# Patient Record
Sex: Male | Born: 1970 | Race: Black or African American | Hispanic: No | Marital: Single | Smoking: Never smoker
Health system: Southern US, Community
[De-identification: ages and names within clinical notes are randomized; demographics above are authoritative.]

## PROBLEM LIST (undated history)

## (undated) DIAGNOSIS — E785 Hyperlipidemia, unspecified: Secondary | ICD-10-CM

## (undated) DIAGNOSIS — G709 Myoneural disorder, unspecified: Secondary | ICD-10-CM

## (undated) DIAGNOSIS — E039 Hypothyroidism, unspecified: Secondary | ICD-10-CM

## (undated) DIAGNOSIS — E559 Vitamin D deficiency, unspecified: Secondary | ICD-10-CM

## (undated) DIAGNOSIS — E11359 Type 2 diabetes mellitus with proliferative diabetic retinopathy without macular edema: Secondary | ICD-10-CM

## (undated) DIAGNOSIS — E109 Type 1 diabetes mellitus without complications: Secondary | ICD-10-CM

## (undated) DIAGNOSIS — E291 Testicular hypofunction: Secondary | ICD-10-CM

## (undated) DIAGNOSIS — I10 Essential (primary) hypertension: Secondary | ICD-10-CM

## (undated) DIAGNOSIS — G35 Multiple sclerosis: Secondary | ICD-10-CM

## (undated) HISTORY — DX: Type 1 diabetes mellitus without complications: E10.9

## (undated) HISTORY — DX: Hypothyroidism, unspecified: E03.9

## (undated) HISTORY — DX: Hyperlipidemia, unspecified: E78.5

## (undated) HISTORY — DX: Multiple sclerosis: G35

## (undated) HISTORY — DX: Myoneural disorder, unspecified: G70.9

## (undated) HISTORY — DX: Essential (primary) hypertension: I10

## (undated) HISTORY — PX: FRACTURE SURGERY: SHX138

## (undated) HISTORY — DX: Type 2 diabetes mellitus with proliferative diabetic retinopathy without macular edema: E11.359

## (undated) HISTORY — PX: EYE SURGERY: SHX253

## (undated) HISTORY — PX: RETINAL LASER PROCEDURE: SHX2339

---

## 1999-06-18 ENCOUNTER — Emergency Department (HOSPITAL_COMMUNITY): Admission: EM | Admit: 1999-06-18 | Discharge: 1999-06-18 | Payer: Self-pay | Admitting: Emergency Medicine

## 2001-01-24 ENCOUNTER — Inpatient Hospital Stay (HOSPITAL_COMMUNITY): Admission: EM | Admit: 2001-01-24 | Discharge: 2001-01-25 | Payer: Self-pay | Admitting: Emergency Medicine

## 2004-04-14 ENCOUNTER — Ambulatory Visit: Payer: Self-pay | Admitting: Family Medicine

## 2004-04-18 ENCOUNTER — Encounter: Admission: RE | Admit: 2004-04-18 | Discharge: 2004-04-18 | Payer: Self-pay | Admitting: Family Medicine

## 2004-05-05 ENCOUNTER — Ambulatory Visit (HOSPITAL_COMMUNITY): Admission: RE | Admit: 2004-05-05 | Discharge: 2004-05-05 | Payer: Self-pay | Admitting: Neurology

## 2004-09-15 ENCOUNTER — Ambulatory Visit: Payer: Self-pay | Admitting: Internal Medicine

## 2005-07-20 ENCOUNTER — Emergency Department (HOSPITAL_COMMUNITY): Admission: EM | Admit: 2005-07-20 | Discharge: 2005-07-20 | Payer: Self-pay | Admitting: Emergency Medicine

## 2005-08-17 ENCOUNTER — Ambulatory Visit: Payer: Self-pay | Admitting: Endocrinology

## 2005-09-03 ENCOUNTER — Ambulatory Visit: Payer: Self-pay | Admitting: Endocrinology

## 2005-09-12 ENCOUNTER — Ambulatory Visit: Payer: Self-pay | Admitting: Endocrinology

## 2005-09-14 ENCOUNTER — Ambulatory Visit: Payer: Self-pay | Admitting: Endocrinology

## 2005-12-28 ENCOUNTER — Ambulatory Visit: Payer: Self-pay | Admitting: Endocrinology

## 2006-01-29 ENCOUNTER — Ambulatory Visit: Payer: Self-pay | Admitting: Endocrinology

## 2006-01-31 ENCOUNTER — Ambulatory Visit: Payer: Self-pay | Admitting: Internal Medicine

## 2006-05-24 ENCOUNTER — Ambulatory Visit: Payer: Self-pay | Admitting: Endocrinology

## 2006-05-24 LAB — CONVERTED CEMR LAB
ALT: 83 units/L — ABNORMAL HIGH (ref 0–40)
AST: 63 units/L — ABNORMAL HIGH (ref 0–37)
Albumin: 4.1 g/dL (ref 3.5–5.2)
Alkaline Phosphatase: 92 units/L (ref 39–117)
BUN: 18 mg/dL (ref 6–23)
Bacteria, U Microscopic: NEGATIVE /hpf
Basophils Absolute: 0.1 10*3/uL (ref 0.0–0.1)
Basophils Relative: 0.7 % (ref 0.0–1.0)
Bilirubin Urine: NEGATIVE
CO2: 29 meq/L (ref 19–32)
Calcium: 9.7 mg/dL (ref 8.4–10.5)
Chloride: 101 meq/L (ref 96–112)
Chol/HDL Ratio, serum: 2.6
Cholesterol: 137 mg/dL (ref 0–200)
Creatinine, Ser: 1.3 mg/dL (ref 0.4–1.5)
Creatinine,U: 119.9 mg/dL
Crystals: NEGATIVE
Eosinophil percent: 9.1 % — ABNORMAL HIGH (ref 0.0–5.0)
Epithelial cells, urine: NEGATIVE /lpf
GFR calc non Af Amer: 68 mL/min
Glomerular Filtration Rate, Af Am: 82 mL/min/{1.73_m2}
Glucose, Bld: 147 mg/dL — ABNORMAL HIGH (ref 70–99)
HCT: 42.3 % (ref 39.0–52.0)
HDL: 52.2 mg/dL (ref >39.0)
Hemoglobin, Urine: NEGATIVE
Hemoglobin: 14.5 g/dL (ref 13.0–17.0)
Hgb A1c MFr Bld: 10.2 % — ABNORMAL HIGH (ref 4.6–6.0)
Ketones, ur: NEGATIVE mg/dL
LDL Cholesterol: 74 mg/dL (ref 0–99)
Leukocytes, UA: NEGATIVE
Lymphocytes Relative: 18.4 % (ref 12.0–46.0)
MCHC: 34.4 g/dL (ref 30.0–36.0)
MCV: 86.1 fL (ref 78.0–100.0)
Microalb Creat Ratio: 190.2 mg/g — ABNORMAL HIGH (ref 0.0–30.0)
Microalb, Ur: 22.8 mg/dL — ABNORMAL HIGH (ref 0.0–1.9)
Monocytes Absolute: 0.7 10*3/uL (ref 0.2–0.7)
Monocytes Relative: 8.6 % (ref 3.0–11.0)
Mucus, UA: NEGATIVE
Neutro Abs: 5.2 10*3/uL (ref 1.4–7.7)
Neutrophils Relative %: 63.2 % (ref 43.0–77.0)
Nitrite: NEGATIVE
Platelets: 232 10*3/uL (ref 150–400)
Potassium: 4.8 meq/L (ref 3.5–5.1)
RBC / HPF: NONE SEEN
RBC: 4.91 M/uL (ref 4.22–5.81)
RDW: 12.3 % (ref 11.5–14.6)
Sodium: 138 meq/L (ref 135–145)
Specific Gravity, Urine: 1.015 (ref 1.000–1.03)
TSH: 11.21 microintl units/mL — ABNORMAL HIGH (ref 0.35–5.50)
Total Bilirubin: 0.7 mg/dL (ref 0.3–1.2)
Total Protein, Urine: 30 mg/dL — AB
Total Protein: 7.2 g/dL (ref 6.0–8.3)
Triglyceride fasting, serum: 55 mg/dL (ref 0–149)
Urine Glucose: NEGATIVE mg/dL
Urobilinogen, UA: 0.2 (ref 0.0–1.0)
VLDL: 11 mg/dL (ref 0–40)
WBC: 8.3 10*3/uL (ref 4.5–10.5)
pH: 6 (ref 5.0–8.0)

## 2006-05-30 ENCOUNTER — Ambulatory Visit: Payer: Self-pay | Admitting: Endocrinology

## 2006-07-31 ENCOUNTER — Ambulatory Visit: Payer: Self-pay | Admitting: Endocrinology

## 2006-07-31 LAB — CONVERTED CEMR LAB
HCV Ab: NEGATIVE
Hepatitis B Surface Ag: NEGATIVE
TSH: 9.69 microintl units/mL — ABNORMAL HIGH (ref 0.35–5.50)

## 2006-08-02 ENCOUNTER — Ambulatory Visit: Payer: Self-pay | Admitting: Endocrinology

## 2006-11-16 ENCOUNTER — Emergency Department (HOSPITAL_COMMUNITY): Admission: EM | Admit: 2006-11-16 | Discharge: 2006-11-16 | Payer: Self-pay | Admitting: Emergency Medicine

## 2006-11-20 ENCOUNTER — Encounter: Admission: RE | Admit: 2006-11-20 | Discharge: 2006-11-20 | Payer: Self-pay | Admitting: Urology

## 2006-12-19 ENCOUNTER — Ambulatory Visit: Payer: Self-pay | Admitting: Endocrinology

## 2006-12-19 LAB — CONVERTED CEMR LAB: Hgb A1c MFr Bld: 11.2 % — ABNORMAL HIGH (ref 4.6–6.0)

## 2006-12-25 ENCOUNTER — Encounter: Payer: Self-pay | Admitting: Endocrinology

## 2006-12-25 DIAGNOSIS — I10 Essential (primary) hypertension: Secondary | ICD-10-CM | POA: Insufficient documentation

## 2007-01-13 ENCOUNTER — Ambulatory Visit (HOSPITAL_COMMUNITY): Admission: RE | Admit: 2007-01-13 | Discharge: 2007-01-13 | Payer: Self-pay | Admitting: Ophthalmology

## 2007-06-15 ENCOUNTER — Emergency Department (HOSPITAL_COMMUNITY): Admission: EM | Admit: 2007-06-15 | Discharge: 2007-06-16 | Payer: Self-pay | Admitting: Emergency Medicine

## 2007-06-16 ENCOUNTER — Encounter: Payer: Self-pay | Admitting: Endocrinology

## 2007-08-29 ENCOUNTER — Ambulatory Visit: Payer: Self-pay | Admitting: Endocrinology

## 2007-08-29 DIAGNOSIS — E11359 Type 2 diabetes mellitus with proliferative diabetic retinopathy without macular edema: Secondary | ICD-10-CM | POA: Insufficient documentation

## 2007-08-29 DIAGNOSIS — G35 Multiple sclerosis: Secondary | ICD-10-CM | POA: Insufficient documentation

## 2007-08-29 DIAGNOSIS — E113599 Type 2 diabetes mellitus with proliferative diabetic retinopathy without macular edema, unspecified eye: Secondary | ICD-10-CM | POA: Insufficient documentation

## 2007-08-29 LAB — CONVERTED CEMR LAB
Cholesterol, target level: 200 mg/dL
HDL goal, serum: 40 mg/dL
LDL Goal: 100 mg/dL

## 2007-11-11 ENCOUNTER — Telehealth: Payer: Self-pay | Admitting: Endocrinology

## 2007-11-12 ENCOUNTER — Ambulatory Visit: Payer: Self-pay | Admitting: Endocrinology

## 2007-11-12 DIAGNOSIS — E7800 Pure hypercholesterolemia, unspecified: Secondary | ICD-10-CM | POA: Insufficient documentation

## 2007-11-12 DIAGNOSIS — E039 Hypothyroidism, unspecified: Secondary | ICD-10-CM | POA: Insufficient documentation

## 2007-11-12 LAB — CONVERTED CEMR LAB
ALT: 25 units/L (ref 0–53)
AST: 30 units/L (ref 0–37)
Albumin: 4 g/dL (ref 3.5–5.2)
Alkaline Phosphatase: 75 units/L (ref 39–117)
BUN: 18 mg/dL (ref 6–23)
Bilirubin, Direct: 0.2 mg/dL (ref 0.0–0.3)
CO2: 29 meq/L (ref 19–32)
Calcium: 9.4 mg/dL (ref 8.4–10.5)
Chloride: 102 meq/L (ref 96–112)
Cholesterol: 143 mg/dL (ref 0–200)
Creatinine, Ser: 1.1 mg/dL (ref 0.4–1.5)
GFR calc Af Amer: 98 mL/min
GFR calc non Af Amer: 81 mL/min
Glucose, Bld: 281 mg/dL — ABNORMAL HIGH (ref 70–99)
HDL: 43.6 mg/dL (ref >39.0)
Hgb A1c MFr Bld: 11.5 % — ABNORMAL HIGH (ref 4.6–6.0)
LDL Cholesterol: 90 mg/dL (ref 0–99)
Potassium: 4.4 meq/L (ref 3.5–5.1)
Sodium: 138 meq/L (ref 135–145)
TSH: 3.88 microintl units/mL (ref 0.35–5.50)
Total Bilirubin: 0.9 mg/dL (ref 0.3–1.2)
Total CHOL/HDL Ratio: 3.3
Total Protein: 7.2 g/dL (ref 6.0–8.3)
Triglycerides: 48 mg/dL (ref 0–149)
VLDL: 10 mg/dL (ref 0–40)

## 2007-12-03 ENCOUNTER — Ambulatory Visit: Payer: Self-pay | Admitting: Endocrinology

## 2007-12-03 DIAGNOSIS — E1065 Type 1 diabetes mellitus with hyperglycemia: Secondary | ICD-10-CM | POA: Insufficient documentation

## 2007-12-03 DIAGNOSIS — IMO0002 Reserved for concepts with insufficient information to code with codable children: Secondary | ICD-10-CM | POA: Insufficient documentation

## 2008-03-26 ENCOUNTER — Encounter: Payer: Self-pay | Admitting: Endocrinology

## 2008-04-07 ENCOUNTER — Ambulatory Visit: Payer: Self-pay | Admitting: Endocrinology

## 2008-04-07 LAB — CONVERTED CEMR LAB: Hgb A1c MFr Bld: 11.2 % — ABNORMAL HIGH (ref 4.6–6.0)

## 2008-05-06 ENCOUNTER — Encounter: Payer: Self-pay | Admitting: Endocrinology

## 2008-05-07 ENCOUNTER — Telehealth: Payer: Self-pay | Admitting: Endocrinology

## 2008-08-10 ENCOUNTER — Encounter (HOSPITAL_COMMUNITY): Admission: RE | Admit: 2008-08-10 | Discharge: 2008-11-08 | Payer: Self-pay | Admitting: Urology

## 2008-08-16 ENCOUNTER — Ambulatory Visit: Payer: Self-pay | Admitting: Endocrinology

## 2008-08-17 ENCOUNTER — Telehealth (INDEPENDENT_AMBULATORY_CARE_PROVIDER_SITE_OTHER): Payer: Self-pay | Admitting: *Deleted

## 2008-08-18 ENCOUNTER — Encounter: Payer: Self-pay | Admitting: Endocrinology

## 2008-11-30 ENCOUNTER — Telehealth (INDEPENDENT_AMBULATORY_CARE_PROVIDER_SITE_OTHER): Payer: Self-pay | Admitting: *Deleted

## 2008-12-27 ENCOUNTER — Ambulatory Visit: Payer: Self-pay | Admitting: Endocrinology

## 2008-12-27 DIAGNOSIS — M25519 Pain in unspecified shoulder: Secondary | ICD-10-CM | POA: Insufficient documentation

## 2008-12-29 ENCOUNTER — Telehealth: Payer: Self-pay | Admitting: Endocrinology

## 2009-01-07 ENCOUNTER — Telehealth: Payer: Self-pay | Admitting: Endocrinology

## 2009-01-25 ENCOUNTER — Telehealth: Payer: Self-pay | Admitting: Endocrinology

## 2009-02-21 ENCOUNTER — Encounter: Payer: Self-pay | Admitting: Endocrinology

## 2009-03-03 ENCOUNTER — Encounter: Payer: Self-pay | Admitting: Endocrinology

## 2009-07-01 ENCOUNTER — Telehealth: Payer: Self-pay | Admitting: Endocrinology

## 2009-08-22 ENCOUNTER — Ambulatory Visit: Payer: Self-pay | Admitting: Endocrinology

## 2009-08-22 LAB — CONVERTED CEMR LAB: Hgb A1c MFr Bld: 11.3 % — ABNORMAL HIGH (ref 4.6–6.5)

## 2009-09-09 ENCOUNTER — Ambulatory Visit: Payer: Self-pay | Admitting: Endocrinology

## 2009-10-07 ENCOUNTER — Ambulatory Visit: Payer: Self-pay | Admitting: Endocrinology

## 2009-10-07 DIAGNOSIS — R21 Rash and other nonspecific skin eruption: Secondary | ICD-10-CM | POA: Insufficient documentation

## 2009-10-18 ENCOUNTER — Encounter: Admission: RE | Admit: 2009-10-18 | Discharge: 2009-10-18 | Payer: Self-pay | Admitting: Endocrinology

## 2009-10-25 ENCOUNTER — Encounter: Payer: Self-pay | Admitting: Endocrinology

## 2009-10-25 ENCOUNTER — Emergency Department (HOSPITAL_COMMUNITY): Admission: EM | Admit: 2009-10-25 | Discharge: 2009-10-25 | Payer: Self-pay | Admitting: Emergency Medicine

## 2009-11-22 ENCOUNTER — Encounter: Payer: Self-pay | Admitting: Endocrinology

## 2009-12-14 ENCOUNTER — Encounter: Payer: Self-pay | Admitting: Endocrinology

## 2010-06-25 LAB — CONVERTED CEMR LAB
ALT: 31 units/L (ref 0–53)
AST: 32 units/L (ref 0–37)
Albumin: 4.3 g/dL (ref 3.5–5.2)
Alkaline Phosphatase: 74 units/L (ref 39–117)
BUN: 19 mg/dL (ref 6–23)
Bilirubin Urine: NEGATIVE
Bilirubin, Direct: 0.2 mg/dL (ref 0.0–0.3)
CO2: 33 meq/L — ABNORMAL HIGH (ref 19–32)
Calcium: 9.5 mg/dL (ref 8.4–10.5)
Chloride: 104 meq/L (ref 96–112)
Cholesterol: 132 mg/dL (ref 0–200)
Creatinine, Ser: 1.2 mg/dL (ref 0.4–1.5)
Creatinine,U: 166.4 mg/dL
GFR calc non Af Amer: 87.91 mL/min (ref >60)
Glucose, Bld: 192 mg/dL — ABNORMAL HIGH (ref 70–99)
HDL: 43.6 mg/dL (ref >39.00)
Hemoglobin, Urine: NEGATIVE
Hgb A1c MFr Bld: 10.3 % — ABNORMAL HIGH (ref 4.6–6.5)
Ketones, ur: NEGATIVE mg/dL
LDL Cholesterol: 82 mg/dL (ref 0–99)
Leukocytes, UA: NEGATIVE
Microalb Creat Ratio: 31.3 mg/g — ABNORMAL HIGH (ref 0.0–30.0)
Microalb, Ur: 5.2 mg/dL — ABNORMAL HIGH (ref 0.0–1.9)
Nitrite: NEGATIVE
Potassium: 4.5 meq/L (ref 3.5–5.1)
Sodium: 142 meq/L (ref 135–145)
Specific Gravity, Urine: 1.02 (ref 1.000–1.030)
TSH: 4.29 microintl units/mL (ref 0.35–5.50)
Total Bilirubin: 0.7 mg/dL (ref 0.3–1.2)
Total CHOL/HDL Ratio: 3
Total Protein, Urine: NEGATIVE mg/dL
Total Protein: 7.3 g/dL (ref 6.0–8.3)
Triglycerides: 33 mg/dL (ref 0.0–149.0)
Urine Glucose: >=1000 mg/dL
Urobilinogen, UA: 1 (ref 0.0–1.0)
VLDL: 6.6 mg/dL (ref 0.0–40.0)
pH: 6.5 (ref 5.0–8.0)

## 2010-06-29 NOTE — Assessment & Plan Note (Signed)
Summary: FU ON INSULIN /NWS   Vital Signs:  Patient profile:   40 year old male Height:      71 inches (180.34 cm) Weight:      193.25 pounds (87.84 kg) BMI:     27.05 O2 Sat:      98 % on Room air Temp:     98.9 degrees F (37.17 degrees C) oral Pulse rate:   82 / minute BP sitting:   110 / 72  (left arm) Cuff size:   large  Vitals Entered By: [name] RMA (Oct 07, 2009 8:01 AM)  O2 Flow:  Room air CC: Follow-up visit on insulin/ CF Is Patient Diabetic? Yes   CC:  Follow-up visit on insulin/ CF.  History of Present Illness: pt states he feels well in general.  he brings a record of his cbg's which i have reviewed today.  he has mild hypoglycemia approx 2/week--it happens when he misses a meal, or with activity in his yard.  pt states he feels well in general, except for fatigue. nodule on the left arm persists.  Current Medications (verified): 1)  Pravachol 40 Mg  Tabs (Pravastatin Sodium) .... 2 Qd 2)  Synthroid 50 Mcg  Tabs (Levothyroxine Sodium) .... Qd 3)  Zestoretic 20-12.5 Mg  Tabs (Lisinopril-Hydrochlorothiazide) .... Qd 4)  One Touch Glucose Test Strips, and Lancets .... Qid 5)  Lidex 0.05 % Crea (Fluocinonide) .... Three Times A Day As Needed Rash 6)  Prestige Blood Glucose Monitor  Misc (Blood Glucose Monitoring Suppl) .... As Dir 7)  Prestige Test  Strp (Glucose Blood) .... Two Times A Day, and Lancets 250.01 8)  Bd Insulin Syringe Ultrafine 31g X 5/16" 0.3 Ml Misc (Insulin Syringe-Needle U-100) .Marland Kitchen.. 1 Syringe Three Times A Day 9)  Prodigy No Coding Blood Gluc  Strp (Glucose Blood) .... Use As Directed 10)  Levemir 100 Unit/ml Soln (Insulin Detemir) .... 55 Units Each Am  Allergies (verified): No Known Drug Allergies  Past History:  Past Medical History: Last updated: 12/03/2007 DM Nepharopathy ABNL CXR HEPATOTOXICITY, DRUG-INDUCED, RISK OF (ICD-V58.69) HYPERCHOLESTEROLEMIA (ICD-272.0) HYPOTHYROIDISM (ICD-244.9) PROLIFERATIVE DIABETIC RETINOPATHY  (ICD-362.02) MULTIPLE SCLEROSIS (ICD-340) HYPERTENSION (ICD-401.9) DIABETES MELLITUS, TYPE I (ICD-250.01)  Review of Systems  The patient denies syncope.    Physical Exam  General:  normal appearance.   Psych:  Alert and cooperative; normal mood and affect; normal attention span and concentration.     Impression & Recommendations:  Problem # 1:  DIABETES MELLITUS, TYPE I, UNCONTROLLED, W/RENAL COMPS (ICD-250.43) he needs a variable dosage to meet his changing needs, especially due to exercise.  Problem # 2:  RASH AND OTHER NONSPECIFIC SKIN ERUPTION (ICD-782.1)  Medications Added to Medication List This Visit: 1)  Levemir 100 Unit/ml Soln (Insulin detemir) .... 60 units each am  Other Orders: Est. Patient Level III (16109) Dermatology Referral (Derma)  Patient Instructions: 1)  change levemir to 60 units each am.  however, take only 50 units if you are going to be active that day. 2)  Please schedule a follow-up appointment in 1 month. 3)  refer derm

## 2010-06-29 NOTE — Letter (Signed)
[facility]   Imported By: Lester [place] 11/02/2009 07:58:48  _____________________________________________________________________  External Attachment:    Type:   Image     Comment:   External Document

## 2010-06-29 NOTE — Letter (Signed)
Summary: [facility]  [facility]   Imported By: Lester [place] 12/01/2009 09:55:05  _____________________________________________________________________  External Attachment:    Type:   Image     Comment:   External Document

## 2010-06-29 NOTE — Assessment & Plan Note (Signed)
Summary: FU / EAR INFECTION? /NWS   Vital Signs:  Patient profile:   40 year old male Height:      71 inches (180.34 cm) Weight:      203.50 pounds (92.50 kg) O2 Sat:      98 % on Room air Temp:     98.0 degrees F (36.67 degrees C) oral Pulse rate:   77 / minute BP sitting:   124 / 60  (left arm) Cuff size:   regular  Vitals Entered ByMarland Kitchen Orlan Leavens (August 22, 2009 3:13 PM)Sherese Christopher SMA  O2 Flow:  Room air CC: Follow up/possible ear infection (R)//[place]   CC:  Follow up/possible ear infection (R)//[place].  History of Present Illness: pt states moderate pain at the right ear x 2 weeks.  no associated nasal congestion.   no cbg record, but states cbg's are low approx 3/month.   it happens during the day, after a small meal.  it is highest in am.  Current Medications (verified): 1)  Pravachol 40 Mg  Tabs (Pravastatin Sodium) .... 2 Qd 2)  Synthroid 50 Mcg  Tabs (Levothyroxine Sodium) .... Qd 3)  Zestoretic 20-12.5 Mg  Tabs (Lisinopril-Hydrochlorothiazide) .... Qd 4)  One Touch Glucose Test Strips, and Lancets .... Qid 5)  Lidex 0.05 % Crea (Fluocinonide) .... Three Times A Day As Needed Rash 6)  Humalog Mix 75/25 75-25 % Susp (Insulin Lispro Prot & Lispro) .... 45 Units Qam,   10 Units Qpm 7)  Prestige Blood Glucose Monitor  Misc (Blood Glucose Monitoring Suppl) .... As Dir 8)  Prestige Test  Strp (Glucose Blood) .... Two Times A Day, and Lancets 250.01 9)  Bd Insulin Syringe Ultrafine 31g X 5/16" 0.3 Ml Misc (Insulin Syringe-Needle U-100) .Marland Kitchen.. 1 Syringe Three Times A Day  Allergies (verified): No Known Drug Allergies  Past History:  Past Medical History: Last updated: 12/03/2007 DM Nepharopathy ABNL CXR HEPATOTOXICITY, DRUG-INDUCED, RISK OF (ICD-V58.69) HYPERCHOLESTEROLEMIA (ICD-272.0) HYPOTHYROIDISM (ICD-244.9) PROLIFERATIVE DIABETIC RETINOPATHY (ICD-362.02) MULTIPLE SCLEROSIS (ICD-340) HYPERTENSION (ICD-401.9) DIABETES MELLITUS, TYPE I (ICD-250.01)  Review of  Systems  The patient denies dyspnea on exertion.         denies sore throat.  Physical Exam  General:  normal appearance.   Head:  head: no deformity eyes: no periorbital swelling, no proptosis external nose and ears are normal mouth: no lesion seen Ears:  both tm's are mildly red Additional Exam:  Hemoglobin A1C       [H]  11.3 %    Impression & Recommendations:  Problem # 1:  DIABETES MELLITUS, TYPE I, UNCONTROLLED, W/RENAL COMPS (ICD-250.43) poor control.  he may do better with a simpler regimen  Problem # 2:  uri new  Medications Added to Medication List This Visit: 1)  Humalog Mix 75/25 75-25 % Susp (Insulin lispro prot & lispro) .... 40 units qam,   20 units qpm 2)  Azithromycin 500 Mg Tabs (Azithromycin) .Marland Kitchen.. 1 once daily  Other Orders: Diabetic Clinic Referral (Diabetic) TLB-A1C / Hgb A1C (Glycohemoglobin) (83036-A1C) Est. Patient Level IV (16109)  Patient Instructions: 1)  azithromycin 500 mg once daily 2)  change humalog 75/25 to 40 units am and 20 units before supper. 3)  check your blood sugar 2 times a day.  vary the time of day when you check, between before the 3 meals, and at bedtime.  also check if you have symptoms of your blood sugar being too high or too low.  please keep a record of the readings and  bring it to your next appointment here.  please call us sooner if you are having low blood sugar episodes. 4)  tests are being ordered for you today.  a few days after the test(s), please call [phone] to hear your test results.  5)  Please schedule a follow-up appointment in 3 months. 6)  (update: i left message on phone-tree:  i offered change to qam levemir.  ret few weeks with cbg record, if you decide to change insulin or not). 7)  refer dietician Prescriptions: AZITHROMYCIN 500 MG TABS (AZITHROMYCIN) 1 once daily  #6 x 0   Entered and Authorized by:   Minus Breeding MD   Signed by:   Minus Breeding MD on 08/22/2009   Method used:   Electronically to         [facility] [phone]* (retail)       [address]       [place], [place]  [address]       Ph: [phone]       Fax: [phone]   RxID:   [id number]
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
## 2010-06-29 NOTE — Letter (Signed)
Summary: [facility]  [facility]   Imported By: Lester  12/28/2009 08:41:23  _____________________________________________________________________  External Attachment:    Type:   Image     Comment:   External Document

## 2010-06-29 NOTE — Progress Notes (Signed)
  Phone Note Refill Request Message from:  Fax from Pharmacy on July 01, 2009 1:16 PM  Refills Requested: Medication #1:  PRAVACHOL 40 MG  TABS 2 qd   Dosage confirmed as above?Dosage Confirmed Initial call taken by: [name] CMA,  July 01, 2009 1:16 PM    Prescriptions: PRAVACHOL 40 MG  TABS (PRAVASTATIN SODIUM) 2 qd  #90 Each x 0   Entered by:   [name] CMA   Authorized by:   Minus Breeding MD   Signed by:   [name] CMA on 07/01/2009   Method used:   Electronically to        [facility] [phone]* (retail)       [address]       [place], [place]  [address]       Ph: [phone]       Fax: [phone]   RxID:   [id number]

## 2010-06-29 NOTE — Assessment & Plan Note (Signed)
Summary: FOLLOW UP-LB  #   Vital Signs:  Patient profile:   40 year old male Height:      71 inches (180.34 cm) Weight:      200.25 pounds (91.02 kg) O2 Sat:      98 % on Room air Temp:     96.9 degrees F (36.06 degrees C) oral Pulse rate:   86 / minute BP sitting:   118 / 82  (left arm) Cuff size:   regular  Vitals Entered By: [name] RMA (September 09, 2009 8:01 AM)  O2 Flow:  Room air CC: Follow-up visit/ pt states he is done with the Azithromycin/ CF Is Patient Diabetic? Yes   CC:  Follow-up visit/ pt states he is done with the Azithromycin/ CF.  History of Present Illness: pt states he feels well in general.  he brings a record of his cbg's which i have reviewed today.  it is extremely variable, with no trend throughout the day.    Current Medications (verified): 1)  Pravachol 40 Mg  Tabs (Pravastatin Sodium) .... 2 Qd 2)  Synthroid 50 Mcg  Tabs (Levothyroxine Sodium) .... Qd 3)  Zestoretic 20-12.5 Mg  Tabs (Lisinopril-Hydrochlorothiazide) .... Qd 4)  One Touch Glucose Test Strips, and Lancets .... Qid 5)  Lidex 0.05 % Crea (Fluocinonide) .... Three Times A Day As Needed Rash 6)  Humalog Mix 75/25 75-25 % Susp (Insulin Lispro Prot & Lispro) .... 40 Units Qam,   20 Units Qpm 7)  Prestige Blood Glucose Monitor  Misc (Blood Glucose Monitoring Suppl) .... As Dir 8)  Prestige Test  Strp (Glucose Blood) .... Two Times A Day, and Lancets 250.01 9)  Bd Insulin Syringe Ultrafine 31g X 5/16" 0.3 Ml Misc (Insulin Syringe-Needle U-100) .Marland Kitchen.. 1 Syringe Three Times A Day 10)  Azithromycin 500 Mg Tabs (Azithromycin) .Marland Kitchen.. 1 Once Daily 11)  Prodigy No Coding Blood Gluc  Strp (Glucose Blood) .... Use As Directed  Allergies (verified): No Known Drug Allergies  Past History:  Past Medical History: Last updated: 12/03/2007 DM Nepharopathy ABNL CXR HEPATOTOXICITY, DRUG-INDUCED, RISK OF (ICD-V58.69) HYPERCHOLESTEROLEMIA (ICD-272.0) HYPOTHYROIDISM (ICD-244.9) PROLIFERATIVE DIABETIC  RETINOPATHY (ICD-362.02) MULTIPLE SCLEROSIS (ICD-340) HYPERTENSION (ICD-401.9) DIABETES MELLITUS, TYPE I (ICD-250.01)  Social History: Reviewed history from 08/29/2007 and no changes required. single  unemployed was declared disabled 2011  Review of Systems  The patient denies syncope.    Physical Exam  General:  normal appearance.   Psych:  Alert and cooperative; normal mood and affect; normal attention span and concentration.     Impression & Recommendations:  Problem # 1:  DIABETES MELLITUS, TYPE I, UNCONTROLLED, W/RENAL COMPS (ICD-250.43) he needs a simpler regimen  Medications Added to Medication List This Visit: 1)  Levemir 100 Unit/ml Soln (Insulin detemir) .... 55 units each am  Other Orders: Est. Patient Level III (78295)  Patient Instructions: 1)  change current insulin to levemir 55 units each am. 2)  check your blood sugar 2 times a day.  vary the time of day when you check, between before the 3 meals, and at bedtime.  also check if you have symptoms of your blood sugar being too high or too low.  please keep a record of the readings and bring it to your next appointment here.  please call us sooner if you are having low blood sugar episodes. 3)  Please schedule a follow-up appointment in 3 weeks. Prescriptions: LEVEMIR 100 UNIT/ML SOLN (INSULIN DETEMIR) 55 units each am  #2 vials x 11  Entered and Authorized by:   Minus Breeding MD   Signed by:   Minus Breeding MD on 09/09/2009   Method used:   Electronically to        [facility] [phone]* (retail)       [address]       [place], [place]  [address]       Ph: [phone]       Fax: [phone]   RxID:   [id number]

## 2010-06-29 NOTE — Letter (Signed)
Summary: [facility]  [facility]   Imported By: Lester [place] 09/01/2009 10:58:34  _____________________________________________________________________  External Attachment:    Type:   Image     Comment:   External Document

## 2010-08-26 ENCOUNTER — Other Ambulatory Visit: Payer: Self-pay | Admitting: Endocrinology

## 2010-09-18 ENCOUNTER — Inpatient Hospital Stay (INDEPENDENT_AMBULATORY_CARE_PROVIDER_SITE_OTHER)
Admission: RE | Admit: 2010-09-18 | Discharge: 2010-09-18 | Disposition: A | Discharge status: Home / Self Care | Payer: Medicare Other | Source: Ambulatory Visit | Attending: Family Medicine | Admitting: Family Medicine

## 2010-09-18 ENCOUNTER — Ambulatory Visit (INDEPENDENT_AMBULATORY_CARE_PROVIDER_SITE_OTHER): Payer: Medicare Other

## 2010-09-18 DIAGNOSIS — S20229A Contusion of unspecified back wall of thorax, initial encounter: Secondary | ICD-10-CM

## 2010-09-21 ENCOUNTER — Inpatient Hospital Stay (INDEPENDENT_AMBULATORY_CARE_PROVIDER_SITE_OTHER)
Admission: RE | Admit: 2010-09-21 | Discharge: 2010-09-21 | Disposition: A | Discharge status: Home / Self Care | Payer: Medicare Other | Source: Ambulatory Visit | Attending: Emergency Medicine | Admitting: Emergency Medicine

## 2010-09-21 DIAGNOSIS — S298XXA Other specified injuries of thorax, initial encounter: Secondary | ICD-10-CM

## 2010-10-10 NOTE — Op Note (Signed)
Johnathan Ross, Johnathan Ross              ACCOUNT NO.:  [id number]   MEDICAL RECORD NO.:  [id number]          PATIENT TYPE:  AMB   LOCATION:  SDS                          FACILITY:  [facility]   PHYSICIAN:  Alford Highland. Rankin, M.D.   DATE OF BIRTH:  June 24, 1970   DATE OF PROCEDURE:  01/13/2007  DATE OF DISCHARGE:                               OPERATIVE REPORT   PREOPERATIVE DIAGNOSIS:  1. Retinal detachment, left eye.  2. Rhegmatogenous detachment, left eye.  3. Epiretinal membrane, left eye.  4. Progressive proliferative diabetic retinopathy, left eye.   POSTOPERATIVE DIAGNOSIS:  1. Retinal detachment, left eye.  2. Rhegmatogenous detachment, left eye.  3. Epiretinal membrane, left eye.  4. Progressive proliferative diabetic retinopathy, left eye.   PROCEDURE PERFORMED:  1. Posterior vitrectomy with membrane peel --  epiretinal membrane --      25-gauge, left eye.  2. Endolaser and pan photocoagulation, left eye.  3. Injection of vitreous substitute to repair retinal detachment --      SF6 20%.   SURGEON:  Alford Highland. Rankin, M.D.   ANESTHESIA:  General endotracheal anesthesia.   INDICATIONS FOR PROCEDURE:  The patient is a 40 year old man with  profound vision loss on the basis of progressive proliferative diabetic  retinopathy.  The patient understands he has progressive loss of vision,  tractional detachment combined with rhegmatogenous detachment nasally  with threatening of complete vision loss in this left eye.  He  understands the risks of anesthesia, including the rare occurrence of  death, loss of the eye, including, but not limited to, hemorrhage,  infection, scarring, need for further surgery, no change in vision, loss  of vision, progressive disease despite intervention.  Appropriate signed  consent was obtained.   DESCRIPTION OF PROCEDURE:  The patient was taken to the operating room.  In the operating room appropriate monitors followed by mild sedation.  At this point general  endotracheal anesthesia was instituted without  difficulty.  The  left periocular region was sterilely prepped and  draped in the usual ophthalmic fashion.  A lid speculum was applied.  A  25-gauge trocar was placed in the inferotemporal quadrant.  Superior  trocars were applied.  The core vitrectomy was then begun.  A modified  en bloc technique was then used to remove the hyaloid off the posterior  pole.  The infraretinal membrane was identified on the optic nerve and  nasally.  This was engaged with forceps and this was removed.  The  traction continued, however, into the superonasal quadrant of this left  eye along the 10 o'clock meridian.  Significant epiretinal forces had  pulled the retina into a combined rhegmatogenous detachment.  This  dissection was carried out into the vitreous base.  Excellent  mobilization of the retina was obtained.  Fluid-fluid exchange was  carried out to remove thick subretinal fluid with a hole inferonasal to  the optic nerve.  This was found with the dissection.   At this time fluid-air exchange was completed.  The retina flattened  nicely.  Endolaser photocoagulation was placed 360 degrees and  reaccumulated.  The subretinal  and preretinal fluid was removed on three  occasions.  Excellent retinal reattachment was obtained.  It must be  noted that prior to the fluid-air exchange, that scleral depression had  been used to trim the vitreous base inferiorly and along the edges of  the detachment.   At this time fluid-air exchange was completed.  An air-SF6 20% exchange  was completed.  The superior trocars were removed.  The infusion was  removed.  Subconjunctival Decadron was applied.  Subconjunctival  Decadron was applied inferotemporally.  A skull patch and Fox shield  were applied.  Intraocular pressures were tested and found to be  adequate.  At this time the patient was awakened from anesthesia and  taken to the recovery room in good and stable  condition.      Alford Highland Rankin, M.D.  Electronically Signed     GAR/MEDQ  D:  01/13/2007  T:  01/13/2007  Job:  [id number]

## 2010-10-13 NOTE — Discharge Summary (Signed)
[place]  Patient:    Johnathan Ross, Johnathan Ross Visit Number: [id number] MRN: [id number]          Service Type: MED Location: [id number] Attending Physician:  Rosanne Sack Dictated by:   Anastasio Auerbach, M.D. Admit Date:  01/24/2001 Discharge Date: 01/25/2001   CC:         Kern Reap, M.D., [facility]   Discharge Summary  DATE OF BIRTH:  03/10/1971  DISCHARGE DIAGNOSES: 1. Diabetic ketoacidosis.    a. Secondary to insufficiency (patient ran out).    b. Documented type 1 diabetic. 2. Dehydration, resolved. 3. Acute renal failure secondary to above.    a. Admission BUN 19, creatinine 1.6.    b. Discharge BUN 14, creatinine 1.0. 4. Mildly elevated liver function tests, resolved.    a. Secondary to acute acidosis. 5. Leukocytosis with left shift.    a. No evidence of infection.    b. Secondary to stress of #1.  ALLERGIES:  No known drug allergies.  DISCHARGE MEDICATIONS: 1. Insulin Lantus 44 units in the evening. 2. Insulin Humalog Regular 5 units with breakfast and 10 units with supper.  CONDITION UPON DISCHARGE:  Stable.  Afebrile, blood pressure 133/65, heart rate 74, respiratory rate 18.  DISPOSITION:  Home with family.  ACTIVITY:  Recommend activity as tolerated.  DIET:  An 1800 calorie diabetic diet.  SPECIAL INSTRUCTIONS:  The patient was told to check his sugar twice a day and keep a record for Dr. Barbee Shropshire.  FOLLOWUP:  The patient will be following up with a new primary care physician, Dr. Kern Reap, on Wednesday, September 4 at 2 p.m.  Dr. Barbee Shropshire will review his sugars and establish the patient as a new patient.  HOSPITAL COURSE: #1 - DIABETIC KETOACIDOSIS:  The patient is a 40 year old type 1 diabetic who ran out of insulin 2-1/2 days prior to presenting with nausea and vomiting. He was noted to be acidotic with a bicarb of 14.  He was treated aggressively in emergency room with IV  fluids and was placed on a glucomander.  Within 24 hours, his acidosis had resolved.  We have asked the case managers to get him some insulin to take home with him and have arranged reliable primary care followup.  There was no evidence of infection precipitating this and again, he admits to running out of insulin.  #2 - DEHYDRATION:  The patient actually presented with a creatinine of 2.1. He had been vomiting and nauseated.  The dehydration led to this acute prerenal insufficiency which resolved with IV fluids.  #3 - ABNORMAL LIVER FUNCTION TESTS:  On admission, SGOT was 62, SGPT was 54 and alkaline phosphatase was 127 with a total bilirubin of 2.2.  The following day after a correction of acidosis and IV hydration, all were within normal limits except for an albumin low at 2.8.  DISCHARGE LABORATORY DATA:  Sodium 140, potassium 3.3 (I am going to give him 40 mEq orally prior to discharge).  Chloride 115, bicarb 22, glucose 120, BUN 14, creatinine 1.0, bilirubin 0.8, alkaline phosphatase 83, SGOT 34, SGPT 35, albumin 2.8.  Hemoglobin 13.7, WBC 12,100 (18,500 on admission), platelet count 190. Dictated by:   Anastasio Auerbach, M.D. Attending Physician:  Rosanne Sack DD:  01/25/01 TD:  01/25/01 Job: [id number]

## 2010-10-13 NOTE — H&P (Signed)
[facility]  Patient:    Johnathan Ross, Johnathan Ross Visit Number: [id number] MRN: [id number]          Service Type: Attending:  Rosanne Sack, M.D. Dictated by:   Rosanne Sack, M.D. Adm. Date:  01/24/01   CC:         Kern Reap, M.D. in [facility]   History and Physical  DATE OF BIRTH:  05/04/1971  PROBLEM LIST: 1. Diabetic ketoacidosis secondary to insulin insufficiency and questionable    infectious process. 2. Acute renal failure, probable prerenal azotemia. 3. Dehydration. 4. Abnormal liver function tests, leukocytosis with left shift secondary to    problem #1 versus other etiologies.  CHIEF COMPLAINT:  Nausea, vomiting.  HISTORY OF PRESENT ILLNESS:  Johnathan Ross is a very pleasant 40 year old African-American man who presents with complaints of nausea and vomiting since early this morning.  The patient is a type 1 diabetic who ran out of insulin about two-and-a-half days ago.  About a day ago or so, he had polyuria, polydipsia, nocturia.  The patient was able to feel that his sugar was high. The patient denies fever, chills, cough, malaise, arthralgias, myalgias, diarrhea.  He did not have any abdominal symptom or complaint prior to the onset of nausea and vomiting early this morning.  He did not have any initial foods.  No orthopnea.  No focal weakness.  No chest pain.  No lower extremity swelling.  No hemoptysis, hematemesis, melena, tarry stools, bright red blood per rectum.  No urinary symptoms other than the ones described previously.  ALLERGIES:  None.  PAST MEDICAL HISTORY:  As problem list.  MEDICATIONS: 1. Lantus 44 units subcu q.p.m. 2. Humalog regular insulin 5 units subcu q.a.m., 10 units subcu q.p.m.  SOCIAL HISTORY:  The patient is married and has a two-year-old son.  He does not smoke.  He does not drink alcohol.  He does not use any illegal drugs.  He works for a company in the  [employer] traffic [employer].  FAMILY MEDICAL HISTORY:  The patients sister has type 1 diabetes.  No hypertension, strokes, early coronary artery disease in the family.  The patients grandmother died of pancreatic cancer.  REVIEW OF SYSTEMS:  As HPI.  PHYSICAL EXAMINATION:  VITAL SIGNS:  Temperature 97.4, blood pressure 146/69, heart rate 112, respirations 14, oxygen saturation 100% on room air.  HEENT:  Normocephalic, nontraumatic.  Nonicteric sclerae.  Conjunctivae within the normal limits.  PERRLA, EOMI.  Funduscopic exam negative for papilledema or hemorrhages.  TMs within the normal limits.  Very dry mucous membranes. Oropharynx clear.  NECK:  Supple.  No JVD, no bruits, no adenopathy, no thyromegaly.  LUNGS:  Clear to auscultation bilaterally without crackles, wheezes.  Good air movement bilaterally.  CARDIAC:  Regular, slightly tachycardic.  No S3, no S4, no rubs.  No murmurs.  ABDOMEN:  Flat, nontender, nondistended.  Bowel sounds were present.  No hepatosplenomegaly.  No rebound, no guarding, no masses, no bruits.  GENITOURINARY:  Within the normal limits.  RECTAL:  Empty vault, normal sphincter tone.  EXTREMITIES:  No edema, clubbing, or cyanosis.  Pulses 2+ bilaterally.  NEUROLOGIC:  Alert and oriented x 3.  Strength 5/5 in all extremities.  DTRs 3/5 in all extremities.  Cranial nerves 2-12 intact.  Sensoria intact. Plantar reflexes downgoing bilaterally.  LABORATORY DATA:  Sodium 133, potassium 5.2, chloride 92, CO2 14, BUN 23, creatinine 2.1, glucose 700, total protein 7.3, albumin 4.3, SGOT 62, SGPT  54, alkaline phosphatase 127, total bilirubin 2.2.  Small serum acetone. Hemoglobin 16.0, MCV 83, wbcs 18.5, absolute neutrophil count 16.5, platelets 265.  UA specific gravity 1.029, glucose greater than 1000, ketones greater than 80, negative protein.  ASSESSMENT AND PLAN: 1. Diabetic ketoacidosis - As described in the HPI section, the patient  ran    out of insulin about two-and-a-half days ago.  Twenty-four hours of not    using insulin, the patient started developing clear signs of    hyperglycemia.  At this point, there is no evidence of any infectious    process that could explain this presentation of diabetic ketoacidosis.    Despite the abnormal white cell count and LFTs, which typically are    found in just DKA without abdominal infectious process, I feel that    insulin insufficiency is the cause for this DKA presentation.  Will    admit the patient to a regular bed.  Will start the Glucomander to treat    Johnathan Ross with insulin drip and IV fluids.  Will be repeating a BMET    this afternoon and will recheck again the LFTs and the wbcs tomorrow    morning.  We had a long conversation about the importance of avoiding    situations like the one two-and-a-half days ago.  The patient understood    the importance of getting in touch with a physician or pharmacist if he    were to be about to run out of his normal insulin. 2. Acute renal failure - The patient does not have any previous history of    renal problems.  I suspect prerenal azotemia due to the degree of    dehydration.  Will provide with supportive care and IV fluids and    recheck the renal function in the morning. 3. Dehydration - The patient describes lightheadedness standing up.  The    physical exam is consistent with moderate to severe degree of    dehydration.  Will start IV fluids as described previously.  The fluid    balance, the daily weights, and orthostatics will be monitored. 4. Abdominal liver function tests, leukocytosis with left shift - The    abdominal exam is very benign.  As described in problem #1, I do not    suspect at this point any intra-abdominal infectious process.  This lab    abnormality can be seen just on simple DKAs due to insulin    insufficiency.  Will start feeding the patient and observe clinically.    If the patient were to  develop fever, abdominal pain, or recurrent     nausea or vomiting, further workup will be required with a lipase and an    ultrasound of the abdomen.  There is no need for empiric antibiotic    therapy at this point. Dictated by:   Rosanne Sack, M.D. Attending:  Rosanne Sack, M.D. DD:  01/24/01 TD:  01/24/01 Job: [id number]

## 2011-03-09 LAB — CBC
HCT: 40
Hemoglobin: 13.5
MCHC: 33.7
MCV: 86.1
Platelets: 259
RBC: 4.64
RDW: 14.5 — ABNORMAL HIGH
WBC: 6

## 2011-03-09 LAB — BASIC METABOLIC PANEL
BUN: 14
CO2: 25
Calcium: 9.5
Chloride: 101
Creatinine, Ser: 1.16
GFR calc Af Amer: >60
GFR calc non Af Amer: >60
Glucose, Bld: 211 — ABNORMAL HIGH
Potassium: 4.1
Sodium: 138

## 2011-03-14 LAB — I-STAT 8, (EC8 V) (CONVERTED LAB)
Acid-Base Excess: 1
BUN: 21
Bicarbonate: 26.8 — ABNORMAL HIGH
Chloride: 99
Glucose, Bld: 266 — ABNORMAL HIGH
HCT: 49
Hemoglobin: 16.7
Operator id: 196461
Potassium: 3.8
Sodium: 133 — ABNORMAL LOW
TCO2: 28
pCO2, Ven: 47.6
pH, Ven: 7.359 — ABNORMAL HIGH

## 2011-03-14 LAB — POCT I-STAT CREATININE
Creatinine, Ser: 1.3
Operator id: 196461

## 2011-03-16 ENCOUNTER — Other Ambulatory Visit: Payer: Self-pay | Admitting: Endocrinology

## 2011-05-17 ENCOUNTER — Other Ambulatory Visit: Payer: Self-pay | Admitting: Endocrinology

## 2011-05-25 ENCOUNTER — Other Ambulatory Visit: Payer: Self-pay | Admitting: Endocrinology

## 2011-06-12 DIAGNOSIS — E7800 Pure hypercholesterolemia, unspecified: Secondary | ICD-10-CM | POA: Diagnosis not present

## 2011-06-12 DIAGNOSIS — E039 Hypothyroidism, unspecified: Secondary | ICD-10-CM | POA: Diagnosis not present

## 2011-06-12 DIAGNOSIS — H353190 Nonexudative age-related macular degeneration, unspecified eye, stage unspecified: Secondary | ICD-10-CM | POA: Diagnosis not present

## 2011-06-12 DIAGNOSIS — E1065 Type 1 diabetes mellitus with hyperglycemia: Secondary | ICD-10-CM | POA: Diagnosis not present

## 2011-06-12 DIAGNOSIS — E11359 Type 2 diabetes mellitus with proliferative diabetic retinopathy without macular edema: Secondary | ICD-10-CM | POA: Diagnosis not present

## 2011-06-12 DIAGNOSIS — IMO0002 Reserved for concepts with insufficient information to code with codable children: Secondary | ICD-10-CM | POA: Diagnosis not present

## 2011-06-25 DIAGNOSIS — E039 Hypothyroidism, unspecified: Secondary | ICD-10-CM | POA: Diagnosis not present

## 2011-06-25 DIAGNOSIS — E7800 Pure hypercholesterolemia, unspecified: Secondary | ICD-10-CM | POA: Diagnosis not present

## 2011-06-25 DIAGNOSIS — I10 Essential (primary) hypertension: Secondary | ICD-10-CM | POA: Diagnosis not present

## 2011-06-25 DIAGNOSIS — IMO0002 Reserved for concepts with insufficient information to code with codable children: Secondary | ICD-10-CM | POA: Diagnosis not present

## 2011-06-25 DIAGNOSIS — M6281 Muscle weakness (generalized): Secondary | ICD-10-CM | POA: Diagnosis not present

## 2011-06-25 DIAGNOSIS — Z23 Encounter for immunization: Secondary | ICD-10-CM | POA: Diagnosis not present

## 2011-06-25 DIAGNOSIS — E1065 Type 1 diabetes mellitus with hyperglycemia: Secondary | ICD-10-CM | POA: Diagnosis not present

## 2011-07-23 DIAGNOSIS — R5381 Other malaise: Secondary | ICD-10-CM | POA: Diagnosis not present

## 2011-07-23 DIAGNOSIS — G35 Multiple sclerosis: Secondary | ICD-10-CM | POA: Diagnosis not present

## 2011-07-23 DIAGNOSIS — E559 Vitamin D deficiency, unspecified: Secondary | ICD-10-CM | POA: Diagnosis not present

## 2011-07-23 DIAGNOSIS — G253 Myoclonus: Secondary | ICD-10-CM | POA: Diagnosis not present

## 2011-07-23 DIAGNOSIS — R5383 Other fatigue: Secondary | ICD-10-CM | POA: Diagnosis not present

## 2011-07-23 DIAGNOSIS — R42 Dizziness and giddiness: Secondary | ICD-10-CM | POA: Diagnosis not present

## 2011-07-31 DIAGNOSIS — G35 Multiple sclerosis: Secondary | ICD-10-CM | POA: Diagnosis not present

## 2011-08-03 DIAGNOSIS — R269 Unspecified abnormalities of gait and mobility: Secondary | ICD-10-CM | POA: Diagnosis not present

## 2011-08-16 DIAGNOSIS — R269 Unspecified abnormalities of gait and mobility: Secondary | ICD-10-CM | POA: Diagnosis not present

## 2011-08-16 DIAGNOSIS — M6281 Muscle weakness (generalized): Secondary | ICD-10-CM | POA: Diagnosis not present

## 2011-08-16 DIAGNOSIS — IMO0002 Reserved for concepts with insufficient information to code with codable children: Secondary | ICD-10-CM | POA: Diagnosis not present

## 2011-08-16 DIAGNOSIS — E1065 Type 1 diabetes mellitus with hyperglycemia: Secondary | ICD-10-CM | POA: Diagnosis not present

## 2011-08-16 DIAGNOSIS — E7800 Pure hypercholesterolemia, unspecified: Secondary | ICD-10-CM | POA: Diagnosis not present

## 2011-08-21 DIAGNOSIS — M6281 Muscle weakness (generalized): Secondary | ICD-10-CM | POA: Diagnosis not present

## 2011-08-21 DIAGNOSIS — I10 Essential (primary) hypertension: Secondary | ICD-10-CM | POA: Diagnosis not present

## 2011-08-21 DIAGNOSIS — IMO0002 Reserved for concepts with insufficient information to code with codable children: Secondary | ICD-10-CM | POA: Diagnosis not present

## 2011-08-21 DIAGNOSIS — E1065 Type 1 diabetes mellitus with hyperglycemia: Secondary | ICD-10-CM | POA: Diagnosis not present

## 2011-08-21 DIAGNOSIS — E7800 Pure hypercholesterolemia, unspecified: Secondary | ICD-10-CM | POA: Diagnosis not present

## 2011-09-03 DIAGNOSIS — R269 Unspecified abnormalities of gait and mobility: Secondary | ICD-10-CM | POA: Diagnosis not present

## 2011-09-05 DIAGNOSIS — E1065 Type 1 diabetes mellitus with hyperglycemia: Secondary | ICD-10-CM | POA: Diagnosis not present

## 2011-09-05 DIAGNOSIS — R5383 Other fatigue: Secondary | ICD-10-CM | POA: Diagnosis not present

## 2011-09-05 DIAGNOSIS — G35 Multiple sclerosis: Secondary | ICD-10-CM | POA: Diagnosis not present

## 2011-09-05 DIAGNOSIS — IMO0002 Reserved for concepts with insufficient information to code with codable children: Secondary | ICD-10-CM | POA: Diagnosis not present

## 2011-09-05 DIAGNOSIS — R7989 Other specified abnormal findings of blood chemistry: Secondary | ICD-10-CM | POA: Diagnosis not present

## 2011-09-05 DIAGNOSIS — R5381 Other malaise: Secondary | ICD-10-CM | POA: Diagnosis not present

## 2011-09-26 DIAGNOSIS — R7989 Other specified abnormal findings of blood chemistry: Secondary | ICD-10-CM | POA: Diagnosis not present

## 2011-10-30 DIAGNOSIS — E1065 Type 1 diabetes mellitus with hyperglycemia: Secondary | ICD-10-CM | POA: Diagnosis not present

## 2011-10-30 DIAGNOSIS — IMO0002 Reserved for concepts with insufficient information to code with codable children: Secondary | ICD-10-CM | POA: Diagnosis not present

## 2011-11-01 DIAGNOSIS — IMO0002 Reserved for concepts with insufficient information to code with codable children: Secondary | ICD-10-CM | POA: Diagnosis not present

## 2011-11-01 DIAGNOSIS — E7800 Pure hypercholesterolemia, unspecified: Secondary | ICD-10-CM | POA: Diagnosis not present

## 2011-11-01 DIAGNOSIS — I10 Essential (primary) hypertension: Secondary | ICD-10-CM | POA: Diagnosis not present

## 2011-11-01 DIAGNOSIS — E1065 Type 1 diabetes mellitus with hyperglycemia: Secondary | ICD-10-CM | POA: Diagnosis not present

## 2011-12-25 DIAGNOSIS — H18839 Recurrent erosion of cornea, unspecified eye: Secondary | ICD-10-CM | POA: Diagnosis not present

## 2011-12-25 DIAGNOSIS — E11311 Type 2 diabetes mellitus with unspecified diabetic retinopathy with macular edema: Secondary | ICD-10-CM | POA: Diagnosis not present

## 2011-12-25 DIAGNOSIS — E1139 Type 2 diabetes mellitus with other diabetic ophthalmic complication: Secondary | ICD-10-CM | POA: Diagnosis not present

## 2011-12-25 DIAGNOSIS — E11359 Type 2 diabetes mellitus with proliferative diabetic retinopathy without macular edema: Secondary | ICD-10-CM | POA: Diagnosis not present

## 2012-01-02 DIAGNOSIS — E1065 Type 1 diabetes mellitus with hyperglycemia: Secondary | ICD-10-CM | POA: Diagnosis not present

## 2012-01-02 DIAGNOSIS — IMO0002 Reserved for concepts with insufficient information to code with codable children: Secondary | ICD-10-CM | POA: Diagnosis not present

## 2012-01-04 DIAGNOSIS — E1065 Type 1 diabetes mellitus with hyperglycemia: Secondary | ICD-10-CM | POA: Diagnosis not present

## 2012-01-04 DIAGNOSIS — IMO0002 Reserved for concepts with insufficient information to code with codable children: Secondary | ICD-10-CM | POA: Diagnosis not present

## 2012-01-04 DIAGNOSIS — I10 Essential (primary) hypertension: Secondary | ICD-10-CM | POA: Diagnosis not present

## 2012-01-04 DIAGNOSIS — R7989 Other specified abnormal findings of blood chemistry: Secondary | ICD-10-CM | POA: Diagnosis not present

## 2012-01-04 DIAGNOSIS — E7800 Pure hypercholesterolemia, unspecified: Secondary | ICD-10-CM | POA: Diagnosis not present

## 2012-01-08 DIAGNOSIS — E11359 Type 2 diabetes mellitus with proliferative diabetic retinopathy without macular edema: Secondary | ICD-10-CM | POA: Diagnosis not present

## 2012-01-08 DIAGNOSIS — E11311 Type 2 diabetes mellitus with unspecified diabetic retinopathy with macular edema: Secondary | ICD-10-CM | POA: Diagnosis not present

## 2012-01-21 DIAGNOSIS — R7989 Other specified abnormal findings of blood chemistry: Secondary | ICD-10-CM | POA: Diagnosis not present

## 2012-01-21 DIAGNOSIS — G35 Multiple sclerosis: Secondary | ICD-10-CM | POA: Diagnosis not present

## 2012-01-23 DIAGNOSIS — E11359 Type 2 diabetes mellitus with proliferative diabetic retinopathy without macular edema: Secondary | ICD-10-CM | POA: Diagnosis not present

## 2012-01-23 DIAGNOSIS — H35359 Cystoid macular degeneration, unspecified eye: Secondary | ICD-10-CM | POA: Diagnosis not present

## 2012-01-23 DIAGNOSIS — E1139 Type 2 diabetes mellitus with other diabetic ophthalmic complication: Secondary | ICD-10-CM | POA: Diagnosis not present

## 2012-03-12 DIAGNOSIS — IMO0002 Reserved for concepts with insufficient information to code with codable children: Secondary | ICD-10-CM | POA: Diagnosis not present

## 2012-03-12 DIAGNOSIS — E039 Hypothyroidism, unspecified: Secondary | ICD-10-CM | POA: Diagnosis not present

## 2012-03-12 DIAGNOSIS — E7800 Pure hypercholesterolemia, unspecified: Secondary | ICD-10-CM | POA: Diagnosis not present

## 2012-03-12 DIAGNOSIS — E1065 Type 1 diabetes mellitus with hyperglycemia: Secondary | ICD-10-CM | POA: Diagnosis not present

## 2012-03-14 DIAGNOSIS — E039 Hypothyroidism, unspecified: Secondary | ICD-10-CM | POA: Diagnosis not present

## 2012-03-14 DIAGNOSIS — E7800 Pure hypercholesterolemia, unspecified: Secondary | ICD-10-CM | POA: Diagnosis not present

## 2012-03-14 DIAGNOSIS — I10 Essential (primary) hypertension: Secondary | ICD-10-CM | POA: Diagnosis not present

## 2012-03-14 DIAGNOSIS — IMO0002 Reserved for concepts with insufficient information to code with codable children: Secondary | ICD-10-CM | POA: Diagnosis not present

## 2012-03-14 DIAGNOSIS — Z23 Encounter for immunization: Secondary | ICD-10-CM | POA: Diagnosis not present

## 2012-03-14 DIAGNOSIS — E1065 Type 1 diabetes mellitus with hyperglycemia: Secondary | ICD-10-CM | POA: Diagnosis not present

## 2012-05-12 DIAGNOSIS — IMO0002 Reserved for concepts with insufficient information to code with codable children: Secondary | ICD-10-CM | POA: Diagnosis not present

## 2012-05-12 DIAGNOSIS — E1065 Type 1 diabetes mellitus with hyperglycemia: Secondary | ICD-10-CM | POA: Diagnosis not present

## 2012-05-13 DIAGNOSIS — IMO0002 Reserved for concepts with insufficient information to code with codable children: Secondary | ICD-10-CM | POA: Diagnosis not present

## 2012-05-13 DIAGNOSIS — E039 Hypothyroidism, unspecified: Secondary | ICD-10-CM | POA: Diagnosis not present

## 2012-05-13 DIAGNOSIS — E1065 Type 1 diabetes mellitus with hyperglycemia: Secondary | ICD-10-CM | POA: Diagnosis not present

## 2012-05-13 DIAGNOSIS — E7800 Pure hypercholesterolemia, unspecified: Secondary | ICD-10-CM | POA: Diagnosis not present

## 2012-05-29 DIAGNOSIS — E11359 Type 2 diabetes mellitus with proliferative diabetic retinopathy without macular edema: Secondary | ICD-10-CM | POA: Diagnosis not present

## 2012-05-29 DIAGNOSIS — H35359 Cystoid macular degeneration, unspecified eye: Secondary | ICD-10-CM | POA: Diagnosis not present

## 2012-05-29 DIAGNOSIS — E1139 Type 2 diabetes mellitus with other diabetic ophthalmic complication: Secondary | ICD-10-CM | POA: Diagnosis not present

## 2012-06-10 DIAGNOSIS — IMO0002 Reserved for concepts with insufficient information to code with codable children: Secondary | ICD-10-CM | POA: Diagnosis not present

## 2012-06-10 DIAGNOSIS — E1065 Type 1 diabetes mellitus with hyperglycemia: Secondary | ICD-10-CM | POA: Diagnosis not present

## 2012-06-11 ENCOUNTER — Encounter (INDEPENDENT_AMBULATORY_CARE_PROVIDER_SITE_OTHER): Payer: Medicare Other | Admitting: Ophthalmology

## 2012-06-11 DIAGNOSIS — H35039 Hypertensive retinopathy, unspecified eye: Secondary | ICD-10-CM

## 2012-06-11 DIAGNOSIS — E11311 Type 2 diabetes mellitus with unspecified diabetic retinopathy with macular edema: Secondary | ICD-10-CM | POA: Diagnosis not present

## 2012-06-11 DIAGNOSIS — I10 Essential (primary) hypertension: Secondary | ICD-10-CM

## 2012-06-11 DIAGNOSIS — E1165 Type 2 diabetes mellitus with hyperglycemia: Secondary | ICD-10-CM

## 2012-06-11 DIAGNOSIS — H33019 Retinal detachment with single break, unspecified eye: Secondary | ICD-10-CM | POA: Diagnosis not present

## 2012-06-11 DIAGNOSIS — E11359 Type 2 diabetes mellitus with proliferative diabetic retinopathy without macular edema: Secondary | ICD-10-CM

## 2012-06-11 DIAGNOSIS — H43819 Vitreous degeneration, unspecified eye: Secondary | ICD-10-CM

## 2012-06-11 DIAGNOSIS — E1139 Type 2 diabetes mellitus with other diabetic ophthalmic complication: Secondary | ICD-10-CM

## 2012-06-13 DIAGNOSIS — E1039 Type 1 diabetes mellitus with other diabetic ophthalmic complication: Secondary | ICD-10-CM | POA: Diagnosis not present

## 2012-06-13 DIAGNOSIS — E11359 Type 2 diabetes mellitus with proliferative diabetic retinopathy without macular edema: Secondary | ICD-10-CM | POA: Diagnosis not present

## 2012-06-13 DIAGNOSIS — H35359 Cystoid macular degeneration, unspecified eye: Secondary | ICD-10-CM | POA: Diagnosis not present

## 2012-07-07 DIAGNOSIS — R05 Cough: Secondary | ICD-10-CM | POA: Diagnosis not present

## 2012-07-07 DIAGNOSIS — E7800 Pure hypercholesterolemia, unspecified: Secondary | ICD-10-CM | POA: Diagnosis not present

## 2012-07-07 DIAGNOSIS — I10 Essential (primary) hypertension: Secondary | ICD-10-CM | POA: Diagnosis not present

## 2012-07-07 DIAGNOSIS — R059 Cough, unspecified: Secondary | ICD-10-CM | POA: Diagnosis not present

## 2012-07-07 DIAGNOSIS — G35 Multiple sclerosis: Secondary | ICD-10-CM | POA: Diagnosis not present

## 2012-07-17 DIAGNOSIS — L280 Lichen simplex chronicus: Secondary | ICD-10-CM | POA: Diagnosis not present

## 2012-07-17 DIAGNOSIS — D485 Neoplasm of uncertain behavior of skin: Secondary | ICD-10-CM | POA: Diagnosis not present

## 2012-07-17 DIAGNOSIS — L259 Unspecified contact dermatitis, unspecified cause: Secondary | ICD-10-CM | POA: Diagnosis not present

## 2012-07-17 DIAGNOSIS — L988 Other specified disorders of the skin and subcutaneous tissue: Secondary | ICD-10-CM | POA: Diagnosis not present

## 2012-07-25 DIAGNOSIS — G35 Multiple sclerosis: Secondary | ICD-10-CM | POA: Diagnosis not present

## 2012-07-25 DIAGNOSIS — R5381 Other malaise: Secondary | ICD-10-CM | POA: Diagnosis not present

## 2012-07-28 DIAGNOSIS — R059 Cough, unspecified: Secondary | ICD-10-CM | POA: Diagnosis not present

## 2012-07-28 DIAGNOSIS — R05 Cough: Secondary | ICD-10-CM | POA: Diagnosis not present

## 2012-07-29 DIAGNOSIS — D2360 Other benign neoplasm of skin of unspecified upper limb, including shoulder: Secondary | ICD-10-CM | POA: Diagnosis not present

## 2012-07-29 DIAGNOSIS — L821 Other seborrheic keratosis: Secondary | ICD-10-CM | POA: Diagnosis not present

## 2012-07-29 DIAGNOSIS — D485 Neoplasm of uncertain behavior of skin: Secondary | ICD-10-CM | POA: Diagnosis not present

## 2012-08-18 DIAGNOSIS — E7800 Pure hypercholesterolemia, unspecified: Secondary | ICD-10-CM | POA: Diagnosis not present

## 2012-08-18 DIAGNOSIS — IMO0002 Reserved for concepts with insufficient information to code with codable children: Secondary | ICD-10-CM | POA: Diagnosis not present

## 2012-08-18 DIAGNOSIS — E039 Hypothyroidism, unspecified: Secondary | ICD-10-CM | POA: Diagnosis not present

## 2012-08-18 DIAGNOSIS — E1065 Type 1 diabetes mellitus with hyperglycemia: Secondary | ICD-10-CM | POA: Diagnosis not present

## 2012-08-20 DIAGNOSIS — IMO0002 Reserved for concepts with insufficient information to code with codable children: Secondary | ICD-10-CM | POA: Diagnosis not present

## 2012-08-20 DIAGNOSIS — E039 Hypothyroidism, unspecified: Secondary | ICD-10-CM | POA: Diagnosis not present

## 2012-08-20 DIAGNOSIS — E1065 Type 1 diabetes mellitus with hyperglycemia: Secondary | ICD-10-CM | POA: Diagnosis not present

## 2012-08-20 DIAGNOSIS — E7800 Pure hypercholesterolemia, unspecified: Secondary | ICD-10-CM | POA: Diagnosis not present

## 2012-09-25 DIAGNOSIS — E11359 Type 2 diabetes mellitus with proliferative diabetic retinopathy without macular edema: Secondary | ICD-10-CM | POA: Diagnosis not present

## 2012-09-25 DIAGNOSIS — H2510 Age-related nuclear cataract, unspecified eye: Secondary | ICD-10-CM | POA: Diagnosis not present

## 2012-09-25 DIAGNOSIS — H35359 Cystoid macular degeneration, unspecified eye: Secondary | ICD-10-CM | POA: Diagnosis not present

## 2012-09-25 DIAGNOSIS — E1039 Type 1 diabetes mellitus with other diabetic ophthalmic complication: Secondary | ICD-10-CM | POA: Diagnosis not present

## 2012-11-20 DIAGNOSIS — E7800 Pure hypercholesterolemia, unspecified: Secondary | ICD-10-CM | POA: Diagnosis not present

## 2012-11-20 DIAGNOSIS — IMO0002 Reserved for concepts with insufficient information to code with codable children: Secondary | ICD-10-CM | POA: Diagnosis not present

## 2012-11-20 DIAGNOSIS — E1065 Type 1 diabetes mellitus with hyperglycemia: Secondary | ICD-10-CM | POA: Diagnosis not present

## 2012-11-27 ENCOUNTER — Ambulatory Visit: Payer: Medicare Other | Admitting: Endocrinology

## 2012-12-02 ENCOUNTER — Ambulatory Visit (INDEPENDENT_AMBULATORY_CARE_PROVIDER_SITE_OTHER): Payer: Medicare Other | Admitting: Endocrinology

## 2012-12-02 ENCOUNTER — Encounter: Payer: Self-pay | Admitting: Endocrinology

## 2012-12-02 VITALS — BP 124/80 | HR 84 | Temp 98.3°F | Ht 71.0 in | Wt 210.0 lb

## 2012-12-02 DIAGNOSIS — E119 Type 2 diabetes mellitus without complications: Secondary | ICD-10-CM

## 2012-12-02 DIAGNOSIS — E7800 Pure hypercholesterolemia, unspecified: Secondary | ICD-10-CM

## 2012-12-02 DIAGNOSIS — E039 Hypothyroidism, unspecified: Secondary | ICD-10-CM

## 2012-12-02 MED ORDER — PRAVASTATIN SODIUM 20 MG PO TABS
20.0000 mg | ORAL_TABLET | Freq: Every day | ORAL | Status: DC
Start: 2012-12-02 — End: 2013-04-06

## 2012-12-02 MED ORDER — PRAVASTATIN SODIUM 20 MG PO TABS: 20.0000 mg | ORAL_TABLET | Freq: Every day | ORAL | Status: DC

## 2012-12-02 NOTE — Patient Instructions (Signed)
Call if am sugar stays >150, also if after lunch sugar goes up > 50 mg  Start Pravastatin 20mg  daily

## 2012-12-02 NOTE — Progress Notes (Signed)
Subjective:     Patient ID: Johnathan Ross, male   DOB: 1971-04-30, 42 y.o.   MRN: [id number]  HPI  Diagnosis: Type 1 diabetes mellitus, date of diagnosis: 1995.   Insulin Pump followup:   CURRENT brand:  Medtronic  HISTORY:   An insulin pump has been in use Since 03/13/11.   The pump settings are basal rate: 1.4 until 4 a.m; 4 AM = 1.3. 6 a.m.= 1.45. 11 a.m. = 0.9. 2 p.m. = 1.2. 4 p.m. = 1.9 and 6 p.m. = 1.6; carbohydrate to insulin ratio: 1:12, at 11 a.m. = 1: 9, after 6 p.m. = 1:7, hyperglycemia correction factor: 1:18,and after 10 p.m. 1:30, target 120, active insulin time 4 hours.    His insulin pump basal rates were changed on his last visit in 3/14  The blood sugar control with the pump by records reviewed show that his blood sugars are still  variable overnight and during the day but better in the evenings now.  HYPERGLYCEMIA: This appears to be more consistent in the afternoons and occasionally in the mornings. However he has not checked readings at lunchtime usually since he is not usually at home. Has less hyperglycemia overnight compared to the last time with increasing the midnight basal rate. Highest blood sugar at home is 341 at about 6 PM  His HYPOGLYCEMIA appears to be  Minimal recently but cranial readings in the 70s and once more 60 at about 11 AM  after correcting a high sugar  Most recent hemoglobin A1c is 7.6 in 6/14. Glucose on the lab because 218  Compared to the last visit the diabetes is  a little worse when A1c was 7.2%  EXERCISE: No program activity, he is generally moving around during the day   MICROALBUMIN has been tested, and the result is normal .     Medication List       This list is accurate as of: 12/02/12  8:22 AM.  Always use your most recent med list.               GILENYA 0.5 MG Caps  Generic drug:  Fingolimod HCl  Take 0.5 mg by mouth daily.     HUMALOG 100 UNIT/ML injection  Generic drug:  insulin lispro     pravastatin 40 MG  tablet  Commonly known as:  PRAVACHOL  TAKE TWO TABLETS BY MOUTH EVERY DAY     SYNTHROID 75 MCG tablet  Generic drug:  levothyroxine        Allergies: Allergies not on file  No past medical history on file.  No past surgical history on file.  No family history on file.  Social History:  reports that he has never smoked. He has never used smokeless tobacco. His alcohol and drug histories are not on file.    Review of Systems    HYPERCHOLESTEROLEMIA: He has been on and off pravastatin and not clear if this has been causing any abnormalities of his liver functions. Currently his LDL is 125 in 6/14 with stopping his pravastatin on the last visit. However his ALT is still slightly high at 60  HYPERTENSION: Not present, has not been on any treatment  NEUROPATHY: Present and had no recent symptoms  He has MULTIPLE sclerosis on treatment   Objective:   Physical Exam  BP 124/80  Pulse 84  Temp(Src) 98.3 F (36.8 C)  Ht 5\' 11"  (1.803 m)  Wt 210 lb (95.255 kg)  BMI 29.3 kg/m2  SpO2  99%     Assessment:      DIABETES control: Sugars are persistently high in the afternoon and this may be partly from not checking his blood sugar before bolusing for lunch. Also morning sugars are rather variable although recently better. Overall his control is reasonably good with A1c 7.6 but control is limited by excessive fluctuation of blood sugars overnight and in the mornings. He thinks that some of the higher readings are related to not inserting his infusion set correctly;  has been doing this properly in the last week . and he thinks his blood sugars might be improving. Counseling today on glucose monitoring and sugar targets, importance of checking pre-meal blood sugars especially at lunch, how to adjust basal rates, blood sugar targets after meals and need for adjusting carbohydrate ratio if blood sugars are higher especially after lunch. He is still are comfortable changing his settings on  his own and this was done for him today   HYPERCHOLESTEROLEMIA: Will start him on 20 mg Pravachol as his liver function abnormality does not appear to be related to taking this in the past  Counseling time over 50% of today's 25 minute visit    HYPOTHYROIDISM: To have TSH checked on the next visit  Plan:     Basal rate at 12 noon will be 1.1 and from 2 PM-4 PM will be 1.3     Consider changing carbohydrate ratio at lunch to 1:8 if blood sugars are high after lunch consistently

## 2012-12-04 ENCOUNTER — Telehealth: Payer: Self-pay | Admitting: Endocrinology

## 2012-12-04 NOTE — Telephone Encounter (Signed)
He was on lisinopril HCTZ 20/12.5 mg and can refill this. Also can increase the daily insulin to 35 units which will give him 2 vials

## 2012-12-04 NOTE — Telephone Encounter (Signed)
Johnathan Ross called about his bp meds, he said he's been out of the lisinopril and yesterday his bp was 148/93, and 133/89, he also says he's running out of insulin before it's time to refill, and needs it increased to 30 units with pump, please advise.

## 2012-12-05 ENCOUNTER — Other Ambulatory Visit: Payer: Self-pay | Admitting: *Deleted

## 2012-12-05 MED ORDER — INSULIN LISPRO 100 UNIT/ML ~~LOC~~ SOLN: 50.0000 [IU] | Freq: Every day | SUBCUTANEOUS | Status: DC

## 2012-12-05 MED ORDER — INSULIN LISPRO 100 UNIT/ML ~~LOC~~ SOLN: 35.0000 [IU] | Freq: Every day | SUBCUTANEOUS | Status: DC

## 2012-12-05 MED ORDER — LISINOPRIL-HYDROCHLOROTHIAZIDE 20-12.5 MG PO TABS: 1.0000 | ORAL_TABLET | Freq: Every day | ORAL | Status: DC

## 2013-01-09 ENCOUNTER — Other Ambulatory Visit: Payer: Self-pay | Admitting: *Deleted

## 2013-01-09 MED ORDER — INSULIN LISPRO 100 UNIT/ML ~~LOC~~ SOLN: 50.0000 [IU] | Freq: Every day | SUBCUTANEOUS | Status: DC

## 2013-01-19 ENCOUNTER — Encounter: Payer: Self-pay | Admitting: Endocrinology

## 2013-01-27 DIAGNOSIS — Z5181 Encounter for therapeutic drug level monitoring: Secondary | ICD-10-CM | POA: Diagnosis not present

## 2013-01-27 DIAGNOSIS — Z87898 Personal history of other specified conditions: Secondary | ICD-10-CM | POA: Diagnosis not present

## 2013-01-27 DIAGNOSIS — G35 Multiple sclerosis: Secondary | ICD-10-CM | POA: Diagnosis not present

## 2013-01-27 DIAGNOSIS — R5381 Other malaise: Secondary | ICD-10-CM | POA: Diagnosis not present

## 2013-01-27 DIAGNOSIS — Z79899 Other long term (current) drug therapy: Secondary | ICD-10-CM | POA: Diagnosis not present

## 2013-01-29 ENCOUNTER — Telehealth: Payer: Self-pay | Admitting: Endocrinology

## 2013-01-29 MED ORDER — GLUCOSE BLOOD VI STRP: ORAL_STRIP | Status: DC

## 2013-01-29 NOTE — Telephone Encounter (Signed)
rx sent

## 2013-01-30 ENCOUNTER — Telehealth: Payer: Self-pay | Admitting: Endocrinology

## 2013-01-30 NOTE — Telephone Encounter (Signed)
rx re-faxed with dx code 

## 2013-01-30 NOTE — Telephone Encounter (Signed)
Pt has called multiple times, says his RX is still not correct. It is still missing DX code. Please call patient! / Roanna Raider

## 2013-03-03 ENCOUNTER — Other Ambulatory Visit (INDEPENDENT_AMBULATORY_CARE_PROVIDER_SITE_OTHER): Payer: Medicare Other

## 2013-03-03 DIAGNOSIS — E039 Hypothyroidism, unspecified: Secondary | ICD-10-CM | POA: Diagnosis not present

## 2013-03-03 DIAGNOSIS — E119 Type 2 diabetes mellitus without complications: Secondary | ICD-10-CM

## 2013-03-03 DIAGNOSIS — E7800 Pure hypercholesterolemia, unspecified: Secondary | ICD-10-CM | POA: Diagnosis not present

## 2013-03-03 LAB — COMPREHENSIVE METABOLIC PANEL
ALT: 54 U/L — ABNORMAL HIGH (ref 0–53)
AST: 38 U/L — ABNORMAL HIGH (ref 0–37)
Albumin: 4 g/dL (ref 3.5–5.2)
Alkaline Phosphatase: 120 U/L — ABNORMAL HIGH (ref 39–117)
BUN: 16 mg/dL (ref 6–23)
CO2: 30 mEq/L (ref 19–32)
Calcium: 9.3 mg/dL (ref 8.4–10.5)
Chloride: 104 mEq/L (ref 96–112)
Creatinine, Ser: 1.1 mg/dL (ref 0.4–1.5)
GFR: 91.23 mL/min (ref >60.00)
Glucose, Bld: 175 mg/dL — ABNORMAL HIGH (ref 70–99)
Potassium: 4.7 mEq/L (ref 3.5–5.1)
Sodium: 138 mEq/L (ref 135–145)
Total Bilirubin: 0.6 mg/dL (ref 0.3–1.2)
Total Protein: 6.8 g/dL (ref 6.0–8.3)

## 2013-03-03 LAB — LIPID PANEL
Cholesterol: 145 mg/dL (ref 0–200)
HDL: 51.9 mg/dL (ref >39.00)
LDL Cholesterol: 81 mg/dL (ref 0–99)
Total CHOL/HDL Ratio: 3
Triglycerides: 59 mg/dL (ref 0.0–149.0)
VLDL: 11.8 mg/dL (ref 0.0–40.0)

## 2013-03-03 LAB — MICROALBUMIN / CREATININE URINE RATIO
Creatinine,U: 96.4 mg/dL
Microalb Creat Ratio: 2.2 mg/g (ref 0.0–30.0)
Microalb, Ur: 2.1 mg/dL — ABNORMAL HIGH (ref 0.0–1.9)

## 2013-03-03 LAB — TSH: TSH: 1.28 u[IU]/mL (ref 0.35–5.50)

## 2013-03-03 LAB — HEMOGLOBIN A1C: Hgb A1c MFr Bld: 7.8 % — ABNORMAL HIGH (ref 4.6–6.5)

## 2013-03-04 ENCOUNTER — Other Ambulatory Visit: Payer: Medicare Other

## 2013-03-06 ENCOUNTER — Ambulatory Visit (INDEPENDENT_AMBULATORY_CARE_PROVIDER_SITE_OTHER): Payer: Medicare Other | Admitting: Endocrinology

## 2013-03-06 ENCOUNTER — Encounter: Payer: Self-pay | Admitting: Endocrinology

## 2013-03-06 VITALS — BP 112/64 | HR 77 | Temp 98.5°F | Resp 12 | Ht 73.0 in | Wt 212.4 lb

## 2013-03-06 DIAGNOSIS — E7800 Pure hypercholesterolemia, unspecified: Secondary | ICD-10-CM | POA: Diagnosis not present

## 2013-03-06 DIAGNOSIS — R7989 Other specified abnormal findings of blood chemistry: Secondary | ICD-10-CM | POA: Insufficient documentation

## 2013-03-06 DIAGNOSIS — R945 Abnormal results of liver function studies: Secondary | ICD-10-CM | POA: Insufficient documentation

## 2013-03-06 DIAGNOSIS — IMO0002 Reserved for concepts with insufficient information to code with codable children: Secondary | ICD-10-CM

## 2013-03-06 DIAGNOSIS — E1065 Type 1 diabetes mellitus with hyperglycemia: Secondary | ICD-10-CM | POA: Diagnosis not present

## 2013-03-06 DIAGNOSIS — E039 Hypothyroidism, unspecified: Secondary | ICD-10-CM | POA: Diagnosis not present

## 2013-03-06 NOTE — Patient Instructions (Addendum)
Do not restart Pravastatin or Lisinopril HCT  Change basal rates  at midnight = 1.5, 6 AM = 1.55 and 6 PM = 1.5  Extra bolus of 2-6 units for hi fat meals

## 2013-03-06 NOTE — Progress Notes (Signed)
Subjective:     Patient ID: Johnathan Ross, male   DOB: Sep 17, 1970, 42 y.o.   MRN: [id number]  HPI  Diagnosis: Type 1 diabetes mellitus, date of diagnosis: 1995.   Insulin Pump followup:   CURRENT brand:  Medtronic  HISTORY: An insulin pump has been in use Since 03/13/11.   PUMP settings are basal rate: 1.4 until 4 a.m; 4 AM = 1.3. 6 a.m.= 1.45. 11 a.m. = 0.9. 2 p.m. = 1.3. 4 p.m. = 1.9 and 6 p.m. = 1.6; carbohydrate to insulin ratio: 1:12, at 11 a.m. = 1: 9, after 6 p.m. = 1:7, hyperglycemia correction factor: 1:18,and after 10 p.m. 1:30, target 120, active insulin time 4 hours.  Bolus data: Has 6.3 boluses per day, 70% with correction and no overriding Changing infusion set every 3-4 days GLUCOSE data: Average 168 with standard deviation 72 for the last 2 weeks Fasting glucose average 177 with range 98-228. Lunchtime average 142 with recent range 140-194 and suppertime average 167, recent range 51-280. Around midnight-1 AM 154-302   HYPERGLYCEMIA:  This appears to be more consistent  in the mornings. Also may have relative high readings around 4-5 PM  Blood sugar patterns are inconsistent the rest of the time but usually has high readings right after midnight   His HYPOGLYCEMIA appears to be  occurring between 6-8 PM, occasionally after a bolus  Most recent hemoglobin A1c is 7.6 in 6/14. Glucose on the lab 218  Lab Results  Component Value Date   HGBA1C 7.8* 03/03/2013   HGBA1C 11.3* 08/22/2009   HGBA1C 10.3* 12/27/2008   Lab Results  Component Value Date   MICROALBUR 2.1* 03/03/2013   LDLCALC 81 03/03/2013   CREATININE 1.1 03/03/2013    EXERCISE: No program activity, he is generally moving around during the day   MICROALBUMIN has been tested, and the result is normal .     Medication List       This list is accurate as of: 03/06/13  8:33 AM.  Always use your most recent med list.               AMPYRA 10 MG Tb12  Generic drug:  dalfampridine  10 mg.     GILENYA  0.5 MG Caps  Generic drug:  Fingolimod HCl  Take 0.5 mg by mouth daily.     glucose blood test strip  Commonly known as:  BAYER CONTOUR NEXT TEST  Use as instructed to check blood sugars 8 times per day     insulin lispro 100 UNIT/ML injection  Commonly known as:  HUMALOG  Inject 50 Units into the skin daily.     lisinopril-hydrochlorothiazide 20-12.5 MG per tablet  Commonly known as:  ZESTORETIC  Take 1 tablet by mouth daily.     pravastatin 20 MG tablet  Commonly known as:  PRAVACHOL  Take 1 tablet (20 mg total) by mouth daily.     SYNTHROID 75 MCG tablet  Generic drug:  levothyroxine     triamcinolone cream 0.1 %  Commonly known as:  KENALOG        Allergies: No Known Allergies  No past medical history on file.  No past surgical history on file.  No family history on file.  Social History:  reports that he has never smoked. He has never used smokeless tobacco. His alcohol and drug histories are not on file.    Review of Systems    HYPERCHOLESTEROLEMIA: He has been on and off pravastatin and  not clear if this has been causing any abnormalities of his liver functions. Currently his LDL is 125 in 6/14 with stopping his pravastatin on the last visit. However his ALT is still slightly high at 60  HYPERTENSION: Not present, has not been on any regular treatment, recent home reading 124/80, has not taken any Zestoretic for about a week now  NEUROPATHY: Present and had no recent symptoms  He has MULTIPLE sclerosis on treatment   Objective:   Physical Exam  BP 112/64  Pulse 77  Temp(Src) 98.5 F (36.9 C)  Resp 12  Ht 6\' 1"  (1.854 m)  Wt 212 lb 6.4 oz (96.344 kg)  BMI 28.03 kg/m2  SpO2 98% Repeat BP 130/72    Assessment:      DIABETES control: His blood sugar control is still quite inconsistent and overall high. Recently has tendency to high readings on waking up with fasting readings averaging about 177 although has had one episode of low sugar about 2  weeks ago at 4 AM Other patterns: Periodically high readings after lunch including a reading of 381 with eating fast food, this is from not extra bolus for higher fat meals and also under bolusing at lunchtime for fear of afternoon hypoglycemia Tendency to low blood sugars around 6-7 PM although occasionally these may occur after a bolus; this may be partly related to be more active Variable readings after supper, may be related to not estimating carbohydrates and fats accurately at supper Relatively lower readings at bedtime but high readings after midnight; some of this may be related to a nighttime snack for fear of hypoglycemia  He also appears to be eating very frequently on some days with daily carbohydrate intake averaging 280 g   HYPERCHOLESTEROLEMIA: Will hold off on his pravastatin treatment while getting GI evaluation for persistently abnormal liver functions without any medication use Counseling time over 50% of today's 25 minute visit    HYPOTHYROIDISM: TSH normal, will continue same dose  Abnormal liver function tests: These are persistent. Etiology of this is unclear as he is not on any specific medications and would recommend GI consultation  ? Hypertension: He is not taking his antihypertensive medication and blood pressure appears fairly good. Will continue to watch without medications  Plan:     Basal rate at 12 midnight will be 1.5, increased and from 6 PM-midnight will be 1.5, reduced and increased at 6 AM to 1.55   better management of mealtime coverage with extra insulin for higher fat meals and appropriate bolusing when not very active. Avoiding excessive snacks

## 2013-03-11 ENCOUNTER — Other Ambulatory Visit: Payer: Self-pay | Admitting: *Deleted

## 2013-03-11 ENCOUNTER — Telehealth: Payer: Self-pay | Admitting: Endocrinology

## 2013-03-11 ENCOUNTER — Encounter: Payer: Self-pay | Admitting: Gastroenterology

## 2013-03-11 MED ORDER — GLUCOSE BLOOD VI STRP: ORAL_STRIP | Status: DC

## 2013-03-12 ENCOUNTER — Other Ambulatory Visit: Payer: Self-pay | Admitting: *Deleted

## 2013-03-12 MED ORDER — INSULIN LISPRO 100 UNIT/ML ~~LOC~~ SOLN: SUBCUTANEOUS | Status: DC

## 2013-03-13 ENCOUNTER — Telehealth: Payer: Self-pay | Admitting: *Deleted

## 2013-03-13 NOTE — Telephone Encounter (Signed)
Noted, pt is aware 

## 2013-03-13 NOTE — Telephone Encounter (Signed)
Liver enzyme tests are high, could be from several causes

## 2013-03-13 NOTE — Telephone Encounter (Signed)
Pt says you are referring him to a specialist about his liver because the test came back abnormal, he wants to know what about the test is abnormal?

## 2013-03-19 ENCOUNTER — Other Ambulatory Visit: Payer: Self-pay | Admitting: *Deleted

## 2013-03-19 ENCOUNTER — Telehealth: Payer: Self-pay | Admitting: Endocrinology

## 2013-03-19 MED ORDER — GLUCOSE BLOOD VI STRP: ORAL_STRIP | Status: DC

## 2013-03-19 MED ORDER — INSULIN LISPRO 100 UNIT/ML ~~LOC~~ SOLN: SUBCUTANEOUS | Status: DC

## 2013-03-19 NOTE — Telephone Encounter (Signed)
rx sent

## 2013-04-02 DIAGNOSIS — H35359 Cystoid macular degeneration, unspecified eye: Secondary | ICD-10-CM | POA: Diagnosis not present

## 2013-04-02 DIAGNOSIS — E1039 Type 1 diabetes mellitus with other diabetic ophthalmic complication: Secondary | ICD-10-CM | POA: Diagnosis not present

## 2013-04-02 DIAGNOSIS — E11359 Type 2 diabetes mellitus with proliferative diabetic retinopathy without macular edema: Secondary | ICD-10-CM | POA: Diagnosis not present

## 2013-04-02 DIAGNOSIS — H35379 Puckering of macula, unspecified eye: Secondary | ICD-10-CM | POA: Diagnosis not present

## 2013-04-06 ENCOUNTER — Other Ambulatory Visit (INDEPENDENT_AMBULATORY_CARE_PROVIDER_SITE_OTHER): Payer: Medicare Other

## 2013-04-06 ENCOUNTER — Ambulatory Visit (INDEPENDENT_AMBULATORY_CARE_PROVIDER_SITE_OTHER): Payer: Medicare Other | Admitting: Gastroenterology

## 2013-04-06 ENCOUNTER — Encounter: Payer: Self-pay | Admitting: Gastroenterology

## 2013-04-06 VITALS — BP 120/62 | HR 74 | Ht 73.0 in | Wt 215.0 lb

## 2013-04-06 DIAGNOSIS — R7989 Other specified abnormal findings of blood chemistry: Secondary | ICD-10-CM | POA: Diagnosis not present

## 2013-04-06 DIAGNOSIS — R7401 Elevation of levels of liver transaminase levels: Secondary | ICD-10-CM

## 2013-04-06 DIAGNOSIS — R7402 Elevation of levels of lactic acid dehydrogenase (LDH): Secondary | ICD-10-CM

## 2013-04-06 DIAGNOSIS — R945 Abnormal results of liver function studies: Secondary | ICD-10-CM

## 2013-04-06 LAB — IBC PANEL
Iron: 119 ug/dL (ref 42–165)
Saturation Ratios: 34.5 % (ref 20.0–50.0)
Transferrin: 246.2 mg/dL (ref 212.0–360.0)

## 2013-04-06 LAB — FERRITIN: Ferritin: 84.7 ng/mL (ref 22.0–322.0)

## 2013-04-06 NOTE — Patient Instructions (Signed)
Your physician has requested that you go to the basement for lab work before leaving today.  You have been scheduled for an abdominal ultrasound at [facility] (1st floor of hospital) on 04/08/13 at 8:00am. Please arrive 15 minutes prior to your appointment for registration. Make certain not to have anything to eat or drink 6 hours prior to your appointment. Should you need to reschedule your appointment, please contact radiology at [phone]. This test typically takes about 30 minutes to perform.  Thank you for choosing me and  Gastroenterology.  Venita Lick. Pleas Koch., MD., Clementeen Graham

## 2013-04-06 NOTE — Progress Notes (Signed)
    History of Present Illness: This is a 42 year old male accompanied by his wife. He was recently found to have mildly elevated transaminases, AST=38 and ALT=54. Several previous LFTs were normal. In reviewing his chart he has mildly elevated transaminases in late 2007. Is the surface antigen and hepatitis C antibody were -2008. He has long-standing diabetes mellitus. He was previously treated with Pravachol but this was recently discontinued due to elevated transaminases. He has no history of liver disease, jaundice, hepatitis. Denies weight loss, abdominal pain, constipation, diarrhea, change in stool caliber, melena, hematochezia, nausea, vomiting, dysphagia, reflux symptoms, chest pain.  Review of Systems: Pertinent positive and negative review of systems were noted in the above HPI section. All other review of systems were otherwise negative.  Current Medications, Allergies, Past Medical History, Past Surgical History, Family History and Social History were reviewed in [employer] record.  Physical Exam: General: Well developed , well nourished, no acute distress Head: Normocephalic and atraumatic Eyes:  sclerae anicteric, EOMI Ears: Normal auditory acuity Mouth: No deformity or lesions Neck: Supple, no masses or thyromegaly Lungs: Clear throughout to auscultation Heart: Regular rate and rhythm; no murmurs, rubs or bruits Abdomen: Soft, non tender and non distended. No masses, hepatosplenomegaly or hernias noted. Normal Bowel sounds Musculoskeletal: Symmetrical with no gross deformities  Skin: No lesions on visible extremities Pulses:  Normal pulses noted Extremities: No clubbing, cyanosis, edema or deformities noted Neurological: Alert oriented x 4, grossly nonfocal Cervical Nodes:  No significant cervical adenopathy Inguinal Nodes: No significant inguinal adenopathy Psychological:  Alert and cooperative. Normal mood and affect  Assessment and  Recommendations:  1. Elevated transaminases. Rule out Pravachol side effect, rule out fatty infiltration of the liver. Obtain all standard hepatic evaluation serologies. Schedule abdominal ultrasound. Return office visit 4-6 weeks. Will recheck LFTs at that time to trend after discontinuing Pravachol.

## 2013-04-07 LAB — HEPATITIS B SURFACE ANTIGEN: Hepatitis B Surface Ag: NEGATIVE

## 2013-04-07 LAB — ANA: Anti Nuclear Antibody(ANA): NEGATIVE

## 2013-04-07 LAB — HEPATITIS B SURFACE ANTIBODY,QUALITATIVE: Hep B S Ab: NEGATIVE

## 2013-04-07 LAB — ANGIOTENSIN CONVERTING ENZYME: Angiotensin-Converting Enzyme: 35 U/L (ref 8–52)

## 2013-04-07 LAB — MITOCHONDRIAL ANTIBODIES: Mitochondrial M2 Ab, IgG: 0.44 (ref <0.91)

## 2013-04-08 ENCOUNTER — Ambulatory Visit (HOSPITAL_COMMUNITY)
Admission: RE | Admit: 2013-04-08 | Discharge: 2013-04-08 | Disposition: A | Discharge status: Home / Self Care | Payer: Medicare Other | Source: Ambulatory Visit | Attending: Gastroenterology | Admitting: Gastroenterology

## 2013-04-08 DIAGNOSIS — R748 Abnormal levels of other serum enzymes: Secondary | ICD-10-CM | POA: Diagnosis not present

## 2013-04-08 DIAGNOSIS — R7402 Elevation of levels of lactic acid dehydrogenase (LDH): Secondary | ICD-10-CM

## 2013-04-08 DIAGNOSIS — R945 Abnormal results of liver function studies: Secondary | ICD-10-CM

## 2013-04-08 DIAGNOSIS — R7989 Other specified abnormal findings of blood chemistry: Secondary | ICD-10-CM | POA: Diagnosis not present

## 2013-04-08 DIAGNOSIS — R7401 Elevation of levels of liver transaminase levels: Secondary | ICD-10-CM

## 2013-04-08 LAB — CERULOPLASMIN: Ceruloplasmin: 36 mg/dL (ref 20–60)

## 2013-04-08 LAB — ALPHA-1-ANTITRYPSIN: A-1 Antitrypsin, Ser: 150 mg/dL (ref 90–200)

## 2013-04-08 LAB — ANTI-SMOOTH MUSCLE ANTIBODY, IGG: Smooth Muscle Ab: 14 U (ref <20)

## 2013-05-28 HISTORY — PX: ORIF ANKLE FRACTURE: SUR919

## 2013-06-12 ENCOUNTER — Ambulatory Visit: Payer: Medicare Other | Admitting: Endocrinology

## 2013-06-12 ENCOUNTER — Other Ambulatory Visit (INDEPENDENT_AMBULATORY_CARE_PROVIDER_SITE_OTHER): Payer: Medicare Other

## 2013-06-12 DIAGNOSIS — E1065 Type 1 diabetes mellitus with hyperglycemia: Secondary | ICD-10-CM

## 2013-06-12 DIAGNOSIS — IMO0002 Reserved for concepts with insufficient information to code with codable children: Secondary | ICD-10-CM | POA: Diagnosis not present

## 2013-06-12 DIAGNOSIS — E039 Hypothyroidism, unspecified: Secondary | ICD-10-CM

## 2013-06-12 DIAGNOSIS — Z0289 Encounter for other administrative examinations: Secondary | ICD-10-CM

## 2013-06-12 LAB — COMPREHENSIVE METABOLIC PANEL
ALT: 50 U/L (ref 0–53)
AST: 40 U/L — ABNORMAL HIGH (ref 0–37)
Albumin: 4.1 g/dL (ref 3.5–5.2)
Alkaline Phosphatase: 117 U/L (ref 39–117)
BUN: 15 mg/dL (ref 6–23)
CO2: 26 mEq/L (ref 19–32)
Calcium: 9.3 mg/dL (ref 8.4–10.5)
Chloride: 104 mEq/L (ref 96–112)
Creatinine, Ser: 1.2 mg/dL (ref 0.4–1.5)
GFR: 88.42 mL/min (ref >60.00)
Glucose, Bld: 164 mg/dL — ABNORMAL HIGH (ref 70–99)
Potassium: 4.4 mEq/L (ref 3.5–5.1)
Sodium: 137 mEq/L (ref 135–145)
Total Bilirubin: 0.7 mg/dL (ref 0.3–1.2)
Total Protein: 7 g/dL (ref 6.0–8.3)

## 2013-06-12 LAB — LIPID PANEL
Cholesterol: 196 mg/dL (ref 0–200)
HDL: 55.5 mg/dL (ref >39.00)
LDL Cholesterol: 127 mg/dL — ABNORMAL HIGH (ref 0–99)
Total CHOL/HDL Ratio: 4
Triglycerides: 69 mg/dL (ref 0.0–149.0)
VLDL: 13.8 mg/dL (ref 0.0–40.0)

## 2013-06-12 LAB — TSH: TSH: 0.96 u[IU]/mL (ref 0.35–5.50)

## 2013-06-12 LAB — MICROALBUMIN / CREATININE URINE RATIO
Creatinine,U: 129.1 mg/dL
Microalb Creat Ratio: 2.2 mg/g (ref 0.0–30.0)
Microalb, Ur: 2.9 mg/dL — ABNORMAL HIGH (ref 0.0–1.9)

## 2013-06-12 LAB — HEMOGLOBIN A1C: Hgb A1c MFr Bld: 7.7 % — ABNORMAL HIGH (ref 4.6–6.5)

## 2013-06-22 ENCOUNTER — Ambulatory Visit (INDEPENDENT_AMBULATORY_CARE_PROVIDER_SITE_OTHER): Payer: Medicare Other | Admitting: Endocrinology

## 2013-06-22 ENCOUNTER — Encounter: Payer: Self-pay | Admitting: Endocrinology

## 2013-06-22 ENCOUNTER — Other Ambulatory Visit: Payer: Medicare Other | Admitting: *Deleted

## 2013-06-22 VITALS — BP 126/82 | HR 81 | Temp 98.3°F | Resp 14 | Ht 73.0 in | Wt 218.8 lb

## 2013-06-22 DIAGNOSIS — Z23 Encounter for immunization: Secondary | ICD-10-CM

## 2013-06-22 DIAGNOSIS — IMO0002 Reserved for concepts with insufficient information to code with codable children: Secondary | ICD-10-CM | POA: Diagnosis not present

## 2013-06-22 DIAGNOSIS — E1065 Type 1 diabetes mellitus with hyperglycemia: Secondary | ICD-10-CM

## 2013-06-22 DIAGNOSIS — I10 Essential (primary) hypertension: Secondary | ICD-10-CM

## 2013-06-22 DIAGNOSIS — E7800 Pure hypercholesterolemia, unspecified: Secondary | ICD-10-CM | POA: Diagnosis not present

## 2013-06-22 MED ORDER — PRAVASTATIN SODIUM 20 MG PO TABS: 20.0000 mg | ORAL_TABLET | Freq: Every day | ORAL | Status: DC

## 2013-06-22 NOTE — Progress Notes (Signed)
Subjective:     Patient ID: Johnathan Ross, male   DOB: [birth year], 43 y.o.   MRN: [id number]  HPI  Diagnosis: Type 1 diabetes mellitus, date of diagnosis: 1995.   Insulin Pump followup:   CURRENT brand:  Medtronic  HISTORY: An insulin pump has been in use Since 03/13/11.   PUMP settings are basal rate: 1.5 until 4 a.m; 4 AM = 1.3. 6 a.m.= 1.55. 11 a.m. = 0.9. 2 p.m. = 1.3. 4 p.m. = 1.9 and 6 p.m. = 1.5 Carbohydrate to insulin ratio: 1:12, at 11 a.m. = 1: 9, after 6 p.m. = 1:7, hyperglycemia correction factor: 1:18,and after 10 p.m. 1:30, target 120, active insulin time 4 hours.  Bolus data: Has 6.6 boluses per day, 62 % with correction and no overriding Changing infusion set every 3-4 days GLUCOSE data:   PREMEAL Breakfast Lunch Dinner Bedtime Overall  Glucose range:  89-292     111-353    Mean/median:  160   125   165    169  +/-70    POST-MEAL PC Breakfast PC Lunch PC Dinner  Glucose range:  132-174    116-297   Mean/median:   165   176    Average 168 with standard deviation 72 for the last 2 weeks Fasting glucose average 177 with range 98-228. Lunchtime average 142 with recent range 140-194 and suppertime average 167, recent range 51-280. Around midnight-1 AM 154-302   HYPERGLYCEMIA:  Does not have any inconsistent pattern of hypoglycemia although overall has high readings around 5-6 PM He is generally more active in the afternoon and may have some relatively low readings mid afternoon when he will eat a snack Also blood sugars after supper will tend  to be higher if he has high fat meals; does not appear to be using  dual wave bolus but also does not get low sugars with the regular boluses usually Fasting blood sugars are better than the last time and only occasionally high when he has a high fat meal the night before Overall  blood sugar will be significantly high at all different times, with sporadic readings over 250   His HYPOGLYCEMIA appears to be  occurring  occasionally around 7 PM but only once after an evening mealtime bolus Treatment of hypoglycemia: He is usually eating a half oatmeal cookie and will occasionally have rebound but this  EXERCISE: No program activity, he is generally moving around during the day    Lab Results  Component Value Date   HGBA1C 7.7* 06/12/2013   HGBA1C 7.8* 03/03/2013   HGBA1C 11.3* 08/22/2009   Lab Results  Component Value Date   MICROALBUR 2.9* 06/12/2013   LDLCALC 127* 06/12/2013   CREATININE 1.2 06/12/2013        Medication List       This list is accurate as of: 06/22/13  9:09 AM.  Always use your most recent med list.               AMPYRA 10 MG Tb12  Generic drug:  dalfampridine  10 mg.     glucose blood test strip  Commonly known as:  BAYER CONTOUR NEXT TEST  Use as instructed to check blood sugars 8 times per day dx code 250.03     insulin lispro 100 UNIT/ML injection  Commonly known as:  HUMALOG  Inject 80 units with pump per day     SYNTHROID 75 MCG tablet  Generic drug:  levothyroxine  Allergies: No Known Allergies  Past Medical History  Diagnosis Date  . Hyperlipidemia   . Hypertension   . Hypothyroidism   . 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 Multiple sclerosis   . Type 1 diabetes mellitus     No past surgical history on file.  Family History  Problem Relation Age of Onset  . Diabetes Sister   . Colon cancer Maternal Grandmother   . Colon polyps Maternal Uncle     Social History:  reports that he has never smoked. He has never used smokeless tobacco. He reports that he does not drink alcohol or use illicit drugs.    Review of Systems    HYPERCHOLESTEROLEMIA: He has been on and off pravastatin and not clear if this has been causing any abnormalities of his liver functions. Currently his LDL is high with stopping his pravastatin on the last visit. However his ALT is better and apparently does not have fatty liver on ultrasound. Not clear if  an explanation found by gastroenterologist for high liver enzymes  HYPERTENSION:  has not been on any regular treatment and blood pressure appears to be good without any ACE inhibitor now, also no history of microalbuminuria  NEUROPATHY: Present but has no recent symptoms  He has MULTIPLE sclerosis, on treatment and regular followup    Objective:   Physical Exam  BP 126/82  Pulse 81  Temp(Src) 98.3 F (36.8 C)  Resp 14  Ht 6\' 1"  (1.854 m)  Wt 218 lb 12.8 oz (99.247 kg)  BMI 28.87 kg/m2  SpO2 97%       Assessment:      DIABETES control: His blood sugar control is still quite inconsistent and overall somewhat high but A1c over 7% consistently See history of present illness for analysis of his current blood sugar patterns  Recommendations made today:  Use extra insulin and dual wave bolus for high fat meals that he has periodically in the evening  Avoid food for treating low blood sugars and use simple carbohydrates, about 15 g  Look into continuous glucose monitoring to improved blood sugar control, he will call [employer]. Also given brochure on DexCom  Call if blood sugars are consistently high at any given time  May try using temporary basal if very active to avoid low blood sugars  Will consider changing the sensitivity if blood sugars appear to be consistently high when doing correction boluses   HYPERCHOLESTEROLEMIA: Will again try 20 mg pravastatin since he has had a negative GI evaluation for persistently abnormal liver functions; these are appearing better now    HYPOTHYROIDISM: TSH normal, will continue same dose  Counseling time over 50% of today's 25 minute visit   Plan:     As above, no change in pump settings  [place]:  No visits with results within 1 Week(s) from this visit. Latest known visit with results is:  Appointment on 06/12/2013  Component Date Value Range Status  . TSH 06/12/2013 0.96  0.35 - 5.50 uIU/mL Final  . Microalb,  Ur 06/12/2013 2.9* 0.0 - 1.9 mg/dL Final  . Creatinine,U 06/12/2013 129.1   Final  . Microalb Creat Ratio 06/12/2013 2.2  0.0 - 30.0 mg/g Final  . Cholesterol 06/12/2013 196  0 - 200 mg/dL Final   ATP III Classification       Desirable:  < 200 mg/dL               Borderline High:  200 - 239 mg/dL  High:  > = 240 mg/dL  . Triglycerides 06/12/2013 69.0  0.0 - 149.0 mg/dL Final   Normal:  <150 mg/dLBorderline High:  150 - 199 mg/dL  . HDL 06/12/2013 55.50  >39.00 mg/dL Final  . VLDL 06/12/2013 13.8  0.0 - 40.0 mg/dL Final  . LDL Cholesterol 06/12/2013 127* 0 - 99 mg/dL Final  . Total CHOL/HDL Ratio 06/12/2013 4   Final                  Men          Women1/2 Average Risk     3.4          3.3Average Risk          5.0          4.42X Average Risk          9.6          7.13X Average Risk          15.0          11.0                      . Sodium 06/12/2013 137  135 - 145 mEq/L Final  . Potassium 06/12/2013 4.4  3.5 - 5.1 mEq/L Final  . Chloride 06/12/2013 104  96 - 112 mEq/L Final  . CO2 06/12/2013 26  19 - 32 mEq/L Final  . Glucose, Bld 06/12/2013 164* 70 - 99 mg/dL Final  . BUN 06/12/2013 15  6 - 23 mg/dL Final  . Creatinine, Ser 06/12/2013 1.2  0.4 - 1.5 mg/dL Final  . Total Bilirubin 06/12/2013 0.7  0.3 - 1.2 mg/dL Final  . Alkaline Phosphatase 06/12/2013 117  39 - 117 U/L Final  . AST 06/12/2013 40* 0 - 37 U/L Final  . ALT 06/12/2013 50  0 - 53 U/L Final  . Total Protein 06/12/2013 7.0  6.0 - 8.3 g/dL Final  . Albumin 06/12/2013 4.1  3.5 - 5.2 g/dL Final  . Calcium 06/12/2013 9.3  8.4 - 10.5 mg/dL Final  . GFR 06/12/2013 88.42  >60.00 mL/min Final  . Hemoglobin A1C 06/12/2013 7.7* 4.6 - 6.5 % Final   Glycemic Control Guidelines for People with Diabetes:Non Diabetic:  <6%Goal of Therapy: <7%Additional Action Suggested:  >8%

## 2013-06-22 NOTE — Patient Instructions (Signed)
Extra bolus for high fats  Restart Pravastatin 20mg 

## 2013-07-22 ENCOUNTER — Other Ambulatory Visit: Payer: Self-pay | Admitting: *Deleted

## 2013-07-22 MED ORDER — LEVOTHYROXINE SODIUM 75 MCG PO TABS: ORAL_TABLET | ORAL | Status: DC

## 2013-07-27 DIAGNOSIS — G35 Multiple sclerosis: Secondary | ICD-10-CM | POA: Diagnosis not present

## 2013-07-27 DIAGNOSIS — R5383 Other fatigue: Secondary | ICD-10-CM | POA: Diagnosis not present

## 2013-07-27 DIAGNOSIS — E1065 Type 1 diabetes mellitus with hyperglycemia: Secondary | ICD-10-CM | POA: Diagnosis not present

## 2013-07-27 DIAGNOSIS — R29898 Other symptoms and signs involving the musculoskeletal system: Secondary | ICD-10-CM | POA: Diagnosis not present

## 2013-07-27 DIAGNOSIS — Z5181 Encounter for therapeutic drug level monitoring: Secondary | ICD-10-CM | POA: Diagnosis not present

## 2013-07-27 DIAGNOSIS — R030 Elevated blood-pressure reading, without diagnosis of hypertension: Secondary | ICD-10-CM | POA: Diagnosis not present

## 2013-07-27 DIAGNOSIS — IMO0002 Reserved for concepts with insufficient information to code with codable children: Secondary | ICD-10-CM | POA: Diagnosis not present

## 2013-07-27 DIAGNOSIS — R269 Unspecified abnormalities of gait and mobility: Secondary | ICD-10-CM | POA: Diagnosis not present

## 2013-07-27 DIAGNOSIS — R5381 Other malaise: Secondary | ICD-10-CM | POA: Diagnosis not present

## 2013-07-27 DIAGNOSIS — Z79899 Other long term (current) drug therapy: Secondary | ICD-10-CM | POA: Diagnosis not present

## 2013-07-28 ENCOUNTER — Other Ambulatory Visit: Payer: Self-pay | Admitting: Urology

## 2013-07-28 DIAGNOSIS — G35 Multiple sclerosis: Secondary | ICD-10-CM

## 2013-08-04 ENCOUNTER — Other Ambulatory Visit: Payer: Medicare Other

## 2013-08-05 ENCOUNTER — Ambulatory Visit
Admission: RE | Admit: 2013-08-05 | Discharge: 2013-08-05 | Disposition: A | Discharge status: Home / Self Care | Payer: Medicare Other | Source: Ambulatory Visit | Attending: Urology | Admitting: Urology

## 2013-08-05 DIAGNOSIS — G35 Multiple sclerosis: Secondary | ICD-10-CM

## 2013-08-05 MED ORDER — GADOBENATE DIMEGLUMINE 529 MG/ML IV SOLN
20.0000 mL | Freq: Once | INTRAVENOUS | Status: AC | PRN
  Administered 2013-08-05: 20 mL via INTRAVENOUS

## 2013-08-20 ENCOUNTER — Other Ambulatory Visit: Payer: Self-pay | Admitting: *Deleted

## 2013-08-20 MED ORDER — LEVOTHYROXINE SODIUM 75 MCG PO TABS: ORAL_TABLET | ORAL | Status: DC

## 2013-08-26 ENCOUNTER — Other Ambulatory Visit (INDEPENDENT_AMBULATORY_CARE_PROVIDER_SITE_OTHER): Payer: Medicare Other

## 2013-08-26 ENCOUNTER — Ambulatory Visit (INDEPENDENT_AMBULATORY_CARE_PROVIDER_SITE_OTHER): Payer: Medicare Other | Admitting: Endocrinology

## 2013-08-26 ENCOUNTER — Telehealth: Payer: Self-pay | Admitting: Endocrinology

## 2013-08-26 ENCOUNTER — Encounter: Payer: Self-pay | Admitting: Endocrinology

## 2013-08-26 VITALS — BP 154/80 | HR 82 | Temp 98.1°F | Resp 14 | Ht 73.0 in | Wt 217.6 lb

## 2013-08-26 DIAGNOSIS — IMO0002 Reserved for concepts with insufficient information to code with codable children: Secondary | ICD-10-CM

## 2013-08-26 DIAGNOSIS — E1065 Type 1 diabetes mellitus with hyperglycemia: Secondary | ICD-10-CM

## 2013-08-26 DIAGNOSIS — E039 Hypothyroidism, unspecified: Secondary | ICD-10-CM

## 2013-08-26 DIAGNOSIS — R748 Abnormal levels of other serum enzymes: Secondary | ICD-10-CM | POA: Diagnosis not present

## 2013-08-26 DIAGNOSIS — E7800 Pure hypercholesterolemia, unspecified: Secondary | ICD-10-CM | POA: Diagnosis not present

## 2013-08-26 LAB — LIPID PANEL
Cholesterol: 192 mg/dL (ref 0–200)
HDL: 51.8 mg/dL (ref >39.00)
LDL Cholesterol: 124 mg/dL — ABNORMAL HIGH (ref 0–99)
Total CHOL/HDL Ratio: 4
Triglycerides: 82 mg/dL (ref 0.0–149.0)
VLDL: 16.4 mg/dL (ref 0.0–40.0)

## 2013-08-26 LAB — COMPREHENSIVE METABOLIC PANEL
ALT: 68 U/L — ABNORMAL HIGH (ref 0–53)
AST: 46 U/L — ABNORMAL HIGH (ref 0–37)
Albumin: 4.1 g/dL (ref 3.5–5.2)
Alkaline Phosphatase: 122 U/L — ABNORMAL HIGH (ref 39–117)
BUN: 20 mg/dL (ref 6–23)
CO2: 27 mEq/L (ref 19–32)
Calcium: 9.1 mg/dL (ref 8.4–10.5)
Chloride: 101 mEq/L (ref 96–112)
Creatinine, Ser: 1.3 mg/dL (ref 0.4–1.5)
GFR: 81.84 mL/min (ref >60.00)
Glucose, Bld: 216 mg/dL — ABNORMAL HIGH (ref 70–99)
Potassium: 4.3 mEq/L (ref 3.5–5.1)
Sodium: 135 mEq/L (ref 135–145)
Total Bilirubin: 0.5 mg/dL (ref 0.3–1.2)
Total Protein: 6.9 g/dL (ref 6.0–8.3)

## 2013-08-26 LAB — HEMOGLOBIN A1C: Hgb A1c MFr Bld: 7.5 % — ABNORMAL HIGH (ref 4.6–6.5)

## 2013-08-26 NOTE — Telephone Encounter (Signed)
Johnathan Ross healthcare # [id number] needs proof that he has been seen within the past 30 days. Office does not open until 830.

## 2013-08-26 NOTE — Telephone Encounter (Signed)
He needs an appt 

## 2013-08-26 NOTE — Patient Instructions (Signed)
   Use extra 2-4 units  insulin and dual wave bolus for high fat meals   Avoid food for treating low blood sugars and use simple carbohydrates, about 15 g  Look into continuous glucose monitoring to improved blood sugar control, he will call [employer].   Call if blood sugars are consistently high at any given time  May try using temporary basal of -50-75% if very active to avoid low blood sugars  More sugars at supper  Basal at 2 pm =1.2 Carb ratio at lunch 1: 11

## 2013-08-26 NOTE — Progress Notes (Signed)
Subjective:     Patient ID: Johnathan Ross, male   DOB: 1970-11-02, 43 y.o.   MRN: [id number]  Diabetes   Diagnosis: Type 1 diabetes mellitus, date of diagnosis: 1995.   Insulin Pump followup:   CURRENT brand:  Medtronic  HISTORY: An insulin pump has been in use Since 03/13/11.   PUMP settings are basal rate: 1.5 until 4 a.m; 4 AM = 1.3. 6 a.m.= 1.55. 11 a.m. = 0.9. 12 noon = 1.1, 2 p.m. = 1.3. 4 p.m. = 1.9 and 6 p.m. = 1.5 Carbohydrate to insulin ratio: 1:12, at 11 a.m. = 1: 9, after 6 p.m. = 1:7, hyperglycemia correction factor: 1:18,and after 10 p.m. 1:30, target 120, active insulin time 4 hours.  Bolus data: Has 5.6 boluses per day, 49 % with correction and no overriding Changing infusion set every 4 days  GLUCOSE data:   PREMEAL Breakfast Lunch Dinner Bedtime Overall  Glucose range:  87-208   53-246   89-256   64-320    Mean/median:  181    150   151+/-75   POST-MEAL PC Breakfast PC Lunch PC Dinner  Glucose range:   58-146    Mean/median:    133     HYPERGLYCEMIA:  Does not have any inconsistent pattern of high blood sugars although overall they are higher in the morning Also has some high readings around 1 PM and 8-9 PM    His HYPOGLYCEMIA appears to be  occurring periodically between 2-5 PM and can be within a couple of hours after his lunch bolus Treatment of hypoglycemia: He is usually eating a half oatmeal cookie   EXERCISE: He is doing some yardwork, will be doing more during summer   Lab Results  Component Value Date   HGBA1C 7.7* 06/12/2013   HGBA1C 7.8* 03/03/2013   HGBA1C 11.3* 08/22/2009   Lab Results  Component Value Date   MICROALBUR 2.9* 06/12/2013   LDLCALC 127* 06/12/2013   CREATININE 1.2 06/12/2013        Medication List       This list is accurate as of: 08/26/13  1:24 PM.  Always use your most recent med list.               AMPYRA 10 MG Tb12  Generic drug:  dalfampridine  10 mg.     glucose blood test strip  Commonly known as:   BAYER CONTOUR NEXT TEST  Use as instructed to check blood sugars 8 times per day dx code 250.03     insulin lispro 100 UNIT/ML injection  Commonly known as:  HUMALOG  Inject 80 units with pump per day     levothyroxine 75 MCG tablet  Commonly known as:  SYNTHROID  Take one tablet every morning on an empty stomach     pravastatin 20 MG tablet  Commonly known as:  PRAVACHOL  Take 1 tablet (20 mg total) by mouth daily.        Allergies: No Known Allergies  Past Medical History  Diagnosis Date  . Hyperlipidemia   . Hypertension   . Hypothyroidism   . 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 Multiple sclerosis   . Type 1 diabetes mellitus     No past surgical history on file.  Family History  Problem Relation Age of Onset  . Diabetes Sister   . Colon cancer Maternal Grandmother   . Colon polyps Maternal Uncle     Social History:  reports that he has never smoked.  He has never used smokeless tobacco. He reports that he does not drink alcohol or use illicit drugs.    Review of Systems    HYPERCHOLESTEROLEMIA: He has been on and off pravastatin and not clear if this has been causing any abnormalities of his liver functions. Currently his LDL is high with stopping his pravastatin on the last visit. However his ALT is better and apparently does not have fatty liver on ultrasound. Not clear if an explanation found by gastroenterologist for high liver enzymes  HYPERTENSION:  has not been on any regular treatment and blood pressure appears to be good without any ACE inhibitor now, also no history of microalbuminuria  NEUROPATHY: Present but has no recent symptoms  He has MULTIPLE sclerosis, on treatment and regular followup    Objective:   Physical Exam  BP 154/80  Pulse 82  Temp(Src) 98.1 F (36.7 C)  Resp 14  Ht 6\' 1"  (1.854 m)  Wt 217 lb 9.6 oz (98.703 kg)  BMI 28.72 kg/m2  SpO2 97%       Assessment:      DIABETES control: His blood sugar control is  still somewhat better than before with average reading is 151 Still has variability with standard deviation 75 Recently has higher readings on waking up Also his low blood sugars appear to be after his lunch and with activity in the afternoon See history of present illness for analysis of his current blood sugar patterns   HYPERCHOLESTEROLEMIA: Currently untreated. Levels to be checked to today  Will again try 20 mg pravastatin since he has had a negative GI evaluation for persistently abnormal liver functions    HYPOTHYROIDISM: TSH normal, will continue same dose  Increased blood pressure: Has not had this consistently and will continue to monitor.   Plan:      Recommendations made today for diabetes:  Increase basal rate at 6 AM to 1.65  Reduce basal rate at 2 PM to 1.2  Reduce carbohydrate coverage at lunch to 1:11  Use extra insulin and dual wave bolus for high fat meals including at lunch on weekends  Avoid food for treating low blood sugars and use simple carbohydrates, about 15 g  Look into continuous glucose monitoring to improved blood sugar control, he will call [employer].  Call if blood sugars are consistently high or low at any given time  May try using temporary basal if very active to avoid low blood sugars   Counseling time over 50% of today's 25 minute visit  [place]:  No visits with results within 1 Week(s) from this visit. Latest known visit with results is:  Appointment on 06/12/2013  Component Date Value Ref Range Status  . TSH 06/12/2013 0.96  0.35 - 5.50 uIU/mL Final  . Microalb, Ur 06/12/2013 2.9* 0.0 - 1.9 mg/dL Final  . Creatinine,U 06/12/2013 129.1   Final  . Microalb Creat Ratio 06/12/2013 2.2  0.0 - 30.0 mg/g Final  . Cholesterol 06/12/2013 196  0 - 200 mg/dL Final   ATP III Classification       Desirable:  < 200 mg/dL               Borderline High:  200 - 239 mg/dL          High:  > = 240 mg/dL  . Triglycerides 06/12/2013 69.0   0.0 - 149.0 mg/dL Final   Normal:  <150 mg/dLBorderline High:  150 - 199 mg/dL  . HDL 06/12/2013 55.50  >39.00  mg/dL Final  . VLDL 06/12/2013 13.8  0.0 - 40.0 mg/dL Final  . LDL Cholesterol 06/12/2013 127* 0 - 99 mg/dL Final  . Total CHOL/HDL Ratio 06/12/2013 4   Final                  Men          Women1/2 Average Risk     3.4          3.3Average Risk          5.0          4.42X Average Risk          9.6          7.13X Average Risk          15.0          11.0                      . Sodium 06/12/2013 137  135 - 145 mEq/L Final  . Potassium 06/12/2013 4.4  3.5 - 5.1 mEq/L Final  . Chloride 06/12/2013 104  96 - 112 mEq/L Final  . CO2 06/12/2013 26  19 - 32 mEq/L Final  . Glucose, Bld 06/12/2013 164* 70 - 99 mg/dL Final  . BUN 06/12/2013 15  6 - 23 mg/dL Final  . Creatinine, Ser 06/12/2013 1.2  0.4 - 1.5 mg/dL Final  . Total Bilirubin 06/12/2013 0.7  0.3 - 1.2 mg/dL Final  . Alkaline Phosphatase 06/12/2013 117  39 - 117 U/L Final  . AST 06/12/2013 40* 0 - 37 U/L Final  . ALT 06/12/2013 50  0 - 53 U/L Final  . Total Protein 06/12/2013 7.0  6.0 - 8.3 g/dL Final  . Albumin 06/12/2013 4.1  3.5 - 5.2 g/dL Final  . Calcium 06/12/2013 9.3  8.4 - 10.5 mg/dL Final  . GFR 06/12/2013 88.42  >60.00 mL/min Final  . Hemoglobin A1C 06/12/2013 7.7* 4.6 - 6.5 % Final   Glycemic Control Guidelines for People with Diabetes:Non Diabetic:  <6%Goal of Therapy: <7%Additional Action Suggested:  >8%

## 2013-08-27 ENCOUNTER — Other Ambulatory Visit: Payer: Self-pay | Admitting: *Deleted

## 2013-08-27 MED ORDER — PRAVASTATIN SODIUM 20 MG PO TABS: 20.0000 mg | ORAL_TABLET | Freq: Every day | ORAL | Status: DC

## 2013-08-27 NOTE — Progress Notes (Signed)
Quick Note:  Please let patient know that A1c slightly better at 7.5. Is he taking pravastatin: Cholesterol still high. Liver tests slightly high again ______

## 2013-10-08 DIAGNOSIS — E1039 Type 1 diabetes mellitus with other diabetic ophthalmic complication: Secondary | ICD-10-CM | POA: Diagnosis not present

## 2013-10-08 DIAGNOSIS — E11359 Type 2 diabetes mellitus with proliferative diabetic retinopathy without macular edema: Secondary | ICD-10-CM | POA: Diagnosis not present

## 2013-10-08 DIAGNOSIS — G6181 Chronic inflammatory demyelinating polyneuritis: Secondary | ICD-10-CM | POA: Diagnosis not present

## 2013-10-26 DIAGNOSIS — F330 Major depressive disorder, recurrent, mild: Secondary | ICD-10-CM | POA: Diagnosis not present

## 2013-12-15 DIAGNOSIS — S82839A Other fracture of upper and lower end of unspecified fibula, initial encounter for closed fracture: Secondary | ICD-10-CM | POA: Diagnosis not present

## 2013-12-15 DIAGNOSIS — S82843A Displaced bimalleolar fracture of unspecified lower leg, initial encounter for closed fracture: Secondary | ICD-10-CM | POA: Diagnosis not present

## 2013-12-15 DIAGNOSIS — M25579 Pain in unspecified ankle and joints of unspecified foot: Secondary | ICD-10-CM | POA: Diagnosis not present

## 2013-12-17 DIAGNOSIS — S93429A Sprain of deltoid ligament of unspecified ankle, initial encounter: Secondary | ICD-10-CM | POA: Diagnosis not present

## 2013-12-17 DIAGNOSIS — Y929 Unspecified place or not applicable: Secondary | ICD-10-CM | POA: Diagnosis not present

## 2013-12-17 DIAGNOSIS — Y999 Unspecified external cause status: Secondary | ICD-10-CM | POA: Diagnosis not present

## 2013-12-17 DIAGNOSIS — S82843A Displaced bimalleolar fracture of unspecified lower leg, initial encounter for closed fracture: Secondary | ICD-10-CM | POA: Diagnosis not present

## 2013-12-17 DIAGNOSIS — X58XXXA Exposure to other specified factors, initial encounter: Secondary | ICD-10-CM | POA: Diagnosis not present

## 2013-12-17 DIAGNOSIS — Y939 Activity, unspecified: Secondary | ICD-10-CM | POA: Diagnosis not present

## 2013-12-17 DIAGNOSIS — S8263XA Displaced fracture of lateral malleolus of unspecified fibula, initial encounter for closed fracture: Secondary | ICD-10-CM | POA: Diagnosis not present

## 2013-12-20 ENCOUNTER — Other Ambulatory Visit: Payer: Self-pay | Admitting: Endocrinology

## 2013-12-29 ENCOUNTER — Other Ambulatory Visit: Payer: Self-pay | Admitting: *Deleted

## 2013-12-29 MED ORDER — GLUCOSE BLOOD VI STRP: ORAL_STRIP | Status: DC

## 2014-01-07 ENCOUNTER — Other Ambulatory Visit: Payer: Medicare Other

## 2014-01-07 DIAGNOSIS — E1065 Type 1 diabetes mellitus with hyperglycemia: Secondary | ICD-10-CM

## 2014-01-07 DIAGNOSIS — IMO0002 Reserved for concepts with insufficient information to code with codable children: Secondary | ICD-10-CM | POA: Diagnosis not present

## 2014-01-07 DIAGNOSIS — E7800 Pure hypercholesterolemia, unspecified: Secondary | ICD-10-CM

## 2014-01-07 LAB — COMPREHENSIVE METABOLIC PANEL
ALT: 65 U/L — ABNORMAL HIGH (ref 0–53)
AST: 37 U/L (ref 0–37)
Albumin: 4.1 g/dL (ref 3.5–5.2)
Alkaline Phosphatase: 198 U/L — ABNORMAL HIGH (ref 39–117)
BUN: 19 mg/dL (ref 6–23)
CO2: 26 mEq/L (ref 19–32)
Calcium: 9.5 mg/dL (ref 8.4–10.5)
Chloride: 104 mEq/L (ref 96–112)
Creatinine, Ser: 1.3 mg/dL (ref 0.4–1.5)
GFR: 81.69 mL/min (ref >60.00)
Glucose, Bld: 152 mg/dL — ABNORMAL HIGH (ref 70–99)
Potassium: 4 mEq/L (ref 3.5–5.1)
Sodium: 138 mEq/L (ref 135–145)
Total Bilirubin: 0.4 mg/dL (ref 0.2–1.2)
Total Protein: 7.4 g/dL (ref 6.0–8.3)

## 2014-01-07 LAB — LIPID PANEL
Cholesterol: 185 mg/dL (ref 0–200)
HDL: 49.9 mg/dL (ref >39.00)
LDL Cholesterol: 122 mg/dL — ABNORMAL HIGH (ref 0–99)
NonHDL: 135.1
Total CHOL/HDL Ratio: 4
Triglycerides: 66 mg/dL (ref 0.0–149.0)
VLDL: 13.2 mg/dL (ref 0.0–40.0)

## 2014-01-07 LAB — MICROALBUMIN / CREATININE URINE RATIO
Creatinine,U: 187 mg/dL
Microalb Creat Ratio: 2.7 mg/g (ref 0.0–30.0)
Microalb, Ur: 5.1 mg/dL — ABNORMAL HIGH (ref 0.0–1.9)

## 2014-01-07 LAB — HEMOGLOBIN A1C: Hgb A1c MFr Bld: 7.7 % — ABNORMAL HIGH (ref 4.6–6.5)

## 2014-01-12 ENCOUNTER — Encounter: Payer: Self-pay | Admitting: Endocrinology

## 2014-01-12 ENCOUNTER — Ambulatory Visit (INDEPENDENT_AMBULATORY_CARE_PROVIDER_SITE_OTHER): Payer: Medicare Other | Admitting: Endocrinology

## 2014-01-12 VITALS — BP 112/76 | HR 86 | Temp 97.7°F | Resp 16 | Ht 73.0 in | Wt 212.8 lb

## 2014-01-12 DIAGNOSIS — E1065 Type 1 diabetes mellitus with hyperglycemia: Secondary | ICD-10-CM | POA: Diagnosis not present

## 2014-01-12 DIAGNOSIS — E7800 Pure hypercholesterolemia, unspecified: Secondary | ICD-10-CM

## 2014-01-12 DIAGNOSIS — R748 Abnormal levels of other serum enzymes: Secondary | ICD-10-CM

## 2014-01-12 DIAGNOSIS — E039 Hypothyroidism, unspecified: Secondary | ICD-10-CM

## 2014-01-12 DIAGNOSIS — IMO0002 Reserved for concepts with insufficient information to code with codable children: Secondary | ICD-10-CM | POA: Diagnosis not present

## 2014-01-12 DIAGNOSIS — I10 Essential (primary) hypertension: Secondary | ICD-10-CM

## 2014-01-12 NOTE — Progress Notes (Signed)
Subjective:     Patient ID: Johnathan Ross, male   DOB: [birth year], 43 y.o.   MRN: [id number]  Diabetes   Diagnosis: Type 1 diabetes mellitus, date of diagnosis: 1995.   Insulin Pump followup:   CURRENT brand:  Medtronic  HISTORY: An insulin pump has been in use Since 03/13/11.   PUMP settings are basal rate: 1.5 until 4 a.m; 4 AM = 1.3. 6 a.m.= 1.55. 11 a.m. = 0.9. 12 noon = 1.1, 2 p.m. = 1.2. 4 p.m. = 1.9 and 6 p.m. = 1.5  Carbohydrate to insulin ratio: 1:12, at 11 a.m. = 1: 11, after 6 p.m. = 1:7, hyperglycemia correction factor: 1:18,and after 10 p.m. 1:30, target 120, active insulin time 4 hours.  Bolus data: Has 6.6 boluses per day, 76 % with correction and no overriding Changing infusion set every 3-4 days  GLUCOSE data:   PREMEAL Breakfast Lunch Dinner Bedtime Overall  Glucose range:  91-272   117-289   78-281   80-238    Mean/median:  175     146   175+/-165    POST-MEAL PC Breakfast PC Lunch PC Dinner  Glucose range:    49-207   Mean/median:   244   143     HYPERGLYCEMIA:  Blood sugars are consistently high midday and afternoon Has also some high readings in the mornings and late at night but not in the morning recently Does not have any inconsistent pattern especially in the morning   His HYPOGLYCEMIA appears to be  occurring periodically between 7-11 PM and can be within a couple of hours after his dinner bolus Treatment of hypoglycemia: He is usually eating a half oatmeal cookie   EXERCISE: None for the last 3 weeks after his ankle fracture   Lab Results  Component Value Date   HGBA1C 7.7* 01/07/2014   HGBA1C 7.5* 08/26/2013   HGBA1C 7.7* 06/12/2013   Lab Results  Component Value Date   MICROALBUR 5.1* 01/07/2014   LDLCALC 122* 01/07/2014   CREATININE 1.3 01/07/2014        Medication List       This list is accurate as of: 01/12/14 10:50 AM.  Always use your most recent med list.               AMPYRA 10 MG Tb12  Generic drug:  dalfampridine  10  mg.     glucose blood test strip  Commonly known as:  BAYER CONTOUR NEXT TEST  Use as instructed to check blood sugars 8 times per day dx code 250.03     insulin lispro 100 UNIT/ML injection  Commonly known as:  HUMALOG  Inject 80 units with pump per day     levothyroxine 75 MCG tablet  Commonly known as:  SYNTHROID  Take one tablet every morning on an empty stomach     pravastatin 20 MG tablet  Commonly known as:  PRAVACHOL  TAKE 1 TABLET (20 MG TOTAL) BY MOUTH DAILY.        Allergies: No Known Allergies  Past Medical History  Diagnosis Date  . Hyperlipidemia   . Hypertension   . Hypothyroidism   . 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 Multiple sclerosis   . Type 1 diabetes mellitus     No past surgical history on file.  Family History  Problem Relation Age of Onset  . Diabetes Sister   . Colon cancer Maternal Grandmother   . Colon polyps Maternal Uncle  Social History:  reports that he has never smoked. He has never used smokeless tobacco. He reports that he does not drink alcohol or use illicit drugs.    Review of Systems    HYPERCHOLESTEROLEMIA: He has been on and off pravastatin and not clear if this has been causing any abnormalities of his liver functions.  He was not on pravastatin and liver functions were better on the last visit but they are relatively higher now, mostly alkaline phosphatase which may be from recent ankle fracture. Taking 20 g pravastatin  Lab Results  Component Value Date   CHOL 185 01/07/2014   HDL 49.90 01/07/2014   LDLCALC 122* 01/07/2014   TRIG 66.0 01/07/2014   CHOLHDL 4 01/07/2014    HYPERTENSION:  has not been on any recent treatment and blood pressure appears to be good without any ACE inhibitor now, also no history of microalbuminuria  NEUROPATHY: Present but has no recent symptoms  He has MULTIPLE sclerosis, on treatment and regular followup    Objective:   Physical Exam  BP 149/90  Pulse 86   Temp(Src) 97.7 F (36.5 C)  Resp 16  Ht 6\' 1"  (1.854 m)  Wt 212 lb 12.8 oz (96.525 kg)  BMI 28.08 kg/m2  SpO2 99%       Assessment:      DIABETES control: His blood sugar control is erratic and overall higher than the last time recently Most of this is related to his inactivity in the last 3 weeks after his ankle fracture See history of present illness for analysis of his current blood sugar patterns  He appears to be needing more insulin midday and afternoon because of his relative inactivity  Not clear why his morning sugars are fluctuating significantly    Also at the same time he is tending to have low readings late in the evening including after his evening boluses  He tends to have persistently high readings despite doing correction boluses during the daytime  Mealtime coverage appears to be relatively good after breakfast and lunch   HYPERCHOLESTEROLEMIA:  Mildly increased LDL  Will Continue 20 mg pravastatin since he has had a negative GI evaluation for persistently abnormal liver functions and continue to monitor liver functions, currently only mild increase in ALT     HYPOTHYROIDISM: TSH to be checked again on his next visit   Plan:      Increase basal rate at 11 AM to 1.0 MI at 12 noon to 1.25 and 2 PM up to 1.3 Reduce basal rate at 6 PM to 1.4 Carbohydrate coverage at lunch to be 1:9 and the same at suppertime   Will consider changing his correction factor on his next visit if he is still not sensitive to boluses during the day  Extended boluses for higher fat meals Change back to original basal rates when starting to be more active   Counseling time over 50% of today's 25 minute visit  [place]:  Appointment on 01/07/2014  Component Date Value Ref Range Status  . Microalb, Ur 01/07/2014 5.1* 0.0 - 1.9 mg/dL Final  . Creatinine,U 01/07/2014 187.0   Final  . Microalb Creat Ratio 01/07/2014 2.7  0.0 - 30.0 mg/g Final  . Hemoglobin A1C 01/07/2014  7.7* 4.6 - 6.5 % Final   Glycemic Control Guidelines for People with Diabetes:Non Diabetic:  <6%Goal of Therapy: <7%Additional Action Suggested:  >8%   . Sodium 01/07/2014 138  135 - 145 mEq/L Final  . Potassium 01/07/2014 4.0  3.5 - 5.1 mEq/L Final  . Chloride 01/07/2014 104  96 - 112 mEq/L Final  . CO2 01/07/2014 26  19 - 32 mEq/L Final  . Glucose, Bld 01/07/2014 152* 70 - 99 mg/dL Final  . BUN 01/07/2014 19  6 - 23 mg/dL Final  . Creatinine, Ser 01/07/2014 1.3  0.4 - 1.5 mg/dL Final  . Total Bilirubin 01/07/2014 0.4  0.2 - 1.2 mg/dL Final  . Alkaline Phosphatase 01/07/2014 198* 39 - 117 U/L Final  . AST 01/07/2014 37  0 - 37 U/L Final  . ALT 01/07/2014 65* 0 - 53 U/L Final  . Total Protein 01/07/2014 7.4  6.0 - 8.3 g/dL Final  . Albumin 01/07/2014 4.1  3.5 - 5.2 g/dL Final  . Calcium 01/07/2014 9.5  8.4 - 10.5 mg/dL Final  . GFR 01/07/2014 81.69  >60.00 mL/min Final  . Cholesterol 01/07/2014 185  0 - 200 mg/dL Final   ATP III Classification       Desirable:  < 200 mg/dL               Borderline High:  200 - 239 mg/dL          High:  > = 240 mg/dL  . Triglycerides 01/07/2014 66.0  0.0 - 149.0 mg/dL Final   Normal:  <150 mg/dLBorderline High:  150 - 199 mg/dL  . HDL 01/07/2014 49.90  >39.00 mg/dL Final  . VLDL 01/07/2014 13.2  0.0 - 40.0 mg/dL Final  . LDL Cholesterol 01/07/2014 122* 0 - 99 mg/dL Final  . Total CHOL/HDL Ratio 01/07/2014 4   Final                  Men          Women1/2 Average Risk     3.4          3.3Average Risk          5.0          4.42X Average Risk          9.6          7.13X Average Risk          15.0          11.0                      . NonHDL 01/07/2014 135.10   Final   NOTE:  Non-HDL goal should be 30 mg/dL higher than patient's LDL goal (i.e. LDL goal of < 70 mg/dL, would have non-HDL goal of < 100 mg/dL)

## 2014-02-03 DIAGNOSIS — S93429A Sprain of deltoid ligament of unspecified ankle, initial encounter: Secondary | ICD-10-CM | POA: Diagnosis not present

## 2014-02-11 DIAGNOSIS — E1069 Type 1 diabetes mellitus with other specified complication: Secondary | ICD-10-CM | POA: Diagnosis not present

## 2014-02-11 DIAGNOSIS — R269 Unspecified abnormalities of gait and mobility: Secondary | ICD-10-CM | POA: Diagnosis not present

## 2014-02-11 DIAGNOSIS — R5381 Other malaise: Secondary | ICD-10-CM | POA: Diagnosis not present

## 2014-02-11 DIAGNOSIS — Z5181 Encounter for therapeutic drug level monitoring: Secondary | ICD-10-CM | POA: Diagnosis not present

## 2014-02-11 DIAGNOSIS — M6281 Muscle weakness (generalized): Secondary | ICD-10-CM | POA: Diagnosis not present

## 2014-02-11 DIAGNOSIS — G35 Multiple sclerosis: Secondary | ICD-10-CM | POA: Diagnosis not present

## 2014-02-11 DIAGNOSIS — E559 Vitamin D deficiency, unspecified: Secondary | ICD-10-CM | POA: Diagnosis not present

## 2014-02-11 DIAGNOSIS — R5383 Other fatigue: Secondary | ICD-10-CM | POA: Diagnosis not present

## 2014-02-11 DIAGNOSIS — D729 Disorder of white blood cells, unspecified: Secondary | ICD-10-CM | POA: Diagnosis not present

## 2014-02-11 DIAGNOSIS — R030 Elevated blood-pressure reading, without diagnosis of hypertension: Secondary | ICD-10-CM | POA: Diagnosis not present

## 2014-02-11 DIAGNOSIS — Z79899 Other long term (current) drug therapy: Secondary | ICD-10-CM | POA: Diagnosis not present

## 2014-02-19 DIAGNOSIS — S93429A Sprain of deltoid ligament of unspecified ankle, initial encounter: Secondary | ICD-10-CM | POA: Diagnosis not present

## 2014-02-19 DIAGNOSIS — R262 Difficulty in walking, not elsewhere classified: Secondary | ICD-10-CM | POA: Diagnosis not present

## 2014-02-19 DIAGNOSIS — M25579 Pain in unspecified ankle and joints of unspecified foot: Secondary | ICD-10-CM | POA: Diagnosis not present

## 2014-02-19 DIAGNOSIS — S82209A Unspecified fracture of shaft of unspecified tibia, initial encounter for closed fracture: Secondary | ICD-10-CM | POA: Diagnosis not present

## 2014-02-19 DIAGNOSIS — S82409A Unspecified fracture of shaft of unspecified fibula, initial encounter for closed fracture: Secondary | ICD-10-CM | POA: Diagnosis not present

## 2014-03-22 DIAGNOSIS — D729 Disorder of white blood cells, unspecified: Secondary | ICD-10-CM | POA: Diagnosis not present

## 2014-03-24 ENCOUNTER — Other Ambulatory Visit: Payer: Self-pay | Admitting: *Deleted

## 2014-03-24 MED ORDER — INSULIN LISPRO 100 UNIT/ML ~~LOC~~ SOLN: SUBCUTANEOUS | Status: DC

## 2014-03-31 DIAGNOSIS — S82842D Displaced bimalleolar fracture of left lower leg, subsequent encounter for closed fracture with routine healing: Secondary | ICD-10-CM | POA: Diagnosis not present

## 2014-04-09 ENCOUNTER — Other Ambulatory Visit (INDEPENDENT_AMBULATORY_CARE_PROVIDER_SITE_OTHER): Payer: Medicare Other

## 2014-04-09 DIAGNOSIS — E1065 Type 1 diabetes mellitus with hyperglycemia: Secondary | ICD-10-CM | POA: Diagnosis not present

## 2014-04-09 DIAGNOSIS — E039 Hypothyroidism, unspecified: Secondary | ICD-10-CM

## 2014-04-09 DIAGNOSIS — E7800 Pure hypercholesterolemia, unspecified: Secondary | ICD-10-CM

## 2014-04-09 DIAGNOSIS — IMO0002 Reserved for concepts with insufficient information to code with codable children: Secondary | ICD-10-CM

## 2014-04-09 LAB — COMPREHENSIVE METABOLIC PANEL
ALT: 39 U/L (ref 0–53)
AST: 30 U/L (ref 0–37)
Albumin: 3.5 g/dL (ref 3.5–5.2)
Alkaline Phosphatase: 103 U/L (ref 39–117)
BUN: 18 mg/dL (ref 6–23)
CO2: 23 mEq/L (ref 19–32)
Calcium: 9 mg/dL (ref 8.4–10.5)
Chloride: 103 mEq/L (ref 96–112)
Creatinine, Ser: 1.2 mg/dL (ref 0.4–1.5)
GFR: 89.83 mL/min (ref >60.00)
Glucose, Bld: 194 mg/dL — ABNORMAL HIGH (ref 70–99)
Potassium: 3.9 mEq/L (ref 3.5–5.1)
Sodium: 136 mEq/L (ref 135–145)
Total Bilirubin: 0.5 mg/dL (ref 0.2–1.2)
Total Protein: 7 g/dL (ref 6.0–8.3)

## 2014-04-09 LAB — MICROALBUMIN / CREATININE URINE RATIO
Creatinine,U: 158.3 mg/dL
Microalb Creat Ratio: 2.8 mg/g (ref 0.0–30.0)
Microalb, Ur: 4.5 mg/dL — ABNORMAL HIGH (ref 0.0–1.9)

## 2014-04-09 LAB — TSH: TSH: 1.74 u[IU]/mL (ref 0.35–4.50)

## 2014-04-09 LAB — HEMOGLOBIN A1C: Hgb A1c MFr Bld: 7.9 % — ABNORMAL HIGH (ref 4.6–6.5)

## 2014-04-09 LAB — LDL CHOLESTEROL, DIRECT: Direct LDL: 139.7 mg/dL

## 2014-04-14 ENCOUNTER — Ambulatory Visit (INDEPENDENT_AMBULATORY_CARE_PROVIDER_SITE_OTHER): Payer: Medicare Other | Admitting: Endocrinology

## 2014-04-14 ENCOUNTER — Encounter: Payer: Self-pay | Admitting: Endocrinology

## 2014-04-14 VITALS — BP 132/88 | HR 84 | Temp 98.2°F | Resp 14 | Ht 73.0 in | Wt 218.8 lb

## 2014-04-14 DIAGNOSIS — E063 Autoimmune thyroiditis: Secondary | ICD-10-CM

## 2014-04-14 DIAGNOSIS — E1065 Type 1 diabetes mellitus with hyperglycemia: Secondary | ICD-10-CM | POA: Diagnosis not present

## 2014-04-14 DIAGNOSIS — Z23 Encounter for immunization: Secondary | ICD-10-CM

## 2014-04-14 DIAGNOSIS — E7800 Pure hypercholesterolemia, unspecified: Secondary | ICD-10-CM

## 2014-04-14 DIAGNOSIS — E038 Other specified hypothyroidism: Secondary | ICD-10-CM

## 2014-04-14 DIAGNOSIS — I10 Essential (primary) hypertension: Secondary | ICD-10-CM

## 2014-04-14 DIAGNOSIS — IMO0002 Reserved for concepts with insufficient information to code with codable children: Secondary | ICD-10-CM

## 2014-04-14 MED ORDER — SIMVASTATIN 20 MG PO TABS
20.0000 mg | ORAL_TABLET | Freq: Every day | ORAL | Status: DC
Start: 2014-04-14 — End: 2014-12-19

## 2014-04-14 MED ORDER — LISINOPRIL 10 MG PO TABS: 10.0000 mg | ORAL_TABLET | Freq: Every day | ORAL | Status: DC

## 2014-04-14 NOTE — Progress Notes (Signed)
Subjective:     Patient ID: Johnathan Ross, male   DOB: 10-Jan-1971, 43 y.o.   MRN: [id number]  Diabetes   Diagnosis: Type 1 diabetes mellitus, date of diagnosis: 1995.   Insulin Pump followup:   CURRENT brand:  Medtronic  HISTORY: An insulin pump has been in use Since 03/13/11.   PUMP settings are basal rate:  1.5 until 4 a.m; 4 AM = 1.3. 6 a.m.= 1.55. 11 a.m. = 0.9. 12 noon = 1.1, 2 p.m. = 1.2. 4 p.m. = 1.9 and 6 p.m. = 1.5  Carbohydrate to insulin ratio: 1:12, at 11 a.m. = 1: 11, after 6 p.m. = 1:7, hyperglycemia correction factor: 1:18,and after 10 p.m. 1:30, target 120, active insulin time 4 hours.  Bolus data: Has 6.3 boluses per day, 71 % with correction and no overriding Changing infusion set every 3-4 days  He did not increase his basal rates as directed on his last visit  GLUCOSE data:   PREMEAL Breakfast Lunch Dinner Bedtime Overall  Glucose range: 89-274 72-297 87-263 58-361   Mean/median: 170   194 183+/-77    HYPERGLYCEMIA:  Blood sugars are variably high throughout the day but more consistently high around 5-6 PM They are variably high at bedtime and usually somewhat high overnight Although on an average blood sugars are better in the morning they are mostly above 140 Other patterns:  Blood sugars are generally fairly good between about 12 noon-2 PM  Has also some high readings in the mornings and late at night but not in the morning recently  Does not have any inconsistent pattern especially in the Late evening  May tend to have some overcorrection of his high readings at times  Occasionally gets significant rebound after having hypoglycemia   His HYPOGLYCEMIA appears to be infrequent and has a couple of episodes related to correction dose in the afternoon or after her evening meal Treatment of hypoglycemia: He is usually eating a half oatmeal cookie   EXERCISE: generally active during the afternoon  Wt Readings from Last 3 Encounters:  04/14/14 218  lb 12.8 oz (99.247 kg)  01/12/14 212 lb 12.8 oz (96.525 kg)  08/26/13 217 lb 9.6 oz (98.703 kg)     Lab Results  Component Value Date   HGBA1C 7.9* 04/09/2014   HGBA1C 7.7* 01/07/2014   HGBA1C 7.5* 08/26/2013   Lab Results  Component Value Date   MICROALBUR 4.5* 04/09/2014   LDLCALC 122* 01/07/2014   CREATININE 1.2 04/09/2014        Medication List       This list is accurate as of: 04/14/14  8:36 AM.  Always use your most recent med list.               AMPYRA 10 MG Tb12  Generic drug:  dalfampridine  10 mg.     glucose blood test strip  Commonly known as:  BAYER CONTOUR NEXT TEST  Use as instructed to check blood sugars 8 times per day dx code 250.03     insulin lispro 100 UNIT/ML injection  Commonly known as:  HUMALOG  Inject 80 units with pump per day     levothyroxine 75 MCG tablet  Commonly known as:  SYNTHROID  Take one tablet every morning on an empty stomach     pravastatin 20 MG tablet  Commonly known as:  PRAVACHOL  TAKE 1 TABLET (20 MG TOTAL) BY MOUTH DAILY.        Allergies: No Known Allergies  Past Medical History  Diagnosis Date  . Hyperlipidemia   . Hypertension   . Hypothyroidism   . 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 Multiple sclerosis   . Type 1 diabetes mellitus     No past surgical history on file.  Family History  Problem Relation Age of Onset  . Diabetes Sister   . Colon cancer Maternal Grandmother   . Colon polyps Maternal Uncle     Social History:  reports that he has never smoked. He has never used smokeless tobacco. He reports that he does not drink alcohol or use illicit drugs.    Review of Systems    HYPERCHOLESTEROLEMIA: He has been on and off pravastatin and not clear if this has been causing any abnormalities of his liver functions.   Off pravastatin and liver functions are normal  Lab Results  Component Value Date   CHOL 185 01/07/2014   HDL 49.90 01/07/2014   LDLCALC 122* 01/07/2014    LDLDIRECT 139.7 04/09/2014   TRIG 66.0 01/07/2014   CHOLHDL 4 01/07/2014    HYPERTENSION:  has not been on any recent treatment and blood pressure appears to be higher now  without any ACE inhibitor now, also no history of microalbuminuria  NEUROPATHY: Present but has no recent symptoms  He has MULTIPLE sclerosis, on treatment and regular followup  HYPOTHYROIDISM: Adequately controlled with 75 g Without needing any changes  Lab Results  Component Value Date   TSH 1.74 04/09/2014   TSH 0.96 06/12/2013   TSH 1.28 03/03/2013       Objective:   Physical Exam  BP 132/88 mmHg  Pulse 84  Temp(Src) 98.2 F (36.8 C)  Resp 14  Ht 6\' 1"  (1.854 m)  Wt 218 lb 12.8 oz (99.247 kg)  BMI 28.87 kg/m2  SpO2 99%       Assessment:      DIABETES control: His blood sugar control is again difficult and has variable blood sugar patterns throughout the day A1c is slightly higher than before See history of present illness for analysis of his current blood sugar patterns and problems  He has mostly higher readings around supper time  Also less he takes a correction bolus during the night he will have high readings fasting  May tend to have some overcorrection of his high readings at times  Occasionally gets significant rebound after having hypoglycemia  Not using extra insulin or dual wave boluses for high fat meals and he doesn't appear to understand how to do this    HYPERCHOLESTEROLEMIA:   Needs to be on a statin drug as LDL is 139 and will try him on simvastatin as he may have had increased liver functions with pravastatin  HYPERTENSION: His blood pressure is now consistently high and will start him on lisinopril again    HYPOTHYROIDISM: TSH normal   Plan:      Increase basal rate at 4 AM to 1.4; at 4 PM to 2.0 and at 6 PM to 1.6 Correction factor 1: 25 Extended boluses for about 2 hours for higher fat meals and adjust based on type of meal and blood sugar  patterns  Start lisinopril as above Trial of simvastatin instead of pravastatin  Counseling time over 50% of today's 25 minute visit  [place]:  Appointment on 04/09/2014  Component Date Value Ref Range Status  . Hgb A1c MFr Bld 04/09/2014 7.9* 4.6 - 6.5 % Final   Glycemic Control Guidelines for People with Diabetes:Non Diabetic:  <  6%Goal of Therapy: <7%Additional Action Suggested:  >8%   . Sodium 04/09/2014 136  135 - 145 mEq/L Final  . Potassium 04/09/2014 3.9  3.5 - 5.1 mEq/L Final  . Chloride 04/09/2014 103  96 - 112 mEq/L Final  . CO2 04/09/2014 23  19 - 32 mEq/L Final  . Glucose, Bld 04/09/2014 194* 70 - 99 mg/dL Final  . BUN 04/09/2014 18  6 - 23 mg/dL Final  . Creatinine, Ser 04/09/2014 1.2  0.4 - 1.5 mg/dL Final  . Total Bilirubin 04/09/2014 0.5  0.2 - 1.2 mg/dL Final  . Alkaline Phosphatase 04/09/2014 103  39 - 117 U/L Final  . AST 04/09/2014 30  0 - 37 U/L Final  . ALT 04/09/2014 39  0 - 53 U/L Final  . Total Protein 04/09/2014 7.0  6.0 - 8.3 g/dL Final  . Albumin 04/09/2014 3.5  3.5 - 5.2 g/dL Final  . Calcium 04/09/2014 9.0  8.4 - 10.5 mg/dL Final  . GFR 04/09/2014 89.83  >60.00 mL/min Final  . Direct LDL 04/09/2014 139.7   Final   Optimal:  <100 mg/dLNear or Above Optimal:  100-129 mg/dLBorderline High:  130-159 mg/dLHigh:  160-189 mg/dLVery High:  >190 mg/dL  . TSH 04/09/2014 1.74  0.35 - 4.50 uIU/mL Final  . Microalb, Ur 04/09/2014 4.5* 0.0 - 1.9 mg/dL Final  . Creatinine,U 04/09/2014 158.3   Final  . Microalb Creat Ratio 04/09/2014 2.8  0.0 - 30.0 mg/g Final

## 2014-04-14 NOTE — Patient Instructions (Signed)
Extended boluses with entering extra 15-30 Carbs for higher fat meals  Restart Lisinopril, start Simvastatin

## 2014-04-14 NOTE — Addendum Note (Signed)
Addended by: Elayne Snare on: 04/14/2014 12:55 PM   Modules accepted: Orders, Medications

## 2014-04-21 DIAGNOSIS — M6281 Muscle weakness (generalized): Secondary | ICD-10-CM | POA: Diagnosis not present

## 2014-04-21 DIAGNOSIS — Z5181 Encounter for therapeutic drug level monitoring: Secondary | ICD-10-CM | POA: Diagnosis not present

## 2014-04-21 DIAGNOSIS — Z111 Encounter for screening for respiratory tuberculosis: Secondary | ICD-10-CM | POA: Diagnosis not present

## 2014-04-21 DIAGNOSIS — R5383 Other fatigue: Secondary | ICD-10-CM | POA: Diagnosis not present

## 2014-04-21 DIAGNOSIS — G35 Multiple sclerosis: Secondary | ICD-10-CM | POA: Diagnosis not present

## 2014-04-21 DIAGNOSIS — R269 Unspecified abnormalities of gait and mobility: Secondary | ICD-10-CM | POA: Diagnosis not present

## 2014-04-21 DIAGNOSIS — D729 Disorder of white blood cells, unspecified: Secondary | ICD-10-CM | POA: Diagnosis not present

## 2014-04-21 DIAGNOSIS — Z79899 Other long term (current) drug therapy: Secondary | ICD-10-CM | POA: Diagnosis not present

## 2014-04-27 DIAGNOSIS — E11359 Type 2 diabetes mellitus with proliferative diabetic retinopathy without macular edema: Secondary | ICD-10-CM | POA: Diagnosis not present

## 2014-04-27 DIAGNOSIS — E1039 Type 1 diabetes mellitus with other diabetic ophthalmic complication: Secondary | ICD-10-CM | POA: Diagnosis not present

## 2014-05-17 ENCOUNTER — Other Ambulatory Visit: Payer: Self-pay | Admitting: *Deleted

## 2014-05-17 MED ORDER — GLUCOSE BLOOD VI STRP: ORAL_STRIP | Status: DC

## 2014-05-21 ENCOUNTER — Other Ambulatory Visit: Payer: Self-pay | Admitting: Endocrinology

## 2014-05-28 ENCOUNTER — Other Ambulatory Visit: Payer: Self-pay | Admitting: Endocrinology

## 2014-06-09 ENCOUNTER — Other Ambulatory Visit: Payer: Self-pay | Admitting: Urology

## 2014-06-09 DIAGNOSIS — G35 Multiple sclerosis: Secondary | ICD-10-CM

## 2014-06-14 ENCOUNTER — Telehealth: Payer: Self-pay | Admitting: Endocrinology

## 2014-06-14 DIAGNOSIS — G35 Multiple sclerosis: Secondary | ICD-10-CM | POA: Diagnosis not present

## 2014-06-14 NOTE — Telephone Encounter (Signed)
Please see below and advise.

## 2014-06-14 NOTE — Telephone Encounter (Signed)
He is on unknown intravenous steroid for the next 5 days, glucose over 300 now, advised patient to use 200% basal rate while on steroids and double his boluses until blood sugars are controlled

## 2014-06-14 NOTE — Telephone Encounter (Signed)
Pt was put on steriods for the next 5 days. Please advise pt on if there are changes he needs to do for his insulin pump

## 2014-06-15 DIAGNOSIS — G35 Multiple sclerosis: Secondary | ICD-10-CM | POA: Diagnosis not present

## 2014-06-16 DIAGNOSIS — G35 Multiple sclerosis: Secondary | ICD-10-CM | POA: Diagnosis not present

## 2014-06-16 DIAGNOSIS — D729 Disorder of white blood cells, unspecified: Secondary | ICD-10-CM | POA: Diagnosis not present

## 2014-06-17 DIAGNOSIS — G35 Multiple sclerosis: Secondary | ICD-10-CM | POA: Diagnosis not present

## 2014-06-18 DIAGNOSIS — G35 Multiple sclerosis: Secondary | ICD-10-CM | POA: Diagnosis not present

## 2014-06-21 ENCOUNTER — Other Ambulatory Visit: Payer: Medicare Other

## 2014-06-28 ENCOUNTER — Other Ambulatory Visit: Payer: Medicare Other

## 2014-07-12 ENCOUNTER — Other Ambulatory Visit: Payer: Medicare Other

## 2014-07-13 NOTE — Telephone Encounter (Signed)
error 

## 2014-07-14 ENCOUNTER — Ambulatory Visit
Admission: RE | Admit: 2014-07-14 | Discharge: 2014-07-14 | Disposition: A | Discharge status: Home / Self Care | Payer: Medicare Other | Source: Ambulatory Visit | Attending: Urology | Admitting: Urology

## 2014-07-14 DIAGNOSIS — G35 Multiple sclerosis: Secondary | ICD-10-CM

## 2014-07-14 DIAGNOSIS — M47812 Spondylosis without myelopathy or radiculopathy, cervical region: Secondary | ICD-10-CM | POA: Diagnosis not present

## 2014-07-14 MED ORDER — GADOBENATE DIMEGLUMINE 529 MG/ML IV SOLN
20.0000 mL | Freq: Once | INTRAVENOUS | Status: AC | PRN
  Administered 2014-07-14: 20 mL via INTRAVENOUS

## 2014-07-15 ENCOUNTER — Ambulatory Visit: Payer: Medicare Other | Admitting: Endocrinology

## 2014-07-22 DIAGNOSIS — D729 Disorder of white blood cells, unspecified: Secondary | ICD-10-CM | POA: Diagnosis not present

## 2014-07-22 DIAGNOSIS — G35 Multiple sclerosis: Secondary | ICD-10-CM | POA: Diagnosis not present

## 2014-07-22 DIAGNOSIS — E559 Vitamin D deficiency, unspecified: Secondary | ICD-10-CM | POA: Diagnosis not present

## 2014-07-22 DIAGNOSIS — Z79899 Other long term (current) drug therapy: Secondary | ICD-10-CM | POA: Diagnosis not present

## 2014-07-22 DIAGNOSIS — Z5181 Encounter for therapeutic drug level monitoring: Secondary | ICD-10-CM | POA: Diagnosis not present

## 2014-07-22 DIAGNOSIS — R7989 Other specified abnormal findings of blood chemistry: Secondary | ICD-10-CM | POA: Diagnosis not present

## 2014-07-22 DIAGNOSIS — R269 Unspecified abnormalities of gait and mobility: Secondary | ICD-10-CM | POA: Diagnosis not present

## 2014-07-22 DIAGNOSIS — R5383 Other fatigue: Secondary | ICD-10-CM | POA: Diagnosis not present

## 2014-07-22 DIAGNOSIS — M6281 Muscle weakness (generalized): Secondary | ICD-10-CM | POA: Diagnosis not present

## 2014-07-26 ENCOUNTER — Other Ambulatory Visit (INDEPENDENT_AMBULATORY_CARE_PROVIDER_SITE_OTHER): Payer: Medicare Other

## 2014-07-26 DIAGNOSIS — E7800 Pure hypercholesterolemia, unspecified: Secondary | ICD-10-CM

## 2014-07-26 DIAGNOSIS — IMO0002 Reserved for concepts with insufficient information to code with codable children: Secondary | ICD-10-CM

## 2014-07-26 DIAGNOSIS — E1065 Type 1 diabetes mellitus with hyperglycemia: Secondary | ICD-10-CM | POA: Diagnosis not present

## 2014-07-26 LAB — COMPREHENSIVE METABOLIC PANEL
ALT: 25 U/L (ref 0–53)
AST: 27 U/L (ref 0–37)
Albumin: 4.2 g/dL (ref 3.5–5.2)
Alkaline Phosphatase: 87 U/L (ref 39–117)
BUN: 15 mg/dL (ref 6–23)
CO2: 30 mEq/L (ref 19–32)
Calcium: 9.5 mg/dL (ref 8.4–10.5)
Chloride: 103 mEq/L (ref 96–112)
Creatinine, Ser: 1.18 mg/dL (ref 0.40–1.50)
GFR: 87.07 mL/min (ref >60.00)
Glucose, Bld: 157 mg/dL — ABNORMAL HIGH (ref 70–99)
Potassium: 4.1 mEq/L (ref 3.5–5.1)
Sodium: 137 mEq/L (ref 135–145)
Total Bilirubin: 0.6 mg/dL (ref 0.2–1.2)
Total Protein: 6.9 g/dL (ref 6.0–8.3)

## 2014-07-26 LAB — LIPID PANEL
Cholesterol: 177 mg/dL (ref 0–200)
HDL: 42.5 mg/dL (ref >39.00)
LDL Cholesterol: 125 mg/dL — ABNORMAL HIGH (ref 0–99)
NonHDL: 134.5
Total CHOL/HDL Ratio: 4
Triglycerides: 46 mg/dL (ref 0.0–149.0)
VLDL: 9.2 mg/dL (ref 0.0–40.0)

## 2014-07-26 LAB — HEMOGLOBIN A1C: Hgb A1c MFr Bld: 8.1 % — ABNORMAL HIGH (ref 4.6–6.5)

## 2014-07-29 ENCOUNTER — Ambulatory Visit (INDEPENDENT_AMBULATORY_CARE_PROVIDER_SITE_OTHER): Payer: Medicare Other | Admitting: Endocrinology

## 2014-07-29 ENCOUNTER — Encounter: Payer: Self-pay | Admitting: Endocrinology

## 2014-07-29 VITALS — BP 143/72 | HR 86 | Temp 97.9°F | Resp 14 | Ht 73.0 in | Wt 216.2 lb

## 2014-07-29 DIAGNOSIS — IMO0002 Reserved for concepts with insufficient information to code with codable children: Secondary | ICD-10-CM

## 2014-07-29 DIAGNOSIS — E038 Other specified hypothyroidism: Secondary | ICD-10-CM

## 2014-07-29 DIAGNOSIS — I10 Essential (primary) hypertension: Secondary | ICD-10-CM | POA: Diagnosis not present

## 2014-07-29 DIAGNOSIS — E1065 Type 1 diabetes mellitus with hyperglycemia: Secondary | ICD-10-CM

## 2014-07-29 DIAGNOSIS — E7800 Pure hypercholesterolemia, unspecified: Secondary | ICD-10-CM

## 2014-07-29 DIAGNOSIS — E063 Autoimmune thyroiditis: Secondary | ICD-10-CM

## 2014-07-29 DIAGNOSIS — N319 Neuromuscular dysfunction of bladder, unspecified: Secondary | ICD-10-CM

## 2014-07-29 NOTE — Progress Notes (Signed)
Subjective:     Patient ID: Johnathan Ross, male   DOB: 04/15/1971, 44 y.o.   MRN: [id number]  Diabetes   Diagnosis: Type 1 diabetes mellitus, date of diagnosis: 1995.   Insulin Pump followup:   CURRENT brand:  Medtronic [id number]  HISTORY: An insulin pump has been in use Since 03/13/11.   PUMP settings are basal rate:  1.5 until 4 a.m; 4 AM = 1.4. 6 a.m.= 1.55. 11 a.m. = 0.9. 12 noon = 1.1, 2 p.m. = 1.2. 4 p.m. = 1 2.0 and 6 p.m. = 1.6  Carbohydrate to insulin ratio: 1:12, at 11 a.m. = 1: 11, after 6 p.m. = 1:7, hyperglycemia correction factor: 1:18,and after 10 p.m. 1:30, target 120, active insulin time 4 hours.  Bolus data: Has 6.3 boluses per day, 71 % with correction and no overriding Changing infusion set every 3-4 days  Recent history: He did  increase his basal rates as directed on his last visit with her basal rates early morning and late afternoon and evening However his A1c is slightly higher at 8.1%  GLUCOSE data:   PRE-MEAL Breakfast Lunch Dinner Bedtime Overall  Glucose range:  67-275   84-231   128-244   45-304    Mean/median:  169     173   176+/-68      HYPERGLYCEMIA:   Blood sugars are variably high throughout the day but more consistently high around 4-6 PM and frequently at 10-11 AM and 12-1 PM with the overall highest average about 245 in the afternoon.  Blood sugars were high fasting until last weekend and they are better now  Does have a few good readings in the early afternoon and sporadically better in the late evening also but his average blood sugar is usually at least 150-160 most of the time HYPOGLYCEMIA appears to be infrequent and has a couple of low sugars last month either after  evening boluses and also once had low sugars in the 60s during the night Treatment of hypoglycemia: He is usually eating a half oatmeal cookie   Other problems identified:  Does not use dual wave bolus for eating higher fat meals like pizza including last night  when his blood sugar went up to 357  May not always get enough boluses to cover his meals at suppertime but overall his postprandial readings after supper are quite variable  Blood sugars probably tend to get higher after breakfast and sometimes after lunch with current carbohydrate ratio 1: 12 in the morning and 1:11 at lunch  EXERCISE: generally active during the afternoon, no programmed activity  Wt Readings from Last 3 Encounters:  07/29/14 216 lb 3.2 oz (98.068 kg)  04/14/14 218 lb 12.8 oz (99.247 kg)  01/12/14 212 lb 12.8 oz (96.525 kg)     Lab Results  Component Value Date   HGBA1C 8.1* 07/26/2014   HGBA1C 7.9* 04/09/2014   HGBA1C 7.7* 01/07/2014   Lab Results  Component Value Date   MICROALBUR 4.5* 04/09/2014   LDLCALC 125* 07/26/2014   CREATININE 1.18 07/26/2014        Medication List       This list is accurate as of: 07/29/14  3:20 PM.  Always use your most recent med list.               AMPYRA 10 MG Tb12  Generic drug:  dalfampridine  10 mg.     AUBAGIO 14 MG Tabs  Generic drug:  Teriflunomide  glucose blood test strip  Commonly known as:  BAYER CONTOUR NEXT TEST  Use as instructed to check blood sugars 8 times per day dx code E10.65     HUMALOG 100 UNIT/ML injection  Generic drug:  insulin lispro  INJECT 80 UNITS WITH PUMP PER DAY     levothyroxine 75 MCG tablet  Commonly known as:  SYNTHROID  Take one tablet every morning on an empty stomach     lisinopril 10 MG tablet  Commonly known as:  PRINIVIL,ZESTRIL  Take 1 tablet (10 mg total) by mouth daily.     simvastatin 20 MG tablet  Commonly known as:  ZOCOR  Take 1 tablet (20 mg total) by mouth at bedtime.        Allergies: No Known Allergies  Past Medical History  Diagnosis Date  . Hyperlipidemia   . Hypertension   . Hypothyroidism   . 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 Multiple sclerosis   . Type 1 diabetes mellitus     No past surgical history on  file.  Family History  Problem Relation Age of Onset  . Diabetes Sister   . Colon cancer Maternal Grandmother   . Colon polyps Maternal Uncle     Social History:  reports that he has never smoked. He has never used smokeless tobacco. He reports that he does not drink alcohol or use illicit drugs.    Review of Systems  He is asking about frequency and urgency of urination during the daytime but only has urination once at night.  This has been going on for about a month    HYPERCHOLESTEROLEMIA: He has been on and off pravastatin and not clear if this has been causing any abnormalities of his liver functions.   His lipids are not well-controlled and he is not sure if he is taking his simvastatin now   Lab Results  Component Value Date   CHOL 177 07/26/2014   HDL 42.50 07/26/2014   LDLCALC 125* 07/26/2014   LDLDIRECT 139.7 04/09/2014   TRIG 46.0 07/26/2014   CHOLHDL 4 07/26/2014    HYPERTENSION:  has  been restarted on lisinopril although blood pressure is still high normal today  NEUROPATHY: Present but has no symptoms except some tingling in his upper legs  He has MULTIPLE sclerosis, on treatment and regular followup, will be getting a new regimen of treatment because of worsening MRI picture  HYPOTHYROIDISM: Adequately controlled with 75 g   Lab Results  Component Value Date   TSH 1.74 04/09/2014   TSH 0.96 06/12/2013   TSH 1.28 03/03/2013       Objective:   Physical Exam  BP 143/72 mmHg  Pulse 86  Temp(Src) 97.9 F (36.6 C)  Resp 14  Ht 6\' 1"  (1.854 m)  Wt 216 lb 3.2 oz (98.068 kg)  BMI 28.53 kg/m2  SpO2 99%       Assessment:      DIABETES control: His blood sugar control is again difficult and has variable blood sugar patterns throughout the day A1c is slightly higher than before at 8.1% See history of present illness for analysis of his current blood sugar patterns and problems  He has mostly higher readings in the late afternoon but  sporadically has high readings at all times  Probably has high post any readings after breakfast and lunch from current data.  Blood sugars after evening meal are variable because of inconsistent coverage of higher fat meals which he is not able to estimate  the bolus for accurately and also not using all wave bolus for foods like pizza  Currently not getting hypoglycemic even though he is moderately active   HYPERCHOLESTEROLEMIA:   He is not sure if he is taking the simvastatin that was prescribed and he will call back tomorrow to confirm His LDL is still relatively high at 125, previously 139  HYPERTENSION: His blood pressure is now slightly better with starting lisinopril    HYPOTHYROIDISM: TSH normal previously and will need follow-up on the next visit   Plan:      Increase basal rate at 2 PM to 1.35 for now May need to increase evening basal rates also if consistently high Carbohydrate coverage 1:10 for breakfast and lunch Cover higher fat meals with extended bolus and extra insulin May reduce basal rate with using temporary basal setting if very active Call if getting excessive hypoglycemia  Will confirm what he is taking for his blood pressure and cholesterol and adjust medications as needed  Bladder dysfunction: He will see a urologist and also check with his neurologist about whether symptoms are related to MS.  UA showed him that he probably does not have a prostate enlargement at his age  Follow-up in 3 months  Counseling time over 50% of today's 25 minute visit  [place]:  Lab on 07/26/2014  Component Date Value Ref Range Status  . Hgb A1c MFr Bld 07/26/2014 8.1* 4.6 - 6.5 % Final   Glycemic Control Guidelines for People with Diabetes:Non Diabetic:  <6%Goal of Therapy: <7%Additional Action Suggested:  >8%   . Sodium 07/26/2014 137  135 - 145 mEq/L Final  . Potassium 07/26/2014 4.1  3.5 - 5.1 mEq/L Final  . Chloride 07/26/2014 103  96 - 112 mEq/L Final   . CO2 07/26/2014 30  19 - 32 mEq/L Final  . Glucose, Bld 07/26/2014 157* 70 - 99 mg/dL Final  . BUN 07/26/2014 15  6 - 23 mg/dL Final  . Creatinine, Ser 07/26/2014 1.18  0.40 - 1.50 mg/dL Final  . Total Bilirubin 07/26/2014 0.6  0.2 - 1.2 mg/dL Final  . Alkaline Phosphatase 07/26/2014 87  39 - 117 U/L Final  . AST 07/26/2014 27  0 - 37 U/L Final  . ALT 07/26/2014 25  0 - 53 U/L Final  . Total Protein 07/26/2014 6.9  6.0 - 8.3 g/dL Final  . Albumin 07/26/2014 4.2  3.5 - 5.2 g/dL Final  . Calcium 07/26/2014 9.5  8.4 - 10.5 mg/dL Final  . GFR 07/26/2014 87.07  >60.00 mL/min Final  . Cholesterol 07/26/2014 177  0 - 200 mg/dL Final   ATP III Classification       Desirable:  < 200 mg/dL               Borderline High:  200 - 239 mg/dL          High:  > = 240 mg/dL  . Triglycerides 07/26/2014 46.0  0.0 - 149.0 mg/dL Final   Normal:  <150 mg/dLBorderline High:  150 - 199 mg/dL  . HDL 07/26/2014 42.50  >39.00 mg/dL Final  . VLDL 07/26/2014 9.2  0.0 - 40.0 mg/dL Final  . LDL Cholesterol 07/26/2014 125* 0 - 99 mg/dL Final  . Total CHOL/HDL Ratio 07/26/2014 4   Final                  Men          Women1/2 Average Risk     3.4  3.3Average Risk          5.0          4.42X Average Risk          9.6          7.13X Average Risk          15.0          11.0                      . NonHDL 07/26/2014 134.50   Final   NOTE:  Non-HDL goal should be 30 mg/dL higher than patient's LDL goal (i.e. LDL goal of < 70 mg/dL, would have non-HDL goal of < 100 mg/dL)

## 2014-07-29 NOTE — Patient Instructions (Signed)
Call med list

## 2014-08-03 ENCOUNTER — Other Ambulatory Visit: Payer: Self-pay | Admitting: *Deleted

## 2014-08-03 MED ORDER — LEVOTHYROXINE SODIUM 75 MCG PO TABS: ORAL_TABLET | ORAL | Status: DC

## 2014-08-17 DIAGNOSIS — D225 Melanocytic nevi of trunk: Secondary | ICD-10-CM | POA: Diagnosis not present

## 2014-08-24 DIAGNOSIS — D72810 Lymphocytopenia: Secondary | ICD-10-CM | POA: Diagnosis not present

## 2014-08-24 DIAGNOSIS — G35 Multiple sclerosis: Secondary | ICD-10-CM | POA: Diagnosis not present

## 2014-08-24 DIAGNOSIS — D729 Disorder of white blood cells, unspecified: Secondary | ICD-10-CM | POA: Diagnosis not present

## 2014-08-24 DIAGNOSIS — Z111 Encounter for screening for respiratory tuberculosis: Secondary | ICD-10-CM | POA: Diagnosis not present

## 2014-08-24 DIAGNOSIS — R5383 Other fatigue: Secondary | ICD-10-CM | POA: Diagnosis not present

## 2014-08-30 DIAGNOSIS — G35 Multiple sclerosis: Secondary | ICD-10-CM | POA: Diagnosis not present

## 2014-08-31 DIAGNOSIS — G35 Multiple sclerosis: Secondary | ICD-10-CM | POA: Diagnosis not present

## 2014-09-01 DIAGNOSIS — G35 Multiple sclerosis: Secondary | ICD-10-CM | POA: Diagnosis not present

## 2014-09-02 DIAGNOSIS — G35 Multiple sclerosis: Secondary | ICD-10-CM | POA: Diagnosis not present

## 2014-09-03 DIAGNOSIS — G35 Multiple sclerosis: Secondary | ICD-10-CM | POA: Diagnosis not present

## 2014-09-07 ENCOUNTER — Telehealth: Payer: Self-pay | Admitting: Endocrinology

## 2014-09-07 ENCOUNTER — Other Ambulatory Visit: Payer: Self-pay

## 2014-09-07 MED ORDER — INSULIN ASPART 100 UNIT/ML ~~LOC~~ SOLN: 80.0000 [IU] | Freq: Once | SUBCUTANEOUS | Status: DC

## 2014-09-07 NOTE — Telephone Encounter (Signed)
Change to NovoLog, same dose

## 2014-09-07 NOTE — Telephone Encounter (Signed)
erx for Novolog done.

## 2014-09-07 NOTE — Telephone Encounter (Signed)
Patient stated that his insurance will not cover the Humalog, but will cover the Novalog, could Dr Dwyane Dee send a prescription for it to [facility]. Please advise

## 2014-10-20 DIAGNOSIS — R350 Frequency of micturition: Secondary | ICD-10-CM | POA: Diagnosis not present

## 2014-10-20 DIAGNOSIS — R351 Nocturia: Secondary | ICD-10-CM | POA: Diagnosis not present

## 2014-10-20 DIAGNOSIS — N319 Neuromuscular dysfunction of bladder, unspecified: Secondary | ICD-10-CM | POA: Diagnosis not present

## 2014-10-27 ENCOUNTER — Other Ambulatory Visit (INDEPENDENT_AMBULATORY_CARE_PROVIDER_SITE_OTHER): Payer: Medicare Other

## 2014-10-27 DIAGNOSIS — E7800 Pure hypercholesterolemia, unspecified: Secondary | ICD-10-CM

## 2014-10-27 DIAGNOSIS — E038 Other specified hypothyroidism: Secondary | ICD-10-CM

## 2014-10-27 DIAGNOSIS — E063 Autoimmune thyroiditis: Secondary | ICD-10-CM

## 2014-10-27 DIAGNOSIS — E1065 Type 1 diabetes mellitus with hyperglycemia: Secondary | ICD-10-CM | POA: Diagnosis not present

## 2014-10-27 DIAGNOSIS — IMO0002 Reserved for concepts with insufficient information to code with codable children: Secondary | ICD-10-CM

## 2014-10-27 LAB — LIPID PANEL
Cholesterol: 146 mg/dL (ref 0–200)
HDL: 47.5 mg/dL (ref >39.00)
LDL Cholesterol: 89 mg/dL (ref 0–99)
NonHDL: 98.5
Total CHOL/HDL Ratio: 3
Triglycerides: 47 mg/dL (ref 0.0–149.0)
VLDL: 9.4 mg/dL (ref 0.0–40.0)

## 2014-10-27 LAB — URINALYSIS, ROUTINE W REFLEX MICROSCOPIC
Bilirubin Urine: NEGATIVE
Hgb urine dipstick: NEGATIVE
Ketones, ur: NEGATIVE
Leukocytes, UA: NEGATIVE
Nitrite: NEGATIVE
RBC / HPF: NONE SEEN (ref 0–?)
Specific Gravity, Urine: 1.01 (ref 1.000–1.030)
Total Protein, Urine: NEGATIVE
Urine Glucose: 250 — AB
Urobilinogen, UA: 0.2 (ref 0.0–1.0)
WBC, UA: NONE SEEN (ref 0–?)
pH: 6 (ref 5.0–8.0)

## 2014-10-27 LAB — MICROALBUMIN / CREATININE URINE RATIO
Creatinine,U: 81.8 mg/dL
Microalb Creat Ratio: 2 mg/g (ref 0.0–30.0)
Microalb, Ur: 1.6 mg/dL (ref 0.0–1.9)

## 2014-10-27 LAB — COMPREHENSIVE METABOLIC PANEL
ALT: 28 U/L (ref 0–53)
AST: 27 U/L (ref 0–37)
Albumin: 4.3 g/dL (ref 3.5–5.2)
Alkaline Phosphatase: 88 U/L (ref 39–117)
BUN: 18 mg/dL (ref 6–23)
CO2: 27 mEq/L (ref 19–32)
Calcium: 9.5 mg/dL (ref 8.4–10.5)
Chloride: 100 mEq/L (ref 96–112)
Creatinine, Ser: 1.2 mg/dL (ref 0.40–1.50)
GFR: 85.3 mL/min (ref >60.00)
Glucose, Bld: 208 mg/dL — ABNORMAL HIGH (ref 70–99)
Potassium: 4.1 mEq/L (ref 3.5–5.1)
Sodium: 134 mEq/L — ABNORMAL LOW (ref 135–145)
Total Bilirubin: 0.4 mg/dL (ref 0.2–1.2)
Total Protein: 7.2 g/dL (ref 6.0–8.3)

## 2014-10-27 LAB — T4, FREE: Free T4: 0.79 ng/dL (ref 0.60–1.60)

## 2014-10-27 LAB — TSH: TSH: 3.37 u[IU]/mL (ref 0.35–4.50)

## 2014-10-27 LAB — HEMOGLOBIN A1C: Hgb A1c MFr Bld: 7.1 % — ABNORMAL HIGH (ref 4.6–6.5)

## 2014-10-28 DIAGNOSIS — E10359 Type 1 diabetes mellitus with proliferative diabetic retinopathy without macular edema: Secondary | ICD-10-CM | POA: Diagnosis not present

## 2014-10-28 DIAGNOSIS — H35373 Puckering of macula, bilateral: Secondary | ICD-10-CM | POA: Diagnosis not present

## 2014-10-28 LAB — HM DIABETES EYE EXAM

## 2014-11-01 ENCOUNTER — Ambulatory Visit (INDEPENDENT_AMBULATORY_CARE_PROVIDER_SITE_OTHER): Payer: Medicare Other | Admitting: Endocrinology

## 2014-11-01 ENCOUNTER — Encounter: Payer: Self-pay | Admitting: Endocrinology

## 2014-11-01 VITALS — BP 138/82 | HR 87 | Temp 97.7°F | Resp 16 | Ht 73.0 in | Wt 209.2 lb

## 2014-11-01 DIAGNOSIS — I10 Essential (primary) hypertension: Secondary | ICD-10-CM

## 2014-11-01 DIAGNOSIS — E038 Other specified hypothyroidism: Secondary | ICD-10-CM | POA: Diagnosis not present

## 2014-11-01 DIAGNOSIS — E1065 Type 1 diabetes mellitus with hyperglycemia: Secondary | ICD-10-CM | POA: Diagnosis not present

## 2014-11-01 DIAGNOSIS — E7800 Pure hypercholesterolemia, unspecified: Secondary | ICD-10-CM

## 2014-11-01 DIAGNOSIS — E063 Autoimmune thyroiditis: Secondary | ICD-10-CM

## 2014-11-01 DIAGNOSIS — IMO0002 Reserved for concepts with insufficient information to code with codable children: Secondary | ICD-10-CM

## 2014-11-01 NOTE — Progress Notes (Signed)
Subjective:     Patient ID: Johnathan Ross, male   DOB: 1970/10/18, 44 y.o.   MRN: [id number]  Diabetes   Diagnosis: Type 1 diabetes mellitus, date of diagnosis: 1995.   Insulin Pump followup:   CURRENT brand:  Medtronic 723 REVEL  HISTORY: An insulin pump has been in use Since 03/13/11.   PUMP settings are basal rate:  1.5 until 4 a.m; 4 AM = 1.4. 6 a.m.= 1.55. 11 a.m. = 0.9. 12 noon = 1.1, 2 p.m. = 1.2. 4 p.m. = 1 2.0 and 6 p.m. = 1.6  Carbohydrate to insulin ratio: 1:12, at 11 a.m. = 1: 11, after 6 p.m. = 1:7, hyperglycemia correction factor: 1:18,and after 10 p.m. 1:30, target 120, active insulin time 4 hours.  Bolus data: Has 6.3 boluses per day, 71 % with correction and no overriding Changing infusion set every 3-4 days  Recent history: He had a higher basal rate set up for the afternoon at 2 PM on the last visit because of overall high readings at that time and he has reprogrammed his pump accordingly  GLUCOSE data from pump download:   Checking blood sugars about 6 times a day  PRE-MEAL Fasting Lunch Dinner Bedtime Overall  Glucose range: 59-318  159-292   131-287   77-200    Mean/median:  154    180   165  176   POST-MEAL PC Breakfast PC Lunch PC Dinner  Glucose range:   131-327    Mean/median:    186     HYPERGLYCEMIA:   Blood sugars are recently mostly high in the afternoons starting from about 2 PM and mostly high until about 7 PM and averaging nearly 200 or more  He has variable FASTING blood sugars with occasional high readings recently  Mealtime coverage: This is hard to assess at breakfast and lunch since he is often not checking postprandial readings but may be getting higher readings after lunch  His blood sugars are usually high before supper  He has some high readings late at night including one overnight after eating fried food.  He still does not understand the concept of doing extra insulin and extending the bolus for high fat  meals   HYPOGLYCEMIA appears to be infrequent and was tending to have lower readings until last week including some hypoglycemia.  Only once had hypoglycemia at about 2 AM  Treatment of hypoglycemia: He is usually eating a half oatmeal cookie or drinking some juice  EXERCISE: generally active during the afternoon, no programmed activity although probably less so recently  Wt Readings from Last 3 Encounters:  11/01/14 209 lb 3.2 oz (94.892 kg)  07/29/14 216 lb 3.2 oz (98.068 kg)  04/14/14 218 lb 12.8 oz (99.247 kg)     Lab Results  Component Value Date   HGBA1C 7.1* 10/27/2014   HGBA1C 8.1* 07/26/2014   HGBA1C 7.9* 04/09/2014   Lab Results  Component Value Date   MICROALBUR 1.6 10/27/2014   LDLCALC 89 10/27/2014   CREATININE 1.20 10/27/2014        Medication List       This list is accurate as of: 11/01/14  9:11 AM.  Always use your most recent med list.               acyclovir 400 MG tablet  Commonly known as:  ZOVIRAX     AMPYRA 10 MG Tb12  Generic drug:  dalfampridine  10 mg.     AUBAGIO 14 MG  Tabs  Generic drug:  Teriflunomide     glucose blood test strip  Commonly known as:  BAYER CONTOUR NEXT TEST  Use as instructed to check blood sugars 8 times per day dx code E10.65     insulin aspart 100 UNIT/ML injection  Commonly known as:  novoLOG  Inject 80 Units into the skin once.     levothyroxine 75 MCG tablet  Commonly known as:  SYNTHROID  Take one tablet every morning on an empty stomach     lisinopril 10 MG tablet  Commonly known as:  PRINIVIL,ZESTRIL  Take 1 tablet (10 mg total) by mouth daily.     simvastatin 20 MG tablet  Commonly known as:  ZOCOR  Take 1 tablet (20 mg total) by mouth at bedtime.        Allergies: No Known Allergies  Past Medical History  Diagnosis Date  . Hyperlipidemia   . Hypertension   . Hypothyroidism   . 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 Multiple sclerosis   . Type 1 diabetes mellitus     No  past surgical history on file.  Family History  Problem Relation Age of Onset  . Diabetes Sister   . Colon cancer Maternal Grandmother   . Colon polyps Maternal Uncle     Social History:  reports that he has never smoked. He has never used smokeless tobacco. He reports that he does not drink alcohol or use illicit drugs.    Review of Systems  He had recent eye exam and is apparently stable for his retinopathy     HYPERCHOLESTEROLEMIA: He has been on and off pravastatin and now is taking simvastatin with good control Liver functions are normal   Lab Results  Component Value Date   CHOL 146 10/27/2014   HDL 47.50 10/27/2014   LDLCALC 89 10/27/2014   LDLDIRECT 139.7 04/09/2014   TRIG 47.0 10/27/2014   CHOLHDL 3 10/27/2014    HYPERTENSION:  has  been on lisinopril   NEUROPATHY: Present but has no symptoms except some tingling in his upper legs  He has MULTIPLE sclerosis, on a new treatment and regular followup, still has some weakness in his legs  HYPOTHYROIDISM: Adequately controlled with 75 g  Lab Results  Component Value Date   TSH 3.37 10/27/2014   TSH 1.74 04/09/2014   TSH 0.96 06/12/2013   FREET4 0.79 10/27/2014   He has lost weight and he is not clear why.     Objective:   Physical Exam  BP 138/82 mmHg  Pulse 87  Temp(Src) 97.7 F (36.5 C)  Resp 16  Ht 6\' 1"  (1.854 m)  Wt 209 lb 3.2 oz (94.892 kg)  BMI 27.61 kg/m2  SpO2 98%       Assessment:      DIABETES control: His blood sugar control is overall better with A1c significantly improved at 7.1 However more recently his control has been again difficult and has generally higher readings See history of present illness for analysis of his current blood sugar patterns and problems  He has mostly higher readings in the late afternoon between about 2 PM-7 PM  Probably has high readings after high fat meals and is not using dual wave bolus.  As discussed above he is not understanding the concept or  implementing it  Has occasional hypoglycemia waking up   HYPERCHOLESTEROLEMIA:   His LDL is well controlled with simvastatin and will continue this  HYPERTENSION: His blood pressure is controlled with lisinopril  HYPOTHYROIDISM: TSH normal  and will need follow-up in about 6 months again   Plan:      Increase basal rate at 12 noon to 1.2, 2 PM to 1.45 and at 4 PM to 2.1 Carbohydrate coverage 1:8 for lunch and no change for other meals Uses extended boluses were higher fat meals with extra insulin for high fat content as discussed today Discussed day-to-day management of insulin pump, bolusing, adjusting basal rate for activity and more postprandial monitoring Call if getting excessive hypoglycemia are having persistently high readings in the afternoons  Follow-up in 3 months  Counseling time on subjects discussed above is over 50% of today's 25 minute visit   Jamin Humphries

## 2014-11-22 DIAGNOSIS — Z5181 Encounter for therapeutic drug level monitoring: Secondary | ICD-10-CM | POA: Diagnosis not present

## 2014-11-22 DIAGNOSIS — G35 Multiple sclerosis: Secondary | ICD-10-CM | POA: Diagnosis not present

## 2014-11-22 DIAGNOSIS — M6281 Muscle weakness (generalized): Secondary | ICD-10-CM | POA: Diagnosis not present

## 2014-11-22 DIAGNOSIS — Z79899 Other long term (current) drug therapy: Secondary | ICD-10-CM | POA: Diagnosis not present

## 2014-11-22 DIAGNOSIS — R5383 Other fatigue: Secondary | ICD-10-CM | POA: Diagnosis not present

## 2014-11-22 DIAGNOSIS — R269 Unspecified abnormalities of gait and mobility: Secondary | ICD-10-CM | POA: Diagnosis not present

## 2014-12-19 ENCOUNTER — Other Ambulatory Visit: Payer: Self-pay | Admitting: Endocrinology

## 2015-01-21 ENCOUNTER — Other Ambulatory Visit: Payer: Self-pay | Admitting: Endocrinology

## 2015-01-27 ENCOUNTER — Other Ambulatory Visit (INDEPENDENT_AMBULATORY_CARE_PROVIDER_SITE_OTHER): Payer: Medicare Other

## 2015-01-27 DIAGNOSIS — IMO0002 Reserved for concepts with insufficient information to code with codable children: Secondary | ICD-10-CM

## 2015-01-27 DIAGNOSIS — E1065 Type 1 diabetes mellitus with hyperglycemia: Secondary | ICD-10-CM

## 2015-01-27 DIAGNOSIS — E038 Other specified hypothyroidism: Secondary | ICD-10-CM

## 2015-01-27 DIAGNOSIS — E063 Autoimmune thyroiditis: Secondary | ICD-10-CM

## 2015-01-27 LAB — TSH: TSH: 2.1 u[IU]/mL (ref 0.35–4.50)

## 2015-01-27 LAB — COMPREHENSIVE METABOLIC PANEL
ALT: 28 U/L (ref 0–53)
AST: 28 U/L (ref 0–37)
Albumin: 4.4 g/dL (ref 3.5–5.2)
Alkaline Phosphatase: 99 U/L (ref 39–117)
BUN: 11 mg/dL (ref 6–23)
CO2: 29 mEq/L (ref 19–32)
Calcium: 9.5 mg/dL (ref 8.4–10.5)
Chloride: 103 mEq/L (ref 96–112)
Creatinine, Ser: 1.15 mg/dL (ref 0.40–1.50)
GFR: 89.48 mL/min (ref >60.00)
Glucose, Bld: 166 mg/dL — ABNORMAL HIGH (ref 70–99)
Potassium: 4.3 mEq/L (ref 3.5–5.1)
Sodium: 139 mEq/L (ref 135–145)
Total Bilirubin: 0.4 mg/dL (ref 0.2–1.2)
Total Protein: 7.2 g/dL (ref 6.0–8.3)

## 2015-01-27 LAB — HEMOGLOBIN A1C: Hgb A1c MFr Bld: 7.5 % — ABNORMAL HIGH (ref 4.6–6.5)

## 2015-02-01 ENCOUNTER — Ambulatory Visit (INDEPENDENT_AMBULATORY_CARE_PROVIDER_SITE_OTHER): Payer: Medicare Other | Admitting: Endocrinology

## 2015-02-01 ENCOUNTER — Encounter: Payer: Self-pay | Admitting: Endocrinology

## 2015-02-01 VITALS — BP 128/84 | HR 88 | Temp 99.0°F | Resp 16 | Ht 73.0 in | Wt 208.6 lb

## 2015-02-01 DIAGNOSIS — E038 Other specified hypothyroidism: Secondary | ICD-10-CM

## 2015-02-01 DIAGNOSIS — E1065 Type 1 diabetes mellitus with hyperglycemia: Secondary | ICD-10-CM | POA: Diagnosis not present

## 2015-02-01 DIAGNOSIS — IMO0002 Reserved for concepts with insufficient information to code with codable children: Secondary | ICD-10-CM

## 2015-02-01 DIAGNOSIS — I10 Essential (primary) hypertension: Secondary | ICD-10-CM

## 2015-02-01 DIAGNOSIS — E063 Autoimmune thyroiditis: Secondary | ICD-10-CM

## 2015-02-01 DIAGNOSIS — Z23 Encounter for immunization: Secondary | ICD-10-CM

## 2015-02-01 NOTE — Progress Notes (Signed)
Subjective:     Patient ID: Johnathan Ross, male   DOB: Jun 30, 1970, 44 y.o.   MRN: [id number]  Diabetes   Diagnosis: Type 1 diabetes mellitus, date of diagnosis: 1995.   Insulin Pump followup:   CURRENT brand:  Medtronic 723 REVEL  HISTORY: An insulin pump has been in use since 03/13/11.   PUMP settings are basal rate:  1.5 until 4 a.m; 4 AM = 1.35. 6 a.m.= 1.55. 11 a.m. = 0.9. 12 noon = 1.2, 2 p.m. = 1. 45. 4 p.m. =  2.1 and 6 p.m. = 1.6  Carbohydrate to insulin ratio: 1: 10, at 11 a.m. = 1: 10, after 6 p.m. = 1:7, hyperglycemia correction factor: 1: 25,and after 10 p.m. 1:30, target 120, active insulin time 4 hours.  Changing infusion set every 4 days  Recent history: He had a better A1c of 7.1% previously and not clear why it is slightly higher at 7.5 Overall blood sugars seem to show the similar pattern on each visit He was shown how to do a dual wave bolus on the last visit and appears to have done this once with good results  GLUCOSE data from pump download:   Mean values apply above for all meters except median for One Touch  PRE-MEAL Fasting Lunch Dinner Bedtime Overall  Glucose range:  58-252   95-227   90-267   45-230    Mean/median:  135   139   162   143   140     HYPERGLYCEMIA:   Blood sugars are recently mostly high in the late afternoons starting from about 4 PM and mostly high until about 7 PM and averaging nearly 200 or more; does not clearly have any higher readings after his lunch bolus; these boluses at variable times  He sporadically has high readings late at night and also but not consistently after his evening meal  HYPOGLYCEMIA appears to be infrequent and has occurred mostly between 9 AM-12 noon, sometimes even before his breakfast Has only one low reading at bedtime He thinks he can detect hypoglycemia symptoms fairly regularly Treatment of hypoglycemia: He is usually eating a half oatmeal cookie or drinking some juice  Other blood sugar  patterns:  He has somewhat variable FASTING blood sugars but when an average has fairly good readings  No overnight hypoglycemia  Eyes lowest blood sugars are late morning and these occur fairly consistently with or without boluses  He may tend to have low normal readings at bedtime at times with occasional high readings recently  Mealtime coverage: This is not easily assessed because of variable bolus times and not enough postprandial readings but does not clearly have high readings after lunch or dinner consistently.  Blood sugars may be relatively lower after breakfast  Does not think he has tendency to hypoglycemia when he is more active in the early afternoon hard to assess at breakfast and lunch since he  Bolus data: Has 4.8 boluses per day, 55 % with correction and 75% with food  EXERCISE: generally active during the afternoon, no programmed activity   Wt Readings from Last 3 Encounters:  02/01/15 208 lb 9.6 oz (94.62 kg)  11/01/14 209 lb 3.2 oz (94.892 kg)  07/29/14 216 lb 3.2 oz (98.068 kg)     Lab Results  Component Value Date   HGBA1C 7.5* 01/27/2015   HGBA1C 7.1* 10/27/2014   HGBA1C 8.1* 07/26/2014   Lab Results  Component Value Date   MICROALBUR 1.6 10/27/2014  LDLCALC 89 10/27/2014   CREATININE 1.15 01/27/2015        Medication List       This list is accurate as of: 02/01/15  8:52 PM.  Always use your most recent med list.               acyclovir 400 MG tablet  Commonly known as:  ZOVIRAX     AMPYRA 10 MG Tb12  Generic drug:  dalfampridine  10 mg.     AUBAGIO 14 MG Tabs  Generic drug:  Teriflunomide     glucose blood test strip  Commonly known as:  BAYER CONTOUR NEXT TEST  Use as instructed to check blood sugars 8 times per day dx code E10.65     insulin aspart 100 UNIT/ML injection  Commonly known as:  novoLOG  Inject 80 Units into the skin once.     levothyroxine 75 MCG tablet  Commonly known as:  SYNTHROID  Take one tablet every  morning on an empty stomach     lisinopril 10 MG tablet  Commonly known as:  PRINIVIL,ZESTRIL  TAKE 1 TABLET BY MOUTH DAILY     simvastatin 20 MG tablet  Commonly known as:  ZOCOR  TAKE 1 TABLET (20 MG TOTAL) BY MOUTH AT BEDTIME.        Allergies: No Known Allergies  Past Medical History  Diagnosis Date  . Hyperlipidemia   . Hypertension   . Hypothyroidism   . 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 Multiple sclerosis   . Type 1 diabetes mellitus     No past surgical history on file.  Family History  Problem Relation Age of Onset  . Diabetes Sister   . Colon cancer Maternal Grandmother   . Colon polyps Maternal Uncle     Social History:  reports that he has never smoked. He has never used smokeless tobacco. He reports that he does not drink alcohol or use illicit drugs.    Review of Systems  He had his annual eye exam and is apparently stable for his retinopathy     HYPERCHOLESTEROLEMIA: He has been on and off pravastatin and more recently has been taking simvastatin with good control Liver functions are normal consistently   Lab Results  Component Value Date   CHOL 146 10/27/2014   HDL 47.50 10/27/2014   LDLCALC 89 10/27/2014   LDLDIRECT 139.7 04/09/2014   TRIG 47.0 10/27/2014   CHOLHDL 3 10/27/2014    HYPERTENSION:  has  been on lisinopril with fairly good control   NEUROPATHY: Present but has no symptoms recently  He has MULTIPLE sclerosis, clinically has been doing well with his current management  HYPOTHYROIDISM: Adequately controlled with 75 g  Lab Results  Component Value Date   TSH 2.10 01/27/2015   TSH 3.37 10/27/2014   TSH 1.74 04/09/2014   FREET4 0.79 10/27/2014        Objective:   Physical Exam  BP 128/84 mmHg  Pulse 88  Temp(Src) 99 F (37.2 C)  Resp 16  Ht 6\' 1"  (1.854 m)  Wt 208 lb 9.6 oz (94.62 kg)  BMI 27.53 kg/m2  SpO2 97%       Assessment:      DIABETES control: His blood sugar control is overall  fairly good with some fluctuation A1c is slightly higher at 7.5 but still relatively good for his type of diabetes and difficulties with day-to-day management See history of present illness for analysis of his current blood sugar patterns and  problems  He has mostly higher readings in the late afternoon between about 4PM-7 PM; this is not related to any high fat meals usually  Has only sporadic high readings after evening meal; he has tried a dual wave bolus with good results for high fat meal and discussed that he needs to possibly add more insulin when he has significant amount of fat in the diet; he will have to note how he response to various foods  Blood sugars are relatively low midmorning and late at night and will adjust his basal rates accordingly  Still compliant with monitoring blood sugars are currently   HYPERCHOLESTEROLEMIA:  His LDL is well controlled with simvastatin with no alteration in liver functions now  HYPERTENSION: His blood pressure is controlled with lisinopril  HYPOTHYROIDISM: TSH is stable and he will continue the same dose   Plan:      Increase basal rate at 4 PM to 2.25 until 7 PM Reduce basal rate between 9 AM-11 AM down to 0.9 More postprandial readings Call or higher fat meals as above No change in other medications  Follow-up in 3 months  Counseling time on subjects discussed above is over 50% of today's 25 minute visit  Influenza vaccine given  [facility]

## 2015-03-07 DIAGNOSIS — R5383 Other fatigue: Secondary | ICD-10-CM | POA: Diagnosis not present

## 2015-03-07 DIAGNOSIS — R269 Unspecified abnormalities of gait and mobility: Secondary | ICD-10-CM | POA: Diagnosis not present

## 2015-03-07 DIAGNOSIS — Z5181 Encounter for therapeutic drug level monitoring: Secondary | ICD-10-CM | POA: Diagnosis not present

## 2015-03-07 DIAGNOSIS — M6281 Muscle weakness (generalized): Secondary | ICD-10-CM | POA: Diagnosis not present

## 2015-03-07 DIAGNOSIS — Z79899 Other long term (current) drug therapy: Secondary | ICD-10-CM | POA: Diagnosis not present

## 2015-03-07 DIAGNOSIS — G35 Multiple sclerosis: Secondary | ICD-10-CM | POA: Diagnosis not present

## 2015-03-11 ENCOUNTER — Other Ambulatory Visit: Payer: Self-pay | Admitting: Endocrinology

## 2015-03-14 DIAGNOSIS — G35 Multiple sclerosis: Secondary | ICD-10-CM | POA: Diagnosis not present

## 2015-03-14 DIAGNOSIS — S61319A Laceration without foreign body of unspecified finger with damage to nail, initial encounter: Secondary | ICD-10-CM | POA: Diagnosis not present

## 2015-03-16 ENCOUNTER — Other Ambulatory Visit: Payer: Self-pay | Admitting: Endocrinology

## 2015-03-16 ENCOUNTER — Telehealth: Payer: Self-pay | Admitting: Endocrinology

## 2015-03-16 NOTE — Telephone Encounter (Signed)
rx was sent

## 2015-03-16 NOTE — Telephone Encounter (Signed)
Pt needs contour test strips rx called into [facility] on [address]

## 2015-05-03 ENCOUNTER — Other Ambulatory Visit (INDEPENDENT_AMBULATORY_CARE_PROVIDER_SITE_OTHER): Payer: Medicare Other

## 2015-05-03 DIAGNOSIS — E1065 Type 1 diabetes mellitus with hyperglycemia: Secondary | ICD-10-CM

## 2015-05-03 DIAGNOSIS — IMO0002 Reserved for concepts with insufficient information to code with codable children: Secondary | ICD-10-CM

## 2015-05-03 LAB — COMPREHENSIVE METABOLIC PANEL
ALT: 22 U/L (ref 0–53)
AST: 22 U/L (ref 0–37)
Albumin: 4.2 g/dL (ref 3.5–5.2)
Alkaline Phosphatase: 89 U/L (ref 39–117)
BUN: 18 mg/dL (ref 6–23)
CO2: 30 mEq/L (ref 19–32)
Calcium: 9.5 mg/dL (ref 8.4–10.5)
Chloride: 102 mEq/L (ref 96–112)
Creatinine, Ser: 1.21 mg/dL (ref 0.40–1.50)
GFR: 84.28 mL/min (ref >60.00)
Glucose, Bld: 180 mg/dL — ABNORMAL HIGH (ref 70–99)
Potassium: 4.6 mEq/L (ref 3.5–5.1)
Sodium: 138 mEq/L (ref 135–145)
Total Bilirubin: 0.7 mg/dL (ref 0.2–1.2)
Total Protein: 7 g/dL (ref 6.0–8.3)

## 2015-05-03 LAB — HEMOGLOBIN A1C: Hgb A1c MFr Bld: 7.5 % — ABNORMAL HIGH (ref 4.6–6.5)

## 2015-05-05 ENCOUNTER — Other Ambulatory Visit: Payer: Self-pay | Admitting: Endocrinology

## 2015-05-06 ENCOUNTER — Ambulatory Visit: Payer: Medicare Other | Admitting: Endocrinology

## 2015-05-09 ENCOUNTER — Ambulatory Visit (INDEPENDENT_AMBULATORY_CARE_PROVIDER_SITE_OTHER): Payer: Medicare Other | Admitting: Endocrinology

## 2015-05-09 ENCOUNTER — Encounter: Payer: Self-pay | Admitting: Endocrinology

## 2015-05-09 VITALS — BP 142/78 | HR 91 | Temp 98.3°F | Resp 14 | Ht 73.0 in | Wt 212.8 lb

## 2015-05-09 DIAGNOSIS — I10 Essential (primary) hypertension: Secondary | ICD-10-CM | POA: Diagnosis not present

## 2015-05-09 DIAGNOSIS — E038 Other specified hypothyroidism: Secondary | ICD-10-CM

## 2015-05-09 DIAGNOSIS — E1065 Type 1 diabetes mellitus with hyperglycemia: Secondary | ICD-10-CM | POA: Diagnosis not present

## 2015-05-09 DIAGNOSIS — E063 Autoimmune thyroiditis: Secondary | ICD-10-CM

## 2015-05-09 NOTE — Progress Notes (Signed)
Subjective:     Patient ID: Johnathan Ross, male   DOB: [birth year], 44 y.o.   MRN: [id number]  Diabetes   Diagnosis: Type 1 diabetes mellitus, date of diagnosis: 1995.   Insulin Pump followup:   CURRENT brand:  Medtronic 723 REVEL  HISTORY: An insulin pump has been in use since 03/13/11.   PUMP settings are basal rate:  1.5 until 4 a.m; 4 AM = 1.35. 6 a.m.= 1.55. 11 a.m. = 0.9. 12 noon = 1.2, 2 p.m. = 1. 45. 4 p.m. =  2.1 and 6 p.m. = 1.6  Carbohydrate to insulin ratio: 1: 10, at 11 a.m. = 1: 10, after 6 p.m. = 1:7, hyperglycemia correction factor: 1: 25,and after 10 p.m. 1:30, target 120, active insulin time 4 hours.  Changing infusion set every 4 days  Recent history: On his last visit he was supposed to increase his basal rate at 4 PM but he forgot to do this Also did not reduce his mid morning basal rate as directed He had a better A1c of 7.1% previously and now it is relatively higher at 7.5 again  GLUCOSE data from pump download, checking 4.4 times a day recently:   Mean values apply above for all meters except median for One Touch  PRE-MEAL Fasting Lunch Dinner Bedtime Overall  Glucose range:  60-265   93-215   89-360   68-305    Mean/median:  137   150   204   179   179+/-84     Current blood sugar patterns, management and problems identified:  He has variable FASTING blood sugars and relatively higher when he checks them earlier in the morning  Blood sugars tend to be low normal midmorning and before lunch at times  His blood sugars are fairly consistently higher in the late afternoon and early evening except a couple of occasions  Usually has blood sugars are improving by bedtime with his current suppertime boluses but not consistently normal around midnight when he checks them.  Overnight blood sugars are quite variable with occasional readings in the 60s, not clear why he had lower readings overnight on some days  He is not very active recently and probably  needing a little more insulin  He does occasionally have higher fat meals and will take extra 2 units with the three-hour bolus but he still will have readings over 200 in the first couple of hours, no readings available recently but yesterday when he had pizza he forgot to bolus and glucose was 400+  HYPOGLYCEMIA appears to be infrequent and has occurred mostly between 9 AM-12 noon, sometimes even before his breakfast Has only one low reading at bedtime He thinks he can detect hypoglycemia symptoms fairly regularly Treatment of hypoglycemia: He is usually eating a half oatmeal cookie or drinking some juice  Bolus data: Has 5.4 boluses per day, 55 % with correction and 68 % with food  EXERCISE: no programmed activity   Wt Readings from Last 3 Encounters:  05/09/15 212 lb 12.8 oz (96.525 kg)  02/01/15 208 lb 9.6 oz (94.62 kg)  11/01/14 209 lb 3.2 oz (94.892 kg)     Lab Results  Component Value Date   HGBA1C 7.5* 05/03/2015   HGBA1C 7.5* 01/27/2015   HGBA1C 7.1* 10/27/2014   Lab Results  Component Value Date   MICROALBUR 1.6 10/27/2014   LDLCALC 89 10/27/2014   CREATININE 1.21 05/03/2015        Medication List  This list is accurate as of: 05/09/15 12:52 PM.  Always use your most recent med list.               acyclovir 400 MG tablet  Commonly known as:  ZOVIRAX     AMPYRA 10 MG Tb12  Generic drug:  dalfampridine  10 mg.     AUBAGIO 14 MG Tabs  Generic drug:  Teriflunomide     BAYER CONTOUR NEXT TEST test strip  Generic drug:  glucose blood  USE AS INSTRUCTED TO CHECK BLOOD SUGARS 8 TIMES PER DAY DX CODE 250.03     insulin aspart 100 UNIT/ML injection  Commonly known as:  novoLOG  Inject 80 Units into the skin once.     levothyroxine 75 MCG tablet  Commonly known as:  SYNTHROID, LEVOTHROID  TAKE ONE TABLET EVERY MORNING ON AN EMPTY STOMACH     lisinopril 10 MG tablet  Commonly known as:  PRINIVIL,ZESTRIL  TAKE 1 TABLET BY MOUTH DAILY      simvastatin 20 MG tablet  Commonly known as:  ZOCOR  TAKE 1 TABLET (20 MG TOTAL) BY MOUTH AT BEDTIME.        Allergies: No Known Allergies  Past Medical History  Diagnosis Date  . Hyperlipidemia   . Hypertension   . Hypothyroidism   . 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 Multiple sclerosis ([place])   . Type 1 diabetes mellitus ([facility])     No past surgical history on file.  Family History  Problem Relation Age of Onset  . Diabetes Sister   . Colon cancer Maternal Grandmother   . Colon polyps Maternal Uncle     Social History:  reports that he has never smoked. He has never used smokeless tobacco. He reports that he does not drink alcohol or use illicit drugs.    Review of Systems  He had his annual eye exam and is apparently stable for his retinopathy     HYPERCHOLESTEROLEMIA: He has been on and off pravastatin and more recently has been taking simvastatin 20 mg with good control Liver functions are normal now   Lab Results  Component Value Date   CHOL 146 10/27/2014   HDL 47.50 10/27/2014   LDLCALC 89 10/27/2014   LDLDIRECT 139.7 04/09/2014   TRIG 47.0 10/27/2014   CHOLHDL 3 10/27/2014   Lab Results  Component Value Date   ALT 22 05/03/2015    HYPERTENSION:  has  been on lisinopril with fairly good control, blood pressure high normal today   NEUROPATHY: Present but has no symptoms   He has MULTIPLE sclerosis, clinically has been stable with still continues to have some weakness  HYPOTHYROIDISM: Adequately controlled with 75 g  Lab Results  Component Value Date   TSH 2.10 01/27/2015   TSH 3.37 10/27/2014   TSH 1.74 04/09/2014   FREET4 0.79 10/27/2014        Objective:   Physical Exam  BP 146/84 mmHg  Pulse 91  Temp(Src) 98.3 F (36.8 C)  Resp 14  Ht 6\' 1"  (1.854 m)  Wt 212 lb 12.8 oz (96.525 kg)  BMI 28.08 kg/m2  SpO2 99%       Assessment:      DIABETES control: His blood sugar control is overall fairly good with some  fluctuation, A1c 7.5  See history of present illness for analysis of his current blood sugar patterns and problems  He is subjectively doing fairly well with diabetes management and usually compliant with instructions for  monitoring, bolusing and adjusting boluses for higher fat meals However he does have continued fluctuation in his blood sugars at all times that are somewhat unpredictable   HYPERGLYCEMIA occurs mostly in the late afternoons and he did not increase his basal rate as directed last time Blood sugars are relatively low midmorning, not always related to a bolus in the morning Again discussed coverage of higher fat meals and he is usually not getting enough insulin to cover these in addition to his carbohydrate coverage Also discussed day-to-day management with insulin pump   HYPERTENSION: His blood pressure is controlled with lisinopril, will continue to monitor his blood pressure is high normal today   HYPOTHYROIDISM: TSH is Do to be checked on the next visit    Plan:      Increase basal rate at 3 PM to 2.25 Reduce basal rate at 8 AM to 1.40 until 11 AM Call if having been consistently high or low readings Cover high fat meals as discussed above  Follow-up in 3 months  Counseling time on subjects discussed above is over 50% of today's 25 minute visit    Mettie Roylance

## 2015-05-09 NOTE — Patient Instructions (Signed)
11 am basal 1.4 and 3 pm 2.25

## 2015-05-16 ENCOUNTER — Other Ambulatory Visit: Payer: Self-pay | Admitting: *Deleted

## 2015-05-16 MED ORDER — SIMVASTATIN 20 MG PO TABS: ORAL_TABLET | ORAL | Status: DC

## 2015-05-16 MED ORDER — LISINOPRIL 10 MG PO TABS: 10.0000 mg | ORAL_TABLET | Freq: Every day | ORAL | Status: DC

## 2015-06-20 DIAGNOSIS — G35 Multiple sclerosis: Secondary | ICD-10-CM | POA: Diagnosis not present

## 2015-06-20 DIAGNOSIS — M792 Neuralgia and neuritis, unspecified: Secondary | ICD-10-CM | POA: Diagnosis not present

## 2015-06-22 ENCOUNTER — Other Ambulatory Visit: Payer: Self-pay | Admitting: Urology

## 2015-06-22 DIAGNOSIS — G35 Multiple sclerosis: Secondary | ICD-10-CM

## 2015-07-01 ENCOUNTER — Ambulatory Visit
Admission: RE | Admit: 2015-07-01 | Discharge: 2015-07-01 | Disposition: A | Discharge status: Home / Self Care | Payer: Medicare Other | Source: Ambulatory Visit | Attending: Urology | Admitting: Urology

## 2015-07-01 DIAGNOSIS — M50322 Other cervical disc degeneration at C5-C6 level: Secondary | ICD-10-CM | POA: Diagnosis not present

## 2015-07-01 DIAGNOSIS — G35 Multiple sclerosis: Secondary | ICD-10-CM

## 2015-07-01 DIAGNOSIS — M50323 Other cervical disc degeneration at C6-C7 level: Secondary | ICD-10-CM | POA: Diagnosis not present

## 2015-07-01 MED ORDER — GADOBENATE DIMEGLUMINE 529 MG/ML IV SOLN
20.0000 mL | Freq: Once | INTRAVENOUS | Status: AC | PRN
  Administered 2015-07-01: 20 mL via INTRAVENOUS

## 2015-07-23 ENCOUNTER — Other Ambulatory Visit: Payer: Self-pay | Admitting: Endocrinology

## 2015-08-03 ENCOUNTER — Other Ambulatory Visit (INDEPENDENT_AMBULATORY_CARE_PROVIDER_SITE_OTHER): Payer: Medicare Other

## 2015-08-03 DIAGNOSIS — E038 Other specified hypothyroidism: Secondary | ICD-10-CM

## 2015-08-03 DIAGNOSIS — E1065 Type 1 diabetes mellitus with hyperglycemia: Secondary | ICD-10-CM

## 2015-08-03 DIAGNOSIS — E063 Autoimmune thyroiditis: Secondary | ICD-10-CM

## 2015-08-03 LAB — COMPREHENSIVE METABOLIC PANEL
ALT: 27 U/L (ref 0–53)
AST: 20 U/L (ref 0–37)
Albumin: 4.3 g/dL (ref 3.5–5.2)
Alkaline Phosphatase: 97 U/L (ref 39–117)
BUN: 19 mg/dL (ref 6–23)
CO2: 28 mEq/L (ref 19–32)
Calcium: 9.7 mg/dL (ref 8.4–10.5)
Chloride: 100 mEq/L (ref 96–112)
Creatinine, Ser: 1.3 mg/dL (ref 0.40–1.50)
GFR: 77.49 mL/min (ref >60.00)
Glucose, Bld: 171 mg/dL — ABNORMAL HIGH (ref 70–99)
Potassium: 4.4 mEq/L (ref 3.5–5.1)
Sodium: 136 mEq/L (ref 135–145)
Total Bilirubin: 0.7 mg/dL (ref 0.2–1.2)
Total Protein: 6.9 g/dL (ref 6.0–8.3)

## 2015-08-03 LAB — LIPID PANEL
Cholesterol: 157 mg/dL (ref 0–200)
HDL: 45.7 mg/dL (ref >39.00)
LDL Cholesterol: 97 mg/dL (ref 0–99)
NonHDL: 111.13
Total CHOL/HDL Ratio: 3
Triglycerides: 73 mg/dL (ref 0.0–149.0)
VLDL: 14.6 mg/dL (ref 0.0–40.0)

## 2015-08-03 LAB — TSH: TSH: 3.28 u[IU]/mL (ref 0.35–4.50)

## 2015-08-03 LAB — T4, FREE: Free T4: 0.9 ng/dL (ref 0.60–1.60)

## 2015-08-03 LAB — HEMOGLOBIN A1C: Hgb A1c MFr Bld: 8 % — ABNORMAL HIGH (ref 4.6–6.5)

## 2015-08-08 ENCOUNTER — Ambulatory Visit (INDEPENDENT_AMBULATORY_CARE_PROVIDER_SITE_OTHER): Payer: Medicare Other | Admitting: Endocrinology

## 2015-08-08 ENCOUNTER — Encounter: Payer: Self-pay | Admitting: Endocrinology

## 2015-08-08 VITALS — BP 132/82 | HR 94 | Temp 98.2°F | Resp 14 | Ht 73.0 in | Wt 216.0 lb

## 2015-08-08 DIAGNOSIS — E038 Other specified hypothyroidism: Secondary | ICD-10-CM | POA: Diagnosis not present

## 2015-08-08 DIAGNOSIS — E063 Autoimmune thyroiditis: Secondary | ICD-10-CM

## 2015-08-08 DIAGNOSIS — E1065 Type 1 diabetes mellitus with hyperglycemia: Secondary | ICD-10-CM

## 2015-08-08 DIAGNOSIS — I10 Essential (primary) hypertension: Secondary | ICD-10-CM | POA: Diagnosis not present

## 2015-08-08 NOTE — Progress Notes (Signed)
Subjective:     Patient ID: Johnathan Ross, male   DOB: 1970/08/26, 45 y.o.   MRN: [id number]  Diabetes   Diagnosis: Type 1 diabetes mellitus, date of diagnosis: 1995.   Insulin Pump followup:   CURRENT brand:  Medtronic 723 REVEL  HISTORY: An insulin pump has been in use since 03/13/11.   PUMP settings are basal rate:  1.5 until 4 a.m; 4 AM = 1.35. 6 a.m.= 1.55. 9 AM = 1.4,11 a.m. = 0.9. 12 noon = 1.2, 2 p.m. = 1. 45. 3 PM = 2.25 and 6 p.m. = 1.6  Carbohydrate to insulin ratio: 1: 10, at 11 a.m. = 1: 10, after 6 p.m. = 1:7, hyperglycemia correction factor: 1: 25,and after 10 p.m. 1:30, target 120, active insulin time 4 hours.  Changing infusion set every pre-4 days  Recent history: On his last visit hehad a higher basal rate done at 3 PM and reduce basal rate at 9 AM  His blood sugars are getting more difficult to control with A1c getting progressively higher  GLUCOSE data from pump download, checking 6.4 times a day recently:   Mean values apply above for all meters except median for One Touch  PRE-MEAL Fasting Lunch Dinner Bedtime Overall  Glucose range: 83-300+ 69-341 69-321 120-267   Mean/median: 166 181 167 172 178+/-76    Current blood sugar patterns, management and problems identified:  He has variable FASTING blood sugars but they are frequently increase especially when he was on steroids  He says that he got intravenous steroids for his multiple sclerosis couple of weeks ago and blood sugars stayed high even with his increasing basal rate 200%  Again showing no consistent blood sugar patterns throughout the day  Blood sugars are variable overnight including a couple of low sugars  His blood sugars may be relatively higher around lunchtime and late evening  Blood sugars tend to come down around suppertime  He has had HYPOGLYCEMIA sporadically in the late evening and a couple of times over night.  Twice his low sugars in the evenings were related to mealtime  bolus  Mealtime boluses: He is usually fairly consistent with this but his highest blood sugars were on Saturday when he went off his diet on his birthday.  He will go on to using an extended bolus for higher fat meals usually good results  He says that his blood sugars do not come down as expected with correction boluses  Recently not as active  He thinks he can detect hypoglycemia symptoms fairly regularly Treatment of hypoglycemia: He is usually eating a half oatmeal cookie or drinking some juice   EXERCISE: no programmed activity   Wt Readings from Last 3 Encounters:  08/08/15 216 lb (97.977 kg)  05/09/15 212 lb 12.8 oz (96.525 kg)  02/01/15 208 lb 9.6 oz (94.62 kg)     Lab Results  Component Value Date   HGBA1C 8.0* 08/03/2015   HGBA1C 7.5* 05/03/2015   HGBA1C 7.5* 01/27/2015   Lab Results  Component Value Date   MICROALBUR 1.6 10/27/2014   [place] 97 08/03/2015   CREATININE 1.30 08/03/2015        Medication List       This list is accurate as of: 08/08/15 12:57 PM.  Always use your most recent med list.               acyclovir 400 MG tablet  Commonly known as:  ZOVIRAX     AMPYRA 10 MG Tb12  Generic drug:  dalfampridine  10 mg.     AUBAGIO 14 MG Tabs  Generic drug:  Teriflunomide  Reported on 08/08/2015     BAYER CONTOUR NEXT TEST test strip  Generic drug:  glucose blood  USE AS INSTRUCTED TO CHECK BLOOD SUGARS 8 TIMES PER DAY DX CODE 250.03     levothyroxine 75 MCG tablet  Commonly known as:  SYNTHROID, LEVOTHROID  TAKE ONE TABLET EVERY MORNING ON AN EMPTY STOMACH     lisinopril 10 MG tablet  Commonly known as:  PRINIVIL,ZESTRIL  Take 1 tablet (10 mg total) by mouth daily.     NOVOLOG 100 UNIT/ML injection  Generic drug:  insulin aspart  INJECT 80 UNITS INTO THE SKIN ONCE.     simvastatin 20 MG tablet  Commonly known as:  ZOCOR  TAKE 1 TABLET (20 MG TOTAL) BY MOUTH AT BEDTIME.        Allergies: No Known Allergies  Past Medical  History  Diagnosis Date  . Hyperlipidemia   . Hypertension   . Hypothyroidism   . 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 Multiple sclerosis ([place])   . Type 1 diabetes mellitus (HCC)     No past surgical history on file.  Family History  Problem Relation Age of Onset  . Diabetes Sister   . Colon cancer Maternal Grandmother   . Colon polyps Maternal Uncle     Social History:  reports that he has never smoked. He has never used smokeless tobacco. He reports that he does not drink alcohol or use illicit drugs.    Review of Systems      HYPERCHOLESTEROLEMIA: He has been on and off pravastatin and more recently has been taking simvastatin 20 mg with good control Liver functions are normal    Lab Results  Component Value Date   CHOL 157 08/03/2015   HDL 45.70 08/03/2015   LDLCALC 97 08/03/2015   LDLDIRECT 139.7 04/09/2014   TRIG 73.0 08/03/2015   CHOLHDL 3 08/03/2015   Lab Results  Component Value Date   ALT 27 08/03/2015    HYPERTENSION:  has  been on lisinopril with fairly good control, blood pressurebetter today  He has MULTIPLE sclerosis, has had very addition to his treatment recently and not clear if he is benefiting  HYPOTHYROIDISM: Adequately controlled with 75 g  Lab Results  Component Value Date   TSH 3.28 08/03/2015   TSH 2.10 01/27/2015   TSH 3.37 10/27/2014   FREET4 0.90 08/03/2015   FREET4 0.79 10/27/2014        Objective:   Physical Exam  BP 132/82 mmHg  Pulse 94  Temp(Src) 98.2 F (36.8 C)  Resp 14  Ht 6\' 1"  (1.854 m)  Wt 216 lb (97.977 kg)  BMI 28.50 kg/m2  SpO2 98%       Assessment:      DIABETES type I on insulin pump:  See history of present illness for analysis of his current blood sugar patterns and problems  His A1c is higher and has generally more difficulty controlling his sugars partly related to getting steroids pulses for his multiple sclerosis However has significant variability in his blood sugars at all  times Occasionally will have unexpected hypoglycemia also including a couple of episodes at 1 or 2 AM Difficult to get a consistent pattern to adjust his insulin as he has different levels of hyperglycemia at different times and not consistently Also may be getting more carbohydrates and diet when he is getting steroids,  continues to gain weight gradually  HYPERTENSION: His blood pressure is controlled with lisinopril  HYPOTHYROIDISM: TSH is normal   Plan:      Increase basal rate at 6 PM to 1.8 Increasing 6 AM basal rate 1.7 Correction factor 1: 20 Follow-up in 2 months Most likely if he is able to get the 670 model of the Medtronic pump he will benefit significantly and he will look into this.  Discussed the benefits of this  Counseling time on subjects discussed above is over 50% of today's 25 minute visit    Deshea Pooley

## 2015-08-11 DIAGNOSIS — E103553 Type 1 diabetes mellitus with stable proliferative diabetic retinopathy, bilateral: Secondary | ICD-10-CM | POA: Diagnosis not present

## 2015-08-11 DIAGNOSIS — H18832 Recurrent erosion of cornea, left eye: Secondary | ICD-10-CM | POA: Diagnosis not present

## 2015-08-11 DIAGNOSIS — H35373 Puckering of macula, bilateral: Secondary | ICD-10-CM | POA: Diagnosis not present

## 2015-08-11 DIAGNOSIS — H31009 Unspecified chorioretinal scars, unspecified eye: Secondary | ICD-10-CM | POA: Diagnosis not present

## 2015-08-16 DIAGNOSIS — L812 Freckles: Secondary | ICD-10-CM | POA: Diagnosis not present

## 2015-08-16 DIAGNOSIS — L821 Other seborrheic keratosis: Secondary | ICD-10-CM | POA: Diagnosis not present

## 2015-08-16 DIAGNOSIS — D225 Melanocytic nevi of trunk: Secondary | ICD-10-CM | POA: Diagnosis not present

## 2015-09-05 DIAGNOSIS — G35 Multiple sclerosis: Secondary | ICD-10-CM | POA: Diagnosis not present

## 2015-09-06 DIAGNOSIS — G35 Multiple sclerosis: Secondary | ICD-10-CM | POA: Diagnosis not present

## 2015-09-06 DIAGNOSIS — G5611 Other lesions of median nerve, right upper limb: Secondary | ICD-10-CM | POA: Diagnosis not present

## 2015-09-07 DIAGNOSIS — G35 Multiple sclerosis: Secondary | ICD-10-CM | POA: Diagnosis not present

## 2015-10-10 ENCOUNTER — Ambulatory Visit: Payer: Medicare Other | Admitting: Endocrinology

## 2015-10-10 ENCOUNTER — Other Ambulatory Visit: Payer: Self-pay | Admitting: *Deleted

## 2015-10-10 ENCOUNTER — Other Ambulatory Visit (INDEPENDENT_AMBULATORY_CARE_PROVIDER_SITE_OTHER): Payer: Medicare Other

## 2015-10-10 DIAGNOSIS — E1065 Type 1 diabetes mellitus with hyperglycemia: Secondary | ICD-10-CM

## 2015-10-10 DIAGNOSIS — E108 Type 1 diabetes mellitus with unspecified complications: Secondary | ICD-10-CM

## 2015-10-10 DIAGNOSIS — E039 Hypothyroidism, unspecified: Secondary | ICD-10-CM | POA: Diagnosis not present

## 2015-10-10 DIAGNOSIS — IMO0002 Reserved for concepts with insufficient information to code with codable children: Secondary | ICD-10-CM

## 2015-10-10 LAB — COMPREHENSIVE METABOLIC PANEL
ALT: 35 U/L (ref 0–53)
AST: 27 U/L (ref 0–37)
Albumin: 4.4 g/dL (ref 3.5–5.2)
Alkaline Phosphatase: 92 U/L (ref 39–117)
BUN: 17 mg/dL (ref 6–23)
CO2: 30 mEq/L (ref 19–32)
Calcium: 9.6 mg/dL (ref 8.4–10.5)
Chloride: 102 mEq/L (ref 96–112)
Creatinine, Ser: 1.15 mg/dL (ref 0.40–1.50)
GFR: 89.19 mL/min (ref >60.00)
Glucose, Bld: 139 mg/dL — ABNORMAL HIGH (ref 70–99)
Potassium: 4.4 mEq/L (ref 3.5–5.1)
Sodium: 138 mEq/L (ref 135–145)
Total Bilirubin: 0.5 mg/dL (ref 0.2–1.2)
Total Protein: 7.1 g/dL (ref 6.0–8.3)

## 2015-10-10 LAB — LIPID PANEL
Cholesterol: 169 mg/dL (ref 0–200)
HDL: 48.6 mg/dL (ref >39.00)
LDL Cholesterol: 111 mg/dL — ABNORMAL HIGH (ref 0–99)
NonHDL: 120.74
Total CHOL/HDL Ratio: 3
Triglycerides: 49 mg/dL (ref 0.0–149.0)
VLDL: 9.8 mg/dL (ref 0.0–40.0)

## 2015-10-10 LAB — TSH: TSH: 5 u[IU]/mL — ABNORMAL HIGH (ref 0.35–4.50)

## 2015-10-12 ENCOUNTER — Other Ambulatory Visit: Payer: Self-pay | Admitting: *Deleted

## 2015-10-12 MED ORDER — GLUCOSE BLOOD VI STRP: ORAL_STRIP | Status: DC

## 2015-10-14 ENCOUNTER — Ambulatory Visit (INDEPENDENT_AMBULATORY_CARE_PROVIDER_SITE_OTHER): Payer: Medicare Other | Admitting: Endocrinology

## 2015-10-14 ENCOUNTER — Encounter: Payer: Self-pay | Admitting: Endocrinology

## 2015-10-14 VITALS — BP 136/84 | HR 98 | Ht 73.0 in | Wt 217.0 lb

## 2015-10-14 DIAGNOSIS — I10 Essential (primary) hypertension: Secondary | ICD-10-CM | POA: Diagnosis not present

## 2015-10-14 DIAGNOSIS — E038 Other specified hypothyroidism: Secondary | ICD-10-CM

## 2015-10-14 DIAGNOSIS — E7800 Pure hypercholesterolemia, unspecified: Secondary | ICD-10-CM | POA: Diagnosis not present

## 2015-10-14 DIAGNOSIS — E1065 Type 1 diabetes mellitus with hyperglycemia: Secondary | ICD-10-CM

## 2015-10-14 DIAGNOSIS — E063 Autoimmune thyroiditis: Secondary | ICD-10-CM

## 2015-10-14 MED ORDER — LEVOTHYROXINE SODIUM 88 MCG PO TABS: 88.0000 ug | ORAL_TABLET | Freq: Every day | ORAL | Status: DC

## 2015-10-14 NOTE — Patient Instructions (Signed)
11 am 1.0; noon 1.2  Grilled food

## 2015-10-14 NOTE — Progress Notes (Signed)
Subjective:              Patient ID: MY SIDEL, male   DOB: February 07, 1971, 45 y.o.   MRN: [id number]  Diabetes   Diagnosis: Type 1 diabetes mellitus, date of diagnosis: 1995.   Insulin Pump followup:   CURRENT brand:  Medtronic 723 REVEL  HISTORY: An insulin pump has been in use since 03/13/11.   PUMP settings are basal rate:   1.5 until 4 a.m; 4 AM = 1.35. 6 a.m.= 1.7. 9 AM = 1.4,11 a.m. = 0.9. 12 noon = 1.2, 2 p.m. = 1. 45. 3 PM = 2.25 and 6 p.m. = 1.8  Carbohydrate to insulin ratio: 1: 10, at 11 a.m. = 1: 10, after 6 p.m. = 1:7, hyperglycemia correction factor: 1: 25,and after 10 p.m. 1:30, target 120, active insulin time 4 hours.  Changing infusion set every 3-4 days  Recent history: On his last visit he was told to increase his basal rate at 6 AM and 6 PM He was also having more trouble with control because of getting steroids for his MS Has not had a recent  A1c, previously 8.0  GLUCOSE data from pump download, checking 3.3 times a day recently:   Mean values apply above for all meters except median for One Touch  PRE-MEAL Fasting Lunch Afternoon  Bedtime Overall  Glucose range: 76-217  131-280  60-357  50-331    Mean/median: 119     177 +/- 85     Current blood sugar patterns, management and problems identified:  He has Much better fasting blood sugars compared to last visit with only occasional increase  Overnight blood sugars are not being checked except once had a glucose of 68.  He will usually have symptoms if he has low sugars at night  Blood sugars are usually going up midday and afternoon although he is checking these infrequently  His postprandial readings at night tend to be periodically high especially when he is eating out at fast food restaurants and not getting low fat choices  Not very active these days, may go out and do some gardening in the afternoon  Overall has checked blood sugars less frequently than before  Also his mealtimes having  variable and he is occasionally skipping meals especially breakfast  He thinks he can detect hypoglycemia symptoms fairly regularly Treatment of hypoglycemia: He is usually eating a half oatmeal cookie or drinking some juice  EXERCISE: no programmed activity   Wt Readings from Last 3 Encounters:  10/14/15 217 lb (98.431 kg)  08/08/15 216 lb (97.977 kg)  05/09/15 212 lb 12.8 oz (96.525 kg)     Lab Results  Component Value Date   HGBA1C 8.0* 08/03/2015   HGBA1C 7.5* 05/03/2015   HGBA1C 7.5* 01/27/2015   Lab Results  Component Value Date   MICROALBUR 1.6 10/27/2014   LDLCALC 111* 10/10/2015   CREATININE 1.15 10/10/2015        Medication List       This list is accurate as of: 10/14/15  9:19 AM.  Always use your most recent med list.               acyclovir 400 MG tablet  Commonly known as:  ZOVIRAX     AMPYRA 10 MG Tb12  Generic drug:  dalfampridine  10 mg.     AUBAGIO 14 MG Tabs  Generic drug:  Teriflunomide  Reported on 10/14/2015     glucose blood test strip  Commonly  known as:  BAYER CONTOUR NEXT TEST  USE AS INSTRUCTED TO CHECK BLOOD SUGARS 8 TIMES PER DAY DX CODE E10.65     levothyroxine 88 MCG tablet  Commonly known as:  SYNTHROID, LEVOTHROID  Take 1 tablet (88 mcg total) by mouth daily.     lisinopril 10 MG tablet  Commonly known as:  PRINIVIL,ZESTRIL  Take 1 tablet (10 mg total) by mouth daily.     NOVOLOG 100 UNIT/ML injection  Generic drug:  insulin aspart  INJECT 80 UNITS INTO THE SKIN ONCE.     simvastatin 20 MG tablet  Commonly known as:  ZOCOR  TAKE 1 TABLET (20 MG TOTAL) BY MOUTH AT BEDTIME.        Allergies: No Known Allergies  Past Medical History  Diagnosis Date  . Hyperlipidemia   . Hypertension   . Hypothyroidism   . 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 Multiple sclerosis ([place])   . Type 1 diabetes mellitus (HCC)     No past surgical history on file.  Family History  Problem Relation Age of Onset  .  Diabetes Sister   . Colon cancer Maternal Grandmother   . Colon polyps Maternal Uncle     Social History:  reports that he has never smoked. He has never used smokeless tobacco. He reports that he does not drink alcohol or use illicit drugs.    Review of Systems      HYPERCHOLESTEROLEMIA: He has been on and off pravastatin and more recently has been taking simvastatin 20 mg  Liver functions are normal  He has been needing more fast food and cholesterol has increased, previous LDL 97   Lab Results  Component Value Date   CHOL 169 10/10/2015   HDL 48.60 10/10/2015   LDLCALC 111* 10/10/2015   LDLDIRECT 139.7 04/09/2014   TRIG 49.0 10/10/2015   CHOLHDL 3 10/10/2015   Lab Results  Component Value Date   ALT 35 10/10/2015    HYPERTENSION:  has  been on lisinopril with fairly good control previously, blood pressure appears higher today  He has MULTIPLE sclerosis, has had Various treatments  HYPOTHYROIDISM: Usually controlled with 75 g TSH now relatively higher at 5.0.  He does feel sluggish but has other issues medically also  Lab Results  Component Value Date   TSH 5.00* 10/10/2015   TSH 3.28 08/03/2015   TSH 2.10 01/27/2015   FREET4 0.90 08/03/2015   FREET4 0.79 10/27/2014        Objective:   Physical Exam  BP 136/84 mmHg  Pulse 98  Ht 6\' 1"  (1.854 m)  Wt 217 lb (98.431 kg)  BMI 28.64 kg/m2  SpO2 98%       Assessment:      DIABETES type I on insulin pump:  See history of present illness for analysis of his current blood sugar patterns and problems  His blood sugars are somewhat better compared to his last visit when he was having high readings from steroids However now is getting more high readings after meals because of inconsistent diet especially with eating out at fast food restaurants in the evening Fasting readings are fairly consistent and near normal Only minimal hypoglycemia occasionally in the evenings with increasing his basal rate on the  last visit Most of his high readings appear to be midday and afternoon but not checking as much No hypoglycemia related to correcting high readings, sensitivity was changed on the last visit  HYPERTENSION: His blood pressure is not controlled with  lisinopril 10 mg but his higher readings may be related to getting more sodium with eating out at fast food restaurants regularly  HYPOTHYROIDISM: TSH is high and will need adjustment of his dose, currently on 75 g  Hyperlipidemia: LDL has gone somewhat higher but most likely from diet   Plan:      Increase basal rate from 11 AM to 1.0 and 12 noon up to 1.3 Encouraged him to start walking regularly More frequent glucose monitoring including after meals Consultation with dietitian for meal planning Advised to reduce high-fat foods at fast food restaurants  Recheck lipids and blood pressure on the next visit, no changes made in medication  Synthroid 88 g daily  Follow-up in 2 months    Counseling time on subjects discussed above is over 50% of today's 25 minute visit    Orson Rho

## 2015-10-19 DIAGNOSIS — Z5181 Encounter for therapeutic drug level monitoring: Secondary | ICD-10-CM | POA: Diagnosis not present

## 2015-10-19 DIAGNOSIS — Z79899 Other long term (current) drug therapy: Secondary | ICD-10-CM | POA: Diagnosis not present

## 2015-10-19 DIAGNOSIS — G35 Multiple sclerosis: Secondary | ICD-10-CM | POA: Diagnosis not present

## 2015-10-19 DIAGNOSIS — G5611 Other lesions of median nerve, right upper limb: Secondary | ICD-10-CM | POA: Diagnosis not present

## 2015-11-21 DIAGNOSIS — R262 Difficulty in walking, not elsewhere classified: Secondary | ICD-10-CM | POA: Diagnosis not present

## 2015-11-21 DIAGNOSIS — M6281 Muscle weakness (generalized): Secondary | ICD-10-CM | POA: Diagnosis not present

## 2015-11-21 DIAGNOSIS — R293 Abnormal posture: Secondary | ICD-10-CM | POA: Diagnosis not present

## 2015-11-21 DIAGNOSIS — R2681 Unsteadiness on feet: Secondary | ICD-10-CM | POA: Diagnosis not present

## 2015-11-23 DIAGNOSIS — R293 Abnormal posture: Secondary | ICD-10-CM | POA: Diagnosis not present

## 2015-11-23 DIAGNOSIS — R2681 Unsteadiness on feet: Secondary | ICD-10-CM | POA: Diagnosis not present

## 2015-11-23 DIAGNOSIS — M6281 Muscle weakness (generalized): Secondary | ICD-10-CM | POA: Diagnosis not present

## 2015-11-23 DIAGNOSIS — R262 Difficulty in walking, not elsewhere classified: Secondary | ICD-10-CM | POA: Diagnosis not present

## 2015-12-09 ENCOUNTER — Other Ambulatory Visit: Payer: Medicare Other

## 2015-12-14 ENCOUNTER — Ambulatory Visit: Payer: Medicare Other | Admitting: Endocrinology

## 2015-12-16 ENCOUNTER — Other Ambulatory Visit: Payer: Self-pay

## 2015-12-16 ENCOUNTER — Telehealth: Payer: Self-pay | Admitting: Endocrinology

## 2015-12-16 MED ORDER — INSULIN ASPART 100 UNIT/ML ~~LOC~~ SOLN: SUBCUTANEOUS | Status: DC

## 2015-12-16 NOTE — Telephone Encounter (Signed)
Pt needs the insulin to [facility] on [address]

## 2015-12-16 NOTE — Telephone Encounter (Signed)
Rx for novolog submitted to the pharmacy.

## 2015-12-16 NOTE — Telephone Encounter (Signed)
Faxed rx for novolog sent into patient pharmacy.

## 2015-12-26 ENCOUNTER — Other Ambulatory Visit (INDEPENDENT_AMBULATORY_CARE_PROVIDER_SITE_OTHER): Payer: Medicare Other

## 2015-12-26 ENCOUNTER — Other Ambulatory Visit: Payer: Self-pay | Admitting: Endocrinology

## 2015-12-26 DIAGNOSIS — E1065 Type 1 diabetes mellitus with hyperglycemia: Secondary | ICD-10-CM

## 2015-12-26 DIAGNOSIS — E038 Other specified hypothyroidism: Secondary | ICD-10-CM | POA: Diagnosis not present

## 2015-12-26 DIAGNOSIS — E063 Autoimmune thyroiditis: Secondary | ICD-10-CM

## 2015-12-26 LAB — COMPREHENSIVE METABOLIC PANEL
ALT: 28 U/L (ref 0–53)
AST: 23 U/L (ref 0–37)
Albumin: 4.4 g/dL (ref 3.5–5.2)
Alkaline Phosphatase: 95 U/L (ref 39–117)
BUN: 15 mg/dL (ref 6–23)
CO2: 28 mEq/L (ref 19–32)
Calcium: 9.6 mg/dL (ref 8.4–10.5)
Chloride: 103 mEq/L (ref 96–112)
Creatinine, Ser: 1.25 mg/dL (ref 0.40–1.50)
GFR: 80.92 mL/min (ref >60.00)
Glucose, Bld: 170 mg/dL — ABNORMAL HIGH (ref 70–99)
Potassium: 4.2 mEq/L (ref 3.5–5.1)
Sodium: 139 mEq/L (ref 135–145)
Total Bilirubin: 0.4 mg/dL (ref 0.2–1.2)
Total Protein: 7 g/dL (ref 6.0–8.3)

## 2015-12-26 LAB — LIPID PANEL
Cholesterol: 124 mg/dL (ref 0–200)
HDL: 41.2 mg/dL (ref >39.00)
LDL Cholesterol: 72 mg/dL (ref 0–99)
NonHDL: 82.93
Total CHOL/HDL Ratio: 3
Triglycerides: 54 mg/dL (ref 0.0–149.0)
VLDL: 10.8 mg/dL (ref 0.0–40.0)

## 2015-12-26 LAB — HEMOGLOBIN A1C: Hgb A1c MFr Bld: 7.8 % — ABNORMAL HIGH (ref 4.6–6.5)

## 2015-12-26 LAB — TSH: TSH: 1.88 u[IU]/mL (ref 0.35–4.50)

## 2015-12-29 ENCOUNTER — Ambulatory Visit (INDEPENDENT_AMBULATORY_CARE_PROVIDER_SITE_OTHER): Payer: Medicare Other | Admitting: Endocrinology

## 2015-12-29 ENCOUNTER — Encounter: Payer: Self-pay | Admitting: Endocrinology

## 2015-12-29 VITALS — BP 132/82 | HR 92 | Ht 73.0 in | Wt 216.0 lb

## 2015-12-29 DIAGNOSIS — E1065 Type 1 diabetes mellitus with hyperglycemia: Secondary | ICD-10-CM | POA: Diagnosis not present

## 2015-12-29 NOTE — Progress Notes (Signed)
Subjective:              Patient ID: Johnathan Ross, male   DOB: Nov 10, 1970, 45 y.o.   MRN: [id number]  Diabetes    Diagnosis: Type 1 diabetes mellitus, date of diagnosis: 1995.   Insulin Pump followup:   CURRENT brand:  Medtronic 723 REVEL  HISTORY: An insulin pump has been in use since 03/13/11.   PUMP settings are basal rate:   1.5 until 4 a.m; 4 AM = 1.35. 6 a.m.= 1.7. 9 AM = 1.4,11 a.m. = 0.9. 12 noon = 1.2, 2 p.m. = 1. 45. 3 PM = 2.25 and 6 p.m. = 1.8  Carbohydrate to insulin ratio: 1: 10, at 11 a.m. = 1: 10, after 6 p.m. = 1:7, hyperglycemia correction factor: 1: 20,and after 10 p.m. 1:30, target 120, active insulin time 4 hours.  Changing infusion set every 3-4 days  Recent history:  Still has a relatively high A1c of 7.8, previously 8.0  GLUCOSE data from pump download, checking 3.3 times a day recently:   Mean values apply above for all meters except median for One Touch  PRE-MEAL Fasting Lunch Dinner Bedtime Overall  Glucose range: 108-227  94-209  84-324  88-329    Mean/median: 182   227  191  183    Current blood sugar patterns, management and problems identified:  He has Somewhat high fasting blood sugars at times although inconsistent and more when he checks blood sugars around 6 AM  Did have one low sugar overnight  Blood sugars are mostly high related night but depending on blood sugars before supper  Again his highest blood sugars are overall around suppertime but is not checking consistently  Hypoglycemia has been rare with only one low blood sugar after breakfast and once overnight  He is not checking his blood sugars at mealtimes consistently when he is bolusing and only randomly at various times  He thinks he can detect hypoglycemia symptoms fairly regularly Treatment of hypoglycemia: He is usually eating a half oatmeal cookie or drinking some juice  EXERCISE: no programmed activity, somewhat more active in the afternoon   Wt Readings from  Last 3 Encounters:  12/29/15 216 lb (98 kg)  10/14/15 217 lb (98.4 kg)  08/08/15 216 lb (98 kg)     Lab Results  Component Value Date   HGBA1C 7.8 (H) 12/26/2015   HGBA1C 8.0 (H) 08/03/2015   HGBA1C 7.5 (H) 05/03/2015   Lab Results  Component Value Date   MICROALBUR 1.6 10/27/2014   LDLCALC 72 12/26/2015   CREATININE 1.25 12/26/2015        Medication List       Accurate as of 12/29/15  1:03 PM. Always use your most recent med list.          acyclovir 400 MG tablet Commonly known as:  ZOVIRAX   AMPYRA 10 MG Tb12 Generic drug:  dalfampridine 10 mg.   AUBAGIO 14 MG Tabs Generic drug:  Teriflunomide Reported on 10/14/2015   glucose blood test strip Commonly known as:  BAYER CONTOUR NEXT TEST USE AS INSTRUCTED TO CHECK BLOOD SUGARS 8 TIMES PER DAY DX CODE E10.65   insulin aspart 100 UNIT/ML injection Commonly known as:  NOVOLOG INJECT 80 UNITS INTO THE SKIN ONCE.   levothyroxine 88 MCG tablet Commonly known as:  SYNTHROID, LEVOTHROID Take 1 tablet (88 mcg total) by mouth daily.   lisinopril 10 MG tablet Commonly known as:  PRINIVIL,ZESTRIL Take 1 tablet (10 mg  total) by mouth daily.   simvastatin 20 MG tablet Commonly known as:  ZOCOR TAKE 1 TABLET BY MOUTH AT BEDTIME       Allergies: No Known Allergies  Past Medical History:  Diagnosis Date  . Hyperlipidemia   . Hypertension   . Hypothyroidism   . Multiple sclerosis ([place])   . 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 Type 1 diabetes mellitus (HCC)     No past surgical history on file.  Family History  Problem Relation Age of Onset  . Diabetes Sister   . Colon cancer Maternal Grandmother   . Colon polyps Maternal Uncle     Social History:  reports that he has never smoked. He has never used smokeless tobacco. He reports that he does not drink alcohol or use drugs.    Review of Systems      HYPERCHOLESTEROLEMIA: He has been on and off pravastatin and more recently has been  taking simvastatin 20 mg  Liver functions are normal  LDL much better, was advised to cut back on fast food previously  Lab Results  Component Value Date   CHOL 124 12/26/2015   HDL 41.20 12/26/2015   LDLCALC 72 12/26/2015   LDLDIRECT 139.7 04/09/2014   TRIG 54.0 12/26/2015   CHOLHDL 3 12/26/2015   Lab Results  Component Value Date   ALT 28 12/26/2015    HYPERTENSION:  has  been on lisinopril with fairly good control  He has MULTIPLE sclerosis, has had Various treatments  HYPOTHYROIDISM: Previously controlled with 75 g TSH now Better with increasing dose to 88 g Has some fatigue at times   Lab Results  Component Value Date   TSH 1.88 12/26/2015   TSH 5.00 (H) 10/10/2015   TSH 3.28 08/03/2015   FREET4 0.90 08/03/2015   FREET4 0.79 10/27/2014        Objective:   Physical Exam  BP 132/82   Pulse 92   Ht 6\' 1"  (1.854 m)   Wt 216 lb (98 kg)   SpO2 98%   BMI 28.50 kg/m        Assessment:      DIABETES type I on insulin pump:  See history of present illness for analysis of his current blood sugar patterns and problems  His blood sugars are somewhat Variable but no consistent pattern seen in and also not checking blood sugars enough Some of his high readings are probably because of not taking enough insulin for correction with meals as he is bolusing at mealtimes frequently without checking blood sugar Although fasting readings are trending to be higher they are not consistent and also has had a low sugar overnight Consistent with rising blood sugars after meals  HYPERTENSION: His blood pressure controlled now  HYPOTHYROIDISM: TSH is better with 88 g  Hyperlipidemia: LDL has gone back down with better diet   Plan:      Since he is not able to get CGM covered by insurance will get his Freestyle libre recording done for 2 weeks and assess details of his day-to-day patterns especially around mealtimes He will also discuss day-to-day management with  nurse educator after this is done No change in settings as yet More consistent glucose monitoring and always check blood sugars when planning a meal time bolus   There are no Patient Instructions on file for this visit.  Counseling time on subjects discussed above is over 50% of today's 25 minute visit    Chrisoula Zegarra

## 2016-01-04 ENCOUNTER — Encounter: Payer: Medicare Other | Admitting: Nutrition

## 2016-01-10 ENCOUNTER — Encounter: Payer: Medicare Other | Admitting: Nutrition

## 2016-01-12 DIAGNOSIS — Z5181 Encounter for therapeutic drug level monitoring: Secondary | ICD-10-CM | POA: Diagnosis not present

## 2016-01-12 DIAGNOSIS — Z79899 Other long term (current) drug therapy: Secondary | ICD-10-CM | POA: Diagnosis not present

## 2016-01-12 DIAGNOSIS — G35 Multiple sclerosis: Secondary | ICD-10-CM | POA: Diagnosis not present

## 2016-01-12 DIAGNOSIS — M62838 Other muscle spasm: Secondary | ICD-10-CM | POA: Diagnosis not present

## 2016-01-16 ENCOUNTER — Other Ambulatory Visit: Payer: Self-pay | Admitting: Endocrinology

## 2016-01-17 ENCOUNTER — Encounter: Payer: Medicare Other | Admitting: Nutrition

## 2016-01-24 ENCOUNTER — Encounter: Payer: Medicare Other | Admitting: Nutrition

## 2016-02-01 ENCOUNTER — Ambulatory Visit: Payer: Medicare Other | Admitting: Endocrinology

## 2016-02-01 ENCOUNTER — Encounter: Payer: Medicare Other | Attending: Endocrinology | Admitting: Nutrition

## 2016-02-01 NOTE — Assessment & Plan Note (Signed)
A Libre sensor was put on his upper left arm at 9AM.  He was instructed on how to fill out the diet/insulin history form.  He was instructed to not take tylenol, or large doses of Vitamin C.  He agreed not do take either of these.  He will return on the 15 for removal.   He was told that if it falls off, to return it to the office in a plastic bag.  He agreed to do this.

## 2016-02-01 NOTE — Patient Instructions (Signed)
Keep a record of what you are eating and your insulin doses for meal times If the device falls off, notify the office and return it to the office.

## 2016-02-07 ENCOUNTER — Ambulatory Visit: Payer: Medicare Other | Admitting: Endocrinology

## 2016-02-11 ENCOUNTER — Other Ambulatory Visit: Payer: Self-pay | Admitting: Endocrinology

## 2016-02-13 ENCOUNTER — Encounter: Payer: Medicare Other | Admitting: Nutrition

## 2016-02-14 NOTE — Patient Instructions (Signed)
Email me the diet log when you get home.

## 2016-02-14 NOTE — Assessment & Plan Note (Signed)
Sensor was removed from upper left arm.  No redness or swelling noted.  Sensor and pump were downloaded .  He forgot his log, and said he would Email it to me when he got home.

## 2016-02-15 ENCOUNTER — Ambulatory Visit (INDEPENDENT_AMBULATORY_CARE_PROVIDER_SITE_OTHER): Payer: Medicare Other | Admitting: Endocrinology

## 2016-02-15 ENCOUNTER — Encounter: Payer: Self-pay | Admitting: Endocrinology

## 2016-02-15 ENCOUNTER — Ambulatory Visit: Payer: Medicare Other | Admitting: Endocrinology

## 2016-02-15 VITALS — BP 130/76 | HR 93 | Temp 98.0°F | Resp 16 | Ht 73.0 in | Wt 214.6 lb

## 2016-02-15 DIAGNOSIS — E1065 Type 1 diabetes mellitus with hyperglycemia: Secondary | ICD-10-CM

## 2016-02-15 DIAGNOSIS — Z23 Encounter for immunization: Secondary | ICD-10-CM

## 2016-02-15 NOTE — Patient Instructions (Signed)
Calorie king app  Add 30-50% more insulin + extend for hi fat

## 2016-02-15 NOTE — Progress Notes (Signed)
Subjective:              Patient ID: Johnathan Ross, male   DOB: 1970-09-18, 45 y.o.   MRN: [id number]  Diabetes    Diagnosis: Type 1 diabetes mellitus, date of diagnosis: 1995.   Insulin Pump followup:   CURRENT brand:  Medtronic 723 REVEL  HISTORY: An insulin pump has been in use since 03/13/11.   PUMP settings are basal rate:   1.5 until 4 a.m; 4 AM = 1.35. 6 a.m.= 1.7. 9 AM = 1.4,11 a.m. = 0.9. 12 noon = 1.2, 2 p.m. = 1. 45. 3 PM = 2.25 and 6 p.m. = 1.8  Carbohydrate to insulin ratio: 1: 10, at 11 a.m. = 1: 10, after 6 p.m. = 1:7, hyperglycemia correction factor: 1: 20,and after 10 p.m. 1:30, target 120, active insulin time 4 hours.  Changing infusion set every 3-4 days  Recent history:  Most recently has a relatively high A1c of 7.8, previously 8.0  Current blood sugar patterns, management and problems identified:  He has periods of hyperglycemia daily as discussed in the CGM interpretation  These are somewhat variable times but appear to be occurring mostly during the day and appear to be postprandial; more frequently occurring in the afternoons and occasionally after evening meal  He did not bring his food diary and insulin doses with his CGM initially but subsequently Review of his food intake indicates consistently high fat meals on a daily basis  He adds only one unit of insulin for higher fat meals and although he thinks he is extending his boluses his pump download the not indicated any extended boluses blood  Generally has fairly good fasting readings with some variability  Overall the highest blood sugars are between 2-6 PM but not consistent  Only rarely eating C has issues with infusion set problems  He did have excellent blood sugars on 9/12 throughout the day on his fingersticks  Hypoglycemia has been minimal and mostly around midday, only rarely after a bolus  He says he is somewhat active during the day doing wood work but no aerobic  activity   GLUCOSE data Analysis  CONTINUOUS GLUCOSE MONITORING RECORD INTERPRETATION    Dates of Recording: 9/6 through 02/13/16  Sensor  summary:  Average blood glucose for the period of recording is 179 Blood sugars are within target range between 16 up to 47% of the time.  Blood sugars are in the low range below 80 anywhere from 0-15% of the time       Glycemic patterns:  Hyperglycemic episodes are occurring daily but at variable times Most frequent hyperglycemic episodes are occurring midday and afternoon but occasionally after about 6 PM and only twice during the night Hypoglycemic episodes occurred transiently and only with minimal decrease in blood sugar at variable times, mostly earlier late morning and early afternoon     Overnight periods:  Her sugars are generally well controlled through the night in the target range but for high significantly on 2 of the nights, at least once preceded by high sugar the night before.  Blood sugars between midnight and 8 AM are averaging between about 144-165     Preprandial periods:  Blood sugars averaging around 160 between 8-10 AM, 199 midday and around 220 around 6 PM with most variability at suppertime and some in the morning also     Postprandial periods:  Not able to assess since he did not bring his food diary but does tend to  have high sugars a few times after evening meal and also at times in the afternoon after 12 noon.  Blood sugars spike after breakfast occurred only once on 9/10     Hypoglycemia: This was minimal and occurring usually in the morning hours or once transiently before 6 AM Hypoglycemic episodes occurred transiently and only with minimal decrease in blood sugar at variable times, mostly earlier late morning and early afternoon     Mean values apply above for all meters except median for One Touch  PRE-MEAL Fasting Lunch Dinner Bedtime Overall  Glucose range: 78-364       Mean/median:  191  150  209  175+/-86      He thinks he can detect hypoglycemia symptoms fairly regularly Treatment of hypoglycemia: He is usually eating a half oatmeal cookie or drinking some juice  EXERCISE: no programmed activity, somewhat more active in the afternoon   Wt Readings from Last 3 Encounters:  02/15/16 214 lb 9.6 oz (97.3 kg)  12/29/15 216 lb (98 kg)  10/14/15 217 lb (98.4 kg)     Lab Results  Component Value Date   HGBA1C 7.8 (H) 12/26/2015   HGBA1C 8.0 (H) 08/03/2015   HGBA1C 7.5 (H) 05/03/2015   Lab Results  Component Value Date   MICROALBUR 1.6 10/27/2014   LDLCALC 72 12/26/2015   CREATININE 1.25 12/26/2015        Medication List       Accurate as of 02/15/16  8:58 PM. Always use your most recent med list.          acyclovir 400 MG tablet Commonly known as:  ZOVIRAX   AMPYRA 10 MG Tb12 Generic drug:  dalfampridine 10 mg.   AUBAGIO 14 MG Tabs Generic drug:  Teriflunomide Reported on 10/14/2015   glucose blood test strip Commonly known as:  BAYER CONTOUR NEXT TEST USE AS INSTRUCTED TO CHECK BLOOD SUGARS 8 TIMES PER DAY DX CODE E10.65   insulin aspart 100 UNIT/ML injection Commonly known as:  NOVOLOG INJECT 80 UNITS INTO THE SKIN ONCE.   levothyroxine 88 MCG tablet Commonly known as:  SYNTHROID, LEVOTHROID TAKE 1 TABLET BY MOUTH DAILY   lisinopril 10 MG tablet Commonly known as:  PRINIVIL,ZESTRIL TAKE 1 TABLET BY MOUTH EVERY DAY   simvastatin 20 MG tablet Commonly known as:  ZOCOR TAKE 1 TABLET BY MOUTH AT BEDTIME       Allergies: No Known Allergies  Past Medical History:  Diagnosis Date  . Hyperlipidemia   . Hypertension   . Hypothyroidism   . Multiple sclerosis ([place])   . 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 Type 1 diabetes mellitus (HCC)     No past surgical history on file.  Family History  Problem Relation Age of Onset  . Diabetes Sister   . Colon cancer Maternal Grandmother   . Colon polyps Maternal Uncle     Social History:  reports  that he has never smoked. He has never used smokeless tobacco. He reports that he does not drink alcohol or use drugs.    Review of Systems      HYPERCHOLESTEROLEMIA: He has been on and off pravastatin and more recently has been taking simvastatin 20 mg  Liver functions are normal  LDL much better, was advised to cut back on fast food   Lab Results  Component Value Date   CHOL 124 12/26/2015   HDL 41.20 12/26/2015   LDLCALC 72 12/26/2015   LDLDIRECT 139.7 04/09/2014   TRIG 54.0  12/26/2015   CHOLHDL 3 12/26/2015   Lab Results  Component Value Date   ALT 28 12/26/2015    HYPERTENSION:  has  been on lisinopril with fairly good control  He has MULTIPLE sclerosis, has had Various treatments  Has less neurological symptoms, asking about increased sensitivity of his feet  HYPOTHYROIDISM: Currently on 88 g with the last TSH normal    Lab Results  Component Value Date   TSH 1.88 12/26/2015   TSH 5.00 (H) 10/10/2015   TSH 3.28 08/03/2015   FREET4 0.90 08/03/2015   FREET4 0.79 10/27/2014        Objective:   Physical Exam  BP 130/76   Pulse 93   Temp 98 F (36.7 C)   Resp 16   Ht 6\' 1"  (1.854 m)   Wt 214 lb 9.6 oz (97.3 kg)   SpO2 97%   BMI 28.31 kg/m     Diabetic Foot Exam - Simple   Simple Foot Form Diabetic Foot exam was performed with the following findings:  Yes 02/15/2016  2:51 PM  Visual Inspection No deformities, no ulcerations, no other skin breakdown bilaterally:  Yes Sensation Testing Intact to touch and monofilament testing bilaterally:  Yes Pulse Check Posterior Tibialis and Dorsalis pulse intact bilaterally:  Yes Comments        Assessment:      DIABETES type I on insulin pump:  See history of present illness for analysis of his current blood sugar patterns and problems  His blood sugars are Not well controlled with analysis of his continued glucose monitoring this month revealing significant periods of hyperglycemia on most days daily  postprandially although difficult to correlate his data with his meal intake since His not bringing the home diary with him today However he definitely has excessively high fat meals on a regular basis and not adjusting for this Nocturnal blood sugars are usually fairly good with only occasional low normal readings high readings occasionally may be related from high-fat intake the night before  HYPERTENSION: His blood pressure controlled      Plan:      Discussed how to cover high-fat meals with additional 30-50% increased insulin on top of the usual carbohydrate coverage He will extend her boluses from 1-4 hours based on fat intake He will use a calorie [employer] application on his phone to help him understand nutritional values and bolus accordingly He will also sit down with the nurse educator to review his day-to-day management based on his home diary Ideally he should cut back on his fat intake to help his control No change in settings at this time May consider increasing carbohydrate coverage at lunchtime, however his glucose is not consistently high after lunch   Patient Instructions  Calorie king app  Add 30-50% more insulin + extend for hi fat     Counseling time on subjects discussed above is over 50% of today's 25 minute visit    Jashanti Clinkscale

## 2016-03-23 ENCOUNTER — Telehealth: Payer: Self-pay | Admitting: Endocrinology

## 2016-03-23 NOTE — Telephone Encounter (Signed)
Patient state that there has been some confusions about his test strips on how many times he test a day, and it delaying him receiving his pump. Please advise

## 2016-03-26 ENCOUNTER — Telehealth: Payer: Self-pay

## 2016-03-26 NOTE — Telephone Encounter (Signed)
Pt is calling back again, he said he has called today and Friday without a response yet from the nurse.  Please call back as soon as possible.

## 2016-03-26 NOTE — Telephone Encounter (Signed)
Called patient, advised him to call medtronic and get them to fax [place] the information again so we can fix how many times a day he is testing, as he is testing 8 times a day, and not 3. Will get Dr.Kumar to sign paper when they are faxed over.

## 2016-03-27 ENCOUNTER — Telehealth: Payer: Self-pay

## 2016-03-27 ENCOUNTER — Other Ambulatory Visit (INDEPENDENT_AMBULATORY_CARE_PROVIDER_SITE_OTHER): Payer: Medicare Other

## 2016-03-27 DIAGNOSIS — E1065 Type 1 diabetes mellitus with hyperglycemia: Secondary | ICD-10-CM

## 2016-03-27 LAB — COMPREHENSIVE METABOLIC PANEL
ALT: 22 U/L (ref 0–53)
AST: 21 U/L (ref 0–37)
Albumin: 4.5 g/dL (ref 3.5–5.2)
Alkaline Phosphatase: 102 U/L (ref 39–117)
BUN: 13 mg/dL (ref 6–23)
CO2: 30 mEq/L (ref 19–32)
Calcium: 9.7 mg/dL (ref 8.4–10.5)
Chloride: 101 mEq/L (ref 96–112)
Creatinine, Ser: 1.18 mg/dL (ref 0.40–1.50)
GFR: 86.39 mL/min (ref >60.00)
Glucose, Bld: 139 mg/dL — ABNORMAL HIGH (ref 70–99)
Potassium: 3.9 mEq/L (ref 3.5–5.1)
Sodium: 139 mEq/L (ref 135–145)
Total Bilirubin: 0.5 mg/dL (ref 0.2–1.2)
Total Protein: 7.1 g/dL (ref 6.0–8.3)

## 2016-03-27 LAB — HEMOGLOBIN A1C: Hgb A1c MFr Bld: 7.6 % — ABNORMAL HIGH (ref 4.6–6.5)

## 2016-03-27 NOTE — Telephone Encounter (Signed)
Called and notified patient he needed his pump or meter information so we can download it and see how many times a day he is testing for his medtronic information, patient has an appointment tomorrow, and we will download then and put it with the paperwork.

## 2016-03-28 ENCOUNTER — Ambulatory Visit (INDEPENDENT_AMBULATORY_CARE_PROVIDER_SITE_OTHER): Payer: Medicare Other | Admitting: Endocrinology

## 2016-03-28 ENCOUNTER — Encounter: Payer: Self-pay | Admitting: Endocrinology

## 2016-03-28 VITALS — BP 122/64 | HR 86 | Temp 98.2°F | Resp 16 | Wt 219.2 lb

## 2016-03-28 DIAGNOSIS — E1065 Type 1 diabetes mellitus with hyperglycemia: Secondary | ICD-10-CM | POA: Diagnosis not present

## 2016-03-28 DIAGNOSIS — I10 Essential (primary) hypertension: Secondary | ICD-10-CM

## 2016-03-28 NOTE — Patient Instructions (Signed)
3 pm basal 2.15

## 2016-03-28 NOTE — Progress Notes (Signed)
Subjective:              Patient ID: Johnathan Ross, male   DOB: [birth year], 45 y.o.   MRN: [id number]  Diabetes    Diagnosis: Type 1 diabetes mellitus, date of diagnosis: 1995.   Insulin Pump followup:   CURRENT brand:  Medtronic 723 REVEL  HISTORY: An insulin pump has been in use since 03/13/11.   PUMP settings are basal rate:   1.5 until 4 a.m; 4 AM = 1.35. 6 a.m.= 1.7. 9 AM = 1.4,11 a.m. = 0.9. 12 noon = 1.2, 2 p.m. = 1. 45. 3 PM = 2.25 and 6 p.m. = 1.8  Carbohydrate to insulin ratio: 1: 10, at 11 a.m. = 1: 10, after 6 p.m. = 1:7, hyperglycemia correction factor: 1: 20,and after 10 p.m. 1:30, target 120, active insulin time 4 hours.  Changing infusion set every 3-4 days  Recent history:  Most recently has a relatively Better A1c of 7.6, usually higher  Current blood sugar patterns, management and problems identified:  He has had continued variability in his blood sugars at all times  FASTING blood sugars were relatively low 10 days ago but the last few days they've been near normal  Also has sporadic instances of low blood sugars either overnight or occasionally around 5-6 PM  Has tendency to rebound with low blood sugars  Also postprandial blood sugars are somewhat variable; on an average after supper blood sugars are relatively flat  May have occasional compliance issue with bolusing before meals  Only rarely appears to be having a very high sugar based on infusion set problem  He does have a little better outcome with trying to use better resources for carbohydrate counting and using some extended boluses  He says he is somewhat active during the day doing wood work but no aerobic activity  He thinks he can detect hypoglycemia symptoms fairly regularly Treatment of hypoglycemia: He is usually drinking juice for this  GLUCOSE monitoring results: Recently checking 6.1 times a day  Mean values apply above for all meters except median for One Touch  PRE-MEAL  Fasting Lunch Dinner Bedtime Overall  Glucose range: 57-293   52-265  63-312    Mean/median: 143  127  181  189  171+/-84     EXERCISE: no programmed activity, somewhat more active in the afternoon   Wt Readings from Last 3 Encounters:  03/28/16 219 lb 3.2 oz (99.4 kg)  02/15/16 214 lb 9.6 oz (97.3 kg)  12/29/15 216 lb (98 kg)     Lab Results  Component Value Date   HGBA1C 7.6 (H) 03/27/2016   HGBA1C 7.8 (H) 12/26/2015   HGBA1C 8.0 (H) 08/03/2015   Lab Results  Component Value Date   MICROALBUR 1.6 10/27/2014   LDLCALC 72 12/26/2015   CREATININE 1.18 03/27/2016        Medication List       Accurate as of 03/28/16 10:05 AM. Always use your most recent med list.          acyclovir 400 MG tablet Commonly known as:  ZOVIRAX   AMPYRA 10 MG Tb12 Generic drug:  dalfampridine 10 mg.   AUBAGIO 14 MG Tabs Generic drug:  Teriflunomide Reported on 10/14/2015   glucose blood test strip Commonly known as:  BAYER CONTOUR NEXT TEST USE AS INSTRUCTED TO CHECK BLOOD SUGARS 8 TIMES PER DAY DX CODE E10.65   insulin aspart 100 UNIT/ML injection Commonly known as:  NOVOLOG INJECT 80 UNITS  INTO THE SKIN ONCE.   levothyroxine 88 MCG tablet Commonly known as:  SYNTHROID, LEVOTHROID TAKE 1 TABLET BY MOUTH DAILY   lisinopril 10 MG tablet Commonly known as:  PRINIVIL,ZESTRIL TAKE 1 TABLET BY MOUTH EVERY DAY   simvastatin 20 MG tablet Commonly known as:  ZOCOR TAKE 1 TABLET BY MOUTH AT BEDTIME       Allergies: No Known Allergies  Past Medical History:  Diagnosis Date  . Hyperlipidemia   . Hypertension   . Hypothyroidism   . Multiple sclerosis ([place])   . 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 Type 1 diabetes mellitus (HCC)     No past surgical history on file.  Family History  Problem Relation Age of Onset  . Diabetes Sister   . Colon cancer Maternal Grandmother   . Colon polyps Maternal Uncle     Social History:  reports that he has never smoked.  He has never used smokeless tobacco. He reports that he does not drink alcohol or use drugs.    Review of Systems      HYPERCHOLESTEROLEMIA: He has been on and off pravastatin and more recently has been taking simvastatin 20 mg  Liver functions are normal consistently  LDL much better in July   Lab Results  Component Value Date   CHOL 124 12/26/2015   HDL 41.20 12/26/2015   LDLCALC 72 12/26/2015   LDLDIRECT 139.7 04/09/2014   TRIG 54.0 12/26/2015   CHOLHDL 3 12/26/2015   Lab Results  Component Value Date   ALT 22 03/27/2016    HYPERTENSION:  has  been on lisinopril with  good control  He has MULTIPLE sclerosis, has had Regular follow-up, still having residual symptoms  HYPOTHYROIDISM: Currently on 88 g with the last TSH normal    Lab Results  Component Value Date   TSH 1.88 12/26/2015   TSH 5.00 (H) 10/10/2015   TSH 3.28 08/03/2015   FREET4 0.90 08/03/2015   FREET4 0.79 10/27/2014        Objective:   Physical Exam  BP 122/64 (BP Location: Right Arm, Patient Position: Sitting, Cuff Size: Large)   Pulse 86   Temp 98.2 F (36.8 C) (Oral)   Resp 16   Wt 219 lb 3.2 oz (99.4 kg)   SpO2 97%   BMI 28.92 kg/m       Assessment:      DIABETES type I on insulin pump:  See history of present illness for analysis of his current blood sugar patterns and problems  His blood sugars are not showing any consistent pattern and showing variability with standard deviation 84 Overall blood sugars are still high with average 171 This is with his checking blood sugars at least 6 times a day on an average  However A1c is slightly better at 7.6 Current problems include variability of blood sugars at all times with no consistent pattern Also has sporadic instances of low blood sugars either overnight or occasionally around 5-6 PM Has tendency to rebound with low blood sugars Also postprandial blood sugars are somewhat variable May have occasional compliance issue with  bolusing before meals He does have a little better outcome with trying to use better resources for carbohydrate counting and using some extended boluses  HYPERTENSION: His blood pressure is well controlled      Plan:      Discussed how to Avoid problems discussed above with better management He would try not to override the pump excessively Continue checking blood sugars consistently and  try to get the new 630 insulin pump Carry glucose tablets when he is not at home for treating low blood sugars Call if having more frequent low sugars overnight Avoid stacking boluses Reduce BASAL rate at 3 PM down to 2.15 Sensitivity 1:30 overnight instead of 1:20 Check urine microalbumin, he was unable to void today   Patient Instructions  3 pm basal 2.15   Counseling time on subjects discussed above is over 50% of today's 25 minute visit    Kalai Baca

## 2016-04-03 ENCOUNTER — Telehealth: Payer: Self-pay | Admitting: Endocrinology

## 2016-04-03 NOTE — Telephone Encounter (Signed)
Patient said he needs the paperwork for the pump Dr. Dwyane Dee prescribed for him sent to [facility], but they said they have not received the paperwork. He said this has been going on for three weeks now, and he needs it taken care of.

## 2016-04-03 NOTE — Telephone Encounter (Signed)
Pt requests a call back from Dr. Ronnie Derby nurse as soon as possible. [phone]

## 2016-04-04 NOTE — Telephone Encounter (Signed)
LVM for Pt informing him we have faxed his forms twice this morning with confirmations both times.  Advised him to give [place] a call if he has any questions or concerns.

## 2016-04-11 ENCOUNTER — Other Ambulatory Visit: Payer: Self-pay | Admitting: Endocrinology

## 2016-04-23 DIAGNOSIS — Z79899 Other long term (current) drug therapy: Secondary | ICD-10-CM | POA: Diagnosis not present

## 2016-04-23 DIAGNOSIS — R5383 Other fatigue: Secondary | ICD-10-CM | POA: Diagnosis not present

## 2016-04-23 DIAGNOSIS — M62838 Other muscle spasm: Secondary | ICD-10-CM | POA: Diagnosis not present

## 2016-04-23 DIAGNOSIS — G35 Multiple sclerosis: Secondary | ICD-10-CM | POA: Diagnosis not present

## 2016-05-11 ENCOUNTER — Telehealth: Payer: Self-pay | Admitting: Endocrinology

## 2016-05-11 ENCOUNTER — Other Ambulatory Visit: Payer: Self-pay

## 2016-05-11 MED ORDER — LEVOTHYROXINE SODIUM 88 MCG PO TABS: 88.0000 ug | ORAL_TABLET | Freq: Every day | ORAL | 3 refills | Status: DC

## 2016-05-11 NOTE — Telephone Encounter (Signed)
Please tell patient that his thyroid level indicates excessive thyroid medication currently.  Needs to change his levothyroxine to a new dose of 125 g, half tablet daily. Will need to have labs done in about a month with TSH and free T4

## 2016-05-11 NOTE — Telephone Encounter (Signed)
Pt is stating that Dr. Doyle Askew is sending over labs for [place] to review. Please see if this was received and then have them reviewed and call pt if any changes need to be made.

## 2016-05-11 NOTE — Telephone Encounter (Signed)
Refill of levothyroxine.send to  [id number] - [place], [place] [id number] (Phone) [phone] (Fax)

## 2016-05-11 NOTE — Telephone Encounter (Signed)
Ordered 05/11/16

## 2016-05-30 ENCOUNTER — Telehealth: Payer: Self-pay | Admitting: Endocrinology

## 2016-06-01 MED ORDER — GLUCOSE BLOOD VI STRP: ORAL_STRIP | 3 refills | Status: DC

## 2016-06-01 NOTE — Telephone Encounter (Signed)
Refill submitted. Refill was submitted in error the first time.

## 2016-06-01 NOTE — Telephone Encounter (Signed)
Patient state only got 200 strips, he is used to getting 280, so his pharmacy is sending another request for 80 more strips.  Please advise  on why it was changed.  [id number] - [place], [place] - [place] [id number] (Phone) [phone] (Fax)

## 2016-06-04 DIAGNOSIS — H3562 Retinal hemorrhage, left eye: Secondary | ICD-10-CM | POA: Diagnosis not present

## 2016-06-04 DIAGNOSIS — H35373 Puckering of macula, bilateral: Secondary | ICD-10-CM | POA: Diagnosis not present

## 2016-06-04 DIAGNOSIS — H35351 Cystoid macular degeneration, right eye: Secondary | ICD-10-CM | POA: Diagnosis not present

## 2016-06-04 DIAGNOSIS — E103553 Type 1 diabetes mellitus with stable proliferative diabetic retinopathy, bilateral: Secondary | ICD-10-CM | POA: Diagnosis not present

## 2016-06-07 LAB — HM DIABETES EYE EXAM

## 2016-06-17 ENCOUNTER — Encounter: Payer: Self-pay | Admitting: Endocrinology

## 2016-06-18 ENCOUNTER — Telehealth: Payer: Self-pay | Admitting: Endocrinology

## 2016-06-18 NOTE — Telephone Encounter (Signed)
Pt called in and said that he has not heard anything back about the lab results that were faxed over to [place]. Please advise.

## 2016-06-18 NOTE — Telephone Encounter (Signed)
Have not received any lab work for this patient

## 2016-06-19 ENCOUNTER — Telehealth: Payer: Self-pay | Admitting: Endocrinology

## 2016-06-19 NOTE — Telephone Encounter (Signed)
Needs to change to full tab of 88ug for now

## 2016-06-19 NOTE — Telephone Encounter (Signed)
Labs received and put on Dr. Ronnie Derby desk

## 2016-06-19 NOTE — Telephone Encounter (Signed)
Lab report from 06/04/16 shows TSH 46. He is supposed to be on half of 125 g levothyroxine but chart indicates he is on 88 g, need to confirm what he is taking.   If he is on 88 g will need to have him take the full tablet of 125 g and see me in one month with labs including TSH and free T4

## 2016-06-20 NOTE — Telephone Encounter (Signed)
Gave the patient instructions and he stated an understanding

## 2016-06-25 ENCOUNTER — Other Ambulatory Visit: Payer: Medicare Other

## 2016-06-28 ENCOUNTER — Ambulatory Visit: Payer: Medicare Other | Admitting: Endocrinology

## 2016-07-06 ENCOUNTER — Other Ambulatory Visit (INDEPENDENT_AMBULATORY_CARE_PROVIDER_SITE_OTHER): Payer: Medicare Other

## 2016-07-06 DIAGNOSIS — E1065 Type 1 diabetes mellitus with hyperglycemia: Secondary | ICD-10-CM

## 2016-07-06 LAB — COMPREHENSIVE METABOLIC PANEL
ALT: 34 U/L (ref 0–53)
AST: 33 U/L (ref 0–37)
Albumin: 4.6 g/dL (ref 3.5–5.2)
Alkaline Phosphatase: 97 U/L (ref 39–117)
BUN: 15 mg/dL (ref 6–23)
CO2: 32 mEq/L (ref 19–32)
Calcium: 9.6 mg/dL (ref 8.4–10.5)
Chloride: 101 mEq/L (ref 96–112)
Creatinine, Ser: 1.3 mg/dL (ref 0.40–1.50)
GFR: 77.15 mL/min (ref >60.00)
Glucose, Bld: 200 mg/dL — ABNORMAL HIGH (ref 70–99)
Potassium: 4.5 mEq/L (ref 3.5–5.1)
Sodium: 137 mEq/L (ref 135–145)
Total Bilirubin: 0.4 mg/dL (ref 0.2–1.2)
Total Protein: 7.3 g/dL (ref 6.0–8.3)

## 2016-07-06 LAB — TSH: TSH: 38.18 u[IU]/mL — ABNORMAL HIGH (ref 0.35–4.50)

## 2016-07-06 LAB — HEMOGLOBIN A1C: Hgb A1c MFr Bld: 8 % — ABNORMAL HIGH (ref 4.6–6.5)

## 2016-07-11 ENCOUNTER — Encounter: Payer: Self-pay | Admitting: Endocrinology

## 2016-07-11 ENCOUNTER — Other Ambulatory Visit: Payer: Self-pay

## 2016-07-11 ENCOUNTER — Ambulatory Visit (INDEPENDENT_AMBULATORY_CARE_PROVIDER_SITE_OTHER): Payer: Medicare Other | Admitting: Endocrinology

## 2016-07-11 VITALS — BP 128/80 | HR 84 | Ht 73.0 in | Wt 223.0 lb

## 2016-07-11 DIAGNOSIS — I10 Essential (primary) hypertension: Secondary | ICD-10-CM | POA: Diagnosis not present

## 2016-07-11 DIAGNOSIS — E1065 Type 1 diabetes mellitus with hyperglycemia: Secondary | ICD-10-CM

## 2016-07-11 DIAGNOSIS — E063 Autoimmune thyroiditis: Secondary | ICD-10-CM

## 2016-07-11 DIAGNOSIS — E039 Hypothyroidism, unspecified: Secondary | ICD-10-CM

## 2016-07-11 DIAGNOSIS — E038 Other specified hypothyroidism: Secondary | ICD-10-CM

## 2016-07-11 MED ORDER — LEVOTHYROXINE SODIUM 125 MCG PO TABS: 125.0000 ug | ORAL_TABLET | Freq: Every day | ORAL | 3 refills | Status: DC

## 2016-07-11 NOTE — Progress Notes (Signed)
Subjective:              Patient ID: Johnathan Ross, male   DOB: [birth year], 46 y.o.   MRN: [id number]  Diabetes    Diagnosis: Type 1 diabetes mellitus, date of diagnosis: 1995.   Insulin Pump followup:   CURRENT brand:  Medtronic 723 REVEL  HISTORY: An insulin pump has been in use since 03/13/11.   PUMP settings are basal rate:   1.5 until 4 a.m; 4 AM = 1.35. 6 a.m.= 1.7. 9 AM = 1.4,11 a.m. = 0.9. 12 noon = 1.2, 2 p.m. = 1. 45. 3 PM = 2.15 and 6 p.m. = 1.8  Carbohydrate to insulin ratio: 1: 10, at 11 a.m. = 1: 10, after 6 p.m. = 1:7, hyperglycemia correction factor: 1: 20,and after 10 p.m. 1:30, target 120, active insulin time 4 hours.  Changing infusion set every 3-4 days  Recent history:  His A1c has gone up to 8%, previously 7.6  Current blood sugar patterns, management and problems identified:  FASTING blood sugars have been overall fairly good although has some variability marked 3 readings over 200 the morning  He also has had low blood sugar once during the night but otherwise nighttime blood sugars are variable and he checks them  HIGHEST blood sugars appear to be in the early afternoon and frequently over 200; some of these are before and some of them are after lunch  POSTPRANDIAL readings are not consistently high with only a few high readings after supper  Postprandial readings are averaging 138 after supper 6 readings analyzed, did have a reading of 69 once  Has not had any hypoglycemia during the day with his activity being somewhat limited  More recently has not been overriding his pump as much as before as instructed  He is checking his sugar 4 times a day on an average  He thinks he can detect hypoglycemia symptoms fairly regularly Treatment of hypoglycemia: He is usually drinking juice for this  GLUCOSE monitoring results:   AVERAGE overall 169+/-76 Fasting average 151 Lunchtime average 167, suppertime average 182 and bedtime 167   EXERCISE:  no programmed activity, somewhat more active in the afternoon   Wt Readings from Last 3 Encounters:  07/11/16 223 lb (101.2 kg)  03/28/16 219 lb 3.2 oz (99.4 kg)  02/15/16 214 lb 9.6 oz (97.3 kg)     Lab Results  Component Value Date   HGBA1C 8.0 (H) 07/06/2016   HGBA1C 7.6 (H) 03/27/2016   HGBA1C 7.8 (H) 12/26/2015   Lab Results  Component Value Date   MICROALBUR 1.6 10/27/2014   LDLCALC 72 12/26/2015   CREATININE 1.30 07/06/2016      Allergies as of 07/11/2016   No Known Allergies     Medication List       Accurate as of 07/11/16 10:13 AM. Always use your most recent med list.          acyclovir 400 MG tablet Commonly known as:  ZOVIRAX   AMPYRA 10 MG Tb12 Generic drug:  dalfampridine 10 mg.   AUBAGIO 14 MG Tabs Generic drug:  Teriflunomide Reported on 10/14/2015   glucose blood test strip Commonly known as:  BAYER CONTOUR NEXT TEST USE AS INSTRUCTED TO CHECK BLOOD SUGARS 8 TIMES PER DAY DX CODE E10.65   insulin aspart 100 UNIT/ML injection Commonly known as:  NOVOLOG INJECT 80 UNITS INTO THE SKIN ONCE.   levothyroxine 125 MCG tablet Commonly known as:  SYNTHROID Take 1 tablet (  125 mcg total) by mouth daily before breakfast.   lisinopril 10 MG tablet Commonly known as:  PRINIVIL,ZESTRIL TAKE 1 TABLET BY MOUTH EVERY DAY   simvastatin 20 MG tablet Commonly known as:  ZOCOR TAKE 1 TABLET BY MOUTH AT BEDTIME       Allergies: No Known Allergies  Past Medical History:  Diagnosis Date  . Hyperlipidemia   . Hypertension   . Hypothyroidism   . Multiple sclerosis ([place])   . 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 Type 1 diabetes mellitus (HCC)     No past surgical history on file.  Family History  Problem Relation Age of Onset  . Diabetes Sister   . Colon cancer Maternal Grandmother   . Colon polyps Maternal Uncle     Social History:  reports that he has never smoked. He has never used smokeless tobacco. He reports that he does not  drink alcohol or use drugs.    Review of Systems      HYPERCHOLESTEROLEMIA: He has been on and off pravastatin and more recently has been taking simvastatin 20 mg  Liver functions are normal consistently  LDL much better in July 2017   Lab Results  Component Value Date   CHOL 124 12/26/2015   HDL 41.20 12/26/2015   LDLCALC 72 12/26/2015   LDLDIRECT 139.7 04/09/2014   TRIG 54.0 12/26/2015   CHOLHDL 3 12/26/2015   Lab Results  Component Value Date   ALT 34 07/06/2016    HYPERTENSION:  has  been on lisinopril with  good control  He has MULTIPLE sclerosis, Has been monitored frequently   HYPOTHYROIDISM: Currently on 88 g  He has been on usually stable dose of levothyroxine However over the last couple of months his TSH level has been significantly abnormal as tested by his neurologist He does not think he had difficulty with his thyroid functions with the same treatment last year  He thinks he is feeling fairly well without unusual fatigue even though his TSH was 53 last month He was feeling more tired when TSH previously was in the hyperthyroid range and his dose was reduced to half a tablet   Lab Results  Component Value Date   TSH 38.18 (H) 07/06/2016   TSH 1.88 12/26/2015   TSH 5.00 (H) 10/10/2015   FREET4 0.90 08/03/2015   FREET4 0.79 10/27/2014        Objective:   Physical Exam  BP 128/80   Pulse 84   Ht 6\' 1"  (1.854 m)   Wt 223 lb (101.2 kg)   SpO2 99%   BMI 29.42 kg/m       No enlargement or tenderness of the thyroid Biceps reflexes are difficult to elicit No tremor Skin not unusually cold, no dryness No peripheral edema   Assessment:      DIABETES type I on insulin pump:  See history of present illness for analysis of his current blood sugar patterns and problems  His blood sugars are overall about the same as the last 10 but his A1c is higher at 8% May be getting less hyperglycemia recently He is still checking blood sugars 4 times  a day but does not always have a consistent pattern as before  HIGHEST blood sugars are in the early afternoon Lowest blood sugars are around 9-10 PM, usually with correction for high readings previously at suppertime Fasting readings are reasonably well controlled also He has been previously reminded to try and accurately enter carbohydrates for his meals and  snacks  HYPOTHYROIDISM: Appears to have had an episode of silent thyroiditis with transient hyperthyroidism and now significant hypothyroidism TSH is still high at 15 although he is asymptomatic He and his family had several questions about the thyroid and this was discussed  HYPERTENSION: His blood pressure is well controlled      Plan:       Increase basal rate at 12 noon up to 1.4 Carbohydrate coverage 1:8 at lunchtime Discussed in detail the use of the freestyle Libre sensor and he will try to get this from his pump supply company  Increase levothyroxine up to 125 g TSH is significantly high Follow-up in 6 weeks   There are no Patient Instructions on file for this visit.  Counseling time on subjects discussed above is over 50% of today's 25 minute visit    Yousaf Sainato

## 2016-07-12 ENCOUNTER — Telehealth: Payer: Self-pay

## 2016-07-12 NOTE — Telephone Encounter (Signed)
Received CMN form and gave it to Dr. Dwyane Dee to fill out his portion

## 2016-07-18 NOTE — Telephone Encounter (Signed)
Have we received the paperwork for the patient's libre testing 4 times a day from [place]

## 2016-07-19 ENCOUNTER — Telehealth: Payer: Self-pay | Admitting: Endocrinology

## 2016-07-19 NOTE — Telephone Encounter (Signed)
Called the pt letting him know the information from [facility] has been sent and confirmed receipt back to them via fax

## 2016-07-26 NOTE — Telephone Encounter (Signed)
We can only write prescription for more days documented on his blood sugar download.   He was supposed to get the freestyle [place] sensor from [place] which will eliminate fingersticks.  The form was sent for the freestyle already

## 2016-07-26 NOTE — Telephone Encounter (Signed)
Pt is letting us know that we have to start writing the test strip rx for him to test 10 times a day in order for him to get the correct amount of test strips to cover him for the full month  Please let us know if this is ok so the pt will start to get 300 test strips per month  This is due to the new regulations of the pt's insurance and the pharmacy

## 2016-07-27 NOTE — Telephone Encounter (Signed)
The [facility] paperwork has been sent in

## 2016-07-27 NOTE — Telephone Encounter (Signed)
LM for pt to return call regarding note below

## 2016-08-14 ENCOUNTER — Telehealth: Payer: Self-pay | Admitting: Endocrinology

## 2016-08-14 ENCOUNTER — Other Ambulatory Visit: Payer: Self-pay | Admitting: Endocrinology

## 2016-08-14 DIAGNOSIS — E1065 Type 1 diabetes mellitus with hyperglycemia: Secondary | ICD-10-CM

## 2016-08-14 NOTE — Telephone Encounter (Signed)
TSH from 08/01/16 is 6.5, nearly normal Please let him know that since this is only 3 weeks after his dosage change will not increase the dose as yet

## 2016-08-16 NOTE — Telephone Encounter (Signed)
Patient stating you already went up on  his levothyroxine and Dr Doyle Askew in [place] does his blood work and faxed them to [place] so he does not understand why he has to come back and do blood work again and see you again next week if you already have the results- please advise

## 2016-08-16 NOTE — Telephone Encounter (Signed)
Pt is asking whether he is supposed to still be seen next week. I let the pt know that the lab results were from 3/7 so they are not recent and that Dr. Dwyane Dee wanted a later reading but pt is struggling to understand, can you please help me explain it to him?

## 2016-08-16 NOTE — Telephone Encounter (Signed)
He can wait another 3 weeks for lab work and follow-up, also needs to be seen for his diabetes when he comes back

## 2016-08-20 ENCOUNTER — Other Ambulatory Visit: Payer: Medicare Other

## 2016-08-20 DIAGNOSIS — M62838 Other muscle spasm: Secondary | ICD-10-CM | POA: Diagnosis not present

## 2016-08-20 DIAGNOSIS — Z79899 Other long term (current) drug therapy: Secondary | ICD-10-CM | POA: Diagnosis not present

## 2016-08-20 DIAGNOSIS — R5383 Other fatigue: Secondary | ICD-10-CM | POA: Diagnosis not present

## 2016-08-20 DIAGNOSIS — Z5181 Encounter for therapeutic drug level monitoring: Secondary | ICD-10-CM | POA: Diagnosis not present

## 2016-08-20 DIAGNOSIS — G35 Multiple sclerosis: Secondary | ICD-10-CM | POA: Diagnosis not present

## 2016-08-21 ENCOUNTER — Other Ambulatory Visit: Payer: Medicare Other

## 2016-08-22 ENCOUNTER — Ambulatory Visit: Payer: Medicare Other | Admitting: Endocrinology

## 2016-08-23 ENCOUNTER — Ambulatory Visit: Payer: Medicare Other | Admitting: Endocrinology

## 2016-08-25 ENCOUNTER — Other Ambulatory Visit: Payer: Self-pay | Admitting: Endocrinology

## 2016-08-27 NOTE — Telephone Encounter (Signed)
LM for pt to return call to see if this has already been discussed.

## 2016-08-27 NOTE — Telephone Encounter (Signed)
This was discussed with the pt last week and the appts have already been moved.

## 2016-08-27 NOTE — Telephone Encounter (Signed)
Patient needs to be called for this

## 2016-08-30 ENCOUNTER — Telehealth: Payer: Self-pay | Admitting: Endocrinology

## 2016-08-30 NOTE — Telephone Encounter (Signed)
The prescription on his medication list is for 280 strips with refills, please discuss with him what the problem is

## 2016-08-30 NOTE — Telephone Encounter (Signed)
Patient need a refill of  glucose blood (BAYER CONTOUR NEXT TEST) test strip 280 each   He need a prescription written for 280 strips to last him for the month.  He keep coming up 80 strips short and not having enough to last him.  He stated that he has been going through this for about three months, and ready to find another doctor.  So Dr Dwyane Dee I am sending this to you to take care of because this patient is having so many problems and he need your help.  Thank you Dr Dwyane Dee

## 2016-08-31 ENCOUNTER — Other Ambulatory Visit: Payer: Self-pay

## 2016-08-31 MED ORDER — GLUCOSE BLOOD VI STRP: ORAL_STRIP | 3 refills | Status: DC

## 2016-08-31 NOTE — Telephone Encounter (Signed)
Changed the prescription to 300 strips and spoke with the pharmacy- also left a vm explaining this for the patient

## 2016-09-07 ENCOUNTER — Other Ambulatory Visit (INDEPENDENT_AMBULATORY_CARE_PROVIDER_SITE_OTHER): Payer: Medicare Other

## 2016-09-07 DIAGNOSIS — E1065 Type 1 diabetes mellitus with hyperglycemia: Secondary | ICD-10-CM | POA: Diagnosis not present

## 2016-09-07 LAB — COMPREHENSIVE METABOLIC PANEL
ALT: 27 U/L (ref 0–53)
AST: 25 U/L (ref 0–37)
Albumin: 4.4 g/dL (ref 3.5–5.2)
Alkaline Phosphatase: 90 U/L (ref 39–117)
BUN: 15 mg/dL (ref 6–23)
CO2: 29 mEq/L (ref 19–32)
Calcium: 9.5 mg/dL (ref 8.4–10.5)
Chloride: 103 mEq/L (ref 96–112)
Creatinine, Ser: 1.26 mg/dL (ref 0.40–1.50)
GFR: 79.92 mL/min (ref >60.00)
Glucose, Bld: 130 mg/dL — ABNORMAL HIGH (ref 70–99)
Potassium: 4.2 mEq/L (ref 3.5–5.1)
Sodium: 138 mEq/L (ref 135–145)
Total Bilirubin: 0.5 mg/dL (ref 0.2–1.2)
Total Protein: 7.1 g/dL (ref 6.0–8.3)

## 2016-09-07 LAB — LIPID PANEL
Cholesterol: 116 mg/dL (ref 0–200)
HDL: 38.7 mg/dL — ABNORMAL LOW (ref >39.00)
LDL Cholesterol: 69 mg/dL (ref 0–99)
NonHDL: 77.46
Total CHOL/HDL Ratio: 3
Triglycerides: 42 mg/dL (ref 0.0–149.0)
VLDL: 8.4 mg/dL (ref 0.0–40.0)

## 2016-09-11 ENCOUNTER — Ambulatory Visit (INDEPENDENT_AMBULATORY_CARE_PROVIDER_SITE_OTHER): Payer: Medicare Other | Admitting: Endocrinology

## 2016-09-11 ENCOUNTER — Encounter: Payer: Self-pay | Admitting: Endocrinology

## 2016-09-11 VITALS — BP 138/80 | HR 89 | Ht 73.0 in | Wt 219.0 lb

## 2016-09-11 DIAGNOSIS — E7800 Pure hypercholesterolemia, unspecified: Secondary | ICD-10-CM

## 2016-09-11 DIAGNOSIS — E063 Autoimmune thyroiditis: Secondary | ICD-10-CM

## 2016-09-11 DIAGNOSIS — I10 Essential (primary) hypertension: Secondary | ICD-10-CM

## 2016-09-11 DIAGNOSIS — E1065 Type 1 diabetes mellitus with hyperglycemia: Secondary | ICD-10-CM

## 2016-09-11 NOTE — Patient Instructions (Addendum)
Add extra tablet of thyroid pill every Wednesday  Reduce basal rate at 4 AM down to 1.25 and at 6 AM down to 1.50, also 9 AM basal rate is 1.35 2 pm basal is 1.55

## 2016-09-11 NOTE — Progress Notes (Signed)
Subjective:              Patient ID: Johnathan Ross, male   DOB: 23-May-1971, 46 y.o.   MRN: [id number]  Diabetes    Diagnosis: Type 1 diabetes mellitus, date of diagnosis: 1995.   Insulin Pump followup:   CURRENT brand:  Medtronic 723 REVEL  HISTORY: An insulin pump has been in use since 03/13/11.   PUMP settings are basal rate:   1.5 until 4 a.m; 4 AM = 1.35. 6 a.m.= 1.7. 9 AM = 1.4,11 a.m. = 0.9. 12 noon = 1.2, 2 p.m. = 1. 45. 3 PM = 2.15 and 6 p.m. = 1.8  Carbohydrate to insulin ratio: 1: 10, at 11 a.m. = 1: 10, after 6 p.m. = 1:7, hyperglycemia correction factor: 1: 20,and after 10 p.m. 1:30, target 120, active insulin time 4 hours.  Changing infusion set every 3-4 days  Recent history:  His A1c has gone up to 8%In February, previously 7.6  For the last month or so he has been using the freestyle [place] sensor, he finds this relatively accurate and is using this more often than his fingersticks for entering his blood sugars in the pump except when his blood sugars are in the low range  Current blood sugar patterns Evaluated from both pump download and continues glucose sensor download, management and problems identified:  AVERAGE blood sugar from his CGMS 142 with 50% reading about target, 13% below and within target 37%  He does have significant variability with standard deviation 63% of the time   FASTING blood sugars on waking up are generally low normal or low with OVERNIGHT drop in blood sugar occurring between midnight and about 8 AM  His average blood sugar around 8 AM is about 100  Other patterns during the day show significant increase in blood sugar between 9-10 AM rising more gradually late morning and showing a plateau between about 12 noon-6 PM with average blood sugar between 160-180  Blood sugars in the evenings are relatively stable with some tendency to higher readings later at night overall  POSTPRANDIAL readings are difficult to assess because of his  variable meal times  However he will sometimes have a rising blood sugar after lunch with blood sugars around 200, occasionally this may be a rebound from low normal sugar late morning  HYPOGLYCEMIA is occurring usually early morning between 2 AM-9 AM especially around 6-7 AM  Only occasionally has blood sugar being low around 10 PM-11 PM  He has not changed his activity level  He is mostly bolusing for his evening meals with daily bolus about 41% of his insulin requirement and 86% of the boluses are with correction  He thinks he can detect hypoglycemia symptoms fairly regularly Treatment of hypoglycemia: He is usually drinking juice for this  GLUCOSE monitoring results:   Mean values apply above for all meters except median for One Touch  PRE-MEAL Fasting Lunch Dinner Bedtime Overall  Glucose range:  48-1 71       Mean/median: 91 177  160  143  142    POST-MEAL PC Breakfast PC Lunch PC Dinner  Glucose range:     Mean/median:        AVERAGE overall 169+/-76 Fasting average 151 Lunchtime average 167, suppertime average 182 and bedtime 167   EXERCISE: no programmed activity, somewhat more active in the afternoon   Wt Readings from Last 3 Encounters:  09/11/16 219 lb (99.3 kg)  07/11/16 223 lb (101.2 kg)  03/28/16 219 lb 3.2 oz (99.4 kg)     Lab Results  Component Value Date   HGBA1C 8.0 (H) 07/06/2016   HGBA1C 7.6 (H) 03/27/2016   HGBA1C 7.8 (H) 12/26/2015   Lab Results  Component Value Date   MICROALBUR 1.6 10/27/2014   LDLCALC 69 09/07/2016   CREATININE 1.26 09/07/2016      Allergies as of 09/11/2016   No Known Allergies     Medication List       Accurate as of 09/11/16 12:53 PM. Always use your most recent med list.          acyclovir 400 MG tablet Commonly known as:  ZOVIRAX   AMPYRA 10 MG Tb12 Generic drug:  dalfampridine 10 mg.   AUBAGIO 14 MG Tabs Generic drug:  Teriflunomide Reported on 10/14/2015   glucose blood test strip Commonly  known as:  BAYER CONTOUR NEXT TEST USE AS INSTRUCTED TO CHECK BLOOD SUGARS 8 TIMES PER DAY DX CODE E10.65   insulin aspart 100 UNIT/ML injection Commonly known as:  NOVOLOG INJECT 80 UNITS INTO THE SKIN ONCE.   levothyroxine 125 MCG tablet Commonly known as:  SYNTHROID Take 1 tablet (125 mcg total) by mouth daily before breakfast.   lisinopril 10 MG tablet Commonly known as:  PRINIVIL,ZESTRIL TAKE 1 TABLET BY MOUTH EVERY DAY   simvastatin 20 MG tablet Commonly known as:  ZOCOR TAKE 1 TABLET BY MOUTH AT BEDTIME       Allergies: No Known Allergies  Past Medical History:  Diagnosis Date  . Hyperlipidemia   . Hypertension   . Hypothyroidism   . Multiple sclerosis ([place])   . 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 Type 1 diabetes mellitus (HCC)     No past surgical history on file.  Family History  Problem Relation Age of Onset  . Diabetes Sister   . Colon cancer Maternal Grandmother   . Colon polyps Maternal Uncle     Social History:  reports that he has never smoked. He has never used smokeless tobacco. He reports that he does not drink alcohol or use drugs.    Review of Systems      HYPERCHOLESTEROLEMIA: He has been on and off pravastatin and more recently has been taking simvastatin 20 mg  Liver functions are normal consistently  LDL much better in July 2017   Lab Results  Component Value Date   CHOL 116 09/07/2016   HDL 38.70 (L) 09/07/2016   LDLCALC 69 09/07/2016   LDLDIRECT 139.7 04/09/2014   TRIG 42.0 09/07/2016   CHOLHDL 3 09/07/2016   Lab Results  Component Value Date   ALT 27 09/07/2016    HYPERTENSION:  has  been on lisinopril with  good control  He has MULTIPLE sclerosis, Has been monitored frequently   HYPOTHYROIDISM: Currently on 125 g  He has been on usually stable dose of levothyroxine However he appeared to have gone through a period of silent thyroiditis He was feeling more tired when TSH previously was in the  hyperthyroid range but subsequently with even TSH is high as 38 he was asymptomatic  and his dose was reduced to half a tablet   Lab Results  Component Value Date   TSH 38.18 (H) 07/06/2016   TSH 1.88 12/26/2015   TSH 5.00 (H) 10/10/2015   FREET4 0.90 08/03/2015   FREET4 0.79 10/27/2014        Objective:   Physical Exam  BP 138/80   Pulse 89   Ht 6'  1" (1.854 m)   Wt 219 lb (99.3 kg)   BMI 28.89 kg/m          Assessment:      DIABETES type I on insulin pump:  See history of present illness for analysis of his current blood sugar patterns and problems  His blood sugars are Surprisingly much lower overnight which is unusual for him, has had readings as low as 48 Blood sugars maybe higher mid-day and afternoon possibly part of a rebound phenomenon but overall  blood sugars are better than the last time He is somewhat benefiting from the continuous glucose monitoring since he is able to keep up with his blood sugars both high and low more often now line HIGHEST blood sugars are in the early afternoon Difficult to assess postprandial readings as his meals are erratic and currently does not appear to have usually higher readings after his evening meal and only low blood sugar once after evening bolus   HYPOTHYROIDISM:  Although he had a possible episode of silent thyroiditis with transient hyperthyroidism and with subsequent more severe hypothyroidism he still tends to have a high and variable TSH TSH is still high at about 12 although he is asymptomatic as before  HYPERTENSION: His blood pressure is well controlled      Plan:      He will need to reduce his basal rates starting from 4 AM as follows:  Reduce basal rate at 4 AM down to 1.25 and at 6 AM down to 1.50, also 9 AM basal rate is 1.35  Increase 2 pm basal up to 1.55  Advised him to call if he is having continued problems with hypoglycemia Encouraged him to check his blood sugar periodically with a  fingerstick to correlate with a FreeStyle Also taking boluses to correct high readings as needed  Increase levothyroxine up to 8 tablets of 125 g TSH weekly  Follow-up in 6 weeks   Patient Instructions  Add extra tablet of thyroid pill every Wednesday  Reduce basal rate at 4 AM down to 1.25 and at 6 AM down to 1.50, also 9 AM basal rate is 1.35 2 pm basal is 1.55   Counseling time on subjects discussed above is over 50% of today's 25 minute visit    Sybilla Malhotra

## 2016-09-13 ENCOUNTER — Other Ambulatory Visit: Payer: Self-pay | Admitting: Endocrinology

## 2016-09-20 NOTE — Telephone Encounter (Signed)
Patient has not told me about any ENT problems

## 2016-09-20 NOTE — Telephone Encounter (Signed)
ENT referral is needed as discussed please

## 2016-09-20 NOTE — Telephone Encounter (Signed)
LM for pt to returncall

## 2016-09-24 ENCOUNTER — Other Ambulatory Visit: Payer: Self-pay | Admitting: Endocrinology

## 2016-09-24 DIAGNOSIS — K146 Glossodynia: Secondary | ICD-10-CM

## 2016-09-24 MED ORDER — MAGIC MOUTHWASH: ORAL | 0 refills | Status: DC

## 2016-09-24 NOTE — Telephone Encounter (Signed)
Patient stated that the pharmacy haven't receive prescription for the magic mouthwash SOLN. Please advise

## 2016-09-24 NOTE — Telephone Encounter (Signed)
I contacted Johnathan Ross and she stated the pharmacy needed to know the specific content of the magic mouth wash. She stated the mouth was does not come standard and MD would need to specify what should be mix with the mouth wash. She stated the two most common were liquid benadryl and hydrocortisone and nystatin. Please advise, Thanks!

## 2016-09-24 NOTE — Telephone Encounter (Signed)
Pharmacy needs to know the specific amount of each to combine. There is not a standard dosage.

## 2016-09-24 NOTE — Telephone Encounter (Signed)
They can put the hydrocortisone and nystatin in a standard formulation

## 2016-09-24 NOTE — Telephone Encounter (Signed)
Pt is also having issues with the test strips again he needs 300 and his pharmacy is only giving him 200 contrary to the rx asking for a 300 quantity, Lattie Haw did speak with [facility] a few weeks ago to straighten this out but apparently the pharmacy still has issues

## 2016-09-24 NOTE — Telephone Encounter (Signed)
Will send ENT referral but in the meantime he can try Magic mouthwash 200 ML, 1 tablespoon 3 times a day to swish and discard

## 2016-09-24 NOTE — Telephone Encounter (Signed)
Please call the pt with this information as soon as possible, mouthwash has been called in. ENT referral no longer needed but now has made a Oral surgery referral  Dr. Dwyane Dee,  Why is the referral an oral surgery referral now? Pt will ask.

## 2016-09-24 NOTE — Telephone Encounter (Signed)
Rx resubmitted

## 2016-09-24 NOTE — Telephone Encounter (Signed)
PT states that he showed MD his tongue, there were spots on it and it was implied that ENT referral was needed

## 2016-09-24 NOTE — Telephone Encounter (Signed)
See message and please advise, Thanks!  

## 2016-09-24 NOTE — Telephone Encounter (Signed)
I contacted the patient and advised of message. He voiced understanding and had no further questions. Magic Mouth wash submitted to [facility] [address]

## 2016-09-24 NOTE — Telephone Encounter (Signed)
[facility] from [facility] called need clarlification on patient medication magic mouth wash. [phone]

## 2016-09-24 NOTE — Telephone Encounter (Signed)
Oral surgeons will deal better with issues with the tongue

## 2016-09-25 NOTE — Telephone Encounter (Signed)
Patient notified Dr. Dwyane Dee has given the order needed for the magic mouth wash. The solution will consistent of 60 mg of hydrocortisone and 240 ml of nystatin. I also advised the patient I had contacted the pharmacy and gave the verbal order to [facility] to dispense 300 test strips. She voiced understanding and stated she would get the test strips ready for the patient to pick up.

## 2016-09-25 NOTE — Telephone Encounter (Signed)
How will we be proceeding with the magic mouth wash?

## 2016-10-05 ENCOUNTER — Other Ambulatory Visit: Payer: Self-pay | Admitting: Endocrinology

## 2016-10-29 ENCOUNTER — Ambulatory Visit (INDEPENDENT_AMBULATORY_CARE_PROVIDER_SITE_OTHER): Payer: Medicare Other

## 2016-10-29 ENCOUNTER — Ambulatory Visit (HOSPITAL_COMMUNITY)
Admission: EM | Admit: 2016-10-29 | Discharge: 2016-10-29 | Disposition: A | Discharge status: Nursing Home (Includes ICF) | Payer: Medicare Other | Attending: Emergency Medicine | Admitting: Emergency Medicine

## 2016-10-29 ENCOUNTER — Inpatient Hospital Stay (HOSPITAL_COMMUNITY)
Admission: EM | Admit: 2016-10-29 | Discharge: 2016-11-01 | DRG: 493 | Disposition: A | Discharge status: Home / Self Care | Payer: Medicare Other | Attending: Orthopedic Surgery | Admitting: Orthopedic Surgery

## 2016-10-29 ENCOUNTER — Encounter (HOSPITAL_COMMUNITY): Payer: Self-pay | Admitting: Emergency Medicine

## 2016-10-29 ENCOUNTER — Encounter (HOSPITAL_COMMUNITY): Payer: Self-pay

## 2016-10-29 DIAGNOSIS — E039 Hypothyroidism, unspecified: Secondary | ICD-10-CM | POA: Diagnosis present

## 2016-10-29 DIAGNOSIS — E103599 Type 1 diabetes mellitus with proliferative diabetic retinopathy without macular edema, unspecified eye: Secondary | ICD-10-CM | POA: Diagnosis present

## 2016-10-29 DIAGNOSIS — S82839A Other fracture of upper and lower end of unspecified fibula, initial encounter for closed fracture: Secondary | ICD-10-CM

## 2016-10-29 DIAGNOSIS — E8889 Other specified metabolic disorders: Secondary | ICD-10-CM | POA: Diagnosis present

## 2016-10-29 DIAGNOSIS — W19XXXA Unspecified fall, initial encounter: Secondary | ICD-10-CM

## 2016-10-29 DIAGNOSIS — G35 Multiple sclerosis: Secondary | ICD-10-CM | POA: Diagnosis present

## 2016-10-29 DIAGNOSIS — Z9641 Presence of insulin pump (external) (internal): Secondary | ICD-10-CM

## 2016-10-29 DIAGNOSIS — S82871A Displaced pilon fracture of right tibia, initial encounter for closed fracture: Secondary | ICD-10-CM | POA: Diagnosis present

## 2016-10-29 DIAGNOSIS — E109 Type 1 diabetes mellitus without complications: Secondary | ICD-10-CM | POA: Diagnosis present

## 2016-10-29 DIAGNOSIS — S82401A Unspecified fracture of shaft of right fibula, initial encounter for closed fracture: Secondary | ICD-10-CM

## 2016-10-29 DIAGNOSIS — Y92008 Other place in unspecified non-institutional (private) residence as the place of occurrence of the external cause: Secondary | ICD-10-CM

## 2016-10-29 DIAGNOSIS — S82831A Other fracture of upper and lower end of right fibula, initial encounter for closed fracture: Secondary | ICD-10-CM | POA: Diagnosis not present

## 2016-10-29 DIAGNOSIS — T148XXA Other injury of unspecified body region, initial encounter: Secondary | ICD-10-CM

## 2016-10-29 DIAGNOSIS — S82201A Unspecified fracture of shaft of right tibia, initial encounter for closed fracture: Secondary | ICD-10-CM | POA: Diagnosis present

## 2016-10-29 DIAGNOSIS — S82843A Displaced bimalleolar fracture of unspecified lower leg, initial encounter for closed fracture: Secondary | ICD-10-CM

## 2016-10-29 DIAGNOSIS — E1042 Type 1 diabetes mellitus with diabetic polyneuropathy: Secondary | ICD-10-CM | POA: Diagnosis present

## 2016-10-29 DIAGNOSIS — E108 Type 1 diabetes mellitus with unspecified complications: Secondary | ICD-10-CM

## 2016-10-29 DIAGNOSIS — Z794 Long term (current) use of insulin: Secondary | ICD-10-CM

## 2016-10-29 DIAGNOSIS — S82209A Unspecified fracture of shaft of unspecified tibia, initial encounter for closed fracture: Secondary | ICD-10-CM | POA: Diagnosis present

## 2016-10-29 DIAGNOSIS — D62 Acute posthemorrhagic anemia: Secondary | ICD-10-CM | POA: Diagnosis not present

## 2016-10-29 DIAGNOSIS — I10 Essential (primary) hypertension: Secondary | ICD-10-CM | POA: Diagnosis not present

## 2016-10-29 DIAGNOSIS — E559 Vitamin D deficiency, unspecified: Secondary | ICD-10-CM

## 2016-10-29 DIAGNOSIS — E230 Hypopituitarism: Secondary | ICD-10-CM | POA: Diagnosis present

## 2016-10-29 DIAGNOSIS — S82832A Other fracture of upper and lower end of left fibula, initial encounter for closed fracture: Secondary | ICD-10-CM | POA: Diagnosis not present

## 2016-10-29 DIAGNOSIS — Z79899 Other long term (current) drug therapy: Secondary | ICD-10-CM

## 2016-10-29 DIAGNOSIS — S82434A Nondisplaced oblique fracture of shaft of right fibula, initial encounter for closed fracture: Secondary | ICD-10-CM | POA: Diagnosis not present

## 2016-10-29 DIAGNOSIS — W010XXA Fall on same level from slipping, tripping and stumbling without subsequent striking against object, initial encounter: Secondary | ICD-10-CM | POA: Diagnosis present

## 2016-10-29 DIAGNOSIS — E785 Hyperlipidemia, unspecified: Secondary | ICD-10-CM | POA: Diagnosis present

## 2016-10-29 DIAGNOSIS — S82241A Displaced spiral fracture of shaft of right tibia, initial encounter for closed fracture: Secondary | ICD-10-CM

## 2016-10-29 DIAGNOSIS — E291 Testicular hypofunction: Secondary | ICD-10-CM | POA: Diagnosis present

## 2016-10-29 DIAGNOSIS — S8251XA Displaced fracture of medial malleolus of right tibia, initial encounter for closed fracture: Secondary | ICD-10-CM | POA: Diagnosis not present

## 2016-10-29 DIAGNOSIS — Z419 Encounter for procedure for purposes other than remedying health state, unspecified: Secondary | ICD-10-CM

## 2016-10-29 HISTORY — DX: Testicular hypofunction: E29.1

## 2016-10-29 HISTORY — DX: Vitamin D deficiency, unspecified: E55.9

## 2016-10-29 LAB — BASIC METABOLIC PANEL
Anion gap: 10 (ref 5–15)
BUN: 13 mg/dL (ref 6–20)
CO2: 24 mmol/L (ref 22–32)
Calcium: 9.5 mg/dL (ref 8.9–10.3)
Chloride: 101 mmol/L (ref 101–111)
Creatinine, Ser: 1.28 mg/dL — ABNORMAL HIGH (ref 0.61–1.24)
GFR calc Af Amer: >60 mL/min (ref >60)
GFR calc non Af Amer: >60 mL/min (ref >60)
Glucose, Bld: 214 mg/dL — ABNORMAL HIGH (ref 65–99)
Potassium: 4.8 mmol/L (ref 3.5–5.1)
Sodium: 135 mmol/L (ref 135–145)

## 2016-10-29 LAB — CBC
HCT: 43.4 % (ref 39.0–52.0)
Hemoglobin: 14.6 g/dL (ref 13.0–17.0)
MCH: 29.3 pg (ref 26.0–34.0)
MCHC: 33.6 g/dL (ref 30.0–36.0)
MCV: 87 fL (ref 78.0–100.0)
Platelets: 274 10*3/uL (ref 150–400)
RBC: 4.99 MIL/uL (ref 4.22–5.81)
RDW: 13.3 % (ref 11.5–15.5)
WBC: 15 10*3/uL — ABNORMAL HIGH (ref 4.0–10.5)

## 2016-10-29 LAB — TYPE AND SCREEN
ABO/RH(D): A POS
Antibody Screen: NEGATIVE

## 2016-10-29 LAB — CBG MONITORING, ED: Glucose-Capillary: 184 mg/dL — ABNORMAL HIGH (ref 65–99)

## 2016-10-29 LAB — ABO/RH: ABO/RH(D): A POS

## 2016-10-29 LAB — GLUCOSE, CAPILLARY: Glucose-Capillary: 194 mg/dL — ABNORMAL HIGH (ref 65–99)

## 2016-10-29 MED ORDER — FENTANYL CITRATE (PF) 100 MCG/2ML IJ SOLN
50.0000 ug | INTRAMUSCULAR | Status: AC | PRN
  Administered 2016-10-29 – 2016-10-30 (×3): 50 ug via INTRAVENOUS
  Filled 2016-10-29 (×3): qty 2

## 2016-10-29 NOTE — ED Notes (Signed)
Report attempted 

## 2016-10-29 NOTE — H&P (Addendum)
ORTHOPAEDIC H&P  REQUESTING PHYSICIAN: Rod Can, MD  PCP:  Elayne Snare, MD  Chief Complaint: Right tibial plafond fracture  HPI: Johnathan Ross is a 46 y.o. male who complains of slipping and falling on his gravel driveway about 4 PM this afternoon. He ate just prior to the injury. He is a type I diabetic with an insulin pump. He noted immediate right ankle pain and inability to weight-bear. He was transported to [facility] urgent care, where x-rays revealed a right tibial plafond fracture. Orthopedic consultation was obtained, and I recommended transfer to the emergency department for evaluation and admission. He denies other injuries.  Past Medical History:  Diagnosis Date  . Hyperlipidemia   . Hypertension   . Hypothyroidism   . Multiple sclerosis ([place])   . 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 Type 1 diabetes mellitus ([place])    History reviewed. No pertinent surgical history. Social History   Social History  . Marital status: Single    Spouse name: N/A  . Number of children: N/A  . Years of education: N/A   Social History Main Topics  . Smoking status: Never Smoker  . Smokeless tobacco: Never Used  . Alcohol use No  . Drug use: No  . Sexual activity: Not Asked   Other Topics Concern  . None   Social History Narrative   No caffeine    Family History  Problem Relation Age of Onset  . Diabetes Sister   . Colon cancer Maternal Grandmother   . Colon polyps Maternal Uncle    No Known Allergies Prior to Admission medications   Medication Sig Start Date End Date Taking? Authorizing Provider  acyclovir (ZOVIRAX) 400 MG tablet  10/31/14   [provider]  AMPYRA 10 MG TB12 10 mg. 01/29/13   [provider]  AUBAGIO 14 MG TABS Reported on 10/14/2015 07/02/14   [provider]  glucose blood (BAYER CONTOUR NEXT TEST) test strip USE AS INSTRUCTED TO CHECK BLOOD SUGARS 8 TIMES PER DAY DX CODE E10.65 08/31/16   Elayne Snare, MD    levothyroxine (SYNTHROID) 125 MCG tablet Take 1 tablet (125 mcg total) by mouth daily before breakfast. 07/11/16   Elayne Snare, MD  lisinopril (PRINIVIL,ZESTRIL) 10 MG tablet TAKE 1 TABLET BY MOUTH EVERY DAY 08/27/16   Elayne Snare, MD  magic mouthwash SOLN 1 tablespoon 3 times a day to swish and discard. 09/24/16   Elayne Snare, MD  NOVOLOG 100 UNIT/ML injection INJECT 80 UNITS INTO THE SKIN ONCE. 10/08/16   Elayne Snare, MD  simvastatin (ZOCOR) 20 MG tablet TAKE 1 TABLET BY MOUTH AT BEDTIME 09/13/16   Elayne Snare, MD   Dg Tibia/fibula Right  Result Date: 10/29/2016 CLINICAL DATA:  At post fall. EXAM: RIGHT TIBIA AND FIBULA - 2 VIEW COMPARISON:  None. FINDINGS: Spiral fracture the distal tibial metaphysis extending to the medial apophysis and the articular surface. 4 mm of lateral displacement. Oblique fracture of the distal fibular metaphysis with 6 mm of posterior displacement. Nondisplaced oblique fracture of the proximal fibular metadiaphysis. No lytic or sclerotic osseous lesion. Soft tissue swelling over the medial and lateral malleolus. IMPRESSION: 1. Spiral fracture the distal tibial metaphysis extending to the medial apophysis and the articular surface. 4 mm of lateral displacement. 2. Oblique fracture of the distal fibular metaphysis with 6 mm of posterior displacement. 3. Nondisplaced oblique fracture of the proximal fibular metadiaphysis. Electronically Signed   By: Kathreen Devoid   On: 10/29/2016 20:09  Dg Ankle Complete Right  Result Date: 10/29/2016 CLINICAL DATA:  Pain after right ankle twisting injury. EXAM: RIGHT ANKLE - COMPLETE 3+ VIEW COMPARISON:  None. FINDINGS: Acute, closed intra-articular fractures of the distal tibial diaphysis and metaphysis extending into the ankle mortise just medial to the medial malleolus is noted. There is slight lateral displacement of the distal fracture fragment approximately 5 mm. Acute coronal oblique fracture of the distal fibular diaphysis is also noted with  lateral and slight dorsal displacement of the distal fracture fragment also extending into the ankle joint. No widening of the ankle mortise. The subtalar and midfoot articulations are maintained. Vascular calcifications are seen about the ankle. Soft tissue swelling is noted about the malleoli. Small ankle joint effusion. IMPRESSION: Acute, closed intra-articular fractures of the distal tibia and fibula extending into the ankle joint without mortise widening. Slight lateral and dorsal displacement of the distal fibular fracture fragment is noted. Slight lateral displacement of the distal tibial fracture fragment up to 5 mm is also seen. Electronically Signed   By: Ashley Royalty M.D.   On: 10/29/2016 19:02    Positive ROS: All other systems have been reviewed and were otherwise negative with the exception of those mentioned in the HPI and as above.  Physical Exam: General: Alert, no acute distress Cardiovascular: No pedal edema Respiratory: No cyanosis, no use of accessory musculature GI: No organomegaly, abdomen is soft and non-tender Skin: No lesions in the area of chief complaint Neurologic: Sensation intact distally Psychiatric: Patient is competent for consent with normal mood and affect Lymphatic: No axillary or cervical lymphadenopathy  MUSCULOSKELETAL:   BUE/LLE: No evidence of trauma. Full painless range of motion. Neurovascularly intact.  Examination of the right lower extremity reveals that he has a posterior splint with a U. I loosened the splint in order to evaluate his skin. No skin wounds or lesions. He has minimal swelling at this point. He does have positive skin wrinkling present. He reports intact sensation to light touch in the superficial peroneal, deep peroneal, posterior tibial distributions. He has 2+ pedal pulses. He is able to wiggle his toes.  Assessment: Right tibial plafond fracture. No evidence of impending compartment syndrome.  Plan: I discussed the findings  with the patient. He has a complicated intra-articular right distal tibia fracture. He will require admission to the hospital overnight for neurovascular checks. We will obtain a CT scan right ankle for preoperative planning. Nonweightbearing right lower extremity. Continue splint right lower extremity. Elevate toes above nose on blankets and pillows. Nothing by mouth after midnight. Hold chemical DVT prophylaxis. Plan will be to go to the operating room tomorrow with Dr. Marcelino Scot for external fixator placement versus definitive open reduction internal fixation.    Melonee Gerstel, Horald Pollen, MD Cell [phone]    10/29/2016 9:28 PM

## 2016-10-29 NOTE — ED Triage Notes (Signed)
PER CARELINK:  Pt sent from Urgent Care due to right tib/fib fracture secondary to falling on rocks today. Hx of type 1 DM and has insulin pump. BP-149/83, HR-93, 100% RA, 18RR.

## 2016-10-29 NOTE — ED Triage Notes (Signed)
The patient presented to the [facility] with a complaint of right ankle pain and swelling due to stepping wrong earlier today.

## 2016-10-29 NOTE — ED Notes (Signed)
Family at bedside. 

## 2016-10-29 NOTE — Progress Notes (Signed)
Received patient from ED accompanied by family members. Patient AOx4, VS stable, O2Sat at 100% on RA, pain at 3/10 with splint on RLE and has its own insulin pump.  Paged on-call [facility] MD/PA for the patient's admission, CBG check order and surgical PCR protoccol. [facility] PA responded and verbally agreed of the orders.

## 2016-10-29 NOTE — ED Provider Notes (Signed)
HPI  SUBJECTIVE:  Johnathan Ross is a 46 y.o. male who presents with mild right ankle pain after slipping and falling on some rocks. States that he inverted his ankle, rolling and outward. He was unable to bear weight immediately after. He denies feeling a pop. He reports mild dull, lateral pain. He reports initial numbness, which has since resolved. No tingling, weakness, swelling, bruising. This occurred 1 hour prior to arrival. There are no alleviating factors. He has not tried anything for this. Symptoms are worse with weightbearing. He has a past medical history of diabetes, MS, hypothyroidism, history of right ankle sprain. He has a history of left ankle fracture. PMD: Elayne Snare, MD  Past Medical History:  Diagnosis Date  . Hyperlipidemia   . Hypertension   . Hypothyroidism   . Multiple sclerosis ([place])   . 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 Type 1 diabetes mellitus ([place])     History reviewed. No pertinent surgical history.  Family History  Problem Relation Age of Onset  . Diabetes Sister   . Colon cancer Maternal Grandmother   . Colon polyps Maternal Uncle     Social History  Substance Use Topics  . Smoking status: Never Smoker  . Smokeless tobacco: Never Used  . Alcohol use No    No current facility-administered medications for this encounter.   Current Outpatient Prescriptions:  .  acyclovir (ZOVIRAX) 400 MG tablet, , Disp: , Rfl:  .  AMPYRA 10 MG TB12, 10 mg., Disp: , Rfl:  .  AUBAGIO 14 MG TABS, Reported on 10/14/2015, Disp: , Rfl:  .  glucose blood (BAYER CONTOUR NEXT TEST) test strip, USE AS INSTRUCTED TO CHECK BLOOD SUGARS 8 TIMES PER DAY DX CODE E10.65, Disp: 300 each, Rfl: 3 .  levothyroxine (SYNTHROID) 125 MCG tablet, Take 1 tablet (125 mcg total) by mouth daily before breakfast., Disp: 30 tablet, Rfl: 3 .  lisinopril (PRINIVIL,ZESTRIL) 10 MG tablet, TAKE 1 TABLET BY MOUTH EVERY DAY, Disp: 90 tablet, Rfl: 0 .  magic mouthwash SOLN, 1 tablespoon  3 times a day to swish and discard., Disp: 10 mL, Rfl: 0 .  NOVOLOG 100 UNIT/ML injection, INJECT 80 UNITS INTO THE SKIN ONCE., Disp: 60 mL, Rfl: 1 .  simvastatin (ZOCOR) 20 MG tablet, TAKE 1 TABLET BY MOUTH AT BEDTIME, Disp: 90 tablet, Rfl: 1  No Known Allergies   ROS  As noted in HPI.   Physical Exam  BP (!) 165/81 (BP Location: Left Arm)   Pulse 100   Temp 99 F (37.2 C) (Oral)   Resp 18   SpO2 100%   Constitutional: Well developed, well nourished, no acute distress Eyes:  EOMI, conjunctiva normal bilaterally HENT: Normocephalic, atraumatic,mucus membranes moist Respiratory: Normal inspiratory effort Cardiovascular: Normal rate GI: nondistended skin: No rash, skin intact Musculoskeletal: R Ankle Proximal fibula NT, Distal fibula tender, Medial malleolus  tender,  Deltoid ligament medially NT,  Lateral ligaments NT , ATFL laterally NT , posterior tablofibular ligament laterally NT, Achilles NT, calcaneus NT,  Proximal 5th metatarsal NT, Midfoot NT, distal NVI with baseline sensation / motor to foot with CR<2 seconds. Pain with inversion, eversion, No pain with dorsiflexion/plantar flexion. No bruising. Pt not able to bear weight in dept.  Neurologic: Alert & oriented x 3, no focal neuro deficits Psychiatric: Speech and behavior appropriate   ED Course   Medications - No data to display  Orders Placed This Encounter  Procedures  . DG Ankle Complete Right    Standing  Status:   Standing    Number of Occurrences:   1    Order Specific Question:   Reason for Exam (SYMPTOM  OR DIAGNOSIS REQUIRED)    Answer:   pain and swelling after stepping wrong today.  . DG Tibia/Fibula Right    Standing Status:   Standing    Number of Occurrences:   1    Order Specific Question:   Reason for Exam (SYMPTOM  OR DIAGNOSIS REQUIRED)    Answer:   r/o proximal fx  . Apply other splint    Standing Status:   Standing    Number of Occurrences:   1    Order Specific Question:   Laterality     Answer:   Right    Order Specific Question:   Splint type    Answer:   stirrup and posterior splint    No results found for this or any previous visit (from the past 24 hour(s)). Dg Tibia/fibula Right  Result Date: 10/29/2016 CLINICAL DATA:  At post fall. EXAM: RIGHT TIBIA AND FIBULA - 2 VIEW COMPARISON:  None. FINDINGS: Spiral fracture the distal tibial metaphysis extending to the medial apophysis and the articular surface. 4 mm of lateral displacement. Oblique fracture of the distal fibular metaphysis with 6 mm of posterior displacement. Nondisplaced oblique fracture of the proximal fibular metadiaphysis. No lytic or sclerotic osseous lesion. Soft tissue swelling over the medial and lateral malleolus. IMPRESSION: 1. Spiral fracture the distal tibial metaphysis extending to the medial apophysis and the articular surface. 4 mm of lateral displacement. 2. Oblique fracture of the distal fibular metaphysis with 6 mm of posterior displacement. 3. Nondisplaced oblique fracture of the proximal fibular metadiaphysis. Electronically Signed   By: Kathreen Devoid   On: 10/29/2016 20:09   Dg Ankle Complete Right  Result Date: 10/29/2016 CLINICAL DATA:  Pain after right ankle twisting injury. EXAM: RIGHT ANKLE - COMPLETE 3+ VIEW COMPARISON:  None. FINDINGS: Acute, closed intra-articular fractures of the distal tibial diaphysis and metaphysis extending into the ankle mortise just medial to the medial malleolus is noted. There is slight lateral displacement of the distal fracture fragment approximately 5 mm. Acute coronal oblique fracture of the distal fibular diaphysis is also noted with lateral and slight dorsal displacement of the distal fracture fragment also extending into the ankle joint. No widening of the ankle mortise. The subtalar and midfoot articulations are maintained. Vascular calcifications are seen about the ankle. Soft tissue swelling is noted about the malleoli. Small ankle joint effusion. IMPRESSION:  Acute, closed intra-articular fractures of the distal tibia and fibula extending into the ankle joint without mortise widening. Slight lateral and dorsal displacement of the distal fibular fracture fragment is noted. Slight lateral displacement of the distal tibial fracture fragment up to 5 mm is also seen. Electronically Signed   By: Megen Madewell Royalty M.D.   On: 10/29/2016 19:02    ED Clinical Impression  Closed bimalleolar fracture of ankle with proximal fibular fracture   ED Assessment/Plan  Reviewed imaging independently. Closed intra-articular fractures of the distal tibia and fibula extending into the ankle joint without mortise widening. Slight lateral and dorsal displacement of the distal fibular fracture fragments and slight lateral displacement of the distal tibial fracture fragment up to 5 mm. See radiology report for details.  Patient also has a nondisplaced proximal fibular fracture.  36- paged Dr. Delfino Lovett [facility] ortho on call [phone]- d/w ortho- Pt to go to the ED. placing in a stirrup and posterior splints  Pt to remain NPO until seen by Dr. Delfino Lovett .  Patient unable to bear weight at all on unaffected ankle and is completely immobile- We'll send via EMS. Instructed patient to be nothing by mouth until evaluated by orthopedics.  Discussed imaging, MDM, plan and followup with patient and family. Patient agrees with plan.   No orders of the defined types were placed in this encounter.   *This clinic note was created using Dragon dictation software. Therefore, there may be occasional mistakes despite careful proofreading.  ?   Melynda Ripple, MD 10/29/16 2019

## 2016-10-29 NOTE — ED Notes (Signed)
Report   Phoned  To  Charge  Nurse    Nurse  Shanon Brow

## 2016-10-29 NOTE — ED Provider Notes (Signed)
I saw and evaluated the patient, reviewed the resident's note and I agree with the findings and plan.  Pertinent History: has had a fall when on gravel - lost balance - had acute ankle injury with pain and swelling, non ambulatory [facility] to [facility]< they [facility]o - tx to ED  Pertinent Exam findings: swollen tender ankle, xray shows frx.  Goo pulses and sensation  D/w Dr. Rex Kras - will admit  I was personally present and directly supervised the following procedures:  None  I personally interpreted the EKG as well as the resident and agree with the interpretation on the resident's chart.  Final diagnoses:  Closed displaced spiral fracture of shaft of right tibia, initial encounter       Noemi Chapel, MD 10/30/16 1007

## 2016-10-29 NOTE — Progress Notes (Signed)
Orthopedic Tech Progress Note Patient Details:  Johnathan Ross 11-06-1970 [id number]  Ortho Devices Type of Ortho Device: Ace wrap, Stirrup splint, Post (short leg) splint Ortho Device/Splint Location: RLE Ortho Device/Splint Interventions: Ordered, Application   [name] 10/29/2016, 7:57 PM

## 2016-10-29 NOTE — Discharge Instructions (Signed)
Nothing to eat or drink until evaluated by Dr. Delfino Lovett.

## 2016-10-30 ENCOUNTER — Encounter (HOSPITAL_COMMUNITY): Payer: Self-pay | Admitting: Orthopedic Surgery

## 2016-10-30 ENCOUNTER — Observation Stay (HOSPITAL_COMMUNITY): Payer: Medicare Other | Admitting: Anesthesiology

## 2016-10-30 ENCOUNTER — Observation Stay (HOSPITAL_COMMUNITY): Payer: Medicare Other

## 2016-10-30 ENCOUNTER — Encounter (HOSPITAL_COMMUNITY)
Admission: EM | Disposition: A | Discharge status: Home / Self Care | Payer: Self-pay | Source: Home / Self Care | Attending: Orthopedic Surgery

## 2016-10-30 DIAGNOSIS — S82201A Unspecified fracture of shaft of right tibia, initial encounter for closed fracture: Secondary | ICD-10-CM | POA: Diagnosis not present

## 2016-10-30 DIAGNOSIS — E109 Type 1 diabetes mellitus without complications: Secondary | ICD-10-CM | POA: Diagnosis not present

## 2016-10-30 DIAGNOSIS — E8889 Other specified metabolic disorders: Secondary | ICD-10-CM | POA: Diagnosis not present

## 2016-10-30 DIAGNOSIS — S82231A Displaced oblique fracture of shaft of right tibia, initial encounter for closed fracture: Secondary | ICD-10-CM | POA: Diagnosis not present

## 2016-10-30 DIAGNOSIS — Y92008 Other place in unspecified non-institutional (private) residence as the place of occurrence of the external cause: Secondary | ICD-10-CM | POA: Diagnosis not present

## 2016-10-30 DIAGNOSIS — S82401A Unspecified fracture of shaft of right fibula, initial encounter for closed fracture: Secondary | ICD-10-CM | POA: Diagnosis not present

## 2016-10-30 DIAGNOSIS — G35 Multiple sclerosis: Secondary | ICD-10-CM | POA: Diagnosis not present

## 2016-10-30 DIAGNOSIS — E103599 Type 1 diabetes mellitus with proliferative diabetic retinopathy without macular edema, unspecified eye: Secondary | ICD-10-CM | POA: Diagnosis not present

## 2016-10-30 DIAGNOSIS — S82241A Displaced spiral fracture of shaft of right tibia, initial encounter for closed fracture: Secondary | ICD-10-CM | POA: Diagnosis not present

## 2016-10-30 DIAGNOSIS — Z79899 Other long term (current) drug therapy: Secondary | ICD-10-CM | POA: Diagnosis not present

## 2016-10-30 DIAGNOSIS — D62 Acute posthemorrhagic anemia: Secondary | ICD-10-CM | POA: Diagnosis not present

## 2016-10-30 DIAGNOSIS — S8251XA Displaced fracture of medial malleolus of right tibia, initial encounter for closed fracture: Secondary | ICD-10-CM | POA: Diagnosis not present

## 2016-10-30 DIAGNOSIS — S82899A Other fracture of unspecified lower leg, initial encounter for closed fracture: Secondary | ICD-10-CM | POA: Diagnosis not present

## 2016-10-30 DIAGNOSIS — S82871A Displaced pilon fracture of right tibia, initial encounter for closed fracture: Secondary | ICD-10-CM | POA: Diagnosis not present

## 2016-10-30 DIAGNOSIS — I10 Essential (primary) hypertension: Secondary | ICD-10-CM | POA: Diagnosis not present

## 2016-10-30 DIAGNOSIS — Z794 Long term (current) use of insulin: Secondary | ICD-10-CM | POA: Diagnosis not present

## 2016-10-30 DIAGNOSIS — S82831A Other fracture of upper and lower end of right fibula, initial encounter for closed fracture: Secondary | ICD-10-CM | POA: Diagnosis not present

## 2016-10-30 DIAGNOSIS — W010XXA Fall on same level from slipping, tripping and stumbling without subsequent striking against object, initial encounter: Secondary | ICD-10-CM | POA: Diagnosis present

## 2016-10-30 DIAGNOSIS — E785 Hyperlipidemia, unspecified: Secondary | ICD-10-CM | POA: Diagnosis not present

## 2016-10-30 HISTORY — PX: OPEN REDUCTION INTERNAL FIXATION (ORIF) TIBIA/FIBULA FRACTURE: SHX5992

## 2016-10-30 HISTORY — PX: ORIF TIBIA FRACTURE: SHX5416

## 2016-10-30 LAB — GLUCOSE, CAPILLARY
Glucose-Capillary: 123 mg/dL — ABNORMAL HIGH (ref 65–99)
Glucose-Capillary: 150 mg/dL — ABNORMAL HIGH (ref 65–99)
Glucose-Capillary: 166 mg/dL — ABNORMAL HIGH (ref 65–99)
Glucose-Capillary: 180 mg/dL — ABNORMAL HIGH (ref 65–99)
Glucose-Capillary: 185 mg/dL — ABNORMAL HIGH (ref 65–99)
Glucose-Capillary: 213 mg/dL — ABNORMAL HIGH (ref 65–99)
Glucose-Capillary: 215 mg/dL — ABNORMAL HIGH (ref 65–99)
Glucose-Capillary: 239 mg/dL — ABNORMAL HIGH (ref 65–99)
Glucose-Capillary: 239 mg/dL — ABNORMAL HIGH (ref 65–99)
Glucose-Capillary: 249 mg/dL — ABNORMAL HIGH (ref 65–99)
Glucose-Capillary: 268 mg/dL — ABNORMAL HIGH (ref 65–99)
Glucose-Capillary: 306 mg/dL — ABNORMAL HIGH (ref 65–99)

## 2016-10-30 LAB — CBC
HCT: 41.4 % (ref 39.0–52.0)
Hemoglobin: 14.4 g/dL (ref 13.0–17.0)
MCH: 30.2 pg (ref 26.0–34.0)
MCHC: 34.8 g/dL (ref 30.0–36.0)
MCV: 86.8 fL (ref 78.0–100.0)
Platelets: 250 10*3/uL (ref 150–400)
RBC: 4.77 MIL/uL (ref 4.22–5.81)
RDW: 13.3 % (ref 11.5–15.5)
WBC: 12.9 10*3/uL — ABNORMAL HIGH (ref 4.0–10.5)

## 2016-10-30 LAB — SURGICAL PCR SCREEN
MRSA, PCR: NEGATIVE
Staphylococcus aureus: NEGATIVE

## 2016-10-30 LAB — CREATININE, SERUM
Creatinine, Ser: 1.3 mg/dL — ABNORMAL HIGH (ref 0.61–1.24)
GFR calc Af Amer: >60 mL/min (ref >60)
GFR calc non Af Amer: >60 mL/min (ref >60)

## 2016-10-30 SURGERY — OPEN REDUCTION INTERNAL FIXATION (ORIF) TIBIA FRACTURE
Anesthesia: General | Laterality: Right

## 2016-10-30 MED ORDER — LEVOTHYROXINE SODIUM 100 MCG PO TABS
125.0000 ug | ORAL_TABLET | Freq: Every day | ORAL | Status: DC
  Administered 2016-10-30 – 2016-11-01 (×3): 125 ug via ORAL
  Filled 2016-10-30 (×3): qty 1

## 2016-10-30 MED ORDER — ONDANSETRON HCL 4 MG PO TABS: 4.0000 mg | ORAL_TABLET | Freq: Four times a day (QID) | ORAL | Status: DC | PRN

## 2016-10-30 MED ORDER — FENTANYL CITRATE (PF) 250 MCG/5ML IJ SOLN
INTRAMUSCULAR | Status: AC
  Filled 2016-10-30: qty 5

## 2016-10-30 MED ORDER — ONDANSETRON HCL 4 MG/2ML IJ SOLN
INTRAMUSCULAR | Status: AC
  Filled 2016-10-30: qty 2

## 2016-10-30 MED ORDER — SODIUM CHLORIDE 0.9 % IV SOLN
INTRAVENOUS | Status: DC
  Administered 2016-10-30: 4.9 [IU]/h via INTRAVENOUS
  Filled 2016-10-30: qty 1

## 2016-10-30 MED ORDER — CEFAZOLIN SODIUM-DEXTROSE 1-4 GM/50ML-% IV SOLN
1.0000 g | Freq: Four times a day (QID) | INTRAVENOUS | Status: AC
  Administered 2016-10-30 – 2016-10-31 (×3): 1 g via INTRAVENOUS
  Filled 2016-10-30 (×3): qty 50

## 2016-10-30 MED ORDER — INSULIN PUMP
Freq: Three times a day (TID) | SUBCUTANEOUS | Status: DC
  Administered 2016-10-30 – 2016-10-31 (×6): via SUBCUTANEOUS
  Administered 2016-11-01: 4 via SUBCUTANEOUS
  Administered 2016-11-01: 0.6 via SUBCUTANEOUS
  Filled 2016-10-30: qty 1

## 2016-10-30 MED ORDER — ACETAMINOPHEN 325 MG PO TABS
650.0000 mg | ORAL_TABLET | Freq: Four times a day (QID) | ORAL | Status: DC | PRN
  Administered 2016-11-01: 650 mg via ORAL
  Filled 2016-10-30: qty 2

## 2016-10-30 MED ORDER — PROPOFOL 10 MG/ML IV BOLUS
INTRAVENOUS | Status: AC
  Filled 2016-10-30: qty 20

## 2016-10-30 MED ORDER — LACTATED RINGERS IV SOLN
INTRAVENOUS | Status: DC
  Administered 2016-10-30 (×2): via INTRAVENOUS

## 2016-10-30 MED ORDER — DOCUSATE SODIUM 100 MG PO CAPS
100.0000 mg | ORAL_CAPSULE | Freq: Two times a day (BID) | ORAL | Status: DC
  Administered 2016-10-30 – 2016-11-01 (×4): 100 mg via ORAL
  Filled 2016-10-30 (×4): qty 1

## 2016-10-30 MED ORDER — SUGAMMADEX SODIUM 200 MG/2ML IV SOLN
INTRAVENOUS | Status: AC
  Filled 2016-10-30: qty 2

## 2016-10-30 MED ORDER — POTASSIUM CHLORIDE IN NACL 20-0.9 MEQ/L-% IV SOLN
INTRAVENOUS | Status: DC
  Administered 2016-10-30 – 2016-10-31 (×3): via INTRAVENOUS
  Filled 2016-10-30 (×3): qty 1000

## 2016-10-30 MED ORDER — PROMETHAZINE HCL 25 MG/ML IJ SOLN: 6.2500 mg | INTRAMUSCULAR | Status: DC | PRN

## 2016-10-30 MED ORDER — TRAZODONE HCL 50 MG PO TABS: 50.0000 mg | ORAL_TABLET | Freq: Every day | ORAL | Status: DC

## 2016-10-30 MED ORDER — PHENYLEPHRINE 40 MCG/ML (10ML) SYRINGE FOR IV PUSH (FOR BLOOD PRESSURE SUPPORT)
PREFILLED_SYRINGE | INTRAVENOUS | Status: DC | PRN
  Administered 2016-10-30: 80 ug via INTRAVENOUS

## 2016-10-30 MED ORDER — ENOXAPARIN SODIUM 40 MG/0.4ML ~~LOC~~ SOLN
40.0000 mg | SUBCUTANEOUS | Status: DC
  Administered 2016-10-31 – 2016-11-01 (×2): 40 mg via SUBCUTANEOUS
  Filled 2016-10-30 (×2): qty 0.4

## 2016-10-30 MED ORDER — SODIUM CHLORIDE 0.9 % IV SOLN
INTRAVENOUS | Status: AC
  Filled 2016-10-30: qty 1

## 2016-10-30 MED ORDER — DIPHENHYDRAMINE HCL 12.5 MG/5ML PO ELIX: 12.5000 mg | ORAL_SOLUTION | ORAL | Status: DC | PRN

## 2016-10-30 MED ORDER — LIDOCAINE 2% (20 MG/ML) 5 ML SYRINGE
INTRAMUSCULAR | Status: DC | PRN
  Administered 2016-10-30: 30 mg via INTRAVENOUS

## 2016-10-30 MED ORDER — SIMVASTATIN 20 MG PO TABS
20.0000 mg | ORAL_TABLET | Freq: Every day | ORAL | Status: DC
  Administered 2016-10-30 – 2016-10-31 (×2): 20 mg via ORAL
  Filled 2016-10-30 (×2): qty 1

## 2016-10-30 MED ORDER — METOCLOPRAMIDE HCL 5 MG PO TABS: 5.0000 mg | ORAL_TABLET | Freq: Three times a day (TID) | ORAL | Status: DC | PRN

## 2016-10-30 MED ORDER — DALFAMPRIDINE ER 10 MG PO TB12: 10.0000 mg | ORAL_TABLET | Freq: Two times a day (BID) | ORAL | Status: DC

## 2016-10-30 MED ORDER — MIDAZOLAM HCL 2 MG/2ML IJ SOLN: 0.5000 mg | Freq: Once | INTRAMUSCULAR | Status: DC | PRN

## 2016-10-30 MED ORDER — ONDANSETRON HCL 4 MG/2ML IJ SOLN: 4.0000 mg | Freq: Four times a day (QID) | INTRAMUSCULAR | Status: DC | PRN

## 2016-10-30 MED ORDER — CEFAZOLIN SODIUM-DEXTROSE 2-4 GM/100ML-% IV SOLN
2.0000 g | Freq: Once | INTRAVENOUS | Status: AC
  Administered 2016-10-30: 2 g via INTRAVENOUS

## 2016-10-30 MED ORDER — SODIUM CHLORIDE 0.9 % IV SOLN: INTRAVENOUS | Status: DC

## 2016-10-30 MED ORDER — MIDAZOLAM HCL 2 MG/2ML IJ SOLN
2.0000 mg | Freq: Once | INTRAMUSCULAR | Status: DC
  Filled 2016-10-30: qty 2

## 2016-10-30 MED ORDER — ACETAMINOPHEN 650 MG RE SUPP: 650.0000 mg | Freq: Four times a day (QID) | RECTAL | Status: DC | PRN

## 2016-10-30 MED ORDER — HYDROMORPHONE HCL 1 MG/ML IJ SOLN
0.5000 mg | INTRAMUSCULAR | Status: DC | PRN
  Administered 2016-10-30: 0.5 mg via INTRAVENOUS
  Administered 2016-11-01: 1 mg via INTRAVENOUS
  Filled 2016-10-30 (×2): qty 1

## 2016-10-30 MED ORDER — METHOCARBAMOL 500 MG PO TABS
1000.0000 mg | ORAL_TABLET | Freq: Four times a day (QID) | ORAL | Status: DC
  Administered 2016-10-30 – 2016-11-01 (×7): 1000 mg via ORAL
  Filled 2016-10-30 (×8): qty 2

## 2016-10-30 MED ORDER — DEXTROSE 50 % IV SOLN: 25.0000 mL | INTRAVENOUS | Status: DC | PRN

## 2016-10-30 MED ORDER — HYDROMORPHONE HCL 1 MG/ML IJ SOLN: 0.2500 mg | INTRAMUSCULAR | Status: DC | PRN

## 2016-10-30 MED ORDER — MEPERIDINE HCL 25 MG/ML IJ SOLN: 6.2500 mg | INTRAMUSCULAR | Status: DC | PRN

## 2016-10-30 MED ORDER — MIDAZOLAM HCL 2 MG/2ML IJ SOLN
INTRAMUSCULAR | Status: AC
  Filled 2016-10-30: qty 2

## 2016-10-30 MED ORDER — OXYCODONE HCL 5 MG PO TABS
5.0000 mg | ORAL_TABLET | ORAL | Status: DC | PRN
  Administered 2016-10-30: 10 mg via ORAL
  Administered 2016-10-30 – 2016-11-01 (×7): 15 mg via ORAL
  Administered 2016-11-01: 10 mg via ORAL
  Administered 2016-11-01: 15 mg via ORAL
  Filled 2016-10-30 (×2): qty 2
  Filled 2016-10-30 (×8): qty 3

## 2016-10-30 MED ORDER — CEFAZOLIN SODIUM-DEXTROSE 2-4 GM/100ML-% IV SOLN
INTRAVENOUS | Status: AC
  Filled 2016-10-30: qty 100

## 2016-10-30 MED ORDER — METHOCARBAMOL 1000 MG/10ML IJ SOLN
500.0000 mg | Freq: Four times a day (QID) | INTRAVENOUS | Status: DC
  Administered 2016-10-31: 500 mg via INTRAVENOUS
  Filled 2016-10-30 (×11): qty 5

## 2016-10-30 MED ORDER — DIPHENHYDRAMINE HCL 50 MG/ML IJ SOLN
INTRAMUSCULAR | Status: DC | PRN
  Administered 2016-10-30: 12.5 mg via INTRAVENOUS

## 2016-10-30 MED ORDER — ROCURONIUM BROMIDE 10 MG/ML (PF) SYRINGE
PREFILLED_SYRINGE | INTRAVENOUS | Status: DC | PRN
  Administered 2016-10-30 (×2): 50 mg via INTRAVENOUS

## 2016-10-30 MED ORDER — PROPOFOL 10 MG/ML IV BOLUS
INTRAVENOUS | Status: DC | PRN
  Administered 2016-10-30: 200 mg via INTRAVENOUS

## 2016-10-30 MED ORDER — FENTANYL CITRATE (PF) 100 MCG/2ML IJ SOLN
100.0000 ug | Freq: Once | INTRAMUSCULAR | Status: DC
Start: 2016-10-30 — End: 2016-10-30
  Filled 2016-10-30: qty 2

## 2016-10-30 MED ORDER — DIPHENHYDRAMINE HCL 50 MG/ML IJ SOLN
INTRAMUSCULAR | Status: AC
  Filled 2016-10-30: qty 1

## 2016-10-30 MED ORDER — SUGAMMADEX SODIUM 200 MG/2ML IV SOLN
INTRAVENOUS | Status: DC | PRN
  Administered 2016-10-30: 400 mg via INTRAVENOUS

## 2016-10-30 MED ORDER — DEXTROSE-NACL 5-0.45 % IV SOLN
INTRAVENOUS | Status: DC
  Administered 2016-10-30: 14:00:00 via INTRAVENOUS

## 2016-10-30 MED ORDER — BISACODYL 10 MG RE SUPP: 10.0000 mg | Freq: Every day | RECTAL | Status: DC | PRN

## 2016-10-30 MED ORDER — PHENYLEPHRINE 40 MCG/ML (10ML) SYRINGE FOR IV PUSH (FOR BLOOD PRESSURE SUPPORT)
PREFILLED_SYRINGE | INTRAVENOUS | Status: AC
  Filled 2016-10-30: qty 10

## 2016-10-30 MED ORDER — INSULIN PUMP
SUBCUTANEOUS | Status: DC
  Filled 2016-10-30: qty 1

## 2016-10-30 MED ORDER — METHOCARBAMOL 500 MG PO TABS
500.0000 mg | ORAL_TABLET | Freq: Four times a day (QID) | ORAL | Status: DC | PRN
  Administered 2016-10-30: 500 mg via ORAL
  Filled 2016-10-30 (×2): qty 1

## 2016-10-30 MED ORDER — MIDAZOLAM HCL 2 MG/2ML IJ SOLN
INTRAMUSCULAR | Status: DC | PRN
  Administered 2016-10-30: 2 mg via INTRAVENOUS

## 2016-10-30 MED ORDER — 0.9 % SODIUM CHLORIDE (POUR BTL) OPTIME
TOPICAL | Status: DC | PRN
  Administered 2016-10-30: 1000 mL

## 2016-10-30 MED ORDER — POLYETHYLENE GLYCOL 3350 17 G PO PACK
17.0000 g | PACK | Freq: Every day | ORAL | Status: DC
  Administered 2016-10-31 – 2016-11-01 (×2): 17 g via ORAL
  Filled 2016-10-30 (×2): qty 1

## 2016-10-30 MED ORDER — METOCLOPRAMIDE HCL 5 MG/ML IJ SOLN: 5.0000 mg | Freq: Three times a day (TID) | INTRAMUSCULAR | Status: DC | PRN

## 2016-10-30 MED ORDER — ONDANSETRON HCL 4 MG/2ML IJ SOLN
INTRAMUSCULAR | Status: DC | PRN
  Administered 2016-10-30 (×2): 4 mg via INTRAVENOUS

## 2016-10-30 MED ORDER — INSULIN REGULAR BOLUS VIA INFUSION
0.0000 [IU] | Freq: Three times a day (TID) | INTRAVENOUS | Status: DC
  Filled 2016-10-30: qty 10

## 2016-10-30 MED ORDER — FENTANYL CITRATE (PF) 250 MCG/5ML IJ SOLN
INTRAMUSCULAR | Status: DC | PRN
  Administered 2016-10-30: 50 ug via INTRAVENOUS
  Administered 2016-10-30: 250 ug via INTRAVENOUS

## 2016-10-30 MED ORDER — LIDOCAINE 2% (20 MG/ML) 5 ML SYRINGE
INTRAMUSCULAR | Status: AC
  Filled 2016-10-30: qty 5

## 2016-10-30 SURGICAL SUPPLY — 96 items
BANDAGE ACE 4X5 VEL STRL LF (GAUZE/BANDAGES/DRESSINGS) ×2 IMPLANT
BANDAGE ACE 6X5 VEL STRL LF (GAUZE/BANDAGES/DRESSINGS) ×2 IMPLANT
BANDAGE ELASTIC 4 VELCRO ST LF (GAUZE/BANDAGES/DRESSINGS) ×1 IMPLANT
BANDAGE ELASTIC 6 VELCRO ST LF (GAUZE/BANDAGES/DRESSINGS) ×2 IMPLANT
BANDAGE ESMARK 6X9 LF (GAUZE/BANDAGES/DRESSINGS) ×1 IMPLANT
BIT DRILL 2.5X2.75 QC CALB (BIT) ×1 IMPLANT
BIT DRILL 3.5X5.5 QC CALB (BIT) ×1 IMPLANT
BIT DRILL CALIBRATED 2.7 (BIT) ×2 IMPLANT
BLADE CLIPPER SURG (BLADE) IMPLANT
BNDG CMPR 9X6 STRL LF SNTH (GAUZE/BANDAGES/DRESSINGS)
BNDG COHESIVE 4X5 TAN STRL (GAUZE/BANDAGES/DRESSINGS) IMPLANT
BNDG COHESIVE 6X5 TAN STRL LF (GAUZE/BANDAGES/DRESSINGS) IMPLANT
BNDG ESMARK 6X9 LF (GAUZE/BANDAGES/DRESSINGS)
BNDG GAUZE ELAST 4 BULKY (GAUZE/BANDAGES/DRESSINGS) ×4 IMPLANT
BRUSH SCRUB SURG 4.25 DISP (MISCELLANEOUS) ×4 IMPLANT
COVER MAYO STAND STRL (DRAPES) ×2 IMPLANT
COVER SURGICAL LIGHT HANDLE (MISCELLANEOUS) ×3 IMPLANT
CUFF TOURNIQUET SINGLE 18IN (TOURNIQUET CUFF) IMPLANT
DRAPE C-ARM 42X72 X-RAY (DRAPES) ×2 IMPLANT
DRAPE C-ARMOR (DRAPES) ×2 IMPLANT
DRAPE HALF SHEET 40X57 (DRAPES) ×4 IMPLANT
DRAPE INCISE IOBAN 66X45 STRL (DRAPES) ×2 IMPLANT
DRAPE U-SHAPE 47X51 STRL (DRAPES) ×2 IMPLANT
DRSG ADAPTIC 3X8 NADH LF (GAUZE/BANDAGES/DRESSINGS) ×2 IMPLANT
DRSG MEPITEL 4X7.2 (GAUZE/BANDAGES/DRESSINGS) IMPLANT
DRSG PAD ABDOMINAL 8X10 ST (GAUZE/BANDAGES/DRESSINGS) ×6 IMPLANT
ELECT REM PT RETURN 9FT ADLT (ELECTROSURGICAL) ×2
ELECTRODE REM PT RTRN 9FT ADLT (ELECTROSURGICAL) ×1 IMPLANT
EVACUATOR 1/8 PVC DRAIN (DRAIN) IMPLANT
GAUZE SPONGE 4X4 12PLY STRL (GAUZE/BANDAGES/DRESSINGS) ×2 IMPLANT
GAUZE SPONGE 4X4 16PLY XRAY LF (GAUZE/BANDAGES/DRESSINGS) ×2 IMPLANT
GLOVE BIO SURGEON STRL SZ7.5 (GLOVE) ×3 IMPLANT
GLOVE BIO SURGEON STRL SZ8 (GLOVE) ×2 IMPLANT
GLOVE BIOGEL PI IND STRL 7.5 (GLOVE) ×1 IMPLANT
GLOVE BIOGEL PI IND STRL 8 (GLOVE) ×1 IMPLANT
GLOVE BIOGEL PI INDICATOR 7.5 (GLOVE) ×1
GLOVE BIOGEL PI INDICATOR 8 (GLOVE) ×1
GLOVE PROGUARD SZ 7 1/2 (GLOVE) ×2 IMPLANT
GOWN STRL REUS W/ TWL LRG LVL3 (GOWN DISPOSABLE) ×2 IMPLANT
GOWN STRL REUS W/ TWL XL LVL3 (GOWN DISPOSABLE) ×1 IMPLANT
GOWN STRL REUS W/TWL LRG LVL3 (GOWN DISPOSABLE) ×4
GOWN STRL REUS W/TWL XL LVL3 (GOWN DISPOSABLE) ×2
KIT BASIN OR (CUSTOM PROCEDURE TRAY) ×2 IMPLANT
KIT ROOM TURNOVER OR (KITS) ×2 IMPLANT
MANIFOLD NEPTUNE II (INSTRUMENTS) ×2 IMPLANT
NEEDLE 22X1 1/2 (OR ONLY) (NEEDLE) IMPLANT
NS IRRIG 1000ML POUR BTL (IV SOLUTION) ×2 IMPLANT
PACK ORTHO EXTREMITY (CUSTOM PROCEDURE TRAY) ×2 IMPLANT
PAD ARMBOARD 7.5X6 YLW CONV (MISCELLANEOUS) ×4 IMPLANT
PAD CAST 4YDX4 CTTN HI CHSV (CAST SUPPLIES) ×1 IMPLANT
PADDING CAST COTTON 4X4 STRL (CAST SUPPLIES) ×2
PADDING CAST COTTON 6X4 STRL (CAST SUPPLIES) ×2 IMPLANT
PLATE 6H RT DIST ANTLAT TIB (Plate) ×2 IMPLANT
PLATE ANTLAT CNTR NAR 114X6 (Plate) IMPLANT
PLATE LOCK 7H 92 BILAT FIB (Plate) ×2 IMPLANT
SCREW CORT 3.5X26 (Screw) ×2 IMPLANT
SCREW CORT 3.5X40 (Screw) ×1 IMPLANT
SCREW CORT FT 32X3.5XNONLOCK (Screw) IMPLANT
SCREW CORT T15 26X3.5XST LCK (Screw) ×1 IMPLANT
SCREW CORTICAL 3.5MM  30MM (Screw) ×1 IMPLANT
SCREW CORTICAL 3.5MM  32MM (Screw) ×2 IMPLANT
SCREW CORTICAL 3.5MM 30MM (Screw) IMPLANT
SCREW CORTICAL 3.5MM 32MM (Screw) ×2 IMPLANT
SCREW CORTICAL 3.5MM 50MM (Screw) ×1 IMPLANT
SCREW LOCK CORT STAR 3.5X20 (Screw) ×2 IMPLANT
SCREW LOCK CORT STAR 3.5X22 (Screw) ×1 IMPLANT
SCREW LOCK CORT STAR 3.5X28 (Screw) ×2 IMPLANT
SCREW LOCK CORT STAR 3.5X42 (Screw) ×4 IMPLANT
SCREW LOW PROFILE 18MMX3.5MM (Screw) ×2 IMPLANT
SCREW LOW PROFILE 22MMX3.5MM (Screw) ×2 IMPLANT
SCREW LOW PROFILE 3.5X48MM (Screw) ×1 IMPLANT
SCREW NON LOCKING LP 3.5 14MM (Screw) ×1 IMPLANT
SCREW NON LOCKING LP 3.5 16MM (Screw) ×1 IMPLANT
SPLINT PLASTER CAST XFAST 5X30 (CAST SUPPLIES) ×1 IMPLANT
SPLINT PLASTER XFAST SET 5X30 (CAST SUPPLIES) ×1
SPONGE LAP 18X18 X RAY DECT (DISPOSABLE) ×2 IMPLANT
STAPLER VISISTAT 35W (STAPLE) ×2 IMPLANT
STOCKINETTE IMPERVIOUS LG (DRAPES) IMPLANT
STRIP CLOSURE SKIN 1/2X4 (GAUZE/BANDAGES/DRESSINGS) IMPLANT
SUCTION FRAZIER HANDLE 10FR (MISCELLANEOUS) ×1
SUCTION TUBE FRAZIER 10FR DISP (MISCELLANEOUS) ×1 IMPLANT
SUT ETHILON 3 0 PS 1 (SUTURE) ×3 IMPLANT
SUT VIC AB 0 CT1 27 (SUTURE)
SUT VIC AB 0 CT1 27XBRD ANBCTR (SUTURE) IMPLANT
SUT VIC AB 1 CT1 27 (SUTURE)
SUT VIC AB 1 CT1 27XBRD ANBCTR (SUTURE) IMPLANT
SUT VIC AB 2-0 CT1 27 (SUTURE) ×4
SUT VIC AB 2-0 CT1 TAPERPNT 27 (SUTURE) ×2 IMPLANT
TOWEL OR 17X24 6PK STRL BLUE (TOWEL DISPOSABLE) ×4 IMPLANT
TOWEL OR 17X26 10 PK STRL BLUE (TOWEL DISPOSABLE) ×3 IMPLANT
TRAY FOLEY W/METER SILVER 16FR (SET/KITS/TRAYS/PACK) IMPLANT
TUBE CONNECTING 12X1/4 (SUCTIONS) ×2 IMPLANT
UNDERPAD 30X30 (UNDERPADS AND DIAPERS) ×2 IMPLANT
WATER STERILE IRR 1000ML POUR (IV SOLUTION) ×4 IMPLANT
WIRE K 1.6MM 144256 (MISCELLANEOUS) ×3 IMPLANT
YANKAUER SUCT BULB TIP NO VENT (SUCTIONS) ×2 IMPLANT

## 2016-10-30 NOTE — Progress Notes (Signed)
Gertie Fey called this RN and verbally order Robaxin 500 mg tab Q6H PRN for muscle spasm.  Administered Robaxin to patient per order. Will monitor.

## 2016-10-30 NOTE — ED Provider Notes (Signed)
[place] DEPT Provider Note   CSN: [id number] Arrival date & time: 10/29/16  2054     History   Chief Complaint Chief Complaint  Patient presents with  . Leg Pain    HPI Johnathan Ross is a 46 y.o. male.  The history is provided by the patient and medical records. No language interpreter was used.  Leg Pain   This is a new problem. The current episode started 3 to 5 hours ago. The problem occurs constantly. The problem has not changed since onset.The pain is present in the right ankle. The quality of the pain is described as aching. The pain is severe. Associated symptoms include limited range of motion. The symptoms are aggravated by standing.    Past Medical History:  Diagnosis Date  . Hyperlipidemia   . Hypertension   . Hypothyroidism   . Multiple sclerosis ([place])   . 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 Type 1 diabetes mellitus [facility])     Patient Active Problem List   Diagnosis Date Noted  . Closed fracture of right fibula and tibia 10/29/2016  . Closed tibia fracture 10/29/2016  . Closed fracture of right tibial plafond with fibula involvement 10/29/2016  . Abnormal liver function tests 03/06/2013  . RASH AND OTHER NONSPECIFIC SKIN ERUPTION 10/07/2009  . SHOULDER PAIN, LEFT 12/27/2008  . Type I (juvenile type) diabetes mellitus without mention of complication, uncontrolled 12/03/2007  . HYPOTHYROIDISM 11/12/2007  . HYPERCHOLESTEROLEMIA 11/12/2007  . MULTIPLE SCLEROSIS 08/29/2007  . PROLIFERATIVE DIABETIC RETINOPATHY 08/29/2007  . HYPERTENSION 12/25/2006    History reviewed. No pertinent surgical history.     Home Medications    Prior to Admission medications   Medication Sig Start Date End Date Taking? Authorizing Provider  dalfampridine (AMPYRA) 10 MG TB12 Take 10 mg by mouth See admin instructions. Take one tablet (10 mg) by mouth every morning and mid afternoon   Yes [provider]  Insulin Human (INSULIN PUMP) SOLN Inject  into the skin continuous. Novolog   Yes [provider]  levothyroxine (SYNTHROID) 125 MCG tablet Take 1 tablet (125 mcg total) by mouth daily before breakfast. Patient taking differently: Take 125 mcg by mouth at bedtime.  07/11/16  Yes Elayne Snare, MD  lisinopril (PRINIVIL,ZESTRIL) 10 MG tablet TAKE 1 TABLET BY MOUTH EVERY DAY Patient taking differently: TAKE 1 TABLET BY MOUTH DAILY AT BEDTIME 08/27/16  Yes Elayne Snare, MD  simvastatin (ZOCOR) 20 MG tablet TAKE 1 TABLET BY MOUTH AT BEDTIME Patient taking differently: TAKE 1 TABLET (20 MG) BY MOUTH AT BEDTIME 09/13/16  Yes Elayne Snare, MD  glucose blood (BAYER CONTOUR NEXT TEST) test strip USE AS INSTRUCTED TO CHECK BLOOD SUGARS 8 TIMES PER DAY DX CODE E10.65 08/31/16   Elayne Snare, MD  magic mouthwash SOLN 1 tablespoon 3 times a day to swish and discard. Patient not taking: Reported on 10/29/2016 09/24/16   Elayne Snare, MD  NOVOLOG 100 UNIT/ML injection INJECT 80 UNITS INTO THE SKIN ONCE. Patient not taking: Reported on 10/29/2016 10/08/16   Elayne Snare, MD    Family History Family History  Problem Relation Age of Onset  . Diabetes Sister   . Colon cancer Maternal Grandmother   . Colon polyps Maternal Uncle     Social History Social History  Substance Use Topics  . Smoking status: Never Smoker  . Smokeless tobacco: Never Used  . Alcohol use No     Allergies   Patient has no known allergies.   Review of Systems  Review of Systems  Constitutional: Negative for chills and fever.  HENT: Negative for ear pain and sore throat.   Eyes: Negative for pain and visual disturbance.  Respiratory: Negative for cough and shortness of breath.   Cardiovascular: Negative for chest pain and palpitations.  Gastrointestinal: Negative for abdominal pain and vomiting.  Genitourinary: Negative for dysuria and hematuria.  Musculoskeletal: Negative for arthralgias and back pain.       Positive for R lower leg and ankle pain  Skin: Negative for  color change and rash.  Neurological: Negative for seizures and syncope.  All other systems reviewed and are negative.    Physical Exam Updated Vital Signs BP (!) 143/79 (BP Location: Left Arm)   Pulse 94   Temp 99.2 F (37.3 C) (Oral)   Resp 17   Ht 6\' 1"  (1.854 m)   Wt 97.4 kg (214 lb 11.2 oz)   SpO2 100%   BMI 28.33 kg/m   Physical Exam  Constitutional: He appears well-developed and well-nourished.  HENT:  Head: Normocephalic and atraumatic.  Mouth/Throat: Oropharynx is clear and moist.  Eyes: Conjunctivae are normal.  Neck: Neck supple.  Cardiovascular: Normal rate and regular rhythm.   No murmur heard. Pulmonary/Chest: Effort normal and breath sounds normal. No respiratory distress.  Abdominal: Soft. He exhibits no distension. There is no tenderness. There is no guarding.  Musculoskeletal: He exhibits edema, tenderness and deformity.  RLL and ankle in splint. 2+ DP pulse and intact sensation  Neurological: He is alert. No cranial nerve deficit. Coordination normal.  Moves all extremities with intact sensation  Skin: Skin is warm and dry.  Nursing note and vitals reviewed.    ED Treatments / Results  Labs (all labs ordered are listed, but only abnormal results are displayed) Labs Reviewed  CBC - Abnormal; Notable for the following:       Result Value   WBC 15.0 (*)    All other components within normal limits  BASIC METABOLIC PANEL - Abnormal; Notable for the following:    Glucose, Bld 214 (*)    Creatinine, Ser 1.28 (*)    All other components within normal limits  GLUCOSE, CAPILLARY - Abnormal; Notable for the following:    Glucose-Capillary 194 (*)    All other components within normal limits  GLUCOSE, CAPILLARY - Abnormal; Notable for the following:    Glucose-Capillary 213 (*)    All other components within normal limits  CBG MONITORING, ED - Abnormal; Notable for the following:    Glucose-Capillary 184 (*)    All other components within normal  limits  SURGICAL PCR SCREEN  TYPE AND SCREEN  ABO/RH    EKG  EKG Interpretation None       Radiology Dg Tibia/fibula Right  Result Date: 10/29/2016 CLINICAL DATA:  At post fall. EXAM: RIGHT TIBIA AND FIBULA - 2 VIEW COMPARISON:  None. FINDINGS: Spiral fracture the distal tibial metaphysis extending to the medial apophysis and the articular surface. 4 mm of lateral displacement. Oblique fracture of the distal fibular metaphysis with 6 mm of posterior displacement. Nondisplaced oblique fracture of the proximal fibular metadiaphysis. No lytic or sclerotic osseous lesion. Soft tissue swelling over the medial and lateral malleolus. IMPRESSION: 1. Spiral fracture the distal tibial metaphysis extending to the medial apophysis and the articular surface. 4 mm of lateral displacement. 2. Oblique fracture of the distal fibular metaphysis with 6 mm of posterior displacement. 3. Nondisplaced oblique fracture of the proximal fibular metadiaphysis. Electronically Signed   By:  Kathreen Devoid   On: 10/29/2016 20:09   Dg Ankle Complete Right  Result Date: 10/29/2016 CLINICAL DATA:  Pain after right ankle twisting injury. EXAM: RIGHT ANKLE - COMPLETE 3+ VIEW COMPARISON:  None. FINDINGS: Acute, closed intra-articular fractures of the distal tibial diaphysis and metaphysis extending into the ankle mortise just medial to the medial malleolus is noted. There is slight lateral displacement of the distal fracture fragment approximately 5 mm. Acute coronal oblique fracture of the distal fibular diaphysis is also noted with lateral and slight dorsal displacement of the distal fracture fragment also extending into the ankle joint. No widening of the ankle mortise. The subtalar and midfoot articulations are maintained. Vascular calcifications are seen about the ankle. Soft tissue swelling is noted about the malleoli. Small ankle joint effusion. IMPRESSION: Acute, closed intra-articular fractures of the distal tibia and fibula  extending into the ankle joint without mortise widening. Slight lateral and dorsal displacement of the distal fibular fracture fragment is noted. Slight lateral displacement of the distal tibial fracture fragment up to 5 mm is also seen. Electronically Signed   By: Ashley Royalty M.D.   On: 10/29/2016 19:02   Ct Ankle Right Wo Contrast  Result Date: 10/30/2016 CLINICAL DATA:  Closed fracture of the right tibial plafond with fibular involvement. EXAM: CT OF THE RIGHT ANKLE WITHOUT CONTRAST TECHNIQUE: Multidetector CT imaging of the right ankle was performed according to the standard protocol. Multiplanar CT image reconstructions were also generated. COMPARISON:  10/29/2016 radiographs of the right ankle FINDINGS: Bones/Joint/Cartilage 1. Acute closed comminuted fracture of the distal right tibial diaphysis, predominantly coronal oblique in orientation with more anterior sagittal oblique fracture involving the anteromedial corner of the tibial metaphysis and epiphysis, series 8, image 26. The tibial fracture enters the ankle joint sagittally just medial to the medial malleolus, series 3, image 55. There is 4 mm of lateral displacement of the main tibial distal fracture fragment and 3 mm of anterior displacement of the anteromedial tibial fracture fragment. 2. Acute closed oblique fracture of the distal fibular metaphysis with slight valgus angulation of the distal fracture fragment. Fracture extends into the ankle joint. The ankle and subtalar joints are maintained as are the midfoot articulations. No significant widening of the ankle mortise. A small bone island is noted of the navicular. Ligaments Suboptimally assessed by CT. Muscles and Tendons No intramuscular hemorrhage. No muscle atrophy. The mediolateral tendons crossing the ankle joint do not appear entrapped. The extensor tendons are unremarkable the outlined by edema. Soft tissues Diffuse circumferential soft tissue edema secondary to fracture. No hematoma.  IMPRESSION: 1. Acute closed comminuted fracture of the distal right tibial diaphysis, predominantly coronal oblique in orientation with more anterior sagittal oblique fracture involving the anteromedial corner of the tibial metaphysis and epiphysis, series 8, image 26. The tibial fracture enters the ankle joint sagittally just medial to the medial malleolus, series 3, image 55. There is 4 mm of lateral displacement of the main tibial distal fracture fragment and 3 mm of anterior displacement of the anteromedial tibial fracture fragment. 2. Acute closed oblique fracture of the distal fibular metaphysis with slight valgus angulation of the distal fracture fragment. Fracture extends into the ankle joint. 3. No entrapment of the tendons crossing the ankle joint. 4. No significant widening of the ankle mortise. 5. Circumferential soft tissue edema and swelling about the ankle and visualized distal leg. Electronically Signed   By: Ashley Royalty M.D.   On: 10/30/2016 00:45    Procedures Procedures (  including critical care time)  Medications Ordered in ED Medications  fentaNYL (SUBLIMAZE) injection 50 mcg (50 mcg Intravenous Given 10/30/16 0028)  methocarbamol (ROBAXIN) tablet 500 mg (500 mg Oral Given 10/30/16 0127)     Initial Impression / Assessment and Plan / ED Course  I have reviewed the triage vital signs and the nursing notes.  Pertinent labs & imaging results that were available during my care of the patient were reviewed by me and considered in my medical decision making (see chart for details).     60 yoM h/o T1DM, HTN, and HLD who presents as a tx from Urgent Care for closed R distal tib/fib fx and proximal fib nondisplaced fracture. Was walking on some gravel with loose rocks. Both feet slipped and pt felt R ankle crumple underneath him. He presented to Urgent care where XRs showed closed distal R tib/fib fx. Pt placed in stirrup splint and transferred for further management. Pt denies hitting  head or LOC. No other extremity pain.  AF, VSS. RLL in splint. Ace wrap removed and R DP pulse palpated. Intact distal sensation and motor function.  Ortho consulted and plan to admit pt. Pt stable at time of transfer.  Final Clinical Impressions(s) / ED Diagnoses   Final diagnoses:  Closed displaced spiral fracture of shaft of right tibia, initial encounter    New Prescriptions Current Discharge Medication List       Payton Emerald, MD 10/30/16 0405    Noemi Chapel, MD 10/30/16 1007

## 2016-10-30 NOTE — Progress Notes (Signed)
Patient requesting medication for muscle spasm and to sleep.  Called [facility] office for the secretary to page on-call [place]. Awaiting response.  Gave patient earlier PRN Fentanyl 50 mcg per order.

## 2016-10-30 NOTE — Transfer of Care (Signed)
Immediate Anesthesia Transfer of Care Note  Patient: Johnathan Ross  Procedure(s) Performed: Procedure(s): OPEN REDUCTION INTERNAL FIXATION (ORIF) TIBIA FIBULA FRACTURE (Right)  Patient Location: PACU  Anesthesia Type:General  Level of Consciousness: awake, alert  and oriented  Airway & Oxygen Therapy: Patient Spontanous Breathing and Patient connected to nasal cannula oxygen  Post-op Assessment: Report given to RN and Post -op Vital signs reviewed and stable  Post vital signs: Reviewed and stable  Last Vitals:  Vitals:   10/29/16 2328 10/30/16 0422  BP: (!) 143/79 (!) 115/54  Pulse: 94 93  Resp: 17 17  Temp: 37.3 C 37.3 C    Last Pain:  Vitals:   10/30/16 0459  TempSrc:   PainSc: 2          Complications: No apparent anesthesia complications

## 2016-10-30 NOTE — Progress Notes (Signed)
Patient administered 11.7 units of Novolog insulin from his insulin pump based on CBG=194.

## 2016-10-30 NOTE — Op Note (Signed)
Johnathan, Ross NO.:  [id number]  MEDICAL RECORD NO.:  [id number]  LOCATION:                                 FACILITY:  PHYSICIAN:  Astrid Divine. Marcelino Scot, M.D.      DATE OF BIRTH:  DATE OF PROCEDURE:  10/30/2016 DATE OF DISCHARGE:                              OPERATIVE REPORT   PREOPERATIVE DIAGNOSIS:  Right pilon fracture, tibia and fibula.  POSTOPERATIVE DIAGNOSIS:  Right pilon fracture, tibia and fibula.  PROCEDURE: 1. ORIF of right pilon fracture, tibia and fibula. 2. Stress evaluation of the syndesmosis under fluoro.  SURGEON:  Astrid Divine. Marcelino Scot, M.D.  ASSISTANT:  Ainsley Spinner, PA-C.  ANESTHESIA:  General.  COMPLICATIONS:  None.  TOURNIQUET:  None.  I/O:  1500 mL crystalloid.  ESTIMATED BLOOD LOSS:  50 mL.  DISPOSITION:  To PACU.  CONDITION:  Stable.  BRIEF SUMMARY AND INDICATION FOR PROCEDURE:  Johnathan Ross is a 46 year old male diabetic who sustained a fall in his driveway resulting in a displaced and shortened pilon fracture of the right tibia that involved both the tibia and the fibula.  The patient was initially seen and evaluated by Dr. Rod Can, who recognized the complexity of the injury and asserted this was outside his scope of practice and will be best managed by fellowship trained orthopedic traumatologist. Consequently, we were consulted to evaluate the patient and to handle whatever treatment was appropriate.  I did discuss with the patient and his parents, the risks and benefits of surgical repair including the potential for infection, which is elevated in his situation because of the underlying diabetes as well as delayed union, hardware failure, and need for further surgery among others.  We specifically discussed the syndesmosis and how there was a portion of the bone anteriorly that appeared to still be intact and within the groove, and the posterior portion appeared to be subluxed and/or torn and that separate  fixation would be required if stress evaluation revealed instability.  We also discussed possibility of staged treatment with external fixation, but we are hopeful because of the low velocity involved and low energy that he would be a candidate for early surgical repair.  DVT, PE, heart attack, stroke, infection, nerve and vessel injury, and others were discussed with the patient, and he did wish to proceed.  SUMMARY OF PROCEDURE:  The patient was taken to the operating room after administration of preoperative antibiotics.  General anesthesia was induced.  The right lower extremity was cleaned with chlorhexidine soap and scrub and then followed by Betadine scrub and paint.  I began with the fibula where a posterior incision was made, the dissection carried carefully down to the fibula.  The periosteum was left intact, but the fracture site was cleaned with curette and lavaged.  A Lobster claw in addition to traction and derotation by my assistant was used to restore length while the Lobster claw directly on the fracture site resulted in reduction.  This was checked on fluoro and then, a posterolateral buttress plate applied from the Biomet set, securing proximal fixation first and then distal fixation using combination of both locked and standard.  I did compress this  and then, once compressed, placed a lag screw for additional compression directly across the fracture site which appeared to interdigitate the fragment nicely.  Final images showed appropriate reduction, hardware placement on AP, mortise, and lateral views and Ainsley Spinner, PAC, did assist throughout.  We then performed a wound closure and re-evaluated the anterior soft tissues.  Because we still had wrinkling in the anticipated area of incision, we felt that it was most prudent to proceed with direct fixation using a limited dissection.  A midline incision was made giving [place] a 7-cm skin bridge and keeping the vessel with  this bridge laterally.  Dissection was carried carefully down where the anterior compartment was released, and the anterior tib swept laterally.  The fracture was identified and cleaned with curette and lavaged.  A reduction maneuver with the Lobster claw was then performed of the lateral aspect of the plafond.  This was compressed and an anterior-to-posterior lag screw placed to secure it. The clamp was then removed and attention turned to the anteromedial fragment here.  The sharp tenaculum clamp was used again achieving compression, this was pinned provisionally and then, the Biomet plate used to secure fixation with standard conical screws and a lag technique anterior to posterior on the medial side, followed by additional standard conical screw anterior to posterior on the lateral side of the plafond or articular segment.  Once this was done, we then tightened the screw in the sliding hole achieving compression across this area.  All fragments interdigitated and appeared to be under compression, lock screws were then placed in the 2 central screws distally.  I then placed additional standard fixation in the proximal fragment with conical screws and then 1 lock screw because the fracture involved both medial and lateral aspects distally.  C-arm was brought in.  My assistant, Ainsley Spinner, PAC, retracted the nerve and vessel to protect it throughout and also helped to place provisional fixation and produced compression while it was tightening definitive fixation.  Once the C-arm was in place, AP, lateral, and mortise views were obtained, and then, a stress view was performed in which we did not identify any lateral subluxation of the talus, syndesmotic widening, or increase in the medial clear space.  The wounds were irrigated thoroughly and closed in standard layered fashion using 2-0 Vicryl and 3-0 nylon.  Sterile gently compressive dressing was applied and then, a posterior and  stirrup splint.  The patient was awakened from anesthesia and transported to PACU in stable condition.  Again, Ainsley Spinner, PAC, did assist me throughout.  PROGNOSIS:  Mr. Caples is at increased risk for infection, nonunion, delayed union, and soft tissue complications because of his diabetes. We are hopeful that he can return to what he describes as excellent overall management with sugars consistently below 180.  He will be nonweightbearing for the next 6-8 weeks with protected graduated weightbearing thereafter and will be on formal pharmacologic DVT prophylaxis.     Astrid Divine. Marcelino Scot, M.D.   ______________________________ Astrid Divine. Marcelino Scot, M.D.    MHH/MEDQ  D:  10/30/2016  T:  10/30/2016  Job:  [id number]

## 2016-10-30 NOTE — Anesthesia Procedure Notes (Signed)
Procedure Name: Intubation Date/Time: 10/30/2016 9:06 AM Performed by: Mervyn Gay Pre-anesthesia Checklist: Patient identified, Patient being monitored, Timeout performed, Emergency Drugs available and Suction available Patient Re-evaluated:Patient Re-evaluated prior to inductionOxygen Delivery Method: Circle System Utilized Preoxygenation: Pre-oxygenation with 100% oxygen Intubation Type: IV induction Ventilation: Mask ventilation without difficulty Laryngoscope Size: Miller and 3 Grade View: Grade I Tube type: Oral Tube size: 8.0 mm Number of attempts: 1 Airway Equipment and Method: Stylet Placement Confirmation: ETT inserted through vocal cords under direct vision,  positive ETCO2 and breath sounds checked- equal and bilateral Secured at: 21 cm Tube secured with: Tape Dental Injury: Teeth and Oropharynx as per pre-operative assessment

## 2016-10-30 NOTE — Progress Notes (Signed)
Call received from [place]. Patient is now back from surgery and currently on insulin drip.  He may resume insulin pump once alert and oriented.  Will see pt. To evaluate ability to manage insulin pump.  RN states he is currently very sleepy.   Thanks, Adah Perl, RN, BC-ADM Inpatient Diabetes Coordinator Pager [phone] (8a-5p)

## 2016-10-30 NOTE — Progress Notes (Signed)
Assessed patient for ability to manage insulin pump.  He is currently sitting up and eating.  Orders received for patient to restart insulin pump from PA and d/c IV insulin one hour after restart of insulin pump.  Orders received for IV fluids as well.  Patient states he is not due to change site until insulin is gone out of insulin pump.  He currently has 67 units of insulin left in insulin pump cartridge.  Instructed patient to have insulin pump supplies at bedside.   Insulin pump settings per previous note- patient see's Dr. Dwyane Dee.  Will follow.    Thanks, Adah Perl, RN, BC-ADM Inpatient Diabetes Coordinator Pager [phone] (8a-5p)

## 2016-10-30 NOTE — Anesthesia Postprocedure Evaluation (Signed)
Anesthesia Post Note  Patient: ARKIN IMRAN  Procedure(s) Performed: Procedure(s) (LRB): OPEN REDUCTION INTERNAL FIXATION (ORIF) TIBIA FIBULA FRACTURE (Right)     Patient location during evaluation: PACU Anesthesia Type: General Level of consciousness: sedated, patient cooperative and oriented Pain management: pain level controlled Vital Signs Assessment: post-procedure vital signs reviewed and stable Respiratory status: spontaneous breathing, nonlabored ventilation, respiratory function stable and patient connected to nasal cannula oxygen Cardiovascular status: blood pressure returned to baseline and stable Postop Assessment: no signs of nausea or vomiting Anesthetic complications: no    Last Vitals:  Vitals:   10/30/16 1300 10/30/16 1335  BP: (!) 158/86 (!) 146/82  Pulse: 84 89  Resp: 14 14  Temp: 37 C 36.8 C    Last Pain:  Vitals:   10/30/16 1335  TempSrc: Oral  PainSc:                  Jonay Hitchcock,E. Charlet Harr

## 2016-10-30 NOTE — Anesthesia Preprocedure Evaluation (Signed)
Anesthesia Evaluation  Patient identified by MRN, date of birth, ID band Patient awake    Reviewed: Allergy & Precautions, NPO status , Patient's Chart, lab work & pertinent test results  History of Anesthesia Complications Negative for: history of anesthetic complications  Airway Mallampati: II  TM Distance: >3 FB Neck ROM: Full    Dental  (+) Dental Advisory Given   Pulmonary neg pulmonary ROS,    breath sounds clear to auscultation       Cardiovascular hypertension, Pt. on medications (-) angina Rhythm:Regular Rate:Normal     Neuro/Psych negative neurological ROS     GI/Hepatic negative GI ROS, Neg liver ROS,   Endo/Other  diabetes (glu 249), Insulin DependentHypothyroidism   Renal/GU Renal InsufficiencyRenal disease (creat 1.28)     Musculoskeletal   Abdominal   Peds  Hematology negative hematology ROS (+)   Anesthesia Other Findings   Reproductive/Obstetrics                             Anesthesia Physical Anesthesia Plan  ASA: III  Anesthesia Plan: General   Post-op Pain Management:    Induction: Intravenous  PONV Risk Score and Plan: 3 and Ondansetron, Propofol, Midazolam, Treatment may vary due to age and Diphenhydramine  Airway Management Planned: Oral ETT  Additional Equipment:   Intra-op Plan:   Post-operative Plan: Extubation in OR  Informed Consent: I have reviewed the patients History and Physical, chart, labs and discussed the procedure including the risks, benefits and alternatives for the proposed anesthesia with the patient or authorized representative who has indicated his/her understanding and acceptance.   Dental advisory given  Plan Discussed with: CRNA and Surgeon  Anesthesia Plan Comments: (Plan routine monitors, GETA)        Anesthesia Quick Evaluation

## 2016-10-30 NOTE — Progress Notes (Signed)
Received pt from PACU, sleepy. Right short leg splint with ace wrap dry and intact. Toes warm, able to move it slightly. Elevated to pillows. Pt is on IV Insulin infusion upon arrival to the unit, CBGs monitored every hour. 1600 Insulin IV infusion d/c'd 1 hour after the pt started his own insulin pump.

## 2016-10-30 NOTE — Brief Op Note (Signed)
10/29/2016 - 10/30/2016  11:52 AM  PATIENT:  Johnathan Ross  46 y.o. male  PRE-OPERATIVE DIAGNOSIS:  RIGHT PILON FRACTURE, TIBIA AND FIBULA  POST-OPERATIVE DIAGNOSIS:  RIGHT PILON FRACTURE, TIBIA AND FIBULA  PROCEDURE:  Procedure(s): 1. OPEN REDUCTION INTERNAL FIXATION (ORIF) RIGHT PILON FRACTURE, TIBIA AND FIBULA (Right) 2. STRESS FLOUROSCOPY OF RIGHT ANKLE SYNDESMOSIS  SURGEON:  Surgeon(s) and Role:    Altamese Paisano Park, MD - Primary  PHYSICIAN ASSISTANT: Ainsley Spinner, PA-C  ANESTHESIA:   general  EBL:  Total I/O In: 1500 [I.V.:1500] Out: 50 [Blood:50]  BLOOD ADMINISTERED:none  DRAINS: none   LOCAL MEDICATIONS USED:  NONE  SPECIMEN:  No Specimen  DISPOSITION OF SPECIMEN:  N/A  COUNTS:  YES  TOURNIQUET:  * No tourniquets in log *  DICTATION: .Other Dictation: Dictation Number [id number]  PLAN OF CARE: Admit to inpatient   PATIENT DISPOSITION:  PACU - hemodynamically stable.   Delay start of Pharmacological VTE agent (>24hrs) due to surgical blood loss or risk of bleeding: no

## 2016-10-30 NOTE — Consult Note (Signed)
Orthopaedic Trauma Service (OTS) Consult   Reason for Consult: Complex right distal tibia and fibular fractures Referring Physician:  [name], MD (ortho)   HPI: Johnathan Ross is an 46 y.o. black male history of insulin dependent diabetes (insulin pump) and MS who sustained a ground-level fall yesterday evening. Patient was out in his driveway which is made of gravel, he was already tired and sustained a fall. Patient had immediate onset of pain in his right ankle and inability to bear weight. He was brought to the [facility] for evaluation. He was found to have a complex right distal tibia and fibula fracture. Orthopedics on call was consult. Patient was then transferred to [place] for admission and evaluation. Patient was seen and evaluated by Dr. Lyla Ross in the emergency department. He was admitted orthopedics for pain control and for evaluation by the orthopedic trauma service. Due to the complexity of injury Dr. Lyla Ross asserted that this injury was at a scope of his practice and it was necessary for orthopedic trauma fellowship trained surgeon to manage. Patient was made npo after midnight for surgery on 10/30/2016 for possible external fixation versus ORIF of his right distal tibia and fibula.  Patient seen and evaluated by the orthopedic trauma service in the OR holding area he is quite comfortable. His family is at bedside. Denies any additional acute injuries. Denies any new numbness or tingling in his right ankle or foot. He did break his left ankle about 3 years ago which was fixed operatively by Dr. [name]. He states that he recently finished another treatment for his MS. He states that he does not use any assistive devices for ambulation however family feels that he should at least use a cane. He does lose his balance quite often. Patient denies any chest pain or shortness of breath no nausea or vomiting no abdominal pain no headaches or lightheadedness.  Patient did not strike his head or lose consciousness prior during or after his fall.  Patient was placed into a short posterior splint were transfer from urgent care to [facility] ED   Imaging of his right ankle and knee demonstrates a largely extra-articular right distal tibia fracture with medial malleolus component. There is also impaction and comminution present. There is a oblique fracture of the right distal fibula along with an oblique fracture of the proximal fibula   Patient notes that his blood sugars are routinely in the 150s  Past Medical History:  Diagnosis Date  . Hyperlipidemia   . Hypertension   . Hypothyroidism   . Multiple sclerosis ([place])   . 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 Type 1 diabetes mellitus ([name])     Past Surgical History:  Procedure Laterality Date  . ORIF ANKLE FRACTURE Left 2015    Family History  Problem Relation Age of Onset  . Diabetes Sister   . Colon cancer Maternal Grandmother   . Colon polyps Maternal Uncle     Social History:  reports that he has never smoked. He has never used smokeless tobacco. He reports that he does not drink alcohol or use drugs.   Patient disability due to his MS  Allergies: No Known Allergies  Medications:  I have reviewed the patient's current medications. Prior to Admission:  Prescriptions Prior to Admission  Medication Sig Dispense Refill Last Dose  . dalfampridine (AMPYRA) 10 MG TB12 Take 10 mg by mouth See admin instructions. Take one tablet (10 mg) by mouth every morning and mid  afternoon   10/29/2016 at 1400  . Insulin Human (INSULIN PUMP) SOLN Inject into the skin continuous. Novolog   10/29/2016 at Unknown time  . levothyroxine (SYNTHROID) 125 MCG tablet Take 1 tablet (125 mcg total) by mouth daily before breakfast. (Patient taking differently: Take 125 mcg by mouth at bedtime. ) 30 tablet 3 10/28/2016 at pm  . lisinopril (PRINIVIL,ZESTRIL) 10 MG tablet TAKE 1 TABLET BY MOUTH EVERY DAY (Patient  taking differently: TAKE 1 TABLET BY MOUTH DAILY AT BEDTIME) 90 tablet 0 10/28/2016 at pm  . simvastatin (ZOCOR) 20 MG tablet TAKE 1 TABLET BY MOUTH AT BEDTIME (Patient taking differently: TAKE 1 TABLET (20 MG) BY MOUTH AT BEDTIME) 90 tablet 1 10/28/2016 at pm  . glucose blood (BAYER CONTOUR NEXT TEST) test strip USE AS INSTRUCTED TO CHECK BLOOD SUGARS 8 TIMES PER DAY DX CODE E10.65 300 each 3 Taking  . magic mouthwash SOLN 1 tablespoon 3 times a day to swish and discard. (Patient not taking: Reported on 10/29/2016) 10 mL 0 Not Taking  . NOVOLOG 100 UNIT/ML injection INJECT 80 UNITS INTO THE SKIN ONCE. (Patient not taking: Reported on 10/29/2016) 60 mL 1 Not Taking at Unknown time    Results for orders placed or performed during the hospital encounter of 10/29/16 (from the past 48 hour(s))  CBG monitoring, ED     Status: Abnormal   Collection Time: 10/29/16  9:03 PM  Result Value Ref Range   Glucose-Capillary 184 (H) 65 - 99 mg/dL  Type and screen     Status: None   Collection Time: 10/29/16  9:20 PM  Result Value Ref Range   ABO/RH(D) A POS    Antibody Screen NEG    Sample Expiration 11/01/2016   CBC     Status: Abnormal   Collection Time: 10/29/16  9:40 PM  Result Value Ref Range   WBC 15.0 (H) 4.0 - 10.5 K/uL   RBC 4.99 4.22 - 5.81 MIL/uL   Hemoglobin 14.6 13.0 - 17.0 g/dL   HCT 43.4 39.0 - 52.0 %   MCV 87.0 78.0 - 100.0 fL   MCH 29.3 26.0 - 34.0 pg   MCHC 33.6 30.0 - 36.0 g/dL   RDW 13.3 11.5 - 15.5 %   Platelets 274 150 - 400 K/uL  Basic metabolic panel     Status: Abnormal   Collection Time: 10/29/16  9:40 PM  Result Value Ref Range   Sodium 135 135 - 145 mmol/L   Potassium 4.8 3.5 - 5.1 mmol/L   Chloride 101 101 - 111 mmol/L   CO2 24 22 - 32 mmol/L   Glucose, Bld 214 (H) 65 - 99 mg/dL   BUN 13 6 - 20 mg/dL   Creatinine, Ser 1.28 (H) 0.61 - 1.24 mg/dL   Calcium 9.5 8.9 - 10.3 mg/dL   GFR calc non Af Amer >60 >60 mL/min   GFR calc Af Amer >60 >60 mL/min    Comment: (NOTE) The  eGFR has been calculated using the CKD EPI equation. This calculation has not been validated in all clinical situations. eGFR's persistently <60 mL/min signify possible Chronic Kidney Disease.    Anion gap 10 5 - 15  ABO/Rh     Status: None   Collection Time: 10/29/16  9:40 PM  Result Value Ref Range   ABO/RH(D) A POS   Glucose, capillary     Status: Abnormal   Collection Time: 10/29/16 11:21 PM  Result Value Ref Range   Glucose-Capillary 194 (H) 65 -  99 mg/dL  Surgical pcr screen     Status: None   Collection Time: 10/29/16 11:58 PM  Result Value Ref Range   MRSA, PCR NEGATIVE NEGATIVE   Staphylococcus aureus NEGATIVE NEGATIVE    Comment:        The Xpert SA Assay (FDA approved for NASAL specimens in patients over 6 years of age), is one component of a comprehensive surveillance program.  Test performance has been validated by [facility] for patients greater than or equal to 57 year old. It is not intended to diagnose infection nor to guide or monitor treatment.   Glucose, capillary     Status: Abnormal   Collection Time: 10/30/16 12:40 AM  Result Value Ref Range   Glucose-Capillary 213 (H) 65 - 99 mg/dL  Glucose, capillary     Status: Abnormal   Collection Time: 10/30/16  4:21 AM  Result Value Ref Range   Glucose-Capillary 166 (H) 65 - 99 mg/dL  Glucose, capillary     Status: Abnormal   Collection Time: 10/30/16  7:33 AM  Result Value Ref Range   Glucose-Capillary 215 (H) 65 - 99 mg/dL   Comment 1 Notify RN   Glucose, capillary     Status: Abnormal   Collection Time: 10/30/16  8:22 AM  Result Value Ref Range   Glucose-Capillary 249 (H) 65 - 99 mg/dL    Dg Tibia/fibula Right  Result Date: 10/29/2016 CLINICAL DATA:  At post fall. EXAM: RIGHT TIBIA AND FIBULA - 2 VIEW COMPARISON:  None. FINDINGS: Spiral fracture the distal tibial metaphysis extending to the medial apophysis and the articular surface. 4 mm of lateral displacement. Oblique fracture of the distal  fibular metaphysis with 6 mm of posterior displacement. Nondisplaced oblique fracture of the proximal fibular metadiaphysis. No lytic or sclerotic osseous lesion. Soft tissue swelling over the medial and lateral malleolus. IMPRESSION: 1. Spiral fracture the distal tibial metaphysis extending to the medial apophysis and the articular surface. 4 mm of lateral displacement. 2. Oblique fracture of the distal fibular metaphysis with 6 mm of posterior displacement. 3. Nondisplaced oblique fracture of the proximal fibular metadiaphysis. Electronically Signed   By: Kathreen Devoid   On: 10/29/2016 20:09   Dg Ankle Complete Right  Result Date: 10/29/2016 CLINICAL DATA:  Pain after right ankle twisting injury. EXAM: RIGHT ANKLE - COMPLETE 3+ VIEW COMPARISON:  None. FINDINGS: Acute, closed intra-articular fractures of the distal tibial diaphysis and metaphysis extending into the ankle mortise just medial to the medial malleolus is noted. There is slight lateral displacement of the distal fracture fragment approximately 5 mm. Acute coronal oblique fracture of the distal fibular diaphysis is also noted with lateral and slight dorsal displacement of the distal fracture fragment also extending into the ankle joint. No widening of the ankle mortise. The subtalar and midfoot articulations are maintained. Vascular calcifications are seen about the ankle. Soft tissue swelling is noted about the malleoli. Small ankle joint effusion. IMPRESSION: Acute, closed intra-articular fractures of the distal tibia and fibula extending into the ankle joint without mortise widening. Slight lateral and dorsal displacement of the distal fibular fracture fragment is noted. Slight lateral displacement of the distal tibial fracture fragment up to 5 mm is also seen. Electronically Signed   By: Ashley Royalty M.D.   On: 10/29/2016 19:02   Ct Ankle Right Wo Contrast  Result Date: 10/30/2016 CLINICAL DATA:  Closed fracture of the right tibial plafond with  fibular involvement. EXAM: CT OF THE RIGHT ANKLE WITHOUT CONTRAST  TECHNIQUE: Multidetector CT imaging of the right ankle was performed according to the standard protocol. Multiplanar CT image reconstructions were also generated. COMPARISON:  10/29/2016 radiographs of the right ankle FINDINGS: Bones/Joint/Cartilage 1. Acute closed comminuted fracture of the distal right tibial diaphysis, predominantly coronal oblique in orientation with more anterior sagittal oblique fracture involving the anteromedial corner of the tibial metaphysis and epiphysis, series 8, image 26. The tibial fracture enters the ankle joint sagittally just medial to the medial malleolus, series 3, image 55. There is 4 mm of lateral displacement of the main tibial distal fracture fragment and 3 mm of anterior displacement of the anteromedial tibial fracture fragment. 2. Acute closed oblique fracture of the distal fibular metaphysis with slight valgus angulation of the distal fracture fragment. Fracture extends into the ankle joint. The ankle and subtalar joints are maintained as are the midfoot articulations. No significant widening of the ankle mortise. A small bone island is noted of the navicular. Ligaments Suboptimally assessed by CT. Muscles and Tendons No intramuscular hemorrhage. No muscle atrophy. The mediolateral tendons crossing the ankle joint do not appear entrapped. The extensor tendons are unremarkable the outlined by edema. Soft tissues Diffuse circumferential soft tissue edema secondary to fracture. No hematoma. IMPRESSION: 1. Acute closed comminuted fracture of the distal right tibial diaphysis, predominantly coronal oblique in orientation with more anterior sagittal oblique fracture involving the anteromedial corner of the tibial metaphysis and epiphysis, series 8, image 26. The tibial fracture enters the ankle joint sagittally just medial to the medial malleolus, series 3, image 55. There is 4 mm of lateral displacement of the  main tibial distal fracture fragment and 3 mm of anterior displacement of the anteromedial tibial fracture fragment. 2. Acute closed oblique fracture of the distal fibular metaphysis with slight valgus angulation of the distal fracture fragment. Fracture extends into the ankle joint. 3. No entrapment of the tendons crossing the ankle joint. 4. No significant widening of the ankle mortise. 5. Circumferential soft tissue edema and swelling about the ankle and visualized distal leg. Electronically Signed   By: Ashley Royalty M.D.   On: 10/30/2016 00:45    Review of Systems  Constitutional: Negative for chills and fever.  Respiratory: Negative for shortness of breath and wheezing.   Cardiovascular: Negative for chest pain and palpitations.  Gastrointestinal: Negative for abdominal pain, nausea and vomiting.  Neurological: Negative for tingling and sensory change.   Blood pressure (!) 115/54, pulse 93, temperature 99.1 F (37.3 C), temperature source Oral, resp. rate 17, height '6\' 1"'  (1.854 m), weight 97.4 kg (214 lb 11.2 oz), SpO2 99 %. Physical Exam  Constitutional: He is oriented to person, place, and time. He appears well-developed and well-nourished. He is sleeping and cooperative. No distress.  Eyes: EOM are normal. Pupils are equal, round, and reactive to light.  Cardiovascular: Normal rate, regular rhythm, S1 normal and S2 normal.   Pulmonary/Chest: No accessory muscle usage. No respiratory distress.  Clear fields bilaterally  Abdominal:  Protuberant + Bowel sounds  Musculoskeletal:  Right lower extremity Inspection:    Short-leg posterior splint to the right lower extremity    Ace wrap has been removed. The fiberglass slabs are held in place with Kerlix     Knee and hip are unremarkable Bony eval:     Did not remove the splint completely, tenderness along distal tibia and distal fibula     Minimal tenderness along the proximal fibula     No tenderness over his proximal tibia, distal  femur or patella  Soft tissue:    Marked swelling to the right ankle     Did not remove splint completely but skin with minimal wrinkling. Did not appreciate any open wounds were fracture blisters from the soft tissue that was evaluated.     No knee effusion noted     Thigh and hip are unremarkable  ROM:     Not assessed due to fracture  Sensation:    DPN, SPN, TN sensory functions grossly intact  Motor:    EHL, FHL, lesser toe motor functions grossly intact  Vascular:    Extremity is warm     + DP pulse     Compartments are soft     No pain out of proportion with passive stretching of his lower leg compartments   Left lower extremity  No traumatic wounds, ecchymosis, or rash  Nontender  No effusions             Hip is unremarkable and without acute findings. No pain with axial loading or logrolling  Knee stable to varus/ valgus and anterior/posterior stress             Ankle is stable and evaluated and found. Healed surgical noted  Sens DPN, SPN, TN intact  Motor EHL, ext, flex, evers 5/5  DP 2+, PT 2+, No significant edema       Neurological: He is alert and oriented to person, place, and time.  Psychiatric: He has a normal mood and affect. His speech is normal.  Nursing note and vitals reviewed.    Assessment/Plan:  46 year old male insulin-dependent diabetes, MS, HTN, s/p ground level fall with comminuted right distal tibia and fibula fracture  - Right tibial plafond fracture, primarily extra-articular, R medial malleolus fracture, R distal fibula fracture and R proximal fibula fracture, possible syndesmotic disruption   The patient will need surgical intervention to address his fracture.  We will fully evaluate the extent of his soft tissue swelling in the OR. We may need to proceed with external fixation first followed by delayed ORIF. Can also consider definitive treatment with the external fixator as well.   The syndesmosis also needs to be evaluated   We are  concerned with the increased risk for infection and nonunion given his insulin-dependent diabetes. We did have extensive discussion about the importance of keeping his blood sugars below 200. This will optimize his ability to heal and decrease the chances of infection.   He will be nonweightbearing for 8 weeks after definitive surgery.   PT and OT evaluations postoperatively. Patient is walker station. Sounds like he needs to strongly consider using a cane once he is fully recovered   - Pain management:  Adjust accordingly postoperatively  - ABL anemia/Hemodynamics  Stable  - Medical issues   Insulin-dependent diabetes   Resume insulin pump postoperatively   We will check A1c postoperatively   Diabetes coordinator eval   - DVT/PE prophylaxis:  Lovenox postop  - ID:   Perioperative Ancef  - Metabolic Bone Disease:  Due to his insulin and diabetes we will check extensive labs to evaluate her bone health. Suspect that he has very poor bone quality due to his insulin deficiency/lack of endogenous insulin  - Activity:  Nonweightbearing right leg  - FEN/GI prophylaxis/Foley/Lines:  Currently npo   Resume diet postoperatively  IV fluids postop  - Dispo:  OR this morning to address his right distal tibia and fibula fractures   Jari Pigg, PA-C [facility] [phone] (P) 10/30/2016, 8:47 AM

## 2016-10-30 NOTE — Progress Notes (Addendum)
Inpatient Diabetes Program Recommendations  AACE/ADA: New Consensus Statement on Inpatient Glycemic Control (2015)  Target Ranges:  Prepandial:   less than 140 mg/dL      Peak postprandial:   less than 180 mg/dL (1-2 hours)      Critically ill patients:  140 - 180 mg/dL   Lab Results  Component Value Date   GLUCAP 249 (H) 10/30/2016   HGBA1C 8.0 (H) 07/06/2016    Review of Glycemic Control:  Results for CAMDAN, BURDI (MRN [id number]) as of 10/30/2016 09:20  Ref. Range 10/30/2016 04:21 10/30/2016 07:33 10/30/2016 08:22  Glucose-Capillary Latest Ref Range: 65 - 99 mg/dL 166 (H) 215 (H) 249 (H)    Diabetes history: Type 1 diabetes Outpatient Diabetes medications: Insulin pump-see's Dr. Dwyane Dee- per last note from Dr. Dwyane Dee insulin pump settings are: PUMP settings are basal rate per visit on 09/11/16-  1.5 until 4 a.m; 4 AM = 1.25. 6 a.m.= 1.5. 9 AM = 1.35,11 a.m. = 0.9. 12 noon = 1.2, 2 p.m. = 1.55. 3 PM = 2.15 and 6 p.m. = 1.8  Carbohydrate to insulin ratio: 1: 10, at 11 a.m. = 1: 10, after 6 p.m. = 1:7, hyperglycemia correction factor: 1: 20,and after 10 p.m. 1:30, target 120, active insulin time 4 hours.   Inpatient Diabetes Program Recommendations:    Received call from pharmacist regarding patient and insulin pump.  Note that patient is currently in surgery however unclear whether insulin pump is off or on.  Pharmacist to call back to let CRNA know that if insulin pump is off, patient will need insulin drip per Glucostabilizer.  Called to discuss with RN on 6N and she states that patient took off insulin pump at 6:45 am.  Note that patient now has orders for IV insulin.  Will follow and assist with transition back to insulin pump when appropriate.   Thanks,  Adah Perl, RN, BC-ADM Inpatient Diabetes Coordinator Pager [phone] (8a-5p)

## 2016-10-31 ENCOUNTER — Encounter (HOSPITAL_COMMUNITY): Payer: Self-pay | Admitting: Orthopedic Surgery

## 2016-10-31 LAB — COMPREHENSIVE METABOLIC PANEL
ALT: 20 U/L (ref 17–63)
AST: 27 U/L (ref 15–41)
Albumin: 3.8 g/dL (ref 3.5–5.0)
Alkaline Phosphatase: 83 U/L (ref 38–126)
Anion gap: 10 (ref 5–15)
BUN: 11 mg/dL (ref 6–20)
CO2: 25 mmol/L (ref 22–32)
Calcium: 8.6 mg/dL — ABNORMAL LOW (ref 8.9–10.3)
Chloride: 96 mmol/L — ABNORMAL LOW (ref 101–111)
Creatinine, Ser: 1.37 mg/dL — ABNORMAL HIGH (ref 0.61–1.24)
GFR calc Af Amer: >60 mL/min (ref >60)
GFR calc non Af Amer: >60 mL/min (ref >60)
Glucose, Bld: 250 mg/dL — ABNORMAL HIGH (ref 65–99)
Potassium: 4.5 mmol/L (ref 3.5–5.1)
Sodium: 131 mmol/L — ABNORMAL LOW (ref 135–145)
Total Bilirubin: 0.7 mg/dL (ref 0.3–1.2)
Total Protein: 6.3 g/dL — ABNORMAL LOW (ref 6.5–8.1)

## 2016-10-31 LAB — CBC
HCT: 38.4 % — ABNORMAL LOW (ref 39.0–52.0)
Hemoglobin: 12.9 g/dL — ABNORMAL LOW (ref 13.0–17.0)
MCH: 29.5 pg (ref 26.0–34.0)
MCHC: 33.6 g/dL (ref 30.0–36.0)
MCV: 87.7 fL (ref 78.0–100.0)
Platelets: 234 10*3/uL (ref 150–400)
RBC: 4.38 MIL/uL (ref 4.22–5.81)
RDW: 13.1 % (ref 11.5–15.5)
WBC: 13.8 10*3/uL — ABNORMAL HIGH (ref 4.0–10.5)

## 2016-10-31 LAB — OSMOLALITY: Osmolality: 294 mOsm/kg (ref 275–295)

## 2016-10-31 LAB — GLUCOSE, CAPILLARY
Glucose-Capillary: 225 mg/dL — ABNORMAL HIGH (ref 65–99)
Glucose-Capillary: 331 mg/dL — ABNORMAL HIGH (ref 65–99)
Glucose-Capillary: 228 mg/dL — ABNORMAL HIGH (ref 65–99)
Glucose-Capillary: 276 mg/dL — ABNORMAL HIGH (ref 65–99)
Glucose-Capillary: 260 mg/dL — ABNORMAL HIGH (ref 65–99)
Glucose-Capillary: 166 mg/dL — ABNORMAL HIGH (ref 65–99)

## 2016-10-31 LAB — TSH: TSH: 3.653 u[IU]/mL (ref 0.350–4.500)

## 2016-10-31 LAB — PREALBUMIN: Prealbumin: 15.9 mg/dL — ABNORMAL LOW (ref 18–38)

## 2016-10-31 LAB — PHOSPHORUS: Phosphorus: 3.6 mg/dL (ref 2.5–4.6)

## 2016-10-31 LAB — MAGNESIUM: Magnesium: 1.6 mg/dL — ABNORMAL LOW (ref 1.7–2.4)

## 2016-10-31 NOTE — Progress Notes (Signed)
Orthopedic Trauma Service Progress Note    Subjective:  Doing well this am  Pain tolerable  No increase in pain from pre-op  cbg's are high CBG (last 3)   Recent Labs  10/30/16 2156 10/31/16 0307 10/31/16 0749  GLUCAP 239* 331* 228*    Pt has made adjustments to insulin pump DM coordinator following as well   Review of Systems  Constitutional: Negative for chills and fever.  Respiratory: Negative for shortness of breath.   Cardiovascular: Negative for chest pain and palpitations.  Gastrointestinal: Negative for abdominal pain, nausea and vomiting.    Objective:   VITALS:   Vitals:   10/30/16 1335 10/30/16 2130 10/31/16 0142 10/31/16 0532  BP: (!) 146/82 (!) 148/82 131/70 (!) 152/66  Pulse: 89 88 97 92  Resp: 14 17 17 18   Temp: 98.2 F (36.8 C) 98.4 F (36.9 C) 98.6 F (37 C) 98.5 F (36.9 C)  TempSrc: Oral Oral    SpO2: 100% 100% 97% 95%  Weight:      Height:        Intake/Output      06/05 0701 - 06/06 0700 06/06 0701 - 06/07 0700   P.O. 600    I.V. (mL/kg) 2623.4 (26.9)    IV Piggyback 150    Total Intake(mL/kg) 3373.4 (34.6)    Urine (mL/kg/hr) 1825 (0.8)    Blood 75 (0)    Total Output 1900     Net +1473.4          Urine Occurrence 1 x      LABS  Results for orders placed or performed during the hospital encounter of 10/29/16 (from the past 24 hour(s))  Glucose, capillary     Status: Abnormal   Collection Time: 10/30/16  9:38 AM  Result Value Ref Range   Glucose-Capillary 306 (H) 65 - 99 mg/dL  Glucose, capillary     Status: Abnormal   Collection Time: 10/30/16 10:39 AM  Result Value Ref Range   Glucose-Capillary 239 (H) 65 - 99 mg/dL   Comment 1 Glucose Stabilizer    Comment 2 Document in Chart   Glucose, capillary     Status: Abnormal   Collection Time: 10/30/16 12:25 PM  Result Value Ref Range   Glucose-Capillary 180 (H) 65 - 99 mg/dL   Comment 1 Document in Chart   Glucose, capillary     Status:  Abnormal   Collection Time: 10/30/16  1:55 PM  Result Value Ref Range   Glucose-Capillary 150 (H) 65 - 99 mg/dL   Comment 1 Notify RN   CBC     Status: Abnormal   Collection Time: 10/30/16  2:07 PM  Result Value Ref Range   WBC 12.9 (H) 4.0 - 10.5 K/uL   RBC 4.77 4.22 - 5.81 MIL/uL   Hemoglobin 14.4 13.0 - 17.0 g/dL   HCT 41.4 39.0 - 52.0 %   MCV 86.8 78.0 - 100.0 fL   MCH 30.2 26.0 - 34.0 pg   MCHC 34.8 30.0 - 36.0 g/dL   RDW 13.3 11.5 - 15.5 %   Platelets 250 150 - 400 K/uL  Creatinine, serum     Status: Abnormal   Collection Time: 10/30/16  2:07 PM  Result Value Ref Range   Creatinine, Ser 1.30 (H) 0.61 - 1.24 mg/dL   GFR calc non Af Amer >60 >60 mL/min   GFR calc Af Amer >60 >60 mL/min  Glucose, capillary     Status: Abnormal   Collection Time: 10/30/16  3:07 PM  Result Value Ref Range   Glucose-Capillary 123 (H) 65 - 99 mg/dL  Glucose, capillary     Status: Abnormal   Collection Time: 10/30/16  4:05 PM  Result Value Ref Range   Glucose-Capillary 185 (H) 65 - 99 mg/dL   Comment 1 Notify RN   Glucose, capillary     Status: Abnormal   Collection Time: 10/30/16  5:36 PM  Result Value Ref Range   Glucose-Capillary 268 (H) 65 - 99 mg/dL   Comment 1 Notify RN   Glucose, capillary     Status: Abnormal   Collection Time: 10/30/16  9:56 PM  Result Value Ref Range   Glucose-Capillary 239 (H) 65 - 99 mg/dL   Comment 1 Notify RN    Comment 2 Document in Chart   Glucose, capillary     Status: Abnormal   Collection Time: 10/31/16  3:07 AM  Result Value Ref Range   Glucose-Capillary 331 (H) 65 - 99 mg/dL   Comment 1 Notify RN    Comment 2 Document in Chart   TSH     Status: None   Collection Time: 10/31/16  4:15 AM  Result Value Ref Range   TSH 3.653 0.350 - 4.500 uIU/mL  Prealbumin     Status: Abnormal   Collection Time: 10/31/16  4:15 AM  Result Value Ref Range   Prealbumin 15.9 (L) 18 - 38 mg/dL  Magnesium     Status: Abnormal   Collection Time: 10/31/16  4:15 AM   Result Value Ref Range   Magnesium 1.6 (L) 1.7 - 2.4 mg/dL  Phosphorus     Status: None   Collection Time: 10/31/16  4:15 AM  Result Value Ref Range   Phosphorus 3.6 2.5 - 4.6 mg/dL  Comprehensive metabolic panel     Status: Abnormal   Collection Time: 10/31/16  4:15 AM  Result Value Ref Range   Sodium 131 (L) 135 - 145 mmol/L   Potassium 4.5 3.5 - 5.1 mmol/L   Chloride 96 (L) 101 - 111 mmol/L   CO2 25 22 - 32 mmol/L   Glucose, Bld 250 (H) 65 - 99 mg/dL   BUN 11 6 - 20 mg/dL   Creatinine, Ser 1.37 (H) 0.61 - 1.24 mg/dL   Calcium 8.6 (L) 8.9 - 10.3 mg/dL   Total Protein 6.3 (L) 6.5 - 8.1 g/dL   Albumin 3.8 3.5 - 5.0 g/dL   AST 27 15 - 41 U/L   ALT 20 17 - 63 U/L   Alkaline Phosphatase 83 38 - 126 U/L   Total Bilirubin 0.7 0.3 - 1.2 mg/dL   GFR calc non Af Amer >60 >60 mL/min   GFR calc Af Amer >60 >60 mL/min   Anion gap 10 5 - 15  CBC     Status: Abnormal   Collection Time: 10/31/16  4:15 AM  Result Value Ref Range   WBC 13.8 (H) 4.0 - 10.5 K/uL   RBC 4.38 4.22 - 5.81 MIL/uL   Hemoglobin 12.9 (L) 13.0 - 17.0 g/dL   HCT 38.4 (L) 39.0 - 52.0 %   MCV 87.7 78.0 - 100.0 fL   MCH 29.5 26.0 - 34.0 pg   MCHC 33.6 30.0 - 36.0 g/dL   RDW 13.1 11.5 - 15.5 %   Platelets 234 150 - 400 K/uL  Glucose, capillary     Status: Abnormal   Collection Time: 10/31/16  7:49 AM  Result Value Ref Range   Glucose-Capillary 228 (H) 65 -  99 mg/dL   Comment 1 Document in Chart      PHYSICAL EXAM:   Gen: sitting up in bed, NAD, appears well Lungs: clear B  Cardiac:  RRR, s1 and s2 Abd: + BS Ext:       Right Lower Extremity   Dressing and splint c/d/i   Ext warm  + DP pulse  EHL, FHL, lesser toe motor grossly intact  DPN, SPN, TN sensation intact   Swelling stable   No pain with passive stretch of toes   Assessment/Plan: 1 Day Post-Op   Principal Problem:   Closed fracture of right tibial plafond with fibula involvement Active Problems:   Closed tibia  fracture   Anti-infectives    Start     Dose/Rate Route Frequency Ordered Stop   10/30/16 1500  ceFAZolin (ANCEF) IVPB 1 g/50 mL premix     1 g 100 mL/hr over 30 Minutes Intravenous Every 6 hours 10/30/16 1350 10/31/16 0355   10/30/16 0830  ceFAZolin (ANCEF) IVPB 2g/100 mL premix     2 g 200 mL/hr over 30 Minutes Intravenous  Once 10/30/16 0824 10/30/16 0937   10/30/16 0827  ceFAZolin (ANCEF) 2-4 GM/100ML-% IVPB    Comments:  Ernesta Amble   : cabinet override      10/30/16 0827 10/30/16 0907    .  POD/HD#: 81   46 year old male insulin-dependent diabetes, MS, HTN, s/p ground level fall with comminuted right distal tibia and fibula fracture   - Right tibial plafond fracture, primarily extra-articular, R medial malleolus fracture, R distal fibula fracture and R proximal fibula fracture, stable syndesmosis s/p ORIF     NWB R LEx x 8 weeks  Splint x 2 weeks then begin ankle ROM  Unrestricted Knee ROM   PT/OT please teach HEP for R LEx  Ice and elevate  Up with assistance   Will try to obtain knee scooter for home      - Pain management:             continue with current regimen   - ABL anemia/Hemodynamics             Stable   - Medical issues              Insulin-dependent diabetes                         insulin pump   Pt making necessary adjustments   - DVT/PE prophylaxis:             Lovenox x 21 days   - ID:              Perioperative Ancef   - Metabolic Bone Disease:             labs pending    - Activity:             Nonweightbearing right leg   - FEN/GI prophylaxis/Foley/Lines:             CHO mod diet  Continue IVF   Mild hyponatremia   Check serum osmolality    - Dispo:             PT/OT   Hopeful for dc home tomorrow if sugar control is better (<200)    Jari Pigg, PA-C [facility] [phone] (P) [phone] (O) 10/31/2016, 9:07 AM

## 2016-10-31 NOTE — Care Management Note (Signed)
Case Management Note  Patient Details  Name: Johnathan Ross MRN: [id number] Date of Birth: 1971/02/12  Subjective/Objective:                    Action/Plan:  Confirmed face sheet information with patient.   Patient has had knee scooter before and is aware he needs prescription and family can go to [facility] store [place] . Patient has had scooter before and family aware [place] location.   Provided list of home health agencies . Patient will discuss with his daughter tonight and make decision on which agency tomorrow.   Await PT eval for any further DME needs.  Expected Discharge Date:                  Expected Discharge Plan:  [place]  In-House Referral:     Discharge planning Services  CM Consult  Post Acute Care Choice:  Durable Medical Equipment, Home Health Choice offered to:  Patient, Spouse  DME Arranged:    DME Agency:     HH Arranged:  PT HH Agency:     Status of Service:  In process, will continue to follow  If discussed at Long Length of Stay Meetings, dates discussed:    Additional Comments:  Marilu Favre, RN 10/31/2016, 9:48 AM

## 2016-10-31 NOTE — Progress Notes (Signed)
Inpatient Diabetes Program Recommendations  AACE/ADA: New Consensus Statement on Inpatient Glycemic Control (2015)  Target Ranges:  Prepandial:   less than 140 mg/dL      Peak postprandial:   less than 180 mg/dL (1-2 hours)      Critically ill patients:  140 - 180 mg/dL   Lab Results  Component Value Date   GLUCAP 331 (H) 10/31/2016   HGBA1C 8.0 (H) 07/06/2016    Review of Glycemic ControlResults for DATRELL, DUNTON (MRN [id number]) as of 10/31/2016 06:38  Ref. Range 10/30/2016 17:36 10/30/2016 21:56 10/31/2016 03:07  Glucose-Capillary Latest Ref Range: 65 - 99 mg/dL 268 (H) 239 (H) 331 (H)   See previous note regarding pump settings.  Note that blood sugars increased overnight.  Patient may need to change out insulin pump site/insulin in pump to ensure that insulin pump working properly.  Will assess 8 am CBG and discuss with RN.    Thanks, Adah Perl, RN, BC-ADM Inpatient Diabetes Coordinator Pager [phone] (8a-5p)

## 2016-10-31 NOTE — Evaluation (Signed)
Occupational Therapy Evaluation Patient Details Name: Johnathan Ross MRN: [id number] DOB: 1970/09/17 Today's Date: 10/31/2016    History of Present Illness Pt is a 46 y.o. male s/p ORIF R pilon fracture, tibia and fibula. PMHx: HTN, Multiple sclerosis, Proliferative diabetic retinopathy, DM 1.    Clinical Impression   Pt reports he was independent with ADL PTA. Currently pt overall min guard assist for functional mobility and mod-max assist for LB ADL. Educated pt on RLE NWB; pt able to maintain NWB throughout all activity. Pt planning to d/c home with supervision from family. Pt would benefit from continued skilled OT to address established goals.    Follow Up Recommendations  No OT follow up;Supervision/Assistance - 24 hour (initially)    Equipment Recommendations  3 in 1 bedside commode    Recommendations for Other Services PT consult     Precautions / Restrictions Precautions Precautions: Fall Restrictions Weight Bearing Restrictions: Yes RLE Weight Bearing: Non weight bearing      Mobility Bed Mobility Overal bed mobility: Needs Assistance Bed Mobility: Supine to Sit     Supine to sit: Min assist     General bed mobility comments: Min assist for RLE to EOB, pt able to raise trunk to sitting and scoot hips out to EOB  Transfers Overall transfer level: Needs assistance Equipment used: Rolling walker (2 wheeled) Transfers: Sit to/from Stand Sit to Stand: Min guard         General transfer comment: Min guard for safety, cues for hand placement and technique with RW. Pt able to maintain NWB throughout    Balance Overall balance assessment: Needs assistance Sitting-balance support: Feet supported;No upper extremity supported Sitting balance-Leahy Scale: Good     Standing balance support: Bilateral upper extremity supported Standing balance-Leahy Scale: Poor Standing balance comment: RW for support                           ADL either performed  or assessed with clinical judgement   ADL Overall ADL's : Needs assistance/impaired Eating/Feeding: Set up;Sitting   Grooming: Set up;Supervision/safety;Sitting   Upper Body Bathing: Set up;Supervision/ safety;Sitting   Lower Body Bathing: Moderate assistance;Sit to/from stand   Upper Body Dressing : Set up;Supervision/safety;Sitting   Lower Body Dressing: Maximal assistance;Sit to/from stand Lower Body Dressing Details (indicate cue type and reason): to don sock to L foot Toilet Transfer: Min guard;Ambulation;RW Toilet Transfer Details (indicate cue type and reason): Simulated by sit to stand from EOB with functional mobility in room         Functional mobility during ADLs: Min guard;Rolling walker General ADL Comments: Pt able to maintain NWB throughout functional mobility.      Vision         Perception     Praxis      Pertinent Vitals/Pain Pain Assessment: 0-10 Pain Score: 5  Pain Location: RLE Pain Descriptors / Indicators: Aching;Sore Pain Intervention(s): Monitored during session;Repositioned     Hand Dominance     Extremity/Trunk Assessment Upper Extremity Assessment Upper Extremity Assessment: Overall WFL for tasks assessed   Lower Extremity Assessment Lower Extremity Assessment: Defer to PT evaluation   Cervical / Trunk Assessment Cervical / Trunk Assessment: Normal   Communication Communication Communication: No difficulties   Cognition Arousal/Alertness: Awake/alert Behavior During Therapy: WFL for tasks assessed/performed Overall Cognitive Status: Within Functional Limits for tasks assessed  General Comments       Exercises     Shoulder Instructions      Home Living Family/patient expects to be discharged to:: Private residence Living Arrangements: Spouse/significant other Available Help at Discharge: Family Type of Home: House       Home Layout: One level     Bathroom  Shower/Tub: Tub/shower unit;Curtain   Biochemist, clinical: Standard     Home Equipment: None          Prior Functioning/Environment Level of Independence: Independent                 OT Problem List: Impaired balance (sitting and/or standing);Decreased knowledge of use of DME or AE;Decreased knowledge of precautions;Pain      OT Treatment/Interventions: Self-care/ADL training;DME and/or AE instruction;Energy conservation;Therapeutic activities;Balance training;Patient/family education    OT Goals(Current goals can be found in the care plan section) Acute Rehab OT Goals Patient Stated Goal: be as independent as possible OT Goal Formulation: With patient Time For Goal Achievement: 11/14/16 Potential to Achieve Goals: Good ADL Goals Pt Will Perform Lower Body Bathing: with supervision;sit to/from stand (with or without AE) Pt Will Perform Lower Body Dressing: with supervision;sit to/from stand (with or without AE) Pt Will Transfer to Toilet: with supervision;ambulating;bedside commode (over toilet) Pt Will Perform Toileting - Clothing Manipulation and hygiene: with supervision;sit to/from stand Pt Will Perform Tub/Shower Transfer: Tub transfer;with supervision;ambulating;3 in 1;rolling walker  OT Frequency: Min 2X/week   Barriers to D/C:            Co-evaluation              AM-PAC PT "6 Clicks" Daily Activity     Outcome Measure Help from another person eating meals?: None Help from another person taking care of personal grooming?: A Little Help from another person toileting, which includes using toliet, bedpan, or urinal?: A Little Help from another person bathing (including washing, rinsing, drying)?: A Lot Help from another person to put on and taking off regular upper body clothing?: A Little Help from another person to put on and taking off regular lower body clothing?: A Lot 6 Click Score: 17   End of Session Equipment Utilized During Treatment: Rolling  walker Nurse Communication: Mobility status;Weight bearing status  Activity Tolerance: Patient tolerated treatment well Patient left: in chair;with call bell/phone within reach;with family/visitor present  OT Visit Diagnosis: Other abnormalities of gait and mobility (R26.89);Pain;History of falling (Z91.81) Pain - Right/Left: Right Pain - part of body: Leg                Time: 0272-5366 OT Time Calculation (min): 15 min Charges:  OT General Charges $OT Visit: 1 Procedure OT Evaluation $OT Eval Moderate Complexity: 1 Procedure G-Codes:     Keonta Alsip A. Ulice Brilliant, M.S., OTR/L Pager: [place] 10/31/2016, 10:46 AM

## 2016-10-31 NOTE — Evaluation (Signed)
Physical Therapy Evaluation Patient Details Name: Johnathan Ross MRN: [id number] DOB: 1970/11/06 Today's Date: 10/31/2016   History of Present Illness  Pt is a 46 y.o. male s/p ORIF R pilon fracture, tibia and fibula. PMHx: HTN, Multiple sclerosis, Proliferative diabetic retinopathy, DM 1.   Clinical Impression  Pt admitted with above. Pt quickly fatigued, could be due to lethargy from medication or h/o MS. Pt functioning at min guard. Will need to complete stair negotiation prior to d/c home. Acute PT to con't to follow.    Follow Up Recommendations No PT follow up;Supervision - Intermittent    Equipment Recommendations  Rolling walker with 5" wheels    Recommendations for Other Services       Precautions / Restrictions Precautions Precautions: Fall Restrictions Weight Bearing Restrictions: Yes RLE Weight Bearing: Non weight bearing      Mobility  Bed Mobility Overal bed mobility: Needs Assistance Bed Mobility: Supine to Sit     Supine to sit: Min assist     General bed mobility comments: pt up in chair upon PT arrival  Transfers Overall transfer level: Needs assistance Equipment used: Rolling walker (2 wheeled) Transfers: Sit to/from Stand Sit to Stand: Min guard         General transfer comment: Min guard for safety, cues for hand placement and technique with RW. Pt able to maintain NWB throughout  Ambulation/Gait Ambulation/Gait assistance: Min guard Ambulation Distance (Feet): 20 Feet Assistive device: Rolling walker (2 wheeled) Gait Pattern/deviations: Step-to pattern Gait velocity: slow Gait velocity interpretation: Below normal speed for age/gender General Gait Details: pt with quick onset of fatigue, but able to maintain R LE NWB and no episodes of LOB  Stairs            Wheelchair Mobility    Modified Rankin (Stroke Patients Only)       Balance Overall balance assessment: Needs assistance Sitting-balance support: Feet supported;No  upper extremity supported Sitting balance-Leahy Scale: Good     Standing balance support: Bilateral upper extremity supported Standing balance-Leahy Scale: Poor Standing balance comment: RW for support                             Pertinent Vitals/Pain Pain Assessment: 0-10 Pain Score: 4  Pain Location: RLE Pain Descriptors / Indicators: Aching;Sore Pain Intervention(s): Monitored during session    Home Living Family/patient expects to be discharged to:: Private residence Living Arrangements: Spouse/significant other Available Help at Discharge: Family Type of Home: House Home Access: Stairs to enter Entrance Stairs-Rails: Can reach both Entrance Stairs-Number of Steps: 4 Home Layout: One level Home Equipment: Crutches      Prior Function Level of Independence: Independent               Hand Dominance        Extremity/Trunk Assessment   Upper Extremity Assessment Upper Extremity Assessment: Overall WFL for tasks assessed    Lower Extremity Assessment Lower Extremity Assessment: RLE deficits/detail RLE Deficits / Details: able to initiate quad set, unable to complete LAQ, can wiggle toes    Cervical / Trunk Assessment Cervical / Trunk Assessment: Normal  Communication   Communication: No difficulties  Cognition Arousal/Alertness: Awake/alert Behavior During Therapy: Flat affect Overall Cognitive Status: Within Functional Limits for tasks assessed  General Comments      Exercises General Exercises - Lower Extremity Quad Sets: AROM;Right;10 reps;Supine Gluteal Sets: AROM;Both;10 reps;Seated   Assessment/Plan    PT Assessment Patient needs continued PT services  PT Problem List Decreased strength;Decreased activity tolerance;Decreased balance;Decreased mobility       PT Treatment Interventions DME instruction;Gait training;Stair training;Functional mobility training;Therapeutic  activities;Therapeutic exercise;Balance training;Neuromuscular re-education    PT Goals (Current goals can be found in the Care Plan section)  Acute Rehab PT Goals Patient Stated Goal: be as independent as possible PT Goal Formulation: With patient Time For Goal Achievement: 11/07/16 Potential to Achieve Goals: Good    Frequency Min 4X/week   Barriers to discharge        Co-evaluation               AM-PAC PT "6 Clicks" Daily Activity  Outcome Measure Difficulty turning over in bed (including adjusting bedclothes, sheets and blankets)?: None Difficulty moving from lying on back to sitting on the side of the bed? : None Difficulty sitting down on and standing up from a chair with arms (e.g., wheelchair, bedside commode, etc,.)?: A Little Help needed moving to and from a bed to chair (including a wheelchair)?: A Little Help needed walking in hospital room?: A Little Help needed climbing 3-5 steps with a railing? : A Little 6 Click Score: 20    End of Session Equipment Utilized During Treatment: Gait belt Activity Tolerance: Patient limited by fatigue Patient left: in chair;with call bell/phone within reach;with family/visitor present Nurse Communication: Mobility status PT Visit Diagnosis: Unsteadiness on feet (R26.81);Difficulty in walking, not elsewhere classified (R26.2)    Time: 1201-1220 PT Time Calculation (min) (ACUTE ONLY): 19 min   Charges:   PT Evaluation $PT Eval Moderate Complexity: 1 Procedure     PT G CodesKittie Plater, PT, DPT Pager #: [phone] Office #: [phone]   [place] 10/31/2016, 12:33 PM

## 2016-10-31 NOTE — Progress Notes (Signed)
Visited patient to discuss rising blood sugars with insulin pump.  He has already changed site this morning.  He also states that he has increased his basal rates by 75%.  He states that his endocrinologist has told him to do this in the past when blood sugars increased.  Patient has Freestyle Libre continuous glucose monitor in place as well.  Instructed him to monitor for signs and symptoms of low blood sugars closely due to increase in basal rates.  He verbalized understanding.  Will follow.  Thanks, Adah Perl, RN, BC-ADM Inpatient Diabetes Coordinator Pager [phone] (8a-5p)

## 2016-11-01 ENCOUNTER — Encounter (HOSPITAL_COMMUNITY): Payer: Self-pay | Admitting: Orthopedic Surgery

## 2016-11-01 DIAGNOSIS — E039 Hypothyroidism, unspecified: Secondary | ICD-10-CM | POA: Diagnosis present

## 2016-11-01 DIAGNOSIS — E785 Hyperlipidemia, unspecified: Secondary | ICD-10-CM | POA: Diagnosis present

## 2016-11-01 DIAGNOSIS — E109 Type 1 diabetes mellitus without complications: Secondary | ICD-10-CM | POA: Diagnosis present

## 2016-11-01 DIAGNOSIS — E291 Testicular hypofunction: Secondary | ICD-10-CM | POA: Diagnosis present

## 2016-11-01 DIAGNOSIS — E1042 Type 1 diabetes mellitus with diabetic polyneuropathy: Secondary | ICD-10-CM | POA: Diagnosis present

## 2016-11-01 DIAGNOSIS — I10 Essential (primary) hypertension: Secondary | ICD-10-CM | POA: Diagnosis present

## 2016-11-01 HISTORY — DX: Testicular hypofunction: E29.1

## 2016-11-01 LAB — CBC
HCT: 34.6 % — ABNORMAL LOW (ref 39.0–52.0)
Hemoglobin: 11.5 g/dL — ABNORMAL LOW (ref 13.0–17.0)
MCH: 28.8 pg (ref 26.0–34.0)
MCHC: 33.2 g/dL (ref 30.0–36.0)
MCV: 86.5 fL (ref 78.0–100.0)
Platelets: 220 10*3/uL (ref 150–400)
RBC: 4 MIL/uL — ABNORMAL LOW (ref 4.22–5.81)
RDW: 12.9 % (ref 11.5–15.5)
WBC: 12.2 10*3/uL — ABNORMAL HIGH (ref 4.0–10.5)

## 2016-11-01 LAB — PTH, INTACT AND CALCIUM
Calcium, Total (PTH): 8.7 mg/dL (ref 8.7–10.2)
PTH: 44 pg/mL (ref 15–65)

## 2016-11-01 LAB — TESTOSTERONE, FREE: Testosterone, Free: 1.8 pg/mL — ABNORMAL LOW (ref 6.8–21.5)

## 2016-11-01 LAB — GLUCOSE, CAPILLARY
Glucose-Capillary: 89 mg/dL (ref 65–99)
Glucose-Capillary: 136 mg/dL — ABNORMAL HIGH (ref 65–99)
Glucose-Capillary: 233 mg/dL — ABNORMAL HIGH (ref 65–99)

## 2016-11-01 LAB — VITAMIN D 25 HYDROXY (VIT D DEFICIENCY, FRACTURES): Vit D, 25-Hydroxy: 28.8 ng/mL — ABNORMAL LOW (ref 30.0–100.0)

## 2016-11-01 LAB — SEX HORMONE BINDING GLOBULIN: Sex Hormone Binding: 19.3 nmol/L (ref 16.5–55.9)

## 2016-11-01 LAB — BASIC METABOLIC PANEL
Anion gap: 10 (ref 5–15)
BUN: 11 mg/dL (ref 6–20)
CO2: 24 mmol/L (ref 22–32)
Calcium: 8.5 mg/dL — ABNORMAL LOW (ref 8.9–10.3)
Chloride: 99 mmol/L — ABNORMAL LOW (ref 101–111)
Creatinine, Ser: 1.12 mg/dL (ref 0.61–1.24)
GFR calc Af Amer: >60 mL/min (ref >60)
GFR calc non Af Amer: >60 mL/min (ref >60)
Glucose, Bld: 104 mg/dL — ABNORMAL HIGH (ref 65–99)
Potassium: 4 mmol/L (ref 3.5–5.1)
Sodium: 133 mmol/L — ABNORMAL LOW (ref 135–145)

## 2016-11-01 LAB — TESTOSTERONE: Testosterone: 46 ng/dL — ABNORMAL LOW (ref 264–916)

## 2016-11-01 LAB — CALCITRIOL (1,25 DI-OH VIT D): Vit D, 1,25-Dihydroxy: 58.9 pg/mL (ref 19.9–79.3)

## 2016-11-01 MED ORDER — ENOXAPARIN (LOVENOX) PATIENT EDUCATION KIT
PACK | Freq: Once | Status: AC
  Administered 2016-11-01: 12:00:00
  Filled 2016-11-01: qty 1

## 2016-11-01 MED ORDER — ENOXAPARIN (LOVENOX) PATIENT EDUCATION KIT: 1.0000 | PACK | Freq: Once | 0 refills | Status: AC

## 2016-11-01 MED ORDER — DOCUSATE SODIUM 100 MG PO CAPS: 100.0000 mg | ORAL_CAPSULE | Freq: Two times a day (BID) | ORAL | 0 refills | Status: DC

## 2016-11-01 MED ORDER — METHOCARBAMOL 500 MG PO TABS: 500.0000 mg | ORAL_TABLET | Freq: Four times a day (QID) | ORAL | 0 refills | Status: DC | PRN

## 2016-11-01 MED ORDER — ACETAMINOPHEN 500 MG PO TABS: 1000.0000 mg | ORAL_TABLET | Freq: Four times a day (QID) | ORAL | 0 refills | Status: DC

## 2016-11-01 MED ORDER — OXYCODONE HCL 5 MG PO TABS: 5.0000 mg | ORAL_TABLET | Freq: Four times a day (QID) | ORAL | 0 refills | Status: DC | PRN

## 2016-11-01 MED ORDER — ENOXAPARIN SODIUM 40 MG/0.4ML ~~LOC~~ SOLN: 40.0000 mg | SUBCUTANEOUS | 0 refills | Status: DC

## 2016-11-01 NOTE — Discharge Instructions (Signed)
[facility] Discharge Instructions   General Discharge Instructions  WEIGHT BEARING STATUS: Nonweightbearing right leg  RANGE OF MOTION/ACTIVITY: No ankle range of motion at this time. Remain in splint until follow-up visit. Okay to move toes and knee as tolerated  Home exercise program as instructed by therapy in the hospital  Wound Care: Do not remove splint. Keep splint clean and dry.  PAIN MEDICATION USE AND EXPECTATIONS  You have likely been given narcotic medications to help control your pain.  After a traumatic event that results in an fracture (broken bone) with or without surgery, it is ok to use narcotic pain medications to help control one's pain.  We understand that everyone responds to pain differently and each individual patient will be evaluated on a regular basis for the continued need for narcotic medications. Ideally, narcotic medication use should last no more than 6-8 weeks (coinciding with fracture healing).   As a patient it is your responsibility as well to monitor narcotic medication use and report the amount and frequency you use these medications when you come to your office visit.   We would also advise that if you are using narcotic medications, you should take a dose prior to therapy to maximize you participation.  IF YOU ARE ON NARCOTIC MEDICATIONS IT IS NOT PERMISSIBLE TO OPERATE A MOTOR VEHICLE (MOTORCYCLE/CAR/TRUCK/MOPED) OR HEAVY MACHINERY DO NOT MIX NARCOTICS WITH OTHER CNS (CENTRAL NERVOUS SYSTEM) DEPRESSANTS SUCH AS ALCOHOL  Diet: as you were eating previously.  Can use over the counter stool softeners and bowel preparations, such as Miralax, to help with bowel movements.  Narcotics can be constipating.  Be sure to drink plenty of fluids    STOP SMOKING OR USING NICOTINE PRODUCTS!!!!  As discussed nicotine severely impairs your body's ability to heal surgical and traumatic wounds but also impairs bone healing.  Wounds and bone heal by  forming microscopic blood vessels (angiogenesis) and nicotine is a vasoconstrictor (essentially, shrinks blood vessels).  Therefore, if vasoconstriction occurs to these microscopic blood vessels they essentially disappear and are unable to deliver necessary nutrients to the healing tissue.  This is one modifiable factor that you can do to dramatically increase your chances of healing your injury.    (This means no smoking, no nicotine gum, patches, etc)  DO NOT USE NONSTEROIDAL ANTI-INFLAMMATORY DRUGS (NSAID'S)  Using products such as Advil (ibuprofen), Aleve (naproxen), Motrin (ibuprofen) for additional pain control during fracture healing can delay and/or prevent the healing response.  If you would like to take over the counter (OTC) medication, Tylenol (acetaminophen) is ok.  However, some narcotic medications that are given for pain control contain acetaminophen as well. Therefore, you should not exceed more than 4000 mg of tylenol in a day if you do not have liver disease.  Also note that there are may OTC medicines, such as cold medicines and allergy medicines that my contain tylenol as well.  If you have any questions about medications and/or interactions please ask your doctor/PA or your pharmacist.      ICE AND ELEVATE INJURED/OPERATIVE EXTREMITY  Using ice and elevating the injured extremity above your heart can help with swelling and pain control.  Icing in a pulsatile fashion, such as 20 minutes on and 20 minutes off, can be followed.    Do not place ice directly on skin. Make sure there is a barrier between to skin and the ice pack.    Using frozen items such as frozen peas works well as the conform  nicely to the are that needs to be iced.  USE AN ACE WRAP OR TED HOSE FOR SWELLING CONTROL  In addition to icing and elevation, Ace wraps or TED hose are used to help limit and resolve swelling.  It is recommended to use Ace wraps or TED hose until you are informed to stop.    When using Ace  Wraps start the wrapping distally (farthest away from the body) and wrap proximally (closer to the body)   Example: If you had surgery on your leg or thing and you do not have a splint on, start the ace wrap at the toes and work your way up to the thigh        If you had surgery on your upper extremity and do not have a splint on, start the ace wrap at your fingers and work your way up to the upper arm  IF YOU ARE IN A SPLINT OR CAST DO NOT [place]   If your splint gets wet for any reason please contact the office immediately. You may shower in your splint or cast as long as you keep it dry.  This can be done by wrapping in a cast cover or garbage back (or similar)  Do Not stick any thing down your splint or cast such as pencils, money, or hangers to try and scratch yourself with.  If you feel itchy take benadryl as prescribed on the bottle for itching  IF YOU ARE IN A CAM BOOT (BLACK BOOT)  You may remove boot periodically. Perform daily dressing changes as noted below.  Wash the liner of the boot regularly and wear a sock when wearing the boot. It is recommended that you sleep in the boot until told otherwise  CALL THE OFFICE WITH ANY QUESTIONS OR CONCERNS: [phone]

## 2016-11-01 NOTE — Care Management (Signed)
    Durable Medical Equipment        Start     Ordered   11/01/16 1102  For home use only DME standard manual wheelchair with seat cushion  Once    Comments:  Patient suffers from right tibia fracture and multiple sclerosis which impairs their ability to perform daily activities like bathing, dressing, grooming and toileting in the home.  A cane, crutch or walker will not resolve  issue with performing activities of daily living. A wheelchair will allow patient to safely perform daily activities. Patient can safely propel the wheelchair in the home or has a caregiver who can provide assistance.  Accessories: elevating leg rests (ELRs), wheel locks, extensions and anti-tippers.   11/01/16 1102   11/01/16 1102  For home use only DME Walker rolling  Once    Question:  Patient needs a walker to treat with the following condition  Answer:  Tibia fracture   11/01/16 1102   11/01/16 1102  For home use only DME Tub bench  Once     11/01/16 1102   10/31/16 0954  For home use only DME Other see comment  Once    Comments:  knee scooter   10/31/16 0953

## 2016-11-01 NOTE — Care Management (Cosign Needed)
    Durable Medical Equipment        Start     Ordered   11/01/16 1102  For home use only DME standard manual wheelchair with seat cushion  Once    Comments:  Patient suffers from right tibia fracture and multiple sclerosis which impairs their ability to perform daily activities like bathing, dressing, grooming and toileting in the home.  A cane, crutch or walker will not resolve  issue with performing activities of daily living. A wheelchair will allow patient to safely perform daily activities. Patient can safely propel the wheelchair in the home or has a caregiver who can provide assistance.  Accessories: elevating leg rests (ELRs), wheel locks, extensions and anti-tippers.   11/01/16 1102   11/01/16 1102  For home use only DME Walker rolling  Once    Question:  Patient needs a walker to treat with the following condition  Answer:  Tibia fracture   11/01/16 1102   11/01/16 1102  For home use only DME Tub bench  Once     11/01/16 1102   10/31/16 0954  For home use only DME Other see comment  Once    Comments:  knee scooter   10/31/16 0953

## 2016-11-01 NOTE — Progress Notes (Signed)
Occupational Therapy Treatment Patient Details Name: Johnathan Ross MRN: [id number] DOB: 04/28/1971 Today's Date: 11/01/2016    History of present illness Pt is a 46 y.o. male s/p ORIF R pilon fracture, tibia and fibula. PMHx: HTN, Multiple sclerosis, Proliferative diabetic retinopathy, DM 1.    OT comments  Pt making good progress toward OT goals. Able to perform transfers and functional mobility with supervision while maintaining RLE NWB throughout. Educated pt on use of 3 in 1 over toilet and in tub as a seat; also educated on tub transfer technique while maintaining RLE NWB. Discussed wrapping RLE in plastic for bathing to prevent moisture. D/c plan remains appropriate. Will continue to follow acutely.   Follow Up Recommendations  No OT follow up;Supervision - Intermittent    Equipment Recommendations  3 in 1 bedside commode    Recommendations for Other Services      Precautions / Restrictions Precautions Precautions: Fall Restrictions Weight Bearing Restrictions: Yes RLE Weight Bearing: Non weight bearing       Mobility Bed Mobility               General bed mobility comments: Pt sitting EOB upon arrival  Transfers Overall transfer level: Needs assistance Equipment used: Rolling walker (2 wheeled) Transfers: Sit to/from Stand Sit to Stand: Supervision         General transfer comment: Supervision for safety, no physical assist required. Good hand placement and technique    Balance Overall balance assessment: Needs assistance Sitting-balance support: Feet supported;No upper extremity supported Sitting balance-Leahy Scale: Good     Standing balance support: Bilateral upper extremity supported Standing balance-Leahy Scale: Poor                             ADL either performed or assessed with clinical judgement   ADL Overall ADL's : Needs assistance/impaired               Lower Body Bathing Details (indicate cue type and reason):  Educated on wrapping RLE in plastic and tapping off at top to prevent moisture during bathing Upper Body Dressing : Set up;Sitting     Lower Body Dressing Details (indicate cue type and reason): pt able to adjust R sock sitting EOB Toilet Transfer: Supervision/safety;Ambulation;RW Toilet Transfer Details (indicate cue type and reason): Educated on use of 3 in 1 over toilet       Tub/Shower Transfer Details (indicate cue type and reason): Educated on use of 3 in 1 in tub as a seat and technique for tub transfer while maintaining NWB on RLE; pt verbalized understanding Functional mobility during ADLs: Supervision/safety;Rolling walker       Vision       Perception     Praxis      Cognition Arousal/Alertness: Awake/alert Behavior During Therapy: WFL for tasks assessed/performed Overall Cognitive Status: Within Functional Limits for tasks assessed                                          Exercises     Shoulder Instructions       General Comments      Pertinent Vitals/ Pain       Pain Assessment: Faces Faces Pain Scale: Hurts little more Pain Location: RLE Pain Descriptors / Indicators: Sore Pain Intervention(s): Monitored during session  Home Living  Prior Functioning/Environment              Frequency  Min 2X/week        Progress Toward Goals  OT Goals(current goals can now be found in the care plan section)  Progress towards OT goals: Progressing toward goals  Acute Rehab OT Goals Patient Stated Goal: be as independent as possible OT Goal Formulation: With patient  Plan Discharge plan remains appropriate    Co-evaluation                 AM-PAC PT "6 Clicks" Daily Activity     Outcome Measure   Help from another person eating meals?: None Help from another person taking care of personal grooming?: None Help from another person toileting, which includes using  toliet, bedpan, or urinal?: A Little Help from another person bathing (including washing, rinsing, drying)?: A Lot Help from another person to put on and taking off regular upper body clothing?: A Little Help from another person to put on and taking off regular lower body clothing?: A Lot 6 Click Score: 18    End of Session Equipment Utilized During Treatment: Rolling walker  OT Visit Diagnosis: Other abnormalities of gait and mobility (R26.89);Pain;History of falling (Z91.81) Pain - Right/Left: Right Pain - part of body: Leg   Activity Tolerance Patient tolerated treatment well   Patient Left with call bell/phone within reach;Other (comment) (sitting EOB)   Nurse Communication          Time: 8366-2947 OT Time Calculation (min): 10 min  Charges: OT General Charges $OT Visit: 1 Procedure OT Treatments $Self Care/Home Management : 8-22 mins  Temperance Kelemen A. Ulice Brilliant, M.S., OTR/L Pager: [place] 11/01/2016, 11:16 AM

## 2016-11-01 NOTE — Discharge Summary (Signed)
Orthopaedic Trauma Service (OTS)  Patient ID: Johnathan Ross MRN: [id number] DOB/AGE: 1970/06/03 46 y.o.  Admit date: 10/29/2016 Discharge date: 11/01/2016   Admission Diagnoses: Closed fracture R tibial plafond  R fibula fracture  Type 1 DM Hypothyroidism HTN Hyperlipidemia   Discharge Diagnoses:  Principal Problem:   Closed fracture of right tibial plafond with fibula involvement Active Problems:   Closed tibia fracture   Type 1 diabetes mellitus (HCC)   Hypothyroidism   Hypertension   Hyperlipidemia   Hypogonadism in male   Vitamin D insufficiency   Procedures Performed:  10/30/2016- Dr. Marcelino Scot  1. ORIF of right pilon fracture, tibia and fibula. 2. Stress evaluation of the syndesmosis under fluoro   Discharged Condition: good  Hospital Course:   Johnathan Ross is an 46 y.o. black male history of insulin dependent diabetes (insulin pump) and MS who sustained a ground-level fall on 10/29/2016. Patient was out in his driveway which is made of gravel, he was already tired and sustained a fall. Patient had immediate onset of pain in his right ankle and inability to bear weight. He was brought to the [facility] for evaluation. He was found to have a complex right distal tibia and fibula fracture. Orthopedics on call was consult. Patient was then transferred to [facility] for admission and evaluation. Patient was seen and evaluated by Dr. [name] in the emergency department. He was admitted orthopedics for pain control and for evaluation by the orthopedic trauma service. Due to the complexity of injury Dr. [name] asserted that this injury was at a scope of his practice and it was necessary for orthopedic trauma fellowship trained surgeon to manage. Pt was taken to the OR on 10/30/2016 for the procedure noted above. Pt tolerated the procedure well. After surgery he was transferred back to the Ortho floor for observation, pain control and therapy. Pt was  started on insulin drip post op until he was more alert to use his insulin pump. Later in the evening on POD 0 pt was alert enough to resume his insulin pump. Pt was followed by the DM coordinator as well.  Pt did not have any periop issues. He worked with PT and OT after surgery and progressed well. He was covered with ancef for surgical prophylaxis. Pt was also covered with Lovenox for DVT and PE prophylaxis. On POD 2 pt was deemed stable to discharge home  Pt found to be vitamin d insufficient along with hypogonadism. Started on vitamin d, will need follow up with endocrine as outpatient   Consults: DM coordinator  Significant Diagnostic Studies: labs:  Results for Johnathan, Ross (MRN [id number]) as of 11/29/2016 09:04  Ref. Range 10/31/2016 04:15  Vit D, 1,25-Dihydroxy Latest Ref Range: 19.9 - 79.3 pg/mL 58.9  Vitamin D, 25-Hydroxy Latest Ref Range: 30.0 - 100.0 ng/mL 28.8 (L)   Results for Johnathan, Ross (MRN [id number]) as of 11/29/2016 09:04  Ref. Range 11/03/2016 07:00  COMPREHENSIVE METABOLIC PANEL Unknown Rpt (A)  Sodium Latest Ref Range: 135 - 145 mmol/L 133 (L)  Potassium Latest Ref Range: 3.5 - 5.1 mmol/L 4.0  Chloride Latest Ref Range: 101 - 111 mmol/L 97 (L)  CO2 Latest Ref Range: 22 - 32 mmol/L 24  Glucose Latest Ref Range: 65 - 99 mg/dL 209 (H)  BUN Latest Ref Range: 6 - 20 mg/dL 15  Creatinine Latest Ref Range: 0.61 - 1.24 mg/dL 1.35 (H)  Calcium Latest Ref Range: 8.9 - 10.3 mg/dL 9.1  Anion  gap Latest Ref Range: 5 - 15  12  Alkaline Phosphatase Latest Ref Range: 38 - 126 U/L 85  Albumin Latest Ref Range: 3.5 - 5.0 g/dL 3.6  Lipase Latest Ref Range: 11 - 51 U/L 14  AST Latest Ref Range: 15 - 41 U/L 39  ALT Latest Ref Range: 17 - 63 U/L 25  Total Protein Latest Ref Range: 6.5 - 8.1 g/dL 6.7  Total Bilirubin Latest Ref Range: 0.3 - 1.2 mg/dL 0.8  GFR, Est African American Latest Ref Range: >60 mL/min >60  GFR, Est Non African American Latest Ref Range: >60 mL/min >60  WBC  Latest Ref Range: 4.0 - 10.5 K/uL 14.4 (H)  RBC Latest Ref Range: 4.22 - 5.81 MIL/uL 4.26  Hemoglobin Latest Ref Range: 13.0 - 17.0 g/dL 12.2 (L)  HCT Latest Ref Range: 39.0 - 52.0 % 37.0 (L)  MCV Latest Ref Range: 78.0 - 100.0 fL 86.9  MCH Latest Ref Range: 26.0 - 34.0 pg 28.6  MCHC Latest Ref Range: 30.0 - 36.0 g/dL 33.0  RDW Latest Ref Range: 11.5 - 15.5 % 13.2  Platelets Latest Ref Range: 150 - 400 K/uL 329  Neutrophils Latest Units: % 84  Lymphocytes Latest Units: % 9  Monocytes Relative Latest Units: % 5  Eosinophil Latest Units: % 2  Basophil Latest Units: % 0  NEUT# Latest Ref Range: 1.7 - 7.7 K/uL 12.1 (H)  Lymphocyte # Latest Ref Range: 0.7 - 4.0 K/uL 1.3  Monocyte # Latest Ref Range: 0.1 - 1.0 K/uL 0.7  Eosinophils Absolute Latest Ref Range: 0.0 - 0.7 K/uL 0.3  Basophils Absolute Latest Ref Range: 0.0 - 0.1 K/uL 0.0  Smear Review Unknown MORPHOLOGY UNREMA...  Results for Johnathan, Ross (MRN [id number]) as of 11/29/2016 09:04  Ref. Range 10/31/2016 04:15  PTH, Intact Latest Ref Range: 15 - 65 pg/mL 44  PTH Interp Unknown Comment  TSH Latest Ref Range: 0.350 - 4.500 uIU/mL 3.653   Results for Johnathan, Ross (MRN [id number]) as of 11/29/2016 09:04  Ref. Range 10/31/2016 04:15  Testosterone Latest Ref Range: 264 - 916 ng/dL 46 (L)    Treatments: IV hydration, antibiotics: Ancef, analgesia: Dilaudid and oxycodone, anticoagulation: LMW heparin, therapies: PT, OT and RN and surgery: as above   Discharge Exam:   Orthopedic Trauma Service Progress Note      Subjective:   Doing very well this am No new issues Sugar control much improved Pt changed site of insulin pump yesterday    Ready to go home today  Voiding w/o difficulty + flatus      Review of Systems  Constitutional: Negative for chills and fever.  Respiratory: Negative for shortness of breath.   Cardiovascular: Negative for chest pain.  Gastrointestinal: Negative for abdominal pain, nausea and vomiting.       Objective:    VITALS:         Vitals:    10/31/16 1036 10/31/16 1400 10/31/16 2152 11/01/16 0535  BP: 139/83 131/73 132/73 (!) 143/93  Pulse: 92 94 (!) 107 97  Resp: '20 18 18 18  ' Temp: 98.6 F (37 C) 98.8 F (37.1 C) 98.3 F (36.8 C) 98.3 F (36.8 C)  TempSrc: Oral Oral Oral Oral  SpO2: 100% 98% 99% 96%  Weight:          Height:              Intake/Output      06/06 0701 - 06/07 0700 06/07 0701 - 06/08 0700  P.O. 960 360   I.V. (mL/kg) 649 (6.7)    IV Piggyback     Total Intake(mL/kg) 1609 (16.5) 360 (3.7)   Urine (mL/kg/hr) 1575 (0.7) 200 (0.6)   Blood     Total Output 1575 200   Net +34 +160        Urine Occurrence 2 x       LABS   Lab Results Last 24 Hours       Results for orders placed or performed during the hospital encounter of 10/29/16 (from the past 24 hour(s))  Osmolality     Status: None    Collection Time: 10/31/16 11:06 AM  Result Value Ref Range    Osmolality 294 275 - 295 mOsm/kg  Glucose, capillary     Status: Abnormal    Collection Time: 10/31/16 12:21 PM  Result Value Ref Range    Glucose-Capillary 276 (H) 65 - 99 mg/dL    Comment 1 Document in Chart    Glucose, capillary     Status: Abnormal    Collection Time: 10/31/16  4:50 PM  Result Value Ref Range    Glucose-Capillary 260 (H) 65 - 99 mg/dL  Glucose, capillary     Status: Abnormal    Collection Time: 10/31/16  9:49 PM  Result Value Ref Range    Glucose-Capillary 166 (H) 65 - 99 mg/dL  Glucose, capillary     Status: None    Collection Time: 11/01/16  3:04 AM  Result Value Ref Range    Glucose-Capillary 89 65 - 99 mg/dL  CBC     Status: Abnormal    Collection Time: 11/01/16  4:01 AM  Result Value Ref Range    WBC 12.2 (H) 4.0 - 10.5 K/uL    RBC 4.00 (L) 4.22 - 5.81 MIL/uL    Hemoglobin 11.5 (L) 13.0 - 17.0 g/dL    HCT 34.6 (L) 39.0 - 52.0 %    MCV 86.5 78.0 - 100.0 fL    MCH 28.8 26.0 - 34.0 pg    MCHC 33.2 30.0 - 36.0 g/dL    RDW 12.9 11.5 - 15.5 %    Platelets 220  150 - 400 K/uL  Basic metabolic panel     Status: Abnormal    Collection Time: 11/01/16  4:01 AM  Result Value Ref Range    Sodium 133 (L) 135 - 145 mmol/L    Potassium 4.0 3.5 - 5.1 mmol/L    Chloride 99 (L) 101 - 111 mmol/L    CO2 24 22 - 32 mmol/L    Glucose, Bld 104 (H) 65 - 99 mg/dL    BUN 11 6 - 20 mg/dL    Creatinine, Ser 1.12 0.61 - 1.24 mg/dL    Calcium 8.5 (L) 8.9 - 10.3 mg/dL    GFR calc non Af Amer >60 >60 mL/min    GFR calc Af Amer >60 >60 mL/min    Anion gap 10 5 - 15  Glucose, capillary     Status: Abnormal    Collection Time: 11/01/16  7:39 AM  Result Value Ref Range    Glucose-Capillary 136 (H) 65 - 99 mg/dL    Comment 1 Notify RN        CBG (last 3)   Recent Labs (last 2 labs)     Recent Labs   10/31/16 2149 11/01/16 0304 11/01/16 0739  GLUCAP 166* 89 136*        Results for MOISHE, SCHELLENBERG (MRN [id number]) as of 11/01/2016 10:22  Ref. Range 10/31/2016 04:15  Vitamin D, 25-Hydroxy Latest Ref Range: 30.0 - 100.0 ng/mL 28.8 (L)    Results for SPIROS, GREENFELD (MRN [id number]) as of 11/01/2016 10:22   Ref. Range 10/31/2016 04:15  Sex Horm Binding Glob, Serum Latest Ref Range: 16.5 - 55.9 nmol/L 19.3  Testosterone Latest Ref Range: 264 - 916 ng/dL 46 (L)      Results for EVON, LOPEZPEREZ (MRN [id number]) as of 11/01/2016 10:22   Ref. Range 10/31/2016 04:15  TSH Latest Ref Range: 0.350 - 4.500 uIU/mL 3.653    Results for JACCOB, CZAPLICKI (MRN [id number]) as of 11/01/2016 10:22   Ref. Range 10/31/2016 04:15  Phosphorus Latest Ref Range: 2.5 - 4.6 mg/dL 3.6  Magnesium Latest Ref Range: 1.7 - 2.4 mg/dL 1.6 (L)  Alkaline Phosphatase Latest Ref Range: 38 - 126 U/L 83  Albumin Latest Ref Range: 3.5 - 5.0 g/dL 3.8  AST Latest Ref Range: 15 - 41 U/L 27  ALT Latest Ref Range: 17 - 63 U/L 20  Total Protein Latest Ref Range: 6.5 - 8.1 g/dL 6.3 (L)  Total Bilirubin Latest Ref Range: 0.3 - 1.2 mg/dL 0.7  PREALBUMIN Latest Ref Range: 18 - 38 mg/dL 15.9 (L)    PHYSICAL  EXAM:      Gen: awake and alert, NAD, sitting on EOB Lungs: unlabored Cardiac: RRR Ext:       Right Lower Extremity              Dressing and splint c/d/i                      Ext warm             + DP pulse             EHL, FHL, lesser toe motor grossly intact             DPN, SPN, TN sensation intact                       Swelling stable              No pain with passive stretch of toes      Assessment/Plan: 2 Days Post-Op    Principal Problem:   Closed fracture of right tibial plafond with fibula involvement Active Problems:   Closed tibia fracture               Anti-infectives     Start     Dose/Rate Route Frequency Ordered Stop    10/30/16 1500   ceFAZolin (ANCEF) IVPB 1 g/50 mL premix     1 g 100 mL/hr over 30 Minutes Intravenous Every 6 hours 10/30/16 1350 10/31/16 0355    10/30/16 0830   ceFAZolin (ANCEF) IVPB 2g/100 mL premix     2 g 200 mL/hr over 30 Minutes Intravenous  Once 10/30/16 0824 10/30/16 0937    10/30/16 0827   ceFAZolin (ANCEF) 2-4 GM/100ML-% IVPB    Comments:  Ernesta Amble   : cabinet override         10/30/16 0827 10/30/16 0907     .   POD/HD#: 70   46 year old male insulin-dependent diabetes, MS, HTN, s/p ground level fall with comminuted right distal tibia and fibula fracture   - Right tibial plafond fracture, primarily extra-articular, R medial malleolus fracture, R distal fibula fracture and R proximal fibula fracture, stable syndesmosis s/p ORIF  NWB R LEx x 8 weeks             Splint x 2 weeks then begin ankle ROM             Unrestricted Knee ROM              PT/OT please teach HEP for R LEx             Ice and elevate             Up with assistance              Will try to obtain knee scooter for home              Will order CAM boot that pt will need to bring to first follow up appointment      - Pain management:             tylenol 108m po q6h             Oxycodone 5-15 mg po q6h prn severe pain               Robaxin 500 mg 1-2 po q6h prn spasms   - ABL anemia/Hemodynamics             Stable   - Medical issues              Insulin-dependent diabetes                         insulin pump                                     Sugar control much improved   - DVT/PE prophylaxis:             Lovenox x 21 days   - ID:              Perioperative Ancef completed    - Metabolic Bone Disease:             vitamin d insufficiency              Hypogonadism                Patient with vitamin D insufficiency as well as hypogonadism setting of insulin-dependent diabetes mellitus. Will initiate vitamin D therapy with 5000 IUs of vitamin D3 daily. Hopefully this will make some improvement in his testosterone however likely needs further endocrinologic workup to evaluate his testosterone deficiency. Still awaiting remaining testosterone panel labs, including free testosterone. I would recommend repeating his testosterone panel in 4-6 weeks. If testosterone remains depressed will likely then require additional labs including LH and FSH To identify primary or secondary.             Would absolutely recommend bone density scan as an outpatient             May also need to strongly consider pharmacologic agent for bone health. In this particular setting think I would favor Forteo over the other classes of medications due to its true anabolic effects               Patient already sees Dr. KBobbie Stackendocrinology. Will copy him on this note as well as a discharge summary.   - Activity:  Nonweightbearing right leg   - FEN/GI prophylaxis/Foley/Lines:             CHO mod diet             sodium improved    - Dispo:             Dc home today              Follow up with ortho in 2 weeks      Disposition: 01-Home or Self Care  Discharge Instructions    Call MD / Call 911    Complete by:  As directed    If you experience chest pain or shortness of breath, CALL 911 and be transported to the  hospital emergency room.  If you develope a fever above 101 F, pus (white drainage) or increased drainage or redness at the wound, or calf pain, call your surgeon's office.   Constipation Prevention    Complete by:  As directed    Drink plenty of fluids.  Prune juice may be helpful.  You may use a stool softener, such as Colace (over the counter) 100 mg twice a day.  Use MiraLax (over the counter) for constipation as needed.   Diet Carb Modified    Complete by:  As directed    Discharge instructions    Complete by:  As directed    Orthopaedic Trauma Service Discharge Instructions   General Discharge Instructions  WEIGHT BEARING STATUS: Nonweightbearing right leg  RANGE OF MOTION/ACTIVITY: No ankle range of motion at this time. Remain in splint until follow-up visit. Okay to move toes and knee as tolerated  Home exercise program as instructed by therapy in the hospital  Wound Care: Do not remove splint. Keep splint clean and dry.  PAIN MEDICATION USE AND EXPECTATIONS  You have likely been given narcotic medications to help control your pain.  After a traumatic event that results in an fracture (broken bone) with or without surgery, it is ok to use narcotic pain medications to help control one's pain.  We understand that everyone responds to pain differently and each individual patient will be evaluated on a regular basis for the continued need for narcotic medications. Ideally, narcotic medication use should last no more than 6-8 weeks (coinciding with fracture healing).   As a patient it is your responsibility as well to monitor narcotic medication use and report the amount and frequency you use these medications when you come to your office visit.   We would also advise that if you are using narcotic medications, you should take a dose prior to therapy to maximize you participation.  IF YOU ARE ON NARCOTIC MEDICATIONS IT IS NOT PERMISSIBLE TO OPERATE A MOTOR VEHICLE  (MOTORCYCLE/CAR/TRUCK/MOPED) OR HEAVY MACHINERY DO NOT MIX NARCOTICS WITH OTHER CNS (CENTRAL NERVOUS SYSTEM) DEPRESSANTS SUCH AS ALCOHOL  Diet: as you were eating previously.  Can use over the counter stool softeners and bowel preparations, such as Miralax, to help with bowel movements.  Narcotics can be constipating.  Be sure to drink plenty of fluids    STOP SMOKING OR USING NICOTINE PRODUCTS!!!!  As discussed nicotine severely impairs your body's ability to heal surgical and traumatic wounds but also impairs bone healing.  Wounds and bone heal by forming microscopic blood vessels (angiogenesis) and nicotine is a vasoconstrictor (essentially, shrinks blood vessels).  Therefore, if vasoconstriction occurs to these microscopic blood vessels they essentially disappear and are unable to deliver necessary nutrients to the healing tissue.  This is one modifiable factor that you can do to dramatically increase your chances of healing your injury.    (This means no smoking, no nicotine gum, patches, etc)  DO NOT USE NONSTEROIDAL ANTI-INFLAMMATORY DRUGS (NSAID'S)  Using products such as Advil (ibuprofen), Aleve (naproxen), Motrin (ibuprofen) for additional pain control during fracture healing can delay and/or prevent the healing response.  If you would like to take over the counter (OTC) medication, Tylenol (acetaminophen) is ok.  However, some narcotic medications that are given for pain control contain acetaminophen as well. Therefore, you should not exceed more than 4000 mg of tylenol in a day if you do not have liver disease.  Also note that there are may OTC medicines, such as cold medicines and allergy medicines that my contain tylenol as well.  If you have any questions about medications and/or interactions please ask your doctor/PA or your pharmacist.      ICE AND ELEVATE INJURED/OPERATIVE EXTREMITY  Using ice and elevating the injured extremity above your heart can help with swelling and pain  control.  Icing in a pulsatile fashion, such as 20 minutes on and 20 minutes off, can be followed.    Do not place ice directly on skin. Make sure there is a barrier between to skin and the ice pack.    Using frozen items such as frozen peas works well as the conform nicely to the are that needs to be iced.  USE AN ACE WRAP OR TED HOSE FOR SWELLING CONTROL  In addition to icing and elevation, Ace wraps or TED hose are used to help limit and resolve swelling.  It is recommended to use Ace wraps or TED hose until you are informed to stop.    When using Ace Wraps start the wrapping distally (farthest away from the body) and wrap proximally (closer to the body)   Example: If you had surgery on your leg or thing and you do not have a splint on, start the ace wrap at the toes and work your way up to the thigh        If you had surgery on your upper extremity and do not have a splint on, start the ace wrap at your fingers and work your way up to the upper arm  IF YOU ARE IN A SPLINT OR CAST DO NOT [place]   If your splint gets wet for any reason please contact the office immediately. You may shower in your splint or cast as long as you keep it dry.  This can be done by wrapping in a cast cover or garbage back (or similar)  Do Not stick any thing down your splint or cast such as pencils, money, or hangers to try and scratch yourself with.  If you feel itchy take benadryl as prescribed on the bottle for itching  IF YOU ARE IN A CAM BOOT (BLACK BOOT)  You may remove boot periodically. Perform daily dressing changes as noted below.  Wash the liner of the boot regularly and wear a sock when wearing the boot. It is recommended that you sleep in the boot until told otherwise  CALL THE OFFICE WITH ANY QUESTIONS OR CONCERNS: [phone]   Driving restrictions    Complete by:  As directed    No driving   Increase activity slowly as tolerated    Complete by:  As directed    Non weight bearing     Complete by:  As directed  Laterality:  right   Extremity:  Lower     Allergies as of 11/01/2016   No Known Allergies     Medication List    STOP taking these medications   magic mouthwash Soln   NOVOLOG 100 UNIT/ML injection Generic drug:  insulin aspart     TAKE these medications   acetaminophen 500 MG tablet Commonly known as:  TYLENOL Take 2 tablets (1,000 mg total) by mouth every 6 (six) hours.   AMPYRA 10 MG Tb12 Generic drug:  dalfampridine Take 10 mg by mouth 2 (two) times daily. Take one tablet (10 mg) by mouth every morning and mid afternoon   docusate sodium 100 MG capsule Commonly known as:  COLACE Take 1 capsule (100 mg total) by mouth 2 (two) times daily.   enoxaparin 40 MG/0.4ML injection Commonly known as:  LOVENOX Inject 0.4 mLs (40 mg total) into the skin daily.   insulin pump Soln Inject into the skin continuous. Novolog   levothyroxine 125 MCG tablet Commonly known as:  SYNTHROID Take 1 tablet (125 mcg total) by mouth daily before breakfast. What changed:  when to take this   methocarbamol 500 MG tablet Commonly known as:  ROBAXIN Take 1-2 tablets (500-1,000 mg total) by mouth every 6 (six) hours as needed for muscle spasms.   oxyCODONE 5 MG immediate release tablet Commonly known as:  Oxy IR/ROXICODONE Take 1-3 tablets (5-15 mg total) by mouth every 6 (six) hours as needed for moderate pain, severe pain or breakthrough pain (5 mg for mild pain, 10 mg for moderate pain, 15 mg for severe pain).   simvastatin 20 MG tablet Commonly known as:  ZOCOR TAKE 1 TABLET BY MOUTH AT BEDTIME What changed:  See the new instructions.     ASK your doctor about these medications   enoxaparin Kit Commonly known as:  LOVENOX 1 kit by Does not apply route once. Ask about: Should I take this medication?      Follow-up Information    Altamese Sutter, MD. Schedule an appointment as soon as possible for a visit in 10 day(s).   Specialty:  Orthopedic  Surgery Contact information: [place] [address] [phone]        Elayne Snare, MD. Schedule an appointment as soon as possible for a visit in 3 week(s).   Specialty:  Endocrinology Contact information: [address] [place] [address] [phone]           Discharge Instructions and Plan:   46 year old male insulin-dependent diabetes, MS, HTN, s/p ground level fall with comminuted right distal tibia and fibula fracture   - Right tibial plafond fracture, primarily extra-articular, R medial malleolus fracture, R distal fibula fracture and R proximal fibula fracture, stable syndesmosis s/p ORIF               NWB R LEx x 8 weeks             Splint x 2 weeks then begin ankle ROM             Unrestricted Knee ROM              Ice and elevate             Up with assistance               knee scooter for home              Will order CAM boot  that pt will need to bring to first follow up appointment      - Pain management:             tylenol 1067m po q6h             Oxycodone 5-15 mg po q6h prn severe pain              Robaxin 500 mg 1-2 po q6h prn spasms   - ABL anemia/Hemodynamics             Stable   - Medical issues              Insulin-dependent diabetes                         insulin pump                                     Sugar control much improved   - DVT/PE prophylaxis:             Lovenox x 21 days   - ID:              Perioperative Ancef completed    - Metabolic Bone Disease:             vitamin d insufficiency              Hypogonadism                Patient with vitamin D insufficiency as well as hypogonadism in setting of insulin-dependent diabetes mellitus. Will initiate vitamin D therapy with 5000 IUs of vitamin D3 daily. Hopefully this will make some improvement in his testosterone however likely needs further endocrinologic workup to evaluate his testosterone deficiency. Still awaiting  remaining testosterone panel labs, including free testosterone. I would recommend repeating his testosterone panel in 4-6 weeks. If testosterone remains depressed will likely then require additional labs including LH and FSH To identify primary or secondary.             Would absolutely recommend bone density scan as an outpatient             May also need to strongly consider pharmacologic agent for bone health. In this particular setting think I would favor Forteo over the other classes of medications due to its true anabolic effects               Patient already sees Dr. KBobbie Stackendocrinology. Will copy him on this note as well as a discharge summary.   - Activity:             Nonweightbearing right leg   - FEN/GI prophylaxis/Foley/Lines:             CHO mod diet             sodium improved    - Dispo:             Dc home today              Follow up with ortho in 2 weeks   Signed:  KJari Pigg PA-C [facility] [phone](P) 11/29/2016, 9:06 AM

## 2016-11-01 NOTE — Care Management Note (Signed)
Case Management Note  Patient Details  Name: Johnathan Ross MRN: [id number] Date of Birth: [birth year]  Subjective/Objective:                    Action/Plan:  Patient has prescription for knee scooter and address for [facility] . His parents will pick up knee scooter.  He plans to discharge to sisters and also has daughter who will help.  Rolling walker and wheelchair and tub bench ordered through [facility] and will be delivered to patient's hospital room before discharge today.   PT did not recommend HHPT. Discussed with Ainsley Spinner PA . Per [name] no need for home health PT.  Patient aware and voiced understanding to all of above. Expected Discharge Date:  11/01/16               Expected Discharge Plan:  Home/Self Care  In-House Referral:     Discharge planning Services  CM Consult  Post Acute Care Choice:  Durable Medical Equipment Choice offered to:  Patient, Sibling  DME Arranged:  Walker rolling, Tub bench, Wheelchair manual DME Agency:  [place]:    [place]:     Status of Service:  Completed, signed off  If discussed at [employer] of Stay Meetings, dates discussed:    Additional Comments:  Marilu Favre, RN 11/01/2016, 11:32 AM

## 2016-11-01 NOTE — Progress Notes (Signed)
Orthopedic Tech Progress Note Patient Details:  Johnathan Ross 03-Dec-1970 [id number]  Ortho Devices Type of Ortho Device: CAM walker   [name] 11/01/2016, 11:27 AM

## 2016-11-01 NOTE — Progress Notes (Signed)
Orthopedic Trauma Service Progress Note    Subjective:  Doing very well this am No new issues Sugar control much improved Pt changed site of insulin pump yesterday   Ready to go home today  Voiding w/o difficulty + flatus    Review of Systems  Constitutional: Negative for chills and fever.  Respiratory: Negative for shortness of breath.   Cardiovascular: Negative for chest pain.  Gastrointestinal: Negative for abdominal pain, nausea and vomiting.    Objective:   VITALS:   Vitals:   10/31/16 1036 10/31/16 1400 10/31/16 2152 11/01/16 0535  BP: 139/83 131/73 132/73 (!) 143/93  Pulse: 92 94 (!) 107 97  Resp: 20 18 18 18   Temp: 98.6 F (37 C) 98.8 F (37.1 C) 98.3 F (36.8 C) 98.3 F (36.8 C)  TempSrc: Oral Oral Oral Oral  SpO2: 100% 98% 99% 96%  Weight:      Height:        Intake/Output      06/06 0701 - 06/07 0700 06/07 0701 - 06/08 0700   P.O. 960 360   I.V. (mL/kg) 649 (6.7)    IV Piggyback     Total Intake(mL/kg) 1609 (16.5) 360 (3.7)   Urine (mL/kg/hr) 1575 (0.7) 200 (0.6)   Blood     Total Output 1575 200   Net +34 +160        Urine Occurrence 2 x      LABS  Results for orders placed or performed during the hospital encounter of 10/29/16 (from the past 24 hour(s))  Osmolality     Status: None   Collection Time: 10/31/16 11:06 AM  Result Value Ref Range   Osmolality 294 275 - 295 mOsm/kg  Glucose, capillary     Status: Abnormal   Collection Time: 10/31/16 12:21 PM  Result Value Ref Range   Glucose-Capillary 276 (H) 65 - 99 mg/dL   Comment 1 Document in Chart   Glucose, capillary     Status: Abnormal   Collection Time: 10/31/16  4:50 PM  Result Value Ref Range   Glucose-Capillary 260 (H) 65 - 99 mg/dL  Glucose, capillary     Status: Abnormal   Collection Time: 10/31/16  9:49 PM  Result Value Ref Range   Glucose-Capillary 166 (H) 65 - 99 mg/dL  Glucose, capillary     Status: None   Collection Time: 11/01/16  3:04 AM   Result Value Ref Range   Glucose-Capillary 89 65 - 99 mg/dL  CBC     Status: Abnormal   Collection Time: 11/01/16  4:01 AM  Result Value Ref Range   WBC 12.2 (H) 4.0 - 10.5 K/uL   RBC 4.00 (L) 4.22 - 5.81 MIL/uL   Hemoglobin 11.5 (L) 13.0 - 17.0 g/dL   HCT 34.6 (L) 39.0 - 52.0 %   MCV 86.5 78.0 - 100.0 fL   MCH 28.8 26.0 - 34.0 pg   MCHC 33.2 30.0 - 36.0 g/dL   RDW 12.9 11.5 - 15.5 %   Platelets 220 150 - 400 K/uL  Basic metabolic panel     Status: Abnormal   Collection Time: 11/01/16  4:01 AM  Result Value Ref Range   Sodium 133 (L) 135 - 145 mmol/L   Potassium 4.0 3.5 - 5.1 mmol/L   Chloride 99 (L) 101 - 111 mmol/L   CO2 24 22 - 32 mmol/L   Glucose, Bld 104 (H) 65 - 99 mg/dL   BUN 11 6 - 20 mg/dL   Creatinine, Ser 1.12  0.61 - 1.24 mg/dL   Calcium 8.5 (L) 8.9 - 10.3 mg/dL   GFR calc non Af Amer >60 >60 mL/min   GFR calc Af Amer >60 >60 mL/min   Anion gap 10 5 - 15  Glucose, capillary     Status: Abnormal   Collection Time: 11/01/16  7:39 AM  Result Value Ref Range   Glucose-Capillary 136 (H) 65 - 99 mg/dL   Comment 1 Notify RN    CBG (last 3)   Recent Labs  10/31/16 2149 11/01/16 0304 11/01/16 0739  GLUCAP 166* 89 136*    Results for DUILIO, HERITAGE (MRN [id number]) as of 11/01/2016 10:22  Ref. Range 10/31/2016 04:15  Vitamin D, 25-Hydroxy Latest Ref Range: 30.0 - 100.0 ng/mL 28.8 (L)   Results for LENN, VOLKER (MRN [id number]) as of 11/01/2016 10:22  Ref. Range 10/31/2016 04:15  Sex Horm Binding Glob, Serum Latest Ref Range: 16.5 - 55.9 nmol/L 19.3  Testosterone Latest Ref Range: 264 - 916 ng/dL 46 (L)    Results for ZORION, NIMS (MRN [id number]) as of 11/01/2016 10:22  Ref. Range 10/31/2016 04:15  TSH Latest Ref Range: 0.350 - 4.500 uIU/mL 3.653   Results for OFFIE, WAIDE (MRN [id number]) as of 11/01/2016 10:22  Ref. Range 10/31/2016 04:15  Phosphorus Latest Ref Range: 2.5 - 4.6 mg/dL 3.6  Magnesium Latest Ref Range: 1.7 - 2.4 mg/dL 1.6 (L)  Alkaline  Phosphatase Latest Ref Range: 38 - 126 U/L 83  Albumin Latest Ref Range: 3.5 - 5.0 g/dL 3.8  AST Latest Ref Range: 15 - 41 U/L 27  ALT Latest Ref Range: 17 - 63 U/L 20  Total Protein Latest Ref Range: 6.5 - 8.1 g/dL 6.3 (L)  Total Bilirubin Latest Ref Range: 0.3 - 1.2 mg/dL 0.7  PREALBUMIN Latest Ref Range: 18 - 38 mg/dL 15.9 (L)   PHYSICAL EXAM:    Gen: awake and alert, NAD, sitting on EOB Lungs: unlabored Cardiac: RRR Ext:       Right Lower Extremity   Dressing and splint c/d/i                      Ext warm             + DP pulse             EHL, FHL, lesser toe motor grossly intact             DPN, SPN, TN sensation intact                       Swelling stable              No pain with passive stretch of toes    Assessment/Plan: 2 Days Post-Op   Principal Problem:   Closed fracture of right tibial plafond with fibula involvement Active Problems:   Closed tibia fracture   Anti-infectives    Start     Dose/Rate Route Frequency Ordered Stop   10/30/16 1500  ceFAZolin (ANCEF) IVPB 1 g/50 mL premix     1 g 100 mL/hr over 30 Minutes Intravenous Every 6 hours 10/30/16 1350 10/31/16 0355   10/30/16 0830  ceFAZolin (ANCEF) IVPB 2g/100 mL premix     2 g 200 mL/hr over 30 Minutes Intravenous  Once 10/30/16 0824 10/30/16 0937   10/30/16 0827  ceFAZolin (ANCEF) 2-4 GM/100ML-% IVPB    Comments:  Ernesta Amble   : cabinet override  10/30/16 0827 10/30/16 1610    .  POD/HD#: 58  46 year old male insulin-dependent diabetes, MS, HTN, s/p ground level fall with comminuted right distal tibia and fibula fracture   - Right tibial plafond fracture, primarily extra-articular, R medial malleolus fracture, R distal fibula fracture and R proximal fibula fracture, stable syndesmosis s/p ORIF                                      NWB R LEx x 8 weeks             Splint x 2 weeks then begin ankle ROM             Unrestricted Knee ROM              PT/OT please teach HEP for R LEx              Ice and elevate             Up with assistance              Will try to obtain knee scooter for home   Will order CAM boot that pt will need to bring to first follow up appointment      - Pain management:             tylenol 1000mg  po q6h  Oxycodone 5-15 mg po q6h prn severe pain   Robaxin 500 mg 1-2 po q6h prn spasms   - ABL anemia/Hemodynamics             Stable   - Medical issues              Insulin-dependent diabetes                         insulin pump                          Sugar control much improved   - DVT/PE prophylaxis:             Lovenox x 21 days   - ID:              Perioperative Ancef completed    - Metabolic Bone Disease:             vitamin d insufficiency   Hypogonadism     Patient with vitamin D insufficiency as well as hypogonadism setting of insulin-dependent diabetes mellitus. Will initiate vitamin D therapy with 5000 IUs of vitamin D3 daily. Hopefully this will make some improvement in his testosterone however likely needs further endocrinologic workup to evaluate his testosterone deficiency. Still awaiting remaining testosterone panel labs, including free testosterone. I would recommend repeating his testosterone panel in 4-6 weeks. If testosterone remains depressed will likely then require additional labs including LH and FSH To identify primary or secondary.  Would absolutely recommend bone density scan as an outpatient  May also need to strongly consider pharmacologic agent for bone health. In this particular setting think I would favor Forteo over the other classes of medications due to its true anabolic effects   Patient already sees Dr. Bobbie Stack endocrinology. Will copy him on this note as well as a discharge summary.  - Activity:             Nonweightbearing right leg   - FEN/GI prophylaxis/Foley/Lines:  CHO mod diet             sodium improved    - Dispo:  Dc home today   Follow up with ortho in 2 weeks        Jari Pigg, PA-C [facility] [phone] (O) 11/01/2016, 10:15 AM

## 2016-11-01 NOTE — Progress Notes (Signed)
PT Cancellation Note  Patient Details Name: Johnathan Ross MRN: [id number] DOB: Sep 02, 1970   Cancelled Treatment:    Reason Eval/Treat Not Completed: Patient declined, no reason specified; patient reports plans to ascend stairs on his knees and does not want to practice with PT.  States already up in room earlier with OT.  Educated on stairs forwards with crutch and rail and provided handout.  Pt continued to refuse to practice. Noted plans for d/c and states not planning any follow up therapy.    Reginia Naas 11/01/2016, 12:20 PM  Magda Kiel, [place] 11/01/2016

## 2016-11-03 ENCOUNTER — Encounter (HOSPITAL_COMMUNITY): Payer: Self-pay | Admitting: Emergency Medicine

## 2016-11-03 ENCOUNTER — Emergency Department (HOSPITAL_COMMUNITY): Payer: Medicare Other

## 2016-11-03 ENCOUNTER — Emergency Department (HOSPITAL_COMMUNITY)
Admission: EM | Admit: 2016-11-03 | Discharge: 2016-11-03 | Disposition: A | Discharge status: Home / Self Care | Payer: Medicare Other | Attending: Emergency Medicine | Admitting: Emergency Medicine

## 2016-11-03 DIAGNOSIS — Z79899 Other long term (current) drug therapy: Secondary | ICD-10-CM | POA: Insufficient documentation

## 2016-11-03 DIAGNOSIS — R140 Abdominal distension (gaseous): Secondary | ICD-10-CM | POA: Diagnosis not present

## 2016-11-03 DIAGNOSIS — E109 Type 1 diabetes mellitus without complications: Secondary | ICD-10-CM | POA: Insufficient documentation

## 2016-11-03 DIAGNOSIS — E785 Hyperlipidemia, unspecified: Secondary | ICD-10-CM | POA: Insufficient documentation

## 2016-11-03 DIAGNOSIS — E039 Hypothyroidism, unspecified: Secondary | ICD-10-CM | POA: Insufficient documentation

## 2016-11-03 DIAGNOSIS — I10 Essential (primary) hypertension: Secondary | ICD-10-CM | POA: Insufficient documentation

## 2016-11-03 DIAGNOSIS — Z794 Long term (current) use of insulin: Secondary | ICD-10-CM | POA: Insufficient documentation

## 2016-11-03 DIAGNOSIS — R1084 Generalized abdominal pain: Secondary | ICD-10-CM | POA: Diagnosis not present

## 2016-11-03 DIAGNOSIS — K5900 Constipation, unspecified: Secondary | ICD-10-CM | POA: Diagnosis not present

## 2016-11-03 DIAGNOSIS — E7800 Pure hypercholesterolemia, unspecified: Secondary | ICD-10-CM | POA: Diagnosis not present

## 2016-11-03 LAB — CBC WITH DIFFERENTIAL/PLATELET
Basophils Absolute: 0 10*3/uL (ref 0.0–0.1)
Basophils Relative: 0 %
Eosinophils Absolute: 0.3 10*3/uL (ref 0.0–0.7)
Eosinophils Relative: 2 %
HCT: 37 % — ABNORMAL LOW (ref 39.0–52.0)
Hemoglobin: 12.2 g/dL — ABNORMAL LOW (ref 13.0–17.0)
Lymphocytes Relative: 9 %
Lymphs Abs: 1.3 10*3/uL (ref 0.7–4.0)
MCH: 28.6 pg (ref 26.0–34.0)
MCHC: 33 g/dL (ref 30.0–36.0)
MCV: 86.9 fL (ref 78.0–100.0)
Monocytes Absolute: 0.7 10*3/uL (ref 0.1–1.0)
Monocytes Relative: 5 %
Neutro Abs: 12.1 10*3/uL — ABNORMAL HIGH (ref 1.7–7.7)
Neutrophils Relative %: 84 %
Platelets: 329 10*3/uL (ref 150–400)
RBC: 4.26 MIL/uL (ref 4.22–5.81)
RDW: 13.2 % (ref 11.5–15.5)
WBC: 14.4 10*3/uL — ABNORMAL HIGH (ref 4.0–10.5)

## 2016-11-03 LAB — COMPREHENSIVE METABOLIC PANEL
ALT: 25 U/L (ref 17–63)
AST: 39 U/L (ref 15–41)
Albumin: 3.6 g/dL (ref 3.5–5.0)
Alkaline Phosphatase: 85 U/L (ref 38–126)
Anion gap: 12 (ref 5–15)
BUN: 15 mg/dL (ref 6–20)
CO2: 24 mmol/L (ref 22–32)
Calcium: 9.1 mg/dL (ref 8.9–10.3)
Chloride: 97 mmol/L — ABNORMAL LOW (ref 101–111)
Creatinine, Ser: 1.35 mg/dL — ABNORMAL HIGH (ref 0.61–1.24)
GFR calc Af Amer: >60 mL/min (ref >60)
GFR calc non Af Amer: >60 mL/min (ref >60)
Glucose, Bld: 209 mg/dL — ABNORMAL HIGH (ref 65–99)
Potassium: 4 mmol/L (ref 3.5–5.1)
Sodium: 133 mmol/L — ABNORMAL LOW (ref 135–145)
Total Bilirubin: 0.8 mg/dL (ref 0.3–1.2)
Total Protein: 6.7 g/dL (ref 6.5–8.1)

## 2016-11-03 LAB — URINALYSIS, ROUTINE W REFLEX MICROSCOPIC
Bacteria, UA: NONE SEEN
Bilirubin Urine: NEGATIVE
Glucose, UA: >=500 mg/dL — AB
Ketones, ur: 5 mg/dL — AB
Leukocytes, UA: NEGATIVE
Nitrite: NEGATIVE
Protein, ur: NEGATIVE mg/dL
Specific Gravity, Urine: 1.009 (ref 1.005–1.030)
Squamous Epithelial / LPF: NONE SEEN
pH: 5 (ref 5.0–8.0)

## 2016-11-03 LAB — LIPASE, BLOOD: Lipase: 14 U/L (ref 11–51)

## 2016-11-03 MED ORDER — SODIUM CHLORIDE 0.9 % IV SOLN: INTRAVENOUS | Status: DC

## 2016-11-03 MED ORDER — LORAZEPAM 2 MG/ML IJ SOLN
1.0000 mg | Freq: Once | INTRAMUSCULAR | Status: AC
  Administered 2016-11-03: 1 mg via INTRAVENOUS
  Filled 2016-11-03: qty 1

## 2016-11-03 MED ORDER — MILK AND MOLASSES ENEMA
1.0000 | Freq: Once | RECTAL | Status: AC
  Administered 2016-11-03: 250 mL via RECTAL
  Filled 2016-11-03: qty 250

## 2016-11-03 NOTE — ED Triage Notes (Signed)
Patient reports worsening lower abdominal pain and low back pain onset this week , denies nausea or emesis , no fever or chills  pt. added constipation unrelieved by stool softener ( last BM 5 days ago) , recent right ankle surgery 10/30/2016.

## 2016-11-03 NOTE — ED Notes (Signed)
Family at bedside. girlfriend

## 2016-11-03 NOTE — ED Notes (Signed)
After MD disimpacted pt, this nurse infused enema.  Pt now sitting on bedside commode.

## 2016-11-03 NOTE — ED Provider Notes (Signed)
[facility] DEPT Provider Note   CSN: [id number] Arrival date & time: 11/03/16  5809     History   Chief Complaint Chief Complaint  Patient presents with  . Abdominal Pain  . Constipation    HPI Johnathan Ross is a 46 y.o. male.  46 year old presents with constipation times several days. Denies any fever or vomiting. Recent ankle surgery and has been using opiates. States he feels like his abdominal distention and can't fully empty his bladder. He denies Flank pain. Has not been passing flatus. Has not using medications to relieve her symptoms. Otherwise worse.      Past Medical History:  Diagnosis Date  . Hyperlipidemia   . Hypertension   . Hypogonadism in male 11/01/2016  . Hypothyroidism   . Multiple sclerosis ([place])   . 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 Type 1 diabetes mellitus ([place]) dx'd 1994    Patient Active Problem List   Diagnosis Date Noted  . Hypogonadism in male 11/01/2016  . Type 1 diabetes mellitus ([place])   . Hypothyroidism   . Hypertension   . Hyperlipidemia   . Closed fracture of right fibula and tibia 10/29/2016  . Closed tibia fracture 10/29/2016  . Closed fracture of right tibial plafond with fibula involvement 10/29/2016  . Abnormal liver function tests 03/06/2013  . RASH AND OTHER NONSPECIFIC SKIN ERUPTION 10/07/2009  . SHOULDER PAIN, LEFT 12/27/2008  . Type I (juvenile type) diabetes mellitus without mention of complication, uncontrolled 12/03/2007  . HYPOTHYROIDISM 11/12/2007  . HYPERCHOLESTEROLEMIA 11/12/2007  . MULTIPLE SCLEROSIS 08/29/2007  . PROLIFERATIVE DIABETIC RETINOPATHY 08/29/2007  . HYPERTENSION 12/25/2006    Past Surgical History:  Procedure Laterality Date  . EYE SURGERY Bilateral    "laser for diabetic retinopathy"  . FRACTURE SURGERY    . OPEN REDUCTION INTERNAL FIXATION (ORIF) TIBIA/FIBULA FRACTURE Right 10/30/2016  . ORIF ANKLE FRACTURE Left 2015  . ORIF TIBIA FRACTURE Right 10/30/2016   Procedure:  OPEN REDUCTION INTERNAL FIXATION (ORIF) TIBIA FIBULA FRACTURE;  Surgeon: Altamese Tripp, MD;  Location: [facility];  Service: Orthopedics;  Laterality: Right;  . RETINAL LASER PROCEDURE Bilateral    "for diabetic retinopathy"       Home Medications    Prior to Admission medications   Medication Sig Start Date End Date Taking? Authorizing Provider  acetaminophen (TYLENOL) 500 MG tablet Take 2 tablets (1,000 mg total) by mouth every 6 (six) hours. 11/01/16   Ainsley Spinner, PA-C  dalfampridine (AMPYRA) 10 MG TB12 Take 10 mg by mouth See admin instructions. Take one tablet (10 mg) by mouth every morning and mid afternoon    [provider]  docusate sodium (COLACE) 100 MG capsule Take 1 capsule (100 mg total) by mouth 2 (two) times daily. 11/01/16   Ainsley Spinner, PA-C  enoxaparin (LOVENOX) 40 MG/0.4ML injection Inject 0.4 mLs (40 mg total) into the skin daily. 11/02/16   Ainsley Spinner, PA-C  glucose blood (BAYER CONTOUR NEXT TEST) test strip USE AS INSTRUCTED TO CHECK BLOOD SUGARS 8 TIMES PER DAY DX CODE E10.65 08/31/16   Elayne Snare, MD  Insulin Human (INSULIN PUMP) SOLN Inject into the skin continuous. Novolog    [provider]  levothyroxine (SYNTHROID) 125 MCG tablet Take 1 tablet (125 mcg total) by mouth daily before breakfast. Patient taking differently: Take 125 mcg by mouth at bedtime.  07/11/16   Elayne Snare, MD  lisinopril (PRINIVIL,ZESTRIL) 10 MG tablet TAKE 1 TABLET BY MOUTH EVERY DAY Patient taking differently: TAKE 1 TABLET BY MOUTH  DAILY AT BEDTIME 08/27/16   Elayne Snare, MD  methocarbamol (ROBAXIN) 500 MG tablet Take 1-2 tablets (500-1,000 mg total) by mouth every 6 (six) hours as needed for muscle spasms. 11/01/16   Ainsley Spinner, PA-C  oxyCODONE (OXY IR/ROXICODONE) 5 MG immediate release tablet Take 1-3 tablets (5-15 mg total) by mouth every 6 (six) hours as needed for moderate pain, severe pain or breakthrough pain (5 mg for mild pain, 10 mg for moderate pain, 15 mg for severe pain).  11/01/16   Ainsley Spinner, PA-C  simvastatin (ZOCOR) 20 MG tablet TAKE 1 TABLET BY MOUTH AT BEDTIME Patient taking differently: TAKE 1 TABLET (20 MG) BY MOUTH AT BEDTIME 09/13/16   Elayne Snare, MD    Family History Family History  Problem Relation Age of Onset  . Diabetes Sister   . Colon cancer Maternal Grandmother   . Colon polyps Maternal Uncle     Social History Social History  Substance Use Topics  . Smoking status: Never Smoker  . Smokeless tobacco: Never Used  . Alcohol use No     Allergies   Patient has no known allergies.   Review of Systems Review of Systems  All other systems reviewed and are negative.    Physical Exam Updated Vital Signs BP (!) 162/98   Pulse (!) 110   Temp 98.9 F (37.2 C) (Oral)   Resp 18   Ht 1.854 m (6\' 1" )   Wt 97.5 kg (215 lb)   SpO2 98%   BMI 28.37 kg/m   Physical Exam  Constitutional: He is oriented to person, place, and time. He appears well-developed and well-nourished.  Non-toxic appearance. No distress.  HENT:  Head: Normocephalic and atraumatic.  Eyes: Conjunctivae, EOM and lids are normal. Pupils are equal, round, and reactive to light.  Neck: Normal range of motion. Neck supple. No tracheal deviation present. No thyroid mass present.  Cardiovascular: Regular rhythm and normal heart sounds.  Tachycardia present.  Exam reveals no gallop.   No murmur heard. Pulmonary/Chest: Effort normal and breath sounds normal. No stridor. No respiratory distress. He has no decreased breath sounds. He has no wheezes. He has no rhonchi. He has no rales.  Abdominal: Soft. Normal appearance and bowel sounds are normal. He exhibits distension. There is generalized tenderness. There is no rebound and no CVA tenderness.    Musculoskeletal: Normal range of motion. He exhibits no edema or tenderness.  Neurological: He is alert and oriented to person, place, and time. He has normal strength. No cranial nerve deficit or sensory deficit. GCS eye  subscore is 4. GCS verbal subscore is 5. GCS motor subscore is 6.  Skin: Skin is warm and dry. No abrasion and no rash noted.  Psychiatric: He has a normal mood and affect. His speech is normal and behavior is normal.  Nursing note and vitals reviewed.    ED Treatments / Results  Labs (all labs ordered are listed, but only abnormal results are displayed) Labs Reviewed  CBC WITH DIFFERENTIAL/PLATELET  COMPREHENSIVE METABOLIC PANEL  LIPASE, BLOOD  URINALYSIS, ROUTINE W REFLEX MICROSCOPIC    EKG  EKG Interpretation None       Radiology No results found.  Procedures Procedures (including critical care time)  Medications Ordered in ED Medications  0.9 %  sodium chloride infusion (not administered)     Initial Impression / Assessment and Plan / ED Course  I have reviewed the triage vital signs and the nursing notes.  Pertinent labs & imaging results that  were available during my care of the patient were reviewed by me and considered in my medical decision making (see chart for details).     Patient given enema 2 here with good result. Long discussion with family about return precautions.  Final Clinical Impressions(s) / ED Diagnoses   Final diagnoses:  None    New Prescriptions New Prescriptions   No medications on file     Lacretia Leigh, MD 11/03/16 1551

## 2016-11-03 NOTE — Discharge Instructions (Signed)
Purchase magnesium citrate after local drugstore. Use an enema if you note increased abdominal pressure. Return here at once if he developed fever, vomiting, severe pain

## 2016-11-04 LAB — TESTOSTERONE, % FREE: Testosterone-% Free: 2.2 % — ABNORMAL HIGH (ref 0.2–0.7)

## 2016-11-14 DIAGNOSIS — S82871D Displaced pilon fracture of right tibia, subsequent encounter for closed fracture with routine healing: Secondary | ICD-10-CM | POA: Diagnosis not present

## 2016-11-24 ENCOUNTER — Other Ambulatory Visit: Payer: Self-pay | Admitting: Endocrinology

## 2016-11-29 ENCOUNTER — Encounter (HOSPITAL_COMMUNITY): Payer: Self-pay | Admitting: Orthopedic Surgery

## 2016-11-29 ENCOUNTER — Other Ambulatory Visit: Payer: Self-pay | Admitting: Endocrinology

## 2016-11-29 DIAGNOSIS — E559 Vitamin D deficiency, unspecified: Secondary | ICD-10-CM

## 2016-11-29 HISTORY — DX: Vitamin D deficiency, unspecified: E55.9

## 2016-12-05 DIAGNOSIS — S82871D Displaced pilon fracture of right tibia, subsequent encounter for closed fracture with routine healing: Secondary | ICD-10-CM | POA: Diagnosis not present

## 2016-12-06 ENCOUNTER — Other Ambulatory Visit (INDEPENDENT_AMBULATORY_CARE_PROVIDER_SITE_OTHER): Payer: Medicare Other

## 2016-12-06 DIAGNOSIS — E063 Autoimmune thyroiditis: Secondary | ICD-10-CM

## 2016-12-06 DIAGNOSIS — E1065 Type 1 diabetes mellitus with hyperglycemia: Secondary | ICD-10-CM

## 2016-12-06 LAB — BASIC METABOLIC PANEL
BUN: 13 mg/dL (ref 6–23)
CO2: 29 mEq/L (ref 19–32)
Calcium: 9.6 mg/dL (ref 8.4–10.5)
Chloride: 104 mEq/L (ref 96–112)
Creatinine, Ser: 1.21 mg/dL (ref 0.40–1.50)
GFR: 83.65 mL/min (ref >60.00)
Glucose, Bld: 120 mg/dL — ABNORMAL HIGH (ref 70–99)
Potassium: 4.5 mEq/L (ref 3.5–5.1)
Sodium: 139 mEq/L (ref 135–145)

## 2016-12-06 LAB — HEMOGLOBIN A1C: Hgb A1c MFr Bld: 7.6 % — ABNORMAL HIGH (ref 4.6–6.5)

## 2016-12-06 LAB — TSH: TSH: 9.07 u[IU]/mL — ABNORMAL HIGH (ref 0.35–4.50)

## 2016-12-11 ENCOUNTER — Encounter: Payer: Self-pay | Admitting: Endocrinology

## 2016-12-11 ENCOUNTER — Ambulatory Visit: Payer: Medicare Other | Admitting: Endocrinology

## 2016-12-11 ENCOUNTER — Ambulatory Visit (INDEPENDENT_AMBULATORY_CARE_PROVIDER_SITE_OTHER): Payer: Medicare Other | Admitting: Endocrinology

## 2016-12-11 VITALS — BP 112/66 | HR 91 | Ht 73.0 in | Wt 210.4 lb

## 2016-12-11 DIAGNOSIS — E1065 Type 1 diabetes mellitus with hyperglycemia: Secondary | ICD-10-CM | POA: Diagnosis not present

## 2016-12-11 DIAGNOSIS — E7800 Pure hypercholesterolemia, unspecified: Secondary | ICD-10-CM | POA: Diagnosis not present

## 2016-12-11 DIAGNOSIS — I10 Essential (primary) hypertension: Secondary | ICD-10-CM

## 2016-12-11 DIAGNOSIS — E063 Autoimmune thyroiditis: Secondary | ICD-10-CM | POA: Diagnosis not present

## 2016-12-11 NOTE — Patient Instructions (Signed)
Extra thyroid pills 2x per week

## 2016-12-11 NOTE — Progress Notes (Signed)
Subjective:              Patient ID: Johnathan Ross, male   DOB: Oct 03, 1970, 46 y.o.   MRN: [id number]  Diabetes    Diagnosis: Type 1 diabetes mellitus, date of diagnosis: 1995.   Insulin Pump followup:   CURRENT brand:  Medtronic  630 G   HISTORY: An insulin pump has been in use since 03/13/11.   PUMP settings are basal rate:   1.5 until 4 a.m; 4 AM = 1.35. 6 a.m.= 1.7. 9 AM = 1.4,11 a.m. = 0.9. 12 noon = 1.2, 2 p.m. = 1. 45. 3 PM = 2.15 and 6 p.m. = 1.8   Carbohydrate to insulin ratio: 1: 10, at 11 a.m. = 1: 10, after 6 p.m. = 1:7, hyperglycemia correction factor: 1: 30,and after 10 p.m. 1:30, target 120, active insulin time 4 hours.  Changing infusion set every 3-4 days  Recent history:  His A1c has gone up to 8% In February, Now back to 7.6   Current blood sugar patterns Evaluated from both pump download and continues glucose sensor download, management and problems identified:   Blood sugar analysis from CGM is listed below with AVERAGE blood sugar 148 compared to 142 previously  He has more blood sugars within the range of 70-180 now  He says that after his fracture about 5 weeks ago his blood sugars were much higher and he started using temporary basal rate of 200% on his own and subsequently cut it down to 150 %  Has not used this in the last 2 weeks  However BOLUSES have been inconsistent and he did not understand the need to continue to boluses even with basal rate changes  FASTING blood sugars recently have been low normal or slightly low and blood sugars appear to be getting downwards around 6 AM until about 10 AM  HIGHEST blood sugars are usually between 12 PM-2 PM with the shop rising blood sugar after 10 AM  Some of his high readings in the mornings are related to either not bolusing at breakfast or eating high glycemic foods like cereal and grits without any protein now  Post prandial readings after his lunch and dinner meals are variable depending on  whether he is bolusing are not  However generally appears to have somewhat higher readings later at night and appears to be needing boluses to correct his high readings after 10 PM frequently   Although his carbohydrates are not being entered consistently he is trying to eat smaller portions and healthier meals   Has an occasional day where he has no mealtime bolus recorded  He thinks he can detect hypoglycemia symptoms fairly regularly Treatment of hypoglycemia: He is usually drinking juice for this  CGM results:  Mean values apply above for all meters except median for One Touch  PRE-MEAL Fasting Lunch Dinner Bedtime Overall  Glucose range:       Mean/median: 98  193  140  171  148    OVERNIGHT: Average readings are decreasing from 156 down to 131, 122 and 110 prior to waking up  Blood sugars are in target range 62% of the time, high 29% of the time and below 70 about 9% of the time with standard deviation 63 Fluctuation in blood sugars is most prominent late in the evenings after 10 PM and the least around 4 AM   FASTING readings from fingesticks before first bolus In the morning: range 63-327   EXERCISE: none recently  Wt Readings from Last 3 Encounters:  12/11/16 210 lb 6.4 oz (95.4 kg)  11/03/16 215 lb (97.5 kg)  10/29/16 214 lb 11.2 oz (97.4 kg)     Lab Results  Component Value Date   HGBA1C 7.6 (H) 12/06/2016   HGBA1C 8.0 (H) 07/06/2016   HGBA1C 7.6 (H) 03/27/2016   Lab Results  Component Value Date   MICROALBUR 1.6 10/27/2014   LDLCALC 69 09/07/2016   CREATININE 1.21 12/06/2016     OTHER active problems discussed today: See review of systems   Allergies as of 12/11/2016   No Known Allergies     Medication List       Accurate as of 12/11/16  4:04 PM. Always use your most recent med list.          acetaminophen 500 MG tablet Commonly known as:  TYLENOL Take 2 tablets (1,000 mg total) by mouth every 6 (six) hours.   AMPYRA 10 MG Tb12 Generic  drug:  dalfampridine Take 10 mg by mouth 2 (two) times daily. Take one tablet (10 mg) by mouth every morning and mid afternoon   insulin pump Soln Inject into the skin continuous. Novolog   levothyroxine 125 MCG tablet Commonly known as:  SYNTHROID, LEVOTHROID TAKE 1 TABLET EVERY DAY BEFORE BREAKFAST   lisinopril 10 MG tablet Commonly known as:  PRINIVIL,ZESTRIL TAKE 1 TABLET BY MOUTH EVERY DAY   NOVOLOG 100 UNIT/ML injection Generic drug:  insulin aspart Inject 80 Units into the skin as directed. Use until blood sugar is back regulated   simvastatin 20 MG tablet Commonly known as:  ZOCOR TAKE 1 TABLET BY MOUTH AT BEDTIME       Allergies: No Known Allergies  Past Medical History:  Diagnosis Date  . Hyperlipidemia   . Hypertension   . Hypogonadism in male 11/01/2016  . Hypothyroidism   . Multiple sclerosis ([place])   . 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 Type 1 diabetes mellitus ([place]) dx'd 1994  . Vitamin D insufficiency 11/29/2016    Past Surgical History:  Procedure Laterality Date  . EYE SURGERY Bilateral    "laser for diabetic retinopathy"  . FRACTURE SURGERY    . OPEN REDUCTION INTERNAL FIXATION (ORIF) TIBIA/FIBULA FRACTURE Right 10/30/2016  . ORIF ANKLE FRACTURE Left 2015  . ORIF TIBIA FRACTURE Right 10/30/2016   Procedure: OPEN REDUCTION INTERNAL FIXATION (ORIF) TIBIA FIBULA FRACTURE;  Surgeon: Altamese Creston, MD;  Location: [facility];  Service: Orthopedics;  Laterality: Right;  . RETINAL LASER PROCEDURE Bilateral    "for diabetic retinopathy"    Family History  Problem Relation Age of Onset  . Diabetes Sister   . Colon cancer Maternal Grandmother   . Colon polyps Maternal Uncle     Social History:  reports that he has never smoked. He has never used smokeless tobacco. He reports that he does not drink alcohol or use drugs.    Review of Systems      HYPERCHOLESTEROLEMIA: He has been taking simvastatin 20 mg  Liver functions are normal  consistently With this   Lab Results  Component Value Date   CHOL 116 09/07/2016   HDL 38.70 (L) 09/07/2016   LDLCALC 69 09/07/2016   LDLDIRECT 139.7 04/09/2014   TRIG 42.0 09/07/2016   CHOLHDL 3 09/07/2016   Lab Results  Component Value Date   ALT 25 11/03/2016    HYPERTENSION:  has  been on lisinopril with  good control  He has MULTIPLE sclerosis, Followed in [facility] by neurologist  HYPOTHYROIDISM: Currently on 125 g, 8 tablets weekly  His thyroid requirement has changed periodically and has been Needing generally higher doses this year  Even with adding another tablet once a week he is still having a high TSH although not having any symptoms of unusual fatigue    Lab Results  Component Value Date   TSH 9.07 (H) 12/06/2016   TSH 3.653 10/31/2016   TSH 38.18 (H) 07/06/2016   FREET4 0.90 08/03/2015   FREET4 0.79 10/27/2014        Objective:   Physical Exam  BP 112/66   Pulse 91   Ht 6\' 1"  (1.854 m)   Wt 210 lb 6.4 oz (95.4 kg)   SpO2 98%   BMI 27.76 kg/m          Assessment:      DIABETES type I on insulin pump:  See history of present illness for analysis of his current blood sugar patterns and problems  He has had difficulty getting consistent control over the last couple of months because of his ankle fracture and higher readings after the surgery On his own he had tried to use a temporary basal to improve his blood sugar control but information from about a month ago is not available today He is however not aware of needing to take boluses consistently even with changing basal rates He is still not bolusing consistently and especially in the morning has marked hyperglycemia postprandially with unbalanced meals HIGHEST blood sugars are in the prelunch.  Starting to go up at about 10 AM Blood sugars are variable after evening meal but has a tendency to relatively high readings at bedtime and these taking extra boluses May have HYPOGLYCEMIA  early morning also, relatively mild  Discussed day-to-day management with insulin pump with adjustment of boluses and basal rates and better compliance with carbohydrate intake, mealtime boluses and balanced meals with working especially in the morning   HYPOTHYROIDISM:  His thyroid levels have been markedly variable, not clear if this is due to some medications he has used for MS More recently has needed progressively higher doses TSH is still about 9 with the equivalent of about 143 g levothyroxine daily Subjectively doing well  HYPERTENSION: His blood pressure is well controlled    LIPIDS: Well controlled with simvastatin without any abnormalities of liver function   Plan:      He will need to reduce his basal rate at 6 AM down to 1.55 to prevent early morning hypoglycemia He will INCREASE basal rate at 10 AM for 2 hours until 12 noon Increase basal rate at 10 PM up to 1.9  He needs to start bolusing for every meal regardless of basal rate or previous blood sugar He needs to start adding protein to breakfast and not just eat cereal or grits He will call if he is having higher postprandial readings in the morning and change carbohydrate ratio  Continue freestyle Libre sensor Encouraged him to continue eating healthy meals and avoid low fat meals  Increase levothyroxine up to 9 tablets of 125 g TSH weekly and consider changing prescription on the next visit  Follow-up in 6 weeks for reassessment   Patient Instructions  Extra thyroid pills 2x per week   Counseling time on subjects discussed in assessment and plan sections is over 50% of today's 25 minute visit    Zahir Eisenhour

## 2016-12-24 ENCOUNTER — Ambulatory Visit: Payer: Medicare Other | Attending: Orthopedic Surgery | Admitting: Physical Therapy

## 2016-12-24 ENCOUNTER — Encounter: Payer: Self-pay | Admitting: Physical Therapy

## 2016-12-24 DIAGNOSIS — R262 Difficulty in walking, not elsewhere classified: Secondary | ICD-10-CM | POA: Insufficient documentation

## 2016-12-24 DIAGNOSIS — M25571 Pain in right ankle and joints of right foot: Secondary | ICD-10-CM

## 2016-12-24 DIAGNOSIS — R600 Localized edema: Secondary | ICD-10-CM

## 2016-12-24 DIAGNOSIS — M25671 Stiffness of right ankle, not elsewhere classified: Secondary | ICD-10-CM | POA: Insufficient documentation

## 2016-12-24 NOTE — Therapy (Signed)
[place], [place], [address] Phone: [phone]   Fax:  [phone]  Physical Therapy Evaluation  Patient Details  Name: Johnathan Ross MRN: [id number] Date of Birth: 1971-05-21 Referring Provider: Altamese [place], MD  Encounter Date: 12/24/2016      PT End of Session - 12/24/16 1244    Visit Number 1   Number of Visits 16   Date for PT Re-Evaluation 02/24/17   Authorization Type Kx modifier at visit 15, G-codes every 10th visit   PT Start Time 0930   PT Stop Time 1020   PT Time Calculation (min) 50 min   Activity Tolerance Patient tolerated treatment well   Behavior During Therapy [facility] for tasks assessed/performed      Past Medical History:  Diagnosis Date  . Hyperlipidemia   . Hypertension   . Hypogonadism in male 11/01/2016  . Hypothyroidism   . Multiple sclerosis ([place])   . 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 Type 1 diabetes mellitus ([place]) dx'd 1994  . Vitamin D insufficiency 11/29/2016    Past Surgical History:  Procedure Laterality Date  . EYE SURGERY Bilateral    "laser for diabetic retinopathy"  . FRACTURE SURGERY    . OPEN REDUCTION INTERNAL FIXATION (ORIF) TIBIA/FIBULA FRACTURE Right 10/30/2016  . ORIF ANKLE FRACTURE Left 2015  . ORIF TIBIA FRACTURE Right 10/30/2016   Procedure: OPEN REDUCTION INTERNAL FIXATION (ORIF) TIBIA FIBULA FRACTURE;  Surgeon: Altamese [place], MD;  Location: [facility];  Service: Orthopedics;  Laterality: Right;  . RETINAL LASER PROCEDURE Bilateral    "for diabetic retinopathy"    There were no vitals filed for this visit.       Subjective Assessment - 12/24/16 0935    Subjective Pt arriving to therpay today with family following s/p left ankle fx on 10/29/16. Pt reporting no pain at rest. Pt arriving to therapy in a wheel chair and CAM walker on the left. Pt also with MS. Pt reports having a walker at home for short distance amb.    Pertinent History MS  dx about 6 years ago   How long can you sit comfortably? unlimited   How long can you stand comfortably? unlimited   How long can you walk comfortably? not able   Diagnostic tests X-ray   Patient Stated Goals Walk without pain and without device   Currently in Pain? No/denies            [facility] PT Assessment - 12/24/16 0001      Assessment   Medical Diagnosis R tibia/fibula fx, s/p ORIF   Referring Provider Altamese [place], MD   Onset Date/Surgical Date 10/30/16   Hand Dominance Right   Prior Therapy yes, for past LE injury     Precautions   Precautions Other (comment)   Precaution Comments WBAT in CAM walker until released by MD   Required Braces or Orthoses Other Brace/Splint   Other Brace/Splint CAM walker     Restrictions   Weight Bearing Restrictions No   Other Position/Activity Restrictions CAM walker with amb     Balance Screen   Has the patient fallen in the past 6 months Yes   How many times? 2   Has the patient had a decrease in activity level because of a fear of falling?  Yes   Is the patient reluctant to leave their home because of a fear of falling?  No     Home Ecologist residence  Living Arrangements Non-relatives/Friends   Available Help at Discharge Family   Type of [place] to enter   Entrance Stairs-Number of Steps 5   Entrance Stairs-Rails Can reach both   [place] One level   [place] - 2 wheels     Prior Function   Level of [place] Independent  father reports h/o LOB    Vocation On disability   Leisure be with family     Cognition   Overall Cognitive Status Within Functional Limits for tasks assessed     Observation/Other Assessments   Focus on Therapeutic Outcomes (FOTO)  52% limitation     Observation/Other Assessments-Edema    Edema --  swelling noted in R ankle     Posture/Postural Control   Posture/Postural Control Postural limitations   Postural  Limitations Weight shift left   Posture Comments Pt with mild knee hyperextension during stance and standing on "y" ligaments for added support     ROM / Strength   AROM / PROM / Strength AROM;PROM;Strength     AROM   Overall AROM  Deficits   AROM Assessment Site Ankle   Right/Left Ankle Right   Right Ankle Dorsiflexion 0   Right Ankle Plantar Flexion 6   Right Ankle Inversion 5   Right Ankle Eversion 4     PROM   Overall PROM  Deficits   PROM Assessment Site Ankle   Right/Left Ankle Right   Right Ankle Dorsiflexion 5   Right Ankle Plantar Flexion 12   Right Ankle Inversion 10   Right Ankle Eversion 8     Strength   Overall Strength Deficits   Strength Assessment Site Hip;Ankle   Right/Left Hip Right;Left   Right Hip Flexion 3+/5   Right Hip ABduction 3+/5   Right Hip ADduction 3/5   Left Hip Flexion 3+/5   Left Hip ABduction 3+/5   Left Hip ADduction 3+/5   Right/Left Ankle Right   Right Ankle Dorsiflexion 2/5   Right Ankle Plantar Flexion 2/5   Right Ankle Inversion 2/5   Right Ankle Eversion 2/5     Transfers   Transfers Sit to Stand   Sit to Stand 4: Min guard     Ambulation/Gait   Ambulation/Gait Yes   Ambulation/Gait Assistance 4: Min guard   Ambulation Distance (Feet) 3 Feet   Assistive device None   Gait Pattern Step-to pattern   Ambulation Surface Level;Indoor   Gait Comments Pt has a RW at home, but didn't bring to session today            Objective measurements completed on examination: See above findings.          [place] Adult PT Treatment/Exercise - 12/24/16 0001      Exercises   Exercises Ankle     Ankle Exercises: Stretches   Gastroc Stretch 2 reps;30 seconds     Ankle Exercises: Seated   ABC's 1 rep;Limitations   ABC's Limitations limited ROM   Towel Crunch 2 reps;Limitations   Towel Crunch Weights (lbs) decreased toe flexion    Towel Inversion/Eversion 2 reps;Limitations                PT Education - 12/24/16  0939    Education provided Yes          PT Short Term Goals - 12/24/16 1251      PT SHORT TERM GOAL #1   Title pt will be independent in his initial  HEP   Time 3   Period Weeks   Status New   Target Date 01/14/17     PT SHORT TERM GOAL #2   Title pt will be able to amb with a RW 250 feet with step through gait pattern safely.    Time 3   Period Weeks   Status New   Target Date 01/14/17           PT Long Term Goals - 01-04-17 1253      PT LONG TERM GOAL #1   Title Pt will be able to amb > 500 feet with no assistive device on level surfaces with step through gait pattern.    Time 8   Period Weeks   Status New   Target Date 02/18/17     PT LONG TERM GOAL #2   Title Pt will improve his R dorsiflexion and plantar flexion to >/= 10 degrees   Baseline 4 degrees passively on 01/04/17   Time 8   Period Weeks   Status New   Target Date 02/18/17     PT LONG TERM GOAL #3   Title Pt will improve his bilateral hip strength to >/= 4+/5 in order to improve gait and functional mobility.    Time 8   Period Weeks   Status New   Target Date 02/18/17     PT LONG TERM GOAL #4   Title Pt will be able to peform sit to stand with no assistive device.    Time 8   Period Weeks   Status New   Target Date 02/18/17                Plan - 2017-01-04 1245    Clinical Impression Statement Patient arriving to therapy dx with MS and s/p ORIF of R ankle   History and Personal Factors relevant to plan of care: MS, DM   Clinical Presentation Stable   Clinical Decision Making Moderate   Rehab Potential Good   Clinical Impairments Affecting Rehab Potential MS    PT Frequency 2x / week   PT Duration 8 weeks   PT Treatment/Interventions ADLs/Self Care Home Management;Cryotherapy;Vasopneumatic Device;Taping;Gait training;Stair training;Functional mobility training;Therapeutic activities;Therapeutic exercise;Balance training;Neuromuscular re-education;Passive range of motion;Manual  techniques;Patient/family education;Electrical Stimulation   PT Next Visit Plan ROM/strengthening R ankle, recumbant bike, ankle isometrics and bilateral hip strengthening, hamstring stretching   PT Home Exercise Plan ABC's, Inversion/Eversion, Towel scrunches, hip flexion seated, calf stretches seated   Recommended Other Services Further assess balance    Consulted and Agree with Plan of Care Patient      Patient will benefit from skilled therapeutic intervention in order to improve the following deficits and impairments:  Abnormal gait, Pain, Decreased range of motion, Decreased balance, Decreased mobility, Difficulty walking, Decreased activity tolerance, Decreased strength  Visit Diagnosis: Acute right ankle pain  Stiffness of right ankle, not elsewhere classified  Difficulty in walking, not elsewhere classified  Localized edema      G-Codes - 04-Jan-2017 1304    Functional Assessment Tool Used (Outpatient Only) FOTO, clinical assessment   Functional Limitation Mobility: Walking and moving around   Mobility: Walking and Moving Around Current Status [phone]) At least 40 percent but less than 60 percent impaired, limited or restricted   Mobility: Walking and Moving Around Goal Status [phone]) At least 20 percent but less than 40 percent impaired, limited or restricted       Problem List Patient Active Problem List   Diagnosis Date Noted  .  Vitamin D insufficiency 11/29/2016  . Hypogonadism in male 11/01/2016  . Type 1 diabetes mellitus ([place])   . Hypothyroidism   . Hypertension   . Hyperlipidemia   . Closed fracture of right fibula and tibia 10/29/2016  . Closed tibia fracture 10/29/2016  . Closed fracture of right tibial plafond with fibula involvement 10/29/2016  . Abnormal liver function tests 03/06/2013  . RASH AND OTHER NONSPECIFIC SKIN ERUPTION 10/07/2009  . SHOULDER PAIN, LEFT 12/27/2008  . Type I (juvenile type) diabetes mellitus without mention of complication,  uncontrolled 12/03/2007  . HYPOTHYROIDISM 11/12/2007  . HYPERCHOLESTEROLEMIA 11/12/2007  . MULTIPLE SCLEROSIS 08/29/2007  . PROLIFERATIVE DIABETIC RETINOPATHY 08/29/2007  . HYPERTENSION 12/25/2006    Oretha Caprice , MPT 12/24/2016, 1:27 PM  [facility] [address] [place], [place], [address] Phone: [phone]   Fax:  [phone]  Name: JAREK LONGTON MRN: [id number] Date of Birth: [birth year]

## 2016-12-24 NOTE — Patient Instructions (Signed)
  Straighten your leg, keeping knee straight  Pull top of foot toward you Hold 30 seconds Repeat 5 times Twice a day    Write your ABC's twice a day    Towel scrunches:  Perform for 2-3 minutes Perform 2 times a day    You can perform as windshield wipers on a towel  Perform 2-3 minutes Twice a day    Seated marching: Lift your leg, hold up 3 seconds Alternate legs 40 times (20 on each leg) Twice a day

## 2017-01-02 DIAGNOSIS — S82871D Displaced pilon fracture of right tibia, subsequent encounter for closed fracture with routine healing: Secondary | ICD-10-CM | POA: Diagnosis not present

## 2017-01-03 ENCOUNTER — Encounter: Payer: Medicare Other | Admitting: Physical Therapy

## 2017-01-08 ENCOUNTER — Ambulatory Visit: Payer: Medicare Other | Attending: Orthopedic Surgery | Admitting: Physical Therapy

## 2017-01-08 DIAGNOSIS — M25571 Pain in right ankle and joints of right foot: Secondary | ICD-10-CM | POA: Insufficient documentation

## 2017-01-08 DIAGNOSIS — R262 Difficulty in walking, not elsewhere classified: Secondary | ICD-10-CM | POA: Insufficient documentation

## 2017-01-08 DIAGNOSIS — R600 Localized edema: Secondary | ICD-10-CM | POA: Diagnosis not present

## 2017-01-08 DIAGNOSIS — M25671 Stiffness of right ankle, not elsewhere classified: Secondary | ICD-10-CM | POA: Diagnosis not present

## 2017-01-08 NOTE — Therapy (Signed)
[facility] [place], [place], [address] Phone: [phone]   Fax:  [phone]  Physical Therapy Treatment  Patient Details  Name: Johnathan Ross MRN: [id number] Date of Birth: Jul 14, 1970 Referring Provider: Altamese [place], MD  Encounter Date: 01/08/2017      PT End of Session - 01/08/17 1141    Visit Number 2   Number of Visits 16   Date for PT Re-Evaluation 02/24/17   PT Start Time 6283   PT Stop Time 1132   PT Time Calculation (min) 47 min   Activity Tolerance Patient tolerated treatment well   Behavior During Therapy [facility] for tasks assessed/performed      Past Medical History:  Diagnosis Date  . Hyperlipidemia   . Hypertension   . Hypogonadism in male 11/01/2016  . Hypothyroidism   . Multiple sclerosis ([place])   . 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 Type 1 diabetes mellitus ([place]) dx'd 1994  . Vitamin D insufficiency 11/29/2016    Past Surgical History:  Procedure Laterality Date  . EYE SURGERY Bilateral    "laser for diabetic retinopathy"  . FRACTURE SURGERY    . OPEN REDUCTION INTERNAL FIXATION (ORIF) TIBIA/FIBULA FRACTURE Right 10/30/2016  . ORIF ANKLE FRACTURE Left 2015  . ORIF TIBIA FRACTURE Right 10/30/2016   Procedure: OPEN REDUCTION INTERNAL FIXATION (ORIF) TIBIA FIBULA FRACTURE;  Surgeon: Altamese Stonewall, MD;  Location: [facility];  Service: Orthopedics;  Laterality: Right;  . RETINAL LASER PROCEDURE Bilateral    "for diabetic retinopathy"    There were no vitals filed for this visit.      Subjective Assessment - 01/08/17 1043    Subjective No pain.  Saw MD.  He said he could wean from boot.  he went all day yesterday without boot,  Swelling is an issue show hardly fits.  He is doing the exercises, the alphabet give   Currently in Pain? No/denies                         [facility] Adult PT Treatment/Exercise - 01/08/17 0001      Knee/Hip Exercises: Supine   Bridges 10  reps   Straight Leg Raises 10 reps   Straight Leg Raises Limitations qued for quad set,  Patient reported being exhausted.    Other Supine Knee/Hip Exercises clam with red band 10 x issued for HEP.     Knee/Hip Exercises: Sidelying   Clams attempted, too hard     Manual Therapy   Manual Therapy --  educated on RICE,  And need to pace activity, avoid fatigue   Manual therapy comments retrorgade to assist edems, it did not help.  joint mobilization with movement into DF 10 X     Ankle Exercises: Seated   Heel Raises 10 reps  to stretch toes,  toes stiff   Heel Slides 10 reps  stiff, does not remain in contact with floor.       Ankle Exercises: Stretches   Plantar Fascia Stretch --  pro stretc     Ankle Exercises: Supine   Isometrics HEP   Other Supine Ankle Exercises 4 way isometrics 10 X 5 seconds.    Noted decreased edema in lower leg with this                PT Education - 01/08/17 1141    Education provided Yes   Education Details HEP and how to manage swelling,  activity with MS  precautions.   Person(s) Educated Patient  Family member, male   Methods Explanation;Demonstration   Comprehension Verbalized understanding          PT Short Term Goals - 12/24/16 1251      PT SHORT TERM GOAL #1   Title pt will be independent in his initial HEP   Time 3   Period Weeks   Status New   Target Date 01/14/17     PT SHORT TERM GOAL #2   Title pt will be able to amb with a RW 250 feet with step through gait pattern safely.    Time 3   Period Weeks   Status New   Target Date 01/14/17           PT Long Term Goals - 12/24/16 1253      PT LONG TERM GOAL #1   Title Pt will be able to amb > 500 feet with no assistive device on level surfaces with step through gait pattern.    Time 8   Period Weeks   Status New   Target Date 02/18/17     PT LONG TERM GOAL #2   Title Pt will improve his R dorsiflexion and plantar flexion to >/= 10 degrees   Baseline 4  degrees passively on 12/24/16   Time 8   Period Weeks   Status New   Target Date 02/18/17     PT LONG TERM GOAL #3   Title Pt will improve his bilateral hip strength to >/= 4+/5 in order to improve gait and functional mobility.    Time 8   Period Weeks   Status New   Target Date 02/18/17     PT LONG TERM GOAL #4   Title Pt will be able to peform sit to stand with no assistive device.    Time 8   Period Weeks   Status New   Target Date 02/18/17               Plan - 01/08/17 1142    Clinical Impression Statement progress toward HEP.  Patient now can wean from boot.  He will wear shoes to next visit,  No pain at end of session, however he was exhausted.   PT Next Visit Plan ROM/strengthening R ankle, recumbant bike, reviwe  ankle isometrics and bilateral hip strengthening, hamstring stretching  use low reps for quicker recovery time.  avoid excessive fatigue.    PT Home Exercise Plan ABC's, Inversion/Eversion, Towel scrunches, hip flexion seated, calf stretches seated,  isometrics   Consulted and Agree with Plan of Care Patient;Family member/caregiver      Patient will benefit from skilled therapeutic intervention in order to improve the following deficits and impairments:     Visit Diagnosis: Acute right ankle pain  Stiffness of right ankle, not elsewhere classified  Difficulty in walking, not elsewhere classified  Localized edema     Problem List Patient Active Problem List   Diagnosis Date Noted  . Vitamin D insufficiency 11/29/2016  . Hypogonadism in male 11/01/2016  . Type 1 diabetes mellitus ([place])   . Hypothyroidism   . Hypertension   . Hyperlipidemia   . Closed fracture of right fibula and tibia 10/29/2016  . Closed tibia fracture 10/29/2016  . Closed fracture of right tibial plafond with fibula involvement 10/29/2016  . Abnormal liver function tests 03/06/2013  . RASH AND OTHER NONSPECIFIC SKIN ERUPTION 10/07/2009  . SHOULDER PAIN, LEFT 12/27/2008   . Type I (juvenile type)  diabetes mellitus without mention of complication, uncontrolled 12/03/2007  . HYPOTHYROIDISM 11/12/2007  . HYPERCHOLESTEROLEMIA 11/12/2007  . MULTIPLE SCLEROSIS 08/29/2007  . PROLIFERATIVE DIABETIC RETINOPATHY 08/29/2007  . HYPERTENSION 12/25/2006    Hally Colella PTA 01/08/2017, 11:47 AM  [facility] [address] [place], [place], [address] Phone: [phone]   Fax:  [phone]  Name: DEZMOND DOWNIE MRN: [id number] Date of Birth: [birth year]

## 2017-01-08 NOTE — Patient Instructions (Signed)
Ankle isometrics issued from exercise drawer 10 X 5 seconds daily All issued

## 2017-01-10 ENCOUNTER — Ambulatory Visit: Payer: Medicare Other | Admitting: Physical Therapy

## 2017-01-10 ENCOUNTER — Encounter: Payer: Self-pay | Admitting: Physical Therapy

## 2017-01-10 DIAGNOSIS — R262 Difficulty in walking, not elsewhere classified: Secondary | ICD-10-CM

## 2017-01-10 DIAGNOSIS — M25571 Pain in right ankle and joints of right foot: Secondary | ICD-10-CM | POA: Diagnosis not present

## 2017-01-10 DIAGNOSIS — M25671 Stiffness of right ankle, not elsewhere classified: Secondary | ICD-10-CM

## 2017-01-10 DIAGNOSIS — R600 Localized edema: Secondary | ICD-10-CM

## 2017-01-10 NOTE — Therapy (Signed)
[facility] [place], [place], [address] Phone: [phone]   Fax:  [phone]  Physical Therapy Treatment  Patient Details  Name: Johnathan Ross MRN: [id number] Date of Birth: 02-19-1971 Referring Provider: Altamese Dotsero, MD  Encounter Date: 01/10/2017      PT End of Session - 01/10/17 1107    Visit Number 3   Number of Visits 16   Date for PT Re-Evaluation 02/24/17   Authorization Type Kx modifier at visit 15, G-codes every 10th visit   PT Start Time 1108  pt arrived late   PT Stop Time 1150   PT Time Calculation (min) 42 min   Activity Tolerance Patient tolerated treatment well   Behavior During Therapy [facility] for tasks assessed/performed      Past Medical History:  Diagnosis Date  . Hyperlipidemia   . Hypertension   . Hypogonadism in male 11/01/2016  . Hypothyroidism   . Multiple sclerosis ([place])   . 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 Type 1 diabetes mellitus ([place]) dx'd 1994  . Vitamin D insufficiency 11/29/2016    Past Surgical History:  Procedure Laterality Date  . EYE SURGERY Bilateral    "laser for diabetic retinopathy"  . FRACTURE SURGERY    . OPEN REDUCTION INTERNAL FIXATION (ORIF) TIBIA/FIBULA FRACTURE Right 10/30/2016  . ORIF ANKLE FRACTURE Left 2015  . ORIF TIBIA FRACTURE Right 10/30/2016   Procedure: OPEN REDUCTION INTERNAL FIXATION (ORIF) TIBIA FIBULA FRACTURE;  Surgeon: Altamese Spencer, MD;  Location: [facility];  Service: Orthopedics;  Laterality: Right;  . RETINAL LASER PROCEDURE Bilateral    "for diabetic retinopathy"    There were no vitals filed for this visit.      Subjective Assessment - 01/10/17 1108    Subjective Feeling a little weak today. No pain today, feels like something is moving- pointing to medial, distal aspect of ankle.                          [facility] Adult PT Treatment/Exercise - 01/10/17 0001      Manual Therapy   Manual therapy comments  edema mobilization, PROM ankle, toe stretching     Ankle Exercises: Supine   Other Supine Ankle Exercises leg elevated: toe curls, ankle pumps, inversion/eversion     Ankle Exercises: Seated   [place] Pickup 1 cup   Toe Raise Other (comment)  alt heel/toe raises   Other Seated Ankle Exercises toe yoga, towel scrunches   Other Seated Ankle Exercises rocker board PF/DF, pron/supination                PT Education - 01/10/17 1216    Education provided Yes   Education Details exercise form/rationale, HEP, healing and getting better with improving strength, edema & compression wear   Person(s) Educated Patient   Methods Explanation;Demonstration;Tactile cues;Verbal cues;Handout   Comprehension Verbalized understanding;Returned demonstration;Verbal cues required;Tactile cues required;Need further instruction          PT Short Term Goals - 12/24/16 1251      PT SHORT TERM GOAL #1   Title pt will be independent in his initial HEP   Time 3   Period Weeks   Status New   Target Date 01/14/17     PT SHORT TERM GOAL #2   Title pt will be able to amb with a RW 250 feet with step through gait pattern safely.    Time 3   Period Weeks   Status  New   Target Date 01/14/17           PT Long Term Goals - 12/24/16 1253      PT LONG TERM GOAL #1   Title Pt will be able to amb > 500 feet with no assistive device on level surfaces with step through gait pattern.    Time 8   Period Weeks   Status New   Target Date 02/18/17     PT LONG TERM GOAL #2   Title Pt will improve his R dorsiflexion and plantar flexion to >/= 10 degrees   Baseline 4 degrees passively on 12/24/16   Time 8   Period Weeks   Status New   Target Date 02/18/17     PT LONG TERM GOAL #3   Title Pt will improve his bilateral hip strength to >/= 4+/5 in order to improve gait and functional mobility.    Time 8   Period Weeks   Status New   Target Date 02/18/17     PT LONG TERM GOAL #4   Title Pt will be  able to peform sit to stand with no assistive device.    Time 8   Period Weeks   Status New   Target Date 02/18/17               Plan - 01/10/17 1212    Clinical Impression Statement Pt denied pain with exercises today, demo good control of movement. Limited activation of foot musculature to move toes. Increased walker height for improved posture. Discussed wearing a compression sock/stocking to manage edema.    PT Treatment/Interventions ADLs/Self Care Home Management;Cryotherapy;Vasopneumatic Device;Taping;Gait training;Stair training;Functional mobility training;Therapeutic activities;Therapeutic exercise;Balance training;Neuromuscular re-education;Passive range of motion;Manual techniques;Patient/family education;Electrical Stimulation   PT Next Visit Plan ROM/strengthening R ankle, recumbant bike, reviwe  ankle isometrics and bilateral hip strengthening, hamstring stretching  use low reps for quicker recovery time.  avoid excessive fatigue.    PT Home Exercise Plan ABC's, Inversion/Eversion, Towel scrunches, hip flexion seated, calf stretches seated,  isometrics; toe yoga, heel toe raises   Consulted and Agree with Plan of Care Patient;Family member/caregiver      Patient will benefit from skilled therapeutic intervention in order to improve the following deficits and impairments:  Abnormal gait, Pain, Decreased range of motion, Decreased balance, Decreased mobility, Difficulty walking, Decreased activity tolerance, Decreased strength  Visit Diagnosis: Acute right ankle pain  Stiffness of right ankle, not elsewhere classified  Difficulty in walking, not elsewhere classified  Localized edema     Problem List Patient Active Problem List   Diagnosis Date Noted  . Vitamin D insufficiency 11/29/2016  . Hypogonadism in male 11/01/2016  . Type 1 diabetes mellitus ([place])   . Hypothyroidism   . Hypertension   . Hyperlipidemia   . Closed fracture of right fibula and tibia  10/29/2016  . Closed tibia fracture 10/29/2016  . Closed fracture of right tibial plafond with fibula involvement 10/29/2016  . Abnormal liver function tests 03/06/2013  . RASH AND OTHER NONSPECIFIC SKIN ERUPTION 10/07/2009  . SHOULDER PAIN, LEFT 12/27/2008  . Type I (juvenile type) diabetes mellitus without mention of complication, uncontrolled 12/03/2007  . HYPOTHYROIDISM 11/12/2007  . HYPERCHOLESTEROLEMIA 11/12/2007  . MULTIPLE SCLEROSIS 08/29/2007  . PROLIFERATIVE DIABETIC RETINOPATHY 08/29/2007  . HYPERTENSION 12/25/2006    Pacen Watford C. Huber Mathers PT, DPT 01/10/17 12:17 PM   [facility] [address] [place], [place], [address] Phone: [phone]   Fax:  [phone]  Name: JIMMYLEE RATTERREE MRN: [id number] Date of Birth: 1970/07/01

## 2017-01-15 ENCOUNTER — Encounter: Payer: Self-pay | Admitting: Physical Therapy

## 2017-01-15 ENCOUNTER — Ambulatory Visit: Payer: Medicare Other | Admitting: Physical Therapy

## 2017-01-15 DIAGNOSIS — M25571 Pain in right ankle and joints of right foot: Secondary | ICD-10-CM

## 2017-01-15 DIAGNOSIS — R262 Difficulty in walking, not elsewhere classified: Secondary | ICD-10-CM | POA: Diagnosis not present

## 2017-01-15 DIAGNOSIS — R600 Localized edema: Secondary | ICD-10-CM

## 2017-01-15 DIAGNOSIS — M25671 Stiffness of right ankle, not elsewhere classified: Secondary | ICD-10-CM

## 2017-01-15 NOTE — Therapy (Signed)
[facility] [place], [place], [address] Phone: [phone]   Fax:  [phone]  Physical Therapy Treatment  Patient Details  Name: Johnathan Ross MRN: [id number] Date of Birth: 25-Apr-1971 Referring Provider: Altamese [place], MD  Encounter Date: 01/15/2017      PT End of Session - 01/15/17 1327    Visit Number 4   Number of Visits 16   Date for PT Re-Evaluation 02/24/17   PT Start Time 1103   PT Stop Time 1145   PT Time Calculation (min) 42 min   Activity Tolerance Patient tolerated treatment well   Behavior During Therapy [facility] for tasks assessed/performed      Past Medical History:  Diagnosis Date  . Hyperlipidemia   . Hypertension   . Hypogonadism in male 11/01/2016  . Hypothyroidism   . Multiple sclerosis ([place])   . 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 Type 1 diabetes mellitus ([place]) dx'd 1994  . Vitamin D insufficiency 11/29/2016    Past Surgical History:  Procedure Laterality Date  . EYE SURGERY Bilateral    "laser for diabetic retinopathy"  . FRACTURE SURGERY    . OPEN REDUCTION INTERNAL FIXATION (ORIF) TIBIA/FIBULA FRACTURE Right 10/30/2016  . ORIF ANKLE FRACTURE Left 2015  . ORIF TIBIA FRACTURE Right 10/30/2016   Procedure: OPEN REDUCTION INTERNAL FIXATION (ORIF) TIBIA FIBULA FRACTURE;  Surgeon: Altamese Creston, MD;  Location: [facility];  Service: Orthopedics;  Laterality: Right;  . RETINAL LASER PROCEDURE Bilateral    "for diabetic retinopathy"    There were no vitals filed for this visit.      Subjective Assessment - 01/15/17 1118    Subjective Feels weak from the MS,  Wakes up that way.  He is doing the exercises.  the ABC are the hardest.    Patient is accompained by: Family member   Currently in Pain? No/denies   Multiple Pain Sites No                         [facility] Adult PT Treatment/Exercise - 01/15/17 0001      Knee/Hip Exercises: Supine   Bridges 10 reps     Knee/Hip Exercises: Sidelying   Clams 10 X  smaller motions     Manual Therapy   Manual therapy comments mid foot  and rear foot soft tissue,  gentle mobilization     Ankle Exercises: Supine   Isometrics 4 way 10 x each,     Other Supine Ankle Exercises supination/pronation 5 X 2 sets trying to get increased heel mobility  ABC first name.  min assist holding leg to avoid compensatio     Ankle Exercises: Stretches   Other Stretch on step holding mid foot for mobilization with movement 1 X      Ankle Exercises: Standing   Other Standing Ankle Exercises facing wall.  wall slides weight shifting 5 X each cues for aviding locking knee into extension.      Ankle Exercises: Seated   Other Seated Ankle Exercises hamstring curl 5 x 2 sets yellow band                  PT Short Term Goals - 12/24/16 1251      PT SHORT TERM GOAL #1   Title pt will be independent in his initial HEP   Time 3   Period Weeks   Status New   Target Date 01/14/17     PT SHORT  TERM GOAL #2   Title pt will be able to amb with a RW 250 feet with step through gait pattern safely.    Time 3   Period Weeks   Status New   Target Date 01/14/17           PT Long Term Goals - 12/24/16 1253      PT LONG TERM GOAL #1   Title Pt will be able to amb > 500 feet with no assistive device on level surfaces with step through gait pattern.    Time 8   Period Weeks   Status New   Target Date 02/18/17     PT LONG TERM GOAL #2   Title Pt will improve his R dorsiflexion and plantar flexion to >/= 10 degrees   Baseline 4 degrees passively on 12/24/16   Time 8   Period Weeks   Status New   Target Date 02/18/17     PT LONG TERM GOAL #3   Title Pt will improve his bilateral hip strength to >/= 4+/5 in order to improve gait and functional mobility.    Time 8   Period Weeks   Status New   Target Date 02/18/17     PT LONG TERM GOAL #4   Title Pt will be able to peform sit to stand with no assistive device.     Time 8   Period Weeks   Status New   Target Date 02/18/17               Plan - 01/15/17 1327    Clinical Impression Statement Patient fatigues quickly today.  He needed encouragement for frequent rests.  He has weaned from hid boot,  DF increasing to 5+ degrees PROM.  AROM continues to be limited   PT Treatment/Interventions ADLs/Self Care Home Management;Cryotherapy;Vasopneumatic Device;Taping;Gait training;Stair training;Functional mobility training;Therapeutic activities;Therapeutic exercise;Balance training;Neuromuscular re-education;Passive range of motion;Manual techniques;Patient/family education;Electrical Stimulation   PT Next Visit Plan ROM/strengthening R ankle, recumbant bike, and bilateral hip strengthening, hamstring stretching  use low reps for quicker recovery time.  avoid excessive fatigue.    PT Home Exercise Plan ABC's, Inversion/Eversion, Towel scrunches, hip flexion seated, calf stretches seated,  isometrics; toe yoga, heel toe raises   Consulted and Agree with Plan of Care Patient      Patient will benefit from skilled therapeutic intervention in order to improve the following deficits and impairments:  Abnormal gait, Pain, Decreased range of motion, Decreased balance, Decreased mobility, Difficulty walking, Decreased activity tolerance, Decreased strength  Visit Diagnosis: Acute right ankle pain  Stiffness of right ankle, not elsewhere classified  Difficulty in walking, not elsewhere classified  Localized edema     Problem List Patient Active Problem List   Diagnosis Date Noted  . Vitamin D insufficiency 11/29/2016  . Hypogonadism in male 11/01/2016  . Type 1 diabetes mellitus ([place])   . Hypothyroidism   . Hypertension   . Hyperlipidemia   . Closed fracture of right fibula and tibia 10/29/2016  . Closed tibia fracture 10/29/2016  . Closed fracture of right tibial plafond with fibula involvement 10/29/2016  . Abnormal liver function tests  03/06/2013  . RASH AND OTHER NONSPECIFIC SKIN ERUPTION 10/07/2009  . SHOULDER PAIN, LEFT 12/27/2008  . Type I (juvenile type) diabetes mellitus without mention of complication, uncontrolled 12/03/2007  . HYPOTHYROIDISM 11/12/2007  . HYPERCHOLESTEROLEMIA 11/12/2007  . MULTIPLE SCLEROSIS 08/29/2007  . PROLIFERATIVE DIABETIC RETINOPATHY 08/29/2007  . HYPERTENSION 12/25/2006    Myrla Malanowski PTA 01/15/2017, 1:30  PM  [facility] [place], [place], [address] Phone: [phone]   Fax:  [phone]  Name: TENOCH MCCLURE MRN: [id number] Date of Birth: 04/26/1971

## 2017-01-17 ENCOUNTER — Encounter: Payer: Self-pay | Admitting: Physical Therapy

## 2017-01-17 ENCOUNTER — Ambulatory Visit: Payer: Medicare Other | Admitting: Physical Therapy

## 2017-01-17 DIAGNOSIS — M25671 Stiffness of right ankle, not elsewhere classified: Secondary | ICD-10-CM | POA: Diagnosis not present

## 2017-01-17 DIAGNOSIS — M25571 Pain in right ankle and joints of right foot: Secondary | ICD-10-CM

## 2017-01-17 DIAGNOSIS — R600 Localized edema: Secondary | ICD-10-CM

## 2017-01-17 DIAGNOSIS — R262 Difficulty in walking, not elsewhere classified: Secondary | ICD-10-CM | POA: Diagnosis not present

## 2017-01-17 NOTE — Therapy (Signed)
[place], [place], [address] Phone: [phone]   Fax:  [phone]  Physical Therapy Treatment  Patient Details  Name: Johnathan Ross MRN: [id number] Date of Birth: 22-Jan-1971 Referring Provider: Altamese [place], MD  Encounter Date: 01/17/2017      PT End of Session - 01/17/17 1059    Visit Number 5   Number of Visits 16   Date for PT Re-Evaluation 02/24/17   Authorization Type Kx modifier at visit 15, G-codes every 10th visit   PT Start Time 1059   PT Stop Time 1149   PT Time Calculation (min) 50 min   Activity Tolerance Patient tolerated treatment well   Behavior During Therapy [facility] for tasks assessed/performed      Past Medical History:  Diagnosis Date  . Hyperlipidemia   . Hypertension   . Hypogonadism in male 11/01/2016  . Hypothyroidism   . Multiple sclerosis ([place])   . 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 Type 1 diabetes mellitus ([place]) dx'd 1994  . Vitamin D insufficiency 11/29/2016    Past Surgical History:  Procedure Laterality Date  . EYE SURGERY Bilateral    "laser for diabetic retinopathy"  . FRACTURE SURGERY    . OPEN REDUCTION INTERNAL FIXATION (ORIF) TIBIA/FIBULA FRACTURE Right 10/30/2016  . ORIF ANKLE FRACTURE Left 2015  . ORIF TIBIA FRACTURE Right 10/30/2016   Procedure: OPEN REDUCTION INTERNAL FIXATION (ORIF) TIBIA FIBULA FRACTURE;  Surgeon: Altamese [place], MD;  Location: [place];  Service: Orthopedics;  Laterality: Right;  . RETINAL LASER PROCEDURE Bilateral    "for diabetic retinopathy"    There were no vitals filed for this visit.      Subjective Assessment - 01/17/17 1059    Subjective Feels tired today but not because of ankle. HAs been doing exercises.    Currently in Pain? No/denies                         [facility] Adult PT Treatment/Exercise - 01/17/17 0001      Modalities   Modalities Vasopneumatic     Vasopneumatic   Number Minutes  Vasopneumatic  15 minutes   Vasopnuematic Location  Ankle  R   Vasopneumatic Pressure Low   Vasopneumatic Temperature  40     Manual Therapy   Manual therapy comments edema mobilization, scar mobilization     Ankle Exercises: Aerobic   Stationary Bike stepper 3 min L3     Ankle Exercises: Supine   T-Band 4-way  AROM & red tband   Other Supine Ankle Exercises ABCs     Ankle Exercises: Standing   BAPS Level 1     Ankle Exercises: Seated   Other Seated Ankle Exercises heel/toe raises                PT Education - 01/17/17 1139    Education Details exercise form/rationale, HEP, scar mobilization, importance of elevation   Person(s) Educated Patient   Methods Explanation;Demonstration;Tactile cues;Verbal cues   Comprehension Verbalized understanding;Returned demonstration;Verbal cues required;Tactile cues required;Need further instruction          PT Short Term Goals - 12/24/16 1251      PT SHORT TERM GOAL #1   Title pt will be independent in his initial HEP   Time 3   Period Weeks   Status New   Target Date 01/14/17     PT SHORT TERM GOAL #2   Title pt will be able to  amb with a RW 250 feet with step through gait pattern safely.    Time 3   Period Weeks   Status New   Target Date 01/14/17           PT Long Term Goals - 12/24/16 1253      PT LONG TERM GOAL #1   Title Pt will be able to amb > 500 feet with no assistive device on level surfaces with step through gait pattern.    Time 8   Period Weeks   Status New   Target Date 02/18/17     PT LONG TERM GOAL #2   Title Pt will improve his R dorsiflexion and plantar flexion to >/= 10 degrees   Baseline 4 degrees passively on 12/24/16   Time 8   Period Weeks   Status New   Target Date 02/18/17     PT LONG TERM GOAL #3   Title Pt will improve his bilateral hip strength to >/= 4+/5 in order to improve gait and functional mobility.    Time 8   Period Weeks   Status New   Target Date 02/18/17      PT LONG TERM GOAL #4   Title Pt will be able to peform sit to stand with no assistive device.    Time 8   Period Weeks   Status New   Target Date 02/18/17               Plan - 01/17/17 1135    Clinical Impression Statement Notable improvement in PROM but difficulty with active. Feels like the swelling is getting in the way. Feels he can make edema go down with exercises and elevation. Regular rest breaks and vaso applied post session for edema control.    PT Treatment/Interventions ADLs/Self Care Home Management;Cryotherapy;Vasopneumatic Device;Taping;Gait training;Stair training;Functional mobility training;Therapeutic activities;Therapeutic exercise;Balance training;Neuromuscular re-education;Passive range of motion;Manual techniques;Patient/family education;Electrical Stimulation   PT Next Visit Plan ROM/strengthening R ankle, nu step, and bilateral hip strengthening, hamstring stretching  use low reps for quicker recovery time.  avoid excessive fatigue.    PT Home Exercise Plan ABC's, Inversion/Eversion, Towel scrunches, hip flexion seated, calf stretches seated,  isometrics; toe yoga, heel toe raises   Consulted and Agree with Plan of Care Patient      Patient will benefit from skilled therapeutic intervention in order to improve the following deficits and impairments:  Abnormal gait, Pain, Decreased range of motion, Decreased balance, Decreased mobility, Difficulty walking, Decreased activity tolerance, Decreased strength  Visit Diagnosis: Acute right ankle pain  Stiffness of right ankle, not elsewhere classified  Difficulty in walking, not elsewhere classified  Localized edema     Problem List Patient Active Problem List   Diagnosis Date Noted  . Vitamin D insufficiency 11/29/2016  . Hypogonadism in male 11/01/2016  . Type 1 diabetes mellitus ([place])   . Hypothyroidism   . Hypertension   . Hyperlipidemia   . Closed fracture of right fibula and tibia 10/29/2016   . Closed tibia fracture 10/29/2016  . Closed fracture of right tibial plafond with fibula involvement 10/29/2016  . Abnormal liver function tests 03/06/2013  . RASH AND OTHER NONSPECIFIC SKIN ERUPTION 10/07/2009  . SHOULDER PAIN, LEFT 12/27/2008  . Type I (juvenile type) diabetes mellitus without mention of complication, uncontrolled 12/03/2007  . HYPOTHYROIDISM 11/12/2007  . HYPERCHOLESTEROLEMIA 11/12/2007  . MULTIPLE SCLEROSIS 08/29/2007  . PROLIFERATIVE DIABETIC RETINOPATHY 08/29/2007  . HYPERTENSION 12/25/2006   Alan Riles C. Jahkari Maclin PT, DPT 01/17/17 11:40  AM   [facility] [address] [place], [place], [address] Phone: [phone]   Fax:  [phone]  Name: Johnathan Ross MRN: [id number] Date of Birth: [birth year]

## 2017-01-22 ENCOUNTER — Ambulatory Visit: Payer: Medicare Other | Admitting: Physical Therapy

## 2017-01-22 ENCOUNTER — Encounter: Payer: Self-pay | Admitting: Physical Therapy

## 2017-01-22 DIAGNOSIS — R262 Difficulty in walking, not elsewhere classified: Secondary | ICD-10-CM

## 2017-01-22 DIAGNOSIS — M25671 Stiffness of right ankle, not elsewhere classified: Secondary | ICD-10-CM

## 2017-01-22 DIAGNOSIS — M25571 Pain in right ankle and joints of right foot: Secondary | ICD-10-CM

## 2017-01-22 DIAGNOSIS — R600 Localized edema: Secondary | ICD-10-CM

## 2017-01-22 NOTE — Therapy (Signed)
[place], [place], [address] Phone: [phone]   Fax:  [phone]  Physical Therapy Treatment  Patient Details  Name: Johnathan Ross MRN: [id number] Date of Birth: [birth year] Referring Provider: Altamese [place], MD  Encounter Date: 01/22/2017      PT End of Session - 01/22/17 1310    Visit Number 6   Number of Visits 16   Date for PT Re-Evaluation 02/24/17   PT Start Time 1102   PT Stop Time 1147   PT Time Calculation (min) 45 min   Activity Tolerance Patient tolerated treatment well   Behavior During Therapy [facility] for tasks assessed/performed      Past Medical History:  Diagnosis Date  . Hyperlipidemia   . Hypertension   . Hypogonadism in male 11/01/2016  . Hypothyroidism   . Multiple sclerosis ([place])   . 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 Type 1 diabetes mellitus ([place]) dx'd 1994  . Vitamin D insufficiency 11/29/2016    Past Surgical History:  Procedure Laterality Date  . EYE SURGERY Bilateral    "laser for diabetic retinopathy"  . FRACTURE SURGERY    . OPEN REDUCTION INTERNAL FIXATION (ORIF) TIBIA/FIBULA FRACTURE Right 10/30/2016  . ORIF ANKLE FRACTURE Left 2015  . ORIF TIBIA FRACTURE Right 10/30/2016   Procedure: OPEN REDUCTION INTERNAL FIXATION (ORIF) TIBIA FIBULA FRACTURE;  Surgeon: Altamese [place], MD;  Location: [place];  Service: Orthopedics;  Laterality: Right;  . RETINAL LASER PROCEDURE Bilateral    "for diabetic retinopathy"    There were no vitals filed for this visit.      Subjective Assessment - 01/22/17 1303    Subjective No changes.  How much longer do I have to come here?  I have one more scheduled.   Currently in Pain? No/denies                         [facility] Adult PT Treatment/Exercise - 01/22/17 0001      Knee/Hip Exercises: Standing   Walking with Sports Cord resisted walking,  education family member,  10 Feet X 4   Other Standing Knee  Exercises Glut med strength standing,  cued 10 x each     Knee/Hip Exercises: Supine   Bridges 10 reps   Other Supine Knee/Hip Exercises clam 10 x red band     Knee/Hip Exercises: Sidelying   Clams 10 reps, small movements     Manual Therapy   Manual therapy comments scar tissue mobilization,  gentle stretching     Ankle Exercises: Standing   Heel Raises 10 reps   Toe Raise --  unable     Ankle Exercises: Seated   Toe Raise 10 reps     Ankle Exercises: Supine   T-Band 4-way  AROM & red tband  discussed to not over strength calf,  use of AFO for safety     Ankle Exercises: Stretches   Other Stretch pro stretch 3 minutes                PT Education - 01/22/17 1310    Education provided Yes   Education Details exercise rationale in general   Person(s) Educated Patient;Other (comment)   Methods Explanation   Comprehension Verbalized understanding          PT Short Term Goals - 01/22/17 1315      PT SHORT TERM GOAL #1   Title pt will be independent in his initial HEP  Baseline independent 01/22/2017   Time 3   Period Weeks   Status Achieved     PT SHORT TERM GOAL #2   Title pt will be able to amb with a RW 250 feet with step through gait pattern safely.    Baseline toe catches with gait in clinic,  walker keeps him safe   Time 3   Period Weeks   Status On-going           PT Long Term Goals - 12/24/16 1253      PT LONG TERM GOAL #1   Title Pt will be able to amb > 500 feet with no assistive device on level surfaces with step through gait pattern.    Time 8   Period Weeks   Status New   Target Date 02/18/17     PT LONG TERM GOAL #2   Title Pt will improve his R dorsiflexion and plantar flexion to >/= 10 degrees   Baseline 4 degrees passively on 12/24/16   Time 8   Period Weeks   Status New   Target Date 02/18/17     PT LONG TERM GOAL #3   Title Pt will improve his bilateral hip strength to >/= 4+/5 in order to improve gait and functional  mobility.    Time 8   Period Weeks   Status New   Target Date 02/18/17     PT LONG TERM GOAL #4   Title Pt will be able to peform sit to stand with no assistive device.    Time 8   Period Weeks   Status New   Target Date 02/18/17               Plan - 01/22/17 1311    Clinical Impression Statement Patient has an AFO and has felt increased safety with fatigue.  He was able to walk into a party without walker last week.  He wanter to build up calf today. Patient informed about  Muscle imbalance and the importance of continued work anteriorly and with flexibility. Patient declined the need for Vaso today.  he needed to rest frequently.  STG#1 met.   PT Treatment/Interventions ADLs/Self Care Home Management;Cryotherapy;Vasopneumatic Device;Taping;Gait training;Stair training;Functional mobility training;Therapeutic activities;Therapeutic exercise;Balance training;Neuromuscular re-education;Passive range of motion;Manual techniques;Patient/family education;Electrical Stimulation   PT Next Visit Plan One more visit scheduled.  Patient to bring his AFO. He wants to be D/C to HEP.  ROM/strengthening R ankle, nu step, and bilateral hip strengthening, hamstring stretching  use low reps for quicker recovery time.  avoid excessive fatigue.    PT Home Exercise Plan ABC's, Inversion/Eversion, Towel scrunches, hip flexion seated, calf stretches seated,  isometrics; toe yoga, heel toe raises      Patient will benefit from skilled therapeutic intervention in order to improve the following deficits and impairments:     Visit Diagnosis: Stiffness of right ankle, not elsewhere classified  Acute right ankle pain  Localized edema  Difficulty in walking, not elsewhere classified     Problem List Patient Active Problem List   Diagnosis Date Noted  . Vitamin D insufficiency 11/29/2016  . Hypogonadism in male 11/01/2016  . Type 1 diabetes mellitus ([place])   . Hypothyroidism   . Hypertension    . Hyperlipidemia   . Closed fracture of right fibula and tibia 10/29/2016  . Closed tibia fracture 10/29/2016  . Closed fracture of right tibial plafond with fibula involvement 10/29/2016  . Abnormal liver function tests 03/06/2013  . RASH AND  OTHER NONSPECIFIC SKIN ERUPTION 10/07/2009  . SHOULDER PAIN, LEFT 12/27/2008  . Type I (juvenile type) diabetes mellitus without mention of complication, uncontrolled 12/03/2007  . HYPOTHYROIDISM 11/12/2007  . HYPERCHOLESTEROLEMIA 11/12/2007  . MULTIPLE SCLEROSIS 08/29/2007  . PROLIFERATIVE DIABETIC RETINOPATHY 08/29/2007  . HYPERTENSION 12/25/2006    HARRIS,KAREN PTA 01/22/2017, 1:19 PM  [facility] [address] [place], [place], [address] Phone: [phone]   Fax:  [phone]  Name: Johnathan Ross MRN: [id number] Date of Birth: 07-09-1970

## 2017-01-24 ENCOUNTER — Encounter: Payer: Self-pay | Admitting: Physical Therapy

## 2017-01-24 ENCOUNTER — Ambulatory Visit: Payer: Medicare Other | Admitting: Physical Therapy

## 2017-01-24 DIAGNOSIS — R600 Localized edema: Secondary | ICD-10-CM

## 2017-01-24 DIAGNOSIS — M25571 Pain in right ankle and joints of right foot: Secondary | ICD-10-CM | POA: Diagnosis not present

## 2017-01-24 DIAGNOSIS — R262 Difficulty in walking, not elsewhere classified: Secondary | ICD-10-CM | POA: Diagnosis not present

## 2017-01-24 DIAGNOSIS — M25671 Stiffness of right ankle, not elsewhere classified: Secondary | ICD-10-CM

## 2017-01-24 NOTE — Therapy (Signed)
[place], [place], [address] Phone: [phone]   Fax:  [phone]  Physical Therapy Treatment  Patient Details  Name: Johnathan Ross MRN: [id number] Date of Birth: 1970-10-27 Referring Provider: Altamese [place], MD  Encounter Date: 01/24/2017      PT End of Session - 01/24/17 1100    Visit Number 7   Number of Visits 16   Date for PT Re-Evaluation 02/24/17   Authorization Type Kx modifier at visit 15, G-codes every 10th visit   PT Start Time 1100   PT Stop Time 1145   PT Time Calculation (min) 45 min   Activity Tolerance Patient tolerated treatment well   Behavior During Therapy [facility] for tasks assessed/performed      Past Medical History:  Diagnosis Date  . Hyperlipidemia   . Hypertension   . Hypogonadism in male 11/01/2016  . Hypothyroidism   . Multiple sclerosis ([place])   . 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 Type 1 diabetes mellitus ([place]) dx'd 1994  . Vitamin D insufficiency 11/29/2016    Past Surgical History:  Procedure Laterality Date  . EYE SURGERY Bilateral    "laser for diabetic retinopathy"  . FRACTURE SURGERY    . OPEN REDUCTION INTERNAL FIXATION (ORIF) TIBIA/FIBULA FRACTURE Right 10/30/2016  . ORIF ANKLE FRACTURE Left 2015  . ORIF TIBIA FRACTURE Right 10/30/2016   Procedure: OPEN REDUCTION INTERNAL FIXATION (ORIF) TIBIA FIBULA FRACTURE;  Surgeon: Altamese [place], MD;  Location: [place];  Service: Orthopedics;  Laterality: Right;  . RETINAL LASER PROCEDURE Bilateral    "for diabetic retinopathy"    There were no vitals filed for this visit.      Subjective Assessment - 01/24/17 1100    Subjective Reports ankle is feeling fine. Does not use walker outside of here but takes breaks in long distances (2 stops in about 50 yards).    Patient Stated Goals Walk without pain and without device   Currently in Pain? No/denies            [facility] PT Assessment - 01/24/17 0001       Observation/Other Assessments   Focus on Therapeutic Outcomes (FOTO)  45% limitation                     [facility] Adult PT Treatment/Exercise - 01/24/17 0001      Exercises   Exercises Knee/Hip     Knee/Hip Exercises: Aerobic   Nustep 5 min L3     Knee/Hip Exercises: Standing   Heel Raises Limitations hands on hips   Gait Training upright posture, heel strike & controlled knee ext   Other Standing Knee Exercises mini squats to control knee ext   Other Standing Knee Exercises glut sets     Knee/Hip Exercises: Sidelying   Hip ABduction 15 reps     Knee/Hip Exercises: Prone   Hamstring Curl 15 reps   Hip Extension 15 reps   Hip Extension Limitations with iso HS curl     Ankle Exercises: Seated   BAPS Level 3     Ankle Exercises: Standing   Other Standing Ankle Exercises weight shift- lat, A/P, circular                PT Education - 01/24/17 1201    Education provided Yes   Education Details monitored rest breaks & importance of regular breaks, postural control, gait pattern, transfer to neuro   Person(s) Educated Patient;Other (comment)   Methods Explanation;Demonstration;Tactile  cues;Verbal cues   Comprehension Verbalized understanding;Returned demonstration;Verbal cues required;Tactile cues required;Need further instruction          PT Short Term Goals - 01/22/17 1315      PT SHORT TERM GOAL #1   Title pt will be independent in his initial HEP   Baseline independent 01/22/2017   Time 3   Period Weeks   Status Achieved     PT SHORT TERM GOAL #2   Title pt will be able to amb with a RW 250 feet with step through gait pattern safely.    Baseline toe catches with gait in clinic,  walker keeps him safe   Time 3   Period Weeks   Status On-going           PT Long Term Goals - 12/24/16 1253      PT LONG TERM GOAL #1   Title Pt will be able to amb > 500 feet with no assistive device on level surfaces with step through gait pattern.     Time 8   Period Weeks   Status New   Target Date 02/18/17     PT LONG TERM GOAL #2   Title Pt will improve his R dorsiflexion and plantar flexion to >/= 10 degrees   Baseline 4 degrees passively on 12/24/16   Time 8   Period Weeks   Status New   Target Date 02/18/17     PT LONG TERM GOAL #3   Title Pt will improve his bilateral hip strength to >/= 4+/5 in order to improve gait and functional mobility.    Time 8   Period Weeks   Status New   Target Date 02/18/17     PT LONG TERM GOAL #4   Title Pt will be able to peform sit to stand with no assistive device.    Time 8   Period Weeks   Status New   Target Date 02/18/17               Plan - 02/18/17 1155    Clinical Impression Statement Pt did well with exercises today with monitored rest breaks. Poor balance requiring SBA with some assistance to recover LOB. Forward flexed posture  resulting in poor hamstring & glut activation and quick "snap back" of R knee. Pt feels more limited by fatigue of MS rather than strength/ROM of ankle. Pt is interested in transferring care to neurological PT for the duration of his POC. Encouraged pt to contact us with any further questions.    PT Treatment/Interventions ADLs/Self Care Home Management;Cryotherapy;Vasopneumatic Device;Taping;Gait training;Stair training;Functional mobility training;Therapeutic activities;Therapeutic exercise;Balance training;Neuromuscular re-education;Passive range of motion;Manual techniques;Patient/family education;Electrical Stimulation   PT Next Visit Plan transfer to neuro   PT Home Exercise Plan ABC's, Inversion/Eversion, Towel scrunches, hip flexion seated, calf stretches seated,  isometrics; toe yoga, heel toe raises; glut squeeze in standing, control knee ext in stance phase;    Consulted and Agree with Plan of Care Patient      Patient will benefit from skilled therapeutic intervention in order to improve the following deficits and impairments:   Abnormal gait, Pain, Decreased range of motion, Decreased balance, Decreased mobility, Difficulty walking, Decreased activity tolerance, Decreased strength  Visit Diagnosis: Stiffness of right ankle, not elsewhere classified  Acute right ankle pain  Localized edema  Difficulty in walking, not elsewhere classified       G-Codes - 02/18/2017 1159    Functional Assessment Tool Used (Outpatient Only) FOTO 45% limitation, clinical  assessment   Functional Limitation Mobility: Walking and moving around   Mobility: Walking and Moving Around Current Status [id number]) At least 40 percent but less than 60 percent impaired, limited or restricted   Mobility: Walking and Moving Around Goal Status [phone]) At least 20 percent but less than 40 percent impaired, limited or restricted      Problem List Patient Active Problem List   Diagnosis Date Noted  . Vitamin D insufficiency 11/29/2016  . Hypogonadism in male 11/01/2016  . Type 1 diabetes mellitus ([place])   . Hypothyroidism   . Hypertension   . Hyperlipidemia   . Closed fracture of right fibula and tibia 10/29/2016  . Closed tibia fracture 10/29/2016  . Closed fracture of right tibial plafond with fibula involvement 10/29/2016  . Abnormal liver function tests 03/06/2013  . RASH AND OTHER NONSPECIFIC SKIN ERUPTION 10/07/2009  . SHOULDER PAIN, LEFT 12/27/2008  . Type I (juvenile type) diabetes mellitus without mention of complication, uncontrolled 12/03/2007  . HYPOTHYROIDISM 11/12/2007  . HYPERCHOLESTEROLEMIA 11/12/2007  . MULTIPLE SCLEROSIS 08/29/2007  . PROLIFERATIVE DIABETIC RETINOPATHY 08/29/2007  . HYPERTENSION 12/25/2006    Jaionna Weisse C. Montray Kliebert PT, DPT 01/24/17 12:03 PM   [facility] [address] [place], [place], [address] Phone: [phone]   Fax:  [phone]  Name: Johnathan Ross MRN: [id number] Date of Birth: 21-Feb-1971

## 2017-01-30 ENCOUNTER — Encounter: Payer: Self-pay | Admitting: Physical Therapy

## 2017-01-30 ENCOUNTER — Ambulatory Visit: Payer: Medicare Other | Attending: Orthopedic Surgery | Admitting: Physical Therapy

## 2017-01-30 DIAGNOSIS — R2681 Unsteadiness on feet: Secondary | ICD-10-CM | POA: Insufficient documentation

## 2017-01-30 DIAGNOSIS — M6281 Muscle weakness (generalized): Secondary | ICD-10-CM | POA: Diagnosis not present

## 2017-01-30 DIAGNOSIS — M25671 Stiffness of right ankle, not elsewhere classified: Secondary | ICD-10-CM | POA: Diagnosis not present

## 2017-01-30 DIAGNOSIS — R262 Difficulty in walking, not elsewhere classified: Secondary | ICD-10-CM | POA: Diagnosis not present

## 2017-01-30 DIAGNOSIS — M25571 Pain in right ankle and joints of right foot: Secondary | ICD-10-CM

## 2017-01-30 DIAGNOSIS — R600 Localized edema: Secondary | ICD-10-CM | POA: Insufficient documentation

## 2017-01-30 NOTE — Patient Instructions (Signed)
SINGLE LIMB STANCE    Stance:STAND FACING A COUNTER-HOLD COUNTER WITH BOTH HANDS single leg on floor. Raise leg. Hold __10_ seconds. Repeat with other leg. _3_ reps per set, __2_ sets per day.  Tandem Stance    STAND FACING A COUNTER, HOLD TO COUNTER WITH BOTH HANDS.  Right foot in front of left, heel touching toe both feet "straight ahead". Balance in this position _12__ seconds. Do with left foot in front of right.  REPEAT 3 TIMES ON EACH SIDE.    Balance: Eyes Closed - Bilateral (Varied Surfaces)   Feet Together - Eyes Closed      With eyes closed and feet together, KEEP HEAD STILL-HOLD FOR 10-12 SECONDS.  HOLD CHAIR IN FRONT OF YOU FOR BALANCE Repeat 3 times per session. Do 2 sessions per day.

## 2017-01-30 NOTE — Therapy (Signed)
[facility] [address] [place], [place], [address] Phone: [phone]   Fax:  [phone]  Physical Therapy Treatment  Patient Details  Name: Johnathan Ross MRN: [id number] Date of Birth: 12/07/1970 Referring Provider: Altamese Luna, MD  Encounter Date: 01/30/2017      PT End of Session - 01/30/17 2120    Visit Number 8   Number of Visits 16   Date for PT Re-Evaluation 02/24/17   Authorization Type Kx modifier at visit 15, G-codes every 10th visit   PT Start Time 0936   PT Stop Time 1026   PT Time Calculation (min) 50 min   Activity Tolerance Patient tolerated treatment well   Behavior During Therapy [facility] for tasks assessed/performed      Past Medical History:  Diagnosis Date  . Hyperlipidemia   . Hypertension   . Hypogonadism in male 11/01/2016  . Hypothyroidism   . Multiple sclerosis ([place])   . 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 Type 1 diabetes mellitus ([place]) dx'd 1994  . Vitamin D insufficiency 11/29/2016    Past Surgical History:  Procedure Laterality Date  . EYE SURGERY Bilateral    "laser for diabetic retinopathy"  . FRACTURE SURGERY    . OPEN REDUCTION INTERNAL FIXATION (ORIF) TIBIA/FIBULA FRACTURE Right 10/30/2016  . ORIF ANKLE FRACTURE Left 2015  . ORIF TIBIA FRACTURE Right 10/30/2016   Procedure: OPEN REDUCTION INTERNAL FIXATION (ORIF) TIBIA FIBULA FRACTURE;  Surgeon: Altamese Newcastle, MD;  Location: [facility];  Service: Orthopedics;  Laterality: Right;  . RETINAL LASER PROCEDURE Bilateral    "for diabetic retinopathy"    There were no vitals filed for this visit.      Subjective Assessment - 01/30/17 0940    Subjective Pt presents to Neuro Outpatient PT to continue episode of care with more focus on treatment of MS symptoms.  Pt would like to focus on strengthening and balance.  Pt reports MS is well controlled with medication, has not had a flair recently.  Last flair was 2 years ago.      Patient is accompained by: Family member   Pertinent History MS dx about 6 years ago   How long can you sit comfortably? unlimited   How long can you stand comfortably? unlimited   How long can you walk comfortably? household distances due to LE fatigue/weakness   Diagnostic tests X-ray   Patient Stated Goals Walk without AD   Currently in Pain? No/denies            [facility] PT Assessment - 01/30/17 0945      Sensation   Light Touch Appears Intact     Strength   Overall Strength Deficits   Strength Assessment Site Hip;Ankle;Knee   Right/Left Hip Right;Left   Right Hip Flexion 3+/5   Left Hip Flexion 4-/5   Right/Left Knee Right;Left   Right Knee Flexion 3+/5   Right Knee Extension 4-/5   Left Knee Flexion 4-/5   Left Knee Extension 4/5   Right/Left Ankle Right;Left   Right Ankle Dorsiflexion 3-/5   Left Ankle Dorsiflexion 3+/5     Standardized Balance Assessment   Standardized Balance Assessment Five Times Sit to Stand;Berg Balance Test   Five times sit to stand comments  17.1, with use of UE from arm chair     Berg Balance Test   Sit to Stand Able to stand  independently using hands   Standing Unsupported Able to stand 2 minutes with supervision  Sitting with Back Unsupported but Feet Supported on Floor or Stool Able to sit safely and securely 2 minutes   Stand to Sit Controls descent by using hands   Transfers Able to transfer safely, definite need of hands   Standing Unsupported with Eyes Closed Able to stand 10 seconds with supervision   Standing Ubsupported with Feet Together Able to place feet together independently and stand for 1 minute with supervision   From Standing, Reach Forward with Outstretched Arm Can reach forward >12 cm safely (5")   From Standing Position, Pick up Object from [place] to pick up shoe, needs supervision   From Standing Position, Turn to Look Behind Over each Shoulder Looks behind one side only/other side shows less weight shift    Turn 360 Degrees Needs assistance while turning   Standing Unsupported, Alternately Place Feet on Step/Stool Able to complete >2 steps/needs minimal assist   Standing Unsupported, One Foot in Front Able to take small step independently and hold 30 seconds   Standing on One Leg Tries to lift leg/unable to hold 3 seconds but remains standing independently   Total Score 35   Berg comment: 35/56      Patient demonstrates increased fall risk as noted by score of  35/56 on Berg Balance Scale.  (<36= high risk for falls, close to 100%; 37-45 significant >80%; 46-51 moderate >50%; 52-55 lower >25%)                         PT Education - 01/30/17 2119    Education provided Yes   Education Details clinical findings and initiated balance HEP   Person(s) Educated Patient;Other (comment)   Methods Explanation;Demonstration;Handout   Comprehension Verbalized understanding;Returned demonstration          PT Short Term Goals - 01/22/17 1315      PT SHORT TERM GOAL #1   Title pt will be independent in his initial HEP   Baseline independent 01/22/2017   Time 3   Period Weeks   Status Achieved     PT SHORT TERM GOAL #2   Title pt will be able to amb with a RW 250 feet with step through gait pattern safely.    Baseline toe catches with gait in clinic,  walker keeps him safe   Time 3   Period Weeks   Status On-going           PT Long Term Goals - 01/30/17 2132      PT LONG TERM GOAL #1   Title Pt will be able to amb > 500 feet with no assistive device and supervision on level surfaces with step through gait pattern.    Time 8   Period Weeks   Status Revised   Target Date 02/18/17     PT LONG TERM GOAL #2   Title Pt will improve his R dorsiflexion and plantar flexion to >/= 10 degrees   Baseline 4 degrees passively on 12/24/16   Time 8   Period Weeks   Status Revised   Target Date 02/18/17     PT LONG TERM GOAL #3   Title Pt will improve his bilateral hip  strength to >/= 4+/5 in order to improve gait and functional mobility.    Time 8   Period Weeks   Status Revised   Target Date 02/18/17     PT LONG TERM GOAL #4   Title Pt will be able to peform sit  to stand with no assistive device and will perform five times sit to stand in <15 seconds to indicate improved LE strength and balance during changes in position   Baseline 17.1 seconds with use of UE   Time 8   Period Weeks   Status Revised   Target Date 02/18/17     PT LONG TERM GOAL #5   Title Pt will decrease falls risk as indicated by increase in BERG score to > or = 40/56   Baseline 35/56   Time 8   Period Weeks   Status Revised   Target Date 02/18/17     Additional Long Term Goals   Additional Long Term Goals Yes     PT LONG TERM GOAL #6   Title Pt will demonstrate ability to perform standing balance and strengthening HEP safely with supervision   Time 8   Period Weeks   Status New   Target Date 02/18/17     PT LONG TERM GOAL #7   Title gait velocity goal               Plan - 2017-02-27 09-21-2119    Clinical Impression Statement Pt presents to neuro outpatient PT for continuation of rehabilitation after ankle fracture from fall.  Pt wishes to address impairments related to MS including impaired balance, endurance and impaired strength.  Pt continues to present with increased edema, decreased strength in RLE, impaired balance, gait and activity tolerance and is at increased risk for falls as indicated by BERG balance assessment.  Pt provided with standing balance and strengthening exercises to add to HEP and discussed importance of performing HEP with someone present to supervise and importance of continuing ankle HEP provided to pt by previous PT and importance of ankle ROM and strength for balance and balance reactions.  Pt's goal is to return to community ambulation level without use of AD.  Will further assess gait and gait velocity at next session; pt noted to have  increased genu recurvatum on RLE.  Goals updated; will continue with POC.   Rehab Potential Good   Clinical Impairments Affecting Rehab Potential MS    PT Frequency 2x / week   PT Duration 8 weeks   PT Treatment/Interventions ADLs/Self Care Home Management;Cryotherapy;Vasopneumatic Device;Taping;Gait training;Stair training;Functional mobility training;Therapeutic activities;Therapeutic exercise;Balance training;Neuromuscular re-education;Passive range of motion;Manual techniques;Patient/family education;Electrical Stimulation   PT Next Visit Plan assess gait velocity/gait, stairs; review and add to balance HEP; standing balance/strengthening decreasing UE dependence.  Endurance training-monitor HR and fatigue level   PT Home Exercise Plan Continue with ankle HEP with addition of balance exercises   Consulted and Agree with Plan of Care Patient      Patient will benefit from skilled therapeutic intervention in order to improve the following deficits and impairments:  Abnormal gait, Pain, Decreased range of motion, Decreased balance, Decreased mobility, Difficulty walking, Decreased activity tolerance, Decreased strength  Visit Diagnosis: Difficulty in walking, not elsewhere classified  Localized edema  Acute right ankle pain  Stiffness of right ankle, not elsewhere classified       G-Codes - 02/27/17 2134-09-21    Functional Assessment Tool Used (Outpatient Only) BERG 35/56, five times sit to stand    Functional Limitation Mobility: Walking and moving around   Mobility: Walking and Moving Around Current Status [phone]) At least 20 percent but less than 40 percent impaired, limited or restricted   Mobility: Walking and Moving Around Goal Status (O9629) At least 20 percent but less than 40 percent impaired,  limited or restricted      Problem List Patient Active Problem List   Diagnosis Date Noted  . Vitamin D insufficiency 11/29/2016  . Hypogonadism in male 11/01/2016  . Type 1 diabetes  mellitus ([place])   . Hypothyroidism   . Hypertension   . Hyperlipidemia   . Closed fracture of right fibula and tibia 10/29/2016  . Closed tibia fracture 10/29/2016  . Closed fracture of right tibial plafond with fibula involvement 10/29/2016  . Abnormal liver function tests 03/06/2013  . RASH AND OTHER NONSPECIFIC SKIN ERUPTION 10/07/2009  . SHOULDER PAIN, LEFT 12/27/2008  . Type I (juvenile type) diabetes mellitus without mention of complication, uncontrolled 12/03/2007  . HYPOTHYROIDISM 11/12/2007  . HYPERCHOLESTEROLEMIA 11/12/2007  . MULTIPLE SCLEROSIS 08/29/2007  . PROLIFERATIVE DIABETIC RETINOPATHY 08/29/2007  . HYPERTENSION 12/25/2006    Raylene Everts, PT, DPT 01/30/17    9:38 PM    [facility] [address] [place], [place], [address] Phone: [phone]   Fax:  [phone]  Name: Johnathan Ross MRN: [id number] Date of Birth: 07/19/1970

## 2017-02-04 DIAGNOSIS — G35 Multiple sclerosis: Secondary | ICD-10-CM | POA: Diagnosis not present

## 2017-02-04 DIAGNOSIS — R5383 Other fatigue: Secondary | ICD-10-CM | POA: Diagnosis not present

## 2017-02-04 DIAGNOSIS — Z5181 Encounter for therapeutic drug level monitoring: Secondary | ICD-10-CM | POA: Diagnosis not present

## 2017-02-04 DIAGNOSIS — Z79899 Other long term (current) drug therapy: Secondary | ICD-10-CM | POA: Diagnosis not present

## 2017-02-06 ENCOUNTER — Encounter: Payer: Self-pay | Admitting: Physical Therapy

## 2017-02-06 ENCOUNTER — Ambulatory Visit: Payer: Medicare Other | Admitting: Physical Therapy

## 2017-02-06 VITALS — HR 86

## 2017-02-06 DIAGNOSIS — M25671 Stiffness of right ankle, not elsewhere classified: Secondary | ICD-10-CM | POA: Diagnosis not present

## 2017-02-06 DIAGNOSIS — R262 Difficulty in walking, not elsewhere classified: Secondary | ICD-10-CM | POA: Diagnosis not present

## 2017-02-06 DIAGNOSIS — M25571 Pain in right ankle and joints of right foot: Secondary | ICD-10-CM | POA: Diagnosis not present

## 2017-02-06 DIAGNOSIS — R2681 Unsteadiness on feet: Secondary | ICD-10-CM | POA: Diagnosis not present

## 2017-02-06 DIAGNOSIS — M6281 Muscle weakness (generalized): Secondary | ICD-10-CM | POA: Diagnosis not present

## 2017-02-06 DIAGNOSIS — R600 Localized edema: Secondary | ICD-10-CM | POA: Diagnosis not present

## 2017-02-06 NOTE — Patient Instructions (Signed)
SINGLE LIMB STANCE    Stance:STAND FACING A COUNTER-HOLD COUNTER WITH BOTH HANDS single leg on floor. Raise leg. Hold __10_ seconds. Repeat with other leg. _3_ reps per set, __2_ sets per day.  Tandem Stance    STAND FACING A COUNTER, HOLD TO COUNTER WITH BOTH HANDS.  Right foot in front of left, heel touching toe both feet "straight ahead". Balance in this position _12__ seconds. Do with left foot in front of right.  REPEAT 3 TIMES ON EACH SIDE.    Balance: Eyes Closed - Bilateral (Varied Surfaces)   Feet Together - Eyes Closed      With eyes closed and feet together, KEEP HEAD STILL-HOLD FOR 10-12 SECONDS.  HOLD CHAIR IN FRONT OF YOU FOR BALANCE Repeat 3 times per session. Do 2 sessions per day.

## 2017-02-06 NOTE — Therapy (Signed)
[facility] [address] [place], [place], [address] Phone: [phone]   Fax:  [phone]  Physical Therapy Treatment  Patient Details  Name: Johnathan Ross MRN: [id number] Date of Birth: 13-Apr-1971 Referring Provider: Altamese Rosston, MD  Encounter Date: 02/06/2017      PT End of Session - 02/06/17 1102    Visit Number 9   Number of Visits 16   Date for PT Re-Evaluation 02/24/17   Authorization Type Kx modifier at visit 15, G-codes every 10th visit   PT Start Time 0930   PT Stop Time 1015   PT Time Calculation (min) 45 min   Activity Tolerance Patient tolerated treatment well   Behavior During Therapy [facility] for tasks assessed/performed      Past Medical History:  Diagnosis Date  . Hyperlipidemia   . Hypertension   . Hypogonadism in male 11/01/2016  . Hypothyroidism   . Multiple sclerosis ([place])   . 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 Type 1 diabetes mellitus ([place]) dx'd 1994  . Vitamin D insufficiency 11/29/2016    Past Surgical History:  Procedure Laterality Date  . EYE SURGERY Bilateral    "laser for diabetic retinopathy"  . FRACTURE SURGERY    . OPEN REDUCTION INTERNAL FIXATION (ORIF) TIBIA/FIBULA FRACTURE Right 10/30/2016  . ORIF ANKLE FRACTURE Left 2015  . ORIF TIBIA FRACTURE Right 10/30/2016   Procedure: OPEN REDUCTION INTERNAL FIXATION (ORIF) TIBIA FIBULA FRACTURE;  Surgeon: Altamese [place], MD;  Location: [facility];  Service: Orthopedics;  Laterality: Right;  . RETINAL LASER PROCEDURE Bilateral    "for diabetic retinopathy"    Vitals:   02/06/17 1100 02/06/17 1101  Pulse: 88 86  SpO2: 99% 99%        Subjective Assessment - 02/06/17 1101    Subjective Pt has a right AFO to help with drop foot but only wore it occasionally because he reports little benefit with aiding foot drop when right LE became tired.   Patient is accompained by: Family member   Pertinent History MS dx about 6 years  ago   How long can you sit comfortably? unlimited   How long can you stand comfortably? unlimited   How long can you walk comfortably? household distances due to LE fatigue/weakness   Diagnostic tests X-ray   Patient Stated Goals Walk without AD   Currently in Pain? No/denies                         [facility] Adult PT Treatment/Exercise - 02/06/17 0001      Ambulation/Gait   Ambulation/Gait Yes   Ambulation/Gait Assistance 4: Min guard   Ambulation Distance (Feet) 40 Feet  x4   Assistive device Rolling walker;Straight cane  Pt required min A and more standing rest breaks to maintain balance when ambulating with SPC.   Gait Pattern Step-to pattern;Decreased step length - right;Decreased step length - left;Decreased stride length;Decreased hip/knee flexion - right;Decreased dorsiflexion - right;Right genu recurvatum   Ambulation Surface Level;Indoor   Gait velocity RW =1.96 ft/sec; SPC= 0.38ft/sec   Stairs Yes   Stairs Assistance 5: Supervision   Stairs Assistance Details (indicate cue type and reason) reciprocal up and step to pattern descending.   Pre-Gait Activities worked on endurance (46min) and balance and right LE control with knee  in stance and right foot in swing, and balance with decreasing UE support 2- 0 UE (intermittently)  Balance Exercises - 02/06/17 1220      Balance Exercises: Standing   Tandem Stance Eyes open;Upper extremity support 2;4 reps;30 secs   SLS Eyes open;Solid surface;Upper extremity support 2;2 reps;10 secs           PT Education - 02/06/17 1221    Education provided Yes   Education Details Verbally discussed current HEP and how balance exercises carries over into gait training.   Person(s) Educated Patient;Other (comment)  friend   Methods Explanation   Comprehension Verbalized understanding          PT Short Term Goals - 01/22/17 1315      PT SHORT TERM GOAL #1   Title pt  will be independent in his initial HEP   Baseline independent 01/22/2017   Time 3   Period Weeks   Status Achieved     PT SHORT TERM GOAL #2   Title pt will be able to amb with a RW 250 feet with step through gait pattern safely.    Baseline toe catches with gait in clinic,  walker keeps him safe   Time 3   Period Weeks   Status On-going           PT Long Term Goals - 01/30/17 2132      PT LONG TERM GOAL #1   Title Pt will be able to amb > 500 feet with no assistive device and supervision on level surfaces with step through gait pattern.    Time 8   Period Weeks   Status Revised   Target Date 02/18/17     PT LONG TERM GOAL #2   Title Pt will improve his R dorsiflexion and plantar flexion to >/= 10 degrees   Baseline 4 degrees passively on 12/24/16   Time 8   Period Weeks   Status Revised   Target Date 02/18/17     PT LONG TERM GOAL #3   Title Pt will improve his bilateral hip strength to >/= 4+/5 in order to improve gait and functional mobility.    Time 8   Period Weeks   Status Revised   Target Date 02/18/17     PT LONG TERM GOAL #4   Title Pt will be able to peform sit to stand with no assistive device and will perform five times sit to stand in <15 seconds to indicate improved LE strength and balance during changes in position   Baseline 17.1 seconds with use of UE   Time 8   Period Weeks   Status Revised   Target Date 02/18/17     PT LONG TERM GOAL #5   Title Pt will decrease falls risk as indicated by increase in BERG score to > or = 40/56   Baseline 35/56   Time 8   Period Weeks   Status Revised   Target Date 02/18/17     Additional Long Term Goals   Additional Long Term Goals Yes     PT LONG TERM GOAL #6   Title Pt will demonstrate ability to perform standing balance and strengthening HEP safely with supervision   Time 8   Period Weeks   Status New   Target Date 02/18/17     PT LONG TERM GOAL #7   Title gait velocity goal                Plan - 02/06/17 1222    Clinical Impression Statement Pt demonstrates understanding and follow through with current HEP (  including balance exercises).  Pt performed gait velocity with RW (1.29ft/sec) and SPC (0.36ft/sec) and required minA when ambulating with SPC and intermittent min guard when ambulating with RW when right foot would drag. Pt is awares on R genu recurvatum and during gait and balance exercises worked to gain greater right knee control but had a harder time with control as right LE fatigued.                                               Rehab Potential Good   Clinical Impairments Affecting Rehab Potential MS    PT Frequency 2x / week   PT Duration 8 weeks   PT Treatment/Interventions ADLs/Self Care Home Management;Cryotherapy;Vasopneumatic Device;Taping;Gait training;Stair training;Functional mobility training;Therapeutic activities;Therapeutic exercise;Balance training;Neuromuscular re-education;Passive range of motion;Manual techniques;Patient/family education;Electrical Stimulation   PT Next Visit Plan ; review and add to balance HEP; standing balance/strengthening decreasing UE dependence.  Endurance training-monitor HR and fatigue level   PT Home Exercise Plan Continue with ankle HEP with addition of balance exercises   Consulted and Agree with Plan of Care Patient      Patient will benefit from skilled therapeutic intervention in order to improve the following deficits and impairments:  Abnormal gait, Pain, Decreased range of motion, Decreased balance, Decreased mobility, Difficulty walking, Decreased activity tolerance, Decreased strength  Visit Diagnosis: Difficulty in walking, not elsewhere classified  Stiffness of right ankle, not elsewhere classified     Problem List Patient Active Problem List   Diagnosis Date Noted  . Vitamin D insufficiency 11/29/2016  . Hypogonadism in male 11/01/2016  . Type 1 diabetes mellitus ([place])   . Hypothyroidism   .  Hypertension   . Hyperlipidemia   . Closed fracture of right fibula and tibia 10/29/2016  . Closed tibia fracture 10/29/2016  . Closed fracture of right tibial plafond with fibula involvement 10/29/2016  . Abnormal liver function tests 03/06/2013  . RASH AND OTHER NONSPECIFIC SKIN ERUPTION 10/07/2009  . SHOULDER PAIN, LEFT 12/27/2008  . Type I (juvenile type) diabetes mellitus without mention of complication, uncontrolled 12/03/2007  . HYPOTHYROIDISM 11/12/2007  . HYPERCHOLESTEROLEMIA 11/12/2007  . MULTIPLE SCLEROSIS 08/29/2007  . PROLIFERATIVE DIABETIC RETINOPATHY 08/29/2007  . HYPERTENSION 12/25/2006   Bjorn Loser, PTA  02/06/17, 12:31 PM [facility] [address] [place], [place], [address] Phone: [phone]   Fax:  [phone]  Name: Johnathan Ross MRN: [id number] Date of Birth: 10-03-1970

## 2017-02-07 ENCOUNTER — Ambulatory Visit: Payer: Medicare Other | Admitting: Physical Therapy

## 2017-02-07 ENCOUNTER — Encounter: Payer: Self-pay | Admitting: Physical Therapy

## 2017-02-07 DIAGNOSIS — R262 Difficulty in walking, not elsewhere classified: Secondary | ICD-10-CM | POA: Diagnosis not present

## 2017-02-07 DIAGNOSIS — M25571 Pain in right ankle and joints of right foot: Secondary | ICD-10-CM | POA: Diagnosis not present

## 2017-02-07 DIAGNOSIS — R600 Localized edema: Secondary | ICD-10-CM | POA: Diagnosis not present

## 2017-02-07 DIAGNOSIS — M6281 Muscle weakness (generalized): Secondary | ICD-10-CM | POA: Diagnosis not present

## 2017-02-07 DIAGNOSIS — R2681 Unsteadiness on feet: Secondary | ICD-10-CM | POA: Diagnosis not present

## 2017-02-07 DIAGNOSIS — M25671 Stiffness of right ankle, not elsewhere classified: Secondary | ICD-10-CM | POA: Diagnosis not present

## 2017-02-07 NOTE — Patient Instructions (Addendum)
   Using the red band pull you right foot toward you as far as you can. Push against the band and then SLOWLY allow your foot to come back up to start position. Perform 10 reps. 1-2 times a day.

## 2017-02-08 ENCOUNTER — Other Ambulatory Visit: Payer: Medicare Other

## 2017-02-09 NOTE — Therapy (Signed)
[facility] [address] [place], [place], [address] Phone: [phone]   Fax:  [phone]  Physical Therapy Treatment  Patient Details  Name: Johnathan Ross MRN: [id number] Date of Birth: June 03, 1970 Referring Provider: Altamese [place]esburg, MD  Encounter Date: 02/07/2017   02/07/17 0851  PT Visits / Re-Eval  Visit Number 10 (G-code due visit number 18)  Number of Visits 16  Date for PT Re-Evaluation 02/24/17  Authorization  Authorization Type Kx modifier at visit 15, G-codes every 10th visit  PT Time Calculation  PT Start Time 0849  PT Stop Time 0930  PT Time Calculation (min) 41 min  PT - End of Session  Activity Tolerance Patient tolerated treatment well  Behavior During Therapy [facility] for tasks assessed/performed     Past Medical History:  Diagnosis Date  . Hyperlipidemia   . Hypertension   . Hypogonadism in male 11/01/2016  . Hypothyroidism   . Multiple sclerosis ([place])   . 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 Type 1 diabetes mellitus ([place]) dx'd 1994  . Vitamin D insufficiency 11/29/2016    Past Surgical History:  Procedure Laterality Date  . EYE SURGERY Bilateral    "laser for diabetic retinopathy"  . FRACTURE SURGERY    . OPEN REDUCTION INTERNAL FIXATION (ORIF) TIBIA/FIBULA FRACTURE Right 10/30/2016  . ORIF ANKLE FRACTURE Left 2015  . ORIF TIBIA FRACTURE Right 10/30/2016   Procedure: OPEN REDUCTION INTERNAL FIXATION (ORIF) TIBIA FIBULA FRACTURE;  Surgeon: Altamese Calabash, MD;  Location: [facility];  Service: Orthopedics;  Laterality: Right;  . RETINAL LASER PROCEDURE Bilateral    "for diabetic retinopathy"    There were no vitals filed for this visit.     02/07/17 0850  Symptoms/Limitations  Subjective No new complaints. No falls or pain to report.   Patient is accompained by: Family member  Pertinent History MS dx about 6 years ago  How long can you sit comfortably? unlimited  How long can you  walk comfortably? household distances due to LE fatigue/weakness  Diagnostic tests X-ray  Patient Stated Goals Walk without AD  Pain Assessment  Currently in Pain? No/denies      02/07/17 0856  Knee/Hip Exercises: Standing  Heel Raises Limitations UE support on counter top for balance.  Other Standing Knee Exercises sit<>stands without UE support x 10 reps with cues on full upright posture and slow, controlled descent with sitting down.   Heel Raises Both;1 set;10 reps;5 seconds;Limitations  Knee/Hip Exercises: Sidelying  Clams 10 reps with cues for slow, controlled movements  Knee/Hip Exercises: Prone  Hamstring Curl 1 set;10 reps;Limitations  Hip Extension AROM;Strengthening;Right;1 set;10 reps;Limitations  Hip Extension Limitations glut kick backs with knee flexion.  Hamstring Curl Limitations concurrent with glut kick backs     02/07/17 0920  Balance Exercises: Standing  Standing Eyes Opened Wide (BOA);Head turns;Foam/compliant surface;Other reps (comment);Limitations  Standing Eyes Closed Wide (BOA);Head turns;Foam/compliant surface;Other reps (comment);30 secs;Limitations  Balance Exercises: Standing  Standing Eyes Closed Limitations on airex in corner with chair in front for safety with hip width stance (wide BOS): EC no head movements, progressing to EO head movements left<>right and up<>down, progressing to EC head movements left<>right and up<>down. min guard to min assist for balance.        02/07/17 0928  PT Education  Education provided Yes  Education Details Added ankle strengthening exercise today  Person(s) Educated Patient;Parent(s) (dad)  Methods Explanation;Demonstration;Verbal cues;Handout  Comprehension Verbalized understanding;Returned demonstration;Need further instruction  PT Short Term Goals - 01/22/17 1315      PT SHORT TERM GOAL #1   Title pt will be independent in his initial HEP   Baseline independent 01/22/2017   Time 3   Period  Weeks   Status Achieved     PT SHORT TERM GOAL #2   Title pt will be able to amb with a RW 250 feet with step through gait pattern safely.    Baseline toe catches with gait in clinic,  walker keeps him safe   Time 3   Period Weeks   Status On-going           PT Long Term Goals - 01/30/17 2132      PT LONG TERM GOAL #1   Title Pt will be able to amb > 500 feet with no assistive device and supervision on level surfaces with step through gait pattern.    Time 8   Period Weeks   Status Revised   Target Date 02/18/17     PT LONG TERM GOAL #2   Title Pt will improve his R dorsiflexion and plantar flexion to >/= 10 degrees   Baseline 4 degrees passively on 12/24/16   Time 8   Period Weeks   Status Revised   Target Date 02/18/17     PT LONG TERM GOAL #3   Title Pt will improve his bilateral hip strength to >/= 4+/5 in order to improve gait and functional mobility.    Time 8   Period Weeks   Status Revised   Target Date 02/18/17     PT LONG TERM GOAL #4   Title Pt will be able to peform sit to stand with no assistive device and will perform five times sit to stand in <15 seconds to indicate improved LE strength and balance during changes in position   Baseline 17.1 seconds with use of UE   Time 8   Period Weeks   Status Revised   Target Date 02/18/17     PT LONG TERM GOAL #5   Title Pt will decrease falls risk as indicated by increase in BERG score to > or = 40/56   Baseline 35/56   Time 8   Period Weeks   Status Revised   Target Date 02/18/17     Additional Long Term Goals   Additional Long Term Goals Yes     PT LONG TERM GOAL #6   Title Pt will demonstrate ability to perform standing balance and strengthening HEP safely with supervision   Time 8   Period Weeks   Status New   Target Date 02/18/17     PT LONG TERM GOAL #7   Title gait velocity goal        02/07/17 0851  Plan  Clinical Impression Statement Today's skilled session continued to  focus on LE  strengthening and balance without any issues reported. Pt did require a few seated rest breaks between exercices/activities due to fatigue. Pt is progressing toward goals and should benefit from continued PT to progress toward unmet goals.   Pt will benefit from skilled therapeutic intervention in order to improve on the following deficits Abnormal gait;Pain;Decreased range of motion;Decreased balance;Decreased mobility;Difficulty walking;Decreased activity tolerance;Decreased strength  Rehab Potential Good  Clinical Impairments Affecting Rehab Potential MS   PT Frequency 2x / week  PT Duration 8 weeks  PT Treatment/Interventions ADLs/Self Care Home Management;Cryotherapy;Vasopneumatic Device;Taping;Gait training;Stair training;Functional mobility training;Therapeutic activities;Therapeutic exercise;Balance training;Neuromuscular re-education;Passive range of motion;Manual techniques;Patient/family education;Electrical  Stimulation  PT Next Visit Plan standing balance/strengthening decreasing UE dependence.  Endurance training-monitor HR and fatigue level  PT Home Exercise Plan Continue with ankle HEP with addition of balance exercises  Consulted and Agree with Plan of Care Patient          Patient will benefit from skilled therapeutic intervention in order to improve the following deficits and impairments:  Abnormal gait, Pain, Decreased range of motion, Decreased balance, Decreased mobility, Difficulty walking, Decreased activity tolerance, Decreased strength  Visit Diagnosis: Difficulty in walking, not elsewhere classified     Problem List Patient Active Problem List   Diagnosis Date Noted  . Vitamin D insufficiency 11/29/2016  . Hypogonadism in male 11/01/2016  . Type 1 diabetes mellitus ([place])   . Hypothyroidism   . Hypertension   . Hyperlipidemia   . Closed fracture of right fibula and tibia 10/29/2016  . Closed tibia fracture 10/29/2016  . Closed fracture of right tibial  plafond with fibula involvement 10/29/2016  . Abnormal liver function tests 03/06/2013  . RASH AND OTHER NONSPECIFIC SKIN ERUPTION 10/07/2009  . SHOULDER PAIN, LEFT 12/27/2008  . Type I (juvenile type) diabetes mellitus without mention of complication, uncontrolled 12/03/2007  . HYPOTHYROIDISM 11/12/2007  . HYPERCHOLESTEROLEMIA 11/12/2007  . MULTIPLE SCLEROSIS 08/29/2007  . PROLIFERATIVE DIABETIC RETINOPATHY 08/29/2007  . HYPERTENSION 12/25/2006    Willow Ora, PTA, [facility] [address], [place], [place] [address] [phone] 02/09/17, 12:42 PM   Name: Johnathan Ross MRN: [id number] Date of Birth: 1970-08-11

## 2017-02-11 ENCOUNTER — Ambulatory Visit: Payer: Medicare Other | Admitting: Endocrinology

## 2017-02-12 ENCOUNTER — Telehealth: Payer: Self-pay | Admitting: Endocrinology

## 2017-02-12 NOTE — Telephone Encounter (Signed)
Made in error

## 2017-02-13 ENCOUNTER — Encounter: Payer: Self-pay | Admitting: Physical Therapy

## 2017-02-13 ENCOUNTER — Ambulatory Visit: Payer: Medicare Other | Admitting: Physical Therapy

## 2017-02-13 DIAGNOSIS — R262 Difficulty in walking, not elsewhere classified: Secondary | ICD-10-CM | POA: Diagnosis not present

## 2017-02-13 DIAGNOSIS — M25571 Pain in right ankle and joints of right foot: Secondary | ICD-10-CM | POA: Diagnosis not present

## 2017-02-13 DIAGNOSIS — R2681 Unsteadiness on feet: Secondary | ICD-10-CM | POA: Diagnosis not present

## 2017-02-13 DIAGNOSIS — M6281 Muscle weakness (generalized): Secondary | ICD-10-CM | POA: Diagnosis not present

## 2017-02-13 DIAGNOSIS — M25671 Stiffness of right ankle, not elsewhere classified: Secondary | ICD-10-CM | POA: Diagnosis not present

## 2017-02-13 DIAGNOSIS — R600 Localized edema: Secondary | ICD-10-CM | POA: Diagnosis not present

## 2017-02-13 NOTE — Therapy (Signed)
[facility] [address] [place], [place], [address] Phone: [phone]   Fax:  [phone]  Physical Therapy Treatment  Patient Details  Name: Johnathan Ross MRN: [id number] Date of Birth: August 19, 1970 Referring Provider: Altamese Lumpkin, MD  Encounter Date: 02/13/2017      PT End of Session - 02/13/17 1130    Visit Number 11  G-code due visit number 18   Number of Visits 16   Date for PT Re-Evaluation 02/24/17   Authorization Type Kx modifier at visit 15, G-codes every 10th visit   PT Start Time 0933   PT Stop Time 1016   PT Time Calculation (min) 43 min   Activity Tolerance Patient tolerated treatment well   Behavior During Therapy [facility] for tasks assessed/performed      Past Medical History:  Diagnosis Date  . Hyperlipidemia   . Hypertension   . Hypogonadism in male 11/01/2016  . Hypothyroidism   . Multiple sclerosis ([place])   . 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 Type 1 diabetes mellitus ([place]) dx'd 1994  . Vitamin D insufficiency 11/29/2016    Past Surgical History:  Procedure Laterality Date  . EYE SURGERY Bilateral    "laser for diabetic retinopathy"  . FRACTURE SURGERY    . OPEN REDUCTION INTERNAL FIXATION (ORIF) TIBIA/FIBULA FRACTURE Right 10/30/2016  . ORIF ANKLE FRACTURE Left 2015  . ORIF TIBIA FRACTURE Right 10/30/2016   Procedure: OPEN REDUCTION INTERNAL FIXATION (ORIF) TIBIA FIBULA FRACTURE;  Surgeon: Altamese Harrah, MD;  Location: [facility];  Service: Orthopedics;  Laterality: Right;  . RETINAL LASER PROCEDURE Bilateral    "for diabetic retinopathy"    There were no vitals filed for this visit.      Subjective Assessment - 02/13/17 0935    Subjective Nothing new to report.   Patient is accompained by: Family member   Pertinent History MS dx about 6 years ago   How long can you sit comfortably? unlimited   How long can you walk comfortably? household distances due to LE  fatigue/weakness   Diagnostic tests X-ray   Patient Stated Goals Walk without AD   Currently in Pain? No/denies                         [facility] Adult PT Treatment/Exercise - 02/13/17 0001      Ambulation/Gait   Ambulation/Gait Yes   Ambulation/Gait Assistance 4: Min assist   Ambulation/Gait Assistance Details Working on balance and sequencing with cane; ambulated around perimeter of elevated mat, with intermittent UE support from mat.   Ambulation Distance (Feet) 70 Feet   Assistive device Straight cane   Gait Pattern Step-to pattern;Decreased step length - right;Decreased step length - left;Decreased stride length;Decreased hip/knee flexion - right;Decreased dorsiflexion - right;Right genu recurvatum   Ambulation Surface Level;Indoor             Balance Exercises - 02/13/17 0940      Balance Exercises: Standing   Retro Gait 5 reps  with SPC, step to pattern, cues to lead off with right then left leg   Sidestepping Upper extremity support;5 reps  attempting decreased UE support, cues for posture   Turning Right;Left  with cane   Other Standing Exercises Tall Kneel- Head turns, lateral weight shifting,  mini squats 10x3 (last set using weighted ball yellow),  transitions- quadruped to side sit x 10; Quadruped- alt. LE extension to hip/knee flexion (with stomach cruntch) 5x2  PT Education - 02/13/17 1129    Education provided Yes   Education Details On how working in tall kneel and quadruped work multiple muscle groups   Person(s) Educated Patient   Methods Explanation   Comprehension Verbalized understanding          PT Short Term Goals - 01/22/17 1315      PT SHORT TERM GOAL #1   Title pt will be independent in his initial HEP   Baseline independent 01/22/2017   Time 3   Period Weeks   Status Achieved     PT SHORT TERM GOAL #2   Title pt will be able to amb with a RW 250 feet with step through gait pattern  safely.    Baseline toe catches with gait in clinic,  walker keeps him safe   Time 3   Period Weeks   Status On-going           PT Long Term Goals - 01/30/17 2132      PT LONG TERM GOAL #1   Title Pt will be able to amb > 500 feet with no assistive device and supervision on level surfaces with step through gait pattern.    Time 8   Period Weeks   Status Revised   Target Date 02/18/17     PT LONG TERM GOAL #2   Title Pt will improve his R dorsiflexion and plantar flexion to >/= 10 degrees   Baseline 4 degrees passively on 12/24/16   Time 8   Period Weeks   Status Revised   Target Date 02/18/17     PT LONG TERM GOAL #3   Title Pt will improve his bilateral hip strength to >/= 4+/5 in order to improve gait and functional mobility.    Time 8   Period Weeks   Status Revised   Target Date 02/18/17     PT LONG TERM GOAL #4   Title Pt will be able to peform sit to stand with no assistive device and will perform five times sit to stand in <15 seconds to indicate improved LE strength and balance during changes in position   Baseline 17.1 seconds with use of UE   Time 8   Period Weeks   Status Revised   Target Date 02/18/17     PT LONG TERM GOAL #5   Title Pt will decrease falls risk as indicated by increase in BERG score to > or = 40/56   Baseline 35/56   Time 8   Period Weeks   Status Revised   Target Date 02/18/17     Additional Long Term Goals   Additional Long Term Goals Yes     PT LONG TERM GOAL #6   Title Pt will demonstrate ability to perform standing balance and strengthening HEP safely with supervision   Time 8   Period Weeks   Status New   Target Date 02/18/17     PT LONG TERM GOAL #7   Title gait velocity goal               Plan - 02/13/17 1131    Clinical Impression Statement Worked in core, balance, and hip strengthening in tall kneel and quadruped positions. Pt was challenged but did not report over fatigue, had intermittent rest breaks.   Pt required intermittent UE support for balance in tall kneel position.  Practised gait training with SPC; pt required intermittent min A and 2nd had support for balance with cues for  sequencing.                                                                            Rehab Potential Good   Clinical Impairments Affecting Rehab Potential MS    PT Frequency 2x / week   PT Duration 8 weeks   PT Treatment/Interventions ADLs/Self Care Home Management;Cryotherapy;Vasopneumatic Device;Taping;Gait training;Stair training;Functional mobility training;Therapeutic activities;Therapeutic exercise;Balance training;Neuromuscular re-education;Passive range of motion;Manual techniques;Patient/family education;Electrical Stimulation   PT Next Visit Plan Tall kneel, 1/2 kneel and quadruped positions for strength and balance.standing balance/strengthening decreasing UE dependence.  Endurance training-monitor HR and fatigue level   PT Home Exercise Plan Continue with ankle HEP with addition of balance exercises   Consulted and Agree with Plan of Care Patient      Patient will benefit from skilled therapeutic intervention in order to improve the following deficits and impairments:  Abnormal gait, Pain, Decreased range of motion, Decreased balance, Decreased mobility, Difficulty walking, Decreased activity tolerance, Decreased strength  Visit Diagnosis: Difficulty in walking, not elsewhere classified     Problem List Patient Active Problem List   Diagnosis Date Noted  . Vitamin D insufficiency 11/29/2016  . Hypogonadism in male 11/01/2016  . Type 1 diabetes mellitus ([place])   . Hypothyroidism   . Hypertension   . Hyperlipidemia   . Closed fracture of right fibula and tibia 10/29/2016  . Closed tibia fracture 10/29/2016  . Closed fracture of right tibial plafond with fibula involvement 10/29/2016  . Abnormal liver function tests 03/06/2013  . RASH AND OTHER NONSPECIFIC SKIN ERUPTION 10/07/2009  . SHOULDER  PAIN, LEFT 12/27/2008  . Type I (juvenile type) diabetes mellitus without mention of complication, uncontrolled 12/03/2007  . HYPOTHYROIDISM 11/12/2007  . HYPERCHOLESTEROLEMIA 11/12/2007  . MULTIPLE SCLEROSIS 08/29/2007  . PROLIFERATIVE DIABETIC RETINOPATHY 08/29/2007  . HYPERTENSION 12/25/2006    Bjorn Loser, PTA  02/13/17, 11:38 AM [facility] [address] [place], [place], [address] Phone: [phone]   Fax:  [phone]  Name: Johnathan Ross MRN: [id number] Date of Birth: [birth year]

## 2017-02-15 ENCOUNTER — Ambulatory Visit: Payer: Medicare Other | Admitting: Physical Therapy

## 2017-02-15 ENCOUNTER — Encounter: Payer: Self-pay | Admitting: Physical Therapy

## 2017-02-15 DIAGNOSIS — M25671 Stiffness of right ankle, not elsewhere classified: Secondary | ICD-10-CM

## 2017-02-15 DIAGNOSIS — R262 Difficulty in walking, not elsewhere classified: Secondary | ICD-10-CM

## 2017-02-15 DIAGNOSIS — M25571 Pain in right ankle and joints of right foot: Secondary | ICD-10-CM | POA: Diagnosis not present

## 2017-02-15 DIAGNOSIS — R600 Localized edema: Secondary | ICD-10-CM | POA: Diagnosis not present

## 2017-02-15 DIAGNOSIS — M6281 Muscle weakness (generalized): Secondary | ICD-10-CM | POA: Diagnosis not present

## 2017-02-15 DIAGNOSIS — R2681 Unsteadiness on feet: Secondary | ICD-10-CM | POA: Diagnosis not present

## 2017-02-16 NOTE — Therapy (Signed)
[facility] [address] [place], [place], [address] Phone: [phone]   Fax:  [phone]  Physical Therapy Treatment  Patient Details  Name: Johnathan Ross MRN: [id number] Date of Birth: [birth year] Referring Provider: Altamese , MD  Encounter Date: 02/15/2017   02/15/17 0935  PT Visits / Re-Eval  Visit Number 12 (G-code due visit number 18)  Number of Visits 16  Date for PT Re-Evaluation 02/24/17  Authorization  Authorization Type Kx modifier at visit 15, G-codes every 10th visit  PT Time Calculation  PT Start Time 0932  PT Stop Time 1015  PT Time Calculation (min) 43 min  PT - End of Session  Activity Tolerance Patient tolerated treatment well  Behavior During Therapy [facility] for tasks assessed/performed     Past Medical History:  Diagnosis Date  . Hyperlipidemia   . Hypertension   . Hypogonadism in male 11/01/2016  . Hypothyroidism   . Multiple sclerosis ([place])   . 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 Type 1 diabetes mellitus ([place]) dx'd 1994  . Vitamin D insufficiency 11/29/2016    Past Surgical History:  Procedure Laterality Date  . EYE SURGERY Bilateral    "laser for diabetic retinopathy"  . FRACTURE SURGERY    . OPEN REDUCTION INTERNAL FIXATION (ORIF) TIBIA/FIBULA FRACTURE Right 10/30/2016  . ORIF ANKLE FRACTURE Left 2015  . ORIF TIBIA FRACTURE Right 10/30/2016   Procedure: OPEN REDUCTION INTERNAL FIXATION (ORIF) TIBIA FIBULA FRACTURE;  Surgeon: Altamese , MD;  Location: [facility];  Service: Orthopedics;  Laterality: Right;  . RETINAL LASER PROCEDURE Bilateral    "for diabetic retinopathy"    There were no vitals filed for this visit.     02/15/17 0935  Symptoms/Limitations  Subjective No new complaints. No falls or pain to report.  Patient is accompained by: Family member  Pertinent History MS dx about 6 years ago  How long can you sit comfortably? unlimited  How long can you  stand comfortably? unlimited  How long can you walk comfortably? household distances due to LE fatigue/weakness  Diagnostic tests X-ray  Patient Stated Goals Walk without AD  Pain Assessment  Currently in Pain? No/denies      02/15/17 0936  Transfers  Transfers Sit to Stand;Stand to Sit  Sit to Stand 4: Min assist;With upper extremity assist;From bed;From chair/3-in-1  Stand to Sit 4: Min guard;With upper extremity assist;To bed;To chair/3-in-1  Ambulation/Gait  Ambulation/Gait Yes  Ambulation/Gait Assistance 4: Min assist  Ambulation/Gait Assistance Details pt used RW to enter/exit gym with supervision; with cane pt was notibly fatigued from ex's needed increased assist from mat to tabel (min/mod assist) then UE touch to table for lap around table, used RW to get from table to mat table due to incr in right knee genu recurvatum.   Ambulation Distance (Feet) 100 Feet (x2 with RW; 40 x1 cane)  Assistive device Straight cane;Rolling walker  Gait Pattern Step-to pattern;Decreased step length - right;Decreased step length - left;Decreased stride length;Decreased hip/knee flexion - right;Decreased dorsiflexion - right;Right genu recurvatum  Ambulation Surface Level;Indoor  Neuro Re-ed   Neuro Re-ed Details  on red mat on floor next to blue mat: tall kneeling: mini squats 2 sets of 10 reps with cues for equal LE weight bearing/midline position, then side stepping across length of mat x 2 laps each way, then fwd/bwd walking x 2 laps each way across length of mat. min guard to min assist for balance with occasional touch to blue mat for  balance; half kneeling: more assist for balance needed with left foot forward (mod assist) vs with right foot forward (min guard assit). worked on posture and good form, progressing to alternating UE raises x 10 each side. UE support needed when left foot was forward. quadruped: alternating leg outs for full hip extension x 5 each side, then alternating contralateral  UE/leg raises x 5 each side with min assist for balance and stability at pelvis.            PT Short Term Goals - 01/22/17 1315      PT SHORT TERM GOAL #1   Title pt will be independent in his initial HEP   Baseline independent 01/22/2017   Time 3   Period Weeks   Status Achieved     PT SHORT TERM GOAL #2   Title pt will be able to amb with a RW 250 feet with step through gait pattern safely.    Baseline toe catches with gait in clinic,  walker keeps him safe   Time 3   Period Weeks   Status On-going           PT Long Term Goals - 01/30/17 2132      PT LONG TERM GOAL #1   Title Pt will be able to amb > 500 feet with no assistive device and supervision on level surfaces with step through gait pattern.    Time 8   Period Weeks   Status Revised   Target Date 02/18/17     PT LONG TERM GOAL #2   Title Pt will improve his R dorsiflexion and plantar flexion to >/= 10 degrees   Baseline 4 degrees passively on 12/24/16   Time 8   Period Weeks   Status Revised   Target Date 02/18/17     PT LONG TERM GOAL #3   Title Pt will improve his bilateral hip strength to >/= 4+/5 in order to improve gait and functional mobility.    Time 8   Period Weeks   Status Revised   Target Date 02/18/17     PT LONG TERM GOAL #4   Title Pt will be able to peform sit to stand with no assistive device and will perform five times sit to stand in <15 seconds to indicate improved LE strength and balance during changes in position   Baseline 17.1 seconds with use of UE   Time 8   Period Weeks   Status Revised   Target Date 02/18/17     PT LONG TERM GOAL #5   Title Pt will decrease falls risk as indicated by increase in BERG score to > or = 40/56   Baseline 35/56   Time 8   Period Weeks   Status Revised   Target Date 02/18/17     Additional Long Term Goals   Additional Long Term Goals Yes     PT LONG TERM GOAL #6   Title Pt will demonstrate ability to perform standing balance and  strengthening HEP safely with supervision   Time 8   Period Weeks   Status New   Target Date 02/18/17     PT LONG TERM GOAL #7   Title gait velocity goal        02/15/17 0936  Plan  Clinical Impression Statement Today's skilled session focused on core/LE strengthening initially, then gait with straight cane. Pt very fatigued when working on gait, needing increased assistance for balance with significant genu recurvatum noted. Pt  does report having a brace for "foot drop". Asked pt to bring brace to next visit to see if it can help with foot drop and knee control. Pt is progressing toward goals and should benefit from continued PT to progress toward unmet goals.   Pt will benefit from skilled therapeutic intervention in order to improve on the following deficits Abnormal gait;Pain;Decreased range of motion;Decreased balance;Decreased mobility;Difficulty walking;Decreased activity tolerance;Decreased strength  Rehab Potential Good  Clinical Impairments Affecting Rehab Potential MS   PT Frequency 2x / week  PT Duration 8 weeks  PT Treatment/Interventions ADLs/Self Care Home Management;Cryotherapy;Vasopneumatic Device;Taping;Gait training;Stair training;Functional mobility training;Therapeutic activities;Therapeutic exercise;Balance training;Neuromuscular re-education;Passive range of motion;Manual techniques;Patient/family education;Electrical Stimulation  PT Next Visit Plan assess gait with pt's AFO; continue to work on LE strengthening and balance; Endurance training- monitor HR and fatigue level  PT Home Exercise Plan Continue with ankle HEP with addition of balance exercises  Consulted and Agree with Plan of Care Patient          Patient will benefit from skilled therapeutic intervention in order to improve the following deficits and impairments:  Abnormal gait, Pain, Decreased range of motion, Decreased balance, Decreased mobility, Difficulty walking, Decreased activity tolerance,  Decreased strength  Visit Diagnosis: Difficulty in walking, not elsewhere classified  Stiffness of right ankle, not elsewhere classified     Problem List Patient Active Problem List   Diagnosis Date Noted  . Vitamin D insufficiency 11/29/2016  . Hypogonadism in male 11/01/2016  . Type 1 diabetes mellitus ([place])   . Hypothyroidism   . Hypertension   . Hyperlipidemia   . Closed fracture of right fibula and tibia 10/29/2016  . Closed tibia fracture 10/29/2016  . Closed fracture of right tibial plafond with fibula involvement 10/29/2016  . Abnormal liver function tests 03/06/2013  . RASH AND OTHER NONSPECIFIC SKIN ERUPTION 10/07/2009  . SHOULDER PAIN, LEFT 12/27/2008  . Type I (juvenile type) diabetes mellitus without mention of complication, uncontrolled 12/03/2007  . HYPOTHYROIDISM 11/12/2007  . HYPERCHOLESTEROLEMIA 11/12/2007  . MULTIPLE SCLEROSIS 08/29/2007  . PROLIFERATIVE DIABETIC RETINOPATHY 08/29/2007  . HYPERTENSION 12/25/2006    Willow Ora, PTA, [facility] [address] [place], [place] [address] [phone] 02/16/17, 10:04 PM   Name: Johnathan Ross MRN: [id number] Date of Birth: 23-Feb-1971

## 2017-02-20 ENCOUNTER — Ambulatory Visit: Payer: Medicare Other | Admitting: Physical Therapy

## 2017-02-20 ENCOUNTER — Encounter: Payer: Self-pay | Admitting: Physical Therapy

## 2017-02-20 DIAGNOSIS — M25571 Pain in right ankle and joints of right foot: Secondary | ICD-10-CM | POA: Diagnosis not present

## 2017-02-20 DIAGNOSIS — R600 Localized edema: Secondary | ICD-10-CM | POA: Diagnosis not present

## 2017-02-20 DIAGNOSIS — M6281 Muscle weakness (generalized): Secondary | ICD-10-CM | POA: Diagnosis not present

## 2017-02-20 DIAGNOSIS — R262 Difficulty in walking, not elsewhere classified: Secondary | ICD-10-CM

## 2017-02-20 DIAGNOSIS — M25671 Stiffness of right ankle, not elsewhere classified: Secondary | ICD-10-CM | POA: Diagnosis not present

## 2017-02-20 DIAGNOSIS — R2681 Unsteadiness on feet: Secondary | ICD-10-CM | POA: Diagnosis not present

## 2017-02-21 NOTE — Therapy (Signed)
[facility] [address] [place], [place], [address] Phone: [phone]   Fax:  [phone]  Physical Therapy Treatment  Patient Details  Name: Johnathan Ross MRN: [id number] Date of Birth: 11-29-1970 Referring Provider: Altamese Deming, MD  Encounter Date: 02/20/2017      PT End of Session - 02/20/17 0852    Visit Number 13  G-code due visit number 18   Number of Visits 16   Date for PT Re-Evaluation 02/24/17   Authorization Type Kx modifier at visit 15, G-codes every 10th visit   PT Start Time 0850   PT Stop Time 0930   PT Time Calculation (min) 40 min   Equipment Utilized During Treatment Gait belt   Activity Tolerance Patient tolerated treatment well   Behavior During Therapy [facility] for tasks assessed/performed      Past Medical History:  Diagnosis Date  . Hyperlipidemia   . Hypertension   . Hypogonadism in male 11/01/2016  . Hypothyroidism   . Multiple sclerosis ([place])   . 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 Type 1 diabetes mellitus ([place]) dx'd 1994  . Vitamin D insufficiency 11/29/2016    Past Surgical History:  Procedure Laterality Date  . EYE SURGERY Bilateral    "laser for diabetic retinopathy"  . FRACTURE SURGERY    . OPEN REDUCTION INTERNAL FIXATION (ORIF) TIBIA/FIBULA FRACTURE Right 10/30/2016  . ORIF ANKLE FRACTURE Left 2015  . ORIF TIBIA FRACTURE Right 10/30/2016   Procedure: OPEN REDUCTION INTERNAL FIXATION (ORIF) TIBIA FIBULA FRACTURE;  Surgeon: Altamese [place], MD;  Location: [facility];  Service: Orthopedics;  Laterality: Right;  . RETINAL LASER PROCEDURE Bilateral    "for diabetic retinopathy"    There were no vitals filed for this visit.      Subjective Assessment - 02/20/17 0851    Subjective No new complaints. No falls or pain to report.   Patient is accompained by: Family member  dad   How long can you sit comfortably? unlimited   How long can you stand comfortably? unlimited    How long can you walk comfortably? household distances due to LE fatigue/weakness   Diagnostic tests X-ray   Patient Stated Goals Walk without AD   Currently in Pain? No/denies              [facility] Adult PT Treatment/Exercise - 02/20/17 0853      Transfers   Transfers Sit to Stand;Stand to Sit   Sit to Stand 4: Min assist;With upper extremity assist;From bed;From chair/3-in-1   Stand to Sit 4: Min guard;With upper extremity assist;To bed;To chair/3-in-1     Ambulation/Gait   Ambulation/Gait Yes   Ambulation/Gait Assistance 4: Min assist;4: Min guard   Ambulation/Gait Assistance Details gait with cane with min to mod assist needed and light UE support on table when not using HHA.; remainder of gait was with RW while trying braces on right LE to assist with foot drop/knee controll. Pt wore his brace in which does go up back of leg to just above his ankle for assist with foot drop only (off shelf brace he self purchased). Tried the posterior walk on [place] with control on foot drop noted and decreased recurvatum noted. Pt unsure and will need more training/use in sessions, especially as pt is fatigued.    Ambulation Distance (Feet) 110 Feet  x2 RW, around table +66ft x2 cane   Assistive device Straight cane;Rolling walker   Gait Pattern Step-to pattern;Decreased step length - right;Decreased step length -  left;Decreased stride length;Decreased hip/knee flexion - right;Decreased dorsiflexion - right;Right genu recurvatum   Ambulation Surface Level;Indoor     Knee/Hip Exercises: Supine   Bridges AROM;Strengthening;Left;10 reps;Limitations;2 sets   Bridges Limitations cues on hold time and assist needed for knee position with exercise as well.    Straight Leg Raises AROM;Strengthening;Both;1 set;10 reps;Limitations;AAROM   Straight Leg Raises Limitations increased assistance needed as reps progressed due to fatigue   Other Supine Knee/Hip Exercises in hooklying position with green band  around knees: clamshells with 5 sec holds x 10 reps with cures on form/technique; then hip falloutsx 10 reps each side with cues on slow, controlled movements. assist needed for knee stability while other leg moved.,              PT Short Term Goals - 01/22/17 1315      PT SHORT TERM GOAL #1   Title pt will be independent in his initial HEP   Baseline independent 01/22/2017   Time 3   Period Weeks   Status Achieved     PT SHORT TERM GOAL #2   Title pt will be able to amb with a RW 250 feet with step through gait pattern safely.    Baseline toe catches with gait in clinic,  walker keeps him safe   Time 3   Period Weeks   Status On-going           PT Long Term Goals - 01/30/17 2132      PT LONG TERM GOAL #1   Title Pt will be able to amb > 500 feet with no assistive device and supervision on level surfaces with step through gait pattern.    Time 8   Period Weeks   Status Revised   Target Date 02/18/17     PT LONG TERM GOAL #2   Title Pt will improve his R dorsiflexion and plantar flexion to >/= 10 degrees   Baseline 4 degrees passively on 12/24/16   Time 8   Period Weeks   Status Revised   Target Date 02/18/17     PT LONG TERM GOAL #3   Title Pt will improve his bilateral hip strength to >/= 4+/5 in order to improve gait and functional mobility.    Time 8   Period Weeks   Status Revised   Target Date 02/18/17     PT LONG TERM GOAL #4   Title Pt will be able to peform sit to stand with no assistive device and will perform five times sit to stand in <15 seconds to indicate improved LE strength and balance during changes in position   Baseline 17.1 seconds with use of UE   Time 8   Period Weeks   Status Revised   Target Date 02/18/17     PT LONG TERM GOAL #5   Title Pt will decrease falls risk as indicated by increase in BERG score to > or = 40/56   Baseline 35/56   Time 8   Period Weeks   Status Revised   Target Date 02/18/17     Additional Long Term  Goals   Additional Long Term Goals Yes     PT LONG TERM GOAL #6   Title Pt will demonstrate ability to perform standing balance and strengthening HEP safely with supervision   Time 8   Period Weeks   Status New   Target Date 02/18/17     PT LONG TERM GOAL #7   Title gait velocity  goal               Plan - 02/20/17 2952    Clinical Impression Statement Today's skilled session addressed gait and use of braces for right foot drop/knee control. Pt is unsure if any helped or not and will need further training/assessment of braces to find the best one. Remainder of session continued to address LE/core strengthening while being mindful that pt was already fatigued on arrival and most likey had an execerrbation the past 2 days after mowing the grass on riding mower Friday. (Pt reporting he could not move the right leg for 2 days and almost cancelled today during session today). Pt is making progress and should benefit from continued PT to progress toward unmet goals.                  Rehab Potential Good   Clinical Impairments Affecting Rehab Potential MS    PT Frequency 2x / week   PT Duration 8 weeks   PT Treatment/Interventions ADLs/Self Care Home Management;Cryotherapy;Vasopneumatic Device;Taping;Gait training;Stair training;Functional mobility training;Therapeutic activities;Therapeutic exercise;Balance training;Neuromuscular re-education;Passive range of motion;Manual techniques;Patient/family education;Electrical Stimulation   PT Next Visit Plan assess LTGs as plan of care ends on 02/24/17   PT Home Exercise Plan Continue with ankle HEP with addition of balance exercises   Consulted and Agree with Plan of Care Patient      Patient will benefit from skilled therapeutic intervention in order to improve the following deficits and impairments:  Abnormal gait, Pain, Decreased range of motion, Decreased balance, Decreased mobility, Difficulty walking, Decreased activity tolerance,  Decreased strength  Visit Diagnosis: Difficulty in walking, not elsewhere classified     Problem List Patient Active Problem List   Diagnosis Date Noted  . Vitamin D insufficiency 11/29/2016  . Hypogonadism in male 11/01/2016  . Type 1 diabetes mellitus ([place])   . Hypothyroidism   . Hypertension   . Hyperlipidemia   . Closed fracture of right fibula and tibia 10/29/2016  . Closed tibia fracture 10/29/2016  . Closed fracture of right tibial plafond with fibula involvement 10/29/2016  . Abnormal liver function tests 03/06/2013  . RASH AND OTHER NONSPECIFIC SKIN ERUPTION 10/07/2009  . SHOULDER PAIN, LEFT 12/27/2008  . Type I (juvenile type) diabetes mellitus without mention of complication, uncontrolled 12/03/2007  . HYPOTHYROIDISM 11/12/2007  . HYPERCHOLESTEROLEMIA 11/12/2007  . MULTIPLE SCLEROSIS 08/29/2007  . PROLIFERATIVE DIABETIC RETINOPATHY 08/29/2007  . HYPERTENSION 12/25/2006    Willow Ora, PTA, [facility] [address], [place], [place] [address] [phone] 02/21/17, 2:14 PM   Name: Johnathan Ross MRN: [id number] Date of Birth: [birth year]

## 2017-02-22 ENCOUNTER — Ambulatory Visit: Payer: Medicare Other | Admitting: Physical Therapy

## 2017-02-22 ENCOUNTER — Encounter: Payer: Self-pay | Admitting: Physical Therapy

## 2017-02-22 DIAGNOSIS — R2681 Unsteadiness on feet: Secondary | ICD-10-CM | POA: Diagnosis not present

## 2017-02-22 DIAGNOSIS — R600 Localized edema: Secondary | ICD-10-CM | POA: Diagnosis not present

## 2017-02-22 DIAGNOSIS — M25671 Stiffness of right ankle, not elsewhere classified: Secondary | ICD-10-CM

## 2017-02-22 DIAGNOSIS — R262 Difficulty in walking, not elsewhere classified: Secondary | ICD-10-CM | POA: Diagnosis not present

## 2017-02-22 DIAGNOSIS — M6281 Muscle weakness (generalized): Secondary | ICD-10-CM | POA: Diagnosis not present

## 2017-02-22 DIAGNOSIS — M25571 Pain in right ankle and joints of right foot: Secondary | ICD-10-CM | POA: Diagnosis not present

## 2017-02-22 NOTE — Addendum Note (Signed)
Addended by: Raylene Everts F on: 02/22/2017 11:20 AM   Modules accepted: Orders

## 2017-02-22 NOTE — Therapy (Signed)
[facility] [address] [place], [place], [address] Phone: [phone]   Fax:  [phone]  Physical Therapy Treatment  Patient Details  Name: Johnathan Ross MRN: [id number] Date of Birth: 1970-11-16 Referring Provider: Altamese [place], MD  Encounter Date: 02/22/2017      PT End of Session - 02/22/17 0851    Visit Number 14  G-code due visit number 18   Number of Visits 16   Date for PT Re-Evaluation 02/24/17   Authorization Type Kx modifier at visit 15, G-codes every 10th visit   PT Start Time 0850   PT Stop Time 0930   PT Time Calculation (min) 40 min   Equipment Utilized During Treatment Gait belt   Activity Tolerance Patient tolerated treatment well   Behavior During Therapy [facility] for tasks assessed/performed      Past Medical History:  Diagnosis Date  . Hyperlipidemia   . Hypertension   . Hypogonadism in male 11/01/2016  . Hypothyroidism   . Multiple sclerosis ([place])   . 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 Type 1 diabetes mellitus ([place]) dx'd 1994  . Vitamin D insufficiency 11/29/2016    Past Surgical History:  Procedure Laterality Date  . EYE SURGERY Bilateral    "laser for diabetic retinopathy"  . FRACTURE SURGERY    . OPEN REDUCTION INTERNAL FIXATION (ORIF) TIBIA/FIBULA FRACTURE Right 10/30/2016  . ORIF ANKLE FRACTURE Left 2015  . ORIF TIBIA FRACTURE Right 10/30/2016   Procedure: OPEN REDUCTION INTERNAL FIXATION (ORIF) TIBIA FIBULA FRACTURE;  Surgeon: Altamese [place], MD;  Location: [facility];  Service: Orthopedics;  Laterality: Right;  . RETINAL LASER PROCEDURE Bilateral    "for diabetic retinopathy"    There were no vitals filed for this visit.      Subjective Assessment - 02/22/17 0851    Subjective No new complaints. No falls or pain to report.   Patient is accompained by: Family member  dad   Pertinent History MS dx about 6 years ago   How long can you sit comfortably? unlimited   How long can you stand comfortably? unlimited   How long can you walk comfortably? household distances due to LE fatigue/weakness   Diagnostic tests X-ray   Patient Stated Goals Walk without AD   Currently in Pain? No/denies            [facility] PT Assessment - 02/22/17 0854      AROM   AROM Assessment Site Ankle   Right/Left Ankle Right   Right Ankle Dorsiflexion -10   Right Ankle Plantar Flexion 26   Right Ankle Inversion 15   Right Ankle Eversion 10     Transfers   Transfers Sit to Stand;Stand to Sit   Sit to Stand 4: Min guard;With upper extremity assist;From chair/3-in-1;From bed   Stand to Sit 4: Min guard;With upper extremity assist;To bed;To chair/3-in-1     Ambulation/Gait   Ambulation/Gait Yes   Ambulation/Gait Assistance 4: Min guard;5: Supervision   Ambulation Distance (Feet) 250 Feet   Assistive device Rolling walker   Gait Pattern Step-through pattern;Decreased stride length;Decreased step length - right;Decreased step length - left;Decreased hip/knee flexion - right;Decreased hip/knee flexion - left;Poor foot clearance - right   Ambulation Surface Level;Indoor     Standardized Balance Assessment   Standardized Balance Assessment Five Times Sit to Stand;Berg Balance Test   Five times sit to stand comments  18.16 with UE use on arm rests from standard height chair     Merrilee Jansky  Balance Test   Sit to Stand Able to stand without using hands and stabilize independently   Standing Unsupported Able to stand safely 2 minutes   Sitting with Back Unsupported but Feet Supported on Floor or Stool Able to sit safely and securely 2 minutes   Stand to Sit Controls descent by using hands   Transfers Able to transfer safely, definite need of hands   Standing Unsupported with Eyes Closed Able to stand 10 seconds with supervision   Standing Ubsupported with Feet Together Able to place feet together independently and stand for 1 minute with supervision   From Standing, Reach Forward  with Outstretched Arm Can reach forward >12 cm safely (5")   From Standing Position, Pick up Object from [place] to pick up shoe, needs supervision   From Standing Position, Turn to Look Behind Over each Shoulder Looks behind one side only/other side shows less weight shift   Turn 360 Degrees Needs close supervision or verbal cueing   Standing Unsupported, Alternately Place Feet on Step/Stool Able to complete >2 steps/needs minimal assist   Standing Unsupported, One Foot in Front Able to take small step independently and hold 30 seconds   Standing on One Leg Tries to lift leg/unable to hold 3 seconds but remains standing independently   Total Score 38           [facility] Adult PT Treatment/Exercise - 02/22/17 0854      Ambulation/Gait   Gait velocity 25.94 sec's= 1.26 ft/sec with 2 standing rest breaks using RW              PT Short Term Goals - 02/22/17 1025      PT SHORT TERM GOAL #1   Title pt will be independent in his initial HEP   Baseline independent 01/22/2017   Status Achieved     PT SHORT TERM GOAL #2   Title pt will be able to amb with a RW 250 feet with step through gait pattern safely.    Baseline 02/22/17: met today   Status Achieved           PT Long Term Goals - 02/22/17 0853      PT LONG TERM GOAL #1   Title Pt will be able to amb > 500 feet with no assistive device and supervision on level surfaces with step through gait pattern.    Baseline 02/22/17: pt continues to need RW for stability, have trialed straight cane with min to mod assist needed   Time --   Period --   Status Not Met     PT LONG TERM GOAL #2   Title Pt will improve his R dorsiflexion and plantar flexion to >/= 10 degrees   Baseline 02/22/17: met for PF, not for DF   Time --   Period --   Status Partially Met     PT LONG TERM GOAL #3   Title Pt will improve his bilateral hip strength to >/= 4+/5 in order to improve gait and functional mobility.    Baseline 02/22/17: not formally  tested today, however functionally with gait appers to be 3-/5   Time --   Period --   Status Not Met     PT LONG TERM GOAL #4   Title Pt will be able to peform sit to stand with no assistive device and will perform five times sit to stand in <15 seconds to indicate improved LE strength and balance during changes in position  Baseline 02/22/17: 18.16 with UE use, slower than initial time of 17.1 sec's   Time --   Period --   Status Not Met     PT LONG TERM GOAL #5   Title Pt will decrease falls risk as indicated by increase in BERG score to > or = 40/56   Baseline 02/22/17: 38/56, improved just not to goal   Time --   Period --   Status Partially Met     PT LONG TERM GOAL #6   Title Pt will demonstrate ability to perform standing balance and strengthening HEP safely with supervision   Baseline 02/22/17: cues/assist needed with current HEP    Time --   Period --   Status On-going     PT LONG TERM GOAL #7   Title gait velocity goal   Baseline 02/22/17: 1.26 ft/sec with RW with 2 standing rest breaks for ~5-6 sec's each   Status On-going            Plan - 02/22/17 9093    Clinical Impression Statement Today's skilled session addressed progress toward LTGs (and remaining STG that was never addressed) with plan to renew. Pt met the remaining STG, however demo'd limited progress toward LTGs. Primary PT to renew.    Rehab Potential Good   Clinical Impairments Affecting Rehab Potential MS    PT Frequency 2x / week   PT Duration 8 weeks   PT Treatment/Interventions ADLs/Self Care Home Management;Cryotherapy;Vasopneumatic Device;Taping;Gait training;Stair training;Functional mobility training;Therapeutic activities;Therapeutic exercise;Balance training;Neuromuscular re-education;Passive range of motion;Manual techniques;Patient/family education;Electrical Stimulation   PT Next Visit Plan continue to address gait, ? bracing for DF/knee control, LE strenghening- monitor HR and fatigue  levels   PT Home Exercise Plan Continue with ankle HEP with addition of balance exercises   Consulted and Agree with Plan of Care Patient      Patient will benefit from skilled therapeutic intervention in order to improve the following deficits and impairments:  Abnormal gait, Pain, Decreased range of motion, Decreased balance, Decreased mobility, Difficulty walking, Decreased activity tolerance, Decreased strength  Visit Diagnosis: Difficulty in walking, not elsewhere classified  Stiffness of right ankle, not elsewhere classified     Problem List Patient Active Problem List   Diagnosis Date Noted  . Vitamin D insufficiency 11/29/2016  . Hypogonadism in male 11/01/2016  . Type 1 diabetes mellitus ([place])   . Hypothyroidism   . Hypertension   . Hyperlipidemia   . Closed fracture of right fibula and tibia 10/29/2016  . Closed tibia fracture 10/29/2016  . Closed fracture of right tibial plafond with fibula involvement 10/29/2016  . Abnormal liver function tests 03/06/2013  . RASH AND OTHER NONSPECIFIC SKIN ERUPTION 10/07/2009  . SHOULDER PAIN, LEFT 12/27/2008  . Type I (juvenile type) diabetes mellitus without mention of complication, uncontrolled 12/03/2007  . HYPOTHYROIDISM 11/12/2007  . HYPERCHOLESTEROLEMIA 11/12/2007  . MULTIPLE SCLEROSIS 08/29/2007  . PROLIFERATIVE DIABETIC RETINOPATHY 08/29/2007  . HYPERTENSION 12/25/2006    Willow Ora, PTA, [facility] [address] [place], [place] [address] [phone] 02/22/17, 10:36 AM   Name: Johnathan Ross MRN: [id number] Date of Birth: [birth year]

## 2017-02-25 ENCOUNTER — Encounter: Payer: Self-pay | Admitting: Physical Therapy

## 2017-02-25 ENCOUNTER — Other Ambulatory Visit: Payer: Self-pay | Admitting: Endocrinology

## 2017-02-25 ENCOUNTER — Ambulatory Visit: Payer: Medicare Other | Attending: Orthopedic Surgery | Admitting: Physical Therapy

## 2017-02-25 DIAGNOSIS — M25671 Stiffness of right ankle, not elsewhere classified: Secondary | ICD-10-CM | POA: Insufficient documentation

## 2017-02-25 DIAGNOSIS — R2681 Unsteadiness on feet: Secondary | ICD-10-CM | POA: Diagnosis not present

## 2017-02-25 DIAGNOSIS — M6281 Muscle weakness (generalized): Secondary | ICD-10-CM | POA: Diagnosis not present

## 2017-02-25 DIAGNOSIS — R262 Difficulty in walking, not elsewhere classified: Secondary | ICD-10-CM | POA: Diagnosis not present

## 2017-02-25 NOTE — Patient Instructions (Signed)
HIP / KNEE: Flexion, Heel Slides - Supine    With red theraband around ankles; Slide right heel up toward buttocks, keeping leg in straight line.  Lower and repeat with left leg.  _10__ reps per set, _2__ sets per day, __5_ days per week  Bracing With Bridging (Hook-Lying)    Push through both heels and lift bottom.  Hold 2-3 seconds. Repeat _10__ times. Do _2__ times a day.   Bracing With March in Bridging (Hook-Lying)    Press through both heels. Lift bottom and hold, then march in place with each leg and then lower hips back down. March _10__ times. Do _2__ times a day.  (Home) Knee: Flexion / Hamstring Curl - Prone    Lie with strap around both ankles. Pull foot toward buttocks with right leg, lower and then with left leg. Repeat __10__ times per set. Do _2__ sets per day.

## 2017-02-25 NOTE — Therapy (Signed)
[employer] [address] [place], [place], [address] Phone: [phone]   Fax:  [phone]  Physical Therapy Treatment  Patient Details  Name: Johnathan Ross MRN: [id number] Date of Birth: [birth year] Referring Provider: Altamese [place], MD  Encounter Date: 02/25/2017      PT End of Session - 02/25/17 1132    Visit Number 15  G-code due visit number 18   Number of Visits 30  per recertification   Date for PT Re-Evaluation 04/23/17   Authorization Type Kx modifier at visit 15, G-codes and PN every 10th visit   PT Start Time 0925   PT Stop Time 1015   PT Time Calculation (min) 50 min   Activity Tolerance Patient tolerated treatment well   Behavior During Therapy [facility] for tasks assessed/performed      Past Medical History:  Diagnosis Date  . Hyperlipidemia   . Hypertension   . Hypogonadism in male 11/01/2016  . Hypothyroidism   . Multiple sclerosis ([place])   . 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 Type 1 diabetes mellitus ([place]) dx'd 1994  . Vitamin D insufficiency 11/29/2016    Past Surgical History:  Procedure Laterality Date  . EYE SURGERY Bilateral    "laser for diabetic retinopathy"  . FRACTURE SURGERY    . OPEN REDUCTION INTERNAL FIXATION (ORIF) TIBIA/FIBULA FRACTURE Right 10/30/2016  . ORIF ANKLE FRACTURE Left 2015  . ORIF TIBIA FRACTURE Right 10/30/2016   Procedure: OPEN REDUCTION INTERNAL FIXATION (ORIF) TIBIA FIBULA FRACTURE;  Surgeon: Altamese [place], MD;  Location: [facility];  Service: Orthopedics;  Laterality: Right;  . RETINAL LASER PROCEDURE Bilateral    "for diabetic retinopathy"    There were no vitals filed for this visit.      Subjective Assessment - 02/25/17 0928    Subjective No issues over the weekend, no falls per pt report.  Pt noted to be dragging his R foot more today, required multiple standing rest breaks.  Pt denies pain but reports increased fatigue.   Patient is accompained  by: Family member   Pertinent History MS dx about 6 years ago   How long can you sit comfortably? unlimited   How long can you stand comfortably? unlimited   How long can you walk comfortably? household distances due to LE fatigue/weakness   Diagnostic tests X-ray   Patient Stated Goals Walk without AD   Currently in Pain? No/denies                         [facility] Adult PT Treatment/Exercise - 02/25/17 0930      Ambulation/Gait   Ambulation/Gait Yes   Ambulation/Gait Assistance 4: Min guard   Ambulation/Gait Assistance Details with RW and [place] posterior walkon trial AFO.  Pt able to initiate 3-4 steps with RLE with sufficient knee and hip flexion to clear R foot but by 5th step pt demonstrates R foot drag and requires standing rest break   Ambulation Distance (Feet) 115 Feet   Assistive device Rolling walker   Gait Pattern Step-through pattern;Decreased stride length;Decreased step length - right;Decreased step length - left;Decreased hip/knee flexion - right;Decreased hip/knee flexion - left;Poor foot clearance - right   Ambulation Surface Level;Indoor     Knee/Hip Exercises: Supine   Heel Slides Strengthening;Right;Left;1 set;10 reps   Heel Slides Limitations alternating with red theraband for resistance   Bridges AROM;Strengthening;Both;1 set;10 reps   Single Leg Bridge Strengthening;Right;Left;1 set;10 reps  alternating marches with bridge  Knee/Hip Exercises: Prone   Hamstring Curl 1 set;10 reps   Hamstring Curl Limitations Reciprocal R and L curl against resistance of red theraband; AAROM for RLE             Balance Exercises - 02/25/17 1015      Balance Exercises: Standing   SLS Eyes open;Upper extremity support 1;Upper extremity support 2;2 reps;10 secs  standing on rocker board ant/post   Rockerboard Anterior/posterior;EO;10 reps;UE support           PT Education - 02/25/17 1132    Education provided Yes   Education Details hamstring  strengthening for HEP   Person(s) Educated Patient;Parent(s)   Methods Explanation;Handout   Comprehension Verbalized understanding;Returned demonstration          PT Short Term Goals - 02/22/17 1104      PT SHORT TERM GOAL #1   Title Pt will perform standing HEP with supervision of family   Time 4   Period Weeks   Status Revised   Target Date 03/24/17     PT SHORT TERM GOAL #2   Title Pt will ambulate 250' on level surfaces with cane with supervision   Time 4   Period Weeks   Status Revised   Target Date 03/24/17     PT SHORT TERM GOAL #3   Title Pt will improve R ankle DF ROM to neutral (0) and increase PF by 10 deg   Baseline -10 DF, 26 PF   Time 4   Period Weeks   Status New   Target Date 03/24/17     PT SHORT TERM GOAL #4   Title Pt will demonstrate improved LE strength as indicated by decrease in five time sit to stand < or = 15 seconds   Baseline 18.16 with UE support on arm chair   Time 4   Period Weeks   Status New   Target Date 03/24/17     PT SHORT TERM GOAL #5   Title Pt will improve standing balance as indicated by increase in BERG balance score to > or = 42/56   Baseline 38/56   Time 4   Period Weeks   Status New   Target Date 03/24/17     Additional Short Term Goals   Additional Short Term Goals Yes     PT SHORT TERM GOAL #6   Title Pt will improve safety with gait as indicated by increase in gait velocity to >1.5 ft/sec with LRAD   Baseline 1.26 ft/sec with RW   Time 4   Period Weeks   Status New   Target Date 03/24/17           PT Long Term Goals - 02/22/17 1109      PT LONG TERM GOAL #1   Title Pt will ambulate 300' on a variety of level indoor and outdoor paved surfaces with LRAD and supervision   Baseline 02/22/17: pt continues to need RW for stability, have trialed straight cane with min to mod assist needed   Time 8   Period Weeks   Status Revised   Target Date 04/23/17     PT LONG TERM GOAL #2   Title Pt will improve R  ankle ROM to 5-10 deg ankle DF and >40 deg PF   Baseline reassess at 4 weeks   Time 8   Period Weeks   Status Revised   Target Date 04/23/17     PT LONG TERM GOAL #3   Title  Pt will improve LE strength as indicated by improvement in five time sit to stand to < or = 12 seconds   Time 8   Period Weeks   Status Revised   Target Date 04/23/17     PT LONG TERM GOAL #5   Title Pt will decrease falls risk as indicated by increase in BERG score to > or = 48/56   Time 8   Period Weeks   Status Revised   Target Date 04/23/17     PT LONG TERM GOAL #6   Title Pt will demonstrate ability to perform standing balance and strengthening HEP safely MOD I   Time 8   Period Weeks   Status Revised   Target Date 04/23/17     PT LONG TERM GOAL #7   Title Pt will improve gait velocity to > or = 1.8 ft/sec to indicate decreased risk for falls during gait in community   Baseline 02/22/17: 1.26 ft/sec with RW with 2 standing rest breaks for ~5-6 sec's each   Time 8   Period Weeks   Status Revised   Target Date 04/23/17               Plan - 02/25/17 1132    Clinical Impression Statement Treatment session today focused on continued gait and orthotic assessment with [place] PLS on level surfaces.  Pt continues to present with limited endurance for full step length and foot clearance on R due to hip flexor and hamstring weakness.  Rest of session focused on isolated hamstring and hip flexor activation and strengthening exercises for HEP.  Also performed ankle and hip balance strategy training on rockerboard and single limb balance training on rockerboard.  Pt tolerated well with improved R ankle ROM at end of session but LE fatigue.   Rehab Potential Good   Clinical Impairments Affecting Rehab Potential MS    PT Frequency 2x / week   PT Duration 8 weeks   PT Treatment/Interventions ADLs/Self Care Home Management;Cryotherapy;Vasopneumatic Device;Taping;Gait training;Stair training;Functional  mobility training;Therapeutic activities;Therapeutic exercise;Balance training;Neuromuscular re-education;Passive range of motion;Manual techniques;Patient/family education;Electrical Stimulation;DME Instruction;Orthotic Fit/Training;Energy conservation   PT Next Visit Plan continue to address gait, ? bracing for DF/knee control, LE strenghening especially hamstring and hip flexor, stairs, rocker board for ankle ROM and strength- monitor HR and fatigue levels   PT Home Exercise Plan Continue with ankle HEP with addition of balance exercises   Consulted and Agree with Plan of Care Patient      Patient will benefit from skilled therapeutic intervention in order to improve the following deficits and impairments:  Abnormal gait, Pain, Decreased range of motion, Decreased balance, Decreased mobility, Difficulty walking, Decreased activity tolerance, Decreased strength  Visit Diagnosis: Difficulty in walking, not elsewhere classified  Stiffness of right ankle, not elsewhere classified  Unsteadiness on feet  Muscle weakness (generalized)     Problem List Patient Active Problem List   Diagnosis Date Noted  . Vitamin D insufficiency 11/29/2016  . Hypogonadism in male 11/01/2016  . Type 1 diabetes mellitus ([place])   . Hypothyroidism   . Hypertension   . Hyperlipidemia   . Closed fracture of right fibula and tibia 10/29/2016  . Closed tibia fracture 10/29/2016  . Closed fracture of right tibial plafond with fibula involvement 10/29/2016  . Abnormal liver function tests 03/06/2013  . RASH AND OTHER NONSPECIFIC SKIN ERUPTION 10/07/2009  . SHOULDER PAIN, LEFT 12/27/2008  . Type I (juvenile type) diabetes mellitus without mention of complication, uncontrolled 12/03/2007  .  HYPOTHYROIDISM 11/12/2007  . HYPERCHOLESTEROLEMIA 11/12/2007  . MULTIPLE SCLEROSIS 08/29/2007  . PROLIFERATIVE DIABETIC RETINOPATHY 08/29/2007  . HYPERTENSION 12/25/2006    Raylene Everts, PT, DPT 02/25/17    11:37  AM    [facility] [address] [place], [place], [address] Phone: [phone]   Fax:  [phone]  Name: MEARL OLVER MRN: [id number] Date of Birth: 06-Jul-1970

## 2017-02-27 ENCOUNTER — Encounter: Payer: Self-pay | Admitting: Physical Therapy

## 2017-02-27 ENCOUNTER — Ambulatory Visit: Payer: Medicare Other | Admitting: Physical Therapy

## 2017-02-27 DIAGNOSIS — R262 Difficulty in walking, not elsewhere classified: Secondary | ICD-10-CM | POA: Diagnosis not present

## 2017-02-27 DIAGNOSIS — R2681 Unsteadiness on feet: Secondary | ICD-10-CM | POA: Diagnosis not present

## 2017-02-27 DIAGNOSIS — M6281 Muscle weakness (generalized): Secondary | ICD-10-CM | POA: Diagnosis not present

## 2017-02-27 DIAGNOSIS — M25671 Stiffness of right ankle, not elsewhere classified: Secondary | ICD-10-CM

## 2017-02-27 NOTE — Therapy (Signed)
[facility] [address] [place], [place], [address] Phone: [phone]   Fax:  [phone]  Physical Therapy Treatment  Patient Details  Name: Johnathan Ross MRN: [id number] Date of Birth: [birth year] Referring Provider: Altamese [place], MD  Encounter Date: 02/27/2017      PT End of Session - 02/27/17 0936    Visit Number 16  G-code due visit number 18   Number of Visits 30  per recertification   Date for PT Re-Evaluation 04/23/17   Authorization Type Kx modifier at visit 15, G-codes and PN every 10th visit   PT Start Time 0933   PT Stop Time 1015   PT Time Calculation (min) 42 min   Activity Tolerance Patient tolerated treatment well   Behavior During Therapy [facility] for tasks assessed/performed      Past Medical History:  Diagnosis Date  . Hyperlipidemia   . Hypertension   . Hypogonadism in male 11/01/2016  . Hypothyroidism   . Multiple sclerosis ([place])   . 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 Type 1 diabetes mellitus ([place]) dx'd 1994  . Vitamin D insufficiency 11/29/2016    Past Surgical History:  Procedure Laterality Date  . EYE SURGERY Bilateral    "laser for diabetic retinopathy"  . FRACTURE SURGERY    . OPEN REDUCTION INTERNAL FIXATION (ORIF) TIBIA/FIBULA FRACTURE Right 10/30/2016  . ORIF ANKLE FRACTURE Left 2015  . ORIF TIBIA FRACTURE Right 10/30/2016   Procedure: OPEN REDUCTION INTERNAL FIXATION (ORIF) TIBIA FIBULA FRACTURE;  Surgeon: Altamese [place], MD;  Location: [facility];  Service: Orthopedics;  Laterality: Right;  . RETINAL LASER PROCEDURE Bilateral    "for diabetic retinopathy"    There were no vitals filed for this visit.      Subjective Assessment - 02/27/17 0935    Subjective No new complaints. Reports the exercises from last session were easier at home than they were here. No falls or pain to report.    Patient is accompained by: Family member  mom   Pertinent History MS dx about 6  years ago   How long can you sit comfortably? unlimited   How long can you stand comfortably? unlimited   How long can you walk comfortably? household distances due to LE fatigue/weakness   Diagnostic tests X-ray   Patient Stated Goals Walk without AD   Currently in Pain? No/denies             [facility] Adult PT Treatment/Exercise - 02/27/17 3329      Neuro Re-ed    Neuro Re-ed Details  on small balance board in ant/post direction with no UE support: rocking board with emphasis on tall posutre with EO; holding board steady: EC no head movements for 30 sec's x 2 reps. min guard to min assist for balance.       Knee/Hip Exercises: Seated   Stool Scoot - Round Trips seated on square stool: with one foot planted on floor, other foot extended out propped on PTA. with planted foot had pt pull himself fwd for knee flexion, then push himself back for knee extension x 10 reps each side. pt had one hone on mat table and other on side/edge of stool to assist with balance/stability.      Knee/Hip Exercises: Supine   Bridges AROM;Strengthening;Both;10 reps;Limitations   Bridges Limitations legs over red pball and arms across chest, 5 sec holds each rep   Straight Leg Raises AROM;Strengthening;Both;1 set;10 reps;Limitations   Straight Leg Raises Limitations with feet  on red pball: bridge up, then alteranting leg raises off ball, lowering down from bridge, repeated x 10 reps with assist needed to stabilize ball., arms at sides.     Knee/Hip Exercises: Prone   Hamstring Curl 1 set;10 reps;Limitations   Hamstring Curl Limitations with red band resistance, cues for slow, controlled movements   Hip Extension AROM;Strengthening;Both;1 set;10 reps;Limitations   Hip Extension Limitations glut kick backs with knee flexion 3 sec holds each with assist to stabilize foot while bent    Straight Leg Raises AROM;Strengthening;Both;1 set;10 reps;Limitations   Straight Leg Raises Limitations assist to stabilize  pelvis with 3 sec holds each rep,               PT Short Term Goals - 02/22/17 1104      PT SHORT TERM GOAL #1   Title Pt will perform standing HEP with supervision of family   Time 4   Period Weeks   Status Revised   Target Date 03/24/17     PT SHORT TERM GOAL #2   Title Pt will ambulate 250' on level surfaces with cane with supervision   Time 4   Period Weeks   Status Revised   Target Date 03/24/17     PT SHORT TERM GOAL #3   Title Pt will improve R ankle DF ROM to neutral (0) and increase PF by 10 deg   Baseline -10 DF, 26 PF   Time 4   Period Weeks   Status New   Target Date 03/24/17     PT SHORT TERM GOAL #4   Title Pt will demonstrate improved LE strength as indicated by decrease in five time sit to stand < or = 15 seconds   Baseline 18.16 with UE support on arm chair   Time 4   Period Weeks   Status New   Target Date 03/24/17     PT SHORT TERM GOAL #5   Title Pt will improve standing balance as indicated by increase in BERG balance score to > or = 42/56   Baseline 38/56   Time 4   Period Weeks   Status New   Target Date 03/24/17     Additional Short Term Goals   Additional Short Term Goals Yes     PT SHORT TERM GOAL #6   Title Pt will improve safety with gait as indicated by increase in gait velocity to >1.5 ft/sec with LRAD   Baseline 1.26 ft/sec with RW   Time 4   Period Weeks   Status New   Target Date 03/24/17           PT Long Term Goals - 02/22/17 1109      PT LONG TERM GOAL #1   Title Pt will ambulate 300' on a variety of level indoor and outdoor paved surfaces with LRAD and supervision   Baseline 02/22/17: pt continues to need RW for stability, have trialed straight cane with min to mod assist needed   Time 8   Period Weeks   Status Revised   Target Date 04/23/17     PT LONG TERM GOAL #2   Title Pt will improve R ankle ROM to 5-10 deg ankle DF and >40 deg PF   Baseline reassess at 4 weeks   Time 8   Period Weeks   Status  Revised   Target Date 04/23/17     PT LONG TERM GOAL #3   Title Pt will improve LE strength as indicated  by improvement in five time sit to stand to < or = 12 seconds   Time 8   Period Weeks   Status Revised   Target Date 04/23/17     PT LONG TERM GOAL #5   Title Pt will decrease falls risk as indicated by increase in BERG score to > or = 48/56   Time 8   Period Weeks   Status Revised   Target Date 04/23/17     PT LONG TERM GOAL #6   Title Pt will demonstrate ability to perform standing balance and strengthening HEP safely MOD I   Time 8   Period Weeks   Status Revised   Target Date 04/23/17     PT LONG TERM GOAL #7   Title Pt will improve gait velocity to > or = 1.8 ft/sec to indicate decreased risk for falls during gait in community   Baseline 02/22/17: 1.26 ft/sec with RW with 2 standing rest breaks for ~5-6 sec's each   Time 8   Period Weeks   Status Revised   Target Date 04/23/17           Plan - 02/27/17 5027    Clinical Impression Statement Today's skilled session focused on LE/core strengthening and some standing balance. Pt is progressing toward goals and should benefit from continued PT to progress toward unmet goals   Rehab Potential Good   Clinical Impairments Affecting Rehab Potential MS    PT Frequency 2x / week   PT Duration 8 weeks   PT Treatment/Interventions ADLs/Self Care Home Management;Cryotherapy;Vasopneumatic Device;Taping;Gait training;Stair training;Functional mobility training;Therapeutic activities;Therapeutic exercise;Balance training;Neuromuscular re-education;Passive range of motion;Manual techniques;Patient/family education;Electrical Stimulation;DME Instruction;Orthotic Fit/Training;Energy conservation   PT Next Visit Plan continue to address gait, ? bracing for DF/knee control, LE strenghening especially hamstring and hip flexor, stairs, rocker board for ankle ROM and strength- monitor HR and fatigue levels   PT Home Exercise Plan Continue  with ankle HEP with addition of balance exercises   Consulted and Agree with Plan of Care Patient      Patient will benefit from skilled therapeutic intervention in order to improve the following deficits and impairments:  Abnormal gait, Pain, Decreased range of motion, Decreased balance, Decreased mobility, Difficulty walking, Decreased activity tolerance, Decreased strength  Visit Diagnosis: Difficulty in walking, not elsewhere classified  Stiffness of right ankle, not elsewhere classified  Muscle weakness (generalized)     Problem List Patient Active Problem List   Diagnosis Date Noted  . Vitamin D insufficiency 11/29/2016  . Hypogonadism in male 11/01/2016  . Type 1 diabetes mellitus ([place])   . Hypothyroidism   . Hypertension   . Hyperlipidemia   . Closed fracture of right fibula and tibia 10/29/2016  . Closed tibia fracture 10/29/2016  . Closed fracture of right tibial plafond with fibula involvement 10/29/2016  . Abnormal liver function tests 03/06/2013  . RASH AND OTHER NONSPECIFIC SKIN ERUPTION 10/07/2009  . SHOULDER PAIN, LEFT 12/27/2008  . Type I (juvenile type) diabetes mellitus without mention of complication, uncontrolled 12/03/2007  . HYPOTHYROIDISM 11/12/2007  . HYPERCHOLESTEROLEMIA 11/12/2007  . MULTIPLE SCLEROSIS 08/29/2007  . PROLIFERATIVE DIABETIC RETINOPATHY 08/29/2007  . HYPERTENSION 12/25/2006    Willow Ora, PTA, [facility] [address] [place], [place] [address] [phone] 02/27/17, 9:21 PM   Name: Johnathan Ross MRN: [id number] Date of Birth: Jun 29, 1970

## 2017-03-03 ENCOUNTER — Other Ambulatory Visit: Payer: Self-pay | Admitting: Endocrinology

## 2017-03-04 ENCOUNTER — Telehealth: Payer: Self-pay | Admitting: Endocrinology

## 2017-03-04 ENCOUNTER — Other Ambulatory Visit: Payer: Self-pay

## 2017-03-04 MED ORDER — INSULIN ASPART 100 UNIT/ML ~~LOC~~ SOLN: SUBCUTANEOUS | 2 refills | Status: DC

## 2017-03-04 NOTE — Telephone Encounter (Signed)
He is using this with his [employer] pump

## 2017-03-04 NOTE — Telephone Encounter (Signed)
I have sent in this refill for patient.

## 2017-03-04 NOTE — Telephone Encounter (Signed)
Please advise if this is the meter he is using? Please advise if okay to fill.

## 2017-03-05 ENCOUNTER — Encounter: Payer: Self-pay | Admitting: Physical Therapy

## 2017-03-05 ENCOUNTER — Telehealth: Payer: Self-pay | Admitting: Physical Therapy

## 2017-03-05 ENCOUNTER — Ambulatory Visit: Payer: Medicare Other | Admitting: Physical Therapy

## 2017-03-05 DIAGNOSIS — M6281 Muscle weakness (generalized): Secondary | ICD-10-CM

## 2017-03-05 DIAGNOSIS — R262 Difficulty in walking, not elsewhere classified: Secondary | ICD-10-CM

## 2017-03-05 DIAGNOSIS — R2681 Unsteadiness on feet: Secondary | ICD-10-CM

## 2017-03-05 DIAGNOSIS — M25671 Stiffness of right ankle, not elsewhere classified: Secondary | ICD-10-CM

## 2017-03-05 NOTE — Therapy (Signed)
[facility] [address] [place], [place], [address] Phone: [phone]   Fax:  [phone]  Physical Therapy Treatment  Patient Details  Name: Johnathan Ross MRN: [id number] Date of Birth: [birth year] Referring Provider: Altamese [place], MD  Encounter Date: 03/05/2017      PT End of Session - 03/05/17 0852    Visit Number 17  G-code due visit number 18   Number of Visits 30  per recertification   Date for PT Re-Evaluation 04/23/17   Authorization Type Kx modifier at visit 15, G-codes and PN every 10th visit   PT Start Time 0848   PT Stop Time 0928   PT Time Calculation (min) 40 min   Activity Tolerance Patient tolerated treatment well   Behavior During Therapy [facility] for tasks assessed/performed      Past Medical History:  Diagnosis Date  . Hyperlipidemia   . Hypertension   . Hypogonadism in male 11/01/2016  . Hypothyroidism   . Multiple sclerosis ([place])   . 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 Type 1 diabetes mellitus ([place]) dx'd 1994  . Vitamin D insufficiency 11/29/2016    Past Surgical History:  Procedure Laterality Date  . EYE SURGERY Bilateral    "laser for diabetic retinopathy"  . FRACTURE SURGERY    . OPEN REDUCTION INTERNAL FIXATION (ORIF) TIBIA/FIBULA FRACTURE Right 10/30/2016  . ORIF ANKLE FRACTURE Left 2015  . ORIF TIBIA FRACTURE Right 10/30/2016   Procedure: OPEN REDUCTION INTERNAL FIXATION (ORIF) TIBIA FIBULA FRACTURE;  Surgeon: Altamese [place], MD;  Location: [facility];  Service: Orthopedics;  Laterality: Right;  . RETINAL LASER PROCEDURE Bilateral    "for diabetic retinopathy"    There were no vitals filed for this visit.      Subjective Assessment - 03/05/17 0852    Subjective No new complaints. No falls to report.    Patient is accompained by: Family member  mom   Pertinent History MS dx about 6 years ago   How long can you sit comfortably? unlimited   How long can you stand  comfortably? unlimited   How long can you walk comfortably? household distances due to LE fatigue/weakness   Diagnostic tests X-ray   Patient Stated Goals Walk without AD   Currently in Pain? No/denies            [facility] Adult PT Treatment/Exercise - 03/05/17 0853      Ambulation/Gait   Ambulation/Gait Yes   Ambulation/Gait Assistance 5: Supervision   Ambulation/Gait Assistance Details 4 short standing rest breaks needed during this distance due to pt reported fatigue. right foot drag/catching at times with pt stopping to "regroup himselft" before starting again.    Ambulation Distance (Feet) 110 Feet   Assistive device Rolling walker   Gait Pattern Step-through pattern;Decreased stride length;Decreased step length - right;Decreased step length - left;Decreased hip/knee flexion - right;Decreased hip/knee flexion - left;Poor foot clearance - right   Ambulation Surface Level;Indoor     Neuro Re-ed    Neuro Re-ed Details  tall kneeling on mat: mini squats for balance and strengthening with posture sway/fatigue needing assist for form and balance; tall kneeling: alternating UE raises x 10 reps each side with cues on form and posture during ex's; tall kneeling: upper trunk rotation left<>right with reaching behind him x 10 each way with cues on posture/ex form. assist needed due to postural sway/fatigue with ex's.      Knee/Hip Exercises: Stretches   Active Hamstring Stretch Both;2 reps;30 seconds;Limitations  Active Hamstring Stretch Limitations using belt with assist to maintain knee extension, cues on form/technique   Other Knee/Hip Stretches single knee to chest, Bilateral x2, 30 sec hold 10 second rest between each     Knee/Hip Exercises: Seated   Long Arc Quad AROM;Strengthening;Both;1 set;10 reps;Limitations;Weights   Long Arc Quad Weight 2 lbs.   Long [employer] Limitations 3-4 sec holds each rep with cues on maintaining tall posture and for ex form/technique     Knee/Hip  Exercises: Supine   Bridges AROM;Strengthening;Both;1 set;10 reps;Limitations   Bridges Limitations Ball squeeze 5 sec holds with arms at sides, cues on hold times, cues on hip extension with each rep.      Knee/Hip Exercises: Prone   Hamstring Curl 1 set;10 reps   Hamstring Curl Limitations Both LE's with red band resistance, RLE 3 with resistance remainder without with assist for form/technique (due to fatigue)    Hip Extension AROM;Strengthening;Both;10 reps;Limitations   Hip Extension Limitations glute kick back with knee flexion, assistance to stabilize foot   Straight Leg Raises --   Straight Leg Raises Limitations --               PT Short Term Goals - 02/22/17 1104      PT SHORT TERM GOAL #1   Title Pt will perform standing HEP with supervision of family   Time 4   Period Weeks   Status Revised   Target Date 03/24/17     PT SHORT TERM GOAL #2   Title Pt will ambulate 250' on level surfaces with cane with supervision   Time 4   Period Weeks   Status Revised   Target Date 03/24/17     PT SHORT TERM GOAL #3   Title Pt will improve R ankle DF ROM to neutral (0) and increase PF by 10 deg   Baseline -10 DF, 26 PF   Time 4   Period Weeks   Status New   Target Date 03/24/17     PT SHORT TERM GOAL #4   Title Pt will demonstrate improved LE strength as indicated by decrease in five time sit to stand < or = 15 seconds   Baseline 18.16 with UE support on arm chair   Time 4   Period Weeks   Status New   Target Date 03/24/17     PT SHORT TERM GOAL #5   Title Pt will improve standing balance as indicated by increase in BERG balance score to > or = 42/56   Baseline 38/56   Time 4   Period Weeks   Status New   Target Date 03/24/17     Additional Short Term Goals   Additional Short Term Goals Yes     PT SHORT TERM GOAL #6   Title Pt will improve safety with gait as indicated by increase in gait velocity to >1.5 ft/sec with LRAD   Baseline 1.26 ft/sec with RW    Time 4   Period Weeks   Status New   Target Date 03/24/17           PT Long Term Goals - 02/22/17 1109      PT LONG TERM GOAL #1   Title Pt will ambulate 300' on a variety of level indoor and outdoor paved surfaces with LRAD and supervision   Baseline 02/22/17: pt continues to need RW for stability, have trialed straight cane with min to mod assist needed   Time 8   Period Weeks  Status Revised   Target Date 04/23/17     PT LONG TERM GOAL #2   Title Pt will improve R ankle ROM to 5-10 deg ankle DF and >40 deg PF   Baseline reassess at 4 weeks   Time 8   Period Weeks   Status Revised   Target Date 04/23/17     PT LONG TERM GOAL #3   Title Pt will improve LE strength as indicated by improvement in five time sit to stand to < or = 12 seconds   Time 8   Period Weeks   Status Revised   Target Date 04/23/17     PT LONG TERM GOAL #5   Title Pt will decrease falls risk as indicated by increase in BERG score to > or = 48/56   Time 8   Period Weeks   Status Revised   Target Date 04/23/17     PT LONG TERM GOAL #6   Title Pt will demonstrate ability to perform standing balance and strengthening HEP safely MOD I   Time 8   Period Weeks   Status Revised   Target Date 04/23/17     PT LONG TERM GOAL #7   Title Pt will improve gait velocity to > or = 1.8 ft/sec to indicate decreased risk for falls during gait in community   Baseline 02/22/17: 1.26 ft/sec with RW with 2 standing rest breaks for ~5-6 sec's each   Time 8   Period Weeks   Status Revised   Target Date 04/23/17            Plan - 03/05/17 6962    Clinical Impression Statement Today's skilled session focused on gait some, mostly on LE/core strengthening with rest breaks taken due to fatigue. Pt is progressing toward goals and should benefit from continued PT to progress toward unmet goals.    Rehab Potential Good   Clinical Impairments Affecting Rehab Potential MS    PT Frequency 2x / week   PT Duration 8  weeks   PT Treatment/Interventions ADLs/Self Care Home Management;Cryotherapy;Vasopneumatic Device;Taping;Gait training;Stair training;Functional mobility training;Therapeutic activities;Therapeutic exercise;Balance training;Neuromuscular re-education;Passive range of motion;Manual techniques;Patient/family education;Electrical Stimulation;DME Instruction;Orthotic Fit/Training;Energy conservation   PT Next Visit Plan G-code due next visit; continue to address gait, ? bracing for DF/knee control, LE strenghening especially hamstring and hip flexor, stairs, rocker board for ankle ROM and strength- monitor HR and fatigue levelconts   PT Home Exercise Plan Continue with ankle HEP with addition of balance exercises   Consulted and Agree with Plan of Care Patient      Patient will benefit from skilled therapeutic intervention in order to improve the following deficits and impairments:  Abnormal gait, Pain, Decreased range of motion, Decreased balance, Decreased mobility, Difficulty walking, Decreased activity tolerance, Decreased strength  Visit Diagnosis: Muscle weakness (generalized)  Unsteadiness on feet  Difficulty in walking, not elsewhere classified  Stiffness of right ankle, not elsewhere classified     Problem List Patient Active Problem List   Diagnosis Date Noted  . Vitamin D insufficiency 11/29/2016  . Hypogonadism in male 11/01/2016  . Type 1 diabetes mellitus ([place])   . Hypothyroidism   . Hypertension   . Hyperlipidemia   . Closed fracture of right fibula and tibia 10/29/2016  . Closed tibia fracture 10/29/2016  . Closed fracture of right tibial plafond with fibula involvement 10/29/2016  . Abnormal liver function tests 03/06/2013  . RASH AND OTHER NONSPECIFIC SKIN ERUPTION 10/07/2009  . SHOULDER PAIN, LEFT 12/27/2008  .  Type I (juvenile type) diabetes mellitus without mention of complication, uncontrolled 12/03/2007  . HYPOTHYROIDISM 11/12/2007  . HYPERCHOLESTEROLEMIA  11/12/2007  . MULTIPLE SCLEROSIS 08/29/2007  . PROLIFERATIVE DIABETIC RETINOPATHY 08/29/2007  . HYPERTENSION 12/25/2006    Willow Ora, PTA, [facility] [address] [place], [place] [address] [phone] 03/05/17, 8:42 PM   Name: BUCKLEY BRADLY MRN: [id number] Date of Birth: [birth year]

## 2017-03-05 NOTE — Telephone Encounter (Signed)
Entered in error  Raylene Everts, PT, DPT 03/05/17    9:06 AM

## 2017-03-06 ENCOUNTER — Telehealth: Payer: Self-pay | Admitting: Physical Therapy

## 2017-03-06 ENCOUNTER — Other Ambulatory Visit: Payer: Self-pay | Admitting: Endocrinology

## 2017-03-06 NOTE — Telephone Encounter (Signed)
Patient's sister continues to be concerned about patient's cognition and emotional status.  Therapy has also noted impairments with cognition and flat affect.  Patient's sister would like pt to have a  consult to neuropsychiatry.  PT has contacted Neurologist about concerns and has requested referral to Ilean Skill, PsyD with [facility] [place], [place] [address]  Main: [phone] Fax: [phone]    Dr. Bjorn Loser requested that referring information be sent to him via inbasket to allow him to route the referral to the appropriate provider.  Unable to send InBasket message to Dr. Bjorn Loser (due to differing health care systems) but will call back to his office tomorrow to provide information.  Raylene Everts, PT, DPT 03/06/17    9:14 PM

## 2017-03-07 ENCOUNTER — Ambulatory Visit: Payer: Medicare Other | Admitting: Physical Therapy

## 2017-03-11 ENCOUNTER — Telehealth: Payer: Self-pay | Admitting: Endocrinology

## 2017-03-11 ENCOUNTER — Ambulatory Visit: Payer: Medicare Other | Admitting: Physical Therapy

## 2017-03-11 ENCOUNTER — Other Ambulatory Visit: Payer: Self-pay

## 2017-03-11 ENCOUNTER — Encounter: Payer: Self-pay | Admitting: Physical Therapy

## 2017-03-11 DIAGNOSIS — M6281 Muscle weakness (generalized): Secondary | ICD-10-CM | POA: Diagnosis not present

## 2017-03-11 DIAGNOSIS — M25671 Stiffness of right ankle, not elsewhere classified: Secondary | ICD-10-CM

## 2017-03-11 DIAGNOSIS — R2681 Unsteadiness on feet: Secondary | ICD-10-CM | POA: Diagnosis not present

## 2017-03-11 DIAGNOSIS — R262 Difficulty in walking, not elsewhere classified: Secondary | ICD-10-CM

## 2017-03-11 MED ORDER — LEVOTHYROXINE SODIUM 150 MCG PO TABS: 150.0000 ug | ORAL_TABLET | Freq: Every day | ORAL | 1 refills | Status: DC

## 2017-03-11 NOTE — Patient Instructions (Addendum)
Functional Quadriceps: Sit to Stand    Sit on edge of chair, feet flat on floor with walker or counter in front of you. Using one hand or no hands on arm rests - Stand upright, extending knees fully. Repeat __10-12__ times per set. Do __2__ sets per session. Do _1-2___ sessions per day.   SINGLE LIMB STANCE    Stance:STAND FACING A COUNTER-HOLD COUNTER WITH BOTH HANDS single leg on floor. Raise leg. Hold __10_ seconds. Repeat with other leg. _3_ reps per set, __2_ sets per day.  Tandem Stance    STAND FACING A COUNTER, HOLD TO COUNTER WITH BOTH HANDS.  Right foot in front of left, heel touching toe both feet "straight ahead". Balance in this position _12__ seconds while performing 10 reps each head nods up/down, head turns side to side. Repeat head turns with left foot in front of right.  .    Feet Together - Eyes Closed      With eyes closed and feet together, KEEP HEAD STILL-HOLD FOR 10-12 SECONDS.  HOLD CHAIR IN FRONT OF YOU FOR BALANCE Continue to hold the chair: perform 10 repetitions of head nods up/down, head turns left and right. Repeat 1-2 times per session. Do 2 sessions per day.

## 2017-03-11 NOTE — Telephone Encounter (Signed)
Please send 150 g levothyroxine with extra half tablet weekly

## 2017-03-11 NOTE — Telephone Encounter (Signed)
Please advise if there is a dosage change to patients medication. Note says consider changing prescription? Please advise.

## 2017-03-11 NOTE — Telephone Encounter (Signed)
Called patient and let him know that I have sent this prescription over for him and went over the new dosage instructions.

## 2017-03-11 NOTE — Telephone Encounter (Signed)
Pt is asking about the thyorid med, he has ran out so the new increased dosing needs to be called in [facility] to [facility] [place]

## 2017-03-11 NOTE — Therapy (Signed)
[facility] [address] [place], [place], [address] Phone: [phone]   Fax:  [phone]  Physical Therapy Treatment  Patient Details  Name: Johnathan Ross MRN: [id number] Date of Birth: Sep 10, 1970 Referring Provider: Altamese [place]om[place], MD  Encounter Date: 03/11/2017      PT End of Session - 03/11/17 0935    Visit Number 18  G-code due visit number 28   Number of Visits 30  per recertification   Date for PT Re-Evaluation 04/23/17   Authorization Type Kx modifier at visit 15, G-codes and PN every 10th visit   PT Start Time 0850   PT Stop Time 0933   PT Time Calculation (min) 43 min   Activity Tolerance Patient tolerated treatment well   Behavior During Therapy [facility] for tasks assessed/performed      Past Medical History:  Diagnosis Date  . Hyperlipidemia   . Hypertension   . Hypogonadism in male 11/01/2016  . Hypothyroidism   . Multiple sclerosis ([place])   . 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 Type 1 diabetes mellitus ([place]) dx'd 1994  . Vitamin D insufficiency 11/29/2016    Past Surgical History:  Procedure Laterality Date  . EYE SURGERY Bilateral    "laser for diabetic retinopathy"  . FRACTURE SURGERY    . OPEN REDUCTION INTERNAL FIXATION (ORIF) TIBIA/FIBULA FRACTURE Right 10/30/2016  . ORIF ANKLE FRACTURE Left 2015  . ORIF TIBIA FRACTURE Right 10/30/2016   Procedure: OPEN REDUCTION INTERNAL FIXATION (ORIF) TIBIA FIBULA FRACTURE;  Surgeon: Altamese [place], MD;  Location: [facility];  Service: Orthopedics;  Laterality: Right;  . RETINAL LASER PROCEDURE Bilateral    "for diabetic retinopathy"    There were no vitals filed for this visit.      Subjective Assessment - 03/11/17 0853    Subjective Power is still out at home.  No falls or pain to report.   Patient is accompained by: Family member  mom   Pertinent History MS dx about 6 years ago   How long can you sit comfortably? unlimited   How long  can you stand comfortably? unlimited   How long can you walk comfortably? household distances due to LE fatigue/weakness   Diagnostic tests X-ray   Patient Stated Goals Walk without AD   Currently in Pain? No/denies            [facility] PT Assessment - 03/11/17 0853      Standardized Balance Assessment   Standardized Balance Assessment Five Times Sit to Stand;Berg Balance Test   Five times sit to stand comments  13.22 seconds with UE      Berg Balance Test   Sit to Stand Able to stand  independently using hands   Standing Unsupported Able to stand safely 2 minutes   Sitting with Back Unsupported but Feet Supported on Floor or Stool Able to sit safely and securely 2 minutes   Stand to Sit Controls descent by using hands   Transfers Able to transfer safely, definite need of hands   Standing Unsupported with Eyes Closed Able to stand 10 seconds with supervision   Standing Ubsupported with Feet Together Able to place feet together independently but unable to hold for 30 seconds   From Standing, Reach Forward with Outstretched Arm Can reach confidently >25 cm (10")   From Standing Position, Pick up Object from Floor Able to pick up shoe, needs supervision   From Standing Position, Turn to Look Behind Over each Shoulder Looks behind one  side only/other side shows less weight shift   Turn 360 Degrees Needs close supervision or verbal cueing   Standing Unsupported, Alternately Place Feet on Step/Stool Able to complete >2 steps/needs minimal assist   Standing Unsupported, One Foot in Front Able to take small step independently and hold 30 seconds   Standing on One Leg Tries to lift leg/unable to hold 3 seconds but remains standing independently   Total Score 37   Berg comment: 37/56 significant risk for falls       Functional Quadriceps: Sit to Stand    Sit on edge of chair, feet flat on floor with walker or counter in front of you. Using one hand or no hands on arm rests - Stand upright,  extending knees fully. Repeat __10-12__ times per set. Do __2__ sets per session. Do _1-2___ sessions per day.   SINGLE LIMB STANCE    Stance:STAND FACING A COUNTER-HOLD COUNTER WITH BOTH HANDS single leg on floor. Raise leg. Hold __10_ seconds. Repeat with other leg. _3_ reps per set, __2_ sets per day.  Tandem Stance    STAND FACING A COUNTER, HOLD TO COUNTER WITH BOTH HANDS.  Right foot in front of left, heel touching toe both feet "straight ahead". Balance in this position _12__ seconds while performing 10 reps each head nods up/down, head turns side to side. Repeat head turns with left foot in front of right.  .    Feet Together - Eyes Closed      With eyes closed and feet together, KEEP HEAD STILL-HOLD FOR 10-12 SECONDS.  HOLD CHAIR IN FRONT OF YOU FOR BALANCE Continue to hold the chair: perform 10 repetitions of head nods up/down, head turns left and right. Repeat 1-2 times per session. Do 2 sessions per day.                       PT Education - 03/11/17 0935    Education provided Yes   Education Details progress towards goals; progressed standing balance HEP   Person(s) Educated Patient;Parent(s)   Methods Explanation;Demonstration;Handout   Comprehension Verbalized understanding;Returned demonstration          PT Short Term Goals - 02/22/17 1104      PT SHORT TERM GOAL #1   Title Pt will perform standing HEP with supervision of family   Time 4   Period Weeks   Status Revised   Target Date 03/24/17     PT SHORT TERM GOAL #2   Title Pt will ambulate 250' on level surfaces with cane with supervision   Time 4   Period Weeks   Status Revised   Target Date 03/24/17     PT SHORT TERM GOAL #3   Title Pt will improve R ankle DF ROM to neutral (0) and increase PF by 10 deg   Baseline -10 DF, 26 PF   Time 4   Period Weeks   Status New   Target Date 03/24/17     PT SHORT TERM GOAL #4   Title Pt will demonstrate improved LE strength  as indicated by decrease in five time sit to stand < or = 15 seconds   Baseline 18.16 with UE support on arm chair   Time 4   Period Weeks   Status New   Target Date 03/24/17     PT SHORT TERM GOAL #5   Title Pt will improve standing balance as indicated by increase in BERG balance score to > or =  42/56   Baseline 38/56   Time 4   Period Weeks   Status New   Target Date 03/24/17     Additional Short Term Goals   Additional Short Term Goals Yes     PT SHORT TERM GOAL #6   Title Pt will improve safety with gait as indicated by increase in gait velocity to >1.5 ft/sec with LRAD   Baseline 1.26 ft/sec with RW   Time 4   Period Weeks   Status New   Target Date 03/24/17           PT Long Term Goals - 02/22/17 1109      PT LONG TERM GOAL #1   Title Pt will ambulate 300' on a variety of level indoor and outdoor paved surfaces with LRAD and supervision   Baseline 02/22/17: pt continues to need RW for stability, have trialed straight cane with min to mod assist needed   Time 8   Period Weeks   Status Revised   Target Date 04/23/17     PT LONG TERM GOAL #2   Title Pt will improve R ankle ROM to 5-10 deg ankle DF and >40 deg PF   Baseline reassess at 4 weeks   Time 8   Period Weeks   Status Revised   Target Date 04/23/17     PT LONG TERM GOAL #3   Title Pt will improve LE strength as indicated by improvement in five time sit to stand to < or = 12 seconds   Time 8   Period Weeks   Status Revised   Target Date 04/23/17     PT LONG TERM GOAL #5   Title Pt will decrease falls risk as indicated by increase in BERG score to > or = 48/56   Time 8   Period Weeks   Status Revised   Target Date 04/23/17     PT LONG TERM GOAL #6   Title Pt will demonstrate ability to perform standing balance and strengthening HEP safely MOD I   Time 8   Period Weeks   Status Revised   Target Date 04/23/17     PT LONG TERM GOAL #7   Title Pt will improve gait velocity to > or = 1.8  ft/sec to indicate decreased risk for falls during gait in community   Baseline 02/22/17: 1.26 ft/sec with RW with 2 standing rest breaks for ~5-6 sec's each   Time 8   Period Weeks   Status Revised   Target Date 04/23/17               Plan - 03/11/17 0936    Clinical Impression Statement Treatment session today focused on reassessment of progress with LE strength and balance for 10th visit PN/G code with reassessment of BERG and five times sit to stand.  Pt does demonstrate improved proximal hip strength as indicated by decrease in five time sit to stand by 5 seconds but standing balance remained the same and pt continues to demonstrate significant falls risk. Pt continues to report significant fatigue with prolonged standing/ambulation and delayed initiation when attempting to stand from couch after prolonged sitting.  Rest of session focused on progression of standing balance exercises; handout provided.  Will continue to address to progress towards LTG.   Rehab Potential Good   Clinical Impairments Affecting Rehab Potential MS    PT Frequency 2x / week   PT Duration 8 weeks   PT Treatment/Interventions ADLs/Self Care Home Management;Cryotherapy;Vasopneumatic  Device;Taping;Gait training;Stair training;Functional mobility training;Therapeutic activities;Therapeutic exercise;Balance training;Neuromuscular re-education;Passive range of motion;Manual techniques;Patient/family education;Electrical Stimulation;DME Instruction;Orthotic Fit/Training;Energy conservation   PT Next Visit Plan continue to address gait, need to begin to determine and get order for most appropriate AFO for DF/knee control, LE strengthening especially hamstring and hip flexor, stairs, rocker board for ankle ROM and strength- monitor HR and fatigue level   PT Home Exercise Plan Continue with ankle HEP with addition of balance exercises   Consulted and Agree with Plan of Care Patient      Patient will benefit from  skilled therapeutic intervention in order to improve the following deficits and impairments:  Abnormal gait, Pain, Decreased range of motion, Decreased balance, Decreased mobility, Difficulty walking, Decreased activity tolerance, Decreased strength  Visit Diagnosis: Muscle weakness (generalized)  Unsteadiness on feet  Difficulty in walking, not elsewhere classified  Stiffness of right ankle, not elsewhere classified       G-Codes - Apr 09, 2017 0934    Functional Assessment Tool Used (Outpatient Only) BERG 37/56, five times sit to stand 13.22 seconds   Functional Limitation Mobility: Walking and moving around   Mobility: Walking and Moving Around Current Status [phone]) At least 20 percent but less than 40 percent impaired, limited or restricted   Mobility: Walking and Moving Around Goal Status [phone]) At least 20 percent but less than 40 percent impaired, limited or restricted      Problem List Patient Active Problem List   Diagnosis Date Noted  . Vitamin D insufficiency 11/29/2016  . Hypogonadism in male 11/01/2016  . Type 1 diabetes mellitus ([place])   . Hypothyroidism   . Hypertension   . Hyperlipidemia   . Closed fracture of right fibula and tibia 10/29/2016  . Closed tibia fracture 10/29/2016  . Closed fracture of right tibial plafond with fibula involvement 10/29/2016  . Abnormal liver function tests 03/06/2013  . RASH AND OTHER NONSPECIFIC SKIN ERUPTION 10/07/2009  . SHOULDER PAIN, LEFT 12/27/2008  . Type I (juvenile type) diabetes mellitus without mention of complication, uncontrolled 12/03/2007  . HYPOTHYROIDISM 11/12/2007  . HYPERCHOLESTEROLEMIA 11/12/2007  . MULTIPLE SCLEROSIS 08/29/2007  . PROLIFERATIVE DIABETIC RETINOPATHY 08/29/2007  . HYPERTENSION 12/25/2006   Physical Therapy Progress Note  Dates of Reporting Period: 01/30/17 to 04-09-2017  Objective Reports of Subjective Statement: See impression statement above; pt is making slow but steady progress towards  goals.  Objective Measurements: BERG, five times sit to stand  Goal Update: See STG and LTG above  Plan: Continue POC  Reason Skilled Services are Required: To continue to address LE weakness, impaired endurance/activity tolerance, impaired balance, gait and decreased ROM to decrease falls risk and maximize functional mobility independence.  Raylene Everts, PT, DPT 2017/04/09    9:48 AM    [facility] [address] [place], [place], [address] Phone: [phone]   Fax:  [phone]  Name: LAVON BOTHWELL MRN: [id number] Date of Birth: 1971/03/11

## 2017-03-13 ENCOUNTER — Encounter: Payer: Self-pay | Admitting: Physical Therapy

## 2017-03-13 ENCOUNTER — Other Ambulatory Visit: Payer: Self-pay

## 2017-03-13 ENCOUNTER — Ambulatory Visit: Payer: Medicare Other | Admitting: Physical Therapy

## 2017-03-13 DIAGNOSIS — R2681 Unsteadiness on feet: Secondary | ICD-10-CM

## 2017-03-13 DIAGNOSIS — R262 Difficulty in walking, not elsewhere classified: Secondary | ICD-10-CM

## 2017-03-13 DIAGNOSIS — M6281 Muscle weakness (generalized): Secondary | ICD-10-CM

## 2017-03-13 DIAGNOSIS — M25671 Stiffness of right ankle, not elsewhere classified: Secondary | ICD-10-CM | POA: Diagnosis not present

## 2017-03-13 MED ORDER — INSULIN ASPART 100 UNIT/ML ~~LOC~~ SOLN: SUBCUTANEOUS | 2 refills | Status: DC

## 2017-03-14 DIAGNOSIS — E103553 Type 1 diabetes mellitus with stable proliferative diabetic retinopathy, bilateral: Secondary | ICD-10-CM | POA: Diagnosis not present

## 2017-03-14 DIAGNOSIS — H35373 Puckering of macula, bilateral: Secondary | ICD-10-CM | POA: Diagnosis not present

## 2017-03-14 DIAGNOSIS — H18832 Recurrent erosion of cornea, left eye: Secondary | ICD-10-CM | POA: Diagnosis not present

## 2017-03-14 DIAGNOSIS — H31009 Unspecified chorioretinal scars, unspecified eye: Secondary | ICD-10-CM | POA: Diagnosis not present

## 2017-03-14 NOTE — Therapy (Signed)
[facility] [address] [place], [place], [address] Phone: [phone]   Fax:  [phone]  Physical Therapy Treatment  Patient Details  Name: Johnathan Ross MRN: [id number] Date of Birth: 05-10-1971 Referring Provider: Altamese [place], MD  Encounter Date: 03/13/2017      PT End of Session - 03/13/17 0854    Visit Number 19  G-code due visit number 28   Number of Visits 30  per recertification   Date for PT Re-Evaluation 04/23/17   Authorization Type Kx modifier at visit 15, G-codes and PN every 10th visit   PT Start Time 0849   PT Stop Time 0930   PT Time Calculation (min) 41 min   Equipment Utilized During Treatment Gait belt   Activity Tolerance Patient tolerated treatment well;Patient limited by fatigue   Behavior During Therapy [facility] for tasks assessed/performed      Past Medical History:  Diagnosis Date  . Hyperlipidemia   . Hypertension   . Hypogonadism in male 11/01/2016  . Hypothyroidism   . Multiple sclerosis ([place])   . 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 Type 1 diabetes mellitus ([place]) dx'd 1994  . Vitamin D insufficiency 11/29/2016    Past Surgical History:  Procedure Laterality Date  . EYE SURGERY Bilateral    "laser for diabetic retinopathy"  . FRACTURE SURGERY    . OPEN REDUCTION INTERNAL FIXATION (ORIF) TIBIA/FIBULA FRACTURE Right 10/30/2016  . ORIF ANKLE FRACTURE Left 2015  . ORIF TIBIA FRACTURE Right 10/30/2016   Procedure: OPEN REDUCTION INTERNAL FIXATION (ORIF) TIBIA FIBULA FRACTURE;  Surgeon: Altamese [place], MD;  Location: [facility];  Service: Orthopedics;  Laterality: Right;  . RETINAL LASER PROCEDURE Bilateral    "for diabetic retinopathy"    There were no vitals filed for this visit.      Subjective Assessment - 03/13/17 0854    Subjective No new complaints. No falls or pain to report.   Patient is accompained by: Family member   Pertinent History MS dx about 6 years ago    How long can you sit comfortably? unlimited   How long can you stand comfortably? unlimited   How long can you walk comfortably? household distances due to LE fatigue/weakness   Diagnostic tests X-ray   Patient Stated Goals Walk without AD   Currently in Pain? No/denies              [facility] Adult PT Treatment/Exercise - 03/13/17 0902      Neuro Re-ed    Neuro Re-ed Details  performed both ways on balance board: EO rocking board, progressing to holding board steady with EC, progressing to EO head movements while keeping board steady.      Exercises   Other Exercises  reviewed all standing balance exercises issued at previous session with min cues on ex form/technique     Knee/Hip Exercises: Standing   Heel Raises Both;1 set;10 reps;5 seconds;Limitations   Heel Raises Limitations UE support on charir back for balance.   Hip Abduction AROM;Stengthening;Both;2 sets;5 reps;Knee straight;Limitations   Abduction Limitations UE support on chair back, alternating legs with cues on posture and ex form.      Knee/Hip Exercises: Seated   Stool Scoot - Round Trips seated in office chair: forward pulls for hamstring strengthening x 115 feet with short rest breaks taken throughout. pt used hands on knees to assist with trunk stabilityl             PT Short Term Goals -  02/22/17 1104      PT SHORT TERM GOAL #1   Title Pt will perform standing HEP with supervision of family   Time 4   Period Weeks   Status Revised   Target Date 03/24/17     PT SHORT TERM GOAL #2   Title Pt will ambulate 250' on level surfaces with cane with supervision   Time 4   Period Weeks   Status Revised   Target Date 03/24/17     PT SHORT TERM GOAL #3   Title Pt will improve R ankle DF ROM to neutral (0) and increase PF by 10 deg   Baseline -10 DF, 26 PF   Time 4   Period Weeks   Status New   Target Date 03/24/17     PT SHORT TERM GOAL #4   Title Pt will demonstrate improved LE strength as  indicated by decrease in five time sit to stand < or = 15 seconds   Baseline 18.16 with UE support on arm chair   Time 4   Period Weeks   Status New   Target Date 03/24/17     PT SHORT TERM GOAL #5   Title Pt will improve standing balance as indicated by increase in BERG balance score to > or = 42/56   Baseline 38/56   Time 4   Period Weeks   Status New   Target Date 03/24/17     Additional Short Term Goals   Additional Short Term Goals Yes     PT SHORT TERM GOAL #6   Title Pt will improve safety with gait as indicated by increase in gait velocity to >1.5 ft/sec with LRAD   Baseline 1.26 ft/sec with RW   Time 4   Period Weeks   Status New   Target Date 03/24/17           PT Long Term Goals - 02/22/17 1109      PT LONG TERM GOAL #1   Title Pt will ambulate 300' on a variety of level indoor and outdoor paved surfaces with LRAD and supervision   Baseline 02/22/17: pt continues to need RW for stability, have trialed straight cane with min to mod assist needed   Time 8   Period Weeks   Status Revised   Target Date 04/23/17     PT LONG TERM GOAL #2   Title Pt will improve R ankle ROM to 5-10 deg ankle DF and >40 deg PF   Baseline reassess at 4 weeks   Time 8   Period Weeks   Status Revised   Target Date 04/23/17     PT LONG TERM GOAL #3   Title Pt will improve LE strength as indicated by improvement in five time sit to stand to < or = 12 seconds   Time 8   Period Weeks   Status Revised   Target Date 04/23/17     PT LONG TERM GOAL #5   Title Pt will decrease falls risk as indicated by increase in BERG score to > or = 48/56   Time 8   Period Weeks   Status Revised   Target Date 04/23/17     PT LONG TERM GOAL #6   Title Pt will demonstrate ability to perform standing balance and strengthening HEP safely MOD I   Time 8   Period Weeks   Status Revised   Target Date 04/23/17     PT LONG TERM GOAL #7  Title Pt will improve gait velocity to > or = 1.8 ft/sec  to indicate decreased risk for falls during gait in community   Baseline 02/22/17: 1.26 ft/sec with RW with 2 standing rest breaks for ~5-6 sec's each   Time 8   Period Weeks   Status Revised   Target Date 04/23/17            Plan - 03/13/17 0855    Clinical Impression Statement Today's skilled session continued to focus on LE strengthening and static balance. Pt continues to fatigue with activity needing rest breaks. Pt is making slow progress toward goals and should benefit from continued PT to progress toward unmet goals.    Rehab Potential Good   Clinical Impairments Affecting Rehab Potential MS    PT Frequency 2x / week   PT Duration 8 weeks   PT Treatment/Interventions ADLs/Self Care Home Management;Cryotherapy;Vasopneumatic Device;Taping;Gait training;Stair training;Functional mobility training;Therapeutic activities;Therapeutic exercise;Balance training;Neuromuscular re-education;Passive range of motion;Manual techniques;Patient/family education;Electrical Stimulation;DME Instruction;Orthotic Fit/Training;Energy conservation   PT Next Visit Plan continue to address gait, need to begin to determine and get order for most appropriate AFO for DF/knee control, LE strengthening especially hamstring and hip flexor, stairs, rocker board for ankle ROM and strength- monitor HR and fatigue level   PT Home Exercise Plan Continue with ankle HEP with addition of balance exercises   Consulted and Agree with Plan of Care Patient      Patient will benefit from skilled therapeutic intervention in order to improve the following deficits and impairments:  Abnormal gait, Pain, Decreased range of motion, Decreased balance, Decreased mobility, Difficulty walking, Decreased activity tolerance, Decreased strength  Visit Diagnosis: Muscle weakness (generalized)  Unsteadiness on feet  Difficulty in walking, not elsewhere classified     Problem List Patient Active Problem List   Diagnosis Date  Noted  . Vitamin D insufficiency 11/29/2016  . Hypogonadism in male 11/01/2016  . Type 1 diabetes mellitus ([place])   . Hypothyroidism   . Hypertension   . Hyperlipidemia   . Closed fracture of right fibula and tibia 10/29/2016  . Closed tibia fracture 10/29/2016  . Closed fracture of right tibial plafond with fibula involvement 10/29/2016  . Abnormal liver function tests 03/06/2013  . RASH AND OTHER NONSPECIFIC SKIN ERUPTION 10/07/2009  . SHOULDER PAIN, LEFT 12/27/2008  . Type I (juvenile type) diabetes mellitus without mention of complication, uncontrolled 12/03/2007  . HYPOTHYROIDISM 11/12/2007  . HYPERCHOLESTEROLEMIA 11/12/2007  . MULTIPLE SCLEROSIS 08/29/2007  . PROLIFERATIVE DIABETIC RETINOPATHY 08/29/2007  . HYPERTENSION 12/25/2006   Willow Ora, PTA, [facility] [address] [place], [place] [address] [phone] 03/14/17, 1:05 PM   Name: Johnathan Ross MRN: [id number] Date of Birth: [birth year]

## 2017-03-18 ENCOUNTER — Ambulatory Visit: Payer: Medicare Other | Admitting: Physical Therapy

## 2017-03-18 ENCOUNTER — Other Ambulatory Visit (INDEPENDENT_AMBULATORY_CARE_PROVIDER_SITE_OTHER): Payer: Medicare Other

## 2017-03-18 ENCOUNTER — Encounter: Payer: Self-pay | Admitting: Physical Therapy

## 2017-03-18 DIAGNOSIS — M25671 Stiffness of right ankle, not elsewhere classified: Secondary | ICD-10-CM | POA: Diagnosis not present

## 2017-03-18 DIAGNOSIS — R262 Difficulty in walking, not elsewhere classified: Secondary | ICD-10-CM

## 2017-03-18 DIAGNOSIS — R2681 Unsteadiness on feet: Secondary | ICD-10-CM | POA: Diagnosis not present

## 2017-03-18 DIAGNOSIS — E063 Autoimmune thyroiditis: Secondary | ICD-10-CM | POA: Diagnosis not present

## 2017-03-18 DIAGNOSIS — E1065 Type 1 diabetes mellitus with hyperglycemia: Secondary | ICD-10-CM | POA: Diagnosis not present

## 2017-03-18 DIAGNOSIS — M6281 Muscle weakness (generalized): Secondary | ICD-10-CM

## 2017-03-18 LAB — BASIC METABOLIC PANEL
BUN: 13 mg/dL (ref 6–23)
CO2: 28 mEq/L (ref 19–32)
Calcium: 9.5 mg/dL (ref 8.4–10.5)
Chloride: 104 mEq/L (ref 96–112)
Creatinine, Ser: 1.09 mg/dL (ref 0.40–1.50)
GFR: 94.25 mL/min (ref >60.00)
Glucose, Bld: 143 mg/dL — ABNORMAL HIGH (ref 70–99)
Potassium: 4.4 mEq/L (ref 3.5–5.1)
Sodium: 139 mEq/L (ref 135–145)

## 2017-03-18 LAB — T4, FREE: Free T4: 1.16 ng/dL (ref 0.60–1.60)

## 2017-03-18 LAB — TSH: TSH: 0.78 u[IU]/mL (ref 0.35–4.50)

## 2017-03-19 LAB — FRUCTOSAMINE: Fructosamine: 331 umol/L — ABNORMAL HIGH (ref 0–285)

## 2017-03-19 NOTE — Therapy (Signed)
[facility] [address] [place], [place], [address] Phone: [phone]   Fax:  [phone]  Physical Therapy Treatment  Patient Details  Name: Johnathan Ross MRN: [id number] Date of Birth: 1971/04/23 Referring Provider: Altamese [place], MD  Encounter Date: 03/18/2017      PT End of Session - 03/18/17 2025    Visit Number 20  G-code due visit number 28   Number of Visits 30  per recertification   Date for PT Re-Evaluation 04/23/17   Authorization Type Kx modifier at visit 15, G-codes and PN every 10th visit   PT Start Time 0849   PT Stop Time 0934   PT Time Calculation (min) 45 min   Activity Tolerance Patient tolerated treatment well;Patient limited by fatigue   Behavior During Therapy [facility] for tasks assessed/performed      Past Medical History:  Diagnosis Date  . Hyperlipidemia   . Hypertension   . Hypogonadism in male 11/01/2016  . Hypothyroidism   . Multiple sclerosis ([place])   . 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 Type 1 diabetes mellitus ([place]) dx'd 1994  . Vitamin D insufficiency 11/29/2016    Past Surgical History:  Procedure Laterality Date  . EYE SURGERY Bilateral    "laser for diabetic retinopathy"  . FRACTURE SURGERY    . OPEN REDUCTION INTERNAL FIXATION (ORIF) TIBIA/FIBULA FRACTURE Right 10/30/2016  . ORIF ANKLE FRACTURE Left 2015  . ORIF TIBIA FRACTURE Right 10/30/2016   Procedure: OPEN REDUCTION INTERNAL FIXATION (ORIF) TIBIA FIBULA FRACTURE;  Surgeon: Altamese [place], MD;  Location: [facility];  Service: Orthopedics;  Laterality: Right;  . RETINAL LASER PROCEDURE Bilateral    "for diabetic retinopathy"    There were no vitals filed for this visit.      Subjective Assessment - 03/18/17 0852    Subjective No falls or issues to report.  Exercises are going well.  Does not notice that cold or hot weather affect his strength or energy.   Patient is accompained by: Family member    Pertinent History MS dx about 6 years ago   How long can you sit comfortably? unlimited   How long can you stand comfortably? unlimited   How long can you walk comfortably? household distances due to LE fatigue/weakness   Diagnostic tests X-ray   Patient Stated Goals Walk without AD   Currently in Pain? No/denies                         [facility] Adult PT Treatment/Exercise - 03/18/17 0919      Knee/Hip Exercises: Supine   Bridges Strengthening;Both;10 reps  bridge with alternating marching   Other Supine Knee/Hip Exercises Supine with bilat feet on ball, performed 10 reps each side: bilat straight leg bridges, bridge with hamstring curl 5 reps at a time, single leg stabilization on ball with contral lateral LE hip ABD to touch mat, single leg stabilization during alternating LE hip flexion (marches)             Balance Exercises - 03/18/17 0923      OTAGO PROGRAM   Heel Walking Support  unable to fully DF; increased genu recurvatum   Toe Walk Support  one UE support on counter, min A           PT Education - 03/18/17 2024    Education provided Yes   Education Details continued discussion regarding AFO   Person(s) Educated Patient;Parent(s)   Methods Explanation  Comprehension Verbalized understanding          PT Short Term Goals - 02/22/17 1104      PT SHORT TERM GOAL #1   Title Pt will perform standing HEP with supervision of family   Time 4   Period Weeks   Status Revised   Target Date 03/24/17     PT SHORT TERM GOAL #2   Title Pt will ambulate 250' on level surfaces with cane with supervision   Time 4   Period Weeks   Status Revised   Target Date 03/24/17     PT SHORT TERM GOAL #3   Title Pt will improve R ankle DF ROM to neutral (0) and increase PF by 10 deg   Baseline -10 DF, 26 PF   Time 4   Period Weeks   Status New   Target Date 03/24/17     PT SHORT TERM GOAL #4   Title Pt will demonstrate improved LE strength as  indicated by decrease in five time sit to stand < or = 15 seconds   Baseline 18.16 with UE support on arm chair   Time 4   Period Weeks   Status New   Target Date 03/24/17     PT SHORT TERM GOAL #5   Title Pt will improve standing balance as indicated by increase in BERG balance score to > or = 42/56   Baseline 38/56   Time 4   Period Weeks   Status New   Target Date 03/24/17     Additional Short Term Goals   Additional Short Term Goals Yes     PT SHORT TERM GOAL #6   Title Pt will improve safety with gait as indicated by increase in gait velocity to >1.5 ft/sec with LRAD   Baseline 1.26 ft/sec with RW   Time 4   Period Weeks   Status New   Target Date 03/24/17           PT Long Term Goals - 02/22/17 1109      PT LONG TERM GOAL #1   Title Pt will ambulate 300' on a variety of level indoor and outdoor paved surfaces with LRAD and supervision   Baseline 02/22/17: pt continues to need RW for stability, have trialed straight cane with min to mod assist needed   Time 8   Period Weeks   Status Revised   Target Date 04/23/17     PT LONG TERM GOAL #2   Title Pt will improve R ankle ROM to 5-10 deg ankle DF and >40 deg PF   Baseline reassess at 4 weeks   Time 8   Period Weeks   Status Revised   Target Date 04/23/17     PT LONG TERM GOAL #3   Title Pt will improve LE strength as indicated by improvement in five time sit to stand to < or = 12 seconds   Time 8   Period Weeks   Status Revised   Target Date 04/23/17     PT LONG TERM GOAL #5   Title Pt will decrease falls risk as indicated by increase in BERG score to > or = 48/56   Time 8   Period Weeks   Status Revised   Target Date 04/23/17     PT LONG TERM GOAL #6   Title Pt will demonstrate ability to perform standing balance and strengthening HEP safely MOD I   Time 8   Period Weeks  Status Revised   Target Date 04/23/17     PT LONG TERM GOAL #7   Title Pt will improve gait velocity to > or = 1.8 ft/sec  to indicate decreased risk for falls during gait in community   Baseline 02/22/17: 1.26 ft/sec with RW with 2 standing rest breaks for ~5-6 sec's each   Time 8   Period Weeks   Status Revised   Target Date 04/23/17               Plan - 03/19/17 8250    Clinical Impression Statement Continued discussion with pt regarding AFO recommendations.  Pt currently has AFO that provides adequate DF assist and is lightweight but due to LE weakness (hip and knee) that limits his ability to adequately lift his leg during swing he continues to drag his foot on the floor.  He does not feel that a new AFO would improve his foot clearance.  Also, to achieve greater ankle stability/support pt would have to have a more restrictive, heavier AFO which would further his limit ability to clear his foot during gait.  Pt would like to continue to address LE weakness before making a decision regarding obtaining a new AFO vs. training with current AFO.  Continued to focus on core, hamstring, hip flexor and ankle strengthening in supine and standing with multiple rest breaks due to fatigue.  Pt unable to perform heel walking.  Will continue to address and will continue to assess progress and need for AFO at next session.   Rehab Potential Good   Clinical Impairments Affecting Rehab Potential MS    PT Frequency 2x / week   PT Duration 8 weeks   PT Treatment/Interventions ADLs/Self Care Home Management;Cryotherapy;Vasopneumatic Device;Taping;Gait training;Stair training;Functional mobility training;Therapeutic activities;Therapeutic exercise;Balance training;Neuromuscular re-education;Passive range of motion;Manual techniques;Patient/family education;Electrical Stimulation;DME Instruction;Orthotic Fit/Training;Energy conservation   PT Next Visit Plan CHECK STG and make decision about keeping current AFO or new?  continue to address gait decreasing UE support, LE strengthening especially hamstring and hip flexor, stairs,  rocker board for ankle ROM and strength- monitor HR and fatigue level   PT Home Exercise Plan Continue with ankle HEP with addition of balance exercises   Consulted and Agree with Plan of Care Patient      Patient will benefit from skilled therapeutic intervention in order to improve the following deficits and impairments:  Abnormal gait, Pain, Decreased range of motion, Decreased balance, Decreased mobility, Difficulty walking, Decreased activity tolerance, Decreased strength  Visit Diagnosis: Muscle weakness (generalized)  Unsteadiness on feet  Difficulty in walking, not elsewhere classified  Stiffness of right ankle, not elsewhere classified     Problem List Patient Active Problem List   Diagnosis Date Noted  . Vitamin D insufficiency 11/29/2016  . Hypogonadism in male 11/01/2016  . Type 1 diabetes mellitus ([place])   . Hypothyroidism   . Hypertension   . Hyperlipidemia   . Closed fracture of right fibula and tibia 10/29/2016  . Closed tibia fracture 10/29/2016  . Closed fracture of right tibial plafond with fibula involvement 10/29/2016  . Abnormal liver function tests 03/06/2013  . RASH AND OTHER NONSPECIFIC SKIN ERUPTION 10/07/2009  . SHOULDER PAIN, LEFT 12/27/2008  . Type I (juvenile type) diabetes mellitus without mention of complication, uncontrolled 12/03/2007  . HYPOTHYROIDISM 11/12/2007  . HYPERCHOLESTEROLEMIA 11/12/2007  . MULTIPLE SCLEROSIS 08/29/2007  . PROLIFERATIVE DIABETIC RETINOPATHY 08/29/2007  . HYPERTENSION 12/25/2006    [name], PT, DPT 03/19/17    7:59 AM  [facility] [address] [place], [place], [address] Phone: [phone]   Fax:  [phone]  Name: KRITHIK MAPEL MRN: [id number] Date of Birth: 1971-01-03

## 2017-03-20 ENCOUNTER — Encounter: Payer: Self-pay | Admitting: Physical Therapy

## 2017-03-20 ENCOUNTER — Encounter: Payer: Self-pay | Admitting: Endocrinology

## 2017-03-20 ENCOUNTER — Ambulatory Visit: Payer: Medicare Other | Admitting: Physical Therapy

## 2017-03-20 DIAGNOSIS — R262 Difficulty in walking, not elsewhere classified: Secondary | ICD-10-CM

## 2017-03-20 DIAGNOSIS — M6281 Muscle weakness (generalized): Secondary | ICD-10-CM

## 2017-03-20 DIAGNOSIS — R2681 Unsteadiness on feet: Secondary | ICD-10-CM

## 2017-03-20 DIAGNOSIS — M25671 Stiffness of right ankle, not elsewhere classified: Secondary | ICD-10-CM

## 2017-03-20 NOTE — Progress Notes (Signed)
Subjective:              Patient ID: Johnathan Ross, male   DOB: 01-15-1971, 46 y.o.   MRN: [id number]  Diabetes    Diagnosis: Type 1 diabetes mellitus, date of diagnosis: 1995.   Insulin Pump followup:   CURRENT brand:  Medtronic  630 G   HISTORY: An insulin pump has been in use since 03/13/11.   PUMP settings are basal rate:   1.5 until 4 a.m; 4 AM = 1.35. 6 a.m.= 1.7. 9 AM = 1.4,11 a.m. = 0.9. 12 noon = 1.2, 2 p.m. = 1. 45. 3 PM = 2.15 and 6 p.m. = 1.8   Carbohydrate to insulin ratio: 1: 10, at 11 a.m. = 1: 10, after 6 p.m. = 1:7, hyperglycemia correction factor: 1: 30,and after 10 p.m. 1:30, target 120, active insulin time 4 hours.  Changing infusion set every 3-4 days  Recent history:  His A1c has Improved to 7.4, previously had gone up to 8% In February  Current blood sugar patterns Evaluated from  pump download, management and problems identified:   Blood sugar analysis from CGM is not done because he did not bring his meter today  He is currently checking his blood sugars on an average 4.6 times a day  He says that his sensor does not last for the 10 days and only 7-9 days but he has not checked with the company about this problem  AVERAGE blood sugar at home with fingersticks is 191; previously on his sensor his glucose was averaging 148  Appears that he has mostly a relatively high fat or high carbohydrate diet causing most of his POSTPRANDIAL hyperglycemia  Highest blood sugars are midday and afternoon from about 1 PM until 6 PM  His diet and mealtimes are quite variable  Occasionally may not do BOLUSES right at meal time including evening  Currently not active at all because of not recovering from his ankle fracture yet  FASTING blood sugars recently have been variable with a couple of good readings but otherwise high  HYPOGLYCEMIA has been infrequent, once had a 43 reading overnight because of possibly a bolus 3 hours prior to that  Also has not got  enough readings after his evening meal to assess his postprandial control  He thinks he can detect hypoglycemia symptoms fairly regularly Treatment of hypoglycemia: He is usually drinking juice for this  CGM results:  Mean values apply above for all meters except median for One Touch  PRE-MEAL Fasting Lunch Dinner Bedtime Overall  Glucose range: 93-330    1 49-313  76-276     Mean/median: 176     191+/-75    POST-MEAL PC Breakfast PC Lunch PC Dinner  Glucose range:    83-314   Mean/median:        EXERCISE: none recently  Wt Readings from Last 3 Encounters:  03/21/17 211 lb 9.6 oz (96 kg)  12/11/16 210 lb 6.4 oz (95.4 kg)  11/03/16 215 lb (97.5 kg)     Lab Results  Component Value Date   HGBA1C 7.4 03/21/2017   HGBA1C 7.6 (H) 12/06/2016   HGBA1C 8.0 (H) 07/06/2016   Lab Results  Component Value Date   MICROALBUR 1.6 10/27/2014   LDLCALC 69 09/07/2016   CREATININE 1.09 03/18/2017     OTHER active problems discussed today: See review of systems   Allergies as of 03/21/2017   No Known Allergies     Medication List  Accurate as of 03/21/17  8:19 PM. Always use your most recent med list.          acetaminophen 500 MG tablet Commonly known as:  TYLENOL Take 2 tablets (1,000 mg total) by mouth every 6 (six) hours.   AMPYRA 10 MG Tb12 Generic drug:  dalfampridine Take 10 mg by mouth 2 (two) times daily. Take one tablet (10 mg) by mouth every morning and mid afternoon   CONTOUR NEXT TEST test strip Generic drug:  glucose blood USE AS INSTRUCTED TO CHECK BLOOD SUGARS 8 TIMES PER DAY DX CODE E10.65   insulin pump Soln Inject into the skin continuous. Novolog   levothyroxine 150 MCG tablet Commonly known as:  SYNTHROID, LEVOTHROID Take 1 tablet (150 mcg total) by mouth daily. Also take extra half tablet weekly.   lisinopril 10 MG tablet Commonly known as:  PRINIVIL,ZESTRIL TAKE 1 TABLET BY MOUTH EVERY DAY   NOVOLOG 100 UNIT/ML injection Generic  drug:  insulin aspart Inject 80 Units into the skin as directed. Use until blood sugar is back regulated   insulin aspart 100 UNIT/ML injection Commonly known as:  NOVOLOG Use up to 80 units in insulin pump daily-Dx code E10.8   simvastatin 20 MG tablet Commonly known as:  ZOCOR TAKE 1 TABLET BY MOUTH AT BEDTIME       Allergies: No Known Allergies  Past Medical History:  Diagnosis Date  . Hyperlipidemia   . Hypertension   . Hypogonadism in male 11/01/2016  . Hypothyroidism   . Multiple sclerosis ([place])   . 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 Type 1 diabetes mellitus ([place]) dx'd 1994  . Vitamin D insufficiency 11/29/2016    Past Surgical History:  Procedure Laterality Date  . EYE SURGERY Bilateral    "laser for diabetic retinopathy"  . FRACTURE SURGERY    . OPEN REDUCTION INTERNAL FIXATION (ORIF) TIBIA/FIBULA FRACTURE Right 10/30/2016  . ORIF ANKLE FRACTURE Left 2015  . ORIF TIBIA FRACTURE Right 10/30/2016   Procedure: OPEN REDUCTION INTERNAL FIXATION (ORIF) TIBIA FIBULA FRACTURE;  Surgeon: Altamese [place], MD;  Location: [facility];  Service: Orthopedics;  Laterality: Right;  . RETINAL LASER PROCEDURE Bilateral    "for diabetic retinopathy"    Family History  Problem Relation Age of Onset  . Diabetes Sister   . Colon cancer Maternal Grandmother   . Colon polyps Maternal Uncle     Social History:  reports that he has never smoked. He has never used smokeless tobacco. He reports that he does not drink alcohol or use drugs.    Review of Systems      HYPERCHOLESTEROLEMIA: He has been taking simvastatin 20 mg  Liver functions are normal consistently With this   Lab Results  Component Value Date   CHOL 116 09/07/2016   HDL 38.70 (L) 09/07/2016   LDLCALC 69 09/07/2016   LDLDIRECT 139.7 04/09/2014   TRIG 42.0 09/07/2016   CHOLHDL 3 09/07/2016   Lab Results  Component Value Date   ALT 25 11/03/2016    HYPERTENSION:  has  been on lisinopril with  good  control  He has MULTIPLE sclerosis, Followed in [place] by neurologist  HYPOTHYROIDISM: Currently on 150 g, 8 tablets weekly  His thyroid requirement has changed periodically and has been Needing generally higher doses this year  Even with adding another tablet once a week he is still having a high TSH although not having any symptoms of unusual fatigue   Lab Results  Component Value Date  TSH 0.78 03/18/2017   TSH 9.07 (H) 12/06/2016   TSH 3.653 10/31/2016   FREET4 1.16 03/18/2017   FREET4 0.90 08/03/2015   FREET4 0.79 10/27/2014        Objective:   Physical Exam  BP 134/80   Pulse 83   Ht 6\' 1"  (1.854 m)   Wt 211 lb 9.6 oz (96 kg)   SpO2 98%   BMI 27.92 kg/m          Assessment:      DIABETES type I on insulin pump:  See history of present illness for analysis of his current blood sugar patterns and problems  He has had difficulty getting consistent control over the last couple of months because of his ankle fracture and higher readings after the surgery On his own he had tried to use a temporary basal to improve his blood sugar control but information from about a month ago is not available today He is however not aware of needing to take boluses consistently even with changing basal rates He is still not bolusing consistently and especially in the morning has marked hyperglycemia postprandially with unbalanced meals HIGHEST blood sugars are in the prelunch.  Starting to go up at about 10 AM Blood sugars are variable after evening meal but has a tendency to relatively high readings at bedtime and these taking extra boluses May have HYPOGLYCEMIA early morning also, relatively mild  Discussed day-to-day management with insulin pump with adjustment of boluses and basal rates and better compliance with carbohydrate intake, mealtime boluses and balanced meals with working especially in the morning   HYPOTHYROIDISM:  His thyroid levels have been markedly  variable again, not clear if this is due to  medications he has used for MS His dosage appears to have plateaued now  More recently has had low normal TSH although from his neurologist TSH early this month was low Subjectively doing well without symptoms of hyperthyroidism  HYPERTENSION: His blood pressure is well controlled      Plan:      Continue freestyle Libre sensor But he will need to check with the company about technical issues He will also try to check into the DexCom sensor as an alternative, Discussed how this works and given him information and contact numbers  Encouraged him to start eating healthy meals and avoid low fat meals such as biscuits and fried food Also needs to avoid regular soft drinks or juices  He will need to at least have his continuous glucose monitoring recording the for a week before his next visit for reassessment of his blood sugar patterns and insulin regimen Consider follow-up with diabetes educator or dietitian CARBOHYDRATE coverage 1:7 all day  Modifications for basal rate given to patient: Basal rate at 11 AM = 1.7 and from 12 noon until 3 PM = 1.6   THYROID: He will need to reduce his dosage down by half a tablet weekly since TSH is low normal  Follow-up in 6 weeks for reassessment  Influenza vaccine given   Patient Instructions  Take extra 1/2 pill once weekly   Counseling time on subjects discussed in assessment and plan sections is over 50% of today's 25 minute visit    Faaris Arizpe

## 2017-03-20 NOTE — Patient Instructions (Signed)
Calf Stretch    With towel around forefoot, keep knee straight and pull back on towel until a stretch is felt in the calf. Hold __60__ seconds. Pull to the outside (to stretch the inside) hold 60 seconds.  Pull to the inside (to stretch the outside) hold 60 seconds.  Repeat __3__ times. Do __2__ sessions per day.  FLEXION: Supine (Active)    Lie on back with right toes pointed downward and red band over the top of your foot. Bend ankle up, bringing toes toward head. Move straight up and down 12 times.  Move in a V shape 12 times. Complete __3_ sets of _12__ repetitions. Perform __2_ sessions per day.  Copyright  VHI. All rights reserved.    Copyright  VHI. All rights reserved.

## 2017-03-20 NOTE — Therapy (Signed)
[facility] [address] [place], [place], [address] Phone: [phone]   Fax:  [phone]  Physical Therapy Treatment  Patient Details  Name: Johnathan Ross MRN: [id number] Date of Birth: 01-06-1971 Referring Provider: Altamese [place], MD  Encounter Date: 03/20/2017      PT End of Session - 03/20/17 1440    Visit Number 21  G-code due visit number 28   Number of Visits 30  per recertification   Date for PT Re-Evaluation 04/23/17   Authorization Type Kx modifier at visit 15, G-codes and PN every 10th visit   PT Start Time 0840   PT Stop Time 0930   PT Time Calculation (min) 50 min   Activity Tolerance Patient tolerated treatment well   Behavior During Therapy [facility] for tasks assessed/performed      Past Medical History:  Diagnosis Date  . Hyperlipidemia   . Hypertension   . Hypogonadism in male 11/01/2016  . Hypothyroidism   . Multiple sclerosis ([place])   . 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 Type 1 diabetes mellitus ([place]) dx'd 1994  . Vitamin D insufficiency 11/29/2016    Past Surgical History:  Procedure Laterality Date  . EYE SURGERY Bilateral    "laser for diabetic retinopathy"  . FRACTURE SURGERY    . OPEN REDUCTION INTERNAL FIXATION (ORIF) TIBIA/FIBULA FRACTURE Right 10/30/2016  . ORIF ANKLE FRACTURE Left 2015  . ORIF TIBIA FRACTURE Right 10/30/2016   Procedure: OPEN REDUCTION INTERNAL FIXATION (ORIF) TIBIA FIBULA FRACTURE;  Surgeon: Altamese [place], MD;  Location: [facility];  Service: Orthopedics;  Laterality: Right;  . RETINAL LASER PROCEDURE Bilateral    "for diabetic retinopathy"    There were no vitals filed for this visit.      Subjective Assessment - 03/20/17 0845    Subjective Feeling very groggy this morning; slept fine but feels like he hasn't woken up yet.  No soreness after exercises last session   Patient is accompained by: Family member   Pertinent History MS dx about 6 years  ago   How long can you sit comfortably? unlimited   How long can you stand comfortably? unlimited   How long can you walk comfortably? household distances due to LE fatigue/weakness   Diagnostic tests X-ray   Patient Stated Goals Walk without AD   Currently in Pain? No/denies            [facility] PT Assessment - 03/20/17 0846      PROM   Right Ankle Dorsiflexion 98   Right Ankle Plantar Flexion 30   Right Ankle Inversion 18   Right Ankle Eversion 10     Strength   Right Hip Flexion 4-/5   Right Knee Flexion 4-/5   Right Knee Extension 4/5   Right Ankle Dorsiflexion 3-/5   Right Ankle Plantar Flexion 2+/5     Ambulation/Gait   Ambulation/Gait Yes   Assistive device Straight cane     Standardized Balance Assessment   Standardized Balance Assessment Berg Balance Test;Five Times Sit to Stand;10 meter walk test   Five times sit to stand comments  11.84 seconds with UE support from mat   10 Meter Walk 24.31 seconds or 1.34 ft/sec  one standing rest break     [employer]   Sit to Stand Able to stand without using hands and stabilize independently   Standing Unsupported Able to stand safely 2 minutes   Sitting with Back Unsupported but Feet Supported on Floor or Stool  Able to sit safely and securely 2 minutes   Stand to Sit Sits safely with minimal use of hands   Transfers Able to transfer safely, definite need of hands   Standing Unsupported with Eyes Closed Able to stand 10 seconds with supervision   Standing Ubsupported with Feet Together Able to place feet together independently and stand for 1 minute with supervision   From Standing, Reach Forward with Outstretched Arm Can reach confidently >25 cm (10")   From Standing Position, Pick up Object from [place] to pick up shoe, needs supervision   From Standing Position, Turn to Look Behind Over each Shoulder Looks behind from both sides and weight shifts well   Turn 360 Degrees Needs assistance while turning   Standing  Unsupported, Alternately Place Feet on Step/Stool Able to complete >2 steps/needs minimal assist   Standing Unsupported, One Foot in Front Able to take small step independently and hold 30 seconds   Standing on One Leg Able to lift leg independently and hold 5-10 seconds   Total Score 42   Berg comment: 42/56 significant risk for falls              Calf Stretch    With towel around forefoot, keep knee straight and pull back on towel until a stretch is felt in the calf. Hold __60__ seconds. Pull to the outside (to stretch the inside) hold 60 seconds.  Pull to the inside (to stretch the outside) hold 60 seconds.  Repeat __3__ times. Do __2__ sessions per day.  FLEXION: Supine (Active)    Lie on back with right toes pointed downward and red band over the top of your foot. Bend ankle up, bringing toes toward head. Move straight up and down 12 times.  Move in a V shape 12 times. Complete __3_ sets of _12__ repetitions. Perform __2_ sessions per day.                 PT Education - 03/20/17 1439    Education provided Yes   Education Details progress and goal achievement, stretches/strengthening exercises for HEP   Person(s) Educated Patient;Parent(s)   Methods Explanation;Demonstration;Handout   Comprehension Verbalized understanding;Returned demonstration          PT Short Term Goals - 03/20/17 1440      PT SHORT TERM GOAL #1   Title Pt will perform standing HEP with supervision of family   Time 4   Period Weeks   Status Achieved     PT SHORT TERM GOAL #2   Title Pt will ambulate 250' on level surfaces with cane with supervision   Time 4   Period Weeks   Status On-going     PT SHORT TERM GOAL #3   Title Pt will improve R ankle DF ROM to neutral (0) and increase PF by 10 deg   Baseline 98 deg DF, 30 deg PF   Time 4   Period Weeks   Status Achieved     PT SHORT TERM GOAL #4   Title Pt will demonstrate improved LE strength as indicated by decrease in  five time sit to stand < or = 15 seconds   Baseline 11.84 seconds   Time 4   Period Weeks   Status Achieved     PT SHORT TERM GOAL #5   Title Pt will improve standing balance as indicated by increase in BERG balance score to > or = 42/56   Baseline 42/56   Time 4  Period Weeks   Status Achieved     PT SHORT TERM GOAL #6   Title Pt will improve safety with gait as indicated by increase in gait velocity to >1.5 ft/sec with LRAD   Baseline 1.34 ft/sec; improved but not to goal   Time 4   Period Weeks   Status Partially Met           PT Long Term Goals - 03/20/17 1446      PT LONG TERM GOAL #1   Title Pt will ambulate 300' on a variety of level indoor and outdoor paved surfaces with LRAD and supervision   Baseline 02/22/17: pt continues to need RW for stability, have trialed straight cane with min to mod assist needed   Time 8   Period Weeks   Status Revised   Target Date 04/23/17     PT LONG TERM GOAL #2   Title Pt will improve R ankle ROM to >98 deg ankle DF and >40 deg PF   Baseline reassess at 4 weeks   Time 8   Period Weeks   Status Revised     PT LONG TERM GOAL #3   Title Pt will improve LE strength as indicated by improvement in five time sit to stand to < or = 10 seconds   Time 8   Period Weeks   Status Revised     PT LONG TERM GOAL #5   Title Pt will decrease falls risk as indicated by increase in BERG score to > or = 48/56   Time 8   Period Weeks   Status Revised     PT LONG TERM GOAL #6   Title Pt will demonstrate ability to perform standing balance and strengthening HEP safely MOD I   Time 8   Period Weeks   Status Revised     PT LONG TERM GOAL #7   Title Pt will improve gait velocity to > or = 1.8 ft/sec to indicate decreased risk for falls during gait in community   Baseline 02/22/17: 1.26 ft/sec with RW with 2 standing rest breaks for ~5-6 sec's each   Time 8   Period Weeks   Status Revised               Plan - 03/20/17 1442     Clinical Impression Statement Treatment session focused on assessment of progress towards STG; pt has met 4/6 STG, partially met 1 goal and one goal not assessed today due to time.  Pt is showing improvements in RLE strength, ROM, functional strength, balance and gait velocity with RW.  Provided pt with additional ankle stretching and strengthening exercises.  Will assess gait with cane at next session.   Rehab Potential Good   Clinical Impairments Affecting Rehab Potential MS    PT Frequency 2x / week   PT Duration 8 weeks   PT Treatment/Interventions ADLs/Self Care Home Management;Cryotherapy;Vasopneumatic Device;Taping;Gait training;Stair training;Functional mobility training;Therapeutic activities;Therapeutic exercise;Balance training;Neuromuscular re-education;Passive range of motion;Manual techniques;Patient/family education;Electrical Stimulation;DME Instruction;Orthotic Fit/Training;Energy conservation   PT Next Visit Plan Finish checking STG: gait with cane; make decision about keeping current AFO or new?  continue to address gait decreasing UE support, LE strengthening especially hamstring and hip flexor, stairs, rocker board for ankle ROM and strength- monitor HR and fatigue level   PT Home Exercise Plan Continue with ankle HEP with addition of balance exercises   Consulted and Agree with Plan of Care Patient;Family member/caregiver   Family Member Consulted mom  Patient will benefit from skilled therapeutic intervention in order to improve the following deficits and impairments:  Abnormal gait, Pain, Decreased range of motion, Decreased balance, Decreased mobility, Difficulty walking, Decreased activity tolerance, Decreased strength  Visit Diagnosis: Muscle weakness (generalized)  Unsteadiness on feet  Difficulty in walking, not elsewhere classified  Stiffness of right ankle, not elsewhere classified     Problem List Patient Active Problem List   Diagnosis Date Noted  .  Vitamin D insufficiency 11/29/2016  . Hypogonadism in male 11/01/2016  . Type 1 diabetes mellitus ([place])   . Hypothyroidism   . Hypertension   . Hyperlipidemia   . Closed fracture of right fibula and tibia 10/29/2016  . Closed tibia fracture 10/29/2016  . Closed fracture of right tibial plafond with fibula involvement 10/29/2016  . Abnormal liver function tests 03/06/2013  . RASH AND OTHER NONSPECIFIC SKIN ERUPTION 10/07/2009  . SHOULDER PAIN, LEFT 12/27/2008  . Type I (juvenile type) diabetes mellitus without mention of complication, uncontrolled 12/03/2007  . HYPOTHYROIDISM 11/12/2007  . HYPERCHOLESTEROLEMIA 11/12/2007  . MULTIPLE SCLEROSIS 08/29/2007  . PROLIFERATIVE DIABETIC RETINOPATHY 08/29/2007  . HYPERTENSION 12/25/2006    [name], PT, DPT 03/20/17    2:47 PM    [facility] [address] [place], [place], [address] Phone: [phone]   Fax:  [phone]  Name: ARDITH LEWMAN MRN: [id number] Date of Birth: 1971/03/12

## 2017-03-21 ENCOUNTER — Encounter: Payer: Self-pay | Admitting: Endocrinology

## 2017-03-21 ENCOUNTER — Ambulatory Visit (INDEPENDENT_AMBULATORY_CARE_PROVIDER_SITE_OTHER): Payer: Medicare Other | Admitting: Endocrinology

## 2017-03-21 VITALS — BP 134/80 | HR 83 | Ht 73.0 in | Wt 211.6 lb

## 2017-03-21 DIAGNOSIS — E063 Autoimmune thyroiditis: Secondary | ICD-10-CM | POA: Diagnosis not present

## 2017-03-21 DIAGNOSIS — Z23 Encounter for immunization: Secondary | ICD-10-CM | POA: Diagnosis not present

## 2017-03-21 DIAGNOSIS — I10 Essential (primary) hypertension: Secondary | ICD-10-CM

## 2017-03-21 DIAGNOSIS — E1065 Type 1 diabetes mellitus with hyperglycemia: Secondary | ICD-10-CM | POA: Diagnosis not present

## 2017-03-21 LAB — POCT GLYCOSYLATED HEMOGLOBIN (HGB A1C): Hemoglobin A1C: 7.4

## 2017-03-21 NOTE — Patient Instructions (Signed)
Take extra 1/2 pill once weekly

## 2017-03-25 ENCOUNTER — Ambulatory Visit: Payer: Medicare Other | Admitting: Physical Therapy

## 2017-03-25 DIAGNOSIS — R262 Difficulty in walking, not elsewhere classified: Secondary | ICD-10-CM

## 2017-03-25 DIAGNOSIS — M25671 Stiffness of right ankle, not elsewhere classified: Secondary | ICD-10-CM

## 2017-03-25 DIAGNOSIS — R2681 Unsteadiness on feet: Secondary | ICD-10-CM | POA: Diagnosis not present

## 2017-03-25 DIAGNOSIS — M6281 Muscle weakness (generalized): Secondary | ICD-10-CM

## 2017-03-25 NOTE — Therapy (Signed)
[facility] [address] [place], [place], [address] Phone: [phone]   Fax:  [phone]  Physical Therapy Treatment  Patient Details  Name: Johnathan Ross MRN: [id number] Date of Birth: 1971-05-15 Referring Provider: Altamese [place], MD  Encounter Date: 03/25/2017      PT End of Session - 03/25/17 1302    Visit Number 22  G-code due visit number 28   Number of Visits 30  per recertification   Date for PT Re-Evaluation 04/23/17   Authorization Type Kx modifier at visit 15, G-codes and PN every 10th visit   PT Start Time 0845   PT Stop Time 0930   PT Time Calculation (min) 45 min   Activity Tolerance Patient tolerated treatment well   Behavior During Therapy [facility] for tasks assessed/performed      Past Medical History:  Diagnosis Date  . Hyperlipidemia   . Hypertension   . Hypogonadism in male 11/01/2016  . Hypothyroidism   . Multiple sclerosis ([place])   . 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 Type 1 diabetes mellitus ([place]) dx'd 1994  . Vitamin D insufficiency 11/29/2016    Past Surgical History:  Procedure Laterality Date  . EYE SURGERY Bilateral    "laser for diabetic retinopathy"  . FRACTURE SURGERY    . OPEN REDUCTION INTERNAL FIXATION (ORIF) TIBIA/FIBULA FRACTURE Right 10/30/2016  . ORIF ANKLE FRACTURE Left 2015  . ORIF TIBIA FRACTURE Right 10/30/2016   Procedure: OPEN REDUCTION INTERNAL FIXATION (ORIF) TIBIA FIBULA FRACTURE;  Surgeon: Altamese [place], MD;  Location: [facility];  Service: Orthopedics;  Laterality: Right;  . RETINAL LASER PROCEDURE Bilateral    "for diabetic retinopathy"    There were no vitals filed for this visit.      Subjective Assessment - 03/25/17 0850    Subjective No pain or issues this weekend; exercises going well.  Feels like the leg is dragging more even when wearing AFO.  Is open to starting the Ampyra again now that he is more mobile.   Patient is accompained by:  Family member   Pertinent History MS dx about 6 years ago   How long can you sit comfortably? unlimited   How long can you stand comfortably? unlimited   How long can you walk comfortably? household distances due to LE fatigue/weakness   Diagnostic tests X-ray   Patient Stated Goals Walk without AD                         [facility] Adult PT Treatment/Exercise - 03/25/17 1252      Ambulation/Gait   Ambulation/Gait Yes   Ambulation/Gait Assistance 4: Min assist;3: Mod assist   Ambulation/Gait Assistance Details performed gait training with one UE support beginning in // bars forwards and backwards x 3 reps progressing to use of cane in LUE in bars forwards and backwards x 3 reps with therapist providing manual facilitation at trunk for increased stability.  Also performed cane over ground with mod A for balance, trunk/postural control and verbal cues for correct placement of cane and sequencing.  During final walk pt reporting increased fatigue and increased LOB and R genu recurvatum noted   Ambulation Distance (Feet) 50 Feet   Assistive device Straight cane;Parallel bars   Gait Pattern Step-through pattern;Decreased stride length;Decreased step length - right;Decreased step length - left;Decreased hip/knee flexion - right;Decreased hip/knee flexion - left;Poor foot clearance - right;Decreased stance time - right;Decreased dorsiflexion - right;Decreased weight shift to right;Right  genu recurvatum   Ambulation Surface Level;Indoor     Self-Care   Self-Care Other Self-Care Comments   Other Self-Care Comments  assessed current AFO; has block to prevent foot drop but does not have spring action for DF assist; foot plate only comes to mid foot and brace does not come up calf or tibia to provide knee support/control.  Discussed limitations of current brace with pt and what PT would recommend in a new AFO; will continue to address gait and LE strength and then determine if new AFO is  needed.     Neuro Re-ed    Neuro Re-ed Details  performed gait forwards and backwards in // bars with LUE support on bar and RUE performing reaching up and out to R for increased trunk elongation, excursion and weight shift over RLE during stance     Knee/Hip Exercises: Standing   Hip Abduction Stengthening;Right;2 sets;10 reps;Knee straight   Abduction Limitations closed chain R ABD strengthening with R foot on box and L foot tapping down to floor through hip drops/lifts progressing to stepping L foot forwards and back while RLE performed closed chain ABD                PT Education - 03/25/17 1302    Education provided Yes   Education Details reviewed AFO; gait with cane   Person(s) Educated Patient;Parent(s)   Methods Explanation   Comprehension Verbalized understanding          PT Short Term Goals - 03/25/17 1306      PT SHORT TERM GOAL #1   Title Pt will perform standing HEP with supervision of family   Time 4   Period Weeks   Status Achieved     PT SHORT TERM GOAL #2   Title Pt will ambulate 250' on level surfaces with cane with supervision   Baseline 50' with cane and mod A   Time 4   Period Weeks   Status Not Met     PT SHORT TERM GOAL #3   Title Pt will improve R ankle DF ROM to neutral (0) and increase PF by 10 deg   Baseline 98 deg DF, 30 deg PF   Time 4   Period Weeks   Status Achieved     PT SHORT TERM GOAL #4   Title Pt will demonstrate improved LE strength as indicated by decrease in five time sit to stand < or = 15 seconds   Baseline 11.84 seconds   Time 4   Period Weeks   Status Achieved     PT SHORT TERM GOAL #5   Title Pt will improve standing balance as indicated by increase in BERG balance score to > or = 42/56   Baseline 42/56   Time 4   Period Weeks   Status Achieved     PT SHORT TERM GOAL #6   Title Pt will improve safety with gait as indicated by increase in gait velocity to >1.5 ft/sec with LRAD   Baseline 1.34 ft/sec;  improved but not to goal   Time 4   Period Weeks   Status Partially Met           PT Long Term Goals - 03/20/17 1446      PT LONG TERM GOAL #1   Title Pt will ambulate 300' on a variety of level indoor and outdoor paved surfaces with LRAD and supervision   Baseline 02/22/17: pt continues to need RW for stability, have trialed   straight cane with min to mod assist needed   Time 8   Period Weeks   Status Revised   Target Date 04/23/17     PT LONG TERM GOAL #2   Title Pt will improve R ankle ROM to >98 deg ankle DF and >40 deg PF   Baseline reassess at 4 weeks   Time 8   Period Weeks   Status Revised     PT LONG TERM GOAL #3   Title Pt will improve LE strength as indicated by improvement in five time sit to stand to < or = 10 seconds   Time 8   Period Weeks   Status Revised     PT LONG TERM GOAL #5   Title Pt will decrease falls risk as indicated by increase in BERG score to > or = 48/56   Time 8   Period Weeks   Status Revised     PT LONG TERM GOAL #6   Title Pt will demonstrate ability to perform standing balance and strengthening HEP safely MOD I   Time 8   Period Weeks   Status Revised     PT LONG TERM GOAL #7   Title Pt will improve gait velocity to > or = 1.8 ft/sec to indicate decreased risk for falls during gait in community   Baseline 02/22/17: 1.26 ft/sec with RW with 2 standing rest breaks for ~5-6 sec's each   Time 8   Period Weeks   Status Revised               Plan - 03/25/17 1303    Clinical Impression Statement Treatment session today continued discussion regarding limitations of current AFO and what PT would recommend if pt were to obtain a new AFO.  Also continued to focus on progression of gait training to cane with NMR/strengthening of R proximal hip and trunk activation to improve balance/stability and decrease use of neck/shoulders for stability.  Will continue to progress as pt tolerates.     Rehab Potential Good   Clinical Impairments  Affecting Rehab Potential MS    PT Frequency 2x / week   PT Duration 8 weeks   PT Treatment/Interventions ADLs/Self Care Home Management;Cryotherapy;Vasopneumatic Device;Taping;Gait training;Stair training;Functional mobility training;Therapeutic activities;Therapeutic exercise;Balance training;Neuromuscular re-education;Passive range of motion;Manual techniques;Patient/family education;Electrical Stimulation;DME Instruction;Orthotic Fit/Training;Energy conservation   PT Next Visit Plan WILL HOLD ON OBTAINING NEW AFO FOR NOW; WILL CONTINUE TO STRENGTH RLE AND DECIDE IN A COUPLE OF WEEKS.  continue to address gait decreasing UE support, use of cane but focus on R hip closed chain hip ABD and trunk control training, LE strengthening especially hamstring and hip flexor, stairs, rocker board for ankle ROM and strength- monitor HR and fatigue level   PT Home Exercise Plan Continue with ankle HEP with addition of balance exercises   Consulted and Agree with Plan of Care Patient;Family member/caregiver   Family Member Consulted mom      Patient will benefit from skilled therapeutic intervention in order to improve the following deficits and impairments:  Abnormal gait, Pain, Decreased range of motion, Decreased balance, Decreased mobility, Difficulty walking, Decreased activity tolerance, Decreased strength  Visit Diagnosis: Muscle weakness (generalized)  Unsteadiness on feet  Difficulty in walking, not elsewhere classified  Stiffness of right ankle, not elsewhere classified     Problem List Patient Active Problem List   Diagnosis Date Noted  . Vitamin D insufficiency 11/29/2016  . Hypogonadism in male 11/01/2016  . Type 1 diabetes mellitus ([place])   .  Hypothyroidism   . Hypertension   . Hyperlipidemia   . Closed fracture of right fibula and tibia 10/29/2016  . Closed tibia fracture 10/29/2016  . Closed fracture of right tibial plafond with fibula involvement 10/29/2016  . Abnormal liver  function tests 03/06/2013  . RASH AND OTHER NONSPECIFIC SKIN ERUPTION 10/07/2009  . SHOULDER PAIN, LEFT 12/27/2008  . Type I (juvenile type) diabetes mellitus without mention of complication, uncontrolled 12/03/2007  . HYPOTHYROIDISM 11/12/2007  . HYPERCHOLESTEROLEMIA 11/12/2007  . MULTIPLE SCLEROSIS 08/29/2007  . PROLIFERATIVE DIABETIC RETINOPATHY 08/29/2007  . HYPERTENSION 12/25/2006    [name], PT, DPT 03/25/17    1:08 PM    [facility] [address] [place], [place], [address] Phone: [phone]   Fax:  [phone]  Name: SAIR FAULCON MRN: [id number] Date of Birth: March 21, 1971

## 2017-03-27 ENCOUNTER — Ambulatory Visit: Payer: Medicare Other | Admitting: Physical Therapy

## 2017-03-27 ENCOUNTER — Encounter: Payer: Self-pay | Admitting: Physical Therapy

## 2017-03-27 DIAGNOSIS — M6281 Muscle weakness (generalized): Secondary | ICD-10-CM | POA: Diagnosis not present

## 2017-03-27 DIAGNOSIS — R262 Difficulty in walking, not elsewhere classified: Secondary | ICD-10-CM | POA: Diagnosis not present

## 2017-03-27 DIAGNOSIS — M25671 Stiffness of right ankle, not elsewhere classified: Secondary | ICD-10-CM | POA: Diagnosis not present

## 2017-03-27 DIAGNOSIS — R2681 Unsteadiness on feet: Secondary | ICD-10-CM

## 2017-03-27 NOTE — Therapy (Signed)
[facility] [address] [place], [place], [address] Phone: [phone]   Fax:  [phone]  Physical Therapy Treatment  Patient Details  Name: Johnathan Ross MRN: [id number] Date of Birth: [birth year] Referring Provider: Altamese [place], MD  Encounter Date: 03/27/2017      PT End of Session - 03/27/17 2037    Visit Number 23  G-code due visit number 28   Number of Visits 30  per recertification   Date for PT Re-Evaluation 04/23/17   Authorization Type Kx modifier at visit 15, G-codes and PN every 10th visit   PT Start Time 0848   PT Stop Time 0930   PT Time Calculation (min) 42 min   Activity Tolerance Patient tolerated treatment well   Behavior During Therapy [facility] for tasks assessed/performed      Past Medical History:  Diagnosis Date  . Hyperlipidemia   . Hypertension   . Hypogonadism in male 11/01/2016  . Hypothyroidism   . Multiple sclerosis ([place])   . 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 Type 1 diabetes mellitus ([facility]) dx'd 1994  . Vitamin D insufficiency 11/29/2016    Past Surgical History:  Procedure Laterality Date  . EYE SURGERY Bilateral    "laser for diabetic retinopathy"  . FRACTURE SURGERY    . OPEN REDUCTION INTERNAL FIXATION (ORIF) TIBIA/FIBULA FRACTURE Right 10/30/2016  . ORIF ANKLE FRACTURE Left 2015  . ORIF TIBIA FRACTURE Right 10/30/2016   Procedure: OPEN REDUCTION INTERNAL FIXATION (ORIF) TIBIA FIBULA FRACTURE;  Surgeon: Altamese [place], MD;  Location: [facility];  Service: Orthopedics;  Laterality: Right;  . RETINAL LASER PROCEDURE Bilateral    "for diabetic retinopathy"    There were no vitals filed for this visit.      Subjective Assessment - 03/27/17 0852    Subjective No soreness or pain after Monday's session; ambulating a little faster today.  Less foot drag as noted by pt today.   Patient is accompained by: Family member   Pertinent History MS dx about 6 years ago   How  long can you sit comfortably? unlimited   How long can you stand comfortably? unlimited   How long can you walk comfortably? household distances due to LE fatigue/weakness   Diagnostic tests X-ray   Patient Stated Goals Walk without AD                         [facility] Adult PT Treatment/Exercise - 03/27/17 0859      Ambulation/Gait   Ambulation/Gait Yes   Ambulation/Gait Assistance 4: Min assist;3: Mod assist   Ambulation/Gait Assistance Details L lateral weight shift through trunk and pelvis to allow for full RLE hip and knee flexion for full advancement and clearance   Ambulation Distance (Feet) 115 Feet   Assistive device Straight cane   Gait Pattern Step-through pattern;Decreased stride length;Decreased step length - right;Decreased step length - left;Decreased hip/knee flexion - right;Decreased hip/knee flexion - left;Poor foot clearance - right;Decreased stance time - right;Decreased dorsiflexion - right;Decreased weight shift to right;Right genu recurvatum   Ambulation Surface Level;Indoor     Neuro Re-ed    Neuro Re-ed Details  NMR seated on physioball focus on trunk and postural control with dissociation of pelvic rotation and tilts anterior/posterior/lateral with trunk elongation and shortening decreasing shoulder elevation and trunk lateral flexion with mod A     Manual Therapy   Manual Therapy Soft tissue mobilization   Soft tissue mobilization seated reclined on  therapy ball stretching anterior chest mm while performing trigger point release of upper trap mm             Balance Exercises - 03/27/17 0931      Balance Exercises: Standing   SLS with Vectors Solid surface;Upper extremity assist 2;5 reps  3 sets; RLE standing on 2" block and mirror for feedback           PT Education - 03/27/17 2037    Education provided Yes   Education Details gait with cane   Person(s) Educated Patient   Methods Verbal cues;Tactile cues   Comprehension Need  further instruction          PT Short Term Goals - 03/25/17 1306      PT SHORT TERM GOAL #1   Title Pt will perform standing HEP with supervision of family   Time 4   Period Weeks   Status Achieved     PT SHORT TERM GOAL #2   Title Pt will ambulate 250' on level surfaces with cane with supervision   Baseline 50' with cane and mod A   Time 4   Period Weeks   Status Not Met     PT SHORT TERM GOAL #3   Title Pt will improve R ankle DF ROM to neutral (0) and increase PF by 10 deg   Baseline 98 deg DF, 30 deg PF   Time 4   Period Weeks   Status Achieved     PT SHORT TERM GOAL #4   Title Pt will demonstrate improved LE strength as indicated by decrease in five time sit to stand < or = 15 seconds   Baseline 11.84 seconds   Time 4   Period Weeks   Status Achieved     PT SHORT TERM GOAL #5   Title Pt will improve standing balance as indicated by increase in BERG balance score to > or = 42/56   Baseline 42/56   Time 4   Period Weeks   Status Achieved     PT SHORT TERM GOAL #6   Title Pt will improve safety with gait as indicated by increase in gait velocity to >1.5 ft/sec with LRAD   Baseline 1.34 ft/sec; improved but not to goal   Time 4   Period Weeks   Status Partially Met           PT Long Term Goals - 03/20/17 1446      PT LONG TERM GOAL #1   Title Pt will ambulate 300' on a variety of level indoor and outdoor paved surfaces with LRAD and supervision   Baseline 02/22/17: pt continues to need RW for stability, have trialed straight cane with min to mod assist needed   Time 8   Period Weeks   Status Revised   Target Date 04/23/17     PT LONG TERM GOAL #2   Title Pt will improve R ankle ROM to >98 deg ankle DF and >40 deg PF   Baseline reassess at 4 weeks   Time 8   Period Weeks   Status Revised     PT LONG TERM GOAL #3   Title Pt will improve LE strength as indicated by improvement in five time sit to stand to < or = 10 seconds   Time 8   Period Weeks    Status Revised     PT LONG TERM GOAL #5   Title Pt will decrease falls risk as indicated by  increase in BERG score to > or = 48/56   Time 8   Period Weeks   Status Revised     PT LONG TERM GOAL #6   Title Pt will demonstrate ability to perform standing balance and strengthening HEP safely MOD I   Time 8   Period Weeks   Status Revised     PT LONG TERM GOAL #7   Title Pt will improve gait velocity to > or = 1.8 ft/sec to indicate decreased risk for falls during gait in community   Baseline 02/22/17: 1.26 ft/sec with RW with 2 standing rest breaks for ~5-6 sec's each   Time 8   Period Weeks   Status Revised               Plan - 03/27/17 2038    Clinical Impression Statement Continued to focus on gait with cane with pt progressing to >100' with min A overall and 3-4 standing rest breaks with improved trunk control and foot clearance.  Performed trunk and postural control training seated on physioball followed by upper thoracic and anterior chest stretches and soft tissue work on upper trapezius mm due to overuse and increased tension.  Continued to focus on proximal hip stability with single limb stance with vectors.  Pt tolerated well today with minimal fatigue.  Will continue to progress as pt is able to tolerate.   Rehab Potential Good   Clinical Impairments Affecting Rehab Potential MS    PT Frequency 2x / week   PT Duration 8 weeks   PT Treatment/Interventions ADLs/Self Care Home Management;Cryotherapy;Vasopneumatic Device;Taping;Gait training;Stair training;Functional mobility training;Therapeutic activities;Therapeutic exercise;Balance training;Neuromuscular re-education;Passive range of motion;Manual techniques;Patient/family education;Electrical Stimulation;DME Instruction;Orthotic Fit/Training;Energy conservation   PT Next Visit Plan WILL HOLD ON OBTAINING NEW AFO FOR NOW; WILL CONTINUE TO STRENGTH RLE AND DECIDE IN A COUPLE OF WEEKS.  continue to address gait decreasing  UE support, use of cane but focus on R hip closed chain hip ABD and trunk control training, LE strengthening especially hamstring and hip flexor, stairs, single limb stance and proximal hip stability, trunk/postural control, rocker board for ankle ROM and strength- monitor HR and fatigue level   PT Home Exercise Plan Continue with ankle HEP with addition of balance exercises   Consulted and Agree with Plan of Care Patient;Family member/caregiver   Family Member Consulted mom      Patient will benefit from skilled therapeutic intervention in order to improve the following deficits and impairments:  Abnormal gait, Pain, Decreased range of motion, Decreased balance, Decreased mobility, Difficulty walking, Decreased activity tolerance, Decreased strength  Visit Diagnosis: Muscle weakness (generalized)  Unsteadiness on feet  Difficulty in walking, not elsewhere classified  Stiffness of right ankle, not elsewhere classified     Problem List Patient Active Problem List   Diagnosis Date Noted  . Vitamin D insufficiency 11/29/2016  . Hypogonadism in male 11/01/2016  . Type 1 diabetes mellitus ([place])   . Hypothyroidism   . Hypertension   . Hyperlipidemia   . Closed fracture of right fibula and tibia 10/29/2016  . Closed tibia fracture 10/29/2016  . Closed fracture of right tibial plafond with fibula involvement 10/29/2016  . Abnormal liver function tests 03/06/2013  . RASH AND OTHER NONSPECIFIC SKIN ERUPTION 10/07/2009  . SHOULDER PAIN, LEFT 12/27/2008  . Type I (juvenile type) diabetes mellitus without mention of complication, uncontrolled 12/03/2007  . HYPOTHYROIDISM 11/12/2007  . HYPERCHOLESTEROLEMIA 11/12/2007  . MULTIPLE SCLEROSIS 08/29/2007  . PROLIFERATIVE DIABETIC RETINOPATHY 08/29/2007  .  HYPERTENSION 12/25/2006    [name], PT, DPT 03/27/17    8:47 PM    [facility] [address] [place], [place],  [address] Phone: [phone]   Fax:  [phone]  Name: Johnathan Ross MRN: [id number] Date of Birth: [birth year]

## 2017-04-01 ENCOUNTER — Ambulatory Visit: Payer: Medicare Other | Attending: Orthopedic Surgery | Admitting: Physical Therapy

## 2017-04-01 ENCOUNTER — Encounter: Payer: Self-pay | Admitting: Physical Therapy

## 2017-04-01 DIAGNOSIS — M25571 Pain in right ankle and joints of right foot: Secondary | ICD-10-CM | POA: Diagnosis not present

## 2017-04-01 DIAGNOSIS — M25671 Stiffness of right ankle, not elsewhere classified: Secondary | ICD-10-CM

## 2017-04-01 DIAGNOSIS — M6281 Muscle weakness (generalized): Secondary | ICD-10-CM | POA: Diagnosis not present

## 2017-04-01 DIAGNOSIS — R262 Difficulty in walking, not elsewhere classified: Secondary | ICD-10-CM | POA: Diagnosis not present

## 2017-04-01 DIAGNOSIS — R2681 Unsteadiness on feet: Secondary | ICD-10-CM | POA: Insufficient documentation

## 2017-04-01 NOTE — Therapy (Signed)
[facility] [address] [place], [place], [address] Phone: [phone]   Fax:  [phone]  Physical Therapy Treatment  Patient Details  Name: Johnathan Ross MRN: [id number] Date of Birth: Jul 14, 1970 Referring Provider: Altamese [place], MD   Encounter Date: 04/01/2017  PT End of Session - 04/01/17 0939    Visit Number  24 G-code due visit number 28   G-code due visit number 28   Number of Visits  30 per recertification   per recertification   Date for PT Re-Evaluation  04/23/17    Authorization Type  Kx modifier at visit 15, G-codes and PN every 10th visit    PT Start Time  0933    PT Stop Time  1016    PT Time Calculation (min)  43 min    Equipment Utilized During Treatment  Gait belt    Activity Tolerance  Patient tolerated treatment well    Behavior During Therapy  [facility] for tasks assessed/performed       Past Medical History:  Diagnosis Date  . Hyperlipidemia   . Hypertension   . Hypogonadism in male 11/01/2016  . Hypothyroidism   . Multiple sclerosis ([place])   . 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 Type 1 diabetes mellitus ([place]) dx'd 1994  . Vitamin D insufficiency 11/29/2016    Past Surgical History:  Procedure Laterality Date  . EYE SURGERY Bilateral    "laser for diabetic retinopathy"  . FRACTURE SURGERY    . OPEN REDUCTION INTERNAL FIXATION (ORIF) TIBIA/FIBULA FRACTURE Right 10/30/2016  . ORIF ANKLE FRACTURE Left 2015  . RETINAL LASER PROCEDURE Bilateral    "for diabetic retinopathy"    There were no vitals filed for this visit.  Subjective Assessment - 04/01/17 0938    Subjective  No new complaints, no falls or pain to report.     Patient is accompained by:  Family member    Pertinent History  MS dx about 6 years ago    How long can you sit comfortably?  unlimited    How long can you stand comfortably?  unlimited    How long can you walk comfortably?  household distances due to LE  fatigue/weakness    Diagnostic tests  X-ray    Patient Stated Goals  Walk without AD    Currently in Pain?  No/denies        [facility] Adult PT Treatment/Exercise - 04/01/17 0001      Neuro Re-ed    Neuro Re-ed Details   Pec stretch with pt seated on mat/ therapist behind to stabilize shoulders and provide stretch. Pt instructed to demonstrate proper posture/pull shoulder back/depress shoulders while therapist pulls posterior at shoulders, x5 reps 30 sec holds.         Upper Trap stretch with pt seated on mat, pt instructed to grab mat with one UE/then laterally lean head and trunk in other direction x5 reps 30 sec holds. Pt required cues on taking deep breaths to relax during stretch and release tension.       Knee/Hip Exercises: Standing   Hip Abduction  AROM;Stengthening;Both;10 reps;Knee straight;Limitations    Abduction Limitations  Pt standing at counter with BUE support, pt required cues for posture, to avoid lateral lean in assisting with exercise and to refrain from holding his breath.      Hip Extension  AROM;Stengthening;Both;10 reps;Knee straight;Limitations    Extension Limitations  Pt standing at counter with BUE support, cues required for posture, to avoid forward  lean and to clear toes off the floor with each rep.     SLS  Pt standing at counter with BUE support, cues to breathe and being LE higher off ground.           Balance Exercises - 04/01/17 1028      Balance Exercises: Standing   Marching Limitations  Pt seated on blue physioball with mat behind, pt slowly marching each LE in an alt. pattern, pt able to clear foot off of floor but presented with a lateral lean with each rep, pt completed x5 reps each LE. cues for posture and to decrease lean.       Balance Exercises: Seated   Other Seated Exercises  Pt seated on blue physioball performing pelvic ant/post/lateral pelvic tilits, pt performed this task well with only cue for posture required.     Other Seated  Exercises Comments  Pt seated on blue physioball with mat behind, performing alt UE raises x10 reps, pt performed this task well with minimal sway, Cues for posture and breathing properly.       Balance Exercises: Standing   Standing Eyes Opened Limitations  Pt standing on airex in corner with chair in front for UE support as needed. Pt performing head movements with EO, head turns/head nods/diagonals both ways. Pt min to mod assist with this task, Pt presented with posterior and left lateral lean during head movements with some loss of balance. Pt showed most difficulty with up to left diagonals.           PT Short Term Goals - 04/01/17 0939      PT SHORT TERM GOAL #1   Title  Pt will perform standing HEP with supervision of family    Time  4    Period  Weeks    Status  Achieved      PT SHORT TERM GOAL #2   Title  Pt will ambulate 250' on level surfaces with cane with supervision    Baseline  50' with cane and mod A    Time  4    Period  Weeks    Status  Not Met      PT SHORT TERM GOAL #3   Title  Pt will improve R ankle DF ROM to neutral (0) and increase PF by 10 deg    Baseline  98 deg DF, 30 deg PF    Time  4    Period  Weeks    Status  Achieved      PT SHORT TERM GOAL #4   Title  Pt will demonstrate improved LE strength as indicated by decrease in five time sit to stand < or = 15 seconds    Baseline  11.84 seconds    Time  4    Period  Weeks    Status  Achieved      PT SHORT TERM GOAL #5   Title  Pt will improve standing balance as indicated by increase in BERG balance score to > or = 42/56    Baseline  42/56    Time  4    Period  Weeks    Status  Achieved      PT SHORT TERM GOAL #6   Title  Pt will improve safety with gait as indicated by increase in gait velocity to >1.5 ft/sec with LRAD    Baseline  1.34 ft/sec; improved but not to goal    Time  4    Period  Weeks    Status  Partially Met        PT Long Term Goals - 04/01/17 0939      PT LONG TERM  GOAL #1   Title  Pt will ambulate 300' on a variety of level indoor and outdoor paved surfaces with LRAD and supervision    Baseline  02/22/17: pt continues to need RW for stability, have trialed straight cane with min to mod assist needed    Time  8    Period  Weeks    Status  Revised      PT LONG TERM GOAL #2   Title  Pt will improve R ankle ROM to >98 deg ankle DF and >40 deg PF    Baseline  reassess at 4 weeks    Time  8    Period  Weeks    Status  Revised      PT LONG TERM GOAL #3   Title  Pt will improve LE strength as indicated by improvement in five time sit to stand to < or = 10 seconds    Time  8    Period  Weeks    Status  Revised      PT LONG TERM GOAL #5   Title  Pt will decrease falls risk as indicated by increase in BERG score to > or = 48/56    Time  8    Period  Weeks    Status  Revised      PT LONG TERM GOAL #6   Title  Pt will demonstrate ability to perform standing balance and strengthening HEP safely MOD I    Time  8    Period  Weeks    Status  Revised      PT LONG TERM GOAL #7   Title  Pt will improve gait velocity to > or = 1.8 ft/sec to indicate decreased risk for falls during gait in community    Baseline  02/22/17: 1.26 ft/sec with RW with 2 standing rest breaks for ~5-6 sec's each    Time  8    Period  Weeks    Status  Revised            Plan - 04/01/17 1053    Clinical Impression Statement  Pt tolerated treatment well with no limitation to pain but some limitation due to fatigue. Pt arrived walking with cane today and was used through out treatment. Todays session focused on LE strengthening, balance, core strengthening, and stretching of chest/neck area. Pt will should benefit from continued PT sessions.     Rehab Potential  Good    Clinical Impairments Affecting Rehab Potential  MS     PT Frequency  2x / week    PT Duration  8 weeks    PT Treatment/Interventions  ADLs/Self Care Home Management;Cryotherapy;Vasopneumatic Device;Taping;Gait  training;Stair training;Functional mobility training;Therapeutic activities;Therapeutic exercise;Balance training;Neuromuscular re-education;Passive range of motion;Manual techniques;Patient/family education;Electrical Stimulation;DME Instruction;Orthotic Fit/Training;Energy conservation    PT Next Visit Plan  WILL HOLD ON OBTAINING NEW AFO FOR NOW; WILL CONTINUE TO STRENGTH RLE AND DECIDE IN A COUPLE OF WEEKS.  continue to address gait decreasing UE support, use of cane but focus on R hip closed chain hip ABD and trunk control training, LE strengthening especially hamstring and hip flexor, stairs, single limb stance and proximal hip stability, trunk/postural control, rocker board for ankle ROM and strength- monitor HR and fatigue level    PT Home Exercise Plan  Continue with LE  strengthening and balance exercises    Consulted and Agree with Plan of Care  Patient;Family member/caregiver    Family Member Consulted  mom       Patient will benefit from skilled therapeutic intervention in order to improve the following deficits and impairments:  Abnormal gait, Pain, Decreased range of motion, Decreased balance, Decreased mobility, Difficulty walking, Decreased activity tolerance, Decreased strength  Visit Diagnosis: Difficulty in walking, not elsewhere classified  Stiffness of right ankle, not elsewhere classified  Unsteadiness on feet  Muscle weakness (generalized)     Problem List Patient Active Problem List   Diagnosis Date Noted  . Vitamin D insufficiency 11/29/2016  . Hypogonadism in male 11/01/2016  . Type 1 diabetes mellitus ([place])   . Hypothyroidism   . Hypertension   . Hyperlipidemia   . Closed fracture of right fibula and tibia 10/29/2016  . Closed tibia fracture 10/29/2016  . Closed fracture of right tibial plafond with fibula involvement 10/29/2016  . Abnormal liver function tests 03/06/2013  . RASH AND OTHER NONSPECIFIC SKIN ERUPTION 10/07/2009  . SHOULDER PAIN, LEFT  12/27/2008  . Type I (juvenile type) diabetes mellitus without mention of complication, uncontrolled 12/03/2007  . HYPOTHYROIDISM 11/12/2007  . HYPERCHOLESTEROLEMIA 11/12/2007  . MULTIPLE SCLEROSIS 08/29/2007  . PROLIFERATIVE DIABETIC RETINOPATHY 08/29/2007  . HYPERTENSION 12/25/2006   Johnathan Ross, SPTA  Quavis Klutz 04/01/2017, 10:57 AM  [facility] [address] [place], [place], [address] Phone: [phone]   Fax:  [phone]  Name: KEDAR SEDANO MRN: [id number] Date of Birth: 1970-10-07

## 2017-04-04 ENCOUNTER — Encounter: Payer: Self-pay | Admitting: Physical Therapy

## 2017-04-04 ENCOUNTER — Ambulatory Visit: Payer: Medicare Other | Admitting: Physical Therapy

## 2017-04-04 DIAGNOSIS — R2681 Unsteadiness on feet: Secondary | ICD-10-CM | POA: Diagnosis not present

## 2017-04-04 DIAGNOSIS — M6281 Muscle weakness (generalized): Secondary | ICD-10-CM | POA: Diagnosis not present

## 2017-04-04 DIAGNOSIS — M25571 Pain in right ankle and joints of right foot: Secondary | ICD-10-CM | POA: Diagnosis not present

## 2017-04-04 DIAGNOSIS — M25671 Stiffness of right ankle, not elsewhere classified: Secondary | ICD-10-CM | POA: Diagnosis not present

## 2017-04-04 DIAGNOSIS — R262 Difficulty in walking, not elsewhere classified: Secondary | ICD-10-CM | POA: Diagnosis not present

## 2017-04-04 NOTE — Therapy (Signed)
[facility] [address] [place], [place], [address] Phone: [phone]   Fax:  [phone]  Physical Therapy Treatment  Patient Details  Name: Johnathan Ross MRN: [id number] Date of Birth: 12-03-1970 Referring Provider: Altamese Elgin, MD   Encounter Date: 04/04/2017  PT End of Session - 04/04/17 0849    Visit Number  23 G-code due visit number 28   G-code due visit number 28   Number of Visits  30 per recertification   per recertification   Date for PT Re-Evaluation  04/23/17    Authorization Type  Kx modifier at visit 15, G-codes and PN every 10th visit    PT Start Time  0845    PT Stop Time  0930    PT Time Calculation (min)  45 min    Equipment Utilized During Treatment  Gait belt    Activity Tolerance  Patient tolerated treatment well    Behavior During Therapy  [facility] for tasks assessed/performed       Past Medical History:  Diagnosis Date  . Hyperlipidemia   . Hypertension   . Hypogonadism in male 11/01/2016  . Hypothyroidism   . Multiple sclerosis ([place])   . 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 Type 1 diabetes mellitus ([place]) dx'd 1994  . Vitamin D insufficiency 11/29/2016    Past Surgical History:  Procedure Laterality Date  . EYE SURGERY Bilateral    "laser for diabetic retinopathy"  . FRACTURE SURGERY    . OPEN REDUCTION INTERNAL FIXATION (ORIF) TIBIA/FIBULA FRACTURE Right 10/30/2016  . ORIF ANKLE FRACTURE Left 2015  . RETINAL LASER PROCEDURE Bilateral    "for diabetic retinopathy"    There were no vitals filed for this visit.  Subjective Assessment - 04/04/17 0848    Subjective  No new complaints, no falls or pain to report. Pt reports having some fatigue today and ariived using the RW.     Patient is accompained by:  Family member    Pertinent History  MS dx about 6 years ago    How long can you sit comfortably?  unlimited    How long can you stand comfortably?  unlimited    How  long can you walk comfortably?  household distances due to LE fatigue/weakness    Diagnostic tests  X-ray    Patient Stated Goals  Walk without AD    Currently in Pain?  No/denies       [facility] Adult PT Treatment/Exercise - 04/04/17 0001      Ambulation/Gait   Ambulation/Gait  Yes    Ambulation/Gait Assistance  4: Min guard;4: Min assist    Ambulation/Gait Assistance Details  Pt min gaurd/min assist with gait, cues for heel strike/toe off to avoid toe catching on the RLE. Pt presesnted with poor foot clearance after LE strengthening.     Ambulation Distance (Feet)  172 Feet 115 seated rest break, 28 seated rest break,  28   115 seated rest break, 28 seated rest break,  28   Assistive device  Rolling walker    Gait Pattern  Step-through pattern;Poor foot clearance - right;Decreased dorsiflexion - right;Decreased hip/knee flexion - right;Decreased hip/knee flexion - left    Ambulation Surface  Level;Indoor    Stairs  Yes    Stairs Assistance  4: Min guard;4: Min assist    Stairs Assistance Details (indicate cue type and reason)  cues to being knee higher to clear feet with ascending/desending    Stair Management Technique  Two  rails;Alternating pattern;Forwards    Number of Stairs  4 x2 reps   x2 reps   Height of Stairs  6      Knee/Hip Exercises: Standing   Hip Abduction  AROM;Stengthening;Both;10 reps    Abduction Limitations  Standing with BUE support, cues to avoid lateral lean and breathe.    Other Standing Knee Exercises  Pt at paralell bars performing mini squats with no UE support required, min assist. Pt performed x5 reps/2 sets with a standing rest breaking between sets.     Other Standing Knee Exercises  Pt performed 5x STS from mat with no UE support and tolerated this well.       Knee/Hip Exercises: Seated   Long Arc Quad  AROM;Strengthening;Both;10 reps;2 sets;Weights;Limitations    Long Arc Quad Weight  3 lbs.    Long Arc Quad Limitations  Pt seated on edge of mat,  required rest break between reps. Cues to avoid compensation with upper body, pt instructed to place hands in lap.     Marching  AROM;Strengthening;Both;2 sets;10 reps;Weights;Limitations    Marching Limitations  Pt seated on edge of mat, therapist behind to stabilize shoulders/upper body to avoid compenstation. cues to breathe. Pt required a rest break between sets.     Marching Weights  3 lbs.      Knee/Hip Exercises: Supine   Straight Leg Raises  AAROM;Both;5 reps;2 sets;Limitations    Straight Leg Raises Limitations  Pt lying on mat, AAROM with LLE, pt was only able to lift LE's about 6" from the mat. Pt required cues to breathe and required a rest break between sets.     Other Supine Knee/Hip Exercises  Pt on mat performing hamstring curls with red physioball under feet. Pt had some trouble stabilizing his feet on the ball and demonstrated some fatigue with this task. Pt required cues to breathe and for a slow/controlled descent.  Pt performed 10 reps/ 2 sets with a rest break between each set.        PT Short Term Goals - 04/04/17 0849      PT SHORT TERM GOAL #1   Title  Pt will perform standing HEP with supervision of family    Time  4    Period  Weeks    Status  Achieved      PT SHORT TERM GOAL #2   Title  Pt will ambulate 250' on level surfaces with cane with supervision    Baseline  50' with cane and mod A    Time  4    Period  Weeks    Status  Not Met      PT SHORT TERM GOAL #3   Title  Pt will improve R ankle DF ROM to neutral (0) and increase PF by 10 deg    Baseline  98 deg DF, 30 deg PF    Time  4    Period  Weeks    Status  Achieved      PT SHORT TERM GOAL #4   Title  Pt will demonstrate improved LE strength as indicated by decrease in five time sit to stand < or = 15 seconds    Baseline  11.84 seconds    Time  4    Period  Weeks    Status  Achieved      PT SHORT TERM GOAL #5   Title  Pt will improve standing balance as indicated by increase in BERG balance  score to > or =  42/56    Baseline  42/56    Time  4    Period  Weeks    Status  Achieved      PT SHORT TERM GOAL #6   Title  Pt will improve safety with gait as indicated by increase in gait velocity to >1.5 ft/sec with LRAD    Baseline  1.34 ft/sec; improved but not to goal    Time  4    Period  Weeks    Status  Partially Met        PT Long Term Goals - 04/04/17 0849      PT LONG TERM GOAL #1   Title  Pt will ambulate 300' on a variety of level indoor and outdoor paved surfaces with LRAD and supervision    Baseline  02/22/17: pt continues to need RW for stability, have trialed straight cane with min to mod assist needed    Time  8    Period  Weeks    Status  Revised      PT LONG TERM GOAL #2   Title  Pt will improve R ankle ROM to >98 deg ankle DF and >40 deg PF    Baseline  reassess at 4 weeks    Time  8    Period  Weeks    Status  Revised      PT LONG TERM GOAL #3   Title  Pt will improve LE strength as indicated by improvement in five time sit to stand to < or = 10 seconds    Time  8    Period  Weeks    Status  Revised      PT LONG TERM GOAL #5   Title  Pt will decrease falls risk as indicated by increase in BERG score to > or = 48/56    Time  8    Period  Weeks    Status  Revised      PT LONG TERM GOAL #6   Title  Pt will demonstrate ability to perform standing balance and strengthening HEP safely MOD I    Time  8    Period  Weeks    Status  Revised      PT LONG TERM GOAL #7   Title  Pt will improve gait velocity to > or = 1.8 ft/sec to indicate decreased risk for falls during gait in community    Baseline  02/22/17: 1.26 ft/sec with RW with 2 standing rest breaks for ~5-6 sec's each    Time  8    Period  Weeks    Status  Revised            Plan - 04/04/17 1000    Clinical Impression Statement  Pt tolerated treatment well with no limitations to pain but some limitation to fatigue requiring moderate seated/standing rest breaks during activity. Todays  session focused on LE/core strengthening and gait training. Pt should benefit from continued PT sessions.     Rehab Potential  Good    Clinical Impairments Affecting Rehab Potential  MS     PT Frequency  2x / week    PT Duration  8 weeks    PT Treatment/Interventions  ADLs/Self Care Home Management;Cryotherapy;Vasopneumatic Device;Taping;Gait training;Stair training;Functional mobility training;Therapeutic activities;Therapeutic exercise;Balance training;Neuromuscular re-education;Passive range of motion;Manual techniques;Patient/family education;Electrical Stimulation;DME Instruction;Orthotic Fit/Training;Energy conservation    PT Next Visit Plan  WILL HOLD ON OBTAINING NEW AFO FOR NOW; WILL CONTINUE TO STRENGTH RLE AND DECIDE IN A  COUPLE OF WEEKS.  continue to address gait decreasing UE support, use of cane but focus on R hip closed chain hip ABD and trunk control training, LE strengthening especially hamstring and hip flexor, single limb stance and proximal hip stability, trunk/postural control, rocker board for ankle ROM and strength- monitor HR and fatigue level    PT Home Exercise Plan  Continue with LE strengthening and balance exercises    Consulted and Agree with Plan of Care  Patient;Family member/caregiver    Family Member Consulted  mom       Patient will benefit from skilled therapeutic intervention in order to improve the following deficits and impairments:  Abnormal gait, Pain, Decreased range of motion, Decreased balance, Decreased mobility, Difficulty walking, Decreased activity tolerance, Decreased strength  Visit Diagnosis: Difficulty in walking, not elsewhere classified  Stiffness of right ankle, not elsewhere classified  Unsteadiness on feet  Muscle weakness (generalized)     Problem List Patient Active Problem List   Diagnosis Date Noted  . Vitamin D insufficiency 11/29/2016  . Hypogonadism in male 11/01/2016  . Type 1 diabetes mellitus ([place])   . Hypothyroidism    . Hypertension   . Hyperlipidemia   . Closed fracture of right fibula and tibia 10/29/2016  . Closed tibia fracture 10/29/2016  . Closed fracture of right tibial plafond with fibula involvement 10/29/2016  . Abnormal liver function tests 03/06/2013  . RASH AND OTHER NONSPECIFIC SKIN ERUPTION 10/07/2009  . SHOULDER PAIN, LEFT 12/27/2008  . Type I (juvenile type) diabetes mellitus without mention of complication, uncontrolled 12/03/2007  . HYPOTHYROIDISM 11/12/2007  . HYPERCHOLESTEROLEMIA 11/12/2007  . MULTIPLE SCLEROSIS 08/29/2007  . PROLIFERATIVE DIABETIC RETINOPATHY 08/29/2007  . HYPERTENSION 12/25/2006   Doil Kamara, SPTA  Lydell Moga 04/04/2017, 10:04 AM  [facility] [address] [place], [place], [address] Phone: [phone]   Fax:  [phone]  Name: Johnathan Ross MRN: [id number] Date of Birth: 1970-12-09

## 2017-04-10 ENCOUNTER — Ambulatory Visit: Payer: Medicare Other

## 2017-04-10 DIAGNOSIS — R2681 Unsteadiness on feet: Secondary | ICD-10-CM | POA: Diagnosis not present

## 2017-04-10 DIAGNOSIS — M25571 Pain in right ankle and joints of right foot: Secondary | ICD-10-CM | POA: Diagnosis not present

## 2017-04-10 DIAGNOSIS — M25671 Stiffness of right ankle, not elsewhere classified: Secondary | ICD-10-CM | POA: Diagnosis not present

## 2017-04-10 DIAGNOSIS — M6281 Muscle weakness (generalized): Secondary | ICD-10-CM | POA: Diagnosis not present

## 2017-04-10 DIAGNOSIS — R262 Difficulty in walking, not elsewhere classified: Secondary | ICD-10-CM

## 2017-04-10 NOTE — Therapy (Signed)
[facility] [address] [place], [place], [address] Phone: [phone]   Fax:  [phone]  Physical Therapy Treatment  Patient Details  Name: Johnathan Ross MRN: [id number] Date of Birth: 10/23/1970 Referring Provider: Altamese [place], MD   Encounter Date: 04/10/2017  PT End of Session - 04/10/17 1011    Visit Number  24    Number of Visits  30    Date for PT Re-Evaluation  04/23/17    Authorization Type  Kx modifier at visit 15, G-codes and PN every 10th visit    PT Start Time  0921    PT Stop Time  1005    PT Time Calculation (min)  44 min    Equipment Utilized During Treatment  -- min A-S prn    Activity Tolerance  Patient tolerated treatment well    Behavior During Therapy  [facility] for tasks assessed/performed       Past Medical History:  Diagnosis Date  . Hyperlipidemia   . Hypertension   . Hypogonadism in male 11/01/2016  . Hypothyroidism   . Multiple sclerosis ([place])   . 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 Type 1 diabetes mellitus ([place]) dx'd 1994  . Vitamin D insufficiency 11/29/2016    Past Surgical History:  Procedure Laterality Date  . EYE SURGERY Bilateral    "laser for diabetic retinopathy"  . FRACTURE SURGERY    . OPEN REDUCTION INTERNAL FIXATION (ORIF) TIBIA/FIBULA FRACTURE Right 10/30/2016  . ORIF ANKLE FRACTURE Left 2015  . RETINAL LASER PROCEDURE Bilateral    "for diabetic retinopathy"    There were no vitals filed for this visit.  Subjective Assessment - 04/10/17 0925    Subjective  Pt denied changes or falls since last visit.     Patient is accompained by:  Family member mom-Kathy    Patient Stated Goals  Walk without AD    Currently in Pain?  No/denies                      [facility] Adult PT Treatment/Exercise - 04/10/17 1007      Ambulation/Gait   Ambulation/Gait  Yes    Ambulation/Gait Assistance  4: Min guard;4: Min assist    Ambulation/Gait Assistance  Details  Pt amb. back to gym with tripod cane with min guard to min A (frequent standing rest breaks 2/2 fatigue and weakness/impaired balance). Pt then amb. with RW with S to ensure safety. Cues to decr. shoulder shrug, improve R toe clearance and B heel strike. AFO NOT donned during session.    Ambulation Distance (Feet)  75 Feet 10' with cane, 50', 75'x2 with RW    Assistive device  Rolling walker;Other (Comment) tripod cane    Gait Pattern  Step-through pattern;Poor foot clearance - right;Decreased dorsiflexion - right;Decreased hip/knee flexion - right;Decreased hip/knee flexion - left    Ambulation Surface  Level;Indoor      High Level Balance   High Level Balance Activities  Side stepping;Braiding;Backward walking;Marching forwards;Marching backwards    High Level Balance Comments  Pt performed in //bars with min guard to S for safety: 4-6 laps/activity with cues and demo for safety. Pt required standing and seated rest breaks 2/2 fatigue.           Balance Exercises - 04/10/17 1009      Balance Exercises: Standing   Other Standing Exercises  Performed in // bars with 1-2 UE support: pt performed toe/heel taps with BLEs to dots in  ant/lat/post directions to improve placement and SLS. Cues and demo for technique. Pt also performed mini lunges with green band around knees with focus on decr. B genu recurvatum 4x5 reps/LE.         PT Education - 04/10/17 1011    Education provided  Yes    Education Details  PT discussed the importance of taking breaks to reduce fatigue and improve safety. PT reiterated the importance of using RW vs. cane for safety and to reduce gait deviations.    Person(s) Educated  Patient    Methods  Explanation    Comprehension  Verbalized understanding       PT Short Term Goals - 04/04/17 0849      PT SHORT TERM GOAL #1   Title  Pt will perform standing HEP with supervision of family    Time  4    Period  Weeks    Status  Achieved      PT SHORT TERM  GOAL #2   Title  Pt will ambulate 250' on level surfaces with cane with supervision    Baseline  50' with cane and mod A    Time  4    Period  Weeks    Status  Not Met      PT SHORT TERM GOAL #3   Title  Pt will improve R ankle DF ROM to neutral (0) and increase PF by 10 deg    Baseline  98 deg DF, 30 deg PF    Time  4    Period  Weeks    Status  Achieved      PT SHORT TERM GOAL #4   Title  Pt will demonstrate improved LE strength as indicated by decrease in five time sit to stand < or = 15 seconds    Baseline  11.84 seconds    Time  4    Period  Weeks    Status  Achieved      PT SHORT TERM GOAL #5   Title  Pt will improve standing balance as indicated by increase in BERG balance score to > or = 42/56    Baseline  42/56    Time  4    Period  Weeks    Status  Achieved      PT SHORT TERM GOAL #6   Title  Pt will improve safety with gait as indicated by increase in gait velocity to >1.5 ft/sec with LRAD    Baseline  1.34 ft/sec; improved but not to goal    Time  4    Period  Weeks    Status  Partially Met        PT Long Term Goals - 04/04/17 0849      PT LONG TERM GOAL #1   Title  Pt will ambulate 300' on a variety of level indoor and outdoor paved surfaces with LRAD and supervision    Baseline  02/22/17: pt continues to need RW for stability, have trialed straight cane with min to mod assist needed    Time  8    Period  Weeks    Status  Revised      PT LONG TERM GOAL #2   Title  Pt will improve R ankle ROM to >98 deg ankle DF and >40 deg PF    Baseline  reassess at 4 weeks    Time  8    Period  Weeks    Status  Revised  PT LONG TERM GOAL #3   Title  Pt will improve LE strength as indicated by improvement in five time sit to stand to < or = 10 seconds    Time  8    Period  Weeks    Status  Revised      PT LONG TERM GOAL #5   Title  Pt will decrease falls risk as indicated by increase in BERG score to > or = 48/56    Time  8    Period  Weeks    Status   Revised      PT LONG TERM GOAL #6   Title  Pt will demonstrate ability to perform standing balance and strengthening HEP safely MOD I    Time  8    Period  Weeks    Status  Revised      PT LONG TERM GOAL #7   Title  Pt will improve gait velocity to > or = 1.8 ft/sec to indicate decreased risk for falls during gait in community    Baseline  02/22/17: 1.26 ft/sec with RW with 2 standing rest breaks for ~5-6 sec's each    Time  8    Period  Weeks    Status  Revised            Plan - 04/10/17 1012    Clinical Impression Statement  Pt demonstrated progress, as he was able to perform high level balance activities with less assist and improved weight shifting. Pt's B genu recurvatum improved with cues and NMR training. Pt continues to require frequent rest breaks 2/2 fatigue. Continue with POC.     Rehab Potential  Good    Clinical Impairments Affecting Rehab Potential  MS     PT Frequency  2x / week    PT Duration  8 weeks    PT Treatment/Interventions  ADLs/Self Care Home Management;Cryotherapy;Vasopneumatic Device;Taping;Gait training;Stair training;Functional mobility training;Therapeutic activities;Therapeutic exercise;Balance training;Neuromuscular re-education;Passive range of motion;Manual techniques;Patient/family education;Electrical Stimulation;DME Instruction;Orthotic Fit/Training;Energy conservation    PT Next Visit Plan  WILL HOLD ON OBTAINING NEW AFO FOR NOW; WILL CONTINUE TO STRENGTH RLE AND DECIDE IN A COUPLE OF WEEKS.  continue to address gait decreasing UE support, use of cane but focus on R hip closed chain hip ABD and trunk control training, LE strengthening especially hamstring and hip flexor, single limb stance and proximal hip stability, trunk/postural control, rocker board for ankle ROM and strength- monitor HR and fatigue level    PT Home Exercise Plan  Continue with LE strengthening and balance exercises    Consulted and Agree with Plan of Care  Patient;Family  member/caregiver    Family Member Consulted  mom       Patient will benefit from skilled therapeutic intervention in order to improve the following deficits and impairments:  Abnormal gait, Pain, Decreased range of motion, Decreased balance, Decreased mobility, Difficulty walking, Decreased activity tolerance, Decreased strength  Visit Diagnosis: Unsteadiness on feet  Muscle weakness (generalized)  Difficulty in walking, not elsewhere classified  Stiffness of right ankle, not elsewhere classified     Problem List Patient Active Problem List   Diagnosis Date Noted  . Vitamin D insufficiency 11/29/2016  . Hypogonadism in male 11/01/2016  . Type 1 diabetes mellitus ([place])   . Hypothyroidism   . Hypertension   . Hyperlipidemia   . Closed fracture of right fibula and tibia 10/29/2016  . Closed tibia fracture 10/29/2016  . Closed fracture of right tibial plafond with fibula  involvement 10/29/2016  . Abnormal liver function tests 03/06/2013  . RASH AND OTHER NONSPECIFIC SKIN ERUPTION 10/07/2009  . SHOULDER PAIN, LEFT 12/27/2008  . Type I (juvenile type) diabetes mellitus without mention of complication, uncontrolled 12/03/2007  . HYPOTHYROIDISM 11/12/2007  . HYPERCHOLESTEROLEMIA 11/12/2007  . MULTIPLE SCLEROSIS 08/29/2007  . PROLIFERATIVE DIABETIC RETINOPATHY 08/29/2007  . HYPERTENSION 12/25/2006    Cydni Reddoch L 04/10/2017, 10:13 AM  [facility] [address] [place], [place], [address] Phone: [phone]   Fax:  [phone]  Name: Johnathan Ross MRN: [id number] Date of Birth: 1971/05/21  Geoffry Paradise, PT,DPT 04/10/17 10:13 AM Phone: [phone] Fax: [phone]

## 2017-04-12 ENCOUNTER — Ambulatory Visit: Payer: Medicare Other

## 2017-04-12 DIAGNOSIS — M6281 Muscle weakness (generalized): Secondary | ICD-10-CM

## 2017-04-12 DIAGNOSIS — M25671 Stiffness of right ankle, not elsewhere classified: Secondary | ICD-10-CM | POA: Diagnosis not present

## 2017-04-12 DIAGNOSIS — M25571 Pain in right ankle and joints of right foot: Secondary | ICD-10-CM | POA: Diagnosis not present

## 2017-04-12 DIAGNOSIS — R2681 Unsteadiness on feet: Secondary | ICD-10-CM

## 2017-04-12 DIAGNOSIS — R262 Difficulty in walking, not elsewhere classified: Secondary | ICD-10-CM | POA: Diagnosis not present

## 2017-04-12 NOTE — Therapy (Signed)
[facility] [address] [place], [place], [address] Phone: [phone]   Fax:  [phone]  Physical Therapy Treatment  Patient Details  Name: Johnathan Ross MRN: [id number] Date of Birth: Feb 27, 1971 Referring Provider: Altamese DeLand, MD   Encounter Date: 04/12/2017  PT End of Session - 04/12/17 0930    Visit Number  25    Number of Visits  30    Date for PT Re-Evaluation  04/23/17    Authorization Type  Kx modifier at visit 15, G-codes and PN every 10th visit    PT Start Time  0852    PT Stop Time  0931    PT Time Calculation (min)  39 min    Equipment Utilized During Treatment  -- min A to S prn    Behavior During Therapy  [facility] for tasks assessed/performed       Past Medical History:  Diagnosis Date  . Hyperlipidemia   . Hypertension   . Hypogonadism in male 11/01/2016  . Hypothyroidism   . Multiple sclerosis ([place])   . 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 Type 1 diabetes mellitus ([place]) dx'd 1994  . Vitamin D insufficiency 11/29/2016    Past Surgical History:  Procedure Laterality Date  . EYE SURGERY Bilateral    "laser for diabetic retinopathy"  . FRACTURE SURGERY    . OPEN REDUCTION INTERNAL FIXATION (ORIF) TIBIA/FIBULA FRACTURE Right 10/30/2016  . ORIF ANKLE FRACTURE Left 2015  . ORIF TIBIA FRACTURE Right 10/30/2016   Procedure: OPEN REDUCTION INTERNAL FIXATION (ORIF) TIBIA FIBULA FRACTURE;  Surgeon: Altamese Hopkinton, MD;  Location: [facility];  Service: Orthopedics;  Laterality: Right;  . RETINAL LASER PROCEDURE Bilateral    "for diabetic retinopathy"    There were no vitals filed for this visit.  Subjective Assessment - 04/12/17 0856    Subjective  Pt     Patient is accompained by:  Family member dad    Pertinent History  MS dx about 6 years ago    Patient Stated Goals  Walk without AD    Currently in Pain?  No/denies                      [facility] Adult PT Treatment/Exercise -  04/12/17 0857      Ambulation/Gait   Ambulation/Gait  Yes    Ambulation/Gait Assistance  4: Min guard    Ambulation/Gait Assistance Details  Pt amb. with RW, and required seated and standing rest breaks 2/2 fatigue. Cues to improve upright posture and heel strike, and foot placement.     Ambulation Distance (Feet)  75 Feet x2, 230'    Assistive device  Rolling walker    Gait Pattern  Step-through pattern;Poor foot clearance - right;Decreased dorsiflexion - right;Decreased hip/knee flexion - right;Decreased hip/knee flexion - left    Ambulation Surface  Level;Indoor      Exercises   Other Exercises   -- knee/ankle      Knee/Hip Exercises: Aerobic   Other Aerobic  SciFit, level 5, with 4 extremities for strengthening and endurance. . 5 minutes. RPM>40      Knee/Hip Exercises: Standing   Other Standing Knee Exercises  Pt performed wall squats 3x5 reps with min A during one squat with LOB. Rest breaks 2/2 fatigue.      Knee/Hip Exercises: Seated   Other Seated Knee/Hip Exercises  Seated heel/toe raises during seated break x10 reps to improve strength with B ankles.  Balance Exercises - 04/12/17 0914      Balance Exercises: Standing   Standing Eyes Opened  Wide (BOA);Solid surface;5 reps;2 reps;30 secs in corner with S for safety.        PT Education - 04/12/17 0929    Education provided  Yes    Education Details  PT reiterated the importance of taking rest breaks.     Person(s) Educated  Patient    Methods  Explanation    Comprehension  Verbalized understanding       PT Short Term Goals - 04/04/17 0849      PT SHORT TERM GOAL #1   Title  Pt will perform standing HEP with supervision of family    Time  4    Period  Weeks    Status  Achieved      PT SHORT TERM GOAL #2   Title  Pt will ambulate 250' on level surfaces with cane with supervision    Baseline  50' with cane and mod A    Time  4    Period  Weeks    Status  Not Met      PT SHORT TERM GOAL #3    Title  Pt will improve R ankle DF ROM to neutral (0) and increase PF by 10 deg    Baseline  98 deg DF, 30 deg PF    Time  4    Period  Weeks    Status  Achieved      PT SHORT TERM GOAL #4   Title  Pt will demonstrate improved LE strength as indicated by decrease in five time sit to stand < or = 15 seconds    Baseline  11.84 seconds    Time  4    Period  Weeks    Status  Achieved      PT SHORT TERM GOAL #5   Title  Pt will improve standing balance as indicated by increase in BERG balance score to > or = 42/56    Baseline  42/56    Time  4    Period  Weeks    Status  Achieved      PT SHORT TERM GOAL #6   Title  Pt will improve safety with gait as indicated by increase in gait velocity to >1.5 ft/sec with LRAD    Baseline  1.34 ft/sec; improved but not to goal    Time  4    Period  Weeks    Status  Partially Met        PT Long Term Goals - 04/04/17 0849      PT LONG TERM GOAL #1   Title  Pt will ambulate 300' on a variety of level indoor and outdoor paved surfaces with LRAD and supervision    Baseline  02/22/17: pt continues to need RW for stability, have trialed straight cane with min to mod assist needed    Time  8    Period  Weeks    Status  Revised      PT LONG TERM GOAL #2   Title  Pt will improve R ankle ROM to >98 deg ankle DF and >40 deg PF    Baseline  reassess at 4 weeks    Time  8    Period  Weeks    Status  Revised      PT LONG TERM GOAL #3   Title  Pt will improve LE strength as indicated by improvement in  five time sit to stand to < or = 10 seconds    Time  8    Period  Weeks    Status  Revised      PT LONG TERM GOAL #5   Title  Pt will decrease falls risk as indicated by increase in BERG score to > or = 48/56    Time  8    Period  Weeks    Status  Revised      PT LONG TERM GOAL #6   Title  Pt will demonstrate ability to perform standing balance and strengthening HEP safely MOD I    Time  8    Period  Weeks    Status  Revised      PT LONG TERM  GOAL #7   Title  Pt will improve gait velocity to > or = 1.8 ft/sec to indicate decreased risk for falls during gait in community    Baseline  02/22/17: 1.26 ft/sec with RW with 2 standing rest breaks for ~5-6 sec's each    Time  8    Period  Weeks    Status  Revised            Plan - 04/12/17 0930    Clinical Impression Statement  Pt continues to require seated and standing rest breaks 2/2 fatigue. Pt demonstrated improvement in gait deviations and exercise technique after taking rest breaks. Pt's standing balance HEP is still challenging so PT encouraged pt to maintain exercises as prescribed. Continue with POC towards goals.     Rehab Potential  Good    Clinical Impairments Affecting Rehab Potential  MS     PT Frequency  2x / week    PT Duration  8 weeks    PT Treatment/Interventions  ADLs/Self Care Home Management;Cryotherapy;Vasopneumatic Device;Taping;Gait training;Stair training;Functional mobility training;Therapeutic activities;Therapeutic exercise;Balance training;Neuromuscular re-education;Passive range of motion;Manual techniques;Patient/family education;Electrical Stimulation;DME Instruction;Orthotic Fit/Training;Energy conservation    PT Next Visit Plan  WILL HOLD ON OBTAINING NEW AFO FOR NOW; WILL CONTINUE TO STRENGTH RLE AND DECIDE IN A COUPLE OF WEEKS.  continue to address gait decreasing UE support, use of cane but focus on R hip closed chain hip ABD and trunk control training, LE strengthening especially hamstring and hip flexor, single limb stance and proximal hip stability, trunk/postural control, rocker board for ankle ROM and strength- monitor HR and fatigue level    PT Home Exercise Plan  Continue with LE strengthening and balance exercises    Consulted and Agree with Plan of Care  Patient;Family member/caregiver    Family Member Consulted  mom       Patient will benefit from skilled therapeutic intervention in order to improve the following deficits and  impairments:  Abnormal gait, Pain, Decreased range of motion, Decreased balance, Decreased mobility, Difficulty walking, Decreased activity tolerance, Decreased strength  Visit Diagnosis: Muscle weakness (generalized)  Unsteadiness on feet     Problem List Patient Active Problem List   Diagnosis Date Noted  . Vitamin D insufficiency 11/29/2016  . Hypogonadism in male 11/01/2016  . Type 1 diabetes mellitus ([place])   . Hypothyroidism   . Hypertension   . Hyperlipidemia   . Closed fracture of right fibula and tibia 10/29/2016  . Closed tibia fracture 10/29/2016  . Closed fracture of right tibial plafond with fibula involvement 10/29/2016  . Abnormal liver function tests 03/06/2013  . RASH AND OTHER NONSPECIFIC SKIN ERUPTION 10/07/2009  . SHOULDER PAIN, LEFT 12/27/2008  . Type I (juvenile type) diabetes  mellitus without mention of complication, uncontrolled 12/03/2007  . HYPOTHYROIDISM 11/12/2007  . HYPERCHOLESTEROLEMIA 11/12/2007  . MULTIPLE SCLEROSIS 08/29/2007  . PROLIFERATIVE DIABETIC RETINOPATHY 08/29/2007  . HYPERTENSION 12/25/2006    Kydan Shanholtzer L 04/12/2017, 9:34 AM  [facility] [address] [place], [place], [address] Phone: [phone]   Fax:  [phone]  Name: BRANNEN KOPPEN MRN: [id number] Date of Birth: 1970/06/07  Geoffry Paradise, PT,DPT 04/12/17 9:35 AM Phone: [phone] Fax: [phone]

## 2017-04-22 ENCOUNTER — Encounter: Payer: Self-pay | Admitting: Physical Therapy

## 2017-04-22 ENCOUNTER — Ambulatory Visit: Payer: Medicare Other | Admitting: Physical Therapy

## 2017-04-22 DIAGNOSIS — M25671 Stiffness of right ankle, not elsewhere classified: Secondary | ICD-10-CM | POA: Diagnosis not present

## 2017-04-22 DIAGNOSIS — M25571 Pain in right ankle and joints of right foot: Secondary | ICD-10-CM | POA: Diagnosis not present

## 2017-04-22 DIAGNOSIS — R2681 Unsteadiness on feet: Secondary | ICD-10-CM

## 2017-04-22 DIAGNOSIS — R262 Difficulty in walking, not elsewhere classified: Secondary | ICD-10-CM | POA: Diagnosis not present

## 2017-04-22 DIAGNOSIS — M6281 Muscle weakness (generalized): Secondary | ICD-10-CM

## 2017-04-22 NOTE — Therapy (Signed)
[facility] [address] [place], [place], [address] Phone: [phone]   Fax:  [phone]  Physical Therapy Treatment  Patient Details  Name: Johnathan Ross MRN: [id number] Date of Birth: 10/24/1970 Referring Provider: Altamese Donalds, MD   Encounter Date: 04/22/2017  PT End of Session - 04/22/17 0936    Visit Number  26    Number of Visits  30    Date for PT Re-Evaluation  04/23/17    Authorization Type  Kx modifier at visit 15, G-codes and PN every 10th visit    PT Start Time  0933    PT Stop Time  1015    PT Time Calculation (min)  42 min    Equipment Utilized During Treatment  -- min A to S prn    Behavior During Therapy  [facility] for tasks assessed/performed       Past Medical History:  Diagnosis Date  . Hyperlipidemia   . Hypertension   . Hypogonadism in male 11/01/2016  . Hypothyroidism   . Multiple sclerosis ([place])   . 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 Type 1 diabetes mellitus ([place]) dx'd 1994  . Vitamin D insufficiency 11/29/2016    Past Surgical History:  Procedure Laterality Date  . EYE SURGERY Bilateral    "laser for diabetic retinopathy"  . FRACTURE SURGERY    . OPEN REDUCTION INTERNAL FIXATION (ORIF) TIBIA/FIBULA FRACTURE Right 10/30/2016  . ORIF ANKLE FRACTURE Left 2015  . ORIF TIBIA FRACTURE Right 10/30/2016   Procedure: OPEN REDUCTION INTERNAL FIXATION (ORIF) TIBIA FIBULA FRACTURE;  Surgeon: Altamese Argenta, MD;  Location: [facility];  Service: Orthopedics;  Laterality: Right;  . RETINAL LASER PROCEDURE Bilateral    "for diabetic retinopathy"    There were no vitals filed for this visit.  Subjective Assessment - 04/22/17 0935    Subjective  No new complaints, no falls or pain to report    Patient is accompained by:  Family member    Pertinent History  MS dx about 6 years ago    How long can you sit comfortably?  unlimited    How long can you stand comfortably?  unlimited    How long  can you walk comfortably?  household distances due to LE fatigue/weakness    Diagnostic tests  X-ray    Patient Stated Goals  Walk without AD    Currently in Pain?  No/denies         [facility] PT Assessment - 04/22/17 0937      Transfers   Five time sit to stand comments   5X STS= 12.88 sec from armed chair with no UE support required.       Ambulation/Gait   Ambulation/Gait  --    Gait velocity  10.63 secs= 3.08 ft/sec      Berg Balance Test   Sit to Stand  Able to stand without using hands and stabilize independently    Standing Unsupported  Able to stand 2 minutes with supervision    Sitting with Back Unsupported but Feet Supported on Floor or Stool  Able to sit safely and securely 2 minutes    Stand to Sit  Sits safely with minimal use of hands    Transfers  Able to transfer safely, minor use of hands    Standing Unsupported with Eyes Closed  Able to stand 10 seconds with supervision    Standing Ubsupported with Feet Together  Able to place feet together independently and stand for 1 minute  with supervision    From Standing, Reach Forward with Outstretched Arm  Can reach confidently >25 cm (10")    From Standing Position, Pick up Object from [place] to pick up shoe safely and easily    From Standing Position, Turn to Look Behind Over each Shoulder  Looks behind from both sides and weight shifts well    Turn 360 Degrees  Needs close supervision or verbal cueing    Standing Unsupported, Alternately Place Feet on Step/Stool  Needs assistance to keep from falling or unable to try    Standing Unsupported, One Foot in [place] to take small step independently and hold 30 seconds    Standing on One Leg  Able to lift leg independently and hold equal to or more than 3 seconds    Total Score  42    Berg comment:  42/56 Significant fall risk        PT Short Term Goals - 04/04/17 0849      PT SHORT TERM GOAL #1   Title  Pt will perform standing HEP with supervision of family     Time  4    Period  Weeks    Status  Achieved      PT SHORT TERM GOAL #2   Title  Pt will ambulate 250' on level surfaces with cane with supervision    Baseline  50' with cane and mod A    Time  4    Period  Weeks    Status  Not Met      PT SHORT TERM GOAL #3   Title  Pt will improve R ankle DF ROM to neutral (0) and increase PF by 10 deg    Baseline  98 deg DF, 30 deg PF    Time  4    Period  Weeks    Status  Achieved      PT SHORT TERM GOAL #4   Title  Pt will demonstrate improved LE strength as indicated by decrease in five time sit to stand < or = 15 seconds    Baseline  11.84 seconds    Time  4    Period  Weeks    Status  Achieved      PT SHORT TERM GOAL #5   Title  Pt will improve standing balance as indicated by increase in BERG balance score to > or = 42/56    Baseline  42/56    Time  4    Period  Weeks    Status  Achieved      PT SHORT TERM GOAL #6   Title  Pt will improve safety with gait as indicated by increase in gait velocity to >1.5 ft/sec with LRAD    Baseline  1.34 ft/sec; improved but not to goal    Time  4    Period  Weeks    Status  Partially Met        PT Long Term Goals - 04/22/17 0956      PT LONG TERM GOAL #1   Title  Pt will ambulate 300' on a variety of level indoor and outdoor paved surfaces with LRAD and supervision    Baseline  02/22/17: pt continues to need RW for stability, have trialed straight cane with min to mod assist needed    Time  8    Period  Weeks    Status  On-going    Target Date  04/23/17  PT LONG TERM GOAL #2   Title  Pt will improve R ankle ROM to >98 deg ankle DF and >40 deg PF    Baseline  reassess at 4 weeks    Time  8    Period  Weeks    Status  On-going    Target Date  04/23/17      PT LONG TERM GOAL #3   Title  Pt will improve LE strength as indicated by improvement in five time sit to stand to < or = 10 seconds    Baseline  04/22/17: 12.88 sec    Time  8    Period  Weeks    Status  Not Met     Target Date  04/23/17      PT LONG TERM GOAL #5   Title  Pt will decrease falls risk as indicated by increase in BERG score to > or = 48/56    Baseline  04/22/17: 42/56    Time  8    Period  Weeks    Status  Not Met    Target Date  04/23/17      PT LONG TERM GOAL #6   Title  Pt will demonstrate ability to perform standing balance and strengthening HEP safely MOD I    Time  8    Period  Weeks    Status  On-going    Target Date  04/23/17      PT LONG TERM GOAL #7   Title  Pt will improve gait velocity to > or = 1.8 ft/sec to indicate decreased risk for falls during gait in community    Baseline  04/22/17: 3.08 ft/sec with RW    Time  8    Period  Weeks    Status  Achieved    Target Date  04/23/17            Plan - 04/22/17 1523    Clinical Impression Statement  Pt tolerated treatment well with no limitations due to pain or fatigue. Todays session focused on long term goals. Pt walked into session with no standing rest break required which is an improvement. Pt would continue to improve from continued PT sessions.     Rehab Potential  Good    Clinical Impairments Affecting Rehab Potential  MS     PT Frequency  2x / week    PT Duration  8 weeks    PT Treatment/Interventions  ADLs/Self Care Home Management;Cryotherapy;Vasopneumatic Device;Taping;Gait training;Stair training;Functional mobility training;Therapeutic activities;Therapeutic exercise;Balance training;Neuromuscular re-education;Passive range of motion;Manual techniques;Patient/family education;Electrical Stimulation;DME Instruction;Orthotic Fit/Training;Energy conservation    PT Next Visit Plan  WILL HOLD ON OBTAINING NEW AFO FOR NOW; WILL CONTINUE TO STRENGTH RLE AND DECIDE IN A COUPLE OF WEEKS.  continue to address gait decreasing UE support, use of cane but focus on R hip closed chain hip ABD and trunk control training, LE strengthening especially hamstring and hip flexor, single limb stance and proximal hip stability,  trunk/postural control, rocker board for ankle ROM and strength- monitor HR and fatigue level    PT Home Exercise Plan  Complete remaining LTG's. Continue with LE strengthening and balance exercises.     Consulted and Agree with Plan of Care  Patient;Family member/caregiver    Family Member Consulted  mom       Patient will benefit from skilled therapeutic intervention in order to improve the following deficits and impairments:  Abnormal gait, Pain, Decreased range of motion, Decreased balance, Decreased mobility, Difficulty walking,  Decreased activity tolerance, Decreased strength  Visit Diagnosis: Muscle weakness (generalized)  Unsteadiness on feet     Problem List Patient Active Problem List   Diagnosis Date Noted  . Vitamin D insufficiency 11/29/2016  . Hypogonadism in male 11/01/2016  . Type 1 diabetes mellitus ([place])   . Hypothyroidism   . Hypertension   . Hyperlipidemia   . Closed fracture of right fibula and tibia 10/29/2016  . Closed tibia fracture 10/29/2016  . Closed fracture of right tibial plafond with fibula involvement 10/29/2016  . Abnormal liver function tests 03/06/2013  . RASH AND OTHER NONSPECIFIC SKIN ERUPTION 10/07/2009  . SHOULDER PAIN, LEFT 12/27/2008  . Type I (juvenile type) diabetes mellitus without mention of complication, uncontrolled 12/03/2007  . HYPOTHYROIDISM 11/12/2007  . HYPERCHOLESTEROLEMIA 11/12/2007  . MULTIPLE SCLEROSIS 08/29/2007  . PROLIFERATIVE DIABETIC RETINOPATHY 08/29/2007  . HYPERTENSION 12/25/2006   Jammal Sarr, SPTA  [place] Record 04/22/2017, 3:26 PM  [facility] [address] [place], [place], [address] Phone: [phone]   Fax:  [phone]  Name: DONAT HUMBLE MRN: [id number] Date of Birth: 02-28-1971

## 2017-04-25 ENCOUNTER — Ambulatory Visit: Payer: Medicare Other | Admitting: Physical Therapy

## 2017-04-25 DIAGNOSIS — M25571 Pain in right ankle and joints of right foot: Secondary | ICD-10-CM

## 2017-04-25 DIAGNOSIS — R262 Difficulty in walking, not elsewhere classified: Secondary | ICD-10-CM

## 2017-04-25 DIAGNOSIS — M6281 Muscle weakness (generalized): Secondary | ICD-10-CM

## 2017-04-25 DIAGNOSIS — R2681 Unsteadiness on feet: Secondary | ICD-10-CM | POA: Diagnosis not present

## 2017-04-25 DIAGNOSIS — M25671 Stiffness of right ankle, not elsewhere classified: Secondary | ICD-10-CM

## 2017-04-25 NOTE — Therapy (Addendum)
[facility] [address] [place], [place], [address] Phone: [phone]   Fax:  [phone]  Physical Therapy Treatment  Patient Details  Name: Johnathan Ross MRN: [id number] Date of Birth: February 09, 1971 Referring Provider: Altamese [place], MD   Encounter Date: 04/25/2017  PT End of Session - 04/25/17 0932    Visit Number  27    Number of Visits  30    Date for PT Re-Evaluation  04/23/17    Authorization Type  Kx modifier at visit 15, G-codes and PN every 10th visit    PT Start Time  0845    PT Stop Time  0931    PT Time Calculation (min)  46 min    Equipment Utilized During Treatment  Gait belt    Activity Tolerance  Patient tolerated treatment well    Behavior During Therapy  [facility] for tasks assessed/performed       Past Medical History:  Diagnosis Date  . Hyperlipidemia   . Hypertension   . Hypogonadism in male 11/01/2016  . Hypothyroidism   . Multiple sclerosis ([place])   . 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 Type 1 diabetes mellitus ([place]) dx'd 1994  . Vitamin D insufficiency 11/29/2016    Past Surgical History:  Procedure Laterality Date  . EYE SURGERY Bilateral    "laser for diabetic retinopathy"  . FRACTURE SURGERY    . OPEN REDUCTION INTERNAL FIXATION (ORIF) TIBIA/FIBULA FRACTURE Right 10/30/2016  . ORIF ANKLE FRACTURE Left 2015  . ORIF TIBIA FRACTURE Right 10/30/2016   Procedure: OPEN REDUCTION INTERNAL FIXATION (ORIF) TIBIA FIBULA FRACTURE;  Surgeon: Altamese McKenzie, MD;  Location: [facility];  Service: Orthopedics;  Laterality: Right;  . RETINAL LASER PROCEDURE Bilateral    "for diabetic retinopathy"    There were no vitals filed for this visit.  Subjective Assessment - 04/25/17 0849    Subjective  Pt states his legs are tired, but as a person he does not feel tired. Pt took several standing rest breaks when walking back for treatment today.    Patient is accompained by:  Family member    Pertinent History  MS dx about 6 years ago    How long can you sit comfortably?  unlimited    How long can you stand comfortably?  unlimited    How long can you walk comfortably?  household distances due to LE fatigue/weakness    Diagnostic tests  X-ray    Patient Stated Goals  Walk without AD    Currently in Pain?  No/denies       [facility] Adult PT Treatment/Exercise - 04/25/17 0001      Ambulation/Gait   Ambulation/Gait  Yes    Ambulation/Gait Assistance  4: Min assist    Ambulation/Gait Assistance Details  Pt ambulated with cane with tripod tip, and required several standing rest breaks due to fatigue. VC's for proper sequencing and to decrease step length.     Ambulation Distance (Feet)  245 Feet    Assistive device  Straight cane Cane with tripod tip    Gait Pattern  Step-through pattern;Poor foot clearance - right;Decreased dorsiflexion - right;Decreased hip/knee flexion - right;Decreased hip/knee flexion - left    Ambulation Surface  Level;Indoor      Posture/Postural Control   Postural Limitations  Weight shift left    Posture Comments  Pt with mild knee hyperextension during stance and standing on "y" ligaments for added support      Neuro Re-ed  Neuro Re-ed Details   Corner balance activities on level surface, feet together, EO, with directional head turns right/left, up/down. VC's for equal LE weight bearing/midline position, VC's for increased cervical flexion during head movement exercise. Min guard to min assist for balance correction and safety.     Pt performed SLS with 10-20 sec hold, x3 each leg. Pt had difficulty elevating RLE while balancing on LLE.    Pt performed toe taps at counter onto 3" bottom shelf with UE counter support, alternating x10 each foot. Min assist provided for safety.   Performed to improve weight shifting and balance.      Exercises   Exercises  Knee/Hip      Knee/Hip Exercises: Supine   Bridges  Strengthening;Both;10 reps    Bridges Limitations   With physioball x10, 5-sec hold at top        PT Short Term Goals - 04/25/17 1157      PT SHORT TERM GOAL #1   Title  Pt will perform standing HEP with supervision of family    Baseline  independent 01/22/2017    Time  4    Period  Weeks    Status  Achieved      PT SHORT TERM GOAL #2   Title  Pt will ambulate 250' on level surfaces with cane with supervision    Baseline  Ambulated 230' on level surface with cane with tripod tip with Min guard to Min Assist, 6-8 standing rest breaks    Period  Weeks    Status  On-going      PT SHORT TERM GOAL #3   Title  Pt will improve R ankle DF ROM to neutral (0) and increase PF by 10 deg    Baseline  11/29: 11 deg DF, 50 deg PF    Time  4    Period  Weeks    Status  Achieved      PT SHORT TERM GOAL #4   Title  Pt will demonstrate improved LE strength as indicated by decrease in five time sit to stand < or = 15 seconds    Baseline  11.84 seconds    Time  4    Period  Weeks    Status  Achieved      PT SHORT TERM GOAL #5   Title  Pt will improve standing balance as indicated by increase in BERG balance score to > or = 42/56    Baseline  42/56    Time  4    Period  Weeks    Status  Achieved      PT SHORT TERM GOAL #6   Title  Pt will improve safety with gait as indicated by increase in gait velocity to >1.5 ft/sec with LRAD    Baseline  1.34 ft/sec; improved but not to goal    Time  4    Period  Weeks    Status  Partially Met        PT Long Term Goals - 04/25/17 1210      PT LONG TERM GOAL #1   Title  Pt will ambulate 300' on a variety of level indoor and outdoor paved surfaces with LRAD and supervision    Baseline  11/29: Straight cane with tripod tip for 230' with Min Assist needed, is walking short distances (~100-200 feet) with RW with supervision, taking rest breaks as needed (standing rest breaks)    Time  --    Period  --  Status  Partially Met      PT LONG TERM GOAL #2   Title  Pt will improve R ankle ROM to >98  deg ankle DF and >40 deg PF    Baseline  11/29: R ankle DF 11 deg, PF 40 deg    Time  --    Period  --    Status  Partially Met      PT LONG TERM GOAL #3   Title  Pt will improve LE strength as indicated by improvement in five time sit to stand to < or = 10 seconds    Baseline  04/22/17: 12.88 sec    Time  8    Period  --    Status  Not Met      PT LONG TERM GOAL #4   Title  Pt will be able to peform sit to stand with no assistive device and will perform five times sit to stand in <15 seconds to indicate improved LE strength and balance during changes in position    Baseline  02/22/17: 18.16 with UE use, slower than initial time of 17.1 sec's, howver no balance loss noted    Time  --    Period  --    Status  Not Met      PT LONG TERM GOAL #5   Title  Pt will decrease falls risk as indicated by increase in BERG score to > or = 48/56    Baseline  04/22/17: 42/56    Time  8    Period  --    Status  Not Met      PT LONG TERM GOAL #6   Title  Pt will demonstrate ability to perform standing balance and strengthening HEP safely MOD I    Baseline  04/25/17: continues to need cues/assist to decrease compensatory movements    Time  --    Period  --    Status  On-going      PT LONG TERM GOAL #7   Title  Pt will improve gait velocity to > or = 1.8 ft/sec to indicate decreased risk for falls during gait in community    Baseline  04/22/17: 3.08 ft/sec with RW    Time  --    Period  --    Status  Achieved           Plan - 04/28/17 1500    Clinical Impression Statement  Pt tolerated treatment well and is very determined to improve upon his limitations. Session focused on gait training with cane, and weight shift and balance activities. Pt states he never looks down when ambulating, and he appears to lose his balance when he does look down. Pt would benefit from further PT sessions to improve his safety and functional independence, and continue to progress toward goals. Pt has met 2/7  LTG and continues to demonstrate improvements in RLE ROM, strength, endurance, dynamic balance and gait. Due to improvements pt is making steady progress towards goals of ambulating with SPC and return to MOD I baseline functional level. Pt will benefit from ongoing PT services to continue to address remaining impairments in LE ROM, strength, balance, endurance and gait to maximize functional mobility independence and decrease falls risk.    Rehab Potential  Good    Clinical Impairments Affecting Rehab Potential  MS     PT Frequency  2x / week    PT Duration  8 weeks    PT Treatment/Interventions  ADLs/Self  Care Home Management;Cryotherapy;Vasopneumatic Device;Taping;Gait training;Stair training;Functional mobility training;Therapeutic activities;Therapeutic exercise;Balance training;Neuromuscular re-education;Passive range of motion;Manual techniques;Patient/family education;Electrical Stimulation;DME Instruction;Orthotic Fit/Training;Energy conservation    PT Next Visit Plan  FOLLOW UP ON DISCUSSION ABOUT AFO; SET UP FOR BIONESS TRAINING.  ASSESS GAIT VELOCITY WITH CANE.  Gait training with cane. Focus on R hip closed chain hip ABD and trunk control training, LE strengthening especially hamstring and hip flexor, single limb stance and proximal hip stability, trunk/postural control, rocker board for ankle ROM and strength- monitor HR and fatigue level    Consulted and Agree with Plan of Care  Patient    Family Member Consulted  mom      Addendum to [place] entered by [name], PT, DPT 04/28/17    3:00 PM  For plan of care recertification.   Patient will benefit from skilled therapeutic intervention in order to improve the following deficits and impairments:  Abnormal gait, Pain, Decreased range of motion, Decreased balance, Decreased mobility, Difficulty walking, Decreased activity tolerance, Decreased strength  Visit Diagnosis: Muscle weakness (generalized)  Unsteadiness on feet  Difficulty  in walking, not elsewhere classified  Stiffness of right ankle, not elsewhere classified  Acute right ankle pain    Problem List Patient Active Problem List   Diagnosis Date Noted  . Vitamin D insufficiency 11/29/2016  . Hypogonadism in male 11/01/2016  . Type 1 diabetes mellitus ([place])   . Hypothyroidism   . Hypertension   . Hyperlipidemia   . Closed fracture of right fibula and tibia 10/29/2016  . Closed tibia fracture 10/29/2016  . Closed fracture of right tibial plafond with fibula involvement 10/29/2016  . Abnormal liver function tests 03/06/2013  . RASH AND OTHER NONSPECIFIC SKIN ERUPTION 10/07/2009  . SHOULDER PAIN, LEFT 12/27/2008  . Type I (juvenile type) diabetes mellitus without mention of complication, uncontrolled 12/03/2007  . HYPOTHYROIDISM 11/12/2007  . HYPERCHOLESTEROLEMIA 11/12/2007  . MULTIPLE SCLEROSIS 08/29/2007  . PROLIFERATIVE DIABETIC RETINOPATHY 08/29/2007  . HYPERTENSION 12/25/2006    Andria Meuse, SPTA 04/25/2017, 12:15 PM  [facility] [address] [place], [place], [address] Phone: [phone]   Fax:  [phone]  Name: LISSANDRO DILORENZO MRN: [id number] Date of Birth: 10-30-1970

## 2017-04-28 NOTE — Addendum Note (Signed)
Addended by: Misty Stanley F on: 04/28/2017 03:11 PM   Modules accepted: Orders

## 2017-05-01 ENCOUNTER — Ambulatory Visit: Payer: Medicare Other | Attending: Orthopedic Surgery | Admitting: Physical Therapy

## 2017-05-01 ENCOUNTER — Encounter: Payer: Self-pay | Admitting: Physical Therapy

## 2017-05-01 DIAGNOSIS — M25671 Stiffness of right ankle, not elsewhere classified: Secondary | ICD-10-CM | POA: Insufficient documentation

## 2017-05-01 DIAGNOSIS — R2681 Unsteadiness on feet: Secondary | ICD-10-CM | POA: Diagnosis not present

## 2017-05-01 DIAGNOSIS — M6281 Muscle weakness (generalized): Secondary | ICD-10-CM

## 2017-05-01 DIAGNOSIS — M25571 Pain in right ankle and joints of right foot: Secondary | ICD-10-CM | POA: Diagnosis not present

## 2017-05-01 DIAGNOSIS — R262 Difficulty in walking, not elsewhere classified: Secondary | ICD-10-CM | POA: Diagnosis not present

## 2017-05-02 NOTE — Therapy (Addendum)
[facility] [address] [place], [place], [address] Phone: [phone]   Fax:  [phone]  Physical Therapy Treatment  Patient Details  Name: Johnathan Ross Date of Birth: 25-Apr-1971 Referring Provider: Altamese [place], MD   Encounter Date: 05/01/2017  PT End of Session - 05/01/17 0936    Visit Number  28    Number of Visits  43 per recertification    Date for PT Re-Evaluation  93/81/82 per recertification    Authorization Type  Medicare: Kx modifier at visit 15, G-codes and PN every 10th visit    PT Start Time  0933    PT Stop Time  1015    PT Time Calculation (min)  42 min    Equipment Utilized During Treatment  Gait belt    Activity Tolerance  Patient tolerated treatment well    Behavior During Therapy  [facility] for tasks assessed/performed       Past Medical History:  Diagnosis Date  . Hyperlipidemia   . Hypertension   . Hypogonadism in male 11/01/2016  . Hypothyroidism   . Multiple sclerosis ([place])   . 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 Type 1 diabetes mellitus ([place]) dx'd 1994  . Vitamin D insufficiency 11/29/2016    Past Surgical History:  Procedure Laterality Date  . EYE SURGERY Bilateral    "laser for diabetic retinopathy"  . FRACTURE SURGERY    . OPEN REDUCTION INTERNAL FIXATION (ORIF) TIBIA/FIBULA FRACTURE Right 10/30/2016  . ORIF ANKLE FRACTURE Left 2015  . ORIF TIBIA FRACTURE Right 10/30/2016   Procedure: OPEN REDUCTION INTERNAL FIXATION (ORIF) TIBIA FIBULA FRACTURE;  Surgeon: Altamese [place], MD;  Location: [facility];  Service: Orthopedics;  Laterality: Right;  . RETINAL LASER PROCEDURE Bilateral    "for diabetic retinopathy"    There were no vitals filed for this visit.  Subjective Assessment - 05/01/17 0936    Subjective  No new complaints, no falls or pain to report. Pt arrived with RW vs. cane due to some fatigue this AM.     Patient is accompained by:  Family member     Pertinent History  MS dx about 6 years ago    How long can you sit comfortably?  unlimited    How long can you stand comfortably?  unlimited    How long can you walk comfortably?  household distances due to LE fatigue/weakness    Diagnostic tests  X-ray    Patient Stated Goals  Walk without AD    Currently in Pain?  No/denies       [facility] Adult PT Treatment/Exercise - 05/01/17 0937      Ambulation/Gait   Ambulation/Gait  Yes    Ambulation/Gait Assistance  5: Supervision;4: Min guard;4: Min assist    Ambulation/Gait Assistance Details  Pt ambulating with Bioness on RLE over anterior tib for neuro re-ed and DF assist. Pt dem'd  increased DF, however foot clearance became poor after fatigue    Ambulation Distance (Feet)  345 Feet 230, seated rest break, 115    Assistive device  Rolling walker    Gait Pattern  Step-through pattern;Decreased hip/knee flexion - right;Wide base of support    Ambulation Surface  Level;Indoor      Knee/Hip Exercises: Standing   Heel Raises  Both;1 set;10 reps    Heel Raises Limitations  Standing at paralell bars for BUE support, cues for posture/ proper form.    Hip Abduction  AROM;Stengthening;Both;1 set;10 reps    Abduction  Limitations  Standing at paralell bars with BUE support, tactile/verbal cues to avoid lateral trunk lean.     Hip Extension  AROM;Stengthening;Both;1 set;10 reps    Extension Limitations  Standing at paralell bars with BUE support, cues for posture, pt demonstrated fatigue with this task and required standing rest break in middle of exercise.     Other Standing Knee Exercises  Pt standing at paralell bars performing mini squats with intermittent UE support.     Other Standing Knee Exercises  Standing at paralell bars for single UE support performing standing marches x10 reps. Pt had difficulty raising RLE halfway through with noted fatigue, pt required standing rest break inbetween task, knee/hip flexion increased after rest break.        Acupuncturist Location  bioness to left ant tib for neuro red-ed and DF assistance concurrent with Armed forces training and education officer  ant tib neuro re-ed, strengthening and DF assist with swing phase of gait    Electrical Stimulation Parameters  all setting saved to Bioness tablet 1: intensity 26 mA, medial intensity 42 mA, symmetric waveform, pulse rate 40Hz , phase duration 300, interphase period 50, steering electrode, max stim time 4 sec's, ramp up 0.1 sec's, ramp down 0.3 sec, stim on with swing/off with stance at 20%                             Electrical Stimulation Goals  Strength;Wound        PT Short Term Goals - 04/28/17 1433      PT SHORT TERM GOAL #1   Title  Pt will participate in further assessment for appropriate AFO (no AFO, current off the shelf AFO, or new AFO?) to decrease risk for falls.    Baseline  has low off the shelf AFO that only prevents PF during swing phase; currently not the most appropriate    Status  New    Target Date  05/25/17      PT SHORT TERM GOAL #2   Title  Pt will ambulate 250' on level surfaces with cane with min A    Baseline  Ambulated 230' on level surface with cane with tripod tip with Min guard to Min Assist, 6-8 standing rest breaks    Status  Revised    Target Date  05/25/17      PT SHORT TERM GOAL #3   Title  Pt will improve R ankle DF ROM to >12 deg and increase PF >40 deg    Baseline  11/29: R ankle DF 11 deg, PF 40 deg     Status  Revised    Target Date  05/25/17      PT SHORT TERM GOAL #4   Title  Pt will demonstrate improved LE strength as indicated by decrease in five time sit to stand < or = 12 seconds with one UE support    Baseline  12.88 seconds    Status  Revised    Target Date  05/25/17      PT SHORT TERM GOAL #5   Title  Pt will improve standing balance as indicated by increase in BERG balance score to > or = 45/56    Baseline  42/56    Status  Revised    Target Date  05/25/17       PT SHORT TERM GOAL #6   Title  Pt will improve safety with gait  as indicated by increase in gait velocity to 1.72ft/sec with cane    Baseline  3.08 ft/sec with RW; TBA with cane    Status  Revised    Target Date  05/25/17        PT Long Term Goals - 04/25/17 1210      PT LONG TERM GOAL #1   Title  Pt will ambulate 300' on a variety of level indoor and outdoor paved surfaces and will negotiate 5 stairs with one rail, with cane/most appropriate AFO MOD I    Baseline  11/29: Straight cane with tripod tip for 230' with Min Assist needed, is walking short distances (~100-200 feet) with RW with supervision, taking rest breaks as needed (standing rest breaks)    Time  --    Period  --    Status  Revised    Target Date  06/24/17      PT LONG TERM GOAL #2   Title  --    Baseline  --    Time  --    Period  --    Status  --      PT LONG TERM GOAL #3   Title  Pt will improve LE strength as indicated by improvement in five time sit to stand to < or = 10 seconds    Baseline  04/22/17: 12.88 sec    Time  --    Period  --    Status  Revised    Target Date  06/24/17      PT LONG TERM GOAL #4   Title  --    Baseline  --    Time  --    Period  --    Status  --      PT LONG TERM GOAL #5   Title  Pt will decrease falls risk as indicated by increase in BERG score to > or = 48/56    Baseline  04/22/17: 42/56    Time  --    Period  --    Status  Revised    Target Date  06/24/17      PT LONG TERM GOAL #6   Title  Pt will demonstrate ability to perform standing balance and strengthening HEP safely MOD I    Baseline  04/25/17: continues to need cues/assist to decrease compensatory movements    Time  --    Period  --    Status  Revised    Target Date  06/24/17      PT LONG TERM GOAL #7   Title  Pt will improve gait velocity to > or = 2.6 ft/sec with cane and appropriate AFO     Baseline  04/22/17: 3.08 ft/sec with RW, cane TBD    Time  --    Period  --    Status  Revised     Target Date  06/24/17            Plan - 05/02/17 1206    Clinical Impression Statement  Pt tolerated treatment well with no limitations due to pain but some due to fatigue. Todays session focused on gait training with Bioness which increased pt's DF upon heel strike, and therapeutic exercise to increase LE strength. Pt would continue to benefit from skilled PT to progress towards goals.     Rehab Potential  Good    Clinical Impairments Affecting Rehab Potential  MS     PT Frequency  2x / week    PT  Duration  8 weeks    PT Treatment/Interventions  ADLs/Self Care Home Management;Cryotherapy;Vasopneumatic Device;Taping;Gait training;Stair training;Functional mobility training;Therapeutic activities;Therapeutic exercise;Balance training;Neuromuscular re-education;Passive range of motion;Manual techniques;Patient/family education;Electrical Stimulation;DME Instruction;Orthotic Fit/Training;Energy conservation    PT Next Visit Plan  FOLLOW UP ON DISCUSSION ABOUT AFO; Continue with [facility].  ASSESS GAIT VELOCITY WITH CANE.  Gait training with cane. Focus on R hip closed chain hip ABD and trunk control training, LE strengthening especially hamstring and hip flexor, single limb stance and proximal hip stability, trunk/postural control, rocker board for ankle ROM and strength- monitor HR and fatigue level    Consulted and Agree with Plan of Care  Patient    Family Member Consulted  mom      G-Codes - 2017/06/01 0809    Functional Assessment Tool Used (Outpatient Only)  BERG: 42/56, Five time sit to stand: 12.88 seconds, gait velocity with RW: 3.08 ft/sec    Functional Limitation  Mobility: Walking and moving around    Mobility: Walking and Moving Around Current Status [phone])  At least 20 percent but less than 40 percent impaired, limited or restricted    Mobility: Walking and Moving Around Goal Status [phone])  At least 20 percent but less than 40 percent impaired, limited or restricted       Physical Therapy Progress Note  Dates of Reporting Period: 03/11/2017 to 05/01/2017 (G code and PN entered late on Jun 01, 2017)  Objective Reports of Subjective Statement: See impression statement above  Objective Measurements: Five time sit to stand, BERG, gait velocity  Goal Update: See new STG and LTG set for new 8 week certification  Plan: Continue with PT updated POC x 8 more weeks.  Reason Skilled Services are Required: To continue to address impairments in LE ROM, weakness, impaired timing/sequencing of mm activation, impaired balance, gait and to decrease falls risk and maximize functional mobility independence.  G code and PN entered by PT: [name], PT, DPT 2017-06-01    8:15 AM    Patient will benefit from skilled therapeutic intervention in order to improve the following deficits and impairments:  Abnormal gait, Pain, Decreased range of motion, Decreased balance, Decreased mobility, Difficulty walking, Decreased activity tolerance, Decreased strength, Decreased endurance, Impaired flexibility  Visit Diagnosis: Muscle weakness (generalized)  Unsteadiness on feet  Difficulty in walking, not elsewhere classified  Stiffness of right ankle, not elsewhere classified  Acute right ankle pain     Problem List Patient Active Problem List   Diagnosis Date Noted  . Vitamin D insufficiency 11/29/2016  . Hypogonadism in male 11/01/2016  . Type 1 diabetes mellitus ([place])   . Hypothyroidism   . Hypertension   . Hyperlipidemia   . Closed fracture of right fibula and tibia 10/29/2016  . Closed tibia fracture 10/29/2016  . Closed fracture of right tibial plafond with fibula involvement 10/29/2016  . Abnormal liver function tests 03/06/2013  . RASH AND OTHER NONSPECIFIC SKIN ERUPTION 10/07/2009  . SHOULDER PAIN, LEFT 12/27/2008  . Type I (juvenile type) diabetes mellitus without mention of complication, uncontrolled 12/03/2007  . HYPOTHYROIDISM 11/12/2007  .  HYPERCHOLESTEROLEMIA 11/12/2007  . MULTIPLE SCLEROSIS 08/29/2007  . PROLIFERATIVE DIABETIC RETINOPATHY 08/29/2007  . HYPERTENSION 12/25/2006   Johnathan Ross, SPTA   Johnathan Ross 05/02/2017, 12:08 PM  [facility] [address] [place], [place], [address] Phone: [phone]   Fax:  [phone]  Name: Johnathan Ross MRN: [id number] Date of Birth: 05-14-1971  This note has been reviewed and edited  by supervising CI.  Added in details on Bioness.  Johnathan Ross, PTA, [facility] [address], [place], [place] [address] [phone] 05/02/17, 1:50 PM   Attestation signed by Drema Balzarine, PTA at 05/02/2017 1:50 PM  This entire session was performed under direct supervision and direction of a licensed therapist/therapist assistant . I have personally read, edited and approve of the note as written.  Johnathan Ross, PTA, [facility] [address], [place], [place] [address] [phone] 05/02/17, 1:50 PM

## 2017-05-03 ENCOUNTER — Ambulatory Visit: Payer: Medicare Other | Admitting: Physical Therapy

## 2017-05-03 DIAGNOSIS — M6281 Muscle weakness (generalized): Secondary | ICD-10-CM | POA: Diagnosis not present

## 2017-05-03 DIAGNOSIS — M25571 Pain in right ankle and joints of right foot: Secondary | ICD-10-CM | POA: Diagnosis not present

## 2017-05-03 DIAGNOSIS — R2681 Unsteadiness on feet: Secondary | ICD-10-CM | POA: Diagnosis not present

## 2017-05-03 DIAGNOSIS — R262 Difficulty in walking, not elsewhere classified: Secondary | ICD-10-CM | POA: Diagnosis not present

## 2017-05-03 DIAGNOSIS — M25671 Stiffness of right ankle, not elsewhere classified: Secondary | ICD-10-CM | POA: Diagnosis not present

## 2017-05-03 NOTE — Therapy (Signed)
[facility] [address] [place], [place], [address] Phone: [phone]   Fax:  [phone]  Physical Therapy Treatment  Patient Details  Name: Johnathan Ross MRN: [id number] Date of Birth: [birth year] Referring Provider: Altamese Houghton, MD   Encounter Date: 05/03/2017  Johnathan Ross End of Session - 05/03/17 1148    Visit Number  29    Number of Visits  43 per recertification    Date for Johnathan Ross Re-Evaluation  19/41/74 per recertification    Authorization Type  Medicare: Kx modifier at visit 15, G-codes and PN every 10th visit    Johnathan Ross Start Time  0849    Johnathan Ross Stop Time  0934    Johnathan Ross Time Calculation (min)  45 min    Activity Tolerance  Patient tolerated treatment well    Behavior During Therapy  [facility] for tasks assessed/performed       Past Medical History:  Diagnosis Date  . Hyperlipidemia   . Hypertension   . Hypogonadism in male 11/01/2016  . Hypothyroidism   . Multiple sclerosis ([place])   . 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 Type 1 diabetes mellitus ([place]) dx'd 1994  . Vitamin D insufficiency 11/29/2016    Past Surgical History:  Procedure Laterality Date  . EYE SURGERY Bilateral    "laser for diabetic retinopathy"  . FRACTURE SURGERY    . OPEN REDUCTION INTERNAL FIXATION (ORIF) TIBIA/FIBULA FRACTURE Right 10/30/2016  . ORIF ANKLE FRACTURE Left 2015  . ORIF TIBIA FRACTURE Right 10/30/2016   Procedure: OPEN REDUCTION INTERNAL FIXATION (ORIF) TIBIA FIBULA FRACTURE;  Surgeon: Altamese , MD;  Location: [facility];  Service: Orthopedics;  Laterality: Right;  . RETINAL LASER PROCEDURE Bilateral    "for diabetic retinopathy"    There were no vitals filed for this visit.  Subjective Assessment - 05/03/17 1138    Subjective  No new issues to report, no pain.  Has restarted Ampyra but does not take it consistently.  Feeling fatigued this morning.    Patient is accompained by:  Family member    Pertinent History  MS dx about 6  years ago    How long can you sit comfortably?  unlimited    How long can you stand comfortably?  unlimited    How long can you walk comfortably?  household distances due to LE fatigue/weakness    Diagnostic tests  X-ray    Patient Stated Goals  Walk without AD    Currently in Pain?  No/denies                      [facility] Adult Johnathan Ross Treatment/Exercise - 05/03/17 1140      Ambulation/Gait   Ambulation/Gait  Yes    Ambulation/Gait Assistance  5: Supervision    Ambulation/Gait Assistance Details  See pattern below describing gait as Johnathan Ross ambulated to treatment area with RW; continued gait training and NMR with Bioness with improvement in distance Johnathan Ross is able to ambulate prior to requiring rest break, increased gait speed and carryover of improved gait pattern once BIONESS turned off and removed from RLE.    Ambulation Distance (Feet)  300 Feet    Assistive device  Rolling walker    Gait Pattern  Step-through pattern;Decreased hip/knee flexion - right;Decreased dorsiflexion - right;Right genu recurvatum;Poor foot clearance - right    Ambulation Surface  Level;Indoor      Neuro Re-ed    Neuro Re-ed Details   NMR with Bioness during gait with immediate  improvement in foot clearance during swing phase, closed chain anterior tibial translation over R foot during stance, improved terminal hip extension during terminal stance, and improved knee control with decreased genu recurvatum during mid stance.  When Bioness removed Johnathan Ross demonstrated brief carry over of gait sequence when ambulating with RW back to waiting area.  With further distance Johnathan Ross did experience LE fatigue with return to R foot toe drag at initial swing and required more frequent standing rest breaks.      Acupuncturist Location  functional estim to R anterior tibialis and R hamstring mm groups    Electrical Stimulation Action  R ankle DF closed and open chain and knee flexion during swing phase,  stance phase knee control    Electrical Stimulation Parameters  see bioness control unit    Electrical Stimulation Goals  Neuromuscular facilitation;Other (comment) functional muscular endurance             Johnathan Ross Education - 05/03/17 1139    Education provided  Yes    Education Details  purpose and use of Bioness    Person(s) Educated  Patient    Methods  Explanation    Comprehension  Verbalized understanding       Johnathan Ross Short Term Goals - 04/28/17 1433      Johnathan Ross SHORT TERM GOAL #1   Title  Johnathan Ross will participate in further assessment for appropriate AFO (no AFO, current off the shelf AFO, or new AFO?) to decrease risk for falls.    Baseline  has low off the shelf AFO that only prevents PF during swing phase; currently not the most appropriate    Status  New    Target Date  05/25/17      Johnathan Ross SHORT TERM GOAL #2   Title  Johnathan Ross will ambulate 250' on level surfaces with cane with min A    Baseline  Ambulated 230' on level surface with cane with tripod tip with Min guard to Min Assist, 6-8 standing rest breaks    Status  Revised    Target Date  05/25/17      Johnathan Ross SHORT TERM GOAL #3   Title  Johnathan Ross will improve R ankle DF ROM to >12 deg and increase PF >40 deg    Baseline  11/29: R ankle DF 11 deg, PF 40 deg     Status  Revised    Target Date  05/25/17      Johnathan Ross SHORT TERM GOAL #4   Title  Johnathan Ross will demonstrate improved LE strength as indicated by decrease in five time sit to stand < or = 12 seconds with one UE support    Baseline  12.88 seconds    Status  Revised    Target Date  05/25/17      Johnathan Ross SHORT TERM GOAL #5   Title  Johnathan Ross will improve standing balance as indicated by increase in BERG balance score to > or = 45/56    Baseline  42/56    Status  Revised    Target Date  05/25/17      Johnathan Ross SHORT TERM GOAL #6   Title  Johnathan Ross will improve safety with gait as indicated by increase in gait velocity to 1.12ft/sec with cane    Baseline  3.08 ft/sec with RW; TBA with cane    Status  Revised    Target  Date  05/25/17        Johnathan Ross Long Term Goals - 04/25/17 1210  Johnathan Ross LONG TERM GOAL #1   Title  Johnathan Ross will ambulate 300' on a variety of level indoor and outdoor paved surfaces and will negotiate 5 stairs with one rail, with cane/most appropriate AFO MOD I    Baseline  11/29: Straight cane with tripod tip for 230' with Min Assist needed, is walking short distances (~100-200 feet) with RW with supervision, taking rest breaks as needed (standing rest breaks)    Time  --    Period  --    Status  Revised    Target Date  06/24/17      Johnathan Ross LONG TERM GOAL #2   Title  --    Baseline  --    Time  --    Period  --    Status  --      Johnathan Ross LONG TERM GOAL #3   Title  Johnathan Ross will improve LE strength as indicated by improvement in five time sit to stand to < or = 10 seconds    Baseline  04/22/17: 12.88 sec    Time  --    Period  --    Status  Revised    Target Date  06/24/17      Johnathan Ross LONG TERM GOAL #4   Title  --    Baseline  --    Time  --    Period  --    Status  --      Johnathan Ross LONG TERM GOAL #5   Title  Johnathan Ross will decrease falls risk as indicated by increase in BERG score to > or = 48/56    Baseline  04/22/17: 42/56    Time  --    Period  --    Status  Revised    Target Date  06/24/17      Johnathan Ross LONG TERM GOAL #6   Title  Johnathan Ross will demonstrate ability to perform standing balance and strengthening HEP safely MOD I    Baseline  04/25/17: continues to need cues/assist to decrease compensatory movements    Time  --    Period  --    Status  Revised    Target Date  06/24/17      Johnathan Ross LONG TERM GOAL #7   Title  Johnathan Ross will improve gait velocity to > or = 2.6 ft/sec with cane and appropriate AFO     Baseline  04/22/17: 3.08 ft/sec with RW, cane TBD    Time  --    Period  --    Status  Revised    Target Date  06/24/17            Plan - 05/03/17 1148    Clinical Impression Statement  Continued NMR and gait training with use of BIONESS with addition of thigh unit to R hamstring mm group with immediate  improvement in gait sequence and with brief carryover when Bioness removed.  Johnathan Ross continues to present with decreased muscular endurance and requires frequent standing rest breaks.  Will continue to progress towards LTG.    Rehab Potential  Good    Clinical Impairments Affecting Rehab Potential  MS     Johnathan Ross Frequency  2x / week    Johnathan Ross Duration  8 weeks    Johnathan Ross Treatment/Interventions  ADLs/Self Care Home Management;Cryotherapy;Vasopneumatic Device;Taping;Gait training;Stair training;Functional mobility training;Therapeutic activities;Therapeutic exercise;Balance training;Neuromuscular re-education;Passive range of motion;Manual techniques;Patient/family education;Electrical Stimulation;DME Instruction;Orthotic Fit/Training;Energy conservation    Johnathan Ross Next Visit Plan  10TH VISIT G CODE AND PN; BIONESS TRAINING: MEASURE GAIT SPEED  WITHOUT BIONESS, WITH BIONESS WITH RW THEN WITH CANE.  TRAINING MODE FOR ANKLE DF AND HAMSTRING STRENGTHENING.  FOLLOW UP ON DISCUSSION ABOUT AFO; Focus on R hip closed chain hip ABD and trunk control training, LE strengthening especially hamstring and hip flexor, single limb stance and proximal hip stability, trunk/postural control, rocker board for ankle ROM and strength- monitor HR and fatigue level    Consulted and Agree with Plan of Care  Patient    Family Member Consulted  mom       Patient will benefit from skilled therapeutic intervention in order to improve the following deficits and impairments:  Abnormal gait, Pain, Decreased range of motion, Decreased balance, Decreased mobility, Difficulty walking, Decreased activity tolerance, Decreased strength, Decreased endurance, Impaired flexibility  Visit Diagnosis: Muscle weakness (generalized)  Unsteadiness on feet  Difficulty in walking, not elsewhere classified     Problem List Patient Active Problem List   Diagnosis Date Noted  . Vitamin D insufficiency 11/29/2016  . Hypogonadism in male 11/01/2016  . Type 1  diabetes mellitus ([place])   . Hypothyroidism   . Hypertension   . Hyperlipidemia   . Closed fracture of right fibula and tibia 10/29/2016  . Closed tibia fracture 10/29/2016  . Closed fracture of right tibial plafond with fibula involvement 10/29/2016  . Abnormal liver function tests 03/06/2013  . RASH AND OTHER NONSPECIFIC SKIN ERUPTION 10/07/2009  . SHOULDER PAIN, LEFT 12/27/2008  . Type I (juvenile type) diabetes mellitus without mention of complication, uncontrolled 12/03/2007  . HYPOTHYROIDISM 11/12/2007  . HYPERCHOLESTEROLEMIA 11/12/2007  . MULTIPLE SCLEROSIS 08/29/2007  . PROLIFERATIVE DIABETIC RETINOPATHY 08/29/2007  . HYPERTENSION 12/25/2006    Johnathan Ross, Johnathan Ross, Johnathan Ross 05/03/17    1:01 PM    [facility] [address] [place], [place], [address] Phone: [phone]   Fax:  [phone]  Name: IZAAH WESTMAN MRN: [id number] Date of Birth: 08-16-1970

## 2017-05-06 ENCOUNTER — Ambulatory Visit: Payer: Medicare Other | Admitting: Physical Therapy

## 2017-05-09 ENCOUNTER — Encounter: Payer: Self-pay | Admitting: Physical Therapy

## 2017-05-09 ENCOUNTER — Ambulatory Visit: Payer: Medicare Other | Admitting: Physical Therapy

## 2017-05-09 DIAGNOSIS — R2681 Unsteadiness on feet: Secondary | ICD-10-CM | POA: Diagnosis not present

## 2017-05-09 DIAGNOSIS — M6281 Muscle weakness (generalized): Secondary | ICD-10-CM

## 2017-05-09 DIAGNOSIS — R262 Difficulty in walking, not elsewhere classified: Secondary | ICD-10-CM | POA: Diagnosis not present

## 2017-05-09 DIAGNOSIS — M25571 Pain in right ankle and joints of right foot: Secondary | ICD-10-CM | POA: Diagnosis not present

## 2017-05-09 DIAGNOSIS — M25671 Stiffness of right ankle, not elsewhere classified: Secondary | ICD-10-CM | POA: Diagnosis not present

## 2017-05-09 NOTE — Therapy (Signed)
[facility] [address] [place], [place], [address] Phone: [phone]   Fax:  [phone]  Physical Therapy Treatment  Patient Details  Name: Johnathan Ross MRN: [id number] Date of Birth: [birth year] Referring Provider: Altamese [place], MD   Encounter Date: 05/09/2017  PT End of Session - 05/09/17 1049    Visit Number  30 next G code due on visit 38 -keep note!    Number of Visits  43 per recertification    Date for PT Re-Evaluation  51/88/41 per recertification    Authorization Type  Medicare: Kx modifier at visit 15, G-codes and PN every 10th visit (visit 66)    PT Start Time  0845    PT Stop Time  0933    PT Time Calculation (min)  48 min    Equipment Utilized During Treatment  Gait belt    Activity Tolerance  Patient tolerated treatment well    Behavior During Therapy  WFL for tasks assessed/performed       Past Medical History:  Diagnosis Date  . Hyperlipidemia   . Hypertension   . Hypogonadism in male 11/01/2016  . Hypothyroidism   . Multiple sclerosis ([place])   . 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 Type 1 diabetes mellitus ([place]) dx'd 1994  . Vitamin D insufficiency 11/29/2016    Past Surgical History:  Procedure Laterality Date  . EYE SURGERY Bilateral    "laser for diabetic retinopathy"  . FRACTURE SURGERY    . OPEN REDUCTION INTERNAL FIXATION (ORIF) TIBIA/FIBULA FRACTURE Right 10/30/2016  . ORIF ANKLE FRACTURE Left 2015  . ORIF TIBIA FRACTURE Right 10/30/2016   Procedure: OPEN REDUCTION INTERNAL FIXATION (ORIF) TIBIA FIBULA FRACTURE;  Surgeon: Altamese [place], MD;  Location: [facility];  Service: Orthopedics;  Laterality: Right;  . RETINAL LASER PROCEDURE Bilateral    "for diabetic retinopathy"    There were no vitals filed for this visit.  Subjective Assessment - 05/09/17 0844    Subjective  No issues to report, did not have any issues with the weather.  Less fatigue today.    Patient is  accompained by:  Family member    Pertinent History  MS dx about 6 years ago    How long can you sit comfortably?  unlimited    How long can you stand comfortably?  unlimited    How long can you walk comfortably?  household distances due to LE fatigue/weakness    Diagnostic tests  X-ray    Patient Stated Goals  Walk without AD    Currently in Pain?  No/denies         [facility] PT Assessment - 05/09/17 0920      Ambulation/Gait   Ambulation/Gait  Yes    Ambulation/Gait Assistance  5: Supervision;3: Mod assist supervision with RW, mod A with cane    Ambulation/Gait Assistance Details  Performed gait velocity and safety training without use of BIONESS and then with BIONESS first with RW and then with cane.  Pt demonstrating increased hip ER and decreased hamstring control of genu recurvatum in stance with Bioness today compared to last week and decreased advancement of tibia over foot during stance.  Pt fatigued quicker today.    Ambulation Distance (Feet)  115 Feet    Assistive device  Rolling walker;Straight cane    Gait Pattern  Step-through pattern;Decreased hip/knee flexion - right;Decreased dorsiflexion - right;Right genu recurvatum;Poor foot clearance - right    Ambulation Surface  Level;Indoor    Gait velocity  17.59 seconds or 1.86 ft/sec without Bioness but with RW; 16 seconds or 2.05 ft/sec with Bioness with RW, 59 seconds or .55 ft/sec with Bioness and cane with mod A of therapist                  [facility] Adult PT Treatment/Exercise - 05/09/17 0920      Neuro Re-ed    Neuro Re-ed Details   NMR with BIONESS in training mode focusing on blocked practice of mm timing, sequencing during gait sequence: pt placed R foot on half foam roller to facilitate ant/post weight shift with ankle DF/PF and hamstring activation for stance knee control and terminal hip extension while advancing LLE forwards over roll and then back.  Also performed bilat ankle PF, DF on half foam roll  coordinating with on/off time of BIONESS training mode, tactile cues for increased knee control and weight shifting anterior/posterior      Electrical Stimulation   Electrical Stimulation Location  functional estim to R anterior tibialis and R hamstring mm groups    Electrical Stimulation Action  R ankle DF closed chain, R hamstring knee flexion and hip extension open chain and closed chain during gait and NMR standing activities    Electrical Stimulation Parameters  see bioness control unit    Electrical Stimulation Goals  Neuromuscular facilitation;Other (comment) gait             PT Education - 05/09/17 1049    Education provided  Yes    Education Details  plan for scheduling additional visits    Person(s) Educated  Patient;Parent(s)    Methods  Explanation    Comprehension  Verbalized understanding       PT Short Term Goals - 04/28/17 1433      PT SHORT TERM GOAL #1   Title  Pt will participate in further assessment for appropriate AFO (no AFO, current off the shelf AFO, or new AFO?) to decrease risk for falls.    Baseline  has low off the shelf AFO that only prevents PF during swing phase; currently not the most appropriate    Status  New    Target Date  05/25/17      PT SHORT TERM GOAL #2   Title  Pt will ambulate 250' on level surfaces with cane with min A    Baseline  Ambulated 230' on level surface with cane with tripod tip with Min guard to Min Assist, 6-8 standing rest breaks    Status  Revised    Target Date  05/25/17      PT SHORT TERM GOAL #3   Title  Pt will improve R ankle DF ROM to >12 deg and increase PF >40 deg    Baseline  11/29: R ankle DF 11 deg, PF 40 deg     Status  Revised    Target Date  05/25/17      PT SHORT TERM GOAL #4   Title  Pt will demonstrate improved LE strength as indicated by decrease in five time sit to stand < or = 12 seconds with one UE support    Baseline  12.88 seconds    Status  Revised    Target Date  05/25/17      PT  SHORT TERM GOAL #5   Title  Pt will improve standing balance as indicated by increase in BERG balance score to > or = 45/56    Baseline  42/56    Status  Revised  Target Date  05/25/17      PT SHORT TERM GOAL #6   Title  Pt will improve safety with gait as indicated by increase in gait velocity to 1.6ft/sec with cane    Baseline  3.08 ft/sec with RW; TBA with cane    Status  Revised    Target Date  05/25/17        PT Long Term Goals - 04/25/17 1210      PT LONG TERM GOAL #1   Title  Pt will ambulate 300' on a variety of level indoor and outdoor paved surfaces and will negotiate 5 stairs with one rail, with cane/most appropriate AFO MOD I    Baseline  11/29: Straight cane with tripod tip for 230' with Min Assist needed, is walking short distances (~100-200 feet) with RW with supervision, taking rest breaks as needed (standing rest breaks)    Time  --    Period  --    Status  Revised    Target Date  06/24/17      PT LONG TERM GOAL #2   Title  --    Baseline  --    Time  --    Period  --    Status  --      PT LONG TERM GOAL #3   Title  Pt will improve LE strength as indicated by improvement in five time sit to stand to < or = 10 seconds    Baseline  04/22/17: 12.88 sec    Time  --    Period  --    Status  Revised    Target Date  06/24/17      PT LONG TERM GOAL #4   Title  --    Baseline  --    Time  --    Period  --    Status  --      PT LONG TERM GOAL #5   Title  Pt will decrease falls risk as indicated by increase in BERG score to > or = 48/56    Baseline  04/22/17: 42/56    Time  --    Period  --    Status  Revised    Target Date  06/24/17      PT LONG TERM GOAL #6   Title  Pt will demonstrate ability to perform standing balance and strengthening HEP safely MOD I    Baseline  04/25/17: continues to need cues/assist to decrease compensatory movements    Time  --    Period  --    Status  Revised    Target Date  06/24/17      PT LONG TERM GOAL #7   Title   Pt will improve gait velocity to > or = 2.6 ft/sec with cane and appropriate AFO     Baseline  04/22/17: 3.08 ft/sec with RW, cane TBD    Time  --    Period  --    Status  Revised    Target Date  06/24/17            Plan - 05/09/17 1050    Clinical Impression Statement  Continued NMR and gait training with use of BIONESS with assessment of safety/gait velocity without and then with Bioness with RW and then with cane.  Pt demonstrates the greatest safety and decreased falls risk with RW and use of Bioness.  Pt continues to demonstrate slower gait velocity and more impaired gait sequence with cane, even  with Bioness and continues to be unsafe for use of cane without therapist assistance.  Will continue to address and will continue to progress towards LTG.    Rehab Potential  Good    Clinical Impairments Affecting Rehab Potential  MS     PT Frequency  2x / week    PT Duration  8 weeks    PT Treatment/Interventions  ADLs/Self Care Home Management;Cryotherapy;Vasopneumatic Device;Taping;Gait training;Stair training;Functional mobility training;Therapeutic activities;Therapeutic exercise;Balance training;Neuromuscular re-education;Passive range of motion;Manual techniques;Patient/family education;Electrical Stimulation;DME Instruction;Orthotic Fit/Training;Energy conservation    PT Next Visit Plan  Bioness gait training with RW and with cane; TRAINING MODE FOR ANKLE DF AND HAMSTRING STRENGTHENING.  FOLLOW UP ON DISCUSSION ABOUT AFO; Focus on R hip closed chain hip ABD and trunk control training, LE strengthening especially hamstring and hip flexor, single limb stance and proximal hip stability, trunk/postural control, rocker board for ankle ROM and strength- monitor HR and fatigue level    Consulted and Agree with Plan of Care  Patient    Family Member Consulted  mom       Patient will benefit from skilled therapeutic intervention in order to improve the following deficits and impairments:   Abnormal gait, Pain, Decreased range of motion, Decreased balance, Decreased mobility, Difficulty walking, Decreased activity tolerance, Decreased strength, Decreased endurance, Impaired flexibility  Visit Diagnosis: Muscle weakness (generalized)  Unsteadiness on feet  Difficulty in walking, not elsewhere classified   G-Codes - 05-31-2017 0809    Functional Assessment Tool Used (Outpatient Only)  BERG: 42/56, Five time sit to stand: 12.88 seconds, gait velocity with RW: 3.08 ft/sec    Functional Limitation  Mobility: Walking and moving around    Mobility: Walking and Moving Around Current Status [phone])  At least 20 percent but less than 40 percent impaired, limited or restricted    Mobility: Walking and Moving Around Goal Status [phone])  At least 20 percent but less than 40 percent impaired, limited or restricted       Problem List Patient Active Problem List   Diagnosis Date Noted  . Vitamin D insufficiency 11/29/2016  . Hypogonadism in male 11/01/2016  . Type 1 diabetes mellitus ([place])   . Hypothyroidism   . Hypertension   . Hyperlipidemia   . Closed fracture of right fibula and tibia 10/29/2016  . Closed tibia fracture 10/29/2016  . Closed fracture of right tibial plafond with fibula involvement 10/29/2016  . Abnormal liver function tests 03/06/2013  . RASH AND OTHER NONSPECIFIC SKIN ERUPTION 10/07/2009  . SHOULDER PAIN, LEFT 12/27/2008  . Type I (juvenile type) diabetes mellitus without mention of complication, uncontrolled 12/03/2007  . HYPOTHYROIDISM 11/12/2007  . HYPERCHOLESTEROLEMIA 11/12/2007  . MULTIPLE SCLEROSIS 08/29/2007  . PROLIFERATIVE DIABETIC RETINOPATHY 08/29/2007  . HYPERTENSION 12/25/2006    [name], PT, DPT May 31, 2017    10:55 AM    [facility] [address] [place], [place], [address] Phone: [phone]   Fax:  [phone]  Name: Johnathan Ross MRN: [id number] Date of Birth:  [birth year]

## 2017-05-13 ENCOUNTER — Encounter: Payer: Self-pay | Admitting: Physical Therapy

## 2017-05-13 ENCOUNTER — Ambulatory Visit: Payer: Medicare Other | Admitting: Physical Therapy

## 2017-05-13 DIAGNOSIS — M25671 Stiffness of right ankle, not elsewhere classified: Secondary | ICD-10-CM | POA: Diagnosis not present

## 2017-05-13 DIAGNOSIS — R2681 Unsteadiness on feet: Secondary | ICD-10-CM | POA: Diagnosis not present

## 2017-05-13 DIAGNOSIS — R262 Difficulty in walking, not elsewhere classified: Secondary | ICD-10-CM

## 2017-05-13 DIAGNOSIS — M6281 Muscle weakness (generalized): Secondary | ICD-10-CM

## 2017-05-13 DIAGNOSIS — M25571 Pain in right ankle and joints of right foot: Secondary | ICD-10-CM | POA: Diagnosis not present

## 2017-05-13 NOTE — Therapy (Signed)
[facility] [address] [place], [place], [address] Phone: [phone]   Fax:  [phone]  Physical Therapy Treatment  Patient Details  Name: Johnathan Ross MRN: [id number] Date of Birth: 1970/10/31 Referring Provider: Altamese Wayzata, MD   Encounter Date: 05/13/2017  PT End of Session - 05/13/17 1727    Visit Number  31 next G code due visit 38    Number of Visits  43    Date for PT Re-Evaluation  06/24/17    Authorization Type  Medicare: Kx modifier at visit 15, G-codes and PN every 10th visit (visit 9)    PT Start Time  0846    PT Stop Time  0932    PT Time Calculation (min)  46 min       Past Medical History:  Diagnosis Date  . Hyperlipidemia   . Hypertension   . Hypogonadism in male 11/01/2016  . Hypothyroidism   . Multiple sclerosis ([place])   . 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 Type 1 diabetes mellitus ([place]) dx'd 1994  . Vitamin D insufficiency 11/29/2016    Past Surgical History:  Procedure Laterality Date  . EYE SURGERY Bilateral    "laser for diabetic retinopathy"  . FRACTURE SURGERY    . OPEN REDUCTION INTERNAL FIXATION (ORIF) TIBIA/FIBULA FRACTURE Right 10/30/2016  . ORIF ANKLE FRACTURE Left 2015  . ORIF TIBIA FRACTURE Right 10/30/2016   Procedure: OPEN REDUCTION INTERNAL FIXATION (ORIF) TIBIA FIBULA FRACTURE;  Surgeon: Altamese Yale, MD;  Location: [facility];  Service: Orthopedics;  Laterality: Right;  . RETINAL LASER PROCEDURE Bilateral    "for diabetic retinopathy"    There were no vitals filed for this visit.  Subjective Assessment - 05/13/17 1458    Subjective  Pt asks if we are doing the Bioness today - states "I wore my shorts"    Patient is accompained by:  Family member    Pertinent History  MS dx about 6 years ago    Patient Stated Goals  Walk without AD    Currently in Pain?  No/denies                      [facility] Adult PT Treatment/Exercise - 05/13/17 0856       Ambulation/Gait   Ambulation/Gait  Yes    Ambulation/Gait Assistance  5: Supervision    Ambulation/Gait Assistance Details  cues to increase Rt knee flexion to reduce hyperextension    Ambulation Distance (Feet)  100 Feet    Assistive device  Rolling walker    Gait Pattern  Step-through pattern;Decreased hip/knee flexion - right;Decreased dorsiflexion - right;Right genu recurvatum;Poor foot clearance - right    Ambulation Surface  Level;Indoor      Knee/Hip Exercises: Aerobic   Nustep  Level 4 x 5" with UE's and LE's      Knee/Hip Exercises: Supine   Straight Leg Raises  AROM;Right;1 set;10 reps    Other Supine Knee/Hip Exercises  Rt hip extension control exercise off side of mat - 3# weight used ; 10 reps       Knee/Hip Exercises: Sidelying   Hip ABduction  AROM;Right;1 set;10 reps      Knee/Hip Exercises: Prone   Hamstring Curl  1 set;10 reps RLE - cues for eccentric control; 2# weight used    Other Prone Exercises  Rt hip extension with knee flexed at 90 degrees      Ankle Exercises: Standing   Heel Raises  10  reps    Other Standing Ankle Exercises  RLE unilateral heel raise 2 sets 10 reps      Neuro Re-ed:  Tall kneeling - squats x 10 reps; Rt 1/2 kneeling for trunk stabilization; pt performed alternate UE flexion 5 reps each; head turns  Horizontally 5 reps with CGA (green physioball in front for UE support prn)   Quadruped position - lifting opposite UE/LE x 3 reps each with 3-4 sec hold:  Pt performed Rt knee flexion/extension with Rt hip extended in this position - with min To mod assist  Pt performed forward step ups onto 6" step with RLE x 10 reps Lateral step ups onto 6" step with RLE x 10 reps with UE support  Slow tap ups with LLE x 10 reps with mod tactile cues for Rt knee control - to reduce hyperextension in stance;         PT Short Term Goals - 04/28/17 1433      PT SHORT TERM GOAL #1   Title  Pt will participate in further assessment for  appropriate AFO (no AFO, current off the shelf AFO, or new AFO?) to decrease risk for falls.    Baseline  has low off the shelf AFO that only prevents PF during swing phase; currently not the most appropriate    Status  New    Target Date  05/25/17      PT SHORT TERM GOAL #2   Title  Pt will ambulate 250' on level surfaces with cane with min A    Baseline  Ambulated 230' on level surface with cane with tripod tip with Min guard to Min Assist, 6-8 standing rest breaks    Status  Revised    Target Date  05/25/17      PT SHORT TERM GOAL #3   Title  Pt will improve R ankle DF ROM to >12 deg and increase PF >40 deg    Baseline  11/29: R ankle DF 11 deg, PF 40 deg     Status  Revised    Target Date  05/25/17      PT SHORT TERM GOAL #4   Title  Pt will demonstrate improved LE strength as indicated by decrease in five time sit to stand < or = 12 seconds with one UE support    Baseline  12.88 seconds    Status  Revised    Target Date  05/25/17      PT SHORT TERM GOAL #5   Title  Pt will improve standing balance as indicated by increase in BERG balance score to > or = 45/56    Baseline  42/56    Status  Revised    Target Date  05/25/17      PT SHORT TERM GOAL #6   Title  Pt will improve safety with gait as indicated by increase in gait velocity to 1.52ft/sec with cane    Baseline  3.08 ft/sec with RW; TBA with cane    Status  Revised    Target Date  05/25/17        PT Long Term Goals - 04/25/17 1210      PT LONG TERM GOAL #1   Title  Pt will ambulate 300' on a variety of level indoor and outdoor paved surfaces and will negotiate 5 stairs with one rail, with cane/most appropriate AFO MOD I    Baseline  11/29: Straight cane with tripod tip for 230' with Min Assist needed, is walking  short distances (~100-200 feet) with RW with supervision, taking rest breaks as needed (standing rest breaks)    Time  --    Period  --    Status  Revised    Target Date  06/24/17      PT LONG TERM GOAL  #2   Title  --    Baseline  --    Time  --    Period  --    Status  --      PT LONG TERM GOAL #3   Title  Pt will improve LE strength as indicated by improvement in five time sit to stand to < or = 10 seconds    Baseline  04/22/17: 12.88 sec    Time  --    Period  --    Status  Revised    Target Date  06/24/17      PT LONG TERM GOAL #4   Title  --    Baseline  --    Time  --    Period  --    Status  --      PT LONG TERM GOAL #5   Title  Pt will decrease falls risk as indicated by increase in BERG score to > or = 48/56    Baseline  04/22/17: 42/56    Time  --    Period  --    Status  Revised    Target Date  06/24/17      PT LONG TERM GOAL #6   Title  Pt will demonstrate ability to perform standing balance and strengthening HEP safely MOD I    Baseline  04/25/17: continues to need cues/assist to decrease compensatory movements    Time  --    Period  --    Status  Revised    Target Date  06/24/17      PT LONG TERM GOAL #7   Title  Pt will improve gait velocity to > or = 2.6 ft/sec with cane and appropriate AFO     Baseline  04/22/17: 3.08 ft/sec with RW, cane TBD    Time  --    Period  --    Status  Revised    Target Date  06/24/17            Plan - 05/13/17 1729    Clinical Impression Statement  Pt has Rt knee hyperextension but is able to control somewhat with focus and concentration on gait pattern to keep Rt knee slightly flexed in stance. Pt fatigues quickly with exercises due to decreased muscle endurance - needs frequent short rest periods.     Rehab Potential  Good    Clinical Impairments Affecting Rehab Potential  MS     PT Frequency  2x / week    PT Duration  8 weeks    PT Treatment/Interventions  ADLs/Self Care Home Management;Cryotherapy;Vasopneumatic Device;Taping;Gait training;Stair training;Functional mobility training;Therapeutic activities;Therapeutic exercise;Balance training;Neuromuscular re-education;Passive range of motion;Manual  techniques;Patient/family education;Electrical Stimulation;DME Instruction;Orthotic Fit/Training;Energy conservation    PT Next Visit Plan  Bioness gait training with RW and with cane; TRAINING MODE FOR ANKLE DF AND HAMSTRING STRENGTHENING.  FOLLOW UP ON DISCUSSION ABOUT AFO; Focus on R hip closed chain hip ABD and trunk control training, LE strengthening especially hamstring and hip flexor, single limb stance and proximal hip stability, trunk/postural control, rocker board for ankle ROM and strength- monitor HR and fatigue level    Consulted and Agree with Plan of Care  Patient  Family Member Consulted  mom       Patient will benefit from skilled therapeutic intervention in order to improve the following deficits and impairments:  Abnormal gait, Pain, Decreased range of motion, Decreased balance, Decreased mobility, Difficulty walking, Decreased activity tolerance, Decreased strength, Decreased endurance, Impaired flexibility  Visit Diagnosis: Muscle weakness (generalized)  Difficulty in walking, not elsewhere classified     Problem List Patient Active Problem List   Diagnosis Date Noted  . Vitamin D insufficiency 11/29/2016  . Hypogonadism in male 11/01/2016  . Type 1 diabetes mellitus ([place])   . Hypothyroidism   . Hypertension   . Hyperlipidemia   . Closed fracture of right fibula and tibia 10/29/2016  . Closed tibia fracture 10/29/2016  . Closed fracture of right tibial plafond with fibula involvement 10/29/2016  . Abnormal liver function tests 03/06/2013  . RASH AND OTHER NONSPECIFIC SKIN ERUPTION 10/07/2009  . SHOULDER PAIN, LEFT 12/27/2008  . Type I (juvenile type) diabetes mellitus without mention of complication, uncontrolled 12/03/2007  . HYPOTHYROIDISM 11/12/2007  . HYPERCHOLESTEROLEMIA 11/12/2007  . MULTIPLE SCLEROSIS 08/29/2007  . PROLIFERATIVE DIABETIC RETINOPATHY 08/29/2007  . HYPERTENSION 12/25/2006    Alda Lea, PT 05/13/2017, 5:35 PM  [facility] [address] [place], [place], [address] Phone: [phone]   Fax:  [phone]  Name: Johnathan Ross MRN: [id number] Date of Birth: 04-Jun-1970

## 2017-05-16 ENCOUNTER — Telehealth: Payer: Self-pay | Admitting: Endocrinology

## 2017-05-16 ENCOUNTER — Encounter: Payer: Self-pay | Admitting: Physical Therapy

## 2017-05-16 ENCOUNTER — Ambulatory Visit: Payer: Medicare Other | Admitting: Physical Therapy

## 2017-05-16 DIAGNOSIS — R262 Difficulty in walking, not elsewhere classified: Secondary | ICD-10-CM

## 2017-05-16 DIAGNOSIS — R2681 Unsteadiness on feet: Secondary | ICD-10-CM

## 2017-05-16 DIAGNOSIS — M25671 Stiffness of right ankle, not elsewhere classified: Secondary | ICD-10-CM

## 2017-05-16 DIAGNOSIS — M6281 Muscle weakness (generalized): Secondary | ICD-10-CM | POA: Diagnosis not present

## 2017-05-16 DIAGNOSIS — M25571 Pain in right ankle and joints of right foot: Secondary | ICD-10-CM | POA: Diagnosis not present

## 2017-05-16 NOTE — Telephone Encounter (Signed)
error 

## 2017-05-16 NOTE — Therapy (Signed)
[facility] [address] [place], [place], [address] Phone: [phone]   Fax:  [phone]  Physical Therapy Treatment  Patient Details  Name: Johnathan Ross MRN: [id number] Date of Birth: 03/03/1971 Referring Provider: Altamese Collins, MD   Encounter Date: 05/16/2017  PT End of Session - 05/16/17 2001    Visit Number  32 next G code due visit 38    Number of Visits  43    Date for PT Re-Evaluation  06/24/17    Authorization Type  Medicare: Kx modifier at visit 15, G-codes and PN every 10th visit (visit 79)    PT Start Time  0847    PT Stop Time  0931    PT Time Calculation (min)  44 min    Activity Tolerance  Patient tolerated treatment well    Behavior During Therapy  [facility] for tasks assessed/performed       Past Medical History:  Diagnosis Date  . Hyperlipidemia   . Hypertension   . Hypogonadism in male 11/01/2016  . Hypothyroidism   . Multiple sclerosis ([place])   . 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 Type 1 diabetes mellitus ([place]) dx'd 1994  . Vitamin D insufficiency 11/29/2016    Past Surgical History:  Procedure Laterality Date  . EYE SURGERY Bilateral    "laser for diabetic retinopathy"  . FRACTURE SURGERY    . OPEN REDUCTION INTERNAL FIXATION (ORIF) TIBIA/FIBULA FRACTURE Right 10/30/2016  . ORIF ANKLE FRACTURE Left 2015  . ORIF TIBIA FRACTURE Right 10/30/2016   Procedure: OPEN REDUCTION INTERNAL FIXATION (ORIF) TIBIA FIBULA FRACTURE;  Surgeon: Altamese Bennington, MD;  Location: [facility];  Service: Orthopedics;  Laterality: Right;  . RETINAL LASER PROCEDURE Bilateral    "for diabetic retinopathy"    There were no vitals filed for this visit.  Subjective Assessment - 05/16/17 1950    Subjective  Wore shorts again for Bioness.  Feels like both legs are "popping back" now.      Patient is accompained by:  Family member    Pertinent History  MS dx about 6 years ago    Patient Stated Goals  Walk  without AD    Currently in Pain?  No/denies                      [facility] Adult PT Treatment/Exercise - 05/16/17 1951      Ambulation/Gait   Ambulation/Gait  Yes    Ambulation/Gait Assistance  5: Supervision    Ambulation/Gait Assistance Details  with use of BIONESS in // bars but with focus on ambulating forwards and backwards without UE support with BIONESS in gait mode facilitating open chain ankle DF, hamstring flexion during swing phase, close chain HS and ankle DF activation in stance for increased knee control, LE extension and tibial translation over BOS.    Ambulation Distance (Feet)  40 Feet    Assistive device  Parallel bars intermittent UE support; BIONESS    Gait Pattern  Step-through pattern;Decreased stride length;Decreased weight shift to right    Ambulation Surface  Level;Indoor    Stairs  Yes    Stairs Assistance  5: Supervision    Stairs Assistance Details (indicate cue type and reason)  with BIONESS on RLE in training mode for increased knee and ankle activation and stabilization during stair negotiation    Stair Management Technique  Two rails;Alternating pattern;Forwards;Backwards    Number of Stairs  12    Height of Stairs  6  Pre-Gait Activities  Use of BIONESS and performing blocked practice timed step ups and step downs with mm contraction 8 seconds on, 10 seconds off with focus on forward weight shift, full LE clearance to next step and knee control in stance. Performed stairs backwards for increased hamstring activation training      Modalities   Modalities  Electrical Stimulation      Electrical Stimulation   Electrical Stimulation Location  functional estim to R anterior tibialis and R hamstring mm groups    Electrical Stimulation Action  open and closed chain knee flexion, hip extension and ankle DF during various tasks    Electrical Stimulation Parameters  see Bioness tablet 1; hamstring thigh cuff and lower cuff with steering electrodes     Electrical Stimulation Goals  Strength;Neuromuscular facilitation      Ankle Exercises: Standing   Rocker Board  2 minutes;Other (comment) in // bars; BIONESS for ankle DF and HS stimulation          Balance Exercises - 05/16/17 2000      Balance Exercises: Standing   SLS  Eyes open;Upper extremity support 2 on rockerboard with BIONESS for RLE activation          PT Short Term Goals - 04/28/17 1433      PT SHORT TERM GOAL #1   Title  Pt will participate in further assessment for appropriate AFO (no AFO, current off the shelf AFO, or new AFO?) to decrease risk for falls.    Baseline  has low off the shelf AFO that only prevents PF during swing phase; currently not the most appropriate    Status  New    Target Date  05/25/17      PT SHORT TERM GOAL #2   Title  Pt will ambulate 250' on level surfaces with cane with min A    Baseline  Ambulated 230' on level surface with cane with tripod tip with Min guard to Min Assist, 6-8 standing rest breaks    Status  Revised    Target Date  05/25/17      PT SHORT TERM GOAL #3   Title  Pt will improve R ankle DF ROM to >12 deg and increase PF >40 deg    Baseline  11/29: R ankle DF 11 deg, PF 40 deg     Status  Revised    Target Date  05/25/17      PT SHORT TERM GOAL #4   Title  Pt will demonstrate improved LE strength as indicated by decrease in five time sit to stand < or = 12 seconds with one UE support    Baseline  12.88 seconds    Status  Revised    Target Date  05/25/17      PT SHORT TERM GOAL #5   Title  Pt will improve standing balance as indicated by increase in BERG balance score to > or = 45/56    Baseline  42/56    Status  Revised    Target Date  05/25/17      PT SHORT TERM GOAL #6   Title  Pt will improve safety with gait as indicated by increase in gait velocity to 1.26ft/sec with cane    Baseline  3.08 ft/sec with RW; TBA with cane    Status  Revised    Target Date  05/25/17        PT Long Term Goals -  04/25/17 1210      PT LONG  TERM GOAL #1   Title  Pt will ambulate 300' on a variety of level indoor and outdoor paved surfaces and will negotiate 5 stairs with one rail, with cane/most appropriate AFO MOD I    Baseline  11/29: Straight cane with tripod tip for 230' with Min Assist needed, is walking short distances (~100-200 feet) with RW with supervision, taking rest breaks as needed (standing rest breaks)    Time  --    Period  --    Status  Revised    Target Date  06/24/17      PT LONG TERM GOAL #2   Title  --    Baseline  --    Time  --    Period  --    Status  --      PT LONG TERM GOAL #3   Title  Pt will improve LE strength as indicated by improvement in five time sit to stand to < or = 10 seconds    Baseline  04/22/17: 12.88 sec    Time  --    Period  --    Status  Revised    Target Date  06/24/17      PT LONG TERM GOAL #4   Title  --    Baseline  --    Time  --    Period  --    Status  --      PT LONG TERM GOAL #5   Title  Pt will decrease falls risk as indicated by increase in BERG score to > or = 48/56    Baseline  04/22/17: 42/56    Time  --    Period  --    Status  Revised    Target Date  06/24/17      PT LONG TERM GOAL #6   Title  Pt will demonstrate ability to perform standing balance and strengthening HEP safely MOD I    Baseline  04/25/17: continues to need cues/assist to decrease compensatory movements    Time  --    Period  --    Status  Revised    Target Date  06/24/17      PT LONG TERM GOAL #7   Title  Pt will improve gait velocity to > or = 2.6 ft/sec with cane and appropriate AFO     Baseline  04/22/17: 3.08 ft/sec with RW, cane TBD    Time  --    Period  --    Status  Revised    Target Date  06/24/17            Plan - 05/16/17 2001    Clinical Impression Statement  Continued NMR with BIONESS on RLE during gait, balance and stair negotiation training.  Pt demonstrating improved stability and foot clearance during stair negotiation  and improved balance during gait with decreased UE support with use of Bioness.  Will continue to progress towards LTG.    Rehab Potential  Good    Clinical Impairments Affecting Rehab Potential  MS     PT Frequency  2x / week    PT Duration  8 weeks    PT Treatment/Interventions  ADLs/Self Care Home Management;Cryotherapy;Vasopneumatic Device;Taping;Gait training;Stair training;Functional mobility training;Therapeutic activities;Therapeutic exercise;Balance training;Neuromuscular re-education;Passive range of motion;Manual techniques;Patient/family education;Electrical Stimulation;DME Instruction;Orthotic Fit/Training;Energy conservation    PT Next Visit Plan  STG due by 12/29.  Bioness training with RW, cane or in // bars - no AD; TRAINING MODE FOR ANKLE DF AND  HAMSTRING STRENGTHENING.  FOLLOW UP ON DISCUSSION ABOUT AFO; Focus on R hip closed chain hip ABD and trunk control training, LE strengthening especially hamstring and hip flexor, single limb stance and proximal hip stability, trunk/postural control, rocker board for ankle ROM and strength- monitor HR and fatigue level    Consulted and Agree with Plan of Care  Patient    Family Member Consulted  mom       Patient will benefit from skilled therapeutic intervention in order to improve the following deficits and impairments:  Abnormal gait, Pain, Decreased range of motion, Decreased balance, Decreased mobility, Difficulty walking, Decreased activity tolerance, Decreased strength, Decreased endurance, Impaired flexibility  Visit Diagnosis: Muscle weakness (generalized)  Unsteadiness on feet  Difficulty in walking, not elsewhere classified  Stiffness of right ankle, not elsewhere classified     Problem List Patient Active Problem List   Diagnosis Date Noted  . Vitamin D insufficiency 11/29/2016  . Hypogonadism in male 11/01/2016  . Type 1 diabetes mellitus ([place])   . Hypothyroidism   . Hypertension   . Hyperlipidemia   . Closed  fracture of right fibula and tibia 10/29/2016  . Closed tibia fracture 10/29/2016  . Closed fracture of right tibial plafond with fibula involvement 10/29/2016  . Abnormal liver function tests 03/06/2013  . RASH AND OTHER NONSPECIFIC SKIN ERUPTION 10/07/2009  . SHOULDER PAIN, LEFT 12/27/2008  . Type I (juvenile type) diabetes mellitus without mention of complication, uncontrolled 12/03/2007  . HYPOTHYROIDISM 11/12/2007  . HYPERCHOLESTEROLEMIA 11/12/2007  . MULTIPLE SCLEROSIS 08/29/2007  . PROLIFERATIVE DIABETIC RETINOPATHY 08/29/2007  . HYPERTENSION 12/25/2006    [name], PT, DPT 05/16/17    8:07 PM    [facility] [address] [place], [place], [address] Phone: [phone]   Fax:  [phone]  Name: PATTY LOPEZGARCIA MRN: [id number] Date of Birth: Oct 24, 1970

## 2017-05-23 ENCOUNTER — Encounter: Payer: Self-pay | Admitting: Physical Therapy

## 2017-05-23 ENCOUNTER — Ambulatory Visit: Payer: Medicare Other | Admitting: Physical Therapy

## 2017-05-23 DIAGNOSIS — M25671 Stiffness of right ankle, not elsewhere classified: Secondary | ICD-10-CM | POA: Diagnosis not present

## 2017-05-23 DIAGNOSIS — M25571 Pain in right ankle and joints of right foot: Secondary | ICD-10-CM | POA: Diagnosis not present

## 2017-05-23 DIAGNOSIS — R2681 Unsteadiness on feet: Secondary | ICD-10-CM

## 2017-05-23 DIAGNOSIS — M6281 Muscle weakness (generalized): Secondary | ICD-10-CM | POA: Diagnosis not present

## 2017-05-23 DIAGNOSIS — R262 Difficulty in walking, not elsewhere classified: Secondary | ICD-10-CM | POA: Diagnosis not present

## 2017-05-23 NOTE — Therapy (Signed)
[facility] [address] [place], [place], [address] Phone: [phone]   Fax:  [phone]  Physical Therapy Treatment  Patient Details  Name: Johnathan Ross MRN: [id number] Date of Birth: 02/05/1971 Referring Provider: Michael Handy, MD   Encounter Date: 05/23/2017  PT End of Session - 05/23/17 0937    Visit Number  33 next G code due visit 38    Number of Visits  43    Date for PT Re-Evaluation  06/24/17    Authorization Type  Medicare: Kx modifier at visit 15, G-codes and PN every 10th visit (visit 38)    PT Start Time  0933    PT Stop Time  1016    PT Time Calculation (min)  43 min    Equipment Utilized During Treatment  Gait belt    Activity Tolerance  Patient tolerated treatment well;Patient limited by fatigue    Behavior During Therapy  WFL for tasks assessed/performed       Past Medical History:  Diagnosis Date  . Hyperlipidemia   . Hypertension   . Hypogonadism in male 11/01/2016  . Hypothyroidism   . Multiple sclerosis (HCC)   . 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 Type 1 diabetes mellitus (HCC) dx'd 1994  . Vitamin D insufficiency 11/29/2016    Past Surgical History:  Procedure Laterality Date  . EYE SURGERY Bilateral    "laser for diabetic retinopathy"  . FRACTURE SURGERY    . OPEN REDUCTION INTERNAL FIXATION (ORIF) TIBIA/FIBULA FRACTURE Right 10/30/2016  . ORIF ANKLE FRACTURE Left 2015  . ORIF TIBIA FRACTURE Right 10/30/2016   Procedure: OPEN REDUCTION INTERNAL FIXATION (ORIF) TIBIA FIBULA FRACTURE;  Surgeon: Handy, Michael, MD;  Location: [facility];  Service: Orthopedics;  Laterality: Right;  . RETINAL LASER PROCEDURE Bilateral    "for diabetic retinopathy"    There were no vitals filed for this visit.  Subjective Assessment - 05/23/17 0937    Subjective  No new complaints.  No falls or pain to report.     Patient is accompained by:  Family member    Pertinent History  MS dx about  6 years ago    How long can you sit comfortably?  unlimited    How long can you stand comfortably?  unlimited    How long can you walk comfortably?  household distances due to LE fatigue/weakness    Diagnostic tests  X-ray    Patient Stated Goals  Walk without AD    Currently in Pain?  No/denies         [facility] PT Assessment - 05/23/17 0942      ROM / Strength   AROM / PROM / Strength  AROM;PROM      AROM   AROM Assessment Site  Ankle    Right/Left Ankle  Right    Right Ankle Dorsiflexion  -6    Right Ankle Plantar Flexion  38      PROM   PROM Assessment Site  Ankle    Right/Left Ankle  Right    Right Ankle Dorsiflexion  15    Right Ankle Plantar Flexion  55      Standardized Balance Assessment   Standardized Balance Assessment  Berg Balance Test;Five Times Sit to Stand;10 meter walk test    Five times sit to stand comments   10.60 seconds with UE support using standard height chair    10 Meter Walk  13.91 sec's= 2.36 ft/sec with RW, no rest breaks needed   this test      Berg Balance Test   Sit to Stand  Able to stand without using hands and stabilize independently    Standing Unsupported  Able to stand safely 2 minutes    Sitting with Back Unsupported but Feet Supported on Floor or Stool  Able to sit safely and securely 2 minutes    Stand to Sit  Sits safely with minimal use of hands    Transfers  Able to transfer safely, minor use of hands    Standing Unsupported with Eyes Closed  Able to stand 10 seconds with supervision    Standing Ubsupported with Feet Together  Needs help to attain position but able to stand for 30 seconds with feet together    From Standing, Reach Forward with Outstretched Arm  Can reach confidently >25 cm (10")    From Standing Position, Pick up Object from Floor  Able to pick up shoe safely and easily    From Standing Position, Turn to Look Behind Over each Shoulder  Looks behind one side only/other side shows less weight shift right > left    Turn  360 Degrees  Able to turn 360 degrees safely but slowly    Standing Unsupported, Alternately Place Feet on Step/Stool  Able to complete 4 steps without aid or supervision    Standing Unsupported, One Foot in Front  Able to plae foot ahead of the other independently and hold 30 seconds    Standing on One Leg  Able to lift leg independently and hold equal to or more than 3 seconds    Total Score  44    Berg comment:  44/56         PT Short Term Goals - 05/23/17 0939      PT SHORT TERM GOAL #1   Title  Pt will participate in further assessment for appropriate AFO (no AFO, current off the shelf AFO, or new AFO?) to decrease risk for falls.    Baseline  05/23/17: pt still is not fully commited to an AFO. Have been trialing Bioness in past few sessions.    Status  Partially Met      PT SHORT TERM GOAL #2   Title  Pt will ambulate 250' on level surfaces with cane with min A    Baseline  Ambulated 230' on level surface with cane with tripod tip with Min guard to Min Assist, 6-8 standing rest breaks    Status  On-going      PT SHORT TERM GOAL #3   Title  Pt will improve R ankle DF ROM to >12 deg and increase PF >40 deg    Baseline  05/23/17: right ankle AROM -6 DF, 38 PF, PROM 15 DF,  55 PF.  met goals with PROM only    Status  Achieved      PT SHORT TERM GOAL #4   Title  Pt will demonstrate improved LE strength as indicated by decrease in five time sit to stand < or = 12 seconds with one UE support    Baseline  05/23/17: 10.60 sec's with UE support from standard height chair    Status  Achieved      PT SHORT TERM GOAL #5   Title  Pt will improve standing balance as indicated by increase in BERG balance score to > or = 45/56    Baseline  05/23/17: 44/56 scored today, improved from 42/56 just not to goal    Status  Partially  Met      PT SHORT TERM GOAL #6   Title  Pt will improve safety with gait as indicated by increase in gait velocity to 1.8ft/sec with cane    Baseline  05/23/17:  2.36 ft/sec with RW (was 3.08 ft/sec). not tested with cane to date.    Status  On-going        PT Long Term Goals - 04/25/17 1210      PT LONG TERM GOAL #1   Title  Pt will ambulate 300' on a variety of level indoor and outdoor paved surfaces and will negotiate 5 stairs with one rail, with cane/most appropriate AFO MOD I    Baseline  11/29: Straight cane with tripod tip for 230' with Min Assist needed, is walking short distances (~100-200 feet) with RW with supervision, taking rest breaks as needed (standing rest breaks)    Time  --    Period  --    Status  Revised    Target Date  06/24/17      PT LONG TERM GOAL #2   Title  --    Baseline  --    Time  --    Period  --    Status  --      PT LONG TERM GOAL #3   Title  Pt will improve LE strength as indicated by improvement in five time sit to stand to < or = 10 seconds    Baseline  04/22/17: 12.88 sec    Time  --    Period  --    Status  Revised    Target Date  06/24/17      PT LONG TERM GOAL #4   Title  --    Baseline  --    Time  --    Period  --    Status  --      PT LONG TERM GOAL #5   Title  Pt will decrease falls risk as indicated by increase in BERG score to > or = 48/56    Baseline  04/22/17: 42/56    Time  --    Period  --    Status  Revised    Target Date  06/24/17      PT LONG TERM GOAL #6   Title  Pt will demonstrate ability to perform standing balance and strengthening HEP safely MOD I    Baseline  04/25/17: continues to need cues/assist to decrease compensatory movements    Time  --    Period  --    Status  Revised    Target Date  06/24/17      PT LONG TERM GOAL #7   Title  Pt will improve gait velocity to > or = 2.6 ft/sec with cane and appropriate AFO     Baseline  04/22/17: 3.08 ft/sec with RW, cane TBD    Time  --    Period  --    Status  Revised    Target Date  06/24/17         Plan - 05/23/17 0937    Clinical Impression Statement  Today's skilled session focused on progress toward  STGs with pt meeting his 5 time sit to stand and right ankle ROM goal's. He has improved his Berg Balance test score, just not to goal. Will check remaining goals at next session: gait speed with cane and gait distance with gait.     Rehab Potential  Good    Clinical   Impairments Affecting Rehab Potential  MS     PT Frequency  2x / week    PT Duration  8 weeks    PT Treatment/Interventions  ADLs/Self Care Home Management;Cryotherapy;Vasopneumatic Device;Taping;Gait training;Stair training;Functional mobility training;Therapeutic activities;Therapeutic exercise;Balance training;Neuromuscular re-education;Passive range of motion;Manual techniques;Patient/family education;Electrical Stimulation;DME Instruction;Orthotic Fit/Training;Energy conservation    PT Next Visit Plan  check remaining STGs; Bioness training with RW, cane or in // bars - no AD; TRAINING MODE FOR ANKLE DF AND HAMSTRING STRENGTHENING.  FOLLOW UP ON DISCUSSION ABOUT AFO; Focus on R hip closed chain hip ABD and trunk control training, LE strengthening especially hamstring and hip flexor, single limb stance and proximal hip stability, trunk/postural control, rocker board for ankle ROM and strength- monitor HR and fatigue level    PT Home Exercise Plan  Pt has program at home for strengthening and balance    Consulted and Agree with Plan of Care  Patient    Family Member Consulted  mom       Patient will benefit from skilled therapeutic intervention in order to improve the following deficits and impairments:  Abnormal gait, Pain, Decreased range of motion, Decreased balance, Decreased mobility, Difficulty walking, Decreased activity tolerance, Decreased strength, Decreased endurance, Impaired flexibility  Visit Diagnosis: Muscle weakness (generalized)  Unsteadiness on feet  Difficulty in walking, not elsewhere classified     Problem List Patient Active Problem List   Diagnosis Date Noted  . Vitamin D insufficiency 11/29/2016   . Hypogonadism in male 11/01/2016  . Type 1 diabetes mellitus (HCC)   . Hypothyroidism   . Hypertension   . Hyperlipidemia   . Closed fracture of right fibula and tibia 10/29/2016  . Closed tibia fracture 10/29/2016  . Closed fracture of right tibial plafond with fibula involvement 10/29/2016  . Abnormal liver function tests 03/06/2013  . RASH AND OTHER NONSPECIFIC SKIN ERUPTION 10/07/2009  . SHOULDER PAIN, LEFT 12/27/2008  . Type I (juvenile type) diabetes mellitus without mention of complication, uncontrolled 12/03/2007  . HYPOTHYROIDISM 11/12/2007  . HYPERCHOLESTEROLEMIA 11/12/2007  . MULTIPLE SCLEROSIS 08/29/2007  . PROLIFERATIVE DIABETIC RETINOPATHY 08/29/2007  . HYPERTENSION 12/25/2006    Kathy Bury, PTA, CLT [facility] [address], [address] [place], [place] [address] [phone] 05/23/17, 8:10 PM  Name: Johnathan Ross MRN: [id number] Date of Birth: 12/01/1970   

## 2017-05-24 ENCOUNTER — Encounter: Payer: Medicare Other | Admitting: Psychology

## 2017-05-24 ENCOUNTER — Other Ambulatory Visit (INDEPENDENT_AMBULATORY_CARE_PROVIDER_SITE_OTHER): Payer: Medicare Other

## 2017-05-24 DIAGNOSIS — E1065 Type 1 diabetes mellitus with hyperglycemia: Secondary | ICD-10-CM | POA: Diagnosis not present

## 2017-05-24 LAB — COMPREHENSIVE METABOLIC PANEL
ALT: 22 U/L (ref 0–53)
AST: 23 U/L (ref 0–37)
Albumin: 4.4 g/dL (ref 3.5–5.2)
Alkaline Phosphatase: 109 U/L (ref 39–117)
BUN: 18 mg/dL (ref 6–23)
CO2: 31 mEq/L (ref 19–32)
Calcium: 9.2 mg/dL (ref 8.4–10.5)
Chloride: 100 mEq/L (ref 96–112)
Creatinine, Ser: 1.24 mg/dL (ref 0.40–1.50)
GFR: 81.15 mL/min (ref >60.00)
Glucose, Bld: 238 mg/dL — ABNORMAL HIGH (ref 70–99)
Potassium: 4.7 mEq/L (ref 3.5–5.1)
Sodium: 137 mEq/L (ref 135–145)
Total Bilirubin: 0.5 mg/dL (ref 0.2–1.2)
Total Protein: 7.3 g/dL (ref 6.0–8.3)

## 2017-05-24 LAB — T4, FREE: Free T4: 0.88 ng/dL (ref 0.60–1.60)

## 2017-05-24 LAB — TSH: TSH: 5.56 u[IU]/mL — ABNORMAL HIGH (ref 0.35–4.50)

## 2017-05-25 LAB — FRUCTOSAMINE: Fructosamine: 333 umol/L — ABNORMAL HIGH (ref 0–285)

## 2017-05-27 ENCOUNTER — Encounter: Payer: Self-pay | Admitting: Endocrinology

## 2017-05-27 ENCOUNTER — Ambulatory Visit (INDEPENDENT_AMBULATORY_CARE_PROVIDER_SITE_OTHER): Payer: Medicare Other | Admitting: Endocrinology

## 2017-05-27 ENCOUNTER — Telehealth: Payer: Self-pay | Admitting: Endocrinology

## 2017-05-27 VITALS — BP 132/76 | HR 85 | Ht 73.0 in | Wt 211.2 lb

## 2017-05-27 DIAGNOSIS — E7800 Pure hypercholesterolemia, unspecified: Secondary | ICD-10-CM

## 2017-05-27 DIAGNOSIS — E063 Autoimmune thyroiditis: Secondary | ICD-10-CM | POA: Diagnosis not present

## 2017-05-27 DIAGNOSIS — E038 Other specified hypothyroidism: Secondary | ICD-10-CM | POA: Diagnosis not present

## 2017-05-27 DIAGNOSIS — E1065 Type 1 diabetes mellitus with hyperglycemia: Secondary | ICD-10-CM | POA: Diagnosis not present

## 2017-05-27 MED ORDER — LEVOTHYROXINE SODIUM 175 MCG PO TABS: 175.0000 ug | ORAL_TABLET | Freq: Every day | ORAL | 3 refills | Status: DC

## 2017-05-27 NOTE — Telephone Encounter (Signed)
Can you send in new prescription for the patient? Thank you!

## 2017-05-27 NOTE — Telephone Encounter (Signed)
New Synthroid 175 g prescription was sent.  Does he confirm that he has been taking 150 g strength recently?

## 2017-05-27 NOTE — Telephone Encounter (Signed)
Pt called about a new script that was suppose to be sent to pharmacy this morning. Pharmacy still hasnt seen script   [id number] - , [place] [address]

## 2017-05-27 NOTE — Progress Notes (Signed)
Subjective:              Patient ID: Johnathan Ross, male   DOB: Sep 19, 1970, 46 y.o.   MRN: [id number]  Diabetes    Diagnosis: Type 1 diabetes mellitus, date of diagnosis: 1995.   Insulin Pump followup:   CURRENT brand:  Medtronic  630 G   HISTORY: An insulin pump has been in use since 03/13/11.   PUMP settings are basal rate:   Basal rate at 11 AM = 1.7 and from 12 noon until 3 PM = 1.6    1.5 until 4 a.m; 4 AM = 1.35. 6 a.m.= 1.7. 9 AM = 1.4,11 a.m. = 0.9. 12 noon = 1.2, 2 p.m. = 1. 45. 3 PM = 2.15 and 6 p.m. = 1.8   Carbohydrate to insulin ratio: 1: 10, at 11 a.m. = 1: 10, after 6 p.m. = 1:7, hyperglycemia correction factor: 1: 30,and after 10 p.m. 1:30, target 120, active insulin time 4 hours.  Changing infusion set every 3-4 days  Recent history:  His A1c was last 7.4 Fructosamine is 333, previously 331  Current blood sugar patterns Evaluated from freestyle Libre and pump download, management and problems identified:   Blood sugar analysis from CGM is available only for about 10 days recently  He is also checking his blood sugars on an average up to 4 times a day  AVERAGE blood sugar at home with the sensor is 158 but standard deviation is 72  Blood sugars are within target 63% of the time, high 30% of the time and LOW 7% of the time   HIGHEST blood sugars are in the early afternoon starting at about noon and usually not coming down until late evening  However has marked variability in his blood sugars midday and afternoon; this is despite increasing his basal rate midday and afternoon as on the last visit  Again he is periodically having high fat and high carbohydrate meals without any extra insulin to cover these meals; he does not think extending to bolus for 3 hours makes a difference in his control  Highest blood sugars are midday and afternoon from about 1 PM until 6 PM  His diet and mealtimes are inconsistent and may not eat breakfast on most  days  He thinks he is bolusing BEFORE his meal consistently  Currently not active at all because of weakness, getting physical therapy  FASTING blood sugars recently have been variable with a couple of good readings but otherwise high  HYPOGLYCEMIA has been not documented much but he thinks his blood sugars get low during the night and for the last few days has use a temporary basal of 70% 46 hours starting at 11 PM  With this his blood sugars still then to be relatively low around midnight and the last 3 or 4 days fasting readings have been near normal  He thinks he can detect hypoglycemia symptoms fairly regularly Treatment of hypoglycemia: He is usually drinking juice for this  CGM results:  Mean values apply above for all meters except median for One Touch  PRE-MEAL Fasting Lunch Dinner Bedtime Overall  Glucose range:       Mean/median: 139  142   101  158+/-72    POST-MEAL PC Breakfast PC Lunch PC Dinner  Glucose range:       Mean/median:  214 167    Wt Readings from Last 3 Encounters:  05/27/17 211 lb 3.2 oz (95.8 kg)  03/21/17 211 lb 9.6  oz (96 kg)  12/11/16 210 lb 6.4 oz (95.4 kg)     Lab Results  Component Value Date   HGBA1C 7.4 03/21/2017   HGBA1C 7.6 (H) 12/06/2016   HGBA1C 8.0 (H) 07/06/2016   Lab Results  Component Value Date   MICROALBUR 1.6 10/27/2014   LDLCALC 69 09/07/2016   CREATININE 1.24 05/24/2017     OTHER active problems discussed today: See review of systems    Allergies as of 05/27/2017   No Known Allergies     Medication List        Accurate as of 05/27/17  9:04 AM. Always use your most recent med list.          acetaminophen 500 MG tablet Commonly known as:  TYLENOL Take 2 tablets (1,000 mg total) by mouth every 6 (six) hours.   AMPYRA 10 MG Tb12 Generic drug:  dalfampridine Take 10 mg by mouth 2 (two) times daily. Take one tablet (10 mg) by mouth every morning and mid afternoon   CONTOUR NEXT TEST test  strip Generic drug:  glucose blood USE AS INSTRUCTED TO CHECK BLOOD SUGARS 8 TIMES PER DAY DX CODE E10.65   insulin aspart 100 UNIT/ML injection Commonly known as:  NOVOLOG Use up to 80 units in insulin pump daily-Dx code E10.8   insulin pump Soln Inject into the skin continuous. Novolog   levothyroxine 150 MCG tablet Commonly known as:  SYNTHROID, LEVOTHROID Take 1 tablet (150 mcg total) by mouth daily. Also take extra half tablet weekly.   lisinopril 10 MG tablet Commonly known as:  PRINIVIL,ZESTRIL TAKE 1 TABLET BY MOUTH EVERY DAY   simvastatin 20 MG tablet Commonly known as:  ZOCOR TAKE 1 TABLET BY MOUTH AT BEDTIME       Allergies: No Known Allergies  Past Medical History:  Diagnosis Date  . Hyperlipidemia   . Hypertension   . Hypogonadism in male 11/01/2016  . Hypothyroidism   . Multiple sclerosis ([place])   . 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 Type 1 diabetes mellitus ([facility]) dx'd 1994  . Vitamin D insufficiency 11/29/2016    Past Surgical History:  Procedure Laterality Date  . EYE SURGERY Bilateral    "laser for diabetic retinopathy"  . FRACTURE SURGERY    . OPEN REDUCTION INTERNAL FIXATION (ORIF) TIBIA/FIBULA FRACTURE Right 10/30/2016  . ORIF ANKLE FRACTURE Left 2015  . ORIF TIBIA FRACTURE Right 10/30/2016   Procedure: OPEN REDUCTION INTERNAL FIXATION (ORIF) TIBIA FIBULA FRACTURE;  Surgeon: Altamese Carbon, MD;  Location: [facility];  Service: Orthopedics;  Laterality: Right;  . RETINAL LASER PROCEDURE Bilateral    "for diabetic retinopathy"    Family History  Problem Relation Age of Onset  . Diabetes Sister   . Colon cancer Maternal Grandmother   . Colon polyps Maternal Uncle     Social History:  reports that  has never smoked. he has never used smokeless tobacco. He reports that he does not drink alcohol or use drugs.    Review of Systems      HYPERCHOLESTEROLEMIA: He has been taking simvastatin 20 mg  Liver functions are normal consistently  now LDL is below 70  Lab Results  Component Value Date   CHOL 116 09/07/2016   HDL 38.70 (L) 09/07/2016   LDLCALC 69 09/07/2016   LDLDIRECT 139.7 04/09/2014   TRIG 42.0 09/07/2016   CHOLHDL 3 09/07/2016   Lab Results  Component Value Date   ALT 22 05/24/2017    HYPERTENSION:  has  been  on lisinopril with recently good control  He has MULTIPLE sclerosis, Followed in [place] by neurologist  HYPOTHYROIDISM: Currently on 150 g, 8 tablets weekly  His thyroid requirement has changed periodically and has been taking higher doses in 2018 Since his TSH was low normal in October he was told to take only 7-1/2 tablets a week but is still taking 8 tablets a week and he also thinks he is getting 125 g prescription even though this expired last month    Lab Results  Component Value Date   TSH 5.56 (H) 05/24/2017   TSH 0.78 03/18/2017   TSH 9.07 (H) 12/06/2016   FREET4 0.88 05/24/2017   FREET4 1.16 03/18/2017   FREET4 0.90 08/03/2015        Objective:   Physical Exam  BP 132/76   Pulse 85   Ht 6\' 1"  (1.854 m)   Wt 211 lb 3.2 oz (95.8 kg)   SpO2 98%   BMI 27.86 kg/m          Assessment:      DIABETES type I on insulin pump:  See history of present illness for detailed discussion of current diabetes management, blood sugar patterns and problems identified  He has had difficulty getting consistent control  Fructosamine is 331, high  HIGHEST blood sugars are in the mid day times own with blood sugars usually going up significantly after 12 noon  This is partly related to inadequate coverage of higher fat meals but also probably requires increased basal rate in the early afternoon  Blood sugars are variable after evening meal but better than before and only occasionally low normal May have HYPOGLYCEMIA early morning and late night also, better with his on his own taking a temporary basal overnight for 6 hours  Discussed day-to-day management with insulin pump  with adjustment of boluses and basal rates and better compliance with carbohydrate intake, mealtime boluses and balanced meals with working especially in the morning   HYPOTHYROIDISM:  His thyroid levels have been variable  TSH is now trending higher, he is on an average of about 171 g daily of levothyroxine He thinks he is compliant with his medication  HYPERTENSION: His blood pressure is well controlled      Plan:       INCREASE basal rate as follows: 12 noon = 1.8  REDUCE basal rate at 10 PM down to 1.6, midnight = 1.1 and 4 AM = 1.2  Carbohydrate coverage 1:10 overnight, 8 AM = 8.0, 11 AM = 7.0 and 6 PM = 8.0  He may need to take extra insulin for higher fat meals by adding 20-30 g extra for such meals along with extending for 2 hours  He needs to change the date on his pump which is one day behind  Consistent diet without drinks with sugar  Call if blood sugars not consistently controlled    THYROID: He will need to switch to the new dose of 175 g daily which is a little more than what he is getting now  Follow-up in 2 months for reassessment    There are no Patient Instructions on file for this visit.  Counseling time on subjects discussed in assessment and plan sections is over 50% of today's 25 minute visit    Elayne Snare

## 2017-05-27 NOTE — Addendum Note (Signed)
Addended by: Elayne Snare on: 05/27/2017 12:07 PM   Modules accepted: Orders

## 2017-05-29 ENCOUNTER — Ambulatory Visit: Payer: Medicare Other | Attending: Orthopedic Surgery | Admitting: Physical Therapy

## 2017-05-29 ENCOUNTER — Encounter: Payer: Self-pay | Admitting: Physical Therapy

## 2017-05-29 DIAGNOSIS — R2681 Unsteadiness on feet: Secondary | ICD-10-CM

## 2017-05-29 DIAGNOSIS — M25671 Stiffness of right ankle, not elsewhere classified: Secondary | ICD-10-CM | POA: Diagnosis not present

## 2017-05-29 DIAGNOSIS — R262 Difficulty in walking, not elsewhere classified: Secondary | ICD-10-CM

## 2017-05-29 DIAGNOSIS — M6281 Muscle weakness (generalized): Secondary | ICD-10-CM | POA: Diagnosis not present

## 2017-05-29 NOTE — Telephone Encounter (Signed)
Called patient and left him a vm requesting he call back to confirm he was taking the 150 mcg of levothyroxine prior to last visit

## 2017-05-29 NOTE — Therapy (Signed)
[facility] [address] [place], [place], [address] Phone: [phone]   Fax:  [phone]  Physical Therapy Treatment  Patient Details  Name: Johnathan Ross MRN: [id number] Date of Birth: 12-14-1970 Referring Provider: Altamese [place], MD   Encounter Date: 05/29/2017  PT End of Session - 05/29/17 1706    Visit Number  34 next G code due visit 38    Number of Visits  43    Date for PT Re-Evaluation  06/24/17    Authorization Type  Medicare: Kx modifier     PT Start Time  0930    PT Stop Time  1016    PT Time Calculation (min)  46 min    Activity Tolerance  Patient tolerated treatment well;Patient limited by fatigue    Behavior During Therapy  [facility] for tasks assessed/performed       Past Medical History:  Diagnosis Date  . Hyperlipidemia   . Hypertension   . Hypogonadism in male 11/01/2016  . Hypothyroidism   . Multiple sclerosis ([place])   . 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 Type 1 diabetes mellitus ([place]) dx'd 1994  . Vitamin D insufficiency 11/29/2016    Past Surgical History:  Procedure Laterality Date  . EYE SURGERY Bilateral    "laser for diabetic retinopathy"  . FRACTURE SURGERY    . OPEN REDUCTION INTERNAL FIXATION (ORIF) TIBIA/FIBULA FRACTURE Right 10/30/2016  . ORIF ANKLE FRACTURE Left 2015  . ORIF TIBIA FRACTURE Right 10/30/2016   Procedure: OPEN REDUCTION INTERNAL FIXATION (ORIF) TIBIA FIBULA FRACTURE;  Surgeon: Altamese Sheldon, MD;  Location: [place];  Service: Orthopedics;  Laterality: Right;  . RETINAL LASER PROCEDURE Bilateral    "for diabetic retinopathy"    There were no vitals filed for this visit.  Subjective Assessment - 05/29/17 0938    Subjective  No issues to report; reports he has noticed his L knee popping back but no worse.  Would like to start with cane today but doesn't feel it will be any better.    Patient is accompained by:  Family member    Pertinent History  MS dx  about 6 years ago    How long can you sit comfortably?  unlimited    How long can you stand comfortably?  unlimited    How long can you walk comfortably?  household distances due to LE fatigue/weakness    Diagnostic tests  X-ray    Patient Stated Goals  Walk without AD                      [facility] Adult PT Treatment/Exercise - 05/29/17 1700      Ambulation/Gait   Ambulation/Gait  Yes    Ambulation/Gait Assistance  4: Min assist    Ambulation/Gait Assistance Details  with cane with quad tip with >10 standing rest breaks every 6-8 feet due to fatigue, R genu recurvatum and toe drag causing forward LOB.  Initial 3-4 steps pt demonstrates good use of hip flexion, knee flexion and foot clearance.  Performed again with use of shoe cover to simulate leather toe cap to decrease friction and allow toe to slide through; performed gait velocity assessment with cane and shoe cover: see below.  Had f/u discussion with pt about use of more appropriate AFO - pt states he would be agreeable to using an AFO if it would help.  Trialed use of posterior leaf spring AFO for increased DF assistance and knee control; performed gait  x 115' with RW + 115' with cane and AFO with improved foot clearance, step length and decreased foot drag and anterior LOB    Ambulation Distance (Feet)  230 Feet    Assistive device  Straight cane    Gait Pattern  Step-through pattern;Step-to pattern;Decreased step length - right;Decreased stride length;Decreased hip/knee flexion - right;Decreased dorsiflexion - right;Right genu recurvatum;Poor foot clearance - right    Ambulation Surface  Level;Indoor    Gait velocity  35 seconds with cane or .50f/sec; with blue shoe cover to simulate leather toe cap              PT Education - 05/29/17 1706    Education provided  Yes    Education Details  gait with cane and AFO    Person(s) Educated  Patient;Parent(s)    Methods  Explanation    Comprehension  Need further  instruction       PT Short Term Goals - 05/29/17 1708      PT SHORT TERM GOAL #1   Title  Pt will participate in further assessment for appropriate AFO (no AFO, current off the shelf AFO, or new AFO?) to decrease risk for falls.    Baseline  05/23/17: pt still is not fully commited to an AFO. Have been trialing Bioness in past few sessions. Continued to trial and discuss on 05/29/17    Status  Partially Met      PT SHORT TERM GOAL #2   Title  Pt will ambulate 250' on level surfaces with cane with min A    Baseline  Ambulated 230' + 115' on level surface with cane with tripod tip with min-mod A    Status  Partially Met      PT SHORT TERM GOAL #3   Title  Pt will improve R ankle DF ROM to >12 deg and increase PF >40 deg    Baseline  05/23/17: right ankle AROM -6 DF, 38 PF, PROM 15 DF,  55 PF.  met goals with PROM only    Status  Achieved      PT SHORT TERM GOAL #4   Title  Pt will demonstrate improved LE strength as indicated by decrease in five time sit to stand < or = 12 seconds with one UE support    Baseline  05/23/17: 10.60 sec's with UE support from standard height chair    Status  Achieved      PT SHORT TERM GOAL #5   Title  Pt will improve standing balance as indicated by increase in BERG balance score to > or = 45/56    Baseline  05/23/17: 44/56 scored today, improved from 42/56 just not to goal    Status  Partially Met      PT SHORT TERM GOAL #6   Title  Pt will improve safety with gait as indicated by increase in gait velocity to 1.881fsec with cane    Baseline  05/23/17: 2.36 ft/sec with RW; 0.93 ft/sec with cane    Status  Partially Met        PT Long Term Goals - 05/29/17 1714      PT LONG TERM GOAL #1   Title  Pt will ambulate 250' on indoor surfaces and will negotiate 5 stairs with one rail, with cane/most appropriate AFO with supervision    Baseline  11/29: Straight cane with tripod tip for 230' with Min Assist needed, is walking short distances (~100-200 feet)  with RW with supervision, taking  rest breaks as needed (standing rest breaks)    Status  Revised    Target Date  06/24/17      PT LONG TERM GOAL #3   Title  Pt will improve LE strength as indicated by improvement in five time sit to stand to < or = 10 seconds    Baseline  10.6 seconds at 4 weeks    Status  Achieved      PT LONG TERM GOAL #5   Title  Pt will decrease falls risk as indicated by increase in BERG score to > or = 46/56    Baseline  44/56 at 4 weeks    Status  Revised    Target Date  06/24/17      PT LONG TERM GOAL #6   Title  Pt will demonstrate ability to perform standing balance and strengthening HEP safely MOD I    Baseline  04/25/17: continues to need cues/assist to decrease compensatory movements    Status  Revised    Target Date  06/24/17      PT LONG TERM GOAL #7   Title  Pt will improve gait velocity to > or = 2.6 ft/sec with RW and >1.0 ft/sec with cane and appropriate AFO     Baseline  2.36 with RW at 4 weeks; 0.21f/sec with cane    Status  Revised    Target Date  06/24/17            Plan - 05/29/17 1711    Clinical Impression Statement  Completed assessment of STG with continued gait training with cane while trialing shoe cover to simulate a leather toe cap and trial of posterior leaf spring AFO for DF and increased knee control.  Pt demonstrated safer and more efficient gait mechanics and was able to tolerate greater walking distances today but pt's gait speed with cane indicates pt is still at high falls risk with cane and pt experienced 3-4 anterior LOB while ambulating with cane requiring therapist mod A to prevent fall forwards.  Will continue to assess and practice with AFO and use of BIONESS for strengthening and motor planning.    Rehab Potential  Good    Clinical Impairments Affecting Rehab Potential  MS     PT Frequency  2x / week    PT Duration  8 weeks    PT Treatment/Interventions  ADLs/Self Care Home Management;Cryotherapy;Vasopneumatic  Device;Taping;Gait training;Stair training;Functional mobility training;Therapeutic activities;Therapeutic exercise;Balance training;Neuromuscular re-education;Passive range of motion;Manual techniques;Patient/family education;Electrical Stimulation;DME Instruction;Orthotic Fit/Training;Energy conservation    PT Next Visit Plan  gait with R Posterior leaf spring AFO/shoe cover; Bioness training with RW, cane or in // bars - no AD; TRAINING MODE FOR ANKLE DF AND HAMSTRING STRENGTHENING. Focus on R hip closed chain hip ABD and trunk control training, LE strengthening especially hamstring and hip flexor, single limb stance and proximal hip stability, trunk/postural control, rocker board for ankle ROM and strength- monitor HR and fatigue level    PT Home Exercise Plan  Pt has program at home for strengthening and balance    Consulted and Agree with Plan of Care  Patient    Family Member Consulted  mom       Patient will benefit from skilled therapeutic intervention in order to improve the following deficits and impairments:  Abnormal gait, Pain, Decreased range of motion, Decreased balance, Decreased mobility, Difficulty walking, Decreased activity tolerance, Decreased strength, Decreased endurance, Impaired flexibility  Visit Diagnosis: Muscle weakness (generalized)  Unsteadiness on feet  Difficulty in walking, not elsewhere classified     Problem List Patient Active Problem List   Diagnosis Date Noted  . Vitamin D insufficiency 11/29/2016  . Hypogonadism in male 11/01/2016  . Type 1 diabetes mellitus ([place])   . Hypothyroidism   . Hypertension   . Hyperlipidemia   . Closed fracture of right fibula and tibia 10/29/2016  . Closed tibia fracture 10/29/2016  . Closed fracture of right tibial plafond with fibula involvement 10/29/2016  . Abnormal liver function tests 03/06/2013  . RASH AND OTHER NONSPECIFIC SKIN ERUPTION 10/07/2009  . SHOULDER PAIN, LEFT 12/27/2008  . Type I (juvenile type)  diabetes mellitus without mention of complication, uncontrolled 12/03/2007  . HYPOTHYROIDISM 11/12/2007  . HYPERCHOLESTEROLEMIA 11/12/2007  . MULTIPLE SCLEROSIS 08/29/2007  . PROLIFERATIVE DIABETIC RETINOPATHY 08/29/2007  . HYPERTENSION 12/25/2006    [name], PT, DPT 05/29/17    5:20 PM    [facility] [address] [place], [place], [address] Phone: [phone]   Fax:  [phone]  Name: TREMAR WICKENS MRN: [id number] Date of Birth: 15-May-1971

## 2017-05-30 NOTE — Telephone Encounter (Signed)
I contacted the patient and he did confirm he had been taking 150 mcg of the levothyroxine before his last office visit. Patient advised the rx has been changed to 175 mcg. Patient voiced understanding and had no further questions at this time.

## 2017-05-30 NOTE — Telephone Encounter (Signed)
I contacted the patient and advised to call back and clarify if he was on the 150 mcg prior to his last office visit.

## 2017-05-31 ENCOUNTER — Ambulatory Visit: Payer: Medicare Other | Admitting: Physical Therapy

## 2017-05-31 ENCOUNTER — Encounter: Payer: Self-pay | Admitting: Physical Therapy

## 2017-05-31 DIAGNOSIS — M6281 Muscle weakness (generalized): Secondary | ICD-10-CM | POA: Diagnosis not present

## 2017-05-31 DIAGNOSIS — R2681 Unsteadiness on feet: Secondary | ICD-10-CM | POA: Diagnosis not present

## 2017-05-31 DIAGNOSIS — R262 Difficulty in walking, not elsewhere classified: Secondary | ICD-10-CM

## 2017-05-31 DIAGNOSIS — M25671 Stiffness of right ankle, not elsewhere classified: Secondary | ICD-10-CM | POA: Diagnosis not present

## 2017-05-31 NOTE — Therapy (Signed)
[facility] [address] [place], [place], [address] Phone: [phone]   Fax:  [phone]  Physical Therapy Treatment  Patient Details  Name: Johnathan Ross MRN: [id number] Date of Birth: 05-03-1971 Referring Provider: Altamese [place], MD   Encounter Date: 05/31/2017  PT End of Session - 05/31/17 1110    Visit Number  35    Number of Visits  43    Date for PT Re-Evaluation  06/24/17    Authorization Type  Medicare    PT Start Time  1104    PT Stop Time  1145    PT Time Calculation (min)  41 min    Equipment Utilized During Treatment  Gait belt    Activity Tolerance  Patient tolerated treatment well;Patient limited by fatigue    Behavior During Therapy  [facility] for tasks assessed/performed       Past Medical History:  Diagnosis Date  . Hyperlipidemia   . Hypertension   . Hypogonadism in male 11/01/2016  . Hypothyroidism   . Multiple sclerosis ([place])   . 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 Type 1 diabetes mellitus ([place]) dx'd 1994  . Vitamin D insufficiency 11/29/2016    Past Surgical History:  Procedure Laterality Date  . EYE SURGERY Bilateral    "laser for diabetic retinopathy"  . FRACTURE SURGERY    . OPEN REDUCTION INTERNAL FIXATION (ORIF) TIBIA/FIBULA FRACTURE Right 10/30/2016  . ORIF ANKLE FRACTURE Left 2015  . ORIF TIBIA FRACTURE Right 10/30/2016   Procedure: OPEN REDUCTION INTERNAL FIXATION (ORIF) TIBIA FIBULA FRACTURE;  Surgeon: Altamese [place], MD;  Location: [place];  Service: Orthopedics;  Laterality: Right;  . RETINAL LASER PROCEDURE Bilateral    "for diabetic retinopathy"    There were no vitals filed for this visit.  Subjective Assessment - 05/31/17 1109    Subjective  No new complaints. Tired from walking into clinic 2x today and driving himself both times. (arrived this am at the wrong time and then came back for correct time). No falls.     Pertinent History  MS dx about 6 years ago    How long can you sit comfortably?  unlimited    How long can you stand comfortably?  unlimited    How long can you walk comfortably?  household distances due to LE fatigue/weakness    Diagnostic tests  X-ray    Patient Stated Goals  Walk without AD    Currently in Pain?  No/denies            [facility] Adult PT Treatment/Exercise - 05/31/17 1111      Transfers   Transfers  Sit to Stand;Stand to Sit    Sit to Stand  5: Supervision;With upper extremity assist    Stand to Sit  5: Supervision;With upper extremity assist      Ambulation/Gait   Ambulation/Gait  Yes    Ambulation/Gait Assistance  4: Min guard    Ambulation/Gait Assistance Details  use of posterior [place] brace and simulated toe cap on right LE. 5-6 short standing rest breaks due to fatigue. Noted left knee with genu recurvatum as well which pt reported started a few weeks ago. Right knee controlled with AFO with improved foot clearance with AFO/she cover combined.     Ambulation Distance (Feet)  230 Feet    Assistive device  Rolling walker    Gait Pattern  Step-through pattern;Decreased stride length;Scissoring;Decreased trunk rotation;Narrow base of support;Poor foot clearance - right;Left genu recurvatum  Ambulation Surface  Level;Indoor      Neuro Re-ed    Neuro Re-ed Details   NMR for core/postural/hip strengthening/stability: tall kneeling with red pball- mini squats with emphasis on equal LE weight bearing and return to full, upright posture x 10 reps, alt UE raises x 10 reps with emphasis on maintaining hip extension/tall posture, then upper trunk rotation left<>right with reaching behind him x 10 each side with emphasis on tall posture. min guard assist with cues for abd/glut activation for stability.                                       Knee/Hip Exercises: Supine   Bridges  Strengthening;Both;10 reps    Bridges Limitations  bil feet on mat with 5 sec holds at top, followed by single leg bridge with one leg propped  on stool/other foot on mat x 10 each side.     Single Leg Bridge  Strengthening;Right;Left;1 set;10 reps      Knee/Hip Exercises: Prone   Hamstring Curl  1 set;10 reps;Limitations    Hamstring Curl Limitations  red band resistance with rest breaks needed, mostly with right LE>Left LE           PT Short Term Goals - 05/29/17 1708      PT SHORT TERM GOAL #1   Title  Pt will participate in further assessment for appropriate AFO (no AFO, current off the shelf AFO, or new AFO?) to decrease risk for falls.    Baseline  05/23/17: pt still is not fully commited to an AFO. Have been trialing Bioness in past few sessions. Continued to trial and discuss on 05/29/17    Status  Partially Met      PT SHORT TERM GOAL #2   Title  Pt will ambulate 250' on level surfaces with cane with min A    Baseline  Ambulated 230' + 115' on level surface with cane with tripod tip with min-mod A    Status  Partially Met      PT SHORT TERM GOAL #3   Title  Pt will improve R ankle DF ROM to >12 deg and increase PF >40 deg    Baseline  05/23/17: right ankle AROM -6 DF, 38 PF, PROM 15 DF,  55 PF.  met goals with PROM only    Status  Achieved      PT SHORT TERM GOAL #4   Title  Pt will demonstrate improved LE strength as indicated by decrease in five time sit to stand < or = 12 seconds with one UE support    Baseline  05/23/17: 10.60 sec's with UE support from standard height chair    Status  Achieved      PT SHORT TERM GOAL #5   Title  Pt will improve standing balance as indicated by increase in BERG balance score to > or = 45/56    Baseline  05/23/17: 44/56 scored today, improved from 42/56 just not to goal    Status  Partially Met      PT SHORT TERM GOAL #6   Title  Pt will improve safety with gait as indicated by increase in gait velocity to 1.6f/sec with cane    Baseline  05/23/17: 2.36 ft/sec with RW; 0.93 ft/sec with cane    Status  Partially Met        PT Long Term Goals - 05/29/17  [place] #1   Title  Pt will ambulate 250' on indoor surfaces and will negotiate 5 stairs with one rail, with cane/most appropriate AFO with supervision    Baseline  11/29: Straight cane with tripod tip for 230' with Min Assist needed, is walking short distances (~100-200 feet) with RW with supervision, taking rest breaks as needed (standing rest breaks)    Status  Revised    Target Date  06/24/17      PT LONG TERM GOAL #3   Title  Pt will improve LE strength as indicated by improvement in five time sit to stand to < or = 10 seconds    Baseline  10.6 seconds at 4 weeks    Status  Achieved      PT LONG TERM GOAL #5   Title  Pt will decrease falls risk as indicated by increase in BERG score to > or = 46/56    Baseline  44/56 at 4 weeks    Status  Revised    Target Date  06/24/17      PT LONG TERM GOAL #6   Title  Pt will demonstrate ability to perform standing balance and strengthening HEP safely MOD I    Baseline  04/25/17: continues to need cues/assist to decrease compensatory movements    Status  Revised    Target Date  06/24/17      PT LONG TERM GOAL #7   Title  Pt will improve gait velocity to > or = 2.6 ft/sec with RW and >1.0 ft/sec with cane and appropriate AFO     Baseline  2.36 with RW at 4 weeks; 0.50f/sec with cane    Status  Revised    Target Date  06/24/17            Plan - 05/31/17 1110    Clinical Impression Statement  Today's skilled session continued to address gait with RW/posterior AFO/simulated toe cap with improved LE advancement noted. Also noted that the left knee demo's genu recurvatum in stance now as well. ? use of brace on left side as well.                                          Rehab Potential  Good    Clinical Impairments Affecting Rehab Potential  MS     PT Frequency  2x / week    PT Duration  8 weeks    PT Treatment/Interventions  ADLs/Self Care Home Management;Cryotherapy;Vasopneumatic Device;Taping;Gait training;Stair  training;Functional mobility training;Therapeutic activities;Therapeutic exercise;Balance training;Neuromuscular re-education;Passive range of motion;Manual techniques;Patient/family education;Electrical Stimulation;DME Instruction;Orthotic Fit/Training;Energy conservation    PT Next Visit Plan  gait with R Posterior leaf spring AFO/shoe cover; Bioness training with RW, cane or in // bars - no AD; TRAINING MODE FOR ANKLE DF AND HAMSTRING STRENGTHENING. Focus on R hip closed chain hip ABD and trunk control training, LE strengthening especially hamstring and hip flexor, single limb stance and proximal hip stability, trunk/postural control, rocker board for ankle ROM and strength- monitor HR and fatigue level    PT Home Exercise Plan  Pt has program at home for strengthening and balance    Consulted and Agree with Plan of Care  Patient    Family Member Consulted  mom       Patient will benefit from skilled therapeutic intervention in order to  improve the following deficits and impairments:  Abnormal gait, Pain, Decreased range of motion, Decreased balance, Decreased mobility, Difficulty walking, Decreased activity tolerance, Decreased strength, Decreased endurance, Impaired flexibility  Visit Diagnosis: Muscle weakness (generalized)  Unsteadiness on feet  Difficulty in walking, not elsewhere classified     Problem List Patient Active Problem List   Diagnosis Date Noted  . Vitamin D insufficiency 11/29/2016  . Hypogonadism in male 11/01/2016  . Type 1 diabetes mellitus ([place])   . Hypothyroidism   . Hypertension   . Hyperlipidemia   . Closed fracture of right fibula and tibia 10/29/2016  . Closed tibia fracture 10/29/2016  . Closed fracture of right tibial plafond with fibula involvement 10/29/2016  . Abnormal liver function tests 03/06/2013  . RASH AND OTHER NONSPECIFIC SKIN ERUPTION 10/07/2009  . SHOULDER PAIN, LEFT 12/27/2008  . Type I (juvenile type) diabetes mellitus without mention  of complication, uncontrolled 12/03/2007  . HYPOTHYROIDISM 11/12/2007  . HYPERCHOLESTEROLEMIA 11/12/2007  . MULTIPLE SCLEROSIS 08/29/2007  . PROLIFERATIVE DIABETIC RETINOPATHY 08/29/2007  . HYPERTENSION 12/25/2006    Willow Ora, PTA, [facility] [address] [place], [place] [address] [phone] 05/31/17, 4:08 PM   Name: REVIS WHALIN MRN: [id number] Date of Birth: 1971/02/04

## 2017-06-03 ENCOUNTER — Ambulatory Visit: Payer: Medicare Other | Admitting: Physical Therapy

## 2017-06-03 ENCOUNTER — Encounter: Payer: Self-pay | Admitting: Physical Therapy

## 2017-06-03 DIAGNOSIS — R2681 Unsteadiness on feet: Secondary | ICD-10-CM | POA: Diagnosis not present

## 2017-06-03 DIAGNOSIS — M6281 Muscle weakness (generalized): Secondary | ICD-10-CM

## 2017-06-03 DIAGNOSIS — M25671 Stiffness of right ankle, not elsewhere classified: Secondary | ICD-10-CM | POA: Diagnosis not present

## 2017-06-03 DIAGNOSIS — R262 Difficulty in walking, not elsewhere classified: Secondary | ICD-10-CM

## 2017-06-03 NOTE — Therapy (Signed)
[facility] [address] [place], [place], [address] Phone: [phone]   Fax:  [phone]  Physical Therapy Treatment  Patient Details  Name: Johnathan Ross MRN: [id number] Date of Birth: [birth year] Referring Provider: Altamese [place], MD   Encounter Date: 06/03/2017  PT End of Session - 06/03/17 0851    Visit Number  36    Number of Visits  43    Date for PT Re-Evaluation  06/24/17    Authorization Type  Medicare    PT Start Time  0849    PT Stop Time  0928    PT Time Calculation (min)  39 min    Equipment Utilized During Treatment  Gait belt    Activity Tolerance  Patient tolerated treatment well;Patient limited by fatigue    Behavior During Therapy  [facility] for tasks assessed/performed       Past Medical History:  Diagnosis Date  . Hyperlipidemia   . Hypertension   . Hypogonadism in male 11/01/2016  . Hypothyroidism   . Multiple sclerosis ([place])   . 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 Type 1 diabetes mellitus ([place]) dx'd 1994  . Vitamin D insufficiency 11/29/2016    Past Surgical History:  Procedure Laterality Date  . EYE SURGERY Bilateral    "laser for diabetic retinopathy"  . FRACTURE SURGERY    . OPEN REDUCTION INTERNAL FIXATION (ORIF) TIBIA/FIBULA FRACTURE Right 10/30/2016  . ORIF ANKLE FRACTURE Left 2015  . ORIF TIBIA FRACTURE Right 10/30/2016   Procedure: OPEN REDUCTION INTERNAL FIXATION (ORIF) TIBIA FIBULA FRACTURE;  Surgeon: Altamese [place], MD;  Location: [facility];  Service: Orthopedics;  Laterality: Right;  . RETINAL LASER PROCEDURE Bilateral    "for diabetic retinopathy"    There were no vitals filed for this visit.  Subjective Assessment - 06/03/17 0851    Subjective  No new complaints. No falls or pain to report.    Patient is accompained by:  Family member    Pertinent History  MS dx about 6 years ago    How long can you sit comfortably?  unlimited    How long can you stand  comfortably?  unlimited    How long can you walk comfortably?  household distances due to LE fatigue/weakness    Diagnostic tests  X-ray    Patient Stated Goals  Walk without AD    Currently in Pain?  No/denies           [facility] Adult PT Treatment/Exercise - 06/03/17 6962      Ambulation/Gait   Ambulation/Gait  Yes    Ambulation/Gait Assistance  4: Min guard    Ambulation/Gait Assistance Details  use of Bioness with gait for improved right DF and decreased right genu recurvatum. improvement in both until pt becomes fatigued. 2 episodes of foot catching with gait and genu recurvatum notable for last 10-20 feet of both gait trials.     Ambulation Distance (Feet)  230 Feet x1, 115 x1, plus around gym    Assistive device  Rolling walker    Gait Pattern  Step-through pattern;Decreased stride length;Scissoring;Decreased trunk rotation;Narrow base of support;Poor foot clearance - right;Left genu recurvatum    Ambulation Surface  Level;Indoor      Neuro Re-ed    Neuro Re-ed Details   use of Bioness for right DF/hamstring neuro re-ed concurrent with the following: with right foot on bottom step, lifting left foot up into air for sustained holds during on time of 8 sec's, resting for off  time of 6 sec's for 15 reps. manual assist to ensure knee stability during on times. at wall: wall slide down during on times for 5 sec holds, up and resting for off time of 10 sec's. min assist at times to return to standing with box underneath pt for safety (in case he needed to sit and unable to slide back up). 10 reps with assistance for knee stability on both sides.         Acupuncturist Location  functional estim to R anterior tibialis and R hamstring mm groups    Electrical Stimulation Action  for ant tibialis neuro re-ed/strengthening/DF assist with gait, to hamstrings for neuro re-ed/strengthening/improved knee control in stance    Electrical Stimulation Parameters  steering  electrodes for lower cuff, thigh cuff. see Bioness tablet 1 for saved adjusted parameters.    Electrical Stimulation Goals  Strength;Neuromuscular facilitation          PT Short Term Goals - 05/29/17 1708      PT SHORT TERM GOAL #1   Title  Pt will participate in further assessment for appropriate AFO (no AFO, current off the shelf AFO, or new AFO?) to decrease risk for falls.    Baseline  05/23/17: pt still is not fully commited to an AFO. Have been trialing Bioness in past few sessions. Continued to trial and discuss on 05/29/17    Status  Partially Met      PT SHORT TERM GOAL #2   Title  Pt will ambulate 250' on level surfaces with cane with min A    Baseline  Ambulated 230' + 115' on level surface with cane with tripod tip with min-mod A    Status  Partially Met      PT SHORT TERM GOAL #3   Title  Pt will improve R ankle DF ROM to >12 deg and increase PF >40 deg    Baseline  05/23/17: right ankle AROM -6 DF, 38 PF, PROM 15 DF,  55 PF.  met goals with PROM only    Status  Achieved      PT SHORT TERM GOAL #4   Title  Pt will demonstrate improved LE strength as indicated by decrease in five time sit to stand < or = 12 seconds with one UE support    Baseline  05/23/17: 10.60 sec's with UE support from standard height chair    Status  Achieved      PT SHORT TERM GOAL #5   Title  Pt will improve standing balance as indicated by increase in BERG balance score to > or = 45/56    Baseline  05/23/17: 44/56 scored today, improved from 42/56 just not to goal    Status  Partially Met      PT SHORT TERM GOAL #6   Title  Pt will improve safety with gait as indicated by increase in gait velocity to 1.7f/sec with cane    Baseline  05/23/17: 2.36 ft/sec with RW; 0.93 ft/sec with cane    Status  Partially Met        PT Long Term Goals - 05/29/17 1714      PT LONG TERM GOAL #1   Title  Pt will ambulate 250' on indoor surfaces and will negotiate 5 stairs with one rail, with cane/most  appropriate AFO with supervision    Baseline  11/29: Straight cane with tripod tip for 230' with Min Assist needed, is walking short distances (~100-200 feet)  with RW with supervision, taking rest breaks as needed (standing rest breaks)    Status  Revised    Target Date  06/24/17      PT LONG TERM GOAL #3   Title  Pt will improve LE strength as indicated by improvement in five time sit to stand to < or = 10 seconds    Baseline  10.6 seconds at 4 weeks    Status  Achieved      PT LONG TERM GOAL #5   Title  Pt will decrease falls risk as indicated by increase in BERG score to > or = 46/56    Baseline  44/56 at 4 weeks    Status  Revised    Target Date  06/24/17      PT LONG TERM GOAL #6   Title  Pt will demonstrate ability to perform standing balance and strengthening HEP safely MOD I    Baseline  04/25/17: continues to need cues/assist to decrease compensatory movements    Status  Revised    Target Date  06/24/17      PT LONG TERM GOAL #7   Title  Pt will improve gait velocity to > or = 2.6 ft/sec with RW and >1.0 ft/sec with cane and appropriate AFO     Baseline  2.36 with RW at 4 weeks; 0.79f/sec with cane    Status  Revised    Target Date  06/24/17            Plan - 06/03/17 0852    Clinical Impression Statement  Today's skilled session continued the use of Bioness for correction of gait deviations, strengthening and neuro re-ed with no issues reported. Pt should benefit from continued PT to progress toward unmet goals.     Rehab Potential  Good    Clinical Impairments Affecting Rehab Potential  MS     PT Frequency  2x / week    PT Duration  8 weeks    PT Treatment/Interventions  ADLs/Self Care Home Management;Cryotherapy;Vasopneumatic Device;Taping;Gait training;Stair training;Functional mobility training;Therapeutic activities;Therapeutic exercise;Balance training;Neuromuscular re-education;Passive range of motion;Manual techniques;Patient/family education;Electrical  Stimulation;DME Instruction;Orthotic Fit/Training;Energy conservation    PT Next Visit Plan  gait with R Posterior leaf spring AFO/shoe cover; Bioness training with RW, cane or in // bars - no AD; TRAINING MODE FOR ANKLE DF AND HAMSTRING STRENGTHENING. Focus on R hip closed chain hip ABD and trunk control training, LE strengthening especially hamstring and hip flexor, single limb stance and proximal hip stability, trunk/postural control, rocker board for ankle ROM and strength- monitor HR and fatigue level    PT Home Exercise Plan  Pt has program at home for strengthening and balance    Consulted and Agree with Plan of Care  Patient    Family Member Consulted  mom       Patient will benefit from skilled therapeutic intervention in order to improve the following deficits and impairments:  Abnormal gait, Pain, Decreased range of motion, Decreased balance, Decreased mobility, Difficulty walking, Decreased activity tolerance, Decreased strength, Decreased endurance, Impaired flexibility  Visit Diagnosis: Muscle weakness (generalized)  Difficulty in walking, not elsewhere classified     Problem List Patient Active Problem List   Diagnosis Date Noted  . Vitamin D insufficiency 11/29/2016  . Hypogonadism in male 11/01/2016  . Type 1 diabetes mellitus ([place]   . Hypothyroidism   . Hypertension   . Hyperlipidemia   . Closed fracture of right fibula and tibia 10/29/2016  . Closed tibia fracture 10/29/2016  .  Closed fracture of right tibial plafond with fibula involvement 10/29/2016  . Abnormal liver function tests 03/06/2013  . RASH AND OTHER NONSPECIFIC SKIN ERUPTION 10/07/2009  . SHOULDER PAIN, LEFT 12/27/2008  . Type I (juvenile type) diabetes mellitus without mention of complication, uncontrolled 12/03/2007  . HYPOTHYROIDISM 11/12/2007  . HYPERCHOLESTEROLEMIA 11/12/2007  . MULTIPLE SCLEROSIS 08/29/2007  . PROLIFERATIVE DIABETIC RETINOPATHY 08/29/2007  . HYPERTENSION 12/25/2006     Willow Ora, PTA, [facility] [address] [place], [place] [address] [phone] 06/03/17, 10:43 AM   Name: Johnathan Ross MRN: [id number] Date of Birth: [birth year]

## 2017-06-06 ENCOUNTER — Encounter: Payer: Self-pay | Admitting: Physical Therapy

## 2017-06-06 ENCOUNTER — Ambulatory Visit: Payer: Medicare Other | Admitting: Physical Therapy

## 2017-06-06 DIAGNOSIS — M6281 Muscle weakness (generalized): Secondary | ICD-10-CM | POA: Diagnosis not present

## 2017-06-06 DIAGNOSIS — R262 Difficulty in walking, not elsewhere classified: Secondary | ICD-10-CM | POA: Diagnosis not present

## 2017-06-06 DIAGNOSIS — R2681 Unsteadiness on feet: Secondary | ICD-10-CM

## 2017-06-06 DIAGNOSIS — M25671 Stiffness of right ankle, not elsewhere classified: Secondary | ICD-10-CM | POA: Diagnosis not present

## 2017-06-06 NOTE — Therapy (Signed)
[facility] [address] [place], [place], [address] Phone: [phone]   Fax:  [phone]  Physical Therapy Treatment  Patient Details  Name: Johnathan Ross MRN: [id number] Date of Birth: [birth year] Referring Provider: Altamese [place], MD   Encounter Date: 06/06/2017  PT End of Session - 06/06/17 1307    Visit Number  37    Number of Visits  43    Date for PT Re-Evaluation  06/24/17    Authorization Type  Medicare    PT Start Time  0848    PT Stop Time  0935    PT Time Calculation (min)  47 min    Activity Tolerance  Patient tolerated treatment well    Behavior During Therapy  [facility] for tasks assessed/performed       Past Medical History:  Diagnosis Date  . Hyperlipidemia   . Hypertension   . Hypogonadism in male 11/01/2016  . Hypothyroidism   . Multiple sclerosis ([place])   . 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 Type 1 diabetes mellitus ([place]) dx'd 1994  . Vitamin D insufficiency 11/29/2016    Past Surgical History:  Procedure Laterality Date  . EYE SURGERY Bilateral    "laser for diabetic retinopathy"  . FRACTURE SURGERY    . OPEN REDUCTION INTERNAL FIXATION (ORIF) TIBIA/FIBULA FRACTURE Right 10/30/2016  . ORIF ANKLE FRACTURE Left 2015  . ORIF TIBIA FRACTURE Right 10/30/2016   Procedure: OPEN REDUCTION INTERNAL FIXATION (ORIF) TIBIA FIBULA FRACTURE;  Surgeon: Altamese [place], MD;  Location: [place] [facility] [place];  Service: Orthopedics;  Laterality: Right;  . RETINAL LASER PROCEDURE Bilateral    "for diabetic retinopathy"    There were no vitals filed for this visit.  Subjective Assessment - 06/06/17 1229    Subjective  No issues to report; no falls.  Surprised how fatigued he becomes even when ambulating slowly.    Patient is accompained by:  Family member    Pertinent History  MS dx about 6 years ago    How long can you sit comfortably?  unlimited    How long can you stand comfortably?  unlimited    How long  can you walk comfortably?  household distances due to LE fatigue/weakness    Diagnostic tests  X-ray    Patient Stated Goals  Walk without AD    Currently in Pain?  No/denies                      [facility] Adult PT Treatment/Exercise - 06/06/17 1230      Ambulation/Gait   Ambulation/Gait  Yes    Ambulation/Gait Assistance  4: Min guard;5: Supervision    Ambulation/Gait Assistance Details  with use of Bioness on RLE upper and lower cuffs and use of trial posterior leaf spring - ankle DF contraction turned down in gait mode - for increased DF assist and foot clearance for more efficient gait; pt cued to decrease RLE step length to improve heel strike, knee control and closed chain DF to advance COG forwards; manual facilitation for anterior rotation of pelvis at terminal stance. Decreased standing rest breaks noted, only one episode of foot drag, decreased genu recurvatum with shorter step length    Ambulation Distance (Feet)  345 Feet    Assistive device  Rolling walker    Ambulation Surface  Level;Indoor    Gait Comments  Performed retro gait x 25' x 2 with RW and use of BIONESS with pt demonstrating improved knee flexion, hip  extension and foot clearance during retro gait      Neuro Re-ed    Neuro Re-ed Details   Bioness functional e-stim in training mode for R ankle DF and R hamstring mm activation during tall kneeling and alternating LE advancement in half kneeling and maintaining balance without UE support to facilitate increased postural control and proximal stability with decreased UE support with min A for balance.  Pt experiences lateral and posterior LOB and increased trunk instability without UE support.      Modalities   Modalities  Teacher, English as a foreign language Location  functional estim to R anterior tibialis and R hamstring mm groups    Electrical Stimulation Action  R ankle DF, R knee flexion and hip extension     Electrical Stimulation Parameters  See saved parameters in Tablet one; adjusted settings for steering electrodes to decrease lateral activation/eversion    Electrical Stimulation Goals  Strength;Neuromuscular facilitation             PT Education - 06/06/17 1306    Education provided  Yes    Education Details  continued training of gait with AFO + Bioness    Person(s) Educated  Patient    Methods  Explanation    Comprehension  Verbalized understanding       PT Short Term Goals - 05/29/17 1708      PT SHORT TERM GOAL #1   Title  Pt will participate in further assessment for appropriate AFO (no AFO, current off the shelf AFO, or new AFO?) to decrease risk for falls.    Baseline  05/23/17: pt still is not fully commited to an AFO. Have been trialing Bioness in past few sessions. Continued to trial and discuss on 05/29/17    Status  Partially Met      PT SHORT TERM GOAL #2   Title  Pt will ambulate 250' on level surfaces with cane with min A    Baseline  Ambulated 230' + 115' on level surface with cane with tripod tip with min-mod A    Status  Partially Met      PT SHORT TERM GOAL #3   Title  Pt will improve R ankle DF ROM to >12 deg and increase PF >40 deg    Baseline  05/23/17: right ankle AROM -6 DF, 38 PF, PROM 15 DF,  55 PF.  met goals with PROM only    Status  Achieved      PT SHORT TERM GOAL #4   Title  Pt will demonstrate improved LE strength as indicated by decrease in five time sit to stand < or = 12 seconds with one UE support    Baseline  05/23/17: 10.60 sec's with UE support from standard height chair    Status  Achieved      PT SHORT TERM GOAL #5   Title  Pt will improve standing balance as indicated by increase in BERG balance score to > or = 45/56    Baseline  05/23/17: 44/56 scored today, improved from 42/56 just not to goal    Status  Partially Met      PT SHORT TERM GOAL #6   Title  Pt will improve safety with gait as indicated by increase in gait velocity  to 1.31f/sec with cane    Baseline  05/23/17: 2.36 ft/sec with RW; 0.93 ft/sec with cane    Status  Partially Met  PT Long Term Goals - 05/29/17 1714      PT LONG TERM GOAL #1   Title  Pt will ambulate 250' on indoor surfaces and will negotiate 5 stairs with one rail, with cane/most appropriate AFO with supervision    Baseline  11/29: Straight cane with tripod tip for 230' with Min Assist needed, is walking short distances (~100-200 feet) with RW with supervision, taking rest breaks as needed (standing rest breaks)    Status  Revised    Target Date  06/24/17      PT LONG TERM GOAL #3   Title  Pt will improve LE strength as indicated by improvement in five time sit to stand to < or = 10 seconds    Baseline  10.6 seconds at 4 weeks    Status  Achieved      PT LONG TERM GOAL #5   Title  Pt will decrease falls risk as indicated by increase in BERG score to > or = 46/56    Baseline  44/56 at 4 weeks    Status  Revised    Target Date  06/24/17      PT LONG TERM GOAL #6   Title  Pt will demonstrate ability to perform standing balance and strengthening HEP safely MOD I    Baseline  04/25/17: continues to need cues/assist to decrease compensatory movements    Status  Revised    Target Date  06/24/17      PT LONG TERM GOAL #7   Title  Pt will improve gait velocity to > or = 2.6 ft/sec with RW and >1.0 ft/sec with cane and appropriate AFO     Baseline  2.36 with RW at 4 weeks; 0.93f/sec with cane    Status  Revised    Target Date  06/24/17            Plan - 06/06/17 1700    Clinical Impression Statement  Assessed effectiveness of using functional e-stim/Bioness combined with trial posterior leaf spring AFO for increased ankle stability, DF assist and foot clearance as pt fatigues.  Pt demonstrated significantly improved gait efficiency and endurance with RW with decreased standing rest breaks, decreased episodes of R foot drag, and decreased R genu recurvatum.  Continued  postural control and proximal hip stability training in tall and half kneeling; pt continues to demonstrate poor trunk control and balance when UE support removed.  Will continue to address and progress towards goals.      Rehab Potential  Good    Clinical Impairments Affecting Rehab Potential  MS     PT Frequency  2x / week    PT Duration  8 weeks    PT Treatment/Interventions  ADLs/Self Care Home Management;Cryotherapy;Vasopneumatic Device;Taping;Gait training;Stair training;Functional mobility training;Therapeutic activities;Therapeutic exercise;Balance training;Neuromuscular re-education;Passive range of motion;Manual techniques;Patient/family education;Electrical Stimulation;DME Instruction;Orthotic Fit/Training;Energy conservation    PT Next Visit Plan  gait with R Posterior leaf spring AFO AND bioness training with RW, cane or in // bars; tall and half kneeling with BIONESS decreasing UE support for increased postural control; TRAINING MODE FOR ANKLE DF AND HAMSTRING STRENGTHENING. Focus on R hip closed chain hip ABD and trunk control training, LE strengthening especially hamstring and hip flexor, single limb stance and proximal hip stability, trunk/postural control, rocker board for ankle ROM and strength- monitor HR and fatigue level    PT Home Exercise Plan  Pt has program at home for strengthening and balance    Consulted and Agree with Plan of  Care  Patient    Family Member Consulted  mom       Patient will benefit from skilled therapeutic intervention in order to improve the following deficits and impairments:  Abnormal gait, Pain, Decreased range of motion, Decreased balance, Decreased mobility, Difficulty walking, Decreased activity tolerance, Decreased strength, Decreased endurance, Impaired flexibility  Visit Diagnosis: Muscle weakness (generalized)  Difficulty in walking, not elsewhere classified  Unsteadiness on feet     Problem List Patient Active Problem List    Diagnosis Date Noted  . Vitamin D insufficiency 11/29/2016  . Hypogonadism in male 11/01/2016  . Type 1 diabetes mellitus ([place])   . Hypothyroidism   . Hypertension   . Hyperlipidemia   . Closed fracture of right fibula and tibia 10/29/2016  . Closed tibia fracture 10/29/2016  . Closed fracture of right tibial plafond with fibula involvement 10/29/2016  . Abnormal liver function tests 03/06/2013  . RASH AND OTHER NONSPECIFIC SKIN ERUPTION 10/07/2009  . SHOULDER PAIN, LEFT 12/27/2008  . Type I (juvenile type) diabetes mellitus without mention of complication, uncontrolled 12/03/2007  . HYPOTHYROIDISM 11/12/2007  . HYPERCHOLESTEROLEMIA 11/12/2007  . MULTIPLE SCLEROSIS 08/29/2007  . PROLIFERATIVE DIABETIC RETINOPATHY 08/29/2007  . HYPERTENSION 12/25/2006    [name], PT, DPT 06/06/17    5:07 PM    [facility] [address] [place], [place], [address] Phone: [phone]   Fax:  [phone]  Name: Johnathan Ross MRN: [id number] Date of Birth: [birth year]

## 2017-06-10 ENCOUNTER — Encounter: Payer: Self-pay | Admitting: Physical Therapy

## 2017-06-10 ENCOUNTER — Ambulatory Visit: Payer: Medicare Other | Admitting: Physical Therapy

## 2017-06-10 DIAGNOSIS — M6281 Muscle weakness (generalized): Secondary | ICD-10-CM | POA: Diagnosis not present

## 2017-06-10 DIAGNOSIS — M25671 Stiffness of right ankle, not elsewhere classified: Secondary | ICD-10-CM | POA: Diagnosis not present

## 2017-06-10 DIAGNOSIS — R262 Difficulty in walking, not elsewhere classified: Secondary | ICD-10-CM | POA: Diagnosis not present

## 2017-06-10 DIAGNOSIS — R2681 Unsteadiness on feet: Secondary | ICD-10-CM | POA: Diagnosis not present

## 2017-06-10 NOTE — Therapy (Signed)
[facility] [address] [place], [place], [address] Phone: [phone]   Fax:  [phone]  Physical Therapy Treatment  Patient Details  Name: Johnathan Ross MRN: [id number] Date of Birth: 09/06/1970 Referring Provider: Altamese Bayard, MD   Encounter Date: 06/10/2017  PT End of Session - 06/10/17 0851    Visit Number  38    Number of Visits  43    Date for PT Re-Evaluation  06/24/17    Authorization Type  Medicare    PT Start Time  0850    PT Stop Time  0930    PT Time Calculation (min)  40 min    Equipment Utilized During Treatment  Gait belt    Activity Tolerance  Patient tolerated treatment well    Behavior During Therapy  [facility] for tasks assessed/performed       Past Medical History:  Diagnosis Date  . Hyperlipidemia   . Hypertension   . Hypogonadism in male 11/01/2016  . Hypothyroidism   . Multiple sclerosis ([place])   . 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 Type 1 diabetes mellitus ([place]) dx'd 1994  . Vitamin D insufficiency 11/29/2016    Past Surgical History:  Procedure Laterality Date  . EYE SURGERY Bilateral    "laser for diabetic retinopathy"  . FRACTURE SURGERY    . OPEN REDUCTION INTERNAL FIXATION (ORIF) TIBIA/FIBULA FRACTURE Right 10/30/2016  . ORIF ANKLE FRACTURE Left 2015  . ORIF TIBIA FRACTURE Right 10/30/2016   Procedure: OPEN REDUCTION INTERNAL FIXATION (ORIF) TIBIA FIBULA FRACTURE;  Surgeon: Altamese Le Flore, MD;  Location: [facility];  Service: Orthopedics;  Laterality: Right;  . RETINAL LASER PROCEDURE Bilateral    "for diabetic retinopathy"    There were no vitals filed for this visit.  Subjective Assessment - 06/10/17 0850    Subjective  No new complaints. No pain or falls to report.    Patient is accompained by:  Family member    Pertinent History  MS dx about 6 years ago    How long can you sit comfortably?  unlimited    How long can you stand comfortably?  unlimited    How long  can you walk comfortably?  household distances due to LE fatigue/weakness    Diagnostic tests  X-ray    Patient Stated Goals  Walk without AD    Currently in Pain?  No/denies          [facility] Adult PT Treatment/Exercise - 06/10/17 0852      Transfers   Transfers  Sit to Stand;Stand to Sit    Sit to Stand  5: Supervision;With upper extremity assist    Stand to Sit  5: Supervision;With upper extremity assist      Ambulation/Gait   Ambulation/Gait  Yes    Ambulation/Gait Assistance  4: Min guard;5: Supervision    Ambulation/Gait Assistance Details  use of Bioness concurrent with right posterior leaf AFO for improved foot clearance and knee flexion with swing phase/stance control. occasional toe scuffing noted with gait, especially toward end of each gait trial with fatigue setting in. Pt needed one standing rest break with each gait trial as well.                Ambulation Distance (Feet)  230 Feet    Assistive device  Rolling walker    Gait Pattern  Step-through pattern;Decreased stride length;Scissoring;Decreased trunk rotation;Narrow base of support;Poor foot clearance - right;Left genu recurvatum    Ambulation Surface  Level;Indoor  Neuro Re-ed    Neuro Re-ed Details   Bioness used concurrently with the following activities for strengthening/neuro re-ed: tall kneeling with mini squats with on times, rest in tall kneeling on off times x 15 reps with UE support on red ball. then tall kneeling to half kneel duirng on times, rest with off times in tall kneel/tall kneel with sitting on feet. 5 res each side      Electrical Stimulation   Electrical Stimulation Location  functional estim to R anterior tibialis and R hamstring mm groups    Electrical Stimulation Action  for improved right ankle DF, improved right knee flexion/hip extension and improved knee control with stance on right    Electrical Stimulation Parameters  refer to tablet 1 for saved/adjusted parameters; steering electrodes     Electrical Stimulation Goals  Strength;Neuromuscular facilitation           PT Short Term Goals - 05/29/17 1708      PT SHORT TERM GOAL #1   Title  Pt will participate in further assessment for appropriate AFO (no AFO, current off the shelf AFO, or new AFO?) to decrease risk for falls.    Baseline  05/23/17: pt still is not fully commited to an AFO. Have been trialing Bioness in past few sessions. Continued to trial and discuss on 05/29/17    Status  Partially Met      PT SHORT TERM GOAL #2   Title  Pt will ambulate 250' on level surfaces with cane with min A    Baseline  Ambulated 230' + 115' on level surface with cane with tripod tip with min-mod A    Status  Partially Met      PT SHORT TERM GOAL #3   Title  Pt will improve R ankle DF ROM to >12 deg and increase PF >40 deg    Baseline  05/23/17: right ankle AROM -6 DF, 38 PF, PROM 15 DF,  55 PF.  met goals with PROM only    Status  Achieved      PT SHORT TERM GOAL #4   Title  Pt will demonstrate improved LE strength as indicated by decrease in five time sit to stand < or = 12 seconds with one UE support    Baseline  05/23/17: 10.60 sec's with UE support from standard height chair    Status  Achieved      PT SHORT TERM GOAL #5   Title  Pt will improve standing balance as indicated by increase in BERG balance score to > or = 45/56    Baseline  05/23/17: 44/56 scored today, improved from 42/56 just not to goal    Status  Partially Met      PT SHORT TERM GOAL #6   Title  Pt will improve safety with gait as indicated by increase in gait velocity to 1.76f/sec with cane    Baseline  05/23/17: 2.36 ft/sec with RW; 0.93 ft/sec with cane    Status  Partially Met        PT Long Term Goals - 05/29/17 1714      PT LONG TERM GOAL #1   Title  Pt will ambulate 250' on indoor surfaces and will negotiate 5 stairs with one rail, with cane/most appropriate AFO with supervision    Baseline  11/29: Straight cane with tripod tip for 230'  with Min Assist needed, is walking short distances (~100-200 feet) with RW with supervision, taking rest breaks as needed (standing rest  breaks)    Status  Revised    Target Date  06/24/17      PT LONG TERM GOAL #3   Title  Pt will improve LE strength as indicated by improvement in five time sit to stand to < or = 10 seconds    Baseline  10.6 seconds at 4 weeks    Status  Achieved      PT LONG TERM GOAL #5   Title  Pt will decrease falls risk as indicated by increase in BERG score to > or = 46/56    Baseline  44/56 at 4 weeks    Status  Revised    Target Date  06/24/17      PT LONG TERM GOAL #6   Title  Pt will demonstrate ability to perform standing balance and strengthening HEP safely MOD I    Baseline  04/25/17: continues to need cues/assist to decrease compensatory movements    Status  Revised    Target Date  06/24/17      PT LONG TERM GOAL #7   Title  Pt will improve gait velocity to > or = 2.6 ft/sec with RW and >1.0 ft/sec with cane and appropriate AFO     Baseline  2.36 with RW at 4 weeks; 0.51f/sec with cane    Status  Revised    Target Date  06/24/17           Plan - 06/10/17 0851    Clinical Impression Statement  Today's skilled session continued use of Bioness for muscle re-ed/strengthening with gait and other activities. Pt is making slow, steady progress toward goals. Pt should benefit from continued PT to progress toward unmet goals.     Rehab Potential  Good    Clinical Impairments Affecting Rehab Potential  MS     PT Frequency  2x / week    PT Duration  8 weeks    PT Treatment/Interventions  ADLs/Self Care Home Management;Cryotherapy;Vasopneumatic Device;Taping;Gait training;Stair training;Functional mobility training;Therapeutic activities;Therapeutic exercise;Balance training;Neuromuscular re-education;Passive range of motion;Manual techniques;Patient/family education;Electrical Stimulation;DME Instruction;Orthotic Fit/Training;Energy conservation    PT  Next Visit Plan  gait with R Posterior leaf spring AFO AND bioness training with RW, cane or in // bars; tall and half kneeling with BIONESS decreasing UE support for increased postural control; TRAINING MODE FOR ANKLE DF AND HAMSTRING STRENGTHENING. Focus on R hip closed chain hip ABD and trunk control training, LE strengthening especially hamstring and hip flexor, single limb stance and proximal hip stability, trunk/postural control, rocker board for ankle ROM and strength- monitor HR and fatigue level    PT Home Exercise Plan  Pt has program at home for strengthening and balance    Consulted and Agree with Plan of Care  Patient    Family Member Consulted  mom       Patient will benefit from skilled therapeutic intervention in order to improve the following deficits and impairments:  Abnormal gait, Pain, Decreased range of motion, Decreased balance, Decreased mobility, Difficulty walking, Decreased activity tolerance, Decreased strength, Decreased endurance, Impaired flexibility  Visit Diagnosis: Muscle weakness (generalized)  Difficulty in walking, not elsewhere classified  Unsteadiness on feet     Problem List Patient Active Problem List   Diagnosis Date Noted  . Vitamin D insufficiency 11/29/2016  . Hypogonadism in male 11/01/2016  . [place] 1 diabetes mellitus ([place]   . Hypothyroidism   . Hypertension   . Hyperlipidemia   . Closed fracture of right fibula and tibia 10/29/2016  .  Closed tibia fracture 10/29/2016  . Closed fracture of right tibial plafond with fibula involvement 10/29/2016  . Abnormal liver function tests 03/06/2013  . RASH AND OTHER NONSPECIFIC SKIN ERUPTION 10/07/2009  . SHOULDER PAIN, LEFT 12/27/2008  . Type I (juvenile type) diabetes mellitus without mention of complication, uncontrolled 12/03/2007  . HYPOTHYROIDISM 11/12/2007  . HYPERCHOLESTEROLEMIA 11/12/2007  . MULTIPLE SCLEROSIS 08/29/2007  . PROLIFERATIVE DIABETIC RETINOPATHY 08/29/2007  .  HYPERTENSION 12/25/2006    Willow Ora, PTA, [facility] [address], [place], [place] [address] [phone] 06/10/17, 12:10 PM   Name: TAMER BAUGHMAN MRN: [id number] Date of Birth: Jan 28, 1971

## 2017-06-13 ENCOUNTER — Ambulatory Visit: Payer: Medicare Other | Admitting: Physical Therapy

## 2017-06-13 ENCOUNTER — Encounter: Payer: Self-pay | Admitting: Physical Therapy

## 2017-06-13 DIAGNOSIS — M6281 Muscle weakness (generalized): Secondary | ICD-10-CM | POA: Diagnosis not present

## 2017-06-13 DIAGNOSIS — M25671 Stiffness of right ankle, not elsewhere classified: Secondary | ICD-10-CM | POA: Diagnosis not present

## 2017-06-13 DIAGNOSIS — R2681 Unsteadiness on feet: Secondary | ICD-10-CM

## 2017-06-13 DIAGNOSIS — R262 Difficulty in walking, not elsewhere classified: Secondary | ICD-10-CM

## 2017-06-13 NOTE — Therapy (Signed)
[facility] [address] [place], [place], [address] Phone: [phone]   Fax:  [phone]  Physical Therapy Treatment  Patient Details  Name: Johnathan Ross MRN: [id number] Date of Birth: 28-Jun-1970 Referring Provider: Altamese Amesti, MD   Encounter Date: 06/13/2017  PT End of Session - 06/13/17 0948    Visit Number  39    Number of Visits  43    Date for PT Re-Evaluation  06/24/17    Authorization Type  Medicare    PT Start Time  0846    PT Stop Time  0930    PT Time Calculation (min)  44 min    Activity Tolerance  Patient tolerated treatment well    Behavior During Therapy  [facility] for tasks assessed/performed       Past Medical History:  Diagnosis Date  . Hyperlipidemia   . Hypertension   . Hypogonadism in male 11/01/2016  . Hypothyroidism   . Multiple sclerosis ([place])   . 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 Type 1 diabetes mellitus ([place]) dx'd 1994  . Vitamin D insufficiency 11/29/2016    Past Surgical History:  Procedure Laterality Date  . EYE SURGERY Bilateral    "laser for diabetic retinopathy"  . FRACTURE SURGERY    . OPEN REDUCTION INTERNAL FIXATION (ORIF) TIBIA/FIBULA FRACTURE Right 10/30/2016  . ORIF ANKLE FRACTURE Left 2015  . ORIF TIBIA FRACTURE Right 10/30/2016   Procedure: OPEN REDUCTION INTERNAL FIXATION (ORIF) TIBIA FIBULA FRACTURE;  Surgeon: Altamese Adair, MD;  Location: [facility];  Service: Orthopedics;  Laterality: Right;  . RETINAL LASER PROCEDURE Bilateral    "for diabetic retinopathy"    There were no vitals filed for this visit.  Subjective Assessment - 06/13/17 0849    Subjective  No issues to report, no pain or soreness after last visit.  Was able to ambulate a lap and half around gym without stopping to rest.    Patient is accompained by:  Family member    Pertinent History  MS dx about 6 years ago    How long can you sit comfortably?  unlimited    How long can you stand  comfortably?  unlimited    How long can you walk comfortably?  household distances due to LE fatigue/weakness    Diagnostic tests  X-ray    Patient Stated Goals  Walk without AD    Currently in Pain?  No/denies                      [facility] Adult PT Treatment/Exercise - 06/13/17 0937      Ambulation/Gait   Ambulation/Gait  Yes    Ambulation/Gait Assistance  4: Min assist    Ambulation/Gait Assistance Details  Gait with LUE support only first in // bars x 6 laps transitioning to gait overground with SPC (quad tip) with significant improvement in endurance, R foot clearance and knee stability - no episodes of R foot catching but noted to have increased L knee genu recurvatum during gait.  Use of Bioness on RLE for R ankle DF and hamstring flexion    Ambulation Distance (Feet)  230 Feet    Assistive device  Straight cane    Gait Pattern  Step-through pattern;Decreased arm swing - left;Left genu recurvatum;Lateral hip instability    Ambulation Surface  Level;Indoor      Neuro Re-ed    Neuro Re-ed Details   Use of Bioness in training mode for NMR to R ankle  DF and hamstring activation during tall kneeling squats in midline x 10, lateral tall kneeling squats to L and R x 10 total, maintaining tall kneeling and trunk/postural control during 10 reps bilat UE flexion holding 5lb medicine ball x 10 reps, medicine ball twists to L and R (down by hips and back up to middle) x 10 total; transitioned to R and L half kneeling maintaining without UE support x 10 seconds x 2 reps each side with min-mod A for balance; pt with 2-3 LOB during half kneeling       Modalities   Modalities  Electrical Stimulation      Electrical Stimulation   Electrical Stimulation Location  functional estim to R anterior tibialis and R hamstring mm groups    Electrical Stimulation Action  R ankle DF and knee flexion, hip extension in closed and open chain    Electrical Stimulation Parameters  Settings saved in Tablet  1, steering lower electrode    Electrical Stimulation Goals  Strength;Neuromuscular facilitation             PT Education - 06/13/17 [phone]    Education provided  Yes    Education Details  gait training with Bioness, AFO and cane    Person(s) Educated  Patient;Parent(s)    Methods  Explanation;Demonstration    Comprehension  Need further instruction       PT Short Term Goals - 05/29/17 1708      PT SHORT TERM GOAL #1   Title  Pt will participate in further assessment for appropriate AFO (no AFO, current off the shelf AFO, or new AFO?) to decrease risk for falls.    Baseline  05/23/17: pt still is not fully commited to an AFO. Have been trialing Bioness in past few sessions. Continued to trial and discuss on 05/29/17    Status  Partially Met      PT SHORT TERM GOAL #2   Title  Pt will ambulate 250' on level surfaces with cane with min A    Baseline  Ambulated 230' + 115' on level surface with cane with tripod tip with min-mod A    Status  Partially Met      PT SHORT TERM GOAL #3   Title  Pt will improve R ankle DF ROM to >12 deg and increase PF >40 deg    Baseline  05/23/17: right ankle AROM -6 DF, 38 PF, PROM 15 DF,  55 PF.  met goals with PROM only    Status  Achieved      PT SHORT TERM GOAL #4   Title  Pt will demonstrate improved LE strength as indicated by decrease in five time sit to stand < or = 12 seconds with one UE support    Baseline  05/23/17: 10.60 sec's with UE support from standard height chair    Status  Achieved      PT SHORT TERM GOAL #5   Title  Pt will improve standing balance as indicated by increase in BERG balance score to > or = 45/56    Baseline  05/23/17: 44/56 scored today, improved from 42/56 just not to goal    Status  Partially Met      PT SHORT TERM GOAL #6   Title  Pt will improve safety with gait as indicated by increase in gait velocity to 1.62f/sec with cane    Baseline  05/23/17: 2.36 ft/sec with RW; 0.93 ft/sec with cane    Status   Partially Met  PT Long Term Goals - 05/29/17 1714      PT LONG TERM GOAL #1   Title  Pt will ambulate 250' on indoor surfaces and will negotiate 5 stairs with one rail, with cane/most appropriate AFO with supervision    Baseline  11/29: Straight cane with tripod tip for 230' with Min Assist needed, is walking short distances (~100-200 feet) with RW with supervision, taking rest breaks as needed (standing rest breaks)    Status  Revised    Target Date  06/24/17      PT LONG TERM GOAL #3   Title  Pt will improve LE strength as indicated by improvement in five time sit to stand to < or = 10 seconds    Baseline  10.6 seconds at 4 weeks    Status  Achieved      PT LONG TERM GOAL #5   Title  Pt will decrease falls risk as indicated by increase in BERG score to > or = 46/56    Baseline  44/56 at 4 weeks    Status  Revised    Target Date  06/24/17      PT LONG TERM GOAL #6   Title  Pt will demonstrate ability to perform standing balance and strengthening HEP safely MOD I    Baseline  04/25/17: continues to need cues/assist to decrease compensatory movements    Status  Revised    Target Date  06/24/17      PT LONG TERM GOAL #7   Title  Pt will improve gait velocity to > or = 2.6 ft/sec with RW and >1.0 ft/sec with cane and appropriate AFO     Baseline  2.36 with RW at 4 weeks; 0.44f/sec with cane    Status  Revised    Target Date  06/24/17            Plan - 06/13/17 0948    Clinical Impression Statement  Continued use of Bioness on RLE for NMR in more challenging positions and with dynamic UE movement/strengthening for increased proximal stability during distal UE and LE movement.  Continued gait assesment and training with one UE support with use of AFO and BIONESS with significant improvement in endurance, efficiency of gait sequence and trunk control.  Due to LLE genu recurvatum may begin use of Bioness on LLE for strengthening.    Rehab Potential  Good    Clinical  Impairments Affecting Rehab Potential  MS     PT Frequency  2x / week    PT Duration  8 weeks    PT Treatment/Interventions  ADLs/Self Care Home Management;Cryotherapy;Vasopneumatic Device;Taping;Gait training;Stair training;Functional mobility training;Therapeutic activities;Therapeutic exercise;Balance training;Neuromuscular re-education;Passive range of motion;Manual techniques;Patient/family education;Electrical Stimulation;DME Instruction;Orthotic Fit/Training;Energy conservation    PT Next Visit Plan  LTG and recertification due by 1/28; assess LLE - major weakness in DF or hamstring causing recurvatum?  May need to add thigh cuff to L side for strengthening.  Continue trunk control training in tall and half kneeling, physioball, quadruped.  Gait training with cane, bioness and AFO    PT Home Exercise Plan  Pt has program at home for strengthening and balance    Consulted and Agree with Plan of Care  Patient    Family Member Consulted  mom       Patient will benefit from skilled therapeutic intervention in order to improve the following deficits and impairments:  Abnormal gait, Pain, Decreased range of motion, Decreased balance, Decreased mobility, Difficulty walking, Decreased activity  tolerance, Decreased strength, Decreased endurance, Impaired flexibility  Visit Diagnosis: Muscle weakness (generalized)  Difficulty in walking, not elsewhere classified  Unsteadiness on feet     Problem List Patient Active Problem List   Diagnosis Date Noted  . Vitamin D insufficiency 11/29/2016  . Hypogonadism in male 11/01/2016  . Type 1 diabetes mellitus ([place])   . Hypothyroidism   . Hypertension   . Hyperlipidemia   . Closed fracture of right fibula and tibia 10/29/2016  . Closed tibia fracture 10/29/2016  . Closed fracture of right tibial plafond with fibula involvement 10/29/2016  . Abnormal liver function tests 03/06/2013  . RASH AND OTHER NONSPECIFIC SKIN ERUPTION 10/07/2009  .  SHOULDER PAIN, LEFT 12/27/2008  . Type I (juvenile type) diabetes mellitus without mention of complication, uncontrolled 12/03/2007  . HYPOTHYROIDISM 11/12/2007  . HYPERCHOLESTEROLEMIA 11/12/2007  . MULTIPLE SCLEROSIS 08/29/2007  . PROLIFERATIVE DIABETIC RETINOPATHY 08/29/2007  . HYPERTENSION 12/25/2006    [name], PT, DPT 06/13/17    9:55 AM    [facility] [address] [place], [place], [address] Phone: [phone]   Fax:  [phone]  Name: OLLY SHINER MRN: [id number] Date of Birth: 11/02/1970

## 2017-06-17 ENCOUNTER — Ambulatory Visit: Payer: Medicare Other | Admitting: Rehabilitative and Restorative Service Providers"

## 2017-06-17 ENCOUNTER — Encounter: Payer: Self-pay | Admitting: Rehabilitative and Restorative Service Providers"

## 2017-06-17 DIAGNOSIS — R262 Difficulty in walking, not elsewhere classified: Secondary | ICD-10-CM

## 2017-06-17 DIAGNOSIS — M25671 Stiffness of right ankle, not elsewhere classified: Secondary | ICD-10-CM | POA: Diagnosis not present

## 2017-06-17 DIAGNOSIS — R2681 Unsteadiness on feet: Secondary | ICD-10-CM | POA: Diagnosis not present

## 2017-06-17 DIAGNOSIS — M6281 Muscle weakness (generalized): Secondary | ICD-10-CM

## 2017-06-17 NOTE — Therapy (Signed)
[facility] [address] [place], [place], [address] Phone: [phone]   Fax:  [phone]  Physical Therapy Treatment  Patient Details  Name: Johnathan Ross MRN: [id number] Date of Birth: 11/16/1970 Referring Provider: Altamese Franklin, MD   Encounter Date: 06/17/2017  PT End of Session - 06/17/17 1731    Visit Number  40    Number of Visits  43    Date for PT Re-Evaluation  06/24/17    Authorization Type  Medicare    PT Start Time  0848    PT Stop Time  0933    PT Time Calculation (min)  45 min    Equipment Utilized During Treatment  Gait belt    Activity Tolerance  Patient tolerated treatment well    Behavior During Therapy  [facility] for tasks assessed/performed       Past Medical History:  Diagnosis Date  . Hyperlipidemia   . Hypertension   . Hypogonadism in male 11/01/2016  . Hypothyroidism   . Multiple sclerosis ([place])   . 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 Type 1 diabetes mellitus ([place]) dx'd 1994  . Vitamin D insufficiency 11/29/2016    Past Surgical History:  Procedure Laterality Date  . EYE SURGERY Bilateral    "laser for diabetic retinopathy"  . FRACTURE SURGERY    . OPEN REDUCTION INTERNAL FIXATION (ORIF) TIBIA/FIBULA FRACTURE Right 10/30/2016  . ORIF ANKLE FRACTURE Left 2015  . ORIF TIBIA FRACTURE Right 10/30/2016   Procedure: OPEN REDUCTION INTERNAL FIXATION (ORIF) TIBIA FIBULA FRACTURE;  Surgeon: Altamese [place], MD;  Location: [facility];  Service: Orthopedics;  Laterality: Right;  . RETINAL LASER PROCEDURE Bilateral    "for diabetic retinopathy"    There were no vitals filed for this visit.  Subjective Assessment - 06/17/17 0850    Subjective  The patient notes exercises are going okay, he rested 1/2 way back to gym due to fatigue.  The patient notes that nothing seems to help-- he doesn't use AFO because he doesn't see the benefit.  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  He also reports variability day to day.    Patient is accompained by:  Family member    Pertinent History  MS dx about 6 years ago    Patient Stated Goals  Walk without AD;  On 06/17/17 patient states "for me, coming off of that (RW) is how to measure improvement".     Currently in Pain?  No/denies         [facility] PT Assessment - 06/17/17 0905      Ambulation/Gait   Ambulation/Gait Assistance Details  Supervision with RW, CGA to min A in parallel bars without UE support ambulating forward/backwards 10 ft at a time x 6 reps.  "Squat walking" with RW x 20 ft for co-contraction of knee musculature in closed chain activities.   RW x 115 feet with 1 standing rest break when fatigued.    Ambulation Distance (Feet)  115 Feet 100 x 2, + above    Assistive device  Rolling walker none in parallel bars    Ambulation Surface  Level;Indoor      Berg Balance Test   Sit to Stand  Able to stand without using hands and stabilize independently    Standing Unsupported  Able to stand safely 2 minutes    Sitting with Back Unsupported but Feet Supported on Floor or Stool  Able to sit safely and securely  2 minutes    Stand to Sit  Sits safely with minimal use of hands    Transfers  Able to transfer safely, minor use of hands    Standing Unsupported with Eyes Closed  Able to stand 10 seconds safely    Standing Ubsupported with Feet Together  Needs help to attain position but able to stand for 30 seconds with feet together    From Standing, Reach Forward with Outstretched Arm  Can reach confidently >25 cm (10")    From Standing Position, Pick up Object from Floor  Able to pick up shoe safely and easily    From Standing Position, Turn to Look Behind Over each Shoulder  Turn sideways only but maintains balance    Turn 360 Degrees  Able to turn 360 degrees safely but slowly    Standing Unsupported, Alternately Place Feet on Step/Stool  Able to complete 4 steps without aid or supervision    Standing  Unsupported, One Foot in Front  Able to plae foot ahead of the other independently and hold 30 seconds    Standing on One Leg  Able to lift leg independently and hold equal to or more than 3 seconds    Total Score  44    Berg comment:  44/56                  [facility] Adult PT Treatment/Exercise - 06/17/17 0905      Ambulation/Gait   Ambulation/Gait  Yes    Ambulation/Gait Assistance  5: Supervision;4: Min assist    Gait Comments  Backwards walking emphasizing knee flexion with R LE to initiate posterior stepping.      Neuro Re-ed    Neuro Re-ed Details   Quadriped with alternating LE hip extension x 5 reps, hip extension + knee flexion x 3 reps before fatiguing on the right and 5 reps on the left.  Tall kneeling with core tactile cues working on scapular depression and postural lengthening.  Rocker board emphasizing anterior/posterior sway and balance recovery with reactive balance with min A.  Stepping anteriorly off rocker board and return to upright.               PT Short Term Goals - 05/29/17 1708      PT SHORT TERM GOAL #1   Title  Pt will participate in further assessment for appropriate AFO (no AFO, current off the shelf AFO, or new AFO?) to decrease risk for falls.    Baseline  05/23/17: pt still is not fully commited to an AFO. Have been trialing Bioness in past few sessions. Continued to trial and discuss on 05/29/17    Status  Partially Met      PT SHORT TERM GOAL #2   Title  Pt will ambulate 250' on level surfaces with cane with min A    Baseline  Ambulated 230' + 115' on level surface with cane with tripod tip with min-mod A    Status  Partially Met      PT SHORT TERM GOAL #3   Title  Pt will improve R ankle DF ROM to >12 deg and increase PF >40 deg    Baseline  05/23/17: right ankle AROM -6 DF, 38 PF, PROM 15 DF,  55 PF.  met goals with PROM only    Status  Achieved      PT SHORT TERM GOAL #4   Title  Pt will demonstrate improved LE strength as  indicated by decrease in  five time sit to stand < or = 12 seconds with one UE support    Baseline  05/23/17: 10.60 sec's with UE support from standard height chair    Status  Achieved      PT SHORT TERM GOAL #5   Title  Pt will improve standing balance as indicated by increase in BERG balance score to > or = 45/56    Baseline  05/23/17: 44/56 scored today, improved from 42/56 just not to goal    Status  Partially Met      PT SHORT TERM GOAL #6   Title  Pt will improve safety with gait as indicated by increase in gait velocity to 1.67f/sec with cane    Baseline  05/23/17: 2.36 ft/sec with RW; 0.93 ft/sec with cane    Status  Partially Met        PT Long Term Goals - 06/17/17 0851      PT LONG TERM GOAL #1   Title  Pt will ambulate 250' on indoor surfaces and will negotiate 5 stairs with one rail, with cane/most appropriate AFO with supervision    Baseline  11/29: Straight cane with tripod tip for 230' with Min Assist needed, is walking short distances (~100-200 feet) with RW with supervision, taking rest breaks as needed (standing rest breaks)    Status  Revised    Target Date  06/24/17      PT LONG TERM GOAL #3   Title  Pt will improve LE strength as indicated by improvement in five time sit to stand to < or = 10 seconds    Baseline  10.6 seconds at 4 weeks    Status  Achieved      PT LONG TERM GOAL #5   Title  Pt will decrease falls risk as indicated by increase in BERG score to > or = 46/56    Baseline  06/17/2017:  44/56     Status  Revised      PT LONG TERM GOAL #6   Title  Pt will demonstrate ability to perform standing balance and strengthening HEP safely MOD I    Baseline  04/25/17: continues to need cues/assist to decrease compensatory movements    Status  Revised      PT LONG TERM GOAL #7   Title  Pt will improve gait velocity to > or = 2.6 ft/sec with RW and >1.0 ft/sec with cane and appropriate AFO     Baseline  2.36 with RW at 4 weeks; 0.932fsec with cane     Status  Revised            Plan - 06/17/17 1742    Clinical Impression Statement  The patient reports today that he is not using his AFO at home and doesn't note any improvements with mobility.  He states that bioness, AFO, exercises are not helping.  He reports a recent decline in function over the past month, specifically related to knee stability with gait.  He is able to control recurvatum with focused effort, however during periods of fatigue, he reverts to knee recurvatum.  PT to continue checking LTGs.    PT Treatment/Interventions  ADLs/Self Care Home Management;Cryotherapy;Vasopneumatic Device;Taping;Gait training;Stair training;Functional mobility training;Therapeutic activities;Therapeutic exercise;Balance training;Neuromuscular re-education;Passive range of motion;Manual techniques;Patient/family education;Electrical Stimulation;DME Instruction;Orthotic Fit/Training;Energy conservation    PT Next Visit Plan  LTG and recertification due by 1/28; assess LLE - major weakness in DF or hamstring causing recurvatum?  May need to add thigh cuff to L  side for strengthening.  Continue trunk control training in tall and half kneeling, physioball, quadruped.  Gait training with cane, bioness and AFO    Consulted and Agree with Plan of Care  Patient    Family Member Consulted  mom       Patient will benefit from skilled therapeutic intervention in order to improve the following deficits and impairments:  Abnormal gait, Pain, Decreased range of motion, Decreased balance, Decreased mobility, Difficulty walking, Decreased activity tolerance, Decreased strength, Decreased endurance, Impaired flexibility  Visit Diagnosis: Muscle weakness (generalized)  Difficulty in walking, not elsewhere classified  Unsteadiness on feet     Problem List Patient Active Problem List   Diagnosis Date Noted  . Vitamin D insufficiency 11/29/2016  . Hypogonadism in male 11/01/2016  . Type 1 diabetes mellitus  ([place])   . Hypothyroidism   . Hypertension   . Hyperlipidemia   . Closed fracture of right fibula and tibia 10/29/2016  . Closed tibia fracture 10/29/2016  . Closed fracture of right tibial plafond with fibula involvement 10/29/2016  . Abnormal liver function tests 03/06/2013  . RASH AND OTHER NONSPECIFIC SKIN ERUPTION 10/07/2009  . SHOULDER PAIN, LEFT 12/27/2008  . Type I (juvenile type) diabetes mellitus without mention of complication, uncontrolled 12/03/2007  . HYPOTHYROIDISM 11/12/2007  . HYPERCHOLESTEROLEMIA 11/12/2007  . MULTIPLE SCLEROSIS 08/29/2007  . PROLIFERATIVE DIABETIC RETINOPATHY 08/29/2007  . HYPERTENSION 12/25/2006    Idalis Hoelting, PT 06/17/2017, 5:51 PM  [facility] [address] [place], [place], [address] Phone: [phone]   Fax:  [phone]  Name: KIMANI HOVIS MRN: [id number] Date of Birth: 12-Aug-1970

## 2017-06-20 ENCOUNTER — Encounter: Payer: Self-pay | Admitting: Physical Therapy

## 2017-06-20 ENCOUNTER — Ambulatory Visit: Payer: Medicare Other | Admitting: Physical Therapy

## 2017-06-20 DIAGNOSIS — M6281 Muscle weakness (generalized): Secondary | ICD-10-CM | POA: Diagnosis not present

## 2017-06-20 DIAGNOSIS — R262 Difficulty in walking, not elsewhere classified: Secondary | ICD-10-CM | POA: Diagnosis not present

## 2017-06-20 DIAGNOSIS — R2681 Unsteadiness on feet: Secondary | ICD-10-CM | POA: Diagnosis not present

## 2017-06-20 DIAGNOSIS — M25671 Stiffness of right ankle, not elsewhere classified: Secondary | ICD-10-CM | POA: Diagnosis not present

## 2017-06-20 NOTE — Therapy (Signed)
[facility] [address] [place], [place], [address] Phone: [phone]   Fax:  [phone]  Physical Therapy Treatment  Patient Details  Name: Johnathan Ross MRN: [id number] Date of Birth: Oct 25, 1970 Referring Provider: Altamese [place], MD   Encounter Date: 06/20/2017  PT End of Session - 06/20/17 1303    Visit Number  41    Number of Visits  43    Date for PT Re-Evaluation  06/24/17    Authorization Type  Medicare    PT Start Time  0847    PT Stop Time  0937    PT Time Calculation (min)  50 min    Activity Tolerance  Patient tolerated treatment well    Behavior During Therapy  [facility] for tasks assessed/performed       Past Medical History:  Diagnosis Date  . Hyperlipidemia   . Hypertension   . Hypogonadism in male 11/01/2016  . Hypothyroidism   . Multiple sclerosis ([place])   . 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 Type 1 diabetes mellitus ([place]) dx'd 1994  . Vitamin D insufficiency 11/29/2016    Past Surgical History:  Procedure Laterality Date  . EYE SURGERY Bilateral    "laser for diabetic retinopathy"  . FRACTURE SURGERY    . OPEN REDUCTION INTERNAL FIXATION (ORIF) TIBIA/FIBULA FRACTURE Right 10/30/2016  . ORIF ANKLE FRACTURE Left 2015  . ORIF TIBIA FRACTURE Right 10/30/2016   Procedure: OPEN REDUCTION INTERNAL FIXATION (ORIF) TIBIA FIBULA FRACTURE;  Surgeon: Altamese Cameron, MD;  Location: [facility];  Service: Orthopedics;  Laterality: Right;  . RETINAL LASER PROCEDURE Bilateral    "for diabetic retinopathy"    There were no vitals filed for this visit.  Subjective Assessment - 06/20/17 0852    Subjective  Last session, "went okay".  No issues to report.  Initiated discussion regarding continuation of therapy and use of Bioness, medication, contacting MD regarding LLE weakness.    Patient is accompained by:  Family member    Pertinent History  MS dx about 6 years ago    Patient Stated Goals  Walk  without AD;  On 06/17/17 patient states "for me, coming off of that (RW) is how to measure improvement".     Currently in Pain?  No/denies         [facility] PT Assessment - 06/20/17 0938      Assessment   Medical Diagnosis  R tibia/fibula fx, s/p ORIF    Referring Provider  Altamese [place], MD    Onset Date/Surgical Date  10/30/16    Hand Dominance  Right      Precautions   Precautions  Fall    Other Brace/Splint  AFO R foot      Prior Function   Level of Independence  Independent      Observation/Other Assessments   Focus on Therapeutic Outcomes (FOTO)   45% limitation      Strength   Right Knee Flexion  4/5    Left Knee Flexion  4-/5    Right Ankle Dorsiflexion  4/5    Left Ankle Dorsiflexion  4-/5      Ambulation/Gait   Ambulation/Gait  Yes    Ambulation/Gait Assistance  5: Supervision;4: Min assist    Ambulation/Gait Assistance Details  supervision with RW, min A with cane without use of AFO with one episode of R toe catching on floor requiring assistance to regain balance    Ambulation Distance (Feet)  120 Feet    Assistive device  Rolling walker;Straight cane    Gait Pattern  Step-through pattern;Decreased dorsiflexion - right;Right genu recurvatum;Left genu recurvatum;Poor foot clearance - right    Ambulation Surface  Level;Indoor      Standardized Balance Assessment   10 Meter Walk  11.6 sec with RW or 2.8 ft/sec and 14.3 sec with cane or 2.29 ft/sec      Berg Balance Test   Sit to Stand  Able to stand without using hands and stabilize independently    Standing Unsupported  Able to stand safely 2 minutes    Sitting with Back Unsupported but Feet Supported on Floor or Stool  Able to sit safely and securely 2 minutes    Stand to Sit  Sits safely with minimal use of hands    Transfers  Able to transfer safely, minor use of hands    Standing Unsupported with Eyes Closed  Able to stand 10 seconds safely    Standing Ubsupported with Feet Together  Able to place feet  together independently but unable to hold for 30 seconds    From Standing, Reach Forward with Outstretched Arm  Can reach confidently >25 cm (10")    From Standing Position, Pick up Object from Floor  Able to pick up shoe safely and easily    From Standing Position, Turn to Look Behind Over each Shoulder  Looks behind one side only/other side shows less weight shift    Turn 360 Degrees  Able to turn 360 degrees safely but slowly    Standing Unsupported, Alternately Place Feet on Step/Stool  Able to complete 4 steps without aid or supervision    Standing Unsupported, One Foot in Front  Able to plae foot ahead of the other independently and hold 30 seconds    Standing on One Leg  Able to lift leg independently and hold equal to or more than 3 seconds    Total Score  46    Berg comment:  40/56                  [facility] Adult PT Treatment/Exercise - 06/20/17 0904      Self-Care   Self-Care  Other Self-Care Comments    Other Self-Care Comments   long discussion with pt and mother regarding patient's goals for therapy and alignment with therapy goals, evidence of improvement and progress in the clinic and at home, continued use of Bioness, functional endurance during exercises/energy conservation and gradual increase of activity level to minimize over-exertion, pt's current use of Ampyra, and patient's perception of LLE weakness and need to discuss with neurologist.  Pt states he would like to continue with therapy and states he does notice an improvement in his gait after leaving therapy but it doesn't seem to last into the next day.  He does notice the L knee popping back more but doesn't feel it is getting weaker or that he is in a MS flair ("I know what that feels like"), he also does not feel the need to contact MD because "he told me to only come back if I am having multiple days of significant, new weakness".  He states they do imaging intermittently to monitor for new lesions.  Will  continue to monitor.             PT Education - 06/20/17 1249    Education provided  Yes    Education Details  see self care; discussed progress, goals and focus of treatment    Person(s) Educated  Patient;Parent(s)  Methods  Explanation    Comprehension  Verbalized understanding       PT Short Term Goals - 05/29/17 1708      PT SHORT TERM GOAL #1   Title  Pt will participate in further assessment for appropriate AFO (no AFO, current off the shelf AFO, or new AFO?) to decrease risk for falls.    Baseline  05/23/17: pt still is not fully commited to an AFO. Have been trialing Bioness in past few sessions. Continued to trial and discuss on 05/29/17    Status  Partially Met      PT SHORT TERM GOAL #2   Title  Pt will ambulate 250' on level surfaces with cane with min A    Baseline  Ambulated 230' + 115' on level surface with cane with tripod tip with min-mod A    Status  Partially Met      PT SHORT TERM GOAL #3   Title  Pt will improve R ankle DF ROM to >12 deg and increase PF >40 deg    Baseline  05/23/17: right ankle AROM -6 DF, 38 PF, PROM 15 DF,  55 PF.  met goals with PROM only    Status  Achieved      PT SHORT TERM GOAL #4   Title  Pt will demonstrate improved LE strength as indicated by decrease in five time sit to stand < or = 12 seconds with one UE support    Baseline  05/23/17: 10.60 sec's with UE support from standard height chair    Status  Achieved      PT SHORT TERM GOAL #5   Title  Pt will improve standing balance as indicated by increase in BERG balance score to > or = 45/56    Baseline  05/23/17: 44/56 scored today, improved from 42/56 just not to goal    Status  Partially Met      PT SHORT TERM GOAL #6   Title  Pt will improve safety with gait as indicated by increase in gait velocity to 1.31f/sec with cane    Baseline  05/23/17: 2.36 ft/sec with RW; 0.93 ft/sec with cane    Status  Partially Met        PT Long Term Goals - 06/20/17 1304      PT  LONG TERM GOAL #1   Title  Pt will ambulate 250' on indoor surfaces and will negotiate 5 stairs with one rail, with cane/most appropriate AFO with supervision    Baseline  --    Status  On-going      PT LONG TERM GOAL #3   Title  Pt will improve LE strength as indicated by improvement in five time sit to stand to < or = 10 seconds    Baseline  10.6 seconds at 4 weeks    Status  Achieved      PT LONG TERM GOAL #5   Title  Pt will decrease falls risk as indicated by increase in BERG score to > or = 46/56    Baseline  46/56    Status  Achieved      PT LONG TERM GOAL #6   Title  Pt will demonstrate ability to perform standing balance and strengthening HEP safely MOD I    Baseline  MOD I with UE support    Status  Achieved      PT LONG TERM GOAL #7   Title  Pt will improve gait velocity to > or =  2.6 ft/sec with RW and >1.0 ft/sec with cane and appropriate AFO     Baseline  2.8 ft/sec with RW, 2.29 ft/sec with cane without AFO    Status  Achieved            Plan - 06/20/17 2132    Clinical Impression Statement  Treatment session today focused on intiating re-assessment of progress towards LTG and discussion with pt and mother regarding patient vs. therapist's perception of progress and goals, weakness in LLE, treatment progression, use of BIONESS and medication compliance.  Overall, pt feels he does not see much carryover at home once he leaves therapy but does feel it is beneficial and is helping and he would like to continue with therapy and use of Bioness.  Will continue to discuss use of AFO for more efficient and safe gait.  Pt is taking medication prescribed by neurologist but isn't sure if he sees much improvement with it.  Pt did not feel the weakness in his LLE was getting worse or warranted a phone call or early visit to neurologist.  Will continue to monitor.  [place]pone re-assessment of LE strength, both R and LLE demonstrated improvement in hamstring and ankle DF strength with  MMT.  Pt has also demonstrated improvements in standing balance and gait speed with RW and cane.  Will complete assessment of LTG #1 on Monday and plan to recertify for further visits.    PT Treatment/Interventions  ADLs/Self Care Home Management;Cryotherapy;Vasopneumatic Device;Taping;Gait training;Stair training;Functional mobility training;Therapeutic activities;Therapeutic exercise;Balance training;Neuromuscular re-education;Passive range of motion;Manual techniques;Patient/family education;Electrical Stimulation;DME Instruction;Orthotic Fit/Training;Energy conservation    PT Next Visit Plan  Please finish checking LTG #1 and route note to me for recertification; needs to schedule more visits; 2x/week x 8 more with AP/KB.  Review and revise current HEP - add/progress standing exercises decreasing UE support.  Continue balance training on rocker board; trunk control training in tall and half kneeling, gait with cane and AFO; Bioness?    Consulted and Agree with Plan of Care  Patient    Family Member Consulted  mom       Patient will benefit from skilled therapeutic intervention in order to improve the following deficits and impairments:  Abnormal gait, Pain, Decreased range of motion, Decreased balance, Decreased mobility, Difficulty walking, Decreased activity tolerance, Decreased strength, Decreased endurance, Impaired flexibility  Visit Diagnosis: Muscle weakness (generalized)  Difficulty in walking, not elsewhere classified  Unsteadiness on feet     Problem List Patient Active Problem List   Diagnosis Date Noted  . Vitamin D insufficiency 11/29/2016  . Hypogonadism in male 11/01/2016  . Type 1 diabetes mellitus ([place])   . Hypothyroidism   . Hypertension   . Hyperlipidemia   . Closed fracture of right fibula and tibia 10/29/2016  . Closed tibia fracture 10/29/2016  . Closed fracture of right tibial plafond with fibula involvement 10/29/2016  . Abnormal liver function tests  03/06/2013  . RASH AND OTHER NONSPECIFIC SKIN ERUPTION 10/07/2009  . SHOULDER PAIN, LEFT 12/27/2008  . Type I (juvenile type) diabetes mellitus without mention of complication, uncontrolled 12/03/2007  . HYPOTHYROIDISM 11/12/2007  . HYPERCHOLESTEROLEMIA 11/12/2007  . MULTIPLE SCLEROSIS 08/29/2007  . PROLIFERATIVE DIABETIC RETINOPATHY 08/29/2007  . HYPERTENSION 12/25/2006    [name], PT, DPT 06/20/17    9:41 PM    [facility] [address] [place], [place], [address] Phone: [phone]   Fax:  [phone]  Name: Johnathan Ross MRN: [id number] Date of  Birth: 04/08/1971

## 2017-06-24 ENCOUNTER — Ambulatory Visit: Payer: Medicare Other | Admitting: Physical Therapy

## 2017-06-24 ENCOUNTER — Encounter: Payer: Self-pay | Admitting: Physical Therapy

## 2017-06-24 DIAGNOSIS — R2681 Unsteadiness on feet: Secondary | ICD-10-CM

## 2017-06-24 DIAGNOSIS — M25671 Stiffness of right ankle, not elsewhere classified: Secondary | ICD-10-CM

## 2017-06-24 DIAGNOSIS — M6281 Muscle weakness (generalized): Secondary | ICD-10-CM

## 2017-06-24 DIAGNOSIS — R262 Difficulty in walking, not elsewhere classified: Secondary | ICD-10-CM | POA: Diagnosis not present

## 2017-06-24 NOTE — Therapy (Signed)
[facility] [address] [place], [place], [address] Phone: [phone]   Fax:  [phone]  Physical Therapy Treatment  Patient Details  Name: Johnathan Ross MRN: [id number] Date of Birth: 1970-05-30 Referring Provider: Altamese Williams, MD   Encounter Date: 06/24/2017  PT End of Session - 06/24/17 1440    Visit Number  42    Number of Visits  43    Date for PT Re-Evaluation  06/24/17    Authorization Type  Medicare    PT Start Time  1345    PT Stop Time  1434    PT Time Calculation (min)  49 min    Activity Tolerance  Patient tolerated treatment well    Behavior During Therapy  [facility] for tasks assessed/performed       Past Medical History:  Diagnosis Date  . Hyperlipidemia   . Hypertension   . Hypogonadism in male 11/01/2016  . Hypothyroidism   . Multiple sclerosis ([place])   . 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 Type 1 diabetes mellitus ([place]) dx'd 1994  . Vitamin D insufficiency 11/29/2016    Past Surgical History:  Procedure Laterality Date  . EYE SURGERY Bilateral    "laser for diabetic retinopathy"  . FRACTURE SURGERY    . OPEN REDUCTION INTERNAL FIXATION (ORIF) TIBIA/FIBULA FRACTURE Right 10/30/2016  . ORIF ANKLE FRACTURE Left 2015  . ORIF TIBIA FRACTURE Right 10/30/2016   Procedure: OPEN REDUCTION INTERNAL FIXATION (ORIF) TIBIA FIBULA FRACTURE;  Surgeon: Altamese [place], MD;  Location: [facility];  Service: Orthopedics;  Laterality: Right;  . RETINAL LASER PROCEDURE Bilateral    "for diabetic retinopathy"    There were no vitals filed for this visit.  Subjective Assessment - 06/24/17 1348    Subjective  No issues to report, a little more fatigued today walking in with RW.  Asked pt about appointment with neuropsych - did not keep appointment in December but is planning on rescheduling.      Patient is accompained by:  Family member    Pertinent History  MS dx about 6 years ago    Patient Stated  Goals  Walk without AD;  On 06/17/17 patient states "for me, coming off of that (RW) is how to measure improvement".     Currently in Pain?  No/denies         [facility] PT Assessment - 06/24/17 1349      Ambulation/Gait   Ambulation/Gait  Yes    Ambulation/Gait Assistance  4: Min assist    Ambulation/Gait Assistance Details  with cane with quad tip and PLS without heel wedge with slight toe catching but no significant LOB that required therapist assistance to recover; did demonstrate increased R recurvatum that was slightly improved with heel wedge placed under AFO.    Ambulation Distance (Feet)  250 Feet    Assistive device  Straight cane    Gait Pattern  Step-through pattern;Decreased dorsiflexion - right;Right genu recurvatum;Left genu recurvatum;Poor foot clearance - right    Ambulation Surface  Level;Indoor    Stairs  Yes    Stairs Assistance  5: Supervision    Stairs Assistance Details (indicate cue type and reason)  reciprocal ascending and descending    Stair Management Technique  One rail Right;Alternating pattern;Forwards    Number of Stairs  8    Height of Stairs  6                  [facility] Adult PT Treatment/Exercise -  06/24/17 1349      Knee/Hip Exercises: Standing   Other Standing Knee Exercises  Walking forwards and retro while maintaining squat position without UE support but in // bars x 2 trips for increased activation of quads and hamstrings, weight shifting, balance and postural control          Balance Exercises - 06/24/17 1438      Balance Exercises: Standing   Rockerboard  Anterior/posterior;Lateral;EO;EC;Other time (comment);Intermittent UE support with intermittent perturbations to board ant/post/lat        PT Education - 06/24/17 1439    Education provided  Yes    Education Details  AFO, progress and plan for recertification    Person(s) Educated  Patient    Methods  Explanation    Comprehension  Verbalized understanding       PT Short  Term Goals - 05/29/17 1708      PT SHORT TERM GOAL #1   Title  Pt will participate in further assessment for appropriate AFO (no AFO, current off the shelf AFO, or new AFO?) to decrease risk for falls.    Baseline  05/23/17: pt still is not fully commited to an AFO. Have been trialing Bioness in past few sessions. Continued to trial and discuss on 05/29/17    Status  Partially Met      PT SHORT TERM GOAL #2   Title  Pt will ambulate 250' on level surfaces with cane with min A    Baseline  Ambulated 230' + 115' on level surface with cane with tripod tip with min-mod A    Status  Partially Met      PT SHORT TERM GOAL #3   Title  Pt will improve R ankle DF ROM to >12 deg and increase PF >40 deg    Baseline  05/23/17: right ankle AROM -6 DF, 38 PF, PROM 15 DF,  55 PF.  met goals with PROM only    Status  Achieved      PT SHORT TERM GOAL #4   Title  Pt will demonstrate improved LE strength as indicated by decrease in five time sit to stand < or = 12 seconds with one UE support    Baseline  05/23/17: 10.60 sec's with UE support from standard height chair    Status  Achieved      PT SHORT TERM GOAL #5   Title  Pt will improve standing balance as indicated by increase in BERG balance score to > or = 45/56    Baseline  05/23/17: 44/56 scored today, improved from 42/56 just not to goal    Status  Partially Met      PT SHORT TERM GOAL #6   Title  Pt will improve safety with gait as indicated by increase in gait velocity to 1.75f/sec with cane    Baseline  05/23/17: 2.36 ft/sec with RW; 0.93 ft/sec with cane    Status  Partially Met        PT Long Term Goals - 06/24/17 1440      PT LONG TERM GOAL #1   Title  Pt will ambulate 250' on indoor surfaces and will negotiate 5 stairs with one rail, with cane/most appropriate AFO with supervision    Baseline  supervision for stairs with one rail alternating sequence; min A for gait 250' with cane and AFO    Status  Partially Met      PT LONG TERM  GOAL #3   Title  Pt will  improve LE strength as indicated by improvement in five time sit to stand to < or = 10 seconds    Baseline  10.6 seconds at 4 weeks    Status  Achieved      PT LONG TERM GOAL #5   Title  Pt will decrease falls risk as indicated by increase in BERG score to > or = 46/56    Baseline  46/56    Status  Achieved      PT LONG TERM GOAL #6   Title  Pt will demonstrate ability to perform standing balance and strengthening HEP safely MOD I    Baseline  MOD I with UE support    Status  Achieved      PT LONG TERM GOAL #7   Title  Pt will improve gait velocity to > or = 2.6 ft/sec with RW and >1.0 ft/sec with cane and appropriate AFO     Baseline  2.8 ft/sec with RW, 2.29 ft/sec with cane without AFO    Status  Achieved            Plan - 06/24/17 1442    Clinical Impression Statement  Pt has made good progress and has met 4/5 LTG, partially meeting goal #1.  Pt is able to ambulate full indoor distance with cane with rest breaks but with min A for balance and safety due to intermittent foot drag and LOB.  Pt is demonstrating improvements in LE strength, postural control and balance, functional endurance and gait.  Pt continues to demonstrate R foot drag and genu recurvatum despite gains in mm strength and will likely benefit from new AFO.  Continued standing balance, balance reaction and LE strengthening training on unstable surface with decreased UE support.  Pt would benefit from ongoing skilled PT services to address impairments to maximize functional mobility impairments and improve safety, decrease falls risk with mobility.    PT Treatment/Interventions  ADLs/Self Care Home Management;Cryotherapy;Vasopneumatic Device;Taping;Gait training;Stair training;Functional mobility training;Therapeutic activities;Therapeutic exercise;Balance training;Neuromuscular re-education;Passive range of motion;Manual techniques;Patient/family education;Electrical Stimulation;DME  Instruction;Orthotic Fit/Training;Energy conservation    PT Next Visit Plan  Review and revise current HEP - add/progress standing exercises decreasing UE support.  Continue balance training on rocker board; squat walking, wall squats, trunk control training in tall and half kneeling, gait with cane and AFO; Bioness?    Consulted and Agree with Plan of Care  Patient    Family Member Consulted  mom       Patient will benefit from skilled therapeutic intervention in order to improve the following deficits and impairments:  Abnormal gait, Pain, Decreased range of motion, Decreased balance, Decreased mobility, Difficulty walking, Decreased activity tolerance, Decreased strength, Decreased endurance, Impaired flexibility  Visit Diagnosis: Muscle weakness (generalized)  Difficulty in walking, not elsewhere classified  Unsteadiness on feet  Stiffness of right ankle, not elsewhere classified     Problem List Patient Active Problem List   Diagnosis Date Noted  . Vitamin D insufficiency 11/29/2016  . Hypogonadism in male 11/01/2016  . Type 1 diabetes mellitus ([place])   . Hypothyroidism   . Hypertension   . Hyperlipidemia   . Closed fracture of right fibula and tibia 10/29/2016  . Closed tibia fracture 10/29/2016  . Closed fracture of right tibial plafond with fibula involvement 10/29/2016  . Abnormal liver function tests 03/06/2013  . RASH AND OTHER NONSPECIFIC SKIN ERUPTION 10/07/2009  . SHOULDER PAIN, LEFT 12/27/2008  . Type I (juvenile type) diabetes mellitus without mention of complication, uncontrolled 12/03/2007  .  HYPOTHYROIDISM 11/12/2007  . HYPERCHOLESTEROLEMIA 11/12/2007  . MULTIPLE SCLEROSIS 08/29/2007  . PROLIFERATIVE DIABETIC RETINOPATHY 08/29/2007  . HYPERTENSION 12/25/2006    [name], PT, DPT 06/24/17    2:47 PM    [facility] [address] [place], [place], [address] Phone: [phone]   Fax:   [phone]  Name: Johnathan Ross MRN: [id number] Date of Birth: Dec 14, 1970

## 2017-06-26 ENCOUNTER — Telehealth: Payer: Self-pay | Admitting: Endocrinology

## 2017-06-26 NOTE — Telephone Encounter (Signed)
levothyroxine (SYNTHROID) 175 MCG tablet  Pt states he has been on 150 MCG and when he went in to pick up it was moved to the 175. Pt would like to know why the dosage has changed because he does not remember discussing with the dr a dosage change  Please advise

## 2017-06-27 ENCOUNTER — Ambulatory Visit: Payer: Medicare Other | Admitting: Physical Therapy

## 2017-06-27 ENCOUNTER — Encounter: Payer: Self-pay | Admitting: Physical Therapy

## 2017-06-27 DIAGNOSIS — R2681 Unsteadiness on feet: Secondary | ICD-10-CM | POA: Diagnosis not present

## 2017-06-27 DIAGNOSIS — M6281 Muscle weakness (generalized): Secondary | ICD-10-CM

## 2017-06-27 DIAGNOSIS — M25671 Stiffness of right ankle, not elsewhere classified: Secondary | ICD-10-CM | POA: Diagnosis not present

## 2017-06-27 DIAGNOSIS — R262 Difficulty in walking, not elsewhere classified: Secondary | ICD-10-CM

## 2017-06-28 NOTE — Therapy (Signed)
[facility] [address] [place], [place], [address] Phone: [phone]   Fax:  [phone]  Physical Therapy Treatment  Patient Details  Name: Johnathan Ross MRN: [id number] Date of Birth: [birth year] Referring Provider: Altamese [place], MD   Encounter Date: 06/27/2017  PT End of Session - 06/27/17 1329    Visit Number  43    Number of Visits  85 per recertification    Date for PT Re-Evaluation  13/24/40 per recertification    Authorization Type  Medicare    PT Start Time  1027    PT Stop Time  1400    PT Time Calculation (min)  42 min    Equipment Utilized During Treatment  Gait belt    Activity Tolerance  Patient tolerated treatment well;Patient limited by fatigue    Behavior During Therapy  [facility] for tasks assessed/performed       Past Medical History:  Diagnosis Date  . Hyperlipidemia   . Hypertension   . Hypogonadism in male 11/01/2016  . Hypothyroidism   . Multiple sclerosis ([place])   . 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 Type 1 diabetes mellitus ([place]) dx'd 1994  . Vitamin D insufficiency 11/29/2016    Past Surgical History:  Procedure Laterality Date  . EYE SURGERY Bilateral    "laser for diabetic retinopathy"  . FRACTURE SURGERY    . OPEN REDUCTION INTERNAL FIXATION (ORIF) TIBIA/FIBULA FRACTURE Right 10/30/2016  . ORIF ANKLE FRACTURE Left 2015  . ORIF TIBIA FRACTURE Right 10/30/2016   Procedure: OPEN REDUCTION INTERNAL FIXATION (ORIF) TIBIA FIBULA FRACTURE;  Surgeon: Altamese [place], MD;  Location: [facility];  Service: Orthopedics;  Laterality: Right;  . RETINAL LASER PROCEDURE Bilateral    "for diabetic retinopathy"    There were no vitals filed for this visit.  Subjective Assessment - 06/27/17 1317    Subjective  No new complaints. No falls or pain to report.    Pertinent History  MS dx about 6 years ago    How long can you sit comfortably?  unlimited    How long can you stand comfortably?   unlimited    How long can you walk comfortably?  household distances due to LE fatigue/weakness    Diagnostic tests  X-ray    Patient Stated Goals  Walk without AD;  On 06/17/17 patient states "for me, coming off of that (RW) is how to measure improvement".     Currently in Pain?  No/denies          06/27/17 1335  Transfers  Transfers Sit to Stand;Stand to Sit  Sit to Stand 5: Supervision;With upper extremity assist  Stand to Sit 5: Supervision;With upper extremity assist  Ambulation/Gait  Ambulation/Gait Yes  Ambulation/Gait Assistance 4: Min assist;3: Mod assist  Ambulation/Gait Assistance Details 1st lap with cane with Bioness only to lower/upper limb. significant loss of balance forward needing mod assist for balance recovery due to right toe scuffing toward end. used RW for remainder of gait to allow more focus on improved gait fluency with decr deviations so not to fatigue pt. Tried a lap with just an AFO with genu recurvatum noted. Tried one lap with both Bioness and AFO as well with improvment in foot clearacne  Ambulation Distance (Feet) 115 Feet (x multiple laps)  Assistive device Straight cane;Rolling walker  Gait Pattern Step-through pattern;Decreased dorsiflexion - right;Right genu recurvatum;Left genu recurvatum;Poor foot clearance - right  Ambulation Surface Level;Indoor       PT Long Term  Goals - 06/24/17 2114      PT LONG TERM GOAL #1   Title  Will be independent with HEP with standing HEP    Time  8    Period  Weeks    Status  New    Target Date  08/23/17      PT LONG TERM GOAL #2   Title  Pt will ambulate x 250' outside on pavement and up/down curb with cane, AFO and min A    Time  8    Period  Weeks    Status  New    Target Date  08/23/17      PT LONG TERM GOAL #5   Title  Pt will decrease falls risk as indicated by increase in BERG score to > or = 50/56    Baseline  46/56    Time  8    Period  Weeks    Status  Revised    Target Date  08/23/17       PT LONG TERM GOAL #7   Title  Pt will improve gait velocity to > or = 2.8 ft/sec with cane and appropriate AFO     Time  8    Period  Weeks    Status  Revised    Target Date  08/23/17            Plan - 06/27/17 1508    Clinical Impression Statement  Today's skilled session focused on use of Bioness with gait for improved DF and decreased knee recurvatum. Compared the use of Bioness along, concurrent with an AFO and AFO alone. Bioness rep here as well and provided imput into setting for more efficient use of Bioneass. Pt did demo improved gait and LE control until fatigued. Pt is progressing toward goals and should benefit from continued PT to progress toward unmet goals.     Rehab Potential  Good    Clinical Impairments Affecting Rehab Potential  MS     PT Frequency  2x / week    PT Duration  8 weeks    PT Treatment/Interventions  ADLs/Self Care Home Management;Cryotherapy;Vasopneumatic Device;Taping;Gait training;Stair training;Functional mobility training;Therapeutic activities;Therapeutic exercise;Balance training;Neuromuscular re-education;Passive range of motion;Manual techniques;Patient/family education;Electrical Stimulation;DME Instruction;Orthotic Fit/Training;Energy conservation    PT Next Visit Plan  Review and revise current HEP - add/progress standing exercises decreasing UE support.  Continue balance training on rocker board; squat walking, wall squats, trunk control training in tall and half kneeling, gait with cane and AFO; Bioness?    Consulted and Agree with Plan of Care  Patient       Patient will benefit from skilled therapeutic intervention in order to improve the following deficits and impairments:  Abnormal gait, Pain, Decreased range of motion, Decreased balance, Decreased mobility, Difficulty walking, Decreased activity tolerance, Decreased strength, Decreased endurance, Impaired flexibility, Impaired sensation  Visit Diagnosis: Muscle weakness  (generalized)  Difficulty in walking, not elsewhere classified  Unsteadiness on feet     Problem List Patient Active Problem List   Diagnosis Date Noted  . Vitamin D insufficiency 11/29/2016  . Hypogonadism in male 11/01/2016  . Type 1 diabetes mellitus ([place])   . Hypothyroidism   . Hypertension   . Hyperlipidemia   . Closed fracture of right fibula and tibia 10/29/2016  . Closed tibia fracture 10/29/2016  . Closed fracture of right tibial plafond with fibula involvement 10/29/2016  . Abnormal liver function tests 03/06/2013  . RASH AND OTHER NONSPECIFIC SKIN ERUPTION 10/07/2009  . SHOULDER  PAIN, LEFT 12/27/2008  . Type I (juvenile type) diabetes mellitus without mention of complication, uncontrolled 12/03/2007  . HYPOTHYROIDISM 11/12/2007  . HYPERCHOLESTEROLEMIA 11/12/2007  . MULTIPLE SCLEROSIS 08/29/2007  . PROLIFERATIVE DIABETIC RETINOPATHY 08/29/2007  . HYPERTENSION 12/25/2006    Willow Ora, PTA, [facility] [address], [place], [place] [address] [phone] 06/28/17, 3:23 PM   Name: AMAR SIPPEL MRN: [id number] Date of Birth: November 29, 1970

## 2017-07-01 ENCOUNTER — Encounter: Payer: Self-pay | Admitting: Physical Therapy

## 2017-07-01 ENCOUNTER — Ambulatory Visit: Payer: Medicare Other | Attending: Orthopedic Surgery | Admitting: Physical Therapy

## 2017-07-01 DIAGNOSIS — R262 Difficulty in walking, not elsewhere classified: Secondary | ICD-10-CM | POA: Diagnosis not present

## 2017-07-01 DIAGNOSIS — M6281 Muscle weakness (generalized): Secondary | ICD-10-CM | POA: Diagnosis not present

## 2017-07-01 DIAGNOSIS — R2681 Unsteadiness on feet: Secondary | ICD-10-CM | POA: Diagnosis not present

## 2017-07-01 DIAGNOSIS — M25671 Stiffness of right ankle, not elsewhere classified: Secondary | ICD-10-CM | POA: Insufficient documentation

## 2017-07-01 NOTE — Telephone Encounter (Signed)
Called patient and left a voice message to let him know that I saw a message from Verlin Grills that she discussed the dosage change with him but he can call me back to let me know why the dosage change.

## 2017-07-01 NOTE — Therapy (Signed)
[facility] [address] [place], [place], [address] Phone: [phone]   Fax:  [phone]  Physical Therapy Treatment  Patient Details  Name: Johnathan Ross MRN: [id number] Date of Birth: Aug 03, 1970 Referring Provider: Altamese [place], MD   Encounter Date: 07/01/2017  PT End of Session - 07/01/17 0852    Visit Number  44    Number of Visits  68 per recertification    Date for PT Re-Evaluation  27/51/70 per recertification    Authorization Type  Medicare    PT Start Time  0850    PT Stop Time  0930    PT Time Calculation (min)  40 min    Equipment Utilized During Treatment  Gait belt    Activity Tolerance  Patient tolerated treatment well;Patient limited by fatigue    Behavior During Therapy  [facility] for tasks assessed/performed       Past Medical History:  Diagnosis Date  . Hyperlipidemia   . Hypertension   . Hypogonadism in male 11/01/2016  . Hypothyroidism   . Multiple sclerosis ([place])   . 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 Type 1 diabetes mellitus ([place]) dx'd 1994  . Vitamin D insufficiency 11/29/2016    Past Surgical History:  Procedure Laterality Date  . EYE SURGERY Bilateral    "laser for diabetic retinopathy"  . FRACTURE SURGERY    . OPEN REDUCTION INTERNAL FIXATION (ORIF) TIBIA/FIBULA FRACTURE Right 10/30/2016  . ORIF ANKLE FRACTURE Left 2015  . ORIF TIBIA FRACTURE Right 10/30/2016   Procedure: OPEN REDUCTION INTERNAL FIXATION (ORIF) TIBIA FIBULA FRACTURE;  Surgeon: Altamese [place], MD;  Location: [facility];  Service: Orthopedics;  Laterality: Right;  . RETINAL LASER PROCEDURE Bilateral    "for diabetic retinopathy"    There were no vitals filed for this visit.  Subjective Assessment - 07/01/17 0851    Subjective  No new complaints. No falls or pain to report.    Patient is accompained by:  Family member mom in lobby    Pertinent History  MS dx about 6 years ago    How long can you sit  comfortably?  unlimited    How long can you stand comfortably?  unlimited    How long can you walk comfortably?  household distances due to LE fatigue/weakness    Diagnostic tests  X-ray    Patient Stated Goals  Walk without AD;  On 06/17/17 patient states "for me, coming off of that (RW) is how to measure improvement".     Currently in Pain?  No/denies          [facility] Adult PT Treatment/Exercise - 07/01/17 0853      Ambulation/Gait   Ambulation/Gait  Yes    Ambulation/Gait Assistance  4: Min guard;5: Supervision    Ambulation/Gait Assistance Details  cues for increased hip/knee flexion with swing phase and for incr DF for heel strke with both LE's. continued to use Bioness to right LE for NMR and improved gait mechanics.     Ambulation Distance (Feet)  50 Feet x1, plus around gym    Assistive device  Rolling walker    Gait Pattern  Step-through pattern;Decreased dorsiflexion - right;Right genu recurvatum;Left genu recurvatum;Poor foot clearance - right    Ambulation Surface  Level;Indoor      Acupuncturist Location  functional estim to R anterior tibialis and R hamstring mm groups    Electrical Stimulation Action  to ant tib and HS on right LE  for mm strengthening, improved DF, decreased genu recurvatum in stance.     Electrical Stimulation Parameters  refer to tablet 1 for saved settings; steering electrodes    Electrical Stimulation Goals  Strength;Neuromuscular facilitation          Balance Exercises - 07/01/17 0926      Balance Exercises: Standing   SLS with Vectors  Solid surface;Upper extremity assist 1;5 reps;Limitations    Rockerboard  Anterior/posterior;Lateral;Head turns;EO;EC;30 seconds;10 reps    Balance Beam  standing across blue foam beam: alternating fwd stepping to floor/back onto beam, then alternating bwd stepping to floor/back onto beam. min assist with light UE support on bars for balance. cues on posture, weight shifting and  incr step length to clear beam surface.       Balance Exercises: Standing   SLS with Vectors Limitations  2 foam bubbles on floor: alternating toe taps x 5 reps each LE with min to mod single HHA for balance. cues on posture, weight shifting and stance position to assist with balance.     Rebounder Limitations  performed  both ways on balance  board with no UE support/intermittent touch to bars for balance: EO rocking board with emphasis on tall posture; holding board steady- EC no head movements, progressing to EC head movements left<>right, up<>down. min to mod assist for balance with cues on posture and weight shifitng to assist with balance.           PT Short Term Goals - 06/24/17 2107      PT SHORT TERM GOAL #1   Title  Pt wil participate in and receive appropriate AFO for home/community ambulation    Time  4    Period  Weeks    Status  Revised    Target Date  07/24/17      PT SHORT TERM GOAL #2   Title  Pt will ambulate 230' on level surfaces with cane and appropriate AFO with supervision    Time  4    Period  Weeks    Status  Revised    Target Date  07/24/17      PT SHORT TERM GOAL #5   Title  Pt will improve standing balance as indicated by increase in BERG balance score to > or = 50/56    Baseline  46/56    Time  4    Period  Weeks    Status  Revised    Target Date  07/24/17      PT SHORT TERM GOAL #6   Title  Pt will improve safety with gait as indicated by increase in gait velocity to >/= 2.68ft/sec with cane and AFO    Baseline  2.43ft/sec with cane    Time  4    Period  Weeks    Status  Revised    Target Date  07/24/17        PT Long Term Goals - 06/24/17 2114      PT LONG TERM GOAL #1   Title  Will be independent with HEP with standing HEP    Time  8    Period  Weeks    Status  New    Target Date  08/23/17      PT LONG TERM GOAL #2   Title  Pt will ambulate x 250' outside on pavement and up/down curb with cane, AFO and min A    Time  8    Period   Weeks    Status  New    Target Date  08/23/17      PT LONG TERM GOAL #5   Title  Pt will decrease falls risk as indicated by increase in BERG score to > or = 50/56    Baseline  46/56    Time  8    Period  Weeks    Status  Revised    Target Date  08/23/17      PT LONG TERM GOAL #7   Title  Pt will improve gait velocity to > or = 2.8 ft/sec with cane and appropriate AFO     Time  8    Period  Weeks    Status  Revised    Target Date  08/23/17            Plan - 07/01/17 5400    Clinical Impression Statement  Today's skilled session continued to use Bioness concurrent with gait and balance activities for mm strengthening/NMR. No issues reported in session. Pt is making steady progress and should benefit from continued PT to progress toward unmet goals.     Rehab Potential  Good    Clinical Impairments Affecting Rehab Potential  MS     PT Frequency  2x / week    PT Duration  8 weeks    PT Treatment/Interventions  ADLs/Self Care Home Management;Cryotherapy;Vasopneumatic Device;Taping;Gait training;Stair training;Functional mobility training;Therapeutic activities;Therapeutic exercise;Balance training;Neuromuscular re-education;Passive range of motion;Manual techniques;Patient/family education;Electrical Stimulation;DME Instruction;Orthotic Fit/Training;Energy conservation    PT Next Visit Plan  Review and revise current HEP - add/progress standing exercises decreasing UE support.  Continue balance training on rocker board; squat walking, wall squats, trunk control training in tall and half kneeling, gait with cane and AFO; Bioness?    Consulted and Agree with Plan of Care  Patient       Patient will benefit from skilled therapeutic intervention in order to improve the following deficits and impairments:  Abnormal gait, Pain, Decreased range of motion, Decreased balance, Decreased mobility, Difficulty walking, Decreased activity tolerance, Decreased strength, Decreased endurance,  Impaired flexibility, Impaired sensation  Visit Diagnosis: Muscle weakness (generalized)  Difficulty in walking, not elsewhere classified  Unsteadiness on feet     Problem List Patient Active Problem List   Diagnosis Date Noted  . Vitamin D insufficiency 11/29/2016  . Hypogonadism in male 11/01/2016  . Type 1 diabetes mellitus ([place])   . Hypothyroidism   . Hypertension   . Hyperlipidemia   . Closed fracture of right fibula and tibia 10/29/2016  . Closed tibia fracture 10/29/2016  . Closed fracture of right tibial plafond with fibula involvement 10/29/2016  . Abnormal liver function tests 03/06/2013  . RASH AND OTHER NONSPECIFIC SKIN ERUPTION 10/07/2009  . SHOULDER PAIN, LEFT 12/27/2008  . Type I (juvenile type) diabetes mellitus without mention of complication, uncontrolled 12/03/2007  . HYPOTHYROIDISM 11/12/2007  . HYPERCHOLESTEROLEMIA 11/12/2007  . MULTIPLE SCLEROSIS 08/29/2007  . PROLIFERATIVE DIABETIC RETINOPATHY 08/29/2007  . HYPERTENSION 12/25/2006    Willow Ora, PTA, [facility] [address], [place], [place] [address] [phone] 07/01/17, 9:57 AM   Name: Johnathan Ross MRN: [id number] Date of Birth: 01-May-1971

## 2017-07-03 ENCOUNTER — Encounter: Payer: Self-pay | Admitting: Physical Therapy

## 2017-07-03 ENCOUNTER — Ambulatory Visit: Payer: Medicare Other | Admitting: Physical Therapy

## 2017-07-03 DIAGNOSIS — R2681 Unsteadiness on feet: Secondary | ICD-10-CM

## 2017-07-03 DIAGNOSIS — R262 Difficulty in walking, not elsewhere classified: Secondary | ICD-10-CM | POA: Diagnosis not present

## 2017-07-03 DIAGNOSIS — M25671 Stiffness of right ankle, not elsewhere classified: Secondary | ICD-10-CM

## 2017-07-03 DIAGNOSIS — M6281 Muscle weakness (generalized): Secondary | ICD-10-CM

## 2017-07-03 NOTE — Therapy (Signed)
[facility] [address] [place], [place], [address] Phone: [phone]   Fax:  [phone]  Physical Therapy Treatment  Patient Details  Name: Johnathan Ross MRN: [id number] Date of Birth: 03-08-1971 Referring Provider: Altamese [place], MD   Encounter Date: 07/03/2017  PT End of Session - 07/03/17 1129    Visit Number  45    Number of Visits  61 per recertification    Date for PT Re-Evaluation  08/67/61 per recertification    Authorization Type  Medicare    PT Start Time  0848    PT Stop Time  0932    PT Time Calculation (min)  44 min    Activity Tolerance  Patient tolerated treatment well;Patient limited by fatigue    Behavior During Therapy  [facility] for tasks assessed/performed       Past Medical History:  Diagnosis Date  . Hyperlipidemia   . Hypertension   . Hypogonadism in male 11/01/2016  . Hypothyroidism   . Multiple sclerosis ([place])   . 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 Type 1 diabetes mellitus ([place]) dx'd 1994  . Vitamin D insufficiency 11/29/2016    Past Surgical History:  Procedure Laterality Date  . EYE SURGERY Bilateral    "laser for diabetic retinopathy"  . FRACTURE SURGERY    . OPEN REDUCTION INTERNAL FIXATION (ORIF) TIBIA/FIBULA FRACTURE Right 10/30/2016  . ORIF ANKLE FRACTURE Left 2015  . ORIF TIBIA FRACTURE Right 10/30/2016   Procedure: OPEN REDUCTION INTERNAL FIXATION (ORIF) TIBIA FIBULA FRACTURE;  Surgeon: Altamese [place], MD;  Location: [facility];  Service: Orthopedics;  Laterality: Right;  . RETINAL LASER PROCEDURE Bilateral    "for diabetic retinopathy"    There were no vitals filed for this visit.  Subjective Assessment - 07/03/17 0853    Subjective  Pt stating, "it's one of those days."  More fatigued today, spent yesterday outside working on a pressure washer, was sitting but was very focused on the task.    Patient is accompained by:  Family member mom in lobby    Pertinent  History  MS dx about 6 years ago    How long can you sit comfortably?  unlimited    How long can you stand comfortably?  unlimited    How long can you walk comfortably?  household distances due to LE fatigue/weakness    Diagnostic tests  X-ray    Patient Stated Goals  Walk without AD;  On 06/17/17 patient states "for me, coming off of that (RW) is how to measure improvement".     Currently in Pain?  No/denies       Reviewed all exercises below with and without resistance of red theraband:   ANKLE: Plantarflexion, Bilateral - Standing    Stand with upright posture. Raise heels up as high as possible. _12-15__ reps per set, _2__ sets per day.  Try 3-4 on one leg  FLEXION: Supine (Active)   Sitting back in a chair with right toes pointed downward and red band over the top of your feet. Bend ankle up, bringing toes toward head. Move straight up and down 12 times.  Move in a V shape 12 times. Complete __1_ sets of _12__ repetitions each foot. Perform __2_ sessions per day.  Hamstring Curl: Standing (Single Leg)    In shoulder width stance, anchor tubing under foot. Loop around other ankle with twist. Bend same knee, foot toward buttock. Repeat _5 times per leg with band on, 5 with band off.  Squat: Double Leg (Supported)    With feet shoulder width apart, hold support. Squat and step out the right all way down your counter top, repeat to the left - keeping legs in the squat.  Perform 2 trips without the band around your legs, 2 trips with the band.     SINGLE LIMB STANCE    Stance:STAND FACING A COUNTER-HOLD COUNTER WITH BOTH HANDS single leg on floor. Raise leg. Hold __10_ seconds. Repeat with other leg. _3_ reps per set, __2_ sets per day.  Tandem Walking    Holding counter top Walk with each foot directly in front of other, heel of one foot touching toes of other foot with each step. Both feet straight ahead. 2 trips.   Wall Squat    Feet shoulder width  apart, _12___ inches in front of wall, lean against wall. Heels on floor, knees parallel, bend hips and knees slightly. Hold _6-8___ seconds. Stand back up.  Can also have band around your hip, as you lower, press legs out into the band.  Repeat _4-5___ times. Do __1__ sessions per day.                     PT Education - 07/03/17 1128    Education provided  Yes    Education Details  updated HEP for more standing exercises with and without theraband    Person(s) Educated  Patient    Methods  Explanation;Handout;Demonstration    Comprehension  Verbalized understanding;Returned demonstration       PT Short Term Goals - 06/24/17 2107      PT SHORT TERM GOAL #1   Title  Pt wil participate in and receive appropriate AFO for home/community ambulation    Time  4    Period  Weeks    Status  Revised    Target Date  07/24/17      PT SHORT TERM GOAL #2   Title  Pt will ambulate 230' on level surfaces with cane and appropriate AFO with supervision    Time  4    Period  Weeks    Status  Revised    Target Date  07/24/17      PT SHORT TERM GOAL #5   Title  Pt will improve standing balance as indicated by increase in BERG balance score to > or = 50/56    Baseline  46/56    Time  4    Period  Weeks    Status  Revised    Target Date  07/24/17      PT SHORT TERM GOAL #6   Title  Pt will improve safety with gait as indicated by increase in gait velocity to >/= 2.67ft/sec with cane and AFO    Baseline  2.63ft/sec with cane    Time  4    Period  Weeks    Status  Revised    Target Date  07/24/17        PT Long Term Goals - 06/24/17 2114      PT LONG TERM GOAL #1   Title  Will be independent with HEP with standing HEP    Time  8    Period  Weeks    Status  New    Target Date  08/23/17      PT LONG TERM GOAL #2   Title  Pt will ambulate x 250' outside on pavement and up/down curb with cane, AFO and min A    Time  8    Period  Weeks    Status  New    Target Date   08/23/17      PT LONG TERM GOAL #5   Title  Pt will decrease falls risk as indicated by increase in BERG score to > or = 50/56    Baseline  46/56    Time  8    Period  Weeks    Status  Revised    Target Date  08/23/17      PT LONG TERM GOAL #7   Title  Pt will improve gait velocity to > or = 2.8 ft/sec with cane and appropriate AFO     Time  8    Period  Weeks    Status  Revised    Target Date  08/23/17            Plan - 07/03/17 1129    Clinical Impression Statement  Pt more fatigued today but willing to participate; focus of today's session on review of HEP, updating and progressing exercises to include more standing/WB, balance and resisted LE exercises to facilitate increased core activation and stability.  Pt tolerated well but required intermittent standing or sitting rest breaks.  Still unable to activate full ankle DF in standing and had to continue resisted DF in sitting.  Pt advised to break exercises in half, perform half in morning, half in evening.  Will continue to progress towards LTG.      Rehab Potential  Good    Clinical Impairments Affecting Rehab Potential  MS     PT Frequency  2x / week    PT Duration  8 weeks    PT Treatment/Interventions  ADLs/Self Care Home Management;Cryotherapy;Vasopneumatic Device;Taping;Gait training;Stair training;Functional mobility training;Therapeutic activities;Therapeutic exercise;Balance training;Neuromuscular re-education;Passive range of motion;Manual techniques;Patient/family education;Electrical Stimulation;DME Instruction;Orthotic Fit/Training;Energy conservation    PT Next Visit Plan  Tall kneeling therapy ball roll outs in various directions.  Continue balance training on rocker board; squat walking, wall squats, trunk control training in tall and half kneeling, gait with cane and AFO; Bioness?    Consulted and Agree with Plan of Care  Patient       Patient will benefit from skilled therapeutic intervention in order to  improve the following deficits and impairments:  Abnormal gait, Pain, Decreased range of motion, Decreased balance, Decreased mobility, Difficulty walking, Decreased activity tolerance, Decreased strength, Decreased endurance, Impaired flexibility, Impaired sensation  Visit Diagnosis: Muscle weakness (generalized)  Difficulty in walking, not elsewhere classified  Unsteadiness on feet  Stiffness of right ankle, not elsewhere classified     Problem List Patient Active Problem List   Diagnosis Date Noted  . Vitamin D insufficiency 11/29/2016  . Hypogonadism in male 11/01/2016  . Type 1 diabetes mellitus ([place])   . Hypothyroidism   . Hypertension   . Hyperlipidemia   . Closed fracture of right fibula and tibia 10/29/2016  . Closed tibia fracture 10/29/2016  . Closed fracture of right tibial plafond with fibula involvement 10/29/2016  . Abnormal liver function tests 03/06/2013  . RASH AND OTHER NONSPECIFIC SKIN ERUPTION 10/07/2009  . SHOULDER PAIN, LEFT 12/27/2008  . Type I (juvenile type) diabetes mellitus without mention of complication, uncontrolled 12/03/2007  . HYPOTHYROIDISM 11/12/2007  . HYPERCHOLESTEROLEMIA 11/12/2007  . MULTIPLE SCLEROSIS 08/29/2007  . PROLIFERATIVE DIABETIC RETINOPATHY 08/29/2007  . HYPERTENSION 12/25/2006    [name], PT, DPT 07/03/17    11:34 AM    [facility] [address]  [place], [place], [address] Phone: [phone]   Fax:  [phone]  Name: KEIDRICK MURTY MRN: [id number] Date of Birth: January 03, 1971

## 2017-07-03 NOTE — Patient Instructions (Signed)
Calf Stretch    With towel around forefoot, keep knee straight and pull back on towel until a stretch is felt in the calf. Hold __60__ seconds. Pull to the outside (to stretch the inside) hold 60 seconds.  Pull to the inside (to stretch the outside) hold 60 seconds.  Repeat __3__ times. Do __2__ sessions per day.   ANKLE: Plantarflexion, Bilateral - Standing    Stand with upright posture. Raise heels up as high as possible. _12-15__ reps per set, _2__ sets per day.  Try 3-4 on one leg  FLEXION: Supine (Active)   Sitting back in a chair with right toes pointed downward and red band over the top of your feet. Bend ankle up, bringing toes toward head. Move straight up and down 12 times.  Move in a V shape 12 times. Complete __1_ sets of _12__ repetitions each foot. Perform __2_ sessions per day.  Hamstring Curl: Standing (Single Leg)    In shoulder width stance, anchor tubing under foot. Loop around other ankle with twist. Bend same knee, foot toward buttock. Repeat _5 times per leg with band on, 5 with band off.    Squat: Double Leg (Supported)    With feet shoulder width apart, hold support. Squat and step out the right all way down your counter top, repeat to the left - keeping legs in the squat.  Perform 2 trips without the band around your legs, 2 trips with the band.     SINGLE LIMB STANCE    Stance:STAND FACING A COUNTER-HOLD COUNTER WITH BOTH HANDS single leg on floor. Raise leg. Hold __10_ seconds. Repeat with other leg. _3_ reps per set, __2_ sets per day.  Tandem Walking    Holding counter top Walk with each foot directly in front of other, heel of one foot touching toes of other foot with each step. Both feet straight ahead. 2 trips.   Wall Squat    Feet shoulder width apart, _12___ inches in front of wall, lean against wall. Heels on floor, knees parallel, bend hips and knees slightly. Hold _6-8___ seconds. Stand back up.  Can also have band around  your hip, as you lower, press legs out into the band.  Repeat _4-5___ times. Do __1__ sessions per day.   Bracing With Bridging (Hook-Lying)    Push through both heels and lift bottom.  Hold 2-3 seconds. Repeat _10__ times. Do _2__ times a day.   Bracing With March in Bridging (Hook-Lying)    Press through both heels. Lift bottom and hold, then march in place with each leg and then lower hips back down. March _10__ times. Do _2__ times a day.

## 2017-07-09 ENCOUNTER — Ambulatory Visit: Payer: Medicare Other | Admitting: Physical Therapy

## 2017-07-09 ENCOUNTER — Encounter: Payer: Self-pay | Admitting: Physical Therapy

## 2017-07-09 DIAGNOSIS — M6281 Muscle weakness (generalized): Secondary | ICD-10-CM | POA: Diagnosis not present

## 2017-07-09 DIAGNOSIS — R262 Difficulty in walking, not elsewhere classified: Secondary | ICD-10-CM

## 2017-07-09 DIAGNOSIS — R2681 Unsteadiness on feet: Secondary | ICD-10-CM

## 2017-07-09 DIAGNOSIS — M25671 Stiffness of right ankle, not elsewhere classified: Secondary | ICD-10-CM | POA: Diagnosis not present

## 2017-07-09 NOTE — Therapy (Signed)
[facility] [address] [place], [place], [address] Phone: [phone]   Fax:  [phone]  Physical Therapy Treatment  Patient Details  Name: Johnathan Ross MRN: [id number] Date of Birth: Jun 20, 1970 Referring Provider: Altamese Loma, MD   Encounter Date: 07/09/2017  PT End of Session - 07/09/17 0850    Visit Number  66    Number of Visits  52 per recertification    Date for PT Re-Evaluation  99/83/38 per recertification    Authorization Type  Medicare    PT Start Time  2505    PT Stop Time  0927    PT Time Calculation (min)  40 min    Activity Tolerance  Patient tolerated treatment well;Patient limited by fatigue    Behavior During Therapy  [facility] for tasks assessed/performed       Past Medical History:  Diagnosis Date  . Hyperlipidemia   . Hypertension   . Hypogonadism in male 11/01/2016  . Hypothyroidism   . Multiple sclerosis ([place])   . 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 Type 1 diabetes mellitus ([place]) dx'd 1994  . Vitamin D insufficiency 11/29/2016    Past Surgical History:  Procedure Laterality Date  . EYE SURGERY Bilateral    "laser for diabetic retinopathy"  . FRACTURE SURGERY    . OPEN REDUCTION INTERNAL FIXATION (ORIF) TIBIA/FIBULA FRACTURE Right 10/30/2016  . ORIF ANKLE FRACTURE Left 2015  . ORIF TIBIA FRACTURE Right 10/30/2016   Procedure: OPEN REDUCTION INTERNAL FIXATION (ORIF) TIBIA FIBULA FRACTURE;  Surgeon: Altamese [place]mwood, MD;  Location: [facility];  Service: Orthopedics;  Laterality: Right;  . RETINAL LASER PROCEDURE Bilateral    "for diabetic retinopathy"    There were no vitals filed for this visit.  Subjective Assessment - 07/09/17 0850    Subjective  No new complaints. States updated HEP is going well. No falls or pain to report.     Patient is accompained by:  Family member mom    Pertinent History  MS dx about 6 years ago    How long can you sit comfortably?  unlimited    How  long can you stand comfortably?  unlimited    How long can you walk comfortably?  household distances due to LE fatigue/weakness    Diagnostic tests  X-ray    Patient Stated Goals  Walk without AD;  On 06/17/17 patient states "for me, coming off of that (RW) is how to measure improvement".     Currently in Pain?  No/denies         [facility] Adult PT Treatment/Exercise - 07/09/17 3976      Neuro Re-ed    Neuro Re-ed Details   tall kneeling with red pball: rolling out fwd, right lateral, left lateral x 8 reps each one with min guard assist/cues on form and technique; in tall kneeling with no ball: green theraband pulls x 3 ways for 10 reps each; half kneeling alternating UE raises x 8-10 reps each with each foot forward, min to mod assist to maintain balance/position with cues/facitlitation provided.           Balance Exercises - 07/09/17 0907      Balance Exercises: Standing   SLS with Vectors  Solid surface;Limitations    Rockerboard  Anterior/posterior;Lateral;Head turns;EO;EC;30 seconds;10 reps      Balance Exercises: Standing   SLS with Vectors Limitations  3 foam bubbles in a semi-circle pattern: toe taps across x  reps each LE with min to  mod assist for balance    Rebounder Limitations  performed  both ways on balance  board with no UE support/intermittent touch to bars for balance: EO rocking board with emphasis on tall posture; holding board steady-EO alternating UE raises, progressing to bil UE raises; EC no head movements, progressing to EC head movements left<>right, up<>down. min to mod assist for balance with cues on posture and weight shifitng to assist with balance.           PT Short Term Goals - 06/24/17 2107      PT SHORT TERM GOAL #1   Title  Pt wil participate in and receive appropriate AFO for home/community ambulation    Time  4    Period  Weeks    Status  Revised    Target Date  07/24/17      PT SHORT TERM GOAL #2   Title  Pt will ambulate 230' on level  surfaces with cane and appropriate AFO with supervision    Time  4    Period  Weeks    Status  Revised    Target Date  07/24/17      PT SHORT TERM GOAL #5   Title  Pt will improve standing balance as indicated by increase in BERG balance score to > or = 50/56    Baseline  46/56    Time  4    Period  Weeks    Status  Revised    Target Date  07/24/17      PT SHORT TERM GOAL #6   Title  Pt will improve safety with gait as indicated by increase in gait velocity to >/= 2.25ft/sec with cane and AFO    Baseline  2.78ft/sec with cane    Time  4    Period  Weeks    Status  Revised    Target Date  07/24/17        PT Long Term Goals - 06/24/17 2114      PT LONG TERM GOAL #1   Title  Will be independent with HEP with standing HEP    Time  8    Period  Weeks    Status  New    Target Date  08/23/17      PT LONG TERM GOAL #2   Title  Pt will ambulate x 250' outside on pavement and up/down curb with cane, AFO and min A    Time  8    Period  Weeks    Status  New    Target Date  08/23/17      PT LONG TERM GOAL #5   Title  Pt will decrease falls risk as indicated by increase in BERG score to > or = 50/56    Baseline  46/56    Time  8    Period  Weeks    Status  Revised    Target Date  08/23/17      PT LONG TERM GOAL #7   Title  Pt will improve gait velocity to > or = 2.8 ft/sec with cane and appropriate AFO     Time  8    Period  Weeks    Status  Revised    Target Date  08/23/17            Plan - 07/09/17 0850    Clinical Impression Statement  Today's skilled session continued to focus on core/LE strengthening and balance reactions. Pt reported no more fatigue than  usual after session. Pt is progressing toward goals and should benefit from continued PT to progress toward unmet goals.     Rehab Potential  Good    Clinical Impairments Affecting Rehab Potential  MS     PT Frequency  2x / week    PT Duration  8 weeks    PT Treatment/Interventions  ADLs/Self Care Home  Management;Cryotherapy;Vasopneumatic Device;Taping;Gait training;Stair training;Functional mobility training;Therapeutic activities;Therapeutic exercise;Balance training;Neuromuscular re-education;Passive range of motion;Manual techniques;Patient/family education;Electrical Stimulation;DME Instruction;Orthotic Fit/Training;Energy conservation    PT Next Visit Plan  continue with tall kneeling therapy ball roll outs in various directions, other tasks for core strengthening; Continue balance training on rocker board; squat walking, wall squats, trunk control training in tall and half kneeling, gait with cane and AFO; Bioness?    Consulted and Agree with Plan of Care  Patient       Patient will benefit from skilled therapeutic intervention in order to improve the following deficits and impairments:  Abnormal gait, Pain, Decreased range of motion, Decreased balance, Decreased mobility, Difficulty walking, Decreased activity tolerance, Decreased strength, Decreased endurance, Impaired flexibility, Impaired sensation  Visit Diagnosis: Muscle weakness (generalized)  Difficulty in walking, not elsewhere classified  Unsteadiness on feet     Problem List Patient Active Problem List   Diagnosis Date Noted  . Vitamin D insufficiency 11/29/2016  . Hypogonadism in male 11/01/2016  . Type 1 diabetes mellitus ([place])   . Hypothyroidism   . Hypertension   . Hyperlipidemia   . Closed fracture of right fibula and tibia 10/29/2016  . Closed tibia fracture 10/29/2016  . Closed fracture of right tibial plafond with fibula involvement 10/29/2016  . Abnormal liver function tests 03/06/2013  . RASH AND OTHER NONSPECIFIC SKIN ERUPTION 10/07/2009  . SHOULDER PAIN, LEFT 12/27/2008  . Type I (juvenile type) diabetes mellitus without mention of complication, uncontrolled 12/03/2007  . HYPOTHYROIDISM 11/12/2007  . HYPERCHOLESTEROLEMIA 11/12/2007  . MULTIPLE SCLEROSIS 08/29/2007  . PROLIFERATIVE DIABETIC  RETINOPATHY 08/29/2007  . HYPERTENSION 12/25/2006    Willow Ora, PTA, [facility] [address] [place], [place] [address] [phone] 07/09/17, 9:31 AM   Name: Johnathan Ross MRN: [id number] Date of Birth: 1970-07-17

## 2017-07-11 ENCOUNTER — Encounter: Payer: Self-pay | Admitting: Physical Therapy

## 2017-07-11 ENCOUNTER — Ambulatory Visit: Payer: Medicare Other | Admitting: Physical Therapy

## 2017-07-11 DIAGNOSIS — M6281 Muscle weakness (generalized): Secondary | ICD-10-CM | POA: Diagnosis not present

## 2017-07-11 DIAGNOSIS — R262 Difficulty in walking, not elsewhere classified: Secondary | ICD-10-CM | POA: Diagnosis not present

## 2017-07-11 DIAGNOSIS — R2681 Unsteadiness on feet: Secondary | ICD-10-CM | POA: Diagnosis not present

## 2017-07-11 DIAGNOSIS — M25671 Stiffness of right ankle, not elsewhere classified: Secondary | ICD-10-CM | POA: Diagnosis not present

## 2017-07-11 NOTE — Therapy (Signed)
[facility] [address] [place], [place], [address] Phone: [phone]   Fax:  [phone]  Physical Therapy Treatment  Patient Details  Name: Johnathan Ross MRN: [id number] Date of Birth: 24-Oct-1970 Referring Provider: Altamese [place], MD   Encounter Date: 07/11/2017  PT End of Session - 07/11/17 0926    Visit Number  67    Number of Visits  4 per recertification    Date for PT Re-Evaluation  33/29/51 per recertification    Authorization Type  Medicare    PT Start Time  0845    PT Stop Time  0923    PT Time Calculation (min)  38 min    Activity Tolerance  Patient tolerated treatment well;Patient limited by fatigue    Behavior During Therapy  [facility] for tasks assessed/performed       Past Medical History:  Diagnosis Date  . Hyperlipidemia   . Hypertension   . Hypogonadism in male 11/01/2016  . Hypothyroidism   . Multiple sclerosis ([place])   . 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 Type 1 diabetes mellitus ([place]) dx'd 1994  . Vitamin D insufficiency 11/29/2016    Past Surgical History:  Procedure Laterality Date  . EYE SURGERY Bilateral    "laser for diabetic retinopathy"  . FRACTURE SURGERY    . OPEN REDUCTION INTERNAL FIXATION (ORIF) TIBIA/FIBULA FRACTURE Right 10/30/2016  . ORIF ANKLE FRACTURE Left 2015  . ORIF TIBIA FRACTURE Right 10/30/2016   Procedure: OPEN REDUCTION INTERNAL FIXATION (ORIF) TIBIA FIBULA FRACTURE;  Surgeon: Altamese [place], MD;  Location: [facility];  Service: Orthopedics;  Laterality: Right;  . RETINAL LASER PROCEDURE Bilateral    "for diabetic retinopathy"    There were no vitals filed for this visit.  Subjective Assessment - 07/11/17 0847    Subjective  nothing new to report.    Patient is accompained by:  Family member mom    Pertinent History  MS dx about 6 years ago    How long can you sit comfortably?  unlimited    How long can you stand comfortably?  unlimited    How long can  you walk comfortably?  household distances due to LE fatigue/weakness    Diagnostic tests  X-ray    Patient Stated Goals  Walk without AD;  On 06/17/17 patient states "for me, coming off of that (RW) is how to measure improvement".                       [facility] Adult PT Treatment/Exercise - 07/11/17 0001      Ambulation/Gait   Ambulation/Gait  Yes    Ambulation/Gait Assistance  3: Mod assist    Ambulation/Gait Assistance Details  Gait mid session: Pts LEs were fatigued. Loss of balance x1 requiring mod A; multiple stops for controlled movement    Ambulation Distance (Feet)  115 Feet    Assistive device  Straight cane tri tip    Gait Pattern  Step-through pattern;Decreased dorsiflexion - right;Right genu recurvatum;Left genu recurvatum;Poor foot clearance - right    Ambulation Surface  Level;Indoor    Stairs  Yes    Stairs Assistance  5: Supervision    Stairs Assistance Details (indicate cue type and reason)  cues for technique with cane and alternating pattern    Stair Management Technique  One rail Right;Alternating pattern;With cane      Exercises   Other Exercises   lumbar      Lumbar Exercises: Quadruped  Straight Leg Raise  10 reps;3 seconds    Other Quadruped Lumbar Exercises  Tall kneel: transitioning from quadruped to tall knee holding weighted  ball and performing chest press when at tall kneel                              Other Quadruped Lumbar Exercises  Tall kneel: rolling physioball out and back in.      Knee/Hip Exercises: Standing   Wall Squat  2 sets;10 reps;3 seconds With therapy ball. Pt had trouble coming back up if squat was to deep. Needed A +2 x1 due to squat being to deep.     Knee/Hip Exercises: Seated   Sit to Sand  2 sets;10 reps;without UE support               PT Short Term Goals - 06/24/17 2107      PT SHORT TERM GOAL #1   Title  Pt wil participate in and receive appropriate AFO for home/community ambulation    Time  4     Period  Weeks    Status  Revised    Target Date  07/24/17      PT SHORT TERM GOAL #2   Title  Pt will ambulate 230' on level surfaces with cane and appropriate AFO with supervision    Time  4    Period  Weeks    Status  Revised    Target Date  07/24/17      PT SHORT TERM GOAL #5   Title  Pt will improve standing balance as indicated by increase in BERG balance score to > or = 50/56    Baseline  46/56    Time  4    Period  Weeks    Status  Revised    Target Date  07/24/17      PT SHORT TERM GOAL #6   Title  Pt will improve safety with gait as indicated by increase in gait velocity to >/= 2.45ft/sec with cane and AFO    Baseline  2.20ft/sec with cane    Time  4    Period  Weeks    Status  Revised    Target Date  07/24/17        PT Long Term Goals - 06/24/17 2114      PT LONG TERM GOAL #1   Title  Will be independent with HEP with standing HEP    Time  8    Period  Weeks    Status  New    Target Date  08/23/17      PT LONG TERM GOAL #2   Title  Pt will ambulate x 250' outside on pavement and up/down curb with cane, AFO and min A    Time  8    Period  Weeks    Status  New    Target Date  08/23/17      PT LONG TERM GOAL #5   Title  Pt will decrease falls risk as indicated by increase in BERG score to > or = 50/56    Baseline  46/56    Time  8    Period  Weeks    Status  Revised    Target Date  08/23/17      PT LONG TERM GOAL #7   Title  Pt will improve gait velocity to > or = 2.8 ft/sec with cane and appropriate AFO  Time  8    Period  Weeks    Status  Revised    Target Date  08/23/17            Plan - 07/11/17 1232    Clinical Impression Statement  Pt able to perform stair negotiation at supervision level with alternating pattern, 1 rail and SPC.  Pt required ModA to maintain balance during gait with SCP and stopped multiple times to gain balance.  Performed core and LE stengthening/stability acitvites in quadruped and tall kneel; pt was able to  transitions between each position with minimal UE support holding weighted ball at supervision level; pt correcting imbalances.                                                               Rehab Potential  Good    Clinical Impairments Affecting Rehab Potential  MS     PT Frequency  2x / week    PT Duration  8 weeks    PT Treatment/Interventions  ADLs/Self Care Home Management;Cryotherapy;Vasopneumatic Device;Taping;Gait training;Stair training;Functional mobility training;Therapeutic activities;Therapeutic exercise;Balance training;Neuromuscular re-education;Passive range of motion;Manual techniques;Patient/family education;Electrical Stimulation;DME Instruction;Orthotic Fit/Training;Energy conservation    PT Next Visit Plan  continue with tall kneeling therapy ball roll outs in various directions, other tasks for core strengthening; Continue balance training on rocker board; squat walking, wall squats, trunk control training in tall and half kneeling, gait with cane and AFO; Bioness?    Consulted and Agree with Plan of Care  Patient       Patient will benefit from skilled therapeutic intervention in order to improve the following deficits and impairments:  Abnormal gait, Pain, Decreased range of motion, Decreased balance, Decreased mobility, Difficulty walking, Decreased activity tolerance, Decreased strength, Decreased endurance, Impaired flexibility, Impaired sensation  Visit Diagnosis: Difficulty in walking, not elsewhere classified  Unsteadiness on feet  Muscle weakness (generalized)     Problem List Patient Active Problem List   Diagnosis Date Noted  . Vitamin D insufficiency 11/29/2016  . Hypogonadism in male 11/01/2016  . Type 1 diabetes mellitus ([place])   . Hypothyroidism   . Hypertension   . Hyperlipidemia   . Closed fracture of right fibula and tibia 10/29/2016  . Closed tibia fracture 10/29/2016  . Closed fracture of right tibial plafond with fibula involvement 10/29/2016   . Abnormal liver function tests 03/06/2013  . RASH AND OTHER NONSPECIFIC SKIN ERUPTION 10/07/2009  . SHOULDER PAIN, LEFT 12/27/2008  . Type I (juvenile type) diabetes mellitus without mention of complication, uncontrolled 12/03/2007  . HYPOTHYROIDISM 11/12/2007  . HYPERCHOLESTEROLEMIA 11/12/2007  . MULTIPLE SCLEROSIS 08/29/2007  . PROLIFERATIVE DIABETIC RETINOPATHY 08/29/2007  . HYPERTENSION 12/25/2006    Bjorn Loser, PTA  07/11/17, 12:39 PM [facility] [address] [place], [place], [address] Phone: [phone]   Fax:  [phone]  Name: Johnathan Ross MRN: [id number] Date of Birth: [birth year]

## 2017-07-15 ENCOUNTER — Encounter: Payer: Self-pay | Admitting: Physical Therapy

## 2017-07-15 ENCOUNTER — Ambulatory Visit: Payer: Medicare Other | Admitting: Physical Therapy

## 2017-07-15 DIAGNOSIS — M25671 Stiffness of right ankle, not elsewhere classified: Secondary | ICD-10-CM | POA: Diagnosis not present

## 2017-07-15 DIAGNOSIS — M6281 Muscle weakness (generalized): Secondary | ICD-10-CM | POA: Diagnosis not present

## 2017-07-15 DIAGNOSIS — R2681 Unsteadiness on feet: Secondary | ICD-10-CM | POA: Diagnosis not present

## 2017-07-15 DIAGNOSIS — R262 Difficulty in walking, not elsewhere classified: Secondary | ICD-10-CM

## 2017-07-15 NOTE — Therapy (Signed)
[facility] [address] [place], [place], [address] Phone: [phone]   Fax:  [phone]  Physical Therapy Treatment  Patient Details  Name: Johnathan Ross MRN: [id number] Date of Birth: [birth year] Referring Provider: Altamese San Patricio, MD   Encounter Date: 07/15/2017  PT End of Session - 07/15/17 0850    Visit Number  48    Number of Visits  51 per recertification    Date for PT Re-Evaluation  26/94/85 per recertification    Authorization Type  Medicare    PT Start Time  4627    PT Stop Time  0925    PT Time Calculation (min)  38 min    Activity Tolerance  Patient tolerated treatment well;Patient limited by fatigue    Behavior During Therapy  [facility] for tasks assessed/performed       Past Medical History:  Diagnosis Date  . Hyperlipidemia   . Hypertension   . Hypogonadism in male 11/01/2016  . Hypothyroidism   . Multiple sclerosis ([place])   . 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 Type 1 diabetes mellitus ([place]) dx'd 1994  . Vitamin D insufficiency 11/29/2016    Past Surgical History:  Procedure Laterality Date  . EYE SURGERY Bilateral    "laser for diabetic retinopathy"  . FRACTURE SURGERY    . OPEN REDUCTION INTERNAL FIXATION (ORIF) TIBIA/FIBULA FRACTURE Right 10/30/2016  . ORIF ANKLE FRACTURE Left 2015  . ORIF TIBIA FRACTURE Right 10/30/2016   Procedure: OPEN REDUCTION INTERNAL FIXATION (ORIF) TIBIA FIBULA FRACTURE;  Surgeon: Altamese Penasco, MD;  Location: [facility];  Service: Orthopedics;  Laterality: Right;  . RETINAL LASER PROCEDURE Bilateral    "for diabetic retinopathy"    There were no vitals filed for this visit.  Subjective Assessment - 07/15/17 0849    Subjective  No new complaints. No falls or pain to report.     Patient is accompained by:  Family member    Pertinent History  MS dx about 6 years ago    How long can you sit comfortably?  unlimited    How long can you stand comfortably?   unlimited    How long can you walk comfortably?  household distances due to LE fatigue/weakness    Diagnostic tests  X-ray    Patient Stated Goals  Walk without AD;  On 06/17/17 patient states "for me, coming off of that (RW) is how to measure improvement".     Currently in Pain?  No/denies          [facility] Adult PT Treatment/Exercise - 07/15/17 0851      Transfers   Transfers  Sit to Stand;Stand to Sit    Sit to Stand  5: Supervision;With upper extremity assist    Stand to Sit  5: Supervision;With upper extremity assist      Ambulation/Gait   Ambulation/Gait  Yes    Ambulation/Gait Assistance  4: Min assist;3: Mod assist    Ambulation/Gait Assistance Details  worked on gait with cane at start of session today with moslty min guard/min assist for balance. one episode of anterior balance loss needing mod assist to correct due to right toe scuffing. cues needed on posture, step length and cane placement.     Ambulation Distance (Feet)  115 Feet    Assistive device  Straight cane with rubber quad tip    Gait Pattern  Step-through pattern;Decreased dorsiflexion - right;Right genu recurvatum;Left genu recurvatum;Poor foot clearance - right    Ambulation Surface  Level;Indoor  Stairs  Yes    Stairs Assistance  5: Supervision    Stairs Assistance Details (indicate cue type and reason)  cues for weight shifting and sequencing with cane,     Stair Management Technique  One rail Right;Alternating pattern;With cane    Number of Stairs  4 x2 reps    Height of Stairs  6      Neuro Re-ed    Neuro Re-ed Details   for strengthening/balance/coordination: in tall kneeling- red pball roll outs fwd x10, then alternating lateral rolls x 10 reps each way; with ball support lateral walking, then fwd/bwd walking with emphasis on staying in tall kneeling with upright posture; tall kneeling with green theraband 3 way pulls x 10 reps each with cues for posture; quadruped- altenating leg outs x 10 reps, then  with red ball support to abdomen contralateral UE/leg raises 2 sets of 5 reps; working on half kneeling with improvement in form when provided the use of chair for UE support, cues/facilitation to pelvis/trunk for stability.                  PT Short Term Goals - 06/24/17 2107      PT SHORT TERM GOAL #1   Title  Pt wil participate in and receive appropriate AFO for home/community ambulation    Time  4    Period  Weeks    Status  Revised    Target Date  07/24/17      PT SHORT TERM GOAL #2   Title  Pt will ambulate 230' on level surfaces with cane and appropriate AFO with supervision    Time  4    Period  Weeks    Status  Revised    Target Date  07/24/17      PT SHORT TERM GOAL #5   Title  Pt will improve standing balance as indicated by increase in BERG balance score to > or = 50/56    Baseline  46/56    Time  4    Period  Weeks    Status  Revised    Target Date  07/24/17      PT SHORT TERM GOAL #6   Title  Pt will improve safety with gait as indicated by increase in gait velocity to >/= 2.22ft/sec with cane and AFO    Baseline  2.21ft/sec with cane    Time  4    Period  Weeks    Status  Revised    Target Date  07/24/17        PT Long Term Goals - 06/24/17 2114      PT LONG TERM GOAL #1   Title  Will be independent with HEP with standing HEP    Time  8    Period  Weeks    Status  New    Target Date  08/23/17      PT LONG TERM GOAL #2   Title  Pt will ambulate x 250' outside on pavement and up/down curb with cane, AFO and min A    Time  8    Period  Weeks    Status  New    Target Date  08/23/17      PT LONG TERM GOAL #5   Title  Pt will decrease falls risk as indicated by increase in BERG score to > or = 50/56    Baseline  46/56    Time  8    Period  Weeks  Status  Revised    Target Date  08/23/17      PT LONG TERM GOAL #7   Title  Pt will improve gait velocity to > or = 2.8 ft/sec with cane and appropriate AFO     Time  8    Period  Weeks    Status   Revised    Target Date  08/23/17            Plan - 07/15/17 0850    Clinical Impression Statement  Today's skilled session continued to focus on gait with cane and mm strengthening/NMR with only fatigue reported. Pt is progressing toward goals and should benefit from continued PT to progress toward unmet goals .    Rehab Potential  Good    Clinical Impairments Affecting Rehab Potential  MS     PT Frequency  2x / week    PT Duration  8 weeks    PT Treatment/Interventions  ADLs/Self Care Home Management;Cryotherapy;Vasopneumatic Device;Taping;Gait training;Stair training;Functional mobility training;Therapeutic activities;Therapeutic exercise;Balance training;Neuromuscular re-education;Passive range of motion;Manual techniques;Patient/family education;Electrical Stimulation;DME Instruction;Orthotic Fit/Training;Energy conservation    PT Next Visit Plan  continue with tall kneeling therapy ball roll outs in various directions, other tasks for core strengthening; Continue balance training on rocker board; squat walking, wall squats, trunk control training in tall and half kneeling, gait with cane and AFO; Bioness?    Consulted and Agree with Plan of Care  Patient       Patient will benefit from skilled therapeutic intervention in order to improve the following deficits and impairments:  Abnormal gait, Pain, Decreased range of motion, Decreased balance, Decreased mobility, Difficulty walking, Decreased activity tolerance, Decreased strength, Decreased endurance, Impaired flexibility, Impaired sensation  Visit Diagnosis: Difficulty in walking, not elsewhere classified  Unsteadiness on feet  Muscle weakness (generalized)     Problem List Patient Active Problem List   Diagnosis Date Noted  . Vitamin D insufficiency 11/29/2016  . Hypogonadism in male 11/01/2016  . Type 1 diabetes mellitus ([place])   . Hypothyroidism   . Hypertension   . Hyperlipidemia   . Closed fracture of right  fibula and tibia 10/29/2016  . Closed tibia fracture 10/29/2016  . Closed fracture of right tibial plafond with fibula involvement 10/29/2016  . Abnormal liver function tests 03/06/2013  . RASH AND OTHER NONSPECIFIC SKIN ERUPTION 10/07/2009  . SHOULDER PAIN, LEFT 12/27/2008  . Type I (juvenile type) diabetes mellitus without mention of complication, uncontrolled 12/03/2007  . HYPOTHYROIDISM 11/12/2007  . HYPERCHOLESTEROLEMIA 11/12/2007  . MULTIPLE SCLEROSIS 08/29/2007  . PROLIFERATIVE DIABETIC RETINOPATHY 08/29/2007  . HYPERTENSION 12/25/2006    Willow Ora, PTA, [facility] [address], [place], [place] [address] [phone] 07/15/17, 7:44 PM   Name: Johnathan Ross MRN: [id number] Date of Birth: 03-26-1971

## 2017-07-17 ENCOUNTER — Encounter: Payer: Self-pay | Admitting: Physical Therapy

## 2017-07-17 ENCOUNTER — Ambulatory Visit: Payer: Medicare Other | Admitting: Physical Therapy

## 2017-07-17 DIAGNOSIS — M6281 Muscle weakness (generalized): Secondary | ICD-10-CM

## 2017-07-17 DIAGNOSIS — R2681 Unsteadiness on feet: Secondary | ICD-10-CM

## 2017-07-17 DIAGNOSIS — R262 Difficulty in walking, not elsewhere classified: Secondary | ICD-10-CM | POA: Diagnosis not present

## 2017-07-17 DIAGNOSIS — M25671 Stiffness of right ankle, not elsewhere classified: Secondary | ICD-10-CM | POA: Diagnosis not present

## 2017-07-17 NOTE — Therapy (Signed)
[facility] [address] [place], [place], [address] Phone: [phone]   Fax:  [phone]  Physical Therapy Treatment  Patient Details  Name: Johnathan Ross MRN: [id number] Date of Birth: [birth year] Referring Provider: Altamese Morenci, MD   Encounter Date: 07/17/2017  PT End of Session - 07/17/17 2054    Visit Number  44    Number of Visits  3 per recertification    Date for PT Re-Evaluation  26/83/41 per recertification    Authorization Type  Medicare    PT Start Time  9622    PT Stop Time  0937    PT Time Calculation (min)  42 min    Equipment Utilized During Treatment  Gait belt    Activity Tolerance  Patient tolerated treatment well;Patient limited by fatigue    Behavior During Therapy  [facility] for tasks assessed/performed       Past Medical History:  Diagnosis Date  . Hyperlipidemia   . Hypertension   . Hypogonadism in male 11/01/2016  . Hypothyroidism   . Multiple sclerosis ([place])   . 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 Type 1 diabetes mellitus ([place]) dx'd 1994  . Vitamin D insufficiency 11/29/2016    Past Surgical History:  Procedure Laterality Date  . EYE SURGERY Bilateral    "laser for diabetic retinopathy"  . FRACTURE SURGERY    . OPEN REDUCTION INTERNAL FIXATION (ORIF) TIBIA/FIBULA FRACTURE Right 10/30/2016  . ORIF ANKLE FRACTURE Left 2015  . ORIF TIBIA FRACTURE Right 10/30/2016   Procedure: OPEN REDUCTION INTERNAL FIXATION (ORIF) TIBIA FIBULA FRACTURE;  Surgeon: Altamese Prescott, MD;  Location: [facility];  Service: Orthopedics;  Laterality: Right;  . RETINAL LASER PROCEDURE Bilateral    "for diabetic retinopathy"    There were no vitals filed for this visit.  Subjective Assessment - 07/17/17 0903    Subjective  No new complaints. No falls or pain to report.     Patient is accompained by:  Family member    Pertinent History  MS dx about 6 years ago    How long can you sit comfortably?   unlimited    How long can you stand comfortably?  unlimited    How long can you walk comfortably?  household distances due to LE fatigue/weakness    Diagnostic tests  X-ray    Patient Stated Goals  Walk without AD;  On 06/17/17 patient states "for me, coming off of that (RW) is how to measure improvement".     Currently in Pain?  No/denies           [facility] Adult PT Treatment/Exercise - 07/17/17 0904      Transfers   Transfers  Sit to Stand;Stand to Sit    Sit to Stand  5: Supervision;With upper extremity assist    Stand to Sit  5: Supervision;With upper extremity assist      Ambulation/Gait   Ambulation/Gait  Yes    Ambulation/Gait Assistance  4: Min assist    Ambulation/Gait Assistance Details  3 standing rest breaks taken with gait due to fatigue. no significant balance loss today with lap around track. pt did have an anterior loss of balance while walking from stairs to parallel bars needing increased assist, min<>mod, to correct balance.     Ambulation Distance (Feet)  115 Feet    Assistive device  Straight cane    Stairs  Yes    Stairs Assistance  5: Supervision    Stairs Assistance Details (indicate cue  type and reason)  cues on sequencing, hip/knee flexion to assist with clearing stair edges    Stair Management Technique  One rail Right;Alternating pattern;With cane    Number of Stairs  4 x2    Height of Stairs  6      High Level Balance   High Level Balance Activities  Side stepping;Backward walking;Tandem walking;Marching forwards    High Level Balance Comments  in parallel bars with little to no UE support, min guard to min assist for 3-4 laps each with cues on posture, wider base of support and ex form.       Neuro Re-ed    Neuro Re-ed Details   for strengthening/coordination: on red mat on floor in tall kneeling- mini squats x 10 reps with simultaneous OH press with 3# ball; lateral stepping left<>right along length of mat x 3 laps each way, then fwd/bwd stepping along  length of mat for 3 laps each way. min guard to min assist with occasional touch to mat table for balance, cues on posture and weight shifting to assist with balance; in tall kneeling with 3# ball- upper trunk rotation left<>right x 10 reps each way with min guard assist and cues on posture.       Exercises   Other Exercises   right foot resisted eversion and inversion with red theraband x 7-8 reps each with cues on correct form/technique. added to HEP to address ankle instability in stance/weight bearing.           Balance Exercises - 07/17/17 2051      Balance Exercises: Standing   Standing Eyes Closed  Wide (BOA);Foam/compliant surface;Limitations;20 secs      Balance Exercises: Standing   Standing Eyes Closed Limitations  on dense blue foam in open position (not doubled): feet hip width apart for EC no head movements x 3 reps. min guard to min assist for balance with assistance needed for right ankle stability due to eversion/outward rolling/lateral instability         PT Education - 07/17/17 2058    Education provided  Yes    Education Details  added red band resisted right ankle eversion/inversion to HEP    Person(s) Educated  Patient    Methods  Explanation;Demonstration;Verbal cues    Comprehension  Verbalized understanding;Returned demonstration;Verbal cues required       PT Short Term Goals - 06/24/17 2107      PT SHORT TERM GOAL #1   Title  Pt wil participate in and receive appropriate AFO for home/community ambulation    Time  4    Period  Weeks    Status  Revised    Target Date  07/24/17      PT SHORT TERM GOAL #2   Title  Pt will ambulate 230' on level surfaces with cane and appropriate AFO with supervision    Time  4    Period  Weeks    Status  Revised    Target Date  07/24/17      PT SHORT TERM GOAL #5   Title  Pt will improve standing balance as indicated by increase in BERG balance score to > or = 50/56    Baseline  46/56    Time  4    Period  Weeks     Status  Revised    Target Date  07/24/17      PT SHORT TERM GOAL #6   Title  Pt will improve safety with gait as indicated  by increase in gait velocity to >/= 2.80ft/sec with cane and AFO    Baseline  2.40ft/sec with cane    Time  4    Period  Weeks    Status  Revised    Target Date  07/24/17        PT Long Term Goals - 06/24/17 2114      PT LONG TERM GOAL #1   Title  Will be independent with HEP with standing HEP    Time  8    Period  Weeks    Status  New    Target Date  08/23/17      PT LONG TERM GOAL #2   Title  Pt will ambulate x 250' outside on pavement and up/down curb with cane, AFO and min A    Time  8    Period  Weeks    Status  New    Target Date  08/23/17      PT LONG TERM GOAL #5   Title  Pt will decrease falls risk as indicated by increase in BERG score to > or = 50/56    Baseline  46/56    Time  8    Period  Weeks    Status  Revised    Target Date  08/23/17      PT LONG TERM GOAL #7   Title  Pt will improve gait velocity to > or = 2.8 ft/sec with cane and appropriate AFO     Time  8    Period  Weeks    Status  Revised    Target Date  08/23/17            Plan - 07/17/17 2055    Clinical Impression Statement  Today's skilled session returned to use of brace on right LE with gait/stairs/balance activities with minor improvement noted in foot clearance/knee control. Pt is agreeable to an orthotic consult to address potential brace need. Remainder of session continued to address LE/core strengthening/NMR without any issues reported. Also added theraband resistance eversion and inversion to HEP today to continue to address ankle weakness. Did not issues printed instruction as pt stated he would remember. Pt is progressing and should benefit from continued PT to progress toward unmet goals.     Rehab Potential  Good    Clinical Impairments Affecting Rehab Potential  MS     PT Frequency  2x / week    PT Duration  8 weeks    PT Treatment/Interventions   ADLs/Self Care Home Management;Cryotherapy;Vasopneumatic Device;Taping;Gait training;Stair training;Functional mobility training;Therapeutic activities;Therapeutic exercise;Balance training;Neuromuscular re-education;Passive range of motion;Manual techniques;Patient/family education;Electrical Stimulation;DME Instruction;Orthotic Fit/Training;Energy conservation    PT Next Visit Plan  continue with tall kneeling therapy ball roll outs in various directions, other tasks for core strengthening; Continue balance training on rocker board; squat walking, wall squats, trunk control training in tall and half kneeling, gait with cane and AFO; Bioness?    Consulted and Agree with Plan of Care  Patient       Patient will benefit from skilled therapeutic intervention in order to improve the following deficits and impairments:  Abnormal gait, Pain, Decreased range of motion, Decreased balance, Decreased mobility, Difficulty walking, Decreased activity tolerance, Decreased strength, Decreased endurance, Impaired flexibility, Impaired sensation  Visit Diagnosis: Difficulty in walking, not elsewhere classified  Unsteadiness on feet  Muscle weakness (generalized)     Problem List Patient Active Problem List   Diagnosis Date Noted  . Vitamin D insufficiency 11/29/2016  .  Hypogonadism in male 11/01/2016  . Type 1 diabetes mellitus ([place])   . Hypothyroidism   . Hypertension   . Hyperlipidemia   . Closed fracture of right fibula and tibia 10/29/2016  . Closed tibia fracture 10/29/2016  . Closed fracture of right tibial plafond with fibula involvement 10/29/2016  . Abnormal liver function tests 03/06/2013  . RASH AND OTHER NONSPECIFIC SKIN ERUPTION 10/07/2009  . SHOULDER PAIN, LEFT 12/27/2008  . Type I (juvenile type) diabetes mellitus without mention of complication, uncontrolled 12/03/2007  . HYPOTHYROIDISM 11/12/2007  . HYPERCHOLESTEROLEMIA 11/12/2007  . MULTIPLE SCLEROSIS 08/29/2007  .  PROLIFERATIVE DIABETIC RETINOPATHY 08/29/2007  . HYPERTENSION 12/25/2006    Willow Ora, PTA, [facility] [address] [place], [place] [address] [phone] 07/17/17, 9:00 PM   Name: Johnathan Ross MRN: [id number] Date of Birth: July 15, 1970

## 2017-07-22 ENCOUNTER — Encounter: Payer: Self-pay | Admitting: Physical Therapy

## 2017-07-22 ENCOUNTER — Ambulatory Visit: Payer: Medicare Other | Admitting: Physical Therapy

## 2017-07-22 DIAGNOSIS — R262 Difficulty in walking, not elsewhere classified: Secondary | ICD-10-CM

## 2017-07-22 DIAGNOSIS — R2681 Unsteadiness on feet: Secondary | ICD-10-CM

## 2017-07-22 DIAGNOSIS — M25671 Stiffness of right ankle, not elsewhere classified: Secondary | ICD-10-CM | POA: Diagnosis not present

## 2017-07-22 DIAGNOSIS — M6281 Muscle weakness (generalized): Secondary | ICD-10-CM | POA: Diagnosis not present

## 2017-07-22 NOTE — Therapy (Signed)
[facility] [address] [place], [place], [address] Phone: [phone]   Fax:  [phone]  Physical Therapy Treatment  Patient Details  Name: Johnathan Ross MRN: [id number] Date of Birth: 11-Jun-1970 Referring Provider: Altamese [place], MD   Encounter Date: 07/22/2017  PT End of Session - 07/22/17 1100    Visit Number  50    Number of Visits  33    Date for PT Re-Evaluation  08/23/17    Authorization Type  Medicare    PT Start Time  0930    PT Stop Time  1017    PT Time Calculation (min)  47 min    Equipment Utilized During Treatment  Gait belt    Activity Tolerance  Patient tolerated treatment well;Patient limited by fatigue    Behavior During Therapy  [facility] for tasks assessed/performed       Past Medical History:  Diagnosis Date  . Hyperlipidemia   . Hypertension   . Hypogonadism in male 11/01/2016  . Hypothyroidism   . Multiple sclerosis ([place])   . 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 Type 1 diabetes mellitus ([place]) dx'd 1994  . Vitamin D insufficiency 11/29/2016    Past Surgical History:  Procedure Laterality Date  . EYE SURGERY Bilateral    "laser for diabetic retinopathy"  . FRACTURE SURGERY    . OPEN REDUCTION INTERNAL FIXATION (ORIF) TIBIA/FIBULA FRACTURE Right 10/30/2016  . ORIF ANKLE FRACTURE Left 2015  . ORIF TIBIA FRACTURE Right 10/30/2016   Procedure: OPEN REDUCTION INTERNAL FIXATION (ORIF) TIBIA FIBULA FRACTURE;  Surgeon: Altamese [place], MD;  Location: [facility];  Service: Orthopedics;  Laterality: Right;  . RETINAL LASER PROCEDURE Bilateral    "for diabetic retinopathy"    There were no vitals filed for this visit.  Subjective Assessment - 07/22/17 1027    Subjective  Pt reports no pain, no fall, and no changes in medications.     Pertinent History  MS dx about 6 years ago    How long can you sit comfortably?  unlimited    How long can you stand comfortably?  unlimited    How long can  you walk comfortably?  household distances due to LE fatigue/weakness    Diagnostic tests  X-ray    Patient Stated Goals  Walk without AD;  On 06/17/17 patient states "for me, coming off of that (RW) is how to measure improvement".     Currently in Pain?  No/denies    Multiple Pain Sites  No           [facility] Adult PT Treatment/Exercise - 07/22/17 1028      Transfers   Transfers  Sit to Stand;Stand to Sit    Sit to Stand  5: Supervision;With upper extremity assist    Stand to Sit  5: Supervision;With upper extremity assist      Ambulation/Gait   Ambulation/Gait  Yes    Ambulation/Gait Assistance  4: Min assist    Ambulation/Gait Assistance Details  3 standing rest breaks due to fatigue.     Ambulation Distance (Feet)  75 Feet 75 feet x 2 entering and exiting PT clinic.    Assistive device  Rolling walker    Gait Pattern  Step-through pattern;Decreased dorsiflexion - right;Right genu recurvatum;Left genu recurvatum;Poor foot clearance - right    Ambulation Surface  Level;Indoor      Neuro Re-ed    Neuro Re-ed Details   for strengthening/coordination: on red mat on floor in half kneeling:  single UE support on with contralateral overhead flexion x 10 reps B, diagonals B directions. ; tall kneeling- mini squats x 10 reps with OH press with 3# ball; lateral stepping left to right along length of mat x 3 laps each way, then fwd/bwd stepping along length of mat for 3 laps each way. min guard to min assist with occasional touch to mat table for balance, cues on posture and weight shifting to assist with balance; in tall kneeling with 3# ball- upper trunk rotation left to right x 10 reps each way with min guard assist and cues on posture. Rolling ball (red physioball) in quadruped on red mat in directions: forward, diagonal left and right, with verbal cues and SPTA supervision assistance.        Lumbar Exercises: Seated   Other Seated Lumbar Exercises  Seated on green dynadisc using #3 weighted  ball: Chops x 10 reps B, Overhead flexion x 10 reps, chest press x 10 reps, trunk rotation x 10 reps. Added seated hip abd squeeze on small foam roll and core transverse abdominal squeeze with small perturbations be SPTA 30 sec x 2 reps. Followed by Nyoka Cowden tband rows/ scap retration, bilateral horizontal abduction pull, diagonals, and bicep curls x 10 reps each.       Other Seated Lumbar Exercises  Pt required demonstration, verbal cues for each exercise. Supervision assistance provided for each.       Lumbar Exercises: Quadruped   Opposite Arm/Leg Raise  Right arm/Left leg;Left arm/Right leg;5 reps;Other (comment) With red physioball support on stomach; quad LE hip abd x10B    Other Quadruped Lumbar Exercises  --    Other Quadruped Lumbar Exercises  --         PT Short Term Goals - 06/24/17 2107      PT SHORT TERM GOAL #1   Title  Pt wil participate in and receive appropriate AFO for home/community ambulation    Time  4    Period  Weeks    Status  Revised    Target Date  07/24/17      PT SHORT TERM GOAL #2   Title  Pt will ambulate 230' on level surfaces with cane and appropriate AFO with supervision    Time  4    Period  Weeks    Status  Revised    Target Date  07/24/17      PT SHORT TERM GOAL #5   Title  Pt will improve standing balance as indicated by increase in BERG balance score to > or = 50/56    Baseline  46/56    Time  4    Period  Weeks    Status  Revised    Target Date  07/24/17      PT SHORT TERM GOAL #6   Title  Pt will improve safety with gait as indicated by increase in gait velocity to >/= 2.56ft/sec with cane and AFO    Baseline  2.75ft/sec with cane    Time  4    Period  Weeks    Status  Revised    Target Date  07/24/17        PT Long Term Goals - 06/24/17 2114      PT LONG TERM GOAL #1   Title  Will be independent with HEP with standing HEP    Time  8    Period  Weeks    Status  New    Target Date  08/23/17  PT LONG TERM GOAL #2   Title   Pt will ambulate x 250' outside on pavement and up/down curb with cane, AFO and min A    Time  8    Period  Weeks    Status  New    Target Date  08/23/17      PT LONG TERM GOAL #5   Title  Pt will decrease falls risk as indicated by increase in BERG score to > or = 50/56    Baseline  46/56    Time  8    Period  Weeks    Status  Revised    Target Date  08/23/17      PT LONG TERM GOAL #7   Title  Pt will improve gait velocity to > or = 2.8 ft/sec with cane and appropriate AFO     Time  8    Period  Weeks    Status  Revised    Target Date  08/23/17        Plan - 07/23/17 9323    Clinical Impression Statement  Today's skilled session focused on improving functional transitional movements, UE and LE strengthening, and endurance. Pt required demonstration and verbal cues for core engagement and upright posture during exercises. Pt required multiple brief rest breaks throughtout session. During tall kneeling exercise with trunk rotation with head turn, pt exhibited lateral swaying and minor loss of balance during exercises, which was corrected with cue for core and glute activation. Pt in making progression towards HEP compliance goals. Pt will benefit from skilled PT working towards standing exercise goals, continued gait training, and stair training.     Rehab Potential  Good    Clinical Impairments Affecting Rehab Potential  MS     PT Frequency  2x / week    PT Duration  8 weeks    PT Treatment/Interventions  ADLs/Self Care Home Management;Cryotherapy;Vasopneumatic Device;Taping;Gait training;Stair training;Functional mobility training;Therapeutic activities;Therapeutic exercise;Balance training;Neuromuscular re-education;Passive range of motion;Manual techniques;Patient/family education;Electrical Stimulation;DME Instruction;Orthotic Fit/Training;Energy conservation    PT Next Visit Plan  Continue with half and tall kneeling exercises on physio/therapy ball roll outs in various directions  and tasks for core strengthening; Continue balance training on rocker board; squat walking, wall squats, trunk control training in tall and half kneeling, gait with cane and AFO; Bioness?; Gait training.     PT Home Exercise Plan  Pt has program at home for strengthening and balance    Consulted and Agree with Plan of Care  Patient             Patient will benefit from skilled therapeutic intervention in order to improve the following deficits and impairments:  Abnormal gait, Pain, Decreased range of motion, Decreased balance, Decreased mobility, Difficulty walking, Decreased activity tolerance, Decreased strength, Decreased endurance, Impaired flexibility, Impaired sensation  Visit Diagnosis: Difficulty in walking, not elsewhere classified  Unsteadiness on feet  Muscle weakness (generalized)     Problem List Patient Active Problem List   Diagnosis Date Noted  . Vitamin D insufficiency 11/29/2016  . Hypogonadism in male 11/01/2016  . Type 1 diabetes mellitus ([place])   . Hypothyroidism   . Hypertension   . Hyperlipidemia   . Closed fracture of right fibula and tibia 10/29/2016  . Closed tibia fracture 10/29/2016  . Closed fracture of right tibial plafond with fibula involvement 10/29/2016  . Abnormal liver function tests 03/06/2013  . RASH AND OTHER NONSPECIFIC SKIN ERUPTION 10/07/2009  . SHOULDER PAIN, LEFT 12/27/2008  . Type  I (juvenile type) diabetes mellitus without mention of complication, uncontrolled 12/03/2007  . HYPOTHYROIDISM 11/12/2007  . HYPERCHOLESTEROLEMIA 11/12/2007  . MULTIPLE SCLEROSIS 08/29/2007  . PROLIFERATIVE DIABETIC RETINOPATHY 08/29/2007  . HYPERTENSION 12/25/2006    Carlena Sax 07/23/2017, 12:57 PM  [facility] [address] [place], [place], [address] Phone: [phone]   Fax:  [phone]  Name: Johnathan Ross MRN: [id number] Date of Birth: 1970/10/11

## 2017-07-23 ENCOUNTER — Other Ambulatory Visit (INDEPENDENT_AMBULATORY_CARE_PROVIDER_SITE_OTHER): Payer: Medicare Other

## 2017-07-23 DIAGNOSIS — E038 Other specified hypothyroidism: Secondary | ICD-10-CM | POA: Diagnosis not present

## 2017-07-23 DIAGNOSIS — E063 Autoimmune thyroiditis: Secondary | ICD-10-CM

## 2017-07-23 DIAGNOSIS — E1065 Type 1 diabetes mellitus with hyperglycemia: Secondary | ICD-10-CM

## 2017-07-23 DIAGNOSIS — E7800 Pure hypercholesterolemia, unspecified: Secondary | ICD-10-CM | POA: Diagnosis not present

## 2017-07-23 LAB — TSH: TSH: 1.5 u[IU]/mL (ref 0.35–4.50)

## 2017-07-23 LAB — HEMOGLOBIN A1C: Hgb A1c MFr Bld: 8 % — ABNORMAL HIGH (ref 4.6–6.5)

## 2017-07-23 LAB — LIPID PANEL
Cholesterol: 117 mg/dL (ref 0–200)
HDL: 37.7 mg/dL — ABNORMAL LOW (ref >39.00)
LDL Cholesterol: 70 mg/dL (ref 0–99)
NonHDL: 78.92
Total CHOL/HDL Ratio: 3
Triglycerides: 44 mg/dL (ref 0.0–149.0)
VLDL: 8.8 mg/dL (ref 0.0–40.0)

## 2017-07-23 LAB — GLUCOSE, RANDOM: Glucose, Bld: 186 mg/dL — ABNORMAL HIGH (ref 70–99)

## 2017-07-23 LAB — T4, FREE: Free T4: 1.28 ng/dL (ref 0.60–1.60)

## 2017-07-24 ENCOUNTER — Ambulatory Visit: Payer: Medicare Other | Admitting: Physical Therapy

## 2017-07-24 ENCOUNTER — Encounter: Payer: Self-pay | Admitting: Physical Therapy

## 2017-07-24 DIAGNOSIS — R262 Difficulty in walking, not elsewhere classified: Secondary | ICD-10-CM | POA: Diagnosis not present

## 2017-07-24 DIAGNOSIS — R2681 Unsteadiness on feet: Secondary | ICD-10-CM

## 2017-07-24 DIAGNOSIS — M25671 Stiffness of right ankle, not elsewhere classified: Secondary | ICD-10-CM | POA: Diagnosis not present

## 2017-07-24 DIAGNOSIS — M6281 Muscle weakness (generalized): Secondary | ICD-10-CM | POA: Diagnosis not present

## 2017-07-24 NOTE — Therapy (Signed)
[facility] [address] [place], [place], [address] Phone: [phone]   Fax:  [phone]  Physical Therapy Treatment  Patient Details  Name: Johnathan Ross MRN: [id number] Date of Birth: 1970-11-24 Referring Provider: Altamese Carey, MD   Encounter Date: 07/24/2017   07/24/17 0950  PT Visits / Re-Eval  Visit Number 23  Number of Visits 53  Date for PT Re-Evaluation 08/23/17  Authorization  Authorization Type Medicare  PT Time Calculation  PT Start Time 0845  PT Stop Time 0927  PT Time Calculation (min) 42 min  PT - End of Session  Equipment Utilized During Treatment Gait belt  Activity Tolerance Patient tolerated treatment well;Patient limited by fatigue  Behavior During Therapy [facility] for tasks assessed/performed     Past Medical History:  Diagnosis Date  . Hyperlipidemia   . Hypertension   . Hypogonadism in male 11/01/2016  . Hypothyroidism   . Multiple sclerosis ([place])   . 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 Type 1 diabetes mellitus ([place]) dx'd 1994  . Vitamin D insufficiency 11/29/2016    Past Surgical History:  Procedure Laterality Date  . EYE SURGERY Bilateral    "laser for diabetic retinopathy"  . FRACTURE SURGERY    . OPEN REDUCTION INTERNAL FIXATION (ORIF) TIBIA/FIBULA FRACTURE Right 10/30/2016  . ORIF ANKLE FRACTURE Left 2015  . ORIF TIBIA FRACTURE Right 10/30/2016   Procedure: OPEN REDUCTION INTERNAL FIXATION (ORIF) TIBIA FIBULA FRACTURE;  Surgeon: Altamese Bowman, MD;  Location: [facility];  Service: Orthopedics;  Laterality: Right;  . RETINAL LASER PROCEDURE Bilateral    "for diabetic retinopathy"    There were no vitals filed for this visit.  Subjective Assessment - 07/25/17 1748    Subjective  Pt reports no pain, no fall, and no changes in medications today.     Patient is accompained by:  Family member    Pertinent History  MS dx about 6 years ago    How long can you sit  comfortably?  unlimited    How long can you stand comfortably?  unlimited    How long can you walk comfortably?  household distances due to LE fatigue/weakness    Diagnostic tests  X-ray    Patient Stated Goals  Walk without AD;  On 06/17/17 patient states "for me, coming off of that (RW) is how to measure improvement".     Currently in Pain?  No/denies    Multiple Pain Sites  No        [facility] PT Assessment - 07/25/17 0001      High Level Balance   High Level Balance Activities  Side stepping;Backward walking;Tandem walking;Marching forwards Standing on Airex pad, HT, up/down, EO and EC.      Berg Balance Test   Sit to Stand  Able to stand without using hands and stabilize independently    Standing Unsupported  Able to stand safely 2 minutes    Sitting with Back Unsupported but Feet Supported on Floor or Stool  Able to sit safely and securely 2 minutes    Stand to Sit  Sits safely with minimal use of hands    Transfers  Able to transfer safely, minor use of hands    Standing Unsupported with Eyes Closed  Able to stand 10 seconds with supervision    Standing Ubsupported with Feet Together  Able to place feet together independently and stand for 1 minute with supervision    From Standing, Reach Forward with Outstretched Arm  Can reach confidently >25 cm (10") Can reach confidently >25 ;10 cm"    From Standing Position, Pick up Object from [place] to pick up shoe safely and easily    From Standing Position, Turn to Look Behind Over each Shoulder  Looks behind from both sides and weight shifts well    Turn 360 Degrees  Able to turn 360 degrees safely but slowly    Standing Unsupported, Alternately Place Feet on Step/Stool  Able to complete >2 steps/needs minimal assist    Standing Unsupported, One Foot in Front  Able to plae foot ahead of the other independently and hold 30 seconds able to place foot in front of the other    Standing on One Leg  Unable to try or needs assist to prevent fall     Total Score  44    Berg comment:  44/56      Balance Exercises - 07/25/17 1752      Balance Exercises: Standing   Standing Eyes Opened  Narrow base of support (BOS);Wide (BOA);Head turns;Foam/compliant surface;30 secs    Standing Eyes Closed  Wide (BOA);Foam/compliant surface;Limitations;Head turns;Narrow base of support (BOS);30 secs    Tandem Stance  Eyes open;3 reps;30 secs;Limitations    Standing, One Foot on a Step  Eyes open;Other reps (comment);Limitations;6 inch      Balance Exercises: Standing   Standing Eyes Closed Limitations  on compliant surface: wide base of support progressing to narrow base of support EC no head movements, to EO had movements, to EC head movements. min guard to min assist for balance with occasional UE touch to RW for balance.                                         Tandem Stance Limitations  on floor with UE support on chair back- progressing to letting go and holding balance. min guard to min assist needed for balance.    Standing, One Foot on a Step Limitations  on foot forward on step/other foot on floor; working on balance with tall posture, progressing to alternating UE raises, followed by upper trunk rotation left<>right with reaching behind him. min guard assist to min assist for balance with cues on posture and ex form/technique.                      PT Short Term Goals - 07/25/17 2310      PT SHORT TERM GOAL #1   Title  Pt wil participate in and receive appropriate AFO for home/community ambulation    Baseline  07/24/17: pt agreeable to AFO, waiting on consult    Status  Partially Met      PT SHORT TERM GOAL #2   Title  Pt will ambulate 230' on level surfaces with cane and appropriate AFO with supervision    Baseline  07/14/17: working with cane, not to this distance. awaiting an AFO consult as well    Status  Not Met      PT SHORT TERM GOAL #5   Title  Pt will improve standing balance as indicated by increase in BERG balance score to > or  = 50/56    Baseline  07/24/17: 35/56 scored today, decreased from last assessment    Status  Not Met 44/56      PT SHORT TERM GOAL #6   Title  Pt will  improve safety with gait as indicated by increase in gait velocity to >/= 2.32f/sec with cane and AFO    Baseline  07/24/17: not checked, to be assessed next visit    Time  4    Period  Weeks    Status  On-going Pending AFO          PT Long Term Goals - 06/24/17 2114      PT LONG TERM GOAL #1   Title  Will be independent with HEP with standing HEP    Time  8    Period  Weeks    Status  New    Target Date  08/23/17      PT LONG TERM GOAL #2   Title  Pt will ambulate x 250' outside on pavement and up/down curb with cane, AFO and min A    Time  8    Period  Weeks    Status  New    Target Date  08/23/17      PT LONG TERM GOAL #5   Title  Pt will decrease falls risk as indicated by increase in BERG score to > or = 50/56    Baseline  46/56    Time  8    Period  Weeks    Status  Revised    Target Date  08/23/17      PT LONG TERM GOAL #7   Title  Pt will improve gait velocity to > or = 2.8 ft/sec with cane and appropriate AFO     Time  8    Period  Weeks    Status  Revised    Target Date  08/23/17         Plan - 07/25/17 1743    Clinical Impression Statement  Today's skilled session focused on progress toward STGs with none met to date. Pt's Berg Balance Test score decreased today vs previous assessment. All gait goals not met due to still waiting on an AFO consult and pt continues to fatigue at ~115 feet with cane. Will continued to progress pt toward LTGs.     History and Personal Factors relevant to plan of care:  MS, DM    Clinical Presentation  Stable    Clinical Decision Making  Moderate    Rehab Potential  Good    Clinical Impairments Affecting Rehab Potential  MS     PT Frequency  2x / week    PT Duration  8 weeks    PT Next Visit Plan  Consider retesting low scoring components of the  BERG due to MS variability  per day. Also, plan to check short term goals addressing gait distance and gait velocity. Check up on AFO set up status. Also, consider using Bioness again due to pt request and interest in attempting to try it again. Continue with half and tall kneeling exercises on physio/therapy ball roll outs in various directions and tasks for core strengthening;      PT Home Exercise Plan  Pt has program at home for strengthening and balance    Consulted and Agree with Plan of Care  Patient           Patient will benefit from skilled therapeutic intervention in order to improve the following deficits and impairments:  Abnormal gait, Pain, Decreased range of motion, Decreased balance, Decreased mobility, Difficulty walking, Decreased activity tolerance, Decreased strength, Decreased endurance, Impaired flexibility, Impaired sensation  Visit Diagnosis: Difficulty in walking, not elsewhere classified  Unsteadiness  on feet  Muscle weakness (generalized)     Problem List Patient Active Problem List   Diagnosis Date Noted  . Vitamin D insufficiency 11/29/2016  . Hypogonadism in male 11/01/2016  . Type 1 diabetes mellitus ([place])   . Hypothyroidism   . Hypertension   . Hyperlipidemia   . Closed fracture of right fibula and tibia 10/29/2016  . Closed tibia fracture 10/29/2016  . Closed fracture of right tibial plafond with fibula involvement 10/29/2016  . Abnormal liver function tests 03/06/2013  . RASH AND OTHER NONSPECIFIC SKIN ERUPTION 10/07/2009  . SHOULDER PAIN, LEFT 12/27/2008  . Type I (juvenile type) diabetes mellitus without mention of complication, uncontrolled 12/03/2007  . HYPOTHYROIDISM 11/12/2007  . HYPERCHOLESTEROLEMIA 11/12/2007  . MULTIPLE SCLEROSIS 08/29/2007  . PROLIFERATIVE DIABETIC RETINOPATHY 08/29/2007  . HYPERTENSION 12/25/2006    Carlena Sax 07/25/2017, 5:52 PM  [facility] [address] [place],  [place], [address] Phone: [phone]   Fax:  [phone]  Name: Johnathan Ross MRN: [id number] Date of Birth: 31-Dec-1970  This note has been reviewed and edited by supervising CI.  Willow Ora, PTA, [facility] [address] [place], [place] [address] [phone] 07/25/17, 11:40 PM

## 2017-07-25 ENCOUNTER — Encounter: Payer: Self-pay | Admitting: Endocrinology

## 2017-07-25 NOTE — Progress Notes (Signed)
Subjective:              Patient ID: Johnathan Ross, male   DOB: March 20, 1971, 47 y.o.   MRN: [id number]  Diabetes    Diagnosis: Type 1 diabetes mellitus, date of diagnosis: 1995.   Insulin Pump followup:   CURRENT brand:  Medtronic  630 G   HISTORY: An insulin pump has been in use since 03/13/11.   PUMP settings are basal rate:   Basal rate at 11 AM = 1.7 and from 12 noon until 3 PM = 1.6   1.5 until 4 a.m; 4 AM = 1.35. 6 a.m.= 1.7. 9 AM = 1.4,11 a.m. = 0.9. 12 noon = 1.2, 2 p.m. = 1. 45. 3 PM = 2.15 and 6 p.m. = 1.8   Carbohydrate to insulin ratio: 1: 10, at 11 a.m. = 1: 10, after 6 p.m. = 1:7, hyperglycemia correction factor: 1: 30,and after 10 p.m. 1:30, target 120, active insulin time 4 hours.  Changing infusion set every 3-4 days  Recent history:  His A1c was last 7.4 and is now 8%   Current blood sugar patterns Evaluated from freestyle Libre and pump download, management and problems identified:   He has had less hypoglycemia on this visit compared to his last CGM recordings  However not clear why his A1c is high as his average blood sugars recently are about the same  HIGHEST blood sugars are now at around bedtime, not clear if this is partly related to inadequate bolusing at suppertime  However his basal rate was reduced on the last visit after 10 PM  With increasing his basal rate in the afternoon his blood sugars are not as high before suppertime  He is having only 29% in BOLUS insulin with most of his insulin being delivered by basal  He thinks he is sometimes not eating much carbohydrate or skipping boluses and that is why he has only infrequent boluses showing on his pump download  Occasionally will have relatively high fat fast food meals and appears that he may have low normal sugars right after bolus and rising subsequently without a extended bolus; evening hypoglycemia on this day was related to repeating the same meal and is probably overlapping  active insulin  HYPOGLYCEMIA has been infrequent with a few low sugars overnight or right before suppertime  Nighttime hypoglycemia: The last one likely to be from insulin stacking with high bolus doses  With this his blood sugars still then to be relatively low around midnight and the last 3 or 4 days fasting readings have been near normal  He thinks he can detect hypoglycemia symptoms fairly easily Treatment of hypoglycemia: He is usually drinking juice for this  CGM results:  CGM use %  86  Average and SD  161+/-54  Time in range  60  % Time Above 180  37  % Time above 250   % Time Below target  3    Mean values apply above for all meters except median for One Touch  PRE-MEAL Fasting Lunch Dinner Bedtime Overall  Glucose range:       Mean/median:  142  162  144  193 161    Wt Readings from Last 3 Encounters:  07/26/17 207 lb (93.9 kg)  05/27/17 211 lb 3.2 oz (95.8 kg)  03/21/17 211 lb 9.6 oz (96 kg)     Lab Results  Component Value Date   HGBA1C 8.0 (H) 07/23/2017   HGBA1C 7.4 03/21/2017  HGBA1C 7.6 (H) 12/06/2016   Lab Results  Component Value Date   MICROALBUR 1.6 10/27/2014   LDLCALC 70 07/23/2017   CREATININE 1.24 05/24/2017     OTHER active problems discussed today: See review of systems    Allergies as of 07/26/2017   No Known Allergies     Medication List        Accurate as of 07/26/17 11:59 PM. Always use your most recent med list.          acetaminophen 500 MG tablet Commonly known as:  TYLENOL Take 2 tablets (1,000 mg total) by mouth every 6 (six) hours.   AMPYRA 10 MG Tb12 Generic drug:  dalfampridine Take 10 mg by mouth 2 (two) times daily. Take one tablet (10 mg) by mouth every morning and mid afternoon   CONTOUR NEXT TEST test strip Generic drug:  glucose blood USE AS INSTRUCTED TO CHECK BLOOD SUGARS 8 TIMES PER DAY DX CODE E10.65   insulin aspart 100 UNIT/ML injection Commonly known as:  NOVOLOG Use up to 80 units in  insulin pump daily-Dx code E10.8   insulin pump Soln Inject into the skin continuous. Novolog   levothyroxine 175 MCG tablet Commonly known as:  SYNTHROID Take 1 tablet (175 mcg total) by mouth daily before breakfast.   lisinopril 10 MG tablet Commonly known as:  PRINIVIL,ZESTRIL TAKE 1 TABLET BY MOUTH EVERY DAY   simvastatin 20 MG tablet Commonly known as:  ZOCOR TAKE 1 TABLET BY MOUTH AT BEDTIME       Allergies: No Known Allergies  Past Medical History:  Diagnosis Date  . Hyperlipidemia   . Hypertension   . Hypogonadism in male 11/01/2016  . Hypothyroidism   . Multiple sclerosis ([place])   . 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 Type 1 diabetes mellitus ([place]) dx'd 1994  . Vitamin D insufficiency 11/29/2016    Past Surgical History:  Procedure Laterality Date  . EYE SURGERY Bilateral    "laser for diabetic retinopathy"  . FRACTURE SURGERY    . OPEN REDUCTION INTERNAL FIXATION (ORIF) TIBIA/FIBULA FRACTURE Right 10/30/2016  . ORIF ANKLE FRACTURE Left 2015  . ORIF TIBIA FRACTURE Right 10/30/2016   Procedure: OPEN REDUCTION INTERNAL FIXATION (ORIF) TIBIA FIBULA FRACTURE;  Surgeon: Altamese [place], MD;  Location: [facility];  Service: Orthopedics;  Laterality: Right;  . RETINAL LASER PROCEDURE Bilateral    "for diabetic retinopathy"    Family History  Problem Relation Age of Onset  . Diabetes Sister   . Colon cancer Maternal Grandmother   . Colon polyps Maternal Uncle     Social History:  reports that  has never smoked. he has never used smokeless tobacco. He reports that he does not drink alcohol or use drugs.    Review of Systems      HYPERCHOLESTEROLEMIA: He has been taking simvastatin 20 mg   LDL is below 70   Also no change in ALT  Lab Results  Component Value Date   CHOL 117 07/23/2017   HDL 37.70 (L) 07/23/2017   LDLCALC 70 07/23/2017   LDLDIRECT 139.7 04/09/2014   TRIG 44.0 07/23/2017   CHOLHDL 3 07/23/2017   Lab Results  Component Value Date    ALT 22 05/24/2017    HYPERTENSION:  has  been on lisinopril with good control  of blood pressure   He has MULTIPLE sclerosis, Followed in [place] by neurologist  HYPOTHYROIDISM: This is long-standing  His thyroid requirement has changed periodically and has been taking higher doses  in 2018  More recently he does has been somewhat stable and is now taking a prescription for 175 mcg daily Does not feel unusually fatigued when TSH is mildly increased   Lab Results  Component Value Date   TSH 1.50 07/23/2017   TSH 5.56 (H) 05/24/2017   TSH 0.78 03/18/2017   FREET4 1.28 07/23/2017   FREET4 0.88 05/24/2017   FREET4 1.16 03/18/2017        Objective:   Physical Exam  BP 134/76   Pulse 98   Ht 6\' 1"  (1.854 m)   Wt 207 lb (93.9 kg)   SpO2 96%   BMI 27.31 kg/m          Assessment:      DIABETES type I on insulin pump:  See history of present illness for detailed discussion of current diabetes management, blood sugar patterns and problems identified  He has a higher A1c of 8%  Despite using continuous glucose monitoring his control level has been inconsistent Day-to-day management in detail was discussed including addressing various types of meals, bolusing consistently, site rotation Also discussed use of extended boluses and avoiding insulin stacking  HIGHEST blood sugars are late in the evening now and some may be related to inadequate suppertime boluses also Compared to last time hypoglycemia has been infrequent   HYPOTHYROIDISM:  His thyroid levels have been variable  With minimal changes his TSH levels are now more stable and the last one normal Also not symptomatic with mild hypothyroidism  HYPERTENSION: His blood pressure is well controlled      Plan:      Basal rate changes and other instructions:  Please try to enter the blood sugar consistently in the pump at every time when you are having a meal especially breakfast and supper  Use  extended/dual boluses for higher fat meals and may do a correction if blood sugar continues to be high.  BASAL rate changes: Midnight = 1.2 4 a.m. = 1.1 At 7:30 AM until 12 noon = 1.6 8 PM = 1.8  Need to bolus consistently for every meal and snack that has carbohydrate and also if blood sugars are rising on the sensor    THYROID: He will continue 175 g daily which is keeping his TSH normal   Counseling time on subjects discussed in assessment and plan sections is over 50% of today's 25 minute visit    Patient Instructions  Please try to enter the blood sugar consistently in the pump at every time when you are having a meal especially breakfast and supper Use extended/dual boluses for higher fat meals and may do a correction if blood sugar continues to be high.  BASAL rate changes: Midnight = 1.2 4 a.m. = 1.1 At 7:30 AM until 12 noon = 1.6 8 PM = 1.8        Elayne Snare

## 2017-07-26 ENCOUNTER — Ambulatory Visit (INDEPENDENT_AMBULATORY_CARE_PROVIDER_SITE_OTHER): Payer: Medicare Other | Admitting: Endocrinology

## 2017-07-26 ENCOUNTER — Encounter: Payer: Self-pay | Admitting: Endocrinology

## 2017-07-26 VITALS — BP 134/76 | HR 98 | Ht 73.0 in | Wt 207.0 lb

## 2017-07-26 DIAGNOSIS — E1065 Type 1 diabetes mellitus with hyperglycemia: Secondary | ICD-10-CM | POA: Diagnosis not present

## 2017-07-26 DIAGNOSIS — E039 Hypothyroidism, unspecified: Secondary | ICD-10-CM

## 2017-07-26 DIAGNOSIS — E7800 Pure hypercholesterolemia, unspecified: Secondary | ICD-10-CM | POA: Diagnosis not present

## 2017-07-26 NOTE — Patient Instructions (Signed)
Please try to enter the blood sugar consistently in the pump at every time when you are having a meal especially breakfast and supper Use extended/dual boluses for higher fat meals and may do a correction if blood sugar continues to be high.  BASAL rate changes: Midnight = 1.2 4 a.m. = 1.1 At 7:30 AM until 12 noon = 1.6 8 PM = 1.8

## 2017-07-30 ENCOUNTER — Telehealth: Payer: Self-pay | Admitting: Physical Therapy

## 2017-07-30 NOTE — Telephone Encounter (Signed)
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  He will benefit from use of bilateral AFO's in order to improve safety with functional mobility due to right ankle/LE weakness from surgery and MS. His left foot catches at times as well and may benefit from an AFO as well.   If you agree, please submit request in EPIC under referral for DME (list right and left AFO in comments) or fax to [facility] at [phone].   Thank you,  Willow Ora, PTA, [facility] [place] [address] [place], [place] [address] [phone] 07/30/17, 6:28 [place] [address] [place],   [address] Phone:  [phone] Fax:  [phone]

## 2017-07-31 ENCOUNTER — Encounter: Payer: Self-pay | Admitting: Physical Therapy

## 2017-07-31 ENCOUNTER — Ambulatory Visit: Payer: Medicare Other | Attending: Orthopedic Surgery | Admitting: Physical Therapy

## 2017-07-31 DIAGNOSIS — R2681 Unsteadiness on feet: Secondary | ICD-10-CM | POA: Diagnosis not present

## 2017-07-31 DIAGNOSIS — M6281 Muscle weakness (generalized): Secondary | ICD-10-CM | POA: Diagnosis not present

## 2017-07-31 DIAGNOSIS — M21371 Foot drop, right foot: Secondary | ICD-10-CM | POA: Diagnosis not present

## 2017-07-31 DIAGNOSIS — M21372 Foot drop, left foot: Secondary | ICD-10-CM | POA: Diagnosis not present

## 2017-07-31 DIAGNOSIS — R262 Difficulty in walking, not elsewhere classified: Secondary | ICD-10-CM | POA: Diagnosis not present

## 2017-07-31 DIAGNOSIS — M25671 Stiffness of right ankle, not elsewhere classified: Secondary | ICD-10-CM | POA: Insufficient documentation

## 2017-07-31 NOTE — Therapy (Signed)
[facility] [address] [place], [place], [address] Phone: [phone]   Fax:  [phone]  Physical Therapy Treatment  Patient Details  Name: Johnathan Ross MRN: [id number] Date of Birth: [birth year] Referring Provider: Altamese [place], MD   Encounter Date: 07/31/2017  PT End of Session - 07/31/17 1130    Visit Number  52    Number of Visits  7    Date for PT Re-Evaluation  08/23/17    Authorization Type  Medicare    PT Start Time  0845    PT Stop Time  0930    PT Time Calculation (min)  45 min    Equipment Utilized During Treatment  Gait belt    Activity Tolerance  Patient tolerated treatment well;Patient limited by fatigue    Behavior During Therapy  [facility] for tasks assessed/performed       Past Medical History:  Diagnosis Date  . Hyperlipidemia   . Hypertension   . Hypogonadism in male 11/01/2016  . Hypothyroidism   . Multiple sclerosis ([place])   . 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 Type 1 diabetes mellitus ([place]) dx'd 1994  . Vitamin D insufficiency 11/29/2016    Past Surgical History:  Procedure Laterality Date  . EYE SURGERY Bilateral    "laser for diabetic retinopathy"  . FRACTURE SURGERY    . OPEN REDUCTION INTERNAL FIXATION (ORIF) TIBIA/FIBULA FRACTURE Right 10/30/2016  . ORIF ANKLE FRACTURE Left 2015  . ORIF TIBIA FRACTURE Right 10/30/2016   Procedure: OPEN REDUCTION INTERNAL FIXATION (ORIF) TIBIA FIBULA FRACTURE;  Surgeon: Altamese [place], MD;  Location: [facility];  Service: Orthopedics;  Laterality: Right;  . RETINAL LASER PROCEDURE Bilateral    "for diabetic retinopathy"    There were no vitals filed for this visit.  Subjective Assessment - 07/31/17 0850    Subjective  Pt reports no pain, no fall, and no changes in medications today. Pt stated. I'm just feel like I don't have as much energy today; I feel both of my leg muscles shaking a bit more (pointing to quadriceps)."      Patient is  accompained by:  Family member    Pertinent History  MS dx about 6 years ago    How long can you sit comfortably?  unlimited    How long can you stand comfortably?  unlimited    How long can you walk comfortably?  household distances due to LE fatigue/weakness    Diagnostic tests  X-ray    Patient Stated Goals  Walk without AD;  On 06/17/17 patient states "for me, coming off of that (RW) is how to measure improvement".     Currently in Pain?  No/denies    Multiple Pain Sites  No           [facility] Adult PT Treatment/Exercise - 07/31/17 1109      Transfers   Transfers  Sit to Stand;Stand to Sit    Sit to Stand  5: Supervision;With upper extremity assist    Stand to Sit  5: Supervision;With upper extremity assist      Ambulation/Gait   Ambulation/Gait  Yes    Ambulation/Gait Assistance  4: Min assist    Ambulation/Gait Assistance Details  Pt required multiple brief standing breaks, used RW during all ambulation around clinic.     Ambulation Distance (Feet)  75 Feet plus 25 feet, and 75 feet exiting clinic.     Assistive device  Rolling walker    Gait Pattern  Step-through pattern;Decreased dorsiflexion - right;Right genu recurvatum;Left genu recurvatum;Poor foot clearance - right    Ambulation Surface  Level;Indoor      Neuro Re-ed    Neuro Re-ed Details   To address strengthening/coordination: on red mat on floor and intermediate UE support on PT mat for balance support: In half kneeling: single UE support on with contralateral overhead flexion x 15 reps B. ; Tall kneeling- mini squats x 15 reps with OH press with 3# ball; lateral stepping left to right along length of mat x 3 laps each way, then fwd/bwd stepping along length of mat for 3 laps each way. min guard to min assist with occasional touch to mat table for balance, cues on posture and weight shifting to assist with balance; Added in tall kneeling with 3# ball- forward reach with small ball roll on PT mat with contralateral UE on  mat, moving #3 ball fwd/back, left/right, clockwise/CCW x 10 reps each for added scapular strengthening with balance/core strengthening component (to simulate functional task at home of washing his vehicle). Followed by, upper trunk rotation left to right x 10 reps each way with min guard assist and cues on posture. Rolling ball (red physioball) in quadruped on red mat in directions: forward, diagonal left and right, with verbal cues and SPTA supervision assistance.            Balance Exercises - 07/31/17 1123      Balance Exercises: Standing   Standing Eyes Closed  Wide (BOA);Foam/compliant surface;Limitations;Head turns;Narrow base of support (BOS);30 secs;2 reps Standing on airex pad    Rockerboard  Anterior/posterior;Lateral;EO;EC;30 seconds;Intermittent UE support;Other (comment) 30 sec of each x 3 reps.       Balance Exercises: Standing   Rebounder Limitations  Performed both ways on balance board with no UE support/intermittent touch to bars for balance: EO and EC rocking board A/P and laterally left and right with emphasis on tall posture; min to mod assist for balance with cues on posture and weight shifitng to assist with balance. Pt required demo and verbal cues for tasks, foot placement, and weight shifting while standing on rockerboard.            PT Short Term Goals - 07/25/17 2310      PT SHORT TERM GOAL #1   Title  Pt wil participate in and receive appropriate AFO for home/community ambulation    Baseline  07/24/17: pt agreeable to AFO, waiting on consult    Status  Partially Met      PT SHORT TERM GOAL #2   Title  Pt will ambulate 230' on level surfaces with cane and appropriate AFO with supervision    Baseline  07/14/17: working with cane, not to this distance. awaiting an AFO consult as well    Status  Not Met      PT SHORT TERM GOAL #5   Title  Pt will improve standing balance as indicated by increase in BERG balance score to > or = 50/56    Baseline  07/24/17:  35/56 scored today, decreased from last assessment    Status  Not Met 44/56      PT SHORT TERM GOAL #6   Title  Pt will improve safety with gait as indicated by increase in gait velocity to >/= 2.18f/sec with cane and AFO    Baseline  07/24/17: not checked, to be assessed next visit    Time  4    Period  Weeks    Status  On-going Pending AFO        PT Long Term Goals - 06/24/17 2114      PT LONG TERM GOAL #1   Title  Will be independent with HEP with standing HEP    Time  8    Period  Weeks    Status  New    Target Date  08/23/17      PT LONG TERM GOAL #2   Title  Pt will ambulate x 250' outside on pavement and up/down curb with cane, AFO and min A    Time  8    Period  Weeks    Status  New    Target Date  08/23/17      PT LONG TERM GOAL #5   Title  Pt will decrease falls risk as indicated by increase in BERG score to > or = 50/56    Baseline  46/56    Time  8    Period  Weeks    Status  Revised    Target Date  08/23/17      PT LONG TERM GOAL #7   Title  Pt will improve gait velocity to > or = 2.8 ft/sec with cane and appropriate AFO     Time  8    Period  Weeks    Status  Revised    Target Date  08/23/17          Plan - 07/31/17 1131    Clinical Impression Statement  Today's skilled session focused on improving pt's balance on uncompliant surfaces, upper and lower body strengthening, and coordination. Pt required multiple short brief standing breaks throughout session due to increased fatigue today. However, pt very willing and gave full effort to participate in all activities presented to him. Pt would benefit from skilled PT interventions towards unmet LTG goals.       History and Personal Factors relevant to plan of care:  MS, DM    Clinical Presentation  Stable    Clinical Decision Making  Moderate    Rehab Potential  Good    Clinical Impairments Affecting Rehab Potential  MS     PT Frequency  2x / week    PT Duration  8 weeks    PT  Treatment/Interventions  ADLs/Self Care Home Management;Cryotherapy;Vasopneumatic Device;Taping;Gait training;Stair training;Functional mobility training;Therapeutic activities;Therapeutic exercise;Balance training;Neuromuscular re-education;Passive range of motion;Manual techniques;Patient/family education;Electrical Stimulation;DME Instruction;Orthotic Fit/Training;Energy conservation    PT Next Visit Plan  Check up on AFO set up status. Continue with half and tall kneeling exercises on physio/therapy ball roll outs in various directions and tasks for core strengthening; pt enjoyed new exercise with added scapular stability component.      PT Home Exercise Plan  Pt has program at home for strengthening and balance    Consulted and Agree with Plan of Care  Patient    Family Member Consulted  mom       Patient will benefit from skilled therapeutic intervention in order to improve the following deficits and impairments:  Abnormal gait, Pain, Decreased range of motion, Decreased balance, Decreased mobility, Difficulty walking, Decreased activity tolerance, Decreased strength, Decreased endurance, Impaired flexibility, Impaired sensation  Visit Diagnosis: Difficulty in walking, not elsewhere classified  Unsteadiness on feet  Muscle weakness (generalized)  Stiffness of right ankle, not elsewhere classified     Problem List Patient Active Problem List   Diagnosis Date Noted  . Vitamin D insufficiency 11/29/2016  . Hypogonadism in male 11/01/2016  . Type 1  diabetes mellitus ([place])   . Hypothyroidism   . Hypertension   . Hyperlipidemia   . Closed fracture of right fibula and tibia 10/29/2016  . Closed tibia fracture 10/29/2016  . Closed fracture of right tibial plafond with fibula involvement 10/29/2016  . Abnormal liver function tests 03/06/2013  . RASH AND OTHER NONSPECIFIC SKIN ERUPTION 10/07/2009  . SHOULDER PAIN, LEFT 12/27/2008  . Type I (juvenile type) diabetes mellitus without  mention of complication, uncontrolled 12/03/2007  . HYPOTHYROIDISM 11/12/2007  . HYPERCHOLESTEROLEMIA 11/12/2007  . MULTIPLE SCLEROSIS 08/29/2007  . PROLIFERATIVE DIABETIC RETINOPATHY 08/29/2007  . HYPERTENSION 12/25/2006    Johnathan Ross 07/31/2017, 11:40 AM  [facility] [address] [place], [place], [address] Phone: [phone]   Fax:  [phone]  Name: Johnathan Ross MRN: [id number] Date of Birth: Apr 02, 1971

## 2017-08-02 ENCOUNTER — Encounter: Payer: Self-pay | Admitting: Physical Therapy

## 2017-08-02 ENCOUNTER — Ambulatory Visit: Payer: Medicare Other | Admitting: Physical Therapy

## 2017-08-02 DIAGNOSIS — R262 Difficulty in walking, not elsewhere classified: Secondary | ICD-10-CM | POA: Diagnosis not present

## 2017-08-02 DIAGNOSIS — M21371 Foot drop, right foot: Secondary | ICD-10-CM | POA: Diagnosis not present

## 2017-08-02 DIAGNOSIS — M6281 Muscle weakness (generalized): Secondary | ICD-10-CM

## 2017-08-02 DIAGNOSIS — M21372 Foot drop, left foot: Secondary | ICD-10-CM | POA: Diagnosis not present

## 2017-08-02 DIAGNOSIS — M25671 Stiffness of right ankle, not elsewhere classified: Secondary | ICD-10-CM | POA: Diagnosis not present

## 2017-08-02 DIAGNOSIS — R2681 Unsteadiness on feet: Secondary | ICD-10-CM | POA: Diagnosis not present

## 2017-08-02 NOTE — Therapy (Signed)
[facility] [address] [place], [place], [address] Phone: [phone]   Fax:  [phone]  Physical Therapy Treatment  Patient Details  Name: Johnathan Ross MRN: [id number] Date of Birth: [birth year] Referring Provider: Altamese Jayuya, MD   Encounter Date: 08/02/2017  PT End of Session - 08/02/17 1416    Visit Number  38    Number of Visits  11    Date for PT Re-Evaluation  08/23/17    Authorization Type  Medicare    PT Start Time  4431 late start due to PT's prior pt became ill during session    PT Stop Time  0931    PT Time Calculation (min)  36 min    Activity Tolerance  Patient tolerated treatment well    Behavior During Therapy  [facility] for tasks assessed/performed not agitated, but frustrated       Past Medical History:  Diagnosis Date  . Hyperlipidemia   . Hypertension   . Hypogonadism in male 11/01/2016  . Hypothyroidism   . Multiple sclerosis ([place])   . 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 Type 1 diabetes mellitus ([place]) dx'd 1994  . Vitamin D insufficiency 11/29/2016    Past Surgical History:  Procedure Laterality Date  . EYE SURGERY Bilateral    "laser for diabetic retinopathy"  . FRACTURE SURGERY    . OPEN REDUCTION INTERNAL FIXATION (ORIF) TIBIA/FIBULA FRACTURE Right 10/30/2016  . ORIF ANKLE FRACTURE Left 2015  . ORIF TIBIA FRACTURE Right 10/30/2016   Procedure: OPEN REDUCTION INTERNAL FIXATION (ORIF) TIBIA FIBULA FRACTURE;  Surgeon: Altamese Hickory, MD;  Location: [facility];  Service: Orthopedics;  Laterality: Right;  . RETINAL LASER PROCEDURE Bilateral    "for diabetic retinopathy"    There were no vitals filed for this visit.  Subjective Assessment - 08/02/17 1423    Subjective  Pt reports no pain, no fall, and no changes in medications today. Questioning why he is no longer using the electrical stimulation (Bioness) and why we are considering an AFO (clearly part of his plan from last 3  visits and attempts to contact his MD for an order are in his chart).     Patient is accompained by:  Family member    Pertinent History  MS dx about 6 years ago    How long can you sit comfortably?  unlimited    How long can you stand comfortably?  unlimited    How long can you walk comfortably?  household distances due to LE fatigue/weakness    Diagnostic tests  X-ray    Patient Stated Goals  Walk without AD;  On 06/17/17 patient states "for me, coming off of that (RW) is how to measure improvement".     Currently in Pain?  No/denies        Treatment-  Gait training with and without Rt AFO (carbon-fiber walk-on): 100 ft, 30 ft, 30 ft, 30 ft Patient's genu recurvatum so forceful it overcomes support of AFO and is ineffective for this purpose. It did assist his rt foot clearance, however pt did scuff/drag his foot x 2 with AFO. Educated pt on controlling genu recurvatum with shorter step length and focus on controlling it. Pt did well with ambulating along counter with this technique, however as he resumed using RW he returned to prior pattern with bil genu recurvatum                        PT  Education - 08/02/17 1425    Education provided  Yes    Education Details  Process for obtaining an AFO and why PTA contacted MD for an order; reasons for using AFO vs Bioness; need to work on not hyperextending bil knees in standing and walking for joint protection    Person(s) Educated  Patient    Methods  Explanation;Demonstration    Comprehension  Verbalized understanding;Need further instruction       PT Short Term Goals - 07/25/17 2310      PT SHORT TERM GOAL #1   Title  Pt wil participate in and receive appropriate AFO for home/community ambulation    Baseline  07/24/17: pt agreeable to AFO, waiting on consult    Status  Partially Met      PT SHORT TERM GOAL #2   Title  Pt will ambulate 230' on level surfaces with cane and appropriate AFO with supervision     Baseline  07/14/17: working with cane, not to this distance. awaiting an AFO consult as well    Status  Not Met      PT SHORT TERM GOAL #5   Title  Pt will improve standing balance as indicated by increase in BERG balance score to > or = 50/56    Baseline  07/24/17: 35/56 scored today, decreased from last assessment    Status  Not Met 44/56      PT SHORT TERM GOAL #6   Title  Pt will improve safety with gait as indicated by increase in gait velocity to >/= 2.53f/sec with cane and AFO    Baseline  07/24/17: not checked, to be assessed next visit    Time  4    Period  Weeks    Status  On-going Pending AFO        PT Long Term Goals - 06/24/17 2114      PT LONG TERM GOAL #1   Title  Will be independent with HEP with standing HEP    Time  8    Period  Weeks    Status  New    Target Date  08/23/17      PT LONG TERM GOAL #2   Title  Pt will ambulate x 250' outside on pavement and up/down curb with cane, AFO and min A    Time  8    Period  Weeks    Status  New    Target Date  08/23/17      PT LONG TERM GOAL #5   Title  Pt will decrease falls risk as indicated by increase in BERG score to > or = 50/56    Baseline  46/56    Time  8    Period  Weeks    Status  Revised    Target Date  08/23/17      PT LONG TERM GOAL #7   Title  Pt will improve gait velocity to > or = 2.8 ft/sec with cane and appropriate AFO     Time  8    Period  Weeks    Status  Revised    Target Date  08/23/17            Plan - 08/02/17 1427    Clinical Impression Statement  Session began late due to the patient prior to his appointment became ill and required incr time/assist. Patient seemed unsettled that he was with a different therapist today and explained KJuliann Pulse PTA was out sick. Patient also  became frustrated/upset when discussed trying AFO for RLE and had many questions re: why the electrical stimulation was stopped. He was understanding that I was new to his case today and did not have access to  his usual PTA to discuss why that decision was made. Session focused on educating pt on pro's/con's of AFO vs Bioness (including insurance/financial concerns). Unclear if patient has cognitive/memory deficits related to the plan for AFO vs Bioness or if he was taking to the "opportunity" to get another PT's opinion/explanation. Educated pt on joint protection of bil knees and exercising/standing/walking with "soft knees" with pt able to comply throughout session with min cues. Will follow-up with his primary team re: his questions/concerns prior to his next visit.     Rehab Potential  Good    Clinical Impairments Affecting Rehab Potential  MS     PT Frequency  2x / week    PT Duration  8 weeks    PT Treatment/Interventions  ADLs/Self Care Home Management;Cryotherapy;Vasopneumatic Device;Taping;Gait training;Stair training;Functional mobility training;Therapeutic activities;Therapeutic exercise;Balance training;Neuromuscular re-education;Passive range of motion;Manual techniques;Patient/family education;Electrical Stimulation;DME Instruction;Orthotic Fit/Training;Energy conservation    PT Next Visit Plan  Answer pt's questions re: plan for AFO and no longer using Bioness (he wants to use Bioness to continue to "work his muscles" and work on timing/sequencing of muscles. ?? could benefit from Bioness on left hamstrings in addition to right side??) Check up on AFO set up status. Continue with half and tall kneeling exercises on physio/therapy ball roll outs in various directions and tasks for core strengthening; pt enjoyed new exercise with added scapular stability component.      PT Home Exercise Plan  Pt has program at home for strengthening and balance    Consulted and Agree with Plan of Care  Patient    Family Member Consulted  --       Patient will benefit from skilled therapeutic intervention in order to improve the following deficits and impairments:  Abnormal gait, Pain, Decreased range of motion,  Decreased balance, Decreased mobility, Difficulty walking, Decreased activity tolerance, Decreased strength, Decreased endurance, Impaired flexibility, Impaired sensation  Visit Diagnosis: Difficulty in walking, not elsewhere classified  Muscle weakness (generalized)     Problem List Patient Active Problem List   Diagnosis Date Noted  . Vitamin D insufficiency 11/29/2016  . Hypogonadism in male 11/01/2016  . Type 1 diabetes mellitus ([place])   . Hypothyroidism   . Hypertension   . Hyperlipidemia   . Closed fracture of right fibula and tibia 10/29/2016  . Closed tibia fracture 10/29/2016  . Closed fracture of right tibial plafond with fibula involvement 10/29/2016  . Abnormal liver function tests 03/06/2013  . RASH AND OTHER NONSPECIFIC SKIN ERUPTION 10/07/2009  . SHOULDER PAIN, LEFT 12/27/2008  . Type I (juvenile type) diabetes mellitus without mention of complication, uncontrolled 12/03/2007  . HYPOTHYROIDISM 11/12/2007  . HYPERCHOLESTEROLEMIA 11/12/2007  . MULTIPLE SCLEROSIS 08/29/2007  . PROLIFERATIVE DIABETIC RETINOPATHY 08/29/2007  . HYPERTENSION 12/25/2006    Rexanne Mano , PT 08/02/2017, 2:40 PM  [facility] [address] [place], [place], [address] Phone: [phone]   Fax:  [phone]  Name: Johnathan Ross MRN: [id number] Date of Birth: Feb 18, 1971

## 2017-08-05 ENCOUNTER — Ambulatory Visit: Payer: Medicare Other | Admitting: Physical Therapy

## 2017-08-05 DIAGNOSIS — F40240 Claustrophobia: Secondary | ICD-10-CM | POA: Diagnosis not present

## 2017-08-05 DIAGNOSIS — G35 Multiple sclerosis: Secondary | ICD-10-CM | POA: Diagnosis not present

## 2017-08-05 DIAGNOSIS — Z5181 Encounter for therapeutic drug level monitoring: Secondary | ICD-10-CM | POA: Diagnosis not present

## 2017-08-05 DIAGNOSIS — M62838 Other muscle spasm: Secondary | ICD-10-CM | POA: Diagnosis not present

## 2017-08-05 DIAGNOSIS — Z79899 Other long term (current) drug therapy: Secondary | ICD-10-CM | POA: Diagnosis not present

## 2017-08-05 DIAGNOSIS — R5383 Other fatigue: Secondary | ICD-10-CM | POA: Diagnosis not present

## 2017-08-06 ENCOUNTER — Telehealth: Payer: Self-pay | Admitting: Endocrinology

## 2017-08-06 ENCOUNTER — Other Ambulatory Visit: Payer: Self-pay | Admitting: Urology

## 2017-08-06 DIAGNOSIS — G35 Multiple sclerosis: Secondary | ICD-10-CM

## 2017-08-06 NOTE — Telephone Encounter (Signed)
Patient is calling on the status of prescription for his libre sensor. Patient ask for a call, please call him

## 2017-08-07 ENCOUNTER — Ambulatory Visit: Payer: Medicare Other | Admitting: Physical Therapy

## 2017-08-07 ENCOUNTER — Other Ambulatory Visit: Payer: Self-pay

## 2017-08-07 ENCOUNTER — Encounter: Payer: Self-pay | Admitting: Physical Therapy

## 2017-08-07 DIAGNOSIS — M25671 Stiffness of right ankle, not elsewhere classified: Secondary | ICD-10-CM | POA: Diagnosis not present

## 2017-08-07 DIAGNOSIS — M6281 Muscle weakness (generalized): Secondary | ICD-10-CM

## 2017-08-07 DIAGNOSIS — R262 Difficulty in walking, not elsewhere classified: Secondary | ICD-10-CM

## 2017-08-07 DIAGNOSIS — R2681 Unsteadiness on feet: Secondary | ICD-10-CM

## 2017-08-07 DIAGNOSIS — M21372 Foot drop, left foot: Secondary | ICD-10-CM | POA: Diagnosis not present

## 2017-08-07 DIAGNOSIS — M21371 Foot drop, right foot: Secondary | ICD-10-CM | POA: Diagnosis not present

## 2017-08-07 NOTE — Therapy (Signed)
[facility] [address] [place], [place], [address] Phone: [phone]   Fax:  [phone]  Physical Therapy Treatment  Patient Details  Name: Johnathan Ross MRN: [id number] Date of Birth: 05-Jan-1971 Referring Provider: Altamese [place], MD   Encounter Date: 08/07/2017  PT End of Session - 08/07/17 1140    Visit Number  69    Number of Visits  69    Date for PT Re-Evaluation  08/23/17    Authorization Type  Medicare    PT Start Time  0847    PT Stop Time  0935    PT Time Calculation (min)  48 min    Activity Tolerance  Patient tolerated treatment well    Behavior During Therapy  [facility] for tasks assessed/performed not agitated, but frustrated       Past Medical History:  Diagnosis Date  . Hyperlipidemia   . Hypertension   . Hypogonadism in male 11/01/2016  . Hypothyroidism   . Multiple sclerosis ([place])   . 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 Type 1 diabetes mellitus ([place]) dx'd 1994  . Vitamin D insufficiency 11/29/2016    Past Surgical History:  Procedure Laterality Date  . EYE SURGERY Bilateral    "laser for diabetic retinopathy"  . FRACTURE SURGERY    . OPEN REDUCTION INTERNAL FIXATION (ORIF) TIBIA/FIBULA FRACTURE Right 10/30/2016  . ORIF ANKLE FRACTURE Left 2015  . ORIF TIBIA FRACTURE Right 10/30/2016   Procedure: OPEN REDUCTION INTERNAL FIXATION (ORIF) TIBIA FIBULA FRACTURE;  Surgeon: Altamese Foster, MD;  Location: [facility];  Service: Orthopedics;  Laterality: Right;  . RETINAL LASER PROCEDURE Bilateral    "for diabetic retinopathy"    There were no vitals filed for this visit.  Subjective Assessment - 08/07/17 0859    Subjective  Discussed recent MD visits with endocrinology and neurology.  Neurology feels pt would benefit from continued use of Bioness.  Discussed thyroid function, DM and upcoming MRI to assess for new lesions.  No issues or falls to report.      Patient is accompained by:  Family  member    Pertinent History  MS dx about 6 years ago    How long can you sit comfortably?  unlimited    How long can you stand comfortably?  unlimited    How long can you walk comfortably?  household distances due to LE fatigue/weakness    Diagnostic tests  X-ray    Patient Stated Goals  Walk without AD;  On 06/17/17 patient states "for me, coming off of that (RW) is how to measure improvement".     Currently in Pain?  No/denies          [facility] Adult PT Treatment/Exercise - 08/07/17 1124      Ambulation/Gait   Ambulation/Gait  Yes    Ambulation/Gait Assistance  4: Min guard    Ambulation/Gait Assistance Details  rest breaks as hamstring mm fatigued due to continue R foot drag; verbal cues for decreased step length/stride length and bringing pelvis forwards over BOS to minimize genu recurvatum during RLE stance phase    Ambulation Distance (Feet)  345 Feet    Assistive device  Rolling walker    Gait Pattern  Step-through pattern;Decreased hip/knee flexion - right;Poor foot clearance - right    Ambulation Surface  Level;Indoor      Self-Care   Self-Care  Other Self-Care Comments    Other Self-Care Comments   Continued discussion regarding benefit and use of BIONESS in  therapy and discussed pt willingness to utilize at home if approved by insurance, rationale for AFO assessment and discussed patient's most recent fall.  Pt states he was cleaning his room and while walking out of room he had clothes in one hand, trash in the other and LOB falling, hitting ribs on arm rest of couch.  Pt also states that he takes his trash out of the house and walks to end of driveway to trash can and then returns without AD.  Discussed safety issues, falls risk and risk for another fracture.  Made the following recommendations: use of reusable bag to carry items in the house (on shoulder or RW) to have hands free.  Also recommended keeping trash can by porch to deposit trash into and then walking it down on trash  day and using AD to return to house.  Pt agreeable to AFO assessment, return to use of BIONESS and possible use of Bioness at home.  Also discussed other health conditions - DM, thyroid - and how those may be affecting nerve function, fatigue and rate of recovery.  Pt verbalized understanding.       Modalities   Modalities  Teacher, English as a foreign language Location  bilat anterior tibialis    Electrical Stimulation Action  closed and open chain ankle DF    Electrical Stimulation Parameters  Please see parameters saved in Tablet 1; bilateral steering electrodes    Electrical Stimulation Goals  Strength;Neuromuscular facilitation             PT Education - 08/07/17 1140    Education provided  Yes    Education Details  See self care    Person(s) Educated  Patient    Methods  Explanation    Comprehension  Verbalized understanding       PT Short Term Goals - 07/25/17 2310      PT SHORT TERM GOAL #1   Title  Pt wil participate in and receive appropriate AFO for home/community ambulation    Baseline  07/24/17: pt agreeable to AFO, waiting on consult    Status  Partially Met      PT SHORT TERM GOAL #2   Title  Pt will ambulate 230' on level surfaces with cane and appropriate AFO with supervision    Baseline  07/14/17: working with cane, not to this distance. awaiting an AFO consult as well    Status  Not Met      PT SHORT TERM GOAL #5   Title  Pt will improve standing balance as indicated by increase in BERG balance score to > or = 50/56    Baseline  07/24/17: 35/56 scored today, decreased from last assessment    Status  Not Met 44/56      PT SHORT TERM GOAL #6   Title  Pt will improve safety with gait as indicated by increase in gait velocity to >/= 2.51f/sec with cane and AFO    Baseline  07/24/17: not checked, to be assessed next visit    Time  4    Period  Weeks    Status  On-going Pending AFO        PT Long Term Goals -  06/24/17 2114      PT LONG TERM GOAL #1   Title  Will be independent with HEP with standing HEP    Time  8    Period  Weeks    Status  New  Target Date  08/23/17      PT LONG TERM GOAL #2   Title  Pt will ambulate x 250' outside on pavement and up/down curb with cane, AFO and min A    Time  8    Period  Weeks    Status  New    Target Date  08/23/17      PT LONG TERM GOAL #5   Title  Pt will decrease falls risk as indicated by increase in BERG score to > or = 50/56    Baseline  46/56    Time  8    Period  Weeks    Status  Revised    Target Date  08/23/17      PT LONG TERM GOAL #7   Title  Pt will improve gait velocity to > or = 2.8 ft/sec with cane and appropriate AFO     Time  8    Period  Weeks    Status  Revised    Target Date  08/23/17            Plan - 08/07/17 1141    Clinical Impression Statement  Initiated session with discussion regarding questions from last session about use of BIONESS and rationale for AFO assessment.  See self care for full details but following discussion pt requested to continue use of BIONESS training and was agreeable to AFO assessment.  Discussed recent falls and safety recommendations.  Re-administered BIONESS on bilat lower leg and performed gait assessment with bilat ankle DF assistance.  Continues to present with hamstring fatigue and foot drag during prolonged gait.  Discussed use of Bioness at home; pt agreeable; will initiate paperwork for home unit.  Will continue to progress towards LTG.    Rehab Potential  Good    Clinical Impairments Affecting Rehab Potential  MS     PT Frequency  2x / week    PT Duration  8 weeks    PT Treatment/Interventions  ADLs/Self Care Home Management;Cryotherapy;Vasopneumatic Device;Taping;Gait training;Stair training;Functional mobility training;Therapeutic activities;Therapeutic exercise;Balance training;Neuromuscular re-education;Passive range of motion;Manual techniques;Patient/family  education;Electrical Stimulation;DME Instruction;Orthotic Fit/Training;Energy conservation    PT Next Visit Plan  Have pt sign Bioness paperwork!  has order for AFO come in?  return to use of Bioness bilat lower cuff, R hamstring.  Continue hamstring strengthening, ankle strengthening, SLS, rockerboard, stepping strategy, balance with decreased UE support.  Treadmill with Bioness.      PT Home Exercise Plan  --    Recommended Other Services  AFO assessment; Bioness for home    Consulted and Agree with Plan of Care  Patient       Patient will benefit from skilled therapeutic intervention in order to improve the following deficits and impairments:  Abnormal gait, Pain, Decreased range of motion, Decreased balance, Decreased mobility, Difficulty walking, Decreased activity tolerance, Decreased strength, Decreased endurance, Impaired flexibility, Impaired sensation  Visit Diagnosis: Difficulty in walking, not elsewhere classified  Muscle weakness (generalized)  Unsteadiness on feet     Problem List Patient Active Problem List   Diagnosis Date Noted  . Vitamin D insufficiency 11/29/2016  . Hypogonadism in male 11/01/2016  . Type 1 diabetes mellitus ([place])   . Hypothyroidism   . Hypertension   . Hyperlipidemia   . Closed fracture of right fibula and tibia 10/29/2016  . Closed tibia fracture 10/29/2016  . Closed fracture of right tibial plafond with fibula involvement 10/29/2016  . Abnormal liver function tests 03/06/2013  . RASH AND OTHER  NONSPECIFIC SKIN ERUPTION 10/07/2009  . SHOULDER PAIN, LEFT 12/27/2008  . Type I (juvenile type) diabetes mellitus without mention of complication, uncontrolled 12/03/2007  . HYPOTHYROIDISM 11/12/2007  . HYPERCHOLESTEROLEMIA 11/12/2007  . MULTIPLE SCLEROSIS 08/29/2007  . PROLIFERATIVE DIABETIC RETINOPATHY 08/29/2007  . HYPERTENSION 12/25/2006    [name], PT, DPT 08/07/17    12:59 PM    [facility] [address] [place], [place], [address] Phone: [phone]   Fax:  [phone]  Name: KOFI MURRELL MRN: [id number] Date of Birth: 1971/02/23

## 2017-08-07 NOTE — Telephone Encounter (Signed)
I do not see anywhere in your last office note that he is to be starting this- will this be the 14 day Libre and should I order this please advise

## 2017-08-07 NOTE — Telephone Encounter (Signed)
Left mess for patient to call back to advise of below.

## 2017-08-07 NOTE — Telephone Encounter (Signed)
He has to ask his DME supplier to send this on a fax to [place]

## 2017-08-08 ENCOUNTER — Other Ambulatory Visit: Payer: Self-pay | Admitting: Orthopedic Surgery

## 2017-08-08 ENCOUNTER — Telehealth: Payer: Self-pay | Admitting: Physical Therapy

## 2017-08-08 DIAGNOSIS — M21372 Foot drop, left foot: Principal | ICD-10-CM

## 2017-08-08 DIAGNOSIS — M21371 Foot drop, right foot: Secondary | ICD-10-CM

## 2017-08-08 NOTE — Telephone Encounter (Signed)
Hello [name], I just wanted to follow up on the request sent by [name], PTA regarding a DME order for bilateral AFO due to ongoing foot drop and ankle instability.  I have copied the original message below.  If you agree with this request, please enter an order into Epic for R and L AFO.    Thank you and please let me know if you have any questions.  [name], PT, DPT 08/08/17    11:11 AM   Original message: Johnathan Ross is being treated by physical therapy for gait and balance issues post right ankle surgery.  He will benefit from use of bilateral AFO's in order to improve safety with functional mobility due to right ankle/LE weakness from surgery and MS. His left foot catches at times as well and may benefit from an AFO as well.   If you agree, please submit request in EPIC under referral for DME (list right and left AFO in comments) or fax to [facility] at [phone].

## 2017-08-12 ENCOUNTER — Encounter: Payer: Self-pay | Admitting: Physical Therapy

## 2017-08-12 ENCOUNTER — Ambulatory Visit: Payer: Medicare Other | Admitting: Physical Therapy

## 2017-08-12 DIAGNOSIS — M6281 Muscle weakness (generalized): Secondary | ICD-10-CM | POA: Diagnosis not present

## 2017-08-12 DIAGNOSIS — M25671 Stiffness of right ankle, not elsewhere classified: Secondary | ICD-10-CM | POA: Diagnosis not present

## 2017-08-12 DIAGNOSIS — M21372 Foot drop, left foot: Secondary | ICD-10-CM | POA: Diagnosis not present

## 2017-08-12 DIAGNOSIS — R2681 Unsteadiness on feet: Secondary | ICD-10-CM

## 2017-08-12 DIAGNOSIS — M21371 Foot drop, right foot: Secondary | ICD-10-CM | POA: Diagnosis not present

## 2017-08-12 DIAGNOSIS — R262 Difficulty in walking, not elsewhere classified: Secondary | ICD-10-CM | POA: Diagnosis not present

## 2017-08-12 NOTE — Therapy (Signed)
[facility] [address] [place], [place], [address] Phone: [phone]   Fax:  [phone]  Physical Therapy Treatment  Patient Details  Name: Johnathan Ross MRN: [id number] Date of Birth: August 04, 1970 Referring Provider: Altamese Scissors, MD   Encounter Date: 08/12/2017  PT End of Session - 08/12/17 1332    Visit Number  55    Number of Visits  64    Date for PT Re-Evaluation  08/23/17    Authorization Type  Medicare    PT Start Time  0846    PT Stop Time  0936    PT Time Calculation (min)  50 min    Equipment Utilized During Treatment  Other (comment) Bioness    Activity Tolerance  Patient tolerated treatment well    Behavior During Therapy  [facility] for tasks assessed/performed not agitated, but frustrated       Past Medical History:  Diagnosis Date  . Hyperlipidemia   . Hypertension   . Hypogonadism in male 11/01/2016  . Hypothyroidism   . Multiple sclerosis ([place])   . 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 Type 1 diabetes mellitus ([place]) dx'd 1994  . Vitamin D insufficiency 11/29/2016    Past Surgical History:  Procedure Laterality Date  . EYE SURGERY Bilateral    "laser for diabetic retinopathy"  . FRACTURE SURGERY    . OPEN REDUCTION INTERNAL FIXATION (ORIF) TIBIA/FIBULA FRACTURE Right 10/30/2016  . ORIF ANKLE FRACTURE Left 2015  . ORIF TIBIA FRACTURE Right 10/30/2016   Procedure: OPEN REDUCTION INTERNAL FIXATION (ORIF) TIBIA FIBULA FRACTURE;  Surgeon: Altamese [place], MD;  Location: [facility];  Service: Orthopedics;  Laterality: Right;  . RETINAL LASER PROCEDURE Bilateral    "for diabetic retinopathy"    There were no vitals filed for this visit.  Subjective Assessment - 08/12/17 1328    Subjective  Increased LE fatigue; required increased rest breaks when ambulating back to treatment gym.  No issues to report.    Patient is accompained by:  Family member    Pertinent History  MS dx about 6 years ago     How long can you sit comfortably?  unlimited    How long can you stand comfortably?  unlimited    How long can you walk comfortably?  household distances due to LE fatigue/weakness    Diagnostic tests  X-ray    Patient Stated Goals  Walk without AD;  On 06/17/17 patient states "for me, coming off of that (RW) is how to measure improvement".     Currently in Pain?  No/denies                      [facility] Adult PT Treatment/Exercise - 08/12/17 1328      Self-Care   Self-Care  Other Self-Care Comments    Other Self-Care Comments   discussed orthotics consult next Monday and reviewed paperwork for home Bioness unit.        Knee/Hip Exercises: Aerobic   Tread Mill  0.6 x 7 minutes total but with standing rest break x 3 due to LE fatigue and increased foot drag on treadmill while wearing Bioness bilaterally and therapist providing facilitation to decrease genu recurvatum and increase knee flexion during swing phase for increased step length, foot clearance and heel strike      Modalities   Modalities  Electrical Stimulation      Electrical Stimulation   Electrical Stimulation Location  bilat anterior tibialis and R hamstring  Electrical Stimulation Action  closed and open chain ankle DF, knee flexion and hip extension    Electrical Stimulation Parameters  See parameters saved in Tablet 1; steering electrodes for bilat lower cuff    Electrical Stimulation Goals  Strength;Neuromuscular facilitation             PT Education - 08/12/17 1332    Education provided  Yes    Education Details  orthotics consult and Bioness paperwork    Person(s) Educated  Patient;Parent(s)    Methods  Explanation    Comprehension  Verbalized understanding       PT Short Term Goals - 07/25/17 2310      PT SHORT TERM GOAL #1   Title  Pt wil participate in and receive appropriate AFO for home/community ambulation    Baseline  07/24/17: pt agreeable to AFO, waiting on consult    Status   Partially Met      PT SHORT TERM GOAL #2   Title  Pt will ambulate 230' on level surfaces with cane and appropriate AFO with supervision    Baseline  07/14/17: working with cane, not to this distance. awaiting an AFO consult as well    Status  Not Met      PT SHORT TERM GOAL #5   Title  Pt will improve standing balance as indicated by increase in BERG balance score to > or = 50/56    Baseline  07/24/17: 35/56 scored today, decreased from last assessment    Status  Not Met 44/56      PT SHORT TERM GOAL #6   Title  Pt will improve safety with gait as indicated by increase in gait velocity to >/= 2.11f/sec with cane and AFO    Baseline  07/24/17: not checked, to be assessed next visit    Time  4    Period  Weeks    Status  On-going Pending AFO        PT Long Term Goals - 06/24/17 2114      PT LONG TERM GOAL #1   Title  Will be independent with HEP with standing HEP    Time  8    Period  Weeks    Status  New    Target Date  08/23/17      PT LONG TERM GOAL #2   Title  Pt will ambulate x 250' outside on pavement and up/down curb with cane, AFO and min A    Time  8    Period  Weeks    Status  New    Target Date  08/23/17      PT LONG TERM GOAL #5   Title  Pt will decrease falls risk as indicated by increase in BERG score to > or = 50/56    Baseline  46/56    Time  8    Period  Weeks    Status  Revised    Target Date  08/23/17      PT LONG TERM GOAL #7   Title  Pt will improve gait velocity to > or = 2.8 ft/sec with cane and appropriate AFO     Time  8    Period  Weeks    Status  Revised    Target Date  08/23/17            Plan - 08/12/17 1333    Clinical Impression Statement  Reviewed orthotics consult appointment and reinforced rationale for consult with pt and  mother; also reviewed Bioness paperwork and obtained pt consent for release of information to Bioness - also discussed process of submitting paperwork and awaiting review by Bioness and insurance for  approval.  Continued Bioness training with lower cuff on bilat LE and hamstring thigh cuff on RLE during treadmill gait training.  Pt tolerated well but continues to require manual facilitation to decrease bilat genu recurvatum and multiple rest breaks due to fatigue.  Will continue to progress towards LTG.    Rehab Potential  Good    Clinical Impairments Affecting Rehab Potential  MS     PT Frequency  2x / week    PT Duration  8 weeks    PT Treatment/Interventions  ADLs/Self Care Home Management;Cryotherapy;Vasopneumatic Device;Taping;Gait training;Stair training;Functional mobility training;Therapeutic activities;Therapeutic exercise;Balance training;Neuromuscular re-education;Passive range of motion;Manual techniques;Patient/family education;Electrical Stimulation;DME Instruction;Orthotic Fit/Training;Energy conservation    PT Next Visit Plan   has order for AFO come in?  return to use of Bioness bilat lower cuff, R hamstring.  Continue hamstring strengthening, ankle strengthening, SLS, rockerboard, stepping strategy, balance with decreased UE support.  Treadmill with Bioness.      Recommended Other Services  AFO consult is Monday 3/25 at 845    Consulted and Agree with Plan of Care  Patient       Patient will benefit from skilled therapeutic intervention in order to improve the following deficits and impairments:  Abnormal gait, Pain, Decreased range of motion, Decreased balance, Decreased mobility, Difficulty walking, Decreased activity tolerance, Decreased strength, Decreased endurance, Impaired flexibility, Impaired sensation  Visit Diagnosis: Difficulty in walking, not elsewhere classified  Muscle weakness (generalized)  Unsteadiness on feet     Problem List Patient Active Problem List   Diagnosis Date Noted  . Vitamin D insufficiency 11/29/2016  . Hypogonadism in male 11/01/2016  . Type 1 diabetes mellitus ([place])   . Hypothyroidism   . Hypertension   . Hyperlipidemia   .  Closed fracture of right fibula and tibia 10/29/2016  . Closed tibia fracture 10/29/2016  . Closed fracture of right tibial plafond with fibula involvement 10/29/2016  . Abnormal liver function tests 03/06/2013  . RASH AND OTHER NONSPECIFIC SKIN ERUPTION 10/07/2009  . SHOULDER PAIN, LEFT 12/27/2008  . Type I (juvenile type) diabetes mellitus without mention of complication, uncontrolled 12/03/2007  . HYPOTHYROIDISM 11/12/2007  . HYPERCHOLESTEROLEMIA 11/12/2007  . MULTIPLE SCLEROSIS 08/29/2007  . PROLIFERATIVE DIABETIC RETINOPATHY 08/29/2007  . HYPERTENSION 12/25/2006   [name], PT, DPT 08/12/17    1:40 PM    [facility] [address] [place], [place], [address] Phone: [phone]   Fax:  [phone]  Name: Johnathan Ross MRN: [id number] Date of Birth: 1970-11-01

## 2017-08-14 ENCOUNTER — Encounter: Payer: Self-pay | Admitting: Physical Therapy

## 2017-08-14 ENCOUNTER — Ambulatory Visit: Payer: Medicare Other | Admitting: Physical Therapy

## 2017-08-14 DIAGNOSIS — M6281 Muscle weakness (generalized): Secondary | ICD-10-CM | POA: Diagnosis not present

## 2017-08-14 DIAGNOSIS — R2681 Unsteadiness on feet: Secondary | ICD-10-CM

## 2017-08-14 DIAGNOSIS — R262 Difficulty in walking, not elsewhere classified: Secondary | ICD-10-CM | POA: Diagnosis not present

## 2017-08-14 DIAGNOSIS — M21371 Foot drop, right foot: Secondary | ICD-10-CM | POA: Diagnosis not present

## 2017-08-14 DIAGNOSIS — M25671 Stiffness of right ankle, not elsewhere classified: Secondary | ICD-10-CM | POA: Diagnosis not present

## 2017-08-14 DIAGNOSIS — M21372 Foot drop, left foot: Secondary | ICD-10-CM | POA: Diagnosis not present

## 2017-08-14 NOTE — Therapy (Signed)
[facility] [address] [place], [place], [address] Phone: [phone]   Fax:  [phone]  Physical Therapy Treatment  Patient Details  Name: Johnathan Ross MRN: [id number] Date of Birth: 1971-02-17 Referring Provider: Altamese Sasser, MD   Encounter Date: 08/14/2017  PT End of Session - 08/14/17 1135    Visit Number  30    Number of Visits  39    Date for PT Re-Evaluation  08/23/17    Authorization Type  Medicare    PT Start Time  0840    PT Stop Time  0930    PT Time Calculation (min)  50 min    Equipment Utilized During Treatment  Other (comment);Gait belt    Activity Tolerance  Patient tolerated treatment well    Behavior During Therapy  [facility] for tasks assessed/performed       Past Medical History:  Diagnosis Date  . Hyperlipidemia   . Hypertension   . Hypogonadism in male 11/01/2016  . Hypothyroidism   . Multiple sclerosis ([place])   . 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 Type 1 diabetes mellitus ([place]) dx'd 1994  . Vitamin D insufficiency 11/29/2016    Past Surgical History:  Procedure Laterality Date  . EYE SURGERY Bilateral    "laser for diabetic retinopathy"  . FRACTURE SURGERY    . OPEN REDUCTION INTERNAL FIXATION (ORIF) TIBIA/FIBULA FRACTURE Right 10/30/2016  . ORIF ANKLE FRACTURE Left 2015  . ORIF TIBIA FRACTURE Right 10/30/2016   Procedure: OPEN REDUCTION INTERNAL FIXATION (ORIF) TIBIA FIBULA FRACTURE;  Surgeon: Altamese [place], MD;  Location: [facility];  Service: Orthopedics;  Laterality: Right;  . RETINAL LASER PROCEDURE Bilateral    "for diabetic retinopathy"    There were no vitals filed for this visit.  Subjective Assessment - 08/14/17 0904    Subjective  Pt reports no pain, no medication changes, and no falls. Pt states "I enjoyed using BIoness last time, I think it helps."      Patient is accompained by:  Family member    Pertinent History  MS dx about 6 years ago    How long can  you sit comfortably?  unlimited    How long can you stand comfortably?  unlimited    How long can you walk comfortably?  household distances due to LE fatigue/weakness    Diagnostic tests  X-ray    Patient Stated Goals  Walk without AD;  On 06/17/17 patient states "for me, coming off of that (RW) is how to measure improvement".     Currently in Pain?  No/denies    Multiple Pain Sites  No        [facility] Adult PT Treatment/Exercise - 08/14/17 1111      Transfers   Transfers  Sit to Stand;Stand to Sit    Sit to Stand  5: Supervision;With upper extremity assist    Stand to Sit  5: Supervision;With upper extremity assist    Comments  x 5 reps during sitting rest breaks.       Ambulation/Gait   Ambulation/Gait  Yes    Ambulation/Gait Assistance  4: Min guard    Ambulation/Gait Assistance Details  Used Bioness during gait ambulation with RW. Pt required x 5 standing rest breaks during 345 feet gait distance due to bilateral LE fatigue. Pt also required cues to attempt to avoid the right foot drag by performing additional hip flexion during swing phase; additonal cues for wider BOS due to narrowed gait especially during  turns.     Ambulation Distance (Feet)  350 Feet plus 70 feet x 2 during clinic.     Assistive device  Rolling walker    Gait Pattern  Step-through pattern;Decreased hip/knee flexion - right;Poor foot clearance - right    Ambulation Surface  Level;Indoor    Stairs  Yes    Stairs Assistance  5: Supervision;6: Modified independent (Device/Increase time)    Stairs Assistance Details (indicate cue type and reason)  Pt reciprocal pattern ascending x 6 reps, descending x 3 reps, x 3 reps step to pattern due to increased fatigue. Bioness was on during task.     Stair Management Technique  Two rails;Forwards;Step to pattern;Alternating pattern    Number of Stairs  4    Height of Stairs  6    Door Management  --      High Level Balance   High Level Balance Activities  Side  stepping;Backward walking;Marching forwards    High Level Balance Comments  Performed task in parallel bars with BUE support for upright posture and balance. High marching forwards, side stepping x 5 laps each. Provided verbal, demo, and additional weight shifting anteriorly due to tendency to extend posteriorly during marches.       Modalities   Modalities  Teacher, English as a foreign language Location  bilat anterior tibialis and R hamstring    Electrical Stimulation Action  anterior tib neuro re-ed, strengthening and DF assist with swing phase of gait    Electrical Stimulation Parameters  All settings saved to Bioness tablet 1; steering electrodes with bil lower cuffs    Electrical Stimulation Goals  Strength;Neuromuscular facilitation             PT Short Term Goals - 07/25/17 2310      PT SHORT TERM GOAL #1   Title  Pt wil participate in and receive appropriate AFO for home/community ambulation    Baseline  07/24/17: pt agreeable to AFO, waiting on consult    Status  Partially Met      PT SHORT TERM GOAL #2   Title  Pt will ambulate 230' on level surfaces with cane and appropriate AFO with supervision    Baseline  07/14/17: working with cane, not to this distance. awaiting an AFO consult as well    Status  Not Met      PT SHORT TERM GOAL #5   Title  Pt will improve standing balance as indicated by increase in BERG balance score to > or = 50/56    Baseline  07/24/17: 35/56 scored today, decreased from last assessment    Status  Not Met 44/56      PT SHORT TERM GOAL #6   Title  Pt will improve safety with gait as indicated by increase in gait velocity to >/= 2.69f/sec with cane and AFO    Baseline  07/24/17: not checked, to be assessed next visit    Time  4    Period  Weeks    Status  On-going Pending AFO        PT Long Term Goals - 06/24/17 2114      PT LONG TERM GOAL #1   Title  Will be independent with HEP with standing HEP     Time  8    Period  Weeks    Status  New    Target Date  08/23/17      PT LONG TERM GOAL #2  Title  Pt will ambulate x 250' outside on pavement and up/down curb with cane, AFO and min A    Time  8    Period  Weeks    Status  New    Target Date  08/23/17      PT LONG TERM GOAL #5   Title  Pt will decrease falls risk as indicated by increase in BERG score to > or = 50/56    Baseline  46/56    Time  8    Period  Weeks    Status  Revised    Target Date  08/23/17      PT LONG TERM GOAL #7   Title  Pt will improve gait velocity to > or = 2.8 ft/sec with cane and appropriate AFO     Time  8    Period  Weeks    Status  Revised    Target Date  08/23/17        Plan - 08/14/17 1137    Clinical Impression Statement  Today's session focused on continued use of Bioness to perform increased gait distance with use of RW, perform stair training, and dynamic walking for bilateral lower extremity strengthening. Pt tolerated increased activities well but did require multiple standing rest breaks due to increased fatigue. When fatigue was a factor, gait quality was decreased by pt exhibiting right foot drag, bilateral genu recurvatum, and narrowed BOS. Pt is making slow progress towards goals and would benefit towards continued Pt interventions towards unmet PT goals.     Rehab Potential  Good    Clinical Impairments Affecting Rehab Potential  MS     PT Frequency  2x / week    PT Duration  8 weeks    PT Treatment/Interventions  ADLs/Self Care Home Management;Cryotherapy;Vasopneumatic Device;Taping;Gait training;Stair training;Functional mobility training;Therapeutic activities;Therapeutic exercise;Balance training;Neuromuscular re-education;Passive range of motion;Manual techniques;Patient/family education;Electrical Stimulation;DME Instruction;Orthotic Fit/Training;Energy conservation    PT Next Visit Plan   has order for AFO come in?  Continue with  use of Bioness bilat lower cuff, R hamstring.   Continue hamstring strengthening, ankle strengthening, SLS, rockerboard, stepping strategy, balance with decreased UE support.  Treadmill with Bioness.      PT Home Exercise Plan  Pt has program at home for strengthening and balance    Recommended Other Services  AFo consult is Monday 3/25 at 20    Consulted and Agree with Plan of Care  Patient    Family Member Consulted  mom       Patient will benefit from skilled therapeutic intervention in order to improve the following deficits and impairments:  Abnormal gait, Pain, Decreased range of motion, Decreased balance, Decreased mobility, Difficulty walking, Decreased activity tolerance, Decreased strength, Decreased endurance, Impaired flexibility, Impaired sensation  Visit Diagnosis: Difficulty in walking, not elsewhere classified  Muscle weakness (generalized)  Unsteadiness on feet  Stiffness of right ankle, not elsewhere classified     Problem List Patient Active Problem List   Diagnosis Date Noted  . Vitamin D insufficiency 11/29/2016  . Hypogonadism in male 11/01/2016  . Type 1 diabetes mellitus ([place])   . Hypothyroidism   . Hypertension   . Hyperlipidemia   . Closed fracture of right fibula and tibia 10/29/2016  . Closed tibia fracture 10/29/2016  . Closed fracture of right tibial plafond with fibula involvement 10/29/2016  . Abnormal liver function tests 03/06/2013  . RASH AND OTHER NONSPECIFIC SKIN ERUPTION 10/07/2009  . SHOULDER PAIN, LEFT 12/27/2008  . Type I (  juvenile type) diabetes mellitus without mention of complication, uncontrolled 12/03/2007  . HYPOTHYROIDISM 11/12/2007  . HYPERCHOLESTEROLEMIA 11/12/2007  . MULTIPLE SCLEROSIS 08/29/2007  . PROLIFERATIVE DIABETIC RETINOPATHY 08/29/2007  . HYPERTENSION 12/25/2006    Carlena Sax, SPTA 08/14/2017, 11:45 AM  [facility] [address] [place], [place], [address] Phone: [phone]   Fax:   [phone]  Name: AASHISH HAMM MRN: [id number] Date of Birth: [birth year]  This note has been reviewed and edited by supervising CI.  Willow Ora, PTA, [facility] [address] [place],  [address] [phone] 08/14/17, 2:39 PM

## 2017-08-19 ENCOUNTER — Ambulatory Visit: Payer: Medicare Other | Admitting: Physical Therapy

## 2017-08-19 ENCOUNTER — Encounter: Payer: Self-pay | Admitting: Physical Therapy

## 2017-08-19 DIAGNOSIS — R262 Difficulty in walking, not elsewhere classified: Secondary | ICD-10-CM | POA: Diagnosis not present

## 2017-08-19 DIAGNOSIS — M6281 Muscle weakness (generalized): Secondary | ICD-10-CM | POA: Diagnosis not present

## 2017-08-19 DIAGNOSIS — R2681 Unsteadiness on feet: Secondary | ICD-10-CM

## 2017-08-19 DIAGNOSIS — M21371 Foot drop, right foot: Secondary | ICD-10-CM | POA: Diagnosis not present

## 2017-08-19 DIAGNOSIS — M25671 Stiffness of right ankle, not elsewhere classified: Secondary | ICD-10-CM | POA: Diagnosis not present

## 2017-08-19 DIAGNOSIS — M21372 Foot drop, left foot: Secondary | ICD-10-CM | POA: Diagnosis not present

## 2017-08-19 NOTE — Therapy (Signed)
[facility] [address] [place], [place], [address] Phone: [phone]   Fax:  [phone]  Physical Therapy Treatment  Patient Details  Name: Johnathan Ross MRN: [id number] Date of Birth: [birth year] Referring Provider: Altamese Edison, MD   Encounter Date: 08/19/2017  PT End of Session - 08/19/17 1047    Visit Number  62    Number of Visits  62    Date for PT Re-Evaluation  08/23/17    Authorization Type  Medicare    PT Start Time  0847    PT Stop Time  0934    PT Time Calculation (min)  47 min    Equipment Utilized During Treatment  Gait belt    Activity Tolerance  Patient tolerated treatment well    Behavior During Therapy  [facility] for tasks assessed/performed       Past Medical History:  Diagnosis Date  . Hyperlipidemia   . Hypertension   . Hypogonadism in male 11/01/2016  . Hypothyroidism   . Multiple sclerosis ([place])   . 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 Type 1 diabetes mellitus ([employer]) dx'd 1994  . Vitamin D insufficiency 11/29/2016    Past Surgical History:  Procedure Laterality Date  . EYE SURGERY Bilateral    "laser for diabetic retinopathy"  . FRACTURE SURGERY    . OPEN REDUCTION INTERNAL FIXATION (ORIF) TIBIA/FIBULA FRACTURE Right 10/30/2016  . ORIF ANKLE FRACTURE Left 2015  . ORIF TIBIA FRACTURE Right 10/30/2016   Procedure: OPEN REDUCTION INTERNAL FIXATION (ORIF) TIBIA FIBULA FRACTURE;  Surgeon: Altamese [place], MD;  Location: [facility];  Service: Orthopedics;  Laterality: Right;  . RETINAL LASER PROCEDURE Bilateral    "for diabetic retinopathy"    There were no vitals filed for this visit.  Subjective Assessment - 08/19/17 0848    Subjective  No issues to report; did not need a rest break when ambulating back to treatment area today.  Received a call from [facility] to verify information.    Patient is accompained by:  Family member    Pertinent History  MS dx about 6 years ago    How long  can you sit comfortably?  unlimited    How long can you stand comfortably?  unlimited    How long can you walk comfortably?  household distances due to LE fatigue/weakness    Diagnostic tests  X-ray    Patient Stated Goals  Walk without AD;  On 06/17/17 patient states "for me, coming off of that (RW) is how to measure improvement".     Currently in Pain?  No/denies         [facility] PT Assessment - 08/19/17 0849      Standardized Balance Assessment   Standardized Balance Assessment  Berg Balance Test;10 meter walk test      Berg Balance Test   Sit to Stand  Able to stand without using hands and stabilize independently    Standing Unsupported  Able to stand safely 2 minutes    Sitting with Back Unsupported but Feet Supported on Floor or Stool  Able to sit safely and securely 2 minutes    Stand to Sit  Sits safely with minimal use of hands    Transfers  Able to transfer safely, minor use of hands    Standing Unsupported with Eyes Closed  Able to stand 10 seconds with supervision    Standing Ubsupported with Feet Together  Able to place feet together independently and stand for 1 minute  with supervision    From Standing, Reach Forward with Outstretched Arm  Can reach confidently >25 cm (10")    From Standing Position, Pick up Object from [place] to pick up shoe safely and easily    From Standing Position, Turn to Look Behind Over each Shoulder  Needs supervision when turning    Turn 360 Degrees  Needs close supervision or verbal cueing    Standing Unsupported, Alternately Place Feet on Step/Stool  Able to complete 4 steps without aid or supervision    Standing Unsupported, One Foot in [place] to plae foot ahead of the other independently and hold 30 seconds    Standing on One Leg  Able to lift leg independently and hold 5-10 seconds    Total Score  44    Berg comment:  44/56 same as at 4 weeks     Patient demonstrates increased fall risk as noted by score of  44/56 on Berg Balance  Scale.  (<36= high risk for falls, close to 100%; 37-45 significant >80%; 46-51 moderate >50%; 52-55 lower >25%)        No data recorded       [facility] Adult PT Treatment/Exercise - 08/19/17 0849      Ambulation/Gait   Ambulation/Gait  Yes    Ambulation/Gait Assistance  4: Min assist    Ambulation/Gait Assistance Details  for orthotic assessment: first lap with RW and no AFO; second lap with cane and no AFO to allow orthotist to observe RLE weakness and mechanics during gait including RLE foot drop, toe drag and genu recurvatum limiting efficiency and safety with gait.      Ambulation Distance (Feet)  350 Feet    Assistive device  Rolling walker;Straight cane    Gait Comments  Discussed limitations of current R brace and need for stiffer Carbon AFO that comes up higher on lower leg to assist with control of genu recurvatum (pt over powers [place] PLS and reaction brace) and to improve foot clearance and safety/efficiency of gait.  Also discussed possibility of being able to progress off of RW to cane with use of AFO due to improved stability and safety (pt is self conscious about using an AFO in public) and also discussed the progressive nature of MS and need for bracing if weakness progresses.             PT Education - 08/19/17 1046    Education provided  Yes    Education Details  rationale for AFO recommended, will need to f/u at orthotist office to trial AFO, BERG outcome    Person(s) Educated  Patient;Parent(s)    Methods  Explanation    Comprehension  Verbalized understanding       PT Short Term Goals - 07/25/17 2310      PT SHORT TERM GOAL #1   Title  Pt wil participate in and receive appropriate AFO for home/community ambulation    Baseline  07/24/17: pt agreeable to AFO, waiting on consult    Status  Partially Met      PT SHORT TERM GOAL #2   Title  Pt will ambulate 230' on level surfaces with cane and appropriate AFO with supervision    Baseline  07/14/17:  working with cane, not to this distance. awaiting an AFO consult as well    Status  Not Met      PT SHORT TERM GOAL #5   Title  Pt will improve standing balance as indicated by  increase in BERG balance score to > or = 50/56    Baseline  07/24/17: 35/56 scored today, decreased from last assessment    Status  Not Met 44/56      PT SHORT TERM GOAL #6   Title  Pt will improve safety with gait as indicated by increase in gait velocity to >/= 2.36f/sec with cane and AFO    Baseline  07/24/17: not checked, to be assessed next visit    Time  4    Period  Weeks    Status  On-going Pending AFO        PT Long Term Goals - 08/19/17 1052      PT LONG TERM GOAL #1   Title  Will be independent with HEP with standing HEP    Time  8    Period  Weeks    Status  On-going    Target Date  08/23/17      PT LONG TERM GOAL #2   Title  Pt will ambulate x 250' outside on pavement and up/down curb with cane, AFO and min A    Time  8    Period  Weeks    Status  On-going    Target Date  08/23/17      PT LONG TERM GOAL #5   Title  Pt will decrease falls risk as indicated by increase in BERG score to > or = 50/56    Baseline  44/56    Time  8    Period  Weeks    Status  Not Met      PT LONG TERM GOAL #7   Title  Pt will improve gait velocity to > or = 2.8 ft/sec with cane and appropriate AFO     Time  8    Period  Weeks    Status  On-going    Target Date  08/23/17            Plan - 08/19/17 1048    Clinical Impression Statement  Pt participated in re-assessment of standing balance with BERG balance assessment (re-assessment of LTG for recertification at next visit) with balance score remaining the same as 4 week: 44/56 with improvement in SLS and taps to step but decreased stability with turning and pivoting today.  Pt also participated in orthotics assessment with orthotist who is recommending SpryStep carbon AFO due to increased stiffness of AFO to control recurvatum and provide DF assist  for increased foot clearance and safety.  Pt would like to trial AFO before making his decision; [facility]  Discussed adding visits to continue to work on safety of gait with AFO and to continue to progress towards patient's goal of ambulating without the RW.  Pt agreeable.    Rehab Potential  Good    Clinical Impairments Affecting Rehab Potential  MS     PT Frequency  2x / week    PT Duration  8 weeks    PT Treatment/Interventions  ADLs/Self Care Home Management;Cryotherapy;Vasopneumatic Device;Taping;Gait training;Stair training;Functional mobility training;Therapeutic activities;Therapeutic exercise;Balance training;Neuromuscular re-education;Passive range of motion;Manual techniques;Patient/family education;Electrical Stimulation;DME Instruction;Orthotic Fit/Training;Energy conservation    PT Next Visit Plan  finish checking LTG; recert to work with new AFO/Bioness.   has order for AFO come in?  return to use of Bioness bilat lower cuff, R hamstring.  Continue hamstring strengthening, ankle strengthening, SLS, rockerboard, stepping strategy, balance with decreased UE support.  Treadmill with Bioness.  PT Home Exercise Plan  Pt has program at home for strengthening and balance    Consulted and Agree with Plan of Care  Patient    Family Member Consulted  mom       Patient will benefit from skilled therapeutic intervention in order to improve the following deficits and impairments:  Abnormal gait, Pain, Decreased range of motion, Decreased balance, Decreased mobility, Difficulty walking, Decreased activity tolerance, Decreased strength, Decreased endurance, Impaired flexibility, Impaired sensation  Visit Diagnosis: Difficulty in walking, not elsewhere classified  Muscle weakness (generalized)  Unsteadiness on feet     Problem List Patient Active Problem List   Diagnosis Date Noted  . Vitamin D insufficiency 11/29/2016  . Hypogonadism in  male 11/01/2016  . Type 1 diabetes mellitus ([place])   . Hypothyroidism   . Hypertension   . Hyperlipidemia   . Closed fracture of right fibula and tibia 10/29/2016  . Closed tibia fracture 10/29/2016  . Closed fracture of right tibial plafond with fibula involvement 10/29/2016  . Abnormal liver function tests 03/06/2013  . RASH AND OTHER NONSPECIFIC SKIN ERUPTION 10/07/2009  . SHOULDER PAIN, LEFT 12/27/2008  . Type I (juvenile type) diabetes mellitus without mention of complication, uncontrolled 12/03/2007  . HYPOTHYROIDISM 11/12/2007  . HYPERCHOLESTEROLEMIA 11/12/2007  . MULTIPLE SCLEROSIS 08/29/2007  . PROLIFERATIVE DIABETIC RETINOPATHY 08/29/2007  . HYPERTENSION 12/25/2006    Johnathan Ross, PT, DPT 08/19/17    10:54 AM    [facility] [address] [place], [place], [address] Phone: [phone]   Fax:  [phone]  Name: Johnathan Ross MRN: [id number] Date of Birth: Sep 29, 1970

## 2017-08-20 ENCOUNTER — Ambulatory Visit
Admission: RE | Admit: 2017-08-20 | Discharge: 2017-08-20 | Disposition: A | Discharge status: Home / Self Care | Payer: Medicare Other | Source: Ambulatory Visit | Attending: Urology | Admitting: Urology

## 2017-08-20 DIAGNOSIS — G35 Multiple sclerosis: Secondary | ICD-10-CM

## 2017-08-20 MED ORDER — GADOBENATE DIMEGLUMINE 529 MG/ML IV SOLN
20.0000 mL | Freq: Once | INTRAVENOUS | Status: AC | PRN
  Administered 2017-08-20: 20 mL via INTRAVENOUS

## 2017-08-21 ENCOUNTER — Ambulatory Visit: Payer: Medicare Other | Admitting: Physical Therapy

## 2017-08-21 ENCOUNTER — Encounter: Payer: Self-pay | Admitting: Physical Therapy

## 2017-08-21 DIAGNOSIS — M21371 Foot drop, right foot: Secondary | ICD-10-CM | POA: Diagnosis not present

## 2017-08-21 DIAGNOSIS — M21372 Foot drop, left foot: Secondary | ICD-10-CM | POA: Diagnosis not present

## 2017-08-21 DIAGNOSIS — R2681 Unsteadiness on feet: Secondary | ICD-10-CM | POA: Diagnosis not present

## 2017-08-21 DIAGNOSIS — M6281 Muscle weakness (generalized): Secondary | ICD-10-CM | POA: Diagnosis not present

## 2017-08-21 DIAGNOSIS — M25671 Stiffness of right ankle, not elsewhere classified: Secondary | ICD-10-CM | POA: Diagnosis not present

## 2017-08-21 DIAGNOSIS — R262 Difficulty in walking, not elsewhere classified: Secondary | ICD-10-CM | POA: Diagnosis not present

## 2017-08-21 NOTE — Therapy (Signed)
[facility] [address] [place], [place], [address] Phone: [phone]   Fax:  [phone]  Physical Therapy Treatment  Patient Details  Name: Johnathan Ross MRN: [id number] Date of Birth: 03-29-1971 Referring Provider: Altamese Benjamin, MD   Encounter Date: 08/21/2017  PT End of Session - 08/21/17 1314    Visit Number  6    Number of Visits  67    Date for PT Re-Evaluation  08/23/17    Authorization Type  Medicare    PT Start Time  0850    PT Stop Time  0932    PT Time Calculation (min)  42 min    Equipment Utilized During Treatment  Gait belt    Activity Tolerance  Patient tolerated treatment well    Behavior During Therapy  [facility] for tasks assessed/performed       Past Medical History:  Diagnosis Date  . Hyperlipidemia   . Hypertension   . Hypogonadism in male 11/01/2016  . Hypothyroidism   . Multiple sclerosis ([place])   . 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 Type 1 diabetes mellitus ([place]) dx'd 1994  . Vitamin D insufficiency 11/29/2016    Past Surgical History:  Procedure Laterality Date  . EYE SURGERY Bilateral    "laser for diabetic retinopathy"  . FRACTURE SURGERY    . OPEN REDUCTION INTERNAL FIXATION (ORIF) TIBIA/FIBULA FRACTURE Right 10/30/2016  . ORIF ANKLE FRACTURE Left 2015  . ORIF TIBIA FRACTURE Right 10/30/2016   Procedure: OPEN REDUCTION INTERNAL FIXATION (ORIF) TIBIA FIBULA FRACTURE;  Surgeon: Altamese [place], MD;  Location: [facility];  Service: Orthopedics;  Laterality: Right;  . RETINAL LASER PROCEDURE Bilateral    "for diabetic retinopathy"    There were no vitals filed for this visit.  Subjective Assessment - 08/21/17 0853    Subjective  No questions about orthosis or recommendations; [facility] has not called yet to schedule an appointment with pt.  Has a brand new RW - yesterday at MRI the MRI magnet pulled his RW from his hands causing him to fall and crushing his RW in the MRI.  No  injury from the fall except a scrape on his RUE.  His new RW has posterior rubber tips and it catches on the floor more.      Patient is accompained by:  Family member    Pertinent History  MS dx about 6 years ago    How long can you sit comfortably?  unlimited    How long can you stand comfortably?  unlimited    How long can you walk comfortably?  household distances due to LE fatigue/weakness    Diagnostic tests  X-ray    Patient Stated Goals  Walk without AD;  On 06/17/17 patient states "for me, coming off of that (RW) is how to measure improvement".     Currently in Pain?  No/denies         [facility] PT Assessment - 08/21/17 0858      Assessment   Medical Diagnosis  R tibia/fibula fx, s/p ORIF    Referring Provider  Altamese [place], MD    Onset Date/Surgical Date  10/30/16    Hand Dominance  Right    Prior Therapy  yes, for past LE injury      Precautions   Precautions  Fall      Prior Function   Level of Independence  Independent      Observation/Other Assessments   Focus on Therapeutic Outcomes (FOTO)  Deferred-not indicated for MS diagnoses      Ambulation/Gait   Ambulation/Gait  Yes    Ambulation/Gait Assistance  3: Mod assist    Ambulation/Gait Assistance Details  with cane with quad tip and ace wrap for DF due to pt not having new AFO to date; performed gait assessment outside over uneven pavement (downhill and uphill)    Ambulation Distance (Feet)  50 Feet    Assistive device  Straight cane    Gait Pattern  Step-to pattern;Decreased step length - right;Decreased step length - left;Decreased stride length;Decreased dorsiflexion - right;Right genu recurvatum;Poor foot clearance - right required assistance for trunk stability    Ambulation Surface  Unlevel;Outdoor;Paved      Standardized Balance Assessment   Standardized Balance Assessment  10 meter walk test    10 Meter Walk  15.10 seconds with RW/no AFO or 2.17 ft/sec            No data  recorded       [facility] Adult PT Treatment/Exercise - 08/21/17 0858      Self-Care   Self-Care  Other Self-Care Comments    Other Self-Care Comments   Provided pt with tennis balls to posterior RW legs to decrease friction and prevent catching/falling; recommended pt place skiis on new RW.      Knee/Hip Exercises: Standing   Other Standing Knee Exercises  Lateral stepping to L and R in squat position beginning with bilat UE > one UE > no UE but with min/mod A for trunk control x 6 laps             PT Education - 08/21/17 1313    Education provided  Yes    Education Details  Progress towards goals; focus of continued therapy.  Reason for tennis balls or skiis on RW    Person(s) Educated  Patient;Parent(s)    Methods  Explanation    Comprehension  Verbalized understanding       PT Short Term Goals - 07/25/17 2310      PT SHORT TERM GOAL #1   Title  Pt wil participate in and receive appropriate AFO for home/community ambulation    Baseline  07/24/17: pt agreeable to AFO, waiting on consult    Status  Partially Met      PT SHORT TERM GOAL #2   Title  Pt will ambulate 230' on level surfaces with cane and appropriate AFO with supervision    Baseline  07/14/17: working with cane, not to this distance. awaiting an AFO consult as well    Status  Not Met      PT SHORT TERM GOAL #5   Title  Pt will improve standing balance as indicated by increase in BERG balance score to > or = 50/56    Baseline  07/24/17: 35/56 scored today, decreased from last assessment    Status  Not Met 44/56      PT SHORT TERM GOAL #6   Title  Pt will improve safety with gait as indicated by increase in gait velocity to >/= 2.43f/sec with cane and AFO    Baseline  07/24/17: not checked, to be assessed next visit    Time  4    Period  Weeks    Status  On-going Pending AFO        PT Long Term Goals - 08/21/17 1411      PT LONG TERM GOAL #1   Title  Will be independent with HEP with standing HEP  Time  8    Period  Weeks    Status  On-going      PT LONG TERM GOAL #2   Title  Pt will ambulate x 250' outside on pavement and up/down curb with cane, AFO and min A    Baseline  50' with simulated AFO, cane and mod A    Time  8    Period  Weeks    Status  Not Met      PT LONG TERM GOAL #5   Title  Pt will decrease falls risk as indicated by increase in BERG score to > or = 50/56    Baseline  44/56    Time  8    Period  Weeks    Status  Not Met      PT LONG TERM GOAL #7   Title  Pt will improve gait velocity to > or = 2.8 ft/sec with cane and appropriate AFO     Baseline  2.17 ft/sec with RW, no AFO    Time  8    Period  Weeks    Status  Not Met            Plan - 08/21/17 1411    Clinical Impression Statement  Completed re-assessment of LTG; pt did not meet any LTG partially due to pt has not received AFO for home/community ambulation yet which continues to affect pt's ability to ambulate safely due to R foot drag, decreased gait velocity and decreased efficiency of gait.  Pt has participated in AFO assessment and is waiting for orthotist to contact him for appointment at [facility] to trial carbon AFO.  Paperwork has also been submitted for home Bioness unit for ongoing NMR/strengthening.  Initiated gait outside with cane but continues to be unsafe due to imbalance and foot drag.  Pt will benefit from ongoing skilled PT services to continue to address impairments in LE strength, functional endurance, balance and gait with use of Bioness and training with AFO to maximize functional mobility independence and decrease falls risk.    Rehab Potential  Good    Clinical Impairments Affecting Rehab Potential  MS     PT Frequency  2x / week    PT Duration  8 weeks    PT Treatment/Interventions  ADLs/Self Care Home Management;Cryotherapy;Vasopneumatic Device;Taping;Gait training;Stair training;Functional mobility training;Therapeutic activities;Therapeutic exercise;Balance  training;Neuromuscular re-education;Passive range of motion;Manual techniques;Patient/family education;Electrical Stimulation;DME Instruction;Orthotic Fit/Training;Energy conservation    PT Next Visit Plan  Review HEP with Bioness; has [facility] contacted him for appointment? return to use of Bioness bilat lower cuff, R hamstring.  Balance training on slanted surfaces and gravel; Continue hamstring strengthening, ankle strengthening, SLS, rockerboard, stepping strategy, balance with decreased UE support.  Treadmill with Bioness.      PT Home Exercise Plan  Pt has program at home for strengthening and balance    Consulted and Agree with Plan of Care  Patient    Family Member Consulted  mom       Patient will benefit from skilled therapeutic intervention in order to improve the following deficits and impairments:  Abnormal gait, Pain, Decreased range of motion, Decreased balance, Decreased mobility, Difficulty walking, Decreased activity tolerance, Decreased strength, Decreased endurance, Impaired flexibility, Impaired sensation  Visit Diagnosis: Difficulty in walking, not elsewhere classified  Muscle weakness (generalized)  Unsteadiness on feet  Foot drop, left  Foot drop, right     Problem List Patient Active Problem List   Diagnosis Date Noted  . Vitamin  D insufficiency 11/29/2016  . Hypogonadism in male 11/01/2016  . Type 1 diabetes mellitus ([place])   . Hypothyroidism   . Hypertension   . Hyperlipidemia   . Closed fracture of right fibula and tibia 10/29/2016  . Closed tibia fracture 10/29/2016  . Closed fracture of right tibial plafond with fibula involvement 10/29/2016  . Abnormal liver function tests 03/06/2013  . RASH AND OTHER NONSPECIFIC SKIN ERUPTION 10/07/2009  . SHOULDER PAIN, LEFT 12/27/2008  . Type I (juvenile type) diabetes mellitus without mention of complication, uncontrolled 12/03/2007  . HYPOTHYROIDISM 11/12/2007  . HYPERCHOLESTEROLEMIA 11/12/2007  . MULTIPLE  SCLEROSIS 08/29/2007  . PROLIFERATIVE DIABETIC RETINOPATHY 08/29/2007  . HYPERTENSION 12/25/2006    [name], PT, DPT 08/21/17    2:21 PM     [facility] [address] [place], [place], [address] Phone: [phone]   Fax:  [phone]  Name: Johnathan Ross MRN: [id number] Date of Birth: [birth year]

## 2017-08-26 ENCOUNTER — Ambulatory Visit: Payer: Medicare Other | Attending: Orthopedic Surgery | Admitting: Physical Therapy

## 2017-08-26 ENCOUNTER — Encounter: Payer: Self-pay | Admitting: Physical Therapy

## 2017-08-26 DIAGNOSIS — R2681 Unsteadiness on feet: Secondary | ICD-10-CM | POA: Insufficient documentation

## 2017-08-26 DIAGNOSIS — M21372 Foot drop, left foot: Secondary | ICD-10-CM | POA: Diagnosis not present

## 2017-08-26 DIAGNOSIS — R262 Difficulty in walking, not elsewhere classified: Secondary | ICD-10-CM | POA: Diagnosis not present

## 2017-08-26 DIAGNOSIS — M21371 Foot drop, right foot: Secondary | ICD-10-CM | POA: Diagnosis not present

## 2017-08-26 DIAGNOSIS — M6281 Muscle weakness (generalized): Secondary | ICD-10-CM | POA: Insufficient documentation

## 2017-08-26 NOTE — Therapy (Signed)
[facility] [address] [place], [place], [address] Phone: [phone]   Fax:  [phone]  Physical Therapy Treatment  Patient Details  Name: Johnathan Ross MRN: [id number] Date of Birth: 06/03/1970 Referring Provider: Michael Handy, MD   Encounter Date: 08/26/2017  PT End of Session - 08/26/17 2046    Visit Number  59    Number of Visits  74    Date for PT Re-Evaluation  10/20/17    Authorization Type  Medicare    PT Start Time  0848    PT Stop Time  0934    PT Time Calculation (min)  46 min    Activity Tolerance  Patient tolerated treatment well    Behavior During Therapy  WFL for tasks assessed/performed       Past Medical History:  Diagnosis Date  . Hyperlipidemia   . Hypertension   . Hypogonadism in male 11/01/2016  . Hypothyroidism   . Multiple sclerosis (HCC)   . 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 Type 1 diabetes mellitus (HCC) dx'd 1994  . Vitamin D insufficiency 11/29/2016    Past Surgical History:  Procedure Laterality Date  . EYE SURGERY Bilateral    "laser for diabetic retinopathy"  . FRACTURE SURGERY    . OPEN REDUCTION INTERNAL FIXATION (ORIF) TIBIA/FIBULA FRACTURE Right 10/30/2016  . ORIF ANKLE FRACTURE Left 2015  . ORIF TIBIA FRACTURE Right 10/30/2016   Procedure: OPEN REDUCTION INTERNAL FIXATION (ORIF) TIBIA FIBULA FRACTURE;  Surgeon: Handy, Michael, MD;  Location: [facility] OR;  Service: Orthopedics;  Laterality: Right;  . RETINAL LASER PROCEDURE Bilateral    "for diabetic retinopathy"    There were no vitals filed for this visit.  Subjective Assessment - 08/26/17 0850    Subjective  Got a call from [facility], needs to call back to make an appointment.  Got results back from MRI - everything stable from 2 years ago.    Patient is accompained by:  Family member    Pertinent History  MS dx about 6 years ago    How long can you sit comfortably?  unlimited    How long can you stand  comfortably?  unlimited    How long can you walk comfortably?  household distances due to LE fatigue/weakness    Diagnostic tests  X-ray    Patient Stated Goals  Walk without AD;  On 06/17/17 patient states "for me, coming off of that (RW) is how to measure improvement".     Currently in Pain?  No/denies                       [facility] Adult PT Treatment/Exercise - 08/26/17 0851      Knee/Hip Exercises: Standing   Heel Raises  Both;10 reps;5 seconds    Heel Raises Limitations  with Bioness on training mode 10 seconds on when performing bilat toe raises and then single leg toe raises, when Bioness off pt performed heel raises    SLS  with bilat UE support on counter top, alternating SLS with Bioness in training mode x 10 seconds.  Therapist providing cues to maintain upright trunk and level hips.  Due to R glute med weakness pt demonstrates L hip drop in R SLS      Knee/Hip Exercises: Supine   Bridges  Strengthening;Both;1 set;10 reps    Bridges Limitations  with Bioness in training mode and toes lifted for DF activation.  Cues to lift R hip level   with L hip    Single Leg Bridge  Strengthening;Right;Left;1 set;10 reps Bioness in training mode; bridge with alternating march      Modalities   Modalities  Electrical engineer Stimulation Location  bilat anterior tibialis and R hamstring    Electrical Stimulation Action  open and closed chain: bilat ankle DF, R knee flexion and hip extension    Electrical Stimulation Parameters  See Tablet 1, steering electrodes    Electrical Stimulation Goals  Strength;Neuromuscular facilitation             PT Education - 08/26/17 2041    Education provided  Yes    Education Details  HEP with Bioness    Person(s) Educated  Patient    Methods  Explanation    Comprehension  Verbalized understanding       PT Short Term Goals - 08/21/17 1422      PT SHORT TERM GOAL #1   Title  Pt wil  participate in AFO trial with [facility] and initiate training with PT for improved safety during home and community ambulation    Time  4    Period  Weeks    Status  Revised    Target Date  09/20/17      PT SHORT TERM GOAL #2   Title  Pt will ambulate 230' on indoor, level surfaces with cane and appropriate AFO with min A    Time  4    Period  Weeks    Status  Not Met    Target Date  09/20/17      PT SHORT TERM GOAL #5   Title  Pt will improve standing balance as indicated by increase in BERG balance score to > or = 50/56    Baseline  44/56    Time  4    Period  Weeks    Status  Revised 44/56    Target Date  09/20/17      PT SHORT TERM GOAL #6   Title  Pt will improve safety with gait as indicated by increase in gait velocity to >/= 2.59f/sec with LRAD and AFO    Baseline  2.17 - no AFO, RW    Time  4    Period  Weeks    Status  Revised Pending AFO    Target Date  09/20/17        PT Long Term Goals - 08/21/17 1425      PT LONG TERM GOAL #1   Title  Will be independent with HEP with standing HEP and Bioness HEP if approved    Time  8    Period  Weeks    Status  Revised    Target Date  10/20/17      PT LONG TERM GOAL #2   Title  Pt will ambulate x 115' indoors with LRAD and AFO with supervision; ambulate 150' outside on pavement and negotiate 4 stairs with one rail, cane, AFO and min A    Baseline  50' with simulated AFO, cane and mod A    Time  8    Period  Weeks    Status  Revised    Target Date  10/20/17      PT LONG TERM GOAL #3   Title  Pt will demonstrate independence with Bioness home unit (donning/doffing, wear schedule, skin care, care of components)    Baseline  Awaiting approval    Time  8    Period  Weeks    Status  New    Target Date  10/20/17      PT LONG TERM GOAL #5   Title  Pt will decrease falls risk as indicated by increase in BERG score to > or = 52/56    Baseline  44/56    Time  8    Period  Weeks    Status  Revised    Target Date   10/20/17      PT LONG TERM GOAL #7   Title  Pt will improve gait velocity to > or = 2.8 ft/sec with LRAD and appropriate AFO     Baseline  2.17 ft/sec with RW, no AFO    Time  8    Period  Weeks    Status  Revised    Target Date  10/20/17            Plan - 08/26/17 2049    Clinical Impression Statement  Treatment session returned to focus on NMR and strengthening of LE and postural control training with use of Bioness in training mode.  Pt continues to demonstrate difficulty initiating ankle DF in WB positions and maintaining trunk control and proximal hip/core stability in single limb stance.  Will continue to review HEP with Bioness and will continue to progress towards LTG.      Rehab Potential  Good    Clinical Impairments Affecting Rehab Potential  MS     PT Frequency  2x / week    PT Duration  8 weeks    PT Treatment/Interventions  ADLs/Self Care Home Management;Cryotherapy;Vasopneumatic Device;Taping;Gait training;Stair training;Functional mobility training;Therapeutic activities;Therapeutic exercise;Balance training;Neuromuscular re-education;Passive range of motion;Manual techniques;Patient/family education;Electrical Stimulation;DME Instruction;Orthotic Fit/Training;Energy conservation    PT Next Visit Plan  continue to Review HEP with Bioness; does he have appointment with [facility]? return to use of Bioness bilat lower cuff, R hamstring.  Balance training on slanted surfaces and gravel; Continue hamstring strengthening, ankle strengthening, SLS, rockerboard, stepping strategy, balance with decreased UE support.  Treadmill with Bioness.      PT Home Exercise Plan  Pt has program at home for strengthening and balance    Consulted and Agree with Plan of Care  Patient    Family Member Consulted  mom       Patient will benefit from skilled therapeutic intervention in order to improve the following deficits and impairments:  Abnormal gait, Pain, Decreased range of motion, Decreased  balance, Decreased mobility, Difficulty walking, Decreased activity tolerance, Decreased strength, Decreased endurance, Impaired flexibility, Impaired sensation  Visit Diagnosis: Difficulty in walking, not elsewhere classified  Muscle weakness (generalized)  Unsteadiness on feet  Foot drop, left  Foot drop, right     Problem List Patient Active Problem List   Diagnosis Date Noted  . Vitamin D insufficiency 11/29/2016  . Hypogonadism in male 11/01/2016  . Type 1 diabetes mellitus (HCC)   . Hypothyroidism   . Hypertension   . Hyperlipidemia   . Closed fracture of right fibula and tibia 10/29/2016  . Closed tibia fracture 10/29/2016  . Closed fracture of right tibial plafond with fibula involvement 10/29/2016  . Abnormal liver function tests 03/06/2013  . RASH AND OTHER NONSPECIFIC SKIN ERUPTION 10/07/2009  . SHOULDER PAIN, LEFT 12/27/2008  . Type I (juvenile type) diabetes mellitus without mention of complication, uncontrolled 12/03/2007  . HYPOTHYROIDISM 11/12/2007  . HYPERCHOLESTEROLEMIA 11/12/2007  . MULTIPLE SCLEROSIS 08/29/2007  . PROLIFERATIVE DIABETIC RETINOPATHY 08/29/2007  . HYPERTENSION 12/25/2006      Audra F Potter, PT, DPT 08/26/17    9:09 PM    [facility] [address] [place], [place], [address] Phone: [phone]   Fax:  [phone]  Name: Johnathan Ross MRN: [id number] Date of Birth: 05/07/1971   

## 2017-08-26 NOTE — Telephone Encounter (Signed)
LVM with advice from note below and requested a call back if he has any questions

## 2017-08-28 ENCOUNTER — Encounter: Payer: Self-pay | Admitting: Physical Therapy

## 2017-08-28 ENCOUNTER — Ambulatory Visit: Payer: Medicare Other | Admitting: Physical Therapy

## 2017-08-28 DIAGNOSIS — R262 Difficulty in walking, not elsewhere classified: Secondary | ICD-10-CM | POA: Diagnosis not present

## 2017-08-28 DIAGNOSIS — M21372 Foot drop, left foot: Secondary | ICD-10-CM

## 2017-08-28 DIAGNOSIS — R2681 Unsteadiness on feet: Secondary | ICD-10-CM | POA: Diagnosis not present

## 2017-08-28 DIAGNOSIS — M6281 Muscle weakness (generalized): Secondary | ICD-10-CM

## 2017-08-28 DIAGNOSIS — M21371 Foot drop, right foot: Secondary | ICD-10-CM

## 2017-08-28 NOTE — Therapy (Signed)
[facility] [address] [place], [place], [address] Phone: [phone]   Fax:  [phone]  Physical Therapy Treatment  Patient Details  Name: Johnathan Ross MRN: [id number] Date of Birth: July 30, 1970 Referring Provider: Altamese [place], MD   Encounter Date: 08/28/2017  PT End of Session - 08/28/17 1105    Visit Number  60    Number of Visits  59    Date for PT Re-Evaluation  10/20/17    Authorization Type  Medicare    PT Start Time  0853    PT Stop Time  0935    PT Time Calculation (min)  42 min    Activity Tolerance  Patient tolerated treatment well    Behavior During Therapy  [facility] for tasks assessed/performed       Past Medical History:  Diagnosis Date  . Hyperlipidemia   . Hypertension   . Hypogonadism in male 11/01/2016  . Hypothyroidism   . Multiple sclerosis ([place])   . 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 Type 1 diabetes mellitus ([place]) dx'd 1994  . Vitamin D insufficiency 11/29/2016    Past Surgical History:  Procedure Laterality Date  . EYE SURGERY Bilateral    "laser for diabetic retinopathy"  . FRACTURE SURGERY    . OPEN REDUCTION INTERNAL FIXATION (ORIF) TIBIA/FIBULA FRACTURE Right 10/30/2016  . ORIF ANKLE FRACTURE Left 2015  . ORIF TIBIA FRACTURE Right 10/30/2016   Procedure: OPEN REDUCTION INTERNAL FIXATION (ORIF) TIBIA FIBULA FRACTURE;  Surgeon: Altamese [place], MD;  Location: [facility];  Service: Orthopedics;  Laterality: Right;  . RETINAL LASER PROCEDURE Bilateral    "for diabetic retinopathy"    There were no vitals filed for this visit.  Subjective Assessment - 08/28/17 0855    Subjective  Scheduled appointment with [facility] for next Thursday.    Patient is accompained by:  Family member    Pertinent History  MS dx about 6 years ago    How long can you sit comfortably?  unlimited    How long can you stand comfortably?  unlimited    How long can you walk comfortably?  household  distances due to LE fatigue/weakness    Diagnostic tests  X-ray    Patient Stated Goals  Walk without AD;  On 06/17/17 patient states "for me, coming off of that (RW) is how to measure improvement".     Currently in Pain?  No/denies                       [facility] Adult PT Treatment/Exercise - 08/28/17 1059      Knee/Hip Exercises: Standing   Knee Flexion  Strengthening;Right;Left;10 reps Bioness in training mode, alternating    Functional Squat  4 sets;Other (comment) bioness in training, squat with side stepping L and R    Wall Squat  5 reps;10 seconds Bioness in training mode with min A    Other Standing Knee Exercises  Tandem gait x 6 reps and tandem stance x 4 reps, R then L foot forwards with Bioness in training mode      Modalities   Modalities  Electrical Stimulation      Electrical Stimulation   Electrical Stimulation Location  bilat anterior tibialis and R hamstring    Electrical Stimulation Action  open and closed chain: bilat ankle DF, R knee flexion and hip extension    Electrical Stimulation Parameters  See Tablet 1, steering electrodes    Electrical Stimulation Goals  Strength;Neuromuscular  facilitation             PT Education - 08/28/17 1105    Education provided  Yes    Education Details  HEP with [employer]) Educated  Patient    Methods  Explanation;Demonstration    Comprehension  Verbalized understanding;Returned demonstration       PT Short Term Goals - 08/21/17 1422      PT SHORT TERM GOAL #1   Title  Pt wil participate in AFO trial with [facility] and initiate training with PT for improved safety during home and community ambulation    Time  4    Period  Weeks    Status  Revised    Target Date  09/20/17      PT SHORT TERM GOAL #2   Title  Pt will ambulate 230' on indoor, level surfaces with cane and appropriate AFO with min A    Time  4    Period  Weeks    Status  Not Met    Target Date  09/20/17      PT SHORT TERM GOAL #5    Title  Pt will improve standing balance as indicated by increase in BERG balance score to > or = 50/56    Baseline  44/56    Time  4    Period  Weeks    Status  Revised 44/56    Target Date  09/20/17      PT SHORT TERM GOAL #6   Title  Pt will improve safety with gait as indicated by increase in gait velocity to >/= 2.61f/sec with LRAD and AFO    Baseline  2.17 - no AFO, RW    Time  4    Period  Weeks    Status  Revised Pending AFO    Target Date  09/20/17        PT Long Term Goals - 08/21/17 1425      PT LONG TERM GOAL #1   Title  Will be independent with HEP with standing HEP and Bioness HEP if approved    Time  8    Period  Weeks    Status  Revised    Target Date  10/20/17      PT LONG TERM GOAL #2   Title  Pt will ambulate x 115' indoors with LRAD and AFO with supervision; ambulate 150' outside on pavement and negotiate 4 stairs with one rail, cane, AFO and min A    Baseline  50' with simulated AFO, cane and mod A    Time  8    Period  Weeks    Status  Revised    Target Date  10/20/17      PT LONG TERM GOAL #3   Title  Pt will demonstrate independence with Bioness home unit (donning/doffing, wear schedule, skin care, care of components)    Baseline  Awaiting approval    Time  8    Period  Weeks    Status  New    Target Date  10/20/17      PT LONG TERM GOAL #5   Title  Pt will decrease falls risk as indicated by increase in BERG score to > or = 52/56    Baseline  44/56    Time  8    Period  Weeks    Status  Revised    Target Date  10/20/17      PT LONG TERM GOAL #  7   Title  Pt will improve gait velocity to > or = 2.8 ft/sec with LRAD and appropriate AFO     Baseline  2.17 ft/sec with RW, no AFO    Time  8    Period  Weeks    Status  Revised    Target Date  10/20/17            Plan - 08/28/17 1106    Clinical Impression Statement  Continued to review pt HEP with use of Bioness in training mode with continued focus on proximal hip strengthening  and postural control/core strengthening.  Pt tolerated well and able to stand for full session but did demonstrate fatigue with hamstring curls and wall squats.  Will continue to address and make plan for visits once pt has appointment with orthotist next week.      Rehab Potential  Good    Clinical Impairments Affecting Rehab Potential  MS     PT Frequency  2x / week    PT Duration  8 weeks    PT Treatment/Interventions  ADLs/Self Care Home Management;Cryotherapy;Vasopneumatic Device;Taping;Gait training;Stair training;Functional mobility training;Therapeutic activities;Therapeutic exercise;Balance training;Neuromuscular re-education;Passive range of motion;Manual techniques;Patient/family education;Electrical Stimulation;DME Instruction;Orthotic Fit/Training;Energy conservation    PT Next Visit Plan  Appointment with [facility] is Thursday!  Stepping strategy training.  Continue proximal hip/core strengthening with Bioness - tandem stand, side step with squat, tall kneeling and half kneel, closed chain hip ABD (tap downs laterally).  Gait with Bioness.      Consulted and Agree with Plan of Care  Patient    Family Member Consulted  mom       Patient will benefit from skilled therapeutic intervention in order to improve the following deficits and impairments:  Abnormal gait, Pain, Decreased range of motion, Decreased balance, Decreased mobility, Difficulty walking, Decreased activity tolerance, Decreased strength, Decreased endurance, Impaired flexibility, Impaired sensation  Visit Diagnosis: Difficulty in walking, not elsewhere classified  Muscle weakness (generalized)  Unsteadiness on feet  Foot drop, left  Foot drop, right     Problem List Patient Active Problem List   Diagnosis Date Noted  . Vitamin D insufficiency 11/29/2016  . Hypogonadism in male 11/01/2016  . Type 1 diabetes mellitus ([place])   . Hypothyroidism   . Hypertension   . Hyperlipidemia   . Closed fracture of right  fibula and tibia 10/29/2016  . Closed tibia fracture 10/29/2016  . Closed fracture of right tibial plafond with fibula involvement 10/29/2016  . Abnormal liver function tests 03/06/2013  . RASH AND OTHER NONSPECIFIC SKIN ERUPTION 10/07/2009  . SHOULDER PAIN, LEFT 12/27/2008  . Type I (juvenile type) diabetes mellitus without mention of complication, uncontrolled 12/03/2007  . HYPOTHYROIDISM 11/12/2007  . HYPERCHOLESTEROLEMIA 11/12/2007  . MULTIPLE SCLEROSIS 08/29/2007  . PROLIFERATIVE DIABETIC RETINOPATHY 08/29/2007  . HYPERTENSION 12/25/2006    [name], PT, DPT 08/28/17    11:11 AM    [facility] [address] [place], [place], [address] Phone: [phone]   Fax:  [phone]  Name: Johnathan Ross MRN: [id number] Date of Birth: 1971/03/02

## 2017-08-31 ENCOUNTER — Other Ambulatory Visit: Payer: Self-pay | Admitting: Endocrinology

## 2017-09-02 ENCOUNTER — Encounter: Payer: Self-pay | Admitting: Physical Therapy

## 2017-09-02 ENCOUNTER — Ambulatory Visit: Payer: Medicare Other | Admitting: Physical Therapy

## 2017-09-02 DIAGNOSIS — R262 Difficulty in walking, not elsewhere classified: Secondary | ICD-10-CM | POA: Diagnosis not present

## 2017-09-02 DIAGNOSIS — M21372 Foot drop, left foot: Secondary | ICD-10-CM | POA: Diagnosis not present

## 2017-09-02 DIAGNOSIS — M21371 Foot drop, right foot: Secondary | ICD-10-CM | POA: Diagnosis not present

## 2017-09-02 DIAGNOSIS — M6281 Muscle weakness (generalized): Secondary | ICD-10-CM | POA: Diagnosis not present

## 2017-09-02 DIAGNOSIS — R2681 Unsteadiness on feet: Secondary | ICD-10-CM

## 2017-09-02 NOTE — Therapy (Signed)
[facility] [address] [place], [place], [address] Phone: [phone]   Fax:  [phone]  Physical Therapy Treatment  Patient Details  Name: Johnathan Ross MRN: [id number] Date of Birth: 05/27/1971 Referring Provider: Altamese [place], MD   Encounter Date: 09/02/2017  PT End of Session - 09/02/17 0849    Visit Number  53    Number of Visits  72    Date for PT Re-Evaluation  10/20/17    Authorization Type  Medicare    PT Start Time  0846    PT Stop Time  0928    PT Time Calculation (min)  42 min    Equipment Utilized During Treatment  Gait belt    Activity Tolerance  Patient tolerated treatment well    Behavior During Therapy  [facility] for tasks assessed/performed       Past Medical History:  Diagnosis Date  . Hyperlipidemia   . Hypertension   . Hypogonadism in male 11/01/2016  . Hypothyroidism   . Multiple sclerosis ([place])   . 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 Type 1 diabetes mellitus ([place]) dx'd 1994  . Vitamin D insufficiency 11/29/2016    Past Surgical History:  Procedure Laterality Date  . EYE SURGERY Bilateral    "laser for diabetic retinopathy"  . FRACTURE SURGERY    . OPEN REDUCTION INTERNAL FIXATION (ORIF) TIBIA/FIBULA FRACTURE Right 10/30/2016  . ORIF ANKLE FRACTURE Left 2015  . ORIF TIBIA FRACTURE Right 10/30/2016   Procedure: OPEN REDUCTION INTERNAL FIXATION (ORIF) TIBIA FIBULA FRACTURE;  Surgeon: Altamese [place], MD;  Location: [facility];  Service: Orthopedics;  Laterality: Right;  . RETINAL LASER PROCEDURE Bilateral    "for diabetic retinopathy"    There were no vitals filed for this visit.  Subjective Assessment - 09/02/17 0848    Subjective  No new complaints. No falls or pain to report.     Patient is accompained by:  Family member    Pertinent History  MS dx about 6 years ago    How long can you sit comfortably?  unlimited    How long can you stand comfortably?  unlimited    How long  can you walk comfortably?  household distances due to LE fatigue/weakness    Diagnostic tests  X-ray    Patient Stated Goals  Walk without AD;  On 06/17/17 patient states "for me, coming off of that (RW) is how to measure improvement".     Currently in Pain?  No/denies              [facility] Adult PT Treatment/Exercise - 09/02/17 0850      Ambulation/Gait   Ambulation/Gait  Yes    Ambulation/Gait Assistance  5: Supervision;4: Min guard    Ambulation Distance (Feet)  250 Feet    Assistive device  Rolling walker Bioness    Gait Pattern  Step-through pattern;Poor foot clearance - left;Poor foot clearance - right    Ambulation Surface  Level;Indoor      Neuro Re-ed    Neuro Re-ed Details   for strengthening/balance/muscle re-education: side stepping left<>right in squat position x 3 laps each way, tandem gait fwd x 3 laps, both with UE support on counter top, min guard to min assist for balance, cues on posture, ex form; at bottom of steps: steps up with holding contralateral foot in air for on time of 10 sec's, resting during off time of 8 sec's. 2 sets of 5 reps each leg with bil UE  support. cues for "soft knee" with left step up to decrease genu recurvatum. Bioness assisted with control on right LE.                  Acupuncturist Location  bilat anterior tibialis and R hamstring    Electrical Stimulation Action  for bil LE strengthening/muscle re-eduction, improved gait mechanics.     Electrical Stimulation Parameters  refer to tablet 1 for adjusted parameters    Electrical Stimulation Goals  Strength;Neuromuscular facilitation               PT Short Term Goals - 08/21/17 1422      PT SHORT TERM GOAL #1   Title  Pt wil participate in AFO trial with [facility] and initiate training with PT for improved safety during home and community ambulation    Time  4    Period  Weeks    Status  Revised    Target Date  09/20/17      PT SHORT TERM GOAL #2    Title  Pt will ambulate 230' on indoor, level surfaces with cane and appropriate AFO with min A    Time  4    Period  Weeks    Status  Not Met    Target Date  09/20/17      PT SHORT TERM GOAL #5   Title  Pt will improve standing balance as indicated by increase in BERG balance score to > or = 50/56    Baseline  44/56    Time  4    Period  Weeks    Status  Revised 44/56    Target Date  09/20/17      PT SHORT TERM GOAL #6   Title  Pt will improve safety with gait as indicated by increase in gait velocity to >/= 2.69f/sec with LRAD and AFO    Baseline  2.17 - no AFO, RW    Time  4    Period  Weeks    Status  Revised Pending AFO    Target Date  09/20/17        PT Long Term Goals - 08/21/17 1425      PT LONG TERM GOAL #1   Title  Will be independent with HEP with standing HEP and Bioness HEP if approved    Time  8    Period  Weeks    Status  Revised    Target Date  10/20/17      PT LONG TERM GOAL #2   Title  Pt will ambulate x 115' indoors with LRAD and AFO with supervision; ambulate 150' outside on pavement and negotiate 4 stairs with one rail, cane, AFO and min A    Baseline  50' with simulated AFO, cane and mod A    Time  8    Period  Weeks    Status  Revised    Target Date  10/20/17      PT LONG TERM GOAL #3   Title  Pt will demonstrate independence with Bioness home unit (donning/doffing, wear schedule, skin care, care of components)    Baseline  Awaiting approval    Time  8    Period  Weeks    Status  New    Target Date  10/20/17      PT LONG TERM GOAL #5   Title  Pt will decrease falls risk as indicated by increase in BERG score to >  or = 52/56    Baseline  44/56    Time  8    Period  Weeks    Status  Revised    Target Date  10/20/17      PT LONG TERM GOAL #7   Title  Pt will improve gait velocity to > or = 2.8 ft/sec with LRAD and appropriate AFO     Baseline  2.17 ft/sec with RW, no AFO    Time  8    Period  Weeks    Status  Revised    Target  Date  10/20/17            Plan - 09/02/17 0849    Clinical Impression Statement  Today's skilled session continued to use Bioness concurrently with gait, strengthening and balance activities without any issues noted. Pt is making slow, steady progress toward goals and should benefit from continued PT to progress toward unmet goals.     Rehab Potential  Good    Clinical Impairments Affecting Rehab Potential  MS     PT Frequency  2x / week    PT Duration  8 weeks    PT Treatment/Interventions  ADLs/Self Care Home Management;Cryotherapy;Vasopneumatic Device;Taping;Gait training;Stair training;Functional mobility training;Therapeutic activities;Therapeutic exercise;Balance training;Neuromuscular re-education;Passive range of motion;Manual techniques;Patient/family education;Electrical Stimulation;DME Instruction;Orthotic Fit/Training;Energy conservation    PT Next Visit Plan  Appointment with [facility] is Thursday!  Stepping strategy training.  Continue proximal hip/core strengthening with Bioness - tandem stand, side step with squat, tall kneeling and half kneel, closed chain hip ABD (tap downs laterally).  Gait with Bioness.      Consulted and Agree with Plan of Care  Patient    Family Member Consulted  mom       Patient will benefit from skilled therapeutic intervention in order to improve the following deficits and impairments:  Abnormal gait, Pain, Decreased range of motion, Decreased balance, Decreased mobility, Difficulty walking, Decreased activity tolerance, Decreased strength, Decreased endurance, Impaired flexibility, Impaired sensation  Visit Diagnosis: Difficulty in walking, not elsewhere classified  Muscle weakness (generalized)  Unsteadiness on feet  Foot drop, left  Foot drop, right     Problem List Patient Active Problem List   Diagnosis Date Noted  . Vitamin D insufficiency 11/29/2016  . Hypogonadism in male 11/01/2016  . Type 1 diabetes mellitus ([place])   .  Hypothyroidism   . Hypertension   . Hyperlipidemia   . Closed fracture of right fibula and tibia 10/29/2016  . Closed tibia fracture 10/29/2016  . Closed fracture of right tibial plafond with fibula involvement 10/29/2016  . Abnormal liver function tests 03/06/2013  . RASH AND OTHER NONSPECIFIC SKIN ERUPTION 10/07/2009  . SHOULDER PAIN, LEFT 12/27/2008  . Type I (juvenile type) diabetes mellitus without mention of complication, uncontrolled 12/03/2007  . HYPOTHYROIDISM 11/12/2007  . HYPERCHOLESTEROLEMIA 11/12/2007  . MULTIPLE SCLEROSIS 08/29/2007  . PROLIFERATIVE DIABETIC RETINOPATHY 08/29/2007  . HYPERTENSION 12/25/2006    Willow Ora, PTA, [facility] [address], [place], [place] [address] [phone] 09/02/17, 9:30 AM   Name: Johnathan Ross MRN: [id number] Date of Birth: 09/01/1970

## 2017-09-05 ENCOUNTER — Other Ambulatory Visit: Payer: Self-pay

## 2017-09-05 MED ORDER — GLUCOSE BLOOD VI STRP: ORAL_STRIP | 3 refills | Status: DC

## 2017-09-06 ENCOUNTER — Ambulatory Visit: Payer: Medicare Other | Admitting: Physical Therapy

## 2017-09-06 ENCOUNTER — Encounter: Payer: Self-pay | Admitting: Physical Therapy

## 2017-09-06 DIAGNOSIS — M21372 Foot drop, left foot: Secondary | ICD-10-CM

## 2017-09-06 DIAGNOSIS — M6281 Muscle weakness (generalized): Secondary | ICD-10-CM | POA: Diagnosis not present

## 2017-09-06 DIAGNOSIS — M21371 Foot drop, right foot: Secondary | ICD-10-CM | POA: Diagnosis not present

## 2017-09-06 DIAGNOSIS — R262 Difficulty in walking, not elsewhere classified: Secondary | ICD-10-CM | POA: Diagnosis not present

## 2017-09-06 DIAGNOSIS — R2681 Unsteadiness on feet: Secondary | ICD-10-CM

## 2017-09-06 NOTE — Therapy (Signed)
[facility] [address] [place], [place], [address] Phone: [phone]   Fax:  [phone]  Physical Therapy Treatment  Patient Details  Name: Johnathan Ross MRN: [id number] Date of Birth: November 20, 1970 Referring Provider: Altamese [place], MD   Encounter Date: 09/06/2017  PT End of Session - 09/06/17 0851    Visit Number  4    Number of Visits  13    Date for PT Re-Evaluation  10/20/17    Authorization Type  Medicare    PT Start Time  0848    PT Stop Time  0930    PT Time Calculation (min)  42 min    Equipment Utilized During Treatment  Gait belt    Activity Tolerance  Patient tolerated treatment well    Behavior During Therapy  [facility] for tasks assessed/performed       Past Medical History:  Diagnosis Date  . Hyperlipidemia   . Hypertension   . Hypogonadism in male 11/01/2016  . Hypothyroidism   . Multiple sclerosis ([place])   . 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 Type 1 diabetes mellitus ([place]) dx'd 1994  . Vitamin D insufficiency 11/29/2016    Past Surgical History:  Procedure Laterality Date  . EYE SURGERY Bilateral    "laser for diabetic retinopathy"  . FRACTURE SURGERY    . OPEN REDUCTION INTERNAL FIXATION (ORIF) TIBIA/FIBULA FRACTURE Right 10/30/2016  . ORIF ANKLE FRACTURE Left 2015  . ORIF TIBIA FRACTURE Right 10/30/2016   Procedure: OPEN REDUCTION INTERNAL FIXATION (ORIF) TIBIA FIBULA FRACTURE;  Surgeon: Altamese [place], MD;  Location: [facility];  Service: Orthopedics;  Laterality: Right;  . RETINAL LASER PROCEDURE Bilateral    "for diabetic retinopathy"    There were no vitals filed for this visit.  Subjective Assessment - 09/06/17 0851    Subjective  No new complaints. No falls or pain to report.     Patient is accompained by:  Family member    Pertinent History  MS dx about 6 years ago    How long can you sit comfortably?  unlimited    How long can you stand comfortably?  unlimited    How  long can you walk comfortably?  household distances due to LE fatigue/weakness    Diagnostic tests  X-ray    Patient Stated Goals  Walk without AD;  On 06/17/17 patient states "for me, coming off of that (RW) is how to measure improvement".     Currently in Pain?  No/denies           [facility] Adult PT Treatment/Exercise - 09/06/17 0852      Transfers   Sit to Stand  5: Supervision;With upper extremity assist    Stand to Sit  5: Supervision;With upper extremity assist      Ambulation/Gait   Ambulation/Gait  Yes    Ambulation/Gait Assistance  5: Supervision    Ambulation/Gait Assistance Details  1st lap with RW with supervision, no rest breaks needed. 2cd lap with straight cane with min guard to min assist, 1 standing rest break taken.     Ambulation Distance (Feet)  115 Feet x 2, plus around gym with cane    Assistive device  Rolling walker;Straight cane;Other (Comment) cane with rubber quad tip; Bioness    Gait Pattern  Step-through pattern;Poor foot clearance - left;Poor foot clearance - right    Ambulation Surface  Level;Indoor      High Level Balance   High Level Balance Comments  in parallel  bars with light UE support: in squat position-side stepping left<>right, then bwd/fwd walking x 3 laps each with min guard assist and cues on posture/form; tandem gait bwd/fwd x 3 laps each way with cues on technique and posture.       Acupuncturist Location  bilat anterior tibialis and R hamstring    Electrical Stimulation Action  for improved gait mechanics, increased muscle activation, muscle strengthening                   Electrical Stimulation Parameters  refer to tablet 1 for adjusted parameters; steering electrodes both sides    Electrical Stimulation Goals  Strength;Neuromuscular facilitation               PT Short Term Goals - 08/21/17 1422      PT SHORT TERM GOAL #1   Title  Pt wil participate in AFO trial with [facility] and initiate  training with PT for improved safety during home and community ambulation    Time  4    Period  Weeks    Status  Revised    Target Date  09/20/17      PT SHORT TERM GOAL #2   Title  Pt will ambulate 230' on indoor, level surfaces with cane and appropriate AFO with min A    Time  4    Period  Weeks    Status  Not Met    Target Date  09/20/17      PT SHORT TERM GOAL #5   Title  Pt will improve standing balance as indicated by increase in BERG balance score to > or = 50/56    Baseline  44/56    Time  4    Period  Weeks    Status  Revised 44/56    Target Date  09/20/17      PT SHORT TERM GOAL #6   Title  Pt will improve safety with gait as indicated by increase in gait velocity to >/= 2.57f/sec with LRAD and AFO    Baseline  2.17 - no AFO, RW    Time  4    Period  Weeks    Status  Revised Pending AFO    Target Date  09/20/17        PT Long Term Goals - 08/21/17 1425      PT LONG TERM GOAL #1   Title  Will be independent with HEP with standing HEP and Bioness HEP if approved    Time  8    Period  Weeks    Status  Revised    Target Date  10/20/17      PT LONG TERM GOAL #2   Title  Pt will ambulate x 115' indoors with LRAD and AFO with supervision; ambulate 150' outside on pavement and negotiate 4 stairs with one rail, cane, AFO and min A    Baseline  50' with simulated AFO, cane and mod A    Time  8    Period  Weeks    Status  Revised    Target Date  10/20/17      PT LONG TERM GOAL #3   Title  Pt will demonstrate independence with Bioness home unit (donning/doffing, wear schedule, skin care, care of components)    Baseline  Awaiting approval    Time  8    Period  Weeks    Status  New    Target Date  10/20/17  PT LONG TERM GOAL #5   Title  Pt will decrease falls risk as indicated by increase in BERG score to > or = 52/56    Baseline  44/56    Time  8    Period  Weeks    Status  Revised    Target Date  10/20/17      PT LONG TERM GOAL #7   Title  Pt will  improve gait velocity to > or = 2.8 ft/sec with LRAD and appropriate AFO     Baseline  2.17 ft/sec with RW, no AFO    Time  8    Period  Weeks    Status  Revised    Target Date  10/20/17            Plan - 09/06/17 3007    Clinical Impression Statement  Today's skilled session continued with use of Bioness with gait and balance activities without any issues reported. Pt needed less overall assist with gait using straight cane with rubber quad tip today vs with previous sessions. Pt stated several times he feel's it his anxiety and "what's in my head" that keeps holding him back. Inquired if he has set an appt with the neurophy (Dr. Sima Matas is whom he has been referred to) to talk and work through these feelings/issues. Pt stated he did not see how talking would help him. Informed pt that Dr. Sima Matas would help hip learn coping strategies in addition to talking. Pt agreed to think about it. Pt should benefit from continued PT to progress toward unmet goals.                              Rehab Potential  Good    Clinical Impairments Affecting Rehab Potential  MS     PT Frequency  2x / week    PT Duration  8 weeks    PT Treatment/Interventions  ADLs/Self Care Home Management;Cryotherapy;Vasopneumatic Device;Taping;Gait training;Stair training;Functional mobility training;Therapeutic activities;Therapeutic exercise;Balance training;Neuromuscular re-education;Passive range of motion;Manual techniques;Patient/family education;Electrical Stimulation;DME Instruction;Orthotic Fit/Training;Energy conservation    PT Next Visit Plan  Appointment with [facility] is Thursday!  Stepping strategy training.  Continue proximal hip/core strengthening with Bioness - tandem stand, side step with squat, tall kneeling and half kneel, closed chain hip ABD (tap downs laterally).  Gait with Bioness.      Recommended Other Services  now has appt at [facility] on 4/46/19: had to reschedule due to they could not locate his MD  script. He called Dr. Carlean Jews office an had it sent over to [facility].     Consulted and Agree with Plan of Care  Patient    Family Member Consulted  mom       Patient will benefit from skilled therapeutic intervention in order to improve the following deficits and impairments:  Abnormal gait, Pain, Decreased range of motion, Decreased balance, Decreased mobility, Difficulty walking, Decreased activity tolerance, Decreased strength, Decreased endurance, Impaired flexibility, Impaired sensation  Visit Diagnosis: Difficulty in walking, not elsewhere classified  Muscle weakness (generalized)  Unsteadiness on feet  Foot drop, left  Foot drop, right     Problem List Patient Active Problem List   Diagnosis Date Noted  . Vitamin D insufficiency 11/29/2016  . Hypogonadism in male 11/01/2016  . Type 1 diabetes mellitus ([place])   . Hypothyroidism   . Hypertension   . Hyperlipidemia   . Closed fracture of right fibula and tibia 10/29/2016  .  Closed tibia fracture 10/29/2016  . Closed fracture of right tibial plafond with fibula involvement 10/29/2016  . Abnormal liver function tests 03/06/2013  . RASH AND OTHER NONSPECIFIC SKIN ERUPTION 10/07/2009  . SHOULDER PAIN, LEFT 12/27/2008  . Type I (juvenile type) diabetes mellitus without mention of complication, uncontrolled 12/03/2007  . HYPOTHYROIDISM 11/12/2007  . HYPERCHOLESTEROLEMIA 11/12/2007  . MULTIPLE SCLEROSIS 08/29/2007  . PROLIFERATIVE DIABETIC RETINOPATHY 08/29/2007  . HYPERTENSION 12/25/2006    Willow Ora, PTA, [facility] [address] [place], [place] [address] [phone] 09/06/17, 1:59 PM   Name: Johnathan Ross MRN: [id number] Date of Birth: 1971-01-01

## 2017-09-09 ENCOUNTER — Encounter: Payer: Self-pay | Admitting: Physical Therapy

## 2017-09-09 ENCOUNTER — Ambulatory Visit: Payer: Medicare Other | Admitting: Physical Therapy

## 2017-09-09 DIAGNOSIS — M21371 Foot drop, right foot: Secondary | ICD-10-CM

## 2017-09-09 DIAGNOSIS — M21372 Foot drop, left foot: Secondary | ICD-10-CM | POA: Diagnosis not present

## 2017-09-09 DIAGNOSIS — M6281 Muscle weakness (generalized): Secondary | ICD-10-CM | POA: Diagnosis not present

## 2017-09-09 DIAGNOSIS — R262 Difficulty in walking, not elsewhere classified: Secondary | ICD-10-CM | POA: Diagnosis not present

## 2017-09-09 DIAGNOSIS — R2681 Unsteadiness on feet: Secondary | ICD-10-CM

## 2017-09-09 NOTE — Therapy (Signed)
[facility] [address] [place], [place], [address] Phone: [phone]   Fax:  [phone]  Physical Therapy Treatment  Patient Details  Name: Johnathan Ross MRN: [id number] Date of Birth: 04-24-1971 Referring Provider: Altamese Darlington, MD   Encounter Date: 09/09/2017  PT End of Session - 09/09/17 0849    Visit Number  37    Number of Visits  48    Date for PT Re-Evaluation  10/20/17    Authorization Type  Medicare    PT Start Time  0847    PT Stop Time  0927    PT Time Calculation (min)  40 min    Equipment Utilized During Treatment  Gait belt    Activity Tolerance  Patient tolerated treatment well    Behavior During Therapy  [facility] for tasks assessed/performed       Past Medical History:  Diagnosis Date  . Hyperlipidemia   . Hypertension   . Hypogonadism in male 11/01/2016  . Hypothyroidism   . Multiple sclerosis ([place])   . 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 Type 1 diabetes mellitus ([place]) dx'd 1994  . Vitamin D insufficiency 11/29/2016    Past Surgical History:  Procedure Laterality Date  . EYE SURGERY Bilateral    "laser for diabetic retinopathy"  . FRACTURE SURGERY    . OPEN REDUCTION INTERNAL FIXATION (ORIF) TIBIA/FIBULA FRACTURE Right 10/30/2016  . ORIF ANKLE FRACTURE Left 2015  . ORIF TIBIA FRACTURE Right 10/30/2016   Procedure: OPEN REDUCTION INTERNAL FIXATION (ORIF) TIBIA FIBULA FRACTURE;  Surgeon: Altamese [place], MD;  Location: [facility];  Service: Orthopedics;  Laterality: Right;  . RETINAL LASER PROCEDURE Bilateral    "for diabetic retinopathy"    There were no vitals filed for this visit.  Subjective Assessment - 09/09/17 0849    Subjective  No new complaints. No falls or pain to report.     Patient is accompained by:  Family member    Pertinent History  MS dx about 6 years ago    How long can you sit comfortably?  unlimited    How long can you stand comfortably?  unlimited    How  long can you walk comfortably?  household distances due to LE fatigue/weakness    Diagnostic tests  X-ray    Patient Stated Goals  Walk without AD;  On 06/17/17 patient states "for me, coming off of that (RW) is how to measure improvement".     Currently in Pain?  No/denies           [facility] Adult PT Treatment/Exercise - 09/09/17 0907      High Level Balance   High Level Balance Comments  in parallel bars with light UE support: in squat position- side stepping, fwd/bwd walking, tandem gait fwd/bwd. 3 laps each with standing rest breaks as needed due to fatigue. min guard assist for balance.       Neuro Re-ed    Neuro Re-ed Details   for strengthening/balance/muscle re-education: on red mat: in half kneeling- UE raises with 2# ball x 10 reps with each foot forward, min to mod assist for balance with occasional touch to mat table, incr assist with left foot foward; tall kneeling- mini squats with concurrent 3# weight ball OH lift x 10 reps with cues on posture/form, side stepping left<>right while holding 3# ball to chest and chest level x 3 laps each way, then fwd/bwd stepping while holding 3# weight ball at chest level to chest for 3  laps each way, 3 way pulls with green thera band x10 reps each way with emphasis on staying tall/slow band movemetns. min guard assist for all tall kneeling activities with rest breaks taken as needed and occasional touch to mat for balance; in quadruped- alternating leg outs x 10 reps each side, then alternating glut kick backs x 5 reps each side, decreasedlift noted as reps progressed due to fatigue, rest breaks taken at this time with improved form noted on return to ex.           PT Short Term Goals - 08/21/17 1422      PT SHORT TERM GOAL #1   Title  Pt wil participate in AFO trial with [employer] and initiate training with PT for improved safety during home and community ambulation    Time  4    Period  Weeks    Status  Revised    Target Date  09/20/17       PT SHORT TERM GOAL #2   Title  Pt will ambulate 230' on indoor, level surfaces with cane and appropriate AFO with min A    Time  4    Period  Weeks    Status  Not Met    Target Date  09/20/17      PT SHORT TERM GOAL #5   Title  Pt will improve standing balance as indicated by increase in BERG balance score to > or = 50/56    Baseline  44/56    Time  4    Period  Weeks    Status  Revised 44/56    Target Date  09/20/17      PT SHORT TERM GOAL #6   Title  Pt will improve safety with gait as indicated by increase in gait velocity to >/= 2.38f/sec with LRAD and AFO    Baseline  2.17 - no AFO, RW    Time  4    Period  Weeks    Status  Revised Pending AFO    Target Date  09/20/17        PT Long Term Goals - 08/21/17 1425      PT LONG TERM GOAL #1   Title  Will be independent with HEP with standing HEP and Bioness HEP if approved    Time  8    Period  Weeks    Status  Revised    Target Date  10/20/17      PT LONG TERM GOAL #2   Title  Pt will ambulate x 115' indoors with LRAD and AFO with supervision; ambulate 150' outside on pavement and negotiate 4 stairs with one rail, cane, AFO and min A    Baseline  50' with simulated AFO, cane and mod A    Time  8    Period  Weeks    Status  Revised    Target Date  10/20/17      PT LONG TERM GOAL #3   Title  Pt will demonstrate independence with Bioness home unit (donning/doffing, wear schedule, skin care, care of components)    Baseline  Awaiting approval    Time  8    Period  Weeks    Status  New    Target Date  10/20/17      PT LONG TERM GOAL #5   Title  Pt will decrease falls risk as indicated by increase in BERG score to > or = 52/56    Baseline  44/56  Time  8    Period  Weeks    Status  Revised    Target Date  10/20/17      PT LONG TERM GOAL #7   Title  Pt will improve gait velocity to > or = 2.8 ft/sec with LRAD and appropriate AFO     Baseline  2.17 ft/sec with RW, no AFO    Time  8    Period  Weeks    Status   Revised    Target Date  10/20/17         Plan - 09/09/17 0849    Clinical Impression Statement  Today's skilled session focused on strengthening and balance without Bioness as the units where not available at this time. Pt fatigued with activity, however would want to "push through", needed cues to rest with fatigue. Pt is progressing toward goals and should benefit from continued PT to progress toward unmet goals.                                        Rehab Potential  Good    Clinical Impairments Affecting Rehab Potential  MS     PT Frequency  2x / week    PT Duration  8 weeks    PT Treatment/Interventions  ADLs/Self Care Home Management;Cryotherapy;Vasopneumatic Device;Taping;Gait training;Stair training;Functional mobility training;Therapeutic activities;Therapeutic exercise;Balance training;Neuromuscular re-education;Passive range of motion;Manual techniques;Patient/family education;Electrical Stimulation;DME Instruction;Orthotic Fit/Training;Energy conservation    PT Next Visit Plan  Stepping strategy training.  Continue proximal hip/core strengthening with Bioness - tandem stand, side step with squat, tall kneeling and half kneel, closed chain hip ABD (tap downs laterally).  Gait with Bioness.      Consulted and Agree with Plan of Care  Patient    Family Member Consulted  mom       Patient will benefit from skilled therapeutic intervention in order to improve the following deficits and impairments:  Abnormal gait, Pain, Decreased range of motion, Decreased balance, Decreased mobility, Difficulty walking, Decreased activity tolerance, Decreased strength, Decreased endurance, Impaired flexibility, Impaired sensation  Visit Diagnosis: Difficulty in walking, not elsewhere classified  Muscle weakness (generalized)  Unsteadiness on feet  Foot drop, right     Problem List Patient Active Problem List   Diagnosis Date Noted  . Vitamin D insufficiency 11/29/2016  . Hypogonadism in  male 11/01/2016  . Type 1 diabetes mellitus ([place])   . Hypothyroidism   . Hypertension   . Hyperlipidemia   . Closed fracture of right fibula and tibia 10/29/2016  . Closed tibia fracture 10/29/2016  . Closed fracture of right tibial plafond with fibula involvement 10/29/2016  . Abnormal liver function tests 03/06/2013  . RASH AND OTHER NONSPECIFIC SKIN ERUPTION 10/07/2009  . SHOULDER PAIN, LEFT 12/27/2008  . Type I (juvenile type) diabetes mellitus without mention of complication, uncontrolled 12/03/2007  . HYPOTHYROIDISM 11/12/2007  . HYPERCHOLESTEROLEMIA 11/12/2007  . MULTIPLE SCLEROSIS 08/29/2007  . PROLIFERATIVE DIABETIC RETINOPATHY 08/29/2007  . HYPERTENSION 12/25/2006    Willow Ora, PTA, [facility] [address] [place], [place] [address] [phone] 09/09/17, 10:52 AM   Name: Johnathan Ross MRN: [id number] Date of Birth: 10-31-1970

## 2017-09-11 ENCOUNTER — Ambulatory Visit: Payer: Medicare Other | Admitting: Physical Therapy

## 2017-09-11 ENCOUNTER — Encounter: Payer: Self-pay | Admitting: Physical Therapy

## 2017-09-11 DIAGNOSIS — M6281 Muscle weakness (generalized): Secondary | ICD-10-CM | POA: Diagnosis not present

## 2017-09-11 DIAGNOSIS — M21371 Foot drop, right foot: Secondary | ICD-10-CM | POA: Diagnosis not present

## 2017-09-11 DIAGNOSIS — R2681 Unsteadiness on feet: Secondary | ICD-10-CM | POA: Diagnosis not present

## 2017-09-11 DIAGNOSIS — R262 Difficulty in walking, not elsewhere classified: Secondary | ICD-10-CM

## 2017-09-11 DIAGNOSIS — M21372 Foot drop, left foot: Secondary | ICD-10-CM

## 2017-09-11 NOTE — Therapy (Signed)
[facility] [address] [place], [place], [address] Phone: [phone]   Fax:  [phone]  Physical Therapy Treatment  Patient Details  Name: Johnathan Ross MRN: [id number] Date of Birth: 10-17-1970 Referring Provider: Altamese [place], MD   Encounter Date: 09/11/2017  PT End of Session - 09/11/17 0850    Visit Number  64    Number of Visits  82    Date for PT Re-Evaluation  10/20/17    Authorization Type  Medicare    PT Start Time  0849    PT Stop Time  0930    PT Time Calculation (min)  41 min    Equipment Utilized During Treatment  Gait belt    Activity Tolerance  Patient tolerated treatment well    Behavior During Therapy  [facility] for tasks assessed/performed       Past Medical History:  Diagnosis Date  . Hyperlipidemia   . Hypertension   . Hypogonadism in male 11/01/2016  . Hypothyroidism   . Multiple sclerosis ([place])   . 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 Type 1 diabetes mellitus ([place]) dx'd 1994  . Vitamin D insufficiency 11/29/2016    Past Surgical History:  Procedure Laterality Date  . EYE SURGERY Bilateral    "laser for diabetic retinopathy"  . FRACTURE SURGERY    . OPEN REDUCTION INTERNAL FIXATION (ORIF) TIBIA/FIBULA FRACTURE Right 10/30/2016  . ORIF ANKLE FRACTURE Left 2015  . ORIF TIBIA FRACTURE Right 10/30/2016   Procedure: OPEN REDUCTION INTERNAL FIXATION (ORIF) TIBIA FIBULA FRACTURE;  Surgeon: Altamese McCord, MD;  Location: [facility];  Service: Orthopedics;  Laterality: Right;  . RETINAL LASER PROCEDURE Bilateral    "for diabetic retinopathy"    There were no vitals filed for this visit.  Subjective Assessment - 09/11/17 0850    Subjective  No new complaints. No falls or pain to report.  Saw [facility] Gerald Stabs) yesterday, braces will take about 2 weeks and Gerald Stabs will drop them off here for his 09/26/17 appt.     Patient is accompained by:  Family member    Pertinent History  MS dx about 6  years ago    How long can you sit comfortably?  unlimited    How long can you stand comfortably?  unlimited    How long can you walk comfortably?  household distances due to LE fatigue/weakness    Diagnostic tests  X-ray    Patient Stated Goals  Walk without AD;  On 06/17/17 patient states "for me, coming off of that (RW) is how to measure improvement".     Currently in Pain?  No/denies           [facility] Adult PT Treatment/Exercise - 09/11/17 0851      Transfers   Transfers  Sit to Stand;Stand to Sit    Sit to Stand  5: Supervision;With upper extremity assist    Stand to Sit  5: Supervision;With upper extremity assist      Ambulation/Gait   Ambulation/Gait  Yes    Ambulation/Gait Assistance  4: Min guard;4: Min assist    Ambulation/Gait Assistance Details  3-4 short duration rest breaks needed with gait with cane;    Ambulation Distance (Feet)  115 Feet    Assistive device  Straight cane;Other (Comment) with rubber quad tip; Bioness    Gait Pattern  Step-through pattern;Poor foot clearance - left;Poor foot clearance - right    Ambulation Surface  Level;Indoor      High Level Balance  High Level Balance Activities  Side stepping;Backward walking;Tandem walking;Other (comment)    High Level Balance Comments  at counter for single UE support: side stepping in squat position, fwd/bwd walking in squat position, tandem gait fwd/bwd with "soft knees". min guard assist for 3-4 laps each one with cues on ex form and posture.       Neuro Re-ed    Neuro Re-ed Details   for strengthening/balance/muscle re-education: at bottom of steps- step ups (one foot on bottom step, lifting other foot up). 10 sec holds, rest for 8 sec's x 10 reps each leg with light (fingertip) support on rails. at mat table: squats for 5 sec holds, rest for 8 sec's x 10 reps. no UE support with occasional touch to RW for balance correction as needed.        Acupuncturist Location  bilat  anterior tibialis and R hamstring    Electrical Stimulation Action  for improved bil DF assist with gait, increased knee flexion with swing phase and increased knee control in stance    Electrical Stimulation Parameters  refer to table 1 for adjusted parameters, steering eletrodes bil lower cuffs                      Electrical Stimulation Goals  Strength;Neuromuscular facilitation          PT Short Term Goals - 08/21/17 1422      PT SHORT TERM GOAL #1   Title  Pt wil participate in AFO trial with [employer] and initiate training with PT for improved safety during home and community ambulation    Time  4    Period  Weeks    Status  Revised    Target Date  09/20/17      PT SHORT TERM GOAL #2   Title  Pt will ambulate 230' on indoor, level surfaces with cane and appropriate AFO with min A    Time  4    Period  Weeks    Status  Not Met    Target Date  09/20/17      PT SHORT TERM GOAL #5   Title  Pt will improve standing balance as indicated by increase in BERG balance score to > or = 50/56    Baseline  44/56    Time  4    Period  Weeks    Status  Revised 44/56    Target Date  09/20/17      PT SHORT TERM GOAL #6   Title  Pt will improve safety with gait as indicated by increase in gait velocity to >/= 2.49f/sec with LRAD and AFO    Baseline  2.17 - no AFO, RW    Time  4    Period  Weeks    Status  Revised Pending AFO    Target Date  09/20/17        PT Long Term Goals - 08/21/17 1425      PT LONG TERM GOAL #1   Title  Will be independent with HEP with standing HEP and Bioness HEP if approved    Time  8    Period  Weeks    Status  Revised    Target Date  10/20/17      PT LONG TERM GOAL #2   Title  Pt will ambulate x 115' indoors with LRAD and AFO with supervision; ambulate 150' outside on pavement and negotiate 4 stairs with one rail,  cane, AFO and min A    Baseline  50' with simulated AFO, cane and mod A    Time  8    Period  Weeks    Status  Revised    Target Date   10/20/17      PT LONG TERM GOAL #3   Title  Pt will demonstrate independence with Bioness home unit (donning/doffing, wear schedule, skin care, care of components)    Baseline  Awaiting approval    Time  8    Period  Weeks    Status  New    Target Date  10/20/17      PT LONG TERM GOAL #5   Title  Pt will decrease falls risk as indicated by increase in BERG score to > or = 52/56    Baseline  44/56    Time  8    Period  Weeks    Status  Revised    Target Date  10/20/17      PT LONG TERM GOAL #7   Title  Pt will improve gait velocity to > or = 2.8 ft/sec with LRAD and appropriate AFO     Baseline  2.17 ft/sec with RW, no AFO    Time  8    Period  Weeks    Status  Revised    Target Date  10/20/17            Plan - 09/11/17 0851    Clinical Impression Statement  Today's skilled session returned to use of Bioness concurrently with gait, strengthening and balance. Pt with improved balance and activity tolerance today, did not fatigue as quickly with fewer rest breaks needed. Pt is progressing toward goals and should benefit from continued PT to progress toward unmet goals.     Rehab Potential  Good    Clinical Impairments Affecting Rehab Potential  MS     PT Frequency  2x / week    PT Duration  8 weeks    PT Treatment/Interventions  ADLs/Self Care Home Management;Cryotherapy;Vasopneumatic Device;Taping;Gait training;Stair training;Functional mobility training;Therapeutic activities;Therapeutic exercise;Balance training;Neuromuscular re-education;Passive range of motion;Manual techniques;Patient/family education;Electrical Stimulation;DME Instruction;Orthotic Fit/Training;Energy conservation    PT Next Visit Plan  Stepping strategy training.  Continue proximal hip/core strengthening with Bioness - tandem stand, side step with squat, tall kneeling and half kneel, closed chain hip ABD (tap downs laterally).  Gait with Bioness.      Consulted and Agree with Plan of Care  Patient     Family Member Consulted  mom       Patient will benefit from skilled therapeutic intervention in order to improve the following deficits and impairments:  Abnormal gait, Pain, Decreased range of motion, Decreased balance, Decreased mobility, Difficulty walking, Decreased activity tolerance, Decreased strength, Decreased endurance, Impaired flexibility, Impaired sensation  Visit Diagnosis: Difficulty in walking, not elsewhere classified  Muscle weakness (generalized)  Unsteadiness on feet  Foot drop, right  Foot drop, left     Problem List Patient Active Problem List   Diagnosis Date Noted  . Vitamin D insufficiency 11/29/2016  . Hypogonadism in male 11/01/2016  . Type 1 diabetes mellitus ([place])   . Hypothyroidism   . Hypertension   . Hyperlipidemia   . Closed fracture of right fibula and tibia 10/29/2016  . Closed tibia fracture 10/29/2016  . Closed fracture of right tibial plafond with fibula involvement 10/29/2016  . Abnormal liver function tests 03/06/2013  . RASH AND OTHER NONSPECIFIC SKIN ERUPTION 10/07/2009  . SHOULDER PAIN,  LEFT 12/27/2008  . Type I (juvenile type) diabetes mellitus without mention of complication, uncontrolled 12/03/2007  . HYPOTHYROIDISM 11/12/2007  . HYPERCHOLESTEROLEMIA 11/12/2007  . MULTIPLE SCLEROSIS 08/29/2007  . PROLIFERATIVE DIABETIC RETINOPATHY 08/29/2007  . HYPERTENSION 12/25/2006    Willow Ora, PTA, [facility] [address] [place], [place] [address] [phone] 09/11/17, 11:29 AM   Name: Johnathan Ross MRN: [id number] Date of Birth: [birth year]

## 2017-09-12 ENCOUNTER — Telehealth: Payer: Self-pay | Admitting: *Deleted

## 2017-09-12 ENCOUNTER — Other Ambulatory Visit: Payer: Self-pay | Admitting: Endocrinology

## 2017-09-12 MED ORDER — GLUCOSE BLOOD VI STRP: ORAL_STRIP | 3 refills | Status: DC

## 2017-09-12 NOTE — Telephone Encounter (Signed)
I spoke to pt and advised him of below. He wants Contour next test strips sent to [facility] on [address] and [address] New Rx sent. See meds.

## 2017-09-12 NOTE — Telephone Encounter (Signed)
This is a [facility] problem and he needs to go elsewhere for his prescriptions are trying to get them from his pump supplier

## 2017-09-12 NOTE — Telephone Encounter (Signed)
[facility] from [facility] stating Medicare will only cover Contour next test strips testing up to 3 x/day.  Is patient required to check CBGs 8 x/day?

## 2017-09-13 ENCOUNTER — Other Ambulatory Visit: Payer: Self-pay | Admitting: Endocrinology

## 2017-09-16 ENCOUNTER — Other Ambulatory Visit: Payer: Self-pay

## 2017-09-16 MED ORDER — GLUCOSE BLOOD VI STRP: ORAL_STRIP | 3 refills | Status: DC

## 2017-09-16 NOTE — Telephone Encounter (Signed)
Pt called and stated that the pharmacy never received his prescription. Pt was then informed that RX was sent to [facility] on [address]. Pt then stated that he did not go that pharmacy. He went to the nearest 24 hour pharmacy and tried to pick it up. Pt began questioning why staff couldn't just send the script to the nearest 24 hours pharmacy. Pt was then notified that we cannot do that because we do not know what is closest to him, therefore, he must specify the pharmacy to send the medication. Pt then began voicing c/o situations like this happening in the past and stating that it is going to continue to happen. Pt then stated that he would like to script sent to the [facility] on [address]. I read the address to the patient and he verbally confirmed the address to send the RX. Once the Rx was sent, I verbally read the script to pt and then re-read the pharmacy and address, and then read the confirmation receipt to pt. Pt verbalized understanding.

## 2017-09-17 ENCOUNTER — Ambulatory Visit: Payer: Medicare Other | Admitting: Physical Therapy

## 2017-09-17 ENCOUNTER — Other Ambulatory Visit: Payer: Self-pay | Admitting: Endocrinology

## 2017-09-17 ENCOUNTER — Encounter: Payer: Self-pay | Admitting: Physical Therapy

## 2017-09-17 DIAGNOSIS — R262 Difficulty in walking, not elsewhere classified: Secondary | ICD-10-CM

## 2017-09-17 DIAGNOSIS — M21371 Foot drop, right foot: Secondary | ICD-10-CM

## 2017-09-17 DIAGNOSIS — R2681 Unsteadiness on feet: Secondary | ICD-10-CM

## 2017-09-17 DIAGNOSIS — M21372 Foot drop, left foot: Secondary | ICD-10-CM | POA: Diagnosis not present

## 2017-09-17 DIAGNOSIS — M6281 Muscle weakness (generalized): Secondary | ICD-10-CM | POA: Diagnosis not present

## 2017-09-17 NOTE — Therapy (Signed)
[facility] [address] [place], [place], [address] Phone: [phone]   Fax:  [phone]  Physical Therapy Treatment  Patient Details  Name: Johnathan Ross MRN: [id number] Date of Birth: 02-11-1971 Referring Provider: Altamese [place], MD   Encounter Date: 09/17/2017  PT End of Session - 09/17/17 1300    Visit Number  65    Number of Visits  51    Date for PT Re-Evaluation  10/20/17    Authorization Type  Medicare    PT Start Time  0850    PT Stop Time  0935    PT Time Calculation (min)  45 min    Activity Tolerance  Patient tolerated treatment well    Behavior During Therapy  [facility] for tasks assessed/performed       Past Medical History:  Diagnosis Date  . Hyperlipidemia   . Hypertension   . Hypogonadism in male 11/01/2016  . Hypothyroidism   . Multiple sclerosis ([place])   . 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 Type 1 diabetes mellitus ([place]) dx'd 1994  . Vitamin D insufficiency 11/29/2016    Past Surgical History:  Procedure Laterality Date  . EYE SURGERY Bilateral    "laser for diabetic retinopathy"  . FRACTURE SURGERY    . OPEN REDUCTION INTERNAL FIXATION (ORIF) TIBIA/FIBULA FRACTURE Right 10/30/2016  . ORIF ANKLE FRACTURE Left 2015  . ORIF TIBIA FRACTURE Right 10/30/2016   Procedure: OPEN REDUCTION INTERNAL FIXATION (ORIF) TIBIA FIBULA FRACTURE;  Surgeon: Altamese [place], MD;  Location: [facility];  Service: Orthopedics;  Laterality: Right;  . RETINAL LASER PROCEDURE Bilateral    "for diabetic retinopathy"    There were no vitals filed for this visit.  Subjective Assessment - 09/17/17 0854    Subjective  MS society still working on funding for AFO and Bioness.  L glute was a little sore after last session but was happy with ambulation with cane.    Patient is accompained by:  Family member    Pertinent History  MS dx about 6 years ago    How long can you sit comfortably?  unlimited    How long can  you stand comfortably?  unlimited    How long can you walk comfortably?  household distances due to LE fatigue/weakness    Diagnostic tests  X-ray    Patient Stated Goals  Walk without AD;  On 06/17/17 patient states "for me, coming off of that (RW) is how to measure improvement".     Currently in Pain?  No/denies                       [facility] Adult PT Treatment/Exercise - 09/17/17 1137      Ambulation/Gait   Ambulation/Gait  Yes    Ambulation/Gait Assistance  4: Min assist    Ambulation/Gait Assistance Details  Bioness in gait mode with settings adjusted for hamstring activation and ankle DF activation to facilitate increased hip extension and prolonged activation of closed chain tibial DF.  Pt able to ambulate with only 2-3 standing rest breaks as LE fatigued; 2-3 episodes of R toe catching and forward LOB requiring mod A to recover    Ambulation Distance (Feet)  115 Feet x 2    Assistive device  Straight cane    Gait Pattern  Step-through pattern;Right genu recurvatum;Poor foot clearance - right;Lateral hip instability    Ambulation Surface  Level;Indoor      Knee/Hip Exercises: Standing   Hip  Abduction  Stengthening;Right;Left;4 sets;10 reps    Abduction Limitations  closed chain hip dips on 2" step, mirror for visual feedback and therapist facilitation at pelvis, Bioness in training mode for activation of ankle DF and hamstring for stabilization and prevent COG from moving posterior to BOS      Modalities   Modalities  Electrical Stimulation      Electrical Stimulation   Electrical Stimulation Location  bilat anterior tibialis and R hamstring    Electrical Stimulation Action  open and closed chain: bilat ankle DF, knee flexion, hip extension    Electrical Stimulation Parameters  adjustments and settings saved Tablet 1; steering electrodes    Electrical Stimulation Goals  Strength;Neuromuscular facilitation          Balance Exercises - 09/17/17 1138      Balance  Exercises: Standing   Stepping Strategy  Anterior;UE support;10 reps LUE support, stepping with R/LLE        PT Education - 09/17/17 1258    Education provided  Yes    Education Details  plan to wait and assess STG once he has AFO    Person(s) Educated  Patient    Methods  Explanation    Comprehension  Verbalized understanding       PT Short Term Goals - 09/17/17 1304      PT SHORT TERM GOAL #1   Title  Pt wil participate in AFO trial with [employer] and initiate training with PT for improved safety during home and community ambulation    Time  4    Period  Weeks    Status  Revised    Target Date  09/26/17      PT SHORT TERM GOAL #2   Title  Pt will ambulate 230' on indoor, level surfaces with cane and appropriate AFO with min A    Time  4    Period  Weeks    Status  Revised    Target Date  09/26/17      PT SHORT TERM GOAL #5   Title  Pt will improve standing balance as indicated by increase in BERG balance score to > or = 50/56    Baseline  44/56    Time  4    Period  Weeks    Status  Revised 44/56    Target Date  09/26/17      PT SHORT TERM GOAL #6   Title  Pt will improve safety with gait as indicated by increase in gait velocity to >/= 2.94ft/sec with LRAD and AFO    Baseline  2.17 - no AFO, RW    Time  4    Period  Weeks    Status  Revised Pending AFO    Target Date  09/26/17        PT Long Term Goals - 08/21/17 1425      PT LONG TERM GOAL #1   Title  Will be independent with HEP with standing HEP and Bioness HEP if approved    Time  8    Period  Weeks    Status  Revised    Target Date  10/20/17      PT LONG TERM GOAL #2   Title  Pt will ambulate x 115' indoors with LRAD and AFO with supervision; ambulate 150' outside on pavement and negotiate 4 stairs with one rail, cane, AFO and min A    Baseline  50' with simulated AFO, cane and mod A  Time  8    Period  Weeks    Status  Revised    Target Date  10/20/17      PT LONG TERM GOAL #3   Title  Pt  will demonstrate independence with Bioness home unit (donning/doffing, wear schedule, skin care, care of components)    Baseline  Awaiting approval    Time  8    Period  Weeks    Status  New    Target Date  10/20/17      PT LONG TERM GOAL #5   Title  Pt will decrease falls risk as indicated by increase in BERG score to > or = 52/56    Baseline  44/56    Time  8    Period  Weeks    Status  Revised    Target Date  10/20/17      PT LONG TERM GOAL #7   Title  Pt will improve gait velocity to > or = 2.8 ft/sec with LRAD and appropriate AFO     Baseline  2.17 ft/sec with RW, no AFO    Time  8    Period  Weeks    Status  Revised    Target Date  10/20/17            Plan - 09/17/17 1300    Clinical Impression Statement  Plan is still for pt to receive AFO on 5/2; will hold on checking STG until pt receives AFO.  Continued use of Bioness functional electrical stimulation for gait training with cane on level surfaces, proximal hip and closed chain hip ABD training and during balance reaction step strategy training.  Pt tolerated well and demonstrated improved functional/muscular endurance with Bioness today with decreased R foot drag and decreased assistance from therapist.  Continues to demonstrate significant weakness and fatigue in proximal hip muscles for stability.  Will continue to address and progress towards LTG.    Rehab Potential  Good    Clinical Impairments Affecting Rehab Potential  MS     PT Frequency  2x / week    PT Duration  8 weeks    PT Treatment/Interventions  ADLs/Self Care Home Management;Cryotherapy;Vasopneumatic Device;Taping;Gait training;Stair training;Functional mobility training;Therapeutic activities;Therapeutic exercise;Balance training;Neuromuscular re-education;Passive range of motion;Manual techniques;Patient/family education;Electrical Stimulation;DME Instruction;Orthotic Fit/Training;Energy conservation    PT Next Visit Plan  Don't assess STG until he  receives AFO on 5/2; Continue Stepping strategy training.  Continue proximal hip/core strengthening with Bioness - tandem stand, side step with squat, tall kneeling and half kneel, closed chain hip ABD (tap downs laterally).  Gait with Bioness.      Consulted and Agree with Plan of Care  Patient    Family Member Consulted  mom       Patient will benefit from skilled therapeutic intervention in order to improve the following deficits and impairments:  Abnormal gait, Pain, Decreased range of motion, Decreased balance, Decreased mobility, Difficulty walking, Decreased activity tolerance, Decreased strength, Decreased endurance, Impaired flexibility, Impaired sensation  Visit Diagnosis: Difficulty in walking, not elsewhere classified  Muscle weakness (generalized)  Unsteadiness on feet  Foot drop, right  Foot drop, left     Problem List Patient Active Problem List   Diagnosis Date Noted  . Vitamin D insufficiency 11/29/2016  . Hypogonadism in male 11/01/2016  . Type 1 diabetes mellitus ([place])   . Hypothyroidism   . Hypertension   . Hyperlipidemia   . Closed fracture of right fibula and tibia 10/29/2016  . Closed  tibia fracture 10/29/2016  . Closed fracture of right tibial plafond with fibula involvement 10/29/2016  . Abnormal liver function tests 03/06/2013  . RASH AND OTHER NONSPECIFIC SKIN ERUPTION 10/07/2009  . SHOULDER PAIN, LEFT 12/27/2008  . Type I (juvenile type) diabetes mellitus without mention of complication, uncontrolled 12/03/2007  . HYPOTHYROIDISM 11/12/2007  . HYPERCHOLESTEROLEMIA 11/12/2007  . MULTIPLE SCLEROSIS 08/29/2007  . PROLIFERATIVE DIABETIC RETINOPATHY 08/29/2007  . HYPERTENSION 12/25/2006    [name], PT, DPT 09/17/17    1:07 PM    [facility] [address] [place], [place], [address] Phone: [phone]   Fax:  [phone]  Name: Johnathan Ross MRN: [id number] Date of  Birth: March 25, 1971

## 2017-09-18 ENCOUNTER — Telehealth: Payer: Self-pay | Admitting: Endocrinology

## 2017-09-18 NOTE — Telephone Encounter (Signed)
Pt called and voicemail left to have pt call back.

## 2017-09-18 NOTE — Telephone Encounter (Signed)
Pharmacy was called and pharmacist stated that RX was received on 09/16/2017. Diabetic education informed me that pt must call [facility] at [phone] and inform them he is trying to get his test strips and the pharmacy states he needs a CMN.

## 2017-09-18 NOTE — Telephone Encounter (Signed)
Patient stated the pharmacy have not received test strips for the glucose blood (CONTOUR NEXT TEST) test strip [[id number]]    Pharmacy:  [facility] [place], [place] #:  [id number]

## 2017-09-20 ENCOUNTER — Encounter: Payer: Self-pay | Admitting: Physical Therapy

## 2017-09-20 ENCOUNTER — Ambulatory Visit: Payer: Medicare Other | Admitting: Physical Therapy

## 2017-09-20 DIAGNOSIS — R262 Difficulty in walking, not elsewhere classified: Secondary | ICD-10-CM

## 2017-09-20 DIAGNOSIS — R2681 Unsteadiness on feet: Secondary | ICD-10-CM | POA: Diagnosis not present

## 2017-09-20 DIAGNOSIS — M21371 Foot drop, right foot: Secondary | ICD-10-CM

## 2017-09-20 DIAGNOSIS — M6281 Muscle weakness (generalized): Secondary | ICD-10-CM | POA: Diagnosis not present

## 2017-09-20 DIAGNOSIS — M21372 Foot drop, left foot: Secondary | ICD-10-CM | POA: Diagnosis not present

## 2017-09-20 NOTE — Telephone Encounter (Signed)
Pt called back because he received a missed call from this office. Pt stated that he did not received the voicemail left, so information was given to him again. Pt verbalized understanding of this information and stated that he would call them. Patient was encouraged to call back with and comments, questions, or concerns.

## 2017-09-20 NOTE — Telephone Encounter (Signed)
Pt called and detailed voice message left for patient and instructed him that he must call [facility] [place] and get assistance in getting prior approval so insurance will pay for his testing strips. Pt was informed that he can reach the [place] PA line at [phone]. He was also encouraged to call this office back with any comment, questions, or concerns.

## 2017-09-21 NOTE — Therapy (Signed)
[facility] [address] [place], [place], [address] Phone: [phone]   Fax:  [phone]  Physical Therapy Treatment  Patient Details  Name: Johnathan Ross MRN: [id number] Date of Birth: 08/29/1970 Referring Provider: Altamese [place], MD   Encounter Date: 09/20/2017  PT End of Session - 09/21/17 1524    Visit Number  68    Number of Visits  10    Date for PT Re-Evaluation  10/20/17    Authorization Type  Medicare    PT Start Time  0848    PT Stop Time  0932    PT Time Calculation (min)  44 min    Equipment Utilized During Treatment  Other (comment) Bioness    Activity Tolerance  Patient tolerated treatment well    Behavior During Therapy  [facility] for tasks assessed/performed       Past Medical History:  Diagnosis Date  . Hyperlipidemia   . Hypertension   . Hypogonadism in male 11/01/2016  . Hypothyroidism   . Multiple sclerosis ([place])   . 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 Type 1 diabetes mellitus ([place]) dx'd 1994  . Vitamin D insufficiency 11/29/2016    Past Surgical History:  Procedure Laterality Date  . EYE SURGERY Bilateral    "laser for diabetic retinopathy"  . FRACTURE SURGERY    . OPEN REDUCTION INTERNAL FIXATION (ORIF) TIBIA/FIBULA FRACTURE Right 10/30/2016  . ORIF ANKLE FRACTURE Left 2015  . ORIF TIBIA FRACTURE Right 10/30/2016   Procedure: OPEN REDUCTION INTERNAL FIXATION (ORIF) TIBIA FIBULA FRACTURE;  Surgeon: Altamese [place], MD;  Location: [facility];  Service: Orthopedics;  Laterality: Right;  . RETINAL LASER PROCEDURE Bilateral    "for diabetic retinopathy"    There were no vitals filed for this visit.  Subjective Assessment - 09/20/17 0850    Subjective  Nothing new to report; doing well, no pain or falls to report.    Patient is accompained by:  Family member    Pertinent History  MS dx about 6 years ago    How long can you sit comfortably?  unlimited    How long can you stand  comfortably?  unlimited    How long can you walk comfortably?  household distances due to LE fatigue/weakness    Diagnostic tests  X-ray    Patient Stated Goals  Walk without AD;  On 06/17/17 patient states "for me, coming off of that (RW) is how to measure improvement".     Currently in Pain?  No/denies                       [facility] Adult PT Treatment/Exercise - 09/21/17 1454      Ambulation/Gait   Ambulation/Gait  Yes    Ambulation/Gait Assistance  1: +2 Total assist    Ambulation/Gait Assistance Details  Gait with cane and Bioness + use of sports cord around patient's pelvis with therapist applying perturbations to patient's balance (anterior to patient) to continue to facilitate balance reactions and stepping strategy; second person for safety; pt required intermittent standing rest breaks due to LE fatigue causing increased RLE genu recurvatum.  With RLE in recurvatum, pt unable to advance RLE during stepping strategy    Ambulation Distance (Feet)  115 Feet    Assistive device  Straight cane    Ambulation Surface  Level;Indoor      Modalities   Modalities  Teacher, English as a foreign language  Location  bilat anterior tibialis and R hamstring    Electrical Stimulation Action  open and closed chain: bilat ankle DF, knee flexion, hip extension    Electrical Stimulation Parameters  adjustments and settings saved Tablet 1; steering electrodes    Electrical Stimulation Goals  Strength;Neuromuscular facilitation          Balance Exercises - 09/21/17 1521      Balance Exercises: Standing   Stepping Strategy  Anterior;Posterior;Foam/compliant surface;10 reps    Other Standing Exercises  Performed stepping strategy training in // bars on solid surface initially with pt leaning into therapist but pt noted to be utilizing increased trunk flexion and unable to advance LE far enough forward to correct balance.  Transitioned to using a  sports cord around pt pelvis and applying perturbations with sports cord with Bioness applied in training mode.  Transitioned to pt standing across low balance beam and performing anterior/posterior stepping strategy.  Pt's natural tendency is to step with RLE and stabilize on L; cued pt to also step forward with LLE to improve stability/activation on RLE.          PT Education - 09/21/17 1524    Education provided  Yes    Education Details  stepping strategy    Person(s) Educated  Patient    Methods  Explanation    Comprehension  Verbalized understanding       PT Short Term Goals - 09/17/17 1304      PT SHORT TERM GOAL #1   Title  Pt wil participate in AFO trial with [employer] and initiate training with PT for improved safety during home and community ambulation    Time  4    Period  Weeks    Status  Revised    Target Date  09/26/17      PT SHORT TERM GOAL #2   Title  Pt will ambulate 230' on indoor, level surfaces with cane and appropriate AFO with min A    Time  4    Period  Weeks    Status  Revised    Target Date  09/26/17      PT SHORT TERM GOAL #5   Title  Pt will improve standing balance as indicated by increase in BERG balance score to > or = 50/56    Baseline  44/56    Time  4    Period  Weeks    Status  Revised 44/56    Target Date  09/26/17      PT SHORT TERM GOAL #6   Title  Pt will improve safety with gait as indicated by increase in gait velocity to >/= 2.83ft/sec with LRAD and AFO    Baseline  2.17 - no AFO, RW    Time  4    Period  Weeks    Status  Revised Pending AFO    Target Date  09/26/17        PT Long Term Goals - 08/21/17 1425      PT LONG TERM GOAL #1   Title  Will be independent with HEP with standing HEP and Bioness HEP if approved    Time  8    Period  Weeks    Status  Revised    Target Date  10/20/17      PT LONG TERM GOAL #2   Title  Pt will ambulate x 115' indoors with LRAD and AFO with supervision; ambulate 150' outside on pavement  and negotiate 4 stairs  with one rail, cane, AFO and min A    Baseline  50' with simulated AFO, cane and mod A    Time  8    Period  Weeks    Status  Revised    Target Date  10/20/17      PT LONG TERM GOAL #3   Title  Pt will demonstrate independence with Bioness home unit (donning/doffing, wear schedule, skin care, care of components)    Baseline  Awaiting approval    Time  8    Period  Weeks    Status  New    Target Date  10/20/17      PT LONG TERM GOAL #5   Title  Pt will decrease falls risk as indicated by increase in BERG score to > or = 52/56    Baseline  44/56    Time  8    Period  Weeks    Status  Revised    Target Date  10/20/17      PT LONG TERM GOAL #7   Title  Pt will improve gait velocity to > or = 2.8 ft/sec with LRAD and appropriate AFO     Baseline  2.17 ft/sec with RW, no AFO    Time  8    Period  Weeks    Status  Revised    Target Date  10/20/17            Plan - 09/21/17 1525    Clinical Impression Statement  Continued balance reaction and stepping strategy training on solid surface and then standing across balance beam for increased challenge; transitioned to training and utilizing stepping strategy for balance during gait with cane.  Pt tolerated well demonstrating improved timing and use of stepping strategy.  Will continue to address and progress.      Rehab Potential  Good    Clinical Impairments Affecting Rehab Potential  MS     PT Frequency  2x / week    PT Duration  8 weeks    PT Treatment/Interventions  ADLs/Self Care Home Management;Cryotherapy;Vasopneumatic Device;Taping;Gait training;Stair training;Functional mobility training;Therapeutic activities;Therapeutic exercise;Balance training;Neuromuscular re-education;Passive range of motion;Manual techniques;Patient/family education;Electrical Stimulation;DME Instruction;Orthotic Fit/Training;Energy conservation    PT Next Visit Plan  Assess STG when he receives AFO on 5/2; Continue Stepping  strategy training using sports cord for perturbations-vary surfaces; during gait if you have +2.  Continue proximal hip/core strengthening with Bioness - tandem stand, side step with squat, tall kneeling and half kneel, closed chain hip ABD (tap downs laterally).  Gait with Bioness.      Consulted and Agree with Plan of Care  Patient    Family Member Consulted  mom       Patient will benefit from skilled therapeutic intervention in order to improve the following deficits and impairments:  Abnormal gait, Pain, Decreased range of motion, Decreased balance, Decreased mobility, Difficulty walking, Decreased activity tolerance, Decreased strength, Decreased endurance, Impaired flexibility, Impaired sensation  Visit Diagnosis: Difficulty in walking, not elsewhere classified  Muscle weakness (generalized)  Unsteadiness on feet  Foot drop, right  Foot drop, left     Problem List Patient Active Problem List   Diagnosis Date Noted  . Vitamin D insufficiency 11/29/2016  . Hypogonadism in male 11/01/2016  . Type 1 diabetes mellitus ([place])   . Hypothyroidism   . Hypertension   . Hyperlipidemia   . Closed fracture of right fibula and tibia 10/29/2016  . Closed tibia fracture 10/29/2016  . Closed fracture of  right tibial plafond with fibula involvement 10/29/2016  . Abnormal liver function tests 03/06/2013  . RASH AND OTHER NONSPECIFIC SKIN ERUPTION 10/07/2009  . SHOULDER PAIN, LEFT 12/27/2008  . Type I (juvenile type) diabetes mellitus without mention of complication, uncontrolled 12/03/2007  . HYPOTHYROIDISM 11/12/2007  . HYPERCHOLESTEROLEMIA 11/12/2007  . MULTIPLE SCLEROSIS 08/29/2007  . PROLIFERATIVE DIABETIC RETINOPATHY 08/29/2007  . HYPERTENSION 12/25/2006    [name], PT, DPT 09/21/17    3:29 PM    [facility] [address] [place], [place], [address] Phone: [phone]   Fax:  [phone]  Name: Johnathan Ross MRN: [id number] Date of Birth: [birth year]

## 2017-09-23 ENCOUNTER — Ambulatory Visit: Payer: Medicare Other | Admitting: Physical Therapy

## 2017-09-23 ENCOUNTER — Encounter: Payer: Self-pay | Admitting: Physical Therapy

## 2017-09-23 DIAGNOSIS — M21371 Foot drop, right foot: Secondary | ICD-10-CM

## 2017-09-23 DIAGNOSIS — M21372 Foot drop, left foot: Secondary | ICD-10-CM

## 2017-09-23 DIAGNOSIS — M6281 Muscle weakness (generalized): Secondary | ICD-10-CM

## 2017-09-23 DIAGNOSIS — R262 Difficulty in walking, not elsewhere classified: Secondary | ICD-10-CM

## 2017-09-23 DIAGNOSIS — R2681 Unsteadiness on feet: Secondary | ICD-10-CM | POA: Diagnosis not present

## 2017-09-23 NOTE — Therapy (Signed)
[facility] [address] [place], [place], [address] Phone: [phone]   Fax:  [phone]  Physical Therapy Treatment  Patient Details  Name: Johnathan Ross MRN: [id number] Date of Birth: 04/08/1971 Referring Provider: Altamese [place], MD   Encounter Date: 09/23/2017  PT End of Session - 09/23/17 0850    Visit Number  50    Number of Visits  65    Date for PT Re-Evaluation  10/20/17    Authorization Type  Medicare    PT Start Time  0845    PT Stop Time  0927    PT Time Calculation (min)  42 min    Equipment Utilized During Treatment  Other (comment);Gait belt Bioness    Activity Tolerance  Patient tolerated treatment well    Behavior During Therapy  [facility] for tasks assessed/performed       Past Medical History:  Diagnosis Date  . Hyperlipidemia   . Hypertension   . Hypogonadism in male 11/01/2016  . Hypothyroidism   . Multiple sclerosis ([place])   . 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 Type 1 diabetes mellitus ([place]) dx'd 1994  . Vitamin D insufficiency 11/29/2016    Past Surgical History:  Procedure Laterality Date  . EYE SURGERY Bilateral    "laser for diabetic retinopathy"  . FRACTURE SURGERY    . OPEN REDUCTION INTERNAL FIXATION (ORIF) TIBIA/FIBULA FRACTURE Right 10/30/2016  . ORIF ANKLE FRACTURE Left 2015  . ORIF TIBIA FRACTURE Right 10/30/2016   Procedure: OPEN REDUCTION INTERNAL FIXATION (ORIF) TIBIA FIBULA FRACTURE;  Surgeon: Altamese [place], MD;  Location: [facility];  Service: Orthopedics;  Laterality: Right;  . RETINAL LASER PROCEDURE Bilateral    "for diabetic retinopathy"    There were no vitals filed for this visit.  Subjective Assessment - 09/23/17 0847    Subjective  No new complaints. No falls or pain to report.     Patient is accompained by:  Family member    Pertinent History  MS dx about 6 years ago    How long can you sit comfortably?  unlimited    How long can you stand  comfortably?  unlimited    How long can you walk comfortably?  household distances due to LE fatigue/weakness    Patient Stated Goals  Walk without AD;  On 06/17/17 patient states "for me, coming off of that (RW) is how to measure improvement".     Currently in Pain?  No/denies         [facility] Adult PT Treatment/Exercise - 09/23/17 0853      Transfers   Transfers  Sit to Stand;Stand to Sit    Sit to Stand  5: Supervision;With upper extremity assist    Stand to Sit  5: Supervision;With upper extremity assist      Ambulation/Gait   Ambulation/Gait  Yes    Ambulation/Gait Assistance  4: Min guard    Ambulation/Gait Assistance Details  cues on posture, step length and cane placement.    Ambulation Distance (Feet)  50 Feet x2    Assistive device  Straight cane;Other (Comment) Bioness    Gait Pattern  Step-through pattern;Right genu recurvatum;Poor foot clearance - right;Lateral hip instability    Ambulation Surface  Level;Indoor      Neuro Re-ed    Neuro Re-ed Details   for strengthening/balance reactions/muscle re-ed: in parallel bars standing across red beam: alternating fwd heel taps, then alternating bwd toe taps with little to no UE support, cues on weight  shifting and posture needed. static standing with feet hip width apart- PTA provided gentle, sustained  pertubations in varied directions for core/LE strengthening and to work on stepping strategy. UE support on bars to catch balance at times along with up to min assist for balance; in staggered (modified tandem): sustained pertubations in varied directions with each foot forward, touch to bars for balance with min assist needed at times.; dynamic balance: side stepping in squat position x 3 laps each way, fwd/bwd walking in squat position x 3 laps each way. no UE support with up to min assist for balance and occasional touch to bars for balance; forward reciprocal stepping over 4 bolsters of varied heights: 4 laps fwd with intermittent touch  to bars for balance, min guard assist.       Electrical Stimulation   Electrical Stimulation Location  bilat anterior tibialis and R hamstring    Electrical Stimulation Action  for improved bil DF, improved stance control of knee on right, knee flexion assistance with swing phase    Electrical Stimulation Parameters  refer to tablet 1 for adjusted parameters; steering electrodes    Electrical Stimulation Goals  Strength;Neuromuscular facilitation               PT Short Term Goals - 09/17/17 1304      PT SHORT TERM GOAL #1   Title  Pt wil participate in AFO trial with [employer] and initiate training with PT for improved safety during home and community ambulation    Time  4    Period  Weeks    Status  Revised    Target Date  09/26/17      PT SHORT TERM GOAL #2   Title  Pt will ambulate 230' on indoor, level surfaces with cane and appropriate AFO with min A    Time  4    Period  Weeks    Status  Revised    Target Date  09/26/17      PT SHORT TERM GOAL #5   Title  Pt will improve standing balance as indicated by increase in BERG balance score to > or = 50/56    Baseline  44/56    Time  4    Period  Weeks    Status  Revised 44/56    Target Date  09/26/17      PT SHORT TERM GOAL #6   Title  Pt will improve safety with gait as indicated by increase in gait velocity to >/= 2.39ft/sec with LRAD and AFO    Baseline  2.17 - no AFO, RW    Time  4    Period  Weeks    Status  Revised Pending AFO    Target Date  09/26/17        PT Long Term Goals - 08/21/17 1425      PT LONG TERM GOAL #1   Title  Will be independent with HEP with standing HEP and Bioness HEP if approved    Time  8    Period  Weeks    Status  Revised    Target Date  10/20/17      PT LONG TERM GOAL #2   Title  Pt will ambulate x 115' indoors with LRAD and AFO with supervision; ambulate 150' outside on pavement and negotiate 4 stairs with one rail, cane, AFO and min A    Baseline  50' with simulated AFO,  cane and mod A    Time  8    Period  Weeks    Status  Revised    Target Date  10/20/17      PT LONG TERM GOAL #3   Title  Pt will demonstrate independence with Bioness home unit (donning/doffing, wear schedule, skin care, care of components)    Baseline  Awaiting approval    Time  8    Period  Weeks    Status  New    Target Date  10/20/17      PT LONG TERM GOAL #5   Title  Pt will decrease falls risk as indicated by increase in BERG score to > or = 52/56    Baseline  44/56    Time  8    Period  Weeks    Status  Revised    Target Date  10/20/17      PT LONG TERM GOAL #7   Title  Pt will improve gait velocity to > or = 2.8 ft/sec with LRAD and appropriate AFO     Baseline  2.17 ft/sec with RW, no AFO    Time  8    Period  Weeks    Status  Revised    Target Date  10/20/17            Plan - 09/23/17 0850    Clinical Impression Statement  Today's skilled session continued with use of Bioness with gait and balance activities without any issues. Pt demonstrated increased activity tolerance with fewer rest breaks needed today and overall less assistance needed for balance. Pt is progressing toward goals and should benefit from continued PT to progress toward unmet goals.                Rehab Potential  Good    Clinical Impairments Affecting Rehab Potential  MS     PT Frequency  2x / week    PT Duration  8 weeks    PT Treatment/Interventions  ADLs/Self Care Home Management;Cryotherapy;Vasopneumatic Device;Taping;Gait training;Stair training;Functional mobility training;Therapeutic activities;Therapeutic exercise;Balance training;Neuromuscular re-education;Passive range of motion;Manual techniques;Patient/family education;Electrical Stimulation;DME Instruction;Orthotic Fit/Training;Energy conservation    PT Next Visit Plan  Assess STG when he receives AFO on 5/2; Continue Stepping strategy training using sports cord for perturbations-vary surfaces; during gait if you have +2.   Continue proximal hip/core strengthening with Bioness - tandem stand, side step with squat, tall kneeling and half kneel, closed chain hip ABD (tap downs laterally).  Gait with Bioness.      Consulted and Agree with Plan of Care  Patient    Family Member Consulted  mom       Patient will benefit from skilled therapeutic intervention in order to improve the following deficits and impairments:  Abnormal gait, Pain, Decreased range of motion, Decreased balance, Decreased mobility, Difficulty walking, Decreased activity tolerance, Decreased strength, Decreased endurance, Impaired flexibility, Impaired sensation  Visit Diagnosis: Difficulty in walking, not elsewhere classified  Muscle weakness (generalized)  Unsteadiness on feet  Foot drop, right  Foot drop, left     Problem List Patient Active Problem List   Diagnosis Date Noted  . Vitamin D insufficiency 11/29/2016  . Hypogonadism in male 11/01/2016  . Type 1 diabetes mellitus ([place])   . Hypothyroidism   . Hypertension   . Hyperlipidemia   . Closed fracture of right fibula and tibia 10/29/2016  . Closed tibia fracture 10/29/2016  . Closed fracture of right tibial plafond with fibula involvement 10/29/2016  . Abnormal liver function tests 03/06/2013  . RASH  AND OTHER NONSPECIFIC SKIN ERUPTION 10/07/2009  . SHOULDER PAIN, LEFT 12/27/2008  . Type I (juvenile type) diabetes mellitus without mention of complication, uncontrolled 12/03/2007  . HYPOTHYROIDISM 11/12/2007  . HYPERCHOLESTEROLEMIA 11/12/2007  . MULTIPLE SCLEROSIS 08/29/2007  . PROLIFERATIVE DIABETIC RETINOPATHY 08/29/2007  . HYPERTENSION 12/25/2006    Willow Ora, PTA, [facility] [address] [place], [place] [address] [phone] 09/23/17, 5:05 PM   Name: Johnathan Ross MRN: [id number] Date of Birth: October 31, 1970

## 2017-09-26 ENCOUNTER — Ambulatory Visit: Payer: Medicare Other | Attending: Orthopedic Surgery | Admitting: Physical Therapy

## 2017-09-26 DIAGNOSIS — M21372 Foot drop, left foot: Secondary | ICD-10-CM | POA: Diagnosis not present

## 2017-09-26 DIAGNOSIS — R262 Difficulty in walking, not elsewhere classified: Secondary | ICD-10-CM | POA: Diagnosis not present

## 2017-09-26 DIAGNOSIS — R2681 Unsteadiness on feet: Secondary | ICD-10-CM | POA: Diagnosis not present

## 2017-09-26 DIAGNOSIS — M21371 Foot drop, right foot: Secondary | ICD-10-CM | POA: Diagnosis not present

## 2017-09-26 DIAGNOSIS — M6281 Muscle weakness (generalized): Secondary | ICD-10-CM | POA: Diagnosis not present

## 2017-09-26 NOTE — Therapy (Signed)
[facility] [address] [place], [place], [address] Phone: [phone]   Fax:  [phone]  Physical Therapy Treatment  Patient Details  Name: Johnathan Ross MRN: [id number] Date of Birth: 01/21/1971 Referring Provider: Altamese [place], MD   Encounter Date: 09/26/2017  PT End of Session - 09/26/17 1524    Visit Number  47    Number of Visits  52    Date for PT Re-Evaluation  10/20/17    Authorization Type  Medicare    PT Start Time  0850    PT Stop Time  0935    PT Time Calculation (min)  45 min    Equipment Utilized During Treatment  Other (comment) Bioness    Activity Tolerance  Patient tolerated treatment well    Behavior During Therapy  [facility] for tasks assessed/performed       Past Medical History:  Diagnosis Date  . Hyperlipidemia   . Hypertension   . Hypogonadism in male 11/01/2016  . Hypothyroidism   . Multiple sclerosis ([place])   . 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 Type 1 diabetes mellitus ([place]) dx'd 1994  . Vitamin D insufficiency 11/29/2016    Past Surgical History:  Procedure Laterality Date  . EYE SURGERY Bilateral    "laser for diabetic retinopathy"  . FRACTURE SURGERY    . OPEN REDUCTION INTERNAL FIXATION (ORIF) TIBIA/FIBULA FRACTURE Right 10/30/2016  . ORIF ANKLE FRACTURE Left 2015  . ORIF TIBIA FRACTURE Right 10/30/2016   Procedure: OPEN REDUCTION INTERNAL FIXATION (ORIF) TIBIA FIBULA FRACTURE;  Surgeon: Altamese [place], MD;  Location: [facility];  Service: Orthopedics;  Laterality: Right;  . RETINAL LASER PROCEDURE Bilateral    "for diabetic retinopathy"    There were no vitals filed for this visit.  Subjective Assessment - 09/26/17 0902    Subjective  Walking with cane today; had to reschedule AFO fitting for Monday during therapy.    Patient is accompained by:  Family member    Pertinent History  MS dx about 6 years ago    How long can you sit comfortably?  unlimited    How long  can you stand comfortably?  unlimited    How long can you walk comfortably?  household distances due to LE fatigue/weakness    Patient Stated Goals  Walk without AD;  On 06/17/17 patient states "for me, coming off of that (RW) is how to measure improvement".     Currently in Pain?  No/denies         [facility] PT Assessment - 09/26/17 0903      Standardized Balance Assessment   Standardized Balance Assessment  Berg Balance Test      Berg Balance Test   Sit to Stand  Able to stand without using hands and stabilize independently    Standing Unsupported  Able to stand safely 2 minutes    Sitting with Back Unsupported but Feet Supported on Floor or Stool  Able to sit safely and securely 2 minutes    Stand to Sit  Sits safely with minimal use of hands    Transfers  Able to transfer safely, minor use of hands    Standing Unsupported with Eyes Closed  Able to stand 10 seconds with supervision    Standing Ubsupported with Feet Together  Able to place feet together independently and stand for 1 minute with supervision    From Standing, Reach Forward with Outstretched Arm  Can reach confidently >25 cm (10")  From Standing Position, Pick up Object from [place] to pick up shoe safely and easily    From Standing Position, Turn to Look Behind Over each Shoulder  Looks behind one side only/other side shows less weight shift    Turn 360 Degrees  Able to turn 360 degrees safely but slowly    Standing Unsupported, Alternately Place Feet on Step/Stool  Able to complete 4 steps without aid or supervision    Standing Unsupported, One Foot in Front  Able to plae foot ahead of the other independently and hold 30 seconds    Standing on One Leg  Able to lift leg independently and hold 5-10 seconds    Total Score  47    Berg comment:  47/56       Patient demonstrates increased fall risk as noted by score of  47/56 on Berg Balance Scale.  (<36= high risk for falls, close to 100%; 37-45 significant >80%; 46-51  moderate >50%; 52-55 lower >25%)    [facility] Adult PT Treatment/Exercise - 09/26/17 1610      Ambulation/Gait   Ambulation/Gait  Yes    Ambulation/Gait Assistance  4: Min assist    Ambulation/Gait Assistance Details  pt arrived with cane; performed gait to/from treatment area with therapist providing min A for balance in more crowded environment and as pt fatigued and experienced R foot drag and LOB    Ambulation Distance (Feet)  75 Feet    Assistive device  Straight cane    Gait Pattern  Step-through pattern;Right genu recurvatum;Poor foot clearance - right;Lateral hip instability    Ambulation Surface  Level;Indoor      Knee/Hip Exercises: Standing   SLS  with Bioness in training mode x 10-15 seconds with pt alternating placing one foot on step and then releasing UE support on chair and foot support on block x 10 seconds at a time to train single limb stance, min-mod A to maintain trunk control       Modalities   Modalities  Electrical Stimulation      Electrical Stimulation   Electrical Stimulation Location  bilat anterior tibialis and R hamstring    Electrical Stimulation Action  open and closed chain bilat ankle DF, RLE hamstring knee flexion and hip extension    Electrical Stimulation Parameters  adjusted settings saved in tablet 1; steering electrode    Electrical Stimulation Goals  Strength;Neuromuscular facilitation             PT Education - 09/26/17 1524    Education provided  Yes    Education Details  progress with balance, plan for Monday    Person(s) Educated  Patient    Methods  Explanation    Comprehension  Verbalized understanding       PT Short Term Goals - 09/26/17 9604      PT SHORT TERM GOAL #1   Title  Pt wil participate in AFO trial with [employer] and initiate training with PT for improved safety during home and community ambulation    Time  4    Period  Weeks    Status  Achieved      PT SHORT TERM GOAL #2   Title  Pt will ambulate 230' on indoor,  level surfaces with cane and appropriate AFO with min A    Time  4    Period  Weeks    Status  Revised      PT SHORT TERM GOAL #5   Title  Pt will  improve standing balance as indicated by increase in BERG balance score to > or = 50/56    Baseline  44/56 > 47/56    Time  4    Period  Weeks    Status  Partially Met 44/56      PT SHORT TERM GOAL #6   Title  Pt will improve safety with gait as indicated by increase in gait velocity to >/= 2.79f/sec with LRAD and AFO    Baseline  2.17 - no AFO, RW    Time  4    Period  Weeks    Status  Revised Pending AFO        PT Long Term Goals - 09/26/17 1531      PT LONG TERM GOAL #1   Title  Will be independent with HEP with standing HEP and Bioness HEP if approved    Time  8    Period  Weeks    Status  Revised    Target Date  10/20/17      PT LONG TERM GOAL #2   Title  Pt will ambulate x 115' indoors with LRAD and AFO with supervision; ambulate 150' outside on pavement and negotiate 4 stairs with one rail, cane, AFO and min A    Baseline  50' with simulated AFO, cane and mod A    Time  8    Period  Weeks    Status  Revised    Target Date  10/20/17      PT LONG TERM GOAL #3   Title  Pt will demonstrate independence with Bioness home unit (donning/doffing, wear schedule, skin care, care of components)    Baseline  Awaiting approval    Time  8    Period  Weeks    Status  New    Target Date  10/20/17      PT LONG TERM GOAL #5   Title  Pt will decrease falls risk as indicated by increase in BERG score to > or = 50/56    Baseline  47/56    Time  8    Period  Weeks    Status  Revised    Target Date  10/20/17      PT LONG TERM GOAL #7   Title  Pt will improve gait velocity to > or = 2.8 ft/sec with LRAD and appropriate AFO     Baseline  2.17 ft/sec with RW, no AFO    Time  8    Period  Weeks    Status  Revised    Target Date  10/20/17            Plan - 09/26/17 1533    Clinical Impression Statement  Unable to obtain  AFO for today's session; only able to assess BERG STG today without AFO but did utilize Bioness for increased ankle DF and hamstring activation during testing.  Pt's score increased to 47/56; pt falls risk decreased to moderate.  Pt continues to have difficulty maintaining trunk control and weight shift in single limb stance; continued to focus on single limb stance training and gait with cane.  Pt to receive AFO on Monday; will complete STG assessment on Monday with AFO.    Rehab Potential  Good    Clinical Impairments Affecting Rehab Potential  MS     PT Frequency  2x / week    PT Duration  8 weeks    PT Treatment/Interventions  ADLs/Self Care Home Management;Cryotherapy;Vasopneumatic Device;Taping;Gait training;Stair  training;Functional mobility training;Therapeutic activities;Therapeutic exercise;Balance training;Neuromuscular re-education;Passive range of motion;Manual techniques;Patient/family education;Electrical Stimulation;DME Instruction;Orthotic Fit/Training;Energy conservation    PT Next Visit Plan  Assess STG when he receives AFO on 5/6; Continue Stepping strategy training using sports cord for perturbations-vary surfaces; during gait if you have +2.  Continue proximal hip/core strengthening with Bioness - tandem stand, side step with squat, tall kneeling and half kneel, closed chain hip ABD (tap downs laterally).  Gait with Bioness.      Consulted and Agree with Plan of Care  Patient    Family Member Consulted  mom       Patient will benefit from skilled therapeutic intervention in order to improve the following deficits and impairments:  Abnormal gait, Pain, Decreased range of motion, Decreased balance, Decreased mobility, Difficulty walking, Decreased activity tolerance, Decreased strength, Decreased endurance, Impaired flexibility, Impaired sensation  Visit Diagnosis: Difficulty in walking, not elsewhere classified  Muscle weakness (generalized)  Unsteadiness on feet  Foot drop,  right  Foot drop, left     Problem List Patient Active Problem List   Diagnosis Date Noted  . Vitamin D insufficiency 11/29/2016  . Hypogonadism in male 11/01/2016  . Type 1 diabetes mellitus ([place])   . Hypothyroidism   . Hypertension   . Hyperlipidemia   . Closed fracture of right fibula and tibia 10/29/2016  . Closed tibia fracture 10/29/2016  . Closed fracture of right tibial plafond with fibula involvement 10/29/2016  . Abnormal liver function tests 03/06/2013  . RASH AND OTHER NONSPECIFIC SKIN ERUPTION 10/07/2009  . SHOULDER PAIN, LEFT 12/27/2008  . Type I (juvenile type) diabetes mellitus without mention of complication, uncontrolled 12/03/2007  . HYPOTHYROIDISM 11/12/2007  . HYPERCHOLESTEROLEMIA 11/12/2007  . MULTIPLE SCLEROSIS 08/29/2007  . PROLIFERATIVE DIABETIC RETINOPATHY 08/29/2007  . HYPERTENSION 12/25/2006    [name], PT, DPT 09/26/17    3:50 PM    [facility] [address] [place], [place], [address] Phone: [phone]   Fax:  [phone]  Name: ZAKEE DEERMAN MRN: [id number] Date of Birth: 1971-02-27

## 2017-09-30 ENCOUNTER — Ambulatory Visit: Payer: Medicare Other | Admitting: Physical Therapy

## 2017-09-30 ENCOUNTER — Encounter: Payer: Self-pay | Admitting: Physical Therapy

## 2017-09-30 DIAGNOSIS — M6281 Muscle weakness (generalized): Secondary | ICD-10-CM | POA: Diagnosis not present

## 2017-09-30 DIAGNOSIS — M21372 Foot drop, left foot: Secondary | ICD-10-CM

## 2017-09-30 DIAGNOSIS — M21371 Foot drop, right foot: Secondary | ICD-10-CM | POA: Diagnosis not present

## 2017-09-30 DIAGNOSIS — R2681 Unsteadiness on feet: Secondary | ICD-10-CM

## 2017-09-30 DIAGNOSIS — R262 Difficulty in walking, not elsewhere classified: Secondary | ICD-10-CM | POA: Diagnosis not present

## 2017-09-30 NOTE — Therapy (Signed)
[facility] [address] [place], [place], [address] Phone: [phone]   Fax:  [phone]  Physical Therapy Treatment  Patient Details  Name: Johnathan Ross MRN: [id number] Date of Birth: Jun 04, 1970 Referring Provider: Altamese [place], MD   Encounter Date: 09/30/2017  PT End of Session - 09/30/17 1213    Visit Number  41    Number of Visits  59    Date for PT Re-Evaluation  10/20/17    Authorization Type  Medicare    PT Start Time  0850    PT Stop Time  0936    PT Time Calculation (min)  46 min    Equipment Utilized During Treatment  Other (comment) Bioness    Activity Tolerance  Patient tolerated treatment well    Behavior During Therapy  [facility] for tasks assessed/performed       Past Medical History:  Diagnosis Date  . Hyperlipidemia   . Hypertension   . Hypogonadism in male 11/01/2016  . Hypothyroidism   . Multiple sclerosis ([place])   . 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 Type 1 diabetes mellitus ([place]) dx'd 1994  . Vitamin D insufficiency 11/29/2016    Past Surgical History:  Procedure Laterality Date  . EYE SURGERY Bilateral    "laser for diabetic retinopathy"  . FRACTURE SURGERY    . OPEN REDUCTION INTERNAL FIXATION (ORIF) TIBIA/FIBULA FRACTURE Right 10/30/2016  . ORIF ANKLE FRACTURE Left 2015  . ORIF TIBIA FRACTURE Right 10/30/2016   Procedure: OPEN REDUCTION INTERNAL FIXATION (ORIF) TIBIA FIBULA FRACTURE;  Surgeon: Altamese [place], MD;  Location: [facility];  Service: Orthopedics;  Laterality: Right;  . RETINAL LASER PROCEDURE Bilateral    "for diabetic retinopathy"    There were no vitals filed for this visit.  Subjective Assessment - 09/30/17 0854    Subjective  Pt ambulating with the cane again today; girlfriend present with pt.  Girlfriend reports that they went out to eat this weekend and pt fell.  Feels like he tried to do too much that day.  Pt reports doing a lot of housework that day but "I  still shouldn't have fell."    Patient is accompained by:  Family member    Pertinent History  MS dx about 6 years ago    How long can you sit comfortably?  unlimited    How long can you stand comfortably?  unlimited    How long can you walk comfortably?  household distances due to LE fatigue/weakness    Patient Stated Goals  Walk without AD;  On 06/17/17 patient states "for me, coming off of that (RW) is how to measure improvement".                        [facility] Adult PT Treatment/Exercise - 09/30/17 1203      Ambulation/Gait   Ambulation/Gait  Yes    Ambulation/Gait Assistance  4: Min assist    Ambulation/Gait Assistance Details  x 115' x 3 reps with use of Bioness in gait mode initially with one cane but pt demonstrated increased fatigue today, increased recurvatum RLE, increased LOB and increased trunk lean.  Switched to use of bilat canes to continue to facilitate reciprocal arm swing but with downward pressure through cane on R for increased trunk control and balance; pt did not require standing rest breaks with use of bilat canes and did not experience LOB.  Also provided verbal cues for slightly shorter step length RLE  to allow more forward weight shift over RLE and facilitation of weight shift forwards and to the R in R stance    Ambulation Distance (Feet)  345 Feet    Assistive device  Straight cane    Gait Pattern  Step-through pattern;Decreased dorsiflexion - right;Decreased weight shift to right;Right genu recurvatum;Lateral hip instability;Poor foot clearance - right    Ambulation Surface  Level;Indoor    Ramp  4: Min assist    Ramp Details (indicate cue type and reason)  with canes and with Bioness on in gait mode and then off performing x 5 reps up/down ramp with focus on increased hamstring and DF activation to control weight shift and minimize R genu recurvatum      Therapeutic Activites    Therapeutic Activities  Other Therapeutic Activities    Other  Therapeutic Activities  Pt asking questions about adjustment of Bioness parameters with home unit. Discussed slow progression of wear schedule and adjustment of parameters with home control unit based on day to day changes in LE edema, fatigue, etc.  Also discussed adjustment of parameters as natural motor control and activation improved.  Also discussed safety with gait in the community as pt transitions to cane as primary AD; for longer distances PT is recommending bilat UE support (RW, second cane or family member) to prevent fall with injury.  Pt verbalized understanding but is afraid that use of RW or external support will set him back.  Discussed how another fracture would set him back even farther.  Girlfriend attempted to reiterate importance of support and falls prevention.      Modalities   Modalities  Teacher, English as a foreign language Location  bilat anterior tibialis and R hamstring    Electrical Stimulation Action  closed chain and open chain bilat ankle DF, RLE knee flexion and hip extension    Electrical Stimulation Parameters  see saved parameters in Tablet 1, bilat steering electrodes    Electrical Stimulation Goals  Strength;Neuromuscular facilitation             PT Education - 09/30/17 1212    Education provided  Yes    Education Details  see TA; will contact [employer] to verify again appointment for AFO fitting    Person(s) Educated  Patient    Methods  Explanation    Comprehension  Verbalized understanding       PT Short Term Goals - 09/26/17 8850      PT SHORT TERM GOAL #1   Title  Pt wil participate in AFO trial with [facility] and initiate training with PT for improved safety during home and community ambulation    Time  4    Period  Weeks    Status  Achieved      PT SHORT TERM GOAL #2   Title  Pt will ambulate 230' on indoor, level surfaces with cane and appropriate AFO with min A    Time  4    Period  Weeks    Status   Revised      PT SHORT TERM GOAL #5   Title  Pt will improve standing balance as indicated by increase in BERG balance score to > or = 50/56    Baseline  44/56 > 47/56    Time  4    Period  Weeks    Status  Partially Met 44/56      PT SHORT TERM GOAL #6   Title  Pt will improve safety with gait as indicated by increase in gait velocity to >/= 2.33f/sec with LRAD and AFO    Baseline  2.17 - no AFO, RW    Time  4    Period  Weeks    Status  Revised Pending AFO        PT Long Term Goals - 09/26/17 1531      PT LONG TERM GOAL #1   Title  Will be independent with HEP with standing HEP and Bioness HEP if approved    Time  8    Period  Weeks    Status  Revised    Target Date  10/20/17      PT LONG TERM GOAL #2   Title  Pt will ambulate x 115' indoors with LRAD and AFO with supervision; ambulate 150' outside on pavement and negotiate 4 stairs with one rail, cane, AFO and min A    Baseline  50' with simulated AFO, cane and mod A    Time  8    Period  Weeks    Status  Revised    Target Date  10/20/17      PT LONG TERM GOAL #3   Title  Pt will demonstrate independence with Bioness home unit (donning/doffing, wear schedule, skin care, care of components)    Baseline  Awaiting approval    Time  8    Period  Weeks    Status  New    Target Date  10/20/17      PT LONG TERM GOAL #5   Title  Pt will decrease falls risk as indicated by increase in BERG score to > or = 50/56    Baseline  47/56    Time  8    Period  Weeks    Status  Revised    Target Date  10/20/17      PT LONG TERM GOAL #7   Title  Pt will improve gait velocity to > or = 2.8 ft/sec with LRAD and appropriate AFO     Baseline  2.17 ft/sec with RW, no AFO    Time  8    Period  Weeks    Status  Revised    Target Date  10/20/17            Plan - 09/30/17 1214    Clinical Impression Statement  Orthotist did not arrive during today's session with AFO for fitting so unable to initiate training and assess STG;  continued gait training with Bioness on level and then inclined surfaces.  Pt more fatigued today but also reports very active/busy weekend with one fall while out in the community. Continued to discuss importance of safe transition to cane in the community and recommendations to prevent future falls and injury.  Will verify appointment for fitting and will continue to progress towards goals.    Rehab Potential  Good    Clinical Impairments Affecting Rehab Potential  MS     PT Frequency  2x / week    PT Duration  8 weeks    PT Treatment/Interventions  ADLs/Self Care Home Management;Cryotherapy;Vasopneumatic Device;Taping;Gait training;Stair training;Functional mobility training;Therapeutic activities;Therapeutic exercise;Balance training;Neuromuscular re-education;Passive range of motion;Manual techniques;Patient/family education;Electrical Stimulation;DME Instruction;Orthotic Fit/Training;Energy conservation    PT Next Visit Plan  Assess STG when he receives AFO, Continue Stepping strategy training using sports cord for perturbations-vary surfaces; during gait if you have +2.  Continue proximal hip/core strengthening with Bioness - tandem stand, side step with squat,  tall kneeling and half kneel, closed chain hip ABD (tap downs laterally).  Gait with Bioness.      Consulted and Agree with Plan of Care  Patient;Other (Comment)    Family Member Consulted  girlfriend       Patient will benefit from skilled therapeutic intervention in order to improve the following deficits and impairments:  Abnormal gait, Pain, Decreased range of motion, Decreased balance, Decreased mobility, Difficulty walking, Decreased activity tolerance, Decreased strength, Decreased endurance, Impaired flexibility, Impaired sensation  Visit Diagnosis: Difficulty in walking, not elsewhere classified  Muscle weakness (generalized)  Unsteadiness on feet  Foot drop, right  Foot drop, left     Problem List Patient Active  Problem List   Diagnosis Date Noted  . Vitamin D insufficiency 11/29/2016  . Hypogonadism in male 11/01/2016  . Type 1 diabetes mellitus ([place])   . Hypothyroidism   . Hypertension   . Hyperlipidemia   . Closed fracture of right fibula and tibia 10/29/2016  . Closed tibia fracture 10/29/2016  . Closed fracture of right tibial plafond with fibula involvement 10/29/2016  . Abnormal liver function tests 03/06/2013  . RASH AND OTHER NONSPECIFIC SKIN ERUPTION 10/07/2009  . SHOULDER PAIN, LEFT 12/27/2008  . Type I (juvenile type) diabetes mellitus without mention of complication, uncontrolled 12/03/2007  . HYPOTHYROIDISM 11/12/2007  . HYPERCHOLESTEROLEMIA 11/12/2007  . MULTIPLE SCLEROSIS 08/29/2007  . PROLIFERATIVE DIABETIC RETINOPATHY 08/29/2007  . HYPERTENSION 12/25/2006    [name], PT, DPT 09/30/17    12:18 PM    [facility] [address] [place], [place], [address] Phone: [phone]   Fax:  [phone]  Name: LORENZO ARSCOTT MRN: [id number] Date of Birth: [birth year]

## 2017-10-03 ENCOUNTER — Encounter: Payer: Self-pay | Admitting: Physical Therapy

## 2017-10-03 ENCOUNTER — Ambulatory Visit: Payer: Medicare Other | Admitting: Physical Therapy

## 2017-10-03 DIAGNOSIS — M21372 Foot drop, left foot: Secondary | ICD-10-CM | POA: Diagnosis not present

## 2017-10-03 DIAGNOSIS — R2681 Unsteadiness on feet: Secondary | ICD-10-CM | POA: Diagnosis not present

## 2017-10-03 DIAGNOSIS — R262 Difficulty in walking, not elsewhere classified: Secondary | ICD-10-CM | POA: Diagnosis not present

## 2017-10-03 DIAGNOSIS — M21371 Foot drop, right foot: Secondary | ICD-10-CM

## 2017-10-03 DIAGNOSIS — M6281 Muscle weakness (generalized): Secondary | ICD-10-CM

## 2017-10-03 NOTE — Therapy (Signed)
[facility] [address] [place], [place], [address] Phone: [phone]   Fax:  [phone]  Physical Therapy Treatment  Patient Details  Name: Johnathan Ross MRN: [id number] Date of Birth: 05/16/1971 Referring Provider: Altamese [place], MD   Encounter Date: 10/03/2017  PT End of Session - 10/03/17 1440    Visit Number  74    Number of Visits  22    Date for PT Re-Evaluation  10/20/17    Authorization Type  Medicare    PT Start Time  0851    PT Stop Time  0931    PT Time Calculation (min)  40 min    Activity Tolerance  Patient tolerated treatment well    Behavior During Therapy  [facility] for tasks assessed/performed       Past Medical History:  Diagnosis Date  . Hyperlipidemia   . Hypertension   . Hypogonadism in male 11/01/2016  . Hypothyroidism   . Multiple sclerosis ([place])   . 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 Type 1 diabetes mellitus ([place]) dx'd 1994  . Vitamin D insufficiency 11/29/2016    Past Surgical History:  Procedure Laterality Date  . EYE SURGERY Bilateral    "laser for diabetic retinopathy"  . FRACTURE SURGERY    . OPEN REDUCTION INTERNAL FIXATION (ORIF) TIBIA/FIBULA FRACTURE Right 10/30/2016  . ORIF ANKLE FRACTURE Left 2015  . ORIF TIBIA FRACTURE Right 10/30/2016   Procedure: OPEN REDUCTION INTERNAL FIXATION (ORIF) TIBIA FIBULA FRACTURE;  Surgeon: Altamese [place], MD;  Location: [facility];  Service: Orthopedics;  Laterality: Right;  . RETINAL LASER PROCEDURE Bilateral    "for diabetic retinopathy"    There were no vitals filed for this visit.  Subjective Assessment - 10/03/17 0854    Subjective  "I didn't walk this way coming in from the car".  [facility] called to confirm AFO fitting today.  Mother with pt today.    Patient is accompained by:  Family member    Pertinent History  MS dx about 6 years ago    How long can you sit comfortably?  unlimited    How long can you stand comfortably?   unlimited    How long can you walk comfortably?  household distances due to LE fatigue/weakness    Patient Stated Goals  Walk without AD;  On 06/17/17 patient states "for me, coming off of that (RW) is how to measure improvement".     Currently in Pain?  No/denies         [facility] PT Assessment - 10/03/17 0906      Berg Balance Test   Sit to Stand  Able to stand without using hands and stabilize independently    Standing Unsupported  Able to stand safely 2 minutes    Sitting with Back Unsupported but Feet Supported on Floor or Stool  Able to sit safely and securely 2 minutes    Stand to Sit  Sits safely with minimal use of hands    Transfers  Able to transfer safely, minor use of hands    Standing Unsupported with Eyes Closed  Able to stand 10 seconds with supervision    Standing Ubsupported with Feet Together  Able to place feet together independently and stand for 1 minute with supervision    Standing Unsupported, One Foot in Front  Able to plae foot ahead of the other independently and hold 30 seconds  [facility] Adult PT Treatment/Exercise - 10/03/17 0906      Ambulation/Gait   Ambulation/Gait  Yes    Ambulation/Gait Assistance  4: Min assist    Ambulation/Gait Assistance Details  fit with new R AFO; performed gait around gym x 2 reps with therapist providing verbal, tactile and visual cues for increased LLE ABD, to shorten RLE step length, and to bring trunk more upright.  Also demonstrated how to keep cane closer to body to prevent leaning over L side    Ambulation Distance (Feet)  230 Feet    Assistive device  Straight cane    Gait Pattern  Step-through pattern;Decreased hip/knee flexion - right;Decreased weight shift to right;Lateral trunk lean to left;Narrow base of support;Poor foot clearance - right    Ambulation Surface  Level;Indoor      Therapeutic Activites    Therapeutic Activities  ADL's    ADL's  donning new AFO, skin check on bottom of foot  each night due to removal of shoe insole             PT Education - 10/03/17 1439    Education provided  Yes    Education Details  new AFO, donning, skin checks     Person(s) Educated  Patient    Methods  Explanation;Demonstration    Comprehension  Verbalized understanding;Returned demonstration       PT Short Term Goals - 10/03/17 1442      PT SHORT TERM GOAL #1   Title  Pt wil participate in AFO trial with [facility] and initiate training with PT for improved safety during home and community ambulation    Time  4    Period  Weeks    Status  Achieved      PT SHORT TERM GOAL #2   Title  Pt will ambulate 230' on indoor, level surfaces with cane and appropriate AFO with min A    Time  4    Period  Weeks    Status  Achieved      PT SHORT TERM GOAL #5   Title  Pt will improve standing balance as indicated by increase in BERG balance score to > or = 50/56    Baseline  44/56 > 47/56    Time  4    Period  Weeks    Status  Partially Met 44/56      PT SHORT TERM GOAL #6   Title  Pt will improve safety with gait as indicated by increase in gait velocity to >/= 2.41f/sec with LRAD and AFO    Baseline  2.17 - no AFO, RW    Time  4    Period  Weeks    Status  On-going Pending AFO        PT Long Term Goals - 09/26/17 1531      PT LONG TERM GOAL #1   Title  Will be independent with HEP with standing HEP and Bioness HEP if approved    Time  8    Period  Weeks    Status  Revised    Target Date  10/20/17      PT LONG TERM GOAL #2   Title  Pt will ambulate x 115' indoors with LRAD and AFO with supervision; ambulate 150' outside on pavement and negotiate 4 stairs with one rail, cane, AFO and min A    Baseline  50' with simulated AFO, cane and mod A    Time  8    Period  Weeks    Status  Revised    Target Date  10/20/17      PT LONG TERM GOAL #3   Title  Pt will demonstrate independence with Bioness home unit (donning/doffing, wear schedule, skin care, care of components)     Baseline  Awaiting approval    Time  8    Period  Weeks    Status  New    Target Date  10/20/17      PT LONG TERM GOAL #5   Title  Pt will decrease falls risk as indicated by increase in BERG score to > or = 50/56    Baseline  47/56    Time  8    Period  Weeks    Status  Revised    Target Date  10/20/17      PT LONG TERM GOAL #7   Title  Pt will improve gait velocity to > or = 2.8 ft/sec with LRAD and appropriate AFO     Baseline  2.17 ft/sec with RW, no AFO    Time  8    Period  Weeks    Status  Revised    Target Date  10/20/17            Plan - 10/03/17 1441    Clinical Impression Statement  Pt participated in fitting, gait training and education with new R AFO.  Due to pt feeling as if RLE was longer than L, removed insole from R shoe but educated on skin checks due to lack of barrier between foot and AFO plate.  As pt fatigued he continued to demonstrate R toe drag; continued to discuss addition of leather toe cap but pt declines for now.  Attempted various BERG tasks at end of session with AFO but pt demonstrated significant fatigue; will re-assess with AFO next session.  Pt will benefit from continued gait training with use of Bioness and new AFO with cane to improve safety and decrease falls risk.    Rehab Potential  Good    Clinical Impairments Affecting Rehab Potential  MS     PT Frequency  2x / week    PT Duration  8 weeks    PT Treatment/Interventions  ADLs/Self Care Home Management;Cryotherapy;Vasopneumatic Device;Taping;Gait training;Stair training;Functional mobility training;Therapeutic activities;Therapeutic exercise;Balance training;Neuromuscular re-education;Passive range of motion;Manual techniques;Patient/family education;Electrical Stimulation;DME Instruction;Orthotic Fit/Training;Energy conservation    PT Next Visit Plan  finish STG; Continue Stepping strategy training using sports cord for perturbations-vary surfaces; during gait if you have +2.  Continue  proximal hip/core strengthening with Bioness - tandem stand, side step with squat, tall kneeling and half kneel, closed chain hip ABD (tap downs laterally).  Gait with Bioness.      Consulted and Agree with Plan of Care  Patient;Family member/caregiver    Family Member Consulted  mother       Patient will benefit from skilled therapeutic intervention in order to improve the following deficits and impairments:  Abnormal gait, Pain, Decreased range of motion, Decreased balance, Decreased mobility, Difficulty walking, Decreased activity tolerance, Decreased strength, Decreased endurance, Impaired flexibility, Impaired sensation  Visit Diagnosis: Difficulty in walking, not elsewhere classified  Muscle weakness (generalized)  Unsteadiness on feet  Foot drop, right  Foot drop, left     Problem List Patient Active Problem List   Diagnosis Date Noted  . Vitamin D insufficiency 11/29/2016  . Hypogonadism in male 11/01/2016  . Type 1 diabetes mellitus ([place])   . Hypothyroidism   . Hypertension   .  Hyperlipidemia   . Closed fracture of right fibula and tibia 10/29/2016  . Closed tibia fracture 10/29/2016  . Closed fracture of right tibial plafond with fibula involvement 10/29/2016  . Abnormal liver function tests 03/06/2013  . RASH AND OTHER NONSPECIFIC SKIN ERUPTION 10/07/2009  . SHOULDER PAIN, LEFT 12/27/2008  . Type I (juvenile type) diabetes mellitus without mention of complication, uncontrolled 12/03/2007  . HYPOTHYROIDISM 11/12/2007  . HYPERCHOLESTEROLEMIA 11/12/2007  . MULTIPLE SCLEROSIS 08/29/2007  . PROLIFERATIVE DIABETIC RETINOPATHY 08/29/2007  . HYPERTENSION 12/25/2006    Johnathan Ross, PT, DPT 10/03/17    2:48 PM    [facility] [address] [place], [place], [address] Phone: [phone]   Fax:  [phone]  Name: Johnathan Ross MRN: [id number] Date of Birth: 31-Mar-1971

## 2017-10-07 ENCOUNTER — Ambulatory Visit: Payer: Medicare Other | Admitting: Physical Therapy

## 2017-10-07 DIAGNOSIS — R2681 Unsteadiness on feet: Secondary | ICD-10-CM

## 2017-10-07 DIAGNOSIS — M21371 Foot drop, right foot: Secondary | ICD-10-CM | POA: Diagnosis not present

## 2017-10-07 DIAGNOSIS — R262 Difficulty in walking, not elsewhere classified: Secondary | ICD-10-CM | POA: Diagnosis not present

## 2017-10-07 DIAGNOSIS — M6281 Muscle weakness (generalized): Secondary | ICD-10-CM

## 2017-10-07 DIAGNOSIS — M21372 Foot drop, left foot: Secondary | ICD-10-CM | POA: Diagnosis not present

## 2017-10-07 NOTE — Therapy (Signed)
[facility] [address] [place], [place], [address] Phone: [phone]   Fax:  [phone]  Physical Therapy Treatment  Patient Details  Name: Johnathan Ross MRN: [id number] Date of Birth: [birth year] Referring Provider: Altamese Hallwood, MD   Encounter Date: 10/07/2017  PT End of Session - 10/07/17 1350    Visit Number  71    Number of Visits  67    Date for PT Re-Evaluation  10/20/17    Authorization Type  Medicare    PT Start Time  0845    PT Stop Time  0933    PT Time Calculation (min)  48 min    Activity Tolerance  Patient tolerated treatment well    Behavior During Therapy  [facility] for tasks assessed/performed       Past Medical History:  Diagnosis Date  . Hyperlipidemia   . Hypertension   . Hypogonadism in male 11/01/2016  . Hypothyroidism   . Multiple sclerosis ([place])   . 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 Type 1 diabetes mellitus ([place]) dx'd 1994  . Vitamin D insufficiency 11/29/2016    Past Surgical History:  Procedure Laterality Date  . EYE SURGERY Bilateral    "laser for diabetic retinopathy"  . FRACTURE SURGERY    . OPEN REDUCTION INTERNAL FIXATION (ORIF) TIBIA/FIBULA FRACTURE Right 10/30/2016  . ORIF ANKLE FRACTURE Left 2015  . ORIF TIBIA FRACTURE Right 10/30/2016   Procedure: OPEN REDUCTION INTERNAL FIXATION (ORIF) TIBIA FIBULA FRACTURE;  Surgeon: Altamese [place], MD;  Location: [facility];  Service: Orthopedics;  Laterality: Right;  . RETINAL LASER PROCEDURE Bilateral    "for diabetic retinopathy"    There were no vitals filed for this visit.  Subjective Assessment - 10/07/17 0856    Subjective  Brought the walker today because of the rain, didn't want to slip and fall.  Has noticed a squeaking where the brace rubs in the shoe.    Patient is accompained by:  Family member    Pertinent History  MS dx about 6 years ago    How long can you sit comfortably?  unlimited    How long can you stand  comfortably?  unlimited    How long can you walk comfortably?  household distances due to LE fatigue/weakness    Patient Stated Goals  Walk without AD;  On 06/17/17 patient states "for me, coming off of that (RW) is how to measure improvement".     Currently in Pain?  No/denies         [facility] PT Assessment - 10/07/17 0846      Standardized Balance Assessment   Standardized Balance Assessment  Berg Balance Test;10 meter walk test    10 Meter Walk  12.97 seconds with cane and AFO or 2.54 ft/sec      Berg Balance Test   Sit to Stand  Able to stand without using hands and stabilize independently    Standing Unsupported  Able to stand safely 2 minutes    Sitting with Back Unsupported but Feet Supported on Floor or Stool  Able to sit safely and securely 2 minutes    Stand to Sit  Sits safely with minimal use of hands    Transfers  Able to transfer safely, minor use of hands    Standing Unsupported with Eyes Closed  Able to stand 10 seconds safely    Standing Ubsupported with Feet Together  Able to place feet together independently and stand 1 minute safely  From Standing, Reach Forward with Outstretched Arm  Can reach confidently >25 cm (10")    From Standing Position, Pick up Object from Vitoria Conyer to pick up shoe safely and easily    From Standing Position, Turn to Look Behind Over each Shoulder  Looks behind one side only/other side shows less weight shift    Turn 360 Degrees  Needs close supervision or verbal cueing    Standing Unsupported, Alternately Place Feet on Step/Stool  Able to complete 4 steps without aid or supervision    Standing Unsupported, One Foot in Front  Able to plae foot ahead of the other independently and hold 30 seconds    Standing on One Leg  Able to lift leg independently and hold equal to or more than 3 seconds    Total Score  47    Berg comment:  47/56                   [facility] Adult PT Treatment/Exercise - 10/07/17 0909      Lumbar Exercises:  Quadruped   Straight Leg Raise  10 reps LE extension with hamstring curl    Other Quadruped Lumbar Exercises  10 reps Fire hydrants (hip ER)       Knee/Hip Exercises: Sidelying   Hip ABduction  Strengthening    Other Sidelying Knee/Hip Exercises  side planks on elbows and knees flexed, 10 reps each side: hip drops, 2 sets 10 second hold, 2 sets hold with upper UE ER/retraction reaching up and back.  Transitioned to quadruped and transitioning from quadruped > modified side plank on straight arm and top leg extended x 5 reps.             PT Education - 10/07/17 1349    Education provided  Yes    Education Details  progress towards STG with AFO, continued core strengthening and proximal hip strengthening    Person(s) Educated  Patient    Methods  Explanation    Comprehension  Verbalized understanding       PT Short Term Goals - 10/07/17 1354      PT SHORT TERM GOAL #1   Title  Pt wil participate in AFO trial with [employer] and initiate training with PT for improved safety during home and community ambulation    Time  4    Period  Weeks    Status  Achieved      PT SHORT TERM GOAL #2   Title  Pt will ambulate 230' on indoor, level surfaces with cane and appropriate AFO with min A    Time  4    Period  Weeks    Status  Achieved      PT SHORT TERM GOAL #5   Title  Pt will improve standing balance as indicated by increase in BERG balance score to > or = 50/56    Baseline  44/56 > 47/56    Time  4    Period  Weeks    Status  Partially Met      PT SHORT TERM GOAL #6   Title  Pt will improve safety with gait as indicated by increase in gait velocity to >/= 2.63f/sec with LRAD and AFO    Baseline  2.54 ft/sec with AFO and cane    Time  4    Period  Weeks    Status  Achieved        PT Long Term Goals - 09/26/17 1531  PT LONG TERM GOAL #1   Title  Will be independent with HEP with standing HEP and Bioness HEP if approved    Time  8    Period  Weeks    Status  Revised     Target Date  10/20/17      PT LONG TERM GOAL #2   Title  Pt will ambulate x 115' indoors with LRAD and AFO with supervision; ambulate 150' outside on pavement and negotiate 4 stairs with one rail, cane, AFO and min A    Baseline  50' with simulated AFO, cane and mod A    Time  8    Period  Weeks    Status  Revised    Target Date  10/20/17      PT LONG TERM GOAL #3   Title  Pt will demonstrate independence with Bioness home unit (donning/doffing, wear schedule, skin care, care of components)    Baseline  Awaiting approval    Time  8    Period  Weeks    Status  New    Target Date  10/20/17      PT LONG TERM GOAL #5   Title  Pt will decrease falls risk as indicated by increase in BERG score to > or = 50/56    Baseline  47/56    Time  8    Period  Weeks    Status  Revised    Target Date  10/20/17      PT LONG TERM GOAL #7   Title  Pt will improve gait velocity to > or = 2.8 ft/sec with LRAD and appropriate AFO     Baseline  2.17 ft/sec with RW, no AFO    Time  8    Period  Weeks    Status  Revised    Target Date  10/20/17            Plan - 10/07/17 1350    Clinical Impression Statement  Finalized STG assessment with pt wearing new AFO.  Pt does demonstrate improved gait velocity with cane and AFO with decreased episodes of foot drag and genu recurvatum and demonstrates improved static balance during BERG balance test.  Continues to demonstrate impaired ability to maintain trunk and proximal hip control during single limb stance activities.  Engaged pt in trunk and lateral hip strengthening in sidelying and quadruped; pt tolerated well and will benefit from ongoing strengthening, gait and balance training to continue to make progress towards LTG.    Rehab Potential  Good    Clinical Impairments Affecting Rehab Potential  MS     PT Frequency  2x / week    PT Duration  8 weeks    PT Treatment/Interventions  ADLs/Self Care Home Management;Cryotherapy;Vasopneumatic  Device;Taping;Gait training;Stair training;Functional mobility training;Therapeutic activities;Therapeutic exercise;Balance training;Neuromuscular re-education;Passive range of motion;Manual techniques;Patient/family education;Electrical Stimulation;DME Instruction;Orthotic Fit/Training;Energy conservation    PT Next Visit Plan  Side planks, quadruped core/lateral hip strengthening.  gait with new AFO.  Continue Stepping strategy training using sports cord for perturbations-vary surfaces; during gait if you have +2.  Continue proximal hip/core strengthening with Bioness - tandem stand, side step with squat, tall kneeling and half kneel, closed chain hip ABD (tap downs laterally).  Gait with Bioness.      Consulted and Agree with Plan of Care  Patient;Family member/caregiver    Family Member Consulted  mother       Patient will benefit from skilled therapeutic intervention in order to improve  the following deficits and impairments:  Abnormal gait, Pain, Decreased range of motion, Decreased balance, Decreased mobility, Difficulty walking, Decreased activity tolerance, Decreased strength, Decreased endurance, Impaired flexibility, Impaired sensation  Visit Diagnosis: Difficulty in walking, not elsewhere classified  Muscle weakness (generalized)  Unsteadiness on feet  Foot drop, right  Foot drop, left     Problem List Patient Active Problem List   Diagnosis Date Noted  . Vitamin D insufficiency 11/29/2016  . Hypogonadism in male 11/01/2016  . Type 1 diabetes mellitus ([place])   . Hypothyroidism   . Hypertension   . Hyperlipidemia   . Closed fracture of right fibula and tibia 10/29/2016  . Closed tibia fracture 10/29/2016  . Closed fracture of right tibial plafond with fibula involvement 10/29/2016  . Abnormal liver function tests 03/06/2013  . RASH AND OTHER NONSPECIFIC SKIN ERUPTION 10/07/2009  . SHOULDER PAIN, LEFT 12/27/2008  . Type I (juvenile type) diabetes mellitus without  mention of complication, uncontrolled 12/03/2007  . HYPOTHYROIDISM 11/12/2007  . HYPERCHOLESTEROLEMIA 11/12/2007  . MULTIPLE SCLEROSIS 08/29/2007  . PROLIFERATIVE DIABETIC RETINOPATHY 08/29/2007  . HYPERTENSION 12/25/2006    [name], PT, DPT 10/07/17    1:56 PM    [facility] [address] [place], [place], [address] Phone: [phone]   Fax:  [phone]  Name: ZAKARI BATHE MRN: [id number] Date of Birth: [birth year]

## 2017-10-10 ENCOUNTER — Encounter: Payer: Self-pay | Admitting: Physical Therapy

## 2017-10-10 ENCOUNTER — Ambulatory Visit: Payer: Medicare Other | Admitting: Physical Therapy

## 2017-10-10 DIAGNOSIS — R2681 Unsteadiness on feet: Secondary | ICD-10-CM

## 2017-10-10 DIAGNOSIS — R262 Difficulty in walking, not elsewhere classified: Secondary | ICD-10-CM

## 2017-10-10 DIAGNOSIS — M21371 Foot drop, right foot: Secondary | ICD-10-CM

## 2017-10-10 DIAGNOSIS — M21372 Foot drop, left foot: Secondary | ICD-10-CM | POA: Diagnosis not present

## 2017-10-10 DIAGNOSIS — M6281 Muscle weakness (generalized): Secondary | ICD-10-CM | POA: Diagnosis not present

## 2017-10-10 NOTE — Therapy (Signed)
[facility] [address] [place], [place], [address] Phone: [phone]   Fax:  [phone]  Physical Therapy Treatment  Patient Details  Name: Johnathan Ross MRN: [id number] Date of Birth: 22-Sep-1970 Referring Provider: Altamese [place], MD   Encounter Date: 10/10/2017  PT End of Session - 10/10/17 1438    Visit Number  72    Number of Visits  27    Date for PT Re-Evaluation  10/20/17    Authorization Type  Medicare    PT Start Time  0850    PT Stop Time  0935    PT Time Calculation (min)  45 min    Equipment Utilized During Treatment  Other (comment) bioness, AFO    Activity Tolerance  Patient tolerated treatment well    Behavior During Therapy  [facility] for tasks assessed/performed       Past Medical History:  Diagnosis Date  . Hyperlipidemia   . Hypertension   . Hypogonadism in male 11/01/2016  . Hypothyroidism   . Multiple sclerosis ([place])   . 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 Type 1 diabetes mellitus ([place]) dx'd 1994  . Vitamin D insufficiency 11/29/2016    Past Surgical History:  Procedure Laterality Date  . EYE SURGERY Bilateral    "laser for diabetic retinopathy"  . FRACTURE SURGERY    . OPEN REDUCTION INTERNAL FIXATION (ORIF) TIBIA/FIBULA FRACTURE Right 10/30/2016  . ORIF ANKLE FRACTURE Left 2015  . ORIF TIBIA FRACTURE Right 10/30/2016   Procedure: OPEN REDUCTION INTERNAL FIXATION (ORIF) TIBIA FIBULA FRACTURE;  Surgeon: Altamese [place], MD;  Location: [facility];  Service: Orthopedics;  Laterality: Right;  . RETINAL LASER PROCEDURE Bilateral    "for diabetic retinopathy"    There were no vitals filed for this visit.  Subjective Assessment - 10/10/17 0856    Subjective  Back to walking with the cane today but having a rough day with mobility; noticed knees popping back a lot yesterday.  Felt more sleepy this morning; CBG was within normal limits.    Patient is accompained by:  Family member    Pertinent History  MS dx about 6 years ago    How long can you sit comfortably?  unlimited    How long can you stand comfortably?  unlimited    How long can you walk comfortably?  household distances due to LE fatigue/weakness    Patient Stated Goals  Walk without AD;  On 06/17/17 patient states "for me, coming off of that (RW) is how to measure improvement".     Currently in Pain?  No/denies                       [facility] Adult PT Treatment/Exercise - 10/10/17 1431      Ambulation/Gait   Ambulation/Gait  Yes    Ambulation/Gait Assistance  4: Min assist    Ambulation/Gait Assistance Details  with Bioness in gait mode focusing on keeping cane close to BOS to allow for more upright trunk posture (pt tends to laterally flex to L and lean forwards), increased core activation, full step length LLE, increased stance time/stability RLE and shorter R step length to allow COG to progress over RLE.      Ambulation Distance (Feet)  200 Feet    Assistive device  Straight cane    Ambulation Surface  Level;Indoor      Modalities   Modalities  Holiday representative  Electrical Stimulation Location  R anterior tib and hamstring    Electrical Stimulation Action  open and closed chain ankle DF, hip extension, knee flexion    Electrical Stimulation Parameters  see settings in Tablet 1    Electrical Stimulation Goals  Strength;Neuromuscular facilitation          Balance Exercises - 10/10/17 1434      Balance Exercises: Standing   Step Over Hurdles / Cones  Use of Bioness in gait mode, floor ladder on floor during forwards and R/L lateral stepping x 4 reps each focusing on keeping LLE outside ladder, RLE inside ladder.  Use of rounded ladder to facilitate increased RLE clearance, increased weight shift to R and increased step length LLE.  Min-mod A required        PT Education - 10/10/17 1437    Education provided  Yes    Education Details  gait with AFO  and cane    Person(s) Educated  Patient    Methods  Explanation;Demonstration    Comprehension  Need further instruction       PT Short Term Goals - 10/07/17 1354      PT SHORT TERM GOAL #1   Title  Pt wil participate in AFO trial with [employer] and initiate training with PT for improved safety during home and community ambulation    Time  4    Period  Weeks    Status  Achieved      PT SHORT TERM GOAL #2   Title  Pt will ambulate 230' on indoor, level surfaces with cane and appropriate AFO with min A    Time  4    Period  Weeks    Status  Achieved      PT SHORT TERM GOAL #5   Title  Pt will improve standing balance as indicated by increase in BERG balance score to > or = 50/56    Baseline  44/56 > 47/56    Time  4    Period  Weeks    Status  Partially Met      PT SHORT TERM GOAL #6   Title  Pt will improve safety with gait as indicated by increase in gait velocity to >/= 2.17f/sec with LRAD and AFO    Baseline  2.54 ft/sec with AFO and cane    Time  4    Period  Weeks    Status  Achieved        PT Long Term Goals - 09/26/17 1531      PT LONG TERM GOAL #1   Title  Will be independent with HEP with standing HEP and Bioness HEP if approved    Time  8    Period  Weeks    Status  Revised    Target Date  10/20/17      PT LONG TERM GOAL #2   Title  Pt will ambulate x 115' indoors with LRAD and AFO with supervision; ambulate 150' outside on pavement and negotiate 4 stairs with one rail, cane, AFO and min A    Baseline  50' with simulated AFO, cane and mod A    Time  8    Period  Weeks    Status  Revised    Target Date  10/20/17      PT LONG TERM GOAL #3   Title  Pt will demonstrate independence with Bioness home unit (donning/doffing, wear schedule, skin care, care of components)    Baseline  Awaiting approval    Time  8    Period  Weeks    Status  New    Target Date  10/20/17      PT LONG TERM GOAL #5   Title  Pt will decrease falls risk as indicated by increase  in BERG score to > or = 50/56    Baseline  47/56    Time  8    Period  Weeks    Status  Revised    Target Date  10/20/17      PT LONG TERM GOAL #7   Title  Pt will improve gait velocity to > or = 2.8 ft/sec with LRAD and appropriate AFO     Baseline  2.17 ft/sec with RW, no AFO    Time  8    Period  Weeks    Status  Revised    Target Date  10/20/17            Plan - 10/10/17 1439    Clinical Impression Statement  Continued gait training with both BIONESS and AFO focusing on more efficient gait sequence with use of floor ladder for visual feedback of step placement, step length and weight shifting.  Attempted to carryover more efficient sequence to gait overground.  Due to increased fatigue today pt required increased rest breaks and had two episodes of LOB with mod-max A to recover.  Will continue to address to progress towards LTG.    Rehab Potential  Good    Clinical Impairments Affecting Rehab Potential  MS     PT Frequency  2x / week    PT Duration  8 weeks    PT Treatment/Interventions  ADLs/Self Care Home Management;Cryotherapy;Vasopneumatic Device;Taping;Gait training;Stair training;Functional mobility training;Therapeutic activities;Therapeutic exercise;Balance training;Neuromuscular re-education;Passive range of motion;Manual techniques;Patient/family education;Electrical Stimulation;DME Instruction;Orthotic Fit/Training;Energy conservation    PT Next Visit Plan  Side planks, quadruped core/lateral hip strengthening.  gait with new AFO, floor ladder. Continue Stepping strategy training using sports cord for perturbations-vary surfaces; during gait if you have +2.  Continue proximal hip/core strengthening with Bioness - tandem stand, side step with squat, tall kneeling and half kneel, closed chain hip ABD (tap downs laterally).  Gait with Bioness.      Consulted and Agree with Plan of Care  Patient;Family member/caregiver    Family Member Consulted  mother       Patient  will benefit from skilled therapeutic intervention in order to improve the following deficits and impairments:  Abnormal gait, Pain, Decreased range of motion, Decreased balance, Decreased mobility, Difficulty walking, Decreased activity tolerance, Decreased strength, Decreased endurance, Impaired flexibility, Impaired sensation  Visit Diagnosis: Difficulty in walking, not elsewhere classified  Muscle weakness (generalized)  Unsteadiness on feet  Foot drop, right  Foot drop, left     Problem List Patient Active Problem List   Diagnosis Date Noted  . Vitamin D insufficiency 11/29/2016  . Hypogonadism in male 11/01/2016  . Type 1 diabetes mellitus ([place])   . Hypothyroidism   . Hypertension   . Hyperlipidemia   . Closed fracture of right fibula and tibia 10/29/2016  . Closed tibia fracture 10/29/2016  . Closed fracture of right tibial plafond with fibula involvement 10/29/2016  . Abnormal liver function tests 03/06/2013  . RASH AND OTHER NONSPECIFIC SKIN ERUPTION 10/07/2009  . SHOULDER PAIN, LEFT 12/27/2008  . Type I (juvenile type) diabetes mellitus without mention of complication, uncontrolled 12/03/2007  . HYPOTHYROIDISM 11/12/2007  . HYPERCHOLESTEROLEMIA 11/12/2007  . MULTIPLE SCLEROSIS 08/29/2007  .  PROLIFERATIVE DIABETIC RETINOPATHY 08/29/2007  . HYPERTENSION 12/25/2006    [name], PT, DPT 10/10/17    2:43 PM    [facility] [address] [place], [place], [address] Phone: [phone]   Fax:  [phone]  Name: Johnathan Ross MRN: [id number] Date of Birth: [birth year]

## 2017-10-14 ENCOUNTER — Ambulatory Visit: Payer: Medicare Other | Admitting: Physical Therapy

## 2017-10-14 ENCOUNTER — Encounter: Payer: Self-pay | Admitting: Physical Therapy

## 2017-10-14 DIAGNOSIS — M21372 Foot drop, left foot: Secondary | ICD-10-CM | POA: Diagnosis not present

## 2017-10-14 DIAGNOSIS — R262 Difficulty in walking, not elsewhere classified: Secondary | ICD-10-CM

## 2017-10-14 DIAGNOSIS — R2681 Unsteadiness on feet: Secondary | ICD-10-CM | POA: Diagnosis not present

## 2017-10-14 DIAGNOSIS — M21371 Foot drop, right foot: Secondary | ICD-10-CM | POA: Diagnosis not present

## 2017-10-14 DIAGNOSIS — M6281 Muscle weakness (generalized): Secondary | ICD-10-CM

## 2017-10-14 NOTE — Therapy (Signed)
[facility] [address] [place], [place], [address] Phone: [phone]   Fax:  [phone]  Physical Therapy Treatment  Patient Details  Name: Johnathan Ross MRN: [id number] Date of Birth: 07-Jul-1970 Referring Provider: Altamese [place], MD   Encounter Date: 10/14/2017  PT End of Session - 10/14/17 1435    Visit Number  47    Number of Visits  30    Date for PT Re-Evaluation  10/20/17    Authorization Type  Medicare    PT Start Time  0852    PT Stop Time  0935    PT Time Calculation (min)  43 min    Equipment Utilized During Treatment  Other (comment) bioness, AFO    Activity Tolerance  Patient tolerated treatment well    Behavior During Therapy  [facility] for tasks assessed/performed       Past Medical History:  Diagnosis Date  . Hyperlipidemia   . Hypertension   . Hypogonadism in male 11/01/2016  . Hypothyroidism   . Multiple sclerosis ([place])   . 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 Type 1 diabetes mellitus ([place]) dx'd 1994  . Vitamin D insufficiency 11/29/2016    Past Surgical History:  Procedure Laterality Date  . EYE SURGERY Bilateral    "laser for diabetic retinopathy"  . FRACTURE SURGERY    . OPEN REDUCTION INTERNAL FIXATION (ORIF) TIBIA/FIBULA FRACTURE Right 10/30/2016  . ORIF ANKLE FRACTURE Left 2015  . ORIF TIBIA FRACTURE Right 10/30/2016   Procedure: OPEN REDUCTION INTERNAL FIXATION (ORIF) TIBIA FIBULA FRACTURE;  Surgeon: Altamese Roseto, MD;  Location: [facility];  Service: Orthopedics;  Laterality: Right;  . RETINAL LASER PROCEDURE Bilateral    "for diabetic retinopathy"    There were no vitals filed for this visit.  Subjective Assessment - 10/14/17 0855    Subjective  Did not go outside much this weekend; has not noticed that the heat affects weakness or fatigue.  Doing well with AFO and cane.    Patient is accompained by:  Family member    Pertinent History  MS dx about 6 years ago    How long  can you sit comfortably?  unlimited    How long can you stand comfortably?  unlimited    How long can you walk comfortably?  household distances due to LE fatigue/weakness    Patient Stated Goals  Walk without AD;  On 06/17/17 patient states "for me, coming off of that (RW) is how to measure improvement".     Currently in Pain?  No/denies                       [facility] Adult PT Treatment/Exercise - 10/14/17 1430      Ambulation/Gait   Ambulation/Gait  Yes    Ambulation/Gait Assistance  5: Supervision    Ambulation/Gait Assistance Details  with Bioness, AFO and straight cane over level surfaces with pt demonstrating improved positioning with cane and sequencing with RLE step length, foot clearance and knee control    Ambulation Distance (Feet)  115 Feet    Assistive device  Straight cane    Ambulation Surface  Level;Indoor    Pre-Gait Activities  Performed forwards, retro and R/L lateral gait in // bars for increased UE support and use of floor ladders as visual cue for increased RLE hip/knee flexion for increased foot clearance on R, increased weight shift and stance time on R and increased step length LLE.  Retro gait for  increased hamstring activation.  Also performed 10 reps single leg forwards and backwards advancement in ladder squares with therapist providing facilitation at pelvis and trunk for increased stability improving activation and control distally in LE.    Gait Comments  Performed gait and pre-gait in // bars with use of Bioness in gait mode      Knee/Hip Exercises: Standing   Hip Abduction  Stengthening;Right;Left;3 sets;5 reps    Abduction Limitations  with bilat UE support on // bars pushing foot against BOSU (sliding BOSU across floor) for resistance      Modalities   Modalities  Electrical Stimulation      Electrical Stimulation   Electrical Stimulation Location  R anterior tib and hamstring    Electrical Stimulation Action  open and closed chain ankle DF,  hip extension, knee flexion    Electrical Stimulation Parameters  see settings in Tablet 1, steering electrodes    Electrical Stimulation Goals  Strength;Neuromuscular facilitation               PT Short Term Goals - 10/07/17 1354      PT SHORT TERM GOAL #1   Title  Pt wil participate in AFO trial with [employer] and initiate training with PT for improved safety during home and community ambulation    Time  4    Period  Weeks    Status  Achieved      PT SHORT TERM GOAL #2   Title  Pt will ambulate 230' on indoor, level surfaces with cane and appropriate AFO with min A    Time  4    Period  Weeks    Status  Achieved      PT SHORT TERM GOAL #5   Title  Pt will improve standing balance as indicated by increase in BERG balance score to > or = 50/56    Baseline  44/56 > 47/56    Time  4    Period  Weeks    Status  Partially Met      PT SHORT TERM GOAL #6   Title  Pt will improve safety with gait as indicated by increase in gait velocity to >/= 2.2f/sec with LRAD and AFO    Baseline  2.54 ft/sec with AFO and cane    Time  4    Period  Weeks    Status  Achieved        PT Long Term Goals - 09/26/17 1531      PT LONG TERM GOAL #1   Title  Will be independent with HEP with standing HEP and Bioness HEP if approved    Time  8    Period  Weeks    Status  Revised    Target Date  10/20/17      PT LONG TERM GOAL #2   Title  Pt will ambulate x 115' indoors with LRAD and AFO with supervision; ambulate 150' outside on pavement and negotiate 4 stairs with one rail, cane, AFO and min A    Baseline  50' with simulated AFO, cane and mod A    Time  8    Period  Weeks    Status  Revised    Target Date  10/20/17      PT LONG TERM GOAL #3   Title  Pt will demonstrate independence with Bioness home unit (donning/doffing, wear schedule, skin care, care of components)    Baseline  Awaiting approval    Time  8  Period  Weeks    Status  New    Target Date  10/20/17      PT LONG  TERM GOAL #5   Title  Pt will decrease falls risk as indicated by increase in BERG score to > or = 50/56    Baseline  47/56    Time  8    Period  Weeks    Status  Revised    Target Date  10/20/17      PT LONG TERM GOAL #7   Title  Pt will improve gait velocity to > or = 2.8 ft/sec with LRAD and appropriate AFO     Baseline  2.17 ft/sec with RW, no AFO    Time  8    Period  Weeks    Status  Revised    Target Date  10/20/17            Plan - 10/14/17 1435    Clinical Impression Statement  Continued Bioness training and use of floor ladder for gait sequencing, SLS training and proximal hip strengthening.  Pt is making good progress with AFO, Bioness and cane and demonstrates improved safety, efficiency and sequencing with indoor gait today.  Pt is making steady progress towards LTG.    Rehab Potential  Good    Clinical Impairments Affecting Rehab Potential  MS     PT Frequency  2x / week    PT Duration  8 weeks    PT Treatment/Interventions  ADLs/Self Care Home Management;Cryotherapy;Vasopneumatic Device;Taping;Gait training;Stair training;Functional mobility training;Therapeutic activities;Therapeutic exercise;Balance training;Neuromuscular re-education;Passive range of motion;Manual techniques;Patient/family education;Electrical Stimulation;DME Instruction;Orthotic Fit/Training;Energy conservation    PT Next Visit Plan  Schedule more visits, 4 more weeks!  CHECK LTG on 5/28 - send to me for recertification.  ABD pushing BOSU.  Side planks, quadruped core/lateral hip strengthening.  gait with new AFO, floor ladder. Continue Stepping strategy training using sports cord for perturbations-vary surfaces; during gait if you have +2.  Continue proximal hip/core strengthening with Bioness - tandem stand, side step with squat, tall kneeling and half kneel, closed chain hip ABD (tap downs laterally).  Gait with Bioness.      Consulted and Agree with Plan of Care  Patient;Family member/caregiver     Family Member Consulted  mother       Patient will benefit from skilled therapeutic intervention in order to improve the following deficits and impairments:  Abnormal gait, Pain, Decreased range of motion, Decreased balance, Decreased mobility, Difficulty walking, Decreased activity tolerance, Decreased strength, Decreased endurance, Impaired flexibility, Impaired sensation  Visit Diagnosis: Difficulty in walking, not elsewhere classified  Muscle weakness (generalized)  Unsteadiness on feet  Foot drop, right  Foot drop, left     Problem List Patient Active Problem List   Diagnosis Date Noted  . Vitamin D insufficiency 11/29/2016  . Hypogonadism in male 11/01/2016  . Type 1 diabetes mellitus ([place])   . Hypothyroidism   . Hypertension   . Hyperlipidemia   . Closed fracture of right fibula and tibia 10/29/2016  . Closed tibia fracture 10/29/2016  . Closed fracture of right tibial plafond with fibula involvement 10/29/2016  . Abnormal liver function tests 03/06/2013  . RASH AND OTHER NONSPECIFIC SKIN ERUPTION 10/07/2009  . SHOULDER PAIN, LEFT 12/27/2008  . Type I (juvenile type) diabetes mellitus without mention of complication, uncontrolled 12/03/2007  . HYPOTHYROIDISM 11/12/2007  . HYPERCHOLESTEROLEMIA 11/12/2007  . MULTIPLE SCLEROSIS 08/29/2007  . PROLIFERATIVE DIABETIC RETINOPATHY 08/29/2007  . HYPERTENSION 12/25/2006  [name], PT, DPT 10/14/17    2:42 PM     [facility] [address] [place], [place], [address] Phone: [phone]   Fax:  [phone]  Name: Johnathan Ross MRN: [id number] Date of Birth: [birth year]

## 2017-10-17 ENCOUNTER — Ambulatory Visit: Payer: Medicare Other | Admitting: Physical Therapy

## 2017-10-17 ENCOUNTER — Encounter: Payer: Self-pay | Admitting: Physical Therapy

## 2017-10-17 DIAGNOSIS — R2681 Unsteadiness on feet: Secondary | ICD-10-CM | POA: Diagnosis not present

## 2017-10-17 DIAGNOSIS — M6281 Muscle weakness (generalized): Secondary | ICD-10-CM | POA: Diagnosis not present

## 2017-10-17 DIAGNOSIS — R262 Difficulty in walking, not elsewhere classified: Secondary | ICD-10-CM | POA: Diagnosis not present

## 2017-10-17 DIAGNOSIS — M21372 Foot drop, left foot: Secondary | ICD-10-CM | POA: Diagnosis not present

## 2017-10-17 DIAGNOSIS — M21371 Foot drop, right foot: Secondary | ICD-10-CM

## 2017-10-17 NOTE — Therapy (Signed)
[facility] [address] [place], [place], [address] Phone: [phone]   Fax:  [phone]  Physical Therapy Treatment  Patient Details  Name: Johnathan Ross MRN: [id number] Date of Birth: 07-06-1970 Referring Provider: Altamese [place], MD   Encounter Date: 10/17/2017  PT End of Session - 10/17/17 1231    Visit Number  59    Number of Visits  85    Date for PT Re-Evaluation  10/20/17    Authorization Type  Medicare    PT Start Time  0848    PT Stop Time  0935    PT Time Calculation (min)  47 min    Activity Tolerance  Patient tolerated treatment well    Behavior During Therapy  [facility] for tasks assessed/performed       Past Medical History:  Diagnosis Date  . Hyperlipidemia   . Hypertension   . Hypogonadism in male 11/01/2016  . Hypothyroidism   . Multiple sclerosis ([place])   . 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 Type 1 diabetes mellitus ([place]) dx'd 1994  . Vitamin D insufficiency 11/29/2016    Past Surgical History:  Procedure Laterality Date  . EYE SURGERY Bilateral    "laser for diabetic retinopathy"  . FRACTURE SURGERY    . OPEN REDUCTION INTERNAL FIXATION (ORIF) TIBIA/FIBULA FRACTURE Right 10/30/2016  . ORIF ANKLE FRACTURE Left 2015  . ORIF TIBIA FRACTURE Right 10/30/2016   Procedure: OPEN REDUCTION INTERNAL FIXATION (ORIF) TIBIA FIBULA FRACTURE;  Surgeon: Altamese [place], MD;  Location: [facility];  Service: Orthopedics;  Laterality: Right;  . RETINAL LASER PROCEDURE Bilateral    "for diabetic retinopathy"    There were no vitals filed for this visit.  Subjective Assessment - 10/17/17 0851    Subjective  Still doing well with cane and AFO, took insole out to reduce squeaking in shoe.  Son with pt today.    Patient is accompained by:  Family member    Pertinent History  MS dx about 6 years ago    How long can you sit comfortably?  unlimited    How long can you stand comfortably?  unlimited    How  long can you walk comfortably?  household distances due to LE fatigue/weakness    Patient Stated Goals  Walk without AD;  On 06/17/17 patient states "for me, coming off of that (RW) is how to measure improvement".          [facility] PT Assessment - 10/17/17 0901      Assessment   Medical Diagnosis  R tibia/fibula fx, s/p ORIF    Referring Provider  Altamese [place], MD    Onset Date/Surgical Date  10/30/16    Hand Dominance  Right    Prior Therapy  yes, for past LE injury      Precautions   Precautions  Fall      Prior Function   Level of Independence  Independent      Observation/Other Assessments   Focus on Therapeutic Outcomes (FOTO)   Deferred-not indicated for MS diagnoses      Ambulation/Gait   Ambulation/Gait  Yes    Ambulation/Gait Assistance  5: Supervision    Ambulation/Gait Assistance Details  improved placement of cane and decreased R genu recurvatum and toe drag noted.  Continues to require intermittent standing rest breaks due to fatigue and to prevent LOB    Ambulation Distance (Feet)  115 Feet    Assistive device  Straight cane    Gait  Pattern  Step-through pattern;Narrow base of support;Lateral trunk lean to left    Ambulation Surface  Level;Indoor    Stairs  Yes    Stairs Assistance  6: Modified independent (Device/Increase time)    Stairs Assistance Details (indicate cue type and reason)  reciprocal pattern to ascend and descend with one rail and AFO    Stair Management Technique  One rail Right;Alternating pattern;Forwards    Number of Stairs  8    Height of Stairs  6      Standardized Balance Assessment   10 Meter Walk  13.22 or 2.48 ft/sec with cane and AFO      Berg Balance Test   Sit to Stand  Able to stand without using hands and stabilize independently    Standing Unsupported  Able to stand safely 2 minutes    Sitting with Back Unsupported but Feet Supported on Floor or Stool  Able to sit safely and securely 2 minutes    Stand to Sit  Sits safely with  minimal use of hands    Transfers  Able to transfer safely, minor use of hands    Standing Unsupported with Eyes Closed  Able to stand 10 seconds safely    Standing Ubsupported with Feet Together  Able to place feet together independently and stand 1 minute safely    From Standing, Reach Forward with Outstretched Arm  Can reach confidently >25 cm (10")    From Standing Position, Pick up Object from Floor  Able to pick up shoe safely and easily    From Standing Position, Turn to Look Behind Over each Shoulder  Looks behind one side only/other side shows less weight shift    Turn 360 Degrees  Able to turn 360 degrees safely but slowly    Standing Unsupported, Alternately Place Feet on Step/Stool  Able to complete 4 steps without aid or supervision    Standing Unsupported, One Foot in Front  Able to plae foot ahead of the other independently and hold 30 seconds    Standing on One Leg  Able to lift leg independently and hold 5-10 seconds    Total Score  49    Berg comment:  49/56         [facility] Adult PT Treatment/Exercise - 10/17/17 0901      Knee/Hip Exercises: Seated   Sit to Sand  5 reps;with UE support single leg from tall stool      For sit > stand: therapist provided tactile cues to maintain RLE position in ABD and for full anterior weight shift and knee control in stance on RLE.  Discussed safe ways to perform at home.       PT Education - 10/17/17 1230    Education provided  Yes    Education Details  plan for therapy visits; single leg sit > stand from tall bar stool    Person(s) Educated  Patient    Methods  Explanation;Demonstration    Comprehension  Need further instruction       PT Short Term Goals - 10/07/17 1354      PT SHORT TERM GOAL #1   Title  Pt wil participate in AFO trial with [employer] and initiate training with PT for improved safety during home and community ambulation    Time  4    Period  Weeks    Status  Achieved      PT SHORT TERM GOAL #2   Title   Pt will ambulate 230' on  indoor, level surfaces with cane and appropriate AFO with min A    Time  4    Period  Weeks    Status  Achieved      PT SHORT TERM GOAL #5   Title  Pt will improve standing balance as indicated by increase in BERG balance score to > or = 50/56    Baseline  44/56 > 47/56    Time  4    Period  Weeks    Status  Partially Met      PT SHORT TERM GOAL #6   Title  Pt will improve safety with gait as indicated by increase in gait velocity to >/= 2.28f/sec with LRAD and AFO    Baseline  2.54 ft/sec with AFO and cane    Time  4    Period  Weeks    Status  Achieved        PT Long Term Goals - 10/17/17 1234      PT LONG TERM GOAL #1   Title  Will be independent with HEP with standing HEP and Bioness HEP if approved    Time  8    Period  Weeks    Status  On-going      PT LONG TERM GOAL #2   Title  Pt will ambulate x 115' indoors with LRAD and AFO with supervision; ambulate 150' outside on pavement and negotiate 4 stairs with one rail, cane, AFO and min A    Baseline  115' with cane and AFO supervision-min A; 4 stairs, one rail with AFO and supervision.  Outside not assessed today    Time  8    Period  Weeks    Status  Partially Met      PT LONG TERM GOAL #3   Title  Pt will demonstrate independence with Bioness home unit (donning/doffing, wear schedule, skin care, care of components)    Baseline  Awaiting approval    Time  8    Period  Weeks    Status  On-going      PT LONG TERM GOAL #5   Title  Pt will decrease falls risk as indicated by increase in BERG score to > or = 50/56    Baseline  49/56, progressing    Time  8    Period  Weeks    Status  Partially Met      PT LONG TERM GOAL #7   Title  Pt will improve gait velocity to > or = 2.8 ft/sec with LRAD and appropriate AFO     Baseline  2.48 ft/sec with cane and AFO, progressing    Time  8    Period  Weeks    Status  Partially Met            Plan - 10/17/17 1232    Clinical Impression  Statement  Performed assessment of LTG with use of AFO and cane but not Bioness.  Pt continues to make slow but steady progress and has partially met gait and balance goals, Bioness goals are ongoing due to [place].  Pt continues have significant difficulty maintaining balance during single limb stance with decreased UE support due to continued ankle, hip and core weakness.  PT would benefit from continued skilled pt services to continue address impairments, reduce falls risk and maximize functional mobility independence and perform Bioness home unit training.    Rehab Potential  Good    Clinical Impairments Affecting Rehab Potential  MS     PT Frequency  2x / week    PT Duration  4 weeks    PT Treatment/Interventions  ADLs/Self Care Home Management;Cryotherapy;Vasopneumatic Device;Taping;Gait training;Stair training;Functional mobility training;Therapeutic activities;Therapeutic exercise;Balance training;Neuromuscular re-education;Passive range of motion;Manual techniques;Patient/family education;Electrical Stimulation;DME Instruction;Orthotic Fit/Training;Energy conservation    PT Next Visit Plan  Sit <> stand single leg from elevated seat/stool/mat using BIoness on R hamstring. ABD pushing BOSU.  Side planks, quadruped core/lateral hip strengthening.  gait with new AFO, floor ladder. Continue Stepping strategy training using sports cord for perturbations-vary surfaces; during gait if you have +2.  Continue proximal hip/core strengthening with Bioness - tandem stand, side step with squat, tall kneeling and half kneel, closed chain hip ABD (tap downs laterally).  Gait with Bioness.      Consulted and Agree with Plan of Care  Patient;Family member/caregiver    Family Member Consulted  mother       Patient will benefit from skilled therapeutic intervention in order to improve the following deficits and impairments:  Abnormal gait, Pain,  Decreased range of motion, Decreased balance, Decreased mobility, Difficulty walking, Decreased activity tolerance, Decreased strength, Decreased endurance, Impaired flexibility, Impaired sensation  Visit Diagnosis: Difficulty in walking, not elsewhere classified  Muscle weakness (generalized)  Unsteadiness on feet  Foot drop, right  Foot drop, left     Problem List Patient Active Problem List   Diagnosis Date Noted  . Vitamin D insufficiency 11/29/2016  . Hypogonadism in male 11/01/2016  . Type 1 diabetes mellitus ([place])   . Hypothyroidism   . Hypertension   . Hyperlipidemia   . Closed fracture of right fibula and tibia 10/29/2016  . Closed tibia fracture 10/29/2016  . Closed fracture of right tibial plafond with fibula involvement 10/29/2016  . Abnormal liver function tests 03/06/2013  . RASH AND OTHER NONSPECIFIC SKIN ERUPTION 10/07/2009  . SHOULDER PAIN, LEFT 12/27/2008  . Type I (juvenile type) diabetes mellitus without mention of complication, uncontrolled 12/03/2007  . HYPOTHYROIDISM 11/12/2007  . HYPERCHOLESTEROLEMIA 11/12/2007  . MULTIPLE SCLEROSIS 08/29/2007  . PROLIFERATIVE DIABETIC RETINOPATHY 08/29/2007  . HYPERTENSION 12/25/2006    [name], PT, DPT 10/17/17    3:01 PM    [facility] [address] [place], [place], [address] Phone: [phone]   Fax:  [phone]  Name: YASSEEN SALLS MRN: [id number] Date of Birth: 06/26/1970

## 2017-10-22 ENCOUNTER — Ambulatory Visit: Payer: Medicare Other | Admitting: Physical Therapy

## 2017-10-23 ENCOUNTER — Other Ambulatory Visit (INDEPENDENT_AMBULATORY_CARE_PROVIDER_SITE_OTHER): Payer: Medicare Other

## 2017-10-23 DIAGNOSIS — E039 Hypothyroidism, unspecified: Secondary | ICD-10-CM

## 2017-10-23 DIAGNOSIS — E1065 Type 1 diabetes mellitus with hyperglycemia: Secondary | ICD-10-CM

## 2017-10-23 LAB — COMPREHENSIVE METABOLIC PANEL
ALT: 15 U/L (ref 0–53)
AST: 17 U/L (ref 0–37)
Albumin: 4.3 g/dL (ref 3.5–5.2)
Alkaline Phosphatase: 92 U/L (ref 39–117)
BUN: 17 mg/dL (ref 6–23)
CO2: 28 mEq/L (ref 19–32)
Calcium: 9.3 mg/dL (ref 8.4–10.5)
Chloride: 100 mEq/L (ref 96–112)
Creatinine, Ser: 1.13 mg/dL (ref 0.40–1.50)
GFR: 90.16 mL/min (ref >60.00)
Glucose, Bld: 211 mg/dL — ABNORMAL HIGH (ref 70–99)
Potassium: 4.1 mEq/L (ref 3.5–5.1)
Sodium: 135 mEq/L (ref 135–145)
Total Bilirubin: 0.5 mg/dL (ref 0.2–1.2)
Total Protein: 7.3 g/dL (ref 6.0–8.3)

## 2017-10-23 LAB — TSH: TSH: 0.96 u[IU]/mL (ref 0.35–4.50)

## 2017-10-23 LAB — T4, FREE: Free T4: 1.41 ng/dL (ref 0.60–1.60)

## 2017-10-23 LAB — HEMOGLOBIN A1C: Hgb A1c MFr Bld: 8.4 % — ABNORMAL HIGH (ref 4.6–6.5)

## 2017-10-24 ENCOUNTER — Encounter: Payer: Self-pay | Admitting: Physical Therapy

## 2017-10-24 ENCOUNTER — Ambulatory Visit: Payer: Medicare Other | Admitting: Physical Therapy

## 2017-10-24 DIAGNOSIS — M21371 Foot drop, right foot: Secondary | ICD-10-CM | POA: Diagnosis not present

## 2017-10-24 DIAGNOSIS — R2681 Unsteadiness on feet: Secondary | ICD-10-CM

## 2017-10-24 DIAGNOSIS — M21372 Foot drop, left foot: Secondary | ICD-10-CM

## 2017-10-24 DIAGNOSIS — M6281 Muscle weakness (generalized): Secondary | ICD-10-CM | POA: Diagnosis not present

## 2017-10-24 DIAGNOSIS — R262 Difficulty in walking, not elsewhere classified: Secondary | ICD-10-CM | POA: Diagnosis not present

## 2017-10-24 NOTE — Patient Instructions (Addendum)
Access Code: [email or web address]  URL: [email or web address]  Date: 10/25/2017  Prepared by: Misty Stanley   Exercises  Quadruped Hip Extension Kicks - 10 reps - 1 sets - 1x daily - 5x weekly  Quadruped Fire Hydrant - 10 reps - 1 sets - 1x daily - 5x weekly  Bird Dog - 10 reps - 1 sets - 1x daily - 5x weekly  Side Plank on Knees - 6 reps - 1 sets - 1x daily - 5x weekly  Tall Kneeling Chop - 10 reps - 1 sets - 1x daily - 5x weekly  Half-Kneeling to Standing - 3 reps - 1 sets - 10 hold - 1x daily - 5x weekly

## 2017-10-25 ENCOUNTER — Other Ambulatory Visit: Payer: Medicare Other

## 2017-10-25 NOTE — Therapy (Signed)
[facility] [address] [place], [place], [address] Phone: [phone]   Fax:  [phone]  Physical Therapy Treatment  Patient Details  Name: Johnathan Ross MRN: [id number] Date of Birth: September 06, 1970 Referring Provider: Altamese [place], MD   Encounter Date: 10/24/2017  PT End of Session - 10/24/17 1101    Visit Number  59    Number of Visits  36    Date for PT Re-Evaluation  12/19/17    Authorization Type  Medicare    PT Start Time  0849    PT Stop Time  0936    PT Time Calculation (min)  47 min    Activity Tolerance  Patient tolerated treatment well    Behavior During Therapy  Flat affect       Past Medical History:  Diagnosis Date  . Hyperlipidemia   . Hypertension   . Hypogonadism in male 11/01/2016  . Hypothyroidism   . Multiple sclerosis ([place])   . 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 Type 1 diabetes mellitus ([place]) dx'd 1994  . Vitamin D insufficiency 11/29/2016    Past Surgical History:  Procedure Laterality Date  . EYE SURGERY Bilateral    "laser for diabetic retinopathy"  . FRACTURE SURGERY    . OPEN REDUCTION INTERNAL FIXATION (ORIF) TIBIA/FIBULA FRACTURE Right 10/30/2016  . ORIF ANKLE FRACTURE Left 2015  . ORIF TIBIA FRACTURE Right 10/30/2016   Procedure: OPEN REDUCTION INTERNAL FIXATION (ORIF) TIBIA FIBULA FRACTURE;  Surgeon: Altamese Belfast, MD;  Location: [facility];  Service: Orthopedics;  Laterality: Right;  . RETINAL LASER PROCEDURE Bilateral    "for diabetic retinopathy"    There were no vitals filed for this visit.  Subjective Assessment - 10/24/17 0902    Subjective  Forgot to put the AFO on today; took multiple breaks walking back to treatment area.  Discussed with pt and mother phone call with pt's sister and their questions about the plan of care.    Patient is accompained by:  Family member    Pertinent History  MS dx about 6 years ago    How long can you sit comfortably?   unlimited    How long can you stand comfortably?  unlimited    How long can you walk comfortably?  household distances due to LE fatigue/weakness    Patient Stated Goals  Walk without AD;  On 06/17/17 patient states "for me, coming off of that (RW) is how to measure improvement".     Currently in Pain?  No/denies                       [facility] Adult PT Treatment/Exercise - 10/24/17 1043      Ambulation/Gait   Ambulation/Gait  Yes    Ambulation/Gait Assistance  4: Min assist    Ambulation/Gait Assistance Details  pt ambulating without AFO today, noted to have increased genu recurvatum and required increased standing rest breaks when ambulating from waiting area <> treatment area    Ambulation Distance (Feet)  75 Feet    Assistive device  Straight cane    Gait Pattern  Step-through pattern;Narrow base of support;Decreased hip/knee flexion - right;Decreased dorsiflexion - right;Right genu recurvatum    Ambulation Surface  Level;Indoor      Therapeutic Activites    Therapeutic Activities  Other Therapeutic Activities    Other Therapeutic Activities  had a long conversation with pt and mother to assess understanding of conversation from previous session.  Pt  stating he understood and agreed with plan, mother states pt had communicated very minimal details to her about the conversation but after speaking with patient's sister (PT had contacted and discussed plan with sister on previous day) she had a better understanding of the conversation and plan.  Reinforced wiht pt and mother the progress he has made and that due to MS being a chronic diagnosis and needing therapy across the life span, it is necessary to take therapeutic breaks to allow patient to address and work on exercises in home or community environment and then return to therapy as new impairments or functional limitations arise.  Pt and mother agreeable to plan but pt does report being upset with his sister for calling;  discussed with pt his preferences for therapist communication with family.  Pt states he is "fine" with therapy speaking with family but therapy will contact pt to obtain permission prior to discussing details with family outside of the therapy treatment.  Discussed other options for community wellness; pt does not wish to go to a gym but may be willing to use the treadmill at his mother's house.        Performed the following exercises on mat on the floor to initiate hip and core strengthening HEP:  Access Code: [email or web address]  URL: [email or web address]  Date: 10/25/2017  Prepared by: Misty Stanley   Exercises  Quadruped Hip Extension Kicks - 10 reps - 1 sets - 1x daily - 5x weekly  Quadruped Fire Hydrant - 10 reps - 1 sets - 1x daily - 5x weekly  Bird Dog - 10 reps - 1 sets - 1x daily - 5x weekly  Side Plank on Knees - 6 reps - 1 sets - 1x daily - 5x weekly  Tall Kneeling Chop - 10 reps - 1 sets - 1x daily - 5x weekly  Half-Kneeling to Standing - 3 reps - 1 sets - 10 hold - 1x daily - 5x weekly        PT Education - 10/25/17 1100    Education provided  Yes    Education Details  see TA; core and hip strengthening HEP     Person(s) Educated  Patient;Parent(s)    Methods  Explanation;Demonstration    Comprehension  Need further instruction       PT Short Term Goals - 10/17/17 1502      PT SHORT TERM GOAL #1   Title  = LTG (cert written for 60 days but anticipate D/C at 4 weeks)        PT Long Term Goals - 10/17/17 1503      PT LONG TERM GOAL #1   Title  Will be independent with HEP with standing HEP and Bioness HEP if approved    Time  4    Period  Weeks    Status  Revised    Target Date  11/19/17      PT LONG TERM GOAL #2   Title  Pt will ambulate 150' outside on pavement and negotiate curb with supervision with AFO and cane    Baseline  115' with cane and AFO supervision-min A; 4 stairs, one rail with AFO and supervision.  Outside not assessed today     Time  4    Period  Weeks    Status  Revised    Target Date  11/19/17      PT LONG TERM GOAL #3   Title  Pt will demonstrate independence with Bioness home unit (donning/doffing, wear  schedule, skin care, care of components)    Baseline  Awaiting approval    Time  4    Period  Weeks    Status  Revised    Target Date  11/19/17      PT LONG TERM GOAL #5   Title  Pt will decrease falls risk as indicated by increase in BERG score to > or = 50/56    Baseline  49/56, progressing    Time  4    Period  Weeks    Status  Revised    Target Date  11/19/17      PT LONG TERM GOAL #7   Title  Pt will improve gait velocity to > or = 2.8 ft/sec with LRAD and appropriate AFO     Baseline  2.48 ft/sec with cane and AFO, progressing    Time  4    Period  Weeks    Status  Revised    Target Date  11/19/17            Plan - 10/24/17 1101    Clinical Impression Statement  Treatment session focused on ongoing discussion with pt and mother regarding plan for therapy and rationale for taking intermittent breaks from therapy.  Pt and mother verbalized understanding.  Initiated higher level core and proximal hip strengthening for final HEP; pt demonstrates improved stability in quadruped and tall/half kneeling.  Will continue to add to HEP next session.    Rehab Potential  Good    Clinical Impairments Affecting Rehab Potential  MS     PT Frequency  2x / week    PT Duration  4 weeks    PT Treatment/Interventions  ADLs/Self Care Home Management;Cryotherapy;Vasopneumatic Device;Taping;Gait training;Stair training;Functional mobility training;Therapeutic activities;Therapeutic exercise;Balance training;Neuromuscular re-education;Passive range of motion;Manual techniques;Patient/family education;Electrical Stimulation;DME Instruction;Orthotic Fit/Training;Energy conservation    PT Next Visit Plan  Keep adding to [place]; Sit <> stand single leg from elevated seat/stool/mat using BIoness on R hamstring.  ABD pushing BOSU.  Side planks, quadruped core/lateral hip strengthening.  gait with new AFO, floor ladder. Continue Stepping strategy training using sports cord for perturbations-vary surfaces; during gait if you have +2.  Continue proximal hip/core strengthening with Bioness - tandem stand, side step with squat, tall kneeling and half kneel, closed chain hip ABD (tap downs laterally).  Gait with Bioness.      Consulted and Agree with Plan of Care  Patient;Family member/caregiver    Family Member Consulted  mother       Patient will benefit from skilled therapeutic intervention in order to improve the following deficits and impairments:  Abnormal gait, Pain, Decreased range of motion, Decreased balance, Decreased mobility, Difficulty walking, Decreased activity tolerance, Decreased strength, Decreased endurance, Impaired flexibility, Impaired sensation  Visit Diagnosis: Difficulty in walking, not elsewhere classified  Muscle weakness (generalized)  Unsteadiness on feet  Foot drop, right  Foot drop, left     Problem List Patient Active Problem List   Diagnosis Date Noted  . Vitamin D insufficiency 11/29/2016  . Hypogonadism in male 11/01/2016  . Type 1 diabetes mellitus ([place])   . Hypothyroidism   . Hypertension   . Hyperlipidemia   . Closed fracture of right fibula and tibia 10/29/2016  . Closed tibia fracture 10/29/2016  . Closed fracture of right tibial plafond with fibula involvement 10/29/2016  . Abnormal liver function tests 03/06/2013  . RASH AND OTHER NONSPECIFIC SKIN ERUPTION 10/07/2009  . SHOULDER PAIN, LEFT 12/27/2008  . Type I (juvenile type)  diabetes mellitus without mention of complication, uncontrolled 12/03/2007  . HYPOTHYROIDISM 11/12/2007  . HYPERCHOLESTEROLEMIA 11/12/2007  . MULTIPLE SCLEROSIS 08/29/2007  . PROLIFERATIVE DIABETIC RETINOPATHY 08/29/2007  . HYPERTENSION 12/25/2006    [name], PT, DPT 10/25/17    2:37 PM    [facility] [address] [place], [place], [address] Phone: [phone]   Fax:  [phone]  Name: Johnathan Ross MRN: [id number] Date of Birth: 04-04-1971

## 2017-10-27 NOTE — Progress Notes (Signed)
Subjective:              Patient ID: Johnathan Ross, male   DOB: [birth year], 47 y.o.   MRN: [id number]  Diabetes    Diagnosis: Type 1 diabetes mellitus, date of diagnosis: 1995.   Insulin Pump followup:   CURRENT brand:  Medtronic  630 G   HISTORY: An insulin pump has been in use since 03/13/11.   PUMP settings are basal rate:   Basal rate at 11 AM = 1.7 and from 12 noon until 3 PM = 1.6   1.5 until 4 a.m; 4 AM = 1.35. 6 a.m.= 1.7. 9 AM = 1.4,11 a.m. = 0.9. 12 noon = 1.2, 2 p.m. = 1. 45. 3 PM = 2.15 and 6 p.m. = 1.8   Carbohydrate to insulin ratio: 1: 10, at 11 a.m. = 1: 10, after 6 p.m. = 1:7, hyperglycemia correction factor: 1: 30,and after 10 p.m. 1:30, target 120, active insulin time 4 hours.  Changing infusion set every 3-4 days  Recent history:  His A1c need to go up and is now 8.4%   Current blood sugar patterns Evaluated from freestyle Libre and pump download, management and problems identified:   He does not know why his A1c is higher  He has not used freestyle libre or over 2 weeks as he did not have his supplies and did not bring his previous reader  He had not been checking his blood sugars consistently  Although he has mostly high readings between 12 noon and about 8 PM he does not want to change his basal rate as he thinks his monitoring is inadequate  Also it may be that he is not bolusing consistently at certain meals even though he says sometimes he will only eat crackers and not bolus for this  He is only slightly active with working with blood work but not doing anything strenuous  Occasionally having problems with his infusion set also with blood sugars going up over 400 periodically He has not had any hypoglycemia documented recently with low sugar about 72 Treatment of hypoglycemia: He is usually drinking juice for low sugars and usually can detect symptoms of low sugars  Blood sugars from pump download:  PRE-MEAL Fasting  midday Dinner  Bedtime Overall  Glucose range:  90-282  172-259  77-399  123-400+   Mean/median:      224+/-100   Previous readings:  PRE-MEAL Fasting Lunch Dinner Bedtime Overall  Glucose range:       Mean/median:  142  162  144  193 161    Wt Readings from Last 3 Encounters:  10/28/17 213 lb (96.6 kg)  07/26/17 207 lb (93.9 kg)  05/27/17 211 lb 3.2 oz (95.8 kg)     Lab Results  Component Value Date   HGBA1C 8.4 (H) 10/23/2017   HGBA1C 8.0 (H) 07/23/2017   HGBA1C 7.4 03/21/2017   Lab Results  Component Value Date   MICROALBUR 1.6 10/27/2014   LDLCALC 70 07/23/2017   CREATININE 1.13 10/23/2017     OTHER active problems discussed today: See review of systems    Allergies as of 10/28/2017   No Known Allergies     Medication List        Accurate as of 10/28/17  8:22 AM. Always use your most recent med list.          AMPYRA 10 MG Tb12 Generic drug:  dalfampridine Take 10 mg by mouth 2 (two) times daily. Take one  tablet (10 mg) by mouth every morning and mid afternoon   glucose blood test strip Commonly known as:  CONTOUR NEXT TEST USE AS INSTRUCTED TO CHECK BLOOD SUGARS 8 TIMES PER DAY DX CODE E10.65   insulin aspart 100 UNIT/ML injection Commonly known as:  NOVOLOG USE UP TO 80 UNITS IN INSULIN PUMP DAILY-DX CODE E10.8   insulin pump Soln Inject into the skin continuous. Novolog   levothyroxine 175 MCG tablet Commonly known as:  SYNTHROID, LEVOTHROID TAKE 1 TABLET (175 MCG TOTAL) BY MOUTH DAILY BEFORE BREAKFAST.   lisinopril 10 MG tablet Commonly known as:  PRINIVIL,ZESTRIL TAKE 1 TABLET BY MOUTH EVERY DAY   simvastatin 20 MG tablet Commonly known as:  ZOCOR TAKE 1 TABLET BY MOUTH AT BEDTIME       Allergies: No Known Allergies  Past Medical History:  Diagnosis Date  . Hyperlipidemia   . Hypertension   . Hypogonadism in male 11/01/2016  . Hypothyroidism   . Multiple sclerosis ([facility])   . 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 Type 1 diabetes  mellitus ([place]) dx'd 1994  . Vitamin D insufficiency 11/29/2016    Past Surgical History:  Procedure Laterality Date  . EYE SURGERY Bilateral    "laser for diabetic retinopathy"  . FRACTURE SURGERY    . OPEN REDUCTION INTERNAL FIXATION (ORIF) TIBIA/FIBULA FRACTURE Right 10/30/2016  . ORIF ANKLE FRACTURE Left 2015  . ORIF TIBIA FRACTURE Right 10/30/2016   Procedure: OPEN REDUCTION INTERNAL FIXATION (ORIF) TIBIA FIBULA FRACTURE;  Surgeon: Altamese Blair, MD;  Location: [facility];  Service: Orthopedics;  Laterality: Right;  . RETINAL LASER PROCEDURE Bilateral    "for diabetic retinopathy"    Family History  Problem Relation Age of Onset  . Diabetes Sister   . Colon cancer Maternal Grandmother   . Colon polyps Maternal Uncle     Social History:  reports that he has never smoked. He has never used smokeless tobacco. He reports that he does not drink alcohol or use drugs.    Review of Systems      HYPERCHOLESTEROLEMIA: He has been taking simvastatin 20 mg   LDL is below 70   Also no change in ALT  Lab Results  Component Value Date   CHOL 117 07/23/2017   HDL 37.70 (L) 07/23/2017   LDLCALC 70 07/23/2017   LDLDIRECT 139.7 04/09/2014   TRIG 44.0 07/23/2017   CHOLHDL 3 07/23/2017   Lab Results  Component Value Date   ALT 15 10/23/2017    HYPERTENSION:  has  been on lisinopril with good control  of blood pressure   BP Readings from Last 3 Encounters:  10/28/17 132/72  07/26/17 134/76  05/27/17 132/76     He has MULTIPLE sclerosis, Followed in [place] by neurologist  HYPOTHYROIDISM: This is long-standing  His thyroid requirement has changed periodically and has been taking higher doses in 2018  More recently he does has been somewhat stable and is now taking a prescription for 175 mcg daily Does not feel unusually fatigued when TSH is mildly increased   Lab Results  Component Value Date   TSH 0.96 10/23/2017   TSH 1.50 07/23/2017   TSH 5.56 (H) 05/24/2017    FREET4 1.41 10/23/2017   FREET4 1.28 07/23/2017   FREET4 0.88 05/24/2017        Objective:   Physical Exam  BP 132/72 (BP Location: Left Arm, Patient Position: Sitting, Cuff Size: Large)   Pulse 91   Ht 6\' 1"  (1.854 m)  Wt 213 lb (96.6 kg)   SpO2 99%   BMI 28.10 kg/m          Assessment:      DIABETES type I on insulin pump:  See history of present illness for detailed discussion of current diabetes management, blood sugar patterns and problems identified  He has a higher A1c of 8.4%  He has overall higher blood sugars seen on his last 2 weeks download with average over 200 Factors causing high blood sugars including possibly missed boluses, not checking blood sugars at the time of bolus to do a correction, occasional infusion site issues, possibly having high fat meals without inadequate coverage and not covering for snacks Although he likely has high readings fairly consistently in the midday and afternoon he refuses to consider increasing his basal rate as he thinks he has not checked enough blood sugars and wants to wait till his freestyle libre sensor is active   HYPOTHYROIDISM:  His thyroid levels have been recently back to normal consistently with 175 mcg    HYPERTENSION: His blood pressure is consistently controlled      Plan:      He will keep a record of what he is eating for 3 different days with all meals and snacks and blood sugar before and after eating He will take this to the nurse educator to review her day-to-day management Also needs to discuss pump issues including infusion set malfunctions, site rotation with not to educator He needs to enter his blood sugar in the pump every time he is eating Bolus for all meals and snacks Do correction boluses when blood sugars are going up Will consider basal rate changes if he has more consistent high blood sugar patterns on the next visit next month  He will need to bring his urine specimen for  microalbumin which is overdue  THYROID: He will continue 175 g daily   Lipid panel to be checked on the next visit  Counseling time on subjects discussed in assessment and plan sections is over 50% of today's 25 minute visit    There are no Patient Instructions on file for this visit.     Elayne Snare

## 2017-10-28 ENCOUNTER — Encounter: Payer: Self-pay | Admitting: Endocrinology

## 2017-10-28 ENCOUNTER — Ambulatory Visit (INDEPENDENT_AMBULATORY_CARE_PROVIDER_SITE_OTHER): Payer: Medicare Other | Admitting: Endocrinology

## 2017-10-28 VITALS — BP 132/72 | HR 91 | Ht 73.0 in | Wt 213.0 lb

## 2017-10-28 DIAGNOSIS — I10 Essential (primary) hypertension: Secondary | ICD-10-CM | POA: Diagnosis not present

## 2017-10-28 DIAGNOSIS — E1065 Type 1 diabetes mellitus with hyperglycemia: Secondary | ICD-10-CM | POA: Diagnosis not present

## 2017-10-28 DIAGNOSIS — E7800 Pure hypercholesterolemia, unspecified: Secondary | ICD-10-CM

## 2017-10-28 DIAGNOSIS — E063 Autoimmune thyroiditis: Secondary | ICD-10-CM | POA: Diagnosis not present

## 2017-11-08 ENCOUNTER — Encounter: Payer: Self-pay | Admitting: Physical Therapy

## 2017-11-08 ENCOUNTER — Ambulatory Visit: Payer: Medicare Other | Attending: Orthopedic Surgery | Admitting: Physical Therapy

## 2017-11-08 DIAGNOSIS — R262 Difficulty in walking, not elsewhere classified: Secondary | ICD-10-CM

## 2017-11-08 DIAGNOSIS — M21371 Foot drop, right foot: Secondary | ICD-10-CM | POA: Diagnosis not present

## 2017-11-08 DIAGNOSIS — M6281 Muscle weakness (generalized): Secondary | ICD-10-CM | POA: Diagnosis not present

## 2017-11-08 DIAGNOSIS — R2681 Unsteadiness on feet: Secondary | ICD-10-CM | POA: Diagnosis not present

## 2017-11-08 DIAGNOSIS — M21372 Foot drop, left foot: Secondary | ICD-10-CM | POA: Diagnosis not present

## 2017-11-08 NOTE — Therapy (Signed)
[facility] [address] [place], [place], [address] Phone: [phone]   Fax:  [phone]  Physical Therapy Treatment  Patient Details  Name: Johnathan Ross MRN: [id number] Date of Birth: 06-01-1970 Referring Provider: Altamese Paris, MD   Encounter Date: 11/08/2017  PT End of Session - 11/08/17 1019    Visit Number  62    Number of Visits  53    Date for PT Re-Evaluation  12/19/17    Authorization Type  Medicare    PT Start Time  0850    PT Stop Time  0948    PT Time Calculation (min)  58 min    Equipment Utilized During Treatment  Other (comment) Bioness    Activity Tolerance  Patient tolerated treatment well    Behavior During Therapy  [facility] for tasks assessed/performed       Past Medical History:  Diagnosis Date  . Hyperlipidemia   . Hypertension   . Hypogonadism in male 11/01/2016  . Hypothyroidism   . Multiple sclerosis ([place])   . 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 Type 1 diabetes mellitus ([place]) dx'd 1994  . Vitamin D insufficiency 11/29/2016    Past Surgical History:  Procedure Laterality Date  . EYE SURGERY Bilateral    "laser for diabetic retinopathy"  . FRACTURE SURGERY    . OPEN REDUCTION INTERNAL FIXATION (ORIF) TIBIA/FIBULA FRACTURE Right 10/30/2016  . ORIF ANKLE FRACTURE Left 2015  . ORIF TIBIA FRACTURE Right 10/30/2016   Procedure: OPEN REDUCTION INTERNAL FIXATION (ORIF) TIBIA FIBULA FRACTURE;  Surgeon: Altamese Armstrong, MD;  Location: [facility];  Service: Orthopedics;  Laterality: Right;  . RETINAL LASER PROCEDURE Bilateral    "for diabetic retinopathy"    There were no vitals filed for this visit.  Subjective Assessment - 11/08/17 0856    Subjective  Pt returns with AFO; has been working on a rocking chair for his dad.  Was in his workshop all day yesterday working on it.    Patient is accompained by:  Family member    Pertinent History  MS dx about 6 years ago    How long can you  sit comfortably?  unlimited    How long can you stand comfortably?  unlimited    How long can you walk comfortably?  household distances due to LE fatigue/weakness    Patient Stated Goals  Walk without AD;  On 06/17/17 patient states "for me, coming off of that (RW) is how to measure improvement".     Currently in Pain?  No/denies                       [facility] Adult PT Treatment/Exercise - 11/08/17 1002      Ambulation/Gait   Ambulation/Gait  Yes    Ambulation/Gait Assistance  5: Supervision    Ambulation/Gait Assistance Details  gait training with Bioness in gait mode with AFO and cane over indoor surfaces; pt taking breaks intermittently when LE fatigued; only one episode of R foot drag with pt able to self correct.  Pt continues to demonstrate L lateral lean on cane during gait but pt is demonstrating increased awareness of postural control and trunk position during gait    Ambulation Distance (Feet)  115 Feet    Assistive device  Straight cane    Gait Pattern  Step-through pattern;Poor foot clearance - right;Decreased weight shift to right;Lateral trunk lean to left    Ambulation Surface  Level;Indoor  Therapeutic Activites    Therapeutic Activities  Other Therapeutic Activities    Other Therapeutic Activities  Conversation regarding purpose of core activation, timing of core activation for proximal stability during LE movement; discussed insurance threshold and purpose of taking therapeutic breaks in order to maximize therapy benefits and visits; addition of 4 more visits through end of certification period.      Knee/Hip Exercises: Standing   Forward Step Up  Right;5 sets;Hand Hold: 0;Step Height: 2" Bioness training mode, RLE stance LE    Step Down  Right;5 sets;Hand Hold: 0;Step Height: 2" Bioness training mode, RLE stance LE    Functional Squat  5 sets;Other (comment) 2 reps RLE single leg squat for eccentric control, Bioness      Knee/Hip Exercises: Seated   Sit  to Sand  5 reps;3 sets;with UE support;without UE support R/LLE with Bioness training mode, single leg      Modalities   Modalities  Electrical Stimulation      Electrical Stimulation   Electrical Stimulation Location  bilat anterior tib and R hamstring    Electrical Stimulation Action  open and closed chain ankle DF, hip extension, knee flexion    Electrical Stimulation Parameters  see settings in Tablet 1, steering electrodes    Electrical Stimulation Goals  Strength;Neuromuscular facilitation             PT Education - 11/08/17 1019    Education provided  Yes    Education Details  see TA    Person(s) Educated  Patient    Methods  Explanation    Comprehension  Verbalized understanding       PT Short Term Goals - 10/17/17 1502      PT SHORT TERM GOAL #1   Title  = LTG (cert written for 60 days but anticipate D/C at 4 weeks)        PT Long Term Goals - 10/17/17 1503      PT LONG TERM GOAL #1   Title  Will be independent with HEP with standing HEP and Bioness HEP if approved    Time  4    Period  Weeks    Status  Revised    Target Date  11/19/17      PT LONG TERM GOAL #2   Title  Pt will ambulate 150' outside on pavement and negotiate curb with supervision with AFO and cane    Baseline  115' with cane and AFO supervision-min A; 4 stairs, one rail with AFO and supervision.  Outside not assessed today    Time  4    Period  Weeks    Status  Revised    Target Date  11/19/17      PT LONG TERM GOAL #3   Title  Pt will demonstrate independence with Bioness home unit (donning/doffing, wear schedule, skin care, care of components)    Baseline  Awaiting approval    Time  4    Period  Weeks    Status  Revised    Target Date  11/19/17      PT LONG TERM GOAL #5   Title  Pt will decrease falls risk as indicated by increase in BERG score to > or = 50/56    Baseline  49/56, progressing    Time  4    Period  Weeks    Status  Revised    Target Date  11/19/17      PT  LONG TERM GOAL #7  Title  Pt will improve gait velocity to > or = 2.8 ft/sec with LRAD and appropriate AFO     Baseline  2.48 ft/sec with cane and AFO, progressing    Time  4    Period  Weeks    Status  Revised    Target Date  11/19/17            Plan - 11/08/17 1020    Clinical Impression Statement  Treatment session returned to use of Bioness in gait mode and training mode during gait with cane, during single leg sit to stand and single leg squat, and single leg step overs with decreased UE support to continue to focus on weight shifting to R initiating at trunk, trunk and postural control for proximal stability and RLE eccentric control.  Pt continues to require tactile cues for initiation of weight shift to R from trunk instead of head.  Will continue to address to progress towards LTG.    Rehab Potential  Good    Clinical Impairments Affecting Rehab Potential  MS     PT Frequency  2x / week    PT Duration  4 weeks    PT Treatment/Interventions  ADLs/Self Care Home Management;Cryotherapy;Vasopneumatic Device;Taping;Gait training;Stair training;Functional mobility training;Therapeutic activities;Therapeutic exercise;Balance training;Neuromuscular re-education;Passive range of motion;Manual techniques;Patient/family education;Electrical Stimulation;DME Instruction;Orthotic Fit/Training;Energy conservation    PT Next Visit Plan  Core training on rockerboard weight shift to R at ribs; Keep adding to MedBridge HEP; Sit <> stand single leg from elevated seat/stool/mat using BIoness on R hamstring. ABD pushing BOSU.  Side planks, quadruped core/lateral hip strengthening.  gait with new AFO, floor ladder. Continue Stepping strategy training using sports cord for perturbations-vary surfaces; during gait if you have +2.  Continue proximal hip/core strengthening with Bioness - tandem stand, side step with squat, tall kneeling and half kneel, closed chain hip ABD (tap downs laterally).  Gait with  Bioness.      Consulted and Agree with Plan of Care  Patient    Family Member Consulted  --       Patient will benefit from skilled therapeutic intervention in order to improve the following deficits and impairments:  Abnormal gait, Pain, Decreased range of motion, Decreased balance, Decreased mobility, Difficulty walking, Decreased activity tolerance, Decreased strength, Decreased endurance, Impaired flexibility, Impaired sensation  Visit Diagnosis: Difficulty in walking, not elsewhere classified  Muscle weakness (generalized)  Unsteadiness on feet  Foot drop, right  Foot drop, left     Problem List Patient Active Problem List   Diagnosis Date Noted  . Vitamin D insufficiency 11/29/2016  . Hypogonadism in male 11/01/2016  . Type 1 diabetes mellitus ([place])   . Hypothyroidism   . Hypertension   . Hyperlipidemia   . Closed fracture of right fibula and tibia 10/29/2016  . Closed tibia fracture 10/29/2016  . Closed fracture of right tibial plafond with fibula involvement 10/29/2016  . Abnormal liver function tests 03/06/2013  . RASH AND OTHER NONSPECIFIC SKIN ERUPTION 10/07/2009  . SHOULDER PAIN, LEFT 12/27/2008  . Type I (juvenile type) diabetes mellitus without mention of complication, uncontrolled 12/03/2007  . HYPOTHYROIDISM 11/12/2007  . HYPERCHOLESTEROLEMIA 11/12/2007  . MULTIPLE SCLEROSIS 08/29/2007  . PROLIFERATIVE DIABETIC RETINOPATHY 08/29/2007  . HYPERTENSION 12/25/2006    Johnathan Ross, PT, DPT 11/08/17    10:35 AM    [facility] [address] [place], [place], [address] Phone: [phone]   Fax:  [phone]  Name: Johnathan Ross MRN:  [id number] Date of Birth: 11-15-1970

## 2017-11-11 ENCOUNTER — Ambulatory Visit: Payer: Medicare Other | Admitting: Physical Therapy

## 2017-11-11 ENCOUNTER — Encounter: Payer: Self-pay | Admitting: Physical Therapy

## 2017-11-11 DIAGNOSIS — M21372 Foot drop, left foot: Secondary | ICD-10-CM

## 2017-11-11 DIAGNOSIS — R2681 Unsteadiness on feet: Secondary | ICD-10-CM

## 2017-11-11 DIAGNOSIS — M6281 Muscle weakness (generalized): Secondary | ICD-10-CM

## 2017-11-11 DIAGNOSIS — M21371 Foot drop, right foot: Secondary | ICD-10-CM | POA: Diagnosis not present

## 2017-11-11 DIAGNOSIS — R262 Difficulty in walking, not elsewhere classified: Secondary | ICD-10-CM | POA: Diagnosis not present

## 2017-11-11 NOTE — Therapy (Signed)
[facility] [address] [place], [place], [address] Phone: [phone]   Fax:  [phone]  Physical Therapy Treatment  Patient Details  Name: Johnathan Ross MRN: [id number] Date of Birth: [birth year] Referring Provider: Altamese [place], MD   Encounter Date: 11/11/2017  PT End of Session - 11/11/17 0934    Visit Number  13    Number of Visits  28    Date for PT Re-Evaluation  12/19/17    Authorization Type  Medicare    PT Start Time  0931    PT Stop Time  1015    PT Time Calculation (min)  44 min    Equipment Utilized During Treatment  Other (comment);Gait belt AFO    Activity Tolerance  Patient tolerated treatment well    Behavior During Therapy  [facility] for tasks assessed/performed       Past Medical History:  Diagnosis Date  . Hyperlipidemia   . Hypertension   . Hypogonadism in male 11/01/2016  . Hypothyroidism   . Multiple sclerosis ([place])   . 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 Type 1 diabetes mellitus ([place]) dx'd 1994  . Vitamin D insufficiency 11/29/2016    Past Surgical History:  Procedure Laterality Date  . EYE SURGERY Bilateral    "laser for diabetic retinopathy"  . FRACTURE SURGERY    . OPEN REDUCTION INTERNAL FIXATION (ORIF) TIBIA/FIBULA FRACTURE Right 10/30/2016  . ORIF ANKLE FRACTURE Left 2015  . ORIF TIBIA FRACTURE Right 10/30/2016   Procedure: OPEN REDUCTION INTERNAL FIXATION (ORIF) TIBIA FIBULA FRACTURE;  Surgeon: Altamese [place], MD;  Location: [facility];  Service: Orthopedics;  Laterality: Right;  . RETINAL LASER PROCEDURE Bilateral    "for diabetic retinopathy"    There were no vitals filed for this visit.  Subjective Assessment - 11/11/17 0933    Subjective  No new complaitns. No falls or pain to report.     Patient is accompained by:  Family member    Pertinent History  MS dx about 6 years ago    How long can you sit comfortably?  unlimited    How long can you stand comfortably?   unlimited    How long can you walk comfortably?  household distances due to LE fatigue/weakness    Patient Stated Goals  Walk without AD;  On 06/17/17 patient states "for me, coming off of that (RW) is how to measure improvement".     Currently in Pain?  No/denies          [facility] Adult PT Treatment/Exercise - 11/11/17 0937      Transfers   Transfers  Sit to Stand;Stand to Sit;Floor to Transfer    Sit to Stand  5: Supervision;With upper extremity assist    Stand to Sit  5: Supervision;With upper extremity assist      Ambulation/Gait   Ambulation/Gait  Yes    Ambulation/Gait Assistance  5: Supervision;4: Min guard    Ambulation/Gait Assistance Details  with cane/right AFO with cues on posture, weight shifting. 2 episodes of right toe scuffing with all gait with pt self correcting with min guard assist for balance.                                Ambulation Distance (Feet)  100 Feet x2, 50 x2    Assistive device  Straight cane    Gait Pattern  Step-through pattern;Poor foot clearance - right;Decreased weight shift to  right;Lateral trunk lean to left    Ambulation Surface  Level;Indoor      Neuro Re-ed    Neuro Re-ed Details   for strengthening/muscle re-ed: on red mat on floor next to blue mat- in tall kneeling: mini squats with OH press with 3# ball x 10 reps with emphasis on return to tall kneeling. in tall kneeling with green band- rows for 5 sec holds x 10 reps, shoulder extension for 5 sec x 10 reps. cues on posture and ex form/technique; in quadruped: alternating combo contralateral UE/LE raises x 10 each side with cues/assist for stability and for ex form. alternating 'fire hydrants" x 10 reps each side with cues/assist for core stabilization and ex form/technique.                 Balance Exercises - 11/11/17 2204      Balance Exercises: Standing   Rockerboard  Anterior/posterior;Lateral;Head turns;EO;EC;30 seconds;10 reps      Balance Exercises: Standing   Rebounder  Limitations  performed both ways on balance board: rocking board with EO  with emphasis on tall posture and weight shifting for balance; holding board steady- EC no head movements, progressing to EC head movements left<>right, up<>down x 10 reps each with min guard to min assist for balance.                                      PT Short Term Goals - 10/17/17 1502      PT SHORT TERM GOAL #1   Title  = LTG (cert written for 60 days but anticipate D/C at 4 weeks)        PT Long Term Goals - 10/17/17 1503      PT LONG TERM GOAL #1   Title  Will be independent with HEP with standing HEP and Bioness HEP if approved    Time  4    Period  Weeks    Status  Revised    Target Date  11/19/17      PT LONG TERM GOAL #2   Title  Pt will ambulate 150' outside on pavement and negotiate curb with supervision with AFO and cane    Baseline  115' with cane and AFO supervision-min A; 4 stairs, one rail with AFO and supervision.  Outside not assessed today    Time  4    Period  Weeks    Status  Revised    Target Date  11/19/17      PT LONG TERM GOAL #3   Title  Pt will demonstrate independence with Bioness home unit (donning/doffing, wear schedule, skin care, care of components)    Baseline  Awaiting approval    Time  4    Period  Weeks    Status  Revised    Target Date  11/19/17      PT LONG TERM GOAL #5   Title  Pt will decrease falls risk as indicated by increase in BERG score to > or = 50/56    Baseline  49/56, progressing    Time  4    Period  Weeks    Status  Revised    Target Date  11/19/17      PT LONG TERM GOAL #7   Title  Pt will improve gait velocity to > or = 2.8 ft/sec with LRAD and appropriate AFO     Baseline  2.48  ft/sec with cane and AFO, progressing    Time  4    Period  Weeks    Status  Revised    Target Date  11/19/17            Plan - 11/11/17 0934    Clinical Impression Statement  Today's skilled session focused on core/postural strengthening and balance  reactions. Utilized pt's AFO and cane with gait in session with 2 episodes of toe scuffing with pt self correcting. Pt is progressing toward goals and should benefit from continued PT to progress toward unmet goals.     Rehab Potential  Good    Clinical Impairments Affecting Rehab Potential  MS     PT Frequency  2x / week    PT Duration  4 weeks    PT Treatment/Interventions  ADLs/Self Care Home Management;Cryotherapy;Vasopneumatic Device;Taping;Gait training;Stair training;Functional mobility training;Therapeutic activities;Therapeutic exercise;Balance training;Neuromuscular re-education;Passive range of motion;Manual techniques;Patient/family education;Electrical Stimulation;DME Instruction;Orthotic Fit/Training;Energy conservation    PT Next Visit Plan  Core training on rockerboard weight shift to R at ribs; Keep adding to MedBridge HEP; Sit <> stand single leg from elevated seat/stool/mat using BIoness on R hamstring. ABD pushing BOSU.  Side planks, quadruped core/lateral hip strengthening.  gait with new AFO, floor ladder. Continue Stepping strategy training using sports cord for perturbations-vary surfaces; during gait if you have +2.  Continue proximal hip/core strengthening with Bioness - tandem stand, side step with squat, tall kneeling and half kneel, closed chain hip ABD (tap downs laterally).  Gait with Bioness.      Consulted and Agree with Plan of Care  Patient       Patient will benefit from skilled therapeutic intervention in order to improve the following deficits and impairments:  Abnormal gait, Pain, Decreased range of motion, Decreased balance, Decreased mobility, Difficulty walking, Decreased activity tolerance, Decreased strength, Decreased endurance, Impaired flexibility, Impaired sensation  Visit Diagnosis: Muscle weakness (generalized)  Unsteadiness on feet  Difficulty in walking, not elsewhere classified  Foot drop, right  Foot drop, left     Problem List Patient  Active Problem List   Diagnosis Date Noted  . Vitamin D insufficiency 11/29/2016  . Hypogonadism in male 11/01/2016  . Type 1 diabetes mellitus ([place])   . Hypothyroidism   . Hypertension   . Hyperlipidemia   . Closed fracture of right fibula and tibia 10/29/2016  . Closed tibia fracture 10/29/2016  . Closed fracture of right tibial plafond with fibula involvement 10/29/2016  . Abnormal liver function tests 03/06/2013  . RASH AND OTHER NONSPECIFIC SKIN ERUPTION 10/07/2009  . SHOULDER PAIN, LEFT 12/27/2008  . Type I (juvenile type) diabetes mellitus without mention of complication, uncontrolled 12/03/2007  . HYPOTHYROIDISM 11/12/2007  . HYPERCHOLESTEROLEMIA 11/12/2007  . MULTIPLE SCLEROSIS 08/29/2007  . PROLIFERATIVE DIABETIC RETINOPATHY 08/29/2007  . HYPERTENSION 12/25/2006    Willow Ora, PTA, [facility] [address] [place], [place] [address] [phone] 11/11/17, 10:11 PM   Name: Johnathan Ross MRN: [id number] Date of Birth: 1971/05/23

## 2017-11-12 ENCOUNTER — Encounter: Payer: Medicare Other | Admitting: Nutrition

## 2017-11-13 ENCOUNTER — Ambulatory Visit: Payer: Medicare Other | Admitting: Physical Therapy

## 2017-11-13 ENCOUNTER — Encounter: Payer: Self-pay | Admitting: Physical Therapy

## 2017-11-13 DIAGNOSIS — R2681 Unsteadiness on feet: Secondary | ICD-10-CM

## 2017-11-13 DIAGNOSIS — M21372 Foot drop, left foot: Secondary | ICD-10-CM | POA: Diagnosis not present

## 2017-11-13 DIAGNOSIS — M6281 Muscle weakness (generalized): Secondary | ICD-10-CM

## 2017-11-13 DIAGNOSIS — M21371 Foot drop, right foot: Secondary | ICD-10-CM | POA: Diagnosis not present

## 2017-11-13 DIAGNOSIS — R262 Difficulty in walking, not elsewhere classified: Secondary | ICD-10-CM

## 2017-11-13 NOTE — Therapy (Signed)
[facility] [address] [place], [place], [address] Phone: [phone]   Fax:  [phone]  Physical Therapy Treatment  Patient Details  Name: Johnathan Ross MRN: [id number] Date of Birth: 06/18/1970 Referring Provider: Altamese [place], MD   Encounter Date: 11/13/2017  PT End of Session - 11/13/17 2159    Visit Number  92    Number of Visits  82    Date for PT Re-Evaluation  12/19/17    Authorization Type  Medicare - send every 10th visit note to referring physician for PN    PT Start Time  0853    PT Stop Time  0935    PT Time Calculation (min)  42 min    Equipment Utilized During Treatment  Other (comment) AFO    Activity Tolerance  Patient tolerated treatment well    Behavior During Therapy  [facility] for tasks assessed/performed       Past Medical History:  Diagnosis Date  . Hyperlipidemia   . Hypertension   . Hypogonadism in male 11/01/2016  . Hypothyroidism   . Multiple sclerosis ([place])   . 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 Type 1 diabetes mellitus ([place]) dx'd 1994  . Vitamin D insufficiency 11/29/2016    Past Surgical History:  Procedure Laterality Date  . EYE SURGERY Bilateral    "laser for diabetic retinopathy"  . FRACTURE SURGERY    . OPEN REDUCTION INTERNAL FIXATION (ORIF) TIBIA/FIBULA FRACTURE Right 10/30/2016  . ORIF ANKLE FRACTURE Left 2015  . ORIF TIBIA FRACTURE Right 10/30/2016   Procedure: OPEN REDUCTION INTERNAL FIXATION (ORIF) TIBIA FIBULA FRACTURE;  Surgeon: Altamese [place], MD;  Location: [facility];  Service: Orthopedics;  Laterality: Right;  . RETINAL LASER PROCEDURE Bilateral    "for diabetic retinopathy"    There were no vitals filed for this visit.  Subjective Assessment - 11/13/17 0905    Subjective  No new issues to report; discussed current status with [employer].  Patient asking about Neuropsych.    Patient is accompained by:  Family member    Pertinent History  MS dx about 6  years ago    How long can you sit comfortably?  unlimited    How long can you stand comfortably?  unlimited    How long can you walk comfortably?  household distances due to LE fatigue/weakness    Patient Stated Goals  Walk without AD;  On 06/17/17 patient states "for me, coming off of that (RW) is how to measure improvement".     Currently in Pain?  No/denies                       [facility] Adult PT Treatment/Exercise - 11/13/17 2149      Therapeutic Activites    Therapeutic Activities  Other Therapeutic Activities    Other Therapeutic Activities  Continued to discuss process of obtaining home [employer] unit.  Educated pt on process, as reported by MS navigator and Psychologist, forensic, and the need for the patient to apply for other forms of financial assistance (other agencies, financial hardship application with [employer], etc).  Pt encouraged to follow up with [employer] about these options and application process.  Pt agreeable.  Also spent time discussing expertise and role of neuropsych in assessing and addressing cognitive and emotional/coping impairments due to neurological injury.  Pt is hesitant to discuss emotional coping issues because he feels it will not help.  Reassured pt that his emotions were a normal process  of dealing with a chronic neurological disease and that it may be helpful to process with a mental health professional that has clinical expertise in [place].  Pt will consider rescheduling neuropsych appointment      Neuro Re-ed    Neuro Re-ed Details   Performed postural control and weight shift training first seated on large rockerboard performing lateral weight shifting and reaching out of BOS initiating weight shift at trunk/pelvis.  Transitioned to performing same weight shift in standing on small rockerboard and then incorporating weight shift with SLS with bilat > unilateral UE support.  Demonstrated how pt could practice safely at home on solid surface.              PT Education - 11/13/17 2159    Education provided  Yes    Education Details  see TA, weight shifting and SLS at home    Person(s) Educated  Patient    Methods  Explanation    Comprehension  Verbalized understanding       PT Short Term Goals - 10/17/17 1502      PT SHORT TERM GOAL #1   Title  = LTG (cert written for 60 days but anticipate D/C at 4 weeks)        PT Long Term Goals - 10/17/17 1503      PT LONG TERM GOAL #1   Title  Will be independent with HEP with standing HEP and [employer] HEP if approved    Time  4    Period  Weeks    Status  Revised    Target Date  11/19/17      PT LONG TERM GOAL #2   Title  Pt will ambulate 150' outside on pavement and negotiate curb with supervision with AFO and cane    Baseline  115' with cane and AFO supervision-min A; 4 stairs, one rail with AFO and supervision.  Outside not assessed today    Time  4    Period  Weeks    Status  Revised    Target Date  11/19/17      PT LONG TERM GOAL #3   Title  Pt will demonstrate independence with [employer] home unit (donning/doffing, wear schedule, skin care, care of components)    Baseline  Awaiting approval    Time  4    Period  Weeks    Status  Revised    Target Date  11/19/17      PT LONG TERM GOAL #5   Title  Pt will decrease falls risk as indicated by increase in BERG score to > or = 50/56    Baseline  49/56, progressing    Time  4    Period  Weeks    Status  Revised    Target Date  11/19/17      PT LONG TERM GOAL #7   Title  Pt will improve gait velocity to > or = 2.8 ft/sec with LRAD and appropriate AFO     Baseline  2.48 ft/sec with cane and AFO, progressing    Time  4    Period  Weeks    Status  Revised    Target Date  11/19/17            Plan - 11/13/17 2200    Clinical Impression Statement  Continued to provide pt education regarding process of obtaining [employer] home unit and role of neuropsych; therapy continues to recommend pt seek treatment with  neuropsychologist.  Continued core,  trunk control and weight shift training on unstable surface and then on solid surface incorporating single limb stance.  Continues to require bilat UE support for sustained SLS.  Will continue to address and progress towards LTG.    Rehab Potential  Good    Clinical Impairments Affecting Rehab Potential  MS     PT Frequency  2x / week    PT Duration  4 weeks    PT Treatment/Interventions  ADLs/Self Care Home Management;Cryotherapy;Vasopneumatic Device;Taping;Gait training;Stair training;Functional mobility training;Therapeutic activities;Therapeutic exercise;Balance training;Neuromuscular re-education;Passive range of motion;Manual techniques;Patient/family education;Electrical Stimulation;DME Instruction;Orthotic Fit/Training;Energy conservation    PT Next Visit Plan  Pamphlet with other agencies? Did he contact [place] at [employer]?  Start checking LTG to determine main focus over next 4 weeks; still awaiting decision about [employer] home unit.  Core training on rockerboard weight shift to R at ribs + SLS; Keep adding to MedBridge HEP; Sit <> stand single leg from elevated seat/stool/mat using BIoness on R hamstring. ABD pushing BOSU.  Side planks, quadruped core/lateral hip strengthening.  gait with new AFO, floor ladder. Continue Stepping strategy training using sports cord for perturbations-vary surfaces; during gait if you have +2.  Continue proximal hip/core strengthening with [employer]ion[employer] - tandem stand, side step with squat, tall kneeling and half kneel, closed chain hip ABD (tap downs laterally).  Gait with Bion[employer]; treadmill    Consulted and Agree with Plan of Care  Patient       Patient will benefit from skilled therapeutic intervention in order to improve the following deficits and impairments:  Abnormal gait, Pain, Decreased range of motion, Decreased balance, Decreased mobility, Difficulty walking, Decreased activity tolerance, Decreased strength, Decreased  endurance, Impaired flexibility, Impaired sensation  Visit Diagnosis: Muscle weakness (generalized)  Unsteadiness on feet  Difficulty in walking, not elsewhere classified  Foot drop, right  Foot drop, left     Problem List Patient Active Problem List   Diagnosis Date Noted  . Vitamin D insufficiency 11/29/2016  . Hypogonadism in male 11/01/2016  . Type 1 diabetes mellitus ([place])   . Hypothyroidism   . Hypertension   . Hyperlipidemia   . Closed fracture of right fibula and tibia 10/29/2016  . Closed tibia fracture 10/29/2016  . Closed fracture of right tibial plafond with fibula involvement 10/29/2016  . Abnormal liver function tests 03/06/2013  . RASH AND OTHER NONSPECIFIC SKIN ERUPTION 10/07/2009  . SHOULDER PAIN, LEFT 12/27/2008  . Type I (juvenile type) diabetes mellitus without mention of complication, uncontrolled 12/03/2007  . HYPOTHYROIDISM 11/12/2007  . HYPERCHOLESTEROLEMIA 11/12/2007  . MULTIPLE SCLEROSIS 08/29/2007  . PROLIFERATIVE DIABETIC RETINOPATHY 08/29/2007  . HYPERTENSION 12/25/2006    [name], PT, DPT 11/13/17    10:07 PM    [facility] [address] [place], [place], [address] Phone: [phone]   Fax:  [phone]  Name: Johnathan Ross MRN: [id number] Date of Birth: 1970-07-04

## 2017-11-18 ENCOUNTER — Ambulatory Visit: Payer: Medicare Other | Admitting: Physical Therapy

## 2017-11-18 ENCOUNTER — Encounter: Payer: Self-pay | Admitting: Physical Therapy

## 2017-11-18 DIAGNOSIS — M21371 Foot drop, right foot: Secondary | ICD-10-CM

## 2017-11-18 DIAGNOSIS — M21372 Foot drop, left foot: Secondary | ICD-10-CM

## 2017-11-18 DIAGNOSIS — M6281 Muscle weakness (generalized): Secondary | ICD-10-CM | POA: Diagnosis not present

## 2017-11-18 DIAGNOSIS — R262 Difficulty in walking, not elsewhere classified: Secondary | ICD-10-CM

## 2017-11-18 DIAGNOSIS — R2681 Unsteadiness on feet: Secondary | ICD-10-CM

## 2017-11-18 NOTE — Therapy (Signed)
[facility] [address] [place], [place], [address] Phone: [phone]   Fax:  [phone]  Physical Therapy Treatment  Patient Details  Name: Johnathan Ross MRN: [id number] Date of Birth: Jun 16, 1970 Referring Provider: Altamese [place], MD   Encounter Date: 11/18/2017  PT End of Session - 11/18/17 1012    Visit Number  64    Number of Visits  82    Date for PT Re-Evaluation  12/19/17    Authorization Type  Medicare - send every 10th visit note to referring physician for PN    PT Start Time  0849    PT Stop Time  0936    PT Time Calculation (min)  47 min    Equipment Utilized During Treatment  Other (comment) AFO, [employer]    Activity Tolerance  Patient tolerated treatment well    Behavior During Therapy  [facility] for tasks assessed/performed       Past Medical History:  Diagnosis Date  . Hyperlipidemia   . Hypertension   . Hypogonadism in male 11/01/2016  . Hypothyroidism   . Multiple sclerosis ([place])   . 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 Type 1 diabetes mellitus ([place]) dx'd 1994  . Vitamin D insufficiency 11/29/2016    Past Surgical History:  Procedure Laterality Date  . EYE SURGERY Bilateral    "laser for diabetic retinopathy"  . FRACTURE SURGERY    . OPEN REDUCTION INTERNAL FIXATION (ORIF) TIBIA/FIBULA FRACTURE Right 10/30/2016  . ORIF ANKLE FRACTURE Left 2015  . ORIF TIBIA FRACTURE Right 10/30/2016   Procedure: OPEN REDUCTION INTERNAL FIXATION (ORIF) TIBIA FIBULA FRACTURE;  Surgeon: Altamese [place], MD;  Location: [facility];  Service: Orthopedics;  Laterality: Right;  . RETINAL LASER PROCEDURE Bilateral    "for diabetic retinopathy"    There were no vitals filed for this visit.  Subjective Assessment - 11/18/17 1003    Subjective  Has been practicing single leg stand and weight shifting at counter top at home.  Contacted [employer] and left a message but has not heard back.    Patient is accompained by:   Family member    Pertinent History  MS dx about 6 years ago    How long can you sit comfortably?  unlimited    How long can you stand comfortably?  unlimited    How long can you walk comfortably?  household distances due to LE fatigue/weakness    Patient Stated Goals  Walk without AD;  On 06/17/17 patient states "for me, coming off of that (RW) is how to measure improvement".     Currently in Pain?  No/denies                       [facility] Adult PT Treatment/Exercise - 11/18/17 1004      Ambulation/Gait   Ambulation/Gait  Yes    Ambulation/Gait Assistance  4: Min guard    Ambulation/Gait Assistance Details  gait training outside with use of BIONESS in gait mode but with R lower cuff turned off due to impaired timing with AFO causing pt to catch his foot and experience 3 significant LOB.  Gait outside focused on ambulation over uneven paved surfaces (sidewalk and black top pavement), up/down inclined sidewalk, curb and gait with narrow BOS when ambulating between two cars or car and curb.  Also performed 2 laps ambulation across gravel to simulate patient's drive way.     Ambulation Distance (Feet)  500 Feet  Assistive device  Straight cane    Gait Pattern  Step-through pattern;Poor foot clearance - right    Ambulation Surface  Unlevel;Outdoor;Paved;Gravel    Curb  4: Min assist    Curb Details (indicate cue type and reason)  with cane and Bioness in gait mode; no cues for sequencing.  Min A only for safety      Neuro Re-ed    Neuro Re-ed Details   Performed postural control and weight shift training in standing on rockerboard to L and R stopping in middle (one UE support on counter, one reaching up and out of BOS touching top cabinet door) and then transitioning to continuous weight shift from L<>R without stopping in midline and with both hands reaching up to cabinet.  Performed weight shift with SLS with bilat UE support 2x each LE.        Modalities   Modalities  Transport planner Location  bilat anterior tib and R hamstring    Electrical Stimulation Action  open and closed chain ankle DF, hip extension, knee flexion    Electrical Stimulation Parameters  New settings saved in Tablet 2; steering electrodes bilaterally.  Keep AFO on RLE, intensity turned down    Electrical Stimulation Goals  Strength;Neuromuscular facilitation             PT Education - 11/18/17 1012    Education provided  Yes    Education Details  gait outisde    Person(s) Educated  Patient    Methods  Explanation    Comprehension  Verbalized understanding       PT Short Term Goals - 10/17/17 1502      PT SHORT TERM GOAL #1   Title  = LTG (cert written for 60 days but anticipate D/C at 4 weeks)        PT Long Term Goals - 11/19/17 1019      PT LONG TERM GOAL #1   Title  Will be independent with HEP with standing HEP and [employer] HEP if approved    Time  4    Period  Weeks    Status  On-going    Target Date  11/19/17      PT LONG TERM GOAL #2   Title  Pt will ambulate 150' outside on pavement and negotiate curb with supervision with AFO and cane    Baseline  performed with min A    Time  4    Period  Weeks    Status  Partially Met      PT LONG TERM GOAL #3   Title  Pt will demonstrate independence with [employer] home unit (donning/doffing, wear schedule, skin care, care of components)    Baseline  Awaiting approval    Time  4    Period  Weeks    Status  On-going    Target Date  11/19/17      PT LONG TERM GOAL #5   Title  Pt will decrease falls risk as indicated by increase in BERG score to > or = 50/56    Baseline  49/56, progressing    Time  4    Period  Weeks    Status  On-going    Target Date  11/19/17      PT LONG TERM GOAL #7   Title  Pt will improve gait velocity to > or = 2.8 ft/sec with LRAD and appropriate AFO  Baseline  2.48 ft/sec with cane and AFO, progressing    Time  4    Period   Weeks    Status  On-going    Target Date  11/19/17            Plan - 11/19/17 1016    Clinical Impression Statement  Initiated assessment of progress towards LTG to determine if pt will require final 4 weeks of certification period.  Performed gait training and assessment outside over paved surfaces with cane and use of AFO and Bioness with pt demonstrating improved endurance and safety and requiring decreased assistance from therapist to maintain balance and foot clearance even with curbs and narrow gait between cars; min A required today - improved but not to goal of supervision level.  Continued to focus on weight shifting and SLS stability but returned to unstable surface today.  Will continue to assess remaining goals and update POC; anticipate pt will benefit from 4 more weeks to reach LTG and continue to pursue home [employer] unit.      Rehab Potential  Good    Clinical Impairments Affecting Rehab Potential  MS     PT Frequency  2x / week    PT Duration  4 weeks    PT Treatment/Interventions  ADLs/Self Care Home Management;Cryotherapy;Vasopneumatic Device;Taping;Gait training;Stair training;Functional mobility training;Therapeutic activities;Therapeutic exercise;Balance training;Neuromuscular re-education;Passive range of motion;Manual techniques;Patient/family education;Electrical Stimulation;DME Instruction;Orthotic Fit/Training;Energy conservation    PT Next Visit Plan  Remind him to bring in pamphlet with other agencies for [employer] financial assistance.  FINISH CHECKING LTG (BERG and Gait velocity) TO determine main focus over next 4 weeks- SEND TO ME FOR 10TH VISIT PN AND TO UPDATE VISIT #; still awaiting decision about [employer] home unit.  Core training on rockerboard weight shift to R at ribs + SLS; Keep adding to MedBridge HEP; Sit <> stand single leg from elevated seat/stool/mat using BIoness on R hamstring. ABD pushing BOSU.  Side planks, quadruped core/lateral hip strengthening.   gait with new AFO, floor ladder. Continue Stepping strategy training using sports cord for perturbations-vary surfaces; during gait if you have +2.  Continue proximal hip/core strengthening with Bioness - tandem stand, side step with squat, tall kneeling and half kneel, closed chain hip ABD (tap downs laterally).  Gait with Bioness; treadmill    Consulted and Agree with Plan of Care  Patient       Patient will benefit from skilled therapeutic intervention in order to improve the following deficits and impairments:  Abnormal gait, Pain, Decreased range of motion, Decreased balance, Decreased mobility, Difficulty walking, Decreased activity tolerance, Decreased strength, Decreased endurance, Impaired flexibility, Impaired sensation  Visit Diagnosis: Muscle weakness (generalized)  Unsteadiness on feet  Difficulty in walking, not elsewhere classified  Foot drop, right  Foot drop, left     Problem List Patient Active Problem List   Diagnosis Date Noted  . Vitamin D insufficiency 11/29/2016  . Hypogonadism in male 11/01/2016  . Type 1 diabetes mellitus ([place])   . Hypothyroidism   . Hypertension   . Hyperlipidemia   . Closed fracture of right fibula and tibia 10/29/2016  . Closed tibia fracture 10/29/2016  . Closed fracture of right tibial plafond with fibula involvement 10/29/2016  . Abnormal liver function tests 03/06/2013  . RASH AND OTHER NONSPECIFIC SKIN ERUPTION 10/07/2009  . SHOULDER PAIN, LEFT 12/27/2008  . Type I (juvenile type) diabetes mellitus without mention of complication, uncontrolled 12/03/2007  . HYPOTHYROIDISM 11/12/2007  . HYPERCHOLESTEROLEMIA 11/12/2007  . MULTIPLE  SCLEROSIS 08/29/2007  . PROLIFERATIVE DIABETIC RETINOPATHY 08/29/2007  . HYPERTENSION 12/25/2006    [name], PT, DPT 11/19/17    10:25 AM    [facility] [address] [place], [place], [address] Phone: [phone]   Fax:   [phone]  Name: Johnathan Ross MRN: [id number] Date of Birth: 1970/09/20

## 2017-11-21 ENCOUNTER — Encounter: Payer: Self-pay | Admitting: Physical Therapy

## 2017-11-21 ENCOUNTER — Ambulatory Visit: Payer: Medicare Other | Admitting: Physical Therapy

## 2017-11-21 DIAGNOSIS — M21372 Foot drop, left foot: Secondary | ICD-10-CM | POA: Diagnosis not present

## 2017-11-21 DIAGNOSIS — M6281 Muscle weakness (generalized): Secondary | ICD-10-CM

## 2017-11-21 DIAGNOSIS — M21371 Foot drop, right foot: Secondary | ICD-10-CM | POA: Diagnosis not present

## 2017-11-21 DIAGNOSIS — R262 Difficulty in walking, not elsewhere classified: Secondary | ICD-10-CM | POA: Diagnosis not present

## 2017-11-21 DIAGNOSIS — R2681 Unsteadiness on feet: Secondary | ICD-10-CM | POA: Diagnosis not present

## 2017-11-21 NOTE — Therapy (Signed)
[facility] [address] [place], [place], [address] Phone: [phone]   Fax:  [phone]  Physical Therapy Treatment and 10th visit PN  Patient Details  Name: Johnathan Ross MRN: [id number] Date of Birth: [birth year] Referring Provider: Altamese [place], MD   Encounter Date: 11/21/2017   Progress Note Reporting Period 10/03/2017 to 11/21/2017  See note below for Objective Data and Assessment of Progress/Goals.     PT End of Session - 11/21/17 1700    Visit Number  80    Number of Visits  88 # updated; pt will continue 4 more weeks    Date for PT Re-Evaluation  12/19/17    Authorization Type  Medicare - send every 10th visit note to referring physician for PN    PT Start Time  0846    PT Stop Time  0932    PT Time Calculation (min)  46 min    Equipment Utilized During Treatment  Other (comment) AFO, Bioness    Activity Tolerance  Patient tolerated treatment well    Behavior During Therapy  [facility] for tasks assessed/performed       Past Medical History:  Diagnosis Date  . Hyperlipidemia   . Hypertension   . Hypogonadism in male 11/01/2016  . Hypothyroidism   . Multiple sclerosis ([place])   . 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 Type 1 diabetes mellitus ([place]) dx'd 1994  . Vitamin D insufficiency 11/29/2016    Past Surgical History:  Procedure Laterality Date  . EYE SURGERY Bilateral    "laser for diabetic retinopathy"  . FRACTURE SURGERY    . OPEN REDUCTION INTERNAL FIXATION (ORIF) TIBIA/FIBULA FRACTURE Right 10/30/2016  . ORIF ANKLE FRACTURE Left 2015  . ORIF TIBIA FRACTURE Right 10/30/2016   Procedure: OPEN REDUCTION INTERNAL FIXATION (ORIF) TIBIA FIBULA FRACTURE;  Surgeon: Altamese , MD;  Location: [facility];  Service: Orthopedics;  Laterality: Right;  . RETINAL LASER PROCEDURE Bilateral    "for diabetic retinopathy"    There were no vitals filed for this visit.  Subjective Assessment - 11/21/17  0852    Subjective  Did not bring in pamphlet this week; has not heard from [employer] after leaving a message.  No issues to report.    Patient is accompained by:  Family member    Pertinent History  MS dx about 6 years ago    How long can you sit comfortably?  unlimited    How long can you stand comfortably?  unlimited    How long can you walk comfortably?  household distances due to LE fatigue/weakness    Patient Stated Goals  Walk without AD;  On 06/17/17 patient states "for me, coming off of that (RW) is how to measure improvement".     Currently in Pain?  No/denies         [facility] PT Assessment - 11/21/17 0853      Standardized Balance Assessment   10 Meter Walk  22 seconds or 1.49 ft/sec with cane and AFO; one episode of catching foot and one standing rest break.  Pt ambulates slower in more crowded environment  (Pended)                    [facility] Adult PT Treatment/Exercise - 11/21/17 1657      Modalities   Modalities  Electrical Stimulation      Electrical Stimulation   Electrical Stimulation Location  L anterior tib, R hamstring.  R lower cuff donned but  no stim provided    Electrical Stimulation Action  open and closed chain ankle DF, hip extension, knee flexion    Electrical Stimulation Parameters  Saved in tablet 1    Electrical Stimulation Goals  Strength;Neuromuscular facilitation          Balance Exercises - 11/21/17 0923      Balance Exercises: Standing   Tandem Stance  Eyes open;2 reps;30 secs    Turning  Right;Left;5 reps;3 reps with Bioness and cane, 90, 180 deg          PT Short Term Goals - 10/17/17 1502      PT SHORT TERM GOAL #1   Title  = LTG (cert written for 60 days but anticipate D/C at 4 weeks)        PT Long Term Goals - 11/21/17 1705      PT LONG TERM GOAL #1   Title  Will be independent with HEP with standing HEP and [employer] HEP if approved    Baseline  performing current HEP, still awaiting [employer] decision    Time  4     Period  Weeks    Status  On-going    Target Date  12/19/17      PT LONG TERM GOAL #2   Title  Pt will ambulate 150' outside on pavement and negotiate curb with supervision with AFO and cane    Baseline  performed with min A    Time  4    Period  Weeks    Status  On-going    Target Date  12/19/17      PT LONG TERM GOAL #3   Title  Pt will demonstrate independence with [employer] home unit (donning/doffing, wear schedule, skin care, care of components)    Baseline  Awaiting approval    Time  4    Period  Weeks    Status  On-going    Target Date  12/19/17      PT LONG TERM GOAL #5   Title  Pt will decrease falls risk as indicated by increase in BERG score to > or = 50/56    Baseline  49/56, progressing    Time  4    Period  Weeks    Status  On-going    Target Date  12/19/17      PT LONG TERM GOAL #7   Title  Pt will improve gait velocity to > or = 2.8 ft/sec with LRAD and appropriate AFO     Baseline  2.48 ft/sec with cane and AFO, progressing    Time  4    Period  Weeks    Status  On-going    Target Date  12/19/17            Plan - 11/21/17 1701    Clinical Impression Statement  Completed assessment of gait velocity with SPC and AFO with pt demonstrating a decrease in gait velocity; pt reports that in crowded environments he is slower and more conscious of his gait which can cause him to catch his foot or require more rest breaks.  Instead of re-assessing BERG, incorporated BERG activities into balance training and NMR with Bioness.  Performed turns and partial tandem stance in the corner with Bioness in training mode focusing on ankle and proximal hip strengthening for balance and stability.  Pt is not at goal level and is still awaiting decision regarding B[employer] once he begins the process of applying for financial assistance.  Will continue  to address over next 4 weeks to progress towards LTG.    Rehab Potential  Good    Clinical Impairments Affecting Rehab Potential  MS      PT Frequency  2x / week    PT Duration  4 weeks    PT Treatment/Interventions  ADLs/Self Care Home Management;Cryotherapy;Vasopneumatic Device;Taping;Gait training;Stair training;Functional mobility training;Therapeutic activities;Therapeutic exercise;Balance training;Neuromuscular re-education;Passive range of motion;Manual techniques;Patient/family education;Electrical Stimulation;DME Instruction;Orthotic Fit/Training;Energy conservation    PT Next Visit Plan  Remind him to bring in pamphlet with other agencies for [employer] financial assistance. still awaiting decision about [employer] home unit.  Balance in corner: feet together, tandem, turns.  Core training on rockerboard weight shift to R at ribs + SLS; Keep adding to MedBridge HEP; Sit <> stand single leg from elevated seat/stool/mat using BIoness on R hamstring. ABD pushing BOSU.  Side planks, quadruped core/lateral hip strengthening.  gait with new AFO, floor ladder. Continue Stepping strategy training using sports cord for perturbations-vary surfaces; during gait if you have +2.  Continue proximal hip/core strengthening with Bioness - tandem stand, side step with squat, tall kneeling and half kneel, closed chain hip ABD (tap downs laterally).  Gait with Bioness; treadmill    Consulted and Agree with Plan of Care  Patient       Patient will benefit from skilled therapeutic intervention in order to improve the following deficits and impairments:  Abnormal gait, Pain, Decreased range of motion, Decreased balance, Decreased mobility, Difficulty walking, Decreased activity tolerance, Decreased strength, Decreased endurance, Impaired flexibility, Impaired sensation  Visit Diagnosis: Muscle weakness (generalized)  Unsteadiness on feet  Difficulty in walking, not elsewhere classified  Foot drop, right  Foot drop, left     Problem List Patient Active Problem List   Diagnosis Date Noted  . Vitamin D insufficiency 11/29/2016  . Hypogonadism  in male 11/01/2016  . Type 1 diabetes mellitus ([place])   . Hypothyroidism   . Hypertension   . Hyperlipidemia   . Closed fracture of right fibula and tibia 10/29/2016  . Closed tibia fracture 10/29/2016  . Closed fracture of right tibial plafond with fibula involvement 10/29/2016  . Abnormal liver function tests 03/06/2013  . RASH AND OTHER NONSPECIFIC SKIN ERUPTION 10/07/2009  . SHOULDER PAIN, LEFT 12/27/2008  . Type I (juvenile type) diabetes mellitus without mention of complication, uncontrolled 12/03/2007  . HYPOTHYROIDISM 11/12/2007  . HYPERCHOLESTEROLEMIA 11/12/2007  . MULTIPLE SCLEROSIS 08/29/2007  . PROLIFERATIVE DIABETIC RETINOPATHY 08/29/2007  . HYPERTENSION 12/25/2006   [name], PT, DPT 11/21/17    5:10 PM    [facility] [address] [place], [place], [address] Phone: [phone]   Fax:  [phone]  Name: Johnathan Ross MRN: [id number] Date of Birth: [birth year]

## 2017-11-26 ENCOUNTER — Ambulatory Visit: Payer: Medicare Other | Admitting: Physical Therapy

## 2017-11-29 ENCOUNTER — Encounter: Payer: Self-pay | Admitting: Rehabilitation

## 2017-11-29 ENCOUNTER — Ambulatory Visit: Payer: Medicare Other | Attending: Orthopedic Surgery | Admitting: Rehabilitation

## 2017-11-29 DIAGNOSIS — M6281 Muscle weakness (generalized): Secondary | ICD-10-CM | POA: Diagnosis not present

## 2017-11-29 DIAGNOSIS — R2681 Unsteadiness on feet: Secondary | ICD-10-CM | POA: Insufficient documentation

## 2017-11-29 DIAGNOSIS — M21372 Foot drop, left foot: Secondary | ICD-10-CM | POA: Insufficient documentation

## 2017-11-29 DIAGNOSIS — R262 Difficulty in walking, not elsewhere classified: Secondary | ICD-10-CM | POA: Diagnosis not present

## 2017-11-29 DIAGNOSIS — M21371 Foot drop, right foot: Secondary | ICD-10-CM | POA: Insufficient documentation

## 2017-11-29 NOTE — Patient Instructions (Signed)
Quad Set    Slowly tighten muscles on thigh of straight leg while counting out loud to __5__. Repeat with other leg.  Do in a force that is not painful.  Repeat __10__ times. Do _1-2___ sessions per day.  http://gt2.exer.us/361   Copyright  VHI. All rights reserved.   Straight Leg Raise    Tighten stomach and slowly raise locked right leg __2-3__ inches from floor. Repeat _10___ times per set. Do __1__ sets per session. Do _1-2___ sessions per day.  http://orth.exer.us/1102   Copyright  VHI. All rights reserved.   Heel Slides    Squeeze pelvic floor and hold. Slide left heel along bed towards bottom. Hold for _2-3__ seconds. Slide back to flat knee position. Repeat _10__ times. Do _1-2__ times a day.     Copyright  VHI. All rights reserved.   Long [employer]    Straighten operated leg (only in pain free range) and try to hold it __2-3__ seconds. No weight on your ankle.   Repeat _10___ times. Do __1-2__ sessions a day.  http://gt2.exer.us/310   Copyright  VHI. All rights reserved.

## 2017-11-29 NOTE — Therapy (Signed)
[facility] [address] [place], [place], [address] Phone: [phone]   Fax:  [phone]  Physical Therapy Treatment  Patient Details  Name: Johnathan Ross MRN: [id number] Date of Birth: [birth year] Referring Provider: Altamese [place], MD   Encounter Date: 11/29/2017  PT End of Session - 11/29/17 1032    Visit Number  76    Number of Visits  88 # updated; pt will continue 4 more weeks    Date for PT Re-Evaluation  12/19/17    Authorization Type  Medicare - send every 10th visit note to referring physician for PN    PT Start Time  0846    PT Stop Time  0932    PT Time Calculation (min)  46 min    Equipment Utilized During Treatment  Other (comment) AFO, Bioness    Activity Tolerance  Patient tolerated treatment well    Behavior During Therapy  [facility] for tasks assessed/performed       Past Medical History:  Diagnosis Date  . Hyperlipidemia   . Hypertension   . Hypogonadism in male 11/01/2016  . Hypothyroidism   . Multiple sclerosis ([place])   . 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 Type 1 diabetes mellitus ([place]) dx'd 1994  . Vitamin D insufficiency 11/29/2016    Past Surgical History:  Procedure Laterality Date  . EYE SURGERY Bilateral    "laser for diabetic retinopathy"  . FRACTURE SURGERY    . OPEN REDUCTION INTERNAL FIXATION (ORIF) TIBIA/FIBULA FRACTURE Right 10/30/2016  . ORIF ANKLE FRACTURE Left 2015  . ORIF TIBIA FRACTURE Right 10/30/2016   Procedure: OPEN REDUCTION INTERNAL FIXATION (ORIF) TIBIA FIBULA FRACTURE;  Surgeon: Altamese [place], MD;  Location: [facility];  Service: Orthopedics;  Laterality: Right;  . RETINAL LASER PROCEDURE Bilateral    "for diabetic retinopathy"    There were no vitals filed for this visit.  Subjective Assessment - 11/29/17 0850    Subjective  Was on lawn mower far from house, it ran out of gas and he did not have cane with him, therefore crawled all the way to house.  Had  significant abrasion on R knee and pain.      Pertinent History  MS dx about 6 years ago    How long can you sit comfortably?  unlimited    How long can you stand comfortably?  unlimited    How long can you walk comfortably?  household distances due to LE fatigue/weakness    Diagnostic tests  X-ray    Patient Stated Goals  Walk without AD;  On 06/17/17 patient states "for me, coming off of that (RW) is how to measure improvement".     Currently in Pain?  Yes    Pain Score  1     Pain Location  Knee    Pain Orientation  Right    Pain Descriptors / Indicators  Aching    Pain Type  Acute pain    Pain Onset  1 to 4 weeks ago    Pain Frequency  Intermittent    Aggravating Factors   walking    Pain Relieving Factors  neosporin on cut, nothing else, not walking                       [facility] Adult PT Treatment/Exercise - 11/29/17 0850      Ambulation/Gait   Ambulation/Gait  Yes    Ambulation/Gait Assistance  4: Min assist;3: Mod assist  Ambulation/Gait Assistance Details  Pt ambulatory into clinic with tripod cane, however due to increased pain with loading RLE and possible increase in spasticity (from pain) pt very unsteady and needing several standing breaks to adjust balance.  Cues for cane placement and improved R LE cleraance.  Note that pt only able to ambulate up to 10-20' at most before needing to stop or needing min to mod A to prevent LOB from R toe catching or very wide unsteady gait pattern.  Note mild improvement following ice and gentle therex, however still requires min A at all times.  PT recommend he use RW at least for now and the weekend to prevent fall.  Pt hesitant in willingness to do so not wanting to "go back to walker."  However highly fear he will fall without RW at this time.  Pt verbalized understanding and mother also notified of PTs recommendations.     Ambulation Distance (Feet)  115 Feet 65' into and out of clinic    Assistive device  -- tripod cane     Gait Pattern  Step-through pattern;Poor foot clearance - right;Lateral hip instability;Wide base of support    Ambulation Surface  Level;Indoor      Self-Care   Self-Care  Heat/Ice Application    Heat/Ice Application  Provided education on use of ice massage for R patellar tendon region and was able to demonstrate during session x 5 mins.  Recommend ice (either ice massage-x 5 mins as mentioned or ice pack -x 20 mins) over the weekend to decrease inflammation.        Therapeutic Activites    Therapeutic Activities  Other Therapeutic Activities    Other Therapeutic Activities  Assessed source of knee pain during session with noted pin point tenderness to palpation along R patellar tendon and also with medial to lateral patellar glide.  Grind test negative, Varus/valgus test negative, patellar apprehension test negative, stutter test negative for medial plica irritation.  Pt seems to have patellar tendonitis however pt did not report pain with resisted knee extension).  See above for recommendations on ice.  See below for gentle therex performed.       Exercises   Exercises  Knee/Hip      Lumbar Exercises: Seated   Long Arc Quad on Chair  Strengthening;Right;1 set;10 reps within pain free range      Lumbar Exercises: Supine   Clam  10 reps    Heel Slides  10 reps with shoe removed for decreased friction    Straight Leg Raise  10 reps within pain free range (2-3" lift)      Knee/Hip Exercises: Supine   Quad Sets  Strengthening;Right;10 reps pain free contraction             PT Education - 11/29/17 0852    Education provided  Yes    Education Details  see self care and gait section    Person(s) Educated  Patient;Parent(s)    Methods  Explanation    Comprehension  Verbalized understanding       PT Short Term Goals - 10/17/17 1502      PT SHORT TERM GOAL #1   Title  = LTG (cert written for 60 days but anticipate D/C at 4 weeks)        PT Long Term Goals - 11/21/17 1705       PT LONG TERM GOAL #1   Title  Will be independent with HEP with standing HEP and [employer] HEP if approved  Baseline  performing current HEP, still awaiting [employer] decision    Time  4    Period  Weeks    Status  On-going    Target Date  12/19/17      PT LONG TERM GOAL #2   Title  Pt will ambulate 150' outside on pavement and negotiate curb with supervision with AFO and cane    Baseline  performed with min A    Time  4    Period  Weeks    Status  On-going    Target Date  12/19/17      PT LONG TERM GOAL #3   Title  Pt will demonstrate independence with [employer] home unit (donning/doffing, wear schedule, skin care, care of components)    Baseline  Awaiting approval    Time  4    Period  Weeks    Status  On-going    Target Date  12/19/17      PT LONG TERM GOAL #5   Title  Pt will decrease falls risk as indicated by increase in BERG score to > or = 50/56    Baseline  49/56, progressing    Time  4    Period  Weeks    Status  On-going    Target Date  12/19/17      PT LONG TERM GOAL #7   Title  Pt will improve gait velocity to > or = 2.8 ft/sec with LRAD and appropriate AFO     Baseline  2.48 ft/sec with cane and AFO, progressing    Time  4    Period  Weeks    Status  On-going    Target Date  12/19/17            Plan - 11/29/17 1033    Clinical Impression Statement  Skilled session focused on assessment of R knee pain.  Note that pt did not have a fall to knee however crawled from lawn mower approx 100' to house.  Pt seems to have a patellar tendonitis with tenderness to palpation right over inferior aspect of patella and pain during transitional movements (sit<>stand).  Recommended ice massage or ice pack and gentle therex over the weekend.  Will re-assess at next visit to determine if healing.  Also recommended use of RW over weekend to prevent fall.      Rehab Potential  Good    Clinical Impairments Affecting Rehab Potential  MS     PT Frequency  2x / week    PT  Duration  4 weeks    PT Treatment/Interventions  ADLs/Self Care Home Management;Cryotherapy;Vasopneumatic Device;Taping;Gait training;Stair training;Functional mobility training;Therapeutic activities;Therapeutic exercise;Balance training;Neuromuscular re-education;Passive range of motion;Manual techniques;Patient/family education;Electrical Stimulation;DME Instruction;Orthotic Fit/Training;Energy conservation    PT Next Visit Plan  Check knee again-should we send him to MD if still hurting? Remind him to bring in pamphlet with other agencies for [employer] financial assistance. still awaiting decision about [employer] home unit.  Balance in corner: feet together, tandem, turns.  Core training on rockerboard weight shift to R at ribs + SLS; Keep adding to MedBridge HEP; Sit <> stand single leg from elevated seat/stool/mat using BIoness on R hamstring. ABD pushing BOSU.  Side planks, quadruped core/lateral hip strengthening.  gait with new AFO, floor ladder. Continue Stepping strategy training using sports cord for perturbations-vary surfaces; during gait if you have +2.  Continue proximal hip/core strengthening with Bioness - tandem stand, side step with squat, tall kneeling and half kneel, closed chain hip ABD (  tap downs laterally).  Gait with Bioness; treadmill    Consulted and Agree with Plan of Care  Patient       Patient will benefit from skilled therapeutic intervention in order to improve the following deficits and impairments:  Abnormal gait, Pain, Decreased range of motion, Decreased balance, Decreased mobility, Difficulty walking, Decreased activity tolerance, Decreased strength, Decreased endurance, Impaired flexibility, Impaired sensation  Visit Diagnosis: Muscle weakness (generalized)  Unsteadiness on feet  Difficulty in walking, not elsewhere classified     Problem List Patient Active Problem List   Diagnosis Date Noted  . Vitamin D insufficiency 11/29/2016  . Hypogonadism in male  11/01/2016  . Type 1 diabetes mellitus ([place])   . Hypothyroidism   . Hypertension   . Hyperlipidemia   . Closed fracture of right fibula and tibia 10/29/2016  . Closed tibia fracture 10/29/2016  . Closed fracture of right tibial plafond with fibula involvement 10/29/2016  . Abnormal liver function tests 03/06/2013  . RASH AND OTHER NONSPECIFIC SKIN ERUPTION 10/07/2009  . SHOULDER PAIN, LEFT 12/27/2008  . Type I (juvenile type) diabetes mellitus without mention of complication, uncontrolled 12/03/2007  . HYPOTHYROIDISM 11/12/2007  . HYPERCHOLESTEROLEMIA 11/12/2007  . MULTIPLE SCLEROSIS 08/29/2007  . PROLIFERATIVE DIABETIC RETINOPATHY 08/29/2007  . HYPERTENSION 12/25/2006    Cameron Sprang, PT, MPT [facility] [address] [place], [place], [address] Phone: [phone]   Fax:  [phone] 11/29/17, 10:37 AM  Name: Johnathan Ross MRN: [id number] Date of Birth: 09-Apr-1971

## 2017-12-02 ENCOUNTER — Encounter (HOSPITAL_COMMUNITY): Payer: Self-pay | Admitting: Emergency Medicine

## 2017-12-02 ENCOUNTER — Ambulatory Visit (INDEPENDENT_AMBULATORY_CARE_PROVIDER_SITE_OTHER): Payer: Medicare Other

## 2017-12-02 ENCOUNTER — Ambulatory Visit (HOSPITAL_COMMUNITY)
Admission: EM | Admit: 2017-12-02 | Discharge: 2017-12-02 | Disposition: A | Discharge status: Home / Self Care | Payer: Medicare Other | Attending: Family Medicine | Admitting: Family Medicine

## 2017-12-02 DIAGNOSIS — M25561 Pain in right knee: Secondary | ICD-10-CM | POA: Diagnosis not present

## 2017-12-02 MED ORDER — DICLOFENAC SODIUM 75 MG PO TBEC: 75.0000 mg | DELAYED_RELEASE_TABLET | Freq: Two times a day (BID) | ORAL | 0 refills | Status: DC

## 2017-12-02 NOTE — ED Triage Notes (Signed)
Pt here with right knee pain after injuring a week ago

## 2017-12-02 NOTE — ED Provider Notes (Signed)
Johnathan Ross   [id number] 12/02/17 Arrival Time: 2423  ASSESSMENT & PLAN:  1. Acute pain of right knee     Imaging: Dg Knee Complete 4 Views Right  Result Date: 12/02/2017 CLINICAL DATA:  Right knee pain since a twisting injury 2 weeks ago. EXAM: RIGHT KNEE - COMPLETE 4+ VIEW COMPARISON:  Radiographs dated 10/29/2016 FINDINGS: There is no fracture or dislocation. Small marginal osteophytes in the medial compartment. No joint effusion. Old healed fracture of the proximal right fibula. IMPRESSION: No acute abnormality. Minimal degenerative changes in the medial compartment. Electronically Signed   By: Lorriane Shire M.D.   On: 12/02/2017 11:00   No new fractures seen.  Meds ordered this encounter  Medications  . diclofenac (VOLTAREN) 75 MG EC tablet    Sig: Take 1 tablet (75 mg total) by mouth 2 (two) times daily.    Dispense:  14 tablet    Refill:  0   Return to see PCP or orthopaedist if not improving over the next week. WBAT.  Reviewed expectations re: course of current medical issues. Questions answered. Outlined signs and symptoms indicating need for more acute intervention. Patient verbalized understanding. After Visit Summary given.  SUBJECTIVE: History from: patient. Currently in physical therapy for R ankle injury.  Johnathan Ross is a 47 y.o. male who reports fairly persistent mild to moderate pain of his right anterior knee that is stable; described as aching without radiation. Onset: gradual, over the past two weeks. Injury/trama: reports having to crawl over rocks on his knees when his lawnmower ran out of gas; did not have his cane with him (uses secondary to R anklefibula fractures; did not want to bear weight on ankle. Relieved by: rest. Worsened by: certain movements and positions Associated symptoms: none reported. Extremity sensation changes or weakness: none. Self treatment: has not tried OTCs for relief of pain.   ROS: As per  HPI.   OBJECTIVE:  Vitals:   12/02/17 1011  BP: 136/75  Pulse: 72  Resp: 16  Temp: 98.1 F (36.7 C)  TempSrc: Oral  SpO2: 100%    General appearance: alert; no distress Extremities: warm and well perfused; symmetrical with no gross deformities; localized tenderness over his right anterior knee where healed superficial wounds were with no swelling and no bruising; ROM: normal; describes anterior discomfort/ache when knee is fully flexed; no signs of infection CV: normal extremity capillary refill Skin: warm and dry Neurologic: normal gait; normal symmetric reflexes in all extremities; normal sensation in all extremities Psychological: alert and cooperative; normal mood and affect  No Known Allergies  Past Medical History:  Diagnosis Date  . Hyperlipidemia   . Hypertension   . Hypogonadism in male 11/01/2016  . Hypothyroidism   . Multiple sclerosis ([place])   . 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 Type 1 diabetes mellitus ([place]) dx'd 1994  . Vitamin D insufficiency 11/29/2016   Social History   Socioeconomic History  . Marital status: Single    Spouse name: Not on file  . Number of children: Not on file  . Years of education: Not on file  . Highest education level: Not on file  Occupational History  . Not on file  Social Needs  . Financial resource strain: Not on file  . Food insecurity:    Worry: Not on file    Inability: Not on file  . Transportation needs:    Medical: Not on file    Non-medical: Not on file  Tobacco Use  .  Smoking status: Never Smoker  . Smokeless tobacco: Never Used  Substance and Sexual Activity  . Alcohol use: No  . Drug use: No  . Sexual activity: Not on file  Lifestyle  . Physical activity:    Days per week: Not on file    Minutes per session: Not on file  . Stress: Not on file  Relationships  . Social connections:    Talks on phone: Not on file    Gets together: Not on file    Attends religious service: Not on file     Active member of club or organization: Not on file    Attends meetings of clubs or organizations: Not on file    Relationship status: Not on file  . Intimate partner violence:    Fear of current or ex partner: Not on file    Emotionally abused: Not on file    Physically abused: Not on file    Forced sexual activity: Not on file  Other Topics Concern  . Not on file  Social History Narrative   No caffeine    Family History  Problem Relation Age of Onset  . Diabetes Sister   . Colon cancer Maternal Grandmother   . Colon polyps Maternal Uncle    Past Surgical History:  Procedure Laterality Date  . EYE SURGERY Bilateral    "laser for diabetic retinopathy"  . FRACTURE SURGERY    . OPEN REDUCTION INTERNAL FIXATION (ORIF) TIBIA/FIBULA FRACTURE Right 10/30/2016  . ORIF ANKLE FRACTURE Left 2015  . ORIF TIBIA FRACTURE Right 10/30/2016   Procedure: OPEN REDUCTION INTERNAL FIXATION (ORIF) TIBIA FIBULA FRACTURE;  Surgeon: Altamese [place], MD;  Location: [facility];  Service: Orthopedics;  Laterality: Right;  . RETINAL LASER PROCEDURE Bilateral    "for diabetic retinopathy"      Vanessa Kick, MD 12/02/17 [phone]

## 2017-12-03 ENCOUNTER — Ambulatory Visit: Payer: Medicare Other | Admitting: Physical Therapy

## 2017-12-03 ENCOUNTER — Encounter: Payer: Self-pay | Admitting: Physical Therapy

## 2017-12-03 DIAGNOSIS — R2681 Unsteadiness on feet: Secondary | ICD-10-CM | POA: Diagnosis not present

## 2017-12-03 DIAGNOSIS — R262 Difficulty in walking, not elsewhere classified: Secondary | ICD-10-CM | POA: Diagnosis not present

## 2017-12-03 DIAGNOSIS — M6281 Muscle weakness (generalized): Secondary | ICD-10-CM | POA: Diagnosis not present

## 2017-12-03 DIAGNOSIS — M21371 Foot drop, right foot: Secondary | ICD-10-CM | POA: Diagnosis not present

## 2017-12-03 DIAGNOSIS — M21372 Foot drop, left foot: Secondary | ICD-10-CM | POA: Diagnosis not present

## 2017-12-03 NOTE — Therapy (Signed)
[facility] [address] [place], [place], [address] Phone: [phone]   Fax:  [phone]  Physical Therapy Treatment  Patient Details  Name: Johnathan Ross MRN: [id number] Date of Birth: 1970/10/13 Referring Provider: Altamese [place], MD   Encounter Date: 12/03/2017  PT End of Session - 12/03/17 0937    Visit Number  4    Number of Visits  88 # updated; pt will continue 4 more weeks    Date for PT Re-Evaluation  12/19/17    Authorization Type  Medicare - send every 10th visit note to referring physician for PN    PT Start Time  0850 pt arrived late    PT Stop Time  0930    PT Time Calculation (min)  40 min    Equipment Utilized During Treatment  Gait belt    Activity Tolerance  Patient tolerated treatment well    Behavior During Therapy  [facility] for tasks assessed/performed       Past Medical History:  Diagnosis Date  . Hyperlipidemia   . Hypertension   . Hypogonadism in male 11/01/2016  . Hypothyroidism   . Multiple sclerosis ([place])   . 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 Type 1 diabetes mellitus ([place]) dx'd 1994  . Vitamin D insufficiency 11/29/2016    Past Surgical History:  Procedure Laterality Date  . EYE SURGERY Bilateral    "laser for diabetic retinopathy"  . FRACTURE SURGERY    . OPEN REDUCTION INTERNAL FIXATION (ORIF) TIBIA/FIBULA FRACTURE Right 10/30/2016  . ORIF ANKLE FRACTURE Left 2015  . ORIF TIBIA FRACTURE Right 10/30/2016   Procedure: OPEN REDUCTION INTERNAL FIXATION (ORIF) TIBIA FIBULA FRACTURE;  Surgeon: Altamese [place], MD;  Location: [facility];  Service: Orthopedics;  Laterality: Right;  . RETINAL LASER PROCEDURE Bilateral    "for diabetic retinopathy"    There were no vitals filed for this visit.  Subjective Assessment - 12/03/17 0853    Subjective  pt reports no anterior knee pain today. He did go to ED 12/02/17 because of R knee pain not subsideing over weekend. He did report he  used an ice pack 2 times this weekend said it helped some.    Pertinent History  MS dx about 6 years ago    How long can you sit comfortably?  unlimited    How long can you stand comfortably?  unlimited    How long can you walk comfortably?  household distances due to LE fatigue/weakness    Diagnostic tests  X-ray    Patient Stated Goals  Walk without AD;  On 06/17/17 patient states "for me, coming off of that (RW) is how to measure improvement".     Pain Score  0-No pain    Pain Onset  1 to 4 weeks ago                       [facility] Adult PT Treatment/Exercise - 12/03/17 0001      Ambulation/Gait   Ambulation/Gait  Yes    Ambulation/Gait Assistance  4: Min assist;3: Mod assist    Ambulation/Gait Assistance Details  Pt ambulated with tripod cane into therapy gym. He was unsteady on feet, stopping every few feet for standing rest break. Needing min-mod assist to prevent LOB due to improper cane placement and wide bos.     Ambulation Distance (Feet)  85 Feet into and out of clinic    Assistive device  -- tripod cane  Gait Pattern  Step-through pattern;Poor foot clearance - right;Lateral hip instability;Wide base of support    Ambulation Surface  Indoor;Level      Neuro Re-ed    Neuro Re-ed Details   on airex pad In corner with chair for safety. feet shoulder width apart EC, progressing to head turns/nods x 30 secs each. Pt needing min assist and  intermittent UE support to maintain balance with head turns.      Knee/Hip Exercises: Supine   Quad Sets  AROM;Strengthening;Both;10 reps    Heel Slides  Both;Strengthening;AROM;10 reps;2 sets    Heel Slides Limitations  vc required to avoid hip flexion    Bridges  AROM;Strengthening;Both;10 reps;2 sets vc's to contract glutes and core during ex    Straight Leg Raises  AROM;Strengthening;1 set;10 reps    Straight Leg Raises Limitations  vc to keep leg straigth      Knee/Hip Exercises: Sidelying   Hip ABduction   AROM;AAROM;Strengthening;Both;10 reps;Limitations needed min assit to bring LEs up against gravity.    Clams  10 reps with cues for slow, controlled movements               PT Short Term Goals - 10/17/17 1502      PT SHORT TERM GOAL #1   Title  = LTG (cert written for 60 days but anticipate D/C at 4 weeks)        PT Long Term Goals - 11/21/17 1705      PT LONG TERM GOAL #1   Title  Will be independent with HEP with standing HEP and [employer] HEP if approved    Baseline  performing current HEP, still awaiting [employer] decision    Time  4    Period  Weeks    Status  On-going    Target Date  12/19/17      PT LONG TERM GOAL #2   Title  Pt will ambulate 150' outside on pavement and negotiate curb with supervision with AFO and cane    Baseline  performed with min A    Time  4    Period  Weeks    Status  On-going    Target Date  12/19/17      PT LONG TERM GOAL #3   Title  Pt will demonstrate independence with [employer] home unit (donning/doffing, wear schedule, skin care, care of components)    Baseline  Awaiting approval    Time  4    Period  Weeks    Status  On-going    Target Date  12/19/17      PT LONG TERM GOAL #5   Title  Pt will decrease falls risk as indicated by increase in BERG score to > or = 50/56    Baseline  49/56, progressing    Time  4    Period  Weeks    Status  On-going    Target Date  12/19/17      PT LONG TERM GOAL #7   Title  Pt will improve gait velocity to > or = 2.8 ft/sec with LRAD and appropriate AFO     Baseline  2.48 ft/sec with cane and AFO, progressing    Time  4    Period  Weeks    Status  On-going    Target Date  12/19/17            Plan - 12/03/17 6283    Clinical Impression Statement  Today's skilled session focused on gentle Le strengthening  and balance to not exacerbate R knee pain. Pt is still unsteady with gt with cane and needs frequent rest breaks. SPTA adjusted / corrected AFO in pt's shoe.  Pt would benefit from  contiuned Pt in order to progress toward unmet goals.    Rehab Potential  Good    Clinical Impairments Affecting Rehab Potential  MS     PT Frequency  2x / week    PT Duration  4 weeks    PT Treatment/Interventions  ADLs/Self Care Home Management;Cryotherapy;Vasopneumatic Device;Taping;Gait training;Stair training;Functional mobility training;Therapeutic activities;Therapeutic exercise;Balance training;Neuromuscular re-education;Passive range of motion;Manual techniques;Patient/family education;Electrical Stimulation;DME Instruction;Orthotic Fit/Training;Energy conservation    PT Next Visit Plan  gentle strengthening if knee pain persists.   Remind him to bring in pamphlet with other agencies for [employer] financial assistance. still awaiting decision about [employer] home unit.  Balance in corner: feet together, tandem, turns.  Core training on rockerboard weight shift to R at ribs + SLS; Keep adding to MedBridge HEP; Sit <> stand single leg from elevated seat/stool/mat using BIoness on R hamstring. ABD pushing BOSU.  Side planks, quadruped core/lateral hip strengthening.  gait with new AFO, floor ladder. Continue Stepping strategy training using sports cord for perturbations-vary surfaces; during gait if you have +2.  Continue proximal hip/core strengthening with Bioness - tandem stand, side step with squat, tall kneeling and half kneel, closed chain hip ABD (tap downs laterally).  Gait with Bioness; treadmill    Consulted and Agree with Plan of Care  Patient       Patient will benefit from skilled therapeutic intervention in order to improve the following deficits and impairments:  Abnormal gait, Pain, Decreased range of motion, Decreased balance, Decreased mobility, Difficulty walking, Decreased activity tolerance, Decreased strength, Decreased endurance, Impaired flexibility, Impaired sensation  Visit Diagnosis: Muscle weakness (generalized)  Unsteadiness on feet  Difficulty in walking, not  elsewhere classified     Problem List Patient Active Problem List   Diagnosis Date Noted  . Vitamin D insufficiency 11/29/2016  . Hypogonadism in male 11/01/2016  . Type 1 diabetes mellitus ([place])   . Hypothyroidism   . Hypertension   . Hyperlipidemia   . Closed fracture of right fibula and tibia 10/29/2016  . Closed tibia fracture 10/29/2016  . Closed fracture of right tibial plafond with fibula involvement 10/29/2016  . Abnormal liver function tests 03/06/2013  . RASH AND OTHER NONSPECIFIC SKIN ERUPTION 10/07/2009  . SHOULDER PAIN, LEFT 12/27/2008  . Type I (juvenile type) diabetes mellitus without mention of complication, uncontrolled 12/03/2007  . HYPOTHYROIDISM 11/12/2007  . HYPERCHOLESTEROLEMIA 11/12/2007  . MULTIPLE SCLEROSIS 08/29/2007  . PROLIFERATIVE DIABETIC RETINOPATHY 08/29/2007  . HYPERTENSION 12/25/2006   Halina Andreas, [place] 12/03/2017, 10:10 AM  [facility] [address] [place], [place], [address] Phone: [phone]   Fax:  [phone]  Name: Johnathan Ross MRN: [id number] Date of Birth: [birth year]

## 2017-12-05 ENCOUNTER — Encounter: Payer: Self-pay | Admitting: Physical Therapy

## 2017-12-05 ENCOUNTER — Ambulatory Visit: Payer: Medicare Other | Admitting: Physical Therapy

## 2017-12-05 DIAGNOSIS — M21371 Foot drop, right foot: Secondary | ICD-10-CM

## 2017-12-05 DIAGNOSIS — R2681 Unsteadiness on feet: Secondary | ICD-10-CM

## 2017-12-05 DIAGNOSIS — M21372 Foot drop, left foot: Secondary | ICD-10-CM | POA: Diagnosis not present

## 2017-12-05 DIAGNOSIS — R262 Difficulty in walking, not elsewhere classified: Secondary | ICD-10-CM

## 2017-12-05 DIAGNOSIS — M6281 Muscle weakness (generalized): Secondary | ICD-10-CM | POA: Diagnosis not present

## 2017-12-05 NOTE — Therapy (Signed)
[facility] [address] [place], [place], [address] Phone: [phone]   Fax:  [phone]  Physical Therapy Treatment  Patient Details  Name: Johnathan Ross MRN: [id number] Date of Birth: 09-03-1970 Referring Provider: Altamese [place], MD   Encounter Date: 12/05/2017  PT End of Session - 12/05/17 1248    Visit Number  18    Number of Visits  88 # updated; pt will continue 4 more weeks    Date for PT Re-Evaluation  12/19/17    Authorization Type  Medicare - send every 10th visit note to referring physician for PN    PT Start Time  0848    PT Stop Time  0935    PT Time Calculation (min)  47 min    Activity Tolerance  Patient tolerated treatment well    Behavior During Therapy  [facility] for tasks assessed/performed       Past Medical History:  Diagnosis Date  . Hyperlipidemia   . Hypertension   . Hypogonadism in male 11/01/2016  . Hypothyroidism   . Multiple sclerosis ([place])   . 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 Type 1 diabetes mellitus ([place]) dx'd 1994  . Vitamin D insufficiency 11/29/2016    Past Surgical History:  Procedure Laterality Date  . EYE SURGERY Bilateral    "laser for diabetic retinopathy"  . FRACTURE SURGERY    . OPEN REDUCTION INTERNAL FIXATION (ORIF) TIBIA/FIBULA FRACTURE Right 10/30/2016  . ORIF ANKLE FRACTURE Left 2015  . ORIF TIBIA FRACTURE Right 10/30/2016   Procedure: OPEN REDUCTION INTERNAL FIXATION (ORIF) TIBIA FIBULA FRACTURE;  Surgeon: Altamese [place], MD;  Location: [facility];  Service: Orthopedics;  Laterality: Right;  . RETINAL LASER PROCEDURE Bilateral    "for diabetic retinopathy"    There were no vitals filed for this visit.  Subjective Assessment - 12/05/17 0854    Subjective  No pain today; gait improved today.  Still has some swelling in R knee and having a hard time sleeping on his stomach due to edema and discomfort.  Has avoided being on the knee during exercises.     Pertinent History  MS dx about 6 years ago    How long can you sit comfortably?  unlimited    How long can you stand comfortably?  unlimited    How long can you walk comfortably?  household distances due to LE fatigue/weakness    Diagnostic tests  X-ray    Patient Stated Goals  Walk without AD;  On 06/17/17 patient states "for me, coming off of that (RW) is how to measure improvement".     Currently in Pain?  No/denies    Pain Onset  1 to 4 weeks ago                       [facility] Adult PT Treatment/Exercise - 12/05/17 0908      Lumbar Exercises: Supine   Bridge with Diona Foley Squeeze  Non-compliant;10 reps squeeze yoga block    Bridge with clamshell  Non-compliant;10 reps against resistance of blue theraband    Bridge with March  Non-compliant;10 reps against resistance of blue theraband      Lumbar Exercises: Sidelying   Clam  Right;Left;10 reps regular clam and reverse clam for IR          Balance Exercises - 12/05/17 0909      Balance Exercises: Standing   Standing Eyes Opened  Wide (BOA);Head turns;Solid surface;Other reps (comment) 10 reps  PNF patterns L and R with 2kg ball    Tandem Stance  Eyes open;Other reps (comment) 10 reps PNF diagonals with 2kg ball, each side    Wall Bumps  Hip    Wall Bumps-Hips  Anterior/posterior;Eyes opened;10 reps one foot on balance disc for partial SLS    Turning  Right;Both;5 reps no UE support        PT Education - 12/05/17 1250    Education provided  Yes    Education Details  ice knee at night prior to going to sleep to improve comfort when sleeping prone    Person(s) Educated  Patient    Methods  Explanation    Comprehension  Verbalized understanding       PT Short Term Goals - 10/17/17 1502      PT SHORT TERM GOAL #1   Title  = LTG (cert written for 60 days but anticipate D/C at 4 weeks)        PT Long Term Goals - 11/21/17 1705      PT LONG TERM GOAL #1   Title  Will be independent with HEP with standing HEP  and [employer] HEP if approved    Baseline  performing current HEP, still awaiting [employer] decision    Time  4    Period  Weeks    Status  On-going    Target Date  12/19/17      PT LONG TERM GOAL #2   Title  Pt will ambulate 150' outside on pavement and negotiate curb with supervision with AFO and cane    Baseline  performed with min A    Time  4    Period  Weeks    Status  On-going    Target Date  12/19/17      PT LONG TERM GOAL #3   Title  Pt will demonstrate independence with Bioness home unit (donning/doffing, wear schedule, skin care, care of components)    Baseline  Awaiting approval    Time  4    Period  Weeks    Status  On-going    Target Date  12/19/17      PT LONG TERM GOAL #5   Title  Pt will decrease falls risk as indicated by increase in BERG score to > or = 50/56    Baseline  49/56, progressing    Time  4    Period  Weeks    Status  On-going    Target Date  12/19/17      PT LONG TERM GOAL #7   Title  Pt will improve gait velocity to > or = 2.8 ft/sec with LRAD and appropriate AFO     Baseline  2.48 ft/sec with cane and AFO, progressing    Time  4    Period  Weeks    Status  On-going    Target Date  12/19/17            Plan - 12/05/17 1249    Clinical Impression Statement  Due to ongoing discomfort in R knee, continued to focus on proximal hip and core strengthening in supine and in standing with increased focus on rotational stability.  Progressed turning to L and R in standing to without UE support with min A for weight shifting and stability.  Added hip wall bumps with partial SLS on compliant surface to continue to focus on balance reactions posterior > anterior.  Pt demonstrated improved gait today and was able to ambulate partial  distance at supervision.  Pt did experience 3 LOB requiring external assistance to prevent fall.  Will continue to address in order to return to functional level prior to knee injury and to continue to progress towards LTG.     Rehab Potential  Good    Clinical Impairments Affecting Rehab Potential  MS     PT Frequency  2x / week    PT Duration  4 weeks    PT Treatment/Interventions  ADLs/Self Care Home Management;Cryotherapy;Vasopneumatic Device;Taping;Gait training;Stair training;Functional mobility training;Therapeutic activities;Therapeutic exercise;Balance training;Neuromuscular re-education;Passive range of motion;Manual techniques;Patient/family education;Electrical Stimulation;DME Instruction;Orthotic Fit/Training;Energy conservation    PT Next Visit Plan  Remind him to bring in pamphlet with other agencies for [employer] financial assistance. still awaiting decision about [employer] home unit.  Balance in corner: feet together, tandem, turns.  Core training on rockerboard weight shift to R at ribs + SLS; Keep adding to MedBridge HEP; Sit <> stand single leg from elevated seat/stool/mat using BIoness on R hamstring. ABD pushing BOSU.  Side planks, quadruped core/lateral hip strengthening.  gait with new AFO, floor ladder. Continue Stepping strategy training using sports cord for perturbations-vary surfaces; during gait if you have +2.  Continue proximal hip/core strengthening with Bioness - tandem stand, side step with squat, tall kneeling and half kneel, closed chain hip ABD (tap downs laterally).  Gait with Bioness; treadmill    Consulted and Agree with Plan of Care  Patient       Patient will benefit from skilled therapeutic intervention in order to improve the following deficits and impairments:  Abnormal gait, Pain, Decreased range of motion, Decreased balance, Decreased mobility, Difficulty walking, Decreased activity tolerance, Decreased strength, Decreased endurance, Impaired flexibility, Impaired sensation  Visit Diagnosis: Muscle weakness (generalized)  Unsteadiness on feet  Difficulty in walking, not elsewhere classified  Foot drop, right  Foot drop, left     Problem List Patient Active Problem List    Diagnosis Date Noted  . Vitamin D insufficiency 11/29/2016  . Hypogonadism in male 11/01/2016  . Type 1 diabetes mellitus ([place])   . Hypothyroidism   . Hypertension   . Hyperlipidemia   . Closed fracture of right fibula and tibia 10/29/2016  . Closed tibia fracture 10/29/2016  . Closed fracture of right tibial plafond with fibula involvement 10/29/2016  . Abnormal liver function tests 03/06/2013  . RASH AND OTHER NONSPECIFIC SKIN ERUPTION 10/07/2009  . SHOULDER PAIN, LEFT 12/27/2008  . Type I (juvenile type) diabetes mellitus without mention of complication, uncontrolled 12/03/2007  . HYPOTHYROIDISM 11/12/2007  . HYPERCHOLESTEROLEMIA 11/12/2007  . MULTIPLE SCLEROSIS 08/29/2007  . PROLIFERATIVE DIABETIC RETINOPATHY 08/29/2007  . HYPERTENSION 12/25/2006   [name], PT, DPT 12/05/17    12:57 PM    [facility] [address] [place], [place], [address] Phone: [phone]   Fax:  [phone]  Name: Johnathan Ross MRN: [id number] Date of Birth: November 13, 1970

## 2017-12-09 ENCOUNTER — Ambulatory Visit: Payer: Medicare Other | Admitting: Physical Therapy

## 2017-12-09 ENCOUNTER — Encounter: Payer: Self-pay | Admitting: Physical Therapy

## 2017-12-09 DIAGNOSIS — M6281 Muscle weakness (generalized): Secondary | ICD-10-CM | POA: Diagnosis not present

## 2017-12-09 DIAGNOSIS — R2681 Unsteadiness on feet: Secondary | ICD-10-CM | POA: Diagnosis not present

## 2017-12-09 DIAGNOSIS — R262 Difficulty in walking, not elsewhere classified: Secondary | ICD-10-CM

## 2017-12-09 DIAGNOSIS — M21371 Foot drop, right foot: Secondary | ICD-10-CM | POA: Diagnosis not present

## 2017-12-09 DIAGNOSIS — M21372 Foot drop, left foot: Secondary | ICD-10-CM

## 2017-12-09 NOTE — Therapy (Signed)
[facility] [address] [place], [place], [address] Phone: [phone]   Fax:  [phone]  Physical Therapy Treatment  Patient Details  Name: Johnathan Ross MRN: [id number] Date of Birth: [birth year] Referring Provider: Altamese [place], MD   Encounter Date: 12/09/2017  PT End of Session - 12/09/17 0949    Visit Number  68    Number of Visits  88 # updated; pt will continue 4 more weeks    Date for PT Re-Evaluation  12/19/17    Authorization Type  Medicare - send every 10th visit note to referring physician for PN    PT Start Time  0846    PT Stop Time  0930    PT Time Calculation (min)  44 min    Equipment Utilized During Treatment  Gait belt    Activity Tolerance  Patient tolerated treatment well    Behavior During Therapy  [facility] for tasks assessed/performed       Past Medical History:  Diagnosis Date  . Hyperlipidemia   . Hypertension   . Hypogonadism in male 11/01/2016  . Hypothyroidism   . Multiple sclerosis ([place])   . 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 Type 1 diabetes mellitus ([place]) dx'd 1994  . Vitamin D insufficiency 11/29/2016    Past Surgical History:  Procedure Laterality Date  . EYE SURGERY Bilateral    "laser for diabetic retinopathy"  . FRACTURE SURGERY    . OPEN REDUCTION INTERNAL FIXATION (ORIF) TIBIA/FIBULA FRACTURE Right 10/30/2016  . ORIF ANKLE FRACTURE Left 2015  . ORIF TIBIA FRACTURE Right 10/30/2016   Procedure: OPEN REDUCTION INTERNAL FIXATION (ORIF) TIBIA FIBULA FRACTURE;  Surgeon: Altamese [place], MD;  Location: [facility];  Service: Orthopedics;  Laterality: Right;  . RETINAL LASER PROCEDURE Bilateral    "for diabetic retinopathy"    There were no vitals filed for this visit.  Subjective Assessment - 12/09/17 0837    Subjective  No pain or falls to report. Still avoiding getting on knee due to tenderness.     Pertinent History  MS dx about 6 years ago    How long can you sit  comfortably?  unlimited    How long can you stand comfortably?  unlimited    How long can you walk comfortably?  household distances due to LE fatigue/weakness    Diagnostic tests  X-ray    Patient Stated Goals  Walk without AD;  On 06/17/17 patient states "for me, coming off of that (RW) is how to measure improvement".     Currently in Pain?  No/denies    Pain Onset  1 to 4 weeks ago          [facility] Adult PT Treatment/Exercise - 12/09/17 6812      Neuro Re-ed    Neuro Re-ed Details   in corner with chair for safety  level surface feet together, EC progressing to semi tandem then to tandem stance with EC. Pt requiring min assist to maintain/recover balance with tandem and occasional UE support . 30 secs x 3 sets each. Pt intsructed on Rocker board to work on balance and ankle strategy , ant<>post and side<>side EO progresing to EC. Min assist and intermittent UE support, LOBx1 with side<>side EC.       Lumbar Exercises: Supine   Bridge with Ball Squeeze  10 reps;3 seconds;Non-compliant squeezing yoga block    Bridge with clamshell  Non-compliant;10 reps blue tband, vc on correct technique      Lumbar  Exercises: Sidelying   Clam  10 reps;Both;Limitations needing min assist to lift le      Knee/Hip Exercises: Standing   Lateral Step Up  1 set;10 reps;Both;Step Height: 4" required standing rest breaks between reps    Other Standing Knee Exercises  side stepping at counter top for safety x 3 sets, pt needing min guard for balance and intermittent UE support.Pt stopping at times to rest during ex due to muscle fatigue.               PT Short Term Goals - 10/17/17 1502      PT SHORT TERM GOAL #1   Title  = LTG (cert written for 60 days but anticipate D/C at 4 weeks)        PT Long Term Goals - 11/21/17 1705      PT LONG TERM GOAL #1   Title  Will be independent with HEP with standing HEP and [employer] HEP if approved    Baseline  performing current HEP, still awaiting [employer]  decision    Time  4    Period  Weeks    Status  On-going    Target Date  12/19/17      PT LONG TERM GOAL #2   Title  Pt will ambulate 150' outside on pavement and negotiate curb with supervision with AFO and cane    Baseline  performed with min A    Time  4    Period  Weeks    Status  On-going    Target Date  12/19/17      PT LONG TERM GOAL #3   Title  Pt will demonstrate independence with [employer] home unit (donning/doffing, wear schedule, skin care, care of components)    Baseline  Awaiting approval    Time  4    Period  Weeks    Status  On-going    Target Date  12/19/17      PT LONG TERM GOAL #5   Title  Pt will decrease falls risk as indicated by increase in BERG score to > or = 50/56    Baseline  49/56, progressing    Time  4    Period  Weeks    Status  On-going    Target Date  12/19/17      PT LONG TERM GOAL #7   Title  Pt will improve gait velocity to > or = 2.8 ft/sec with LRAD and appropriate AFO     Baseline  2.48 ft/sec with cane and AFO, progressing    Time  4    Period  Weeks    Status  On-going    Target Date  12/19/17            Plan - 12/09/17 0950    Clinical Impression Statement  Today's skilled session focused on lateral and proximal hip strengthening and balance with no exercise on knees due to tenderness. Pt did experience LOB X 1 requiring external assistance to prevent fall. Pt would benefit from contiuned PT in order to progress toward goals and improve functional independence .    Rehab Potential  Good    Clinical Impairments Affecting Rehab Potential  MS     PT Frequency  2x / week    PT Duration  4 weeks    PT Treatment/Interventions  ADLs/Self Care Home Management;Cryotherapy;Vasopneumatic Device;Taping;Gait training;Stair training;Functional mobility training;Therapeutic activities;Therapeutic exercise;Balance training;Neuromuscular re-education;Passive range of motion;Manual techniques;Patient/family education;Electrical Stimulation;DME  Instruction;Orthotic Fit/Training;Energy conservation  PT Next Visit Plan  Remind him to bring in pamphlet with other agencies for [employer] financial assistance. still awaiting decision about [employer] home unit.  Balance in corner: tandem, turns.  Core training on rockerboard weight shift to R at ribs + SLS; Keep adding to [employer] HEP; Sit <> stand single leg from elevated seat/stool/mat using [employer] on R hamstring. ABD pushing BOSU.  Side planks, quadruped core/lateral hip strengthening.  gait with new AFO, floor ladder. Continue Stepping strategy training using sports cord for perturbations-vary surfaces; during gait if you have +2.  Continue proximal hip/core strengthening with Bion[employer] - tandem stand, side step with squat, tall kneeling and half kneel, closed chain hip ABD (tap downs laterally).  Gait with Bioness; treadmill    PT Home Exercise Plan  Pt has program at home for strengthening and balance    Consulted and Agree with Plan of Care  Patient       Patient will benefit from skilled therapeutic intervention in order to improve the following deficits and impairments:  Abnormal gait, Pain, Decreased range of motion, Decreased balance, Decreased mobility, Difficulty walking, Decreased activity tolerance, Decreased strength, Decreased endurance, Impaired flexibility, Impaired sensation  Visit Diagnosis: Muscle weakness (generalized)  Unsteadiness on feet  Difficulty in walking, not elsewhere classified  Foot drop, right  Foot drop, left     Problem List Patient Active Problem List   Diagnosis Date Noted  . Vitamin D insufficiency 11/29/2016  . Hypogonadism in male 11/01/2016  . Type 1 diabetes mellitus ([place])   . Hypothyroidism   . Hypertension   . Hyperlipidemia   . Closed fracture of right fibula and tibia 10/29/2016  . Closed tibia fracture 10/29/2016  . Closed fracture of right tibial plafond with fibula involvement 10/29/2016  . Abnormal liver function tests  03/06/2013  . RASH AND OTHER NONSPECIFIC SKIN ERUPTION 10/07/2009  . SHOULDER PAIN, LEFT 12/27/2008  . Type I (juvenile type) diabetes mellitus without mention of complication, uncontrolled 12/03/2007  . HYPOTHYROIDISM 11/12/2007  . HYPERCHOLESTEROLEMIA 11/12/2007  . MULTIPLE SCLEROSIS 08/29/2007  . PROLIFERATIVE DIABETIC RETINOPATHY 08/29/2007  . HYPERTENSION 12/25/2006   Halina Andreas, [place] 12/09/2017, 9:57 AM  [facility] [address] [place], [place], [address] Phone: [phone]   Fax:  [phone]  Name: Johnathan Ross MRN: [id number] Date of Birth: 1970/07/26

## 2017-12-10 DIAGNOSIS — H2513 Age-related nuclear cataract, bilateral: Secondary | ICD-10-CM | POA: Diagnosis not present

## 2017-12-10 DIAGNOSIS — H35351 Cystoid macular degeneration, right eye: Secondary | ICD-10-CM | POA: Diagnosis not present

## 2017-12-10 DIAGNOSIS — E103553 Type 1 diabetes mellitus with stable proliferative diabetic retinopathy, bilateral: Secondary | ICD-10-CM | POA: Diagnosis not present

## 2017-12-10 DIAGNOSIS — H3562 Retinal hemorrhage, left eye: Secondary | ICD-10-CM | POA: Diagnosis not present

## 2017-12-13 ENCOUNTER — Ambulatory Visit: Payer: Medicare Other | Admitting: Physical Therapy

## 2017-12-13 ENCOUNTER — Encounter: Payer: Self-pay | Admitting: Physical Therapy

## 2017-12-13 DIAGNOSIS — M21372 Foot drop, left foot: Secondary | ICD-10-CM | POA: Diagnosis not present

## 2017-12-13 DIAGNOSIS — R262 Difficulty in walking, not elsewhere classified: Secondary | ICD-10-CM | POA: Diagnosis not present

## 2017-12-13 DIAGNOSIS — M6281 Muscle weakness (generalized): Secondary | ICD-10-CM

## 2017-12-13 DIAGNOSIS — R2681 Unsteadiness on feet: Secondary | ICD-10-CM

## 2017-12-13 DIAGNOSIS — M21371 Foot drop, right foot: Secondary | ICD-10-CM

## 2017-12-13 NOTE — Therapy (Signed)
[facility] [address] [place], [place], [address] Phone: [phone]   Fax:  [phone]  Physical Therapy Treatment  Patient Details  Name: Johnathan Ross MRN: [id number] Date of Birth: 17-Apr-1971 Referring Provider: Altamese [place], MD   Encounter Date: 12/13/2017  PT End of Session - 12/13/17 1013    Visit Number  25    Number of Visits  88 # updated; pt will continue 4 more weeks    Date for PT Re-Evaluation  12/19/17    Authorization Type  Medicare - send every 10th visit note to referring physician for PN    PT Start Time  0848    PT Stop Time  0933    PT Time Calculation (min)  45 min    Equipment Utilized During Treatment  Gait belt    Activity Tolerance  Patient tolerated treatment well    Behavior During Therapy  [facility] for tasks assessed/performed       Past Medical History:  Diagnosis Date  . Hyperlipidemia   . Hypertension   . Hypogonadism in male 11/01/2016  . Hypothyroidism   . Multiple sclerosis ([place])   . 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 Type 1 diabetes mellitus ([place]) dx'd 1994  . Vitamin D insufficiency 11/29/2016    Past Surgical History:  Procedure Laterality Date  . EYE SURGERY Bilateral    "laser for diabetic retinopathy"  . FRACTURE SURGERY    . OPEN REDUCTION INTERNAL FIXATION (ORIF) TIBIA/FIBULA FRACTURE Right 10/30/2016  . ORIF ANKLE FRACTURE Left 2015  . ORIF TIBIA FRACTURE Right 10/30/2016   Procedure: OPEN REDUCTION INTERNAL FIXATION (ORIF) TIBIA FIBULA FRACTURE;  Surgeon: Altamese [place], MD;  Location: [facility];  Service: Orthopedics;  Laterality: Right;  . RETINAL LASER PROCEDURE Bilateral    "for diabetic retinopathy"    There were no vitals filed for this visit.  Subjective Assessment - 12/13/17 0853    Subjective  Came into therapy with RW today with c/o tiredness in his legs. No pain or falls to report. Knee is still tender. Reminded to bring [employer] paper work  next visit (alternate methods for funding). Reports he has been doing HEP at home    Pertinent History  MS dx about 6 years ago    How long can you sit comfortably?  unlimited    How long can you stand comfortably?  unlimited    How long can you walk comfortably?  household distances due to LE fatigue/weakness    Diagnostic tests  X-ray    Patient Stated Goals  Walk without AD;  On 06/17/17 patient states "for me, coming off of that (RW) is how to measure improvement".     Currently in Pain?  No/denies    Pain Onset  1 to 4 weeks ago            [facility] Adult PT Treatment/Exercise - 12/13/17 1021      Neuro Re-ed    Neuro Re-ed Details   in corner with chair in front for safety on level surface tandem stance EO then EC x 30 secs x 2 sets each LE. requiring min assit and intermittent UE support to maintain balance and prevent LOB.  Feet together with EO the EC with head turns/nods and reaches x 10 reps x 2 sets each Needing min guard and occasional UE support to prevent LOB.       Knee/Hip Exercises: Standing   Lateral Step Up  1 set;10 reps;Both;Step Height: 4";Limitations  Lateral Step Up Limitations  needing standing rest breaks between reps      Knee/Hip Exercises: Supine   Bridges with Ball Squeeze  AROM;Both;10 reps;1 set    Bridges with Clamshell  AROM;Both;1 set;10 reps bule tband; verbal cues for proper technique               PT Short Term Goals - 10/17/17 1502      PT SHORT TERM GOAL #1   Title  = LTG (cert written for 60 days but anticipate D/C at 4 weeks)        PT Long Term Goals - 11/21/17 1705      PT LONG TERM GOAL #1   Title  Will be independent with HEP with standing HEP and [employer] HEP if approved    Baseline  performing current HEP, still awaiting [employer] decision    Time  4    Period  Weeks    Status  On-going    Target Date  12/19/17      PT LONG TERM GOAL #2   Title  Pt will ambulate 150' outside on pavement and negotiate curb with  supervision with AFO and cane    Baseline  performed with min A    Time  4    Period  Weeks    Status  On-going    Target Date  12/19/17      PT LONG TERM GOAL #3   Title  Pt will demonstrate independence with [employer] home unit (donning/doffing, wear schedule, skin care, care of components)    Baseline  Awaiting approval    Time  4    Period  Weeks    Status  On-going    Target Date  12/19/17      PT LONG TERM GOAL #5   Title  Pt will decrease falls risk as indicated by increase in BERG score to > or = 50/56    Baseline  49/56, progressing    Time  4    Period  Weeks    Status  On-going    Target Date  12/19/17      PT LONG TERM GOAL #7   Title  Pt will improve gait velocity to > or = 2.8 ft/sec with LRAD and appropriate AFO     Baseline  2.48 ft/sec with cane and AFO, progressing    Time  4    Period  Weeks    Status  On-going    Target Date  12/19/17         Plan - 12/13/17 1014    Clinical Impression Statement  Patient was using RW instead of cane today due to feeling tired. Today's sessoin focused on lateral hip strength and balance, including rest breaks in between exercises. While trying to sit in chair pt experienced LOB X1 requiring exernal assist to prevent fall. Pt would benefit from continued PT to improve functional independence and meet goals.    Rehab Potential  Good    Clinical Impairments Affecting Rehab Potential  MS     PT Frequency  2x / week    PT Duration  4 weeks    PT Treatment/Interventions  ADLs/Self Care Home Management;Cryotherapy;Vasopneumatic Device;Taping;Gait training;Stair training;Functional mobility training;Therapeutic activities;Therapeutic exercise;Balance training;Neuromuscular re-education;Passive range of motion;Manual techniques;Patient/family education;Electrical Stimulation;DME Instruction;Orthotic Fit/Training;Energy conservation    PT Next Visit Plan  begin to check LTGs as plan ends next week     PT Home Exercise Plan  Pt has  program at home  for strengthening and balance    Consulted and Agree with Plan of Care  Patient          Patient will benefit from skilled therapeutic intervention in order to improve the following deficits and impairments:  Abnormal gait, Pain, Decreased range of motion, Decreased balance, Decreased mobility, Difficulty walking, Decreased activity tolerance, Decreased strength, Decreased endurance, Impaired flexibility, Impaired sensation  Visit Diagnosis: Muscle weakness (generalized)  Unsteadiness on feet  Difficulty in walking, not elsewhere classified  Foot drop, right  Foot drop, left     Problem List Patient Active Problem List   Diagnosis Date Noted  . Vitamin D insufficiency 11/29/2016  . Hypogonadism in male 11/01/2016  . Type 1 diabetes mellitus ([place])   . Hypothyroidism   . Hypertension   . Hyperlipidemia   . Closed fracture of right fibula and tibia 10/29/2016  . Closed tibia fracture 10/29/2016  . Closed fracture of right tibial plafond with fibula involvement 10/29/2016  . Abnormal liver function tests 03/06/2013  . RASH AND OTHER NONSPECIFIC SKIN ERUPTION 10/07/2009  . SHOULDER PAIN, LEFT 12/27/2008  . Type I (juvenile type) diabetes mellitus without mention of complication, uncontrolled 12/03/2007  . HYPOTHYROIDISM 11/12/2007  . HYPERCHOLESTEROLEMIA 11/12/2007  . MULTIPLE SCLEROSIS 08/29/2007  . PROLIFERATIVE DIABETIC RETINOPATHY 08/29/2007  . HYPERTENSION 12/25/2006   Halina Andreas, [place] 12/13/2017, 10:29 AM  [facility] [address] [place], [place], [address] Phone: [phone]   Fax:  [phone]  Name: Johnathan Ross MRN: [id number] Date of Birth: May 23, 1971

## 2017-12-16 ENCOUNTER — Encounter: Payer: Self-pay | Admitting: Physical Therapy

## 2017-12-16 ENCOUNTER — Other Ambulatory Visit: Payer: Self-pay | Admitting: Endocrinology

## 2017-12-16 ENCOUNTER — Ambulatory Visit: Payer: Medicare Other | Admitting: Physical Therapy

## 2017-12-16 DIAGNOSIS — R2681 Unsteadiness on feet: Secondary | ICD-10-CM

## 2017-12-16 DIAGNOSIS — R262 Difficulty in walking, not elsewhere classified: Secondary | ICD-10-CM | POA: Diagnosis not present

## 2017-12-16 DIAGNOSIS — M21372 Foot drop, left foot: Secondary | ICD-10-CM

## 2017-12-16 DIAGNOSIS — M21371 Foot drop, right foot: Secondary | ICD-10-CM

## 2017-12-16 DIAGNOSIS — M6281 Muscle weakness (generalized): Secondary | ICD-10-CM

## 2017-12-17 NOTE — Therapy (Signed)
[facility] [address] [place], [place], [address] Phone: [phone]   Fax:  [phone]  Physical Therapy Treatment  Patient Details  Name: Johnathan Ross MRN: [id number] Date of Birth: 01/25/1971 Referring Provider: Altamese [place], MD   Encounter Date: 12/16/2017  PT End of Session - 12/17/17 1140    Visit Number  50    Number of Visits  88 # updated; pt will continue 4 more weeks    Date for PT Re-Evaluation  12/19/17    Authorization Type  Medicare - send every 10th visit note to referring physician for PN    PT Start Time  0851    PT Stop Time  0940    PT Time Calculation (min)  49 min    Activity Tolerance  Patient limited by fatigue    Behavior During Therapy  [facility] for tasks assessed/performed       Past Medical History:  Diagnosis Date  . Hyperlipidemia   . Hypertension   . Hypogonadism in male 11/01/2016  . Hypothyroidism   . Multiple sclerosis ([place])   . 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 Type 1 diabetes mellitus ([place]) dx'd 1994  . Vitamin D insufficiency 11/29/2016    Past Surgical History:  Procedure Laterality Date  . EYE SURGERY Bilateral    "laser for diabetic retinopathy"  . FRACTURE SURGERY    . OPEN REDUCTION INTERNAL FIXATION (ORIF) TIBIA/FIBULA FRACTURE Right 10/30/2016  . ORIF ANKLE FRACTURE Left 2015  . ORIF TIBIA FRACTURE Right 10/30/2016   Procedure: OPEN REDUCTION INTERNAL FIXATION (ORIF) TIBIA FIBULA FRACTURE;  Surgeon: Altamese [place], MD;  Location: [facility];  Service: Orthopedics;  Laterality: Right;  . RETINAL LASER PROCEDURE Bilateral    "for diabetic retinopathy"    There were no vitals filed for this visit.  Subjective Assessment - 12/16/17 0903    Subjective  Still having some swelling in R knee cap; no pain unless he is on his knees to mop up something or performing exercises.  Did not bring in paperwork for [employer] funding; not sure if he still wants it.  Asking  about timeline for coming back to therapy.    Pertinent History  MS dx about 6 years ago    How long can you sit comfortably?  unlimited    How long can you stand comfortably?  unlimited    How long can you walk comfortably?  household distances due to LE fatigue/weakness    Diagnostic tests  X-ray    Patient Stated Goals  Walk without AD;  On 06/17/17 patient states "for me, coming off of that (RW) is how to measure improvement".     Currently in Pain?  No/denies    Pain Onset  1 to 4 weeks ago         [facility] PT Assessment - 12/16/17 0913      Assessment   Medical Diagnosis  R tibia/fibula fx, s/p ORIF      Standardized Balance Assessment   Standardized Balance Assessment  Berg Balance Test;10 meter walk test    10 Meter Walk  36.87 seconds or .88 ft/sec      Berg Balance Test   Sit to Stand  Able to stand  independently using hands    Standing Unsupported  Able to stand safely 2 minutes    Sitting with Back Unsupported but Feet Supported on Floor or Stool  Able to sit safely and securely 2 minutes    Stand to  Sit  Controls descent by using hands    Transfers  Able to transfer safely, definite need of hands    Standing Unsupported with Eyes Closed  Able to stand 10 seconds safely    Standing Ubsupported with Feet Together  Able to place feet together independently and stand for 1 minute with supervision    From Standing, Reach Forward with Outstretched Arm  Can reach confidently >25 cm (10")    From Standing Position, Pick up Object from [place] to pick up shoe safely and easily    From Standing Position, Turn to Look Behind Over each Shoulder  Looks behind one side only/other side shows less weight shift    Turn 360 Degrees  Needs assistance while turning    Standing Unsupported, Alternately Place Feet on Step/Stool  Able to complete >2 steps/needs minimal assist    Standing Unsupported, One Foot in Front  Needs help to step but can hold 15 seconds    Standing on One Leg  Able  to lift leg independently and hold 5-10 seconds    Total Score  40    Berg comment:  40/56       Patient demonstrates increased fall risk as noted by score of 40/56 on Berg Balance Scale.  (<36= high risk for falls, close to 100%; 37-45 significant >80%; 46-51 moderate >50%; 52-55 lower >25%)    [facility] Adult PT Treatment/Exercise - 12/16/17 1135      Therapeutic Activites    Therapeutic Activities  Other Therapeutic Activities    Other Therapeutic Activities  Discussed symptoms of pre-patellar bursitis and management of swelling (avoid pressure on patella, ice, NSAIDs if approved by MD).  Discussed decision to pursue or not pursue [employer] home unit.  Pt states he may contact other agencies for funding but isn't sure if he wants it anymore.  Discussed again the process of finding extra funding and benefit of daily use with home unit.  Pt will continue to consider.  Also discussed time line for return to therapy, how to maintain gains made in therapy (wellness program, HEP, staying active within a safe environment) and indications for returning to therapy (decline in function, falls, exacerbation of MS symptoms or pt has improved and would like to progress off of cane or progress in another area).               PT Education - 12/17/17 1139    Education provided  Yes    Education Details  See TA    Person(s) Educated  Patient    Methods  Explanation    Comprehension  Verbalized understanding       PT Short Term Goals - 10/17/17 1502      PT SHORT TERM GOAL #1   Title  = LTG (cert written for 60 days but anticipate D/C at 4 weeks)        PT Long Term Goals - 12/16/17 0912      PT LONG TERM GOAL #1   Title  Will be independent with HEP with standing HEP and [employer] HEP if approved    Baseline  performing current HEP, still awaiting [employer] decision    Time  4    Period  Weeks    Status  On-going      PT LONG TERM GOAL #2   Title  Pt will ambulate 150' outside on pavement  and negotiate curb with supervision with AFO and cane    Baseline  performed with  min A    Time  4    Period  Weeks    Status  On-going      PT LONG TERM GOAL #3   Title  Pt will demonstrate independence with [employer] home unit (donning/doffing, wear schedule, skin care, care of components)    Baseline  Patient not pursuing at this time    Time  4    Period  Weeks    Status  Deferred      PT LONG TERM GOAL #5   Title  Pt will decrease falls risk as indicated by increase in BERG score to > or = 50/56    Baseline  40/56 decrease after knee injury    Time  4    Period  Weeks    Status  Not Met      PT LONG TERM GOAL #7   Title  Pt will improve gait velocity to > or = 2.8 ft/sec with LRAD and appropriate AFO     Baseline  .88 ft/sec with cane and AFO - decline after knee injury; advised to utilize RW when LE weak or fatigued    Time  4    Period  Weeks    Status  Not Met            Plan - 12/17/17 1140    Clinical Impression Statement  Treatment session today focused on continued education regarding knee injury, home [employer] unit, maintenance of gains made in therapy and indications for return to therapy.  Initiated assessment of progress towards LTG with re-assessment of balance and gait velocity.  Since injury to R knee pt has experienced a slight decline in function and mobility as indicated by decrease in gait velocity and BERG score.  Anticipate pt will still be ready for D/C after next visit - therapy has discussed with pt ways to manage knee symptoms, safety with gait/AD and exercises to continue to perform to return to baseline before injury.  Pt also understands that next visit will be final visit and is agreeable.  Will assess final goals and D/C next session.    Rehab Potential  Good    Clinical Impairments Affecting Rehab Potential  MS     PT Frequency  2x / week    PT Duration  4 weeks    PT Treatment/Interventions  ADLs/Self Care Home  Management;Cryotherapy;Vasopneumatic Device;Taping;Gait training;Stair training;Functional mobility training;Therapeutic activities;Therapeutic exercise;Balance training;Neuromuscular re-education;Passive range of motion;Manual techniques;Patient/family education;Electrical Stimulation;DME Instruction;Orthotic Fit/Training;Energy conservation    PT Next Visit Plan  check final LTG: finalize HEP and gait outside with cane; D/C    PT Home Exercise Plan  Pt has program at home for strengthening and balance    Consulted and Agree with Plan of Care  Patient       Patient will benefit from skilled therapeutic intervention in order to improve the following deficits and impairments:  Abnormal gait, Pain, Decreased range of motion, Decreased balance, Decreased mobility, Difficulty walking, Decreased activity tolerance, Decreased strength, Decreased endurance, Impaired flexibility, Impaired sensation  Visit Diagnosis: Muscle weakness (generalized)  Unsteadiness on feet  Difficulty in walking, not elsewhere classified  Foot drop, right  Foot drop, left     Problem List Patient Active Problem List   Diagnosis Date Noted  . Vitamin D insufficiency 11/29/2016  . Hypogonadism in male 11/01/2016  . Type 1 diabetes mellitus ([place])   . Hypothyroidism   . Hypertension   . Hyperlipidemia   . Closed fracture of right fibula  and tibia 10/29/2016  . Closed tibia fracture 10/29/2016  . Closed fracture of right tibial plafond with fibula involvement 10/29/2016  . Abnormal liver function tests 03/06/2013  . RASH AND OTHER NONSPECIFIC SKIN ERUPTION 10/07/2009  . SHOULDER PAIN, LEFT 12/27/2008  . Type I (juvenile type) diabetes mellitus without mention of complication, uncontrolled 12/03/2007  . HYPOTHYROIDISM 11/12/2007  . HYPERCHOLESTEROLEMIA 11/12/2007  . MULTIPLE SCLEROSIS 08/29/2007  . PROLIFERATIVE DIABETIC RETINOPATHY 08/29/2007  . HYPERTENSION 12/25/2006   Johnathan Ross, PT, DPT 12/17/17     11:52 AM    [facility] [address] [place], [place], [address] Phone: [phone]   Fax:  [phone]  Name: Johnathan Ross MRN: [id number] Date of Birth: 03-Mar-1971

## 2017-12-20 ENCOUNTER — Encounter: Payer: Self-pay | Admitting: Physical Therapy

## 2017-12-20 ENCOUNTER — Ambulatory Visit: Payer: Medicare Other | Admitting: Physical Therapy

## 2017-12-20 DIAGNOSIS — M6281 Muscle weakness (generalized): Secondary | ICD-10-CM

## 2017-12-20 DIAGNOSIS — R2681 Unsteadiness on feet: Secondary | ICD-10-CM | POA: Diagnosis not present

## 2017-12-20 DIAGNOSIS — M21371 Foot drop, right foot: Secondary | ICD-10-CM

## 2017-12-20 DIAGNOSIS — M21372 Foot drop, left foot: Secondary | ICD-10-CM

## 2017-12-20 DIAGNOSIS — R262 Difficulty in walking, not elsewhere classified: Secondary | ICD-10-CM

## 2017-12-20 NOTE — Patient Instructions (Signed)
Access Code: [email or web address]  URL: [email or web address]  Date: 12/20/2017  Prepared by: Misty Stanley   Exercises  Quadruped Hip Extension Kicks - 10 reps - 1 sets - 1x daily - 5x weekly  Quadruped Fire Hydrant - 10 reps - 1 sets - 1x daily - 5x weekly  Bird Dog - 10 reps - 1 sets - 1x daily - 5x weekly  Side Plank on Knees - 6 reps - 1 sets - 1x daily - 5x weekly  Tall Kneeling Chop - 10 reps - 1 sets - 1x daily - 5x weekly  Half-Kneeling to Standing - 3 reps - 1 sets - 10 hold - 1x daily - 5x weekly  Standing Alternating Knee Flexion - 10 reps - 2 sets - 1x daily - 5x weekly  Single Leg Stance with Support - 4 sets - 10 seconds hold - 1x daily - 5x weekly  Wall Quarter Squat - 3 reps - 10 second hold - 1x daily - 5x weekly  Single Leg Squat with Counter Support - 5 reps - 2 sets - 1x daily - 5x weekly  Tandem Stance in Corner - 2 reps - 10 seconds hold - 1x daily - 5x weekly  Standing Quarter Turn with Counter Support - 3 sets - 1x daily - 5x weekly  Marching Bridge - 5 reps - 2 sets - 1x daily - 5x weekly

## 2017-12-20 NOTE — Addendum Note (Signed)
Addended by: Misty Stanley F on: 12/20/2017 10:33 AM   Modules accepted: Orders

## 2017-12-20 NOTE — Therapy (Addendum)
[facility] [address] [place], [place], [address] Phone: [phone]   Fax:  [phone]  Physical Therapy Treatment and D/C Summary  Patient Details  Name: Johnathan Ross MRN: [id number] Date of Birth: Jun 26, 1970 Referring Provider: Altamese [place], MD   Encounter Date: 12/20/2017  PT End of Session - 12/20/17 1011    Visit Number  61    Number of Visits  88 # updated; pt will continue 4 more weeks    Date for PT Re-Evaluation  12/19/17    Authorization Type  Medicare - send every 10th visit note to referring physician for PN    PT Start Time  0845    PT Stop Time  0935    PT Time Calculation (min)  50 min    Activity Tolerance  Patient tolerated treatment well    Behavior During Therapy  [facility] for tasks assessed/performed       Past Medical History:  Diagnosis Date  . Hyperlipidemia   . Hypertension   . Hypogonadism in male 11/01/2016  . Hypothyroidism   . Multiple sclerosis ([place])   . 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 Type 1 diabetes mellitus ([place]) dx'd 1994  . Vitamin D insufficiency 11/29/2016    Past Surgical History:  Procedure Laterality Date  . EYE SURGERY Bilateral    "laser for diabetic retinopathy"  . FRACTURE SURGERY    . OPEN REDUCTION INTERNAL FIXATION (ORIF) TIBIA/FIBULA FRACTURE Right 10/30/2016  . ORIF ANKLE FRACTURE Left 2015  . ORIF TIBIA FRACTURE Right 10/30/2016   Procedure: OPEN REDUCTION INTERNAL FIXATION (ORIF) TIBIA FIBULA FRACTURE;  Surgeon: Altamese , MD;  Location: [facility];  Service: Orthopedics;  Laterality: Right;  . RETINAL LASER PROCEDURE Bilateral    "for diabetic retinopathy"    There were no vitals filed for this visit.  Subjective Assessment - 12/20/17 0852    Subjective  Still having swelling in knee but no pain.  Brought RW in today.    Pertinent History  MS dx about 6 years ago    How long can you sit comfortably?  unlimited    How long can you stand  comfortably?  unlimited    How long can you walk comfortably?  household distances due to LE fatigue/weakness    Diagnostic tests  X-ray    Patient Stated Goals  Walk without AD;  On 06/17/17 patient states "for me, coming off of that (RW) is how to measure improvement".     Currently in Pain?  No/denies    Pain Onset  1 to 4 weeks ago                       [facility] Adult PT Treatment/Exercise - 12/20/17 0853      Ambulation/Gait   Ambulation/Gait  Yes    Ambulation/Gait Assistance  5: Supervision    Ambulation/Gait Assistance Details  outside x 200' over uneven pavement; verbal cues to attend to cracks in sidewalk and inclines; intermittent standing rest breaks    Ambulation Distance (Feet)  200 Feet    Assistive device  Rolling walker    Ambulation Surface  Unlevel;Outdoor;Paved    Curb  5: Supervision    Curb Details (indicate cue type and reason)  with RW       Access Code: [email or web address]  URL: [email or web address]  Date: 12/20/2017  Prepared by: Misty Stanley   Exercises  Quadruped Hip Extension Kicks - 10 reps - 1 sets - 1x  daily - 5x weekly  Quadruped Fire Hydrant - 10 reps - 1 sets - 1x daily - 5x weekly  Bird Dog - 10 reps - 1 sets - 1x daily - 5x weekly  Side Plank on Knees - 6 reps - 1 sets - 1x daily - 5x weekly  Tall Kneeling Chop - 10 reps - 1 sets - 1x daily - 5x weekly  Half-Kneeling to Standing - 3 reps - 1 sets - 10 hold - 1x daily - 5x weekly  Standing Alternating Knee Flexion - 10 reps - 2 sets - 1x daily - 5x weekly  Single Leg Stance with Support - 4 sets - 10 seconds hold - 1x daily - 5x weekly  Wall Quarter Squat - 3 reps - 10 second hold - 1x daily - 5x weekly  Single Leg Squat with Counter Support - 5 reps - 2 sets - 1x daily - 5x weekly  Tandem Stance in Corner - 2 reps - 10 seconds hold - 1x daily - 5x weekly  Standing Quarter Turn with Counter Support - 3 sets - 1x daily - 5x weekly  Marching Bridge - 5 reps - 2 sets - 1x daily -  5x weekly        PT Education - 12/20/17 1011    Education provided  Yes    Education Details  final HEP    Person(s) Educated  Patient    Methods  Explanation;Demonstration;Handout    Comprehension  Verbalized understanding;Returned demonstration       PT Short Term Goals - 10/17/17 1502      PT SHORT TERM GOAL #1   Title  = LTG (cert written for 60 days but anticipate D/C at 4 weeks)        PT Long Term Goals - 12/20/17 1012      PT LONG TERM GOAL #1   Title  Will be independent with HEP with standing HEP and [employer] HEP if approved    Baseline  performing current HEP, still awaiting [employer] decision    Time  4    Period  Weeks    Status  Achieved      PT LONG TERM GOAL #2   Title  Pt will ambulate 150' outside on pavement and negotiate curb with supervision with AFO and cane    Baseline  supervision with RW and AFO    Time  4    Period  Weeks    Status  Partially Met      PT LONG TERM GOAL #3   Title  Pt will demonstrate independence with [employer] home unit (donning/doffing, wear schedule, skin care, care of components)    Baseline  Patient not pursuing at this time    Time  4    Period  Weeks    Status  Deferred      PT LONG TERM GOAL #5   Title  Pt will decrease falls risk as indicated by increase in BERG score to > or = 50/56    Baseline  40/56 decrease after knee injury    Time  4    Period  Weeks    Status  Not Met      PT LONG TERM GOAL #7   Title  Pt will improve gait velocity to > or = 2.8 ft/sec with LRAD and appropriate AFO     Baseline  .88 ft/sec with cane and AFO - decline after knee injury; advised to utilize RW when LE weak  or fatigued    Time  4    Period  Weeks    Status  Not Met            Plan - 12/20/17 1013    Clinical Impression Statement  Completed assessment of LTG today with gait outside and finalizing HEP.  Pt met 1/5 LTG and was on target to meet the rest of his LTG but due to recent fall and injury to knee pt had  slight decline in gait velocity with cane and balance as indicated by BERG; pt continues to ambulate safely with RW with supervision-Mod I and it has been recommended that pt continue to use RW outside the home, on uneven surfaces and when LE are fatigued.  Pt also recommended to only perform supine and standing HEP for now until edema in R knee has resolved to avoid continued pressure on pre patellar bursa.  Pt has been given final HEP to continue to address LE and core weakness and balance and anticipate as knee heals pt will return to previous level with cane.  Pt is agreeable to D/C today.      Rehab Potential  Good    Clinical Impairments Affecting Rehab Potential  MS     PT Frequency  2x / week    PT Duration  4 weeks    PT Treatment/Interventions  ADLs/Self Care Home Management;Cryotherapy;Vasopneumatic Device;Taping;Gait training;Stair training;Functional mobility training;Therapeutic activities;Therapeutic exercise;Balance training;Neuromuscular re-education;Passive range of motion;Manual techniques;Patient/family education;Electrical Stimulation;DME Instruction;Orthotic Fit/Training;Energy conservation    PT Next Visit Plan  D/C    PT Home Exercise Plan  --    Consulted and Agree with Plan of Care  Patient       Patient will benefit from skilled therapeutic intervention in order to improve the following deficits and impairments:  Abnormal gait, Pain, Decreased range of motion, Decreased balance, Decreased mobility, Difficulty walking, Decreased activity tolerance, Decreased strength, Decreased endurance, Impaired flexibility, Impaired sensation  Visit Diagnosis: Muscle weakness (generalized)  Unsteadiness on feet  Difficulty in walking, not elsewhere classified  Foot drop, right  Foot drop, left     Problem List Patient Active Problem List   Diagnosis Date Noted  . Vitamin D insufficiency 11/29/2016  . Hypogonadism in male 11/01/2016  . Type 1 diabetes mellitus ([place])   .  Hypothyroidism   . Hypertension   . Hyperlipidemia   . Closed fracture of right fibula and tibia 10/29/2016  . Closed tibia fracture 10/29/2016  . Closed fracture of right tibial plafond with fibula involvement 10/29/2016  . Abnormal liver function tests 03/06/2013  . RASH AND OTHER NONSPECIFIC SKIN ERUPTION 10/07/2009  . SHOULDER PAIN, LEFT 12/27/2008  . Type I (juvenile type) diabetes mellitus without mention of complication, uncontrolled 12/03/2007  . HYPOTHYROIDISM 11/12/2007  . HYPERCHOLESTEROLEMIA 11/12/2007  . MULTIPLE SCLEROSIS 08/29/2007  . PROLIFERATIVE DIABETIC RETINOPATHY 08/29/2007  . HYPERTENSION 12/25/2006   .PHYSICAL THERAPY DISCHARGE SUMMARY  Visits from Start of Care: 12  Current functional level related to goals / functional outcomes: See impression statement and LTG above   Remaining deficits: Impaired strength, balance, gait, falls risk   Education / Equipment: HEP, AFO  Plan: Patient agrees to discharge.  Patient goals were partially met. Patient is being discharged due to being pleased with the current functional level.  ?????     [name], PT, DPT 12/20/17    10:29 AM    [facility] [address] [place], [place], [address] Phone:  [phone]   Fax:  [phone]  Name: Johnathan Ross MRN: [id number] Date of Birth: May 04, 1971

## 2017-12-22 NOTE — Progress Notes (Signed)
This encounter was created in error - please disregard.

## 2017-12-23 ENCOUNTER — Encounter: Payer: Medicare Other | Admitting: Endocrinology

## 2017-12-23 ENCOUNTER — Encounter: Payer: Self-pay | Admitting: Endocrinology

## 2017-12-23 ENCOUNTER — Other Ambulatory Visit (INDEPENDENT_AMBULATORY_CARE_PROVIDER_SITE_OTHER): Payer: Medicare Other

## 2017-12-23 VITALS — BP 142/78 | HR 97 | Ht 73.0 in | Wt 209.6 lb

## 2017-12-23 DIAGNOSIS — E063 Autoimmune thyroiditis: Secondary | ICD-10-CM

## 2017-12-23 DIAGNOSIS — E1065 Type 1 diabetes mellitus with hyperglycemia: Secondary | ICD-10-CM

## 2017-12-23 LAB — COMPREHENSIVE METABOLIC PANEL
ALT: 17 U/L (ref 0–53)
AST: 18 U/L (ref 0–37)
Albumin: 4.3 g/dL (ref 3.5–5.2)
Alkaline Phosphatase: 97 U/L (ref 39–117)
BUN: 16 mg/dL (ref 6–23)
CO2: 26 mEq/L (ref 19–32)
Calcium: 9.8 mg/dL (ref 8.4–10.5)
Chloride: 101 mEq/L (ref 96–112)
Creatinine, Ser: 1.23 mg/dL (ref 0.40–1.50)
GFR: 81.7 mL/min (ref >60.00)
Glucose, Bld: 166 mg/dL — ABNORMAL HIGH (ref 70–99)
Potassium: 4.1 mEq/L (ref 3.5–5.1)
Sodium: 139 mEq/L (ref 135–145)
Total Bilirubin: 0.6 mg/dL (ref 0.2–1.2)
Total Protein: 7.2 g/dL (ref 6.0–8.3)

## 2017-12-23 LAB — MICROALBUMIN / CREATININE URINE RATIO
Creatinine,U: 182.8 mg/dL
Microalb Creat Ratio: 0.6 mg/g (ref 0.0–30.0)
Microalb, Ur: 1.1 mg/dL (ref 0.0–1.9)

## 2017-12-23 LAB — TSH: TSH: 1.12 u[IU]/mL (ref 0.35–4.50)

## 2017-12-23 LAB — T4, FREE: Free T4: 1.22 ng/dL (ref 0.60–1.60)

## 2017-12-24 LAB — FRUCTOSAMINE: Fructosamine: 357 umol/L — ABNORMAL HIGH (ref 0–285)

## 2017-12-25 ENCOUNTER — Encounter: Payer: Self-pay | Admitting: Endocrinology

## 2017-12-25 ENCOUNTER — Ambulatory Visit (INDEPENDENT_AMBULATORY_CARE_PROVIDER_SITE_OTHER): Payer: Medicare Other | Admitting: Endocrinology

## 2017-12-25 VITALS — BP 140/80 | HR 97 | Ht 73.0 in | Wt 209.6 lb

## 2017-12-25 DIAGNOSIS — E063 Autoimmune thyroiditis: Secondary | ICD-10-CM | POA: Diagnosis not present

## 2017-12-25 DIAGNOSIS — E1065 Type 1 diabetes mellitus with hyperglycemia: Secondary | ICD-10-CM | POA: Diagnosis not present

## 2017-12-25 NOTE — Progress Notes (Signed)
Subjective:              Patient ID: Johnathan Ross, male   DOB: 1970-06-05, 47 y.o.   MRN: [id number]  Diabetes    Diagnosis: Type 1 diabetes mellitus, date of diagnosis: 1995.   Insulin Pump followup:   CURRENT brand:  Medtronic  630 G   HISTORY: An insulin pump has been in use since 03/13/11.   PUMP settings are basal rate:   Basal rate at 11 AM = 1.7 and from 12 noon until 3 PM = 1.6   1.5 until 4 a.m; 4 AM = 1.35. 6 a.m.= 1.7. 9 AM = 1.4,11 a.m. = 0.9. 12 noon = 1.2, 2 p.m. = 1. 45. 3 PM = 2.15 and 6 p.m. = 1.8   Carbohydrate to insulin ratio: 1: 10, at 11 a.m. = 1: 10, after 6 p.m. = 1:7, hyperglycemia correction factor: 1: 30,and after 10 p.m. 1:30, target 120, active insulin time 4 hours.  Changing infusion set every 3-4 days  Recent history:  His A1c need to go up and is recently 8.4% Fructosamine is also high at 357   Current blood sugar patterns Evaluated from freestyle Libre and pump download, management and problems identified:   HYPERGLYCEMIA has been occurring variably at all different times as follows:  Will have occasional significantly high readings overnight probably from high sugars at night before  He will frequently have high blood sugars after his evening meal although occasionally may be high before he is eating also  Sometimes has high blood sugars after lunch and this may be related to sometimes eating fast food or other high carbohydrate meals without adequate coverage  Rarely high sugars may be related to not bolusing  Also has HYPERGLYCEMIA frequently before lunch even though he is having breakfast relatively early in the morning and not high fat  HYPOGLYCEMIA has been minimal although he thinks he is symptomatic when his blood sugars are in the 70 range  FASTING blood sugars have been usually fairly good on an average but low normal the last 3 days with minimal overnight hypoglycemia  He has used his FreeStyle libre consistently  now  Again has marked variability in his blood sugars with recent daily CGM average ranging from 134 up to 226  boluses: He is not always entering his carbohydrates or blood sugars at the time of meals and frequently giving empirical bolus amounts  Occasionally having problems with his infusion set also with blood sugars going up over 400 but this is infrequent recently  Treatment of hypoglycemia: He is usually drinking juice for low sugars and usually can detect symptoms of low sugars  CGM use % of time  90  Average and SD  183+/-69  Time in range  44     %  % Time Above 180  52  % Time above 250  15  % Time Below target  for   Mean values apply above for all meters except median for One Touch  PRE-MEAL Fasting Lunch Dinner Bedtime Overall  Glucose range:       Mean/median:  143  172  183   183   POST-MEAL PC Breakfast PC Lunch PC Dinner  Glucose range:     Mean/median:  171  207  221      Wt Readings from Last 3 Encounters:  12/25/17 209 lb 9.6 oz (95.1 kg)  12/23/17 209 lb 9.6 oz (95.1 kg)  10/28/17 213 lb (96.6 kg)  Lab Results  Component Value Date   HGBA1C 8.4 (H) 10/23/2017   HGBA1C 8.0 (H) 07/23/2017   HGBA1C 7.4 03/21/2017   Lab Results  Component Value Date   MICROALBUR 1.1 12/23/2017   LDLCALC 70 07/23/2017   CREATININE 1.23 12/23/2017     OTHER active problems discussed today: See review of systems    Allergies as of 12/25/2017   No Known Allergies     Medication List        Accurate as of 12/25/17 10:35 AM. Always use your most recent med list.          AMPYRA 10 MG Tb12 Generic drug:  dalfampridine Take 10 mg by mouth 2 (two) times daily. Take one tablet (10 mg) by mouth every morning and mid afternoon   diclofenac 75 MG EC tablet Commonly known as:  VOLTAREN Take 1 tablet (75 mg total) by mouth 2 (two) times daily.   glucose blood test strip Commonly known as:  CONTOUR NEXT TEST USE AS INSTRUCTED TO CHECK BLOOD SUGARS 8  TIMES PER DAY DX CODE E10.65   insulin aspart 100 UNIT/ML injection Commonly known as:  NOVOLOG USE UP TO 80 UNITS IN INSULIN PUMP DAILY-DX CODE E10.8   insulin pump Soln Inject into the skin continuous. Novolog   levothyroxine 175 MCG tablet Commonly known as:  SYNTHROID, LEVOTHROID TAKE 1 TABLET (175 MCG TOTAL) BY MOUTH DAILY BEFORE BREAKFAST.   lisinopril 10 MG tablet Commonly known as:  PRINIVIL,ZESTRIL TAKE 1 TABLET BY MOUTH EVERY DAY   simvastatin 20 MG tablet Commonly known as:  ZOCOR TAKE 1 TABLET BY MOUTH AT BEDTIME       Allergies: No Known Allergies  Past Medical History:  Diagnosis Date  . Hyperlipidemia   . Hypertension   . Hypogonadism in male 11/01/2016  . Hypothyroidism   . Multiple sclerosis ([place])   . 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 Type 1 diabetes mellitus ([place]) dx'd 1994  . Vitamin D insufficiency 11/29/2016    Past Surgical History:  Procedure Laterality Date  . EYE SURGERY Bilateral    "laser for diabetic retinopathy"  . FRACTURE SURGERY    . OPEN REDUCTION INTERNAL FIXATION (ORIF) TIBIA/FIBULA FRACTURE Right 10/30/2016  . ORIF ANKLE FRACTURE Left 2015  . ORIF TIBIA FRACTURE Right 10/30/2016   Procedure: OPEN REDUCTION INTERNAL FIXATION (ORIF) TIBIA FIBULA FRACTURE;  Surgeon: Altamese [place], MD;  Location: [facility];  Service: Orthopedics;  Laterality: Right;  . RETINAL LASER PROCEDURE Bilateral    "for diabetic retinopathy"    Family History  Problem Relation Age of Onset  . Diabetes Sister   . Colon cancer Maternal Grandmother   . Colon polyps Maternal Uncle     Social History:  reports that he has never smoked. He has never used smokeless tobacco. He reports that he does not drink alcohol or use drugs.    Review of Systems      HYPERCHOLESTEROLEMIA: He has been taking simvastatin 20 mg   LDL is below 70   Also no increase in ALT  Lab Results  Component Value Date   CHOL 117 07/23/2017   HDL 37.70 (L) 07/23/2017    LDLCALC 70 07/23/2017   LDLDIRECT 139.7 04/09/2014   TRIG 44.0 07/23/2017   CHOLHDL 3 07/23/2017   Lab Results  Component Value Date   ALT 17 12/23/2017    HYPERTENSION:  has  been on lisinopril with good control  of blood pressure   BP Readings from Last 3  Encounters:  12/25/17 140/80  12/23/17 (!) 142/78  12/02/17 136/75     He has MULTIPLE sclerosis, Followed in [place] by neurologist  HYPOTHYROIDISM: This is long-standing  His thyroid requirement has changed periodically and has been taking higher doses in 2018  More recently he does has been somewhat stable and is now taking a prescription for 175 mcg daily Does not feel unusually fatigued when TSH is mildly increased   Lab Results  Component Value Date   TSH 1.12 12/23/2017   TSH 0.96 10/23/2017   TSH 1.50 07/23/2017   FREET4 1.22 12/23/2017   FREET4 1.41 10/23/2017   FREET4 1.28 07/23/2017        Objective:   Physical Exam  BP 140/80 (BP Location: Left Arm, Patient Position: Sitting, Cuff Size: Normal)   Pulse 97   Ht 6\' 1"  (1.854 m)   Wt 209 lb 9.6 oz (95.1 kg)   SpO2 99%   BMI 27.65 kg/m          Assessment:      DIABETES type I on insulin pump:  See history of present illness for detailed discussion of current diabetes management, blood sugar patterns and problems identified  He has a higher A1c of 8.4% recently and also high fructosamine  As discussed above he has periods of hyperglycemia and his highest blood sugars are averaging over 200 in the afternoon and after evening meal Also may be getting relatively high before lunch and periodically overnight He can also do better with entering his carbohydrates and blood sugars at the time of boluses Discussed day-to-day management of his pump and boluses  Also he is sometimes eating relatively higher fat meals and blood sugar may be relatively low right after eating with taking somewhat a high amount of coverage   HYPOTHYROIDISM:    His thyroid levels have been consistently back to normal with 175 mcg   HYPERTENSION: His blood pressure is again fairly well controlled      Plan:      He will keep a record of what he is eating for 3 different days with all meals and snacks and blood sugar before and after eating He will need to review this with the nurse educator as discussed  Discussed needing to make sure he enters his carbohydrates every time he eats He needs to also have protein with breakfast daily If he is having a relatively higher fat meal he can use a dual bolus with 50/50 spread over 2 hours CARBOHYDRATE coverage at dinnertime will be 1: 7 Basal rate to be reduced at 4 AM down to 1.15 and at 6 AM down to 1.50 Basal rate increased at 11 AM up to 1.8 and 6 PM at 1.9  Urine microalbumin negative  THYROID: He will continue 175 g daily   LIPIDS: Well controlled as of 2/19  Counseling time on subjects discussed in assessment and plan sections is over 50% of today's 25 minute visit    There are no Patient Instructions on file for this visit.     Elayne Snare

## 2017-12-31 ENCOUNTER — Encounter: Payer: Medicare Other | Admitting: Nutrition

## 2018-02-03 DIAGNOSIS — Z5181 Encounter for therapeutic drug level monitoring: Secondary | ICD-10-CM | POA: Diagnosis not present

## 2018-02-03 DIAGNOSIS — Z79899 Other long term (current) drug therapy: Secondary | ICD-10-CM | POA: Diagnosis not present

## 2018-02-03 DIAGNOSIS — R5383 Other fatigue: Secondary | ICD-10-CM | POA: Diagnosis not present

## 2018-02-03 DIAGNOSIS — G35 Multiple sclerosis: Secondary | ICD-10-CM | POA: Diagnosis not present

## 2018-02-26 ENCOUNTER — Other Ambulatory Visit: Payer: Self-pay | Admitting: Endocrinology

## 2018-02-28 ENCOUNTER — Telehealth: Payer: Self-pay | Admitting: Endocrinology

## 2018-02-28 NOTE — Telephone Encounter (Signed)
Jinny Blossom called in to get the patient's last progress notes faxed over to [phone].

## 2018-02-28 NOTE — Telephone Encounter (Signed)
Faxed last OV to [place] at [facility], [phone], fax confirmed at 02/28/18 11:54am

## 2018-03-12 ENCOUNTER — Other Ambulatory Visit: Payer: Self-pay | Admitting: Endocrinology

## 2018-03-28 ENCOUNTER — Other Ambulatory Visit: Payer: Self-pay

## 2018-03-28 ENCOUNTER — Encounter: Payer: Self-pay | Admitting: Physical Therapy

## 2018-03-28 ENCOUNTER — Ambulatory Visit: Payer: Medicare Other | Attending: Urology | Admitting: Physical Therapy

## 2018-03-28 DIAGNOSIS — M21371 Foot drop, right foot: Secondary | ICD-10-CM | POA: Diagnosis not present

## 2018-03-28 DIAGNOSIS — R2681 Unsteadiness on feet: Secondary | ICD-10-CM | POA: Diagnosis not present

## 2018-03-28 DIAGNOSIS — M6281 Muscle weakness (generalized): Secondary | ICD-10-CM

## 2018-03-28 DIAGNOSIS — R262 Difficulty in walking, not elsewhere classified: Secondary | ICD-10-CM

## 2018-03-28 NOTE — Therapy (Signed)
[facility] [address] [place], [place], [address] Phone: [phone]   Fax:  [phone]  Physical Therapy Evaluation  Patient Details  Name: Johnathan Ross MRN: [id number] Date of Birth: [birth year] Referring Provider (PT): Dr. Hyman Bower   Encounter Date: 03/28/2018  PT End of Session - 03/28/18 1259    Visit Number  1    Number of Visits  17    Date for PT Re-Evaluation  05/27/18    Authorization Type  Medicare Part A and B- * Requires 10th visit PN    PT Start Time  0845    PT Stop Time  0930    PT Time Calculation (min)  45 min    Equipment Utilized During Treatment  Gait belt    Activity Tolerance  Patient tolerated treatment well    Behavior During Therapy  [facility] for tasks assessed/performed;Flat affect       Past Medical History:  Diagnosis Date  . Hyperlipidemia   . Hypertension   . Hypogonadism in male 11/01/2016  . Hypothyroidism   . Multiple sclerosis ([place])   . 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 Type 1 diabetes mellitus ([place]) dx'd 1994  . Vitamin D insufficiency 11/29/2016    Past Surgical History:  Procedure Laterality Date  . EYE SURGERY Bilateral    "laser for diabetic retinopathy"  . FRACTURE SURGERY    . OPEN REDUCTION INTERNAL FIXATION (ORIF) TIBIA/FIBULA FRACTURE Right 10/30/2016  . ORIF ANKLE FRACTURE Left 2015  . ORIF TIBIA FRACTURE Right 10/30/2016   Procedure: OPEN REDUCTION INTERNAL FIXATION (ORIF) TIBIA FIBULA FRACTURE;  Surgeon: Altamese [place], MD;  Location: [facility];  Service: Orthopedics;  Laterality: Right;  . RETINAL LASER PROCEDURE Bilateral    "for diabetic retinopathy"    There were no vitals filed for this visit.   Subjective Assessment - 03/28/18 0853    Subjective  Reports having issues with his balance. Reports his goal continues to be able to walk without the cane. Reports he furniture walks at home without the cane but primarily uses the cane in the  community with occassional use of his RW when visiting restaurants. Reports endurance continues to be an issue requiring him to take multiple standing rest breaks during ambulation. Reports he takes Ampyra 2x per day with no issues.    Patient is accompained by:  Family member    Pertinent History  MS (dx 6 years prior), Type 1 DM, HLD, HTN, Hypogonadism in male, Hypothyroidism, Proliferative Diabetic Retinopathy, Vitamin D Insufficiency     Limitations  Lifting;Standing;Walking    How long can you sit comfortably?  unlimited    How long can you walk comfortably?  takes frequent standing rest breaks during gait     Patient Stated Goals  Walk without an AD and improve his balance to increase safety with functional mobility     Currently in Pain?  No/denies         [facility] PT Assessment - 03/28/18 0858      Assessment   Medical Diagnosis  MS    Referring Provider (PT)  Dr. Hyman Bower    Onset Date/Surgical Date  02/27/18    Hand Dominance  Right    Next MD Visit  Follow up on 08/04/2018 per chart    Prior Therapy  Physical therapy for prior experience with ankle fracture and to improve functional mobility and balance with MS dx      Precautions   Precautions  Fall  Restrictions   Weight Bearing Restrictions  No      Balance Screen   Has the patient fallen in the past 6 months  No    Has the patient had a decrease in activity level because of a fear of falling?   No    Is the patient reluctant to leave their home because of a fear of falling?   No      Home Film/video editor residence    Living Arrangements  Other (Comment)   girlfriend and son   Available Help at Discharge  Family    Type of [place] to enter    Entrance Stairs-Number of Steps  6    Entrance Stairs-Rails  Can reach both    [place]  One level    [place] - 2 wheels;[place] - single point      Prior Function   Level of Independence   Independent    Vocation  On disability    Leisure  Still does everything but it takes longer       Cognition   Overall Cognitive Status  Within Functional Limits for tasks assessed      Sensation   Light Touch  Appears Intact      Coordination   Gross Motor Movements are Fluid and Coordinated  No    Heel Shin Test  R=L WNL      ROM / Strength   AROM / PROM / Strength  AROM;Strength      AROM   Overall AROM   Within functional limits for tasks performed   R DF limited 2/2 to R AFO for foot drop     Strength   Overall Strength  Deficits    Overall Strength Comments  Grossly assessed in sitting via MMT to be 4/5 bilaterally with deficits noted to R hip flexion -4/5       Transfers   Transfers  Sit to Stand;Stand to Sit;Floor to Transfer    Sit to Stand  5: Supervision;Without upper extremity assist    Five time sit to stand comments   Pt performs 5x STS from standard height chair with no UE assist in 17.28 seconds indicative of high fall risk and LE power deficits    Stand to Sit  5: Supervision;Without upper extremity assist      Ambulation/Gait   Ambulation/Gait  Yes    Ambulation/Gait Assistance  5: Supervision    Ambulation/Gait Assistance Details  Pt performs 40 feet on level surface with his Hurrycane to assess gait speed    Ambulation Distance (Feet)  40 Feet    Assistive device  Other (Comment)   Hurrycane   Gait Pattern  Step-through pattern;Lateral hip instability;Wide base of support;Decreased arm swing - right;Decreased weight shift to right    Ambulation Surface  Level;Indoor    Gait velocity  2.34 ft/sec using his cane indicative of limited community ambulator    Stairs  Yes    Stairs Assistance  5: Supervision    Stairs Assistance Details (indicate cue type and reason)  Pt performs initial trial using B HR's and reciprocal stepping progressing to 4 steps with single HR and reciprocal stepping technique    Stair Management Technique  Two rails;Alternating  pattern;One rail Right    Number of Stairs  4   x2 trials   Height of Stairs  6      Standardized  Balance Assessment   Standardized Balance Assessment  Berg Balance Test;10 meter walk test    10 Meter Walk  2.34 ft/sec using his cane indicative of limited community ambulator       Berg Balance Test   Sit to Stand  Able to stand without using hands and stabilize independently    Standing Unsupported  Able to stand 2 minutes with supervision    Sitting with Back Unsupported but Feet Supported on Floor or Stool  Able to sit safely and securely 2 minutes    Stand to Sit  Sits safely with minimal use of hands    Transfers  Able to transfer safely, definite need of hands    Standing Unsupported with Eyes Closed  Able to stand 10 seconds with supervision    Standing Ubsupported with Feet Together  Able to place feet together independently and stand for 1 minute with supervision    From Standing, Reach Forward with Outstretched Arm  Can reach confidently >25 cm (10")    From Standing Position, Pick up Object from Floor  Able to pick up shoe, needs supervision    From Standing Position, Turn to Look Behind Over each Shoulder  Looks behind one side only/other side shows less weight shift    Turn 360 Degrees  Needs close supervision or verbal cueing    Standing Unsupported, Alternately Place Feet on Step/Stool  Able to complete >2 steps/needs minimal assist    Standing Unsupported, One Foot in [employer] balance while stepping or standing    Standing on One Leg  Able to lift leg independently and hold equal to or more than 3 seconds    Total Score  38    Berg comment:  38/56 indicating significant fall risk                 Objective measurements completed on examination: See above findings.              PT Education - 03/28/18 1258    Education provided  Yes    Education Details  Therapist provided education regarding clinical findings of inital evaluation, benefits of  aquatic therapy and POC moving forward with therapy.    Person(s) Educated  Patient;Parent(s)   mother   Methods  Explanation    Comprehension  Verbalized understanding       PT Short Term Goals - 03/28/18 1300      PT SHORT TERM GOAL #1   Title  Pt will participate in establishment of initial HEP to improve balance and safety with functional mobility    Time  4    Period  Weeks    Status  New    Target Date  04/27/18      PT SHORT TERM GOAL #2   Title  Pt will improve Berg score to >/=42/56 indicating reduction in fall risk potential and improvement with balance     Baseline  11/1: 38/56 indicative of significant fall risk potential    Time  4    Period  Weeks    Status  New    Target Date  04/27/18      PT SHORT TERM GOAL #3   Title  Pt will improve gait speed to >/=2.64 ft/sec with his cane and R AFO demonstrating improvement in functional mobility    Baseline  11/1; 2.34 ft/sec using his cane indicative of limited community ambulator     Time  4    Period  Weeks  Status  New    Target Date  04/27/18      PT SHORT TERM GOAL #4   Title  Pt will demonstrate improved LE strength as indicated by decrease in five time sit to stand < or = 13 seconds with no UE assist from a standard height chair    Baseline  11/1: 17.28 seconds from standard height chair usingno UE assist indicative of high fall risk and LE power deficits      Time  4    Period  Weeks    Status  New    Target Date  04/27/18      PT SHORT TERM GOAL #5   Title  Pt will ambulate 230 feet indoors with his cane and R AFO on level surfaces and therapist providing supervision indicating improvement in functional mobility     Baseline  11/1: Pt reports having to take multiple standing rest breaks during ambulation trials 2/2 to poor endurance    Time  4    Period  Weeks    Status  New    Target Date  04/27/18        PT Long Term Goals - 03/28/18 1310      PT LONG TERM GOAL #1   Title  Pt will be  independent with his HEP to improve balance and functional mobility     Time  8    Period  Weeks    Status  New    Target Date  05/27/18      PT LONG TERM GOAL #2   Title  Pt will ambulate 300' outside on pavement and negotiate curb with supervision with AFO and cane to improve safety with community distances    Time  8    Period  Weeks    Status  New    Target Date  05/27/18      PT LONG TERM GOAL #3   Title  Pt will improve Berg score to >/= 46/56 indicating reduction in fall risk potential     Time  8    Period  Weeks    Status  New    Target Date  05/27/18      PT LONG TERM GOAL #4   Title  Pt will improve gait speed with his cane and R AFO to >/= 3.0 ft/sec indicative of improvement in functional mobility     Time  8    Period  Weeks    Status  New    Target Date  05/27/18             Plan - 03/28/18 1316    Clinical Impression Statement  Pt is a 47 year old male referred to Neuro OPPT for evaluation of functional mobility with MS dx. Pt's PMH is significant for the following: MS, Type 1 DM, HLD, HTN, Hypogonadism in male, Hypothyroidism, Proliferative Diabetic Retinopathy, and Vitamin D Insufficiency. The following deficits were noted during pt's exam: Patient demonstrates 4/5 strength to bilateral LE's when grossly assessed in sitting via MMT with exception to R hip flexor being 4-/.5 Pt's Berg Score of 38/56 places him in the category of significant fall risk potential. His 5x STS time of 17.28 seconds is indicative of high fall risk potential with LE power deficits. Pt's gait speed of 2.34 ft/sec with his cane and R AFO is indicative of limited community ambulator. Pt would benefit from skilled PT to address these impairments and functional limitations to maximize functional mobility independence  and reduce falls risk.     History and Personal Factors relevant to plan of care:   MS, Type 1 DM, HLD, HTN, Hypogonadism in male, Hypothyroidism, Proliferative Diabetic  Retinopathy, Vitamin D Insufficiency. Pt reports having to take frequent standing rest breaks during community ambulation 2/2 to quick onset of fatigue. Pt reports he is able to perform all hobbies and ADL's it just takes increased time for task performance.     Clinical Presentation  Evolving    Clinical Presentation due to:   MS, Type 1 DM, HLD, HTN, Hypogonadism in male, Hypothyroidism, Proliferative Diabetic Retinopathy, Vitamin D Insufficiency. Pt reports having to take frequent standing rest breaks during community ambulation 2/2 to quick onset of fatigue. Pt reports he is able to perform all hobbies and ADL's it just takes increased time for task performance    Clinical Decision Making  Moderate    Rehab Potential  Good    Clinical Impairments Affecting Rehab Potential  Progressive nature of MS dx    PT Frequency  2x / week    PT Duration  8 weeks    PT Treatment/Interventions  ADLs/Self Care Home Management;Gait training;Stair training;Functional mobility training;Therapeutic activities;Therapeutic exercise;Balance training;Neuromuscular re-education;Passive range of motion;Manual techniques;Patient/family education;Electrical Stimulation;DME Instruction;Energy conservation    PT Next Visit Plan  balance, core strengthening, proximal hip strengthening and stability, initiate HEP, follow up regarding patient's interest in aquatic    PT Home Exercise Plan  tbd    Consulted and Agree with Plan of Care  Patient;Family member/caregiver    Family Member Consulted  mother       Patient will benefit from skilled therapeutic intervention in order to improve the following deficits and impairments:  Abnormal gait, Decreased range of motion, Decreased balance, Decreased mobility, Difficulty walking, Decreased activity tolerance, Decreased strength, Decreased endurance, Impaired flexibility  Visit Diagnosis: Muscle weakness (generalized)  Unsteadiness on feet  Difficulty in walking, not elsewhere  classified     Problem List Patient Active Problem List   Diagnosis Date Noted  . Vitamin D insufficiency 11/29/2016  . Hypogonadism in male 11/01/2016  . Type 1 diabetes mellitus ([place])   . Hypothyroidism   . Hypertension   . Hyperlipidemia   . Closed fracture of right fibula and tibia 10/29/2016  . Closed tibia fracture 10/29/2016  . Closed fracture of right tibial plafond with fibula involvement 10/29/2016  . Abnormal liver function tests 03/06/2013  . RASH AND OTHER NONSPECIFIC SKIN ERUPTION 10/07/2009  . SHOULDER PAIN, LEFT 12/27/2008  . Type I (juvenile type) diabetes mellitus without mention of complication, uncontrolled 12/03/2007  . HYPOTHYROIDISM 11/12/2007  . HYPERCHOLESTEROLEMIA 11/12/2007  . MULTIPLE SCLEROSIS 08/29/2007  . PROLIFERATIVE DIABETIC RETINOPATHY 08/29/2007  . HYPERTENSION 12/25/2006    Floreen Comber, SPT 03/28/2018, 1:24 PM  [facility] [address] [place], [place], [address] Phone: [phone]   Fax:  [phone]  Name: Johnathan Ross MRN: [id number] Date of Birth: 1971/03/02

## 2018-03-31 ENCOUNTER — Encounter: Payer: Self-pay | Admitting: Physical Therapy

## 2018-03-31 ENCOUNTER — Other Ambulatory Visit (INDEPENDENT_AMBULATORY_CARE_PROVIDER_SITE_OTHER): Payer: Medicare Other

## 2018-03-31 ENCOUNTER — Ambulatory Visit: Payer: Medicare Other | Admitting: Physical Therapy

## 2018-03-31 DIAGNOSIS — R262 Difficulty in walking, not elsewhere classified: Secondary | ICD-10-CM | POA: Diagnosis not present

## 2018-03-31 DIAGNOSIS — E1065 Type 1 diabetes mellitus with hyperglycemia: Secondary | ICD-10-CM | POA: Diagnosis not present

## 2018-03-31 DIAGNOSIS — M6281 Muscle weakness (generalized): Secondary | ICD-10-CM | POA: Diagnosis not present

## 2018-03-31 DIAGNOSIS — R2681 Unsteadiness on feet: Secondary | ICD-10-CM

## 2018-03-31 DIAGNOSIS — E063 Autoimmune thyroiditis: Secondary | ICD-10-CM | POA: Diagnosis not present

## 2018-03-31 DIAGNOSIS — M21371 Foot drop, right foot: Secondary | ICD-10-CM | POA: Diagnosis not present

## 2018-03-31 LAB — LIPID PANEL
Cholesterol: 124 mg/dL (ref 0–200)
HDL: 40.7 mg/dL (ref >39.00)
LDL Cholesterol: 74 mg/dL (ref 0–99)
NonHDL: 82.89
Total CHOL/HDL Ratio: 3
Triglycerides: 44 mg/dL (ref 0.0–149.0)
VLDL: 8.8 mg/dL (ref 0.0–40.0)

## 2018-03-31 LAB — COMPREHENSIVE METABOLIC PANEL
ALT: 20 U/L (ref 0–53)
AST: 20 U/L (ref 0–37)
Albumin: 4.5 g/dL (ref 3.5–5.2)
Alkaline Phosphatase: 103 U/L (ref 39–117)
BUN: 13 mg/dL (ref 6–23)
CO2: 30 mEq/L (ref 19–32)
Calcium: 9.6 mg/dL (ref 8.4–10.5)
Chloride: 103 mEq/L (ref 96–112)
Creatinine, Ser: 1.16 mg/dL (ref 0.40–1.50)
GFR: 87.31 mL/min (ref >60.00)
Glucose, Bld: 196 mg/dL — ABNORMAL HIGH (ref 70–99)
Potassium: 4.2 mEq/L (ref 3.5–5.1)
Sodium: 139 mEq/L (ref 135–145)
Total Bilirubin: 0.6 mg/dL (ref 0.2–1.2)
Total Protein: 7.1 g/dL (ref 6.0–8.3)

## 2018-03-31 LAB — T4, FREE: Free T4: 1.36 ng/dL (ref 0.60–1.60)

## 2018-03-31 LAB — HEMOGLOBIN A1C: Hgb A1c MFr Bld: 8.1 % — ABNORMAL HIGH (ref 4.6–6.5)

## 2018-03-31 LAB — TSH: TSH: 0.03 u[IU]/mL — ABNORMAL LOW (ref 0.35–4.50)

## 2018-03-31 NOTE — Therapy (Signed)
[facility] [address] [place], [place], [address] Phone: [phone]   Fax:  [phone]  Physical Therapy Treatment  Patient Details  Name: Johnathan Ross MRN: [id number] Date of Birth: [birth year] Referring Provider (PT): Dr. Hyman Bower   Encounter Date: 03/31/2018  PT End of Session - 03/31/18 1310    Visit Number  2    Number of Visits  17    Date for PT Re-Evaluation  05/27/18    Authorization Type  Medicare Part A and B- * Requires 10th visit PN    PT Start Time  0847    PT Stop Time  0936    PT Time Calculation (min)  49 min    Equipment Utilized During Treatment  Gait belt    Activity Tolerance  Patient tolerated treatment well    Behavior During Therapy  [facility] for tasks assessed/performed;Flat affect       Past Medical History:  Diagnosis Date  . Hyperlipidemia   . Hypertension   . Hypogonadism in male 11/01/2016  . Hypothyroidism   . Multiple sclerosis ([place])   . 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 Type 1 diabetes mellitus ([place]) dx'd 1994  . Vitamin D insufficiency 11/29/2016    Past Surgical History:  Procedure Laterality Date  . EYE SURGERY Bilateral    "laser for diabetic retinopathy"  . FRACTURE SURGERY    . OPEN REDUCTION INTERNAL FIXATION (ORIF) TIBIA/FIBULA FRACTURE Right 10/30/2016  . ORIF ANKLE FRACTURE Left 2015  . ORIF TIBIA FRACTURE Right 10/30/2016   Procedure: OPEN REDUCTION INTERNAL FIXATION (ORIF) TIBIA FIBULA FRACTURE;  Surgeon: Altamese Remerton, MD;  Location: [facility];  Service: Orthopedics;  Laterality: Right;  . RETINAL LASER PROCEDURE Bilateral    "for diabetic retinopathy"    There were no vitals filed for this visit.  Subjective Assessment - 03/31/18 0851    Subjective  Pt reports working on exercises daily.    Patient is accompained by:  Family member    Pertinent History  MS (dx 6 years prior), Type 1 DM, HLD, HTN, Hypogonadism in male, Hypothyroidism,  Proliferative Diabetic Retinopathy, Vitamin D Insufficiency     Limitations  Lifting;Standing;Walking    How long can you sit comfortably?  unlimited    How long can you walk comfortably?  takes frequent standing rest breaks during gait     Patient Stated Goals  Walk without an AD and improve his balance to increase safety with functional mobility     Currently in Pain?  No/denies                       [facility] Adult PT Treatment/Exercise - 03/31/18 0001      Ambulation/Gait   Ambulation/Gait  Yes    Ambulation/Gait Assistance  5: Supervision;4: Min guard   intermittent light HHA   Ambulation/Gait Assistance Details  light HHA giving pt the option of more support for balance so that pt could attempt to not stop as frequently due to fatigue of R ankle and it dragging after a few steps.                                                   Ambulation Distance (Feet)  115 Feet   x2   Assistive device  Other (Comment)   Hurrycane and  intermittent light UE support.   Gait Pattern  Step-through pattern;Lateral hip instability;Wide base of support;Decreased arm swing - right;Decreased weight shift to right    Ambulation Surface  Level;Indoor    Pre-Gait Activities  practised walking in the parallel bars attempting to decreased UE support                                     Balance Exercises - 03/31/18 0854      Balance Exercises: Standing   Standing Eyes Opened  Wide (BOA);Head turns;Other reps (comment);Foam/compliant surface   + weight shifts lateral and A/P+ upper trunk rotations with bilateral UE diagonal movement holding red weighted ball.   Stepping Strategy  Anterior   with weight shifts forward, progressiing with decreased UE support   Sidestepping  Upper extremity support;5 reps    Marching Limitations  Attempting to decrease UE support        PT Education - 03/31/18 1309    Education provided  Yes    Education Details  Encouraged pt to use RW as a tool that will  allow him to walk for execise working on strengthening R ankle and proper gait mechanics.    Person(s) Educated  Patient    Methods  Explanation    Comprehension  Verbalized understanding       PT Short Term Goals - 03/28/18 1300      PT SHORT TERM GOAL #1   Title  Pt will participate in establishment of initial HEP to improve balance and safety with functional mobility    Time  4    Period  Weeks    Status  New    Target Date  04/27/18      PT SHORT TERM GOAL #2   Title  Pt will improve Berg score to >/=42/56 indicating reduction in fall risk potential and improvement with balance     Baseline  11/1: 38/56 indicative of significant fall risk potential    Time  4    Period  Weeks    Status  New    Target Date  04/27/18      PT SHORT TERM GOAL #3   Title  Pt will improve gait speed to >/=2.64 ft/sec with his cane and R AFO demonstrating improvement in functional mobility    Baseline  11/1; 2.34 ft/sec using his cane indicative of limited community ambulator     Time  4    Period  Weeks    Status  New    Target Date  04/27/18      PT SHORT TERM GOAL #4   Title  Pt will demonstrate improved LE strength as indicated by decrease in five time sit to stand < or = 13 seconds with no UE assist from a standard height chair    Baseline  11/1: 17.28 seconds from standard height chair usingno UE assist indicative of high fall risk and LE power deficits      Time  4    Period  Weeks    Status  New    Target Date  04/27/18      PT SHORT TERM GOAL #5   Title  Pt will ambulate 230 feet indoors with his cane and R AFO on level surfaces and therapist providing supervision indicating improvement in functional mobility     Baseline  11/1: Pt reports having to take multiple standing rest breaks during ambulation  trials 2/2 to poor endurance    Time  4    Period  Weeks    Status  New    Target Date  04/27/18        PT Long Term Goals - 03/28/18 1310      PT LONG TERM GOAL #1   Title   Pt will be independent with his HEP to improve balance and functional mobility     Time  8    Period  Weeks    Status  New    Target Date  05/27/18      PT LONG TERM GOAL #2   Title  Pt will ambulate 300' outside on pavement and negotiate curb with supervision with AFO and cane to improve safety with community distances    Time  8    Period  Weeks    Status  New    Target Date  05/27/18      PT LONG TERM GOAL #3   Title  Pt will improve Berg score to >/= 46/56 indicating reduction in fall risk potential     Time  8    Period  Weeks    Status  New    Target Date  05/27/18      PT LONG TERM GOAL #4   Title  Pt will improve gait speed with his cane and R AFO to >/= 3.0 ft/sec indicative of improvement in functional mobility     Time  8    Period  Weeks    Status  New    Target Date  05/27/18            Plan - 03/31/18 1311    Clinical Impression Statement  Skilled session focused on standing balance and core strengthening on compliant surface, dynamic standing and stepping attempting decreased UE support in parallel bars, and continued gait training instructing on mechanics with SPC.  Pt requires intermittent UE support for balance training and continues to demonstrated rapid fatigue in R ankle during gait; pt having to stop every couple of feet to rest R foot to avoid foot drag.                                                                                    Rehab Potential  Good    Clinical Impairments Affecting Rehab Potential  Progressive nature of MS dx    PT Frequency  2x / week    PT Duration  8 weeks    PT Treatment/Interventions  ADLs/Self Care Home Management;Gait training;Stair training;Functional mobility training;Therapeutic activities;Therapeutic exercise;Balance training;Neuromuscular re-education;Passive range of motion;Manual techniques;Patient/family education;Electrical Stimulation;DME Instruction;Energy conservation    PT Next Visit Plan  Possibly trial  more supportive AFO?? balance, core strengthening, proximal hip strengthening and stability, initiate HEP, follow up regarding patient's interest in aquatic    PT Home Exercise Plan  tbd    Consulted and Agree with Plan of Care  Patient;Family member/caregiver    Family Member Consulted  mother       Patient will benefit from skilled therapeutic intervention in order to improve the following deficits and impairments:  Abnormal gait, Decreased range of motion, Decreased balance,  Decreased mobility, Difficulty walking, Decreased activity tolerance, Decreased strength, Decreased endurance, Impaired flexibility  Visit Diagnosis: Muscle weakness (generalized)  Unsteadiness on feet  Difficulty in walking, not elsewhere classified     Problem List Patient Active Problem List   Diagnosis Date Noted  . Vitamin D insufficiency 11/29/2016  . Hypogonadism in male 11/01/2016  . Type 1 diabetes mellitus ([place])   . Hypothyroidism   . Hypertension   . Hyperlipidemia   . Closed fracture of right fibula and tibia 10/29/2016  . Closed tibia fracture 10/29/2016  . Closed fracture of right tibial plafond with fibula involvement 10/29/2016  . Abnormal liver function tests 03/06/2013  . RASH AND OTHER NONSPECIFIC SKIN ERUPTION 10/07/2009  . SHOULDER PAIN, LEFT 12/27/2008  . Type I (juvenile type) diabetes mellitus without mention of complication, uncontrolled 12/03/2007  . HYPOTHYROIDISM 11/12/2007  . HYPERCHOLESTEROLEMIA 11/12/2007  . MULTIPLE SCLEROSIS 08/29/2007  . PROLIFERATIVE DIABETIC RETINOPATHY 08/29/2007  . HYPERTENSION 12/25/2006    Bjorn Loser, PTA  03/31/18, 1:20 PM [facility] [address] [place], [place], [address] Phone: [phone]   Fax:  [phone]  Name: HATTIE AGUINALDO MRN: [id number] Date of Birth: 22-May-1971

## 2018-04-02 ENCOUNTER — Ambulatory Visit (INDEPENDENT_AMBULATORY_CARE_PROVIDER_SITE_OTHER): Payer: Medicare Other | Admitting: Endocrinology

## 2018-04-02 ENCOUNTER — Encounter: Payer: Self-pay | Admitting: Endocrinology

## 2018-04-02 VITALS — BP 128/82 | HR 108 | Ht 73.0 in | Wt 207.2 lb

## 2018-04-02 DIAGNOSIS — I10 Essential (primary) hypertension: Secondary | ICD-10-CM

## 2018-04-02 DIAGNOSIS — E063 Autoimmune thyroiditis: Secondary | ICD-10-CM | POA: Diagnosis not present

## 2018-04-02 DIAGNOSIS — E1065 Type 1 diabetes mellitus with hyperglycemia: Secondary | ICD-10-CM

## 2018-04-02 DIAGNOSIS — E7800 Pure hypercholesterolemia, unspecified: Secondary | ICD-10-CM

## 2018-04-02 DIAGNOSIS — Z23 Encounter for immunization: Secondary | ICD-10-CM

## 2018-04-02 NOTE — Patient Instructions (Signed)
Thyroid 6 days per week

## 2018-04-02 NOTE — Progress Notes (Signed)
Subjective:              Patient ID: Johnathan Ross, male   DOB: 1970/11/10, 47 y.o.   MRN: [id number]  Diabetes    Diagnosis: Type 1 diabetes mellitus, date of diagnosis: 1995.   Insulin Pump followup:   CURRENT brand:  Medtronic  630 G   HISTORY: An insulin pump has been in use since 03/13/11.   PUMP settings are basal rate:   midnight = 1.1, 4 AM = 1.15. 6 a.m.= 1.5. 9 AM = 1.4,11 a.m. = 1.8., 2 p.m. = 2.15.  6 p.m. = 1.9, 10 PM = 1.6  Carbohydrate to insulin ratio: 1: 8, at 11 a.m. = 1: 7, after 6 p.m. = 1:7, hyperglycemia correction factor: 1: 30,and after 10 p.m. 1:30, target 120, active insulin time 4 hours.   Recent history:  His A1c has improved slightly at 8.1, previously 8.4  Current blood sugar patterns Evaluated from freestyle Libre and pump download, management and problems identified:   CONTINUOUS GLUCOSE MONITORING RECORD INTERPRETATION for the last 2 weeks recording   Results statistics:   CGM use % of time  76  Average and SD  166+/-61  Time in range       55 %  % Time Above 180  41  % Time above 250  7   % Time Below target 4    Glycemic patterns summary: He has significant variability at all times but more so late in the evenings and the early morning hours Has tendency to low normal or low sugars periodically overnight and significantly high readings late morning  Hyperglycemic episodes these are occurring mostly in the morning hours with or without a meal and also periodically in the afternoon or evening again with or without a meal  Hypoglycemic episodes occurred sporadically overnight with the lowest blood sugar of 49 at 3 AM; another episode occurred at about 8 AM but otherwise only low normal readings at times has had one episode of low blood sugar following correction for a high reading in the early evening  Overnight periods: Blood sugars are variably high late at night at bedtime averaging about 180 but consistently getting lower until  about 4 AM and then overall rising slowly with only one documented hypoglycemia  Preprandial periods: Blood sugars are generally near normal at breakfast time, variably high at lunchtime and variably high at suppertime which is about 7-8 PM  Postprandial periods: After breakfast: Blood sugars may or may not be going up as his morning boluses have been inconsistent.  He says that he is getting 1 to 15 g of carbohydrate at breakfast and will not bolus if blood sugar is normal.  However for pancakes yesterday and with the carbohydrate intake of 134 g entered his blood sugar was still higher later in the morning After lunch: Blood sugars are only minimally higher, usually not rising significantly, however not always entering carbohydrates at lunchtime  After dinner: Blood sugars are variable and may be occasionally related to missed boluses and some high readings related to higher fat intake without doing a extended bolus   Treatment of hypoglycemia: He is usually drinking juice for low sugars and usually can detect symptoms of low sugars  Treatment of hyperglycemia: He usually is not overshooting with correction of high readings but also at times blood sugar may be persistently high At bedtime he will only take a smaller and calculated dose of bolus for fear of overnight hypoglycemia  PRE-MEAL Fasting Lunch Dinner Bedtime Overall  Glucose range:       Mean/median:  151  195  154   162   POST-MEAL PC Breakfast PC Lunch PC Dinner  Glucose range:     Mean/median:  176  187  189      Wt Readings from Last 3 Encounters:  04/02/18 207 lb 3.2 oz (94 kg)  12/25/17 209 lb 9.6 oz (95.1 kg)  12/23/17 209 lb 9.6 oz (95.1 kg)     Lab Results  Component Value Date   HGBA1C 8.1 (H) 03/31/2018   HGBA1C 8.4 (H) 10/23/2017   HGBA1C 8.0 (H) 07/23/2017   Lab Results  Component Value Date   MICROALBUR 1.1 12/23/2017   LDLCALC 74 03/31/2018   CREATININE 1.16 03/31/2018     OTHER active  problems discussed today: See review of systems    Allergies as of 04/02/2018   No Known Allergies     Medication List        Accurate as of 04/02/18 10:35 AM. Always use your most recent med list.          AMPYRA 10 MG Tb12 Generic drug:  dalfampridine Take 10 mg by mouth 2 (two) times daily. Take one tablet (10 mg) by mouth every morning and mid afternoon   diclofenac 75 MG EC tablet Commonly known as:  VOLTAREN Take 1 tablet (75 mg total) by mouth 2 (two) times daily.   glucose blood test strip USE AS INSTRUCTED TO CHECK BLOOD SUGARS 8 TIMES PER DAY DX CODE E10.65   insulin aspart 100 UNIT/ML injection Commonly known as:  novoLOG USE UP TO 80 UNITS IN INSULIN PUMP DAILY-DX CODE E10.8   insulin pump Soln Inject into the skin continuous. Novolog   levothyroxine 175 MCG tablet Commonly known as:  SYNTHROID, LEVOTHROID TAKE 1 TABLET (175 MCG TOTAL) BY MOUTH DAILY BEFORE BREAKFAST.   lisinopril 10 MG tablet Commonly known as:  PRINIVIL,ZESTRIL TAKE 1 TABLET BY MOUTH EVERY DAY   simvastatin 20 MG tablet Commonly known as:  ZOCOR TAKE 1 TABLET BY MOUTH AT BEDTIME       Allergies: No Known Allergies  Past Medical History:  Diagnosis Date  . Hyperlipidemia   . Hypertension   . Hypogonadism in male 11/01/2016  . Hypothyroidism   . Multiple sclerosis ([place])   . 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 Type 1 diabetes mellitus ([place]) dx'd 1994  . Vitamin D insufficiency 11/29/2016    Past Surgical History:  Procedure Laterality Date  . EYE SURGERY Bilateral    "laser for diabetic retinopathy"  . FRACTURE SURGERY    . OPEN REDUCTION INTERNAL FIXATION (ORIF) TIBIA/FIBULA FRACTURE Right 10/30/2016  . ORIF ANKLE FRACTURE Left 2015  . ORIF TIBIA FRACTURE Right 10/30/2016   Procedure: OPEN REDUCTION INTERNAL FIXATION (ORIF) TIBIA FIBULA FRACTURE;  Surgeon: Altamese [place], MD;  Location: [facility];  Service: Orthopedics;  Laterality: Right;  . RETINAL LASER PROCEDURE  Bilateral    "for diabetic retinopathy"    Family History  Problem Relation Age of Onset  . Diabetes Sister   . Colon cancer Maternal Grandmother   . Colon polyps Maternal Uncle     Social History:  reports that he has never smoked. He has never used smokeless tobacco. He reports that he does not drink alcohol or use drugs.    Review of Systems      HYPERCHOLESTEROLEMIA: He has been taking simvastatin 20 mg consistently now  LDL is below 70  Has no increase in ALT  Lab Results  Component Value Date   CHOL 124 03/31/2018   HDL 40.70 03/31/2018   LDLCALC 74 03/31/2018   LDLDIRECT 139.7 04/09/2014   TRIG 44.0 03/31/2018   CHOLHDL 3 03/31/2018   Lab Results  Component Value Date   ALT 20 03/31/2018    HYPERTENSION:  has  been on lisinopril with good control  of blood pressure Asking about pulsation in his ear sometimes   BP Readings from Last 3 Encounters:  04/02/18 128/82  12/25/17 140/80  12/23/17 (!) 142/78     He has MULTIPLE sclerosis, is followed in [place] by neurologist and not on treatment  HYPOTHYROIDISM: This is long-standing  His thyroid requirement has changed periodically and has been taking higher doses in 2018  Although TSH was previously stable it is now only 0.03, now taking levothyroxine prescription for 175 mcg daily Does not feel unusually shaky or having palpitations No recent weight change   Lab Results  Component Value Date   TSH 0.03 (L) 03/31/2018   TSH 1.12 12/23/2017   TSH 0.96 10/23/2017   FREET4 1.36 03/31/2018   FREET4 1.22 12/23/2017   FREET4 1.41 10/23/2017        Objective:   Physical Exam  BP 128/82   Pulse (!) 108   Ht 6\' 1"  (1.854 m)   Wt 207 lb 3.2 oz (94 kg)   SpO2 98%   BMI 27.34 kg/m        Repeat blood pressure 128/82  Assessment:      DIABETES type I on insulin pump:  See history of present illness for detailed discussion of current diabetes management, blood sugar patterns and  problems identified  He has persistently difficult to control diabetes with A1c 8.1 although slightly better  Blood sugars are labile and inconsistent with variability throughout the day  He appears to be needing lower basal rates overnight and higher basal rates in the morning hours and possibly late at night Also his bolusing for meals is not consistently appropriate as discussed above With his trying to suspend his pump occasionally at night for fear of hypoglycemia also blood sugars may be variable Lowest blood sugar was 44 at 3 AM and he is significantly concerned about low sugars overnight Most likely he is not bolusing enough in the evening and keeping his bedtime blood sugar relatively higher  HYPOTHYROIDISM:  His thyroid levels have been consistently normal but now TSH is suppressed and needs to go down to 150 mcg from 175  Lipids: Controlled  HYPERTENSION: His blood pressure is fairly well controlled      Plan:      Discussed day-to-day management and need to modify his diet, boluses and need for carbohydrate intake entry consistently with all meals To bolus appropriately for all meals as dictated by the pump bolus wizard Basal rate changes will be as follows 2 AM = 0.95, 4 AM = 1.05, 6 AM = 1.4 and 7 AM = 1.65 9 AM = 1.55 and 10 PM = 1.7 Sensitivity 1: 10:40 PM He will enter his carbohydrates for every meal and snack including breakfast To call if having hypoglycemia again Not to suspend pump at night  THYROID: He will take his 175 g 6 days a week until finished and then go to 150  LIPIDS: Well controlled  Counseling time on subjects discussed in assessment and plan sections is over 50% of today's 25 minute visit  Influenza vaccine given Follow-up in  3 months   Patient Instructions  Thyroid 6 days per week      Elayne Snare

## 2018-04-04 ENCOUNTER — Encounter: Payer: Self-pay | Admitting: Physical Therapy

## 2018-04-04 ENCOUNTER — Ambulatory Visit: Payer: Medicare Other | Admitting: Physical Therapy

## 2018-04-04 DIAGNOSIS — M6281 Muscle weakness (generalized): Secondary | ICD-10-CM

## 2018-04-04 DIAGNOSIS — M21371 Foot drop, right foot: Secondary | ICD-10-CM | POA: Diagnosis not present

## 2018-04-04 DIAGNOSIS — R2681 Unsteadiness on feet: Secondary | ICD-10-CM

## 2018-04-04 DIAGNOSIS — R262 Difficulty in walking, not elsewhere classified: Secondary | ICD-10-CM | POA: Diagnosis not present

## 2018-04-04 NOTE — Patient Instructions (Signed)
Access Code: [email or web address]  URL: [email or web address]  Date: 04/04/2018  Prepared by: Willow Ora   Exercises  Quadruped Hip Extension Kicks - 10 reps - 1 sets - 1x daily - 5x weekly  Quadruped Fire Hydrant - 10 reps - 1 sets - 1x daily - 5x weekly  Bird Dog - 10 reps - 1 sets - 1x daily - 5x weekly  Side Plank on Knees - 6 reps - 1 sets - 1x daily - 5x weekly  Tall Kneeling Chop - 10 reps - 1 sets - 1x daily - 5x weekly  Half-Kneeling to Standing - 3 reps - 1 sets - 10 hold - 1x daily - 5x weekly  Standing Alternating Knee Flexion - 10 reps - 2 sets - 1x daily - 5x weekly  Single Leg Stance with Support - 4 sets - 10 seconds hold - 1x daily - 5x weekly  Wall Quarter Squat - 3 reps - 10 second hold - 1x daily - 5x weekly  Single Leg Squat with Counter Support - 5 reps - 2 sets - 1x daily - 5x weekly  Standing Quarter Turn with Counter Support - 3 sets - 1x daily - 5x weekly  Marching Bridge - 5 reps - 2 sets - 1x daily - 5x weekly

## 2018-04-04 NOTE — Therapy (Signed)
[facility] [address] [place], [place], [address] Phone: [phone]   Fax:  [phone]  Physical Therapy Treatment  Patient Details  Name: Johnathan Ross MRN: [id number] Date of Birth: [birth year] Referring Provider (PT): Dr. Hyman Bower   Encounter Date: 04/04/2018  PT End of Session - 04/04/18 0852    Visit Number  3    Number of Visits  17    Date for PT Re-Evaluation  05/27/18    Authorization Type  Medicare Part A and B- * Requires 10th visit PN    PT Start Time  0849    PT Stop Time  0930    PT Time Calculation (min)  41 min    Equipment Utilized During Treatment  Gait belt    Activity Tolerance  Patient tolerated treatment well    Behavior During Therapy  [facility] for tasks assessed/performed;Flat affect       Past Medical History:  Diagnosis Date  . Hyperlipidemia   . Hypertension   . Hypogonadism in male 11/01/2016  . Hypothyroidism   . Multiple sclerosis ([place])   . 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 Type 1 diabetes mellitus ([place]) dx'd 1994  . Vitamin D insufficiency 11/29/2016    Past Surgical History:  Procedure Laterality Date  . EYE SURGERY Bilateral    "laser for diabetic retinopathy"  . FRACTURE SURGERY    . OPEN REDUCTION INTERNAL FIXATION (ORIF) TIBIA/FIBULA FRACTURE Right 10/30/2016  . ORIF ANKLE FRACTURE Left 2015  . ORIF TIBIA FRACTURE Right 10/30/2016   Procedure: OPEN REDUCTION INTERNAL FIXATION (ORIF) TIBIA FIBULA FRACTURE;  Surgeon: Altamese Amherst, MD;  Location: [facility];  Service: Orthopedics;  Laterality: Right;  . RETINAL LASER PROCEDURE Bilateral    "for diabetic retinopathy"    There were no vitals filed for this visit.  Subjective Assessment - 04/04/18 0851    Subjective  Still doing HEP from previous episode of care. Reports main issue in lifting right LE even with brace due to fatigue setting in.     Patient is accompained by:  Family member    Pertinent History   MS (dx 6 years prior), Type 1 DM, HLD, HTN, Hypogonadism in male, Hypothyroidism, Proliferative Diabetic Retinopathy, Vitamin D Insufficiency     Limitations  Lifting;Standing;Walking    How long can you sit comfortably?  unlimited    How long can you stand comfortably?  unlimited    How long can you walk comfortably?  takes frequent standing rest breaks during gait     Patient Stated Goals  Walk without an AD and improve his balance to increase safety with functional mobility     Currently in Pain?  No/denies    Pain Score  0-No pain          [facility] Adult PT Treatment/Exercise - 04/04/18 5956      Ambulation/Gait   Ambulation/Gait  Yes    Ambulation/Gait Assistance  5: Supervision    Ambulation/Gait Assistance Details  pt needed to stop 2 times for rest/recovery with each gait rep due to right LE fatigue    Ambulation Distance (Feet)  80 Feet   x2   Assistive device  Rolling walker   right AFO   Gait Pattern  Step-through pattern;Lateral hip instability;Wide base of support;Decreased arm swing - right;Decreased weight shift to right    Ambulation Surface  Level;Indoor      Neuro Re-ed    Neuro Re-ed Details   for balance/NMR: on airex  with light to no UE support- feet apart for alternating marching, then mini squats x 10 reps each, min guard to min assist for balance.       Exercises   Other Exercises   reviwed and revised HEP. refer to pt instructions for full details           PT Short Term Goals - 03/28/18 1300      PT SHORT TERM GOAL #1   Title  Pt will participate in establishment of initial HEP to improve balance and safety with functional mobility    Time  4    Period  Weeks    Status  New    Target Date  04/27/18      PT SHORT TERM GOAL #2   Title  Pt will improve Berg score to >/=42/56 indicating reduction in fall risk potential and improvement with balance     Baseline  11/1: 38/56 indicative of significant fall risk potential    Time  4    Period  Weeks     Status  New    Target Date  04/27/18      PT SHORT TERM GOAL #3   Title  Pt will improve gait speed to >/=2.64 ft/sec with his cane and R AFO demonstrating improvement in functional mobility    Baseline  11/1; 2.34 ft/sec using his cane indicative of limited community ambulator     Time  4    Period  Weeks    Status  New    Target Date  04/27/18      PT SHORT TERM GOAL #4   Title  Pt will demonstrate improved LE strength as indicated by decrease in five time sit to stand < or = 13 seconds with no UE assist from a standard height chair    Baseline  11/1: 17.28 seconds from standard height chair usingno UE assist indicative of high fall risk and LE power deficits      Time  4    Period  Weeks    Status  New    Target Date  04/27/18      PT SHORT TERM GOAL #5   Title  Pt will ambulate 230 feet indoors with his cane and R AFO on level surfaces and therapist providing supervision indicating improvement in functional mobility     Baseline  11/1: Pt reports having to take multiple standing rest breaks during ambulation trials 2/2 to poor endurance    Time  4    Period  Weeks    Status  New    Target Date  04/27/18        PT Long Term Goals - 03/28/18 1310      PT LONG TERM GOAL #1   Title  Pt will be independent with his HEP to improve balance and functional mobility     Time  8    Period  Weeks    Status  New    Target Date  05/27/18      PT LONG TERM GOAL #2   Title  Pt will ambulate 300' outside on pavement and negotiate curb with supervision with AFO and cane to improve safety with community distances    Time  8    Period  Weeks    Status  New    Target Date  05/27/18      PT LONG TERM GOAL #3   Title  Pt will improve Berg score to >/= 46/56 indicating reduction  in fall risk potential     Time  8    Period  Weeks    Status  New    Target Date  05/27/18      PT LONG TERM GOAL #4   Title  Pt will improve gait speed with his cane and R AFO to >/= 3.0 ft/sec  indicative of improvement in functional mobility     Time  8    Period  Weeks    Status  New    Target Date  05/27/18            Plan - 04/04/18 4944    Clinical Impression Statement  Today's skilled session focused on review and updating as indicated of pt's HEP from previous episode of care. Remainder of session continued to address gait and balance reactions. The pt is progressing toward goals and should benefit from continued PT to progress toward unmet goals.                          Rehab Potential  Good    Clinical Impairments Affecting Rehab Potential  Progressive nature of MS dx    PT Frequency  2x / week    PT Duration  8 weeks    PT Treatment/Interventions  ADLs/Self Care Home Management;Gait training;Stair training;Functional mobility training;Therapeutic activities;Therapeutic exercise;Balance training;Neuromuscular re-education;Passive range of motion;Manual techniques;Patient/family education;Electrical Stimulation;DME Instruction;Energy conservation    PT Next Visit Plan  Possibly trial more supportive AFO?? balance, core strengthening, proximal hip strengthening and stability    PT Home Exercise Plan  Access Code: [email or web address]     Consulted and Agree with Plan of Care  Patient;Family member/caregiver    Family Member Consulted  mother       Patient will benefit from skilled therapeutic intervention in order to improve the following deficits and impairments:  Abnormal gait, Decreased range of motion, Decreased balance, Decreased mobility, Difficulty walking, Decreased activity tolerance, Decreased strength, Decreased endurance, Impaired flexibility  Visit Diagnosis: Muscle weakness (generalized)  Unsteadiness on feet  Difficulty in walking, not elsewhere classified  Foot drop, right     Problem List Patient Active Problem List   Diagnosis Date Noted  . Vitamin D insufficiency 11/29/2016  . Hypogonadism in male 11/01/2016  . Type 1 diabetes mellitus ([place])   .  Hypothyroidism   . Hypertension   . Hyperlipidemia   . Closed fracture of right fibula and tibia 10/29/2016  . Closed tibia fracture 10/29/2016  . Closed fracture of right tibial plafond with fibula involvement 10/29/2016  . Abnormal liver function tests 03/06/2013  . RASH AND OTHER NONSPECIFIC SKIN ERUPTION 10/07/2009  . SHOULDER PAIN, LEFT 12/27/2008  . Type I (juvenile type) diabetes mellitus without mention of complication, uncontrolled 12/03/2007  . HYPOTHYROIDISM 11/12/2007  . HYPERCHOLESTEROLEMIA 11/12/2007  . MULTIPLE SCLEROSIS 08/29/2007  . PROLIFERATIVE DIABETIC RETINOPATHY 08/29/2007  . HYPERTENSION 12/25/2006    Willow Ora, PTA, [facility] [address] [place], [place] [address] [phone] 04/04/18, 2:16 PM   Name: BARLOW HARRISON MRN: [id number] Date of Birth: [birth year]

## 2018-04-07 ENCOUNTER — Encounter: Payer: Self-pay | Admitting: Physical Therapy

## 2018-04-07 ENCOUNTER — Ambulatory Visit: Payer: Medicare Other | Admitting: Physical Therapy

## 2018-04-07 DIAGNOSIS — R2681 Unsteadiness on feet: Secondary | ICD-10-CM | POA: Diagnosis not present

## 2018-04-07 DIAGNOSIS — M6281 Muscle weakness (generalized): Secondary | ICD-10-CM | POA: Diagnosis not present

## 2018-04-07 DIAGNOSIS — M21371 Foot drop, right foot: Secondary | ICD-10-CM

## 2018-04-07 DIAGNOSIS — R262 Difficulty in walking, not elsewhere classified: Secondary | ICD-10-CM

## 2018-04-07 NOTE — Therapy (Signed)
[facility] [address] [place], [place], [address] Phone: [phone]   Fax:  [phone]  Physical Therapy Treatment  Patient Details  Name: Johnathan Ross MRN: [id number] Date of Birth: August 08, 1970 Referring Provider (PT): Dr. Hyman Bower   Encounter Date: 04/07/2018  PT End of Session - 04/07/18 1040    Visit Number  4    Number of Visits  17    Date for PT Re-Evaluation  05/27/18    Authorization Type  Medicare Part A and B- * Requires 10th visit PN    PT Start Time  0848    PT Stop Time  0932    PT Time Calculation (min)  44 min    Activity Tolerance  Patient tolerated treatment well    Behavior During Therapy  [facility] for tasks assessed/performed       Past Medical History:  Diagnosis Date  . Hyperlipidemia   . Hypertension   . Hypogonadism in male 11/01/2016  . Hypothyroidism   . Multiple sclerosis ([place])   . 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 Type 1 diabetes mellitus ([place]) dx'd 1994  . Vitamin D insufficiency 11/29/2016    Past Surgical History:  Procedure Laterality Date  . EYE SURGERY Bilateral    "laser for diabetic retinopathy"  . FRACTURE SURGERY    . OPEN REDUCTION INTERNAL FIXATION (ORIF) TIBIA/FIBULA FRACTURE Right 10/30/2016  . ORIF ANKLE FRACTURE Left 2015  . ORIF TIBIA FRACTURE Right 10/30/2016   Procedure: OPEN REDUCTION INTERNAL FIXATION (ORIF) TIBIA FIBULA FRACTURE;  Surgeon: Altamese [place], MD;  Location: [facility];  Service: Orthopedics;  Laterality: Right;  . RETINAL LASER PROCEDURE Bilateral    "for diabetic retinopathy"    There were no vitals filed for this visit.  Subjective Assessment - 04/07/18 0851    Subjective  RLE is weaker this morning; using RW.  R toe drag increased this morning.  No issues over the weekend.      Patient is accompained by:  Family member    Pertinent History  MS (dx 6 years prior), Type 1 DM, HLD, HTN, Hypogonadism in male, Hypothyroidism,  Proliferative Diabetic Retinopathy, Vitamin D Insufficiency     Limitations  Lifting;Standing;Walking    How long can you sit comfortably?  unlimited    How long can you stand comfortably?  unlimited    How long can you walk comfortably?  takes frequent standing rest breaks during gait     Patient Stated Goals  Walk without an AD and improve his balance to increase safety with functional mobility     Currently in Pain?  No/denies                       [facility] Adult PT Treatment/Exercise - 04/07/18 0944      Therapeutic Activites    Therapeutic Activities  Other Therapeutic Activities    ADL's  Continued to discuss the specifics of aquatic therapy.  Discussed set up of GAC, entry/exit, what to expect in a session, benefits of aquatic.  Pt to continue to consider.      Neuro Re-ed    Neuro Re-ed Details   Seated on physioball focus on trunk and pelvis dissociation and core activation during anterior/posterior pelvic tilts, lateral pelvic excursion to L and R for weight shifting of COG over BOS without use of UE.  Transitioned to maintaining upright trunk and COG over pelvis while performing alternating LE marching - required mod A to  maintain trunk control and balance.  Pt reporting upper shoulder "burning" due to increased tension.  Transitioned to maintaining balance and postural control while performing resisted dynamic UE movements with green theraband (UE extension with shoulder depression) bilat UE and unilateral UE but pt continued to demonstrate increased utilization of upper trap for stabilization.  Removed use of resistance band and performed resistance-free UE movements into bilat UE flexion <> horizontal ABD/ADD with cues to maintain head/neck in neutral and shoulders/scapula depression x 5 reps.  Added in trunk rotation with shoulder flexion to 90 to L and R x 5 reps with cues to maintain trunk and shoulders in flexion over pelvis.        Exercises   Exercises  Shoulder       Shoulder Exercises: Stretch   Other Shoulder Stretches  Seated pectoralis and upper trapezius stretch with therapist facilitating shoulder depression and retraction while pt performed neck flexion and side flexion x 30 seconds each side             PT Education - 04/07/18 1014    Education provided  Yes    Education Details  continued to discuss benefits of aquatic.  Decreasing use of upper trap for stability.    Person(s) Educated  Patient;Parent(s)    Methods  Explanation    Comprehension  Verbalized understanding       PT Short Term Goals - 03/28/18 1300      PT SHORT TERM GOAL #1   Title  Pt will participate in establishment of initial HEP to improve balance and safety with functional mobility    Time  4    Period  Weeks    Status  New    Target Date  04/27/18      PT SHORT TERM GOAL #2   Title  Pt will improve Berg score to >/=42/56 indicating reduction in fall risk potential and improvement with balance     Baseline  11/1: 38/56 indicative of significant fall risk potential    Time  4    Period  Weeks    Status  New    Target Date  04/27/18      PT SHORT TERM GOAL #3   Title  Pt will improve gait speed to >/=2.64 ft/sec with his cane and R AFO demonstrating improvement in functional mobility    Baseline  11/1; 2.34 ft/sec using his cane indicative of limited community ambulator     Time  4    Period  Weeks    Status  New    Target Date  04/27/18      PT SHORT TERM GOAL #4   Title  Pt will demonstrate improved LE strength as indicated by decrease in five time sit to stand < or = 13 seconds with no UE assist from a standard height chair    Baseline  11/1: 17.28 seconds from standard height chair usingno UE assist indicative of high fall risk and LE power deficits      Time  4    Period  Weeks    Status  New    Target Date  04/27/18      PT SHORT TERM GOAL #5   Title  Pt will ambulate 230 feet indoors with his cane and R AFO on level surfaces and  therapist providing supervision indicating improvement in functional mobility     Baseline  11/1: Pt reports having to take multiple standing rest breaks during ambulation trials 2/2 to poor  endurance    Time  4    Period  Weeks    Status  New    Target Date  04/27/18        PT Long Term Goals - 03/28/18 1310      PT LONG TERM GOAL #1   Title  Pt will be independent with his HEP to improve balance and functional mobility     Time  8    Period  Weeks    Status  New    Target Date  05/27/18      PT LONG TERM GOAL #2   Title  Pt will ambulate 300' outside on pavement and negotiate curb with supervision with AFO and cane to improve safety with community distances    Time  8    Period  Weeks    Status  New    Target Date  05/27/18      PT LONG TERM GOAL #3   Title  Pt will improve Berg score to >/= 46/56 indicating reduction in fall risk potential     Time  8    Period  Weeks    Status  New    Target Date  05/27/18      PT LONG TERM GOAL #4   Title  Pt will improve gait speed with his cane and R AFO to >/= 3.0 ft/sec indicative of improvement in functional mobility     Time  8    Period  Weeks    Status  New    Target Date  05/27/18            Plan - 04/07/18 1043    Clinical Impression Statement  Treatment session focused on core activation and proximal stability during UE and LE movement while decreasing dependence on upper trapezius and neck for stability.  Pt to continue to consider aquatic.  Will continue to address in order to progress towards LTG.    Rehab Potential  Good    Clinical Impairments Affecting Rehab Potential  Progressive nature of MS dx    PT Frequency  2x / week    PT Duration  8 weeks    PT Treatment/Interventions  ADLs/Self Care Home Management;Gait training;Stair training;Functional mobility training;Therapeutic activities;Therapeutic exercise;Balance training;Neuromuscular re-education;Passive range of motion;Manual techniques;Patient/family  education;Electrical Stimulation;DME Instruction;Energy conservation    PT Next Visit Plan  Talk to suzanne about a Tuesday pm for aquatic; postural control during UE and LE movement decreasing use of upper trap; balance, core strengthening, proximal hip strengthening and stability    PT Home Exercise Plan  Access Code: [email or web address]     Consulted and Agree with Plan of Care  Patient;Family member/caregiver    Family Member Consulted  mother       Patient will benefit from skilled therapeutic intervention in order to improve the following deficits and impairments:  Abnormal gait, Decreased range of motion, Decreased balance, Decreased mobility, Difficulty walking, Decreased activity tolerance, Decreased strength, Decreased endurance, Impaired flexibility  Visit Diagnosis: Muscle weakness (generalized)  Unsteadiness on feet  Difficulty in walking, not elsewhere classified  Foot drop, right     Problem List Patient Active Problem List   Diagnosis Date Noted  . Vitamin D insufficiency 11/29/2016  . Hypogonadism in male 11/01/2016  . Type 1 diabetes mellitus ([place])   . Hypothyroidism   . Hypertension   . Hyperlipidemia   . Closed fracture of right fibula and tibia 10/29/2016  . Closed tibia fracture 10/29/2016  . Closed fracture  of right tibial plafond with fibula involvement 10/29/2016  . Abnormal liver function tests 03/06/2013  . RASH AND OTHER NONSPECIFIC SKIN ERUPTION 10/07/2009  . SHOULDER PAIN, LEFT 12/27/2008  . Type I (juvenile type) diabetes mellitus without mention of complication, uncontrolled 12/03/2007  . HYPOTHYROIDISM 11/12/2007  . HYPERCHOLESTEROLEMIA 11/12/2007  . MULTIPLE SCLEROSIS 08/29/2007  . PROLIFERATIVE DIABETIC RETINOPATHY 08/29/2007  . HYPERTENSION 12/25/2006    [name], PT, DPT 04/07/18    10:51 AM    [facility] [address] [place], [place], [address] Phone: [phone]   Fax:   [phone]  Name: Johnathan Ross MRN: [id number] Date of Birth: Dec 12, 1970

## 2018-04-08 DIAGNOSIS — H18832 Recurrent erosion of cornea, left eye: Secondary | ICD-10-CM | POA: Diagnosis not present

## 2018-04-08 DIAGNOSIS — H1859 Other hereditary corneal dystrophies: Secondary | ICD-10-CM | POA: Diagnosis not present

## 2018-04-11 ENCOUNTER — Encounter: Payer: Self-pay | Admitting: Physical Therapy

## 2018-04-11 ENCOUNTER — Ambulatory Visit: Payer: Medicare Other | Admitting: Physical Therapy

## 2018-04-11 DIAGNOSIS — M21371 Foot drop, right foot: Secondary | ICD-10-CM

## 2018-04-11 DIAGNOSIS — R2681 Unsteadiness on feet: Secondary | ICD-10-CM

## 2018-04-11 DIAGNOSIS — M6281 Muscle weakness (generalized): Secondary | ICD-10-CM

## 2018-04-11 DIAGNOSIS — R262 Difficulty in walking, not elsewhere classified: Secondary | ICD-10-CM

## 2018-04-11 NOTE — Therapy (Signed)
[facility] [address] [place], [place], [address] Phone: [phone]   Fax:  [phone]  Physical Therapy Treatment  Patient Details  Name: Johnathan Ross MRN: [id number] Date of Birth: 05/07/1971 Referring Provider (PT): Dr. Hyman Bower   Encounter Date: 04/11/2018  PT End of Session - 04/11/18 1801    Visit Number  5    Number of Visits  17    Date for PT Re-Evaluation  05/27/18    Authorization Type  Medicare Part A and B- * Requires 10th visit PN    PT Start Time  0848    PT Stop Time  0935    PT Time Calculation (min)  47 min    Activity Tolerance  Patient tolerated treatment well    Behavior During Therapy  [facility] for tasks assessed/performed       Past Medical History:  Diagnosis Date  . Hyperlipidemia   . Hypertension   . Hypogonadism in male 11/01/2016  . Hypothyroidism   . Multiple sclerosis ([place])   . 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 Type 1 diabetes mellitus ([place]) dx'd 1994  . Vitamin D insufficiency 11/29/2016    Past Surgical History:  Procedure Laterality Date  . EYE SURGERY Bilateral    "laser for diabetic retinopathy"  . FRACTURE SURGERY    . OPEN REDUCTION INTERNAL FIXATION (ORIF) TIBIA/FIBULA FRACTURE Right 10/30/2016  . ORIF ANKLE FRACTURE Left 2015  . ORIF TIBIA FRACTURE Right 10/30/2016   Procedure: OPEN REDUCTION INTERNAL FIXATION (ORIF) TIBIA FIBULA FRACTURE;  Surgeon: Altamese Noxubee, MD;  Location: [facility];  Service: Orthopedics;  Laterality: Right;  . RETINAL LASER PROCEDURE Bilateral    "for diabetic retinopathy"    There were no vitals filed for this visit.  Subjective Assessment - 04/11/18 0851    Subjective  Continues to have R foot drag this morning.  Went outside and blew leaves the other day and the foot didn't drag.  Has been working on exercises at home.  Shoulders and neck sore today.      Patient is accompained by:  Family member    Pertinent History  MS  (dx 6 years prior), Type 1 DM, HLD, HTN, Hypogonadism in male, Hypothyroidism, Proliferative Diabetic Retinopathy, Vitamin D Insufficiency     Limitations  Lifting;Standing;Walking    How long can you sit comfortably?  unlimited    How long can you stand comfortably?  unlimited    How long can you walk comfortably?  takes frequent standing rest breaks during gait     Patient Stated Goals  Walk without an AD and improve his balance to increase safety with functional mobility     Currently in Pain?  No/denies                       [facility] Adult PT Treatment/Exercise - 04/11/18 1749      Therapeutic Activites    Therapeutic Activities  Other Therapeutic Activities    Other Therapeutic Activities  Pt continues to be discouraged about lack of progress and continued need for therapy.  Continued to educate pt on effects of MS and demyelination, affect on nerve conduction, fatigue, muscle strength and coordination of activation.  Discussed role of therapy and compensation vs.adaptation and goal of greater function and improved safety but also discussed the limitations of therapy to change progression of disease.  Also discussed role of energy conservation.        Lumbar Exercises: Seated  Other Seated Lumbar Exercises  Seated on BOSU ball; focused on isolated pelvic tilts activating from core instead of use of shoulder elevation.  Pt unable to isolate even with tactile cues.  Transitioned to having pt performing UE extension and shoulder depression (pulling down and back with blue theraband and then stabilizing hands down into mat); with UE stabilized into extension and scapular depression, pt able to isolate trunk and pelvic tilts.  Added in 10 reps alternating marching, alternating LAQ and alternating knee to chest for core strengthening      Lumbar Exercises: Supine   Bridge with March  Non-compliant;10 reps   with bilat UE flexion holding blue theraband tension     Knee/Hip  Exercises: Supine   Bridges  Strengthening;Both;1 set;10 reps    Bridges Limitations  holding 5lb weight in each hand performing bilat UE flexion during bridge with cues to maintain core activation.  Also performed bridge with marching while holding blue theraband in horizontal ABD during full flexion.  Cues to decrease cervical extension             PT Education - 04/11/18 1800    Education provided  Yes    Education Details  see TA; how to upgrade bridges for further core activation; difference between isolated mm strengthening and coordination of activation for ambulation + balance    Person(s) Educated  Patient;Parent(s)    Methods  Explanation;Demonstration    Comprehension  Verbalized understanding;Returned demonstration       PT Short Term Goals - 03/28/18 1300      PT SHORT TERM GOAL #1   Title  Pt will participate in establishment of initial HEP to improve balance and safety with functional mobility    Time  4    Period  Weeks    Status  New    Target Date  04/27/18      PT SHORT TERM GOAL #2   Title  Pt will improve Berg score to >/=42/56 indicating reduction in fall risk potential and improvement with balance     Baseline  11/1: 38/56 indicative of significant fall risk potential    Time  4    Period  Weeks    Status  New    Target Date  04/27/18      PT SHORT TERM GOAL #3   Title  Pt will improve gait speed to >/=2.64 ft/sec with his cane and R AFO demonstrating improvement in functional mobility    Baseline  11/1; 2.34 ft/sec using his cane indicative of limited community ambulator     Time  4    Period  Weeks    Status  New    Target Date  04/27/18      PT SHORT TERM GOAL #4   Title  Pt will demonstrate improved LE strength as indicated by decrease in five time sit to stand < or = 13 seconds with no UE assist from a standard height chair    Baseline  11/1: 17.28 seconds from standard height chair usingno UE assist indicative of high fall risk and LE power  deficits      Time  4    Period  Weeks    Status  New    Target Date  04/27/18      PT SHORT TERM GOAL #5   Title  Pt will ambulate 230 feet indoors with his cane and R AFO on level surfaces and therapist providing supervision indicating improvement in functional mobility  Baseline  11/1: Pt reports having to take multiple standing rest breaks during ambulation trials 2/2 to poor endurance    Time  4    Period  Weeks    Status  New    Target Date  04/27/18        PT Long Term Goals - 03/28/18 1310      PT LONG TERM GOAL #1   Title  Pt will be independent with his HEP to improve balance and functional mobility     Time  8    Period  Weeks    Status  New    Target Date  05/27/18      PT LONG TERM GOAL #2   Title  Pt will ambulate 300' outside on pavement and negotiate curb with supervision with AFO and cane to improve safety with community distances    Time  8    Period  Weeks    Status  New    Target Date  05/27/18      PT LONG TERM GOAL #3   Title  Pt will improve Berg score to >/= 46/56 indicating reduction in fall risk potential     Time  8    Period  Weeks    Status  New    Target Date  05/27/18      PT LONG TERM GOAL #4   Title  Pt will improve gait speed with his cane and R AFO to >/= 3.0 ft/sec indicative of improvement in functional mobility     Time  8    Period  Weeks    Status  New    Target Date  05/27/18            Plan - 04/11/18 1802    Clinical Impression Statement  Continued education with pt regarding impact of MS, typical presentation and role of therapy.  Provided pt with options for increasing difficulty of bridges and bridge with march by incorporating dynamic UE movements decreasing use of UE for stability.  Continued core strengthening on Bosu on mat for more stable base and decreased use of shoulder elevation for stability.  Will continue to address in order to progress towards LTG.    Rehab Potential  Good    Clinical Impairments  Affecting Rehab Potential  Progressive nature of MS dx    PT Frequency  2x / week    PT Duration  8 weeks    PT Treatment/Interventions  ADLs/Self Care Home Management;Gait training;Stair training;Functional mobility training;Therapeutic activities;Therapeutic exercise;Balance training;Neuromuscular re-education;Passive range of motion;Manual techniques;Patient/family education;Electrical Stimulation;DME Instruction;Energy conservation    PT Next Visit Plan  Talk to suzanne about a Tuesday pm for aquatic; Core exercises on BOSU on ground.  postural control during UE and LE movement decreasing use of upper trap; balance, proximal hip strengthening and stability    PT Home Exercise Plan  Access Code: [email or web address]     Consulted and Agree with Plan of Care  Patient;Family member/caregiver    Family Member Consulted  mother       Patient will benefit from skilled therapeutic intervention in order to improve the following deficits and impairments:  Abnormal gait, Decreased range of motion, Decreased balance, Decreased mobility, Difficulty walking, Decreased activity tolerance, Decreased strength, Decreased endurance, Impaired flexibility  Visit Diagnosis: Muscle weakness (generalized)  Unsteadiness on feet  Difficulty in walking, not elsewhere classified  Foot drop, right     Problem List Patient Active Problem List   Diagnosis Date Noted  .  Vitamin D insufficiency 11/29/2016  . Hypogonadism in male 11/01/2016  . Type 1 diabetes mellitus ([place])   . Hypothyroidism   . Hypertension   . Hyperlipidemia   . Closed fracture of right fibula and tibia 10/29/2016  . Closed tibia fracture 10/29/2016  . Closed fracture of right tibial plafond with fibula involvement 10/29/2016  . Abnormal liver function tests 03/06/2013  . RASH AND OTHER NONSPECIFIC SKIN ERUPTION 10/07/2009  . SHOULDER PAIN, LEFT 12/27/2008  . Type I (juvenile type) diabetes mellitus without mention of complication, uncontrolled  12/03/2007  . HYPOTHYROIDISM 11/12/2007  . HYPERCHOLESTEROLEMIA 11/12/2007  . MULTIPLE SCLEROSIS 08/29/2007  . PROLIFERATIVE DIABETIC RETINOPATHY 08/29/2007  . HYPERTENSION 12/25/2006    [name], PT, DPT 04/11/18    6:07 PM    [facility] [address] [place], [place], [address] Phone: [phone]   Fax:  [phone]  Name: Johnathan Ross MRN: [id number] Date of Birth: 07/12/1970

## 2018-04-14 ENCOUNTER — Encounter: Payer: Self-pay | Admitting: Physical Therapy

## 2018-04-14 ENCOUNTER — Ambulatory Visit: Payer: Medicare Other | Admitting: Physical Therapy

## 2018-04-14 DIAGNOSIS — R2681 Unsteadiness on feet: Secondary | ICD-10-CM

## 2018-04-14 DIAGNOSIS — M21371 Foot drop, right foot: Secondary | ICD-10-CM | POA: Diagnosis not present

## 2018-04-14 DIAGNOSIS — R262 Difficulty in walking, not elsewhere classified: Secondary | ICD-10-CM | POA: Diagnosis not present

## 2018-04-14 DIAGNOSIS — M6281 Muscle weakness (generalized): Secondary | ICD-10-CM

## 2018-04-14 NOTE — Therapy (Signed)
[facility] [address] [place], [place], [address] Phone: [phone]   Fax:  [phone]  Physical Therapy Treatment  Patient Details  Name: Johnathan Ross MRN: [id number] Date of Birth: Sep 19, 1970 Referring Provider (PT): Dr. Hyman Bower   Encounter Date: 04/14/2018  PT End of Session - 04/14/18 0958    Visit Number  6    Number of Visits  17    Date for PT Re-Evaluation  05/27/18    Authorization Type  Medicare Part A and B- * Requires 10th visit PN    PT Start Time  0847    PT Stop Time  0930    PT Time Calculation (min)  43 min    Equipment Utilized During Treatment  Gait belt    Activity Tolerance  Patient tolerated treatment well    Behavior During Therapy  [facility] for tasks assessed/performed       Past Medical History:  Diagnosis Date  . Hyperlipidemia   . Hypertension   . Hypogonadism in male 11/01/2016  . Hypothyroidism   . Multiple sclerosis ([place])   . 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 Type 1 diabetes mellitus ([place]) dx'd 1994  . Vitamin D insufficiency 11/29/2016    Past Surgical History:  Procedure Laterality Date  . EYE SURGERY Bilateral    "laser for diabetic retinopathy"  . FRACTURE SURGERY    . OPEN REDUCTION INTERNAL FIXATION (ORIF) TIBIA/FIBULA FRACTURE Right 10/30/2016  . ORIF ANKLE FRACTURE Left 2015  . ORIF TIBIA FRACTURE Right 10/30/2016   Procedure: OPEN REDUCTION INTERNAL FIXATION (ORIF) TIBIA FIBULA FRACTURE;  Surgeon: Altamese Shiloh, MD;  Location: [facility];  Service: Orthopedics;  Laterality: Right;  . RETINAL LASER PROCEDURE Bilateral    "for diabetic retinopathy"    There were no vitals filed for this visit.  Subjective Assessment - 04/14/18 0850    Subjective  No new complaints, no falls to report.     Patient is accompained by:  Family member    Pertinent History  MS (dx 6 years prior), Type 1 DM, HLD, HTN, Hypogonadism in male, Hypothyroidism, Proliferative  Diabetic Retinopathy, Vitamin D Insufficiency     Limitations  Lifting;Standing;Walking    How long can you sit comfortably?  unlimited    How long can you stand comfortably?  unlimited    How long can you walk comfortably?  takes frequent standing rest breaks during gait     Patient Stated Goals  Walk without an AD and improve his balance to increase safety with functional mobility     Currently in Pain?  No/denies            [facility] Adult PT Treatment/Exercise - 04/14/18 0933      High Level Balance   High Level Balance Activities  Side stepping;Marching forwards;Backward walking    High Level Balance Comments  In parallel bars: foward and backward walking, and side stepping on blue mat with 1 hand support. Min A to maintain balance and vebal cues to increase step length and RLE clearance. Shouler rows and extension while standing on airex pad with supervision and verbal cues for proper exercise technique and posture.       Exercises   Exercises  Shoulder;Lumbar;Knee/Hip    Other Exercises   Resisted back extensions with green Tband. 3 way hip in standing with green Tband around ankles. x10 reps each leg. Cues for posture and to avoid lateral lean with abduction and. Prone back extensions with hands on  head; prone alternating opposite leg and arm lifts with 3 sec hold; prone cobra push ups.       Lumbar Exercises: Seated   Other Seated Lumbar Exercises  Seated on physioball: alternating marches with UE stabilized on mat to assist. Verbal and tactile cues for upright posture. Horizonal abduction with green Tband to promote scapular retraction and upright posture.           PT Short Term Goals - 03/28/18 1300      PT SHORT TERM GOAL #1   Title  Pt will participate in establishment of initial HEP to improve balance and safety with functional mobility    Time  4    Period  Weeks    Status  New    Target Date  04/27/18      PT SHORT TERM GOAL #2   Title  Pt will improve Berg  score to >/=42/56 indicating reduction in fall risk potential and improvement with balance     Baseline  11/1: 38/56 indicative of significant fall risk potential    Time  4    Period  Weeks    Status  New    Target Date  04/27/18      PT SHORT TERM GOAL #3   Title  Pt will improve gait speed to >/=2.64 ft/sec with his cane and R AFO demonstrating improvement in functional mobility    Baseline  11/1; 2.34 ft/sec using his cane indicative of limited community ambulator     Time  4    Period  Weeks    Status  New    Target Date  04/27/18      PT SHORT TERM GOAL #4   Title  Pt will demonstrate improved LE strength as indicated by decrease in five time sit to stand < or = 13 seconds with no UE assist from a standard height chair    Baseline  11/1: 17.28 seconds from standard height chair usingno UE assist indicative of high fall risk and LE power deficits      Time  4    Period  Weeks    Status  New    Target Date  04/27/18      PT SHORT TERM GOAL #5   Title  Pt will ambulate 230 feet indoors with his cane and R AFO on level surfaces and therapist providing supervision indicating improvement in functional mobility     Baseline  11/1: Pt reports having to take multiple standing rest breaks during ambulation trials 2/2 to poor endurance    Time  4    Period  Weeks    Status  New    Target Date  04/27/18        PT Long Term Goals - 03/28/18 1310      PT LONG TERM GOAL #1   Title  Pt will be independent with his HEP to improve balance and functional mobility     Time  8    Period  Weeks    Status  New    Target Date  05/27/18      PT LONG TERM GOAL #2   Title  Pt will ambulate 300' outside on pavement and negotiate curb with supervision with AFO and cane to improve safety with community distances    Time  8    Period  Weeks    Status  New    Target Date  05/27/18      PT LONG TERM GOAL #3  Title  Pt will improve Berg score to >/= 46/56 indicating reduction in fall risk  potential     Time  8    Period  Weeks    Status  New    Target Date  05/27/18      PT LONG TERM GOAL #4   Title  Pt will improve gait speed with his cane and R AFO to >/= 3.0 ft/sec indicative of improvement in functional mobility     Time  8    Period  Weeks    Status  New    Target Date  05/27/18            Plan - 04/14/18 0959    Clinical Impression Statement  Today's session focued on core stabilization on physioball, lumbar strengthening emphasizing extension, LE strnegthening, and dynamic balance on compliant surfaces. Pt was able to tolerate added strengthening exercises without complication however pt fatgues easily and has most difficulty with hip strengthening. Pt would benefit from further PT in order to continue progressing toward established goals.      Rehab Potential  Good    Clinical Impairments Affecting Rehab Potential  Progressive nature of MS dx    PT Frequency  2x / week    PT Duration  8 weeks    PT Treatment/Interventions  ADLs/Self Care Home Management;Gait training;Stair training;Functional mobility training;Therapeutic activities;Therapeutic exercise;Balance training;Neuromuscular re-education;Passive range of motion;Manual techniques;Patient/family education;Electrical Stimulation;DME Instruction;Energy conservation    PT Next Visit Plan  Talk to suzanne about a Tuesday pm for aquatic- [name] did on 11/18[place] to follow up with Letta Moynahan on Tues 11/19; Core exercises on BOSU on ground.  postural control during UE and LE movement decreasing use of upper trap; balance, proximal hip strengthening and stability    PT Home Exercise Plan  Access Code: [email or web address]     Consulted and Agree with Plan of Care  Patient;Family member/caregiver    Family Member Consulted  mother       Patient will benefit from skilled therapeutic intervention in order to improve the following deficits and impairments:  Abnormal gait, Decreased range of motion, Decreased balance, Decreased  mobility, Difficulty walking, Decreased activity tolerance, Decreased strength, Decreased endurance, Impaired flexibility  Visit Diagnosis: Muscle weakness (generalized)  Unsteadiness on feet  Difficulty in walking, not elsewhere classified  Foot drop, right     Problem List Patient Active Problem List   Diagnosis Date Noted  . Vitamin D insufficiency 11/29/2016  . Hypogonadism in male 11/01/2016  . Type 1 diabetes mellitus ([place])   . Hypothyroidism   . Hypertension   . Hyperlipidemia   . Closed fracture of right fibula and tibia 10/29/2016  . Closed tibia fracture 10/29/2016  . Closed fracture of right tibial plafond with fibula involvement 10/29/2016  . Abnormal liver function tests 03/06/2013  . RASH AND OTHER NONSPECIFIC SKIN ERUPTION 10/07/2009  . SHOULDER PAIN, LEFT 12/27/2008  . Type I (juvenile type) diabetes mellitus without mention of complication, uncontrolled 12/03/2007  . HYPOTHYROIDISM 11/12/2007  . HYPERCHOLESTEROLEMIA 11/12/2007  . MULTIPLE SCLEROSIS 08/29/2007  . PROLIFERATIVE DIABETIC RETINOPATHY 08/29/2007  . HYPERTENSION 12/25/2006    Cecile Sheerer, SPTA 04/14/2018, 12:32 PM  [facility] [address] [place], [place], [address] Phone: [phone]   Fax:  [phone]  Name: Johnathan Ross MRN: [id number] Date of Birth: 05/02/1971

## 2018-04-16 ENCOUNTER — Ambulatory Visit: Payer: Medicare Other | Admitting: Physical Therapy

## 2018-04-16 ENCOUNTER — Encounter: Payer: Self-pay | Admitting: Physical Therapy

## 2018-04-16 DIAGNOSIS — R262 Difficulty in walking, not elsewhere classified: Secondary | ICD-10-CM | POA: Diagnosis not present

## 2018-04-16 DIAGNOSIS — M21371 Foot drop, right foot: Secondary | ICD-10-CM | POA: Diagnosis not present

## 2018-04-16 DIAGNOSIS — R2681 Unsteadiness on feet: Secondary | ICD-10-CM

## 2018-04-16 DIAGNOSIS — M6281 Muscle weakness (generalized): Secondary | ICD-10-CM

## 2018-04-16 NOTE — Therapy (Signed)
[facility] [address] [place], [place], [address] Phone: [phone]   Fax:  [phone]  Physical Therapy Treatment  Patient Details  Name: Johnathan Ross MRN: [id number] Date of Birth: [birth year] Referring Provider (PT): Dr. Hyman Bower   Encounter Date: 04/16/2018  PT End of Session - 04/16/18 1249    Visit Number  7    Number of Visits  17    Date for PT Re-Evaluation  05/27/18    Authorization Type  Medicare Part A and B- * Requires 10th visit PN    PT Start Time  0846    PT Stop Time  0930    PT Time Calculation (min)  44 min    Equipment Utilized During Treatment  Gait belt    Activity Tolerance  Patient tolerated treatment well    Behavior During Therapy  [facility] for tasks assessed/performed       Past Medical History:  Diagnosis Date  . Hyperlipidemia   . Hypertension   . Hypogonadism in male 11/01/2016  . Hypothyroidism   . Multiple sclerosis ([place])   . 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 Type 1 diabetes mellitus ([place]) dx'd 1994  . Vitamin D insufficiency 11/29/2016    Past Surgical History:  Procedure Laterality Date  . EYE SURGERY Bilateral    "laser for diabetic retinopathy"  . FRACTURE SURGERY    . OPEN REDUCTION INTERNAL FIXATION (ORIF) TIBIA/FIBULA FRACTURE Right 10/30/2016  . ORIF ANKLE FRACTURE Left 2015  . ORIF TIBIA FRACTURE Right 10/30/2016   Procedure: OPEN REDUCTION INTERNAL FIXATION (ORIF) TIBIA FIBULA FRACTURE;  Surgeon: Altamese Portola, MD;  Location: [facility];  Service: Orthopedics;  Laterality: Right;  . RETINAL LASER PROCEDURE Bilateral    "for diabetic retinopathy"    There were no vitals filed for this visit.  Subjective Assessment - 04/16/18 0852    Subjective  No new complaints, no falls to report. Still doing exercises at home and are going well.     Patient is accompained by:  Family member    Pertinent History  MS (dx 6 years prior), Type 1 DM, HLD, HTN,  Hypogonadism in male, Hypothyroidism, Proliferative Diabetic Retinopathy, Vitamin D Insufficiency     Limitations  Lifting;Standing;Walking    How long can you sit comfortably?  unlimited    How long can you stand comfortably?  unlimited    How long can you walk comfortably?  takes frequent standing rest breaks during gait     Patient Stated Goals  Walk without an AD and improve his balance to increase safety with functional mobility     Currently in Pain?  No/denies            [facility] Adult PT Treatment/Exercise - 04/16/18 0856      Exercises   Other Exercises   supine on mat: double leg to chest with several rest breaks in between reps; straight leg raise x 10 reps each leg. Verbal cues to engage core with posterior pelvic tilt.       Lumbar Exercises: Seated   Other Seated Lumbar Exercises  Seated on Bosu ball with platform under feet for support. SPTA performs rhythmic stabilization with 5 sec hold and perturbations with pt deomstrating good core control. Progressed to airex under feet performing trunk rotation with BUE extended and rows with red Tband and verbal and tactile cues to increase trunk rotation and decrease shoulder movement.  Lumbar Exercises: Quadruped   Other Quadruped Lumbar Exercises  Bird Dog with opposite elbow to chest. Pt required min guard to maintain balance in quadraped postion. Had increased difficulty with crunch.                PT Short Term Goals - 03/28/18 1300      PT SHORT TERM GOAL #1   Title  Pt will participate in establishment of initial HEP to improve balance and safety with functional mobility    Time  4    Period  Weeks    Status  New    Target Date  04/27/18      PT SHORT TERM GOAL #2   Title  Pt will improve Berg score to >/=42/56 indicating reduction in fall risk potential and improvement with balance     Baseline  11/1: 38/56 indicative of significant fall risk potential    Time  4    Period  Weeks    Status   New    Target Date  04/27/18      PT SHORT TERM GOAL #3   Title  Pt will improve gait speed to >/=2.64 ft/sec with his cane and R AFO demonstrating improvement in functional mobility    Baseline  11/1; 2.34 ft/sec using his cane indicative of limited community ambulator     Time  4    Period  Weeks    Status  New    Target Date  04/27/18      PT SHORT TERM GOAL #4   Title  Pt will demonstrate improved LE strength as indicated by decrease in five time sit to stand < or = 13 seconds with no UE assist from a standard height chair    Baseline  11/1: 17.28 seconds from standard height chair usingno UE assist indicative of high fall risk and LE power deficits      Time  4    Period  Weeks    Status  New    Target Date  04/27/18      PT SHORT TERM GOAL #5   Title  Pt will ambulate 230 feet indoors with his cane and R AFO on level surfaces and therapist providing supervision indicating improvement in functional mobility     Baseline  11/1: Pt reports having to take multiple standing rest breaks during ambulation trials 2/2 to poor endurance    Time  4    Period  Weeks    Status  New    Target Date  04/27/18        PT Long Term Goals - 03/28/18 1310      PT LONG TERM GOAL #1   Title  Pt will be independent with his HEP to improve balance and functional mobility     Time  8    Period  Weeks    Status  New    Target Date  05/27/18      PT LONG TERM GOAL #2   Title  Pt will ambulate 300' outside on pavement and negotiate curb with supervision with AFO and cane to improve safety with community distances    Time  8    Period  Weeks    Status  New    Target Date  05/27/18      PT LONG TERM GOAL #3   Title  Pt will improve Berg score to >/= 46/56 indicating reduction in fall risk potential     Time  8  Period  Weeks    Status  New    Target Date  05/27/18      PT LONG TERM GOAL #4   Title  Pt will improve gait speed with his cane and R AFO to >/= 3.0 ft/sec indicative of  improvement in functional mobility     Time  8    Period  Weeks    Status  New    Target Date  05/27/18            Plan - 04/16/18 1252    Clinical Impression Statement  Today's session focused on core stabilizaion in sitting and LE strengthening/balance. Pt continues to present with foward shoulders and slumped posture requiring max cues to maintain during exercises. Pt was able to tolerate core stabilization exercises better on bosu ball rather than physioball with no UE support needed. Pt feels discouraged about his inablity to do the activities he used to be able to do. Pt would benefit from further PT in order to progress toward established goals.     Rehab Potential  Good    Clinical Impairments Affecting Rehab Potential  Progressive nature of MS dx    PT Frequency  2x / week    PT Duration  8 weeks    PT Treatment/Interventions  ADLs/Self Care Home Management;Gait training;Stair training;Functional mobility training;Therapeutic activities;Therapeutic exercise;Balance training;Neuromuscular re-education;Passive range of motion;Manual techniques;Patient/family education;Electrical Stimulation;DME Instruction;Energy conservation    PT Next Visit Plan  Talk to suzanne about a Tuesday pm for aquatic; Core exercises on BOSU on ground.  postural control during UE and LE movement decreasing use of upper trap; balance, proximal hip strengthening and stability    PT Home Exercise Plan  Access Code: [email or web address]     Consulted and Agree with Plan of Care  Patient;Family member/caregiver    Family Member Consulted  mother       Patient will benefit from skilled therapeutic intervention in order to improve the following deficits and impairments:  Abnormal gait, Decreased range of motion, Decreased balance, Decreased mobility, Difficulty walking, Decreased activity tolerance, Decreased strength, Decreased endurance, Impaired flexibility  Visit Diagnosis: Muscle weakness (generalized)  Difficulty  in walking, not elsewhere classified  Unsteadiness on feet     Problem List Patient Active Problem List   Diagnosis Date Noted  . Vitamin D insufficiency 11/29/2016  . Hypogonadism in male 11/01/2016  . Type 1 diabetes mellitus ([place])   . Hypothyroidism   . Hypertension   . Hyperlipidemia   . Closed fracture of right fibula and tibia 10/29/2016  . Closed tibia fracture 10/29/2016  . Closed fracture of right tibial plafond with fibula involvement 10/29/2016  . Abnormal liver function tests 03/06/2013  . RASH AND OTHER NONSPECIFIC SKIN ERUPTION 10/07/2009  . SHOULDER PAIN, LEFT 12/27/2008  . Type I (juvenile type) diabetes mellitus without mention of complication, uncontrolled 12/03/2007  . HYPOTHYROIDISM 11/12/2007  . HYPERCHOLESTEROLEMIA 11/12/2007  . MULTIPLE SCLEROSIS 08/29/2007  . PROLIFERATIVE DIABETIC RETINOPATHY 08/29/2007  . HYPERTENSION 12/25/2006    Cecile Sheerer, SPTA 04/16/2018, 1:06 PM  [facility] [address] [place], [place], [address] Phone: [phone]   Fax:  [phone]  Name: Johnathan Ross MRN: [id number] Date of Birth: [birth year]

## 2018-04-20 ENCOUNTER — Other Ambulatory Visit: Payer: Self-pay | Admitting: Endocrinology

## 2018-04-21 ENCOUNTER — Other Ambulatory Visit: Payer: Self-pay

## 2018-04-21 ENCOUNTER — Telehealth: Payer: Self-pay | Admitting: Endocrinology

## 2018-04-21 ENCOUNTER — Encounter: Payer: Self-pay | Admitting: Physical Therapy

## 2018-04-21 ENCOUNTER — Ambulatory Visit: Payer: Medicare Other | Admitting: Physical Therapy

## 2018-04-21 DIAGNOSIS — M21371 Foot drop, right foot: Secondary | ICD-10-CM

## 2018-04-21 DIAGNOSIS — R2681 Unsteadiness on feet: Secondary | ICD-10-CM

## 2018-04-21 DIAGNOSIS — M6281 Muscle weakness (generalized): Secondary | ICD-10-CM

## 2018-04-21 DIAGNOSIS — R262 Difficulty in walking, not elsewhere classified: Secondary | ICD-10-CM

## 2018-04-21 MED ORDER — GLUCOSE BLOOD VI STRP: ORAL_STRIP | 3 refills | Status: DC

## 2018-04-21 MED ORDER — INSULIN ASPART 100 UNIT/ML ~~LOC~~ SOLN: SUBCUTANEOUS | 2 refills | Status: DC

## 2018-04-21 NOTE — Therapy (Signed)
[facility] [address] [place], [place], [address] Phone: [phone]   Fax:  [phone]  Physical Therapy Treatment  Patient Details  Name: Johnathan Ross MRN: [id number] Date of Birth: 04-12-1971 Referring Provider (PT): Dr. Hyman Bower   Encounter Date: 04/21/2018  PT End of Session - 04/21/18 1611    Visit Number  8    Number of Visits  17    Date for PT Re-Evaluation  05/27/18    Authorization Type  Medicare Part A and B- * Requires 10th visit PN    PT Start Time  1403    PT Stop Time  1453    PT Time Calculation (min)  50 min    Activity Tolerance  Patient tolerated treatment well    Behavior During Therapy  [facility] for tasks assessed/performed       Past Medical History:  Diagnosis Date  . Hyperlipidemia   . Hypertension   . Hypogonadism in male 11/01/2016  . Hypothyroidism   . Multiple sclerosis ([place])   . 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 Type 1 diabetes mellitus ([place]) dx'd 1994  . Vitamin D insufficiency 11/29/2016    Past Surgical History:  Procedure Laterality Date  . EYE SURGERY Bilateral    "laser for diabetic retinopathy"  . FRACTURE SURGERY    . OPEN REDUCTION INTERNAL FIXATION (ORIF) TIBIA/FIBULA FRACTURE Right 10/30/2016  . ORIF ANKLE FRACTURE Left 2015  . ORIF TIBIA FRACTURE Right 10/30/2016   Procedure: OPEN REDUCTION INTERNAL FIXATION (ORIF) TIBIA FIBULA FRACTURE;  Surgeon: Altamese Hercules, MD;  Location: [facility];  Service: Orthopedics;  Laterality: Right;  . RETINAL LASER PROCEDURE Bilateral    "for diabetic retinopathy"    There were no vitals filed for this visit.  Subjective Assessment - 04/21/18 1408    Subjective  No issues to report.  Was a little sore after last session but not lasting.      Patient is accompained by:  Family member    Pertinent History  MS (dx 6 years prior), Type 1 DM, HLD, HTN, Hypogonadism in male, Hypothyroidism, Proliferative Diabetic  Retinopathy, Vitamin D Insufficiency     Limitations  Lifting;Standing;Walking    How long can you sit comfortably?  unlimited    How long can you stand comfortably?  unlimited    How long can you walk comfortably?  takes frequent standing rest breaks during gait     Patient Stated Goals  Walk without an AD and improve his balance to increase safety with functional mobility     Currently in Pain?  No/denies                       [facility] Adult PT Treatment/Exercise - 04/21/18 1408      Neuro Re-ed    Neuro Re-ed Details   On floor seated on Bosu ball with knees in flexion, feet on floor performing pelvid tilts and holding core activation while releasing UE from one leg at a time and then bilat UE release with min-mod A to prevent falling backwards.  Performed controlled roll down > up using UE as little as possible.  With UE in extension behind pt cued pt to perform shoulder depression with alternating hip flexion, hip/knee extension > flexion and knee fall outs x 10 reps each with max tactile cues to sequence.   Followng long sitting transitioned to tall kneeling and performed lateral stepping in tall kneeling with focus on lateral weight  shift through pelvis and ribs (instead of leaning with head/shoulders) in order to [facility] and advance contralateral LE - min-mod A required for balance and postural control.  Performed along red mat x 2 reps      Lumbar Exercises: Seated   Other Seated Lumbar Exercises  Seated in long sitting (to simulate sitting with back against a wall) focused on core activation and pelvic tilts but pt demonstrates increased proximal hamstring shortening - pt to continue with stretching and practice against wall at home      Knee/Hip Exercises: Stretches   Active Hamstring Stretch  Both;3 reps;30 seconds    Active Hamstring Stretch Limitations  seated in long sitting on floor with belt while also focusing on active assist to perform anterior pelvic tilt and  more upright trunk             PT Education - 04/21/18 1610    Education provided  Yes    Education Details  hamstring stretches and core exercise seated on floor    Person(s) Educated  Patient    Methods  Explanation;Demonstration    Comprehension  Returned demonstration;Verbalized understanding       PT Short Term Goals - 03/28/18 1300      PT SHORT TERM GOAL #1   Title  Pt will participate in establishment of initial HEP to improve balance and safety with functional mobility    Time  4    Period  Weeks    Status  New    Target Date  04/27/18      PT SHORT TERM GOAL #2   Title  Pt will improve Berg score to >/=42/56 indicating reduction in fall risk potential and improvement with balance     Baseline  11/1: 38/56 indicative of significant fall risk potential    Time  4    Period  Weeks    Status  New    Target Date  04/27/18      PT SHORT TERM GOAL #3   Title  Pt will improve gait speed to >/=2.64 ft/sec with his cane and R AFO demonstrating improvement in functional mobility    Baseline  11/1; 2.34 ft/sec using his cane indicative of limited community ambulator     Time  4    Period  Weeks    Status  New    Target Date  04/27/18      PT SHORT TERM GOAL #4   Title  Pt will demonstrate improved LE strength as indicated by decrease in five time sit to stand < or = 13 seconds with no UE assist from a standard height chair    Baseline  11/1: 17.28 seconds from standard height chair usingno UE assist indicative of high fall risk and LE power deficits      Time  4    Period  Weeks    Status  New    Target Date  04/27/18      PT SHORT TERM GOAL #5   Title  Pt will ambulate 230 feet indoors with his cane and R AFO on level surfaces and therapist providing supervision indicating improvement in functional mobility     Baseline  11/1: Pt reports having to take multiple standing rest breaks during ambulation trials 2/2 to poor endurance    Time  4    Period  Weeks     Status  New    Target Date  04/27/18        PT Long  Term Goals - 03/28/18 1310      PT LONG TERM GOAL #1   Title  Pt will be independent with his HEP to improve balance and functional mobility     Time  8    Period  Weeks    Status  New    Target Date  05/27/18      PT LONG TERM GOAL #2   Title  Pt will ambulate 300' outside on pavement and negotiate curb with supervision with AFO and cane to improve safety with community distances    Time  8    Period  Weeks    Status  New    Target Date  05/27/18      PT LONG TERM GOAL #3   Title  Pt will improve Berg score to >/= 46/56 indicating reduction in fall risk potential     Time  8    Period  Weeks    Status  New    Target Date  05/27/18      PT LONG TERM GOAL #4   Title  Pt will improve gait speed with his cane and R AFO to >/= 3.0 ft/sec indicative of improvement in functional mobility     Time  8    Period  Weeks    Status  New    Target Date  05/27/18            Plan - 04/21/18 1611    Clinical Impression Statement  Continued to focus on postural control, proximal stability and core activation during seated exercises on the floor and seated on Bosu.  Pt demonstrated significant difficulty performing anterior pelvic tilts and maintaining core activation due to weakness and hamstring tightness.  Provided pt with hamstring stretch and active assist for anterior pelvic tilt.  Continues to demonstrate impaired postural control during tall kneeling - will continue to address in order to progress towards LTG.    Rehab Potential  Good    Clinical Impairments Affecting Rehab Potential  Progressive nature of MS dx    PT Frequency  2x / week    PT Duration  8 weeks    PT Treatment/Interventions  ADLs/Self Care Home Management;Gait training;Stair training;Functional mobility training;Therapeutic activities;Therapeutic exercise;Balance training;Neuromuscular re-education;Passive range of motion;Manual techniques;Patient/family  education;Electrical Stimulation;DME Instruction;Energy conservation    PT Next Visit Plan  Check STG!!  Talk to suzanne about a Tuesday pm for aquatic; postural control during UE and LE movement decreasing use of upper trap; balance, proximal hip strengthening and stability - tall kneeling weight shifting, side stepping, forwards/backwards advancement    PT Home Exercise Plan  Access Code: [email or web address]     Consulted and Agree with Plan of Care  Patient;Family member/caregiver    Family Member Consulted  mother       Patient will benefit from skilled therapeutic intervention in order to improve the following deficits and impairments:  Abnormal gait, Decreased range of motion, Decreased balance, Decreased mobility, Difficulty walking, Decreased activity tolerance, Decreased strength, Decreased endurance, Impaired flexibility  Visit Diagnosis: Difficulty in walking, not elsewhere classified  Muscle weakness (generalized)  Unsteadiness on feet  Foot drop, right     Problem List Patient Active Problem List   Diagnosis Date Noted  . Vitamin D insufficiency 11/29/2016  . Hypogonadism in male 11/01/2016  . Type 1 diabetes mellitus ([place])   . Hypothyroidism   . Hypertension   . Hyperlipidemia   . Closed fracture of right fibula and tibia 10/29/2016  . Closed  tibia fracture 10/29/2016  . Closed fracture of right tibial plafond with fibula involvement 10/29/2016  . Abnormal liver function tests 03/06/2013  . RASH AND OTHER NONSPECIFIC SKIN ERUPTION 10/07/2009  . SHOULDER PAIN, LEFT 12/27/2008  . Type I (juvenile type) diabetes mellitus without mention of complication, uncontrolled 12/03/2007  . HYPOTHYROIDISM 11/12/2007  . HYPERCHOLESTEROLEMIA 11/12/2007  . MULTIPLE SCLEROSIS 08/29/2007  . PROLIFERATIVE DIABETIC RETINOPATHY 08/29/2007  . HYPERTENSION 12/25/2006    [name], PT, DPT 04/21/18    4:17 PM    [facility] [address] [place], [place], [address] Phone: [phone]   Fax:  [phone]  Name: LARELL BANEY MRN: [id number] Date of Birth: June 23, 1970

## 2018-04-21 NOTE — Telephone Encounter (Signed)
Done

## 2018-04-21 NOTE — Telephone Encounter (Signed)
Patient is needing refills on Novolog. Patient stated he spoke with someone and they where sending to the [facility]. They have not received it. Please Advise, thanks

## 2018-04-21 NOTE — Progress Notes (Signed)
error 

## 2018-04-27 ENCOUNTER — Other Ambulatory Visit: Payer: Self-pay | Admitting: Endocrinology

## 2018-04-28 ENCOUNTER — Ambulatory Visit: Payer: Medicare Other | Attending: Urology | Admitting: Physical Therapy

## 2018-04-28 ENCOUNTER — Encounter: Payer: Self-pay | Admitting: Physical Therapy

## 2018-04-28 DIAGNOSIS — R2681 Unsteadiness on feet: Secondary | ICD-10-CM

## 2018-04-28 DIAGNOSIS — M6281 Muscle weakness (generalized): Secondary | ICD-10-CM | POA: Diagnosis not present

## 2018-04-28 DIAGNOSIS — M21371 Foot drop, right foot: Secondary | ICD-10-CM | POA: Insufficient documentation

## 2018-04-28 DIAGNOSIS — R262 Difficulty in walking, not elsewhere classified: Secondary | ICD-10-CM | POA: Diagnosis not present

## 2018-04-28 NOTE — Therapy (Signed)
[facility] [address] [place], [place], [address] Phone: [phone]   Fax:  [phone]  Physical Therapy Treatment  Patient Details  Name: Johnathan Ross MRN: [id number] Date of Birth: 06/18/1970 Referring Provider (PT): Dr. Hyman Bower   Encounter Date: 04/28/2018  PT End of Session - 04/28/18 1118    Visit Number  9    Number of Visits  17    Date for PT Re-Evaluation  05/27/18    Authorization Type  Medicare Part A and B- * Requires 10th visit PN    PT Start Time  0845    PT Stop Time  0930    PT Time Calculation (min)  45 min    Equipment Utilized During Treatment  Gait belt    Activity Tolerance  Patient tolerated treatment well;Patient limited by fatigue    Behavior During Therapy  [facility] for tasks assessed/performed       Past Medical History:  Diagnosis Date  . Hyperlipidemia   . Hypertension   . Hypogonadism in male 11/01/2016  . Hypothyroidism   . Multiple sclerosis ([place])   . 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 Type 1 diabetes mellitus ([place]) dx'd 1994  . Vitamin D insufficiency 11/29/2016    Past Surgical History:  Procedure Laterality Date  . EYE SURGERY Bilateral    "laser for diabetic retinopathy"  . FRACTURE SURGERY    . OPEN REDUCTION INTERNAL FIXATION (ORIF) TIBIA/FIBULA FRACTURE Right 10/30/2016  . ORIF ANKLE FRACTURE Left 2015  . ORIF TIBIA FRACTURE Right 10/30/2016   Procedure: OPEN REDUCTION INTERNAL FIXATION (ORIF) TIBIA FIBULA FRACTURE;  Surgeon: Altamese [place], MD;  Location: [facility];  Service: Orthopedics;  Laterality: Right;  . RETINAL LASER PROCEDURE Bilateral    "for diabetic retinopathy"    There were no vitals filed for this visit.  Subjective Assessment - 04/28/18 0850    Subjective  Reports feeling a little stiffness in legs today and some soreness in shoulders. States some days are better than others.    Patient is accompained by:  Family member    Pertinent  History  MS (dx 6 years prior), Type 1 DM, HLD, HTN, Hypogonadism in male, Hypothyroidism, Proliferative Diabetic Retinopathy, Vitamin D Insufficiency     Limitations  Lifting;Standing;Walking    How long can you sit comfortably?  unlimited    How long can you stand comfortably?  unlimited    How long can you walk comfortably?  takes frequent standing rest breaks during gait     Patient Stated Goals  Walk without an AD and improve his balance to increase safety with functional mobility     Currently in Pain?  No/denies         [facility] PT Assessment - 04/28/18 0903      Berg Balance Test   Sit to Stand  Able to stand  independently using hands    Standing Unsupported  Able to stand safely 2 minutes    Sitting with Back Unsupported but Feet Supported on Floor or Stool  Able to sit safely and securely 2 minutes    Stand to Sit  Controls descent by using hands    Transfers  Able to transfer safely, definite need of hands    Standing Unsupported with Eyes Closed  Able to stand 10 seconds with supervision    Standing Ubsupported with Feet Together  Able to place feet together independently and stand for 1 minute with supervision    From Standing,  Reach Forward with Outstretched Arm  Can reach confidently >25 cm (10")    From Standing Position, Pick up Object from Floor  Able to pick up shoe, needs supervision    From Standing Position, Turn to Look Behind Over each Shoulder  Looks behind from both sides and weight shifts well    Turn 360 Degrees  Needs assistance while turning    Standing Unsupported, Alternately Place Feet on Step/Stool  Needs assistance to keep from falling or unable to try    Standing Unsupported, One Foot in Front  Able to plae foot ahead of the other independently and hold 30 seconds    Standing on One Leg  Tries to lift leg/unable to hold 3 seconds but remains standing independently    Total Score  38        [facility] Adult PT Treatment/Exercise - 04/28/18 1126       Transfers   Five time sit to stand comments   13.47 sec from standard height chair with UE assit.          PT Short Term Goals - 04/28/18 0852      PT SHORT TERM GOAL #1   Title  Pt will participate in establishment of initial HEP to improve balance and safety with functional mobility    Baseline  04/28/18: met today     Time  --    Period  --    Status  Achieved      PT SHORT TERM GOAL #2   Title  Pt will improve Berg score to >/=42/56 indicating reduction in fall risk potential and improvement with balance     Baseline  04/28/18: 38/56    Time  --    Period  --    Status  Not Met      PT SHORT TERM GOAL #3   Title  Pt will improve gait speed to >/=2.64 ft/sec with his cane and R AFO demonstrating improvement in functional mobility    Baseline  11/1; 2.34 ft/sec using his cane indicative of limited community ambulator     Time  4    Period  Weeks    Status  On-going      PT SHORT TERM GOAL #4   Title  Pt will demonstrate improved LE strength as indicated by decrease in five time sit to stand < or = 13 seconds with no UE assist from a standard height chair.     Baseline  04/28/18: 13.47 from standard height chair using UE assist.     Time  --    Period  --    Status  Not Met      PT SHORT TERM GOAL #5   Title  Pt will ambulate 230 feet indoors with his cane and R AFO on level surfaces and therapist providing supervision indicating improvement in functional mobility     Baseline  11/1: Pt reports having to take multiple standing rest breaks during ambulation trials 2/2 to poor endurance    Time  4    Period  Weeks    Status  On-going             PT Long Term Goals - 03/28/18 1310      PT LONG TERM GOAL #1   Title  Pt will be independent with his HEP to improve balance and functional mobility     Time  8    Period  Weeks    Status  New  Target Date  05/27/18      PT LONG TERM GOAL #2   Title  Pt will ambulate 300' outside on pavement and negotiate curb with  supervision with AFO and cane to improve safety with community distances    Time  8    Period  Weeks    Status  New    Target Date  05/27/18      PT LONG TERM GOAL #3   Title  Pt will improve Berg score to >/= 46/56 indicating reduction in fall risk potential     Time  8    Period  Weeks    Status  New    Target Date  05/27/18      PT LONG TERM GOAL #4   Title  Pt will improve gait speed with his cane and R AFO to >/= 3.0 ft/sec indicative of improvement in functional mobility     Time  8    Period  Weeks    Status  New    Target Date  05/27/18            Plan - 04/28/18 1121    Clinical Impression Statement  Today's treatment focused on assessing STGs with pt meeting 1/3 checked today for HEP and not meeting 2/3 checked today for Berg balance and 5STS, however time and score improved. Pt complained of tightness and heaviness felt in B LE today and required multiple seated rest breaks during treatment. Pt would benefit from futher PT to continue progressing toward established goals.    Rehab Potential  Good    Clinical Impairments Affecting Rehab Potential  Progressive nature of MS dx    PT Frequency  2x / week    PT Duration  8 weeks    PT Treatment/Interventions  ADLs/Self Care Home Management;Gait training;Stair training;Functional mobility training;Therapeutic activities;Therapeutic exercise;Balance training;Neuromuscular re-education;Passive range of motion;Manual techniques;Patient/family education;Electrical Stimulation;DME Instruction;Energy conservation    PT Next Visit Plan  Check remaining STG!!  Talk to suzanne about a Tuesday pm for aquatic; postural control during UE and LE movement decreasing use of upper trap; balance, proximal hip strengthening and stability - tall kneeling weight shifting, side stepping, forwards/backwards advancement    PT Home Exercise Plan  Access Code: [email or web address]     Consulted and Agree with Plan of Care  Patient;Family member/caregiver     Family Member Consulted  mother       Patient will benefit from skilled therapeutic intervention in order to improve the following deficits and impairments:  Abnormal gait, Decreased range of motion, Decreased balance, Decreased mobility, Difficulty walking, Decreased activity tolerance, Decreased strength, Decreased endurance, Impaired flexibility  Visit Diagnosis: Difficulty in walking, not elsewhere classified  Muscle weakness (generalized)  Unsteadiness on feet  Foot drop, right     Problem List Patient Active Problem List   Diagnosis Date Noted  . Vitamin D insufficiency 11/29/2016  . Hypogonadism in male 11/01/2016  . Type 1 diabetes mellitus ([place])   . Hypothyroidism   . Hypertension   . Hyperlipidemia   . Closed fracture of right fibula and tibia 10/29/2016  . Closed tibia fracture 10/29/2016  . Closed fracture of right tibial plafond with fibula involvement 10/29/2016  . Abnormal liver function tests 03/06/2013  . RASH AND OTHER NONSPECIFIC SKIN ERUPTION 10/07/2009  . SHOULDER PAIN, LEFT 12/27/2008  . Type I (juvenile type) diabetes mellitus without mention of complication, uncontrolled 12/03/2007  . HYPOTHYROIDISM 11/12/2007  . HYPERCHOLESTEROLEMIA 11/12/2007  . MULTIPLE SCLEROSIS 08/29/2007  .  PROLIFERATIVE DIABETIC RETINOPATHY 08/29/2007  . HYPERTENSION 12/25/2006    Cecile Sheerer, SPTA 04/28/2018, 3:36 PM  [facility] [address] [place], [place], [address] Phone: [phone]   Fax:  [phone]  Name: Johnathan Ross MRN: [id number] Date of Birth: 1971-04-17

## 2018-04-29 DIAGNOSIS — H25013 Cortical age-related cataract, bilateral: Secondary | ICD-10-CM | POA: Diagnosis not present

## 2018-04-29 DIAGNOSIS — H35373 Puckering of macula, bilateral: Secondary | ICD-10-CM | POA: Diagnosis not present

## 2018-04-29 DIAGNOSIS — H2513 Age-related nuclear cataract, bilateral: Secondary | ICD-10-CM | POA: Diagnosis not present

## 2018-04-29 DIAGNOSIS — E109 Type 1 diabetes mellitus without complications: Secondary | ICD-10-CM | POA: Diagnosis not present

## 2018-04-30 ENCOUNTER — Ambulatory Visit: Payer: Medicare Other | Admitting: Physical Therapy

## 2018-04-30 ENCOUNTER — Encounter: Payer: Self-pay | Admitting: Physical Therapy

## 2018-04-30 DIAGNOSIS — M21371 Foot drop, right foot: Secondary | ICD-10-CM | POA: Diagnosis not present

## 2018-04-30 DIAGNOSIS — R262 Difficulty in walking, not elsewhere classified: Secondary | ICD-10-CM | POA: Diagnosis not present

## 2018-04-30 DIAGNOSIS — R2681 Unsteadiness on feet: Secondary | ICD-10-CM

## 2018-04-30 DIAGNOSIS — M6281 Muscle weakness (generalized): Secondary | ICD-10-CM

## 2018-04-30 NOTE — Therapy (Signed)
[facility] [address] [place], [place], [address] Phone: [phone]   Fax:  [phone]  Physical Therapy Treatment and 10th visit PN  Patient Details  Name: Johnathan Ross MRN: [id number] Date of Birth: 11-09-1970 Referring Provider (PT): Dr. Hyman Bower   Encounter Date: 04/30/2018   10th Visit Physical Therapy Progress Note  Dates of Reporting Period: 03/28/2018 to 04/30/2018   PT End of Session - 04/30/18 0853    Visit Number  10    Number of Visits  17    Date for PT Re-Evaluation  05/27/18    Authorization Type  Medicare Part A and B- * Requires 10th visit PN    PT Start Time  0849    PT Stop Time  0930    PT Time Calculation (min)  41 min    Equipment Utilized During Treatment  --    Activity Tolerance  Patient tolerated treatment well    Behavior During Therapy  [facility] for tasks assessed/performed       Past Medical History:  Diagnosis Date  . Hyperlipidemia   . Hypertension   . Hypogonadism in male 11/01/2016  . Hypothyroidism   . Multiple sclerosis ([place])   . 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 Type 1 diabetes mellitus ([place]) dx'd 1994  . Vitamin D insufficiency 11/29/2016    Past Surgical History:  Procedure Laterality Date  . EYE SURGERY Bilateral    "laser for diabetic retinopathy"  . FRACTURE SURGERY    . OPEN REDUCTION INTERNAL FIXATION (ORIF) TIBIA/FIBULA FRACTURE Right 10/30/2016  . ORIF ANKLE FRACTURE Left 2015  . ORIF TIBIA FRACTURE Right 10/30/2016   Procedure: OPEN REDUCTION INTERNAL FIXATION (ORIF) TIBIA FIBULA FRACTURE;  Surgeon: Altamese Paton, MD;  Location: [facility];  Service: Orthopedics;  Laterality: Right;  . RETINAL LASER PROCEDURE Bilateral    "for diabetic retinopathy"    There were no vitals filed for this visit.  Subjective Assessment - 04/30/18 0851    Subjective  Shoulders still stiff this morning.  No other issues to report.    Patient is accompained by:   Family member    Pertinent History  MS (dx 6 years prior), Type 1 DM, HLD, HTN, Hypogonadism in male, Hypothyroidism, Proliferative Diabetic Retinopathy, Vitamin D Insufficiency     Limitations  Lifting;Standing;Walking    How long can you sit comfortably?  unlimited    How long can you stand comfortably?  unlimited    How long can you walk comfortably?  takes frequent standing rest breaks during gait     Patient Stated Goals  Walk without an AD and improve his balance to increase safety with functional mobility     Currently in Pain?  No/denies         [facility] PT Assessment - 04/30/18 0853      Ambulation/Gait   Ambulation/Gait  Yes    Ambulation/Gait Assistance  5: Supervision    Ambulation Distance (Feet)  230 Feet    Assistive device  Straight cane    Gait Pattern  Step-through pattern;Decreased stride length    Ambulation Surface  Level;Indoor      Standardized Balance Assessment   10 Meter Walk  26.43 with cane with one standing rest break or 1.24 ft/sec                    [facility] Adult PT Treatment/Exercise - 04/30/18 0853      Ambulation/Gait   Ambulation/Gait Assistance Details  2-3 standing rest breaks but demonstrated more upright posture, decreased foot drag/catch today and improved hamstring activation for knee flexion during swing phase      Neuro Re-ed    Neuro Re-ed Details   On floor in tall kneeling performed lateral weight shifting and lateral stepping with intermittent UE support to focus on weight shifting during LE advancement.  Added in tall kneeling squats in midline against resistance of blue theraband x 10 reps + 10 reps each side squat to R and L against diagonal pull of theraband without use of UE             PT Education - 04/30/18 1056    Education provided  Yes    Education Details  progress towards goals    Person(s) Educated  Patient    Methods  Explanation    Comprehension  Verbalized understanding       PT Short Term Goals  - 04/30/18 1100      PT SHORT TERM GOAL #1   Title  Pt will participate in establishment of initial HEP to improve balance and safety with functional mobility    Baseline  04/28/18: met today     Status  Achieved      PT SHORT TERM GOAL #2   Title  Pt will improve Berg score to >/=42/56 indicating reduction in fall risk potential and improvement with balance     Baseline  04/28/18: 38/56    Status  Not Met      PT SHORT TERM GOAL #3   Title  Pt will improve gait speed to >/=2.64 ft/sec with his cane and R AFO demonstrating improvement in functional mobility    Baseline  decreased to 1.24 ft/sec    Time  4    Period  Weeks    Status  Not Met      PT SHORT TERM GOAL #4   Title  Pt will demonstrate improved LE strength as indicated by decrease in five time sit to stand < or = 13 seconds with no UE assist from a standard height chair.     Baseline  04/28/18: 13.47 from standard height chair using UE assist.     Status  Partially Met      PT SHORT TERM GOAL #5   Title  Pt will ambulate 230 feet indoors with his cane and R AFO on level surfaces and therapist providing supervision indicating improvement in functional mobility     Baseline  230' with cane with standing rest breaks with supervision    Time  4    Period  Weeks    Status  Achieved        PT Long Term Goals - 04/30/18 1110      PT LONG TERM GOAL #1   Title  Pt will be independent with his HEP to improve balance and functional mobility     Time  8    Period  Weeks    Status  New    Target Date  05/27/18      PT LONG TERM GOAL #2   Title  Pt will ambulate 250' outside on pavement and negotiate curb with supervision with AFO and cane to improve safety with community distances    Time  8    Period  Weeks    Status  Revised    Target Date  05/27/18      PT LONG TERM GOAL #3   Title  Pt will improve Merrilee Jansky  score to >/= 42/56 indicating reduction in fall risk potential     Time  8    Period  Weeks    Status  Revised     Target Date  05/27/18      PT LONG TERM GOAL #4   Title  Pt will improve gait speed with his cane and R AFO to >/= 2.6 ft/sec indicative of improvement in functional mobility     Time  8    Period  Weeks    Status  Revised    Target Date  05/27/18            Plan - 04/30/18 7829    Clinical Impression Statement  Completed assessment of STG today with gait velocity and gait/endurance with cane.  Pt is making steady progress and has met 2 STG, partially meeting 1 goal and not meeting BERG or gait velocity goal- pt experienced decrease in gait velocity with cane as compared with evaluation and BERG score remained the same.  Pt did demonstrate improved postural control, balance, gait sequencing and endurance today with cane.  Continued to focus on core strength and proximal stability in tall kneeling adding resistance today to facilitate increased mm activation.  Pt reported feeling less use of shoulder and neck mm today during exercises.  Will continue to progress towards LTG.    Rehab Potential  Good    Clinical Impairments Affecting Rehab Potential  Progressive nature of MS dx    PT Frequency  2x / week    PT Duration  8 weeks    PT Treatment/Interventions  ADLs/Self Care Home Management;Gait training;Stair training;Functional mobility training;Therapeutic activities;Therapeutic exercise;Balance training;Neuromuscular re-education;Passive range of motion;Manual techniques;Patient/family education;Electrical Stimulation;DME Instruction;Energy conservation    PT Next Visit Plan  Tall kneeling against resistance of theraband, weight shifting with stepping ant/post/lat in tall kneeling.  Talk to suzanne about a Tuesday pm for aquatic; postural control during UE and LE movement decreasing use of upper trap; balance, proximal hip strengthening and stability - tall kneeling weight shifting, side stepping, forwards/backwards advancement    PT Home Exercise Plan  Access Code: [email or web address]     Consulted  and Agree with Plan of Care  Patient;Family member/caregiver    Family Member Consulted  mother       Patient will benefit from skilled therapeutic intervention in order to improve the following deficits and impairments:  Abnormal gait, Decreased range of motion, Decreased balance, Decreased mobility, Difficulty walking, Decreased activity tolerance, Decreased strength, Decreased endurance, Impaired flexibility  Visit Diagnosis: Difficulty in walking, not elsewhere classified  Muscle weakness (generalized)  Unsteadiness on feet  Foot drop, right     Problem List Patient Active Problem List   Diagnosis Date Noted  . Vitamin D insufficiency 11/29/2016  . Hypogonadism in male 11/01/2016  . Type 1 diabetes mellitus ([place])   . Hypothyroidism   . Hypertension   . Hyperlipidemia   . Closed fracture of right fibula and tibia 10/29/2016  . Closed tibia fracture 10/29/2016  . Closed fracture of right tibial plafond with fibula involvement 10/29/2016  . Abnormal liver function tests 03/06/2013  . RASH AND OTHER NONSPECIFIC SKIN ERUPTION 10/07/2009  . SHOULDER PAIN, LEFT 12/27/2008  . Type I (juvenile type) diabetes mellitus without mention of complication, uncontrolled 12/03/2007  . HYPOTHYROIDISM 11/12/2007  . HYPERCHOLESTEROLEMIA 11/12/2007  . MULTIPLE SCLEROSIS 08/29/2007  . PROLIFERATIVE DIABETIC RETINOPATHY 08/29/2007  . HYPERTENSION 12/25/2006    [name], PT, DPT 04/30/18    11:11 AM  [facility] [address] [place], [place], [address] Phone: [phone]   Fax:  [phone]  Name: KRITHIK MAPEL MRN: [id number] Date of Birth: 1971-01-03

## 2018-05-05 ENCOUNTER — Encounter: Payer: Self-pay | Admitting: Physical Therapy

## 2018-05-05 ENCOUNTER — Ambulatory Visit: Payer: Medicare Other | Admitting: Physical Therapy

## 2018-05-05 DIAGNOSIS — R262 Difficulty in walking, not elsewhere classified: Secondary | ICD-10-CM

## 2018-05-05 DIAGNOSIS — R2681 Unsteadiness on feet: Secondary | ICD-10-CM

## 2018-05-05 DIAGNOSIS — M6281 Muscle weakness (generalized): Secondary | ICD-10-CM

## 2018-05-05 DIAGNOSIS — M21371 Foot drop, right foot: Secondary | ICD-10-CM | POA: Diagnosis not present

## 2018-05-05 NOTE — Therapy (Signed)
[facility] [address] [place], [place], [address] Phone: [phone]   Fax:  [phone]  Physical Therapy Treatment  Patient Details  Name: Johnathan Ross MRN: [id number] Date of Birth: [birth year] Referring Provider (PT): Dr. Hyman Bower   Encounter Date: 05/05/2018  PT End of Session - 05/05/18 1334    Visit Number  11    Number of Visits  17    Date for PT Re-Evaluation  05/27/18    Authorization Type  Medicare Part A and B- * Requires 10th visit PN    PT Start Time  0847    PT Stop Time  0932    PT Time Calculation (min)  45 min    Activity Tolerance  Patient tolerated treatment well    Behavior During Therapy  [facility] for tasks assessed/performed       Past Medical History:  Diagnosis Date  . Hyperlipidemia   . Hypertension   . Hypogonadism in male 11/01/2016  . Hypothyroidism   . Multiple sclerosis ([place])   . 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 Type 1 diabetes mellitus ([place]) dx'd 1994  . Vitamin D insufficiency 11/29/2016    Past Surgical History:  Procedure Laterality Date  . EYE SURGERY Bilateral    "laser for diabetic retinopathy"  . FRACTURE SURGERY    . OPEN REDUCTION INTERNAL FIXATION (ORIF) TIBIA/FIBULA FRACTURE Right 10/30/2016  . ORIF ANKLE FRACTURE Left 2015  . ORIF TIBIA FRACTURE Right 10/30/2016   Procedure: OPEN REDUCTION INTERNAL FIXATION (ORIF) TIBIA FIBULA FRACTURE;  Surgeon: Altamese Kittrell, MD;  Location: [facility];  Service: Orthopedics;  Laterality: Right;  . RETINAL LASER PROCEDURE Bilateral    "for diabetic retinopathy"    There were no vitals filed for this visit.  Subjective Assessment - 05/05/18 0854    Subjective  Had a good weekend; worked outside some.  Is willing to start aquatic in January.  Ambulating this morning with LE adduction/scissoring.    Patient is accompained by:  Family member    Pertinent History  MS (dx 6 years prior), Type 1 DM, HLD, HTN,  Hypogonadism in male, Hypothyroidism, Proliferative Diabetic Retinopathy, Vitamin D Insufficiency     Limitations  Lifting;Standing;Walking    How long can you sit comfortably?  unlimited    How long can you stand comfortably?  unlimited    How long can you walk comfortably?  takes frequent standing rest breaks during gait     Patient Stated Goals  Walk without an AD and improve his balance to increase safety with functional mobility     Currently in Pain?  No/denies                       [facility] Adult PT Treatment/Exercise - 05/05/18 1321      Neuro Re-ed    Neuro Re-ed Details   Continued NMR, core and weight shift training in tall kneeling on red mat on floor.  Performed 10 reps each blue theraband resisted tall kneeling squats in midline and then with R/L bias without UE support.  On final repetition focused on maintaining COG and weight over one LE while advancing contralateral LE forwards and back.  Performed reciprocal LE advancement forwards and back without UE support to continue to work on weight shifting and hamstring strengthening.      Exercises   Exercises  Neck      Neck Exercises: Stretches   Upper Trapezius Stretch  Right;Left;2 reps;30 seconds  PT Education - 05/05/18 1334    Education provided  Yes    Education Details  adding aquatic therapy; upper trap stretches; may consider TDN and kinesiotaping    Person(s) Educated  Patient    Methods  Explanation    Comprehension  Verbalized understanding       PT Short Term Goals - 04/30/18 1100      PT SHORT TERM GOAL #1   Title  Pt will participate in establishment of initial HEP to improve balance and safety with functional mobility    Baseline  04/28/18: met today     Status  Achieved      PT SHORT TERM GOAL #2   Title  Pt will improve Berg score to >/=42/56 indicating reduction in fall risk potential and improvement with balance     Baseline  04/28/18: 38/56    Status  Not Met       PT SHORT TERM GOAL #3   Title  Pt will improve gait speed to >/=2.64 ft/sec with his cane and R AFO demonstrating improvement in functional mobility    Baseline  decreased to 1.24 ft/sec    Time  4    Period  Weeks    Status  Not Met      PT SHORT TERM GOAL #4   Title  Pt will demonstrate improved LE strength as indicated by decrease in five time sit to stand < or = 13 seconds with no UE assist from a standard height chair.     Baseline  04/28/18: 13.47 from standard height chair using UE assist.     Status  Partially Met      PT SHORT TERM GOAL #5   Title  Pt will ambulate 230 feet indoors with his cane and R AFO on level surfaces and therapist providing supervision indicating improvement in functional mobility     Baseline  230' with cane with standing rest breaks with supervision    Time  4    Period  Weeks    Status  Achieved        PT Long Term Goals - 04/30/18 1110      PT LONG TERM GOAL #1   Title  Pt will be independent with his HEP to improve balance and functional mobility     Time  8    Period  Weeks    Status  New    Target Date  05/27/18      PT LONG TERM GOAL #2   Title  Pt will ambulate 250' outside on pavement and negotiate curb with supervision with AFO and cane to improve safety with community distances    Time  8    Period  Weeks    Status  Revised    Target Date  05/27/18      PT LONG TERM GOAL #3   Title  Pt will improve Berg score to >/= 42/56 indicating reduction in fall risk potential     Time  8    Period  Weeks    Status  Revised    Target Date  05/27/18      PT LONG TERM GOAL #4   Title  Pt will improve gait speed with his cane and R AFO to >/= 2.6 ft/sec indicative of improvement in functional mobility     Time  8    Period  Weeks    Status  Revised    Target Date  05/27/18  Plan - 05/05/18 1335    Clinical Impression Statement  Continued NMR, weight shift and core training in tall kneeling against resistance of blue  theraband and discussed alternative ways to manage UE tightness and promote motor control re-education (kinesiotape and TDN).  Pt tolerated well and return demonstrated upper trap stretches.  Pt is also agreeable to participate in aquatic therapy in January.  Will continue to progress towards LTG.    Rehab Potential  Good    Clinical Impairments Affecting Rehab Potential  Progressive nature of MS dx    PT Frequency  2x / week    PT Duration  8 weeks    PT Treatment/Interventions  ADLs/Self Care Home Management;Gait training;Stair training;Functional mobility training;Therapeutic activities;Therapeutic exercise;Balance training;Neuromuscular re-education;Passive range of motion;Manual techniques;Patient/family education;Electrical Stimulation;DME Instruction;Energy conservation    PT Next Visit Plan  Kinesiotape for shoulders.  Need to add taping, aquatic and TDN to POC - check visits.  Tall kneeling against resistance of theraband, weight shifting with stepping ant/post/lat in tall kneeling. postural control during UE and LE movement decreasing use of upper trap; balance, proximal hip strengthening and stability - tall kneeling weight shifting, side stepping, forwards/backwards advancement    PT Home Exercise Plan  Access Code: [email or web address]     Consulted and Agree with Plan of Care  Patient;Family member/caregiver    Family Member Consulted  mother       Patient will benefit from skilled therapeutic intervention in order to improve the following deficits and impairments:  Abnormal gait, Decreased range of motion, Decreased balance, Decreased mobility, Difficulty walking, Decreased activity tolerance, Decreased strength, Decreased endurance, Impaired flexibility  Visit Diagnosis: Difficulty in walking, not elsewhere classified  Muscle weakness (generalized)  Unsteadiness on feet  Foot drop, right     Problem List Patient Active Problem List   Diagnosis Date Noted  . Vitamin D insufficiency  11/29/2016  . Hypogonadism in male 11/01/2016  . Type 1 diabetes mellitus ([place])   . Hypothyroidism   . Hypertension   . Hyperlipidemia   . Closed fracture of right fibula and tibia 10/29/2016  . Closed tibia fracture 10/29/2016  . Closed fracture of right tibial plafond with fibula involvement 10/29/2016  . Abnormal liver function tests 03/06/2013  . RASH AND OTHER NONSPECIFIC SKIN ERUPTION 10/07/2009  . SHOULDER PAIN, LEFT 12/27/2008  . Type I (juvenile type) diabetes mellitus without mention of complication, uncontrolled 12/03/2007  . HYPOTHYROIDISM 11/12/2007  . HYPERCHOLESTEROLEMIA 11/12/2007  . MULTIPLE SCLEROSIS 08/29/2007  . PROLIFERATIVE DIABETIC RETINOPATHY 08/29/2007  . HYPERTENSION 12/25/2006    [name], PT, DPT 05/05/18    1:42 PM    [facility] [address] [place], [place], [address] Phone: [phone]   Fax:  [phone]  Name: Johnathan Ross MRN: [id number] Date of Birth: Jan 27, 1971

## 2018-05-07 ENCOUNTER — Encounter: Payer: Self-pay | Admitting: Physical Therapy

## 2018-05-07 ENCOUNTER — Ambulatory Visit: Payer: Medicare Other | Admitting: Physical Therapy

## 2018-05-07 DIAGNOSIS — R262 Difficulty in walking, not elsewhere classified: Secondary | ICD-10-CM

## 2018-05-07 DIAGNOSIS — M21371 Foot drop, right foot: Secondary | ICD-10-CM

## 2018-05-07 DIAGNOSIS — M6281 Muscle weakness (generalized): Secondary | ICD-10-CM | POA: Diagnosis not present

## 2018-05-07 DIAGNOSIS — R2681 Unsteadiness on feet: Secondary | ICD-10-CM

## 2018-05-07 NOTE — Therapy (Signed)
[facility] [address] [place], [place], [address] Phone: [phone]   Fax:  [phone]  Physical Therapy Treatment  Patient Details  Name: Johnathan Ross MRN: [id number] Date of Birth: 11-14-1970 Referring Provider (PT): Dr. Hyman Bower   Encounter Date: 05/07/2018  PT End of Session - 05/07/18 1709    Visit Number  12    Number of Visits  17    Date for PT Re-Evaluation  05/27/18    Authorization Type  Medicare Part A and B- * Requires 10th visit PN    PT Start Time  0853    PT Stop Time  0934    PT Time Calculation (min)  41 min    Activity Tolerance  Patient tolerated treatment well    Behavior During Therapy  [facility] for tasks assessed/performed       Past Medical History:  Diagnosis Date  . Hyperlipidemia   . Hypertension   . Hypogonadism in male 11/01/2016  . Hypothyroidism   . Multiple sclerosis ([place])   . 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 Type 1 diabetes mellitus ([place]) dx'd 1994  . Vitamin D insufficiency 11/29/2016    Past Surgical History:  Procedure Laterality Date  . EYE SURGERY Bilateral    "laser for diabetic retinopathy"  . FRACTURE SURGERY    . OPEN REDUCTION INTERNAL FIXATION (ORIF) TIBIA/FIBULA FRACTURE Right 10/30/2016  . ORIF ANKLE FRACTURE Left 2015  . ORIF TIBIA FRACTURE Right 10/30/2016   Procedure: OPEN REDUCTION INTERNAL FIXATION (ORIF) TIBIA FIBULA FRACTURE;  Surgeon: Altamese [place], MD;  Location: [facility];  Service: Orthopedics;  Laterality: Right;  . RETINAL LASER PROCEDURE Bilateral    "for diabetic retinopathy"    There were no vitals filed for this visit.  Subjective Assessment - 05/07/18 0858    Subjective  Shoulders not hurting this morning, using cane today.  Discussed doing aquatic therapy without insulin pump and recommendations to avoid hypoglycemia.    Patient is accompained by:  Family member    Pertinent History  MS (dx 6 years prior), Type 1 DM, HLD,  HTN, Hypogonadism in male, Hypothyroidism, Proliferative Diabetic Retinopathy, Vitamin D Insufficiency     Limitations  Lifting;Standing;Walking    How long can you sit comfortably?  unlimited    How long can you stand comfortably?  unlimited    How long can you walk comfortably?  takes frequent standing rest breaks during gait     Patient Stated Goals  Walk without an AD and improve his balance to increase safety with functional mobility     Currently in Pain?  No/denies                       [facility] Adult PT Treatment/Exercise - 05/07/18 1659      Ambulation/Gait   Ambulation/Gait  Yes    Ambulation/Gait Assistance  5: Supervision    Ambulation/Gait Assistance Details  decreased foot drag today but continued to have slight scissoring of RLE    Ambulation Distance (Feet)  100 Feet    Assistive device  Straight cane    Gait Pattern  Scissoring    Ambulation Surface  Level;Indoor      Neuro Re-ed    Neuro Re-ed Details   NMR in tall kneeling on mat in staggered stance with R/L LE forwards performing resisted diagonal squats to continue to facilitate weight shifting and hip extension.  Transitioned to tall kneeling with UE on ball in front  of pt on mat.  Performed therapy ball roll outs and back hinging at knees (keeping hips in alignment) for increased core and hamstring activation; transition back to staggered tall kneeling and peformed diagonal ball roll outs to again facilitate anterior/lateral weight shifting with increased hamstring activation.  Combined diagonal roll outs and weight shifting with alternating LE advancement in tall kneeling with UE only on ball x 3 sets.        Exercises   Exercises  Knee/Hip      Knee/Hip Exercises: Stretches   Hip Flexor Stretch  Right;Left;2 reps;30 seconds    Hip Flexor Stretch Limitations  in prone with knee in flexion             PT Education - 05/07/18 1709    Education provided  Yes    Education Details  if pt removes  insulin pump for aquatics advised pt to eat a balanced meal before coming and to bring a snack    Person(s) Educated  Patient    Methods  Explanation    Comprehension  Verbalized understanding       PT Short Term Goals - 04/30/18 1100      PT SHORT TERM GOAL #1   Title  Pt will participate in establishment of initial HEP to improve balance and safety with functional mobility    Baseline  04/28/18: met today     Status  Achieved      PT SHORT TERM GOAL #2   Title  Pt will improve Berg score to >/=42/56 indicating reduction in fall risk potential and improvement with balance     Baseline  04/28/18: 38/56    Status  Not Met      PT SHORT TERM GOAL #3   Title  Pt will improve gait speed to >/=2.64 ft/sec with his cane and R AFO demonstrating improvement in functional mobility    Baseline  decreased to 1.24 ft/sec    Time  4    Period  Weeks    Status  Not Met      PT SHORT TERM GOAL #4   Title  Pt will demonstrate improved LE strength as indicated by decrease in five time sit to stand < or = 13 seconds with no UE assist from a standard height chair.     Baseline  04/28/18: 13.47 from standard height chair using UE assist.     Status  Partially Met      PT SHORT TERM GOAL #5   Title  Pt will ambulate 230 feet indoors with his cane and R AFO on level surfaces and therapist providing supervision indicating improvement in functional mobility     Baseline  230' with cane with standing rest breaks with supervision    Time  4    Period  Weeks    Status  Achieved        PT Long Term Goals - 04/30/18 1110      PT LONG TERM GOAL #1   Title  Pt will be independent with his HEP to improve balance and functional mobility     Time  8    Period  Weeks    Status  New    Target Date  05/27/18      PT LONG TERM GOAL #2   Title  Pt will ambulate 250' outside on pavement and negotiate curb with supervision with AFO and cane to improve safety with community distances    Time  8  Period   Weeks    Status  Revised    Target Date  05/27/18      PT LONG TERM GOAL #3   Title  Pt will improve Berg score to >/= 42/56 indicating reduction in fall risk potential     Time  8    Period  Weeks    Status  Revised    Target Date  05/27/18      PT LONG TERM GOAL #4   Title  Pt will improve gait speed with his cane and R AFO to >/= 2.6 ft/sec indicative of improvement in functional mobility     Time  8    Period  Weeks    Status  Revised    Target Date  05/27/18            Plan - 05/07/18 1710    Clinical Impression Statement  Continued NMR, weight shift, core and hamstring training in tall kneeling against resistance of blue theraband with increased focus on anterior-lateral weight shifting. Pt noted decreased tension in upper traps today therefore, kinesiotape not required.  Will continue to progress towards LTG.    Rehab Potential  Good    Clinical Impairments Affecting Rehab Potential  Progressive nature of MS dx    PT Frequency  2x / week    PT Duration  8 weeks    PT Treatment/Interventions  ADLs/Self Care Home Management;Gait training;Stair training;Functional mobility training;Therapeutic activities;Therapeutic exercise;Balance training;Neuromuscular re-education;Passive range of motion;Manual techniques;Patient/family education;Electrical Stimulation;DME Instruction;Energy conservation    PT Next Visit Plan  Tall kneeling against resistance of theraband, weight shifting with stepping ant/post/lat in tall kneeling. Half kneeling.  postural control during UE and LE movement decreasing use of upper trap    PT Home Exercise Plan  Access Code: [email or web address]     Consulted and Agree with Plan of Care  Patient;Family member/caregiver    Family Member Consulted  mother       Patient will benefit from skilled therapeutic intervention in order to improve the following deficits and impairments:  Abnormal gait, Decreased range of motion, Decreased balance, Decreased mobility,  Difficulty walking, Decreased activity tolerance, Decreased strength, Decreased endurance, Impaired flexibility  Visit Diagnosis: Difficulty in walking, not elsewhere classified  Muscle weakness (generalized)  Unsteadiness on feet  Foot drop, right     Problem List Patient Active Problem List   Diagnosis Date Noted  . Vitamin D insufficiency 11/29/2016  . Hypogonadism in male 11/01/2016  . Type 1 diabetes mellitus ([place])   . Hypothyroidism   . Hypertension   . Hyperlipidemia   . Closed fracture of right fibula and tibia 10/29/2016  . Closed tibia fracture 10/29/2016  . Closed fracture of right tibial plafond with fibula involvement 10/29/2016  . Abnormal liver function tests 03/06/2013  . RASH AND OTHER NONSPECIFIC SKIN ERUPTION 10/07/2009  . SHOULDER PAIN, LEFT 12/27/2008  . Type I (juvenile type) diabetes mellitus without mention of complication, uncontrolled 12/03/2007  . HYPOTHYROIDISM 11/12/2007  . HYPERCHOLESTEROLEMIA 11/12/2007  . MULTIPLE SCLEROSIS 08/29/2007  . PROLIFERATIVE DIABETIC RETINOPATHY 08/29/2007  . HYPERTENSION 12/25/2006    [name], PT, DPT 05/07/18    5:18 PM    [facility] [address] [place], [place], [address] Phone: [phone]   Fax:  [phone]  Name: QUANTEZ SCHNYDER MRN: [id number] Date of Birth: 04/24/1971

## 2018-05-12 ENCOUNTER — Encounter: Payer: Self-pay | Admitting: Physical Therapy

## 2018-05-12 ENCOUNTER — Ambulatory Visit: Payer: Medicare Other | Admitting: Physical Therapy

## 2018-05-12 DIAGNOSIS — M6281 Muscle weakness (generalized): Secondary | ICD-10-CM

## 2018-05-12 DIAGNOSIS — M21371 Foot drop, right foot: Secondary | ICD-10-CM | POA: Diagnosis not present

## 2018-05-12 DIAGNOSIS — R262 Difficulty in walking, not elsewhere classified: Secondary | ICD-10-CM | POA: Diagnosis not present

## 2018-05-12 DIAGNOSIS — R2681 Unsteadiness on feet: Secondary | ICD-10-CM | POA: Diagnosis not present

## 2018-05-13 NOTE — Therapy (Signed)
[facility] [address] [place], [place], [address] Phone: [phone]   Fax:  [phone]  Physical Therapy Treatment  Patient Details  Name: Johnathan Ross MRN: [id number] Date of Birth: [birth year] Referring Provider (PT): Dr. Hyman Bower   Encounter Date: 05/12/2018  PT End of Session - 05/13/18 1227    Visit Number  13    Number of Visits  17    Date for PT Re-Evaluation  05/27/18    Authorization Type  Medicare Part A and B- * Requires 10th visit PN    PT Start Time  0847    PT Stop Time  0930    PT Time Calculation (min)  43 min    Activity Tolerance  Patient tolerated treatment well    Behavior During Therapy  [facility] for tasks assessed/performed       Past Medical History:  Diagnosis Date  . Hyperlipidemia   . Hypertension   . Hypogonadism in male 11/01/2016  . Hypothyroidism   . Multiple sclerosis ([place])   . 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 Type 1 diabetes mellitus ([place]) dx'd 1994  . Vitamin D insufficiency 11/29/2016    Past Surgical History:  Procedure Laterality Date  . EYE SURGERY Bilateral    "laser for diabetic retinopathy"  . FRACTURE SURGERY    . OPEN REDUCTION INTERNAL FIXATION (ORIF) TIBIA/FIBULA FRACTURE Right 10/30/2016  . ORIF ANKLE FRACTURE Left 2015  . ORIF TIBIA FRACTURE Right 10/30/2016   Procedure: OPEN REDUCTION INTERNAL FIXATION (ORIF) TIBIA FIBULA FRACTURE;  Surgeon: Altamese Berwick, MD;  Location: [facility];  Service: Orthopedics;  Laterality: Right;  . RETINAL LASER PROCEDURE Bilateral    "for diabetic retinopathy"    There were no vitals filed for this visit.  Subjective Assessment - 05/12/18 0850    Subjective  Doing well; no issues over the weekend. Still walking with cane today,    Patient is accompained by:  Family member    Pertinent History  MS (dx 6 years prior), Type 1 DM, HLD, HTN, Hypogonadism in male, Hypothyroidism, Proliferative Diabetic Retinopathy,  Vitamin D Insufficiency     Limitations  Lifting;Standing;Walking    How long can you sit comfortably?  unlimited    How long can you stand comfortably?  unlimited    How long can you walk comfortably?  takes frequent standing rest breaks during gait     Patient Stated Goals  Walk without an AD and improve his balance to increase safety with functional mobility     Currently in Pain?  No/denies                       [facility] Adult PT Treatment/Exercise - 05/12/18 0903      Ambulation/Gait   Ambulation/Gait  Yes    Ambulation/Gait Assistance  5: Supervision    Ambulation/Gait Assistance Details  with cane; increased supervision-min guard required after NMR due to LE fatigue and one episode of catching R foot on floor    Ambulation Distance (Feet)  115 Feet    Assistive device  Straight cane    Gait Pattern  Poor foot clearance - right    Ambulation Surface  Level;Indoor    Curb  4: Min assist    Curb Details (indicate cue type and reason)  with cane; min A for safey only.  No evidence of imbalance or catching R foot.      Neuro Re-ed    Neuro Re-ed Details  NMR in tall kneeling focusing on weight shift anterior and lateral to transition to half kneeling on each side x 8 reps with one UE support on mat.  On final repetition had pt hold 1/2 kneeling x 10 seconds without UE support for increased core and hamstring activation.  Transitioned to standing in // bars staggered stance performing 10 rep diagonal weight sifts against resistance of blue theraband.  Transitioned to weight shift with contralateral LE forward tap to 2" step x 10 reps each side.  Final exercise performed resisted forwards and retro gait in bars against resistance of blue theraband for increased hip extension activation and core activation for increased control during gait.                 PT Short Term Goals - 04/30/18 1100      PT SHORT TERM GOAL #1   Title  Pt will participate in establishment of  initial HEP to improve balance and safety with functional mobility    Baseline  04/28/18: met today     Status  Achieved      PT SHORT TERM GOAL #2   Title  Pt will improve Berg score to >/=42/56 indicating reduction in fall risk potential and improvement with balance     Baseline  04/28/18: 38/56    Status  Not Met      PT SHORT TERM GOAL #3   Title  Pt will improve gait speed to >/=2.64 ft/sec with his cane and R AFO demonstrating improvement in functional mobility    Baseline  decreased to 1.24 ft/sec    Time  4    Period  Weeks    Status  Not Met      PT SHORT TERM GOAL #4   Title  Pt will demonstrate improved LE strength as indicated by decrease in five time sit to stand < or = 13 seconds with no UE assist from a standard height chair.     Baseline  04/28/18: 13.47 from standard height chair using UE assist.     Status  Partially Met      PT SHORT TERM GOAL #5   Title  Pt will ambulate 230 feet indoors with his cane and R AFO on level surfaces and therapist providing supervision indicating improvement in functional mobility     Baseline  230' with cane with standing rest breaks with supervision    Time  4    Period  Weeks    Status  Achieved        PT Long Term Goals - 04/30/18 1110      PT LONG TERM GOAL #1   Title  Pt will be independent with his HEP to improve balance and functional mobility     Time  8    Period  Weeks    Status  New    Target Date  05/27/18      PT LONG TERM GOAL #2   Title  Pt will ambulate 250' outside on pavement and negotiate curb with supervision with AFO and cane to improve safety with community distances    Time  8    Period  Weeks    Status  Revised    Target Date  05/27/18      PT LONG TERM GOAL #3   Title  Pt will improve Berg score to >/= 42/56 indicating reduction in fall risk potential     Time  8    Period  Weeks  Status  Revised    Target Date  05/27/18      PT LONG TERM GOAL #4   Title  Pt will improve gait speed with  his cane and R AFO to >/= 2.6 ft/sec indicative of improvement in functional mobility     Time  8    Period  Weeks    Status  Revised    Target Date  05/27/18            Plan - 05/13/18 1228    Clinical Impression Statement  Continued NMR and proximal stability training in tall kneeling progressing task to standing and ambulation.  Pt tolerated very well with significant improvements in balance and postural control.  Pt also reporting increased use of cane.  Reviewed curb negotiation with cane with pt performing without safety concerns.  Will continue to progress towards LTG.    Rehab Potential  Good    Clinical Impairments Affecting Rehab Potential  Progressive nature of MS dx    PT Frequency  2x / week    PT Duration  8 weeks    PT Treatment/Interventions  ADLs/Self Care Home Management;Gait training;Stair training;Functional mobility training;Therapeutic activities;Therapeutic exercise;Balance training;Neuromuscular re-education;Passive range of motion;Manual techniques;Patient/family education;Electrical Stimulation;DME Instruction;Energy conservation    PT Next Visit Plan  Tall kneeling against resistance of theraband, weight shifting with stepping ant/post/lat in tall kneeling. Half kneeling/standing/gait against resistance to increase postural control during UE and LE movement.  Hamstring activation.      PT Home Exercise Plan  Access Code: [email or web address]     Consulted and Agree with Plan of Care  Patient;Family member/caregiver    Family Member Consulted  mother       Patient will benefit from skilled therapeutic intervention in order to improve the following deficits and impairments:  Abnormal gait, Decreased range of motion, Decreased balance, Decreased mobility, Difficulty walking, Decreased activity tolerance, Decreased strength, Decreased endurance, Impaired flexibility  Visit Diagnosis: Difficulty in walking, not elsewhere classified  Muscle weakness  (generalized)  Unsteadiness on feet  Foot drop, right     Problem List Patient Active Problem List   Diagnosis Date Noted  . Vitamin D insufficiency 11/29/2016  . Hypogonadism in male 11/01/2016  . Type 1 diabetes mellitus ([place])   . Hypothyroidism   . Hypertension   . Hyperlipidemia   . Closed fracture of right fibula and tibia 10/29/2016  . Closed tibia fracture 10/29/2016  . Closed fracture of right tibial plafond with fibula involvement 10/29/2016  . Abnormal liver function tests 03/06/2013  . RASH AND OTHER NONSPECIFIC SKIN ERUPTION 10/07/2009  . SHOULDER PAIN, LEFT 12/27/2008  . Type I (juvenile type) diabetes mellitus without mention of complication, uncontrolled 12/03/2007  . HYPOTHYROIDISM 11/12/2007  . HYPERCHOLESTEROLEMIA 11/12/2007  . MULTIPLE SCLEROSIS 08/29/2007  . PROLIFERATIVE DIABETIC RETINOPATHY 08/29/2007  . HYPERTENSION 12/25/2006   [name], PT, DPT 05/13/18    12:35 PM    [facility] [address] [place], [place], [address] Phone: [phone]   Fax:  [phone]  Name: Johnathan Ross MRN: [id number] Date of Birth: 12-Jan-1971

## 2018-05-14 ENCOUNTER — Ambulatory Visit: Payer: Medicare Other | Admitting: Physical Therapy

## 2018-05-14 ENCOUNTER — Encounter: Payer: Self-pay | Admitting: Physical Therapy

## 2018-05-14 DIAGNOSIS — R262 Difficulty in walking, not elsewhere classified: Secondary | ICD-10-CM | POA: Diagnosis not present

## 2018-05-14 DIAGNOSIS — M6281 Muscle weakness (generalized): Secondary | ICD-10-CM

## 2018-05-14 DIAGNOSIS — M21371 Foot drop, right foot: Secondary | ICD-10-CM

## 2018-05-14 DIAGNOSIS — R2681 Unsteadiness on feet: Secondary | ICD-10-CM

## 2018-05-14 NOTE — Therapy (Signed)
[facility] [address] [place], [place], [address] Phone: [phone]   Fax:  [phone]  Physical Therapy Treatment  Patient Details  Name: Johnathan Ross MRN: [id number] Date of Birth: 09/28/1970 Referring Provider (PT): Dr. Hyman Bower   Encounter Date: 05/14/2018  PT End of Session - 05/14/18 1333    Visit Number  14    Number of Visits  17    Date for PT Re-Evaluation  05/27/18    Authorization Type  Medicare Part A and B- * Requires 10th visit PN    PT Start Time  0843    PT Stop Time  0931    PT Time Calculation (min)  48 min    Activity Tolerance  Patient tolerated treatment well    Behavior During Therapy  [facility] for tasks assessed/performed       Past Medical History:  Diagnosis Date  . Hyperlipidemia   . Hypertension   . Hypogonadism in male 11/01/2016  . Hypothyroidism   . Multiple sclerosis ([place])   . 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 Type 1 diabetes mellitus ([place]) dx'd 1994  . Vitamin D insufficiency 11/29/2016    Past Surgical History:  Procedure Laterality Date  . EYE SURGERY Bilateral    "laser for diabetic retinopathy"  . FRACTURE SURGERY    . OPEN REDUCTION INTERNAL FIXATION (ORIF) TIBIA/FIBULA FRACTURE Right 10/30/2016  . ORIF ANKLE FRACTURE Left 2015  . ORIF TIBIA FRACTURE Right 10/30/2016   Procedure: OPEN REDUCTION INTERNAL FIXATION (ORIF) TIBIA FIBULA FRACTURE;  Surgeon: Altamese Summer Shade, MD;  Location: [facility];  Service: Orthopedics;  Laterality: Right;  . RETINAL LASER PROCEDURE Bilateral    "for diabetic retinopathy"    There were no vitals filed for this visit.  Subjective Assessment - 05/14/18 0847    Subjective  Feeling sore in his hips today.  Slower moving today, foot dragging more.    Patient is accompained by:  Family member    Pertinent History  MS (dx 6 years prior), Type 1 DM, HLD, HTN, Hypogonadism in male, Hypothyroidism, Proliferative Diabetic Retinopathy,  Vitamin D Insufficiency     Limitations  Lifting;Standing;Walking    How long can you sit comfortably?  unlimited    How long can you stand comfortably?  unlimited    How long can you walk comfortably?  takes frequent standing rest breaks during gait     Patient Stated Goals  Walk without an AD and improve his balance to increase safety with functional mobility     Currently in Pain?  No/denies                       [facility] Adult PT Treatment/Exercise - 05/14/18 0905      Ambulation/Gait   Ambulation/Gait  Yes    Ambulation/Gait Assistance  4: Min guard    Ambulation/Gait Assistance Details  decreased speed today, increased R foot drag and increased rest breaks today    Ambulation Distance (Feet)  50 Feet    Assistive device  Straight cane    Gait Pattern  Poor foot clearance - right    Ambulation Surface  Level;Indoor      Knee/Hip Exercises: Standing   Lateral Step Up  Right;Left;2 sets;5 reps;Hand Hold: 2;Step Height: 4"    Forward Step Up  Right;Left;2 sets;5 reps;Hand Hold: 2;Step Height: 4"    Forward Step Up Limitations  against resistance of blue theraband pulling pt backwards    Other  Standing Knee Exercises  Tall kneeling for hamstring training with 10 reps each: forward ball roll outs, forward ball roll out with single UE lift (alternating), diagonal roll outs with weight shifting to single leg             PT Education - 05/14/18 1333    Education provided  Yes    Education Details  added more visits    Person(s) Educated  Patient    Methods  Explanation    Comprehension  Verbalized understanding       PT Short Term Goals - 04/30/18 1100      PT SHORT TERM GOAL #1   Title  Pt will participate in establishment of initial HEP to improve balance and safety with functional mobility    Baseline  04/28/18: met today     Status  Achieved      PT SHORT TERM GOAL #2   Title  Pt will improve Berg score to >/=42/56 indicating reduction in fall risk  potential and improvement with balance     Baseline  04/28/18: 38/56    Status  Not Met      PT SHORT TERM GOAL #3   Title  Pt will improve gait speed to >/=2.64 ft/sec with his cane and R AFO demonstrating improvement in functional mobility    Baseline  decreased to 1.24 ft/sec    Time  4    Period  Weeks    Status  Not Met      PT SHORT TERM GOAL #4   Title  Pt will demonstrate improved LE strength as indicated by decrease in five time sit to stand < or = 13 seconds with no UE assist from a standard height chair.     Baseline  04/28/18: 13.47 from standard height chair using UE assist.     Status  Partially Met      PT SHORT TERM GOAL #5   Title  Pt will ambulate 230 feet indoors with his cane and R AFO on level surfaces and therapist providing supervision indicating improvement in functional mobility     Baseline  230' with cane with standing rest breaks with supervision    Time  4    Period  Weeks    Status  Achieved        PT Long Term Goals - 04/30/18 1110      PT LONG TERM GOAL #1   Title  Pt will be independent with his HEP to improve balance and functional mobility     Time  8    Period  Weeks    Status  New    Target Date  05/27/18      PT LONG TERM GOAL #2   Title  Pt will ambulate 250' outside on pavement and negotiate curb with supervision with AFO and cane to improve safety with community distances    Time  8    Period  Weeks    Status  Revised    Target Date  05/27/18      PT LONG TERM GOAL #3   Title  Pt will improve Berg score to >/= 42/56 indicating reduction in fall risk potential     Time  8    Period  Weeks    Status  Revised    Target Date  05/27/18      PT LONG TERM GOAL #4   Title  Pt will improve gait speed with his cane and R AFO to >/=  2.6 ft/sec indicative of improvement in functional mobility     Time  8    Period  Weeks    Status  Revised    Target Date  05/27/18            Plan - 05/14/18 1336    Clinical Impression  Statement  Continued hamstring strengthening and weight shift training in tall kneeling with therapy ball roll outs with pt demonstrating improved core activation.  Continued resisted weight shifting and stepping with forwards and lateral step ups on 4" step.  Pt fatigued quickly but was able to complete 10 reps before requiring rest breaks.  Added more visits and plan to recertify pt after next week to include aquatic therapy.    Rehab Potential  Good    Clinical Impairments Affecting Rehab Potential  Progressive nature of MS dx    PT Frequency  2x / week    PT Duration  8 weeks    PT Treatment/Interventions  ADLs/Self Care Home Management;Gait training;Stair training;Functional mobility training;Therapeutic activities;Therapeutic exercise;Balance training;Neuromuscular re-education;Passive range of motion;Manual techniques;Patient/family education;Electrical Stimulation;DME Instruction;Energy conservation    PT Next Visit Plan  Tall kneeling against resistance of theraband, weight shifting with stepping ant/post/lat in tall kneeling. Half kneeling/standing/gait/step ups against resistance to increase postural control during UE and LE movement.  Hamstring activation.      PT Home Exercise Plan  Access Code: [email or web address]     Consulted and Agree with Plan of Care  Patient;Family member/caregiver    Family Member Consulted  mother       Patient will benefit from skilled therapeutic intervention in order to improve the following deficits and impairments:  Abnormal gait, Decreased range of motion, Decreased balance, Decreased mobility, Difficulty walking, Decreased activity tolerance, Decreased strength, Decreased endurance, Impaired flexibility  Visit Diagnosis: Difficulty in walking, not elsewhere classified  Muscle weakness (generalized)  Unsteadiness on feet  Foot drop, right     Problem List Patient Active Problem List   Diagnosis Date Noted  . Vitamin D insufficiency 11/29/2016  .  Hypogonadism in male 11/01/2016  . Type 1 diabetes mellitus ([place])   . Hypothyroidism   . Hypertension   . Hyperlipidemia   . Closed fracture of right fibula and tibia 10/29/2016  . Closed tibia fracture 10/29/2016  . Closed fracture of right tibial plafond with fibula involvement 10/29/2016  . Abnormal liver function tests 03/06/2013  . RASH AND OTHER NONSPECIFIC SKIN ERUPTION 10/07/2009  . SHOULDER PAIN, LEFT 12/27/2008  . Type I (juvenile type) diabetes mellitus without mention of complication, uncontrolled 12/03/2007  . HYPOTHYROIDISM 11/12/2007  . HYPERCHOLESTEROLEMIA 11/12/2007  . MULTIPLE SCLEROSIS 08/29/2007  . PROLIFERATIVE DIABETIC RETINOPATHY 08/29/2007  . HYPERTENSION 12/25/2006    [name], PT, DPT 05/14/18    1:43 PM    [facility] [address] [place], [place], [address] Phone: [phone]   Fax:  [phone]  Name: Johnathan Ross MRN: [id number] Date of Birth: March 12, 1971

## 2018-05-17 ENCOUNTER — Telehealth: Payer: Self-pay | Admitting: Physical Therapy

## 2018-05-17 NOTE — Telephone Encounter (Signed)
Hello Dr. Dwyane Dee, Ric has been working with physical therapy and we are interested in adding aquatic therapy to his plan of care.  Do you have any concerns with him participating in aquatic therapy with an insulin pump?  Would he be safe to remove it for aquatic for >45 minutes?  Also, is there anything we need to cover with tegaderm before he submerges?  Thank you for your guidance, [name], PT, DPT 05/17/18    7:05 PM

## 2018-05-18 NOTE — Telephone Encounter (Signed)
What is the intensity of exercise he will do?  He can disconnect his pump but will need to check his blood sugar before going to the food and address it accordingly.  His glucose sensor usually waterproof and should not require any cover

## 2018-05-19 ENCOUNTER — Ambulatory Visit: Payer: Medicare Other | Admitting: Physical Therapy

## 2018-05-19 ENCOUNTER — Encounter: Payer: Self-pay | Admitting: Physical Therapy

## 2018-05-19 DIAGNOSIS — M21371 Foot drop, right foot: Secondary | ICD-10-CM

## 2018-05-19 DIAGNOSIS — R262 Difficulty in walking, not elsewhere classified: Secondary | ICD-10-CM

## 2018-05-19 DIAGNOSIS — M6281 Muscle weakness (generalized): Secondary | ICD-10-CM | POA: Diagnosis not present

## 2018-05-19 DIAGNOSIS — R2681 Unsteadiness on feet: Secondary | ICD-10-CM | POA: Diagnosis not present

## 2018-05-19 NOTE — Therapy (Signed)
[facility] [address] [place], [place], [address] Phone: [phone]   Fax:  [phone]  Physical Therapy Treatment  Patient Details  Name: Johnathan Ross MRN: [id number] Date of Birth: October 29, 1970 Referring Provider (PT): Dr. Hyman Bower   Encounter Date: 05/19/2018  PT End of Session - 05/19/18 0940    Visit Number  15    Number of Visits  17    Date for PT Re-Evaluation  05/27/18    Authorization Type  Medicare Part A and B- * Requires 10th visit PN    PT Start Time  0847    PT Stop Time  0932    PT Time Calculation (min)  45 min    Activity Tolerance  Patient tolerated treatment well    Behavior During Therapy  [facility] for tasks assessed/performed       Past Medical History:  Diagnosis Date  . Hyperlipidemia   . Hypertension   . Hypogonadism in male 11/01/2016  . Hypothyroidism   . Multiple sclerosis ([place])   . 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 Type 1 diabetes mellitus ([place]) dx'd 1994  . Vitamin D insufficiency 11/29/2016    Past Surgical History:  Procedure Laterality Date  . EYE SURGERY Bilateral    "laser for diabetic retinopathy"  . FRACTURE SURGERY    . OPEN REDUCTION INTERNAL FIXATION (ORIF) TIBIA/FIBULA FRACTURE Right 10/30/2016  . ORIF ANKLE FRACTURE Left 2015  . ORIF TIBIA FRACTURE Right 10/30/2016   Procedure: OPEN REDUCTION INTERNAL FIXATION (ORIF) TIBIA FIBULA FRACTURE;  Surgeon: Altamese Judith Basin, MD;  Location: [facility];  Service: Orthopedics;  Laterality: Right;  . RETINAL LASER PROCEDURE Bilateral    "for diabetic retinopathy"    There were no vitals filed for this visit.  Subjective Assessment - 05/19/18 0852    Subjective  Doing well this morning; LE moving better.  Discussed Endocrinologist recommendations.    Patient is accompained by:  Family member    Pertinent History  MS (dx 6 years prior), Type 1 DM, HLD, HTN, Hypogonadism in male, Hypothyroidism, Proliferative Diabetic  Retinopathy, Vitamin D Insufficiency     Limitations  Lifting;Standing;Walking    How long can you sit comfortably?  unlimited    How long can you stand comfortably?  unlimited    How long can you walk comfortably?  takes frequent standing rest breaks during gait     Patient Stated Goals  Walk without an AD and improve his balance to increase safety with functional mobility     Currently in Pain?  No/denies                       [facility] Adult PT Treatment/Exercise - 05/19/18 0916      Ambulation/Gait   Ambulation/Gait  Yes    Ambulation/Gait Assistance  5: Supervision    Ambulation/Gait Assistance Details  // bars resisted forwards and backwards walking x 4 reps against resistance of blue theraband focusing on forward weight shift and increased hamstring activation    Assistive device  Parallel bars      Therapeutic Activites    Therapeutic Activities  Other Therapeutic Activities    Other Therapeutic Activities  reviewed endocrinologist's recommendations for aquatic therapy regarding insulin pump and monitoring CBG before and afterwards.  Pt verbalized understanding.  Discussed aquatic visits schedule and reviewed sheet, "what to expect" with pt and mother.      Knee/Hip Exercises: Standing   Lateral Step Up  Right;Left;2 sets;10  reps;Hand Hold: 2;Step Height: 4"    Lateral Step Up Limitations  in // bars against resistance of blue theraband pulling opposite direction    Forward Step Up  Right;Left;10 reps;Hand Hold: 2;Step Height: 4";1 set    Forward Step Up Limitations  in // bars against resistance of blue theraband pulling opposite direction          Balance Exercises - 05/19/18 0938      Balance Exercises: Standing   Tandem Stance  Eyes open;Intermittent upper extremity support;1 rep;10 secs   in // bars against resistance of blue theraband pulling post       PT Education - 05/19/18 0940    Education provided  Yes    Education Details  See TA     Person(s) Educated  Patient;Parent(s)    Methods  Explanation    Comprehension  Verbalized understanding       PT Short Term Goals - 04/30/18 1100      PT SHORT TERM GOAL #1   Title  Pt will participate in establishment of initial HEP to improve balance and safety with functional mobility    Baseline  04/28/18: met today     Status  Achieved      PT SHORT TERM GOAL #2   Title  Pt will improve Berg score to >/=42/56 indicating reduction in fall risk potential and improvement with balance     Baseline  04/28/18: 38/56    Status  Not Met      PT SHORT TERM GOAL #3   Title  Pt will improve gait speed to >/=2.64 ft/sec with his cane and R AFO demonstrating improvement in functional mobility    Baseline  decreased to 1.24 ft/sec    Time  4    Period  Weeks    Status  Not Met      PT SHORT TERM GOAL #4   Title  Pt will demonstrate improved LE strength as indicated by decrease in five time sit to stand < or = 13 seconds with no UE assist from a standard height chair.     Baseline  04/28/18: 13.47 from standard height chair using UE assist.     Status  Partially Met      PT SHORT TERM GOAL #5   Title  Pt will ambulate 230 feet indoors with his cane and R AFO on level surfaces and therapist providing supervision indicating improvement in functional mobility     Baseline  230' with cane with standing rest breaks with supervision    Time  4    Period  Weeks    Status  Achieved        PT Long Term Goals - 04/30/18 1110      PT LONG TERM GOAL #1   Title  Pt will be independent with his HEP to improve balance and functional mobility     Time  8    Period  Weeks    Status  New    Target Date  05/27/18      PT LONG TERM GOAL #2   Title  Pt will ambulate 250' outside on pavement and negotiate curb with supervision with AFO and cane to improve safety with community distances    Time  8    Period  Weeks    Status  Revised    Target Date  05/27/18      PT LONG TERM GOAL #3   Title   Pt will improve Merrilee Jansky  score to >/= 42/56 indicating reduction in fall risk potential     Time  8    Period  Weeks    Status  Revised    Target Date  05/27/18      PT LONG TERM GOAL #4   Title  Pt will improve gait speed with his cane and R AFO to >/= 2.6 ft/sec indicative of improvement in functional mobility     Time  8    Period  Weeks    Status  Revised    Target Date  05/27/18            Plan - 05/19/18 0941    Clinical Impression Statement  Reviewed aquatic schedule for the new year and recommendations to prevent hypoglycemia with aquatic therapy since pt will not be wearing insulin pump.  Returned to forwards and lateral resisted step ups today but in // bars where pt had more appropriate height UE support; added in SLS at top of lateral step ups.  Also continued to perform resisted walking forwards, retro and static staggered stance with decreased UE support to focus on increased activation and use of pelvis and LE for weight shift and stability in stance.  Pt tolerated well.  Will continue to progress towards LTG.    Rehab Potential  Good    Clinical Impairments Affecting Rehab Potential  Progressive nature of MS dx    PT Frequency  2x / week    PT Duration  8 weeks    PT Treatment/Interventions  ADLs/Self Care Home Management;Gait training;Stair training;Functional mobility training;Therapeutic activities;Therapeutic exercise;Balance training;Neuromuscular re-education;Passive range of motion;Manual techniques;Patient/family education;Electrical Stimulation;DME Instruction;Energy conservation    PT Next Visit Plan  BEGIN TO CHECK LTG AND REVIEW HEP.  SEND TO ME FOR RECERT AFTER 62/56 VISIT. I SEE HIM ON 1/3.  Tall kneeling against resistance of theraband, weight shifting with stepping ant/post/lat in tall kneeling. Half kneeling/standing/gait/step ups against resistance to increase postural control during UE and LE movement.  Hamstring activation.      PT Home Exercise Plan   Access Code: [email or web address]     Consulted and Agree with Plan of Care  Patient;Family member/caregiver    Family Member Consulted  mother       Patient will benefit from skilled therapeutic intervention in order to improve the following deficits and impairments:  Abnormal gait, Decreased range of motion, Decreased balance, Decreased mobility, Difficulty walking, Decreased activity tolerance, Decreased strength, Decreased endurance, Impaired flexibility  Visit Diagnosis: Difficulty in walking, not elsewhere classified  Muscle weakness (generalized)  Unsteadiness on feet  Foot drop, right     Problem List Patient Active Problem List   Diagnosis Date Noted  . Vitamin D insufficiency 11/29/2016  . Hypogonadism in male 11/01/2016  . Type 1 diabetes mellitus ([place])   . Hypothyroidism   . Hypertension   . Hyperlipidemia   . Closed fracture of right fibula and tibia 10/29/2016  . Closed tibia fracture 10/29/2016  . Closed fracture of right tibial plafond with fibula involvement 10/29/2016  . Abnormal liver function tests 03/06/2013  . RASH AND OTHER NONSPECIFIC SKIN ERUPTION 10/07/2009  . SHOULDER PAIN, LEFT 12/27/2008  . Type I (juvenile type) diabetes mellitus without mention of complication, uncontrolled 12/03/2007  . HYPOTHYROIDISM 11/12/2007  . HYPERCHOLESTEROLEMIA 11/12/2007  . MULTIPLE SCLEROSIS 08/29/2007  . PROLIFERATIVE DIABETIC RETINOPATHY 08/29/2007  . HYPERTENSION 12/25/2006    [name], PT, DPT 05/19/18    9:46 AM    [facility]  [place], [place], [address] Phone: [phone]   Fax:  [phone]  Name: EDON HOADLEY MRN: [id number] Date of Birth: [birth year]

## 2018-05-23 ENCOUNTER — Encounter: Payer: Self-pay | Admitting: Physical Therapy

## 2018-05-23 ENCOUNTER — Ambulatory Visit: Payer: Medicare Other | Admitting: Physical Therapy

## 2018-05-23 DIAGNOSIS — M21371 Foot drop, right foot: Secondary | ICD-10-CM | POA: Diagnosis not present

## 2018-05-23 DIAGNOSIS — R262 Difficulty in walking, not elsewhere classified: Secondary | ICD-10-CM | POA: Diagnosis not present

## 2018-05-23 DIAGNOSIS — M6281 Muscle weakness (generalized): Secondary | ICD-10-CM

## 2018-05-23 DIAGNOSIS — R2681 Unsteadiness on feet: Secondary | ICD-10-CM

## 2018-05-23 NOTE — Therapy (Signed)
[facility] [address] [place], [place], [address] Phone: [phone]   Fax:  [phone]  Physical Therapy Treatment  Patient Details  Name: Johnathan Ross MRN: [id number] Date of Birth: 10/25/1970 Referring Provider (PT): Dr. Hyman Bower   Encounter Date: 05/23/2018  PT End of Session - 05/23/18 0853    Visit Number  16    Number of Visits  17    Date for PT Re-Evaluation  05/27/18    Authorization Type  Medicare Part A and B- * Requires 10th visit PN    PT Start Time  0848    PT Stop Time  0927    PT Time Calculation (min)  39 min    Equipment Utilized During Treatment  Gait belt    Activity Tolerance  Patient tolerated treatment well    Behavior During Therapy  [facility] for tasks assessed/performed       Past Medical History:  Diagnosis Date  . Hyperlipidemia   . Hypertension   . Hypogonadism in male 11/01/2016  . Hypothyroidism   . Multiple sclerosis ([place])   . 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 Type 1 diabetes mellitus ([place]) dx'd 1994  . Vitamin D insufficiency 11/29/2016    Past Surgical History:  Procedure Laterality Date  . EYE SURGERY Bilateral    "laser for diabetic retinopathy"  . FRACTURE SURGERY    . OPEN REDUCTION INTERNAL FIXATION (ORIF) TIBIA/FIBULA FRACTURE Right 10/30/2016  . ORIF ANKLE FRACTURE Left 2015  . ORIF TIBIA FRACTURE Right 10/30/2016   Procedure: OPEN REDUCTION INTERNAL FIXATION (ORIF) TIBIA FIBULA FRACTURE;  Surgeon: Altamese Levering, MD;  Location: [facility];  Service: Orthopedics;  Laterality: Right;  . RETINAL LASER PROCEDURE Bilateral    "for diabetic retinopathy"    There were no vitals filed for this visit.  Subjective Assessment - 05/23/18 0852    Subjective  No new complaints. No falls or pain to report.     Patient is accompained by:  Family member    Pertinent History  MS (dx 6 years prior), Type 1 DM, HLD, HTN, Hypogonadism in male, Hypothyroidism,  Proliferative Diabetic Retinopathy, Vitamin D Insufficiency     Limitations  Lifting;Standing;Walking    How long can you sit comfortably?  unlimited    How long can you stand comfortably?  unlimited    How long can you walk comfortably?  takes frequent standing rest breaks during gait     Currently in Pain?  No/denies    Pain Score  0-No pain         [facility] PT Assessment - 05/23/18 0854      Standardized Balance Assessment   Standardized Balance Assessment  Berg Balance Test;10 meter walk test    10 Meter Walk  13.0 sec's= 2.52 ft/sec with cane/right AFO, min guard assist for balance      Berg Balance Test   Sit to Stand  Able to stand without using hands and stabilize independently    Standing Unsupported  Able to stand safely 2 minutes    Sitting with Back Unsupported but Feet Supported on Floor or Stool  Able to sit safely and securely 2 minutes    Stand to Sit  Sits safely with minimal use of hands    Transfers  Able to transfer safely, minor use of hands    Standing Unsupported with Eyes Closed  Able to stand 10 seconds with supervision    Standing Ubsupported with Feet Together  Able to  place feet together independently and stand for 1 minute with supervision    From Standing, Reach Forward with Outstretched Arm  Can reach confidently >25 cm (10")   >10 inches   From Standing Position, Pick up Object from [place] to pick up shoe safely and easily    From Standing Position, Turn to Look Behind Over each Shoulder  Looks behind from both sides and weight shifts well    Turn 360 Degrees  Needs close supervision or verbal cueing    Standing Unsupported, Alternately Place Feet on Step/Stool  Able to complete >2 steps/needs minimal assist    Standing Unsupported, One Foot in [place] to plae foot ahead of the other independently and hold 30 seconds    Standing on One Leg  Able to lift leg independently and hold equal to or more than 3 seconds    Total Score  45    Berg  comment:  45/56= significant risk for falls           [facility] Adult PT Treatment/Exercise - 05/23/18 0921      Transfers   Transfers  Sit to Stand;Stand to Sit    Sit to Stand  5: Supervision;Without upper extremity assist    Stand to Sit  5: Supervision;Without upper extremity assist      Ambulation/Gait   Ambulation/Gait  Yes    Ambulation/Gait Assistance  4: Min guard;4: Min assist;5: Supervision    Ambulation/Gait Assistance Details  pt varied in assist needed with gait with one forward loss of balance on outdoor paved surfaces needing min assist to regain., otherwise supervision to min guard assist.     Ambulation Distance (Feet)  285 Feet   x1, plus in/out/around gym with activity   Assistive device  Straight cane    Gait Pattern  Step-through pattern;Poor foot clearance - right    Ambulation Surface  Level;Unlevel;Indoor;Outdoor;Paved    Curb  4: Min assist    Curb Details (indicate cue type and reason)  with cane/brace on outdoor curb. min guard to descend, min assist to ascend.            PT Short Term Goals - 04/30/18 1100      PT SHORT TERM GOAL #1   Title  Pt will participate in establishment of initial HEP to improve balance and safety with functional mobility    Baseline  04/28/18: met today     Status  Achieved      PT SHORT TERM GOAL #2   Title  Pt will improve Berg score to >/=42/56 indicating reduction in fall risk potential and improvement with balance     Baseline  04/28/18: 38/56    Status  Not Met      PT SHORT TERM GOAL #3   Title  Pt will improve gait speed to >/=2.64 ft/sec with his cane and R AFO demonstrating improvement in functional mobility    Baseline  decreased to 1.24 ft/sec    Time  4    Period  Weeks    Status  Not Met      PT SHORT TERM GOAL #4   Title  Pt will demonstrate improved LE strength as indicated by decrease in five time sit to stand < or = 13 seconds with no UE assist from a standard height chair.     Baseline  04/28/18:  13.47 from standard height chair using UE assist.     Status  Partially Met  PT SHORT TERM GOAL #5   Title  Pt will ambulate 230 feet indoors with his cane and R AFO on level surfaces and therapist providing supervision indicating improvement in functional mobility     Baseline  230' with cane with standing rest breaks with supervision    Time  4    Period  Weeks    Status  Achieved        PT Long Term Goals - 05/23/18 2119      PT LONG TERM GOAL #1   Title  Pt will be independent with his HEP to improve balance and functional mobility     Baseline  05/23/18: met today with updated HEP    Status  Achieved      PT LONG TERM GOAL #2   Title  Pt will ambulate 250' outside on pavement and negotiate curb with supervision with AFO and cane to improve safety with community distances    Baseline  12/27/9: met the distance with up to min assist for one loss of balance, otherwise supervision to min guard assist    Time  --    Period  --    Status  Partially Met      PT LONG TERM GOAL #3   Title  Pt will improve Berg score to >/= 42/56 indicating reduction in fall risk potential     Baseline  05/23/18: 45/56 scored today    Time  --    Period  --    Status  Achieved      PT LONG TERM GOAL #4   Title  Pt will improve gait speed with his cane and R AFO to >/= 2.6 ft/sec indicative of improvement in functional mobility     Baseline  05/23/18: 2.52 ft/sec with cane/right AFO. improved just not to goal.     Time  --    Period  --    Status  Partially Met            Plan - 05/23/18 0853    Clinical Impression Statement  Today's skilled session focused on progress toward LTGs with Berg Balance Test goal and HEP goal met. Remaining two goals where partially met. Primary PT plans to recert. Will continue to address strengthening and balance at next session.     Rehab Potential  Good    Clinical Impairments Affecting Rehab Potential  Progressive nature of MS dx    PT Frequency  2x  / week    PT Duration  8 weeks    PT Treatment/Interventions  ADLs/Self Care Home Management;Gait training;Stair training;Functional mobility training;Therapeutic activities;Therapeutic exercise;Balance training;Neuromuscular re-education;Passive range of motion;Manual techniques;Patient/family education;Electrical Stimulation;DME Instruction;Energy conservation    PT Next Visit Plan  Tall kneeling against resistance of theraband, weight shifting with stepping ant/post/lat in tall kneeling. Half kneeling/standing/gait/step ups against resistance to increase postural control during UE and LE movement.  Hamstring activation.      PT Home Exercise Plan  Access Code: [email or web address]     Consulted and Agree with Plan of Care  Patient;Family member/caregiver    Family Member Consulted  mother       Patient will benefit from skilled therapeutic intervention in order to improve the following deficits and impairments:  Abnormal gait, Decreased range of motion, Decreased balance, Decreased mobility, Difficulty walking, Decreased activity tolerance, Decreased strength, Decreased endurance, Impaired flexibility  Visit Diagnosis: Difficulty in walking, not elsewhere classified  Muscle weakness (generalized)  Unsteadiness on feet     Problem List  Patient Active Problem List   Diagnosis Date Noted  . Vitamin D insufficiency 11/29/2016  . Hypogonadism in male 11/01/2016  . Type 1 diabetes mellitus ([place])   . Hypothyroidism   . Hypertension   . Hyperlipidemia   . Closed fracture of right fibula and tibia 10/29/2016  . Closed tibia fracture 10/29/2016  . Closed fracture of right tibial plafond with fibula involvement 10/29/2016  . Abnormal liver function tests 03/06/2013  . RASH AND OTHER NONSPECIFIC SKIN ERUPTION 10/07/2009  . SHOULDER PAIN, LEFT 12/27/2008  . Type I (juvenile type) diabetes mellitus without mention of complication, uncontrolled 12/03/2007  . HYPOTHYROIDISM 11/12/2007  .  HYPERCHOLESTEROLEMIA 11/12/2007  . MULTIPLE SCLEROSIS 08/29/2007  . PROLIFERATIVE DIABETIC RETINOPATHY 08/29/2007  . HYPERTENSION 12/25/2006    Willow Ora, PTA, [facility] [address], [place], [place] [address] [phone] 05/23/18, 2:36 PM   Name: Johnathan Ross MRN: [id number] Date of Birth: [birth year]

## 2018-05-26 ENCOUNTER — Ambulatory Visit: Payer: Medicare Other | Admitting: Physical Therapy

## 2018-05-26 ENCOUNTER — Encounter: Payer: Self-pay | Admitting: Physical Therapy

## 2018-05-26 DIAGNOSIS — R2681 Unsteadiness on feet: Secondary | ICD-10-CM | POA: Diagnosis not present

## 2018-05-26 DIAGNOSIS — M6281 Muscle weakness (generalized): Secondary | ICD-10-CM

## 2018-05-26 DIAGNOSIS — M21371 Foot drop, right foot: Secondary | ICD-10-CM | POA: Diagnosis not present

## 2018-05-26 DIAGNOSIS — R262 Difficulty in walking, not elsewhere classified: Secondary | ICD-10-CM | POA: Diagnosis not present

## 2018-05-26 NOTE — Therapy (Signed)
[facility] [address] [place], [place], [address] Phone: [phone]   Fax:  [phone]  Physical Therapy Treatment  Patient Details  Name: Johnathan Ross MRN: [id number] Date of Birth: [birth year] Referring Provider (PT): Dr. Hyman Bower   Encounter Date: 05/26/2018  PT End of Session - 05/26/18 0854    Visit Number  17    Number of Visits  17    Date for PT Re-Evaluation  05/27/18    Authorization Type  Medicare Part A and B- * Requires 10th visit PN    PT Start Time  0850    PT Stop Time  0930    PT Time Calculation (min)  40 min    Equipment Utilized During Treatment  Gait belt    Activity Tolerance  Patient tolerated treatment well    Behavior During Therapy  [facility] for tasks assessed/performed       Past Medical History:  Diagnosis Date  . Hyperlipidemia   . Hypertension   . Hypogonadism in male 11/01/2016  . Hypothyroidism   . Multiple sclerosis ([place])   . 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 Type 1 diabetes mellitus ([place]) dx'd 1994  . Vitamin D insufficiency 11/29/2016    Past Surgical History:  Procedure Laterality Date  . EYE SURGERY Bilateral    "laser for diabetic retinopathy"  . FRACTURE SURGERY    . OPEN REDUCTION INTERNAL FIXATION (ORIF) TIBIA/FIBULA FRACTURE Right 10/30/2016  . ORIF ANKLE FRACTURE Left 2015  . ORIF TIBIA FRACTURE Right 10/30/2016   Procedure: OPEN REDUCTION INTERNAL FIXATION (ORIF) TIBIA FIBULA FRACTURE;  Surgeon: Altamese [place], MD;  Location: [facility];  Service: Orthopedics;  Laterality: Right;  . RETINAL LASER PROCEDURE Bilateral    "for diabetic retinopathy"    There were no vitals filed for this visit.  Subjective Assessment - 05/26/18 0853    Subjective  No new complaints. No falls or pain to report.     Patient is accompained by:  Family member   mom in lobby   Pertinent History  MS (dx 6 years prior), Type 1 DM, HLD, HTN, Hypogonadism in male,  Hypothyroidism, Proliferative Diabetic Retinopathy, Vitamin D Insufficiency     Limitations  Lifting;Standing;Walking    How long can you sit comfortably?  unlimited    How long can you stand comfortably?  unlimited    How long can you walk comfortably?  takes frequent standing rest breaks during gait     Diagnostic tests  X-ray    Patient Stated Goals  Walk without an AD and improve his balance to increase safety with functional mobility     Currently in Pain?  No/denies    Pain Score  0-No pain           [facility] Adult PT Treatment/Exercise - 05/26/18 4158      Neuro Re-ed    Neuro Re-ed Details   for strengthening/NMR: tall kneeling on red mat on floor next to mat table- with blue band resistance at pelvis for mini squats x 10 reps, then fwd/bwd walking with resistance x4 laps, then lateral stepping left<>right with resistance for 4 laps. cues on posture, weight shifitng and occasional touch to mat table for balance; in parallel bars- resisted walking fwd/bwd/laterally with blue band for 4 laps each, light touch to bars for balance; with blue band resistance at hips- fwd step ups, then lateral step ups with 8 inch step, 10 reps each bil LE's, UE suport for balance on  bars; seated with feet across red beam- sit<>stands x 5 reps with min assist/intermittent UE assist, cues for full upright standing and for slow, controlled descent with sitting down.                PT Short Term Goals - 04/30/18 1100      PT SHORT TERM GOAL #1   Title  Pt will participate in establishment of initial HEP to improve balance and safety with functional mobility    Baseline  04/28/18: met today     Status  Achieved      PT SHORT TERM GOAL #2   Title  Pt will improve Berg score to >/=42/56 indicating reduction in fall risk potential and improvement with balance     Baseline  04/28/18: 38/56    Status  Not Met      PT SHORT TERM GOAL #3   Title  Pt will improve gait speed to >/=2.64 ft/sec with his  cane and R AFO demonstrating improvement in functional mobility    Baseline  decreased to 1.24 ft/sec    Time  4    Period  Weeks    Status  Not Met      PT SHORT TERM GOAL #4   Title  Pt will demonstrate improved LE strength as indicated by decrease in five time sit to stand < or = 13 seconds with no UE assist from a standard height chair.     Baseline  04/28/18: 13.47 from standard height chair using UE assist.     Status  Partially Met      PT SHORT TERM GOAL #5   Title  Pt will ambulate 230 feet indoors with his cane and R AFO on level surfaces and therapist providing supervision indicating improvement in functional mobility     Baseline  230' with cane with standing rest breaks with supervision    Time  4    Period  Weeks    Status  Achieved        PT Long Term Goals - 05/23/18 2703      PT LONG TERM GOAL #1   Title  Pt will be independent with his HEP to improve balance and functional mobility     Baseline  05/23/18: met today with updated HEP    Status  Achieved      PT LONG TERM GOAL #2   Title  Pt will ambulate 250' outside on pavement and negotiate curb with supervision with AFO and cane to improve safety with community distances    Baseline  12/27/9: met the distance with up to min assist for one loss of balance, otherwise supervision to min guard assist    Time  --    Period  --    Status  Partially Met      PT LONG TERM GOAL #3   Title  Pt will improve Berg score to >/= 42/56 indicating reduction in fall risk potential     Baseline  05/23/18: 45/56 scored today    Time  --    Period  --    Status  Achieved      PT LONG TERM GOAL #4   Title  Pt will improve gait speed with his cane and R AFO to >/= 2.6 ft/sec indicative of improvement in functional mobility     Baseline  05/23/18: 2.52 ft/sec with cane/right AFO. improved just not to goal.     Time  --    Period  --  Status  Partially Met            Plan - 05/26/18 0854    Clinical Impression  Statement  Today's skilled session continued to focus on core/LE strengthening without any issues reported or noted in session. The pt is progressing toward goals and should benefit from continued PT to progress toward goals of cert (to be done by primary PT).     Rehab Potential  Good    Clinical Impairments Affecting Rehab Potential  Progressive nature of MS dx    PT Frequency  2x / week    PT Duration  8 weeks    PT Treatment/Interventions  ADLs/Self Care Home Management;Gait training;Stair training;Functional mobility training;Therapeutic activities;Therapeutic exercise;Balance training;Neuromuscular re-education;Passive range of motion;Manual techniques;Patient/family education;Electrical Stimulation;DME Instruction;Energy conservation    PT Next Visit Plan  Tall kneeling against resistance of theraband, weight shifting with stepping ant/post/lat in tall kneeling. Half kneeling/standing/gait/step ups against resistance to increase postural control during UE and LE movement.  Hamstring activation.      PT Home Exercise Plan  Access Code: [email or web address]     Consulted and Agree with Plan of Care  Patient;Family member/caregiver    Family Member Consulted  mother       Patient will benefit from skilled therapeutic intervention in order to improve the following deficits and impairments:  Abnormal gait, Decreased range of motion, Decreased balance, Decreased mobility, Difficulty walking, Decreased activity tolerance, Decreased strength, Decreased endurance, Impaired flexibility  Visit Diagnosis: Difficulty in walking, not elsewhere classified  Muscle weakness (generalized)  Unsteadiness on feet     Problem List Patient Active Problem List   Diagnosis Date Noted  . Vitamin D insufficiency 11/29/2016  . Hypogonadism in male 11/01/2016  . Type 1 diabetes mellitus ([place])   . Hypothyroidism   . Hypertension   . Hyperlipidemia   . Closed fracture of right fibula and tibia 10/29/2016  . Closed  tibia fracture 10/29/2016  . Closed fracture of right tibial plafond with fibula involvement 10/29/2016  . Abnormal liver function tests 03/06/2013  . RASH AND OTHER NONSPECIFIC SKIN ERUPTION 10/07/2009  . SHOULDER PAIN, LEFT 12/27/2008  . Type I (juvenile type) diabetes mellitus without mention of complication, uncontrolled 12/03/2007  . HYPOTHYROIDISM 11/12/2007  . HYPERCHOLESTEROLEMIA 11/12/2007  . MULTIPLE SCLEROSIS 08/29/2007  . PROLIFERATIVE DIABETIC RETINOPATHY 08/29/2007  . HYPERTENSION 12/25/2006    Willow Ora, PTA, [facility] [address], [place], [place] [address] [phone] 05/26/18, 11:31 AM   Name: Johnathan Ross MRN: [id number] Date of Birth: [birth year]

## 2018-05-28 NOTE — Addendum Note (Signed)
Addended by: Misty Stanley F on: 05/28/2018 11:17 AM   Modules accepted: Orders

## 2018-05-30 ENCOUNTER — Ambulatory Visit: Payer: Medicare Other | Attending: Urology | Admitting: Physical Therapy

## 2018-05-30 ENCOUNTER — Encounter: Payer: Self-pay | Admitting: Physical Therapy

## 2018-05-30 DIAGNOSIS — R262 Difficulty in walking, not elsewhere classified: Secondary | ICD-10-CM

## 2018-05-30 DIAGNOSIS — R2681 Unsteadiness on feet: Secondary | ICD-10-CM

## 2018-05-30 DIAGNOSIS — M21371 Foot drop, right foot: Secondary | ICD-10-CM

## 2018-05-30 DIAGNOSIS — M6281 Muscle weakness (generalized): Secondary | ICD-10-CM

## 2018-05-30 NOTE — Therapy (Signed)
[facility] [address] [place], [place], [address] Phone: [phone]   Fax:  [phone]  Physical Therapy Treatment  Patient Details  Name: Johnathan Ross MRN: [id number] Date of Birth: August 09, 1970 Referring Provider (PT): Dr. Hyman Bower   Encounter Date: 05/30/2018  PT End of Session - 05/30/18 1523    Visit Number  18    Number of Visits  33    Date for PT Re-Evaluation  07/27/18    Authorization Type  Medicare Part A and B- * Requires 10th visit PN    PT Start Time  0845    PT Stop Time  0934    PT Time Calculation (min)  49 min    Activity Tolerance  Patient tolerated treatment well    Behavior During Therapy  [facility] for tasks assessed/performed       Past Medical History:  Diagnosis Date  . Hyperlipidemia   . Hypertension   . Hypogonadism in male 11/01/2016  . Hypothyroidism   . Multiple sclerosis ([place])   . 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 Type 1 diabetes mellitus ([place]) dx'd 1994  . Vitamin D insufficiency 11/29/2016    Past Surgical History:  Procedure Laterality Date  . EYE SURGERY Bilateral    "laser for diabetic retinopathy"  . FRACTURE SURGERY    . OPEN REDUCTION INTERNAL FIXATION (ORIF) TIBIA/FIBULA FRACTURE Right 10/30/2016  . ORIF ANKLE FRACTURE Left 2015  . ORIF TIBIA FRACTURE Right 10/30/2016   Procedure: OPEN REDUCTION INTERNAL FIXATION (ORIF) TIBIA FIBULA FRACTURE;  Surgeon: Altamese [place], MD;  Location: [facility];  Service: Orthopedics;  Laterality: Right;  . RETINAL LASER PROCEDURE Bilateral    "for diabetic retinopathy"    There were no vitals filed for this visit.  Subjective Assessment - 05/30/18 0855    Subjective  Starts aquatic on Monday, no questions.  Checking goals went well.    Patient is accompained by:  Family member   mom in lobby   Pertinent History  MS (dx 6 years prior), Type 1 DM, HLD, HTN, Hypogonadism in male, Hypothyroidism, Proliferative Diabetic  Retinopathy, Vitamin D Insufficiency     Limitations  Lifting;Standing;Walking    How long can you sit comfortably?  unlimited    How long can you stand comfortably?  unlimited    How long can you walk comfortably?  takes frequent standing rest breaks during gait     Diagnostic tests  X-ray    Patient Stated Goals  Walk without an AD and improve his balance to increase safety with functional mobility     Currently in Pain?  No/denies                       [facility] Adult PT Treatment/Exercise - 05/30/18 5284      Neuro Re-ed    Neuro Re-ed Details   NMR for core and hamstring strengthening and postural control in tall kneeling on red mat performed 2 sets x 12 reps latissimus/tricep UE extensions maintaining tall kneeling, rows maintaining tall kneeling and then combined with tall kneeling squats.  Also performed 10 reps each side resisted trunk rotation in tall kneeling against resistance of blue theraband.      Knee/Hip Exercises: Aerobic   Tread Mill  0.6 mph x 3 minutes forwards with therapist assisting with RLE clearance, advancement and full step length.  Pt had one episode of inability to step forwards and feet went off the back of treadmill but  pt able to maintain hand hold and did not fall.  Returned to treadmill uninjured and continued with gait backwards at 0.3 mph x 3 minutes with therapist providing facilitation at pelvis for weight shift and stance time.               PT Short Term Goals - 05/28/18 1102      PT SHORT TERM GOAL #1   Title  Pt will demonstrate independence with ongoing HEP    Time  4    Period  Weeks    Status  Revised    Target Date  06/27/18      PT SHORT TERM GOAL #2   Title  Pt will improve Berg score to >/=48/56 indicating reduction in fall risk potential and improvement with balance     Baseline  45/56    Time  4    Period  Weeks    Status  Revised    Target Date  06/27/18      PT SHORT TERM GOAL #3   Title  Pt will improve  gait speed to >/=2.64 ft/sec with his cane and R AFO demonstrating improvement in functional mobility    Baseline  2.5 ft/sec    Time  4    Period  Weeks    Status  Revised    Target Date  06/27/18      PT SHORT TERM GOAL #4   Title  Pt will ambulate x 250' outside over paved surfaces, up/down curb with cane/AFO and supervision    Time  4    Period  Weeks    Status  New    Target Date  06/27/18        PT Long Term Goals - 05/28/18 1113      PT LONG TERM GOAL #1   Title  Pt will be independent with final HEP (land and aquatic if pt has pool to utilize in) to improve balance and functional mobility     Time  8    Period  Weeks    Status  Revised    Target Date  07/27/18      PT LONG TERM GOAL #2   Title  Pt will ambulate 300' outside on pavement and negotiate curb with supervision with AFO and cane to improve safety with community distances    Time  8    Period  Weeks    Status  Revised    Target Date  07/27/18      PT LONG TERM GOAL #3   Title  Pt will improve Berg score to >/= 50/56 indicating reduction in fall risk potential     Time  8    Period  Weeks    Status  Revised    Target Date  07/27/18      PT LONG TERM GOAL #4   Title  Pt will improve gait speed with his cane and R AFO to >/= 2.8 ft/sec indicative of improvement in functional mobility     Time  8    Period  Weeks    Status  Revised    Target Date  07/27/18            Plan - 05/30/18 1524    Clinical Impression Statement  Continued to focus on core and proximal hip activation in tall kneeling incorporating bilat UE strengthening into extension, scapular retraction and depression.  Pt required increased rest breaks due to increased use of hamstring and core muscles  due to dynamic bilat UE movements.  Also incorporated trunk rotation into core strengthening.  Transitioned to treadmill training with pt experiencing one episode of LE catching on belt and coming off the back of the treadmill.  PT able to  stop treadmill before pt had a fall.  Returned to treadmill with pt performing backwards walking with therapist providing manual facilitation at pelvis with improved tolerance and performance on treadmill.  Pt to begin aquatic therapy on Monday and will continue with land therapy 1x/week.  Will continue to address impairments to progress towards LTG and decrease risk for falls.    Rehab Potential  Good    Clinical Impairments Affecting Rehab Potential  Progressive nature of MS dx    PT Frequency  2x / week    PT Duration  8 weeks    PT Treatment/Interventions  ADLs/Self Care Home Management;Gait training;Stair training;Functional mobility training;Therapeutic activities;Therapeutic exercise;Balance training;Neuromuscular re-education;Passive range of motion;Manual techniques;Patient/family education;Electrical Stimulation;DME Instruction;Energy conservation    PT Next Visit Plan  Aquatic therapy!!  Tall and half kneeling with resisted UE strengthening into extension, scapular retraction and depression, trunk rotation.  Tall kneeling ball roll outs for closed chain hamstring training.  Treadmill with therapist facilitating at trunk - go slow!    PT Home Exercise Plan  Access Code: [email or web address]     Consulted and Agree with Plan of Care  Patient       Patient will benefit from skilled therapeutic intervention in order to improve the following deficits and impairments:  Abnormal gait, Decreased range of motion, Decreased balance, Decreased mobility, Difficulty walking, Decreased activity tolerance, Decreased strength, Decreased endurance, Impaired flexibility  Visit Diagnosis: Difficulty in walking, not elsewhere classified  Muscle weakness (generalized)  Unsteadiness on feet  Foot drop, right     Problem List Patient Active Problem List   Diagnosis Date Noted  . Vitamin D insufficiency 11/29/2016  . Hypogonadism in male 11/01/2016  . Type 1 diabetes mellitus ([place])   . Hypothyroidism   .  Hypertension   . Hyperlipidemia   . Closed fracture of right fibula and tibia 10/29/2016  . Closed tibia fracture 10/29/2016  . Closed fracture of right tibial plafond with fibula involvement 10/29/2016  . Abnormal liver function tests 03/06/2013  . RASH AND OTHER NONSPECIFIC SKIN ERUPTION 10/07/2009  . SHOULDER PAIN, LEFT 12/27/2008  . Type I (juvenile type) diabetes mellitus without mention of complication, uncontrolled 12/03/2007  . HYPOTHYROIDISM 11/12/2007  . HYPERCHOLESTEROLEMIA 11/12/2007  . MULTIPLE SCLEROSIS 08/29/2007  . PROLIFERATIVE DIABETIC RETINOPATHY 08/29/2007  . HYPERTENSION 12/25/2006    [name], PT, DPT 05/30/18    3:31 PM    [facility] [address] [place], [place], [address] Phone: [phone]   Fax:  [phone]  Name: LAEL PILCH MRN: [id number] Date of Birth: 18-Mar-1971

## 2018-06-02 ENCOUNTER — Ambulatory Visit: Payer: Medicare Other | Admitting: Physical Therapy

## 2018-06-02 ENCOUNTER — Encounter: Payer: Self-pay | Admitting: Physical Therapy

## 2018-06-02 DIAGNOSIS — R2681 Unsteadiness on feet: Secondary | ICD-10-CM | POA: Diagnosis not present

## 2018-06-02 DIAGNOSIS — R262 Difficulty in walking, not elsewhere classified: Secondary | ICD-10-CM | POA: Diagnosis not present

## 2018-06-02 DIAGNOSIS — M6281 Muscle weakness (generalized): Secondary | ICD-10-CM | POA: Diagnosis not present

## 2018-06-02 DIAGNOSIS — M21371 Foot drop, right foot: Secondary | ICD-10-CM | POA: Diagnosis not present

## 2018-06-02 NOTE — Therapy (Signed)
[facility] [address] [place], [place], [address] Phone: [phone]   Fax:  [phone]  Physical Therapy Treatment  Patient Details  Name: Johnathan Ross MRN: [id number] Date of Birth: 17-Mar-1971 Referring Provider (PT): Dr. Hyman Bower   Encounter Date: 06/02/2018  PT End of Session - 06/02/18 1934    Visit Number  19    Number of Visits  33    Date for PT Re-Evaluation  07/27/18    Authorization Type  Medicare Part A and B- * Requires 10th visit PN    PT Start Time  1415    PT Stop Time  1500    PT Time Calculation (min)  45 min    Equipment Utilized During Treatment  Other (comment)   flotation belt used intermittently during session   Activity Tolerance  Patient tolerated treatment well    Behavior During Therapy  [facility] for tasks assessed/performed       Past Medical History:  Diagnosis Date  . Hyperlipidemia   . Hypertension   . Hypogonadism in male 11/01/2016  . Hypothyroidism   . Multiple sclerosis ([place])   . 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 Type 1 diabetes mellitus ([place]) dx'd 1994  . Vitamin D insufficiency 11/29/2016    Past Surgical History:  Procedure Laterality Date  . EYE SURGERY Bilateral    "laser for diabetic retinopathy"  . FRACTURE SURGERY    . OPEN REDUCTION INTERNAL FIXATION (ORIF) TIBIA/FIBULA FRACTURE Right 10/30/2016  . ORIF ANKLE FRACTURE Left 2015  . ORIF TIBIA FRACTURE Right 10/30/2016   Procedure: OPEN REDUCTION INTERNAL FIXATION (ORIF) TIBIA FIBULA FRACTURE;  Surgeon: Altamese [place], MD;  Location: [facility];  Service: Orthopedics;  Laterality: Right;  . RETINAL LASER PROCEDURE Bilateral    "for diabetic retinopathy"    There were no vitals filed for this visit.  Subjective Assessment - 06/02/18 1932    Subjective  Pt presents for aquatic therapy at [facility]; pt using RW for assistance with ambulation    Patient is accompained by:  Family member   mother   Pertinent  History  MS (dx 6 years prior), Type 1 DM, HLD, HTN, Hypogonadism in male, Hypothyroidism, Proliferative Diabetic Retinopathy, Vitamin D Insufficiency     Patient Stated Goals  Walk without an AD and improve his balance to increase safety with functional mobility     Currently in Pain?  No/denies       Aquatic therapy:  Pool temp 87.2 degrees  Patient seen for aquatic therapy today.  Treatment took place in water 3.5--4 feet deep depending upon activity.  Pt entered and exited  the pool via step negotiation with use of bil. Hand rails with CGA with descension and SBA with ascension.  Pt performed bil. Hip flexion, extension, and abduction in standing with UE support on pool edge - 10 reps each direction each leg Marching in place with UE support 10 reps each LE:  Attempted to perform marching without UE support but pt had LOB with RLE SLS When lifting LLE. Pt performed Rt knee flexion with mod assist to achieve full ROM - 10 reps for strengthening  Squats x 10 reps   Pt gait trained across length of pool (approx. 100') with use of Aqua jogger flotation belt placed in front of patient to facilitate upright posture and with pt holding  Onto noodle for support due to decreased balance with buoyancy and current of water  Pt performed sidestepping approx. 50' x  2 reps across pool with use of noodle for UE support   Ai Chi postures  - 3 postures performed to facilitate improved balance with weight shifting and turning and core stabilization  Pt performed runner's stretch for RLE for hamstring and heel cord stretching - 30 sec hold x 1 rep  Exercises performed in supine position (supported by PT with use of noodle under pt's arms) - hip abduction x 10 reps  Bicycling LE's 10 reps each leg  Closed chain hip and knee extension by pt placing feet on pool wall - 10 reps   Pt requires buoyancy for support with balance and viscosity for resistance for strengthening exercises                          PT Short Term Goals - 05/28/18 1102      PT SHORT TERM GOAL #1   Title  Pt will demonstrate independence with ongoing HEP    Time  4    Period  Weeks    Status  Revised    Target Date  06/27/18      PT SHORT TERM GOAL #2   Title  Pt will improve Berg score to >/=48/56 indicating reduction in fall risk potential and improvement with balance     Baseline  45/56    Time  4    Period  Weeks    Status  Revised    Target Date  06/27/18      PT SHORT TERM GOAL #3   Title  Pt will improve gait speed to >/=2.64 ft/sec with his cane and R AFO demonstrating improvement in functional mobility    Baseline  2.5 ft/sec    Time  4    Period  Weeks    Status  Revised    Target Date  06/27/18      PT SHORT TERM GOAL #4   Title  Pt will ambulate x 250' outside over paved surfaces, up/down curb with cane/AFO and supervision    Time  4    Period  Weeks    Status  New    Target Date  06/27/18        PT Long Term Goals - 05/28/18 1113      PT LONG TERM GOAL #1   Title  Pt will be independent with final HEP (land and aquatic if pt has pool to utilize in) to improve balance and functional mobility     Time  8    Period  Weeks    Status  Revised    Target Date  07/27/18      PT LONG TERM GOAL #2   Title  Pt will ambulate 300' outside on pavement and negotiate curb with supervision with AFO and cane to improve safety with community distances    Time  8    Period  Weeks    Status  Revised    Target Date  07/27/18      PT LONG TERM GOAL #3   Title  Pt will improve Berg score to >/= 50/56 indicating reduction in fall risk potential     Time  8    Period  Weeks    Status  Revised    Target Date  07/27/18      PT LONG TERM GOAL #4   Title  Pt will improve gait speed with his cane and R AFO to >/= 2.8 ft/sec indicative of improvement in  functional mobility     Time  8    Period  Weeks    Status  Revised    Target Date  07/27/18               Patient will benefit from skilled therapeutic intervention in order to improve the following deficits and impairments:     Visit Diagnosis: Muscle weakness (generalized)  Unsteadiness on feet  Difficulty in walking, not elsewhere classified     Problem List Patient Active Problem List   Diagnosis Date Noted  . Vitamin D insufficiency 11/29/2016  . Hypogonadism in male 11/01/2016  . Type 1 diabetes mellitus ([place])   . Hypothyroidism   . Hypertension   . Hyperlipidemia   . Closed fracture of right fibula and tibia 10/29/2016  . Closed tibia fracture 10/29/2016  . Closed fracture of right tibial plafond with fibula involvement 10/29/2016  . Abnormal liver function tests 03/06/2013  . RASH AND OTHER NONSPECIFIC SKIN ERUPTION 10/07/2009  . SHOULDER PAIN, LEFT 12/27/2008  . Type I (juvenile type) diabetes mellitus without mention of complication, uncontrolled 12/03/2007  . HYPOTHYROIDISM 11/12/2007  . HYPERCHOLESTEROLEMIA 11/12/2007  . MULTIPLE SCLEROSIS 08/29/2007  . PROLIFERATIVE DIABETIC RETINOPATHY 08/29/2007  . HYPERTENSION 12/25/2006    Alda Lea, PT 06/02/2018, 7:49 PM  [facility] [address] [place], [place], [address] Phone: [phone]   Fax:  [phone]  Name: Johnathan Ross MRN: [id number] Date of Birth: 13-Sep-1970

## 2018-06-04 ENCOUNTER — Ambulatory Visit: Payer: Medicare Other | Admitting: Physical Therapy

## 2018-06-04 ENCOUNTER — Encounter: Payer: Self-pay | Admitting: Physical Therapy

## 2018-06-04 DIAGNOSIS — R2681 Unsteadiness on feet: Secondary | ICD-10-CM | POA: Diagnosis not present

## 2018-06-04 DIAGNOSIS — M21371 Foot drop, right foot: Secondary | ICD-10-CM | POA: Diagnosis not present

## 2018-06-04 DIAGNOSIS — M6281 Muscle weakness (generalized): Secondary | ICD-10-CM | POA: Diagnosis not present

## 2018-06-04 DIAGNOSIS — R262 Difficulty in walking, not elsewhere classified: Secondary | ICD-10-CM | POA: Diagnosis not present

## 2018-06-04 NOTE — Therapy (Addendum)
[facility] [address] [place], [place], [address] Phone: [phone]   Fax:  [phone]  Physical Therapy Treatment and 10th visit PN  Patient Details  Name: Johnathan Ross MRN: [id number] Date of Birth: [birth year] Referring Provider (PT): Dr. Hyman Bower   Encounter Date: 06/04/2018  PT End of Session - 06/04/18 0856    Visit Number  20    Number of Visits  33    Date for PT Re-Evaluation  07/27/18    Authorization Type  Medicare Part A and B- * Requires 10th visit PN    PT Start Time  0848    PT Stop Time  0930    PT Time Calculation (min)  42 min    Equipment Utilized During Treatment  Gait belt    Activity Tolerance  Patient tolerated treatment well    Behavior During Therapy  [facility] for tasks assessed/performed       Past Medical History:  Diagnosis Date  . Hyperlipidemia   . Hypertension   . Hypogonadism in male 11/01/2016  . Hypothyroidism   . Multiple sclerosis ([place])   . 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 Type 1 diabetes mellitus ([place]) dx'd 1994  . Vitamin D insufficiency 11/29/2016    Past Surgical History:  Procedure Laterality Date  . EYE SURGERY Bilateral    "laser for diabetic retinopathy"  . FRACTURE SURGERY    . OPEN REDUCTION INTERNAL FIXATION (ORIF) TIBIA/FIBULA FRACTURE Right 10/30/2016  . ORIF ANKLE FRACTURE Left 2015  . ORIF TIBIA FRACTURE Right 10/30/2016   Procedure: OPEN REDUCTION INTERNAL FIXATION (ORIF) TIBIA FIBULA FRACTURE;  Surgeon: Altamese Brooklet, MD;  Location: [facility];  Service: Orthopedics;  Laterality: Right;  . RETINAL LASER PROCEDURE Bilateral    "for diabetic retinopathy"    There were no vitals filed for this visit.  Subjective Assessment - 06/04/18 0854    Subjective  Reports the pool was good, did not tire him out, blood sugar tolerated it. Walked into gym with cane today with 5 standing breaks and 2 small balance losses with min gaurd to min assist to  recover.     Patient is accompained by:  Family member   mother   Pertinent History  MS (dx 6 years prior), Type 1 DM, HLD, HTN, Hypogonadism in male, Hypothyroidism, Proliferative Diabetic Retinopathy, Vitamin D Insufficiency     Limitations  Lifting;Standing;Walking    How long can you sit comfortably?  unlimited    How long can you stand comfortably?  unlimited    How long can you walk comfortably?  takes frequent standing rest breaks during gait     Diagnostic tests  X-ray    Patient Stated Goals  Walk without an AD and improve his balance to increase safety with functional mobility     Currently in Pain?  No/denies    Pain Score  0-No pain          06/04/18 0858  Ambulation/Gait  Ambulation/Gait Yes  Ambulation/Gait Assistance 4: Min guard;4: Min assist  Ambulation/Gait Assistance Details several standing rest breaks needed with both entering and exiting the gym. loss of balance x 1-2 episodes due to toe catch, over weight shifitng needing min assist to regain.   Ambulation Distance (Feet) 80 Feet (x2)  Assistive device Straight cane  Gait Pattern Step-through pattern;Poor foot clearance - right  Ambulation Surface Level;Indoor  Neuro Re-ed   Neuro Re-ed Details  for strengthening/balance: in half kneeling with 4# weighted balll:  UE lifts x 10 reps each foot forward with min to mod assist for balance/stability, cues on weight shifting/positioning for balance; in tall kneeling with blue theraband resistand: mini squats x 20 reps with emphasis on full return to upright posture, no UE support with occasional touch to mat for balance; modified plank on forearms: lifting hips/knees up off mat for 5 sec holds for 5 reps with assist for ankle stability; in quadruped with green band around knees: fire hydranct for 15 reps each side with assist for trunk/core stability; seated at edge of mat table with green band around knees.: sit<>stands with emphasis on keeping band pulled tight for 5 reps  with UE assist, min guard assist for safety.          PT Short Term Goals - 05/28/18 1102      PT SHORT TERM GOAL #1   Title  Pt will demonstrate independence with ongoing HEP    Time  4    Period  Weeks    Status  Revised    Target Date  06/27/18      PT SHORT TERM GOAL #2   Title  Pt will improve Berg score to >/=48/56 indicating reduction in fall risk potential and improvement with balance     Baseline  45/56    Time  4    Period  Weeks    Status  Revised    Target Date  06/27/18      PT SHORT TERM GOAL #3   Title  Pt will improve gait speed to >/=2.64 ft/sec with his cane and R AFO demonstrating improvement in functional mobility    Baseline  2.5 ft/sec    Time  4    Period  Weeks    Status  Revised    Target Date  06/27/18      PT SHORT TERM GOAL #4   Title  Pt will ambulate x 250' outside over paved surfaces, up/down curb with cane/AFO and supervision    Time  4    Period  Weeks    Status  New    Target Date  06/27/18        PT Long Term Goals - 05/28/18 1113      PT LONG TERM GOAL #1   Title  Pt will be independent with final HEP (land and aquatic if pt has pool to utilize in) to improve balance and functional mobility     Time  8    Period  Weeks    Status  Revised    Target Date  07/27/18      PT LONG TERM GOAL #2   Title  Pt will ambulate 300' outside on pavement and negotiate curb with supervision with AFO and cane to improve safety with community distances    Time  8    Period  Weeks    Status  Revised    Target Date  07/27/18      PT LONG TERM GOAL #3   Title  Pt will improve Berg score to >/= 50/56 indicating reduction in fall risk potential     Time  8    Period  Weeks    Status  Revised    Target Date  07/27/18      PT LONG TERM GOAL #4   Title  Pt will improve gait speed with his cane and R AFO to >/= 2.8 ft/sec indicative of improvement in functional mobility     Time  8  Period  Weeks    Status  Revised    Target Date   07/27/18            Plan - 06/04/18 0857    Clinical Impression Statement  Today's skilled session continued to address strengthening with fatigue noted at end of session. Rest breaks taken throughout session did help this. The pt is progressing toward goals and should benefit from continued PT to progress toward unmet goals.     Rehab Potential  Good    Clinical Impairments Affecting Rehab Potential  Progressive nature of MS dx    PT Frequency  2x / week    PT Duration  8 weeks    PT Treatment/Interventions  ADLs/Self Care Home Management;Gait training;Stair training;Functional mobility training;Therapeutic activities;Therapeutic exercise;Balance training;Neuromuscular re-education;Passive range of motion;Manual techniques;Patient/family education;Electrical Stimulation;DME Instruction;Energy conservation    PT Next Visit Plan  aquatic therapy 1x a week; Tall and half kneeling with resisted UE strengthening into extension, scapular retraction and depression, trunk rotation.  Tall kneeling ball roll outs for closed chain hamstring training.  Treadmill with therapist facilitating at trunk - go slow!    PT Home Exercise Plan  Access Code: [email or web address]     Consulted and Agree with Plan of Care  Patient    Family Member Consulted  mother       Patient will benefit from skilled therapeutic intervention in order to improve the following deficits and impairments:  Abnormal gait, Decreased range of motion, Decreased balance, Decreased mobility, Difficulty walking, Decreased activity tolerance, Decreased strength, Decreased endurance, Impaired flexibility  Visit Diagnosis: Muscle weakness (generalized)  Unsteadiness on feet     Problem List Patient Active Problem List   Diagnosis Date Noted  . Vitamin D insufficiency 11/29/2016  . Hypogonadism in male 11/01/2016  . Type 1 diabetes mellitus ([place])   . Hypothyroidism   . Hypertension   . Hyperlipidemia   . Closed fracture of right fibula  and tibia 10/29/2016  . Closed tibia fracture 10/29/2016  . Closed fracture of right tibial plafond with fibula involvement 10/29/2016  . Abnormal liver function tests 03/06/2013  . RASH AND OTHER NONSPECIFIC SKIN ERUPTION 10/07/2009  . SHOULDER PAIN, LEFT 12/27/2008  . Type I (juvenile type) diabetes mellitus without mention of complication, uncontrolled 12/03/2007  . HYPOTHYROIDISM 11/12/2007  . HYPERCHOLESTEROLEMIA 11/12/2007  . MULTIPLE SCLEROSIS 08/29/2007  . PROLIFERATIVE DIABETIC RETINOPATHY 08/29/2007  . HYPERTENSION 12/25/2006    Willow Ora, PTA, [facility] [address] [place], [place] [address] [phone] 06/05/18, 12:13 PM    10th Visit Physical Therapy Progress Note  Dates of Reporting Period: 04/30/18 to 06/04/2018  Objective Reports: See above  Objective Measurements: See above  Goal Update: See new STG and LTG  Plan: Continue POC  Reason Skilled Services are Required: to continue to address impairments, decrease falls risk and maximize functional mobility independence.  [name], PT, DPT 06/11/18    2:02 PM     Name: Johnathan Ross MRN: [id number] Date of Birth: Jun 29, 1970

## 2018-06-09 ENCOUNTER — Encounter: Payer: Self-pay | Admitting: Physical Therapy

## 2018-06-09 ENCOUNTER — Ambulatory Visit: Payer: Medicare Other | Admitting: Physical Therapy

## 2018-06-09 DIAGNOSIS — M6281 Muscle weakness (generalized): Secondary | ICD-10-CM

## 2018-06-09 DIAGNOSIS — R2681 Unsteadiness on feet: Secondary | ICD-10-CM

## 2018-06-09 DIAGNOSIS — R262 Difficulty in walking, not elsewhere classified: Secondary | ICD-10-CM | POA: Diagnosis not present

## 2018-06-09 DIAGNOSIS — M21371 Foot drop, right foot: Secondary | ICD-10-CM | POA: Diagnosis not present

## 2018-06-09 NOTE — Therapy (Signed)
[facility] [address] [place], [place], [address] Phone: [phone]   Fax:  [phone]  Physical Therapy Treatment  Patient Details  Name: Johnathan Ross MRN: [id number] Date of Birth: [birth year] Referring Provider (PT): Dr. Hyman Bower   Encounter Date: 06/09/2018  PT End of Session - 06/09/18 2147    Visit Number  21    Number of Visits  33    Date for PT Re-Evaluation  07/27/18    Authorization Type  Medicare Part A and B- * Requires 10th visit PN    PT Start Time  1550    PT Stop Time  1635    PT Time Calculation (min)  45 min    Equipment Utilized During Treatment  --   water noodle used for support   Activity Tolerance  Patient tolerated treatment well    Behavior During Therapy  [facility] for tasks assessed/performed       Past Medical History:  Diagnosis Date  . Hyperlipidemia   . Hypertension   . Hypogonadism in male 11/01/2016  . Hypothyroidism   . Multiple sclerosis ([place])   . 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 Type 1 diabetes mellitus ([place]) dx'd 1994  . Vitamin D insufficiency 11/29/2016    Past Surgical History:  Procedure Laterality Date  . EYE SURGERY Bilateral    "laser for diabetic retinopathy"  . FRACTURE SURGERY    . OPEN REDUCTION INTERNAL FIXATION (ORIF) TIBIA/FIBULA FRACTURE Right 10/30/2016  . ORIF ANKLE FRACTURE Left 2015  . ORIF TIBIA FRACTURE Right 10/30/2016   Procedure: OPEN REDUCTION INTERNAL FIXATION (ORIF) TIBIA FIBULA FRACTURE;  Surgeon: Altamese , MD;  Location: [facility];  Service: Orthopedics;  Laterality: Right;  . RETINAL LASER PROCEDURE Bilateral    "for diabetic retinopathy"    There were no vitals filed for this visit.  Subjective Assessment - 06/09/18 2144    Subjective  Pt presents for aquatic therapy at [facility] - using RW to amb. from [place] to pool steps for entry into pool    Patient is accompained by:  --   parents   Pertinent History  MS (dx  6 years prior), Type 1 DM, HLD, HTN, Hypogonadism in male, Hypothyroidism, Proliferative Diabetic Retinopathy, Vitamin D Insufficiency     Patient Stated Goals  Walk without an AD and improve his balance to increase safety with functional mobility     Currently in Pain?  No/denies           Aquatic therapy:  Pool temp 87.2 degrees  Patient seen for aquatic therapy today.  Treatment took place in water 3.5-4 feet deep depending upon activity.  Pt entered and exited  the pool via step negotiation with use of bil. Hand rails with CGA with descension and min assist with ascension due to fatigue.    Performed runner's stretch and heel cord stretch - 30 sec hold each stretch each LE Pt performed bil. Hip flexion, extension, and abduction in standing with UE support on pool edge - 10 reps each direction each leg with ankle cuff buoyant weight for increased buoyancy/ resistance   With eccentric contraction Marching in place with UE support 10 reps each LE:  Attempted to perform marching without UE support but pt had LOB and needed to stabilize against pool wall intermittently   Pt performed Rt knee flexion with mod assist to achieve full ROM - 10 reps for strengthening  Squats x 10 reps; heel raises x 10 reps  Pt gait trained approx. 50' x 2 reps with SBA - use of noodle for UE support for balance assist Pt performed sidestepping approx. 50' x 4  reps across pool with use of noodle for assist with balance Marching approx. 50' x 2 reps across pool with use of noodle for assist with balance, providing UE support  Ai Chi postures  - 2 postures performed to facilitate improved balance with weight shifting and turning and core stabilization   Pt requires buoyancy for support with balance and viscosity for resistance for strengthening exercises                                           PT Short Term Goals - 05/28/18 1102      PT SHORT TERM GOAL #1    Title  Pt will demonstrate independence with ongoing HEP    Time  4    Period  Weeks    Status  Revised    Target Date  06/27/18      PT SHORT TERM GOAL #2   Title  Pt will improve Berg score to >/=48/56 indicating reduction in fall risk potential and improvement with balance     Baseline  45/56    Time  4    Period  Weeks    Status  Revised    Target Date  06/27/18      PT SHORT TERM GOAL #3   Title  Pt will improve gait speed to >/=2.64 ft/sec with his cane and R AFO demonstrating improvement in functional mobility    Baseline  2.5 ft/sec    Time  4    Period  Weeks    Status  Revised    Target Date  06/27/18      PT SHORT TERM GOAL #4   Title  Pt will ambulate x 250' outside over paved surfaces, up/down curb with cane/AFO and supervision    Time  4    Period  Weeks    Status  New    Target Date  06/27/18        PT Long Term Goals - 05/28/18 1113      PT LONG TERM GOAL #1   Title  Pt will be independent with final HEP (land and aquatic if pt has pool to utilize in) to improve balance and functional mobility     Time  8    Period  Weeks    Status  Revised    Target Date  07/27/18      PT LONG TERM GOAL #2   Title  Pt will ambulate 300' outside on pavement and negotiate curb with supervision with AFO and cane to improve safety with community distances    Time  8    Period  Weeks    Status  Revised    Target Date  07/27/18      PT LONG TERM GOAL #3   Title  Pt will improve Berg score to >/= 50/56 indicating reduction in fall risk potential     Time  8    Period  Weeks    Status  Revised    Target Date  07/27/18      PT LONG TERM GOAL #4   Title  Pt will improve gait speed with his cane and R AFO to >/= 2.8 ft/sec indicative of improvement in functional mobility  Time  8    Period  Weeks    Status  Revised    Target Date  07/27/18            Plan - 06/09/18 2149    Clinical Impression Statement  Pt demonstrates decreased trunk control and  decreased SLS as evidenced by LOB with increased water current resulting in perturbations with static standing and with exercises, i.e. marching, requiring SLS.  Increased PROM was noted with hip flexion with use of ankle cuff flotation weight after stretching with contract relax.  Increased range with active knee flexion noted on RLE today compared to last week's performance.      Rehab Potential  Good    Clinical Impairments Affecting Rehab Potential  Progressive nature of MS dx    PT Frequency  2x / week    PT Duration  8 weeks    PT Treatment/Interventions  ADLs/Self Care Home Management;Gait training;Stair training;Functional mobility training;Therapeutic activities;Therapeutic exercise;Balance training;Neuromuscular re-education;Passive range of motion;Manual techniques;Patient/family education;Electrical Stimulation;DME Instruction;Energy conservation    PT Next Visit Plan  aquatic therapy 1x a week; Tall and half kneeling with resisted UE strengthening into extension, scapular retraction and depression, trunk rotation.  Tall kneeling ball roll outs for closed chain hamstring training.  Treadmill with therapist facilitating at trunk - go slow!    Consulted and Agree with Plan of Care  Patient       Patient will benefit from skilled therapeutic intervention in order to improve the following deficits and impairments:  Abnormal gait, Decreased range of motion, Decreased balance, Decreased mobility, Difficulty walking, Decreased activity tolerance, Decreased strength, Decreased endurance, Impaired flexibility  Visit Diagnosis: Muscle weakness (generalized)  Unsteadiness on feet  Difficulty in walking, not elsewhere classified     Problem List Patient Active Problem List   Diagnosis Date Noted  . Vitamin D insufficiency 11/29/2016  . Hypogonadism in male 11/01/2016  . Type 1 diabetes mellitus ([place])   . Hypothyroidism   . Hypertension   . Hyperlipidemia   . Closed fracture of right  fibula and tibia 10/29/2016  . Closed tibia fracture 10/29/2016  . Closed fracture of right tibial plafond with fibula involvement 10/29/2016  . Abnormal liver function tests 03/06/2013  . RASH AND OTHER NONSPECIFIC SKIN ERUPTION 10/07/2009  . SHOULDER PAIN, LEFT 12/27/2008  . Type I (juvenile type) diabetes mellitus without mention of complication, uncontrolled 12/03/2007  . HYPOTHYROIDISM 11/12/2007  . HYPERCHOLESTEROLEMIA 11/12/2007  . MULTIPLE SCLEROSIS 08/29/2007  . PROLIFERATIVE DIABETIC RETINOPATHY 08/29/2007  . HYPERTENSION 12/25/2006    Alda Lea, PT 06/09/2018, 9:56 PM  [facility] [address] [place], [place], [address] Phone: [phone]   Fax:  [phone]  Name: Johnathan Ross MRN: [id number] Date of Birth: [birth year]

## 2018-06-10 ENCOUNTER — Encounter: Payer: Self-pay | Admitting: Endocrinology

## 2018-06-11 ENCOUNTER — Ambulatory Visit: Payer: Medicare Other | Admitting: Physical Therapy

## 2018-06-11 ENCOUNTER — Encounter: Payer: Self-pay | Admitting: Physical Therapy

## 2018-06-11 DIAGNOSIS — M21371 Foot drop, right foot: Secondary | ICD-10-CM

## 2018-06-11 DIAGNOSIS — M6281 Muscle weakness (generalized): Secondary | ICD-10-CM

## 2018-06-11 DIAGNOSIS — R2681 Unsteadiness on feet: Secondary | ICD-10-CM | POA: Diagnosis not present

## 2018-06-11 DIAGNOSIS — R262 Difficulty in walking, not elsewhere classified: Secondary | ICD-10-CM | POA: Diagnosis not present

## 2018-06-11 NOTE — Therapy (Signed)
[facility] [address] [place], [place], [address] Phone: [phone]   Fax:  [phone]  Physical Therapy Treatment  Patient Details  Name: Johnathan Ross MRN: [id number] Date of Birth: 31-Jan-1971 Referring Provider (PT): Dr. Hyman Bower   Encounter Date: 06/11/2018  PT End of Session - 06/11/18 1358    Visit Number  22    Number of Visits  33    Date for PT Re-Evaluation  07/27/18    Authorization Type  Medicare Part A and B- * Requires 10th visit PN    PT Start Time  0847    PT Stop Time  0932    PT Time Calculation (min)  45 min    Equipment Utilized During Treatment  --   water noodle used for support   Activity Tolerance  Patient tolerated treatment well    Behavior During Therapy  [facility] for tasks assessed/performed       Past Medical History:  Diagnosis Date  . Hyperlipidemia   . Hypertension   . Hypogonadism in male 11/01/2016  . Hypothyroidism   . Multiple sclerosis ([place])   . 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 Type 1 diabetes mellitus ([place]) dx'd 1994  . Vitamin D insufficiency 11/29/2016    Past Surgical History:  Procedure Laterality Date  . EYE SURGERY Bilateral    "laser for diabetic retinopathy"  . FRACTURE SURGERY    . OPEN REDUCTION INTERNAL FIXATION (ORIF) TIBIA/FIBULA FRACTURE Right 10/30/2016  . ORIF ANKLE FRACTURE Left 2015  . ORIF TIBIA FRACTURE Right 10/30/2016   Procedure: OPEN REDUCTION INTERNAL FIXATION (ORIF) TIBIA FIBULA FRACTURE;  Surgeon: Altamese Rose Hill, MD;  Location: [facility];  Service: Orthopedics;  Laterality: Right;  . RETINAL LASER PROCEDURE Bilateral    "for diabetic retinopathy"    There were no vitals filed for this visit.  Subjective Assessment - 06/11/18 0856    Subjective  Pt going to aquatic on this coming Monday instead of land therapy.  Pt asking about use of Baclofen to decrease tone.      Patient is accompained by:  --   parents   Pertinent  History  MS (dx 6 years prior), Type 1 DM, HLD, HTN, Hypogonadism in male, Hypothyroidism, Proliferative Diabetic Retinopathy, Vitamin D Insufficiency     Patient Stated Goals  Walk without an AD and improve his balance to increase safety with functional mobility     Currently in Pain?  No/denies                       [facility] Adult PT Treatment/Exercise - 06/11/18 0903      Knee/Hip Exercises: Aerobic   Tread Mill  0.5 mph x 3 minutes forwards with therapist assisting at pelvis for full weight shifting and increased stance time each LE. Performed gait backwards at 0.3 mph x 3 minutes with therapist providing facilitation at pelvis for weight shift and stance time.          Balance Exercises - 06/11/18 0926      Balance Exercises: Standing   Rockerboard  Anterior/posterior;EO;Other reps (comment);UE support   15 reps, 2 sets, feet staggered, diagonal weight shifts   Sit to Stand Time  2 sets x 5 reps sit > stand from mat with blue theraband around knees cuing pt to ABD into band with supervision > min A as he fatigued.    Other Standing Exercises  staggered feet on rockerboard first without resistance and then  against resistance of blue theraband at pelvis        PT Education - 06/11/18 1358    Education provided  Yes    Education Details  will send message to physician to discuss re-prescribing Baclofen for tone management, change to aquatic on Monday    Person(s) Educated  Patient;Parent(s)    Methods  Explanation    Comprehension  Verbalized understanding       PT Short Term Goals - 05/28/18 1102      PT SHORT TERM GOAL #1   Title  Pt will demonstrate independence with ongoing HEP    Time  4    Period  Weeks    Status  Revised    Target Date  06/27/18      PT SHORT TERM GOAL #2   Title  Pt will improve Berg score to >/=48/56 indicating reduction in fall risk potential and improvement with balance     Baseline  45/56    Time  4    Period  Weeks     Status  Revised    Target Date  06/27/18      PT SHORT TERM GOAL #3   Title  Pt will improve gait speed to >/=2.64 ft/sec with his cane and R AFO demonstrating improvement in functional mobility    Baseline  2.5 ft/sec    Time  4    Period  Weeks    Status  Revised    Target Date  06/27/18      PT SHORT TERM GOAL #4   Title  Pt will ambulate x 250' outside over paved surfaces, up/down curb with cane/AFO and supervision    Time  4    Period  Weeks    Status  New    Target Date  06/27/18        PT Long Term Goals - 05/28/18 1113      PT LONG TERM GOAL #1   Title  Pt will be independent with final HEP (land and aquatic if pt has pool to utilize in) to improve balance and functional mobility     Time  8    Period  Weeks    Status  Revised    Target Date  07/27/18      PT LONG TERM GOAL #2   Title  Pt will ambulate 300' outside on pavement and negotiate curb with supervision with AFO and cane to improve safety with community distances    Time  8    Period  Weeks    Status  Revised    Target Date  07/27/18      PT LONG TERM GOAL #3   Title  Pt will improve Berg score to >/= 50/56 indicating reduction in fall risk potential     Time  8    Period  Weeks    Status  Revised    Target Date  07/27/18      PT LONG TERM GOAL #4   Title  Pt will improve gait speed with his cane and R AFO to >/= 2.8 ft/sec indicative of improvement in functional mobility     Time  8    Period  Weeks    Status  Revised    Target Date  07/27/18            Plan - 06/11/18 1405    Clinical Impression Statement  Discussed with pt his questions about purpose and use of Baclofen; pt has prescription  at home but is hesitant to take it; pt recalls neurologist saying the Baclofen would interact with his Ampyra.  Therapist will contact neurolgist to clarify and make further recommendations about use.   Continued treadmill gait training forwards and backwards at slowed speeds with therapist providing  increased tactile cues at pelvis for full lateral weight shifting and increased stance time.  Pt demonstrated decreased reliance on UE and increased activation of hamstring on RLE.  Continued weight shift training on rockerboard and proximal hip strengthening with sit > stand.  Pt demonstrated fatigue with sit > stand exercise but overall tolerated well with no LOB on treadmill today.    Rehab Potential  Good    Clinical Impairments Affecting Rehab Potential  Progressive nature of MS dx    PT Frequency  2x / week    PT Duration  8 weeks    PT Treatment/Interventions  ADLs/Self Care Home Management;Gait training;Stair training;Functional mobility training;Therapeutic activities;Therapeutic exercise;Balance training;Neuromuscular re-education;Passive range of motion;Manual techniques;Patient/family education;Electrical Stimulation;DME Instruction;Energy conservation    PT Next Visit Plan  aquatic therapy 1x a week; Tall and half kneeling with resisted UE strengthening into extension, scapular retraction and depression, trunk rotation.  Tall kneeling ball roll outs for closed chain hamstring training.  Treadmill with therapist facilitating at trunk - go slow!    Consulted and Agree with Plan of Care  Patient       Patient will benefit from skilled therapeutic intervention in order to improve the following deficits and impairments:  Abnormal gait, Decreased range of motion, Decreased balance, Decreased mobility, Difficulty walking, Decreased activity tolerance, Decreased strength, Decreased endurance, Impaired flexibility  Visit Diagnosis: Muscle weakness (generalized)  Unsteadiness on feet  Difficulty in walking, not elsewhere classified  Foot drop, right     Problem List Patient Active Problem List   Diagnosis Date Noted  . Vitamin D insufficiency 11/29/2016  . Hypogonadism in male 11/01/2016  . Type 1 diabetes mellitus ([place])   . Hypothyroidism   . Hypertension   . Hyperlipidemia   .  Closed fracture of right fibula and tibia 10/29/2016  . Closed tibia fracture 10/29/2016  . Closed fracture of right tibial plafond with fibula involvement 10/29/2016  . Abnormal liver function tests 03/06/2013  . RASH AND OTHER NONSPECIFIC SKIN ERUPTION 10/07/2009  . SHOULDER PAIN, LEFT 12/27/2008  . Type I (juvenile type) diabetes mellitus without mention of complication, uncontrolled 12/03/2007  . HYPOTHYROIDISM 11/12/2007  . HYPERCHOLESTEROLEMIA 11/12/2007  . MULTIPLE SCLEROSIS 08/29/2007  . PROLIFERATIVE DIABETIC RETINOPATHY 08/29/2007  . HYPERTENSION 12/25/2006    [name], PT, DPT 06/11/18    2:46 PM    [facility] [address] [place], [place], [address] Phone: [phone]   Fax:  [phone]  Name: KEIONDRE COLEE MRN: [id number] Date of Birth: 09-26-1970

## 2018-06-12 ENCOUNTER — Other Ambulatory Visit: Payer: Self-pay | Admitting: Endocrinology

## 2018-06-16 ENCOUNTER — Ambulatory Visit: Payer: Medicare Other | Admitting: Physical Therapy

## 2018-06-16 DIAGNOSIS — R262 Difficulty in walking, not elsewhere classified: Secondary | ICD-10-CM

## 2018-06-16 DIAGNOSIS — R2681 Unsteadiness on feet: Secondary | ICD-10-CM | POA: Diagnosis not present

## 2018-06-16 DIAGNOSIS — M6281 Muscle weakness (generalized): Secondary | ICD-10-CM | POA: Diagnosis not present

## 2018-06-16 DIAGNOSIS — M21371 Foot drop, right foot: Secondary | ICD-10-CM | POA: Diagnosis not present

## 2018-06-16 NOTE — Therapy (Signed)
[facility] [address] [place], [place], [address] Phone: [phone]   Fax:  [phone]  Physical Therapy Treatment  Patient Details  Name: Johnathan Ross MRN: [id number] Date of Birth: Mar 21, 1971 Referring Provider (PT): Dr. Hyman Bower   Encounter Date: 06/16/2018  PT End of Session - 06/16/18 1932    Visit Number  23    Number of Visits  33    Date for PT Re-Evaluation  07/27/18    Authorization Type  Medicare Part A and B- * Requires 10th visit PN    PT Start Time  1330    PT Stop Time  1435   2:15 pt cancelled so session was extended    PT Time Calculation (min)  65 min    Equipment Utilized During Treatment  Other (comment)   aqua jogger used for flotation/support for balance   Activity Tolerance  Patient tolerated treatment well    Behavior During Therapy  [facility] for tasks assessed/performed       Past Medical History:  Diagnosis Date  . Hyperlipidemia   . Hypertension   . Hypogonadism in male 11/01/2016  . Hypothyroidism   . Multiple sclerosis ([place])   . 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 Type 1 diabetes mellitus ([place]) dx'd 1994  . Vitamin D insufficiency 11/29/2016    Past Surgical History:  Procedure Laterality Date  . EYE SURGERY Bilateral    "laser for diabetic retinopathy"  . FRACTURE SURGERY    . OPEN REDUCTION INTERNAL FIXATION (ORIF) TIBIA/FIBULA FRACTURE Right 10/30/2016  . ORIF ANKLE FRACTURE Left 2015  . ORIF TIBIA FRACTURE Right 10/30/2016   Procedure: OPEN REDUCTION INTERNAL FIXATION (ORIF) TIBIA FIBULA FRACTURE;  Surgeon: Altamese [place], MD;  Location: [facility];  Service: Orthopedics;  Laterality: Right;  . RETINAL LASER PROCEDURE Bilateral    "for diabetic retinopathy"    There were no vitals filed for this visit.  Subjective Assessment - 06/16/18 1929    Subjective  Pt presents for aquatic therapy at [facility] - states he is tired today - says some days are like this - some  days he can walk from lobby to clinic gym without rest breaks and other days (like today) he is very tired and must stop frequently to rest due to fatigue     Patient is accompained by:  Family member   parents   Pertinent History  MS (dx 6 years prior), Type 1 DM, HLD, HTN, Hypogonadism in male, Hypothyroidism, Proliferative Diabetic Retinopathy, Vitamin D Insufficiency     How long can you walk comfortably?  takes frequent standing rest breaks during gait     Patient Stated Goals  Walk without an AD and improve his balance to increase safety with functional mobility     Currently in Pain?  No/denies      Aquatic therapy - pool temp 87.2 degrees  Patient seen for aquatic therapy today.  Treatment took place in water 3.5-4 feet deep depending upon activity.  Pt entered and exited  the pool via step negotiation with use of bil. Hand rails with SBA with descension and CGA with ascension due to fatigue.    Performed runner's stretch and heel cord stretch - 30 sec hold each stretch each LE Pt performed bil. Hip flexion, extension, and abduction in standing with UE support on pool edge - 10 reps each direction each leg with ankle cuff buoyant weight for increased buoyancy/ resistance   With eccentric contraction; hip flexion/extension with  flexed knee with use of buoyant cuff weight on each leg 10 reps each Marching in place with UE support 10 reps each LE:   Pt performed Rt knee flexion with - 10 reps for strengthening - use of buoyant cuff weight used on each leg Squats x 10 reps; heel raises x 10 reps    Pt performed ambulation in pool -forwards, backwards and sideways x 2 reps each direction with SBA to CGA  - with use of noodle for UE support for balance assist Pt performed sidestepping approx. 35' x 2  reps across pool with use of noodle for assist with balance  Ai Chi postures  - 3 postures performed to facilitate trunk stabilization and improved balance with weight shifting and turning; pt  had moderate LOB with weight shifting/pivot turn posture and needed intermittent use of pool wall for stabilization  Pt performed hip strengthening exercises in supine - noodle placed under arms and aquajogger used for support for flotation - hip abdct./adduction 20 reps; knees to chest bil. LE's together - pt able to perform 3 reps prior to fatigue  Closed chain hip and knee flexion/extension - feet on pool wall - pt was assisted into flexed position by PT - pt pushed into extension by pushing off of pool wall - 10 reps, with rest period needed after rep #6 due to fatigue  Pt requires buoyancy for support with balance and viscosity for resistance for strengthening exercises; ballistic exercises performed in the pool are unable to be attempted on land due to fall risk on land as the buoyancy of the water is required for support with these activities                                                               PT Short Term Goals - 05/28/18 1102      PT SHORT TERM GOAL #1   Title  Pt will demonstrate independence with ongoing HEP    Time  4    Period  Weeks    Status  Revised    Target Date  06/27/18      PT SHORT TERM GOAL #2   Title  Pt will improve Berg score to >/=48/56 indicating reduction in fall risk potential and improvement with balance     Baseline  45/56    Time  4    Period  Weeks    Status  Revised    Target Date  06/27/18      PT SHORT TERM GOAL #3   Title  Pt will improve gait speed to >/=2.64 ft/sec with his cane and R AFO demonstrating improvement in functional mobility    Baseline  2.5 ft/sec    Time  4    Period  Weeks    Status  Revised    Target Date  06/27/18      PT SHORT TERM GOAL #4   Title  Pt will ambulate x 250' outside over paved surfaces, up/down curb with cane/AFO and supervision    Time  4    Period  Weeks    Status  New    Target Date  06/27/18        PT Long Term Goals - 05/28/18  1113      PT LONG  TERM GOAL #1   Title  Pt will be independent with final HEP (land and aquatic if pt has pool to utilize in) to improve balance and functional mobility     Time  8    Period  Weeks    Status  Revised    Target Date  07/27/18      PT LONG TERM GOAL #2   Title  Pt will ambulate 300' outside on pavement and negotiate curb with supervision with AFO and cane to improve safety with community distances    Time  8    Period  Weeks    Status  Revised    Target Date  07/27/18      PT LONG TERM GOAL #3   Title  Pt will improve Berg score to >/= 50/56 indicating reduction in fall risk potential     Time  8    Period  Weeks    Status  Revised    Target Date  07/27/18      PT LONG TERM GOAL #4   Title  Pt will improve gait speed with his cane and R AFO to >/= 2.8 ft/sec indicative of improvement in functional mobility     Time  8    Period  Weeks    Status  Revised    Target Date  07/27/18            Plan - 06/16/18 1935    Clinical Impression Statement  Aquatic therapy session focused on RLE strengthening and gait training in the water; ballistic activities (1/2 jumping jacks with UE support on bar bell with mod assist for stabilization) attempted for high level balance and quick reciprocal movements; pt had much difficulty achieving large movement with each foot clearing pool floor, but improved with clearance and size of movement with practice and repetition.  Session was extended due to cancellation following pt's treatment session - pt required frequent short rest breaks throughout entire session but tolerated well with moderate c/o fatigue after exercising for 65" minutes in water.                                                                                                                            Rehab Potential  Good    Clinical Impairments Affecting Rehab Potential  Progressive nature of MS dx    PT Frequency  2x / week    PT Duration  8 weeks    PT  Treatment/Interventions  ADLs/Self Care Home Management;Gait training;Stair training;Functional mobility training;Therapeutic activities;Therapeutic exercise;Balance training;Neuromuscular re-education;Passive range of motion;Manual techniques;Patient/family education;Electrical Stimulation;DME Instruction;Energy conservation    PT Next Visit Plan  aquatic therapy 1x a week; Tall and half kneeling with resisted UE strengthening into extension, scapular retraction and depression, trunk rotation.  Tall kneeling ball roll outs for closed chain hamstring training.  Treadmill with therapist facilitating at trunk - go slow!    PT Home Exercise Plan  Access Code: [email or web address]  Consulted and Agree with Plan of Care  Patient       Patient will benefit from skilled therapeutic intervention in order to improve the following deficits and impairments:  Abnormal gait, Decreased range of motion, Decreased balance, Decreased mobility, Difficulty walking, Decreased activity tolerance, Decreased strength, Decreased endurance, Impaired flexibility  Visit Diagnosis: Muscle weakness (generalized)  Unsteadiness on feet  Difficulty in walking, not elsewhere classified     Problem List Patient Active Problem List   Diagnosis Date Noted  . Vitamin D insufficiency 11/29/2016  . Hypogonadism in male 11/01/2016  . Type 1 diabetes mellitus ([place])   . Hypothyroidism   . Hypertension   . Hyperlipidemia   . Closed fracture of right fibula and tibia 10/29/2016  . Closed tibia fracture 10/29/2016  . Closed fracture of right tibial plafond with fibula involvement 10/29/2016  . Abnormal liver function tests 03/06/2013  . RASH AND OTHER NONSPECIFIC SKIN ERUPTION 10/07/2009  . SHOULDER PAIN, LEFT 12/27/2008  . Type I (juvenile type) diabetes mellitus without mention of complication, uncontrolled 12/03/2007  . HYPOTHYROIDISM 11/12/2007  . HYPERCHOLESTEROLEMIA 11/12/2007  . MULTIPLE SCLEROSIS 08/29/2007  . PROLIFERATIVE  DIABETIC RETINOPATHY 08/29/2007  . HYPERTENSION 12/25/2006    Alda Lea, PT 06/16/2018, 7:45 PM  [facility] [address] [place], [place], [address] Phone: [phone]   Fax:  [phone]  Name: Johnathan Ross MRN: [id number] Date of Birth: December 02, 1970

## 2018-06-18 ENCOUNTER — Encounter: Payer: Self-pay | Admitting: Physical Therapy

## 2018-06-18 ENCOUNTER — Ambulatory Visit: Payer: Medicare Other | Admitting: Physical Therapy

## 2018-06-18 DIAGNOSIS — R2681 Unsteadiness on feet: Secondary | ICD-10-CM | POA: Diagnosis not present

## 2018-06-18 DIAGNOSIS — M6281 Muscle weakness (generalized): Secondary | ICD-10-CM | POA: Diagnosis not present

## 2018-06-18 DIAGNOSIS — M21371 Foot drop, right foot: Secondary | ICD-10-CM

## 2018-06-18 DIAGNOSIS — R262 Difficulty in walking, not elsewhere classified: Secondary | ICD-10-CM | POA: Diagnosis not present

## 2018-06-18 NOTE — Therapy (Signed)
[facility] [address] [place], [place], [address] Phone: [phone]   Fax:  [phone]  Physical Therapy Treatment  Patient Details  Name: Johnathan Ross MRN: [id number] Date of Birth: May 09, 1971 Referring Provider (PT): Dr. Hyman Bower   Encounter Date: 06/18/2018  PT End of Session - 06/18/18 2152    Visit Number  24    Number of Visits  33    Date for PT Re-Evaluation  07/27/18    Authorization Type  Medicare Part A and B- * Requires 10th visit PN    PT Start Time  0848    PT Stop Time  0932    PT Time Calculation (min)  44 min    Activity Tolerance  Patient tolerated treatment well    Behavior During Therapy  [facility] for tasks assessed/performed       Past Medical History:  Diagnosis Date  . Hyperlipidemia   . Hypertension   . Hypogonadism in male 11/01/2016  . Hypothyroidism   . Multiple sclerosis ([place])   . 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 Type 1 diabetes mellitus ([place]) dx'd 1994  . Vitamin D insufficiency 11/29/2016    Past Surgical History:  Procedure Laterality Date  . EYE SURGERY Bilateral    "laser for diabetic retinopathy"  . FRACTURE SURGERY    . OPEN REDUCTION INTERNAL FIXATION (ORIF) TIBIA/FIBULA FRACTURE Right 10/30/2016  . ORIF ANKLE FRACTURE Left 2015  . ORIF TIBIA FRACTURE Right 10/30/2016   Procedure: OPEN REDUCTION INTERNAL FIXATION (ORIF) TIBIA FIBULA FRACTURE;  Surgeon: Altamese Tangent, MD;  Location: [place];  Service: Orthopedics;  Laterality: Right;  . RETINAL LASER PROCEDURE Bilateral    "for diabetic retinopathy"    There were no vitals filed for this visit.  Subjective Assessment - 06/18/18 0857    Subjective  Aquatic going well; was more fatigued and a little more fatigued today.  TSH is very high - waiting to hear from MD about how to treat.    Patient is accompained by:  Family member   parents   Pertinent History  MS (dx 6 years prior), Type 1 DM, HLD, HTN,  Hypogonadism in male, Hypothyroidism, Proliferative Diabetic Retinopathy, Vitamin D Insufficiency     How long can you walk comfortably?  takes frequent standing rest breaks during gait     Patient Stated Goals  Walk without an AD and improve his balance to increase safety with functional mobility     Currently in Pain?  No/denies                       [facility] Adult PT Treatment/Exercise - 06/18/18 0858      Therapeutic Activites    Therapeutic Activities  Other Therapeutic Activities    Other Therapeutic Activities  Discussed plan for visits in February and discussed effects of hypothyroidism on fatigue level and endurance during daily activities and therapy.      Neuro Re-ed    Neuro Re-ed Details   NMR in tall kneeling on mat performing 10-12 reps blue theraband resisted single and then bilat UE extensions, PNF D2 UE flexion<>extension, horizontal trunk rotations to L and R.  PT provided verbal cues for increased hamstring activation.  Pt performed first two exercises easily with only short rest break between exercises.  During horizontal rotations pt required increased rest breaks.      Knee/Hip Exercises: Aerobic   Tread Mill  0.5 mph x 4 minutes forwards with therapist  assisting at pelvis for full weight shifting and increased stance time each LE. Performed gait backwards at 0.3 mph x 4 minutes with therapist providing facilitation at pelvis for weight shift and stance time.  Pt demonstrated decreased knee flexion and hamstring activation today during backwards walking.             PT Education - 06/18/18 2151    Education provided  Yes    Education Details  aquatic visits for February; effects of hypothyroidism    Person(s) Educated  Patient;Parent(s)    Methods  Explanation    Comprehension  Verbalized understanding       PT Short Term Goals - 05/28/18 1102      PT SHORT TERM GOAL #1   Title  Pt will demonstrate independence with ongoing HEP    Time  4     Period  Weeks    Status  Revised    Target Date  06/27/18      PT SHORT TERM GOAL #2   Title  Pt will improve Berg score to >/=48/56 indicating reduction in fall risk potential and improvement with balance     Baseline  45/56    Time  4    Period  Weeks    Status  Revised    Target Date  06/27/18      PT SHORT TERM GOAL #3   Title  Pt will improve gait speed to >/=2.64 ft/sec with his cane and R AFO demonstrating improvement in functional mobility    Baseline  2.5 ft/sec    Time  4    Period  Weeks    Status  Revised    Target Date  06/27/18      PT SHORT TERM GOAL #4   Title  Pt will ambulate x 250' outside over paved surfaces, up/down curb with cane/AFO and supervision    Time  4    Period  Weeks    Status  New    Target Date  06/27/18        PT Long Term Goals - 05/28/18 1113      PT LONG TERM GOAL #1   Title  Pt will be independent with final HEP (land and aquatic if pt has pool to utilize in) to improve balance and functional mobility     Time  8    Period  Weeks    Status  Revised    Target Date  07/27/18      PT LONG TERM GOAL #2   Title  Pt will ambulate 300' outside on pavement and negotiate curb with supervision with AFO and cane to improve safety with community distances    Time  8    Period  Weeks    Status  Revised    Target Date  07/27/18      PT LONG TERM GOAL #3   Title  Pt will improve Berg score to >/= 50/56 indicating reduction in fall risk potential     Time  8    Period  Weeks    Status  Revised    Target Date  07/27/18      PT LONG TERM GOAL #4   Title  Pt will improve gait speed with his cane and R AFO to >/= 2.8 ft/sec indicative of improvement in functional mobility     Time  8    Period  Weeks    Status  Revised    Target Date  07/27/18  Plan - 06/18/18 2153    Clinical Impression Statement  Attempted to increase time/endurance training on treadmill at same speed with pt tolerating greater time during forward  treadmill movement but requiring increased assistance to perform backwards walking on treadmill safely due to LE fatigue and increased R foot drag and decreased extension.  Continued proximal stabililty and core training in tall kneeling with theraband resisted UE movements in various planes; pt requires decreased hands on assistance to maintain postural control and decreased rest breaks when performing.  Will continue to progress towards LTG.    Rehab Potential  Good    Clinical Impairments Affecting Rehab Potential  Progressive nature of MS dx    PT Frequency  2x / week    PT Duration  8 weeks    PT Treatment/Interventions  ADLs/Self Care Home Management;Gait training;Stair training;Functional mobility training;Therapeutic activities;Therapeutic exercise;Balance training;Neuromuscular re-education;Passive range of motion;Manual techniques;Patient/family education;Electrical Stimulation;DME Instruction;Energy conservation;Aquatic Therapy;Taping    PT Next Visit Plan  CHECK STG on WED.  aquatic therapy 1x a week; Tall and half kneeling with resisted UE strengthening into extension, scapular retraction and depression, trunk rotation.  Tall kneeling ball roll outs for closed chain hamstring training.  Treadmill with therapist facilitating at trunk - go slow!    PT Home Exercise Plan  Access Code: [email or web address]     Consulted and Agree with Plan of Care  Patient       Patient will benefit from skilled therapeutic intervention in order to improve the following deficits and impairments:  Abnormal gait, Decreased range of motion, Decreased balance, Decreased mobility, Difficulty walking, Decreased activity tolerance, Decreased strength, Decreased endurance, Impaired flexibility  Visit Diagnosis: Muscle weakness (generalized)  Unsteadiness on feet  Difficulty in walking, not elsewhere classified  Foot drop, right     Problem List Patient Active Problem List   Diagnosis Date Noted  . Vitamin D  insufficiency 11/29/2016  . Hypogonadism in male 11/01/2016  . Type 1 diabetes mellitus ([place])   . Hypothyroidism   . Hypertension   . Hyperlipidemia   . Closed fracture of right fibula and tibia 10/29/2016  . Closed tibia fracture 10/29/2016  . Closed fracture of right tibial plafond with fibula involvement 10/29/2016  . Abnormal liver function tests 03/06/2013  . RASH AND OTHER NONSPECIFIC SKIN ERUPTION 10/07/2009  . SHOULDER PAIN, LEFT 12/27/2008  . Type I (juvenile type) diabetes mellitus without mention of complication, uncontrolled 12/03/2007  . HYPOTHYROIDISM 11/12/2007  . HYPERCHOLESTEROLEMIA 11/12/2007  . MULTIPLE SCLEROSIS 08/29/2007  . PROLIFERATIVE DIABETIC RETINOPATHY 08/29/2007  . HYPERTENSION 12/25/2006    [name], PT, DPT 06/18/18    10:05 PM    [facility] [address] [place], [place], [address] Phone: [phone]   Fax:  [phone]  Name: Johnathan Ross MRN: [id number] Date of Birth: [birth year]

## 2018-06-23 ENCOUNTER — Ambulatory Visit: Payer: Medicare Other | Admitting: Physical Therapy

## 2018-06-23 DIAGNOSIS — M6281 Muscle weakness (generalized): Secondary | ICD-10-CM | POA: Diagnosis not present

## 2018-06-23 DIAGNOSIS — R262 Difficulty in walking, not elsewhere classified: Secondary | ICD-10-CM

## 2018-06-23 DIAGNOSIS — M21371 Foot drop, right foot: Secondary | ICD-10-CM | POA: Diagnosis not present

## 2018-06-23 DIAGNOSIS — R2681 Unsteadiness on feet: Secondary | ICD-10-CM | POA: Diagnosis not present

## 2018-06-24 NOTE — Therapy (Signed)
[facility] [address] [place], [place], [address] Phone: [phone]   Fax:  [phone]  Physical Therapy Treatment  Patient Details  Name: Johnathan Ross MRN: [id number] Date of Birth: 1971/04/29 Referring Provider (PT): Dr. Hyman Bower   Encounter Date: 06/23/2018  PT End of Session - 06/24/18 1939    Visit Number  25    Number of Visits  33    Date for PT Re-Evaluation  07/27/18    Authorization Type  Medicare Part A and B- * Requires 10th visit PN    PT Start Time  1500    PT Stop Time  1545    PT Time Calculation (min)  45 min    Equipment Utilized During Treatment  Other (comment)   aqua jogger used for flotation & support   Activity Tolerance  Patient tolerated treatment well    Behavior During Therapy  [facility] for tasks assessed/performed       Past Medical History:  Diagnosis Date  . Hyperlipidemia   . Hypertension   . Hypogonadism in male 11/01/2016  . Hypothyroidism   . Multiple sclerosis ([place])   . 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 Type 1 diabetes mellitus ([place]) dx'd 1994  . Vitamin D insufficiency 11/29/2016    Past Surgical History:  Procedure Laterality Date  . EYE SURGERY Bilateral    "laser for diabetic retinopathy"  . FRACTURE SURGERY    . OPEN REDUCTION INTERNAL FIXATION (ORIF) TIBIA/FIBULA FRACTURE Right 10/30/2016  . ORIF ANKLE FRACTURE Left 2015  . ORIF TIBIA FRACTURE Right 10/30/2016   Procedure: OPEN REDUCTION INTERNAL FIXATION (ORIF) TIBIA FIBULA FRACTURE;  Surgeon: Altamese La Grande, MD;  Location: [facility];  Service: Orthopedics;  Laterality: Right;  . RETINAL LASER PROCEDURE Bilateral    "for diabetic retinopathy"    There were no vitals filed for this visit.  Subjective Assessment - 06/24/18 1934    Subjective  Pt presents for aquatic therapy at [facility]; pt states he is currently taking Baclofen once a day but states he is supposed to be taking it 4x/day; states he is  considering taking it 4x/day as ordered    Patient is accompained by:  Family member    Pertinent History  MS (dx 6 years prior), Type 1 DM, HLD, HTN, Hypogonadism in male, Hypothyroidism, Proliferative Diabetic Retinopathy, Vitamin D Insufficiency     Patient Stated Goals  Walk without an AD and improve his balance to increase safety with functional mobility     Currently in Pain?  No/denies            Aquatic therapy:  Pool temp 87.4 degrees  Patient seen for aquatic therapy today.  Treatment took place in water 3.5-4 feet deep depending upon activity.  Pt entered and exited  the pool via step negotiation with use of bil. Hand rails with CGA   Performed runner's stretch and heel cord stretch - 30 sec hold each stretch each LE Pt performed bil. Hip flexion, extension, and abduction in standing with UE support on pool edge - 10 reps each direction each leg with ankle cuff buoyant weight for increased buoyancy/ resistance:  Hip flexion/extension with knee flexed at 90 degrees with ankle cuff buoyant weight Marching in place with UE support 10 reps each LE:   Pt performed Rt knee flexion  - 10 reps for strengthening  Squats x 10 reps; heel raises x 10 reps    Pt gait trained approx. 50' x 4 reps  -  use of noodle for UE support for balance assist - PT providing mod assist for stabilization of noodle Pt performed sidestepping approx. 50' x 4  reps across pool with use of noodle for assist with balance Marching approx. 50' forward and approx. 25' backward across pool with use of noodle for assist with balance, providing UE support  Pt assisted into supine position - Aquajogger used for assist with flotation and noodle placed under arms for flotation/support; pt performed hip abduction/adduction 10 reps 2 sets; bicycling legs 10 reps each with rest breaks prn  Ai Chi postures  - 2 postures performed to facilitate improved balance with weight shifting and turning and core stabilization; pt  had LOB with the posture requiring pivot turn with trunk rotation   Pt requires buoyancy for support with balance and viscosity for resistance for strengthening exercises  Rt knee flexion performed in standing - bouyancy assisted - with use of ankle cuff bouyant weight on lower leg                                         PT Short Term Goals - 05/28/18 1102      PT SHORT TERM GOAL #1   Title  Pt will demonstrate independence with ongoing HEP    Time  4    Period  Weeks    Status  Revised    Target Date  06/27/18      PT SHORT TERM GOAL #2   Title  Pt will improve Berg score to >/=48/56 indicating reduction in fall risk potential and improvement with balance     Baseline  45/56    Time  4    Period  Weeks    Status  Revised    Target Date  06/27/18      PT SHORT TERM GOAL #3   Title  Pt will improve gait speed to >/=2.64 ft/sec with his cane and R AFO demonstrating improvement in functional mobility    Baseline  2.5 ft/sec    Time  4    Period  Weeks    Status  Revised    Target Date  06/27/18      PT SHORT TERM GOAL #4   Title  Pt will ambulate x 250' outside over paved surfaces, up/down curb with cane/AFO and supervision    Time  4    Period  Weeks    Status  New    Target Date  06/27/18        PT Long Term Goals - 05/28/18 1113      PT LONG TERM GOAL #1   Title  Pt will be independent with final HEP (land and aquatic if pt has pool to utilize in) to improve balance and functional mobility     Time  8    Period  Weeks    Status  Revised    Target Date  07/27/18      PT LONG TERM GOAL #2   Title  Pt will ambulate 300' outside on pavement and negotiate curb with supervision with AFO and cane to improve safety with community distances    Time  8    Period  Weeks    Status  Revised    Target Date  07/27/18      PT LONG TERM GOAL #3   Title  Pt will improve Berg score to >/=  50/56 indicating reduction in fall risk potential      Time  8    Period  Weeks    Status  Revised    Target Date  07/27/18      PT LONG TERM GOAL #4   Title  Pt will improve gait speed with his cane and R AFO to >/= 2.8 ft/sec indicative of improvement in functional mobility     Time  8    Period  Weeks    Status  Revised    Target Date  07/27/18            Plan - 06/24/18 1940    Clinical Impression Statement  Aquatic therapy session focused on RLE strengthening, balance, core stabilization and gait training with use of buoyancy of water for support and use of viscosity of water for resistance.  Pt demonstrated improved balance and core stabilization by ability to stand unuspported in water and perform Ai Chi postures which did not incorporate trunk rotation/pivot turning.  Pt continues to exhibit decreased muscle endurance of Rt hamstrings with pt able to actively flex Rt knee to at least 90 degrees, then muscle fatigues resulting in minimal active Rt knee flexion until rest period for recovery.  Pt progressing well with aquatic exercises.                                              Rehab Potential  Good    Clinical Impairments Affecting Rehab Potential  Progressive nature of MS dx    PT Frequency  2x / week    PT Duration  8 weeks    PT Treatment/Interventions  ADLs/Self Care Home Management;Gait training;Stair training;Functional mobility training;Therapeutic activities;Therapeutic exercise;Balance training;Neuromuscular re-education;Passive range of motion;Manual techniques;Patient/family education;Electrical Stimulation;DME Instruction;Energy conservation;Aquatic Therapy;Taping    PT Next Visit Plan  CHECK STG on WED.  aquatic therapy 1x a week; Tall and half kneeling with resisted UE strengthening into extension, scapular retraction and depression, trunk rotation.  Tall kneeling ball roll outs for closed chain hamstring training.  Treadmill with therapist facilitating at trunk - go slow!    PT Home Exercise Plan  Access Code:  [email or web address]     Consulted and Agree with Plan of Care  Patient       Patient will benefit from skilled therapeutic intervention in order to improve the following deficits and impairments:  Abnormal gait, Decreased range of motion, Decreased balance, Decreased mobility, Difficulty walking, Decreased activity tolerance, Decreased strength, Decreased endurance, Impaired flexibility  Visit Diagnosis: Muscle weakness (generalized)  Unsteadiness on feet  Difficulty in walking, not elsewhere classified     Problem List Patient Active Problem List   Diagnosis Date Noted  . Vitamin D insufficiency 11/29/2016  . Hypogonadism in male 11/01/2016  . Type 1 diabetes mellitus ([place])   . Hypothyroidism   . Hypertension   . Hyperlipidemia   . Closed fracture of right fibula and tibia 10/29/2016  . Closed tibia fracture 10/29/2016  . Closed fracture of right tibial plafond with fibula involvement 10/29/2016  . Abnormal liver function tests 03/06/2013  . RASH AND OTHER NONSPECIFIC SKIN ERUPTION 10/07/2009  . SHOULDER PAIN, LEFT 12/27/2008  . Type I (juvenile type) diabetes mellitus without mention of complication, uncontrolled 12/03/2007  . HYPOTHYROIDISM 11/12/2007  . HYPERCHOLESTEROLEMIA 11/12/2007  . MULTIPLE SCLEROSIS 08/29/2007  . PROLIFERATIVE DIABETIC RETINOPATHY 08/29/2007  . HYPERTENSION  12/25/2006    Alda Lea, PT 06/24/2018, 7:53 PM  [facility] [address] [place], [place], [address] Phone: [phone]   Fax:  [phone]  Name: Johnathan Ross MRN: [id number] Date of Birth: 1970-07-24

## 2018-06-25 ENCOUNTER — Ambulatory Visit: Payer: Medicare Other | Admitting: Physical Therapy

## 2018-06-25 ENCOUNTER — Encounter: Payer: Self-pay | Admitting: Physical Therapy

## 2018-06-25 ENCOUNTER — Telehealth: Payer: Self-pay

## 2018-06-25 DIAGNOSIS — M6281 Muscle weakness (generalized): Secondary | ICD-10-CM

## 2018-06-25 DIAGNOSIS — R2681 Unsteadiness on feet: Secondary | ICD-10-CM | POA: Diagnosis not present

## 2018-06-25 DIAGNOSIS — M21371 Foot drop, right foot: Secondary | ICD-10-CM

## 2018-06-25 DIAGNOSIS — R262 Difficulty in walking, not elsewhere classified: Secondary | ICD-10-CM

## 2018-06-25 NOTE — Telephone Encounter (Signed)
Patient called back and stated that he was taking Levothyroxine 131mcg 1 tablet by mouth daily, except for Thursday.  Pt stated that during his last visit, Dr. Dwyane Dee instructed him to not take the Medication one day per week.

## 2018-06-25 NOTE — Telephone Encounter (Signed)
Would like him to come for his labs this week instead of as scheduled to reassess thyroid

## 2018-06-25 NOTE — Telephone Encounter (Signed)
Called pt to verify Levothyroxine dose per Dr. Ronnie Derby request. Pt did not answer and voicemail was left for patient to return our call as soon as possible.

## 2018-06-25 NOTE — Telephone Encounter (Signed)
lft message instructing pt to call and schedule lab appt for this week

## 2018-06-26 NOTE — Therapy (Signed)
[facility] [address] [place], [place], [address] Phone: [phone]   Fax:  [phone]  Physical Therapy Treatment  Patient Details  Name: Johnathan Ross MRN: [id number] Date of Birth: 1970/11/26 Referring Provider (PT): Dr. Hyman Bower   Encounter Date: 06/25/2018  PT End of Session - 06/25/18 0858    Visit Number  26    Number of Visits  33    Date for PT Re-Evaluation  07/27/18    Authorization Type  Medicare Part A and B- * Requires 10th visit PN    PT Start Time  0845    PT Stop Time  0929    PT Time Calculation (min)  44 min    Equipment Utilized During Treatment  --    Activity Tolerance  Patient tolerated treatment well    Behavior During Therapy  [facility] for tasks assessed/performed       Past Medical History:  Diagnosis Date  . Hyperlipidemia   . Hypertension   . Hypogonadism in male 11/01/2016  . Hypothyroidism   . Multiple sclerosis ([place])   . 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 Type 1 diabetes mellitus ([place]) dx'd 1994  . Vitamin D insufficiency 11/29/2016    Past Surgical History:  Procedure Laterality Date  . EYE SURGERY Bilateral    "laser for diabetic retinopathy"  . FRACTURE SURGERY    . OPEN REDUCTION INTERNAL FIXATION (ORIF) TIBIA/FIBULA FRACTURE Right 10/30/2016  . ORIF ANKLE FRACTURE Left 2015  . ORIF TIBIA FRACTURE Right 10/30/2016   Procedure: OPEN REDUCTION INTERNAL FIXATION (ORIF) TIBIA FIBULA FRACTURE;  Surgeon: Altamese Arroyo Seco, MD;  Location: [facility];  Service: Orthopedics;  Laterality: Right;  . RETINAL LASER PROCEDURE Bilateral    "for diabetic retinopathy"    There were no vitals filed for this visit.  Subjective Assessment - 06/25/18 0857    Subjective  Pt reports he is seeing improvements in aquatic therapy.  Frustrated with how difficult it has been to communicate with physician about thyroid.    Patient is accompained by:  Family member    Pertinent History  MS  (dx 6 years prior), Type 1 DM, HLD, HTN, Hypogonadism in male, Hypothyroidism, Proliferative Diabetic Retinopathy, Vitamin D Insufficiency     Patient Stated Goals  Walk without an AD and improve his balance to increase safety with functional mobility     Currently in Pain?  No/denies         [facility] PT Assessment - 06/25/18 0858      Standardized Balance Assessment   Standardized Balance Assessment  Berg Balance Test;10 meter walk test    10 Meter Walk  15 seconds or 2.18 ft/sec with cane and AFO, no rest breaks, no R foot drag.  Min guard      [employer]   Sit to Stand  Able to stand without using hands and stabilize independently    Standing Unsupported  Able to stand safely 2 minutes    Sitting with Back Unsupported but Feet Supported on Floor or Stool  Able to sit safely and securely 2 minutes    Stand to Sit  Sits safely with minimal use of hands    Transfers  Able to transfer safely, minor use of hands    Standing Unsupported with Eyes Closed  Able to stand 10 seconds with supervision    Standing Ubsupported with Feet Together  Able to place feet together independently and stand for 1 minute with supervision  From Standing, Reach Forward with Outstretched Arm  Can reach confidently >25 cm (10")    From Standing Position, Pick up Object from [place] to pick up shoe safely and easily    From Standing Position, Turn to Look Behind Over each Shoulder  Looks behind from both sides and weight shifts well    Turn 360 Degrees  Able to turn 360 degrees safely but slowly    Standing Unsupported, Alternately Place Feet on Step/Stool  Able to complete >2 steps/needs minimal assist    Standing Unsupported, One Foot in Front  Able to plae foot ahead of the other independently and hold 30 seconds    Standing on One Leg  Able to lift leg independently and hold equal to or more than 3 seconds    Total Score  46    Berg comment:  46/56                           PT  Education - 06/26/18 0814    Education provided  Yes    Education Details  progress towards STG, plan for next visit, how Baclofen may affect spasticity/tone and stability    Person(s) Educated  Patient;Parent(s)    Methods  Explanation    Comprehension  Verbalized understanding       PT Short Term Goals - 06/25/18 5537      PT SHORT TERM GOAL #1   Title  Pt will demonstrate independence with ongoing HEP    Time  4    Period  Weeks    Status  Achieved      PT SHORT TERM GOAL #2   Title  Pt will improve Berg score to >/=48/56 indicating reduction in fall risk potential and improvement with balance     Baseline  45/56 > 46/56    Time  4    Period  Weeks    Status  Partially Met      PT SHORT TERM GOAL #3   Title  Pt will improve gait speed to >/=2.64 ft/sec with his cane and R AFO demonstrating improvement in functional mobility    Baseline  2.5 ft/sec > 2.18 ft/sec with cane/AFO - no baclofen today    Time  4    Period  Weeks    Status  On-going      PT SHORT TERM GOAL #4   Title  Pt will ambulate x 250' outside over paved surfaces, up/down curb with cane/AFO and supervision    Time  4    Period  Weeks    Status  On-going        PT Long Term Goals - 05/28/18 1113      PT LONG TERM GOAL #1   Title  Pt will be independent with final HEP (land and aquatic if pt has pool to utilize in) to improve balance and functional mobility     Time  8    Period  Weeks    Status  Revised    Target Date  07/27/18      PT LONG TERM GOAL #2   Title  Pt will ambulate 300' outside on pavement and negotiate curb with supervision with AFO and cane to improve safety with community distances    Time  8    Period  Weeks    Status  Revised    Target Date  07/27/18      PT LONG TERM GOAL #  3   Title  Pt will improve Berg score to >/= 50/56 indicating reduction in fall risk potential     Time  8    Period  Weeks    Status  Revised    Target Date  07/27/18      PT LONG TERM GOAL #4    Title  Pt will improve gait speed with his cane and R AFO to >/= 2.8 ft/sec indicative of improvement in functional mobility     Time  8    Period  Weeks    Status  Revised    Target Date  07/27/18            Plan - 06/25/18 0930    Clinical Impression Statement  Treatment session focused on assessment of progress towards STG and education regarding how Baclofen may affect spasticity/tone and stability.  Pt is making steady progress towards goals and demonstrates slight improvement in overall standing balance but did not demonstrate improvement in gait velocity - pt was able to complete 10 meter walk test without assistance and without a rest break.  Pt continues to demonstrate impaired trunk/postural control and difficulty with pivoting and functional activities that require single limb stance.  Pt requested to retest on a day when he has taken Baclofen to assess effect on stability and performance.  Will continue to assess gait velocity and gait outside over paved surfaces next session.    Rehab Potential  Good    Clinical Impairments Affecting Rehab Potential  Progressive nature of MS dx    PT Frequency  2x / week    PT Duration  8 weeks    PT Treatment/Interventions  ADLs/Self Care Home Management;Gait training;Stair training;Functional mobility training;Therapeutic activities;Therapeutic exercise;Balance training;Neuromuscular re-education;Passive range of motion;Manual techniques;Patient/family education;Electrical Stimulation;DME Instruction;Energy conservation;Aquatic Therapy;Taping    PT Next Visit Plan  aquatic therapy 1x a week; Wed-if he took Baclofen, recheck gait velocity/gait outside x 250'/curb.   Tall and half kneeling with resisted UE strengthening into extension, scapular retraction and depression, trunk rotation.  Tall kneeling ball roll outs for closed chain hamstring training.  Treadmill with therapist facilitating at trunk - go slow!    PT Home Exercise Plan  Access Code:  [email or web address]     Consulted and Agree with Plan of Care  Patient;Family member/caregiver    Family Member Consulted  mother       Patient will benefit from skilled therapeutic intervention in order to improve the following deficits and impairments:  Abnormal gait, Decreased range of motion, Decreased balance, Decreased mobility, Difficulty walking, Decreased activity tolerance, Decreased strength, Decreased endurance, Impaired flexibility  Visit Diagnosis: Muscle weakness (generalized)  Unsteadiness on feet  Difficulty in walking, not elsewhere classified  Foot drop, right     Problem List Patient Active Problem List   Diagnosis Date Noted  . Vitamin D insufficiency 11/29/2016  . Hypogonadism in male 11/01/2016  . Type 1 diabetes mellitus ([place])   . Hypothyroidism   . Hypertension   . Hyperlipidemia   . Closed fracture of right fibula and tibia 10/29/2016  . Closed tibia fracture 10/29/2016  . Closed fracture of right tibial plafond with fibula involvement 10/29/2016  . Abnormal liver function tests 03/06/2013  . RASH AND OTHER NONSPECIFIC SKIN ERUPTION 10/07/2009  . SHOULDER PAIN, LEFT 12/27/2008  . Type I (juvenile type) diabetes mellitus without mention of complication, uncontrolled 12/03/2007  . HYPOTHYROIDISM 11/12/2007  . HYPERCHOLESTEROLEMIA 11/12/2007  . MULTIPLE SCLEROSIS 08/29/2007  . PROLIFERATIVE DIABETIC RETINOPATHY 08/29/2007  .  HYPERTENSION 12/25/2006    [name], PT, DPT 06/26/18    8:21 AM    [facility] [address] [place], [place], [address] Phone: [phone]   Fax:  [phone]  Name: Johnathan Ross MRN: [id number] Date of Birth: 08-27-1970

## 2018-06-30 ENCOUNTER — Other Ambulatory Visit (INDEPENDENT_AMBULATORY_CARE_PROVIDER_SITE_OTHER): Payer: Medicare Other

## 2018-06-30 ENCOUNTER — Ambulatory Visit: Payer: Medicare Other | Attending: Urology | Admitting: Physical Therapy

## 2018-06-30 DIAGNOSIS — R2681 Unsteadiness on feet: Secondary | ICD-10-CM | POA: Insufficient documentation

## 2018-06-30 DIAGNOSIS — E1065 Type 1 diabetes mellitus with hyperglycemia: Secondary | ICD-10-CM | POA: Diagnosis not present

## 2018-06-30 DIAGNOSIS — M21371 Foot drop, right foot: Secondary | ICD-10-CM | POA: Insufficient documentation

## 2018-06-30 DIAGNOSIS — M6281 Muscle weakness (generalized): Secondary | ICD-10-CM | POA: Diagnosis not present

## 2018-06-30 DIAGNOSIS — E063 Autoimmune thyroiditis: Secondary | ICD-10-CM | POA: Diagnosis not present

## 2018-06-30 DIAGNOSIS — R262 Difficulty in walking, not elsewhere classified: Secondary | ICD-10-CM | POA: Diagnosis not present

## 2018-06-30 LAB — T4, FREE: Free T4: 0.6 ng/dL (ref 0.60–1.60)

## 2018-06-30 LAB — HEMOGLOBIN A1C: Hgb A1c MFr Bld: 8.4 % — ABNORMAL HIGH (ref 4.6–6.5)

## 2018-06-30 LAB — BASIC METABOLIC PANEL WITH GFR
BUN: 12 mg/dL (ref 6–23)
CO2: 30 meq/L (ref 19–32)
Calcium: 9.6 mg/dL (ref 8.4–10.5)
Chloride: 101 meq/L (ref 96–112)
Creatinine, Ser: 1.15 mg/dL (ref 0.40–1.50)
GFR: 82.88 mL/min
Glucose, Bld: 174 mg/dL — ABNORMAL HIGH (ref 70–99)
Potassium: 4.3 meq/L (ref 3.5–5.1)
Sodium: 137 meq/L (ref 135–145)

## 2018-06-30 LAB — TSH: TSH: 31.67 u[IU]/mL — ABNORMAL HIGH (ref 0.35–4.50)

## 2018-07-01 NOTE — Therapy (Signed)
[facility] [address] [place], [place], [address] Phone: [phone]   Fax:  [phone]  Physical Therapy Treatment  Patient Details  Name: Johnathan Ross MRN: [id number] Date of Birth: [birth year] Referring Provider (PT): Dr. Hyman Bower   Encounter Date: 06/30/2018  PT End of Session - 07/01/18 1110    Visit Number  27    Number of Visits  33    Date for PT Re-Evaluation  07/27/18    Authorization Type  Medicare Part A and B- * Requires 10th visit PN    PT Start Time  1415    PT Stop Time  1500    PT Time Calculation (min)  45 min    Equipment Utilized During Treatment  Other (comment)   aquajogger used for flotation for part of exercises   Activity Tolerance  Patient tolerated treatment well    Behavior During Therapy  [facility] for tasks assessed/performed       Past Medical History:  Diagnosis Date  . Hyperlipidemia   . Hypertension   . Hypogonadism in male 11/01/2016  . Hypothyroidism   . Multiple sclerosis ([place])   . 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 Type 1 diabetes mellitus ([place]) dx'd 1994  . Vitamin D insufficiency 11/29/2016    Past Surgical History:  Procedure Laterality Date  . EYE SURGERY Bilateral    "laser for diabetic retinopathy"  . FRACTURE SURGERY    . OPEN REDUCTION INTERNAL FIXATION (ORIF) TIBIA/FIBULA FRACTURE Right 10/30/2016  . ORIF ANKLE FRACTURE Left 2015  . ORIF TIBIA FRACTURE Right 10/30/2016   Procedure: OPEN REDUCTION INTERNAL FIXATION (ORIF) TIBIA FIBULA FRACTURE;  Surgeon: Altamese Monowi, MD;  Location: [facility];  Service: Orthopedics;  Laterality: Right;  . RETINAL LASER PROCEDURE Bilateral    "for diabetic retinopathy"    There were no vitals filed for this visit.  Subjective Assessment - 07/01/18 1104    Subjective  Pt presents to aquatic therapy accompanied by his mother; pt states he has not started taking the Baclofen 4 times/day yet - says he is still taking it  once a day    Patient is accompained by:  Family member    Pertinent History  MS (dx 6 years prior), Type 1 DM, HLD, HTN, Hypogonadism in male, Hypothyroidism, Proliferative Diabetic Retinopathy, Vitamin D Insufficiency     Patient Stated Goals  Walk without an AD and improve his balance to increase safety with functional mobility     Currently in Pain?  No/denies        Aquatic therapy - pool temp 87.4 degrees  Patient seen for aquatic therapy today.  Treatment took place in water 3.5-4 feet deep depending upon activity.  Pt entered and exited  the pool via step negotiation with use of bil. Hand rails with SBA with descension and CGA with ascension due to fatigue.    Performed runner's stretch and heel cord stretch - 30 sec hold each stretch each LE at side of pool  Pt performed bil. Hip flexion, extension, and abduction in standing with UE support on pool edge - 10 reps each direction each leg with ankle cuff buoyant weight for increased buoyancy/ resistance   With eccentric contraction; hip flexion/extension with flexed knee with use of buoyant cuff weight on each leg 10 reps each with UE support on side of pool Marching in place with UE support 10 reps each LE:   Pt performed Rt knee flexion with - 10 reps for  strengthening (knee blocked against pool wall to prevent hip flexion substitution)  - use of buoyant cuff weight used on each leg; pt performed Rt and Lt knee extension with use of ankle cuff weight for increased resistance with knee flexion for hamstring strengthening (pt's back against pool wall) Squats x 10 reps; heel raises x 10 reps    Pt performed ambulation in pool approx. 39'  -forwards, backwards and sideways x 2 reps each direction with SBA to CGA  - with use of noodle for UE support for balance assist with PT stabilizing noodle Pt required standing rest breaks with backwards ambulation in pool - approx. 3 rest breaks needed during 72' total distance of backward  ambulation  Ai Chi postures  -1 posture performed to facilitate trunk stabilization - pt able to maintain balance while performing this movement Pt performed hip strengthening exercises in supine - noodle placed under arms  - hip abdct./adduction 20 reps; knees to chest bil. LE's together - pt able to perform approx. 5 reps prior to fatigue  Closed chain hip and knee flexion/extension - feet on pool wall - pt was assisted into flexed position by PT - pt pushed into extension by pushing off of pool wall - 10 reps  Pt requires buoyancy for support with balance and viscosity for resistance for strengthening exercises; ballistic exercises performed in the pool are unable to be attempted on land due to fall risk on land as the buoyancy of the water is required for support with these activities                                               PT Short Term Goals - 06/25/18 0922      PT SHORT TERM GOAL #1   Title  Pt will demonstrate independence with ongoing HEP    Time  4    Period  Weeks    Status  Achieved      PT SHORT TERM GOAL #2   Title  Pt will improve Berg score to >/=48/56 indicating reduction in fall risk potential and improvement with balance     Baseline  45/56 > 46/56    Time  4    Period  Weeks    Status  Partially Met      PT SHORT TERM GOAL #3   Title  Pt will improve gait speed to >/=2.64 ft/sec with his cane and R AFO demonstrating improvement in functional mobility    Baseline  2.5 ft/sec > 2.18 ft/sec with cane/AFO - no baclofen today    Time  4    Period  Weeks    Status  On-going      PT SHORT TERM GOAL #4   Title  Pt will ambulate x 250' outside over paved surfaces, up/down curb with cane/AFO and supervision    Time  4    Period  Weeks    Status  On-going        PT Long Term Goals - 05/28/18 1113      PT LONG TERM GOAL #1   Title  Pt will be independent with final HEP (land and aquatic if pt has pool to utilize in)  to improve balance and functional mobility     Time  8    Period  Weeks    Status  Revised  Target Date  07/27/18      PT LONG TERM GOAL #2   Title  Pt will ambulate 300' outside on pavement and negotiate curb with supervision with AFO and cane to improve safety with community distances    Time  8    Period  Weeks    Status  Revised    Target Date  07/27/18      PT LONG TERM GOAL #3   Title  Pt will improve Berg score to >/= 50/56 indicating reduction in fall risk potential     Time  8    Period  Weeks    Status  Revised    Target Date  07/27/18      PT LONG TERM GOAL #4   Title  Pt will improve gait speed with his cane and R AFO to >/= 2.8 ft/sec indicative of improvement in functional mobility     Time  8    Period  Weeks    Status  Revised    Target Date  07/27/18            Plan - 07/01/18 1113    Clinical Impression Statement  Aquatic therapy session focused on RLE strengthening, balance and gait training in the water with use of buoyancy for support and use of viscosity with the ankle cuffs for strengthening.  Pt appeared to have increased Rt knee flexion with strengthening exercises and also demonstrated increased muscle endurance of Rt hamstrings as pt able to perform more reps of active knee flexion than he has been able to do in previous aquatic therapy sessions.  Pt also noted to be able to maintain balance with Ai Chi postures with only mild instability noted.                                                                                                                            Rehab Potential  Good    Clinical Impairments Affecting Rehab Potential  Progressive nature of MS dx    PT Frequency  2x / week    PT Duration  8 weeks    PT Treatment/Interventions  ADLs/Self Care Home Management;Gait training;Stair training;Functional mobility training;Therapeutic activities;Therapeutic exercise;Balance training;Neuromuscular re-education;Passive range of  motion;Manual techniques;Patient/family education;Electrical Stimulation;DME Instruction;Energy conservation;Aquatic Therapy;Taping    PT Next Visit Plan  aquatic therapy 1x a week; Wed-if he took Baclofen, recheck gait velocity/gait outside x 250'/curb.   Tall and half kneeling with resisted UE strengthening into extension, scapular retraction and depression, trunk rotation.  Tall kneeling ball roll outs for closed chain hamstring training.  Treadmill with therapist facilitating at trunk - go slow!    PT Home Exercise Plan  Access Code: [email or web address]     Consulted and Agree with Plan of Care  Patient       Patient will benefit from skilled therapeutic intervention in order to improve the following deficits and impairments:  Abnormal gait, Decreased range of motion, Decreased balance, Decreased mobility,  Difficulty walking, Decreased activity tolerance, Decreased strength, Decreased endurance, Impaired flexibility  Visit Diagnosis: Muscle weakness (generalized)  Unsteadiness on feet  Difficulty in walking, not elsewhere classified     Problem List Patient Active Problem List   Diagnosis Date Noted  . Vitamin D insufficiency 11/29/2016  . Hypogonadism in male 11/01/2016  . Type 1 diabetes mellitus ([place])   . Hypothyroidism   . Hypertension   . Hyperlipidemia   . Closed fracture of right fibula and tibia 10/29/2016  . Closed tibia fracture 10/29/2016  . Closed fracture of right tibial plafond with fibula involvement 10/29/2016  . Abnormal liver function tests 03/06/2013  . RASH AND OTHER NONSPECIFIC SKIN ERUPTION 10/07/2009  . SHOULDER PAIN, LEFT 12/27/2008  . Type I (juvenile type) diabetes mellitus without mention of complication, uncontrolled 12/03/2007  . HYPOTHYROIDISM 11/12/2007  . HYPERCHOLESTEROLEMIA 11/12/2007  . MULTIPLE SCLEROSIS 08/29/2007  . PROLIFERATIVE DIABETIC RETINOPATHY 08/29/2007  . HYPERTENSION 12/25/2006    Alda Lea, PT 07/01/2018, [facility] [address] [place], [place], [address] Phone: [phone]   Fax:  [phone]  Name: CORLEY MAFFEO MRN: [id number] Date of Birth: March 23, 1971

## 2018-07-02 ENCOUNTER — Ambulatory Visit: Payer: Medicare Other | Admitting: Physical Therapy

## 2018-07-02 ENCOUNTER — Telehealth: Payer: Self-pay | Admitting: Endocrinology

## 2018-07-02 ENCOUNTER — Encounter: Payer: Self-pay | Admitting: Physical Therapy

## 2018-07-02 ENCOUNTER — Other Ambulatory Visit: Payer: Self-pay

## 2018-07-02 DIAGNOSIS — M21371 Foot drop, right foot: Secondary | ICD-10-CM

## 2018-07-02 DIAGNOSIS — R2681 Unsteadiness on feet: Secondary | ICD-10-CM

## 2018-07-02 DIAGNOSIS — M6281 Muscle weakness (generalized): Secondary | ICD-10-CM

## 2018-07-02 DIAGNOSIS — R262 Difficulty in walking, not elsewhere classified: Secondary | ICD-10-CM

## 2018-07-02 MED ORDER — LEVOTHYROXINE SODIUM 112 MCG PO TABS: 112.0000 ug | ORAL_TABLET | Freq: Every day | ORAL | 3 refills | Status: DC

## 2018-07-02 NOTE — Therapy (Signed)
[facility] [address] [place], [place], [address] Phone: [phone]   Fax:  [phone]  Physical Therapy Treatment  Patient Details  Name: Johnathan Ross MRN: [id number] Date of Birth: Mar 17, 1971 Referring Provider (PT): Dr. Hyman Bower   Encounter Date: 07/02/2018  PT End of Session - 07/02/18 1611    Visit Number  28    Number of Visits  33    Date for PT Re-Evaluation  07/27/18    Authorization Type  Medicare Part A and B- * Requires 10th visit PN    PT Start Time  0850    PT Stop Time  0934    PT Time Calculation (min)  44 min    Activity Tolerance  Patient tolerated treatment well    Behavior During Therapy  [facility] for tasks assessed/performed       Past Medical History:  Diagnosis Date  . Hyperlipidemia   . Hypertension   . Hypogonadism in male 11/01/2016  . Hypothyroidism   . Multiple sclerosis ([place])   . 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 Type 1 diabetes mellitus ([place]) dx'd 1994  . Vitamin D insufficiency 11/29/2016    Past Surgical History:  Procedure Laterality Date  . EYE SURGERY Bilateral    "laser for diabetic retinopathy"  . FRACTURE SURGERY    . OPEN REDUCTION INTERNAL FIXATION (ORIF) TIBIA/FIBULA FRACTURE Right 10/30/2016  . ORIF ANKLE FRACTURE Left 2015  . ORIF TIBIA FRACTURE Right 10/30/2016   Procedure: OPEN REDUCTION INTERNAL FIXATION (ORIF) TIBIA FIBULA FRACTURE;  Surgeon: Altamese Olanta, MD;  Location: [place];  Service: Orthopedics;  Laterality: Right;  . RETINAL LASER PROCEDURE Bilateral    "for diabetic retinopathy"    There were no vitals filed for this visit.  Subjective Assessment - 07/02/18 0856    Subjective  Took Baclofen this morning.  Feeling a little more sleepy and needing more rest breaks to walk in to treatment gym.    Patient is accompained by:  Family member    Pertinent History  MS (dx 6 years prior), Type 1 DM, HLD, HTN, Hypogonadism in male,  Hypothyroidism, Proliferative Diabetic Retinopathy, Vitamin D Insufficiency     Patient Stated Goals  Walk without an AD and improve his balance to increase safety with functional mobility     Currently in Pain?  No/denies         [facility] PT Assessment - 07/02/18 0901      Ambulation/Gait   Ambulation/Gait  Yes    Ambulation/Gait Assistance  5: Supervision    Ambulation/Gait Assistance Details  outside over pavement with cane with pt demonstrating improved knee flexion and foot clearance but requiring significantly more standing rest breaks    Ambulation Distance (Feet)  300 Feet    Assistive device  Straight cane    Ambulation Surface  Unlevel;Outdoor;Paved      Standardized Balance Assessment   Standardized Balance Assessment  10 meter walk test    10 Meter Walk  1:38 with AFO and cane; pt took Baclofen this morning                   [facility] Adult PT Treatment/Exercise - 07/02/18 0901      Knee/Hip Exercises: Aerobic   Tread Mill  0.3 mph x 5 minutes backwards with therapist providing facilitation at pelvis for lateral weight shifting and ABD of RLE; pt demonstrate improved knee flexion and hip extension when stepping backwards today  PT Education - 07/02/18 1611    Education provided  Yes    Education Details  continued education regarding MS and varying types of MS, Baclofen vs. Botox for spasticity    Person(s) Educated  Patient;Parent(s)    Methods  Explanation    Comprehension  Verbalized understanding       PT Short Term Goals - 06/25/18 5945      PT SHORT TERM GOAL #1   Title  Pt will demonstrate independence with ongoing HEP    Time  4    Period  Weeks    Status  Achieved      PT SHORT TERM GOAL #2   Title  Pt will improve Berg score to >/=48/56 indicating reduction in fall risk potential and improvement with balance     Baseline  45/56 > 46/56    Time  4    Period  Weeks    Status  Partially Met      PT SHORT TERM GOAL #3    Title  Pt will improve gait speed to >/=2.64 ft/sec with his cane and R AFO demonstrating improvement in functional mobility    Baseline  2.5 ft/sec > 2.18 ft/sec with cane/AFO - no baclofen today    Time  4    Period  Weeks    Status  On-going      PT SHORT TERM GOAL #4   Title  Pt will ambulate x 250' outside over paved surfaces, up/down curb with cane/AFO and supervision    Time  4    Period  Weeks    Status  On-going        PT Long Term Goals - 05/28/18 1113      PT LONG TERM GOAL #1   Title  Pt will be independent with final HEP (land and aquatic if pt has pool to utilize in) to improve balance and functional mobility     Time  8    Period  Weeks    Status  Revised    Target Date  07/27/18      PT LONG TERM GOAL #2   Title  Pt will ambulate 300' outside on pavement and negotiate curb with supervision with AFO and cane to improve safety with community distances    Time  8    Period  Weeks    Status  Revised    Target Date  07/27/18      PT LONG TERM GOAL #3   Title  Pt will improve Berg score to >/= 50/56 indicating reduction in fall risk potential     Time  8    Period  Weeks    Status  Revised    Target Date  07/27/18      PT LONG TERM GOAL #4   Title  Pt will improve gait speed with his cane and R AFO to >/= 2.8 ft/sec indicative of improvement in functional mobility     Time  8    Period  Weeks    Status  Revised    Target Date  07/27/18            Plan - 07/02/18 1613    Clinical Impression Statement  Performed reassessment of gait velocity and gait outside over paved surfaces with cane after taking Baclofen to see if gait/balance was affected.  Pt noted to have improved LE movement and ROM but pt required significantly more frequent and longer rest breaks.  Continued backwards gait on  treadmill to continue to focus on hamstring activation and training; pt able to tolerate 5 minutes today but continues to require assistance for active hip ABD of RLE.   Will continue to address and progress towards LTG.    Rehab Potential  Good    Clinical Impairments Affecting Rehab Potential  Progressive nature of MS dx    PT Frequency  2x / week    PT Duration  8 weeks    PT Treatment/Interventions  ADLs/Self Care Home Management;Gait training;Stair training;Functional mobility training;Therapeutic activities;Therapeutic exercise;Balance training;Neuromuscular re-education;Passive range of motion;Manual techniques;Patient/family education;Electrical Stimulation;DME Instruction;Energy conservation;Aquatic Therapy;Taping    PT Next Visit Plan  aquatic therapy 1x a week; Treadmill forwards and backwards at slow speeds, increase time to 6 minutes - focus on ABD.  Tall and half kneeling with resisted UE strengthening into extension, scapular retraction and depression, trunk rotation.  Tall kneeling ball roll outs for closed chain hamstring training.    PT Home Exercise Plan  Access Code: [email or web address]     Consulted and Agree with Plan of Care  Patient;Family member/caregiver    Family Member Consulted  mother       Patient will benefit from skilled therapeutic intervention in order to improve the following deficits and impairments:  Abnormal gait, Decreased range of motion, Decreased balance, Decreased mobility, Difficulty walking, Decreased activity tolerance, Decreased strength, Decreased endurance, Impaired flexibility  Visit Diagnosis: Muscle weakness (generalized)  Unsteadiness on feet  Difficulty in walking, not elsewhere classified  Foot drop, right     Problem List Patient Active Problem List   Diagnosis Date Noted  . Vitamin D insufficiency 11/29/2016  . Hypogonadism in male 11/01/2016  . Type 1 diabetes mellitus ([place])   . Hypothyroidism   . Hypertension   . Hyperlipidemia   . Closed fracture of right fibula and tibia 10/29/2016  . Closed tibia fracture 10/29/2016  . Closed fracture of right tibial plafond with fibula involvement 10/29/2016   . Abnormal liver function tests 03/06/2013  . RASH AND OTHER NONSPECIFIC SKIN ERUPTION 10/07/2009  . SHOULDER PAIN, LEFT 12/27/2008  . Type I (juvenile type) diabetes mellitus without mention of complication, uncontrolled 12/03/2007  . HYPOTHYROIDISM 11/12/2007  . HYPERCHOLESTEROLEMIA 11/12/2007  . MULTIPLE SCLEROSIS 08/29/2007  . PROLIFERATIVE DIABETIC RETINOPATHY 08/29/2007  . HYPERTENSION 12/25/2006    [name], PT, DPT 07/02/18    4:31 PM    [facility] [address] [place], [place], [address] Phone: [phone]   Fax:  [phone]  Name: Johnathan Ross MRN: [id number] Date of Birth: 06-03-1970

## 2018-07-02 NOTE — Telephone Encounter (Signed)
Patient returning Noah's call. Patient's ph# [phone]

## 2018-07-02 NOTE — Telephone Encounter (Signed)
See lab result note.

## 2018-07-07 ENCOUNTER — Ambulatory Visit (HOSPITAL_COMMUNITY)
Admission: EM | Admit: 2018-07-07 | Discharge: 2018-07-07 | Disposition: A | Discharge status: Home / Self Care | Payer: Medicare Other

## 2018-07-07 ENCOUNTER — Encounter: Payer: Self-pay | Admitting: Endocrinology

## 2018-07-07 ENCOUNTER — Encounter (HOSPITAL_COMMUNITY): Payer: Self-pay | Admitting: Emergency Medicine

## 2018-07-07 ENCOUNTER — Ambulatory Visit (INDEPENDENT_AMBULATORY_CARE_PROVIDER_SITE_OTHER): Payer: Medicare Other | Admitting: Endocrinology

## 2018-07-07 ENCOUNTER — Ambulatory Visit: Payer: Medicare Other | Admitting: Physical Therapy

## 2018-07-07 VITALS — BP 132/80 | HR 75 | Ht 73.0 in | Wt 210.4 lb

## 2018-07-07 DIAGNOSIS — E063 Autoimmune thyroiditis: Secondary | ICD-10-CM | POA: Diagnosis not present

## 2018-07-07 DIAGNOSIS — E1065 Type 1 diabetes mellitus with hyperglycemia: Secondary | ICD-10-CM | POA: Diagnosis not present

## 2018-07-07 DIAGNOSIS — H6123 Impacted cerumen, bilateral: Secondary | ICD-10-CM

## 2018-07-07 NOTE — ED Triage Notes (Signed)
Pt c/o L ear muffled since Wednesday, denies pain.

## 2018-07-07 NOTE — ED Provider Notes (Signed)
MRN: [id number] DOB: 1971-05-04  Subjective:   Johnathan Ross is a 48 y.o. male presenting for left-sided decreased hearing.  Patient reports a history of having trouble with earwax, tries to keep his hair trimmed in his ears.  No current facility-administered medications for this encounter.   Current Outpatient Medications:  .  dalfampridine (AMPYRA) 10 MG TB12, Take 10 mg by mouth 2 (two) times daily. Take one tablet (10 mg) by mouth every morning and mid afternoon , Disp: , Rfl:  .  diclofenac (VOLTAREN) 75 MG EC tablet, Take 1 tablet (75 mg total) by mouth 2 (two) times daily., Disp: 14 tablet, Rfl: 0 .  glucose blood (CONTOUR NEXT TEST) test strip, USE AS INSTRUCTED TO CHECK BLOOD SUGARS 8 TIMES PER DAY DX CODE E10.65, Disp: 300 each, Rfl: 3 .  insulin aspart (NOVOLOG) 100 UNIT/ML injection, USE UP TO 80 UNITS IN INSULIN PUMP DAILY-DX CODE E10.8, Disp: 60 mL, Rfl: 2 .  Insulin Human (INSULIN PUMP) SOLN, Inject into the skin continuous. Novolog, Disp: , Rfl:  .  levothyroxine (SYNTHROID, LEVOTHROID) 112 MCG tablet, Take 1 tablet (112 mcg total) by mouth daily before breakfast. TAKE 2 TABLETS BY MOUTH ONCE DAILY IN THE MORNING ON AN EMPTY STOMACH., Disp: 60 tablet, Rfl: 3 .  lisinopril (PRINIVIL,ZESTRIL) 10 MG tablet, TAKE 1 TABLET BY MOUTH EVERY DAY, Disp: 90 tablet, Rfl: 0 .  simvastatin (ZOCOR) 20 MG tablet, TAKE 1 TABLET BY MOUTH AT BEDTIME (Patient not taking: Reported on 07/07/2018), Disp: 90 tablet, Rfl: 1   No Known Allergies  Past Medical History:  Diagnosis Date  . Hyperlipidemia   . Hypertension   . Hypogonadism in male 11/01/2016  . Hypothyroidism   . Multiple sclerosis ([place])   . 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 Type 1 diabetes mellitus ([place]) dx'd 1994  . Vitamin D insufficiency 11/29/2016     Past Surgical History:  Procedure Laterality Date  . EYE SURGERY Bilateral    "laser for diabetic retinopathy"  . FRACTURE SURGERY    . OPEN REDUCTION INTERNAL  FIXATION (ORIF) TIBIA/FIBULA FRACTURE Right 10/30/2016  . ORIF ANKLE FRACTURE Left 2015  . ORIF TIBIA FRACTURE Right 10/30/2016   Procedure: OPEN REDUCTION INTERNAL FIXATION (ORIF) TIBIA FIBULA FRACTURE;  Surgeon: Altamese Vanleer, MD;  Location: [facility];  Service: Orthopedics;  Laterality: Right;  . RETINAL LASER PROCEDURE Bilateral    "for diabetic retinopathy"    ROS Fever, sinus pain, throat pain, cough, ear drainage, tinnitus, ear pain.  Objective:   Vitals: BP (!) 143/71   Pulse 79   Temp 97.8 F (36.6 C)   Resp 16   SpO2 100%   Physical Exam Constitutional:      Appearance: Normal appearance. He is well-developed and normal weight.  HENT:     Head: Normocephalic and atraumatic.     Right Ear: External ear normal. There is impacted cerumen.     Left Ear: External ear normal. There is impacted cerumen.     Nose: Nose normal.     Mouth/Throat:     Pharynx: Oropharynx is clear.  Eyes:     Extraocular Movements: Extraocular movements intact.     Pupils: Pupils are equal, round, and reactive to light.  Cardiovascular:     Rate and Rhythm: Normal rate.  Pulmonary:     Effort: Pulmonary effort is normal.  Neurological:     Mental Status: He is alert and oriented to person, place, and time.  Psychiatric:  Mood and Affect: Mood normal.        Behavior: Behavior normal.    Assessment and Plan :   Bilateral impacted cerumen  Ear lavage performed today.  Counseled on management of cerumen impaction.   Jaynee Eagles, PA-C 07/07/18 1153

## 2018-07-07 NOTE — Progress Notes (Signed)
Subjective:              Patient ID: Johnathan Ross, male   DOB: [birth year], 48 y.o.   MRN: [id number]  Diabetes    Diagnosis: Type 1 diabetes mellitus, date of diagnosis:  1995.   Insulin Pump followup:   CURRENT brand:  Medtronic  630 G   HISTORY: An insulin pump has been in use since 03/13/11.   PUMP settings are basal rate:   midnight = 1.1, 4 AM = 1.05.  4 AM = 1.15, 6 a.m.= 1.4, 7 AM = 1.65,. 9 AM = 1.7,, 10 PM = 1.7  Carbohydrate to insulin ratio: 1: 8, at 11 a.m. = 1: 7, after 6 p.m. = 1:7, hyperglycemia correction factor: 1: 30,and after 10 p.m. 1:30, target 120, active insulin time 4 hours.   Recent history:  His A1c has gone back up to 8.4  Current blood sugar patterns Evaluated from freestyle Libre and pump download, management and problems identified:  CGM use % of time  91  2-week average/SD  196+/-40  Time in range  42      %, was 55  % Time Above 180  34  % Time above 250  22  % Time Below 70 2     PRE-MEAL Fasting Lunch Dinner Bedtime Overall  Glucose range:       Averages:  167  185  208  237  196   POST-MEAL PC Breakfast PC Lunch PC Dinner  Glucose range:     Averages:  180  232  218   Current management, blood sugar patterns and problems identified  Blood sugars are overall much higher and less frequently in the target range compared to his last visit   Overnight periods: Blood sugars are mostly high although variable late at night and coming down to a variable extent through the night Overall blood sugars averaging about 170-180 during the night HYPOGLYCEMIA documented only 2 times during sleep hours after midnight  He is insisting that he is tending to get low blood sugars during the night and will try to raise his blood sugars in the evenings before bedtime to prevent this.  However his blood sugars are decreasing significantly only rarely during the night even when they are not high at bedtime  HYPOGLYCEMIA is only mild and documented  recently only after about 8-9 AM transiently  He will sometimes suspend and not use his pump on night for fear of hypoglycemia  He is not bolusing for breakfast since he thinks this is only 15 g of carbohydrate but blood sugars are usually higher midday  Also blood sugars are variably high after lunch and dinner  HIGHEST blood sugars are usually after dinner when he takes inadequate boluses or forgets to bolus  BASAL rates have been changed on his own and he is getting less basal rate during the day compared to the last time especially in the afternoon  Also he will tend to overtreat hypoglycemia and have REBOUND   Wt Readings from Last 3 Encounters:  07/07/18 210 lb 6.4 oz (95.4 kg)  04/02/18 207 lb 3.2 oz (94 kg)  12/25/17 209 lb 9.6 oz (95.1 kg)     Lab Results  Component Value Date   HGBA1C 8.4 (H) 06/30/2018   HGBA1C 8.1 (H) 03/31/2018   HGBA1C 8.4 (H) 10/23/2017   Lab Results  Component Value Date   MICROALBUR 1.1 12/23/2017   LDLCALC 74 03/31/2018   CREATININE 1.15 06/30/2018  OTHER active problems discussed today: See review of systems    Allergies as of 07/07/2018   No Known Allergies     Medication List       Accurate as of July 07, 2018  9:04 AM. Always use your most recent med list.        AMPYRA 10 MG Tb12 Generic drug:  dalfampridine Take 10 mg by mouth 2 (two) times daily. Take one tablet (10 mg) by mouth every morning and mid afternoon   diclofenac 75 MG EC tablet Commonly known as:  VOLTAREN Take 1 tablet (75 mg total) by mouth 2 (two) times daily.   glucose blood test strip Commonly known as:  CONTOUR NEXT TEST USE AS INSTRUCTED TO CHECK BLOOD SUGARS 8 TIMES PER DAY DX CODE E10.65   insulin aspart 100 UNIT/ML injection Commonly known as:  NOVOLOG USE UP TO 80 UNITS IN INSULIN PUMP DAILY-DX CODE E10.8   insulin pump Soln Inject into the skin continuous. Novolog   levothyroxine 112 MCG tablet Commonly known as:  SYNTHROID,  LEVOTHROID Take 1 tablet (112 mcg total) by mouth daily before breakfast. TAKE 2 TABLETS BY MOUTH ONCE DAILY IN THE MORNING ON AN EMPTY STOMACH.   lisinopril 10 MG tablet Commonly known as:  PRINIVIL,ZESTRIL TAKE 1 TABLET BY MOUTH EVERY DAY   simvastatin 20 MG tablet Commonly known as:  ZOCOR TAKE 1 TABLET BY MOUTH AT BEDTIME       Allergies: No Known Allergies  Past Medical History:  Diagnosis Date  . Hyperlipidemia   . Hypertension   . Hypogonadism in male 11/01/2016  . Hypothyroidism   . Multiple sclerosis ([place])   . 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 Type 1 diabetes mellitus ([place]) dx'd 1994  . Vitamin D insufficiency 11/29/2016    Past Surgical History:  Procedure Laterality Date  . EYE SURGERY Bilateral    "laser for diabetic retinopathy"  . FRACTURE SURGERY    . OPEN REDUCTION INTERNAL FIXATION (ORIF) TIBIA/FIBULA FRACTURE Right 10/30/2016  . ORIF ANKLE FRACTURE Left 2015  . ORIF TIBIA FRACTURE Right 10/30/2016   Procedure: OPEN REDUCTION INTERNAL FIXATION (ORIF) TIBIA FIBULA FRACTURE;  Surgeon: Altamese [place]uffs, MD;  Location: [facility];  Service: Orthopedics;  Laterality: Right;  . RETINAL LASER PROCEDURE Bilateral    "for diabetic retinopathy"    Family History  Problem Relation Age of Onset  . Diabetes Sister   . Colon cancer Maternal Grandmother   . Colon polyps Maternal Uncle     Social History:  reports that he has never smoked. He has never used smokeless tobacco. He reports that he does not drink alcohol or use drugs.    Review of Systems   He is asking about his left ear feeling clogged    HYPERCHOLESTEROLEMIA: He has been taking simvastatin 20 mg consistently   LDL is below 100 consistently  Has no increase in ALT  Lab Results  Component Value Date   CHOL 124 03/31/2018   HDL 40.70 03/31/2018   LDLCALC 74 03/31/2018   LDLDIRECT 139.7 04/09/2014   TRIG 44.0 03/31/2018   CHOLHDL 3 03/31/2018   Lab Results  Component Value Date    ALT 20 03/31/2018    HYPERTENSION:  has  been on lisinopril with good control  of blood pressure    BP Readings from Last 3 Encounters:  07/07/18 132/80  04/02/18 128/82  12/25/17 140/80    He has MULTIPLE sclerosis, is followed in [place] by neurologist and still not  on treatment  HYPOTHYROIDISM: This is long-standing  His thyroid requirement has changed periodically and has been taking higher doses in 2018  His TSH has been extremely variable and not clear why it fluctuates between low and very high readings, outside labs indicate his TSH was about 80 in January Also he thinks he feels more sleepy during the day recently  Weight is stable He is not aware that he has to take his levothyroxine before breakfast and only started doing this with his new prescription of 224 mcg daily that was started after his labs earlier this month, previously taking 175    Lab Results  Component Value Date   TSH 31.67 (H) 06/30/2018   TSH 0.03 (L) 03/31/2018   TSH 1.12 12/23/2017   FREET4 0.60 06/30/2018   FREET4 1.36 03/31/2018   FREET4 1.22 12/23/2017        Objective:   Physical Exam  BP 132/80 (BP Location: Left Arm, Patient Position: Sitting, Cuff Size: Normal)   Pulse 75   Ht 6\' 1"  (1.854 m)   Wt 210 lb 6.4 oz (95.4 kg)   SpO2 98%   BMI 27.76 kg/m        Wax present on the left ear   Assessment:      DIABETES type I on insulin pump:  See history of present illness for detailed discussion of current diabetes management, blood sugar patterns and problems identified  He has persistently difficult to control diabetes with A1c 8.4 However recent average blood sugar is much higher than usual  He is afraid of hypoglycemia and not covering his meals especially in the evenings are doing adequate correction doses His hypoglycemia has been inconsistent and only occasionally during the night or midmorning and rarely low normal before dinnertime  HYPOTHYROIDISM:  His  thyroid levels have been quite variable recently and may be partly related to his taking his levothyroxine with his evening meal previously He is now taking prescription before breakfast and the dose was increased with high TSH levels  Lipids: Controlled  HYPERTENSION: His blood pressure is fairly well controlled      Plan:      Explained to him that recently his insulin requirement appears to be more especially in the evenings He also needs to bolus consistently before each meal including at breakfast which he is not doing Again reminded him not to suspend his pump for prolonged periods of time  To reassure him we will reduce his basal rates at midnight and morning hours as follows:  Midnight = 1.0, 4 AM = 1.05, 6 AM = 1.3 and 7 AM = 1.5, 10 AM = 1.7 until midnight Sensitivity 1: 35 between 7 AM and 8 PM otherwise 1: 45  Will investigate whether he can be approved for the Medtronic sensor that is compatible with his pump to prevent hypoglycemia with a suspend feature  THYROID: He will have his thyroid levels checked again in 6 weeks  Hypertension: Mild and well controlled  Recommend that he establish with a PCP for general medical care He prefers to see urgent care center for his earwax  Counseling time on subjects discussed in assessment and plan sections is over 50% of today's 25 minute visit    There are no Patient Instructions on file for this visit.     Elayne Snare  Note: This office note was prepared with Estate agent. Any transcriptional errors that result from this process are unintentional.

## 2018-07-09 ENCOUNTER — Encounter: Payer: Self-pay | Admitting: Physical Therapy

## 2018-07-09 ENCOUNTER — Ambulatory Visit: Payer: Medicare Other | Admitting: Physical Therapy

## 2018-07-09 DIAGNOSIS — R2681 Unsteadiness on feet: Secondary | ICD-10-CM | POA: Diagnosis not present

## 2018-07-09 DIAGNOSIS — R262 Difficulty in walking, not elsewhere classified: Secondary | ICD-10-CM

## 2018-07-09 DIAGNOSIS — M6281 Muscle weakness (generalized): Secondary | ICD-10-CM

## 2018-07-09 DIAGNOSIS — M21371 Foot drop, right foot: Secondary | ICD-10-CM | POA: Diagnosis not present

## 2018-07-09 NOTE — Therapy (Signed)
[facility] [address] [place], [place], [address] Phone: [phone]   Fax:  [phone]  Physical Therapy Treatment  Patient Details  Name: Johnathan Ross MRN: [id number] Date of Birth: 09-21-1970 Referring Provider (PT): Dr. Hyman Bower   Encounter Date: 07/09/2018  PT End of Session - 07/09/18 1346    Visit Number  29    Number of Visits  33    Date for PT Re-Evaluation  07/27/18    Authorization Type  Medicare Part A and B- * Requires 10th visit PN    PT Start Time  0845    PT Stop Time  0934    PT Time Calculation (min)  49 min    Activity Tolerance  Patient limited by fatigue    Behavior During Therapy  [facility] for tasks assessed/performed       Past Medical History:  Diagnosis Date  . Hyperlipidemia   . Hypertension   . Hypogonadism in male 11/01/2016  . Hypothyroidism   . Multiple sclerosis ([place])   . 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 Type 1 diabetes mellitus ([place]) dx'd 1994  . Vitamin D insufficiency 11/29/2016    Past Surgical History:  Procedure Laterality Date  . EYE SURGERY Bilateral    "laser for diabetic retinopathy"  . FRACTURE SURGERY    . OPEN REDUCTION INTERNAL FIXATION (ORIF) TIBIA/FIBULA FRACTURE Right 10/30/2016  . ORIF ANKLE FRACTURE Left 2015  . ORIF TIBIA FRACTURE Right 10/30/2016   Procedure: OPEN REDUCTION INTERNAL FIXATION (ORIF) TIBIA FIBULA FRACTURE;  Surgeon: Altamese Frankfort, MD;  Location: [facility];  Service: Orthopedics;  Laterality: Right;  . RETINAL LASER PROCEDURE Bilateral    "for diabetic retinopathy"    There were no vitals filed for this visit.  Subjective Assessment - 07/09/18 0851    Subjective  Did not get to attend aquatic on Monday due to urgent care appointment.  Had appointment with endocrinologist who changed and increased thyroid medicine.    Patient is accompained by:  Family member    Pertinent History  MS (dx 6 years prior), Type 1 DM, HLD, HTN,  Hypogonadism in male, Hypothyroidism, Proliferative Diabetic Retinopathy, Vitamin D Insufficiency     Patient Stated Goals  Walk without an AD and improve his balance to increase safety with functional mobility     Currently in Pain?  No/denies                       [facility] Adult PT Treatment/Exercise - 07/09/18 0927      Transfers   Transfers  Floor to Transfer    Floor to Transfer  6: Modified independent (Device/Increase time)      Ambulation/Gait   Ambulation/Gait  Yes    Ambulation/Gait Assistance  5: Supervision    Ambulation/Gait Assistance Details  slowed gait today, increased rest breaks required and two episodes of catching foot on floor    Ambulation Distance (Feet)  115 Feet    Assistive device  Straight cane    Ambulation Surface  Level;Indoor      Knee/Hip Exercises: Standing   Knee Flexion  Strengthening;Both;2 sets;10 reps    Knee Flexion Limitations  in tall kneeling, ball roll outs with min A.  Progressed to R and L half kneeling with UE support on ball and mat with pt utilizing hamstring to shift forwards to front LE and pushing through front LE to move back to midline.  Required cues to maintain upright trunk  and to activate glutes for closed chain ABD and stability.  Required mod A from therapist in half kneeling for stability and support    Hip Abduction  Stengthening;Right;Left;3 sets;10 reps;Knee straight    Abduction Limitations  performed in tall kneeling with one LE off side of mat; performed closed chain hip drops on each side, 3 sets x 3-4 reps before fatigue and then contralateral LE open chain hip ABD keeping weight shifted to stance LE.    Hip Extension  Stengthening;Right;Left;2 sets;10 reps    Extension Limitations  in tall kneeling with one leg hanging off side of mat performing alternating between hip flexion with knee bent <> hip extension with knee straight to mimic gait sequence; min A and frequent rest breaks due to LE fatigue              PT Education - 07/09/18 1346    Education provided  Yes    Education Details  plan for final sessions    Person(s) Educated  Patient;Parent(s)    Methods  Explanation    Comprehension  Verbalized understanding       PT Short Term Goals - 07/09/18 1347      PT SHORT TERM GOAL #1   Title  Pt will demonstrate independence with ongoing HEP    Time  4    Period  Weeks    Status  Achieved      PT SHORT TERM GOAL #2   Title  Pt will improve Berg score to >/=48/56 indicating reduction in fall risk potential and improvement with balance     Baseline  45/56 > 46/56    Time  4    Period  Weeks    Status  Partially Met      PT SHORT TERM GOAL #3   Title  Pt will improve gait speed to >/=2.64 ft/sec with his cane and R AFO demonstrating improvement in functional mobility    Baseline  2.5 ft/sec > 2.18 ft/sec with cane/AFO - no baclofen today    Time  4    Period  Weeks    Status  Not Met      PT SHORT TERM GOAL #4   Title  Pt will ambulate x 250' outside over paved surfaces, up/down curb with cane/AFO and supervision    Baseline  did not require assistance but did require multiple rest breaks    Time  4    Period  Weeks    Status  Achieved        PT Long Term Goals - 05/28/18 1113      PT LONG TERM GOAL #1   Title  Pt will be independent with final HEP (land and aquatic if pt has pool to utilize in) to improve balance and functional mobility     Time  8    Period  Weeks    Status  Revised    Target Date  07/27/18      PT LONG TERM GOAL #2   Title  Pt will ambulate 300' outside on pavement and negotiate curb with supervision with AFO and cane to improve safety with community distances    Time  8    Period  Weeks    Status  Revised    Target Date  07/27/18      PT LONG TERM GOAL #3   Title  Pt will improve Berg score to >/= 50/56 indicating reduction in fall risk potential     Time  8    Period  Weeks    Status  Revised    Target Date  07/27/18       PT LONG TERM GOAL #4   Title  Pt will improve gait speed with his cane and R AFO to >/= 2.8 ft/sec indicative of improvement in functional mobility     Time  8    Period  Weeks    Status  Revised    Target Date  07/27/18            Plan - 07/09/18 1346    Clinical Impression Statement  Continued to utilize tall kneeling, half kneeling and physioball for NMR and strengthening of core and proximal hip strengthening with greatest focus on glute med and hamstring strengthening.  Pt required frequent rest breaks but did not feel increased resistance in muscles (took baclofen today).  Pt had greatest difficulty maintaining core activation and balance in half kneeling even with UE support.  Will continue to address and progress towards LTG.    Rehab Potential  Good    Clinical Impairments Affecting Rehab Potential  Progressive nature of MS dx    PT Frequency  2x / week    PT Duration  8 weeks    PT Treatment/Interventions  ADLs/Self Care Home Management;Gait training;Stair training;Functional mobility training;Therapeutic activities;Therapeutic exercise;Balance training;Neuromuscular re-education;Passive range of motion;Manual techniques;Patient/family education;Electrical Stimulation;DME Instruction;Energy conservation;Aquatic Therapy;Taping    PT Next Visit Plan  10th visit PN!!   aquatic therapy 1x a week; Treadmill forwards and backwards at slow speeds, increase time to 6 minutes - focus on ABD.  Tall and half kneeling with resisted UE strengthening into extension, scapular retraction and depression, trunk rotation.  Tall kneeling ball roll outs for closed chain hamstring training.    PT Home Exercise Plan  Access Code: [email or web address]     Recommended Other Services  Plan to take a therapeutic break after 2/26    Consulted and Agree with Plan of Care  Patient;Family member/caregiver    Family Member Consulted  mother       Patient will benefit from skilled therapeutic intervention in order to  improve the following deficits and impairments:  Abnormal gait, Decreased range of motion, Decreased balance, Decreased mobility, Difficulty walking, Decreased activity tolerance, Decreased strength, Decreased endurance, Impaired flexibility  Visit Diagnosis: Muscle weakness (generalized)  Difficulty in walking, not elsewhere classified  Unsteadiness on feet  Foot drop, right     Problem List Patient Active Problem List   Diagnosis Date Noted  . Vitamin D insufficiency 11/29/2016  . Hypogonadism in male 11/01/2016  . Type 1 diabetes mellitus ([place])   . Hypothyroidism   . Hypertension   . Hyperlipidemia   . Closed fracture of right fibula and tibia 10/29/2016  . Closed tibia fracture 10/29/2016  . Closed fracture of right tibial plafond with fibula involvement 10/29/2016  . Abnormal liver function tests 03/06/2013  . RASH AND OTHER NONSPECIFIC SKIN ERUPTION 10/07/2009  . SHOULDER PAIN, LEFT 12/27/2008  . Type I (juvenile type) diabetes mellitus without mention of complication, uncontrolled 12/03/2007  . HYPOTHYROIDISM 11/12/2007  . HYPERCHOLESTEROLEMIA 11/12/2007  . MULTIPLE SCLEROSIS 08/29/2007  . PROLIFERATIVE DIABETIC RETINOPATHY 08/29/2007  . HYPERTENSION 12/25/2006    [name], PT, DPT 07/09/18    2:10 PM    [facility] [address] [place], [place], [address] Phone: [phone]   Fax:  [phone]  Name: Johnathan Ross MRN: [id number] Date of Birth: 1970-11-27

## 2018-07-14 ENCOUNTER — Ambulatory Visit: Payer: Medicare Other | Admitting: Physical Therapy

## 2018-07-14 DIAGNOSIS — R262 Difficulty in walking, not elsewhere classified: Secondary | ICD-10-CM

## 2018-07-14 DIAGNOSIS — M21371 Foot drop, right foot: Secondary | ICD-10-CM | POA: Diagnosis not present

## 2018-07-14 DIAGNOSIS — R2681 Unsteadiness on feet: Secondary | ICD-10-CM

## 2018-07-14 DIAGNOSIS — M6281 Muscle weakness (generalized): Secondary | ICD-10-CM

## 2018-07-14 NOTE — Therapy (Signed)
[facility] [address] [place], [place], [address] Phone: [phone]   Fax:  [phone]  Physical Therapy Treatment & 10th Visit Progress Note  Patient Details  Name: Johnathan Ross MRN: [id number] Date of Birth: Aug 13, 1970 Referring Provider (PT): Dr. Hyman Bower   Encounter Date: 07/14/2018  PT End of Session - 07/14/18 1820    Visit Number  30    Number of Visits  33    Date for PT Re-Evaluation  07/27/18    Authorization Type  Medicare Part A and B- * Requires 10th visit PN    PT Start Time  1400   session started 15" early due to cancellation prior to his appt.   PT Stop Time  1500    PT Time Calculation (min)  60 min    Equipment Utilized During Treatment  Other (comment)   flotation belt used for some of the exercises   Activity Tolerance  Patient tolerated treatment well    Behavior During Therapy  WFL for tasks assessed/performed       Past Medical History:  Diagnosis Date  . Hyperlipidemia   . Hypertension   . Hypogonadism in male 11/01/2016  . Hypothyroidism   . Multiple sclerosis ([place])   . 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 Type 1 diabetes mellitus ([place]) dx'd 1994  . Vitamin D insufficiency 11/29/2016    Past Surgical History:  Procedure Laterality Date  . EYE SURGERY Bilateral    "laser for diabetic retinopathy"  . FRACTURE SURGERY    . OPEN REDUCTION INTERNAL FIXATION (ORIF) TIBIA/FIBULA FRACTURE Right 10/30/2016  . ORIF ANKLE FRACTURE Left 2015  . ORIF TIBIA FRACTURE Right 10/30/2016   Procedure: OPEN REDUCTION INTERNAL FIXATION (ORIF) TIBIA FIBULA FRACTURE;  Surgeon: Altamese Glen White, MD;  Location: [facility];  Service: Orthopedics;  Laterality: Right;  . RETINAL LASER PROCEDURE Bilateral    "for diabetic retinopathy"    There were no vitals filed for this visit.  Subjective Assessment - 07/14/18 1818    Subjective  Pt presents for aquatic therapy at [facility];  states that he can't  tell that this therapy is really making a big difference in his status - states until he can walk without the cane or walker he will not be satisfied     Patient is accompained by:  Family member    Pertinent History  MS (dx 6 years prior), Type 1 DM, HLD, HTN, Hypogonadism in male, Hypothyroidism, Proliferative Diabetic Retinopathy, Vitamin D Insufficiency     Patient Stated Goals  Walk without an AD and improve his balance to increase safety with functional mobility     Currently in Pain?  No/denies         Aquatic therapy - pool temp 87.0 degrees  Patient seen for aquatic therapy today.  Treatment took place in water 3.5-4 feet deep depending upon activity.  Pt entered and exited  the pool via step negotiation with use of bil. Hand rails with SBA with descension & ascension.    Performed runner's stretch and heel cord stretch - 30 sec hold each stretch each LE at side of pool  Pt performed bil. Hip flexion, extension, and abduction in standing with UE support on pool edge - 10 reps each direction each leg with ankle cuff buoyant weight for increased buoyancy/ resistance   With eccentric contraction; hip flexion/extension with flexed knee with use of buoyant cuff weight on each leg 10 reps each with UE support on side  of pool Marching in place with UE support 10 reps each LE:   Pt performed Rt knee flexion with - 10 reps for strengthening (knee blocked against pool wall to prevent hip flexion substitution)  - use of buoyant cuff weight used on each leg Squats x 10 reps;   Pt performed ambulation in pool approx. 81'  -forwards, backwards and sideways x 2 reps each direction with SBA to CGA  - with use of noodle for UE support for balance assist with PT stabilizing noodle   Ai Chi postures  -1 posture performed to facilitate trunk stabilization and 1 posture to facilitate turning to Rt and Lt sides with pivot turn to facilitate trunk rotation and balance with turning Pt performed hip  strengthening exercises in supine - noodle placed under arms  - hip abdct./adduction 10 reps; knees to chest bil. LE's together - pt able to perform approx. 5 reps prior to fatigue  Closed chain hip and knee flexion/extension - feet on pool wall - pt was assisted into flexed position by PT - pt pushed into extension by pushing off of pool wall - 5 reps  Pt requires buoyancy for support with balance and viscosity for resistance for strengthening exercises                                 PT Short Term Goals - 07/09/18 1347      PT SHORT TERM GOAL #1   Title  Pt will demonstrate independence with ongoing HEP    Time  4    Period  Weeks    Status  Achieved      PT SHORT TERM GOAL #2   Title  Pt will improve Berg score to >/=48/56 indicating reduction in fall risk potential and improvement with balance     Baseline  45/56 > 46/56    Time  4    Period  Weeks    Status  Partially Met      PT SHORT TERM GOAL #3   Title  Pt will improve gait speed to >/=2.64 ft/sec with his cane and R AFO demonstrating improvement in functional mobility    Baseline  2.5 ft/sec > 2.18 ft/sec with cane/AFO - no baclofen today    Time  4    Period  Weeks    Status  Not Met      PT SHORT TERM GOAL #4   Title  Pt will ambulate x 250' outside over paved surfaces, up/down curb with cane/AFO and supervision    Baseline  did not require assistance but did require multiple rest breaks    Time  4    Period  Weeks    Status  Achieved        PT Long Term Goals - 05/28/18 1113      PT LONG TERM GOAL #1   Title  Pt will be independent with final HEP (land and aquatic if pt has pool to utilize in) to improve balance and functional mobility     Time  8    Period  Weeks    Status  Revised    Target Date  07/27/18      PT LONG TERM GOAL #2   Title  Pt will ambulate 300' outside on pavement and negotiate curb with supervision with AFO and cane to improve safety with community  distances    Time  8  Period  Weeks    Status  Revised    Target Date  07/27/18      PT LONG TERM GOAL #3   Title  Pt will improve Berg score to >/= 50/56 indicating reduction in fall risk potential     Time  8    Period  Weeks    Status  Revised    Target Date  07/27/18      PT LONG TERM GOAL #4   Title  Pt will improve gait speed with his cane and R AFO to >/= 2.8 ft/sec indicative of improvement in functional mobility     Time  8    Period  Weeks    Status  Revised    Target Date  07/27/18            Plan - 07/14/18 1823    Clinical Impression Statement  This progress note covers dates 06-09-18 - 07-14-18.  Pt continues to participate in aquatic therapy with focus on RLE strengthening, tone reduction, balance and gait training.  Pt continues to need assistance with balance due to decreased core stabilization and decr. turnk control.  Pt had mild LOB with Ai Chi postures and has difficulty with active right knee flexion due to hamstring weakness and increased extensor tone.  Pt continues to amb. with RW on land and uses pool noodle for UE support in water.      Rehab Potential  Good    Clinical Impairments Affecting Rehab Potential  Progressive nature of MS dx    PT Frequency  2x / week    PT Duration  8 weeks    PT Next Visit Plan  aquatic therapy 1x a week; Treadmill forwards and backwards at slow speeds, increase time to 6 minutes - focus on ABD.  Tall and half kneeling with resisted UE strengthening into extension, scapular retraction and depression, trunk rotation.  Tall kneeling ball roll outs for closed chain hamstring training.    PT Home Exercise Plan  Access Code: [email or web address]     Consulted and Agree with Plan of Care  Patient;Family member/caregiver    Family Member Consulted  mother       Patient will benefit from skilled therapeutic intervention in order to improve the following deficits and impairments:  Abnormal gait, Decreased range of motion, Decreased balance,  Decreased mobility, Difficulty walking, Decreased activity tolerance, Decreased strength, Decreased endurance, Impaired flexibility  Visit Diagnosis: Difficulty in walking, not elsewhere classified  Muscle weakness (generalized)  Unsteadiness on feet     Problem List Patient Active Problem List   Diagnosis Date Noted  . Vitamin D insufficiency 11/29/2016  . Hypogonadism in male 11/01/2016  . Type 1 diabetes mellitus ([place])   . Hypothyroidism   . Hypertension   . Hyperlipidemia   . Closed fracture of right fibula and tibia 10/29/2016  . Closed tibia fracture 10/29/2016  . Closed fracture of right tibial plafond with fibula involvement 10/29/2016  . Abnormal liver function tests 03/06/2013  . RASH AND OTHER NONSPECIFIC SKIN ERUPTION 10/07/2009  . SHOULDER PAIN, LEFT 12/27/2008  . Type I (juvenile type) diabetes mellitus without mention of complication, uncontrolled 12/03/2007  . HYPOTHYROIDISM 11/12/2007  . HYPERCHOLESTEROLEMIA 11/12/2007  . MULTIPLE SCLEROSIS 08/29/2007  . PROLIFERATIVE DIABETIC RETINOPATHY 08/29/2007  . HYPERTENSION 12/25/2006    Alda Lea, PT 07/14/2018, 6:31 PM  [facility] [address] [place], [place], [address] Phone: [phone]   Fax:  [phone]  Name:  Johnathan Ross MRN: [id number] Date of Birth: [birth year]

## 2018-07-16 ENCOUNTER — Ambulatory Visit: Payer: Medicare Other | Admitting: Physical Therapy

## 2018-07-16 ENCOUNTER — Encounter: Payer: Self-pay | Admitting: Physical Therapy

## 2018-07-16 DIAGNOSIS — M6281 Muscle weakness (generalized): Secondary | ICD-10-CM

## 2018-07-16 DIAGNOSIS — R2681 Unsteadiness on feet: Secondary | ICD-10-CM

## 2018-07-16 DIAGNOSIS — R262 Difficulty in walking, not elsewhere classified: Secondary | ICD-10-CM | POA: Diagnosis not present

## 2018-07-16 DIAGNOSIS — M21371 Foot drop, right foot: Secondary | ICD-10-CM

## 2018-07-16 NOTE — Patient Instructions (Addendum)
5 Reps without ankle weight 5 Reps with 5lb ankle weight    Access Code: [email or web address]  URL: [email or web address]  Date: 07/16/2018  Prepared by: Misty Stanley   Exercises  Quadruped Hip Extension Kicks - 5 reps - 2 sets - 1x daily - 5x weekly  Quadruped Fire Hydrant - 5 reps - 2 sets - 1x daily - 5x weekly  Bird Dog - 5 reps - 2 sets - 1x daily - 5x weekly  Side Plank on Knees - 5 reps - 2 sets - 1x daily - 5x weekly  Tall Kneeling Chop - 10 reps - 2 sets - 1x daily - 5x weekly  Standing Alternating Knee Flexion - 10 reps - 2 sets - 1x daily - 5x weekly  Single Leg Stance with Support - 4 sets - 10 seconds hold - 1x daily - 5x weekly  Standing Quarter Turn with Counter Support - 3 sets - 1x daily - 5x weekly  Marching Bridge - 5 reps - 2 sets - 1x daily - 5x weekly

## 2018-07-16 NOTE — Therapy (Signed)
[facility] [address] [place], [place], [address] Phone: [phone]   Fax:  [phone]  Physical Therapy Treatment  Patient Details  Name: Johnathan Ross MRN: [id number] Date of Birth: [birth year] Referring Provider (PT): Dr. Hyman Bower   Encounter Date: 07/16/2018  PT End of Session - 07/16/18 1333    Visit Number  31    Number of Visits  33    Date for PT Re-Evaluation  07/27/18    Authorization Type  Medicare Part A and B- * Requires 10th visit PN    PT Start Time  0845    PT Stop Time  0932    PT Time Calculation (min)  47 min    Equipment Utilized During Treatment  Other (comment)   flotation belt used for some of the exercises   Activity Tolerance  Patient tolerated treatment well    Behavior During Therapy  [facility] for tasks assessed/performed       Past Medical History:  Diagnosis Date  . Hyperlipidemia   . Hypertension   . Hypogonadism in male 11/01/2016  . Hypothyroidism   . Multiple sclerosis ([place])   . 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 Type 1 diabetes mellitus ([place]) dx'd 1994  . Vitamin D insufficiency 11/29/2016    Past Surgical History:  Procedure Laterality Date  . EYE SURGERY Bilateral    "laser for diabetic retinopathy"  . FRACTURE SURGERY    . OPEN REDUCTION INTERNAL FIXATION (ORIF) TIBIA/FIBULA FRACTURE Right 10/30/2016  . ORIF ANKLE FRACTURE Left 2015  . ORIF TIBIA FRACTURE Right 10/30/2016   Procedure: OPEN REDUCTION INTERNAL FIXATION (ORIF) TIBIA FIBULA FRACTURE;  Surgeon: Altamese [place], MD;  Location: [facility];  Service: Orthopedics;  Laterality: Right;  . RETINAL LASER PROCEDURE Bilateral    "for diabetic retinopathy"    There were no vitals filed for this visit.  Subjective Assessment - 07/16/18 0851    Subjective  Still having a hard time with hamstring activities in aquatic.    Patient is accompained by:  Family member    Pertinent History  MS (dx 6 years prior),  Type 1 DM, HLD, HTN, Hypogonadism in male, Hypothyroidism, Proliferative Diabetic Retinopathy, Vitamin D Insufficiency     Patient Stated Goals  Walk without an AD and improve his balance to increase safety with functional mobility     Currently in Pain?  No/denies        Performed the following exercises for HEP.  Updated instructions to include: 5 Reps without ankle weight 5 Reps with 5lb ankle weight    Access Code: [email or web address]  URL: [email or web address]  Date: 07/16/2018  Prepared by: Misty Stanley   Exercises  Quadruped Hip Extension Kicks - 5 reps - 2 sets - 1x daily - 5x weekly  Quadruped Fire Hydrant - 5 reps - 2 sets - 1x daily - 5x weekly  Bird Dog - 5 reps - 2 sets - 1x daily - 5x weekly  Side Plank on Knees - 5 reps - 2 sets - 1x daily - 5x weekly  Tall Kneeling Chop - 10 reps - 2 sets - 1x daily - 5x weekly  Standing Alternating Knee Flexion - 10 reps - 2 sets - 1x daily - 5x weekly   Marching Bridge - 5 reps - 2 sets - 1x daily - 5x weekly        PT Education - 07/16/18 1329    Education provided  Yes    Education Details  finalized HEP     Person(s) Educated  Patient       PT Short Term Goals - 07/09/18 1347      PT SHORT TERM GOAL #1   Title  Pt will demonstrate independence with ongoing HEP    Time  4    Period  Weeks    Status  Achieved      PT SHORT TERM GOAL #2   Title  Pt will improve Berg score to >/=48/56 indicating reduction in fall risk potential and improvement with balance     Baseline  45/56 > 46/56    Time  4    Period  Weeks    Status  Partially Met      PT SHORT TERM GOAL #3   Title  Pt will improve gait speed to >/=2.64 ft/sec with his cane and R AFO demonstrating improvement in functional mobility    Baseline  2.5 ft/sec > 2.18 ft/sec with cane/AFO - no baclofen today    Time  4    Period  Weeks    Status  Not Met      PT SHORT TERM GOAL #4   Title  Pt will ambulate x 250' outside over paved surfaces, up/down  curb with cane/AFO and supervision    Baseline  did not require assistance but did require multiple rest breaks    Time  4    Period  Weeks    Status  Achieved        PT Long Term Goals - 05/28/18 1113      PT LONG TERM GOAL #1   Title  Pt will be independent with final HEP (land and aquatic if pt has pool to utilize in) to improve balance and functional mobility     Time  8    Period  Weeks    Status  Revised    Target Date  07/27/18      PT LONG TERM GOAL #2   Title  Pt will ambulate 300' outside on pavement and negotiate curb with supervision with AFO and cane to improve safety with community distances    Time  8    Period  Weeks    Status  Revised    Target Date  07/27/18      PT LONG TERM GOAL #3   Title  Pt will improve Berg score to >/= 50/56 indicating reduction in fall risk potential     Time  8    Period  Weeks    Status  Revised    Target Date  07/27/18      PT LONG TERM GOAL #4   Title  Pt will improve gait speed with his cane and R AFO to >/= 2.8 ft/sec indicative of improvement in functional mobility     Time  8    Period  Weeks    Status  Revised    Target Date  07/27/18            Plan - 07/16/18 1334    Clinical Impression Statement  Treatment session focused on review and progression of HEP with addition of 5lb ankle weight for quadruped, supine and standing exercises.  Pt tolerated well and demonstrated overall improved endurance.  Plan to complete aquatic therapy and final land visit next week.    Rehab Potential  Good    Clinical Impairments Affecting Rehab Potential  Progressive nature of MS dx    PT Frequency  2x / week  PT Duration  8 weeks    PT Next Visit Plan  One more aquatic visit - hamstring!  Finish HEP, Check LTG and D/C    PT Home Exercise Plan  Access Code: [email or web address]     Consulted and Agree with Plan of Care  Patient;Family member/caregiver    Family Member Consulted  mother       Patient will benefit from skilled  therapeutic intervention in order to improve the following deficits and impairments:  Abnormal gait, Decreased range of motion, Decreased balance, Decreased mobility, Difficulty walking, Decreased activity tolerance, Decreased strength, Decreased endurance, Impaired flexibility  Visit Diagnosis: Difficulty in walking, not elsewhere classified  Muscle weakness (generalized)  Unsteadiness on feet  Foot drop, right     Problem List Patient Active Problem List   Diagnosis Date Noted  . Vitamin D insufficiency 11/29/2016  . Hypogonadism in male 11/01/2016  . Type 1 diabetes mellitus ([place])   . Hypothyroidism   . Hypertension   . Hyperlipidemia   . Closed fracture of right fibula and tibia 10/29/2016  . Closed tibia fracture 10/29/2016  . Closed fracture of right tibial plafond with fibula involvement 10/29/2016  . Abnormal liver function tests 03/06/2013  . RASH AND OTHER NONSPECIFIC SKIN ERUPTION 10/07/2009  . SHOULDER PAIN, LEFT 12/27/2008  . Type I (juvenile type) diabetes mellitus without mention of complication, uncontrolled 12/03/2007  . HYPOTHYROIDISM 11/12/2007  . HYPERCHOLESTEROLEMIA 11/12/2007  . MULTIPLE SCLEROSIS 08/29/2007  . PROLIFERATIVE DIABETIC RETINOPATHY 08/29/2007  . HYPERTENSION 12/25/2006   [name], PT, DPT 07/16/18    1:37 PM    [facility] [address] [place], [place], [address] Phone: [phone]   Fax:  [phone]  Name: Johnathan Ross MRN: [id number] Date of Birth: [birth year]

## 2018-07-18 ENCOUNTER — Other Ambulatory Visit: Payer: Self-pay | Admitting: Endocrinology

## 2018-07-21 ENCOUNTER — Ambulatory Visit: Payer: Medicare Other | Admitting: Physical Therapy

## 2018-07-21 DIAGNOSIS — R262 Difficulty in walking, not elsewhere classified: Secondary | ICD-10-CM | POA: Diagnosis not present

## 2018-07-21 DIAGNOSIS — R2681 Unsteadiness on feet: Secondary | ICD-10-CM | POA: Diagnosis not present

## 2018-07-21 DIAGNOSIS — M21371 Foot drop, right foot: Secondary | ICD-10-CM | POA: Diagnosis not present

## 2018-07-21 DIAGNOSIS — M6281 Muscle weakness (generalized): Secondary | ICD-10-CM | POA: Diagnosis not present

## 2018-07-22 NOTE — Therapy (Signed)
[facility] [address] [place], [place], [address] Phone: [phone]   Fax:  [phone]  Physical Therapy Treatment  Patient Details  Name: Johnathan Ross MRN: [id number] Date of Birth: May 13, 1971 Referring Provider (PT): Dr. Hyman Bower   Encounter Date: 07/21/2018  PT End of Session - 07/22/18 1930    Visit Number  32    Number of Visits  33    Date for PT Re-Evaluation  07/27/18    Authorization Type  Medicare Part A and B- * Requires 10th visit PN    PT Start Time  8850   pt arrived early for aquatic PT session   PT Stop Time  1500    PT Time Calculation (min)  65 min    Equipment Utilized During Treatment  Gait belt;Other (comment)   flotation belt and noodle used   Activity Tolerance  Patient tolerated treatment well    Behavior During Therapy  WFL for tasks assessed/performed       Past Medical History:  Diagnosis Date  . Hyperlipidemia   . Hypertension   . Hypogonadism in male 11/01/2016  . Hypothyroidism   . Multiple sclerosis ([place])   . 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 Type 1 diabetes mellitus ([place]) dx'd 1994  . Vitamin D insufficiency 11/29/2016    Past Surgical History:  Procedure Laterality Date  . EYE SURGERY Bilateral    "laser for diabetic retinopathy"  . FRACTURE SURGERY    . OPEN REDUCTION INTERNAL FIXATION (ORIF) TIBIA/FIBULA FRACTURE Right 10/30/2016  . ORIF ANKLE FRACTURE Left 2015  . ORIF TIBIA FRACTURE Right 10/30/2016   Procedure: OPEN REDUCTION INTERNAL FIXATION (ORIF) TIBIA FIBULA FRACTURE;  Surgeon: Altamese Lostant, MD;  Location: [facility];  Service: Orthopedics;  Laterality: Right;  . RETINAL LASER PROCEDURE Bilateral    "for diabetic retinopathy"    There were no vitals filed for this visit.  Subjective Assessment - 07/22/18 1927    Subjective  Pt states he does better on some days than on others; took Baclofen this morning; last scheduled aquaticn PT session -  pt states he may [facility] or [facility] the [facility]    Patient is accompained by:  Family member    Patient Stated Goals  Walk without an AD and improve his balance to increase safety with functional mobility     Currently in Pain?  No/denies           Aquatic therapy - pool temp 87.0 degrees  Patient seen for aquatic therapy today.  Treatment took place in water 3.5-4 feet deep depending upon activity.  Pt entered and exited  the pool via step negotiation with use of bil. Hand rails with SBA.    Performed runner's stretch and heel cord stretch - 30 sec hold each stretch each LE at side of pool  Pt performed bil. Hip flexion, extension, and abduction in standing with UE support on pool edge - 10 reps each direction each leg with ankle cuff buoyant weight for increased buoyancy/ resistance   With eccentric contraction; hip flexion/extension with flexed knee with use of buoyant cuff weight on each leg 10 reps each with UE support on side of pool Marching in place with UE support 10 reps each LE:   Pt performed Rt knee flexion with - 10 reps for strengthening (knee blocked against pool wall to prevent hip flexion substitution)  - use of buoyant cuff weight used on each leg; pt performed  Rt and Lt knee extension with use of ankle cuff weight for increased resistance with knee flexion for hamstring strengthening (pt's back against pool wall) Squats x 10 reps; heel raises x 10 reps:  RLE unilateral squats x 10 reps   Pt performed ambulation in pool approx. 44'  -forwards, and sideways x 2 reps each direction with SBA to CGA  - with use of noodle for UE support for balance assist with PT stabilizing noodle  Ai Chi postures  -1 posture performed to facilitate trunk stabilization and 1 posture to facilitate turning to Rt and Lt sides with pivot turn to facilitate trunk rotation and balance with turning Pt performed hip strengthening exercises in supine - noodle placed under arms  - hip  abdct./adduction 10 reps; knees to chest bil. LE's together - pt able to perform approx. 5 reps prior to fatigue  Heel raises x 10 reps bil. LE's  Closed chain hip and knee flexion/extension - feet on pool wall - pt was assisted into flexed position by PT - pt pushed into extension by pushing off of pool wall - 10 reps  Pt requires buoyancy for support with balance and viscosity for resistance for strengthening exercises; ballistic exercises performed in the pool are unable to be attempted on land due to fall risk on land as the buoyancy of the water is required for support with these activities                        PT Short Term Goals - 07/09/18 1347      PT SHORT TERM GOAL #1   Title  Pt will demonstrate independence with ongoing HEP    Time  4    Period  Weeks    Status  Achieved      PT SHORT TERM GOAL #2   Title  Pt will improve Berg score to >/=48/56 indicating reduction in fall risk potential and improvement with balance     Baseline  45/56 > 46/56    Time  4    Period  Weeks    Status  Partially Met      PT SHORT TERM GOAL #3   Title  Pt will improve gait speed to >/=2.64 ft/sec with his cane and R AFO demonstrating improvement in functional mobility    Baseline  2.5 ft/sec > 2.18 ft/sec with cane/AFO - no baclofen today    Time  4    Period  Weeks    Status  Not Met      PT SHORT TERM GOAL #4   Title  Pt will ambulate x 250' outside over paved surfaces, up/down curb with cane/AFO and supervision    Baseline  did not require assistance but did require multiple rest breaks    Time  4    Period  Weeks    Status  Achieved        PT Long Term Goals - 05/28/18 1113      PT LONG TERM GOAL #1   Title  Pt will be independent with final HEP (land and aquatic if pt has pool to utilize in) to improve balance and functional mobility     Time  8    Period  Weeks    Status  Revised    Target Date  07/27/18      PT LONG TERM GOAL #2   Title  Pt will  ambulate 300' outside on pavement and negotiate curb with  supervision with AFO and cane to improve safety with community distances    Time  8    Period  Weeks    Status  Revised    Target Date  07/27/18      PT LONG TERM GOAL #3   Title  Pt will improve Berg score to >/= 50/56 indicating reduction in fall risk potential     Time  8    Period  Weeks    Status  Revised    Target Date  07/27/18      PT LONG TERM GOAL #4   Title  Pt will improve gait speed with his cane and R AFO to >/= 2.8 ft/sec indicative of improvement in functional mobility     Time  8    Period  Weeks    Status  Revised    Target Date  07/27/18            Plan - 07/22/18 1932    Clinical Impression Statement  Aquatic therapy session focused on LE strengthening, balance & gait training with buoyancy of water for support, and core stabilization exercises.  Pt able to perform strengthening exercises with use of ankle cuff flotation weights; pt demonstrates decreased hamstring muscle endurance as he is able to perform approx. 6-7 reps of knee flexion prior to fatigue.  Pt tolerated aquatic exercises well and demonstrated improved core stabilization with Ai Chi exercises.      Rehab Potential  Good    Clinical Impairments Affecting Rehab Potential  Progressive nature of MS dx    PT Frequency  2x / week    PT Duration  8 weeks    PT Treatment/Interventions  ADLs/Self Care Home Management;Gait training;Stair training;Functional mobility training;Therapeutic activities;Therapeutic exercise;Balance training;Neuromuscular re-education;Passive range of motion;Manual techniques;Patient/family education;Electrical Stimulation;DME Instruction;Energy conservation;Aquatic Therapy;Taping    PT Next Visit Plan  Finish HEP, Check LTG and D/C    PT Home Exercise Plan  Access Code: [email or web address]     Consulted and Agree with Plan of Care  Patient;Family member/caregiver    Family Member Consulted  mother       Patient will benefit  from skilled therapeutic intervention in order to improve the following deficits and impairments:  Abnormal gait, Decreased range of motion, Decreased balance, Decreased mobility, Difficulty walking, Decreased activity tolerance, Decreased strength, Decreased endurance, Impaired flexibility  Visit Diagnosis: Unsteadiness on feet  Muscle weakness (generalized)  Difficulty in walking, not elsewhere classified     Problem List Patient Active Problem List   Diagnosis Date Noted  . Vitamin D insufficiency 11/29/2016  . Hypogonadism in male 11/01/2016  . Type 1 diabetes mellitus ([place])   . Hypothyroidism   . Hypertension   . Hyperlipidemia   . Closed fracture of right fibula and tibia 10/29/2016  . Closed tibia fracture 10/29/2016  . Closed fracture of right tibial plafond with fibula involvement 10/29/2016  . Abnormal liver function tests 03/06/2013  . RASH AND OTHER NONSPECIFIC SKIN ERUPTION 10/07/2009  . SHOULDER PAIN, LEFT 12/27/2008  . Type I (juvenile type) diabetes mellitus without mention of complication, uncontrolled 12/03/2007  . HYPOTHYROIDISM 11/12/2007  . HYPERCHOLESTEROLEMIA 11/12/2007  . MULTIPLE SCLEROSIS 08/29/2007  . PROLIFERATIVE DIABETIC RETINOPATHY 08/29/2007  . HYPERTENSION 12/25/2006    Alda Lea, PT 07/22/2018, 7:38 PM  [facility] [address] [place], [place], [address] Phone: [phone]   Fax:  [phone]  Name: DEE MADAY MRN: [id number] Date of Birth: 11/03/1970

## 2018-07-23 ENCOUNTER — Encounter: Payer: Self-pay | Admitting: Physical Therapy

## 2018-07-23 ENCOUNTER — Ambulatory Visit: Payer: Medicare Other | Admitting: Physical Therapy

## 2018-07-23 DIAGNOSIS — M6281 Muscle weakness (generalized): Secondary | ICD-10-CM

## 2018-07-23 DIAGNOSIS — M21371 Foot drop, right foot: Secondary | ICD-10-CM | POA: Diagnosis not present

## 2018-07-23 DIAGNOSIS — R262 Difficulty in walking, not elsewhere classified: Secondary | ICD-10-CM | POA: Diagnosis not present

## 2018-07-23 DIAGNOSIS — R2681 Unsteadiness on feet: Secondary | ICD-10-CM | POA: Diagnosis not present

## 2018-07-23 NOTE — Patient Instructions (Addendum)
5 Reps without ankle weight 5 Reps with 5lb ankle weight   Access Code: [email or web address]  URL: [email or web address]  Date: 07/23/2018  Prepared by: Misty Stanley   Exercises  Quadruped Hip Extension Kicks - 5 reps - 2 sets - 1x daily - 5x weekly  Quadruped Fire Hydrant - 5 reps - 2 sets - 1x daily - 5x weekly  Bird Dog - 5 reps - 2 sets - 1x daily - 5x weekly  Side Plank on Knees - 5 reps - 2 sets - 1x daily - 5x weekly  Tall Kneeling Chop - 10 reps - 2 sets - 1x daily - 5x weekly  Standing Alternating Knee Flexion - 10 reps - 2 sets - 1x daily - 5x weekly  Marching Bridge - 5 reps - 2 sets - 1x daily - 5x weekly  Standing Quarter Turn with Counter Support - 3 sets - 1x daily - 5x weekly  Standing Single Leg Stance with Counter Support - 4 sets - 30 second hold - 2x daily - 5x weekly   Aquatic:  Access Code: [email or web address]  URL: [email or web address]  Date: 07/23/2018  Prepared by: Misty Stanley   Exercises  Forward Walking - 10 reps - 3 sets - 1x daily - 7x weekly  Backward Walking - 10 reps - 3 sets - 1x daily - 7x weekly  Side Stepping - 10 reps - 3 sets - 1x daily - 7x weekly  Forward March - 10 reps - 3 sets - 1x daily - 7x weekly  Squat - 10 reps - 3 sets - 1x daily - 7x weekly  Standing Hip Abduction - 10 reps - 3 sets - 1x daily - 7x weekly  Standing Hip Flexion Extension - 10 reps - 3 sets - 1x daily - 7x weekly  Standing Hip Flexion Extension - 10 reps - 3 sets - 1x daily - 7x weekly  Backward March - 10 reps - 3 sets - 1x daily - 7x weekly  Standing Knee Flexion - 10 reps - 3 sets - 1x daily - 7x weekly  Bilateral Shoulder Horizontal Abduction Adduction AROM - 10 reps - 3 sets - 1x daily - 7x weekly  Trunk Elongation Walking Backwards - 10 reps - 3 sets - 1x daily - 7x weekly

## 2018-07-23 NOTE — Therapy (Signed)
[facility] [address] [place], [place], [address] Phone: [phone]   Fax:  [phone]  Physical Therapy Treatment and D/C Summary  Patient Details  Name: Johnathan Ross MRN: [id number] Date of Birth: 02/27/1971 Referring Provider (PT): Dr. Hyman Bower   Encounter Date: 07/23/2018  PT End of Session - 07/23/18 1348    Visit Number  33    Number of Visits  33    Date for PT Re-Evaluation  07/27/18    Authorization Type  Medicare Part A and B- * Requires 10th visit PN    PT Start Time  0840    PT Stop Time  0930    PT Time Calculation (min)  50 min    Activity Tolerance  Patient tolerated treatment well    Behavior During Therapy  [facility] for tasks assessed/performed       Past Medical History:  Diagnosis Date  . Hyperlipidemia   . Hypertension   . Hypogonadism in male 11/01/2016  . Hypothyroidism   . Multiple sclerosis ([place])   . 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 Type 1 diabetes mellitus ([place]) dx'd 1994  . Vitamin D insufficiency 11/29/2016    Past Surgical History:  Procedure Laterality Date  . EYE SURGERY Bilateral    "laser for diabetic retinopathy"  . FRACTURE SURGERY    . OPEN REDUCTION INTERNAL FIXATION (ORIF) TIBIA/FIBULA FRACTURE Right 10/30/2016  . ORIF ANKLE FRACTURE Left 2015  . ORIF TIBIA FRACTURE Right 10/30/2016   Procedure: OPEN REDUCTION INTERNAL FIXATION (ORIF) TIBIA FIBULA FRACTURE;  Surgeon: Altamese [place], MD;  Location: [place];  Service: Orthopedics;  Laterality: Right;  . RETINAL LASER PROCEDURE Bilateral    "for diabetic retinopathy"    There were no vitals filed for this visit.  Subjective Assessment - 07/23/18 0849    Subjective  Feeling sleepy this morning, attributes it to the hypothyroidism and possibly the Baclofen.  Did very well at pool, was able to do most of the exercises without holding the wall.  Is considering going to the [facility] regularly.    Patient is  accompained by:  Family member    Pertinent History  MS (dx 6 years prior), Type 1 DM, HLD, HTN, Hypogonadism in male, Hypothyroidism, Proliferative Diabetic Retinopathy, Vitamin D Insufficiency     Limitations  Lifting;Standing;Walking    How long can you sit comfortably?  unlimited    How long can you stand comfortably?  unlimited    How long can you walk comfortably?  takes frequent standing rest breaks during gait     Patient Stated Goals  Walk without an AD and improve his balance to increase safety with functional mobility     Currently in Pain?  No/denies         [facility] PT Assessment - 07/23/18 0853      Assessment   Medical Diagnosis  MS and falls    Referring Provider (PT)  Dr. Hyman Bower      Standardized Balance Assessment   Standardized Balance Assessment  10 meter walk test;Berg Balance Test    10 Meter Walk  33 seconds with AFO, cane - took Baclofen.  0.99 ft/sec with 2 standing rest breaks      Berg Balance Test   Sit to Stand  Able to stand without using hands and stabilize independently    Standing Unsupported  Able to stand safely 2 minutes    Sitting with Back Unsupported but Feet Supported on Floor or  Stool  Able to sit safely and securely 2 minutes    Stand to Sit  Sits safely with minimal use of hands    Transfers  Able to transfer safely, minor use of hands    Standing Unsupported with Eyes Closed  Able to stand 10 seconds safely    Standing Ubsupported with Feet Together  Able to place feet together independently and stand for 1 minute with supervision    From Standing, Reach Forward with Outstretched Arm  Can reach confidently >25 cm (10")    From Standing Position, Pick up Object from [place] to pick up shoe safely and easily    From Standing Position, Turn to Look Behind Over each Shoulder  Looks behind from both sides and weight shifts well    Turn 360 Degrees  Needs close supervision or verbal cueing    Standing Unsupported, Alternately Place Feet on  Step/Stool  Able to complete >2 steps/needs minimal assist    Standing Unsupported, One Foot in Front  Able to take small step independently and hold 30 seconds    Standing on One Leg  Tries to lift leg/unable to hold 3 seconds but remains standing independently    Total Score  44    Berg comment:  44/56         Reviewed the following exercises and revised for HEP: Standing Quarter Turn with Counter Support - 3 sets - 1x daily - 5x weekly  Standing Single Leg Stance with Counter Support - 4 sets - 30 second hold - 2x daily - 5x weekly                   PT Education - 07/23/18 1348    Education provided  Yes    Education Details  final land and aquatic HEP, D/C today    Person(s) Educated  Patient;Parent(s)    Methods  Explanation;Demonstration;Handout    Comprehension  Verbalized understanding;Returned demonstration       PT Short Term Goals - 07/09/18 1347      PT SHORT TERM GOAL #1   Title  Pt will demonstrate independence with ongoing HEP    Time  4    Period  Weeks    Status  Achieved      PT SHORT TERM GOAL #2   Title  Pt will improve Berg score to >/=48/56 indicating reduction in fall risk potential and improvement with balance     Baseline  45/56 > 46/56    Time  4    Period  Weeks    Status  Partially Met      PT SHORT TERM GOAL #3   Title  Pt will improve gait speed to >/=2.64 ft/sec with his cane and R AFO demonstrating improvement in functional mobility    Baseline  2.5 ft/sec > 2.18 ft/sec with cane/AFO - no baclofen today    Time  4    Period  Weeks    Status  Not Met      PT SHORT TERM GOAL #4   Title  Pt will ambulate x 250' outside over paved surfaces, up/down curb with cane/AFO and supervision    Baseline  did not require assistance but did require multiple rest breaks    Time  4    Period  Weeks    Status  Achieved        PT Long Term Goals - 07/23/18 5465      PT LONG TERM GOAL #  1   Title  Pt will be independent with final  HEP (land and aquatic if pt has pool to utilize in) to improve balance and functional mobility     Time  8    Period  Weeks    Status  Achieved      PT LONG TERM GOAL #2   Title  Pt will ambulate 300' outside on pavement and negotiate curb with supervision with AFO and cane to improve safety with community distances    Time  8    Period  Weeks    Status  Achieved      PT LONG TERM GOAL #3   Title  Pt will improve Berg score to >/= 50/56 indicating reduction in fall risk potential     Baseline  44/56    Time  8    Period  Weeks    Status  Not Met      PT LONG TERM GOAL #4   Title  Pt will improve gait speed with his cane and R AFO to >/= 2.8 ft/sec indicative of improvement in functional mobility     Time  8    Period  Weeks    Status  Not Met            Plan - 07/23/18 1349    Clinical Impression Statement  Treatment session focused on assessment of LTG and finalizing HEP to prepare for D/C.  Pt has made steady progress and has met 2/4 LTG.  Pt demonstrates improved balance and gait sequence with cane, improved core activation and strength when performing land and aquatic exercises and improved muscular endurance.  Pt's outcome measures continued to fluctuate based on patient's energy level each day which was also affected by pt starting Baclofen for hypertonicity.  Pt also dealing with significant hypothyroidism which may have been contributing to fatigue and varying performance levels.  Pt to D/C at this time with updated land and aquatic HEP.  Pt agreeable to D/C.    Rehab Potential  Good    Clinical Impairments Affecting Rehab Potential  Progressive nature of MS dx    PT Frequency  2x / week    PT Duration  8 weeks    PT Treatment/Interventions  ADLs/Self Care Home Management;Gait training;Stair training;Functional mobility training;Therapeutic activities;Therapeutic exercise;Balance training;Neuromuscular re-education;Passive range of motion;Manual techniques;Patient/family  education;Electrical Stimulation;DME Instruction;Energy conservation;Aquatic Therapy;Taping    PT Home Exercise Plan  Access Code: [email or web address]; Aquatic code: Luke and Agree with Plan of Care  Patient;Family member/caregiver    Family Member Consulted  mother       Patient will benefit from skilled therapeutic intervention in order to improve the following deficits and impairments:  Abnormal gait, Decreased range of motion, Decreased balance, Decreased mobility, Difficulty walking, Decreased activity tolerance, Decreased strength, Decreased endurance, Impaired flexibility  Visit Diagnosis: Unsteadiness on feet  Muscle weakness (generalized)  Difficulty in walking, not elsewhere classified  Foot drop, right     Problem List Patient Active Problem List   Diagnosis Date Noted  . Vitamin D insufficiency 11/29/2016  . Hypogonadism in male 11/01/2016  . Type 1 diabetes mellitus ([place])   . Hypothyroidism   . Hypertension   . Hyperlipidemia   . Closed fracture of right fibula and tibia 10/29/2016  . Closed tibia fracture 10/29/2016  . Closed fracture of right tibial plafond with fibula involvement 10/29/2016  . Abnormal liver function tests 03/06/2013  . RASH AND OTHER NONSPECIFIC SKIN ERUPTION 10/07/2009  .  SHOULDER PAIN, LEFT 12/27/2008  . Type I (juvenile type) diabetes mellitus without mention of complication, uncontrolled 12/03/2007  . HYPOTHYROIDISM 11/12/2007  . HYPERCHOLESTEROLEMIA 11/12/2007  . MULTIPLE SCLEROSIS 08/29/2007  . PROLIFERATIVE DIABETIC RETINOPATHY 08/29/2007  . HYPERTENSION 12/25/2006    PHYSICAL THERAPY DISCHARGE SUMMARY  Visits from Start of Care: 33  Current functional level related to goals / functional outcomes: See impression statement and LTG achievement above   Remaining deficits: Impaired strength, impaired endurance, impaired balance and gait   Education / Equipment: HEP for land and aquatic  Plan: Patient agrees to  discharge.  Patient goals were partially met. Patient is being discharged due to being pleased with the current functional level.  ?????     [name], PT, DPT 07/23/18    1:56 PM    [facility] [address] [place], [place], [address] Phone: [phone]   Fax:  [phone]  Name: Johnathan Ross MRN: [id number] Date of Birth: 1971/02/03

## 2018-08-12 ENCOUNTER — Telehealth: Payer: Self-pay | Admitting: Endocrinology

## 2018-08-12 NOTE — Telephone Encounter (Signed)
Called and left voicemail for pt requesting a callback. 

## 2018-08-12 NOTE — Telephone Encounter (Signed)
Thyroid level came back high from neurologist.  He can continue the 112 mcg prescription but on any 2 days of the week take only 1 pill instead of 2 together

## 2018-08-12 NOTE — Telephone Encounter (Signed)
Pt called back and was given MD message. Pt verbalized understanding. 

## 2018-08-20 ENCOUNTER — Other Ambulatory Visit: Payer: Medicare Other

## 2018-08-26 ENCOUNTER — Other Ambulatory Visit: Payer: Self-pay

## 2018-08-27 ENCOUNTER — Encounter: Payer: Self-pay | Admitting: Endocrinology

## 2018-08-27 ENCOUNTER — Ambulatory Visit (INDEPENDENT_AMBULATORY_CARE_PROVIDER_SITE_OTHER): Payer: Medicare Other | Admitting: Endocrinology

## 2018-08-27 VITALS — BP 128/70 | HR 74 | Ht 73.0 in | Wt 205.4 lb

## 2018-08-27 DIAGNOSIS — E1065 Type 1 diabetes mellitus with hyperglycemia: Secondary | ICD-10-CM | POA: Diagnosis not present

## 2018-08-27 DIAGNOSIS — E063 Autoimmune thyroiditis: Secondary | ICD-10-CM

## 2018-08-27 DIAGNOSIS — I10 Essential (primary) hypertension: Secondary | ICD-10-CM | POA: Diagnosis not present

## 2018-08-27 LAB — GLUCOSE, RANDOM: Glucose, Bld: 213 mg/dL — ABNORMAL HIGH (ref 70–99)

## 2018-08-27 LAB — TSH: TSH: 0.04 u[IU]/mL — ABNORMAL LOW (ref 0.35–4.50)

## 2018-08-27 LAB — T4, FREE: Free T4: 0.95 ng/dL (ref 0.60–1.60)

## 2018-08-27 NOTE — Progress Notes (Addendum)
Subjective:              Patient ID: Johnathan Ross, male   DOB: 1970/06/26, 48 y.o.   MRN: [id number]  Diabetes    Diagnosis: Type 1 diabetes mellitus, date of diagnosis:  1995.   Insulin Pump followup:   CURRENT brand:  Medtronic  630 G   HISTORY: An insulin pump has been in use since 03/13/11.   PUMP settings are basal rate:   midnight = 1.1, 4 AM = 1.05.  4 AM = 1.15, 6 a.m.= 1.4, 7 AM = 1.65,. 9 AM = 1.7,, 10 PM = 1.7  Carbohydrate to insulin ratio: 1: 8, at 11 a.m. = 1: 7, after 6 p.m. = 1:7, hyperglycemia correction factor: 1: 30,and after 10 p.m. 1:30, target 120, active insulin time 4 hours.   Recent history:  His A1c has gone back up to 8.4 in February  Current blood sugar patterns Evaluated from freestyle [place] and pump download, management and problems identified:  Current management, blood sugar patterns and problems identified      He says he keeps knocking off his freestyle libre sensor and has only minimal amount of information available for review  Current impression of his blood sugar patterns his diet he has variable readings at night although generally not high along with some postprandial hyperglycemia at various times which is difficult to know whether it is consistent or not  Overnight periods: He says he may sometimes feel the blood sugar to be relatively low and last night it was somewhat low around 3:30 AM given without the late night bolus  HYPERGLYCEMIA appears to be mostly documented in the early afternoons and evenings, probably more so after evening meal  He says he has not been bolusing in the morning unless blood sugar is high since he has only 6 g of carbohydrate in his breakfast  Again he is afraid of bolusing when his blood sugars are below 100 although this is not happening very much  May not always enter his blood sugar at the time of his mealtime bolus  Currently known very active and weight is down slightly  He has been  recommended not to suspend his pump frequently and he is trying to do better with this  FASTING blood sugars are generally fairly good with recent range of 83-184 and average 139  Average blood sugar at dinnertime is 172   Wt Readings from Last 3 Encounters:  08/27/18 205 lb 6.4 oz (93.2 kg)  07/07/18 210 lb 6.4 oz (95.4 kg)  04/02/18 207 lb 3.2 oz (94 kg)     Lab Results  Component Value Date   HGBA1C 8.4 (H) 06/30/2018   HGBA1C 8.1 (H) 03/31/2018   HGBA1C 8.4 (H) 10/23/2017   Lab Results  Component Value Date   MICROALBUR 1.1 12/23/2017   [place] 74 03/31/2018   CREATININE 1.15 06/30/2018     OTHER active problems discussed today: See review of systems    Allergies as of 08/27/2018   No Known Allergies     Medication List       Accurate as of August 27, 2018 11:46 AM. Always use your most recent med list.        Ampyra 10 MG Tb12 Generic drug:  dalfampridine Take 10 mg by mouth 2 (two) times daily. Take one tablet (10 mg) by mouth every morning and mid afternoon   diclofenac 75 MG EC tablet Commonly known as:  VOLTAREN Take 1 tablet (  75 mg total) by mouth 2 (two) times daily.   glucose blood test strip Commonly known as:  Contour Next Test USE AS INSTRUCTED TO CHECK BLOOD SUGARS 8 TIMES PER DAY DX CODE E10.65   insulin aspart 100 UNIT/ML injection Commonly known as:  NovoLOG USE UP TO 80 UNITS IN INSULIN PUMP DAILY-DX CODE E10.8   insulin pump Soln Inject into the skin continuous. Novolog   levothyroxine 112 MCG tablet Commonly known as:  SYNTHROID, LEVOTHROID Take 1 tablet (112 mcg total) by mouth daily before breakfast. TAKE 2 TABLETS BY MOUTH ONCE DAILY IN THE MORNING ON AN EMPTY STOMACH.   lisinopril 10 MG tablet Commonly known as:  PRINIVIL,ZESTRIL TAKE 1 TABLET BY MOUTH EVERY DAY   simvastatin 20 MG tablet Commonly known as:  ZOCOR TAKE 1 TABLET BY MOUTH AT BEDTIME       Allergies: No Known Allergies  Past Medical History:  Diagnosis  Date  . Hyperlipidemia   . Hypertension   . Hypogonadism in male 11/01/2016  . Hypothyroidism   . Multiple sclerosis ([place])   . 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 Type 1 diabetes mellitus ([place]) dx'd 1994  . Vitamin D insufficiency 11/29/2016    Past Surgical History:  Procedure Laterality Date  . EYE SURGERY Bilateral    "laser for diabetic retinopathy"  . FRACTURE SURGERY    . OPEN REDUCTION INTERNAL FIXATION (ORIF) TIBIA/FIBULA FRACTURE Right 10/30/2016  . ORIF ANKLE FRACTURE Left 2015  . ORIF TIBIA FRACTURE Right 10/30/2016   Procedure: OPEN REDUCTION INTERNAL FIXATION (ORIF) TIBIA FIBULA FRACTURE;  Surgeon: Altamese [place], MD;  Location: [facility];  Service: Orthopedics;  Laterality: Right;  . RETINAL LASER PROCEDURE Bilateral    "for diabetic retinopathy"    Family History  Problem Relation Age of Onset  . Diabetes Sister   . Colon cancer Maternal Grandmother   . Colon polyps Maternal Uncle     Social History:  reports that he has never smoked. He has never used smokeless tobacco. He reports that he does not drink alcohol or use drugs.    Review of Systems   He is asking about his left ear feeling clogged    HYPERCHOLESTEROLEMIA: He has been taking simvastatin 20 mg consistently   LDL is below 100 consistently  Has no increase in ALT  Lab Results  Component Value Date   CHOL 124 03/31/2018   HDL 40.70 03/31/2018   LDLCALC 74 03/31/2018   LDLDIRECT 139.7 04/09/2014   TRIG 44.0 03/31/2018   CHOLHDL 3 03/31/2018   Lab Results  Component Value Date   ALT 20 03/31/2018    HYPERTENSION:  has  been on lisinopril with good control  of blood pressure    BP Readings from Last 3 Encounters:  08/27/18 128/70  07/07/18 (!) 143/71  07/07/18 132/80    He has MULTIPLE sclerosis, is followed in [place] by neurologist and still not on treatment  HYPOTHYROIDISM: This is long-standing  His thyroid requirement has changed periodically and has been taking  higher doses in 2018  His TSH has been extremely variable and not clear why it fluctuates between low and very high readings, outside labs indicate his TSH was low in early March from neurologist office  Weight is stable He is not feeling as sleepy recently with his increased levothyroxine dose in February Currently taking 224 mcg, 5 days a week and 112 mcg the other 2 days for the last 2 weeks or so   Lab Results  Component Value Date   TSH 31.67 (H) 06/30/2018   TSH 0.03 (L) 03/31/2018   TSH 1.12 12/23/2017   FREET4 0.60 06/30/2018   FREET4 1.36 03/31/2018   FREET4 1.22 12/23/2017        Objective:   Physical Exam  BP 128/70 (BP Location: Left Arm, Patient Position: Sitting, Cuff Size: Normal)   Pulse 74   Ht 6\' 1"  (1.854 m)   Wt 205 lb 6.4 oz (93.2 kg)   SpO2 97%   BMI 27.10 kg/m            Assessment:      DIABETES type I on insulin pump:  See history of present illness for detailed discussion of current diabetes management, blood sugar patterns and problems identified  He has persistently difficult to control diabetes with A1c 8.4 last Since he has not used his freestyle sensor very much not clear when his blood sugar readings are doing consistently However hypoglycemia has been minimal and only occasionally may have a tendency to lower readings overnight However blood sugars tend to be higher in the early afternoon and evenings Most likely is not bolusing consistently for all meals and snacks especially if the blood sugars are near normal   HYPOTHYROIDISM:  His thyroid levels have been quite variable and not clear why he has excessive fluctuations even with his compliance being fairly good  Lipids: Needs follow-up periodically  HYPERTENSION: His blood pressure is well controlled      Plan:      He needs to use the Dexcom sensor when he finishes using the freestyle libre for better performance, alarms for low and high sugars and more continuous  monitoring with accuracy He will check with his supplier about this In the meantime recommend that he BOLUS before eating consistently with every meal and carbohydrate intake even if blood sugars are below 100 If blood sugars are on the low side he can bolus right after eating but also cut back up to 50% Increase basal rate from 6 PM up to midnight and use 1.85 units Overnight basal rate will be reduced down to 0.95 between midnight-6 AM Sensitivity 1: 35 during the day and blood sugar target 130  THYROID: He will have his thyroid levels checked again today I asked him to let the neurologist know not to check his thyroid and this can be followed here more consistently in coordination with dosage changes  Hypertension: Mild and well controlled  Recommend that he establish with a PCP for general medical care   Counseling time on subjects discussed in assessment and plan sections is over 50% of today's 25 minute visit    Patient Instructions  Freestyle accessories armband  Take bolus even when blood sugars low normal to bolus at least 50%      Elayne Snare  Note: This office note was prepared with Estate agent. Any transcriptional errors that result from this process are unintentional.  Addendum: TSH still low, change prescription to 175 mcg daily

## 2018-08-27 NOTE — Patient Instructions (Signed)
Freestyle accessories armband  Take bolus even when blood sugars low normal to bolus at least 50%

## 2018-08-28 LAB — FRUCTOSAMINE: Fructosamine: 355 umol/L — ABNORMAL HIGH (ref 0–285)

## 2018-08-29 ENCOUNTER — Other Ambulatory Visit: Payer: Self-pay

## 2018-08-29 ENCOUNTER — Telehealth: Payer: Self-pay | Admitting: Endocrinology

## 2018-08-29 MED ORDER — LEVOTHYROXINE SODIUM 175 MCG PO TABS: ORAL_TABLET | ORAL | 3 refills | Status: DC

## 2018-08-29 NOTE — Telephone Encounter (Signed)
Called pt and gave him MD message that was sent through [facility].

## 2018-08-29 NOTE — Telephone Encounter (Signed)
Patient is returning Noah's call.

## 2018-09-04 ENCOUNTER — Other Ambulatory Visit: Payer: Self-pay | Admitting: Endocrinology

## 2018-09-15 ENCOUNTER — Telehealth: Payer: Self-pay | Admitting: Endocrinology

## 2018-09-15 NOTE — Telephone Encounter (Signed)
[facility] has called in regards to a document they have faxed over, refaxing to correct fax # [phone]

## 2018-09-19 NOTE — Telephone Encounter (Signed)
Pt was called and informed that we did fax some paperwork to [facility] on 09/08/2018 and if any additional paperwork has been received since then, it will be with the Dr and should be completed by Monday.

## 2018-09-19 NOTE — Telephone Encounter (Signed)
Patient is calling back about the information that should be sent to [employer].  He would like an update on the status

## 2018-09-22 NOTE — Telephone Encounter (Signed)
[place] and spoke with the rep that also spoke with the pt when he called them. Pt was informed by Oletta Lamas that the MD mistakenly checked the box to supply Dexcom supplies rather than [employer] supplies. They sent a request for the correction to be made, and this was received Friday 09/19/2018. Pt also called on Friday and inquired about this paperwork. Pt was informed that if we have received it, then the MD had it and he would be completing it this weekend, and it would be returned to [place] this week. Pt verbalized understanding and stated that he would check back with our office later this week.  [place] was informed that the MD did completed this corrected paperwork over the weekend, and will be faxed today.

## 2018-09-22 NOTE — Telephone Encounter (Signed)
[employer] has called stating they need not receive the patients progress notes.  Please Advise, Thanks

## 2018-09-22 NOTE — Telephone Encounter (Signed)
Patient called our office upset stating that he spoke with Olen Cordial on Friday stating the his shipment for supplies was good to go and now patient has received a call from [facility] stating we faxed the wrong information. Informed patient that to inform them to call us with that information because we can not fix it if they do not tell us. Patient became angry and stated we where all liers. Advised Olen Cordial is going to call [facility] for updates. Patients agreed and disconnected.

## 2018-09-22 NOTE — Telephone Encounter (Signed)
There was no request for progress notes made by [facility].  Notes will be printed and faxed.

## 2018-09-27 ENCOUNTER — Other Ambulatory Visit: Payer: Self-pay | Admitting: Endocrinology

## 2018-10-13 DIAGNOSIS — H1859 Other hereditary corneal dystrophies: Secondary | ICD-10-CM | POA: Diagnosis not present

## 2018-10-13 DIAGNOSIS — H35371 Puckering of macula, right eye: Secondary | ICD-10-CM | POA: Diagnosis not present

## 2018-10-13 DIAGNOSIS — H31009 Unspecified chorioretinal scars, unspecified eye: Secondary | ICD-10-CM | POA: Diagnosis not present

## 2018-10-13 DIAGNOSIS — E103553 Type 1 diabetes mellitus with stable proliferative diabetic retinopathy, bilateral: Secondary | ICD-10-CM | POA: Diagnosis not present

## 2018-10-13 LAB — HM DIABETES EYE EXAM

## 2018-10-14 ENCOUNTER — Other Ambulatory Visit: Payer: Self-pay

## 2018-10-14 DIAGNOSIS — E1065 Type 1 diabetes mellitus with hyperglycemia: Secondary | ICD-10-CM

## 2018-10-14 MED ORDER — GLUCOSE BLOOD VI STRP: ORAL_STRIP | 3 refills | Status: DC

## 2018-11-24 ENCOUNTER — Other Ambulatory Visit: Payer: Medicare Other

## 2018-11-27 ENCOUNTER — Other Ambulatory Visit: Payer: Self-pay

## 2018-11-27 ENCOUNTER — Other Ambulatory Visit (INDEPENDENT_AMBULATORY_CARE_PROVIDER_SITE_OTHER): Payer: Medicare Other

## 2018-11-27 ENCOUNTER — Ambulatory Visit: Payer: Medicare Other | Admitting: Endocrinology

## 2018-11-27 DIAGNOSIS — E1065 Type 1 diabetes mellitus with hyperglycemia: Secondary | ICD-10-CM | POA: Diagnosis not present

## 2018-11-27 LAB — COMPREHENSIVE METABOLIC PANEL
ALT: 19 U/L (ref 0–53)
AST: 20 U/L (ref 0–37)
Albumin: 4.4 g/dL (ref 3.5–5.2)
Alkaline Phosphatase: 110 U/L (ref 39–117)
BUN: 13 mg/dL (ref 6–23)
CO2: 28 mEq/L (ref 19–32)
Calcium: 10 mg/dL (ref 8.4–10.5)
Chloride: 99 mEq/L (ref 96–112)
Creatinine, Ser: 1.13 mg/dL (ref 0.40–1.50)
GFR: 84.42 mL/min (ref >60.00)
Glucose, Bld: 250 mg/dL — ABNORMAL HIGH (ref 70–99)
Potassium: 4.6 mEq/L (ref 3.5–5.1)
Sodium: 135 mEq/L (ref 135–145)
Total Bilirubin: 0.6 mg/dL (ref 0.2–1.2)
Total Protein: 7.4 g/dL (ref 6.0–8.3)

## 2018-11-27 LAB — LIPID PANEL
Cholesterol: 111 mg/dL (ref 0–200)
HDL: 40 mg/dL (ref >39.00)
LDL Cholesterol: 61 mg/dL (ref 0–99)
NonHDL: 71.08
Total CHOL/HDL Ratio: 3
Triglycerides: 48 mg/dL (ref 0.0–149.0)
VLDL: 9.6 mg/dL (ref 0.0–40.0)

## 2018-11-27 LAB — TSH: TSH: 0.01 u[IU]/mL — ABNORMAL LOW (ref 0.35–4.50)

## 2018-11-27 LAB — T4, FREE: Free T4: 2.24 ng/dL — ABNORMAL HIGH (ref 0.60–1.60)

## 2018-11-27 LAB — HEMOGLOBIN A1C: Hgb A1c MFr Bld: 8 % — ABNORMAL HIGH (ref 4.6–6.5)

## 2018-12-03 ENCOUNTER — Encounter: Payer: Self-pay | Admitting: Endocrinology

## 2018-12-03 ENCOUNTER — Other Ambulatory Visit: Payer: Self-pay

## 2018-12-03 ENCOUNTER — Ambulatory Visit (INDEPENDENT_AMBULATORY_CARE_PROVIDER_SITE_OTHER): Payer: Medicare Other | Admitting: Endocrinology

## 2018-12-03 DIAGNOSIS — E063 Autoimmune thyroiditis: Secondary | ICD-10-CM | POA: Diagnosis not present

## 2018-12-03 DIAGNOSIS — E1065 Type 1 diabetes mellitus with hyperglycemia: Secondary | ICD-10-CM

## 2018-12-03 MED ORDER — LEVOTHYROXINE SODIUM 137 MCG PO TABS: 137.0000 ug | ORAL_TABLET | Freq: Every day | ORAL | 3 refills | Status: DC

## 2018-12-03 NOTE — Progress Notes (Signed)
Subjective:              Patient ID: Johnathan Ross, male   DOB: 1971/02/02, 48 y.o.   MRN: [id number]  Diabetes   Diagnosis: Type 1 diabetes mellitus, date of diagnosis:  1995.   Insulin Pump followup:   CURRENT brand:  Medtronic  630 G   HISTORY: An insulin pump has been in use since 03/13/11.   PUMP settings are basal rate:   midnight = 0.875,4 AM = 2.875, 6 a.m.= 1.3, 7 AM = 1. 5 5,. 9 AM = 1.7,, 10 PM = 1.85  Carbohydrate to insulin ratio: 1: 8, at 11 a.m. = 1: 7, after 6 p.m. = 1:7, hyperglycemia correction factor: 1: 30,and after 10 p.m. 1:30, target 120, active insulin time 4 hours.   Recent history:  His A1c has usually been over 8% and now 8% compared to 8.4   Current blood sugar patterns Evaluated from freestyle Libre and pump download, management and problems identified:  Current management, blood sugar patterns and problems identified      He believes he has had a tendency to low sugars and he has reduced his basal rates overnight and also late evening  As discussed below his freestyle Elenor Legato data is incomplete in the last week because of using 2 different devices for monitoring his blood sugars which he still tries to do fairly regularly  However he still has not looked at the [facility] sensor that could have been more useful for his management  His pump download was available only for the 2 weeks prior to 7/2  Despite his stating that he is bolusing for every meal he has only limited amount of boluses and not every day, usually not at breakfast even with some carbohydrate intake  As before his higher blood sugars are mostly in the afternoons and variably in the evenings also   CONTINUOUS GLUCOSE MONITORING RECORD INTERPRETATION    Dates of Recording: 6/25-7/8  Sensor description:Libre  Results statistics:   CGM use % of time  69  Average and SD  167  Time in range       62 %  % Time Above 180  23  % Time above 250  12  % Time Below target  3    Glycemic patterns summary: He has significant blood sugar variability at all times Blood sugar data is incomplete in the last week from having only 1 device available for linking all the blood sugars are generally averaging about 150-180 most of the time he has had tendency to some mildly low sugars around 2 AM Hyperglycemia is likely postprandial with the highest readings midmorning and midafternoon and sporadically in the evening  Hyperglycemic episodes are occurring at variable times is mostly related to mealtimes and occasionally late at night  Hypoglycemic episodes occurred transiently usually around 2 AM on 2 of the nights  Overnight periods: Blood sugars are overall averaging about 150-170 but has more variability in the early part of the night, low sugars a couple of times as above Blood sugars are trending higher after 4 AM  Preprandial periods: Blood sugars are mildly increased at breakfast and lunch but variable at dinnertime  Postprandial periods:   After breakfast: Since he does not have boluses in the morning frequently not clear if he is eating a meal but he has occasional days where blood sugars are rising significantly in the morning After lunch:   Blood sugars are mostly running higher at least  40 to 50 mg after lunch  After dinner: Blood sugars are variable with average rise in blood sugar only about 30 mg   PRE-MEAL Fasting Lunch Dinner Bedtime Overall  Glucose range:       Mean/median:  166  172  153   167   POST-MEAL PC Breakfast PC Lunch PC Dinner  Glucose range:     Mean/median:  179  214  183     Wt Readings from Last 3 Encounters:  08/27/18 205 lb 6.4 oz (93.2 kg)  07/07/18 210 lb 6.4 oz (95.4 kg)  04/02/18 207 lb 3.2 oz (94 kg)     Lab Results  Component Value Date   HGBA1C 8.0 (H) 11/27/2018   HGBA1C 8.4 (H) 06/30/2018   HGBA1C 8.1 (H) 03/31/2018   Lab Results  Component Value Date   MICROALBUR 1.1 12/23/2017   LDLCALC 61 11/27/2018    CREATININE 1.13 11/27/2018     OTHER active problems discussed today: See review of systems    Allergies as of 12/03/2018   No Known Allergies     Medication List       Accurate as of December 03, 2018 11:58 AM. If you have any questions, ask your nurse or doctor.        Ampyra 10 MG Tb12 Generic drug: dalfampridine Take 10 mg by mouth 2 (two) times daily. Take one tablet (10 mg) by mouth every morning and mid afternoon   diclofenac 75 MG EC tablet Commonly known as: VOLTAREN Take 1 tablet (75 mg total) by mouth 2 (two) times daily.   glucose blood test strip Commonly known as: Contour Next Test USE AS INSTRUCTED TO CHECK BLOOD SUGARS 8 TIMES PER DAY DX CODE E10.65   insulin aspart 100 UNIT/ML injection Commonly known as: NovoLOG USE UP TO 80 UNITS IN INSULIN PUMP DAILY-DX CODE E10.8   insulin pump Soln Inject into the skin continuous. Novolog   levothyroxine 175 MCG tablet Commonly known as: SYNTHROID Take 1 tablet by once daily   lisinopril 10 MG tablet Commonly known as: ZESTRIL TAKE 1 TABLET BY MOUTH EVERY DAY   simvastatin 20 MG tablet Commonly known as: ZOCOR TAKE 1 TABLET BY MOUTH AT BEDTIME       Allergies: No Known Allergies  Past Medical History:  Diagnosis Date  . Hyperlipidemia   . Hypertension   . Hypogonadism in male 11/01/2016  . Hypothyroidism   . Multiple sclerosis ([place])   . 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 Type 1 diabetes mellitus ([place]) dx'd 1994  . Vitamin D insufficiency 11/29/2016    Past Surgical History:  Procedure Laterality Date  . EYE SURGERY Bilateral    "laser for diabetic retinopathy"  . FRACTURE SURGERY    . OPEN REDUCTION INTERNAL FIXATION (ORIF) TIBIA/FIBULA FRACTURE Right 10/30/2016  . ORIF ANKLE FRACTURE Left 2015  . ORIF TIBIA FRACTURE Right 10/30/2016   Procedure: OPEN REDUCTION INTERNAL FIXATION (ORIF) TIBIA FIBULA FRACTURE;  Surgeon: Altamese Dilworth, MD;  Location: [place];  Service: Orthopedics;   Laterality: Right;  . RETINAL LASER PROCEDURE Bilateral    "for diabetic retinopathy"    Family History  Problem Relation Age of Onset  . Diabetes Sister   . Colon cancer Maternal Grandmother   . Colon polyps Maternal Uncle     Social History:  reports that he has never smoked. He has never used smokeless tobacco. He reports that he does not drink alcohol or use drugs.    Review of Systems  He is asking about his left ear feeling clogged    HYPERCHOLESTEROLEMIA: He has been taking simvastatin 20 mg consistently   LDL is below 100 consistently  Has no increase in ALT  Lab Results  Component Value Date   CHOL 111 11/27/2018   HDL 40.00 11/27/2018   LDLCALC 61 11/27/2018   LDLDIRECT 139.7 04/09/2014   TRIG 48.0 11/27/2018   CHOLHDL 3 11/27/2018   Lab Results  Component Value Date   ALT 19 11/27/2018    HYPERTENSION:  has  been on lisinopril with good control  of blood pressure    BP Readings from Last 3 Encounters:  08/27/18 128/70  07/07/18 (!) 143/71  07/07/18 132/80    He has MULTIPLE sclerosis, is followed in [place] by neurologist and still not on treatment  HYPOTHYROIDISM: He has had longstanding primary 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 autoimmune  His thyroid dosage has changed periodically and has had to change his regimen with every visit  His TSH has been extremely variable and not clear why it fluctuates between low and very high readings He also does not feel any different whether his thyroid level is high or low Currently with his TSH being persistently low he has not felt jittery or have any symptoms of weight loss or palpitations  His dose has been reduced to 175 mcg and he takes only 1 tablet before breakfast daily as prescribed since his last visit in April   Lab Results  Component Value Date   TSH 0.01 (L) 11/27/2018   TSH 0.04 (L) 08/27/2018   TSH 31.67 (H) 06/30/2018   FREET4 2.24 (H) 11/27/2018   FREET4 0.95 08/27/2018   FREET4 0.60  06/30/2018        Objective:   Physical Exam  There were no vitals taken for this visit.           Assessment:      DIABETES type I on insulin pump:  See history of present illness for detailed discussion of current diabetes management, blood sugar patterns and problems identified  He has persistently difficult to control diabetes with A1c 8 and only slightly better  His data from the freestyle Elenor Legato is somewhat incomplete because of only downloading 1 device However even though he thinks he is bolusing consistently his pump data indicates he is only bolusing randomly at some meals or for high sugars including sometimes at night He also appears to have more frequent hypoglycemia periodically overnight  Only rarely has low sugars are related to correction of high readings later in the evening Highest blood sugars appear to be mostly in the early afternoon with some variability Again overall his blood sugar patterns are quite variable with the limited data available  HYPOTHYROIDISM:  TSH is persistently low and now his free T4 is increased also There has been no change in his compliance with his medication  Lipids: Well controlled with statins  HYPERTENSION: His blood pressure will be checked on his next visit   Plan:      He will use a single device to monitor his freestyle libre reading Consider tandem pump when he is due for an upgrade Discussed need to consistently enter his carbohydrates and bolus for every meal including breakfast Carbohydrate ratio 1: 5 at lunch Reduce midnight basal rate down to 0.8 until 4 AM 4 AM basal rate 0.925 He will let us know if he is still having tendency to low sugars overnight Also make sure he boluses before starting to eat with  his meals especially at lunch  HYPOTHYROIDISM: He will reduce the dose of 137 mcg  Lipids: Continue simvastatin 20 mg  Follow-up in 2 months   Counseling time on subjects discussed in assessment and  plan sections is over 50% of today's 25 minute visit    There are no Patient Instructions on file for this visit.     Elayne Snare  Note: This office note was prepared with Estate agent. Any transcriptional errors that result from this process are unintentional.  Addendum: TSH still low, change prescription to 175 mcg daily

## 2018-12-15 DIAGNOSIS — Z79899 Other long term (current) drug therapy: Secondary | ICD-10-CM | POA: Diagnosis not present

## 2018-12-15 DIAGNOSIS — G35 Multiple sclerosis: Secondary | ICD-10-CM | POA: Diagnosis not present

## 2018-12-15 DIAGNOSIS — R5383 Other fatigue: Secondary | ICD-10-CM | POA: Diagnosis not present

## 2018-12-15 DIAGNOSIS — M62838 Other muscle spasm: Secondary | ICD-10-CM | POA: Diagnosis not present

## 2018-12-15 DIAGNOSIS — Z5181 Encounter for therapeutic drug level monitoring: Secondary | ICD-10-CM | POA: Diagnosis not present

## 2018-12-28 ENCOUNTER — Other Ambulatory Visit: Payer: Self-pay | Admitting: Endocrinology

## 2019-01-05 DIAGNOSIS — G35 Multiple sclerosis: Secondary | ICD-10-CM | POA: Diagnosis not present

## 2019-01-06 DIAGNOSIS — G35 Multiple sclerosis: Secondary | ICD-10-CM | POA: Diagnosis not present

## 2019-01-11 ENCOUNTER — Other Ambulatory Visit: Payer: Self-pay | Admitting: Endocrinology

## 2019-01-26 ENCOUNTER — Other Ambulatory Visit: Payer: Self-pay | Admitting: Endocrinology

## 2019-01-28 ENCOUNTER — Telehealth: Payer: Self-pay | Admitting: Endocrinology

## 2019-01-28 ENCOUNTER — Ambulatory Visit: Payer: Medicare Other | Admitting: Endocrinology

## 2019-01-30 ENCOUNTER — Other Ambulatory Visit: Payer: Self-pay

## 2019-01-30 ENCOUNTER — Telehealth: Payer: Self-pay

## 2019-01-30 ENCOUNTER — Encounter: Payer: Self-pay | Admitting: Physical Therapy

## 2019-01-30 ENCOUNTER — Ambulatory Visit: Payer: Medicare Other | Attending: Urology | Admitting: Physical Therapy

## 2019-01-30 ENCOUNTER — Telehealth: Payer: Self-pay | Admitting: Endocrinology

## 2019-01-30 DIAGNOSIS — R2681 Unsteadiness on feet: Secondary | ICD-10-CM

## 2019-01-30 DIAGNOSIS — M21371 Foot drop, right foot: Secondary | ICD-10-CM | POA: Diagnosis not present

## 2019-01-30 DIAGNOSIS — R296 Repeated falls: Secondary | ICD-10-CM | POA: Diagnosis not present

## 2019-01-30 DIAGNOSIS — M6281 Muscle weakness (generalized): Secondary | ICD-10-CM | POA: Diagnosis not present

## 2019-01-30 DIAGNOSIS — M21372 Foot drop, left foot: Secondary | ICD-10-CM | POA: Insufficient documentation

## 2019-01-30 DIAGNOSIS — R262 Difficulty in walking, not elsewhere classified: Secondary | ICD-10-CM | POA: Insufficient documentation

## 2019-01-30 DIAGNOSIS — R2689 Other abnormalities of gait and mobility: Secondary | ICD-10-CM | POA: Diagnosis not present

## 2019-01-30 NOTE — Telephone Encounter (Signed)
Patients sister called and is requesting to talk to Dr. Dwyane Dee about her brothers thyroid-patient is tired all the time and feels that the MD is only testing thyroid levels when the patient asks about it and not when it needs to be done-sister also reported that the study the patient is in for MS he is taking [place]  and has his blood drawn every month for 5 years  the results are sent to our office and the patients sister wants to know if MD is seeing the results-sister stated the lab results have been all over the place and wants to know why MD is not trying to get thyroid levels under control-please call Amada Jupiter at [phone] this is the patients sister she is asking to speak directly to the doctor

## 2019-01-30 NOTE — Telephone Encounter (Signed)
Noted  

## 2019-01-30 NOTE — Telephone Encounter (Signed)
FYI  Patients DPR has been completed and posted in patients chart.

## 2019-01-30 NOTE — Therapy (Signed)
[facility] [address] [place], [place], [address] Phone: [phone]   Fax:  [phone]  Physical Therapy Evaluation  Patient Details  Name: Johnathan Ross MRN: [id number] Date of Birth: Oct 12, 1970 Referring Provider (PT): Carylon Perches, MD   Encounter Date: 01/30/2019  PT End of Session - 01/30/19 0959    Visit Number  1    Number of Visits  17    Date for PT Re-Evaluation  03/31/19    Authorization Type  Medicare Part A and B; 10th visit PN    PT Start Time  0849    PT Stop Time  0940    PT Time Calculation (min)  51 min    Activity Tolerance  Patient limited by fatigue    Behavior During Therapy  [facility] for tasks assessed/performed       Past Medical History:  Diagnosis Date  . Hyperlipidemia   . Hypertension   . Hypogonadism in male 11/01/2016  . Hypothyroidism   . Multiple sclerosis ([place])   . 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 Type 1 diabetes mellitus ([place]) dx'd 1994  . Vitamin D insufficiency 11/29/2016    Past Surgical History:  Procedure Laterality Date  . EYE SURGERY Bilateral    "laser for diabetic retinopathy"  . FRACTURE SURGERY    . OPEN REDUCTION INTERNAL FIXATION (ORIF) TIBIA/FIBULA FRACTURE Right 10/30/2016  . ORIF ANKLE FRACTURE Left 2015  . ORIF TIBIA FRACTURE Right 10/30/2016   Procedure: OPEN REDUCTION INTERNAL FIXATION (ORIF) TIBIA FIBULA FRACTURE;  Surgeon: Altamese Danville, MD;  Location: [place] [facility];  Service: Orthopedics;  Laterality: Right;  . RETINAL LASER PROCEDURE Bilateral    "for diabetic retinopathy"    There were no vitals filed for this visit.   Subjective Assessment - 01/30/19 0856    Subjective  Pt reports recently he has been having increased difficulty with lifting/bending his RLE.  Having to use his hands to lift his RLE when going up the stairs.  Tried to keep taking baclofen but it made him very sleepy so stopped taking it.  No other changes to medication.     Pertinent History  multiple falls, vitamin D insufficiency, Type 1 IDDM, diabetic retinopathy, hypothyroidism, HTN, hyperlipidemia, R tibia fracture, L ankle fracture with ORIF    Limitations  Walking    Diagnostic tests  No new lesions seen on MRI    Patient Stated Goals  To improve walking    Currently in Pain?  No/denies         [facility] PT Assessment - 01/30/19 0857      Assessment   Medical Diagnosis  MS    Referring Provider (PT)  Carylon Perches, MD    Onset Date/Surgical Date  12/15/18    Prior Therapy  yes, outpatient PT following fall with ankle fracture      Precautions   Precautions  Other (comment)    Precaution Comments  multiple falls, vitamin D insufficiency, Type 1 IDDM, diabetic retinopathy, hypothyroidism, HTN, hyperlipidemia, R tibia fracture, L ankle fracture with ORIF    Required Braces or Orthoses  Other Brace/Splint    Other Brace/Splint  AFO RLE      Restrictions   Weight Bearing Restrictions  No      Balance Screen   Has the patient fallen in the past 6 months  Yes    How Ross times?  multiple      [place] residence  Living Arrangements  Other (Comment)    Available Help at Discharge  Family    Type of [place] to enter    Entrance Stairs-Number of Steps  6    Entrance Stairs-Rails  Can reach both    [place]  One level    [place] - 2 wheels;[place] - single point      Prior Function   Level of Independence  Independent    Leisure  Does a lot of wood working in his building      Observation/Other Assessments   Focus on Therapeutic Outcomes (FOTO)   N/A      Coordination   Gross Motor Movements are Fluid and Coordinated  No      Tone   Assessment Location  Right Lower Extremity;Left Lower Extremity      ROM / Strength   AROM / PROM / Strength  Strength      Strength   Overall Strength  Deficits    Overall Strength Comments  LLE: hip flexion 4-/5, knee  extension 4/5, knee flexion 4-/5, ankle DF 3/5.  RLE: hip flexion 3+/5, knee extension 4-/5, knee flexion 3+/5, ankle DF 2/5      Ambulation/Gait   Ambulation/Gait  Yes    Ambulation/Gait Assistance  5: Supervision    Ambulation Distance (Feet)  115 Feet    Assistive device  Rolling walker    Gait Pattern  Step-to pattern;Decreased step length - right;Decreased stride length;Decreased hip/knee flexion - right;Decreased dorsiflexion - right;Right hip hike;Right genu recurvatum;Poor foot clearance - right    Ambulation Surface  Level;Indoor      Standardized Balance Assessment   Standardized Balance Assessment  Berg Balance Test;10 meter walk test    10 Meter Walk  45.81 seconds with RW/AFO and  0.71 ft/sec      Berg Balance Test   Sit to Stand  Able to stand using hands after several tries    Standing Unsupported  Able to stand 2 minutes with supervision    Sitting with Back Unsupported but Feet Supported on Floor or Stool  Able to sit safely and securely 2 minutes    Stand to Sit  Controls descent by using hands    Transfers  Able to transfer safely, definite need of hands    Standing Unsupported with Eyes Closed  Able to stand 10 seconds with supervision    Standing Unsupported with Feet Together  Needs help to attain position but able to stand for 30 seconds with feet together    From Standing, Reach Forward with Outstretched Arm  Can reach confidently >25 cm (10")    From Standing Position, Pick up Object from Floor  Able to pick up shoe, needs supervision    From Standing Position, Turn to Look Behind Over each Shoulder  Turn sideways only but maintains balance    Turn 360 Degrees  Needs assistance while turning    Standing Unsupported, Alternately Place Feet on Step/Stool  Needs assistance to keep from falling or unable to try    Standing Unsupported, One Foot in Front  Needs help to step but can hold 15 seconds    Standing on One Leg  Unable to try or needs assist to prevent fall     Total Score  29    Berg comment:  29/56      RLE Tone   RLE Tone  Moderate;Hypertonic;Modified Ashworth  RLE Tone   Hypertonic Details  proximal hip, extensor tone    Modified Ashworth Scale for Grading Hypertonia RLE  More marked increase in muscle tone through most of the ROM, but affected part(s) easily moved      LLE Tone   LLE Tone  Moderate;Hypertonic;Modified Ashworth      LLE Tone   Hypertonic Details  proximal hip, extensor tone    Modified Ashworth Scale for Grading Hypertonia LLE  More marked increase in muscle tone through most of the ROM, but affected part(s) easily moved                Objective measurements completed on examination: See above findings.              PT Education - 01/30/19 0959    Education Details  clinical findings, PT POC, goals    Person(s) Educated  Patient    Methods  Explanation    Comprehension  Verbalized understanding       PT Short Term Goals - 01/30/19 1004      PT SHORT TERM GOAL #1   Title  Patient will demonstrate independence with updated HEP    Time  4    Period  Weeks    Status  New    Target Date  03/01/19      PT SHORT TERM GOAL #2   Title  Pt will improve gait velocity with RW to >/= 1.0 ft/sec with decreased use of UE to lift and swing RLE through    Baseline  .71 ft/sec with RW/AFO    Time  4    Period  Weeks    Status  New    Target Date  03/01/19      PT SHORT TERM GOAL #3   Title  Pt will improve BERG to >/= 33/56    Baseline  29/56    Time  4    Period  Weeks    Status  New    Target Date  03/01/19      PT SHORT TERM GOAL #4   Title  Pt will ambulate x 230' over indoor surfaces with RW MOD I with improved R hip and knee flexion and foot clearance without lifting through UE    Time  4    Period  Weeks    Status  New    Target Date  03/01/19      PT SHORT TERM GOAL #5   Title  Pt will negotate 8 stairs with two rails step to sequence with supervision    Time  4    Period   Weeks    Status  New    Target Date  03/01/19        PT Long Term Goals - 01/30/19 1007      PT LONG TERM GOAL #1   Title  Pt will demonstrate independence with land and aquatic HEP and will report going to [facility] 1-2x/week for ongoing fitness    Time  8    Period  Weeks    Status  New    Target Date  03/31/19      PT LONG TERM GOAL #2   Title  Pt will improve gait velocity with RW to >/= 1.8 ft/sec    Time  8    Period  Weeks    Status  New    Target Date  03/31/19      PT LONG TERM GOAL #3  Title  Pt will improve BERG to >/= 37/56 to decrease falls risk    Time  8    Period  Weeks    Status  New    Target Date  03/31/19      PT LONG TERM GOAL #4   Title  Pt will ambulate x 200' outside over pavement and grass with supervision and LRAD to improve safety ambulating to wood working shop at home    Time  8    Period  Weeks    Status  New    Target Date  03/31/19      PT LONG TERM GOAL #5   Title  Pt will negotiate 8 stairs with 2 rails with alternating sequence with supervision    Time  8    Period  Weeks    Status  New    Target Date  03/31/19             Plan - 01/30/19 1000    Clinical Impression Statement  Pt is a 48 year old male well known to this clinic referred to Neuro OPPT for evaluation of spasticity and gait abnormalities related to MS.  Pt's PMH is significant for the following: multiple falls, vitamin D insufficiency, diabetic retinopathy, hypothyroidism, HTN, hyperlipidemia, R tibia fracture, L ankle fracture with ORIF. The following deficits were noted during pt's exam: increase extensor tone and spasticity, decreased strength R > L weakness, R foot drop and foot drag, impaired trunk control, impaired balance and impaired gait.  Pt's gait velocity and BERG score indicate pt is at high risk for falls. Pt would benefit from skilled PT to address these impairments and functional limitations to maximize functional mobility independence and reduce falls  risk.    Personal Factors and Comorbidities  Comorbidity 3+;Past/Current Experience    Comorbidities  multiple falls, vitamin D insufficiency, Type 1 IDDM, diabetic retinopathy, hypothyroidism, HTN, hyperlipidemia, R tibia fracture, L ankle fracture with ORIF    Examination-Activity Limitations  Carry;Locomotion Level;Stairs;Stand;Transfers;Squat    Examination-Participation Restrictions  Community Activity;Driving;Yard Work    [employer]chant [employer] officer  Evolving/Moderate complexity    Clinical Decision Making  Moderate    Rehab Potential  Good    PT Frequency  2x / week    PT Duration  8 weeks    PT Treatment/Interventions  ADLs/Self Care Home Management;Aquatic Therapy;Electrical Stimulation;DME Instruction;Gait training;Stair training;Functional mobility training;Therapeutic activities;Therapeutic exercise;Balance training;Neuromuscular re-education;Patient/family education;Orthotic Fit/Training;Passive range of motion    PT Next Visit Plan  Review HEP - need to add knee to chest stretch, adductor stretch, gastroc stretch for tone management.  Endurance - Nustep?  Continue to focus on core/postural control training without UE support, hip flexion and knee flexion strengthening, ankle DF strength.  SLS and weight shift training (rockerboard).    Recommended Other Services  Will discuss Botox with neurologist; aquatic    Consulted and Agree with Plan of Care  Patient       Patient will benefit from skilled therapeutic intervention in order to improve the following deficits and impairments:  Abnormal gait, Decreased balance, Decreased endurance, Decreased coordination, Decreased range of motion, Decreased strength, Difficulty walking, Impaired tone, Postural dysfunction  Visit Diagnosis: Repeated falls  Foot drop, right  Other abnormalities of gait and mobility  Muscle weakness (generalized)  Unsteadiness on feet     Problem List Patient Active Problem List   Diagnosis  Date Noted  . Vitamin D insufficiency 11/29/2016  . Hypogonadism in male 11/01/2016  . Type 1  diabetes mellitus ([place])   . Hypothyroidism   . Hypertension   . Hyperlipidemia   . Closed fracture of right fibula and tibia 10/29/2016  . Closed tibia fracture 10/29/2016  . Closed fracture of right tibial plafond with fibula involvement 10/29/2016  . Abnormal liver function tests 03/06/2013  . RASH AND OTHER NONSPECIFIC SKIN ERUPTION 10/07/2009  . SHOULDER PAIN, LEFT 12/27/2008  . Type I (juvenile type) diabetes mellitus without mention of complication, uncontrolled 12/03/2007  . HYPOTHYROIDISM 11/12/2007  . HYPERCHOLESTEROLEMIA 11/12/2007  . MULTIPLE SCLEROSIS 08/29/2007  . PROLIFERATIVE DIABETIC RETINOPATHY 08/29/2007  . HYPERTENSION 12/25/2006    [name], PT, DPT 01/30/19    10:15 AM    [facility] [address] [place], [place], [address] Phone: [phone]   Fax:  [phone]  Name: Johnathan Ross MRN: [id number] Date of Birth: Oct 27, 1970

## 2019-02-01 NOTE — Telephone Encounter (Signed)
Please let her know that I have talked to Johnathan Ross regularly about this.  We are adjusting his thyroid dose regularly based on his labs and this is what needs to be done.  However we can now try brand-name Synthroid if he can afford this.  Need to see what his last labs were

## 2019-02-03 ENCOUNTER — Telehealth: Payer: Self-pay | Admitting: Endocrinology

## 2019-02-03 NOTE — Telephone Encounter (Signed)
Have already spoken to pt's sister linda regarding the reason for calling the pt. She will inform him of everything discussed.

## 2019-02-03 NOTE — Telephone Encounter (Signed)
error 

## 2019-02-03 NOTE — Telephone Encounter (Signed)
Called pt's sister and spoke with her regarding pt and her concerns. Pt's sister [name] states that the pt has lost weight and we should be aware of this. She was informed that after reviewing the pt's chart, there is not a large variance in weight over the last year. It was noted that a weight was not checked for the last visit because it was a virtual visit. Pt's sister did then stated that his current weight is approx 185lbs. She then stated that it should be noted that he feels bad. [name] was then informed that we cannot go off of anything other than what the patient tells [place] when it comes to how he feels, so if he tells the MD that he feels well and is not experiencing fatigue, then the MD has nothing else to go off of. This is why it is documented as such in his chart. [name] then stated that the pt usually feels bad during the day, but here recently, he has felt even worse and has been severely fatigued and takes a nap mid-day most of the time. [name] was given Dr. Ronnie Derby message about the name brand Synthroid, and she will contact insurance to find out about this. She will also make sure that the pt reschedules his blood work at [facility], and they will fax it to [place] when it is resulted. [name] was informed that when the blood work is received, I will make sure Dr. Dwyane Dee is notified and addresses it sooner rather than later. [name] verbalized understanding and she was encouraged to call back if she has any future questions or concerns.

## 2019-02-03 NOTE — Telephone Encounter (Signed)
Pt returning call

## 2019-02-04 ENCOUNTER — Telehealth: Payer: Self-pay | Admitting: Endocrinology

## 2019-02-04 NOTE — Telephone Encounter (Signed)
Patient requests that Johnathan Ross call patient at ph# [phone] so that Johnathan Ross can send him a link so that patient can send his [employer]  to [place] for upcoming virtual appointment. Patient states he needs to speak with Johnathan Ross anyway.

## 2019-02-04 NOTE — Telephone Encounter (Signed)
Called pt and he stated that he needed assistance uploading his pump to carelink in order to do a virtual visit. Could you please assist with this?

## 2019-02-04 NOTE — Telephone Encounter (Signed)
Pt has been referred to L.Spagnola,RN,CDE and she stated that she would handle this for the pt.

## 2019-02-04 NOTE — Telephone Encounter (Signed)
Patient is needing the link to download his [place] for his appointment on Monday.  Please Advise, Thanks

## 2019-02-05 ENCOUNTER — Other Ambulatory Visit: Payer: Self-pay

## 2019-02-05 ENCOUNTER — Encounter: Payer: Self-pay | Admitting: Physical Therapy

## 2019-02-05 ENCOUNTER — Ambulatory Visit: Payer: Medicare Other | Admitting: Physical Therapy

## 2019-02-05 DIAGNOSIS — M21372 Foot drop, left foot: Secondary | ICD-10-CM | POA: Diagnosis not present

## 2019-02-05 DIAGNOSIS — R296 Repeated falls: Secondary | ICD-10-CM

## 2019-02-05 DIAGNOSIS — R262 Difficulty in walking, not elsewhere classified: Secondary | ICD-10-CM

## 2019-02-05 DIAGNOSIS — R2689 Other abnormalities of gait and mobility: Secondary | ICD-10-CM | POA: Diagnosis not present

## 2019-02-05 DIAGNOSIS — R2681 Unsteadiness on feet: Secondary | ICD-10-CM

## 2019-02-05 DIAGNOSIS — M21371 Foot drop, right foot: Secondary | ICD-10-CM | POA: Diagnosis not present

## 2019-02-05 DIAGNOSIS — M6281 Muscle weakness (generalized): Secondary | ICD-10-CM

## 2019-02-05 NOTE — Patient Instructions (Addendum)
  Access Code: [email or web address]  URL: [email or web address]  Date: 02/05/2019  Prepared by: Misty Stanley   Exercises Quadruped Hip Extension Kicks - 5 reps - 2 sets - 1x daily - 5x weekly Side Plank on Knees - 5 reps - 2 sets - 1x daily - 5x weekly Tall Kneeling Chop - 10 reps - 2 sets - 1x daily - 5x weekly Standing Alternating Knee Flexion - 10 reps - 2 sets - 1x daily - 5x weekly Marching Bridge - 5 reps - 2 sets - 1x daily - 5x weekly Standing Quarter Turn with Counter Support - 3 sets - 1x daily - 5x weekly Standing Single Leg Stance with Counter Support - 4 sets - 30 second hold - 2x daily - 5x weekly Child's Pose Stretch - 3 sets - 20 second hold - 1x daily - 7x weekly Hip Adductors and Hamstring Stretch with Strap - 2 sets - 20 second hold - 1x daily - 7x weekly Knee tucks - 5 reps - 2 sets - 1x daily - 7x weekly

## 2019-02-05 NOTE — Therapy (Addendum)
[facility] [address] [place], [place], [address] Phone: [phone]   Fax:  [phone]  Physical Therapy Treatment  Patient Details  Name: Johnathan Ross MRN: [id number] Date of Birth: 1970-10-08 Referring Provider (PT): Carylon Perches, MD   Encounter Date: 02/05/2019  PT End of Session - 02/05/19 1343    Visit Number  2    Number of Visits  17    Date for PT Re-Evaluation  03/31/19    Authorization Type  Medicare Part A and B; 10th visit PN    PT Start Time  0800    PT Stop Time  0849    PT Time Calculation (min)  49 min    Activity Tolerance  Patient limited by fatigue    Behavior During Therapy  [facility] for tasks assessed/performed       Past Medical History:  Diagnosis Date  . Hyperlipidemia   . Hypertension   . Hypogonadism in male 11/01/2016  . Hypothyroidism   . Multiple sclerosis ([place])   . 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 Type 1 diabetes mellitus ([place]) dx'd 1994  . Vitamin D insufficiency 11/29/2016    Past Surgical History:  Procedure Laterality Date  . EYE SURGERY Bilateral    "laser for diabetic retinopathy"  . FRACTURE SURGERY    . OPEN REDUCTION INTERNAL FIXATION (ORIF) TIBIA/FIBULA FRACTURE Right 10/30/2016  . ORIF ANKLE FRACTURE Left 2015  . ORIF TIBIA FRACTURE Right 10/30/2016   Procedure: OPEN REDUCTION INTERNAL FIXATION (ORIF) TIBIA FIBULA FRACTURE;  Surgeon: Altamese Roanoke, MD;  Location: [facility];  Service: Orthopedics;  Laterality: Right;  . RETINAL LASER PROCEDURE Bilateral    "for diabetic retinopathy"    There were no vitals filed for this visit.  Subjective Assessment - 02/05/19 0808    Subjective  Pt drove self here today and then walked all the way in so more fatigued this morning.  Near fall to R while walking into treatment area required therapist and wall to regain balance.    Pertinent History  multiple falls, vitamin D insufficiency, Type 1 IDDM, diabetic  retinopathy, hypothyroidism, HTN, hyperlipidemia, R tibia fracture, L ankle fracture with ORIF    Limitations  Walking    Diagnostic tests  No new lesions seen on MRI    Patient Stated Goals  To improve walking    Currently in Pain?  No/denies       Continued to discuss other medical issues and how thyroid and blood glucose control may be also affecting patient's fatigue level, stability, balance and muscle strength.  Pt encouraged to continue to discuss with physician for best management options.  Initiated review and revision of HEP: Access Code: [email or web address]  URL: [email or web address]  Date: 02/05/2019  Prepared by: Misty Stanley   Exercises added and reviewed today with greater focus on core and LE flexion to counter act extensor tone:  Child's Pose Stretch - 3 sets - 20 second hold - 1x daily - 7x weekly Hip Adductors and Hamstring Stretch with Strap - 2 sets - 20 second hold - 1x daily - 7x weekly Knee tucks - 5 reps - 2 sets - 1x daily - 7x weekly Quadruped Hip Extension Kicks - 5 reps - 2 sets - 1x daily - 5x weekly Side Plank on Knees - 5 reps - 2 sets - 1x daily - 5x weekly   Will continue to review at next session:  Tall Kneeling Chop - 10 reps - 2 sets -  1x daily - 5x weekly Standing Alternating Knee Flexion - 10 reps - 2 sets - 1x daily - 5x weekly Marching Bridge - 5 reps - 2 sets - 1x daily - 5x weekly Standing Quarter Turn with Counter Support - 3 sets - 1x daily - 5x weekly Standing Single Leg Stance with Counter Support - 4 sets - 30 second hold - 2x daily - 5x weekly                         PT Education - 02/05/19 1342    Education Details  continued to discuss how fluctuations in thyroid level can affect fatigue and progress; updated HEP    Person(s) Educated  Patient    Methods  Explanation;Demonstration;Handout    Comprehension  Verbalized understanding;Returned demonstration       PT Short Term Goals - 01/30/19 1004       PT SHORT TERM GOAL #1   Title  Patient will demonstrate independence with updated HEP    Time  4    Period  Weeks    Status  New    Target Date  03/01/19      PT SHORT TERM GOAL #2   Title  Pt will improve gait velocity with RW to >/= 1.0 ft/sec with decreased use of UE to lift and swing RLE through    Baseline  .71 ft/sec with RW/AFO    Time  4    Period  Weeks    Status  New    Target Date  03/01/19      PT SHORT TERM GOAL #3   Title  Pt will improve BERG to >/= 33/56    Baseline  29/56    Time  4    Period  Weeks    Status  New    Target Date  03/01/19      PT SHORT TERM GOAL #4   Title  Pt will ambulate x 230' over indoor surfaces with RW MOD I with improved R hip and knee flexion and foot clearance without lifting through UE    Time  4    Period  Weeks    Status  New    Target Date  03/01/19      PT SHORT TERM GOAL #5   Title  Pt will negotate 8 stairs with two rails step to sequence with supervision    Time  4    Period  Weeks    Status  New    Target Date  03/01/19        PT Long Term Goals - 01/30/19 1007      PT LONG TERM GOAL #1   Title  Pt will demonstrate independence with land and aquatic HEP and will report going to [facility] 1-2x/week for ongoing fitness    Time  8    Period  Weeks    Status  New    Target Date  03/31/19      PT LONG TERM GOAL #2   Title  Pt will improve gait velocity with RW to >/= 1.8 ft/sec    Time  8    Period  Weeks    Status  New    Target Date  03/31/19      PT LONG TERM GOAL #3   Title  Pt will improve BERG to >/= 37/56 to decrease falls risk    Time  8    Period  Weeks  Status  New    Target Date  03/31/19      PT LONG TERM GOAL #4   Title  Pt will ambulate x 200' outside over pavement and grass with supervision and LRAD to improve safety ambulating to wood working shop at home    Time  8    Period  Weeks    Status  New    Target Date  03/31/19      PT LONG TERM GOAL #5   Title  Pt will negotiate 8 stairs  with 2 rails with alternating sequence with supervision    Time  8    Period  Weeks    Status  New    Target Date  03/31/19            Plan - 02/05/19 1344    Clinical Impression Statement  Pt continues to demonstrate significant fatigue affecting ability to perform gait safely; while standing pt had one significant LOB to R requiring max A from therapist to regain balance.  Treatment session focused on adjustment of HEP to include stretches and core strengthening exercises with greater focus on flexion and decreasing influence of extensor tone.  Pt fatigued after 2-3 reps and required frequent rest breaks.  Will continue to monitor and to address in order to improve safety and decrease falls risk.    Personal Factors and Comorbidities  Comorbidity 3+;Past/Current Experience    Comorbidities  multiple falls, vitamin D insufficiency, Type 1 IDDM, diabetic retinopathy, hypothyroidism, HTN, hyperlipidemia, R tibia fracture, L ankle fracture with ORIF    Examination-Activity Limitations  Carry;Locomotion Level;Stairs;Stand;Transfers;Squat    Examination-Participation Restrictions  Community Activity;Driving;Yard Work    [employer]chant [employer] officer  Evolving/Moderate complexity    Rehab Potential  Good    PT Frequency  2x / week    PT Duration  8 weeks    PT Treatment/Interventions  ADLs/Self Care Home Management;Aquatic Therapy;Electrical Stimulation;DME Instruction;Gait training;Stair training;Functional mobility training;Therapeutic activities;Therapeutic exercise;Balance training;Neuromuscular re-education;Patient/family education;Orthotic Fit/Training;Passive range of motion    PT Next Visit Plan  Continue to Review HEP and make revisions focusing on flexion/core strength, hip flexion, knee flexion.   Endurance - Nustep?  Continue to focus on core/postural control training without UE support, hip flexion and knee flexion strengthening, ankle DF strength.  SLS and weight shift training  (rockerboard).    Recommended Other Services  Aquatic    Consulted and Agree with Plan of Care  Patient       Patient will benefit from skilled therapeutic intervention in order to improve the following deficits and impairments:  Abnormal gait, Decreased balance, Decreased endurance, Decreased coordination, Decreased range of motion, Decreased strength, Difficulty walking, Impaired tone, Postural dysfunction  Visit Diagnosis: Repeated falls  Foot drop, right  Other abnormalities of gait and mobility  Muscle weakness (generalized)  Unsteadiness on feet  Difficulty in walking, not elsewhere classified  Foot drop, left     Problem List Patient Active Problem List   Diagnosis Date Noted  . Vitamin D insufficiency 11/29/2016  . Hypogonadism in male 11/01/2016  . Type 1 diabetes mellitus ([place])   . Hypothyroidism   . Hypertension   . Hyperlipidemia   . Closed fracture of right fibula and tibia 10/29/2016  . Closed tibia fracture 10/29/2016  . Closed fracture of right tibial plafond with fibula involvement 10/29/2016  . Abnormal liver function tests 03/06/2013  . RASH AND OTHER NONSPECIFIC SKIN ERUPTION 10/07/2009  . SHOULDER PAIN, LEFT 12/27/2008  . Type I (juvenile  type) diabetes mellitus without mention of complication, uncontrolled 12/03/2007  . HYPOTHYROIDISM 11/12/2007  . HYPERCHOLESTEROLEMIA 11/12/2007  . MULTIPLE SCLEROSIS 08/29/2007  . PROLIFERATIVE DIABETIC RETINOPATHY 08/29/2007  . HYPERTENSION 12/25/2006    [name], PT, DPT 02/05/19    1:52 PM    [facility] [address] [place], [place], [address] Phone: [phone]   Fax:  [phone]  Name: KENDERRICK MUSARRA MRN: [id number] Date of Birth: [birth year]

## 2019-02-06 ENCOUNTER — Telehealth: Payer: Self-pay

## 2019-02-06 ENCOUNTER — Other Ambulatory Visit: Payer: Medicare Other

## 2019-02-06 NOTE — Telephone Encounter (Signed)
Verified that patient was linked to [facility] in order to upload insulin pump for virtual visit Monday. Pt is still not linked.

## 2019-02-08 NOTE — Telephone Encounter (Signed)
I will call him Monday morning to get his user name and password and link him to our account and teach him how to download.

## 2019-02-08 NOTE — Telephone Encounter (Signed)
I will call him Monday to link him to our account and teach him again how to download

## 2019-02-09 ENCOUNTER — Telehealth: Payer: Self-pay | Admitting: Endocrinology

## 2019-02-09 ENCOUNTER — Encounter: Payer: Self-pay | Admitting: Endocrinology

## 2019-02-09 ENCOUNTER — Other Ambulatory Visit: Payer: Self-pay

## 2019-02-09 ENCOUNTER — Ambulatory Visit (INDEPENDENT_AMBULATORY_CARE_PROVIDER_SITE_OTHER): Payer: Medicare Other | Admitting: Endocrinology

## 2019-02-09 DIAGNOSIS — E1065 Type 1 diabetes mellitus with hyperglycemia: Secondary | ICD-10-CM

## 2019-02-09 DIAGNOSIS — E063 Autoimmune thyroiditis: Secondary | ICD-10-CM | POA: Diagnosis not present

## 2019-02-09 NOTE — Telephone Encounter (Signed)
Message left on home phone to call me with user name and password for [facility] for me to link him from here.  Also gave directions on how to download his pump. Patient called back and user name and password put in 7 times and not able to link.  Pt. Was called back and he reverbalize user name and password, and I was still not able to link him.  [place] notified

## 2019-02-09 NOTE — Progress Notes (Signed)
Subjective:              Patient ID: Johnathan Ross, male   DOB: [birth year], 48 y.o.   MRN: [id number]  Today's office visit was provided via telemedicine using video technique The patient was explained the limitations of evaluation and management by telemedicine and the availability of in person appointments.  The patient understood the limitations and agreed to proceed. Patient also understood that the telehealth visit is billable. . Location of the patient: Patient's home . Location of the provider: Physician office Only the patient and myself were participating in the encounter    Diabetes   Diagnosis: Type 1 diabetes mellitus, date of diagnosis:  1995.   Insulin Pump followup:   CURRENT brand:  Medtronic  630 G   HISTORY: An insulin pump has been in use since 03/13/11.   PUMP settings are basal rate:   midnight = 0.875,4 AM = 2.875, 6 a.m.= 1.3, 7 AM = 1. 5 5,. 9 AM = 1.7,, 10 PM = 1.85  Carbohydrate to insulin ratio: 1: 8, at 11 a.m. = 1: 7, after 6 p.m. = 1:7, hyperglycemia correction factor: 1: 30,and after 10 p.m. 1:30, target 120, active insulin time 4 hours.   Recent history:  His A1c has usually been over 8% and last 8%   Current blood sugar patterns Evaluated from freestyle Libre and pump download, management and problems identified:  Current management, blood sugar patterns and problems identified     He does not have any data for his freestyle libre except 1 blood sugar day in the last 4 days  This is despite his saying that he is using his freestyle libre reader most of the time to check his sugar and not his smart phone  Also he still has not called about switching to [facility]  Despite reducing his overnight basal he still feels that he has low sugars during the night  With this he has suspended his pump overnight almost every night except a couple of nights only in the last 2 weeks to prevent low blood sugar  Despite this he has had HYPOGLYCEMIA  during the night twice on his freestyle libre recording  Usually only rare hypoglycemia in the afternoon possibly from over bolusing  Even though he is likely eating some carbohydrate at breakfast, he does not appear to have consistent boluses except correction boluses in the mornings  Blood sugars around suppertime although generally a little high  He is also not entering his blood sugars at the time of the bolus at dinnertime to enable correction  CGM use % of time  80  2-week average/SD  160  Time in range     64   %  % Time Above 180  26  % Time above 250  7  % Time Below 70  3     PRE-MEAL Fasting Lunch Dinner Bedtime Overall  Glucose range:       Averages:  169  184  151  150  160, GV 35   POST-MEAL PC Breakfast PC Lunch PC Dinner  Glucose range:     Averages:    156   AVERAGE 2-4 AM = 124    PREVIOUS data:   CGM use % of time  69  Average and SD  167  Time in range       62 %  % Time Above 180  23  % Time above 250  12  % Time Below target  3      PRE-MEAL Fasting Lunch Dinner Bedtime Overall  Glucose range:       Mean/median:  166  172  153   167   POST-MEAL PC Breakfast PC Lunch PC Dinner  Glucose range:     Mean/median:  179  214  183     Wt Readings from Last 3 Encounters:  08/27/18 205 lb 6.4 oz (93.2 kg)  07/07/18 210 lb 6.4 oz (95.4 kg)  04/02/18 207 lb 3.2 oz (94 kg)     Lab Results  Component Value Date   HGBA1C 8.0 (H) 11/27/2018   HGBA1C 8.4 (H) 06/30/2018   HGBA1C 8.1 (H) 03/31/2018   Lab Results  Component Value Date   MICROALBUR 1.1 12/23/2017   LDLCALC 61 11/27/2018   CREATININE 1.13 11/27/2018     OTHER active problems discussed today: See review of systems    Allergies as of 02/09/2019   No Known Allergies     Medication List       Accurate as of February 09, 2019  1:00 PM. If you have any questions, ask your nurse or doctor.        Ampyra 10 MG Tb12 Generic drug: dalfampridine Take 10 mg by mouth 2 (two)  times daily. Take one tablet (10 mg) by mouth every morning and mid afternoon   diclofenac 75 MG EC tablet Commonly known as: VOLTAREN Take 1 tablet (75 mg total) by mouth 2 (two) times daily.   glucose blood test strip Commonly known as: Contour Next Test USE AS INSTRUCTED TO CHECK BLOOD SUGARS 8 TIMES PER DAY DX CODE E10.65   insulin aspart 100 UNIT/ML injection Commonly known as: NovoLOG USE UP TO 80 UNITS IN INSULIN PUMP DAILY-DX CODE E10.8   insulin pump Soln Inject into the skin continuous. Novolog   levothyroxine 137 MCG tablet Commonly known as: Synthroid Take 1 tablet (137 mcg total) by mouth daily before breakfast.   levothyroxine 112 MCG tablet Commonly known as: SYNTHROID TAKE 2 TABLETS BY MOUTH ONCE DAILY IN THE MORNING ON AN EMPTY STOMACH.   lisinopril 10 MG tablet Commonly known as: ZESTRIL TAKE 1 TABLET BY MOUTH EVERY DAY   simvastatin 20 MG tablet Commonly known as: ZOCOR TAKE 1 TABLET BY MOUTH AT BEDTIME       Allergies: No Known Allergies  Past Medical History:  Diagnosis Date  . Hyperlipidemia   . Hypertension   . Hypogonadism in male 11/01/2016  . Hypothyroidism   . Multiple sclerosis ([place])   . 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 Type 1 diabetes mellitus ([place]) dx'd 1994  . Vitamin D insufficiency 11/29/2016    Past Surgical History:  Procedure Laterality Date  . EYE SURGERY Bilateral    "laser for diabetic retinopathy"  . FRACTURE SURGERY    . OPEN REDUCTION INTERNAL FIXATION (ORIF) TIBIA/FIBULA FRACTURE Right 10/30/2016  . ORIF ANKLE FRACTURE Left 2015  . ORIF TIBIA FRACTURE Right 10/30/2016   Procedure: OPEN REDUCTION INTERNAL FIXATION (ORIF) TIBIA FIBULA FRACTURE;  Surgeon: Altamese Armour, MD;  Location: [facility];  Service: Orthopedics;  Laterality: Right;  . RETINAL LASER PROCEDURE Bilateral    "for diabetic retinopathy"    Family History  Problem Relation Age of Onset  . Diabetes Sister   . Colon cancer Maternal Grandmother    . Colon polyps Maternal Uncle     Social History:  reports that he has never smoked. He has never used smokeless tobacco. He reports that he does not  drink alcohol or use drugs.    Review of Systems      HYPERCHOLESTEROLEMIA: He has been taking simvastatin 20 mg consistently   LDL is below 100 consistently  Has no increase in ALT  Lab Results  Component Value Date   CHOL 111 11/27/2018   HDL 40.00 11/27/2018   LDLCALC 61 11/27/2018   LDLDIRECT 139.7 04/09/2014   TRIG 48.0 11/27/2018   CHOLHDL 3 11/27/2018   Lab Results  Component Value Date   ALT 19 11/27/2018    HYPERTENSION:  has  been on lisinopril with good control  of blood pressure    BP Readings from Last 3 Encounters:  08/27/18 128/70  07/07/18 (!) 143/71  07/07/18 132/80    He has MULTIPLE sclerosis, is followed in [place] by neurologist and still not on treatment  HYPOTHYROIDISM: He has had longstanding primary 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 autoimmune  His thyroid dosage has changed periodically and has had to change his regimen with every visit  His TSH has been extremely variable and not clear why it fluctuates between low and very high readings He also does not feel any different whether his thyroid level is high or low Currently with his TSH being persistently low he has not felt jittery or have any symptoms of weight loss or palpitations  His dose has been reduced to 137 mcg because of persistently low TSH levels since April and he continues to have a low TSH, recently 0.02 No free T4 available Lab drawn by neurologist at [facility]   Lab Results  Component Value Date   TSH 0.01 (L) 11/27/2018   TSH 0.04 (L) 08/27/2018   TSH 31.67 (H) 06/30/2018   FREET4 2.24 (H) 11/27/2018   FREET4 0.95 08/27/2018   FREET4 0.60 06/30/2018        Objective:   Physical Exam  There were no vitals taken for this visit.           Assessment:      DIABETES type I on insulin pump:  See history of  present illness for detailed discussion of current diabetes management, blood sugar patterns and problems identified  He has persistently difficult to control diabetes with A1c 8 in July  This is despite his blood sugars averaging only about 160-170 on his freestyle libre tracings are not clear the readings are accurate No labs available recently to objectively assess his control  His data from the freestyle Elenor [place] is still somewhat incomplete and not clear why  However he appears to be requiring much less insulin overnight since even with suspending his pump for several hours his blood sugars are only averaging in the 160s in the mornings Blood sugars are usually rising after about 3 AM gradually during the night  Hyperglycemia is mostly during the day although does have variability Some of this may be from inadequate bolusing especially at breakfast but his evening meal coverage appears to be generally adequate    HYPOTHYROIDISM:  TSH is persistently low Free T4 not available He has been consistent with his medication in the past and has been on as much as 175 mcg, now taking 137 Patient will be contacted again to verify what dose he is actually taking and reduce it by 12.5 mcg Free T4 on the next visit  Lipids: Has been well controlled with statins  HYPERTENSION: Needs office visit, his blood pressure will be checked on his next visit   Plan:     BASAL rate changes: Midnight = 0.1, 2  AM = 0.2, 4 AM = 0.4, 9 AM = 1.85 until 6 PM, 6 PM-midnight = 1.7 He will try to get the Dexcom sensor Bolus consistently as directed Needs to enter blood sugars for his dinner meal to enable correction Also make sure he boluses for all meals and carbohydrate intake in the mornings and lunchtime   HYPOTHYROIDISM: He will reduce the dose from 137 mcg down to 125 mcg  Follow-up 1 month with labs   Counseling time on subjects discussed in assessment and plan sections is over 50% of today's 25  minute visit    There are no Patient Instructions on file for this visit.     Elayne Snare  Note: This office note was prepared with Estate agent. Any transcriptional errors that result from this process are unintentional.

## 2019-02-09 NOTE — Telephone Encounter (Signed)
Patient states that he called about going to the [facility] that Dr.Kumar requested. His insurance states since they already ran his insurance for the [employer] they will have to wait 3 years to run it again for the Dexcom. They stated we could get it for the patient.  Please Advise, Thanks

## 2019-02-09 NOTE — Telephone Encounter (Signed)
I do not see why he has to wait 3 years, Dexcom is covered by Medicare.  Needs to talk to the [employer] representative to help Also please call him to see what thyroid dose he is actually taking because there are 2 conflicting active prescriptions on his record

## 2019-02-10 ENCOUNTER — Other Ambulatory Visit: Payer: Self-pay

## 2019-02-10 MED ORDER — LEVOTHYROXINE SODIUM 125 MCG PO TABS: 125.0000 ug | ORAL_TABLET | Freq: Every day | ORAL | 3 refills | Status: DC

## 2019-02-10 NOTE — Telephone Encounter (Signed)
Called and left detailed voicemail for pt informing him of MD message and medication dose change. Also sent new Rx and removed old dosage.

## 2019-02-10 NOTE — Telephone Encounter (Signed)
Confirm that he is only taking 1 tablet of the 137 and not 2 as listed.  He will now take 125 mcg daily.  Please remove discontinued prescriptions

## 2019-02-10 NOTE — Telephone Encounter (Signed)
Called and left a voicemail for [place] with [employer] requesting a callback in order to find out if the pt is eligible for Dexcom.

## 2019-02-10 NOTE — Telephone Encounter (Signed)
Called pt and confirmed dose of levothyroxine. Pt stated that he has been taking 170mcg on a regular basis, not the 144mcg.

## 2019-02-10 NOTE — Telephone Encounter (Signed)
When you call to do his COVID screen, can you clarify what he is needing from primary care at this point? I am not going to be able to take over anything Dr. Dwyane Dee has been managing for him. Just want to make sure there are realistic expectations about what he thinks we can manage for him.

## 2019-02-11 ENCOUNTER — Encounter: Payer: Self-pay | Admitting: Family

## 2019-02-11 ENCOUNTER — Ambulatory Visit (INDEPENDENT_AMBULATORY_CARE_PROVIDER_SITE_OTHER): Payer: Medicare Other | Admitting: Family

## 2019-02-11 ENCOUNTER — Other Ambulatory Visit: Payer: Self-pay

## 2019-02-11 VITALS — BP 114/78 | HR 90 | Temp 98.0°F | Ht 73.0 in | Wt 195.6 lb

## 2019-02-11 DIAGNOSIS — Z23 Encounter for immunization: Secondary | ICD-10-CM | POA: Diagnosis not present

## 2019-02-11 DIAGNOSIS — G35 Multiple sclerosis: Secondary | ICD-10-CM | POA: Diagnosis not present

## 2019-02-11 DIAGNOSIS — E039 Hypothyroidism, unspecified: Secondary | ICD-10-CM

## 2019-02-11 DIAGNOSIS — E1065 Type 1 diabetes mellitus with hyperglycemia: Secondary | ICD-10-CM

## 2019-02-11 DIAGNOSIS — I10 Essential (primary) hypertension: Secondary | ICD-10-CM | POA: Diagnosis not present

## 2019-02-11 DIAGNOSIS — E782 Mixed hyperlipidemia: Secondary | ICD-10-CM | POA: Diagnosis not present

## 2019-02-11 NOTE — Progress Notes (Signed)
Johnathan Ross is a 48 y.o. male with the following history as recorded in EpicCare:  Patient Active Problem List   Diagnosis Date Noted  . Vitamin D insufficiency 11/29/2016  . Hypogonadism in male 11/01/2016  . Type 1 diabetes mellitus ([place])   . Hypothyroidism   . Hypertension   . Hyperlipidemia   . Closed fracture of right fibula and tibia 10/29/2016  . Closed tibia fracture 10/29/2016  . Closed fracture of right tibial plafond with fibula involvement 10/29/2016  . Abnormal liver function tests 03/06/2013  . RASH AND OTHER NONSPECIFIC SKIN ERUPTION 10/07/2009  . SHOULDER PAIN, LEFT 12/27/2008  . Type I (juvenile type) diabetes mellitus without mention of complication, uncontrolled 12/03/2007  . HYPOTHYROIDISM 11/12/2007  . HYPERCHOLESTEROLEMIA 11/12/2007  . MULTIPLE SCLEROSIS 08/29/2007  . PROLIFERATIVE DIABETIC RETINOPATHY 08/29/2007  . HYPERTENSION 12/25/2006    Current Outpatient Medications  Medication Sig Dispense Refill  . glucose blood (CONTOUR NEXT TEST) test strip USE AS INSTRUCTED TO CHECK BLOOD SUGARS 8 TIMES PER DAY DX CODE E10.65 300 each 3  . insulin aspart (NOVOLOG) 100 UNIT/ML injection USE UP TO 80 UNITS IN INSULIN PUMP DAILY-DX CODE E10.8 80 mL 2  . Insulin Human (INSULIN PUMP) SOLN Inject into the skin continuous. Novolog    . levothyroxine (SYNTHROID) 125 MCG tablet Take 1 tablet (125 mcg total) by mouth daily before breakfast. Take 1 tablet by mouth once daily. 30 tablet 3  . lisinopril (PRINIVIL,ZESTRIL) 10 MG tablet TAKE 1 TABLET BY MOUTH EVERY DAY 90 tablet 0  . simvastatin (ZOCOR) 20 MG tablet TAKE 1 TABLET BY MOUTH AT BEDTIME 90 tablet 1  . dalfampridine (AMPYRA) 10 MG TB12 Take 10 mg by mouth 2 (two) times daily. Take one tablet (10 mg) by mouth every morning and mid afternoon      No current facility-administered medications for this visit.     Allergies: Patient has no known allergies.  Past Medical History:  Diagnosis Date  . Hyperlipidemia    . Hypertension   . Hypogonadism in male 11/01/2016  . Hypothyroidism   . Multiple sclerosis ([place])   . 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 Type 1 diabetes mellitus ([place]) dx'd 1994  . Vitamin D insufficiency 11/29/2016    Past Surgical History:  Procedure Laterality Date  . EYE SURGERY Bilateral    "laser for diabetic retinopathy"  . FRACTURE SURGERY    . OPEN REDUCTION INTERNAL FIXATION (ORIF) TIBIA/FIBULA FRACTURE Right 10/30/2016  . ORIF ANKLE FRACTURE Left 2015  . ORIF TIBIA FRACTURE Right 10/30/2016   Procedure: OPEN REDUCTION INTERNAL FIXATION (ORIF) TIBIA FIBULA FRACTURE;  Surgeon: Altamese Cedartown, MD;  Location: [facility];  Service: Orthopedics;  Laterality: Right;  . RETINAL LASER PROCEDURE Bilateral    "for diabetic retinopathy"    Family History  Problem Relation Age of Onset  . Diabetes Sister   . Colon cancer Maternal Grandmother   . Colon polyps Maternal Uncle     Social History   Tobacco Use  . Smoking status: Never Smoker  . Smokeless tobacco: Never Used  Substance Use Topics  . Alcohol use: No    Subjective:  Presents today as a new patient; complicated medical history: 1) Type 1 Diabetes- working with endocrinology; last Hgba1c was 8.0 in July; history of MS- working with neurology at [facility]; Was told by his endocrinologist that he just needed to have a primary care provider in case he got sick;    Objective:  Vitals:  02/11/19 0942  BP: 114/78  Pulse: 90  Temp: 98 F (36.7 C)  TempSrc: Oral  SpO2: 98%  Weight: 195 lb 9.6 oz (88.7 kg)  Height: 6\' 1"  (1.854 m)    General: Well developed, well nourished, in no acute distress  Skin : Warm and dry.  Head: Normocephalic and atraumatic  Lungs: Respirations unlabored; clear to auscultation bilaterally without wheeze, rales, rhonchi  CVS exam: normal rate and regular rhythm.  Neurologic: Alert and oriented; speech intact; face symmetrical;  Uses walker;    Assessment:  1. Flu vaccine  need   2. Uncontrolled type 1 diabetes mellitus with hyperglycemia ([place])   3. Essential hypertension   4. Mixed hyperlipidemia   5. Hypothyroidism, unspecified type   6. MULTIPLE SCLEROSIS     Plan:  He will continue with endocrinologist and neurologist as scheduled; have asked patient to reach out to his neurologist to discuss potential complications from MS including muscle spasticity ( ? Need for Botox) and urinary retention; Flu shot is updated today; Return to office in 1 year, sooner prn.  30+ minutes with patient reviewing notes, symptoms of concern, discussing treatment options.  No follow-ups on file.  Orders Placed This Encounter  Procedures  . Flu Vaccine QUAD 36+ mos IM    Requested Prescriptions    No prescriptions requested or ordered in this encounter

## 2019-02-12 ENCOUNTER — Encounter: Payer: Self-pay | Admitting: Physical Therapy

## 2019-02-12 ENCOUNTER — Ambulatory Visit: Payer: Medicare Other | Admitting: Physical Therapy

## 2019-02-12 DIAGNOSIS — R2681 Unsteadiness on feet: Secondary | ICD-10-CM | POA: Diagnosis not present

## 2019-02-12 DIAGNOSIS — M21372 Foot drop, left foot: Secondary | ICD-10-CM

## 2019-02-12 DIAGNOSIS — R2689 Other abnormalities of gait and mobility: Secondary | ICD-10-CM

## 2019-02-12 DIAGNOSIS — M6281 Muscle weakness (generalized): Secondary | ICD-10-CM

## 2019-02-12 DIAGNOSIS — M21371 Foot drop, right foot: Secondary | ICD-10-CM

## 2019-02-12 DIAGNOSIS — R296 Repeated falls: Secondary | ICD-10-CM

## 2019-02-12 NOTE — Patient Instructions (Signed)
Access Code: [email or web address]  URL: [email or web address]  Date: 02/12/2019  Prepared by: Willow Ora   Exercises Marching Bridge - 5 reps - 2 sets - 1x daily - 5x weekly Curl Up with Reach - 10 reps - 1 sets - 5 hold - 1x daily - 5x weekly     Diagonal Curl Up with Reach - 10 reps - 1 sets - 1x daily - 5x weekly Hip Adductors and Hamstring Stretch with Strap - 2 sets - 20 second hold - 1x daily - 7x weekly Knee tucks - 5 reps - 2 sets - 1x daily - 7x weekly Quadruped Hip Extension Kicks - 5 reps - 2 sets - 1x daily - 5x weekly Child's Pose Stretch - 3 sets - 20 second hold - 1x daily - 7x weekly Side Plank on Knees - 5 reps - 2 sets - 1x daily - 5x weekly Heel Sits - 10 reps - 1 sets - 1x daily - 5x weekly Tall Kneeling Diagonal Lift with Medicine Ball - 5-10 reps - 1 sets - 1x daily - 5x weekly

## 2019-02-13 ENCOUNTER — Other Ambulatory Visit: Payer: Self-pay

## 2019-02-13 MED ORDER — INSULIN LISPRO 100 UNIT/ML ~~LOC~~ SOLN: SUBCUTANEOUS | 3 refills | Status: DC

## 2019-02-13 NOTE — Therapy (Signed)
[facility] [address] [place], [place], [address] Phone: [phone]   Fax:  [phone]  Physical Therapy Treatment  Patient Details  Name: Johnathan Ross MRN: [id number] Date of Birth: [birth year] Referring Provider (PT): Carylon Perches, MD   Encounter Date: 02/12/2019  PT End of Session - 02/12/19 0809    Visit Number  3    Number of Visits  17    Date for PT Re-Evaluation  03/31/19    Authorization Type  Medicare Part A and B; 10th visit PN    PT Start Time  0802    PT Stop Time  0845    PT Time Calculation (min)  43 min    Activity Tolerance  Patient limited by fatigue    Behavior During Therapy  [facility] for tasks assessed/performed       Past Medical History:  Diagnosis Date  . Hyperlipidemia   . Hypertension   . Hypogonadism in male 11/01/2016  . Hypothyroidism   . Multiple sclerosis ([place])   . 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 Type 1 diabetes mellitus ([place]) dx'd 1994  . Vitamin D insufficiency 11/29/2016    Past Surgical History:  Procedure Laterality Date  . EYE SURGERY Bilateral    "laser for diabetic retinopathy"  . FRACTURE SURGERY    . OPEN REDUCTION INTERNAL FIXATION (ORIF) TIBIA/FIBULA FRACTURE Right 10/30/2016  . ORIF ANKLE FRACTURE Left 2015  . ORIF TIBIA FRACTURE Right 10/30/2016   Procedure: OPEN REDUCTION INTERNAL FIXATION (ORIF) TIBIA FIBULA FRACTURE;  Surgeon: Altamese [place]ooka, MD;  Location: [facility];  Service: Orthopedics;  Laterality: Right;  . RETINAL LASER PROCEDURE Bilateral    "for diabetic retinopathy"    There were no vitals filed for this visit.  Subjective Assessment - 02/12/19 0806    Subjective  Started talking to neurology about Botox to decrease extension/tone. Feels this is what causes him to fall as he can't bend his knees.    Pertinent History  multiple falls, vitamin D insufficiency, Type 1 IDDM, diabetic retinopathy, hypothyroidism, HTN, hyperlipidemia, R  tibia fracture, L ankle fracture with ORIF    Limitations  Walking    Diagnostic tests  No new lesions seen on MRI    Patient Stated Goals  To improve walking    Currently in Pain?  No/denies            [facility] Adult PT Treatment/Exercise - 02/12/19 1630      Transfers   Transfers  Sit to Stand;Stand to Sit    Sit to Stand  5: Supervision;With upper extremity assist;From bed;From chair/3-in-1    Stand to Sit  5: Supervision;With upper extremity assist;To bed;To chair/3-in-1    Comments  increased time and effort as pt fatigues      Ambulation/Gait   Ambulation/Gait  Yes    Ambulation/Gait Assistance  5: Supervision    Ambulation/Gait Assistance Details  multiple standing rest breaks both into and out of gym. no balance loss today.     Ambulation Distance (Feet)  70 Feet   x2   Assistive device  Rolling walker    Gait Pattern  Step-to pattern;Decreased step length - right;Decreased stride length;Decreased hip/knee flexion - right;Decreased dorsiflexion - right;Right hip hike;Right genu recurvatum;Poor foot clearance - right    Ambulation Surface  Level;Indoor      Exercises   Exercises  Other Exercises    Other Exercises   continued with revision of pt's HEP. Refer to [place] program for  full details. min guard assist for safety with quadruped and tall kneeling ex's. cues on form and technique needed. multiple rest breaks throughout ex's needed due to fatigue.            PT Short Term Goals - 01/30/19 1004      PT SHORT TERM GOAL #1   Title  Patient will demonstrate independence with updated HEP    Time  4    Period  Weeks    Status  New    Target Date  03/01/19      PT SHORT TERM GOAL #2   Title  Pt will improve gait velocity with RW to >/= 1.0 ft/sec with decreased use of UE to lift and swing RLE through    Baseline  .71 ft/sec with RW/AFO    Time  4    Period  Weeks    Status  New    Target Date  03/01/19      PT SHORT TERM GOAL #3   Title  Pt will improve  BERG to >/= 33/56    Baseline  29/56    Time  4    Period  Weeks    Status  New    Target Date  03/01/19      PT SHORT TERM GOAL #4   Title  Pt will ambulate x 230' over indoor surfaces with RW MOD I with improved R hip and knee flexion and foot clearance without lifting through UE    Time  4    Period  Weeks    Status  New    Target Date  03/01/19      PT SHORT TERM GOAL #5   Title  Pt will negotate 8 stairs with two rails step to sequence with supervision    Time  4    Period  Weeks    Status  New    Target Date  03/01/19        PT Long Term Goals - 01/30/19 1007      PT LONG TERM GOAL #1   Title  Pt will demonstrate independence with land and aquatic HEP and will report going to [facility] 1-2x/week for ongoing fitness    Time  8    Period  Weeks    Status  New    Target Date  03/31/19      PT LONG TERM GOAL #2   Title  Pt will improve gait velocity with RW to >/= 1.8 ft/sec    Time  8    Period  Weeks    Status  New    Target Date  03/31/19      PT LONG TERM GOAL #3   Title  Pt will improve BERG to >/= 37/56 to decrease falls risk    Time  8    Period  Weeks    Status  New    Target Date  03/31/19      PT LONG TERM GOAL #4   Title  Pt will ambulate x 200' outside over pavement and grass with supervision and LRAD to improve safety ambulating to wood working shop at home    Time  8    Period  Weeks    Status  New    Target Date  03/31/19      PT LONG TERM GOAL #5   Title  Pt will negotiate 8 stairs with 2 rails with alternating sequence with supervision    Time  8  Period  Weeks    Status  New    Target Date  03/31/19            Plan - 02/12/19 0809    Clinical Impression Statement  Today's skilled session continued to focus on revision of pt's HEP with emphasis on flexion to decrease influence of extensor tone. No issues reported in session with performance today. The pt is progressing toward goals. continues to be limited by fatigue, and should  benefit from continued PT to progress toward unmet goals.    Personal Factors and Comorbidities  Comorbidity 3+;Past/Current Experience    Comorbidities  multiple falls, vitamin D insufficiency, Type 1 IDDM, diabetic retinopathy, hypothyroidism, HTN, hyperlipidemia, R tibia fracture, L ankle fracture with ORIF    Examination-Activity Limitations  Carry;Locomotion Level;Stairs;Stand;Transfers;Squat    Examination-Participation Restrictions  Community Activity;Driving;Yard Work    Merchant navy officer  Evolving/Moderate complexity    Rehab Potential  Good    PT Frequency  2x / week    PT Duration  8 weeks    PT Treatment/Interventions  ADLs/Self Care Home Management;Aquatic Therapy;Electrical Stimulation;DME Instruction;Gait training;Stair training;Functional mobility training;Therapeutic activities;Therapeutic exercise;Balance training;Neuromuscular re-education;Patient/family education;Orthotic Fit/Training;Passive range of motion    PT Next Visit Plan  Endurance - Nustep?  Continue to focus on core/postural control training without UE support, hip flexion and knee flexion strengthening, ankle DF strength.  SLS and weight shift training (rockerboard).    Consulted and Agree with Plan of Care  Patient       Patient will benefit from skilled therapeutic intervention in order to improve the following deficits and impairments:  Abnormal gait, Decreased balance, Decreased endurance, Decreased coordination, Decreased range of motion, Decreased strength, Difficulty walking, Impaired tone, Postural dysfunction  Visit Diagnosis: Repeated falls  Foot drop, right  Other abnormalities of gait and mobility  Muscle weakness (generalized)  Unsteadiness on feet  Foot drop, left     Problem List Patient Active Problem List   Diagnosis Date Noted  . Vitamin D insufficiency 11/29/2016  . Hypogonadism in male 11/01/2016  . Type 1 diabetes mellitus ([place])   . Hypothyroidism   .  Hypertension   . Hyperlipidemia   . Closed fracture of right fibula and tibia 10/29/2016  . Closed tibia fracture 10/29/2016  . Closed fracture of right tibial plafond with fibula involvement 10/29/2016  . Abnormal liver function tests 03/06/2013  . RASH AND OTHER NONSPECIFIC SKIN ERUPTION 10/07/2009  . SHOULDER PAIN, LEFT 12/27/2008  . Type I (juvenile type) diabetes mellitus without mention of complication, uncontrolled 12/03/2007  . HYPOTHYROIDISM 11/12/2007  . HYPERCHOLESTEROLEMIA 11/12/2007  . MULTIPLE SCLEROSIS 08/29/2007  . PROLIFERATIVE DIABETIC RETINOPATHY 08/29/2007  . HYPERTENSION 12/25/2006    Willow Ora, PTA, [facility] [address] [place], [place] [address] [phone] 02/13/19, 2:07 PM   Name: Johnathan Ross MRN: [id number] Date of Birth: [birth year]

## 2019-02-16 ENCOUNTER — Telehealth: Payer: Self-pay | Admitting: Endocrinology

## 2019-02-16 NOTE — Telephone Encounter (Signed)
Synthroid 125 mcg daily, d.a.w.

## 2019-02-16 NOTE — Telephone Encounter (Signed)
Called pt's sister [name] again. Spoke with her for the last 45 minutes regarding the patients thyroid condition. [name] stated that his neurologist has been doing blood work once a month for the last several years to check his thyroid function, since the MS medication he was taking can have effects that mess with the thyroid function. [name] continued to voice her concerns about why Dr. Dwyane Dee does not address the lab values each month when they are sent. After reviewing the pt's chart, [name] was informed that we do receive lab work from [facility] on a semi-consistent basis, but rarely does it include a TSH value. Most of the time it is only a CBC and a UA. She verbalized understanding of this.[name] was asked to speak with the pt about uploading his blood work himself from his neurologist from this point forward with his thyroid values on it. This would ensure that he has proof that Dr. Dwyane Dee received his TSH levels, and it would provide a clear picture about his treatment plan as it relates back to his thyroid functions. [name] stated that she can ensure that the pt begins uploading his monthly blood work from this point forward. [name] also voiced concerns about the fact the Dr. Dwyane Dee did not speak to the pt in his last visit about name brand Synthroid. [name] stated that she felt as though it was not a concern for Dr. Dwyane Dee. [name] was then informed that just because it was not addressed does not mean it is not a concern for him, and [name] was also informed that she would need to check with pt's insurance before sending this prescription. [name] did state during the previous conversation that they would find a way to get the pt the name brand if he needs it, but she questioned why it was not change to name brand. I informed her that even though she stated previously that she would find a way to get the name brand, we were still under the impression that we would be receiving a call back informing us of whether she would  like Dr. Dwyane Dee to send a prescription for name brand Synthroid. [name] was also advised that from this point forward, it may be beneficial for her to be present in the pt's office visits since she is so heavily involved as it is. This would eliminate her getting second hand information and having to call consistently to clarify. She did agree and stated that she would make every attempt to start doing this. Also spoke with [name] regarding the [facility] system and explained to her the difference in the Dexcom system and the [facility] system, which the patient currently has. I explained that I needed to get in touch with the Dexcom rep and find out exactly what needs to  Be done or can be done to make sure the pt can obtain the Dexcom system. [name] stated that she would like a call back once this information is obtained. I explained the process of switching to a new system and how the process of getting supplies from a DME company works. She verbalized understanding of this. At this time, the conversation concluded, and the pt's sister did not voice any more questions or concerns. Dr. Dwyane Dee was informed of the details of this conversation.

## 2019-02-16 NOTE — Telephone Encounter (Signed)
Patients sister [name] called requesting to speak with Olen Cordial in regards to the patients new RX.  Please Advise, Thanks

## 2019-02-16 NOTE — Telephone Encounter (Signed)
Attempted to pt's sister, [name] at the number listed below. She did not answer, and she has a Research scientist (medical) that is full. Will attempt to call again later.

## 2019-02-16 NOTE — Telephone Encounter (Signed)
Pt called and stated that he spoke with his insurance company and they will cover name brand Synthroid. He would like for Dr. Dwyane Dee to start prescribing name brand. Is this okay?

## 2019-02-17 ENCOUNTER — Other Ambulatory Visit: Payer: Self-pay

## 2019-02-17 ENCOUNTER — Other Ambulatory Visit: Payer: Self-pay | Admitting: Endocrinology

## 2019-02-17 DIAGNOSIS — E1065 Type 1 diabetes mellitus with hyperglycemia: Secondary | ICD-10-CM

## 2019-02-17 MED ORDER — SYNTHROID 125 MCG PO TABS: 125.0000 ug | ORAL_TABLET | Freq: Every day | ORAL | 2 refills | Status: DC

## 2019-02-17 NOTE — Telephone Encounter (Signed)
Name brand medication sent to pharmacy.

## 2019-02-20 ENCOUNTER — Encounter

## 2019-02-21 ENCOUNTER — Encounter (HOSPITAL_COMMUNITY): Payer: Self-pay

## 2019-02-21 ENCOUNTER — Emergency Department (HOSPITAL_BASED_OUTPATIENT_CLINIC_OR_DEPARTMENT_OTHER): Payer: Medicare Other

## 2019-02-21 ENCOUNTER — Emergency Department (HOSPITAL_COMMUNITY): Payer: Medicare Other

## 2019-02-21 ENCOUNTER — Other Ambulatory Visit: Payer: Self-pay

## 2019-02-21 ENCOUNTER — Emergency Department (HOSPITAL_COMMUNITY)
Admission: EM | Admit: 2019-02-21 | Discharge: 2019-02-22 | Disposition: A | Discharge status: Home / Self Care | Payer: Medicare Other | Attending: Emergency Medicine | Admitting: Emergency Medicine

## 2019-02-21 DIAGNOSIS — I82A12 Acute embolism and thrombosis of left axillary vein: Secondary | ICD-10-CM | POA: Diagnosis not present

## 2019-02-21 DIAGNOSIS — Z794 Long term (current) use of insulin: Secondary | ICD-10-CM | POA: Diagnosis not present

## 2019-02-21 DIAGNOSIS — M7989 Other specified soft tissue disorders: Secondary | ICD-10-CM | POA: Diagnosis not present

## 2019-02-21 DIAGNOSIS — I10 Essential (primary) hypertension: Secondary | ICD-10-CM | POA: Diagnosis not present

## 2019-02-21 DIAGNOSIS — R2232 Localized swelling, mass and lump, left upper limb: Secondary | ICD-10-CM | POA: Diagnosis present

## 2019-02-21 DIAGNOSIS — E109 Type 1 diabetes mellitus without complications: Secondary | ICD-10-CM | POA: Insufficient documentation

## 2019-02-21 DIAGNOSIS — Z79899 Other long term (current) drug therapy: Secondary | ICD-10-CM | POA: Insufficient documentation

## 2019-02-21 DIAGNOSIS — E039 Hypothyroidism, unspecified: Secondary | ICD-10-CM | POA: Insufficient documentation

## 2019-02-21 LAB — CBC WITH DIFFERENTIAL/PLATELET
Abs Immature Granulocytes: 0.07 10*3/uL (ref 0.00–0.07)
Basophils Absolute: 0 10*3/uL (ref 0.0–0.1)
Basophils Relative: 0 %
Eosinophils Absolute: 0 10*3/uL (ref 0.0–0.5)
Eosinophils Relative: 0 %
HCT: 47.3 % (ref 39.0–52.0)
Hemoglobin: 15.4 g/dL (ref 13.0–17.0)
Immature Granulocytes: 0 %
Lymphocytes Relative: 4 %
Lymphs Abs: 0.7 10*3/uL (ref 0.7–4.0)
MCH: 28.3 pg (ref 26.0–34.0)
MCHC: 32.6 g/dL (ref 30.0–36.0)
MCV: 86.8 fL (ref 80.0–100.0)
Monocytes Absolute: 0.9 10*3/uL (ref 0.1–1.0)
Monocytes Relative: 5 %
Neutro Abs: 15.3 10*3/uL — ABNORMAL HIGH (ref 1.7–7.7)
Neutrophils Relative %: 91 %
Platelets: 278 10*3/uL (ref 150–400)
RBC: 5.45 MIL/uL (ref 4.22–5.81)
RDW: 14.1 % (ref 11.5–15.5)
WBC: 16.9 10*3/uL — ABNORMAL HIGH (ref 4.0–10.5)
nRBC: 0 % (ref 0.0–0.2)

## 2019-02-21 LAB — BASIC METABOLIC PANEL
Anion gap: 9 (ref 5–15)
Anion gap: 11 (ref 5–15)
BUN: 21 mg/dL — ABNORMAL HIGH (ref 6–20)
BUN: 19 mg/dL (ref 6–20)
CO2: 25 mmol/L (ref 22–32)
CO2: 24 mmol/L (ref 22–32)
Calcium: 9.4 mg/dL (ref 8.9–10.3)
Calcium: 9.5 mg/dL (ref 8.9–10.3)
Chloride: 99 mmol/L (ref 98–111)
Chloride: 100 mmol/L (ref 98–111)
Creatinine, Ser: 1.21 mg/dL (ref 0.61–1.24)
Creatinine, Ser: 1.17 mg/dL (ref 0.61–1.24)
GFR calc Af Amer: >60 mL/min (ref >60)
GFR calc Af Amer: >60 mL/min (ref >60)
GFR calc non Af Amer: >60 mL/min (ref >60)
GFR calc non Af Amer: >60 mL/min (ref >60)
Glucose, Bld: 275 mg/dL — ABNORMAL HIGH (ref 70–99)
Glucose, Bld: 241 mg/dL — ABNORMAL HIGH (ref 70–99)
Potassium: 5.6 mmol/L — ABNORMAL HIGH (ref 3.5–5.1)
Potassium: 4.9 mmol/L (ref 3.5–5.1)
Sodium: 133 mmol/L — ABNORMAL LOW (ref 135–145)
Sodium: 135 mmol/L (ref 135–145)

## 2019-02-21 LAB — TROPONIN I (HIGH SENSITIVITY)
Troponin I (High Sensitivity): 3 ng/L (ref <18)
Troponin I (High Sensitivity): <2 ng/L (ref <18)

## 2019-02-21 MED ORDER — SODIUM CHLORIDE 0.9 % IV BOLUS
1000.0000 mL | Freq: Once | INTRAVENOUS | Status: AC
  Administered 2019-02-21: 1000 mL via INTRAVENOUS

## 2019-02-21 MED ORDER — IOHEXOL 350 MG/ML SOLN
100.0000 mL | Freq: Once | INTRAVENOUS | Status: AC | PRN
  Administered 2019-02-21: 100 mL via INTRAVENOUS

## 2019-02-21 MED ORDER — RIVAROXABAN 15 MG PO TABS
15.0000 mg | ORAL_TABLET | Freq: Once | ORAL | Status: AC
  Administered 2019-02-21: 15 mg via ORAL
  Filled 2019-02-21 (×2): qty 1

## 2019-02-21 MED ORDER — RIVAROXABAN (XARELTO) VTE STARTER PACK (15 & 20 MG): ORAL_TABLET | ORAL | 0 refills | Status: DC

## 2019-02-21 NOTE — ED Triage Notes (Signed)
Pt presents with Left arm pain x2 weeks, woke up this am with swelling to Left arm. Hx of MS and DM. PCP sent him here

## 2019-02-21 NOTE — Discharge Instructions (Addendum)
Please start Xarelto in the morning Make an appointment with your doctor for follow up Please return for worsening symptoms such as severe chest pain, shortness of breath, light headedness, passing out, blood in the stool or black stools Do not drink alcohol or taken NSAIDs while taking this medicine (Naproxen, Ibuprofen, Aspirin)

## 2019-02-21 NOTE — ED Provider Notes (Signed)
[facility] EMERGENCY DEPARTMENT Provider Note   CSN: [id number] Arrival date & time: 02/21/19  1738     History   Chief Complaint Chief Complaint  Patient presents with   Arm Swelling    HPI Johnathan Ross is a 48 y.o. male who presents with L arm swelling. PMH significant for insulin dependent DM, MS, HTN, HLD, thyroid d/o. He states that for the past several weeks he has had an inconsistent pain in his L shoulder area. It hurts when he reaches upward. This morning he woke up and his entire L arm was swollen and he noticed it was somewhat red. He has a device that checks his glucose over the posterior aspect of the arm but states this has not been bothering him. He denies any recent needle sticks. No trauma to the arm. No fevers, chest pain or SOB. No hx of DVT or PE. He called his doctor and was told to come to the ED.     HPI  Past Medical History:  Diagnosis Date   Hyperlipidemia    Hypertension    Hypogonadism in male 11/01/2016   Hypothyroidism    Multiple sclerosis ([place])    Proliferative diabetic retinopathy(362.02)    Type 1 diabetes mellitus ([place]) dx'd 1994   Vitamin D insufficiency 11/29/2016    Patient Active Problem List   Diagnosis Date Noted   Vitamin D insufficiency 11/29/2016   Hypogonadism in male 11/01/2016   Type 1 diabetes mellitus ([place])    Hypothyroidism    Hypertension    Hyperlipidemia    Closed fracture of right fibula and tibia 10/29/2016   Closed tibia fracture 10/29/2016   Closed fracture of right tibial plafond with fibula involvement 10/29/2016   Abnormal liver function tests 03/06/2013   RASH AND OTHER NONSPECIFIC SKIN ERUPTION 10/07/2009   SHOULDER PAIN, LEFT 12/27/2008   Type I (juvenile type) diabetes mellitus without mention of complication, uncontrolled 12/03/2007   HYPOTHYROIDISM 11/12/2007   HYPERCHOLESTEROLEMIA 11/12/2007   MULTIPLE SCLEROSIS 08/29/2007   PROLIFERATIVE DIABETIC  RETINOPATHY 08/29/2007   HYPERTENSION 12/25/2006    Past Surgical History:  Procedure Laterality Date   EYE SURGERY Bilateral    "laser for diabetic retinopathy"   FRACTURE SURGERY     OPEN REDUCTION INTERNAL FIXATION (ORIF) TIBIA/FIBULA FRACTURE Right 10/30/2016   ORIF ANKLE FRACTURE Left 2015   ORIF TIBIA FRACTURE Right 10/30/2016   Procedure: OPEN REDUCTION INTERNAL FIXATION (ORIF) TIBIA FIBULA FRACTURE;  Surgeon: Altamese Franquez, MD;  Location: [facility];  Service: Orthopedics;  Laterality: Right;   RETINAL LASER PROCEDURE Bilateral    "for diabetic retinopathy"        Home Medications    Prior to Admission medications   Medication Sig Start Date End Date Taking? Authorizing Provider  CONTOUR NEXT TEST test strip USE AS INSTRUCTED TO CHECK BLOOD SUGARS 8 TIMES DAILY 02/17/19   Elayne Snare, MD  dalfampridine (AMPYRA) 10 MG TB12 Take 10 mg by mouth 2 (two) times daily. Take one tablet (10 mg) by mouth every morning and mid afternoon     [provider]  Insulin Human (INSULIN PUMP) SOLN Inject into the skin continuous. Novolog    [provider]  insulin lispro (HUMALOG) 100 UNIT/ML injection Use max of 80 units daily via insulin pump. **replaces Novolog since Psychiatric nurse cover.** 02/13/19   Elayne Snare, MD  lisinopril (PRINIVIL,ZESTRIL) 10 MG tablet TAKE 1 TABLET BY MOUTH EVERY DAY 09/04/18   Elayne Snare, MD  simvastatin [facility])  20 MG tablet TAKE 1 TABLET BY MOUTH AT BEDTIME 02/26/18   Elayne Snare, MD  SYNTHROID 125 MCG tablet Take 1 tablet (125 mcg total) by mouth daily before breakfast. 02/17/19   Elayne Snare, MD    Family History Family History  Problem Relation Age of Onset   Diabetes Sister    Colon cancer Maternal Grandmother    Colon polyps Maternal Uncle     Social History Social History   Tobacco Use   Smoking status: Never Smoker   Smokeless tobacco: Never Used  Substance Use Topics   Alcohol use: No   Drug use: No     Allergies    Patient has no known allergies.   Review of Systems Review of Systems  Constitutional: Negative for appetite change and fever.  Respiratory: Negative for shortness of breath.   Cardiovascular: Negative for chest pain.  Musculoskeletal:       +L arm swelling  Skin: Positive for color change. Negative for wound.  Allergic/Immunologic: Positive for immunocompromised state.  All other systems reviewed and are negative.    Physical Exam Updated Vital Signs BP 126/68    Pulse (!) 119    Temp 98.7 F (37.1 C) (Oral)    Resp 18    Ht 6\' 1"  (1.854 m)    Wt 85.7 kg    SpO2 100%    BMI 24.94 kg/m   Physical Exam Vitals signs and nursing note reviewed.  Constitutional:      General: He is not in acute distress.    Appearance: Normal appearance. He is well-developed. He is not ill-appearing.  HENT:     Head: Normocephalic and atraumatic.  Eyes:     General: No scleral icterus.       Right eye: No discharge.        Left eye: No discharge.     Conjunctiva/sclera: Conjunctivae normal.     Pupils: Pupils are equal, round, and reactive to light.  Neck:     Musculoskeletal: Normal range of motion.  Cardiovascular:     Rate and Rhythm: Tachycardia present.  Pulmonary:     Effort: Pulmonary effort is normal. No respiratory distress.     Breath sounds: Normal breath sounds.  Abdominal:     General: There is no distension.     Palpations: Abdomen is soft.     Tenderness: There is no abdominal tenderness.  Musculoskeletal:     Comments: Diffuse swelling of the L arm. Mild erythema over the anterior arm. FROM of the shoulder and elbow with normal grip strength. 2+ radial pulse  Skin:    General: Skin is warm and dry.  Neurological:     Mental Status: He is alert and oriented to person, place, and time.  Psychiatric:        Behavior: Behavior normal.      ED Treatments / Results  Labs (all labs ordered are listed, but only abnormal results are displayed) Labs Reviewed  CBC WITH  DIFFERENTIAL/PLATELET - Abnormal; Notable for the following components:      Result Value   WBC 16.9 (*)    Neutro Abs 15.3 (*)    All other components within normal limits  BASIC METABOLIC PANEL - Abnormal; Notable for the following components:   Sodium 133 (*)    Potassium 5.6 (*)    Glucose, Bld 275 (*)    BUN 21 (*)    All other components within normal limits  BASIC METABOLIC PANEL - Abnormal; Notable for  the following components:   Glucose, Bld 241 (*)    All other components within normal limits  TROPONIN I (HIGH SENSITIVITY)  TROPONIN I (HIGH SENSITIVITY)    EKG EKG Interpretation  Date/Time:  Saturday February 21 2019 20:32:09 EDT Ventricular Rate:  110 PR Interval:  128 QRS Duration: 78 QT Interval:  308 QTC Calculation: 416 R Axis:   88 Text Interpretation:  Sinus tachycardia Right atrial enlargement Borderline ECG Since last tracing rate faster Confirmed by Dorie Rank [phone]) on 02/21/2019 8:37:30 PM   Radiology Ct Angio Chest Pe W/cm &/or Wo Cm  Result Date: 02/21/2019 CLINICAL DATA:  Left arm pain for 2 weeks, woke this morning with left arm swelling EXAM: CT ANGIOGRAPHY CHEST WITH CONTRAST TECHNIQUE: Multidetector CT imaging of the chest was performed using the standard protocol during bolus administration of intravenous contrast. Multiplanar CT image reconstructions and MIPs were obtained to evaluate the vascular anatomy. CONTRAST:  157mL OMNIPAQUE IOHEXOL 350 MG/ML SOLN COMPARISON:  Radiograph 07/28/2012 FINDINGS: Cardiovascular: Satisfactory opacification of the pulmonary arteries to the segmental level. No pulmonary arterial filling defects are identified. Central pulmonary arteries are normal caliber. No elevation of the RV/LV ratio. Normal heart size. No pericardial effusion. Atherosclerotic calcification of the coronary arteries. The aorta is normal caliber. Normal 3 vessel branching pattern of the aortic arch. Mediastinum/Nodes: No enlarged mediastinal or  axillary lymph nodes. Thyroid gland, trachea, and esophagus demonstrate no significant findings. Lungs/Pleura: Mild right apical pleuroparenchymal scarring, present on prior radiograph. No consolidation, features of edema, pneumothorax, or effusion. No suspicious pulmonary nodules or masses. Upper Abdomen: No acute abnormalities present in the visualized portions of the upper abdomen. Musculoskeletal: Mild edematous soft tissue changes noted in the included portion of the left upper extremity. Mild bilateral gynecomastia. No suspicious osseous lesions or other chest wall abnormality is seen. Minimal degenerative changes in the spine. Review of the MIP images confirms the above findings. IMPRESSION: No evidence of pulmonary embolism. No acute intrathoracic process. Edematous changes in the included soft tissues of left upper extremity. Mild bilateral gynecomastia. Aortic Atherosclerosis (ICD10-I70.0). Electronically Signed   By: Lovena Le M.D.   On: 02/21/2019 21:04   Ue Venous Duplex ([facility] Only)  Result Date: 02/22/2019 UPPER VENOUS STUDY  Indications: Edema Risk Factors: MS, Diabetes. Comparison Study: No prior study on file for comparison. Performing Technologist: Sharion Dove RVS  Examination Guidelines: A complete evaluation includes B-mode imaging, spectral Doppler, color Doppler, and power Doppler as needed of all accessible portions of each vessel. Bilateral testing is considered an integral part of a complete examination. Limited examinations for reoccurring indications may be performed as noted.  Right Findings: +----------+------------+---------+-----------+----------+-------+  RIGHT      Compressible Phasicity Spontaneous Properties Summary  +----------+------------+---------+-----------+----------+-------+  Subclavian                 Yes        Yes                         +----------+------------+---------+-----------+----------+-------+  Left Findings:  +----------+------------+---------+-----------+----------+-------+  LEFT       Compressible Phasicity Spontaneous Properties Summary  +----------+------------+---------+-----------+----------+-------+  IJV            Full        Yes        Yes                         +----------+------------+---------+-----------+----------+-------+  Subclavian  None                                       Acute   +----------+------------+---------+-----------+----------+-------+  Axillary       None                                       Acute   +----------+------------+---------+-----------+----------+-------+  Brachial       Full                                               +----------+------------+---------+-----------+----------+-------+  Radial         Full                                               +----------+------------+---------+-----------+----------+-------+  Ulnar          Full                                               +----------+------------+---------+-----------+----------+-------+  Cephalic       Full                                               +----------+------------+---------+-----------+----------+-------+  Basilic        Full                                               +----------+------------+---------+-----------+----------+-------+ Acute DVT noted in the left axillary beginning in the axilla and extending into the subclavian vein.  Summary:  Left: Findings consistent with acute deep vein thrombosis involving the left axillary vein and left subclavian vein. Acute DVT noted in the left axillary beginning in the axilla and extending into the subclavian vein.  *See table(s) above for measurements and observations.  Diagnosing physician: Curt Jews MD Electronically signed by Curt Jews MD on 02/22/2019 at 9:48:25 AM.    Final     Procedures Procedures (including critical care time)  Medications Ordered in ED Medications  sodium chloride 0.9 % bolus 1,000 mL (0 mLs Intravenous Stopped 02/21/19  2140)  iohexol (OMNIPAQUE) 350 MG/ML injection 100 mL (100 mLs Intravenous Contrast Given 02/21/19 2038)  Rivaroxaban (XARELTO) tablet 15 mg (15 mg Oral Given 02/21/19 2350)     Initial Impression / Assessment and Plan / ED Course  I have reviewed the triage vital signs and the nursing notes.  Pertinent labs & imaging results that were available during my care of the patient were reviewed by me and considered in my medical decision making (see chart for details).  48 year old male presents with acute L arm swelling. DVT study was performed and shows an acute DVT that extends from the  axillary vein to subclavian. He is mildly tachycardic and is having some atypical sounding rib pain therefore will order CTA of chest. His EKG shows sinus tachycardia. CBC shows leukocytosis of 16 but this seems to be a chronic issue. BMP shows mild hyponatremia and hyperkalemia (5.6). A repeat was obtained and is normal. 1st and 2nd trop are negative.    CTA is negative for PE. Shared visit with Dr. Sabra Heck. I discussed with Dr. Donnetta Hutching with vascular regarding management of this DVT - he recommends anticoagulation but they would not do any specific surgical intervention at this time. Discussed results with the patient. He was given a dose of Xarelto here and a starter pack. He was recommended to make a f/u appt with his PCP.  Final Clinical Impressions(s) / ED Diagnoses   Final diagnoses:  Acute deep vein thrombosis (DVT) of axillary vein of left upper extremity [facility])    ED Discharge Orders    None       Recardo Evangelist, PA-C 02/22/19 1516    Noemi Chapel, MD 02/22/19 [phone]

## 2019-02-21 NOTE — ED Provider Notes (Signed)
This patient is a 48 year old male with a known history of multiple sclerosis as well as a history of diabetes, high cholesterol and hypertension.  He reported starting to have some swelling of his left arm this morning.  On exam the patient does have significant swelling from his antecubital fossa up through the shoulder and minimal swelling distal to that.  Pulses are normal, sensation is normal, capillary refill is normal.  Doppler ultrasound shows that the patient does not fact have a proximal DVT located in the axillary and subclavian veins.  There is no signs of pulmonary embolism on CT angiogram.  I discussed all these results with the patient, we will consult with vascular surgery regarding possible outpatient follow-up though at this time it does not appear that he needs a definite thrombectomy.  We will start an anticoagulant, the patient has been informed of the risk benefits and alternatives and is agreeable to the plan.  Likely stable for discharge.  I do not see an obvious reason or nidus for this clot.  He does not recall any injuries, he does not have cancer, there is been no recent travel trauma immobilization or surgery.  Medical screening examination/treatment/procedure(s) were conducted as a shared visit with non-physician practitioner(s) and myself.  I personally evaluated the patient during the encounter.  Clinical Impression:   Final diagnoses:  Acute deep vein thrombosis (DVT) of axillary vein of left upper extremity (HCC)         Noemi Chapel, MD 02/22/19 [phone]

## 2019-02-21 NOTE — Progress Notes (Signed)
VASCULAR LAB PRELIMINARY  PRELIMINARY  PRELIMINARY  PRELIMINARY  Left upper extremity venous duplex completed.    Preliminary report:  See CV proc for preliminary results.   Raydin Bielinski, RVT 02/21/2019, 7:30 PM

## 2019-02-21 NOTE — ED Notes (Signed)
Patient transported to CT 

## 2019-02-23 ENCOUNTER — Telehealth: Payer: Self-pay | Admitting: Family

## 2019-02-23 ENCOUNTER — Other Ambulatory Visit: Payer: Self-pay | Admitting: Endocrinology

## 2019-02-23 MED ORDER — UNITHROID 125 MCG PO TABS: 125.0000 ug | ORAL_TABLET | Freq: Every day | ORAL | 3 refills | Status: DC

## 2019-02-23 NOTE — Telephone Encounter (Signed)
Patient spoke with [facility] on 02/21/19 at 5:06pm.  States that his left arm is swollen from shoulder to finger tips.  States he is unable to bend completely.  States symptoms began that morning. Patient was advised to go to the ED. Patient is scheduled for a Hosp[facility] FU on 9/30.

## 2019-02-24 ENCOUNTER — Telehealth: Payer: Self-pay | Admitting: Endocrinology

## 2019-02-24 NOTE — Telephone Encounter (Signed)
Patient states that Dr.Kumar changed his basel rates due to low readings at night and now his readings at night are high.  Please Advise, Thanks

## 2019-02-24 NOTE — Telephone Encounter (Signed)
Please discuss with Dr. Dwyane Dee

## 2019-02-24 NOTE — Telephone Encounter (Signed)
He can  upload his freestyle libre or give me detailed readings for 2-3 days

## 2019-02-24 NOTE — Telephone Encounter (Signed)
Please instruct him on temporary basal rate

## 2019-02-24 NOTE — Telephone Encounter (Signed)
I do not see any consistent pattern.  His blood sugar has been higher since last night likely to be from a high fat meal in the evening If he is not able to get his blood sugar down with extra boluses he can temporarily increase his basal rate by 25% for 4 hours. Also has he looked into the [employer] sensor?

## 2019-02-24 NOTE — Telephone Encounter (Signed)
Per [facility] "Caller states message for Dr. Ronnie Derby nurse; BS now staying over 250 after lows; needs advice on changing numbers. Caller declined triage". Patient's ph#  [phone]

## 2019-02-24 NOTE — Telephone Encounter (Signed)
Patient has appointment on tomorrow.

## 2019-02-24 NOTE — Telephone Encounter (Signed)
I called patient, but received no answer. I asked that he call us back so we could make sure he knows how to increase temp basal.

## 2019-02-24 NOTE — Telephone Encounter (Signed)
Please get details of blood sugar readings before and after dinner and fasting

## 2019-02-24 NOTE — Telephone Encounter (Signed)
I spoke with patient & he is uploading freestyle libre. I will download & print for review.

## 2019-02-25 ENCOUNTER — Encounter (HOSPITAL_COMMUNITY): Payer: Self-pay

## 2019-02-25 ENCOUNTER — Encounter: Payer: Self-pay | Admitting: Family

## 2019-02-25 ENCOUNTER — Other Ambulatory Visit: Payer: Self-pay | Admitting: Family

## 2019-02-25 ENCOUNTER — Ambulatory Visit (INDEPENDENT_AMBULATORY_CARE_PROVIDER_SITE_OTHER): Payer: Medicare Other | Admitting: Family

## 2019-02-25 ENCOUNTER — Ambulatory Visit (HOSPITAL_COMMUNITY)
Admission: RE | Admit: 2019-02-25 | Discharge: 2019-02-25 | Disposition: A | Discharge status: Home / Self Care | Payer: Medicare Other | Source: Ambulatory Visit | Attending: Cardiovascular Disease | Admitting: Cardiovascular Disease

## 2019-02-25 ENCOUNTER — Other Ambulatory Visit: Payer: Self-pay

## 2019-02-25 VITALS — BP 126/76 | HR 95 | Temp 98.4°F | Ht 73.0 in

## 2019-02-25 DIAGNOSIS — I82622 Acute embolism and thrombosis of deep veins of left upper extremity: Secondary | ICD-10-CM | POA: Diagnosis not present

## 2019-02-25 DIAGNOSIS — I82629 Acute embolism and thrombosis of deep veins of unspecified upper extremity: Secondary | ICD-10-CM

## 2019-02-25 MED ORDER — RIVAROXABAN 20 MG PO TABS: 20.0000 mg | ORAL_TABLET | Freq: Every day | ORAL | 2 refills | Status: DC

## 2019-02-25 NOTE — Progress Notes (Signed)
Johnathan Ross is a 48 y.o. male with the following history as recorded in [employer]:  Patient Active Problem List   Diagnosis Date Noted  . Vitamin D insufficiency 11/29/2016  . Hypogonadism in male 11/01/2016  . Type 1 diabetes mellitus ([place])   . Hypothyroidism   . Hypertension   . Hyperlipidemia   . Closed fracture of right fibula and tibia 10/29/2016  . Closed tibia fracture 10/29/2016  . Closed fracture of right tibial plafond with fibula involvement 10/29/2016  . Abnormal liver function tests 03/06/2013  . RASH AND OTHER NONSPECIFIC SKIN ERUPTION 10/07/2009  . SHOULDER PAIN, LEFT 12/27/2008  . Type I (juvenile type) diabetes mellitus without mention of complication, uncontrolled 12/03/2007  . HYPOTHYROIDISM 11/12/2007  . HYPERCHOLESTEROLEMIA 11/12/2007  . MULTIPLE SCLEROSIS 08/29/2007  . PROLIFERATIVE DIABETIC RETINOPATHY 08/29/2007  . HYPERTENSION 12/25/2006    Current Outpatient Medications  Medication Sig Dispense Refill  . CONTOUR NEXT TEST test strip USE AS INSTRUCTED TO CHECK BLOOD SUGARS 8 TIMES DAILY (Patient taking differently: 1 each by Other route See admin instructions. Test BGL 8 times daily) 300 strip 3  . dalfampridine (AMPYRA) 10 MG TB12 Take 10 mg by mouth See admin instructions. Take 10 mg by mouth in the morning and 10 mg mid-afternoon    . insulin lispro (HUMALOG) 100 UNIT/ML injection Use max of 80 units daily via insulin pump. **replaces Novolog since Psychiatric nurse cover.** (Patient taking differently: See admin instructions. Use max of 80 units daily via insulin pump. **replaces Novolog since insurance won't cover.**) 30 mL 3  . lisinopril (PRINIVIL,ZESTRIL) 10 MG tablet TAKE 1 TABLET BY MOUTH EVERY DAY (Patient taking differently: Take 10 mg by mouth daily. ) 90 tablet 0  . Rivaroxaban 15 & 20 MG TBPK Follow package directions: Take one 15mg  tablet by mouth twice a day. On day 22, switch to one 20mg  tablet once a day. Take with food. 51 each 0  .  simvastatin (ZOCOR) 20 MG tablet TAKE 1 TABLET BY MOUTH AT BEDTIME (Patient taking differently: Take 20 mg by mouth at bedtime. ) 90 tablet 1  . UNITHROID 125 MCG tablet Take 1 tablet (125 mcg total) by mouth daily before breakfast. 30 tablet 3  . rivaroxaban (XARELTO) 20 MG TABS tablet Take 1 tablet (20 mg total) by mouth daily with supper. 30 tablet 2   No current facility-administered medications for this visit.     Allergies: Patient has no known allergies.  Past Medical History:  Diagnosis Date  . Hyperlipidemia   . Hypertension   . Hypogonadism in male 11/01/2016  . Hypothyroidism   . Multiple sclerosis ([place])   . 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 Type 1 diabetes mellitus ([place]) dx'd 1994  . Vitamin D insufficiency 11/29/2016    Past Surgical History:  Procedure Laterality Date  . EYE SURGERY Bilateral    "laser for diabetic retinopathy"  . FRACTURE SURGERY    . OPEN REDUCTION INTERNAL FIXATION (ORIF) TIBIA/FIBULA FRACTURE Right 10/30/2016  . ORIF ANKLE FRACTURE Left 2015  . ORIF TIBIA FRACTURE Right 10/30/2016   Procedure: OPEN REDUCTION INTERNAL FIXATION (ORIF) TIBIA FIBULA FRACTURE;  Surgeon: Altamese Edgerton, MD;  Location: [facility];  Service: Orthopedics;  Laterality: Right;  . RETINAL LASER PROCEDURE Bilateral    "for diabetic retinopathy"    Family History  Problem Relation Age of Onset  . Diabetes Sister   . Colon cancer Maternal Grandmother   . Colon polyps Maternal Uncle     Social History  Tobacco Use  . Smoking status: Never Smoker  . Smokeless tobacco: Never Used  Substance Use Topics  . Alcohol use: No    Subjective:  Seen in ER on Saturday with swelling of left arm; diagnosed with DVT of upper arm; no PE noted; notes that pain and swelling of left upper arm are improved some but is concerned about amount of swelling noted in his hands; No prior history of DVT or PE; no recent surgery or travel that would provoke blood clot. Is currently taking  Xarelto;      Objective:  Vitals:   02/25/19 0942  BP: 126/76  Pulse: 95  Temp: 98.4 F (36.9 C)  TempSrc: Oral  SpO2: 97%  Height: 6\' 1"  (1.854 m)    General: Well developed, well nourished, in no acute distress  Skin : Warm and dry.  Head: Normocephalic and atraumatic  Eyes: Sclera and conjunctiva clear; pupils round and reactive to light; extraocular movements intact  Ears: External normal; canals clear; tympanic membranes normal  Oropharynx: Pink, supple. No suspicious lesions  Neck: Supple without thyromegaly, adenopathy  Lungs: Respirations unlabored;  CVS exam: normal rate and regular rhythm.  Abdomen: Soft; nontender; nondistended; normoactive bowel sounds; no masses or hepatosplenomegaly  Musculoskeletal: No deformities; no active joint inflammation  Extremities: Marked swelling of left upper extremity and left hands;  Vessels: Symmetric bilaterally  Neurologic: Alert and oriented; speech intact; face symmetrical; moves all extremities well; CNII-XII intact without focal deficit  Assessment:  1. Acute deep vein thrombosis (DVT) of upper extremity, unspecified laterality, unspecified vein (HCC)     Plan:  Will update vascular ultrasound today to ensure that clot has not changed in size; will refer to vascular specialist but will continue Xarelto for now; will plan to re-check ultrasound in 3 months to ensure reoslution of clot and will need to consider hematology consult at that time.   No follow-ups on file.  Orders Placed This Encounter  Procedures  . Ambulatory referral to Vascular Surgery    Referral Priority:   Urgent    Referral Type:   Surgical    Referral Reason:   Specialty Services Required    Requested Specialty:   Vascular Surgery    Number of Visits Requested:   1    Requested Prescriptions    No prescriptions requested or ordered in this encounter

## 2019-02-25 NOTE — Progress Notes (Unsigned)
Left upper venous duplex showed an acute DVT in the axillary and subclavian veins.  No changes from prior study done on 02/21/2019  Preliminary results can be found under CV proc through chart review.  Wilkie Aye RVT [facility]

## 2019-02-26 ENCOUNTER — Ambulatory Visit: Payer: Medicare Other | Admitting: Physical Therapy

## 2019-02-27 ENCOUNTER — Telehealth: Payer: Self-pay | Admitting: Physical Therapy

## 2019-02-27 ENCOUNTER — Ambulatory Visit: Payer: Medicare Other | Admitting: Physical Therapy

## 2019-02-27 NOTE — Telephone Encounter (Signed)
His arm is still quite swollen. I would expect it may be another 2 weeks before he is going to feel up to participating in outpatient PT.  I am fine with the aquatic therapy but since his neurologist ( [place] provider) is the ordering provider for the PT, I would recommend that you check with him for okay.

## 2019-02-27 NOTE — Telephone Encounter (Signed)
Hello [name], [name] has been participating in outpatient therapies.  We cancelled his therapies this week due to his UE DVT.  Please advise as to when it would be safe for Johnathan Ross to resume therapy and activity with his LUE.  Also, would he have clearance to participate in aquatic therapy in about 3-4 feet of water?  Thank you for your help, [name], PT, DPT 02/27/19    12:07 PM

## 2019-03-02 ENCOUNTER — Ambulatory Visit: Payer: Medicare Other | Admitting: Physical Therapy

## 2019-03-04 ENCOUNTER — Ambulatory Visit: Payer: Medicare Other | Admitting: Physical Therapy

## 2019-03-04 DIAGNOSIS — M62838 Other muscle spasm: Secondary | ICD-10-CM | POA: Diagnosis not present

## 2019-03-04 DIAGNOSIS — G35 Multiple sclerosis: Secondary | ICD-10-CM | POA: Diagnosis not present

## 2019-03-05 NOTE — Telephone Encounter (Signed)
Spoke with Tim from [employer]. He stated that when a pt gets a CGM from Medicare, it locks the pt into a 4 year contract with the DME to supply the reader.  Tim stated the patient can still switch, but the insurance will just not pay for the [facility] Reciever. They will pay for the Transmitter and the sensors.  Tim stated that if the pt states that the Receiver is not feasible for him to purchase, then to call him back and he would see if there is some assistance that he may be able to provide. Called pt and gave him this information.

## 2019-03-05 NOTE — Telephone Encounter (Signed)
Johnathan Ross stated that he can use the smart phone as the receiver, however, Medicare requires that the pt have the receiver and then they can opt to use their smart phone if they choose to. Johnathan Ross will be faxing some paperwork for [place] to fill out that would help the pt get the receiver in order to get him changed over from [place] to [place].

## 2019-03-05 NOTE — Telephone Encounter (Signed)
So I presume he cannot use his smart phone as a receiver?

## 2019-03-06 ENCOUNTER — Ambulatory Visit: Payer: Medicare Other | Admitting: Physical Therapy

## 2019-03-11 ENCOUNTER — Ambulatory Visit: Payer: Medicare Other | Admitting: Physical Therapy

## 2019-03-12 ENCOUNTER — Other Ambulatory Visit: Payer: Self-pay | Admitting: Endocrinology

## 2019-03-12 ENCOUNTER — Telehealth: Payer: Self-pay | Admitting: Orthopedic Surgery

## 2019-03-12 NOTE — Telephone Encounter (Signed)
-----   Message from Elayne Snare, MD sent at 03/12/2019  8:27 AM EDT ----- Regarding: Does not have appointment Please schedule follow-up, 30-minute visit and labs prior to visit

## 2019-03-12 NOTE — Telephone Encounter (Signed)
LMTCB and schedule lab and f/u appointment

## 2019-03-13 ENCOUNTER — Ambulatory Visit: Payer: Medicare Other | Admitting: Physical Therapy

## 2019-03-16 ENCOUNTER — Other Ambulatory Visit: Payer: Self-pay

## 2019-03-16 ENCOUNTER — Ambulatory Visit: Payer: Medicare Other | Attending: Urology | Admitting: Physical Therapy

## 2019-03-16 ENCOUNTER — Encounter: Payer: Self-pay | Admitting: Physical Therapy

## 2019-03-16 DIAGNOSIS — M21371 Foot drop, right foot: Secondary | ICD-10-CM | POA: Diagnosis not present

## 2019-03-16 DIAGNOSIS — R296 Repeated falls: Secondary | ICD-10-CM | POA: Diagnosis not present

## 2019-03-16 DIAGNOSIS — R2689 Other abnormalities of gait and mobility: Secondary | ICD-10-CM | POA: Insufficient documentation

## 2019-03-16 DIAGNOSIS — R262 Difficulty in walking, not elsewhere classified: Secondary | ICD-10-CM | POA: Diagnosis not present

## 2019-03-16 DIAGNOSIS — R2681 Unsteadiness on feet: Secondary | ICD-10-CM | POA: Diagnosis not present

## 2019-03-16 DIAGNOSIS — M21372 Foot drop, left foot: Secondary | ICD-10-CM | POA: Diagnosis not present

## 2019-03-16 DIAGNOSIS — M6281 Muscle weakness (generalized): Secondary | ICD-10-CM | POA: Diagnosis not present

## 2019-03-17 NOTE — Therapy (Signed)
[facility] [address] [place], [place], [address] Phone: [phone]   Fax:  [phone]  Physical Therapy Treatment  Patient Details  Name: Johnathan Ross MRN: [id number] Date of Birth: Nov 05, 1970 Referring Provider (PT): Carylon Perches, MD   Encounter Date: 03/16/2019  PT End of Session - 03/17/19 1439    Visit Number  4    Number of Visits  17    Date for PT Re-Evaluation  03/31/19    Authorization Type  Medicare Part A and B; 10th visit PN    PT Start Time  0845    PT Stop Time  0930    PT Time Calculation (min)  45 min    Activity Tolerance  Patient limited by fatigue    Behavior During Therapy  [facility] for tasks assessed/performed       Past Medical History:  Diagnosis Date  . Hyperlipidemia   . Hypertension   . Hypogonadism in male 11/01/2016  . Hypothyroidism   . Multiple sclerosis ([place])   . 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 Type 1 diabetes mellitus ([place]) dx'd 1994  . Vitamin D insufficiency 11/29/2016    Past Surgical History:  Procedure Laterality Date  . EYE SURGERY Bilateral    "laser for diabetic retinopathy"  . FRACTURE SURGERY    . OPEN REDUCTION INTERNAL FIXATION (ORIF) TIBIA/FIBULA FRACTURE Right 10/30/2016  . ORIF ANKLE FRACTURE Left 2015  . ORIF TIBIA FRACTURE Right 10/30/2016   Procedure: OPEN REDUCTION INTERNAL FIXATION (ORIF) TIBIA FIBULA FRACTURE;  Surgeon: Altamese Naguabo, MD;  Location: [facility];  Service: Orthopedics;  Laterality: Right;  . RETINAL LASER PROCEDURE Bilateral    "for diabetic retinopathy"    There were no vitals filed for this visit.  Subjective Assessment - 03/16/19 0852    Subjective  pt states he started taking Tizanidine medication for spasticity - says spasticity is worse in his legs; pt had DVT in LUE on 02-21-19 - this is his first day back to PT    Pertinent History  multiple falls, vitamin D insufficiency, Type 1 IDDM, diabetic retinopathy,  hypothyroidism, HTN, hyperlipidemia, R tibia fracture, L ankle fracture with ORIF    Limitations  Walking    Diagnostic tests  No new lesions seen on MRI    Patient Stated Goals  To improve walking    Currently in Pain?  No/denies                       [facility] Adult PT Treatment/Exercise - 03/17/19 0001      Transfers   Transfers  Sit to Stand    Sit to Stand  5: Supervision    Comments  UE support needed - pt needs to stand/stretch upon initial standing      Ambulation/Gait   Ambulation/Gait  Yes    Ambulation/Gait Assistance  5: Supervision    Ambulation/Gait Assistance Details  standing rest breaks with pt amb. from clinic lobby to back mat table in gym    Ambulation Distance (Feet)  100 Feet    Assistive device  Rolling walker    Gait Pattern  Step-to pattern;Decreased step length - right;Decreased stride length;Decreased hip/knee flexion - right;Decreased dorsiflexion - right;Right hip hike;Right genu recurvatum;Poor foot clearance - right    Ambulation Surface  Level;Indoor      Exercises   Exercises  Knee/Hip      Knee/Hip Exercises: Aerobic   Recumbent Bike  SciFit level 2.0 x  5" with UE's and LE's      Knee/Hip Exercises: Supine   Heel Slides  AAROM;Both;1 set;10 reps   resistance given with hip extension   Bridges  AROM;Both;1 set;10 reps    Other Supine Knee/Hip Exercises  ; hip abdct./adduction 10 reps with manual moderate resistance      NeuroRe-ed:  Pt performed tall kneeling on mat with use of Kay bench in front for UE support - squats in tall kneeling 10 reps - sitting Back toward heels slowly for stretching and returning to upright position with minimal UE support for balance - with CGA for balance  Tall kneeling - alternate UE flexion 5 reps each for core stabilization; pt performed moving each leg forward/back and out/in 5 reps  each leg each direction Trunk rotation 5 reps to right and left sides with CGA for balance  recovery        PT Short Term Goals - 03/17/19 1441      PT SHORT TERM GOAL #1   Title  Patient will demonstrate independence with updated HEP    Time  4    Period  Weeks    Status  New    Target Date  03/20/19   target date extended due to pt on hold due to DVT in LUE     PT SHORT TERM GOAL #2   Title  Pt will improve gait velocity with RW to >/= 1.0 ft/sec with decreased use of UE to lift and swing RLE through    Baseline  .25 ft/sec with RW/AFO    Time  4    Period  Weeks    Status  New    Target Date  03/20/19      PT SHORT TERM GOAL #3   Title  Pt will improve BERG to >/= 33/56    Baseline  29/56    Time  4    Period  Weeks    Status  New    Target Date  03/20/19      PT SHORT TERM GOAL #4   Title  Pt will ambulate x 230' over indoor surfaces with RW MOD I with improved R hip and knee flexion and foot clearance without lifting through UE    Time  4    Period  Weeks    Status  New    Target Date  03/20/19      PT SHORT TERM GOAL #5   Title  Pt will negotate 8 stairs with two rails step to sequence with supervision    Time  4    Period  Weeks    Status  New    Target Date  03/20/19        PT Long Term Goals - 03/17/19 1441      PT LONG TERM GOAL #1   Title  Pt will demonstrate independence with land and aquatic HEP and will report going to [facility] 1-2x/week for ongoing fitness    Time  8    Period  Weeks    Status  New      PT LONG TERM GOAL #2   Title  Pt will improve gait velocity with RW to >/= 1.8 ft/sec    Time  8    Period  Weeks    Status  New      PT LONG TERM GOAL #3   Title  Pt will improve BERG to >/= 37/56 to decrease falls risk    Time  8    Period  Weeks    Status  New      PT LONG TERM GOAL #4   Title  Pt will ambulate x 200' outside over pavement and grass with supervision and LRAD to improve safety ambulating to wood working shop at home    Time  8    Period  Weeks    Status  New      PT LONG TERM GOAL #5   Title  Pt will  negotiate 8 stairs with 2 rails with alternating sequence with supervision    Time  8    Period  Weeks    Status  New            Plan - 03/16/19 [id number]    Clinical Impression Statement  Pt's LUE swollen due to DVT but pt had no c/o pain with weight bearing through UE's on kay bench for tall kneeling activities.  Pt has significant extensor spasticity with RLE > LLE with difficulty initiating knee flexion.  Pt demonstrating vaulting gait pattern for clearance of LE's due to extensor tone and spasticity.  Pt to call PCP for permission to start aquatic therapy.    Personal Factors and Comorbidities  Comorbidity 3+;Past/Current Experience    Comorbidities  multiple falls, vitamin D insufficiency, Type 1 IDDM, diabetic retinopathy, hypothyroidism, HTN, hyperlipidemia, R tibia fracture, L ankle fracture with ORIF    Examination-Activity Limitations  Carry;Locomotion Level;Stairs;Stand;Transfers;Squat    Examination-Participation Restrictions  Community Activity;Driving;Yard Work    [employer]chant [employer] officer  Evolving/Moderate complexity    Rehab Potential  Good    PT Frequency  2x / week    PT Duration  8 weeks    PT Treatment/Interventions  ADLs/Self Care Home Management;Aquatic Therapy;Electrical Stimulation;DME Instruction;Gait training;Stair training;Functional mobility training;Therapeutic activities;Therapeutic exercise;Balance training;Neuromuscular re-education;Patient/family education;Orthotic Fit/Training;Passive range of motion    PT Next Visit Plan  Audra, he inquires about use of Bioness - I deferred this ? to you - [facility] - [place]?  Continue to focus on core/postural control training without UE support, hip flexion and knee flexion strengthening, ankle DF strength.  SLS and weight shift training (rockerboard).    Consulted and Agree with Plan of Care  Patient       Patient will benefit from skilled therapeutic intervention in order to improve the following deficits and  impairments:  Abnormal gait, Decreased balance, Decreased endurance, Decreased coordination, Decreased range of motion, Decreased strength, Difficulty walking, Impaired tone, Postural dysfunction  Visit Diagnosis: Other abnormalities of gait and mobility  Muscle weakness (generalized)     Problem List Patient Active Problem List   Diagnosis Date Noted  . Vitamin D insufficiency 11/29/2016  . Hypogonadism in male 11/01/2016  . Type 1 diabetes mellitus ([place])   . Hypothyroidism   . Hypertension   . Hyperlipidemia   . Closed fracture of right fibula and tibia 10/29/2016  . Closed tibia fracture 10/29/2016  . Closed fracture of right tibial plafond with fibula involvement 10/29/2016  . Abnormal liver function tests 03/06/2013  . RASH AND OTHER NONSPECIFIC SKIN ERUPTION 10/07/2009  . SHOULDER PAIN, LEFT 12/27/2008  . Type I (juvenile type) diabetes mellitus without mention of complication, uncontrolled 12/03/2007  . HYPOTHYROIDISM 11/12/2007  . HYPERCHOLESTEROLEMIA 11/12/2007  . MULTIPLE SCLEROSIS 08/29/2007  . PROLIFERATIVE DIABETIC RETINOPATHY 08/29/2007  . HYPERTENSION 12/25/2006    Alda Lea, PT 03/17/2019, 4:28 PM  [facility] [address] [place], [place], [address] Phone: [phone]  Fax:  [phone]  Name: Johnathan Ross MRN: [id number] Date of Birth: 1971/04/10

## 2019-03-18 ENCOUNTER — Ambulatory Visit: Payer: Medicare Other | Admitting: Physical Therapy

## 2019-03-18 ENCOUNTER — Other Ambulatory Visit: Payer: Self-pay

## 2019-03-18 ENCOUNTER — Encounter: Payer: Self-pay | Admitting: Physical Therapy

## 2019-03-18 DIAGNOSIS — M6281 Muscle weakness (generalized): Secondary | ICD-10-CM | POA: Diagnosis not present

## 2019-03-18 DIAGNOSIS — M21371 Foot drop, right foot: Secondary | ICD-10-CM

## 2019-03-18 DIAGNOSIS — R296 Repeated falls: Secondary | ICD-10-CM | POA: Diagnosis not present

## 2019-03-18 DIAGNOSIS — R2689 Other abnormalities of gait and mobility: Secondary | ICD-10-CM

## 2019-03-18 DIAGNOSIS — M21372 Foot drop, left foot: Secondary | ICD-10-CM

## 2019-03-18 DIAGNOSIS — R262 Difficulty in walking, not elsewhere classified: Secondary | ICD-10-CM

## 2019-03-18 DIAGNOSIS — R2681 Unsteadiness on feet: Secondary | ICD-10-CM

## 2019-03-18 NOTE — Therapy (Signed)
[facility] [address] [place], [place], [address] Phone: [phone]   Fax:  [phone]  Physical Therapy Treatment  Patient Details  Name: Johnathan Ross MRN: [id number] Date of Birth: Oct 04, 1970 Referring Provider (PT): Carylon Perches, MD   Encounter Date: 03/18/2019  PT End of Session - 03/18/19 1006    Visit Number  5    Number of Visits  17    Date for PT Re-Evaluation  03/31/19    Authorization Type  Medicare Part A and B; 10th visit PN    PT Start Time  0846    PT Stop Time  0932    PT Time Calculation (min)  46 min    Activity Tolerance  Patient limited by fatigue    Behavior During Therapy  [facility] for tasks assessed/performed       Past Medical History:  Diagnosis Date  . Hyperlipidemia   . Hypertension   . Hypogonadism in male 11/01/2016  . Hypothyroidism   . Multiple sclerosis ([place])   . 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 Type 1 diabetes mellitus ([place]) dx'd 1994  . Vitamin D insufficiency 11/29/2016    Past Surgical History:  Procedure Laterality Date  . EYE SURGERY Bilateral    "laser for diabetic retinopathy"  . FRACTURE SURGERY    . OPEN REDUCTION INTERNAL FIXATION (ORIF) TIBIA/FIBULA FRACTURE Right 10/30/2016  . ORIF ANKLE FRACTURE Left 2015  . ORIF TIBIA FRACTURE Right 10/30/2016   Procedure: OPEN REDUCTION INTERNAL FIXATION (ORIF) TIBIA FIBULA FRACTURE;  Surgeon: Altamese Hermitage, MD;  Location: [facility];  Service: Orthopedics;  Laterality: Right;  . RETINAL LASER PROCEDURE Bilateral    "for diabetic retinopathy"    There were no vitals filed for this visit.  Subjective Assessment - 03/18/19 0856    Subjective  Has contacted PCP about starting aquatic earlier than Nov.  Still having a little pain in LUE but edema has improved.  Has lots of questions about Botox injections.  Feels less sleepy now that he is on Synthroid.    Pertinent History  multiple falls, vitamin D  insufficiency, Type 1 IDDM, diabetic retinopathy, hypothyroidism, HTN, hyperlipidemia, R tibia fracture, L ankle fracture with ORIF    Limitations  Walking    Diagnostic tests  No new lesions seen on MRI    Patient Stated Goals  To improve walking    Currently in Pain?  No/denies                       [facility] Adult PT Treatment/Exercise - 03/18/19 0921      Ambulation/Gait   Ambulation/Gait  Yes    Ambulation/Gait Assistance  4: Min assist    Ambulation/Gait Assistance Details  While ambulating therapist provided facilitation at pelvis for carryover over of full anterior and lateral weight shifting to allow heel lift and knee flexion of contralateral LE at terminal swing to initiate increased hip and knee flexion and LE clearance during swing phase.  Pt demonstrated decreased vaulting and use of UE but continues to have decreased toe clearance on R.    Ambulation Distance (Feet)  100 Feet    Assistive device  Rolling walker    Gait Pattern  Step-through pattern;Decreased step length - right;Decreased stride length;Decreased hip/knee flexion - right;Decreased dorsiflexion - right;Decreased weight shift to right;Decreased weight shift to left;Right genu recurvatum;Left genu recurvatum;Narrow base of support;Poor foot clearance - right    Ambulation Surface  Level;Indoor  Therapeutic Activites    Therapeutic Activities  Other Therapeutic Activities    Other Therapeutic Activities  continued to educate pt on Botox injections vs. baclofen pump; titration of Botox injections to avoid overdosing and purpose of reducing spasticity to improve active use of opposing muscles to improve mobility.  Educated on duration of effects of injection.      Neuro Re-ed    Neuro Re-ed Details   NMR in tall kneeling and then one set of half kneeling to focus on hamstring activation in non-synergistic patterns.  In tall kneeling performed tall kneeling squats in midline and then with R and L bias  with UE support on ball performing roll outs x 10 reps each direction; lateral squats to focus on glute med strengthening.  Progressed to perform 5 reps each but with UE lifting ball off ground when coming back upright to decrease support on UE and increased activation in trunk and LE.  Transitioned to half kneeling with L foot and then R foot forwards with one UE support on mat, one UE support on unstable ball and performed 10 reps weight shifting forwards and back; required total assistance to weight shift and bring each LE forwards and place in most stable position.  In tall kneeling performed anterior/posterior leans hinging at knees and not at hips for eccentric and concentric hamstring activation with UE support on ball x 10 reps          Balance Exercises - 03/18/19 1007      Balance Exercises: Standing   Rockerboard  Anterior/posterior;EO;10 reps;UE support   feet staggered for diagonal weight shifting       PT Education - 03/18/19 1006    Education Details  see TA    Person(s) Educated  Patient    Methods  Explanation    Comprehension  Verbalized understanding       PT Short Term Goals - 03/18/19 1015      PT SHORT TERM GOAL #1   Title  Patient will demonstrate independence with updated HEP    Time  4    Period  Weeks    Status  Deferred    Target Date  03/20/19   target date extended due to pt on hold due to DVT in LUE     PT SHORT TERM GOAL #2   Title  Pt will improve gait velocity with RW to >/= 1.0 ft/sec with decreased use of UE to lift and swing RLE through    Baseline  .60 ft/sec with RW/AFO    Time  4    Period  Weeks    Status  Deferred    Target Date  03/20/19      PT SHORT TERM GOAL #3   Title  Pt will improve BERG to >/= 33/56    Baseline  29/56    Time  4    Period  Weeks    Status  Deferred    Target Date  03/20/19      PT SHORT TERM GOAL #4   Title  Pt will ambulate x 230' over indoor surfaces with RW MOD I with improved R hip and knee  flexion and foot clearance without lifting through UE    Time  4    Period  Weeks    Status  Deferred    Target Date  03/20/19      PT SHORT TERM GOAL #5   Title  Pt will negotate 8 stairs with two rails step  to sequence with supervision    Time  4    Period  Weeks    Status  Deferred    Target Date  03/20/19        PT Long Term Goals - 03/17/19 1441      PT LONG TERM GOAL #1   Title  Pt will demonstrate independence with land and aquatic HEP and will report going to [facility] 1-2x/week for ongoing fitness    Time  8    Period  Weeks    Status  New      PT LONG TERM GOAL #2   Title  Pt will improve gait velocity with RW to >/= 1.8 ft/sec    Time  8    Period  Weeks    Status  New      PT LONG TERM GOAL #3   Title  Pt will improve BERG to >/= 37/56 to decrease falls risk    Time  8    Period  Weeks    Status  New      PT LONG TERM GOAL #4   Title  Pt will ambulate x 200' outside over pavement and grass with supervision and LRAD to improve safety ambulating to wood working shop at home    Time  8    Period  Weeks    Status  New      PT LONG TERM GOAL #5   Title  Pt will negotiate 8 stairs with 2 rails with alternating sequence with supervision    Time  8    Period  Weeks    Status  New            Plan - 03/18/19 1007    Clinical Impression Statement  Continued NMR from previous session with tall and half kneeling but progressed to UE support on unstable therapy ball.  Continues to demonstrate significant weakness and spasticity limiting patient's ability to advance each LE.  Returned to use of rockerboard for weight shift training and then attempted to facilitate carry over to gait.  Pt demonstrated improved gait with facilitation but continues to demonstrate increased toe drag on RLE.  Will continue to address in order to progress towards LTG.  Deferred assessment of STG for now due to pt missing multiple weeks due to DVT.    Personal Factors and Comorbidities   Comorbidity 3+;Past/Current Experience    Comorbidities  multiple falls, vitamin D insufficiency, Type 1 IDDM, diabetic retinopathy, hypothyroidism, HTN, hyperlipidemia, R tibia fracture, L ankle fracture with ORIF    Examination-Activity Limitations  Carry;Locomotion Level;Stairs;Stand;Transfers;Squat    Examination-Participation Restrictions  Community Activity;Driving;Yard Work    [employer]chant [employer] officer  Evolving/Moderate complexity    Rehab Potential  Good    PT Frequency  2x / week    PT Duration  8 weeks    PT Treatment/Interventions  ADLs/Self Care Home Management;Aquatic Therapy;Electrical Stimulation;DME Instruction;Gait training;Stair training;Functional mobility training;Therapeutic activities;Therapeutic exercise;Balance training;Neuromuscular re-education;Patient/family education;Orthotic Fit/Training;Passive range of motion    PT Next Visit Plan  Kathy-could you set him back up on Bioness on RLE - hamstring and ankle DF?  Deferred assessment of STG due to pt being on medical hold.  Scheduled more land appointments but needs to schedule aquatic - will need to cancel a land appointment once he is scheduled for aquatic.  [facility] on [employer].  Continue to focus on core/postural control training without UE support, hip flexion and knee flexion strengthening, ankle DF strength - sitting on  therapy ball.  SLS and weight shift training (rockerboard).    Consulted and Agree with Plan of Care  Patient       Patient will benefit from skilled therapeutic intervention in order to improve the following deficits and impairments:  Abnormal gait, Decreased balance, Decreased endurance, Decreased coordination, Decreased range of motion, Decreased strength, Difficulty walking, Impaired tone, Postural dysfunction  Visit Diagnosis: Other abnormalities of gait and mobility  Muscle weakness (generalized)  Repeated falls  Foot drop, right  Unsteadiness on feet  Foot drop,  left  Difficulty in walking, not elsewhere classified     Problem List Patient Active Problem List   Diagnosis Date Noted  . Vitamin D insufficiency 11/29/2016  . Hypogonadism in male 11/01/2016  . Type 1 diabetes mellitus ([place])   . Hypothyroidism   . Hypertension   . Hyperlipidemia   . Closed fracture of right fibula and tibia 10/29/2016  . Closed tibia fracture 10/29/2016  . Closed fracture of right tibial plafond with fibula involvement 10/29/2016  . Abnormal liver function tests 03/06/2013  . RASH AND OTHER NONSPECIFIC SKIN ERUPTION 10/07/2009  . SHOULDER PAIN, LEFT 12/27/2008  . Type I (juvenile type) diabetes mellitus without mention of complication, uncontrolled 12/03/2007  . HYPOTHYROIDISM 11/12/2007  . HYPERCHOLESTEROLEMIA 11/12/2007  . MULTIPLE SCLEROSIS 08/29/2007  . PROLIFERATIVE DIABETIC RETINOPATHY 08/29/2007  . HYPERTENSION 12/25/2006    [name], PT, DPT 03/18/19    10:17 AM    [facility] [address] [place], [place], [address] Phone: [phone]   Fax:  [phone]  Name: ZAKARIYE PERSAD MRN: [id number] Date of Birth: [birth year]

## 2019-03-20 ENCOUNTER — Telehealth: Payer: Self-pay

## 2019-03-20 ENCOUNTER — Other Ambulatory Visit: Payer: Self-pay | Admitting: Endocrinology

## 2019-03-20 NOTE — Telephone Encounter (Signed)
Called and left message for patient to return call to clinic. 

## 2019-03-20 NOTE — Telephone Encounter (Signed)
Copied from [place] [id number]. Topic: General - Inquiry >> Mar 20, 2019 10:23 AM Richardo Priest, NT wrote: Reason for CRM: Patient called in stating he would like to discuss some things with PCP's CMA in regards to previous blood clot he used to have. Please advise.

## 2019-03-23 ENCOUNTER — Ambulatory Visit: Payer: Medicare Other | Admitting: Physical Therapy

## 2019-03-23 ENCOUNTER — Other Ambulatory Visit: Payer: Self-pay

## 2019-03-23 DIAGNOSIS — R2689 Other abnormalities of gait and mobility: Secondary | ICD-10-CM | POA: Diagnosis not present

## 2019-03-23 DIAGNOSIS — R296 Repeated falls: Secondary | ICD-10-CM | POA: Diagnosis not present

## 2019-03-23 DIAGNOSIS — R2681 Unsteadiness on feet: Secondary | ICD-10-CM | POA: Diagnosis not present

## 2019-03-23 DIAGNOSIS — M6281 Muscle weakness (generalized): Secondary | ICD-10-CM

## 2019-03-23 DIAGNOSIS — M21371 Foot drop, right foot: Secondary | ICD-10-CM | POA: Diagnosis not present

## 2019-03-23 DIAGNOSIS — M21372 Foot drop, left foot: Secondary | ICD-10-CM | POA: Diagnosis not present

## 2019-03-24 ENCOUNTER — Telehealth: Payer: Self-pay

## 2019-03-24 ENCOUNTER — Encounter: Payer: Self-pay | Admitting: Physical Therapy

## 2019-03-24 NOTE — Therapy (Signed)
[facility] [address] [place], [place], [address] Phone: [phone]   Fax:  [phone]  Physical Therapy Treatment  Patient Details  Name: Johnathan Ross MRN: [id number] Date of Birth: 1970-06-17 Referring Provider (PT): Carylon Perches, MD   Encounter Date: 03/23/2019  PT End of Session - 03/24/19 0808    Visit Number  6    Number of Visits  17    Date for PT Re-Evaluation  03/31/19    Authorization Type  Medicare Part A and B; 10th visit PN    PT Start Time  0845    PT Stop Time  0935    PT Time Calculation (min)  50 min    Activity Tolerance  Patient limited by fatigue    Behavior During Therapy  [facility] for tasks assessed/performed       Past Medical History:  Diagnosis Date  . Hyperlipidemia   . Hypertension   . Hypogonadism in male 11/01/2016  . Hypothyroidism   . Multiple sclerosis ([place])   . 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 Type 1 diabetes mellitus ([place]) dx'd 1994  . Vitamin D insufficiency 11/29/2016    Past Surgical History:  Procedure Laterality Date  . EYE SURGERY Bilateral    "laser for diabetic retinopathy"  . FRACTURE SURGERY    . OPEN REDUCTION INTERNAL FIXATION (ORIF) TIBIA/FIBULA FRACTURE Right 10/30/2016  . ORIF ANKLE FRACTURE Left 2015  . ORIF TIBIA FRACTURE Right 10/30/2016   Procedure: OPEN REDUCTION INTERNAL FIXATION (ORIF) TIBIA FIBULA FRACTURE;  Surgeon: Altamese [place], MD;  Location: [facility];  Service: Orthopedics;  Laterality: Right;  . RETINAL LASER PROCEDURE Bilateral    "for diabetic retinopathy"    There were no vitals filed for this visit.  Subjective Assessment - 03/24/19 0800    Subjective  Pt reports he has not heard back from MD regarding permission to start aquatic therapy before early November;  pt states he feels his left knee is bending a little more    Pertinent History  multiple falls, vitamin D insufficiency, Type 1 IDDM, diabetic retinopathy,  hypothyroidism, HTN, hyperlipidemia, R tibia fracture, L ankle fracture with ORIF    Limitations  Walking    Diagnostic tests  No new lesions seen on MRI    Patient Stated Goals  To improve walking    Currently in Pain?  No/denies                       [facility] Adult PT Treatment/Exercise - 03/24/19 0001      Transfers   Transfers  Sit to Stand    Sit to Stand  6: Modified independent (Device/Increase time)    Comments  UE support needed - pt needs to stand/stretch upon initial standing      Ambulation/Gait   Ambulation/Gait  Yes    Ambulation/Gait Assistance  5: Supervision    Ambulation/Gait Assistance Details  no standing rest break needed with amb. from lobby to clinic gym    Ambulation Distance (Feet)  100 Feet    Assistive device  Rolling walker    Gait Pattern  Step-to pattern;Decreased step length - right;Decreased stride length;Decreased hip/knee flexion - right;Decreased dorsiflexion - right;Right hip hike;Right genu recurvatum;Poor foot clearance - right    Ambulation Surface  Level;Indoor      Knee/Hip Exercises: Aerobic   Recumbent Bike  SciFit level 1.5 x 5" with UE's and LE's   no charge as unsupervised  Knee/Hip Exercises: Supine   Heel Slides  AAROM;Both;1 set;10 reps   resistance given with hip extension   Bridges  AROM;Both;1 set;10 reps    Bridges with Clamshell  AROM;Both;1 set;10 reps    Other Supine Knee/Hip Exercises  hip abduction/adduction in hooklying with manual moderate resistance 10 reps    Other Supine Knee/Hip Exercises  hip flexion/extension in hooklying position 10 reps each leg      Knee/Hip Exercises: Sidelying   Other Sidelying Knee/Hip Exercises  hip flexion/extension 10 reps each leg; knee flexion/extension each leg with no assistance needed for this exercise      Knee/Hip Exercises: Prone   Hamstring Curl  1 set;10 reps   AAROM bil. LE's for full ROM   Hip Extension  AAROM;Both;1 set;10 reps   fknee flexed at 90 degrees      Neuro Re-ed:  Tall kneeling on floor with green physioball for UE support prn; pt performed lateral weight shifts with minimal UE support; squats back onto heels 5 reps, then 5 reps diagonal squats to each heel for hip extensor and oblique strengthening  Pt performed amb. Forwards/backwards 2 reps on red mat on floor for hip strengthening, trunk control and balance - CGA with UE support prn  1/2 kneeling - pt needed mod assist to transition from tall kneeling to 1/2 kneeling due to difficulty lifting each leg to 1/2 kneeling position; pt needed UE support to maintain balance in 1/2 kneeling position       PT Short Term Goals - 03/24/19 [id number]      PT SHORT TERM GOAL #1   Title  Patient will demonstrate independence with updated HEP    Time  4    Period  Weeks    Status  Deferred    Target Date  03/20/19   target date extended due to pt on hold due to DVT in LUE     PT SHORT TERM GOAL #2   Title  Pt will improve gait velocity with RW to >/= 1.0 ft/sec with decreased use of UE to lift and swing RLE through    Baseline  .77 ft/sec with RW/AFO    Time  4    Period  Weeks    Status  Deferred    Target Date  03/20/19      PT SHORT TERM GOAL #3   Title  Pt will improve BERG to >/= 33/56    Baseline  29/56    Time  4    Period  Weeks    Status  Deferred    Target Date  03/20/19      PT SHORT TERM GOAL #4   Title  Pt will ambulate x 230' over indoor surfaces with RW MOD I with improved R hip and knee flexion and foot clearance without lifting through UE    Time  4    Period  Weeks    Status  Deferred    Target Date  03/20/19      PT SHORT TERM GOAL #5   Title  Pt will negotate 8 stairs with two rails step to sequence with supervision    Time  4    Period  Weeks    Status  Deferred    Target Date  03/20/19        PT Long Term Goals - 03/24/19 [id number]      PT LONG TERM GOAL #1   Title  Pt will demonstrate independence with land and aquatic HEP  and will report going  to [facility] 1-2x/week for ongoing fitness    Time  8    Period  Weeks    Status  New      PT LONG TERM GOAL #2   Title  Pt will improve gait velocity with RW to >/= 1.8 ft/sec    Time  8    Period  Weeks    Status  New      PT LONG TERM GOAL #3   Title  Pt will improve BERG to >/= 37/56 to decrease falls risk    Time  8    Period  Weeks    Status  New      PT LONG TERM GOAL #4   Title  Pt will ambulate x 200' outside over pavement and grass with supervision and LRAD to improve safety ambulating to wood working shop at home    Time  8    Period  Weeks    Status  New      PT LONG TERM GOAL #5   Title  Pt will negotiate 8 stairs with 2 rails with alternating sequence with supervision    Time  8    Period  Weeks    Status  New            Plan - 03/24/19 [id number]    Clinical Impression Statement  Pt noted to have slightly increased Lt knee flexion in swing phase of gait at beginning of PT session, prior to fatigue.  Some slightly less vaulting with less UE weight bearing noted for clearance of RLE in swing phase of gait.  Pt continues to have spasticity in bil. LE's, with RLE >LLE which impacts, strength, AROM and mobility.  Pt needed intermittent min assist to maintain balance in tall kneeling due to decreased trunk control/core stabilization.    Personal Factors and Comorbidities  Comorbidity 3+;Past/Current Experience    Comorbidities  multiple falls, vitamin D insufficiency, Type 1 IDDM, diabetic retinopathy, hypothyroidism, HTN, hyperlipidemia, R tibia fracture, L ankle fracture with ORIF    Examination-Activity Limitations  Carry;Locomotion Level;Stairs;Stand;Transfers;Squat    Examination-Participation Restrictions  Community Activity;Driving;Yard Work    Merchant [employer] officer  Evolving/Moderate complexity    Rehab Potential  Good    PT Frequency  2x / week    PT Duration  8 weeks    PT Treatment/Interventions  ADLs/Self Care Home Management;Aquatic  Therapy;Electrical Stimulation;DME Instruction;Gait training;Stair training;Functional mobility training;Therapeutic activities;Therapeutic exercise;Balance training;Neuromuscular re-education;Patient/family education;Orthotic Fit/Training;Passive range of motion    PT Next Visit Plan  Kathy-could you set him back up on Bioness on RLE - hamstring and ankle DF?  Deferred assessment of STG due to pt being on medical hold.  Scheduled more land appointments but needs to schedule aquatic - will need to cancel a land appointment once he is scheduled for aquatic.  Endurance on [employer].  Continue to focus on core/postural control training without UE support, hip flexion and knee flexion strengthening, ankle DF strength - sitting on therapy ball.  SLS and weight shift training (rockerboard).    Consulted and Agree with Plan of Care  Patient       Patient will benefit from skilled therapeutic intervention in order to improve the following deficits and impairments:  Abnormal gait, Decreased balance, Decreased endurance, Decreased coordination, Decreased range of motion, Decreased strength, Difficulty walking, Impaired tone, Postural dysfunction  Visit Diagnosis: Other abnormalities of gait and mobility  Muscle weakness (generalized)  Unsteadiness on feet  Problem List Patient Active Problem List   Diagnosis Date Noted  . Vitamin D insufficiency 11/29/2016  . Hypogonadism in male 11/01/2016  . Type 1 diabetes mellitus ([place])   . Hypothyroidism   . Hypertension   . Hyperlipidemia   . Closed fracture of right fibula and tibia 10/29/2016  . Closed tibia fracture 10/29/2016  . Closed fracture of right tibial plafond with fibula involvement 10/29/2016  . Abnormal liver function tests 03/06/2013  . RASH AND OTHER NONSPECIFIC SKIN ERUPTION 10/07/2009  . SHOULDER PAIN, LEFT 12/27/2008  . Type I (juvenile type) diabetes mellitus without mention of complication, uncontrolled 12/03/2007  .  HYPOTHYROIDISM 11/12/2007  . HYPERCHOLESTEROLEMIA 11/12/2007  . MULTIPLE SCLEROSIS 08/29/2007  . PROLIFERATIVE DIABETIC RETINOPATHY 08/29/2007  . HYPERTENSION 12/25/2006    Alda Lea, PT 03/24/2019, 8:14 AM  [facility] [address] [place], [place], [address] Phone: [phone]   Fax:  [phone]  Name: JAMEIS KOLTES MRN: [id number] Date of Birth: 17-Jun-1970

## 2019-03-24 NOTE — Telephone Encounter (Signed)
Patient called in upset because he doesn't understand why his prescription for UNITHROID 125 MCG tablet was switched to [facility]. Doesn't understand how he was switched when he hasn't had a office visit yet    Please advise

## 2019-03-25 ENCOUNTER — Other Ambulatory Visit: Payer: Self-pay

## 2019-03-25 ENCOUNTER — Encounter: Payer: Self-pay | Admitting: Physical Therapy

## 2019-03-25 ENCOUNTER — Ambulatory Visit: Payer: Medicare Other | Admitting: Physical Therapy

## 2019-03-25 DIAGNOSIS — M6281 Muscle weakness (generalized): Secondary | ICD-10-CM | POA: Diagnosis not present

## 2019-03-25 DIAGNOSIS — R2681 Unsteadiness on feet: Secondary | ICD-10-CM

## 2019-03-25 DIAGNOSIS — M21372 Foot drop, left foot: Secondary | ICD-10-CM | POA: Diagnosis not present

## 2019-03-25 DIAGNOSIS — R296 Repeated falls: Secondary | ICD-10-CM | POA: Diagnosis not present

## 2019-03-25 DIAGNOSIS — R2689 Other abnormalities of gait and mobility: Secondary | ICD-10-CM | POA: Diagnosis not present

## 2019-03-25 DIAGNOSIS — M21371 Foot drop, right foot: Secondary | ICD-10-CM | POA: Diagnosis not present

## 2019-03-25 NOTE — Therapy (Signed)
[facility] [address] [place], [place], [address] Phone: [phone]   Fax:  [phone]  Physical Therapy Treatment  Patient Details  Name: Johnathan Ross MRN: [id number] Date of Birth: April 13, 1971 Referring Provider (PT): Carylon Perches, MD   Encounter Date: 03/25/2019  PT End of Session - 03/25/19 0854    Visit Number  7    Number of Visits  17    Date for PT Re-Evaluation  03/31/19    Authorization Type  Medicare Part A and B; 10th visit PN    PT Start Time  0848    PT Stop Time  0930    PT Time Calculation (min)  42 min    Equipment Utilized During Treatment  Gait belt    Activity Tolerance  Patient limited by fatigue    Behavior During Therapy  [facility] for tasks assessed/performed       Past Medical History:  Diagnosis Date  . Hyperlipidemia   . Hypertension   . Hypogonadism in male 11/01/2016  . Hypothyroidism   . Multiple sclerosis ([place])   . 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 Type 1 diabetes mellitus ([place]) dx'd 1994  . Vitamin D insufficiency 11/29/2016    Past Surgical History:  Procedure Laterality Date  . EYE SURGERY Bilateral    "laser for diabetic retinopathy"  . FRACTURE SURGERY    . OPEN REDUCTION INTERNAL FIXATION (ORIF) TIBIA/FIBULA FRACTURE Right 10/30/2016  . ORIF ANKLE FRACTURE Left 2015  . ORIF TIBIA FRACTURE Right 10/30/2016   Procedure: OPEN REDUCTION INTERNAL FIXATION (ORIF) TIBIA FIBULA FRACTURE;  Surgeon: Altamese [place], MD;  Location: [facility];  Service: Orthopedics;  Laterality: Right;  . RETINAL LASER PROCEDURE Bilateral    "for diabetic retinopathy"    There were no vitals filed for this visit.  Subjective Assessment - 03/25/19 0851    Subjective  No new complains. See's Vascular next week for follow up. No falls.    Pertinent History  multiple falls, vitamin D insufficiency, Type 1 IDDM, diabetic retinopathy, hypothyroidism, HTN, hyperlipidemia, R tibia fracture,  L ankle fracture with ORIF    Limitations  Walking    Diagnostic tests  No new lesions seen on MRI    Patient Stated Goals  To improve walking    Currently in Pain?  No/denies          [facility] Adult PT Treatment/Exercise - 03/25/19 0855      Transfers   Transfers  Sit to Stand;Stand to Sit    Sit to Stand  5: Supervision    Stand to Sit  5: Supervision      Ambulation/Gait   Ambulation/Gait  Yes    Ambulation/Gait Assistance Details  starts with improved foot clearance and knee flexion with swing phase, however as pt fagitues increased tone present on right > left LE causing decreased knee flexion and limiting DF as well. improves after a seated rest break.     Ambulation Distance (Feet)  70 Feet   x2 with AFO;  x2, x1 with bioness   Assistive device  Rolling walker;Other (Comment)   Bioness to bil LE's (ant tib)   Gait Pattern  Step-to pattern;Decreased step length - right;Decreased stride length;Decreased hip/knee flexion - right;Decreased dorsiflexion - right;Right hip hike;Right genu recurvatum;Poor foot clearance - right    Ambulation Surface  Level;Indoor      Modalities   Modalities  Energy manager  bil anterior tib's     Electrical Stimulation Action  improved DF with swing phase and decreased foot slap    Electrical Stimulation Parameters  refer to tablet 1, steering electrodes    Electrical Stimulation Goals  Strength;Neuromuscular facilitation          PT Short Term Goals - 03/24/19 [id number]      PT SHORT TERM GOAL #1   Title  Patient will demonstrate independence with updated HEP    Time  4    Period  Weeks    Status  Deferred    Target Date  03/20/19   target date extended due to pt on hold due to DVT in LUE     PT SHORT TERM GOAL #2   Title  Pt will improve gait velocity with RW to >/= 1.0 ft/sec with decreased use of UE to lift and swing RLE through    Baseline  .7 ft/sec with RW/AFO     Time  4    Period  Weeks    Status  Deferred    Target Date  03/20/19      PT SHORT TERM GOAL #3   Title  Pt will improve BERG to >/= 33/56    Baseline  29/56    Time  4    Period  Weeks    Status  Deferred    Target Date  03/20/19      PT SHORT TERM GOAL #4   Title  Pt will ambulate x 230' over indoor surfaces with RW MOD I with improved R hip and knee flexion and foot clearance without lifting through UE    Time  4    Period  Weeks    Status  Deferred    Target Date  03/20/19      PT SHORT TERM GOAL #5   Title  Pt will negotate 8 stairs with two rails step to sequence with supervision    Time  4    Period  Weeks    Status  Deferred    Target Date  03/20/19        PT Long Term Goals - 03/24/19 [id number]      PT LONG TERM GOAL #1   Title  Pt will demonstrate independence with land and aquatic HEP and will report going to [facility] 1-2x/week for ongoing fitness    Time  8    Period  Weeks    Status  New      PT LONG TERM GOAL #2   Title  Pt will improve gait velocity with RW to >/= 1.8 ft/sec    Time  8    Period  Weeks    Status  New      PT LONG TERM GOAL #3   Title  Pt will improve BERG to >/= 37/56 to decrease falls risk    Time  8    Period  Weeks    Status  New      PT LONG TERM GOAL #4   Title  Pt will ambulate x 200' outside over pavement and grass with supervision and LRAD to improve safety ambulating to wood working shop at home    Time  8    Period  Weeks    Status  New      PT LONG TERM GOAL #5   Title  Pt will negotiate 8 stairs with 2 rails with alternating sequence with supervision    Time  8  Period  Weeks    Status  New            Plan - 03/25/19 0854    Clinical Impression Statement  Today's skilled session focused on re-setup of Bioness to bil LE's (anterior tib only due to pt with tight jeans on, he is to bring shorts for next session). Use of Bioness with gait with good form/foot clearance noted until fatigue set in with increased  extensor tone kicking in, causing increased impaired gait mechanics. Will plan to try Bioness to bil hamstrings at next session to assess if this decreases tone/allows for improved knee flexion with gait. The pt is making steady progress toward goals and should benefit from continued PT to progress toward unmet goals.    Personal Factors and Comorbidities  Comorbidity 3+;Past/Current Experience    Comorbidities  multiple falls, vitamin D insufficiency, Type 1 IDDM, diabetic retinopathy, hypothyroidism, HTN, hyperlipidemia, R tibia fracture, L ankle fracture with ORIF    Examination-Activity Limitations  Carry;Locomotion Level;Stairs;Stand;Transfers;Squat    Examination-Participation Restrictions  Community Activity;Driving;Yard Work    Merchant [employer] officer  Evolving/Moderate complexity    Rehab Potential  Good    PT Frequency  2x / week    PT Duration  8 weeks    PT Treatment/Interventions  ADLs/Self Care Home Management;Aquatic Therapy;Electrical Stimulation;DME Instruction;Gait training;Stair training;Functional mobility training;Therapeutic activities;Therapeutic exercise;Balance training;Neuromuscular re-education;Patient/family education;Orthotic Fit/Training;Passive range of motion    PT Next Visit Plan  set up Bioness to right hamstring, ? if needed on tentative appt for aquatic set for 04/13/19- want clearance from Vascular prior to this;  Endurance on [employer].  Continue to focus on core/postural control training without UE support, hip flexion and knee flexion strengthening, ankle DF strength - sitting on therapy ball.  SLS and weight shift training (rockerboard).    Consulted and Agree with Plan of Care  Patient       Patient will benefit from skilled therapeutic intervention in order to improve the following deficits and impairments:  Abnormal gait, Decreased balance, Decreased endurance, Decreased coordination, Decreased range of motion, Decreased strength, Difficulty  walking, Impaired tone, Postural dysfunction  Visit Diagnosis: Other abnormalities of gait and mobility  Muscle weakness (generalized)  Unsteadiness on feet     Problem List Patient Active Problem List   Diagnosis Date Noted  . Vitamin D insufficiency 11/29/2016  . Hypogonadism in male 11/01/2016  . Type 1 diabetes mellitus ([place])   . Hypothyroidism   . Hypertension   . Hyperlipidemia   . Closed fracture of right fibula and tibia 10/29/2016  . Closed tibia fracture 10/29/2016  . Closed fracture of right tibial plafond with fibula involvement 10/29/2016  . Abnormal liver function tests 03/06/2013  . RASH AND OTHER NONSPECIFIC SKIN ERUPTION 10/07/2009  . SHOULDER PAIN, LEFT 12/27/2008  . Type I (juvenile type) diabetes mellitus without mention of complication, uncontrolled 12/03/2007  . HYPOTHYROIDISM 11/12/2007  . HYPERCHOLESTEROLEMIA 11/12/2007  . MULTIPLE SCLEROSIS 08/29/2007  . PROLIFERATIVE DIABETIC RETINOPATHY 08/29/2007  . HYPERTENSION 12/25/2006   Willow Ora, PTA, [facility] [address], [place], [place] [address] [phone] 03/26/19, 1:43 PM   Name: Johnathan Ross MRN: [id number] Date of Birth: 1971/03/14

## 2019-03-25 NOTE — Telephone Encounter (Signed)
Called pt and spoke with him regarding this matter. Pt was reminded that approx 1-2 months ago, he checked with his insurance about coverage of name brand synthroid and they informed him that it would be $45. When he went to the pharmacy and attempted to pick it up, it was over $200. Pt notified this office, and Dr. Dwyane Dee was informed and he changed it to Unithroid name brand. Pt did verbalize understanding of this when the conversation took place. As of yesterday, pt's sister called upset and began cussing at staff members and saying that his medication was changed with out their knowledge. According to conversation today with pt, initially, he agreed about our prior conversation regarding the Synthroid being over $200, and changing to Unithroid. At the end of the conversation, pt stated that he had never heard of Unithroid until he went to the pharmacy yesterday.  I spoke with the pharmacist at pt's pharmacy and she informed us that the pt has been using a copay card that he obtained on his own and has been getting name brand Synthroid for $45 per month. Pt was advised that this office cannot know what medication he is taking if he obtains copay cards on his own and does not inform this office. For the last 1-2 months, this office was under the impression that he was taking Unithroid, only to find out that he is taking Synthoid by using a copay card. Pt was advised that when his sister called, she cannot reasonably expect Dr. Dwyane Dee to know things which the pt does not inform him of. Also addressed the issue of the pt's sister using profane language with the front office staff. Pt was advised that typically, this is not allowed of pt's or family members, and it is also not nice or appropriate. Pt then stated "how are you gonna tell me what we can and can't do?" I then stated that "we cannot stop you from cursing at staff, I am just informing you that its not appropriate." Pt then responds with "Well we ain't gonna  talk about this no more. I'm going to get Unithroid, and i'll start taking that. I have an appointment on the 16'th. Do I need to cancel that since I am changing medication?" pt was advised that if he still has name brand Synthroid, he does not now have to change, and that he can continue to use the copay card he obtained himself. The issue was that pt and sister expected this office to know this information when pt and sister did not tell the office. Pt then insisted that he would change to Unithroid and again questioned whether he should be push his appointment back to allow new medication to adjust. Pt was then advised that if he does change medications, it may not be a bad idea to push the appt back 2-3 weeks. Pt stated "well that's what I'll do then, have a nice day." I then told pt to have a great day as well, and at this point, pt had already hung up the phone. Yancey Flemings, practice administrator was advised of the conversation that had taken place.

## 2019-03-26 NOTE — Telephone Encounter (Signed)
Spoke with Pt's sister at length yesterday to gather understanding of the current situation. I explained to her that there has been no new medication called in for the pt. unithroid was called in back in September and that is what he was supposed to have been taking since the last discussion with Olen Cordial. I let her know I am unsure of how it transpired with him still getting Synthroid.  Let the sister know to please get back with me so I know whether the pt has received his medication.  Sister called and stated the pharmacy doesn't have unithroid  [facility] pharmacy and the call could not be connect possible power out  ATC the sister to let her know and no answer and the VM is full

## 2019-03-26 NOTE — Telephone Encounter (Signed)
Spoke with the pharmacy, unithroid is not and will not be a brand option for this patient for at least the next few months so they are disgarding the unithroid rx. They still have the synthroid rx on hand and has refills they will be asking for a new rx when the refills run out and it will need to stay synthroid per the pharmacist due to the lack of the medication.   Sister is aware of above  Patient is aware to get rx today from pharmacy for Synthroid  All concerns addressed.

## 2019-03-26 NOTE — Telephone Encounter (Signed)
I spoke with a pharmacy tech yesterday regarding the Unithroid. They confirmed they do have this on file, and attempted to give this to the pt. This is how the issue transpired initially. Pt went to pick up medication and he had been getting Synthroid via copay card and never notified this office, and the pharmacy attempted to give him Unithroid, which is what the MD prescribed.

## 2019-03-27 ENCOUNTER — Ambulatory Visit: Payer: Medicare Other | Admitting: Physical Therapy

## 2019-04-02 ENCOUNTER — Ambulatory Visit: Payer: Medicare Other | Attending: Urology | Admitting: Physical Therapy

## 2019-04-02 ENCOUNTER — Other Ambulatory Visit: Payer: Self-pay

## 2019-04-02 ENCOUNTER — Encounter: Payer: Self-pay | Admitting: Physical Therapy

## 2019-04-02 DIAGNOSIS — R2681 Unsteadiness on feet: Secondary | ICD-10-CM | POA: Diagnosis not present

## 2019-04-02 DIAGNOSIS — M6281 Muscle weakness (generalized): Secondary | ICD-10-CM | POA: Diagnosis not present

## 2019-04-02 DIAGNOSIS — R2689 Other abnormalities of gait and mobility: Secondary | ICD-10-CM | POA: Diagnosis not present

## 2019-04-02 DIAGNOSIS — M21371 Foot drop, right foot: Secondary | ICD-10-CM | POA: Diagnosis not present

## 2019-04-02 DIAGNOSIS — M25671 Stiffness of right ankle, not elsewhere classified: Secondary | ICD-10-CM | POA: Diagnosis not present

## 2019-04-02 DIAGNOSIS — R262 Difficulty in walking, not elsewhere classified: Secondary | ICD-10-CM

## 2019-04-02 DIAGNOSIS — R296 Repeated falls: Secondary | ICD-10-CM | POA: Diagnosis not present

## 2019-04-02 DIAGNOSIS — M21372 Foot drop, left foot: Secondary | ICD-10-CM | POA: Diagnosis not present

## 2019-04-03 ENCOUNTER — Telehealth: Payer: Self-pay

## 2019-04-03 NOTE — Telephone Encounter (Signed)
Patient called in stating pharmacy has not receive script for pump supplies could someone please him resolve this issues     Please call and advise

## 2019-04-05 NOTE — Therapy (Signed)
[facility] [address] [place], [place], [address] Phone: [phone]   Fax:  [phone]  Physical Therapy Treatment  Patient Details  Name: Johnathan Ross MRN: [id number] Date of Birth: 04/29/1971 Referring Provider (PT): Carylon Perches, MD   Encounter Date: 04/02/2019   04/02/19 0854  PT Visits / Re-Eval  Visit Number 8  Number of Visits 17  Date for PT Re-Evaluation 03/31/19  Authorization  Authorization Type Medicare Part A and B; 10th visit PN  PT Time Calculation  PT Start Time 0848  PT Stop Time 0930  PT Time Calculation (min) 42 min  PT - End of Session  Equipment Utilized During Treatment Gait belt  Activity Tolerance Patient limited by fatigue  Behavior During Therapy [facility] for tasks assessed/performed     Past Medical History:  Diagnosis Date  . Hyperlipidemia   . Hypertension   . Hypogonadism in male 11/01/2016  . Hypothyroidism   . Multiple sclerosis ([place])   . 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 Type 1 diabetes mellitus ([place]) dx'd 1994  . Vitamin D insufficiency 11/29/2016    Past Surgical History:  Procedure Laterality Date  . EYE SURGERY Bilateral    "laser for diabetic retinopathy"  . FRACTURE SURGERY    . OPEN REDUCTION INTERNAL FIXATION (ORIF) TIBIA/FIBULA FRACTURE Right 10/30/2016  . ORIF ANKLE FRACTURE Left 2015  . ORIF TIBIA FRACTURE Right 10/30/2016   Procedure: OPEN REDUCTION INTERNAL FIXATION (ORIF) TIBIA FIBULA FRACTURE;  Surgeon: Altamese Luckey, MD;  Location: [facility];  Service: Orthopedics;  Laterality: Right;  . RETINAL LASER PROCEDURE Bilateral    "for diabetic retinopathy"    There were no vitals filed for this visit.     04/02/19 0852  Symptoms/Limitations  Subjective No new complaints. No falls or pain. New medication for tone/spasticity (Zanaflex) is not working, has called the MD about this and is scheduled for Botox in January.  Pertinent History  multiple falls, vitamin D insufficiency, Type 1 IDDM, diabetic retinopathy, hypothyroidism, HTN, hyperlipidemia, R tibia fracture, L ankle fracture with ORIF  Limitations Walking  Diagnostic tests No new lesions seen on MRI  Patient Stated Goals To improve walking  Pain Assessment  Currently in Pain? No/denies    [facility] Adult PT Treatment/Exercise - 04/05/19 1844      Transfers   Transfers  Sit to Stand;Stand to Sit    Sit to Stand  5: Supervision;With upper extremity assist;From chair/3-in-1;From bed    Stand to Sit  5: Supervision;With upper extremity assist;To bed;To chair/3-in-1      Ambulation/Gait   Ambulation/Gait  Yes    Ambulation/Gait Assistance  4: Min guard    Ambulation/Gait Assistance Details  use of Bioness to bil ant tib's and hamstrings. Pt with improved foot clearance and knee flexion with swing phase. does fatigue quickly with extensor tone kicking in, however able to go further each time before this occurred. Standing rest breaks needed due to this with each gait rep.     Ambulation Distance (Feet)  70 Feet   x4   Assistive device  Rolling walker;Other (Comment)   Bioness to bil LE's   Gait Pattern  Step-to pattern;Step-through pattern;Decreased stride length;Decreased step length - right;Decreased step length - left;Decreased trunk rotation;Narrow base of support;Poor foot clearance - left;Poor foot clearance - right      Electrical Stimulation   Electrical Stimulation Location  bil anterior tib's and bil hamstrings    Electrical Stimulation Action  for increased DF,  increased knee flexion and decreased tone with gait.     Electrical Stimulation Parameters  refer to tablet 1; steering electrodes.    Electrical Stimulation Goals  Strength;Neuromuscular facilitation           PT Short Term Goals - 03/24/19 [id number]      PT SHORT TERM GOAL #1   Title  Patient will demonstrate independence with updated HEP    Time  4    Period  Weeks    Status  Deferred     Target Date  03/20/19   target date extended due to pt on hold due to DVT in LUE     PT SHORT TERM GOAL #2   Title  Pt will improve gait velocity with RW to >/= 1.0 ft/sec with decreased use of UE to lift and swing RLE through    Baseline  .105 ft/sec with RW/AFO    Time  4    Period  Weeks    Status  Deferred    Target Date  03/20/19      PT SHORT TERM GOAL #3   Title  Pt will improve BERG to >/= 33/56    Baseline  29/56    Time  4    Period  Weeks    Status  Deferred    Target Date  03/20/19      PT SHORT TERM GOAL #4   Title  Pt will ambulate x 230' over indoor surfaces with RW MOD I with improved R hip and knee flexion and foot clearance without lifting through UE    Time  4    Period  Weeks    Status  Deferred    Target Date  03/20/19      PT SHORT TERM GOAL #5   Title  Pt will negotate 8 stairs with two rails step to sequence with supervision    Time  4    Period  Weeks    Status  Deferred    Target Date  03/20/19        PT Long Term Goals - 03/24/19 [id number]      PT LONG TERM GOAL #1   Title  Pt will demonstrate independence with land and aquatic HEP and will report going to [facility] 1-2x/week for ongoing fitness    Time  8    Period  Weeks    Status  New      PT LONG TERM GOAL #2   Title  Pt will improve gait velocity with RW to >/= 1.8 ft/sec    Time  8    Period  Weeks    Status  New      PT LONG TERM GOAL #3   Title  Pt will improve BERG to >/= 37/56 to decrease falls risk    Time  8    Period  Weeks    Status  New      PT LONG TERM GOAL #4   Title  Pt will ambulate x 200' outside over pavement and grass with supervision and LRAD to improve safety ambulating to wood working shop at home    Time  8    Period  Weeks    Status  New      PT LONG TERM GOAL #5   Title  Pt will negotiate 8 stairs with 2 rails with alternating sequence with supervision    Time  8    Period  Weeks    Status  New  04/02/19 0854  Plan  Clinical Impression Statement  Today's skilled session continued with use of Bioness to bil LE's with bil Hamstrings set up this session. Pt noted to have improved gait pattern with use of Bioness with increased distance before tone kicks in. Rest breaks needed due to extensor tone with gait. The pt is progressing toward goals and should benefit from continued PT to progress toward unmet goals.  Personal Factors and Comorbidities Comorbidity 3+;Past/Current Experience  Comorbidities multiple falls, vitamin D insufficiency, Type 1 IDDM, diabetic retinopathy, hypothyroidism, HTN, hyperlipidemia, R tibia fracture, L ankle fracture with ORIF  Examination-Activity Limitations Carry;Locomotion Level;Stairs;Stand;Transfers;Squat  Examination-Participation Restrictions Community Activity;Driving;Yard Work  Pt will benefit from skilled therapeutic intervention in order to improve on the following deficits Abnormal gait;Decreased balance;Decreased endurance;Decreased coordination;Decreased range of motion;Decreased strength;Difficulty walking;Impaired tone;Postural dysfunction  Stability/Clinical Decision Making Evolving/Moderate complexity  Rehab Potential Good  PT Frequency 2x / week  PT Duration 8 weeks  PT Treatment/Interventions ADLs/Self Care Home Management;Aquatic Therapy;Electrical Stimulation;DME Instruction;Gait training;Stair training;Functional mobility training;Therapeutic activities;Therapeutic exercise;Balance training;Neuromuscular re-education;Patient/family education;Orthotic Fit/Training;Passive range of motion  PT Next Visit Plan did he get clearance from Vascular for aquatics; use of Bioness on hamstrings for knee flexion/LE strengthening (prone ex's);  Endurance on [employer].  Continue to focus on core/postural control training without UE support, hip flexion and knee flexion strengthening, ankle DF strength - sitting on therapy ball.  SLS and weight shift training (rockerboard).  Consulted and Agree with Plan of Care  Patient          Patient will benefit from skilled therapeutic intervention in order to improve the following deficits and impairments:  Abnormal gait, Decreased balance, Decreased endurance, Decreased coordination, Decreased range of motion, Decreased strength, Difficulty walking, Impaired tone, Postural dysfunction  Visit Diagnosis: Other abnormalities of gait and mobility  Muscle weakness (generalized)  Unsteadiness on feet  Repeated falls  Foot drop, right  Foot drop, left  Difficulty in walking, not elsewhere classified  Stiffness of right ankle, not elsewhere classified     Problem List Patient Active Problem List   Diagnosis Date Noted  . Vitamin D insufficiency 11/29/2016  . Hypogonadism in male 11/01/2016  . Type 1 diabetes mellitus ([place])   . Hypothyroidism   . Hypertension   . Hyperlipidemia   . Closed fracture of right fibula and tibia 10/29/2016  . Closed tibia fracture 10/29/2016  . Closed fracture of right tibial plafond with fibula involvement 10/29/2016  . Abnormal liver function tests 03/06/2013  . RASH AND OTHER NONSPECIFIC SKIN ERUPTION 10/07/2009  . SHOULDER PAIN, LEFT 12/27/2008  . Type I (juvenile type) diabetes mellitus without mention of complication, uncontrolled 12/03/2007  . HYPOTHYROIDISM 11/12/2007  . HYPERCHOLESTEROLEMIA 11/12/2007  . MULTIPLE SCLEROSIS 08/29/2007  . PROLIFERATIVE DIABETIC RETINOPATHY 08/29/2007  . HYPERTENSION 12/25/2006    Willow Ora, PTA, [facility] [address] [place], [place] [address] [phone] 04/05/19, 6:40 PM   Name: Johnathan Ross MRN: [id number] Date of Birth: 12/14/1970

## 2019-04-06 ENCOUNTER — Encounter: Payer: Self-pay | Admitting: Surgery

## 2019-04-06 ENCOUNTER — Ambulatory Visit (INDEPENDENT_AMBULATORY_CARE_PROVIDER_SITE_OTHER): Payer: Medicare Other | Admitting: Surgery

## 2019-04-06 ENCOUNTER — Other Ambulatory Visit: Payer: Self-pay

## 2019-04-06 VITALS — BP 112/63 | HR 81 | Temp 97.6°F | Resp 20 | Ht 73.0 in | Wt 192.3 lb

## 2019-04-06 DIAGNOSIS — I82A12 Acute embolism and thrombosis of left axillary vein: Secondary | ICD-10-CM

## 2019-04-06 NOTE — Progress Notes (Signed)
[facility]  Patient name: Johnathan Ross MRN: [id number] DOB: Oct 24, 1970 Sex: male   REQUESTING PROVIDER:    Dr. Valere Dross   REASON FOR CONSULT:    Leg swelling   HISTORY OF PRESENT ILLNESS:   Johnathan Ross is a 48 y.o. male, who is referred for further evaluation of a left arm DVT.  The patient states that he woke up 1 morning and had a significant left arm swelling.  He has been having some pain in his shoulder for the days prior to this.  He went to the emergency department and was diagnosed with a subclavian/axillary DVT.  He was immediately started on anticoagulation. The swelling began to go down 2 days later and it has resolved now.  The patient has uncontrolled type 1 diabetes.  She is medically managed for hypertension with an ACE inhibitor.  She is on statin for hypercholesterolemia.  She has multiple sclerosis.  She is a non-smoker.  PAST MEDICAL HISTORY    Past Medical History:  Diagnosis Date  . Hyperlipidemia   . Hypertension   . Hypogonadism in male 11/01/2016  . Hypothyroidism   . Multiple sclerosis ([place])   . 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 Type 1 diabetes mellitus ([place]) dx'd 1994  . Vitamin D insufficiency 11/29/2016     FAMILY HISTORY   Family History  Problem Relation Age of Onset  . Diabetes Sister   . Colon cancer Maternal Grandmother   . Colon polyps Maternal Uncle     SOCIAL HISTORY:   Social History   Socioeconomic History  . Marital status: Single    Spouse name: Not on file  . Number of children: Not on file  . Years of education: Not on file  . Highest education level: Not on file  Occupational History  . Not on file  Social Needs  . Financial resource strain: Not on file  . Food insecurity    Worry: Not on file    Inability: Not on file  . Transportation needs    Medical: Not on file    Non-medical: Not on file  Tobacco Use  . Smoking status:  Never Smoker  . Smokeless tobacco: Never Used  Substance and Sexual Activity  . Alcohol use: No  . Drug use: No  . Sexual activity: Not on file  Lifestyle  . Physical activity    Days per week: Not on file    Minutes per session: Not on file  . Stress: Not on file  Relationships  . Social Herbalist on phone: Not on file    Gets together: Not on file    Attends religious service: Not on file    Active member of club or organization: Not on file    Attends meetings of clubs or organizations: Not on file    Relationship status: Not on file  . Intimate partner violence    Fear of current or ex partner: Not on file    Emotionally abused: Not on file    Physically abused: Not on file    Forced sexual activity: Not on file  Other Topics Concern  . Not on file  Social History Narrative   No caffeine     ALLERGIES:    No Known Allergies  CURRENT MEDICATIONS:    Current Outpatient Medications  Medication Sig Dispense Refill  . CONTOUR NEXT TEST test strip USE AS INSTRUCTED TO CHECK BLOOD SUGARS 8 TIMES DAILY (  Patient taking differently: 1 each by Other route See admin instructions. Test BGL 8 times daily) 300 strip 3  . dalfampridine (AMPYRA) 10 MG TB12 Take 10 mg by mouth See admin instructions. Take 10 mg by mouth in the morning and 10 mg mid-afternoon    . insulin lispro (HUMALOG) 100 UNIT/ML injection Use max of 80 units daily via insulin pump. **replaces Novolog since Psychiatric nurse cover.** (Patient taking differently: See admin instructions. Use max of 80 units daily via insulin pump. **replaces Novolog since insurance won't cover.**) 30 mL 3  . lisinopril (ZESTRIL) 10 MG tablet TAKE 1 TABLET BY MOUTH EVERY DAY 90 tablet 0  . rivaroxaban (XARELTO) 20 MG TABS tablet Take 1 tablet (20 mg total) by mouth daily with supper. 30 tablet 2  . simvastatin (ZOCOR) 20 MG tablet TAKE 1 TABLET BY MOUTH AT BEDTIME 90 tablet 0  . tiZANidine (ZANAFLEX) 4 MG capsule Take 4 mg by  mouth 3 (three) times daily.    [place] Kitchen UNITHROID 125 MCG tablet Take 1 tablet (125 mcg total) by mouth daily before breakfast. 30 tablet 3   No current facility-administered medications for this visit.     REVIEW OF SYSTEMS:   [X]  denotes positive finding, [ ]  denotes negative finding Cardiac  Comments:  Chest pain or chest pressure:    Shortness of breath upon exertion:    Short of breath when lying flat:    Irregular heart rhythm:        Vascular    Pain in calf, thigh, or hip brought on by ambulation:    Pain in feet at night that wakes you up from your sleep:     Blood clot in your veins:    Leg swelling:         Pulmonary    Oxygen at home:    Productive cough:     Wheezing:         Neurologic    Sudden weakness in arms or legs:     Sudden numbness in arms or legs:     Sudden onset of difficulty speaking or slurred speech:    Temporary loss of vision in one eye:     Problems with dizziness:         Gastrointestinal    Blood in stool:      Vomited blood:         Genitourinary    Burning when urinating:     Blood in urine:        Psychiatric    Major depression:         Hematologic    Bleeding problems:    Problems with blood clotting too easily:        Skin    Rashes or ulcers:        Constitutional    Fever or chills:     PHYSICAL EXAM:   Vitals:   04/06/19 1022  BP: 112/63  Pulse: 81  Resp: 20  Temp: 97.6 F (36.4 C)  SpO2: 99%  Weight: 192 lb 4.8 oz (87.2 kg)  Height: 6\' 1"  (1.854 m)    GENERAL: The patient is a well-nourished male, in no acute distress. The vital signs are documented above. CARDIAC: There is a regular rate and rhythm.  VASCULAR: No significant upper arm swelling.  Radial pulses PULMONARY: Nonlabored respirations MUSCULOSKELETAL: There are no major deformities or cyanosis. NEUROLOGIC: No focal weakness or paresthesias are detected. SKIN: There are no ulcers or rashes noted. PSYCHIATRIC:  The patient has a normal affect.   STUDIES:   I have ordered and reviewed the following: Findings consistent with acute deep vein thrombosis involving the left axillary vein and left subclavian vein. Acute DVT noted in the left axillary beginning in the axilla and extending into the subclavian vein.    ASSESSMENT and PLAN   Unprovoked left upper extremity DVT: The patient was immediately started on anticoagulation.  His upper extremity swelling has resolved.  Given the patient's MS and use of his upper body with his walker, I suspect this is a form of venous thoracic outlet syndrome.  Fortunately, the patient symptoms have completely resolved.  Since we are outside the ideal treatment window for lytic therapy, I would not recommend thrombolysis at this time.  I would recommend anticoagulation for 3-6 months.  After completing this course, he could come off of medication and undergo hypercoagulable work-up which would determine whether or not anticoagulation needed to be restarted, or he could just continue with an aspirin.  At this time, no vascular intervention is recommended.  If he does develop swelling in the future, I would consider venography.   Leia Alf, MD, FACS [facility] [phone] Pager [phone]

## 2019-04-06 NOTE — Telephone Encounter (Signed)
Sent a pt MyChart message informing him that we have not received paperwork as of this time and requested that he contact his DME supplier and have this paperwork faxed to our office.

## 2019-04-08 ENCOUNTER — Telehealth: Payer: Self-pay | Admitting: Nutrition

## 2019-04-08 ENCOUNTER — Ambulatory Visit: Payer: Medicare Other | Admitting: Physical Therapy

## 2019-04-08 DIAGNOSIS — G35 Multiple sclerosis: Secondary | ICD-10-CM | POA: Diagnosis not present

## 2019-04-08 DIAGNOSIS — M62838 Other muscle spasm: Secondary | ICD-10-CM | POA: Diagnosis not present

## 2019-04-08 NOTE — Telephone Encounter (Signed)
That will be fine. 

## 2019-04-08 NOTE — Telephone Encounter (Signed)
I called him, and he says that his insurance will pay for the Dexcom.  He is coming in to see Dr. Dwyane Dee on Monday.  I am going to give him the reader and his first sample, and show him how to use it, and link it to [place] while he is here.

## 2019-04-09 ENCOUNTER — Other Ambulatory Visit: Payer: Self-pay

## 2019-04-10 ENCOUNTER — Other Ambulatory Visit: Payer: Self-pay

## 2019-04-10 ENCOUNTER — Ambulatory Visit: Payer: Medicare Other | Admitting: Physical Therapy

## 2019-04-10 ENCOUNTER — Encounter: Payer: Self-pay | Admitting: Physical Therapy

## 2019-04-10 DIAGNOSIS — M21372 Foot drop, left foot: Secondary | ICD-10-CM | POA: Diagnosis not present

## 2019-04-10 DIAGNOSIS — M6281 Muscle weakness (generalized): Secondary | ICD-10-CM

## 2019-04-10 DIAGNOSIS — R2681 Unsteadiness on feet: Secondary | ICD-10-CM | POA: Diagnosis not present

## 2019-04-10 DIAGNOSIS — M21371 Foot drop, right foot: Secondary | ICD-10-CM | POA: Diagnosis not present

## 2019-04-10 DIAGNOSIS — R2689 Other abnormalities of gait and mobility: Secondary | ICD-10-CM | POA: Diagnosis not present

## 2019-04-10 DIAGNOSIS — R296 Repeated falls: Secondary | ICD-10-CM | POA: Diagnosis not present

## 2019-04-12 NOTE — Therapy (Signed)
[facility] [address] [place], [place], [address] Phone: [phone]   Fax:  [phone]  Physical Therapy Treatment  Patient Details  Name: Johnathan Ross MRN: [id number] Date of Birth: 05/13/1971 Referring Provider (PT): Johnathan Perches, MD   Encounter Date: 04/10/2019   04/10/19 1942  PT Visits / Re-Eval  Visit Number 9  Number of Visits 17  Date for PT Re-Evaluation 03/31/19  Authorization  Authorization Type Medicare Part A and B; 10th visit PN  PT Time Calculation  PT Start Time 0933  PT Stop Time 1015  PT Time Calculation (min) 42 min  PT - End of Session  Equipment Utilized During Treatment Gait belt  Activity Tolerance Patient limited by fatigue  Behavior During Therapy [facility] for tasks assessed/performed     Past Medical History:  Diagnosis Date  . Hyperlipidemia   . Hypertension   . Hypogonadism in male 11/01/2016  . Hypothyroidism   . Multiple sclerosis (Johnathan Ross)   . 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 Type 1 diabetes mellitus ([place]) dx'd 1994  . Vitamin D insufficiency 11/29/2016    Past Surgical History:  Procedure Laterality Date  . EYE SURGERY Bilateral    "laser for diabetic retinopathy"  . FRACTURE SURGERY    . OPEN REDUCTION INTERNAL FIXATION (ORIF) TIBIA/FIBULA FRACTURE Right 10/30/2016  . ORIF ANKLE FRACTURE Left 2015  . ORIF TIBIA FRACTURE Right 10/30/2016   Procedure: OPEN REDUCTION INTERNAL FIXATION (ORIF) TIBIA FIBULA FRACTURE;  Surgeon: Johnathan Atlantic, MD;  Location: [facility];  Service: Orthopedics;  Laterality: Right;  . RETINAL LASER PROCEDURE Bilateral    "for diabetic retinopathy"    There were no vitals filed for this visit.      04/10/19 0939  Symptoms/Limitations  Subjective Saw Johnathan Ross who cleared him from everything and released him. Only would need to return if his arm swells up again. Then saw Neuro on Wed and had Botox to bil Quadriceps and  Gastocs. See's Neuro again in 6 weeks for Botox followup with another round of Botox after that.  No falls or pain to report.  Pertinent History multiple falls, vitamin D insufficiency, Type 1 IDDM, diabetic retinopathy, hypothyroidism, HTN, hyperlipidemia, R tibia fracture, L ankle fracture with ORIF  Limitations Walking  Diagnostic tests No new lesions seen on MRI  Patient Stated Goals To improve walking  Pain Assessment  Currently in Pain? No/denies  Pain Score 0     04/10/19 0948  Transfers  Transfers Sit to Stand;Stand to Sit  Sit to Stand 5: Supervision;With upper extremity assist;From chair/3-in-1;From bed  Stand to Sit 5: Supervision;With upper extremity assist;To bed;To chair/3-in-1  Ambulation/Gait  Ambulation/Gait Yes  Ambulation/Gait Assistance 4: Min guard  Ambulation/Gait Assistance Details with RW only., pt did not wear his brace in anticipation of having BIoness placed on. 2 short standing rest breaks needed both into and out of gym. no significant balance issues noted. increased tone noted at times in LE's limiting knee flexion right>left side.              Ambulation Distance (Feet)  (into/out of gym)  Assistive device Rolling walker  Gait Pattern Step-to pattern;Step-through pattern;Decreased stride length;Decreased step length - right;Decreased step length - left;Decreased trunk rotation;Narrow base of support;Poor foot clearance - left;Poor foot clearance - right  Ambulation Surface Level;Indoor  Knee/Hip Exercises: Stretches  Active Hamstring Stretch Both;3 reps;60 seconds;Limitations  Active Hamstring Stretch Limitations seated at edge of mat with cues on hold time  and technique   Knee/Hip Exercises: Prone  Hamstring Curl 10 reps;Limitations  Hip Extension AAROM;Strengthening;10 reps;5 reps;Limitations  Hamstring Curl Limitations with out Bioness for 5 reps each side with increased assist as pt fatigued; then with Bioness to hamstrings for 10 reps each side with  less assistance needed, more right>left side.                Hip Extension Limitations without Bioness 5 reps each side with min assist needed; with Bioness for 10 reps with up to min assist needed with right>left LE.   Other Prone Exercises glut kick backs x 10 reps each side with Bioness to hamstrings, up to min assist needed to maintain form and for ex technique.        PT Short Term Goals - 03/24/19 [id number]      PT SHORT TERM GOAL #1   Title  Patient will demonstrate independence with updated HEP    Time  4    Period  Weeks    Status  Deferred    Target Date  03/20/19   target date extended due to pt on hold due to DVT in LUE     PT SHORT TERM GOAL #2   Title  Pt will improve gait velocity with RW to >/= 1.0 ft/sec with decreased use of UE to lift and swing RLE through    Baseline  .71 ft/sec with RW/AFO    Time  4    Period  Weeks    Status  Deferred    Target Date  03/20/19      PT SHORT TERM GOAL #3   Title  Pt will improve BERG to >/= 33/56    Baseline  29/56    Time  4    Period  Weeks    Status  Deferred    Target Date  03/20/19      PT SHORT TERM GOAL #4   Title  Pt will ambulate x 230' over indoor surfaces with RW MOD I with improved R hip and knee flexion and foot clearance without lifting through UE    Time  4    Period  Weeks    Status  Deferred    Target Date  03/20/19      PT SHORT TERM GOAL #5   Title  Pt will negotate 8 stairs with two rails step to sequence with supervision    Time  4    Period  Weeks    Status  Deferred    Target Date  03/20/19        PT Long Term Goals - 03/24/19 [id number]      PT LONG TERM GOAL #1   Title  Pt will demonstrate independence with land and aquatic HEP and will report going to [facility] 1-2x/week for ongoing fitness    Time  8    Period  Weeks    Status  New      PT LONG TERM GOAL #2   Title  Pt will improve gait velocity with RW to >/= 1.8 ft/sec    Time  8    Period  Weeks    Status  New      PT LONG TERM GOAL #3    Title  Pt will improve BERG to >/= 37/56 to decrease falls risk    Time  8    Period  Weeks    Status  New      PT LONG TERM GOAL #4  Title  Pt will ambulate x 200' outside over pavement and grass with supervision and LRAD to improve safety ambulating to wood working shop at home    Time  [place] #5   Title  Pt will negotiate 8 stairs with 2 rails with alternating sequence with supervision    Time  8    Period  Weeks    Status  New         04/10/19 1943  Plan  Clinical Impression Statement Today's skilled session focused initally on stretching LE's, then on use of Bioness to hamstrings with strengthening ex's. Pt first performed most ex's without Bioness with assistance needed for fewer reps. Then performed ex's concurrent with Bioness with assist still needed for increased reps. The pt is making progress toward goals and should benefit from contiued PT to progress toward unmet goals.  Personal Factors and Comorbidities Comorbidity 3+;Past/Current Experience  Comorbidities multiple falls, vitamin D insufficiency, Type 1 IDDM, diabetic retinopathy, hypothyroidism, HTN, hyperlipidemia, R tibia fracture, L ankle fracture with ORIF  Examination-Activity Limitations Carry;Locomotion Level;Stairs;Stand;Transfers;Squat  Examination-Participation Restrictions Community Activity;Driving;Yard Work  Pt will benefit from skilled therapeutic intervention in order to improve on the following deficits Abnormal gait;Decreased balance;Decreased endurance;Decreased coordination;Decreased range of motion;Decreased strength;Difficulty walking;Impaired tone;Postural dysfunction  Stability/Clinical Decision Making Evolving/Moderate complexity  Rehab Potential Good  PT Frequency 2x / week  PT Duration 8 weeks  PT Treatment/Interventions ADLs/Self Care Home Management;Aquatic Therapy;Electrical Stimulation;DME Instruction;Gait training;Stair  training;Functional mobility training;Therapeutic activities;Therapeutic exercise;Balance training;Neuromuscular re-education;Patient/family education;Orthotic Fit/Training;Passive range of motion  PT Next Visit Plan 10th visit progress note due next visit; use of Bioness on hamstrings for knee flexion/LE strengthening (prone ex's);  Endurance on [employer].  Continue to focus on core/postural control training without UE support, hip flexion and knee flexion strengthening, ankle DF strength - sitting on therapy ball.  SLS and weight shift training (rockerboard).  Consulted and Agree with Plan of Care Patient          Patient will benefit from skilled therapeutic intervention in order to improve the following deficits and impairments:  Abnormal gait, Decreased balance, Decreased endurance, Decreased coordination, Decreased range of motion, Decreased strength, Difficulty walking, Impaired tone, Postural dysfunction  Visit Diagnosis: Muscle weakness (generalized)  Unsteadiness on feet     Problem List Patient Active Problem List   Diagnosis Date Noted  . Vitamin D insufficiency 11/29/2016  . Hypogonadism in male 11/01/2016  . Type 1 diabetes mellitus ([place])   . Hypothyroidism   . Hypertension   . Hyperlipidemia   . Closed fracture of right fibula and tibia 10/29/2016  . Closed tibia fracture 10/29/2016  . Closed fracture of right tibial plafond with fibula involvement 10/29/2016  . Abnormal liver function tests 03/06/2013  . RASH AND OTHER NONSPECIFIC SKIN ERUPTION 10/07/2009  . SHOULDER PAIN, LEFT 12/27/2008  . Type I (juvenile type) diabetes mellitus without mention of complication, uncontrolled 12/03/2007  . HYPOTHYROIDISM 11/12/2007  . HYPERCHOLESTEROLEMIA 11/12/2007  . MULTIPLE SCLEROSIS 08/29/2007  . PROLIFERATIVE DIABETIC RETINOPATHY 08/29/2007  . HYPERTENSION 12/25/2006    Willow Ora, PTA, [facility] [address] [place],   [address] [phone] 04/12/19, 7:43 PM   Name: Johnathan Ross MRN: [id number] Date of Birth: Apr 25, 1971

## 2019-04-13 ENCOUNTER — Ambulatory Visit: Payer: Medicare Other | Admitting: Endocrinology

## 2019-04-13 ENCOUNTER — Encounter: Payer: Self-pay | Admitting: Physical Therapy

## 2019-04-13 ENCOUNTER — Ambulatory Visit: Payer: Medicare Other | Admitting: Physical Therapy

## 2019-04-13 ENCOUNTER — Other Ambulatory Visit: Payer: Self-pay

## 2019-04-13 DIAGNOSIS — R2689 Other abnormalities of gait and mobility: Secondary | ICD-10-CM | POA: Diagnosis not present

## 2019-04-13 DIAGNOSIS — M6281 Muscle weakness (generalized): Secondary | ICD-10-CM

## 2019-04-13 DIAGNOSIS — R296 Repeated falls: Secondary | ICD-10-CM | POA: Diagnosis not present

## 2019-04-13 DIAGNOSIS — M21371 Foot drop, right foot: Secondary | ICD-10-CM | POA: Diagnosis not present

## 2019-04-13 DIAGNOSIS — R2681 Unsteadiness on feet: Secondary | ICD-10-CM | POA: Diagnosis not present

## 2019-04-13 DIAGNOSIS — M21372 Foot drop, left foot: Secondary | ICD-10-CM

## 2019-04-13 NOTE — Therapy (Addendum)
[facility] [address] [place], [place], [address] Phone: [phone]   Fax:  [phone]  Physical Therapy Treatment and 10th visit Progress note  Patient Details  Name: Johnathan Ross MRN: [id number] Date of Birth: Apr 07, 1971 Referring Provider (PT): Carylon Perches, MD   Encounter Date: 04/13/2019   Progress Note Reporting Period 01/30/2019 to 04/13/2019  See note below for Objective Data and Assessment of Progress/Goals.    PT End of Session - 04/13/19 1822    Visit Number  10    Number of Visits  17    Date for PT Re-Evaluation  03/31/19    Authorization Type  Medicare Part A and B; 10th visit PN    PT Start Time  1335    PT Stop Time  1420    PT Time Calculation (min)  45 min    Equipment Utilized During Treatment  Other (comment)   pool noodles   Activity Tolerance  Patient limited by fatigue    Behavior During Therapy  WFL for tasks assessed/performed       Past Medical History:  Diagnosis Date  . Hyperlipidemia   . Hypertension   . Hypogonadism in male 11/01/2016  . Hypothyroidism   . Multiple sclerosis ([place])   . 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 Type 1 diabetes mellitus ([place]) dx'd 1994  . Vitamin D insufficiency 11/29/2016    Past Surgical History:  Procedure Laterality Date  . EYE SURGERY Bilateral    "laser for diabetic retinopathy"  . FRACTURE SURGERY    . OPEN REDUCTION INTERNAL FIXATION (ORIF) TIBIA/FIBULA FRACTURE Right 10/30/2016  . ORIF ANKLE FRACTURE Left 2015  . ORIF TIBIA FRACTURE Right 10/30/2016   Procedure: OPEN REDUCTION INTERNAL FIXATION (ORIF) TIBIA FIBULA FRACTURE;  Surgeon: Altamese Prentiss, MD;  Location: [facility];  Service: Orthopedics;  Laterality: Right;  . RETINAL LASER PROCEDURE Bilateral    "for diabetic retinopathy"    There were no vitals filed for this visit.  Subjective Assessment - 04/13/19 1819    Subjective  Pt reports having increase spasticity  today and more difficulty with ambulation and mobility.  Pt had bil LE botox injections last week.    Pertinent History  multiple falls, vitamin D insufficiency, Type 1 IDDM, diabetic retinopathy, hypothyroidism, HTN, hyperlipidemia, R tibia fracture, L ankle fracture with ORIF    Limitations  Walking    Diagnostic tests  No new lesions seen on MRI    Patient Stated Goals  To improve walking    Currently in Pain?  No/denies        Aquatic therapy:  Pool temp 87.2 degrees   Patient seen for aquatic therapy today.  Treatment took place in water 3.5--4 feet deep depending upon activity.  Pt entered and exited the pool via step negotiation with use of bil. Hand rails with CGA with descension and ascension.   Pt performed runner's stretch for bilateral LE's for hamstring and heel cord stretching - 30 sec hold x 1 rep.  Bilateral toes up pool edge wall x 30 sec hold.  Pt gait trained 3m with pt holding onto noodle for support due to decreased balance with buoyancy and current of water.  Pt requiring significant UE assist keeping pool noodle stable and out of water with PTA walking in front of pt.  PT in pool also assisting with stabilizing pt at hips and assisting with weight shifting.  Repeated again x 25 m without PT assisting at hips.  Pt required frequent rest breaks during gait (approximately every 10-15').  Pt with decreased foot clearance and decreased hip flexion bilaterally.  Tends to hip hike and lean with trunk to assist with clearance/advancement.  Pt with several episodes of needing physical assist to advance LE's.    Pt performed sidestepping approx. 30' x 2 reps across pool with use of noodle for UE support    Pt performed bil. Hip flexion then hamstring curls x 5 reps x 3 sets in standing with UE support on pool edge.  Pt having significant tone/spasticity in LE's and at times unable to "break" tone to unlock knees for exercises.  Pt fatigued quickly with exercises therefore decreased  reps and allowed rest in between.   Squats x 10 reps then repeated with RLE only then LLE only x 10 reps each    Exercises performed in supine position (supported by PTA with use of noodle under pt's arms) - Closed chain hip and knee extension by pt placing feet on pool wall - 15 reps.  Had additional PT present who assisted with helping pt to place his feet on wall along with assisting with knee flexion.   Attempted bicycling of LE's but pt with decreased ability to perform hip flexion due to spasticity.  Attempted hip abduction but pt initially unable to move his legs into abduction at all.  While in supine position placed bil LE's on white pool noodle (one at knee and another at ankle) and PTA holding pt behind back with pool noodle, worked on floatation/relaxation with PTA moving pt side<>side x >30 reps.  Pt then able to perform 10 reps of hips abd/add.  Pt has difficulty keeping LE's and hips near top of water without noodle underneath.  Pt was able to return self from supine to standing with min assist x 1 rep and CGA x 1 rep   Pt requires buoyancy for support with balance and viscosity for resistance for strengthening exercises         PT Short Term Goals - 03/24/19 [id number]      PT SHORT TERM GOAL #1   Title  Patient will demonstrate independence with updated HEP    Time  4    Period  Weeks    Status  Deferred    Target Date  03/20/19   target date extended due to pt on hold due to DVT in LUE     PT SHORT TERM GOAL #2   Title  Pt will improve gait velocity with RW to >/= 1.0 ft/sec with decreased use of UE to lift and swing RLE through    Baseline  .62 ft/sec with RW/AFO    Time  4    Period  Weeks    Status  Deferred    Target Date  03/20/19      PT SHORT TERM GOAL #3   Title  Pt will improve BERG to >/= 33/56    Baseline  29/56    Time  4    Period  Weeks    Status  Deferred    Target Date  03/20/19      PT SHORT TERM GOAL #4   Title  Pt will ambulate x 230' over  indoor surfaces with RW MOD I with improved R hip and knee flexion and foot clearance without lifting through UE    Time  4    Period  Weeks    Status  Deferred    Target Date  03/20/19  PT SHORT TERM GOAL #5   Title  Pt will negotate 8 stairs with two rails step to sequence with supervision    Time  4    Period  Weeks    Status  Deferred    Target Date  03/20/19        PT Long Term Goals - 03/24/19 [id number]      PT LONG TERM GOAL #1   Title  Pt will demonstrate independence with land and aquatic HEP and will report going to [facility] 1-2x/week for ongoing fitness    Time  8    Period  Weeks    Status  New      PT LONG TERM GOAL #2   Title  Pt will improve gait velocity with RW to >/= 1.8 ft/sec    Time  8    Period  Weeks    Status  New      PT LONG TERM GOAL #3   Title  Pt will improve BERG to >/= 37/56 to decrease falls risk    Time  8    Period  Weeks    Status  New      PT LONG TERM GOAL #4   Title  Pt will ambulate x 200' outside over pavement and grass with supervision and LRAD to improve safety ambulating to wood working shop at home    Time  8    Period  Weeks    Status  New      PT LONG TERM GOAL #5   Title  Pt will negotiate 8 stairs with 2 rails with alternating sequence with supervision    Time  8    Period  Weeks    Status  New            Plan - 04/13/19 1828    Clinical Impression Statement  Today was pt's first  Aquatic therapy session since 06/2018.  Session focused on bil LE flexibility, tone reduction, strengthening, balance, core stabilization and gait training with use of buoyancy of water for support and use of viscosity of water for resistance.  Pt requires frequent rest breaks during session due to muscle fatigue but works hard during session.  With rest breaks, pt's muscle recruitment improves for the next several reps.    Personal Factors and Comorbidities  Comorbidity 3+;Past/Current Experience    Comorbidities  multiple falls, vitamin D  insufficiency, Type 1 IDDM, diabetic retinopathy, hypothyroidism, HTN, hyperlipidemia, R tibia fracture, L ankle fracture with ORIF    Examination-Activity Limitations  Carry;Locomotion Level;Stairs;Stand;Transfers;Squat    Examination-Participation Restrictions  Community Activity;Driving;Yard Work    [employer]  Evolving/Moderate complexity    Rehab Potential  Good    PT Frequency  2x / week    PT Duration  8 weeks    PT Treatment/Interventions  ADLs/Self Care Home Management;Aquatic Therapy;Electrical Stimulation;DME Instruction;Gait training;Stair training;Functional mobility training;Therapeutic activities;Therapeutic exercise;Balance training;Neuromuscular re-education;Patient/family education;Orthotic Fit/Training;Passive range of motion    PT Next Visit Plan  use of Bioness on hamstrings for knee flexion/LE strengthening (prone ex's);  Endurance on [employer].  Continue to focus on core/postural control training without UE support, hip flexion and knee flexion strengthening, ankle DF strength - sitting on therapy ball.  SLS and weight shift training (rockerboard).    Consulted and Agree with Plan of Care  Patient       Patient will benefit from skilled therapeutic intervention in order to improve the following deficits and impairments:  Abnormal gait,  Decreased balance, Decreased endurance, Decreased coordination, Decreased range of motion, Decreased strength, Difficulty walking, Impaired tone, Postural dysfunction  Visit Diagnosis: Muscle weakness (generalized)  Unsteadiness on feet  Other abnormalities of gait and mobility  Repeated falls  Foot drop, right  Foot drop, left     Problem List Patient Active Problem List   Diagnosis Date Noted  . Vitamin D insufficiency 11/29/2016  . Hypogonadism in male 11/01/2016  . Type 1 diabetes mellitus ([place])   . Hypothyroidism   . Hypertension   . Hyperlipidemia   . Closed fracture of right fibula and tibia  10/29/2016  . Closed tibia fracture 10/29/2016  . Closed fracture of right tibial plafond with fibula involvement 10/29/2016  . Abnormal liver function tests 03/06/2013  . RASH AND OTHER NONSPECIFIC SKIN ERUPTION 10/07/2009  . SHOULDER PAIN, LEFT 12/27/2008  . Type I (juvenile type) diabetes mellitus without mention of complication, uncontrolled 12/03/2007  . HYPOTHYROIDISM 11/12/2007  . HYPERCHOLESTEROLEMIA 11/12/2007  . MULTIPLE SCLEROSIS 08/29/2007  . PROLIFERATIVE DIABETIC RETINOPATHY 08/29/2007  . HYPERTENSION 12/25/2006    Narda Bonds, PTA [facility] 04/13/19 6:35 PM Phone: [phone] Fax: [phone]   Addendum for 10th visit PN added by primary PT: [name], PT, DPT 04/14/19    4:09 PM     [facility] [address] [place], [place], [address] Phone: [phone]   Fax:  [phone]  Name: Johnathan Ross MRN: [id number] Date of Birth: August 08, 1970

## 2019-04-15 ENCOUNTER — Ambulatory Visit: Payer: Medicare Other | Admitting: Physical Therapy

## 2019-04-17 ENCOUNTER — Other Ambulatory Visit: Payer: Self-pay

## 2019-04-17 ENCOUNTER — Ambulatory Visit: Payer: Medicare Other | Admitting: Physical Therapy

## 2019-04-17 ENCOUNTER — Encounter: Payer: Self-pay | Admitting: Physical Therapy

## 2019-04-17 DIAGNOSIS — M21372 Foot drop, left foot: Secondary | ICD-10-CM

## 2019-04-17 DIAGNOSIS — R2681 Unsteadiness on feet: Secondary | ICD-10-CM | POA: Diagnosis not present

## 2019-04-17 DIAGNOSIS — R296 Repeated falls: Secondary | ICD-10-CM | POA: Diagnosis not present

## 2019-04-17 DIAGNOSIS — R262 Difficulty in walking, not elsewhere classified: Secondary | ICD-10-CM

## 2019-04-17 DIAGNOSIS — R2689 Other abnormalities of gait and mobility: Secondary | ICD-10-CM

## 2019-04-17 DIAGNOSIS — M21371 Foot drop, right foot: Secondary | ICD-10-CM | POA: Diagnosis not present

## 2019-04-17 DIAGNOSIS — M6281 Muscle weakness (generalized): Secondary | ICD-10-CM | POA: Diagnosis not present

## 2019-04-18 ENCOUNTER — Other Ambulatory Visit: Payer: Self-pay | Admitting: Endocrinology

## 2019-04-19 NOTE — Therapy (Signed)
[facility] [address] [place], [place], [address] Phone: [phone]   Fax:  [phone]  Physical Therapy Treatment  Patient Details  Name: Johnathan Ross MRN: [id number] Date of Birth: Jun 22, 1970 Referring Provider (PT): Carylon Perches, MD   Encounter Date: 04/17/2019     04/17/19 1009  PT Visits / Re-Eval  Visit Number 11  Number of Visits 35  Date for PT Re-Evaluation 07/02/19  Authorization  Authorization Type Medicare Part A and B; 10th visit PN  PT Time Calculation  PT Start Time 0803  PT Stop Time 0850  PT Time Calculation (min) 47 min  PT - End of Session  Equipment Utilized During Treatment Other (comment) (Bioness)  Activity Tolerance Patient limited by fatigue  Behavior During Therapy [facility] for tasks assessed/performed        Past Medical History:  Diagnosis Date  . Hyperlipidemia   . Hypertension   . Hypogonadism in male 11/01/2016  . Hypothyroidism   . Multiple sclerosis ([place])   . 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 Type 1 diabetes mellitus ([place]) dx'd 1994  . Vitamin D insufficiency 11/29/2016    Past Surgical History:  Procedure Laterality Date  . EYE SURGERY Bilateral    "laser for diabetic retinopathy"  . FRACTURE SURGERY    . OPEN REDUCTION INTERNAL FIXATION (ORIF) TIBIA/FIBULA FRACTURE Right 10/30/2016  . ORIF ANKLE FRACTURE Left 2015  . ORIF TIBIA FRACTURE Right 10/30/2016   Procedure: OPEN REDUCTION INTERNAL FIXATION (ORIF) TIBIA FIBULA FRACTURE;  Surgeon: Altamese Bayside, MD;  Location: [facility];  Service: Orthopedics;  Laterality: Right;  . RETINAL LASER PROCEDURE Bilateral    "for diabetic retinopathy"    There were no vitals filed for this visit.  Subjective Assessment - 04/19/19 0921    Subjective  Was very sore in his shoulders after aquatic; wasn't sure he was going to go back but after being sore he realized it was probably working.  Did not wear AFO again  due to anticipating using Bioness today.  Pt ambulating with significant increased use of UE, pt lifting entire body and hopping LE forwards; not able to advance RLE.    Pertinent History  multiple falls, vitamin D insufficiency, Type 1 IDDM, diabetic retinopathy, hypothyroidism, HTN, hyperlipidemia, R tibia fracture, L ankle fracture with ORIF    Limitations  Walking    Diagnostic tests  No new lesions seen on MRI    Patient Stated Goals  To improve walking    Currently in Pain?  No/denies         [facility] PT Assessment - 04/19/19 [id number]      Assessment   Medical Diagnosis  MS    Referring Provider (PT)  Carylon Perches, MD    Onset Date/Surgical Date  12/15/18    Hand Dominance  Right    Prior Therapy  yes, outpatient PT following fall with ankle fracture      Precautions   Precautions  Other (comment)    Precaution Comments  multiple falls, vitamin D insufficiency, Type 1 IDDM, diabetic retinopathy, hypothyroidism, HTN, hyperlipidemia, R tibia fracture, L ankle fracture with ORIF    Required Braces or Orthoses  Other Brace/Splint    Other Brace/Splint  AFO RLE      Observation/Other Assessments   Focus on Therapeutic Outcomes (FOTO)   N/A      Ambulation/Gait   Ambulation/Gait  Yes    Ambulation/Gait Assistance  3: Mod assist    Ambulation/Gait Assistance  Details  with RW and Bioness in gait mode with therapist making adjustments to improve timing and sequencing of mm activation and to decrease extensor tone.  During swing phase, despite DF activation with Bioness pt noted to have increased activation of gastroc muscle into PF.  Increased difficulty with hip flexion today.  Performed gait x 3 sets with Bioness with therapist providing facilitation of hip flexion and closed chain anterior tibial translation in stance phase to prevent genu recurvatum and allow COG to advance forwards over R stance LE; continued to present with increased foot/toe drag and would intermittently lift his  whole body with bilat UE.  Also added heel wedge to shoe for passive DF and knee flexion moment but without improvement in gait sequence.     Ambulation Distance (Feet)  75 Feet   x 3    Assistive device  Rolling walker    Gait Pattern  Step-to pattern;Decreased step length - right;Decreased step length - left;Decreased stride length;Decreased hip/knee flexion - right;Decreased hip/knee flexion - left;Decreased dorsiflexion - right;Decreased dorsiflexion - left;Right genu recurvatum;Poor foot clearance - left;Poor foot clearance - right    Ambulation Surface  Level;Indoor    Stairs  No    Gait Comments  stairs not tested today due to significant difficulty with ambulation      Standardized Balance Assessment   10 Meter Walk  1:13 with multiple standing rest breaks - using RW and Bioness due to pt not having AFO today.  See gait section for details about gait.  Marland Kitchen44 ft/sec but with 5-6 standing rest breaks, time paused      [employer] comment:  Not tested today due to significant difficulty with ambulation and decline in function                   [facility] Adult PT Treatment/Exercise - 04/19/19 [id number]      Neuro Re-ed    Neuro Re-ed Details   NMR with Bioness to facilitate hip and knee flexion for advancement during gait: standing at steps with Bioness in training mode 10 seconds on, 10 seconds off - performed RLE advancement to first step with anterior lean and weight shift onto RLE.  Progressed to lifting RLE up to first step and then pushing through to advance LLE to next step without locking R knee into genu recurvatum.  Therapist made adjustments to improve timing and sequencing of mm activation and to prevent extensor tone.  Also performed gait with Bioness in gait mode - described above.  Min A at stairs to assist with fully lifting and clearing RLE; performed 3 sets x 10 reps.        Modalities   Modalities  Buyer, retail Stimulation Location  R anterior tibialis, R hamstring mm groups    Electrical Stimulation Action  open and closed chain ankle DF, hip extension and knee flexion    Electrical Stimulation Parameters  parameters adjusted in Tablet 1; steering electrodes    Electrical Stimulation Goals  Strength;Neuromuscular facilitation             PT Education - 04/19/19 [phone]    Education Details  will continue to monitor ambulation, if continues to decline will video and call neurologist to discuss Botox injections.  Will need to provide pt with external support to prevent overuse of UE and reoccurence of UE DVT    Person(s) Educated  Patient  Methods  Explanation    Comprehension  Verbalized understanding       PT Short Term Goals - 04/19/19 0930      PT SHORT TERM GOAL #1   Title  Patient will demonstrate independence with updated HEP    Time  4    Period  Weeks    Status  Revised    Target Date  05/01/19   target date extended due to pt on hold due to DVT in LUE     PT SHORT TERM GOAL #2   Title  Pt will improve gait velocity with RW to >/= 0.8 ft/sec with decreased use of UE to lift and swing RLE through    Baseline  declined to .44 ft/sec following Botox    Time  4    Period  Weeks    Status  Revised    Target Date  05/01/19      PT SHORT TERM GOAL #3   Title  Pt will improve BERG to >/= 33/56    Baseline  29/56    Time  4    Period  Weeks    Status  Revised    Target Date  05/01/19      PT SHORT TERM GOAL #4   Title  Pt will ambulate x 100' over indoor surfaces with RW and Bioness or AFO MOD I with 2 standing rest breaks and improved R hip and knee flexion and foot clearance without lifting through UE    Baseline  5-6 standing rest breaks to ambulate x 40' with RW and Bioness    Time  4    Period  Weeks    Status  Revised    Target Date  05/01/19      PT SHORT TERM GOAL #5   Title  Pt will negotate 8 stairs with two rails step to sequence with supervision     Time  4    Period  Weeks    Status  Revised    Target Date  05/01/19        PT Long Term Goals - 04/19/19 0936      PT LONG TERM GOAL #1   Title  Pt will demonstrate independence with land and aquatic HEP and will report going to [facility] 1-2x/week for ongoing fitness  (ALL LTG set for 90 days; after 12/4 please revise STG to 60 days out - 07/02/2019)    Time  12    Period  Weeks    Status  New    Target Date  07/02/19      PT LONG TERM GOAL #2   Title  Pt will improve gait velocity with RW to >/= 1.8 ft/sec    Time  12    Period  Weeks    Status  Revised      PT LONG TERM GOAL #3   Title  Pt will improve BERG to >/= 37/56 to decrease falls risk    Time  12    Period  Weeks    Status  Revised      PT LONG TERM GOAL #4   Title  Pt will ambulate x 200' outside over pavement and grass with supervision and LRAD to improve safety ambulating to wood working shop at home    Time  12    Period  Weeks    Status  Revised      PT LONG TERM GOAL #5   Title  Pt will negotiate  8 stairs with 2 rails with alternating sequence with supervision    Time  12    Period  Weeks    Status  Revised            Plan - 04/19/19 0924    Clinical Impression Statement  Unable to fully assess LTG - pt has experienced signficant decline since this evaluation.  Pt's UE DVT has resolved but pt demonstrates significant increased use of UE for ambulation following BOTOX injections.  Pt demonstrating decreased ability to initiate hip and knee flexion, increased extensor tone and has to lift self with UE and hop forwards to safely ambulate due to significant R foot drag.  Pt has initiated aquatic therapy but also demonstrates increased reliance on UE in aquatic environment.  Bioness today provided minimal improvement in gait sequence.  If diffculty continues therapy may have to consider temporary bracing to improve safety and efficiency of gait and will discuss results of Botox with neurology.  Pt will  benefit from continued land and aquatic therapy to address impairments, to maximize functional mobility independence and decrease falls risk.    Personal Factors and Comorbidities  Comorbidity 3+;Past/Current Experience    Comorbidities  multiple falls, vitamin D insufficiency, Type 1 IDDM, diabetic retinopathy, hypothyroidism, HTN, hyperlipidemia, R tibia fracture, L ankle fracture with ORIF    Examination-Activity Limitations  Carry;Locomotion Level;Stairs;Stand;Transfers;Squat    Examination-Participation Restrictions  Community Activity;Driving;Yard Work    [employer] Potential  Fair    PT Frequency  2x / week    PT Duration  12 weeks    PT Treatment/Interventions  ADLs/Self Care Home Management;Aquatic Therapy;Electrical Stimulation;DME Instruction;Gait training;Stair training;Functional mobility training;Therapeutic activities;Therapeutic exercise;Balance training;Neuromuscular re-education;Patient/family education;Orthotic Fit/Training;Passive range of motion;Energy conservation    PT Next Visit Plan  Still having a hard time walking?  Video walk and send to neurology.  Cancel once appointment next week.  Swedish knee cage?  Needs to wear AFO.  use of Bioness on hamstrings for knee flexion/LE strengthening (prone ex's);  Endurance on [employer].  Continue to focus on core/postural control training without UE support, hip flexion and knee flexion strengthening, ankle DF strength - sitting on therapy ball.  SLS and weight shift training (rockerboard).    Consulted and Agree with Plan of Care  Patient       Patient will benefit from skilled therapeutic intervention in order to improve the following deficits and impairments:  Abnormal gait, Decreased balance, Decreased endurance, Decreased coordination, Decreased range of motion, Decreased strength, Difficulty walking, Impaired tone, Postural dysfunction, Decreased activity tolerance  Visit Diagnosis: Muscle weakness (generalized)  Unsteadiness on  feet  Other abnormalities of gait and mobility  Repeated falls  Foot drop, right  Foot drop, left  Difficulty in walking, not elsewhere classified     Problem List Patient Active Problem List   Diagnosis Date Noted  . Vitamin D insufficiency 11/29/2016  . Hypogonadism in male 11/01/2016  . Type 1 diabetes mellitus ([place])   . Hypothyroidism   . Hypertension   . Hyperlipidemia   . Closed fracture of right fibula and tibia 10/29/2016  . Closed tibia fracture 10/29/2016  . Closed fracture of right tibial plafond with fibula involvement 10/29/2016  . Abnormal liver function tests 03/06/2013  . RASH AND OTHER NONSPECIFIC SKIN ERUPTION 10/07/2009  . SHOULDER PAIN, LEFT 12/27/2008  . Type I (juvenile type) diabetes mellitus without mention of complication, uncontrolled 12/03/2007  . HYPOTHYROIDISM 11/12/2007  . HYPERCHOLESTEROLEMIA 11/12/2007  . MULTIPLE SCLEROSIS 08/29/2007  .  PROLIFERATIVE DIABETIC RETINOPATHY 08/29/2007  . HYPERTENSION 12/25/2006    [name], PT, DPT 04/19/19    9:40 AM    [facility] [address] [place], [place], [address] Phone: [phone]   Fax:  [phone]  Name: Johnathan Ross MRN: [id number] Date of Birth: 11-08-1970

## 2019-04-20 ENCOUNTER — Ambulatory Visit: Payer: Medicare Other | Admitting: Physical Therapy

## 2019-04-20 ENCOUNTER — Other Ambulatory Visit: Payer: Self-pay

## 2019-04-20 ENCOUNTER — Encounter: Payer: Self-pay | Admitting: Physical Therapy

## 2019-04-20 DIAGNOSIS — R2681 Unsteadiness on feet: Secondary | ICD-10-CM

## 2019-04-20 DIAGNOSIS — M21372 Foot drop, left foot: Secondary | ICD-10-CM | POA: Diagnosis not present

## 2019-04-20 DIAGNOSIS — R2689 Other abnormalities of gait and mobility: Secondary | ICD-10-CM | POA: Diagnosis not present

## 2019-04-20 DIAGNOSIS — M6281 Muscle weakness (generalized): Secondary | ICD-10-CM | POA: Diagnosis not present

## 2019-04-20 DIAGNOSIS — R296 Repeated falls: Secondary | ICD-10-CM | POA: Diagnosis not present

## 2019-04-20 DIAGNOSIS — M21371 Foot drop, right foot: Secondary | ICD-10-CM | POA: Diagnosis not present

## 2019-04-20 NOTE — Therapy (Signed)
[facility] [address] [place], [place], [address] Phone: [phone]   Fax:  [phone]  Physical Therapy Treatment  Patient Details  Name: Johnathan Ross MRN: [id number] Date of Birth: December 03, 1970 Referring Provider (PT): Carylon Perches, MD   Encounter Date: 04/20/2019  PT End of Session - 04/20/19 1947    Visit Number  12    Number of Visits  35    Date for PT Re-Evaluation  07/02/19    Authorization Type  Medicare Part A and B; 10th visit PN    PT Start Time  1330    PT Stop Time  1430    PT Time Calculation (min)  60 min    Equipment Utilized During Treatment  Other (comment)   pool noodle and flotation belt   Activity Tolerance  Other (comment)   limited by spasticity   Behavior During Therapy  [facility] for tasks assessed/performed       Past Medical History:  Diagnosis Date  . Hyperlipidemia   . Hypertension   . Hypogonadism in male 11/01/2016  . Hypothyroidism   . Multiple sclerosis ([place])   . 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 Type 1 diabetes mellitus ([place]) dx'd 1994  . Vitamin D insufficiency 11/29/2016    Past Surgical History:  Procedure Laterality Date  . EYE SURGERY Bilateral    "laser for diabetic retinopathy"  . FRACTURE SURGERY    . OPEN REDUCTION INTERNAL FIXATION (ORIF) TIBIA/FIBULA FRACTURE Right 10/30/2016  . ORIF ANKLE FRACTURE Left 2015  . ORIF TIBIA FRACTURE Right 10/30/2016   Procedure: OPEN REDUCTION INTERNAL FIXATION (ORIF) TIBIA FIBULA FRACTURE;  Surgeon: Altamese Palestine, MD;  Location: [facility];  Service: Orthopedics;  Laterality: Right;  . RETINAL LASER PROCEDURE Bilateral    "for diabetic retinopathy"    There were no vitals filed for this visit.  Subjective Assessment - 04/20/19 1939    Subjective  Pt presents for aquatic therapy at [facility] - states he really felt "it in my knees" after first aquatic therapy session since resuming pool exercise from Feb. this year  (prior to Covid)    Pertinent History  multiple falls, vitamin D insufficiency, Type 1 IDDM, diabetic retinopathy, hypothyroidism, HTN, hyperlipidemia, R tibia fracture, L ankle fracture with ORIF    Limitations  Walking    Diagnostic tests  No new lesions seen on MRI    Patient Stated Goals  To improve walking    Currently in Pain?  No/denies        Aquatic therapy at [facility] - pool temp 87.2 degrees  Patient seen for aquatic therapy today.  Treatment took place in water 2.5-4 feet deep depending upon activity.  Pt entered and exited the pool via step negotiation step by step sequence with use of bil. hand rails with SBA: min assist with step ascension due to  fatigue when pt was exiting pool at end of session.   Pt performed gait training in pool with bil. UE support on pool noodle stabilized by PT - forwards 48m x 1 rep with pt frequently stopping  due to knee hyperextension, Lt knee > Rt knee;  Pt performed sidestepping 22m x 1 rep with bil. UE support on pool noodle; frequent stopping to allow knees to flex from hyperextended position   Squats 10 reps with pt holding onto side of pool  Pt performed stepping activity with RLE/LLE to fascilitate knee control on each leg - pt stepped forward/back 10 reps with each  leg while attempting to  Maintain stance leg in flexed position   Pt in supine position - flotation belt used for increased buoyancy and flotation; pt performed hip abduction/adduction after stretching of hip adductors - 8 reps prior to fatigue; bicycling legs was attempted but difficult for pt to perform due to difficulty initiating flexion of hips & knees  Pt placed both feet on pool wall (still in supine position) and was assisted with hip and knee flexion by PT pushing pt toward wall while feet were stabilized on wall - pt pushed away from wall for closed chain strengthening 10 reps with assist of PT  Pt held bar in pool - amb. Approx. 40' forward in crouched position to  fascilitate knee flexion in swing through phase of gait  Pt sat on bench in pool - performed LAQ 10 reps each leg with assistance for flexion; seated marching 10 reps each leg Tall kneeling with UE support on bench - partial squats back onto heels 10 reps; moving each leg forward/back 10 reps, maintaining knee flexion   Pt requires buoyancy for support with balance and viscosity for resistance for strengthening exercises; buoyancy is also needed for off loading his body to assist with initiation of hip and knee flexion;  Buoyancy of water also provides support of body in supine for spasticity reduction                       PT Short Term Goals - 04/20/19 1955      PT SHORT TERM GOAL #1   Title  Patient will demonstrate independence with updated HEP    Time  4    Period  Weeks    Status  Revised    Target Date  05/01/19   target date extended due to pt on hold due to DVT in LUE     PT SHORT TERM GOAL #2   Title  Pt will improve gait velocity with RW to >/= 0.8 ft/sec with decreased use of UE to lift and swing RLE through    Baseline  declined to .44 ft/sec following Botox    Time  4    Period  Weeks    Status  Revised    Target Date  05/01/19      PT SHORT TERM GOAL #3   Title  Pt will improve BERG to >/= 33/56    Baseline  29/56    Time  4    Period  Weeks    Status  Revised    Target Date  05/01/19      PT SHORT TERM GOAL #4   Title  Pt will ambulate x 100' over indoor surfaces with RW and Bioness or AFO MOD I with 2 standing rest breaks and improved R hip and knee flexion and foot clearance without lifting through UE    Baseline  5-6 standing rest breaks to ambulate x 40' with RW and Bioness    Time  4    Period  Weeks    Status  Revised    Target Date  05/01/19      PT SHORT TERM GOAL #5   Title  Pt will negotate 8 stairs with two rails step to sequence with supervision    Time  4    Period  Weeks    Status  Revised    Target Date  05/01/19         PT Long Term Goals - 04/20/19 1957  PT LONG TERM GOAL #1   Title  Pt will demonstrate independence with land and aquatic HEP and will report going to [facility] 1-2x/week for ongoing fitness  (ALL LTG set for 90 days; after 12/4 please revise STG to 60 days out - 07/02/2019)    Time  12    Period  Weeks    Status  New      PT LONG TERM GOAL #2   Title  Pt will improve gait velocity with RW to >/= 1.8 ft/sec    Time  12    Period  Weeks    Status  Revised      PT LONG TERM GOAL #3   Title  Pt will improve BERG to >/= 37/56 to decrease falls risk    Time  12    Period  Weeks    Status  Revised      PT LONG TERM GOAL #4   Title  Pt will ambulate x 200' outside over pavement and grass with supervision and LRAD to improve safety ambulating to wood working shop at home    Time  12    Period  Weeks    Status  Revised      PT LONG TERM GOAL #5   Title  Pt will negotiate 8 stairs with 2 rails with alternating sequence with supervision    Time  12    Period  Weeks    Status  Revised            Plan - 04/20/19 2002    Clinical Impression Statement  Pt continues to have significant extensor tone/spasticity in LE's in aquatic exercise.  Pt does not wear AFO to aquatic therapy session - ambulates with RW with swing through gait pattern as pt heavily weight bears on arms to lift body and advance LE's.  Pt performed gait training in pool with use of noodle stabilized by PT for assistance with balance - pt amb. 76m forward length of pool, then sidestepped 66m with bil. UE support on noodle - this activity (forward amb./sidestepping 78m x 1 rep each) took 25" due to very frequent standing breaks for pt to bend knees due to hyperextension, with pt frequently reporting "my knees are locked out".  Increased AROM for hip abdct/adduction for approx. 8 reps noted after stretching hip adductors with pt in supine position.    PT Treatment/Interventions  ADLs/Self Care Home Management;Aquatic  Therapy;Electrical Stimulation;DME Instruction;Gait training;Stair training;Functional mobility training;Therapeutic activities;Therapeutic exercise;Balance training;Neuromuscular re-education;Patient/family education;Orthotic Fit/Training;Passive range of motion;Energy conservation    PT Next Visit Plan  Still having a hard time walking?  Video walk and send to neurology.  Cancel once appointment next week.  Swedish knee cage?  Needs to wear AFO.  use of Bioness on hamstrings for knee flexion/LE strengthening (prone ex's);  Endurance on [employer].  Continue to focus on core/postural control training without UE support, hip flexion and knee flexion strengthening, ankle DF strength - sitting on therapy ball.  SLS and weight shift training (rockerboard).    Consulted and Agree with Plan of Care  Patient       Patient will benefit from skilled therapeutic intervention in order to improve the following deficits and impairments:     Visit Diagnosis: Muscle weakness (generalized)  Other abnormalities of gait and mobility  Unsteadiness on feet     Problem List Patient Active Problem List   Diagnosis Date Noted  . Vitamin D insufficiency 11/29/2016  . Hypogonadism in male  11/01/2016  . Type 1 diabetes mellitus ([place])   . Hypothyroidism   . Hypertension   . Hyperlipidemia   . Closed fracture of right fibula and tibia 10/29/2016  . Closed tibia fracture 10/29/2016  . Closed fracture of right tibial plafond with fibula involvement 10/29/2016  . Abnormal liver function tests 03/06/2013  . RASH AND OTHER NONSPECIFIC SKIN ERUPTION 10/07/2009  . SHOULDER PAIN, LEFT 12/27/2008  . Type I (juvenile type) diabetes mellitus without mention of complication, uncontrolled 12/03/2007  . HYPOTHYROIDISM 11/12/2007  . HYPERCHOLESTEROLEMIA 11/12/2007  . MULTIPLE SCLEROSIS 08/29/2007  . PROLIFERATIVE DIABETIC RETINOPATHY 08/29/2007  . HYPERTENSION 12/25/2006    [place], [place], [place] 04/20/2019,  8:13 PM  [facility] [address] [place], [place], [address] Phone: [phone]   Fax:  [phone]  Name: JESEAN WICHERT MRN: [id number] Date of Birth: 12-28-1970

## 2019-04-22 ENCOUNTER — Ambulatory Visit: Payer: Medicare Other | Admitting: Physical Therapy

## 2019-04-22 ENCOUNTER — Other Ambulatory Visit: Payer: Self-pay

## 2019-04-22 DIAGNOSIS — M21372 Foot drop, left foot: Secondary | ICD-10-CM | POA: Diagnosis not present

## 2019-04-22 DIAGNOSIS — R296 Repeated falls: Secondary | ICD-10-CM | POA: Diagnosis not present

## 2019-04-22 DIAGNOSIS — R2681 Unsteadiness on feet: Secondary | ICD-10-CM | POA: Diagnosis not present

## 2019-04-22 DIAGNOSIS — M21371 Foot drop, right foot: Secondary | ICD-10-CM

## 2019-04-22 DIAGNOSIS — R2689 Other abnormalities of gait and mobility: Secondary | ICD-10-CM

## 2019-04-22 DIAGNOSIS — M6281 Muscle weakness (generalized): Secondary | ICD-10-CM | POA: Diagnosis not present

## 2019-04-22 NOTE — Therapy (Signed)
[facility] [address] [place], [place], [address] Phone: [phone]   Fax:  [phone]  Physical Therapy Treatment  Patient Details  Name: Johnathan Ross MRN: [id number] Date of Birth: 22-Aug-1970 Referring Provider (PT): Carylon Perches, MD   Encounter Date: 04/22/2019  PT End of Session - 04/22/19 1930    Visit Number  13    Number of Visits  35    Date for PT Re-Evaluation  07/02/19    Authorization Type  Medicare Part A and B; 10th visit PN    PT Start Time  0935    PT Stop Time  1024    PT Time Calculation (min)  49 min    Equipment Utilized During Treatment  Other (comment)   Bioness, swedish knee cage   Activity Tolerance  Other (comment)   limited by spasticity   Behavior During Therapy  [facility] for tasks assessed/performed       Past Medical History:  Diagnosis Date  . Hyperlipidemia   . Hypertension   . Hypogonadism in male 11/01/2016  . Hypothyroidism   . Multiple sclerosis ([place])   . 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 Type 1 diabetes mellitus ([place]) dx'd 1994  . Vitamin D insufficiency 11/29/2016    Past Surgical History:  Procedure Laterality Date  . EYE SURGERY Bilateral    "laser for diabetic retinopathy"  . FRACTURE SURGERY    . OPEN REDUCTION INTERNAL FIXATION (ORIF) TIBIA/FIBULA FRACTURE Right 10/30/2016  . ORIF ANKLE FRACTURE Left 2015  . ORIF TIBIA FRACTURE Right 10/30/2016   Procedure: OPEN REDUCTION INTERNAL FIXATION (ORIF) TIBIA FIBULA FRACTURE;  Surgeon: Altamese Beaver, MD;  Location: [facility];  Service: Orthopedics;  Laterality: Right;  . RETINAL LASER PROCEDURE Bilateral    "for diabetic retinopathy"    There were no vitals filed for this visit.  Subjective Assessment - 04/22/19 1913    Subjective  Had a good session at aquatic therapy; LE still feel very weak - still having to lift self with UE on rolling walker    Pertinent History  multiple falls, vitamin D  insufficiency, Type 1 IDDM, diabetic retinopathy, hypothyroidism, HTN, hyperlipidemia, R tibia fracture, L ankle fracture with ORIF    Limitations  Walking    Diagnostic tests  No new lesions seen on MRI    Patient Stated Goals  To improve walking                       [facility] Adult PT Treatment/Exercise - 04/22/19 1913      Ambulation/Gait   Ambulation/Gait  Yes    Ambulation/Gait Assistance  3: Mod assist    Ambulation/Gait Assistance Details  first without Bioness or Swedish knee cage to allow therapist to use clinic IPAD to video patient's gait in order to send to neurologist and discuss effects of Botox injection.  Donned Bioness to R anterior tib and swedish knee cage to R knee to provide support and minimize genu recurvatum to decrease hypertonicity in gastroc and hamstring muscle groups.  Even with knee cage, Bioness and therapist's facilitation pt continue to demonstrate increased plantarflexion and hip extension tone limiting patient's ability to advance with hip and knee flexion or advance tibia in stance requring pt to lift and hop with UE on RW.  Following ankle stretches pt demonstrated minimal improvement but was able to take 2-3 more steps forwards activating hip and knee flexion during swing but with fatigue pt returned to  hops with UE    Ambulation Distance (Feet)  75 Feet    Assistive device  Rolling walker;Other (Comment)   Bioness, swedish knee cage   Gait Pattern  Step-to pattern;Decreased step length - right;Decreased step length - left;Decreased hip/knee flexion - right;Decreased dorsiflexion - right;Right genu recurvatum;Poor foot clearance - right    Ambulation Surface  Level;Indoor      Exercises   Exercises  Other Exercises    Other Exercises   With Bioness in training mode performed R gastroc stretching to address increased hypertonicity and spasticity.  Standing on step performed R heel drop downs x 10 second hold x 10 reps.  Transitioned to standing  with R foot on slanted wedge and performing sustained squats with hips over to R with 10 second hold x 10 reps with therapist on R side providing support/stability to R knee due to quad weakness and fatigue      Modalities   Modalities  Electrical Stimulation      Electrical Stimulation   Electrical Stimulation Location  R anterior tibialis    Electrical Stimulation Action  open and closed chain ankle DF; thigh cuff not used today due to trial of swedish knee cage    Electrical Stimulation Parameters  see Tablet 1; steering electrode    Electrical Stimulation Goals  Strength;Tone;Neuromuscular facilitation             PT Education - 04/22/19 1927    Education Details  discussed use of video of gait to send to neurologist due to significant decline - pt agreed.    Person(s) Educated  Patient    Methods  Explanation    Comprehension  Verbalized understanding       PT Short Term Goals - 04/20/19 1955      PT SHORT TERM GOAL #1   Title  Patient will demonstrate independence with updated HEP    Time  4    Period  Weeks    Status  Revised    Target Date  05/01/19   target date extended due to pt on hold due to DVT in LUE     PT SHORT TERM GOAL #2   Title  Pt will improve gait velocity with RW to >/= 0.8 ft/sec with decreased use of UE to lift and swing RLE through    Baseline  declined to .44 ft/sec following Botox    Time  4    Period  Weeks    Status  Revised    Target Date  05/01/19      PT SHORT TERM GOAL #3   Title  Pt will improve BERG to >/= 33/56    Baseline  29/56    Time  4    Period  Weeks    Status  Revised    Target Date  05/01/19      PT SHORT TERM GOAL #4   Title  Pt will ambulate x 100' over indoor surfaces with RW and Bioness or AFO MOD I with 2 standing rest breaks and improved R hip and knee flexion and foot clearance without lifting through UE    Baseline  5-6 standing rest breaks to ambulate x 40' with RW and Bioness    Time  4    Period  Weeks     Status  Revised    Target Date  05/01/19      PT SHORT TERM GOAL #5   Title  Pt will negotate 8 stairs with two rails  step to sequence with supervision    Time  4    Period  Weeks    Status  Revised    Target Date  05/01/19        PT Long Term Goals - 04/20/19 1957      PT LONG TERM GOAL #1   Title  Pt will demonstrate independence with land and aquatic HEP and will report going to [facility] 1-2x/week for ongoing fitness  (ALL LTG set for 90 days; after 12/4 please revise STG to 60 days out - 07/02/2019)    Time  12    Period  Weeks    Status  New      PT LONG TERM GOAL #2   Title  Pt will improve gait velocity with RW to >/= 1.8 ft/sec    Time  12    Period  Weeks    Status  Revised      PT LONG TERM GOAL #3   Title  Pt will improve BERG to >/= 37/56 to decrease falls risk    Time  12    Period  Weeks    Status  Revised      PT LONG TERM GOAL #4   Title  Pt will ambulate x 200' outside over pavement and grass with supervision and LRAD to improve safety ambulating to wood working shop at home    Time  12    Period  Weeks    Status  Revised      PT LONG TERM GOAL #5   Title  Pt will negotiate 8 stairs with 2 rails with alternating sequence with supervision    Time  12    Period  Weeks    Status  Revised            Plan - 04/22/19 1932    Clinical Impression Statement  Pt continues to have significant difficulty initiating R hip and knee flexion for gait and continues to require increased use of to lift his body and swing LE through even with use of functional electrical stimulation, swedish knee cage and therapist's facilitation.  Pt gave PT permission to video to send to neurologist; will contact neurology office to discuss due to pt recently recovering from UE DVT.  Performing botox in quad muscles may have limited pt's ability to overcome hamstring spasticity.  Will continue to progress as able based on patient' tolerance.    PT Treatment/Interventions  ADLs/Self  Care Home Management;Aquatic Therapy;Electrical Stimulation;DME Instruction;Gait training;Stair training;Functional mobility training;Therapeutic activities;Therapeutic exercise;Balance training;Neuromuscular re-education;Patient/family education;Orthotic Fit/Training;Passive range of motion;Energy conservation    PT Next Visit Plan  [facility] [place] - can you let Mckyle know that his appt with [name] on Tuesday is cancelled and I will see him on 12/3?  Needs to wear AFO.  use of Bioness on hamstrings for knee flexion/LE strengthening (prone ex's);  Endurance on [employer].  Continue to focus on core/postural control training without UE support, hip flexion and knee flexion strengthening, ankle DF strength - sitting on therapy ball.  SLS and weight shift training (rockerboard).    Recommended Other Services  I am going to contact neurology and send them a video of his gait; I think it is related to botox injection but I want to make sure    Consulted and Agree with Plan of Care  Patient       Patient will benefit from skilled therapeutic intervention in order to improve the following deficits and impairments:  Abnormal gait,  Decreased balance, Decreased endurance, Decreased coordination, Decreased range of motion, Decreased strength, Difficulty walking, Impaired tone, Postural dysfunction, Decreased activity tolerance  Visit Diagnosis: Other abnormalities of gait and mobility  Foot drop, right  Muscle weakness (generalized)  Unsteadiness on feet  Repeated falls     Problem List Patient Active Problem List   Diagnosis Date Noted  . Vitamin D insufficiency 11/29/2016  . Hypogonadism in male 11/01/2016  . Type 1 diabetes mellitus ([place])   . Hypothyroidism   . Hypertension   . Hyperlipidemia   . Closed fracture of right fibula and tibia 10/29/2016  . Closed tibia fracture 10/29/2016  . Closed fracture of right tibial plafond with fibula involvement 10/29/2016  . Abnormal liver function tests  03/06/2013  . RASH AND OTHER NONSPECIFIC SKIN ERUPTION 10/07/2009  . SHOULDER PAIN, LEFT 12/27/2008  . Type I (juvenile type) diabetes mellitus without mention of complication, uncontrolled 12/03/2007  . HYPOTHYROIDISM 11/12/2007  . HYPERCHOLESTEROLEMIA 11/12/2007  . MULTIPLE SCLEROSIS 08/29/2007  . PROLIFERATIVE DIABETIC RETINOPATHY 08/29/2007  . HYPERTENSION 12/25/2006    [name], PT, DPT 04/22/19    7:43 PM    [facility] [address] [place], [place], [address] Phone: [phone]   Fax:  [phone]  Name: Johnathan Ross MRN: [id number] Date of Birth: 21-Dec-1970

## 2019-04-27 ENCOUNTER — Encounter: Payer: Self-pay | Admitting: Physical Therapy

## 2019-04-27 ENCOUNTER — Telehealth: Payer: Self-pay

## 2019-04-27 ENCOUNTER — Other Ambulatory Visit: Payer: Self-pay

## 2019-04-27 ENCOUNTER — Ambulatory Visit: Payer: Medicare Other | Admitting: Physical Therapy

## 2019-04-27 DIAGNOSIS — R2689 Other abnormalities of gait and mobility: Secondary | ICD-10-CM

## 2019-04-27 DIAGNOSIS — M21371 Foot drop, right foot: Secondary | ICD-10-CM | POA: Diagnosis not present

## 2019-04-27 DIAGNOSIS — M6281 Muscle weakness (generalized): Secondary | ICD-10-CM | POA: Diagnosis not present

## 2019-04-27 DIAGNOSIS — R2681 Unsteadiness on feet: Secondary | ICD-10-CM | POA: Diagnosis not present

## 2019-04-27 DIAGNOSIS — M21372 Foot drop, left foot: Secondary | ICD-10-CM | POA: Diagnosis not present

## 2019-04-27 DIAGNOSIS — R296 Repeated falls: Secondary | ICD-10-CM | POA: Diagnosis not present

## 2019-04-27 NOTE — Telephone Encounter (Signed)
Yes, needs to call vein and vascular; their last note specifically said to call if he saw swelling because they would want to do some different testing than I am able to schedule for him.   Tel [phone]

## 2019-04-27 NOTE — Telephone Encounter (Signed)
Called both numbers and left messages for patient to contact [facility] and left phone numbers on both numbers.

## 2019-04-27 NOTE — Therapy (Signed)
[facility] [address] [place], [place], [address] Phone: [phone]   Fax:  [phone]  Physical Therapy Treatment  Patient Details  Name: Johnathan Ross MRN: [id number] Date of Birth: 05-24-1971 Referring Provider (PT): Carylon Perches, MD   Encounter Date: 04/27/2019  PT End of Session - 04/27/19 2209    Visit Number  14    Number of Visits  35    Date for PT Re-Evaluation  07/02/19    Authorization Type  Medicare Part A and B; 10th visit PN    PT Start Time  1330    PT Stop Time  1415    PT Time Calculation (min)  45 min    Equipment Utilized During Treatment  Other (comment)   flotation belt, pool noodle   Activity Tolerance  Patient tolerated treatment well   limited by spasticity   Behavior During Therapy  [facility] for tasks assessed/performed       Past Medical History:  Diagnosis Date  . Hyperlipidemia   . Hypertension   . Hypogonadism in male 11/01/2016  . Hypothyroidism   . Multiple sclerosis ([place])   . 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 Type 1 diabetes mellitus ([place]) dx'd 1994  . Vitamin D insufficiency 11/29/2016    Past Surgical History:  Procedure Laterality Date  . EYE SURGERY Bilateral    "laser for diabetic retinopathy"  . FRACTURE SURGERY    . OPEN REDUCTION INTERNAL FIXATION (ORIF) TIBIA/FIBULA FRACTURE Right 10/30/2016  . ORIF ANKLE FRACTURE Left 2015  . ORIF TIBIA FRACTURE Right 10/30/2016   Procedure: OPEN REDUCTION INTERNAL FIXATION (ORIF) TIBIA FIBULA FRACTURE;  Surgeon: Altamese Sheldon, MD;  Location: [facility];  Service: Orthopedics;  Laterality: Right;  . RETINAL LASER PROCEDURE Bilateral    "for diabetic retinopathy"    There were no vitals filed for this visit.  Subjective Assessment - 04/27/19 2206    Subjective  Pt states knees still will not bend - states 'it's really frustrating" ; pt amb. in pool area with RW - continues to lift body weight thru UE weight  bearing on RW    Pertinent History  multiple falls, vitamin D insufficiency, Type 1 IDDM, diabetic retinopathy, hypothyroidism, HTN, hyperlipidemia, R tibia fracture, L ankle fracture with ORIF    Limitations  Walking    Diagnostic tests  No new lesions seen on MRI    Patient Stated Goals  To improve walking    Currently in Pain?  No/denies          Aquatic therapy at [facility] - pool temp 87.4 degrees  Patient seen for aquatic therapy today.  Treatment took place in water 2.5-4 feet deep depending upon activity.  Pt entered and exited the pool via step negotiation step by step sequence with use of bil. hand rails with SBA: SBA with step ascension step over step sequence at end of session.   Pt performed gait training in pool with bil. UE support on pool noodle stabilized by PT - forwards 72m x 1 rep with pt stopping approx. 4 times during 33m across pool  due to knee hyperextension - to allow knees to flex from hyperextended position   Squats 10 reps with pt holding onto side of pool  Pt performed stepping activity with RLE/LLE to fascilitate knee control on each leg - pt stepped forward/back 10 reps with each leg while attempting to  Maintain stance leg in flexed position   Pt in supine position -  flotation belt used for increased buoyancy and flotation; pt performed hip abduction/adduction after stretching of hip adductors - 10 reps - 2 sets with rest period between sets  Pt placed both feet on pool wall (still in supine position) and was assisted with hip and knee flexion by PT pushing pt toward wall while feet were stabilized on wall - pt pushed away from wall for closed chain strengthening 10 reps with assist of PT   Pt requires buoyancy for support with balance and viscosity for resistance for strengthening exercises; buoyancy is also needed for off loading his body to assist with initiation of hip and knee flexion;  Buoyancy of water also provides support of body in supine for  spasticity reduction                                     PT Short Term Goals - 04/27/19 2215      PT SHORT TERM GOAL #1   Title  Patient will demonstrate independence with updated HEP    Time  4    Period  Weeks    Status  Revised    Target Date  05/01/19   target date extended due to pt on hold due to DVT in LUE     PT SHORT TERM GOAL #2   Title  Pt will improve gait velocity with RW to >/= 0.8 ft/sec with decreased use of UE to lift and swing RLE through    Baseline  declined to .44 ft/sec following Botox    Time  4    Period  Weeks    Status  Revised    Target Date  05/01/19      PT SHORT TERM GOAL #3   Title  Pt will improve BERG to >/= 33/56    Baseline  29/56    Time  4    Period  Weeks    Status  Revised    Target Date  05/01/19      PT SHORT TERM GOAL #4   Title  Pt will ambulate x 100' over indoor surfaces with RW and Bioness or AFO MOD I with 2 standing rest breaks and improved R hip and knee flexion and foot clearance without lifting through UE    Baseline  5-6 standing rest breaks to ambulate x 40' with RW and Bioness    Time  4    Period  Weeks    Status  Revised    Target Date  05/01/19      PT SHORT TERM GOAL #5   Title  Pt will negotate 8 stairs with two rails step to sequence with supervision    Time  4    Period  Weeks    Status  Revised    Target Date  05/01/19        PT Long Term Goals - 04/27/19 2215      PT LONG TERM GOAL #1   Title  Pt will demonstrate independence with land and aquatic HEP and will report going to [facility] 1-2x/week for ongoing fitness  (ALL LTG set for 90 days; after 12/4 please revise STG to 60 days out - 07/02/2019)    Time  12    Period  Weeks    Status  New      PT LONG TERM GOAL #2   Title  Pt will improve gait velocity with RW to >/=  1.8 ft/sec    Time  12    Period  Weeks    Status  Revised      PT LONG TERM GOAL #3   Title  Pt will improve BERG to >/= 37/56 to decrease falls risk     Time  12    Period  Weeks    Status  Revised      PT LONG TERM GOAL #4   Title  Pt will ambulate x 200' outside over pavement and grass with supervision and LRAD to improve safety ambulating to wood working shop at home    Time  12    Period  Weeks    Status  Revised      PT LONG TERM GOAL #5   Title  Pt will negotiate 8 stairs with 2 rails with alternating sequence with supervision    Time  12    Period  Weeks    Status  Revised            Plan - 04/27/19 2211    Clinical Impression Statement  Pt continues to have moderate to severe extensor tone/spasticity with difficulty initiating knee flexion in bil. LE's;  pt did amb. 17m across pool faster today (15") compared to 20" in previous aquatic therapy session due to frequent stops for rest and to fascilitate knee flexion bil. LE's.    PT Treatment/Interventions  ADLs/Self Care Home Management;Aquatic Therapy;Electrical Stimulation;DME Instruction;Gait training;Stair training;Functional mobility training;Therapeutic activities;Therapeutic exercise;Balance training;Neuromuscular re-education;Patient/family education;Orthotic Fit/Training;Passive range of motion;Energy conservation    PT Next Visit Plan  Needs to wear AFO.  use of Bioness on hamstrings for knee flexion/LE strengthening (prone ex's);  Endurance on [employer].  Continue to focus on core/postural control training without UE support, hip flexion and knee flexion strengthening, ankle DF strength - sitting on therapy ball.  SLS and weight shift training (rockerboard).    Consulted and Agree with Plan of Care  Patient       Patient will benefit from skilled therapeutic intervention in order to improve the following deficits and impairments:  Abnormal gait, Decreased balance, Decreased endurance, Decreased coordination, Decreased range of motion, Decreased strength, Difficulty walking, Impaired tone, Postural dysfunction, Decreased activity tolerance  Visit Diagnosis: Other  abnormalities of gait and mobility  Muscle weakness (generalized)     Problem List Patient Active Problem List   Diagnosis Date Noted  . Vitamin D insufficiency 11/29/2016  . Hypogonadism in male 11/01/2016  . Type 1 diabetes mellitus ([place])   . Hypothyroidism   . Hypertension   . Hyperlipidemia   . Closed fracture of right fibula and tibia 10/29/2016  . Closed tibia fracture 10/29/2016  . Closed fracture of right tibial plafond with fibula involvement 10/29/2016  . Abnormal liver function tests 03/06/2013  . RASH AND OTHER NONSPECIFIC SKIN ERUPTION 10/07/2009  . SHOULDER PAIN, LEFT 12/27/2008  . Type I (juvenile type) diabetes mellitus without mention of complication, uncontrolled 12/03/2007  . HYPOTHYROIDISM 11/12/2007  . HYPERCHOLESTEROLEMIA 11/12/2007  . MULTIPLE SCLEROSIS 08/29/2007  . PROLIFERATIVE DIABETIC RETINOPATHY 08/29/2007  . HYPERTENSION 12/25/2006    [place], [place], [place] 04/27/2019, 10:17 PM  [facility] [address] [place], [place], [address] Phone: [phone]   Fax:  [phone]  Name: Johnathan Ross MRN: [id number] Date of Birth: 10-Jan-1971

## 2019-04-27 NOTE — Telephone Encounter (Signed)
Patient called [facility] on 04/25/2019 at 3:18pm for arm pain. States to [facility] nurse that he has a blood clot a few months ago and was told to call if anything was different. States that he sees a vein on his arm and would like to know if they should keep an eye on it. Attempted to call patient to schedule a visit with Jodi Mourning. Left voicemail to call back to schedule.

## 2019-04-28 ENCOUNTER — Ambulatory Visit: Payer: Medicare Other | Admitting: Physical Therapy

## 2019-04-30 ENCOUNTER — Other Ambulatory Visit: Payer: Self-pay

## 2019-04-30 ENCOUNTER — Ambulatory Visit: Payer: Medicare Other | Attending: Urology | Admitting: Physical Therapy

## 2019-04-30 ENCOUNTER — Encounter: Payer: Self-pay | Admitting: Physical Therapy

## 2019-04-30 DIAGNOSIS — M6281 Muscle weakness (generalized): Secondary | ICD-10-CM | POA: Insufficient documentation

## 2019-04-30 DIAGNOSIS — R262 Difficulty in walking, not elsewhere classified: Secondary | ICD-10-CM | POA: Diagnosis not present

## 2019-04-30 DIAGNOSIS — M21371 Foot drop, right foot: Secondary | ICD-10-CM | POA: Diagnosis not present

## 2019-04-30 DIAGNOSIS — R2689 Other abnormalities of gait and mobility: Secondary | ICD-10-CM | POA: Insufficient documentation

## 2019-04-30 DIAGNOSIS — R296 Repeated falls: Secondary | ICD-10-CM | POA: Diagnosis not present

## 2019-04-30 DIAGNOSIS — R2681 Unsteadiness on feet: Secondary | ICD-10-CM

## 2019-04-30 NOTE — Therapy (Addendum)
[facility] [address] [place], [place], [address] Phone: [phone]   Fax:  [phone]  Physical Therapy Treatment  Patient Details  Name: Johnathan Ross MRN: [id number] Date of Birth: Oct 03, 1970 Referring Provider (PT): Carylon Perches, MD   Encounter Date: 04/30/2019  PT End of Session - 04/30/19 1356    Visit Number  15    Number of Visits  35    Date for PT Re-Evaluation  07/02/19    Authorization Type  Medicare Part A and B; 10th visit PN    PT Start Time  0850    PT Stop Time  0936    PT Time Calculation (min)  46 min    Equipment Utilized During Treatment  Other (comment)   flotation belt, pool noodle   Activity Tolerance  Patient tolerated treatment well   limited by spasticity   Behavior During Therapy  [facility] for tasks assessed/performed       Past Medical History:  Diagnosis Date  . Hyperlipidemia   . Hypertension   . Hypogonadism in male 11/01/2016  . Hypothyroidism   . Multiple sclerosis ([place])   . 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 Type 1 diabetes mellitus ([place]) dx'd 1994  . Vitamin D insufficiency 11/29/2016    Past Surgical History:  Procedure Laterality Date  . EYE SURGERY Bilateral    "laser for diabetic retinopathy"  . FRACTURE SURGERY    . OPEN REDUCTION INTERNAL FIXATION (ORIF) TIBIA/FIBULA FRACTURE Right 10/30/2016  . ORIF ANKLE FRACTURE Left 2015  . ORIF TIBIA FRACTURE Right 10/30/2016   Procedure: OPEN REDUCTION INTERNAL FIXATION (ORIF) TIBIA FIBULA FRACTURE;  Surgeon: Altamese Diamondhead, MD;  Location: [facility];  Service: Orthopedics;  Laterality: Right;  . RETINAL LASER PROCEDURE Bilateral    "for diabetic retinopathy"    There were no vitals filed for this visit.  Subjective Assessment - 04/30/19 0853    Subjective  Contacted vascular doctor becuase of a vein that popped out between L chest and arm.  No UE swelling; reports vascular feels it is collateral flow around  area of DVT.  Neurology contacted him but PT has not yet spoken with neurology.    Pertinent History  multiple falls, vitamin D insufficiency, Type 1 IDDM, diabetic retinopathy, hypothyroidism, HTN, hyperlipidemia, R tibia fracture, L ankle fracture with ORIF    Limitations  Walking    Diagnostic tests  No new lesions seen on MRI    Patient Stated Goals  To improve walking    Currently in Pain?  No/denies                       [facility] Adult PT Treatment/Exercise - 04/30/19 1346      Ambulation/Gait   Ambulation/Gait  Yes    Ambulation/Gait Assistance  5: Supervision    Ambulation/Gait Assistance Details  Pt continues to hop and swing LE through but able to take more consecutive steps today prior to LE fatigued and needing to hop    Ambulation Distance (Feet)  100 Feet    Assistive device  Rolling walker    Gait Pattern  Step-to pattern;Decreased step length - right;Decreased step length - left;Decreased hip/knee flexion - right;Decreased dorsiflexion - right;Right genu recurvatum;Poor foot clearance - right    Ambulation Surface  Level;Indoor      Therapeutic Activites    Therapeutic Activities  Other Therapeutic Activities    Other Therapeutic Activities  Pt asked PT to look at  vein on LUE - pt noted to have small, superficial vein over L pec muscle.  No edema in LUE noted.  Continued to discuss plan for therapy visits with 1 aquatic and 1 land appointment and will continue with therapy into the new year due to regression in safety with ambulation and strength following Botox.  PT will continue to reach out to neurology and will attempt to contact again.        Exercises   Exercises  Other Exercises    Other Exercises   With Bioness in training mode performed combination of R gastroc stretch with R foot on slant board while performing 10 reps sustained squats without UE support with 10 second hold.  Added to sustained squat weight shifting to R and advancing LLE forwards and  back x 10 reps.  Removed slant board and performed same exercise but with weight shift to LLE and advancing RLE forwards and back x 10 with therapist assisting with maintaining closed chain ankle DF.  Pt able to perform stepping in place without UE support.  When changed to reciprocal stepping forwards and back in squat pt required UE support on chair arm rest or thighs - performed 10 reps.  Final exercise pt performed sustained squat and had pt alternate between stepping L or RLE out to the side and back in x 10.  Throughout exercises pt required min-mod A from therapist for balance, full weight shifting and to maintain squat position with closed chain ankle DF on RLE.  Required 2 seated rest breaks once extensor tone in gastroc and hamstring increased.  Use of Bioness on quad today to oppose spasticity in hamstrings.      Modalities   Modalities  Teacher, English as a foreign language Location  R anterior tib and R quad   Electrical Stimulation Action  open and closed chain ankle DF, hip flexion and knee extension    Electrical Stimulation Parameters  see tablet 1 for adjusted parameters, steering electrode    Electrical Stimulation Goals  Strength;Tone;Neuromuscular facilitation             PT Education - 04/30/19 1356    Education Details  see TA    Person(s) Educated  Patient;Parent(s)    Methods  Explanation    Comprehension  Verbalized understanding       PT Short Term Goals - 04/27/19 2215      PT SHORT TERM GOAL #1   Title  Patient will demonstrate independence with updated HEP    Time  4    Period  Weeks    Status  Revised    Target Date  05/01/19   target date extended due to pt on hold due to DVT in LUE     PT SHORT TERM GOAL #2   Title  Pt will improve gait velocity with RW to >/= 0.8 ft/sec with decreased use of UE to lift and swing RLE through    Baseline  declined to .44 ft/sec following Botox    Time  4    Period  Weeks     Status  Revised    Target Date  05/01/19      PT SHORT TERM GOAL #3   Title  Pt will improve BERG to >/= 33/56    Baseline  29/56    Time  4    Period  Weeks    Status  Revised    Target Date  05/01/19  PT SHORT TERM GOAL #4   Title  Pt will ambulate x 100' over indoor surfaces with RW and Bioness or AFO MOD I with 2 standing rest breaks and improved R hip and knee flexion and foot clearance without lifting through UE    Baseline  5-6 standing rest breaks to ambulate x 40' with RW and Bioness    Time  4    Period  Weeks    Status  Revised    Target Date  05/01/19      PT SHORT TERM GOAL #5   Title  Pt will negotate 8 stairs with two rails step to sequence with supervision    Time  4    Period  Weeks    Status  Revised    Target Date  05/01/19        PT Long Term Goals - 04/27/19 2215      PT LONG TERM GOAL #1   Title  Pt will demonstrate independence with land and aquatic HEP and will report going to [facility] 1-2x/week for ongoing fitness  (ALL LTG set for 90 days; after 12/4 please revise STG to 60 days out - 07/02/2019)    Time  12    Period  Weeks    Status  New      PT LONG TERM GOAL #2   Title  Pt will improve gait velocity with RW to >/= 1.8 ft/sec    Time  12    Period  Weeks    Status  Revised      PT LONG TERM GOAL #3   Title  Pt will improve BERG to >/= 37/56 to decrease falls risk    Time  12    Period  Weeks    Status  Revised      PT LONG TERM GOAL #4   Title  Pt will ambulate x 200' outside over pavement and grass with supervision and LRAD to improve safety ambulating to wood working shop at home    Time  12    Period  Weeks    Status  Revised      PT LONG TERM GOAL #5   Title  Pt will negotiate 8 stairs with 2 rails with alternating sequence with supervision    Time  12    Period  Weeks    Status  Revised            Plan - 04/30/19 1356    Clinical Impression Statement  Treatment session focused on use of functional electrical  stimulation on R quad to oppose spasticity in hamstring during standing WB, strengthening and balance exercises.  During rest breaks continued discussion with pt regarding plan for therapy and visits.  Pt agreeable to plan.  Will continue to progress towards LTG.    PT Treatment/Interventions  ADLs/Self Care Home Management;Aquatic Therapy;Electrical Stimulation;DME Instruction;Gait training;Stair training;Functional mobility training;Therapeutic activities;Therapeutic exercise;Balance training;Neuromuscular re-education;Patient/family education;Orthotic Fit/Training;Passive range of motion;Energy conservation    PT Next Visit Plan  Check STG.  Needs to wear AFO.  use of Bioness on R ant tib, R quad to oppose hamstring spasticity; also use on hamstring for strengthening.  Maintained squat position.  Endurance on [employer].  Continue to focus on core/postural control training without UE support, hip flexion and knee flexion strengthening, ankle DF strength - sitting on therapy ball.  SLS and weight shift training (rockerboard)    Consulted and Agree with Plan of Care  Patient  Patient will benefit from skilled therapeutic intervention in order to improve the following deficits and impairments:  Abnormal gait, Decreased balance, Decreased endurance, Decreased coordination, Decreased range of motion, Decreased strength, Difficulty walking, Impaired tone, Postural dysfunction, Decreased activity tolerance  Visit Diagnosis: Other abnormalities of gait and mobility  Muscle weakness (generalized)  Foot drop, right  Unsteadiness on feet  Repeated falls  Difficulty in walking, not elsewhere classified     Problem List Patient Active Problem List   Diagnosis Date Noted  . Vitamin D insufficiency 11/29/2016  . Hypogonadism in male 11/01/2016  . Type 1 diabetes mellitus ([place])   . Hypothyroidism   . Hypertension   . Hyperlipidemia   . Closed fracture of right fibula and tibia 10/29/2016  .  Closed tibia fracture 10/29/2016  . Closed fracture of right tibial plafond with fibula involvement 10/29/2016  . Abnormal liver function tests 03/06/2013  . RASH AND OTHER NONSPECIFIC SKIN ERUPTION 10/07/2009  . SHOULDER PAIN, LEFT 12/27/2008  . Type I (juvenile type) diabetes mellitus without mention of complication, uncontrolled 12/03/2007  . HYPOTHYROIDISM 11/12/2007  . HYPERCHOLESTEROLEMIA 11/12/2007  . MULTIPLE SCLEROSIS 08/29/2007  . PROLIFERATIVE DIABETIC RETINOPATHY 08/29/2007  . HYPERTENSION 12/25/2006    [name], PT, DPT 04/30/19    2:01 PM    [facility] [address] [place], [place], [address] Phone: [phone]   Fax:  [phone]  Name: Johnathan Ross MRN: [id number] Date of Birth: Mar 01, 1971

## 2019-05-04 ENCOUNTER — Other Ambulatory Visit: Payer: Self-pay

## 2019-05-04 ENCOUNTER — Ambulatory Visit: Payer: Medicare Other | Admitting: Physical Therapy

## 2019-05-04 ENCOUNTER — Encounter: Payer: Self-pay | Admitting: Physical Therapy

## 2019-05-04 DIAGNOSIS — R2681 Unsteadiness on feet: Secondary | ICD-10-CM | POA: Diagnosis not present

## 2019-05-04 DIAGNOSIS — R2689 Other abnormalities of gait and mobility: Secondary | ICD-10-CM

## 2019-05-04 DIAGNOSIS — R262 Difficulty in walking, not elsewhere classified: Secondary | ICD-10-CM | POA: Diagnosis not present

## 2019-05-04 DIAGNOSIS — M6281 Muscle weakness (generalized): Secondary | ICD-10-CM

## 2019-05-04 DIAGNOSIS — M21371 Foot drop, right foot: Secondary | ICD-10-CM | POA: Diagnosis not present

## 2019-05-04 DIAGNOSIS — R296 Repeated falls: Secondary | ICD-10-CM | POA: Diagnosis not present

## 2019-05-04 NOTE — Therapy (Signed)
[facility] [address] [place], [place], [address] Phone: [phone]   Fax:  [phone]  Physical Therapy Treatment  Patient Details  Name: Johnathan Ross MRN: [id number] Date of Birth: 09/10/1970 Referring Provider (PT): Carylon Perches, MD   Encounter Date: 05/04/2019  PT End of Session - 05/04/19 1937    Visit Number  16    Number of Visits  35    Date for PT Re-Evaluation  07/02/19    Authorization Type  Medicare Part A and B; 10th visit PN    PT Start Time  1330    PT Stop Time  1415    PT Time Calculation (min)  45 min    Equipment Utilized During Treatment  Other (comment)   flotation belt, pool noodle   Activity Tolerance  Patient tolerated treatment well   limited by spasticity   Behavior During Therapy  [facility] for tasks assessed/performed       Past Medical History:  Diagnosis Date  . Hyperlipidemia   . Hypertension   . Hypogonadism in male 11/01/2016  . Hypothyroidism   . Multiple sclerosis ([place])   . 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 Type 1 diabetes mellitus ([place]) dx'd 1994  . Vitamin D insufficiency 11/29/2016    Past Surgical History:  Procedure Laterality Date  . EYE SURGERY Bilateral    "laser for diabetic retinopathy"  . FRACTURE SURGERY    . OPEN REDUCTION INTERNAL FIXATION (ORIF) TIBIA/FIBULA FRACTURE Right 10/30/2016  . ORIF ANKLE FRACTURE Left 2015  . ORIF TIBIA FRACTURE Right 10/30/2016   Procedure: OPEN REDUCTION INTERNAL FIXATION (ORIF) TIBIA FIBULA FRACTURE;  Surgeon: Altamese Taylor, MD;  Location: [facility];  Service: Orthopedics;  Laterality: Right;  . RETINAL LASER PROCEDURE Bilateral    "for diabetic retinopathy"    There were no vitals filed for this visit.  Subjective Assessment - 05/04/19 1931    Subjective  Pt reports he continues with significant spasticity impacting mobility despite the injections.    Pertinent History  multiple falls, vitamin D  insufficiency, Type 1 IDDM, diabetic retinopathy, hypothyroidism, HTN, hyperlipidemia, R tibia fracture, L ankle fracture with ORIF    Limitations  Walking    Diagnostic tests  No new lesions seen on MRI    Patient Stated Goals  To improve walking    Currently in Pain?  No/denies       Aquatic therapy at [facility] - pool temp 86.7 degrees  Patient seen for aquatic therapy today.  Treatment took place in water 2.5-4 feet deep depending upon activity.  Pt entered and exited the pool via step negotiation step by step sequence with use of bil. hand rails with SBA: SBA with step ascension step over step sequence at end of session.   Pt performed gait training in pool with bil. UE support on pool noodle stabilized by PT - forwards 50m x 2 reps with pt stopping approx. 4 times during each rep due to knee hyperextension causing decreased foot clearance as well- to allow knees to flex from hyperextended position.  Performed another 39m at end of session with UE support of pool noodle.   Squats 10 reps x 2 sets with pt holding onto side of pool  Pt performed stepping activity with RLE/LLE to fascilitate knee control on each leg - pt stepped forward/back 5 reps x 2 sets with each leg while attempting to  Maintain stance leg in flexed position.  Pt again with knee hyperextension after  approx 3 reps and needed increased time to complete next 2 reps before switching to other leg.    Pt in supine position - flotation belt used for increased buoyancy and flotation; pt performed hip abduction/adduction after stretching of hip adductors - 8 reps - 2 sets with rest period between sets.  Pt requiring AAROM after about 4 reps.  Pt with difficulty in supine position keeping legs up near surface of water.  Pt able to hold onto railing at ramp with 1 UE while PTA performed PROM/stretching of hip adductors.  Pt placed both feet on pool wall (still in supine position) and was assisted with hip and knee flexion by PT  pushing pt toward wall while feet were stabilized on wall - pt pushed away from wall for closed chain strengthening 10 reps with assist of PT.  Marching in place alternating LE's x 5 reps x 2 sets with 1UE assist.   Pt requires buoyancy for support with balance and viscosity for resistance for strengthening exercises; buoyancy is also needed for off loading his body to assist with initiation of hip and knee flexion;  Buoyancy of water also provides support of body in supine for spasticity reduction       PT Short Term Goals - 04/27/19 2215      PT SHORT TERM GOAL #1   Title  Patient will demonstrate independence with updated HEP    Time  4    Period  Weeks    Status  Revised    Target Date  05/01/19   target date extended due to pt on hold due to DVT in LUE     PT SHORT TERM GOAL #2   Title  Pt will improve gait velocity with RW to >/= 0.8 ft/sec with decreased use of UE to lift and swing RLE through    Baseline  declined to .44 ft/sec following Botox    Time  4    Period  Weeks    Status  Revised    Target Date  05/01/19      PT SHORT TERM GOAL #3   Title  Pt will improve BERG to >/= 33/56    Baseline  29/56    Time  4    Period  Weeks    Status  Revised    Target Date  05/01/19      PT SHORT TERM GOAL #4   Title  Pt will ambulate x 100' over indoor surfaces with RW and Bioness or AFO MOD I with 2 standing rest breaks and improved R hip and knee flexion and foot clearance without lifting through UE    Baseline  5-6 standing rest breaks to ambulate x 40' with RW and Bioness    Time  4    Period  Weeks    Status  Revised    Target Date  05/01/19      PT SHORT TERM GOAL #5   Title  Pt will negotate 8 stairs with two rails step to sequence with supervision    Time  4    Period  Weeks    Status  Revised    Target Date  05/01/19        PT Long Term Goals - 04/27/19 2215      PT LONG TERM GOAL #1   Title  Pt will demonstrate independence with land and aquatic HEP  and will report going to [facility] 1-2x/week for ongoing fitness  (ALL LTG set for 90  days; after 12/4 please revise STG to 60 days out - 07/02/2019)    Time  12    Period  Weeks    Status  New      PT LONG TERM GOAL #2   Title  Pt will improve gait velocity with RW to >/= 1.8 ft/sec    Time  12    Period  Weeks    Status  Revised      PT LONG TERM GOAL #3   Title  Pt will improve BERG to >/= 37/56 to decrease falls risk    Time  12    Period  Weeks    Status  Revised      PT LONG TERM GOAL #4   Title  Pt will ambulate x 200' outside over pavement and grass with supervision and LRAD to improve safety ambulating to wood working shop at home    Time  12    Period  Weeks    Status  Revised      PT LONG TERM GOAL #5   Title  Pt will negotiate 8 stairs with 2 rails with alternating sequence with supervision    Time  12    Period  Weeks    Status  Revised            Plan - 05/04/19 1940    Clinical Impression Statement  Pt continues to have moderate to severe extensor tone with limited knee flexion.  Also with quick muscle fatigue and usually only able to complete 3 reps of any given exercise before fatigue presents and pt unable to perform correctly.  Pt needing frequent rest breaks during session.  Asking appropriate questions today regarding endurance, spasticity and energy conservation.  Pt appears frustrated with lack of improvement with botox but continues to work hard during sessions.  Continue PT per POC.    PT Treatment/Interventions  ADLs/Self Care Home Management;Aquatic Therapy;Electrical Stimulation;DME Instruction;Gait training;Stair training;Functional mobility training;Therapeutic activities;Therapeutic exercise;Balance training;Neuromuscular re-education;Patient/family education;Orthotic Fit/Training;Passive range of motion;Energy conservation    PT Next Visit Plan  Check STG.  Needs to wear AFO.  use of Bioness on R ant tib, R quad to oppose hamstring spasticity; also use  on hamstring for strengthening.  Maintained squat position.  Endurance on [employer].  Continue to focus on core/postural control training without UE support, hip flexion and knee flexion strengthening, ankle DF strength - sitting on therapy ball.  SLS and weight shift training (rockerboard)    Consulted and Agree with Plan of Care  Patient       Patient will benefit from skilled therapeutic intervention in order to improve the following deficits and impairments:  Abnormal gait, Decreased balance, Decreased endurance, Decreased coordination, Decreased range of motion, Decreased strength, Difficulty walking, Impaired tone, Postural dysfunction, Decreased activity tolerance  Visit Diagnosis: Other abnormalities of gait and mobility  Muscle weakness (generalized)     Problem List Patient Active Problem List   Diagnosis Date Noted  . Vitamin D insufficiency 11/29/2016  . Hypogonadism in male 11/01/2016  . Type 1 diabetes mellitus ([place])   . Hypothyroidism   . Hypertension   . Hyperlipidemia   . Closed fracture of right fibula and tibia 10/29/2016  . Closed tibia fracture 10/29/2016  . Closed fracture of right tibial plafond with fibula involvement 10/29/2016  . Abnormal liver function tests 03/06/2013  . RASH AND OTHER NONSPECIFIC SKIN ERUPTION 10/07/2009  . SHOULDER PAIN, LEFT 12/27/2008  . Type I (juvenile type) diabetes mellitus  without mention of complication, uncontrolled 12/03/2007  . HYPOTHYROIDISM 11/12/2007  . HYPERCHOLESTEROLEMIA 11/12/2007  . MULTIPLE SCLEROSIS 08/29/2007  . PROLIFERATIVE DIABETIC RETINOPATHY 08/29/2007  . HYPERTENSION 12/25/2006    Narda Bonds, PTA [facility] 05/04/19 8:00 PM Phone: [phone] Fax: [facility] [address] [place], [place], [address] Phone: [phone]   Fax:  [phone]  Name: HALE HARTIGAN MRN:  [id number] Date of Birth: 1970/06/22

## 2019-05-05 ENCOUNTER — Ambulatory Visit: Payer: Medicare Other | Admitting: Physical Therapy

## 2019-05-07 ENCOUNTER — Ambulatory Visit: Payer: Medicare Other | Admitting: Physical Therapy

## 2019-05-07 ENCOUNTER — Other Ambulatory Visit: Payer: Self-pay

## 2019-05-07 ENCOUNTER — Telehealth: Payer: Self-pay | Admitting: Physical Therapy

## 2019-05-07 ENCOUNTER — Encounter: Payer: Self-pay | Admitting: Physical Therapy

## 2019-05-07 DIAGNOSIS — M21371 Foot drop, right foot: Secondary | ICD-10-CM | POA: Diagnosis not present

## 2019-05-07 DIAGNOSIS — R262 Difficulty in walking, not elsewhere classified: Secondary | ICD-10-CM | POA: Diagnosis not present

## 2019-05-07 DIAGNOSIS — R2681 Unsteadiness on feet: Secondary | ICD-10-CM | POA: Diagnosis not present

## 2019-05-07 DIAGNOSIS — R296 Repeated falls: Secondary | ICD-10-CM

## 2019-05-07 DIAGNOSIS — M6281 Muscle weakness (generalized): Secondary | ICD-10-CM | POA: Diagnosis not present

## 2019-05-07 DIAGNOSIS — R2689 Other abnormalities of gait and mobility: Secondary | ICD-10-CM | POA: Diagnosis not present

## 2019-05-07 NOTE — Therapy (Addendum)
[facility] [address] [place], [place], [address] Phone: [phone]   Fax:  [phone]  Physical Therapy Treatment  Patient Details  Name: Johnathan Ross MRN: [id number] Date of Birth: 07-03-1970 Referring Provider (PT): Carylon Perches, MD   Encounter Date: 05/07/2019  PT End of Session - 05/07/19 1304    Visit Number  17    Number of Visits  35    Date for PT Re-Evaluation  07/02/19    Authorization Type  Medicare Part A and B; 10th visit PN    PT Start Time  0850    PT Stop Time  0935    PT Time Calculation (min)  45 min    Equipment Utilized During Treatment  Other (comment)   flotation belt, pool noodle   Activity Tolerance  Patient tolerated treatment well   limited by spasticity   Behavior During Therapy  [facility] for tasks assessed/performed       Past Medical History:  Diagnosis Date  . Hyperlipidemia   . Hypertension   . Hypogonadism in male 11/01/2016  . Hypothyroidism   . Multiple sclerosis ([place])   . 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 Type 1 diabetes mellitus ([place]) dx'd 1994  . Vitamin D insufficiency 11/29/2016    Past Surgical History:  Procedure Laterality Date  . EYE SURGERY Bilateral    "laser for diabetic retinopathy"  . FRACTURE SURGERY    . OPEN REDUCTION INTERNAL FIXATION (ORIF) TIBIA/FIBULA FRACTURE Right 10/30/2016  . ORIF ANKLE FRACTURE Left 2015  . ORIF TIBIA FRACTURE Right 10/30/2016   Procedure: OPEN REDUCTION INTERNAL FIXATION (ORIF) TIBIA FIBULA FRACTURE;  Surgeon: Altamese , MD;  Location: [facility];  Service: Orthopedics;  Laterality: Right;  . RETINAL LASER PROCEDURE Bilateral    "for diabetic retinopathy"    There were no vitals filed for this visit.  Subjective Assessment - 05/07/19 0855    Subjective  Seeing an improvement during aquatic therapy - less shoulder use and less LE locking out.  PT spoke with neurologist that performed botox injections  regarding change in pt's mobility.  Neurologist will follow up with pt in January to determine if further botox treatments will be beneficial.    Pertinent History  multiple falls, vitamin D insufficiency, Type 1 IDDM, diabetic retinopathy, hypothyroidism, HTN, hyperlipidemia, R tibia fracture, L ankle fracture with ORIF    Limitations  Walking    Diagnostic tests  No new lesions seen on MRI    Patient Stated Goals  To improve walking    Currently in Pain?  No/denies                       [facility] Adult PT Treatment/Exercise - 05/07/19 1245      Ambulation/Gait   Ambulation/Gait  Yes    Ambulation/Gait Assistance  4: Min assist    Ambulation/Gait Assistance Details  with Bioness in gait mode and therapist providing cues and facilitation for closed chain DF on R during stance phase and active hip and knee flexion > knee extension during swing phase.  Pt only able to perform 3-4 steps before increase in gastroc and hamstring tone and inability to clear foot.    Ambulation Distance (Feet)  50 Feet    Assistive device  Rolling walker    Ambulation Surface  Level;Indoor      Therapeutic Activites    Therapeutic Activities  Other Therapeutic Activities    Other Therapeutic Activities  Discussed  with pt conversation with Dr. William Hamburger - alerted Dr. Bland Span to change in patient's mobility and functional gait since Botox injection, increased quad weakness and decreased ability to oppose hamstring spasticity and patient's significant increase in UE use to ambulate and swing each LE through.   Alerted pt that neurologist will follow up with him in January to discuss effects of Botox and if another injection would be indicated.  Pt verbalized understanding but is not sure if he will want to proceed with another injection.      Exercises   Exercises  Other Exercises    Other Exercises   With Bioness in training mode continued quad strengthening with UE support on counter top: RLE step ups on 4"  step with cues to avoid locking R knee in genu recurvatum at the top and slow eccentric control with quad when lowering back to floor x 10 reps.  Changed to performing stationary lunges first with foot on step and then without step x 5-8 reps with each LE forwards - focus on eccentric and concentric activation of quad muscle during knee flexion lower into lunge.  Final exercise with Bioness in training mode with R foot on weighted ball on floor with pt maintaining hip flexion while performing isolated knee extension <> flexion rolling ball forwards and back.        Modalities   Modalities  Teacher, English as a foreign language Location  R anterior tib and R quad    Electrical Stimulation Action  open and closed chain ankle DF, hip flexion and knee extension    Electrical Stimulation Parameters  see tablet 1 for adjusted parameters, steering electrode    Electrical Stimulation Goals  Strength;Tone;Neuromuscular facilitation             PT Education - 05/07/19 1303    Education Details  see TA    Person(s) Educated  Patient    Methods  Explanation    Comprehension  Verbalized understanding       PT Short Term Goals - 05/15/19 1026      PT SHORT TERM GOAL #1   Title  Patient will demonstrate independence with updated HEP    Time  4    Period  Weeks    Status  Revised    Target Date  06/01/19   target date extended due to pt on hold due to DVT in LUE     PT SHORT TERM GOAL #2   Title  Pt will improve gait velocity with RW to >/= 0.8 ft/sec with decreased use of UE to lift and swing RLE through    Baseline  declined to .44 ft/sec following Botox    Time  4    Period  Weeks    Status  Revised    Target Date  06/01/19      PT SHORT TERM GOAL #3   Title  Pt will improve BERG to >/= 33/56    Baseline  29/56    Time  4    Period  Weeks    Status  Revised    Target Date  06/01/19      PT SHORT TERM GOAL #4   Title  Pt will ambulate x  100' over indoor surfaces with RW and Bioness or AFO MOD I with 2 standing rest breaks and improved R hip and knee flexion and foot clearance without lifting through UE    Baseline  5-6 standing rest  breaks to ambulate x 40' with RW and Bioness    Time  4    Period  Weeks    Status  Revised    Target Date  06/01/19      PT SHORT TERM GOAL #5   Title  Pt will negotate 8 stairs with two rails step to sequence with supervision    Time  4    Period  Weeks    Status  Revised    Target Date  06/01/19        PT Long Term Goals - 04/27/19 2215      PT LONG TERM GOAL #1   Title  Pt will demonstrate independence with land and aquatic HEP and will report going to [facility] 1-2x/week for ongoing fitness  (ALL LTG set for 90 days; after 12/4 please revise STG to 60 days out - 07/02/2019)    Time  12    Period  Weeks    Status  New      PT LONG TERM GOAL #2   Title  Pt will improve gait velocity with RW to >/= 1.8 ft/sec    Time  12    Period  Weeks    Status  Revised      PT LONG TERM GOAL #3   Title  Pt will improve BERG to >/= 37/56 to decrease falls risk    Time  12    Period  Weeks    Status  Revised      PT LONG TERM GOAL #4   Title  Pt will ambulate x 200' outside over pavement and grass with supervision and LRAD to improve safety ambulating to wood working shop at home    Time  12    Period  Weeks    Status  Revised      PT LONG TERM GOAL #5   Title  Pt will negotiate 8 stairs with 2 rails with alternating sequence with supervision    Time  12    Period  Weeks    Status  Revised            Plan - 05/07/19 1304    Clinical Impression Statement  Deferred STG assessment at this time due to lack of change in patient's overall gait and functional status after BOTOX injection - all goals ongoing and PT to continue to progress towards LTG.  Continued to utilize Bioness for functional quad strengthening due to ongoing weakness and impaired mobility following Botox.  Pt to further  discuss Botox injection with neurologist at follow up in January.  Pt continues to fatigue quickly but stays motivated to continue with therapy.    PT Treatment/Interventions  ADLs/Self Care Home Management;Aquatic Therapy;Electrical Stimulation;DME Instruction;Gait training;Stair training;Functional mobility training;Therapeutic activities;Therapeutic exercise;Balance training;Neuromuscular re-education;Patient/family education;Orthotic Fit/Training;Passive range of motion;Energy conservation    PT Next Visit Plan  [place], do you think you will continue with Aquatic after next two aquatic sessions?  Use Bioness on R ant tib, R quad to oppose hamstring spasticity; also use on hamstring for strengthening.  Maintained squat position/lunge/R foot on ball roll outs.  Endurance on [employer].  Continue to focus on core/postural control training without UE support, hip flexion and knee flexion strengthening, ankle DF strength - sitting on therapy ball.  SLS and weight shift training (rockerboard)    Consulted and Agree with Plan of Care  Patient       Patient will benefit from skilled therapeutic intervention in order to improve  the following deficits and impairments:  Abnormal gait, Decreased balance, Decreased endurance, Decreased coordination, Decreased range of motion, Decreased strength, Difficulty walking, Impaired tone, Postural dysfunction, Decreased activity tolerance  Visit Diagnosis: Other abnormalities of gait and mobility  Muscle weakness (generalized)  Foot drop, right  Unsteadiness on feet  Repeated falls  Difficulty in walking, not elsewhere classified     Problem List Patient Active Problem List   Diagnosis Date Noted  . Vitamin D insufficiency 11/29/2016  . Hypogonadism in male 11/01/2016  . Type 1 diabetes mellitus ([place])   . Hypothyroidism   . Hypertension   . Hyperlipidemia   . Closed fracture of right fibula and tibia 10/29/2016  . Closed tibia fracture 10/29/2016  .  Closed fracture of right tibial plafond with fibula involvement 10/29/2016  . Abnormal liver function tests 03/06/2013  . RASH AND OTHER NONSPECIFIC SKIN ERUPTION 10/07/2009  . SHOULDER PAIN, LEFT 12/27/2008  . Type I (juvenile type) diabetes mellitus without mention of complication, uncontrolled 12/03/2007  . HYPOTHYROIDISM 11/12/2007  . HYPERCHOLESTEROLEMIA 11/12/2007  . MULTIPLE SCLEROSIS 08/29/2007  . PROLIFERATIVE DIABETIC RETINOPATHY 08/29/2007  . HYPERTENSION 12/25/2006    [name], PT, DPT 05/07/19    1:13 PM    [facility] [address] [place], [place], [address] Phone: [phone]   Fax:  [phone]  Name: Johnathan Ross MRN: [id number] Date of Birth: [birth year]

## 2019-05-07 NOTE — Telephone Encounter (Signed)
Physical therapist contacted neurologist, Dr. Bland Span, to discuss results of recent Botox injections to quad and gastroc muscles.  Alerted physician that pt was experiencing significant weakness in quad muscles and inability to use quad strength to over come hamstring spasticity.  Pt is now completely utilizing UE to lift his body and swing LE through on RW.  Also discussed with physician about recent axillary DVT and therapist's concern about re-occurrence of DVT due to overuse of UE for ambulation.  Physician did not feel this was a factor in patient's recent DVT but vascular surgery's recent note does state, "Given the patient's MS and use of his upper body with his walker, I suspect this is a form of venous thoracic outlet syndrome".   Patient has appointment with neurology in January - physician to assess patient's response to injection and determine if another injection, possibly in hamstrings, would be beneficial or indicated.  Pt made aware that PT and physician were able to connect by phone.  PT to continue to address quad weakness, spasticity and gait.  [name], PT, DPT 05/07/19    1:32 PM

## 2019-05-11 ENCOUNTER — Ambulatory Visit: Payer: Medicare Other | Admitting: Physical Therapy

## 2019-05-11 ENCOUNTER — Encounter: Payer: Self-pay | Admitting: Physical Therapy

## 2019-05-11 ENCOUNTER — Other Ambulatory Visit: Payer: Self-pay

## 2019-05-11 DIAGNOSIS — R2681 Unsteadiness on feet: Secondary | ICD-10-CM

## 2019-05-11 DIAGNOSIS — R262 Difficulty in walking, not elsewhere classified: Secondary | ICD-10-CM | POA: Diagnosis not present

## 2019-05-11 DIAGNOSIS — R296 Repeated falls: Secondary | ICD-10-CM | POA: Diagnosis not present

## 2019-05-11 DIAGNOSIS — M21371 Foot drop, right foot: Secondary | ICD-10-CM | POA: Diagnosis not present

## 2019-05-11 DIAGNOSIS — R2689 Other abnormalities of gait and mobility: Secondary | ICD-10-CM | POA: Diagnosis not present

## 2019-05-11 DIAGNOSIS — M6281 Muscle weakness (generalized): Secondary | ICD-10-CM | POA: Diagnosis not present

## 2019-05-12 ENCOUNTER — Ambulatory Visit: Payer: Medicare Other | Admitting: Physical Therapy

## 2019-05-12 NOTE — Therapy (Signed)
[facility] [address] [place], [place], [address] Phone: [phone]   Fax:  [phone]  Physical Therapy Treatment  Patient Details  Name: Johnathan Ross MRN: [id number] Date of Birth: 1970-12-23 Referring Provider (PT): Carylon Perches, MD   Encounter Date: 05/11/2019  PT End of Session - 05/11/19 2320    Visit Number  18    Number of Visits  35    Date for PT Re-Evaluation  07/02/19    Authorization Type  Medicare Part A and B; 10th visit PN    PT Start Time  1330    PT Stop Time  1430    PT Time Calculation (min)  60 min    Equipment Utilized During Treatment  Other (comment)   flotation belt, pool noodle   Activity Tolerance  Patient tolerated treatment well   limited by spasticity   Behavior During Therapy  [facility] for tasks assessed/performed       Past Medical History:  Diagnosis Date  . Hyperlipidemia   . Hypertension   . Hypogonadism in male 11/01/2016  . Hypothyroidism   . Multiple sclerosis ([place])   . 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 Type 1 diabetes mellitus ([place]) dx'd 1994  . Vitamin D insufficiency 11/29/2016    Past Surgical History:  Procedure Laterality Date  . EYE SURGERY Bilateral    "laser for diabetic retinopathy"  . FRACTURE SURGERY    . OPEN REDUCTION INTERNAL FIXATION (ORIF) TIBIA/FIBULA FRACTURE Right 10/30/2016  . ORIF ANKLE FRACTURE Left 2015  . ORIF TIBIA FRACTURE Right 10/30/2016   Procedure: OPEN REDUCTION INTERNAL FIXATION (ORIF) TIBIA FIBULA FRACTURE;  Surgeon: Altamese [place], MD;  Location: [facility];  Service: Orthopedics;  Laterality: Right;  . RETINAL LASER PROCEDURE Bilateral    "for diabetic retinopathy"    There were no vitals filed for this visit.  Subjective Assessment - 05/11/19 2318    Subjective  Pt presents for aquatic therapy at [facility] - states his shoulders didn't hurt as much in previous session as they did in the initial pool session several  weeks ago    Pertinent History  multiple falls, vitamin D insufficiency, Type 1 IDDM, diabetic retinopathy, hypothyroidism, HTN, hyperlipidemia, R tibia fracture, L ankle fracture with ORIF    Limitations  Walking    Diagnostic tests  No new lesions seen on MRI    Patient Stated Goals  To improve walking    Currently in Pain?  No/denies         Aquatic therapy at [facility] - pool temp 87.6 degrees  Patient seen for aquatic therapy today.  Treatment took place in water 3.5-4 feet deep depending upon activity.  Pt entered and exited the pool via step negotiation step by step sequence with use of bil. hand rails with SBA: min assist with step ascension due to  Fatigue at end of session; pt did use step over step sequence to ascend initial 2 steps for exiting pool  Pt performed gait training in pool with bil. UE support on pool noodle stabilized by PT - forwards 77m x 2 reps with pt frequently stopping  due to knee hyperextension/fatigue Pt performed stepping exercise with each LE - forward/back 10 reps each while holding onto rail at ramp in pool in 3.5' water depth; pt needed min tactile cue to keep knee on stance leg slightly flexed to work on knee control and stability   Squats 10 reps with pt holding onto side of pool  Pt in supine position - flotation belt used for increased buoyancy and flotation; pt performed hip abduction/adduction after stretching of hip adductors 10 reps with assist for full ROM;  bicycling legs was attempted but difficult for pt to perform due to difficulty initiating flexion of hips & knees  Pt placed both feet on pool wall (still in supine position) and was assisted with hip and knee flexion by PT pushing pt toward wall while feet were stabilized on wall - pt pushed away from wall for closed chain strengthening 10 reps with assist of PT  Pt held bar in pool - amb. Approx. 40' forward in crouched position to fascilitate knee flexion in swing through phase of gait  Pt  sat on bench in pool - performed LAQ 10 reps each leg with use of ankle cuff for increased buoyancy for extension - pt able to actively flex each knee 10 reps with rest period prn;  seated marching 10 reps each leg  Pt performed simulated leg press with pt lying supine with use of flotation belt - PT positioned pt to allow him to place bil. Feet on pool wall - PT pushed pt into flexion with hips and knees flexed, then pt pushed into extension with assistance as tolerated -10 reps   Pt requires buoyancy for support with balance and viscosity for resistance for strengthening exercises; buoyancy is also needed for off loading his body to assist with initiation of hip and knee flexion;  Buoyancy of water also provides support of body in supine for spasticity reduction                                     PT Short Term Goals - 05/12/19 1122      PT SHORT TERM GOAL #1   Title  Patient will demonstrate independence with updated HEP    Time  4    Period  Weeks    Status  On-going    Target Date  05/01/19   target date extended due to pt on hold due to DVT in LUE     PT SHORT TERM GOAL #2   Title  Pt will improve gait velocity with RW to >/= 0.8 ft/sec with decreased use of UE to lift and swing RLE through    Baseline  declined to .44 ft/sec following Botox    Time  4    Period  Weeks    Status  Unable to assess    Target Date  05/01/19      PT SHORT TERM GOAL #3   Title  Pt will improve BERG to >/= 33/56    Baseline  29/56    Time  4    Period  Weeks    Status  Unable to assess    Target Date  05/01/19      PT SHORT TERM GOAL #4   Title  Pt will ambulate x 100' over indoor surfaces with RW and Bioness or AFO MOD I with 2 standing rest breaks and improved R hip and knee flexion and foot clearance without lifting through UE    Baseline  5-6 standing rest breaks to ambulate x 40' with RW and Bioness    Time  4    Period  Weeks    Status  Unable to assess     Target Date  05/01/19      PT SHORT TERM GOAL #  5   Title  Pt will negotate 8 stairs with two rails step to sequence with supervision    Time  4    Period  Weeks    Status  Unable to assess    Target Date  05/01/19        PT Long Term Goals - 05/12/19 1122      PT LONG TERM GOAL #1   Title  Pt will demonstrate independence with land and aquatic HEP and will report going to [facility] 1-2x/week for ongoing fitness  (ALL LTG set for 90 days; after 12/4 please revise STG to 60 days out - 07/02/2019)    Time  12    Period  Weeks    Status  New      PT LONG TERM GOAL #2   Title  Pt will improve gait velocity with RW to >/= 1.8 ft/sec    Time  12    Period  Weeks    Status  Revised      PT LONG TERM GOAL #3   Title  Pt will improve BERG to >/= 37/56 to decrease falls risk    Time  12    Period  Weeks    Status  Revised      PT LONG TERM GOAL #4   Title  Pt will ambulate x 200' outside over pavement and grass with supervision and LRAD to improve safety ambulating to wood working shop at home    Time  12    Period  Weeks    Status  Revised      PT LONG TERM GOAL #5   Title  Pt will negotiate 8 stairs with 2 rails with alternating sequence with supervision    Time  12    Period  Weeks    Status  Revised            Plan - 05/12/19 1117    Clinical Impression Statement  Pt continues to demonstrate extensor tone/spasticity in quads and hamstring weakness/ decreased muscle endurance as pt is able to perform active knee flexion on RLE & LLE for approx. 3-4 reps prior to fatigue.  Pt continues to amb. into pool area with RW with swing thru gait pattern of LE's for advancement.  Minimal functional progress noted with aquatic exercises.    PT Treatment/Interventions  ADLs/Self Care Home Management;Aquatic Therapy;Electrical Stimulation;DME Instruction;Gait training;Stair training;Functional mobility training;Therapeutic activities;Therapeutic exercise;Balance training;Neuromuscular  re-education;Patient/family education;Orthotic Fit/Training;Passive range of motion;Energy conservation    PT Next Visit Plan  Letta Moynahan - we will continue with aquatic PT IF he wants to(??)  - will discuss as minimal progress noted with aquatic exercises:   Use Bioness on R ant tib, R quad to oppose hamstring spasticity; also use on hamstring for strengthening.  Maintained squat position/lunge/R foot on ball roll outs.  Endurance on [employer].  Continue to focus on core/postural control training without UE support, hip flexion and knee flexion strengthening, ankle DF strength - sitting on therapy ball.  SLS and weight shift training (rockerboard)    Consulted and Agree with Plan of Care  Patient       Patient will benefit from skilled therapeutic intervention in order to improve the following deficits and impairments:  Abnormal gait, Decreased balance, Decreased endurance, Decreased coordination, Decreased range of motion, Decreased strength, Difficulty walking, Impaired tone, Postural dysfunction, Decreased activity tolerance  Visit Diagnosis: Other abnormalities of gait and mobility  Muscle weakness (generalized)  Unsteadiness on feet  Problem List Patient Active Problem List   Diagnosis Date Noted  . Vitamin D insufficiency 11/29/2016  . Hypogonadism in male 11/01/2016  . Type 1 diabetes mellitus ([place])   . Hypothyroidism   . Hypertension   . Hyperlipidemia   . Closed fracture of right fibula and tibia 10/29/2016  . Closed tibia fracture 10/29/2016  . Closed fracture of right tibial plafond with fibula involvement 10/29/2016  . Abnormal liver function tests 03/06/2013  . RASH AND OTHER NONSPECIFIC SKIN ERUPTION 10/07/2009  . SHOULDER PAIN, LEFT 12/27/2008  . Type I (juvenile type) diabetes mellitus without mention of complication, uncontrolled 12/03/2007  . HYPOTHYROIDISM 11/12/2007  . HYPERCHOLESTEROLEMIA 11/12/2007  . MULTIPLE SCLEROSIS 08/29/2007  . PROLIFERATIVE DIABETIC  RETINOPATHY 08/29/2007  . HYPERTENSION 12/25/2006    [place], [place], [place] 05/12/2019, 11:24 AM  [facility] [address] [place], [place], [address] Phone: [phone]   Fax:  [phone]  Name: Johnathan Ross MRN: [id number] Date of Birth: May 10, 1971

## 2019-05-13 ENCOUNTER — Other Ambulatory Visit: Payer: Self-pay | Admitting: Endocrinology

## 2019-05-14 ENCOUNTER — Other Ambulatory Visit: Payer: Self-pay

## 2019-05-14 ENCOUNTER — Other Ambulatory Visit (INDEPENDENT_AMBULATORY_CARE_PROVIDER_SITE_OTHER): Payer: Medicare Other

## 2019-05-14 ENCOUNTER — Ambulatory Visit: Payer: Medicare Other | Admitting: Physical Therapy

## 2019-05-14 ENCOUNTER — Encounter: Payer: Self-pay | Admitting: Physical Therapy

## 2019-05-14 DIAGNOSIS — M21371 Foot drop, right foot: Secondary | ICD-10-CM

## 2019-05-14 DIAGNOSIS — R296 Repeated falls: Secondary | ICD-10-CM | POA: Diagnosis not present

## 2019-05-14 DIAGNOSIS — R2681 Unsteadiness on feet: Secondary | ICD-10-CM | POA: Diagnosis not present

## 2019-05-14 DIAGNOSIS — E063 Autoimmune thyroiditis: Secondary | ICD-10-CM | POA: Diagnosis not present

## 2019-05-14 DIAGNOSIS — R2689 Other abnormalities of gait and mobility: Secondary | ICD-10-CM

## 2019-05-14 DIAGNOSIS — M6281 Muscle weakness (generalized): Secondary | ICD-10-CM | POA: Diagnosis not present

## 2019-05-14 DIAGNOSIS — E1065 Type 1 diabetes mellitus with hyperglycemia: Secondary | ICD-10-CM

## 2019-05-14 DIAGNOSIS — R262 Difficulty in walking, not elsewhere classified: Secondary | ICD-10-CM | POA: Diagnosis not present

## 2019-05-14 LAB — COMPREHENSIVE METABOLIC PANEL
ALT: 22 U/L (ref 0–53)
AST: 24 U/L (ref 0–37)
Albumin: 4.6 g/dL (ref 3.5–5.2)
Alkaline Phosphatase: 95 U/L (ref 39–117)
BUN: 16 mg/dL (ref 6–23)
CO2: 29 mEq/L (ref 19–32)
Calcium: 9.7 mg/dL (ref 8.4–10.5)
Chloride: 100 mEq/L (ref 96–112)
Creatinine, Ser: 1.18 mg/dL (ref 0.40–1.50)
GFR: 80.14 mL/min (ref >60.00)
Glucose, Bld: 145 mg/dL — ABNORMAL HIGH (ref 70–99)
Potassium: 4 mEq/L (ref 3.5–5.1)
Sodium: 136 mEq/L (ref 135–145)
Total Bilirubin: 0.6 mg/dL (ref 0.2–1.2)
Total Protein: 7.4 g/dL (ref 6.0–8.3)

## 2019-05-14 LAB — TSH: TSH: 0.21 u[IU]/mL — ABNORMAL LOW (ref 0.35–4.50)

## 2019-05-14 LAB — T4, FREE: Free T4: 1.41 ng/dL (ref 0.60–1.60)

## 2019-05-14 LAB — HEMOGLOBIN A1C: Hgb A1c MFr Bld: 7.6 % — ABNORMAL HIGH (ref 4.6–6.5)

## 2019-05-14 NOTE — Therapy (Addendum)
[facility] [address] [place], [place], [address] Phone: [phone]   Fax:  [phone]  Physical Therapy Treatment  Patient Details  Name: Johnathan Ross MRN: [id number] Date of Birth: [birth year] Referring Provider (PT): Carylon Perches, MD   Encounter Date: 05/14/2019  PT End of Session - 05/14/19 0849    Visit Number  19    Number of Visits  35    Date for PT Re-Evaluation  07/02/19    Authorization Type  Medicare Part A and B; 10th visit PN    PT Start Time  0846    PT Stop Time  0930    PT Time Calculation (min)  44 min    Equipment Utilized During Treatment  Other (comment)   Bioness   Activity Tolerance  Patient tolerated treatment well;Patient limited by fatigue   limited by spasticity   Behavior During Therapy  [facility] for tasks assessed/performed       Past Medical History:  Diagnosis Date  . Hyperlipidemia   . Hypertension   . Hypogonadism in male 11/01/2016  . Hypothyroidism   . Multiple sclerosis ([place])   . 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 Type 1 diabetes mellitus ([place]) dx'd 1994  . Vitamin D insufficiency 11/29/2016    Past Surgical History:  Procedure Laterality Date  . EYE SURGERY Bilateral    "laser for diabetic retinopathy"  . FRACTURE SURGERY    . OPEN REDUCTION INTERNAL FIXATION (ORIF) TIBIA/FIBULA FRACTURE Right 10/30/2016  . ORIF ANKLE FRACTURE Left 2015  . ORIF TIBIA FRACTURE Right 10/30/2016   Procedure: OPEN REDUCTION INTERNAL FIXATION (ORIF) TIBIA FIBULA FRACTURE;  Surgeon: Altamese [place]wood[place], MD;  Location: [facility];  Service: Orthopedics;  Laterality: Right;  . RETINAL LASER PROCEDURE Bilateral    "for diabetic retinopathy"    There were no vitals filed for this visit.  Subjective Assessment - 05/14/19 0848    Subjective  No new complaints. No falls or pain to report. 4 rest breaks needed with gait from lobby into gym.    Pertinent History  multiple falls, vitamin D  insufficiency, Type 1 IDDM, diabetic retinopathy, hypothyroidism, HTN, hyperlipidemia, R tibia fracture, L ankle fracture with ORIF    Limitations  Walking    Diagnostic tests  No new lesions seen on MRI    Patient Stated Goals  To improve walking    Currently in Pain?  No/denies    Pain Score  0-No pain         [facility] Adult PT Treatment/Exercise - 05/14/19 0851      Transfers   Transfers  Sit to Stand;Stand to Sit;Floor to Transfer    Sit to Stand  5: Supervision;With upper extremity assist;From chair/3-in-1;From bed    Stand to Sit  5: Supervision;With upper extremity assist;To bed;To chair/3-in-1    Floor to Transfer  5: Supervision;With upper extremity assist    Floor to Transfer Details (indicate cue type and reason)  from mat table to/from red mat on floor      Ambulation/Gait   Ambulation/Gait  Yes    Ambulation/Gait Assistance  4: Min guard    Ambulation/Gait Assistance Details  pt to clinic today without AFO on. Reinforced he needs to weat the brace with all gait for safety, even on days we are doing Bioness in therapy. Pt continues to say it's too much of "a pain" to take off. muliple rest breaks with gait with pt demo'ing a swing through gait pattern on walker  due to decreased hip/knee flexion for ~75% of distance both ways.     Ambulation Distance (Feet)  70 Feet   x2   Assistive device  Rolling walker    Gait Pattern  Step-to pattern;Decreased step length - right;Decreased step length - left;Decreased hip/knee flexion - right;Decreased dorsiflexion - right;Right genu recurvatum;Poor foot clearance - right    Ambulation Surface  Level;Indoor      Neuro Re-ed    Neuro Re-ed Details   for strengthening/muscle re-ed concurrent with Bioness to right quads- mini squats x 10 reps in midline with on times/rest with off times; then x 10 reps laterally toward each side with on time/rest with off times.       Knee/Hip Exercises: Standing   Other Standing Knee Exercises  with UE  support: mini squats with on time, rest with off times for 3 sets of 5 reps. min guard to min assist as fatigue set in.       Acupuncturist Location  R anterior tib and R Wellsite geologist Action  for increased quad strengthening    Electrical Stimulation Parameters  refer to tablet 1 for adjusted parameters;steering electrodes.     Electrical Stimulation Goals  Strength;Neuromuscular facilitation               PT Short Term Goals - 05/15/19 1026      PT SHORT TERM GOAL #1   Title  Patient will demonstrate independence with updated HEP    Time  4    Period  Weeks    Status  Revised    Target Date  06/01/19   target date extended due to pt on hold due to DVT in LUE     PT SHORT TERM GOAL #2   Title  Pt will improve gait velocity with RW to >/= 0.8 ft/sec with decreased use of UE to lift and swing RLE through    Baseline  declined to .44 ft/sec following Botox    Time  4    Period  Weeks    Status  Revised    Target Date  06/01/19      PT SHORT TERM GOAL #3   Title  Pt will improve BERG to >/= 33/56    Baseline  29/56    Time  4    Period  Weeks    Status  Revised    Target Date  06/01/19      PT SHORT TERM GOAL #4   Title  Pt will ambulate x 100' over indoor surfaces with RW and Bioness or AFO MOD I with 2 standing rest breaks and improved R hip and knee flexion and foot clearance without lifting through UE    Baseline  5-6 standing rest breaks to ambulate x 40' with RW and Bioness    Time  4    Period  Weeks    Status  Revised    Target Date  06/01/19      PT SHORT TERM GOAL #5   Title  Pt will negotate 8 stairs with two rails step to sequence with supervision    Time  4    Period  Weeks    Status  Revised    Target Date  06/01/19        PT Long Term Goals - 05/12/19 1122      PT LONG TERM GOAL #1   Title  Pt will demonstrate independence with land  and aquatic HEP and will report going to [facility] 1-2x/week for  ongoing fitness  (ALL LTG set for 90 days; after 12/4 please revise STG to 60 days out - 07/02/2019)    Time  12    Period  Weeks    Status  New      PT LONG TERM GOAL #2   Title  Pt will improve gait velocity with RW to >/= 1.8 ft/sec    Time  12    Period  Weeks    Status  Revised      PT LONG TERM GOAL #3   Title  Pt will improve BERG to >/= 37/56 to decrease falls risk    Time  12    Period  Weeks    Status  Revised      PT LONG TERM GOAL #4   Title  Pt will ambulate x 200' outside over pavement and grass with supervision and LRAD to improve safety ambulating to wood working shop at home    Time  12    Period  Weeks    Status  Revised      PT LONG TERM GOAL #5   Title  Pt will negotiate 8 stairs with 2 rails with alternating sequence with supervision    Time  12    Period  Weeks    Status  Revised            Plan - 05/14/19 0849    Clinical Impression Statement  Today's skilled session continued to focus on right quad strengthening concurrent with Bioness. Pt needing rest breaks due to fatigue. Continued to reinforce use of brace with all gait for safety. The pt is making limited progress toward goals and should benefit from continued PT to progress toward unmet goals.    PT Treatment/Interventions  ADLs/Self Care Home Management;Aquatic Therapy;Electrical Stimulation;DME Instruction;Gait training;Stair training;Functional mobility training;Therapeutic activities;Therapeutic exercise;Balance training;Neuromuscular re-education;Patient/family education;Orthotic Fit/Training;Passive range of motion;Energy conservation    PT Next Visit Plan  Use Bioness on R ant tib, R quad to oppose hamstring spasticity; also use on hamstring for strengthening.  Maintained squat position/lunge/R foot on ball roll outs.  Endurance on [employer].  Continue to focus on core/postural control training without UE support, hip flexion and knee flexion strengthening, ankle DF strength - sitting on  therapy ball.  SLS and weight shift training (rockerboard)    Consulted and Agree with Plan of Care  Patient       Patient will benefit from skilled therapeutic intervention in order to improve the following deficits and impairments:  Abnormal gait, Decreased balance, Decreased endurance, Decreased coordination, Decreased range of motion, Decreased strength, Difficulty walking, Impaired tone, Postural dysfunction, Decreased activity tolerance  Visit Diagnosis: Other abnormalities of gait and mobility  Muscle weakness (generalized)  Unsteadiness on feet  Foot drop, right     Problem List Patient Active Problem List   Diagnosis Date Noted  . Vitamin D insufficiency 11/29/2016  . Hypogonadism in male 11/01/2016  . Type 1 diabetes mellitus ([place])   . Hypothyroidism   . Hypertension   . Hyperlipidemia   . Closed fracture of right fibula and tibia 10/29/2016  . Closed tibia fracture 10/29/2016  . Closed fracture of right tibial plafond with fibula involvement 10/29/2016  . Abnormal liver function tests 03/06/2013  . RASH AND OTHER NONSPECIFIC SKIN ERUPTION 10/07/2009  . SHOULDER PAIN, LEFT 12/27/2008  . Type I (juvenile type) diabetes mellitus without mention of complication, uncontrolled 12/03/2007  .  HYPOTHYROIDISM 11/12/2007  . HYPERCHOLESTEROLEMIA 11/12/2007  . MULTIPLE SCLEROSIS 08/29/2007  . PROLIFERATIVE DIABETIC RETINOPATHY 08/29/2007  . HYPERTENSION 12/25/2006    Willow Ora, PTA, [facility] [address] [place], [place] [address] [phone] 05/14/19, 9:30 PM   Name: Johnathan Ross MRN: [id number] Date of Birth: Jan 02, 1971

## 2019-05-18 ENCOUNTER — Other Ambulatory Visit: Payer: Self-pay

## 2019-05-18 ENCOUNTER — Telehealth: Payer: Self-pay | Admitting: Nutrition

## 2019-05-18 ENCOUNTER — Ambulatory Visit: Payer: Medicare Other | Admitting: Physical Therapy

## 2019-05-18 ENCOUNTER — Encounter: Payer: Self-pay | Admitting: Physical Therapy

## 2019-05-18 ENCOUNTER — Telehealth: Payer: Self-pay

## 2019-05-18 DIAGNOSIS — M21371 Foot drop, right foot: Secondary | ICD-10-CM | POA: Diagnosis not present

## 2019-05-18 DIAGNOSIS — R2689 Other abnormalities of gait and mobility: Secondary | ICD-10-CM | POA: Diagnosis not present

## 2019-05-18 DIAGNOSIS — R262 Difficulty in walking, not elsewhere classified: Secondary | ICD-10-CM | POA: Diagnosis not present

## 2019-05-18 DIAGNOSIS — R2681 Unsteadiness on feet: Secondary | ICD-10-CM

## 2019-05-18 DIAGNOSIS — M6281 Muscle weakness (generalized): Secondary | ICD-10-CM

## 2019-05-18 DIAGNOSIS — R296 Repeated falls: Secondary | ICD-10-CM | POA: Diagnosis not present

## 2019-05-18 NOTE — Telephone Encounter (Signed)
Pt called stating that he has spoken to [facility] for 3 months and unable to get Dexcom approved. According to pt, Leonia Reader, RN has a Dexcom to give to him. He is asking for [name] to call him back. Wanting to pick it up on Thursday. Pt hung up before I was able to verify callback #.

## 2019-05-18 NOTE — Telephone Encounter (Signed)
I phoned [facility].  They sent him a 3 month supply of Libre sensors on 03/27/2019.  They can not send out any more sensors until 06/26/18.  They do not have a order for Dexcom.   Please order the Dexcom sensors and transmitters to start on 06/27/19.  Thank you

## 2019-05-18 NOTE — Telephone Encounter (Signed)
Linda please discuss with me.

## 2019-05-18 NOTE — Telephone Encounter (Signed)
Pt must first call [employer] and request that the proper documentation be sent to the office.

## 2019-05-18 NOTE — Therapy (Signed)
[facility] [address] [place], [place], [address] Phone: [phone]   Fax:  [phone]  Physical Therapy Treatment & 10th visit progress note   Reporting period:  04-17-19 - 05-18-19 See below for progress towards goals - STG's have been revised due to minimal functional progress achieved  Patient Details  Name: Johnathan Ross MRN: [id number] Date of Birth: Sep 13, 1970 Referring Provider (PT): Carylon Perches, MD   Encounter Date: 05/18/2019  PT End of Session - 05/18/19 1730    Visit Number  20    Number of Visits  35    Date for PT Re-Evaluation  07/02/19    Authorization Type  Medicare Part A and B; 10th visit PN    PT Start Time  1315   pt arrived to pool 20" today   PT Stop Time  1415    PT Time Calculation (min)  60 min    Equipment Utilized During Treatment  Other (comment)   flotation belt and pool noodle   Activity Tolerance  Patient tolerated treatment well;Patient limited by fatigue   limited by spasticity   Behavior During Therapy  [facility] for tasks assessed/performed       Past Medical History:  Diagnosis Date  . Hyperlipidemia   . Hypertension   . Hypogonadism in male 11/01/2016  . Hypothyroidism   . Multiple sclerosis ([place])   . 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 Type 1 diabetes mellitus ([place]) dx'd 1994  . Vitamin D insufficiency 11/29/2016    Past Surgical History:  Procedure Laterality Date  . EYE SURGERY Bilateral    "laser for diabetic retinopathy"  . FRACTURE SURGERY    . OPEN REDUCTION INTERNAL FIXATION (ORIF) TIBIA/FIBULA FRACTURE Right 10/30/2016  . ORIF ANKLE FRACTURE Left 2015  . ORIF TIBIA FRACTURE Right 10/30/2016   Procedure: OPEN REDUCTION INTERNAL FIXATION (ORIF) TIBIA FIBULA FRACTURE;  Surgeon: Altamese [place], MD;  Location: [facility];  Service: Orthopedics;  Laterality: Right;  . RETINAL LASER PROCEDURE Bilateral    "for diabetic retinopathy"    There were no  vitals filed for this visit.  Subjective Assessment - 05/18/19 1727    Subjective  Pt presents for aquatic therapy at [facility] - pt reports no new issues or problems    Patient is accompained by:  Family member   parents accompany pt to pool   Pertinent History  multiple falls, vitamin D insufficiency, Type 1 IDDM, diabetic retinopathy, hypothyroidism, HTN, hyperlipidemia, R tibia fracture, L ankle fracture with ORIF    Limitations  Walking    Diagnostic tests  No new lesions seen on MRI    Patient Stated Goals  To improve walking    Currently in Pain?  No/denies          Aquatic therapy at [facility] - pool temp 87.4 degrees  Patient seen for aquatic therapy today.  Treatment took place in water 3.5-4 feet deep depending upon activity.  Pt entered and exited the pool via step negotiation step by step sequence with use of bil. hand rails with supervision:  SBA with step ascension at end of session  with pt using a step over step sequence to ascend the 4 steps for exiting pool  Runner's stretch each leg 30 sec hold at beginning of session for hamstring & gastroc stretch;  Pt placed toes/forefoot of each foot on pool wall for gastroc stretching but stated he did not feel much stretch with this position  Pt performed gait training  in pool with bil. UE support on pool noodle stabilized by PT - forwards 57m x 2 reps with pt frequently stopping  due to knee hyperextension/fatigue Pt performed standing hip extension and abduction 10 reps each leg with tactile cue initially to keep stance leg slighty flexed at knee to prevent excessive hyperextension   Squats 10 reps with pt holding onto side of pool; unilateral squats 10 reps each leg with pt holding onto pool edge with bil. UE support   Pt in supine position - flotation belt used for increased buoyancy and flotation; bicycling legs was attempted but difficult for pt to perform due to difficulty initiating flexion of hips & knees; pt held onto rail in  pool with bil. UE's - PT performed passive stretching of hip adductors with abduction/adduction exercise in supine position 10 reps   Pt placed both feet on pool wall (still in supine position) and was assisted with hip and knee flexion by PT pushing pt toward wall while feet were stabilized on wall - pt pushed away from wall for closed chain strengthening 10 reps with assist of PT Pt performed sidestepping with use of noodle for bil. UE support approx. 15' and then pt reported calf muscles were tight and felt as if they were going to cramp - moved to edge of pool for increased stability with UE support and pt performed sidestepping approx. 20' x 4 reps along side of pool - cues to keep knees flexed   Pt sat on bench in pool - performed LAQ 10 reps each leg - min assist to stay seated due to incr. buoyancy - pt able to actively flex each knee 10 reps with rest periods prn due to muscle fatigue; Pt performed tall kneeling on pool floor with use of bench for UE support - hip extension bil. LE's with knee flexed at 90 degrees 10 reps each 1/2 kneeling on each leg with UE support prn to maintain balance - CGA for safety ;  Pt performed sitting back towards heels in tall kneeling position 10 reps each; pt reported feeling minimal quad stretch in this position  Pt requires buoyancy for support with balance and viscosity for resistance for strengthening exercises; buoyancy is also needed for off loading his body to assist with initiation of hip and knee flexion;  Buoyancy of water also provides support of body in supine for spasticity reduction                                            PT Short Term Goals - 05/18/19 1735      PT SHORT TERM GOAL #1   Title  Patient will demonstrate independence with updated HEP    Time  4    Period  Weeks    Status  Revised    Target Date  06/01/19   target date extended due to pt on hold due to DVT in LUE     PT SHORT TERM GOAL  #2   Title  Pt will improve gait velocity with RW to >/= 0.8 ft/sec with decreased use of UE to lift and swing RLE through    Baseline  declined to .44 ft/sec following Botox    Time  4    Period  Weeks    Status  Revised    Target Date  06/01/19      PT SHORT  TERM GOAL #3   Title  Pt will improve BERG to >/= 33/56    Baseline  29/56    Time  4    Period  Weeks    Status  Revised    Target Date  06/01/19      PT SHORT TERM GOAL #4   Title  Pt will ambulate x 100' over indoor surfaces with RW and Bioness or AFO MOD I with 2 standing rest breaks and improved R hip and knee flexion and foot clearance without lifting through UE    Baseline  5-6 standing rest breaks to ambulate x 40' with RW and Bioness    Time  4    Period  Weeks    Status  Revised    Target Date  06/01/19      PT SHORT TERM GOAL #5   Title  Pt will negotate 8 stairs with two rails step to sequence with supervision    Time  4    Period  Weeks    Status  Revised    Target Date  06/01/19        PT Long Term Goals - 05/18/19 1738      PT LONG TERM GOAL #1   Title  Pt will demonstrate independence with land and aquatic HEP and will report going to [facility] 1-2x/week for ongoing fitness  (ALL LTG set for 90 days; after 12/4 please revise STG to 60 days out - 07/02/2019)    Time  12    Period  Weeks    Status  New      PT LONG TERM GOAL #2   Title  Pt will improve gait velocity with RW to >/= 1.8 ft/sec    Time  12    Period  Weeks    Status  Revised      PT LONG TERM GOAL #3   Title  Pt will improve BERG to >/= 37/56 to decrease falls risk    Time  12    Period  Weeks    Status  Revised      PT LONG TERM GOAL #4   Title  Pt will ambulate x 200' outside over pavement and grass with supervision and LRAD to improve safety ambulating to wood working shop at home    Time  12    Period  Weeks    Status  Revised      PT LONG TERM GOAL #5   Title  Pt will negotiate 8 stairs with 2 rails with alternating  sequence with supervision    Time  12    Period  Weeks    Status  Revised            Plan - 05/18/19 1732    Clinical Impression Statement  Pt continues to present with extensor tone/spasticity and significant hamstring weakness; pt is able to flex each knee approx. 75% full ROM for 2-3 reps and then muscles fatigue, resulting in inability to flex knees and prevent hyperextension.  Pt reported significant calf tightness bil. LE's with sidestepping with use of noodle for bil. UE support.  MInimal changes in functional status noted with aquatic therapy.    PT Treatment/Interventions  ADLs/Self Care Home Management;Aquatic Therapy;Electrical Stimulation;DME Instruction;Gait training;Stair training;Functional mobility training;Therapeutic activities;Therapeutic exercise;Balance training;Neuromuscular re-education;Patient/family education;Orthotic Fit/Training;Passive range of motion;Energy conservation    PT Next Visit Plan  Use Bioness on R ant tib, R quad to oppose hamstring spasticity; also use on hamstring for strengthening.  Maintained squat position/lunge/R foot on ball roll outs.  Endurance on [employer].  Continue to focus on core/postural control training without UE support, hip flexion and knee flexion strengthening, ankle DF strength - sitting on therapy ball.  SLS and weight shift training (rockerboard)    Consulted and Agree with Plan of Care  Patient       Patient will benefit from skilled therapeutic intervention in order to improve the following deficits and impairments:  Abnormal gait, Decreased balance, Decreased endurance, Decreased coordination, Decreased range of motion, Decreased strength, Difficulty walking, Impaired tone, Postural dysfunction, Decreased activity tolerance  Visit Diagnosis: Other abnormalities of gait and mobility  Muscle weakness (generalized)  Unsteadiness on feet     Problem List Patient Active Problem List   Diagnosis Date Noted  . Vitamin D  insufficiency 11/29/2016  . Hypogonadism in male 11/01/2016  . Type 1 diabetes mellitus ([place])   . Hypothyroidism   . Hypertension   . Hyperlipidemia   . Closed fracture of right fibula and tibia 10/29/2016  . Closed tibia fracture 10/29/2016  . Closed fracture of right tibial plafond with fibula involvement 10/29/2016  . Abnormal liver function tests 03/06/2013  . RASH AND OTHER NONSPECIFIC SKIN ERUPTION 10/07/2009  . SHOULDER PAIN, LEFT 12/27/2008  . Type I (juvenile type) diabetes mellitus without mention of complication, uncontrolled 12/03/2007  . HYPOTHYROIDISM 11/12/2007  . HYPERCHOLESTEROLEMIA 11/12/2007  . MULTIPLE SCLEROSIS 08/29/2007  . PROLIFERATIVE DIABETIC RETINOPATHY 08/29/2007  . HYPERTENSION 12/25/2006    [place], [place], [place] 05/18/2019, 5:41 PM  [facility] [address] [place], [place], [address] Phone: [phone]   Fax:  [phone]  Name: Johnathan Ross MRN: [id number] Date of Birth: 16-May-1971

## 2019-05-19 ENCOUNTER — Ambulatory Visit: Payer: Medicare Other | Admitting: Physical Therapy

## 2019-05-21 ENCOUNTER — Other Ambulatory Visit: Payer: Self-pay

## 2019-05-21 ENCOUNTER — Ambulatory Visit (INDEPENDENT_AMBULATORY_CARE_PROVIDER_SITE_OTHER): Payer: Medicare Other | Admitting: Endocrinology

## 2019-05-21 VITALS — BP 124/72 | HR 107 | Temp 99.2°F | Ht 73.0 in | Wt 187.0 lb

## 2019-05-21 DIAGNOSIS — E063 Autoimmune thyroiditis: Secondary | ICD-10-CM | POA: Diagnosis not present

## 2019-05-21 DIAGNOSIS — E782 Mixed hyperlipidemia: Secondary | ICD-10-CM

## 2019-05-21 DIAGNOSIS — E1065 Type 1 diabetes mellitus with hyperglycemia: Secondary | ICD-10-CM | POA: Diagnosis not present

## 2019-05-21 NOTE — Progress Notes (Signed)
Subjective:              Patient ID: Johnathan Ross, male   DOB: [birth year], 48 y.o.   MRN: [id number]    Diabetes   Diagnosis: Type 1 diabetes mellitus, date of diagnosis:  1995.   Insulin Pump followup:   CURRENT brand:  Medtronic  630 G   HISTORY: An insulin pump has been in use since 03/13/11.   PUMP settings are basal rate:  12 AM-2 AM = 0.35, 2 AM-6 AM = 0.65,, 6 a.m.= 1.75, 7 AM = 1. 5 5,. 9 AM = 1. 85, 6 PM = 1.7 9 PM = 0.5 10 PM = 1.7  Carbohydrate to insulin ratio: 8 AM = 1: 8, at 11 a.m. = 1: 5, after 6 p.m. = 1:7, hyperglycemia correction factor: 1: 35,and after 10 p.m. 1:30, target 120, active insulin time 4 hours.   Recent history:  His A1c has usually been over 8% and now 7.6   Current blood sugar patterns Evaluated from freestyle Libre and pump download, management and problems identified:  Current management, blood sugar patterns and problems identified     He does not have any data for his freestyle libre available since he uses his smart phone and has not connected this to our website again  Although his A1c is better his recent blood sugars are averaging 200  Also he still has not called about switching to Dexcom, he was told to call the supplier directly as required by Medicare  He again tries to be reducing his overnight basal because he thinks he gets low sugars, he thinks his sugar was 69 at 3 AM recently but this is not documented on his pump  He has stopped disconnecting the pump during the night which he was doing before  His blood sugars are averaging nearly 200 throughout the day except somewhat lower around 8-10 AM; lab glucose was 145 at about 10 AM  Recently has only 1 other HYPOGLYCEMIA documented at about 7:30 PM  Recently is going for water therapy and he will notices for 1 hour he will sometimes have his pump of for up to 5 hours  This will cause hyperglycemia at times he still feels that he has low sugars during the  night  Difficult to assess POSTPRANDIAL readings since frequently will check readings 2 hours after meals  Also not always entering carbohydrates at the time of the boluses   PRE-MEAL Fasting Lunch Dinner  overnight Overall  Glucose range:  161-233  163-234  58-293  177-240   Mean/median:  228  172  184   204   POST-MEAL PC Breakfast PC Lunch PC Dinner  Glucose range:    199-331  Mean/median:      Previous data:   CGM use % of time  80  2-week average/SD  160  Time in range     64   %  % Time Above 180  26  % Time above 250  7  % Time Below 70  3     PRE-MEAL Fasting Lunch Dinner Bedtime Overall  Glucose range:       Averages:  169  184  151  150  160, GV 35   POST-MEAL PC Breakfast PC Lunch PC Dinner  Glucose range:     Averages:    156   AVERAGE 2-4 AM = 124     Wt Readings from Last 3 Encounters:  05/21/19 187 lb (84.8 kg)  04/06/19 192 lb 4.8 oz (87.2 kg)  02/21/19 189 lb (85.7 kg)     Lab Results  Component Value Date   HGBA1C 7.6 (H) 05/14/2019   HGBA1C 8.0 (H) 11/27/2018   HGBA1C 8.4 (H) 06/30/2018   Lab Results  Component Value Date   MICROALBUR 1.1 12/23/2017   LDLCALC 61 11/27/2018   CREATININE 1.18 05/14/2019     OTHER active problems discussed today: See review of systems    Allergies as of 05/21/2019   No Known Allergies     Medication List       Accurate as of May 21, 2019  9:05 AM. If you have any questions, ask your nurse or doctor.        Ampyra 10 MG Tb12 Generic drug: dalfampridine Take 10 mg by mouth See admin instructions. Take 10 mg by mouth in the morning and 10 mg mid-afternoon   Contour Next Test test strip Generic drug: glucose blood USE AS INSTRUCTED TO CHECK BLOOD SUGARS 8 TIMES DAILY What changed: See the new instructions.   insulin lispro 100 UNIT/ML injection Commonly known as: HUMALOG Use max of 80 units daily via insulin pump. **replaces Novolog since Psychiatric nurse cover.** What changed:    when to take this  additional instructions   lisinopril 10 MG tablet Commonly known as: ZESTRIL TAKE 1 TABLET BY MOUTH EVERY DAY   rivaroxaban 20 MG Tabs tablet Commonly known as: Xarelto Take 1 tablet (20 mg total) by mouth daily with supper.   simvastatin 20 MG tablet Commonly known as: ZOCOR TAKE 1 TABLET BY MOUTH AT BEDTIME   Synthroid 125 MCG tablet Generic drug: levothyroxine TAKE 1 TABLET (125 MCG TOTAL) BY MOUTH DAILY BEFORE BREAKFAST.   tiZANidine 4 MG capsule Commonly known as: ZANAFLEX Take 4 mg by mouth 3 (three) times daily.       Allergies: No Known Allergies  Past Medical History:  Diagnosis Date  . Hyperlipidemia   . Hypertension   . Hypogonadism in male 11/01/2016  . Hypothyroidism   . Multiple sclerosis ([place])   . 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 Type 1 diabetes mellitus ([place]) dx'd 1994  . Vitamin D insufficiency 11/29/2016    Past Surgical History:  Procedure Laterality Date  . EYE SURGERY Bilateral    "laser for diabetic retinopathy"  . FRACTURE SURGERY    . OPEN REDUCTION INTERNAL FIXATION (ORIF) TIBIA/FIBULA FRACTURE Right 10/30/2016  . ORIF ANKLE FRACTURE Left 2015  . ORIF TIBIA FRACTURE Right 10/30/2016   Procedure: OPEN REDUCTION INTERNAL FIXATION (ORIF) TIBIA FIBULA FRACTURE;  Surgeon: Altamese Farmington, MD;  Location: [facility];  Service: Orthopedics;  Laterality: Right;  . RETINAL LASER PROCEDURE Bilateral    "for diabetic retinopathy"    Family History  Problem Relation Age of Onset  . Diabetes Sister   . Colon cancer Maternal Grandmother   . Colon polyps Maternal Uncle     Social History:  reports that he has never smoked. He has never used smokeless tobacco. He reports that he does not drink alcohol or use drugs.    Review of Systems      HYPERCHOLESTEROLEMIA: He has been taking simvastatin 20 mg daily  LDL is below 100 consistently  Has no increase in ALT  Lab Results  Component Value Date   CHOL 111  11/27/2018   HDL 40.00 11/27/2018   LDLCALC 61 11/27/2018   LDLDIRECT 139.7 04/09/2014   TRIG 48.0 11/27/2018   CHOLHDL 3 11/27/2018   Lab Results  Component Value Date   ALT 22 05/14/2019    HYPERTENSION:  has  been on lisinopril with good control  of blood pressure    BP Readings from Last 3 Encounters:  05/21/19 124/72  04/06/19 112/63  02/25/19 126/76    He has MULTIPLE sclerosis, is followed in [place] by neurologist and still not on treatment  HYPOTHYROIDISM: He has had longstanding primary 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 autoimmune  His thyroid dosage has changed frequently and has had to change his regimen with every visit  His TSH has been extremely variable and not clear why it fluctuates He also does not feel any different whether his thyroid level is high or low More recently has required somewhat lower doses progressively  His dose has been reduced to 127 mcg because of persistently low TSH level He is not able to get brand-name Synthroid also  Subjectively doing well He is regular with taking his supplement before breakfast daily TSH is now only slightly below normal with normal free T4   Lab Results  Component Value Date   TSH 0.21 (L) 05/14/2019   TSH 0.01 (L) 11/27/2018   TSH 0.04 (L) 08/27/2018   FREET4 1.41 05/14/2019   FREET4 2.24 (H) 11/27/2018   FREET4 0.95 08/27/2018        Objective:   Physical Exam  BP 124/72 (BP Location: Right Arm, Patient Position: Sitting, Cuff Size: Normal)   Pulse (!) 107   Temp 99.2 F (37.3 C)   Ht 6\' 1"  (1.854 m)   Wt 187 lb (84.8 kg)   SpO2 97%   BMI 24.67 kg/m            Assessment:      DIABETES type I on insulin pump:  See history of present illness for detailed discussion of current diabetes management, blood sugar patterns and problems identified  He has an A1c below 8% for the first time in a long time  However recently his blood sugars on his pump indicate an average of 200 and his  average blood sugar at any given time is 200 or more Highest blood sugars are in the early and late evenings Currently his basal rate is the same from 9 AM to 6 PM and this will need to be adjusted Also as discussed above difficult to assess his postprandial readings especially since he does not enter carbohydrates frequently for his meals  His data from the freestyle Elenor Legato is still somewhat incomplete and he has not been able to link his account to our website   HYPOTHYROIDISM:  TSH is persistently low Although he has inconsistent requirement for levothyroxine he appears to be needing gradually decreasing doses now in the last few months  Since his TSH is almost normal with brand-name Synthroid will make only slight adjustment  Lipids: Has been well controlled with simvastatin 20 mg  HYPERTENSION: Appears well controlled  Continue same regimen   Plan:     BASAL rate changes: Midnight = 0.35, 2 AM = 0.65, 3 AM = 0.7, 7 AM = 1.85, 9 AM = 1.95, 6 PM = 1.85 and 9 PM-12 AM = 1.60  He will try to get the Dexcom sensor by calling [facility], this likely will be covered Discussed differences with this compared to freestyle libre and advantages Also may consider T-insulin pump when he has an upgrade available on his current pump  He will try to upload his freestyle libre and see if he can connect his account to our  website  Needs to enter blood sugars and carbohydrates for every meal and snack consistently for better coverage Consider increasing overnight basal rate if his freestyle libre indicates consistently higher readings Also will be able to better assess his carbohydrate ratio when he has CGM available Encouraged him to take a correction bolus before disconnecting his pump for his water therapy and not allow the pump to be off for more than a couple of hours Call if having any unexpected hypoglycemia   HYPOTHYROIDISM: He will reduce the dose by 1/2 tablet weekly but  continue Synthroid brand name 125 mcg prescription as before  Follow-up 3 months   Counseling time on subjects discussed in assessment and plan sections is over 50% of today's 25 minute visit    Patient Instructions  Take 1/2 pill once a week for thyroid      Elayne Snare  Note: This office note was prepared with Estate agent. Any transcriptional errors that result from this process are unintentional.

## 2019-05-21 NOTE — Patient Instructions (Signed)
Take 1/2 pill once a week for thyroid

## 2019-05-25 ENCOUNTER — Ambulatory Visit: Payer: Medicare Other | Admitting: Physical Therapy

## 2019-05-26 ENCOUNTER — Ambulatory Visit: Payer: Medicare Other | Admitting: Physical Therapy

## 2019-06-01 ENCOUNTER — Other Ambulatory Visit: Payer: Self-pay

## 2019-06-01 ENCOUNTER — Ambulatory Visit: Payer: Medicare Other | Attending: Urology | Admitting: Physical Therapy

## 2019-06-01 DIAGNOSIS — M21372 Foot drop, left foot: Secondary | ICD-10-CM | POA: Diagnosis not present

## 2019-06-01 DIAGNOSIS — M6281 Muscle weakness (generalized): Secondary | ICD-10-CM | POA: Insufficient documentation

## 2019-06-01 DIAGNOSIS — R2681 Unsteadiness on feet: Secondary | ICD-10-CM | POA: Insufficient documentation

## 2019-06-01 DIAGNOSIS — R262 Difficulty in walking, not elsewhere classified: Secondary | ICD-10-CM | POA: Diagnosis not present

## 2019-06-01 DIAGNOSIS — R2689 Other abnormalities of gait and mobility: Secondary | ICD-10-CM | POA: Insufficient documentation

## 2019-06-01 DIAGNOSIS — R296 Repeated falls: Secondary | ICD-10-CM | POA: Diagnosis not present

## 2019-06-01 DIAGNOSIS — M21371 Foot drop, right foot: Secondary | ICD-10-CM | POA: Insufficient documentation

## 2019-06-01 NOTE — Therapy (Signed)
[facility] [address] [place], [place], [address] Phone: [phone]   Fax:  [phone]  Physical Therapy Treatment  Patient Details  Name: Johnathan Ross MRN: [id number] Date of Birth: August 24, 1970 Referring Provider (PT): Carylon Perches, MD   Encounter Date: 06/01/2019  PT End of Session - 06/01/19 2048    Visit Number  21    Number of Visits  35    Date for PT Re-Evaluation  07/02/19    Authorization Type  Medicare Part A and B; 10th visit PN    PT Start Time  1325    PT Stop Time  1415    PT Time Calculation (min)  50 min    Equipment Utilized During Treatment  --   flotation belt and pool noodle; aquatic ankle cuffs   Activity Tolerance  Patient tolerated treatment well;Patient limited by fatigue   limited by spasticity   Behavior During Therapy  WFL for tasks assessed/performed       Past Medical History:  Diagnosis Date  . Hyperlipidemia   . Hypertension   . Hypogonadism in male 11/01/2016  . Hypothyroidism   . Multiple sclerosis ([place])   . 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 Type 1 diabetes mellitus ([place]) dx'd 1994  . Vitamin D insufficiency 11/29/2016    Past Surgical History:  Procedure Laterality Date  . EYE SURGERY Bilateral    "laser for diabetic retinopathy"  . FRACTURE SURGERY    . OPEN REDUCTION INTERNAL FIXATION (ORIF) TIBIA/FIBULA FRACTURE Right 10/30/2016  . ORIF ANKLE FRACTURE Left 2015  . ORIF TIBIA FRACTURE Right 10/30/2016   Procedure: OPEN REDUCTION INTERNAL FIXATION (ORIF) TIBIA FIBULA FRACTURE;  Surgeon: Altamese [place], MD;  Location: [facility];  Service: Orthopedics;  Laterality: Right;  . RETINAL LASER PROCEDURE Bilateral    "for diabetic retinopathy"    There were no vitals filed for this visit.  Subjective Assessment - 06/01/19 2046    Subjective  Pt presents for aquatic therapy at [facility] - no new problems or issues reported    Patient is accompained by:  Family  member   parents accompany pt to pool   Pertinent History  multiple falls, vitamin D insufficiency, Type 1 IDDM, diabetic retinopathy, hypothyroidism, HTN, hyperlipidemia, R tibia fracture, L ankle fracture with ORIF    Limitations  Walking    Diagnostic tests  No new lesions seen on MRI    Patient Stated Goals  To improve walking    Currently in Pain?  No/denies           Aquatic therapy at [facility] - pool temp 87.4 degrees  Patient seen for aquatic therapy today.  Treatment took place in water 3.5-4 feet deep depending upon activity.  Pt entered and exited the pool via step negotiation step by step sequence with use of bil. hand rails with SBA: CGA for step ascension for exiting pool at end of sessionl  Pt performed gait training in pool with bil. UE support on pool noodle stabilized by PT - forwards 35m x 2 reps with pt not stopping on 1st rep until after amb. Approx. 25m across pool - no hyperextension in either leg noted during initial 31m amb. Forwards across pool   Pt performed stepping exercise with each LE - forward/back 5 reps each while holding onto rail edge of pool in 4'' water depth; pt needed min tactile cue to keep knee on stance leg slightly flexed to work on knee control and stability  Squats 10 reps with pt holding onto side of pool; unilateral squats 10 reps each leg with bil. UE support on side of pool  Pt performed bil. Hip flexion, extension, abduction 10 reps each leg with use of ankle cuff for increased buoyancy and ROM concentrically and for increased resistance eccentrically   Pt in supine position - flotation belt used for increased buoyancy and flotation; pt performed hip abduction/adduction after stretching of hip adductors 10 reps with assist for full ROM;  bicycling legs was attempted but too difficult for pt to perform due weakness of hamstrings and hip flexors  Pt performed simulated leg press with pt lying supine with use of flotation belt - PT positioned pt  to allow him to place bil. Feet on pool wall - PT pushed pt into flexion with hips and knees flexed, then pt pushed into extension with assistance as tolerated -10 reps  Pt performed trunk stabilization and LE strengthening - sitting in partial squat position with back against pool wall - pt held bar bell in each hand and pushed bar bells forward and back (scapular protraction and retraction) 8 reps prior to fatigue in legs  Ai Chi posture (Enclosing) performed with pt positioned with his back against pool wall for assist with balance and stabilization - 7 reps prior to fatigue: pt able to take short rest break and complete the set with 3 more reps performed   Pt requires buoyancy for support with balance and viscosity for resistance for strengthening exercises; buoyancy is also needed for off loading his body to assist with initiation of hip and knee flexion;  Buoyancy of water also provides support of body in supine for spasticity reduction                                  PT Short Term Goals - 06/01/19 2055      PT SHORT TERM GOAL #1   Title  Patient will demonstrate independence with updated HEP    Time  4    Period  Weeks    Status  Revised    Target Date  06/01/19   target date extended due to pt on hold due to DVT in LUE     PT SHORT TERM GOAL #2   Title  Pt will improve gait velocity with RW to >/= 0.8 ft/sec with decreased use of UE to lift and swing RLE through    Baseline  declined to .44 ft/sec following Botox    Time  4    Period  Weeks    Status  Revised    Target Date  06/01/19      PT SHORT TERM GOAL #3   Title  Pt will improve BERG to >/= 33/56    Baseline  29/56    Time  4    Period  Weeks    Status  Revised    Target Date  06/01/19      PT SHORT TERM GOAL #4   Title  Pt will ambulate x 100' over indoor surfaces with RW and Bioness or AFO MOD I with 2 standing rest breaks and improved R hip and knee flexion and foot clearance without  lifting through UE    Baseline  5-6 standing rest breaks to ambulate x 40' with RW and Bioness    Time  4    Period  Weeks    Status  Revised  Target Date  06/01/19      PT SHORT TERM GOAL #5   Title  Pt will negotate 8 stairs with two rails step to sequence with supervision    Time  4    Period  Weeks    Status  Revised    Target Date  06/01/19        PT Long Term Goals - 06/01/19 2055      PT LONG TERM GOAL #1   Title  Pt will demonstrate independence with land and aquatic HEP and will report going to [facility] 1-2x/week for ongoing fitness  (ALL LTG set for 90 days; after 12/4 please revise STG to 60 days out - 07/02/2019)    Time  12    Period  Weeks    Status  New      PT LONG TERM GOAL #2   Title  Pt will improve gait velocity with RW to >/= 1.8 ft/sec    Time  12    Period  Weeks    Status  Revised      PT LONG TERM GOAL #3   Title  Pt will improve BERG to >/= 37/56 to decrease falls risk    Time  12    Period  Weeks    Status  Revised      PT LONG TERM GOAL #4   Title  Pt will ambulate x 200' outside over pavement and grass with supervision and LRAD to improve safety ambulating to wood working shop at home    Time  12    Period  Weeks    Status  Revised      PT LONG TERM GOAL #5   Title  Pt will negotiate 8 stairs with 2 rails with alternating sequence with supervision    Time  12    Period  Weeks    Status  Revised            Plan - 06/01/19 2049    Clinical Impression Statement  Pt able to amb. in 4' water depth approx. 54m across pool with bil. UE support on pool noodle prior to having to stop and rest and without either knee hyperextending.  Pt did fatigue after this distance of 65m, requiring standing rest period.  Pt continues to be able to actively flex Lt knee approx  7 reps prior to fatigue due to decreased muscle endurance.  Pt had much difficulty maintaining balance with walking in 3' water depth near end of session due to c/o fatigue in bil.  legs.    Comorbidities  multiple falls, vitamin D insufficiency, Type 1 IDDM, diabetic retinopathy, hypothyroidism, HTN, hyperlipidemia, R tibia fracture, L ankle fracture with ORIF    PT Treatment/Interventions  ADLs/Self Care Home Management;Aquatic Therapy;Electrical Stimulation;DME Instruction;Gait training;Stair training;Functional mobility training;Therapeutic activities;Therapeutic exercise;Balance training;Neuromuscular re-education;Patient/family education;Orthotic Fit/Training;Passive range of motion;Energy conservation    PT Next Visit Plan  Use Bioness on R ant tib, R quad to oppose hamstring spasticity; also use on hamstring for strengthening.  Maintained squat position/lunge/R foot on ball roll outs.  Endurance on [employer].  Continue to focus on core/postural control training without UE support, hip flexion and knee flexion strengthening, ankle DF strength - sitting on therapy ball.  SLS and weight shift training (rockerboard)    Consulted and Agree with Plan of Care  Patient       Patient will benefit from skilled therapeutic intervention in order to improve the following deficits and impairments:  Abnormal gait, Decreased balance, Decreased endurance, Decreased coordination, Decreased range of motion, Decreased strength, Difficulty walking, Impaired tone, Postural dysfunction, Decreased activity tolerance  Visit Diagnosis: Other abnormalities of gait and mobility  Muscle weakness (generalized)  Unsteadiness on feet     Problem List Patient Active Problem List   Diagnosis Date Noted  . Vitamin D insufficiency 11/29/2016  . Hypogonadism in male 11/01/2016  . Type 1 diabetes mellitus ([place])   . Hypothyroidism   . Hypertension   . Hyperlipidemia   . Closed fracture of right fibula and tibia 10/29/2016  . Closed tibia fracture 10/29/2016  . Closed fracture of right tibial plafond with fibula involvement 10/29/2016  . Abnormal liver function tests 03/06/2013  . RASH AND OTHER  NONSPECIFIC SKIN ERUPTION 10/07/2009  . SHOULDER PAIN, LEFT 12/27/2008  . Type I (juvenile type) diabetes mellitus without mention of complication, uncontrolled 12/03/2007  . HYPOTHYROIDISM 11/12/2007  . HYPERCHOLESTEROLEMIA 11/12/2007  . MULTIPLE SCLEROSIS 08/29/2007  . PROLIFERATIVE DIABETIC RETINOPATHY 08/29/2007  . HYPERTENSION 12/25/2006    [place], [place], [place] 06/01/2019, 8:57 PM  [facility] [address] [place], [place], [address] Phone: [phone]   Fax:  [phone]  Name: Johnathan Ross MRN: [id number] Date of Birth: April 08, 1971

## 2019-06-03 ENCOUNTER — Ambulatory Visit: Payer: Medicare Other | Admitting: Physical Therapy

## 2019-06-04 ENCOUNTER — Ambulatory Visit: Payer: Self-pay | Admitting: *Deleted

## 2019-06-04 ENCOUNTER — Other Ambulatory Visit: Payer: Self-pay | Admitting: Family

## 2019-06-04 NOTE — Telephone Encounter (Signed)
Summary: swelling in arm-history of blood clot- pt not with caller    Patient's sister Johnathan Ross, on [place], calling to report swelling in patient's left arm where patient previously had a blood clot. Patient not with caller at time of call. Requesting to speak with nurse for advice.     Call to patient's sister: Patient is presently on blood thinner- he is having swelling in his arm in same area as previous blood clot. No pain noted per sister. Patient's cell number given as number to contact him on- left message for him to return call. Message also left on home number in chart.

## 2019-06-04 NOTE — Telephone Encounter (Signed)
Contacted pt's sister [name]; she is not currently with the pt; she says the pt is able to speak on his on behalf; she also said that she spoke with another RN who was to have called the pt; see previous nurse triage note; will attempt to contact pt.

## 2019-06-04 NOTE — Telephone Encounter (Signed)
Contacted pt to discuss his symptoms; he says his family is saying there is swelling in his arm;  the pt says there have been no changes to his left arm,  and there have been no changes since he was diagnosed, and placed on blood thinners; he denies pain, heat, redness, tenderness, and fever; pt advised to call back if he experiences any of the previously listed symptoms; he did inquire about the need to refill his Xarelto; pt advised that he should continue medication, and follow up with his MD as previously ordered; he verbalized understanding; the pt sees Jodi Mourning, [place]; will route to office for notification    Reason for Disposition . [1] Follow-up call to recent contact AND [2] information only call, no triage required  Answer Assessment - Initial Assessment Questions 1. REASON FOR CALL or QUESTION: "What is your reason for calling today?" or "How can I best help you?" or "What question do you have that I can help answer?"   arm swelling per pt's sister  Protocols used: INFORMATION ONLY CALL - NO TRIAGE-A-AH

## 2019-06-05 ENCOUNTER — Other Ambulatory Visit: Payer: Self-pay | Admitting: Family

## 2019-06-05 ENCOUNTER — Ambulatory Visit: Payer: Medicare Other | Admitting: Physical Therapy

## 2019-06-05 NOTE — Telephone Encounter (Signed)
Spoke with patient today and info given. 

## 2019-06-05 NOTE — Telephone Encounter (Signed)
Medication: rivaroxaban (XARELTO) 20 MG TABS tablet [id number]   Has the patient contacted their pharmacy? Yes  (Agent: If no, request that the patient contact the pharmacy for the refill.) (Agent: If yes, when and what did the pharmacy advise?)  Preferred Pharmacy (with phone number or street name): [id number] - [place], [place] [id number] [place] [id number] Phone: [phone] Fax: [phone] Open 24 hours    Agent: Please be advised that RX refills may take up to 3 business days. We ask that you follow-up with your pharmacy.

## 2019-06-05 NOTE — Telephone Encounter (Signed)
He needs to call his vascular provider. I am not managing this condition for him.

## 2019-06-05 NOTE — Telephone Encounter (Signed)
Per the OV note from vascular provider in November- "I  would recommend anticoagulation for 3-6 months.  After completing this course, he could come off of medication and undergo hypercoagulable work-up which would determine whether or not anticoagulation needed to be restarted, or he could just continue with an aspirin.  At this time, no vascular intervention is recommended.  If he does develop swelling in the future, I would consider venography."  He needs to call them to get clarification on what they want to do.

## 2019-06-05 NOTE — Telephone Encounter (Signed)
Requested medication (s) are due for refill today:  Not sure  Requested medication (s) are on the active medication list:  Yes  Future visit scheduled:   Yes   Last ordered: 02/25/2019  #30  2 refills         Requested Prescriptions  Pending Prescriptions Disp Refills   rivaroxaban (XARELTO) 20 MG TABS tablet 30 tablet 2    Sig: Take 1 tablet (20 mg total) by mouth daily with supper.      Hematology: Anticoagulants - rivaroxaban Passed - 06/05/2019 12:00 PM      Passed - ALT in normal range and within 180 days    ALT  Date Value Ref Range Status  05/14/2019 22 0 - 53 U/L Final          Passed - AST in normal range and within 180 days    AST  Date Value Ref Range Status  05/14/2019 24 0 - 37 U/L Final          Passed - Cr in normal range and within 360 days    Creatinine, Ser  Date Value Ref Range Status  05/14/2019 1.18 0.40 - 1.50 mg/dL Final   Creatinine,U  Date Value Ref Range Status  12/23/2017 182.8 mg/dL Final          Passed - HCT in normal range and within 360 days    HCT  Date Value Ref Range Status  02/21/2019 47.3 39.0 - 52.0 % Final          Passed - HGB in normal range and within 360 days    Hemoglobin  Date Value Ref Range Status  02/21/2019 15.4 13.0 - 17.0 g/dL Final          Passed - PLT in normal range and within 360 days    Platelets  Date Value Ref Range Status  02/21/2019 278 150 - 400 K/uL Final          Passed - Valid encounter within last 12 months    Recent Outpatient Visits           3 months ago Acute deep vein thrombosis (DVT) of upper extremity, unspecified laterality, unspecified vein ([place])   [place], Marvis Repress, FNP   3 months ago Flu vaccine need   [place], Marvis Repress, [place]       Future Appointments             In 8 months Valere Dross, Marvis Repress, [place] at [facility]

## 2019-06-10 ENCOUNTER — Encounter: Payer: Self-pay | Admitting: Physical Therapy

## 2019-06-10 ENCOUNTER — Other Ambulatory Visit: Payer: Self-pay

## 2019-06-10 ENCOUNTER — Ambulatory Visit: Payer: Medicare Other | Admitting: Physical Therapy

## 2019-06-10 DIAGNOSIS — M6281 Muscle weakness (generalized): Secondary | ICD-10-CM | POA: Diagnosis not present

## 2019-06-10 DIAGNOSIS — M21371 Foot drop, right foot: Secondary | ICD-10-CM | POA: Diagnosis not present

## 2019-06-10 DIAGNOSIS — R296 Repeated falls: Secondary | ICD-10-CM

## 2019-06-10 DIAGNOSIS — R2689 Other abnormalities of gait and mobility: Secondary | ICD-10-CM | POA: Diagnosis not present

## 2019-06-10 DIAGNOSIS — R262 Difficulty in walking, not elsewhere classified: Secondary | ICD-10-CM | POA: Diagnosis not present

## 2019-06-10 DIAGNOSIS — R2681 Unsteadiness on feet: Secondary | ICD-10-CM

## 2019-06-10 NOTE — Therapy (Signed)
[facility] [address] [place], [place], [address] Phone: [phone]   Fax:  [phone]  Physical Therapy Treatment  Patient Details  Name: Johnathan Ross MRN: [id number] Date of Birth: 1970/08/17 Referring Provider (PT): Carylon Perches, MD   Encounter Date: 06/10/2019  PT End of Session - 06/10/19 0858    Visit Number  22    Number of Visits  35    Date for PT Re-Evaluation  07/02/19    Authorization Type  Medicare Part A and B; 10th visit PN    PT Start Time  0853    PT Stop Time  0935    PT Time Calculation (min)  42 min    Equipment Utilized During Treatment  --   flotation belt and pool noodle; aquatic ankle cuffs   Activity Tolerance  Patient tolerated treatment well;Patient limited by fatigue   limited by spasticity   Behavior During Therapy  WFL for tasks assessed/performed       Past Medical History:  Diagnosis Date  . Hyperlipidemia   . Hypertension   . Hypogonadism in male 11/01/2016  . Hypothyroidism   . Multiple sclerosis ([place])   . 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 Type 1 diabetes mellitus ([place]) dx'd 1994  . Vitamin D insufficiency 11/29/2016    Past Surgical History:  Procedure Laterality Date  . EYE SURGERY Bilateral    "laser for diabetic retinopathy"  . FRACTURE SURGERY    . OPEN REDUCTION INTERNAL FIXATION (ORIF) TIBIA/FIBULA FRACTURE Right 10/30/2016  . ORIF ANKLE FRACTURE Left 2015  . ORIF TIBIA FRACTURE Right 10/30/2016   Procedure: OPEN REDUCTION INTERNAL FIXATION (ORIF) TIBIA FIBULA FRACTURE;  Surgeon: Altamese Bouton, MD;  Location: [facility];  Service: Orthopedics;  Laterality: Right;  . RETINAL LASER PROCEDURE Bilateral    "for diabetic retinopathy"    There were no vitals filed for this visit.  Subjective Assessment - 06/10/19 0855    Subjective  Had a dr appt - was told the botox may have caused weakness of the hip flexors which impacted ability to overcome  extensor tone, but pt should get back to previous level of mobility once it wears off.  Is going to send him to another physician to discuss possibility of baclofen pump.  Physician also asked him about scooter but pt did not want to pursue at this time.    Patient is accompained by:  --    Pertinent History  multiple falls, vitamin D insufficiency, Type 1 IDDM, diabetic retinopathy, hypothyroidism, HTN, hyperlipidemia, R tibia fracture, L ankle fracture with ORIF    Limitations  Walking    Diagnostic tests  No new lesions seen on MRI    Patient Stated Goals  To improve walking    Currently in Pain?  No/denies                       [facility] Adult PT Treatment/Exercise - 06/10/19 0907      Transfers   Transfers  Sit to Stand;Stand to Sit;Floor to Transfer    Sit to Stand  5: Supervision;With upper extremity assist;From chair/3-in-1;From bed    Stand to Sit  5: Supervision;With upper extremity assist;To bed;To chair/3-in-1      Ambulation/Gait   Ambulation/Gait  Yes    Ambulation/Gait Assistance  4: Min guard    Ambulation/Gait Assistance Details  with Bioness in gait mode and therapist providing cues and facilitation for closed chain DF on R  during stance phase and active hip and knee flexion > knee extension during swing phase.      Ambulation Distance (Feet)  30 Feet   30'x2   Assistive device  Rolling walker    Gait Pattern  Step-to pattern;Decreased step length - right;Decreased step length - left;Decreased hip/knee flexion - right;Decreased dorsiflexion - right;Right genu recurvatum;Poor foot clearance - right    Ambulation Surface  Indoor;Level      Therapeutic Activites    Therapeutic Activities  Other Therapeutic Activities    Other Therapeutic Activities  Discussed with pt physiology of muscle contraction, how botox works and influences therapy, and reviewed the dr's note. Explained the difference between tone and spasticity. Discussed the effect of last botox  injection on quads - causing weakness inhibiting ability to overcome HS extensor tone.       Knee/Hip Exercises: Machines for Strengthening   Cybex Leg Press  for strengthening & muscle re-ed: BLE 70# x10 reps LE extension with on times, controlled hip/knee flexion on rest. Cues for full ROM, not locking out knees in extension. Therapist assist for knee control.       Knee/Hip Exercises: Standing   Other Standing Knee Exercises  for strengthening & muscle re-ed: With intermittent UE support on RW: mini-squats with time on, rest with off times. MinA for safety and assist at R knee to prevent buckling into excessive, uncontrolled flexion. x12 reps. Repeated with 2" step under LLE to facilitate greater wt shift over RLE. minA for safety and to assist at pelvis for wt shift. x7, x5 reps     Other Standing Knee Exercises  for strengthening & muscle re-ed: With UE support at RW: red tband resisted R hip flexion with on time, rest with off time. X5 reps. Repeated resisted R hip flexion with on time, L hip flexion/knee flexion with off time. X5 reps. Therapist assist at pelvis for stability and wt shift. Pt demonstrated difficulty with R hip flexion and was unable to flex the knee d/t quad weakness and significant HS extensor tone.       Acupuncturist Location  R anterior tib and R [employer]  for increased quad strengthening    Electrical Stimulation Parameters  Tablet 2; steering electrodes    Electrical Stimulation Goals  Strength;Neuromuscular facilitation               PT Short Term Goals - 06/10/19 1136      PT SHORT TERM GOAL #1   Title  Patient will demonstrate independence with updated HEP    Time  4    Period  Weeks    Status  Deferred    Target Date  06/01/19   target date extended due to pt on hold due to DVT in LUE     PT SHORT TERM GOAL #2   Title  Pt will improve gait velocity with RW to >/= 0.8 ft/sec with  decreased use of UE to lift and swing RLE through    Baseline  declined to .44 ft/sec following Botox    Time  4    Period  Weeks    Status  Deferred    Target Date  06/01/19      PT SHORT TERM GOAL #3   Title  Pt will improve BERG to >/= 33/56    Baseline  29/56    Time  4    Period  Weeks    Status  Deferred    Target Date  06/01/19      PT SHORT TERM GOAL #4   Title  Pt will ambulate x 100' over indoor surfaces with RW and Bioness or AFO MOD I with 2 standing rest breaks and improved R hip and knee flexion and foot clearance without lifting through UE    Baseline  5-6 standing rest breaks to ambulate x 40' with RW and Bioness    Time  4    Period  Weeks    Status  Deferred    Target Date  06/01/19      PT SHORT TERM GOAL #5   Title  Pt will negotate 8 stairs with two rails step to sequence with supervision    Time  4    Period  Weeks    Status  Deferred    Target Date  06/01/19        PT Long Term Goals - 06/01/19 2055      PT LONG TERM GOAL #1   Title  Pt will demonstrate independence with land and aquatic HEP and will report going to [facility] 1-2x/week for ongoing fitness  (ALL LTG set for 90 days; after 12/4 please revise STG to 60 days out - 07/02/2019)    Time  12    Period  Weeks    Status  New      PT LONG TERM GOAL #2   Title  Pt will improve gait velocity with RW to >/= 1.8 ft/sec    Time  12    Period  Weeks    Status  Revised      PT LONG TERM GOAL #3   Title  Pt will improve BERG to >/= 37/56 to decrease falls risk    Time  12    Period  Weeks    Status  Revised      PT LONG TERM GOAL #4   Title  Pt will ambulate x 200' outside over pavement and grass with supervision and LRAD to improve safety ambulating to wood working shop at home    Time  12    Period  Weeks    Status  Revised      PT LONG TERM GOAL #5   Title  Pt will negotiate 8 stairs with 2 rails with alternating sequence with supervision    Time  12    Period  Weeks    Status  Revised             Plan - 06/10/19 0859    Clinical Impression Statement  PT session focused on LE strengthening and NMR utilizing Bioness. Deferred STG assessment as pt continues to demonstrate significant weakness in R quad d/t botox injection that is unable to overcome significant extensor tone in the hamstrings. Pt requires assist for closed chain DF, assist at the R knee for knee flexion during swing and stability in stance to prevent buckling. Pt enjoyed using leg press today - will continue to incorporate in future session. Focused on facilitating wt shift over to RLE in stance during exercises. Pt can benefit from continued skilled therapy services to address deficits.    Comorbidities  multiple falls, vitamin D insufficiency, Type 1 IDDM, diabetic retinopathy, hypothyroidism, HTN, hyperlipidemia, R tibia fracture, L ankle fracture with ORIF    PT Treatment/Interventions  ADLs/Self Care Home Management;Aquatic Therapy;Electrical Stimulation;DME Instruction;Gait training;Stair training;Functional mobility training;Therapeutic activities;Therapeutic exercise;Balance training;Neuromuscular re-education;Patient/family education;Orthotic Fit/Training;Passive range of motion;Energy conservation    PT Next  Visit Plan  Use Bioness on R ant tib, R quad to oppose hamstring spasticity; also use on hamstring for strengthening.  Maintained squat position/lunge/R foot on ball roll outs.  Endurance on [employer].  Continue to focus on core/postural control training without UE support, hip flexion and knee flexion strengthening, ankle DF strength - sitting on therapy ball.  SLS and weight shift training (rockerboard). leg press with bioness    Consulted and Agree with Plan of Care  Patient       Patient will benefit from skilled therapeutic intervention in order to improve the following deficits and impairments:  Abnormal gait, Decreased balance, Decreased endurance, Decreased coordination, Decreased range of motion,  Decreased strength, Difficulty walking, Impaired tone, Postural dysfunction, Decreased activity tolerance  Visit Diagnosis: Other abnormalities of gait and mobility  Muscle weakness (generalized)  Unsteadiness on feet  Repeated falls  Difficulty in walking, not elsewhere classified     Problem List Patient Active Problem List   Diagnosis Date Noted  . Vitamin D insufficiency 11/29/2016  . Hypogonadism in male 11/01/2016  . Type 1 diabetes mellitus ([place])   . Hypothyroidism   . Hypertension   . Hyperlipidemia   . Closed fracture of right fibula and tibia 10/29/2016  . Closed tibia fracture 10/29/2016  . Closed fracture of right tibial plafond with fibula involvement 10/29/2016  . Abnormal liver function tests 03/06/2013  . RASH AND OTHER NONSPECIFIC SKIN ERUPTION 10/07/2009  . SHOULDER PAIN, LEFT 12/27/2008  . Type I (juvenile type) diabetes mellitus without mention of complication, uncontrolled 12/03/2007  . HYPOTHYROIDISM 11/12/2007  . HYPERCHOLESTEROLEMIA 11/12/2007  . MULTIPLE SCLEROSIS 08/29/2007  . PROLIFERATIVE DIABETIC RETINOPATHY 08/29/2007  . HYPERTENSION 12/25/2006    [name] SPT  06/10/2019, 2:34 PM  [facility] [address] [place], [place], [address] Phone: [phone]   Fax:  [phone]  Name: Johnathan Ross MRN: [id number] Date of Birth: Mar 21, 1971

## 2019-06-11 ENCOUNTER — Other Ambulatory Visit: Payer: Self-pay

## 2019-06-11 ENCOUNTER — Telehealth: Payer: Self-pay

## 2019-06-11 DIAGNOSIS — E1065 Type 1 diabetes mellitus with hyperglycemia: Secondary | ICD-10-CM

## 2019-06-11 MED ORDER — CONTOUR NEXT TEST VI STRP: ORAL_STRIP | 3 refills | Status: DC

## 2019-06-11 NOTE — Telephone Encounter (Signed)
Rx sent 

## 2019-06-11 NOTE — Telephone Encounter (Signed)
MEDICATION: CONTOUR NEXT TEST test strip  PHARMACY:  [id number] - [place], [place] - [address] OF [address]  IS THIS A 90 DAY SUPPLY :   IS PATIENT OUT OF MEDICATION:   IF NOT; HOW MUCH IS LEFT:   LAST APPOINTMENT DATE: @12 /24/2020  NEXT APPOINTMENT DATE:@3 /16/2021  DO WE HAVE YOUR PERMISSION TO LEAVE A DETAILED MESSAGE:  OTHER COMMENTS:    **Let patient know to contact pharmacy at the end of the day to make sure medication is ready. **  ** Please notify patient to allow 48-72 hours to process**  **Encourage patient to contact the pharmacy for refills or they can request refills through [facility]**

## 2019-06-12 ENCOUNTER — Other Ambulatory Visit: Payer: Self-pay

## 2019-06-12 ENCOUNTER — Ambulatory Visit: Payer: Medicare Other | Admitting: Physical Therapy

## 2019-06-12 ENCOUNTER — Encounter: Payer: Self-pay | Admitting: Physical Therapy

## 2019-06-12 DIAGNOSIS — M6281 Muscle weakness (generalized): Secondary | ICD-10-CM | POA: Diagnosis not present

## 2019-06-12 DIAGNOSIS — R296 Repeated falls: Secondary | ICD-10-CM | POA: Diagnosis not present

## 2019-06-12 DIAGNOSIS — M21371 Foot drop, right foot: Secondary | ICD-10-CM | POA: Diagnosis not present

## 2019-06-12 DIAGNOSIS — R2689 Other abnormalities of gait and mobility: Secondary | ICD-10-CM

## 2019-06-12 DIAGNOSIS — R262 Difficulty in walking, not elsewhere classified: Secondary | ICD-10-CM | POA: Diagnosis not present

## 2019-06-12 DIAGNOSIS — R2681 Unsteadiness on feet: Secondary | ICD-10-CM | POA: Diagnosis not present

## 2019-06-12 NOTE — Therapy (Signed)
[facility] [address] [place], [place], [address] Phone: [phone]   Fax:  [phone]  Physical Therapy Treatment  Patient Details  Name: Johnathan Ross MRN: [id number] Date of Birth: [birth year] Referring Provider (PT): Carylon Perches, MD   Encounter Date: 06/12/2019  PT End of Session - 06/12/19 0859    Visit Number  23    Number of Visits  35    Date for PT Re-Evaluation  07/02/19    Authorization Type  Medicare Part A and B; 10th visit PN    PT Start Time  0855    PT Stop Time  0935    PT Time Calculation (min)  40 min    Equipment Utilized During Treatment  --   flotation belt and pool noodle; aquatic ankle cuffs   Activity Tolerance  Patient tolerated treatment well;Patient limited by fatigue   limited by spasticity   Behavior During Therapy  WFL for tasks assessed/performed       Past Medical History:  Diagnosis Date  . Hyperlipidemia   . Hypertension   . Hypogonadism in male 11/01/2016  . Hypothyroidism   . Multiple sclerosis ([place])   . 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 Type 1 diabetes mellitus ([place]) dx'd 1994  . Vitamin D insufficiency 11/29/2016    Past Surgical History:  Procedure Laterality Date  . EYE SURGERY Bilateral    "laser for diabetic retinopathy"  . FRACTURE SURGERY    . OPEN REDUCTION INTERNAL FIXATION (ORIF) TIBIA/FIBULA FRACTURE Right 10/30/2016  . ORIF ANKLE FRACTURE Left 2015  . ORIF TIBIA FRACTURE Right 10/30/2016   Procedure: OPEN REDUCTION INTERNAL FIXATION (ORIF) TIBIA FIBULA FRACTURE;  Surgeon: Altamese [place], MD;  Location: [facility];  Service: Orthopedics;  Laterality: Right;  . RETINAL LASER PROCEDURE Bilateral    "for diabetic retinopathy"    There were no vitals filed for this visit.  Subjective Assessment - 06/12/19 0858    Subjective  No changes, questions or concerns at this time. No falls or pain.    Pertinent History  multiple falls, vitamin D  insufficiency, Type 1 IDDM, diabetic retinopathy, hypothyroidism, HTN, hyperlipidemia, R tibia fracture, L ankle fracture with ORIF    Limitations  Walking    Diagnostic tests  No new lesions seen on MRI    Patient Stated Goals  To improve walking    Currently in Pain?  No/denies                       [facility] Adult PT Treatment/Exercise - 06/12/19 0901      Transfers   Transfers  Sit to Stand;Stand to Sit;Floor to Transfer    Sit to Stand  5: Supervision;With upper extremity assist;From chair/3-in-1;From bed    Stand to Sit  5: Supervision;With upper extremity assist;To bed;To chair/3-in-1      Ambulation/Gait   Ambulation/Gait  Yes    Ambulation/Gait Assistance  4: Min guard    Ambulation/Gait Assistance Details  with Bioness in gait mode and therapist providing cues and facilitation for closed chain DF on R during stance phase and active hip and knee flexion > knee extension during swing phase.      Ambulation Distance (Feet)  30 Feet   x2   Assistive device  Rolling walker    Gait Pattern  Step-to pattern;Decreased step length - right;Decreased step length - left;Decreased hip/knee flexion - right;Decreased dorsiflexion - right;Right genu recurvatum;Poor foot clearance - right  Ambulation Surface  Indoor;Level    Gait Comments  Pt entered clinic requiring multiple standing breaks from lobby to mat table. Demonstrated significant use of bilateral UE to "hop" with RW.       Knee/Hip Exercises: Stretches   Active Hamstring Stretch  Right;Limitations    Active Hamstring Stretch Limitations  Hooklying/SLR position with active quad set during on times, rest during off times. Therapist passive HS assist throughout. Multiples bouts of sustained stretch.       Knee/Hip Exercises: Machines for Strengthening   Cybex Leg Press  for strengthening & muscle re-ed: BLE 100# x10 reps, RLE only 50# x10 reps.  LE extension with on times, controlled hip/knee flexion on rest. Cues for  full ROM, not locking out knees in extension. Therapist assist for knee control.       Knee/Hip Exercises: Supine   Other Supine Knee/Hip Exercises  In supine with hips/knees flexion to 90-90 supported by physio ball, pt performed R<>L hip rotation with cues for core activation and stabilization x10 reps. Pt performed modified bicycles with therpist assist for R hip/knee flexion to overcome HS extensor tone and gentle adductor stretch when in extension. Cues for form and core activation.     Other Supine Knee/Hip Exercises  for strengthening and muscle re-ed: with heels on physioball, bridge with on times and HS curl (hip/knee flexion) during off times 2x5 reps. Therpist assist for ball control and to support RLE in extension.    10s on-10s off      Electrical Stimulation   Electrical Stimulation Location  R anterior tib and R quad    Electrical Stimulation Action  for increased quad strengthening    Electrical Stimulation Parameters  Tablet 2; steering electrodes    Electrical Stimulation Goals  Strength;Neuromuscular facilitation               PT Short Term Goals - 06/10/19 1136      PT SHORT TERM GOAL #1   Title  Patient will demonstrate independence with updated HEP    Time  4    Period  Weeks    Status  Deferred    Target Date  06/01/19   target date extended due to pt on hold due to DVT in LUE     PT SHORT TERM GOAL #2   Title  Pt will improve gait velocity with RW to >/= 0.8 ft/sec with decreased use of UE to lift and swing RLE through    Baseline  declined to .44 ft/sec following Botox    Time  4    Period  Weeks    Status  Deferred    Target Date  06/01/19      PT SHORT TERM GOAL #3   Title  Pt will improve BERG to >/= 33/56    Baseline  29/56    Time  4    Period  Weeks    Status  Deferred    Target Date  06/01/19      PT SHORT TERM GOAL #4   Title  Pt will ambulate x 100' over indoor surfaces with RW and Bioness or AFO MOD I with 2 standing rest breaks and  improved R hip and knee flexion and foot clearance without lifting through UE    Baseline  5-6 standing rest breaks to ambulate x 40' with RW and Bioness    Time  4    Period  Weeks    Status  Deferred    Target  Date  06/01/19      PT SHORT TERM GOAL #5   Title  Pt will negotate 8 stairs with two rails step to sequence with supervision    Time  4    Period  Weeks    Status  Deferred    Target Date  06/01/19        PT Long Term Goals - 06/01/19 2055      PT LONG TERM GOAL #1   Title  Pt will demonstrate independence with land and aquatic HEP and will report going to [facility] 1-2x/week for ongoing fitness  (ALL LTG set for 90 days; after 12/4 please revise STG to 60 days out - 07/02/2019)    Time  12    Period  Weeks    Status  New      PT LONG TERM GOAL #2   Title  Pt will improve gait velocity with RW to >/= 1.8 ft/sec    Time  12    Period  Weeks    Status  Revised      PT LONG TERM GOAL #3   Title  Pt will improve BERG to >/= 37/56 to decrease falls risk    Time  12    Period  Weeks    Status  Revised      PT LONG TERM GOAL #4   Title  Pt will ambulate x 200' outside over pavement and grass with supervision and LRAD to improve safety ambulating to wood working shop at home    Time  12    Period  Weeks    Status  Revised      PT LONG TERM GOAL #5   Title  Pt will negotiate 8 stairs with 2 rails with alternating sequence with supervision    Time  12    Period  Weeks    Status  Revised            Plan - 06/12/19 0859    Clinical Impression Statement  Pt continues to present with extensor tone/spasticity and R quad weakness. When ambulating from clinic lobby to tx mat table he required intermittent standing rest breaks x3 and required significant UE support > progressing to "hopping" gait. Using bioness, pt able to perform repetitive hip/knee flexion and activate bilateral gluteals and hamstrings. Assist to prevent terminal knee extension, reduces intensity of  extensor tone response. Pt demonstrated good knee control when using leg press today requiring minimal assist. Pt may benefit from continued skilled therapy services to address deficits.    Comorbidities  multiple falls, vitamin D insufficiency, Type 1 IDDM, diabetic retinopathy, hypothyroidism, HTN, hyperlipidemia, R tibia fracture, L ankle fracture with ORIF    PT Treatment/Interventions  ADLs/Self Care Home Management;Aquatic Therapy;Electrical Stimulation;DME Instruction;Gait training;Stair training;Functional mobility training;Therapeutic activities;Therapeutic exercise;Balance training;Neuromuscular re-education;Patient/family education;Orthotic Fit/Training;Passive range of motion;Energy conservation    PT Next Visit Plan  Use Bioness on R ant tib, R quad to oppose hamstring spasticity; also use on hamstring for strengthening.  Bioness with leg press with 90lb bilat LE, 50lb with single leg.  Endurance on [employer].  Quadruped core strengthening.  Continue to focus on core/postural control training without UE support in standing, hip flexion and knee flexion strengthening, SLS and weight shift training (rockerboard)    Consulted and Agree with Plan of Care  Patient       Patient will benefit from skilled therapeutic intervention in order to improve the following deficits and impairments:  Abnormal gait, Decreased  balance, Decreased endurance, Decreased coordination, Decreased range of motion, Decreased strength, Difficulty walking, Impaired tone, Postural dysfunction, Decreased activity tolerance  Visit Diagnosis: Other abnormalities of gait and mobility  Muscle weakness (generalized)  Unsteadiness on feet  Repeated falls     Problem List Patient Active Problem List   Diagnosis Date Noted  . Vitamin D insufficiency 11/29/2016  . Hypogonadism in male 11/01/2016  . Type 1 diabetes mellitus ([place])   . Hypothyroidism   . Hypertension   . Hyperlipidemia   . Closed fracture of right  fibula and tibia 10/29/2016  . Closed tibia fracture 10/29/2016  . Closed fracture of right tibial plafond with fibula involvement 10/29/2016  . Abnormal liver function tests 03/06/2013  . RASH AND OTHER NONSPECIFIC SKIN ERUPTION 10/07/2009  . SHOULDER PAIN, LEFT 12/27/2008  . Type I (juvenile type) diabetes mellitus without mention of complication, uncontrolled 12/03/2007  . HYPOTHYROIDISM 11/12/2007  . HYPERCHOLESTEROLEMIA 11/12/2007  . MULTIPLE SCLEROSIS 08/29/2007  . PROLIFERATIVE DIABETIC RETINOPATHY 08/29/2007  . HYPERTENSION 12/25/2006    [name] SPT 06/12/2019, 10:34 AM  [facility] [address] [place], [place], [address] Phone: [phone]   Fax:  [phone]  Name: Johnathan Ross MRN: [id number] Date of Birth: 09-Feb-1971

## 2019-06-14 ENCOUNTER — Other Ambulatory Visit: Payer: Self-pay | Admitting: Endocrinology

## 2019-06-17 ENCOUNTER — Other Ambulatory Visit: Payer: Self-pay

## 2019-06-17 ENCOUNTER — Encounter: Payer: Self-pay | Admitting: Physical Therapy

## 2019-06-17 ENCOUNTER — Ambulatory Visit: Payer: Medicare Other | Admitting: Physical Therapy

## 2019-06-17 DIAGNOSIS — M6281 Muscle weakness (generalized): Secondary | ICD-10-CM

## 2019-06-17 DIAGNOSIS — R2681 Unsteadiness on feet: Secondary | ICD-10-CM | POA: Diagnosis not present

## 2019-06-17 DIAGNOSIS — R2689 Other abnormalities of gait and mobility: Secondary | ICD-10-CM | POA: Diagnosis not present

## 2019-06-17 DIAGNOSIS — R262 Difficulty in walking, not elsewhere classified: Secondary | ICD-10-CM | POA: Diagnosis not present

## 2019-06-17 DIAGNOSIS — M21371 Foot drop, right foot: Secondary | ICD-10-CM | POA: Diagnosis not present

## 2019-06-17 DIAGNOSIS — R296 Repeated falls: Secondary | ICD-10-CM

## 2019-06-17 DIAGNOSIS — M21372 Foot drop, left foot: Secondary | ICD-10-CM

## 2019-06-17 NOTE — Therapy (Signed)
[facility] [address] [place], [place], [address] Phone: [phone]   Fax:  [phone]  Physical Therapy Treatment  Patient Details  Name: Johnathan Ross MRN: [id number] Date of Birth: 10/15/1970 Referring Provider (PT): Carylon Perches, MD   Encounter Date: 06/17/2019  PT End of Session - 06/17/19 0851    Visit Number  24    Number of Visits  35    Date for PT Re-Evaluation  07/02/19    Authorization Type  Medicare Part A and B; 10th visit PN    PT Start Time  0847    PT Stop Time  0935    PT Time Calculation (min)  48 min    Equipment Utilized During Treatment  --    Activity Tolerance  Patient tolerated treatment well;Patient limited by fatigue   limited by spasticity   Behavior During Therapy  [facility] for tasks assessed/performed       Past Medical History:  Diagnosis Date  . Hyperlipidemia   . Hypertension   . Hypogonadism in male 11/01/2016  . Hypothyroidism   . Multiple sclerosis ([place])   . 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 Type 1 diabetes mellitus ([place]) dx'd 1994  . Vitamin D insufficiency 11/29/2016    Past Surgical History:  Procedure Laterality Date  . EYE SURGERY Bilateral    "laser for diabetic retinopathy"  . FRACTURE SURGERY    . OPEN REDUCTION INTERNAL FIXATION (ORIF) TIBIA/FIBULA FRACTURE Right 10/30/2016  . ORIF ANKLE FRACTURE Left 2015  . ORIF TIBIA FRACTURE Right 10/30/2016   Procedure: OPEN REDUCTION INTERNAL FIXATION (ORIF) TIBIA FIBULA FRACTURE;  Surgeon: Altamese Latimer, MD;  Location: [facility];  Service: Orthopedics;  Laterality: Right;  . RETINAL LASER PROCEDURE Bilateral    "for diabetic retinopathy"    There were no vitals filed for this visit.  Subjective Assessment - 06/17/19 0851    Subjective  Has not heard update on scheduling MRI. Some UE soreness after last session that resolved in a day.    Pertinent History  multiple falls, vitamin D insufficiency, Type 1  IDDM, diabetic retinopathy, hypothyroidism, HTN, hyperlipidemia, R tibia fracture, L ankle fracture with ORIF    Limitations  Walking    Diagnostic tests  No new lesions seen on MRI    Patient Stated Goals  To improve walking    Currently in Pain?  No/denies                       [facility] Adult PT Treatment/Exercise - 06/17/19 0941      Transfers   Transfers  Sit to Stand;Stand to Sit;Floor to Transfer    Sit to Stand  5: Supervision;With upper extremity assist;From bed    Stand to Sit  5: Supervision;With upper extremity assist;To bed      Ambulation/Gait   Ambulation/Gait  Yes    Ambulation/Gait Assistance  4: Min guard    Ambulation/Gait Assistance Details  pt in crouched position with Bioness in gait mode and therapist providing cues and facilitation for closed chain DF on R during stance phase and active hip and knee flexion > knee extension during swing phase for x30', therapist providing cues for facilitation for wt shift R and active L hip/knee flexion for limb advancement x30'. Assist to maneuver upwalker.     Ambulation Distance (Feet)  60 Feet    Assistive device  Other (Comment)   UP Walker   Gait Pattern  Step-to pattern;Decreased  step length - right;Decreased step length - left;Decreased hip/knee flexion - right;Decreased dorsiflexion - right;Right genu recurvatum;Poor foot clearance - right    Ambulation Surface  Indoor;Level      Neuro Re-ed    Neuro Re-ed Details   In tall kneeling with intermittent UE support concurrent with bioness in training mode: mini squats x10 reps pt demonstrated dec wt shift R, mini squats to R x10 reps, mini squats with OH press 5# ball x10 reps, mini squat to L & R with diagonal chop 5# ball x5 reps each, fwd/retro walk 2 steps x10 reps each pt demosntrated dec R pelvis rotation anteriorly, performed wt shift R with RUE reach meeting therapist resistance to facilitate R wt shift and R hip extension x10 reps.      Psychologist, counselling Location  R anterior tib and R Research officer, [employer]  for increased quad strengthening    Electrical Stimulation Parameters  Tablet 2; steering electrodes    Electrical Stimulation Goals  Strength;Neuromuscular facilitation               PT Short Term Goals - 06/10/19 1136      PT SHORT TERM GOAL #1   Title  Patient will demonstrate independence with updated HEP    Time  4    Period  Weeks    Status  Deferred    Target Date  06/01/19   target date extended due to pt on hold due to DVT in LUE     PT SHORT TERM GOAL #2   Title  Pt will improve gait velocity with RW to >/= 0.8 ft/sec with decreased use of UE to lift and swing RLE through    Baseline  declined to .44 ft/sec following Botox    Time  4    Period  Weeks    Status  Deferred    Target Date  06/01/19      PT SHORT TERM GOAL #3   Title  Pt will improve BERG to >/= 33/56    Baseline  29/56    Time  4    Period  Weeks    Status  Deferred    Target Date  06/01/19      PT SHORT TERM GOAL #4   Title  Pt will ambulate x 100' over indoor surfaces with RW and Bioness or AFO MOD I with 2 standing rest breaks and improved R hip and knee flexion and foot clearance without lifting through UE    Baseline  5-6 standing rest breaks to ambulate x 40' with RW and Bioness    Time  4    Period  Weeks    Status  Deferred    Target Date  06/01/19      PT SHORT TERM GOAL #5   Title  Pt will negotate 8 stairs with two rails step to sequence with supervision    Time  4    Period  Weeks    Status  Deferred    Target Date  06/01/19        PT Long Term Goals - 06/01/19 2055      PT LONG TERM GOAL #1   Title  Pt will demonstrate independence with land and aquatic HEP and will report going to [facility] 1-2x/week for ongoing fitness  (ALL LTG set for 90 days; after 12/4 please revise STG to 60 days out - 07/02/2019)    Time  12  Period  Weeks    Status  New      PT LONG TERM  GOAL #2   Title  Pt will improve gait velocity with RW to >/= 1.8 ft/sec    Time  12    Period  Weeks    Status  Revised      PT LONG TERM GOAL #3   Title  Pt will improve BERG to >/= 37/56 to decrease falls risk    Time  12    Period  Weeks    Status  Revised      PT LONG TERM GOAL #4   Title  Pt will ambulate x 200' outside over pavement and grass with supervision and LRAD to improve safety ambulating to wood working shop at home    Time  12    Period  Weeks    Status  Revised      PT LONG TERM GOAL #5   Title  Pt will negotiate 8 stairs with 2 rails with alternating sequence with supervision    Time  12    Period  Weeks    Status  Revised            Plan - 06/17/19 [id number]    Clinical Impression Statement  Continued proximal strengthening in tall kneeling concurrent with bioness training progressing from BUE > intermittent UE support > no UE support. Pt demonstrates dec weight shift to R - focused on facilitating WB through RLE and lateral hip stability. Pt continues to demonstrate significant weakness and spasticity limiting limb advancement. He demonstrated improved gait with facilitation but continues to demonstrate difficulty with LE advancement and clearance. Will continue to progress towards functional goals. Pt may benefit from continued skilled services to address deficits.    Comorbidities  multiple falls, vitamin D insufficiency, Type 1 IDDM, diabetic retinopathy, hypothyroidism, HTN, hyperlipidemia, R tibia fracture, L ankle fracture with ORIF    PT Treatment/Interventions  ADLs/Self Care Home Management;Aquatic Therapy;Electrical Stimulation;DME Instruction;Gait training;Stair training;Functional mobility training;Therapeutic activities;Therapeutic exercise;Balance training;Neuromuscular re-education;Patient/family education;Orthotic Fit/Training;Passive range of motion;Energy conservation    PT Next Visit Plan  Use Bioness on R ant tib, R quad to oppose hamstring  spasticity; also use on hamstring for strengthening.  Bioness with leg press with 90lb bilat LE, 50lb with single leg.  Endurance on [employer].  Quadruped core strengthening.  Continue to focus on core/postural control training without UE support in standing, hip flexion and knee flexion strengthening, SLS and weight shift training (rockerboard). tall kneeling for proximal strengthenign    Consulted and Agree with Plan of Care  Patient       Patient will benefit from skilled therapeutic intervention in order to improve the following deficits and impairments:  Abnormal gait, Decreased balance, Decreased endurance, Decreased coordination, Decreased range of motion, Decreased strength, Difficulty walking, Impaired tone, Postural dysfunction, Decreased activity tolerance  Visit Diagnosis: Other abnormalities of gait and mobility  Muscle weakness (generalized)  Unsteadiness on feet  Repeated falls  Difficulty in walking, not elsewhere classified  Foot drop, right  Foot drop, left     Problem List Patient Active Problem List   Diagnosis Date Noted  . Vitamin D insufficiency 11/29/2016  . Hypogonadism in male 11/01/2016  . Type 1 diabetes mellitus ([place])   . Hypothyroidism   . Hypertension   . Hyperlipidemia   . Closed fracture of right fibula and tibia 10/29/2016  . Closed tibia fracture 10/29/2016  . Closed fracture of right tibial plafond with fibula  involvement 10/29/2016  . Abnormal liver function tests 03/06/2013  . RASH AND OTHER NONSPECIFIC SKIN ERUPTION 10/07/2009  . SHOULDER PAIN, LEFT 12/27/2008  . Type I (juvenile type) diabetes mellitus without mention of complication, uncontrolled 12/03/2007  . HYPOTHYROIDISM 11/12/2007  . HYPERCHOLESTEROLEMIA 11/12/2007  . MULTIPLE SCLEROSIS 08/29/2007  . PROLIFERATIVE DIABETIC RETINOPATHY 08/29/2007  . HYPERTENSION 12/25/2006    [name] SPT 06/17/2019, 9:43 AM  [facility] [address] [place], [place], [address] Phone: [phone]   Fax:  [phone]  Name: Johnathan Ross MRN: [id number] Date of Birth: 1970/11/13

## 2019-06-19 ENCOUNTER — Other Ambulatory Visit: Payer: Self-pay

## 2019-06-19 ENCOUNTER — Ambulatory Visit: Payer: Medicare Other | Admitting: Physical Therapy

## 2019-06-19 ENCOUNTER — Encounter: Payer: Self-pay | Admitting: Physical Therapy

## 2019-06-19 DIAGNOSIS — R2689 Other abnormalities of gait and mobility: Secondary | ICD-10-CM

## 2019-06-19 DIAGNOSIS — M6281 Muscle weakness (generalized): Secondary | ICD-10-CM

## 2019-06-19 DIAGNOSIS — M21371 Foot drop, right foot: Secondary | ICD-10-CM | POA: Diagnosis not present

## 2019-06-19 DIAGNOSIS — R262 Difficulty in walking, not elsewhere classified: Secondary | ICD-10-CM | POA: Diagnosis not present

## 2019-06-19 DIAGNOSIS — R296 Repeated falls: Secondary | ICD-10-CM

## 2019-06-19 DIAGNOSIS — R2681 Unsteadiness on feet: Secondary | ICD-10-CM | POA: Diagnosis not present

## 2019-06-19 NOTE — Therapy (Signed)
[facility] [address] [place], [place], [address] Phone: [phone]   Fax:  [phone]  Physical Therapy Treatment  Patient Details  Name: Johnathan Ross MRN: [id number] Date of Birth: February 16, 1971 Referring Provider (PT): Carylon Perches, MD   Encounter Date: 06/19/2019  PT End of Session - 06/19/19 0853    Visit Number  25    Number of Visits  35    Date for PT Re-Evaluation  07/02/19    Authorization Type  Medicare Part A and B; 10th visit PN    PT Start Time  0850    PT Stop Time  0940    PT Time Calculation (min)  50 min    Activity Tolerance  Patient tolerated treatment well;Patient limited by fatigue   limited by spasticity   Behavior During Therapy  [facility] for tasks assessed/performed       Past Medical History:  Diagnosis Date  . Hyperlipidemia   . Hypertension   . Hypogonadism in male 11/01/2016  . Hypothyroidism   . Multiple sclerosis ([place])   . 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 Type 1 diabetes mellitus ([place]) dx'd 1994  . Vitamin D insufficiency 11/29/2016    Past Surgical History:  Procedure Laterality Date  . EYE SURGERY Bilateral    "laser for diabetic retinopathy"  . FRACTURE SURGERY    . OPEN REDUCTION INTERNAL FIXATION (ORIF) TIBIA/FIBULA FRACTURE Right 10/30/2016  . ORIF ANKLE FRACTURE Left 2015  . ORIF TIBIA FRACTURE Right 10/30/2016   Procedure: OPEN REDUCTION INTERNAL FIXATION (ORIF) TIBIA FIBULA FRACTURE;  Surgeon: Altamese Nunda, MD;  Location: [facility];  Service: Orthopedics;  Laterality: Right;  . RETINAL LASER PROCEDURE Bilateral    "for diabetic retinopathy"    There were no vitals filed for this visit.  Subjective Assessment - 06/19/19 0853    Subjective  No changes or concerns at this time.    Pertinent History  multiple falls, vitamin D insufficiency, Type 1 IDDM, diabetic retinopathy, hypothyroidism, HTN, hyperlipidemia, R tibia fracture, L ankle fracture with  ORIF    Limitations  Walking    Diagnostic tests  No new lesions seen on MRI    Patient Stated Goals  To improve walking    Currently in Pain?  No/denies                       [facility] Adult PT Treatment/Exercise - 06/19/19 0900      Transfers   Transfers  Sit to Stand;Stand to Sit;Floor to Transfer    Sit to Stand  5: Supervision;With upper extremity assist;From bed    Stand to Sit  5: Supervision;With upper extremity assist;To bed      Ambulation/Gait   Ambulation/Gait  Yes    Ambulation/Gait Assistance  4: Min guard    Assistive device  Rolling walker    Gait Pattern  Step-to pattern;Decreased step length - right;Decreased step length - left;Decreased hip/knee flexion - right;Decreased dorsiflexion - right;Right genu recurvatum;Poor foot clearance - right    Ambulation Surface  Indoor;Level    Gait Comments  Ambulated to counter & back, therapist assist to facilitate closed chain DF on R during stance phase and active hip/knee flexion during swing for limb advancment       Neuro Re-ed    Neuro Re-ed Details   Quadruped: Wt shifting posterior with on times, anterior with off time x10 reps. Glute kickbacks, RLE with on times, LLE with off times 2x4  reps. Modified bird dogs with UE raise during on times x10 reps. Modified nordic HS with UE support on physioball, roll out w/on times x10 reps, repeated with unilateral UE raise RUE > LUE x10 reps. For core stability and HS activation.       Knee/Hip Exercises: Stretches   Passive Hamstring Stretch  Right;Left;Limitations    Passive Hamstring Stretch Limitations  Hooklying/SLR position therapist passive HS assist throughout. Multiples bouts of sustained stretch.       Acupuncturist Location  R anterior tib and R HS    Electrical Stimulation Action  to fatigue hamstrings     Electrical Stimulation Parameters  Tablet 2; steering electrodes    Electrical Stimulation Goals  Neuromuscular  facilitation;Tone          Balance Exercises - 06/19/19 0959      Balance Exercises: Standing   Rockerboard  Anterior/posterior;EO;10 reps;UE support   posterior wt shift with on times    Other Standing Exercises  mini-squats on rockerboard with on times x10 reps           PT Short Term Goals - 06/10/19 1136      PT SHORT TERM GOAL #1   Title  Patient will demonstrate independence with updated HEP    Time  4    Period  Weeks    Status  Deferred    Target Date  06/01/19   target date extended due to pt on hold due to DVT in LUE     PT SHORT TERM GOAL #2   Title  Pt will improve gait velocity with RW to >/= 0.8 ft/sec with decreased use of UE to lift and swing RLE through    Baseline  declined to .44 ft/sec following Botox    Time  4    Period  Weeks    Status  Deferred    Target Date  06/01/19      PT SHORT TERM GOAL #3   Title  Pt will improve BERG to >/= 33/56    Baseline  29/56    Time  4    Period  Weeks    Status  Deferred    Target Date  06/01/19      PT SHORT TERM GOAL #4   Title  Pt will ambulate x 100' over indoor surfaces with RW and Bioness or AFO MOD I with 2 standing rest breaks and improved R hip and knee flexion and foot clearance without lifting through UE    Baseline  5-6 standing rest breaks to ambulate x 40' with RW and Bioness    Time  4    Period  Weeks    Status  Deferred    Target Date  06/01/19      PT SHORT TERM GOAL #5   Title  Pt will negotate 8 stairs with two rails step to sequence with supervision    Time  4    Period  Weeks    Status  Deferred    Target Date  06/01/19        PT Long Term Goals - 06/01/19 2055      PT LONG TERM GOAL #1   Title  Pt will demonstrate independence with land and aquatic HEP and will report going to [facility] 1-2x/week for ongoing fitness  (ALL LTG set for 90 days; after 12/4 please revise STG to 60 days out - 07/02/2019)    Time  12  Period  Weeks    Status  New      PT LONG TERM GOAL #2    Title  Pt will improve gait velocity with RW to >/= 1.8 ft/sec    Time  12    Period  Weeks    Status  Revised      PT LONG TERM GOAL #3   Title  Pt will improve BERG to >/= 37/56 to decrease falls risk    Time  12    Period  Weeks    Status  Revised      PT LONG TERM GOAL #4   Title  Pt will ambulate x 200' outside over pavement and grass with supervision and LRAD to improve safety ambulating to wood working shop at home    Time  12    Period  Weeks    Status  Revised      PT LONG TERM GOAL #5   Title  Pt will negotiate 8 stairs with 2 rails with alternating sequence with supervision    Time  12    Period  Weeks    Status  Revised            Plan - 06/19/19 [id number]    Clinical Impression Statement  PT session focused on NMR utilizing Bioness for R DF strengthening and R HS strengthening to fatigue in order to improve RLE advancement during ambulation and dec workload on BUE. Pt demonstrated core instability and posterior chain weakness with quadruped exercises requiring intermittent CGA to steady. He has difficulty with minor knee flexion during sustain standing balance d/t significant extensor tone.  Will continue to progress towards functional goals.    Comorbidities  multiple falls, vitamin D insufficiency, Type 1 IDDM, diabetic retinopathy, hypothyroidism, HTN, hyperlipidemia, R tibia fracture, L ankle fracture with ORIF    PT Treatment/Interventions  ADLs/Self Care Home Management;Aquatic Therapy;Electrical Stimulation;DME Instruction;Gait training;Stair training;Functional mobility training;Therapeutic activities;Therapeutic exercise;Balance training;Neuromuscular re-education;Patient/family education;Orthotic Fit/Training;Passive range of motion;Energy conservation    PT Next Visit Plan  Use Bioness on R ant tib, R quad to oppose hamstring spasticity; also use on hamstring for strengthening.  Bioness with leg press with 90lb bilat LE, 50lb with single leg.  Endurance on [employer].  Quadruped core strengthening.  Continue to focus on core/postural control training without UE support in standing, hip flexion and knee flexion strengthening, SLS and weight shift training (rockerboard). tall kneeling for proximal strengthenign    Consulted and Agree with Plan of Care  Patient       Patient will benefit from skilled therapeutic intervention in order to improve the following deficits and impairments:  Abnormal gait, Decreased balance, Decreased endurance, Decreased coordination, Decreased range of motion, Decreased strength, Difficulty walking, Impaired tone, Postural dysfunction, Decreased activity tolerance  Visit Diagnosis: Other abnormalities of gait and mobility  Muscle weakness (generalized)  Unsteadiness on feet  Repeated falls  Difficulty in walking, not elsewhere classified     Problem List Patient Active Problem List   Diagnosis Date Noted  . Vitamin D insufficiency 11/29/2016  . Hypogonadism in male 11/01/2016  . Type 1 diabetes mellitus ([place])   . Hypothyroidism   . Hypertension   . Hyperlipidemia   . Closed fracture of right fibula and tibia 10/29/2016  . Closed tibia fracture 10/29/2016  . Closed fracture of right tibial plafond with fibula involvement 10/29/2016  . Abnormal liver function tests 03/06/2013  . RASH AND OTHER NONSPECIFIC SKIN ERUPTION 10/07/2009  . SHOULDER PAIN,  LEFT 12/27/2008  . Type I (juvenile type) diabetes mellitus without mention of complication, uncontrolled 12/03/2007  . HYPOTHYROIDISM 11/12/2007  . HYPERCHOLESTEROLEMIA 11/12/2007  . MULTIPLE SCLEROSIS 08/29/2007  . PROLIFERATIVE DIABETIC RETINOPATHY 08/29/2007  . HYPERTENSION 12/25/2006    [name] SPT 06/19/2019, 10:07 AM  [facility] [address] [place], [place], [address] Phone: [phone]   Fax:  [phone]  Name: RAEDEN PASKINS MRN: [id number] Date of Birth: 1971/01/14

## 2019-06-22 ENCOUNTER — Ambulatory Visit: Payer: Medicare Other | Admitting: Physical Therapy

## 2019-06-22 ENCOUNTER — Other Ambulatory Visit: Payer: Self-pay

## 2019-06-22 DIAGNOSIS — M6281 Muscle weakness (generalized): Secondary | ICD-10-CM | POA: Diagnosis not present

## 2019-06-22 DIAGNOSIS — R2689 Other abnormalities of gait and mobility: Secondary | ICD-10-CM | POA: Diagnosis not present

## 2019-06-22 DIAGNOSIS — R2681 Unsteadiness on feet: Secondary | ICD-10-CM | POA: Diagnosis not present

## 2019-06-22 DIAGNOSIS — M21371 Foot drop, right foot: Secondary | ICD-10-CM | POA: Diagnosis not present

## 2019-06-22 DIAGNOSIS — R296 Repeated falls: Secondary | ICD-10-CM | POA: Diagnosis not present

## 2019-06-22 DIAGNOSIS — R262 Difficulty in walking, not elsewhere classified: Secondary | ICD-10-CM | POA: Diagnosis not present

## 2019-06-23 ENCOUNTER — Encounter: Payer: Self-pay | Admitting: Physical Therapy

## 2019-06-23 NOTE — Therapy (Signed)
[facility] [address] [place], [place], [address] Phone: [phone]   Fax:  [phone]  Physical Therapy Treatment  Patient Details  Name: Johnathan Ross MRN: [id number] Date of Birth: [birth year] Referring Provider (PT): Carylon Perches, MD   Encounter Date: 06/22/2019  PT End of Session - 06/23/19 1609    Visit Number  26    Number of Visits  35    Date for PT Re-Evaluation  07/02/19    Authorization Type  Medicare Part A and B; 10th visit PN    PT Start Time  1330    PT Stop Time  1415    PT Time Calculation (min)  45 min    Equipment Utilized During Treatment  --   pool noodle, floatation belt & aquatic cuffs   Activity Tolerance  Patient tolerated treatment well;Patient limited by fatigue   limited by spasticity   Behavior During Therapy  [facility] for tasks assessed/performed       Past Medical History:  Diagnosis Date  . Hyperlipidemia   . Hypertension   . Hypogonadism in male 11/01/2016  . Hypothyroidism   . Multiple sclerosis ([place])   . 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 Type 1 diabetes mellitus ([place]) dx'd 1994  . Vitamin D insufficiency 11/29/2016    Past Surgical History:  Procedure Laterality Date  . EYE SURGERY Bilateral    "laser for diabetic retinopathy"  . FRACTURE SURGERY    . OPEN REDUCTION INTERNAL FIXATION (ORIF) TIBIA/FIBULA FRACTURE Right 10/30/2016  . ORIF ANKLE FRACTURE Left 2015  . ORIF TIBIA FRACTURE Right 10/30/2016   Procedure: OPEN REDUCTION INTERNAL FIXATION (ORIF) TIBIA FIBULA FRACTURE;  Surgeon: Altamese Follansbee, MD;  Location: [facility];  Service: Orthopedics;  Laterality: Right;  . RETINAL LASER PROCEDURE Bilateral    "for diabetic retinopathy"    There were no vitals filed for this visit.  Subjective Assessment - 06/23/19 1608    Subjective  Pt presents for aquatic therapy at [facility] - no new problems reported    Patient is accompained by:  Family member   parents    Pertinent History  multiple falls, vitamin D insufficiency, Type 1 IDDM, diabetic retinopathy, hypothyroidism, HTN, hyperlipidemia, R tibia fracture, L ankle fracture with ORIF    Limitations  Walking    Diagnostic tests  No new lesions seen on MRI    Patient Stated Goals  To improve walking    Currently in Pain?  No/denies         Aquatic therapy at [facility] - pool temp 87.2 degrees  Patient seen for aquatic therapy today.  Treatment took place in water 3.5-4 feet deep depending upon activity.  Pt entered and exited the pool via step negotiation step by step sequence with use of bil. hand rails with supervision:  SBA with step ascension at end of session  with pt using a step over step sequence to ascend the 4 steps for exiting pool  Runner's stretch each leg 30 sec hold at beginning of session for hamstring & gastroc stretch;   Pt performed gait training in pool with bil. UE support on pool noodle stabilized by PT - forwards 21m x 2 reps with pt stopping twice during this distance due to fatigue and due to knee hyperextension   Pt performed stepping exercise for fascilitation of knee control on stance leg - stepping forward/back and out/in 10 reps each direction with each leg for improved knee control on stance leg -  bil. UE support on pool edge for assist with balance  Pt performed standing hip flexion, extension and abduction 10 reps each leg with cue initially to keep stance leg slighty flexed at knee to prevent excessive hyperextension - buoyant ankle cuff used for resistance with the eccentric contraction Squats 10 reps with pt holding onto side of pool; unilateral squats 10 reps each leg with pt holding onto pool edge with bil. UE support   Pt in supine position - flotation belt used for increased buoyancy and flotation; bicycling legs was attempted but difficult for pt to perform due to difficulty initiating flexion of hips & knees;  Pt placed both feet on pool wall (still in  supine position) and was assisted with hip and knee flexion by PT pushing pt toward wall while feet were stabilized on wall - pt pushed away from wall for closed chain strengthening 10 reps with assist of PT  Pt sat on bench in pool - performed LAQ 10 reps each leg  - pt able to actively flex each knee 10 reps    Pt requires buoyancy for support with balance and viscosity for resistance for strengthening exercises; buoyancy is also needed for off loading his body to assist with initiation of hip and knee flexion;  Buoyancy of water also provides support of body in supine for spasticity reduction                                  PT Short Term Goals - 06/23/19 1618      PT SHORT TERM GOAL #1   Title  Patient will demonstrate independence with updated HEP    Time  4    Period  Weeks    Status  Deferred    Target Date  06/01/19   target date extended due to pt on hold due to DVT in LUE     PT SHORT TERM GOAL #2   Title  Pt will improve gait velocity with RW to >/= 0.8 ft/sec with decreased use of UE to lift and swing RLE through    Baseline  declined to .44 ft/sec following Botox    Time  4    Period  Weeks    Status  Deferred    Target Date  06/01/19      PT SHORT TERM GOAL #3   Title  Pt will improve BERG to >/= 33/56    Baseline  29/56    Time  4    Period  Weeks    Status  Deferred    Target Date  06/01/19      PT SHORT TERM GOAL #4   Title  Pt will ambulate x 100' over indoor surfaces with RW and Bioness or AFO MOD I with 2 standing rest breaks and improved R hip and knee flexion and foot clearance without lifting through UE    Baseline  5-6 standing rest breaks to ambulate x 40' with RW and Bioness    Time  4    Period  Weeks    Status  Deferred    Target Date  06/01/19      PT SHORT TERM GOAL #5   Title  Pt will negotate 8 stairs with two rails step to sequence with supervision    Time  4    Period  Weeks    Status  Deferred    Target  Date  06/01/19  PT Long Term Goals - 06/23/19 1618      PT LONG TERM GOAL #1   Title  Pt will demonstrate independence with land and aquatic HEP and will report going to [facility] 1-2x/week for ongoing fitness  (ALL LTG set for 90 days; after 12/4 please revise STG to 60 days out - 07/02/2019)    Time  12    Period  Weeks    Status  New      PT LONG TERM GOAL #2   Title  Pt will improve gait velocity with RW to >/= 1.8 ft/sec    Time  12    Period  Weeks    Status  Revised      PT LONG TERM GOAL #3   Title  Pt will improve BERG to >/= 37/56 to decrease falls risk    Time  12    Period  Weeks    Status  Revised      PT LONG TERM GOAL #4   Title  Pt will ambulate x 200' outside over pavement and grass with supervision and LRAD to improve safety ambulating to wood working shop at home    Time  12    Period  Weeks    Status  Revised      PT LONG TERM GOAL #5   Title  Pt will negotiate 8 stairs with 2 rails with alternating sequence with supervision    Time  12    Period  Weeks    Status  Revised            Plan - 06/23/19 1611    Clinical Impression Statement  Pt performed water walking with use of pool noodle for bil. UE support; pt amb. 63m across pool but c/o shoulder pain after approx. 42m amb. distance, due to pt pushing down on noodle to assist with balance and stabilization in attempting to prevent knee hyperextension on RLE & LLE.  Pt needed 2 short standing rest breaks during first 33m amb. distance in pool at start of session.  Pt able actively flex Rt and Lt knee in seated position from LAQ exercise for approx. 7-8 reps prior to fatigue due to decreased muscle endurance of hamstrings.  Pt unable to perform hip and knee flexion in supine position in water (as in bicycling LE's) due to extensor spasticity.  Minimal changes in functional status noted.    Comorbidities  multiple falls, vitamin D insufficiency, Type 1 IDDM, diabetic retinopathy, hypothyroidism, HTN,  hyperlipidemia, R tibia fracture, L ankle fracture with ORIF    PT Frequency  2x / week    PT Duration  12 weeks    PT Treatment/Interventions  ADLs/Self Care Home Management;Aquatic Therapy;Electrical Stimulation;DME Instruction;Gait training;Stair training;Functional mobility training;Therapeutic activities;Therapeutic exercise;Balance training;Neuromuscular re-education;Patient/family education;Orthotic Fit/Training;Passive range of motion;Energy conservation    PT Next Visit Plan  Use Bioness on R ant tib, R quad to oppose hamstring spasticity; also use on hamstring for strengthening.  Bioness with leg press with 90lb bilat LE, 50lb with single leg.  Endurance on [employer].  Quadruped core strengthening.  Continue to focus on core/postural control training without UE support in standing, hip flexion and knee flexion strengthening, SLS and weight shift training (rockerboard). tall kneeling for proximal strengthenign    Consulted and Agree with Plan of Care  Patient       Patient will benefit from skilled therapeutic intervention in order to improve the following deficits and impairments:  Abnormal gait, Decreased balance,  Decreased endurance, Decreased coordination, Decreased range of motion, Decreased strength, Difficulty walking, Impaired tone, Postural dysfunction, Decreased activity tolerance  Visit Diagnosis: Other abnormalities of gait and mobility  Muscle weakness (generalized)  Unsteadiness on feet     Problem List Patient Active Problem List   Diagnosis Date Noted  . Vitamin D insufficiency 11/29/2016  . Hypogonadism in male 11/01/2016  . Type 1 diabetes mellitus ([place])   . Hypothyroidism   . Hypertension   . Hyperlipidemia   . Closed fracture of right fibula and tibia 10/29/2016  . Closed tibia fracture 10/29/2016  . Closed fracture of right tibial plafond with fibula involvement 10/29/2016  . Abnormal liver function tests 03/06/2013  . RASH AND OTHER NONSPECIFIC SKIN  ERUPTION 10/07/2009  . SHOULDER PAIN, LEFT 12/27/2008  . Type I (juvenile type) diabetes mellitus without mention of complication, uncontrolled 12/03/2007  . HYPOTHYROIDISM 11/12/2007  . HYPERCHOLESTEROLEMIA 11/12/2007  . MULTIPLE SCLEROSIS 08/29/2007  . PROLIFERATIVE DIABETIC RETINOPATHY 08/29/2007  . HYPERTENSION 12/25/2006    [place], PT, [place] 06/23/2019, 4:21 PM  [facility] [address] [place], [place], [address] Phone: [phone]   Fax:  [phone]  Name: Johnathan Ross MRN: [id number] Date of Birth: 12-30-1970

## 2019-06-24 ENCOUNTER — Ambulatory Visit: Payer: Medicare Other | Admitting: Physical Therapy

## 2019-06-26 ENCOUNTER — Other Ambulatory Visit: Payer: Self-pay

## 2019-06-26 ENCOUNTER — Ambulatory Visit: Payer: Medicare Other | Admitting: Physical Therapy

## 2019-06-26 ENCOUNTER — Encounter: Payer: Self-pay | Admitting: Physical Therapy

## 2019-06-26 DIAGNOSIS — R262 Difficulty in walking, not elsewhere classified: Secondary | ICD-10-CM | POA: Diagnosis not present

## 2019-06-26 DIAGNOSIS — M6281 Muscle weakness (generalized): Secondary | ICD-10-CM | POA: Diagnosis not present

## 2019-06-26 DIAGNOSIS — R2689 Other abnormalities of gait and mobility: Secondary | ICD-10-CM

## 2019-06-26 DIAGNOSIS — R2681 Unsteadiness on feet: Secondary | ICD-10-CM | POA: Diagnosis not present

## 2019-06-26 DIAGNOSIS — R296 Repeated falls: Secondary | ICD-10-CM

## 2019-06-26 DIAGNOSIS — M21371 Foot drop, right foot: Secondary | ICD-10-CM | POA: Diagnosis not present

## 2019-06-27 NOTE — Therapy (Signed)
[facility] [address] [place], [place], [address] Phone: [phone]   Fax:  [phone]  Physical Therapy Treatment  Patient Details  Name: Johnathan Ross MRN: [id number] Date of Birth: March 01, 1971 Referring Provider (PT): Carylon Perches, MD   Encounter Date: 06/26/2019  PT End of Session - 06/27/19 1644    Visit Number  27    Number of Visits  35    Date for PT Re-Evaluation  07/02/19    Authorization Type  Medicare Part A and B; 10th visit PN    PT Start Time  0847    PT Stop Time  0932    PT Time Calculation (min)  45 min    Equipment Utilized During Treatment  --    Activity Tolerance  Patient tolerated treatment well   limited by spasticity   Behavior During Therapy  [facility] for tasks assessed/performed       Past Medical History:  Diagnosis Date  . Hyperlipidemia   . Hypertension   . Hypogonadism in male 11/01/2016  . Hypothyroidism   . Multiple sclerosis ([place])   . 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 Type 1 diabetes mellitus ([place]) dx'd 1994  . Vitamin D insufficiency 11/29/2016    Past Surgical History:  Procedure Laterality Date  . EYE SURGERY Bilateral    "laser for diabetic retinopathy"  . FRACTURE SURGERY    . OPEN REDUCTION INTERNAL FIXATION (ORIF) TIBIA/FIBULA FRACTURE Right 10/30/2016  . ORIF ANKLE FRACTURE Left 2015  . ORIF TIBIA FRACTURE Right 10/30/2016   Procedure: OPEN REDUCTION INTERNAL FIXATION (ORIF) TIBIA FIBULA FRACTURE;  Surgeon: Altamese Columbiana, MD;  Location: [facility];  Service: Orthopedics;  Laterality: Right;  . RETINAL LASER PROCEDURE Bilateral    "for diabetic retinopathy"    There were no vitals filed for this visit.                    [facility] Adult PT Treatment/Exercise - 06/26/19 0905      Ambulation/Gait   Ambulation/Gait  Yes    Ambulation/Gait Assistance  5: Supervision;4: Min guard    Ambulation/Gait Assistance Details  Pt continues to  demonstrate increased difficulty with advancing each LE without increased extensor tone and continues to resort to lifting through UE and swinging bilat LE through    Assistive device  Rolling walker    Ambulation Surface  Level;Indoor      Exercises   Exercises  Other Exercises    Other Exercises   With Bioness in training mode performed 2 sets x 10 reps for hamstring and core strengthening: hamstring curls with bilat LE on physioball with therapist preventing excessive RLE external rotation and alternating hip and knee flexion off of physioball while contralateral LE stabilizes on the physioball.  Cued pt to add in chin tuck and trunk flexion with hamstring curls to decrease extensor tone/activation in LE.  With feet off physioball performed bridges with UE holding physioball performing UE extension overhead while Bioness is on and hips are bridging up; with Bioness off pt performed trunk flexion/crunch to lift ball up and over knees x 10 reps with Bioness in training mode.  Transitioned to prone and placed bilat knees in flexion.  With Bioness in training mode performed one set x 10 reps each: resisted hamstring curls and active assisted hip extension with knee flexed.        Modalities   Modalities  Holiday representative  Electrical Stimulation Location  R anterior tib and R HS    Electrical Stimulation Action  open and closed chain ankle DF, hip extension and knee flexion during mat exercises    Electrical Stimulation Parameters  Tablet 2; steering electrodes    Electrical Stimulation Goals  Strength;Tone;Neuromuscular facilitation               PT Short Term Goals - 06/23/19 1618      PT SHORT TERM GOAL #1   Title  Patient will demonstrate independence with updated HEP    Time  4    Period  Weeks    Status  Deferred    Target Date  06/01/19   target date extended due to pt on hold due to DVT in LUE     PT SHORT TERM GOAL #2   Title  Pt will  improve gait velocity with RW to >/= 0.8 ft/sec with decreased use of UE to lift and swing RLE through    Baseline  declined to .44 ft/sec following Botox    Time  4    Period  Weeks    Status  Deferred    Target Date  06/01/19      PT SHORT TERM GOAL #3   Title  Pt will improve BERG to >/= 33/56    Baseline  29/56    Time  4    Period  Weeks    Status  Deferred    Target Date  06/01/19      PT SHORT TERM GOAL #4   Title  Pt will ambulate x 100' over indoor surfaces with RW and Bioness or AFO MOD I with 2 standing rest breaks and improved R hip and knee flexion and foot clearance without lifting through UE    Baseline  5-6 standing rest breaks to ambulate x 40' with RW and Bioness    Time  4    Period  Weeks    Status  Deferred    Target Date  06/01/19      PT SHORT TERM GOAL #5   Title  Pt will negotate 8 stairs with two rails step to sequence with supervision    Time  4    Period  Weeks    Status  Deferred    Target Date  06/01/19        PT Long Term Goals - 06/23/19 1618      PT LONG TERM GOAL #1   Title  Pt will demonstrate independence with land and aquatic HEP and will report going to [facility] 1-2x/week for ongoing fitness  (ALL LTG set for 90 days; after 12/4 please revise STG to 60 days out - 07/02/2019)    Time  12    Period  Weeks    Status  New      PT LONG TERM GOAL #2   Title  Pt will improve gait velocity with RW to >/= 1.8 ft/sec    Time  12    Period  Weeks    Status  Revised      PT LONG TERM GOAL #3   Title  Pt will improve BERG to >/= 37/56 to decrease falls risk    Time  12    Period  Weeks    Status  Revised      PT LONG TERM GOAL #4   Title  Pt will ambulate x 200' outside over pavement and grass with supervision and LRAD to improve safety  ambulating to wood working shop at home    Time  12    Period  Weeks    Status  Revised      PT LONG TERM GOAL #5   Title  Pt will negotiate 8 stairs with 2 rails with alternating sequence with  supervision    Time  12    Period  Weeks    Status  Revised            Plan - 06/27/19 1645    Clinical Impression Statement  Continued to focus on use of functional electrical stimulation for NMR and strengthening of hamstrings, proximal hip mm and core for increased stability during gait and safer gait technique.  Pt demonstrated increased fatigue of LLE during exercises - pt would like to return to use of Bioness on LLE in addition to RLE.  Will continue to progress towards LTG.    Comorbidities  multiple falls, vitamin D insufficiency, Type 1 IDDM, diabetic retinopathy, hypothyroidism, HTN, hyperlipidemia, R tibia fracture, L ankle fracture with ORIF    PT Frequency  2x / week    PT Duration  12 weeks    PT Treatment/Interventions  ADLs/Self Care Home Management;Aquatic Therapy;Electrical Stimulation;DME Instruction;Gait training;Stair training;Functional mobility training;Therapeutic activities;Therapeutic exercise;Balance training;Neuromuscular re-education;Patient/family education;Orthotic Fit/Training;Passive range of motion;Energy conservation    PT Next Visit Plan  Bioness on R and LLE - hamstring strength. Sidelying modified plank; hamstring exercises with physioball. Bioness with leg press with 90lb bilat LE, 50lb with single leg.  Endurance on [employer].  Quadruped core strengthening.  Continue to focus on core/postural control training without UE support in standing, hip flexion and knee flexion strengthening, SLS and weight shift training (rockerboard). tall kneeling for proximal strengthening    Consulted and Agree with Plan of Care  Patient       Patient will benefit from skilled therapeutic intervention in order to improve the following deficits and impairments:  Abnormal gait, Decreased balance, Decreased endurance, Decreased coordination, Decreased range of motion, Decreased strength, Difficulty walking, Impaired tone, Postural dysfunction, Decreased activity  tolerance  Visit Diagnosis: Other abnormalities of gait and mobility  Muscle weakness (generalized)  Unsteadiness on feet  Repeated falls  Difficulty in walking, not elsewhere classified  Foot drop, right     Problem List Patient Active Problem List   Diagnosis Date Noted  . Vitamin D insufficiency 11/29/2016  . Hypogonadism in male 11/01/2016  . Type 1 diabetes mellitus ([place])   . Hypothyroidism   . Hypertension   . Hyperlipidemia   . Closed fracture of right fibula and tibia 10/29/2016  . Closed tibia fracture 10/29/2016  . Closed fracture of right tibial plafond with fibula involvement 10/29/2016  . Abnormal liver function tests 03/06/2013  . RASH AND OTHER NONSPECIFIC SKIN ERUPTION 10/07/2009  . SHOULDER PAIN, LEFT 12/27/2008  . Type I (juvenile type) diabetes mellitus without mention of complication, uncontrolled 12/03/2007  . HYPOTHYROIDISM 11/12/2007  . HYPERCHOLESTEROLEMIA 11/12/2007  . MULTIPLE SCLEROSIS 08/29/2007  . PROLIFERATIVE DIABETIC RETINOPATHY 08/29/2007  . HYPERTENSION 12/25/2006    [name], PT, DPT 06/27/19    4:53 PM    [facility] [address] [place], [place], [address] Phone: [phone]   Fax:  [phone]  Name: Johnathan Ross MRN: [id number] Date of Birth: 09-26-1970

## 2019-06-29 ENCOUNTER — Other Ambulatory Visit: Payer: Self-pay

## 2019-06-29 ENCOUNTER — Ambulatory Visit: Payer: Medicare Other | Attending: Urology | Admitting: Physical Therapy

## 2019-06-29 DIAGNOSIS — R262 Difficulty in walking, not elsewhere classified: Secondary | ICD-10-CM | POA: Insufficient documentation

## 2019-06-29 DIAGNOSIS — R296 Repeated falls: Secondary | ICD-10-CM | POA: Insufficient documentation

## 2019-06-29 DIAGNOSIS — M6281 Muscle weakness (generalized): Secondary | ICD-10-CM | POA: Insufficient documentation

## 2019-06-29 DIAGNOSIS — M21372 Foot drop, left foot: Secondary | ICD-10-CM | POA: Insufficient documentation

## 2019-06-29 DIAGNOSIS — R2689 Other abnormalities of gait and mobility: Secondary | ICD-10-CM | POA: Diagnosis not present

## 2019-06-29 DIAGNOSIS — M21371 Foot drop, right foot: Secondary | ICD-10-CM | POA: Insufficient documentation

## 2019-06-29 DIAGNOSIS — R2681 Unsteadiness on feet: Secondary | ICD-10-CM | POA: Insufficient documentation

## 2019-06-30 NOTE — Therapy (Signed)
[facility] [address] [place], [place], [address] Phone: [phone]   Fax:  [phone]  Physical Therapy Treatment  Patient Details  Name: Johnathan Ross MRN: [id number] Date of Birth: 1970-09-04 Referring Provider (PT): Carylon Perches, MD   Encounter Date: 06/29/2019  PT End of Session - 06/30/19 2139    Visit Number  28    Number of Visits  35    Date for PT Re-Evaluation  07/02/19    Authorization Type  Medicare Part A and B; 10th visit PN    PT Start Time  1330    PT Stop Time  1415    PT Time Calculation (min)  45 min    Equipment Utilized During Treatment  Other (comment)   floatation belt, pool noodle, ankle cuffs   Activity Tolerance  Patient tolerated treatment well   limited by spasticity   Behavior During Therapy  [facility] for tasks assessed/performed       Past Medical History:  Diagnosis Date  . Hyperlipidemia   . Hypertension   . Hypogonadism in male 11/01/2016  . Hypothyroidism   . Multiple sclerosis ([place])   . 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 Type 1 diabetes mellitus ([place]) dx'd 1994  . Vitamin D insufficiency 11/29/2016    Past Surgical History:  Procedure Laterality Date  . EYE SURGERY Bilateral    "laser for diabetic retinopathy"  . FRACTURE SURGERY    . OPEN REDUCTION INTERNAL FIXATION (ORIF) TIBIA/FIBULA FRACTURE Right 10/30/2016  . ORIF ANKLE FRACTURE Left 2015  . ORIF TIBIA FRACTURE Right 10/30/2016   Procedure: OPEN REDUCTION INTERNAL FIXATION (ORIF) TIBIA FIBULA FRACTURE;  Surgeon: Altamese [place], MD;  Location: [facility];  Service: Orthopedics;  Laterality: Right;  . RETINAL LASER PROCEDURE Bilateral    "for diabetic retinopathy"    There were no vitals filed for this visit.  Subjective Assessment - 06/30/19 2137    Subjective  Pt presents for aquatic therapy at [facility] - reports no changes overall    Patient is accompained by:  Family member   parents   Pertinent  History  multiple falls, vitamin D insufficiency, Type 1 IDDM, diabetic retinopathy, hypothyroidism, HTN, hyperlipidemia, R tibia fracture, L ankle fracture with ORIF    Limitations  Walking    Diagnostic tests  No new lesions seen on MRI    Patient Stated Goals  To improve walking    Currently in Pain?  No/denies         Aquatic therapy at [facility] - pool temp 87.6 degrees  Patient seen for aquatic therapy today.  Treatment took place in water 3.5-4 feet deep depending upon activity.  Pt entered and exited the pool via step negotiation step by step sequence with use of bil. hand rails with SBA: CGA for step ascension for exiting pool at end of session  Pt performed gait training in pool with bil. UE support on pool noodle stabilized by PT - forwards 44m x 1 rep with frequent stopping to unlock knees from hyperextended position  Squats 10 reps with pt holding onto side of pool; unilateral squats 10 reps each leg with bil. UE support on side of pool  Pt performed bil. Hip flexion, extension, abduction 10 reps each leg with use of ankle cuff for increased buoyancy and ROM concentrically and for increased resistance eccentrically   Pt performed seated LAQ's on each leg 10 reps - pt able to actively bend knees for 8 reps prior to  fatigue  Tall kneeling on pool floor with UE support on bench - squats x 10 reps; pt performed hip extension with knee flexed 10 reps each leg  Pt performed simulated leg press with pt lying supine with use of flotation belt - PT positioned pt to allow him to place bil. Feet on pool wall - PT pushed pt into flexion with hips and knees flexed, then pt pushed into extension with assistance as tolerated -10 reps  Pt performed trunk stabilization and LE strengthening - sitting in partial squat position with back against pool wall - pt held bar bell in each hand and pushed bar bells forward and back (scapular protraction and retraction) 8 reps prior to fatigue in legs  Ai Chi  posture (Enclosing) performed with pt positioned with his back against pool wall for assist with balance and stabilization - 10 reps    Pt requires buoyancy for support with balance and viscosity for resistance for strengthening exercises; buoyancy is also needed for off loading his body to assist with initiation of hip and knee flexion;  Buoyancy of water also provides support of body in supine for spasticity reduction                                             PT Short Term Goals - 06/30/19 2147      PT SHORT TERM GOAL #1   Title  Patient will demonstrate independence with updated HEP    Time  4    Period  Weeks    Status  Deferred    Target Date  06/01/19   target date extended due to pt on hold due to DVT in LUE     PT SHORT TERM GOAL #2   Title  Pt will improve gait velocity with RW to >/= 0.8 ft/sec with decreased use of UE to lift and swing RLE through    Baseline  declined to .44 ft/sec following Botox    Time  4    Period  Weeks    Status  Deferred    Target Date  06/01/19      PT SHORT TERM GOAL #3   Title  Pt will improve BERG to >/= 33/56    Baseline  29/56    Time  4    Period  Weeks    Status  Deferred    Target Date  06/01/19      PT SHORT TERM GOAL #4   Title  Pt will ambulate x 100' over indoor surfaces with RW and Bioness or AFO MOD I with 2 standing rest breaks and improved R hip and knee flexion and foot clearance without lifting through UE    Baseline  5-6 standing rest breaks to ambulate x 40' with RW and Bioness    Time  4    Period  Weeks    Status  Deferred    Target Date  06/01/19      PT SHORT TERM GOAL #5   Title  Pt will negotate 8 stairs with two rails step to sequence with supervision    Time  4    Period  Weeks    Status  Deferred    Target Date  06/01/19        PT Long Term Goals - 06/30/19 2150      PT LONG TERM GOAL #1  Title  Pt will demonstrate independence with land and aquatic HEP and  will report going to [facility] 1-2x/week for ongoing fitness  (ALL LTG set for 90 days; after 12/4 please revise STG to 60 days out - 07/02/2019)    Time  12    Period  Weeks    Status  New      PT LONG TERM GOAL #2   Title  Pt will improve gait velocity with RW to >/= 1.8 ft/sec    Time  12    Period  Weeks    Status  Revised      PT LONG TERM GOAL #3   Title  Pt will improve BERG to >/= 37/56 to decrease falls risk    Time  12    Period  Weeks    Status  Revised      PT LONG TERM GOAL #4   Title  Pt will ambulate x 200' outside over pavement and grass with supervision and LRAD to improve safety ambulating to wood working shop at home    Time  12    Period  Weeks    Status  Revised      PT LONG TERM GOAL #5   Title  Pt will negotiate 8 stairs with 2 rails with alternating sequence with supervision    Time  12    Period  Weeks    Status  Revised            Plan - 06/30/19 2141    Clinical Impression Statement  Pt continues to have extensor tone/spasticity with knees frequently hyperextending requiring pt to stop during gait training in water and initiate flexion to prevent genu recurvatum with knees locking in extension.  Pt able to perform active knee flexion in seated position on bench for approx. 8 reps on each leg prior to fatigue/decr. muscle endurance of hamstrings.    Comorbidities  multiple falls, vitamin D insufficiency, Type 1 IDDM, diabetic retinopathy, hypothyroidism, HTN, hyperlipidemia, R tibia fracture, L ankle fracture with ORIF    PT Frequency  2x / week    PT Duration  12 weeks    PT Treatment/Interventions  ADLs/Self Care Home Management;Aquatic Therapy;Electrical Stimulation;DME Instruction;Gait training;Stair training;Functional mobility training;Therapeutic activities;Therapeutic exercise;Balance training;Neuromuscular re-education;Patient/family education;Orthotic Fit/Training;Passive range of motion;Energy conservation    PT Next Visit Plan  Bioness on R  and LLE - hamstring strength. Sidelying modified plank; hamstring exercises with physioball. Bioness with leg press with 90lb bilat LE, 50lb with single leg.  Endurance on [employer].  Quadruped core strengthening.  Continue to focus on core/postural control training without UE support in standing, hip flexion and knee flexion strengthening, SLS and weight shift training (rockerboard). tall kneeling for proximal strengthening    Consulted and Agree with Plan of Care  Patient       Patient will benefit from skilled therapeutic intervention in order to improve the following deficits and impairments:  Abnormal gait, Decreased balance, Decreased endurance, Decreased coordination, Decreased range of motion, Decreased strength, Difficulty walking, Impaired tone, Postural dysfunction, Decreased activity tolerance  Visit Diagnosis: Other abnormalities of gait and mobility  Muscle weakness (generalized)  Unsteadiness on feet     Problem List Patient Active Problem List   Diagnosis Date Noted  . Vitamin D insufficiency 11/29/2016  . Hypogonadism in male 11/01/2016  . Type 1 diabetes mellitus ([place])   . Hypothyroidism   . Hypertension   . Hyperlipidemia   . Closed fracture of right fibula and  tibia 10/29/2016  . Closed tibia fracture 10/29/2016  . Closed fracture of right tibial plafond with fibula involvement 10/29/2016  . Abnormal liver function tests 03/06/2013  . RASH AND OTHER NONSPECIFIC SKIN ERUPTION 10/07/2009  . SHOULDER PAIN, LEFT 12/27/2008  . Type I (juvenile type) diabetes mellitus without mention of complication, uncontrolled 12/03/2007  . HYPOTHYROIDISM 11/12/2007  . HYPERCHOLESTEROLEMIA 11/12/2007  . MULTIPLE SCLEROSIS 08/29/2007  . PROLIFERATIVE DIABETIC RETINOPATHY 08/29/2007  . HYPERTENSION 12/25/2006    [place], [place], [place] 06/30/2019, 9:53 PM  [facility] [address] [place], [place],  [address] Phone: [phone]   Fax:  [phone]  Name: ENGEL PETROSSIAN MRN: [id number] Date of Birth: [birth year]

## 2019-07-01 ENCOUNTER — Ambulatory Visit: Payer: Medicare Other | Admitting: Physical Therapy

## 2019-07-03 ENCOUNTER — Other Ambulatory Visit: Payer: Self-pay

## 2019-07-03 ENCOUNTER — Telehealth: Payer: Self-pay

## 2019-07-03 ENCOUNTER — Ambulatory Visit: Payer: Medicare Other | Admitting: Physical Therapy

## 2019-07-03 DIAGNOSIS — M21371 Foot drop, right foot: Secondary | ICD-10-CM | POA: Diagnosis not present

## 2019-07-03 DIAGNOSIS — R262 Difficulty in walking, not elsewhere classified: Secondary | ICD-10-CM | POA: Diagnosis not present

## 2019-07-03 DIAGNOSIS — R296 Repeated falls: Secondary | ICD-10-CM | POA: Diagnosis not present

## 2019-07-03 DIAGNOSIS — M21372 Foot drop, left foot: Secondary | ICD-10-CM

## 2019-07-03 DIAGNOSIS — M6281 Muscle weakness (generalized): Secondary | ICD-10-CM

## 2019-07-03 DIAGNOSIS — R2689 Other abnormalities of gait and mobility: Secondary | ICD-10-CM | POA: Diagnosis not present

## 2019-07-03 DIAGNOSIS — R2681 Unsteadiness on feet: Secondary | ICD-10-CM

## 2019-07-03 NOTE — Telephone Encounter (Signed)
Patient called today stating Oletta Lamas has been reaching out to [place] for over a week to get Rx for pump supplies-I called [facility] and did verbal order for pump supplies but was told by [employer] that a CMN is needed for the sensors- I gave her our fax number and called the patient to update him-FYI

## 2019-07-03 NOTE — Telephone Encounter (Signed)
error 

## 2019-07-04 NOTE — Therapy (Signed)
[facility] [address] [place], [place], [address] Phone: [phone]   Fax:  [phone]  Physical Therapy Treatment and 10th visit Progress Note  Patient Details  Name: Johnathan Ross MRN: [id number] Date of Birth: Jul 16, 1970 Referring Provider (PT): Carylon Perches, MD   Encounter Date: 07/03/2019   Progress Note  Reporting Period 05/18/2019 to 07/03/2019  See note below for Objective Data and Assessment of Progress/Goals.    PT End of Session - 07/04/19 1214    Visit Number  29   10th visit note performed at 24; perform again at 83   Number of Visits  53    Date for PT Re-Evaluation  10/02/19    Authorization Type  Medicare Part A and B; 10th visit PN    PT Start Time  0846    PT Stop Time  0935    PT Time Calculation (min)  49 min    Activity Tolerance  Patient limited by fatigue   limited by spasticity   Behavior During Therapy  [facility] for tasks assessed/performed       Past Medical History:  Diagnosis Date  . Hyperlipidemia   . Hypertension   . Hypogonadism in male 11/01/2016  . Hypothyroidism   . Multiple sclerosis ([place])   . 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 Type 1 diabetes mellitus ([place]) dx'd 1994  . Vitamin D insufficiency 11/29/2016    Past Surgical History:  Procedure Laterality Date  . EYE SURGERY Bilateral    "laser for diabetic retinopathy"  . FRACTURE SURGERY    . OPEN REDUCTION INTERNAL FIXATION (ORIF) TIBIA/FIBULA FRACTURE Right 10/30/2016  . ORIF ANKLE FRACTURE Left 2015  . ORIF TIBIA FRACTURE Right 10/30/2016   Procedure: OPEN REDUCTION INTERNAL FIXATION (ORIF) TIBIA FIBULA FRACTURE;  Surgeon: Altamese Draper, MD;  Location: [facility];  Service: Orthopedics;  Laterality: Right;  . RETINAL LASER PROCEDURE Bilateral    "for diabetic retinopathy"    There were no vitals filed for this visit.  Subjective Assessment - 07/04/19 1202    Subjective  No issues to report; will  have to cancel next pool session due to eye appointment - will have to have eyes dilated and difficult to see/walk after that.  Will continue with 2x/week land appointments.  Still has not been contacted to schedule MRI.    Patient is accompained by:  Family member   parents   Pertinent History  multiple falls, vitamin D insufficiency, Type 1 IDDM, diabetic retinopathy, hypothyroidism, HTN, hyperlipidemia, R tibia fracture, L ankle fracture with ORIF    Limitations  Walking    Diagnostic tests  No new lesions seen on MRI    Patient Stated Goals  To improve walking    Currently in Pain?  No/denies         [facility] PT Assessment - 07/03/19 0857      Assessment   Medical Diagnosis  MS    Referring Provider (PT)  Carylon Perches, MD    Onset Date/Surgical Date  12/15/18    Hand Dominance  Right    Prior Therapy  yes, outpatient PT following fall with ankle fracture      Precautions   Precautions  Other (comment)    Precaution Comments  multiple falls, vitamin D insufficiency, Type 1 IDDM, diabetic retinopathy, hypothyroidism, HTN, hyperlipidemia, R tibia fracture, L ankle fracture with ORIF    Required Braces or Orthoses  Other Brace/Splint    Other Brace/Splint  AFO RLE  Restrictions   Weight Bearing Restrictions  No      Prior Function   Level of Independence  Independent      Observation/Other Assessments   Focus on Therapeutic Outcomes (FOTO)   N/A      Transfers   Transfers  Sit to Stand;Stand to Sit;Stand Pivot Transfers    Sit to Stand  5: Supervision;With upper extremity assist    Stand to Sit  5: Supervision;With upper extremity assist    Stand Pivot Transfers  5: Supervision    Stand Pivot Transfer Details (indicate cue type and reason)  with RW -unable to perform without UE support      Ambulation/Gait   Ambulation/Gait  Yes    Ambulation/Gait Assistance  5: Supervision;3: Mod assist    Ambulation/Gait Assistance Details  with RW and natural gait pt continues  to ambulate by lifting self with UE and swinging bilat LE through.  Therapist donned shoe cover to simulate leather toe cap for improved swing through of RLE pt only required min A from therapist to prevent genu recurvatum and extensor tone in stance phase; without shoe cover pt requires mod A  to initiate knee flexion on RLE.  Pt wanted to attempt gait with cane but unable to stabilize and initiate stepping    Ambulation Distance (Feet)  50 Feet    Assistive device  Rolling walker    Gait Pattern  Decreased hip/knee flexion - right;Decreased hip/knee flexion - left;Decreased dorsiflexion - right;Decreased dorsiflexion - left;Right genu recurvatum;Poor foot clearance - left;Poor foot clearance - right   swing through of LE   Ambulation Surface  Level;Indoor    Stairs  Yes    Stairs Assistance  4: Min guard    Stair Management Technique  Two rails;Alternating pattern;Step to pattern;Forwards    Number of Stairs  4    Height of Stairs  6    Gait Comments  Unable to perform gait outside due to safety and pt's ongoing gait impairments      Standardized Balance Assessment   10 Meter Walk  19 seconds over 14 feet: .73 ft/sec with RW, no AFO cues to keep knees soft, significant R foot drag, no standing rest break needed with shorter distance      Berg Balance Test   Sit to Stand  Able to stand  independently using hands    Standing Unsupported  Able to stand 30 seconds unsupported    Sitting with Back Unsupported but Feet Supported on Floor or Stool  Able to sit safely and securely 2 minutes    Stand to Sit  Controls descent by using hands    Transfers  Able to transfer with verbal cueing and /or supervision    Standing Unsupported with Eyes Closed  Able to stand 3 seconds    Standing Unsupported with Feet Together  Needs help to attain position and unable to hold for 15 seconds    From Standing, Reach Forward with Outstretched Arm  Reaches forward but needs supervision    From Standing Position,  Pick up Object from Floor  Able to pick up shoe, needs supervision    From Standing Position, Turn to Look Behind Over each Shoulder  Turn sideways only but maintains balance    Turn 360 Degrees  Needs assistance while turning    Standing Unsupported, Alternately Place Feet on Step/Stool  Needs assistance to keep from falling or unable to try    Standing Unsupported, One Foot in [employer]  to take small step independently and hold 30 seconds    Standing on One Leg  Unable to try or needs assist to prevent fall    Total Score  24    Berg comment:  24/56                           PT Education - 07/04/19 1213    Education Details  regression of function, strength and balance since initial evaluation; will renew for 2 more months of land appointments; may add in aquatic again in the future    Person(s) Educated  Patient    Methods  Explanation    Comprehension  Verbalized understanding       PT Short Term Goals - 06/30/19 2147      PT SHORT TERM GOAL #1   Title  Patient will demonstrate independence with updated HEP    Time  4    Period  Weeks    Status  Deferred    Target Date  06/01/19   target date extended due to pt on hold due to DVT in LUE     PT SHORT TERM GOAL #2   Title  Pt will improve gait velocity with RW to >/= 0.8 ft/sec with decreased use of UE to lift and swing RLE through    Baseline  declined to .44 ft/sec following Botox    Time  4    Period  Weeks    Status  Deferred    Target Date  06/01/19      PT SHORT TERM GOAL #3   Title  Pt will improve BERG to >/= 33/56    Baseline  29/56    Time  4    Period  Weeks    Status  Deferred    Target Date  06/01/19      PT SHORT TERM GOAL #4   Title  Pt will ambulate x 100' over indoor surfaces with RW and Bioness or AFO MOD I with 2 standing rest breaks and improved R hip and knee flexion and foot clearance without lifting through UE    Baseline  5-6 standing rest breaks to ambulate x 40' with RW  and Bioness    Time  4    Period  Weeks    Status  Deferred    Target Date  06/01/19      PT SHORT TERM GOAL #5   Title  Pt will negotate 8 stairs with two rails step to sequence with supervision    Time  4    Period  Weeks    Status  Deferred    Target Date  06/01/19        PT Long Term Goals - 07/03/19 0853      PT LONG TERM GOAL #1   Title  Pt will demonstrate independence with land and aquatic HEP and will report going to [facility] 1-2x/week for ongoing fitness  (ALL LTG set for 90 days; after 12/4 please revise STG to 60 days out - 07/02/2019)    Time  12    Period  Weeks    Status  Achieved      PT LONG TERM GOAL #2   Title  Pt will improve gait velocity with RW to >/= 1.8 ft/sec    Baseline  .73 ft/sec    Time  12    Period  Weeks    Status  Not Met  PT LONG TERM GOAL #3   Title  Pt will improve BERG to >/= 37/56 to decrease falls risk    Baseline  declined to 24/56    Time  12    Period  Weeks    Status  Not Met      PT LONG TERM GOAL #4   Title  Pt will ambulate x 200' outside over pavement and grass with supervision and LRAD to improve safety ambulating to wood working shop at home    Baseline  not safe to perform at this time    Time  12    Period  Weeks    Status  Unable to assess      PT LONG TERM GOAL #5   Title  Pt will negotiate 8 stairs with 2 rails with alternating sequence with supervision    Time  12    Period  Weeks    Status  Not Met       New goals for recertification:     PT Short Term Goals - 07/04/19 1223      PT SHORT TERM GOAL #1   Title  Patient will demonstrate independence with updated HEP    Time  4    Period  Weeks    Status  Revised    Target Date  08/03/19   target date extended due to pt on hold due to DVT in LUE     PT SHORT TERM GOAL #2   Title  Pt will improve gait velocity with RW to >/= 0.8 ft/sec with decreased use of UE to lift and swing RLE through    Baseline  .71 ft/sec with RW    Time  4    Period   Weeks    Status  Revised    Target Date  08/03/19      PT SHORT TERM GOAL #3   Title  Pt will improve BERG to >/= 30/56    Baseline  24/56    Time  4    Period  Weeks    Status  Revised    Target Date  08/03/19      PT SHORT TERM GOAL #4   Title  Pt will ambulate x 100' over indoor surfaces with RW and Bioness or AFO MOD I with 2 standing rest breaks and improved R hip and knee flexion and foot clearance without lifting through UE    Time  4    Period  Weeks    Status  Revised    Target Date  08/03/19      PT SHORT TERM GOAL #5   Title  Pt will negotate 8 stairs with two rails step to sequence with supervision    Time  4    Period  Weeks    Status  Revised    Target Date  08/03/19      PT Long Term Goals - 07/04/19 1225      PT LONG TERM GOAL #1   Title  Pt will demonstrate independence with land HEP    Time  12    Period  Weeks    Status  Revised    Target Date  10/02/19      PT LONG TERM GOAL #2   Title  Pt will improve gait velocity with RW to >/= 1.8 ft/sec    Time  12    Period  Weeks    Status  Revised    Target Date  10/02/19      PT LONG TERM GOAL #3   Title  Pt will improve BERG to >/= 38/56 to decrease falls risk    Time  12    Period  Weeks    Status  Revised    Target Date  10/02/19      PT LONG TERM GOAL #4   Title  Pt will ambulate x 200' outside over pavement and grass with supervision and LRAD to improve safety ambulating to wood working shop at home    Time  12    Period  Weeks    Status  Revised    Target Date  10/02/19      PT LONG TERM GOAL #5   Title  Pt will negotiate 8 stairs with 2 rails with alternating sequence with appropriate AFO/Bioness with supervision    Time  12    Period  Weeks    Status  Revised    Target Date  10/02/19         Plan - 07/04/19 1217    Clinical Impression Statement  Treatment session focused on assessment of LTG to determine current functional level.  Pt is independent with HEP but did not meet any  LTG, one goal was unsafe to perform today.  Pt has experienced a regression in functional level, LE strength and balance and continues to be at a very high risk for falls.  Will hold on aquatic therapy for now and will continue with land therapy 2x/week to continue to address while pt continues to follow up with neurology.  Pt will be getting an MRI soon to re-assess status of MS and to see if any new lesions exist.  Pt agreeable with plan.  Will benefit from continued skilled PT services to address these impairments, to maximize functional mobility independence and decrease falls risk.    Comorbidities  multiple falls, vitamin D insufficiency, Type 1 IDDM, diabetic retinopathy, hypothyroidism, HTN, hyperlipidemia, R tibia fracture, L ankle fracture with ORIF    Rehab Potential  Fair    PT Frequency  2x / week    PT Duration  12 weeks    PT Treatment/Interventions  ADLs/Self Care Home Management;Aquatic Therapy;Electrical Stimulation;DME Instruction;Gait training;Stair training;Functional mobility training;Therapeutic activities;Therapeutic exercise;Balance training;Neuromuscular re-education;Patient/family education;Orthotic Fit/Training;Passive range of motion;Energy conservation    PT Next Visit Plan  Bioness on R and LLE - hamstring strength. Sidelying modified plank; hamstring exercises with physioball. Bioness with leg press with 90lb bilat LE, 50lb with single leg.  Endurance on [employer].  Quadruped core strengthening.  Continue to focus on core/postural control training without UE support in standing, hip flexion and knee flexion strengthening, SLS and weight shift training (rockerboard). tall kneeling for proximal strengthening    Consulted and Agree with Plan of Care  Patient       Patient will benefit from skilled therapeutic intervention in order to improve the following deficits and impairments:  Abnormal gait, Decreased balance, Decreased endurance, Decreased coordination, Decreased range of  motion, Decreased strength, Difficulty walking, Impaired tone, Postural dysfunction, Decreased activity tolerance, Decreased mobility  Visit Diagnosis: Other abnormalities of gait and mobility  Muscle weakness (generalized)  Unsteadiness on feet  Repeated falls  Difficulty in walking, not elsewhere classified  Foot drop, right  Foot drop, left     Problem List Patient Active Problem List   Diagnosis Date Noted  . Vitamin D insufficiency 11/29/2016  . Hypogonadism in male 11/01/2016  . Type 1 diabetes mellitus ([place])   .  Hypothyroidism   . Hypertension   . Hyperlipidemia   . Closed fracture of right fibula and tibia 10/29/2016  . Closed tibia fracture 10/29/2016  . Closed fracture of right tibial plafond with fibula involvement 10/29/2016  . Abnormal liver function tests 03/06/2013  . RASH AND OTHER NONSPECIFIC SKIN ERUPTION 10/07/2009  . SHOULDER PAIN, LEFT 12/27/2008  . Type I (juvenile type) diabetes mellitus without mention of complication, uncontrolled 12/03/2007  . HYPOTHYROIDISM 11/12/2007  . HYPERCHOLESTEROLEMIA 11/12/2007  . MULTIPLE SCLEROSIS 08/29/2007  . PROLIFERATIVE DIABETIC RETINOPATHY 08/29/2007  . HYPERTENSION 12/25/2006   [name], PT, DPT 07/04/19    12:27 PM    [facility] [address] [place], [place], [address] Phone: [phone]   Fax:  [phone]  Name: KANIEL KIANG MRN: [id number] Date of Birth: 1970/09/27

## 2019-07-06 ENCOUNTER — Ambulatory Visit: Payer: Medicare Other | Admitting: Physical Therapy

## 2019-07-06 DIAGNOSIS — H18592 Other hereditary corneal dystrophies, left eye: Secondary | ICD-10-CM | POA: Diagnosis not present

## 2019-07-06 DIAGNOSIS — H31009 Unspecified chorioretinal scars, unspecified eye: Secondary | ICD-10-CM | POA: Diagnosis not present

## 2019-07-06 DIAGNOSIS — H35371 Puckering of macula, right eye: Secondary | ICD-10-CM | POA: Diagnosis not present

## 2019-07-06 DIAGNOSIS — E103553 Type 1 diabetes mellitus with stable proliferative diabetic retinopathy, bilateral: Secondary | ICD-10-CM | POA: Diagnosis not present

## 2019-07-06 LAB — HM DIABETES EYE EXAM

## 2019-07-06 NOTE — Telephone Encounter (Signed)
Paperwork was received, but it was received in the middle of last week. All paperwork has a turnaround time of 7-10 business days, and MD took all paperwork on Friday. It will be faxed back once it is completed.

## 2019-07-08 ENCOUNTER — Encounter: Payer: Self-pay | Admitting: Physical Therapy

## 2019-07-08 ENCOUNTER — Other Ambulatory Visit: Payer: Self-pay

## 2019-07-08 ENCOUNTER — Ambulatory Visit: Payer: Medicare Other | Admitting: Physical Therapy

## 2019-07-08 DIAGNOSIS — R296 Repeated falls: Secondary | ICD-10-CM | POA: Diagnosis not present

## 2019-07-08 DIAGNOSIS — R2681 Unsteadiness on feet: Secondary | ICD-10-CM

## 2019-07-08 DIAGNOSIS — R2689 Other abnormalities of gait and mobility: Secondary | ICD-10-CM

## 2019-07-08 DIAGNOSIS — M21371 Foot drop, right foot: Secondary | ICD-10-CM

## 2019-07-08 DIAGNOSIS — M6281 Muscle weakness (generalized): Secondary | ICD-10-CM | POA: Diagnosis not present

## 2019-07-08 DIAGNOSIS — R262 Difficulty in walking, not elsewhere classified: Secondary | ICD-10-CM

## 2019-07-08 DIAGNOSIS — M21372 Foot drop, left foot: Secondary | ICD-10-CM

## 2019-07-08 NOTE — Therapy (Signed)
[facility] [address] [place], [place], [address] Phone: [phone]   Fax:  [phone]  Physical Therapy Treatment  Patient Details  Name: Johnathan Ross MRN: [id number] Date of Birth: 1971-01-23 Referring Provider (PT): Carylon Perches, MD   Encounter Date: 07/08/2019  PT End of Session - 07/08/19 2111    Visit Number  30   perform next 10th visit note in visit 39   Number of Visits  53    Date for PT Re-Evaluation  10/02/19    Authorization Type  Medicare Part A and B; 10th visit PN    PT Start Time  0845    PT Stop Time  0930    PT Time Calculation (min)  45 min    Activity Tolerance  Patient limited by fatigue   limited by spasticity   Behavior During Therapy  [facility] for tasks assessed/performed       Past Medical History:  Diagnosis Date  . Hyperlipidemia   . Hypertension   . Hypogonadism in male 11/01/2016  . Hypothyroidism   . Multiple sclerosis ([place])   . 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 Type 1 diabetes mellitus ([place]) dx'd 1994  . Vitamin D insufficiency 11/29/2016    Past Surgical History:  Procedure Laterality Date  . EYE SURGERY Bilateral    "laser for diabetic retinopathy"  . FRACTURE SURGERY    . OPEN REDUCTION INTERNAL FIXATION (ORIF) TIBIA/FIBULA FRACTURE Right 10/30/2016  . ORIF ANKLE FRACTURE Left 2015  . ORIF TIBIA FRACTURE Right 10/30/2016   Procedure: OPEN REDUCTION INTERNAL FIXATION (ORIF) TIBIA FIBULA FRACTURE;  Surgeon: Altamese [place], MD;  Location: [facility];  Service: Orthopedics;  Laterality: Right;  . RETINAL LASER PROCEDURE Bilateral    "for diabetic retinopathy"    There were no vitals filed for this visit.  Subjective Assessment - 07/08/19 0853    Subjective  Eye appointment went well, still has 20/20 vision.  Started back on Reflexology this past week, feels like legs are less "jumpy".    Patient is accompained by:  Family member   parents   Pertinent  History  multiple falls, vitamin D insufficiency, Type 1 IDDM, diabetic retinopathy, hypothyroidism, HTN, hyperlipidemia, R tibia fracture, L ankle fracture with ORIF    Limitations  Walking    Diagnostic tests  No new lesions seen on MRI    Patient Stated Goals  To improve walking                       [facility] Adult PT Treatment/Exercise - 07/08/19 2053      Ambulation/Gait   Ambulation/Gait  Yes    Ambulation/Gait Assistance  4: Min assist    Ambulation/Gait Assistance Details  With RW - started with Bioness on in gait mode but turned off to avoid activation of spasticity.  Performed carryover of NMR and graded motor imagery.  Therapist cued pt to avoid pressing down through UE on RW and to use RW for balance only and to initiate swing with gentle activation of hip and knee flexion.  When pt began to experience increased spasticity in gastroc cued pt to stand and allow muscles to relax before continuing.  With this gait technique pt was able to ambulate from gym > waiting area without Bioness/AFO and without use of UE lift and swinging of bilat LE.  Discussed with pt how this compensatory gait technique (lifting and swinging) may be contributing to extensor tone and therapy  would focus on gait training without use of compensatory extension    Ambulation Distance (Feet)  100 Feet    Assistive device  Rolling walker    Gait Pattern  Decreased dorsiflexion - right;Decreased dorsiflexion - left;Poor foot clearance - left;Poor foot clearance - right    Ambulation Surface  Level;Indoor      Neuro Re-ed    Neuro Re-ed Details   Following Bioness exercises in supine focused on use of graded motor imagery to focus on isolated hip flexor activation with knee flexion without overflow to hip extensors and trunk/neck extensors.  After pt performed imagery, pt cued to perform imagery while PT guided each LE through PROM into hip and knee flexion.  Completed imagery with pt gently activating hip  flexors and distal hamstring for single knee to chest without overflow to extensor muscles.      Exercises   Exercises  Other Exercises    Other Exercises   With Bioness in training mode performed bilat hamstring training with: 2 sets x 5 reps bridge with R and L walk out, walk in.  Also performed 2 sets x 5 reps single leg roll out into extension for hamstring eccentric control and hamstring curl for concentric strengthening with heel on foam roller - pt reporting significant difficulty with initiating hip and knee flexion and reports feeling like he is fighting against hip extension and is using all of his muscles to perform.      Modalities   Modalities  Teacher, English as a foreign language Location  bilat anterior tib and hamstring muscle groups    Electrical Stimulation Action  open and closed chain ankle DF, hip extension, knee flexion    Electrical Stimulation Parameters  Tablet 2, steering electrode R, quick fit L    Electrical Stimulation Goals  Strength;Tone;Neuromuscular facilitation             PT Education - 07/08/19 2110    Education Details  see NMR and gait technique with gentle activation for grading of muscle activation and prevention of extensor tone    Person(s) Educated  Patient    Methods  Explanation;Demonstration    Comprehension  Need further instruction       PT Short Term Goals - 07/04/19 1223      PT SHORT TERM GOAL #1   Title  Patient will demonstrate independence with updated HEP    Time  4    Period  Weeks    Status  Revised    Target Date  08/03/19   target date extended due to pt on hold due to DVT in LUE     PT SHORT TERM GOAL #2   Title  Pt will improve gait velocity with RW to >/= 0.8 ft/sec with decreased use of UE to lift and swing RLE through    Baseline  .73 ft/sec with RW    Time  4    Period  Weeks    Status  Revised    Target Date  08/03/19      PT SHORT TERM GOAL #3   Title  Pt will  improve BERG to >/= 30/56    Baseline  24/56    Time  4    Period  Weeks    Status  Revised    Target Date  08/03/19      PT SHORT TERM GOAL #4   Title  Pt will ambulate x 100' over indoor surfaces  with RW and Bioness or AFO MOD I with 2 standing rest breaks and improved R hip and knee flexion and foot clearance without lifting through UE    Time  4    Period  Weeks    Status  Revised    Target Date  08/03/19      PT SHORT TERM GOAL #5   Title  Pt will negotate 8 stairs with two rails step to sequence with supervision    Time  4    Period  Weeks    Status  Revised    Target Date  08/03/19        PT Long Term Goals - 07/04/19 1225      PT LONG TERM GOAL #1   Title  Pt will demonstrate independence with land HEP    Time  12    Period  Weeks    Status  Revised    Target Date  10/02/19      PT LONG TERM GOAL #2   Title  Pt will improve gait velocity with RW to >/= 1.8 ft/sec    Time  12    Period  Weeks    Status  Revised    Target Date  10/02/19      PT LONG TERM GOAL #3   Title  Pt will improve BERG to >/= 38/56 to decrease falls risk    Time  12    Period  Weeks    Status  Revised    Target Date  10/02/19      PT LONG TERM GOAL #4   Title  Pt will ambulate x 200' outside over pavement and grass with supervision and LRAD to improve safety ambulating to wood working shop at home    Time  12    Period  Weeks    Status  Revised    Target Date  10/02/19      PT LONG TERM GOAL #5   Title  Pt will negotiate 8 stairs with 2 rails with alternating sequence with appropriate AFO/Bioness with supervision    Time  12    Period  Weeks    Status  Revised    Target Date  10/02/19            Plan - 07/08/19 2112    Clinical Impression Statement  Treatment session initiated with use of Bioness on bilat LE for continued hamstring and ankle DF strengthening but pt continued to demonstrate overflow of activation to neck extensors, trunk and hip extensors.  Rest of  session focused on NMR through graded motor imagery for grading of movement and isolated muscle activation in supine and then when ambulating.  A significant decline in extensor tone noted during gait today.    Comorbidities  multiple falls, vitamin D insufficiency, Type 1 IDDM, diabetic retinopathy, hypothyroidism, HTN, hyperlipidemia, R tibia fracture, L ankle fracture with ORIF    Rehab Potential  Fair    PT Frequency  2x / week    PT Duration  12 weeks    PT Treatment/Interventions  ADLs/Self Care Home Management;Aquatic Therapy;Electrical Stimulation;DME Instruction;Gait training;Stair training;Functional mobility training;Therapeutic activities;Therapeutic exercise;Balance training;Neuromuscular re-education;Patient/family education;Orthotic Fit/Training;Passive range of motion;Energy conservation    PT Next Visit Plan  Bioness on hold for now; Focus on gentle activation of hip and knee flexion in supine/standing/gait to avoid overflow to extensors; Sidelying modified plank; hamstring exercises with physioball. Quadruped core strengthening.  Continue to focus on core/postural control training without UE support  in standing, hip flexion and knee flexion strengthening, SLS and weight shift training (rockerboard). tall kneeling for proximal strengthening    Consulted and Agree with Plan of Care  Patient       Patient will benefit from skilled therapeutic intervention in order to improve the following deficits and impairments:  Abnormal gait, Decreased balance, Decreased endurance, Decreased coordination, Decreased range of motion, Decreased strength, Difficulty walking, Impaired tone, Postural dysfunction, Decreased activity tolerance, Decreased mobility  Visit Diagnosis: Other abnormalities of gait and mobility  Muscle weakness (generalized)  Unsteadiness on feet  Repeated falls  Difficulty in walking, not elsewhere classified  Foot drop, right  Foot drop, left     Problem  List Patient Active Problem List   Diagnosis Date Noted  . Vitamin D insufficiency 11/29/2016  . Hypogonadism in male 11/01/2016  . Type 1 diabetes mellitus ([place])   . Hypothyroidism   . Hypertension   . Hyperlipidemia   . Closed fracture of right fibula and tibia 10/29/2016  . Closed tibia fracture 10/29/2016  . Closed fracture of right tibial plafond with fibula involvement 10/29/2016  . Abnormal liver function tests 03/06/2013  . RASH AND OTHER NONSPECIFIC SKIN ERUPTION 10/07/2009  . SHOULDER PAIN, LEFT 12/27/2008  . Type I (juvenile type) diabetes mellitus without mention of complication, uncontrolled 12/03/2007  . HYPOTHYROIDISM 11/12/2007  . HYPERCHOLESTEROLEMIA 11/12/2007  . MULTIPLE SCLEROSIS 08/29/2007  . PROLIFERATIVE DIABETIC RETINOPATHY 08/29/2007  . HYPERTENSION 12/25/2006    [name], PT, DPT 07/08/19    9:26 PM    [facility] [address] [place], [place], [address] Phone: [phone]   Fax:  [phone]  Name: Johnathan Ross MRN: [id number] Date of Birth: Dec 21, 1970

## 2019-07-10 ENCOUNTER — Encounter: Payer: Self-pay | Admitting: Physical Therapy

## 2019-07-10 ENCOUNTER — Ambulatory Visit: Payer: Medicare Other | Admitting: Physical Therapy

## 2019-07-10 ENCOUNTER — Other Ambulatory Visit: Payer: Self-pay

## 2019-07-10 DIAGNOSIS — R296 Repeated falls: Secondary | ICD-10-CM

## 2019-07-10 DIAGNOSIS — M6281 Muscle weakness (generalized): Secondary | ICD-10-CM

## 2019-07-10 DIAGNOSIS — R262 Difficulty in walking, not elsewhere classified: Secondary | ICD-10-CM | POA: Diagnosis not present

## 2019-07-10 DIAGNOSIS — M21371 Foot drop, right foot: Secondary | ICD-10-CM | POA: Diagnosis not present

## 2019-07-10 DIAGNOSIS — R2681 Unsteadiness on feet: Secondary | ICD-10-CM

## 2019-07-10 DIAGNOSIS — M21372 Foot drop, left foot: Secondary | ICD-10-CM

## 2019-07-10 DIAGNOSIS — R2689 Other abnormalities of gait and mobility: Secondary | ICD-10-CM | POA: Diagnosis not present

## 2019-07-10 NOTE — Therapy (Signed)
[facility] [address] [place], [place], [address] Phone: [phone]   Fax:  [phone]  Physical Therapy Treatment  Patient Details  Name: Johnathan Ross MRN: [id number] Date of Birth: 10-May-1971 Referring Provider (PT): Carylon Perches, MD   Encounter Date: 07/10/2019  PT End of Session - 07/10/19 1302    Visit Number  31    Number of Visits  55    Date for PT Re-Evaluation  10/02/19    Authorization Type  Medicare Part A and B; 10th visit PN    PT Start Time  0848    PT Stop Time  0930    PT Time Calculation (min)  42 min    Activity Tolerance  Patient limited by fatigue   limited by spasticity   Behavior During Therapy  [facility] for tasks assessed/performed       Past Medical History:  Diagnosis Date  . Hyperlipidemia   . Hypertension   . Hypogonadism in male 11/01/2016  . Hypothyroidism   . Multiple sclerosis ([place])   . 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 Type 1 diabetes mellitus ([place]) dx'd 1994  . Vitamin D insufficiency 11/29/2016    Past Surgical History:  Procedure Laterality Date  . EYE SURGERY Bilateral    "laser for diabetic retinopathy"  . FRACTURE SURGERY    . OPEN REDUCTION INTERNAL FIXATION (ORIF) TIBIA/FIBULA FRACTURE Right 10/30/2016  . ORIF ANKLE FRACTURE Left 2015  . ORIF TIBIA FRACTURE Right 10/30/2016   Procedure: OPEN REDUCTION INTERNAL FIXATION (ORIF) TIBIA FIBULA FRACTURE;  Surgeon: Altamese [place], MD;  Location: [facility];  Service: Orthopedics;  Laterality: Right;  . RETINAL LASER PROCEDURE Bilateral    "for diabetic retinopathy"    There were no vitals filed for this visit.  Subjective Assessment - 07/10/19 1246    Subjective  Discussed functional e-stim with reflexology provider who feels like it may be stimulating nerves too much.    Patient is accompained by:  Family member   parents   Pertinent History  multiple falls, vitamin D insufficiency, Type 1 IDDM,  diabetic retinopathy, hypothyroidism, HTN, hyperlipidemia, R tibia fracture, L ankle fracture with ORIF    Limitations  Walking    Diagnostic tests  No new lesions seen on MRI    Patient Stated Goals  To improve walking    Currently in Pain?  No/denies                       [facility] Adult PT Treatment/Exercise - 07/10/19 0902      Ambulation/Gait   Ambulation/Gait  Yes    Ambulation/Gait Assistance  4: Min assist    Ambulation/Gait Assistance Details  with RW after performing graded LE exercises on mat; shoe covres on toes of bilat shoes.  Provided cues for decreased pushing through UE and lifting.  Cued to use UE for balance but focused again on gentle, isolated activation of hip and knee flexion during swing phase with shoe covers preventing toe drag; also cued to decrease step length slightly to prevent hyperextension of knees at initial stance.  Cued pt to continue this sequence even when turning/pivoting to sit in chair.  When pt noted increased extensor tone, pt paused and allowed spasticity to relax before continuing.  Pt did not have to lift through UE to swing LE through once during gait training    Ambulation Distance (Feet)  150 Feet    Assistive device  Rolling walker  Gait Pattern  Decreased dorsiflexion - right;Decreased dorsiflexion - left;Poor foot clearance - left;Poor foot clearance - right    Ambulation Surface  Level;Indoor      Neuro Re-ed    Neuro Re-ed Details   In supine continued to focus on isolated hip and knee flexion without overflow into head/neck, trunk and hip extensors - cued to keep chin tucked and activate in core prior to performing LE movement.  Performed 2-3 sets x 10 reps: alternating heel slides with shoe covers on feet, hip flexion marching.  Pt able to maintain same level of contraction/movement through all 10 repetitions.  Transitioned to quadruped and performed same activation sequence while performing 2 sets x 10 reps hip flexion bringing  knee to elbow.  In between exercises performed stretching into child's pose or when in supine bringing L knee to chest to stretch proximal hamstring.                 PT Short Term Goals - 07/04/19 1223      PT SHORT TERM GOAL #1   Title  Patient will demonstrate independence with updated HEP    Time  4    Period  Weeks    Status  Revised    Target Date  08/03/19   target date extended due to pt on hold due to DVT in LUE     PT SHORT TERM GOAL #2   Title  Pt will improve gait velocity with RW to >/= 0.8 ft/sec with decreased use of UE to lift and swing RLE through    Baseline  .80 ft/sec with RW    Time  4    Period  Weeks    Status  Revised    Target Date  08/03/19      PT SHORT TERM GOAL #3   Title  Pt will improve BERG to >/= 30/56    Baseline  24/56    Time  4    Period  Weeks    Status  Revised    Target Date  08/03/19      PT SHORT TERM GOAL #4   Title  Pt will ambulate x 100' over indoor surfaces with RW and Bioness or AFO MOD I with 2 standing rest breaks and improved R hip and knee flexion and foot clearance without lifting through UE    Time  4    Period  Weeks    Status  Revised    Target Date  08/03/19      PT SHORT TERM GOAL #5   Title  Pt will negotate 8 stairs with two rails step to sequence with supervision    Time  4    Period  Weeks    Status  Revised    Target Date  08/03/19        PT Long Term Goals - 07/04/19 1225      PT LONG TERM GOAL #1   Title  Pt will demonstrate independence with land HEP    Time  12    Period  Weeks    Status  Revised    Target Date  10/02/19      PT LONG TERM GOAL #2   Title  Pt will improve gait velocity with RW to >/= 1.8 ft/sec    Time  12    Period  Weeks    Status  Revised    Target Date  10/02/19      PT LONG TERM GOAL #  3   Title  Pt will improve BERG to >/= 38/56 to decrease falls risk    Time  12    Period  Weeks    Status  Revised    Target Date  10/02/19      PT LONG TERM GOAL #4    Title  Pt will ambulate x 200' outside over pavement and grass with supervision and LRAD to improve safety ambulating to wood working shop at home    Time  12    Period  Weeks    Status  Revised    Target Date  10/02/19      PT LONG TERM GOAL #5   Title  Pt will negotiate 8 stairs with 2 rails with alternating sequence with appropriate AFO/Bioness with supervision    Time  12    Period  Weeks    Status  Revised    Target Date  10/02/19            Plan - 07/10/19 1302    Clinical Impression Statement  Will continue to hold on Bioness training for now; Continued NMR training in supine, quadruped and with gait training to focus on grading of movement and activation with cues for pt to activate core muscles for proximal stability instead of neck, trunk and hip extension.  Pt able to ambulate further today with no episodes of extensor tone and did not have to use UE to swing LE through.  Will continue to address and progress towards LTG.    Comorbidities  multiple falls, vitamin D insufficiency, Type 1 IDDM, diabetic retinopathy, hypothyroidism, HTN, hyperlipidemia, R tibia fracture, L ankle fracture with ORIF    Rehab Potential  Fair    PT Frequency  2x / week    PT Duration  12 weeks    PT Treatment/Interventions  ADLs/Self Care Home Management;Aquatic Therapy;Electrical Stimulation;DME Instruction;Gait training;Stair training;Functional mobility training;Therapeutic activities;Therapeutic exercise;Balance training;Neuromuscular re-education;Patient/family education;Orthotic Fit/Training;Passive range of motion;Energy conservation    PT Next Visit Plan  Bioness on hold for now; Focus on gentle activation of hip and knee flexion in supine/standing/gait to avoid overflow to extensors; Sidelying modified plank; hamstring exercises with physioball. Quadruped core strengthening.  Continue to focus on core/postural control training without UE support in standing, hip flexion and knee flexion  strengthening, SLS and weight shift training (rockerboard). tall kneeling for proximal strengthening    Consulted and Agree with Plan of Care  Patient       Patient will benefit from skilled therapeutic intervention in order to improve the following deficits and impairments:  Abnormal gait, Decreased balance, Decreased endurance, Decreased coordination, Decreased range of motion, Decreased strength, Difficulty walking, Impaired tone, Postural dysfunction, Decreased activity tolerance, Decreased mobility  Visit Diagnosis: Other abnormalities of gait and mobility  Muscle weakness (generalized)  Unsteadiness on feet  Repeated falls  Difficulty in walking, not elsewhere classified  Foot drop, right  Foot drop, left     Problem List Patient Active Problem List   Diagnosis Date Noted  . Vitamin D insufficiency 11/29/2016  . Hypogonadism in male 11/01/2016  . Type 1 diabetes mellitus ([place])   . Hypothyroidism   . Hypertension   . Hyperlipidemia   . Closed fracture of right fibula and tibia 10/29/2016  . Closed tibia fracture 10/29/2016  . Closed fracture of right tibial plafond with fibula involvement 10/29/2016  . Abnormal liver function tests 03/06/2013  . RASH AND OTHER NONSPECIFIC SKIN ERUPTION 10/07/2009  . SHOULDER PAIN, LEFT 12/27/2008  .  Type I (juvenile type) diabetes mellitus without mention of complication, uncontrolled 12/03/2007  . HYPOTHYROIDISM 11/12/2007  . HYPERCHOLESTEROLEMIA 11/12/2007  . MULTIPLE SCLEROSIS 08/29/2007  . PROLIFERATIVE DIABETIC RETINOPATHY 08/29/2007  . HYPERTENSION 12/25/2006    [name], PT, DPT 07/10/19    1:08 PM    [facility] [address] [place], [place], [address] Phone: [phone]   Fax:  [phone]  Name: Johnathan Ross MRN: [id number] Date of Birth: 06/16/1970

## 2019-07-15 ENCOUNTER — Encounter: Payer: Self-pay | Admitting: Physical Therapy

## 2019-07-15 ENCOUNTER — Other Ambulatory Visit: Payer: Self-pay

## 2019-07-15 ENCOUNTER — Ambulatory Visit: Payer: Medicare Other | Admitting: Physical Therapy

## 2019-07-15 DIAGNOSIS — R2681 Unsteadiness on feet: Secondary | ICD-10-CM

## 2019-07-15 DIAGNOSIS — R296 Repeated falls: Secondary | ICD-10-CM

## 2019-07-15 DIAGNOSIS — R262 Difficulty in walking, not elsewhere classified: Secondary | ICD-10-CM

## 2019-07-15 DIAGNOSIS — M6281 Muscle weakness (generalized): Secondary | ICD-10-CM | POA: Diagnosis not present

## 2019-07-15 DIAGNOSIS — M21371 Foot drop, right foot: Secondary | ICD-10-CM | POA: Diagnosis not present

## 2019-07-15 DIAGNOSIS — R2689 Other abnormalities of gait and mobility: Secondary | ICD-10-CM | POA: Diagnosis not present

## 2019-07-15 DIAGNOSIS — M21372 Foot drop, left foot: Secondary | ICD-10-CM

## 2019-07-15 NOTE — Therapy (Signed)
[facility] [address] [place], [place], [address] Phone: [phone]   Fax:  [phone]  Physical Therapy Treatment  Patient Details  Name: Johnathan Ross MRN: [id number] Date of Birth: 03-24-1971 Referring Provider (PT): Carylon Perches, MD   Encounter Date: 07/15/2019  PT End of Session - 07/15/19 1211    Visit Number  32    Number of Visits  51    Date for PT Re-Evaluation  10/02/19    Authorization Type  Medicare Part A and B; 10th visit PN    PT Start Time  0852    PT Stop Time  0931    PT Time Calculation (min)  39 min    Activity Tolerance  Patient tolerated treatment well   limited by spasticity   Behavior During Therapy  [facility] for tasks assessed/performed       Past Medical History:  Diagnosis Date  . Hyperlipidemia   . Hypertension   . Hypogonadism in male 11/01/2016  . Hypothyroidism   . Multiple sclerosis ([place])   . 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 Type 1 diabetes mellitus ([place]) dx'd 1994  . Vitamin D insufficiency 11/29/2016    Past Surgical History:  Procedure Laterality Date  . EYE SURGERY Bilateral    "laser for diabetic retinopathy"  . FRACTURE SURGERY    . OPEN REDUCTION INTERNAL FIXATION (ORIF) TIBIA/FIBULA FRACTURE Right 10/30/2016  . ORIF ANKLE FRACTURE Left 2015  . ORIF TIBIA FRACTURE Right 10/30/2016   Procedure: OPEN REDUCTION INTERNAL FIXATION (ORIF) TIBIA FIBULA FRACTURE;  Surgeon: Altamese Olivette, MD;  Location: [facility];  Service: Orthopedics;  Laterality: Right;  . RETINAL LASER PROCEDURE Bilateral    "for diabetic retinopathy"    There were no vitals filed for this visit.  Subjective Assessment - 07/15/19 0856    Subjective  MRI scheduled for Monday.  Lost power this weekend and had to go to a hotel.    Patient is accompained by:  Family member   parents   Pertinent History  multiple falls, vitamin D insufficiency, Type 1 IDDM, diabetic retinopathy,  hypothyroidism, HTN, hyperlipidemia, R tibia fracture, L ankle fracture with ORIF    Limitations  Walking    Diagnostic tests  No new lesions seen on MRI    Patient Stated Goals  To improve walking    Currently in Pain?  No/denies                       [facility] Adult PT Treatment/Exercise - 07/15/19 0857      Ambulation/Gait   Ambulation/Gait  Yes    Ambulation/Gait Assistance  4: Min assist;3: Mod assist    Ambulation/Gait Assistance Details  Continued gait training with shoe covers to minimize toe drag - cues to not push down through UE to lift/swing LE through.  Pt demonstrating improved hip and knee flexion today, intermittent assistance from therapist to prevent RLE adduction.  Pt cued to stop and rest when pt feels the need to push through UE and swing LE to clear foot during swing phase.  Pt wearing AFO today.  No evidence of genu recurvatum today but required therapist manual facilitation at pelvis and trunk for increased proximal/trunk stability.  Pt does not use RW in the home - will use next session to practice how pt can reinforce this gait technique in the home to practice and carry over what pt is performing in therapy    Ambulation Distance (Feet)  200 Feet    Assistive device  Rolling walker    Gait Pattern  Decreased dorsiflexion - right;Decreased dorsiflexion - left;Poor foot clearance - left;Poor foot clearance - right    Ambulation Surface  Level;Indoor      Neuro Re-ed    Neuro Re-ed Details   Supine continued training for core activation and flexor activation: 1 set x 10 reps alternating hip flexion marching with isolated activation, placed one foot on balance disc for increased activation for stabilization while contralateral LE performed hip flexion/march x 10 reps each side - cues to rest when pt noted to be performing with UE, trunk and neck extension.  Transitioned to double knee lift holding yoga block between knees and having pt retrieve block from LE,  return to supine and then peform small crunch to place block back between LE x 10 reps.  Transitioned to sitting first with one foot on balance disc and then removed foot from balance disc due to difficulty - continued to focus on core activation, posterior pelvic tilt with single hip flexion/march x 10 reps each side with tactile, verbal and visual cues to minimize compensatory posterior trunk lean to initiate hip flexion      Exercises   Exercises  Other Exercises    Other Exercises   Supine single knee to chest stretch x 60 seconds each side; also performed between supine NMR exercises to decrease extension tone             PT Education - 07/15/19 1209    Education Details  Will determine a way for pt to practice gait technique in his home (does not use RW in home)    Person(s) Educated  Patient    Methods  Explanation    Comprehension  Verbalized understanding       PT Short Term Goals - 07/04/19 1223      PT SHORT TERM GOAL #1   Title  Patient will demonstrate independence with updated HEP    Time  4    Period  Weeks    Status  Revised    Target Date  08/03/19   target date extended due to pt on hold due to DVT in LUE     PT SHORT TERM GOAL #2   Title  Pt will improve gait velocity with RW to >/= 0.8 ft/sec with decreased use of UE to lift and swing RLE through    Baseline  .56 ft/sec with RW    Time  4    Period  Weeks    Status  Revised    Target Date  08/03/19      PT SHORT TERM GOAL #3   Title  Pt will improve BERG to >/= 30/56    Baseline  24/56    Time  4    Period  Weeks    Status  Revised    Target Date  08/03/19      PT SHORT TERM GOAL #4   Title  Pt will ambulate x 100' over indoor surfaces with RW and Bioness or AFO MOD I with 2 standing rest breaks and improved R hip and knee flexion and foot clearance without lifting through UE    Time  4    Period  Weeks    Status  Revised    Target Date  08/03/19      PT SHORT TERM GOAL #5   Title  Pt will  negotate 8 stairs with two rails step to  sequence with supervision    Time  4    Period  Weeks    Status  Revised    Target Date  08/03/19        PT Long Term Goals - 07/04/19 1225      PT LONG TERM GOAL #1   Title  Pt will demonstrate independence with land HEP    Time  12    Period  Weeks    Status  Revised    Target Date  10/02/19      PT LONG TERM GOAL #2   Title  Pt will improve gait velocity with RW to >/= 1.8 ft/sec    Time  12    Period  Weeks    Status  Revised    Target Date  10/02/19      PT LONG TERM GOAL #3   Title  Pt will improve BERG to >/= 38/56 to decrease falls risk    Time  12    Period  Weeks    Status  Revised    Target Date  10/02/19      PT LONG TERM GOAL #4   Title  Pt will ambulate x 200' outside over pavement and grass with supervision and LRAD to improve safety ambulating to wood working shop at home    Time  12    Period  Weeks    Status  Revised    Target Date  10/02/19      PT LONG TERM GOAL #5   Title  Pt will negotiate 8 stairs with 2 rails with alternating sequence with appropriate AFO/Bioness with supervision    Time  12    Period  Weeks    Status  Revised    Target Date  10/02/19            Plan - 07/15/19 1338    Clinical Impression Statement  Pt wearing AFO today; continued to perform and reinforce NMR for core stability, grading of movement/motor planning and strengthening of hip flexors and hamstrings in supine and then in sitting.  With more compliant surfaces and in sitting with more postural demand pt continues to rely heavily on trunk extension to initiate hip flexion.  Continued to progress gait training distance - pt reports he isn't able to practice at home because he doesn't use a RW in the house - will continue to problem solve how pt can pratice and carryover at home.    Comorbidities  multiple falls, vitamin D insufficiency, Type 1 IDDM, diabetic retinopathy, hypothyroidism, HTN, hyperlipidemia, R tibia  fracture, L ankle fracture with ORIF    Rehab Potential  Fair    PT Frequency  2x / week    PT Duration  12 weeks    PT Treatment/Interventions  ADLs/Self Care Home Management;Aquatic Therapy;Electrical Stimulation;DME Instruction;Gait training;Stair training;Functional mobility training;Therapeutic activities;Therapeutic exercise;Balance training;Neuromuscular re-education;Patient/family education;Orthotic Fit/Training;Passive range of motion;Energy conservation    PT Next Visit Plan  Figure out how to perform walking with decreased UE/trunk extension at home (not using RW); Bioness on hold for now; Focus on gentle activation of hip and knee flexion in supine/standing/gait to avoid overflow to extensors; Sidelying modified plank. Quadruped or seated core strengthening.  Continue to focus on core/postural control training without UE support in standing, hip flexion and knee flexion strengthening, SLS and weight shift training (rockerboard). tall kneeling for proximal strengthening    Consulted and Agree with Plan of Care  Patient  Patient will benefit from skilled therapeutic intervention in order to improve the following deficits and impairments:  Abnormal gait, Decreased balance, Decreased endurance, Decreased coordination, Decreased range of motion, Decreased strength, Difficulty walking, Impaired tone, Postural dysfunction, Decreased activity tolerance, Decreased mobility  Visit Diagnosis: Other abnormalities of gait and mobility  Muscle weakness (generalized)  Unsteadiness on feet  Repeated falls  Difficulty in walking, not elsewhere classified  Foot drop, right  Foot drop, left     Problem List Patient Active Problem List   Diagnosis Date Noted  . Vitamin D insufficiency 11/29/2016  . Hypogonadism in male 11/01/2016  . Type 1 diabetes mellitus ([place])   . Hypothyroidism   . Hypertension   . Hyperlipidemia   . Closed fracture of right fibula and tibia 10/29/2016  .  Closed tibia fracture 10/29/2016  . Closed fracture of right tibial plafond with fibula involvement 10/29/2016  . Abnormal liver function tests 03/06/2013  . RASH AND OTHER NONSPECIFIC SKIN ERUPTION 10/07/2009  . SHOULDER PAIN, LEFT 12/27/2008  . Type I (juvenile type) diabetes mellitus without mention of complication, uncontrolled 12/03/2007  . HYPOTHYROIDISM 11/12/2007  . HYPERCHOLESTEROLEMIA 11/12/2007  . MULTIPLE SCLEROSIS 08/29/2007  . PROLIFERATIVE DIABETIC RETINOPATHY 08/29/2007  . HYPERTENSION 12/25/2006    [name], PT, DPT 07/15/19    1:44 PM    [facility] [address] [place], [place], [address] Phone: [phone]   Fax:  [phone]  Name: Johnathan Ross MRN: [id number] Date of Birth: Jul 07, 1970

## 2019-07-17 ENCOUNTER — Ambulatory Visit: Payer: Medicare Other | Admitting: Physical Therapy

## 2019-07-22 ENCOUNTER — Ambulatory Visit: Payer: Medicare Other | Admitting: Physical Therapy

## 2019-07-22 ENCOUNTER — Other Ambulatory Visit: Payer: Self-pay

## 2019-07-22 ENCOUNTER — Encounter: Payer: Self-pay | Admitting: Physical Therapy

## 2019-07-22 DIAGNOSIS — R2681 Unsteadiness on feet: Secondary | ICD-10-CM | POA: Diagnosis not present

## 2019-07-22 DIAGNOSIS — M21371 Foot drop, right foot: Secondary | ICD-10-CM | POA: Diagnosis not present

## 2019-07-22 DIAGNOSIS — M6281 Muscle weakness (generalized): Secondary | ICD-10-CM | POA: Diagnosis not present

## 2019-07-22 DIAGNOSIS — R296 Repeated falls: Secondary | ICD-10-CM

## 2019-07-22 DIAGNOSIS — R2689 Other abnormalities of gait and mobility: Secondary | ICD-10-CM | POA: Diagnosis not present

## 2019-07-22 DIAGNOSIS — R262 Difficulty in walking, not elsewhere classified: Secondary | ICD-10-CM | POA: Diagnosis not present

## 2019-07-22 DIAGNOSIS — M21372 Foot drop, left foot: Secondary | ICD-10-CM

## 2019-07-22 NOTE — Therapy (Signed)
[facility] [address] [place], [place], [address] Phone: [phone]   Fax:  [phone]  Physical Therapy Treatment  Patient Details  Name: Johnathan Ross MRN: [id number] Date of Birth: 03-26-1971 Referring Provider (PT): Carylon Perches, MD   Encounter Date: 07/22/2019  PT End of Session - 07/22/19 1143    Visit Number  33    Number of Visits  68    Date for PT Re-Evaluation  10/02/19    Authorization Type  Medicare Part A and B; 10th visit PN    PT Start Time  0851    PT Stop Time  0934    PT Time Calculation (min)  43 min    Activity Tolerance  Patient tolerated treatment well   limited by spasticity   Behavior During Therapy  [facility] for tasks assessed/performed       Past Medical History:  Diagnosis Date  . Hyperlipidemia   . Hypertension   . Hypogonadism in male 11/01/2016  . Hypothyroidism   . Multiple sclerosis ([place])   . 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 Type 1 diabetes mellitus ([place]) dx'd 1994  . Vitamin D insufficiency 11/29/2016    Past Surgical History:  Procedure Laterality Date  . EYE SURGERY Bilateral    "laser for diabetic retinopathy"  . FRACTURE SURGERY    . OPEN REDUCTION INTERNAL FIXATION (ORIF) TIBIA/FIBULA FRACTURE Right 10/30/2016  . ORIF ANKLE FRACTURE Left 2015  . ORIF TIBIA FRACTURE Right 10/30/2016   Procedure: OPEN REDUCTION INTERNAL FIXATION (ORIF) TIBIA FIBULA FRACTURE;  Surgeon: Altamese [place], MD;  Location: [facility];  Service: Orthopedics;  Laterality: Right;  . RETINAL LASER PROCEDURE Bilateral    "for diabetic retinopathy"    There were no vitals filed for this visit.  Subjective Assessment - 07/22/19 0855    Subjective  Pt saw reflexologist on Sunday, felt like the nerves in the feet were calmer.  Has been practicing walking without pushing through UE.  Walking with greater knee flexion today.    Patient is accompained by:  Family member   parents   Pertinent History  multiple falls, vitamin D insufficiency, Type 1 IDDM, diabetic retinopathy, hypothyroidism, HTN, hyperlipidemia, R tibia fracture, L ankle fracture with ORIF    Limitations  Walking    Diagnostic tests  No new lesions seen on MRI    Patient Stated Goals  To improve walking    Currently in Pain?  No/denies                       [facility] Adult PT Treatment/Exercise - 07/22/19 1130      Ambulation/Gait   Ambulation/Gait  Yes    Ambulation/Gait Assistance  4: Min guard;4: Min assist    Ambulation/Gait Assistance Details  Entering treatment area from waiting area without shoe covers - pt focused on initiating hip and knee flexion for swing and not using compensatory UE extension and swinging LE through.  Pt would experience intermittent toe drag and required standing rest break to return to active hip and knee flexion for full foot clearance.  Performed gait training at counter top with one hand on counter top and cane in LUE with heel wedge added to R shoe.  Pt able to walk down counter top with supervision with good initiation of hip flexion and forward weight shifting without genu recurvatum or trunk lean (shoe covers donned to prevent toe drag); when returning to starting point pt performed with  cane only - required min A trunk to maintain trunk control and balance while weight shifting and initiating swing phase.  Recommending that pt ambulate with one hand on counter and cane in contralateral UE, down and back counter at home.  Pt is not ready to perform with cane only.  With heel wedge in R shoe and shoe covers on - performed gait x 115' around gym with RW with therapist facilitating forward rotation at R pelvis and upright trunk with decreased lean to L.  Required intermittent standing rest breaks and pt was able to self monitor and resist compensation with UE extension.  Heel wedge did improve knee flexion at terminal stance to prepare for hip and knee flexion during  swing phase    Ambulation Distance (Feet)  115 Feet    Assistive device  Rolling walker;Straight cane;Other (Comment)   counter top   Gait Pattern  Decreased hip/knee flexion - right;Decreased hip/knee flexion - left;Decreased dorsiflexion - right;Lateral trunk lean to left;Trunk rotated posteriorly on right    Ambulation Surface  Level;Indoor      Therapeutic Activites    Therapeutic Activities  Other Therapeutic Activities    Other Therapeutic Activities  heel wedge added back into R shoe and rationale explained to pt.  Explained how heel wedge tips tibia forwards to cause a flexion moment at knee      Knee/Hip Exercises: Stretches   Gastroc Stretch  Right;Left;2 reps;30 seconds    Gastroc Stretch Limitations  foot hanging off back of step       New HEP:  Hip Flexion - Supine    Lying on back, knees bent, feet on floor, red band around thighs. Breathe out, tuck chin slightly to activate abs and then gently bring right knee up towards chest.  Bring it back down slowly.   Focus on NOT pushing through the back of the head. Repeat _10__ times per leg.   Walk Forward at [employer] with one hand on counter top and cane in opposite hand. Walk forwards down counter top, keeping chest up tall focusing on gentle hip and knee bend to swing it forwards.  When you get to the end of your counter - turn around and move the cane to the other hand.   Perform 4 laps down and back with slow, gentle steps forwards.         PT Education - 07/22/19 1127    Education Details  purpose for adding heel wedge back to R shoe to increase DF angle of Tibia to encourage knee flexion, resisted hip and knee flexion in supine with red theraband    Person(s) Educated  Patient    Methods  Explanation;Demonstration;Handout    Comprehension  Verbalized understanding;Returned demonstration       PT Short Term Goals - 07/04/19 1223      PT SHORT TERM GOAL #1   Title  Patient will  demonstrate independence with updated HEP    Time  4    Period  Weeks    Status  Revised    Target Date  08/03/19   target date extended due to pt on hold due to DVT in LUE     PT SHORT TERM GOAL #2   Title  Pt will improve gait velocity with RW to >/= 0.8 ft/sec with decreased use of UE to lift and swing RLE through    Baseline  .73 ft/sec with RW    Time  4  Period  Weeks    Status  Revised    Target Date  08/03/19      PT SHORT TERM GOAL #3   Title  Pt will improve BERG to >/= 30/56    Baseline  24/56    Time  4    Period  Weeks    Status  Revised    Target Date  08/03/19      PT SHORT TERM GOAL #4   Title  Pt will ambulate x 100' over indoor surfaces with RW and Bioness or AFO MOD I with 2 standing rest breaks and improved R hip and knee flexion and foot clearance without lifting through UE    Time  4    Period  Weeks    Status  Revised    Target Date  08/03/19      PT SHORT TERM GOAL #5   Title  Pt will negotate 8 stairs with two rails step to sequence with supervision    Time  4    Period  Weeks    Status  Revised    Target Date  08/03/19        PT Long Term Goals - 07/04/19 1225      PT LONG TERM GOAL #1   Title  Pt will demonstrate independence with land HEP    Time  12    Period  Weeks    Status  Revised    Target Date  10/02/19      PT LONG TERM GOAL #2   Title  Pt will improve gait velocity with RW to >/= 1.8 ft/sec    Time  12    Period  Weeks    Status  Revised    Target Date  10/02/19      PT LONG TERM GOAL #3   Title  Pt will improve BERG to >/= 38/56 to decrease falls risk    Time  12    Period  Weeks    Status  Revised    Target Date  10/02/19      PT LONG TERM GOAL #4   Title  Pt will ambulate x 200' outside over pavement and grass with supervision and LRAD to improve safety ambulating to wood working shop at home    Time  12    Period  Weeks    Status  Revised    Target Date  10/02/19      PT LONG TERM GOAL #5   Title  Pt  will negotiate 8 stairs with 2 rails with alternating sequence with appropriate AFO/Bioness with supervision    Time  12    Period  Weeks    Status  Revised    Target Date  10/02/19            Plan - 07/22/19 1144    Clinical Impression Statement  Continued to focus on gait training with use of cane and counter top to practice and carryover gait technique at home; returned heel wedge to R shoe which did improve knee flexion moment to prepare for swing phase.  Pt is demonstrating carryover with ambulation into and when leaving treatment area.  Progressed supine hip/knee flexion exercise by adding resistance.  Will continue to progress towards LTG.    Comorbidities  multiple falls, vitamin D insufficiency, Type 1 IDDM, diabetic retinopathy, hypothyroidism, HTN, hyperlipidemia, R tibia fracture, L ankle fracture with ORIF    Rehab Potential  Fair    PT Frequency  2x / week    PT Duration  12 weeks    PT Treatment/Interventions  ADLs/Self Care Home Management;Aquatic Therapy;Electrical Stimulation;DME Instruction;Gait training;Stair training;Functional mobility training;Therapeutic activities;Therapeutic exercise;Balance training;Neuromuscular re-education;Patient/family education;Orthotic Fit/Training;Passive range of motion;Energy conservation    PT Next Visit Plan  How did it go walking with cane and counter top?  Keep heel wedge in R shoe, add to L?  Review supine resisted hip/knee flexion.  Bioness on hold for now; Focus on gentle activation of hip and knee flexion in supine/standing/gait to avoid overflow to extensors; Sidelying modified plank. Quadruped or seated core strengthening.  Continue to focus on core/postural control training without UE support in standing, hip flexion and knee flexion strengthening, SLS and weight shift training (rockerboard). tall kneeling for proximal strengthening    Consulted and Agree with Plan of Care  Patient       Patient will benefit from skilled  therapeutic intervention in order to improve the following deficits and impairments:  Abnormal gait, Decreased balance, Decreased endurance, Decreased coordination, Decreased range of motion, Decreased strength, Difficulty walking, Impaired tone, Postural dysfunction, Decreased activity tolerance, Decreased mobility  Visit Diagnosis: Other abnormalities of gait and mobility  Muscle weakness (generalized)  Unsteadiness on feet  Repeated falls  Difficulty in walking, not elsewhere classified  Foot drop, right  Foot drop, left     Problem List Patient Active Problem List   Diagnosis Date Noted  . Vitamin D insufficiency 11/29/2016  . Hypogonadism in male 11/01/2016  . Type 1 diabetes mellitus ([place])   . Hypothyroidism   . Hypertension   . Hyperlipidemia   . Closed fracture of right fibula and tibia 10/29/2016  . Closed tibia fracture 10/29/2016  . Closed fracture of right tibial plafond with fibula involvement 10/29/2016  . Abnormal liver function tests 03/06/2013  . RASH AND OTHER NONSPECIFIC SKIN ERUPTION 10/07/2009  . SHOULDER PAIN, LEFT 12/27/2008  . Type I (juvenile type) diabetes mellitus without mention of complication, uncontrolled 12/03/2007  . HYPOTHYROIDISM 11/12/2007  . HYPERCHOLESTEROLEMIA 11/12/2007  . MULTIPLE SCLEROSIS 08/29/2007  . PROLIFERATIVE DIABETIC RETINOPATHY 08/29/2007  . HYPERTENSION 12/25/2006    [name], PT, DPT 07/22/19    11:53 AM    [facility] [address] [place], [place], [address] Phone: [phone]   Fax:  [phone]  Name: RASHAWN HABICHT MRN: [id number] Date of Birth: 1970-11-13

## 2019-07-22 NOTE — Patient Instructions (Addendum)
New HEP:  Hip Flexion - Supine    Lying on back, knees bent, feet on floor, red band around thighs. Breathe out, tuck chin slightly to activate abs and then gently bring right knee up towards chest.  Bring it back down slowly.   Focus on NOT pushing through the back of the head. Repeat _10__ times per leg.   Walk Forward at [employer] with one hand on counter top and cane in opposite hand. Walk forwards down counter top, keeping chest up tall focusing on gentle hip and knee bend to swing it forwards.  When you get to the end of your counter - turn around and move the cane to the other hand.   Perform 4 laps down and back with slow, gentle steps forwards.

## 2019-07-24 ENCOUNTER — Ambulatory Visit: Payer: Medicare Other | Admitting: Physical Therapy

## 2019-07-24 ENCOUNTER — Encounter: Payer: Self-pay | Admitting: Physical Therapy

## 2019-07-24 ENCOUNTER — Other Ambulatory Visit: Payer: Self-pay

## 2019-07-24 DIAGNOSIS — R2681 Unsteadiness on feet: Secondary | ICD-10-CM

## 2019-07-24 DIAGNOSIS — R262 Difficulty in walking, not elsewhere classified: Secondary | ICD-10-CM | POA: Diagnosis not present

## 2019-07-24 DIAGNOSIS — M6281 Muscle weakness (generalized): Secondary | ICD-10-CM

## 2019-07-24 DIAGNOSIS — M21371 Foot drop, right foot: Secondary | ICD-10-CM | POA: Diagnosis not present

## 2019-07-24 DIAGNOSIS — R296 Repeated falls: Secondary | ICD-10-CM | POA: Diagnosis not present

## 2019-07-24 DIAGNOSIS — R2689 Other abnormalities of gait and mobility: Secondary | ICD-10-CM

## 2019-07-24 NOTE — Therapy (Signed)
[facility] [address] [place], [place], [address] Phone: [phone]   Fax:  [phone]  Physical Therapy Treatment  Patient Details  Name: Johnathan Ross MRN: [id number] Date of Birth: February 22, 1971 Referring Provider (PT): Carylon Perches, MD   Encounter Date: 07/24/2019  PT End of Session - 07/24/19 0853    Visit Number  34    Number of Visits  71    Date for PT Re-Evaluation  10/02/19    Authorization Type  Medicare Part A and B; 10th visit PN    PT Start Time  0846    PT Stop Time  0930    PT Time Calculation (min)  44 min    Equipment Utilized During Treatment  Gait belt    Activity Tolerance  Patient tolerated treatment well   limited by spasticity   Behavior During Therapy  [facility] for tasks assessed/performed       Past Medical History:  Diagnosis Date  . Hyperlipidemia   . Hypertension   . Hypogonadism in male 11/01/2016  . Hypothyroidism   . Multiple sclerosis ([place])   . 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 Type 1 diabetes mellitus ([place]) dx'd 1994  . Vitamin D insufficiency 11/29/2016    Past Surgical History:  Procedure Laterality Date  . EYE SURGERY Bilateral    "laser for diabetic retinopathy"  . FRACTURE SURGERY    . OPEN REDUCTION INTERNAL FIXATION (ORIF) TIBIA/FIBULA FRACTURE Right 10/30/2016  . ORIF ANKLE FRACTURE Left 2015  . ORIF TIBIA FRACTURE Right 10/30/2016   Procedure: OPEN REDUCTION INTERNAL FIXATION (ORIF) TIBIA FIBULA FRACTURE;  Surgeon: Altamese Owyhee, MD;  Location: [facility];  Service: Orthopedics;  Laterality: Right;  . RETINAL LASER PROCEDURE Bilateral    "for diabetic retinopathy"    There were no vitals filed for this visit.  Subjective Assessment - 07/24/19 0851    Subjective  No new complaints. No falls. Did practice use of cane at counter at home.    Patient is accompained by:  Family member   parents in car   Pertinent History  multiple falls, vitamin D  insufficiency, Type 1 IDDM, diabetic retinopathy, hypothyroidism, HTN, hyperlipidemia, R tibia fracture, L ankle fracture with ORIF    Limitations  Walking    Diagnostic tests  No new lesions seen on MRI    Patient Stated Goals  To improve walking    Currently in Pain?  No/denies            [facility] Adult PT Treatment/Exercise - 07/24/19 0854      Transfers   Transfers  Sit to Stand;Stand to Sit    Sit to Stand  5: Supervision;With upper extremity assist    Stand to Sit  5: Supervision;With upper extremity assist      Ambulation/Gait   Ambulation/Gait  Yes    Ambulation/Gait Assistance  4: Min guard    Ambulation/Gait Assistance Details  working on decreased UE push on RW, emphasis on slowing down to initiate at hip for hip/knee flexion with swing phase of gait.     Ambulation Distance (Feet)  115 Feet   x1, 80 x2   Assistive device  Rolling walker;Straight cane;Other (Comment)    Gait Pattern  Decreased hip/knee flexion - right;Decreased hip/knee flexion - left;Decreased dorsiflexion - right;Lateral trunk lean to left;Trunk rotated posteriorly on right    Ambulation Surface  Level;Indoor      Neuro Re-ed    Neuro Re-ed Details   for  strengthening/muscle re-ed: tall kneeling on red mat- mini squats with CP with 2# ball for 10 reps with cues on full return/controlled movements, upper trunk roation reaching up over one shoulder/then down to mat on opposite side for 10 reps each side with min assist for trunk stability with movements, then sustained pertubarions with dowel rod in varied directions with pt needing time to regain balance at times; in standing with RW support/second person for balance assistance red band resisted forward stepping for hip/knee flexion to 2 inch box for 10 reps each side. PTA using band to apply graded resistance so to allow muscles to initiate contraction then little to no resistance for remainder of motions; in standing with second person for safety PTA applied  sustained pertubations in various directions in with no UE support for 2-3 minutes with need for pt to regain balance at times.            PT Short Term Goals - 07/04/19 1223      PT SHORT TERM GOAL #1   Title  Patient will demonstrate independence with updated HEP    Time  4    Period  Weeks    Status  Revised    Target Date  08/03/19   target date extended due to pt on hold due to DVT in LUE     PT SHORT TERM GOAL #2   Title  Pt will improve gait velocity with RW to >/= 0.8 ft/sec with decreased use of UE to lift and swing RLE through    Baseline  .7 ft/sec with RW    Time  4    Period  Weeks    Status  Revised    Target Date  08/03/19      PT SHORT TERM GOAL #3   Title  Pt will improve BERG to >/= 30/56    Baseline  24/56    Time  4    Period  Weeks    Status  Revised    Target Date  08/03/19      PT SHORT TERM GOAL #4   Title  Pt will ambulate x 100' over indoor surfaces with RW and Bioness or AFO MOD I with 2 standing rest breaks and improved R hip and knee flexion and foot clearance without lifting through UE    Time  4    Period  Weeks    Status  Revised    Target Date  08/03/19      PT SHORT TERM GOAL #5   Title  Pt will negotate 8 stairs with two rails step to sequence with supervision    Time  4    Period  Weeks    Status  Revised    Target Date  08/03/19        PT Long Term Goals - 07/04/19 1225      PT LONG TERM GOAL #1   Title  Pt will demonstrate independence with land HEP    Time  12    Period  Weeks    Status  Revised    Target Date  10/02/19      PT LONG TERM GOAL #2   Title  Pt will improve gait velocity with RW to >/= 1.8 ft/sec    Time  12    Period  Weeks    Status  Revised    Target Date  10/02/19      PT LONG TERM GOAL #3   Title  Pt  will improve BERG to >/= 38/56 to decrease falls risk    Time  12    Period  Weeks    Status  Revised    Target Date  10/02/19      PT LONG TERM GOAL #4   Title  Pt will ambulate x 200'  outside over pavement and grass with supervision and LRAD to improve safety ambulating to wood working shop at home    Time  12    Period  Weeks    Status  Revised    Target Date  10/02/19      PT LONG TERM GOAL #5   Title  Pt will negotiate 8 stairs with 2 rails with alternating sequence with appropriate AFO/Bioness with supervision    Time  12    Period  Weeks    Status  Revised    Target Date  10/02/19            Plan - 07/24/19 0854    Clinical Impression Statement  Today's skilled session continued to focus on gait with emphasis on hip/knee flexion with swing phase, core strengthening and LE strengthening. No issues other than fatigue with session with rest breaks taken. The pt is progressing toward goals and should benefit from continued PT to progress toward unmet goals.    Comorbidities  multiple falls, vitamin D insufficiency, Type 1 IDDM, diabetic retinopathy, hypothyroidism, HTN, hyperlipidemia, R tibia fracture, L ankle fracture with ORIF    Rehab Potential  Fair    PT Frequency  2x / week    PT Duration  12 weeks    PT Treatment/Interventions  ADLs/Self Care Home Management;Aquatic Therapy;Electrical Stimulation;DME Instruction;Gait training;Stair training;Functional mobility training;Therapeutic activities;Therapeutic exercise;Balance training;Neuromuscular re-education;Patient/family education;Orthotic Fit/Training;Passive range of motion;Energy conservation    PT Next Visit Plan  Keep heel wedge in R shoe, add to L?  Review supine resisted hip/knee flexion.  Bioness on hold for now; Focus on gentle activation of hip and knee flexion in supine/standing/gait to avoid overflow to extensors; Sidelying modified plank. Quadruped or seated core strengthening.  Continue to focus on core/postural control training without UE support in standing, hip flexion and knee flexion strengthening, SLS and weight shift training (rockerboard). tall kneeling for proximal strengthening     Consulted and Agree with Plan of Care  Patient       Patient will benefit from skilled therapeutic intervention in order to improve the following deficits and impairments:  Abnormal gait, Decreased balance, Decreased endurance, Decreased coordination, Decreased range of motion, Decreased strength, Difficulty walking, Impaired tone, Postural dysfunction, Decreased activity tolerance, Decreased mobility  Visit Diagnosis: Other abnormalities of gait and mobility  Muscle weakness (generalized)  Unsteadiness on feet     Problem List Patient Active Problem List   Diagnosis Date Noted  . Vitamin D insufficiency 11/29/2016  . Hypogonadism in male 11/01/2016  . Type 1 diabetes mellitus ([place])   . Hypothyroidism   . Hypertension   . Hyperlipidemia   . Closed fracture of right fibula and tibia 10/29/2016  . Closed tibia fracture 10/29/2016  . Closed fracture of right tibial plafond with fibula involvement 10/29/2016  . Abnormal liver function tests 03/06/2013  . RASH AND OTHER NONSPECIFIC SKIN ERUPTION 10/07/2009  . SHOULDER PAIN, LEFT 12/27/2008  . Type I (juvenile type) diabetes mellitus without mention of complication, uncontrolled 12/03/2007  . HYPOTHYROIDISM 11/12/2007  . HYPERCHOLESTEROLEMIA 11/12/2007  . MULTIPLE SCLEROSIS 08/29/2007  . PROLIFERATIVE DIABETIC RETINOPATHY 08/29/2007  . HYPERTENSION 12/25/2006  Willow Ora, PTA, [facility] [address] [place], [place] [address] [phone] 07/24/19, 6:27 PM   Name: RIDHWAN SASEK MRN: [id number] Date of Birth: 11/02/1970

## 2019-07-27 DIAGNOSIS — G35 Multiple sclerosis: Secondary | ICD-10-CM | POA: Diagnosis not present

## 2019-07-28 LAB — CBC: RBC: 5.59 — AB (ref 3.87–5.11)

## 2019-07-28 LAB — CBC AND DIFFERENTIAL
HCT: 48 (ref 41–53)
Hemoglobin: 15.6 (ref 13.5–17.5)
WBC: 7.3

## 2019-07-28 LAB — HEPATIC FUNCTION PANEL
ALT: 24 (ref 10–40)
AST: 24 (ref 14–40)
Bilirubin, Total: 0.4

## 2019-07-28 LAB — COMPREHENSIVE METABOLIC PANEL
GFR calc Af Amer: 87
GFR calc non Af Amer: 75

## 2019-07-28 LAB — BASIC METABOLIC PANEL: Creatinine: 1.2 (ref <=1.3)

## 2019-07-29 ENCOUNTER — Ambulatory Visit: Payer: Medicare Other | Attending: Urology | Admitting: Physical Therapy

## 2019-07-29 ENCOUNTER — Other Ambulatory Visit: Payer: Self-pay | Admitting: Family

## 2019-07-29 ENCOUNTER — Other Ambulatory Visit: Payer: Self-pay

## 2019-07-29 ENCOUNTER — Telehealth: Payer: Self-pay | Admitting: Family

## 2019-07-29 ENCOUNTER — Encounter: Payer: Self-pay | Admitting: Physical Therapy

## 2019-07-29 DIAGNOSIS — R296 Repeated falls: Secondary | ICD-10-CM | POA: Insufficient documentation

## 2019-07-29 DIAGNOSIS — M6281 Muscle weakness (generalized): Secondary | ICD-10-CM

## 2019-07-29 DIAGNOSIS — G35 Multiple sclerosis: Secondary | ICD-10-CM

## 2019-07-29 DIAGNOSIS — R2689 Other abnormalities of gait and mobility: Secondary | ICD-10-CM | POA: Diagnosis not present

## 2019-07-29 DIAGNOSIS — M21372 Foot drop, left foot: Secondary | ICD-10-CM | POA: Diagnosis not present

## 2019-07-29 DIAGNOSIS — R262 Difficulty in walking, not elsewhere classified: Secondary | ICD-10-CM | POA: Insufficient documentation

## 2019-07-29 DIAGNOSIS — M21371 Foot drop, right foot: Secondary | ICD-10-CM | POA: Diagnosis not present

## 2019-07-29 DIAGNOSIS — R2681 Unsteadiness on feet: Secondary | ICD-10-CM | POA: Diagnosis not present

## 2019-07-29 NOTE — Therapy (Signed)
[facility] [address] [place], [place], [address] Phone: [phone]   Fax:  [phone]  Physical Therapy Treatment  Patient Details  Name: Johnathan Ross MRN: [id number] Date of Birth: 06-21-1970 Referring Provider (PT): Carylon Perches, MD   Encounter Date: 07/29/2019  PT End of Session - 07/29/19 0852    Visit Number  35    Number of Visits  53    Date for PT Re-Evaluation  10/02/19    Authorization Type  Medicare Part A and B; 10th visit PN    PT Start Time  0848    PT Stop Time  0930    PT Time Calculation (min)  42 min    Equipment Utilized During Treatment  Gait belt    Activity Tolerance  Patient tolerated treatment well   limited by spasticity   Behavior During Therapy  WFL for tasks assessed/performed       Past Medical History:  Diagnosis Date  . Hyperlipidemia   . Hypertension   . Hypogonadism in male 11/01/2016  . Hypothyroidism   . Multiple sclerosis ([place])   . 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 Type 1 diabetes mellitus ([place]) dx'd 1994  . Vitamin D insufficiency 11/29/2016    Past Surgical History:  Procedure Laterality Date  . EYE SURGERY Bilateral    "laser for diabetic retinopathy"  . FRACTURE SURGERY    . OPEN REDUCTION INTERNAL FIXATION (ORIF) TIBIA/FIBULA FRACTURE Right 10/30/2016  . ORIF ANKLE FRACTURE Left 2015  . ORIF TIBIA FRACTURE Right 10/30/2016   Procedure: OPEN REDUCTION INTERNAL FIXATION (ORIF) TIBIA FIBULA FRACTURE;  Surgeon: Altamese Winchester, MD;  Location: [facility];  Service: Orthopedics;  Laterality: Right;  . RETINAL LASER PROCEDURE Bilateral    "for diabetic retinopathy"    There were no vitals filed for this visit.  Subjective Assessment - 07/29/19 0851    Subjective  Had MRI of brain/spine on Monday. Results showed only the old lesions, no new ones. No falls or pain to report.    Patient is accompained by:  Family member   parents in car   Pertinent  History  multiple falls, vitamin D insufficiency, Type 1 IDDM, diabetic retinopathy, hypothyroidism, HTN, hyperlipidemia, R tibia fracture, L ankle fracture with ORIF    Limitations  Walking    Diagnostic tests  No new lesions seen on MRI    Patient Stated Goals  To improve walking    Currently in Pain?  No/denies    Pain Score  0-No pain           [facility] Adult PT Treatment/Exercise - 07/29/19 0853      Transfers   Transfers  Sit to Stand;Stand to Sit    Sit to Stand  5: Supervision;With upper extremity assist    Stand to Sit  5: Supervision;With upper extremity assist    Floor to Transfer  5: Supervision;With upper extremity assist    Floor to Transfer Details (indicate cue type and reason)  to/from red mat on floor next to mat table with UE assistance.       Ambulation/Gait   Ambulation/Gait  Yes    Ambulation/Gait Assistance  4: Min guard    Ambulation/Gait Assistance Details  cues for relaxed UE's, increased hip/knee flexion with emphasis on slowing down to allow for hip flexors to "kick" in without setting off extensor tone. cues with use of walker into/out of/around track with min guard assist; use of cane next to counter for  several laps with min guard to min assist, other hand lightly gliding on counter surface. Cues on cane placement, sequencing and posture. With all gait pt needed cues for shorter step length on right, increased on left.     Ambulation Distance (Feet)  115 Feet   x1, 80x2, laps at counter   Assistive device  Rolling walker;Straight cane;Other (Comment)    Gait Pattern  Decreased hip/knee flexion - right;Decreased hip/knee flexion - left;Decreased dorsiflexion - right;Lateral trunk lean to left;Trunk rotated posteriorly on right    Ambulation Surface  Level;Indoor      Neuro Re-ed    Neuro Re-ed Details   for strengthening/muscle re-ed: tall kneeling on red mat next to mat table- with 2# weighted ball for mini squats with CP for 10 reps; in tall kneeling with  2# weighted ball for upper trunk rotation- lifting up over one shoulder<>down to mat surface on opposite side for 10 reps each side. cues for trunk rotation with the motion, min guard to min assist for balance; then in half kneeling (increased assist for stability with right foot fwd vs left foot fwd): alternating UE raises for 5 reps each side, bil UE raises with hands together for 5 reps. min to mod assist for balance/stability with cues on form and technique.            PT Short Term Goals - 07/04/19 1223      PT SHORT TERM GOAL #1   Title  Patient will demonstrate independence with updated HEP    Time  4    Period  Weeks    Status  Revised    Target Date  08/03/19   target date extended due to pt on hold due to DVT in LUE     PT SHORT TERM GOAL #2   Title  Pt will improve gait velocity with RW to >/= 0.8 ft/sec with decreased use of UE to lift and swing RLE through    Baseline  .27 ft/sec with RW    Time  4    Period  Weeks    Status  Revised    Target Date  08/03/19      PT SHORT TERM GOAL #3   Title  Pt will improve BERG to >/= 30/56    Baseline  24/56    Time  4    Period  Weeks    Status  Revised    Target Date  08/03/19      PT SHORT TERM GOAL #4   Title  Pt will ambulate x 100' over indoor surfaces with RW and Bioness or AFO MOD I with 2 standing rest breaks and improved R hip and knee flexion and foot clearance without lifting through UE    Time  4    Period  Weeks    Status  Revised    Target Date  08/03/19      PT SHORT TERM GOAL #5   Title  Pt will negotate 8 stairs with two rails step to sequence with supervision    Time  4    Period  Weeks    Status  Revised    Target Date  08/03/19        PT Long Term Goals - 07/04/19 1225      PT LONG TERM GOAL #1   Title  Pt will demonstrate independence with land HEP    Time  12    Period  Weeks    Status  Revised    Target Date  10/02/19      PT LONG TERM GOAL #2   Title  Pt will improve gait velocity  with RW to >/= 1.8 ft/sec    Time  12    Period  Weeks    Status  Revised    Target Date  10/02/19      PT LONG TERM GOAL #3   Title  Pt will improve BERG to >/= 38/56 to decrease falls risk    Time  12    Period  Weeks    Status  Revised    Target Date  10/02/19      PT LONG TERM GOAL #4   Title  Pt will ambulate x 200' outside over pavement and grass with supervision and LRAD to improve safety ambulating to wood working shop at home    Time  12    Period  Weeks    Status  Revised    Target Date  10/02/19      PT LONG TERM GOAL #5   Title  Pt will negotiate 8 stairs with 2 rails with alternating sequence with appropriate AFO/Bioness with supervision    Time  12    Period  Weeks    Status  Revised    Target Date  10/02/19            Plan - 07/29/19 0852    Clinical Impression Statement  Today's skilled session continued to focus on slow, relaxed posture with gait with both RW and cane. Pt reverts back to "stiff" UE's at times, able to relax and slow down again when cued. Also continued to focus on core strengthening with emphasis on flexion based ex's. Rest breaks needed due to fatigue, otherwise no issues reported or noted in session. The pt is progressing well and should benefit from continued PT to progress toward unmet goals.    Comorbidities  multiple falls, vitamin D insufficiency, Type 1 IDDM, diabetic retinopathy, hypothyroidism, HTN, hyperlipidemia, R tibia fracture, L ankle fracture with ORIF    Rehab Potential  Fair    PT Frequency  2x / week    PT Duration  12 weeks    PT Treatment/Interventions  ADLs/Self Care Home Management;Aquatic Therapy;Electrical Stimulation;DME Instruction;Gait training;Stair training;Functional mobility training;Therapeutic activities;Therapeutic exercise;Balance training;Neuromuscular re-education;Patient/family education;Orthotic Fit/Training;Passive range of motion;Energy conservation    PT Next Visit Plan  Keep heel wedge in R shoe,  add to L?  Review supine resisted hip/knee flexion.  Bioness on hold for now; Focus on gentle activation of hip and knee flexion in supine/standing/gait to avoid overflow to extensors; Sidelying modified plank. Quadruped or seated core strengthening.  Continue to focus on core/postural control training without UE support in standing, hip flexion and knee flexion strengthening, SLS and weight shift training (rockerboard). tall kneeling for proximal strengthening    Consulted and Agree with Plan of Care  Patient       Patient will benefit from skilled therapeutic intervention in order to improve the following deficits and impairments:  Abnormal gait, Decreased balance, Decreased endurance, Decreased coordination, Decreased range of motion, Decreased strength, Difficulty walking, Impaired tone, Postural dysfunction, Decreased activity tolerance, Decreased mobility  Visit Diagnosis: Other abnormalities of gait and mobility  Muscle weakness (generalized)  Unsteadiness on feet  Difficulty in walking, not elsewhere classified     Problem List Patient Active Problem List   Diagnosis Date Noted  . Vitamin D insufficiency 11/29/2016  . Hypogonadism in male 11/01/2016  .  Type 1 diabetes mellitus ([place])   . Hypothyroidism   . Hypertension   . Hyperlipidemia   . Closed fracture of right fibula and tibia 10/29/2016  . Closed tibia fracture 10/29/2016  . Closed fracture of right tibial plafond with fibula involvement 10/29/2016  . Abnormal liver function tests 03/06/2013  . RASH AND OTHER NONSPECIFIC SKIN ERUPTION 10/07/2009  . SHOULDER PAIN, LEFT 12/27/2008  . Type I (juvenile type) diabetes mellitus without mention of complication, uncontrolled 12/03/2007  . HYPOTHYROIDISM 11/12/2007  . HYPERCHOLESTEROLEMIA 11/12/2007  . MULTIPLE SCLEROSIS 08/29/2007  . PROLIFERATIVE DIABETIC RETINOPATHY 08/29/2007  . HYPERTENSION 12/25/2006    Willow Ora, PTA, [facility] [address] [place], [place] [address] [phone] 07/29/19, 2:47 PM   Name: Johnathan Ross MRN: [id number] Date of Birth: 10-Sep-1970

## 2019-07-29 NOTE — Telephone Encounter (Signed)
New message:   Pt's sister is calling and states they would like a new referral to a Neurology Dr. She states they would like for the Dr. To be more pro-active about her brother's MS. She also states that he goes to a place in [place] and would like something closer. She suggested [facility] for an referral site.

## 2019-07-30 ENCOUNTER — Encounter: Payer: Self-pay | Admitting: Physical Therapy

## 2019-07-30 ENCOUNTER — Ambulatory Visit: Payer: Medicare Other | Admitting: Physical Therapy

## 2019-07-30 DIAGNOSIS — M6281 Muscle weakness (generalized): Secondary | ICD-10-CM | POA: Diagnosis not present

## 2019-07-30 DIAGNOSIS — R262 Difficulty in walking, not elsewhere classified: Secondary | ICD-10-CM | POA: Diagnosis not present

## 2019-07-30 DIAGNOSIS — R2689 Other abnormalities of gait and mobility: Secondary | ICD-10-CM

## 2019-07-30 DIAGNOSIS — M21371 Foot drop, right foot: Secondary | ICD-10-CM

## 2019-07-30 DIAGNOSIS — R2681 Unsteadiness on feet: Secondary | ICD-10-CM | POA: Diagnosis not present

## 2019-07-30 DIAGNOSIS — R296 Repeated falls: Secondary | ICD-10-CM

## 2019-07-30 DIAGNOSIS — M21372 Foot drop, left foot: Secondary | ICD-10-CM | POA: Diagnosis not present

## 2019-07-30 NOTE — Therapy (Signed)
[facility] [address] [place], [place], [address] Phone: [phone]   Fax:  [phone]  Physical Therapy Treatment  Patient Details  Name: Johnathan Ross MRN: [id number] Date of Birth: [birth year] Referring Provider (PT): Carylon Perches, MD   Encounter Date: 07/30/2019  PT End of Session - 07/30/19 1214    Visit Number  36    Number of Visits  31    Date for PT Re-Evaluation  10/02/19    Authorization Type  Medicare Part A and B; 10th visit PN    Progress Note Due on Visit  31    PT Start Time  0848    PT Stop Time  0935    PT Time Calculation (min)  47 min    Activity Tolerance  Patient tolerated treatment well   limited by spasticity   Behavior During Therapy  [facility] for tasks assessed/performed       Past Medical History:  Diagnosis Date  . Hyperlipidemia   . Hypertension   . Hypogonadism in male 11/01/2016  . Hypothyroidism   . Multiple sclerosis ([place])   . 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 Type 1 diabetes mellitus ([place]) dx'd 1994  . Vitamin D insufficiency 11/29/2016    Past Surgical History:  Procedure Laterality Date  . EYE SURGERY Bilateral    "laser for diabetic retinopathy"  . FRACTURE SURGERY    . OPEN REDUCTION INTERNAL FIXATION (ORIF) TIBIA/FIBULA FRACTURE Right 10/30/2016  . ORIF ANKLE FRACTURE Left 2015  . ORIF TIBIA FRACTURE Right 10/30/2016   Procedure: OPEN REDUCTION INTERNAL FIXATION (ORIF) TIBIA FIBULA FRACTURE;  Surgeon: Altamese [place], MD;  Location: [facility];  Service: Orthopedics;  Laterality: Right;  . RETINAL LASER PROCEDURE Bilateral    "for diabetic retinopathy"    There were no vitals filed for this visit.  Subjective Assessment - 07/30/19 0852    Subjective  Had a good work out yesterday, still practicing with cane and counter at home    Patient is accompained by:  Family member   parents in car   Pertinent History  multiple falls, vitamin D insufficiency,  Type 1 IDDM, diabetic retinopathy, hypothyroidism, HTN, hyperlipidemia, R tibia fracture, L ankle fracture with ORIF    Limitations  Walking    Diagnostic tests  No new lesions seen on MRI    Patient Stated Goals  To improve walking    Currently in Pain?  No/denies                       [facility] Adult PT Treatment/Exercise - 07/30/19 0854      Ambulation/Gait   Ambulation/Gait  Yes    Ambulation/Gait Assistance  4: Min assist    Ambulation/Gait Assistance Details  did not wear shoe covers today so slight increase in toe drag noted.  Continued to focus on UE resting on RW for balance only; therapist provided facilitation of weight shifting, trunk control, pelvic rotation and initiating knee flexion during terminal stance to allow pt to perform active hip and knee flexion to advance    Ambulation Distance (Feet)  200 Feet    Assistive device  Rolling walker    Gait Pattern  Decreased hip/knee flexion - right;Decreased hip/knee flexion - left;Decreased dorsiflexion - right;Lateral trunk lean to left;Step-through pattern;Right genu recurvatum;Poor foot clearance - right    Ambulation Surface  Level;Indoor      Neuro Re-ed    Neuro Re-ed Details   Returned to  tall kneeling on floor and performed 10 reps each with 2lb medicine ball: vertical chops, diagonal chops to each side and rotation while performing tall kneeling squats in midline and then laterally to L and R.  Min A overall for support/balance.  Transitioned to quadruped and performed alternating knee flexion/hip flexion to bring knee forwards towards elbow x 10 reps with focus on core activation.  Transitioned to sidelying propped up on elbow and performed 10 reps each side modified side plank with 3 second hold with hips and knees flexed; cues to prevent excessive trunk rotation               PT Short Term Goals - 07/04/19 1223      PT SHORT TERM GOAL #1   Title  Patient will demonstrate independence with updated  HEP    Time  4    Period  Weeks    Status  Revised    Target Date  08/03/19   target date extended due to pt on hold due to DVT in LUE     PT SHORT TERM GOAL #2   Title  Pt will improve gait velocity with RW to >/= 0.8 ft/sec with decreased use of UE to lift and swing RLE through    Baseline  .50 ft/sec with RW    Time  4    Period  Weeks    Status  Revised    Target Date  08/03/19      PT SHORT TERM GOAL #3   Title  Pt will improve BERG to >/= 30/56    Baseline  24/56    Time  4    Period  Weeks    Status  Revised    Target Date  08/03/19      PT SHORT TERM GOAL #4   Title  Pt will ambulate x 100' over indoor surfaces with RW and Bioness or AFO MOD I with 2 standing rest breaks and improved R hip and knee flexion and foot clearance without lifting through UE    Time  4    Period  Weeks    Status  Revised    Target Date  08/03/19      PT SHORT TERM GOAL #5   Title  Pt will negotate 8 stairs with two rails step to sequence with supervision    Time  4    Period  Weeks    Status  Revised    Target Date  08/03/19        PT Long Term Goals - 07/04/19 1225      PT LONG TERM GOAL #1   Title  Pt will demonstrate independence with land HEP    Time  12    Period  Weeks    Status  Revised    Target Date  10/02/19      PT LONG TERM GOAL #2   Title  Pt will improve gait velocity with RW to >/= 1.8 ft/sec    Time  12    Period  Weeks    Status  Revised    Target Date  10/02/19      PT LONG TERM GOAL #3   Title  Pt will improve BERG to >/= 38/56 to decrease falls risk    Time  12    Period  Weeks    Status  Revised    Target Date  10/02/19      PT LONG TERM GOAL #4  Title  Pt will ambulate x 200' outside over pavement and grass with supervision and LRAD to improve safety ambulating to wood working shop at home    Time  12    Period  Weeks    Status  Revised    Target Date  10/02/19      PT LONG TERM GOAL #5   Title  Pt will negotiate 8 stairs with 2 rails with  alternating sequence with appropriate AFO/Bioness with supervision    Time  12    Period  Weeks    Status  Revised    Target Date  10/02/19            Plan - 07/30/19 1214    Clinical Impression Statement  Continued to utilize tall kneeling, quadruped and sidelying for NMR and core activation training for proximal stability with decreased UE support.  Continued to focus on gait with UE for balance only and motor planning/activation of hip and knee flexion from terminal stance > heel strike - without shoe covers pt continues to catch R toe leading to increase in extensor tone - may discuss leather to caps with pt.    Comorbidities  multiple falls, vitamin D insufficiency, Type 1 IDDM, diabetic retinopathy, hypothyroidism, HTN, hyperlipidemia, R tibia fracture, L ankle fracture with ORIF    Rehab Potential  Fair    PT Frequency  2x / week    PT Duration  12 weeks    PT Treatment/Interventions  ADLs/Self Care Home Management;Aquatic Therapy;Electrical Stimulation;DME Instruction;Gait training;Stair training;Functional mobility training;Therapeutic activities;Therapeutic exercise;Balance training;Neuromuscular re-education;Patient/family education;Orthotic Fit/Training;Passive range of motion;Energy conservation    PT Next Visit Plan  CHECK STG.  Bioness on hold for now; Standing resisted hip/knee flexion, walking with resistance?  Focus on gentle activation of hip and knee flexion in supine/standing/gait to avoid overflow to extensors, gait at counter with cane.  Quadruped or seated core strengthening.  Continue to focus on core/postural control training without UE support in standing, hip flexion and knee flexion strengthening, SLS and weight shift training (rockerboard). tall kneeling for proximal strengthening    Consulted and Agree with Plan of Care  Patient       Patient will benefit from skilled therapeutic intervention in order to improve the following deficits and impairments:  Abnormal  gait, Decreased balance, Decreased endurance, Decreased coordination, Decreased range of motion, Decreased strength, Difficulty walking, Impaired tone, Postural dysfunction, Decreased activity tolerance, Decreased mobility  Visit Diagnosis: Other abnormalities of gait and mobility  Muscle weakness (generalized)  Unsteadiness on feet  Difficulty in walking, not elsewhere classified  Repeated falls  Foot drop, right  Foot drop, left     Problem List Patient Active Problem List   Diagnosis Date Noted  . Vitamin D insufficiency 11/29/2016  . Hypogonadism in male 11/01/2016  . Type 1 diabetes mellitus ([place])   . Hypothyroidism   . Hypertension   . Hyperlipidemia   . Closed fracture of right fibula and tibia 10/29/2016  . Closed tibia fracture 10/29/2016  . Closed fracture of right tibial plafond with fibula involvement 10/29/2016  . Abnormal liver function tests 03/06/2013  . RASH AND OTHER NONSPECIFIC SKIN ERUPTION 10/07/2009  . SHOULDER PAIN, LEFT 12/27/2008  . Type I (juvenile type) diabetes mellitus without mention of complication, uncontrolled 12/03/2007  . HYPOTHYROIDISM 11/12/2007  . HYPERCHOLESTEROLEMIA 11/12/2007  . MULTIPLE SCLEROSIS 08/29/2007  . PROLIFERATIVE DIABETIC RETINOPATHY 08/29/2007  . HYPERTENSION 12/25/2006    [name], PT, DPT 07/30/19    12:19  PM    [facility] [address] [place], [place], [address] Phone: [phone]   Fax:  [phone]  Name: BLAKLEY LAVERDE MRN: [id number] Date of Birth: 05-19-1971

## 2019-08-05 ENCOUNTER — Other Ambulatory Visit: Payer: Self-pay

## 2019-08-05 ENCOUNTER — Ambulatory Visit: Payer: Medicare Other | Admitting: Physical Therapy

## 2019-08-05 ENCOUNTER — Encounter: Payer: Self-pay | Admitting: Physical Therapy

## 2019-08-05 DIAGNOSIS — M21372 Foot drop, left foot: Secondary | ICD-10-CM | POA: Diagnosis not present

## 2019-08-05 DIAGNOSIS — M6281 Muscle weakness (generalized): Secondary | ICD-10-CM

## 2019-08-05 DIAGNOSIS — R2689 Other abnormalities of gait and mobility: Secondary | ICD-10-CM

## 2019-08-05 DIAGNOSIS — R296 Repeated falls: Secondary | ICD-10-CM

## 2019-08-05 DIAGNOSIS — R262 Difficulty in walking, not elsewhere classified: Secondary | ICD-10-CM

## 2019-08-05 DIAGNOSIS — R2681 Unsteadiness on feet: Secondary | ICD-10-CM

## 2019-08-05 DIAGNOSIS — M21371 Foot drop, right foot: Secondary | ICD-10-CM | POA: Diagnosis not present

## 2019-08-05 NOTE — Therapy (Signed)
[facility] [address] [place], [place], [address] Phone: [phone]   Fax:  [phone]  Physical Therapy Treatment  Patient Details  Name: Johnathan Ross MRN: [id number] Date of Birth: Aug 31, 1970 Referring Provider (PT): Carylon Perches, MD   Encounter Date: 08/05/2019  PT End of Session - 08/05/19 1613    Visit Number  37    Number of Visits  77    Date for PT Re-Evaluation  10/02/19    Authorization Type  Medicare Part A and B; 10th visit PN    Progress Note Due on Visit  22    PT Start Time  0848    PT Stop Time  0932    PT Time Calculation (min)  44 min    Activity Tolerance  Patient tolerated treatment well   limited by spasticity   Behavior During Therapy  [facility] for tasks assessed/performed       Past Medical History:  Diagnosis Date  . Hyperlipidemia   . Hypertension   . Hypogonadism in male 11/01/2016  . Hypothyroidism   . Multiple sclerosis ([place])   . 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 Type 1 diabetes mellitus ([place]) dx'd 1994  . Vitamin D insufficiency 11/29/2016    Past Surgical History:  Procedure Laterality Date  . EYE SURGERY Bilateral    "laser for diabetic retinopathy"  . FRACTURE SURGERY    . OPEN REDUCTION INTERNAL FIXATION (ORIF) TIBIA/FIBULA FRACTURE Right 10/30/2016  . ORIF ANKLE FRACTURE Left 2015  . ORIF TIBIA FRACTURE Right 10/30/2016   Procedure: OPEN REDUCTION INTERNAL FIXATION (ORIF) TIBIA FIBULA FRACTURE;  Surgeon: Altamese [place], MD;  Location: [facility];  Service: Orthopedics;  Laterality: Right;  . RETINAL LASER PROCEDURE Bilateral    "for diabetic retinopathy"    There were no vitals filed for this visit.  Subjective Assessment - 08/05/19 1610    Subjective  Was outside most of the day yesterday; birthday tomorrow.  Got an appointment with neurologist next door later this month    Patient is accompained by:  Family member   parents in car   Pertinent History   multiple falls, vitamin D insufficiency, Type 1 IDDM, diabetic retinopathy, hypothyroidism, HTN, hyperlipidemia, R tibia fracture, L ankle fracture with ORIF    Limitations  Walking    Diagnostic tests  No new lesions seen on MRI    Patient Stated Goals  To improve walking    Currently in Pain?  No/denies         [facility] PT Assessment - 08/05/19 0925      Ambulation/Gait   Ambulation/Gait  Yes    Ambulation/Gait Assistance  5: Supervision    Ambulation Distance (Feet)  230 Feet    Assistive device  Rolling walker    Ambulation Surface  Level;Indoor    Stairs  Yes    Stairs Assistance  5: Supervision;4: Min assist    Stairs Assistance Details (indicate cue type and reason)  pt better able to flex RLE and advance to next step during step through stair sequence.  On 7th/8th step pt did experience increased R extensor tone requiring assistance from therapist to fully clear and advance RLE to next step; had to change to step to sequencing    Stair Management Technique  Two rails;Alternating pattern;Step to pattern;Forwards;Other (comment)   with AFO   Number of Stairs  8    Height of Stairs  6      Standardized Balance Assessment  Standardized Balance Assessment  10 meter walk test    10 Meter Walk  1:08 with RW and shoe covers; .48 ft/sec                    [facility] Adult PT Treatment/Exercise - 08/05/19 4656      Ambulation/Gait   Ambulation/Gait Assistance Details  with shoe covers pt able to ambulate ~40 feet before needing rest break due to LE hypertonicity.  Continues to require min facilitation from therapist for trunk stability, weight shifting anterior on RLE and closed chain ankle DF to translate tibia over R foot in stance phase.      Gait Pattern  Decreased hip/knee flexion - right;Decreased hip/knee flexion - left;Decreased dorsiflexion - right;Lateral trunk lean to left;Step-through pattern;Right genu recurvatum;Poor foot clearance - right;Trunk rotated posteriorly  on right      Knee/Hip Exercises: Stretches   Other Knee/Hip Stretches  Single knee to chest stretch with therapist providing over pressure x 2 minutes each LE      Knee/Hip Exercises: Supine   Knee Flexion  AAROM;Strengthening;Right;Left;2 sets;5 reps    Knee Flexion Limitations  AAROM after PROM at end range and then had pt perform strengthening through full range with cues for chin tuck, core activation prior to performing hip and knee flexion             PT Education - 08/05/19 1633    Education Details  progress towards STG    Person(s) Educated  Patient    Methods  Explanation    Comprehension  Verbalized understanding       PT Short Term Goals - 08/05/19 0901      PT SHORT TERM GOAL #1   Title  Patient will demonstrate independence with updated HEP    Time  4    Period  Weeks    Status  Revised    Target Date  08/03/19   target date extended due to pt on hold due to DVT in LUE     PT SHORT TERM GOAL #2   Title  Pt will improve gait velocity with RW to >/= 0.8 ft/sec with decreased use of UE to lift and swing RLE through    Baseline  .33 with RW, full distance without use of UE extension and swinging LE through    Time  4    Period  Weeks    Status  Not Met    Target Date  08/03/19      PT SHORT TERM GOAL #3   Title  Pt will improve BERG to >/= 30/56    Baseline  24/56    Time  4    Period  Weeks    Status  On-going    Target Date  08/03/19      PT SHORT TERM GOAL #4   Title  Pt will ambulate x 100' over indoor surfaces with RW and Bioness or AFO MOD I with 2 standing rest breaks and improved R hip and knee flexion and foot clearance without lifting through UE    Baseline  115' Supervision with RW and AFO when performing gait with hip and knee flexion without swinging LE through    Time  4    Period  Weeks    Status  Partially Met    Target Date  08/03/19      PT SHORT TERM GOAL #5   Title  Pt will negotate 8 stairs with two rails step to sequence  with supervision    Baseline  6 steps alternating sequence with 2 rails and supervision, final two steps required min A for alternating sequence    Time  4    Period  Weeks    Status  Partially Met    Target Date  08/03/19        PT Long Term Goals - 07/04/19 1225      PT LONG TERM GOAL #1   Title  Pt will demonstrate independence with land HEP    Time  12    Period  Weeks    Status  Revised    Target Date  10/02/19      PT LONG TERM GOAL #2   Title  Pt will improve gait velocity with RW to >/= 1.8 ft/sec    Time  12    Period  Weeks    Status  Revised    Target Date  10/02/19      PT LONG TERM GOAL #3   Title  Pt will improve BERG to >/= 38/56 to decrease falls risk    Time  12    Period  Weeks    Status  Revised    Target Date  10/02/19      PT LONG TERM GOAL #4   Title  Pt will ambulate x 200' outside over pavement and grass with supervision and LRAD to improve safety ambulating to wood working shop at home    Time  12    Period  Weeks    Status  Revised    Target Date  10/02/19      PT LONG TERM GOAL #5   Title  Pt will negotiate 8 stairs with 2 rails with alternating sequence with appropriate AFO/Bioness with supervision    Time  12    Period  Weeks    Status  Revised    Target Date  10/02/19            Plan - 08/05/19 1636    Clinical Impression Statement  Initiated assessment of STG with assessment of gait velocity, gait over level surfaces and stair negotiation.  Will assess BERG and HEP next session.  Pt is making slow progress but is demonstrating improved gait technique and sequencing with decreased use of UE extension and LE swing through.  Demonstrated improved ability to perform alternating sequence on stairs.  Continues to present with significant hip and knee flexion weakness, core weakness and increased LE extensor tone.  Will continue to address and progress towards LTG    Comorbidities  multiple falls, vitamin D insufficiency, Type 1 IDDM,  diabetic retinopathy, hypothyroidism, HTN, hyperlipidemia, R tibia fracture, L ankle fracture with ORIF    Rehab Potential  Fair    PT Frequency  2x / week    PT Duration  12 weeks    PT Treatment/Interventions  ADLs/Self Care Home Management;Aquatic Therapy;Electrical Stimulation;DME Instruction;Gait training;Stair training;Functional mobility training;Therapeutic activities;Therapeutic exercise;Balance training;Neuromuscular re-education;Patient/family education;Orthotic Fit/Training;Passive range of motion;Energy conservation    PT Next Visit Plan  CHECK BERG AND REVISE HEP.  TALK ABOUT LEATHER TOE CAPS?  Walk on treadmill or stand on ramp for incline and knee flexion?  Bioness on hold for now; Standing resisted hip/knee flexion, walking with resistance?  Focus on gentle activation of hip and knee flexion in supine/standing/gait to avoid overflow to extensors, gait at counter with cane.  Quadruped or seated core strengthening.  Continue to focus on core/postural control training without UE support in standing,  hip flexion and knee flexion strengthening, SLS and weight shift training (rockerboard). tall kneeling for proximal strengthening    Consulted and Agree with Plan of Care  Patient       Patient will benefit from skilled therapeutic intervention in order to improve the following deficits and impairments:  Abnormal gait, Decreased balance, Decreased endurance, Decreased coordination, Decreased range of motion, Decreased strength, Difficulty walking, Impaired tone, Postural dysfunction, Decreased activity tolerance, Decreased mobility  Visit Diagnosis: Other abnormalities of gait and mobility  Muscle weakness (generalized)  Unsteadiness on feet  Difficulty in walking, not elsewhere classified  Repeated falls  Foot drop, right  Foot drop, left     Problem List Patient Active Problem List   Diagnosis Date Noted  . Vitamin D insufficiency 11/29/2016  . Hypogonadism in male  11/01/2016  . Type 1 diabetes mellitus ([place])   . Hypothyroidism   . Hypertension   . Hyperlipidemia   . Closed fracture of right fibula and tibia 10/29/2016  . Closed tibia fracture 10/29/2016  . Closed fracture of right tibial plafond with fibula involvement 10/29/2016  . Abnormal liver function tests 03/06/2013  . RASH AND OTHER NONSPECIFIC SKIN ERUPTION 10/07/2009  . SHOULDER PAIN, LEFT 12/27/2008  . Type I (juvenile type) diabetes mellitus without mention of complication, uncontrolled 12/03/2007  . HYPOTHYROIDISM 11/12/2007  . HYPERCHOLESTEROLEMIA 11/12/2007  . MULTIPLE SCLEROSIS 08/29/2007  . PROLIFERATIVE DIABETIC RETINOPATHY 08/29/2007  . HYPERTENSION 12/25/2006    [name], PT, DPT 08/05/19    4:56 PM    [facility] [address] [place], [place], [address] Phone: [phone]   Fax:  [phone]  Name: TODD JELINSKI MRN: [id number] Date of Birth: 06-Mar-1971

## 2019-08-07 ENCOUNTER — Ambulatory Visit: Payer: Medicare Other | Admitting: Physical Therapy

## 2019-08-07 ENCOUNTER — Other Ambulatory Visit: Payer: Self-pay

## 2019-08-07 DIAGNOSIS — M21371 Foot drop, right foot: Secondary | ICD-10-CM | POA: Diagnosis not present

## 2019-08-07 DIAGNOSIS — M6281 Muscle weakness (generalized): Secondary | ICD-10-CM | POA: Diagnosis not present

## 2019-08-07 DIAGNOSIS — M21372 Foot drop, left foot: Secondary | ICD-10-CM

## 2019-08-07 DIAGNOSIS — R2689 Other abnormalities of gait and mobility: Secondary | ICD-10-CM

## 2019-08-07 DIAGNOSIS — R262 Difficulty in walking, not elsewhere classified: Secondary | ICD-10-CM

## 2019-08-07 DIAGNOSIS — R296 Repeated falls: Secondary | ICD-10-CM

## 2019-08-07 DIAGNOSIS — R2681 Unsteadiness on feet: Secondary | ICD-10-CM

## 2019-08-07 NOTE — Therapy (Signed)
[facility] [address] [place], [place], [address] Phone: [phone]   Fax:  [phone]  Physical Therapy Treatment  Patient Details  Name: Johnathan Ross MRN: [id number] Date of Birth: [birth year] Referring Provider (PT): Carylon Perches, MD   Encounter Date: 08/07/2019  PT End of Session - 08/07/19 9675    Visit Number  38    Number of Visits  56    Date for PT Re-Evaluation  10/02/19    Authorization Type  Medicare Part A and B; 10th visit PN    Progress Note Due on Visit  35    PT Start Time  0845    PT Stop Time  0935    PT Time Calculation (min)  50 min    Activity Tolerance  Patient tolerated treatment well   limited by spasticity   Behavior During Therapy  [facility] for tasks assessed/performed       Past Medical History:  Diagnosis Date  . Hyperlipidemia   . Hypertension   . Hypogonadism in male 11/01/2016  . Hypothyroidism   . Multiple sclerosis ([place])   . 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 Type 1 diabetes mellitus ([place]) dx'd 1994  . Vitamin D insufficiency 11/29/2016    Past Surgical History:  Procedure Laterality Date  . EYE SURGERY Bilateral    "laser for diabetic retinopathy"  . FRACTURE SURGERY    . OPEN REDUCTION INTERNAL FIXATION (ORIF) TIBIA/FIBULA FRACTURE Right 10/30/2016  . ORIF ANKLE FRACTURE Left 2015  . ORIF TIBIA FRACTURE Right 10/30/2016   Procedure: OPEN REDUCTION INTERNAL FIXATION (ORIF) TIBIA FIBULA FRACTURE;  Surgeon: Altamese [place], MD;  Location: [facility];  Service: Orthopedics;  Laterality: Right;  . RETINAL LASER PROCEDURE Bilateral    "for diabetic retinopathy"    There were no vitals filed for this visit.  Subjective Assessment - 08/07/19 0854    Subjective  Birthday yesterday.  No issues to report.    Patient is accompained by:  Family member   parents in car   Pertinent History  multiple falls, vitamin D insufficiency, Type 1 IDDM, diabetic retinopathy,  hypothyroidism, HTN, hyperlipidemia, R tibia fracture, L ankle fracture with ORIF    Limitations  Walking    Diagnostic tests  No new lesions seen on MRI    Patient Stated Goals  To improve walking    Currently in Pain?  No/denies                       [facility] Adult PT Treatment/Exercise - 08/07/19 0953      Ambulation/Gait   Ambulation/Gait  Yes    Ambulation/Gait Assistance  5: Supervision    Ambulation/Gait Assistance Details  cues and facilitation of initiation of R knee flexion at terminal stance; when pt fatigues he returns to lifting and swinging LE through. cues to stand and rest and perform static knee bends to decrease extensor tone prior to beginning to walk again.      Ambulation Distance (Feet)  115 Feet    Assistive device  Rolling walker    Gait Pattern  Decreased hip/knee flexion - right;Decreased hip/knee flexion - left;Decreased dorsiflexion - right;Lateral trunk lean to left;Step-through pattern;Right genu recurvatum;Poor foot clearance - right;Narrow base of support    Ambulation Surface  Level;Indoor      Therapeutic Activites    Therapeutic Activities  Other Therapeutic Activities    Other Therapeutic Activities  Continued to educate pt on use and benefits  of leather toe caps, showed pt example and demonstrated how leather toe cap prevents toe from catching on floor when there is a lack of hip/knee flexion for full clearance.  Pt concerned about the look of it; alerted pt that they may be able to do black leather to blend in with shoes.  Also discussed cost of shoe cap.  Pt is going to think about getting toe caps.      Knee/Hip Exercises: Standing   Hip Flexion  Stengthening;Right;Left;1 set;10 reps;Knee bent    Hip Flexion Limitations  red theraband resisted taps up to 2" block with UE support on RW for balance and therapist facilitating lateral weight shift, SLS and upright trunk      Knee/Hip Exercises: Seated   Sit to Sand  1 set;10 reps;with UE  support   elevated mat, red band around knees ABD     Knee/Hip Exercises: Sidelying   Hip ABduction  Strengthening;Right;2 sets;10 reps    Hip ABduction Limitations  attempted with LE straight; unable to perform due to weakness so changed to knee flexed to decrease lever arm.  Performed on each side 10 reps isometric hold x 5-6 seconds, open chain ABD x 10, open chain ABD with hip flexion x 10 reps.  Greater assistance required on R side to maintain pelvic position and prevent use of TFL.               PT Education - 08/07/19 1436    Education Details  reviewed recommendation for leather toe caps    Person(s) Educated  Patient    Methods  Explanation    Comprehension  Verbalized understanding       PT Short Term Goals - 08/05/19 0901      PT SHORT TERM GOAL #1   Title  Patient will demonstrate independence with updated HEP    Time  4    Period  Weeks    Status  Revised    Target Date  08/03/19   target date extended due to pt on hold due to DVT in LUE     PT SHORT TERM GOAL #2   Title  Pt will improve gait velocity with RW to >/= 0.8 ft/sec with decreased use of UE to lift and swing RLE through    Baseline  .86 with RW, full distance without use of UE extension and swinging LE through    Time  4    Period  Weeks    Status  Not Met    Target Date  08/03/19      PT SHORT TERM GOAL #3   Title  Pt will improve BERG to >/= 30/56    Baseline  24/56    Time  4    Period  Weeks    Status  On-going    Target Date  08/03/19      PT SHORT TERM GOAL #4   Title  Pt will ambulate x 100' over indoor surfaces with RW and Bioness or AFO MOD I with 2 standing rest breaks and improved R hip and knee flexion and foot clearance without lifting through UE    Baseline  115' Supervision with RW and AFO when performing gait with hip and knee flexion without swinging LE through    Time  4    Period  Weeks    Status  Partially Met    Target Date  08/03/19      PT SHORT TERM GOAL #5  Title  Pt will negotate 8 stairs with two rails step to sequence with supervision    Baseline  6 steps alternating sequence with 2 rails and supervision, final two steps required min A for alternating sequence    Time  4    Period  Weeks    Status  Partially Met    Target Date  08/03/19        PT Long Term Goals - 07/04/19 1225      PT LONG TERM GOAL #1   Title  Pt will demonstrate independence with land HEP    Time  12    Period  Weeks    Status  Revised    Target Date  10/02/19      PT LONG TERM GOAL #2   Title  Pt will improve gait velocity with RW to >/= 1.8 ft/sec    Time  12    Period  Weeks    Status  Revised    Target Date  10/02/19      PT LONG TERM GOAL #3   Title  Pt will improve BERG to >/= 38/56 to decrease falls risk    Time  12    Period  Weeks    Status  Revised    Target Date  10/02/19      PT LONG TERM GOAL #4   Title  Pt will ambulate x 200' outside over pavement and grass with supervision and LRAD to improve safety ambulating to wood working shop at home    Time  12    Period  Weeks    Status  Revised    Target Date  10/02/19      PT LONG TERM GOAL #5   Title  Pt will negotiate 8 stairs with 2 rails with alternating sequence with appropriate AFO/Bioness with supervision    Time  12    Period  Weeks    Status  Revised    Target Date  10/02/19            Plan - 08/07/19 1437    Clinical Impression Statement  Began to reassess LE strengthening exercises in order to provide pt with most updated HEP - attempted sidelying resisted open chain hip ABD but pt unable to activate glute med; able to perform with shortened lever arm and no resistance but fatigues quickly and requires assistance on R side.  Continued to incorporate hip ABD closed chain strengthening into exercises addressing hip flexor weakness and hip/knee extensor weakness.  Will continue to update HEP next session.    Comorbidities  multiple falls, vitamin D insufficiency, Type 1 IDDM,  diabetic retinopathy, hypothyroidism, HTN, hyperlipidemia, R tibia fracture, L ankle fracture with ORIF    Rehab Potential  Fair    PT Frequency  2x / week    PT Duration  12 weeks    PT Treatment/Interventions  ADLs/Self Care Home Management;Aquatic Therapy;Electrical Stimulation;DME Instruction;Gait training;Stair training;Functional mobility training;Therapeutic activities;Therapeutic exercise;Balance training;Neuromuscular re-education;Patient/family education;Orthotic Fit/Training;Passive range of motion;Energy conservation    PT Next Visit Plan  CHECK BERG AND REVISE HEP.  Really needs to work on hip ABD strengthening open and closed chain!  Bioness on hold for now; Standing resisted hip/knee flexion, walking with resistance?  Focus on gentle activation of hip and knee flexion in supine/standing/gait to avoid overflow to extensors, gait at counter with cane.  Quadruped or seated core strengthening.  Continue to focus on core/postural control training without UE support in standing, hip flexion  and knee flexion strengthening, SLS and weight shift training (rockerboard). tall kneeling for proximal strengthening    Consulted and Agree with Plan of Care  Patient       Patient will benefit from skilled therapeutic intervention in order to improve the following deficits and impairments:  Abnormal gait, Decreased balance, Decreased endurance, Decreased coordination, Decreased range of motion, Decreased strength, Difficulty walking, Impaired tone, Postural dysfunction, Decreased activity tolerance, Decreased mobility  Visit Diagnosis: Other abnormalities of gait and mobility  Muscle weakness (generalized)  Unsteadiness on feet  Difficulty in walking, not elsewhere classified  Repeated falls  Foot drop, right  Foot drop, left     Problem List Patient Active Problem List   Diagnosis Date Noted  . Vitamin D insufficiency 11/29/2016  . Hypogonadism in male 11/01/2016  . Type 1 diabetes  mellitus ([place])   . Hypothyroidism   . Hypertension   . Hyperlipidemia   . Closed fracture of right fibula and tibia 10/29/2016  . Closed tibia fracture 10/29/2016  . Closed fracture of right tibial plafond with fibula involvement 10/29/2016  . Abnormal liver function tests 03/06/2013  . RASH AND OTHER NONSPECIFIC SKIN ERUPTION 10/07/2009  . SHOULDER PAIN, LEFT 12/27/2008  . Type I (juvenile type) diabetes mellitus without mention of complication, uncontrolled 12/03/2007  . HYPOTHYROIDISM 11/12/2007  . HYPERCHOLESTEROLEMIA 11/12/2007  . MULTIPLE SCLEROSIS 08/29/2007  . PROLIFERATIVE DIABETIC RETINOPATHY 08/29/2007  . HYPERTENSION 12/25/2006   [name], PT, DPT 08/07/19    2:44 PM    [facility] [address] [place], [place], [address] Phone: [phone]   Fax:  [phone]  Name: GARELD OBRECHT MRN: [id number] Date of Birth: 1970-12-02

## 2019-08-11 ENCOUNTER — Other Ambulatory Visit: Payer: Self-pay

## 2019-08-11 ENCOUNTER — Other Ambulatory Visit (INDEPENDENT_AMBULATORY_CARE_PROVIDER_SITE_OTHER): Payer: Medicare Other

## 2019-08-11 DIAGNOSIS — E063 Autoimmune thyroiditis: Secondary | ICD-10-CM

## 2019-08-11 DIAGNOSIS — E1065 Type 1 diabetes mellitus with hyperglycemia: Secondary | ICD-10-CM | POA: Diagnosis not present

## 2019-08-11 DIAGNOSIS — E782 Mixed hyperlipidemia: Secondary | ICD-10-CM

## 2019-08-11 LAB — COMPREHENSIVE METABOLIC PANEL
ALT: 23 U/L (ref 0–53)
AST: 24 U/L (ref 0–37)
Albumin: 4.2 g/dL (ref 3.5–5.2)
Alkaline Phosphatase: 81 U/L (ref 39–117)
BUN: 20 mg/dL (ref 6–23)
CO2: 29 mEq/L (ref 19–32)
Calcium: 9.6 mg/dL (ref 8.4–10.5)
Chloride: 101 mEq/L (ref 96–112)
Creatinine, Ser: 1.11 mg/dL (ref 0.40–1.50)
GFR: 85.91 mL/min (ref >60.00)
Glucose, Bld: 202 mg/dL — ABNORMAL HIGH (ref 70–99)
Potassium: 4.6 mEq/L (ref 3.5–5.1)
Sodium: 136 mEq/L (ref 135–145)
Total Bilirubin: 0.5 mg/dL (ref 0.2–1.2)
Total Protein: 7 g/dL (ref 6.0–8.3)

## 2019-08-11 LAB — LIPID PANEL
Cholesterol: 134 mg/dL (ref 0–200)
HDL: 46 mg/dL (ref >39.00)
LDL Cholesterol: 81 mg/dL (ref 0–99)
NonHDL: 88.32
Total CHOL/HDL Ratio: 3
Triglycerides: 38 mg/dL (ref 0.0–149.0)
VLDL: 7.6 mg/dL (ref 0.0–40.0)

## 2019-08-11 LAB — T4, FREE: Free T4: 1.33 ng/dL (ref 0.60–1.60)

## 2019-08-11 LAB — TSH: TSH: 0.14 u[IU]/mL — ABNORMAL LOW (ref 0.35–4.50)

## 2019-08-11 LAB — HEMOGLOBIN A1C: Hgb A1c MFr Bld: 7.5 % — ABNORMAL HIGH (ref 4.6–6.5)

## 2019-08-13 ENCOUNTER — Other Ambulatory Visit: Payer: Self-pay

## 2019-08-13 ENCOUNTER — Encounter: Payer: Self-pay | Admitting: Physical Therapy

## 2019-08-13 ENCOUNTER — Ambulatory Visit: Payer: Medicare Other | Admitting: Physical Therapy

## 2019-08-13 DIAGNOSIS — M21372 Foot drop, left foot: Secondary | ICD-10-CM | POA: Diagnosis not present

## 2019-08-13 DIAGNOSIS — M21371 Foot drop, right foot: Secondary | ICD-10-CM | POA: Diagnosis not present

## 2019-08-13 DIAGNOSIS — R2689 Other abnormalities of gait and mobility: Secondary | ICD-10-CM | POA: Diagnosis not present

## 2019-08-13 DIAGNOSIS — R2681 Unsteadiness on feet: Secondary | ICD-10-CM | POA: Diagnosis not present

## 2019-08-13 DIAGNOSIS — M6281 Muscle weakness (generalized): Secondary | ICD-10-CM

## 2019-08-13 DIAGNOSIS — R296 Repeated falls: Secondary | ICD-10-CM

## 2019-08-13 DIAGNOSIS — R262 Difficulty in walking, not elsewhere classified: Secondary | ICD-10-CM

## 2019-08-13 NOTE — Therapy (Signed)
[facility] [address] [place], [place], [address] Phone: [phone]   Fax:  [phone]  Physical Therapy Treatment  Patient Details  Name: Johnathan Ross MRN: [id number] Date of Birth: [birth year] Referring Provider (PT): Carylon Perches, MD   Encounter Date: 08/13/2019  PT End of Session - 08/13/19 1156    Visit Number  39    Number of Visits  34    Date for PT Re-Evaluation  10/02/19    Authorization Type  Medicare Part A and B; 10th visit PN    Progress Note Due on Visit  33    PT Start Time  0848    PT Stop Time  0933    PT Time Calculation (min)  45 min    Activity Tolerance  Patient tolerated treatment well   limited by spasticity   Behavior During Therapy  [facility] for tasks assessed/performed       Past Medical History:  Diagnosis Date  . Hyperlipidemia   . Hypertension   . Hypogonadism in male 11/01/2016  . Hypothyroidism   . Multiple sclerosis ([place])   . 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 Type 1 diabetes mellitus ([place]) dx'd 1994  . Vitamin D insufficiency 11/29/2016    Past Surgical History:  Procedure Laterality Date  . EYE SURGERY Bilateral    "laser for diabetic retinopathy"  . FRACTURE SURGERY    . OPEN REDUCTION INTERNAL FIXATION (ORIF) TIBIA/FIBULA FRACTURE Right 10/30/2016  . ORIF ANKLE FRACTURE Left 2015  . ORIF TIBIA FRACTURE Right 10/30/2016   Procedure: OPEN REDUCTION INTERNAL FIXATION (ORIF) TIBIA FIBULA FRACTURE;  Surgeon: Altamese [place], MD;  Location: [facility];  Service: Orthopedics;  Laterality: Right;  . RETINAL LASER PROCEDURE Bilateral    "for diabetic retinopathy"    There were no vitals filed for this visit.  Subjective Assessment - 08/13/19 0851    Subjective  Has been having a good week, worked on the chops yesterday in tall kneeling.  Gait speed and LLE clearance improved today.  Is starting to have some pain in proximal LUE.    Patient is accompained by:   Family member   parents in car   Pertinent History  multiple falls, vitamin D insufficiency, Type 1 IDDM, diabetic retinopathy, hypothyroidism, HTN, hyperlipidemia, R tibia fracture, L ankle fracture with ORIF    Limitations  Walking    Diagnostic tests  No new lesions seen on MRI    Patient Stated Goals  To improve walking    Currently in Pain?  No/denies         Access Code: [email or web address] URL: [email or web address] Date: 08/13/2019 Prepared by: Misty Stanley  Exercises Child's Pose Stretch - 1 x daily - 7 x weekly - 3 sets - 20 second hold Knee tucks - 1 x daily - 7 x weekly - 5 reps - 4 sets Quadruped Hip Extension Kicks - 1 x daily - 5 x weekly - 5 reps - 4 sets Heel Sits - 1 x daily - 5 x weekly - 12 reps - 1 sets Tall Kneeling Diagonal Lift with Medicine Ball - 1 x daily - 5 x weekly - 5-10 reps - 1 sets Hooklying Isometric Hip Flexion - 1 x daily - 7 x weekly - 10 reps - 3 sets Sidelying Bent Knee Lift at 45 Degrees - 1 x daily - 7 x weekly - 3 sets - 5 reps    [facility] Adult PT Treatment/Exercise - 08/13/19 1148  Therapeutic Activites    Therapeutic Activities  Other Therapeutic Activities    Other Therapeutic Activities  Discussed pain in LUE - pt reports it does feel similar to the pain he had when he developed the DVT.  Pt denies redness or edema in LUE.  Pt does not want to contact physician right now because they will likely refer him back to vascular and pt did not feel he had a thorough assessment by vascular at the last visit; unclear if pt is still on blood thinner or not.  Will continue to monitor and strongly encouraged pt to alert physician if pain worsens or he begins to develop edema, redness or warmth in the UE.             PT Education - 08/13/19 1155    Education Details  recommendations to contact physician about LUE if symptoms worsen; updated HEP, updated visits    Person(s) Educated  Patient    Methods  Explanation;Demonstration;Handout     Comprehension  Verbalized understanding;Returned demonstration       PT Short Term Goals - 08/13/19 1204      PT SHORT TERM GOAL #1   Title  Patient will demonstrate independence with updated HEP    Baseline  updated on 3/18    Time  4    Period  Weeks    Status  Revised    Target Date  09/03/19   target date extended due to pt on hold due to DVT in LUE     PT SHORT TERM GOAL #2   Title  Pt will improve gait velocity with RW to >/= 0.6 ft/sec with decreased use of UE to lift and swing RLE through    Baseline  .65 with RW, full distance without use of UE extension and swinging LE through    Time  4    Period  Weeks    Status  Revised    Target Date  09/03/19      PT SHORT TERM GOAL #3   Title  Pt will improve BERG to >/= 30/56    Baseline  24/56    Time  4    Period  Weeks    Status  Revised    Target Date  09/03/19      PT SHORT TERM GOAL #4   Title  Pt will ambulate x 100' over indoor surfaces with RW and AFO MOD I with 2 standing rest breaks and improved R hip and knee flexion and foot clearance without lifting through UE    Baseline  115' Supervision with RW and AFO when performing gait with hip and knee flexion without swinging LE through    Time  4    Period  Weeks    Status  Revised    Target Date  09/03/19      PT SHORT TERM GOAL #5   Title  Pt will negotate 8 stairs with two rails step to sequence with supervision    Baseline  6 steps alternating sequence with 2 rails and supervision, final two steps required min A for alternating sequence    Time  4    Period  Weeks    Status  Revised    Target Date  09/03/19        PT Long Term Goals - 07/04/19 1225      PT LONG TERM GOAL #1   Title  Pt will demonstrate independence with land HEP    Time  12    Period  Weeks    Status  Revised    Target Date  10/02/19      PT LONG TERM GOAL #2   Title  Pt will improve gait velocity with RW to >/= 1.8 ft/sec    Time  12    Period  Weeks    Status  Revised     Target Date  10/02/19      PT LONG TERM GOAL #3   Title  Pt will improve BERG to >/= 38/56 to decrease falls risk    Time  12    Period  Weeks    Status  Revised    Target Date  10/02/19      PT LONG TERM GOAL #4   Title  Pt will ambulate x 200' outside over pavement and grass with supervision and LRAD to improve safety ambulating to wood working shop at home    Time  12    Period  Weeks    Status  Revised    Target Date  10/02/19      PT LONG TERM GOAL #5   Title  Pt will negotiate 8 stairs with 2 rails with alternating sequence with appropriate AFO/Bioness with supervision    Time  12    Period  Weeks    Status  Revised    Target Date  10/02/19            Plan - 08/13/19 1157    Clinical Impression Statement  Pt demonstrating improved gait sequencing today with improved LLE clearance and increased gait velocity.  Reviewed and revised patient's HEP to continue to focus primarily on hip and knee flexion strength, core and hip ABD strength for increased proximal stability.  Pt is beginning to demonstrate return of muscle strength and activation and improved muscular endurance.  Will continue to address and progress towards LTG.    Comorbidities  multiple falls, vitamin D insufficiency, Type 1 IDDM, diabetic retinopathy, hypothyroidism, HTN, hyperlipidemia, R tibia fracture, L ankle fracture with ORIF    Rehab Potential  Fair    PT Frequency  2x / week    PT Duration  12 weeks    PT Treatment/Interventions  ADLs/Self Care Home Management;Aquatic Therapy;Electrical Stimulation;DME Instruction;Gait training;Stair training;Functional mobility training;Therapeutic activities;Therapeutic exercise;Balance training;Neuromuscular re-education;Patient/family education;Orthotic Fit/Training;Passive range of motion;Energy conservation    PT Next Visit Plan  CHECK BERG and send to me for 10th visit PN.  How is his LUE - pain any worse? any new edema?  Really needs to work on hip ABD  strengthening open and closed chain!  Bioness on hold for now; Standing resisted hip/knee flexion, walking with resistance?  Focus on gentle activation of hip and knee flexion in supine/standing/gait to avoid overflow to extensors, gait at counter with cane.  Quadruped or seated core strengthening.  Continue to focus on core/postural control training without UE support in standing, hip flexion and knee flexion strengthening, SLS and weight shift training (rockerboard). tall kneeling for proximal strengthening    PT Home Exercise Plan  Revised HEP on 3/18    Recommended Other Services  If LUE pain worsens with edema, redness and warmth will refer back to physician to check for new DVT    Consulted and Agree with Plan of Care  Patient       Patient will benefit from skilled therapeutic intervention in order to improve the following deficits and impairments:  Abnormal gait, Decreased balance, Decreased endurance, Decreased coordination, Decreased  range of motion, Decreased strength, Difficulty walking, Impaired tone, Postural dysfunction, Decreased activity tolerance, Decreased mobility  Visit Diagnosis: Other abnormalities of gait and mobility  Muscle weakness (generalized)  Unsteadiness on feet  Difficulty in walking, not elsewhere classified  Repeated falls  Foot drop, right  Foot drop, left     Problem List Patient Active Problem List   Diagnosis Date Noted  . Vitamin D insufficiency 11/29/2016  . Hypogonadism in male 11/01/2016  . Type 1 diabetes mellitus ([place])   . Hypothyroidism   . Hypertension   . Hyperlipidemia   . Closed fracture of right fibula and tibia 10/29/2016  . Closed tibia fracture 10/29/2016  . Closed fracture of right tibial plafond with fibula involvement 10/29/2016  . Abnormal liver function tests 03/06/2013  . RASH AND OTHER NONSPECIFIC SKIN ERUPTION 10/07/2009  . SHOULDER PAIN, LEFT 12/27/2008  . Type I (juvenile type) diabetes mellitus without mention  of complication, uncontrolled 12/03/2007  . HYPOTHYROIDISM 11/12/2007  . HYPERCHOLESTEROLEMIA 11/12/2007  . MULTIPLE SCLEROSIS 08/29/2007  . PROLIFERATIVE DIABETIC RETINOPATHY 08/29/2007  . HYPERTENSION 12/25/2006    [name], PT, DPT 08/13/19    12:08 PM    [facility] [address] [place], [place], [address] Phone: [phone]   Fax:  [phone]  Name: Johnathan Ross MRN: [id number] Date of Birth: 1971-01-05

## 2019-08-13 NOTE — Patient Instructions (Addendum)
Access Code: [email or web address] URL: [email or web address] Date: 08/13/2019 Prepared by: Misty Stanley  Exercises Child's Pose Stretch - 1 x daily - 7 x weekly - 3 sets - 20 second hold Knee tucks - 1 x daily - 7 x weekly - 5 reps - 4 sets Quadruped Hip Extension Kicks - 1 x daily - 5 x weekly - 5 reps - 4 sets Heel Sits - 1 x daily - 5 x weekly - 12 reps - 1 sets Tall Kneeling Diagonal Lift with Medicine Ball - 1 x daily - 5 x weekly - 5-10 reps - 1 sets Hooklying Isometric Hip Flexion - 1 x daily - 7 x weekly - 10 reps - 3 sets Sidelying Bent Knee Lift at 45 Degrees - 1 x daily - 7 x weekly - 3 sets - 5 reps

## 2019-08-19 ENCOUNTER — Ambulatory Visit (INDEPENDENT_AMBULATORY_CARE_PROVIDER_SITE_OTHER): Payer: Medicare Other | Admitting: Neurology

## 2019-08-19 ENCOUNTER — Encounter: Payer: Self-pay | Admitting: Neurology

## 2019-08-19 ENCOUNTER — Other Ambulatory Visit: Payer: Self-pay

## 2019-08-19 ENCOUNTER — Ambulatory Visit (INDEPENDENT_AMBULATORY_CARE_PROVIDER_SITE_OTHER): Payer: Medicare Other | Admitting: Endocrinology

## 2019-08-19 ENCOUNTER — Encounter: Payer: Self-pay | Admitting: Endocrinology

## 2019-08-19 VITALS — BP 130/78 | HR 101 | Temp 97.6°F | Ht 73.0 in | Wt 197.4 lb

## 2019-08-19 VITALS — Ht 73.0 in

## 2019-08-19 DIAGNOSIS — E1065 Type 1 diabetes mellitus with hyperglycemia: Secondary | ICD-10-CM | POA: Diagnosis not present

## 2019-08-19 DIAGNOSIS — E063 Autoimmune thyroiditis: Secondary | ICD-10-CM | POA: Diagnosis not present

## 2019-08-19 DIAGNOSIS — G830 Diplegia of upper limbs: Secondary | ICD-10-CM

## 2019-08-19 DIAGNOSIS — R269 Unspecified abnormalities of gait and mobility: Secondary | ICD-10-CM | POA: Diagnosis not present

## 2019-08-19 DIAGNOSIS — G35 Multiple sclerosis: Secondary | ICD-10-CM | POA: Diagnosis not present

## 2019-08-19 DIAGNOSIS — I82629 Acute embolism and thrombosis of deep veins of unspecified upper extremity: Secondary | ICD-10-CM | POA: Diagnosis not present

## 2019-08-19 DIAGNOSIS — E1042 Type 1 diabetes mellitus with diabetic polyneuropathy: Secondary | ICD-10-CM | POA: Diagnosis not present

## 2019-08-19 LAB — TSH: TSH: 0.68 (ref <=5.90)

## 2019-08-19 MED ORDER — BACLOFEN 10 MG PO TABS: ORAL_TABLET | ORAL | 11 refills | Status: DC

## 2019-08-19 NOTE — Progress Notes (Signed)
Subjective:              Patient ID: Johnathan Ross, male   DOB: 1970/10/05, 49 y.o.   MRN: [id number]  I connected with the above-named patient by video enabled telemedicine application and verified that I am speaking with the correct person. The patient was explained the limitations of evaluation and management by telemedicine and the availability of in person appointments.  Patient also understood that there may be a patient responsible charge related to this service . Location of the patient: Patient's home . Location of the provider: Physician office Only the patient and myself were participating in the encounter The patient understood the above statements and agreed to proceed.    Diabetes   Diagnosis: Type 1 diabetes mellitus, date of diagnosis:  1995.   Insulin Pump followup:   CURRENT brand:  Medtronic  630 G   HISTORY: An insulin pump has been in use since 03/13/11.   PUMP settings are basal rate:  12 AM-2 AM = 0.55, 2 AM-6 AM = 0.75,, 6 a.m.= 1.75, 7 AM = 1. 5 5,. 9 AM = 1. 85, 6 PM = 1.7 9 PM = 0.5, 10 PM = 1.7  Carbohydrate to insulin ratio: 8 AM = 1: 8, at 11 a.m. = 1: 5, after 6 p.m. = 1:7, hyperglycemia correction factor: 1: 35,and after 10 p.m. 1:30, target 120, active insulin time 4 hours.   Recent history:  His A1c has usually been over 8% and now 7.6   Current blood sugar patterns Evaluated from freestyle Libre and pump download, management and problems identified:  Current management, blood sugar patterns and problems identified     He appears to have increased his basal rates between midnight and 6 AM since his last visit but this is causing periodic hypoglycemia overnight.  As before he will suspend his pump through the night when his blood sugars are getting lower  Sometimes this will cause significant rebound hypoglycemia in the morning  He is also stating that his blood sugars are going up in the mornings on waking up even without breakfast   However appears to be frequently not bolusing for his meals especially breakfast  May be bolusing only when the blood sugar is going up  Most of his blood sugar spikes are related to probably inadequate or late boluses  Also usually has excessive rebound from low normal or low sugars  A few times appears to have progressive decrease in blood sugar after correction doses causing mild hypoglycemia  He thinks his freestyle Elenor Legato is accurate    CONTINUOUS GLUCOSE MONITORING RECORD INTERPRETATION    Dates of Recording: 07/3121/16  Sensor description: Elenor Legato  Results statistics:   CGM use % of time  97  Average and SD  152, GV 44  Time in range     65   %  % Time Above 180  19  % Time above 250 9  % Time Below target  7    PRE-MEAL Fasting Lunch Dinner  2-4 AM Overall  Glucose range:       Mean/median:  142  132  167  112    POST-MEAL PC Breakfast PC Lunch PC Dinner  Glucose range:     Mean/median:  179  158  183    Glycemic patterns summary: Blood sugars are generally fluctuating significantly except middle of the night Overall highest blood sugars are midmorning and frequently in the evenings Occasionally has tendency to low sugars  between 2-6 AM  Hyperglycemic episodes Occurring randomly at various times, especially midmorning and afternoons and sometimes in the evenings most significantly rebounding from low normal or low sugars with blood sugars frequently going up to about 350  Hypoglycemic episodes occurred most frequently around early part of the night along with low normal sugars mid afternoon or evenings   Overnight periods: Blood sugars are somewhat variable but usually blood sugars are decreasing gradually to the lowest levels around 3-5 AM with some days of hypoglycemia  Preprandial periods: Blood sugars are generally rising in the mornings, averaging about 130-140 at lunchtime and mostly averaging about 160-170 at dinnertime  Postprandial periods:    After breakfast: Blood sugars show inconsistent rise on some days After lunch: He has periodic blood sugar spikes especially when not bolusing  After dinner: Inconsistent pattern with some days blood sugars are going up significantly  Previous data:  PRE-MEAL Fasting Lunch Dinner  overnight Overall  Glucose range:  161-233  163-234  58-293  177-240   Mean/median:  228  172  184   204   POST-MEAL PC Breakfast PC Lunch PC Dinner  Glucose range:    199-331  Mean/median:        Wt Readings from Last 3 Encounters:  05/21/19 187 lb (84.8 kg)  04/06/19 192 lb 4.8 oz (87.2 kg)  02/21/19 189 lb (85.7 kg)     Lab Results  Component Value Date   HGBA1C 7.5 (H) 08/11/2019   HGBA1C 7.6 (H) 05/14/2019   HGBA1C 8.0 (H) 11/27/2018   Lab Results  Component Value Date   MICROALBUR 1.1 12/23/2017   [place] 81 08/11/2019   CREATININE 1.11 08/11/2019     OTHER active problems discussed today: See review of systems    Allergies as of 08/19/2019   No Known Allergies     Medication List       Accurate as of August 19, 2019  8:18 AM. If you have any questions, ask your nurse or doctor.        Ampyra 10 MG Tb12 Generic drug: dalfampridine Take 10 mg by mouth See admin instructions. Take 10 mg by mouth in the morning and 10 mg mid-afternoon   Contour Next Test test strip Generic drug: glucose blood USE AS INSTRUCTED TO CHECK BLOOD SUGARS 8 TIMES DAILY   insulin lispro 100 UNIT/ML injection Commonly known as: HUMALOG Use max of 80 units daily via insulin pump. **replaces Novolog since Psychiatric nurse cover.** What changed:   when to take this  additional instructions   lisinopril 10 MG tablet Commonly known as: ZESTRIL TAKE 1 TABLET BY MOUTH EVERY DAY   rivaroxaban 20 MG Tabs tablet Commonly known as: Xarelto Take 1 tablet (20 mg total) by mouth daily with supper.   simvastatin 20 MG tablet Commonly known as: ZOCOR TAKE 1 TABLET BY MOUTH AT BEDTIME   Synthroid 125  MCG tablet Generic drug: levothyroxine TAKE 1 TABLET (125 MCG TOTAL) BY MOUTH DAILY BEFORE BREAKFAST.   tiZANidine 4 MG capsule Commonly known as: ZANAFLEX Take 4 mg by mouth 3 (three) times daily.       Allergies: No Known Allergies  Past Medical History:  Diagnosis Date  . Hyperlipidemia   . Hypertension   . Hypogonadism in male 11/01/2016  . Hypothyroidism   . Multiple sclerosis ([place])   . 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 Type 1 diabetes mellitus ([place]) dx'd 1994  . Vitamin D insufficiency 11/29/2016    Past Surgical History:  Procedure Laterality Date  . EYE SURGERY Bilateral    "laser for diabetic retinopathy"  . FRACTURE SURGERY    . OPEN REDUCTION INTERNAL FIXATION (ORIF) TIBIA/FIBULA FRACTURE Right 10/30/2016  . ORIF ANKLE FRACTURE Left 2015  . ORIF TIBIA FRACTURE Right 10/30/2016   Procedure: OPEN REDUCTION INTERNAL FIXATION (ORIF) TIBIA FIBULA FRACTURE;  Surgeon: Altamese Hazel, MD;  Location: [place];  Service: Orthopedics;  Laterality: Right;  . RETINAL LASER PROCEDURE Bilateral    "for diabetic retinopathy"    Family History  Problem Relation Age of Onset  . Diabetes Sister   . Colon cancer Maternal Grandmother   . Colon polyps Maternal Uncle     Social History:  reports that he has never smoked. He has never used smokeless tobacco. He reports that he does not drink alcohol or use drugs.    Review of Systems      HYPERCHOLESTEROLEMIA: He has been taking simvastatin 20 mg daily  LDL is below 100 consistently  Has no increase in ALT  Lab Results  Component Value Date   CHOL 134 08/11/2019   HDL 46.00 08/11/2019   LDLCALC 81 08/11/2019   LDLDIRECT 139.7 04/09/2014   TRIG 38.0 08/11/2019   CHOLHDL 3 08/11/2019   Lab Results  Component Value Date   ALT 23 08/11/2019    HYPERTENSION:  has  been on lisinopril with good control  of blood pressure    BP Readings from Last 3 Encounters:  05/21/19 124/72  04/06/19 112/63  02/25/19  126/76    He has MULTIPLE sclerosis, is followed in [place] by neurologist and still not on treatment  HYPOTHYROIDISM: He has had longstanding primary 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 autoimmune  His thyroid dosage has changed frequently and has had to change his regimen with every visit  His TSH has been generally variable and not clear why it fluctuates He also does not feel any different whether his thyroid level is high or low More recently has required lower doses progressively  His dose has been reduced to 125 mcg, 6-1/2 days/week because of persistently low TSH level He is able to get brand-name Synthroid and is using 90-day prescriptions  Subjectively doing well He is regular with taking his supplement before breakfast daily TSH is again below normal with normal free T4   Lab Results  Component Value Date   TSH 0.14 (L) 08/11/2019   TSH 0.21 (L) 05/14/2019   TSH 0.01 (L) 11/27/2018   FREET4 1.33 08/11/2019   FREET4 1.41 05/14/2019   FREET4 2.24 (H) 11/27/2018   He has not been able to sign up for the Covid vaccine     Objective:   Physical Exam  Ht 6\' 1"  (1.854 m)   BMI 24.67 kg/m            Assessment:      DIABETES type I on insulin pump:  See history of present illness for detailed discussion of current diabetes management, blood sugar patterns and problems identified  He has an A1c of about 7.5 again  Now able to review his blood sugar data from his freestyle libre which he believes is accurate  Have discussed in detail about his basal rate appears to be excessive on some nights causing hypoglycemia and even sometimes with suspending his pump blood sugars may not go up significantly a few hours later However he is not bolusing consistently at meals from his blood sugar pattern analysis as his blood sugars seem to spike up especially when blood sugars  are low normal Also likely overtreating low sugars Sometimes low sugars during the day may be related to  overcorrection from his current sensitivity of 1: 35 which may also be playing a role with low sugars with correction doses late at night   HYPOTHYROIDISM:  TSH is persistently low Although he has inconsistent requirement for levothyroxine he appears to be needing gradually decreasing doses now in the last few months  Since his TSH is persistently low will need to reduce the dose to 100 mcg from his next prescription In the meantime he will skip 1 tablet weekly  Lipids: Has been well controlled with simvastatin 20 mg  HYPERTENSION: Needs follow-up in the office     Plan:     BASAL rate changes: Midnight = 0.50, 2 AM = 0.65, 6 AM = 1.9 and 7 AM = 1.7   He will try to get the T-insulin pump if this is available as an upgrade   HYPOTHYROIDISM: He will reduce the dose by another 1/2 tablet weekly, for now continue Synthroid brand name 125 mcg prescription.  See above  Other recommendations as above  Follow-up another 2 months  My chart information sent on where to get the Covid vaccine appointment    There are no Patient Instructions on file for this visit.     Elayne Snare  Note: This office note was prepared with Estate agent. Any transcriptional errors that result from this process are unintentional.

## 2019-08-19 NOTE — Progress Notes (Signed)
[facility]  PATIENT: Johnathan Ross DOB: [birth year]  REFERRING DOCTOR OR PCP: Jodi Mourning, FNP SOURCE: Patient, notes from Dr. Bjorn Loser (neurology), multiple MRI reports and images reviewed, lab reports.  _________________________________   HISTORICAL  CHIEF COMPLAINT:  Chief Complaint  Patient presents with  . New Patient (Initial Visit)    RM 29 with sister, [name] (temp 98.1). Internal referral for MS. Ambulates with walker. Received botox on legs in November. Going to rehab next door for the last 2 yr. Currently under care for MS with Dr Doyle Askew in [place], [place]. He wanted another opinion for treatment of his MS.      HISTORY OF PRESENT ILLNESS:  I had the pleasure seeing your patient, Johnathan Ross, at the [place] [facility] for neurologic consultation regarding his multiple sclerosis.  He is a 49 year old man who was diagnosed with relapsing remitting MS who was diagnosed with MS around 2007.   He presented with spasms in his left leg.   He was diagnosed with MS and then had the diagnosis confirmed by Dr. Bjorn Loser in [place].   He started Rebif and had difficulties tolerating it.    He went on [place] in 2016 and had his second year in 2017.        He has had difficulties with gait and spasticity.   Both legs are weak.   His right leg was worse for the longest but more recently, the left leg is nearly as weak.   Both legs spasm up.    More recently, in November 2020, he had Botox for spasticity in both legs.   He has felt weaker and more spastic since the Botox and does not feel it has worn off.   He is now needing to use the walker only but was using a cane some.  Spasticity is worse when he sits a while and then has to stretch after he stands up.    He uses the walker to walk mostly by lifting with his arms.    He was on baclofen for spasticity in the past ans more recently on tizanidine.    Gait has been his biggest problem  over the last couple years along with spasticity.  Currently both legs are weak fairly symmetrically.  He notes increased muscle tone.  He continues to have spasms when he changes positions.  He uses a standard walker and relies on his arms a lot to swing his legs.  He notes mild numbness in his feet.  There is no dysesthetic pain in the feet.  He notes some urinary urgency.  He denies difficulties with his vision.  He reports some fatigue.  He sleeps well some nights but other nights wakes up.  He denies much difficulties with cognition.  He denies mood disturbance.  I personally reviewed the MRIs of the brain and cervical spine from 07/27/2019.  The MRI of the spine shows several T2 hyperintense foci, located posterior laterally bilaterally, right larger than left adjacent to C2-C3, laterally to the left at C3-C4, centrally at C4-C5 also associated with spinal stenosis.  The MRI of the brain shows multiple T2/FLAIR hyperintense foci in the periventricular and juxtacortical white matter and a couple punctate foci in the cerebellum.  None of the foci appear to be acute.  He has type 1 diabetes mellitus and is on insulin.  He also has hypothyroidism and is on Synthroid.  He has well-controlled hypertension.  REVIEW OF SYSTEMS: Constitutional: No  fevers, chills, sweats, or change in appetite.  He notes some fatigue. Eyes: No visual changes, double vision, eye pain.  He has had laser eye surgery Ear, nose and throat: No hearing loss, ear pain, nasal congestion, sore throat Cardiovascular: No chest pain, palpitations Respiratory: No shortness of breath at rest or with exertion.   No wheezes GastrointestinaI: No nausea, vomiting, diarrhea, abdominal pain, fecal incontinence Genitourinary: No dysuria, urinary retention or frequency.  No nocturia. Musculoskeletal: No neck pain, back pain Integumentary: No rash, pruritus, skin lesions Neurological: as above Psychiatric: No depression at this time.  No  anxiety Endocrine: No palpitations, diaphoresis, change in appetite, change in weigh or increased thirst Hematologic/Lymphatic: No anemia, purpura, petechiae. Allergic/Immunologic: No itchy/runny eyes, nasal congestion, recent allergic reactions, rashes  ALLERGIES: No Known Allergies  HOME MEDICATIONS:  Current Outpatient Medications:  .  dalfampridine (AMPYRA) 10 MG TB12, Take 10 mg by mouth See admin instructions. Take 10 mg by mouth in the morning and 10 mg mid-afternoon, Disp: , Rfl:  .  glucose blood (CONTOUR NEXT TEST) test strip, USE AS INSTRUCTED TO CHECK BLOOD SUGARS 8 TIMES DAILY, Disp: 300 strip, Rfl: 3 .  insulin lispro (HUMALOG) 100 UNIT/ML injection, Use max of 80 units daily via insulin pump. **replaces Novolog since Psychiatric nurse cover.** (Patient taking differently: See admin instructions. Use max of 80 units daily via insulin pump. **replaces Novolog since insurance won't cover.**), Disp: 30 mL, Rfl: 3 .  lisinopril (ZESTRIL) 10 MG tablet, TAKE 1 TABLET BY MOUTH EVERY DAY, Disp: 90 tablet, Rfl: 0 .  simvastatin (ZOCOR) 20 MG tablet, TAKE 1 TABLET BY MOUTH AT BEDTIME, Disp: 90 tablet, Rfl: 0 .  SYNTHROID 125 MCG tablet, TAKE 1 TABLET (125 MCG TOTAL) BY MOUTH DAILY BEFORE BREAKFAST., Disp: 90 tablet, Rfl: 1  PAST MEDICAL HISTORY: Past Medical History:  Diagnosis Date  . Hyperlipidemia   . Hypertension   . Hypogonadism in male 11/01/2016  . Hypothyroidism   . Multiple sclerosis ([place])   . 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 Type 1 diabetes mellitus ([place]) dx'd 1994  . Vitamin D insufficiency 11/29/2016    PAST SURGICAL HISTORY: Past Surgical History:  Procedure Laterality Date  . EYE SURGERY Bilateral    "laser for diabetic retinopathy"  . FRACTURE SURGERY    . OPEN REDUCTION INTERNAL FIXATION (ORIF) TIBIA/FIBULA FRACTURE Right 10/30/2016  . ORIF ANKLE FRACTURE Left 2015  . ORIF TIBIA FRACTURE Right 10/30/2016   Procedure: OPEN REDUCTION INTERNAL FIXATION  (ORIF) TIBIA FIBULA FRACTURE;  Surgeon: Altamese Ellenboro, MD;  Location: [place];  Service: Orthopedics;  Laterality: Right;  . RETINAL LASER PROCEDURE Bilateral    "for diabetic retinopathy"    FAMILY HISTORY: Family History  Problem Relation Age of Onset  . Diabetes Sister   . Colon cancer Maternal Grandmother   . Colon polyps Maternal Uncle     SOCIAL HISTORY:  Social History   Socioeconomic History  . Marital status: Single    Spouse name: 12  . Number of children: 1  . Years of education: Not on file  . Highest education level: Not on file  Occupational History  . Occupation: unemployed  Tobacco Use  . Smoking status: Never Smoker  . Smokeless tobacco: Never Used  Substance and Sexual Activity  . Alcohol use: No  . Drug use: No  . Sexual activity: Not on file  Other Topics Concern  . Not on file  Social History Narrative   Right handed   No caffeine  Lives with girlfriend   Social Determinants of Health   Financial Resource Strain:   . Difficulty of Paying Living Expenses:   Food Insecurity:   . Worried About Charity fundraiser in the Last Year:   . Arboriculturist in the Last Year:   Transportation Needs:   . Film/video editor (Medical):   Marland Kitchen Lack of Transportation (Non-Medical):   Physical Activity:   . Days of Exercise per Week:   . Minutes of Exercise per Session:   Stress:   . Feeling of Stress :   Social Connections:   . Frequency of Communication with Friends and Family:   . Frequency of Social Gatherings with Friends and Family:   . Attends Religious Services:   . Active Member of Clubs or Organizations:   . Attends Archivist Meetings:   Marland Kitchen Marital Status:   Intimate Partner Violence:   . Fear of Current or Ex-Partner:   . Emotionally Abused:   Marland Kitchen Physically Abused:   . Sexually Abused:      PHYSICAL EXAM  Vitals:   08/19/19 1330  BP: 130/78  Pulse: (!) 101  Temp: 97.6 F (36.4 C)  SpO2: 98%  Weight: 197 lb 6.4 oz  (89.5 kg)  Height: 6\' 1"  (1.854 m)    Body mass index is 26.04 kg/m.   Hearing Screening   125Hz  250Hz  500Hz  1000Hz  2000Hz  3000Hz  4000Hz  6000Hz  8000Hz   Right ear:           Left ear:             Visual Acuity Screening   Right eye Left eye Both eyes  Without correction: 20/40 20/40 20/30   With correction:       General: The patient is well-developed and well-nourished and in no acute distress  HEENT:  Head is /AT.  Sclera are anicteric.  Funduscopic exam shows normal optic discs and retinal vessels.  Neck: No carotid bruits are noted.  The neck is nontender.  Cardiovascular: The heart has a regular rate and rhythm with a normal S1 and S2. There were no murmurs, gallops or rubs.    Skin: Extremities are without rash or  edema.  Musculoskeletal:  Back is nontender  Neurologic Exam  Mental status: The patient is alert and oriented x 3 at the time of the examination. The patient has apparent normal recent and remote memory, with an apparently normal attention span and concentration ability.   Speech is normal.  Cranial nerves: Extraocular movements are full. Pupils are equal, round, and reactive to light and accomodation..,  Color vision was symmetric.  Facial symmetry is present. There is good facial sensation to soft touch bilaterally.Facial strength is normal.  Trapezius and sternocleidomastoid strength is normal. No dysarthria is noted.  The tongue is midline, and the patient has symmetric elevation of the soft palate. No obvious hearing deficits are noted.  Motor:  Muscle bulk is normal.   Muscle tone is increased in the legs.  Strength is 5/5 in the arms, 4 - in the hip flexors, 4++ in the leg extenders, 4 in the gastrocnemius muscles and 4 - with ankle extensors  Sensory: Sensory testing is intact to pinprick, soft touch and vibration sensation in the arms but he has reduced vibration sensation and reduced touch sensation in the feet vibration sensation was just mildly  reduced at the ankle.  Coordination: Cerebellar testing reveals good finger-nose-finger and heel-to-shin bilaterally.  Gait and station: He can rise up  from a chair using his arms to support.  He uses a walker but relies on his arm strength and then swings his legs as he walks.  Reflexes: Deep tendon reflexes are normal in the arms and knees and absent at the toes.   Plantar responses are flexor.    DIAGNOSTIC DATA (LABS, IMAGING, TESTING) - I reviewed patient records, labs, notes, testing and imaging myself where available.  Lab Results  Component Value Date   WBC 7.3 07/28/2019   HGB 15.6 07/28/2019   HCT 48 07/28/2019   MCV 86.8 02/21/2019   PLT 278 02/21/2019      Component Value Date/Time   NA 136 08/11/2019 0944   K 4.6 08/11/2019 0944   CL 101 08/11/2019 0944   CO2 29 08/11/2019 0944   GLUCOSE 202 (H) 08/11/2019 0944   GLUCOSE 147 (H) 05/24/2006 1022   BUN 20 08/11/2019 0944   CREATININE 1.11 08/11/2019 0944   CALCIUM 9.6 08/11/2019 0944   CALCIUM 8.7 10/31/2016 0415   PROT 7.0 08/11/2019 0944   ALBUMIN 4.2 08/11/2019 0944   AST 24 08/11/2019 0944   ALT 23 08/11/2019 0944   ALKPHOS 81 08/11/2019 0944   BILITOT 0.5 08/11/2019 0944   GFRNONAA 75 07/28/2019 0000   GFRAA 87 07/28/2019 0000   Lab Results  Component Value Date   CHOL 134 08/11/2019   HDL 46.00 08/11/2019   LDLCALC 81 08/11/2019   LDLDIRECT 139.7 04/09/2014   TRIG 38.0 08/11/2019   CHOLHDL 3 08/11/2019   Lab Results  Component Value Date   HGBA1C 7.5 (H) 08/11/2019   No results found for: VITAMINB12 Lab Results  Component Value Date   TSH 0.14 (L) 08/11/2019       ASSESSMENT AND PLAN  Multiple sclerosis (HCC)  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HCC)  Gait disturbance  Acquired diplegia ([place])   In summary, Mr. Saadeh is a 49 year old man with a 14-year history of multiple sclerosis who has had more difficulties with gait and spasticity over the past few  months.  He has not had any exacerbations since starting [place] and has just completed his rems monitoring.  Unfortunately, he has had some continued progression with more difficulties in the legs.  Gait issues worsen after a trial of Botox for the leg spasticity.  Very long discussion about his walking and about active secondary progressive MS.  Unfortunately, even if exacerbations are well controlled there can continue to be slow worsening in strength and spasticity.  Many years ago he was on baclofen but he does not recall whether it was ineffective or if he had trouble tolerating it or if he stopped it for another reason.  I will have him in initiate baclofen 5 mg twice a day and titrate up to 30 mg a day (10 mg 3 times daily).  This dose can be further increased as needed.  If he gets a benefit though has sleepiness, consider evaluation for baclofen pump.  He also has mild diabetic polyneuropathy and we discussed the need to continue to keep his diabetes under the best control possible.  He will stay active and exercise as tolerated.  He will return to see me in 4 months or sooner for new or worsening neurologic symptoms.  Thank you for asking me to see Mr. Deltoro.  Please let me know if I can be of further assistance with him or other patients in the future.   Kaedyn Belardo A. Felecia Shelling, MD, [facility] 08/19/2019, 1:40 PM  Certified in Neurology, [place] Neurophysiology, Sleep Medicine and Neuroimaging  [facility] [address] [place], [place] [address] [phone]

## 2019-08-20 ENCOUNTER — Other Ambulatory Visit: Payer: Self-pay

## 2019-08-20 ENCOUNTER — Encounter: Payer: Self-pay | Admitting: Physical Therapy

## 2019-08-20 ENCOUNTER — Ambulatory Visit: Payer: Medicare Other | Admitting: Physical Therapy

## 2019-08-20 DIAGNOSIS — M21372 Foot drop, left foot: Secondary | ICD-10-CM

## 2019-08-20 DIAGNOSIS — M21371 Foot drop, right foot: Secondary | ICD-10-CM

## 2019-08-20 DIAGNOSIS — R296 Repeated falls: Secondary | ICD-10-CM

## 2019-08-20 DIAGNOSIS — R262 Difficulty in walking, not elsewhere classified: Secondary | ICD-10-CM

## 2019-08-20 DIAGNOSIS — M6281 Muscle weakness (generalized): Secondary | ICD-10-CM | POA: Diagnosis not present

## 2019-08-20 DIAGNOSIS — R2689 Other abnormalities of gait and mobility: Secondary | ICD-10-CM | POA: Diagnosis not present

## 2019-08-20 DIAGNOSIS — R2681 Unsteadiness on feet: Secondary | ICD-10-CM

## 2019-08-20 NOTE — Therapy (Addendum)
[facility] [address] [place], [place], [address] Phone: [phone]   Fax:  [phone]  Physical Therapy Treatment and 10th visit Progress Note  Patient Details  Name: Johnathan Ross MRN: [id number] Date of Birth: 01-Feb-1971 Referring Provider (PT): Carylon Perches, MD   Encounter Date: 08/20/2019   Progress Note Reporting Period 07/04/2019 to 08/13/19 but re-assessment performed on 3/25 - one visit later.  See note below for Objective Data and Assessment of Progress/Goals.   [name], PT, DPT 08/21/19    10:00 AM    PT End of Session - 08/20/19 0857    Visit Number  40    Number of Visits  53    Date for PT Re-Evaluation  10/02/19    Authorization Type  Medicare Part A and B; 10th visit PN    Progress Note Due on Visit  8    PT Start Time  [phone]    PT Stop Time  0930    PT Time Calculation (min)  44 min    Equipment Utilized During Treatment  Gait belt    Activity Tolerance  Patient tolerated treatment well   limited by spasticity   Behavior During Therapy  WFL for tasks assessed/performed       Past Medical History:  Diagnosis Date  . Hyperlipidemia   . Hypertension   . Hypogonadism in male 11/01/2016  . Hypothyroidism   . Multiple sclerosis ([place])   . Proliferative diabetic retinopathy(362.02)   . Type 1 diabetes mellitus ([place]) dx'd 1994  . Vitamin D insufficiency 11/29/2016    Past Surgical History:  Procedure Laterality Date  . EYE SURGERY Bilateral    "laser for diabetic retinopathy"  . FRACTURE SURGERY    . OPEN REDUCTION INTERNAL FIXATION (ORIF) TIBIA/FIBULA FRACTURE Right 10/30/2016  . ORIF ANKLE FRACTURE Left 2015  . ORIF TIBIA FRACTURE Right 10/30/2016   Procedure: OPEN REDUCTION INTERNAL FIXATION (ORIF) TIBIA FIBULA FRACTURE;  Surgeon: Altamese [place], MD;  Location: [facility];  Service: Orthopedics;  Laterality: Right;  . RETINAL LASER PROCEDURE Bilateral    "for diabetic  retinopathy"    There were no vitals filed for this visit.  Subjective Assessment - 08/20/19 0855    Subjective  No new complaitns. No falls or pain to report.    Patient is accompained by:  Family member   parents in car   Pertinent History  multiple falls, vitamin D insufficiency, Type 1 IDDM, diabetic retinopathy, hypothyroidism, HTN, hyperlipidemia, R tibia fracture, L ankle fracture with ORIF    Diagnostic tests  No new lesions seen on MRI    Patient Stated Goals  To improve walking    Currently in Pain?  No/denies    Pain Score  0-No pain         [facility] PT Assessment - 08/20/19 0858      Berg Balance Test   Sit to Stand  Able to stand  independently using hands    Standing Unsupported  Able to stand 2 minutes with supervision    Sitting with Back Unsupported but Feet Supported on Floor or Stool  Able to sit safely and securely 2 minutes    Stand to Sit  Controls descent by using hands    Transfers  Able to transfer safely, definite need of hands    Standing Unsupported with Eyes Closed  Able to stand 10 seconds with supervision    Standing Unsupported with Feet Together  Needs help to  attain position but able to stand for 30 seconds with feet together   needed UE support to obtain position   From Standing, Reach Forward with Outstretched Arm  Can reach forward >12 cm safely (5")   ~8 inches   From Standing Position, Pick up Object from Floor  Able to pick up shoe, needs supervision    From Standing Position, Turn to Look Behind Over each Shoulder  Turn sideways only but maintains balance    Turn 360 Degrees  Needs assistance while turning    Standing Unsupported, Alternately Place Feet on Step/Stool  Able to complete >2 steps/needs minimal assist    Standing Unsupported, One Foot in Front  Able to take small step independently and hold 30 seconds    Standing on One Leg  Able to lift leg independently and hold equal to or more than 3 seconds    Total Score  33    Berg  comment:  33/56= high risk for falls         [facility] Adult PT Treatment/Exercise - 08/20/19 0858      Transfers   Transfers  Sit to Stand;Stand to Sit    Sit to Stand  5: Supervision;With upper extremity assist;From bed;From chair/3-in-1    Stand to Sit  5: Supervision;With upper extremity assist;To bed;To chair/3-in-1    Floor to Transfer  5: Supervision;With upper extremity assist    Floor to Transfer Details (indicate cue type and reason)  to/from red mat on floor next to blue mat table with UE assist.       Ambulation/Gait   Ambulation/Gait  Yes    Ambulation/Gait Assistance  5: Supervision    Ambulation/Gait Assistance Details  cues/faciltation to initiate at hip for swing phase so not to "kick" in tone and to not use UE's to "lift" with gait.  cues to relax arms.     Ambulation Distance (Feet)  80 Feet   x2   Assistive device  Rolling walker    Gait Pattern  Decreased hip/knee flexion - right;Decreased hip/knee flexion - left;Decreased dorsiflexion - right;Lateral trunk lean to left;Step-through pattern;Right genu recurvatum;Poor foot clearance - right;Narrow base of support    Ambulation Surface  Level;Indoor      Neuro Re-ed    Neuro Re-ed Details   for strengthening/muscle re-ed: tall kneeling- mini squats with simultaneous chest press with 6# weighted ball x 10 reps, then with green band resistance for 10 reps. cues on form and technique; green band resisted side stepping for 2 laps each way, cues on posture with UE support on mat.           PT Short Term Goals - 08/13/19 1204      PT SHORT TERM GOAL #1   Title  Patient will demonstrate independence with updated HEP    Baseline  updated on 3/18    Time  4    Period  Weeks    Status  Revised    Target Date  09/03/19   target date extended due to pt on hold due to DVT in LUE     PT SHORT TERM GOAL #2   Title  Pt will improve gait velocity with RW to >/= 0.6 ft/sec with decreased use of UE to lift and swing RLE  through    Baseline  .53 with RW, full distance without use of UE extension and swinging LE through    Time  4    Period  Weeks    Status  Revised    Target Date  09/03/19      PT SHORT TERM GOAL #3   Title  Pt will improve BERG to >/= 30/56    Baseline  24/56    Time  4    Period  Weeks    Status  Revised    Target Date  09/03/19      PT SHORT TERM GOAL #4   Title  Pt will ambulate x 100' over indoor surfaces with RW and AFO MOD I with 2 standing rest breaks and improved R hip and knee flexion and foot clearance without lifting through UE    Baseline  115' Supervision with RW and AFO when performing gait with hip and knee flexion without swinging LE through    Time  4    Period  Weeks    Status  Revised    Target Date  09/03/19      PT SHORT TERM GOAL #5   Title  Pt will negotate 8 stairs with two rails step to sequence with supervision    Baseline  6 steps alternating sequence with 2 rails and supervision, final two steps required min A for alternating sequence    Time  4    Period  Weeks    Status  Revised    Target Date  09/03/19        PT Long Term Goals - 07/04/19 1225      PT LONG TERM GOAL #1   Title  Pt will demonstrate independence with land HEP    Time  12    Period  Weeks    Status  Revised    Target Date  10/02/19      PT LONG TERM GOAL #2   Title  Pt will improve gait velocity with RW to >/= 1.8 ft/sec    Time  12    Period  Weeks    Status  Revised    Target Date  10/02/19      PT LONG TERM GOAL #3   Title  Pt will improve BERG to >/= 38/56 to decrease falls risk    Time  12    Period  Weeks    Status  Revised    Target Date  10/02/19      PT LONG TERM GOAL #4   Title  Pt will ambulate x 200' outside over pavement and grass with supervision and LRAD to improve safety ambulating to wood working shop at home    Time  12    Period  Weeks    Status  Revised    Target Date  10/02/19      PT LONG TERM GOAL #5   Title  Pt will negotiate 8  stairs with 2 rails with alternating sequence with appropriate AFO/Bioness with supervision    Time  12    Period  Weeks    Status  Revised    Target Date  10/02/19            Plan - 08/20/19 0857    Clinical Impression Statement  Today's skilled session initially focused on assessment of Berg Balance Test with pt improving score from 24/56 to 33/56 scored today. Remainder of session continued to address core/LE strengthening and gait with RW with emphasis on UE relaxation and initiation at the hips. The pt is making steady progress toward goals and should benefit from continued PT to progress toward unmet goals.    Comorbidities  multiple falls,  vitamin D insufficiency, Type 1 IDDM, diabetic retinopathy, hypothyroidism, HTN, hyperlipidemia, R tibia fracture, L ankle fracture with ORIF    Rehab Potential  Fair    PT Frequency  2x / week    PT Duration  12 weeks    PT Treatment/Interventions  ADLs/Self Care Home Management;Aquatic Therapy;Electrical Stimulation;DME Instruction;Gait training;Stair training;Functional mobility training;Therapeutic activities;Therapeutic exercise;Balance training;Neuromuscular re-education;Patient/family education;Orthotic Fit/Training;Passive range of motion;Energy conservation    PT Next Visit Plan  Really needs to work on hip ABD strengthening open and closed chain!  Bioness on hold for now; Standing resisted hip/knee flexion, walking with resistance?  Focus on gentle activation of hip and knee flexion in supine/standing/gait to avoid overflow to extensors, gait at counter with cane.  Quadruped or seated core strengthening.  Continue to focus on core/postural control training without UE support in standing, hip flexion and knee flexion strengthening, SLS and weight shift training (rockerboard). tall kneeling for proximal strengthening    PT Home Exercise Plan  Revised HEP on 3/18    Consulted and Agree with Plan of Care  Patient       Patient will benefit  from skilled therapeutic intervention in order to improve the following deficits and impairments:  Abnormal gait, Decreased balance, Decreased endurance, Decreased coordination, Decreased range of motion, Decreased strength, Difficulty walking, Impaired tone, Postural dysfunction, Decreased activity tolerance, Decreased mobility  Visit Diagnosis: Other abnormalities of gait and mobility  Muscle weakness (generalized)  Unsteadiness on feet  Difficulty in walking, not elsewhere classified  Repeated falls  Foot drop, right  Foot drop, left     Problem List Patient Active Problem List   Diagnosis Date Noted  . Gait disturbance 08/19/2019  . Acquired diplegia ([place]) 08/19/2019  . Vitamin D insufficiency 11/29/2016  . Hypogonadism in male 11/01/2016  . 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[place])   . Hypothyroidism   . Hypertension   . Hyperlipidemia   . Closed fracture of right fibula and tibia 10/29/2016  . Closed tibia fracture 10/29/2016  . Closed fracture of right tibial plafond with fibula involvement 10/29/2016  . Abnormal liver function tests 03/06/2013  . RASH AND OTHER NONSPECIFIC SKIN ERUPTION 10/07/2009  . SHOULDER PAIN, LEFT 12/27/2008  . Type I (juvenile type) diabetes mellitus without mention of complication, uncontrolled 12/03/2007  . HYPOTHYROIDISM 11/12/2007  . HYPERCHOLESTEROLEMIA 11/12/2007  . MULTIPLE SCLEROSIS 08/29/2007  . PROLIFERATIVE DIABETIC RETINOPATHY 08/29/2007  . HYPERTENSION 12/25/2006    Willow Ora, PTA, [facility] [address] [place], [place] [address] [phone] 08/20/19, 3:41 PM   Name: MYKOL GOYTIA MRN: [id number] Date of Birth: 09-27-1970

## 2019-08-21 ENCOUNTER — Ambulatory Visit: Payer: Medicare Other | Admitting: Physical Therapy

## 2019-08-21 DIAGNOSIS — M21372 Foot drop, left foot: Secondary | ICD-10-CM | POA: Diagnosis not present

## 2019-08-21 DIAGNOSIS — R2689 Other abnormalities of gait and mobility: Secondary | ICD-10-CM

## 2019-08-21 DIAGNOSIS — M6281 Muscle weakness (generalized): Secondary | ICD-10-CM | POA: Diagnosis not present

## 2019-08-21 DIAGNOSIS — R2681 Unsteadiness on feet: Secondary | ICD-10-CM

## 2019-08-21 DIAGNOSIS — R262 Difficulty in walking, not elsewhere classified: Secondary | ICD-10-CM | POA: Diagnosis not present

## 2019-08-21 DIAGNOSIS — M21371 Foot drop, right foot: Secondary | ICD-10-CM | POA: Diagnosis not present

## 2019-08-21 DIAGNOSIS — R296 Repeated falls: Secondary | ICD-10-CM

## 2019-08-21 NOTE — Therapy (Signed)
[facility] [address] [place], [place], [address] Phone: [phone]   Fax:  [phone]  Physical Therapy Treatment  Patient Details  Name: Johnathan Ross MRN: [id number] Date of Birth: [birth year] Referring Provider (PT): Carylon Perches, MD   Encounter Date: 08/21/2019  PT End of Session - 08/21/19 0950    Visit Number  41    Number of Visits  52    Date for PT Re-Evaluation  10/02/19    Authorization Type  Medicare Part A and B; 10th visit PN    Progress Note Due on Visit  57    PT Start Time  0846    PT Stop Time  0930    PT Time Calculation (min)  44 min    Equipment Utilized During Treatment  Gait belt    Activity Tolerance  Patient tolerated treatment well   limited by spasticity   Behavior During Therapy  WFL for tasks assessed/performed       Past Medical History:  Diagnosis Date  . Hyperlipidemia   . Hypertension   . Hypogonadism in male 11/01/2016  . Hypothyroidism   . Multiple sclerosis ([place])   . Proliferative diabetic retinopathy(362.02)   . Type 1 diabetes mellitus ([place]) dx'd 1994  . Vitamin D insufficiency 11/29/2016    Past Surgical History:  Procedure Laterality Date  . EYE SURGERY Bilateral    "laser for diabetic retinopathy"  . FRACTURE SURGERY    . OPEN REDUCTION INTERNAL FIXATION (ORIF) TIBIA/FIBULA FRACTURE Right 10/30/2016  . ORIF ANKLE FRACTURE Left 2015  . ORIF TIBIA FRACTURE Right 10/30/2016   Procedure: OPEN REDUCTION INTERNAL FIXATION (ORIF) TIBIA FIBULA FRACTURE;  Surgeon: Altamese [place], MD;  Location: [facility];  Service: Orthopedics;  Laterality: Right;  . RETINAL LASER PROCEDURE Bilateral    "for diabetic retinopathy"    There were no vitals filed for this visit.  Subjective Assessment - 08/21/19 0854    Subjective  Had a good visit with Dr. Felecia Shelling.  Started Baclofen - 3x/day, felt a little weak this morning but able to come to therapy.    Patient is accompained by:   Family member   parents in car   Pertinent History  multiple falls, vitamin D insufficiency, Type 1 IDDM, diabetic retinopathy, hypothyroidism, HTN, hyperlipidemia, R tibia fracture, L ankle fracture with ORIF    Diagnostic tests  No new lesions seen on MRI    Patient Stated Goals  To improve walking    Currently in Pain?  No/denies                       [facility] Adult PT Treatment/Exercise - 08/21/19 0935      Ambulation/Gait   Ambulation/Gait  Yes    Ambulation/Gait Assistance  5: Supervision    Ambulation/Gait Assistance Details  still requires multiple standing rest breaks to decrease activation of extensor tone and continue to facilitate more normal gait sequence with hip and knee flexion and to ensure R foot clearance    Ambulation Distance (Feet)  115 Feet    Assistive device  Rolling walker    Gait Pattern  Step-through pattern;Decreased step length - right;Decreased stride length;Decreased hip/knee flexion - right;Decreased dorsiflexion - right;Right genu recurvatum;Poor foot clearance - right    Ambulation Surface  Level;Indoor      Neuro Re-ed    Neuro Re-ed Details   Proximal stability, core and closed chain hip ABD strengthening in tall kneeling and then  in supported/unsupported standing.  In tall kneeling placed black theraband around hips/pelvis on R side and then L side pulling patient laterally to facilitate same side closed chain hip ABD 2 sets x 10 reps each side, 1 set each holding pelvis in midline against resistance and letting go of UE support while performing 10 reps head turns, head nods and holding x 10 seconds x 2 reps.  Required facilitation to keep trunk in midline.  Performed again in standing with UE support on RW and performed same stabilization on R and L with Feet apart, letting go of UE support and holding x 10 seconds.  Also performed x 3 sets each side with feet together.  Attempted to perform hold in midline while lifting contralateral LE (with  UE support) but pt continued to demonstrate increased trunk lean and extensor tone limiting hip flexion ROM.  Transitioned to placing band around pelvis and pulling posteriorly and having pt use hip extension (while keeping knees soft) to shift COG forwards and maintaining x 10 reps without UE support.             PT Education - 08/21/19 0950    Education Details  how pt can use black theraband at home to perform same strengthening and balance work    [employer]) Educated  Patient    Methods  Explanation;Demonstration    Comprehension  Verbalized understanding       PT Short Term Goals - 08/13/19 1204      PT SHORT TERM GOAL #1   Title  Patient will demonstrate independence with updated HEP    Baseline  updated on 3/18    Time  4    Period  Weeks    Status  Revised    Target Date  09/03/19   target date extended due to pt on hold due to DVT in LUE     PT SHORT TERM GOAL #2   Title  Pt will improve gait velocity with RW to >/= 0.6 ft/sec with decreased use of UE to lift and swing RLE through    Baseline  .52 with RW, full distance without use of UE extension and swinging LE through    Time  4    Period  Weeks    Status  Revised    Target Date  09/03/19      PT SHORT TERM GOAL #3   Title  Pt will improve BERG to >/= 30/56    Baseline  24/56    Time  4    Period  Weeks    Status  Revised    Target Date  09/03/19      PT SHORT TERM GOAL #4   Title  Pt will ambulate x 100' over indoor surfaces with RW and AFO MOD I with 2 standing rest breaks and improved R hip and knee flexion and foot clearance without lifting through UE    Baseline  115' Supervision with RW and AFO when performing gait with hip and knee flexion without swinging LE through    Time  4    Period  Weeks    Status  Revised    Target Date  09/03/19      PT SHORT TERM GOAL #5   Title  Pt will negotate 8 stairs with two rails step to sequence with supervision    Baseline  6 steps alternating sequence with 2  rails and supervision, final two steps required min A for alternating sequence  Time  4    Period  Weeks    Status  Revised    Target Date  09/03/19        PT Long Term Goals - 07/04/19 1225      PT LONG TERM GOAL #1   Title  Pt will demonstrate independence with land HEP    Time  12    Period  Weeks    Status  Revised    Target Date  10/02/19      PT LONG TERM GOAL #2   Title  Pt will improve gait velocity with RW to >/= 1.8 ft/sec    Time  12    Period  Weeks    Status  Revised    Target Date  10/02/19      PT LONG TERM GOAL #3   Title  Pt will improve BERG to >/= 38/56 to decrease falls risk    Time  12    Period  Weeks    Status  Revised    Target Date  10/02/19      PT LONG TERM GOAL #4   Title  Pt will ambulate x 200' outside over pavement and grass with supervision and LRAD to improve safety ambulating to wood working shop at home    Time  12    Period  Weeks    Status  Revised    Target Date  10/02/19      PT LONG TERM GOAL #5   Title  Pt will negotiate 8 stairs with 2 rails with alternating sequence with appropriate AFO/Bioness with supervision    Time  12    Period  Weeks    Status  Revised    Target Date  10/02/19            Plan - 08/21/19 0951    Clinical Impression Statement  Treatment session focused primarily on static standing balance, proximal stability and closed chain hip ABD strengthening in talling kneeling and standing with and without UE support, wide BOS and narrow BOS against resistance of black theraband.  Pt tolerated well with no significant LOB.  Will continue to address and progress towards LTG.    Comorbidities  multiple falls, vitamin D insufficiency, Type 1 IDDM, diabetic retinopathy, hypothyroidism, HTN, hyperlipidemia, R tibia fracture, L ankle fracture with ORIF    Rehab Potential  Fair    PT Frequency  2x / week    PT Duration  12 weeks    PT Treatment/Interventions  ADLs/Self Care Home Management;Aquatic  Therapy;Electrical Stimulation;DME Instruction;Gait training;Stair training;Functional mobility training;Therapeutic activities;Therapeutic exercise;Balance training;Neuromuscular re-education;Patient/family education;Orthotic Fit/Training;Passive range of motion;Energy conservation    PT Next Visit Plan  Really needs to work on hip ABD strengthening open and closed chain!  Bioness on hold for now; Standing resisted hip/knee flexion, walking with resistance?  Focus on gentle activation of hip and knee flexion in supine/standing/gait to avoid overflow to extensors, gait at counter with cane.  Quadruped or seated core strengthening.  Continue to focus on core/postural control training without UE support in standing, hip flexion and knee flexion strengthening, SLS and weight shift training (rockerboard). tall kneeling for proximal strengthening    PT Home Exercise Plan  Revised HEP on 3/18    Consulted and Agree with Plan of Care  Patient       Patient will benefit from skilled therapeutic intervention in order to improve the following deficits and impairments:  Abnormal gait, Decreased balance, Decreased endurance, Decreased coordination, Decreased  range of motion, Decreased strength, Difficulty walking, Impaired tone, Postural dysfunction, Decreased activity tolerance, Decreased mobility  Visit Diagnosis: Other abnormalities of gait and mobility  Muscle weakness (generalized)  Unsteadiness on feet  Difficulty in walking, not elsewhere classified  Repeated falls  Foot drop, right     Problem List Patient Active Problem List   Diagnosis Date Noted  . Gait disturbance 08/19/2019  . Acquired diplegia ([place]) 08/19/2019  . Vitamin D insufficiency 11/29/2016  . Hypogonadism in male 11/01/2016  . 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[place])   . Hypothyroidism   . Hypertension   . Hyperlipidemia   . Closed fracture of right fibula and tibia 10/29/2016  . Closed tibia fracture  10/29/2016  . Closed fracture of right tibial plafond with fibula involvement 10/29/2016  . Abnormal liver function tests 03/06/2013  . RASH AND OTHER NONSPECIFIC SKIN ERUPTION 10/07/2009  . SHOULDER PAIN, LEFT 12/27/2008  . Type I (juvenile type) diabetes mellitus without mention of complication, uncontrolled 12/03/2007  . HYPOTHYROIDISM 11/12/2007  . HYPERCHOLESTEROLEMIA 11/12/2007  . MULTIPLE SCLEROSIS 08/29/2007  . PROLIFERATIVE DIABETIC RETINOPATHY 08/29/2007  . HYPERTENSION 12/25/2006    [name], PT, DPT 08/21/19    9:55 AM    [facility] [address] [place], [place], [address] Phone: [phone]   Fax:  [phone]  Name: Johnathan Ross MRN: [id number] Date of Birth: 12/13/1970

## 2019-08-25 ENCOUNTER — Other Ambulatory Visit: Payer: Self-pay

## 2019-08-25 ENCOUNTER — Ambulatory Visit: Payer: Medicare Other | Admitting: Physical Therapy

## 2019-08-25 ENCOUNTER — Encounter: Payer: Self-pay | Admitting: Physical Therapy

## 2019-08-25 DIAGNOSIS — M21371 Foot drop, right foot: Secondary | ICD-10-CM | POA: Diagnosis not present

## 2019-08-25 DIAGNOSIS — R2689 Other abnormalities of gait and mobility: Secondary | ICD-10-CM | POA: Diagnosis not present

## 2019-08-25 DIAGNOSIS — R262 Difficulty in walking, not elsewhere classified: Secondary | ICD-10-CM

## 2019-08-25 DIAGNOSIS — R296 Repeated falls: Secondary | ICD-10-CM

## 2019-08-25 DIAGNOSIS — M21372 Foot drop, left foot: Secondary | ICD-10-CM

## 2019-08-25 DIAGNOSIS — M6281 Muscle weakness (generalized): Secondary | ICD-10-CM | POA: Diagnosis not present

## 2019-08-25 DIAGNOSIS — R2681 Unsteadiness on feet: Secondary | ICD-10-CM | POA: Diagnosis not present

## 2019-08-26 ENCOUNTER — Ambulatory Visit: Payer: Self-pay | Admitting: Physical Therapy

## 2019-08-26 NOTE — Therapy (Signed)
[facility] [address] [place], [place], [address] Phone: [phone]   Fax:  [phone]  Physical Therapy Treatment  Patient Details  Name: Johnathan Ross MRN: [id number] Date of Birth: July 03, 1970 Referring Provider (PT): Carylon Perches, MD   Encounter Date: 08/25/2019  PT End of Session - 08/25/19 0853    Visit Number  42    Number of Visits  39    Date for PT Re-Evaluation  10/02/19    Authorization Type  Medicare Part A and B; 10th visit PN    Progress Note Due on Visit  44    PT Start Time  0849    PT Stop Time  0930    PT Time Calculation (min)  41 min    Equipment Utilized During Treatment  Gait belt    Activity Tolerance  Patient tolerated treatment well   limited by spasticity   Behavior During Therapy  WFL for tasks assessed/performed       Past Medical History:  Diagnosis Date  . Hyperlipidemia   . Hypertension   . Hypogonadism in male 11/01/2016  . Hypothyroidism   . Multiple sclerosis ([place])   . Proliferative diabetic retinopathy(362.02)   . Type 1 diabetes mellitus ([place]) dx'd 1994  . Vitamin D insufficiency 11/29/2016    Past Surgical History:  Procedure Laterality Date  . EYE SURGERY Bilateral    "laser for diabetic retinopathy"  . FRACTURE SURGERY    . OPEN REDUCTION INTERNAL FIXATION (ORIF) TIBIA/FIBULA FRACTURE Right 10/30/2016  . ORIF ANKLE FRACTURE Left 2015  . ORIF TIBIA FRACTURE Right 10/30/2016   Procedure: OPEN REDUCTION INTERNAL FIXATION (ORIF) TIBIA FIBULA FRACTURE;  Surgeon: Altamese [place], MD;  Location: [facility];  Service: Orthopedics;  Laterality: Right;  . RETINAL LASER PROCEDURE Bilateral    "for diabetic retinopathy"    There were no vitals filed for this visit.  Subjective Assessment - 08/25/19 0851    Subjective  No new complaints. Not taking the Baclofen 3x a day, only 2x a day a this time. Pt is concerned that the 3x a day will make him too tired. No falls or pain.     Patient is accompained by:  Family member   parents in car   Pertinent History  multiple falls, vitamin D insufficiency, Type 1 IDDM, diabetic retinopathy, hypothyroidism, HTN, hyperlipidemia, R tibia fracture, L ankle fracture with ORIF    Limitations  Walking    Diagnostic tests  No new lesions seen on MRI    Patient Stated Goals  To improve walking    Currently in Pain?  No/denies    Pain Score  0-No pain         [facility] Adult PT Treatment/Exercise - 08/25/19 0855      Transfers   Transfers  Sit to Stand;Stand to Sit    Sit to Stand  5: Supervision;With upper extremity assist;From bed;From chair/3-in-1    Stand to Sit  5: Supervision;With upper extremity assist;To bed;To chair/3-in-1    Floor to Transfer  5: Supervision;With upper extremity assist    Floor to Transfer Details (indicate cue type and reason)  to/from red mat on floor with UE support on mat table.      Ambulation/Gait   Ambulation/Gait  Yes    Ambulation/Gait Assistance  5: Supervision    Ambulation/Gait Assistance Details  continues to need cues to relax UE's and slowly initiatate stepping at hip to avoid excessive extensor tone kicking in. continues to  need multiple rest breaks with gait, epecially after taking several "good" steps.     Ambulation Distance (Feet)  80 Feet   x2   Assistive device  Rolling walker    Gait Pattern  Step-through pattern;Decreased step length - right;Decreased stride length;Decreased hip/knee flexion - right;Decreased dorsiflexion - right;Right genu recurvatum;Poor foot clearance - right    Ambulation Surface  Level;Indoor      Neuro Re-ed    Neuro Re-ed Details   for strengthening/musle re-ed: in tall kneeling on red mat- PTA provided sustained lateral pull with black theraband to pt's hip while he performed the following (done on both sides)- shifting into the band to increase tension/back to central position for 10 reps, then holding centeral position for alternating UE raises, then  without UE support head movements left<>right, up<>down with intermittent touch to mat for balance recovery. cues needed for posture and ex form; with black band around pelvis with PTA pulling posterior direction- mini squats for 10 reps wiht cues for full upright return against the resistance; with lateral pulling with black band at thigh (performed both sides)- moving LE out/in for 10 reps, then moving LE fwd/bwd for 10 reps, with light UE support, cues on posture/form/technique; with 3# weighted ball in tall kneeling- lifting ball up over one shoulder/diagonal back down to mat with emphasis on trunk rotation for 10 reps each side with up to min assist for balance; standing with RW support (second person providing RW support/balance assist as needed)- red band resisted forward stepping to 4 inch box, PTA providing graded resistance for 10 reps each side. increased difficulty on left>right side. cues to relax UE's and initiate at hip.  assist needed at knee to faciliation flexion with stepping.            PT Short Term Goals - 08/13/19 1204      PT SHORT TERM GOAL #1   Title  Patient will demonstrate independence with updated HEP    Baseline  updated on 3/18    Time  4    Period  Weeks    Status  Revised    Target Date  09/03/19   target date extended due to pt on hold due to DVT in LUE     PT SHORT TERM GOAL #2   Title  Pt will improve gait velocity with RW to >/= 0.6 ft/sec with decreased use of UE to lift and swing RLE through    Baseline  .53 with RW, full distance without use of UE extension and swinging LE through    Time  4    Period  Weeks    Status  Revised    Target Date  09/03/19      PT SHORT TERM GOAL #3   Title  Pt will improve BERG to >/= 30/56    Baseline  24/56    Time  4    Period  Weeks    Status  Revised    Target Date  09/03/19      PT SHORT TERM GOAL #4   Title  Pt will ambulate x 100' over indoor surfaces with RW and AFO MOD I with 2 standing rest breaks  and improved R hip and knee flexion and foot clearance without lifting through UE    Baseline  115' Supervision with RW and AFO when performing gait with hip and knee flexion without swinging LE through    Time  4    Period  Weeks  Status  Revised    Target Date  09/03/19      PT SHORT TERM GOAL #5   Title  Pt will negotate 8 stairs with two rails step to sequence with supervision    Baseline  6 steps alternating sequence with 2 rails and supervision, final two steps required min A for alternating sequence    Time  4    Period  Weeks    Status  Revised    Target Date  09/03/19        PT Long Term Goals - 07/04/19 1225      PT LONG TERM GOAL #1   Title  Pt will demonstrate independence with land HEP    Time  12    Period  Weeks    Status  Revised    Target Date  10/02/19      PT LONG TERM GOAL #2   Title  Pt will improve gait velocity with RW to >/= 1.8 ft/sec    Time  12    Period  Weeks    Status  Revised    Target Date  10/02/19      PT LONG TERM GOAL #3   Title  Pt will improve BERG to >/= 38/56 to decrease falls risk    Time  12    Period  Weeks    Status  Revised    Target Date  10/02/19      PT LONG TERM GOAL #4   Title  Pt will ambulate x 200' outside over pavement and grass with supervision and LRAD to improve safety ambulating to wood working shop at home    Time  12    Period  Weeks    Status  Revised    Target Date  10/02/19      PT LONG TERM GOAL #5   Title  Pt will negotiate 8 stairs with 2 rails with alternating sequence with appropriate AFO/Bioness with supervision    Time  12    Period  Weeks    Status  Revised    Target Date  10/02/19            Plan - 08/25/19 0854    Clinical Impression Statement  Today's skilled session continued to focus on proximal stability, hip/core strengthening and standing LE strengthening with rest breaks taken due to fatigue. Cues with gait to relax UE's and initiated at hip slowly so not to increase  extensor tone. The pt is making progress toward goals and should benefit from continued PT to progress toward unmet goals.    Comorbidities  multiple falls, vitamin D insufficiency, Type 1 IDDM, diabetic retinopathy, hypothyroidism, HTN, hyperlipidemia, R tibia fracture, L ankle fracture with ORIF    Rehab Potential  Fair    PT Frequency  2x / week    PT Duration  12 weeks    PT Treatment/Interventions  ADLs/Self Care Home Management;Aquatic Therapy;Electrical Stimulation;DME Instruction;Gait training;Stair training;Functional mobility training;Therapeutic activities;Therapeutic exercise;Balance training;Neuromuscular re-education;Patient/family education;Orthotic Fit/Training;Passive range of motion;Energy conservation    PT Next Visit Plan  Really needs to work on hip ABD strengthening open and closed chain!  Bioness on hold for now; Standing resisted hip/knee flexion, walking with resistance?  Focus on gentle activation of hip and knee flexion in supine/standing/gait to avoid overflow to extensors, gait at counter with cane.  Quadruped or seated core strengthening.  Continue to focus on core/postural control training without UE support in standing, hip flexion and knee flexion strengthening,  SLS and weight shift training (rockerboard). tall kneeling for proximal strengthening    PT Home Exercise Plan  Revised HEP on 3/18    Consulted and Agree with Plan of Care  Patient       Patient will benefit from skilled therapeutic intervention in order to improve the following deficits and impairments:  Abnormal gait, Decreased balance, Decreased endurance, Decreased coordination, Decreased range of motion, Decreased strength, Difficulty walking, Impaired tone, Postural dysfunction, Decreased activity tolerance, Decreased mobility  Visit Diagnosis: Other abnormalities of gait and mobility  Muscle weakness (generalized)  Unsteadiness on feet  Difficulty in walking, not elsewhere classified  Foot drop,  right  Foot drop, left  Repeated falls     Problem List Patient Active Problem List   Diagnosis Date Noted  . Gait disturbance 08/19/2019  . Acquired diplegia ([facility]) 08/19/2019  . Vitamin D insufficiency 11/29/2016  . Hypogonadism in male 11/01/2016  . 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[place])   . Hypothyroidism   . Hypertension   . Hyperlipidemia   . Closed fracture of right fibula and tibia 10/29/2016  . Closed tibia fracture 10/29/2016  . Closed fracture of right tibial plafond with fibula involvement 10/29/2016  . Abnormal liver function tests 03/06/2013  . RASH AND OTHER NONSPECIFIC SKIN ERUPTION 10/07/2009  . SHOULDER PAIN, LEFT 12/27/2008  . Type I (juvenile type) diabetes mellitus without mention of complication, uncontrolled 12/03/2007  . HYPOTHYROIDISM 11/12/2007  . HYPERCHOLESTEROLEMIA 11/12/2007  . MULTIPLE SCLEROSIS 08/29/2007  . PROLIFERATIVE DIABETIC RETINOPATHY 08/29/2007  . HYPERTENSION 12/25/2006    Willow Ora, PTA, [facility] [address], [place], [place] [address] [phone] 08/26/19, 8:35 AM   Name: Johnathan Ross MRN: [id number] Date of Birth: 01-Jan-1971

## 2019-08-27 ENCOUNTER — Ambulatory Visit: Payer: Medicare Other | Attending: Urology | Admitting: Physical Therapy

## 2019-08-27 ENCOUNTER — Encounter: Payer: Self-pay | Admitting: Physical Therapy

## 2019-08-27 ENCOUNTER — Other Ambulatory Visit: Payer: Self-pay

## 2019-08-27 DIAGNOSIS — R2681 Unsteadiness on feet: Secondary | ICD-10-CM | POA: Diagnosis not present

## 2019-08-27 DIAGNOSIS — M21372 Foot drop, left foot: Secondary | ICD-10-CM | POA: Diagnosis not present

## 2019-08-27 DIAGNOSIS — M6281 Muscle weakness (generalized): Secondary | ICD-10-CM

## 2019-08-27 DIAGNOSIS — M21371 Foot drop, right foot: Secondary | ICD-10-CM | POA: Diagnosis not present

## 2019-08-27 DIAGNOSIS — R262 Difficulty in walking, not elsewhere classified: Secondary | ICD-10-CM | POA: Diagnosis not present

## 2019-08-27 DIAGNOSIS — R2689 Other abnormalities of gait and mobility: Secondary | ICD-10-CM

## 2019-08-27 DIAGNOSIS — R296 Repeated falls: Secondary | ICD-10-CM | POA: Diagnosis not present

## 2019-08-27 NOTE — Therapy (Signed)
[facility] [address] [place], [place], [address] Phone: [phone]   Fax:  [phone]  Physical Therapy Treatment  Patient Details  Name: Johnathan Ross MRN: [id number] Date of Birth: 1971-05-19 Referring Provider (PT): Carylon Perches, MD   Encounter Date: 08/27/2019  PT End of Session - 08/27/19 2204    Visit Number  43    Number of Visits  36    Date for PT Re-Evaluation  10/02/19    Authorization Type  Medicare Part A and B; 10th visit PN    Progress Note Due on Visit  72    PT Start Time  0853    PT Stop Time  0931    PT Time Calculation (min)  38 min    Activity Tolerance  Patient tolerated treatment well   limited by spasticity   Behavior During Therapy  [facility] for tasks assessed/performed       Past Medical History:  Diagnosis Date  . Hyperlipidemia   . Hypertension   . Hypogonadism in male 11/01/2016  . Hypothyroidism   . Multiple sclerosis ([place])   . Proliferative diabetic retinopathy(362.02)   . Type 1 diabetes mellitus ([place]) dx'd 1994  . Vitamin D insufficiency 11/29/2016    Past Surgical History:  Procedure Laterality Date  . EYE SURGERY Bilateral    "laser for diabetic retinopathy"  . FRACTURE SURGERY    . OPEN REDUCTION INTERNAL FIXATION (ORIF) TIBIA/FIBULA FRACTURE Right 10/30/2016  . ORIF ANKLE FRACTURE Left 2015  . ORIF TIBIA FRACTURE Right 10/30/2016   Procedure: OPEN REDUCTION INTERNAL FIXATION (ORIF) TIBIA FIBULA FRACTURE;  Surgeon: Altamese [place], MD;  Location: [facility];  Service: Orthopedics;  Laterality: Right;  . RETINAL LASER PROCEDURE Bilateral    "for diabetic retinopathy"    There were no vitals filed for this visit.  Subjective Assessment - 08/27/19 0858    Subjective  Didn't take Baclofen this morning, was concerned how it would affect him.  No issues to report.  Asking about how to know if he is making progress.    Patient is accompained by:  Family member   parents in  car   Pertinent History  multiple falls, vitamin D insufficiency, Type 1 IDDM, diabetic retinopathy, hypothyroidism, HTN, hyperlipidemia, R tibia fracture, L ankle fracture with ORIF    Limitations  Walking    Diagnostic tests  No new lesions seen on MRI    Patient Stated Goals  To improve walking    Currently in Pain?  No/denies                       [facility] Adult PT Treatment/Exercise - 08/27/19 0918      Ambulation/Gait   Ambulation/Gait  Yes    Ambulation/Gait Assistance  5: Supervision    Ambulation/Gait Assistance Details  continues to require multiple standing rest breaks in order to continue to perform active hip and knee flexion during swing phase    Ambulation Distance (Feet)  115 Feet    Assistive device  Rolling walker    Gait Pattern  Step-through pattern;Decreased step length - right;Decreased stride length;Decreased hip/knee flexion - right;Decreased dorsiflexion - right;Right genu recurvatum;Poor foot clearance - right    Ambulation Surface  Level;Indoor      Therapeutic Activites    Therapeutic Activities  Other Therapeutic Activities    Other Therapeutic Activities  Discussed evidence of progress in therapy as evidenced in objective measures and functionally at home.  Discussed lifelong effects of MS and how progress may not be linear and may have dips in progress.      Neuro Re-ed    Neuro Re-ed Details   Tall kneeling on mat performed lateral stepping to L and R x 4 laps each direction against resistance of green theraband and then holding at end resistance x 10 seconds without UE support; also performed tall kneeling walking forwards and backwards x 3 laps against resistance of green theraband with LUE support on mat.  Transitioned to half kneeling on each side and holding without UE support but with mod A to maintain balance for 10 seconds at a time.             PT Education - 08/27/19 2204    Education Details  see TA    Person(s) Educated   Patient    Methods  Explanation    Comprehension  Verbalized understanding       PT Short Term Goals - 08/13/19 1204      PT SHORT TERM GOAL #1   Title  Patient will demonstrate independence with updated HEP    Baseline  updated on 3/18    Time  4    Period  Weeks    Status  Revised    Target Date  09/03/19   target date extended due to pt on hold due to DVT in LUE     PT SHORT TERM GOAL #2   Title  Pt will improve gait velocity with RW to >/= 0.6 ft/sec with decreased use of UE to lift and swing RLE through    Baseline  .65 with RW, full distance without use of UE extension and swinging LE through    Time  4    Period  Weeks    Status  Revised    Target Date  09/03/19      PT SHORT TERM GOAL #3   Title  Pt will improve BERG to >/= 30/56    Baseline  24/56    Time  4    Period  Weeks    Status  Revised    Target Date  09/03/19      PT SHORT TERM GOAL #4   Title  Pt will ambulate x 100' over indoor surfaces with RW and AFO MOD I with 2 standing rest breaks and improved R hip and knee flexion and foot clearance without lifting through UE    Baseline  115' Supervision with RW and AFO when performing gait with hip and knee flexion without swinging LE through    Time  4    Period  Weeks    Status  Revised    Target Date  09/03/19      PT SHORT TERM GOAL #5   Title  Pt will negotate 8 stairs with two rails step to sequence with supervision    Baseline  6 steps alternating sequence with 2 rails and supervision, final two steps required min A for alternating sequence    Time  4    Period  Weeks    Status  Revised    Target Date  09/03/19        PT Long Term Goals - 07/04/19 1225      PT LONG TERM GOAL #1   Title  Pt will demonstrate independence with land HEP    Time  12    Period  Weeks    Status  Revised    Target Date  10/02/19      PT LONG TERM GOAL #2   Title  Pt will improve gait velocity with RW to >/= 1.8 ft/sec    Time  12    Period  Weeks    Status   Revised    Target Date  10/02/19      PT LONG TERM GOAL #3   Title  Pt will improve BERG to >/= 38/56 to decrease falls risk    Time  12    Period  Weeks    Status  Revised    Target Date  10/02/19      PT LONG TERM GOAL #4   Title  Pt will ambulate x 200' outside over pavement and grass with supervision and LRAD to improve safety ambulating to wood working shop at home    Time  12    Period  Weeks    Status  Revised    Target Date  10/02/19      PT LONG TERM GOAL #5   Title  Pt will negotiate 8 stairs with 2 rails with alternating sequence with appropriate AFO/Bioness with supervision    Time  12    Period  Weeks    Status  Revised    Target Date  10/02/19            Plan - 08/27/19 2205    Clinical Impression Statement  Continued to utilize tall kneeling activities with resistance of theraband for continued proximal stability, core strengthening and single limb stance stability during more dynamic movement.  Attempted half kneeling again today with pt continuing to require significant assistance to perform and stabilize.  Will continue to address in order to progress towards LTG.    Comorbidities  multiple falls, vitamin D insufficiency, Type 1 IDDM, diabetic retinopathy, hypothyroidism, HTN, hyperlipidemia, R tibia fracture, L ankle fracture with ORIF    Rehab Potential  Fair    PT Frequency  2x / week    PT Duration  12 weeks    PT Treatment/Interventions  ADLs/Self Care Home Management;Aquatic Therapy;Electrical Stimulation;DME Instruction;Gait training;Stair training;Functional mobility training;Therapeutic activities;Therapeutic exercise;Balance training;Neuromuscular re-education;Patient/family education;Orthotic Fit/Training;Passive range of motion;Energy conservation    PT Next Visit Plan  Really needs to work on hip ABD strengthening open and closed chain!  Standing resisted hip/knee flexion, walking with resistance?  Focus on gentle activation of hip and knee flexion  in supine/standing/gait to avoid overflow to extensors, gait at counter with cane.  Quadruped or seated core strengthening.  Continue to focus on core/postural control training without UE support in standing, hip flexion and knee flexion strengthening, SLS and weight shift training (rockerboard). tall kneeling for proximal strengthening    PT Home Exercise Plan  Revised HEP on 3/18    Consulted and Agree with Plan of Care  Patient       Patient will benefit from skilled therapeutic intervention in order to improve the following deficits and impairments:  Abnormal gait, Decreased balance, Decreased endurance, Decreased coordination, Decreased range of motion, Decreased strength, Difficulty walking, Impaired tone, Postural dysfunction, Decreased activity tolerance, Decreased mobility  Visit Diagnosis: Other abnormalities of gait and mobility  Muscle weakness (generalized)  Unsteadiness on feet  Difficulty in walking, not elsewhere classified  Foot drop, right  Repeated falls     Problem List Patient Active Problem List   Diagnosis Date Noted  . Gait disturbance 08/19/2019  . Acquired diplegia ([place]) 08/19/2019  . Vitamin D insufficiency 11/29/2016  . Hypogonadism in male 11/01/2016  .  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 Hypothyroidism   . Hypertension   . Hyperlipidemia   . Closed fracture of right fibula and tibia 10/29/2016  . Closed tibia fracture 10/29/2016  . Closed fracture of right tibial plafond with fibula involvement 10/29/2016  . Abnormal liver function tests 03/06/2013  . RASH AND OTHER NONSPECIFIC SKIN ERUPTION 10/07/2009  . SHOULDER PAIN, LEFT 12/27/2008  . Type I (juvenile type) diabetes mellitus without mention of complication, uncontrolled 12/03/2007  . HYPOTHYROIDISM 11/12/2007  . HYPERCHOLESTEROLEMIA 11/12/2007  . MULTIPLE SCLEROSIS 08/29/2007  . PROLIFERATIVE DIABETIC RETINOPATHY 08/29/2007  . HYPERTENSION 12/25/2006   [name], PT, DPT 08/27/19    10:13 PM    [facility] [address] [place], [place], [address] Phone: [phone]   Fax:  [phone]  Name: Johnathan Ross MRN: [id number] Date of Birth: Oct 21, 1970

## 2019-08-28 ENCOUNTER — Other Ambulatory Visit: Payer: Self-pay | Admitting: Endocrinology

## 2019-09-03 ENCOUNTER — Encounter: Payer: Self-pay | Admitting: Physical Therapy

## 2019-09-03 ENCOUNTER — Other Ambulatory Visit: Payer: Self-pay

## 2019-09-03 ENCOUNTER — Ambulatory Visit: Payer: Medicare Other | Admitting: Physical Therapy

## 2019-09-03 DIAGNOSIS — R262 Difficulty in walking, not elsewhere classified: Secondary | ICD-10-CM | POA: Diagnosis not present

## 2019-09-03 DIAGNOSIS — M21371 Foot drop, right foot: Secondary | ICD-10-CM

## 2019-09-03 DIAGNOSIS — M21372 Foot drop, left foot: Secondary | ICD-10-CM

## 2019-09-03 DIAGNOSIS — R296 Repeated falls: Secondary | ICD-10-CM

## 2019-09-03 DIAGNOSIS — R2689 Other abnormalities of gait and mobility: Secondary | ICD-10-CM | POA: Diagnosis not present

## 2019-09-03 DIAGNOSIS — R2681 Unsteadiness on feet: Secondary | ICD-10-CM | POA: Diagnosis not present

## 2019-09-03 DIAGNOSIS — M6281 Muscle weakness (generalized): Secondary | ICD-10-CM

## 2019-09-03 NOTE — Therapy (Signed)
[facility] [address] [place], [place], [address] Phone: [phone]   Fax:  [phone]  Physical Therapy Treatment  Patient Details  Name: Johnathan Ross MRN: [id number] Date of Birth: 1970-11-11 Referring Provider (PT): Carylon Perches, MD   Encounter Date: 09/03/2019  PT End of Session - 09/03/19 0852    Visit Number  44    Number of Visits  53    Date for PT Re-Evaluation  10/02/19    Authorization Type  Medicare Part A and B; 10th visit PN    Progress Note Due on Visit  20    PT Start Time  0847    PT Stop Time  0930    PT Time Calculation (min)  43 min    Equipment Utilized During Treatment  Gait belt    Activity Tolerance  Patient tolerated treatment well;No increased pain   limited by spasticity   Behavior During Therapy  [facility] for tasks assessed/performed       Past Medical History:  Diagnosis Date  . Hyperlipidemia   . Hypertension   . Hypogonadism in male 11/01/2016  . Hypothyroidism   . Multiple sclerosis ([place])   . Proliferative diabetic retinopathy(362.02)   . Type 1 diabetes mellitus ([place]) dx'd 1994  . Vitamin D insufficiency 11/29/2016    Past Surgical History:  Procedure Laterality Date  . EYE SURGERY Bilateral    "laser for diabetic retinopathy"  . FRACTURE SURGERY    . OPEN REDUCTION INTERNAL FIXATION (ORIF) TIBIA/FIBULA FRACTURE Right 10/30/2016  . ORIF ANKLE FRACTURE Left 2015  . ORIF TIBIA FRACTURE Right 10/30/2016   Procedure: OPEN REDUCTION INTERNAL FIXATION (ORIF) TIBIA FIBULA FRACTURE;  Surgeon: Altamese [place], MD;  Location: [facility];  Service: Orthopedics;  Laterality: Right;  . RETINAL LASER PROCEDURE Bilateral    "for diabetic retinopathy"    There were no vitals filed for this visit.  Subjective Assessment - 09/03/19 0850    Subjective  No new complaints. Drove himself to therapy today. Is taking the Baclofen on average twice a day vs the three times it's prescribed to  take.    Pertinent History  multiple falls, vitamin D insufficiency, Type 1 IDDM, diabetic retinopathy, hypothyroidism, HTN, hyperlipidemia, R tibia fracture, L ankle fracture with ORIF    Limitations  Walking    Diagnostic tests  No new lesions seen on MRI    Patient Stated Goals  To improve walking    Currently in Pain?  No/denies    Pain Score  0-No pain         [facility] PT Assessment - 09/03/19 0857      Transfers   Transfers  Sit to Stand;Stand to Sit    Sit to Stand  5: Supervision;With upper extremity assist;From bed;From chair/3-in-1    Stand to Sit  5: Supervision;With upper extremity assist;To bed;To chair/3-in-1      Ambulation/Gait   Ambulation/Gait  Yes    Ambulation/Gait Assistance  5: Supervision    Ambulation/Gait Assistance Details  pt able to ambulate the 100 feet with 4 brief standing rest breaks for 3-5 sec's each while utilizing hip/knee flexion with LE advancement along with relaxed UE's.     Ambulation Distance (Feet)  100 Feet   X1, 80 x1, plus around gym with testing   Assistive device  Rolling walker    Gait Pattern  Step-through pattern;Decreased step length - right;Decreased stride length;Decreased hip/knee flexion - right;Decreased dorsiflexion - right;Right genu recurvatum;Poor foot clearance -  right    Ambulation Surface  Level;Indoor      Standardized Balance Assessment   Standardized Balance Assessment  10 meter walk test    10 Meter Walk  47.0 sec's- .70 ft/sec with RW and AFO      Berg Balance Test   Sit to Stand  Able to stand  independently using hands    Standing Unsupported  Able to stand safely 2 minutes    Sitting with Back Unsupported but Feet Supported on Floor or Stool  Able to sit safely and securely 2 minutes    Stand to Sit  Controls descent by using hands    Transfers  Able to transfer safely, definite need of hands    Standing Unsupported with Eyes Closed  Able to stand 10 seconds with supervision    Standing Unsupported with Feet  Together  Able to place feet together independently and stand for 1 minute with supervision    From Standing, Reach Forward with Outstretched Arm  Can reach confidently >25 cm (10")    From Standing Position, Pick up Object from [place] to pick up shoe, needs supervision    From Standing Position, Turn to Look Behind Over each Shoulder  Looks behind one side only/other side shows less weight shift   left>right   Turn 360 Degrees  Needs assistance while turning   limited by extensor tone locking out knees   Standing Unsupported, Alternately Place Feet on Step/Stool  Able to complete >2 steps/needs minimal assist   UE support, limited by extensor tone locking out knees   Standing Unsupported, One Foot in Front  Able to plae foot ahead of the other independently and hold 30 seconds    Standing on One Leg  Tries to lift leg/unable to hold 3 seconds but remains standing independently    Total Score  38    Berg comment:  38/56= significant risk for falls           PT Short Term Goals - 09/03/19 0853      PT SHORT TERM GOAL #1   Title  Patient will demonstrate independence with updated HEP    Baseline  09/03/19: met with current program    Status  Achieved    Target Date  --      PT SHORT TERM GOAL #2   Title  Pt will improve gait velocity with RW to >/= 0.6 ft/sec with decreased use of UE to lift and swing RLE through    Baseline  09/03/19: 0.70 ft/sec with RW and AFO with decreased use of UE's to lift/swing through    Time  --    Period  --    Status  Achieved    Target Date  --      PT SHORT TERM GOAL #3   Title  Pt will improve BERG to >/= 30/56    Baseline  09/03/19: 38/56 scored today    Time  --    Period  --    Status  Achieved    Target Date  --      PT SHORT TERM GOAL #4   Title  Pt will ambulate x 100' over indoor surfaces with RW and AFO MOD I with 2 standing rest breaks and improved R hip and knee flexion and foot clearance without lifting through UE    Baseline   09/03/19: met distance as stated with 4 brief ( 3-5 sec's) rest breaks this session, improved just not to  goal    Time  --    Period  --    Status  Partially Met    Target Date  09/03/19      PT SHORT TERM GOAL #5   Title  Pt will negotate 8 stairs with two rails step to sequence with supervision    Baseline  6 steps alternating sequence with 2 rails and supervision, final two steps required min A for alternating sequence    Time  4    Period  Weeks    Status  On-going    Target Date  09/03/19        PT Long Term Goals - 07/04/19 1225      PT LONG TERM GOAL #1   Title  Pt will demonstrate independence with land HEP    Time  12    Period  Weeks    Status  Revised    Target Date  10/02/19      PT LONG TERM GOAL #2   Title  Pt will improve gait velocity with RW to >/= 1.8 ft/sec    Time  12    Period  Weeks    Status  Revised    Target Date  10/02/19      PT LONG TERM GOAL #3   Title  Pt will improve BERG to >/= 38/56 to decrease falls risk    Time  12    Period  Weeks    Status  Revised    Target Date  10/02/19      PT LONG TERM GOAL #4   Title  Pt will ambulate x 200' outside over pavement and grass with supervision and LRAD to improve safety ambulating to wood working shop at home    Time  12    Period  Weeks    Status  Revised    Target Date  10/02/19      PT LONG TERM GOAL #5   Title  Pt will negotiate 8 stairs with 2 rails with alternating sequence with appropriate AFO/Bioness with supervision    Time  12    Period  Weeks    Status  Revised    Target Date  10/02/19            Plan - 09/03/19 9983    Clinical Impression Statement  Today's skilled session focused on progress toward STGs with three goals met and one goal partially met. Will plan to check remaining goal at next session. The pt is progressing toward goals. Continues to be limited with standing tasks/gait due to extensor tone, which appears to be improving. The pt should benefit from continued  PT to progress toward unmet goals.    Comorbidities  multiple falls, vitamin D insufficiency, Type 1 IDDM, diabetic retinopathy, hypothyroidism, HTN, hyperlipidemia, R tibia fracture, L ankle fracture with ORIF    Rehab Potential  Fair    PT Frequency  2x / week    PT Duration  12 weeks    PT Treatment/Interventions  ADLs/Self Care Home Management;Aquatic Therapy;Electrical Stimulation;DME Instruction;Gait training;Stair training;Functional mobility training;Therapeutic activities;Therapeutic exercise;Balance training;Neuromuscular re-education;Patient/family education;Orthotic Fit/Training;Passive range of motion;Energy conservation    PT Next Visit Plan  Check remaining STG; Really needs to work on hip ABD strengthening open and closed chain!  Standing resisted hip/knee flexion, walking with resistance?  Focus on gentle activation of hip and knee flexion in supine/standing/gait to avoid overflow to extensors, gait at counter with cane.  Quadruped or seated core strengthening.  Continue to focus on core/postural control training without UE support in standing, hip flexion and knee flexion strengthening, SLS and weight shift training (rockerboard). tall kneeling for proximal strengthening    PT Home Exercise Plan  Revised HEP on 3/18    Consulted and Agree with Plan of Care  Patient       Patient will benefit from skilled therapeutic intervention in order to improve the following deficits and impairments:  Abnormal gait, Decreased balance, Decreased endurance, Decreased coordination, Decreased range of motion, Decreased strength, Difficulty walking, Impaired tone, Postural dysfunction, Decreased activity tolerance, Decreased mobility  Visit Diagnosis: Other abnormalities of gait and mobility  Muscle weakness (generalized)  Unsteadiness on feet  Difficulty in walking, not elsewhere classified  Foot drop, right  Repeated falls  Foot drop, left     Problem List Patient Active Problem  List   Diagnosis Date Noted  . Gait disturbance 08/19/2019  . Acquired diplegia ([place]) 08/19/2019  . Vitamin D insufficiency 11/29/2016  . Hypogonadism in male 11/01/2016  . 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[place])   . Hypothyroidism   . Hypertension   . Hyperlipidemia   . Closed fracture of right fibula and tibia 10/29/2016  . Closed tibia fracture 10/29/2016  . Closed fracture of right tibial plafond with fibula involvement 10/29/2016  . Abnormal liver function tests 03/06/2013  . RASH AND OTHER NONSPECIFIC SKIN ERUPTION 10/07/2009  . SHOULDER PAIN, LEFT 12/27/2008  . Type I (juvenile type) diabetes mellitus without mention of complication, uncontrolled 12/03/2007  . HYPOTHYROIDISM 11/12/2007  . HYPERCHOLESTEROLEMIA 11/12/2007  . MULTIPLE SCLEROSIS 08/29/2007  . PROLIFERATIVE DIABETIC RETINOPATHY 08/29/2007  . HYPERTENSION 12/25/2006    Willow Ora, PTA, [facility] [address], [place], [place] [address] [phone] 09/03/19, 7:42 PM   Name: JOVANI FLURY MRN: [id number] Date of Birth: 1971-03-27

## 2019-09-04 ENCOUNTER — Encounter: Payer: Self-pay | Admitting: Physical Therapy

## 2019-09-04 ENCOUNTER — Ambulatory Visit: Payer: Medicare Other | Admitting: Physical Therapy

## 2019-09-04 DIAGNOSIS — M21371 Foot drop, right foot: Secondary | ICD-10-CM | POA: Diagnosis not present

## 2019-09-04 DIAGNOSIS — R2681 Unsteadiness on feet: Secondary | ICD-10-CM | POA: Diagnosis not present

## 2019-09-04 DIAGNOSIS — M21372 Foot drop, left foot: Secondary | ICD-10-CM

## 2019-09-04 DIAGNOSIS — R296 Repeated falls: Secondary | ICD-10-CM | POA: Diagnosis not present

## 2019-09-04 DIAGNOSIS — R2689 Other abnormalities of gait and mobility: Secondary | ICD-10-CM

## 2019-09-04 DIAGNOSIS — M6281 Muscle weakness (generalized): Secondary | ICD-10-CM | POA: Diagnosis not present

## 2019-09-04 DIAGNOSIS — R262 Difficulty in walking, not elsewhere classified: Secondary | ICD-10-CM | POA: Diagnosis not present

## 2019-09-04 NOTE — Therapy (Signed)
[facility] [address] [place], [place], [address] Phone: [phone]   Fax:  [phone]  Physical Therapy Treatment  Patient Details  Name: Johnathan Ross MRN: [id number] Date of Birth: 1970-10-11 Referring Provider (PT): Carylon Perches, MD   Encounter Date: 09/04/2019  PT End of Session - 09/04/19 0853    Visit Number  45    Number of Visits  53    Date for PT Re-Evaluation  10/02/19    Authorization Type  Medicare Part A and B; 10th visit PN    Progress Note Due on Visit  57    PT Start Time  0847    PT Stop Time  0929    PT Time Calculation (min)  42 min    Equipment Utilized During Treatment  Gait belt    Activity Tolerance  Patient tolerated treatment well;No increased pain   limited by spasticity   Behavior During Therapy  [facility] for tasks assessed/performed       Past Medical History:  Diagnosis Date  . Hyperlipidemia   . Hypertension   . Hypogonadism in male 11/01/2016  . Hypothyroidism   . Multiple sclerosis ([place])   . Proliferative diabetic retinopathy(362.02)   . Type 1 diabetes mellitus ([place]) dx'd 1994  . Vitamin D insufficiency 11/29/2016    Past Surgical History:  Procedure Laterality Date  . EYE SURGERY Bilateral    "laser for diabetic retinopathy"  . FRACTURE SURGERY    . OPEN REDUCTION INTERNAL FIXATION (ORIF) TIBIA/FIBULA FRACTURE Right 10/30/2016  . ORIF ANKLE FRACTURE Left 2015  . ORIF TIBIA FRACTURE Right 10/30/2016   Procedure: OPEN REDUCTION INTERNAL FIXATION (ORIF) TIBIA FIBULA FRACTURE;  Surgeon: Altamese [place], MD;  Location: [facility];  Service: Orthopedics;  Laterality: Right;  . RETINAL LASER PROCEDURE Bilateral    "for diabetic retinopathy"    There were no vitals filed for this visit.  Subjective Assessment - 09/04/19 0852    Subjective  No new complaints. No falls or pain to report. Reports not too tired yesterday after session.    Patient is accompained by:  Family member    parents in car   Pertinent History  multiple falls, vitamin D insufficiency, Type 1 IDDM, diabetic retinopathy, hypothyroidism, HTN, hyperlipidemia, R tibia fracture, L ankle fracture with ORIF    Limitations  Walking    Diagnostic tests  No new lesions seen on MRI    Patient Stated Goals  To improve walking    Currently in Pain?  No/denies    Pain Score  0-No pain            [facility] Adult PT Treatment/Exercise - 09/04/19 0853      Transfers   Transfers  Sit to Stand;Stand to Sit      Ambulation/Gait   Ambulation/Gait  Yes    Ambulation/Gait Assistance  5: Supervision    Ambulation/Gait Assistance Details  continues to need cues to relax UE's, inititate at hip and keep knees soft to decrease extensor tone. continues to need standing rest breaks, more so when fatigued.     Ambulation Distance (Feet)  90 Feet   x2, 120 x1   Assistive device  Rolling walker    Gait Pattern  Step-through pattern;Decreased step length - right;Decreased stride length;Decreased hip/knee flexion - right;Decreased dorsiflexion - right;Right genu recurvatum;Poor foot clearance - right    Ambulation Surface  Level;Indoor    Stairs  Yes    Stairs Assistance  5: Supervision;4: Min  guard    Stairs Assistance Details (indicate cue type and reason)  extensor tone locking out left LE with descending every time, only 2-3 times with ascending. Pt working to keep knees soft to decrease extensor tone without success.     Stair Management Technique  Two rails;Alternating pattern;Forwards    AFO   Number of Stairs  4   x2   Height of Stairs  6      Neuro Re-ed    Neuro Re-ed Details   for strengthening/muscle re-ed: in tall kneeling with 2# weighted ball- mini squats with concurrent chest press for 10 reps, then trunk rotion/flexion with reaching up over one shoulder<>down to mat on opposite side while holding ball with both hands for 10 reps each side, min guard assist for balance with cues on form/technique;  performed on both sides with black band resistance around hips (sustained pull by PTA)- alternating UE raises, progressing to bil UE raises for 8-10 reps each, then with no UE support head movements up<>down, left<>right with touch to mat for balance needed frequently. pt also loosing balance and sitting back on feet several times as well. Rest breaks taken to allow for recovery; with black band around pelvis for resisted mini squats for 10 reps with cues for full return to tall kneeling and controlled lowering with squats.             PT Short Term Goals - 09/04/19 1500      PT SHORT TERM GOAL #1   Title  Patient will demonstrate independence with updated HEP    Baseline  09/03/19: met with current program    Status  Achieved      PT SHORT TERM GOAL #2   Title  Pt will improve gait velocity with RW to >/= 0.6 ft/sec with decreased use of UE to lift and swing RLE through    Baseline  09/03/19: 0.70 ft/sec with RW and AFO with decreased use of UE's to lift/swing through    Status  Achieved      PT SHORT TERM GOAL #3   Title  Pt will improve BERG to >/= 30/56    Baseline  09/03/19: 38/56 scored today    Status  Achieved      PT SHORT TERM GOAL #4   Title  Pt will ambulate x 100' over indoor surfaces with RW and AFO MOD I with 2 standing rest breaks and improved R hip and knee flexion and foot clearance without lifting through UE    Baseline  09/03/19: met distance as stated with 4 brief ( 3-5 sec's) rest breaks this session, improved just not to goal    Status  Partially Met    Target Date  09/03/19      PT SHORT TERM GOAL #5   Title  Pt will negotate 8 stairs with two rails step to sequence with supervision    Baseline  09/04/19: pt using bil rails and reciprocal pattern with extensor tone locking out left LE, supervision assist only.    Status  Achieved        PT Long Term Goals - 07/04/19 1225      PT LONG TERM GOAL #1   Title  Pt will demonstrate independence with land HEP    Time   12    Period  Weeks    Status  Revised    Target Date  10/02/19      PT LONG TERM GOAL #2   Title  Pt will improve gait velocity with RW to >/= 1.8 ft/sec    Time  12    Period  Weeks    Status  Revised    Target Date  10/02/19      PT LONG TERM GOAL #3   Title  Pt will improve BERG to >/= 38/56 to decrease falls risk    Time  12    Period  Weeks    Status  Revised    Target Date  10/02/19      PT LONG TERM GOAL #4   Title  Pt will ambulate x 200' outside over pavement and grass with supervision and LRAD to improve safety ambulating to wood working shop at home    Time  12    Period  Weeks    Status  Revised    Target Date  10/02/19      PT LONG TERM GOAL #5   Title  Pt will negotiate 8 stairs with 2 rails with alternating sequence with appropriate AFO/Bioness with supervision    Time  12    Period  Weeks    Status  Revised    Target Date  10/02/19            Plan - 09/04/19 0853    Clinical Impression Statement  Today's skilled session initially addressed remaining STG with stair goal met. Remainder of session continued to focus on core and hip strengthening with rest breaks taken as needed due to fatigue. The pt is progressing toward goals and should benenfit from continued PT to progress toward unmet goals.    Comorbidities  multiple falls, vitamin D insufficiency, Type 1 IDDM, diabetic retinopathy, hypothyroidism, HTN, hyperlipidemia, R tibia fracture, L ankle fracture with ORIF    Rehab Potential  Fair    PT Frequency  2x / week    PT Duration  12 weeks    PT Treatment/Interventions  ADLs/Self Care Home Management;Aquatic Therapy;Electrical Stimulation;DME Instruction;Gait training;Stair training;Functional mobility training;Therapeutic activities;Therapeutic exercise;Balance training;Neuromuscular re-education;Patient/family education;Orthotic Fit/Training;Passive range of motion;Energy conservation    PT Next Visit Plan  Really needs to work on hip ABD  strengthening open and closed chain!  Standing resisted hip/knee flexion, walking with resistance?  Focus on gentle activation of hip and knee flexion in supine/standing/gait to avoid overflow to extensors, gait at counter with cane.  Quadruped or seated core strengthening.  Continue to focus on core/postural control training without UE support in standing, hip flexion and knee flexion strengthening, SLS and weight shift training (rockerboard). tall kneeling for proximal strengthening    PT Home Exercise Plan  Revised HEP on 3/18    Consulted and Agree with Plan of Care  Patient       Patient will benefit from skilled therapeutic intervention in order to improve the following deficits and impairments:  Abnormal gait, Decreased balance, Decreased endurance, Decreased coordination, Decreased range of motion, Decreased strength, Difficulty walking, Impaired tone, Postural dysfunction, Decreased activity tolerance, Decreased mobility  Visit Diagnosis: Other abnormalities of gait and mobility  Muscle weakness (generalized)  Unsteadiness on feet  Difficulty in walking, not elsewhere classified  Foot drop, right  Foot drop, left     Problem List Patient Active Problem List   Diagnosis Date Noted  . Gait disturbance 08/19/2019  . Acquired diplegia ([place]) 08/19/2019  . Vitamin D insufficiency 11/29/2016  . Hypogonadism in male 11/01/2016  . 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 Hypothyroidism   . Hypertension   .  Hyperlipidemia   . Closed fracture of right fibula and tibia 10/29/2016  . Closed tibia fracture 10/29/2016  . Closed fracture of right tibial plafond with fibula involvement 10/29/2016  . Abnormal liver function tests 03/06/2013  . RASH AND OTHER NONSPECIFIC SKIN ERUPTION 10/07/2009  . SHOULDER PAIN, LEFT 12/27/2008  . Type I (juvenile type) diabetes mellitus without mention of complication, uncontrolled 12/03/2007  . HYPOTHYROIDISM 11/12/2007  .  HYPERCHOLESTEROLEMIA 11/12/2007  . MULTIPLE SCLEROSIS 08/29/2007  . PROLIFERATIVE DIABETIC RETINOPATHY 08/29/2007  . HYPERTENSION 12/25/2006    Willow Ora, PTA, [facility] [address] [place], [place] [address] [phone] 09/04/19, 3:04 PM   Name: ANTONEY BIVEN MRN: [id number] Date of Birth: March 17, 1971

## 2019-09-09 ENCOUNTER — Encounter: Payer: Self-pay | Admitting: Physical Therapy

## 2019-09-09 ENCOUNTER — Other Ambulatory Visit: Payer: Self-pay

## 2019-09-09 ENCOUNTER — Ambulatory Visit: Payer: Medicare Other | Admitting: Physical Therapy

## 2019-09-09 DIAGNOSIS — R2681 Unsteadiness on feet: Secondary | ICD-10-CM

## 2019-09-09 DIAGNOSIS — M21372 Foot drop, left foot: Secondary | ICD-10-CM

## 2019-09-09 DIAGNOSIS — R2689 Other abnormalities of gait and mobility: Secondary | ICD-10-CM

## 2019-09-09 DIAGNOSIS — R262 Difficulty in walking, not elsewhere classified: Secondary | ICD-10-CM | POA: Diagnosis not present

## 2019-09-09 DIAGNOSIS — R296 Repeated falls: Secondary | ICD-10-CM | POA: Diagnosis not present

## 2019-09-09 DIAGNOSIS — M6281 Muscle weakness (generalized): Secondary | ICD-10-CM | POA: Diagnosis not present

## 2019-09-09 DIAGNOSIS — M21371 Foot drop, right foot: Secondary | ICD-10-CM | POA: Diagnosis not present

## 2019-09-10 NOTE — Therapy (Signed)
[facility] [address] [place], [place], [address] Phone: [phone]   Fax:  [phone]  Physical Therapy Treatment  Patient Details  Name: Johnathan Ross MRN: [id number] Date of Birth: 1970-12-25 Referring Provider (PT): Johnathan Perches, MD   Encounter Date: 09/09/2019  PT End of Session - 09/09/19 0854    Visit Number  6    Number of Visits  53    Date for PT Re-Evaluation  10/02/19    Authorization Type  Medicare Part A and B; 10th visit PN    Progress Note Due on Visit  82    PT Start Time  0847    PT Stop Time  0930    PT Time Calculation (min)  43 min    Equipment Utilized During Treatment  Gait belt    Activity Tolerance  Patient tolerated treatment well;No increased pain   limited by spasticity   Behavior During Therapy  [facility] for tasks assessed/performed       Past Medical History:  Diagnosis Date  . Hyperlipidemia   . Hypertension   . Hypogonadism in male 11/01/2016  . Hypothyroidism   . Multiple sclerosis ([place])   . Proliferative diabetic retinopathy(362.02)   . Type 1 diabetes mellitus ([place]) dx'd 1994  . Vitamin D insufficiency 11/29/2016    Past Surgical History:  Procedure Laterality Date  . EYE SURGERY Bilateral    "laser for diabetic retinopathy"  . FRACTURE SURGERY    . OPEN REDUCTION INTERNAL FIXATION (ORIF) TIBIA/FIBULA FRACTURE Right 10/30/2016  . ORIF ANKLE FRACTURE Left 2015  . ORIF TIBIA FRACTURE Right 10/30/2016   Procedure: OPEN REDUCTION INTERNAL FIXATION (ORIF) TIBIA FIBULA FRACTURE;  Surgeon: Johnathan Cre[place]eur, MD;  Location: [facility];  Service: Orthopedics;  Laterality: Right;  . RETINAL LASER PROCEDURE Bilateral    "for diabetic retinopathy"    There were no vitals filed for this visit.  Subjective Assessment - 09/09/19 0853    Subjective  No new complaints. No falls or pain to report.    Patient is accompained by:  Family member   mom in car   Pertinent History  multiple  falls, vitamin D insufficiency, Type 1 IDDM, diabetic retinopathy, hypothyroidism, HTN, hyperlipidemia, R tibia fracture, L ankle fracture with ORIF    Limitations  Walking    Diagnostic tests  No new lesions seen on MRI    Patient Stated Goals  To improve walking    Currently in Pain?  No/denies    Pain Score  0-No pain          [facility] Adult PT Treatment/Exercise - 09/09/19 0854      Transfers   Transfers  Sit to Stand;Stand to Sit    Sit to Stand  5: Supervision;With upper extremity assist;From bed;From chair/3-in-1    Stand to Sit  5: Supervision;With upper extremity assist;To bed;To chair/3-in-1      Ambulation/Gait   Ambulation/Gait  Yes    Ambulation/Gait Assistance  5: Supervision    Ambulation/Gait Assistance Details  working on initiation at hip/knee with relaxed UE's with gait. several stops needed, howver pt did not use arms to lift up with gait entire session.  pt does well if he is able to maintain slight knee flexion/"soft" knees     Ambulation Distance (Feet)  115 Feet   x1, 90 x2   Assistive device  Rolling walker    Gait Pattern  Step-through pattern;Decreased step length - right;Decreased stride length;Decreased hip/knee flexion - right;Decreased  dorsiflexion - right;Right genu recurvatum;Poor foot clearance - right    Ambulation Surface  Level;Indoor      Neuro Re-ed    Neuro Re-ed Details   for strengthening/muscle re-ed: in tall kneeling on red mat on floor next to blue mat table-   With green band resistance mini squats for 10 reps in midline, then 10 reps laterally toward each side, intermittent UE support on mat table with cues to return to full upright tall kneeling. Increased difficulty with lateral squats noted;  With PTA applying sustained pull at hips in lateral directions (performed pulling toward both sides)- with 3# weighted ball UE raises for 10 reps, then upper trunk rotation for 10 reps each way with intermittent support needed on mat table, cues to  maintain hip/trunk extension. Then with arms at sides pt performed head movements left<>right, then up<>down for 6-7 reps each. UE support needed for balance at times on mat table with continued cues for posture. Rest breaks taken between ex's as needed due to fatigue.           PT Short Term Goals - 09/04/19 1500      PT SHORT TERM GOAL #1   Title  Patient will demonstrate independence with updated HEP    Baseline  09/03/19: met with current program    Status  Achieved      PT SHORT TERM GOAL #2   Title  Pt will improve gait velocity with RW to >/= 0.6 ft/sec with decreased use of UE to lift and swing RLE through    Baseline  09/03/19: 0.70 ft/sec with RW and AFO with decreased use of UE's to lift/swing through    Status  Achieved      PT SHORT TERM GOAL #3   Title  Pt will improve BERG to >/= 30/56    Baseline  09/03/19: 38/56 scored today    Status  Achieved      PT SHORT TERM GOAL #4   Title  Pt will ambulate x 100' over indoor surfaces with RW and AFO MOD I with 2 standing rest breaks and improved R hip and knee flexion and foot clearance without lifting through UE    Baseline  09/03/19: met distance as stated with 4 brief ( 3-5 sec's) rest breaks this session, improved just not to goal    Status  Partially Met    Target Date  09/03/19      PT SHORT TERM GOAL #5   Title  Pt will negotate 8 stairs with two rails step to sequence with supervision    Baseline  09/04/19: pt using bil rails and reciprocal pattern with extensor tone locking out left LE, supervision assist only.    Status  Achieved        PT Long Term Goals - 07/04/19 1225      PT LONG TERM GOAL #1   Title  Pt will demonstrate independence with land HEP    Time  12    Period  Weeks    Status  Revised    Target Date  10/02/19      PT LONG TERM GOAL #2   Title  Pt will improve gait velocity with RW to >/= 1.8 ft/sec    Time  12    Period  Weeks    Status  Revised    Target Date  10/02/19      PT LONG TERM  GOAL #3   Title  Pt will improve BERG to >/=  38/56 to decrease falls risk    Time  12    Period  Weeks    Status  Revised    Target Date  10/02/19      PT LONG TERM GOAL #4   Title  Pt will ambulate x 200' outside over pavement and grass with supervision and LRAD to improve safety ambulating to wood working shop at home    Time  12    Period  Weeks    Status  Revised    Target Date  10/02/19      PT LONG TERM GOAL #5   Title  Pt will negotiate 8 stairs with 2 rails with alternating sequence with appropriate AFO/Bioness with supervision    Time  12    Period  Weeks    Status  Revised    Target Date  10/02/19            Plan - 09/09/19 0854    Clinical Impression Statement  Today's skilled session continued to focus on gait mechanics with emphasis on relaxed UE's, soft knees and slow movements inititation at hips to decrease extensor tone kicking in with no UE use with gait to "lift up". Continues to need several rest breaks to "reset" and rest due to fatigue. Remainder of session continued to address core and LE (specifically hip)  strengthening with emphasis on flexion movements with rest breaks taken as needed. The pt is progressing toward goals and should benefit from continued PT to progress toward unmet goals.    Comorbidities  multiple falls, vitamin D insufficiency, Type 1 IDDM, diabetic retinopathy, hypothyroidism, HTN, hyperlipidemia, R tibia fracture, L ankle fracture with ORIF    Rehab Potential  Fair    PT Frequency  2x / week    PT Duration  12 weeks    PT Treatment/Interventions  ADLs/Self Care Home Management;Aquatic Therapy;Electrical Stimulation;DME Instruction;Gait training;Stair training;Functional mobility training;Therapeutic activities;Therapeutic exercise;Balance training;Neuromuscular re-education;Patient/family education;Orthotic Fit/Training;Passive range of motion;Energy conservation    PT Next Visit Plan  Really needs to work on hip ABD strengthening open  and closed chain!  Standing resisted hip/knee flexion, walking with resistance?  Focus on gentle activation of hip and knee flexion in supine/standing/gait to avoid overflow to extensors, gait at counter with cane.  Quadruped or seated core strengthening.  Continue to focus on core/postural control training without UE support in standing, hip flexion and knee flexion strengthening, SLS and weight shift training (rockerboard). tall kneeling for proximal strengthening    PT Home Exercise Plan  Revised HEP on 3/18    Consulted and Agree with Plan of Care  Patient       Patient will benefit from skilled therapeutic intervention in order to improve the following deficits and impairments:  Abnormal gait, Decreased balance, Decreased endurance, Decreased coordination, Decreased range of motion, Decreased strength, Difficulty walking, Impaired tone, Postural dysfunction, Decreased activity tolerance, Decreased mobility  Visit Diagnosis: Other abnormalities of gait and mobility  Muscle weakness (generalized)  Unsteadiness on feet  Difficulty in walking, not elsewhere classified  Foot drop, right  Foot drop, left     Problem List Patient Active Problem List   Diagnosis Date Noted  . Gait disturbance 08/19/2019  . Acquired diplegia ([place]) 08/19/2019  . Vitamin D insufficiency 11/29/2016  . Hypogonadism in male 11/01/2016  . 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[place])   . Hypothyroidism   . Hypertension   . Hyperlipidemia   . Closed fracture of right fibula and tibia 10/29/2016  .  Closed tibia fracture 10/29/2016  . Closed fracture of right tibial plafond with fibula involvement 10/29/2016  . Abnormal liver function tests 03/06/2013  . RASH AND OTHER NONSPECIFIC SKIN ERUPTION 10/07/2009  . SHOULDER PAIN, LEFT 12/27/2008  . Type I (juvenile type) diabetes mellitus without mention of complication, uncontrolled 12/03/2007  . HYPOTHYROIDISM 11/12/2007  . HYPERCHOLESTEROLEMIA  11/12/2007  . MULTIPLE SCLEROSIS 08/29/2007  . PROLIFERATIVE DIABETIC RETINOPATHY 08/29/2007  . HYPERTENSION 12/25/2006    Willow Ora, PTA, [facility] [address] [place], [place] [address] [phone] 09/10/19, 9:45 AM   Name: Johnathan Ross MRN: [id number] Date of Birth: Jul 14, 1970

## 2019-09-11 ENCOUNTER — Encounter: Payer: Self-pay | Admitting: Physical Therapy

## 2019-09-11 ENCOUNTER — Other Ambulatory Visit: Payer: Self-pay

## 2019-09-11 ENCOUNTER — Ambulatory Visit: Payer: Medicare Other | Admitting: Physical Therapy

## 2019-09-11 DIAGNOSIS — R262 Difficulty in walking, not elsewhere classified: Secondary | ICD-10-CM

## 2019-09-11 DIAGNOSIS — M6281 Muscle weakness (generalized): Secondary | ICD-10-CM

## 2019-09-11 DIAGNOSIS — R2681 Unsteadiness on feet: Secondary | ICD-10-CM | POA: Diagnosis not present

## 2019-09-11 DIAGNOSIS — R2689 Other abnormalities of gait and mobility: Secondary | ICD-10-CM

## 2019-09-11 DIAGNOSIS — R296 Repeated falls: Secondary | ICD-10-CM

## 2019-09-11 DIAGNOSIS — M21371 Foot drop, right foot: Secondary | ICD-10-CM | POA: Diagnosis not present

## 2019-09-11 DIAGNOSIS — M21372 Foot drop, left foot: Secondary | ICD-10-CM

## 2019-09-11 NOTE — Therapy (Signed)
[facility] [address] [place], [place], [address] Phone: [phone]   Fax:  [phone]  Physical Therapy Treatment  Patient Details  Name: Johnathan Ross MRN: [id number] Date of Birth: 08/22/1970 Referring Provider (PT): Carylon Perches, MD   Encounter Date: 09/11/2019  PT End of Session - 09/11/19 1146    Visit Number  91    Number of Visits  9    Date for PT Re-Evaluation  10/02/19    Authorization Type  Medicare Part A and B; 10th visit PN    Progress Note Due on Visit  38    PT Start Time  0847    PT Stop Time  0930    PT Time Calculation (min)  43 min    Activity Tolerance  Patient tolerated treatment well;No increased pain   limited by spasticity   Behavior During Therapy  [facility] for tasks assessed/performed       Past Medical History:  Diagnosis Date  . Hyperlipidemia   . Hypertension   . Hypogonadism in male 11/01/2016  . Hypothyroidism   . Multiple sclerosis ([place])   . Proliferative diabetic retinopathy(362.02)   . Type 1 diabetes mellitus ([place]) dx'd 1994  . Vitamin D insufficiency 11/29/2016    Past Surgical History:  Procedure Laterality Date  . EYE SURGERY Bilateral    "laser for diabetic retinopathy"  . FRACTURE SURGERY    . OPEN REDUCTION INTERNAL FIXATION (ORIF) TIBIA/FIBULA FRACTURE Right 10/30/2016  . ORIF ANKLE FRACTURE Left 2015  . ORIF TIBIA FRACTURE Right 10/30/2016   Procedure: OPEN REDUCTION INTERNAL FIXATION (ORIF) TIBIA FIBULA FRACTURE;  Surgeon: Altamese Garfield, MD;  Location: [facility];  Service: Orthopedics;  Laterality: Right;  . RETINAL LASER PROCEDURE Bilateral    "for diabetic retinopathy"    There were no vitals filed for this visit.  Subjective Assessment - 09/11/19 0854    Subjective  Has been doing a lot of mowing.  Still getting sleepy from baclofen.  Knees still locking out.    Patient is accompained by:  Family member   mom in car   Pertinent History  multiple  falls, vitamin D insufficiency, Type 1 IDDM, diabetic retinopathy, hypothyroidism, HTN, hyperlipidemia, R tibia fracture, L ankle fracture with ORIF    Limitations  Walking    Diagnostic tests  No new lesions seen on MRI    Patient Stated Goals  To improve walking    Currently in Pain?  No/denies                       [facility] Adult PT Treatment/Exercise - 09/11/19 1137      Ambulation/Gait   Ambulation/Gait  Yes    Ambulation/Gait Assistance  1: +2 Total assist    Ambulation/Gait Assistance Details  +2 for safety while performing gait training with quad tip cane; PT performed facilitation at trunk for controlled weight shifting and trunk control while PTA provided balance assistance to prevent anterior or L lateral LOB.  PT also assisted with preventing genu recurvatum in stance phase to allow pt improved activation and intiation of hip and knee flexion for swing phase.    Ambulation Distance (Feet)  50 Feet    Assistive device  Straight cane    Gait Pattern  Step-to pattern;Step-through pattern;Decreased step length - right;Decreased step length - left;Decreased stride length;Decreased hip/knee flexion - right;Decreased hip/knee flexion - left;Decreased dorsiflexion - right;Decreased dorsiflexion - left;Right genu recurvatum;Left genu recurvatum;Lateral trunk lean  to left;Narrow base of support;Poor foot clearance - left;Poor foot clearance - right    Ambulation Surface  Level;Indoor      Knee/Hip Exercises: Sidelying   Hip ABduction  Strengthening;Right;Left;2 sets;5 reps    Hip ABduction Limitations  open chain with lower leg performing stabilization; able to perform today with knee extended    Other Sidelying Knee/Hip Exercises  Following open chain hip ABD performed isometric ABD with hip and knee flexion x 2 sets x 5 reps R and L side      Knee/Hip Exercises: Prone   Hamstring Curl  2 sets;10 reps    Hamstring Curl Limitations  R and LLE; against resistance of red  theraband    Hip Extension  AAROM;Strengthening;Right;Left;2 sets;5 reps    Hip Extension Limitations  with knee flexed    Other Prone Exercises  quad and hip flexor stretch/PROM with therapist performing on R and LLE x 90 seconds each               PT Short Term Goals - 09/04/19 1500      PT SHORT TERM GOAL #1   Title  Patient will demonstrate independence with updated HEP    Baseline  09/03/19: met with current program    Status  Achieved      PT SHORT TERM GOAL #2   Title  Pt will improve gait velocity with RW to >/= 0.6 ft/sec with decreased use of UE to lift and swing RLE through    Baseline  09/03/19: 0.70 ft/sec with RW and AFO with decreased use of UE's to lift/swing through    Status  Achieved      PT SHORT TERM GOAL #3   Title  Pt will improve BERG to >/= 30/56    Baseline  09/03/19: 38/56 scored today    Status  Achieved      PT SHORT TERM GOAL #4   Title  Pt will ambulate x 100' over indoor surfaces with RW and AFO MOD I with 2 standing rest breaks and improved R hip and knee flexion and foot clearance without lifting through UE    Baseline  09/03/19: met distance as stated with 4 brief ( 3-5 sec's) rest breaks this session, improved just not to goal    Status  Partially Met    Target Date  09/03/19      PT SHORT TERM GOAL #5   Title  Pt will negotate 8 stairs with two rails step to sequence with supervision    Baseline  09/04/19: pt using bil rails and reciprocal pattern with extensor tone locking out left LE, supervision assist only.    Status  Achieved        PT Long Term Goals - 07/04/19 1225      PT LONG TERM GOAL #1   Title  Pt will demonstrate independence with land HEP    Time  12    Period  Weeks    Status  Revised    Target Date  10/02/19      PT LONG TERM GOAL #2   Title  Pt will improve gait velocity with RW to >/= 1.8 ft/sec    Time  12    Period  Weeks    Status  Revised    Target Date  10/02/19      PT LONG TERM GOAL #3   Title  Pt will  improve BERG to >/= 38/56 to decrease falls risk    Time  12    Period  Weeks    Status  Revised    Target Date  10/02/19      PT LONG TERM GOAL #4   Title  Pt will ambulate x 200' outside over pavement and grass with supervision and LRAD to improve safety ambulating to wood working shop at home    Time  12    Period  Weeks    Status  Revised    Target Date  10/02/19      PT LONG TERM GOAL #5   Title  Pt will negotiate 8 stairs with 2 rails with alternating sequence with appropriate AFO/Bioness with supervision    Time  12    Period  Weeks    Status  Revised    Target Date  10/02/19            Plan - 09/11/19 1146    Clinical Impression Statement  Pt demonstrating improved strength in hamstring and glute medius muscle groups bilaterally as evidence by ability to perform exercises against resistance and against gravity without compensatory knee flexion.  Pt also demonstrated decreased resistance and spasticity.  Re-attempted gait with cane - required +2 but pt able to progress to 47' today.  Will continue to address and progress towards LTG.    Comorbidities  multiple falls, vitamin D insufficiency, Type 1 IDDM, diabetic retinopathy, hypothyroidism, HTN, hyperlipidemia, R tibia fracture, L ankle fracture with ORIF    Rehab Potential  Fair    PT Frequency  2x / week    PT Duration  12 weeks    PT Treatment/Interventions  ADLs/Self Care Home Management;Aquatic Therapy;Electrical Stimulation;DME Instruction;Gait training;Stair training;Functional mobility training;Therapeutic activities;Therapeutic exercise;Balance training;Neuromuscular re-education;Patient/family education;Orthotic Fit/Training;Passive range of motion;Energy conservation    PT Next Visit Plan  Gait with cane if have +2; Really needs to work on hip ABD strengthening open and closed chain!  Standing resisted hip/knee flexion, walking with resistance?  Focus on gentle activation of hip and knee flexion in  supine/standing/gait to avoid overflow to extensors, gait at counter with cane.  Quadruped or seated core strengthening.  Continue to focus on core/postural control training without UE support in standing, hip flexion and knee flexion strengthening, SLS and weight shift training (rockerboard). tall kneeling for proximal strengthening    PT Home Exercise Plan  Revised HEP on 3/18    Consulted and Agree with Plan of Care  Patient       Patient will benefit from skilled therapeutic intervention in order to improve the following deficits and impairments:  Abnormal gait, Decreased balance, Decreased endurance, Decreased coordination, Decreased range of motion, Decreased strength, Difficulty walking, Impaired tone, Postural dysfunction, Decreased activity tolerance, Decreased mobility  Visit Diagnosis: Other abnormalities of gait and mobility  Muscle weakness (generalized)  Unsteadiness on feet  Difficulty in walking, not elsewhere classified  Foot drop, right  Foot drop, left  Repeated falls     Problem List Patient Active Problem List   Diagnosis Date Noted  . Gait disturbance 08/19/2019  . Acquired diplegia ([place]) 08/19/2019  . Vitamin D insufficiency 11/29/2016  . Hypogonadism in male 11/01/2016  . 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[place])   . Hypothyroidism   . Hypertension   . Hyperlipidemia   . Closed fracture of right fibula and tibia 10/29/2016  . Closed tibia fracture 10/29/2016  . Closed fracture of right tibial plafond with fibula involvement 10/29/2016  . Abnormal liver function tests 03/06/2013  . RASH AND OTHER NONSPECIFIC  SKIN ERUPTION 10/07/2009  . SHOULDER PAIN, LEFT 12/27/2008  . Type I (juvenile type) diabetes mellitus without mention of complication, uncontrolled 12/03/2007  . HYPOTHYROIDISM 11/12/2007  . HYPERCHOLESTEROLEMIA 11/12/2007  . MULTIPLE SCLEROSIS 08/29/2007  . PROLIFERATIVE DIABETIC RETINOPATHY 08/29/2007  . HYPERTENSION  12/25/2006    [name], PT, DPT 09/11/19    11:49 AM    [facility] [address] [place], [place], [address] Phone: [phone]   Fax:  [phone]  Name: JAHVON [facility]OSLINE MRN: [id number] Date of Birth: Sep 23, 1970

## 2019-09-16 ENCOUNTER — Ambulatory Visit: Payer: Medicare Other | Admitting: Physical Therapy

## 2019-09-16 ENCOUNTER — Encounter: Payer: Self-pay | Admitting: Physical Therapy

## 2019-09-16 ENCOUNTER — Other Ambulatory Visit: Payer: Self-pay

## 2019-09-16 ENCOUNTER — Other Ambulatory Visit: Payer: Self-pay | Admitting: Endocrinology

## 2019-09-16 DIAGNOSIS — R296 Repeated falls: Secondary | ICD-10-CM | POA: Diagnosis not present

## 2019-09-16 DIAGNOSIS — R2681 Unsteadiness on feet: Secondary | ICD-10-CM

## 2019-09-16 DIAGNOSIS — M6281 Muscle weakness (generalized): Secondary | ICD-10-CM

## 2019-09-16 DIAGNOSIS — M21371 Foot drop, right foot: Secondary | ICD-10-CM | POA: Diagnosis not present

## 2019-09-16 DIAGNOSIS — R262 Difficulty in walking, not elsewhere classified: Secondary | ICD-10-CM

## 2019-09-16 DIAGNOSIS — R2689 Other abnormalities of gait and mobility: Secondary | ICD-10-CM | POA: Diagnosis not present

## 2019-09-16 NOTE — Therapy (Signed)
[facility] [address] [place], [place], [address] Phone: [phone]   Fax:  [phone]  Physical Therapy Treatment  Patient Details  Name: Johnathan Ross MRN: [id number] Date of Birth: 07-21-1970 Referring Provider (PT): Carylon Perches, MD   Encounter Date: 09/16/2019  PT End of Session - 09/16/19 0853    Visit Number  48    Number of Visits  25    Date for PT Re-Evaluation  10/02/19    Authorization Type  Medicare Part A and B; 10th visit PN    Progress Note Due on Visit  58    PT Start Time  0847    PT Stop Time  0930    PT Time Calculation (min)  43 min    Equipment Utilized During Treatment  Gait belt    Activity Tolerance  Patient tolerated treatment well;No increased pain;Patient limited by fatigue   limited by spasticity   Behavior During Therapy  [facility] for tasks assessed/performed       Past Medical History:  Diagnosis Date  . Hyperlipidemia   . Hypertension   . Hypogonadism in male 11/01/2016  . Hypothyroidism   . Multiple sclerosis ([place])   . Proliferative diabetic retinopathy(362.02)   . Type 1 diabetes mellitus ([place]) dx'd 1994  . Vitamin D insufficiency 11/29/2016    Past Surgical History:  Procedure Laterality Date  . EYE SURGERY Bilateral    "laser for diabetic retinopathy"  . FRACTURE SURGERY    . OPEN REDUCTION INTERNAL FIXATION (ORIF) TIBIA/FIBULA FRACTURE Right 10/30/2016  . ORIF ANKLE FRACTURE Left 2015  . ORIF TIBIA FRACTURE Right 10/30/2016   Procedure: OPEN REDUCTION INTERNAL FIXATION (ORIF) TIBIA FIBULA FRACTURE;  Surgeon: Altamese Osburn, MD;  Location: [facility];  Service: Orthopedics;  Laterality: Right;  . RETINAL LASER PROCEDURE Bilateral    "for diabetic retinopathy"    There were no vitals filed for this visit.  Subjective Assessment - 09/16/19 0852    Subjective  No new complaints. No falls or pain to report.    Patient is accompained by:  Family member   mom in car   Pertinent History  multiple falls, vitamin D insufficiency, Type 1 IDDM, diabetic retinopathy, hypothyroidism, HTN, hyperlipidemia, R tibia fracture, L ankle fracture with ORIF    Diagnostic tests  No new lesions seen on MRI    Patient Stated Goals  To improve walking    Currently in Pain?  No/denies          [facility] Adult PT Treatment/Exercise - 09/16/19 0854      Transfers   Transfers  Sit to Stand;Stand to Sit    Sit to Stand  5: Supervision;With upper extremity assist;From bed;From chair/3-in-1    Stand to Sit  5: Supervision;With upper extremity assist;To bed;To chair/3-in-1      Ambulation/Gait   Ambulation/Gait  Yes    Ambulation/Gait Assistance  5: Supervision    Ambulation/Gait Assistance Details  reminder cues for relaxed UE's, to keep knee soft and to initiated at hip    Ambulation Distance (Feet)  90 Feet   x2   Assistive device  Rolling walker    Gait Pattern  Step-to pattern;Step-through pattern;Decreased step length - right;Decreased step length - left;Decreased stride length;Decreased hip/knee flexion - right;Decreased hip/knee flexion - left;Decreased dorsiflexion - right;Decreased dorsiflexion - left;Right genu recurvatum;Left genu recurvatum;Lateral trunk lean to left;Narrow base of support;Poor foot clearance - left;Poor foot clearance - right    Ambulation Surface  Level;Indoor  Neuro Re-ed    Neuro Re-ed Details   for strengthening/muscle re-ed: tall kneeling on red mat, intermittent touch to mat table for balance- with green band resistance at front of pelvis for mini squats for 10 reps in midline, then 10 reps laterally toward each side. rest breaks as needed between reps/sets. then with sustained pull of green band at lateral pelvis (performed on both sides) for arms at sides while pt moved head left<>right, then up<>down for 6-7 reps each. frequent need for UE touch/support on mat for balance.        Knee/Hip Exercises: Prone   Hamstring Curl  1 set;10  reps;Limitations    Hamstring Curl Limitations  R and LLE; against resistance of red theraband. assist needed at times to initiate on right due to quad/hip tone, after hip/quad stretching pt able to self perform up to 3 reps before needing assistance again     Other Prone Exercises  quad and hip flexor stretch/PROM with therapist performing on R and LLE x 90 seconds each for 2 reps each. pt shown how to use strap to perform stretch at home               PT Short Term Goals - 09/04/19 1500      PT SHORT TERM GOAL #1   Title  Patient will demonstrate independence with updated HEP    Baseline  09/03/19: met with current program    Status  Achieved      PT SHORT TERM GOAL #2   Title  Pt will improve gait velocity with RW to >/= 0.6 ft/sec with decreased use of UE to lift and swing RLE through    Baseline  09/03/19: 0.70 ft/sec with RW and AFO with decreased use of UE's to lift/swing through    Status  Achieved      PT SHORT TERM GOAL #3   Title  Pt will improve BERG to >/= 30/56    Baseline  09/03/19: 38/56 scored today    Status  Achieved      PT SHORT TERM GOAL #4   Title  Pt will ambulate x 100' over indoor surfaces with RW and AFO MOD I with 2 standing rest breaks and improved R hip and knee flexion and foot clearance without lifting through UE    Baseline  09/03/19: met distance as stated with 4 brief ( 3-5 sec's) rest breaks this session, improved just not to goal    Status  Partially Met    Target Date  09/03/19      PT SHORT TERM GOAL #5   Title  Pt will negotate 8 stairs with two rails step to sequence with supervision    Baseline  09/04/19: pt using bil rails and reciprocal pattern with extensor tone locking out left LE, supervision assist only.    Status  Achieved        PT Long Term Goals - 07/04/19 1225      PT LONG TERM GOAL #1   Title  Pt will demonstrate independence with land HEP    Time  12    Period  Weeks    Status  Revised    Target Date  10/02/19      PT  LONG TERM GOAL #2   Title  Pt will improve gait velocity with RW to >/= 1.8 ft/sec    Time  12    Period  Weeks    Status  Revised    Target Date  10/02/19      PT LONG TERM GOAL #3   Title  Pt will improve BERG to >/= 38/56 to decrease falls risk    Time  12    Period  Weeks    Status  Revised    Target Date  10/02/19      PT LONG TERM GOAL #4   Title  Pt will ambulate x 200' outside over pavement and grass with supervision and LRAD to improve safety ambulating to wood working shop at home    Time  12    Period  Weeks    Status  Revised    Target Date  10/02/19      PT LONG TERM GOAL #5   Title  Pt will negotiate 8 stairs with 2 rails with alternating sequence with appropriate AFO/Bioness with supervision    Time  12    Period  Weeks    Status  Revised    Target Date  10/02/19            Plan - 09/16/19 0853    Clinical Impression Statement  Today's skilled session continued to focus on strengthening and gait mechanics with rest breaks needed due to fatigue. The pt is progressing goals and should benefit from continued PT to progress toward unmet goals.    Comorbidities  multiple falls, vitamin D insufficiency, Type 1 IDDM, diabetic retinopathy, hypothyroidism, HTN, hyperlipidemia, R tibia fracture, L ankle fracture with ORIF    Rehab Potential  Fair    PT Frequency  2x / week    PT Duration  12 weeks    PT Treatment/Interventions  ADLs/Self Care Home Management;Aquatic Therapy;Electrical Stimulation;DME Instruction;Gait training;Stair training;Functional mobility training;Therapeutic activities;Therapeutic exercise;Balance training;Neuromuscular re-education;Patient/family education;Orthotic Fit/Training;Passive range of motion;Energy conservation    PT Next Visit Plan  Gait with cane if have +2; Really needs to work on hip ABD strengthening open and closed chain!  Standing resisted hip/knee flexion, walking with resistance?  Focus on gentle activation of hip and knee  flexion in supine/standing/gait to avoid overflow to extensors, gait at counter with cane.  Quadruped or seated core strengthening.  Continue to focus on core/postural control training without UE support in standing, hip flexion and knee flexion strengthening, SLS and weight shift training (rockerboard). tall kneeling for proximal strengthening    PT Home Exercise Plan  Revised HEP on 3/18    Consulted and Agree with Plan of Care  Patient       Patient will benefit from skilled therapeutic intervention in order to improve the following deficits and impairments:  Abnormal gait, Decreased balance, Decreased endurance, Decreased coordination, Decreased range of motion, Decreased strength, Difficulty walking, Impaired tone, Postural dysfunction, Decreased activity tolerance, Decreased mobility  Visit Diagnosis: Other abnormalities of gait and mobility  Muscle weakness (generalized)  Unsteadiness on feet  Difficulty in walking, not elsewhere classified     Problem List Patient Active Problem List   Diagnosis Date Noted  . Gait disturbance 08/19/2019  . Acquired diplegia ([place]) 08/19/2019  . Vitamin D insufficiency 11/29/2016  . Hypogonadism in male 11/01/2016  . 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[place])   . Hypothyroidism   . Hypertension   . Hyperlipidemia   . Closed fracture of right fibula and tibia 10/29/2016  . Closed tibia fracture 10/29/2016  . Closed fracture of right tibial plafond with fibula involvement 10/29/2016  . Abnormal liver function tests 03/06/2013  . RASH AND OTHER NONSPECIFIC SKIN ERUPTION 10/07/2009  . SHOULDER PAIN,  LEFT 12/27/2008  . Type I (juvenile type) diabetes mellitus without mention of complication, uncontrolled 12/03/2007  . HYPOTHYROIDISM 11/12/2007  . HYPERCHOLESTEROLEMIA 11/12/2007  . MULTIPLE SCLEROSIS 08/29/2007  . PROLIFERATIVE DIABETIC RETINOPATHY 08/29/2007  . HYPERTENSION 12/25/2006    Willow Ora, PTA, [facility] [address] [place], [place] [address] [phone] 09/16/19, 4:12 PM   Name: KYLEY LAUREL MRN: [id number] Date of Birth: 04-Dec-1970

## 2019-09-18 ENCOUNTER — Other Ambulatory Visit: Payer: Self-pay

## 2019-09-18 ENCOUNTER — Ambulatory Visit: Payer: Medicare Other | Admitting: Physical Therapy

## 2019-09-18 ENCOUNTER — Encounter: Payer: Self-pay | Admitting: Physical Therapy

## 2019-09-18 DIAGNOSIS — R2689 Other abnormalities of gait and mobility: Secondary | ICD-10-CM | POA: Diagnosis not present

## 2019-09-18 DIAGNOSIS — R262 Difficulty in walking, not elsewhere classified: Secondary | ICD-10-CM | POA: Diagnosis not present

## 2019-09-18 DIAGNOSIS — R2681 Unsteadiness on feet: Secondary | ICD-10-CM

## 2019-09-18 DIAGNOSIS — M21371 Foot drop, right foot: Secondary | ICD-10-CM | POA: Diagnosis not present

## 2019-09-18 DIAGNOSIS — R296 Repeated falls: Secondary | ICD-10-CM | POA: Diagnosis not present

## 2019-09-18 DIAGNOSIS — M6281 Muscle weakness (generalized): Secondary | ICD-10-CM

## 2019-09-19 NOTE — Therapy (Signed)
[facility] [address] [place], [place], [address] Phone: [phone]   Fax:  [phone]  Physical Therapy Treatment  Patient Details  Name: Johnathan Ross MRN: [id number] Date of Birth: [birth year] Referring Provider (PT): Carylon Perches, MD   Encounter Date: 09/18/2019  PT End of Session - 09/18/19 0852    Visit Number  4    Number of Visits  91    Date for PT Re-Evaluation  10/02/19    Authorization Type  Medicare Part A and B; 10th visit PN    Progress Note Due on Visit  23    PT Start Time  0847    PT Stop Time  0930    PT Time Calculation (min)  43 min    Equipment Utilized During Treatment  Gait belt    Activity Tolerance  Patient tolerated treatment well;No increased pain;Patient limited by fatigue   limited by spasticity   Behavior During Therapy  [facility] for tasks assessed/performed       Past Medical History:  Diagnosis Date  . Hyperlipidemia   . Hypertension   . Hypogonadism in male 11/01/2016  . Hypothyroidism   . Multiple sclerosis ([place])   . Proliferative diabetic retinopathy(362.02)   . Type 1 diabetes mellitus ([place]) dx'd 1994  . Vitamin D insufficiency 11/29/2016    Past Surgical History:  Procedure Laterality Date  . EYE SURGERY Bilateral    "laser for diabetic retinopathy"  . FRACTURE SURGERY    . OPEN REDUCTION INTERNAL FIXATION (ORIF) TIBIA/FIBULA FRACTURE Right 10/30/2016  . ORIF ANKLE FRACTURE Left 2015  . ORIF TIBIA FRACTURE Right 10/30/2016   Procedure: OPEN REDUCTION INTERNAL FIXATION (ORIF) TIBIA FIBULA FRACTURE;  Surgeon: Altamese [place]ville, MD;  Location: [facility];  Service: Orthopedics;  Laterality: Right;  . RETINAL LASER PROCEDURE Bilateral    "for diabetic retinopathy"    There were no vitals filed for this visit.  Subjective Assessment - 09/18/19 0851    Subjective  No new complaints. No falls or pain to report.    Patient is accompained by:  Family member   mom in car    Pertinent History  multiple falls, vitamin D insufficiency, Type 1 IDDM, diabetic retinopathy, hypothyroidism, HTN, hyperlipidemia, R tibia fracture, L ankle fracture with ORIF    Diagnostic tests  No new lesions seen on MRI    Patient Stated Goals  To improve walking    Currently in Pain?  No/denies    Pain Score  0-No pain            [facility] Adult PT Treatment/Exercise - 09/18/19 0852      Transfers   Transfers  Sit to Stand;Stand to Sit    Sit to Stand  5: Supervision;With upper extremity assist;From bed;From chair/3-in-1    Stand to Sit  5: Supervision;With upper extremity assist;To bed;To chair/3-in-1      Ambulation/Gait   Ambulation/Gait  Yes    Ambulation/Gait Assistance  5: Supervision;4: Min guard    Ambulation/Gait Assistance Details  min guard with resisted gait (PTA holding green band at pelvis to provide resistance)- no locking out of either LE noted, pt did need 3 standing rest breaks due to fatigue.     Ambulation Distance (Feet)  50 Feet   x2   Assistive device  Rolling walker    Gait Pattern  Step-to pattern;Step-through pattern;Decreased step length - right;Decreased step length - left;Decreased stride length;Decreased hip/knee flexion - right;Decreased hip/knee flexion - left;Decreased dorsiflexion -  right;Decreased dorsiflexion - left;Right genu recurvatum;Left genu recurvatum;Lateral trunk lean to left;Narrow base of support;Poor foot clearance - left;Poor foot clearance - right    Ambulation Surface  Level;Indoor      Neuro Re-ed    Neuro Re-ed Details   for strengthening/muscle re-ed: in parallel bars- LE BOSU pushing laterally along the parallel bars for 3 laps each way, then fwd pushing alternating LE's for 4 laps across parallel bars; with green band reisistance- side stepping in squat position for 3 laps each way, then fwd/bwd walking for 3 laps each way, light UE support on bars; standing next to mat table with sustained pull with green band in lateral  direction- alternating UE raises from walker support for 5 reps each side, then with no UE support head movements left<>right, then up<>down for 5 reps each. occasional touch to walker needed for balance recovery.            PT Short Term Goals - 09/04/19 1500      PT SHORT TERM GOAL #1   Title  Patient will demonstrate independence with updated HEP    Baseline  09/03/19: met with current program    Status  Achieved      PT SHORT TERM GOAL #2   Title  Pt will improve gait velocity with RW to >/= 0.6 ft/sec with decreased use of UE to lift and swing RLE through    Baseline  09/03/19: 0.70 ft/sec with RW and AFO with decreased use of UE's to lift/swing through    Status  Achieved      PT SHORT TERM GOAL #3   Title  Pt will improve BERG to >/= 30/56    Baseline  09/03/19: 38/56 scored today    Status  Achieved      PT SHORT TERM GOAL #4   Title  Pt will ambulate x 100' over indoor surfaces with RW and AFO MOD I with 2 standing rest breaks and improved R hip and knee flexion and foot clearance without lifting through UE    Baseline  09/03/19: met distance as stated with 4 brief ( 3-5 sec's) rest breaks this session, improved just not to goal    Status  Partially Met    Target Date  09/03/19      PT SHORT TERM GOAL #5   Title  Pt will negotate 8 stairs with two rails step to sequence with supervision    Baseline  09/04/19: pt using bil rails and reciprocal pattern with extensor tone locking out left LE, supervision assist only.    Status  Achieved        PT Long Term Goals - 07/04/19 1225      PT LONG TERM GOAL #1   Title  Pt will demonstrate independence with land HEP    Time  12    Period  Weeks    Status  Revised    Target Date  10/02/19      PT LONG TERM GOAL #2   Title  Pt will improve gait velocity with RW to >/= 1.8 ft/sec    Time  12    Period  Weeks    Status  Revised    Target Date  10/02/19      PT LONG TERM GOAL #3   Title  Pt will improve BERG to >/= 38/56 to  decrease falls risk    Time  12    Period  Weeks    Status  Revised  Target Date  10/02/19      PT LONG TERM GOAL #4   Title  Pt will ambulate x 200' outside over pavement and grass with supervision and LRAD to improve safety ambulating to wood working shop at home    Time  12    Period  Weeks    Status  Revised    Target Date  10/02/19      PT LONG TERM GOAL #5   Title  Pt will negotiate 8 stairs with 2 rails with alternating sequence with appropriate AFO/Bioness with supervision    Time  12    Period  Weeks    Status  Revised    Target Date  10/02/19            Plan - 09/18/19 0852    Clinical Impression Statement  Today's skilled session focued on hip/LE/core strengthening and gait mechanics with resistance. Pt demo's improved swing through with bil UE's with use of resistance today. The pt is progressing toward goals and should benefit from continued PT to progress toward unmet goals.    Comorbidities  multiple falls, vitamin D insufficiency, Type 1 IDDM, diabetic retinopathy, hypothyroidism, HTN, hyperlipidemia, R tibia fracture, L ankle fracture with ORIF    Rehab Potential  Fair    PT Frequency  2x / week    PT Duration  12 weeks    PT Treatment/Interventions  ADLs/Self Care Home Management;Aquatic Therapy;Electrical Stimulation;DME Instruction;Gait training;Stair training;Functional mobility training;Therapeutic activities;Therapeutic exercise;Balance training;Neuromuscular re-education;Patient/family education;Orthotic Fit/Training;Passive range of motion;Energy conservation    PT Next Visit Plan  Gait with cane if have +2; Really needs to work on hip ABD strengthening open and closed chain!  Standing resisted hip/knee flexion, walking with resistance,  Focus on gentle activation of hip and knee flexion in supine/standing/gait to avoid overflow to extensors, gait at counter with cane.  Quadruped or seated core strengthening.  Continue to focus on core/postural control  training without UE support in standing, hip flexion and knee flexion strengthening, SLS and weight shift training (rockerboard). tall kneeling for proximal strengthening    PT Home Exercise Plan  Revised HEP on 3/18    Consulted and Agree with Plan of Care  Patient       Patient will benefit from skilled therapeutic intervention in order to improve the following deficits and impairments:  Abnormal gait, Decreased balance, Decreased endurance, Decreased coordination, Decreased range of motion, Decreased strength, Difficulty walking, Impaired tone, Postural dysfunction, Decreased activity tolerance, Decreased mobility  Visit Diagnosis: Other abnormalities of gait and mobility  Muscle weakness (generalized)  Unsteadiness on feet  Difficulty in walking, not elsewhere classified     Problem List Patient Active Problem List   Diagnosis Date Noted  . Gait disturbance 08/19/2019  . Acquired diplegia ([place]) 08/19/2019  . Vitamin D insufficiency 11/29/2016  . Hypogonadism in male 11/01/2016  . 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 Hypothyroidism   . Hypertension   . Hyperlipidemia   . Closed fracture of right fibula and tibia 10/29/2016  . Closed tibia fracture 10/29/2016  . Closed fracture of right tibial plafond with fibula involvement 10/29/2016  . Abnormal liver function tests 03/06/2013  . RASH AND OTHER NONSPECIFIC SKIN ERUPTION 10/07/2009  . SHOULDER PAIN, LEFT 12/27/2008  . Type I (juvenile type) diabetes mellitus without mention of complication, uncontrolled 12/03/2007  . HYPOTHYROIDISM 11/12/2007  . HYPERCHOLESTEROLEMIA 11/12/2007  . MULTIPLE SCLEROSIS 08/29/2007  . PROLIFERATIVE DIABETIC RETINOPATHY 08/29/2007  . HYPERTENSION 12/25/2006  Willow Ora, PTA, [facility] [address], [place], [place] [address] [phone] 09/19/19, 12:35 PM   Name: Johnathan Ross MRN: [id number] Date of Birth:  Jun 30, 1970

## 2019-09-23 ENCOUNTER — Encounter: Payer: Self-pay | Admitting: Physical Therapy

## 2019-09-23 ENCOUNTER — Other Ambulatory Visit: Payer: Self-pay

## 2019-09-23 ENCOUNTER — Ambulatory Visit: Payer: Medicare Other | Admitting: Physical Therapy

## 2019-09-23 DIAGNOSIS — M21371 Foot drop, right foot: Secondary | ICD-10-CM

## 2019-09-23 DIAGNOSIS — R296 Repeated falls: Secondary | ICD-10-CM | POA: Diagnosis not present

## 2019-09-23 DIAGNOSIS — R2681 Unsteadiness on feet: Secondary | ICD-10-CM

## 2019-09-23 DIAGNOSIS — R262 Difficulty in walking, not elsewhere classified: Secondary | ICD-10-CM

## 2019-09-23 DIAGNOSIS — M21372 Foot drop, left foot: Secondary | ICD-10-CM

## 2019-09-23 DIAGNOSIS — R2689 Other abnormalities of gait and mobility: Secondary | ICD-10-CM

## 2019-09-23 DIAGNOSIS — M6281 Muscle weakness (generalized): Secondary | ICD-10-CM | POA: Diagnosis not present

## 2019-09-23 NOTE — Therapy (Addendum)
[facility] [address] [place], [place], [address] Phone: [phone]   Fax:  [phone]  Physical Therapy Treatment and 10th Visit Progress Note  Patient Details  Name: Johnathan Ross MRN: [id number] Date of Birth: 10/13/1970 Referring Provider (PT): Carylon Perches, MD   Encounter Date: 09/23/2019   Progress Note  Reporting Period 08/20/2019 to 09/23/19  See note below for Objective Data and Assessment of Progress/Goals.    PT End of Session - 09/23/19 0933    Visit Number  50    Number of Visits  6    Date for PT Re-Evaluation  10/02/19    Authorization Type  Medicare Part A and B; 10th visit PN    Progress Note Due on Visit  92    PT Start Time  0850    PT Stop Time  0932    PT Time Calculation (min)  42 min    Activity Tolerance  Patient tolerated treatment well;No increased pain;Patient limited by fatigue   limited by spasticity   Behavior During Therapy  [facility] for tasks assessed/performed       Past Medical History:  Diagnosis Date  . Hyperlipidemia   . Hypertension   . Hypogonadism in male 11/01/2016  . Hypothyroidism   . Multiple sclerosis ([place])   . Proliferative diabetic retinopathy(362.02)   . Type 1 diabetes mellitus ([place]) dx'd 1994  . Vitamin D insufficiency 11/29/2016    Past Surgical History:  Procedure Laterality Date  . EYE SURGERY Bilateral    "laser for diabetic retinopathy"  . FRACTURE SURGERY    . OPEN REDUCTION INTERNAL FIXATION (ORIF) TIBIA/FIBULA FRACTURE Right 10/30/2016  . ORIF ANKLE FRACTURE Left 2015  . ORIF TIBIA FRACTURE Right 10/30/2016   Procedure: OPEN REDUCTION INTERNAL FIXATION (ORIF) TIBIA FIBULA FRACTURE;  Surgeon: Altamese Parks, MD;  Location: [facility];  Service: Orthopedics;  Laterality: Right;  . RETINAL LASER PROCEDURE Bilateral    "for diabetic retinopathy"    There were no vitals filed for this visit.  Subjective Assessment - 09/23/19 0857    Subjective   Pt reports he was able to go up his stairs today alternating sequence without using his hands to lift his legs.    Patient is accompained by:  Family member   mom in car   Pertinent History  multiple falls, vitamin D insufficiency, Type 1 IDDM, diabetic retinopathy, hypothyroidism, HTN, hyperlipidemia, R tibia fracture, L ankle fracture with ORIF    Diagnostic tests  No new lesions seen on MRI    Patient Stated Goals  To improve walking    Currently in Pain?  No/denies                       [facility] Adult PT Treatment/Exercise - 09/23/19 0910      Transfers   Transfers  Sit to Stand;Stand to Sit    Sit to Stand  4: Min assist    Sit to Stand Details (indicate cue type and reason)  from perched sitting on mat without use of UE, standing on foam    Stand to Sit  4: Min assist    Stand to Sit Details  standing on foam without use of UE when transitioning back to sitting      Ambulation/Gait   Ambulation/Gait  Yes    Ambulation/Gait Assistance  4: Min assist    Ambulation/Gait Assistance Details  performed gait with cane at end of session with therapist  on R side and tech providing supervision on L side.  Pt demonstrated improved activation of hip and knee flexion/swing phase with each LE, decreased genu recurvatum during initial stance until pt fatigued and improved upright trunk and control of forward momentum.  As pt fatigued therapist provided facilitation at trunk and pelvis for lateral and anterior weight shift to stance LE to allow R tibia to advance over R foot during mid stance.  One standing rest break; decreased assistance required overall    Ambulation Distance (Feet)  50 Feet    Assistive device  Straight cane    Gait Pattern  Step-through pattern;Decreased step length - left;Decreased stance time - right;Decreased stride length;Decreased hip/knee flexion - right;Decreased dorsiflexion - right;Right genu recurvatum;Poor foot clearance - right    Ambulation Surface   Level;Indoor    Pre-Gait Activities  Sit <> stand while standing on solid surface without UE support and transitioning to stepping forwards <> backwards with RLE and then LLE with UE support on cane x 5 reps each side.      Knee/Hip Exercises: Seated   Sit to Sand  1 set;10 reps;without UE support   standing on foam, perched sitting     Knee/Hip Exercises: Sidelying   Hip ABduction  Strengthening;Right;Left;3 sets;10 reps    Hip ABduction Limitations  open chain with bottom leg bent, top leg straight, isometric ABD with hip/knee flexion<>extension, both legs straight for increased core activation.               PT Short Term Goals - 09/04/19 1500      PT SHORT TERM GOAL #1   Title  Patient will demonstrate independence with updated HEP    Baseline  09/03/19: met with current program    Status  Achieved      PT SHORT TERM GOAL #2   Title  Pt will improve gait velocity with RW to >/= 0.6 ft/sec with decreased use of UE to lift and swing RLE through    Baseline  09/03/19: 0.70 ft/sec with RW and AFO with decreased use of UE's to lift/swing through    Status  Achieved      PT SHORT TERM GOAL #3   Title  Pt will improve BERG to >/= 30/56    Baseline  09/03/19: 38/56 scored today    Status  Achieved      PT SHORT TERM GOAL #4   Title  Pt will ambulate x 100' over indoor surfaces with RW and AFO MOD I with 2 standing rest breaks and improved R hip and knee flexion and foot clearance without lifting through UE    Baseline  09/03/19: met distance as stated with 4 brief ( 3-5 sec's) rest breaks this session, improved just not to goal    Status  Partially Met    Target Date  09/03/19      PT SHORT TERM GOAL #5   Title  Pt will negotate 8 stairs with two rails step to sequence with supervision    Baseline  09/04/19: pt using bil rails and reciprocal pattern with extensor tone locking out left LE, supervision assist only.    Status  Achieved        PT Long Term Goals - 07/04/19 1225       PT LONG TERM GOAL #1   Title  Pt will demonstrate independence with land HEP    Time  12    Period  Weeks    Status  Revised  Target Date  10/02/19      PT LONG TERM GOAL #2   Title  Pt will improve gait velocity with RW to >/= 1.8 ft/sec    Time  12    Period  Weeks    Status  Revised    Target Date  10/02/19      PT LONG TERM GOAL #3   Title  Pt will improve BERG to >/= 38/56 to decrease falls risk    Time  12    Period  Weeks    Status  Revised    Target Date  10/02/19      PT LONG TERM GOAL #4   Title  Pt will ambulate x 200' outside over pavement and grass with supervision and LRAD to improve safety ambulating to wood working shop at home    Time  12    Period  Weeks    Status  Revised    Target Date  10/02/19      PT LONG TERM GOAL #5   Title  Pt will negotiate 8 stairs with 2 rails with alternating sequence with appropriate AFO/Bioness with supervision    Time  12    Period  Weeks    Status  Revised    Target Date  10/02/19            Plan - 09/23/19 1618    Clinical Impression Statement  Treatment session continued to focus on open and closed chain hip ABD strengthening progressing repetitions and LE position to place greater demand on glute med and core.  Pt demonstrating improved endurance to be able to perform 3 sets x 10 reps without compensatory strategies.  Continued balance training with compliant surface and sit <> stand with initiation of stepping with cane.  Pt demonstrated significant improvement in balance and gait sequencing with cane today and has progressed to requiring only min A from therapist.  Will continue to progress towards LTG.    Comorbidities  multiple falls, vitamin D insufficiency, Type 1 IDDM, diabetic retinopathy, hypothyroidism, HTN, hyperlipidemia, R tibia fracture, L ankle fracture with ORIF    Rehab Potential  Fair    PT Frequency  2x / week    PT Duration  12 weeks    PT Treatment/Interventions  ADLs/Self Care Home  Management;Aquatic Therapy;Electrical Stimulation;DME Instruction;Gait training;Stair training;Functional mobility training;Therapeutic activities;Therapeutic exercise;Balance training;Neuromuscular re-education;Patient/family education;Orthotic Fit/Training;Passive range of motion;Energy conservation    PT Next Visit Plan  Needs more visits scheduled!!  Update HEP; Gait with cane if have +2; Really needs to work on hip ABD strengthening open and closed chain!  Standing resisted hip/knee flexion, walking with resistance,  Focus on gentle activation of hip and knee flexion in supine/standing/gait to avoid overflow to extensors, gait at counter with cane.  Quadruped or seated core strengthening.  Continue to focus on core/postural control training without UE support in standing, hip flexion and knee flexion strengthening, SLS and weight shift training (rockerboard). tall kneeling for proximal strengthening    Consulted and Agree with Plan of Care  Patient       Patient will benefit from skilled therapeutic intervention in order to improve the following deficits and impairments:  Abnormal gait, Decreased balance, Decreased endurance, Decreased coordination, Decreased range of motion, Decreased strength, Difficulty walking, Impaired tone, Postural dysfunction, Decreased activity tolerance, Decreased mobility  Visit Diagnosis: Other abnormalities of gait and mobility  Muscle weakness (generalized)  Unsteadiness on feet  Difficulty in walking, not elsewhere classified  Foot drop,  right  Foot drop, left  Repeated falls     Problem List Patient Active Problem List   Diagnosis Date Noted  . Gait disturbance 08/19/2019  . Acquired diplegia ([place]) 08/19/2019  . Vitamin D insufficiency 11/29/2016  . Hypogonadism in male 11/01/2016  . 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 Hypothyroidism   . Hypertension   . Hyperlipidemia   . Closed fracture of right fibula and tibia  10/29/2016  . Closed tibia fracture 10/29/2016  . Closed fracture of right tibial plafond with fibula involvement 10/29/2016  . Abnormal liver function tests 03/06/2013  . RASH AND OTHER NONSPECIFIC SKIN ERUPTION 10/07/2009  . SHOULDER PAIN, LEFT 12/27/2008  . Type I (juvenile type) diabetes mellitus without mention of complication, uncontrolled 12/03/2007  . HYPOTHYROIDISM 11/12/2007  . HYPERCHOLESTEROLEMIA 11/12/2007  . MULTIPLE SCLEROSIS 08/29/2007  . PROLIFERATIVE DIABETIC RETINOPATHY 08/29/2007  . HYPERTENSION 12/25/2006   [name], PT, DPT 09/23/19    4:26 PM    [facility] [address] [place], [place], [address] Phone: [phone]   Fax:  [phone]  Name: Johnathan Ross MRN: [id number] Date of Birth: 1971-04-10

## 2019-09-25 ENCOUNTER — Other Ambulatory Visit: Payer: Self-pay

## 2019-09-25 ENCOUNTER — Encounter: Payer: Self-pay | Admitting: Physical Therapy

## 2019-09-25 ENCOUNTER — Ambulatory Visit: Payer: Medicare Other | Admitting: Physical Therapy

## 2019-09-25 DIAGNOSIS — R262 Difficulty in walking, not elsewhere classified: Secondary | ICD-10-CM | POA: Diagnosis not present

## 2019-09-25 DIAGNOSIS — R2689 Other abnormalities of gait and mobility: Secondary | ICD-10-CM

## 2019-09-25 DIAGNOSIS — R296 Repeated falls: Secondary | ICD-10-CM | POA: Diagnosis not present

## 2019-09-25 DIAGNOSIS — R2681 Unsteadiness on feet: Secondary | ICD-10-CM | POA: Diagnosis not present

## 2019-09-25 DIAGNOSIS — M21371 Foot drop, right foot: Secondary | ICD-10-CM | POA: Diagnosis not present

## 2019-09-25 DIAGNOSIS — M6281 Muscle weakness (generalized): Secondary | ICD-10-CM | POA: Diagnosis not present

## 2019-09-25 NOTE — Therapy (Signed)
[facility] [address] [place], [place], [address] Phone: [phone]   Fax:  [phone]  Physical Therapy Treatment  Patient Details  Name: Johnathan Ross MRN: [id number] Date of Birth: 1970-06-29 Referring Provider (PT): Carylon Perches, MD   Encounter Date: 09/25/2019  PT End of Session - 09/25/19 0853    Visit Number  50    Number of Visits  83    Date for PT Re-Evaluation  10/02/19    Authorization Type  Medicare Part A and B; 10th visit PN    Progress Note Due on Visit  16    PT Start Time  0848    PT Stop Time  0929    PT Time Calculation (min)  41 min    Activity Tolerance  Patient tolerated treatment well;No increased pain;Patient limited by fatigue   limited by spasticity   Behavior During Therapy  [facility] for tasks assessed/performed       Past Medical History:  Diagnosis Date  . Hyperlipidemia   . Hypertension   . Hypogonadism in male 11/01/2016  . Hypothyroidism   . Multiple sclerosis ([place])   . Proliferative diabetic retinopathy(362.02)   . Type 1 diabetes mellitus ([place]) dx'd 1994  . Vitamin D insufficiency 11/29/2016    Past Surgical History:  Procedure Laterality Date  . EYE SURGERY Bilateral    "laser for diabetic retinopathy"  . FRACTURE SURGERY    . OPEN REDUCTION INTERNAL FIXATION (ORIF) TIBIA/FIBULA FRACTURE Right 10/30/2016  . ORIF ANKLE FRACTURE Left 2015  . ORIF TIBIA FRACTURE Right 10/30/2016   Procedure: OPEN REDUCTION INTERNAL FIXATION (ORIF) TIBIA FIBULA FRACTURE;  Surgeon: Altamese Mayfield, MD;  Location: [facility];  Service: Orthopedics;  Laterality: Right;  . RETINAL LASER PROCEDURE Bilateral    "for diabetic retinopathy"    There were no vitals filed for this visit.  Subjective Assessment - 09/25/19 0851    Subjective  No new complaints. No falls or pain to report.    Patient is accompained by:  Family member   mom in car   Pertinent History  multiple falls, vitamin D  insufficiency, Type 1 IDDM, diabetic retinopathy, hypothyroidism, HTN, hyperlipidemia, R tibia fracture, L ankle fracture with ORIF    Limitations  Walking    Diagnostic tests  No new lesions seen on MRI    Patient Stated Goals  To improve walking    Currently in Pain?  No/denies    Pain Score  0-No pain             [facility] Adult PT Treatment/Exercise - 09/25/19 0855      Transfers   Transfers  Sit to Stand;Stand to Sit    Sit to Stand  With upper extremity assist;From chair/3-in-1;From bed;5: Supervision    Stand to Sit  5: Supervision;With upper extremity assist;To bed;To chair/3-in-1    Floor to Transfer  5: Supervision;With upper extremity assist    Floor to Transfer Details (indicate cue type and reason)  with UE support mat table to/from red mat on floor      Neuro Re-ed    Neuro Re-ed Details   for strengthening/muscle re-ed: quadruped on red mat- glut kick backs alternating LE's for 10 reps each side with manual assist for trunk stability, then firehydrant with green band resistance for 10 reps each side with assist for trunk stability. cues for abd bracing with both ex's; tall kneeling- green band resisted mini squats in midline for 10 reps, then laterally  for 10 reps each way. intermittent touch to mat for balance with cues for full return to tall kneeling.        Exercises   Exercises  Other Exercises    Other Exercises   verbally reviewed HEP. pt states it is still hard, not easy yet, therefore did not advance HEP today.       Knee/Hip Exercises: Supine   Bridges  AROM;Strengthening;Both;2 sets;10 reps;Limitations    Bridges Limitations  with green band around knees pulled tight- cues for hold time with assist for LE stability needed at times due to decreased trunk control.       Knee/Hip Exercises: Sidelying   Other Sidelying Knee/Hip Exercises  performed on both sides: lifting top leg into hip abduction, then with sustained hold here had pt perform hip/knee  bending/straightening the leg (hip/knee flexion/extension) for 10 reps. (top leg is straight out while bottom leg is bent to provide stability. assist needed at times for sustained hold and as pt fatigued controlled lowering back down.      Other Sidelying Knee/Hip Exercises  attempted modified side planks in various positions. with pt propped on elbow he felt more work in shoulder vs core/hips. will continued to problem solve ways to work core/hips before next session.                PT Short Term Goals - 09/04/19 1500      PT SHORT TERM GOAL #1   Title  Patient will demonstrate independence with updated HEP    Baseline  09/03/19: met with current program    Status  Achieved      PT SHORT TERM GOAL #2   Title  Pt will improve gait velocity with RW to >/= 0.6 ft/sec with decreased use of UE to lift and swing RLE through    Baseline  09/03/19: 0.70 ft/sec with RW and AFO with decreased use of UE's to lift/swing through    Status  Achieved      PT SHORT TERM GOAL #3   Title  Pt will improve BERG to >/= 30/56    Baseline  09/03/19: 38/56 scored today    Status  Achieved      PT SHORT TERM GOAL #4   Title  Pt will ambulate x 100' over indoor surfaces with RW and AFO MOD I with 2 standing rest breaks and improved R hip and knee flexion and foot clearance without lifting through UE    Baseline  09/03/19: met distance as stated with 4 brief ( 3-5 sec's) rest breaks this session, improved just not to goal    Status  Partially Met    Target Date  09/03/19      PT SHORT TERM GOAL #5   Title  Pt will negotate 8 stairs with two rails step to sequence with supervision    Baseline  09/04/19: pt using bil rails and reciprocal pattern with extensor tone locking out left LE, supervision assist only.    Status  Achieved        PT Long Term Goals - 07/04/19 1225      PT LONG TERM GOAL #1   Title  Pt will demonstrate independence with land HEP    Time  12    Period  Weeks    Status  Revised     Target Date  10/02/19      PT LONG TERM GOAL #2   Title  Pt will improve gait velocity with RW to >/=  1.8 ft/sec    Time  12    Period  Weeks    Status  Revised    Target Date  10/02/19      PT LONG TERM GOAL #3   Title  Pt will improve BERG to >/= 38/56 to decrease falls risk    Time  12    Period  Weeks    Status  Revised    Target Date  10/02/19      PT LONG TERM GOAL #4   Title  Pt will ambulate x 200' outside over pavement and grass with supervision and LRAD to improve safety ambulating to wood working shop at home    Time  12    Period  Weeks    Status  Revised    Target Date  10/02/19      PT LONG TERM GOAL #5   Title  Pt will negotiate 8 stairs with 2 rails with alternating sequence with appropriate AFO/Bioness with supervision    Time  12    Period  Weeks    Status  Revised    Target Date  10/02/19            Plan - 09/25/19 0853    Clinical Impression Statement  Today's skilled session continued to focus on core and hip strengthening with rest breaks taken as needed. The pt continues to make steady progress and should benefit from continued PT to progress toward unmet goals.    Comorbidities  multiple falls, vitamin D insufficiency, Type 1 IDDM, diabetic retinopathy, hypothyroidism, HTN, hyperlipidemia, R tibia fracture, L ankle fracture with ORIF    Rehab Potential  Fair    PT Frequency  2x / week    PT Duration  12 weeks    PT Treatment/Interventions  ADLs/Self Care Home Management;Aquatic Therapy;Electrical Stimulation;DME Instruction;Gait training;Stair training;Functional mobility training;Therapeutic activities;Therapeutic exercise;Balance training;Neuromuscular re-education;Patient/family education;Orthotic Fit/Training;Passive range of motion;Energy conservation    PT Next Visit Plan  check LTGs for recert; Gait with cane if have +2; Really needs to work on hip ABD strengthening open and closed chain!  Standing resisted hip/knee flexion, walking with  resistance,  Focus on gentle activation of hip and knee flexion in supine/standing/gait to avoid overflow to extensors, gait at counter with cane.  Quadruped or seated core strengthening.  Continue to focus on core/postural control training without UE support in standing, hip flexion and knee flexion strengthening, SLS and weight shift training (rockerboard). tall kneeling for proximal strengthening    Consulted and Agree with Plan of Care  Patient       Patient will benefit from skilled therapeutic intervention in order to improve the following deficits and impairments:  Abnormal gait, Decreased balance, Decreased endurance, Decreased coordination, Decreased range of motion, Decreased strength, Difficulty walking, Impaired tone, Postural dysfunction, Decreased activity tolerance, Decreased mobility  Visit Diagnosis: Other abnormalities of gait and mobility  Muscle weakness (generalized)  Unsteadiness on feet  Difficulty in walking, not elsewhere classified     Problem List Patient Active Problem List   Diagnosis Date Noted  . Gait disturbance 08/19/2019  . Acquired diplegia ([place]) 08/19/2019  . Vitamin D insufficiency 11/29/2016  . Hypogonadism in male 11/01/2016  . 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 Hypothyroidism   . Hypertension   . Hyperlipidemia   . Closed fracture of right fibula and tibia 10/29/2016  . Closed tibia fracture 10/29/2016  . Closed fracture of right tibial plafond with fibula involvement 10/29/2016  .  Abnormal liver function tests 03/06/2013  . RASH AND OTHER NONSPECIFIC SKIN ERUPTION 10/07/2009  . SHOULDER PAIN, LEFT 12/27/2008  . Type I (juvenile type) diabetes mellitus without mention of complication, uncontrolled 12/03/2007  . HYPOTHYROIDISM 11/12/2007  . HYPERCHOLESTEROLEMIA 11/12/2007  . MULTIPLE SCLEROSIS 08/29/2007  . PROLIFERATIVE DIABETIC RETINOPATHY 08/29/2007  . HYPERTENSION 12/25/2006    Willow Ora, PTA,  [facility] [address] [place], [place] [address] [phone] 09/25/19, 11:36 AM   Name: Johnathan Ross MRN: [id number] Date of Birth: 02-03-1971

## 2019-09-30 ENCOUNTER — Ambulatory Visit: Payer: Medicare Other | Attending: Physician Assistant | Admitting: Physical Therapy

## 2019-09-30 ENCOUNTER — Other Ambulatory Visit: Payer: Self-pay

## 2019-09-30 ENCOUNTER — Encounter: Payer: Self-pay | Admitting: Physical Therapy

## 2019-09-30 DIAGNOSIS — M21371 Foot drop, right foot: Secondary | ICD-10-CM | POA: Diagnosis not present

## 2019-09-30 DIAGNOSIS — R2689 Other abnormalities of gait and mobility: Secondary | ICD-10-CM

## 2019-09-30 DIAGNOSIS — M6281 Muscle weakness (generalized): Secondary | ICD-10-CM | POA: Diagnosis not present

## 2019-09-30 DIAGNOSIS — R296 Repeated falls: Secondary | ICD-10-CM | POA: Insufficient documentation

## 2019-09-30 DIAGNOSIS — R262 Difficulty in walking, not elsewhere classified: Secondary | ICD-10-CM

## 2019-09-30 DIAGNOSIS — R2681 Unsteadiness on feet: Secondary | ICD-10-CM

## 2019-09-30 DIAGNOSIS — M21372 Foot drop, left foot: Secondary | ICD-10-CM | POA: Diagnosis not present

## 2019-09-30 NOTE — Therapy (Signed)
[facility] [address] [place], [place], [address] Phone: [phone]   Fax:  [phone]  Physical Therapy Treatment  Patient Details  Name: Johnathan Ross MRN: [id number] Date of Birth: 04-19-1971 Referring Provider (PT): Carylon Perches, MD   Encounter Date: 09/30/2019  PT End of Session - 09/30/19 0849    Visit Number  93    Number of Visits  4    Date for PT Re-Evaluation  10/02/19    Authorization Type  Medicare Part A and B; 10th visit PN    Progress Note Due on Visit  27    PT Start Time  0846    PT Stop Time  0928    PT Time Calculation (min)  42 min    Equipment Utilized During Treatment  Gait belt    Activity Tolerance  Patient tolerated treatment well;No increased pain;Patient limited by fatigue   limited by spasticity   Behavior During Therapy  [facility] for tasks assessed/performed       Past Medical History:  Diagnosis Date  . Hyperlipidemia   . Hypertension   . Hypogonadism in male 11/01/2016  . Hypothyroidism   . Multiple sclerosis ([place])   . Proliferative diabetic retinopathy(362.02)   . Type 1 diabetes mellitus ([place]) dx'd 1994  . Vitamin D insufficiency 11/29/2016    Past Surgical History:  Procedure Laterality Date  . EYE SURGERY Bilateral    "laser for diabetic retinopathy"  . FRACTURE SURGERY    . OPEN REDUCTION INTERNAL FIXATION (ORIF) TIBIA/FIBULA FRACTURE Right 10/30/2016  . ORIF ANKLE FRACTURE Left 2015  . ORIF TIBIA FRACTURE Right 10/30/2016   Procedure: OPEN REDUCTION INTERNAL FIXATION (ORIF) TIBIA FIBULA FRACTURE;  Surgeon: Altamese Wolsey, MD;  Location: [facility];  Service: Orthopedics;  Laterality: Right;  . RETINAL LASER PROCEDURE Bilateral    "for diabetic retinopathy"    There were no vitals filed for this visit.  Subjective Assessment - 09/30/19 0849    Subjective  No new complaints. No falls or pain to report.    Patient is accompained by:  Family member   mom in car    Pertinent History  multiple falls, vitamin D insufficiency, Type 1 IDDM, diabetic retinopathy, hypothyroidism, HTN, hyperlipidemia, R tibia fracture, L ankle fracture with ORIF    Limitations  Walking    Diagnostic tests  No new lesions seen on MRI    Patient Stated Goals  To improve walking    Currently in Pain?  No/denies    Pain Score  0-No pain         [facility] PT Assessment - 09/30/19 0853      Standardized Balance Assessment   Standardized Balance Assessment  10 meter walk test    10 Meter Walk  55.53 sec's= 0.60 ft/sec with RW/AFO. pt needing 3 standing rests to break extensor tone in right LE before continuing.       Berg Balance Test   Sit to Stand  Able to stand without using hands and stabilize independently    Standing Unsupported  Able to stand safely 2 minutes    Sitting with Back Unsupported but Feet Supported on Floor or Stool  Able to sit safely and securely 2 minutes    Stand to Sit  Controls descent by using hands    Transfers  Able to transfer safely, definite need of hands    Standing Unsupported with Eyes Closed  Able to stand 10 seconds with supervision  Standing Unsupported with Feet Together  Able to place feet together independently and stand for 1 minute with supervision    From Standing, Reach Forward with Outstretched Arm  Can reach confidently >25 cm (10")    From Standing Position, Pick up Object from [place] to pick up shoe safely and easily    From Standing Position, Turn to Look Behind Over each Shoulder  Looks behind one side only/other side shows less weight shift   left>right   Turn 360 Degrees  Needs assistance while turning   needed UE support at times on stable surfaces   Standing Unsupported, Alternately Place Feet on Step/Stool  Able to complete >2 steps/needs minimal assist   UE support, limited by extensor tone with lifting/stance   Standing Unsupported, One Foot in Front  Able to plae foot ahead of the other independently and hold 30  seconds    Standing on One Leg  Tries to lift leg/unable to hold 3 seconds but remains standing independently    Total Score  40    Berg comment:  40/56= significant risk for falls            [facility] Adult PT Treatment/Exercise - 09/30/19 0853      Transfers   Transfers  Sit to Stand;Stand to Sit    Sit to Stand  5: Supervision;4: Min guard;With upper extremity assist;Without upper extremity assist;From bed;From chair/3-in-1    Stand to Sit  5: Supervision;4: Min guard;With upper extremity assist;Without upper extremity assist;To bed;To chair/3-in-1      Ambulation/Gait   Ambulation/Gait  Yes    Ambulation/Gait Assistance  5: Supervision    Ambulation/Gait Assistance Details  multiple standing breaks today due to extensor tone in LE's.     Ambulation Distance (Feet)  --   in/out/around gym with testing.    Assistive device  Rolling walker    Gait Pattern  Step-through pattern;Decreased step length - left;Decreased stance time - right;Decreased stride length;Decreased hip/knee flexion - right;Decreased dorsiflexion - right;Right genu recurvatum;Poor foot clearance - right    Ambulation Surface  Level;Indoor               PT Short Term Goals - 09/04/19 1500      PT SHORT TERM GOAL #1   Title  Patient will demonstrate independence with updated HEP    Baseline  09/03/19: met with current program    Status  Achieved      PT SHORT TERM GOAL #2   Title  Pt will improve gait velocity with RW to >/= 0.6 ft/sec with decreased use of UE to lift and swing RLE through    Baseline  09/03/19: 0.70 ft/sec with RW and AFO with decreased use of UE's to lift/swing through    Status  Achieved      PT SHORT TERM GOAL #3   Title  Pt will improve BERG to >/= 30/56    Baseline  09/03/19: 38/56 scored today    Status  Achieved      PT SHORT TERM GOAL #4   Title  Pt will ambulate x 100' over indoor surfaces with RW and AFO MOD I with 2 standing rest breaks and improved R hip and knee flexion  and foot clearance without lifting through UE    Baseline  09/03/19: met distance as stated with 4 brief ( 3-5 sec's) rest breaks this session, improved just not to goal    Status  Partially Met    Target Date  09/03/19      PT SHORT TERM GOAL #5   Title  Pt will negotate 8 stairs with two rails step to sequence with supervision    Baseline  09/04/19: pt using bil rails and reciprocal pattern with extensor tone locking out left LE, supervision assist only.    Status  Achieved        PT Long Term Goals - 09/30/19 0850      PT LONG TERM GOAL #1   Title  Pt will demonstrate independence with land HEP    Baseline  09/30/19: met with current program, will need to have program updated/upgraded as he progresses    Status  Achieved      PT LONG TERM GOAL #2   Title  Pt will improve gait velocity with RW to >/= 1.8 ft/sec    Baseline  09/30/19: 55.53 sec's- 0.60 ft/sec with RW. of note pt with increased tone with testing today    Time  --    Period  --    Status  Not Met      PT LONG TERM GOAL #3   Title  Pt will improve BERG to >/= 38/56 to decrease falls risk    Baseline  09/30/19: 40/56 scored today    Time  --    Period  --    Status  Achieved      PT LONG TERM GOAL #4   Title  Pt will ambulate x 200' outside over pavement and grass with supervision and LRAD to improve safety ambulating to wood working shop at home    Time  12    Period  Weeks    Status  On-going      PT LONG TERM GOAL #5   Title  Pt will negotiate 8 stairs with 2 rails with alternating sequence with appropriate AFO/Bioness with supervision    Time  12    Period  Weeks    Status  On-going            Plan - 09/30/19 0850    Clinical Impression Statement  Today's skilled session began to address progress toward LTGs with Berg Balance test score met and HEP goal met. Pt did not meet the gait speed goal as he had several times he needed to stop due to increased tone/spasms. Will plan to reassess this at next visit.     Comorbidities  multiple falls, vitamin D insufficiency, Type 1 IDDM, diabetic retinopathy, hypothyroidism, HTN, hyperlipidemia, R tibia fracture, L ankle fracture with ORIF    Rehab Potential  Fair    PT Frequency  2x / week    PT Duration  12 weeks    PT Treatment/Interventions  ADLs/Self Care Home Management;Aquatic Therapy;Electrical Stimulation;DME Instruction;Gait training;Stair training;Functional mobility training;Therapeutic activities;Therapeutic exercise;Balance training;Neuromuscular re-education;Patient/family education;Orthotic Fit/Training;Passive range of motion;Energy conservation    PT Next Visit Plan  check remaining LTGs for recert; Gait with cane if have +2; Really needs to work on hip ABD strengthening open and closed chain!  Standing resisted hip/knee flexion, walking with resistance,  Focus on gentle activation of hip and knee flexion in supine/standing/gait to avoid overflow to extensors, gait at counter with cane.  Quadruped or seated core strengthening.  Continue to focus on core/postural control training without UE support in standing, hip flexion and knee flexion strengthening, SLS and weight shift training (rockerboard). tall kneeling for proximal strengthening    Consulted and Agree with Plan of Care  Patient  Patient will benefit from skilled therapeutic intervention in order to improve the following deficits and impairments:  Abnormal gait, Decreased balance, Decreased endurance, Decreased coordination, Decreased range of motion, Decreased strength, Difficulty walking, Impaired tone, Postural dysfunction, Decreased activity tolerance, Decreased mobility  Visit Diagnosis: Other abnormalities of gait and mobility  Muscle weakness (generalized)  Unsteadiness on feet  Difficulty in walking, not elsewhere classified  Foot drop, right  Foot drop, left     Problem List Patient Active Problem List   Diagnosis Date Noted  . Gait disturbance 08/19/2019   . Acquired diplegia ([place]) 08/19/2019  . Vitamin D insufficiency 11/29/2016  . Hypogonadism in male 11/01/2016  . 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)   . Hypothyroidism   . Hypertension   . Hyperlipidemia   . Closed fracture of right fibula and tibia 10/29/2016  . Closed tibia fracture 10/29/2016  . Closed fracture of right tibial plafond with fibula involvement 10/29/2016  . Abnormal liver function tests 03/06/2013  . RASH AND OTHER NONSPECIFIC SKIN ERUPTION 10/07/2009  . SHOULDER PAIN, LEFT 12/27/2008  . Type I (juvenile type) diabetes mellitus without mention of complication, uncontrolled 12/03/2007  . HYPOTHYROIDISM 11/12/2007  . HYPERCHOLESTEROLEMIA 11/12/2007  . MULTIPLE SCLEROSIS 08/29/2007  . PROLIFERATIVE DIABETIC RETINOPATHY 08/29/2007  . HYPERTENSION 12/25/2006    Willow Ora, PTA, [facility] [address], [place], [place] [address] [phone] 09/30/19, 3:36 PM   Name: Johnathan Ross MRN: [id number] Date of Birth: 04-25-1971

## 2019-10-02 ENCOUNTER — Ambulatory Visit: Payer: Medicare Other | Admitting: Physical Therapy

## 2019-10-02 ENCOUNTER — Other Ambulatory Visit: Payer: Self-pay

## 2019-10-02 ENCOUNTER — Encounter: Payer: Self-pay | Admitting: Physical Therapy

## 2019-10-02 DIAGNOSIS — R2689 Other abnormalities of gait and mobility: Secondary | ICD-10-CM | POA: Diagnosis not present

## 2019-10-02 DIAGNOSIS — M21371 Foot drop, right foot: Secondary | ICD-10-CM | POA: Diagnosis not present

## 2019-10-02 DIAGNOSIS — R2681 Unsteadiness on feet: Secondary | ICD-10-CM | POA: Diagnosis not present

## 2019-10-02 DIAGNOSIS — R262 Difficulty in walking, not elsewhere classified: Secondary | ICD-10-CM | POA: Diagnosis not present

## 2019-10-02 DIAGNOSIS — M21372 Foot drop, left foot: Secondary | ICD-10-CM | POA: Diagnosis not present

## 2019-10-02 DIAGNOSIS — M6281 Muscle weakness (generalized): Secondary | ICD-10-CM

## 2019-10-02 DIAGNOSIS — R296 Repeated falls: Secondary | ICD-10-CM

## 2019-10-02 NOTE — Therapy (Addendum)
[facility] [address] [place], [place], [address] Phone: [phone]   Fax:  [phone]  Physical Therapy Treatment  Patient Details  Name: Johnathan Ross MRN: [id number] Date of Birth: Jul 09, 1970 Referring Provider (PT): Carylon Perches, MD   Encounter Date: 10/02/2019  PT End of Session - 10/02/19 0934    Visit Number  74    Number of Visits  66    Date for PT Re-Evaluation  10/02/19    Authorization Type  Medicare Part A and B; 10th visit PN    Progress Note Due on Visit  35    PT Start Time  0850    PT Stop Time  0933    PT Time Calculation (min)  43 min    Activity Tolerance  Patient tolerated treatment well;Patient limited by fatigue   limited by spasticity   Behavior During Therapy  [facility] for tasks assessed/performed       Past Medical History:  Diagnosis Date  . Hyperlipidemia   . Hypertension   . Hypogonadism in male 11/01/2016  . Hypothyroidism   . Multiple sclerosis ([place])   . Proliferative diabetic retinopathy(362.02)   . Type 1 diabetes mellitus ([place]) dx'd 1994  . Vitamin D insufficiency 11/29/2016    Past Surgical History:  Procedure Laterality Date  . EYE SURGERY Bilateral    "laser for diabetic retinopathy"  . FRACTURE SURGERY    . OPEN REDUCTION INTERNAL FIXATION (ORIF) TIBIA/FIBULA FRACTURE Right 10/30/2016  . ORIF ANKLE FRACTURE Left 2015  . ORIF TIBIA FRACTURE Right 10/30/2016   Procedure: OPEN REDUCTION INTERNAL FIXATION (ORIF) TIBIA FIBULA FRACTURE;  Surgeon: Altamese Justice, MD;  Location: [facility];  Service: Orthopedics;  Laterality: Right;  . RETINAL LASER PROCEDURE Bilateral    "for diabetic retinopathy"    There were no vitals filed for this visit.  Subjective Assessment - 10/02/19 0855    Subjective  No issues to report, feels like tone is a little worse today.    Patient is accompained by:  Family member   mom in car   Pertinent History  multiple falls, vitamin D  insufficiency, Type 1 IDDM, diabetic retinopathy, hypothyroidism, HTN, hyperlipidemia, R tibia fracture, L ankle fracture with ORIF    Limitations  Walking    Diagnostic tests  No new lesions seen on MRI    Patient Stated Goals  To improve walking    Currently in Pain?  No/denies         [facility] PT Assessment - 10/02/19 0855      Assessment   Medical Diagnosis  MS    Referring Provider (PT)  Carylon Perches, MD    Onset Date/Surgical Date  12/15/18    Prior Therapy  yes, outpatient PT following fall with ankle fracture      Precautions   Precautions  Other (comment)    Precaution Comments  multiple falls, vitamin D insufficiency, Type 1 IDDM, diabetic retinopathy, hypothyroidism, HTN, hyperlipidemia, R tibia fracture, L ankle fracture with ORIF    Required Braces or Orthoses  Other Brace/Splint    Other Brace/Splint  AFO RLE      Prior Function   Level of Independence  Independent      Observation/Other Assessments   Focus on Therapeutic Outcomes (FOTO)   N/A      Ambulation/Gait   Stairs  Yes    Stairs Assistance  5: Supervision    Stairs Assistance Details (indicate cue type and reason)  one  episode of R knee hyperextension and difficulty advancing to next step.  Overall, able to do alternating with extra time    Stair Management Technique  Two rails;Alternating pattern;Forwards    Number of Stairs  8    Height of Stairs  6      Standardized Balance Assessment   Standardized Balance Assessment  10 meter walk test    10 Meter Walk  36 seconds with RW; .91 ft/sec          10/02/19 0855  Ambulation/Gait  Ambulation/Gait Yes  Ambulation/Gait Assistance Other (comment) (+2 to guard, PT provided min A overall)  Ambulation/Gait Assistance Details attempted to perform gait training with cane at end of session; pt only able to ambulate 20' due to fatigue and LE spasticity causing genu recurvatum and increased difficulty maintaining forward momentum, increased stance time and  initiation of swing phase even with therapist providing facilitation at pelvis and trunk.  Returned to [employer] (Feet) 20 Feet  Assistive device Straight cane  Gait Pattern Step-to pattern;Decreased step length - right;Decreased step length - left;Decreased stance time - right;Decreased stance time - left;Decreased stride length;Decreased hip/knee flexion - right;Decreased hip/knee flexion - left;Decreased dorsiflexion - right;Right genu recurvatum;Left genu recurvatum;Lateral trunk lean to left;Poor foot clearance - right  Ambulation Surface Level;Indoor  Stairs Yes  Stairs Assistance 5: Supervision  Stairs Assistance Details (indicate cue type and reason) one episode of R knee hyperextension and difficulty advancing to next step.  Overall, able to do alternating with extra time  Stair Management Technique Two rails;Alternating pattern;Forwards  Number of Stairs 8  Height of Stairs 6     Balance Exercises - 10/02/19 0924      Balance Exercises: Standing   Sidestepping  4 reps;Theraband    Theraband Level (Sidestepping)  Level 3 (Green)   green, L and R with squat   Other Standing Exercises  Monster walks forwards with green theraband around LE x 3 reps down and back with UE support on // bars and min A          PT Short Term Goals - 09/04/19 1500      PT SHORT TERM GOAL #1   Title  Patient will demonstrate independence with updated HEP    Baseline  09/03/19: met with current program    Status  Achieved      PT SHORT TERM GOAL #2   Title  Pt will improve gait velocity with RW to >/= 0.6 ft/sec with decreased use of UE to lift and swing RLE through    Baseline  09/03/19: 0.70 ft/sec with RW and AFO with decreased use of UE's to lift/swing through    Status  Achieved      PT SHORT TERM GOAL #3   Title  Pt will improve BERG to >/= 30/56    Baseline  09/03/19: 38/56 scored today    Status  Achieved      PT SHORT TERM GOAL #4   Title  Pt will ambulate x 100' over  indoor surfaces with RW and AFO MOD I with 2 standing rest breaks and improved R hip and knee flexion and foot clearance without lifting through UE    Baseline  09/03/19: met distance as stated with 4 brief ( 3-5 sec's) rest breaks this session, improved just not to goal    Status  Partially Met    Target Date  09/03/19      PT SHORT TERM GOAL #5  Title  Pt will negotate 8 stairs with two rails step to sequence with supervision    Baseline  09/04/19: pt using bil rails and reciprocal pattern with extensor tone locking out left LE, supervision assist only.    Status  Achieved        PT Long Term Goals - 10/02/19 0900      PT LONG TERM GOAL #1   Title  Pt will demonstrate independence with land HEP    Baseline  09/30/19: met with current program, will need to have program updated/upgraded as he progresses    Status  Achieved      PT LONG TERM GOAL #2   Title  Pt will improve gait velocity with RW to >/= 1.8 ft/sec    Baseline  .9 ft/sec with RW    Status  Partially Met      PT LONG TERM GOAL #3   Title  Pt will improve BERG to >/= 38/56 to decrease falls risk    Baseline  09/30/19: 40/56 scored today    Status  Achieved      PT LONG TERM GOAL #4   Title  Pt will ambulate x 200' outside over pavement and grass with supervision and LRAD to improve safety ambulating to wood working shop at home    Baseline  pt doing at home with RW - would like to go back to using the cane    Time  12    Period  Weeks    Status  Achieved      PT LONG TERM GOAL #5   Title  Pt will negotiate 8 stairs with 2 rails with alternating sequence with appropriate AFO/Bioness with supervision    Time  12    Period  Weeks    Status  On-going        New Goals for recertification:    PT Short Term Goals - 10/04/19 1806      PT SHORT TERM GOAL #1   Title  Patient will demonstrate independence with updated HEP  (Update STG after 07/30/44 - 90 day cert)    Time  4    Period  Weeks    Status  Revised    Target  Date  11/03/19      PT SHORT TERM GOAL #2   Title  Pt will improve gait velocity with RW to >/= 1.4 ft/sec    Baseline  .91 ft/sec with RW    Time  4    Period  Weeks    Status  Revised    Target Date  11/03/19      PT SHORT TERM GOAL #3   Title  Pt will improve BERG to >/= 44/56    Baseline  40/56    Time  4    Period  Weeks    Status  Revised    Target Date  11/03/19      PT SHORT TERM GOAL #4   Title  Pt will ambulate x 230' over flat, indoor surfaces with cane with min A and intermittent standing rest breaks.    Time  4    Period  Weeks    Status  Revised      PT SHORT TERM GOAL #5   Title  Pt will negotate 8 stairs with two rails step alternating sequence with supervision    Time  4    Period  Weeks    Status  Revised    Target Date  11/03/19         PT Long Term Goals - 10/04/19 1811      PT LONG TERM GOAL #1   Title  Pt will demonstrate independence with land HEP    Time  12    Period  Weeks    Status  Revised    Target Date  01/02/20      PT LONG TERM GOAL #2   Title  Pt will improve gait velocity with cane to >/= 0.8 ft/sec    Baseline  Not assessed with cane    Time  12    Period  Weeks    Status  Revised    Target Date  01/02/20      PT LONG TERM GOAL #3   Title  Pt will improve BERG to >/= 46/56 to decrease falls risk    Time  12    Period  Weeks    Status  Revised    Target Date  01/02/20      PT LONG TERM GOAL #4   Title  Pt will ambulate x 200' outside over pavement and grass with supervision and cane to improve safety ambulating to wood working shop at home    Time  12    Period  Weeks    Status  Revised    Target Date  01/02/20      PT LONG TERM GOAL #5   Title  Pt will negotiate 8 stairs with one rail and cane MOD I for safe home entry/exit with cane    Time  12    Period  Weeks    Status  Revised    Target Date  01/02/20         Plan - 10/04/19 1750    Clinical Impression Statement  Continued assessment of LTG with pt  making steady progress - pt has met 3/5 LTG, partially meeting other two goals.  Pt is demonstrating improved LE strength, decreased spasticity, and improved standing balance resulting in more normal and safe gait pattern.  Pt has been able to return to gait training with cane due to improvements but is not safe to ambulate with cane only.  Will continue to address impairments in strength, functional endurance, balance and gait to maximize functional mobility independence and decrease falls risk.    Comorbidities  multiple falls, vitamin D insufficiency, Type 1 IDDM, diabetic retinopathy, hypothyroidism, HTN, hyperlipidemia, R tibia fracture, L ankle fracture with ORIF    Rehab Potential  Good    PT Frequency  2x / week    PT Duration  12 weeks    PT Treatment/Interventions  ADLs/Self Care Home Management;Aquatic Therapy;Electrical Stimulation;DME Instruction;Gait training;Stair training;Functional mobility training;Therapeutic activities;Therapeutic exercise;Balance training;Neuromuscular re-education;Patient/family education;Orthotic Fit/Training;Passive range of motion;Energy conservation    PT Next Visit Plan  Gait with cane if have +2; Really needs to work on hip ABD strengthening open and closed chain!  Rockerboard.  Standing resisted hip/knee flexion, walking with resistance (monster walks, pushing BOSU),  Focus on gentle activation of hip and knee flexion in supine/standing/gait to avoid overflow to extensors, gait at counter with cane.  Quadruped or seated core strengthening.  Continue to focus on core/postural control training without UE support in standing, hip flexion and knee flexion strengthening, SLS and weight shift training (rockerboard). tall kneeling for proximal strengthening    Consulted and Agree with Plan of Care  Patient       Patient will benefit from skilled therapeutic intervention in order  to improve the following deficits and impairments:  Abnormal gait, Decreased balance,  Decreased endurance, Decreased coordination, Decreased range of motion, Decreased strength, Difficulty walking, Impaired tone, Postural dysfunction, Decreased activity tolerance, Decreased mobility  Visit Diagnosis: Other abnormalities of gait and mobility  Muscle weakness (generalized)  Unsteadiness on feet  Difficulty in walking, not elsewhere classified  Foot drop, right  Foot drop, left  Repeated falls     Problem List Patient Active Problem List   Diagnosis Date Noted  . Gait disturbance 08/19/2019  . Acquired diplegia ([place]) 08/19/2019  . Vitamin D insufficiency 11/29/2016  . Hypogonadism in male 11/01/2016  . 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[place])   . Hypothyroidism   . Hypertension   . Hyperlipidemia   . Closed fracture of right fibula and tibia 10/29/2016  . Closed tibia fracture 10/29/2016  . Closed fracture of right tibial plafond with fibula involvement 10/29/2016  . Abnormal liver function tests 03/06/2013  . RASH AND OTHER NONSPECIFIC SKIN ERUPTION 10/07/2009  . SHOULDER PAIN, LEFT 12/27/2008  . Type I (juvenile type) diabetes mellitus without mention of complication, uncontrolled 12/03/2007  . HYPOTHYROIDISM 11/12/2007  . HYPERCHOLESTEROLEMIA 11/12/2007  . MULTIPLE SCLEROSIS 08/29/2007  . PROLIFERATIVE DIABETIC RETINOPATHY 08/29/2007  . HYPERTENSION 12/25/2006    [name], PT, DPT 10/04/19    6:03 PM  [facility] [address] [place], [place], [address] Phone: [phone]   Fax:  [phone]  Name: Johnathan Ross MRN: [id number] Date of Birth: [birth year]

## 2019-10-04 NOTE — Addendum Note (Signed)
Addended by: [name] on: 10/04/2019 06:15 PM   Modules accepted: Orders

## 2019-10-06 ENCOUNTER — Ambulatory Visit: Payer: Medicare Other | Admitting: Physical Therapy

## 2019-10-06 ENCOUNTER — Other Ambulatory Visit: Payer: Self-pay

## 2019-10-06 ENCOUNTER — Encounter: Payer: Self-pay | Admitting: Physical Therapy

## 2019-10-06 DIAGNOSIS — R2681 Unsteadiness on feet: Secondary | ICD-10-CM | POA: Diagnosis not present

## 2019-10-06 DIAGNOSIS — M21371 Foot drop, right foot: Secondary | ICD-10-CM | POA: Diagnosis not present

## 2019-10-06 DIAGNOSIS — M21372 Foot drop, left foot: Secondary | ICD-10-CM | POA: Diagnosis not present

## 2019-10-06 DIAGNOSIS — R262 Difficulty in walking, not elsewhere classified: Secondary | ICD-10-CM | POA: Diagnosis not present

## 2019-10-06 DIAGNOSIS — M6281 Muscle weakness (generalized): Secondary | ICD-10-CM

## 2019-10-06 DIAGNOSIS — R2689 Other abnormalities of gait and mobility: Secondary | ICD-10-CM | POA: Diagnosis not present

## 2019-10-07 NOTE — Therapy (Signed)
[facility] [address] [place], [place], [address] Phone: [phone]   Fax:  [phone]  Physical Therapy Treatment  Patient Details  Name: Johnathan Ross MRN: [id number] Date of Birth: 02-02-1971 Referring Provider (PT): Carylon Perches, MD   Encounter Date: 10/06/2019  PT End of Session - 10/06/19 0853    Visit Number  40    Number of Visits  67    Date for PT Re-Evaluation  01/02/20    Authorization Type  Medicare Part A and B; 10th visit PN    Progress Note Due on Visit  18    PT Start Time  0847    PT Stop Time  0930    PT Time Calculation (min)  43 min    Equipment Utilized During Treatment  Gait belt    Activity Tolerance  Patient tolerated treatment well;Patient limited by fatigue    Behavior During Therapy  [facility] for tasks assessed/performed       Past Medical History:  Diagnosis Date  . Hyperlipidemia   . Hypertension   . Hypogonadism in male 11/01/2016  . Hypothyroidism   . Multiple sclerosis ([place])   . Proliferative diabetic retinopathy(362.02)   . Type 1 diabetes mellitus ([place]) dx'd 1994  . Vitamin D insufficiency 11/29/2016    Past Surgical History:  Procedure Laterality Date  . EYE SURGERY Bilateral    "laser for diabetic retinopathy"  . FRACTURE SURGERY    . OPEN REDUCTION INTERNAL FIXATION (ORIF) TIBIA/FIBULA FRACTURE Right 10/30/2016  . ORIF ANKLE FRACTURE Left 2015  . ORIF TIBIA FRACTURE Right 10/30/2016   Procedure: OPEN REDUCTION INTERNAL FIXATION (ORIF) TIBIA FIBULA FRACTURE;  Surgeon: Altamese Canby, MD;  Location: [facility];  Service: Orthopedics;  Laterality: Right;  . RETINAL LASER PROCEDURE Bilateral    "for diabetic retinopathy"    There were no vitals filed for this visit.  Subjective Assessment - 10/06/19 0851    Subjective  No new complatins. No falls or pain. Feels the tone has been okay this morning.    Patient is accompained by:  Family member    Pertinent History   multiple falls, vitamin D insufficiency, Type 1 IDDM, diabetic retinopathy, hypothyroidism, HTN, hyperlipidemia, R tibia fracture, L ankle fracture with ORIF    Limitations  Walking    Diagnostic tests  No new lesions seen on MRI    Patient Stated Goals  To improve walking    Currently in Pain?  No/denies    Pain Score  0-No pain            [facility] Adult PT Treatment/Exercise - 10/06/19 0853      Ambulation/Gait   Ambulation/Gait  Yes    Ambulation/Gait Assistance  4: Min assist   plus 2cd person standby/min guard for safety   Ambulation/Gait Assistance Details  most assist needed at pelvis for stance/lateral stability. cues for upright posture due to forward lean at times. when pt's right LE "locks out" with extensor tone pt tends to have increase left lateral lean to unlock it causing imbalance needing assitance.     Ambulation Distance (Feet)  15 Feet   x2   Assistive device  Straight cane    Gait Pattern  Step-to pattern;Decreased step length - right;Decreased step length - left;Decreased stance time - right;Decreased stance time - left;Decreased stride length;Decreased hip/knee flexion - right;Decreased hip/knee flexion - left;Decreased dorsiflexion - right;Right genu recurvatum;Left genu recurvatum;Lateral trunk lean to left;Poor foot clearance - right  Ambulation Surface  Level;Indoor      Neuro Re-ed    Neuro Re-ed Details   for strengthening/muscle re-ed: tall kneeling on red mat table- with 3# weighted ball mini squats with CP for 10 reps; with green theraband resistance- mini squats in midline for 10 reps, then laterally for 10 reps each way intermittent UE touch to mat for balance; then with sustained pull with green band in lateral direction- alternating UE raises, bil UE raises for 8-10 reps each, then with arms at sides for head movements left<>right, up<>down for ~8 reps each. Intermittent touch to mat table needed for balance. Cues needed to maintain upright posture with  tall kneeling with all ex's performed.               PT Short Term Goals - 10/04/19 1806      PT SHORT TERM GOAL #1   Title  Patient will demonstrate independence with updated HEP  (Update STG after [id number] - 90 day cert)    Time  4    Period  Weeks    Status  Revised    Target Date  11/03/19      PT SHORT TERM GOAL #2   Title  Pt will improve gait velocity with RW to >/= 1.4 ft/sec    Baseline  .91 ft/sec with RW    Time  4    Period  Weeks    Status  Revised    Target Date  11/03/19      PT SHORT TERM GOAL #3   Title  Pt will improve BERG to >/= 44/56    Baseline  40/56    Time  4    Period  Weeks    Status  Revised    Target Date  11/03/19      PT SHORT TERM GOAL #4   Title  Pt will ambulate x 230' over flat, indoor surfaces with cane with min A and intermittent standing rest breaks.    Time  4    Period  Weeks    Status  Revised      PT SHORT TERM GOAL #5   Title  Pt will negotate 8 stairs with two rails step alternating sequence with supervision    Time  4    Period  Weeks    Status  Revised    Target Date  11/03/19        PT Long Term Goals - 10/04/19 1811      PT LONG TERM GOAL #1   Title  Pt will demonstrate independence with land HEP    Time  12    Period  Weeks    Status  Revised    Target Date  01/02/20      PT LONG TERM GOAL #2   Title  Pt will improve gait velocity with cane to >/= 0.8 ft/sec    Baseline  Not assessed with cane    Time  12    Period  Weeks    Status  Revised    Target Date  01/02/20      PT LONG TERM GOAL #3   Title  Pt will improve BERG to >/= 46/56 to decrease falls risk    Time  12    Period  Weeks    Status  Revised    Target Date  01/02/20      PT LONG TERM GOAL #4   Title  Pt will ambulate x 200' outside over  pavement and grass with supervision and cane to improve safety ambulating to wood working shop at home    Time  12    Period  Weeks    Status  Revised    Target Date  01/02/20      PT LONG TERM  GOAL #5   Title  Pt will negotiate 8 stairs with one rail and cane MOD I for safe home entry/exit with cane    Time  12    Period  Weeks    Status  Revised    Target Date  01/02/20            Plan - 10/06/19 0853    Clinical Impression Statement  Today's skilled session continued to focus on strengthening and short distance gait with cane. Pt continues to have increased lateral sway/weight shifitng with gait with cane due to extensor tone and working to bend hip/knee. This causes him to need increased assitance for balance. The pt is progressing toward goals and should benefit from continued PT to progress toward unmet goals.    Comorbidities  multiple falls, vitamin D insufficiency, Type 1 IDDM, diabetic retinopathy, hypothyroidism, HTN, hyperlipidemia, R tibia fracture, L ankle fracture with ORIF    Rehab Potential  Good    PT Frequency  2x / week    PT Duration  12 weeks    PT Treatment/Interventions  ADLs/Self Care Home Management;Aquatic Therapy;Electrical Stimulation;DME Instruction;Gait training;Stair training;Functional mobility training;Therapeutic activities;Therapeutic exercise;Balance training;Neuromuscular re-education;Patient/family education;Orthotic Fit/Training;Passive range of motion;Energy conservation    PT Next Visit Plan  Staggered stance working on initiating hip/knee flexion for pregait with decreased lateral weight shifting; Gait with cane if have +2; Really needs to work on hip ABD strengthening open and closed chain!  Rockerboard.  Standing resisted hip/knee flexion, walking with resistance (monster walks, pushing BOSU),  Focus on gentle activation of hip and knee flexion in supine/standing/gait to avoid overflow to extensors, gait at counter with cane.  Quadruped or seated core strengthening.  Continue to focus on core/postural control training without UE support in standing, hip flexion and knee flexion strengthening, SLS and weight shift training (rockerboard). tall  kneeling for proximal strengthening    PT Home Exercise Plan  Revised HEP on 3/18    Consulted and Agree with Plan of Care  Patient       Patient will benefit from skilled therapeutic intervention in order to improve the following deficits and impairments:  Abnormal gait, Decreased balance, Decreased endurance, Decreased coordination, Decreased range of motion, Decreased strength, Difficulty walking, Impaired tone, Postural dysfunction, Decreased activity tolerance, Decreased mobility  Visit Diagnosis: Other abnormalities of gait and mobility  Muscle weakness (generalized)  Unsteadiness on feet  Difficulty in walking, not elsewhere classified     Problem List Patient Active Problem List   Diagnosis Date Noted  . Gait disturbance 08/19/2019  . Acquired diplegia ([place]) 08/19/2019  . Vitamin D insufficiency 11/29/2016  . Hypogonadism in male 11/01/2016  . 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[place])   . Hypothyroidism   . Hypertension   . Hyperlipidemia   . Closed fracture of right fibula and tibia 10/29/2016  . Closed tibia fracture 10/29/2016  . Closed fracture of right tibial plafond with fibula involvement 10/29/2016  . Abnormal liver function tests 03/06/2013  . RASH AND OTHER NONSPECIFIC SKIN ERUPTION 10/07/2009  . SHOULDER PAIN, LEFT 12/27/2008  . Type I (juvenile type) diabetes mellitus without mention of complication, uncontrolled 12/03/2007  . HYPOTHYROIDISM 11/12/2007  .  HYPERCHOLESTEROLEMIA 11/12/2007  . MULTIPLE SCLEROSIS 08/29/2007  . PROLIFERATIVE DIABETIC RETINOPATHY 08/29/2007  . HYPERTENSION 12/25/2006    Willow Ora, PTA, [facility] [address], [place], [place] [address] [phone] 10/07/19, 9:00 AM   Name: ZYKEE LOYA MRN: [id number] Date of Birth: 07/08/1970

## 2019-10-08 ENCOUNTER — Other Ambulatory Visit: Payer: Self-pay

## 2019-10-08 ENCOUNTER — Ambulatory Visit: Payer: Medicare Other | Admitting: Physical Therapy

## 2019-10-08 DIAGNOSIS — M6281 Muscle weakness (generalized): Secondary | ICD-10-CM

## 2019-10-08 DIAGNOSIS — R2681 Unsteadiness on feet: Secondary | ICD-10-CM

## 2019-10-08 DIAGNOSIS — R2689 Other abnormalities of gait and mobility: Secondary | ICD-10-CM

## 2019-10-08 DIAGNOSIS — R262 Difficulty in walking, not elsewhere classified: Secondary | ICD-10-CM

## 2019-10-08 DIAGNOSIS — M21371 Foot drop, right foot: Secondary | ICD-10-CM

## 2019-10-08 DIAGNOSIS — M21372 Foot drop, left foot: Secondary | ICD-10-CM | POA: Diagnosis not present

## 2019-10-08 NOTE — Therapy (Signed)
[facility] [address] [place], [place], [address] Phone: [phone]   Fax:  [phone]  Physical Therapy Treatment  Patient Details  Name: Johnathan Ross MRN: [id number] Date of Birth: 1971/05/27 Referring Provider (PT): Carylon Perches, MD   Encounter Date: 10/08/2019  PT End of Session - 10/08/19 0854    Visit Number  55    Number of Visits  23    Date for PT Re-Evaluation  01/02/20    Authorization Type  Medicare Part A and B; 10th visit PN    Progress Note Due on Visit  36    PT Start Time  0847    PT Stop Time  0930    PT Time Calculation (min)  43 min    Equipment Utilized During Treatment  Gait belt    Activity Tolerance  Patient tolerated treatment well;Patient limited by fatigue;No increased pain    Behavior During Therapy  WFL for tasks assessed/performed       Past Medical History:  Diagnosis Date  . Hyperlipidemia   . Hypertension   . Hypogonadism in male 11/01/2016  . Hypothyroidism   . Multiple sclerosis ([place])   . Proliferative diabetic retinopathy(362.02)   . Type 1 diabetes mellitus ([place]) dx'd 1994  . Vitamin D insufficiency 11/29/2016    Past Surgical History:  Procedure Laterality Date  . EYE SURGERY Bilateral    "laser for diabetic retinopathy"  . FRACTURE SURGERY    . OPEN REDUCTION INTERNAL FIXATION (ORIF) TIBIA/FIBULA FRACTURE Right 10/30/2016  . ORIF ANKLE FRACTURE Left 2015  . ORIF TIBIA FRACTURE Right 10/30/2016   Procedure: OPEN REDUCTION INTERNAL FIXATION (ORIF) TIBIA FIBULA FRACTURE;  Surgeon: Altamese [place], MD;  Location: [facility];  Service: Orthopedics;  Laterality: Right;  . RETINAL LASER PROCEDURE Bilateral    "for diabetic retinopathy"    There were no vitals filed for this visit.  Subjective Assessment - 10/08/19 0852    Subjective  Had some pain occur in right LE last night when it locked out on him. Still sore today. No falls.    Patient is accompained by:   Family member   mom in car   Pertinent History  multiple falls, vitamin D insufficiency, Type 1 IDDM, diabetic retinopathy, hypothyroidism, HTN, hyperlipidemia, R tibia fracture, L ankle fracture with ORIF    Limitations  Walking    Diagnostic tests  No new lesions seen on MRI    Currently in Pain?  Yes    Pain Score  2     Pain Location  Leg    Pain Orientation  Right    Pain Descriptors / Indicators  Sore    Pain Type  Acute pain    Pain Onset  Yesterday    Pain Frequency  Constant    Aggravating Factors   hyperextension of right LE last night with it locking up    Pain Relieving Factors  lying down in bed for the night with legs bent while on his side              [facility] Adult PT Treatment/Exercise - 10/08/19 0855      Transfers   Transfers  Sit to Stand;Stand to Sit    Sit to Stand  5: Supervision;With upper extremity assist;From bed;From chair/3-in-1    Stand to Sit  5: Supervision;With upper extremity assist;To bed;To chair/3-in-1      Ambulation/Gait   Ambulation/Gait  Yes    Ambulation/Gait Assistance  4: Min  guard;4: Min assist    Ambulation/Gait Assistance Details  cues for posture, to shift weight forward over left LE in stance vs lateral and to keep knees soft. pt with increased extensor tone with last 5 feet as he approached mat table and began to turn needing to stop, relax knees and then continue. Overall was mostly min guard assist for balance with bouts of min assist needed.     Ambulation Distance (Feet)  22 Feet   x1   Assistive device  Straight cane    Gait Pattern  Step-to pattern;Decreased step length - right;Decreased step length - left;Decreased stance time - right;Decreased stance time - left;Decreased stride length;Decreased hip/knee flexion - right;Decreased hip/knee flexion - left;Decreased dorsiflexion - right;Right genu recurvatum;Left genu recurvatum;Lateral trunk lean to left;Poor foot clearance - right    Ambulation Surface  Level;Indoor       Neuro Re-ed    Neuro Re-ed Details   for strengthening/muscle re-ed: in parallel bars- forward BOSU push on floor x1 lap alternating LEs, then 3 laps just left LE pushing, min guard assist rest breaks taken as needed between pushes;  With green band around bil LE's just above the knee- monster walk fwd/bwd, electric slide fwd/bwd, then side stepping in squat position left<>right, all 3 laps each/each way with reminder cues to stay in squat position; bil UE support on bars with min guard assist; In staggered position with bil UE support on bars- weight shifting between the two legs working on hip/knee flexion of right LE (in back) with emphasis on fwd weight shifting over left stance LE, progressed to stepping right foot forward/backward with continued emphasis on fwd/lateral weight shifting over left stance LE, performed this in bloc practice; standing next to mat with RW in front-sustained resistance at pelvis in lateral direction with green band-  Alternating UE raises, bil UE raises for ~8 reps each, then with arms at sides for head movements left<>right, up<>down with EO for ~8 reps each. Min guard assist with cues on posture/soft knees, occasional touch to walker for balance.          PT Short Term Goals - 10/04/19 1806      PT SHORT TERM GOAL #1   Title  Patient will demonstrate independence with updated HEP  (Update STG after Q000111Q - 90 day cert)    Time  4    Period  Weeks    Status  Revised    Target Date  11/03/19      PT SHORT TERM GOAL #2   Title  Pt will improve gait velocity with RW to >/= 1.4 ft/sec    Baseline  .91 ft/sec with RW    Time  4    Period  Weeks    Status  Revised    Target Date  11/03/19      PT SHORT TERM GOAL #3   Title  Pt will improve BERG to >/= 44/56    Baseline  40/56    Time  4    Period  Weeks    Status  Revised    Target Date  11/03/19      PT SHORT TERM GOAL #4   Title  Pt will ambulate x 230' over flat, indoor surfaces with cane with  min A and intermittent standing rest breaks.    Time  4    Period  Weeks    Status  Revised      PT SHORT TERM GOAL #5   Title  Pt will negotate 8 stairs with two rails step alternating sequence with supervision    Time  4    Period  Weeks    Status  Revised    Target Date  11/03/19        PT Long Term Goals - 10/04/19 1811      PT LONG TERM GOAL #1   Title  Pt will demonstrate independence with land HEP    Time  12    Period  Weeks    Status  Revised    Target Date  01/02/20      PT LONG TERM GOAL #2   Title  Pt will improve gait velocity with cane to >/= 0.8 ft/sec    Baseline  Not assessed with cane    Time  12    Period  Weeks    Status  Revised    Target Date  01/02/20      PT LONG TERM GOAL #3   Title  Pt will improve BERG to >/= 46/56 to decrease falls risk    Time  12    Period  Weeks    Status  Revised    Target Date  01/02/20      PT LONG TERM GOAL #4   Title  Pt will ambulate x 200' outside over pavement and grass with supervision and cane to improve safety ambulating to wood working shop at home    Time  12    Period  Weeks    Status  Revised    Target Date  01/02/20      PT LONG TERM GOAL #5   Title  Pt will negotiate 8 stairs with one rail and cane MOD I for safe home entry/exit with cane    Time  12    Period  Weeks    Status  Revised    Target Date  01/02/20            Plan - 10/08/19 0854    Clinical Impression Statement  Today's skilled session continued to address strengthening and gait with straight cane. Pt with overall less assistance needed after working on pregait activities in parallel bars. The pt is progressing toward goals and should benefit from continued PT to progress toward unmet goals.    Comorbidities  multiple falls, vitamin D insufficiency, Type 1 IDDM, diabetic retinopathy, hypothyroidism, HTN, hyperlipidemia, R tibia fracture, L ankle fracture with ORIF    Rehab Potential  Good    PT Frequency  2x / week    PT  Duration  12 weeks    PT Treatment/Interventions  ADLs/Self Care Home Management;Aquatic Therapy;Electrical Stimulation;DME Instruction;Gait training;Stair training;Functional mobility training;Therapeutic activities;Therapeutic exercise;Balance training;Neuromuscular re-education;Patient/family education;Orthotic Fit/Training;Passive range of motion;Energy conservation    PT Next Visit Plan  continue with pregait in staggered stance working on initiating hip/knee flexion for pregait with decreased lateral weight shifting; Gait with cane if have +2; Really needs to work on hip ABD strengthening open and closed chain!  Rockerboard.  Standing resisted hip/knee flexion, walking with resistance (monster walks, pushing BOSU),  Focus on gentle activation of hip and knee flexion in supine/standing/gait to avoid overflow to extensors, gait at counter with cane.  Quadruped or seated core strengthening.  Continue to focus on core/postural control training without UE support in standing, hip flexion and knee flexion strengthening, SLS and weight shift training (rockerboard). tall kneeling for proximal strengthening    PT Home Exercise Plan  Revised HEP on 3/18  Consulted and Agree with Plan of Care  Patient       Patient will benefit from skilled therapeutic intervention in order to improve the following deficits and impairments:  Abnormal gait, Decreased balance, Decreased endurance, Decreased coordination, Decreased range of motion, Decreased strength, Difficulty walking, Impaired tone, Postural dysfunction, Decreased activity tolerance, Decreased mobility  Visit Diagnosis: Other abnormalities of gait and mobility  Muscle weakness (generalized)  Unsteadiness on feet  Difficulty in walking, not elsewhere classified  Foot drop, right     Problem List Patient Active Problem List   Diagnosis Date Noted  . Gait disturbance 08/19/2019  . Acquired diplegia ([place]) 08/19/2019  . Vitamin D insufficiency  11/29/2016  . Hypogonadism in male 11/01/2016  . 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[place])   . Hypothyroidism   . Hypertension   . Hyperlipidemia   . Closed fracture of right fibula and tibia 10/29/2016  . Closed tibia fracture 10/29/2016  . Closed fracture of right tibial plafond with fibula involvement 10/29/2016  . Abnormal liver function tests 03/06/2013  . RASH AND OTHER NONSPECIFIC SKIN ERUPTION 10/07/2009  . SHOULDER PAIN, LEFT 12/27/2008  . Type I (juvenile type) diabetes mellitus without mention of complication, uncontrolled 12/03/2007  . HYPOTHYROIDISM 11/12/2007  . HYPERCHOLESTEROLEMIA 11/12/2007  . MULTIPLE SCLEROSIS 08/29/2007  . PROLIFERATIVE DIABETIC RETINOPATHY 08/29/2007  . HYPERTENSION 12/25/2006    Willow Ora, PTA, [facility] [address] [place], [place] [address] [phone] 10/08/19, 9:55 AM   Name: Johnathan Ross MRN: [id number] Date of Birth: 1970/06/17

## 2019-10-13 ENCOUNTER — Other Ambulatory Visit: Payer: Self-pay

## 2019-10-13 ENCOUNTER — Ambulatory Visit: Payer: Medicare Other | Admitting: Physical Therapy

## 2019-10-13 ENCOUNTER — Encounter: Payer: Self-pay | Admitting: Physical Therapy

## 2019-10-13 DIAGNOSIS — R262 Difficulty in walking, not elsewhere classified: Secondary | ICD-10-CM | POA: Diagnosis not present

## 2019-10-13 DIAGNOSIS — R2689 Other abnormalities of gait and mobility: Secondary | ICD-10-CM | POA: Diagnosis not present

## 2019-10-13 DIAGNOSIS — M21371 Foot drop, right foot: Secondary | ICD-10-CM | POA: Diagnosis not present

## 2019-10-13 DIAGNOSIS — R2681 Unsteadiness on feet: Secondary | ICD-10-CM | POA: Diagnosis not present

## 2019-10-13 DIAGNOSIS — M6281 Muscle weakness (generalized): Secondary | ICD-10-CM | POA: Diagnosis not present

## 2019-10-13 DIAGNOSIS — M21372 Foot drop, left foot: Secondary | ICD-10-CM | POA: Diagnosis not present

## 2019-10-13 NOTE — Therapy (Signed)
[facility] [address] [place], [place], [address] Phone: [phone]   Fax:  [phone]  Physical Therapy Treatment  Patient Details  Name: Johnathan Ross MRN: [id number] Date of Birth: 04/11/1971 Referring Provider (PT): Carylon Perches, MD   Encounter Date: 10/13/2019  PT End of Session - 10/13/19 0854    Visit Number  11    Number of Visits  60    Date for PT Re-Evaluation  01/02/20    Authorization Type  Medicare Part A and B; 10th visit PN    Progress Note Due on Visit  9    PT Start Time  0846    PT Stop Time  0926    PT Time Calculation (min)  40 min    Equipment Utilized During Treatment  Gait belt    Activity Tolerance  Patient tolerated treatment well;Patient limited by fatigue;No increased pain    Behavior During Therapy  WFL for tasks assessed/performed       Past Medical History:  Diagnosis Date  . Hyperlipidemia   . Hypertension   . Hypogonadism in male 11/01/2016  . Hypothyroidism   . Multiple sclerosis ([place])   . Proliferative diabetic retinopathy(362.02)   . Type 1 diabetes mellitus ([place]) dx'd 1994  . Vitamin D insufficiency 11/29/2016    Past Surgical History:  Procedure Laterality Date  . EYE SURGERY Bilateral    "laser for diabetic retinopathy"  . FRACTURE SURGERY    . OPEN REDUCTION INTERNAL FIXATION (ORIF) TIBIA/FIBULA FRACTURE Right 10/30/2016  . ORIF ANKLE FRACTURE Left 2015  . ORIF TIBIA FRACTURE Right 10/30/2016   Procedure: OPEN REDUCTION INTERNAL FIXATION (ORIF) TIBIA FIBULA FRACTURE;  Surgeon: Altamese Time, MD;  Location: [facility];  Service: Orthopedics;  Laterality: Right;  . RETINAL LASER PROCEDURE Bilateral    "for diabetic retinopathy"    There were no vitals filed for this visit.  Subjective Assessment - 10/13/19 0850    Subjective  Still having issues with right knee. Gave away on him this morning with him falling. Fell to the side so not to land on knees. Does not  notice the pain when he is his knees, noticies it more in the evenings.    Patient is accompained by:  Family member   mom in the car   Pertinent History  multiple falls, vitamin D insufficiency, Type 1 IDDM, diabetic retinopathy, hypothyroidism, HTN, hyperlipidemia, R tibia fracture, L ankle fracture with ORIF    Limitations  Walking    Diagnostic tests  No new lesions seen on MRI    Patient Stated Goals  To improve walking    Currently in Pain?  Yes    Pain Score  2     Pain Location  Leg    Pain Orientation  Right    Pain Descriptors / Indicators  Nagging;Tender;Sore    Pain Type  Acute pain    Pain Onset  1 to 4 weeks ago    Pain Frequency  Constant    Aggravating Factors   hyperextension of right knee with it locking out    Pain Relieving Factors  resting           [facility] Adult PT Treatment/Exercise - 10/13/19 0856      Transfers   Transfers  Sit to Stand;Stand to Sit    Sit to Stand  5: Supervision;With upper extremity assist;From bed;From chair/3-in-1    Stand to Sit  5: Supervision;With upper extremity assist;To bed;To chair/3-in-1  Ambulation/Gait   Ambulation/Gait  Yes    Ambulation/Gait Assistance  4: Min guard    Ambulation/Gait Assistance Details  working on maintaining "soft knees" with gait with initiation at hip to decrease extensor tone with gait.     Ambulation Distance (Feet)  60 Feet   x2   Assistive device  Rolling walker    Gait Pattern  Step-to pattern;Decreased step length - right;Decreased step length - left;Decreased stance time - right;Decreased stance time - left;Decreased stride length;Decreased hip/knee flexion - right;Decreased hip/knee flexion - left;Decreased dorsiflexion - right;Right genu recurvatum;Left genu recurvatum;Lateral trunk lean to left;Poor foot clearance - right    Ambulation Surface  Level;Indoor      Neuro Re-ed    Neuro Re-ed Details   for strengthening/muscle re-ed: on red mat next to blue mat table: tall kneeling with 2#  weighted ball- mini squats fo 10 reps, then reaching up over one shoulder/down to opposite side on mat for 10 reps each way; quadruped- alternating LE extension in limited range with foot staying on mat/back into flexion for 10 reps each side. assist needed to keep trunk/pelvis stable during movements; half kneeling- assistance needed at hip/core for stability for pt to do alternating UE raises for 5-6 reps each side; tall kneeling with green band resistance- mini squats for 5 reps with audible right knee popping, stopped due to knee popping. with band pulling in lateral direction and pt having to stabilize against the resistance had pt perform alternating UE raises, bil UE raises, head movements left<>right, then up<>down. min guard assist with cues on posture/weight shifting needed with all ex's.                PT Short Term Goals - 10/04/19 1806      PT SHORT TERM GOAL #1   Title  Patient will demonstrate independence with updated HEP  (Update STG after [id number] - 90 day cert)    Time  4    Period  Weeks    Status  Revised    Target Date  11/03/19      PT SHORT TERM GOAL #2   Title  Pt will improve gait velocity with RW to >/= 1.4 ft/sec    Baseline  .91 ft/sec with RW    Time  4    Period  Weeks    Status  Revised    Target Date  11/03/19      PT SHORT TERM GOAL #3   Title  Pt will improve BERG to >/= 44/56    Baseline  40/56    Time  4    Period  Weeks    Status  Revised    Target Date  11/03/19      PT SHORT TERM GOAL #4   Title  Pt will ambulate x 230' over flat, indoor surfaces with cane with min A and intermittent standing rest breaks.    Time  4    Period  Weeks    Status  Revised      PT SHORT TERM GOAL #5   Title  Pt will negotate 8 stairs with two rails step alternating sequence with supervision    Time  4    Period  Weeks    Status  Revised    Target Date  11/03/19        PT Long Term Goals - 10/04/19 1811      PT LONG TERM GOAL #1   Title  Pt will  demonstrate  independence with land HEP    Time  12    Period  Weeks    Status  Revised    Target Date  01/02/20      PT LONG TERM GOAL #2   Title  Pt will improve gait velocity with cane to >/= 0.8 ft/sec    Baseline  Not assessed with cane    Time  12    Period  Weeks    Status  Revised    Target Date  01/02/20      PT LONG TERM GOAL #3   Title  Pt will improve BERG to >/= 46/56 to decrease falls risk    Time  12    Period  Weeks    Status  Revised    Target Date  01/02/20      PT LONG TERM GOAL #4   Title  Pt will ambulate x 200' outside over pavement and grass with supervision and cane to improve safety ambulating to wood working shop at home    Time  12    Period  Weeks    Status  Revised    Target Date  01/02/20      PT LONG TERM GOAL #5   Title  Pt will negotiate 8 stairs with one rail and cane MOD I for safe home entry/exit with cane    Time  12    Period  Weeks    Status  Revised    Target Date  01/02/20            Plan - 10/13/19 0854    Clinical Impression Statement  Today's skilled session continued to focus on strengthening of LE's and core. Pt with audible "popping" with right knee with tall kneeling green band resisted mini squats (no popping noted with ex's done prior to this), therefore stopped this ex. Due to pt reporting pain, buckling this am and now with popping suggested he follow up with his MD to have the knee checked. Pt plans to call his primary MD today.    Comorbidities  multiple falls, vitamin D insufficiency, Type 1 IDDM, diabetic retinopathy, hypothyroidism, HTN, hyperlipidemia, R tibia fracture, L ankle fracture with ORIF    Rehab Potential  Good    PT Frequency  2x / week    PT Duration  12 weeks    PT Treatment/Interventions  ADLs/Self Care Home Management;Aquatic Therapy;Electrical Stimulation;DME Instruction;Gait training;Stair training;Functional mobility training;Therapeutic activities;Therapeutic exercise;Balance  training;Neuromuscular re-education;Patient/family education;Orthotic Fit/Training;Passive range of motion;Energy conservation    PT Next Visit Plan  continue with pregait in staggered stance working on initiating hip/knee flexion for pregait with decreased lateral weight shifting; Gait with cane if have +2; Really needs to work on hip ABD strengthening open and closed chain!  Rockerboard.  Standing resisted hip/knee flexion, walking with resistance (monster walks, pushing BOSU),  Focus on gentle activation of hip and knee flexion in supine/standing/gait to avoid overflow to extensors, gait at counter with cane.  Quadruped or seated core strengthening.  Continue to focus on core/postural control training without UE support in standing, hip flexion and knee flexion strengthening, SLS and weight shift training (rockerboard). tall kneeling for proximal strengthening    PT Home Exercise Plan  Revised HEP on 3/18    Consulted and Agree with Plan of Care  Patient       Patient will benefit from skilled therapeutic intervention in order to improve the following deficits and impairments:  Abnormal gait, Decreased balance, Decreased endurance, Decreased  coordination, Decreased range of motion, Decreased strength, Difficulty walking, Impaired tone, Postural dysfunction, Decreased activity tolerance, Decreased mobility  Visit Diagnosis: Other abnormalities of gait and mobility  Muscle weakness (generalized)  Unsteadiness on feet  Difficulty in walking, not elsewhere classified     Problem List Patient Active Problem List   Diagnosis Date Noted  . Gait disturbance 08/19/2019  . Acquired diplegia ([place]) 08/19/2019  . Vitamin D insufficiency 11/29/2016  . Hypogonadism in male 11/01/2016  . 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[place])   . Hypothyroidism   . Hypertension   . Hyperlipidemia   . Closed fracture of right fibula and tibia 10/29/2016  . Closed tibia fracture 10/29/2016  .  Closed fracture of right tibial plafond with fibula involvement 10/29/2016  . Abnormal liver function tests 03/06/2013  . RASH AND OTHER NONSPECIFIC SKIN ERUPTION 10/07/2009  . SHOULDER PAIN, LEFT 12/27/2008  . Type I (juvenile type) diabetes mellitus without mention of complication, uncontrolled 12/03/2007  . HYPOTHYROIDISM 11/12/2007  . HYPERCHOLESTEROLEMIA 11/12/2007  . MULTIPLE SCLEROSIS 08/29/2007  . PROLIFERATIVE DIABETIC RETINOPATHY 08/29/2007  . HYPERTENSION 12/25/2006    Willow Ora, PTA, [facility] [address] [place], [place] [address] [phone] 10/13/19, 4:06 PM   Name: Johnathan Ross MRN: [id number] Date of Birth: 09-18-1970

## 2019-10-15 ENCOUNTER — Other Ambulatory Visit: Payer: Self-pay

## 2019-10-15 ENCOUNTER — Ambulatory Visit: Payer: Medicare Other | Admitting: Physical Therapy

## 2019-10-15 ENCOUNTER — Encounter: Payer: Self-pay | Admitting: Physical Therapy

## 2019-10-15 DIAGNOSIS — R2689 Other abnormalities of gait and mobility: Secondary | ICD-10-CM | POA: Diagnosis not present

## 2019-10-15 DIAGNOSIS — R2681 Unsteadiness on feet: Secondary | ICD-10-CM | POA: Diagnosis not present

## 2019-10-15 DIAGNOSIS — M21372 Foot drop, left foot: Secondary | ICD-10-CM | POA: Diagnosis not present

## 2019-10-15 DIAGNOSIS — M21371 Foot drop, right foot: Secondary | ICD-10-CM | POA: Diagnosis not present

## 2019-10-15 DIAGNOSIS — M6281 Muscle weakness (generalized): Secondary | ICD-10-CM | POA: Diagnosis not present

## 2019-10-15 DIAGNOSIS — R296 Repeated falls: Secondary | ICD-10-CM

## 2019-10-15 DIAGNOSIS — R262 Difficulty in walking, not elsewhere classified: Secondary | ICD-10-CM | POA: Diagnosis not present

## 2019-10-15 NOTE — Therapy (Signed)
[facility] [address] [place], [place], [address] Phone: [phone]   Fax:  [phone]  Physical Therapy Treatment  Patient Details  Name: Johnathan Ross MRN: [id number] Date of Birth: [birth year] Referring Provider (PT): Carylon Perches, MD   Encounter Date: 10/15/2019  PT End of Session - 10/15/19 0851    Visit Number  49    Number of Visits  42    Date for PT Re-Evaluation  01/02/20    Authorization Type  Medicare Part A and B; 10th visit PN    Progress Note Due on Visit  18    PT Start Time  0846    PT Stop Time  0930    PT Time Calculation (min)  44 min    Equipment Utilized During Treatment  Gait belt    Activity Tolerance  Patient tolerated treatment well;Patient limited by fatigue;No increased pain    Behavior During Therapy  WFL for tasks assessed/performed       Past Medical History:  Diagnosis Date  . Hyperlipidemia   . Hypertension   . Hypogonadism in male 11/01/2016  . Hypothyroidism   . Multiple sclerosis ([place])   . Proliferative diabetic retinopathy(362.02)   . Type 1 diabetes mellitus ([place]) dx'd 1994  . Vitamin D insufficiency 11/29/2016    Past Surgical History:  Procedure Laterality Date  . EYE SURGERY Bilateral    "laser for diabetic retinopathy"  . FRACTURE SURGERY    . OPEN REDUCTION INTERNAL FIXATION (ORIF) TIBIA/FIBULA FRACTURE Right 10/30/2016  . ORIF ANKLE FRACTURE Left 2015  . ORIF TIBIA FRACTURE Right 10/30/2016   Procedure: OPEN REDUCTION INTERNAL FIXATION (ORIF) TIBIA FIBULA FRACTURE;  Surgeon: Altamese Newhall, MD;  Location: [facility];  Service: Orthopedics;  Laterality: Right;  . RETINAL LASER PROCEDURE Bilateral    "for diabetic retinopathy"    There were no vitals filed for this visit.  Subjective Assessment - 10/15/19 0850    Subjective  No new falls. No popping of knee until just now with walking into the gym. No pain except for "just now when it popped". Has not called  the MD about knee as of yet.    Patient is accompained by:  Family member   mom in car   Pertinent History  multiple falls, vitamin D insufficiency, Type 1 IDDM, diabetic retinopathy, hypothyroidism, HTN, hyperlipidemia, R tibia fracture, L ankle fracture with ORIF    Limitations  Walking    Diagnostic tests  No new lesions seen on MRI    Patient Stated Goals  To improve walking    Currently in Pain?  No/denies    Pain Score  0-No pain           [facility] Adult PT Treatment/Exercise - 10/15/19 0853      Transfers   Transfers  Sit to Stand;Stand to Sit    Sit to Stand  5: Supervision;With upper extremity assist;From bed;From chair/3-in-1    Stand to Sit  5: Supervision;With upper extremity assist;To bed;To chair/3-in-1      Ambulation/Gait   Ambulation/Gait  Yes    Ambulation/Gait Assistance  4: Min guard    Ambulation/Gait Assistance Details  continued to emphasize "soft knees" with slow initiation at hip to decrease extensor tone with swing phase. several rest breaks needed with both gait reps.     Ambulation Distance (Feet)  80 Feet   x2   Assistive device  Rolling walker    Gait Pattern  Step-to pattern;Decreased step  length - right;Decreased step length - left;Decreased stance time - right;Decreased stance time - left;Decreased stride length;Decreased hip/knee flexion - right;Decreased hip/knee flexion - left;Decreased dorsiflexion - right;Right genu recurvatum;Left genu recurvatum;Lateral trunk lean to left;Poor foot clearance - right    Ambulation Surface  Level;Indoor      Neuro Re-ed    Neuro Re-ed Details   for strengthening/muscle re-ed: in parallel bars- alternating LE's pushing BOSU forward for 4 laps with bil UE support, min guard assist with cues on form/technique; lateral pushing of BOSU for 2 laps each way with bil UE support. assist needed to keep upper trunk from rotation which was allowing for compensation; with green band around bil LE's just above knees- side  stepping let<>right in squat position, monster walk fwd/bwd and electric slide fwd/bwd x 3 laps each/each way with min guard assist, cues on form/technique with bil UE support. rest breaks taken between each ex due to fatigue.           PT Short Term Goals - 10/04/19 1806      PT SHORT TERM GOAL #1   Title  Patient will demonstrate independence with updated HEP  (Update STG after Q000111Q - 90 day cert)    Time  4    Period  Weeks    Status  Revised    Target Date  11/03/19      PT SHORT TERM GOAL #2   Title  Pt will improve gait velocity with RW to >/= 1.4 ft/sec    Baseline  .91 ft/sec with RW    Time  4    Period  Weeks    Status  Revised    Target Date  11/03/19      PT SHORT TERM GOAL #3   Title  Pt will improve BERG to >/= 44/56    Baseline  40/56    Time  4    Period  Weeks    Status  Revised    Target Date  11/03/19      PT SHORT TERM GOAL #4   Title  Pt will ambulate x 230' over flat, indoor surfaces with cane with min A and intermittent standing rest breaks.    Time  4    Period  Weeks    Status  Revised      PT SHORT TERM GOAL #5   Title  Pt will negotate 8 stairs with two rails step alternating sequence with supervision    Time  4    Period  Weeks    Status  Revised    Target Date  11/03/19        PT Long Term Goals - 10/04/19 1811      PT LONG TERM GOAL #1   Title  Pt will demonstrate independence with land HEP    Time  12    Period  Weeks    Status  Revised    Target Date  01/02/20      PT LONG TERM GOAL #2   Title  Pt will improve gait velocity with cane to >/= 0.8 ft/sec    Baseline  Not assessed with cane    Time  12    Period  Weeks    Status  Revised    Target Date  01/02/20      PT LONG TERM GOAL #3   Title  Pt will improve BERG to >/= 46/56 to decrease falls risk    Time  12  Period  Weeks    Status  Revised    Target Date  01/02/20      PT LONG TERM GOAL #4   Title  Pt will ambulate x 200' outside over pavement and grass  with supervision and cane to improve safety ambulating to wood working shop at home    Time  12    Period  Weeks    Status  Revised    Target Date  01/02/20      PT LONG TERM GOAL #5   Title  Pt will negotiate 8 stairs with one rail and cane MOD I for safe home entry/exit with cane    Time  12    Period  Weeks    Status  Revised    Target Date  01/02/20            Plan - 10/15/19 [id number]    Clinical Impression Statement  Today's skilled session continued to focus on strengthening and gait mechanics with emphasis on keeping knees soft/initiating at hip slowly. Rest breaks taken throughout session due to fatigue. No knee pain or additional popping occured with remainder of session. The pt is progressing toward goals and should benefit from continued PT to progress toward unmet goals.    Comorbidities  multiple falls, vitamin D insufficiency, Type 1 IDDM, diabetic retinopathy, hypothyroidism, HTN, hyperlipidemia, R tibia fracture, L ankle fracture with ORIF    Rehab Potential  Good    PT Frequency  2x / week    PT Duration  12 weeks    PT Treatment/Interventions  ADLs/Self Care Home Management;Aquatic Therapy;Electrical Stimulation;DME Instruction;Gait training;Stair training;Functional mobility training;Therapeutic activities;Therapeutic exercise;Balance training;Neuromuscular re-education;Patient/family education;Orthotic Fit/Training;Passive range of motion;Energy conservation    PT Next Visit Plan  continue with pregait in staggered stance working on initiating hip/knee flexion for pregait with decreased lateral weight shifting; Gait with cane if have +2; Really needs to work on hip ABD strengthening open and closed chain!  Rockerboard.  Standing resisted hip/knee flexion, walking with resistance (monster walks, pushing BOSU),  Focus on gentle activation of hip and knee flexion in supine/standing/gait to avoid overflow to extensors, gait at counter with cane.  Quadruped or seated core  strengthening.  Continue to focus on core/postural control training without UE support in standing, hip flexion and knee flexion strengthening, SLS and weight shift training (rockerboard). tall kneeling for proximal strengthening    PT Home Exercise Plan  Revised HEP on 3/18    Consulted and Agree with Plan of Care  Patient       Patient will benefit from skilled therapeutic intervention in order to improve the following deficits and impairments:  Abnormal gait, Decreased balance, Decreased endurance, Decreased coordination, Decreased range of motion, Decreased strength, Difficulty walking, Impaired tone, Postural dysfunction, Decreased activity tolerance, Decreased mobility  Visit Diagnosis: Other abnormalities of gait and mobility  Muscle weakness (generalized)  Unsteadiness on feet  Difficulty in walking, not elsewhere classified  Foot drop, right  Repeated falls     Problem List Patient Active Problem List   Diagnosis Date Noted  . Gait disturbance 08/19/2019  . Acquired diplegia ([place]) 08/19/2019  . Vitamin D insufficiency 11/29/2016  . Hypogonadism in male 11/01/2016  . 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[place])   . Hypothyroidism   . Hypertension   . Hyperlipidemia   . Closed fracture of right fibula and tibia 10/29/2016  . Closed tibia fracture 10/29/2016  . Closed fracture of right tibial plafond with fibula involvement  10/29/2016  . Abnormal liver function tests 03/06/2013  . RASH AND OTHER NONSPECIFIC SKIN ERUPTION 10/07/2009  . SHOULDER PAIN, LEFT 12/27/2008  . Type I (juvenile type) diabetes mellitus without mention of complication, uncontrolled 12/03/2007  . HYPOTHYROIDISM 11/12/2007  . HYPERCHOLESTEROLEMIA 11/12/2007  . MULTIPLE SCLEROSIS 08/29/2007  . PROLIFERATIVE DIABETIC RETINOPATHY 08/29/2007  . HYPERTENSION 12/25/2006   Willow Ora, PTA, [facility] [address] [place], [place]  [address] [phone] 10/15/19, 7:23 PM   Name: Johnathan Ross MRN: [id number] Date of Birth: Dec 12, 1970

## 2019-10-21 ENCOUNTER — Ambulatory Visit: Payer: Medicare Other | Admitting: Physical Therapy

## 2019-10-21 ENCOUNTER — Other Ambulatory Visit: Payer: Self-pay

## 2019-10-21 ENCOUNTER — Encounter: Payer: Self-pay | Admitting: Physical Therapy

## 2019-10-21 DIAGNOSIS — M21372 Foot drop, left foot: Secondary | ICD-10-CM | POA: Diagnosis not present

## 2019-10-21 DIAGNOSIS — M6281 Muscle weakness (generalized): Secondary | ICD-10-CM

## 2019-10-21 DIAGNOSIS — R2689 Other abnormalities of gait and mobility: Secondary | ICD-10-CM

## 2019-10-21 DIAGNOSIS — R2681 Unsteadiness on feet: Secondary | ICD-10-CM

## 2019-10-21 DIAGNOSIS — R262 Difficulty in walking, not elsewhere classified: Secondary | ICD-10-CM

## 2019-10-21 DIAGNOSIS — M21371 Foot drop, right foot: Secondary | ICD-10-CM | POA: Diagnosis not present

## 2019-10-21 NOTE — Therapy (Signed)
[facility] [address] [place], [place], [address] Phone: [phone]   Fax:  [phone]  Physical Therapy Treatment  Patient Details  Name: Johnathan Ross MRN: [id number] Date of Birth: 06/09/1970 Referring Provider (PT): Carylon Perches, MD   Encounter Date: 10/21/2019  PT End of Session - 10/21/19 0850    Visit Number  43    Number of Visits  61    Date for PT Re-Evaluation  01/02/20    Authorization Type  Medicare Part A and B; 10th visit PN    Progress Note Due on Visit  25    PT Start Time  0845    PT Stop Time  0929    PT Time Calculation (min)  44 min    Equipment Utilized During Treatment  Gait belt    Activity Tolerance  Patient tolerated treatment well;Patient limited by fatigue;No increased pain    Behavior During Therapy  WFL for tasks assessed/performed       Past Medical History:  Diagnosis Date  . Hyperlipidemia   . Hypertension   . Hypogonadism in male 11/01/2016  . Hypothyroidism   . Multiple sclerosis ([place])   . Proliferative diabetic retinopathy(362.02)   . Type 1 diabetes mellitus ([place]) dx'd 1994  . Vitamin D insufficiency 11/29/2016    Past Surgical History:  Procedure Laterality Date  . EYE SURGERY Bilateral    "laser for diabetic retinopathy"  . FRACTURE SURGERY    . OPEN REDUCTION INTERNAL FIXATION (ORIF) TIBIA/FIBULA FRACTURE Right 10/30/2016  . ORIF ANKLE FRACTURE Left 2015  . ORIF TIBIA FRACTURE Right 10/30/2016   Procedure: OPEN REDUCTION INTERNAL FIXATION (ORIF) TIBIA FIBULA FRACTURE;  Surgeon: Altamese Woodlake, MD;  Location: [facility];  Service: Orthopedics;  Laterality: Right;  . RETINAL LASER PROCEDURE Bilateral    "for diabetic retinopathy"    There were no vitals filed for this visit.  Subjective Assessment - 10/21/19 0849    Subjective  No new falls or episodes of knee buckling. Does still report knee popping, no pain with it. Has not called the MD.    Patient is  accompained by:  Family member   mom in car   Pertinent History  multiple falls, vitamin D insufficiency, Type 1 IDDM, diabetic retinopathy, hypothyroidism, HTN, hyperlipidemia, R tibia fracture, L ankle fracture with ORIF    Diagnostic tests  No new lesions seen on MRI    Patient Stated Goals  To improve walking    Pain Score  0-No pain                [facility] Adult PT Treatment/Exercise - 10/21/19 0851      Ambulation/Gait   Ambulation/Gait  Yes    Ambulation/Gait Assistance  4: Min guard    Ambulation/Gait Assistance Details  reminder cues for soft knees and to slow down swing phase of gait for decreased extensor tone kicking in.     Ambulation Distance (Feet)  60 Feet   x2, 80 x1   Assistive device  Rolling walker    Gait Pattern  Step-to pattern;Decreased step length - right;Decreased step length - left;Decreased stance time - right;Decreased stance time - left;Decreased stride length;Decreased hip/knee flexion - right;Decreased hip/knee flexion - left;Decreased dorsiflexion - right;Right genu recurvatum;Left genu recurvatum;Lateral trunk lean to left;Poor foot clearance - right    Ambulation Surface  Level;Indoor      Neuro Re-ed    Neuro Re-ed Details   for strengthening/muscle re-ed: in half kneeling  on red mat- alternating UE raises for ~10 reps each side with min assist/facilitation for posture/positioning and weight shifting for balance; in tall kneeling on red mat- with green band resisted pull in lateral direction- alternating UE raises, bil UE raises, head movements left<>right, head movements up<>down for ~8-10 reps each. performed in both ways with rest breaks taken as needed.            Balance Exercises - 10/21/19 1334      Balance Exercises: Standing   Rockerboard  Anterior/posterior;Lateral;EO;10 reps;Limitations    Rockerboard Limitations  performed both ways on balance board-holding the board steady for alternating UE raises, then bil UE raises. min guard  to min assist for balance with cues on soft knees and weight shifting/posture to assist with balance.          PT Short Term Goals - 10/04/19 1806      PT SHORT TERM GOAL #1   Title  Patient will demonstrate independence with updated HEP  (Update STG after [id number] - 90 day cert)    Time  4    Period  Weeks    Status  Revised    Target Date  11/03/19      PT SHORT TERM GOAL #2   Title  Pt will improve gait velocity with RW to >/= 1.4 ft/sec    Baseline  .91 ft/sec with RW    Time  4    Period  Weeks    Status  Revised    Target Date  11/03/19      PT SHORT TERM GOAL #3   Title  Pt will improve BERG to >/= 44/56    Baseline  40/56    Time  4    Period  Weeks    Status  Revised    Target Date  11/03/19      PT SHORT TERM GOAL #4   Title  Pt will ambulate x 230' over flat, indoor surfaces with cane with min A and intermittent standing rest breaks.    Time  4    Period  Weeks    Status  Revised      PT SHORT TERM GOAL #5   Title  Pt will negotate 8 stairs with two rails step alternating sequence with supervision    Time  4    Period  Weeks    Status  Revised    Target Date  11/03/19        PT Long Term Goals - 10/04/19 1811      PT LONG TERM GOAL #1   Title  Pt will demonstrate independence with land HEP    Time  12    Period  Weeks    Status  Revised    Target Date  01/02/20      PT LONG TERM GOAL #2   Title  Pt will improve gait velocity with cane to >/= 0.8 ft/sec    Baseline  Not assessed with cane    Time  12    Period  Weeks    Status  Revised    Target Date  01/02/20      PT LONG TERM GOAL #3   Title  Pt will improve BERG to >/= 46/56 to decrease falls risk    Time  12    Period  Weeks    Status  Revised    Target Date  01/02/20      PT LONG TERM GOAL #4  Title  Pt will ambulate x 200' outside over pavement and grass with supervision and cane to improve safety ambulating to wood working shop at home    Time  12    Period  Weeks    Status   Revised    Target Date  01/02/20      PT LONG TERM GOAL #5   Title  Pt will negotiate 8 stairs with one rail and cane MOD I for safe home entry/exit with cane    Time  12    Period  Weeks    Status  Revised    Target Date  01/02/20            Plan - 10/21/19 0850    Clinical Impression Statement  Today's skilled session continued to focus on core/hip stabiity and strengthening with no issues reported with session. Pt did state knee popped about 2 times with no pain. Also continued to address gait mechanics this session with no knee issues reported or noted in session. The pt continues to make steady progress and should benefit from continued PT to progress toward unmet goals    Comorbidities  multiple falls, vitamin D insufficiency, Type 1 IDDM, diabetic retinopathy, hypothyroidism, HTN, hyperlipidemia, R tibia fracture, L ankle fracture with ORIF    Rehab Potential  Good    PT Frequency  2x / week    PT Duration  12 weeks    PT Treatment/Interventions  ADLs/Self Care Home Management;Aquatic Therapy;Electrical Stimulation;DME Instruction;Gait training;Stair training;Functional mobility training;Therapeutic activities;Therapeutic exercise;Balance training;Neuromuscular re-education;Patient/family education;Orthotic Fit/Training;Passive range of motion;Energy conservation    PT Next Visit Plan  continue with pregait in staggered stance working on initiating hip/knee flexion for pregait with decreased lateral weight shifting; Gait with cane if have +2; Really needs to work on hip ABD strengthening open and closed chain!  Rockerboard.  Standing resisted hip/knee flexion, walking with resistance (monster walks, pushing BOSU),  Focus on gentle activation of hip and knee flexion in supine/standing/gait to avoid overflow to extensors, gait at counter with cane.  Quadruped or seated core strengthening.  Continue to focus on core/postural control training without UE support in standing, hip flexion and  knee flexion strengthening, SLS and weight shift training (rockerboard). tall kneeling for proximal strengthening    PT Home Exercise Plan  Revised HEP on 3/18    Consulted and Agree with Plan of Care  Patient       Patient will benefit from skilled therapeutic intervention in order to improve the following deficits and impairments:  Abnormal gait, Decreased balance, Decreased endurance, Decreased coordination, Decreased range of motion, Decreased strength, Difficulty walking, Impaired tone, Postural dysfunction, Decreased activity tolerance, Decreased mobility  Visit Diagnosis: Other abnormalities of gait and mobility  Muscle weakness (generalized)  Unsteadiness on feet  Difficulty in walking, not elsewhere classified  Foot drop, right     Problem List Patient Active Problem List   Diagnosis Date Noted  . Gait disturbance 08/19/2019  . Acquired diplegia ([place]) 08/19/2019  . Vitamin D insufficiency 11/29/2016  . Hypogonadism in male 11/01/2016  . 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[place])   . Hypothyroidism   . Hypertension   . Hyperlipidemia   . Closed fracture of right fibula and tibia 10/29/2016  . Closed tibia fracture 10/29/2016  . Closed fracture of right tibial plafond with fibula involvement 10/29/2016  . Abnormal liver function tests 03/06/2013  . RASH AND OTHER NONSPECIFIC SKIN ERUPTION 10/07/2009  . SHOULDER PAIN, LEFT 12/27/2008  .  Type I (juvenile type) diabetes mellitus without mention of complication, uncontrolled 12/03/2007  . HYPOTHYROIDISM 11/12/2007  . HYPERCHOLESTEROLEMIA 11/12/2007  . MULTIPLE SCLEROSIS 08/29/2007  . PROLIFERATIVE DIABETIC RETINOPATHY 08/29/2007  . HYPERTENSION 12/25/2006    Willow Ora, PTA, [facility] [address], [place], [place] [address] [phone] 10/21/19, 1:53 PM   Name: Johnathan Ross MRN: [id number] Date of Birth: 09/10/1970

## 2019-10-23 ENCOUNTER — Ambulatory Visit: Payer: Medicare Other | Admitting: Physical Therapy

## 2019-10-23 ENCOUNTER — Other Ambulatory Visit: Payer: Self-pay

## 2019-10-23 ENCOUNTER — Encounter: Payer: Self-pay | Admitting: Physical Therapy

## 2019-10-23 DIAGNOSIS — R2681 Unsteadiness on feet: Secondary | ICD-10-CM | POA: Diagnosis not present

## 2019-10-23 DIAGNOSIS — M6281 Muscle weakness (generalized): Secondary | ICD-10-CM

## 2019-10-23 DIAGNOSIS — M21371 Foot drop, right foot: Secondary | ICD-10-CM

## 2019-10-23 DIAGNOSIS — R262 Difficulty in walking, not elsewhere classified: Secondary | ICD-10-CM | POA: Diagnosis not present

## 2019-10-23 DIAGNOSIS — R2689 Other abnormalities of gait and mobility: Secondary | ICD-10-CM | POA: Diagnosis not present

## 2019-10-23 DIAGNOSIS — M21372 Foot drop, left foot: Secondary | ICD-10-CM | POA: Diagnosis not present

## 2019-10-23 NOTE — Therapy (Signed)
[facility] [address] [place], [place], [address] Phone: [phone]   Fax:  [phone]  Physical Therapy Treatment  Patient Details  Name: Johnathan Ross MRN: [id number] Date of Birth: June 01, 1970 Referring Provider (PT): Carylon Perches, MD   Encounter Date: 10/23/2019  PT End of Session - 10/23/19 0852    Visit Number  89    Number of Visits  9    Date for PT Re-Evaluation  01/02/20    Authorization Type  Medicare Part A and B; 10th visit PN    Progress Note Due on Visit  35    PT Start Time  0846    PT Stop Time  0928    PT Time Calculation (min)  42 min    Equipment Utilized During Treatment  Gait belt    Activity Tolerance  Patient tolerated treatment well;Patient limited by fatigue;No increased pain    Behavior During Therapy  WFL for tasks assessed/performed       Past Medical History:  Diagnosis Date  . Hyperlipidemia   . Hypertension   . Hypogonadism in male 11/01/2016  . Hypothyroidism   . Multiple sclerosis ([place])   . Proliferative diabetic retinopathy(362.02)   . Type 1 diabetes mellitus ([place]) dx'd 1994  . Vitamin D insufficiency 11/29/2016    Past Surgical History:  Procedure Laterality Date  . EYE SURGERY Bilateral    "laser for diabetic retinopathy"  . FRACTURE SURGERY    . OPEN REDUCTION INTERNAL FIXATION (ORIF) TIBIA/FIBULA FRACTURE Right 10/30/2016  . ORIF ANKLE FRACTURE Left 2015  . ORIF TIBIA FRACTURE Right 10/30/2016   Procedure: OPEN REDUCTION INTERNAL FIXATION (ORIF) TIBIA FIBULA FRACTURE;  Surgeon: Altamese Jakin, MD;  Location: [facility];  Service: Orthopedics;  Laterality: Right;  . RETINAL LASER PROCEDURE Bilateral    "for diabetic retinopathy"    There were no vitals filed for this visit.  Subjective Assessment - 10/23/19 0850    Subjective  No falls or pain currently. Does still have pain in right knee when he lies on it or puts pressure on medial aspect of the knee. Does  on occasion still have popping with bending.    Patient is accompained by:  Family member   mom in car   Pertinent History  multiple falls, vitamin D insufficiency, Type 1 IDDM, diabetic retinopathy, hypothyroidism, HTN, hyperlipidemia, R tibia fracture, L ankle fracture with ORIF    Limitations  Walking    Diagnostic tests  No new lesions seen on MRI    Patient Stated Goals  To improve walking    Currently in Pain?  No/denies    Pain Score  0-No pain           [facility] Adult PT Treatment/Exercise - 10/23/19 [id number]      Transfers   Transfers  Sit to Stand;Stand to Sit    Sit to Stand  5: Supervision;With upper extremity assist;From bed;From chair/3-in-1    Stand to Sit  5: Supervision;With upper extremity assist;To bed;To chair/3-in-1      Ambulation/Gait   Ambulation/Gait  Yes    Ambulation/Gait Assistance  4: Min guard    Ambulation/Gait Assistance Details  continuing to focus on soft knees with initiation from hip with swing phase to decrease extensor tone. pt able to take up to 8 steps at a time before needing to rest and "reset" due to tone.     Ambulation Distance (Feet)  60 Feet   x2, 40 x2  Assistive device  Rolling walker    Gait Pattern  Step-to pattern;Decreased step length - right;Decreased step length - left;Decreased stance time - right;Decreased stance time - left;Decreased stride length;Decreased hip/knee flexion - right;Decreased hip/knee flexion - left;Decreased dorsiflexion - right;Right genu recurvatum;Left genu recurvatum;Lateral trunk lean to left;Poor foot clearance - right    Ambulation Surface  Level;Indoor      Neuro Re-ed    Neuro Re-ed Details   for strengthening/muscle re-ed: in parallel bars with UE support on bars- green band around LE"s above knees- side stepping in squat position, electric slide fwd/bwd, then cowboy walk fwd/bwd, all for 3 laps each with rest breaks as needed, cues to keep squat position and for posture;  with red band around ankle of pt  and PTA-  resisted forward stepping with graded resistance in bloc practice on both LE's working on weight shifting fwd over stance LE with less lateral weight shifting and emphasis on hip/knee flexion.           Balance Exercises - 10/23/19 0920      Balance Exercises: Standing   Standing Eyes Closed  Wide (BOA);Solid surface;3 reps;30 secs;Limitations    Standing Eyes Closed Limitations  next to mat table with RW in front for safety with feet wide apart. min guard to min assist for balance.           PT Short Term Goals - 10/04/19 1806      PT SHORT TERM GOAL #1   Title  Patient will demonstrate independence with updated HEP  (Update STG after Q000111Q - 90 day cert)    Time  4    Period  Weeks    Status  Revised    Target Date  11/03/19      PT SHORT TERM GOAL #2   Title  Pt will improve gait velocity with RW to >/= 1.4 ft/sec    Baseline  .91 ft/sec with RW    Time  4    Period  Weeks    Status  Revised    Target Date  11/03/19      PT SHORT TERM GOAL #3   Title  Pt will improve BERG to >/= 44/56    Baseline  40/56    Time  4    Period  Weeks    Status  Revised    Target Date  11/03/19      PT SHORT TERM GOAL #4   Title  Pt will ambulate x 230' over flat, indoor surfaces with cane with min A and intermittent standing rest breaks.    Time  4    Period  Weeks    Status  Revised      PT SHORT TERM GOAL #5   Title  Pt will negotate 8 stairs with two rails step alternating sequence with supervision    Time  4    Period  Weeks    Status  Revised    Target Date  11/03/19        PT Long Term Goals - 10/04/19 1811      PT LONG TERM GOAL #1   Title  Pt will demonstrate independence with land HEP    Time  12    Period  Weeks    Status  Revised    Target Date  01/02/20      PT LONG TERM GOAL #2   Title  Pt will improve gait velocity with cane to >/= 0.8 ft/sec  Baseline  Not assessed with cane    Time  12    Period  Weeks    Status  Revised    Target  Date  01/02/20      PT LONG TERM GOAL #3   Title  Pt will improve BERG to >/= 46/56 to decrease falls risk    Time  12    Period  Weeks    Status  Revised    Target Date  01/02/20      PT LONG TERM GOAL #4   Title  Pt will ambulate x 200' outside over pavement and grass with supervision and cane to improve safety ambulating to wood working shop at home    Time  12    Period  Weeks    Status  Revised    Target Date  01/02/20      PT LONG TERM GOAL #5   Title  Pt will negotiate 8 stairs with one rail and cane MOD I for safe home entry/exit with cane    Time  12    Period  Weeks    Status  Revised    Target Date  01/02/20            Plan - 10/23/19 [id number]    Clinical Impression Statement  Today's skilled session continued to focus on LE/core strengthening, gait mechanics and began to address static balance with vision removed. Up to min assist needed at times. Rest breaks taken throughout session as needed with no other issues noted or reported. The pt is progressing toward goals and should benefit from continued PT to progress toward unmet goals.    Comorbidities  multiple falls, vitamin D insufficiency, Type 1 IDDM, diabetic retinopathy, hypothyroidism, HTN, hyperlipidemia, R tibia fracture, L ankle fracture with ORIF    Rehab Potential  Good    PT Frequency  2x / week    PT Duration  12 weeks    PT Treatment/Interventions  ADLs/Self Care Home Management;Aquatic Therapy;Electrical Stimulation;DME Instruction;Gait training;Stair training;Functional mobility training;Therapeutic activities;Therapeutic exercise;Balance training;Neuromuscular re-education;Patient/family education;Orthotic Fit/Training;Passive range of motion;Energy conservation    PT Next Visit Plan  continue with pregait in staggered stance working on initiating hip/knee flexion for pregait with decreased lateral weight shifting with and without resistance; Gait with cane if have +2; Really needs to work on hip ABD  strengthening open and closed chain!  Rockerboard.  Standing resisted hip/knee flexion, walking with resistance (monster walks, pushing BOSU),  Focus on gentle activation of hip and knee flexion in supine/standing/gait to avoid overflow to extensors, gait at counter with cane.  Quadruped or seated core strengthening.  Continue to focus on core/postural control training without UE support in standing, hip flexion and knee flexion strengthening, SLS and weight shift training (rockerboard). tall kneeling for proximal strengthening    PT Home Exercise Plan  Revised HEP on 3/18    Consulted and Agree with Plan of Care  Patient       Patient will benefit from skilled therapeutic intervention in order to improve the following deficits and impairments:  Abnormal gait, Decreased balance, Decreased endurance, Decreased coordination, Decreased range of motion, Decreased strength, Difficulty walking, Impaired tone, Postural dysfunction, Decreased activity tolerance, Decreased mobility  Visit Diagnosis: Other abnormalities of gait and mobility  Muscle weakness (generalized)  Unsteadiness on feet  Difficulty in walking, not elsewhere classified  Foot drop, right     Problem List Patient Active Problem List   Diagnosis Date Noted  . Gait disturbance 08/19/2019  .  Acquired diplegia ([place]) 08/19/2019  . Vitamin D insufficiency 11/29/2016  . Hypogonadism in male 11/01/2016  . 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[place])   . Hypothyroidism   . Hypertension   . Hyperlipidemia   . Closed fracture of right fibula and tibia 10/29/2016  . Closed tibia fracture 10/29/2016  . Closed fracture of right tibial plafond with fibula involvement 10/29/2016  . Abnormal liver function tests 03/06/2013  . RASH AND OTHER NONSPECIFIC SKIN ERUPTION 10/07/2009  . SHOULDER PAIN, LEFT 12/27/2008  . Type I (juvenile type) diabetes mellitus without mention of complication, uncontrolled 12/03/2007  .  HYPOTHYROIDISM 11/12/2007  . HYPERCHOLESTEROLEMIA 11/12/2007  . MULTIPLE SCLEROSIS 08/29/2007  . PROLIFERATIVE DIABETIC RETINOPATHY 08/29/2007  . HYPERTENSION 12/25/2006    Willow Ora, PTA, [facility] [address], [place], [place] [address] [phone] 10/23/19, 1:28 PM   Name: Johnathan Ross MRN: [id number] Date of Birth: [birth year]

## 2019-10-28 ENCOUNTER — Ambulatory Visit: Payer: Medicare Other | Admitting: Physical Therapy

## 2019-10-30 ENCOUNTER — Other Ambulatory Visit: Payer: Self-pay

## 2019-10-30 ENCOUNTER — Encounter: Payer: Self-pay | Admitting: Physical Therapy

## 2019-10-30 ENCOUNTER — Ambulatory Visit: Payer: Medicare Other | Attending: Physician Assistant | Admitting: Physical Therapy

## 2019-10-30 DIAGNOSIS — M21371 Foot drop, right foot: Secondary | ICD-10-CM

## 2019-10-30 DIAGNOSIS — R2689 Other abnormalities of gait and mobility: Secondary | ICD-10-CM | POA: Diagnosis not present

## 2019-10-30 DIAGNOSIS — R296 Repeated falls: Secondary | ICD-10-CM | POA: Insufficient documentation

## 2019-10-30 DIAGNOSIS — M6281 Muscle weakness (generalized): Secondary | ICD-10-CM

## 2019-10-30 DIAGNOSIS — R262 Difficulty in walking, not elsewhere classified: Secondary | ICD-10-CM | POA: Diagnosis not present

## 2019-10-30 DIAGNOSIS — R2681 Unsteadiness on feet: Secondary | ICD-10-CM

## 2019-10-30 NOTE — Therapy (Addendum)
[facility] [address] [place], [place], [address] Phone: [phone]   Fax:  [phone]  Physical Therapy Treatment and 10th visit Progress Note  Patient Details  Name: Johnathan Ross MRN: [id number] Date of Birth: 1971/03/13 Referring Provider (PT): Carylon Perches, MD   Encounter Date: 10/30/2019   Progress Note Reporting Period 09/23/19 to 10/30/19  See note below for Objective Data and Assessment of Progress/Goals.   [name], PT, DPT 11/03/19    2:08 PM     PT End of Session - 10/30/19 0854    Visit Number  60    Number of Visits  77    Date for PT Re-Evaluation  01/02/20    Authorization Type  Medicare Part A and B; 10th visit PN    Progress Note Due on Visit  36    PT Start Time  0847    PT Stop Time  0930    PT Time Calculation (min)  43 min    Equipment Utilized During Treatment  Gait belt    Activity Tolerance  Patient tolerated treatment well;Patient limited by fatigue;No increased pain    Behavior During Therapy  WFL for tasks assessed/performed       Past Medical History:  Diagnosis Date  . Hyperlipidemia   . Hypertension   . Hypogonadism in male 11/01/2016  . Hypothyroidism   . Multiple sclerosis ([place])   . Proliferative diabetic retinopathy(362.02)   . Type 1 diabetes mellitus ([place]) dx'd 1994  . Vitamin D insufficiency 11/29/2016    Past Surgical History:  Procedure Laterality Date  . EYE SURGERY Bilateral    "laser for diabetic retinopathy"  . FRACTURE SURGERY    . OPEN REDUCTION INTERNAL FIXATION (ORIF) TIBIA/FIBULA FRACTURE Right 10/30/2016  . ORIF ANKLE FRACTURE Left 2015  . ORIF TIBIA FRACTURE Right 10/30/2016   Procedure: OPEN REDUCTION INTERNAL FIXATION (ORIF) TIBIA FIBULA FRACTURE;  Surgeon: Altamese Georgetown, MD;  Location: [facility];  Service: Orthopedics;  Laterality: Right;  . RETINAL LASER PROCEDURE Bilateral    "for diabetic retinopathy"    There were no vitals filed  for this visit.  Subjective Assessment - 10/30/19 0852    Subjective  No falls. Still with occasional pain in right knee, now popping in standing when he bends the knee in a certain way/direction. Has not called his MD as yet about the knee.    Patient is accompained by:  Family member   mom in car   Pertinent History  multiple falls, vitamin D insufficiency, Type 1 IDDM, diabetic retinopathy, hypothyroidism, HTN, hyperlipidemia, R tibia fracture, L ankle fracture with ORIF    Diagnostic tests  No new lesions seen on MRI    Patient Stated Goals  To improve walking    Currently in Pain?  No/denies    Pain Score  0-No pain         [facility] PT Assessment - 10/30/19 0855      Standardized Balance Assessment   Standardized Balance Assessment  10 meter walk test    10 Meter Walk  104.46 sec's with RW (pt needing to stop several times)= 0.31 ft/sec with RW      Berg Balance Test   Sit to Stand  Able to stand  independently using hands   unable to w/o UEs after several attempts   Standing Unsupported  Able to stand safely 2 minutes    Sitting with Back Unsupported but Feet Supported on Floor or Stool  Able to sit safely and securely 2 minutes    Stand to Sit  Controls descent by using hands    Transfers  Able to transfer safely, definite need of hands    Standing Unsupported with Eyes Closed  Able to stand 10 seconds with supervision    Standing Unsupported with Feet Together  Able to place feet together independently but unable to hold for 30 seconds   post balance loss when knees locked out   From Standing, Reach Forward with Outstretched Arm  Can reach confidently >25 cm (10")    From Standing Position, Pick up Object from Floor  Able to pick up shoe safely and easily    From Standing Position, Turn to Look Behind Over each Shoulder  Looks behind one side only/other side shows less weight shift   left>right side   Turn 360 Degrees  Needs assistance while turning    Standing Unsupported,  Alternately Place Feet on Step/Stool  Able to complete >2 steps/needs minimal assist    Standing Unsupported, One Foot in Front  Able to plae foot ahead of the other independently and hold 30 seconds    Standing on One Leg  Tries to lift leg/unable to hold 3 seconds but remains standing independently    Total Score  38    Berg comment:  38/56= significant risk for falls            [facility] Adult PT Treatment/Exercise - 10/30/19 0855      Transfers   Transfers  Sit to Stand;Stand to Sit    Sit to Stand  5: Supervision;With upper extremity assist;From bed;From chair/3-in-1    Stand to Sit  5: Supervision;With upper extremity assist;To bed;To chair/3-in-1      Ambulation/Gait   Ambulation/Gait  Yes    Ambulation/Gait Assistance  5: Supervision;4: Min guard    Ambulation/Gait Assistance Details  around gym with session. cues for decreased UE weight bearing, soft knees and slow advancement with swing phase to decrease extensor tone kicking in.     Assistive device  Rolling walker    Gait Pattern  Step-to pattern;Decreased step length - right;Decreased step length - left;Decreased stance time - right;Decreased stance time - left;Decreased stride length;Decreased hip/knee flexion - right;Decreased hip/knee flexion - left;Decreased dorsiflexion - right;Right genu recurvatum;Left genu recurvatum;Lateral trunk lean to left;Poor foot clearance - right    Ambulation Surface  Level;Indoor      Neuro Re-ed    Neuro Re-ed Details   for strengthening/muscle re-ed: tall kneeling on red mat next to mat table with green band resistance at hip pulling in lateral direction (performed both sides) - alternating UE raises, bil UE raises for 5-6 reps each, head movements left<>right, up<>down for ~8 reps each. rest breaks taken as needed due to fatigue with UE touch to mat as neded for balance.            PT Short Term Goals - 10/30/19 0854      PT SHORT TERM GOAL #1   Title  Patient will demonstrate  independence with updated HEP  (Update STG after 8/0/03 - 90 day cert)    Baseline  09/04/15: met with current progam which pt reports is stil challenging    Status  Achieved    Target Date  11/03/19      PT SHORT TERM GOAL #2   Title  Pt will improve gait velocity with RW to >/= 1.4 ft/sec    Baseline  10/30/19: 104.46= 0.31 ft/sec  with RW    Time  --    Period  --    Status  Not Met    Target Date  11/03/19      PT SHORT TERM GOAL #3   Title  Pt will improve BERG to >/= 44/56    Baseline  10/30/19: 38/56 scored today, decrease by 2 points from last assessment    Time  --    Period  --    Status  Not Met    Target Date  11/03/19      PT SHORT TERM GOAL #4   Title  Pt will ambulate x 230' over flat, indoor surfaces with cane with min A and intermittent standing rest breaks.    Time  4    Period  Weeks    Status  On-going      PT SHORT TERM GOAL #5   Title  Pt will negotate 8 stairs with two rails step alternating sequence with supervision    Time  4    Period  Weeks    Status  On-going    Target Date  11/03/19        PT Long Term Goals - 10/04/19 1811      PT LONG TERM GOAL #1   Title  Pt will demonstrate independence with land HEP    Time  12    Period  Weeks    Status  Revised    Target Date  01/02/20      PT LONG TERM GOAL #2   Title  Pt will improve gait velocity with cane to >/= 0.8 ft/sec    Baseline  Not assessed with cane    Time  12    Period  Weeks    Status  Revised    Target Date  01/02/20      PT LONG TERM GOAL #3   Title  Pt will improve BERG to >/= 46/56 to decrease falls risk    Time  12    Period  Weeks    Status  Revised    Target Date  01/02/20      PT LONG TERM GOAL #4   Title  Pt will ambulate x 200' outside over pavement and grass with supervision and cane to improve safety ambulating to wood working shop at home    Time  12    Period  Weeks    Status  Revised    Target Date  01/02/20      PT LONG TERM GOAL #5   Title  Pt will  negotiate 8 stairs with one rail and cane MOD I for safe home entry/exit with cane    Time  12    Period  Weeks    Status  Revised    Target Date  01/02/20            Plan - 10/30/19 0854    Clinical Impression Statement  Today's skilled session began to address progress toward STGs with pt decreasing his Berg Balance Test score by 2 points (from 40/56 to 38/56 scored today). Pt also had a decrease in his 10 meter gait speed with RW. Of note he had to stop several times during testing of 10 meter due to bil LE tone/LE's "locking out". Remainder of session continued to address core/hip strengthening with no issues reported or noted.    Comorbidities  multiple falls, vitamin D insufficiency, Type 1 IDDM, diabetic retinopathy, hypothyroidism, HTN, hyperlipidemia, R tibia fracture,  L ankle fracture with ORIF    Rehab Potential  Good    PT Frequency  2x / week    PT Duration  12 weeks    PT Treatment/Interventions  ADLs/Self Care Home Management;Aquatic Therapy;Electrical Stimulation;DME Instruction;Gait training;Stair training;Functional mobility training;Therapeutic activities;Therapeutic exercise;Balance training;Neuromuscular re-education;Patient/family education;Orthotic Fit/Training;Passive range of motion;Energy conservation    PT Next Visit Plan  check remaining STGs due 11/03/19    PT Home Exercise Plan  Revised HEP on 3/18    Consulted and Agree with Plan of Care  Patient       Patient will benefit from skilled therapeutic intervention in order to improve the following deficits and impairments:  Abnormal gait, Decreased balance, Decreased endurance, Decreased coordination, Decreased range of motion, Decreased strength, Difficulty walking, Impaired tone, Postural dysfunction, Decreased activity tolerance, Decreased mobility  Visit Diagnosis: Other abnormalities of gait and mobility  Muscle weakness (generalized)  Unsteadiness on feet  Difficulty in walking, not elsewhere  classified  Foot drop, right     Problem List Patient Active Problem List   Diagnosis Date Noted  . Gait disturbance 08/19/2019  . Acquired diplegia ([place]) 08/19/2019  . Vitamin D insufficiency 11/29/2016  . Hypogonadism in male 11/01/2016  . 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 Hypothyroidism   . Hypertension   . Hyperlipidemia   . Closed fracture of right fibula and tibia 10/29/2016  . Closed tibia fracture 10/29/2016  . Closed fracture of right tibial plafond with fibula involvement 10/29/2016  . Abnormal liver function tests 03/06/2013  . RASH AND OTHER NONSPECIFIC SKIN ERUPTION 10/07/2009  . SHOULDER PAIN, LEFT 12/27/2008  . Type I (juvenile type) diabetes mellitus without mention of complication, uncontrolled 12/03/2007  . HYPOTHYROIDISM 11/12/2007  . HYPERCHOLESTEROLEMIA 11/12/2007  . MULTIPLE SCLEROSIS 08/29/2007  . PROLIFERATIVE DIABETIC RETINOPATHY 08/29/2007  . HYPERTENSION 12/25/2006    Willow Ora, PTA, [facility] [address], [place], [place] [address] [phone] 10/30/19, 3:52 PM   Name: Johnathan Ross MRN: [id number] Date of Birth: 06-Dec-1970

## 2019-10-31 ENCOUNTER — Other Ambulatory Visit: Payer: Self-pay | Admitting: Endocrinology

## 2019-11-04 ENCOUNTER — Encounter: Payer: Self-pay | Admitting: Physical Therapy

## 2019-11-04 ENCOUNTER — Other Ambulatory Visit: Payer: Self-pay

## 2019-11-04 ENCOUNTER — Ambulatory Visit: Payer: Medicare Other | Admitting: Physical Therapy

## 2019-11-04 DIAGNOSIS — R262 Difficulty in walking, not elsewhere classified: Secondary | ICD-10-CM | POA: Diagnosis not present

## 2019-11-04 DIAGNOSIS — M6281 Muscle weakness (generalized): Secondary | ICD-10-CM | POA: Diagnosis not present

## 2019-11-04 DIAGNOSIS — R2689 Other abnormalities of gait and mobility: Secondary | ICD-10-CM

## 2019-11-04 DIAGNOSIS — R296 Repeated falls: Secondary | ICD-10-CM

## 2019-11-04 DIAGNOSIS — R2681 Unsteadiness on feet: Secondary | ICD-10-CM | POA: Diagnosis not present

## 2019-11-04 DIAGNOSIS — M21371 Foot drop, right foot: Secondary | ICD-10-CM

## 2019-11-04 NOTE — Therapy (Signed)
[facility] [address] [place], [place], [address] Phone: [phone]   Fax:  [phone]  Physical Therapy Treatment  Patient Details  Name: Johnathan Ross MRN: [id number] Date of Birth: 08/21/1970 Referring Provider (PT): Carylon Perches, MD   Encounter Date: 11/04/2019   PT End of Session - 11/04/19 2143    Visit Number 61    Number of Visits 35    Date for PT Re-Evaluation 01/02/20    Authorization Type Medicare Part A and B; 10th visit PN    Progress Note Due on Visit 55    PT Start Time 0850    PT Stop Time 0934    PT Time Calculation (min) 44 min    Activity Tolerance Patient tolerated treatment well;Patient limited by pain    Behavior During Therapy [facility] for tasks assessed/performed           Past Medical History:  Diagnosis Date  . Hyperlipidemia   . Hypertension   . Hypogonadism in male 11/01/2016  . Hypothyroidism   . Multiple sclerosis ([place])   . Proliferative diabetic retinopathy(362.02)   . Type 1 diabetes mellitus ([place]) dx'd 1994  . Vitamin D insufficiency 11/29/2016    Past Surgical History:  Procedure Laterality Date  . EYE SURGERY Bilateral    "laser for diabetic retinopathy"  . FRACTURE SURGERY    . OPEN REDUCTION INTERNAL FIXATION (ORIF) TIBIA/FIBULA FRACTURE Right 10/30/2016  . ORIF ANKLE FRACTURE Left 2015  . ORIF TIBIA FRACTURE Right 10/30/2016   Procedure: OPEN REDUCTION INTERNAL FIXATION (ORIF) TIBIA FIBULA FRACTURE;  Surgeon: Altamese [place], MD;  Location: [facility];  Service: Orthopedics;  Laterality: Right;  . RETINAL LASER PROCEDURE Bilateral    "for diabetic retinopathy"    There were no vitals filed for this visit.   Subjective Assessment - 11/04/19 0854    Subjective Pt goes to see PCP next month, still having some knee pain in certain positions.  When walking if the knee snaps back he feels the pain and tone kicks in.    Patient is accompained by: Family member   mom in  car   Pertinent History multiple falls, vitamin D insufficiency, Type 1 IDDM, diabetic retinopathy, hypothyroidism, HTN, hyperlipidemia, R tibia fracture, L ankle fracture with ORIF    Diagnostic tests No new lesions seen on MRI    Patient Stated Goals To improve walking    Currently in Pain? Yes              [facility] PT Assessment - 11/04/19 7902      Special Tests    Special Tests Meniscus Tests;Knee Special Tests    Other special tests Anterior and Posterior Drawer Tests - negative on R knee.  Varus and Valgus Stress tests - no gaping, pain with palpation of lateral joint line    Meniscus Tests McMurray Test;Apley's Test   + on lateral R knee, popping, snapping, pain at jt line     McMurray Test   Findings Positive    Side Right    Comments + for pain, snapping/popping as knee moves into extension and pain with excessive knee flexion      Apley's Test   Findings Negative    Side Right                         [facility] Adult PT Treatment/Exercise - 11/04/19 2136      Self-Care   Self-Care RICE  RICE discussed use of rest, ice, compression and elevation of R knee to address inflammation, edema and pain.  Also educated pt on holding on quadruped exercises to due to pain/pinching in R lateral knee and ongoing irritation of injured area.        Neuro Re-ed    Neuro Re-ed Details  NMR to facilitate more upright posture, core activation and UE extension without over dependence on trunk flexion and WB through UE on RW.  Use of trekking poles for UE support - cues for UE extension pushing into poles and more upright posture while performing 10 reps each foot taps to 2" step and then stepping forwards and backwards in place advancing contralateral UE x 10 reps each side with min A for balance and lateral weight shifting.  Pt demonstrated improved foot clearance with taps to 2" step and step length bilaterally.                  PT Education - 11/04/19 2143     Education Details see self care    Person(s) Educated Patient    Methods Explanation    Comprehension Verbalized understanding            PT Short Term Goals - 10/30/19 0854      PT SHORT TERM GOAL #1   Title Patient will demonstrate independence with updated HEP  (Update STG after 01/30/61 - 90 day cert)    Baseline 05/30/06: met with current progam which pt reports is stil challenging    Status Achieved    Target Date 11/03/19      PT SHORT TERM GOAL #2   Title Pt will improve gait velocity with RW to >/= 1.4 ft/sec    Baseline 10/30/19: 104.46= 0.31 ft/sec with RW    Time --    Period --    Status Not Met    Target Date 11/03/19      PT SHORT TERM GOAL #3   Title Pt will improve BERG to >/= 44/56    Baseline 10/30/19: 38/56 scored today, decrease by 2 points from last assessment    Time --    Period --    Status Not Met    Target Date 11/03/19      PT SHORT TERM GOAL #4   Title Pt will ambulate x 230' over flat, indoor surfaces with cane with min A and intermittent standing rest breaks.    Time 4    Period Weeks    Status On-going      PT SHORT TERM GOAL #5   Title Pt will negotate 8 stairs with two rails step alternating sequence with supervision    Time 4    Period Weeks    Status On-going    Target Date 11/03/19             PT Long Term Goals - 10/04/19 1811      PT LONG TERM GOAL #1   Title Pt will demonstrate independence with land HEP    Time 12    Period Weeks    Status Revised    Target Date 01/02/20      PT LONG TERM GOAL #2   Title Pt will improve gait velocity with cane to >/= 0.8 ft/sec    Baseline Not assessed with cane    Time 12    Period Weeks    Status Revised    Target Date 01/02/20      PT LONG TERM GOAL #  3   Title Pt will improve BERG to >/= 46/56 to decrease falls risk    Time 12    Period Weeks    Status Revised    Target Date 01/02/20      PT LONG TERM GOAL #4   Title Pt will ambulate x 200' outside over pavement and grass  with supervision and cane to improve safety ambulating to wood working shop at home    Time 12    Period Weeks    Status Revised    Target Date 01/02/20      PT LONG TERM GOAL #5   Title Pt will negotiate 8 stairs with one rail and cane MOD I for safe home entry/exit with cane    Time 12    Period Weeks    Status Revised    Target Date 01/02/20                 Plan - 11/04/19 2143    Clinical Impression Statement Due to ongoing pain in R knee during gait PT performed assessment of R knee ligament stability and meniscus tests.  Tests may indicate anterior meniscus tear in R knee but advised pt to have knee further assessed by PCP.  Pt advised on how to rest R knee and use of R.I.C.E.  Incorporated use of trekking poles for [employer] - will incorporate into gait training at next session.  Pt did not report any increase in knee pain when standing with trekking poles.  Will continue to progress towards LTG as pt is able to tolerate.  Pt advised to discuss knee pain with PCP at next visit in one month.    Comorbidities multiple falls, vitamin D insufficiency, Type 1 IDDM, diabetic retinopathy, hypothyroidism, HTN, hyperlipidemia, R tibia fracture, L ankle fracture with ORIF    Rehab Potential Good    PT Frequency 2x / week    PT Duration 12 weeks    PT Treatment/Interventions ADLs/Self Care Home Management;Aquatic Therapy;Electrical Stimulation;DME Instruction;Gait training;Stair training;Functional mobility training;Therapeutic activities;Therapeutic exercise;Balance training;Neuromuscular re-education;Patient/family education;Orthotic Fit/Training;Passive range of motion;Energy conservation    PT Next Visit Plan How is knee?  check remaining STG - gait with cane/trekking poles, stairs alternating sequence    Consulted and Agree with Plan of Care Patient           Patient will benefit from skilled therapeutic intervention in order to improve the following deficits and impairments:  Abnormal  gait, Decreased balance, Decreased endurance, Decreased coordination, Decreased range of motion, Decreased strength, Difficulty walking, Impaired tone, Postural dysfunction, Decreased activity tolerance, Decreased mobility  Visit Diagnosis: Other abnormalities of gait and mobility  Muscle weakness (generalized)  Unsteadiness on feet  Difficulty in walking, not elsewhere classified  Foot drop, right  Repeated falls     Problem List Patient Active Problem List   Diagnosis Date Noted  . Gait disturbance 08/19/2019  . Acquired diplegia ([place]) 08/19/2019  . Vitamin D insufficiency 11/29/2016  . Hypogonadism in male 11/01/2016  . 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 Hypothyroidism   . Hypertension   . Hyperlipidemia   . Closed fracture of right fibula and tibia 10/29/2016  . Closed tibia fracture 10/29/2016  . Closed fracture of right tibial plafond with fibula involvement 10/29/2016  . Abnormal liver function tests 03/06/2013  . RASH AND OTHER NONSPECIFIC SKIN ERUPTION 10/07/2009  . SHOULDER PAIN, LEFT 12/27/2008  . Type I (juvenile type) diabetes mellitus without mention of complication, uncontrolled 12/03/2007  .  HYPOTHYROIDISM 11/12/2007  . HYPERCHOLESTEROLEMIA 11/12/2007  . MULTIPLE SCLEROSIS 08/29/2007  . PROLIFERATIVE DIABETIC RETINOPATHY 08/29/2007  . HYPERTENSION 12/25/2006   [name], PT, DPT 11/05/19    3:14 PM    [facility] [address] [place], [place], [address] Phone: [phone]   Fax:  [phone]  Name: Johnathan Ross MRN: [id number] Date of Birth: 02/20/1971

## 2019-11-06 ENCOUNTER — Other Ambulatory Visit: Payer: Self-pay

## 2019-11-06 ENCOUNTER — Ambulatory Visit: Payer: Medicare Other | Admitting: Family

## 2019-11-06 ENCOUNTER — Telehealth: Payer: Self-pay | Admitting: Family

## 2019-11-06 ENCOUNTER — Ambulatory Visit: Payer: Medicare Other

## 2019-11-06 DIAGNOSIS — R2681 Unsteadiness on feet: Secondary | ICD-10-CM | POA: Diagnosis not present

## 2019-11-06 DIAGNOSIS — M21371 Foot drop, right foot: Secondary | ICD-10-CM | POA: Diagnosis not present

## 2019-11-06 DIAGNOSIS — R262 Difficulty in walking, not elsewhere classified: Secondary | ICD-10-CM | POA: Diagnosis not present

## 2019-11-06 DIAGNOSIS — R2689 Other abnormalities of gait and mobility: Secondary | ICD-10-CM

## 2019-11-06 DIAGNOSIS — R296 Repeated falls: Secondary | ICD-10-CM | POA: Diagnosis not present

## 2019-11-06 DIAGNOSIS — M6281 Muscle weakness (generalized): Secondary | ICD-10-CM | POA: Diagnosis not present

## 2019-11-06 NOTE — Telephone Encounter (Signed)
-----   Message from [name], PT sent at 11/05/2019  3:19 PM EDT ----- Juluis Rainier for when you see Mr. Freel in a few weeks.  Pt reported to therapist that he was getting up off the floor when he heard a pop in his R knee and has been experiencing increased pain.  PT performed a few knee special tests; pt had a positive McMurray's Test to assess meniscus.  He was positive for pain, snapping and popping on R lateral knee; he is also tender to palpation at the lateral joint line with some edema.   We are being more conservative with his exercises right now and we have recommended ice and compression intermittently during the day.  Please advise if you have any other recommendations.  Thank you, [name], PT, DPT 11/05/19    3:19 PM

## 2019-11-06 NOTE — Telephone Encounter (Signed)
Called and left messages on both numbers listed in patient's chart.

## 2019-11-06 NOTE — Telephone Encounter (Signed)
Please let him know that he should either see sports medicine or orthopedics about these issues. I am not equipped to manage these for him. We will need to cancel for Monday. Does he need a referral?

## 2019-11-06 NOTE — Therapy (Signed)
[facility] [address] [place], [place], [address] Phone: [phone]   Fax:  [phone]  Physical Therapy Treatment  Patient Details  Name: Johnathan Ross MRN: [id number] Date of Birth: [birth year] Referring Provider (PT): Carylon Perches, MD   Encounter Date: 11/06/2019   PT End of Session - 11/06/19 0848    Visit Number 15    Number of Visits 36    Date for PT Re-Evaluation 01/02/20    Authorization Type Medicare Part A and B; 10th visit PN    Progress Note Due on Visit 84    PT Start Time 0845    PT Stop Time 0930    PT Time Calculation (min) 45 min    Equipment Utilized During Treatment Gait belt    Activity Tolerance Patient tolerated treatment well;Patient limited by pain    Behavior During Therapy WFL for tasks assessed/performed           Past Medical History:  Diagnosis Date  . Hyperlipidemia   . Hypertension   . Hypogonadism in male 11/01/2016  . Hypothyroidism   . Multiple sclerosis ([place])   . Proliferative diabetic retinopathy(362.02)   . Type 1 diabetes mellitus ([place]) dx'd 1994  . Vitamin D insufficiency 11/29/2016    Past Surgical History:  Procedure Laterality Date  . EYE SURGERY Bilateral    "laser for diabetic retinopathy"  . FRACTURE SURGERY    . OPEN REDUCTION INTERNAL FIXATION (ORIF) TIBIA/FIBULA FRACTURE Right 10/30/2016  . ORIF ANKLE FRACTURE Left 2015  . ORIF TIBIA FRACTURE Right 10/30/2016   Procedure: OPEN REDUCTION INTERNAL FIXATION (ORIF) TIBIA FIBULA FRACTURE;  Surgeon: Altamese [place], MD;  Location: [facility];  Service: Orthopedics;  Laterality: Right;  . RETINAL LASER PROCEDURE Bilateral    "for diabetic retinopathy"    There were no vitals filed for this visit.   Subjective Assessment - 11/06/19 0848    Subjective Pt reports that knee is feeling some better as not as painful. He still feels it in certain positions. Reports that laying on it with the pressure bothers it  some.    Patient is accompained by: Family member   mom in car   Pertinent History multiple falls, vitamin D insufficiency, Type 1 IDDM, diabetic retinopathy, hypothyroidism, HTN, hyperlipidemia, R tibia fracture, L ankle fracture with ORIF    Diagnostic tests No new lesions seen on MRI    Patient Stated Goals To improve walking    Currently in Pain? Yes    Pain Score 0-No pain    Pain Location Knee    Pain Orientation Right    Pain Descriptors / Indicators Nagging;Tender    Pain Type Acute pain                             [facility] Adult PT Treatment/Exercise - 11/06/19 0850      Transfers   Transfers Sit to Stand;Stand to Sit    Sit to Stand 5: Supervision    Stand to Sit 5: Supervision      Ambulation/Gait   Ambulation/Gait Yes    Ambulation/Gait Assistance 4: Min assist   +2 helpers for safety   Ambulation/Gait Assistance Details Pt was given verbal cues for sequencing with trekking poles. Able to demonstrate 4 point gait pattern. Limited left foot clearance especially when tone kicks on and left knee locks out with taking larger right step. Needs to "soften" knees before he can clear  left foot again. +2 assist for safety as first time trying but mostly for balance. Trialed toe cap on left foot the second bout but no real change in clearance when tone kicks in. PT provided tactile cues at left posterior knee during stance to try to faciliate more knee flexion which did help decrease recurvatum some.    Ambulation Distance (Feet) 20 Feet   20' x 1   Assistive device --   bilateral trekking poles, right AFO   Gait Pattern Step-to pattern;Step-through pattern;Decreased hip/knee flexion - left;Poor foot clearance - left;Left genu recurvatum    Ambulation Surface Level;Indoor    Stairs Yes    Stairs Assistance 5: Supervision    Stairs Assistance Details (indicate cue type and reason) Pt had to pause to get slight bend in knees prior to stepping down.    Stair Management  Technique Alternating pattern;Two rails    Number of Stairs 4      Neuro Re-ed    Neuro Re-ed Details  Standing at walker tapping 4" step x 5 each side with more difficulty raising left leg. Tapping 2" step with RLE holding to trekking poles min assist at pelvis to weight shift left and tactile cues at left knee to maintain neutral position. Stepping forward and back with LLE holding trekking poles with tactile cues at pelvis to weight shift right.                    PT Short Term Goals - 11/06/19 1128      PT SHORT TERM GOAL #1   Title Patient will demonstrate independence with updated HEP  (Update STG after 0/1/74 - 90 day cert)    Baseline 01/30/48: met with current progam which pt reports is stil challenging    Status Achieved    Target Date 11/03/19      PT SHORT TERM GOAL #2   Title Pt will improve gait velocity with RW to >/= 1.4 ft/sec    Baseline 10/30/19: 104.46= 0.31 ft/sec with RW    Status Not Met    Target Date 11/03/19      PT SHORT TERM GOAL #3   Title Pt will improve BERG to >/= 44/56    Baseline 10/30/19: 38/56 scored today, decrease by 2 points from last assessment    Status Not Met    Target Date 11/03/19      PT SHORT TERM GOAL #4   Title Pt will ambulate x 230' over flat, indoor surfaces with cane with min A and intermittent standing rest breaks.    Baseline Pt currently ambulating with RW, trialed bilateral trekking poles today and min assist +2 for 20'    Time 4    Period Weeks    Status Not Met      PT SHORT TERM GOAL #5   Title Pt will negotate 8 stairs with two rails step alternating sequence with supervision    Baseline 11/06/19 pt performed reciprocal pattern negotiating 4 steps supervisoin with bilateral rails    Time 4    Period Weeks    Status Partially Met    Target Date 11/03/19             PT Long Term Goals - 10/04/19 1811      PT LONG TERM GOAL #1   Title Pt will demonstrate independence with land HEP    Time 12    Period  Weeks    Status Revised    Target Date  01/02/20      PT LONG TERM GOAL #2   Title Pt will improve gait velocity with cane to >/= 0.8 ft/sec    Baseline Not assessed with cane    Time 12    Period Weeks    Status Revised    Target Date 01/02/20      PT LONG TERM GOAL #3   Title Pt will improve BERG to >/= 46/56 to decrease falls risk    Time 12    Period Weeks    Status Revised    Target Date 01/02/20      PT LONG TERM GOAL #4   Title Pt will ambulate x 200' outside over pavement and grass with supervision and cane to improve safety ambulating to wood working shop at home    Time 12    Period Weeks    Status Revised    Target Date 01/02/20      PT LONG TERM GOAL #5   Title Pt will negotiate 8 stairs with one rail and cane MOD I for safe home entry/exit with cane    Time 12    Period Weeks    Status Revised    Target Date 01/02/20                 Plan - 11/06/19 1131    Clinical Impression Statement Pt was not limited by knee pain today reporting that it just hurts some in certain positions. PT assessed stair goal and patient partially met goal with reciprocal pattern on 4 steps supervision. PT continued to work more with trekking poles to decrease UE assistance with activities. Most challenged with advancing LLE when knee locks out and tone kicks in. Pt with good awareness of this and takes his time to try to "soften" knee before stepping again. Was able to ambulate short distance with bilateral poles min assist +2 for safety mostly for balance. Pt had not been resting knee as much as advised stating he stays busy and only iced once.    Comorbidities multiple falls, vitamin D insufficiency, Type 1 IDDM, diabetic retinopathy, hypothyroidism, HTN, hyperlipidemia, R tibia fracture, L ankle fracture with ORIF    Rehab Potential Good    PT Frequency 2x / week    PT Duration 12 weeks    PT Treatment/Interventions ADLs/Self Care Home Management;Aquatic Therapy;Electrical  Stimulation;DME Instruction;Gait training;Stair training;Functional mobility training;Therapeutic activities;Therapeutic exercise;Balance training;Neuromuscular re-education;Patient/family education;Orthotic Fit/Training;Passive range of motion;Energy conservation    PT Next Visit Plan How is knee?  Continues standing activities with use of trekking poles to decrease UE support as well as gait. May want 2nd person for safety. Continue activities to promote more left hip/knee flexion.    Consulted and Agree with Plan of Care Patient           Patient will benefit from skilled therapeutic intervention in order to improve the following deficits and impairments:  Abnormal gait, Decreased balance, Decreased endurance, Decreased coordination, Decreased range of motion, Decreased strength, Difficulty walking, Impaired tone, Postural dysfunction, Decreased activity tolerance, Decreased mobility  Visit Diagnosis: Other abnormalities of gait and mobility  Muscle weakness (generalized)     Problem List Patient Active Problem List   Diagnosis Date Noted  . Gait disturbance 08/19/2019  . Acquired diplegia ([place]) 08/19/2019  . Vitamin D insufficiency 11/29/2016  . Hypogonadism in male 11/01/2016  . 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[place])   . Hypothyroidism   . Hypertension   . Hyperlipidemia   .  Closed fracture of right fibula and tibia 10/29/2016  . Closed tibia fracture 10/29/2016  . Closed fracture of right tibial plafond with fibula involvement 10/29/2016  . Abnormal liver function tests 03/06/2013  . RASH AND OTHER NONSPECIFIC SKIN ERUPTION 10/07/2009  . SHOULDER PAIN, LEFT 12/27/2008  . Type I (juvenile type) diabetes mellitus without mention of complication, uncontrolled 12/03/2007  . HYPOTHYROIDISM 11/12/2007  . HYPERCHOLESTEROLEMIA 11/12/2007  . MULTIPLE SCLEROSIS 08/29/2007  . PROLIFERATIVE DIABETIC RETINOPATHY 08/29/2007  . HYPERTENSION 12/25/2006    Electa Sniff, PT, DPT, NCS 11/06/2019, 11:36 AM  [facility] [address] [place], [place], [address] Phone: [phone]   Fax:  [phone]  Name: Johnathan Ross MRN: [id number] Date of Birth: [birth year]

## 2019-11-09 ENCOUNTER — Ambulatory Visit: Payer: Medicare Other | Admitting: Family

## 2019-11-09 ENCOUNTER — Other Ambulatory Visit: Payer: Self-pay | Admitting: Family

## 2019-11-09 DIAGNOSIS — M25569 Pain in unspecified knee: Secondary | ICD-10-CM

## 2019-11-10 ENCOUNTER — Other Ambulatory Visit: Payer: Self-pay

## 2019-11-10 ENCOUNTER — Ambulatory Visit: Payer: Medicare Other | Admitting: Physical Therapy

## 2019-11-10 ENCOUNTER — Encounter: Payer: Self-pay | Admitting: Physical Therapy

## 2019-11-10 DIAGNOSIS — R2681 Unsteadiness on feet: Secondary | ICD-10-CM

## 2019-11-10 DIAGNOSIS — R296 Repeated falls: Secondary | ICD-10-CM | POA: Diagnosis not present

## 2019-11-10 DIAGNOSIS — R2689 Other abnormalities of gait and mobility: Secondary | ICD-10-CM | POA: Diagnosis not present

## 2019-11-10 DIAGNOSIS — M6281 Muscle weakness (generalized): Secondary | ICD-10-CM

## 2019-11-10 DIAGNOSIS — M21371 Foot drop, right foot: Secondary | ICD-10-CM

## 2019-11-10 DIAGNOSIS — R262 Difficulty in walking, not elsewhere classified: Secondary | ICD-10-CM

## 2019-11-10 NOTE — Therapy (Signed)
[facility] [address] [place], [place], [address] Phone: [phone]   Fax:  [phone]  Physical Therapy Treatment  Patient Details  Name: Johnathan Ross MRN: [id number] Date of Birth: 07/10/1970 Referring Provider (PT): Carylon Perches, MD   Encounter Date: 11/10/2019   PT End of Session - 11/10/19 0852    Visit Number 53    Number of Visits 31    Date for PT Re-Evaluation 01/02/20    Authorization Type Medicare Part A and B; 10th visit PN    Progress Note Due on Visit 34    PT Start Time 0847    PT Stop Time 0926    PT Time Calculation (min) 39 min    Equipment Utilized During Treatment Gait belt    Activity Tolerance Patient tolerated treatment well;Patient limited by pain    Behavior During Therapy [facility] for tasks assessed/performed           Past Medical History:  Diagnosis Date   Hyperlipidemia    Hypertension    Hypogonadism in male 11/01/2016   Hypothyroidism    Multiple sclerosis ([place])    Proliferative diabetic retinopathy(362.02)    Type 1 diabetes mellitus ([place]) dx'd 1994   Vitamin D insufficiency 11/29/2016    Past Surgical History:  Procedure Laterality Date   EYE SURGERY Bilateral    "laser for diabetic retinopathy"   FRACTURE SURGERY     OPEN REDUCTION INTERNAL FIXATION (ORIF) TIBIA/FIBULA FRACTURE Right 10/30/2016   ORIF ANKLE FRACTURE Left 2015   ORIF TIBIA FRACTURE Right 10/30/2016   Procedure: OPEN REDUCTION INTERNAL FIXATION (ORIF) TIBIA FIBULA FRACTURE;  Surgeon: Altamese [place], MD;  Location: [facility];  Service: Orthopedics;  Laterality: Right;   RETINAL LASER PROCEDURE Bilateral    "for diabetic retinopathy"    There were no vitals filed for this visit.   Subjective Assessment - 11/10/19 0851    Subjective No new complaints. Still having some popping, pain is getting better. No falls.    Patient is accompained by: Family member   mom in car   Pertinent History  multiple falls, vitamin D insufficiency, Type 1 IDDM, diabetic retinopathy, hypothyroidism, HTN, hyperlipidemia, R tibia fracture, L ankle fracture with ORIF    Limitations Walking    Diagnostic tests No new lesions seen on MRI    Patient Stated Goals To improve walking    Currently in Pain? No/denies    Pain Score 0-No pain                 [facility] Adult PT Treatment/Exercise - 11/10/19 0855      Transfers   Transfers Sit to Stand;Stand to Sit    Sit to Stand 5: Supervision;With upper extremity assist;From bed;From chair/3-in-1    Stand to Sit 5: Supervision;With upper extremity assist;To bed;To chair/3-in-1      Ambulation/Gait   Ambulation/Gait Yes    Ambulation/Gait Assistance 5: Supervision    Ambulation/Gait Assistance Details around gym with session with emphasis on soft knees. pt able to ambulate from mat to parallel bars without stopping due tone, and at end of session from parallel bars toward lobby with only one episode of left LE tone which he was able to break without stopping. cues for relaxed shoulders/UE's as well.     Assistive device Rolling walker    Gait Pattern Step-to pattern;Step-through pattern;Decreased hip/knee flexion - left;Poor foot clearance - left;Left genu recurvatum    Ambulation Surface Level;Indoor  Neuro Re-ed    Neuro Re-ed Details  in parallel bars with single UE support: with 4 inch box fwd foot taps to box/back to floor, alternating LE's. emphasis placed on soft knees, fwd/bwd weight shifting vs lateral weight shifing.                Balance Exercises - 11/10/19 0001      Balance Exercises: Standing   Rockerboard Anterior/posterior;Lateral;Head turns;EO;10 reps;Limitations;Intermittent UE support    Rockerboard Limitations in ant/post direction on board: alternating fwd stepping to floor/back onto board with single UE support, emphasis on soft knees, pushing off with return to board and correct weight shifting (not to lean too  laterally) for 2 sets of 5 reps each side; performed both ways on board to work on hip/core strengthening- alternating UE raises, then bil UE raises for 10 reps each, then with arms at sides had pt perform head movements left<>right, then up<>down for ~10 reps each. cues on posture (shoulders over hips), weight shifting and soft knees to assist with balance; intermittent touch to bars needed. min guard to min assist for balance.                PT Short Term Goals - 11/06/19 1128      PT SHORT TERM GOAL #1   Title Patient will demonstrate independence with updated HEP  (Update STG after 01/30/61 - 90 day cert)    Baseline 05/30/06: met with current progam which pt reports is stil challenging    Status Achieved    Target Date 11/03/19      PT SHORT TERM GOAL #2   Title Pt will improve gait velocity with RW to >/= 1.4 ft/sec    Baseline 10/30/19: 104.46= 0.31 ft/sec with RW    Status Not Met    Target Date 11/03/19      PT SHORT TERM GOAL #3   Title Pt will improve BERG to >/= 44/56    Baseline 10/30/19: 38/56 scored today, decrease by 2 points from last assessment    Status Not Met    Target Date 11/03/19      PT SHORT TERM GOAL #4   Title Pt will ambulate x 230' over flat, indoor surfaces with cane with min A and intermittent standing rest breaks.    Baseline Pt currently ambulating with RW, trialed bilateral trekking poles today and min assist +2 for 20'    Time 4    Period Weeks    Status Not Met      PT SHORT TERM GOAL #5   Title Pt will negotate 8 stairs with two rails step alternating sequence with supervision    Baseline 11/06/19 pt performed reciprocal pattern negotiating 4 steps supervisoin with bilateral rails    Time 4    Period Weeks    Status Partially Met    Target Date 11/03/19             PT Long Term Goals - 10/04/19 1811      PT LONG TERM GOAL #1   Title Pt will demonstrate independence with land HEP    Time 12    Period Weeks    Status Revised    Target  Date 01/02/20      PT LONG TERM GOAL #2   Title Pt will improve gait velocity with cane to >/= 0.8 ft/sec    Baseline Not assessed with cane    Time 12    Period Weeks    Status  Revised    Target Date 01/02/20      PT LONG TERM GOAL #3   Title Pt will improve BERG to >/= 46/56 to decrease falls risk    Time 12    Period Weeks    Status Revised    Target Date 01/02/20      PT LONG TERM GOAL #4   Title Pt will ambulate x 200' outside over pavement and grass with supervision and cane to improve safety ambulating to wood working shop at home    Time 12    Period Weeks    Status Revised    Target Date 01/02/20      PT LONG TERM GOAL #5   Title Pt will negotiate 8 stairs with one rail and cane MOD I for safe home entry/exit with cane    Time 12    Period Weeks    Status Revised    Target Date 01/02/20                 Plan - 11/10/19 0853    Clinical Impression Statement Today's skilled session continued address stepping strategies, weight shifting and strengthening with ex's and gait. Rest breaks taken throughout as needed due to fatigue. No knee pain or popping reported with session. Pt has been referred by primary MD to an orthopedic Md due to knee issues. Pt has not heard from an ortho Md office at this time. Stated "I probably won't even go". Encouraged pt to go as he continues to have issues at times, especially with knee flexion/extension/certain positions. Pt reluctantly agreed. The pt should benefit from continued PT to progress toward unmet goal.    Comorbidities multiple falls, vitamin D insufficiency, Type 1 IDDM, diabetic retinopathy, hypothyroidism, HTN, hyperlipidemia, R tibia fracture, L ankle fracture with ORIF    Rehab Potential Good    PT Frequency 2x / week    PT Duration 12 weeks    PT Treatment/Interventions ADLs/Self Care Home Management;Aquatic Therapy;Electrical Stimulation;DME Instruction;Gait training;Stair training;Functional mobility  training;Therapeutic activities;Therapeutic exercise;Balance training;Neuromuscular re-education;Patient/family education;Orthotic Fit/Training;Passive range of motion;Energy conservation    PT Next Visit Plan Continues standing activities with use of trekking poles to decrease UE support as well as gait when have 2cd person for safety, otherwise use single UE support on bar/counter. Continue activities to promote more left hip/knee flexion, continue to work on static standing balance with no UE support and hip/core strengthening in standing so not to aggrivate knee    Consulted and Agree with Plan of Care Patient           Patient will benefit from skilled therapeutic intervention in order to improve the following deficits and impairments:  Abnormal gait, Decreased balance, Decreased endurance, Decreased coordination, Decreased range of motion, Decreased strength, Difficulty walking, Impaired tone, Postural dysfunction, Decreased activity tolerance, Decreased mobility  Visit Diagnosis: Other abnormalities of gait and mobility  Muscle weakness (generalized)  Unsteadiness on feet  Difficulty in walking, not elsewhere classified  Foot drop, right     Problem List Patient Active Problem List   Diagnosis Date Noted   Gait disturbance 08/19/2019   Acquired diplegia ([place]) 08/19/2019   Vitamin D insufficiency 11/29/2016   Hypogonadism in male 11/01/2016   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[place])    Hypothyroidism    Hypertension    Hyperlipidemia    Closed fracture of right fibula and tibia 10/29/2016   Closed tibia fracture 10/29/2016   Closed fracture of right tibial plafond with  fibula involvement 10/29/2016   Abnormal liver function tests 03/06/2013   RASH AND OTHER NONSPECIFIC SKIN ERUPTION 10/07/2009   SHOULDER PAIN, LEFT 12/27/2008   Type I (juvenile type) diabetes mellitus without mention of complication, uncontrolled 12/03/2007    HYPOTHYROIDISM 11/12/2007   HYPERCHOLESTEROLEMIA 11/12/2007   MULTIPLE SCLEROSIS 08/29/2007   PROLIFERATIVE DIABETIC RETINOPATHY 08/29/2007   HYPERTENSION 12/25/2006    Willow Ora, PTA, [facility] Outpatient Neuro [facility] [address] [place], [place] [address] [phone] 11/10/19, 1:42 PM   Name: Johnathan Ross MRN: [id number] Date of Birth: 1970-12-05

## 2019-11-12 ENCOUNTER — Ambulatory Visit: Payer: Medicare Other | Admitting: Physical Therapy

## 2019-11-12 ENCOUNTER — Other Ambulatory Visit: Payer: Self-pay

## 2019-11-12 ENCOUNTER — Encounter: Payer: Self-pay | Admitting: Physical Therapy

## 2019-11-12 DIAGNOSIS — R296 Repeated falls: Secondary | ICD-10-CM

## 2019-11-12 DIAGNOSIS — M21371 Foot drop, right foot: Secondary | ICD-10-CM | POA: Diagnosis not present

## 2019-11-12 DIAGNOSIS — R2681 Unsteadiness on feet: Secondary | ICD-10-CM

## 2019-11-12 DIAGNOSIS — R2689 Other abnormalities of gait and mobility: Secondary | ICD-10-CM | POA: Diagnosis not present

## 2019-11-12 DIAGNOSIS — M6281 Muscle weakness (generalized): Secondary | ICD-10-CM

## 2019-11-12 DIAGNOSIS — R262 Difficulty in walking, not elsewhere classified: Secondary | ICD-10-CM

## 2019-11-12 NOTE — Therapy (Signed)
[facility] [address] [place], [place], [address] Phone: [phone]   Fax:  [phone]  Physical Therapy Treatment  Patient Details  Name: Johnathan Ross MRN: [id number] Date of Birth: 02-Nov-1970 Referring Provider (PT): Carylon Perches, MD   Encounter Date: 11/12/2019   PT End of Session - 11/12/19 0930    Visit Number 50    Number of Visits 33    Date for PT Re-Evaluation 01/02/20    Authorization Type Medicare Part A and B; 10th visit PN    Progress Note Due on Visit 36    PT Start Time 0845    PT Stop Time 0927    PT Time Calculation (min) 42 min    Equipment Utilized During Treatment Other (comment)   trekking poles   Activity Tolerance Patient tolerated treatment well;Patient limited by pain    Behavior During Therapy [facility] for tasks assessed/performed           Past Medical History:  Diagnosis Date  . Hyperlipidemia   . Hypertension   . Hypogonadism in male 11/01/2016  . Hypothyroidism   . Multiple sclerosis ([place])   . Proliferative diabetic retinopathy(362.02)   . Type 1 diabetes mellitus ([place]) dx'd 1994  . Vitamin D insufficiency 11/29/2016    Past Surgical History:  Procedure Laterality Date  . EYE SURGERY Bilateral    "laser for diabetic retinopathy"  . FRACTURE SURGERY    . OPEN REDUCTION INTERNAL FIXATION (ORIF) TIBIA/FIBULA FRACTURE Right 10/30/2016  . ORIF ANKLE FRACTURE Left 2015  . ORIF TIBIA FRACTURE Right 10/30/2016   Procedure: OPEN REDUCTION INTERNAL FIXATION (ORIF) TIBIA FIBULA FRACTURE;  Surgeon: Altamese [place]e, MD;  Location: [facility];  Service: Orthopedics;  Laterality: Right;  . RETINAL LASER PROCEDURE Bilateral    "for diabetic retinopathy"    There were no vitals filed for this visit.   Subjective Assessment - 11/12/19 0853    Subjective Has an appointment with Sports Medicine on Monday.  Pain is improving but still popping.    Patient is accompained by: Family member    mom in car   Pertinent History multiple falls, vitamin D insufficiency, Type 1 IDDM, diabetic retinopathy, hypothyroidism, HTN, hyperlipidemia, R tibia fracture, L ankle fracture with ORIF    Limitations Walking    Diagnostic tests No new lesions seen on MRI    Patient Stated Goals To improve walking    Currently in Pain? No/denies                             [facility] Adult PT Treatment/Exercise - 11/12/19 2048      Ambulation/Gait   Ambulation/Gait Yes    Ambulation/Gait Assistance 4: Min assist    Ambulation/Gait Assistance Details at end of session with trekking poles with therapist providing cues for reciprocal UE and LE advancement, for UE extension pushing into ground to facilitate core activation for more upright posture.  Also provided cues for decreased step length with LLE with anterior weight shift of pelvis over L stance LE to prevent genu recurvatum.    Ambulation Distance (Feet) 100 Feet    Assistive device Other (Comment)   bilateral trekking poles   Gait Pattern Step-through pattern;Poor foot clearance - left;Left genu recurvatum;Decreased hip/knee flexion - right;Decreased hip/knee flexion - left;Decreased dorsiflexion - right;Poor foot clearance - right;Narrow base of support    Ambulation Surface Level;Indoor      Knee/Hip Exercises: Sidelying  Hip ABduction Strengthening;Right;Left;2 sets;10 reps    Hip ABduction Limitations open chain hip ABD strength against resistance of red theraband with knee slightly flexed    Other Sidelying Knee/Hip Exercises Strengthening, Right; Left; 2 sets x 10 reps open chain hip ABD with hip/knee flexion with therapist providing cues for breathing sequence to increase core activation and decrease extensor tone                    PT Short Term Goals - 11/12/19 2103      PT SHORT TERM GOAL #1   Title Patient will demonstrate independence with updated HEP    Baseline 10/30/19: met with current progam which pt  reports is stil challenging    Time 4    Period Weeks    Status Revised    Target Date 12/03/19      PT SHORT TERM GOAL #2   Title Pt will improve gait velocity with RW to >/= 0.8 ft/sec    Baseline 10/30/19: 104.46= 0.31 ft/sec with RW    Time 4    Period Weeks    Status Revised    Target Date 12/03/19      PT SHORT TERM GOAL #3   Title Pt will improve BERG to >/= 42/56    Baseline 10/30/19: 38/56 scored today, decrease by 2 points from last assessment    Time 4    Period Weeks    Status New    Target Date 12/03/19      PT SHORT TERM GOAL #4   Title Pt will ambulate x 230' over flat, indoor surfaces with trekking poles with min A and intermittent standing rest breaks.    Baseline Pt currently ambulating with RW, trialed bilateral trekking poles today and min assist +2 for 20'    Time 4    Period Weeks    Status Revised    Target Date 12/03/19      PT SHORT TERM GOAL #5   Title Pt will negotate 8 stairs with two rails step alternating sequence with supervision    Baseline 11/06/19 pt performed reciprocal pattern negotiating 4 steps supervisoin with bilateral rails    Time 4    Period Weeks    Status Revised    Target Date 12/03/19             PT Long Term Goals - 10/04/19 1811      PT LONG TERM GOAL #1   Title Pt will demonstrate independence with land HEP    Time 12    Period Weeks    Status Revised    Target Date 01/02/20      PT LONG TERM GOAL #2   Title Pt will improve gait velocity with cane to >/= 0.8 ft/sec    Baseline Not assessed with cane    Time 12    Period Weeks    Status Revised    Target Date 01/02/20      PT LONG TERM GOAL #3   Title Pt will improve BERG to >/= 46/56 to decrease falls risk    Time 12    Period Weeks    Status Revised    Target Date 01/02/20      PT LONG TERM GOAL #4   Title Pt will ambulate x 200' outside over pavement and grass with supervision and cane to improve safety ambulating to wood working shop at home    Time  12    Period Weeks  Status Revised    Target Date 01/02/20      PT LONG TERM GOAL #5   Title Pt will negotiate 8 stairs with one rail and cane MOD I for safe home entry/exit with cane    Time 12    Period Weeks    Status Revised    Target Date 01/02/20                 Plan - 11/12/19 2100    Clinical Impression Statement Continued to focus on and progress hip ABD, core and hip/knee flexion strengthening in sidelying by adding resistance of red theraband and continued focus on breathing sequence for increased core activation.  Also continued to focus on gait training with trekking poles to facilitate increased UE extension with core activation for increased upright trunk and proximal stability during gait; pt able to ambulate greater distance today with only min A.  Demonstrated intermittent LLE genu recurvatum or scissoring with fatigue.  Will continue to address and progress towards LTG.    Comorbidities multiple falls, vitamin D insufficiency, Type 1 IDDM, diabetic retinopathy, hypothyroidism, HTN, hyperlipidemia, R tibia fracture, L ankle fracture with ORIF    Rehab Potential Good    PT Frequency 2x / week    PT Duration 12 weeks    PT Treatment/Interventions ADLs/Self Care Home Management;Aquatic Therapy;Electrical Stimulation;DME Instruction;Gait training;Stair training;Functional mobility training;Therapeutic activities;Therapeutic exercise;Balance training;Neuromuscular re-education;Patient/family education;Orthotic Fit/Training;Passive range of motion;Energy conservation    PT Next Visit Plan How was appointment with Sports Medicine?  Continues standing activities with use of trekking poles to decrease UE support as well as gait when have 2cd person for safety, otherwise use single UE support on bar/counter. Continue activities to promote more left hip/knee flexion, continue to work on static standing balance with no UE support and hip/core strengthening in standing so not to  aggrivate knee    Consulted and Agree with Plan of Care Patient           Patient will benefit from skilled therapeutic intervention in order to improve the following deficits and impairments:  Abnormal gait, Decreased balance, Decreased endurance, Decreased coordination, Decreased range of motion, Decreased strength, Difficulty walking, Impaired tone, Postural dysfunction, Decreased activity tolerance, Decreased mobility  Visit Diagnosis: Other abnormalities of gait and mobility  Muscle weakness (generalized)  Unsteadiness on feet  Difficulty in walking, not elsewhere classified  Foot drop, right  Repeated falls     Problem List Patient Active Problem List   Diagnosis Date Noted  . Gait disturbance 08/19/2019  . Acquired diplegia ([place]) 08/19/2019  . Vitamin D insufficiency 11/29/2016  . Hypogonadism in male 11/01/2016  . 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[place])   . Hypothyroidism   . Hypertension   . Hyperlipidemia   . Closed fracture of right fibula and tibia 10/29/2016  . Closed tibia fracture 10/29/2016  . Closed fracture of right tibial plafond with fibula involvement 10/29/2016  . Abnormal liver function tests 03/06/2013  . RASH AND OTHER NONSPECIFIC SKIN ERUPTION 10/07/2009  . SHOULDER PAIN, LEFT 12/27/2008  . Type I (juvenile type) diabetes mellitus without mention of complication, uncontrolled 12/03/2007  . HYPOTHYROIDISM 11/12/2007  . HYPERCHOLESTEROLEMIA 11/12/2007  . MULTIPLE SCLEROSIS 08/29/2007  . PROLIFERATIVE DIABETIC RETINOPATHY 08/29/2007  . HYPERTENSION 12/25/2006    [name], PT, DPT 11/12/19    9:07 PM    [facility] [address] [place], [place], [address] Phone: [phone]   Fax:  [phone]  Name: Johnathan Ross MRN: [id number] Date of Birth: 1970/07/01

## 2019-11-13 ENCOUNTER — Other Ambulatory Visit: Payer: Self-pay

## 2019-11-13 ENCOUNTER — Telehealth: Payer: Self-pay | Admitting: Endocrinology

## 2019-11-13 ENCOUNTER — Other Ambulatory Visit: Payer: Self-pay | Admitting: Endocrinology

## 2019-11-13 DIAGNOSIS — I10 Essential (primary) hypertension: Secondary | ICD-10-CM

## 2019-11-13 DIAGNOSIS — E782 Mixed hyperlipidemia: Secondary | ICD-10-CM

## 2019-11-13 MED ORDER — LISINOPRIL 10 MG PO TABS: 10.0000 mg | ORAL_TABLET | Freq: Every day | ORAL | 0 refills | Status: DC

## 2019-11-13 MED ORDER — SIMVASTATIN 20 MG PO TABS: 20.0000 mg | ORAL_TABLET | Freq: Every day | ORAL | 0 refills | Status: DC

## 2019-11-13 NOTE — Telephone Encounter (Signed)
Medication Refill Request  Did you call your pharmacy and request this refill first? Yes . If patient has not contacted pharmacy first, instruct them to do so for future refills.  . Remind them that contacting the pharmacy for their refill is the quickest method to get the refill.  . Refill policy also stated that it will take anywhere between 24-72 hours to receive the refill.    Name of medication?  lisinopril (ZESTRIL) 10 MG tablet  AND simvastatin (ZOCOR) 20 MG tablet   Is this a 90 day supply? Not sure  Name and location of pharmacy [id number] - [place], [place] - [place] Phone:  [phone]  Fax:  [phone]       . Is the request for diabetes test strips? No . If yes, what brand? N/A

## 2019-11-13 NOTE — Telephone Encounter (Signed)
E-Prescribing Status: Receipt confirmed by pharmacy (11/13/2019  2:38 PM EDT)

## 2019-11-16 ENCOUNTER — Other Ambulatory Visit: Payer: Self-pay

## 2019-11-16 ENCOUNTER — Ambulatory Visit (INDEPENDENT_AMBULATORY_CARE_PROVIDER_SITE_OTHER): Payer: Medicare Other | Admitting: Family Medicine

## 2019-11-16 ENCOUNTER — Ambulatory Visit: Payer: Self-pay

## 2019-11-16 ENCOUNTER — Encounter: Payer: Self-pay | Admitting: Family Medicine

## 2019-11-16 VITALS — BP 112/82 | HR 97 | Ht 73.0 in | Wt 197.0 lb

## 2019-11-16 DIAGNOSIS — M25561 Pain in right knee: Secondary | ICD-10-CM

## 2019-11-16 NOTE — Progress Notes (Signed)
Subjective:    CC: R knee pain  I, Johnathan Ross, LAT, ATC, am serving as scribe for Dr. Lynne Leader.  HPI: Pt is a 49 y/o male presenting w/ c/o R knee pain for one month after sleeping on the floor.  Mentions having to crawl on knees up his rock driveway one month ago as well. He locates his pain to medial aspect of right knee .  Of note, pt has MS and has been recently attending PT for this. Notes a popping sensation. Slept on floor when pain started.  He notes his knee is not hardly hurting at all anymore.  He is here mostly because the knee is popping and his physical therapist wanted him to get checked out.  He notes his legs are weak that he attributes to MS.  MS has has been stable for years without any recent flares.    Diagnostic imaging: R knee XR- 12/02/17  Pertinent review of Systems: No fevers or chills  Relevant historical information: Multiple sclerosis and type 1 diabetes   Objective:    Vitals:   11/16/19 0926  BP: 112/82  Pulse: 97  SpO2: 98%   General: Well Developed, well nourished, and in no acute distress.   MSK:  Right knee slightly swollen anterior knee. Range of motion full with crepitation. Minimally tender overlying patella.  Minimally tender at medial joint line. Stable ligamentous exam. Negative Murray's test. Strength diminished flexion and extension.  Lab and Radiology Results  Diagnostic Limited MSK Ultrasound of: Right knee Quad tendon intact normal-appearing Trace joint effusion present superior patellar space. Patellar tendon is intact and normal appearing Hypoechoic fluid tracking in subcutaneous tissue superficial to patella consistent with mild prepatellar bursitis. Lateral meniscus with partially extruded lateral meniscus. Medial joint line slightly narrowed with again partially extruded medial meniscus. Posterior knee no Baker's cyst Impression: Prepatellar bursitis, and probable meniscus tear    Impression and  Recommendations:    Assessment and Plan: 49 y.o. male with right knee mechanical symptoms without locking or catching.  Patient likely does have degenerative meniscus tears based on MRI findings however he is not very symptomatic with them.  He does not have much pain at all any longer and his mechanical symptoms are quite mild.  Discussed that the proper management for mechanical knee symptoms is surgery if needed.  He thinks his symptoms are not bad enough at this time to warrant surgery or even an injection.  He would like to proceed with continued conservative management which I think is very reasonable.  Based on this discussion we will hold off on further advanced imaging such as MRI for now.  As for the prepatellar bursitis recommend Voltaren gel.  This is already improving on its own will not require much treatment I think in the future.   Check back as needed.Marland Kitchen  PDMP not reviewed this encounter. Orders Placed This Encounter  Procedures  . [place] LIMITED JOINT SPACE STRUCTURES LOW RIGHT(NO LINKED CHARGES)    Order Specific Question:   Reason for Exam (SYMPTOM  OR DIAGNOSIS REQUIRED)    Answer:   right knee pain    Order Specific Question:   Preferred imaging location?    Answer:   [place]   No orders of the defined types were placed in this encounter.   Discussed warning signs or symptoms. Please see discharge instructions. Patient expresses understanding.   The above documentation has been reviewed and is accurate and complete Lynne Leader,  M.D.   

## 2019-11-16 NOTE — Patient Instructions (Signed)
Thank you for coming in today. Ok to use voltaren gel on the knee up to4x daily for swelling or pain.  I think you may have a mensicus tear but it is probably not bad enough to have surgery so we can give it some time to improve.  I think you also have prepatellar bursitis.   You are hearing grinding coming from the underside of the knee cap. That is called patellofemoral chondromalacia.   Recheck with me as needed.  There is more for me to do if needed.   Continue PT.

## 2019-11-17 ENCOUNTER — Ambulatory Visit: Payer: Medicare Other | Admitting: Physical Therapy

## 2019-11-18 ENCOUNTER — Other Ambulatory Visit: Payer: Self-pay

## 2019-11-18 ENCOUNTER — Encounter: Payer: Self-pay | Admitting: Physical Therapy

## 2019-11-18 ENCOUNTER — Ambulatory Visit: Payer: Medicare Other | Admitting: Physical Therapy

## 2019-11-18 DIAGNOSIS — R2689 Other abnormalities of gait and mobility: Secondary | ICD-10-CM | POA: Diagnosis not present

## 2019-11-18 DIAGNOSIS — M21371 Foot drop, right foot: Secondary | ICD-10-CM

## 2019-11-18 DIAGNOSIS — R296 Repeated falls: Secondary | ICD-10-CM | POA: Diagnosis not present

## 2019-11-18 DIAGNOSIS — R262 Difficulty in walking, not elsewhere classified: Secondary | ICD-10-CM | POA: Diagnosis not present

## 2019-11-18 DIAGNOSIS — R2681 Unsteadiness on feet: Secondary | ICD-10-CM | POA: Diagnosis not present

## 2019-11-18 DIAGNOSIS — M6281 Muscle weakness (generalized): Secondary | ICD-10-CM | POA: Diagnosis not present

## 2019-11-18 NOTE — Therapy (Signed)
[facility] [address] [place], [place], [address] Phone: [phone]   Fax:  [phone]  Physical Therapy Treatment  Patient Details  Name: Johnathan Ross MRN: [id number] Date of Birth: [birth year] Referring Provider (PT): Carylon Perches, MD   Encounter Date: 11/18/2019   PT End of Session - 11/18/19 0857    Visit Number 65    Number of Visits 56    Date for PT Re-Evaluation 01/02/20    Authorization Type Medicare Part A and B; 10th visit PN    Progress Note Due on Visit 41    PT Start Time 0849    PT Stop Time 0930    PT Time Calculation (min) 41 min    Equipment Utilized During Treatment Gait belt    Activity Tolerance Patient tolerated treatment well;Patient limited by fatigue;Other (comment)   limited by increased tone in left LE this session   Behavior During Therapy [facility] for tasks assessed/performed           Past Medical History:  Diagnosis Date   Hyperlipidemia    Hypertension    Hypogonadism in male 11/01/2016   Hypothyroidism    Multiple sclerosis ([place])    Proliferative diabetic retinopathy(362.02)    Type 1 diabetes mellitus ([place]) dx'd 1994   Vitamin D insufficiency 11/29/2016    Past Surgical History:  Procedure Laterality Date   EYE SURGERY Bilateral    "laser for diabetic retinopathy"   FRACTURE SURGERY     OPEN REDUCTION INTERNAL FIXATION (ORIF) TIBIA/FIBULA FRACTURE Right 10/30/2016   ORIF ANKLE FRACTURE Left 2015   ORIF TIBIA FRACTURE Right 10/30/2016   Procedure: OPEN REDUCTION INTERNAL FIXATION (ORIF) TIBIA FIBULA FRACTURE;  Surgeon: Altamese Walnut, MD;  Location: [facility];  Service: Orthopedics;  Laterality: Right;   RETINAL LASER PROCEDURE Bilateral    "for diabetic retinopathy"    There were no vitals filed for this visit.   Subjective Assessment - 11/18/19 0856    Subjective Saw MD with Sports Medicine.    Patient is accompained by: Family member   mom in car    Pertinent History multiple falls, vitamin D insufficiency, Type 1 IDDM, diabetic retinopathy, hypothyroidism, HTN, hyperlipidemia, R tibia fracture, L ankle fracture with ORIF    Limitations Walking    Diagnostic tests No new lesions seen on MRI    Currently in Pain? No/denies                [facility] Adult PT Treatment/Exercise - 11/18/19 0909      Transfers   Transfers Sit to Stand;Stand to Sit    Sit to Stand 5: Supervision;With upper extremity assist;From bed;From chair/3-in-1    Stand to Sit 5: Supervision;With upper extremity assist;To bed;To chair/3-in-1      Ambulation/Gait   Ambulation/Gait Yes    Ambulation/Gait Assistance 5: Supervision    Ambulation/Gait Assistance Details around gym. several rest breaks needed due to left LE extensor tone    Assistive device Rolling walker;Other (Comment)   right AFO   Gait Pattern Step-through pattern;Poor foot clearance - left;Left genu recurvatum;Decreased hip/knee flexion - right;Decreased hip/knee flexion - left;Decreased dorsiflexion - right;Poor foot clearance - right;Narrow base of support    Ambulation Surface Level;Indoor      Self-Care   Self-Care Other Self-Care Comments    Other Self-Care Comments  reviewed and provided written information on Chrondromalacia Patella and small Menisucs tears due to pt's recent ortho diagnoses. Pt verbalized improved understanding after review in session  today.       Neuro Re-ed    Neuro Re-ed Details  for strengthening/muscle re-ed: with green band around LE's in parallel bars with UE support on bars- side stepping in squat position, fwd/bwd diagonal stepping (electric slide) in squat position, wide stance in squat position for fwd/bwd walking. all for 3 laps each way with light UE support on bars, cues to keep knees bent/"soft". cues/failitation for weight shifting onto right to allow for clearance of left LE wiht knee bent. limited by extensor tone.                    PT Education -  11/18/19 2220    Education Details see self care section    Person(s) Educated Patient    Methods Explanation;Verbal cues;Handout    Comprehension Verbalized understanding;Returned demonstration            PT Short Term Goals - 11/12/19 2103      PT SHORT TERM GOAL #1   Title Patient will demonstrate independence with updated HEP    Baseline 10/30/19: met with current progam which pt reports is stil challenging    Time 4    Period Weeks    Status Revised    Target Date 12/03/19      PT SHORT TERM GOAL #2   Title Pt will improve gait velocity with RW to >/= 0.8 ft/sec    Baseline 10/30/19: 104.46= 0.31 ft/sec with RW    Time 4    Period Weeks    Status Revised    Target Date 12/03/19      PT SHORT TERM GOAL #3   Title Pt will improve BERG to >/= 42/56    Baseline 10/30/19: 38/56 scored today, decrease by 2 points from last assessment    Time 4    Period Weeks    Status New    Target Date 12/03/19      PT SHORT TERM GOAL #4   Title Pt will ambulate x 230' over flat, indoor surfaces with trekking poles with min A and intermittent standing rest breaks.    Baseline Pt currently ambulating with RW, trialed bilateral trekking poles today and min assist +2 for 20'    Time 4    Period Weeks    Status Revised    Target Date 12/03/19      PT SHORT TERM GOAL #5   Title Pt will negotate 8 stairs with two rails step alternating sequence with supervision    Baseline 11/06/19 pt performed reciprocal pattern negotiating 4 steps supervisoin with bilateral rails    Time 4    Period Weeks    Status Revised    Target Date 12/03/19             PT Long Term Goals - 10/04/19 1811      PT LONG TERM GOAL #1   Title Pt will demonstrate independence with land HEP    Time 12    Period Weeks    Status Revised    Target Date 01/02/20      PT LONG TERM GOAL #2   Title Pt will improve gait velocity with cane to >/= 0.8 ft/sec    Baseline Not assessed with cane    Time 12    Period Weeks     Status Revised    Target Date 01/02/20      PT LONG TERM GOAL #3   Title Pt will improve BERG to >/= 46/56 to  decrease falls risk    Time 12    Period Weeks    Status Revised    Target Date 01/02/20      PT LONG TERM GOAL #4   Title Pt will ambulate x 200' outside over pavement and grass with supervision and cane to improve safety ambulating to wood working shop at home    Time 12    Period Weeks    Status Revised    Target Date 01/02/20      PT LONG TERM GOAL #5   Title Pt will negotiate 8 stairs with one rail and cane MOD I for safe home entry/exit with cane    Time 12    Period Weeks    Status Revised    Target Date 01/02/20                 Plan - 11/18/19 0857    Clinical Impression Statement Today's skilled session ininitally focused on providing education/handouts on pt's recent ortho diagnoses for his knee as the pt had multiple questions, reporting the MD did not go into any details. Pt verbalizing increased understanding after review of material in session. Remainder of session continued to focus on LE strengthening with emphasis on knee flexion to break/decrease extensor tone. The pt should benefit from continued PT to progress toward unmet goals.    Comorbidities multiple falls, vitamin D insufficiency, Type 1 IDDM, diabetic retinopathy, hypothyroidism, HTN, hyperlipidemia, R tibia fracture, L ankle fracture with ORIF    Rehab Potential Good    PT Frequency 2x / week    PT Duration 12 weeks    PT Treatment/Interventions ADLs/Self Care Home Management;Aquatic Therapy;Electrical Stimulation;DME Instruction;Gait training;Stair training;Functional mobility training;Therapeutic activities;Therapeutic exercise;Balance training;Neuromuscular re-education;Patient/family education;Orthotic Fit/Training;Passive range of motion;Energy conservation    PT Next Visit Plan Have pt schedule out through end of this cert period; Continues standing activities with use of trekking  poles to decrease UE support as well as gait when have 2cd person for safety, otherwise use single UE support on bar/counter. Continue activities to promote more left hip/knee flexion, continue to work on static standing balance with no UE support and hip/core strengthening in standing so not to aggrivate knee    Consulted and Agree with Plan of Care Patient           Patient will benefit from skilled therapeutic intervention in order to improve the following deficits and impairments:  Abnormal gait, Decreased balance, Decreased endurance, Decreased coordination, Decreased range of motion, Decreased strength, Difficulty walking, Impaired tone, Postural dysfunction, Decreased activity tolerance, Decreased mobility  Visit Diagnosis: Muscle weakness (generalized)  Unsteadiness on feet  Difficulty in walking, not elsewhere classified  Other abnormalities of gait and mobility  Foot drop, right     Problem List Patient Active Problem List   Diagnosis Date Noted   Gait disturbance 08/19/2019   Acquired diplegia ([place]) 08/19/2019   Vitamin D insufficiency 11/29/2016   Hypogonadism in male 11/01/2016   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[place])    Hypothyroidism    Hypertension    Hyperlipidemia    Closed fracture of right fibula and tibia 10/29/2016   Closed tibia fracture 10/29/2016   Closed fracture of right tibial plafond with fibula involvement 10/29/2016   Abnormal liver function tests 03/06/2013   RASH AND OTHER NONSPECIFIC SKIN ERUPTION 10/07/2009   SHOULDER PAIN, LEFT 12/27/2008   Type I (juvenile type) diabetes mellitus without mention of complication, uncontrolled 12/03/2007   HYPOTHYROIDISM 11/12/2007  HYPERCHOLESTEROLEMIA 11/12/2007   MULTIPLE SCLEROSIS 08/29/2007   PROLIFERATIVE DIABETIC RETINOPATHY 08/29/2007   HYPERTENSION 12/25/2006    Willow Ora, PTA, [facility] [address] [place], [place] [address] [phone] 11/18/19, 10:22 PM   Name: Johnathan Ross MRN: [id number] Date of Birth: 08-07-1970

## 2019-11-18 NOTE — Patient Instructions (Addendum)
Meniscus Tear    A meniscus tear is a knee injury that happens when a piece of the meniscus is torn. The meniscus is a thick, rubbery, wedge-shaped cartilage in the knee. Two menisci are located in each knee. They sit between the upper bone (femur) and lower bone (tibia) that make up the knee joint. Each meniscus acts as a shock absorber for the knee.  A torn meniscus is one of the most common types of knee injuries. This injury can range from mild to severe. Surgery may be needed to repair a severe tear.  What are the causes?  This condition may be caused by any kneeling, squatting, twisting, or pivoting movement. Sports-related injuries are the most common cause. These often occur from:  · Running and stopping suddenly.  ? Changing direction.  ? Being tackled or knocked off your feet.  · Lifting or carrying heavy weights.  As people get older, their menisci get thinner and weaker. In these people, tears can happen more easily, such as from climbing stairs.  What increases the risk?  You are more likely to develop this condition if you:  · Play contact sports.  · Have a job that requires kneeling or squatting.  · Are male.  · Are over 40 years old.  What are the signs or symptoms?  Symptoms of this condition include:  · Knee pain, especially at the side of the knee joint. You may feel pain when the injury occurs, or you may only hear a pop and feel pain later.  · A feeling that your knee is clicking, catching, locking, or giving way.  · Not being able to fully bend or extend your knee.  · Bruising or swelling in your knee.  How is this diagnosed?  This condition may be diagnosed based on your symptoms and a physical exam.  You may also have tests, such as:  · X-rays.  · MRI.  · A procedure to look inside your knee with a narrow surgical telescope (arthroscopy).  You may be referred to a knee specialist (orthopedic surgeon).  How is this treated?  Treatment for this injury depends on the severity of the tear.  Treatment for a mild tear may include:  · Rest.  · Medicine to reduce pain and swelling. This is usually a nonsteroidal anti-inflammatory drug (NSAID), like ibuprofen.  · A knee brace, sleeve, or wrap.  · Using crutches or a walker to keep weight off your knee and to help you walk.  · Exercises to strengthen your knee (physical therapy).  You may need surgery if you have a severe tear or if other treatments are not working.  Follow these instructions at home:  If you have a brace, sleeve, or wrap:  · Wear it as told by your health care provider. Remove it only as told by your health care provider.  · Loosen the brace, sleeve, or wrap if your toes tingle, become numb, or turn cold and blue.  · Keep the brace, sleeve, or wrap clean and dry.  · If the brace, sleeve, or wrap is not waterproof:  ? Do not let it get wet.  ? Cover it with a watertight covering when you take a bath or shower.  Managing pain and swelling    · Take over-the-counter and prescription medicines only as told by your health care provider.  · If directed, put ice on your knee:  ? If you have a removable brace, sleeve, or wrap, remove it   elevate) the injured area above the level of your heart while you are sitting or lying down. Activity  Do not use the injured limb to support your body weight until your health care provider says that you can. Use crutches or a walker as told by your health care provider.  Return to your normal activities as told by your health care provider. Ask your health care provider what activities are safe for you.  Perform range-of-motion exercises only as told by your health care provider.  Begin doing exercises to strengthen your knee and leg muscles only as told by your  health care provider. After you recover, your health care provider may recommend these exercises to help prevent another injury. General instructions  Use a knee brace, sleeve, or wrap as told by your health care provider.  Ask your health care provider when it is safe to drive if you have a brace, sleeve, or wrap on your knee.  Do not use any products that contain nicotine or tobacco, such as cigarettes, e-cigarettes, and chewing tobacco. If you need help quitting, ask your health care provider.  Ask your health care provider if the medicine prescribed to you: ? Requires you to avoid driving or using heavy machinery. ? Can cause constipation. You may need to take these actions to prevent or treat constipation:  Drink enough fluid to keep your urine pale yellow.  Take over-the-counter or prescription medicines.  Eat foods that are high in fiber, such as beans, whole grains, and fresh fruits and vegetables.  Limit foods that are high in fat and processed sugars, such as fried or sweet foods.  Keep all follow-up visits as told by your health care provider. This is important. Contact a health care provider if:  You have a fever.  Your knee becomes red, tender, or swollen.  Your pain medicine is not helping.  Your symptoms get worse or do not improve after 2 weeks of home care. Summary  A meniscus tear is a knee injury that happens when a piece of the meniscus is torn.  Treatment for this injury depends on the severity of the tear. You may need surgery if you have a severe tear or if other treatments are not working.  Rest, ice, and raise (elevate) your injured knee as told by your health care provider. This will help lessen pain and swelling.  Contact a health care provider if you have new symptoms, or your symptoms get worse or do not improve after 2 weeks of home care.  Keep all follow-up visits as told by your health care provider. This is important. This information is not  intended to replace advice given to you by your health care provider. Make sure you discuss any questions you have with your health care provider. Document Revised: 11/26/2017 Document Reviewed: 11/26/2017 Elsevier Patient Education  2020 [employer] [place].  Patellofemoral Pain Syndrome  Patellofemoral pain syndrome is a condition in which the tissue (cartilage) on the underside of the kneecap (patella) softens or breaks down. This causes pain in the front of the knee. The condition is also called runner's knee or chondromalacia patella. Patellofemoral pain syndrome is most common in young adults who are active in sports. The knee is the largest joint in the body. The patella covers the front of the knee and is attached to muscles above and below the knee. The underside of the patella is covered with a smooth type of cartilage (synovium). The smooth surface helps the patella to glide  easily when you move your knee. Patellofemoral pain syndrome causes swelling in the joint linings and bone surfaces in the knee. What are the causes? This condition may be caused by:  Overuse of the knee.  Poor alignment of your knee joints.  Weak leg muscles.  A direct blow to your kneecap. What increases the risk? You are more likely to develop this condition if:  You do a lot of activities that can wear down your kneecap. These include: ? Running. ? Squatting. ? Climbing stairs.  You start a new physical activity or exercise program.  You wear shoes that do not fit well.  You do not have good leg strength.  You are overweight. What are the signs or symptoms? The main symptom of this condition is knee pain. This may feel like a dull, aching pain underneath your patella, in the front of your knee. There may be a popping or cracking sound when you move your knee. Pain may get worse with:  Exercise.  Climbing stairs.  Running.  Jumping.  Squatting.  Kneeling.  Sitting for a long time.  Moving  or pushing on your patella. How is this diagnosed? This condition may be diagnosed based on:  Your symptoms and medical history. You may be asked about your recent physical activities and which ones cause knee pain.  A physical exam. This may include: ? Moving your patella back and forth. ? Checking your range of knee motion. ? Having you squat or jump to see if you have pain. ? Checking the strength of your leg muscles.  Imaging tests to confirm the diagnosis. These may include an MRI of your knee. How is this treated? This condition may be treated at home with rest, ice, compression, and elevation (RICE).  Other treatments may include:  Nonsteroidal anti-inflammatory drugs (NSAIDs).  Physical therapy to stretch and strengthen your leg muscles.  Shoe inserts (orthotics) to take stress off your knee.  A knee brace or knee support.  Adhesive tapes to the skin.  Surgery to remove damaged cartilage or move the patella to a better position. This is rare. Follow these instructions at home: If you have a shoe or brace:  Wear the shoe or brace as told by your health care provider. Remove it only as told by your health care provider.  Loosen the shoe or brace if your toes tingle, become numb, or turn cold and blue.  Keep the shoe or brace clean.  If the shoe or brace is not waterproof: ? Do not let it get wet. ? Cover it with a watertight covering when you take a bath or a shower. Managing pain, stiffness, and swelling  If directed, put ice on the painful area. ? If you have a removable shoe or brace, remove it as told by your health care provider. ? Put ice in a plastic bag. ? Place a towel between your skin and the bag. ? Leave the ice on for 20 minutes, 2-3 times a day.  Move your toes often to avoid stiffness and to lessen swelling.  Rest your knee: ? Avoid activities that cause knee pain. ? When sitting or lying down, raise (elevate) the injured area above the level  of your heart, whenever possible. General instructions  Take over-the-counter and prescription medicines only as told by your health care provider.  Use splints, braces, knee supports, or walking aids as directed by your health care provider.  Perform stretching and strengthening exercises as told by your health  care provider or physical therapist.  Do not use any products that contain nicotine or tobacco, such as cigarettes and e-cigarettes. These can delay healing. If you need help quitting, ask your health care provider.  Return to your normal activities as told by your health care provider. Ask your health care provider what activities are safe for you.  Keep all follow-up visits as told by your health care provider. This is important. Contact a health care provider if:  Your symptoms get worse.  You are not improving with home care. Summary  Patellofemoral pain syndrome is a condition in which the tissue (cartilage) on the underside of the kneecap (patella) softens or breaks down.  This condition causes swelling in the joint linings and bone surfaces in the knee. This leads to pain in the front of the knee.  This condition may be treated at home with rest, ice, compression, and elevation (RICE).  Use splints, braces, knee supports, or walking aids as directed by your health care provider. This information is not intended to replace advice given to you by your health care provider. Make sure you discuss any questions you have with your health care provider. Document Revised: 06/24/2017 Document Reviewed: 06/24/2017 Elsevier Patient Education  2020 [employer].

## 2019-11-19 ENCOUNTER — Ambulatory Visit: Payer: Medicare Other | Admitting: Physical Therapy

## 2019-11-24 ENCOUNTER — Other Ambulatory Visit: Payer: Self-pay

## 2019-11-24 ENCOUNTER — Ambulatory Visit: Payer: Medicare Other | Admitting: Physical Therapy

## 2019-11-24 ENCOUNTER — Encounter: Payer: Self-pay | Admitting: Physical Therapy

## 2019-11-24 DIAGNOSIS — R2689 Other abnormalities of gait and mobility: Secondary | ICD-10-CM

## 2019-11-24 DIAGNOSIS — R262 Difficulty in walking, not elsewhere classified: Secondary | ICD-10-CM

## 2019-11-24 DIAGNOSIS — M21371 Foot drop, right foot: Secondary | ICD-10-CM | POA: Diagnosis not present

## 2019-11-24 DIAGNOSIS — M6281 Muscle weakness (generalized): Secondary | ICD-10-CM

## 2019-11-24 DIAGNOSIS — R296 Repeated falls: Secondary | ICD-10-CM | POA: Diagnosis not present

## 2019-11-24 DIAGNOSIS — R2681 Unsteadiness on feet: Secondary | ICD-10-CM | POA: Diagnosis not present

## 2019-11-25 NOTE — Therapy (Signed)
[facility] [address] [place], [place], [address] Phone: [phone]   Fax:  [phone]  Physical Therapy Treatment  Patient Details  Name: Johnathan Ross MRN: [id number] Date of Birth: 1970-10-06 Referring Provider (PT): Carylon Perches, MD   Encounter Date: 11/24/2019   PT End of Session - 11/24/19 0848    Visit Number 54    Number of Visits 72    Date for PT Re-Evaluation 01/02/20    Authorization Type Medicare Part A and B; 10th visit PN    Progress Note Due on Visit 88    PT Start Time 0842    PT Stop Time 0930    PT Time Calculation (min) 48 min    Equipment Utilized During Treatment Gait belt    Activity Tolerance Patient tolerated treatment well;Patient limited by fatigue;Other (comment)   limited by increased tone in left LE this session   Behavior During Therapy [facility] for tasks assessed/performed           Past Medical History:  Diagnosis Date  . Hyperlipidemia   . Hypertension   . Hypogonadism in male 11/01/2016  . Hypothyroidism   . Multiple sclerosis ([place])   . Proliferative diabetic retinopathy(362.02)   . Type 1 diabetes mellitus ([place]) dx'd 1994  . Vitamin D insufficiency 11/29/2016    Past Surgical History:  Procedure Laterality Date  . EYE SURGERY Bilateral    "laser for diabetic retinopathy"  . FRACTURE SURGERY    . OPEN REDUCTION INTERNAL FIXATION (ORIF) TIBIA/FIBULA FRACTURE Right 10/30/2016  . ORIF ANKLE FRACTURE Left 2015  . ORIF TIBIA FRACTURE Right 10/30/2016   Procedure: OPEN REDUCTION INTERNAL FIXATION (ORIF) TIBIA FIBULA FRACTURE;  Surgeon: Altamese Morley, MD;  Location: [facility];  Service: Orthopedics;  Laterality: Right;  . RETINAL LASER PROCEDURE Bilateral    "for diabetic retinopathy"    There were no vitals filed for this visit.   Subjective Assessment - 11/24/19 0846    Subjective No new complaints. No falls. Still with some popping, no pain. May not be here Thurday due  to jury duty.    Patient is accompained by: Family member   mom in car   Pertinent History multiple falls, vitamin D insufficiency, Type 1 IDDM, diabetic retinopathy, hypothyroidism, HTN, hyperlipidemia, R tibia fracture, L ankle fracture with ORIF    Limitations Walking    Diagnostic tests No new lesions seen on MRI    Patient Stated Goals To improve walking    Currently in Pain? No/denies    Pain Score 0-No pain                 [facility] Adult PT Treatment/Exercise - 11/24/19 0851      Transfers   Transfers Sit to Stand;Stand to Sit    Sit to Stand 5: Supervision;With upper extremity assist;From bed;From chair/3-in-1    Stand to Sit 5: Supervision;With upper extremity assist;To bed;To chair/3-in-1      Ambulation/Gait   Ambulation/Gait Yes    Ambulation/Gait Assistance 5: Supervision    Ambulation/Gait Assistance Details cues needed for posture, decreased right lateral lean, sequencing with trecking poles and pole placement.     Ambulation Distance (Feet) 20 Feet   2   Assistive device Other (Comment)   trekking poles   Gait Pattern Step-through pattern;Poor foot clearance - left;Left genu recurvatum;Decreased hip/knee flexion - right;Decreased hip/knee flexion - left;Decreased dorsiflexion - right;Poor foot clearance - right;Narrow base of support    Ambulation Surface Level;Indoor  Neuro Re-ed    Neuro Re-ed Details  for strengthening/muscle re-ed: in quadruped- rocking fwd, then backward into flexion for 10 reps with cues on form, then moving LE fwd/bwd for 10 reps with assistance at foot needed for clearance with movement.       Knee/Hip Exercises: Prone   Hamstring Curl 2 sets;10 reps;Limitations    Hamstring Curl Limitations bil LE's no resistance for 1st set, then with yellow band resistance. quad stretching needed throughout both sets to decreased tone to allow for movements.     Other Prone Exercises quad stretch passive for 1-2 minute holds on both sides throughout  HS curls above to relax quads/decrease tone, allow for movements.                PT Short Term Goals - 11/12/19 2103      PT SHORT TERM GOAL #1   Title Patient will demonstrate independence with updated HEP    Baseline 10/30/19: met with current progam which pt reports is stil challenging    Time 4    Period Weeks    Status Revised    Target Date 12/03/19      PT SHORT TERM GOAL #2   Title Pt will improve gait velocity with RW to >/= 0.8 ft/sec    Baseline 10/30/19: 104.46= 0.31 ft/sec with RW    Time 4    Period Weeks    Status Revised    Target Date 12/03/19      PT SHORT TERM GOAL #3   Title Pt will improve BERG to >/= 42/56    Baseline 10/30/19: 38/56 scored today, decrease by 2 points from last assessment    Time 4    Period Weeks    Status New    Target Date 12/03/19      PT SHORT TERM GOAL #4   Title Pt will ambulate x 230' over flat, indoor surfaces with trekking poles with min A and intermittent standing rest breaks.    Baseline Pt currently ambulating with RW, trialed bilateral trekking poles today and min assist +2 for 20'    Time 4    Period Weeks    Status Revised    Target Date 12/03/19      PT SHORT TERM GOAL #5   Title Pt will negotate 8 stairs with two rails step alternating sequence with supervision    Baseline 11/06/19 pt performed reciprocal pattern negotiating 4 steps supervisoin with bilateral rails    Time 4    Period Weeks    Status Revised    Target Date 12/03/19             PT Long Term Goals - 10/04/19 1811      PT LONG TERM GOAL #1   Title Pt will demonstrate independence with land HEP    Time 12    Period Weeks    Status Revised    Target Date 01/02/20      PT LONG TERM GOAL #2   Title Pt will improve gait velocity with cane to >/= 0.8 ft/sec    Baseline Not assessed with cane    Time 12    Period Weeks    Status Revised    Target Date 01/02/20      PT LONG TERM GOAL #3   Title Pt will improve BERG to >/= 46/56 to  decrease falls risk    Time 12    Period Weeks    Status Revised  Target Date 01/02/20      PT LONG TERM GOAL #4   Title Pt will ambulate x 200' outside over pavement and grass with supervision and cane to improve safety ambulating to wood working shop at home    Time 12    Period Weeks    Status Revised    Target Date 01/02/20      PT LONG TERM GOAL #5   Title Pt will negotiate 8 stairs with one rail and cane MOD I for safe home entry/exit with cane    Time 12    Period Weeks    Status Revised    Target Date 01/02/20                 Plan - 11/24/19 0848    Clinical Impression Statement Today's skilled session continued to focus on LE/trunk flexion and gait with treckking poles. Continues to need up to min assist with gait with poles. The pt should benefit from contineud PT to progress toward unmet goals.    Comorbidities multiple falls, vitamin D insufficiency, Type 1 IDDM, diabetic retinopathy, hypothyroidism, HTN, hyperlipidemia, R tibia fracture, L ankle fracture with ORIF    Rehab Potential Good    PT Frequency 2x / week    PT Duration 12 weeks    PT Treatment/Interventions ADLs/Self Care Home Management;Aquatic Therapy;Electrical Stimulation;DME Instruction;Gait training;Stair training;Functional mobility training;Therapeutic activities;Therapeutic exercise;Balance training;Neuromuscular re-education;Patient/family education;Orthotic Fit/Training;Passive range of motion;Energy conservation    PT Next Visit Plan Continues standing activities with use of trekking poles to decrease UE support as well as gait when have 2cd person for safety, otherwise use single UE support on bar/counter. Continue activities to promote more left hip/knee flexion, continue to work on static standing balance with no UE support and hip/core strengthening in standing so not to aggrivate knee    Consulted and Agree with Plan of Care Patient           Patient will benefit from skilled  therapeutic intervention in order to improve the following deficits and impairments:  Abnormal gait, Decreased balance, Decreased endurance, Decreased coordination, Decreased range of motion, Decreased strength, Difficulty walking, Impaired tone, Postural dysfunction, Decreased activity tolerance, Decreased mobility  Visit Diagnosis: Muscle weakness (generalized)  Unsteadiness on feet  Difficulty in walking, not elsewhere classified  Other abnormalities of gait and mobility  Foot drop, right     Problem List Patient Active Problem List   Diagnosis Date Noted  . Gait disturbance 08/19/2019  . Acquired diplegia ([place]) 08/19/2019  . Vitamin D insufficiency 11/29/2016  . Hypogonadism in male 11/01/2016  . 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 Hypothyroidism   . Hypertension   . Hyperlipidemia   . Closed fracture of right fibula and tibia 10/29/2016  . Closed tibia fracture 10/29/2016  . Closed fracture of right tibial plafond with fibula involvement 10/29/2016  . Abnormal liver function tests 03/06/2013  . RASH AND OTHER NONSPECIFIC SKIN ERUPTION 10/07/2009  . SHOULDER PAIN, LEFT 12/27/2008  . Type I (juvenile type) diabetes mellitus without mention of complication, uncontrolled 12/03/2007  . HYPOTHYROIDISM 11/12/2007  . HYPERCHOLESTEROLEMIA 11/12/2007  . MULTIPLE SCLEROSIS 08/29/2007  . PROLIFERATIVE DIABETIC RETINOPATHY 08/29/2007  . HYPERTENSION 12/25/2006    Willow Ora, PTA, [facility] [address] [place], [place] [address] [phone] 11/25/19, 10:08 AM  Name: Johnathan Ross MRN: [id number] Date of Birth: [birth year]

## 2019-11-26 ENCOUNTER — Ambulatory Visit: Payer: Medicare Other | Admitting: Physical Therapy

## 2019-12-02 ENCOUNTER — Other Ambulatory Visit: Payer: Self-pay

## 2019-12-02 ENCOUNTER — Ambulatory Visit: Payer: Medicare Other | Attending: Physician Assistant | Admitting: Physical Therapy

## 2019-12-02 ENCOUNTER — Encounter: Payer: Self-pay | Admitting: Physical Therapy

## 2019-12-02 DIAGNOSIS — R296 Repeated falls: Secondary | ICD-10-CM | POA: Insufficient documentation

## 2019-12-02 DIAGNOSIS — R2681 Unsteadiness on feet: Secondary | ICD-10-CM | POA: Diagnosis not present

## 2019-12-02 DIAGNOSIS — R262 Difficulty in walking, not elsewhere classified: Secondary | ICD-10-CM

## 2019-12-02 DIAGNOSIS — M21371 Foot drop, right foot: Secondary | ICD-10-CM | POA: Diagnosis not present

## 2019-12-02 DIAGNOSIS — R2689 Other abnormalities of gait and mobility: Secondary | ICD-10-CM

## 2019-12-02 DIAGNOSIS — M6281 Muscle weakness (generalized): Secondary | ICD-10-CM | POA: Diagnosis not present

## 2019-12-02 DIAGNOSIS — M21372 Foot drop, left foot: Secondary | ICD-10-CM | POA: Insufficient documentation

## 2019-12-03 ENCOUNTER — Other Ambulatory Visit (INDEPENDENT_AMBULATORY_CARE_PROVIDER_SITE_OTHER): Payer: Medicare Other

## 2019-12-03 ENCOUNTER — Other Ambulatory Visit: Payer: Self-pay | Admitting: Endocrinology

## 2019-12-03 DIAGNOSIS — E1065 Type 1 diabetes mellitus with hyperglycemia: Secondary | ICD-10-CM

## 2019-12-03 DIAGNOSIS — E063 Autoimmune thyroiditis: Secondary | ICD-10-CM

## 2019-12-03 LAB — BASIC METABOLIC PANEL
BUN: 20 mg/dL (ref 6–23)
CO2: 28 mEq/L (ref 19–32)
Calcium: 9.4 mg/dL (ref 8.4–10.5)
Chloride: 98 mEq/L (ref 96–112)
Creatinine, Ser: 1.2 mg/dL (ref 0.40–1.50)
GFR: 78.42 mL/min (ref >60.00)
Glucose, Bld: 241 mg/dL — ABNORMAL HIGH (ref 70–99)
Potassium: 4.6 mEq/L (ref 3.5–5.1)
Sodium: 134 mEq/L — ABNORMAL LOW (ref 135–145)

## 2019-12-03 LAB — TSH: TSH: 1.41 u[IU]/mL (ref 0.35–4.50)

## 2019-12-03 LAB — T4, FREE: Free T4: 1.21 ng/dL (ref 0.60–1.60)

## 2019-12-03 LAB — HEMOGLOBIN A1C: Hgb A1c MFr Bld: 8 % — ABNORMAL HIGH (ref 4.6–6.5)

## 2019-12-03 NOTE — Therapy (Signed)
[facility] [address] [place], [place], [address] Phone: [phone]   Fax:  [phone]  Physical Therapy Treatment  Patient Details  Name: Johnathan Ross MRN: [id number] Date of Birth: Nov 27, 1970 Referring Provider (PT): Carylon Perches, MD   Encounter Date: 12/02/2019   PT End of Session - 12/02/19 0855    Visit Number 75    Number of Visits 66    Date for PT Re-Evaluation 01/02/20    Authorization Type Medicare Part A and B; 10th visit PN    Progress Note Due on Visit 52    PT Start Time 0846    PT Stop Time 0929    PT Time Calculation (min) 43 min    Equipment Utilized During Treatment Gait belt    Activity Tolerance Patient tolerated treatment well;Patient limited by fatigue;Other (comment)   limited by increased tone in left LE this session   Behavior During Therapy [facility] for tasks assessed/performed           Past Medical History:  Diagnosis Date  . Hyperlipidemia   . Hypertension   . Hypogonadism in male 11/01/2016  . Hypothyroidism   . Multiple sclerosis ([place])   . Proliferative diabetic retinopathy(362.02)   . Type 1 diabetes mellitus ([place]) dx'd 1994  . Vitamin D insufficiency 11/29/2016    Past Surgical History:  Procedure Laterality Date  . EYE SURGERY Bilateral    "laser for diabetic retinopathy"  . FRACTURE SURGERY    . OPEN REDUCTION INTERNAL FIXATION (ORIF) TIBIA/FIBULA FRACTURE Right 10/30/2016  . ORIF ANKLE FRACTURE Left 2015  . ORIF TIBIA FRACTURE Right 10/30/2016   Procedure: OPEN REDUCTION INTERNAL FIXATION (ORIF) TIBIA FIBULA FRACTURE;  Surgeon: Altamese [place], MD;  Location: [facility];  Service: Orthopedics;  Laterality: Right;  . RETINAL LASER PROCEDURE Bilateral    "for diabetic retinopathy"    There were no vitals filed for this visit.   Subjective Assessment - 12/02/19 0852    Subjective No new complaints. Has been drinking water more during the day with less LE cramping at  night. No falls or pain to report. No change in knee popping, mostly at night when trying to straighten knee out when cramping/tone kick in.    Patient is accompained by: Family member   mom in car   Pertinent History multiple falls, vitamin D insufficiency, Type 1 IDDM, diabetic retinopathy, hypothyroidism, HTN, hyperlipidemia, R tibia fracture, L ankle fracture with ORIF    Limitations Walking    Diagnostic tests No new lesions seen on MRI    Patient Stated Goals To improve walking    Currently in Pain? No/denies    Pain Score 0-No pain              [facility] PT Assessment - 12/02/19 0859      Standardized Balance Assessment   Standardized Balance Assessment 10 meter walk test;Berg Balance Test    10 Meter Walk 51.62 sec's with RW= 0.64 ft/sec. pt able to start test with soft knees for good gait pattern, after 2 stops pt ended test with swing through gait pattern using arms to lift LE's up/fwd.       Berg Balance Test   Sit to Stand Able to stand without using hands and stabilize independently   after 3 attempts   Standing Unsupported Able to stand safely 2 minutes    Sitting with Back Unsupported but Feet Supported on Floor or Stool Able to sit safely and securely 2  minutes    Stand to Sit Controls descent by using hands    Transfers Able to transfer safely, definite need of hands    Standing Unsupported with Eyes Closed Able to stand 10 seconds with supervision    Standing Unsupported with Feet Together Able to place feet together independently and stand for 1 minute with supervision   pt able to keep knees soft    From Standing, Reach Forward with Outstretched Arm Can reach confidently >25 cm (10")    From Standing Position, Pick up Object from Floor Able to pick up shoe safely and easily    From Standing Position, Turn to Look Behind Over each Shoulder Looks behind one side only/other side shows less weight shift   left > right   Turn 360 Degrees Needs assistance while turning     Standing Unsupported, Alternately Place Feet on Step/Stool Able to complete >2 steps/needs minimal assist   support on chair back needed   Standing Unsupported, One Foot in Front Able to plae foot ahead of the other independently and hold 30 seconds    Standing on One Leg Able to lift leg independently and hold equal to or more than 3 seconds    Total Score 41    Berg comment: 41/56= significant risk for falls                [facility] Adult PT Treatment/Exercise - 12/02/19 0859      Transfers   Transfers Sit to Stand;Stand to Sit    Stand to Sit 5: Supervision;With upper extremity assist;To bed;To chair/3-in-1      Ambulation/Gait   Ambulation/Gait Yes    Ambulation/Gait Assistance 5: Supervision    Ambulation/Gait Assistance Details around gym with session. multiple stops needed due to LE's locking out, cues to keep knees soft needed at times.     Assistive device Rolling walker    Gait Pattern Step-through pattern;Poor foot clearance - left;Left genu recurvatum;Decreased hip/knee flexion - right;Decreased hip/knee flexion - left;Decreased dorsiflexion - right;Poor foot clearance - right;Narrow base of support    Ambulation Surface Level;Indoor                PT Short Term Goals - 12/02/19 0856      PT SHORT TERM GOAL #1   Title Patient will demonstrate independence with updated HEP    Baseline 12/02/19: met with current program which pt reports is still challenging    Status Achieved    Target Date 12/03/19      PT SHORT TERM GOAL #2   Title Pt will improve gait velocity with RW to >/= 0.8 ft/sec    Baseline 12/02/19: 0.64 ft/sec with RW, improved just not to goal with pt ambulating "his way" with LE's locked out    Time --    Period --    Status Partially Met    Target Date 12/03/19      PT SHORT TERM GOAL #3   Title Pt will improve BERG to >/= 42/56    Baseline 12/02/19: 41/56 scored today, improved from 38/56, just not to goal    Time --    Period --    Status  Partially Met    Target Date 12/03/19      PT SHORT TERM GOAL #4   Title Pt will ambulate x 230' over flat, indoor surfaces with trekking poles with min A and intermittent standing rest breaks.    Baseline Pt currently ambulating with RW, trialed bilateral trekking  poles today and min assist +2 for 20'    Time 4    Period Weeks    Status On-going    Target Date 12/03/19      PT SHORT TERM GOAL #5   Title Pt will negotate 8 stairs with two rails step alternating sequence with supervision    Baseline 11/06/19 pt performed reciprocal pattern negotiating 4 steps supervisoin with bilateral rails    Time 4    Period Weeks    Status On-going    Target Date 12/03/19             PT Long Term Goals - 10/04/19 1811      PT LONG TERM GOAL #1   Title Pt will demonstrate independence with land HEP    Time 12    Period Weeks    Status Revised    Target Date 01/02/20      PT LONG TERM GOAL #2   Title Pt will improve gait velocity with cane to >/= 0.8 ft/sec    Baseline Not assessed with cane    Time 12    Period Weeks    Status Revised    Target Date 01/02/20      PT LONG TERM GOAL #3   Title Pt will improve BERG to >/= 46/56 to decrease falls risk    Time 12    Period Weeks    Status Revised    Target Date 01/02/20      PT LONG TERM GOAL #4   Title Pt will ambulate x 200' outside over pavement and grass with supervision and cane to improve safety ambulating to wood working shop at home    Time 12    Period Weeks    Status Revised    Target Date 01/02/20      PT LONG TERM GOAL #5   Title Pt will negotiate 8 stairs with one rail and cane MOD I for safe home entry/exit with cane    Time 12    Period Weeks    Status Revised    Target Date 01/02/20                 Plan - 12/02/19 0855    Clinical Impression Statement Today's skilled session began to address progress toward STGs. Pt's HEP goal was met today and his gait speed/Berg Balance test goals were partially  met. Will plan to check remaining STGs at next session.    Comorbidities multiple falls, vitamin D insufficiency, Type 1 IDDM, diabetic retinopathy, hypothyroidism, HTN, hyperlipidemia, R tibia fracture, L ankle fracture with ORIF    Rehab Potential Good    PT Frequency 2x / week    PT Duration 12 weeks    PT Treatment/Interventions ADLs/Self Care Home Management;Aquatic Therapy;Electrical Stimulation;DME Instruction;Gait training;Stair training;Functional mobility training;Therapeutic activities;Therapeutic exercise;Balance training;Neuromuscular re-education;Patient/family education;Orthotic Fit/Training;Passive range of motion;Energy conservation    PT Next Visit Plan Check remaining STGs; Continues standing activities with use of trekking poles to decrease UE support as well as gait when have 2cd person for safety, otherwise use single UE support on bar/counter. Continue activities to promote more left hip/knee flexion, continue to work on static standing balance with no UE support and hip/core strengthening in standing so not to aggrivate knee    Consulted and Agree with Plan of Care Patient           Patient will benefit from skilled therapeutic intervention in order to improve the following deficits and impairments:  Abnormal  gait, Decreased balance, Decreased endurance, Decreased coordination, Decreased range of motion, Decreased strength, Difficulty walking, Impaired tone, Postural dysfunction, Decreased activity tolerance, Decreased mobility  Visit Diagnosis: Muscle weakness (generalized)  Unsteadiness on feet  Difficulty in walking, not elsewhere classified  Other abnormalities of gait and mobility     Problem List Patient Active Problem List   Diagnosis Date Noted  . Gait disturbance 08/19/2019  . Acquired diplegia ([place]) 08/19/2019  . Vitamin D insufficiency 11/29/2016  . Hypogonadism in male 11/01/2016  . 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[place])   .  Hypothyroidism   . Hypertension   . Hyperlipidemia   . Closed fracture of right fibula and tibia 10/29/2016  . Closed tibia fracture 10/29/2016  . Closed fracture of right tibial plafond with fibula involvement 10/29/2016  . Abnormal liver function tests 03/06/2013  . RASH AND OTHER NONSPECIFIC SKIN ERUPTION 10/07/2009  . SHOULDER PAIN, LEFT 12/27/2008  . Type I (juvenile type) diabetes mellitus without mention of complication, uncontrolled 12/03/2007  . HYPOTHYROIDISM 11/12/2007  . HYPERCHOLESTEROLEMIA 11/12/2007  . MULTIPLE SCLEROSIS 08/29/2007  . PROLIFERATIVE DIABETIC RETINOPATHY 08/29/2007  . HYPERTENSION 12/25/2006    Willow Ora, PTA, [facility] [address], [place], [place] [address] [phone] 12/03/19, 10:26 AM   Name: CARLTON BUSKEY MRN: [id number] Date of Birth: 16-Feb-1971

## 2019-12-04 ENCOUNTER — Other Ambulatory Visit: Payer: Self-pay

## 2019-12-04 ENCOUNTER — Ambulatory Visit: Payer: Medicare Other | Admitting: Physical Therapy

## 2019-12-04 ENCOUNTER — Encounter: Payer: Self-pay | Admitting: Physical Therapy

## 2019-12-04 DIAGNOSIS — R2681 Unsteadiness on feet: Secondary | ICD-10-CM

## 2019-12-04 DIAGNOSIS — M21371 Foot drop, right foot: Secondary | ICD-10-CM | POA: Diagnosis not present

## 2019-12-04 DIAGNOSIS — M6281 Muscle weakness (generalized): Secondary | ICD-10-CM | POA: Diagnosis not present

## 2019-12-04 DIAGNOSIS — R2689 Other abnormalities of gait and mobility: Secondary | ICD-10-CM | POA: Diagnosis not present

## 2019-12-04 DIAGNOSIS — M21372 Foot drop, left foot: Secondary | ICD-10-CM | POA: Diagnosis not present

## 2019-12-04 DIAGNOSIS — R262 Difficulty in walking, not elsewhere classified: Secondary | ICD-10-CM

## 2019-12-04 LAB — FRUCTOSAMINE: Fructosamine: 383 umol/L — ABNORMAL HIGH (ref 0–285)

## 2019-12-04 NOTE — Therapy (Signed)
[facility] [address] [place], [place], [address] Phone: [phone]   Fax:  [phone]  Physical Therapy Treatment  Patient Details  Name: Johnathan Ross MRN: [id number] Date of Birth: 1970/11/05 Referring Provider (PT): Carylon Perches, MD   Encounter Date: 12/04/2019   PT End of Session - 12/04/19 0852    Visit Number 40   number adjusted today due to misnumber on viist 6/29   Number of Visits 92    Date for PT Re-Evaluation 01/02/20    Authorization Type Medicare Part A and B; 10th visit PN    Progress Note Due on Visit 79    PT Start Time 0847    PT Stop Time 0930    PT Time Calculation (min) 43 min    Equipment Utilized During Treatment Gait belt    Activity Tolerance Patient tolerated treatment well;Patient limited by fatigue;Other (comment)   limited by increased tone in left LE this session   Behavior During Therapy [facility] for tasks assessed/performed           Past Medical History:  Diagnosis Date  . Hyperlipidemia   . Hypertension   . Hypogonadism in male 11/01/2016  . Hypothyroidism   . Multiple sclerosis ([place])   . Proliferative diabetic retinopathy(362.02)   . Type 1 diabetes mellitus ([place]) dx'd 1994  . Vitamin D insufficiency 11/29/2016    Past Surgical History:  Procedure Laterality Date  . EYE SURGERY Bilateral    "laser for diabetic retinopathy"  . FRACTURE SURGERY    . OPEN REDUCTION INTERNAL FIXATION (ORIF) TIBIA/FIBULA FRACTURE Right 10/30/2016  . ORIF ANKLE FRACTURE Left 2015  . ORIF TIBIA FRACTURE Right 10/30/2016   Procedure: OPEN REDUCTION INTERNAL FIXATION (ORIF) TIBIA FIBULA FRACTURE;  Surgeon: [place]amese [name], MD;  Location: [facility];  Service: Orthopedics;  Laterality: Right;  . RETINAL LASER PROCEDURE Bilateral    "for diabetic retinopathy"    There were no vitals filed for this visit.   Subjective Assessment - 12/04/19 0852    Subjective No new complaints. No falls or pain  to report.    Patient is accompained by: Family member   mom in car   Pertinent History multiple falls, vitamin D insufficiency, Type 1 IDDM, diabetic retinopathy, hypothyroidism, HTN, hyperlipidemia, R tibia fracture, L ankle fracture with ORIF    Limitations Walking    Diagnostic tests No new lesions seen on MRI    Patient Stated Goals To improve walking    Currently in Pain? No/denies    Pain Score 0-No pain                [facility] Adult PT Treatment/Exercise - 12/04/19 0854      Transfers   Transfers Sit to Stand;Stand to Sit    Sit to Stand 5: Supervision;With upper extremity assist;From bed;From chair/3-in-1    Stand to Sit 5: Supervision;With upper extremity assist;To bed;To chair/3-in-1      Ambulation/Gait   Ambulation/Gait Yes    Ambulation/Gait Assistance 5: Supervision;4: Min assist;3: Mod assist   sup with RW; min/mod assist of 2 with trekking poles   Ambulation/Gait Assistance Details 2 person min/mod assist with trekking poles with cues on posture, base of support, pole position and weight shifting. several stops needed due to extensor tone kicking in to get soft knees. increased difficulty advancing left LE due to foot catching when tone kicked in. needed RW assist one time to reposition bil feet, then switched back to trekking poles.  Ambulation Distance (Feet) 40 Feet   x1, 20 x1   Assistive device Rolling walker;Other (Comment)   trekking poles   Gait Pattern Step-through pattern;Poor foot clearance - left;Left genu recurvatum;Decreased hip/knee flexion - right;Decreased hip/knee flexion - left;Decreased dorsiflexion - right;Poor foot clearance - right;Narrow base of support    Ambulation Surface Level;Indoor    Stairs Yes    Stairs Assistance 5: Supervision    Stairs Assistance Details (indicate cue type and reason) mostly alternating pattern with 2 episodes of bil feet on same step with ascending due to knee locking out and pt needing to set foot down for  balance. time needed for pt to get knees soft/unlocked with both ascending and descending stairs.     Stair Management Technique Two rails;Alternating pattern;Step to pattern;Forwards    Number of Stairs 4   x2   Height of Stairs 6      Neuro Re-ed    Neuro Re-ed Details  for strengthening/muscle re-ed: in parallel bars- side stepping in squat position, then fwd/bwd diagonal stepping while in squat position, 3 laps each/each way with cues on form and technique.                PT Short Term Goals - 12/04/19 1433      PT SHORT TERM GOAL #1   Title Patient will demonstrate independence with updated HEP    Baseline 12/02/19: met with current program which pt reports is still challenging    Status Achieved    Target Date 12/03/19      PT SHORT TERM GOAL #2   Title Pt will improve gait velocity with RW to >/= 0.8 ft/sec    Baseline 12/02/19: 0.64 ft/sec with RW, improved just not to goal with pt ambulating "his way" with LE's locked out    Status Partially Met    Target Date 12/03/19      PT SHORT TERM GOAL #3   Title Pt will improve BERG to >/= 42/56    Baseline 12/02/19: 41/56 scored today, improved from 38/56, just not to goal    Status Partially Met    Target Date 12/03/19      PT SHORT TERM GOAL #4   Title Pt will ambulate x 230' over flat, indoor surfaces with trekking poles with min A and intermittent standing rest breaks.    Baseline 12/04/19: min assist of 2 for up to 40 feet today. Has gone as far as 100 feet with 2 person assist in prior session.    Time --    Period --    Status Partially Met    Target Date 12/03/19      PT SHORT TERM GOAL #5   Title Pt will negotate 8 stairs with two rails step alternating sequence with supervision    Baseline 12/04/19: met in session today    Time --    Period --    Status Achieved    Target Date 12/03/19             PT Long Term Goals - 10/04/19 1811      PT LONG TERM GOAL #1   Title Pt will demonstrate independence with land  HEP    Time 12    Period Weeks    Status Revised    Target Date 01/02/20      PT LONG TERM GOAL #2   Title Pt will improve gait velocity with cane to >/= 0.8 ft/sec    Baseline Not assessed with  cane    Time 12    Period Weeks    Status Revised    Target Date 01/02/20      PT LONG TERM GOAL #3   Title Pt will improve BERG to >/= 46/56 to decrease falls risk    Time 12    Period Weeks    Status Revised    Target Date 01/02/20      PT LONG TERM GOAL #4   Title Pt will ambulate x 200' outside over pavement and grass with supervision and cane to improve safety ambulating to wood working shop at home    Time 12    Period Weeks    Status Revised    Target Date 01/02/20      PT LONG TERM GOAL #5   Title Pt will negotiate 8 stairs with one rail and cane MOD I for safe home entry/exit with cane    Time 12    Period Weeks    Status Revised    Target Date 01/02/20                 Plan - 12/04/19 0853    Clinical Impression Statement Today's skilled session focused on progress toward remaining STGs with stair goal met and gait goal partially met. Pt continues to have increased spasms with gait causing LE's to lock out. Discussed with primary PT after session, will discuss use of an orthotic on left LE and possibly a new one on right LE with pt at next session. Also will try forearm crutches at next session to see if they provide any more support with gait than trekking poles while still decreasing pt's UE reliance with gait. Continued to work on dynamic balance activities today with emphasis on keeping knees soft. The pt is slowly progressing toward goals and should benefit from continued PT to progress toward unmet goals.    Comorbidities multiple falls, vitamin D insufficiency, Type 1 IDDM, diabetic retinopathy, hypothyroidism, HTN, hyperlipidemia, R tibia fracture, L ankle fracture with ORIF    Rehab Potential Good    PT Frequency 2x / week    PT Duration 12 weeks    PT  Treatment/Interventions ADLs/Self Care Home Management;Aquatic Therapy;Electrical Stimulation;DME Instruction;Gait training;Stair training;Functional mobility training;Therapeutic activities;Therapeutic exercise;Balance training;Neuromuscular re-education;Patient/family education;Orthotic Fit/Training;Passive range of motion;Energy conservation    PT Next Visit Plan ? try an AFO on left LE for spasticity control; try forarm crutches with gait for decreased UE support with gait, otherwise use single UE support on bar/counter. Continue activities to promote more left hip/knee flexion, continue to work on static standing balance with no UE support and hip/core strengthening in standing so not to aggrivate knee    Consulted and Agree with Plan of Care Patient           Patient will benefit from skilled therapeutic intervention in order to improve the following deficits and impairments:  Abnormal gait, Decreased balance, Decreased endurance, Decreased coordination, Decreased range of motion, Decreased strength, Difficulty walking, Impaired tone, Postural dysfunction, Decreased activity tolerance, Decreased mobility  Visit Diagnosis: Muscle weakness (generalized)  Unsteadiness on feet  Difficulty in walking, not elsewhere classified  Other abnormalities of gait and mobility  Foot drop, right  Foot drop, left     Problem List Patient Active Problem List   Diagnosis Date Noted  . Gait disturbance 08/19/2019  . Acquired diplegia ([place]) 08/19/2019  . Vitamin D insufficiency 11/29/2016  . Hypogonadism in male 11/01/2016  . Type 1 diabetes mellitus with  diabetic polyneuropathy ([place])   . Hypothyroidism   . Hypertension   . Hyperlipidemia   . Closed fracture of right fibula and tibia 10/29/2016  . Closed tibia fracture 10/29/2016  . Closed fracture of right tibial plafond with fibula involvement 10/29/2016  . Abnormal liver function tests 03/06/2013  . RASH AND OTHER NONSPECIFIC SKIN  ERUPTION 10/07/2009  . SHOULDER PAIN, LEFT 12/27/2008  . Type I (juvenile type) diabetes mellitus without mention of complication, uncontrolled 12/03/2007  . HYPOTHYROIDISM 11/12/2007  . HYPERCHOLESTEROLEMIA 11/12/2007  . MULTIPLE SCLEROSIS 08/29/2007  . PROLIFERATIVE DIABETIC RETINOPATHY 08/29/2007  . HYPERTENSION 12/25/2006    Willow Ora, PTA, [facility] [address], [place], [place] [address] [phone] 12/04/19, 2:41 PM   Name: Johnathan Ross MRN: [id number] Date of Birth: 07-30-1970

## 2019-12-08 ENCOUNTER — Telehealth (INDEPENDENT_AMBULATORY_CARE_PROVIDER_SITE_OTHER): Payer: Medicare Other | Admitting: Endocrinology

## 2019-12-08 ENCOUNTER — Other Ambulatory Visit: Payer: Self-pay

## 2019-12-08 ENCOUNTER — Ambulatory Visit: Payer: Medicare Other | Admitting: Physical Therapy

## 2019-12-08 ENCOUNTER — Encounter: Payer: Self-pay | Admitting: Physical Therapy

## 2019-12-08 ENCOUNTER — Telehealth: Payer: Self-pay | Admitting: Physician Assistant

## 2019-12-08 DIAGNOSIS — E1065 Type 1 diabetes mellitus with hyperglycemia: Secondary | ICD-10-CM

## 2019-12-08 DIAGNOSIS — M6281 Muscle weakness (generalized): Secondary | ICD-10-CM

## 2019-12-08 DIAGNOSIS — M21371 Foot drop, right foot: Secondary | ICD-10-CM | POA: Diagnosis not present

## 2019-12-08 DIAGNOSIS — E063 Autoimmune thyroiditis: Secondary | ICD-10-CM | POA: Diagnosis not present

## 2019-12-08 DIAGNOSIS — R2689 Other abnormalities of gait and mobility: Secondary | ICD-10-CM

## 2019-12-08 DIAGNOSIS — R262 Difficulty in walking, not elsewhere classified: Secondary | ICD-10-CM | POA: Diagnosis not present

## 2019-12-08 DIAGNOSIS — M21372 Foot drop, left foot: Secondary | ICD-10-CM | POA: Diagnosis not present

## 2019-12-08 DIAGNOSIS — E782 Mixed hyperlipidemia: Secondary | ICD-10-CM

## 2019-12-08 DIAGNOSIS — R2681 Unsteadiness on feet: Secondary | ICD-10-CM | POA: Diagnosis not present

## 2019-12-08 NOTE — Telephone Encounter (Signed)
Thank you, please try both his phone numbers. Let him know that I have highly recommended the vaccine and there are no contraindications

## 2019-12-08 NOTE — Telephone Encounter (Signed)
Called to discuss the homebound Covid-19 vaccination initiative with the patient and/or caregiver.   Message left to call back.  Odas Ozer PA-C  [facility]     

## 2019-12-08 NOTE — Progress Notes (Signed)
Subjective:              Patient ID: Johnathan Ross, male   DOB: 1971/04/11, 49 y.o.   MRN: [id number]  I connected with the above-named patient by video enabled telemedicine application and verified that I am speaking with the correct person. The patient was explained the limitations of evaluation and management by telemedicine and the availability of in person appointments.  Patient also understood that there may be a patient responsible charge related to this service . Location of the patient: Patient's home . Location of the provider: Physician office Only the patient and myself were participating in the encounter The patient understood the above statements and agreed to proceed.    Diabetes   Diagnosis: Type 1 diabetes mellitus, date of diagnosis:  1995.   Insulin Pump followup:   CURRENT brand:  Medtronic  630 G   HISTORY: An insulin pump has been in use since 03/13/11.   PUMP settings are basal rate:  12 AM-2 AM = 0.55, 2 AM-6 AM = 0.75,, 6 a.m.= 2.05, 7 AM = 1.90,. 9 AM = 1. 85, 6 PM = 1.7, 9 PM = 0.5, 10 PM = 1.7  Carbohydrate to insulin ratio: 8 AM = 1: 8, at 11 a.m. = 1: 5, after 6 p.m. = 1:7, hyperglycemia correction factor: 1: 35,and after 10 p.m. 1:30, target 120, active insulin time 4 hours.   Recent history:  His A1c has usually been over 8% and now 8%   Current blood sugar patterns Evaluated from freestyle Libre and pump download, management and problems identified:  Current management, blood sugar patterns and problems identified     He still has variable blood sugar patterns as discussed in the CGM interpretation  His blood sugars are on an average high throughout the day with somewhat lower readings on an average between 2-4 AM and 2-4 PM  As before he is suspending his pump sometimes for several hours when his blood sugars are getting low especially overnight  However did not suspend his pump for be allergic to low sugars on the night of 7/7  even though he thinks he is able to wake up with low blood sugars  He thinks his freestyle Elenor Legato is fairly accurate  Still waiting on the pricing for his supplies for the T-insulin pump  Because of fear of hypoglycemia he is usually not bolusing enough and frequently will not bolus at breakfast even though he is eating a biscuit usually at 10-11 AM    CONTINUOUS GLUCOSE MONITORING RECORD INTERPRETATION    Dates of Recording: 6/25 to 12/03/2019  Sensor description: [employer]  Results statistics:   CGM use % of time  95  Average and SD  177+/-  Time in range    49    %  % Time Above 180  32  % Time above 250  14  % Time Below target  5    PRE-MEAL Fasting Lunch Dinner Bedtime Overall  Glucose range:       Mean/median:  165  149 163  198  177   POST-MEAL PC Breakfast PC Lunch PC Dinner  Glucose range:     Mean/median:  224    208    Glycemic patterns summary: Overall blood sugars are averaging in the upper 100s with some mealtime spikes after breakfast and dinnertime Has sporadic low blood sugar episodes late at night and once mid afternoon and once around 6: 30-8:30 PM  Hyperglycemic episodes are  occurring sporadically at all times especially around 10-11 AM and after 6 PM but not consistently daily Occasionally has high readings overnight also  Hypoglycemic episodes occurred sporadically overnight and once around 8 PM.  Hypoglycemia was prolonged on the night of 7/7  Overnight periods: Overall blood sugars are fluctuating and not consistent with low sugars twice and mostly averaging between 150-170  Preprandial periods: Blood sugars are mostly high at breakfast, lunch meals and not always identified and average blood sugars higher at dinnertime but not consistently  Postprandial periods:   After breakfast: Has some significant spikes in blood sugars occasionally likely from missed boluses After lunch: Not consistently high when he is bolusing After dinner: Blood sugar  patterns are variable and had significantly high readings at least 3 days last month and twice this month   Previous data:   CGM use % of time  97  Average and SD  152, GV 44  Time in range     65   %  % Time Above 180  19  % Time above 250 9  % Time Below target  7    PRE-MEAL Fasting Lunch Dinner  2-4 AM Overall  Glucose range:       Mean/median:  142  132  167  112    POST-MEAL PC Breakfast PC Lunch PC Dinner  Glucose range:     Mean/median:  179  158  183    Glycemic patterns summary: Blood sugars are generally fluctuating significantly except middle of the night Overall highest blood sugars are midmorning and frequently in the evenings Occasionally has tendency to low sugars between 2-6 AM    Wt Readings from Last 3 Encounters:  11/16/19 197 lb (89.4 kg)  08/19/19 197 lb 6.4 oz (89.5 kg)  05/21/19 187 lb (84.8 kg)     Lab Results  Component Value Date   HGBA1C 8.0 (H) 12/03/2019   HGBA1C 7.5 (H) 08/11/2019   HGBA1C 7.6 (H) 05/14/2019   Lab Results  Component Value Date   MICROALBUR 1.1 12/23/2017   LDLCALC 81 08/11/2019   CREATININE 1.20 12/03/2019     OTHER active problems discussed today: See review of systems    Allergies as of 12/08/2019   No Known Allergies     Medication List       Accurate as of December 08, 2019  1:17 PM. If you have any questions, ask your nurse or doctor.        STOP taking these medications   insulin lispro 100 UNIT/ML injection Commonly known as: HUMALOG Stopped by: Elayne Snare, MD     TAKE these medications   Ampyra 10 MG Tb12 Generic drug: dalfampridine Take 10 mg by mouth See admin instructions. Take 10 mg by mouth in the morning and 10 mg mid-afternoon   baclofen 10 MG tablet Commonly known as: LIORESAL Take one half to one pill three times a day.   Contour Next Test test strip Generic drug: glucose blood USE AS INSTRUCTED TO CHECK BLOOD SUGARS 8 TIMES DAILY   insulin aspart 100 UNIT/ML  injection Commonly known as: NovoLOG USE UP TO 80 UNITS IN INSULIN PUMP DAILY-DX CODE E10.8   lisinopril 10 MG tablet Commonly known as: ZESTRIL Take 1 tablet (10 mg total) by mouth daily.   simvastatin 20 MG tablet Commonly known as: ZOCOR Take 1 tablet (20 mg total) by mouth at bedtime.   Synthroid 125 MCG tablet Generic drug: levothyroxine Take 1 tablet by mouth 6  days per week.       Allergies: No Known Allergies  Past Medical History:  Diagnosis Date  . Hyperlipidemia   . Hypertension   . Hypogonadism in male 11/01/2016  . Hypothyroidism   . Multiple sclerosis ([place])   . Proliferative diabetic retinopathy(362.02)   . Type 1 diabetes mellitus ([place]) dx'd 1994  . Vitamin D insufficiency 11/29/2016    Past Surgical History:  Procedure Laterality Date  . EYE SURGERY Bilateral    "laser for diabetic retinopathy"  . FRACTURE SURGERY    . OPEN REDUCTION INTERNAL FIXATION (ORIF) TIBIA/FIBULA FRACTURE Right 10/30/2016  . ORIF ANKLE FRACTURE Left 2015  . ORIF TIBIA FRACTURE Right 10/30/2016   Procedure: OPEN REDUCTION INTERNAL FIXATION (ORIF) TIBIA FIBULA FRACTURE;  Surgeon: Altamese [place], MD;  Location: [facility];  Service: Orthopedics;  Laterality: Right;  . RETINAL LASER PROCEDURE Bilateral    "for diabetic retinopathy"    Family History  Problem Relation Age of Onset  . Diabetes Sister   . Colon cancer Maternal Grandmother   . Colon polyps Maternal Uncle     Social History:  reports that he has never smoked. He has never used smokeless tobacco. He reports that he does not drink alcohol and does not use drugs.    Review of Systems      HYPERCHOLESTEROLEMIA: He has been taking simvastatin 20 mg daily  LDL is below 100 consistently  Has no increase in ALT  Lab Results  Component Value Date   CHOL 134 08/11/2019   HDL 46.00 08/11/2019   LDLCALC 81 08/11/2019   LDLDIRECT 139.7 04/09/2014   TRIG 38.0 08/11/2019   CHOLHDL 3 08/11/2019   Lab Results  Component  Value Date   ALT 23 08/11/2019    HYPERTENSION:  has  been on lisinopril with good control  of blood pressure    BP Readings from Last 3 Encounters:  11/16/19 112/82  08/19/19 130/78  05/21/19 124/72    He has MULTIPLE sclerosis, is followed in [place] by neurologist and still not on treatment  HYPOTHYROIDISM: He has had longstanding primary 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 autoimmune  His thyroid dosage has changed frequently and has had to change his regimen with every visit  His TSH has been frequently variable and not clear why it fluctuates He also does not feel any different whether his thyroid level is high or low More recently has required lower doses   His dose has been reduced to 125 mcg, 6 days/week because of persistently low TSH level He is able to get brand-name Synthroid and is using 90-day prescriptions  He is regular with taking his supplement before breakfast daily TSH is now normal again compared to 3/21   Lab Results  Component Value Date   TSH 1.41 12/03/2019   TSH 0.68 08/19/2019   TSH 0.14 (L) 08/11/2019   FREET4 1.21 12/03/2019   FREET4 1.33 08/11/2019   FREET4 1.41 05/14/2019   He has not been able to sign up for the Covid vaccine     Objective:   Physical Exam  There were no vitals taken for this visit.           Assessment:      DIABETES type I on insulin pump:   See history of present illness for detailed discussion of current diabetes management, blood sugar patterns and problems identified  He has a high A1c, now 8%  Blood sugars are only within target range 49% of the time Hyperglycemia is mildly related  to postprandial rise in blood sugar after breakfast or dinner or refined unpredictably higher, also has some rebound from low normal or low sugars as well as from suspending his pump excessively as before   HYPOTHYROIDISM:  TSH is back to normal consistently with 125 mcg 6 days a week and will continue this   Lipids: Has  been well controlled with simvastatin 20 mg and will recheck in the next visit  HYPERTENSION: To follow-up with PCP     Plan:       If he is able to get the T-insulin pump he will proceed with this otherwise start using the freestyle libre version 2 and he can get a sample of the new meter from [place] if needed He needs to bolus for all meals and large snacks especially breakfast regardless of blood sugar level He will need lower basal rate early part of the night and higher basal rates before breakfast and after dinner as well as higher carbohydrate coverage for dinner Needs to send in his urine microalbumin sample  My chart message sent as follows  Basal changes:  12 AM-2 AM = 0.45.  2 AM-4 AM = 0.65.  4 AM-6 AM = 1.15. 7 AM-9 AM = 2.05.  6 PM-10 PM = 1.80.  10 PM-12 AM = 1.60 BOLUS changes: Carbohydrate ratio at 6 PM = 1: 6 and 10 PM = 1: 10 (new time) Sensitivity at 10 PM-12 AM = 45 (new) Please make sure you bolus for all carbohydrate intake especially breakfast  Follow-up 3 months   There are no Patient Instructions on file for this visit.     Elayne Snare  Note: This office note was prepared with Estate agent. Any transcriptional errors that result from this process are unintentional.

## 2019-12-09 ENCOUNTER — Other Ambulatory Visit: Payer: Self-pay | Admitting: Physician Assistant

## 2019-12-09 NOTE — Therapy (Signed)
[facility] [address] [place], [place], [address] Phone: [phone]   Fax:  [phone]  Physical Therapy Treatment  Patient Details  Name: Johnathan Ross MRN: [id number] Date of Birth: 06-Apr-1971 Referring Provider (PT): Carylon Perches, MD   Encounter Date: 12/08/2019   PT End of Session - 12/08/19 0850    Visit Number 50    Number of Visits 62    Date for PT Re-Evaluation 01/02/20    Authorization Type Medicare Part A and B; 10th visit PN    Progress Note Due on Visit 74    PT Start Time 0846    PT Stop Time 0930    PT Time Calculation (min) 44 min    Equipment Utilized During Treatment Gait belt    Activity Tolerance Patient tolerated treatment well;Patient limited by fatigue;Other (comment)   limited by increased tone in left LE this session   Behavior During Therapy [facility] for tasks assessed/performed           Past Medical History:  Diagnosis Date  . Hyperlipidemia   . Hypertension   . Hypogonadism in male 11/01/2016  . Hypothyroidism   . Multiple sclerosis ([place])   . Proliferative diabetic retinopathy(362.02)   . Type 1 diabetes mellitus ([place]) dx'd 1994  . Vitamin D insufficiency 11/29/2016    Past Surgical History:  Procedure Laterality Date  . EYE SURGERY Bilateral    "laser for diabetic retinopathy"  . FRACTURE SURGERY    . OPEN REDUCTION INTERNAL FIXATION (ORIF) TIBIA/FIBULA FRACTURE Right 10/30/2016  . ORIF ANKLE FRACTURE Left 2015  . ORIF TIBIA FRACTURE Right 10/30/2016   Procedure: OPEN REDUCTION INTERNAL FIXATION (ORIF) TIBIA FIBULA FRACTURE;  Surgeon: Altamese Coleridge, MD;  Location: [facility];  Service: Orthopedics;  Laterality: Right;  . RETINAL LASER PROCEDURE Bilateral    "for diabetic retinopathy"    There were no vitals filed for this visit.   Subjective Assessment - 12/08/19 0850    Subjective No new complaints. No falls or pain to report.    Pertinent History multiple falls,  vitamin D insufficiency, Type 1 IDDM, diabetic retinopathy, hypothyroidism, HTN, hyperlipidemia, R tibia fracture, L ankle fracture with ORIF    Limitations Walking    Diagnostic tests No new lesions seen on MRI    Currently in Pain? No/denies    Pain Score 0-No pain                [facility] Adult PT Treatment/Exercise - 12/08/19 0852      Transfers   Transfers Sit to Stand;Stand to Sit    Sit to Stand 5: Supervision;With upper extremity assist;From bed;From chair/3-in-1    Stand to Sit 5: Supervision;With upper extremity assist;To bed;To chair/3-in-1      Ambulation/Gait   Ambulation/Gait Yes    Ambulation/Gait Assistance 4: Min guard;4: Min assist    Ambulation/Gait Assistance Details 2 person min guard to min assist with use of forearm crutches. no locking out of left LE with 1st lap, 2 episodes of needing to rest with second lap. Cues needed on sequening and crutch placement. Pt demo'd improved upright posture with use of crutches vs RW.      Ambulation Distance (Feet) 115 Feet   x2 with crutches   Assistive device Rolling walker    Gait Pattern Step-through pattern;Poor foot clearance - left;Left genu recurvatum;Decreased hip/knee flexion - right;Decreased hip/knee flexion - left;Decreased dorsiflexion - right;Poor foot clearance - right;Narrow base of support    Ambulation  Surface Level;Indoor      Self-Care   Self-Care Other Self-Care Comments    Other Self-Care Comments  discussed possible use of an AFO on left LE to assist with decreased tone/spasticity. Pt not open to this option at this time.       Knee/Hip Exercises: Stretches   Proofreader Limitations prone passive quad stretching with overpressure at pelvis as well for hip flexor stretching      Knee/Hip Exercises: Prone   Hamstring Curl 5 reps;Limitations;3 sets    Hamstring Curl Limitations with yellow band resistance, cues to break tone, then pt able to perform actively                      PT Short Term Goals - 12/04/19 1433      PT SHORT TERM GOAL #1   Title Patient will demonstrate independence with updated HEP    Baseline 12/02/19: met with current program which pt reports is still challenging    Status Achieved    Target Date 12/03/19      PT SHORT TERM GOAL #2   Title Pt will improve gait velocity with RW to >/= 0.8 ft/sec    Baseline 12/02/19: 0.64 ft/sec with RW, improved just not to goal with pt ambulating "his way" with LE's locked out    Status Partially Met    Target Date 12/03/19      PT SHORT TERM GOAL #3   Title Pt will improve BERG to >/= 42/56    Baseline 12/02/19: 41/56 scored today, improved from 38/56, just not to goal    Status Partially Met    Target Date 12/03/19      PT SHORT TERM GOAL #4   Title Pt will ambulate x 230' over flat, indoor surfaces with trekking poles with min A and intermittent standing rest breaks.    Baseline 12/04/19: min assist of 2 for up to 40 feet today. Has gone as far as 100 feet with 2 person assist in prior session.    Time --    Period --    Status Partially Met    Target Date 12/03/19      PT SHORT TERM GOAL #5   Title Pt will negotate 8 stairs with two rails step alternating sequence with supervision    Baseline 12/04/19: met in session today    Time --    Period --    Status Achieved    Target Date 12/03/19             PT Long Term Goals - 10/04/19 1811      PT LONG TERM GOAL #1   Title Pt will demonstrate independence with land HEP    Time 12    Period Weeks    Status Revised    Target Date 01/02/20      PT LONG TERM GOAL #2   Title Pt will improve gait velocity with cane to >/= 0.8 ft/sec    Baseline Not assessed with cane    Time 12    Period Weeks    Status Revised    Target Date 01/02/20      PT LONG TERM GOAL #3   Title Pt will improve BERG to >/= 46/56 to decrease falls risk    Time 12    Period Weeks    Status Revised    Target Date 01/02/20      PT LONG TERM GOAL  #  4   Title Pt will ambulate x 200' outside over pavement and grass with supervision and cane to improve safety ambulating to wood working shop at home    Time 12    Period Weeks    Status Revised    Target Date 01/02/20      PT LONG TERM GOAL #5   Title Pt will negotiate 8 stairs with one rail and cane MOD I for safe home entry/exit with cane    Time 12    Period Weeks    Status Revised    Target Date 01/02/20                 Plan - 12/08/19 0851    Clinical Impression Statement Today's skilled session continued to focus on LE stretching and strenghtening for decreased spasticity and tone. Initiated gait with forearm crutches today with improved posture and decreased LE "locking out" noted. Will continue to work with crutches with gait. The pt is progressing and should benefit from continued PT to progress toward unmet goals.    Comorbidities multiple falls, vitamin D insufficiency, Type 1 IDDM, diabetic retinopathy, hypothyroidism, HTN, hyperlipidemia, R tibia fracture, L ankle fracture with ORIF    Rehab Potential Good    PT Frequency 2x / week    PT Duration 12 weeks    PT Treatment/Interventions ADLs/Self Care Home Management;Aquatic Therapy;Electrical Stimulation;DME Instruction;Gait training;Stair training;Functional mobility training;Therapeutic activities;Therapeutic exercise;Balance training;Neuromuscular re-education;Patient/family education;Orthotic Fit/Training;Passive range of motion;Energy conservation    PT Next Visit Plan continue with forearm crutches with gait for decreased UE support with gait, otherwise use single UE support on bar/counter. Continue activities to promote more left hip/knee flexion, continue to work on static standing balance with no UE support and hip/core strengthening in standing so not to aggrivate knee    Consulted and Agree with Plan of Care Patient           Patient will benefit from skilled therapeutic intervention in order to improve  the following deficits and impairments:  Abnormal gait, Decreased balance, Decreased endurance, Decreased coordination, Decreased range of motion, Decreased strength, Difficulty walking, Impaired tone, Postural dysfunction, Decreased activity tolerance, Decreased mobility  Visit Diagnosis: Muscle weakness (generalized)  Unsteadiness on feet  Difficulty in walking, not elsewhere classified  Other abnormalities of gait and mobility     Problem List Patient Active Problem List   Diagnosis Date Noted  . Gait disturbance 08/19/2019  . Acquired diplegia ([place]) 08/19/2019  . Vitamin D insufficiency 11/29/2016  . Hypogonadism in male 11/01/2016  . 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 Hypothyroidism   . Hypertension   . Hyperlipidemia   . Closed fracture of right fibula and tibia 10/29/2016  . Closed tibia fracture 10/29/2016  . Closed fracture of right tibial plafond with fibula involvement 10/29/2016  . Abnormal liver function tests 03/06/2013  . RASH AND OTHER NONSPECIFIC SKIN ERUPTION 10/07/2009  . SHOULDER PAIN, LEFT 12/27/2008  . Type I (juvenile type) diabetes mellitus without mention of complication, uncontrolled 12/03/2007  . HYPOTHYROIDISM 11/12/2007  . HYPERCHOLESTEROLEMIA 11/12/2007  . MULTIPLE SCLEROSIS 08/29/2007  . PROLIFERATIVE DIABETIC RETINOPATHY 08/29/2007  . HYPERTENSION 12/25/2006    Willow Ora, PTA, [facility] [address] [place], [place] [address] [phone] 12/09/19, 3:27 PM   Name: Johnathan Ross MRN: [id number] Date of Birth: [birth year]

## 2019-12-09 NOTE — Telephone Encounter (Signed)
Tried both numbers again today. One has a full VM and left another message on the other line. Will try again tomorrow.

## 2019-12-11 ENCOUNTER — Encounter: Payer: Self-pay | Admitting: Physical Therapy

## 2019-12-11 ENCOUNTER — Ambulatory Visit: Payer: Medicare Other | Admitting: Physical Therapy

## 2019-12-11 ENCOUNTER — Other Ambulatory Visit: Payer: Self-pay

## 2019-12-11 DIAGNOSIS — R262 Difficulty in walking, not elsewhere classified: Secondary | ICD-10-CM

## 2019-12-11 DIAGNOSIS — M6281 Muscle weakness (generalized): Secondary | ICD-10-CM | POA: Diagnosis not present

## 2019-12-11 DIAGNOSIS — R2681 Unsteadiness on feet: Secondary | ICD-10-CM

## 2019-12-11 DIAGNOSIS — M21371 Foot drop, right foot: Secondary | ICD-10-CM | POA: Diagnosis not present

## 2019-12-11 DIAGNOSIS — R2689 Other abnormalities of gait and mobility: Secondary | ICD-10-CM | POA: Diagnosis not present

## 2019-12-11 DIAGNOSIS — M21372 Foot drop, left foot: Secondary | ICD-10-CM | POA: Diagnosis not present

## 2019-12-11 NOTE — Therapy (Addendum)
[facility] [address] [place], [place], [address] Phone: [phone]   Fax:  [phone]  Physical Therapy Treatment and 10th visit Progress Note  Patient Details  Name: Johnathan Ross MRN: [id number] Date of Birth: 10/11/1970 Referring Provider (PT): Carylon Perches, MD   Encounter Date: 12/11/2019   Progress Note Reporting Period 10/30/2019 to 12/11/2019  See note below for Objective Data and Assessment of Progress/Goals.   Johnathan Ross, PT, DPT 12/11/19    3:44 PM      PT End of Session - 12/11/19 0852    Visit Number 70    Number of Visits 71    Date for PT Re-Evaluation 01/02/20    Authorization Type Medicare Part A and B; 10th visit PN    Progress Note Due on Visit 79    PT Start Time 0847    PT Stop Time 0930    PT Time Calculation (min) 43 min    Equipment Utilized During Treatment Gait belt    Activity Tolerance Patient tolerated treatment well;Patient limited by fatigue;Other (comment)   limited by increased tone in left LE this session   Behavior During Therapy [facility] for tasks assessed/performed           Past Medical History:  Diagnosis Date  . Hyperlipidemia   . Hypertension   . Hypogonadism in male 11/01/2016  . Hypothyroidism   . Multiple sclerosis ([place])   . Proliferative diabetic retinopathy(362.02)   . Type 1 diabetes mellitus ([place]) dx'd 1994  . Vitamin D insufficiency 11/29/2016    Past Surgical History:  Procedure Laterality Date  . EYE SURGERY Bilateral    "laser for diabetic retinopathy"  . FRACTURE SURGERY    . OPEN REDUCTION INTERNAL FIXATION (ORIF) TIBIA/FIBULA FRACTURE Right 10/30/2016  . ORIF ANKLE FRACTURE Left 2015  . ORIF TIBIA FRACTURE Right 10/30/2016   Procedure: OPEN REDUCTION INTERNAL FIXATION (ORIF) TIBIA FIBULA FRACTURE;  Surgeon: Altamese Lorimor, MD;  Location: [facility];  Service: Orthopedics;  Laterality: Right;  . RETINAL LASER PROCEDURE Bilateral    "for  diabetic retinopathy"    There were no vitals filed for this visit.   Subjective Assessment - 12/11/19 0850    Subjective Reports he woke up feeling weak this morning, almost didn't come to therapy today. Of note, pt was able to walk entire way from lobby to mat in gym without stopping and without any spasms locking the knees out.    Patient is accompained by: Family member   mom in car   Pertinent History multiple falls, vitamin D insufficiency, Type 1 IDDM, diabetic retinopathy, hypothyroidism, HTN, hyperlipidemia, R tibia fracture, L ankle fracture with ORIF    Limitations Walking    Diagnostic tests No new lesions seen on MRI    Patient Stated Goals To improve walking    Currently in Pain? No/denies    Pain Score 0-No pain                 [facility] Adult PT Treatment/Exercise - 12/11/19 0853      Transfers   Transfers Sit to Stand;Stand to Sit    Sit to Stand 5: Supervision;With upper extremity assist;From bed;From chair/3-in-1    Stand to Sit 5: Supervision;With upper extremity assist;To bed;To chair/3-in-1    Stand Pivot Transfers --    Floor to Transfer 5: Supervision;With upper extremity assist      Ambulation/Gait   Ambulation/Gait Yes    Ambulation/Gait Assistance 4: Min guard;4:  Min assist    Ambulation/Gait Assistance Details 1 person min assist with second person nearby for safety with use of forarm crutches today. Increased episodes of spasms/left knee locking out with pt needing to stop 3-4 times. no significant loss of balance with gait with forearm crutches noted.     Ambulation Distance (Feet) 115 Feet   x1   Assistive device Rolling walker;Lofstrands    Gait Pattern Step-through pattern;Poor foot clearance - left;Left genu recurvatum;Decreased hip/knee flexion - right;Decreased hip/knee flexion - left;Decreased dorsiflexion - right;Poor foot clearance - right;Narrow base of support    Ambulation Surface Level;Indoor      High Level Balance   High Level Balance  Comments side stepping in squat position left<>right for 3 laps each way with light UE support on bars, cues on form/technique, min guard assist.       Neuro Re-ed    Neuro Re-ed Details  for strengthening/muscle re-ed: tall kneeling on red mat on floor next to blue mat table with sustained green band pull at hips/pelvis in lateral direction- alternating UE raises, with hands at sides for head movements up<>down, then left<>right, then moving LE out/back in (moving LE that has band on it), all for 10 reps each. intermittent touch to mat for balance. rest breaks taken between ex's. performed with static pull on both sides.                  PT Short Term Goals - 12/04/19 1433      PT SHORT TERM GOAL #1   Title Patient will demonstrate independence with updated HEP    Baseline 12/02/19: met with current program which pt reports is still challenging    Status Achieved    Target Date 12/03/19      PT SHORT TERM GOAL #2   Title Pt will improve gait velocity with RW to >/= 0.8 ft/sec    Baseline 12/02/19: 0.64 ft/sec with RW, improved just not to goal with pt ambulating "his way" with LE's locked out    Status Partially Met    Target Date 12/03/19      PT SHORT TERM GOAL #3   Title Pt will improve BERG to >/= 42/56    Baseline 12/02/19: 41/56 scored today, improved from 38/56, just not to goal    Status Partially Met    Target Date 12/03/19      PT SHORT TERM GOAL #4   Title Pt will ambulate x 230' over flat, indoor surfaces with trekking poles with min A and intermittent standing rest breaks.    Baseline 12/04/19: min assist of 2 for up to 40 feet today. Has gone as far as 100 feet with 2 person assist in prior session.    Time --    Period --    Status Partially Met    Target Date 12/03/19      PT SHORT TERM GOAL #5   Title Pt will negotate 8 stairs with two rails step alternating sequence with supervision    Baseline 12/04/19: met in session today    Time --    Period --    Status  Achieved    Target Date 12/03/19             PT Long Term Goals - 10/04/19 1811      PT LONG TERM GOAL #1   Title Pt will demonstrate independence with land HEP    Time 12    Period Weeks    Status  Revised    Target Date 01/02/20      PT LONG TERM GOAL #2   Title Pt will improve gait velocity with cane to >/= 0.8 ft/sec    Baseline Not assessed with cane    Time 12    Period Weeks    Status Revised    Target Date 01/02/20      PT LONG TERM GOAL #3   Title Pt will improve BERG to >/= 46/56 to decrease falls risk    Time 12    Period Weeks    Status Revised    Target Date 01/02/20      PT LONG TERM GOAL #4   Title Pt will ambulate x 200' outside over pavement and grass with supervision and cane to improve safety ambulating to wood working shop at home    Time 12    Period Weeks    Status Revised    Target Date 01/02/20      PT LONG TERM GOAL #5   Title Pt will negotiate 8 stairs with one rail and cane MOD I for safe home entry/exit with cane    Time 12    Period Weeks    Status Revised    Target Date 01/02/20                 Plan - 12/11/19 4235    Clinical Impression Statement Today's skilled session continued to focus on LE/core strengthening with emphasis on knee flexion and gait with forearm crutches. Rest breaks taken due to fatigue. The pt is progressing toward goals and should benefit from continued PT to progress toward unmet goals.    Comorbidities multiple falls, vitamin D insufficiency, Type 1 IDDM, diabetic retinopathy, hypothyroidism, HTN, hyperlipidemia, R tibia fracture, L ankle fracture with ORIF    Rehab Potential Good    PT Frequency 2x / week    PT Duration 12 weeks    PT Treatment/Interventions ADLs/Self Care Home Management;Aquatic Therapy;Electrical Stimulation;DME Instruction;Gait training;Stair training;Functional mobility training;Therapeutic activities;Therapeutic exercise;Balance training;Neuromuscular re-education;Patient/family  education;Orthotic Fit/Training;Passive range of motion;Energy conservation    PT Next Visit Plan continue with forearm crutches with gait for decreased UE support with gait, otherwise use single UE support on bar/counter. Continue activities to promote more left hip/knee flexion, continue to work on static standing balance with no UE support and hip/core strengthening in standing so not to aggrivate knee    Consulted and Agree with Plan of Care Patient           Patient will benefit from skilled therapeutic intervention in order to improve the following deficits and impairments:  Abnormal gait, Decreased balance, Decreased endurance, Decreased coordination, Decreased range of motion, Decreased strength, Difficulty walking, Impaired tone, Postural dysfunction, Decreased activity tolerance, Decreased mobility  Visit Diagnosis: Muscle weakness (generalized)  Unsteadiness on feet  Difficulty in walking, not elsewhere classified  Other abnormalities of gait and mobility  Foot drop, right  Foot drop, left     Problem List Patient Active Problem List   Diagnosis Date Noted  . Gait disturbance 08/19/2019  . Acquired diplegia ([place]) 08/19/2019  . Vitamin D insufficiency 11/29/2016  . Hypogonadism in male 11/01/2016  . 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 Hypothyroidism   . Hypertension   . Hyperlipidemia   . Closed fracture of right fibula and tibia 10/29/2016  . Closed tibia fracture 10/29/2016  . Closed fracture of right tibial plafond with fibula involvement 10/29/2016  . Abnormal liver function  tests 03/06/2013  . RASH AND OTHER NONSPECIFIC SKIN ERUPTION 10/07/2009  . SHOULDER PAIN, LEFT 12/27/2008  . Type I (juvenile type) diabetes mellitus without mention of complication, uncontrolled 12/03/2007  . HYPOTHYROIDISM 11/12/2007  . HYPERCHOLESTEROLEMIA 11/12/2007  . MULTIPLE SCLEROSIS 08/29/2007  . PROLIFERATIVE DIABETIC RETINOPATHY 08/29/2007  .  HYPERTENSION 12/25/2006    Willow Ora, PTA, [facility] [address], [place], [place] [address] [phone] 12/11/19, 9:59 AM   Johnathan Ross, PT, DPT 12/11/19    3:44 PM     Name: Johnathan Ross MRN: [id number] Date of Birth: 01-09-1971

## 2019-12-14 ENCOUNTER — Telehealth (INDEPENDENT_AMBULATORY_CARE_PROVIDER_SITE_OTHER): Payer: Self-pay

## 2019-12-14 IMAGING — MR MR HEAD WO/W CM
14 series · 48 of 48 positions shown · IV contrast (multihance)
Comparison: MRI of the brain 07/01/2015.

CLINICAL DATA: Multiple sclerosis.  Difficulty walking.  Weakness.
TECHNIQUE: Multiplanar, multiecho pulse sequences of the brain and surrounding
structures were obtained without and with intravenous contrast.

CONTRAST:  20mL MULTIHANCE GADOBENATE DIMEGLUMINE 529 MG/ML IV SOLN

[Series 2: t1_se_sag · sagittal · 5.0mm · 0.45mm/px · 1 of 23 slices shown]
[im 1/23]
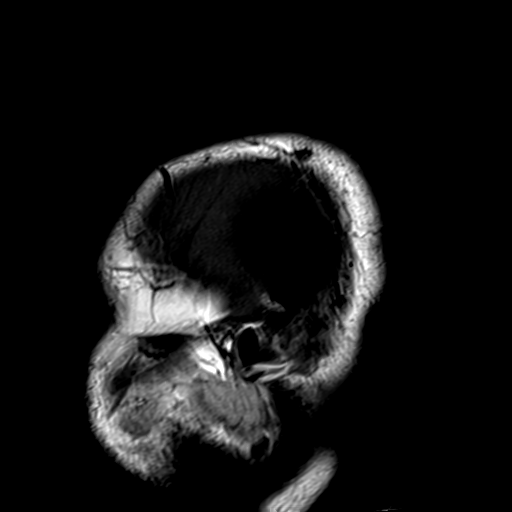

[Series 3: FLAIR · sagittal · 5.0mm · 0.45mm/px · 1 of 27 slices shown (1 of 2)]
[im 1/27]
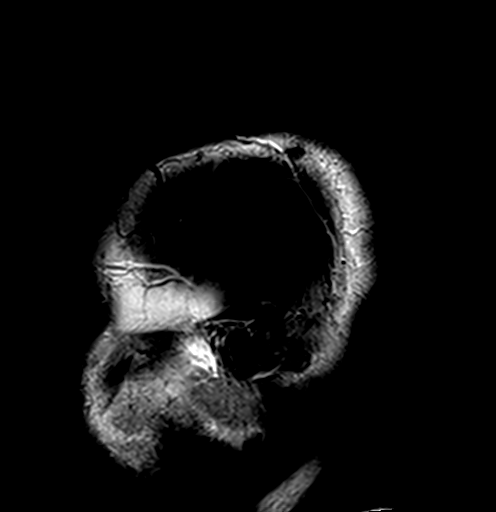

[Series 4: ep2d_diff_(id)_trace · axial · 3.0mm · 1.80mm/px · z∈[-8,+136]mm · 5 of 100 slices shown]
[im 1/100]
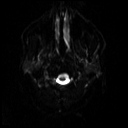
[im 25/100]
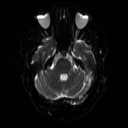
[im 50/100]
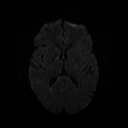
[im 75/100]
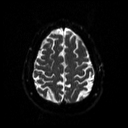
[im 100/100]
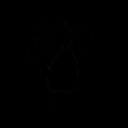

[Series 5: ep2d_diff_(id)_trace_adc · axial · 3.0mm · 1.80mm/px · z∈[-8,+136]mm · 3 of 50 slices shown]
[im 1/50]
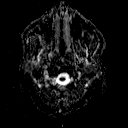
[im 25/50]
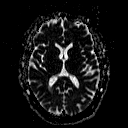
[im 50/50]
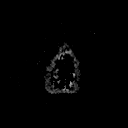

[Series 6: ep2d_diff_cor · coronal · 5.0mm · 1.77mm/px · 3 of 50 slices shown]
[im 1/50]
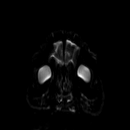
[im 25/50]
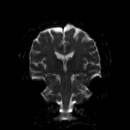
[im 50/50]
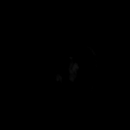

[Series 7: ep2d_diff_cor_adc · coronal · 5.0mm · 1.77mm/px · 2 of 26 slices shown]
[im 1/26]
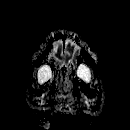
[im 26/26]
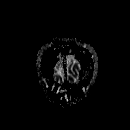

[Series 8: FLAIR · axial · 3.0mm · 0.45mm/px · z∈[-8,+139]mm · 2 of 26 slices shown (2 of 2)]
[im 1/26]
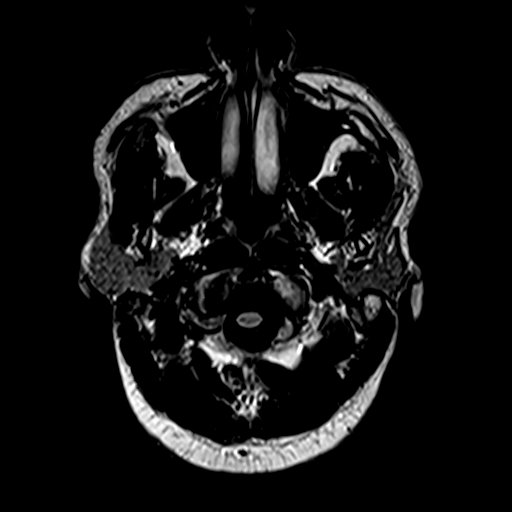
[im 26/26]
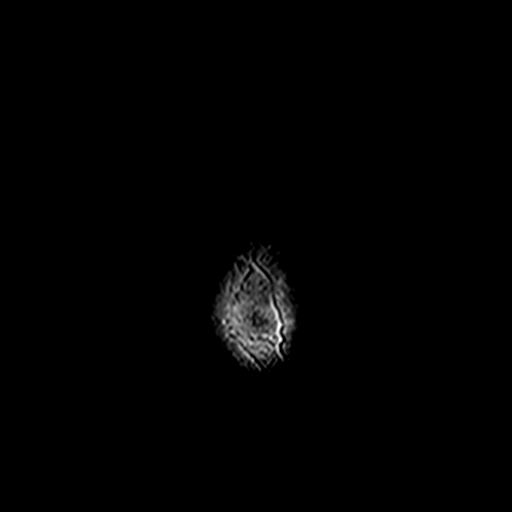

[Series 9: t2_tse_tra · axial · 5.0mm · 0.60mm/px · z∈[-5,+142]mm · 2 of 26 slices shown]
[im 1/26]
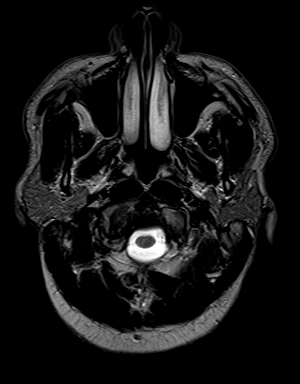
[im 26/26]
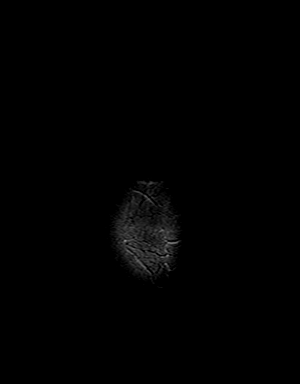

[Series 10: mip_images(sw) · axial · 16.0mm · 0.90mm/px · z∈[-2,+139]mm · 4 of 73 slices shown]
[im 1/73]
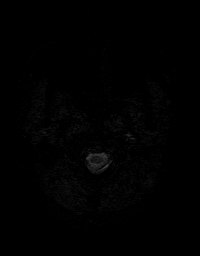
[im 25/73]
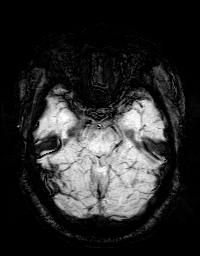
[im 49/73]
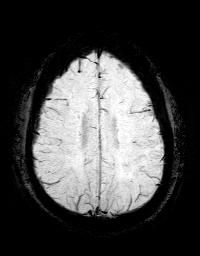
[im 73/73]
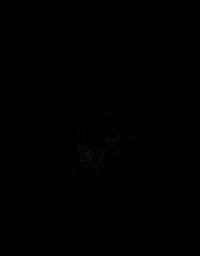

[Series 11: swi_images · axial · 2.0mm · 0.90mm/px · z∈[-9,+146]mm · 5 of 80 slices shown]
[im 1/80]
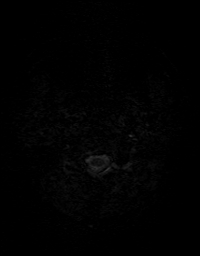
[im 20/80]
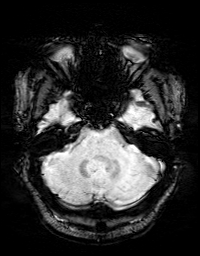
[im 40/80]
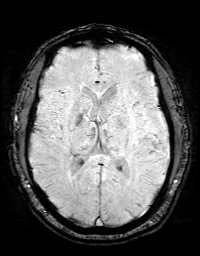
[im 60/80]
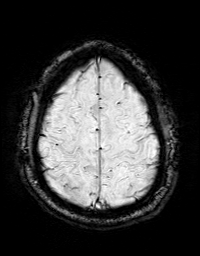
[im 80/80]
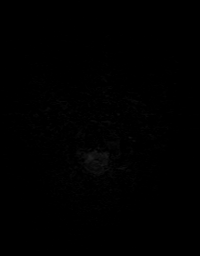

[Series 12: t1_mpr_tra · axial · 1.0mm · 0.72mm/px · z∈[-2,+138]mm · 8 of 144 slices shown]
[im 1/144]
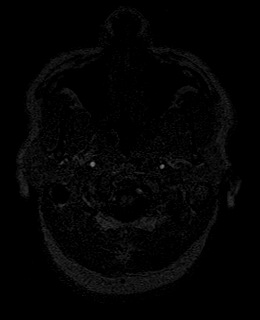
[im 21/144]
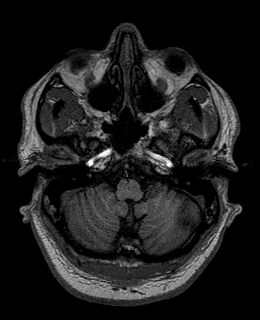
[im 41/144]
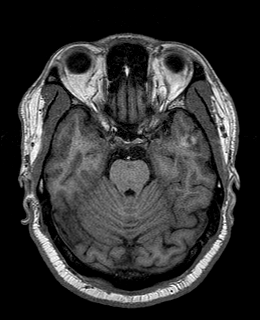
[im 62/144]
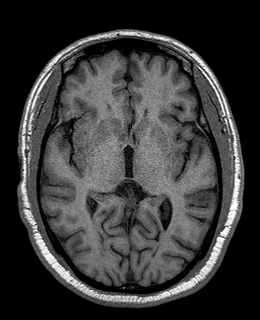
[im 82/144]
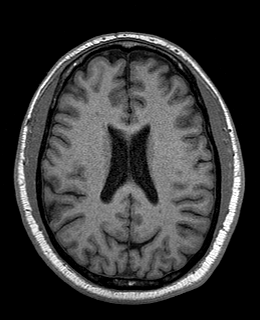
[im 103/144]
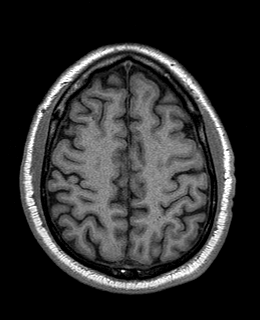
[im 123/144]
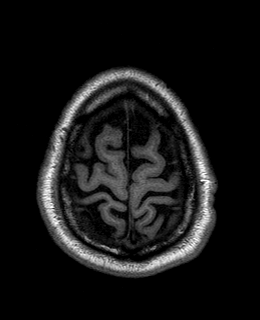
[im 144/144]
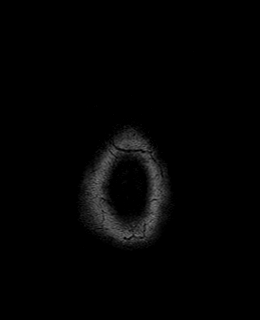

[Series 13: T2 post-contrast · coronal · 5.0mm · 0.45mm/px · 2 of 30 slices shown]
[im 1/30]
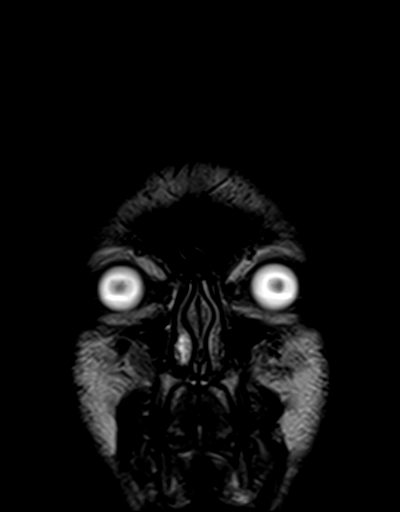
[im 30/30]
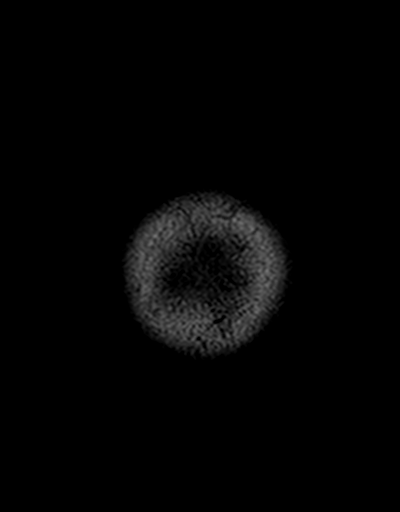

[Series 14: post t1_mpr_tra · axial · 1.0mm · 0.72mm/px · z∈[-2,+138]mm · 8 of 144 slices shown]
[im 1/144]
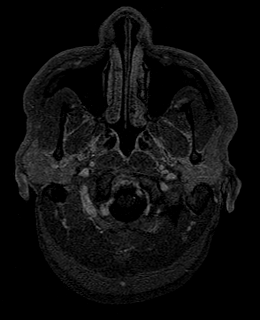
[im 21/144]
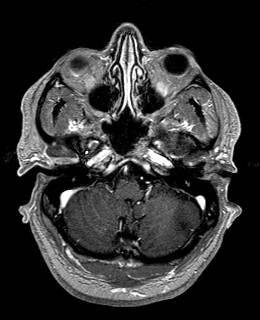
[im 41/144]
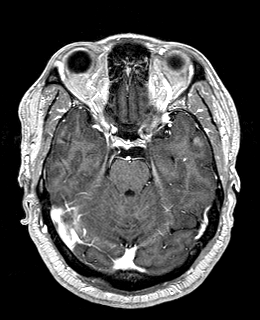
[im 62/144]
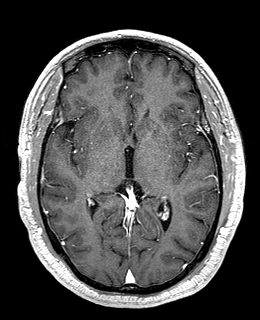
[im 82/144]
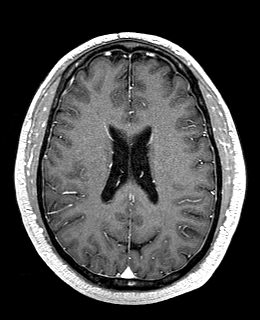
[im 103/144]
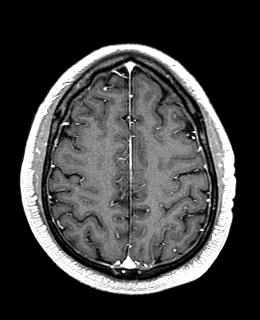
[im 123/144]
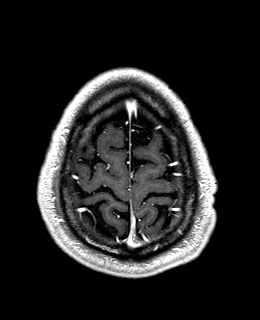
[im 144/144]
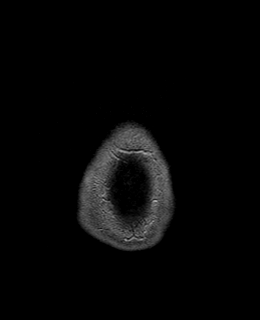

[Series 15: T1 post-contrast · coronal · 5.0mm · 0.72mm/px · 2 of 30 slices shown]
[im 1/30]
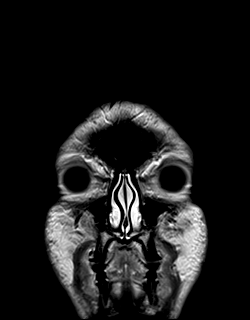
[im 30/30]
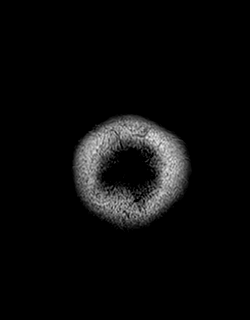

[48 of 48 positions shown; findings below may reference images not displayed]

Creatinine was obtained on site at [HOSPITAL] at [HOSPITAL].

Results: Creatinine 1.1 mg/dL.

The patient brought his own walker into zone 3 of the MRI facility.
At the edge of the door into zone 4, the walker was pulled to the
magnet. The patient fell and sustained a cut to the fifth digit of
his right hand and abrasion to the elbow.

I personally examined scratched at the patient. He was neuro
vascularly intact. He did not complain of any pain or other
injuries.

We were able to complete his exam.

Using the MRI compatible walker, the patient sustained an additional
fall after exiting the scanner. This fall was in zone 2.

He did not complain of any additional injuries from the second fall.
He was assisted to change back into his own clothes.

He was then transported to his car by wheelchair.

I discussed the incidents and results of the exam with Dr. Tiger.

EXAM:
MRI HEAD WITHOUT AND WITH CONTRAST
FINDINGS: Brain: Extensive periventricular T2 hyperintensities with
involvement of the corpus callosum are stable from the prior exam.
Multiple subcortical lesions are stable is well. No new lesions are
present. There is no restricted diffusion or focal enhancement
associated with any of the lesions.

No acute infarct, hemorrhage, or mass lesion is present. The
ventricles are of normal size. No significant extraaxial fluid
collection is present.

Vascular: Flow is present in the major intracranial arteries.

Skull and upper cervical spine: Skull base is within normal limits.
The craniocervical junction is within normal limits.

Sinuses/Orbits: The paranasal sinuses are clear. There is some fluid
in left mastoid air cells. No obstructing nasopharyngeal lesion is
present. Globes and orbits are within normal limits.
IMPRESSION: 1. Diffuse periventricular T2 hyperintensities with involving the
corpus callosum are stable from the prior exam.
2. Scattered subcortical and upper cervical spine lesions are also
stable.
3. No new lesions, restricted diffusion, or focal enhancement to
suggest progression of disease.
4. No acute intracranial abnormality.

## 2019-12-14 NOTE — Telephone Encounter (Signed)
Mr. Cutrone called c/o foreign body sensation, red, runny and sensitive OD. Pt states this started on Friday. H/o corneal abrasion. Pt was instructed to see his general ophthalmologist or the doctor on call (Dr. Coralyn Pear).

## 2019-12-16 ENCOUNTER — Ambulatory Visit: Payer: Medicare Other | Admitting: Physical Therapy

## 2019-12-16 ENCOUNTER — Encounter: Payer: Self-pay | Admitting: Physical Therapy

## 2019-12-16 ENCOUNTER — Other Ambulatory Visit: Payer: Self-pay

## 2019-12-16 DIAGNOSIS — R2681 Unsteadiness on feet: Secondary | ICD-10-CM

## 2019-12-16 DIAGNOSIS — R262 Difficulty in walking, not elsewhere classified: Secondary | ICD-10-CM

## 2019-12-16 DIAGNOSIS — M21372 Foot drop, left foot: Secondary | ICD-10-CM

## 2019-12-16 DIAGNOSIS — M6281 Muscle weakness (generalized): Secondary | ICD-10-CM

## 2019-12-16 DIAGNOSIS — M21371 Foot drop, right foot: Secondary | ICD-10-CM

## 2019-12-16 DIAGNOSIS — R2689 Other abnormalities of gait and mobility: Secondary | ICD-10-CM | POA: Diagnosis not present

## 2019-12-17 NOTE — Therapy (Signed)
[facility] [address] [place], [place], [address] Phone: [phone]   Fax:  [phone]  Physical Therapy Treatment  Patient Details  Name: Johnathan Ross MRN: [id number] Date of Birth: [birth year] Referring Provider (PT): Carylon Perches, MD   Encounter Date: 12/16/2019     12/16/19 0852  PT Visits / Re-Eval  Visit Number 64  Number of Visits 51  Date for PT Re-Evaluation 01/02/20  Authorization  Authorization Type Medicare Part A and B; 10th visit PN  Progress Note Due on Visit 80  PT Time Calculation  PT Start Time 0846  PT Stop Time 0930  PT Time Calculation (min) 44 min  PT - End of Session  Equipment Utilized During Treatment Gait belt  Activity Tolerance Patient tolerated treatment well;Patient limited by fatigue;Other (comment) (limited by increased tone in left LE this session)  Behavior During Therapy [facility] for tasks assessed/performed    Past Medical History:  Diagnosis Date  . Hyperlipidemia   . Hypertension   . Hypogonadism in male 11/01/2016  . Hypothyroidism   . Multiple sclerosis ([place])   . Proliferative diabetic retinopathy(362.02)   . Type 1 diabetes mellitus ([place]) dx'd 1994  . Vitamin D insufficiency 11/29/2016    Past Surgical History:  Procedure Laterality Date  . EYE SURGERY Bilateral    "laser for diabetic retinopathy"  . FRACTURE SURGERY    . OPEN REDUCTION INTERNAL FIXATION (ORIF) TIBIA/FIBULA FRACTURE Right 10/30/2016  . ORIF ANKLE FRACTURE Left 2015  . ORIF TIBIA FRACTURE Right 10/30/2016   Procedure: OPEN REDUCTION INTERNAL FIXATION (ORIF) TIBIA FIBULA FRACTURE;  Surgeon: Altamese Greenwood, MD;  Location: [facility];  Service: Orthopedics;  Laterality: Right;  . RETINAL LASER PROCEDURE Bilateral    "for diabetic retinopathy"    There were no vitals filed for this visit.      12/16/19 0850  Symptoms/Limitations  Subjective No new complaints. No falls. Still with some  tenderness/soreness in his left elbow, not sure what from. Only notices it when he fully bends his elbow.  Patient is accompained by: Family member (mom in car)  Limitations Walking  Diagnostic tests No new lesions seen on MRI  Patient Stated Goals To improve walking  Pain Assessment  Currently in Pain? No/denies  Pain Score 0       12/16/19 0853  Transfers  Transfers Sit to Stand;Stand to Sit  Sit to Stand 5: Supervision;With upper extremity assist;From bed;From chair/3-in-1  Stand to Sit 5: Supervision;With upper extremity assist;To bed;To chair/3-in-1  Ambulation/Gait  Ambulation/Gait Yes  Ambulation/Gait Assistance 4: Min guard;4: Min assist  Ambulation/Gait Assistance Details use of RW with supervision for entering/exit of gym. use of bil forearm cruthes with gait in session wiht one person min guard to min assist for balance, second person for standby for safety. cues on sequencing, crutch placement and weight shifting with gait.   Ambulation Distance (Feet) 230 Feet (x1)  Assistive device Rolling walker;Lofstrands  Gait Pattern Step-through pattern;Poor foot clearance - left;Left genu recurvatum;Decreased hip/knee flexion - right;Decreased hip/knee flexion - left;Decreased dorsiflexion - right;Poor foot clearance - right;Narrow base of support  Ambulation Surface Level;Indoor  Neuro Re-ed   Neuro Re-ed Details  for strengthening/muscle re-ed: on red mat next to blue mat table: in tall kneeling with 2# weighted ball- sit backs/mini squats with simultaneous CP for 10 reps, then upper trunk rotation with reaching up over one shoulder down to floor on opposite side for 10 rep each way. Min guard assist  with occasional UE support for balance; with green band resistance pulling in lateral direction for alternating UE raises, then with arms at sides for head movements left<>right, up<>down. Occasional touch to mat for balance with cues on form/posture. Performed with pulling toward both  sides. Rest breaks taken as needed.         PT Short Term Goals - 12/04/19 1433      PT SHORT TERM GOAL #1   Title Patient will demonstrate independence with updated HEP    Baseline 12/02/19: met with current program which pt reports is still challenging    Status Achieved    Target Date 12/03/19      PT SHORT TERM GOAL #2   Title Pt will improve gait velocity with RW to >/= 0.8 ft/sec    Baseline 12/02/19: 0.64 ft/sec with RW, improved just not to goal with pt ambulating "his way" with LE's locked out    Status Partially Met    Target Date 12/03/19      PT SHORT TERM GOAL #3   Title Pt will improve BERG to >/= 42/56    Baseline 12/02/19: 41/56 scored today, improved from 38/56, just not to goal    Status Partially Met    Target Date 12/03/19      PT SHORT TERM GOAL #4   Title Pt will ambulate x 230' over flat, indoor surfaces with trekking poles with min A and intermittent standing rest breaks.    Baseline 12/04/19: min assist of 2 for up to 40 feet today. Has gone as far as 100 feet with 2 person assist in prior session.    Time --    Period --    Status Partially Met    Target Date 12/03/19      PT SHORT TERM GOAL #5   Title Pt will negotate 8 stairs with two rails step alternating sequence with supervision    Baseline 12/04/19: met in session today    Time --    Period --    Status Achieved    Target Date 12/03/19             PT Long Term Goals - 10/04/19 1811      PT LONG TERM GOAL #1   Title Pt will demonstrate independence with land HEP    Time 12    Period Weeks    Status Revised    Target Date 01/02/20      PT LONG TERM GOAL #2   Title Pt will improve gait velocity with cane to >/= 0.8 ft/sec    Baseline Not assessed with cane    Time 12    Period Weeks    Status Revised    Target Date 01/02/20      PT LONG TERM GOAL #3   Title Pt will improve BERG to >/= 46/56 to decrease falls risk    Time 12    Period Weeks    Status Revised    Target Date  01/02/20      PT LONG TERM GOAL #4   Title Pt will ambulate x 200' outside over pavement and grass with supervision and cane to improve safety ambulating to wood working shop at home    Time 12    Period Weeks    Status Revised    Target Date 01/02/20      PT LONG TERM GOAL #5   Title Pt will negotiate 8 stairs with one rail and cane MOD I for  safe home entry/exit with cane    Time 12    Period Weeks    Status Revised    Target Date 01/02/20             12/16/19 6725  Plan  Clinical Impression Statement Today's skilled session continued to focus on core/hip strengthening and gait with forearm crutches. Rest breaks taken as needed due to fatigue. The pt should benefit from continued PT to progress toward unmet goals.  Comorbidities multiple falls, vitamin D insufficiency, Type 1 IDDM, diabetic retinopathy, hypothyroidism, HTN, hyperlipidemia, R tibia fracture, L ankle fracture with ORIF  Pt will benefit from skilled therapeutic intervention in order to improve on the following deficits Abnormal gait;Decreased balance;Decreased endurance;Decreased coordination;Decreased range of motion;Decreased strength;Difficulty walking;Impaired tone;Postural dysfunction;Decreased activity tolerance;Decreased mobility  Rehab Potential Good  PT Frequency 2x / week  PT Duration 12 weeks  PT Treatment/Interventions ADLs/Self Care Home Management;Aquatic Therapy;Electrical Stimulation;DME Instruction;Gait training;Stair training;Functional mobility training;Therapeutic activities;Therapeutic exercise;Balance training;Neuromuscular re-education;Patient/family education;Orthotic Fit/Training;Passive range of motion;Energy conservation  PT Next Visit Plan continue with forearm crutches with gait for decreased UE support with gait, otherwise use single UE support on bar/counter. Continue activities to promote more left hip/knee flexion, continue to work on static standing balance with no UE support and hip/core  strengthening in standing so not to aggrivate knee  Consulted and Agree with Plan of Care Patient          Patient will benefit from skilled therapeutic intervention in order to improve the following deficits and impairments:  Abnormal gait, Decreased balance, Decreased endurance, Decreased coordination, Decreased range of motion, Decreased strength, Difficulty walking, Impaired tone, Postural dysfunction, Decreased activity tolerance, Decreased mobility  Visit Diagnosis: Muscle weakness (generalized)  Unsteadiness on feet  Difficulty in walking, not elsewhere classified  Other abnormalities of gait and mobility  Foot drop, right  Foot drop, left     Problem List Patient Active Problem List   Diagnosis Date Noted  . Gait disturbance 08/19/2019  . Acquired diplegia ([place]) 08/19/2019  . Vitamin D insufficiency 11/29/2016  . Hypogonadism in male 11/01/2016  . 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 Hypothyroidism   . Hypertension   . Hyperlipidemia   . Closed fracture of right fibula and tibia 10/29/2016  . Closed tibia fracture 10/29/2016  . Closed fracture of right tibial plafond with fibula involvement 10/29/2016  . Abnormal liver function tests 03/06/2013  . RASH AND OTHER NONSPECIFIC SKIN ERUPTION 10/07/2009  . SHOULDER PAIN, LEFT 12/27/2008  . Type I (juvenile type) diabetes mellitus without mention of complication, uncontrolled 12/03/2007  . HYPOTHYROIDISM 11/12/2007  . HYPERCHOLESTEROLEMIA 11/12/2007  . MULTIPLE SCLEROSIS 08/29/2007  . PROLIFERATIVE DIABETIC RETINOPATHY 08/29/2007  . HYPERTENSION 12/25/2006   Willow Ora, PTA, [facility] [address] [place], [place] [address] [phone] 12/17/19, 10:27 PM   Name: Johnathan Ross MRN: [id number] Date of Birth: [birth year]

## 2019-12-18 ENCOUNTER — Other Ambulatory Visit: Payer: Self-pay

## 2019-12-18 ENCOUNTER — Encounter: Payer: Self-pay | Admitting: Physical Therapy

## 2019-12-18 ENCOUNTER — Ambulatory Visit: Payer: Medicare Other | Admitting: Physical Therapy

## 2019-12-18 DIAGNOSIS — R2681 Unsteadiness on feet: Secondary | ICD-10-CM

## 2019-12-18 DIAGNOSIS — M21371 Foot drop, right foot: Secondary | ICD-10-CM

## 2019-12-18 DIAGNOSIS — M6281 Muscle weakness (generalized): Secondary | ICD-10-CM | POA: Diagnosis not present

## 2019-12-18 DIAGNOSIS — R262 Difficulty in walking, not elsewhere classified: Secondary | ICD-10-CM

## 2019-12-18 DIAGNOSIS — R2689 Other abnormalities of gait and mobility: Secondary | ICD-10-CM | POA: Diagnosis not present

## 2019-12-18 DIAGNOSIS — M21372 Foot drop, left foot: Secondary | ICD-10-CM

## 2019-12-18 NOTE — Therapy (Signed)
[facility] [address] [place], [place], [address] Phone: [phone]   Fax:  [phone]  Physical Therapy Treatment  Patient Details  Name: Johnathan Ross MRN: [id number] Date of Birth: [birth year] Referring Provider (PT): Carylon Perches, MD   Encounter Date: 12/18/2019   PT End of Session - 12/18/19 0854    Visit Number 72    Number of Visits 76    Date for PT Re-Evaluation 01/02/20    Authorization Type Medicare Part A and B; 10th visit PN    Progress Note Due on Visit 70    PT Start Time 0849    PT Stop Time 0930    PT Time Calculation (min) 41 min    Equipment Utilized During Treatment Gait belt    Activity Tolerance Patient tolerated treatment well;Patient limited by fatigue    Behavior During Therapy [facility] for tasks assessed/performed           Past Medical History:  Diagnosis Date  . Hyperlipidemia   . Hypertension   . Hypogonadism in male 11/01/2016  . Hypothyroidism   . Multiple sclerosis ([place])   . Proliferative diabetic retinopathy(362.02)   . Type 1 diabetes mellitus ([place]) dx'd 1994  . Vitamin D insufficiency 11/29/2016    Past Surgical History:  Procedure Laterality Date  . EYE SURGERY Bilateral    "laser for diabetic retinopathy"  . FRACTURE SURGERY    . OPEN REDUCTION INTERNAL FIXATION (ORIF) TIBIA/FIBULA FRACTURE Right 10/30/2016  . ORIF ANKLE FRACTURE Left 2015  . ORIF TIBIA FRACTURE Right 10/30/2016   Procedure: OPEN REDUCTION INTERNAL FIXATION (ORIF) TIBIA FIBULA FRACTURE;  Surgeon: Altamese [place], MD;  Location: [facility];  Service: Orthopedics;  Laterality: Right;  . RETINAL LASER PROCEDURE Bilateral    "for diabetic retinopathy"    There were no vitals filed for this visit.   Subjective Assessment - 12/18/19 0853    Subjective No new complaints. No falls or pain to report.    Patient is accompained by: Family member   mom in car   Pertinent History multiple falls, vitamin D  insufficiency, Type 1 IDDM, diabetic retinopathy, hypothyroidism, HTN, hyperlipidemia, R tibia fracture, L ankle fracture with ORIF    Limitations Walking    Diagnostic tests No new lesions seen on MRI    Patient Stated Goals To improve walking    Currently in Pain? No/denies    Pain Score 0-No pain                    [facility] Adult PT Treatment/Exercise - 12/18/19 0855      Transfers   Transfers Sit to Stand;Stand to Sit    Sit to Stand 5: Supervision;With upper extremity assist;From bed;From chair/3-in-1    Stand to Sit 5: Supervision;With upper extremity assist;To bed;To chair/3-in-1      Ambulation/Gait   Ambulation/Gait Yes    Ambulation/Gait Assistance 5: Supervision;4: Min guard;4: Min assist    Ambulation/Gait Assistance Details supervision with RW to enter/exit gym. use of forearm crutches in session with min guard to min assist of 1 person.     Ambulation Distance (Feet) 65 Feet   x2 with crutches   Assistive device Rolling walker;Lofstrands    Gait Pattern Step-through pattern;Poor foot clearance - left;Left genu recurvatum;Decreased hip/knee flexion - right;Decreased hip/knee flexion - left;Decreased dorsiflexion - right;Poor foot clearance - right;Narrow base of support    Ambulation Surface Level;Indoor      Neuro Re-ed  Neuro Re-ed Details  for strengthening/muscle re-ed: alternating LE's forward pushing of BOSU in paralell bars for 6 laps with emphasis on hip/knee flexion>extension with increased difficulty with left LE; then with green band resistance around LE's above knees- side stepping in squat position for 3 laps each way, then fwd/bwd diagonal stepping for 3 laps each way, cues for soft knees and ex form/technique. min guard assist with bil UE support on bars.                 PT Short Term Goals - 12/04/19 1433      PT SHORT TERM GOAL #1   Title Patient will demonstrate independence with updated HEP    Baseline 12/02/19: met with current program  which pt reports is still challenging    Status Achieved    Target Date 12/03/19      PT SHORT TERM GOAL #2   Title Pt will improve gait velocity with RW to >/= 0.8 ft/sec    Baseline 12/02/19: 0.64 ft/sec with RW, improved just not to goal with pt ambulating "his way" with LE's locked out    Status Partially Met    Target Date 12/03/19      PT SHORT TERM GOAL #3   Title Pt will improve BERG to >/= 42/56    Baseline 12/02/19: 41/56 scored today, improved from 38/56, just not to goal    Status Partially Met    Target Date 12/03/19      PT SHORT TERM GOAL #4   Title Pt will ambulate x 230' over flat, indoor surfaces with trekking poles with min A and intermittent standing rest breaks.    Baseline 12/04/19: min assist of 2 for up to 40 feet today. Has gone as far as 100 feet with 2 person assist in prior session.    Time --    Period --    Status Partially Met    Target Date 12/03/19      PT SHORT TERM GOAL #5   Title Pt will negotate 8 stairs with two rails step alternating sequence with supervision    Baseline 12/04/19: met in session today    Time --    Period --    Status Achieved    Target Date 12/03/19             PT Long Term Goals - 10/04/19 1811      PT LONG TERM GOAL #1   Title Pt will demonstrate independence with land HEP    Time 12    Period Weeks    Status Revised    Target Date 01/02/20      PT LONG TERM GOAL #2   Title Pt will improve gait velocity with cane to >/= 0.8 ft/sec    Baseline Not assessed with cane    Time 12    Period Weeks    Status Revised    Target Date 01/02/20      PT LONG TERM GOAL #3   Title Pt will improve BERG to >/= 46/56 to decrease falls risk    Time 12    Period Weeks    Status Revised    Target Date 01/02/20      PT LONG TERM GOAL #4   Title Pt will ambulate x 200' outside over pavement and grass with supervision and cane to improve safety ambulating to wood working shop at home    Time 12    Period Weeks    Status  Revised    Target Date 01/02/20      PT LONG TERM GOAL #5   Title Pt will negotiate 8 stairs with one rail and cane MOD I for safe home entry/exit with cane    Time 12    Period Weeks    Status Revised    Target Date 01/02/20                 Plan - 12/18/19 0854    Clinical Impression Statement Today's skilled session continued to focus on LE strengthening and gait with forearm crutches for decreased UE reliance with gait. A few times pt needed to stop and "reset" with gait due to extensor tone, no significant balance loss noted. The pt is progressing toward goals and should benefit from continued PT to progress toward unmet goals.    Comorbidities multiple falls, vitamin D insufficiency, Type 1 IDDM, diabetic retinopathy, hypothyroidism, HTN, hyperlipidemia, R tibia fracture, L ankle fracture with ORIF    Rehab Potential Good    PT Frequency 2x / week    PT Duration 12 weeks    PT Treatment/Interventions ADLs/Self Care Home Management;Aquatic Therapy;Electrical Stimulation;DME Instruction;Gait training;Stair training;Functional mobility training;Therapeutic activities;Therapeutic exercise;Balance training;Neuromuscular re-education;Patient/family education;Orthotic Fit/Training;Passive range of motion;Energy conservation    PT Next Visit Plan continue with forearm crutches with gait for decreased UE support with gait, otherwise use single UE support on bar/counter. Continue activities to promote more left hip/knee flexion, continue to work on static standing balance with no UE support and hip/core strengthening in standing so not to aggrivate knee    Consulted and Agree with Plan of Care Patient           Patient will benefit from skilled therapeutic intervention in order to improve the following deficits and impairments:  Abnormal gait, Decreased balance, Decreased endurance, Decreased coordination, Decreased range of motion, Decreased strength, Difficulty walking, Impaired tone,  Postural dysfunction, Decreased activity tolerance, Decreased mobility  Visit Diagnosis: Unsteadiness on feet  Difficulty in walking, not elsewhere classified  Other abnormalities of gait and mobility  Foot drop, right  Foot drop, left     Problem List Patient Active Problem List   Diagnosis Date Noted  . Gait disturbance 08/19/2019  . Acquired diplegia ([place]) 08/19/2019  . Vitamin D insufficiency 11/29/2016  . Hypogonadism in male 11/01/2016  . 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[place])   . Hypothyroidism   . Hypertension   . Hyperlipidemia   . Closed fracture of right fibula and tibia 10/29/2016  . Closed tibia fracture 10/29/2016  . Closed fracture of right tibial plafond with fibula involvement 10/29/2016  . Abnormal liver function tests 03/06/2013  . RASH AND OTHER NONSPECIFIC SKIN ERUPTION 10/07/2009  . SHOULDER PAIN, LEFT 12/27/2008  . Type I (juvenile type) diabetes mellitus without mention of complication, uncontrolled 12/03/2007  . HYPOTHYROIDISM 11/12/2007  . HYPERCHOLESTEROLEMIA 11/12/2007  . MULTIPLE SCLEROSIS 08/29/2007  . PROLIFERATIVE DIABETIC RETINOPATHY 08/29/2007  . HYPERTENSION 12/25/2006    Willow Ora, PTA, [facility] [address], [place], [place] [address] [phone] 12/18/19, 2:58 PM   Name: Johnathan Ross MRN: [id number] Date of Birth: [birth year]

## 2019-12-22 ENCOUNTER — Ambulatory Visit: Payer: Medicare Other | Admitting: Physical Therapy

## 2019-12-22 ENCOUNTER — Other Ambulatory Visit: Payer: Self-pay

## 2019-12-22 ENCOUNTER — Encounter: Payer: Self-pay | Admitting: Physical Therapy

## 2019-12-22 DIAGNOSIS — M21372 Foot drop, left foot: Secondary | ICD-10-CM

## 2019-12-22 DIAGNOSIS — M6281 Muscle weakness (generalized): Secondary | ICD-10-CM | POA: Diagnosis not present

## 2019-12-22 DIAGNOSIS — R262 Difficulty in walking, not elsewhere classified: Secondary | ICD-10-CM

## 2019-12-22 DIAGNOSIS — M21371 Foot drop, right foot: Secondary | ICD-10-CM

## 2019-12-22 DIAGNOSIS — R2689 Other abnormalities of gait and mobility: Secondary | ICD-10-CM

## 2019-12-22 DIAGNOSIS — R2681 Unsteadiness on feet: Secondary | ICD-10-CM

## 2019-12-22 NOTE — Therapy (Signed)
[facility] [address] [place], [place], [address] Phone: [phone]   Fax:  [phone]  Physical Therapy Treatment  Patient Details  Name: Johnathan Ross MRN: [id number] Date of Birth: Apr 07, 1971 Referring Provider (PT): Carylon Perches, MD   Encounter Date: 12/22/2019   PT End of Session - 12/22/19 0851    Visit Number 55    Number of Visits 64    Date for PT Re-Evaluation 01/02/20    Authorization Type Medicare Part A and B; 10th visit PN    Progress Note Due on Visit 42    PT Start Time 0846    PT Stop Time 0930    PT Time Calculation (min) 44 min    Equipment Utilized During Treatment Gait belt    Activity Tolerance Patient tolerated treatment well;Patient limited by fatigue    Behavior During Therapy [facility] for tasks assessed/performed           Past Medical History:  Diagnosis Date  . Hyperlipidemia   . Hypertension   . Hypogonadism in male 11/01/2016  . Hypothyroidism   . Multiple sclerosis ([place])   . Proliferative diabetic retinopathy(362.02)   . Type 1 diabetes mellitus ([place]) dx'd 1994  . Vitamin D insufficiency 11/29/2016    Past Surgical History:  Procedure Laterality Date  . EYE SURGERY Bilateral    "laser for diabetic retinopathy"  . FRACTURE SURGERY    . OPEN REDUCTION INTERNAL FIXATION (ORIF) TIBIA/FIBULA FRACTURE Right 10/30/2016  . ORIF ANKLE FRACTURE Left 2015  . ORIF TIBIA FRACTURE Right 10/30/2016   Procedure: OPEN REDUCTION INTERNAL FIXATION (ORIF) TIBIA FIBULA FRACTURE;  Surgeon: Altamese Minnewaukan, MD;  Location: [facility];  Service: Orthopedics;  Laterality: Right;  . RETINAL LASER PROCEDURE Bilateral    "for diabetic retinopathy"    There were no vitals filed for this visit.   Subjective Assessment - 12/22/19 0850    Subjective No new complaints. No falls or pain to report.    Patient is accompained by: Family member   mom in car   Pertinent History multiple falls, vitamin D  insufficiency, Type 1 IDDM, diabetic retinopathy, hypothyroidism, HTN, hyperlipidemia, R tibia fracture, L ankle fracture with ORIF    Diagnostic tests No new lesions seen on MRI    Patient Stated Goals To improve walking    Currently in Pain? No/denies    Pain Score 0-No pain                  [facility] Adult PT Treatment/Exercise - 12/22/19 4259      Transfers   Transfers Sit to Stand;Stand to Sit    Sit to Stand 5: Supervision;With upper extremity assist;From bed;From chair/3-in-1    Stand to Sit 5: Supervision;With upper extremity assist;To bed;To chair/3-in-1      Ambulation/Gait   Ambulation/Gait Yes    Ambulation/Gait Assistance 4: Min guard;4: Min assist    Ambulation/Gait Assistance Details supervision to Mod I to walk into/out of gym with RW. use of forearm crutches in session with min guard to min assist for balance. cues on posture. no stops needed, a few "pauses to reposition LE or crutch due to mis step or mis placement.     Ambulation Distance (Feet) 115 Feet   x1   Assistive device Lofstrands    Gait Pattern Step-through pattern;Poor foot clearance - left;Left genu recurvatum;Decreased hip/knee flexion - right;Decreased hip/knee flexion - left;Decreased dorsiflexion - right;Poor foot clearance - right;Narrow base of support  Ambulation Surface Level;Indoor      Neuro Re-ed    Neuro Re-ed Details  for strengthening/muscle re-ed: tall kneeling on red mat: with 2# weighted ball- mini squats while holding the ball to chest for 10 reps, cues for full upright posture. then in tall kneeling lifting ball up over one shoulder<>down diagonally toward opposite knee/floor for 10 reps each way with up to min assist for balance due to anterior balance loss at times; then with green band resistance at pelvis/hip pulling in lateral direction (done with both sides)- alternating UE raises for 10-12 reps each arm, then with arms at sides head movements left<>right, up<>down with  intermittent touch to mat for balance assist with both head movements          Knee/Hip Exercises: Prone   Hamstring Curl 2 sets;5 reps;Limitations    Hamstring Curl Limitations yellow band resistance with left LE for 2 reps, then no resistance with AA for rest of the reps due to tone on bil LE's    Other Prone Exercises passive quad stretch for 1-2 minutes each LE.                    PT Short Term Goals - 12/04/19 1433      PT SHORT TERM GOAL #1   Title Patient will demonstrate independence with updated HEP    Baseline 12/02/19: met with current program which pt reports is still challenging    Status Achieved    Target Date 12/03/19      PT SHORT TERM GOAL #2   Title Pt will improve gait velocity with RW to >/= 0.8 ft/sec    Baseline 12/02/19: 0.64 ft/sec with RW, improved just not to goal with pt ambulating "his way" with LE's locked out    Status Partially Met    Target Date 12/03/19      PT SHORT TERM GOAL #3   Title Pt will improve BERG to >/= 42/56    Baseline 12/02/19: 41/56 scored today, improved from 38/56, just not to goal    Status Partially Met    Target Date 12/03/19      PT SHORT TERM GOAL #4   Title Pt will ambulate x 230' over flat, indoor surfaces with trekking poles with min A and intermittent standing rest breaks.    Baseline 12/04/19: min assist of 2 for up to 40 feet today. Has gone as far as 100 feet with 2 person assist in prior session.    Time --    Period --    Status Partially Met    Target Date 12/03/19      PT SHORT TERM GOAL #5   Title Pt will negotate 8 stairs with two rails step alternating sequence with supervision    Baseline 12/04/19: met in session today    Time --    Period --    Status Achieved    Target Date 12/03/19             PT Long Term Goals - 10/04/19 1811      PT LONG TERM GOAL #1   Title Pt will demonstrate independence with land HEP    Time 12    Period Weeks    Status Revised    Target Date 01/02/20      PT  LONG TERM GOAL #2   Title Pt will improve gait velocity with cane to >/= 0.8 ft/sec    Baseline Not assessed with cane  Time 12    Period Weeks    Status Revised    Target Date 01/02/20      PT LONG TERM GOAL #3   Title Pt will improve BERG to >/= 46/56 to decrease falls risk    Time 12    Period Weeks    Status Revised    Target Date 01/02/20      PT LONG TERM GOAL #4   Title Pt will ambulate x 200' outside over pavement and grass with supervision and cane to improve safety ambulating to wood working shop at home    Time 12    Period Weeks    Status Revised    Target Date 01/02/20      PT LONG TERM GOAL #5   Title Pt will negotiate 8 stairs with one rail and cane MOD I for safe home entry/exit with cane    Time 12    Period Weeks    Status Revised    Target Date 01/02/20                 Plan - 12/22/19 0851    Clinical Impression Statement Today's skilled session continued to focus on LE strengthing/NMR with emphasis on hip/knee flexion with rest breaks taken as needed. Continud to work on use of bil forearm crutches with gait with decreased assistance needed today vs with previous session.    Comorbidities multiple falls, vitamin D insufficiency, Type 1 IDDM, diabetic retinopathy, hypothyroidism, HTN, hyperlipidemia, R tibia fracture, L ankle fracture with ORIF    Rehab Potential Good    PT Frequency 2x / week    PT Duration 12 weeks    PT Treatment/Interventions ADLs/Self Care Home Management;Aquatic Therapy;Electrical Stimulation;DME Instruction;Gait training;Stair training;Functional mobility training;Therapeutic activities;Therapeutic exercise;Balance training;Neuromuscular re-education;Patient/family education;Orthotic Fit/Training;Passive range of motion;Energy conservation    PT Next Visit Plan continue with forearm crutches with gait for decreased UE support with gait, otherwise use single UE support on bar/counter. Continue activities to promote more left  hip/knee flexion, continue to work on static standing balance with no UE support and hip/core strengthening in standing so not to aggrivate knee    Consulted and Agree with Plan of Care Patient           Patient will benefit from skilled therapeutic intervention in order to improve the following deficits and impairments:  Abnormal gait, Decreased balance, Decreased endurance, Decreased coordination, Decreased range of motion, Decreased strength, Difficulty walking, Impaired tone, Postural dysfunction, Decreased activity tolerance, Decreased mobility  Visit Diagnosis: Unsteadiness on feet  Difficulty in walking, not elsewhere classified  Other abnormalities of gait and mobility  Foot drop, right  Foot drop, left  Muscle weakness (generalized)     Problem List Patient Active Problem List   Diagnosis Date Noted  . Gait disturbance 08/19/2019  . Acquired diplegia ([place]) 08/19/2019  . Vitamin D insufficiency 11/29/2016  . Hypogonadism in male 11/01/2016  . 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 Hypothyroidism   . Hypertension   . Hyperlipidemia   . Closed fracture of right fibula and tibia 10/29/2016  . Closed tibia fracture 10/29/2016  . Closed fracture of right tibial plafond with fibula involvement 10/29/2016  . Abnormal liver function tests 03/06/2013  . RASH AND OTHER NONSPECIFIC SKIN ERUPTION 10/07/2009  . SHOULDER PAIN, LEFT 12/27/2008  . Type I (juvenile type) diabetes mellitus without mention of complication, uncontrolled 12/03/2007  . HYPOTHYROIDISM 11/12/2007  . HYPERCHOLESTEROLEMIA 11/12/2007  . MULTIPLE SCLEROSIS 08/29/2007  .  PROLIFERATIVE DIABETIC RETINOPATHY 08/29/2007  . HYPERTENSION 12/25/2006    Willow Ora, PTA, [facility] [address], [place], [place] [address] [phone] 12/22/19, 1:39 PM   Name: Johnathan Ross MRN: [id number] Date of Birth: [birth year]

## 2019-12-23 ENCOUNTER — Ambulatory Visit (INDEPENDENT_AMBULATORY_CARE_PROVIDER_SITE_OTHER): Payer: Medicare Other | Admitting: Neurology

## 2019-12-23 ENCOUNTER — Encounter: Payer: Self-pay | Admitting: Neurology

## 2019-12-23 ENCOUNTER — Other Ambulatory Visit: Payer: Self-pay | Admitting: *Deleted

## 2019-12-23 VITALS — BP 120/73 | HR 81 | Ht 73.0 in | Wt 198.0 lb

## 2019-12-23 DIAGNOSIS — R269 Unspecified abnormalities of gait and mobility: Secondary | ICD-10-CM

## 2019-12-23 DIAGNOSIS — G35 Multiple sclerosis: Secondary | ICD-10-CM

## 2019-12-23 DIAGNOSIS — G830 Diplegia of upper limbs: Secondary | ICD-10-CM | POA: Diagnosis not present

## 2019-12-23 DIAGNOSIS — E1042 Type 1 diabetes mellitus with diabetic polyneuropathy: Secondary | ICD-10-CM | POA: Diagnosis not present

## 2019-12-23 MED ORDER — DALFAMPRIDINE ER 10 MG PO TB12: 10.0000 mg | ORAL_TABLET | Freq: Two times a day (BID) | ORAL | 3 refills | Status: DC

## 2019-12-23 MED ORDER — BACLOFEN 10 MG PO TABS: ORAL_TABLET | ORAL | 11 refills | Status: DC

## 2019-12-23 NOTE — Progress Notes (Signed)
[facility]  PATIENT: Johnathan Ross DOB: 11/11/1970  REFERRING DOCTOR OR PCP: Johnathan Mourning, FNP SOURCE: Patient, notes from Johnathan Ross (neurology), multiple MRI reports and images reviewed, lab reports.  _________________________________   HISTORICAL  CHIEF COMPLAINT:  Chief Complaint  Patient presents with  . Follow-up    RM 12. Last seen 08/19/2019.   . Multiple Sclerosis    On lemtrada- no long under Johnathan Ross care in [place], [place]  . Gait Problem    Uses walker    HISTORY OF PRESENT ILLNESS:  Johnathan Ross is a 49 year old man who was diagnosed with relapsing remitting MS around 2007.     Update 12/23/2019: He had his 2 years of Lemtrada therapy in 2016 and 2017.  He tolerated well.  He denies any exacerbation or new neurologic symptoms,.  However, his spasticity is worse.    He has had difficulties with weakness in his legs, spasticity and gait disturbance.   He takes baclofen 10 mg up to three pills a day.  He often feels sleepy but he does not note any correlation with his baclofen use.   He also takes dalfampridine 10 mg po bid.   He thinks it has helped.  He uses a walker mostly but a chair for long distances.  Initially, the right leg was worse but both legs are weak now. Both legs spasm up.  He had a single series of Botox injections in November 2020 but felt weaker without any benefit of his spasticity.  He is now needing to use the walker only but was using a cane some.  Spasticity is worse when he sits a while and then has to stretch after he stands up.    He uses the walker to walk mostly by lifting with his arms.      He notes mild numbness in his feet.  There is no dysesthetic pain in the feet.  He notes some urinary urgency.  He denies difficulties with his vision.  He reports some fatigue.  He sleeps well some nights but other nights wakes up.  He denies much difficulties with cognition.  He denies mood disturbance.   MS history:    In 2007, he presented with spasms in his left leg.   He saw Johnathan Ross in [place] and was started Rebif and had some progression and also had difficulty tolerating it.    He went on [place] in 2016 and had his second year in 2017.        Imaging studies: MRIs of the brain and cervical spine from 07/27/2019:  The MRI of the spine shows several T2 hyperintense foci, located posterior laterally bilaterally, right larger than left adjacent to C2-C3, laterally to the left at C3-C4, centrally at C4-C5 also associated with spinal stenosis.  The MRI of the brain shows multiple T2/FLAIR hyperintense foci in the periventricular and juxtacortical white matter and a couple punctate foci in the cerebellum.  None of the foci appear to be acute.  Other medical history: He has type 1 diabetes mellitus and is on insulin.  He also has hypothyroidism and is on Synthroid.  He has well-controlled hypertension.  REVIEW OF SYSTEMS: Constitutional: No fevers, chills, sweats, or change in appetite.  He notes some fatigue. Eyes: No visual changes, double vision, eye pain.  He has had laser eye surgery Ear, nose and throat: No hearing loss, ear pain, nasal congestion, sore throat Cardiovascular: No chest pain, palpitations Respiratory: No shortness of breath  at rest or with exertion.   No wheezes GastrointestinaI: No nausea, vomiting, diarrhea, abdominal pain, fecal incontinence Genitourinary: No dysuria, urinary retention or frequency.  No nocturia. Musculoskeletal: No neck pain, back pain Integumentary: No rash, pruritus, skin lesions Neurological: as above Psychiatric: No depression at this time.  No anxiety Endocrine: No palpitations, diaphoresis, change in appetite, change in weigh or increased thirst Hematologic/Lymphatic: No anemia, purpura, petechiae. Allergic/Immunologic: No itchy/runny eyes, nasal congestion, recent allergic reactions, rashes  ALLERGIES: No Known Allergies  HOME  MEDICATIONS:  Current Outpatient Medications:  .  baclofen (LIORESAL) 10 MG tablet, Take one or two pills three times a day (up to 6 pills/day), Disp: 180 each, Rfl: 11 .  dalfampridine (AMPYRA) 10 MG TB12, Take 1 tablet (10 mg total) by mouth 2 (two) times daily., Disp: 180 tablet, Rfl: 3 .  glucose blood (CONTOUR NEXT TEST) test strip, USE AS INSTRUCTED TO CHECK BLOOD SUGARS 8 TIMES DAILY, Disp: 300 strip, Rfl: 3 .  insulin aspart (NOVOLOG) 100 UNIT/ML injection, USE UP TO 80 UNITS IN INSULIN PUMP DAILY-DX CODE E10.8, Disp: 80 mL, Rfl: 2 .  lisinopril (ZESTRIL) 10 MG tablet, Take 1 tablet (10 mg total) by mouth daily., Disp: 90 tablet, Rfl: 0 .  simvastatin (ZOCOR) 20 MG tablet, Take 1 tablet (20 mg total) by mouth at bedtime., Disp: 90 tablet, Rfl: 0 .  SYNTHROID 125 MCG tablet, Take 1 tablet by mouth 6 days per week., Disp: 90 tablet, Rfl: 0  PAST MEDICAL HISTORY: Past Medical History:  Diagnosis Date  . Hyperlipidemia   . Hypertension   . Hypogonadism in male 11/01/2016  . Hypothyroidism   . Multiple sclerosis ([place])   . Proliferative diabetic retinopathy(362.02)   . Type 1 diabetes mellitus ([place]) dx'd 1994  . Vitamin D insufficiency 11/29/2016    PAST SURGICAL HISTORY: Past Surgical History:  Procedure Laterality Date  . EYE SURGERY Bilateral    "laser for diabetic retinopathy"  . FRACTURE SURGERY    . OPEN REDUCTION INTERNAL FIXATION (ORIF) TIBIA/FIBULA FRACTURE Right 10/30/2016  . ORIF ANKLE FRACTURE Left 2015  . ORIF TIBIA FRACTURE Right 10/30/2016   Procedure: OPEN REDUCTION INTERNAL FIXATION (ORIF) TIBIA FIBULA FRACTURE;  Surgeon: Altamese Brewerton, MD;  Location: [facility];  Service: Orthopedics;  Laterality: Right;  . RETINAL LASER PROCEDURE Bilateral    "for diabetic retinopathy"    FAMILY HISTORY: Family History  Problem Relation Age of Onset  . Diabetes Sister   . Colon cancer Maternal Grandmother   . Colon polyps Maternal Uncle     SOCIAL HISTORY:  Social History    Socioeconomic History  . Marital status: Single    Spouse name: 12  . Number of children: 1  . Years of education: Not on file  . Highest education level: Not on file  Occupational History  . Occupation: unemployed  Tobacco Use  . Smoking status: Never Smoker  . Smokeless tobacco: Never Used  Vaping Use  . Vaping Use: Never used  Substance and Sexual Activity  . Alcohol use: No  . Drug use: No  . Sexual activity: Not on file  Other Topics Concern  . Not on file  Social History Narrative   Right handed   No caffeine    Lives with girlfriend   Social Determinants of Health   Financial Resource Strain:   . Difficulty of Paying Living Expenses:   Food Insecurity:   . Worried About Charity fundraiser in the Last Year:   .  Ran Out of Food in the Last Year:   Transportation Needs:   . Film/video editor (Medical):   Marland Kitchen Lack of Transportation (Non-Medical):   Physical Activity:   . Days of Exercise per Week:   . Minutes of Exercise per Session:   Stress:   . Feeling of Stress :   Social Connections:   . Frequency of Communication with Friends and Family:   . Frequency of Social Gatherings with Friends and Family:   . Attends Religious Services:   . Active Member of Clubs or Organizations:   . Attends Archivist Meetings:   Marland Kitchen Marital Status:   Intimate Partner Violence:   . Fear of Current or Ex-Partner:   . Emotionally Abused:   Marland Kitchen Physically Abused:   . Sexually Abused:      PHYSICAL EXAM  Vitals:   12/23/19 0828  BP: 120/73  Pulse: 81  Weight: 198 lb (89.8 kg)  Height: 6\' 1"  (1.854 m)    Body mass index is 26.12 kg/m.  No exam data present  General: The patient is well-developed and well-nourished and in no acute distress   Skin: Extremities are without rash or  edema.  Neurologic Exam  Mental status: The patient is alert and oriented x 3 at the time of the examination. The patient has apparent normal recent and remote memory, with  an apparently normal attention span and concentration ability.   Speech is normal.  Cranial nerves: Extraocular movements are full   Color vision was symmetric.  Facial symmetry is present.  Facial strength was normal.  Trapezius strength was normal no obvious hearing deficits are noted.  Motor:  Muscle bulk is normal.   Muscle tone is increased in the legs.  Strength is 5/5 in the arms, 4 - in the hip flexors, 4++ in the leg extenders, 4 in the gastrocnemius muscles and 4 - with ankle extensors  Sensory: Sensory testing is intact to pinprick, soft touch and vibration sensation in the arms but he has reduced vibration sensation and reduced touch sensation in the feet vibration sensation was just mildly reduced at the ankle.  Coordination: Cerebellar testing reveals good finger-nose-finger and heel-to-shin bilaterally.  Gait and station: He can rise up from a chair using his arms to support.  He uses a walker but relies on his arm strength and then swings his legs as he walks.  Reflexes: Deep tendon reflexes are normal in the arms and knees and absent at the toes.    DIAGNOSTIC DATA (LABS, IMAGING, TESTING) - I reviewed patient records, labs, notes, testing and imaging myself where available.  Lab Results  Component Value Date   WBC 7.3 07/28/2019   HGB 15.6 07/28/2019   HCT 48 07/28/2019   MCV 86.8 02/21/2019   PLT 278 02/21/2019      Component Value Date/Time   NA 134 (L) 12/03/2019 1021   K 4.6 12/03/2019 1021   CL 98 12/03/2019 1021   CO2 28 12/03/2019 1021   GLUCOSE 241 (H) 12/03/2019 1021   GLUCOSE 147 (H) 05/24/2006 1022   BUN 20 12/03/2019 1021   CREATININE 1.20 12/03/2019 1021   CALCIUM 9.4 12/03/2019 1021   CALCIUM 8.7 10/31/2016 0415   PROT 7.0 08/11/2019 0944   ALBUMIN 4.2 08/11/2019 0944   AST 24 08/11/2019 0944   ALT 23 08/11/2019 0944   ALKPHOS 81 08/11/2019 0944   BILITOT 0.5 08/11/2019 0944   GFRNONAA 75 07/28/2019 0000   GFRAA 87 07/28/2019 0000  Lab  Results  Component Value Date   CHOL 134 08/11/2019   HDL 46.00 08/11/2019   LDLCALC 81 08/11/2019   LDLDIRECT 139.7 04/09/2014   TRIG 38.0 08/11/2019   CHOLHDL 3 08/11/2019   Lab Results  Component Value Date   HGBA1C 8.0 (H) 12/03/2019   No results found for: VITAMINB12 Lab Results  Component Value Date   TSH 1.41 12/03/2019       ASSESSMENT AND PLAN  Multiple sclerosis ([place])  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[facility])  Gait disturbance  Acquired diplegia ([place])  1.  His MS appears to be stable since doing 2 years of [place] in 2016 and 2017.  His last MRI (07/27/2019) showed no new lesions. 2.   Continue baclofen.  He can take up to 6 pills a day to see if the higher dose has more benefit. 3.   He will return to see [place] in 6 months or sooner for new or worsening neurologic symptoms.  Curt Oatis A. Felecia Shelling, MD, [facility] 02/19/9323, 1:99 PM Certified in Neurology, Clinical Neurophysiology, Sleep Medicine and Neuroimaging  [facility] [address], [place], [place] [address] [phone]

## 2019-12-25 ENCOUNTER — Other Ambulatory Visit: Payer: Self-pay

## 2019-12-25 ENCOUNTER — Encounter: Payer: Self-pay | Admitting: Physical Therapy

## 2019-12-25 ENCOUNTER — Ambulatory Visit: Payer: Medicare Other | Admitting: Physical Therapy

## 2019-12-25 DIAGNOSIS — R296 Repeated falls: Secondary | ICD-10-CM

## 2019-12-25 DIAGNOSIS — M21371 Foot drop, right foot: Secondary | ICD-10-CM

## 2019-12-25 DIAGNOSIS — R262 Difficulty in walking, not elsewhere classified: Secondary | ICD-10-CM

## 2019-12-25 DIAGNOSIS — M21372 Foot drop, left foot: Secondary | ICD-10-CM

## 2019-12-25 DIAGNOSIS — R2681 Unsteadiness on feet: Secondary | ICD-10-CM

## 2019-12-25 DIAGNOSIS — R2689 Other abnormalities of gait and mobility: Secondary | ICD-10-CM | POA: Diagnosis not present

## 2019-12-25 DIAGNOSIS — M6281 Muscle weakness (generalized): Secondary | ICD-10-CM

## 2019-12-25 NOTE — Therapy (Signed)
[facility] [address] [place], [place], [address] Phone: [phone]   Fax:  [phone]  Physical Therapy Treatment  Patient Details  Name: Johnathan Ross MRN: [id number] Date of Birth: 11-05-1970 Referring Provider (PT): Carylon Perches, MD   Encounter Date: 12/25/2019   PT End of Session - 12/25/19 1259    Visit Number 73    Number of Visits 18    Date for PT Re-Evaluation 01/02/20    Authorization Type Medicare Part A and B; 10th visit PN    Progress Note Due on Visit 60    PT Start Time 0850    PT Stop Time 0940    PT Time Calculation (min) 50 min    Equipment Utilized During Treatment Other (comment)   loftstrands   Activity Tolerance Patient tolerated treatment well    Behavior During Therapy [facility] for tasks assessed/performed           Past Medical History:  Diagnosis Date  . Hyperlipidemia   . Hypertension   . Hypogonadism in male 11/01/2016  . Hypothyroidism   . Multiple sclerosis ([place])   . Proliferative diabetic retinopathy(362.02)   . Type 1 diabetes mellitus ([place]) dx'd 1994  . Vitamin D insufficiency 11/29/2016    Past Surgical History:  Procedure Laterality Date  . EYE SURGERY Bilateral    "laser for diabetic retinopathy"  . FRACTURE SURGERY    . OPEN REDUCTION INTERNAL FIXATION (ORIF) TIBIA/FIBULA FRACTURE Right 10/30/2016  . ORIF ANKLE FRACTURE Left 2015  . ORIF TIBIA FRACTURE Right 10/30/2016   Procedure: OPEN REDUCTION INTERNAL FIXATION (ORIF) TIBIA FIBULA FRACTURE;  Surgeon: Altamese Kaka, MD;  Location: [facility];  Service: Orthopedics;  Laterality: Right;  . RETINAL LASER PROCEDURE Bilateral    "for diabetic retinopathy"    There were no vitals filed for this visit.   Subjective Assessment - 12/25/19 0856    Subjective Went to see Dr. Felecia Shelling who gave pt better explanation of spasticity and encouraged pt to continue to go up on baclofen to find the sweet spot.  Has been working with  forearm crutches and has been able to go further with them.  Looked at buying a pair but then didn't pursue it any further.    Patient is accompained by: Family member   mom in car   Pertinent History multiple falls, vitamin D insufficiency, Type 1 IDDM, diabetic retinopathy, hypothyroidism, HTN, hyperlipidemia, R tibia fracture, L ankle fracture with ORIF    Diagnostic tests No new lesions seen on MRI    Patient Stated Goals To improve walking    Currently in Pain? No/denies                             [facility] Adult PT Treatment/Exercise - 12/25/19 1243      Ambulation/Gait   Ambulation/Gait Yes    Ambulation/Gait Assistance 4: Min guard;4: Min assist    Ambulation/Gait Assistance Details continued gait training with Loftstrands with 4 point gait pattern and intermittent standing breaks as RLE fatigued.  Pt demonstrated improved placement of R foot (less adducted), decreased lateral trunk leaning until fatigued and then pt demonstrated slightly increased lateral lean to L instead of pelvic weight shift forwards and L to advance RLE.  Able to ambulate 3 laps without seated rest break today    Ambulation Distance (Feet) 345 Feet    Assistive device Lofstrands    Gait Pattern Step-through pattern;Decreased  step length - right;Decreased stride length;Decreased hip/knee flexion - right;Decreased dorsiflexion - right;Decreased weight shift to left;Right hip hike;Right genu recurvatum;Narrow base of support;Poor foot clearance - right    Ambulation Surface Level;Indoor      Therapeutic Activites    Therapeutic Activities Other Therapeutic Activities    Other Therapeutic Activities Continued to educate pt on nature of spasticity and how it has affected his progress with ambulation and how goal with medication is to reduce spasticity but not get rid of it completely since when it is taken away completely pt is too weak to ambulate safely.  Also educated pt on results of sensory testing,  decreased sensation in bilat feet and how that can affect balance and balance reactions.  Discussed patient's hesitancy to use Loftstrand's outside therapy - discussed benefits of using Loftstrands for posture, more normal gait sequence with UE swing.  Will continue to practice and discuss with patient.      Knee/Hip Exercises: Stretches   Sports administrator Right;Left;1 rep;60 seconds    Quad Stretch Limitations Prone passive quad stretch    Hip Flexor Stretch Right;Left;1 rep;60 seconds    Hip Flexor Stretch Limitations prone on elbows after performing quad stretch      Knee/Hip Exercises: Prone   Hamstring Curl 1 set;10 reps;5 seconds    Hamstring Curl Limitations with manual resistance from therapist    Hip Extension Strengthening;Right;Left;1 set;10 reps    Hip Extension Limitations with knee flexed after hamstring curls                  PT Education - 12/25/19 1259    Education Details see TA    Person(s) Educated Patient    Methods Explanation    Comprehension Verbalized understanding            PT Short Term Goals - 12/04/19 1433      PT SHORT TERM GOAL #1   Title Patient will demonstrate independence with updated HEP    Baseline 12/02/19: met with current program which pt reports is still challenging    Status Achieved    Target Date 12/03/19      PT SHORT TERM GOAL #2   Title Pt will improve gait velocity with RW to >/= 0.8 ft/sec    Baseline 12/02/19: 0.64 ft/sec with RW, improved just not to goal with pt ambulating "his way" with LE's locked out    Status Partially Met    Target Date 12/03/19      PT SHORT TERM GOAL #3   Title Pt will improve BERG to >/= 42/56    Baseline 12/02/19: 41/56 scored today, improved from 38/56, just not to goal    Status Partially Met    Target Date 12/03/19      PT SHORT TERM GOAL #4   Title Pt will ambulate x 230' over flat, indoor surfaces with trekking poles with min A and intermittent standing rest breaks.    Baseline 12/04/19:  min assist of 2 for up to 40 feet today. Has gone as far as 100 feet with 2 person assist in prior session.    Time --    Period --    Status Partially Met    Target Date 12/03/19      PT SHORT TERM GOAL #5   Title Pt will negotate 8 stairs with two rails step alternating sequence with supervision    Baseline 12/04/19: met in session today    Time --    Period --  Status Achieved    Target Date 12/03/19             PT Long Term Goals - 10/04/19 1811      PT LONG TERM GOAL #1   Title Pt will demonstrate independence with land HEP    Time 12    Period Weeks    Status Revised    Target Date 01/02/20      PT LONG TERM GOAL #2   Title Pt will improve gait velocity with cane to >/= 0.8 ft/sec    Baseline Not assessed with cane    Time 12    Period Weeks    Status Revised    Target Date 01/02/20      PT LONG TERM GOAL #3   Title Pt will improve BERG to >/= 46/56 to decrease falls risk    Time 12    Period Weeks    Status Revised    Target Date 01/02/20      PT LONG TERM GOAL #4   Title Pt will ambulate x 200' outside over pavement and grass with supervision and cane to improve safety ambulating to wood working shop at home    Time 12    Period Weeks    Status Revised    Target Date 01/02/20      PT LONG TERM GOAL #5   Title Pt will negotiate 8 stairs with one rail and cane MOD I for safe home entry/exit with cane    Time 12    Period Weeks    Status Revised    Target Date 01/02/20                 Plan - 12/25/19 1300    Clinical Impression Statement Continued to focus on bilat LE ROM and strengthening as pt gradually increases Baclofen dosage to assist with spasticity, gait training and endurance with use of Loftstrand crutches.  Pt able to progress to 3 laps around gym with minimal fatigue at end of 3rd lap resulting in increased trunk lateral lean.  Next session plan to use loftstrands over uneven surfaces and for stair negotiation to continue to allow pt  to decide if he would be willing to transition to loftstrands outside therapy.    Comorbidities multiple falls, vitamin D insufficiency, Type 1 IDDM, diabetic retinopathy, hypothyroidism, HTN, hyperlipidemia, R tibia fracture, L ankle fracture with ORIF    Rehab Potential Good    PT Frequency 2x / week    PT Duration 12 weeks    PT Treatment/Interventions ADLs/Self Care Home Management;Aquatic Therapy;Electrical Stimulation;DME Instruction;Gait training;Stair training;Functional mobility training;Therapeutic activities;Therapeutic exercise;Balance training;Neuromuscular re-education;Patient/family education;Orthotic Fit/Training;Passive range of motion;Energy conservation    PT Next Visit Plan check goals and recert by end of next week; how is he doing with increasing baclofen dosage?  loftstrand crutches outside, ramp, curb, stairs as able.  stretch gastroc/hamstring, hip flexor, strengthening hip and knee flexion, hip ABD    Consulted and Agree with Plan of Care Patient           Patient will benefit from skilled therapeutic intervention in order to improve the following deficits and impairments:  Abnormal gait, Decreased balance, Decreased endurance, Decreased coordination, Decreased range of motion, Decreased strength, Difficulty walking, Impaired tone, Postural dysfunction, Decreased activity tolerance, Decreased mobility  Visit Diagnosis: Unsteadiness on feet  Difficulty in walking, not elsewhere classified  Other abnormalities of gait and mobility  Foot drop, right  Foot drop, left  Muscle weakness (generalized)  Repeated falls     Problem List Patient Active Problem List   Diagnosis Date Noted  . Gait disturbance 08/19/2019  . Acquired diplegia ([place]) 08/19/2019  . Vitamin D insufficiency 11/29/2016  . Hypogonadism in male 11/01/2016  . 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[place])   . Hypothyroidism   . Hypertension   . Hyperlipidemia   . Closed  fracture of right fibula and tibia 10/29/2016  . Closed tibia fracture 10/29/2016  . Closed fracture of right tibial plafond with fibula involvement 10/29/2016  . Abnormal liver function tests 03/06/2013  . RASH AND OTHER NONSPECIFIC SKIN ERUPTION 10/07/2009  . SHOULDER PAIN, LEFT 12/27/2008  . Type I (juvenile type) diabetes mellitus without mention of complication, uncontrolled 12/03/2007  . HYPOTHYROIDISM 11/12/2007  . HYPERCHOLESTEROLEMIA 11/12/2007  . MULTIPLE SCLEROSIS 08/29/2007  . PROLIFERATIVE DIABETIC RETINOPATHY 08/29/2007  . HYPERTENSION 12/25/2006    [name], PT, DPT 12/25/19    1:08 PM    [facility] [address] [place], [place], [address] Phone: [phone]   Fax:  [phone]  Name: MEARL OLVER MRN: [id number] Date of Birth: 06-Sep-1970

## 2019-12-30 ENCOUNTER — Other Ambulatory Visit: Payer: Self-pay

## 2019-12-30 ENCOUNTER — Ambulatory Visit: Payer: Medicare Other | Attending: Physician Assistant | Admitting: Physical Therapy

## 2019-12-30 ENCOUNTER — Encounter: Payer: Self-pay | Admitting: Physical Therapy

## 2019-12-30 DIAGNOSIS — M21371 Foot drop, right foot: Secondary | ICD-10-CM

## 2019-12-30 DIAGNOSIS — R262 Difficulty in walking, not elsewhere classified: Secondary | ICD-10-CM

## 2019-12-30 DIAGNOSIS — R2689 Other abnormalities of gait and mobility: Secondary | ICD-10-CM

## 2019-12-30 DIAGNOSIS — R2681 Unsteadiness on feet: Secondary | ICD-10-CM

## 2019-12-30 DIAGNOSIS — R296 Repeated falls: Secondary | ICD-10-CM

## 2019-12-30 DIAGNOSIS — M21372 Foot drop, left foot: Secondary | ICD-10-CM | POA: Diagnosis not present

## 2019-12-30 DIAGNOSIS — M6281 Muscle weakness (generalized): Secondary | ICD-10-CM | POA: Diagnosis not present

## 2019-12-30 NOTE — Therapy (Signed)
[facility] [address] [place], [place], [address] Phone: [phone]   Fax:  [phone]  Physical Therapy Treatment  Patient Details  Name: Johnathan Ross MRN: [id number] Date of Birth: 18-Sep-1970 Referring Provider (PT): Carylon Perches, MD   Encounter Date: 12/30/2019   PT End of Session - 12/30/19 1044    Visit Number 54    Number of Visits 3    Date for PT Re-Evaluation 01/02/20    Authorization Type Medicare Part A and B; 10th visit PN    Progress Note Due on Visit 28    PT Start Time 0850    PT Stop Time 0940    PT Time Calculation (min) 50 min    Equipment Utilized During Treatment Other (comment)   loftstrands   Activity Tolerance Patient tolerated treatment well    Behavior During Therapy [facility] for tasks assessed/performed           Past Medical History:  Diagnosis Date  . Hyperlipidemia   . Hypertension   . Hypogonadism in male 11/01/2016  . Hypothyroidism   . Multiple sclerosis ([place])   . Proliferative diabetic retinopathy(362.02)   . Type 1 diabetes mellitus ([place]) dx'd 1994  . Vitamin D insufficiency 11/29/2016    Past Surgical History:  Procedure Laterality Date  . EYE SURGERY Bilateral    "laser for diabetic retinopathy"  . FRACTURE SURGERY    . OPEN REDUCTION INTERNAL FIXATION (ORIF) TIBIA/FIBULA FRACTURE Right 10/30/2016  . ORIF ANKLE FRACTURE Left 2015  . ORIF TIBIA FRACTURE Right 10/30/2016   Procedure: OPEN REDUCTION INTERNAL FIXATION (ORIF) TIBIA FIBULA FRACTURE;  Surgeon: Altamese Perry, MD;  Location: [facility];  Service: Orthopedics;  Laterality: Right;  . RETINAL LASER PROCEDURE Bilateral    "for diabetic retinopathy"    There were no vitals filed for this visit.   Subjective Assessment - 12/30/19 0854    Subjective No issues to report; has gone up on Baclofen, took two this morning, will try 2 at lunch time and 1.5 at night.    Patient is accompained by: Family member   mom in  car   Pertinent History multiple falls, vitamin D insufficiency, Type 1 IDDM, diabetic retinopathy, hypothyroidism, HTN, hyperlipidemia, R tibia fracture, L ankle fracture with ORIF    Diagnostic tests No new lesions seen on MRI    Patient Stated Goals To improve walking    Currently in Pain? No/denies                             [facility] Adult PT Treatment/Exercise - 12/30/19 0929      Transfers   Transfers Sit to Stand;Stand to Sit    Sit to Stand 4: Min assist    Sit to Stand Details (indicate cue type and reason) when performing sit <> stand with loftstrand crutches; required min A to maintain balance when placing UE into loftstrands    Stand to Sit 4: Min assist    Stand to Sit Details min A to stabilize and balance when taking UE out of loftstrands to prepare for sitting      Ambulation/Gait   Ambulation/Gait Yes    Ambulation/Gait Assistance 4: Min assist;3: Mod assist    Ambulation/Gait Assistance Details gait training with loftstrands on level surface; pt required increased assistance today due to increased weakness and decreased ability to perform hip and knee flexion to clear R or L foot; more rest  breaks today and assistance from therapist to prevent LOB anteriorly when toe catches.  After performing stair and ramp therapist assisted with weight shift and stance time on RLE and pt demonstrated improved LLE clearance and no toe drag.  No genu recurvatum noted today    Ambulation Distance (Feet) 115 Feet    Assistive device Lofstrands    Gait Pattern Step-through pattern;Decreased step length - right;Decreased stride length;Decreased hip/knee flexion - right;Decreased dorsiflexion - right;Narrow base of support;Poor foot clearance - right;Step-to pattern;Decreased step length - left;Decreased stance time - right;Decreased hip/knee flexion - left;Decreased dorsiflexion - left;Decreased weight shift to right;Poor foot clearance - left    Ambulation Surface Level;Indoor     Stairs Yes    Stairs Assistance 4: Min assist;3: Mod assist    Stairs Assistance Details (indicate cue type and reason) performed with loftstrand in one UE and rail on contralateral side.  Pt initially attempting to perform alternating sequence but unable to safely perform with loftstrands without 50% assistance from therapist for balance and safety.  Demonstrated step to sequence and had pt return demonstrate step to technique x 2 sets with improved balance but increased time to clear each LE due to inability to use momentum.  When descending pt required assistance to shift weight anteriorly to prevent posterior LOB    Stair Management Technique One rail Right;Alternating pattern;Step to pattern;Forwards;With crutches   with one loftstrand crutch, unable to hold both   Number of Stairs 12    Height of Stairs 6    Ramp 4: Min assist    Ramp Details (indicate cue type and reason) with loftstrand crutches; verbal cues for technique and sequencing; assistance for balance when ascending due to decreased foot clearance, improved weight shift and foot clearance when descending.  Performed x 2      Knee/Hip Exercises: Sidelying   Hip ABduction Strengthening;Right;Left;1 set;Other (comment)    Hip ABduction Limitations 8 reps each side open chain with 5 second hold with contralateral isometric pressing into mat                  PT Education - 12/30/19 1044    Education Details will continue to monitor response to increase in Baclofen, gait with loftstrands on inclines and stairs    Person(s) Educated Patient    Methods Explanation;Demonstration    Comprehension Verbalized understanding;Returned demonstration            PT Short Term Goals - 12/04/19 1433      PT SHORT TERM GOAL #1   Title Patient will demonstrate independence with updated HEP    Baseline 12/02/19: met with current program which pt reports is still challenging    Status Achieved    Target Date 12/03/19      PT SHORT  TERM GOAL #2   Title Pt will improve gait velocity with RW to >/= 0.8 ft/sec    Baseline 12/02/19: 0.64 ft/sec with RW, improved just not to goal with pt ambulating "his way" with LE's locked out    Status Partially Met    Target Date 12/03/19      PT SHORT TERM GOAL #3   Title Pt will improve BERG to >/= 42/56    Baseline 12/02/19: 41/56 scored today, improved from 38/56, just not to goal    Status Partially Met    Target Date 12/03/19      PT SHORT TERM GOAL #4   Title Pt will ambulate x 230' over flat, indoor surfaces with  trekking poles with min A and intermittent standing rest breaks.    Baseline 12/04/19: min assist of 2 for up to 40 feet today. Has gone as far as 100 feet with 2 person assist in prior session.    Time --    Period --    Status Partially Met    Target Date 12/03/19      PT SHORT TERM GOAL #5   Title Pt will negotate 8 stairs with two rails step alternating sequence with supervision    Baseline 12/04/19: met in session today    Time --    Period --    Status Achieved    Target Date 12/03/19             PT Long Term Goals - 10/04/19 1811      PT LONG TERM GOAL #1   Title Pt will demonstrate independence with land HEP    Time 12    Period Weeks    Status Revised    Target Date 01/02/20      PT LONG TERM GOAL #2   Title Pt will improve gait velocity with cane to >/= 0.8 ft/sec    Baseline Not assessed with cane    Time 12    Period Weeks    Status Revised    Target Date 01/02/20      PT LONG TERM GOAL #3   Title Pt will improve BERG to >/= 46/56 to decrease falls risk    Time 12    Period Weeks    Status Revised    Target Date 01/02/20      PT LONG TERM GOAL #4   Title Pt will ambulate x 200' outside over pavement and grass with supervision and cane to improve safety ambulating to wood working shop at home    Time 12    Period Weeks    Status Revised    Target Date 01/02/20      PT LONG TERM GOAL #5   Title Pt will negotiate 8 stairs with  one rail and cane MOD I for safe home entry/exit with cane    Time 12    Period Weeks    Status Revised    Target Date 01/02/20                 Plan - 12/30/19 1045    Clinical Impression Statement Pt demonstrating increased LE weakness today and decreased foot clearance requiring increased assistance with gait today.  Despite weakness pt able to participate in stair negotiation and ramp negotiation training with loftstrands with slightly improving gait sequence afterwards.  Continued to perform glute med strengthening with decreased muscular strength and endurance on RLE today.  Will continue to monitor if increased weakness is related to increasing baclofen or not.    Comorbidities multiple falls, vitamin D insufficiency, Type 1 IDDM, diabetic retinopathy, hypothyroidism, HTN, hyperlipidemia, R tibia fracture, L ankle fracture with ORIF    Rehab Potential Good    PT Frequency 2x / week    PT Duration 12 weeks    PT Treatment/Interventions ADLs/Self Care Home Management;Aquatic Therapy;Electrical Stimulation;DME Instruction;Gait training;Stair training;Functional mobility training;Therapeutic activities;Therapeutic exercise;Balance training;Neuromuscular re-education;Patient/family education;Orthotic Fit/Training;Passive range of motion;Energy conservation    PT Next Visit Plan change physician to Dr. Felecia Shelling; check goals and recert by end of next week; still having more weakness with increase in Baclofen?  loftstrand crutches outside, ramp, curb, stairs as able.  stretch gastroc/hamstring, hip flexor, strengthening hip and knee  flexion, hip ABD    Consulted and Agree with Plan of Care Patient           Patient will benefit from skilled therapeutic intervention in order to improve the following deficits and impairments:  Abnormal gait, Decreased balance, Decreased endurance, Decreased coordination, Decreased range of motion, Decreased strength, Difficulty walking, Impaired tone, Postural  dysfunction, Decreased activity tolerance, Decreased mobility  Visit Diagnosis: Unsteadiness on feet  Difficulty in walking, not elsewhere classified  Other abnormalities of gait and mobility  Foot drop, right  Foot drop, left  Muscle weakness (generalized)  Repeated falls     Problem List Patient Active Problem List   Diagnosis Date Noted  . Gait disturbance 08/19/2019  . Acquired diplegia ([place]) 08/19/2019  . Vitamin D insufficiency 11/29/2016  . Hypogonadism in male 11/01/2016  . 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 Hypothyroidism   . Hypertension   . Hyperlipidemia   . Closed fracture of right fibula and tibia 10/29/2016  . Closed tibia fracture 10/29/2016  . Closed fracture of right tibial plafond with fibula involvement 10/29/2016  . Abnormal liver function tests 03/06/2013  . RASH AND OTHER NONSPECIFIC SKIN ERUPTION 10/07/2009  . SHOULDER PAIN, LEFT 12/27/2008  . Type I (juvenile type) diabetes mellitus without mention of complication, uncontrolled 12/03/2007  . HYPOTHYROIDISM 11/12/2007  . HYPERCHOLESTEROLEMIA 11/12/2007  . MULTIPLE SCLEROSIS 08/29/2007  . PROLIFERATIVE DIABETIC RETINOPATHY 08/29/2007  . HYPERTENSION 12/25/2006    [name], PT, DPT 12/30/19    10:51 AM    [facility] [address] [place], [place], [address] Phone: [phone]   Fax:  [phone]  Name: KADON ANDRUS MRN: [id number] Date of Birth: April 21, 1971

## 2020-01-01 ENCOUNTER — Encounter: Payer: Self-pay | Admitting: Physical Therapy

## 2020-01-01 ENCOUNTER — Ambulatory Visit: Payer: Medicare Other | Admitting: Physical Therapy

## 2020-01-01 ENCOUNTER — Other Ambulatory Visit: Payer: Self-pay

## 2020-01-01 DIAGNOSIS — R2689 Other abnormalities of gait and mobility: Secondary | ICD-10-CM

## 2020-01-01 DIAGNOSIS — M6281 Muscle weakness (generalized): Secondary | ICD-10-CM | POA: Diagnosis not present

## 2020-01-01 DIAGNOSIS — M21371 Foot drop, right foot: Secondary | ICD-10-CM | POA: Diagnosis not present

## 2020-01-01 DIAGNOSIS — R2681 Unsteadiness on feet: Secondary | ICD-10-CM

## 2020-01-01 DIAGNOSIS — R296 Repeated falls: Secondary | ICD-10-CM

## 2020-01-01 DIAGNOSIS — M21372 Foot drop, left foot: Secondary | ICD-10-CM | POA: Diagnosis not present

## 2020-01-01 DIAGNOSIS — R262 Difficulty in walking, not elsewhere classified: Secondary | ICD-10-CM | POA: Diagnosis not present

## 2020-01-01 NOTE — Therapy (Signed)
[facility] [address] [place], [place], [address] Phone: [phone]   Fax:  [phone]  Physical Therapy Treatment  Patient Details  Name: Johnathan Ross MRN: [id number] Date of Birth: [birth year] Referring Provider (PT): Sater, Nanine Means, MD (previously Dr. Bjorn Loser but pt has changed MS care to [facility])   Encounter Date: 01/01/2020   PT End of Session - 01/01/20 0958    Visit Number 20    Number of Visits 90    Date for PT Re-Evaluation 01/02/20    Authorization Type Medicare Part A and B; 10th visit PN    Progress Note Due on Visit 14    PT Start Time 0850    PT Stop Time 0935    PT Time Calculation (min) 45 min    Equipment Utilized During Treatment Other (comment)   [email or web address]   Activity Tolerance Patient tolerated treatment well    Behavior During Therapy [facility] for tasks assessed/performed           Past Medical History:  Diagnosis Date   Hyperlipidemia    Hypertension    Hypogonadism in male 11/01/2016   Hypothyroidism    Multiple sclerosis ([place])    Proliferative diabetic retinopathy(362.02)    Type 1 diabetes mellitus ([place]) dx'd 1994   Vitamin D insufficiency 11/29/2016    Past Surgical History:  Procedure Laterality Date   EYE SURGERY Bilateral    "laser for diabetic retinopathy"   FRACTURE SURGERY     OPEN REDUCTION INTERNAL FIXATION (ORIF) TIBIA/FIBULA FRACTURE Right 10/30/2016   ORIF ANKLE FRACTURE Left 2015   ORIF TIBIA FRACTURE Right 10/30/2016   Procedure: OPEN REDUCTION INTERNAL FIXATION (ORIF) TIBIA FIBULA FRACTURE;  Surgeon: Altamese Marathon, MD;  Location: [place];  Service: Orthopedics;  Laterality: Right;   RETINAL LASER PROCEDURE Bilateral    "for diabetic retinopathy"    There were no vitals filed for this visit.   Subjective Assessment - 01/01/20 0908    Subjective Legs are feeling a little more weakness today; decided to not take Baclofen this morning before therapy; will  take when he gets home.  Took 2 last night.    Patient is accompained by: Family member   mom in car   Pertinent History multiple falls, vitamin D insufficiency, Type 1 IDDM, diabetic retinopathy, hypothyroidism, HTN, hyperlipidemia, R tibia fracture, L ankle fracture with ORIF    Diagnostic tests No new lesions seen on MRI    Patient Stated Goals To improve walking              [facility] PT Assessment - 01/01/20 0928      Assessment   Medical Diagnosis MS    Referring Provider (PT) Sater, Nanine Means, MD (previously Dr. Bjorn Loser but pt has changed MS care to [facility])    Onset Date/Surgical Date 12/15/18    Hand Dominance Right    Prior Therapy yes, outpatient PT following fall with ankle fracture      Precautions   Precautions Other (comment)    Precaution Comments multiple falls, vitamin D insufficiency, Type 1 IDDM, diabetic retinopathy, hypothyroidism, HTN, hyperlipidemia, R tibia fracture, L ankle fracture with ORIF    Required Braces or Orthoses Other Brace/Splint    Other Brace/Splint AFO RLE      Ambulation/Gait   Ambulation/Gait Yes    Ambulation/Gait Assistance 4: Min assist    Ambulation/Gait Assistance Details gait with HurryCane to assess current function, safety and falls risk.  Therapist provided min guard with  intermittent assistance to prevent fall if toe catches and pt experienced sudden LOB forwards or if RLE adducted during swing phase and pt experienced lateral LOB    Ambulation Distance (Feet) 200 Feet    Assistive device Straight cane    Gait Pattern Step-through pattern;Step-to pattern;Decreased step length - left;Decreased stance time - right;Decreased stride length;Decreased hip/knee flexion - right;Decreased dorsiflexion - right;Right genu recurvatum;Lateral hip instability;Narrow base of support;Poor foot clearance - right    Ambulation Surface Level;Indoor    Stairs Yes    Stairs Assistance 4: Min assist;3: Mod assist    Stairs Assistance Details (indicate cue type  and reason) educated pt on sequence with cane and one rail; cued to keep cane with RLE when performing ascending and descending with alternating sequence; assistance cues to shift weight forwards after each step to prevent genu recurvatum and posterior LOB    Stair Management Technique One rail Right;Alternating pattern;Forwards;With cane    Number of Stairs 8    Height of Stairs 6      Standardized Balance Assessment   10 Meter Walk 1.34 seconds with cane (after doing stairs) or .34 ft/sec                         [facility] Adult PT Treatment/Exercise - 01/01/20 0950      Therapeutic Activites    Therapeutic Activities Other Therapeutic Activities    Other Therapeutic Activities Discussed progress so far with patient, focus of treatment and patient's goals for therapy.  Pt's main goal is to walk more with the cane and less with rolling walker.  Pt is not open to using loftstrand crutches in the community or at home but is willing to use in therapy as a treatment tool.                    PT Education - 01/01/20 0958    Education Details see TA    Person(s) Educated Patient    Methods Explanation    Comprehension Verbalized understanding            PT Short Term Goals - 12/04/19 1433      PT SHORT TERM GOAL #1   Title Patient will demonstrate independence with updated HEP    Baseline 12/02/19: met with current program which pt reports is still challenging    Status Achieved    Target Date 12/03/19      PT SHORT TERM GOAL #2   Title Pt will improve gait velocity with RW to >/= 0.8 ft/sec    Baseline 12/02/19: 0.64 ft/sec with RW, improved just not to goal with pt ambulating "his way" with LE's locked out    Status Partially Met    Target Date 12/03/19      PT SHORT TERM GOAL #3   Title Pt will improve BERG to >/= 42/56    Baseline 12/02/19: 41/56 scored today, improved from 38/56, just not to goal    Status Partially Met    Target Date 12/03/19      PT SHORT  TERM GOAL #4   Title Pt will ambulate x 230' over flat, indoor surfaces with trekking poles with min A and intermittent standing rest breaks.    Baseline 12/04/19: min assist of 2 for up to 40 feet today. Has gone as far as 100 feet with 2 person assist in prior session.    Time --    Period --    Status  Partially Met    Target Date 12/03/19      PT SHORT TERM GOAL #5   Title Pt will negotate 8 stairs with two rails step alternating sequence with supervision    Baseline 12/04/19: met in session today    Time --    Period --    Status Achieved    Target Date 12/03/19             PT Long Term Goals - 01/01/20 0958      PT LONG TERM GOAL #1   Title Pt will demonstrate independence with land HEP    Time 12    Period Weeks    Status On-going      PT LONG TERM GOAL #2   Title Pt will improve gait velocity with cane to >/= 0.8 ft/sec    Baseline .34 ft/sec with cane    Time 12    Period Weeks    Status Not Met      PT LONG TERM GOAL #3   Title Pt will improve BERG to >/= 46/56 to decrease falls risk    Time 12    Period Weeks    Status On-going      PT LONG TERM GOAL #4   Title Pt will ambulate x 200' outside over pavement and grass with supervision and cane to improve safety ambulating to wood working shop at home    Time 12    Period Weeks    Status On-going      PT LONG TERM GOAL #5   Title Pt will negotiate 8 stairs with one rail and cane MOD I for safe home entry/exit with cane    Baseline one rail and cane, min-mod A    Time 12    Period Weeks    Status Not Met                 Plan - 01/01/20 0959    Clinical Impression Statement Initiated assessment of progress towards LTG.  Pt has been performing gait training with loftstrand crutches with improvement in gait safety and sequencing but pt's main goal remains to walk more with cane and less with RW.  Pt is not open to using loftstrands in community or at home currently.  Gait goals assessed with cane today;  pt is demonstrating progress with cane and was able to ambulate longer distances with cane today, perform stair negotiation and gait velocity without a rest break and with min A.  Will complete LTG assessment and will recertify for more visits next session.    Comorbidities multiple falls, vitamin D insufficiency, Type 1 IDDM, diabetic retinopathy, hypothyroidism, HTN, hyperlipidemia, R tibia fracture, L ankle fracture with ORIF    Rehab Potential Good    PT Frequency 2x / week    PT Duration 12 weeks    PT Treatment/Interventions ADLs/Self Care Home Management;Aquatic Therapy;Electrical Stimulation;DME Instruction;Gait training;Stair training;Functional mobility training;Therapeutic activities;Therapeutic exercise;Balance training;Neuromuscular re-education;Patient/family education;Orthotic Fit/Training;Passive range of motion;Energy conservation    PT Next Visit Plan Kathy-please finish LTG, BERG - just do items he has a hard time with, gait outside with cane if appropriate, HEP.  Send to me for recert.  he only wants to use loftstrand crutches as a treatment toold; still wants to work towards using cane - practice with gait outside, ramp, curb, stairs as able.  stretch gastroc/hamstring, hip flexor, strengthening hip and knee flexion, hip ABD    Consulted and Agree with Plan of Care Patient  Patient will benefit from skilled therapeutic intervention in order to improve the following deficits and impairments:  Abnormal gait, Decreased balance, Decreased endurance, Decreased coordination, Decreased range of motion, Decreased strength, Difficulty walking, Impaired tone, Postural dysfunction, Decreased activity tolerance, Decreased mobility  Visit Diagnosis: Unsteadiness on feet  Difficulty in walking, not elsewhere classified  Other abnormalities of gait and mobility  Foot drop, right  Foot drop, left  Muscle weakness (generalized)  Repeated falls     Problem List Patient  Active Problem List   Diagnosis Date Noted   Gait disturbance 08/19/2019   Acquired diplegia ([place]) 08/19/2019   Vitamin D insufficiency 11/29/2016   Hypogonadism in male 11/01/2016   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[place])    Hypothyroidism    Hypertension    Hyperlipidemia    Closed fracture of right fibula and tibia 10/29/2016   Closed tibia fracture 10/29/2016   Closed fracture of right tibial plafond with fibula involvement 10/29/2016   Abnormal liver function tests 03/06/2013   RASH AND OTHER NONSPECIFIC SKIN ERUPTION 10/07/2009   SHOULDER PAIN, LEFT 12/27/2008   Type I (juvenile type) diabetes mellitus without mention of complication, uncontrolled 12/03/2007   HYPOTHYROIDISM 11/12/2007   HYPERCHOLESTEROLEMIA 11/12/2007   MULTIPLE SCLEROSIS 08/29/2007   PROLIFERATIVE DIABETIC RETINOPATHY 08/29/2007   HYPERTENSION 12/25/2006    [name], PT, DPT 01/01/20    10:05 AM    [facility] [address] [place], [place], [address] Phone: [phone]   Fax:  [phone]  Name: MARLEN MOLLICA MRN: [id number] Date of Birth: 05-Dec-1970

## 2020-01-08 ENCOUNTER — Ambulatory Visit: Payer: Medicare Other | Admitting: Physical Therapy

## 2020-01-08 ENCOUNTER — Other Ambulatory Visit: Payer: Self-pay

## 2020-01-08 ENCOUNTER — Encounter: Payer: Self-pay | Admitting: Physical Therapy

## 2020-01-08 DIAGNOSIS — M6281 Muscle weakness (generalized): Secondary | ICD-10-CM

## 2020-01-08 DIAGNOSIS — M21372 Foot drop, left foot: Secondary | ICD-10-CM

## 2020-01-08 DIAGNOSIS — R2681 Unsteadiness on feet: Secondary | ICD-10-CM

## 2020-01-08 DIAGNOSIS — M21371 Foot drop, right foot: Secondary | ICD-10-CM | POA: Diagnosis not present

## 2020-01-08 DIAGNOSIS — R262 Difficulty in walking, not elsewhere classified: Secondary | ICD-10-CM

## 2020-01-08 DIAGNOSIS — R2689 Other abnormalities of gait and mobility: Secondary | ICD-10-CM

## 2020-01-08 DIAGNOSIS — R296 Repeated falls: Secondary | ICD-10-CM

## 2020-01-08 NOTE — Therapy (Addendum)
[facility] [address] [place], [place], [address] Phone: [phone]   Fax:  [phone]  Physical Therapy Treatment  Patient Details  Name: Johnathan Ross MRN: [id number] Date of Birth: August 27, 1970 Referring Provider (PT): Felecia Shelling, Nanine Means, MD (previously Dr. Bjorn Loser but pt has changed MS care to [facility])   Encounter Date: 01/08/2020    01/08/20 1048  PT Visits / Re-Eval  Visit Number 93  Number of Visits 3  Date for PT Re-Evaluation 01/02/20  Authorization  Authorization Type Medicare Part A and B; 10th visit PN  Progress Note Due on Visit 80  PT Time Calculation  PT Start Time 0845  PT Stop Time 0930  PT Time Calculation (min) 45 min  PT - End of Session  Activity Tolerance Patient tolerated treatment well  Behavior During Therapy [facility] for tasks assessed/performed    Past Medical History:  Diagnosis Date  . Hyperlipidemia   . Hypertension   . Hypogonadism in male 11/01/2016  . Hypothyroidism   . Multiple sclerosis ([place])   . Proliferative diabetic retinopathy(362.02)   . Type 1 diabetes mellitus ([place]) dx'd 1994  . Vitamin D insufficiency 11/29/2016    Past Surgical History:  Procedure Laterality Date  . EYE SURGERY Bilateral    "laser for diabetic retinopathy"  . FRACTURE SURGERY    . OPEN REDUCTION INTERNAL FIXATION (ORIF) TIBIA/FIBULA FRACTURE Right 10/30/2016  . ORIF ANKLE FRACTURE Left 2015  . ORIF TIBIA FRACTURE Right 10/30/2016   Procedure: OPEN REDUCTION INTERNAL FIXATION (ORIF) TIBIA FIBULA FRACTURE;  Surgeon: Altamese , MD;  Location: [place];  Service: Orthopedics;  Laterality: Right;  . RETINAL LASER PROCEDURE Bilateral    "for diabetic retinopathy"    There were no vitals filed for this visit.   Subjective Assessment - 01/08/20 0849    Subjective Took 2 baclofen this morning.  "Walking back here seemed a little bit better but every day is different."    Patient is accompained by: --    mom in car   Pertinent History multiple falls, vitamin D insufficiency, Type 1 IDDM, diabetic retinopathy, hypothyroidism, HTN, hyperlipidemia, R tibia fracture, L ankle fracture with ORIF    Diagnostic tests No new lesions seen on MRI    Patient Stated Goals To improve walking    Currently in Pain? No/denies              [facility] PT Assessment - 01/08/20 0001      Berg Balance Test   Sit to Stand Able to stand without using hands and stabilize independently    Standing Unsupported Able to stand safely 2 minutes    Sitting with Back Unsupported but Feet Supported on Floor or Stool Able to sit safely and securely 2 minutes    Stand to Sit Controls descent by using hands    Transfers Able to transfer safely, definite need of hands    Standing Unsupported with Eyes Closed Able to stand 10 seconds safely    Standing Unsupported with Feet Together Able to place feet together independently and stand for 1 minute with supervision    From Standing, Reach Forward with Outstretched Arm Can reach confidently >25 cm (10")    From Standing Position, Pick up Object from Floor Able to pick up shoe safely and easily    From Standing Position, Turn to Look Behind Over each Shoulder Looks behind one side only/other side shows less weight shift    Turn 360 Degrees Needs assistance while  turning    Standing Unsupported, Alternately Place Feet on Step/Stool Able to complete >2 steps/needs minimal assist    Standing Unsupported, One Foot in Front Able to take small step independently and hold 30 seconds    Standing on One Leg Unable to try or needs assist to prevent fall    Total Score 39    Berg comment: Pt having difficulty with knees "locking out" into hyperextension and difficulty getting them to flex which pt felt like was impacting his scores/ability today.                         [facility] Adult PT Treatment/Exercise - 01/08/20 0001      Transfers   Transfers Sit to Stand;Stand to Sit     Sit to Stand 5: Supervision    Stand to Sit 5: Supervision      Ambulation/Gait   Ambulation/Gait Yes    Ambulation/Gait Assistance 4: Min guard;5: Supervision    Ambulation Distance (Feet) 220 Feet   x1 and in/out clinic   Assistive device Lofstrands    Gait Pattern Step-through pattern;Step-to pattern;Decreased step length - left;Decreased stance time - right;Decreased stride length;Decreased hip/knee flexion - right;Decreased dorsiflexion - right;Right genu recurvatum;Lateral hip instability;Narrow base of support;Poor foot clearance - right   BOS improved with loftstrands   Ambulation Surface Level;Indoor    Gait Comments Pt's gait pattern improves with loftstrand as pt continues to vault self with UE's to clear LE's with RW      Standardized Balance Assessment   Standardized Balance Assessment --      Exercises   Exercises Other Exercises    Other Exercises  Pt verbally reviewed HEP and denies any questions with current HEP                    PT Short Term Goals - 12/04/19 1433      PT SHORT TERM GOAL #1   Title Patient will demonstrate independence with updated HEP    Baseline 12/02/19: met with current program which pt reports is still challenging    Status Achieved    Target Date 12/03/19      PT SHORT TERM GOAL #2   Title Pt will improve gait velocity with RW to >/= 0.8 ft/sec    Baseline 12/02/19: 0.64 ft/sec with RW, improved just not to goal with pt ambulating "his way" with LE's locked out    Status Partially Met    Target Date 12/03/19      PT SHORT TERM GOAL #3   Title Pt will improve BERG to >/= 42/56    Baseline 12/02/19: 41/56 scored today, improved from 38/56, just not to goal    Status Partially Met    Target Date 12/03/19      PT SHORT TERM GOAL #4   Title Pt will ambulate x 230' over flat, indoor surfaces with trekking poles with min A and intermittent standing rest breaks.    Baseline 12/04/19: min assist of 2 for up to 40 feet today. Has gone as  far as 100 feet with 2 person assist in prior session.    Time --    Period --    Status Partially Met    Target Date 12/03/19      PT SHORT TERM GOAL #5   Title Pt will negotate 8 stairs with two rails step alternating sequence with supervision    Baseline 12/04/19: met in session today  Time --    Period --    Status Achieved    Target Date 12/03/19             PT Long Term Goals - 01/08/20 1049      PT LONG TERM GOAL #1   Title Pt will demonstrate independence with land HEP    Time 12    Period Weeks    Status On-going      PT LONG TERM GOAL #2   Title Pt will improve gait velocity with cane to >/= 0.8 ft/sec    Baseline .34 ft/sec with cane    Time 12    Period Weeks    Status Not Met      PT LONG TERM GOAL #3   Title Pt will improve BERG to >/= 46/56 to decrease falls risk    Baseline 39/56 on 01/08/20    Time 12    Period Weeks    Status Not Met      PT LONG TERM GOAL #4   Title Pt will ambulate x 200' outside over pavement and grass with supervision and cane to improve safety ambulating to wood working shop at home    Time 12    Period Weeks    Status On-going      PT LONG TERM GOAL #5   Title Pt will negotiate 8 stairs with one rail and cane MOD I for safe home entry/exit with cane    Baseline one rail and cane, min-mod A    Time 12    Period Weeks    Status Not Met           New goals for re-cert entered by primary PT:  PT Short Term Goals - 01/14/20 2114      PT SHORT TERM GOAL #1   Title Patient will demonstrate independence with updated HEP    Time 4    Period Weeks    Status Revised    Target Date 02/05/20      PT SHORT TERM GOAL #2   Title Pt will improve gait velocity with cane to >/= 0.5 ft/sec    Time 4    Period Weeks    Status Revised    Target Date 02/05/20      PT SHORT TERM GOAL #3   Title Pt will improve BERG to >/= 42/56    Time 4    Period Weeks    Status Revised    Target Date 02/05/20      PT SHORT TERM GOAL #4    Title Pt will ambulate x 115' over flat, indoor surfaces with cane and with min A and intermittent standing rest breaks.    Time 4    Period Weeks    Status Revised    Target Date 02/05/20      PT SHORT TERM GOAL #5   Target Date 12/03/19           PT Long Term Goals - 01/14/20 2116      PT LONG TERM GOAL #1   Title Pt will demonstrate independence with land HEP    Time 8    Period Weeks    Status Revised    Target Date 03/08/20      PT LONG TERM GOAL #2   Title Pt will improve gait velocity with cane to >/= 0.8 ft/sec    Baseline .34 ft/sec with cane    Time 8    Period Weeks  Status Revised    Target Date 03/08/20      PT LONG TERM GOAL #3   Title Pt will improve BERG to >/= 45/56 to decrease falls risk    Baseline 39/56 on 01/08/20    Time 8    Period Weeks    Status Revised    Target Date 03/08/20      PT LONG TERM GOAL #4   Title Pt will ambulate x 200' outside over pavement and grass with supervision and cane to improve safety ambulating to wood working shop at home    Time 8    Period Weeks    Status Revised    Target Date 03/08/20                 Plan - 01/14/20 2109    Clinical Impression Statement LTG 3 not met as pt with decreased balance performing berg today due to difficulty overcoming knee hyperextension. Pt reports he is feeling more comfortable with loftstrands but would still like goal to be gait with cane. Primary PT to recertify for additional visits and POC.   Addendum by primary PT: Pt continues to be at increased falls risk and continues to work with neurologist on finding the correct dosage of Baclofen to manage hypertonicity.  Pt has shown progress with loftstrands and cane and would benefit from continued skilled PT services to address ongoing impairments and unmet goals.    Comorbidities multiple falls, vitamin D insufficiency, Type 1 IDDM, diabetic retinopathy, hypothyroidism, HTN, hyperlipidemia, R tibia fracture, L ankle  fracture with ORIF    Rehab Potential Good    PT Frequency 2x / week    PT Duration 8 weeks    PT Treatment/Interventions ADLs/Self Care Home Management;Aquatic Therapy;Electrical Stimulation;DME Instruction;Gait training;Stair training;Functional mobility training;Therapeutic activities;Therapeutic exercise;Balance training;Neuromuscular re-education;Patient/family education;Orthotic Fit/Training;Passive range of motion;Energy conservation    PT Next Visit Plan --    Consulted and Agree with Plan of Care Patient         [name], PT, DPT 01/14/20    9:18 PM    Patient will benefit from skilled therapeutic intervention in order to improve the following deficits and impairments:  Abnormal gait, Decreased balance, Decreased endurance, Decreased coordination, Decreased range of motion, Decreased strength, Difficulty walking, Impaired tone, Postural dysfunction, Decreased activity tolerance, Decreased mobility  Visit Diagnosis: Unsteadiness on feet  Difficulty in walking, not elsewhere classified  Other abnormalities of gait and mobility  Foot drop, right  Muscle weakness (generalized)  Repeated falls  Foot drop, left     Problem List Patient Active Problem List   Diagnosis Date Noted  . Gait disturbance 08/19/2019  . Acquired diplegia ([place]) 08/19/2019  . Vitamin D insufficiency 11/29/2016  . Hypogonadism in male 11/01/2016  . 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 Hypothyroidism   . Hypertension   . Hyperlipidemia   . Closed fracture of right fibula and tibia 10/29/2016  . Closed tibia fracture 10/29/2016  . Closed fracture of right tibial plafond with fibula involvement 10/29/2016  . Abnormal liver function tests 03/06/2013  . RASH AND OTHER NONSPECIFIC SKIN ERUPTION 10/07/2009  . SHOULDER PAIN, LEFT 12/27/2008  . Type I (juvenile type) diabetes mellitus without mention of complication, uncontrolled 12/03/2007  . HYPOTHYROIDISM  11/12/2007  . HYPERCHOLESTEROLEMIA 11/12/2007  . MULTIPLE SCLEROSIS 08/29/2007  . PROLIFERATIVE DIABETIC RETINOPATHY 08/29/2007  . HYPERTENSION 12/25/2006    Narda Bonds, PTA [place] 01/08/20 10:53 AM Phone: [phone]  Fax: [place], PT, DPT 01/14/20    9:18 PM      [place] [address] [place], [place], [address] Phone: [phone]   Fax:  [phone]  Name: Johnathan Ross MRN: [id number] Date of Birth: 09-18-1970

## 2020-01-13 ENCOUNTER — Other Ambulatory Visit: Payer: Self-pay

## 2020-01-13 ENCOUNTER — Ambulatory Visit: Payer: Medicare Other | Admitting: Physical Therapy

## 2020-01-13 ENCOUNTER — Encounter: Payer: Self-pay | Admitting: Physical Therapy

## 2020-01-13 DIAGNOSIS — R2681 Unsteadiness on feet: Secondary | ICD-10-CM

## 2020-01-13 DIAGNOSIS — M21371 Foot drop, right foot: Secondary | ICD-10-CM

## 2020-01-13 DIAGNOSIS — R262 Difficulty in walking, not elsewhere classified: Secondary | ICD-10-CM | POA: Diagnosis not present

## 2020-01-13 DIAGNOSIS — R2689 Other abnormalities of gait and mobility: Secondary | ICD-10-CM | POA: Diagnosis not present

## 2020-01-13 DIAGNOSIS — M21372 Foot drop, left foot: Secondary | ICD-10-CM | POA: Diagnosis not present

## 2020-01-13 DIAGNOSIS — M6281 Muscle weakness (generalized): Secondary | ICD-10-CM | POA: Diagnosis not present

## 2020-01-14 ENCOUNTER — Encounter: Payer: Self-pay | Admitting: Physical Therapy

## 2020-01-14 NOTE — Addendum Note (Signed)
Addended by: [name] on: 01/14/2020 09:21 PM   Modules accepted: Orders

## 2020-01-14 NOTE — Therapy (Signed)
[facility] [address] [place], [place], [address] Phone: [phone]   Fax:  [phone]  Physical Therapy Treatment  Patient Details  Name: Johnathan Ross MRN: [id number] Date of Birth: 1970-05-31 Referring Provider (PT): Sater, Nanine Means, MD (previously Dr. Bjorn Loser but pt has changed MS care to [facility])   Encounter Date: 01/13/2020   PT End of Session - 01/13/20 0853    Visit Number 52    Number of Visits 93   Date for PT Re-Evaluation 03/08/2020   Authorization Type Medicare Part A and B; 10th visit PN    Progress Note Due on Visit 35    PT Start Time 0847    PT Stop Time 0930    PT Time Calculation (min) 43 min    Equipment Utilized During Treatment Gait belt    Activity Tolerance Patient tolerated treatment well    Behavior During Therapy [facility] for tasks assessed/performed           Past Medical History:  Diagnosis Date  . Hyperlipidemia   . Hypertension   . Hypogonadism in male 11/01/2016  . Hypothyroidism   . Multiple sclerosis ([place])   . Proliferative diabetic retinopathy(362.02)   . Type 1 diabetes mellitus ([place]) dx'd 1994  . Vitamin D insufficiency 11/29/2016    Past Surgical History:  Procedure Laterality Date  . EYE SURGERY Bilateral    "laser for diabetic retinopathy"  . FRACTURE SURGERY    . OPEN REDUCTION INTERNAL FIXATION (ORIF) TIBIA/FIBULA FRACTURE Right 10/30/2016  . ORIF ANKLE FRACTURE Left 2015  . ORIF TIBIA FRACTURE Right 10/30/2016   Procedure: OPEN REDUCTION INTERNAL FIXATION (ORIF) TIBIA FIBULA FRACTURE;  Surgeon: Altamese [place], MD;  Location: [facility];  Service: Orthopedics;  Laterality: Right;  . RETINAL LASER PROCEDURE Bilateral    "for diabetic retinopathy"    There were no vitals filed for this visit.   Subjective Assessment - 01/13/20 0851    Subjective No new complaints. Taking 2 pills 3x a day with Baclofen and feels the spasticity is getting better.    Pertinent History  multiple falls, vitamin D insufficiency, Type 1 IDDM, diabetic retinopathy, hypothyroidism, HTN, hyperlipidemia, R tibia fracture, L ankle fracture with ORIF    Limitations Walking    Diagnostic tests No new lesions seen on MRI    Patient Stated Goals To improve walking    Currently in Pain? No/denies    Pain Score 0-No pain                 [facility] Adult PT Treatment/Exercise - 01/13/20 0854      Transfers   Transfers Sit to Stand;Stand to Sit    Sit to Stand 5: Supervision;With upper extremity assist;From bed;From chair/3-in-1    Stand to Sit 5: Supervision;To bed;To chair/3-in-1      Ambulation/Gait   Ambulation/Gait Yes    Ambulation Distance (Feet) 115 Feet   x1 with crutces   Assistive device Lofstrands    Gait Pattern Step-through pattern;Step-to pattern;Decreased step length - left;Decreased stance time - right;Decreased stride length;Decreased hip/knee flexion - right;Decreased dorsiflexion - right;Right genu recurvatum;Lateral hip instability;Narrow base of support;Poor foot clearance - right    Ambulation Surface Level;Indoor      Neuro Re-ed    Neuro Re-ed Details  for strengthening/muscle re-ed: tall kneeling on red mat: with 3.3# ball- mini squats holding ball at chest level for 10 reps; lifting ball up over one shoulder diagonal down to opposite floor side for 10  reps each side; in tall kneeling with sustained pull in lateral direction by green theraband: alternating UE raises, bil UE raises, head movements left<>right, up<>down. Rest breaks needed due to loss of balance. Performed with pull in both directions.                  PT Short Term Goals - 12/04/19 1433      PT SHORT TERM GOAL #1   Title Patient will demonstrate independence with updated HEP    Baseline 12/02/19: met with current program which pt reports is still challenging    Status Achieved    Target Date 12/03/19      PT SHORT TERM GOAL #2   Title Pt will improve gait velocity with RW to >/=  0.8 ft/sec    Baseline 12/02/19: 0.64 ft/sec with RW, improved just not to goal with pt ambulating "his way" with LE's locked out    Status Partially Met    Target Date 12/03/19      PT SHORT TERM GOAL #3   Title Pt will improve BERG to >/= 42/56    Baseline 12/02/19: 41/56 scored today, improved from 38/56, just not to goal    Status Partially Met    Target Date 12/03/19      PT SHORT TERM GOAL #4   Title Pt will ambulate x 230' over flat, indoor surfaces with trekking poles with min A and intermittent standing rest breaks.    Baseline 12/04/19: min assist of 2 for up to 40 feet today. Has gone as far as 100 feet with 2 person assist in prior session.    Time --    Period --    Status Partially Met    Target Date 12/03/19      PT SHORT TERM GOAL #5   Title Pt will negotate 8 stairs with two rails step alternating sequence with supervision    Baseline 12/04/19: met in session today    Time --    Period --    Status Achieved    Target Date 12/03/19             PT Long Term Goals - 01/08/20 1049      PT LONG TERM GOAL #1   Title Pt will demonstrate independence with land HEP    Time 12    Period Weeks    Status On-going      PT LONG TERM GOAL #2   Title Pt will improve gait velocity with cane to >/= 0.8 ft/sec    Baseline .34 ft/sec with cane    Time 12    Period Weeks    Status Not Met      PT LONG TERM GOAL #3   Title Pt will improve BERG to >/= 46/56 to decrease falls risk    Baseline 39/56 on 01/08/20    Time 12    Period Weeks    Status Not Met      PT LONG TERM GOAL #4   Title Pt will ambulate x 200' outside over pavement and grass with supervision and cane to improve safety ambulating to wood working shop at home    Time 12    Period Weeks    Status On-going      PT LONG TERM GOAL #5   Title Pt will negotiate 8 stairs with one rail and cane MOD I for safe home entry/exit with cane    Baseline one rail and cane, min-mod A  Time 12    Period Weeks     Status Not Met                 Plan - 01/14/20 1212    Clinical Impression Statement Today's skilled session continued to focus on core/lateral hip strengthening and stability with rest breaks taken as needed. Ended session with continued gait training with bil forearm crutches with increased episodes of left foot catching/extensor tone in stance with pt needing up to min assist and to "stop" to readjust/reset before continueing with gait. The pt should benefit from continued PT to progress toward unmet goals.    PT Next Visit Plan continue with strengthening, gait with crutches and activitites to decrease tone.           Patient will benefit from skilled therapeutic intervention in order to improve the following deficits and impairments:  Abnormal gait, Decreased balance, Decreased endurance, Decreased coordination, Decreased range of motion, Decreased strength, Difficulty walking, Impaired tone, Postural dysfunction, Decreased activity tolerance, Decreased mobility  Visit Diagnosis: Unsteadiness on feet  Difficulty in walking, not elsewhere classified  Other abnormalities of gait and mobility  Foot drop, right  Muscle weakness (generalized)     Problem List Patient Active Problem List   Diagnosis Date Noted  . Gait disturbance 08/19/2019  . Acquired diplegia ([place]) 08/19/2019  . Vitamin D insufficiency 11/29/2016  . Hypogonadism in male 11/01/2016  . 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 Hypothyroidism   . Hypertension   . Hyperlipidemia   . Closed fracture of right fibula and tibia 10/29/2016  . Closed tibia fracture 10/29/2016  . Closed fracture of right tibial plafond with fibula involvement 10/29/2016  . Abnormal liver function tests 03/06/2013  . RASH AND OTHER NONSPECIFIC SKIN ERUPTION 10/07/2009  . SHOULDER PAIN, LEFT 12/27/2008  . Type I (juvenile type) diabetes mellitus without mention of complication, uncontrolled 12/03/2007  .  HYPOTHYROIDISM 11/12/2007  . HYPERCHOLESTEROLEMIA 11/12/2007  . MULTIPLE SCLEROSIS 08/29/2007  . PROLIFERATIVE DIABETIC RETINOPATHY 08/29/2007  . HYPERTENSION 12/25/2006    Willow Ora, PTA, [facility] [address] [place], [place] [address] [phone] 01/15/20, 7:28 AM   Name: Johnathan Ross MRN: [id number] Date of Birth: Sep 09, 1970

## 2020-01-15 ENCOUNTER — Other Ambulatory Visit: Payer: Self-pay

## 2020-01-15 ENCOUNTER — Encounter: Payer: Self-pay | Admitting: Physical Therapy

## 2020-01-15 ENCOUNTER — Ambulatory Visit: Payer: Medicare Other | Admitting: Physical Therapy

## 2020-01-15 DIAGNOSIS — M21371 Foot drop, right foot: Secondary | ICD-10-CM | POA: Diagnosis not present

## 2020-01-15 DIAGNOSIS — R2681 Unsteadiness on feet: Secondary | ICD-10-CM | POA: Diagnosis not present

## 2020-01-15 DIAGNOSIS — R262 Difficulty in walking, not elsewhere classified: Secondary | ICD-10-CM

## 2020-01-15 DIAGNOSIS — M6281 Muscle weakness (generalized): Secondary | ICD-10-CM

## 2020-01-15 DIAGNOSIS — M21372 Foot drop, left foot: Secondary | ICD-10-CM | POA: Diagnosis not present

## 2020-01-15 DIAGNOSIS — R2689 Other abnormalities of gait and mobility: Secondary | ICD-10-CM

## 2020-01-15 NOTE — Therapy (Signed)
[facility] [address] [place], [place], [address] Phone: [phone]   Fax:  [phone]  Physical Therapy Treatment  Patient Details  Name: Johnathan Ross MRN: [id number] Date of Birth: 21-Feb-1971 Referring Provider (PT): Sater, Nanine Means, MD (previously Dr. Bjorn Loser but pt has changed MS care to [facility])   Encounter Date: 01/15/2020   PT End of Session - 01/15/20 0901    Visit Number 60    Number of Visits 93    Date for PT Re-Evaluation 03/08/20    Authorization Type Medicare Part A and B; 10th visit PN    Progress Note Due on Visit 68    PT Start Time 0847    PT Stop Time 0930    PT Time Calculation (min) 43 min    Equipment Utilized During Treatment Gait belt    Activity Tolerance Patient tolerated treatment well    Behavior During Therapy [facility] for tasks assessed/performed           Past Medical History:  Diagnosis Date  . Hyperlipidemia   . Hypertension   . Hypogonadism in male 11/01/2016  . Hypothyroidism   . Multiple sclerosis ([place])   . Proliferative diabetic retinopathy(362.02)   . Type 1 diabetes mellitus ([place]) dx'd 1994  . Vitamin D insufficiency 11/29/2016    Past Surgical History:  Procedure Laterality Date  . EYE SURGERY Bilateral    "laser for diabetic retinopathy"  . FRACTURE SURGERY    . OPEN REDUCTION INTERNAL FIXATION (ORIF) TIBIA/FIBULA FRACTURE Right 10/30/2016  . ORIF ANKLE FRACTURE Left 2015  . ORIF TIBIA FRACTURE Right 10/30/2016   Procedure: OPEN REDUCTION INTERNAL FIXATION (ORIF) TIBIA FIBULA FRACTURE;  Surgeon: Altamese [place], MD;  Location: [facility];  Service: Orthopedics;  Laterality: Right;  . RETINAL LASER PROCEDURE Bilateral    "for diabetic retinopathy"    There were no vitals filed for this visit.   Subjective Assessment - 01/15/20 0854    Subjective No falls or pain to report. Does report the right knee hyperextended this morning with a pop heard. No pain or swelling noted  afterwards. Has not happened again since then. Has noticed a decrease in bil LE's "locking out". Planning to go to 3 pills 3x a day to see if that further helps him.    Patient is accompained by: Family member   mom  in car   Pertinent History multiple falls, vitamin D insufficiency, Type 1 IDDM, diabetic retinopathy, hypothyroidism, HTN, hyperlipidemia, R tibia fracture, L ankle fracture with ORIF    Limitations Walking    Diagnostic tests No new lesions seen on MRI    Patient Stated Goals To improve walking    Currently in Pain? No/denies                [facility] Adult PT Treatment/Exercise - 01/15/20 0903      Transfers   Transfers Sit to Stand;Stand to Sit    Sit to Stand 5: Supervision;With upper extremity assist;From bed;From chair/3-in-1    Stand to Sit 5: Supervision;With upper extremity assist;To bed;To chair/3-in-1      Ambulation/Gait   Ambulation/Gait Yes    Ambulation/Gait Assistance 4: Min guard    Ambulation/Gait Assistance Details no balance issues noted. multiple rest breaks needed due to fatigue. pt able to clear left LE with minimal scuffing today and no extensor tone kicking in .     Ambulation Distance (Feet) 60 Feet   x2   Assistive device Lofstrands  Gait Pattern Step-through pattern;Step-to pattern;Decreased step length - left;Decreased stance time - right;Decreased stride length;Decreased hip/knee flexion - right;Decreased dorsiflexion - right;Right genu recurvatum;Lateral hip instability;Narrow base of support;Poor foot clearance - right    Ambulation Surface Level;Indoor      High Level Balance   High Level Balance Comments in parallel bars with light UE support- side stepping in squat position, diagonal stepping in squat postion fwd/bwd ("electric slide"), and wide stance in squat position walking fwd/bwd [employer]"), all for 3 laps each/each way, rest breaks taken after each lap due to fatigue. Pt able to maintain knee flexion/soft knees with no tone today.                  PT Short Term Goals - 01/14/20 2114      PT SHORT TERM GOAL #1   Title Patient will demonstrate independence with updated HEP    Time 4    Period Weeks    Status Revised    Target Date 02/05/20      PT SHORT TERM GOAL #2   Title Pt will improve gait velocity with cane to >/= 0.5 ft/sec    Time 4    Period Weeks    Status Revised    Target Date 02/05/20      PT SHORT TERM GOAL #3   Title Pt will improve BERG to >/= 42/56    Time 4    Period Weeks    Status Revised    Target Date 02/05/20      PT SHORT TERM GOAL #4   Title Pt will ambulate x 115' over flat, indoor surfaces with cane and with min A and intermittent standing rest breaks.    Time 4    Period Weeks    Status Revised    Target Date 02/05/20      PT SHORT TERM GOAL #5   Target Date 12/03/19             PT Long Term Goals - 01/14/20 2116      PT LONG TERM GOAL #1   Title Pt will demonstrate independence with land HEP    Time 8    Period Weeks    Status Revised    Target Date 03/08/20      PT LONG TERM GOAL #2   Title Pt will improve gait velocity with cane to >/= 0.8 ft/sec    Baseline .34 ft/sec with cane    Time 8    Period Weeks    Status Revised    Target Date 03/08/20      PT LONG TERM GOAL #3   Title Pt will improve BERG to >/= 45/56 to decrease falls risk    Baseline 39/56 on 01/08/20    Time 8    Period Weeks    Status Revised    Target Date 03/08/20      PT LONG TERM GOAL #4   Title Pt will ambulate x 200' outside over pavement and grass with supervision and cane to improve safety ambulating to wood working shop at home    Time 8    Period Weeks    Status Revised    Target Date 03/08/20                 Plan - 01/15/20 0902    Clinical Impression Statement Today's skilled session continued to focus on gait with forearm crutches and balance activities that promote decreased LE tone/emphasis on soft knees. Pt  reports being fatigued today, needing  increased standing rest breaks with tasks, only one seated rest break during session. Despite being tired, less episodes of tone noted with gait and activiites in parallel bars. The pt is progressing toward goals and should benefit from continued PT to progress toward unmet goals.    Comorbidities multiple falls, vitamin D insufficiency, Type 1 IDDM, diabetic retinopathy, hypothyroidism, HTN, hyperlipidemia, R tibia fracture, L ankle fracture with ORIF    Rehab Potential Good    PT Frequency 2x / week    PT Duration 8 weeks    PT Treatment/Interventions ADLs/Self Care Home Management;Aquatic Therapy;Electrical Stimulation;DME Instruction;Gait training;Stair training;Functional mobility training;Therapeutic activities;Therapeutic exercise;Balance training;Neuromuscular re-education;Patient/family education;Orthotic Fit/Training;Passive range of motion;Energy conservation    PT Next Visit Plan continue with strengthening, gait with crutches and activitites to decrease tone.    PT Home Exercise Plan Revised HEP on 3/18    Consulted and Agree with Plan of Care Patient           Patient will benefit from skilled therapeutic intervention in order to improve the following deficits and impairments:  Abnormal gait, Decreased balance, Decreased endurance, Decreased coordination, Decreased range of motion, Decreased strength, Difficulty walking, Impaired tone, Postural dysfunction, Decreased activity tolerance, Decreased mobility  Visit Diagnosis: Unsteadiness on feet  Difficulty in walking, not elsewhere classified  Other abnormalities of gait and mobility  Foot drop, right  Muscle weakness (generalized)     Problem List Patient Active Problem List   Diagnosis Date Noted  . Gait disturbance 08/19/2019  . Acquired diplegia ([place]) 08/19/2019  . Vitamin D insufficiency 11/29/2016  . Hypogonadism in male 11/01/2016  . 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[place])   .  Hypothyroidism   . Hypertension   . Hyperlipidemia   . Closed fracture of right fibula and tibia 10/29/2016  . Closed tibia fracture 10/29/2016  . Closed fracture of right tibial plafond with fibula involvement 10/29/2016  . Abnormal liver function tests 03/06/2013  . RASH AND OTHER NONSPECIFIC SKIN ERUPTION 10/07/2009  . SHOULDER PAIN, LEFT 12/27/2008  . Type I (juvenile type) diabetes mellitus without mention of complication, uncontrolled 12/03/2007  . HYPOTHYROIDISM 11/12/2007  . HYPERCHOLESTEROLEMIA 11/12/2007  . MULTIPLE SCLEROSIS 08/29/2007  . PROLIFERATIVE DIABETIC RETINOPATHY 08/29/2007  . HYPERTENSION 12/25/2006    Willow Ora, PTA, [facility] [address], [place], [place] [address] [phone] 01/15/20, 4:17 PM   Name: Johnathan Ross MRN: [id number] Date of Birth: 21-Aug-1970

## 2020-01-20 ENCOUNTER — Other Ambulatory Visit: Payer: Self-pay

## 2020-01-20 ENCOUNTER — Telehealth: Payer: Self-pay | Admitting: Endocrinology

## 2020-01-20 ENCOUNTER — Encounter: Payer: Self-pay | Admitting: Physical Therapy

## 2020-01-20 ENCOUNTER — Ambulatory Visit: Payer: Medicare Other | Admitting: Physical Therapy

## 2020-01-20 DIAGNOSIS — M6281 Muscle weakness (generalized): Secondary | ICD-10-CM | POA: Diagnosis not present

## 2020-01-20 DIAGNOSIS — R2689 Other abnormalities of gait and mobility: Secondary | ICD-10-CM

## 2020-01-20 DIAGNOSIS — R262 Difficulty in walking, not elsewhere classified: Secondary | ICD-10-CM

## 2020-01-20 DIAGNOSIS — M21371 Foot drop, right foot: Secondary | ICD-10-CM | POA: Diagnosis not present

## 2020-01-20 DIAGNOSIS — R2681 Unsteadiness on feet: Secondary | ICD-10-CM

## 2020-01-20 DIAGNOSIS — M21372 Foot drop, left foot: Secondary | ICD-10-CM | POA: Diagnosis not present

## 2020-01-20 NOTE — Telephone Encounter (Signed)
Noted  

## 2020-01-20 NOTE — Telephone Encounter (Signed)
Receipt of this form does not need to generate a telephone encounter, will be completed in due course

## 2020-01-20 NOTE — Telephone Encounter (Signed)
Most recent record sent to [facility] per request to approve pt's CGM

## 2020-01-20 NOTE — Therapy (Addendum)
[facility] [address] [place], [place], [address] Phone: [phone]   Fax:  [phone]  Physical Therapy Treatment and 10th visit Progress Note  Patient Details  Name: Johnathan Ross MRN: [id number] Date of Birth: Oct 27, 1970 Referring Provider (PT): Sater, Nanine Means, MD (previously Dr. Bjorn Loser but pt has changed MS care to [facility])   Encounter Date: 01/20/2020   Progress Note Reporting Period 12/11/2019 to 01/20/2020  See note below for Objective Data and Assessment of Progress/Goals.   [name], PT, DPT 01/20/20    9:01 PM     PT End of Session - 01/20/20 0852    Visit Number 80    Number of Visits 93    Date for PT Re-Evaluation 03/08/20    Authorization Type Medicare Part A and B; 10th visit PN    Progress Note Due on Visit 99    PT Start Time 0848    PT Stop Time 0930    PT Time Calculation (min) 42 min    Equipment Utilized During Treatment Gait belt    Activity Tolerance Patient tolerated treatment well    Behavior During Therapy WFL for tasks assessed/performed           Past Medical History:  Diagnosis Date  . Hyperlipidemia   . Hypertension   . Hypogonadism in male 11/01/2016  . Hypothyroidism   . Multiple sclerosis ([place])   . Proliferative diabetic retinopathy(362.02)   . Type 1 diabetes mellitus ([place]) dx'd 1994  . Vitamin D insufficiency 11/29/2016    Past Surgical History:  Procedure Laterality Date  . EYE SURGERY Bilateral    "laser for diabetic retinopathy"  . FRACTURE SURGERY    . OPEN REDUCTION INTERNAL FIXATION (ORIF) TIBIA/FIBULA FRACTURE Right 10/30/2016  . ORIF ANKLE FRACTURE Left 2015  . ORIF TIBIA FRACTURE Right 10/30/2016   Procedure: OPEN REDUCTION INTERNAL FIXATION (ORIF) TIBIA FIBULA FRACTURE;  Surgeon: Altamese St. Francis, MD;  Location: [facility];  Service: Orthopedics;  Laterality: Right;  . RETINAL LASER PROCEDURE Bilateral    "for diabetic retinopathy"    There were no  vitals filed for this visit.   Subjective Assessment - 01/20/20 0851    Subjective No new complaints. No falls or pain.    Patient is accompained by: Family member   mom in car   Pertinent History multiple falls, vitamin D insufficiency, Type 1 IDDM, diabetic retinopathy, hypothyroidism, HTN, hyperlipidemia, R tibia fracture, L ankle fracture with ORIF    Diagnostic tests No new lesions seen on MRI    Patient Stated Goals To improve walking    Currently in Pain? No/denies    Pain Score 0-No pain                [facility] Adult PT Treatment/Exercise - 01/20/20 0853      Transfers   Transfers Sit to Stand;Stand to Sit    Sit to Stand 5: Supervision;With upper extremity assist;From bed;From chair/3-in-1    Stand to Sit 5: Supervision;With upper extremity assist;To bed;To chair/3-in-1      Ambulation/Gait   Ambulation/Gait Yes    Ambulation/Gait Assistance 4: Min guard    Ambulation/Gait Assistance Details pt needing to stop briefly with gait due to fatigue and occasional spasticity on left LE to "soften knee" to continue with gait. no balance issues noted with gait.     Ambulation Distance (Feet) 60 Feet   x1, 175 x1   Assistive device Lofstrands    Gait Pattern Step-through  pattern;Step-to pattern;Decreased step length - left;Decreased stance time - right;Decreased stride length;Decreased hip/knee flexion - right;Decreased dorsiflexion - right;Right genu recurvatum;Lateral hip instability;Narrow base of support;Poor foot clearance - right    Ambulation Surface Level;Indoor               Balance Exercises - 01/20/20 1248      Balance Exercises: Standing   Rockerboard Anterior/posterior;Lateral;Head turns;EO;EC;Limitations;Intermittent UE support    Rockerboard Limitations performed both ways on balance board: rocking the board with EO with emphasis on keeping knees soft so to not kick in extensor tone; then holding the board steady: EO for alternating UE raises, bil UE raises for  10 reps each, min guard to min assist with occasional touch to bars for balance; then with arms at sides for EO head movements left<>right, up<>down for 10 reps each, min guard to min assist for balance with occasional touch to bars; with arms at sides for EC 30 sec's for 3 reps, with min guard to min assist for balance. cues needed on posture and weight shifting for balance assist/correction.                PT Short Term Goals - 01/14/20 2114      PT SHORT TERM GOAL #1   Title Patient will demonstrate independence with updated HEP    Time 4    Period Weeks    Status Revised    Target Date 02/05/20      PT SHORT TERM GOAL #2   Title Pt will improve gait velocity with cane to >/= 0.5 ft/sec    Time 4    Period Weeks    Status Revised    Target Date 02/05/20      PT SHORT TERM GOAL #3   Title Pt will improve BERG to >/= 42/56    Time 4    Period Weeks    Status Revised    Target Date 02/05/20      PT SHORT TERM GOAL #4   Title Pt will ambulate x 115' over flat, indoor surfaces with cane and with min A and intermittent standing rest breaks.    Time 4    Period Weeks    Status Revised    Target Date 02/05/20      PT SHORT TERM GOAL #5   Target Date 12/03/19             PT Long Term Goals - 01/14/20 2116      PT LONG TERM GOAL #1   Title Pt will demonstrate independence with land HEP    Time 8    Period Weeks    Status Revised    Target Date 03/08/20      PT LONG TERM GOAL #2   Title Pt will improve gait velocity with cane to >/= 0.8 ft/sec    Baseline .34 ft/sec with cane    Time 8    Period Weeks    Status Revised    Target Date 03/08/20      PT LONG TERM GOAL #3   Title Pt will improve BERG to >/= 45/56 to decrease falls risk    Baseline 39/56 on 01/08/20    Time 8    Period Weeks    Status Revised    Target Date 03/08/20      PT LONG TERM GOAL #4   Title Pt will ambulate x 200' outside over pavement and grass with supervision and cane to improve  safety ambulating  to wood working shop at home    Time 8    Period Weeks    Status Revised    Target Date 03/08/20                 Plan - 01/20/20 0853    Clinical Impression Statement Today's skilled session continued to work on gait with forearm crutches and balance reactions on compliant surfaces with no issues noted or reported except for extensor tone kicking in at times. The pt is making steady progress toward goals and should benefit from continued PT to progress toward unmet goals.    Comorbidities multiple falls, vitamin D insufficiency, Type 1 IDDM, diabetic retinopathy, hypothyroidism, HTN, hyperlipidemia, R tibia fracture, L ankle fracture with ORIF    Rehab Potential Good    PT Frequency 2x / week    PT Duration 8 weeks    PT Treatment/Interventions ADLs/Self Care Home Management;Aquatic Therapy;Electrical Stimulation;DME Instruction;Gait training;Stair training;Functional mobility training;Therapeutic activities;Therapeutic exercise;Balance training;Neuromuscular re-education;Patient/family education;Orthotic Fit/Training;Passive range of motion;Energy conservation    PT Next Visit Plan continue with strengthening, gait with crutches and activitites to decrease tone.    PT Home Exercise Plan Revised HEP on 3/18    Consulted and Agree with Plan of Care Patient           Patient will benefit from skilled therapeutic intervention in order to improve the following deficits and impairments:  Abnormal gait, Decreased balance, Decreased endurance, Decreased coordination, Decreased range of motion, Decreased strength, Difficulty walking, Impaired tone, Postural dysfunction, Decreased activity tolerance, Decreased mobility  Visit Diagnosis: Unsteadiness on feet  Difficulty in walking, not elsewhere classified  Other abnormalities of gait and mobility  Foot drop, right  Muscle weakness (generalized)     Problem List Patient Active Problem List   Diagnosis Date  Noted  . Gait disturbance 08/19/2019  . Acquired diplegia ([place]) 08/19/2019  . Vitamin D insufficiency 11/29/2016  . Hypogonadism in male 11/01/2016  . 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[place])   . Hypothyroidism   . Hypertension   . Hyperlipidemia   . Closed fracture of right fibula and tibia 10/29/2016  . Closed tibia fracture 10/29/2016  . Closed fracture of right tibial plafond with fibula involvement 10/29/2016  . Abnormal liver function tests 03/06/2013  . RASH AND OTHER NONSPECIFIC SKIN ERUPTION 10/07/2009  . SHOULDER PAIN, LEFT 12/27/2008  . Type I (juvenile type) diabetes mellitus without mention of complication, uncontrolled 12/03/2007  . HYPOTHYROIDISM 11/12/2007  . HYPERCHOLESTEROLEMIA 11/12/2007  . MULTIPLE SCLEROSIS 08/29/2007  . PROLIFERATIVE DIABETIC RETINOPATHY 08/29/2007  . HYPERTENSION 12/25/2006    Willow Ora, PTA, [facility] [address], [place],  [address] [phone] 01/20/20, 1:08 PM   Name: Johnathan Ross MRN: [id number] Date of Birth: 09-04-1970

## 2020-01-20 NOTE — Telephone Encounter (Signed)
Received LMN for pump and pump supplies, placed on Dr. Ronnie Derby desk for review and signature

## 2020-01-22 ENCOUNTER — Other Ambulatory Visit: Payer: Self-pay

## 2020-01-22 ENCOUNTER — Encounter: Payer: Self-pay | Admitting: Physical Therapy

## 2020-01-22 ENCOUNTER — Ambulatory Visit: Payer: Medicare Other | Admitting: Physical Therapy

## 2020-01-22 DIAGNOSIS — R2689 Other abnormalities of gait and mobility: Secondary | ICD-10-CM | POA: Diagnosis not present

## 2020-01-22 DIAGNOSIS — M21371 Foot drop, right foot: Secondary | ICD-10-CM

## 2020-01-22 DIAGNOSIS — R2681 Unsteadiness on feet: Secondary | ICD-10-CM | POA: Diagnosis not present

## 2020-01-22 DIAGNOSIS — M6281 Muscle weakness (generalized): Secondary | ICD-10-CM | POA: Diagnosis not present

## 2020-01-22 DIAGNOSIS — R262 Difficulty in walking, not elsewhere classified: Secondary | ICD-10-CM

## 2020-01-22 DIAGNOSIS — R296 Repeated falls: Secondary | ICD-10-CM

## 2020-01-22 DIAGNOSIS — M21372 Foot drop, left foot: Secondary | ICD-10-CM | POA: Diagnosis not present

## 2020-01-22 NOTE — Therapy (Signed)
[facility] [address] [place], [place], [address] Phone: [phone]   Fax:  [phone]  Physical Therapy Treatment  Patient Details  Name: Johnathan Ross MRN: [id number] Date of Birth: 11-03-1970 Referring Provider (PT): Sater, Nanine Means, MD (previously Dr. Bjorn Loser but pt has changed MS care to [facility])   Encounter Date: 01/22/2020   PT End of Session - 01/22/20 0954    Visit Number 81    Number of Visits 42    Date for PT Re-Evaluation 03/08/20    Authorization Type Medicare Part A and B; 10th visit PN    Progress Note Due on Visit 23    PT Start Time 0850    PT Stop Time 0935    PT Time Calculation (min) 45 min    Activity Tolerance Patient tolerated treatment well    Behavior During Therapy [facility] for tasks assessed/performed           Past Medical History:  Diagnosis Date  . Hyperlipidemia   . Hypertension   . Hypogonadism in male 11/01/2016  . Hypothyroidism   . Multiple sclerosis ([place])   . Proliferative diabetic retinopathy(362.02)   . Type 1 diabetes mellitus ([place]) dx'd 1994  . Vitamin D insufficiency 11/29/2016    Past Surgical History:  Procedure Laterality Date  . EYE SURGERY Bilateral    "laser for diabetic retinopathy"  . FRACTURE SURGERY    . OPEN REDUCTION INTERNAL FIXATION (ORIF) TIBIA/FIBULA FRACTURE Right 10/30/2016  . ORIF ANKLE FRACTURE Left 2015  . ORIF TIBIA FRACTURE Right 10/30/2016   Procedure: OPEN REDUCTION INTERNAL FIXATION (ORIF) TIBIA FIBULA FRACTURE;  Surgeon: Altamese Stiles, MD;  Location: [facility];  Service: Orthopedics;  Laterality: Right;  . RETINAL LASER PROCEDURE Bilateral    "for diabetic retinopathy"    There were no vitals filed for this visit.   Subjective Assessment - 01/22/20 0855    Subjective Felt like last session was a good work out.  Is up to 2.5 Baclofen at night; hasn't work up to 3 yet.    Patient is accompained by: Family member   mom in car   Pertinent  History multiple falls, vitamin D insufficiency, Type 1 IDDM, diabetic retinopathy, hypothyroidism, HTN, hyperlipidemia, R tibia fracture, L ankle fracture with ORIF    Diagnostic tests No new lesions seen on MRI    Patient Stated Goals To improve walking    Currently in Pain? No/denies                             [facility] Adult PT Treatment/Exercise - 01/22/20 0859      Ambulation/Gait   Ambulation/Gait Yes    Ambulation/Gait Assistance 4: Min guard    Ambulation/Gait Assistance Details with loftstrand crutches pt continues to demonstrate improved upright trunk posture and hip/knee flexion activation but continues to experience intermittent toe drag on R; also noted to have increased R ankle inversion and rolling out laterally today.    Ambulation Distance (Feet) 60 Feet    Assistive device Lofstrands    Gait Pattern Step-through pattern;Decreased step length - right;Decreased stride length;Decreased hip/knee flexion - right;Decreased dorsiflexion - right;Decreased trunk rotation;Poor foot clearance - right    Ambulation Surface Level;Indoor      Knee/Hip Exercises: Supine   Other Supine Knee/Hip Exercises Hamstring curls with LE on red ball, 2sets x 10 reps.  Cues to initiate movement with core and exhalation to minimize extensor tone  Knee/Hip Exercises: Sidelying   Other Sidelying Knee/Hip Exercises R and L sidelying with top foot on red ball performing 10 reps x 2 sets hip flexion<> extension with assistance and then without therapist assistance to focus on core stability, intiation and control of LE movement.               Balance Exercises - 01/22/20 0001      Balance Exercises: Standing   Rockerboard Anterior/posterior;Lateral;Head turns;EO;10 reps;Intermittent UE support;Limitations    Rockerboard Limitations For lateral performed weight shifting and then performed holding balance while performing 10 reps each vertical and horizontal head turns; also  performed 2 sets x 10 reps alternating UE flexion with min A to maintain balance and maintain upright trunk.  With ant/post just focused on weight shifting and self correction of LOB               PT Short Term Goals - 01/14/20 2114      PT SHORT TERM GOAL #1   Title Patient will demonstrate independence with updated HEP    Time 4    Period Weeks    Status Revised    Target Date 02/05/20      PT SHORT TERM GOAL #2   Title Pt will improve gait velocity with cane to >/= 0.5 ft/sec    Time 4    Period Weeks    Status Revised    Target Date 02/05/20      PT SHORT TERM GOAL #3   Title Pt will improve BERG to >/= 42/56    Time 4    Period Weeks    Status Revised    Target Date 02/05/20      PT SHORT TERM GOAL #4   Title Pt will ambulate x 115' over flat, indoor surfaces with cane and with min A and intermittent standing rest breaks.    Time 4    Period Weeks    Status Revised    Target Date 02/05/20      PT SHORT TERM GOAL #5   Target Date 12/03/19             PT Long Term Goals - 01/14/20 2116      PT LONG TERM GOAL #1   Title Pt will demonstrate independence with land HEP    Time 8    Period Weeks    Status Revised    Target Date 03/08/20      PT LONG TERM GOAL #2   Title Pt will improve gait velocity with cane to >/= 0.8 ft/sec    Baseline .34 ft/sec with cane    Time 8    Period Weeks    Status Revised    Target Date 03/08/20      PT LONG TERM GOAL #3   Title Pt will improve BERG to >/= 45/56 to decrease falls risk    Baseline 39/56 on 01/08/20    Time 8    Period Weeks    Status Revised    Target Date 03/08/20      PT LONG TERM GOAL #4   Title Pt will ambulate x 200' outside over pavement and grass with supervision and cane to improve safety ambulating to wood working shop at home    Time 8    Period Weeks    Status Revised    Target Date 03/08/20                 Plan - 01/22/20 7829  Clinical Impression Statement Continued to  progress glute med and hip/knee flexion strengthening and motor planning by adding physioball for unstable surface for increased stability training.  Also continued to perform dynamic balance and balance reaction training on rockerboard with pt demonstrating improved core and proximal hip stability during weight shifting and when adding in head and UE movements.    Comorbidities multiple falls, vitamin D insufficiency, Type 1 IDDM, diabetic retinopathy, hypothyroidism, HTN, hyperlipidemia, R tibia fracture, L ankle fracture with ORIF    Rehab Potential Good    PT Frequency 2x / week    PT Duration 8 weeks    PT Treatment/Interventions ADLs/Self Care Home Management;Aquatic Therapy;Electrical Stimulation;DME Instruction;Gait training;Stair training;Functional mobility training;Therapeutic activities;Therapeutic exercise;Balance training;Neuromuscular re-education;Patient/family education;Orthotic Fit/Training;Passive range of motion;Energy conservation    PT Next Visit Plan glute and hamstring strengthening, rockerboard weight shifting and balace training, gait with crutches and activitites to decrease tone.  Cane when appropriate    PT Home Exercise Plan Revised HEP on 3/18    Consulted and Agree with Plan of Care Patient           Patient will benefit from skilled therapeutic intervention in order to improve the following deficits and impairments:  Abnormal gait, Decreased balance, Decreased endurance, Decreased coordination, Decreased range of motion, Decreased strength, Difficulty walking, Impaired tone, Postural dysfunction, Decreased activity tolerance, Decreased mobility  Visit Diagnosis: Unsteadiness on feet  Difficulty in walking, not elsewhere classified  Other abnormalities of gait and mobility  Foot drop, right  Muscle weakness (generalized)  Repeated falls     Problem List Patient Active Problem List   Diagnosis Date Noted  . Gait disturbance 08/19/2019  . Acquired  diplegia ([place]) 08/19/2019  . Vitamin D insufficiency 11/29/2016  . Hypogonadism in male 11/01/2016  . 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[place])   . Hypothyroidism   . Hypertension   . Hyperlipidemia   . Closed fracture of right fibula and tibia 10/29/2016  . Closed tibia fracture 10/29/2016  . Closed fracture of right tibial plafond with fibula involvement 10/29/2016  . Abnormal liver function tests 03/06/2013  . RASH AND OTHER NONSPECIFIC SKIN ERUPTION 10/07/2009  . SHOULDER PAIN, LEFT 12/27/2008  . Type I (juvenile type) diabetes mellitus without mention of complication, uncontrolled 12/03/2007  . HYPOTHYROIDISM 11/12/2007  . HYPERCHOLESTEROLEMIA 11/12/2007  . MULTIPLE SCLEROSIS 08/29/2007  . PROLIFERATIVE DIABETIC RETINOPATHY 08/29/2007  . HYPERTENSION 12/25/2006    [name], PT, DPT 01/22/20    9:58 AM    [facility] [address] [place], [place], [address] Phone: [phone]   Fax:  [phone]  Name: Johnathan Ross MRN: [id number] Date of Birth: 14-Apr-1971

## 2020-01-24 ENCOUNTER — Other Ambulatory Visit: Payer: Self-pay | Admitting: Endocrinology

## 2020-01-27 ENCOUNTER — Encounter: Payer: Self-pay | Admitting: Physical Therapy

## 2020-01-27 ENCOUNTER — Other Ambulatory Visit: Payer: Self-pay

## 2020-01-27 ENCOUNTER — Ambulatory Visit: Payer: Medicare Other | Attending: Physician Assistant | Admitting: Physical Therapy

## 2020-01-27 DIAGNOSIS — M6281 Muscle weakness (generalized): Secondary | ICD-10-CM | POA: Diagnosis not present

## 2020-01-27 DIAGNOSIS — M21372 Foot drop, left foot: Secondary | ICD-10-CM | POA: Insufficient documentation

## 2020-01-27 DIAGNOSIS — R2689 Other abnormalities of gait and mobility: Secondary | ICD-10-CM

## 2020-01-27 DIAGNOSIS — M21371 Foot drop, right foot: Secondary | ICD-10-CM | POA: Insufficient documentation

## 2020-01-27 DIAGNOSIS — R296 Repeated falls: Secondary | ICD-10-CM | POA: Insufficient documentation

## 2020-01-27 DIAGNOSIS — R262 Difficulty in walking, not elsewhere classified: Secondary | ICD-10-CM | POA: Insufficient documentation

## 2020-01-27 DIAGNOSIS — R2681 Unsteadiness on feet: Secondary | ICD-10-CM | POA: Diagnosis not present

## 2020-01-27 NOTE — Therapy (Signed)
[facility] [address] [place], [place], [address] Phone: [phone]   Fax:  [phone]  Physical Therapy Treatment  Patient Details  Name: Johnathan Ross MRN: [id number] Date of Birth: December 07, 1970 Referring Provider (PT): Sater, Nanine Means, MD (previously Dr. Bjorn Loser but pt has changed MS care to [facility])   Encounter Date: 01/27/2020   PT End of Session - 01/27/20 0854    Visit Number 82    Number of Visits 93    Date for PT Re-Evaluation 03/08/20    Authorization Type Medicare Part A and B; 10th visit PN    Progress Note Due on Visit 80    PT Start Time 0848    PT Stop Time 0930    PT Time Calculation (min) 42 min    Equipment Utilized During Treatment Gait belt    Activity Tolerance Patient tolerated treatment well    Behavior During Therapy WFL for tasks assessed/performed           Past Medical History:  Diagnosis Date  . Hyperlipidemia   . Hypertension   . Hypogonadism in male 11/01/2016  . Hypothyroidism   . Multiple sclerosis ([place])   . Proliferative diabetic retinopathy(362.02)   . Type 1 diabetes mellitus ([place]) dx'd 1994  . Vitamin D insufficiency 11/29/2016    Past Surgical History:  Procedure Laterality Date  . EYE SURGERY Bilateral    "laser for diabetic retinopathy"  . FRACTURE SURGERY    . OPEN REDUCTION INTERNAL FIXATION (ORIF) TIBIA/FIBULA FRACTURE Right 10/30/2016  . ORIF ANKLE FRACTURE Left 2015  . ORIF TIBIA FRACTURE Right 10/30/2016   Procedure: OPEN REDUCTION INTERNAL FIXATION (ORIF) TIBIA FIBULA FRACTURE;  Surgeon: Altamese , MD;  Location: [facility];  Service: Orthopedics;  Laterality: Right;  . RETINAL LASER PROCEDURE Bilateral    "for diabetic retinopathy"    There were no vitals filed for this visit.   Subjective Assessment - 01/27/20 0853    Subjective No new complaints. No falls or pain. Increased Baclofen to 3 pills last night and this morning.    Patient is accompained by:  Family member   mom in car   Pertinent History multiple falls, vitamin D insufficiency, Type 1 IDDM, diabetic retinopathy, hypothyroidism, HTN, hyperlipidemia, R tibia fracture, L ankle fracture with ORIF    Limitations Walking    Diagnostic tests No new lesions seen on MRI    Patient Stated Goals To improve walking    Currently in Pain? No/denies    Pain Score 0-No pain                 [facility] Adult PT Treatment/Exercise - 01/27/20 0855      Transfers   Transfers Sit to Stand;Stand to Sit    Sit to Stand 5: Supervision;With upper extremity assist;From bed;From chair/3-in-1    Stand to Sit 5: Supervision;With upper extremity assist;To bed;To chair/3-in-1      Ambulation/Gait   Ambulation/Gait Yes    Ambulation/Gait Assistance 4: Min guard    Ambulation/Gait Assistance Details pt needing to stop 3 times total to re-adjust due to tone on left LE. no significant balance loss noted, min guard assist for safety.     Ambulation Distance (Feet) 50 Feet   x2   Assistive device Lofstrands    Gait Pattern Step-through pattern;Decreased step length - right;Decreased stride length;Decreased hip/knee flexion - right;Decreased dorsiflexion - right;Decreased trunk rotation;Poor foot clearance - right    Ambulation Surface Level;Indoor  High Level Balance   High Level Balance Comments in parallel bars with light UE support: side stepping in squat position, then fwd/bwd walking with wide BOS in squat position ("cowboy"), for 3 laps each/each way. cues to keep knees soft with tall trunk, light UE support on bars with min guard assist for balance.       Knee/Hip Exercises: Supine   Bridges AROM;Strengthening;Both;2 sets;10 reps;Limitations    Bridges Limitations with red pball under knees, min assist to stabilize ball at times with arms on mat next to hips. 5 sec hold with each rep.     Other Supine Knee/Hip Exercises Hamstring curls with LE on red ball, 2 sets x 10 reps. Assist for control of  ball and to keep right LE/knee from falling out with rolling into flexion. Cues to initiate movement with core and exhalation to minimize extensor tone               Balance Exercises - 01/27/20 0001      Balance Exercises: Standing   Rockerboard Anterior/posterior;Head turns;EO;Other reps (comment);Intermittent UE support;Limitations    Rockerboard Limitations ant/post direction only with light support to no support on bars: rocking the board minimally due to limited ankle range with AFO with emphasis on posture and soft knees; then holding the board steady with emphasis on soft knees for alternating UE raises, bil UE raises, progressing to arms at sides with head turns, then head nods with eyes open. min guard to min assist for balance, cues on posture and weight shifting to assist with balance.                PT Short Term Goals - 01/14/20 2114      PT SHORT TERM GOAL #1   Title Patient will demonstrate independence with updated HEP    Time 4    Period Weeks    Status Revised    Target Date 02/05/20      PT SHORT TERM GOAL #2   Title Pt will improve gait velocity with cane to >/= 0.5 ft/sec    Time 4    Period Weeks    Status Revised    Target Date 02/05/20      PT SHORT TERM GOAL #3   Title Pt will improve BERG to >/= 42/56    Time 4    Period Weeks    Status Revised    Target Date 02/05/20      PT SHORT TERM GOAL #4   Title Pt will ambulate x 115' over flat, indoor surfaces with cane and with min A and intermittent standing rest breaks.    Time 4    Period Weeks    Status Revised    Target Date 02/05/20      PT SHORT TERM GOAL #5   Target Date 12/03/19             PT Long Term Goals - 01/14/20 2116      PT LONG TERM GOAL #1   Title Pt will demonstrate independence with land HEP    Time 8    Period Weeks    Status Revised    Target Date 03/08/20      PT LONG TERM GOAL #2   Title Pt will improve gait velocity with cane to >/= 0.8 ft/sec     Baseline .34 ft/sec with cane    Time 8    Period Weeks    Status Revised    Target Date 03/08/20  PT LONG TERM GOAL #3   Title Pt will improve BERG to >/= 45/56 to decrease falls risk    Baseline 39/56 on 01/08/20    Time 8    Period Weeks    Status Revised    Target Date 03/08/20      PT LONG TERM GOAL #4   Title Pt will ambulate x 200' outside over pavement and grass with supervision and cane to improve safety ambulating to wood working shop at home    Time 8    Period Weeks    Status Revised    Target Date 03/08/20                 Plan - 01/27/20 0854    Clinical Impression Statement Today's skiled session continued to focus on strengthening, gait with loftstrands and balance reactions. Rest breaks taken as needed with session. Less episodes of left knee extensor tone this session with none during ex's/balance and only 3 with gait. The pt is progressing toward goals and should benefit from continued PT to progress toward unmet goals.    Comorbidities multiple falls, vitamin D insufficiency, Type 1 IDDM, diabetic retinopathy, hypothyroidism, HTN, hyperlipidemia, R tibia fracture, L ankle fracture with ORIF    Rehab Potential Good    PT Frequency 2x / week    PT Duration 8 weeks    PT Treatment/Interventions ADLs/Self Care Home Management;Aquatic Therapy;Electrical Stimulation;DME Instruction;Gait training;Stair training;Functional mobility training;Therapeutic activities;Therapeutic exercise;Balance training;Neuromuscular re-education;Patient/family education;Orthotic Fit/Training;Passive range of motion;Energy conservation    PT Next Visit Plan glute and hamstring strengthening, rockerboard weight shifting and balace training, gait with crutches and activitites to decrease tone.  Cane when appropriate    PT Home Exercise Plan Revised HEP on 3/18    Consulted and Agree with Plan of Care Patient           Patient will benefit from skilled therapeutic intervention in  order to improve the following deficits and impairments:  Abnormal gait, Decreased balance, Decreased endurance, Decreased coordination, Decreased range of motion, Decreased strength, Difficulty walking, Impaired tone, Postural dysfunction, Decreased activity tolerance, Decreased mobility  Visit Diagnosis: Unsteadiness on feet  Difficulty in walking, not elsewhere classified  Other abnormalities of gait and mobility  Muscle weakness (generalized)  Foot drop, right     Problem List Patient Active Problem List   Diagnosis Date Noted  . Gait disturbance 08/19/2019  . Acquired diplegia ([place]) 08/19/2019  . Vitamin D insufficiency 11/29/2016  . Hypogonadism in male 11/01/2016  . 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 Hypothyroidism   . Hypertension   . Hyperlipidemia   . Closed fracture of right fibula and tibia 10/29/2016  . Closed tibia fracture 10/29/2016  . Closed fracture of right tibial plafond with fibula involvement 10/29/2016  . Abnormal liver function tests 03/06/2013  . RASH AND OTHER NONSPECIFIC SKIN ERUPTION 10/07/2009  . SHOULDER PAIN, LEFT 12/27/2008  . Type I (juvenile type) diabetes mellitus without mention of complication, uncontrolled 12/03/2007  . HYPOTHYROIDISM 11/12/2007  . HYPERCHOLESTEROLEMIA 11/12/2007  . MULTIPLE SCLEROSIS 08/29/2007  . PROLIFERATIVE DIABETIC RETINOPATHY 08/29/2007  . HYPERTENSION 12/25/2006    Willow Ora, PTA, [place] [address] [place], [place] [address] [phone] 01/27/20, 1:46 PM   Name: Johnathan Ross MRN: [id number] Date of Birth: 06/24/1970

## 2020-01-29 ENCOUNTER — Encounter: Payer: Self-pay | Admitting: Physical Therapy

## 2020-01-29 ENCOUNTER — Ambulatory Visit: Payer: Medicare Other | Admitting: Physical Therapy

## 2020-01-29 ENCOUNTER — Other Ambulatory Visit: Payer: Self-pay

## 2020-01-29 DIAGNOSIS — R262 Difficulty in walking, not elsewhere classified: Secondary | ICD-10-CM | POA: Diagnosis not present

## 2020-01-29 DIAGNOSIS — M21371 Foot drop, right foot: Secondary | ICD-10-CM

## 2020-01-29 DIAGNOSIS — R2689 Other abnormalities of gait and mobility: Secondary | ICD-10-CM | POA: Diagnosis not present

## 2020-01-29 DIAGNOSIS — M6281 Muscle weakness (generalized): Secondary | ICD-10-CM | POA: Diagnosis not present

## 2020-01-29 DIAGNOSIS — R2681 Unsteadiness on feet: Secondary | ICD-10-CM | POA: Diagnosis not present

## 2020-01-29 DIAGNOSIS — R296 Repeated falls: Secondary | ICD-10-CM

## 2020-01-29 NOTE — Therapy (Signed)
[facility] [address] [place], [place], [address] Phone: [phone]   Fax:  [phone]  Physical Therapy Treatment  Patient Details  Name: Johnathan Ross MRN: [id number] Date of Birth: 08-Nov-1970 Referring Provider (PT): Sater, Nanine Means, MD (previously Dr. Bjorn Loser but pt has changed MS care to [facility])   Encounter Date: 01/29/2020   PT End of Session - 01/29/20 0944    Visit Number 73    Number of Visits 93    Date for PT Re-Evaluation 03/08/20    Authorization Type Medicare Part A and B; 10th visit PN    Progress Note Due on Visit 73    PT Start Time 0846    PT Stop Time 0930    PT Time Calculation (min) 44 min    Activity Tolerance Patient tolerated treatment well    Behavior During Therapy [facility] for tasks assessed/performed           Past Medical History:  Diagnosis Date  . Hyperlipidemia   . Hypertension   . Hypogonadism in male 11/01/2016  . Hypothyroidism   . Multiple sclerosis ([place])   . Proliferative diabetic retinopathy(362.02)   . Type 1 diabetes mellitus ([place]) dx'd 1994  . Vitamin D insufficiency 11/29/2016    Past Surgical History:  Procedure Laterality Date  . EYE SURGERY Bilateral    "laser for diabetic retinopathy"  . FRACTURE SURGERY    . OPEN REDUCTION INTERNAL FIXATION (ORIF) TIBIA/FIBULA FRACTURE Right 10/30/2016  . ORIF ANKLE FRACTURE Left 2015  . ORIF TIBIA FRACTURE Right 10/30/2016   Procedure: OPEN REDUCTION INTERNAL FIXATION (ORIF) TIBIA FIBULA FRACTURE;  Surgeon: Altamese Randall, MD;  Location: [facility];  Service: Orthopedics;  Laterality: Right;  . RETINAL LASER PROCEDURE Bilateral    "for diabetic retinopathy"    There were no vitals filed for this visit.   Subjective Assessment - 01/29/20 0852    Subjective Took 3 baclofen pills this morning.  R knee was popping back yesterday and is a little sore today.  Biceps are still a little sore from gripping mat during exercises.    Patient  is accompained by: Family member   mom in car   Pertinent History multiple falls, vitamin D insufficiency, Type 1 IDDM, diabetic retinopathy, hypothyroidism, HTN, hyperlipidemia, R tibia fracture, L ankle fracture with ORIF    Limitations Walking    Diagnostic tests No new lesions seen on MRI    Patient Stated Goals To improve walking    Currently in Pain? Yes                             [facility] Adult PT Treatment/Exercise - 01/29/20 0902      Transfers   Transfers Sit to Stand;Stand to Sit    Sit to Stand 6: Modified independent (Device/Increase time)    Stand to Sit 6: Modified independent (Device/Increase time)      Ambulation/Gait   Ambulation/Gait Yes    Ambulation/Gait Assistance 4: Min guard    Ambulation/Gait Assistance Details increased number of rest breaks today due to LE fatigue and toe drag.      Ambulation Distance (Feet) 115 Feet    Assistive device Lofstrands    Gait Pattern Step-through pattern;Decreased step length - right;Decreased step length - left;Decreased stance time - right;Decreased stride length;Decreased hip/knee flexion - right;Decreased dorsiflexion - right;Right genu recurvatum;Poor foot clearance - right    Ambulation Surface Level;Indoor  High Level Balance   High Level Balance Activities Turns    High Level Balance Comments in // bars with black theraband around LE performed 10 reps resisted hip ER, step outs 90 deg to R x 10 reps and then to L x 10 reps with bilat UE support and then x 10 reps with one UE support - letting go with ipsilateral UE and rotating upper trunk.  Then performed 90 deg turns x 4 to R for full 360 deg and then 90 deg turns to L x 4 for 360 turn x 3 sets to each direction with UE support.  Cues for full weight shift after performing hip ER.  Decreased stance on RLE.  Performed all against resistance of black theraband and supervision      Therapeutic Activites    Therapeutic Activities Other Therapeutic  Activities    Other Therapeutic Activities Pt concerned about posture and feels like his shoulders and back round forwards more.  Pt noted to have increased tightness in pectoralis muscles.  Will begin to address more formally in therapy sessions.      Knee/Hip Exercises: Supine   Other Supine Knee/Hip Exercises Bilat clams against resistance of black theraband, 2 sets x 10 reps.  Then performed alternating LE fall out against resistance of black band x 10 reps, 2 sets    Other Supine Knee/Hip Exercises Alternating hip flexion marches against resistance of black theraband; 2 sets x 10 reps with second set stabilizing foot on more compliant blue foam                    PT Short Term Goals - 01/14/20 2114      PT SHORT TERM GOAL #1   Title Patient will demonstrate independence with updated HEP    Time 4    Period Weeks    Status Revised    Target Date 02/05/20      PT SHORT TERM GOAL #2   Title Pt will improve gait velocity with cane to >/= 0.5 ft/sec    Time 4    Period Weeks    Status Revised    Target Date 02/05/20      PT SHORT TERM GOAL #3   Title Pt will improve BERG to >/= 42/56    Time 4    Period Weeks    Status Revised    Target Date 02/05/20      PT SHORT TERM GOAL #4   Title Pt will ambulate x 115' over flat, indoor surfaces with cane and with min A and intermittent standing rest breaks.    Time 4    Period Weeks    Status Revised    Target Date 02/05/20      PT SHORT TERM GOAL #5   Target Date 12/03/19             PT Long Term Goals - 01/14/20 2116      PT LONG TERM GOAL #1   Title Pt will demonstrate independence with land HEP    Time 8    Period Weeks    Status Revised    Target Date 03/08/20      PT LONG TERM GOAL #2   Title Pt will improve gait velocity with cane to >/= 0.8 ft/sec    Baseline .34 ft/sec with cane    Time 8    Period Weeks    Status Revised    Target Date 03/08/20  PT LONG TERM GOAL #3   Title Pt will  improve BERG to >/= 45/56 to decrease falls risk    Baseline 39/56 on 01/08/20    Time 8    Period Weeks    Status Revised    Target Date 03/08/20      PT LONG TERM GOAL #4   Title Pt will ambulate x 200' outside over pavement and grass with supervision and cane to improve safety ambulating to wood working shop at home    Time 8    Period Weeks    Status Revised    Target Date 03/08/20                 Plan - 01/29/20 0945    Clinical Impression Statement Utilized supine resisted mat rotation exercises and resisted exercises in standing to focus on strengthening hip rotator muscles and focus on balance when performing turns to L and R.  Continues to have limited ability to stabilize on RLE and perform safe turn to L.  Pt also reports feeling more flexed in his posture; PT to begin to more formally address during therapy sessions.    Comorbidities multiple falls, vitamin D insufficiency, Type 1 IDDM, diabetic retinopathy, hypothyroidism, HTN, hyperlipidemia, R tibia fracture, L ankle fracture with ORIF    Rehab Potential Good    PT Frequency 2x / week    PT Duration 8 weeks    PT Treatment/Interventions ADLs/Self Care Home Management;Aquatic Therapy;Electrical Stimulation;DME Instruction;Gait training;Stair training;Functional mobility training;Therapeutic activities;Therapeutic exercise;Balance training;Neuromuscular re-education;Patient/family education;Orthotic Fit/Training;Passive range of motion;Energy conservation    PT Next Visit Plan Focus on and add to HEP - pectoralis stretches and strengthening for posture.  Hip rotation strengthening and working on pivoting/turning.  Gait with crutches; cane when appropriate    PT Home Exercise Plan Revised HEP on 3/18    Consulted and Agree with Plan of Care Patient           Patient will benefit from skilled therapeutic intervention in order to improve the following deficits and impairments:  Abnormal gait, Decreased balance,  Decreased endurance, Decreased coordination, Decreased range of motion, Decreased strength, Difficulty walking, Impaired tone, Postural dysfunction, Decreased activity tolerance, Decreased mobility  Visit Diagnosis: Unsteadiness on feet  Difficulty in walking, not elsewhere classified  Other abnormalities of gait and mobility  Muscle weakness (generalized)  Foot drop, right  Repeated falls     Problem List Patient Active Problem List   Diagnosis Date Noted  . Gait disturbance 08/19/2019  . Acquired diplegia ([place]) 08/19/2019  . Vitamin D insufficiency 11/29/2016  . Hypogonadism in male 11/01/2016  . 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[place])   . Hypothyroidism   . Hypertension   . Hyperlipidemia   . Closed fracture of right fibula and tibia 10/29/2016  . Closed tibia fracture 10/29/2016  . Closed fracture of right tibial plafond with fibula involvement 10/29/2016  . Abnormal liver function tests 03/06/2013  . RASH AND OTHER NONSPECIFIC SKIN ERUPTION 10/07/2009  . SHOULDER PAIN, LEFT 12/27/2008  . Type I (juvenile type) diabetes mellitus without mention of complication, uncontrolled 12/03/2007  . HYPOTHYROIDISM 11/12/2007  . HYPERCHOLESTEROLEMIA 11/12/2007  . MULTIPLE SCLEROSIS 08/29/2007  . PROLIFERATIVE DIABETIC RETINOPATHY 08/29/2007  . HYPERTENSION 12/25/2006   [name], PT, DPT 01/29/20    9:48 AM    [facility] [address] [place], [place], [address] Phone: [phone]   Fax:  [phone]  Name: Johnathan Ross  MRN: [id number] Date of Birth: 05/22/1971

## 2020-02-03 ENCOUNTER — Encounter: Payer: Self-pay | Admitting: Physical Therapy

## 2020-02-03 ENCOUNTER — Other Ambulatory Visit: Payer: Self-pay

## 2020-02-03 ENCOUNTER — Ambulatory Visit: Payer: Medicare Other | Admitting: Physical Therapy

## 2020-02-03 DIAGNOSIS — R262 Difficulty in walking, not elsewhere classified: Secondary | ICD-10-CM | POA: Diagnosis not present

## 2020-02-03 DIAGNOSIS — M21371 Foot drop, right foot: Secondary | ICD-10-CM | POA: Diagnosis not present

## 2020-02-03 DIAGNOSIS — R296 Repeated falls: Secondary | ICD-10-CM | POA: Diagnosis not present

## 2020-02-03 DIAGNOSIS — M6281 Muscle weakness (generalized): Secondary | ICD-10-CM | POA: Diagnosis not present

## 2020-02-03 DIAGNOSIS — R2681 Unsteadiness on feet: Secondary | ICD-10-CM | POA: Diagnosis not present

## 2020-02-03 DIAGNOSIS — R2689 Other abnormalities of gait and mobility: Secondary | ICD-10-CM | POA: Diagnosis not present

## 2020-02-03 NOTE — Patient Instructions (Signed)
Access Code: [email or web address] URL: [email or web address] Date: 02/03/2020 Prepared by: Willow Ora  Exercises Child's Pose Stretch - 1 x daily - 7 x weekly - 3 sets - 20 second hold Quadruped Hip Extension Kicks - 1 x daily - 5 x weekly - 5 reps - 4 sets Heel Sits - 1 x daily - 5 x weekly - 12 reps - 1 sets Tall Kneeling Diagonal Lift with Medicine Ball - 1 x daily - 5 x weekly - 5-10 reps - 1 sets Hooklying Isometric Hip Flexion - 1 x daily - 7 x weekly - 10 reps - 3 sets Sidelying Bent Knee Lift at 45 Degrees - 1 x daily - 7 x weekly - 3 sets - 5 reps Supine Chest Stretch on Foam Roll - 1 x daily - 5 x weekly - 1 sets - 1 reps - 3 hold Supine Thoracic Mobilization Towel Roll Vertical with Arm Stretch - 1 x daily - 5 x weekly - 1 sets - 10 reps Standing with Back Flat Against Wall - 1 x daily - 5 x weekly - 1 sets - 10 reps - 10 hold

## 2020-02-04 NOTE — Therapy (Signed)
[facility] [address] [place], [place], [address] Phone: [phone]   Fax:  [phone]  Physical Therapy Treatment  Patient Details  Name: Johnathan Ross MRN: [id number] Date of Birth: 13-Oct-1970 Referring Provider (PT): Sater, Nanine Means, MD (previously Dr. Bjorn Loser but pt has changed MS care to [facility])   Encounter Date: 02/03/2020   PT End of Session - 02/03/20 0853    Visit Number 75    Number of Visits 93    Date for PT Re-Evaluation 03/08/20    Authorization Type Medicare Part A and B; 10th visit PN    Progress Note Due on Visit 44    PT Start Time 0848    PT Stop Time 0930    PT Time Calculation (min) 42 min    Equipment Utilized During Treatment Gait belt    Activity Tolerance Patient tolerated treatment well    Behavior During Therapy WFL for tasks assessed/performed           Past Medical History:  Diagnosis Date  . Hyperlipidemia   . Hypertension   . Hypogonadism in male 11/01/2016  . Hypothyroidism   . Multiple sclerosis ([place])   . Proliferative diabetic retinopathy(362.02)   . Type 1 diabetes mellitus ([place]) dx'd 1994  . Vitamin D insufficiency 11/29/2016    Past Surgical History:  Procedure Laterality Date  . EYE SURGERY Bilateral    "laser for diabetic retinopathy"  . FRACTURE SURGERY    . OPEN REDUCTION INTERNAL FIXATION (ORIF) TIBIA/FIBULA FRACTURE Right 10/30/2016  . ORIF ANKLE FRACTURE Left 2015  . ORIF TIBIA FRACTURE Right 10/30/2016   Procedure: OPEN REDUCTION INTERNAL FIXATION (ORIF) TIBIA FIBULA FRACTURE;  Surgeon: Altamese [place]ey[place], MD;  Location: [facility];  Service: Orthopedics;  Laterality: Right;  . RETINAL LASER PROCEDURE Bilateral    "for diabetic retinopathy"    There were no vitals filed for this visit.   Subjective Assessment - 02/03/20 0852    Subjective Now taking 3 baclofen pills 3x a day. Not getting too sleepy and not sure about a difference in tone/spasms. No falls or pain to  report.    Patient is accompained by: Family member   mom in car   Pertinent History multiple falls, vitamin D insufficiency, Type 1 IDDM, diabetic retinopathy, hypothyroidism, HTN, hyperlipidemia, R tibia fracture, L ankle fracture with ORIF    Diagnostic tests No new lesions seen on MRI    Patient Stated Goals To improve walking    Currently in Pain? No/denies    Pain Score 0-No pain            Treatment: Session focused on review and revisement of HEP. Issued the following to HEP today. Cues needed on form and technique. Min guard assist with standing ex's for safety.    Access Code: [email or web address] URL: [email or web address] Date: 02/03/2020 Prepared by: Willow Ora  Exercises Child's Pose Stretch - 1 x daily - 7 x weekly - 3 sets - 20 second hold Quadruped Hip Extension Kicks - 1 x daily - 5 x weekly - 5 reps - 4 sets Heel Sits - 1 x daily - 5 x weekly - 12 reps - 1 sets Tall Kneeling Diagonal Lift with Medicine Ball - 1 x daily - 5 x weekly - 5-10 reps - 1 sets Hooklying Isometric Hip Flexion - 1 x daily - 7 x weekly - 10 reps - 3 sets Sidelying Bent Knee Lift at 45 Degrees - 1 x daily - 7 x  weekly - 3 sets - 5 reps Supine Chest Stretch on Foam Roll - 1 x daily - 5 x weekly - 1 sets - 1 reps - 3 hold Supine Thoracic Mobilization Towel Roll Vertical with Arm Stretch - 1 x daily - 5 x weekly - 1 sets - 10 reps (includes 3 way band pulls in written directions) Standing with Back Flat Against Wall - 1 x daily - 5 x weekly - 1 sets - 10 reps - 10 hold (standing at doorframe reaching up and back is in written directions)         PT Education - 02/03/20 0927    Education Details updated and advanced HEP to include pec stretching and postural strengthening.    Person(s) Educated Patient    Methods Explanation;Demonstration;Tactile cues;Verbal cues;Handout    Comprehension Verbalized understanding;Returned demonstration;Verbal cues required;Tactile cues required;Need  further instruction            PT Short Term Goals - 01/14/20 2114      PT SHORT TERM GOAL #1   Title Patient will demonstrate independence with updated HEP    Time 4    Period Weeks    Status Revised    Target Date 02/05/20      PT SHORT TERM GOAL #2   Title Pt will improve gait velocity with cane to >/= 0.5 ft/sec    Time 4    Period Weeks    Status Revised    Target Date 02/05/20      PT SHORT TERM GOAL #3   Title Pt will improve BERG to >/= 42/56    Time 4    Period Weeks    Status Revised    Target Date 02/05/20      PT SHORT TERM GOAL #4   Title Pt will ambulate x 115' over flat, indoor surfaces with cane and with min A and intermittent standing rest breaks.    Time 4    Period Weeks    Status Revised    Target Date 02/05/20      PT SHORT TERM GOAL #5   Target Date 12/03/19             PT Long Term Goals - 01/14/20 2116      PT LONG TERM GOAL #1   Title Pt will demonstrate independence with land HEP    Time 8    Period Weeks    Status Revised    Target Date 03/08/20      PT LONG TERM GOAL #2   Title Pt will improve gait velocity with cane to >/= 0.8 ft/sec    Baseline .34 ft/sec with cane    Time 8    Period Weeks    Status Revised    Target Date 03/08/20      PT LONG TERM GOAL #3   Title Pt will improve BERG to >/= 45/56 to decrease falls risk    Baseline 39/56 on 01/08/20    Time 8    Period Weeks    Status Revised    Target Date 03/08/20      PT LONG TERM GOAL #4   Title Pt will ambulate x 200' outside over pavement and grass with supervision and cane to improve safety ambulating to wood working shop at home    Time 8    Period Weeks    Status Revised    Target Date 03/08/20  Plan - 02/03/20 0853    Clinical Impression Statement Today's skilled session focused on review and revisement of pt's HEP. No issues reported or noted with performance in session today. The pt is progressing toward goals and should  benefit from continued PT to progress toward unmet goals.    Comorbidities multiple falls, vitamin D insufficiency, Type 1 IDDM, diabetic retinopathy, hypothyroidism, HTN, hyperlipidemia, R tibia fracture, L ankle fracture with ORIF    Rehab Potential Good    PT Frequency 2x / week    PT Duration 8 weeks    PT Treatment/Interventions ADLs/Self Care Home Management;Aquatic Therapy;Electrical Stimulation;DME Instruction;Gait training;Stair training;Functional mobility training;Therapeutic activities;Therapeutic exercise;Balance training;Neuromuscular re-education;Patient/family education;Orthotic Fit/Training;Passive range of motion;Energy conservation    PT Next Visit Plan Hip rotation strengthening and working on pivoting/turning.  Gait with crutches; cane when appropriate    PT Home Exercise Plan Access Code: [email or web address] (updated on 02/03/20)    Consulted and Agree with Plan of Care Patient           Patient will benefit from skilled therapeutic intervention in order to improve the following deficits and impairments:  Abnormal gait, Decreased balance, Decreased endurance, Decreased coordination, Decreased range of motion, Decreased strength, Difficulty walking, Impaired tone, Postural dysfunction, Decreased activity tolerance, Decreased mobility  Visit Diagnosis: Unsteadiness on feet  Difficulty in walking, not elsewhere classified  Other abnormalities of gait and mobility     Problem List Patient Active Problem List   Diagnosis Date Noted  . Gait disturbance 08/19/2019  . Acquired diplegia ([place]) 08/19/2019  . Vitamin D insufficiency 11/29/2016  . Hypogonadism in male 11/01/2016  . 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[place])   . Hypothyroidism   . Hypertension   . Hyperlipidemia   . Closed fracture of right fibula and tibia 10/29/2016  . Closed tibia fracture 10/29/2016  . Closed fracture of right tibial plafond with fibula involvement 10/29/2016  . Abnormal liver  function tests 03/06/2013  . RASH AND OTHER NONSPECIFIC SKIN ERUPTION 10/07/2009  . SHOULDER PAIN, LEFT 12/27/2008  . Type I (juvenile type) diabetes mellitus without mention of complication, uncontrolled 12/03/2007  . HYPOTHYROIDISM 11/12/2007  . HYPERCHOLESTEROLEMIA 11/12/2007  . MULTIPLE SCLEROSIS 08/29/2007  . PROLIFERATIVE DIABETIC RETINOPATHY 08/29/2007  . HYPERTENSION 12/25/2006    Willow Ora, PTA, [facility] [address] [place], [place] [address] [phone] 02/04/20, 4:46 PM   Name: HARVIE MORUA MRN: [id number] Date of Birth: [birth year]

## 2020-02-05 ENCOUNTER — Other Ambulatory Visit: Payer: Self-pay

## 2020-02-05 ENCOUNTER — Ambulatory Visit: Payer: Medicare Other | Admitting: Physical Therapy

## 2020-02-05 ENCOUNTER — Encounter: Payer: Self-pay | Admitting: Physical Therapy

## 2020-02-05 DIAGNOSIS — R2681 Unsteadiness on feet: Secondary | ICD-10-CM

## 2020-02-05 DIAGNOSIS — R262 Difficulty in walking, not elsewhere classified: Secondary | ICD-10-CM | POA: Diagnosis not present

## 2020-02-05 DIAGNOSIS — R2689 Other abnormalities of gait and mobility: Secondary | ICD-10-CM

## 2020-02-05 DIAGNOSIS — M6281 Muscle weakness (generalized): Secondary | ICD-10-CM | POA: Diagnosis not present

## 2020-02-05 DIAGNOSIS — M21371 Foot drop, right foot: Secondary | ICD-10-CM

## 2020-02-05 DIAGNOSIS — R296 Repeated falls: Secondary | ICD-10-CM

## 2020-02-05 NOTE — Patient Instructions (Addendum)
Access Code: [email or web address] URL: [email or web address] Date: 02/05/2020 Prepared by: Misty Stanley  Exercises Child's Pose Stretch - 1 x daily - 7 x weekly - 3 sets - 20 second hold Quadruped Hip Extension Kicks - 1 x daily - 5 x weekly - 5 reps - 4 sets Heel Sits - 1 x daily - 5 x weekly - 12 reps - 1 sets Tall Kneeling Diagonal Lift with Medicine Ball - 1 x daily - 5 x weekly - 5-10 reps - 1 sets Hooklying Isometric Hip Flexion - 1 x daily - 7 x weekly - 10 reps - 3 sets Sidelying Bent Knee Lift at 45 Degrees - 1 x daily - 7 x weekly - 3 sets - 5 reps Standing with Back Flat Against Wall - 1 x daily - 5 x weekly - 1 sets - 10 reps - 10 hold Supine Thoracic Mobilization Towel Roll Vertical with Arm Stretch - 1 x daily - 5 x weekly - 1 sets - 2 minutes hold Supine Shoulder Horizontal Abduction with Resistance - 1 x daily - 7 x weekly - 1 sets - 10 reps Supine Shoulder External Rotation with Dumbbell - 1 x daily - 7 x weekly - 2 sets - 10 reps

## 2020-02-05 NOTE — Therapy (Signed)
[facility] [address] [place], [place], [address] Phone: [phone]   Fax:  [phone]  Physical Therapy Treatment  Patient Details  Name: Johnathan Ross MRN: [id number] Date of Birth: Nov 28, 1970 Referring Provider (PT): Sater, Nanine Means, MD (previously Dr. Bjorn Loser but pt has changed MS care to [facility])   Encounter Date: 02/05/2020   PT End of Session - 02/05/20 2132    Visit Number 75    Number of Visits 93    Date for PT Re-Evaluation 03/08/20    Authorization Type Medicare Part A and B; 10th visit PN    Progress Note Due on Visit 90    PT Start Time 0850    PT Stop Time 0934    PT Time Calculation (min) 44 min    Activity Tolerance Patient tolerated treatment well    Behavior During Therapy [facility] for tasks assessed/performed           Past Medical History:  Diagnosis Date   Hyperlipidemia    Hypertension    Hypogonadism in male 11/01/2016   Hypothyroidism    Multiple sclerosis ([place])    Proliferative diabetic retinopathy(362.02)    Type 1 diabetes mellitus ([place]) dx'd 1994   Vitamin D insufficiency 11/29/2016    Past Surgical History:  Procedure Laterality Date   EYE SURGERY Bilateral    "laser for diabetic retinopathy"   FRACTURE SURGERY     OPEN REDUCTION INTERNAL FIXATION (ORIF) TIBIA/FIBULA FRACTURE Right 10/30/2016   ORIF ANKLE FRACTURE Left 2015   ORIF TIBIA FRACTURE Right 10/30/2016   Procedure: OPEN REDUCTION INTERNAL FIXATION (ORIF) TIBIA FIBULA FRACTURE;  Surgeon: Altamese [place], MD;  Location: [facility];  Service: Orthopedics;  Laterality: Right;   RETINAL LASER PROCEDURE Bilateral    "for diabetic retinopathy"    There were no vitals filed for this visit.   Subjective Assessment - 02/05/20 0855    Subjective Did the whole pack of exercises yesterday; doesn't think the green band is strong enough.    Patient is accompained by: Family member   mom in car   Pertinent History multiple  falls, vitamin D insufficiency, Type 1 IDDM, diabetic retinopathy, hypothyroidism, HTN, hyperlipidemia, R tibia fracture, L ankle fracture with ORIF    Diagnostic tests No new lesions seen on MRI    Patient Stated Goals To improve walking    Currently in Pain? No/denies           Reviewed patient's postural HEP stretches and updated theraband to black.  Also added shoulder and scapular strengthening exercises below in bold.  Reviewed hip flexion exercise for improved core and hip flexor activation.  Access Code: [email or web address] URL: [email or web address] Date: 02/05/2020 Prepared by: Misty Stanley  Exercises Child's Pose Stretch - 1 x daily - 7 x weekly - 3 sets - 20 second hold Quadruped Hip Extension Kicks - 1 x daily - 5 x weekly - 5 reps - 4 sets Heel Sits - 1 x daily - 5 x weekly - 12 reps - 1 sets Tall Kneeling Diagonal Lift with Medicine Ball - 1 x daily - 5 x weekly - 5-10 reps - 1 sets Hooklying Isometric Hip Flexion - 1 x daily - 7 x weekly - 10 reps - 3 sets Sidelying Bent Knee Lift at 45 Degrees - 1 x daily - 7 x weekly - 3 sets - 5 reps Standing with Back Flat Against Wall - 1 x daily - 5 x weekly - 1 sets - 10  reps - 10 hold Supine Thoracic Mobilization Towel Roll Vertical with Arm Stretch - 1 x daily - 5 x weekly - 1 sets - 2 minutes hold Supine Shoulder Horizontal Abduction with Resistance - 1 x daily - 7 x weekly - 1 sets - 10 reps - with Black theraband Supine Shoulder External Rotation with Dumbbell - 1 x daily - 7 x weekly - 2 sets - 10 reps - 5lb        PT Education - 02/05/20 2131    Education Details added strengthening to postural HEP    Person(s) Educated Patient    Methods Explanation;Demonstration;Handout    Comprehension Verbalized understanding;Returned demonstration            PT Short Term Goals - 01/14/20 2114      PT SHORT TERM GOAL #1   Title Patient will demonstrate independence with updated HEP    Time 4    Period Weeks     Status Revised    Target Date 02/05/20      PT SHORT TERM GOAL #2   Title Pt will improve gait velocity with cane to >/= 0.5 ft/sec    Time 4    Period Weeks    Status Revised    Target Date 02/05/20      PT SHORT TERM GOAL #3   Title Pt will improve BERG to >/= 42/56    Time 4    Period Weeks    Status Revised    Target Date 02/05/20      PT SHORT TERM GOAL #4   Title Pt will ambulate x 115' over flat, indoor surfaces with cane and with min A and intermittent standing rest breaks.    Time 4    Period Weeks    Status Revised    Target Date 02/05/20      PT SHORT TERM GOAL #5   Target Date 12/03/19             PT Long Term Goals - 01/14/20 2116      PT LONG TERM GOAL #1   Title Pt will demonstrate independence with land HEP    Time 8    Period Weeks    Status Revised    Target Date 03/08/20      PT LONG TERM GOAL #2   Title Pt will improve gait velocity with cane to >/= 0.8 ft/sec    Baseline .34 ft/sec with cane    Time 8    Period Weeks    Status Revised    Target Date 03/08/20      PT LONG TERM GOAL #3   Title Pt will improve BERG to >/= 45/56 to decrease falls risk    Baseline 39/56 on 01/08/20    Time 8    Period Weeks    Status Revised    Target Date 03/08/20      PT LONG TERM GOAL #4   Title Pt will ambulate x 200' outside over pavement and grass with supervision and cane to improve safety ambulating to wood working shop at home    Time 8    Period Weeks    Status Revised    Target Date 03/08/20                 Plan - 02/05/20 2133    Clinical Impression Statement Continued to address patient's posture by reviewing stretches and adding to HEP scapular and shoulder strengthening exercises with theraband and hand weights.  Updated theraband to black theraband.  Will continue to address and progress towards LTG.    Comorbidities multiple falls, vitamin D insufficiency, Type 1 IDDM, diabetic retinopathy, hypothyroidism, HTN, hyperlipidemia,  R tibia fracture, L ankle fracture with ORIF    Rehab Potential Good    PT Frequency 2x / week    PT Duration 8 weeks    PT Treatment/Interventions ADLs/Self Care Home Management;Aquatic Therapy;Electrical Stimulation;DME Instruction;Gait training;Stair training;Functional mobility training;Therapeutic activities;Therapeutic exercise;Balance training;Neuromuscular re-education;Patient/family education;Orthotic Fit/Training;Passive range of motion;Energy conservation    PT Next Visit Plan CHECK STG.  Postural work.  Hip rotation strengthening and working on pivoting/turning.  Gait with crutches; cane when appropriate    PT Home Exercise Plan Access Code: [email or web address] (updated on 02/05/20)    Consulted and Agree with Plan of Care Patient           Patient will benefit from skilled therapeutic intervention in order to improve the following deficits and impairments:  Abnormal gait, Decreased balance, Decreased endurance, Decreased coordination, Decreased range of motion, Decreased strength, Difficulty walking, Impaired tone, Postural dysfunction, Decreased activity tolerance, Decreased mobility  Visit Diagnosis: Unsteadiness on feet  Difficulty in walking, not elsewhere classified  Other abnormalities of gait and mobility  Muscle weakness (generalized)  Repeated falls  Foot drop, right     Problem List Patient Active Problem List   Diagnosis Date Noted   Gait disturbance 08/19/2019   Acquired diplegia ([place]) 08/19/2019   Vitamin D insufficiency 11/29/2016   Hypogonadism in male 11/01/2016   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[place])    Hypothyroidism    Hypertension    Hyperlipidemia    Closed fracture of right fibula and tibia 10/29/2016   Closed tibia fracture 10/29/2016   Closed fracture of right tibial plafond with fibula involvement 10/29/2016   Abnormal liver function tests 03/06/2013   RASH AND OTHER NONSPECIFIC SKIN ERUPTION 10/07/2009    SHOULDER PAIN, LEFT 12/27/2008   Type I (juvenile type) diabetes mellitus without mention of complication, uncontrolled 12/03/2007   HYPOTHYROIDISM 11/12/2007   HYPERCHOLESTEROLEMIA 11/12/2007   MULTIPLE SCLEROSIS 08/29/2007   PROLIFERATIVE DIABETIC RETINOPATHY 08/29/2007   HYPERTENSION 12/25/2006    [name], PT, DPT 02/05/20    9:43 PM    [facility] [address] [place], [place], [address] Phone: [phone]   Fax:  [phone]  Name: Johnathan Ross MRN: [id number] Date of Birth: October 30, 1970

## 2020-02-08 ENCOUNTER — Other Ambulatory Visit: Payer: Self-pay

## 2020-02-08 ENCOUNTER — Ambulatory Visit (INDEPENDENT_AMBULATORY_CARE_PROVIDER_SITE_OTHER): Payer: Medicare Other | Admitting: Family

## 2020-02-08 VITALS — BP 140/70 | HR 78 | Temp 98.0°F | Ht 73.0 in | Wt 195.4 lb

## 2020-02-08 DIAGNOSIS — I82622 Acute embolism and thrombosis of deep veins of left upper extremity: Secondary | ICD-10-CM

## 2020-02-08 NOTE — Progress Notes (Signed)
Johnathan Ross is a 49 y.o. male with the following history as recorded in EpicCare:  Patient Active Problem List   Diagnosis Date Noted  . Gait disturbance 08/19/2019  . Acquired diplegia ([place]) 08/19/2019  . Vitamin D insufficiency 11/29/2016  . Hypogonadism in male 11/01/2016  . 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 Hypothyroidism   . Hypertension   . Hyperlipidemia   . Closed fracture of right fibula and tibia 10/29/2016  . Closed tibia fracture 10/29/2016  . Closed fracture of right tibial plafond with fibula involvement 10/29/2016  . Abnormal liver function tests 03/06/2013  . RASH AND OTHER NONSPECIFIC SKIN ERUPTION 10/07/2009  . SHOULDER PAIN, LEFT 12/27/2008  . Type I (juvenile type) diabetes mellitus without mention of complication, uncontrolled 12/03/2007  . HYPOTHYROIDISM 11/12/2007  . HYPERCHOLESTEROLEMIA 11/12/2007  . MULTIPLE SCLEROSIS 08/29/2007  . PROLIFERATIVE DIABETIC RETINOPATHY 08/29/2007  . HYPERTENSION 12/25/2006    Current Outpatient Medications  Medication Sig Dispense Refill  . baclofen (LIORESAL) 10 MG tablet Take one or two pills three times a day (up to 6 pills/day) 180 each 11  . dalfampridine (AMPYRA) 10 MG TB12 Take 1 tablet (10 mg total) by mouth 2 (two) times daily. 180 tablet 3  . glucose blood (CONTOUR NEXT TEST) test strip USE AS INSTRUCTED TO CHECK BLOOD SUGARS 8 TIMES DAILY 300 strip 3  . insulin aspart (NOVOLOG) 100 UNIT/ML injection USE UP TO 80 UNITS IN INSULIN PUMP DAILY-DX CODE E10.8 80 mL 2  . lisinopril (ZESTRIL) 10 MG tablet Take 1 tablet (10 mg total) by mouth daily. 90 tablet 0  . simvastatin (ZOCOR) 20 MG tablet Take 1 tablet (20 mg total) by mouth at bedtime. 90 tablet 0  . SYNTHROID 125 MCG tablet TAKE 1 TABLET BY MOUTH 6 DAYS PER WEEK 72 tablet 1   No current facility-administered medications for this visit.    Allergies: Patient has no known allergies.  Past Medical History:  Diagnosis Date  .  Hyperlipidemia   . Hypertension   . Hypogonadism in male 11/01/2016  . Hypothyroidism   . Multiple sclerosis ([place])   . Proliferative diabetic retinopathy(362.02)   . Type 1 diabetes mellitus ([place]) dx'd 1994  . Vitamin D insufficiency 11/29/2016    Past Surgical History:  Procedure Laterality Date  . EYE SURGERY Bilateral    "laser for diabetic retinopathy"  . FRACTURE SURGERY    . OPEN REDUCTION INTERNAL FIXATION (ORIF) TIBIA/FIBULA FRACTURE Right 10/30/2016  . ORIF ANKLE FRACTURE Left 2015  . ORIF TIBIA FRACTURE Right 10/30/2016   Procedure: OPEN REDUCTION INTERNAL FIXATION (ORIF) TIBIA FIBULA FRACTURE;  Surgeon: Altamese [place]ona, MD;  Location: [facility];  Service: Orthopedics;  Laterality: Right;  . RETINAL LASER PROCEDURE Bilateral    "for diabetic retinopathy"    Family History  Problem Relation Age of Onset  . Diabetes Sister   . Colon cancer Maternal Grandmother   . Colon polyps Maternal Uncle     Social History   Tobacco Use  . Smoking status: Never Smoker  . Smokeless tobacco: Never Used  Substance Use Topics  . Alcohol use: No    Subjective:  Accompanied by sister;  History of DVT in September 2020- treated with Xarelto; saw vascular specialist in November 2020 who recommended treatment with 3-6 months of anticoagulation and then referral to hematology. Similar discussion had at our office visit in September 2020- recommended follow-up in December 2020 for repeat vascular ultrasound;  Patient completed 3 months of  Xarelto but no follow-up visit here.   Objective:  Vitals:   02/08/20 0917  BP: 140/70  Pulse: 78  Temp: 98 F (36.7 C)  TempSrc: Oral  SpO2: 98%  Weight: 195 lb 6.4 oz (88.6 kg)  Height: 6\' 1"  (1.854 m)    General: Well developed, well nourished, in no acute distress  Skin : Warm and dry.  Head: Normocephalic and atraumatic  Lungs: Respirations unlabored; Vessels: Symmetric bilaterally  Neurologic: Alert and oriented; speech intact; face symmetrical;    Assessment:  1. Deep vein thrombosis (DVT) of left upper extremity, unspecified chronicity, unspecified vein (HCC)     Plan:  Unfortunately, there seems to have been confusion/ misunderstanding on the patient's part regarding follow-up; in reviewing records, the Xarelto was written for 3 months with recommendation form our office to return for follow-up in December to re-check vascular ultrasound/ discuss referral to hematology; he was also told  In January 2021 to call his vascular doctor when he called back with continued concerns about the swelling- he agreed to this but I do not see that he ever reached out to call them in follow-up.  There was anger and frustration expressed by the patient and his sister as to why I had asked him to schedule a 1 year follow-up in 01/2019 only to be told today that he wasn't eligible for a yearly CPE with medicare; I explained that his chronic healthcare needs were being managed very well by his specialists and I did not need to order more labs for him today.   As the visit progressed, it became apparent that I could not make the patient or his sister happy and the situation could not be de-escalated. I then explained our visit was over and showed them how to leave our office and indicated that the referrals had been done and he would need to find a new PCP.   Time spent 30 minutes   No follow-ups on file.  Orders Placed This Encounter  Procedures  . Ambulatory referral to Hematology    Referral Priority:   Routine    Referral Type:   Consultation    Referral Reason:   Specialty Services Required    Requested Specialty:   Oncology    Number of Visits Requested:   1    Requested Prescriptions    No prescriptions requested or ordered in this encounter

## 2020-02-09 ENCOUNTER — Other Ambulatory Visit: Payer: Self-pay | Admitting: Endocrinology

## 2020-02-09 DIAGNOSIS — E782 Mixed hyperlipidemia: Secondary | ICD-10-CM

## 2020-02-09 DIAGNOSIS — I10 Essential (primary) hypertension: Secondary | ICD-10-CM

## 2020-02-10 ENCOUNTER — Encounter: Payer: Self-pay | Admitting: Physical Therapy

## 2020-02-10 ENCOUNTER — Other Ambulatory Visit: Payer: Self-pay

## 2020-02-10 ENCOUNTER — Ambulatory Visit: Payer: Medicare Other | Admitting: Physical Therapy

## 2020-02-10 DIAGNOSIS — M21371 Foot drop, right foot: Secondary | ICD-10-CM

## 2020-02-10 DIAGNOSIS — R2681 Unsteadiness on feet: Secondary | ICD-10-CM | POA: Diagnosis not present

## 2020-02-10 DIAGNOSIS — R2689 Other abnormalities of gait and mobility: Secondary | ICD-10-CM | POA: Diagnosis not present

## 2020-02-10 DIAGNOSIS — R262 Difficulty in walking, not elsewhere classified: Secondary | ICD-10-CM | POA: Diagnosis not present

## 2020-02-10 DIAGNOSIS — R296 Repeated falls: Secondary | ICD-10-CM

## 2020-02-10 DIAGNOSIS — M6281 Muscle weakness (generalized): Secondary | ICD-10-CM | POA: Diagnosis not present

## 2020-02-10 NOTE — Therapy (Signed)
[facility] [address] [place], [place], [address] Phone: [phone]   Fax:  [phone]  Physical Therapy Treatment  Patient Details  Name: Johnathan Ross MRN: [id number] Date of Birth: Oct 04, 1970 Referring Provider (PT): Sater, Nanine Means, MD (previously Dr. Bjorn Loser but pt has changed MS care to [facility])   Encounter Date: 02/10/2020   PT End of Session - 02/10/20 0945    Visit Number 86    Number of Visits 93    Date for PT Re-Evaluation 03/08/20    Authorization Type Medicare Part A and B; 10th visit PN    Progress Note Due on Visit 12    PT Start Time 0846    PT Stop Time 0926    PT Time Calculation (min) 40 min    Activity Tolerance Patient tolerated treatment well    Behavior During Therapy [facility] for tasks assessed/performed           Past Medical History:  Diagnosis Date  . Hyperlipidemia   . Hypertension   . Hypogonadism in male 11/01/2016  . Hypothyroidism   . Multiple sclerosis ([place])   . Proliferative diabetic retinopathy(362.02)   . Type 1 diabetes mellitus ([place]) dx'd 1994  . Vitamin D insufficiency 11/29/2016    Past Surgical History:  Procedure Laterality Date  . EYE SURGERY Bilateral    "laser for diabetic retinopathy"  . FRACTURE SURGERY    . OPEN REDUCTION INTERNAL FIXATION (ORIF) TIBIA/FIBULA FRACTURE Right 10/30/2016  . ORIF ANKLE FRACTURE Left 2015  . ORIF TIBIA FRACTURE Right 10/30/2016   Procedure: OPEN REDUCTION INTERNAL FIXATION (ORIF) TIBIA FIBULA FRACTURE;  Surgeon: Altamese Ida, MD;  Location: [facility];  Service: Orthopedics;  Laterality: Right;  . RETINAL LASER PROCEDURE Bilateral    "for diabetic retinopathy"    There were no vitals filed for this visit.   Subjective Assessment - 02/10/20 0854    Subjective Had appointment with PCP but it didn't go well, is going to find a new PCP.  Has a couple questions about exercises.    Patient is accompained by: Family member   mom in car    Pertinent History multiple falls, vitamin D insufficiency, Type 1 IDDM, diabetic retinopathy, hypothyroidism, HTN, hyperlipidemia, R tibia fracture, L ankle fracture with ORIF    Diagnostic tests No new lesions seen on MRI    Patient Stated Goals To improve walking    Currently in Pain? No/denies              [facility] PT Assessment - 02/10/20 0924      Berg Balance Test   Sit to Stand Able to stand without using hands and stabilize independently    Standing Unsupported Able to stand safely 2 minutes    Sitting with Back Unsupported but Feet Supported on Floor or Stool Able to sit safely and securely 2 minutes    Standing Unsupported with Eyes Closed Able to stand 10 seconds safely    From Standing, Reach Forward with Outstretched Arm Can reach confidently >25 cm (10")    From Standing Position, Pick up Object from Floor Able to pick up shoe safely and easily    Berg comment: initiated BERG but unable to complete today due to needing to go to restroom; will perform on Friday.                         [facility] Adult PT Treatment/Exercise - 02/10/20 1791  Exercises   Exercises Other Exercises    Other Exercises  Reviewed HEP specifically hip ABD with LE at 45 deg and then supine shoulder IR/ER with hand weights.  For hip ABD reviewed use of visualization and then bilat activation pressing down into mat before activating top LE; pt performed 2 sets x 5 reps with improved activation and ability to maintain activation >3 seconds.  For shoulder IR/ER, with 5lb weights pt able to maintain 90 deg shoulder and elbow flexion but feels like 5lb is not enough resistance.  Trialed use of black theraband to perform but pt would have to switch body around or anchor point to be able to perform.  Increased amount of weight pt holding by putting both weights in one hand for total of 10lb.  Cued pt to maintain shoulder and elbow flexion at 90 deg to avoid compensatory use of elbow flex/extension              Access Code: [email or web address] URL: [email or web address] Date: 02/10/2020 Prepared by: Misty Stanley  Exercises Child's Pose Stretch - 1 x daily - 7 x weekly - 3 sets - 20 second hold Quadruped Hip Extension Kicks - 1 x daily - 5 x weekly - 5 reps - 4 sets Heel Sits - 1 x daily - 5 x weekly - 12 reps - 1 sets Tall Kneeling Diagonal Lift with Medicine Ball - 1 x daily - 5 x weekly - 5-10 reps - 1 sets Hooklying Isometric Hip Flexion - 1 x daily - 7 x weekly - 10 reps - 3 sets Sidelying Bent Knee Lift at 45 Degrees - 1 x daily - 7 x weekly - 3 sets - 5 reps -cues to visualize and then press LE apart Standing with Back Flat Against Wall - 1 x daily - 5 x weekly - 1 sets - 10 reps - 10 hold Supine Thoracic Mobilization Towel Roll Vertical with Arm Stretch - 1 x daily - 5 x weekly - 1 sets - 2 minutes hold Supine Shoulder Horizontal Abduction with Resistance - 1 x daily - 7 x weekly - 1 sets - 10 reps Supine Shoulder External Rotation with Dumbbell - 1 x daily - 7 x weekly - 2 sets - 10 reps - cued to hold both hand weights in one hand to increase weight resistance        PT Education - 02/10/20 0945    Education Details reviewed HEP and adjusted shoulder exercise; will finish assessment of STG next session    Person(s) Educated Patient    Methods Explanation;Demonstration    Comprehension Verbalized understanding;Returned demonstration            PT Short Term Goals - 02/10/20 0952      PT SHORT TERM GOAL #1   Title Patient will demonstrate independence with updated HEP    Time 4    Period Weeks    Status Achieved    Target Date 02/05/20      PT SHORT TERM GOAL #2   Title Pt will improve gait velocity with cane to >/= 0.5 ft/sec    Time 4    Period Weeks    Status Revised    Target Date 02/05/20      PT SHORT TERM GOAL #3   Title Pt will improve BERG to >/= 42/56    Time 4    Period Weeks    Status Revised    Target Date 02/05/20      PT SHORT  TERM GOAL #4   Title Pt will ambulate x 115' over flat, indoor surfaces with cane and with min A and intermittent standing rest breaks.    Time 4    Period Weeks    Status Revised    Target Date 02/05/20             PT Long Term Goals - 01/14/20 2116      PT LONG TERM GOAL #1   Title Pt will demonstrate independence with land HEP    Time 8    Period Weeks    Status Revised    Target Date 03/08/20      PT LONG TERM GOAL #2   Title Pt will improve gait velocity with cane to >/= 0.8 ft/sec    Baseline .34 ft/sec with cane    Time 8    Period Weeks    Status Revised    Target Date 03/08/20      PT LONG TERM GOAL #3   Title Pt will improve BERG to >/= 45/56 to decrease falls risk    Baseline 39/56 on 01/08/20    Time 8    Period Weeks    Status Revised    Target Date 03/08/20      PT LONG TERM GOAL #4   Title Pt will ambulate x 200' outside over pavement and grass with supervision and cane to improve safety ambulating to wood working shop at home    Time 8    Period Weeks    Status Revised    Target Date 03/08/20                 Plan - 02/10/20 0949    Clinical Impression Statement Initiated assessment of STG with ongoing review of HEP and answering questions about specific exercises, adjusting and progressing exercises.  Pt verbalized and return demonstrated.  Initiated assessment of standing balance with BERG but pt unable to complete today.  Will formally assess balance and gait with cane at next session.    Comorbidities multiple falls, vitamin D insufficiency, Type 1 IDDM, diabetic retinopathy, hypothyroidism, HTN, hyperlipidemia, R tibia fracture, L ankle fracture with ORIF    Rehab Potential Good    PT Frequency 2x / week    PT Duration 8 weeks    PT Treatment/Interventions ADLs/Self Care Home Management;Aquatic Therapy;Electrical Stimulation;DME Instruction;Gait training;Stair training;Functional mobility training;Therapeutic activities;Therapeutic  exercise;Balance training;Neuromuscular re-education;Patient/family education;Orthotic Fit/Training;Passive range of motion;Energy conservation    PT Next Visit Plan Finish STG - BERG and gait with cane.  Postural work.  Hip rotation strengthening and working on pivoting/turning.  Gait with crutches; cane when appropriate    PT Home Exercise Plan Access Code: [email or web address] (updated on 02/05/20)    Consulted and Agree with Plan of Care Patient           Patient will benefit from skilled therapeutic intervention in order to improve the following deficits and impairments:  Abnormal gait, Decreased balance, Decreased endurance, Decreased coordination, Decreased range of motion, Decreased strength, Difficulty walking, Impaired tone, Postural dysfunction, Decreased activity tolerance, Decreased mobility  Visit Diagnosis: Unsteadiness on feet  Difficulty in walking, not elsewhere classified  Other abnormalities of gait and mobility  Muscle weakness (generalized)  Repeated falls  Foot drop, right     Problem List Patient Active Problem List   Diagnosis Date Noted  . Gait disturbance 08/19/2019  . Acquired diplegia ([place]) 08/19/2019  . Vitamin D insufficiency 11/29/2016  . Hypogonadism in male 11/01/2016  .  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 Hypothyroidism   . Hypertension   . Hyperlipidemia   . Closed fracture of right fibula and tibia 10/29/2016  . Closed tibia fracture 10/29/2016  . Closed fracture of right tibial plafond with fibula involvement 10/29/2016  . Abnormal liver function tests 03/06/2013  . RASH AND OTHER NONSPECIFIC SKIN ERUPTION 10/07/2009  . SHOULDER PAIN, LEFT 12/27/2008  . Type I (juvenile type) diabetes mellitus without mention of complication, uncontrolled 12/03/2007  . HYPOTHYROIDISM 11/12/2007  . HYPERCHOLESTEROLEMIA 11/12/2007  . MULTIPLE SCLEROSIS 08/29/2007  . PROLIFERATIVE DIABETIC RETINOPATHY 08/29/2007  . HYPERTENSION  12/25/2006    [name], PT, DPT 02/10/20    9:54 AM    [facility] [address] [place], [place], [address] Phone: [phone]   Fax:  [phone]  Name: Johnathan Ross MRN: [id number] Date of Birth: [birth year]

## 2020-02-10 NOTE — Patient Instructions (Addendum)
Access Code: [email or web address] URL: [email or web address] Date: 02/10/2020 Prepared by: Misty Stanley  Exercises Child's Pose Stretch - 1 x daily - 7 x weekly - 3 sets - 20 second hold Quadruped Hip Extension Kicks - 1 x daily - 5 x weekly - 5 reps - 4 sets Heel Sits - 1 x daily - 5 x weekly - 12 reps - 1 sets Tall Kneeling Diagonal Lift with Medicine Ball - 1 x daily - 5 x weekly - 5-10 reps - 1 sets Hooklying Isometric Hip Flexion - 1 x daily - 7 x weekly - 10 reps - 3 sets Sidelying Bent Knee Lift at 45 Degrees - 1 x daily - 7 x weekly - 3 sets - 5 reps Standing with Back Flat Against Wall - 1 x daily - 5 x weekly - 1 sets - 10 reps - 10 hold Supine Thoracic Mobilization Towel Roll Vertical with Arm Stretch - 1 x daily - 5 x weekly - 1 sets - 2 minutes hold Supine Shoulder Horizontal Abduction with Resistance - 1 x daily - 7 x weekly - 1 sets - 10 reps Supine Shoulder External Rotation with Dumbbell - 1 x daily - 7 x weekly - 2 sets - 10 reps

## 2020-02-12 ENCOUNTER — Other Ambulatory Visit: Payer: Self-pay

## 2020-02-12 ENCOUNTER — Ambulatory Visit: Payer: Medicare Other | Admitting: Physical Therapy

## 2020-02-12 ENCOUNTER — Encounter: Payer: Self-pay | Admitting: Physical Therapy

## 2020-02-12 DIAGNOSIS — R296 Repeated falls: Secondary | ICD-10-CM

## 2020-02-12 DIAGNOSIS — M6281 Muscle weakness (generalized): Secondary | ICD-10-CM | POA: Diagnosis not present

## 2020-02-12 DIAGNOSIS — M21371 Foot drop, right foot: Secondary | ICD-10-CM | POA: Diagnosis not present

## 2020-02-12 DIAGNOSIS — R2681 Unsteadiness on feet: Secondary | ICD-10-CM

## 2020-02-12 DIAGNOSIS — R262 Difficulty in walking, not elsewhere classified: Secondary | ICD-10-CM

## 2020-02-12 DIAGNOSIS — R2689 Other abnormalities of gait and mobility: Secondary | ICD-10-CM

## 2020-02-12 NOTE — Therapy (Signed)
[facility] [address] [place], [place], [address] Phone: [phone]   Fax:  [phone]  Physical Therapy Treatment  Patient Details  Name: Johnathan Ross MRN: [id number] Date of Birth: 20-Feb-1971 Referring Provider (PT): Felecia Shelling, Nanine Means, MD   Encounter Date: 02/12/2020   PT End of Session - 02/12/20 1007    Visit Number 65    Number of Visits 93    Date for PT Re-Evaluation 03/08/20    Authorization Type Medicare Part A and B; 10th visit PN    Progress Note Due on Visit 52    PT Start Time 0848    PT Stop Time 0940    PT Time Calculation (min) 52 min    Activity Tolerance Patient tolerated treatment well    Behavior During Therapy [facility] for tasks assessed/performed           Past Medical History:  Diagnosis Date  . Hyperlipidemia   . Hypertension   . Hypogonadism in male 11/01/2016  . Hypothyroidism   . Multiple sclerosis ([name])   . Proliferative diabetic retinopathy(362.02)   . Type 1 diabetes mellitus ([place]) dx'd 1994  . Vitamin D insufficiency 11/29/2016    Past Surgical History:  Procedure Laterality Date  . EYE SURGERY Bilateral    "laser for diabetic retinopathy"  . FRACTURE SURGERY    . OPEN REDUCTION INTERNAL FIXATION (ORIF) TIBIA/FIBULA FRACTURE Right 10/30/2016  . ORIF ANKLE FRACTURE Left 2015  . ORIF TIBIA FRACTURE Right 10/30/2016   Procedure: OPEN REDUCTION INTERNAL FIXATION (ORIF) TIBIA FIBULA FRACTURE;  Surgeon: Altamese Dumont, MD;  Location: [facility];  Service: Orthopedics;  Laterality: Right;  . RETINAL LASER PROCEDURE Bilateral    "for diabetic retinopathy"    There were no vitals filed for this visit.   Subjective Assessment - 02/12/20 0853    Subjective Exercises are going better.    Patient is accompained by: Family member   mom in car   Pertinent History multiple falls, vitamin D insufficiency, Type 1 IDDM, diabetic retinopathy, hypothyroidism, HTN, hyperlipidemia, R tibia  fracture, L ankle fracture with ORIF    Diagnostic tests No new lesions seen on MRI    Patient Stated Goals To improve walking    Currently in Pain? No/denies              [facility] PT Assessment - 02/12/20 0853      Assessment   Medical Diagnosis MS    Referring Provider (PT) Sater, Nanine Means, MD    Onset Date/Surgical Date 12/15/18    Hand Dominance Right      Strength   Overall Strength Deficits    Overall Strength Comments LLE: hip flexion 2/5, knee extension: 4/5, knee flexion 4/5, DF 3-/5.  RLE: hip flexion 3-/5, knee extension 4/5, knee flexion 4/5, ankle DF 1/5      Transfers   Sit to Stand 6: Modified independent (Device/Increase time)    Stand to Sit 6: Modified independent (Device/Increase time)    Stand Pivot Transfers 6: Modified independent (Device/Increase time)    Stand Pivot Transfer Details (indicate cue type and reason) with RW      Ambulation/Gait   Ambulation/Gait Yes    Ambulation/Gait Assistance 6: Modified independent (Device/Increase time)    Ambulation/Gait Assistance Details MOD I with RW, takes a few regular steps combined with intermittent lifting body with UE on RW and swinging LE forwards    Ambulation Distance (Feet) 115 Feet    Assistive device Rolling  walker    Gait Pattern Step-to pattern;Step-through pattern;Decreased stance time - right;Decreased stride length;Decreased hip/knee flexion - right;Decreased hip/knee flexion - left;Decreased dorsiflexion - right;Decreased dorsiflexion - left;Right genu recurvatum;Left genu recurvatum;Poor foot clearance - right    Ambulation Surface Level;Indoor      Berg Balance Test   Sit to Stand Able to stand without using hands and stabilize independently    Standing Unsupported Able to stand safely 2 minutes    Sitting with Back Unsupported but Feet Supported on Floor or Stool Able to sit safely and securely 2 minutes    Stand to Sit Controls descent by using hands    Transfers Able to transfer safely,  definite need of hands    Standing Unsupported with Eyes Closed Able to stand 10 seconds safely    Standing Unsupported with Feet Together Needs help to attain position but able to stand for 30 seconds with feet together    From Standing, Reach Forward with Outstretched Arm Can reach confidently >25 cm (10")    From Standing Position, Pick up Object from Floor Able to pick up shoe safely and easily    From Standing Position, Turn to Look Behind Over each Shoulder Needs assist to keep from losing balance and falling    Turn 360 Degrees Needs assistance while turning    Standing Unsupported, Alternately Place Feet on Step/Stool Needs assistance to keep from falling or unable to try    Standing Unsupported, One Foot in Front Able to take small step independently and hold 30 seconds    Standing on One Leg Unable to try or needs assist to prevent fall    Total Score 33    Berg comment: 33/56  Continued to experience knees "locking out".  After performing gastroc stretch, slightly decreased genu recurvatum noted in static stance.  Increased noted during dynamic movement                         [facility] Adult PT Treatment/Exercise - 02/12/20 0853      Therapeutic Activites    Therapeutic Activities Other Therapeutic Activities    Other Therapeutic Activities Discussed with pt if he has noticed any changes in his LE sensation.  Pt denies any changes.  Discussed sensory findings from most recent neurology visit with decreased reflexes in toes, decreased vibration sensation.  Pt states he has not had a nerve conduction study recently.  Discussed findings of decreased ankle and hip flexor strength as compared to one year ago.  Discussed how changes in sensation and strength from neuropathy may be affecting balance.  Will reassess strength next session before pt fatigues and then will discuss with neurologist if needed.      Exercises   Exercises Ankle      Ankle Exercises: Stretches    Gastroc Stretch 2 reps;30 seconds    Gastroc Stretch Limitations R and L foot on edge of step                  PT Education - 02/12/20 1006    Education Details see TA, results of balance and MMT    Person(s) Educated Patient    Methods Explanation    Comprehension Verbalized understanding            PT Short Term Goals - 02/12/20 1009      PT SHORT TERM GOAL #1   Title Patient will demonstrate independence with updated HEP    Time 4  Period Weeks    Status Achieved    Target Date 02/05/20      PT SHORT TERM GOAL #2   Title Pt will improve gait velocity with cane to >/= 0.5 ft/sec    Time 4    Period Weeks    Status Revised    Target Date 02/05/20      PT SHORT TERM GOAL #3   Title Pt will improve BERG to >/= 42/56    Baseline declined to 33/56    Time 4    Period Weeks    Status Not Met    Target Date 02/05/20      PT SHORT TERM GOAL #4   Title Pt will ambulate x 115' over flat, indoor surfaces with cane and with min A and intermittent standing rest breaks.    Time 4    Period Weeks    Status Revised    Target Date 02/05/20             PT Long Term Goals - 01/14/20 2116      PT LONG TERM GOAL #1   Title Pt will demonstrate independence with land HEP    Time 8    Period Weeks    Status Revised    Target Date 03/08/20      PT LONG TERM GOAL #2   Title Pt will improve gait velocity with cane to >/= 0.8 ft/sec    Baseline .34 ft/sec with cane    Time 8    Period Weeks    Status Revised    Target Date 03/08/20      PT LONG TERM GOAL #3   Title Pt will improve BERG to >/= 45/56 to decrease falls risk    Baseline 39/56 on 01/08/20    Time 8    Period Weeks    Status Revised    Target Date 03/08/20      PT LONG TERM GOAL #4   Title Pt will ambulate x 200' outside over pavement and grass with supervision and cane to improve safety ambulating to wood working shop at home    Time 8    Period Weeks    Status Revised    Target Date  03/08/20                 Plan - 02/12/20 1007    Clinical Impression Statement Continued assessment of STG with assessment of BERG balance test.  Required full session to assess BERG due to multiple LOB, increased time to perform each task and multiple rest breaks.  Pt's BERG score has declined, pt's hip flexor and ankle strength has declined since last year and pt continues to experience increased extensor tone and genu recurvatum despite going up on Baclofen dosage which continue to impact balance and safety with gait and patient progress.  Will continue to assess, address and discuss with neurologist.    Comorbidities multiple falls, vitamin D insufficiency, Type 1 IDDM, diabetic retinopathy, hypothyroidism, HTN, hyperlipidemia, R tibia fracture, L ankle fracture with ORIF    Rehab Potential Good    PT Frequency 2x / week    PT Duration 8 weeks    PT Treatment/Interventions ADLs/Self Care Home Management;Aquatic Therapy;Electrical Stimulation;DME Instruction;Gait training;Stair training;Functional mobility training;Therapeutic activities;Therapeutic exercise;Balance training;Neuromuscular re-education;Patient/family education;Orthotic Fit/Training;Passive range of motion;Energy conservation    PT Next Visit Plan CHECK MMT again before fatigued, finish STG - gait with cane.  Postural work.  Hip rotation strengthening and working on pivoting/turning.  Gait with crutches; cane when appropriate    PT Home Exercise Plan Access Code: [email or web address] (updated on 02/05/20)    Consulted and Agree with Plan of Care Patient           Patient will benefit from skilled therapeutic intervention in order to improve the following deficits and impairments:  Abnormal gait, Decreased balance, Decreased endurance, Decreased coordination, Decreased range of motion, Decreased strength, Difficulty walking, Impaired tone, Postural dysfunction, Decreased activity tolerance, Decreased mobility  Visit  Diagnosis: Unsteadiness on feet  Difficulty in walking, not elsewhere classified  Other abnormalities of gait and mobility  Muscle weakness (generalized)  Repeated falls  Foot drop, right     Problem List Patient Active Problem List   Diagnosis Date Noted  . Gait disturbance 08/19/2019  . Acquired diplegia ([place]) 08/19/2019  . Vitamin D insufficiency 11/29/2016  . Hypogonadism in male 11/01/2016  . 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[place])   . Hypothyroidism   . Hypertension   . Hyperlipidemia   . Closed fracture of right fibula and tibia 10/29/2016  . Closed tibia fracture 10/29/2016  . Closed fracture of right tibial plafond with fibula involvement 10/29/2016  . Abnormal liver function tests 03/06/2013  . RASH AND OTHER NONSPECIFIC SKIN ERUPTION 10/07/2009  . SHOULDER PAIN, LEFT 12/27/2008  . Type I (juvenile type) diabetes mellitus without mention of complication, uncontrolled 12/03/2007  . HYPOTHYROIDISM 11/12/2007  . HYPERCHOLESTEROLEMIA 11/12/2007  . MULTIPLE SCLEROSIS 08/29/2007  . PROLIFERATIVE DIABETIC RETINOPATHY 08/29/2007  . HYPERTENSION 12/25/2006    [name], PT, DPT 02/12/20    10:12 AM    [facility] [address] [place], [place], [address] Phone: [phone]   Fax:  [phone]  Name: BARAN KUHRT MRN: [id number] Date of Birth: 27-Oct-1970

## 2020-02-17 ENCOUNTER — Other Ambulatory Visit: Payer: Self-pay

## 2020-02-17 ENCOUNTER — Encounter: Payer: Self-pay | Admitting: Physical Therapy

## 2020-02-17 ENCOUNTER — Ambulatory Visit: Payer: Medicare Other | Admitting: Physical Therapy

## 2020-02-17 DIAGNOSIS — R2681 Unsteadiness on feet: Secondary | ICD-10-CM

## 2020-02-17 DIAGNOSIS — R2689 Other abnormalities of gait and mobility: Secondary | ICD-10-CM | POA: Diagnosis not present

## 2020-02-17 DIAGNOSIS — R296 Repeated falls: Secondary | ICD-10-CM | POA: Diagnosis not present

## 2020-02-17 DIAGNOSIS — M6281 Muscle weakness (generalized): Secondary | ICD-10-CM

## 2020-02-17 DIAGNOSIS — M21371 Foot drop, right foot: Secondary | ICD-10-CM

## 2020-02-17 DIAGNOSIS — R262 Difficulty in walking, not elsewhere classified: Secondary | ICD-10-CM | POA: Diagnosis not present

## 2020-02-17 DIAGNOSIS — M21372 Foot drop, left foot: Secondary | ICD-10-CM

## 2020-02-17 NOTE — Therapy (Signed)
[facility] [address] [place], [place], [address] Phone: [phone]   Fax:  [phone]  Physical Therapy Treatment  Patient Details  Name: Johnathan Ross MRN: [id number] Date of Birth: 1971-02-21 Referring Provider (PT): Felecia Shelling, Nanine Means, MD   Encounter Date: 02/17/2020   PT End of Session - 02/17/20 1015    Visit Number 92    Number of Visits 93    Date for PT Re-Evaluation 03/08/20    Authorization Type Medicare Part A and B; 10th visit PN    Progress Note Due on Visit 36    PT Start Time 0845    PT Stop Time 0930    PT Time Calculation (min) 45 min    Activity Tolerance Patient tolerated treatment well    Behavior During Therapy [facility] for tasks assessed/performed           Past Medical History:  Diagnosis Date  . Hyperlipidemia   . Hypertension   . Hypogonadism in male 11/01/2016  . Hypothyroidism   . Multiple sclerosis ([place])   . Proliferative diabetic retinopathy(362.02)   . Type 1 diabetes mellitus ([place]) dx'd 1994  . Vitamin D insufficiency 11/29/2016    Past Surgical History:  Procedure Laterality Date  . EYE SURGERY Bilateral    "laser for diabetic retinopathy"  . FRACTURE SURGERY    . OPEN REDUCTION INTERNAL FIXATION (ORIF) TIBIA/FIBULA FRACTURE Right 10/30/2016  . ORIF ANKLE FRACTURE Left 2015  . ORIF TIBIA FRACTURE Right 10/30/2016   Procedure: OPEN REDUCTION INTERNAL FIXATION (ORIF) TIBIA FIBULA FRACTURE;  Surgeon: Altamese Grafton, MD;  Location: [facility];  Service: Orthopedics;  Laterality: Right;  . RETINAL LASER PROCEDURE Bilateral    "for diabetic retinopathy"    There were no vitals filed for this visit.   Subjective Assessment - 02/17/20 0854    Subjective Has been feeling really weak the past few days but has been on 3 baclofen, 3x/day.  R eye is very irritated today, vision is a little blurry.  Having a hard time advancing and clearing L foot.  Has been more fatigued.  When more  fatigued, LE locking out more.    Patient is accompained by: Family member   mom in car   Pertinent History multiple falls, vitamin D insufficiency, Type 1 IDDM, diabetic retinopathy, hypothyroidism, HTN, hyperlipidemia, R tibia fracture, L ankle fracture with ORIF    Diagnostic tests No new lesions seen on MRI    Patient Stated Goals To improve walking    Currently in Pain? No/denies              [facility] PT Assessment - 02/17/20 0859      Assessment   Medical Diagnosis MS    Referring Provider (PT) Sater, Nanine Means, MD    Onset Date/Surgical Date 12/15/18      ROM / Strength   AROM / PROM / Strength Strength      Strength   Overall Strength Deficits    Overall Strength Comments LLE: hip flexion 2/5, knee extension: 4-/5, knee flexion 4-/5, DF 3-/5.  RLE: hip flexion 3-/5, knee extension 4/5, knee flexion 4-/5, ankle DF 1/5      Standardized Balance Assessment   Standardized Balance Assessment 10 meter walk test    10 Meter Walk 2:03 with cane or .26 ft/sec, decreased from initial assessment with cane  [facility] Adult PT Treatment/Exercise - 02/17/20 0950      Ambulation/Gait   Ambulation/Gait Yes    Ambulation/Gait Assistance 6: Modified independent (Device/Increase time);4: Min assist    Ambulation/Gait Assistance Details MOD I with RW but with increased difficulty clearing LLE today; performed gait velocity assessment with Hurrycane with min A for balance to prevent intermittent LOB posterior or laterally.  Intermittent standing rest breaks with cane due to genu recurvatum or inability to advance R or L foot    Ambulation Distance (Feet) 60 Feet    Assistive device Rolling walker;Straight cane    Gait Pattern Step-to pattern;Step-through pattern;Decreased stride length;Decreased hip/knee flexion - right;Decreased hip/knee flexion - left;Decreased dorsiflexion - right;Decreased dorsiflexion - left;Right genu recurvatum;Left genu recurvatum;Poor  foot clearance - right;Decreased step length - right;Decreased step length - left;Trunk flexed;Narrow base of support;Poor foot clearance - left    Ambulation Surface Level;Indoor      Therapeutic Activites    Therapeutic Activities Other Therapeutic Activities    Other Therapeutic Activities Added visits through end of cert period but discussed plan for possible break from therapy in October but will await to hear back from neurologist about possible cause of progressive weakness and imbalance.  Pt verbalized agreement.      Knee/Hip Exercises: Seated   Other Seated Knee/Hip Exercises Performed perched sitting on bolster on mat to increase activation of ankles and knees to maintain position on bolster x 10 seconds x 5 reps.  Performed sit  > stand from bolster with therapist assisting with anterior translation of femur and tibia over BOS; 1-2 episodes of sliding down bolster or LOB posteriorly during sit > stand.  Cued pt to perform stand > sit very slowly for increased eccentric control    Sit to Sand 1 set;5 reps;without UE support;Other (comment)   from bolster     Ankle Exercises: Seated   Toe Raise 10 reps;Limitations    Toe Raise Limitations started with green theraband resisted but unable to perform with resistance; utilized gravity for resistance.  2 sets                    PT Short Term Goals - 02/17/20 1014      PT SHORT TERM GOAL #1   Title Patient will demonstrate independence with updated HEP    Time 4    Period Weeks    Status Achieved    Target Date 02/05/20      PT SHORT TERM GOAL #2   Title Pt will improve gait velocity with cane to >/= 0.5 ft/sec    Baseline decreased since initial assessment    Time 4    Period Weeks    Status Not Met    Target Date 02/05/20      PT SHORT TERM GOAL #3   Title Pt will improve BERG to >/= 42/56    Baseline declined to 33/56    Time 4    Period Weeks    Status Not Met    Target Date 02/05/20      PT SHORT TERM GOAL  #4   Title Pt will ambulate x 115' over flat, indoor surfaces with cane and with min A and intermittent standing rest breaks.    Time 4    Period Weeks    Status Not Met    Target Date 02/05/20             PT Long Term Goals - 02/17/20 1205  PT LONG TERM GOAL #1   Title Pt will demonstrate independence with land HEP  (LTG due by 03/08/20)    Time 8    Period Weeks    Status Revised      PT LONG TERM GOAL #2   Title Pt will improve gait velocity with cane to >/= 0.8 ft/sec    Baseline decreased to .26 ft/sec with cane    Time 8    Period Weeks    Status Revised      PT LONG TERM GOAL #3   Title Pt will improve BERG to >/= 45/56 to decrease falls risk    Baseline Decreased to 33/56    Time 8    Period Weeks    Status Revised      PT LONG TERM GOAL #4   Title Pt will ambulate x 200' outside over pavement and grass with supervision and cane to improve safety ambulating to wood working shop at home    Time 8    Period Weeks    Status Revised                 Plan - 02/17/20 1015    Clinical Impression Statement Completed assessment of STG.  Pt only met HEP goal but did not meet balance or gait velocity goal; pt's gait velocity, balance and LE strength have declined despite consistent attendance in therapy and performance of HEP.  Will contact physician to see if there is a medical cause.  Will continue to address impairments and progress as able towards LTG to minimize risk for falls.    Comorbidities multiple falls, vitamin D insufficiency, Type 1 IDDM, diabetic retinopathy, hypothyroidism, HTN, hyperlipidemia, R tibia fracture, L ankle fracture with ORIF    Rehab Potential Good    PT Frequency 2x / week    PT Duration 8 weeks    PT Treatment/Interventions ADLs/Self Care Home Management;Aquatic Therapy;Electrical Stimulation;DME Instruction;Gait training;Stair training;Functional mobility training;Therapeutic activities;Therapeutic exercise;Balance  training;Neuromuscular re-education;Patient/family education;Orthotic Fit/Training;Passive range of motion;Energy conservation    PT Next Visit Plan Message Sr. Sater.  Ankle strength.  Postural work.  Hip rotation strengthening and working on pivoting/turning.  Gait with crutches; cane when appropriate    PT Home Exercise Plan Access Code: [email or web address] (updated on 02/05/20)    Consulted and Agree with Plan of Care Patient           Patient will benefit from skilled therapeutic intervention in order to improve the following deficits and impairments:  Abnormal gait, Decreased balance, Decreased endurance, Decreased coordination, Decreased range of motion, Decreased strength, Difficulty walking, Impaired tone, Postural dysfunction, Decreased activity tolerance, Decreased mobility  Visit Diagnosis: Unsteadiness on feet  Difficulty in walking, not elsewhere classified  Other abnormalities of gait and mobility  Muscle weakness (generalized)  Repeated falls  Foot drop, right  Foot drop, left     Problem List Patient Active Problem List   Diagnosis Date Noted  . Gait disturbance 08/19/2019  . Acquired diplegia ([place]) 08/19/2019  . Vitamin D insufficiency 11/29/2016  . Hypogonadism in male 11/01/2016  . 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 Hypothyroidism   . Hypertension   . Hyperlipidemia   . Closed fracture of right fibula and tibia 10/29/2016  . Closed tibia fracture 10/29/2016  . Closed fracture of right tibial plafond with fibula involvement 10/29/2016  . Abnormal liver function tests 03/06/2013  . RASH AND OTHER NONSPECIFIC SKIN ERUPTION 10/07/2009  . SHOULDER PAIN, LEFT 12/27/2008  .  Type I (juvenile type) diabetes mellitus without mention of complication, uncontrolled 12/03/2007  . HYPOTHYROIDISM 11/12/2007  . HYPERCHOLESTEROLEMIA 11/12/2007  . MULTIPLE SCLEROSIS 08/29/2007  . PROLIFERATIVE DIABETIC RETINOPATHY 08/29/2007  . HYPERTENSION  12/25/2006    [name], PT, DPT 02/17/20    12:07 PM    [facility] [address] [place], [place], [address] Phone: [phone]   Fax:  [phone]  Name: Johnathan Ross MRN: [id number] Date of Birth: [birth year]

## 2020-02-18 ENCOUNTER — Telehealth: Payer: Self-pay | Admitting: Hematology and Oncology

## 2020-02-18 NOTE — Telephone Encounter (Signed)
Received a new hem referral from Jodi Mourning, NP for dvt. Mr. Innocent returned my call and has been scheduled to see Dr. Lorenso Courier on 10/18 at [facility]. Pt aware to arrive 20 minutes early.

## 2020-02-19 ENCOUNTER — Encounter: Payer: Self-pay | Admitting: Physical Therapy

## 2020-02-19 ENCOUNTER — Other Ambulatory Visit: Payer: Self-pay

## 2020-02-19 ENCOUNTER — Ambulatory Visit: Payer: Medicare Other | Admitting: Physical Therapy

## 2020-02-19 DIAGNOSIS — R2689 Other abnormalities of gait and mobility: Secondary | ICD-10-CM

## 2020-02-19 DIAGNOSIS — R262 Difficulty in walking, not elsewhere classified: Secondary | ICD-10-CM | POA: Diagnosis not present

## 2020-02-19 DIAGNOSIS — M21371 Foot drop, right foot: Secondary | ICD-10-CM | POA: Diagnosis not present

## 2020-02-19 DIAGNOSIS — R296 Repeated falls: Secondary | ICD-10-CM

## 2020-02-19 DIAGNOSIS — R2681 Unsteadiness on feet: Secondary | ICD-10-CM

## 2020-02-19 DIAGNOSIS — M6281 Muscle weakness (generalized): Secondary | ICD-10-CM | POA: Diagnosis not present

## 2020-02-19 NOTE — Therapy (Signed)
[facility] [address] [place], [place], [address] Phone: [phone]   Fax:  [phone]  Physical Therapy Treatment  Patient Details  Name: Johnathan Ross MRN: [id number] Date of Birth: 1970/12/21 Referring Provider (PT): Britt Bottom, MD   Encounter Date: 02/19/2020   PT End of Session - 02/19/20 0954    Visit Number 51    Number of Visits 93    Date for PT Re-Evaluation 03/08/20    Authorization Type Medicare Part A and B; 10th visit PN    Progress Note Due on Visit 9    PT Start Time 0850    PT Stop Time 0935    PT Time Calculation (min) 45 min    Activity Tolerance Patient tolerated treatment well    Behavior During Therapy [facility] for tasks assessed/performed           Past Medical History:  Diagnosis Date  . Hyperlipidemia   . Hypertension   . Hypogonadism in male 11/01/2016  . Hypothyroidism   . Multiple sclerosis ([place])   . Proliferative diabetic retinopathy(362.02)   . Type 1 diabetes mellitus ([place]) dx'd 1994  . Vitamin D insufficiency 11/29/2016    Past Surgical History:  Procedure Laterality Date  . EYE SURGERY Bilateral    "laser for diabetic retinopathy"  . FRACTURE SURGERY    . OPEN REDUCTION INTERNAL FIXATION (ORIF) TIBIA/FIBULA FRACTURE Right 10/30/2016  . ORIF ANKLE FRACTURE Left 2015  . ORIF TIBIA FRACTURE Right 10/30/2016   Procedure: OPEN REDUCTION INTERNAL FIXATION (ORIF) TIBIA FIBULA FRACTURE;  Surgeon: Altamese [place], MD;  Location: [facility];  Service: Orthopedics;  Laterality: Right;  . RETINAL LASER PROCEDURE Bilateral    "for diabetic retinopathy"    There were no vitals filed for this visit.   Subjective Assessment - 02/19/20 0853    Subjective R eye is still blurry, less redness today.  Less weakness today and improved LLE clearance today.  PT contacted neurologist about weakness - will contact pt to set up f/u appointment.    Patient is accompained by: Family member   mom  in car   Pertinent History multiple falls, vitamin D insufficiency, Type 1 IDDM, diabetic retinopathy, hypothyroidism, HTN, hyperlipidemia, R tibia fracture, L ankle fracture with ORIF    Diagnostic tests No new lesions seen on MRI    Patient Stated Goals To improve walking    Currently in Pain? No/denies                             [facility] Adult PT Treatment/Exercise - 02/19/20 0944      Ambulation/Gait   Ambulation/Gait Yes    Ambulation/Gait Assistance 5: Supervision    Ambulation/Gait Assistance Details gait training with RW with focus on carryover of anterior pelvic rotation on R and push off through gastroc to initiate RLE swing phase, slightly shortened step length RLE to prevent slinging RLE forwards into ER and genu recurvatum; also shortened R step length to allow increased weight shift over RLE during stance phase.  Therapist provided intermittent facilitation at pelvis.  Decreased genu recurvatum noted during gait    Ambulation Distance (Feet) 100 Feet    Assistive device Rolling walker    Gait Pattern Step-through pattern;Decreased hip/knee flexion - right;Decreased dorsiflexion - right;Poor foot clearance - right;Decreased step length - right;Narrow base of support;Decreased weight shift to right    Ambulation Surface Level;Indoor      Balance  Balance Assessed Yes      Static Standing Balance   Static Standing - Balance Support No upper extremity supported    Static Standing - Level of Assistance 4: Min assist;3: Mod assist    Static Standing - Comment/# of Minutes Performed static standing with feet on wedge to facilitate increased ankle DF during stance and cues to rotate R hip forwards and bring trunk more upright in standing to prevent posterior LOB.  During static standing with EO performed head turns/head nods x 10 reps each; 2 sets x 10 reps alternating UE raises and alternating reaching across midline with slight trunk rotation; also performed 3 reps  EC.  Intermittent verbal and tactile cues to rotate R pelvis forwards and for more upright trunk.        Exercises   Exercises Other Exercises    Other Exercises  Pt continues to have questions about how to perform hip flexion exercise on HEP.  Continued to review supine isometric hip flexion x 5 reps each side and then added back in bridge with marching, alternating LE x 5 reps each.  Cues to lift hips and stabilize first before lifting LE.  Pt demonstrated and verbalized understanding of both exercises               Balance Exercises - 02/19/20 0950      Balance Exercises: Standing   Standing Eyes Opened Wide (BOA);Head turns;Other reps (comment);Limitations    Standing Eyes Opened Limitations feet on wedge, head turns and head nods x 10, alternating UE raises or reaching across midline    Standing Eyes Closed Wide (BOA);3 reps;10 secs;Limitations    Standing Eyes Closed Limitations standing with feet on wedge in DF           Access Code: [email or web address] URL: [email or web address] Date: 02/19/2020 Prepared by: Misty Stanley  Exercises Child's Pose Stretch - 1 x daily - 7 x weekly - 3 sets - 20 second hold Quadruped Hip Extension Kicks - 1 x daily - 5 x weekly - 5 reps - 4 sets Heel Sits - 1 x daily - 5 x weekly - 12 reps - 1 sets Tall Kneeling Diagonal Lift with Medicine Ball - 1 x daily - 5 x weekly - 5-10 reps - 1 sets Hooklying Isometric Hip Flexion - 1 x daily - 7 x weekly - 10 reps - 1 sets Sidelying Bent Knee Lift at 45 Degrees - 1 x daily - 7 x weekly - 3 sets - 5 reps Standing with Back Flat Against Wall - 1 x daily - 5 x weekly - 1 sets - 10 reps - 10 hold Supine Thoracic Mobilization Towel Roll Vertical with Arm Stretch - 1 x daily - 5 x weekly - 1 sets - 2 minutes hold Supine Shoulder Horizontal Abduction with Resistance - 1 x daily - 7 x weekly - 1 sets - 10 reps Supine Shoulder External Rotation with Dumbbell - 1 x daily - 7 x weekly - 2 sets - 10  reps Marching Bridge - 1 x daily - 7 x weekly - 1 sets - 5 reps         PT Short Term Goals - 02/17/20 1014      PT SHORT TERM GOAL #1   Title Patient will demonstrate independence with updated HEP    Time 4    Period Weeks    Status Achieved    Target Date 02/05/20      PT SHORT TERM GOAL #2  Title Pt will improve gait velocity with cane to >/= 0.5 ft/sec    Baseline decreased since initial assessment    Time 4    Period Weeks    Status Not Met    Target Date 02/05/20      PT SHORT TERM GOAL #3   Title Pt will improve BERG to >/= 42/56    Baseline declined to 33/56    Time 4    Period Weeks    Status Not Met    Target Date 02/05/20      PT SHORT TERM GOAL #4   Title Pt will ambulate x 115' over flat, indoor surfaces with cane and with min A and intermittent standing rest breaks.    Time 4    Period Weeks    Status Not Met    Target Date 02/05/20             PT Long Term Goals - 02/17/20 1205      PT LONG TERM GOAL #1   Title Pt will demonstrate independence with land HEP  (LTG due by 03/08/20)    Time 8    Period Weeks    Status Revised      PT LONG TERM GOAL #2   Title Pt will improve gait velocity with cane to >/= 0.8 ft/sec    Baseline decreased to .26 ft/sec with cane    Time 8    Period Weeks    Status Revised      PT LONG TERM GOAL #3   Title Pt will improve BERG to >/= 45/56 to decrease falls risk    Baseline Decreased to 33/56    Time 8    Period Weeks    Status Revised      PT LONG TERM GOAL #4   Title Pt will ambulate x 200' outside over pavement and grass with supervision and cane to improve safety ambulating to wood working shop at home    Time 8    Period Weeks    Status Revised                 Plan - 02/19/20 0954    Clinical Impression Statement Continued to review HEP recommendations for hip flexor strengthening.  Addressed and focused on static standing balance by utilizing wedge to place ankles in greater DF to  improve knee, hip, trunk alignment.  Pt required cues to keep R pelvis rotated anteriorly; pt reported feeling like he was rotated to L side.  Pt demonstrated improved gait technique at end of session following standing balance, ankle DF and pelvic rotation training.  Will continue to address and progress towards LTG.    Comorbidities multiple falls, vitamin D insufficiency, Type 1 IDDM, diabetic retinopathy, hypothyroidism, HTN, hyperlipidemia, R tibia fracture, L ankle fracture with ORIF    Rehab Potential Good    PT Frequency 2x / week    PT Duration 8 weeks    PT Treatment/Interventions ADLs/Self Care Home Management;Aquatic Therapy;Electrical Stimulation;DME Instruction;Gait training;Stair training;Functional mobility training;Therapeutic activities;Therapeutic exercise;Balance training;Neuromuscular re-education;Patient/family education;Orthotic Fit/Training;Passive range of motion;Energy conservation    PT Next Visit Plan 10th visit PN.  Ankle strength and standing balance with feet in DF (wedge/ramp).  Postural work.  Hip rotation strengthening and working on pivoting/turning.  Gait with crutches; cane when appropriate    PT Home Exercise Plan Access Code: [email or web address] (updated on 02/05/20)    Consulted and Agree with Plan of Care Patient  Patient will benefit from skilled therapeutic intervention in order to improve the following deficits and impairments:  Abnormal gait, Decreased balance, Decreased endurance, Decreased coordination, Decreased range of motion, Decreased strength, Difficulty walking, Impaired tone, Postural dysfunction, Decreased activity tolerance, Decreased mobility  Visit Diagnosis: Unsteadiness on feet  Difficulty in walking, not elsewhere classified  Other abnormalities of gait and mobility  Muscle weakness (generalized)  Repeated falls  Foot drop, right     Problem List Patient Active Problem List   Diagnosis Date Noted  . Gait disturbance  08/19/2019  . Acquired diplegia ([place]) 08/19/2019  . Vitamin D insufficiency 11/29/2016  . Hypogonadism in male 11/01/2016  . 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[place])   . Hypothyroidism   . Hypertension   . Hyperlipidemia   . Closed fracture of right fibula and tibia 10/29/2016  . Closed tibia fracture 10/29/2016  . Closed fracture of right tibial plafond with fibula involvement 10/29/2016  . Abnormal liver function tests 03/06/2013  . RASH AND OTHER NONSPECIFIC SKIN ERUPTION 10/07/2009  . SHOULDER PAIN, LEFT 12/27/2008  . Type I (juvenile type) diabetes mellitus without mention of complication, uncontrolled 12/03/2007  . HYPOTHYROIDISM 11/12/2007  . HYPERCHOLESTEROLEMIA 11/12/2007  . MULTIPLE SCLEROSIS 08/29/2007  . PROLIFERATIVE DIABETIC RETINOPATHY 08/29/2007  . HYPERTENSION 12/25/2006    [name], PT, DPT 02/19/20    10:06 AM    [facility] [address] [place], [place], [address] Phone: [phone]   Fax:  [phone]  Name: Johnathan Ross MRN: [id number] Date of Birth: [birth year]

## 2020-02-19 NOTE — Patient Instructions (Addendum)
Access Code: [email or web address] URL: [email or web address] Date: 02/19/2020 Prepared by: Misty Stanley  Exercises Child's Pose Stretch - 1 x daily - 7 x weekly - 3 sets - 20 second hold Quadruped Hip Extension Kicks - 1 x daily - 5 x weekly - 5 reps - 4 sets Heel Sits - 1 x daily - 5 x weekly - 12 reps - 1 sets Tall Kneeling Diagonal Lift with Medicine Ball - 1 x daily - 5 x weekly - 5-10 reps - 1 sets Hooklying Isometric Hip Flexion - 1 x daily - 7 x weekly - 10 reps - 1 sets Sidelying Bent Knee Lift at 45 Degrees - 1 x daily - 7 x weekly - 3 sets - 5 reps Standing with Back Flat Against Wall - 1 x daily - 5 x weekly - 1 sets - 10 reps - 10 hold Supine Thoracic Mobilization Towel Roll Vertical with Arm Stretch - 1 x daily - 5 x weekly - 1 sets - 2 minutes hold Supine Shoulder Horizontal Abduction with Resistance - 1 x daily - 7 x weekly - 1 sets - 10 reps Supine Shoulder External Rotation with Dumbbell - 1 x daily - 7 x weekly - 2 sets - 10 reps Marching Bridge - 1 x daily - 7 x weekly - 1 sets - 5 reps

## 2020-02-22 ENCOUNTER — Telehealth: Payer: Self-pay | Admitting: Neurology

## 2020-02-22 NOTE — Telephone Encounter (Signed)
Called back and spoke with Fraser Din. She transferred me to pharmacist to clarify order for dalfampridine 10mg . Spoke with [facility].  Advised directions should be: Take 1 tablet (10 mg total) by mouth 2 (two) times daily. Rx e-scribed to them on 12/23/19 #180, 3 refills. They advised pt states MD told him to take medication TID. Advised I will follow up with him to let him know it should only be taken twice daily.

## 2020-02-22 NOTE — Telephone Encounter (Signed)
[facility]) Pt refilling early need to confirm dalfampridine (AMPYRA) 10 MG TB12 is  3 times a day.

## 2020-02-24 ENCOUNTER — Encounter: Payer: Self-pay | Admitting: Physical Therapy

## 2020-02-24 ENCOUNTER — Other Ambulatory Visit: Payer: Self-pay

## 2020-02-24 ENCOUNTER — Ambulatory Visit: Payer: Medicare Other | Admitting: Physical Therapy

## 2020-02-24 DIAGNOSIS — R262 Difficulty in walking, not elsewhere classified: Secondary | ICD-10-CM | POA: Diagnosis not present

## 2020-02-24 DIAGNOSIS — R296 Repeated falls: Secondary | ICD-10-CM | POA: Diagnosis not present

## 2020-02-24 DIAGNOSIS — M21371 Foot drop, right foot: Secondary | ICD-10-CM | POA: Diagnosis not present

## 2020-02-24 DIAGNOSIS — R2681 Unsteadiness on feet: Secondary | ICD-10-CM | POA: Diagnosis not present

## 2020-02-24 DIAGNOSIS — M6281 Muscle weakness (generalized): Secondary | ICD-10-CM

## 2020-02-24 DIAGNOSIS — R2689 Other abnormalities of gait and mobility: Secondary | ICD-10-CM | POA: Diagnosis not present

## 2020-02-24 DIAGNOSIS — M21372 Foot drop, left foot: Secondary | ICD-10-CM

## 2020-02-24 NOTE — Telephone Encounter (Signed)
LVM for pt to call office

## 2020-02-24 NOTE — Telephone Encounter (Signed)
The FDA approved dose is twice daily .  The risk of seizure is much higher at three pills a day.  --- if insurance will not cover, he could go down to two pills.

## 2020-02-24 NOTE — Therapy (Addendum)
[facility] [address] [place], [place], [address] Phone: [phone]   Fax:  [phone]  Physical Therapy Treatment and 10th Visit Progress Note  Patient Details  Name: Johnathan Ross MRN: [id number] Date of Birth: May 15, 1971 Referring Provider (PT): Felecia Shelling, Nanine Means, MD   Encounter Date: 02/24/2020   Progress Note Reporting Period 01/20/2020 to 02/24/2020  See note below for Objective Data and Assessment of Progress/Goals.   [name], PT, DPT 02/26/20    8:29 AM    PT End of Session - 02/24/20 0852    Visit Number 90    Number of Visits 93    Date for PT Re-Evaluation 03/08/20    Authorization Type Medicare Part A and B; 10th visit PN    Progress Note Due on Visit 100    PT Start Time 0849    PT Stop Time 0930    PT Time Calculation (min) 41 min    Equipment Utilized During Treatment Gait belt    Activity Tolerance Patient tolerated treatment well    Behavior During Therapy WFL for tasks assessed/performed           Past Medical History:  Diagnosis Date  . Hyperlipidemia   . Hypertension   . Hypogonadism in male 11/01/2016  . Hypothyroidism   . Multiple sclerosis ([place])   . Proliferative diabetic retinopathy(362.02)   . Type 1 diabetes mellitus ([place]) dx'd 1994  . Vitamin D insufficiency 11/29/2016    Past Surgical History:  Procedure Laterality Date  . EYE SURGERY Bilateral    "laser for diabetic retinopathy"  . FRACTURE SURGERY    . OPEN REDUCTION INTERNAL FIXATION (ORIF) TIBIA/FIBULA FRACTURE Right 10/30/2016  . ORIF ANKLE FRACTURE Left 2015  . ORIF TIBIA FRACTURE Right 10/30/2016   Procedure: OPEN REDUCTION INTERNAL FIXATION (ORIF) TIBIA FIBULA FRACTURE;  Surgeon: Altamese Morton, MD;  Location: [facility];  Service: Orthopedics;  Laterality: Right;  . RETINAL LASER PROCEDURE Bilateral    "for diabetic retinopathy"    There were no vitals filed for this visit.   Subjective Assessment -  02/24/20 0851    Subjective Right eye has cleared up. No falls or pain. See's Dr. Felecia Shelling next Tuesday. New ex's are going well.    Patient is accompained by: Family member   mom in car   Pertinent History multiple falls, vitamin D insufficiency, Type 1 IDDM, diabetic retinopathy, hypothyroidism, HTN, hyperlipidemia, R tibia fracture, L ankle fracture with ORIF    Limitations Walking    Diagnostic tests No new lesions seen on MRI    Patient Stated Goals To improve walking    Currently in Pain? No/denies    Pain Score 0-No pain            [facility] Adult PT Treatment/Exercise - 02/24/20 0856      Transfers   Transfers Sit to Stand;Stand to Sit    Sit to Stand 6: Modified independent (Device/Increase time)    Stand to Sit 6: Modified independent (Device/Increase time)      Ambulation/Gait   Ambulation/Gait Yes    Ambulation/Gait Assistance 5: Supervision;4: Min guard    Ambulation/Gait Assistance Details several stops needed with gait with crutches due to left extensor tone to allow pt to obtain soft knees for gait. no significant balance loss noted.     Ambulation Distance (Feet) 115 Feet   x1 with crutches, plus into/out of gym with session   Assistive device Rolling walker;Lofstrands    Gait  Pattern Step-through pattern;Decreased hip/knee flexion - right;Decreased dorsiflexion - right;Poor foot clearance - right;Decreased step length - right;Narrow base of support;Decreased weight shift to right    Ambulation Surface Level;Indoor      Neuro Re-ed    Neuro Re-ed Details  for strengthening/muscle re-ed: standing with forefoot on blue wedge to promote increased DF with bil soft knees, intermittent touch to sturdy surface, mirror in front for visual feedback with cues/faciliation needed to promote anterior right pelvic position and left shoulder retraction to improve body alignment, also worked on lateral trunk stability with one hand facilitating left side inward, and other side (right) up due  to left lateral lean  in standing with following activities- alternating UE raises, bil UE raises for 10 reps each, then with arms at his sides head movements left<>right, up<>down for 10 reps. Performed each of these for 2 sets with a seated rest break betwee; standing between 2 chair next to mat table- working on 360* turns with light bil UE support on chair backs with min guard assist only. emphasis on stepping with foot clearance, weight shifting, tall posture and keeping soft knees.                   PT Short Term Goals - 02/17/20 1014      PT SHORT TERM GOAL #1   Title Patient will demonstrate independence with updated HEP    Time 4    Period Weeks    Status Achieved    Target Date 02/05/20      PT SHORT TERM GOAL #2   Title Pt will improve gait velocity with cane to >/= 0.5 ft/sec    Baseline decreased since initial assessment    Time 4    Period Weeks    Status Not Met    Target Date 02/05/20      PT SHORT TERM GOAL #3   Title Pt will improve BERG to >/= 42/56    Baseline declined to 33/56    Time 4    Period Weeks    Status Not Met    Target Date 02/05/20      PT SHORT TERM GOAL #4   Title Pt will ambulate x 115' over flat, indoor surfaces with cane and with min A and intermittent standing rest breaks.    Time 4    Period Weeks    Status Not Met    Target Date 02/05/20             PT Long Term Goals - 02/17/20 1205      PT LONG TERM GOAL #1   Title Pt will demonstrate independence with land HEP  (LTG due by 03/08/20)    Time 8    Period Weeks    Status Revised      PT LONG TERM GOAL #2   Title Pt will improve gait velocity with cane to >/= 0.8 ft/sec    Baseline decreased to .26 ft/sec with cane    Time 8    Period Weeks    Status Revised      PT LONG TERM GOAL #3   Title Pt will improve BERG to >/= 45/56 to decrease falls risk    Baseline Decreased to 33/56    Time 8    Period Weeks    Status Revised      PT LONG TERM GOAL #4   Title  Pt will ambulate x 200' outside over pavement and grass with supervision and  cane to improve safety ambulating to wood working shop at home    Time 8    Period Weeks    Status Revised                 Plan - 02/24/20 0854    Clinical Impression Statement Today's skilled session continued to focus on standing balanace and gait with forearm crutches. The pt was able to progress to just min guard assist with standing ex's this session. The pt should benefit from continued PT to progress toward unmet goals.    Comorbidities multiple falls, vitamin D insufficiency, Type 1 IDDM, diabetic retinopathy, hypothyroidism, HTN, hyperlipidemia, R tibia fracture, L ankle fracture with ORIF    Rehab Potential Good    PT Frequency 2x / week    PT Duration 8 weeks    PT Treatment/Interventions ADLs/Self Care Home Management;Aquatic Therapy;Electrical Stimulation;DME Instruction;Gait training;Stair training;Functional mobility training;Therapeutic activities;Therapeutic exercise;Balance training;Neuromuscular re-education;Patient/family education;Orthotic Fit/Training;Passive range of motion;Energy conservation    PT Next Visit Plan Ankle strength and standing balance with feet in DF (wedge/ramp).  Postural work.  Hip rotation strengthening and working on pivoting/turning.  Gait with crutches; cane when appropriate    PT Home Exercise Plan Access Code: [email or web address] (updated on 02/05/20)    Consulted and Agree with Plan of Care Patient           Patient will benefit from skilled therapeutic intervention in order to improve the following deficits and impairments:  Abnormal gait, Decreased balance, Decreased endurance, Decreased coordination, Decreased range of motion, Decreased strength, Difficulty walking, Impaired tone, Postural dysfunction, Decreased activity tolerance, Decreased mobility  Visit Diagnosis: Unsteadiness on feet  Difficulty in walking, not elsewhere classified  Other abnormalities of gait  and mobility  Muscle weakness (generalized)  Repeated falls  Foot drop, right  Foot drop, left     Problem List Patient Active Problem List   Diagnosis Date Noted  . Gait disturbance 08/19/2019  . Acquired diplegia ([place]) 08/19/2019  . Vitamin D insufficiency 11/29/2016  . Hypogonadism in male 11/01/2016  . 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[place])   . Hypothyroidism   . Hypertension   . Hyperlipidemia   . Closed fracture of right fibula and tibia 10/29/2016  . Closed tibia fracture 10/29/2016  . Closed fracture of right tibial plafond with fibula involvement 10/29/2016  . Abnormal liver function tests 03/06/2013  . RASH AND OTHER NONSPECIFIC SKIN ERUPTION 10/07/2009  . SHOULDER PAIN, LEFT 12/27/2008  . Type I (juvenile type) diabetes mellitus without mention of complication, uncontrolled 12/03/2007  . HYPOTHYROIDISM 11/12/2007  . HYPERCHOLESTEROLEMIA 11/12/2007  . MULTIPLE SCLEROSIS 08/29/2007  . PROLIFERATIVE DIABETIC RETINOPATHY 08/29/2007  . HYPERTENSION 12/25/2006   Willow Ora, PTA, [facility] [address] [place], [place] [address] [phone] 02/24/20, 11:02 PM   Name: RONDA KAZMI MRN: [id number] Date of Birth: [birth year]

## 2020-02-24 NOTE — Telephone Encounter (Signed)
Pt returned phone call.  

## 2020-02-24 NOTE — Telephone Encounter (Signed)
Pt has called to report that he is down to 5 of his dalfampridine.  The my chart message from [place], [place] was read to pt.  Pt states it was Dr Doyle Askew that advised him to try taking the medication more than twice a day to see if that would help, pt states it was even suggested that he takes a week off.  Pt states this is why he is down to 5 of his pills.  Please call once Dr Felecia Shelling has been spoken to about this request for a refill, pt is asking to be called on his home#

## 2020-02-24 NOTE — Telephone Encounter (Signed)
Called pt. Relayed Dr. Garth Bigness message. Pt will stay at twice daily dosing. He will call [facility] back to try and process refill. He will call back if he runs into any further issues.

## 2020-02-24 NOTE — Telephone Encounter (Signed)
Dr. Felecia Shelling- please advise. Not sure how you want to proceed with prescribing this? Insurance will not cover more than 2 per day

## 2020-02-26 ENCOUNTER — Encounter: Payer: Self-pay | Admitting: Physical Therapy

## 2020-02-26 ENCOUNTER — Ambulatory Visit: Payer: Medicare Other | Attending: Physician Assistant | Admitting: Physical Therapy

## 2020-02-26 ENCOUNTER — Other Ambulatory Visit: Payer: Self-pay

## 2020-02-26 DIAGNOSIS — R262 Difficulty in walking, not elsewhere classified: Secondary | ICD-10-CM

## 2020-02-26 DIAGNOSIS — R296 Repeated falls: Secondary | ICD-10-CM | POA: Diagnosis not present

## 2020-02-26 DIAGNOSIS — M21371 Foot drop, right foot: Secondary | ICD-10-CM | POA: Diagnosis not present

## 2020-02-26 DIAGNOSIS — R2681 Unsteadiness on feet: Secondary | ICD-10-CM | POA: Diagnosis not present

## 2020-02-26 DIAGNOSIS — R2689 Other abnormalities of gait and mobility: Secondary | ICD-10-CM | POA: Insufficient documentation

## 2020-02-26 DIAGNOSIS — M21372 Foot drop, left foot: Secondary | ICD-10-CM | POA: Diagnosis not present

## 2020-02-26 DIAGNOSIS — M6281 Muscle weakness (generalized): Secondary | ICD-10-CM

## 2020-02-26 NOTE — Therapy (Signed)
[facility] [address] [place], [place], [address] Phone: [phone]   Fax:  [phone]  Physical Therapy Treatment  Patient Details  Name: Johnathan Ross CHECK MRN: [id number] Date of Birth: 06-Mar-1971 Referring Provider (PT): Britt Bottom, MD   Encounter Date: 02/26/2020   PT End of Session - 02/26/20 1029    Visit Number 91    Number of Visits 93    Date for PT Re-Evaluation 03/08/20    Authorization Type Medicare Part A and B; 10th visit PN    Progress Note Due on Visit 100    PT Start Time 0846    PT Stop Time 0935    PT Time Calculation (min) 49 min    Activity Tolerance Patient tolerated treatment well    Behavior During Therapy [facility] for tasks assessed/performed           Past Medical History:  Diagnosis Date  . Hyperlipidemia   . Hypertension   . Hypogonadism in male 11/01/2016  . Hypothyroidism   . Multiple sclerosis ([place])   . Proliferative diabetic retinopathy(362.02)   . Type 1 diabetes mellitus ([place]) dx'd 1994  . Vitamin D insufficiency 11/29/2016    Past Surgical History:  Procedure Laterality Date  . EYE SURGERY Bilateral    "laser for diabetic retinopathy"  . FRACTURE SURGERY    . OPEN REDUCTION INTERNAL FIXATION (ORIF) TIBIA/FIBULA FRACTURE Right 10/30/2016  . ORIF ANKLE FRACTURE Left 2015  . ORIF TIBIA FRACTURE Right 10/30/2016   Procedure: OPEN REDUCTION INTERNAL FIXATION (ORIF) TIBIA FIBULA FRACTURE;  Surgeon: Altamese Butler, MD;  Location: [facility];  Service: Orthopedics;  Laterality: Right;  . RETINAL LASER PROCEDURE Bilateral    "for diabetic retinopathy"    There were no vitals filed for this visit.   Subjective Assessment - 02/26/20 0853    Subjective LLE dragging today with RW.  Appointment with neurologist on Tuesday.  No issues to report.    Patient is accompained by: Family member   mom in car   Pertinent History multiple falls, vitamin D insufficiency, Type 1 IDDM, diabetic  retinopathy, hypothyroidism, HTN, hyperlipidemia, R tibia fracture, L ankle fracture with ORIF    Limitations Walking    Diagnostic tests No new lesions seen on MRI    Patient Stated Goals To improve walking    Currently in Pain? No/denies                             [facility] Adult PT Treatment/Exercise - 02/26/20 1018      Ambulation/Gait   Ambulation/Gait Yes    Ambulation/Gait Assistance 4: Min assist;6: Modified independent (Device/Increase time)    Ambulation/Gait Assistance Details with RW pt is MOD I but continues to demonstrate intermittent L foot drag and has to utilize lifting through UE to swing LE through.  When ambulating with loftstrand crutches pt requires min A due to ongoing genu recurvatum and L foot drag.      Ambulation Distance (Feet) 115 Feet    Assistive device Rolling walker;Lofstrands    Gait Pattern Step-through pattern;Decreased hip/knee flexion - right;Decreased dorsiflexion - right;Poor foot clearance - right;Decreased step length - right;Narrow base of support;Decreased weight shift to right    Ambulation Surface Level;Indoor      Balance   Balance Assessed Yes      Dynamic Standing Balance   Dynamic Standing - Balance Support Bilateral upper extremity supported;No upper extremity supported  Dynamic Standing - Level of Assistance 4: Min assist    Dynamic Standing - Balance Activities Forward lean/weight shifting;Other (comment)    Forward lean/weight shifting comments: Standing with L foot wedged into DF, staggered stance, L foot forwards, R foot back performing 10 reps anterior/diagonal weight shifting to simulate weight shifting during gait; pt continued to demonstrate increased trunk flexion keeping COG posterior, attempted to incorporate UE swing overhead with LUE x 10 reps with weight shift for increased weight shift of COG.  Attempted to carryover weight shifting technique to gait with loftstrands; pt intermittently able to perform on LLE  in order to advance RLE but continues to have increased difficulty performing on RLE      Knee/Hip Exercises: Standing   Hip Abduction Stengthening;Both;2 sets;Knee bent    Abduction Limitations with green band around thighs performed side stepping to L and R along // bars x 2 reps focusing on weight shifting laterally and strengthening glute med               Balance Exercises - 02/26/20 1027      Balance Exercises: Standing   Turning Right;Left;10 reps;Limitations    Turning Limitations in // bars began with UE support and green theraband around LE performing resisted hip ER/ABD to start pivot x 10 reps to each side, alternating.  Then performed 90 deg turns against resistance of green theraband to L and R x 10 reps each with UE support.  Finally attempted to carryover technique with full 180 deg turns to L and R without band around LE and without UE support.  Continues to have greater difficulty shifting weight and balancing on RLE to pivot LLE             PT Education - 02/26/20 1029    Education Details discussed areas to discuss with neurologist on Tuesday including changes in LLE strength, increased LLE foot drag and decline in staning balance    Person(s) Educated Patient    Methods Explanation    Comprehension Verbalized understanding            PT Short Term Goals - 02/17/20 1014      PT SHORT TERM GOAL #1   Title Patient will demonstrate independence with updated HEP    Time 4    Period Weeks    Status Achieved    Target Date 02/05/20      PT SHORT TERM GOAL #2   Title Pt will improve gait velocity with cane to >/= 0.5 ft/sec    Baseline decreased since initial assessment    Time 4    Period Weeks    Status Not Met    Target Date 02/05/20      PT SHORT TERM GOAL #3   Title Pt will improve BERG to >/= 42/56    Baseline declined to 33/56    Time 4    Period Weeks    Status Not Met    Target Date 02/05/20      PT SHORT TERM GOAL #4   Title Pt will  ambulate x 115' over flat, indoor surfaces with cane and with min A and intermittent standing rest breaks.    Time 4    Period Weeks    Status Not Met    Target Date 02/05/20             PT Long Term Goals - 02/17/20 1205      PT LONG TERM GOAL #1   Title Pt  will demonstrate independence with land HEP  (LTG due by 03/08/20)    Time 8    Period Weeks    Status Revised      PT LONG TERM GOAL #2   Title Pt will improve gait velocity with cane to >/= 0.8 ft/sec    Baseline decreased to .26 ft/sec with cane    Time 8    Period Weeks    Status Revised      PT LONG TERM GOAL #3   Title Pt will improve BERG to >/= 45/56 to decrease falls risk    Baseline Decreased to 33/56    Time 8    Period Weeks    Status Revised      PT LONG TERM GOAL #4   Title Pt will ambulate x 200' outside over pavement and grass with supervision and cane to improve safety ambulating to wood working shop at home    Time 8    Period Weeks    Status Revised                 Plan - 02/26/20 1030    Clinical Impression Statement Continued to focus on core and proximal hip strengthening to improve stability when performing gait and pivoting/changing directions.  Pt continues to demonstrate greater difficulty with single limb support on RLE and increased difficulty with advancement and clearance of LLE.  Will continue to address and pt to discuss with neurology on Tuesday.    Comorbidities multiple falls, vitamin D insufficiency, Type 1 IDDM, diabetic retinopathy, hypothyroidism, HTN, hyperlipidemia, R tibia fracture, L ankle fracture with ORIF    Rehab Potential Good    PT Frequency 2x / week    PT Duration 8 weeks    PT Treatment/Interventions ADLs/Self Care Home Management;Aquatic Therapy;Electrical Stimulation;DME Instruction;Gait training;Stair training;Functional mobility training;Therapeutic activities;Therapeutic exercise;Balance training;Neuromuscular re-education;Patient/family  education;Orthotic Fit/Training;Passive range of motion;Energy conservation    PT Next Visit Plan How did appointment wtih Dr. Felecia Shelling go?  Recommend more therapy or will take a break?  Ankle strength and standing balance with feet in DF (wedge/ramp).  Postural work.  Hip rotation strengthening and working on pivoting/turning.  Gait with crutches; cane when appropriate    PT Home Exercise Plan Access Code: [email or web address] (updated on 02/05/20)    Consulted and Agree with Plan of Care Patient           Patient will benefit from skilled therapeutic intervention in order to improve the following deficits and impairments:  Abnormal gait, Decreased balance, Decreased endurance, Decreased coordination, Decreased range of motion, Decreased strength, Difficulty walking, Impaired tone, Postural dysfunction, Decreased activity tolerance, Decreased mobility  Visit Diagnosis: Unsteadiness on feet  Difficulty in walking, not elsewhere classified  Other abnormalities of gait and mobility  Muscle weakness (generalized)  Repeated falls  Foot drop, right  Foot drop, left     Problem List Patient Active Problem List   Diagnosis Date Noted  . Gait disturbance 08/19/2019  . Acquired diplegia ([place]) 08/19/2019  . Vitamin D insufficiency 11/29/2016  . Hypogonadism in male 11/01/2016  . 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[place])   . Hypothyroidism   . Hypertension   . Hyperlipidemia   . Closed fracture of right fibula and tibia 10/29/2016  . Closed tibia fracture 10/29/2016  . Closed fracture of right tibial plafond with fibula involvement 10/29/2016  . Abnormal liver function tests 03/06/2013  . RASH AND OTHER NONSPECIFIC SKIN ERUPTION 10/07/2009  . SHOULDER PAIN, LEFT 12/27/2008  .  Type I (juvenile type) diabetes mellitus without mention of complication, uncontrolled 12/03/2007  . HYPOTHYROIDISM 11/12/2007  . HYPERCHOLESTEROLEMIA 11/12/2007  . MULTIPLE SCLEROSIS 08/29/2007  .  PROLIFERATIVE DIABETIC RETINOPATHY 08/29/2007  . HYPERTENSION 12/25/2006    [name], PT, DPT 02/26/20    10:33 AM    [facility] [address] [place], [place], [address] Phone: [phone]   Fax:  [phone]  Name: TAHJIR SILVERIA MRN: [id number] Date of Birth: February 10, 1971

## 2020-02-29 ENCOUNTER — Other Ambulatory Visit: Payer: Self-pay

## 2020-02-29 ENCOUNTER — Ambulatory Visit (HOSPITAL_COMMUNITY)
Admission: RE | Admit: 2020-02-29 | Discharge: 2020-02-29 | Disposition: A | Discharge status: Home / Self Care | Payer: Medicare Other | Source: Ambulatory Visit | Attending: Cardiology | Admitting: Cardiology

## 2020-02-29 DIAGNOSIS — I82622 Acute embolism and thrombosis of deep veins of left upper extremity: Secondary | ICD-10-CM | POA: Diagnosis not present

## 2020-03-01 ENCOUNTER — Ambulatory Visit: Payer: Medicare Other | Admitting: Neurology

## 2020-03-01 ENCOUNTER — Encounter: Payer: Self-pay | Admitting: Neurology

## 2020-03-01 ENCOUNTER — Encounter: Payer: Self-pay | Admitting: Internal Medicine

## 2020-03-01 ENCOUNTER — Ambulatory Visit (INDEPENDENT_AMBULATORY_CARE_PROVIDER_SITE_OTHER): Payer: Medicare Other | Admitting: Internal Medicine

## 2020-03-01 VITALS — BP 116/74 | HR 94 | Temp 98.4°F | Resp 16 | Ht 73.0 in | Wt 198.0 lb

## 2020-03-01 DIAGNOSIS — R945 Abnormal results of liver function studies: Secondary | ICD-10-CM

## 2020-03-01 DIAGNOSIS — R058 Other specified cough: Secondary | ICD-10-CM

## 2020-03-01 DIAGNOSIS — Z23 Encounter for immunization: Secondary | ICD-10-CM

## 2020-03-01 DIAGNOSIS — T464X5A Adverse effect of angiotensin-converting-enzyme inhibitors, initial encounter: Secondary | ICD-10-CM

## 2020-03-01 DIAGNOSIS — I10 Essential (primary) hypertension: Secondary | ICD-10-CM

## 2020-03-01 DIAGNOSIS — E1043 Type 1 diabetes mellitus with diabetic autonomic (poly)neuropathy: Secondary | ICD-10-CM | POA: Diagnosis not present

## 2020-03-01 DIAGNOSIS — E559 Vitamin D deficiency, unspecified: Secondary | ICD-10-CM | POA: Diagnosis not present

## 2020-03-01 DIAGNOSIS — N400 Enlarged prostate without lower urinary tract symptoms: Secondary | ICD-10-CM

## 2020-03-01 DIAGNOSIS — E039 Hypothyroidism, unspecified: Secondary | ICD-10-CM

## 2020-03-01 DIAGNOSIS — Z1211 Encounter for screening for malignant neoplasm of colon: Secondary | ICD-10-CM

## 2020-03-01 DIAGNOSIS — E1065 Type 1 diabetes mellitus with hyperglycemia: Secondary | ICD-10-CM | POA: Insufficient documentation

## 2020-03-01 DIAGNOSIS — K3184 Gastroparesis: Secondary | ICD-10-CM | POA: Diagnosis not present

## 2020-03-01 DIAGNOSIS — R7989 Other specified abnormal findings of blood chemistry: Secondary | ICD-10-CM

## 2020-03-01 LAB — MICROALBUMIN / CREATININE URINE RATIO
Creatinine,U: 93.7 mg/dL
Microalb Creat Ratio: 0.7 mg/g (ref 0.0–30.0)
Microalb, Ur: <0.7 mg/dL (ref 0.0–1.9)

## 2020-03-01 LAB — TSH: TSH: 2.24 u[IU]/mL (ref 0.35–4.50)

## 2020-03-01 LAB — URINALYSIS, ROUTINE W REFLEX MICROSCOPIC
Bilirubin Urine: NEGATIVE
Hgb urine dipstick: NEGATIVE
Leukocytes,Ua: NEGATIVE
Nitrite: NEGATIVE
RBC / HPF: NONE SEEN (ref 0–?)
Specific Gravity, Urine: 1.02 (ref 1.000–1.030)
Total Protein, Urine: NEGATIVE
Urine Glucose: 500 — AB
Urobilinogen, UA: 0.2 (ref 0.0–1.0)
WBC, UA: NONE SEEN (ref 0–?)
pH: 7 (ref 5.0–8.0)

## 2020-03-01 LAB — PSA: PSA: 1.03 ng/mL (ref 0.10–4.00)

## 2020-03-01 NOTE — Patient Instructions (Signed)

## 2020-03-01 NOTE — Progress Notes (Addendum)
Subjective:  Patient ID: Johnathan Ross, male    DOB: February 02, 1971  Age: 49 y.o. MRN: [id number]  CC: Cough, Hypertension, and Diabetes  NEW TO ME   HPI Johnathan Ross presents for a CPX.  He complains of a 1 year history of intermittent nonproductive cough and tickling in his throat.  He also complains of a several month history of intermittent nausea and vomiting.  It happens about once every week or 2.  He denies odynophagia, dysphagia, choking, abdominal pain, loss of appetite, weight loss, diarrhea, constipation, bright red blood per rectum, or melena.  He does not feel the need to take anything for the symptoms.  Outpatient Medications Prior to Visit  Medication Sig Dispense Refill  . baclofen (LIORESAL) 10 MG tablet Take one or two pills three times a day (up to 6 pills/day) 180 each 11  . dalfampridine (AMPYRA) 10 MG TB12 Take 1 tablet (10 mg total) by mouth 2 (two) times daily. 180 tablet 3  . glucose blood (CONTOUR NEXT TEST) test strip USE AS INSTRUCTED TO CHECK BLOOD SUGARS 8 TIMES DAILY 300 strip 3  . insulin aspart (NOVOLOG) 100 UNIT/ML injection USE UP TO 80 UNITS IN INSULIN PUMP DAILY-DX CODE E10.8 80 mL 2  . simvastatin (ZOCOR) 20 MG tablet TAKE 1 TABLET BY MOUTH EVERYDAY AT BEDTIME 90 tablet 0  . SYNTHROID 125 MCG tablet TAKE 1 TABLET BY MOUTH 6 DAYS PER WEEK 72 tablet 1  . lisinopril (ZESTRIL) 10 MG tablet TAKE 1 TABLET BY MOUTH EVERY DAY 90 tablet 0   No facility-administered medications prior to visit.    ROS Review of Systems  Constitutional: Negative.  Negative for chills, diaphoresis, fatigue and fever.  HENT: Negative.  Negative for facial swelling, sinus pressure, trouble swallowing and voice change.   Eyes: Negative.  Negative for visual disturbance.  Respiratory: Positive for cough. Negative for chest tightness, shortness of breath and wheezing.   Cardiovascular: Negative for chest pain, palpitations and leg swelling.  Gastrointestinal: Positive for  nausea and vomiting. Negative for abdominal pain, anal bleeding, blood in stool, constipation and diarrhea.  Endocrine: Negative.  Negative for polydipsia, polyphagia and polyuria.  Genitourinary: Negative.  Negative for difficulty urinating, scrotal swelling and testicular pain.  Musculoskeletal: Positive for gait problem. Negative for arthralgias and myalgias.  Skin: Negative.  Negative for color change.  Neurological: Positive for weakness and numbness.  Hematological: Negative for adenopathy. Does not bruise/bleed easily.  Psychiatric/Behavioral: Negative.     Objective:  BP 116/74   Pulse 94   Temp 98.4 F (36.9 C) (Oral)   Resp 16   Ht 6\' 1"  (1.854 m)   Wt 198 lb (89.8 kg)   SpO2 97%   BMI 26.12 kg/m   BP Readings from Last 3 Encounters:  03/23/20 (!) 142/80  03/14/20 (!) 144/84  03/09/20 (!) 153/93    Wt Readings from Last 3 Encounters:  03/23/20 201 lb 8 oz (91.4 kg)  03/14/20 196 lb 8 oz (89.1 kg)  03/01/20 198 lb (89.8 kg)    Physical Exam Vitals reviewed. Exam conducted with a chaperone present (His sister).  Constitutional:      Appearance: Normal appearance.  HENT:     Nose: Nose normal.     Mouth/Throat:     Mouth: Mucous membranes are moist.     Pharynx: No oropharyngeal exudate or posterior oropharyngeal erythema.  Eyes:     General: No scleral icterus.    Conjunctiva/sclera: Conjunctivae normal.  Cardiovascular:  Rate and Rhythm: Normal rate and regular rhythm.     Heart sounds: No murmur heard.   Pulmonary:     Effort: Pulmonary effort is normal.     Breath sounds: No stridor. No wheezing, rhonchi or rales.  Abdominal:     General: Abdomen is flat. Bowel sounds are normal. There is no distension.     Palpations: Abdomen is soft. There is no hepatomegaly, splenomegaly or mass.     Tenderness: There is no abdominal tenderness.     Hernia: There is no hernia in the left inguinal area or right inguinal area.  Genitourinary:    Penis: Normal  and circumcised. No discharge, swelling or lesions.      Testes: Normal.        Right: Mass, tenderness or swelling not present.        Left: Mass, tenderness or swelling not present.     Epididymis:     Right: Normal.     Left: Normal.     Prostate: Normal. Not enlarged, not tender and no nodules present.     Rectum: Normal. Guaiac result negative. No mass, tenderness, anal fissure, external hemorrhoid or internal hemorrhoid. Normal anal tone.  Musculoskeletal:        General: Normal range of motion.     Cervical back: Neck supple.     Right lower leg: No edema.     Left lower leg: No edema.  Lymphadenopathy:     Cervical: No cervical adenopathy.     Lower Body: No right inguinal adenopathy. No left inguinal adenopathy.  Skin:    General: Skin is warm and dry.     Coloration: Skin is not jaundiced or pale.  Neurological:     Mental Status: He is alert. Mental status is at baseline.     Sensory: Sensory deficit present.     Motor: Weakness present.     Coordination: Coordination abnormal.     Gait: Gait abnormal.     Deep Tendon Reflexes: Reflexes abnormal.  Psychiatric:        Mood and Affect: Mood normal.        Behavior: Behavior normal.     Lab Results  Component Value Date   WBC 9.1 03/23/2020   HGB 15.5 03/23/2020   HCT 48.1 03/23/2020   PLT 281 03/23/2020   GLUCOSE 139 (H) 03/14/2020   CHOL 136 03/14/2020   TRIG 52.0 03/14/2020   HDL 46.70 03/14/2020   LDLDIRECT 139.7 04/09/2014   LDLCALC 79 03/14/2020   ALT 31 03/14/2020   AST 28 03/14/2020   NA 138 03/14/2020   K 4.2 03/14/2020   CL 100 03/14/2020   CREATININE 1.10 03/14/2020   BUN 14 03/14/2020   CO2 32 03/14/2020   TSH 6.17 (H) 03/14/2020   PSA 1.03 03/01/2020   HGBA1C 8.2 (H) 03/14/2020   MICROALBUR <0.7 03/01/2020    VAS [place] UPPER EXTREMITY VENOUS DUPLEX  Result Date: 02/29/2020 UPPER VENOUS STUDY  Indications: Patient reports feeling a knot under his left arm in September 2020 that prompted  him to get an ultrasound of the left upper extremity. Today, he is here for a follow-up from the exams performed in 01/2019. He denies any SOB. Risk Factors: Trauma to both ankles x 1 year per patient, Multiple Sclerosis and Diabetes. Comparison Study: On 02/21/2019 and 02/25/2019, a venous duplex of the left                   upper extremity  was performed that showed an acute deep venous                   thrombosis in the left subclavian and axillary veins. Performing Technologist: Sharlett Iles RVT  Examination Guidelines: A complete evaluation includes B-mode imaging, spectral Doppler, color Doppler, and power Doppler as needed of all accessible portions of each vessel. Bilateral testing is considered an integral part of a complete examination. Limited examinations for reoccurring indications may be performed as noted.  Right Findings: +----------+------------+---------+-----------+----------+--------------------+ RIGHT     CompressiblePhasicitySpontaneousProperties      Summary        +----------+------------+---------+-----------+----------+--------------------+ Subclavian               Yes       Yes                 Difficulty to                                                              visualize       +----------+------------+---------+-----------+----------+--------------------+  Left Findings: +----------+------------+---------+-----------+----------+-------+ LEFT      CompressiblePhasicitySpontaneousPropertiesSummary +----------+------------+---------+-----------+----------+-------+ IJV                      Yes       Yes                      +----------+------------+---------+-----------+----------+-------+ Subclavian    Full       Yes       Yes                      +----------+------------+---------+-----------+----------+-------+ Axillary      Full       Yes       Yes                       +----------+------------+---------+-----------+----------+-------+ Brachial      Full       Yes       Yes                      +----------+------------+---------+-----------+----------+-------+ Radial        Full       Yes       Yes                      +----------+------------+---------+-----------+----------+-------+ Ulnar         Full       Yes       Yes                      +----------+------------+---------+-----------+----------+-------+ Cephalic      Full       Yes       Yes                      +----------+------------+---------+-----------+----------+-------+ Basilic       Full       Yes       Yes                      +----------+------------+---------+-----------+----------+-------+  Summary:  Right: No evidence of thrombosis in the subclavian.  Left: No evidence of deep vein thrombosis in the upper extremity. No evidence of superficial vein thrombosis in the upper extremity. Findings for deep vein thrombosis appear resolved from previous study.  *See table(s) above for measurements and observations.  Diagnosing physician: Quay Burow MD    Preliminary     Assessment & Plan:   Rawn was seen today for cough, hypertension and diabetes.  Diagnoses and all orders for this visit:  Vitamin D insufficiency  Benign prostatic hyperplasia without lower urinary tract symptoms- His PSA is normal which is a reassuring sign that he does not have prostate cancer.  He has no symptoms that need to be treated. -     PSA; Future -     PSA  Primary hypertension- His blood pressure is well controlled and there is no protein in his urine.  He has developed a cough from his ACE inhibitor.  I have asked him to stop taking the ACE inhibitor and for now I do not think he needs an antihypertensive.  In the future may start an ARB. -     Urinalysis, Routine w reflex microscopic; Future -     Urinalysis, Routine w reflex microscopic  Hypothyroidism, unspecified type- His TSH  is in the normal range.  He will stay on the current dose of levothyroxine. -     TSH; Future -     TSH  Uncontrolled type 1 diabetes mellitus with hyperglycemia ([place])- This is managed by endocrinology. -     Microalbumin / creatinine urine ratio; Future -     Urinalysis, Routine w reflex microscopic; Future -     Hepatitis B surface antibody,quantitative; Future -     Hepatitis B surface antigen; Future -     Hepatitis A antibody, total; Future -     Microalbumin / creatinine urine ratio -     Urinalysis, Routine w reflex microscopic -     Hepatitis B surface antibody,quantitative -     Hepatitis B surface antigen -     Hepatitis A antibody, total -     HM Diabetes Foot Exam  Abnormal liver function tests- His recent LFTs were normal.  Screening for viral hepatitis is negative.  I recommended that he get vaccinated against hepatitis a and B. -     Hepatitis B surface antibody,quantitative; Future -     Hepatitis B surface antigen; Future -     Hepatitis A antibody, total; Future -     Hepatitis B surface antibody,quantitative -     Hepatitis B surface antigen -     Hepatitis A antibody, total  Diabetic gastroparesis associated with type 1 diabetes mellitus ([place])- We discussed treatment options but he says the symptoms are not severe enough for him to need a medication to treat this.  Cough due to ACE inhibitor- He will stop taking the ACE inhibitor.  Other orders -     Flu Vaccine QUAD 6+ mos PF IM (Fluarix Quad PF) -     Pneumococcal polysaccharide vaccine 23-valent greater than or equal to 2yo subcutaneous/IM   I have discontinued Carolyn E. Rader's lisinopril. I am also having him maintain his Contour Next Test, insulin aspart, baclofen, dalfampridine, Synthroid, and simvastatin.  No orders of the defined types were placed in this encounter.  I spent 50 minutes in preparing to see the patient by review of recent labs, imaging and procedures, obtaining and reviewing  separately obtained history, communicating with the patient and family or caregiver, ordering  medications, tests or procedures, and documenting clinical information in the EHR including the differential Dx, treatment, and any further evaluation and other management of 1. Vitamin D insufficiency 2. Benign prostatic hyperplasia without lower urinary tract symptoms 3. Primary hypertension 4. Hypothyroidism, unspecified type 5. Uncontrolled type 1 diabetes mellitus with hyperglycemia ([place]) 6. Abnormal liver function tests 7. Diabetic gastroparesis associated with type 1 diabetes mellitus ([place]) 8. Cough due to ACE inhibitor    Follow-up: Return in about 6 months (around 08/30/2020).  Scarlette Calico, MD

## 2020-03-02 ENCOUNTER — Ambulatory Visit: Payer: Medicare Other | Admitting: Physical Therapy

## 2020-03-02 LAB — HEPATITIS B SURFACE ANTIBODY, QUANTITATIVE: Hep B S AB Quant (Post): <5 m[IU]/mL — ABNORMAL LOW (ref >=10)

## 2020-03-02 LAB — HEPATITIS A ANTIBODY, TOTAL: Hepatitis A AB,Total: NONREACTIVE

## 2020-03-02 LAB — HEPATITIS B SURFACE ANTIGEN: Hepatitis B Surface Ag: NONREACTIVE

## 2020-03-03 ENCOUNTER — Other Ambulatory Visit: Payer: Self-pay

## 2020-03-03 ENCOUNTER — Encounter: Payer: Self-pay | Admitting: Family Medicine

## 2020-03-03 ENCOUNTER — Telehealth (INDEPENDENT_AMBULATORY_CARE_PROVIDER_SITE_OTHER): Payer: Medicare Other | Admitting: Family Medicine

## 2020-03-03 DIAGNOSIS — R509 Fever, unspecified: Secondary | ICD-10-CM

## 2020-03-03 DIAGNOSIS — R058 Other specified cough: Secondary | ICD-10-CM | POA: Insufficient documentation

## 2020-03-03 DIAGNOSIS — R059 Cough, unspecified: Secondary | ICD-10-CM

## 2020-03-03 DIAGNOSIS — R52 Pain, unspecified: Secondary | ICD-10-CM

## 2020-03-03 DIAGNOSIS — R5383 Other fatigue: Secondary | ICD-10-CM

## 2020-03-03 DIAGNOSIS — T464X5A Adverse effect of angiotensin-converting-enzyme inhibitors, initial encounter: Secondary | ICD-10-CM | POA: Insufficient documentation

## 2020-03-03 NOTE — Progress Notes (Signed)
Virtual Visit via Video Note  I connected with Johnathan Ross  on 03/03/20 at 12:20 PM EDT by a video enabled telemedicine application and verified that I am speaking with the correct person using two identifiers.  Location patient: home Location provider:work or home office Persons participating in the virtual visit: patient, provider  I discussed the limitations of evaluation and management by telemedicine and the availability of in person appointments. The patient expressed understanding and agreed to proceed.   HPI:  Acute telemedicine visit for possible vaccine side effects: -had 2nd covid shot (moderna) 4 days ago -2 days after the shot had a fever around 100, body aches, felt tired, chills, mild cough (but has had this chronically and PCP just took him off of his BP med a few days ago due to this) and they are doing an evaluation - not worse -is feeling better today -Denies: SOB, NVD, worsening cough, inability to get out of bed -Pertinent past medical history: MS, diabetes -Pertinent medication allergies: lisinopril recently held due to chronic cough -COVID-19 vaccine status: Moderna 2nd dose 4 days ago  ROS: See pertinent positives and negatives per HPI.  Past Medical History:  Diagnosis Date  . Hyperlipidemia   . Hypertension   . Hypogonadism in male 11/01/2016  . Hypothyroidism   . Multiple sclerosis ([place])   . Proliferative diabetic retinopathy(362.02)   . Type 1 diabetes mellitus ([place]) dx'd 1994  . Vitamin D insufficiency 11/29/2016    Past Surgical History:  Procedure Laterality Date  . EYE SURGERY Bilateral    "laser for diabetic retinopathy"  . FRACTURE SURGERY    . OPEN REDUCTION INTERNAL FIXATION (ORIF) TIBIA/FIBULA FRACTURE Right 10/30/2016  . ORIF ANKLE FRACTURE Left 2015  . ORIF TIBIA FRACTURE Right 10/30/2016   Procedure: OPEN REDUCTION INTERNAL FIXATION (ORIF) TIBIA FIBULA FRACTURE;  Surgeon: Altamese Avoca, MD;  Location: [facility];  Service: Orthopedics;   Laterality: Right;  . RETINAL LASER PROCEDURE Bilateral    "for diabetic retinopathy"     Current Outpatient Medications:  .  baclofen (LIORESAL) 10 MG tablet, Take one or two pills three times a day (up to 6 pills/day), Disp: 180 each, Rfl: 11 .  dalfampridine (AMPYRA) 10 MG TB12, Take 1 tablet (10 mg total) by mouth 2 (two) times daily., Disp: 180 tablet, Rfl: 3 .  glucose blood (CONTOUR NEXT TEST) test strip, USE AS INSTRUCTED TO CHECK BLOOD SUGARS 8 TIMES DAILY, Disp: 300 strip, Rfl: 3 .  insulin aspart (NOVOLOG) 100 UNIT/ML injection, USE UP TO 80 UNITS IN INSULIN PUMP DAILY-DX CODE E10.8, Disp: 80 mL, Rfl: 2 .  simvastatin (ZOCOR) 20 MG tablet, TAKE 1 TABLET BY MOUTH EVERYDAY AT BEDTIME, Disp: 90 tablet, Rfl: 0 .  SYNTHROID 125 MCG tablet, TAKE 1 TABLET BY MOUTH 6 DAYS PER WEEK, Disp: 72 tablet, Rfl: 1  EXAM:  VITALS per patient if applicable:  GENERAL: alert, oriented, appears well and in no acute distress  HEENT: atraumatic, conjunttiva clear, no obvious abnormalities on inspection of external nose and ears  NECK: normal movements of the head and neck  LUNGS: on inspection no signs of respiratory distress, breathing rate appears normal, no obvious gross SOB, gasping or wheezing  CV: no obvious cyanosis  MS: moves all visible extremities without noticeable abnormality  PSYCH/NEURO: pleasant and cooperative, no obvious depression or anxiety, speech and thought processing grossly intact  ASSESSMENT AND PLAN:  Discussed the following assessment and plan:  Low grade fever  Body aches  Other fatigue  Cough  -we discussed possible serious and likely etiologies, options for evaluation and workup, limitations of telemedicine visit vs in person visit, treatment, treatment risks and precautions. Pt prefers to treat via telemedicine empirically rather than in person at this moment.  Query vaccine side effects, versus viral illness versus other.  Discussed possibility of Covid.   Discussed possibility of influenza, though he is feeling better now, which suggest other diagnosis or vaccine side effects.  Since doing better, he opted for symptomatic care with NSAIDs or acetaminophen and oral hydration. Discussed COVID-19 testing options and isolation pending results.  Discussed treatment options should that be positive and advised follow-up with PCP. Work/School slipped offered: provided in patient instructions: declined Scheduled follow up with PCP offered: Declined  -he agrees to call if follow-up is needed. Advised to seek prompt follow up telemedicine visit or in person care if worsening, new symptoms arise, or if is not improving with treatment. Did let this patient know that I only do telemedicine on Tuesdays and Thursdays for [place]. Advised to schedule follow up visit with PCP or [facility] if any further questions or concerns to avoid delays in care.   I discussed the assessment and treatment plan with the patient. The patient was provided an opportunity to ask questions and all were answered. The patient agreed with the plan and demonstrated an understanding of the instructions.     Lucretia Kern, DO

## 2020-03-03 NOTE — Patient Instructions (Addendum)
-  stay home while sick, and if you have [place] please stay home for a full 10 days since the onset of symptoms PLUS one day of no fever and feeling better  -[place] COVID19 testing information: [email or web address].[email or web address] OR [id number]  -Please report the vaccine side effects using the information provided at the time of your vaccine.  -can use tylenol or aleve if needed for fevers, aches and pains per instructions  -stay hydrated, drink plenty of fluids and eat small healthy meals  -follow up with your doctor in 2-3 days unless improving and feeling better  I hope you are feeling all better soon! Seek in-person care or a follow up telemedicine visit promptly if your symptoms worsen, new concerns arise or you are not improving as expected. Call 911 if severe symptoms.

## 2020-03-07 NOTE — Addendum Note (Signed)
Addended by: Hinda Kehr on: 03/07/2020 03:47 PM   Modules accepted: Orders

## 2020-03-09 ENCOUNTER — Other Ambulatory Visit: Payer: Self-pay

## 2020-03-09 ENCOUNTER — Encounter: Payer: Self-pay | Admitting: Physical Therapy

## 2020-03-09 ENCOUNTER — Ambulatory Visit: Payer: Medicare Other | Admitting: Physical Therapy

## 2020-03-09 VITALS — BP 153/93 | HR 77

## 2020-03-09 DIAGNOSIS — R2681 Unsteadiness on feet: Secondary | ICD-10-CM

## 2020-03-09 DIAGNOSIS — R262 Difficulty in walking, not elsewhere classified: Secondary | ICD-10-CM

## 2020-03-09 DIAGNOSIS — R2689 Other abnormalities of gait and mobility: Secondary | ICD-10-CM

## 2020-03-09 DIAGNOSIS — R296 Repeated falls: Secondary | ICD-10-CM | POA: Diagnosis not present

## 2020-03-09 DIAGNOSIS — M6281 Muscle weakness (generalized): Secondary | ICD-10-CM | POA: Diagnosis not present

## 2020-03-09 DIAGNOSIS — M21372 Foot drop, left foot: Secondary | ICD-10-CM

## 2020-03-09 DIAGNOSIS — M21371 Foot drop, right foot: Secondary | ICD-10-CM | POA: Diagnosis not present

## 2020-03-09 NOTE — Therapy (Signed)
[facility] [address] [place], [place], [address] Phone: [phone]   Fax:  [phone]  Physical Therapy Treatment  Patient Details  Name: Johnathan Ross MRN: [id number] Date of Birth: Apr 11, 1971 Referring Provider (PT): Britt Bottom, MD   Encounter Date: 03/09/2020   PT End of Session - 03/09/20 0904    Visit Number 70    Number of Visits 93    Date for PT Re-Evaluation 03/08/20    Authorization Type Medicare Part A and B; 10th visit PN    Progress Note Due on Visit 100    PT Start Time 0847    PT Stop Time 0930    PT Time Calculation (min) 43 min    Equipment Utilized During Treatment Gait belt    Activity Tolerance Patient tolerated treatment well;Patient limited by fatigue    Behavior During Therapy WFL for tasks assessed/performed           Past Medical History:  Diagnosis Date  . Hyperlipidemia   . Hypertension   . Hypogonadism in male 11/01/2016  . Hypothyroidism   . Multiple sclerosis ([place])   . Proliferative diabetic retinopathy(362.02)   . Type 1 diabetes mellitus ([place]) dx'd 1994  . Vitamin D insufficiency 11/29/2016    Past Surgical History:  Procedure Laterality Date  . EYE SURGERY Bilateral    "laser for diabetic retinopathy"  . FRACTURE SURGERY    . OPEN REDUCTION INTERNAL FIXATION (ORIF) TIBIA/FIBULA FRACTURE Right 10/30/2016  . ORIF ANKLE FRACTURE Left 2015  . ORIF TIBIA FRACTURE Right 10/30/2016   Procedure: OPEN REDUCTION INTERNAL FIXATION (ORIF) TIBIA FIBULA FRACTURE;  Surgeon: Altamese Blaine, MD;  Location: [facility];  Service: Orthopedics;  Laterality: Right;  . RETINAL LASER PROCEDURE Bilateral    "for diabetic retinopathy"    Vitals:   03/09/20 0854 03/09/20 0910 03/09/20 0914 03/09/20 0927  BP: (!) 158/99 (!) 154/100 (!) 148/94 (!) 153/93  Pulse:    77     Subjective Assessment - 03/09/20 0854    Subjective Had his second Moderna vaccine on Sunday. Was "really weak" for  several days afterwards, had to have help to move anywhere as he could not stand on his feet with the walker. Dad "picked me up", "I couldnt even crawl". By Thursday was able to stand at walker, however could not lift his feet to step, had to use arms to "swing them". Feeling a little better day, "I have strength back, but I still feel weakness in my legs". Saw new primary MD who changed a med- stopped his BP med Lisinopril. Missed his appt with Dr. Felecia Shelling as he states he did not know about it. On review of his schedule it appears the office left a voice mail that he says he did not get. With all the weakness last week had one fall this past Saturday, denies injury. Was able to get himself up.    Patient is accompained by: Family member   mom and dad in car   Pertinent History multiple falls, vitamin D insufficiency, Type 1 IDDM, diabetic retinopathy, hypothyroidism, HTN, hyperlipidemia, R tibia fracture, L ankle fracture with ORIF    Limitations Walking    Diagnostic tests No new lesions seen on MRI    Patient Stated Goals To improve walking    Currently in Pain? No/denies    Pain Score 0-No pain                 [facility] Adult PT  Treatment/Exercise - 03/09/20 0905      Transfers   Transfers Sit to Stand;Stand to Sit    Sit to Stand 6: Modified independent (Device/Increase time)    Stand to Sit 6: Modified independent (Device/Increase time)      Ambulation/Gait   Ambulation/Gait Yes    Ambulation/Gait Assistance 5: Supervision;6: Modified independent (Device/Increase time)    Assistive device Rolling walker    Gait Pattern Step-through pattern;Decreased hip/knee flexion - right;Decreased dorsiflexion - right;Poor foot clearance - right;Decreased step length - right;Narrow base of support;Decreased weight shift to right    Ambulation Surface Level;Indoor      Self-Care   Self-Care Other Self-Care Comments    Other Self-Care Comments  discussed elevated BP and pt is to call his new primary  MD. Pt reports no issues with new HEP, he was able to perform them on Saturday. Did not perform them last week due to extreme weakness following his second Covid vaccine.      Neuro Re-ed    Neuro Re-ed Details  for strengthening/muscle re-ed: with forefeet on blue wedge: no UE support for head movements left<>right, up<>down with min to mod assist needed due to posterior balance losses;  with feet flat on floor no UE support to occasional touch to sturdy surface- alternating UE raises, progressing to bil UE raises with min guard to min assist for balance. then EC 30 sec holds, progressing to EC head movements left<>right, up<>down with up to min assist.                     PT Short Term Goals - 02/17/20 1014      PT SHORT TERM GOAL #1   Title Patient will demonstrate independence with updated HEP    Time 4    Period Weeks    Status Achieved    Target Date 02/05/20      PT SHORT TERM GOAL #2   Title Pt will improve gait velocity with cane to >/= 0.5 ft/sec    Baseline decreased since initial assessment    Time 4    Period Weeks    Status Not Met    Target Date 02/05/20      PT SHORT TERM GOAL #3   Title Pt will improve BERG to >/= 42/56    Baseline declined to 33/56    Time 4    Period Weeks    Status Not Met    Target Date 02/05/20      PT SHORT TERM GOAL #4   Title Pt will ambulate x 115' over flat, indoor surfaces with cane and with min A and intermittent standing rest breaks.    Time 4    Period Weeks    Status Not Met    Target Date 02/05/20             PT Long Term Goals - 03/09/20 0904      PT LONG TERM GOAL #1   Title Pt will demonstrate independence with land HEP  (LTG due by 03/08/20)    Baseline 03/09/20: met with current program    Status Achieved      PT LONG TERM GOAL #2   Title Pt will improve gait velocity with cane to >/= 0.8 ft/sec    Baseline decreased to .26 ft/sec with cane    Time 8    Period Weeks    Status Revised      PT  LONG TERM GOAL #3  Title Pt will improve BERG to >/= 45/56 to decrease falls risk    Baseline Decreased to 33/56    Time 8    Period Weeks    Status Revised      PT LONG TERM GOAL #4   Title Pt will ambulate x 200' outside over pavement and grass with supervision and cane to improve safety ambulating to wood working shop at home    Time 8    Period Weeks    Status Revised                 Plan - 03/09/20 0904    Clinical Impression Statement Today's skilled session was limited due to elevated BP readings. Pt is to notify his primary MD of BP readings today. Began to address LTGs with goal #1 met. Did not address gait goals due to unsafe to attempt cane today due to pt barely able to left LE's with gait with RW, using a swing through method mostly. Did work on unsupported static balance with no increase in BP with activity.    Comorbidities multiple falls, vitamin D insufficiency, Type 1 IDDM, diabetic retinopathy, hypothyroidism, HTN, hyperlipidemia, R tibia fracture, L ankle fracture with ORIF    Rehab Potential Good    PT Frequency 2x / week    PT Duration 8 weeks    PT Treatment/Interventions ADLs/Self Care Home Management;Aquatic Therapy;Electrical Stimulation;DME Instruction;Gait training;Stair training;Functional mobility training;Therapeutic activities;Therapeutic exercise;Balance training;Neuromuscular re-education;Patient/family education;Orthotic Fit/Training;Passive range of motion;Energy conservation    PT Next Visit Plan spoke with primary PT- at this time leaning towards placing pt on hold with PT until after he is able to see Dr. Felecia Shelling. ? assess LTGs    PT Home Exercise Plan Access Code: [email or web address] (updated on 02/05/20)    Consulted and Agree with Plan of Care Patient           Patient will benefit from skilled therapeutic intervention in order to improve the following deficits and impairments:  Abnormal gait, Decreased balance, Decreased endurance, Decreased  coordination, Decreased range of motion, Decreased strength, Difficulty walking, Impaired tone, Postural dysfunction, Decreased activity tolerance, Decreased mobility  Visit Diagnosis: Unsteadiness on feet  Difficulty in walking, not elsewhere classified  Other abnormalities of gait and mobility  Muscle weakness (generalized)  Repeated falls  Foot drop, left     Problem List Patient Active Problem List   Diagnosis Date Noted  . Cough due to ACE inhibitor 03/03/2020  . Benign prostatic hyperplasia without lower urinary tract symptoms 03/01/2020  . Diabetic gastroparesis associated with type 1 diabetes mellitus ([facility]) 03/01/2020  . Uncontrolled type 1 diabetes mellitus with hyperglycemia ([facility]) 03/01/2020  . Gait disturbance 08/19/2019  . Acquired diplegia ([facility]) 08/19/2019  . Vitamin D insufficiency 11/29/2016  . Hypogonadism in male 11/01/2016  . 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[facility])   . Hypothyroidism   . Hypertension   . Abnormal liver function tests 03/06/2013  . Type I (juvenile type) diabetes mellitus without mention of complication, uncontrolled 12/03/2007  . HYPERCHOLESTEROLEMIA 11/12/2007  . MULTIPLE SCLEROSIS 08/29/2007  . PROLIFERATIVE DIABETIC RETINOPATHY 08/29/2007    Willow Ora, PTA, [facility] [address] [place], [place] [address] [phone] 03/09/20, 11:42 AM   Name: JAYTON POPELKA MRN: [id number] Date of Birth: May 03, 1971

## 2020-03-11 ENCOUNTER — Other Ambulatory Visit: Payer: Self-pay

## 2020-03-11 ENCOUNTER — Encounter: Payer: Self-pay | Admitting: Physical Therapy

## 2020-03-11 ENCOUNTER — Ambulatory Visit: Payer: Medicare Other | Admitting: Physical Therapy

## 2020-03-11 DIAGNOSIS — R2689 Other abnormalities of gait and mobility: Secondary | ICD-10-CM | POA: Diagnosis not present

## 2020-03-11 DIAGNOSIS — M21371 Foot drop, right foot: Secondary | ICD-10-CM

## 2020-03-11 DIAGNOSIS — R262 Difficulty in walking, not elsewhere classified: Secondary | ICD-10-CM

## 2020-03-11 DIAGNOSIS — R2681 Unsteadiness on feet: Secondary | ICD-10-CM | POA: Diagnosis not present

## 2020-03-11 DIAGNOSIS — M6281 Muscle weakness (generalized): Secondary | ICD-10-CM | POA: Diagnosis not present

## 2020-03-11 DIAGNOSIS — R296 Repeated falls: Secondary | ICD-10-CM

## 2020-03-11 DIAGNOSIS — M21372 Foot drop, left foot: Secondary | ICD-10-CM

## 2020-03-12 NOTE — Therapy (Addendum)
[facility] [address] [place], [place], [address] Phone: [phone]   Fax:  [phone]  Physical Therapy Treatment Addendum on 08/02/20 for D/C  Patient Details  Name: Johnathan Ross MRN: [id number] Date of Birth: 1971-02-08 Referring Provider (PT): Britt Bottom, MD   Encounter Date: 03/11/2020   PT End of Session - 03/12/20 1255    Visit Number 93    Number of Visits 41    Date for PT Re-Evaluation 03/12/20    Authorization Type Medicare Part A and B; 10th visit PN    Progress Note Due on Visit 100    PT Start Time 0850    PT Stop Time 0930    PT Time Calculation (min) 40 min    Activity Tolerance Patient limited by fatigue;Other (comment)   weakness   Behavior During Therapy WFL for tasks assessed/performed           Past Medical History:  Diagnosis Date  . Hyperlipidemia   . Hypertension   . Hypogonadism in male 11/01/2016  . Hypothyroidism   . Multiple sclerosis ([facility])   . Proliferative diabetic retinopathy(362.02)   . Type 1 diabetes mellitus ([facility]) dx'd 1994  . Vitamin D insufficiency 11/29/2016    Past Surgical History:  Procedure Laterality Date  . EYE SURGERY Bilateral    "laser for diabetic retinopathy"  . FRACTURE SURGERY    . OPEN REDUCTION INTERNAL FIXATION (ORIF) TIBIA/FIBULA FRACTURE Right 10/30/2016  . ORIF ANKLE FRACTURE Left 2015  . ORIF TIBIA FRACTURE Right 10/30/2016   Procedure: OPEN REDUCTION INTERNAL FIXATION (ORIF) TIBIA FIBULA FRACTURE;  Surgeon: Altamese Worcester, MD;  Location: [facility];  Service: Orthopedics;  Laterality: Right;  . RETINAL LASER PROCEDURE Bilateral    "for diabetic retinopathy"    There were no vitals filed for this visit.   Subjective Assessment - 03/11/20 0855    Subjective Is still getting his strength back.  [facility] on [facility] to figure out where the blood clot came from.  Has make up appointment with Dr. Felecia Shelling on  Wednesday.  New Baclofen prescription is lower than previous.    Patient is accompained by: Family member   mom and dad in car   Pertinent History multiple falls, vitamin D insufficiency, Type 1 IDDM, diabetic retinopathy, hypothyroidism, HTN, hyperlipidemia, R tibia fracture, L ankle fracture with ORIF    Limitations Walking    Diagnostic tests No new lesions seen on MRI    Patient Stated Goals To improve walking    Currently in Pain? No/denies              [facility] PT Assessment - 03/12/20 1251      Assessment   Medical Diagnosis MS    Referring Provider (PT) Sater, Nanine Means, MD    Onset Date/Surgical Date 12/15/18    Hand Dominance Right    Prior Therapy yes, outpatient PT following fall with ankle fracture      Precautions   Precautions Other (comment)    Precaution Comments multiple falls, vitamin D insufficiency, Type 1 IDDM, diabetic retinopathy, hypothyroidism, HTN, hyperlipidemia, R tibia fracture, L ankle fracture with ORIF    Required Braces or Orthoses Other Brace/Splint    Other Brace/Splint AFO RLE      Restrictions   Weight Bearing Restrictions No      Prior Function   Level of Independence Independent    Leisure Does a lot of wood working in his building  Observation/Other Assessments   Focus on Therapeutic Outcomes (FOTO)  N/A      Ambulation/Gait   Ambulation/Gait Yes    Ambulation/Gait Assistance 5: Supervision    Ambulation/Gait Assistance Details with increased weakness pt required a rest break every 2-3 steps due to L foot/toe drag.  Continues to need to lift with UE and swing LE through    Ambulation Distance (Feet) 100 Feet    Assistive device Rolling walker    Gait Pattern Step-to pattern;Decreased step length - right;Decreased step length - left;Decreased stride length;Decreased hip/knee flexion - right;Decreased hip/knee flexion - left;Decreased dorsiflexion - right;Decreased dorsiflexion - left;Right genu recurvatum;Left genu recurvatum;Poor  foot clearance - left    Ambulation Surface Level;Indoor      Standardized Balance Assessment   10 Meter Walk Not able to assess today due to increased weakness      [employer] comment: did not assess today due to increased weakness                         [facility] Adult PT Treatment/Exercise - 03/11/20 0900      Exercises   Exercises Other Exercises    Other Exercises  Core exercises passing yoga block between hands and bilat knees x 2 sets x 4 reps.  Curling up to place between knees > reverse crunch to bring block back to hands      Knee/Hip Exercises: Supine   Bridges Limitations Partial single leg, one foot on yoga block to shift COG and activation to contralateral LE    Single Leg Bridge Strengthening;Right;Left;1 set;10 reps;Limitations    Knee Flexion Strengthening;Both;2 sets;10 reps    Knee Flexion Limitations Hamstring curls with feet on physioball, performed bilaterally with multiple rest breaks      Knee/Hip Exercises: Sidelying   Hip ABduction Strengthening;Right;Left;1 set;Limitations   12 repetitions each side, top leg extended                 PT Education - 03/12/20 1249    Education Details informed pt that there was no appointment showing in [facility] for Wednesday with neurology; advised pt to follow up with neurology office; will go on hold after today's session and will await further information/guidance from neurologist's assessment.    Person(s) Educated Patient    Methods Explanation    Comprehension Verbalized understanding            PT Short Term Goals - 02/17/20 1014      PT SHORT TERM GOAL #1   Title Patient will demonstrate independence with updated HEP    Time 4    Period Weeks    Status Achieved    Target Date 02/05/20      PT SHORT TERM GOAL #2   Title Pt will improve gait velocity with cane to >/= 0.5 ft/sec    Baseline decreased since initial assessment    Time 4    Period Weeks    Status Not Met     Target Date 02/05/20      PT SHORT TERM GOAL #3   Title Pt will improve BERG to >/= 42/56    Baseline declined to 33/56    Time 4    Period Weeks    Status Not Met    Target Date 02/05/20      PT SHORT TERM GOAL #4   Title Pt will ambulate x 115' over flat, indoor surfaces with cane and with  min A and intermittent standing rest breaks.    Time 4    Period Weeks    Status Not Met    Target Date 02/05/20             PT Long Term Goals - 03/12/20 1256      PT LONG TERM GOAL #1   Title Pt will demonstrate independence with land HEP    Baseline 03/09/20: met with current program    Status Achieved      PT LONG TERM GOAL #2   Title Pt will improve gait velocity with cane to >/= 0.8 ft/sec    Baseline decreased to .26 ft/sec with cane    Time 8    Period Weeks    Status Unable to assess      PT LONG TERM GOAL #3   Title Pt will improve BERG to >/= 45/56 to decrease falls risk    Baseline Decreased to 33/56    Time 8    Period Weeks    Status Not Met      PT LONG TERM GOAL #4   Title Pt will ambulate x 200' outside over pavement and grass with supervision and cane to improve safety ambulating to wood working shop at home    Time 8    Period Weeks    Status Not Met                 Plan - 03/12/20 1256    Clinical Impression Statement Unable to formally assess LTG today as pt is continuing to demonstrate increased weakness, instability and gait difficulty after recent reaction to vaccines.  Patient has been demonstrating a decline in strength, balance and mobility over the past 1-2 months despite consistent therapy visits and pt performing HEP.  Pt to follow up with neurology next week for reassessment and then will follow up with PT regarding plan for further visits or D/C for now.  Treatment session focused on LE strengthening in supine, quadruped and sidelying.  Pt tolerated exercises well and agrees with plan.    Comorbidities multiple falls, vitamin D  insufficiency, Type 1 IDDM, diabetic retinopathy, hypothyroidism, HTN, hyperlipidemia, R tibia fracture, L ankle fracture with ORIF    Rehab Potential Good    PT Frequency 2x / week    PT Duration Other (comment)   1 week from 10/12 - 10/16   PT Treatment/Interventions ADLs/Self Care Home Management;Aquatic Therapy;Electrical Stimulation;DME Instruction;Gait training;Stair training;Functional mobility training;Therapeutic activities;Therapeutic exercise;Balance training;Neuromuscular re-education;Patient/family education;Orthotic Fit/Training;Passive range of motion;Energy conservation    PT Next Visit Plan recertify or D/C?    PT Home Exercise Plan Access Code: [email or web address] (updated on 02/05/20)    Consulted and Agree with Plan of Care Patient           Patient will benefit from skilled therapeutic intervention in order to improve the following deficits and impairments:  Abnormal gait, Decreased balance, Decreased endurance, Decreased coordination, Decreased range of motion, Decreased strength, Difficulty walking, Impaired tone, Postural dysfunction, Decreased activity tolerance, Decreased mobility  Visit Diagnosis: Unsteadiness on feet  Difficulty in walking, not elsewhere classified  Other abnormalities of gait and mobility  Muscle weakness (generalized)  Repeated falls  Foot drop, left  Foot drop, right     Problem List Patient Active Problem List   Diagnosis Date Noted  . Cough due to ACE inhibitor 03/03/2020  . Benign prostatic hyperplasia without lower urinary tract symptoms 03/01/2020  . Diabetic gastroparesis associated with type 1 diabetes mellitus (  [place]) 03/01/2020  . Uncontrolled type 1 diabetes mellitus with hyperglycemia ([facility]) 03/01/2020  . Gait disturbance 08/19/2019  . Acquired diplegia ([place]) 08/19/2019  . Vitamin D insufficiency 11/29/2016  . Hypogonadism in male 11/01/2016  . 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[facility])   . Hypothyroidism   .  Hypertension   . Abnormal liver function tests 03/06/2013  . Type I (juvenile type) diabetes mellitus without mention of complication, uncontrolled 12/03/2007  . HYPERCHOLESTEROLEMIA 11/12/2007  . MULTIPLE SCLEROSIS 08/29/2007  . PROLIFERATIVE DIABETIC RETINOPATHY 08/29/2007    [name], PT, DPT 03/12/20    1:04 PM   Addendum: PHYSICAL THERAPY DISCHARGE SUMMARY  Visits from Start of Care: 93  Current functional level related to goals / functional outcomes: Pt unable to return to therapy after follow up appointment with neurologist due to multiple other medical issues/appointments to investigate blood clot and due to fall with rib fracture.  Pt to request new order from physician in order to return to therapy.   Remaining deficits: Impaired strength, impaired balance, abnormal gait, falls   Education / Equipment: HEP  Plan: Patient agrees to discharge.  Patient goals were not met. Patient is being discharged due to a change in medical status.  ?????     [name], PT, DPT 08/02/20    10:15 PM     [facility] [address] [place], [place], [address] Phone: [phone]   Fax:  [phone]  Name: Johnathan Ross MRN: [id number] Date of Birth: 18-Jun-1970

## 2020-03-13 NOTE — Progress Notes (Signed)
[place] [phone]   Fax:([phone] [place] NOTE  Patient Care Team: Marrian Salvage, [place] as PCP - General (Internal Medicine) Ellwood Dense., MD as Referring Physician (Neurology)  Hematological/Oncological History # Left Upper Extremity DVT 02/21/2019: [place] Extremity US showed findings consistent with acute deep vein thrombosis involving the left axillary vein and left subclavian.  CT PE study showed no evidence of PE 02/25/2019: Repeat Upper extremity US showed findings for deep vein thrombosis appear unchanged from previous study.  02/29/2020: another upper extremity US showed findings for deep vein thrombosis appear resolved from previous study 03/13/2020: establish care with Dr. Lorenso Courier   CHIEF COMPLAINTS/PURPOSE OF CONSULTATION:  "Left Upper Extremity DVT "  HISTORY OF PRESENTING ILLNESS:  Johnathan Ross 49 y.o. male with medical history significant for HLD, HTN, hypothyroidism, DM type 1, and multiple sclerosis who presents for evaluation of a LUE DVT.    On review of the previous records Johnathan Ross was initially diagnosed with a left upper extremity DVT on 02/21/2019.  He was treated with 6 months of Xarelto therapy and subsequently had a repeat scan performed on 02/29/2020 which showed the DVT had completely resolved.  Due to concern for this prior history of left upper extremity DVT the patient was referred to hematology for further evaluation and management.  On exam today Johnathan Ross is accompanied by his mother.  He reports that he has had no other history of VTE other than his left upper extremity DVT.  He notes that when he was nasally diagnosed he had recently broken his ankles and was using his left arm to pull himself up from his bed.  He would reach out and was beginning to feel discomfort in his armpit and chest.  He reports that one morning he woke up and his arm was swollen and that is what prompted him to go to the  emergency department.  While receiving blood thinners he noted he is not having any issues with bleeding bruising, or dark stools.  On further discussion he notes he has no family history remarkable for blood clots.  He reports that he is a never smoker never drinker and is not currently employed.  He reports he had a diagnosis of MS for approximately 10 years and walks with a walker.  He denies having any issues with fevers, chills, sweats, nausea, vomiting or diarrhea.  He has had no shortness of breath or chest pain. He has not had any lower extremity swelling.  A full ten point ROS is listed below.  MEDICAL HISTORY:  Past Medical History:  Diagnosis Date  . Hyperlipidemia   . Hypertension   . Hypogonadism in male 11/01/2016  . Hypothyroidism   . Multiple sclerosis ([place])   . Proliferative diabetic retinopathy(362.02)   . Type 1 diabetes mellitus ([place]) dx'd 1994  . Vitamin D insufficiency 11/29/2016    SURGICAL HISTORY: Past Surgical History:  Procedure Laterality Date  . EYE SURGERY Bilateral    "laser for diabetic retinopathy"  . FRACTURE SURGERY    . OPEN REDUCTION INTERNAL FIXATION (ORIF) TIBIA/FIBULA FRACTURE Right 10/30/2016  . ORIF ANKLE FRACTURE Left 2015  . ORIF TIBIA FRACTURE Right 10/30/2016   Procedure: OPEN REDUCTION INTERNAL FIXATION (ORIF) TIBIA FIBULA FRACTURE;  Surgeon: Altamese Cloverdale, MD;  Location: [facility];  Service: Orthopedics;  Laterality: Right;  . RETINAL LASER PROCEDURE Bilateral    "for diabetic retinopathy"    SOCIAL HISTORY: Social History   Socioeconomic History  .  Marital status: Single    Spouse name: 12  . Number of children: 1  . Years of education: Not on file  . Highest education level: Not on file  Occupational History  . Occupation: unemployed  Tobacco Use  . Smoking status: Never Smoker  . Smokeless tobacco: Never Used  Vaping Use  . Vaping Use: Never used  Substance and Sexual Activity  . Alcohol use: No  . Drug use: No  . Sexual  activity: Not on file  Other Topics Concern  . Not on file  Social History Narrative   Right handed   No caffeine    Lives with girlfriend   Social Determinants of Health   Financial Resource Strain:   . Difficulty of Paying Living Expenses: Not on file  Food Insecurity:   . Worried About Charity fundraiser in the Last Year: Not on file  . Ran Out of Food in the Last Year: Not on file  Transportation Needs:   . Lack of Transportation (Medical): Not on file  . Lack of Transportation (Non-Medical): Not on file  Physical Activity:   . Days of Exercise per Week: Not on file  . Minutes of Exercise per Session: Not on file  Stress:   . Feeling of Stress : Not on file  Social Connections:   . Frequency of Communication with Friends and Family: Not on file  . Frequency of Social Gatherings with Friends and Family: Not on file  . Attends Religious Services: Not on file  . Active Member of Clubs or Organizations: Not on file  . Attends Archivist Meetings: Not on file  . Marital Status: Not on file  Intimate Partner Violence:   . Fear of Current or Ex-Partner: Not on file  . Emotionally Abused: Not on file  . Physically Abused: Not on file  . Sexually Abused: Not on file    FAMILY HISTORY: Family History  Problem Relation Age of Onset  . Diabetes Sister   . Colon cancer Maternal Grandmother   . Colon polyps Maternal Uncle     ALLERGIES:  is allergic to lisinopril.  MEDICATIONS:  Current Outpatient Medications  Medication Sig Dispense Refill  . baclofen (LIORESAL) 10 MG tablet Take one or two pills three times a day (up to 6 pills/day) 180 each 11  . dalfampridine (AMPYRA) 10 MG TB12 Take 1 tablet (10 mg total) by mouth 2 (two) times daily. 180 tablet 3  . glucose blood (CONTOUR NEXT TEST) test strip USE AS INSTRUCTED TO CHECK BLOOD SUGARS 8 TIMES DAILY 300 strip 3  . insulin aspart (NOVOLOG) 100 UNIT/ML injection USE UP TO 80 UNITS IN INSULIN PUMP DAILY-DX CODE  E10.8 80 mL 2  . simvastatin (ZOCOR) 20 MG tablet TAKE 1 TABLET BY MOUTH EVERYDAY AT BEDTIME 90 tablet 0  . SYNTHROID 125 MCG tablet TAKE 1 TABLET BY MOUTH 6 DAYS PER WEEK 72 tablet 1   No current facility-administered medications for this visit.    REVIEW OF SYSTEMS:   Constitutional: ( - ) fevers, ( - )  chills , ( - ) night sweats Eyes: ( - ) blurriness of vision, ( - ) double vision, ( - ) watery eyes Ears, nose, mouth, throat, and face: ( - ) mucositis, ( - ) sore throat Respiratory: ( - ) cough, ( - ) dyspnea, ( - ) wheezes Cardiovascular: ( - ) palpitation, ( - ) chest discomfort, ( - ) lower extremity swelling Gastrointestinal:  ( - )  nausea, ( - ) heartburn, ( - ) change in bowel habits Skin: ( - ) abnormal skin rashes Lymphatics: ( - ) new lymphadenopathy, ( - ) easy bruising Neurological: ( - ) numbness, ( - ) tingling, ( - ) new weaknesses Behavioral/Psych: ( - ) mood change, ( - ) new changes  All other systems were reviewed with the patient and are negative.  PHYSICAL EXAMINATION:  Vitals:   03/14/20 0920  BP: (!) 144/84  Pulse: 77  Resp: 18  Temp: (!) 95.1 F (35.1 C)  SpO2: 100%   Filed Weights   03/14/20 0920  Weight: 196 lb 8 oz (89.1 kg)    GENERAL: well appearing middle aged African [place] male in NAD  SKIN: skin color, texture, turgor are normal, no rashes or significant lesions EYES: conjunctiva are pink and non-injected, sclera clear LUNGS: clear to auscultation and percussion with normal breathing effort HEART: regular rate & rhythm and no murmurs and no lower extremity edema Musculoskeletal: no cyanosis of digits and no clubbing  PSYCH: alert & oriented x 3, fluent speech NEURO: no focal motor/sensory deficits  LABORATORY DATA:  I have reviewed the data as listed CBC Latest Ref Rng & Units 07/28/2019 02/21/2019 11/03/2016  WBC - 7.3 16.9(H) 14.4(H)  Hemoglobin 13.5 - 17.5 15.6 15.4 12.2(L)  Hematocrit 41 - 53 48 47.3 37.0(L)  Platelets 150 - 400  K/uL - 278 329    CMP Latest Ref Rng & Units 12/03/2019 08/11/2019 07/28/2019  Glucose 70 - 99 mg/dL 241(H) 202(H) -  BUN 6 - 23 mg/dL 20 20 -  Creatinine 0.40 - 1.50 mg/dL 1.20 1.11 1.2  Sodium 135 - 145 mEq/L 134(L) 136 -  Potassium 3.5 - 5.1 mEq/L 4.6 4.6 -  Chloride 96 - 112 mEq/L 98 101 -  CO2 19 - 32 mEq/L 28 29 -  Calcium 8.4 - 10.5 mg/dL 9.4 9.6 -  Total Protein 6.0 - 8.3 g/dL - 7.0 -  Total Bilirubin 0.2 - 1.2 mg/dL - 0.5 -  Alkaline Phos 39 - 117 U/L - 81 -  AST 0 - 37 U/L - 24 24  ALT 0 - 53 U/L - 23 24   RADIOGRAPHIC STUDIES: VAS [place] UPPER EXTREMITY VENOUS DUPLEX  Result Date: 03/01/2020 UPPER VENOUS STUDY  Indications: Patient reports feeling a knot under his left arm in September 2020 that prompted him to get an ultrasound of the left upper extremity. Today, he is here for a follow-up from the exams performed in 01/2019. He denies any SOB. Risk Factors: Trauma to both ankles x 1 year per patient, Multiple Sclerosis and Diabetes. Comparison Study: On 02/21/2019 and 02/25/2019, a venous duplex of the left                   upper extremity was performed that showed an acute deep venous                   thrombosis in the left subclavian and axillary veins. Performing Technologist: Sharlett Iles RVT  Examination Guidelines: A complete evaluation includes B-mode imaging, spectral Doppler, color Doppler, and power Doppler as needed of all accessible portions of each vessel. Bilateral testing is considered an integral part of a complete examination. Limited examinations for reoccurring indications may be performed as noted.  Right Findings: +----------+------------+---------+-----------+----------+--------------------+ RIGHT     CompressiblePhasicitySpontaneousProperties      Summary        +----------+------------+---------+-----------+----------+--------------------+ Subclavian  Yes       Yes                 Difficulty to                                                               visualize       +----------+------------+---------+-----------+----------+--------------------+  Left Findings: +----------+------------+---------+-----------+----------+-------+ LEFT      CompressiblePhasicitySpontaneousPropertiesSummary +----------+------------+---------+-----------+----------+-------+ IJV                      Yes       Yes                      +----------+------------+---------+-----------+----------+-------+ Subclavian    Full       Yes       Yes                      +----------+------------+---------+-----------+----------+-------+ Axillary      Full       Yes       Yes                      +----------+------------+---------+-----------+----------+-------+ Brachial      Full       Yes       Yes                      +----------+------------+---------+-----------+----------+-------+ Radial        Full       Yes       Yes                      +----------+------------+---------+-----------+----------+-------+ Ulnar         Full       Yes       Yes                      +----------+------------+---------+-----------+----------+-------+ Cephalic      Full       Yes       Yes                      +----------+------------+---------+-----------+----------+-------+ Basilic       Full       Yes       Yes                      +----------+------------+---------+-----------+----------+-------+  Summary:  Right: No evidence of thrombosis in the subclavian.  Left: No evidence of deep vein thrombosis in the upper extremity. No evidence of superficial vein thrombosis in the upper extremity. Findings for deep vein thrombosis appear resolved from previous study.  *See table(s) above for measurements and observations.  Diagnosing physician: Quay Burow MD Electronically signed by Quay Burow MD on 03/01/2020 at 12:53:08 PM.    Final     ASSESSMENT & PLAN Johnathan Ross 49 y.o. male with medical history significant for  HLD, HTN, hypothyroidism, DM type 1, and multiple sclerosis who presents for evaluation of a LUE DVT.  After review the labs, review the records, discussion with the patient the findings most consistent with a left upper extremity DVT of unclear etiology.  The  DVT was most likely caused by a thoracic outlet syndrome and therefore we will have the patient evaluated by IR venogram in order to look for obstruction.  This is an unusual location for DVT, though the patient does have history of MS and uses a walker.  The patient had resolution of his DVT after 6 months of Xarelto therapy.  He has been off of his therapy for over 6 months.  The patient has had no other signs or symptoms of recurrent DVT or PE.  There is no need for routine follow-up in our clinic at this time.  # Left Upper Extremity DVT --Patient was diagnosed over 1 year ago and subsequently completed his Xarelto therapy x6 months. --Recent upper extremity ultrasound showed no evidence of residual DVT --In order to effectively rule out thoracic outlet syndrome I would recommend IR venogram of his left upper extremity.  This was discussed with radiology and the order has been placed. --No need for routine follow-up in our clinic.  If thoracic outlet obstruction is noted did then stenting of the obstruction would be appropriate. --No need for further routine follow-up in hematology clinic, though we are happy to see the patient back in the event he were to develop any new or worsening symptoms.  Orders Placed This Encounter  Procedures  . IR Veno/Ext/Uni Left    Standing Status:   Future    Standing Expiration Date:   03/14/2021    Order Specific Question:   Reason for Exam (SYMPTOM  OR DIAGNOSIS REQUIRED)    Answer:   Left upper extremity DVT, concern for outlet obstruction    Order Specific Question:   Preferred Imaging Location?    Answer:   [facility]    Order Specific Question:   Call Results- Best Contact Number?     Answer:   [phone]    All questions were answered. The patient knows to call the clinic with any problems, questions or concerns.  A total of more than 60 minutes were spent on this encounter and over half of that time was spent on counseling and coordination of care as outlined above.   Ledell Peoples, MD Department of Hematology/Oncology [place] [facility] Phone: [phone] Pager: [phone] Email: [email or web address]  03/14/2020 10:35 AM

## 2020-03-14 ENCOUNTER — Inpatient Hospital Stay: Payer: Medicare Other | Attending: Hematology and Oncology | Admitting: Hematology and Oncology

## 2020-03-14 ENCOUNTER — Other Ambulatory Visit (INDEPENDENT_AMBULATORY_CARE_PROVIDER_SITE_OTHER): Payer: Medicare Other

## 2020-03-14 ENCOUNTER — Other Ambulatory Visit: Payer: Self-pay

## 2020-03-14 ENCOUNTER — Inpatient Hospital Stay: Payer: Medicare Other

## 2020-03-14 VITALS — BP 144/84 | HR 77 | Temp 95.1°F | Resp 18 | Ht 73.0 in | Wt 196.5 lb

## 2020-03-14 DIAGNOSIS — E109 Type 1 diabetes mellitus without complications: Secondary | ICD-10-CM | POA: Diagnosis not present

## 2020-03-14 DIAGNOSIS — I82A12 Acute embolism and thrombosis of left axillary vein: Secondary | ICD-10-CM

## 2020-03-14 DIAGNOSIS — E785 Hyperlipidemia, unspecified: Secondary | ICD-10-CM | POA: Insufficient documentation

## 2020-03-14 DIAGNOSIS — Z86718 Personal history of other venous thrombosis and embolism: Secondary | ICD-10-CM | POA: Diagnosis not present

## 2020-03-14 DIAGNOSIS — E782 Mixed hyperlipidemia: Secondary | ICD-10-CM

## 2020-03-14 DIAGNOSIS — E063 Autoimmune thyroiditis: Secondary | ICD-10-CM

## 2020-03-14 DIAGNOSIS — Z794 Long term (current) use of insulin: Secondary | ICD-10-CM | POA: Insufficient documentation

## 2020-03-14 DIAGNOSIS — G35 Multiple sclerosis: Secondary | ICD-10-CM | POA: Insufficient documentation

## 2020-03-14 DIAGNOSIS — E1065 Type 1 diabetes mellitus with hyperglycemia: Secondary | ICD-10-CM

## 2020-03-14 DIAGNOSIS — E039 Hypothyroidism, unspecified: Secondary | ICD-10-CM | POA: Insufficient documentation

## 2020-03-14 DIAGNOSIS — I10 Essential (primary) hypertension: Secondary | ICD-10-CM | POA: Diagnosis not present

## 2020-03-14 LAB — LIPID PANEL
Cholesterol: 136 mg/dL (ref 0–200)
HDL: 46.7 mg/dL (ref >39.00)
LDL Cholesterol: 79 mg/dL (ref 0–99)
NonHDL: 89.55
Total CHOL/HDL Ratio: 3
Triglycerides: 52 mg/dL (ref 0.0–149.0)
VLDL: 10.4 mg/dL (ref 0.0–40.0)

## 2020-03-14 LAB — T4, FREE: Free T4: 0.75 ng/dL (ref 0.60–1.60)

## 2020-03-14 LAB — COMPREHENSIVE METABOLIC PANEL
ALT: 31 U/L (ref 0–53)
AST: 28 U/L (ref 0–37)
Albumin: 4.3 g/dL (ref 3.5–5.2)
Alkaline Phosphatase: 81 U/L (ref 39–117)
BUN: 14 mg/dL (ref 6–23)
CO2: 32 mEq/L (ref 19–32)
Calcium: 9.6 mg/dL (ref 8.4–10.5)
Chloride: 100 mEq/L (ref 96–112)
Creatinine, Ser: 1.1 mg/dL (ref 0.40–1.50)
GFR: 79.18 mL/min (ref >60.00)
Glucose, Bld: 139 mg/dL — ABNORMAL HIGH (ref 70–99)
Potassium: 4.2 mEq/L (ref 3.5–5.1)
Sodium: 138 mEq/L (ref 135–145)
Total Bilirubin: 0.6 mg/dL (ref 0.2–1.2)
Total Protein: 7.4 g/dL (ref 6.0–8.3)

## 2020-03-14 LAB — HEMOGLOBIN A1C: Hgb A1c MFr Bld: 8.2 % — ABNORMAL HIGH (ref 4.6–6.5)

## 2020-03-14 LAB — TSH: TSH: 6.17 u[IU]/mL — ABNORMAL HIGH (ref 0.35–4.50)

## 2020-03-16 ENCOUNTER — Ambulatory Visit: Payer: Medicare Other | Admitting: Internal Medicine

## 2020-03-21 ENCOUNTER — Other Ambulatory Visit: Payer: Self-pay

## 2020-03-21 ENCOUNTER — Telehealth: Payer: Medicare Other | Admitting: Endocrinology

## 2020-03-23 ENCOUNTER — Ambulatory Visit (INDEPENDENT_AMBULATORY_CARE_PROVIDER_SITE_OTHER): Payer: Medicare Other | Admitting: Neurology

## 2020-03-23 ENCOUNTER — Encounter: Payer: Self-pay | Admitting: Neurology

## 2020-03-23 ENCOUNTER — Other Ambulatory Visit: Payer: Self-pay

## 2020-03-23 ENCOUNTER — Other Ambulatory Visit: Payer: Self-pay | Admitting: Student

## 2020-03-23 VITALS — BP 142/80 | HR 89 | Ht 73.0 in | Wt 201.5 lb

## 2020-03-23 DIAGNOSIS — G35 Multiple sclerosis: Secondary | ICD-10-CM | POA: Diagnosis not present

## 2020-03-23 DIAGNOSIS — E559 Vitamin D deficiency, unspecified: Secondary | ICD-10-CM | POA: Diagnosis not present

## 2020-03-23 DIAGNOSIS — R269 Unspecified abnormalities of gait and mobility: Secondary | ICD-10-CM | POA: Insufficient documentation

## 2020-03-23 DIAGNOSIS — E1042 Type 1 diabetes mellitus with diabetic polyneuropathy: Secondary | ICD-10-CM | POA: Diagnosis not present

## 2020-03-23 DIAGNOSIS — G830 Diplegia of upper limbs: Secondary | ICD-10-CM | POA: Diagnosis not present

## 2020-03-23 NOTE — Progress Notes (Signed)
[facility]  PATIENT: Johnathan Ross DOB: Aug 25, 1970  REFERRING DOCTOR OR PCP: Jodi Mourning, FNP SOURCE: Patient, notes from Dr. Bjorn Loser (neurology), multiple MRI reports and images reviewed, lab reports.  _________________________________   HISTORICAL  CHIEF COMPLAINT:  Chief Complaint  Patient presents with  . Follow-up    RM 13. Last seen 12/23/19. Physical therapist mentioned he seemed weaker and suggested he see Dr. Felecia Shelling.   . Multiple Sclerosis    On [place].   . Gait Problem    Ambulates with walker     HISTORY OF PRESENT ILLNESS:  Johnathan Ross is a 49 year old man who was diagnosed with relapsing remitting MS around 2007.     Update 03/23/2020: He had his 2 years of Lem[place] therapy in 2016 and 2017.  He tolerated well.  He denies any exacerbation or new neurologic symptoms,.  However, his spasticity is worse.   His physical therapist feels he is weaker    He also had his Covid-19 vaccination September.October [facility]) and felt worse x 5 days.     He has had difficulties with weakness in his legs, spasticity and gait disturbance.   He takes baclofen 10 mg three pills a day - he feels th same on 2 or 3 pills.   He is sleepy in the early evening.Marland Kitchen He also takes dalfampridine 10 mg po bid.   He thinks it has helped.  He uses a walker for moderate distances.  Right leg is mildly worse. . Both legs spasm up.  He had a single series of Botox injections in November 2020 but felt weaker without any benefit of his spasticity.    Spasticity is worse when he sits a while and then has to stretch after he stands up.    He uses the walker to walk mostly by lifting with his arms.      He notes mild numbness in his feet.  There is no dysesthetic pain in the feet.  He also has Type 1 DM in 1995 and has been on insulin.  He notes some urinary urgency.  He denies difficulties with his vision.  He reports some fatigue.  He sleeps well some nights but other nights wakes  up.  He denies much difficulties with cognition.  He denies mood disturbance.  On the insulin pump his HgBA1c is usually 7's.  Before it was mostly 11-13.     MS history:  In 2007, he presented with spasms in his left leg.   He saw Dr. Bjorn Loser in [place] and was started Rebif and had some progression and also had difficulty tolerating it.    He went on [place] in 2016 and had his second year in 2017.        Imaging studies: MRIs of the brain and cervical spine from 07/27/2019:  The MRI of the spine shows several T2 hyperintense foci, located posterior laterally bilaterally, right larger than left adjacent to C2-C3, laterally to the left at C3-C4, centrally at C4-C5 also associated with spinal stenosis.  The MRI of the brain shows multiple T2/FLAIR hyperintense foci in the periventricular and juxtacortical white matter and a couple punctate foci in the cerebellum.  None of the foci appear to be acute.  Other medical history: He has type 1 diabetes mellitus and is on insulin (pump).  He also has hypothyroidism and is on Synthroid.  He has well-controlled hypertension.  REVIEW OF SYSTEMS: Constitutional: No fevers, chills, sweats, or change in appetite.  He notes some  fatigue. Eyes: No visual changes, double vision, eye pain.  He has had laser eye surgery Ear, nose and throat: No hearing loss, ear pain, nasal congestion, sore throat Cardiovascular: No chest pain, palpitations Respiratory: No shortness of breath at rest or with exertion.   No wheezes GastrointestinaI: No nausea, vomiting, diarrhea, abdominal pain, fecal incontinence Genitourinary: No dysuria, urinary retention or frequency.  No nocturia. Musculoskeletal: No neck pain, back pain Integumentary: No rash, pruritus, skin lesions Neurological: as above Psychiatric: No depression at this time.  No anxiety Endocrine: No palpitations, diaphoresis, change in appetite, change in weigh or increased thirst Hematologic/Lymphatic: No  anemia, purpura, petechiae. Allergic/Immunologic: No itchy/runny eyes, nasal congestion, recent allergic reactions, rashes  ALLERGIES: Allergies  Allergen Reactions  . Lisinopril Cough    HOME MEDICATIONS:  Current Outpatient Medications:  .  baclofen (LIORESAL) 10 MG tablet, Take one or two pills three times a day (up to 6 pills/day), Disp: 180 each, Rfl: 11 .  dalfampridine (AMPYRA) 10 MG TB12, Take 1 tablet (10 mg total) by mouth 2 (two) times daily., Disp: 180 tablet, Rfl: 3 .  glucose blood (CONTOUR NEXT TEST) test strip, USE AS INSTRUCTED TO CHECK BLOOD SUGARS 8 TIMES DAILY, Disp: 300 strip, Rfl: 3 .  insulin aspart (NOVOLOG) 100 UNIT/ML injection, USE UP TO 80 UNITS IN INSULIN PUMP DAILY-DX CODE E10.8, Disp: 80 mL, Rfl: 2 .  simvastatin (ZOCOR) 20 MG tablet, TAKE 1 TABLET BY MOUTH EVERYDAY AT BEDTIME, Disp: 90 tablet, Rfl: 0 .  SYNTHROID 125 MCG tablet, TAKE 1 TABLET BY MOUTH 6 DAYS PER WEEK, Disp: 72 tablet, Rfl: 1  PAST MEDICAL HISTORY: Past Medical History:  Diagnosis Date  . Hyperlipidemia   . Hypertension   . Hypogonadism in male 11/01/2016  . Hypothyroidism   . Multiple sclerosis ([place])   . Proliferative diabetic retinopathy(362.02)   . Type 1 diabetes mellitus ([place]) dx'd 1994  . Vitamin D insufficiency 11/29/2016    PAST SURGICAL HISTORY: Past Surgical History:  Procedure Laterality Date  . EYE SURGERY Bilateral    "laser for diabetic retinopathy"  . FRACTURE SURGERY    . OPEN REDUCTION INTERNAL FIXATION (ORIF) TIBIA/FIBULA FRACTURE Right 10/30/2016  . ORIF ANKLE FRACTURE Left 2015  . ORIF TIBIA FRACTURE Right 10/30/2016   Procedure: OPEN REDUCTION INTERNAL FIXATION (ORIF) TIBIA FIBULA FRACTURE;  Surgeon: Altamese [place], MD;  Location: [facility];  Service: Orthopedics;  Laterality: Right;  . RETINAL LASER PROCEDURE Bilateral    "for diabetic retinopathy"    FAMILY HISTORY: Family History  Problem Relation Age of Onset  . Diabetes Sister   . Colon cancer Maternal  Grandmother   . Colon polyps Maternal Uncle     SOCIAL HISTORY:  Social History   Socioeconomic History  . Marital status: Single    Spouse name: 12  . Number of children: 1  . Years of education: Not on file  . Highest education level: Not on file  Occupational History  . Occupation: unemployed  Tobacco Use  . Smoking status: Never Smoker  . Smokeless tobacco: Never Used  Vaping Use  . Vaping Use: Never used  Substance and Sexual Activity  . Alcohol use: No  . Drug use: No  . Sexual activity: Not on file  Other Topics Concern  . Not on file  Social History Narrative   Right handed   No caffeine    Lives with girlfriend   Social Determinants of Health   Financial Resource Strain:   . Difficulty of  Paying Living Expenses: Not on file  Food Insecurity:   . Worried About Charity fundraiser in the Last Year: Not on file  . Ran Out of Food in the Last Year: Not on file  Transportation Needs:   . Lack of Transportation (Medical): Not on file  . Lack of Transportation (Non-Medical): Not on file  Physical Activity:   . Days of Exercise per Week: Not on file  . Minutes of Exercise per Session: Not on file  Stress:   . Feeling of Stress : Not on file  Social Connections:   . Frequency of Communication with Friends and Family: Not on file  . Frequency of Social Gatherings with Friends and Family: Not on file  . Attends Religious Services: Not on file  . Active Member of Clubs or Organizations: Not on file  . Attends Archivist Meetings: Not on file  . Marital Status: Not on file  Intimate Partner Violence:   . Fear of Current or Ex-Partner: Not on file  . Emotionally Abused: Not on file  . Physically Abused: Not on file  . Sexually Abused: Not on file     PHYSICAL EXAM  Vitals:   03/23/20 0840  BP: (!) 142/80  Pulse: 89  SpO2: 99%  Weight: 201 lb 8 oz (91.4 kg)  Height: 6\' 1"  (1.854 m)    Body mass index is 26.58 kg/m.  No exam data  present  General: The patient is well-developed and well-nourished and in no acute distress   Skin: Extremities are without rash or  edema.  Neurologic Exam  Mental status: The patient is alert and oriented x 3 at the time of the examination. The patient has apparent normal recent and remote memory, with an apparently normal attention span and concentration ability.   Speech is normal.  Cranial nerves: Extraocular movements are full   Color vision was symmetric.  Facial symmetry is present.  Facial strength was normal.  Trapezius strength was normal no obvious hearing deficits are noted.  Motor:  Muscle bulk is normal.   Muscle tone is increased in the legs.  Strength is 5/5 in the arms, 3 to 4 - in the hip flexors, 4+ in the leg extenders, 4 in the gastrocnemius muscles and 3 to 4 - with ankle extensors and toe extensors  Sensory: Sensory testing is intact to pinprick, soft touch and vibration sensation in the arms but he has reduced vibration sensation and reduced touch sensation in the feet vibration sensation was just mildly reduced at the ankle.  Coordination: Cerebellar testing reveals good finger-nose-finger and unable to do  heel-to-shin bilaterally.  Gait and station: He can rise up from a chair using his arms to support.  He uses a walker but relies on his arm strength, esp with turns.  Reflexes: Deep tendon reflexes are normal in the arms and knees and absent at the toes.  Extensor Babinski.     DIAGNOSTIC DATA (LABS, IMAGING, TESTING) - I reviewed patient records, labs, notes, testing and imaging myself where available.  Lab Results  Component Value Date   WBC 7.3 07/28/2019   HGB 15.6 07/28/2019   HCT 48 07/28/2019   MCV 86.8 02/21/2019   PLT 278 02/21/2019      Component Value Date/Time   NA 138 03/14/2020 1047   K 4.2 03/14/2020 1047   CL 100 03/14/2020 1047   CO2 32 03/14/2020 1047   GLUCOSE 139 (H) 03/14/2020 1047   GLUCOSE 147 (H)  05/24/2006 1022   BUN 14  03/14/2020 1047   CREATININE 1.10 03/14/2020 1047   CALCIUM 9.6 03/14/2020 1047   CALCIUM 8.7 10/31/2016 0415   PROT 7.4 03/14/2020 1047   ALBUMIN 4.3 03/14/2020 1047   AST 28 03/14/2020 1047   ALT 31 03/14/2020 1047   ALKPHOS 81 03/14/2020 1047   BILITOT 0.6 03/14/2020 1047   GFRNONAA 75 07/28/2019 0000   GFRAA 87 07/28/2019 0000   Lab Results  Component Value Date   CHOL 136 03/14/2020   HDL 46.70 03/14/2020   LDLCALC 79 03/14/2020   LDLDIRECT 139.7 04/09/2014   TRIG 52.0 03/14/2020   CHOLHDL 3 03/14/2020   Lab Results  Component Value Date   HGBA1C 8.2 (H) 03/14/2020   No results found for: VITAMINB12 Lab Results  Component Value Date   TSH 6.17 (H) 03/14/2020       ASSESSMENT AND PLAN  Multiple sclerosis ([place]) - Plan: CBC with Differential/Platelet  Gait disorder  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HCC)  Acquired diplegia (HCC)  Vitamin D insufficiency - Plan: VITAMIN D 25 Hydroxy (Vit-D Deficiency, Fractures)  Gait disturbance  1.  His MS appears to be stable since doing 2 years of [place] in 2016 and 2017.  His last MRI (07/27/2019) showed no new lesions.  I believe his mild worsening this year is mostly due to secondary progressive MS and unfortunately, no medications slow it down convincingly.  The diabetic polyneuropathy is not playing a major role.   2.   Continue baclofen.  He can take up to 5-6pills a day to see if the higher dose has more benefit.  He should reduce the dose if he feels the legs give out more 3.   Stay active and walk as tolerated with walker 4.   He will return to see [place] in 6 months or sooner for new or worsening neurologic symptoms.  Orbie Grupe A. Felecia Shelling, MD, [facility] 86/76/7209, 4:70 AM Certified in Neurology, Clinical Neurophysiology, Sleep Medicine and Neuroimaging  [facility] [address] [place], [place] [address] [phone]

## 2020-03-24 ENCOUNTER — Ambulatory Visit (HOSPITAL_COMMUNITY)
Admission: RE | Admit: 2020-03-24 | Discharge: 2020-03-24 | Disposition: A | Discharge status: Home / Self Care | Payer: Medicare Other | Source: Ambulatory Visit | Attending: Hematology and Oncology | Admitting: Hematology and Oncology

## 2020-03-24 ENCOUNTER — Telehealth: Payer: Self-pay | Admitting: *Deleted

## 2020-03-24 ENCOUNTER — Telehealth: Payer: Self-pay | Admitting: Internal Medicine

## 2020-03-24 ENCOUNTER — Other Ambulatory Visit: Payer: Self-pay

## 2020-03-24 ENCOUNTER — Telehealth: Payer: Self-pay | Admitting: Endocrinology

## 2020-03-24 DIAGNOSIS — I82B12 Acute embolism and thrombosis of left subclavian vein: Secondary | ICD-10-CM | POA: Diagnosis not present

## 2020-03-24 DIAGNOSIS — H18832 Recurrent erosion of cornea, left eye: Secondary | ICD-10-CM | POA: Insufficient documentation

## 2020-03-24 DIAGNOSIS — H35371 Puckering of macula, right eye: Secondary | ICD-10-CM | POA: Insufficient documentation

## 2020-03-24 DIAGNOSIS — H31009 Unspecified chorioretinal scars, unspecified eye: Secondary | ICD-10-CM | POA: Insufficient documentation

## 2020-03-24 DIAGNOSIS — H359 Unspecified retinal disorder: Secondary | ICD-10-CM | POA: Insufficient documentation

## 2020-03-24 DIAGNOSIS — E103553 Type 1 diabetes mellitus with stable proliferative diabetic retinopathy, bilateral: Secondary | ICD-10-CM | POA: Insufficient documentation

## 2020-03-24 DIAGNOSIS — I82A12 Acute embolism and thrombosis of left axillary vein: Secondary | ICD-10-CM | POA: Insufficient documentation

## 2020-03-24 DIAGNOSIS — H2513 Age-related nuclear cataract, bilateral: Secondary | ICD-10-CM | POA: Insufficient documentation

## 2020-03-24 HISTORY — PX: IR VENO/EXT/UNI LEFT: IMG675

## 2020-03-24 LAB — CBC WITH DIFFERENTIAL/PLATELET
Basophils Absolute: 0 10*3/uL (ref 0.0–0.2)
Basos: 0 %
EOS (ABSOLUTE): 0.5 10*3/uL — ABNORMAL HIGH (ref 0.0–0.4)
Eos: 5 %
Hematocrit: 48.1 % (ref 37.5–51.0)
Hemoglobin: 15.5 g/dL (ref 13.0–17.7)
Immature Grans (Abs): 0 10*3/uL (ref 0.0–0.1)
Immature Granulocytes: 0 %
Lymphocytes Absolute: 1.3 10*3/uL (ref 0.7–3.1)
Lymphs: 14 %
MCH: 28.9 pg (ref 26.6–33.0)
MCHC: 32.2 g/dL (ref 31.5–35.7)
MCV: 90 fL (ref 79–97)
Monocytes Absolute: 0.7 10*3/uL (ref 0.1–0.9)
Monocytes: 8 %
Neutrophils Absolute: 6.7 10*3/uL (ref 1.4–7.0)
Neutrophils: 73 %
Platelets: 281 10*3/uL (ref 150–450)
RBC: 5.36 x10E6/uL (ref 4.14–5.80)
RDW: 12.8 % (ref 11.6–15.4)
WBC: 9.1 10*3/uL (ref 3.4–10.8)

## 2020-03-24 LAB — VITAMIN D 25 HYDROXY (VIT D DEFICIENCY, FRACTURES): Vit D, 25-Hydroxy: 34.4 ng/mL (ref 30.0–100.0)

## 2020-03-24 MED ORDER — IOHEXOL 300 MG/ML  SOLN
50.0000 mL | Freq: Once | INTRAMUSCULAR | Status: AC | PRN
  Administered 2020-03-24: 20 mL via INTRAVENOUS

## 2020-03-24 MED ORDER — LIDOCAINE HCL 1 % IJ SOLN
INTRAMUSCULAR | Status: AC
  Administered 2020-03-24: 1 mL
  Filled 2020-03-24: qty 20

## 2020-03-24 NOTE — Telephone Encounter (Signed)
    Patient calling with questions about a possible referral to Cardiology. Patient states he had procedure today (IR veno) he is concerned about getting the results. Advised patient to contact Dr Lorenso Courier for results. Patient also has some concerns about when he last appointment was with Dr Ronnald Ramp. I explained to patient several times his last appointment shows in Epic to be 03/01/20 patient is adamant he did not have that appointment.

## 2020-03-24 NOTE — Telephone Encounter (Signed)
Patient requests to be called at ph# [phone]. FYI, Patient has the [employer] Pump and [employer].

## 2020-03-24 NOTE — Telephone Encounter (Signed)
Received call from patient. He states he has had the IR venogram completed today. He is asking what his next steps are. Results of venogram were not available at the time of this call.  Please advise

## 2020-03-25 ENCOUNTER — Telehealth: Payer: Self-pay | Admitting: Hematology and Oncology

## 2020-03-25 ENCOUNTER — Telehealth: Payer: Self-pay | Admitting: Endocrinology

## 2020-03-25 NOTE — Telephone Encounter (Signed)
He will not need labs again but need to make sure his blood sugars on the freestyle Elenor [place] are checked consistently daily 5 times a day

## 2020-03-25 NOTE — Telephone Encounter (Signed)
I called and spoke to Kelli and his sister [name]. I informed them that Herre[facility] had confirmed that his next shipment would go out on 11/23 with the supplies for his insulin pump.   I also confirmed with the Suanne Marker, CMA that all of the patients readings were in the system. The patient had labs a few days before his cancelled appointment and he wanted to know if he needed labs again and if he could have his 11/09 visit switched to a virtual appointment and that he does not need to repeat his labs.   They both expressed appreciation in the return call and information. Please call patient when it is decided if he needs labs and if will be ok to switch to virtaul on 04/05/20.

## 2020-03-25 NOTE — Telephone Encounter (Signed)
Patient's sister Johnathan Ross called in to express concerns with the office and her brother's care. She states that patient has been coming to the office for the last 10  Years and she is concerned over the chaos that he has experienced over the last year.  Her concerns are:  She states that patient's appointment on Monday was cancelled and rescheduled 10 minutes before his appointment on10/25 due to reason of only 40% of readings downloading. She states that it is extremely difficult for patient to reschedule that quickly. She stated that he also really needed virtual appointments when available and did not understand why he was told that was not an option when rescheduling to 04/05/20. She also expressed concern that he was later told that the office has all of the readings from his machine. She wants to confirm that we do have all of the readings.  She stated that this is not the first time that there were upload issues and wanted to find out if his readings could be printed out at home and sent in.  She also wanted to know what is the earliest time that he can have labwork. She stated that he has been told -2-3 days is the earliest and that he was told recently that he could have them 10 days early.   The last issue that she expressed was that he was waiting on medical supplies. She said that [facility] needed verification from our office before they send them out. Has this been received? I let the patients sister Johnathan Ross know that I would find out the answers to her concerns and return a call to [phone].

## 2020-03-25 NOTE — Telephone Encounter (Signed)
Mr. Johnathan Ross to discuss the results of his IR venogram.  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  At this time I would recommend surgical evaluation and I have already reached out to vascular surgery to see if there is any interventions they could recommend or offer at this time.  In the event that surgical intervention is not required we could pursue a more conservative approach to future VTE prevention.  Patient knows to call us with any new or recurrent symptoms and any questions or concerns.  Ledell Peoples, MD Department of Hematology/Oncology [place] at [facility] Phone: [phone] Pager: [phone] Email: [email or web address]

## 2020-03-25 NOTE — Telephone Encounter (Signed)
Please see below and advise.

## 2020-03-28 ENCOUNTER — Encounter (INDEPENDENT_AMBULATORY_CARE_PROVIDER_SITE_OTHER): Payer: Self-pay | Admitting: Ophthalmology

## 2020-03-28 ENCOUNTER — Ambulatory Visit (INDEPENDENT_AMBULATORY_CARE_PROVIDER_SITE_OTHER): Payer: Medicare Other | Admitting: Ophthalmology

## 2020-03-28 ENCOUNTER — Other Ambulatory Visit: Payer: Self-pay

## 2020-03-28 DIAGNOSIS — H359 Unspecified retinal disorder: Secondary | ICD-10-CM | POA: Diagnosis not present

## 2020-03-28 DIAGNOSIS — H59813 Chorioretinal scars after surgery for detachment, bilateral: Secondary | ICD-10-CM | POA: Diagnosis not present

## 2020-03-28 DIAGNOSIS — E103553 Type 1 diabetes mellitus with stable proliferative diabetic retinopathy, bilateral: Secondary | ICD-10-CM

## 2020-03-28 DIAGNOSIS — H2513 Age-related nuclear cataract, bilateral: Secondary | ICD-10-CM | POA: Diagnosis not present

## 2020-03-28 DIAGNOSIS — H18832 Recurrent erosion of cornea, left eye: Secondary | ICD-10-CM | POA: Diagnosis not present

## 2020-03-28 DIAGNOSIS — H31009 Unspecified chorioretinal scars, unspecified eye: Secondary | ICD-10-CM

## 2020-03-28 DIAGNOSIS — H35371 Puckering of macula, right eye: Secondary | ICD-10-CM

## 2020-03-28 DIAGNOSIS — T1511XA Foreign body in conjunctival sac, right eye, initial encounter: Secondary | ICD-10-CM | POA: Insufficient documentation

## 2020-03-28 LAB — HM DIABETES EYE EXAM

## 2020-03-28 NOTE — Telephone Encounter (Signed)
Left vm for patient to call back.

## 2020-03-28 NOTE — Telephone Encounter (Signed)
Noted, patient is aware and said he checks his blood sugar more than 5 times a day on the [place]. He will bring his pump and libre up to be downloaded before his visit.

## 2020-03-28 NOTE — Assessment & Plan Note (Signed)

## 2020-03-28 NOTE — Progress Notes (Signed)
03/28/2020     CHIEF COMPLAINT Patient presents for Retina Follow Up   HISTORY OF PRESENT ILLNESS: Johnathan Ross is a 49 y.o. male who presents to the clinic today for:   HPI    Retina Follow Up    Patient presents with  Diabetic Retinopathy.  In both eyes.  Severity is moderate.  Duration of 9 months.  Since onset it is stable.  I, the attending physician,  performed the HPI with the patient and updated documentation appropriately.          Comments    9 Month Diabetic Exam OU. FP  Pt c/o OD becoming irritated. Pt states it feels like his upper lid is causing the irritation. Pt states OD will also become blurry and there are no sharp lines when this happens. Pt states this happens off and on. Pt states he needs referral to see general ophthalmologist.  BGL: 163 A1C: 8.1       Last edited by Tilda Franco on 03/28/2020  9:23 AM. (History)      Referring physician: Marrian Salvage, [address],  [place] [id number]  HISTORICAL INFORMATION:   Selected notes from the MEDICAL RECORD NUMBER    Lab Results  Component Value Date   HGBA1C 8.2 (H) 03/14/2020     CURRENT MEDICATIONS: No current outpatient medications on file. (Ophthalmic Drugs)   No current facility-administered medications for this visit. (Ophthalmic Drugs)   Current Outpatient Medications (Other)  Medication Sig  . baclofen (LIORESAL) 10 MG tablet Take one or two pills three times a day (up to 6 pills/day)  . dalfampridine (AMPYRA) 10 MG TB12 Take 1 tablet (10 mg total) by mouth 2 (two) times daily.  [place] Kitchen glucose blood (CONTOUR NEXT TEST) test strip USE AS INSTRUCTED TO CHECK BLOOD SUGARS 8 TIMES DAILY  . insulin aspart (NOVOLOG) 100 UNIT/ML injection USE UP TO 80 UNITS IN INSULIN PUMP DAILY-DX CODE E10.8  . simvastatin (ZOCOR) 20 MG tablet TAKE 1 TABLET BY MOUTH EVERYDAY AT BEDTIME  . SYNTHROID 125 MCG tablet TAKE 1 TABLET BY MOUTH 6 DAYS PER WEEK   No current  facility-administered medications for this visit. (Other)      REVIEW OF SYSTEMS: ROS    Positive for: Endocrine   Last edited by Tilda Franco on 03/28/2020  9:20 AM. (History)       ALLERGIES Allergies  Allergen Reactions  . Lisinopril Cough    PAST MEDICAL HISTORY Past Medical History:  Diagnosis Date  . Hyperlipidemia   . Hypertension   . Hypogonadism in male 11/01/2016  . Hypothyroidism   . Multiple sclerosis ([place])   . Proliferative diabetic retinopathy(362.02)   . Type 1 diabetes mellitus ([place]) dx'd 1994  . Vitamin D insufficiency 11/29/2016   Past Surgical History:  Procedure Laterality Date  . EYE SURGERY Bilateral    "laser for diabetic retinopathy"  . FRACTURE SURGERY    . IR VENO/EXT/UNI LEFT  03/24/2020  . OPEN REDUCTION INTERNAL FIXATION (ORIF) TIBIA/FIBULA FRACTURE Right 10/30/2016  . ORIF ANKLE FRACTURE Left 2015  . ORIF TIBIA FRACTURE Right 10/30/2016   Procedure: OPEN REDUCTION INTERNAL FIXATION (ORIF) TIBIA FIBULA FRACTURE;  Surgeon: Altamese Hanover, MD;  Location: [facility];  Service: Orthopedics;  Laterality: Right;  . RETINAL LASER PROCEDURE Bilateral    "for diabetic retinopathy"    FAMILY HISTORY Family History  Problem Relation Age of Onset  . Diabetes Sister   . Colon cancer Maternal  Grandmother   . Colon polyps Maternal Uncle     SOCIAL HISTORY Social History   Tobacco Use  . Smoking status: Never Smoker  . Smokeless tobacco: Never Used  Vaping Use  . Vaping Use: Never used  Substance Use Topics  . Alcohol use: No  . Drug use: No         OPHTHALMIC EXAM:  Base Eye Exam    Visual Acuity (Snellen - Linear)      Right Left   Dist [place] 20/50 +    Dist ph  20/30        Tonometry (Tonopen, 9:26 AM)      Right Left   Pressure 17 15       Pupils      Pupils Dark Light Shape React APD   Right PERRL 3 2 Round Brisk None   Left PERRL 3 2 Round Brisk None       Visual Fields (Counting fingers)      Left Right    Restrictions Partial outer superior temporal deficiency Partial outer inferior temporal deficiency       Neuro/Psych    Oriented x3: Yes   Mood/Affect: Normal       Dilation    Both eyes: 1.0% Mydriacyl, 2.5% Phenylephrine @ 9:26 AM        Slit Lamp and Fundus Exam    External Exam      Right Left   External Normal Normal       Slit Lamp Exam      Right Left   Lids/Lashes Normal Normal   Conjunctiva/Sclera White and quiet, with large eyelash surrounded and case by purulent debris in the cul-de-sac and in the nasal fornix White and quiet   Cornea Clear Clear   Anterior Chamber Deep and quiet Deep and quiet   Iris Round and reactive Round and reactive   Lens 2+ Nuclear sclerosis 2+ Nuclear sclerosis   Anterior Vitreous Normal Normal       Fundus Exam      Right Left   Posterior Vitreous Clear vitrectomized Clear vitrectomized   Disc Old fibrous tissue superotemporal, inactive, no traction, no vascularity Normal   C/D Ratio 0.45 0.55   Macula Good focal, microaneurysms, no CSME Good focal, microaneurysms, no CSME   Vessels PDR-quiet PDR-quiet   Periphery Good PRP 360 Good PRP 360          IMAGING AND PROCEDURES  Imaging and Procedures for 03/28/20  OCT, Retina - OU - Both Eyes       Right Eye Quality was good. Scan locations included subfoveal. Central Foveal Thickness: 259. Progression has been stable. Findings include abnormal foveal contour, epiretinal membrane.   Left Eye Quality was good. Scan locations included subfoveal. Central Foveal Thickness: 251. Findings include abnormal foveal contour, epiretinal membrane.   Notes No active maculopathy OU.       Color Fundus Photography Optos - OU - Both Eyes       Right Eye Progression has been stable. Disc findings include increased cup to disc ratio. Macula : microaneurysms.   Left Eye Progression has been stable. Disc findings include increased cup to disc ratio. Macula : microaneurysms.    Notes Good PRP, PDR quiescent, no active disease       Foreign Body Removal, Conjunctiva - OD - Right Eye       Topical anesthesia with proparacaine, foreign body of the large apparently chronic cilia, in the nasal fornix posterior to the  caruncle.  With gaze right, conjunctival material mobilized with a Q-tip                ASSESSMENT/PLAN:  Foreign body in conjunctival sac, right, initial encounter Topical service of the right eye anesthetized and foreign body removed with Q-tip  Controlled type 1 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 ([place]) The nature of regressed proliferative diabetic retinopathy was discussed with the patient. The patient was advised to maintain good glucose, blood pressure, monitor kidney function and serum lipid control as advised by personal physician. Rare risk for reactivation of progression exist with untreated severe anemia, untreated renal failure, untreated heart failure, and smoking. Complete avoidance of smoking was recommended. The chance of recurrent proliferative diabetic retinopathy was discussed as well as the chance of vitreous hemorrhage for which further treatments may be necessary.   Explained to the patient that the quiescent  proliferative diabetic retinopathy disease is unlikely to ever worsen.  Worsening factors would include however severe anemia, hypertension out-of-control or impending renal failure.      ICD-10-CM   1. Controlled type 1 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 ([place])  E10.3553 OCT, Retina - OU - Both Eyes    Color Fundus Photography Optos - OU - Both Eyes  2. Right epiretinal membrane  H35.371 OCT, Retina - OU - Both Eyes  3. Retinal exudates and deposits  H35.9   4. Recurrent erosion of left cornea  H18.832   5. Postsurgical chorioretinal scar of both eyes  H59.813   6. Nuclear sclerotic cataract of both eyes  H25.13   7. Foreign body in conjunctival  sac, right, initial encounter  T15.11XA Foreign Body Removal, Conjunctiva - OD - Right Eye    1.  Foreign body material in the right eye, removed.  Might account for his symptoms of difficulties opening the eye in the mornings and material in the eye.  If the symptoms do not improve, I will recommend referral for eye care, general eye care and cataract evaluation to [facility] eye care or [facility] eye care  2.  Continue blood sugar control and monitoring  3.  Bilateral cataract, accounts for some of the visual acuity dysfunction right eye yet not significant opacity at this time  Ophthalmic Meds Ordered this visit:  No orders of the defined types were placed in this encounter.      Return in about 9 months (around 12/26/2020).  There are no Patient Instructions on file for this visit.   Explained the diagnoses, plan, and follow up with the patient and they expressed understanding.  Patient expressed understanding of the importance of proper follow up care.   Clent Demark Mykelti Goldenstein M.D. Diseases & Surgery of the Retina and Vitreous Retina & Diabetic [place] 03/28/20     Abbreviations: M myopia (nearsighted); A astigmatism; H hyperopia (farsighted); P presbyopia; Mrx spectacle prescription;  CTL contact lenses; OD right eye; OS left eye; OU both eyes  XT exotropia; ET esotropia; PEK punctate epithelial keratitis; PEE punctate epithelial erosions; DES dry eye syndrome; MGD meibomian gland dysfunction; ATs artificial tears; PFAT's preservative free artificial tears; [place] nuclear sclerotic cataract; PSC posterior subcapsular cataract; ERM epi-retinal membrane; PVD posterior vitreous detachment; RD retinal detachment; DM diabetes mellitus; DR diabetic retinopathy; NPDR non-proliferative diabetic retinopathy; PDR proliferative diabetic retinopathy; CSME clinically significant macular edema; DME diabetic macular edema; dbh dot blot hemorrhages; CWS cotton wool spot; POAG primary open angle glaucoma; C/D  cup-to-disc ratio; HVF humphrey visual  field; GVF goldmann visual field; OCT optical coherence tomography; IOP intraocular pressure; BRVO Branch retinal vein occlusion; CRVO central retinal vein occlusion; CRAO central retinal artery occlusion; BRAO branch retinal artery occlusion; RT retinal tear; SB scleral buckle; PPV pars plana vitrectomy; VH Vitreous hemorrhage; PRP panretinal laser photocoagulation; IVK intravitreal kenalog; VMT vitreomacular traction; MH Macular hole;  NVD neovascularization of the disc; NVE neovascularization elsewhere; AREDS age related eye disease study; ARMD age related macular degeneration; POAG primary open angle glaucoma; EBMD epithelial/anterior basement membrane dystrophy; ACIOL anterior chamber intraocular lens; IOL intraocular lens; PCIOL posterior chamber intraocular lens; Phaco/IOL phacoemulsification with intraocular lens placement; [place] photorefractive keratectomy; LASIK laser assisted in situ keratomileusis; HTN hypertension; DM diabetes mellitus; COPD chronic obstructive pulmonary disease

## 2020-03-28 NOTE — Assessment & Plan Note (Signed)
Topical service of the right eye anesthetized and foreign body removed with Q-tip

## 2020-03-31 ENCOUNTER — Other Ambulatory Visit: Payer: Self-pay | Admitting: Hematology and Oncology

## 2020-03-31 DIAGNOSIS — I82A12 Acute embolism and thrombosis of left axillary vein: Secondary | ICD-10-CM

## 2020-03-31 NOTE — Progress Notes (Signed)
a 

## 2020-04-04 ENCOUNTER — Encounter: Payer: Self-pay | Admitting: Endocrinology

## 2020-04-04 ENCOUNTER — Telehealth: Payer: Self-pay | Admitting: Internal Medicine

## 2020-04-04 NOTE — Telephone Encounter (Signed)
Called pt, LVM.   

## 2020-04-04 NOTE — Telephone Encounter (Signed)
Patient would like to talk to someone about his lab results from his visit on 10.05.21, also states he is supposed to get something from cologuard to send in a sample but never received anything.  Patient # [phone]

## 2020-04-05 ENCOUNTER — Telehealth (INDEPENDENT_AMBULATORY_CARE_PROVIDER_SITE_OTHER): Payer: Medicare Other | Admitting: Endocrinology

## 2020-04-05 ENCOUNTER — Telehealth: Payer: Self-pay | Admitting: Endocrinology

## 2020-04-05 ENCOUNTER — Other Ambulatory Visit: Payer: Self-pay

## 2020-04-05 VITALS — Wt 193.0 lb

## 2020-04-05 DIAGNOSIS — E039 Hypothyroidism, unspecified: Secondary | ICD-10-CM

## 2020-04-05 DIAGNOSIS — E1065 Type 1 diabetes mellitus with hyperglycemia: Secondary | ICD-10-CM

## 2020-04-05 DIAGNOSIS — E782 Mixed hyperlipidemia: Secondary | ICD-10-CM | POA: Diagnosis not present

## 2020-04-05 NOTE — Telephone Encounter (Signed)
He will benefit from using the Dexcom sensor instead of the original freestyle libre.  Please see if he can be started with a sample kit.  Currently on the 630 pump

## 2020-04-05 NOTE — Telephone Encounter (Signed)
Appointment scheduled for next Monday

## 2020-04-05 NOTE — Telephone Encounter (Signed)
Message left to call to schedule time to be trained on Dexcom.

## 2020-04-05 NOTE — Progress Notes (Signed)
Subjective:              Patient ID: Johnathan Ross, male   DOB: 01/13/1971, 49 y.o.   MRN: [id number]  I connected with the above-named patient by video enabled telemedicine application and verified that I am speaking with the correct person. The patient was explained the limitations of evaluation and management by telemedicine and the availability of in person appointments.  Patient also understood that there may be a patient responsible charge related to this service . Location of the patient: Patient's home . Location of the provider: Physician office Only the patient and myself were participating in the encounter The patient understood the above statements and agreed to proceed.    Diabetes   Diagnosis: Type 1 diabetes mellitus, date of diagnosis:  1995.   Insulin Pump followup:   CURRENT brand:  Medtronic  630 G   HISTORY: An insulin pump has been in use since 03/13/11.   PUMP settings are basal rate:  12 AM-2 AM = 0.55, 2 AM-6 AM = 0.75,, 6 a.m.= 2.05, 7 AM = 1.90,. 9 AM = 1. 85, 6 PM = 1.7, 9 PM = 0.5, 10 PM = 1.7  Carbohydrate to insulin ratio: 8 AM = 1: 8, at 11 a.m. = 1: 5, after 6 p.m. = 1:7, hyperglycemia correction factor: 1: 35,and after 10 p.m. 1:30, target 120, active insulin time 4 hours.   Recent history:  His A1c has usually been over 8% and now 8.2%   Current blood sugar patterns Evaluated from freestyle Libre and pump download, management and problems identified:  Current management, blood sugar patterns and problems identified     He has difficulty controlling postprandial readings especially in the evenings  Although not entering his insulin doses in his freestyle Elenor Legato appears that he is likely missing some insulin doses at mealtimes especially when blood sugars are near normal before eating and this is causing significant hyperglycemia especially late at night  He has suspended his pump at home at least twice overnight when blood sugars were  low normal or low but does not start the pump back again unless blood sugars above normal  He thinks he is not going to take insulin in the morning before eating a bowl of grits unless the blood sugar is slightly high  He has made changes in his pump settings as recommended on the last visit   CGM download is interpreted as follows   Overall average blood sugar is 2 about the same as before at 184 with significant variability at all times.  Time in range is only slightly better at 52% compared to 49%.  HIGHEST blood sugars are an average of 230 between 10 PM-12 AM  Blood sugars are the lowest between 8-10 AM likely before breakfast  HYPERGLYCEMIA is present at all different times of the day including overnight and postprandially although less so after breakfast  Usually appears to have excessively rebounded from low normal or low readings  Overnight blood sugars start off averaging 250 and then will decrease overall but stay in on 160-170 until after 8 AM with moderate variability.  Has had for the start of hypoglycemia around 3 AM  Otherwise hypoglycemia has occurred minimally with low normal readings at 10 AM or 8:30 PM only and overall hypoglycemia is less than on his last visit   CGM use % of time  94  Average and SD  184+/-35  Time in range    52%  %  Time Above 180  28  % Time above 250  18  % Time Below target  2    PRE-MEAL Fasting Lunch Dinner Bedtime Overall  Glucose range:       Mean/median:  137  179  175  230  177   POST-MEAL PC Breakfast PC Lunch PC Dinner  Glucose range:     Mean/median:  180  193  204       Wt Readings from Last 3 Encounters:  04/05/20 193 lb (87.5 kg)  03/23/20 201 lb 8 oz (91.4 kg)  03/14/20 196 lb 8 oz (89.1 kg)     Lab Results  Component Value Date   HGBA1C 8.2 (H) 03/14/2020   HGBA1C 8.0 (H) 12/03/2019   HGBA1C 7.5 (H) 08/11/2019   Lab Results  Component Value Date   MICROALBUR <0.7 03/01/2020   LDLCALC 79 03/14/2020    CREATININE 1.10 03/14/2020     OTHER active problems discussed today: See review of systems    Allergies as of 04/05/2020      Reactions   Lisinopril Cough      Medication List       Accurate as of April 05, 2020  8:23 AM. If you have any questions, ask your nurse or doctor.        baclofen 10 MG tablet Commonly known as: LIORESAL Take one or two pills three times a day (up to 6 pills/day)   Contour Next Test test strip Generic drug: glucose blood USE AS INSTRUCTED TO CHECK BLOOD SUGARS 8 TIMES DAILY   dalfampridine 10 MG Tb12 Commonly known as: Ampyra Take 1 tablet (10 mg total) by mouth 2 (two) times daily.   insulin aspart 100 UNIT/ML injection Commonly known as: NovoLOG USE UP TO 80 UNITS IN INSULIN PUMP DAILY-DX CODE E10.8   simvastatin 20 MG tablet Commonly known as: ZOCOR TAKE 1 TABLET BY MOUTH EVERYDAY AT BEDTIME   Synthroid 125 MCG tablet Generic drug: levothyroxine TAKE 1 TABLET BY MOUTH 6 DAYS PER WEEK       Allergies:  Allergies  Allergen Reactions  . Lisinopril Cough    Past Medical History:  Diagnosis Date  . Hyperlipidemia   . Hypertension   . Hypogonadism in male 11/01/2016  . Hypothyroidism   . Multiple sclerosis ([place])   . Proliferative diabetic retinopathy(362.02)   . Type 1 diabetes mellitus ([place]) dx'd 1994  . Vitamin D insufficiency 11/29/2016    Past Surgical History:  Procedure Laterality Date  . EYE SURGERY Bilateral    "laser for diabetic retinopathy"  . FRACTURE SURGERY    . IR VENO/EXT/UNI LEFT  03/24/2020  . OPEN REDUCTION INTERNAL FIXATION (ORIF) TIBIA/FIBULA FRACTURE Right 10/30/2016  . ORIF ANKLE FRACTURE Left 2015  . ORIF TIBIA FRACTURE Right 10/30/2016   Procedure: OPEN REDUCTION INTERNAL FIXATION (ORIF) TIBIA FIBULA FRACTURE;  Surgeon: Altamese Holland, MD;  Location: [facility];  Service: Orthopedics;  Laterality: Right;  . RETINAL LASER PROCEDURE Bilateral    "for diabetic retinopathy"    Family History  Problem  Relation Age of Onset  . Diabetes Sister   . Colon cancer Maternal Grandmother   . Colon polyps Maternal Uncle     Social History:  reports that he has never smoked. He has never used smokeless tobacco. He reports that he does not drink alcohol and does not use drugs.    Review of Systems      HYPERCHOLESTEROLEMIA: He has been taking simvastatin 20 mg daily  LDL  is below 100 consistently  Has no increase in ALT  Lab Results  Component Value Date   CHOL 136 03/14/2020   HDL 46.70 03/14/2020   LDLCALC 79 03/14/2020   LDLDIRECT 139.7 04/09/2014   TRIG 52.0 03/14/2020   CHOLHDL 3 03/14/2020   Lab Results  Component Value Date   ALT 31 03/14/2020    HYPERTENSION:  has  been on lisinopril with good control  of blood pressure    BP Readings from Last 3 Encounters:  03/23/20 (!) 142/80  03/14/20 (!) 144/84  03/09/20 (!) 153/93    He has MULTIPLE sclerosis, is followed in [place] by neurologist and still not on treatment  HYPOTHYROIDISM: He has had longstanding primary 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 autoimmune  His thyroid dosage has changed frequently and has had to change his regimen with every visit  His TSH has been frequently variable and not clear why it fluctuates He also does not feel any different whether his thyroid level is high or low More recently has required lower doses   His dose has been reduced to 125 mcg, 6 days/week because of persistently low TSH level He is able to get brand-name Synthroid and is using 90-day prescriptions  He is regular with taking his supplement before breakfast daily TSH is now normal again compared to 3/21   Lab Results  Component Value Date   TSH 6.17 (H) 03/14/2020   TSH 2.24 03/01/2020   TSH 1.41 12/03/2019   FREET4 0.75 03/14/2020   FREET4 1.21 12/03/2019   FREET4 1.33 08/11/2019   He has not been able to sign up for the Covid vaccine     Objective:   Physical Exam  Wt 193 lb (87.5 kg)   BMI 25.46 kg/m             Assessment:      DIABETES type I on insulin pump:   See history of present illness for detailed discussion of current diabetes management, blood sugar patterns and problems identified  A1c is persistently high at 9.2  He is on 630 Medtronic pump with the original freestyle libre sensor  Blood sugars are only within target range 52 % of the time He has periods of hyperglycemia partly related to inadequate or missed boluses or late boluses but mostly has high readings late at night Usually trying to make corrections for his high readings late in the evening Also occasionally has unpredictably low sugars overnight   HYPOTHYROIDISM:  TSH previously was consistent with 125 mcg 6 days a week but now since TSH is trending higher we will have him take 6-1/2 tablets a week Recheck on the next visit   Lipids: Has been well controlled with simvastatin 20 mg  HYPERTENSION: Needs office blood pressure reading checked periodically Urine microalbumin normal    Plan:       Instructions as given in the MyChart message below Needs to try and bolus for every meal regardless of blood sugar before eating Explained to him that the blood sugar will be getting higher with any carbohydrate intake if he does not bolus especially with a larger meal at lunch and dinner   Follow-up 3 months   There are no Patient Instructions on file for this visit.  MyChart message sent with the following information  Insulin pump changes: Basal rate from 9 PM-12 AM = 1.80  At lunch and dinnertime try to bolus 5 to 10 minutes before starting to eat unless the blood sugar is below 100. If  blood sugar is below 130 the pump will reduce the bolus accordingly.  Reduce the carbohydrate coverage at breakfast to 1: 10 instead of  1: 8 and try to bolus at mealtime  When suspending the pump need to resume the pump when the blood sugar is over 100 and heading upwards on the [place].  If able to enter your  insulin bolus on the [place] that would be desirable.  Take half tablet of the Synthroid on Sunday mornings   Elfriede Bonini  Note: This office note was prepared with Estate agent. Any transcriptional errors that result from this process are unintentional.

## 2020-04-11 ENCOUNTER — Other Ambulatory Visit: Payer: Self-pay

## 2020-04-11 ENCOUNTER — Encounter: Payer: Medicare Other | Attending: Endocrinology | Admitting: Nutrition

## 2020-04-11 ENCOUNTER — Other Ambulatory Visit: Payer: Self-pay | Admitting: *Deleted

## 2020-04-11 DIAGNOSIS — E1065 Type 1 diabetes mellitus with hyperglycemia: Secondary | ICD-10-CM | POA: Insufficient documentation

## 2020-04-11 MED ORDER — DEXCOM G6 SENSOR MISC
1 refills | Status: DC
Start: 2020-04-11 — End: 2023-06-20

## 2020-04-11 MED ORDER — DEXCOM G6 TRANSMITTER MISC
0 refills | Status: DC
Start: 2020-04-11 — End: 2023-06-20

## 2020-04-13 NOTE — Progress Notes (Signed)
We discussed the difference between sensor readings and blood sugar readings and he reported good understanding of this.  He was instructed on how to insert sensor, attach transmitter and start a session.  His readings will be going to his phone and both apps were downloaded and linked to [place].  He had no final questions

## 2020-04-13 NOTE — Patient Instructions (Signed)
Attach a new sensor every 10 days Attach a new transmitter every 3 months Call Dexcom if quesitons

## 2020-04-15 ENCOUNTER — Telehealth: Payer: Self-pay | Admitting: Endocrinology

## 2020-04-15 NOTE — Telephone Encounter (Signed)
This was already faxed to [facility] care on 11/15, confirmation was received.

## 2020-04-15 NOTE — Telephone Encounter (Signed)
New message    [place] is waiting on prescription for Dexcom supplies.

## 2020-04-19 ENCOUNTER — Telehealth: Payer: Self-pay | Admitting: Endocrinology

## 2020-04-19 NOTE — Telephone Encounter (Signed)
Patient requests to be called at ph# [phone] re: Patient states he has been waiting for a call back.

## 2020-04-19 NOTE — Telephone Encounter (Signed)
I spoke with patient and let him know that I did fax his forms to [facility] care.

## 2020-05-03 ENCOUNTER — Other Ambulatory Visit: Payer: Self-pay

## 2020-05-03 ENCOUNTER — Ambulatory Visit (INDEPENDENT_AMBULATORY_CARE_PROVIDER_SITE_OTHER): Payer: Medicare Other | Admitting: Vascular Surgery

## 2020-05-03 ENCOUNTER — Encounter: Payer: Self-pay | Admitting: Vascular Surgery

## 2020-05-03 DIAGNOSIS — G540 Brachial plexus disorders: Secondary | ICD-10-CM | POA: Diagnosis not present

## 2020-05-03 NOTE — Progress Notes (Signed)
Patient name: Johnathan Ross MRN: [id number] DOB: 04-22-1971 Sex: male  REASON FOR CONSULT: Evaluate left upper extremity for venous TOS  HPI: Johnathan Ross is a 49 y.o. male, with history of type 1 diabetes and multiple sclerosis who presents for evaluation of venous thoracic outlet syndrome in his left upper extremity.  Patient had a left axillary subclavian vein DVT diagnosed on 02/21/2019 after he had sudden onset of left arm swelling.  He denies any previous thromboembolic events prior to this.  He was on 6 months of Xarelto which he states he is0 still taking.  He has been seen by hematology Dr. Lorenso Courier who ordered a left upper extremity venogram with IR that was done about a month ago and then referred here.  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  He has no significant arm swelling at this time.  He walks with a walker due to his MS.  He does complain of numbness in the left arm that affects his forearm and all 5 fingers but this is only at night.  He has none of this numbness during the day when he is walking with his walker or up doing activities of daily living.  Past Medical History:  Diagnosis Date  . Hyperlipidemia   . Hypertension   . Hypogonadism in male 11/01/2016  . Hypothyroidism   . Multiple sclerosis ([place])   . Proliferative diabetic retinopathy(362.02)   . Type 1 diabetes mellitus ([place]) dx'd 1994  . Vitamin D insufficiency 11/29/2016    Past Surgical History:  Procedure Laterality Date  . EYE SURGERY Bilateral    "laser for diabetic retinopathy"  . FRACTURE SURGERY    . IR VENO/EXT/UNI LEFT  03/24/2020  . OPEN REDUCTION INTERNAL FIXATION (ORIF) TIBIA/FIBULA FRACTURE Right 10/30/2016  . ORIF ANKLE FRACTURE Left 2015  . ORIF TIBIA FRACTURE Right 10/30/2016   Procedure: OPEN REDUCTION INTERNAL FIXATION (ORIF) TIBIA FIBULA FRACTURE;  Surgeon:  Altamese Oakley, MD;  Location: [facility];  Service: Orthopedics;  Laterality: Right;  . RETINAL LASER PROCEDURE Bilateral    "for diabetic retinopathy"    Family History  Problem Relation Age of Onset  . Diabetes Sister   . Colon cancer Maternal Grandmother   . Colon polyps Maternal Uncle     SOCIAL HISTORY: Social History   Socioeconomic History  . Marital status: Single    Spouse name: 12  . Number of children: 1  . Years of education: Not on file  . Highest education level: Not on file  Occupational History  . Occupation: unemployed  Tobacco Use  . Smoking status: Never Smoker  . Smokeless tobacco: Never Used  Vaping Use  . Vaping Use: Never used  Substance and Sexual Activity  . Alcohol use: No  . Drug use: No  . Sexual activity: Not on file  Other Topics Concern  . Not on file  Social History Narrative   Right handed   No caffeine    Lives with girlfriend   Social Determinants of Health   Financial Resource Strain:   . Difficulty of Paying Living Expenses: Not on file  Food Insecurity:   . Worried About Charity fundraiser in the Last Year: Not on file  . Ran Out of Food in the Last Year: Not on file  Transportation Needs:   . Lack of Transportation (Medical): Not on file  .  Lack of Transportation (Non-Medical): Not on file  Physical Activity:   . Days of Exercise per Week: Not on file  . Minutes of Exercise per Session: Not on file  Stress:   . Feeling of Stress : Not on file  Social Connections:   . Frequency of Communication with Friends and Family: Not on file  . Frequency of Social Gatherings with Friends and Family: Not on file  . Attends Religious Services: Not on file  . Active Member of Clubs or Organizations: Not on file  . Attends Archivist Meetings: Not on file  . Marital Status: Not on file  Intimate Partner Violence:   . Fear of Current or Ex-Partner: Not on file  . Emotionally Abused: Not on file  . Physically Abused: Not on  file  . Sexually Abused: Not on file    Allergies  Allergen Reactions  . Lisinopril Cough    Current Outpatient Medications  Medication Sig Dispense Refill  . baclofen (LIORESAL) 10 MG tablet Take one or two pills three times a day (up to 6 pills/day) 180 each 11  . Continuous Blood Gluc Receiver (DEXCOM G5 RECEIVER KIT) DEVI by Does not apply route.    . Continuous Blood Gluc Sensor (DEXCOM G6 SENSOR) MISC Use as directed. 12 each 1  . Continuous Blood Gluc Transmit (DEXCOM G6 TRANSMITTER) MISC Use to monitor blood sugar 1 each 0  . dalfampridine (AMPYRA) 10 MG TB12 Take 1 tablet (10 mg total) by mouth 2 (two) times daily. 180 tablet 3  . glucose blood (CONTOUR NEXT TEST) test strip USE AS INSTRUCTED TO CHECK BLOOD SUGARS 8 TIMES DAILY 300 strip 3  . insulin aspart (NOVOLOG) 100 UNIT/ML injection USE UP TO 80 UNITS IN INSULIN PUMP DAILY-DX CODE E10.8 80 mL 2  . simvastatin (ZOCOR) 20 MG tablet TAKE 1 TABLET BY MOUTH EVERYDAY AT BEDTIME 90 tablet 0  . SYNTHROID 125 MCG tablet TAKE 1 TABLET BY MOUTH 6 DAYS PER WEEK 72 tablet 1   No current facility-administered medications for this visit.    REVIEW OF SYSTEMS:  '[X]'  denotes positive finding, '[ ]'  denotes negative finding Cardiac  Comments:  Chest pain or chest pressure:    Shortness of breath upon exertion:    Short of breath when lying flat:    Irregular heart rhythm:        Vascular    Pain in calf, thigh, or hip brought on by ambulation:    Pain in feet at night that wakes you up from your sleep:     Blood clot in your veins:    Leg swelling:         Pulmonary    Oxygen at home:    Productive cough:     Wheezing:         Neurologic    Sudden weakness in arms or legs:     Sudden numbness in arms or legs:     Sudden onset of difficulty speaking or slurred speech:    Temporary loss of vision in one eye:     Problems with dizziness:         Gastrointestinal    Blood in stool:     Vomited blood:         Genitourinary     Burning when urinating:     Blood in urine:        Psychiatric    Major depression:         Hematologic  Bleeding problems:    Problems with blood clotting too easily:        Skin    Rashes or ulcers:        Constitutional    Fever or chills:      PHYSICAL EXAM: Vitals:   05/03/20 1117  BP: (!) 153/84  Pulse: 87  Resp: 16  Temp: 98.8 F (37.1 C)  TempSrc: Temporal  SpO2: 98%  Weight: 195 lb (88.5 kg)  Height: '6\' 1"'  (1.854 m)    GENERAL: The patient is a well-nourished male, in no acute distress. The vital signs are documented above. CARDIAC: There is a regular rate and rhythm.  VASCULAR:  Left radial pulse 2+ palpable No significant left arm edema EAST test negative after 3 minutes - completed test and did not reproduce arm numbness PULMONARY: No respiratory distress. ABDOMEN: Soft and non-tender with normal pitched bowel sounds.  MUSCULOSKELETAL: There are no major deformities or cyanosis. NEUROLOGIC: No focal weakness or paresthesias are detected. SKIN: There are no ulcers or rashes noted. PSYCHIATRIC: The patient has a normal affect.  DATA:   Previously reviewed IR venogram that shows his left subclavian vein is chronically occluded with multiple collaterals in the chest wall and filling of the IJ on the left through collaterals  Assessment/Plan:  49 year old male sent for evaluation of venous TOS after he presented with a left axillary subclavian vein DVT in September 2020.  I reviewed with him and his sister that I do think his initial presentation was consistent with Paget Schroetter for venous TOS.  I discussed in detail that if I had seen him at the time of acute DVT would likely have recommended thrombolysis with subsequent first rib resection to try and alleviate his arm symptoms.  Now that this is chronic DVT and  his venogram pictures from IR show left subclavian vein is chronically occluded with multiple collaterals in the chest wall.  He has no  left arm swelling.  I think it would be hard to justify doing first rib at this time given that his arm seems fairly normal.  He does have this vague complaint of numbness in the left arm at night when he sleeping.  He had a negative [place] test in our office today and none of his numbness symptoms are replicated when he is walking with his walker or up during the day.  Unclear if he could have some neurogenic component as well with compression on the brachial plexus.  Symptoms are a bit vague and only at night.  I think before recommending a first rib resection that could potentially compromise a number of the venous collaterals noted on his venogram, I would send him to physical therapy and I will see him in 3 months.   Marty Heck, MD [facility] Office: [phone]

## 2020-05-09 ENCOUNTER — Ambulatory Visit (HOSPITAL_COMMUNITY)
Admission: EM | Admit: 2020-05-09 | Discharge: 2020-05-09 | Disposition: A | Discharge status: Other Acute Inpatient Hospital | Payer: Medicare Other | Attending: Family Medicine | Admitting: Family Medicine

## 2020-05-09 ENCOUNTER — Ambulatory Visit (INDEPENDENT_AMBULATORY_CARE_PROVIDER_SITE_OTHER): Payer: Medicare Other

## 2020-05-09 ENCOUNTER — Other Ambulatory Visit: Payer: Self-pay

## 2020-05-09 DIAGNOSIS — S2232XA Fracture of one rib, left side, initial encounter for closed fracture: Secondary | ICD-10-CM

## 2020-05-09 DIAGNOSIS — R0781 Pleurodynia: Secondary | ICD-10-CM

## 2020-05-09 DIAGNOSIS — S2231XA Fracture of one rib, right side, initial encounter for closed fracture: Secondary | ICD-10-CM | POA: Diagnosis not present

## 2020-05-09 NOTE — ED Triage Notes (Signed)
Pt reports he fell today and noew has Lt rib pain.

## 2020-05-09 NOTE — Discharge Instructions (Addendum)
Watch for infection Take OTC medicine for pain Follow up with your PCP

## 2020-05-09 NOTE — ED Provider Notes (Signed)
[name]    CSN: [id number] Arrival date & time: 05/09/20  1308      History   Chief Complaint Chief Complaint  Patient presents with  . Fall  . Rib Injury    LT side    HPI Johnathan Ross is a 49 y.o. male.   HPI   Patient has MS.  He has some problems with balance and strength.  He fell today while getting out of his car, landed over on his left side and his left ribs hit a gas can.  They are very painful.  Hurts with breathing.  Hurts with moving.  No other injuries. Patient is an insulin-dependent diabetic.  He has an insulin pump.  He states his sugars are well controlled.  Past Medical History:  Diagnosis Date  . Hyperlipidemia   . Hypertension   . Hypogonadism in male 11/01/2016  . Hypothyroidism   . Multiple sclerosis ([place])   . Proliferative diabetic retinopathy(362.02)   . Type 1 diabetes mellitus ([place]) dx'd 1994  . Vitamin D insufficiency 11/29/2016    Patient Active Problem List   Diagnosis Date Noted  . Thoracic outlet syndrome 05/03/2020  . Foreign body in conjunctival sac, right, initial encounter 03/28/2020  . Controlled type 1 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 ([place]) 03/24/2020  . Right epiretinal membrane 03/24/2020  . Retinal exudates and deposits 03/24/2020  . Recurrent erosion of left cornea 03/24/2020  . 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 Nuclear sclerotic cataract of both eyes 03/24/2020  . Gait disorder 03/23/2020  . Cough due to ACE inhibitor 03/03/2020  . Benign prostatic hyperplasia without lower urinary tract symptoms 03/01/2020  . Diabetic gastroparesis associated with type 1 diabetes mellitus ([place]) 03/01/2020  . Uncontrolled type 1 diabetes mellitus with hyperglycemia ([place]) 03/01/2020  . Gait disturbance 08/19/2019  . Acquired diplegia ([place]) 08/19/2019  . Vitamin D insufficiency 11/29/2016  . Hypogonadism in male 11/01/2016  . Type 1 diabetes mellitus with diabetic  polyneuropathy ([place])   . Hypothyroidism   . Hypertension   . Abnormal liver function tests 03/06/2013  . Type I (juvenile type) diabetes mellitus without mention of complication, uncontrolled 12/03/2007  . HYPERCHOLESTEROLEMIA 11/12/2007  . MULTIPLE SCLEROSIS 08/29/2007  . PROLIFERATIVE DIABETIC RETINOPATHY 08/29/2007    Past Surgical History:  Procedure Laterality Date  . EYE SURGERY Bilateral    "laser for diabetic retinopathy"  . FRACTURE SURGERY    . IR VENO/EXT/UNI LEFT  03/24/2020  . OPEN REDUCTION INTERNAL FIXATION (ORIF) TIBIA/FIBULA FRACTURE Right 10/30/2016  . ORIF ANKLE FRACTURE Left 2015  . ORIF TIBIA FRACTURE Right 10/30/2016   Procedure: OPEN REDUCTION INTERNAL FIXATION (ORIF) TIBIA FIBULA FRACTURE;  Surgeon: Altamese Osino, MD;  Location: [facility];  Service: Orthopedics;  Laterality: Right;  . RETINAL LASER PROCEDURE Bilateral    "for diabetic retinopathy"       Home Medications    Prior to Admission medications   Medication Sig Start Date End Date Taking? Authorizing Provider  baclofen (LIORESAL) 10 MG tablet Take one or two pills three times a day (up to 6 pills/day) 12/23/19  Yes Sater, Nanine Means, MD  Continuous Blood Gluc Receiver ([place] KIT) DEVI by Does not apply route.   Yes [provider]  Continuous Blood Gluc Sensor (DEXCOM G6 SENSOR) MISC Use as directed. 04/11/20  Yes Elayne Snare, MD  Continuous Blood Gluc Transmit (DEXCOM G6 TRANSMITTER) MISC Use to monitor blood sugar 04/11/20  Yes Elayne Snare, MD  dalfampridine (AMPYRA) 10 MG TB12 Take 1 tablet (10 mg total) by mouth 2 (two) times daily. 12/23/19  Yes Sater, Nanine Means, MD  glucose blood (CONTOUR NEXT TEST) test strip USE AS INSTRUCTED TO CHECK BLOOD SUGARS 8 TIMES DAILY 06/11/19  Yes Elayne Snare, MD  simvastatin (ZOCOR) 20 MG tablet TAKE 1 TABLET BY MOUTH EVERYDAY AT BEDTIME 02/09/20  Yes Elayne Snare, MD  SYNTHROID 125 MCG tablet TAKE 1 TABLET BY MOUTH 6 DAYS PER WEEK 01/25/20  Yes  Elayne Snare, MD  insulin aspart (NOVOLOG) 100 UNIT/ML injection USE UP TO 80 UNITS IN INSULIN PUMP DAILY-DX CODE E10.8 09/16/19   Elayne Snare, MD    Family History Family History  Problem Relation Age of Onset  . Diabetes Sister   . Colon cancer Maternal Grandmother   . Colon polyps Maternal Uncle     Social History Social History   Tobacco Use  . Smoking status: Never Smoker  . Smokeless tobacco: Never Used  Vaping Use  . Vaping Use: Never used  Substance Use Topics  . Alcohol use: No  . Drug use: No     Allergies   Lisinopril   Review of Systems Review of Systems See HPI  Physical Exam Triage Vital Signs ED Triage Vitals  Enc Vitals Group     BP 05/09/20 1504 (!) 150/74     Pulse Rate 05/09/20 1504 97     Resp 05/09/20 1504 18     Temp 05/09/20 1504 98.4 F (36.9 C)     Temp Source 05/09/20 1504 Oral     SpO2 05/09/20 1504 100 %     Weight 05/09/20 1507 194 lb 14.2 oz (88.4 kg)     Height 05/09/20 1507 6' 1" (1.854 m)     Head Circumference --      Peak Flow --      Pain Score 05/09/20 1506 5     Pain Loc --      Pain Edu? --      Excl. in [place]? --    No data found.  Updated Vital Signs BP (!) 150/74 (BP Location: Right Arm)   Pulse 97   Temp 98.4 F (36.9 C) (Oral)   Resp 18   Ht 6' 1" (1.854 m)   Wt 88.4 kg   SpO2 100%   BMI 25.71 kg/m        Physical Exam Constitutional:      General: He is not in acute distress.    Appearance: He is well-developed and well-nourished.     Comments: In wheelchair.  He feels uncomfortable  HENT:     Head: Normocephalic and atraumatic.     Nose:     Comments: Mask is in place    Mouth/Throat:     Mouth: Oropharynx is clear and moist.  Eyes:     Conjunctiva/sclera: Conjunctivae normal.     Pupils: Pupils are equal, round, and reactive to light.  Cardiovascular:     Rate and Rhythm: Normal rate.  Pulmonary:     Effort: Pulmonary effort is normal. No respiratory distress.     Breath sounds: Normal  breath sounds.    Chest:     Chest wall: Tenderness present.  Abdominal:     General: There is no distension.     Palpations: Abdomen is soft.  Musculoskeletal:        General: No edema. Normal range of motion.     Cervical back: Normal range of motion.  Skin:  General: Skin is warm and dry.  Neurological:     Mental Status: He is alert.      UC Treatments / Results  Labs (all labs ordered are listed, but only abnormal results are displayed) Labs Reviewed - No data to display  EKG   Radiology DG Ribs Unilateral W/Chest Left  Result Date: 05/09/2020 CLINICAL DATA:  Fall left-sided rib pain EXAM: LEFT RIBS AND CHEST - 3+ VIEW COMPARISON:  07/28/2012 FINDINGS: Single-view chest demonstrates no focal opacity or pleural effusion. Normal cardiomediastinal silhouette. No pneumothorax. Left rib series demonstrates an acute displaced right tenth anterolateral rib fracture IMPRESSION: Acute displaced right tenth anterolateral rib fracture. No pneumothorax or pleural effusion. Electronically Signed   By: Donavan Foil M.D.   On: 05/09/2020 15:54    Procedures Procedures (including critical care time)  Medications Ordered in UC Medications - No data to display  Initial Impression / Assessment and Plan / UC Course  I have reviewed the triage vital signs and the nursing notes.  Pertinent labs & imaging results that were available during my care of the patient were reviewed by me and considered in my medical decision making (see chart for details).     Discussed rib fractures.  Prevention of pneumonia.  Deep breathing cough.  Reasons not to use rib belt. Final Clinical Impressions(s) / UC Diagnoses   Final diagnoses:  Closed fracture of one rib of left side, initial encounter     Discharge Instructions     Watch for infection Take OTC medicine for pain Follow up with your PCP   ED Prescriptions    None     PDMP not reviewed this encounter.   Raylene Everts,  MD 05/09/20 [phone]

## 2020-05-26 ENCOUNTER — Telehealth: Payer: Self-pay | Admitting: Endocrinology

## 2020-05-26 NOTE — Telephone Encounter (Signed)
Do you have any idea how to do this? Or does he need to call Dexcom support?

## 2020-05-26 NOTE — Telephone Encounter (Signed)
He has to call Dexcom

## 2020-05-26 NOTE — Telephone Encounter (Signed)
Patient called to get information on how to re-attached Dexcom G6 to his new mobile phone.  Call back number [phone]

## 2020-06-10 ENCOUNTER — Other Ambulatory Visit: Payer: Self-pay | Admitting: Endocrinology

## 2020-06-10 DIAGNOSIS — E782 Mixed hyperlipidemia: Secondary | ICD-10-CM

## 2020-07-11 ENCOUNTER — Other Ambulatory Visit: Payer: Medicare Other

## 2020-07-12 ENCOUNTER — Other Ambulatory Visit (INDEPENDENT_AMBULATORY_CARE_PROVIDER_SITE_OTHER): Payer: Medicare Other

## 2020-07-12 ENCOUNTER — Other Ambulatory Visit: Payer: Self-pay

## 2020-07-12 DIAGNOSIS — E039 Hypothyroidism, unspecified: Secondary | ICD-10-CM

## 2020-07-12 DIAGNOSIS — E1065 Type 1 diabetes mellitus with hyperglycemia: Secondary | ICD-10-CM

## 2020-07-12 LAB — COMPREHENSIVE METABOLIC PANEL
ALT: 21 U/L (ref 0–53)
AST: 22 U/L (ref 0–37)
Albumin: 4.1 g/dL (ref 3.5–5.2)
Alkaline Phosphatase: 89 U/L (ref 39–117)
BUN: 15 mg/dL (ref 6–23)
CO2: 31 mEq/L (ref 19–32)
Calcium: 9 mg/dL (ref 8.4–10.5)
Chloride: 105 mEq/L (ref 96–112)
Creatinine, Ser: 1.24 mg/dL (ref 0.40–1.50)
GFR: 69.03 mL/min (ref >60.00)
Glucose, Bld: 159 mg/dL — ABNORMAL HIGH (ref 70–99)
Potassium: 4.2 mEq/L (ref 3.5–5.1)
Sodium: 140 mEq/L (ref 135–145)
Total Bilirubin: 0.5 mg/dL (ref 0.2–1.2)
Total Protein: 7 g/dL (ref 6.0–8.3)

## 2020-07-12 LAB — T4, FREE: Free T4: 1.06 ng/dL (ref 0.60–1.60)

## 2020-07-12 LAB — TSH: TSH: 1.07 u[IU]/mL (ref 0.35–4.50)

## 2020-07-12 LAB — HEMOGLOBIN A1C: Hgb A1c MFr Bld: 7.3 % — ABNORMAL HIGH (ref 4.6–6.5)

## 2020-07-14 ENCOUNTER — Telehealth: Payer: Self-pay | Admitting: Neurology

## 2020-07-14 ENCOUNTER — Other Ambulatory Visit: Payer: Self-pay

## 2020-07-14 ENCOUNTER — Encounter: Payer: Self-pay | Admitting: Endocrinology

## 2020-07-14 ENCOUNTER — Telehealth (INDEPENDENT_AMBULATORY_CARE_PROVIDER_SITE_OTHER): Payer: Medicare Other | Admitting: Endocrinology

## 2020-07-14 VITALS — Ht 72.99 in | Wt 196.0 lb

## 2020-07-14 DIAGNOSIS — E039 Hypothyroidism, unspecified: Secondary | ICD-10-CM | POA: Diagnosis not present

## 2020-07-14 DIAGNOSIS — R269 Unspecified abnormalities of gait and mobility: Secondary | ICD-10-CM

## 2020-07-14 DIAGNOSIS — E782 Mixed hyperlipidemia: Secondary | ICD-10-CM

## 2020-07-14 DIAGNOSIS — G830 Diplegia of upper limbs: Secondary | ICD-10-CM

## 2020-07-14 DIAGNOSIS — E1065 Type 1 diabetes mellitus with hyperglycemia: Secondary | ICD-10-CM

## 2020-07-14 DIAGNOSIS — G35 Multiple sclerosis: Secondary | ICD-10-CM

## 2020-07-14 DIAGNOSIS — I10 Essential (primary) hypertension: Secondary | ICD-10-CM

## 2020-07-14 MED ORDER — LOSARTAN POTASSIUM 50 MG PO TABS: 50.0000 mg | ORAL_TABLET | Freq: Every day | ORAL | 3 refills | Status: DC

## 2020-07-14 NOTE — Progress Notes (Signed)
Subjective:              Patient ID: Johnathan Ross, male   DOB: 27-Mar-1971, 50 y.o.   MRN: [id number]  I connected with the above-named patient by video enabled telemedicine application and verified that I am speaking with the correct person. The patient was explained the limitations of evaluation and management by telemedicine and the availability of in person appointments.  Patient also understood that there may be a patient responsible charge related to this service . Location of the patient: Patient's home . Location of the provider: Physician office Only the patient and myself were participating in the encounter The patient understood the above statements and agreed to proceed.    Diabetes   Diagnosis: Type 1 diabetes mellitus, date of diagnosis:  1995.   Insulin Pump followup:   CURRENT brand:  Medtronic  630 G   HISTORY: An insulin pump has been in use since 03/13/11.   PUMP settings are basal rate:  12 AM-2 AM = 0.8, 2 AM- 4 AM = 0.8, 4 AM-6 AM = 1.15,, 6 a.m.-9 AM = 2.05, 9 AM = 1. 85, 6 PM = 1.8, 9 PM = 1.60.  Carbohydrate to insulin ratio: 8 AM = 1: 8, 12 noon = 1: 5, after 6 p.m. = 1:6, 10 PM = 1: 10 Hyperglycemia correction factor: 1: 35,and after 10 p.m. 1: 45, target 120, active insulin time 4 hours.   Recent history:  His A1c has usually been over 8% and now 7.3%   Current blood sugar patterns Evaluated from freestyle Libre and pump download, management and problems identified:  Current management, blood sugar patterns and problems identified     He has been using the Dexcom sensor instead of the freestyle anything this is more accurate  He is concerned about blood sugars going up late in the evening  However even though he thinks he is bolusing when he starts eating at dinnertime which is about 7 PM most of his boluses are after 8 PM, sometimes as late as midnight  Frequently eating peanut butter sandwiches at dinnertime  He has suspended his  pump overnight periodically 6 times in the last 2 weeks possibly when the blood sugar has been low normal, however subsequently his blood sugar is usually over 200 when he is resuming the pump  Overall hypoglycemia has been minimal in the last 2 weeks on the sensor record  He has made changes in his pump settings as recommended on the last visit   CGM Dexcom G6 download for the last 2 weeks is interpreted as follows   Blood sugars are still showing significant variability especially overnight  HIGHEST blood sugars are late evenings with average blood sugar going above target range around 8 PM until at least 2 AM  OVERNIGHT blood sugars start of averaging about 230 but on an average are still around 180 through the night afternoon 2 AM  HYPOGLYCEMIA has been minimal and only transiently around 4 AM and once around 5-6 PM  POSTPRANDIAL readings appear to be much higher after dinner rising about 100 mg on average  After breakfast and lunch blood sugars are not rising as much    CGM use % of time  97  2-week average standard deviation  176/60  Time in range     55   % was 52  % Time Above 180  44  % Time above 250  10  % Time Below 70  0.8  PRE-MEAL Fasting Lunch Dinner Bedtime Overall  Glucose range:       Averages:  165   148  138    POST-MEAL PC Breakfast PC Lunch PC Dinner  Glucose range:     Averages:    212    Past data:  CGM use % of time  94  Average and SD  184+/-35  Time in range    52%  % Time Above 180  28  % Time above 250  18  % Time Below target  2    PRE-MEAL Fasting Lunch Dinner Bedtime Overall  Glucose range:       Mean/median:  137  179  175  230  177   POST-MEAL PC Breakfast PC Lunch PC Dinner  Glucose range:     Mean/median:  180  193  204      Wt Readings from Last 3 Encounters:  05/09/20 194 lb 14.2 oz (88.4 kg)  05/03/20 195 lb (88.5 kg)  04/05/20 193 lb (87.5 kg)     Lab Results  Component Value Date   HGBA1C 7.3 (H)  07/12/2020   HGBA1C 8.2 (H) 03/14/2020   HGBA1C 8.0 (H) 12/03/2019   Lab Results  Component Value Date   MICROALBUR <0.7 03/01/2020   LDLCALC 79 03/14/2020   CREATININE 1.24 07/12/2020     OTHER active problems discussed today: See review of systems    Allergies as of 07/14/2020      Reactions   Lisinopril Cough      Medication List       Accurate as of July 14, 2020  8:14 AM. If you have any questions, ask your nurse or doctor.        baclofen 10 MG tablet Commonly known as: LIORESAL Take one or two pills three times a day (up to 6 pills/day)   Contour Next Test test strip Generic drug: glucose blood USE AS INSTRUCTED TO CHECK BLOOD SUGARS 8 TIMES DAILY   dalfampridine 10 MG Tb12 Commonly known as: Ampyra Take 1 tablet (10 mg total) by mouth 2 (two) times daily.   Dexcom G5 Receiver Kit Devi by Does not apply route.   Dexcom G6 Sensor Misc Use as directed.   Dexcom G6 Transmitter Misc Use to monitor blood sugar   insulin aspart 100 UNIT/ML injection Commonly known as: NovoLOG USE UP TO 80 UNITS IN INSULIN PUMP DAILY-DX CODE E10.8   simvastatin 20 MG tablet Commonly known as: ZOCOR TAKE 1 TABLET BY MOUTH EVERYDAY AT BEDTIME   Synthroid 125 MCG tablet Generic drug: levothyroxine TAKE 1 TABLET BY MOUTH 6 DAYS PER WEEK       Allergies:  Allergies  Allergen Reactions  . Lisinopril Cough    Past Medical History:  Diagnosis Date  . Hyperlipidemia   . Hypertension   . Hypogonadism in male 11/01/2016  . Hypothyroidism   . Multiple sclerosis ([place])   . Proliferative diabetic retinopathy(362.02)   . Type 1 diabetes mellitus ([place]) dx'd 1994  . Vitamin D insufficiency 11/29/2016    Past Surgical History:  Procedure Laterality Date  . EYE SURGERY Bilateral    "laser for diabetic retinopathy"  . FRACTURE SURGERY    . IR VENO/EXT/UNI LEFT  03/24/2020  . OPEN REDUCTION INTERNAL FIXATION (ORIF) TIBIA/FIBULA FRACTURE Right 10/30/2016  . ORIF ANKLE  FRACTURE Left 2015  . ORIF TIBIA FRACTURE Right 10/30/2016   Procedure: OPEN REDUCTION INTERNAL FIXATION (ORIF) TIBIA FIBULA FRACTURE;  Surgeon: Altamese Gilmer, MD;  Location:  [facility];  Service: Orthopedics;  Laterality: Right;  . RETINAL LASER PROCEDURE Bilateral    "for diabetic retinopathy"    Family History  Problem Relation Age of Onset  . Diabetes Sister   . Colon cancer Maternal Grandmother   . Colon polyps Maternal Uncle     Social History:  reports that he has never smoked. He has never used smokeless tobacco. He reports that he does not drink alcohol and does not use drugs.    Review of Systems      HYPERCHOLESTEROLEMIA: He has been taking simvastatin 20 mg daily  LDL is below 100 consistently  Abnormal liver function consistently now  Lab Results  Component Value Date   CHOL 136 03/14/2020   HDL 46.70 03/14/2020   LDLCALC 79 03/14/2020   LDLDIRECT 139.7 04/09/2014   TRIG 52.0 03/14/2020   CHOLHDL 3 03/14/2020   Lab Results  Component Value Date   ALT 21 07/12/2020    HYPERTENSION: He had been on lisinopril with good control  of blood pressure Apparently his PCP stopped the lisinopril because he was having cough and has not taken any other medications He thinks his cough is better Blood pressure appears to be persistently high   BP Readings from Last 3 Encounters:  05/09/20 (!) 150/74  05/03/20 (!) 153/84  03/23/20 (!) 142/80    He has MULTIPLE sclerosis, is followed in [place] by neurologist and still not on treatment  HYPOTHYROIDISM: He has had longstanding primary 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 autoimmune  His thyroid dosage has been changed frequently in the past  His TSH has been frequently variable and not clear why it fluctuates, more recently been requiring lower doses He also does not feel any different whether his thyroid level is high or low  His dose has been changed to 125 mcg, 6.5 days/week on the last visit  He is able to get brand-name  Synthroid and is using 90-day prescriptions  He is regular with taking his supplement before breakfast daily TSH is now normal   Lab Results  Component Value Date   TSH 1.07 07/12/2020   TSH 6.17 (H) 03/14/2020   TSH 2.24 03/01/2020   FREET4 1.06 07/12/2020   FREET4 0.75 03/14/2020   FREET4 1.21 12/03/2019        Objective:   Physical Exam  There were no vitals taken for this visit.           Assessment:      DIABETES type I on insulin pump:   See history of present illness for detailed discussion of current diabetes management, blood sugar patterns and problems identified  A1c is much better than usual at 6.3  He is on 630 Medtronic pump with the Dexcom 6 sensor now  Although his blood sugars are only 55% within the target range his A1c indicates better reading Improvement may be related to stable blood sugars during the daytime, possibly less rebounding from low sugars overnight and better compliance using Dexcom for constant monitoring  He has periods of hyperglycemia mostly in the late evenings He thinks he is bolusing before eating dinner but this is not apparent on the pump download Also he thinks that he is not getting enough insulin to cover high readings and is frequently overriding the pump Blood sugars may trending lower overnight but this is quite variable, recently no hypoglycemia   HYPOTHYROIDISM:  TSH normal as above   Lipids: Has been well controlled with simvastatin 20 mg  HYPERTENSION: Needs to be  on treatment and blood pressure readings appear to be higher when checked He has not been given an alternative to lisinopril which was possibly causing a cough     Plan:       Needs to try and bolus for every meal before starting to eat and at least 10 to 15 minutes in the evening before dinner Also basal rate changes as follows 2 AM = 0.70, 9 PM = 1.80 and 10 PM = 1.85 Carb ratio at 6 PM to be 1: 5 Sensitivity 1: 30 between 8 PM-12  AM Avoid suspending the pump for excessive periods of time overnight and only until blood sugars are normalized He will look into the availability of the Medtronic 770 pump upgrade  No change in Synthroid regimen  Losartan 50 mg daily for hypertension and renal benefits  Follow-up 3 months preferably in the office   There are no Patient Instructions on file for this visit.     Elayne Snare  Note: This office note was prepared with Estate agent. Any transcriptional errors that result from this process are unintentional.

## 2020-07-14 NOTE — Telephone Encounter (Signed)
Called pt back. He would like PT referral to go to [facility] next to [place]. States he spoke with Dr. Felecia Shelling at last visit about doing ongoing PT. Advised I will place referral for him. He verbalized understanding

## 2020-07-14 NOTE — Telephone Encounter (Signed)
Pt called wanting to know if he can get a new PT referral so that he can continue his therapy. Please advise.

## 2020-07-15 ENCOUNTER — Other Ambulatory Visit: Payer: Self-pay | Admitting: Endocrinology

## 2020-07-19 ENCOUNTER — Telehealth: Payer: Medicare Other | Admitting: Endocrinology

## 2020-07-28 ENCOUNTER — Other Ambulatory Visit: Payer: Self-pay | Admitting: Endocrinology

## 2020-07-28 DIAGNOSIS — E1065 Type 1 diabetes mellitus with hyperglycemia: Secondary | ICD-10-CM

## 2020-08-02 ENCOUNTER — Encounter: Payer: Self-pay | Admitting: Physical Therapy

## 2020-08-02 ENCOUNTER — Ambulatory Visit: Payer: Medicare Other | Admitting: Vascular Surgery

## 2020-08-02 ENCOUNTER — Other Ambulatory Visit: Payer: Self-pay | Admitting: Endocrinology

## 2020-08-02 DIAGNOSIS — E1065 Type 1 diabetes mellitus with hyperglycemia: Secondary | ICD-10-CM

## 2020-08-02 NOTE — Therapy (Signed)
[facility] [address] [place], [place], [address] Phone: [phone]   Fax:  [phone]  Patient Details  Name: AARIB PULIDO MRN: [id number] Date of Birth: 06-15-1970 Referring Provider:  No ref. provider found  Encounter Date: 08/02/2020   Opened in error.   [name] 08/02/2020, 10:17 PM  [facility] [address] [place], [place], [address] Phone: [phone]   Fax:  [phone]

## 2020-08-03 ENCOUNTER — Other Ambulatory Visit: Payer: Self-pay

## 2020-08-03 ENCOUNTER — Ambulatory Visit: Payer: Medicare Other | Attending: Neurology

## 2020-08-03 DIAGNOSIS — R2681 Unsteadiness on feet: Secondary | ICD-10-CM | POA: Diagnosis not present

## 2020-08-03 DIAGNOSIS — R296 Repeated falls: Secondary | ICD-10-CM | POA: Insufficient documentation

## 2020-08-03 DIAGNOSIS — M6281 Muscle weakness (generalized): Secondary | ICD-10-CM | POA: Diagnosis not present

## 2020-08-03 DIAGNOSIS — R2689 Other abnormalities of gait and mobility: Secondary | ICD-10-CM | POA: Diagnosis not present

## 2020-08-03 NOTE — Therapy (Signed)
[facility] [address] [place], [place], [address] Phone: [phone]   Fax:  [phone]  Physical Therapy Evaluation  Patient Details  Name: Johnathan Ross MRN: [id number] Date of Birth: 1970-08-04 Referring Provider (PT): Britt Bottom, MD   Encounter Date: 08/03/2020   PT End of Session - 08/03/20 0848    Visit Number 1    Number of Visits 17    Date for PT Re-Evaluation 11/01/20   60 day poc, 90 day cert   Authorization Type Medicare Part A and B; 10th visit PN    PT Start Time 0845    PT Stop Time 0932    PT Time Calculation (min) 47 min    Equipment Utilized During Treatment Gait belt    Activity Tolerance Patient tolerated treatment well    Behavior During Therapy WFL for tasks assessed/performed           Past Medical History:  Diagnosis Date  . Hyperlipidemia   . Hypertension   . Hypogonadism in male 11/01/2016  . Hypothyroidism   . Multiple sclerosis ([place])   . Proliferative diabetic retinopathy(362.02)   . Type 1 diabetes mellitus ([place]) dx'd 1994  . Vitamin D insufficiency 11/29/2016    Past Surgical History:  Procedure Laterality Date  . EYE SURGERY Bilateral    "laser for diabetic retinopathy"  . FRACTURE SURGERY    . IR VENO/EXT/UNI LEFT  03/24/2020  . OPEN REDUCTION INTERNAL FIXATION (ORIF) TIBIA/FIBULA FRACTURE Right 10/30/2016  . ORIF ANKLE FRACTURE Left 2015  . ORIF TIBIA FRACTURE Right 10/30/2016   Procedure: OPEN REDUCTION INTERNAL FIXATION (ORIF) TIBIA FIBULA FRACTURE;  Surgeon: Altamese Bethel, MD;  Location: [facility];  Service: Orthopedics;  Laterality: Right;  . RETINAL LASER PROCEDURE Bilateral    "for diabetic retinopathy"    There were no vitals filed for this visit.    Subjective Assessment - 08/03/20 0848    Subjective Pt is returning as he wants to get back to working with PT. Pt does not feel that his walking has gotten any worse or any better. Pt is walking with walker  most of time. Does use a cane when goes out to his building in back to work out. Denies any recent MS flares. Reports that he tries to push himself even when he is tired. Is falling about 1x/week and able to get himself up. Pt has relapsing remitting MS and was diagnosed in 2007.    Pertinent History multiple falls, vitamin D insufficiency, Type 1 IDDM, diabetic retinopathy, hypothyroidism, HTN, hyperlipidemia, R tibia fracture, L ankle fracture with ORIF    Patient Stated Goals Wants to get back to working on his walking and strength.    Currently in Pain? No/denies              [facility] PT Assessment - 08/03/20 0850      Assessment   Medical Diagnosis MS    Referring Provider (PT) Sater, Nanine Means, MD    Onset Date/Surgical Date 07/14/20    Hand Dominance Right    Prior Therapy Pt most recently had PT up until last October      Precautions   Precautions Fall    Other Brace/Splint AFO RLE      Balance Screen   Has the patient fallen in the past 6 months Yes    How many times? weekly   one fall in December with rib fracture   Has the patient had a decrease in  activity level because of a fear of falling?  No    Is the patient reluctant to leave their home because of a fear of falling?  No      Home Social worker Private residence    Living Arrangements Other (Comment)   girlfriend   Available Help at Discharge Family    Type of [place] to enter    Entrance Stairs-Number of Steps 6    Entrance Stairs-Rails Can reach both    [place] One level    [place] - 2 wheels;Cane - single point   loftstrand crutches     Prior Function   Level of Independence Independent with basic ADLs;Independent with community mobility with device    Vocation On disability    Leisure draw, wood working      Charity fundraiser Status Within Functional Limits for tasks assessed      Observation/Other Assessments-Edema    Edema --    pitting edema BLE     Sensation   Light Touch Appears Intact      Tone   Assessment Location Right Lower Extremity;Left Lower Extremity   spasticity with extensor tone most prevalant when stands     ROM / Strength   AROM / PROM / Strength Strength      Strength   Strength Assessment Site Shoulder;Elbow;Hand;Hip;Knee;Ankle    Right/Left Shoulder Right;Left    Right Shoulder Flexion 5/5    Left Shoulder Flexion 5/5    Right/Left Elbow Right;Left    Right Elbow Flexion 5/5    Right Elbow Extension 5/5    Left Elbow Flexion 5/5    Left Elbow Extension 5/5    Right/Left hand Right;Left    Right Hand Gross Grasp Functional    Left Hand Gross Grasp Functional    Right/Left Hip Right;Left    Right Hip Flexion 3/5    Right Hip ABduction 2+/5    Left Hip Flexion 2-/5    Left Hip ABduction 2/5    Right/Left Knee Right;Left    Right Knee Flexion 2-/5    Right Knee Extension 3+/5    Left Knee Flexion 3+/5    Left Knee Extension 4/5    Right/Left Ankle Right;Left    Right Ankle Dorsiflexion 1/5    Left Ankle Dorsiflexion 2-/5      Transfers   Transfers Sit to Stand;Stand to Sit    Sit to Stand 6: Modified independent (Device/Increase time);5: Supervision    Sit to Stand Details (indicate cue type and reason) with UE support    Stand to Sit 6: Modified independent (Device/Increase time);5: Supervision      Ambulation/Gait   Ambulation/Gait Yes    Ambulation/Gait Assistance 5: Supervision;4: Min guard    Ambulation/Gait Assistance Details Pt ambulates with step-through/step-to pattern as catches left toes at times with decreased left hip flexion. Genu recurvatum.    Ambulation Distance (Feet) 75 Feet    Assistive device Rolling walker   right AFO   Gait Pattern Step-to pattern;Step-through pattern;Decreased step length - left;Decreased dorsiflexion - left;Decreased hip/knee flexion - right;Decreased hip/knee flexion - left    Ambulation Surface Level;Indoor    Gait velocity  54.26 sec=0.68m/s or 0.60ft/ sec      Standardized Balance Assessment   Standardized Balance Assessment Berg Balance Test      Berg Balance Test   Sit to Stand Able to stand  independently using hands  Standing Unsupported Able to stand 2 minutes with supervision    Sitting with Back Unsupported but Feet Supported on Floor or Stool Able to sit safely and securely 2 minutes    Stand to Sit Controls descent by using hands    Transfers Needs one person to assist    Standing Unsupported with Eyes Closed Able to stand 10 seconds with supervision    Standing Unsupported with Feet Together Needs help to attain position but able to stand for 30 seconds with feet together    From Standing, Reach Forward with Outstretched Arm Reaches forward but needs supervision    From Standing Position, Pick up Object from Floor Able to pick up shoe, needs supervision    From Standing Position, Turn to Look Behind Over each Shoulder Needs supervision when turning    Turn 360 Degrees Needs assistance while turning    Standing Unsupported, Alternately Place Feet on Step/Stool Able to complete >2 steps/needs minimal assist    Standing Unsupported, One Foot in Front Needs help to step but can hold 15 seconds    Standing on One Leg Unable to try or needs assist to prevent fall    Total Score 25      RLE Tone   RLE Tone Mild      LLE Tone   LLE Tone Mild                      Objective measurements completed on examination: See above findings.               PT Education - 08/03/20 1455    Education Details Discussed PT plan of care. Explained to pt the benefit of using arms to assist as needed for safety and improved mobility. Suggested looking in to toe cap for left shoe but may need another AFO to help with clearance. Pt was also instructed to work on elevating legs when resting to try to help with swelling. Pt has some compression stockings that he may try.    Person(s) Educated  Patient    Methods Explanation    Comprehension Verbalized understanding            PT Short Term Goals - 08/03/20 1511      PT SHORT TERM GOAL #1   Title Patient will demonstrate independence with updated HEP for strengthening, balance and flexibility.    Time 4    Period Weeks    Status New    Target Date 09/02/20      PT SHORT TERM GOAL #2   Title Pt will increase speed from 0.24m/s (0.71ft/sec) to >0.60m/s for improved mobility.    Baseline 08/03/20 0.19m/s or 0.45ft/ sec,    Time 4    Period Weeks    Status New    Target Date 09/02/20      PT SHORT TERM GOAL #3   Title Pt will improve Berg from 25 to 31/56 or greater for improved balance.    Baseline 08/03/20 25/56    Time 4    Period Weeks    Status New    Target Date 09/02/20      PT SHORT TERM GOAL #4   Title Pt will ambulate > 200' with RW mod I over level surfaces inside for improved mobility.    Time 4    Period Weeks    Status New    Target Date 09/02/20      PT SHORT TERM GOAL #5  Title Pt will be further asssessed to determine if there is need for left AFO and/or toe cap.    Time 4    Period Weeks    Status New    Target Date 09/02/20             PT Long Term Goals - 08/03/20 1518      PT LONG TERM GOAL #1   Title Pt will ambulate >300' with bilateral loftstrand crutches for improved mobility on level paved or grass surfaces to be able to get to his buidling out back safely CGA.    Time 8    Period Weeks    Status New    Target Date 10/02/20      PT LONG TERM GOAL #2   Title Pt will increase gait speed to >0.60m/s  for improved gait safety.    Baseline 08/03/20 0.13m/s or 0.76ft/ sec    Time 8    Period Weeks    Status New    Target Date 10/02/20      PT LONG TERM GOAL #3   Title Pt will improve BERG to >/= 37/56 to decrease falls risk    Baseline 08/03/20 25/56    Time 8    Period Weeks    Status New    Target Date 10/02/20      PT LONG TERM GOAL #4   Title Pt will ambulate up/down 6  steps with rails mod I to safetly enter/exit home.    Time 8    Period Weeks    Status New    Target Date 10/02/20                  Plan - 08/03/20 1459    Clinical Impression Statement Pt is 50 y/o male with history of relapsing remitting MS returning to therapy to work on his strength, and mobility. Pt has decreased strength throughout BLE with lower leg and left hip being the weakest. Strength ranges from 1/5 to 3+/5 in BLE. Pt has increased spasticity in standing with extensor tone. He ambulates with RW with decreased left foot clearance at slow gait speed of 0.79m/s indicating decreased safety with household mobility. Pt fatigues quickly with activities. Pt is high fall risk based on Berg Balance score of 25/56. Pt will benefit from skilled PT to address strength, balance, flexibility and mobility deficits.    Personal Factors and Comorbidities Comorbidity 3+;Time since onset of injury/illness/exacerbation;Past/Current Experience    Comorbidities multiple falls, vitamin D insufficiency, Type 1 IDDM, diabetic retinopathy, hypothyroidism, HTN, hyperlipidemia, R tibia fracture, L ankle fracture with ORIF    Examination-Activity Limitations Carry;Locomotion Level;Stairs;Stand;Transfers;Squat    Examination-Participation Restrictions Community Activity;Driving;Yard Work    Merchant [employer] officer Evolving/Moderate complexity    Clinical Decision Making Moderate    Rehab Potential Good    PT Frequency 2x / week   plus eval   PT Duration 8 weeks    PT Treatment/Interventions ADLs/Self Care Home Management;Aquatic Therapy;Electrical Stimulation;DME Instruction;Gait training;Stair training;Functional mobility training;Therapeutic activities;Therapeutic exercise;Balance training;Neuromuscular re-education;Patient/family education;Orthotic Fit/Training;Passive range of motion;Energy conservation;Manual techniques    PT Next Visit Plan Review/update strengthening HEP from last fall to  see if we need to make some changes. Gait training with RW and possibly with loftstrand crutches. Pt reports he purchased bilateral loftstrands but has not used them. He may benefit from left AFO and/or toe cap to help with left foot clearance.    Consulted and Agree with Plan of Care Patient  Patient will benefit from skilled therapeutic intervention in order to improve the following deficits and impairments:  Abnormal gait,Decreased balance,Decreased endurance,Decreased coordination,Decreased range of motion,Decreased strength,Difficulty walking,Impaired tone,Postural dysfunction,Decreased activity tolerance,Decreased mobility,Impaired flexibility,Increased edema  Visit Diagnosis: Other abnormalities of gait and mobility  Muscle weakness (generalized)  Unsteadiness on feet  Repeated falls     Problem List Patient Active Problem List   Diagnosis Date Noted  . Thoracic outlet syndrome 05/03/2020  . Foreign body in conjunctival sac, right, initial encounter 03/28/2020  . Controlled type 1 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 ([place]) 03/24/2020  . Right epiretinal membrane 03/24/2020  . Retinal exudates and deposits 03/24/2020  . Recurrent erosion of left cornea 03/24/2020  . 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 Nuclear sclerotic cataract of both eyes 03/24/2020  . Gait disorder 03/23/2020  . Cough due to ACE inhibitor 03/03/2020  . Benign prostatic hyperplasia without lower urinary tract symptoms 03/01/2020  . Diabetic gastroparesis associated with type 1 diabetes mellitus ([place]) 03/01/2020  . Uncontrolled type 1 diabetes mellitus with hyperglycemia ([place]) 03/01/2020  . Gait disturbance 08/19/2019  . Acquired diplegia ([place]) 08/19/2019  . Vitamin D insufficiency 11/29/2016  . Hypogonadism in male 11/01/2016  . 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[place])   . Hypothyroidism   . Hypertension   . Abnormal liver  function tests 03/06/2013  . Type I (juvenile type) diabetes mellitus without mention of complication, uncontrolled 12/03/2007  . HYPERCHOLESTEROLEMIA 11/12/2007  . MULTIPLE SCLEROSIS 08/29/2007  . PROLIFERATIVE DIABETIC RETINOPATHY 08/29/2007    Electa Sniff, PT, DPT, NCS 08/03/2020, 3:22 PM  [facility] [address] [place], [place], [address] Phone: [phone]   Fax:  [phone]  Name: Johnathan Ross MRN: [id number] Date of Birth: 09-Oct-1970

## 2020-08-04 ENCOUNTER — Telehealth: Payer: Self-pay | Admitting: Endocrinology

## 2020-08-04 NOTE — Telephone Encounter (Signed)
PT requests that new pump rates from his last virtual visit please be sent to him via My Chart

## 2020-08-08 NOTE — Telephone Encounter (Signed)
Please advise below message 

## 2020-08-09 ENCOUNTER — Other Ambulatory Visit: Payer: Self-pay

## 2020-08-09 ENCOUNTER — Ambulatory Visit: Payer: Medicare Other

## 2020-08-09 DIAGNOSIS — R2689 Other abnormalities of gait and mobility: Secondary | ICD-10-CM | POA: Diagnosis not present

## 2020-08-09 DIAGNOSIS — R2681 Unsteadiness on feet: Secondary | ICD-10-CM | POA: Diagnosis not present

## 2020-08-09 DIAGNOSIS — M6281 Muscle weakness (generalized): Secondary | ICD-10-CM

## 2020-08-09 DIAGNOSIS — R296 Repeated falls: Secondary | ICD-10-CM | POA: Diagnosis not present

## 2020-08-09 NOTE — Therapy (Signed)
[facility] [address] [place], [place], [address] Phone: [phone]   Fax:  [phone]  Physical Therapy Treatment  Patient Details  Name: Johnathan Ross MRN: [id number] Date of Birth: 09/06/1970 Referring Provider (PT): Britt Bottom, MD   Encounter Date: 08/09/2020   PT End of Session - 08/09/20 0850    Visit Number 2    Number of Visits 17    Date for PT Re-Evaluation 11/01/20   60 day poc, 90 day cert   Authorization Type Medicare Part A and B; 10th visit PN    PT Start Time 0845    PT Stop Time 0930    PT Time Calculation (min) 45 min    Equipment Utilized During Treatment Gait belt    Activity Tolerance Patient tolerated treatment well    Behavior During Therapy [facility] for tasks assessed/performed           Past Medical History:  Diagnosis Date  . Hyperlipidemia   . Hypertension   . Hypogonadism in male 11/01/2016  . Hypothyroidism   . Multiple sclerosis ([place])   . Proliferative diabetic retinopathy(362.02)   . Type 1 diabetes mellitus ([place]) dx'd 1994  . Vitamin D insufficiency 11/29/2016    Past Surgical History:  Procedure Laterality Date  . EYE SURGERY Bilateral    "laser for diabetic retinopathy"  . FRACTURE SURGERY    . IR VENO/EXT/UNI LEFT  03/24/2020  . OPEN REDUCTION INTERNAL FIXATION (ORIF) TIBIA/FIBULA FRACTURE Right 10/30/2016  . ORIF ANKLE FRACTURE Left 2015  . ORIF TIBIA FRACTURE Right 10/30/2016   Procedure: OPEN REDUCTION INTERNAL FIXATION (ORIF) TIBIA FIBULA FRACTURE;  Surgeon: Altamese , MD;  Location: [facility];  Service: Orthopedics;  Laterality: Right;  . RETINAL LASER PROCEDURE Bilateral    "for diabetic retinopathy"    There were no vitals filed for this visit.   Subjective Assessment - 08/09/20 0850    Subjective Pt reports he is doing fine. No changes.    Pertinent History multiple falls, vitamin D insufficiency, Type 1 IDDM, diabetic retinopathy, hypothyroidism,  HTN, hyperlipidemia, R tibia fracture, L ankle fracture with ORIF    Patient Stated Goals Wants to get back to working on his walking and strength.    Currently in Pain? No/denies                             [facility] Adult PT Treatment/Exercise - 08/09/20 0850      Ambulation/Gait   Ambulation/Gait Yes    Ambulation/Gait Assistance 5: Supervision    Ambulation/Gait Assistance Details in/out of clinic with RW. Worked on reciprocal steps in step-through pattern. Pt has decreased left hip/knee flexion and DF with resulting times of catching left foot on ground at times.    Ambulation Distance (Feet) 75 Feet    Assistive device Rolling walker   right AFO   Gait Pattern Step-through pattern;Step-to pattern;Decreased step length - left;Decreased hip/knee flexion - right;Decreased hip/knee flexion - left;Decreased dorsiflexion - left;Left genu recurvatum    Ambulation Surface Level;Indoor      Exercises   Exercises Other Exercises    Other Exercises  PT reviewed medbridge HEP from last fall to review and update. See below for more information. PT also had pt perform quadruped hip flexion x 10 on each side. More dificulty on RLE with less motion.            Exercises Child's Pose Stretch - 1  x daily - 7 x weekly - 3 sets - 20 second hold Quadruped Hip Extension Kicks - 1 x daily - 5 x weekly - 5 reps - 4 sets Heel Sits - 1 x daily - 5 x weekly - 12 reps - 1 sets  -used 5# weights in both hands with cues to engage core Tall Kneeling Diagonal Lift with Medicine Ball - 1 x daily - 5 x weekly - 5-10 reps - 1 sets- used 5# weights Supine Thoracic Mobilization Towel Roll Vertical with Arm Stretch - 1 x daily - 5 x weekly - 1 sets - 2 minutes hold Supine Shoulder Horizontal Abduction with Resistance - 1 x daily - 7 x weekly - 1 sets - 10 reps- used 5# weights Supine Shoulder External Rotation with Dumbbell - 1 x daily - 7 x weekly - 2 sets - 10 reps- used 5# weights Marching Bridge  - 1 x daily - 7 x weekly - 1 sets - 5 reps Supine March - 1 x daily - 7 x weekly - 2 sets - 5 reps Clamshell - 1 x daily - 7 x weekly - 1 sets - 10 reps- verbal cues to prevent rolling Standing with Back Flat Against Wall - 1 x daily - 5 x weekly - 1 sets - 10 reps - 10 hold  Attempted the original isometric hip flexion in hooklying but pt is unable to hold leg against any resistance so removed. Also trialed sidelying hip abd with knee bent but pt only able to lift minimally and kicking in trunk some to help so removed.       PT Education - 08/09/20 1545    Education Details Reviewed and updated prior HEP.Discussed with pt the possibility of trialing left AFO to assist with ankle and knee control. Explained to pt that this would not make leg weaker, it would just help him to be able to move more easily and conserve energy.    Person(s) Educated Patient    Methods Explanation;Demonstration;Handout    Comprehension Verbalized understanding;Returned demonstration            PT Short Term Goals - 08/03/20 1511      PT SHORT TERM GOAL #1   Title Patient will demonstrate independence with updated HEP for strengthening, balance and flexibility.    Time 4    Period Weeks    Status New    Target Date 09/02/20      PT SHORT TERM GOAL #2   Title Pt will increase speed from 0.60m/s (0.31ft/sec) to >0.42m/s for improved mobility.    Baseline 08/03/20 0.48m/s or 0.110ft/ sec,    Time 4    Period Weeks    Status New    Target Date 09/02/20      PT SHORT TERM GOAL #3   Title Pt will improve Berg from 25 to 31/56 or greater for improved balance.    Baseline 08/03/20 25/56    Time 4    Period Weeks    Status New    Target Date 09/02/20      PT SHORT TERM GOAL #4   Title Pt will ambulate > 200' with RW mod I over level surfaces inside for improved mobility.    Time 4    Period Weeks    Status New    Target Date 09/02/20      PT SHORT TERM GOAL #5   Title Pt will be further asssessed to  determine if there is need  for left AFO and/or toe cap.    Time 4    Period Weeks    Status New    Target Date 09/02/20             PT Long Term Goals - 08/03/20 1518      PT LONG TERM GOAL #1   Title Pt will ambulate >300' with bilateral loftstrand crutches for improved mobility on level paved or grass surfaces to be able to get to his buidling out back safely CGA.    Time 8    Period Weeks    Status New    Target Date 10/02/20      PT LONG TERM GOAL #2   Title Pt will increase gait speed to >0.22m/s  for improved gait safety.    Baseline 08/03/20 0.69m/s or 0.25ft/ sec    Time 8    Period Weeks    Status New    Target Date 10/02/20      PT LONG TERM GOAL #3   Title Pt will improve BERG to >/= 37/56 to decrease falls risk    Baseline 08/03/20 25/56    Time 8    Period Weeks    Status New    Target Date 10/02/20      PT LONG TERM GOAL #4   Title Pt will ambulate up/down 6 steps with rails mod I to safetly enter/exit home.    Time 8    Period Weeks    Status New    Target Date 10/02/20                 Plan - 08/09/20 1546    Clinical Impression Statement PT focused on reviewing/updating prior HEP. Pt was given cues to focus on engaging core with activities more to help with control More dififculty with right hip flexion activities. Pt was not able to perform hooklying  isometric hip flexion so removed. Also added clamshell instead of sidelying hip abd with knee bent as pt had better form with this.    Personal Factors and Comorbidities Comorbidity 3+;Time since onset of injury/illness/exacerbation;Past/Current Experience    Comorbidities multiple falls, vitamin D insufficiency, Type 1 IDDM, diabetic retinopathy, hypothyroidism, HTN, hyperlipidemia, R tibia fracture, L ankle fracture with ORIF    Examination-Activity Limitations Carry;Locomotion Level;Stairs;Stand;Transfers;Squat    Examination-Participation Restrictions Community Activity;Driving;Yard Work     [employer] officer Evolving/Moderate complexity    Rehab Potential Good    PT Frequency 2x / week   plus eval   PT Duration 8 weeks    PT Treatment/Interventions ADLs/Self Care Home Management;Aquatic Therapy;Electrical Stimulation;DME Instruction;Gait training;Stair training;Functional mobility training;Therapeutic activities;Therapeutic exercise;Balance training;Neuromuscular re-education;Patient/family education;Orthotic Fit/Training;Passive range of motion;Energy conservation;Manual techniques    PT Next Visit Plan Continue with strengthening BLE and core exercises. Gait training with RW and possibly with loftstrand crutches. Pt reports he purchased bilateral loftstrands but has not used them. He may benefit from left AFO and/or toe cap to help with left foot clearance.    Consulted and Agree with Plan of Care Patient           Patient will benefit from skilled therapeutic intervention in order to improve the following deficits and impairments:  Abnormal gait,Decreased balance,Decreased endurance,Decreased coordination,Decreased range of motion,Decreased strength,Difficulty walking,Impaired tone,Postural dysfunction,Decreased activity tolerance,Decreased mobility,Impaired flexibility,Increased edema  Visit Diagnosis: Other abnormalities of gait and mobility  Muscle weakness (generalized)     Problem List Patient Active Problem List   Diagnosis Date Noted  . Thoracic outlet syndrome  05/03/2020  . Foreign body in conjunctival sac, right, initial encounter 03/28/2020  . Controlled type 1 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 ([place]) 03/24/2020  . Right epiretinal membrane 03/24/2020  . Retinal exudates and deposits 03/24/2020  . Recurrent erosion of left cornea 03/24/2020  . 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 Nuclear sclerotic cataract of both eyes 03/24/2020  . Gait disorder 03/23/2020  . Cough due to ACE inhibitor  03/03/2020  . Benign prostatic hyperplasia without lower urinary tract symptoms 03/01/2020  . Diabetic gastroparesis associated with type 1 diabetes mellitus ([place]) 03/01/2020  . Uncontrolled type 1 diabetes mellitus with hyperglycemia ([place]) 03/01/2020  . Gait disturbance 08/19/2019  . Acquired diplegia ([place]) 08/19/2019  . Vitamin D insufficiency 11/29/2016  . Hypogonadism in male 11/01/2016  . 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[place])   . Hypothyroidism   . Hypertension   . Abnormal liver function tests 03/06/2013  . Type I (juvenile type) diabetes mellitus without mention of complication, uncontrolled 12/03/2007  . HYPERCHOLESTEROLEMIA 11/12/2007  . MULTIPLE SCLEROSIS 08/29/2007  . PROLIFERATIVE DIABETIC RETINOPATHY 08/29/2007    Electa Sniff, PT, DPT, NCS 08/09/2020, 3:49 PM  [facility] [address] [place], [place], [address] Phone: [phone]   Fax:  [phone]  Name: Johnathan Ross MRN: [id number] Date of Birth: Feb 18, 1971

## 2020-08-09 NOTE — Patient Instructions (Signed)
Access Code: [email or web address] URL: [email or web address] Date: 08/09/2020 Prepared by: Cherly Anderson  Exercises Child's Pose Stretch - 1 x daily - 7 x weekly - 3 sets - 20 second hold Quadruped Hip Extension Kicks - 1 x daily - 5 x weekly - 5 reps - 4 sets Heel Sits - 1 x daily - 5 x weekly - 12 reps - 1 sets Tall Kneeling Diagonal Lift with Medicine Ball - 1 x daily - 5 x weekly - 5-10 reps - 1 sets Supine Thoracic Mobilization Towel Roll Vertical with Arm Stretch - 1 x daily - 5 x weekly - 1 sets - 2 minutes hold Supine Shoulder Horizontal Abduction with Resistance - 1 x daily - 7 x weekly - 1 sets - 10 reps Supine Shoulder External Rotation with Dumbbell - 1 x daily - 7 x weekly - 2 sets - 10 reps Marching Bridge - 1 x daily - 7 x weekly - 1 sets - 5 reps Supine March - 1 x daily - 7 x weekly - 2 sets - 5 reps Clamshell - 1 x daily - 7 x weekly - 1 sets - 10 reps Standing with Back Flat Against Wall - 1 x daily - 5 x weekly - 1 sets - 10 reps - 10 hold

## 2020-08-11 ENCOUNTER — Ambulatory Visit: Payer: Medicare Other | Admitting: Physical Therapy

## 2020-08-11 ENCOUNTER — Encounter: Payer: Self-pay | Admitting: Physical Therapy

## 2020-08-11 ENCOUNTER — Other Ambulatory Visit: Payer: Self-pay

## 2020-08-11 DIAGNOSIS — R2689 Other abnormalities of gait and mobility: Secondary | ICD-10-CM | POA: Diagnosis not present

## 2020-08-11 DIAGNOSIS — M6281 Muscle weakness (generalized): Secondary | ICD-10-CM

## 2020-08-11 DIAGNOSIS — R296 Repeated falls: Secondary | ICD-10-CM | POA: Diagnosis not present

## 2020-08-11 DIAGNOSIS — R2681 Unsteadiness on feet: Secondary | ICD-10-CM | POA: Diagnosis not present

## 2020-08-11 NOTE — Therapy (Signed)
[facility] [address] [place], [place], [address] Phone: [phone]   Fax:  [phone]  Physical Therapy Treatment  Patient Details  Name: Johnathan Ross MRN: [id number] Date of Birth: 1970-09-29 Referring Provider (PT): Britt Bottom, MD   Encounter Date: 08/11/2020   PT End of Session - 08/11/20 1220    Visit Number 3    Number of Visits 17    Date for PT Re-Evaluation 11/01/20   60 day poc, 90 day cert   Authorization Type Medicare Part A and B; 10th visit PN    PT Start Time 0850    PT Stop Time 0940    PT Time Calculation (min) 50 min    Equipment Utilized During Treatment Gait belt    Activity Tolerance Patient tolerated treatment well    Behavior During Therapy [facility] for tasks assessed/performed           Past Medical History:  Diagnosis Date  . Hyperlipidemia   . Hypertension   . Hypogonadism in male 11/01/2016  . Hypothyroidism   . Multiple sclerosis ([place])   . Proliferative diabetic retinopathy(362.02)   . Type 1 diabetes mellitus ([place]) dx'd 1994  . Vitamin D insufficiency 11/29/2016    Past Surgical History:  Procedure Laterality Date  . EYE SURGERY Bilateral    "laser for diabetic retinopathy"  . FRACTURE SURGERY    . IR VENO/EXT/UNI LEFT  03/24/2020  . OPEN REDUCTION INTERNAL FIXATION (ORIF) TIBIA/FIBULA FRACTURE Right 10/30/2016  . ORIF ANKLE FRACTURE Left 2015  . ORIF TIBIA FRACTURE Right 10/30/2016   Procedure: OPEN REDUCTION INTERNAL FIXATION (ORIF) TIBIA FIBULA FRACTURE;  Surgeon: Altamese Dunmore, MD;  Location: [facility];  Service: Orthopedics;  Laterality: Right;  . RETINAL LASER PROCEDURE Bilateral    "for diabetic retinopathy"    There were no vitals filed for this visit.   Subjective Assessment - 08/11/20 0906    Subjective Pt reports he is doing well.  He has noticed that his ankle swelling has gone down and is more conscious about his extensor tone after discussing it with the  therapist last session.  He also says his rib has been more achey than usual but he has not done anything specifically to provoke it.    Pertinent History multiple falls, vitamin D insufficiency, Type 1 IDDM, diabetic retinopathy, hypothyroidism, HTN, hyperlipidemia, R tibia fracture, L ankle fracture with ORIF    Patient Stated Goals Wants to get back to working on his walking and strength.    Currently in Pain? No/denies                             [facility] Adult PT Treatment/Exercise - 08/11/20 0947      Exercises   Exercises Ankle      Ankle Exercises: Stretches   Soleus Stretch 2 reps;30 seconds   seated, foot on 4 in box, and gentley pressing down and trying to drive knee forward   Soleus Stretch Limitations Pt unable to conact heel with floor on R.  Verbal cues and tactile cues given to apply pressure at knee to gently get a bigger stretch    Gastroc Stretch 2 reps;30 seconds   seated, foot elevated 4 in box, straight leg with strap underneath foot.  More forward trunk lean produced a better stretch for pt     Ankle Exercises: Seated   Other Seated Ankle Exercises Banded ankle PF with black  theraband x20 each, inversion/eversion with pillowcase under foot no external resistance x20 each, ankle DF isometric hold 10x3sec each   Weakness noted most in R ankle DF and bilateral eversion movement.  During PF exercise excessive eversion motion noted.  Verbal and tactile cueing given to produce strict PF motion.                 PT Education - 08/11/20 1219    Education Details New ankle stretching and strengthening exercises for HEP, benefit of L AFO for energy conservation and improvements in gait pattern, benefit of T-strap for R AFO for better gait mechanics, potential for toe cap on L shoe    Person(s) Educated Patient    Methods Explanation;Handout    Comprehension Verbalized understanding          Access Code: [email or web address] URL:  [email or web address] Date: 08/11/2020 Prepared by: Misty Stanley  Exercises Child's Pose Stretch - 1 x daily - 7 x weekly - 3 sets - 20 second hold Quadruped Hip Extension Kicks - 1 x daily - 5 x weekly - 5 reps - 4 sets Heel Sits - 1 x daily - 5 x weekly - 12 reps - 1 sets Tall Kneeling Diagonal Lift with Medicine Ball - 1 x daily - 5 x weekly - 5-10 reps - 1 sets Supine Thoracic Mobilization Towel Roll Vertical with Arm Stretch - 1 x daily - 5 x weekly - 1 sets - 2 minutes hold Supine Shoulder Horizontal Abduction with Resistance - 1 x daily - 7 x weekly - 1 sets - 10 reps Supine Shoulder External Rotation with Dumbbell - 1 x daily - 7 x weekly - 2 sets - 10 reps Marching Bridge - 1 x daily - 7 x weekly - 1 sets - 5 reps Supine March - 1 x daily - 7 x weekly - 2 sets - 5 reps Clamshell - 1 x daily - 7 x weekly - 1 sets - 10 reps Standing with Back Flat Against Wall - 1 x daily - 5 x weekly - 1 sets - 10 reps - 10 hold Seated Gastroc Stretch with Strap - 1 x daily - 7 x weekly - 2 sets - 60 second hold Seated Soleus Stretch - 1 x daily - 7 x weekly - 2 sets - 60 second hold Seated Ankle Plantarflexion with Resistance - 1 x daily - 7 x weekly - 2 sets - 20 reps Ankle Inversion Eversion Towel Slide - 1 x daily - 7 x weekly - 2 sets - 20 reps Seated Ankle Dorsiflexion AROM - 1 x daily - 7 x weekly - 2 sets - 20 reps - 5-6 second hold     PT Short Term Goals - 08/03/20 1511      PT SHORT TERM GOAL #1   Title Patient will demonstrate independence with updated HEP for strengthening, balance and flexibility.    Time 4    Period Weeks    Status New    Target Date 09/02/20      PT SHORT TERM GOAL #2   Title Pt will increase speed from 0.42m/s (0.59ft/sec) to >0.60m/s for improved mobility.    Baseline 08/03/20 0.42m/s or 0.62ft/ sec,    Time 4    Period Weeks    Status New    Target Date 09/02/20      PT SHORT TERM GOAL #3   Title Pt will improve Berg from 25 to 31/56 or  greater for improved balance.  Baseline 08/03/20 25/56    Time 4    Period Weeks    Status New    Target Date 09/02/20      PT SHORT TERM GOAL #4   Title Pt will ambulate > 200' with RW mod I over level surfaces inside for improved mobility.    Time 4    Period Weeks    Status New    Target Date 09/02/20      PT SHORT TERM GOAL #5   Title Pt will be further asssessed to determine if there is need for left AFO and/or toe cap.    Time 4    Period Weeks    Status New    Target Date 09/02/20             PT Long Term Goals - 08/03/20 1518      PT LONG TERM GOAL #1   Title Pt will ambulate >300' with bilateral loftstrand crutches for improved mobility on level paved or grass surfaces to be able to get to his buidling out back safely CGA.    Time 8    Period Weeks    Status New    Target Date 10/02/20      PT LONG TERM GOAL #2   Title Pt will increase gait speed to >0.48m/s  for improved gait safety.    Baseline 08/03/20 0.54m/s or 0.84ft/ sec    Time 8    Period Weeks    Status New    Target Date 10/02/20      PT LONG TERM GOAL #3   Title Pt will improve BERG to >/= 37/56 to decrease falls risk    Baseline 08/03/20 25/56    Time 8    Period Weeks    Status New    Target Date 10/02/20      PT LONG TERM GOAL #4   Title Pt will ambulate up/down 6 steps with rails mod I to safetly enter/exit home.    Time 8    Period Weeks    Status New    Target Date 10/02/20                 Plan - 08/11/20 1220    Clinical Impression Statement Therapy today focused on developing exercises to add to his HEP to target his ankle weakness and tightness.  Pt tolerated all exercises well but displayed significant weakness with ankle DF R>L and ankle eversion.  Encouraged pt to try a t-strap on R AFO to better improve gait mecahnics, limit levels of fatigue and make him more functional with ADLs.  Discussed benefit of a L AFO and a L toe cap to address limitations in gait as well.     Personal Factors and Comorbidities Comorbidity 3+;Time since onset of injury/illness/exacerbation;Past/Current Experience    Comorbidities multiple falls, vitamin D insufficiency, Type 1 IDDM, diabetic retinopathy, hypothyroidism, HTN, hyperlipidemia, R tibia fracture, L ankle fracture with ORIF    Examination-Activity Limitations Carry;Locomotion Level;Stairs;Stand;Transfers;Squat    Examination-Participation Restrictions Community Activity;Driving;Yard Work    [employer]chant [employer] officer Evolving/Moderate complexity    Rehab Potential Good    PT Frequency 2x / week   plus eval   PT Duration 8 weeks    PT Treatment/Interventions ADLs/Self Care Home Management;Aquatic Therapy;Electrical Stimulation;DME Instruction;Gait training;Stair training;Functional mobility training;Therapeutic activities;Therapeutic exercise;Balance training;Neuromuscular re-education;Patient/family education;Orthotic Fit/Training;Passive range of motion;Energy conservation;Manual techniques    PT Next Visit Plan Trial L AFO and trial T strap to control inversion/supination.  Continue  with strengthening BLE and core exercises. Gait training with RW and possibly with loftstrand crutches. Pt reports he purchased bilateral loftstrands but has not used them. He may benefit from left AFO and/or toe cap to help with left foot clearance.    Consulted and Agree with Plan of Care Patient           Patient will benefit from skilled therapeutic intervention in order to improve the following deficits and impairments:  Abnormal gait,Decreased balance,Decreased endurance,Decreased coordination,Decreased range of motion,Decreased strength,Difficulty walking,Impaired tone,Postural dysfunction,Decreased activity tolerance,Decreased mobility,Impaired flexibility,Increased edema  Visit Diagnosis: Other abnormalities of gait and mobility  Muscle weakness     Problem List Patient Active Problem List   Diagnosis Date Noted  .  Thoracic outlet syndrome 05/03/2020  . Foreign body in conjunctival sac, right, initial encounter 03/28/2020  . Controlled type 1 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 ([place]) 03/24/2020  . Right epiretinal membrane 03/24/2020  . Retinal exudates and deposits 03/24/2020  . Recurrent erosion of left cornea 03/24/2020  . 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 Nuclear sclerotic cataract of both eyes 03/24/2020  . Gait disorder 03/23/2020  . Cough due to ACE inhibitor 03/03/2020  . Benign prostatic hyperplasia without lower urinary tract symptoms 03/01/2020  . Diabetic gastroparesis associated with type 1 diabetes mellitus ([place]) 03/01/2020  . Uncontrolled type 1 diabetes mellitus with hyperglycemia ([place]) 03/01/2020  . Gait disturbance 08/19/2019  . Acquired diplegia ([place]) 08/19/2019  . Vitamin D insufficiency 11/29/2016  . Hypogonadism in male 11/01/2016  . 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[place])   . Hypothyroidism   . Hypertension   . Abnormal liver function tests 03/06/2013  . Type I (juvenile type) diabetes mellitus without mention of complication, uncontrolled 12/03/2007  . HYPERCHOLESTEROLEMIA 11/12/2007  . MULTIPLE SCLEROSIS 08/29/2007  . PROLIFERATIVE DIABETIC RETINOPATHY 08/29/2007    Yetta Numbers, SPT 08/11/2020, 2:31 PM  [facility] [address] [place], [place], [address] Phone: [phone]   Fax:  [phone]  Name: Johnathan Ross MRN: [id number] Date of Birth: [birth year]

## 2020-08-11 NOTE — Patient Instructions (Signed)
Access Code: [email or web address] URL: [email or web address] Date: 08/11/2020 Prepared by: Misty Stanley  Exercises Child's Pose Stretch - 1 x daily - 7 x weekly - 3 sets - 20 second hold Quadruped Hip Extension Kicks - 1 x daily - 5 x weekly - 5 reps - 4 sets Heel Sits - 1 x daily - 5 x weekly - 12 reps - 1 sets Tall Kneeling Diagonal Lift with Medicine Ball - 1 x daily - 5 x weekly - 5-10 reps - 1 sets Supine Thoracic Mobilization Towel Roll Vertical with Arm Stretch - 1 x daily - 5 x weekly - 1 sets - 2 minutes hold Supine Shoulder Horizontal Abduction with Resistance - 1 x daily - 7 x weekly - 1 sets - 10 reps Supine Shoulder External Rotation with Dumbbell - 1 x daily - 7 x weekly - 2 sets - 10 reps Marching [place] - 1 x daily - 7 x weekly - 1 sets - 5 reps Supine March - 1 x daily - 7 x weekly - 2 sets - 5 reps Clamshell - 1 x daily - 7 x weekly - 1 sets - 10 reps Standing with Back Flat Against Wall - 1 x daily - 5 x weekly - 1 sets - 10 reps - 10 hold Seated Gastroc Stretch with Strap - 1 x daily - 7 x weekly - 2 sets - 60 second hold Seated Soleus Stretch - 1 x daily - 7 x weekly - 2 sets - 60 second hold Seated Ankle Plantarflexion with Resistance - 1 x daily - 7 x weekly - 2 sets - 20 reps Ankle Inversion Eversion Towel Slide - 1 x daily - 7 x weekly - 2 sets - 20 reps Seated Ankle Dorsiflexion AROM - 1 x daily - 7 x weekly - 2 sets - 20 reps - 5-6 second hold

## 2020-08-18 ENCOUNTER — Other Ambulatory Visit: Payer: Self-pay

## 2020-08-18 ENCOUNTER — Ambulatory Visit: Payer: Medicare Other | Admitting: Physical Therapy

## 2020-08-18 ENCOUNTER — Encounter: Payer: Self-pay | Admitting: Physical Therapy

## 2020-08-18 DIAGNOSIS — R2689 Other abnormalities of gait and mobility: Secondary | ICD-10-CM

## 2020-08-18 DIAGNOSIS — R2681 Unsteadiness on feet: Secondary | ICD-10-CM

## 2020-08-18 DIAGNOSIS — R296 Repeated falls: Secondary | ICD-10-CM | POA: Diagnosis not present

## 2020-08-18 DIAGNOSIS — M6281 Muscle weakness (generalized): Secondary | ICD-10-CM

## 2020-08-19 NOTE — Therapy (Signed)
[facility] [address] [place], [place], [address] Phone: [phone]   Fax:  [phone]  Physical Therapy Treatment  Patient Details  Name: Johnathan Ross MRN: [id number] Date of Birth: Oct 29, 1970 Referring Provider (PT): Britt Bottom, MD   Encounter Date: 08/18/2020   PT End of Session - 08/18/20 0853    Visit Number 4    Number of Visits 17    Date for PT Re-Evaluation 11/01/20   60 day poc, 90 day cert   Authorization Type Medicare Part A and B; 10th visit PN    PT Start Time 0848    PT Stop Time 0930    PT Time Calculation (min) 42 min    Equipment Utilized During Treatment Other (comment)   left AFO   Activity Tolerance Patient tolerated treatment well    Behavior During Therapy [facility] for tasks assessed/performed           Past Medical History:  Diagnosis Date  . Hyperlipidemia   . Hypertension   . Hypogonadism in male 11/01/2016  . Hypothyroidism   . Multiple sclerosis ([place])   . Proliferative diabetic retinopathy(362.02)   . Type 1 diabetes mellitus ([place]) dx'd 1994  . Vitamin D insufficiency 11/29/2016    Past Surgical History:  Procedure Laterality Date  . EYE SURGERY Bilateral    "laser for diabetic retinopathy"  . FRACTURE SURGERY    . IR VENO/EXT/UNI LEFT  03/24/2020  . OPEN REDUCTION INTERNAL FIXATION (ORIF) TIBIA/FIBULA FRACTURE Right 10/30/2016  . ORIF ANKLE FRACTURE Left 2015  . ORIF TIBIA FRACTURE Right 10/30/2016   Procedure: OPEN REDUCTION INTERNAL FIXATION (ORIF) TIBIA FIBULA FRACTURE;  Surgeon: Altamese Gravity, MD;  Location: [facility];  Service: Orthopedics;  Laterality: Right;  . RETINAL LASER PROCEDURE Bilateral    "for diabetic retinopathy"    There were no vitals filed for this visit.   Subjective Assessment - 08/18/20 0852    Subjective No new complaints. No falls or pain to report. Rib pain has resolved. Still with swelling in left ankle>right ankle.    Pertinent History  multiple falls, vitamin D insufficiency, Type 1 IDDM, diabetic retinopathy, hypothyroidism, HTN, hyperlipidemia, R tibia fracture, L ankle fracture with ORIF    Patient Stated Goals Wants to get back to working on his walking and strength.    Currently in Pain? No/denies                 [facility] Adult PT Treatment/Exercise - 08/18/20 0854      Transfers   Transfers Sit to Stand;Stand to Sit    Sit to Stand 6: Modified independent (Device/Increase time);5: Supervision    Stand to Sit 6: Modified independent (Device/Increase time);5: Supervision      Ambulation/Gait   Ambulation/Gait Yes    Ambulation/Gait Assistance 5: Supervision    Ambulation/Gait Assistance Details trial of T-strap to right LE on lateral side pulling medially to offer support to ankle. minimal improvement noted due to location of brace strut. on second gait rep had donned left Posterior [place] brace with improved hip/knee flexion noted and decreased extensor tone activation with stance. left toes occasionally catching, so for final gait rep added simulated toe cap to left shoe. No issues noted with swing phase with decreased extensor tone activation noted. Pt remains unsure on use of left AFO. Agreed for therapy to get an MD order at this time. Will benefit from continued use in therapy with gait training.    Ambulation Distance (  Feet) 65 Feet   x1, 30 x1, 65 x1   Assistive device Rolling walker;Other (Comment)   right AFO   Gait Pattern Step-through pattern;Step-to pattern;Decreased step length - left;Decreased hip/knee flexion - right;Decreased hip/knee flexion - left;Decreased dorsiflexion - left;Left genu recurvatum    Ambulation Surface Level;Indoor      Ankle Exercises: Stretches   Soleus Stretch 2 reps;30 seconds;Limitations    Soleus Stretch Limitations toe/forefoot on 4 inch box with over pressure at knee/ankle pressing heel donw toward floor for strecing.      Ankle Exercises: Seated   Other Seated Ankle  Exercises black theraband assited DF/resisted PF for 20 reps with assist to keep foot in neutral through out the movement; with foot on pillow case working on inversion/evererions wtih assist at knee to maintain stability.                    PT Short Term Goals - 08/03/20 1511      PT SHORT TERM GOAL #1   Title Patient will demonstrate independence with updated HEP for strengthening, balance and flexibility.    Time 4    Period Weeks    Status New    Target Date 09/02/20      PT SHORT TERM GOAL #2   Title Pt will increase speed from 0.40m/s (0.73ft/sec) to >0.8m/s for improved mobility.    Baseline 08/03/20 0.34m/s or 0.49ft/ sec,    Time 4    Period Weeks    Status New    Target Date 09/02/20      PT SHORT TERM GOAL #3   Title Pt will improve Berg from 25 to 31/56 or greater for improved balance.    Baseline 08/03/20 25/56    Time 4    Period Weeks    Status New    Target Date 09/02/20      PT SHORT TERM GOAL #4   Title Pt will ambulate > 200' with RW mod I over level surfaces inside for improved mobility.    Time 4    Period Weeks    Status New    Target Date 09/02/20      PT SHORT TERM GOAL #5   Title Pt will be further asssessed to determine if there is need for left AFO and/or toe cap.    Time 4    Period Weeks    Status New    Target Date 09/02/20             PT Long Term Goals - 08/03/20 1518      PT LONG TERM GOAL #1   Title Pt will ambulate >300' with bilateral loftstrand crutches for improved mobility on level paved or grass surfaces to be able to get to his buidling out back safely CGA.    Time 8    Period Weeks    Status New    Target Date 10/02/20      PT LONG TERM GOAL #2   Title Pt will increase gait speed to >0.72m/s  for improved gait safety.    Baseline 08/03/20 0.26m/s or 0.37ft/ sec    Time 8    Period Weeks    Status New    Target Date 10/02/20      PT LONG TERM GOAL #3   Title Pt will improve BERG to >/= 37/56 to decrease  falls risk    Baseline 08/03/20 25/56    Time 8    Period Weeks  Status New    Target Date 10/02/20      PT LONG TERM GOAL #4   Title Pt will ambulate up/down 6 steps with rails mod I to safetly enter/exit home.    Time 8    Period Weeks    Status New    Target Date 10/02/20                 Plan - 08/18/20 0854    Clinical Impression Statement Today's skilled session focused on review of ankle ex's added to HEP at last session with no issues noted or reported. The remainder of the session focused on trial of T-strap to right ankle, left AFO and left simulated toe cap. Pt with significant improvement of extensor tone on left LE with use of AFO. Will need continued encouragement/education on how use of this brace will help his gait/balance and will benefit from continued use in sessions. The pt is progressing toward goals and should benefit from continued PT to progress toward unmet goals.    Personal Factors and Comorbidities Comorbidity 3+;Time since onset of injury/illness/exacerbation;Past/Current Experience    Comorbidities multiple falls, vitamin D insufficiency, Type 1 IDDM, diabetic retinopathy, hypothyroidism, HTN, hyperlipidemia, R tibia fracture, L ankle fracture with ORIF    Examination-Activity Limitations Carry;Locomotion Level;Stairs;Stand;Transfers;Squat    Examination-Participation Restrictions Community Activity;Driving;Yard Work    [employer] officer Evolving/Moderate complexity    Rehab Potential Good    PT Frequency 2x / week   plus eval   PT Duration 8 weeks    PT Treatment/Interventions ADLs/Self Care Home Management;Aquatic Therapy;Electrical Stimulation;DME Instruction;Gait training;Stair training;Functional mobility training;Therapeutic activities;Therapeutic exercise;Balance training;Neuromuscular re-education;Patient/family education;Orthotic Fit/Training;Passive range of motion;Energy conservation;Manual techniques    PT Next Visit Plan  continue with use of left posterior AFO and simulated toe cap. Progress toward loftstrand crutches as able (he has them, has not used them). continue to work on ankle motions/strengthening, hip/core strengthening and standing balance toward LTGs.    Consulted and Agree with Plan of Care Patient           Patient will benefit from skilled therapeutic intervention in order to improve the following deficits and impairments:  Abnormal gait,Decreased balance,Decreased endurance,Decreased coordination,Decreased range of motion,Decreased strength,Difficulty walking,Impaired tone,Postural dysfunction,Decreased activity tolerance,Decreased mobility,Impaired flexibility,Increased edema  Visit Diagnosis: Other abnormalities of gait and mobility  Muscle weakness (generalized)  Unsteadiness on feet  Repeated falls     Problem List Patient Active Problem List   Diagnosis Date Noted  . Thoracic outlet syndrome 05/03/2020  . Foreign body in conjunctival sac, right, initial encounter 03/28/2020  . Controlled type 1 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 ([place]) 03/24/2020  . Right epiretinal membrane 03/24/2020  . Retinal exudates and deposits 03/24/2020  . Recurrent erosion of left cornea 03/24/2020  . 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 Nuclear sclerotic cataract of both eyes 03/24/2020  . Gait disorder 03/23/2020  . Cough due to ACE inhibitor 03/03/2020  . Benign prostatic hyperplasia without lower urinary tract symptoms 03/01/2020  . Diabetic gastroparesis associated with type 1 diabetes mellitus ([place]) 03/01/2020  . Uncontrolled type 1 diabetes mellitus with hyperglycemia ([place]) 03/01/2020  . Gait disturbance 08/19/2019  . Acquired diplegia ([place]) 08/19/2019  . Vitamin D insufficiency 11/29/2016  . Hypogonadism in male 11/01/2016  . 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[place])   . Hypothyroidism   . Hypertension   . Abnormal liver  function tests 03/06/2013  . Type I (juvenile type) diabetes mellitus without mention  of complication, uncontrolled 12/03/2007  . HYPERCHOLESTEROLEMIA 11/12/2007  . MULTIPLE SCLEROSIS 08/29/2007  . PROLIFERATIVE DIABETIC RETINOPATHY 08/29/2007    Willow Ora, PTA, [facility] [address], [place], [place] [address] [phone] 08/19/20, 11:28 AM   Name: Johnathan Ross MRN: [id number] Date of Birth: Apr 11, 1971

## 2020-08-23 ENCOUNTER — Other Ambulatory Visit: Payer: Self-pay

## 2020-08-23 ENCOUNTER — Ambulatory Visit: Payer: Medicare Other | Admitting: Physical Therapy

## 2020-08-23 ENCOUNTER — Encounter: Payer: Self-pay | Admitting: Physical Therapy

## 2020-08-23 DIAGNOSIS — R2689 Other abnormalities of gait and mobility: Secondary | ICD-10-CM

## 2020-08-23 DIAGNOSIS — M6281 Muscle weakness (generalized): Secondary | ICD-10-CM | POA: Diagnosis not present

## 2020-08-23 DIAGNOSIS — R296 Repeated falls: Secondary | ICD-10-CM

## 2020-08-23 DIAGNOSIS — R2681 Unsteadiness on feet: Secondary | ICD-10-CM

## 2020-08-23 NOTE — Therapy (Signed)
[facility] [address] [place], [place], [address] Phone: [phone]   Fax:  [phone]  Physical Therapy Treatment  Patient Details  Name: Johnathan Ross MRN: [id number] Date of Birth: [birth year] Referring Provider (PT): Felecia Shelling, Nanine Means, MD   Encounter Date: 08/23/2020   PT End of Session - 08/23/20 1134    Visit Number 5    Number of Visits 17    Date for PT Re-Evaluation 11/01/20   60 day poc, 90 day cert   Authorization Type Medicare Part A and B; 10th visit PN    PT Start Time 0849    PT Stop Time 0930    PT Time Calculation (min) 41 min    Equipment Utilized During Treatment Other (comment)   left AFO   Activity Tolerance Patient tolerated treatment well    Behavior During Therapy [facility] for tasks assessed/performed           Past Medical History:  Diagnosis Date  . Hyperlipidemia   . Hypertension   . Hypogonadism in male 11/01/2016  . Hypothyroidism   . Multiple sclerosis ([place])   . Proliferative diabetic retinopathy(362.02)   . Type 1 diabetes mellitus ([place]) dx'd 1994  . Vitamin D insufficiency 11/29/2016    Past Surgical History:  Procedure Laterality Date  . EYE SURGERY Bilateral    "laser for diabetic retinopathy"  . FRACTURE SURGERY    . IR VENO/EXT/UNI LEFT  03/24/2020  . OPEN REDUCTION INTERNAL FIXATION (ORIF) TIBIA/FIBULA FRACTURE Right 10/30/2016  . ORIF ANKLE FRACTURE Left 2015  . ORIF TIBIA FRACTURE Right 10/30/2016   Procedure: OPEN REDUCTION INTERNAL FIXATION (ORIF) TIBIA FIBULA FRACTURE;  Surgeon: Altamese , MD;  Location: [facility];  Service: Orthopedics;  Laterality: Right;  . RETINAL LASER PROCEDURE Bilateral    "for diabetic retinopathy"    There were no vitals filed for this visit.   Subjective Assessment - 08/23/20 0852    Subjective No new complaints. No falls or pain to report. States he is doing the clamshells at home for about 50 reps with no fatigue or issues.     Pertinent History multiple falls, vitamin D insufficiency, Type 1 IDDM, diabetic retinopathy, hypothyroidism, HTN, hyperlipidemia, R tibia fracture, L ankle fracture with ORIF    Patient Stated Goals Wants to get back to working on his walking and strength.    Currently in Pain? No/denies                [facility] Adult PT Treatment/Exercise - 08/23/20 0854      Transfers   Transfers Sit to Stand;Stand to Sit    Sit to Stand 6: Modified independent (Device/Increase time);5: Supervision    Stand to Sit 6: Modified independent (Device/Increase time);5: Supervision      Ambulation/Gait   Ambulation/Gait Yes    Ambulation/Gait Assistance 5: Supervision    Ambulation/Gait Assistance Details into/out of gym. pt needing to stop x2 each way due to tone. no balance loss.    Assistive device Rolling walker;Other (Comment)   right AFO   Gait Pattern Step-through pattern;Step-to pattern;Decreased step length - left;Decreased hip/knee flexion - right;Decreased hip/knee flexion - left;Decreased dorsiflexion - left;Left genu recurvatum    Ambulation Surface Level;Indoor      Exercises   Exercises Other Exercises    Other Exercises  reviewed 2 ex's from HEP that pt was having issues with: clamshells- cues needed to keep hip forward and for controlled lowering of knee. added 5 sec holds as  well. pt performed 2 sets of 10 reps. in quadruped pt has issue with arms giving while lifiting LE's out and reports fatigue after a few reps. modified to have a small pball (pt has one) under abdomen, not holding for 5 sec's and to decrease to 3 reps, rest, and then try 3 more reps. Pt verbalized understanding. In session for strengthing: in prone- passive quad stretching for 30 sec's x 3 reps on each side. hamstring curls x 10 reps each side, then glut kick backs for 10 reps each side. min AA needed for form and technique with increased assist needed on right side. in supine- passive hamstring stretching for 30 sec's x 2  reps each side with left>right tightness noted.                    PT Short Term Goals - 08/03/20 1511      PT SHORT TERM GOAL #1   Title Patient will demonstrate independence with updated HEP for strengthening, balance and flexibility.    Time 4    Period Weeks    Status New    Target Date 09/02/20      PT SHORT TERM GOAL #2   Title Pt will increase speed from 0.50m/s (0.2ft/sec) to >0.49m/s for improved mobility.    Baseline 08/03/20 0.42m/s or 0.110ft/ sec,    Time 4    Period Weeks    Status New    Target Date 09/02/20      PT SHORT TERM GOAL #3   Title Pt will improve Berg from 25 to 31/56 or greater for improved balance.    Baseline 08/03/20 25/56    Time 4    Period Weeks    Status New    Target Date 09/02/20      PT SHORT TERM GOAL #4   Title Pt will ambulate > 200' with RW mod I over level surfaces inside for improved mobility.    Time 4    Period Weeks    Status New    Target Date 09/02/20      PT SHORT TERM GOAL #5   Title Pt will be further asssessed to determine if there is need for left AFO and/or toe cap.    Time 4    Period Weeks    Status New    Target Date 09/02/20             PT Long Term Goals - 08/03/20 1518      PT LONG TERM GOAL #1   Title Pt will ambulate >300' with bilateral loftstrand crutches for improved mobility on level paved or grass surfaces to be able to get to his buidling out back safely CGA.    Time 8    Period Weeks    Status New    Target Date 10/02/20      PT LONG TERM GOAL #2   Title Pt will increase gait speed to >0.40m/s  for improved gait safety.    Baseline 08/03/20 0.57m/s or 0.21ft/ sec    Time 8    Period Weeks    Status New    Target Date 10/02/20      PT LONG TERM GOAL #3   Title Pt will improve BERG to >/= 37/56 to decrease falls risk    Baseline 08/03/20 25/56    Time 8    Period Weeks    Status New    Target Date 10/02/20      PT  LONG TERM GOAL #4   Title Pt will ambulate up/down 6 steps with  rails mod I to safetly enter/exit home.    Time 8    Period Weeks    Status New    Target Date 10/02/20                 Plan - 08/23/20 1135    Clinical Impression Statement Today's skilled session initially focused on review of ex's pt reported having issues with at home. Modifcations made with pt reporting no issues with performance in session. Remainder of session continued to focus on LE stretching/strengthening with no further issues noted or reported by patient. The pt is progressing toward goals and should benefit from continued PT to progress toward unmet goals.    Personal Factors and Comorbidities Comorbidity 3+;Time since onset of injury/illness/exacerbation;Past/Current Experience    Comorbidities multiple falls, vitamin D insufficiency, Type 1 IDDM, diabetic retinopathy, hypothyroidism, HTN, hyperlipidemia, R tibia fracture, L ankle fracture with ORIF    Examination-Activity Limitations Carry;Locomotion Level;Stairs;Stand;Transfers;Squat    Examination-Participation Restrictions Community Activity;Driving;Yard Work    [employer] officer Evolving/Moderate complexity    Rehab Potential Good    PT Frequency 2x / week   plus eval   PT Duration 8 weeks    PT Treatment/Interventions ADLs/Self Care Home Management;Aquatic Therapy;Electrical Stimulation;DME Instruction;Gait training;Stair training;Functional mobility training;Therapeutic activities;Therapeutic exercise;Balance training;Neuromuscular re-education;Patient/family education;Orthotic Fit/Training;Passive range of motion;Energy conservation;Manual techniques    PT Next Visit Plan continue with use of left posterior AFO and simulated toe cap. Progress toward loftstrand crutches as able (he has them, has not used them). continue to work on ankle motions/strengthening, hip/core strengthening and standing balance toward LTGs.    Consulted and Agree with Plan of Care Patient           Patient will benefit  from skilled therapeutic intervention in order to improve the following deficits and impairments:  Abnormal gait,Decreased balance,Decreased endurance,Decreased coordination,Decreased range of motion,Decreased strength,Difficulty walking,Impaired tone,Postural dysfunction,Decreased activity tolerance,Decreased mobility,Impaired flexibility,Increased edema  Visit Diagnosis: Other abnormalities of gait and mobility  Muscle weakness (generalized)  Unsteadiness on feet  Repeated falls     Problem List Patient Active Problem List   Diagnosis Date Noted  . Thoracic outlet syndrome 05/03/2020  . Foreign body in conjunctival sac, right, initial encounter 03/28/2020  . Controlled type 1 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 ([place]) 03/24/2020  . Right epiretinal membrane 03/24/2020  . Retinal exudates and deposits 03/24/2020  . Recurrent erosion of left cornea 03/24/2020  . 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 Nuclear sclerotic cataract of both eyes 03/24/2020  . Gait disorder 03/23/2020  . Cough due to ACE inhibitor 03/03/2020  . Benign prostatic hyperplasia without lower urinary tract symptoms 03/01/2020  . Diabetic gastroparesis associated with type 1 diabetes mellitus ([place]) 03/01/2020  . Uncontrolled type 1 diabetes mellitus with hyperglycemia ([place]) 03/01/2020  . Gait disturbance 08/19/2019  . Acquired diplegia ([place]) 08/19/2019  . Vitamin D insufficiency 11/29/2016  . Hypogonadism in male 11/01/2016  . 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[place])   . Hypothyroidism   . Hypertension   . Abnormal liver function tests 03/06/2013  . Type I (juvenile type) diabetes mellitus without mention of complication, uncontrolled 12/03/2007  . HYPERCHOLESTEROLEMIA 11/12/2007  . MULTIPLE SCLEROSIS 08/29/2007  . PROLIFERATIVE DIABETIC RETINOPATHY 08/29/2007   Willow Ora, PTA, [facility] [address], [place], [place] [address] [phone] 08/23/20, 11:41 AM  Name: Johnathan Rosencrans  Ross MRN: [id number] Date of Birth: [birth year]

## 2020-08-25 ENCOUNTER — Ambulatory Visit: Payer: Medicare Other | Admitting: Physical Therapy

## 2020-08-25 ENCOUNTER — Encounter: Payer: Self-pay | Admitting: Physical Therapy

## 2020-08-25 ENCOUNTER — Other Ambulatory Visit: Payer: Self-pay

## 2020-08-25 DIAGNOSIS — R2681 Unsteadiness on feet: Secondary | ICD-10-CM

## 2020-08-25 DIAGNOSIS — R296 Repeated falls: Secondary | ICD-10-CM | POA: Diagnosis not present

## 2020-08-25 DIAGNOSIS — M6281 Muscle weakness (generalized): Secondary | ICD-10-CM | POA: Diagnosis not present

## 2020-08-25 DIAGNOSIS — R2689 Other abnormalities of gait and mobility: Secondary | ICD-10-CM

## 2020-08-25 NOTE — Therapy (Signed)
[facility] [address] [place], [place], [address] Phone: [phone]   Fax:  [phone]  Physical Therapy Treatment  Patient Details  Name: Johnathan Ross MRN: [id number] Date of Birth: 05/19/1971 Referring Provider (PT): Felecia Shelling, Nanine Means, MD   Encounter Date: 08/25/2020   PT End of Session - 08/25/20 0854    Visit Number 6    Number of Visits 17    Date for PT Re-Evaluation 11/01/20   60 day poc, 90 day cert   Authorization Type Medicare Part A and B; 10th visit PN    PT Start Time 0850    PT Stop Time 0930    PT Time Calculation (min) 40 min    Equipment Utilized During Treatment Other (comment);Gait belt   bil AFO (pt's on right, clinic on left)   Activity Tolerance Patient tolerated treatment well    Behavior During Therapy [facility] for tasks assessed/performed           Past Medical History:  Diagnosis Date  . Hyperlipidemia   . Hypertension   . Hypogonadism in male 11/01/2016  . Hypothyroidism   . Multiple sclerosis ([place])   . Proliferative diabetic retinopathy(362.02)   . Type 1 diabetes mellitus ([place]) dx'd 1994  . Vitamin D insufficiency 11/29/2016    Past Surgical History:  Procedure Laterality Date  . EYE SURGERY Bilateral    "laser for diabetic retinopathy"  . FRACTURE SURGERY    . IR VENO/EXT/UNI LEFT  03/24/2020  . OPEN REDUCTION INTERNAL FIXATION (ORIF) TIBIA/FIBULA FRACTURE Right 10/30/2016  . ORIF ANKLE FRACTURE Left 2015  . ORIF TIBIA FRACTURE Right 10/30/2016   Procedure: OPEN REDUCTION INTERNAL FIXATION (ORIF) TIBIA FIBULA FRACTURE;  Surgeon: Altamese Yolo, MD;  Location: [facility];  Service: Orthopedics;  Laterality: Right;  . RETINAL LASER PROCEDURE Bilateral    "for diabetic retinopathy"    There were no vitals filed for this visit.   Subjective Assessment - 08/25/20 0853    Subjective No new complaints. No falls or pain to report. Only needed to stop once with walking into the gym  today.    Pertinent History multiple falls, vitamin D insufficiency, Type 1 IDDM, diabetic retinopathy, hypothyroidism, HTN, hyperlipidemia, R tibia fracture, L ankle fracture with ORIF                  [facility] Adult PT Treatment/Exercise - 08/25/20 0855      Transfers   Transfers Sit to Stand;Stand to Sit    Sit to Stand 6: Modified independent (Device/Increase time);5: Supervision    Stand to Sit 6: Modified independent (Device/Increase time);5: Supervision      Ambulation/Gait   Ambulation/Gait Yes    Ambulation/Gait Assistance 5: Supervision;4: Min guard    Ambulation/Gait Assistance Details placed left posterior Thuasane [place]send brace on for gait in session with simulated toe cap to left shoe. less episodes of extensor tone occured with gait with brace on with pt stopping due to fatigue, not due to LE's locking out. Pt shown clinic shoe with simulated cap on it to see how it actually would look.    Ambulation Distance (Feet) 115 Feet   x2 with bil AFO's/simulated toe cap on left shoe   Assistive device Rolling walker;Other (Comment)    Gait Pattern Step-through pattern;Step-to pattern;Decreased step length - left;Decreased hip/knee flexion - right;Decreased hip/knee flexion - left;Decreased dorsiflexion - left;Left genu recurvatum    Ambulation Surface Level;Indoor      Neuro Re-ed  Neuro Re-ed Details  for balance/NMR: standing with feet hip width apart with bil AFO's on- no UE support for EO for head movements left<>right, up <>down for 10 reps each with min guard to min assist; then EC for 30 sec's x 3 reps, progressing to EC head movements left<>right for 10 reps each, no UE support with min guard to min assist at times for balance due to posterior lean.                 PT Short Term Goals - 08/03/20 1511      PT SHORT TERM GOAL #1   Title Patient will demonstrate independence with updated HEP for strengthening, balance and flexibility.    Time 4    Period Weeks     Status New    Target Date 09/02/20      PT SHORT TERM GOAL #2   Title Pt will increase speed from 0.60m/s (0.9ft/sec) to >0.40m/s for improved mobility.    Baseline 08/03/20 0.59m/s or 0.40ft/ sec,    Time 4    Period Weeks    Status New    Target Date 09/02/20      PT SHORT TERM GOAL #3   Title Pt will improve Berg from 25 to 31/56 or greater for improved balance.    Baseline 08/03/20 25/56    Time 4    Period Weeks    Status New    Target Date 09/02/20      PT SHORT TERM GOAL #4   Title Pt will ambulate > 200' with RW mod I over level surfaces inside for improved mobility.    Time 4    Period Weeks    Status New    Target Date 09/02/20      PT SHORT TERM GOAL #5   Title Pt will be further asssessed to determine if there is need for left AFO and/or toe cap.    Time 4    Period Weeks    Status New    Target Date 09/02/20             PT Long Term Goals - 08/03/20 1518      PT LONG TERM GOAL #1   Title Pt will ambulate >300' with bilateral loftstrand crutches for improved mobility on level paved or grass surfaces to be able to get to his buidling out back safely CGA.    Time 8    Period Weeks    Status New    Target Date 10/02/20      PT LONG TERM GOAL #2   Title Pt will increase gait speed to >0.75m/s  for improved gait safety.    Baseline 08/03/20 0.23m/s or 0.41ft/ sec    Time 8    Period Weeks    Status New    Target Date 10/02/20      PT LONG TERM GOAL #3   Title Pt will improve BERG to >/= 37/56 to decrease falls risk    Baseline 08/03/20 25/56    Time 8    Period Weeks    Status New    Target Date 10/02/20      PT LONG TERM GOAL #4   Title Pt will ambulate up/down 6 steps with rails mod I to safetly enter/exit home.    Time 8    Period Weeks    Status New    Target Date 10/02/20  Plan - 08/25/20 0855    Clinical Impression Statement Today's skilled session continued to focus on use of an AFO with simulated toe cap on the  left LE with improved gait mechanics noted. Pt does still fatigue with gait, however has less extnensor tone in the left LE. Will need continued use of brace on left LE to fully convince pt of it's benefits as pt continues to be reluctant. Remainder of the session focused on static standing balance with decreased UE support with min guard to min assist needed. No issues noted or reported by pt with session. The pt is progressing toward goals and should benefit from continued PT to progress toward unmet goals.    Personal Factors and Comorbidities Comorbidity 3+;Time since onset of injury/illness/exacerbation;Past/Current Experience    Comorbidities multiple falls, vitamin D insufficiency, Type 1 IDDM, diabetic retinopathy, hypothyroidism, HTN, hyperlipidemia, R tibia fracture, L ankle fracture with ORIF    Examination-Activity Limitations Carry;Locomotion Level;Stairs;Stand;Transfers;Squat    Examination-Participation Restrictions Community Activity;Driving;Yard Work    [employer]chant [employer] officer Evolving/Moderate complexity    Rehab Potential Good    PT Frequency 2x / week   plus eval   PT Duration 8 weeks    PT Treatment/Interventions ADLs/Self Care Home Management;Aquatic Therapy;Electrical Stimulation;DME Instruction;Gait training;Stair training;Functional mobility training;Therapeutic activities;Therapeutic exercise;Balance training;Neuromuscular re-education;Patient/family education;Orthotic Fit/Training;Passive range of motion;Energy conservation;Manual techniques    PT Next Visit Plan continue with use of left posterior AFO and simulated toe cap. Progress toward loftstrand crutches as able (he has them, has not used them). continue to work on ankle motions/strengthening, hip/core strengthening and standing balance toward LTGs.    Consulted and Agree with Plan of Care Patient           Patient will benefit from skilled therapeutic intervention in order to improve the following  deficits and impairments:  Abnormal gait,Decreased balance,Decreased endurance,Decreased coordination,Decreased range of motion,Decreased strength,Difficulty walking,Impaired tone,Postural dysfunction,Decreased activity tolerance,Decreased mobility,Impaired flexibility,Increased edema  Visit Diagnosis: Other abnormalities of gait and mobility  Muscle weakness (generalized)  Unsteadiness on feet  Repeated falls     Problem List Patient Active Problem List   Diagnosis Date Noted  . Thoracic outlet syndrome 05/03/2020  . Foreign body in conjunctival sac, right, initial encounter 03/28/2020  . Controlled type 1 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 ([place]) 03/24/2020  . Right epiretinal membrane 03/24/2020  . Retinal exudates and deposits 03/24/2020  . Recurrent erosion of left cornea 03/24/2020  . 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 Nuclear sclerotic cataract of both eyes 03/24/2020  . Gait disorder 03/23/2020  . Cough due to ACE inhibitor 03/03/2020  . Benign prostatic hyperplasia without lower urinary tract symptoms 03/01/2020  . Diabetic gastroparesis associated with type 1 diabetes mellitus ([place]) 03/01/2020  . Uncontrolled type 1 diabetes mellitus with hyperglycemia ([place]) 03/01/2020  . Gait disturbance 08/19/2019  . Acquired diplegia ([place]) 08/19/2019  . Vitamin D insufficiency 11/29/2016  . Hypogonadism in male 11/01/2016  . 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[place])   . Hypothyroidism   . Hypertension   . Abnormal liver function tests 03/06/2013  . Type I (juvenile type) diabetes mellitus without mention of complication, uncontrolled 12/03/2007  . HYPERCHOLESTEROLEMIA 11/12/2007  . MULTIPLE SCLEROSIS 08/29/2007  . PROLIFERATIVE DIABETIC RETINOPATHY 08/29/2007    Willow Ora, PTA, [facility] [address], [place], [place] [address] [phone] 08/25/20, 7:23 PM   Name: Johnathan Ross MRN: [id number] Date of Birth: [birth year]

## 2020-08-26 ENCOUNTER — Other Ambulatory Visit: Payer: Self-pay | Admitting: Endocrinology

## 2020-08-26 ENCOUNTER — Other Ambulatory Visit: Payer: Self-pay | Admitting: Neurology

## 2020-08-26 DIAGNOSIS — E782 Mixed hyperlipidemia: Secondary | ICD-10-CM

## 2020-08-29 ENCOUNTER — Ambulatory Visit: Payer: Medicare Other | Admitting: Internal Medicine

## 2020-08-29 ENCOUNTER — Other Ambulatory Visit: Payer: Self-pay | Admitting: *Deleted

## 2020-08-29 DIAGNOSIS — E1065 Type 1 diabetes mellitus with hyperglycemia: Secondary | ICD-10-CM

## 2020-08-29 MED ORDER — CONTOUR NEXT TEST VI STRP: ORAL_STRIP | 3 refills | Status: DC

## 2020-08-30 ENCOUNTER — Other Ambulatory Visit: Payer: Self-pay

## 2020-08-30 ENCOUNTER — Other Ambulatory Visit: Payer: Self-pay | Admitting: Neurology

## 2020-08-30 ENCOUNTER — Telehealth: Payer: Self-pay

## 2020-08-30 ENCOUNTER — Ambulatory Visit: Payer: Medicare Other | Attending: Neurology

## 2020-08-30 DIAGNOSIS — R2689 Other abnormalities of gait and mobility: Secondary | ICD-10-CM | POA: Diagnosis not present

## 2020-08-30 DIAGNOSIS — M6281 Muscle weakness (generalized): Secondary | ICD-10-CM

## 2020-08-30 DIAGNOSIS — R2681 Unsteadiness on feet: Secondary | ICD-10-CM | POA: Diagnosis not present

## 2020-08-30 DIAGNOSIS — G35 Multiple sclerosis: Secondary | ICD-10-CM

## 2020-08-30 DIAGNOSIS — R296 Repeated falls: Secondary | ICD-10-CM | POA: Diagnosis not present

## 2020-08-30 DIAGNOSIS — M21372 Foot drop, left foot: Secondary | ICD-10-CM

## 2020-08-30 NOTE — Telephone Encounter (Signed)
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  He will benefit from use of left AFO in order to improve safety with functional mobility.    If you agree, please submit request in EPIC under MD Order, Other Orders (list left AFO in comments) or fax to [facility] at [phone].   Thank you, Cherly Anderson, PT, DPT, [facility] [address] [place], [place]  [address] Phone:  [phone] Fax:  [phone]

## 2020-08-30 NOTE — Telephone Encounter (Signed)
I have entered the order.   Thank you for seeing him.

## 2020-08-30 NOTE — Telephone Encounter (Signed)
Thank you :)

## 2020-08-30 NOTE — Therapy (Signed)
[facility] [address] [place], [place], [address] Phone: [phone]   Fax:  [phone]  Physical Therapy Treatment  Patient Details  Name: Johnathan Ross MRN: [id number] Date of Birth: 14-Sep-1970 Referring Provider (PT): Britt Bottom, MD   Encounter Date: 08/30/2020   PT End of Session - 08/30/20 0847    Visit Number 7    Number of Visits 17    Date for PT Re-Evaluation 11/01/20   60 day poc, 90 day cert   Authorization Type Medicare Part A and B; 10th visit PN    PT Start Time 0842    PT Stop Time 0924    PT Time Calculation (min) 42 min    Equipment Utilized During Treatment Other (comment);Gait belt   bil AFO (pt's on right, clinic on left)   Activity Tolerance Patient tolerated treatment well    Behavior During Therapy [facility] for tasks assessed/performed           Past Medical History:  Diagnosis Date  . Hyperlipidemia   . Hypertension   . Hypogonadism in male 11/01/2016  . Hypothyroidism   . Multiple sclerosis ([place])   . Proliferative diabetic retinopathy(362.02)   . Type 1 diabetes mellitus ([place]) dx'd 1994  . Vitamin D insufficiency 11/29/2016    Past Surgical History:  Procedure Laterality Date  . EYE SURGERY Bilateral    "laser for diabetic retinopathy"  . FRACTURE SURGERY    . IR VENO/EXT/UNI LEFT  03/24/2020  . OPEN REDUCTION INTERNAL FIXATION (ORIF) TIBIA/FIBULA FRACTURE Right 10/30/2016  . ORIF ANKLE FRACTURE Left 2015  . ORIF TIBIA FRACTURE Right 10/30/2016   Procedure: OPEN REDUCTION INTERNAL FIXATION (ORIF) TIBIA FIBULA FRACTURE;  Surgeon: Altamese [place], MD;  Location: [facility];  Service: Orthopedics;  Laterality: Right;  . RETINAL LASER PROCEDURE Bilateral    "for diabetic retinopathy"    There were no vitals filed for this visit.   Subjective Assessment - 08/30/20 0847    Subjective Pt reports that his ability seems to vary day to day. Left leg not locking out as much with walking in  to clinic,    Pertinent History multiple falls, vitamin D insufficiency, Type 1 IDDM, diabetic retinopathy, hypothyroidism, HTN, hyperlipidemia, R tibia fracture, L ankle fracture with ORIF    Currently in Pain? No/denies                             [facility] Adult PT Treatment/Exercise - 08/30/20 0848      Transfers   Transfers Sit to Stand;Stand to Sit    Sit to Stand 6: Modified independent (Device/Increase time)    Stand to Sit 6: Modified independent (Device/Increase time)      Ambulation/Gait   Ambulation/Gait Yes    Ambulation/Gait Assistance 5: Supervision    Ambulation/Gait Assistance Details with left posterior Thuasne AFO donned in addition to his right. Pt ambulated an additional 115' with bilateral loftstrand crutches with 4 point gait pattern. Pt with good foot clearance on left, instead having some difficulty advancing RLE with less hip flexion. Pt assisted to try to facilitate weight shift during gait in last bout at times.    Ambulation Distance (Feet) 230 Feet   115' x 1 with bilateral loftstrands.   Assistive device Lofstrands    Gait Pattern Step-through pattern;Decreased step length - right;Decreased step length - left;Decreased hip/knee flexion - right;Decreased hip/knee flexion - left    Ambulation Surface  Level;Indoor    Gait velocity 0.337ms                  PT Education - 08/30/20 1307    Education Details Discussed benefit of left AFO and plan to request order from Dr. SFelecia Shelling Pt in agreement.    Person(s) Educated Patient    Methods Explanation    Comprehension Verbalized understanding            PT Short Term Goals - 08/30/20 0927      PT SHORT TERM GOAL #1   Title Patient will demonstrate independence with updated HEP for strengthening, balance and flexibility.    Time 4    Period Weeks    Status New    Target Date 09/02/20      PT SHORT TERM GOAL #2   Title Pt will increase speed from 0.141m (0.6024fec) to >0.88m/41mor  improved mobility.    Baseline 08/03/20 0.23m/32m 0.60ft/66f, 08/30/20 0.64m/s 22mime 4    Period Weeks    Status Not Met    Target Date 09/02/20      PT SHORT TERM GOAL #3   Title Pt will improve Berg from 25 to 31/56 or greater for improved balance.    Baseline 08/03/20 25/56    Time 4    Period Weeks    Status New    Target Date 09/02/20      PT SHORT TERM GOAL #4   Title Pt will ambulate > 200' with RW mod I over level surfaces inside for improved mobility.    Baseline 08/30/20 250' with RW mod I on level indoor surfaces.    Time 4    Period Weeks    Status Achieved    Target Date 09/02/20      PT SHORT TERM GOAL #5   Title Pt will be further asssessed to determine if there is need for left AFO and/or toe cap.    Baseline 08/30/20 Pt is showing improved gait quality with left AFO. PT requesting order for one    Time 4    Period Weeks    Status Achieved    Target Date 09/02/20             PT Long Term Goals - 08/03/20 1518      PT LONG TERM GOAL #1   Title Pt will ambulate >300' with bilateral loftstrand crutches for improved mobility on level paved or grass surfaces to be able to get to his buidling out back safely CGA.    Time 8    Period Weeks    Status New    Target Date 10/02/20      PT LONG TERM GOAL #2   Title Pt will increase gait speed to >0.37m/s  f77mimproved gait safety.    Baseline 08/03/20 0.23m/s or337m0ft/ sec23fTime 8    Period Weeks    Status New    Target Date 10/02/20      PT LONG TERM GOAL #3   Title Pt will improve BERG to >/= 37/56 to decrease falls risk    Baseline 08/03/20 25/56    Time 8    Period Weeks    Status New    Target Date 10/02/20      PT LONG TERM GOAL #4   Title Pt will ambulate up/down 6 steps with rails mod I to safetly enter/exit home.    Time 8  Period Weeks    Status New    Target Date 10/02/20                 Plan - 08/30/20 1308    Clinical Impression Statement PT began checking STGs. Pt did not meet  gait speed goal but did improve some. He met gait goal with walker with use of bilateral AFOs at mod I level on level surfaces. Pt with much improved left foot clearance with AFO donned instead having less clearance on right today as fatigued. PT discussed benefits of pursuing left AFO and pt in agreement so PT will request order. Started use of bilateral loftstrands today which allowed pt to have more upright posture and utilize legs more. Will benefit from continued practice.    Personal Factors and Comorbidities Comorbidity 3+;Time since onset of injury/illness/exacerbation;Past/Current Experience    Comorbidities multiple falls, vitamin D insufficiency, Type 1 IDDM, diabetic retinopathy, hypothyroidism, HTN, hyperlipidemia, R tibia fracture, L ankle fracture with ORIF    Examination-Activity Limitations Carry;Locomotion Level;Stairs;Stand;Transfers;Squat    Examination-Participation Restrictions Community Activity;Driving;Yard Work    [employer]chant [employer] officer Evolving/Moderate complexity    Rehab Potential Good    PT Frequency 2x / week   plus eval   PT Duration 8 weeks    PT Treatment/Interventions ADLs/Self Care Home Management;Aquatic Therapy;Electrical Stimulation;DME Instruction;Gait training;Stair training;Functional mobility training;Therapeutic activities;Therapeutic exercise;Balance training;Neuromuscular re-education;Patient/family education;Orthotic Fit/Training;Passive range of motion;Energy conservation;Manual techniques    PT Next Visit Plan I have sent request for left AFO. If back, schedule with [facility] a time to come out. Left posterior Thuasne AFO did good. Check remaining STGs. continue with use of left posterior AFO and simulated toe cap. Gait with bilateral  loftstrand crutches as able (he has them, has not used them). continue to work on ankle motions/strengthening, hip/core strengthening and standing balance toward LTGs.    Consulted and Agree with Plan of Care Patient            Patient will benefit from skilled therapeutic intervention in order to improve the following deficits and impairments:  Abnormal gait,Decreased balance,Decreased endurance,Decreased coordination,Decreased range of motion,Decreased strength,Difficulty walking,Impaired tone,Postural dysfunction,Decreased activity tolerance,Decreased mobility,Impaired flexibility,Increased edema  Visit Diagnosis: Other abnormalities of gait and mobility  Muscle weakness (generalized)     Problem List Patient Active Problem List   Diagnosis Date Noted  . Thoracic outlet syndrome 05/03/2020  . Foreign body in conjunctival sac, right, initial encounter 03/28/2020  . Controlled type 1 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 ([place]) 03/24/2020  . Right epiretinal membrane 03/24/2020  . Retinal exudates and deposits 03/24/2020  . Recurrent erosion of left cornea 03/24/2020  . 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 Nuclear sclerotic cataract of both eyes 03/24/2020  . Gait disorder 03/23/2020  . Cough due to ACE inhibitor 03/03/2020  . Benign prostatic hyperplasia without lower urinary tract symptoms 03/01/2020  . Diabetic gastroparesis associated with type 1 diabetes mellitus ([place]) 03/01/2020  . Uncontrolled type 1 diabetes mellitus with hyperglycemia ([place]) 03/01/2020  . Gait disturbance 08/19/2019  . Acquired diplegia ([place]) 08/19/2019  . Vitamin D insufficiency 11/29/2016  . Hypogonadism in male 11/01/2016  . 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[place])   . Hypothyroidism   . Hypertension   . Abnormal liver function tests 03/06/2013  . Type I (juvenile type) diabetes mellitus without mention of complication, uncontrolled 12/03/2007  . HYPERCHOLESTEROLEMIA 11/12/2007  . MULTIPLE SCLEROSIS 08/29/2007  . PROLIFERATIVE DIABETIC RETINOPATHY 08/29/2007    Electa Sniff,  PT, DPT, NCS 08/30/2020, 1:12 PM  [facility] [address] [place], [place], [address] Phone: [phone]   Fax:  [phone]  Name: Johnathan Ross MRN: [id number] Date of Birth: Feb 05, 1971

## 2020-08-31 ENCOUNTER — Other Ambulatory Visit: Payer: Self-pay | Admitting: *Deleted

## 2020-08-31 DIAGNOSIS — M21372 Foot drop, left foot: Secondary | ICD-10-CM

## 2020-09-02 ENCOUNTER — Encounter: Payer: Self-pay | Admitting: Physical Therapy

## 2020-09-02 ENCOUNTER — Other Ambulatory Visit: Payer: Self-pay

## 2020-09-02 ENCOUNTER — Ambulatory Visit: Payer: Medicare Other | Admitting: Physical Therapy

## 2020-09-02 DIAGNOSIS — R296 Repeated falls: Secondary | ICD-10-CM | POA: Diagnosis not present

## 2020-09-02 DIAGNOSIS — R2689 Other abnormalities of gait and mobility: Secondary | ICD-10-CM

## 2020-09-02 DIAGNOSIS — R2681 Unsteadiness on feet: Secondary | ICD-10-CM

## 2020-09-02 DIAGNOSIS — M6281 Muscle weakness (generalized): Secondary | ICD-10-CM | POA: Diagnosis not present

## 2020-09-02 NOTE — Therapy (Signed)
[facility] [address] [place], [place], [address] Phone: [phone]   Fax:  [phone]  Physical Therapy Treatment  Patient Details  Name: Johnathan Ross MRN: [id number] Date of Birth: January 31, 1971 Referring Provider (PT): Felecia Shelling, Nanine Means, MD   Encounter Date: 09/02/2020   PT End of Session - 09/02/20 0853    Visit Number 8    Number of Visits 17    Date for PT Re-Evaluation 11/01/20   60 day poc, 90 day cert   Authorization Type Medicare Part A and B; 10th visit PN    PT Start Time 0847    PT Stop Time 0930    PT Time Calculation (min) 43 min    Equipment Utilized During Treatment Other (comment);Gait belt   bil AFO (pt's on right, clinic on left)   Activity Tolerance Patient tolerated treatment well    Behavior During Therapy [facility] for tasks assessed/performed           Past Medical History:  Diagnosis Date  . Hyperlipidemia   . Hypertension   . Hypogonadism in male 11/01/2016  . Hypothyroidism   . Multiple sclerosis ([place])   . Proliferative diabetic retinopathy(362.02)   . Type 1 diabetes mellitus ([place]) dx'd 1994  . Vitamin D insufficiency 11/29/2016    Past Surgical History:  Procedure Laterality Date  . EYE SURGERY Bilateral    "laser for diabetic retinopathy"  . FRACTURE SURGERY    . IR VENO/EXT/UNI LEFT  03/24/2020  . OPEN REDUCTION INTERNAL FIXATION (ORIF) TIBIA/FIBULA FRACTURE Right 10/30/2016  . ORIF ANKLE FRACTURE Left 2015  . ORIF TIBIA FRACTURE Right 10/30/2016   Procedure: OPEN REDUCTION INTERNAL FIXATION (ORIF) TIBIA FIBULA FRACTURE;  Surgeon: Altamese Laureles, MD;  Location: [place];  Service: Orthopedics;  Laterality: Right;  . RETINAL LASER PROCEDURE Bilateral    "for diabetic retinopathy"    There were no vitals filed for this visit.   Subjective Assessment - 09/02/20 0852    Subjective No new complaints. No falls or pain to report.    Pertinent History multiple falls, vitamin D  insufficiency, Type 1 IDDM, diabetic retinopathy, hypothyroidism, HTN, hyperlipidemia, R tibia fracture, L ankle fracture with ORIF    Patient Stated Goals Wants to get back to working on his walking and strength.    Currently in Pain? No/denies              [facility] PT Assessment - 09/02/20 0856      Berg Balance Test   Sit to Stand Able to stand  independently using hands    Standing Unsupported Able to stand 30 seconds unsupported   posterior loss of balance at ~45 seconds   Sitting with Back Unsupported but Feet Supported on Floor or Stool Able to sit safely and securely 2 minutes    Stand to Sit Controls descent by using hands    Transfers Able to transfer with verbal cueing and /or supervision    Standing Unsupported with Eyes Closed Able to stand 10 seconds with supervision    Standing Unsupported with Feet Together Able to place feet together independently and stand for 1 minute with supervision    From Standing, Reach Forward with Outstretched Arm Can reach confidently >25 cm (10")                         [facility] Adult PT Treatment/Exercise - 09/02/20 0856      Transfers   Transfers Sit  to Stand;Stand to Sit    Sit to Stand 6: Modified independent (Device/Increase time)    Stand to Sit 6: Modified independent (Device/Increase time)      Ambulation/Gait   Ambulation/Gait Yes    Ambulation/Gait Assistance 5: Supervision    Ambulation/Gait Assistance Details with left posterior [place] AFO donned. Pt with increased bil LE extensor tone today. Does report not taking Baclofen for past 2 days as he wanted to see how his body would do since he was able to walk better at last session.    Ambulation Distance (Feet) 100 Feet   x1 with bil AFO's in session   Assistive device Rolling walker;Other (Comment)   bil AFO   Gait Pattern Step-through pattern;Decreased step length - right;Decreased step length - left;Decreased hip/knee flexion - right;Decreased hip/knee flexion -  left    Ambulation Surface Level;Indoor      Self-Care   Self-Care Other Self-Care Comments    Other Self-Care Comments  reviewed HEP. No issues noted or reported. Pt feels the program is still challenging as is, therefore did not make any changes or advancements.                    PT Short Term Goals - 09/02/20 0854      PT SHORT TERM GOAL #1   Title Patient will demonstrate independence with updated HEP for strengthening, balance and flexibility.    Baseline 09/02/20: pt reprots no issues and still challenging him    Status Achieved    Target Date --      PT SHORT TERM GOAL #2   Title Pt will increase speed from 0.69ms (0.616fsec) to >0.43m4mfor improved mobility.    Baseline 08/30/20 0.52m443m  Time --    Period --    Status Not Met    Target Date --      PT SHORT TERM GOAL #3   Title Pt will improve Berg from 25 to 31/56 or greater for improved balance.    Baseline 08/03/20 25/56    Time 4    Period Weeks    Status On-going    Target Date 09/02/20      PT SHORT TERM GOAL #4   Title Pt will ambulate > 200' with RW mod I over level surfaces inside for improved mobility.    Baseline 08/30/20 250' with RW mod I on level indoor surfaces.    Status Achieved    Target Date --      PT SHORT TERM GOAL #5   Title Pt will be further asssessed to determine if there is need for left AFO and/or toe cap.    Baseline 08/30/20 Pt is showing improved gait quality with left AFO. PT requesting order for one    Status Achieved    Target Date 09/02/20             PT Long Term Goals - 08/03/20 1518      PT LONG TERM GOAL #1   Title Pt will ambulate >300' with bilateral loftstrand crutches for improved mobility on level paved or grass surfaces to be able to get to his buidling out back safely CGA.    Time 8    Period Weeks    Status New    Target Date 10/02/20      PT LONG TERM GOAL #2   Title Pt will increase gait speed to >0.15m/s71mor improved gait safety.    Baseline  08/03/20 0.100m/s643m  or 0.76f/ sec    Time 8    Period Weeks    Status New    Target Date 10/02/20      PT LONG TERM GOAL #3   Title Pt will improve BERG to >/= 37/56 to decrease falls risk    Baseline 08/03/20 25/56    Time 8    Period Weeks    Status New    Target Date 10/02/20      PT LONG TERM GOAL #4   Title Pt will ambulate up/down 6 steps with rails mod I to safetly enter/exit home.    Time 8    Period Weeks    Status New    Target Date 10/02/20                 Plan - 09/02/20 0853    Clinical Impression Statement Today's skilled session continued with use of clinic AFO to left LE. Pt with increased extensor tone in bil LE's this session with LE's "locking out" several times. Pt did report not taking his Baclofen for past 2 days to see how his body would do since it was making him so tired. Plans to start back today after seeing how the tone returns. Started the B[employer] Southern Financialtoday, will need to complete it at pt's next session to finish checking pt's STGs. Pt did meet goal #1 for HEP this session. The pt should benefit from continued PT to progress toward unmet goals.    Personal Factors and Comorbidities Comorbidity 3+;Time since onset of injury/illness/exacerbation;Past/Current Experience    Comorbidities multiple falls, vitamin D insufficiency, Type 1 IDDM, diabetic retinopathy, hypothyroidism, HTN, hyperlipidemia, R tibia fracture, L ankle fracture with ORIF    Examination-Activity Limitations Carry;Locomotion Level;Stairs;Stand;Transfers;Squat    Examination-Participation Restrictions Community Activity;Driving;Yard Work    SMerchant navy officerEvolving/Moderate complexity    Rehab Potential Good    PT Frequency 2x / week   plus eval   PT Duration 8 weeks    PT Treatment/Interventions ADLs/Self Care Home Management;Aquatic Therapy;Electrical Stimulation;DME Instruction;Gait training;Stair training;Functional mobility training;Therapeutic  activities;Therapeutic exercise;Balance training;Neuromuscular re-education;Patient/family education;Orthotic Fit/Training;Passive range of motion;Energy conservation;Manual techniques    PT Next Visit Plan finish Berg Balance Test to check remaining STG. continue with use of left posterior AFO and simulated toe cap. Gait with bilateral  loftstrand crutches as able (he has them, has not used them). continue to work on ankle motions/strengthening, hip/core strengthening and standing balance toward LTGs.    Consulted and Agree with Plan of Care Patient           Patient will benefit from skilled therapeutic intervention in order to improve the following deficits and impairments:  Abnormal gait,Decreased balance,Decreased endurance,Decreased coordination,Decreased range of motion,Decreased strength,Difficulty walking,Impaired tone,Postural dysfunction,Decreased activity tolerance,Decreased mobility,Impaired flexibility,Increased edema  Visit Diagnosis: Other abnormalities of gait and mobility  Muscle weakness (generalized)  Unsteadiness on feet     Problem List Patient Active Problem List   Diagnosis Date Noted  . Thoracic outlet syndrome 05/03/2020  . Foreign body in conjunctival sac, right, initial encounter 03/28/2020  . Controlled type 1 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 ([place] 03/24/2020  . Right epiretinal membrane 03/24/2020  . Retinal exudates and deposits 03/24/2020  . Recurrent erosion of left cornea 03/24/2020  . 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 Nuclear sclerotic cataract of both eyes 03/24/2020  . Gait disorder 03/23/2020  . Cough due to ACE inhibitor 03/03/2020  . Benign prostatic hyperplasia without lower urinary tract  symptoms 03/01/2020  . Diabetic gastroparesis associated with type 1 diabetes mellitus ([place]) 03/01/2020  . Uncontrolled type 1 diabetes mellitus with hyperglycemia ([place]) 03/01/2020  . Gait disturbance  08/19/2019  . Acquired diplegia ([place]) 08/19/2019  . Vitamin D insufficiency 11/29/2016  . Hypogonadism in male 11/01/2016  . 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[place])   . Hypothyroidism   . Hypertension   . Abnormal liver function tests 03/06/2013  . Type I (juvenile type) diabetes mellitus without mention of complication, uncontrolled 12/03/2007  . HYPERCHOLESTEROLEMIA 11/12/2007  . MULTIPLE SCLEROSIS 08/29/2007  . PROLIFERATIVE DIABETIC RETINOPATHY 08/29/2007    Willow Ora, PTA, [facility] [address], [place], [place] [address] [phone] 09/02/20, 5:22 PM   Name: Johnathan Ross MRN: [id number] Date of Birth: [birth year]

## 2020-09-05 ENCOUNTER — Telehealth: Payer: Self-pay | Admitting: Neurology

## 2020-09-05 MED ORDER — BACLOFEN 10 MG PO TABS
ORAL_TABLET | ORAL | 11 refills | Status: DC
Start: 2020-09-05 — End: 2021-11-06

## 2020-09-05 NOTE — Telephone Encounter (Signed)
E-scribed refill as requested. 

## 2020-09-05 NOTE — Telephone Encounter (Signed)
Pt is needing a refill on his baclofen (LIORESAL) 10 MG tablet sent in to the [facility] on [address]

## 2020-09-06 ENCOUNTER — Ambulatory Visit: Payer: Medicare Other | Admitting: Vascular Surgery

## 2020-09-06 ENCOUNTER — Ambulatory Visit: Payer: Medicare Other | Admitting: Physical Therapy

## 2020-09-06 ENCOUNTER — Other Ambulatory Visit: Payer: Self-pay

## 2020-09-06 ENCOUNTER — Telehealth: Payer: Self-pay

## 2020-09-06 ENCOUNTER — Encounter: Payer: Self-pay | Admitting: Physical Therapy

## 2020-09-06 ENCOUNTER — Encounter (HOSPITAL_COMMUNITY): Payer: Medicare Other

## 2020-09-06 VITALS — BP 154/88

## 2020-09-06 DIAGNOSIS — M6281 Muscle weakness (generalized): Secondary | ICD-10-CM | POA: Diagnosis not present

## 2020-09-06 DIAGNOSIS — R2689 Other abnormalities of gait and mobility: Secondary | ICD-10-CM | POA: Diagnosis not present

## 2020-09-06 DIAGNOSIS — R296 Repeated falls: Secondary | ICD-10-CM | POA: Diagnosis not present

## 2020-09-06 DIAGNOSIS — R2681 Unsteadiness on feet: Secondary | ICD-10-CM

## 2020-09-06 NOTE — Therapy (Signed)
[facility] [address] [place], [place], [address] Phone: [phone]   Fax:  [phone]  Physical Therapy Treatment  Patient Details  Name: Johnathan Ross MRN: [id number] Date of Birth: Apr 28, 1971 Referring Provider (PT): Britt Bottom, MD   Encounter Date: 09/06/2020   PT End of Session - 09/06/20 0852    Visit Number 9    Number of Visits 17    Date for PT Re-Evaluation 11/01/20   60 day poc, 90 day cert   Authorization Type Medicare Part A and B; 10th visit PN    PT Start Time 0848    PT Stop Time 0930    PT Time Calculation (min) 42 min    Equipment Utilized During Treatment Other (comment);Gait belt   AFO (pt's on right)   Activity Tolerance Patient tolerated treatment well    Behavior During Therapy [facility] for tasks assessed/performed           Past Medical History:  Diagnosis Date  . Hyperlipidemia   . Hypertension   . Hypogonadism in male 11/01/2016  . Hypothyroidism   . Multiple sclerosis ([place])   . Proliferative diabetic retinopathy(362.02)   . Type 1 diabetes mellitus ([place]) dx'd 1994  . Vitamin D insufficiency 11/29/2016    Past Surgical History:  Procedure Laterality Date  . EYE SURGERY Bilateral    "laser for diabetic retinopathy"  . FRACTURE SURGERY    . IR VENO/EXT/UNI LEFT  03/24/2020  . OPEN REDUCTION INTERNAL FIXATION (ORIF) TIBIA/FIBULA FRACTURE Right 10/30/2016  . ORIF ANKLE FRACTURE Left 2015  . ORIF TIBIA FRACTURE Right 10/30/2016   Procedure: OPEN REDUCTION INTERNAL FIXATION (ORIF) TIBIA FIBULA FRACTURE;  Surgeon: Altamese Union, MD;  Location: [facility];  Service: Orthopedics;  Laterality: Right;  . RETINAL LASER PROCEDURE Bilateral    "for diabetic retinopathy"    Vitals:   09/06/20 0915  BP: (!) 154/88     Subjective Assessment - 09/06/20 0852    Subjective No new complaints. No falls or pain to report.    Pertinent History multiple falls, vitamin D insufficiency, Type 1  IDDM, diabetic retinopathy, hypothyroidism, HTN, hyperlipidemia, R tibia fracture, L ankle fracture with ORIF    Patient Stated Goals Wants to get back to working on his walking and strength.    Currently in Pain? No/denies              [facility] PT Assessment - 09/06/20 0853      Berg Balance Test   Sit to Stand Able to stand without using hands and stabilize independently   increased time needed to complete task, no assist   Standing Unsupported Able to stand 2 minutes with supervision    Sitting with Back Unsupported but Feet Supported on Floor or Stool Able to sit safely and securely 2 minutes    Stand to Sit Controls descent by using hands    Transfers Able to transfer safely, definite need of hands    Standing Unsupported with Eyes Closed Able to stand 10 seconds with supervision    Standing Unsupported with Feet Together Able to place feet together independently and stand for 1 minute with supervision    From Standing, Reach Forward with Outstretched Arm Can reach confidently >25 cm (10")    From Standing Position, Pick up Object from Floor Able to pick up shoe, needs supervision    From Standing Position, Turn to Look Behind Over each Shoulder Turn sideways only but maintains balance  Turn 360 Degrees Needs assistance while turning    Standing Unsupported, Alternately Place Feet on Step/Stool Able to complete >2 steps/needs minimal assist    Standing Unsupported, One Foot in Front Able to take small step independently and hold 30 seconds    Standing on One Leg Unable to try or needs assist to prevent fall    Total Score 35    Berg comment: 35/56 <36 = high risk for falls                [facility] Adult PT Treatment/Exercise - 09/06/20 0853      Transfers   Transfers Sit to Stand;Stand to Sit    Sit to Stand 6: Modified independent (Device/Increase time)    Stand to Sit 6: Modified independent (Device/Increase time)      Ambulation/Gait   Ambulation/Gait Yes     Ambulation/Gait Assistance 5: Supervision    Ambulation/Gait Assistance Details focused on slowing down with gait to emphasis hip/knee flexion with swing phase with decreased "rise" from UE's. The pt was able to do so, however needed to stop every 2-3 steps with a few bouts of 5-6 steps before tone kicked in locking out his LE, left>right. No balance issues noted.    Ambulation Distance (Feet) 100 Feet   x1, 115 x1   Assistive device Rolling walker;Other (Comment)   pt's right AFO.   Gait Pattern Step-through pattern;Decreased step length - right;Decreased step length - left;Decreased hip/knee flexion - right;Decreased hip/knee flexion - left    Ambulation Surface Level;Indoor              PT Short Term Goals - 09/02/20 0854      PT SHORT TERM GOAL #1   Title Patient will demonstrate independence with updated HEP for strengthening, balance and flexibility.    Baseline 09/02/20: pt reprots no issues and still challenging him    Status Achieved    Target Date --      PT SHORT TERM GOAL #2   Title Pt will increase speed from 0.642ms (0.613fsec) to >0.42m30mfor improved mobility.    Baseline 08/30/20 0.30m65m  Time --    Period --    Status Not Met    Target Date --      PT SHORT TERM GOAL #3   Title Pt will improve Berg from 25 to 31/56 or greater for improved balance.    Baseline 08/03/20 25/56    Time 4    Period Weeks    Status On-going    Target Date 09/02/20      PT SHORT TERM GOAL #4   Title Pt will ambulate > 200' with RW mod I over level surfaces inside for improved mobility.    Baseline 08/30/20 250' with RW mod I on level indoor surfaces.    Status Achieved    Target Date --      PT SHORT TERM GOAL #5   Title Pt will be further asssessed to determine if there is need for left AFO and/or toe cap.    Baseline 08/30/20 Pt is showing improved gait quality with left AFO. PT requesting order for one    Status Achieved    Target Date 09/02/20             PT Long Term  Goals - 08/03/20 1518      PT LONG TERM GOAL #1   Title Pt will ambulate >300' with bilateral loftstrand crutches for improved mobility on level paved  or grass surfaces to be able to get to his buidling out back safely CGA.    Time 8    Period Weeks    Status New    Target Date 10/02/20      PT LONG TERM GOAL #2   Title Pt will increase gait speed to >0.97ms  for improved gait safety.    Baseline 08/03/20 0.179m or 0.6030fsec    Time 8    Period Weeks    Status New    Target Date 10/02/20      PT LONG TERM GOAL #3   Title Pt will improve BERG to >/= 37/56 to decrease falls risk    Baseline 08/03/20 25/56    Time 8    Period Weeks    Status New    Target Date 10/02/20      PT LONG TERM GOAL #4   Title Pt will ambulate up/down 6 steps with rails mod I to safetly enter/exit home.    Time 8    Period Weeks    Status New    Target Date 10/02/20                 Plan - 09/06/20 0853    Clinical Impression Statement Today's skilled session initially focused on progress toward remaining STG with pt scoring 35/56 today meeting the goal. Remainder of session continued to focus on gait training with emphasis on hip/knee flexion with swing phase of gait. The pt is making steady progress toward goals and should benefit from continued PT to progress toward unmet goals.    Personal Factors and Comorbidities Comorbidity 3+;Time since onset of injury/illness/exacerbation;Past/Current Experience    Comorbidities multiple falls, vitamin D insufficiency, Type 1 IDDM, diabetic retinopathy, hypothyroidism, HTN, hyperlipidemia, R tibia fracture, L ankle fracture with ORIF    Examination-Activity Limitations Carry;Locomotion Level;Stairs;Stand;Transfers;Squat    Examination-Participation Restrictions Community Activity;Driving;Yard Work    StaMerchant navy officerolving/Moderate complexity    Rehab Potential Good    PT Frequency 2x / week   plus eval   PT Duration 8 weeks    PT  Treatment/Interventions ADLs/Self Care Home Management;Aquatic Therapy;Electrical Stimulation;DME Instruction;Gait training;Stair training;Functional mobility training;Therapeutic activities;Therapeutic exercise;Balance training;Neuromuscular re-education;Patient/family education;Orthotic Fit/Training;Passive range of motion;Energy conservation;Manual techniques    PT Next Visit Plan 10th visit progress note due next visit. continue with use of left posterior AFO and simulated toe cap. Gait with bilateral  loftstrand crutches as able (he has them, has not used them). continue to work on ankle motions/strengthening, hip/core strengthening and standing balance toward LTGs.    Consulted and Agree with Plan of Care Patient           Patient will benefit from skilled therapeutic intervention in order to improve the following deficits and impairments:  Abnormal gait,Decreased balance,Decreased endurance,Decreased coordination,Decreased range of motion,Decreased strength,Difficulty walking,Impaired tone,Postural dysfunction,Decreased activity tolerance,Decreased mobility,Impaired flexibility,Increased edema  Visit Diagnosis: Other abnormalities of gait and mobility  Muscle weakness (generalized)  Unsteadiness on feet     Problem List Patient Active Problem List   Diagnosis Date Noted  . Thoracic outlet syndrome 05/03/2020  . Foreign body in conjunctival sac, right, initial encounter 03/28/2020  . Controlled type 1 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 ([place]/28/2021  . Right epiretinal membrane 03/24/2020  . Retinal exudates and deposits 03/24/2020  . Recurrent erosion of left cornea 03/24/2020  . 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 Nuclear sclerotic cataract of both eyes 03/24/2020  . Gait disorder 03/23/2020  .  Cough due to ACE inhibitor 03/03/2020  . Benign prostatic hyperplasia without lower urinary tract symptoms 03/01/2020  . Diabetic  gastroparesis associated with type 1 diabetes mellitus ([place]) 03/01/2020  . Uncontrolled type 1 diabetes mellitus with hyperglycemia ([place]) 03/01/2020  . Gait disturbance 08/19/2019  . Acquired diplegia ([place]) 08/19/2019  . Vitamin D insufficiency 11/29/2016  . Hypogonadism in male 11/01/2016  . 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[place])   . Hypothyroidism   . Hypertension   . Abnormal liver function tests 03/06/2013  . Type I (juvenile type) diabetes mellitus without mention of complication, uncontrolled 12/03/2007  . HYPERCHOLESTEROLEMIA 11/12/2007  . MULTIPLE SCLEROSIS 08/29/2007  . PROLIFERATIVE DIABETIC RETINOPATHY 08/29/2007   Willow Ora, PTA, [facility] [address], [place], [place] [address] [phone] 09/06/20, 4:10 PM    Name: NIXXON FARIA MRN: [id number] Date of Birth: 08-Sep-1970

## 2020-09-06 NOTE — Telephone Encounter (Signed)
Patient's sister called to report that patient's girlfriend said patient had pain in left arm. Unknown quality of pain or if swelling is present. Main complaint is tiredness and "when he closes his eyes he sees something and then it fades away." Spoke with [facility] - patient okay to be seen in a few weeks. Advised sister to follow up with PCP for tiredness and placed on [facility] schedule for May 3.

## 2020-09-08 ENCOUNTER — Encounter: Payer: Self-pay | Admitting: Physical Therapy

## 2020-09-08 ENCOUNTER — Other Ambulatory Visit: Payer: Self-pay

## 2020-09-08 ENCOUNTER — Ambulatory Visit: Payer: Medicare Other | Admitting: Physical Therapy

## 2020-09-08 DIAGNOSIS — R2681 Unsteadiness on feet: Secondary | ICD-10-CM | POA: Diagnosis not present

## 2020-09-08 DIAGNOSIS — R296 Repeated falls: Secondary | ICD-10-CM | POA: Diagnosis not present

## 2020-09-08 DIAGNOSIS — M6281 Muscle weakness (generalized): Secondary | ICD-10-CM | POA: Diagnosis not present

## 2020-09-08 DIAGNOSIS — R2689 Other abnormalities of gait and mobility: Secondary | ICD-10-CM | POA: Diagnosis not present

## 2020-09-09 NOTE — Therapy (Signed)
[facility] [address] [place], [place], [address] Phone: [phone]   Fax:  [phone]  Physical Therapy Treatment  Patient Details  Name: Johnathan Ross MRN: [id number] Date of Birth: 12-30-1970 Referring Provider (PT): Britt Bottom, MD   Encounter Date: 09/08/2020     09/08/20 0851  PT Visits / Re-Eval  Visit Number 10  Number of Visits 17  Date for PT Re-Evaluation 11/01/20 (60 day poc, 90 day cert)  Authorization  Authorization Type Medicare Part A and B; 10th visit PN  PT Time Calculation  PT Start Time 0848  PT Stop Time 0930  PT Time Calculation (min) 42 min  PT - End of Session  Equipment Utilized During Treatment Other (comment);Gait belt (AFO (pt's on right))  Activity Tolerance Patient tolerated treatment well  Behavior During Therapy [facility] for tasks assessed/performed    Past Medical History:  Diagnosis Date  . Hyperlipidemia   . Hypertension   . Hypogonadism in male 11/01/2016  . Hypothyroidism   . Multiple sclerosis ([place])   . Proliferative diabetic retinopathy(362.02)   . Type 1 diabetes mellitus ([place]) dx'd 1994  . Vitamin D insufficiency 11/29/2016    Past Surgical History:  Procedure Laterality Date  . EYE SURGERY Bilateral    "laser for diabetic retinopathy"  . FRACTURE SURGERY    . IR VENO/EXT/UNI LEFT  03/24/2020  . OPEN REDUCTION INTERNAL FIXATION (ORIF) TIBIA/FIBULA FRACTURE Right 10/30/2016  . ORIF ANKLE FRACTURE Left 2015  . ORIF TIBIA FRACTURE Right 10/30/2016   Procedure: OPEN REDUCTION INTERNAL FIXATION (ORIF) TIBIA FIBULA FRACTURE;  Surgeon: Altamese Esbon, MD;  Location: [facility];  Service: Orthopedics;  Laterality: Right;  . RETINAL LASER PROCEDURE Bilateral    "for diabetic retinopathy"    There were no vitals filed for this visit.     09/08/20 0851  Symptoms/Limitations  Subjective No new complaints. No falls or pain to report. HEP is going well at home. Having  muscle pain in left elbow when ever he moves the elbow. Plans to talk to his PCP on his next appt. No swelling noted.  Pertinent History multiple falls, vitamin D insufficiency, Type 1 IDDM, diabetic retinopathy, hypothyroidism, HTN, hyperlipidemia, R tibia fracture, L ankle fracture with ORIF  Patient Stated Goals Wants to get back to working on his walking and strength.  Pain Assessment  Currently in Pain? No/denies      09/08/20 0852  Transfers  Transfers Sit to Stand;Stand to Sit  Sit to Stand 6: Modified independent (Device/Increase time)  Stand to Sit 6: Modified independent (Device/Increase time)  Ambulation/Gait  Ambulation/Gait Yes  Ambulation/Gait Assistance 5: Supervision  Ambulation/Gait Assistance Details around gym with session  Assistive device Rolling walker;Other (Comment)  Gait Pattern Step-through pattern;Decreased step length - right;Decreased step length - left;Decreased hip/knee flexion - right;Decreased hip/knee flexion - left  Ambulation Surface Level;Indoor  Neuro Re-ed   Neuro Re-ed Details  for core/postural strenthening/muscle re-ed: seated on green air disc- with 3# ball UE raises x10 reps with emphasis on tall posture, diagonals knee to opposite side shoulder height for 10 reps each side with emphasis on posture/balance. Then with green band for shoulder horizonal abduction for 10 reps, then alternating diagonal pulls for 10 reps each way. cues on posture and form.  Exercises  Exercises Other Exercises  Other Exercises  for bil ankle strenghtening: with prostretch for heel cord stretching 30 sec's x 3 reps; fitter board for ant/post direction, latearal taps for 10 reps  each way with stability at knee and overpressure/assist at ankle; red band- left ankle eve/inv for 10 reps, then yellow band right ankle eve/inver attempted with pt unable to move with band resistance, therefore swiched to isometrics with 3 sec holds for 10 reps each way.       PT Short Term  Goals - 09/02/20 0854      PT SHORT TERM GOAL #1   Title Patient will demonstrate independence with updated HEP for strengthening, balance and flexibility.    Baseline 09/02/20: pt reprots no issues and still challenging him    Status Achieved    Target Date --      PT SHORT TERM GOAL #2   Title Pt will increase speed from 0.58ms (0.681fsec) to >0.998m5998mfor improved mobility.    Baseline 08/30/20 0.23m82m  Time --    Period --    Status Not Met    Target Date --      PT SHORT TERM GOAL #3   Title Pt will improve Berg from 25 to 31/56 or greater for improved balance.    Baseline 08/03/20 25/56    Time 4    Period Weeks    Status On-going    Target Date 09/02/20      PT SHORT TERM GOAL #4   Title Pt will ambulate > 200' with RW mod I over level surfaces inside for improved mobility.    Baseline 08/30/20 250' with RW mod I on level indoor surfaces.    Status Achieved    Target Date --      PT SHORT TERM GOAL #5   Title Pt will be further asssessed to determine if there is need for left AFO and/or toe cap.    Baseline 08/30/20 Pt is showing improved gait quality with left AFO. PT requesting order for one    Status Achieved    Target Date 09/02/20             PT Long Term Goals - 08/03/20 1518      PT LONG TERM GOAL #1   Title Pt will ambulate >300' with bilateral loftstrand crutches for improved mobility on level paved or grass surfaces to be able to get to his buidling out back safely CGA.    Time 8    Period Weeks    Status New    Target Date 10/02/20      PT LONG TERM GOAL #2   Title Pt will increase gait speed to >0.98m/s44mor improved gait safety.    Baseline 08/03/20 0.6m/s498m0.60ft/ 24f   Time 8    Period Weeks    Status New    Target Date 10/02/20      PT LONG TERM GOAL #3   Title Pt will improve BERG to >/= 37/56 to decrease falls risk    Baseline 08/03/20 25/56    Time 8    Period Weeks    Status New    Target Date 10/02/20      PT LONG TERM GOAL #4    Title Pt will ambulate up/down 6 steps with rails mod I to safetly enter/exit home.    Time 8    Period Weeks    Status New    Target Date 10/02/20              09/08/20 0851  Plan  Clinical Impression Statement Today's skilled session focused on core strengthening/stability and bil ankle ROM/strengthenging with no  issues noted or reported in session. The pt is making steady progress toward goals and should benefit from continued PT to progress toward unmet goals.  Personal Factors and Comorbidities Comorbidity 3+;Time since onset of injury/illness/exacerbation;Past/Current Experience  Comorbidities multiple falls, vitamin D insufficiency, Type 1 IDDM, diabetic retinopathy, hypothyroidism, HTN, hyperlipidemia, R tibia fracture, L ankle fracture with ORIF  Examination-Activity Limitations Carry;Locomotion Level;Stairs;Stand;Transfers;Squat  Examination-Participation Restrictions Community Activity;Driving;Yard Work  Pt will benefit from skilled therapeutic intervention in order to improve on the following deficits Abnormal gait;Decreased balance;Decreased endurance;Decreased coordination;Decreased range of motion;Decreased strength;Difficulty walking;Impaired tone;Postural dysfunction;Decreased activity tolerance;Decreased mobility;Impaired flexibility;Increased edema  Stability/Clinical Decision Making Evolving/Moderate complexity  Rehab Potential Good  PT Frequency 2x / week (plus eval)  PT Duration 8 weeks  PT Treatment/Interventions ADLs/Self Care Home Management;Aquatic Therapy;Electrical Stimulation;DME Instruction;Gait training;Stair training;Functional mobility training;Therapeutic activities;Therapeutic exercise;Balance training;Neuromuscular re-education;Patient/family education;Orthotic Fit/Training;Passive range of motion;Energy conservation;Manual techniques  PT Next Visit Plan continue with use of left posterior AFO and simulated toe cap. Gait with bilateral  loftstrand crutches  as able (he has them, has not used them). continue to work on ankle motions/strengthening, hip/core strengthening and standing balance toward LTGs.  Consulted and Agree with Plan of Care Patient         Patient will benefit from skilled therapeutic intervention in order to improve the following deficits and impairments:  Abnormal gait,Decreased balance,Decreased endurance,Decreased coordination,Decreased range of motion,Decreased strength,Difficulty walking,Impaired tone,Postural dysfunction,Decreased activity tolerance,Decreased mobility,Impaired flexibility,Increased edema  Visit Diagnosis: Muscle weakness (generalized)  Other abnormalities of gait and mobility  Unsteadiness on feet  Repeated falls     Problem List Patient Active Problem List   Diagnosis Date Noted  . Thoracic outlet syndrome 05/03/2020  . Foreign body in conjunctival sac, right, initial encounter 03/28/2020  . Controlled type 1 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 ([place]) 03/24/2020  . Right epiretinal membrane 03/24/2020  . Retinal exudates and deposits 03/24/2020  . Recurrent erosion of left cornea 03/24/2020  . 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 Nuclear sclerotic cataract of both eyes 03/24/2020  . Gait disorder 03/23/2020  . Cough due to ACE inhibitor 03/03/2020  . Benign prostatic hyperplasia without lower urinary tract symptoms 03/01/2020  . Diabetic gastroparesis associated with type 1 diabetes mellitus ([place]) 03/01/2020  . Uncontrolled type 1 diabetes mellitus with hyperglycemia ([place]) 03/01/2020  . Gait disturbance 08/19/2019  . Acquired diplegia ([place]) 08/19/2019  . Vitamin D insufficiency 11/29/2016  . Hypogonadism in male 11/01/2016  . 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[place])   . Hypothyroidism   . Hypertension   . Abnormal liver function tests 03/06/2013  . Type I (juvenile type) diabetes mellitus without mention of complication,  uncontrolled 12/03/2007  . HYPERCHOLESTEROLEMIA 11/12/2007  . MULTIPLE SCLEROSIS 08/29/2007  . PROLIFERATIVE DIABETIC RETINOPATHY 08/29/2007    Willow Ora, PTA, [facility] [address], [place], [place] [address] [phone] 09/11/20, 8:57 PM   Name: KEVYN BOQUET MRN: [id number] Date of Birth: [birth year]

## 2020-09-20 ENCOUNTER — Other Ambulatory Visit: Payer: Self-pay

## 2020-09-20 ENCOUNTER — Ambulatory Visit: Payer: Medicare Other

## 2020-09-20 ENCOUNTER — Ambulatory Visit: Payer: Medicare Other | Admitting: Physical Therapy

## 2020-09-20 DIAGNOSIS — M6281 Muscle weakness (generalized): Secondary | ICD-10-CM | POA: Diagnosis not present

## 2020-09-20 DIAGNOSIS — R296 Repeated falls: Secondary | ICD-10-CM | POA: Diagnosis not present

## 2020-09-20 DIAGNOSIS — R2689 Other abnormalities of gait and mobility: Secondary | ICD-10-CM

## 2020-09-20 DIAGNOSIS — R2681 Unsteadiness on feet: Secondary | ICD-10-CM | POA: Diagnosis not present

## 2020-09-20 NOTE — Therapy (Signed)
[facility] [address] [place], [place], [address] Phone: [phone]   Fax:  [phone]  Physical Therapy Treatment  Patient Details  Name: Johnathan Ross MRN: [id number] Date of Birth: May 08, 1971 Referring Provider (PT): Britt Bottom, MD   Encounter Date: 09/20/2020   PT End of Session - 09/20/20 0855    Visit Number 11    Number of Visits 17    Date for PT Re-Evaluation 11/01/20   60 day poc, 90 day cert   Authorization Type Medicare Part A and B; 10th visit PN    PT Start Time 0850    PT Stop Time 0932    PT Time Calculation (min) 42 min    Equipment Utilized During Treatment Other (comment);Gait belt   AFO (pt's on right)   Activity Tolerance Patient tolerated treatment well    Behavior During Therapy [facility] for tasks assessed/performed           Past Medical History:  Diagnosis Date  . Hyperlipidemia   . Hypertension   . Hypogonadism in male 11/01/2016  . Hypothyroidism   . Multiple sclerosis ([place])   . Proliferative diabetic retinopathy(362.02)   . Type 1 diabetes mellitus ([place]) dx'd 1994  . Vitamin D insufficiency 11/29/2016    Past Surgical History:  Procedure Laterality Date  . EYE SURGERY Bilateral    "laser for diabetic retinopathy"  . FRACTURE SURGERY    . IR VENO/EXT/UNI LEFT  03/24/2020  . OPEN REDUCTION INTERNAL FIXATION (ORIF) TIBIA/FIBULA FRACTURE Right 10/30/2016  . ORIF ANKLE FRACTURE Left 2015  . ORIF TIBIA FRACTURE Right 10/30/2016   Procedure: OPEN REDUCTION INTERNAL FIXATION (ORIF) TIBIA FIBULA FRACTURE;  Surgeon: Altamese Cleary, MD;  Location: [facility];  Service: Orthopedics;  Laterality: Right;  . RETINAL LASER PROCEDURE Bilateral    "for diabetic retinopathy"    There were no vitals filed for this visit.   Subjective Assessment - 09/20/20 0855    Subjective Pt reports that clamshells are getting easier. He has slowed them down and still able to do 50. Does find that the  bridges and lifting one leg feel less stable when lifts right leg.    Pertinent History multiple falls, vitamin D insufficiency, Type 1 IDDM, diabetic retinopathy, hypothyroidism, HTN, hyperlipidemia, R tibia fracture, L ankle fracture with ORIF    Patient Stated Goals Wants to get back to working on his walking and strength.    Currently in Pain? No/denies                             [facility] Adult PT Treatment/Exercise - 09/20/20 0902      Ambulation/Gait   Ambulation/Gait Yes    Ambulation/Gait Assistance 5: Supervision;4: Min guard    Ambulation/Gait Assistance Details Pt assisted to weight shift to help with advancing leg. Pt was initially catching right foot when advancing but improved as went on.    Ambulation Distance (Feet) 115 Feet    Assistive device Lofstrands   bilateral AFOs   Gait Pattern Step-through pattern;Decreased hip/knee flexion - right;Decreased hip/knee flexion - left;Poor foot clearance - right    Ambulation Surface Level;Indoor      Exercises   Exercises Other Exercises    Other Exercises  Bridges with unilateral march at top x 10 each with verbal cues to engage core to help with stability which did help. Pt more challenged with raising LLE stabilizing on RLE. Sidelying clamshells with  green theraband 10 x 2 with verbal cues for form for LLE then no resistance on right. Instructed to add green theraband with left clamshell.                  PT Education - 09/20/20 0944    Education Details Added green theraband to left clamshell. Gave pt the order for left AFO so he could contact [employer].    Person(s) Educated Patient    Methods Explanation    Comprehension Verbalized understanding            PT Short Term Goals - 09/02/20 0854      PT SHORT TERM GOAL #1   Title Patient will demonstrate independence with updated HEP for strengthening, balance and flexibility.    Baseline 09/02/20: pt reprots no issues and still challenging him     Status Achieved    Target Date --      PT SHORT TERM GOAL #2   Title Pt will increase speed from 0.51ms (0.621fsec) to >0.83m27mfor improved mobility.    Baseline 08/30/20 0.59m17m  Time --    Period --    Status Not Met    Target Date --      PT SHORT TERM GOAL #3   Title Pt will improve Berg from 25 to 31/56 or greater for improved balance.    Baseline 08/03/20 25/56    Time 4    Period Weeks    Status On-going    Target Date 09/02/20      PT SHORT TERM GOAL #4   Title Pt will ambulate > 200' with RW mod I over level surfaces inside for improved mobility.    Baseline 08/30/20 250' with RW mod I on level indoor surfaces.    Status Achieved    Target Date --      PT SHORT TERM GOAL #5   Title Pt will be further asssessed to determine if there is need for left AFO and/or toe cap.    Baseline 08/30/20 Pt is showing improved gait quality with left AFO. PT requesting order for one    Status Achieved    Target Date 09/02/20             PT Long Term Goals - 08/03/20 1518      PT LONG TERM GOAL #1   Title Pt will ambulate >300' with bilateral loftstrand crutches for improved mobility on level paved or grass surfaces to be able to get to his buidling out back safely CGA.    Time 8    Period Weeks    Status New    Target Date 10/02/20      PT LONG TERM GOAL #2   Title Pt will increase gait speed to >0.98m/s17mor improved gait safety.    Baseline 08/03/20 0.42m/s883m0.60ft/ 77f   Time 8    Period Weeks    Status New    Target Date 10/02/20      PT LONG TERM GOAL #3   Title Pt will improve BERG to >/= 37/56 to decrease falls risk    Baseline 08/03/20 25/56    Time 8    Period Weeks    Status New    Target Date 10/02/20      PT LONG TERM GOAL #4   Title Pt will ambulate up/down 6 steps with rails mod I to safetly enter/exit home.    Time 8    Period Weeks  Status New    Target Date 10/02/20                 Plan - 09/20/20 0945    Clinical Impression Statement  Pt did well with gait with adding left posterior [place] AFO with bilateral loftstrands after he got started. Increased tone in right leg initially that improved as went on with better clearance. Was able to add resistance to left clamshell to progress. Pt also able to better stabilize with bridge with march when cued to engage core.    Personal Factors and Comorbidities Comorbidity 3+;Time since onset of injury/illness/exacerbation;Past/Current Experience    Comorbidities multiple falls, vitamin D insufficiency, Type 1 IDDM, diabetic retinopathy, hypothyroidism, HTN, hyperlipidemia, R tibia fracture, L ankle fracture with ORIF    Examination-Activity Limitations Carry;Locomotion Level;Stairs;Stand;Transfers;Squat    Examination-Participation Restrictions Community Activity;Driving;Yard Work    [employer] Evolving/Moderate complexity    Rehab Potential Good    PT Frequency 2x / week   plus eval   PT Duration 8 weeks    PT Treatment/Interventions ADLs/Self Care Home Management;Aquatic Therapy;Electrical Stimulation;DME Instruction;Gait training;Stair training;Functional mobility training;Therapeutic activities;Therapeutic exercise;Balance training;Neuromuscular re-education;Patient/family education;Orthotic Fit/Training;Passive range of motion;Energy conservation;Manual techniques    PT Next Visit Plan I gave pt prescription for left AFO. Did he contact [employer] yet? continue with use of left posterior [place] AFO. Gait with bilateral  loftstrand crutches as able (he has them, has not used them). continue to work on ankle motions/strengthening, hip/core strengthening and standing balance toward LTGs.    Consulted and Agree with Plan of Care Patient           Patient will benefit from skilled therapeutic intervention in order to improve the following deficits and impairments:  Abnormal gait,Decreased balance,Decreased endurance,Decreased coordination,Decreased range of  motion,Decreased strength,Difficulty walking,Impaired tone,Postural dysfunction,Decreased activity tolerance,Decreased mobility,Impaired flexibility,Increased edema  Visit Diagnosis: Other abnormalities of gait and mobility  Muscle weakness (generalized)     Problem List Patient Active Problem List   Diagnosis Date Noted  . Thoracic outlet syndrome 05/03/2020  . Foreign body in conjunctival sac, right, initial encounter 03/28/2020  . Controlled type 1 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 ([place]) 03/24/2020  . Right epiretinal membrane 03/24/2020  . Retinal exudates and deposits 03/24/2020  . Recurrent erosion of left cornea 03/24/2020  . 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 Nuclear sclerotic cataract of both eyes 03/24/2020  . Gait disorder 03/23/2020  . Cough due to ACE inhibitor 03/03/2020  . Benign prostatic hyperplasia without lower urinary tract symptoms 03/01/2020  . Diabetic gastroparesis associated with type 1 diabetes mellitus ([place]) 03/01/2020  . Uncontrolled type 1 diabetes mellitus with hyperglycemia ([place]) 03/01/2020  . Gait disturbance 08/19/2019  . Acquired diplegia ([place]) 08/19/2019  . Vitamin D insufficiency 11/29/2016  . Hypogonadism in male 11/01/2016  . 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[place])   . Hypothyroidism   . Hypertension   . Abnormal liver function tests 03/06/2013  . Type I (juvenile type) diabetes mellitus without mention of complication, uncontrolled 12/03/2007  . HYPERCHOLESTEROLEMIA 11/12/2007  . MULTIPLE SCLEROSIS 08/29/2007  . PROLIFERATIVE DIABETIC RETINOPATHY 08/29/2007    Electa Sniff, PT, DPT, NCS 09/20/2020, 9:48 AM  [facility] [address] [place], [place], [address] Phone: [phone]   Fax:  [phone]  Name: Johnathan Ross MRN: [id number] Date of Birth: [birth year]

## 2020-09-21 ENCOUNTER — Encounter: Payer: Self-pay | Admitting: Internal Medicine

## 2020-09-21 ENCOUNTER — Encounter: Payer: Self-pay | Admitting: Neurology

## 2020-09-21 ENCOUNTER — Ambulatory Visit (INDEPENDENT_AMBULATORY_CARE_PROVIDER_SITE_OTHER): Payer: Medicare Other | Admitting: Neurology

## 2020-09-21 ENCOUNTER — Ambulatory Visit (INDEPENDENT_AMBULATORY_CARE_PROVIDER_SITE_OTHER): Payer: Medicare Other | Admitting: Internal Medicine

## 2020-09-21 ENCOUNTER — Telehealth: Payer: Self-pay | Admitting: Neurology

## 2020-09-21 VITALS — BP 124/70 | HR 91 | Ht 72.99 in | Wt 208.5 lb

## 2020-09-21 VITALS — BP 138/78 | HR 86 | Temp 98.5°F | Resp 16 | Ht 72.99 in | Wt 208.0 lb

## 2020-09-21 DIAGNOSIS — R39198 Other difficulties with micturition: Secondary | ICD-10-CM | POA: Diagnosis not present

## 2020-09-21 DIAGNOSIS — N3281 Overactive bladder: Secondary | ICD-10-CM | POA: Diagnosis not present

## 2020-09-21 DIAGNOSIS — H11431 Conjunctival hyperemia, right eye: Secondary | ICD-10-CM | POA: Insufficient documentation

## 2020-09-21 DIAGNOSIS — G35 Multiple sclerosis: Secondary | ICD-10-CM | POA: Diagnosis not present

## 2020-09-21 DIAGNOSIS — E1065 Type 1 diabetes mellitus with hyperglycemia: Secondary | ICD-10-CM | POA: Diagnosis not present

## 2020-09-21 DIAGNOSIS — I10 Essential (primary) hypertension: Secondary | ICD-10-CM

## 2020-09-21 DIAGNOSIS — R269 Unspecified abnormalities of gait and mobility: Secondary | ICD-10-CM

## 2020-09-21 DIAGNOSIS — G540 Brachial plexus disorders: Secondary | ICD-10-CM

## 2020-09-21 DIAGNOSIS — G830 Diplegia of upper limbs: Secondary | ICD-10-CM | POA: Diagnosis not present

## 2020-09-21 LAB — URINALYSIS, ROUTINE W REFLEX MICROSCOPIC
Bilirubin Urine: NEGATIVE
Hgb urine dipstick: NEGATIVE
Ketones, ur: NEGATIVE
Leukocytes,Ua: NEGATIVE
Nitrite: NEGATIVE
RBC / HPF: NONE SEEN (ref 0–?)
Specific Gravity, Urine: 1.015 (ref 1.000–1.030)
Total Protein, Urine: NEGATIVE
Urine Glucose: NEGATIVE
Urobilinogen, UA: 0.2 (ref 0.0–1.0)
WBC, UA: NONE SEEN (ref 0–?)
pH: 7 (ref 5.0–8.0)

## 2020-09-21 LAB — D-DIMER, QUANTITATIVE: D-Dimer, Quant: 0.22 mcg/mL FEU (ref <0.50)

## 2020-09-21 MED ORDER — TAMSULOSIN HCL 0.4 MG PO CAPS: 0.4000 mg | ORAL_CAPSULE | Freq: Every day | ORAL | 5 refills | Status: DC

## 2020-09-21 MED ORDER — DIAZEPAM 5 MG PO TABS: 5.0000 mg | ORAL_TABLET | Freq: Four times a day (QID) | ORAL | 3 refills | Status: DC | PRN

## 2020-09-21 MED ORDER — SOLIFENACIN SUCCINATE 5 MG PO TABS: 5.0000 mg | ORAL_TABLET | Freq: Every day | ORAL | 0 refills | Status: DC

## 2020-09-21 MED ORDER — GVOKE HYPOPEN 2-PACK 1 MG/0.2ML ~~LOC~~ SOAJ: 1.0000 | Freq: Every day | SUBCUTANEOUS | 5 refills | Status: DC | PRN

## 2020-09-21 NOTE — Patient Instructions (Signed)
Overactive Bladder, Adult  Overactive bladder is a condition in which a person has a sudden and frequent need to urinate. A person might also leak urine if he or she cannot get to the bathroom fast enough (urinary incontinence). Sometimes, symptoms can interfere with work or social activities. What are the causes? Overactive bladder is associated with poor nerve signals between your bladder and your brain. Your bladder may get the signal to empty before it is full. You may also have very sensitive muscles that make your bladder squeeze too soon. This condition may also be caused by other factors, such as:  Medical conditions: ? Urinary tract infection. ? Infection of nearby tissues. ? Prostate enlargement. ? Bladder stones, inflammation, or tumors. ? Diabetes. ? Muscle or nerve weakness, especially from these conditions:  A spinal cord injury.  Stroke.  Multiple sclerosis.  Parkinson's disease.  Other causes: ? Surgery on the uterus or urethra. ? Drinking too much caffeine or alcohol. ? Certain medicines, especially those that eliminate extra fluid in the body (diuretics). ? Constipation. What increases the risk? You may be at greater risk for overactive bladder if you:  Are an older adult.  Smoke.  Are going through menopause.  Have prostate problems.  Have a neurological disease, such as stroke, dementia, Parkinson's disease, or multiple sclerosis (MS).  Eat or drink alcohol, spicy food, caffeine, and other things that irritate the bladder.  Are overweight or obese. What are the signs or symptoms? Symptoms of this condition include a sudden, strong urge to urinate. Other symptoms include:  Leaking urine.  Urinating 8 or more times a day.  Waking up to urinate 2 or more times overnight. How is this diagnosed? This condition may be diagnosed based on:  Your symptoms and medical history.  A physical exam.  Blood or urine tests to check for possible causes,  such as infection. You may also need to see a health care provider who specializes in urinary tract problems. This is called a urologist. How is this treated? Treatment for overactive bladder depends on the cause of your condition and whether it is mild or severe. Treatment may include:  Bladder training, such as: ? Learning to control the urge to urinate by following a schedule to urinate at regular intervals. ? Doing Kegel exercises to strengthen the pelvic floor muscles that support your bladder.  Special devices, such as: ? Biofeedback. This uses sensors to help you become aware of your body's signals. ? Electrical stimulation. This uses electrodes placed inside the body (implanted) or outside the body. These electrodes send gentle pulses of electricity to strengthen the nerves or muscles that control the bladder. ? Women may use a plastic device, called a pessary, that fits into the vagina and supports the bladder.  Medicines, such as: ? Antibiotics to treat bladder infection. ? Antispasmodics to stop the bladder from releasing urine at the wrong time. ? Tricyclic antidepressants to relax bladder muscles. ? Injections of botulinum toxin type A directly into the bladder tissue to relax bladder muscles.  Surgery, such as: ? A device may be implanted to help manage the nerve signals that control urination. ? An electrode may be implanted to stimulate electrical signals in the bladder. ? A procedure may be done to change the shape of the bladder. This is done only in very severe cases. Follow these instructions at home: Eating and drinking  Make diet or lifestyle changes recommended by your health care provider. These may include: ? Drinking fluids   throughout the day and not only with meals. ? Cutting down on caffeine or alcohol. ? Eating a healthy and balanced diet to prevent constipation. This may include:  Choosing foods that are high in fiber, such as beans, whole grains, and  fresh fruits and vegetables.  Limiting foods that are high in fat and processed sugars, such as fried and sweet foods.   Lifestyle  Lose weight if needed.  Do not use any products that contain nicotine or tobacco. These include cigarettes, chewing tobacco, and vaping devices, such as e-cigarettes. If you need help quitting, ask your health care provider.   General instructions  Take over-the-counter and prescription medicines only as told by your health care provider.  If you were prescribed an antibiotic medicine, take it as told by your health care provider. Do not stop taking the antibiotic even if you start to feel better.  Use any implants or pessary as told by your health care provider.  If needed, wear pads to absorb urine leakage.  Keep a log to track how much and when you drink, and when you need to urinate. This will help your health care provider monitor your condition.  Keep all follow-up visits. This is important. Contact a health care provider if:  You have a fever or chills.  Your symptoms do not get better with treatment.  Your pain and discomfort get worse.  You have more frequent urges to urinate. Get help right away if:  You are not able to control your bladder. Summary  Overactive bladder refers to a condition in which a person has a sudden and frequent need to urinate.  Several conditions may lead to an overactive bladder.  Treatment for overactive bladder depends on the cause and severity of your condition.  Making lifestyle changes, doing Kegel exercises, keeping a log, and taking medicines can help with this condition. This information is not intended to replace advice given to you by your health care provider. Make sure you discuss any questions you have with your health care provider. Document Revised: 02/01/2020 Document Reviewed: 02/01/2020 Elsevier Patient Education  2021 [employer]  

## 2020-09-21 NOTE — Progress Notes (Signed)
Subjective:  Patient ID: Johnathan Ross, male    DOB: May 23, 1971  Age: 50 y.o. MRN: [id number]  CC: Diabetes and Hypertension  This visit occurred during the SARS-CoV-2 public health emergency.  Safety protocols were in place, including screening questions prior to the visit, additional usage of staff PPE, and extensive cleaning of exam room while observing appropriate contact time as indicated for disinfecting solutions.    HPI Johnathan Ross presents for f/up -   He has recently had some musculoskeletal pain but no swelling in his left upper extremity.  He tells me that he told his vascular surgeon about this and they did not find anything abnormal.  He has a history of thoracic outlet syndrome.  He denies chest pain, shortness of breath, cough, hemoptysis, or diaphoresis.  He complains of frequent urination and urgency.  He has a complex ocular history and complains of intermittent, chronic episodes of redness in his right eye.  He tells me he told his ophthalmologist about it but the ophthalmologist is a retinal specialist and told him that he needed to see a general ophthalmologist.  He recently had an episode of symptomatic hypoglycemia with his blood sugar going down to 56.  Outpatient Medications Prior to Visit  Medication Sig Dispense Refill  . baclofen (LIORESAL) 10 MG tablet Take one or two pills three times a day (up to 6 pills/day) 180 each 11  . Continuous Blood Gluc Receiver (DEXCOM G5 RECEIVER KIT) DEVI by Does not apply route.    . Continuous Blood Gluc Sensor (DEXCOM G6 SENSOR) MISC Use as directed. 12 each 1  . Continuous Blood Gluc Transmit (DEXCOM G6 TRANSMITTER) MISC Use to monitor blood sugar 1 each 0  . dalfampridine (AMPYRA) 10 MG TB12 Take 1 tablet (10 mg total) by mouth 2 (two) times daily. 180 tablet 3  . glucose blood (CONTOUR NEXT TEST) test strip Use as instructed. USE AS DIRECTED 8 TIMES PER DAY. E10.65 300 strip 3  . insulin aspart (NOVOLOG) 100 UNIT/ML  injection USE UP TO 80 UNITS IN INSULIN PUMP DAILY-DX CODE E10.8 80 mL 2  . losartan (COZAAR) 50 MG tablet Take 1 tablet (50 mg total) by mouth daily. 90 tablet 3  . simvastatin (ZOCOR) 20 MG tablet TAKE 1 TABLET BY MOUTH EVERYDAY AT BEDTIME 90 tablet 0  . SYNTHROID 125 MCG tablet TAKE 1 TABLET BY MOUTH 6 DAYS PER WEEK 72 tablet 1   No facility-administered medications prior to visit.    ROS Review of Systems  Constitutional: Negative.  Negative for chills, diaphoresis, fatigue and fever.  HENT: Negative.   Eyes: Positive for redness. Negative for pain and visual disturbance.  Respiratory: Negative for cough, chest tightness, shortness of breath and wheezing.   Cardiovascular: Negative for chest pain, palpitations and leg swelling.  Gastrointestinal: Negative for abdominal pain, constipation, diarrhea, nausea and vomiting.  Endocrine: Positive for polyuria.  Genitourinary: Positive for frequency and urgency. Negative for difficulty urinating and flank pain.  Musculoskeletal: Negative for arthralgias and myalgias.  Skin: Negative.  Negative for color change.  Neurological: Negative.  Negative for dizziness, weakness and light-headedness.  Hematological: Negative for adenopathy. Does not bruise/bleed easily.  Psychiatric/Behavioral: Negative.     Objective:  BP 138/78 (BP Location: Left Arm, Patient Position: Sitting, Cuff Size: Large)   Pulse 86   Temp 98.5 F (36.9 C) (Oral)   Resp 16   Ht 6' 0.99" (1.854 m)   Wt 208 lb (94.3 kg)   SpO2  97%   BMI 27.45 kg/m   BP Readings from Last 3 Encounters:  09/21/20 124/70  09/21/20 138/78  09/06/20 (!) 154/88    Wt Readings from Last 3 Encounters:  09/21/20 208 lb 8 oz (94.6 kg)  09/21/20 208 lb (94.3 kg)  07/14/20 196 lb (88.9 kg)    Physical Exam Vitals reviewed.  HENT:     Nose: Nose normal.     Mouth/Throat:     Mouth: Mucous membranes are moist.  Eyes:     General: Lids are normal. No scleral icterus.     Conjunctiva/sclera:     Right eye: Right conjunctiva is injected. No chemosis, exudate or hemorrhage.    Left eye: Left conjunctiva is not injected. No chemosis, exudate or hemorrhage. Cardiovascular:     Rate and Rhythm: Normal rate and regular rhythm.     Heart sounds: No murmur heard.   Pulmonary:     Effort: Pulmonary effort is normal.     Breath sounds: No stridor. No wheezing, rhonchi or rales.  Abdominal:     General: Abdomen is flat.     Palpations: There is no mass.     Tenderness: There is no abdominal tenderness. There is no guarding.  Musculoskeletal:        General: No swelling, tenderness or deformity. Normal range of motion.     Cervical back: Neck supple.  Lymphadenopathy:     Cervical: No cervical adenopathy.  Skin:    General: Skin is warm and dry.  Neurological:     General: No focal deficit present.     Mental Status: He is alert.     Lab Results  Component Value Date   WBC 9.1 03/23/2020   HGB 15.5 03/23/2020   HCT 48.1 03/23/2020   PLT 281 03/23/2020   GLUCOSE 159 (H) 07/12/2020   CHOL 136 03/14/2020   TRIG 52.0 03/14/2020   HDL 46.70 03/14/2020   LDLDIRECT 139.7 04/09/2014   LDLCALC 79 03/14/2020   ALT 21 07/12/2020   AST 22 07/12/2020   NA 140 07/12/2020   K 4.2 07/12/2020   CL 105 07/12/2020   CREATININE 1.24 07/12/2020   BUN 15 07/12/2020   CO2 31 07/12/2020   TSH 1.07 07/12/2020   PSA 1.03 03/01/2020   HGBA1C 7.3 (H) 07/12/2020   MICROALBUR <0.7 03/01/2020    DG Ribs Unilateral W/Chest Left  Result Date: 05/09/2020 CLINICAL DATA:  Fall left-sided rib pain EXAM: LEFT RIBS AND CHEST - 3+ VIEW COMPARISON:  07/28/2012 FINDINGS: Single-view chest demonstrates no focal opacity or pleural effusion. Normal cardiomediastinal silhouette. No pneumothorax. Left rib series demonstrates an acute displaced right tenth anterolateral rib fracture IMPRESSION: Acute displaced right tenth anterolateral rib fracture. No pneumothorax or pleural effusion.  Electronically Signed   By: Donavan Foil M.D.   On: 05/09/2020 15:54    Assessment & Plan:   Johnathan Ross was seen today for diabetes and hypertension.  Diagnoses and all orders for this visit:  OAB (overactive bladder)- His UA is normal.  I will treat with an anticholinergic. -     Urinalysis, Routine w reflex microscopic; Future -     solifenacin (VESICARE) 5 MG tablet; Take 1 tablet (5 mg total) by mouth daily. -     Urinalysis, Routine w reflex microscopic  Essential hypertension- His blood pressure is well controlled. -     Urinalysis, Routine w reflex microscopic; Future -     Urinalysis, Routine w reflex microscopic  Thoracic outlet syndrome- No  complications noted. -     D-dimer, quantitative; Future -     D-dimer, quantitative  Conjunctival injection, right -     Ambulatory referral to Ophthalmology  Uncontrolled type 1 diabetes mellitus with hyperglycemia (HCC) -     Glucagon (GVOKE HYPOPEN 2-PACK) 1 MG/0.2ML SOAJ; Inject 1 Act into the skin daily as needed.   I am having Chistopher E. Carlota Raspberry start on solifenacin and Gvoke HypoPen 2-Pack. I am also having him maintain his dalfampridine, Dexcom G5 Receiver Kit, Dexcom G6 Sensor, Dexcom G6 Transmitter, insulin aspart, losartan, Synthroid, simvastatin, Contour Next Test, and baclofen.  Meds ordered this encounter  Medications  . solifenacin (VESICARE) 5 MG tablet    Sig: Take 1 tablet (5 mg total) by mouth daily.    Dispense:  90 tablet    Refill:  0  . Glucagon (GVOKE HYPOPEN 2-PACK) 1 MG/0.2ML SOAJ    Sig: Inject 1 Act into the skin daily as needed.    Dispense:  2 mL    Refill:  5     Follow-up: Return in about 6 months (around 03/23/2021).  Scarlette Calico, MD

## 2020-09-21 NOTE — Telephone Encounter (Signed)
Medicare order sent to GI. No auth they will reach out to the patient to schedule.  

## 2020-09-21 NOTE — Progress Notes (Signed)
[facility]  PATIENT: Johnathan Ross DOB: 1970/07/07  REFERRING DOCTOR OR PCP: Johnathan Mourning, FNP SOURCE: Patient, notes from Dr. Bjorn Ross (neurology), multiple MRI reports and images reviewed, lab reports.  _________________________________   HISTORICAL  CHIEF COMPLAINT:  Chief Complaint  Patient presents with  . Follow-up    Rm 12. Last seen 03/23/2020. On Lemtrada for MS. No new sx, no falls.    HISTORY OF PRESENT ILLNESS:  Johnathan Ross is a 50 year old man who was diagnosed with relapsing remitting MS around 2007.     Update 09/21/2020: He had his 2 years of Lemtrada therapy in 2016 and 2017.  He tolerated the infusions well but has had continued worsening of gait and spasticity.  He denies any exacerbation.     He has a lot of spasticity and takes a large dose of baclofen up to 15 pills a day (30 mg up to 5 times a day) though he is prescribed up to 6 pills a day.  Initially going up on the dose he felt a benefit but feels that he is doing no better than when he took a Lovenox.   He feels a little sleepy.   In the past he took tizanidine.  Spasticity increases when he uses his walker and lifts up with his arms.  Spasticity is also worse when he lays down at night.  He also takes dalfampridine 10 mg po bid.   He thinks it has helped.  He uses a walker for short distances but this has become more difficult and he is in a wheelchair today.  Right leg is mildly worse than the left leg. . Both legs spasm up.  He had a single series of Botox injections in November 2020 but felt weaker without any benefit of his spasticity.    Arms are strong.  He uses the walker to walk mostly by lifting with his arms.      He notes mild numbness in his feet.  There is no dysesthetic pain in the feet.  He also has Type 1 DM in 1995 and has been on insulin.  On the insulin pump his HgBA1c is usually 7's.  Before it was mostly 11-13. He notes some urinary urgency.  He denies  difficulties with his vision.  He reports some fatigue.  He sleeps well some nights but other nights wakes up.  He denies much difficulties with cognition.  He denies mood disturbance.     MS history:  In 2007, he presented with spasms in his left leg.   He saw Dr. Bjorn Ross in [place] and was started Rebif and had some progression and also had difficulty tolerating it.    He went on [place] in 2016 and had his second year in 2017.        Imaging studies: MRIs of the brain and cervical spine from 07/27/2019:  The MRI of the spine shows several T2 hyperintense foci, located posterior laterally bilaterally, right larger than left adjacent to C2-C3, laterally to the left at C3-C4, centrally at C4-C5 also associated with spinal stenosis.  The MRI of the brain shows multiple T2/FLAIR hyperintense foci in the periventricular and juxtacortical white matter and a couple punctate foci in the cerebellum.  None of the foci appear to be acute.  Other medical history: He has type 1 diabetes mellitus and is on insulin (pump).  He also has hypothyroidism and is on Synthroid.  He has well-controlled hypertension.  REVIEW OF SYSTEMS: Constitutional: No fevers,  chills, sweats, or change in appetite.  He notes some fatigue. Eyes: No visual changes, double vision, eye pain.  He has had laser eye surgery Ear, nose and throat: No hearing loss, ear pain, nasal congestion, sore throat Cardiovascular: No chest pain, palpitations Respiratory: No shortness of breath at rest or with exertion.   No wheezes GastrointestinaI: No nausea, vomiting, diarrhea, abdominal pain, fecal incontinence Genitourinary: No dysuria, urinary retention or frequency.  No nocturia. Musculoskeletal: No neck pain, back pain Integumentary: No rash, pruritus, skin lesions Neurological: as above Psychiatric: No depression at this time.  No anxiety Endocrine: No palpitations, diaphoresis, change in appetite, change in weigh or increased  thirst Hematologic/Lymphatic: No anemia, purpura, petechiae. Allergic/Immunologic: No itchy/runny eyes, nasal congestion, recent allergic reactions, rashes  ALLERGIES: Allergies  Allergen Reactions  . Lisinopril Cough    HOME MEDICATIONS:  Current Outpatient Medications:  .  baclofen (LIORESAL) 10 MG tablet, Take one or two pills three times a day (up to 6 pills/day), Disp: 180 each, Rfl: 11 .  Continuous Blood Gluc Receiver (DEXCOM G5 RECEIVER KIT) DEVI, by Does not apply route., Disp: , Rfl:  .  Continuous Blood Gluc Sensor (DEXCOM G6 SENSOR) MISC, Use as directed., Disp: 12 each, Rfl: 1 .  Continuous Blood Gluc Transmit (DEXCOM G6 TRANSMITTER) MISC, Use to monitor blood sugar, Disp: 1 each, Rfl: 0 .  dalfampridine (AMPYRA) 10 MG TB12, Take 1 tablet (10 mg total) by mouth 2 (two) times daily., Disp: 180 tablet, Rfl: 3 .  diazepam (VALIUM) 5 MG tablet, Take 1 tablet (5 mg total) by mouth every 6 (six) hours as needed for anxiety. 1/2 to 1 pill qHS, Disp: 30 tablet, Rfl: 3 .  Glucagon (GVOKE HYPOPEN 2-PACK) 1 MG/0.2ML SOAJ, Inject 1 Act into the skin daily as needed., Disp: 2 mL, Rfl: 5 .  glucose blood (CONTOUR NEXT TEST) test strip, Use as instructed. USE AS DIRECTED 8 TIMES PER DAY. E10.65, Disp: 300 strip, Rfl: 3 .  insulin aspart (NOVOLOG) 100 UNIT/ML injection, USE UP TO 80 UNITS IN INSULIN PUMP DAILY-DX CODE E10.8, Disp: 80 mL, Rfl: 2 .  losartan (COZAAR) 50 MG tablet, Take 1 tablet (50 mg total) by mouth daily., Disp: 90 tablet, Rfl: 3 .  simvastatin (ZOCOR) 20 MG tablet, TAKE 1 TABLET BY MOUTH EVERYDAY AT BEDTIME, Disp: 90 tablet, Rfl: 0 .  solifenacin (VESICARE) 5 MG tablet, Take 1 tablet (5 mg total) by mouth daily., Disp: 90 tablet, Rfl: 0 .  SYNTHROID 125 MCG tablet, TAKE 1 TABLET BY MOUTH 6 DAYS PER WEEK, Disp: 72 tablet, Rfl: 1 .  tamsulosin (FLOMAX) 0.4 MG CAPS capsule, Take 1 capsule (0.4 mg total) by mouth daily., Disp: 30 capsule, Rfl: 5  PAST MEDICAL HISTORY: Past  Medical History:  Diagnosis Date  . Hyperlipidemia   . Hypertension   . Hypogonadism in male 11/01/2016  . Hypothyroidism   . Multiple sclerosis ([place])   . Proliferative diabetic retinopathy(362.02)   . Type 1 diabetes mellitus ([place]) dx'd 1994  . Vitamin D insufficiency 11/29/2016    PAST SURGICAL HISTORY: Past Surgical History:  Procedure Laterality Date  . EYE SURGERY Bilateral    "laser for diabetic retinopathy"  . FRACTURE SURGERY    . IR VENO/EXT/UNI LEFT  03/24/2020  . OPEN REDUCTION INTERNAL FIXATION (ORIF) TIBIA/FIBULA FRACTURE Right 10/30/2016  . ORIF ANKLE FRACTURE Left 2015  . ORIF TIBIA FRACTURE Right 10/30/2016   Procedure: OPEN REDUCTION INTERNAL FIXATION (ORIF) TIBIA FIBULA FRACTURE;  Surgeon: Altamese Crittenden,  MD;  Location: [facility];  Service: Orthopedics;  Laterality: Right;  . RETINAL LASER PROCEDURE Bilateral    "for diabetic retinopathy"    FAMILY HISTORY: Family History  Problem Relation Age of Onset  . Diabetes Sister   . Colon cancer Maternal Grandmother   . Colon polyps Maternal Uncle     SOCIAL HISTORY:  Social History   Socioeconomic History  . Marital status: Single    Spouse name: 12  . Number of children: 1  . Years of education: Not on file  . Highest education level: Not on file  Occupational History  . Occupation: unemployed  Tobacco Use  . Smoking status: Never Smoker  . Smokeless tobacco: Never Used  Vaping Use  . Vaping Use: Never used  Substance and Sexual Activity  . Alcohol use: No  . Drug use: No  . Sexual activity: Not on file  Other Topics Concern  . Not on file  Social History Narrative   Right handed   No caffeine    Lives with girlfriend   Social Determinants of Health   Financial Resource Strain: Not on file  Food Insecurity: Not on file  Transportation Needs: Not on file  Physical Activity: Not on file  Stress: Not on file  Social Connections: Not on file  Intimate Partner Violence: Not on file     PHYSICAL  EXAM  Vitals:   09/21/20 1117  BP: 124/70  Pulse: 91  SpO2: 98%  Weight: 208 lb 8 oz (94.6 kg)  Height: 6' 0.99" (1.854 m)    Body mass index is 27.52 kg/m.  No exam data present  General: The patient is well-developed and well-nourished and in no acute distress   Skin: Extremities are without rash or  edema.  Neurologic Exam  Mental status: The patient is alert and oriented x 3 at the time of the examination. The patient has apparent normal recent and remote memory, with an apparently normal attention span and concentration ability.   Speech is normal.  Cranial nerves: Extraocular movements are full   Color vision was symmetric.  Facial symmetry is present.  Facial strength was normal.  Trapezius strength was normal no obvious hearing deficits are noted.  Motor:  Muscle bulk is normal.   Muscle tone is increased in the legs.  Strength is 5/5 in the arms, 3 to 4 - in the hip flexors, 4+ in the leg extenders, 4 in the gastrocnemius muscles and 3 to 4 - with ankle extensors and toe extensors  Sensory: Sensory testing is intact to pinprick, soft touch and vibration sensation in the arms but he has reduced vibration sensation and reduced touch sensation in the feet vibration sensation was just mildly reduced at the ankle.  Coordination: Cerebellar testing reveals good finger-nose-finger and unable to do  heel-to-shin bilaterally.  Gait and station: He can rise up from a chair using his arms to support..  Reflexes: Deep tendon reflexes are normal in the arms and knees and absent at the toes.  Babinski responses are extensor.    DIAGNOSTIC DATA (LABS, IMAGING, TESTING) - I reviewed patient records, labs, notes, testing and imaging myself where available.  Lab Results  Component Value Date   WBC 9.1 03/23/2020   HGB 15.5 03/23/2020   HCT 48.1 03/23/2020   MCV 90 03/23/2020   PLT 281 03/23/2020      Component Value Date/Time   NA 140 07/12/2020 0830   K 4.2 07/12/2020 0830    CL 105  07/12/2020 0830   CO2 31 07/12/2020 0830   GLUCOSE 159 (H) 07/12/2020 0830   GLUCOSE 147 (H) 05/24/2006 1022   BUN 15 07/12/2020 0830   CREATININE 1.24 07/12/2020 0830   CALCIUM 9.0 07/12/2020 0830   CALCIUM 8.7 10/31/2016 0415   PROT 7.0 07/12/2020 0830   ALBUMIN 4.1 07/12/2020 0830   AST 22 07/12/2020 0830   ALT 21 07/12/2020 0830   ALKPHOS 89 07/12/2020 0830   BILITOT 0.5 07/12/2020 0830   GFRNONAA 75 07/28/2019 0000   GFRAA 87 07/28/2019 0000   Lab Results  Component Value Date   CHOL 136 03/14/2020   HDL 46.70 03/14/2020   LDLCALC 79 03/14/2020   LDLDIRECT 139.7 04/09/2014   TRIG 52.0 03/14/2020   CHOLHDL 3 03/14/2020   Lab Results  Component Value Date   HGBA1C 7.3 (H) 07/12/2020   No results found for: VITAMINB12 Lab Results  Component Value Date   TSH 1.07 07/12/2020       ASSESSMENT AND PLAN  MULTIPLE SCLEROSIS - Plan: MR BRAIN W WO CONTRAST, MR CERVICAL SPINE W WO CONTRAST  Acquired diplegia ([place]) - Plan: MR BRAIN W WO CONTRAST, MR CERVICAL SPINE W WO CONTRAST  Gait disturbance  Essential hypertension  Urinary dysfunction  1.  Although the MS appears to be stable since doing 2 years of [place] in 2016 and 2017, he has had some progression especially with more spasticity in the legs.  His last MRI (07/27/2019) showed no new lesions.  I believe his mild worsening this year is mostly due to secondary progressive MS and unfortunately, no medications slow it down convincingly.  The diabetic polyneuropathy is not playing a major role.   2.   Continue baclofen but go back to 5 to 6 pills a day.  I will add nighttime diazepam 5 mg.. 3.   Flomax for bladder.  If no better, consider Vesicare or Ditropan. 4.   Stay active and walk as tolerated with walker 5.   He will return to see [place] in 6 months or sooner for new or worsening neurologic symptoms.  Donn Wilmot A. Felecia Shelling, MD, [facility] 1/91/5502, 7:14 PM Certified in Neurology, Clinical Neurophysiology,  Sleep Medicine and Neuroimaging  [facility] [address], [place], [place] [address] [phone]

## 2020-09-22 ENCOUNTER — Ambulatory Visit: Payer: Medicare Other | Admitting: Physical Therapy

## 2020-09-26 DIAGNOSIS — E103593 Type 1 diabetes mellitus with proliferative diabetic retinopathy without macular edema, bilateral: Secondary | ICD-10-CM | POA: Diagnosis not present

## 2020-09-26 DIAGNOSIS — H527 Unspecified disorder of refraction: Secondary | ICD-10-CM | POA: Diagnosis not present

## 2020-09-26 DIAGNOSIS — H25813 Combined forms of age-related cataract, bilateral: Secondary | ICD-10-CM | POA: Diagnosis not present

## 2020-09-26 DIAGNOSIS — H18831 Recurrent erosion of cornea, right eye: Secondary | ICD-10-CM | POA: Diagnosis not present

## 2020-09-27 ENCOUNTER — Other Ambulatory Visit: Payer: Self-pay

## 2020-09-27 ENCOUNTER — Ambulatory Visit (INDEPENDENT_AMBULATORY_CARE_PROVIDER_SITE_OTHER)
Admission: RE | Admit: 2020-09-27 | Discharge: 2020-09-27 | Disposition: A | Discharge status: Home / Self Care | Payer: Medicare Other | Source: Ambulatory Visit | Attending: Vascular Surgery | Admitting: Vascular Surgery

## 2020-09-27 ENCOUNTER — Ambulatory Visit (INDEPENDENT_AMBULATORY_CARE_PROVIDER_SITE_OTHER): Payer: Medicare Other | Admitting: Vascular Surgery

## 2020-09-27 ENCOUNTER — Ambulatory Visit: Payer: Medicare Other | Attending: Neurology | Admitting: Physical Therapy

## 2020-09-27 ENCOUNTER — Encounter: Payer: Self-pay | Admitting: Physical Therapy

## 2020-09-27 ENCOUNTER — Encounter: Payer: Self-pay | Admitting: Vascular Surgery

## 2020-09-27 ENCOUNTER — Other Ambulatory Visit (HOSPITAL_COMMUNITY): Payer: Self-pay | Admitting: Vascular Surgery

## 2020-09-27 VITALS — BP 142/82 | HR 103 | Temp 98.7°F | Resp 18 | Ht 73.0 in | Wt 208.0 lb

## 2020-09-27 DIAGNOSIS — M6281 Muscle weakness (generalized): Secondary | ICD-10-CM | POA: Insufficient documentation

## 2020-09-27 DIAGNOSIS — M79602 Pain in left arm: Secondary | ICD-10-CM

## 2020-09-27 DIAGNOSIS — G540 Brachial plexus disorders: Secondary | ICD-10-CM | POA: Diagnosis not present

## 2020-09-27 DIAGNOSIS — R2681 Unsteadiness on feet: Secondary | ICD-10-CM

## 2020-09-27 DIAGNOSIS — M21372 Foot drop, left foot: Secondary | ICD-10-CM | POA: Insufficient documentation

## 2020-09-27 DIAGNOSIS — R296 Repeated falls: Secondary | ICD-10-CM | POA: Diagnosis not present

## 2020-09-27 DIAGNOSIS — R262 Difficulty in walking, not elsewhere classified: Secondary | ICD-10-CM | POA: Insufficient documentation

## 2020-09-27 DIAGNOSIS — R2689 Other abnormalities of gait and mobility: Secondary | ICD-10-CM | POA: Diagnosis not present

## 2020-09-27 DIAGNOSIS — M21371 Foot drop, right foot: Secondary | ICD-10-CM | POA: Diagnosis not present

## 2020-09-27 NOTE — Therapy (Signed)
[facility] [address] [place], [place], [address] Phone: [phone]   Fax:  [phone]  Physical Therapy Treatment  Patient Details  Name: Johnathan Ross MRN: [id number] Date of Birth: Jan 10, 1971 Referring Provider (PT): Felecia Shelling, Nanine Means, MD   Encounter Date: 09/27/2020   PT End of Session - 09/27/20 0855    Visit Number 12    Number of Visits 17    Date for PT Re-Evaluation 11/01/20   60 day poc, 90 day cert   Authorization Type Medicare Part A and B; 10th visit PN    PT Start Time 0846    PT Stop Time 0930    PT Time Calculation (min) 44 min    Equipment Utilized During Treatment Other (comment);Gait belt   AFO (pt's on right)   Activity Tolerance Patient tolerated treatment well    Behavior During Therapy [facility] for tasks assessed/performed           Past Medical History:  Diagnosis Date  . Hyperlipidemia   . Hypertension   . Hypogonadism in male 11/01/2016  . Hypothyroidism   . Multiple sclerosis ([place])   . Proliferative diabetic retinopathy(362.02)   . Type 1 diabetes mellitus ([place]) dx'd 1994  . Vitamin D insufficiency 11/29/2016    Past Surgical History:  Procedure Laterality Date  . EYE SURGERY Bilateral    "laser for diabetic retinopathy"  . FRACTURE SURGERY    . IR VENO/EXT/UNI LEFT  03/24/2020  . OPEN REDUCTION INTERNAL FIXATION (ORIF) TIBIA/FIBULA FRACTURE Right 10/30/2016  . ORIF ANKLE FRACTURE Left 2015  . ORIF TIBIA FRACTURE Right 10/30/2016   Procedure: OPEN REDUCTION INTERNAL FIXATION (ORIF) TIBIA FIBULA FRACTURE;  Surgeon: Altamese [place], MD;  Location: [facility];  Service: Orthopedics;  Laterality: Right;  . RETINAL LASER PROCEDURE Bilateral    "for diabetic retinopathy"    There were no vitals filed for this visit.   Subjective Assessment - 09/27/20 0853    Subjective Saw Dr. Felecia Shelling, he decreased Baclofen to 3 pills 3x a day vs the 5 pills 3x a day. Started this on Wed after seeing.  [employer] called him yesterday about new brace, needs to call them back.    Pertinent History multiple falls, vitamin D insufficiency, Type 1 IDDM, diabetic retinopathy, hypothyroidism, HTN, hyperlipidemia, R tibia fracture, L ankle fracture with ORIF    Patient Stated Goals Wants to get back to working on his walking and strength.    Currently in Pain? No/denies              [facility] PT Assessment - 09/27/20 0858      Berg Balance Test   Sit to Stand Able to stand without using hands and stabilize independently   with mat elevated to get hip equal height of knees   Standing Unsupported Able to stand safely 2 minutes    Sitting with Back Unsupported but Feet Supported on Floor or Stool Able to sit safely and securely 2 minutes    Stand to Sit Controls descent by using hands    Transfers Able to transfer safely, definite need of hands    Standing Unsupported with Eyes Closed Able to stand 10 seconds with supervision    Standing Unsupported with Feet Together Able to place feet together independently and stand for 1 minute with supervision    From Standing, Reach Forward with Outstretched Arm Can reach confidently >25 cm (10")    From Standing Position, Pick up Object from Floor Able to  pick up shoe, needs supervision    From Standing Position, Turn to Look Behind Over each Shoulder Turn sideways only but maintains balance    Turn 360 Degrees Needs assistance while turning    Standing Unsupported, Alternately Place Feet on Step/Stool Able to complete >2 steps/needs minimal assist    Standing Unsupported, One Foot in Front Needs help to step but can hold 15 seconds    Standing on One Leg Tries to lift leg/unable to hold 3 seconds but remains standing independently    Total Score 36    Berg comment: 36/56 <36= high risk for falls               [facility] Adult PT Treatment/Exercise - 09/27/20 0858      Transfers   Transfers Sit to Stand;Stand to Sit    Sit to Stand 6: Modified independent  (Device/Increase time)    Stand to Sit 6: Modified independent (Device/Increase time)      Ambulation/Gait   Ambulation/Gait Yes    Ambulation/Gait Assistance 5: Supervision;4: Min guard    Ambulation/Gait Assistance Details cues for working on hip/knee flexion with swing phase. pt needing to stop several time due to bil LE's locking out. use of RW to check goal for gait speed and to enter/exit session. Will return to bil forearm crutches next session.    Ambulation Distance (Feet) 80 Feet   x2   Assistive device Rolling walker    Gait Pattern Step-through pattern;Decreased hip/knee flexion - right;Decreased hip/knee flexion - left;Poor foot clearance - right    Ambulation Surface Level;Indoor    Gait velocity 70.72 sec's= 0.14 m/sec's with increased extensor tone in LE's right>left                PT Short Term Goals - 09/02/20 0854      PT SHORT TERM GOAL #1   Title Patient will demonstrate independence with updated HEP for strengthening, balance and flexibility.    Baseline 09/02/20: pt reprots no issues and still challenging him    Status Achieved    Target Date --      PT SHORT TERM GOAL #2   Title Pt will increase speed from 0.20ms (0.695fsec) to >0.8m105mfor improved mobility.    Baseline 08/30/20 0.46m18m  Time --    Period --    Status Not Met    Target Date --      PT SHORT TERM GOAL #3   Title Pt will improve Berg from 25 to 31/56 or greater for improved balance.    Baseline 08/03/20 25/56    Time 4    Period Weeks    Status On-going    Target Date 09/02/20      PT SHORT TERM GOAL #4   Title Pt will ambulate > 200' with RW mod I over level surfaces inside for improved mobility.    Baseline 08/30/20 250' with RW mod I on level indoor surfaces.    Status Achieved    Target Date --      PT SHORT TERM GOAL #5   Title Pt will be further asssessed to determine if there is need for left AFO and/or toe cap.    Baseline 08/30/20 Pt is showing improved gait quality with  left AFO. PT requesting order for one    Status Achieved    Target Date 09/02/20             PT Long Term Goals - 09/27/20 [id number]  PT LONG TERM GOAL #1   Title Pt will ambulate >300' with bilateral loftstrand crutches for improved mobility on level paved or grass surfaces to be able to get to his buidling out back safely CGA.    Time 8    Period Weeks    Status On-going      PT LONG TERM GOAL #2   Title Pt will increase gait speed to >0.46ms  for improved gait safety.    Baseline 09/27/20: 0.14 m/s    Time --    Period --    Status Not Met      PT LONG TERM GOAL #3   Title Pt will improve BERG to >/= 37/56 to decrease falls risk    Baseline 09/27/20: 36/56 scored today, improved just not to goal level    Time --    Period --    Status Partially Met      PT LONG TERM GOAL #4   Title Pt will ambulate up/down 6 steps with rails mod I to safetly enter/exit home.    Time 8    Period Weeks    Status On-going                 Plan - 09/27/20 0855    Clinical Impression Statement Today's skilled session began to check progress toward LTGs with pt increasing score on Berg Balance Test to 36/56, just not to goal level. Pt's 10 meter gait speed decreased to 0.14 m/sec (was 0.17 m/sec at last check), not meeting this goal. Will plan to check remainder of goals at next session.    Personal Factors and Comorbidities Comorbidity 3+;Time since onset of injury/illness/exacerbation;Past/Current Experience    Comorbidities multiple falls, vitamin D insufficiency, Type 1 IDDM, diabetic retinopathy, hypothyroidism, HTN, hyperlipidemia, R tibia fracture, L ankle fracture with ORIF    Examination-Activity Limitations Carry;Locomotion Level;Stairs;Stand;Transfers;Squat    Examination-Participation Restrictions Community Activity;Driving;Yard Work    SMerchant navy officerEvolving/Moderate complexity    Rehab Potential Good    PT Frequency 2x / week   plus eval   PT Duration 8  weeks    PT Treatment/Interventions ADLs/Self Care Home Management;Aquatic Therapy;Electrical Stimulation;DME Instruction;Gait training;Stair training;Functional mobility training;Therapeutic activities;Therapeutic exercise;Balance training;Neuromuscular re-education;Patient/family education;Orthotic Fit/Training;Passive range of motion;Energy conservation;Manual techniques    PT Next Visit Plan check remaining LTGs for recert and send to [employer] has he called [employer] back?  continue with use of left posterior [place] AFO. Gait with bilateral  loftstrand crutches as able (he has them, has not used them). continue to work on ankle motions/strengthening, hip/core strengthening and standing balance toward LTGs.    Consulted and Agree with Plan of Care Patient           Patient will benefit from skilled therapeutic intervention in order to improve the following deficits and impairments:  Abnormal gait,Decreased balance,Decreased endurance,Decreased coordination,Decreased range of motion,Decreased strength,Difficulty walking,Impaired tone,Postural dysfunction,Decreased activity tolerance,Decreased mobility,Impaired flexibility,Increased edema  Visit Diagnosis: Other abnormalities of gait and mobility  Muscle weakness (generalized)  Unsteadiness on feet  Repeated falls     Problem List Patient Active Problem List   Diagnosis Date Noted  . OAB (overactive bladder) 09/21/2020  . Conjunctival injection, right 09/21/2020  . Essential hypertension 09/21/2020  . Urinary dysfunction 09/21/2020  . Thoracic outlet syndrome 05/03/2020  . Controlled type 1 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 ([place] 03/24/2020  . Right epiretinal membrane 03/24/2020  . Retinal exudates and deposits 03/24/2020  . 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 Nuclear sclerotic cataract of both eyes 03/24/2020  . Gait disorder 03/23/2020  . Benign prostatic hyperplasia  without lower urinary tract symptoms 03/01/2020  . Diabetic gastroparesis associated with type 1 diabetes mellitus ([place]) 03/01/2020  . Uncontrolled type 1 diabetes mellitus with hyperglycemia ([place]) 03/01/2020  . Gait disturbance 08/19/2019  . Acquired diplegia ([place]) 08/19/2019  . Vitamin D insufficiency 11/29/2016  . Hypogonadism in male 11/01/2016  . 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[place])   . Hypothyroidism   . Hypertension   . Abnormal liver function tests 03/06/2013  . Type I (juvenile type) diabetes mellitus without mention of complication, uncontrolled 12/03/2007  . HYPERCHOLESTEROLEMIA 11/12/2007  . MULTIPLE SCLEROSIS 08/29/2007  . PROLIFERATIVE DIABETIC RETINOPATHY 08/29/2007    Willow Ora, PTA, [place] [address] [place], [place] [address] [phone] 09/27/20, 2:15 PM   Name: VALERIE FREDIN MRN: [id number] Date of Birth: [birth year]

## 2020-09-27 NOTE — Progress Notes (Signed)
Patient name: Johnathan Ross MRN: [id number] DOB: 1970-09-18 Sex: male  REASON FOR CONSULT: Re-evaluate left upper extremity with concern for recurrent DVT  HPI: Johnathan Ross is a 50 y.o. male, with history of type 1 diabetes and multiple sclerosis who presents for re-evaluation of left upper extremity.  Patient called triage and was concerned that he could have recurrent DVT in his left arm.  He states he has been having pain in the arm that was very similar to the pain he had during his prior blood clot.  Most of his pain is around the elbow particularly worse at night.  Has been previously evaluated for venous thoracic outlet syndrome in his left upper extremity.  Patient had a left axillary subclavian vein DVT diagnosed on 02/21/2019.  He was on 6 months of Xarelto.  He had been seen by hematology Dr. Lorenso Courier who ordered a left upper extremity venogram with IR that was done.  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  He had no significant arm swelling at the time of initial evaluation.  He walks with a walker due to his MS.    Past Medical History:  Diagnosis Date  . Hyperlipidemia   . Hypertension   . Hypogonadism in male 11/01/2016  . Hypothyroidism   . Multiple sclerosis ([place])   . Proliferative diabetic retinopathy(362.02)   . Type 1 diabetes mellitus ([place]) dx'd 1994  . Vitamin D insufficiency 11/29/2016    Past Surgical History:  Procedure Laterality Date  . EYE SURGERY Bilateral    "laser for diabetic retinopathy"  . FRACTURE SURGERY    . IR VENO/EXT/UNI LEFT  03/24/2020  . OPEN REDUCTION INTERNAL FIXATION (ORIF) TIBIA/FIBULA FRACTURE Right 10/30/2016  . ORIF ANKLE FRACTURE Left 2015  . ORIF TIBIA FRACTURE Right 10/30/2016   Procedure: OPEN REDUCTION INTERNAL FIXATION (ORIF) TIBIA FIBULA FRACTURE;  Surgeon: Altamese Belfield, MD;  Location: [facility];   Service: Orthopedics;  Laterality: Right;  . RETINAL LASER PROCEDURE Bilateral    "for diabetic retinopathy"    Family History  Problem Relation Age of Onset  . Diabetes Sister   . Colon cancer Maternal Grandmother   . Colon polyps Maternal Uncle     SOCIAL HISTORY: Social History   Socioeconomic History  . Marital status: Single    Spouse name: 12  . Number of children: 1  . Years of education: Not on file  . Highest education level: Not on file  Occupational History  . Occupation: unemployed  Tobacco Use  . Smoking status: Never Smoker  . Smokeless tobacco: Never Used  Vaping Use  . Vaping Use: Never used  Substance and Sexual Activity  . Alcohol use: No  . Drug use: No  . Sexual activity: Not on file  Other Topics Concern  . Not on file  Social History Narrative   Right handed   No caffeine    Lives with girlfriend   Social Determinants of Health   Financial Resource Strain: Not on file  Food Insecurity: Not on file  Transportation Needs: Not on file  Physical Activity: Not on file  Stress: Not on file  Social Connections: Not on file  Intimate Partner Violence: Not on file    Allergies  Allergen Reactions  . Lisinopril Cough    Current Outpatient Medications  Medication Sig Dispense Refill  . baclofen (LIORESAL) 10 MG tablet  Take one or two pills three times a day (up to 6 pills/day) 180 each 11  . Continuous Blood Gluc Receiver (DEXCOM G5 RECEIVER KIT) DEVI by Does not apply route.    . Continuous Blood Gluc Sensor (DEXCOM G6 SENSOR) MISC Use as directed. 12 each 1  . Continuous Blood Gluc Transmit (DEXCOM G6 TRANSMITTER) MISC Use to monitor blood sugar 1 each 0  . dalfampridine (AMPYRA) 10 MG TB12 Take 1 tablet (10 mg total) by mouth 2 (two) times daily. 180 tablet 3  . diazepam (VALIUM) 5 MG tablet Take 1 tablet (5 mg total) by mouth every 6 (six) hours as needed for anxiety. 1/2 to 1 pill qHS 30 tablet 3  . Glucagon (GVOKE HYPOPEN 2-PACK) 1  MG/0.2ML SOAJ Inject 1 Act into the skin daily as needed. 2 mL 5  . glucose blood (CONTOUR NEXT TEST) test strip Use as instructed. USE AS DIRECTED 8 TIMES PER DAY. E10.65 300 strip 3  . insulin aspart (NOVOLOG) 100 UNIT/ML injection USE UP TO 80 UNITS IN INSULIN PUMP DAILY-DX CODE E10.8 80 mL 2  . losartan (COZAAR) 50 MG tablet Take 1 tablet (50 mg total) by mouth daily. 90 tablet 3  . simvastatin (ZOCOR) 20 MG tablet TAKE 1 TABLET BY MOUTH EVERYDAY AT BEDTIME 90 tablet 0  . solifenacin (VESICARE) 5 MG tablet Take 1 tablet (5 mg total) by mouth daily. 90 tablet 0  . SYNTHROID 125 MCG tablet TAKE 1 TABLET BY MOUTH 6 DAYS PER WEEK 72 tablet 1  . tamsulosin (FLOMAX) 0.4 MG CAPS capsule Take 1 capsule (0.4 mg total) by mouth daily. 30 capsule 5   No current facility-administered medications for this visit.    REVIEW OF SYSTEMS:  _0  denotes positive finding, _1  denotes negative finding Cardiac  Comments:  Chest pain or chest pressure:    Shortness of breath upon exertion:    Short of breath when lying flat:    Irregular heart rhythm:        Vascular    Pain in calf, thigh, or hip brought on by ambulation:    Pain in feet at night that wakes you up from your sleep:     Blood clot in your veins:    Leg swelling:         Pulmonary    Oxygen at home:    Productive cough:     Wheezing:         Neurologic    Sudden weakness in arms or legs:     Sudden numbness in arms or legs:     Sudden onset of difficulty speaking or slurred speech:    Temporary loss of vision in one eye:     Problems with dizziness:         Gastrointestinal    Blood in stool:     Vomited blood:         Genitourinary    Burning when urinating:     Blood in urine:        Psychiatric    Major depression:         Hematologic    Bleeding problems:    Problems with blood clotting too easily:        Skin    Rashes or ulcers:        Constitutional    Fever or chills:      PHYSICAL EXAM: Vitals:    09/27/20 1334  BP: (!) 142/82  Pulse: (!) 103  Resp: 18  Temp: 98.7 F (37.1 C)  TempSrc: Temporal  SpO2: 97%  Weight: 208 lb (94.3 kg)  Height: _0  (1.854 m)    GENERAL: The patient is a well-nourished male, in no acute distress. The vital signs are documented above. CARDIAC: There is a regular rate and rhythm.  VASCULAR:  Left radial pulse 2+ palpable No significant left arm edema PULMONARY: No respiratory distress. ABDOMEN: Soft and non-tender MUSCULOSKELETAL: There are no major deformities or cyanosis. NEUROLOGIC: No focal weakness or paresthesias are detected. PSYCHIATRIC: The patient has a normal affect.  DATA:   Previous IR venogram shows his left subclavian vein is chronically occluded with multiple collaterals in the chest wall and filling of the IJ on the left through collaterals  Assessment/Plan:  50 year old male sent initially for evaluation of venous TOS after he presented with a left axillary subclavian vein DVT in September 2020.  I previously reviewed with him and his sister that I did think his initial presentation was consistent with Paget Schroetter for venous TOS.  I did not offer intervention given we saw him greater than a year after his initial DVT and he had completed anticoagulation with no significant symptoms in the left arm and no significant swelling.  I did not really see the benefit for first rib resection etc without more symptoms to warrant.  His main concern today was that he had a new clot on the left arm.  We did a venous DVT study in the office and there is no evidence of recurrent DVT in the left arm.  He does have some numbness in the arm around the elbow and not sure if this could be from repetitive trauma as it relates to using his upper body to walk with a walker versus some component of neurogenic TOS.  He feels this is tolerable and we will continue to monitor.  He has previously been referred to PT.     Marty Heck,  MD [facility] Office: [phone]

## 2020-09-29 ENCOUNTER — Ambulatory Visit: Payer: Medicare Other | Admitting: Physical Therapy

## 2020-09-29 ENCOUNTER — Encounter: Payer: Self-pay | Admitting: Physical Therapy

## 2020-09-29 ENCOUNTER — Other Ambulatory Visit: Payer: Self-pay

## 2020-09-29 DIAGNOSIS — R296 Repeated falls: Secondary | ICD-10-CM

## 2020-09-29 DIAGNOSIS — R2681 Unsteadiness on feet: Secondary | ICD-10-CM | POA: Diagnosis not present

## 2020-09-29 DIAGNOSIS — M21372 Foot drop, left foot: Secondary | ICD-10-CM | POA: Diagnosis not present

## 2020-09-29 DIAGNOSIS — M6281 Muscle weakness (generalized): Secondary | ICD-10-CM | POA: Diagnosis not present

## 2020-09-29 DIAGNOSIS — R2689 Other abnormalities of gait and mobility: Secondary | ICD-10-CM | POA: Diagnosis not present

## 2020-09-29 DIAGNOSIS — R262 Difficulty in walking, not elsewhere classified: Secondary | ICD-10-CM | POA: Diagnosis not present

## 2020-10-02 NOTE — Therapy (Addendum)
[facility] [address] [place], [place], [address] Phone: [phone]   Fax:  [phone]  Physical Therapy Treatment/Recertification  Patient Details  Name: Johnathan Ross MRN: [id number] Date of Birth: 09/20/1970 Referring Provider (PT): Britt Bottom, MD   Encounter Date: 09/29/2020   09/29/20 0854  PT Visits / Re-Eval  Visit Number 13  Number of Visits 25  Date for PT Re-Evaluation 12/01/20   Authorization  Authorization Type Medicare Part A and B; 10th visit PN  PT Time Calculation  PT Start Time 0846  PT Stop Time 0930  PT Time Calculation (min) 44 min  PT - End of Session  Equipment Utilized During Treatment Other (comment);Gait belt (AFO (pt's on right))  Activity Tolerance Patient tolerated treatment well  Behavior During Therapy [facility] for tasks assessed/performed     Past Medical History:  Diagnosis Date  . Hyperlipidemia   . Hypertension   . Hypogonadism in male 11/01/2016  . Hypothyroidism   . Multiple sclerosis ([place])   . Proliferative diabetic retinopathy(362.02)   . Type 1 diabetes mellitus ([place]) dx'd 1994  . Vitamin D insufficiency 11/29/2016    Past Surgical History:  Procedure Laterality Date  . EYE SURGERY Bilateral    "laser for diabetic retinopathy"  . FRACTURE SURGERY    . IR VENO/EXT/UNI LEFT  03/24/2020  . OPEN REDUCTION INTERNAL FIXATION (ORIF) TIBIA/FIBULA FRACTURE Right 10/30/2016  . ORIF ANKLE FRACTURE Left 2015  . ORIF TIBIA FRACTURE Right 10/30/2016   Procedure: OPEN REDUCTION INTERNAL FIXATION (ORIF) TIBIA FIBULA FRACTURE;  Surgeon: Altamese Maurice, MD;  Location: [facility];  Service: Orthopedics;  Laterality: Right;  . RETINAL LASER PROCEDURE Bilateral    "for diabetic retinopathy"    There were no vitals filed for this visit.      09/29/20 0852  Symptoms/Limitations  Subjective No new complatins. Feels no different with the decreaed Baclofen doses, has been on this  dose for about a week now. Has also started drinking Propel water to get more electrolytes and has noticed a decrease in cramping.  Pertinent History multiple falls, vitamin D insufficiency, Type 1 IDDM, diabetic retinopathy, hypothyroidism, HTN, hyperlipidemia, R tibia fracture, L ankle fracture with ORIF  Patient Stated Goals Wants to get back to working on his walking and strength.  Pain Assessment  Currently in Pain? No/denies  Pain Score 0           09/29/20 0854  Transfers  Transfers Sit to Stand;Stand to Sit  Sit to Stand 6: Modified independent (Device/Increase time)  Stand to Sit 6: Modified independent (Device/Increase time)  Ambulation/Gait  Ambulation/Gait Yes  Ambulation/Gait Assistance 5: Supervision;4: Min guard;4: Min assist  Ambulation/Gait Assistance Details supervision with RW around gym. min guard to min assist with use of forearm crutches on indoor/outdoor surfaces with increased assistance needed on the inclines/declines. several short standing rest breaks needed to relax LE's, left>right due to extensor tone.  Ambulation Distance (Feet) 300 Feet (x1 in/outdoor, plus around gym with session)  Assistive device Rolling walker;Lofstrands  Gait Pattern Step-through pattern;Decreased hip/knee flexion - right;Decreased hip/knee flexion - left;Poor foot clearance - right  Ambulation Surface Level;Unlevel;Indoor;Outdoor;Paved  Gait velocity 148 sec's = 0.07 m/sec  Stairs Yes  Stairs Assistance 4: Min guard;5: Supervision  Stairs Assistance Details (indicate cue type and reason) increased time and effort, min guard with descent on 1st rep, supervision on 2cde rep  Stair Management Technique Two rails;Alternating pattern;Forwards  Number of Stairs 4 (x 2 reps)  Height of Stairs 6         PT Short Term Goals - 09/02/20 0854      PT SHORT TERM GOAL #1   Title Patient will demonstrate independence with updated HEP for strengthening, balance and flexibility.     Baseline 09/02/20: pt reprots no issues and still challenging him    Status Achieved    Target Date --      PT SHORT TERM GOAL #2   Title Pt will increase speed from 0.37m/s (0.27ft/sec) to >0.76m/s for improved mobility.    Baseline 08/30/20 0.42m/s    Time --    Period --    Status Not Met    Target Date --      PT SHORT TERM GOAL #3   Title Pt will improve Berg from 25 to 31/56 or greater for improved balance.    Baseline 08/03/20 25/56    Time 4    Period Weeks    Status On-going    Target Date 09/02/20      PT SHORT TERM GOAL #4   Title Pt will ambulate > 200' with RW mod I over level surfaces inside for improved mobility.    Baseline 08/30/20 250' with RW mod I on level indoor surfaces.    Status Achieved    Target Date --      PT SHORT TERM GOAL #5   Title Pt will be further asssessed to determine if there is need for left AFO and/or toe cap.    Baseline 08/30/20 Pt is showing improved gait quality with left AFO. PT requesting order for one    Status Achieved    Target Date 09/02/20            PT Long Term Goals - 09/29/20 0857      PT LONG TERM GOAL #1   Title Pt will ambulate >300' with bilateral loftstrand crutches for improved mobility on level paved or grass surfaces to be able to get to his buidling out back safely CGA.    Baseline 09/29/20: min guard to min assist currently    Time --    Period --    Status Partially Met      PT LONG TERM GOAL #2   Title Pt will increase gait speed to >0.2m/s  for improved gait safety.    Baseline 09/27/20: 0.14 m/s    Status Not Met      PT LONG TERM GOAL #3   Title Pt will improve BERG to >/= 37/56 to decrease falls risk    Baseline 09/27/20: 36/56 scored today, improved just not to goal level    Status Partially Met      PT LONG TERM GOAL #4   Title Pt will ambulate up/down 6 steps with rails mod I to safetly enter/exit home.    Baseline 09/29/20: supervision to min guard assist for stair negotiation    Time --    Period --     Status Not Met          Updated PT goals addended by PT:  PT Short Term Goals - 10/05/20 1951      PT SHORT TERM GOAL #1   Title Pt will ambulate up/down 6 stairs with railing mod I to safely enter home.    Time 4    Period Weeks    Status New    Target Date 11/01/20      PT SHORT TERM GOAL #2   Title Pt  will be instructed on proper wear time when new AFO is received.    Baseline --    Time 4    Period Weeks    Status On-going    Target Date 11/01/20      PT SHORT TERM GOAL #3   Title --    Time --    Period --    Status --    Target Date --           PT Long Term Goals - 10/05/20 1955      PT LONG TERM GOAL #1   Title Pt will ambulate >300' with bilateral loftstrand crutches for improved mobility on level paved or grass surfaces to be able to get to his buidling out back safely CGA. ( LTGs due 11/25/20)    Baseline 09/29/20: min guard to min assist currently    Time 8    Period Weeks    Status On-going    Target Date 11/25/20      PT LONG TERM GOAL #2   Title Pt will increase Berg from 36/56 to >38/56 for improved balance.    Time 8    Period Weeks    Status Revised    Target Date 11/25/20      PT LONG TERM GOAL #3   Title Pt will increase speed from 0.56ms (0.665fsec) to >0.93m67mfor improved mobility with bilateral AFOs and walker.    Time 8    Period Weeks    Status On-going    Target Date 11/25/20              09/29/20 0854  Plan  Clinical Impression Statement Today's skilled session focused on progress toward remaining goals .Pt partially met the gait with forarm crutches and stair goals this session. Primary PT to complete recert. The pt should benefit from continued PT to progress balance reactions and gait with forearm crutches. Primary PT plans to recert to continue to address balance and gait.  Personal Factors and Comorbidities Comorbidity 3+;Time since onset of injury/illness/exacerbation;Past/Current Experience  Comorbidities multiple  falls, vitamin D insufficiency, Type 1 IDDM, diabetic retinopathy, hypothyroidism, HTN, hyperlipidemia, R tibia fracture, L ankle fracture with ORIF  Examination-Activity Limitations Carry;Locomotion Level;Stairs;Stand;Transfers;Squat  Examination-Participation Restrictions Community Activity;Driving;Yard Work  Pt will benefit from skilled therapeutic intervention in order to improve on the following deficits Abnormal gait;Decreased balance;Decreased endurance;Decreased coordination;Decreased range of motion;Decreased strength;Difficulty walking;Impaired tone;Postural dysfunction;Decreased activity tolerance;Decreased mobility;Impaired flexibility;Increased edema  Stability/Clinical Decision Making Evolving/Moderate complexity  Rehab Potential Good  PT Frequency 2x / week (followed by 1x/week for 4 weeks)  PT Duration 4 weeks  PT Treatment/Interventions ADLs/Self Care Home Management;Aquatic Therapy;Electrical Stimulation;DME Instruction;Gait training;Stair training;Functional mobility training;Therapeutic activities;Therapeutic exercise;Balance training;Neuromuscular re-education;Patient/family education;Orthotic Fit/Training;Passive range of motion;Energy conservation;Manual techniques  PT Next Visit Plan has he called [employer] back?  continue with use of left posterior [place] AFO. Gait with bilateral  loftstrand crutches as able (he has them, has not used them). continue to work on ankle motions/strengthening toward updated goals of recert.  Consulted and Agree with Plan of Care Patient          Patient will benefit from skilled therapeutic intervention in order to improve the following deficits and impairments:  Abnormal gait,Decreased balance,Decreased endurance,Decreased coordination,Decreased range of motion,Decreased strength,Difficulty walking,Impaired tone,Postural dysfunction,Decreased activity tolerance,Decreased mobility,Impaired flexibility,Increased edema  Visit Diagnosis: Other  abnormalities of gait and mobility  Muscle weakness (generalized)  Unsteadiness on feet  Repeated falls     Problem List Patient Active Problem List  Diagnosis Date Noted  . OAB (overactive bladder) 09/21/2020  . Conjunctival injection, right 09/21/2020  . Essential hypertension 09/21/2020  . Urinary dysfunction 09/21/2020  . Thoracic outlet syndrome 05/03/2020  . Controlled type 1 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 ([place]) 03/24/2020  . Right epiretinal membrane 03/24/2020  . Retinal exudates and deposits 03/24/2020  . 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 Nuclear sclerotic cataract of both eyes 03/24/2020  . Gait disorder 03/23/2020  . Benign prostatic hyperplasia without lower urinary tract symptoms 03/01/2020  . Diabetic gastroparesis associated with type 1 diabetes mellitus ([place]) 03/01/2020  . Uncontrolled type 1 diabetes mellitus with hyperglycemia ([place]) 03/01/2020  . Gait disturbance 08/19/2019  . Acquired diplegia ([place]) 08/19/2019  . Vitamin D insufficiency 11/29/2016  . Hypogonadism in male 11/01/2016  . 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[place])   . Hypothyroidism   . Hypertension   . Abnormal liver function tests 03/06/2013  . Type I (juvenile type) diabetes mellitus without mention of complication, uncontrolled 12/03/2007  . HYPERCHOLESTEROLEMIA 11/12/2007  . MULTIPLE SCLEROSIS 08/29/2007  . PROLIFERATIVE DIABETIC RETINOPATHY 08/29/2007    Willow Ora, PTA, [facility] [address] [place],  [address] [phone] 10/02/20, 4:30 PM  Cherly Anderson, PT, DPT, NCS  Name: Johnathan Ross MRN: [id number] Date of Birth: 26-Aug-1970

## 2020-10-04 ENCOUNTER — Encounter: Payer: Self-pay | Admitting: Physical Therapy

## 2020-10-04 ENCOUNTER — Other Ambulatory Visit: Payer: Self-pay

## 2020-10-04 ENCOUNTER — Ambulatory Visit: Payer: Medicare Other | Admitting: Physical Therapy

## 2020-10-04 DIAGNOSIS — M21371 Foot drop, right foot: Secondary | ICD-10-CM

## 2020-10-04 DIAGNOSIS — R2689 Other abnormalities of gait and mobility: Secondary | ICD-10-CM

## 2020-10-04 DIAGNOSIS — M21372 Foot drop, left foot: Secondary | ICD-10-CM | POA: Diagnosis not present

## 2020-10-04 DIAGNOSIS — M6281 Muscle weakness (generalized): Secondary | ICD-10-CM

## 2020-10-04 DIAGNOSIS — R262 Difficulty in walking, not elsewhere classified: Secondary | ICD-10-CM | POA: Diagnosis not present

## 2020-10-04 DIAGNOSIS — R296 Repeated falls: Secondary | ICD-10-CM | POA: Diagnosis not present

## 2020-10-04 DIAGNOSIS — R2681 Unsteadiness on feet: Secondary | ICD-10-CM

## 2020-10-04 NOTE — Therapy (Signed)
[facility] [address] [place], [place], [address] Phone: [phone]   Fax:  [phone]  Physical Therapy Treatment  Patient Details  Name: Johnathan Ross MRN: [id number] Date of Birth: [birth year] Referring Provider (PT): Britt Bottom, MD   Encounter Date: 10/04/2020   PT End of Session - 10/04/20 0858    Visit Number 14    Number of Visits --    Date for PT Re-Evaluation --    Authorization Type Medicare Part A and B; 10th visit PN    Progress Note Due on Visit 20    PT Start Time 0855    PT Stop Time 0940    PT Time Calculation (min) 45 min    Equipment Utilized During Treatment Other (comment)   lofstrands   Activity Tolerance Patient tolerated treatment well    Behavior During Therapy [facility] for tasks assessed/performed           Past Medical History:  Diagnosis Date  . Hyperlipidemia   . Hypertension   . Hypogonadism in male 11/01/2016  . Hypothyroidism   . Multiple sclerosis ([place])   . Proliferative diabetic retinopathy(362.02)   . Type 1 diabetes mellitus ([place]) dx'd 1994  . Vitamin D insufficiency 11/29/2016    Past Surgical History:  Procedure Laterality Date  . EYE SURGERY Bilateral    "laser for diabetic retinopathy"  . FRACTURE SURGERY    . IR VENO/EXT/UNI LEFT  03/24/2020  . OPEN REDUCTION INTERNAL FIXATION (ORIF) TIBIA/FIBULA FRACTURE Right 10/30/2016  . ORIF ANKLE FRACTURE Left 2015  . ORIF TIBIA FRACTURE Right 10/30/2016   Procedure: OPEN REDUCTION INTERNAL FIXATION (ORIF) TIBIA FIBULA FRACTURE;  Surgeon: Altamese Bloomingdale, MD;  Location: [facility];  Service: Orthopedics;  Laterality: Right;  . RETINAL LASER PROCEDURE Bilateral    "for diabetic retinopathy"    There were no vitals filed for this visit.   Subjective Assessment - 10/04/20 0859    Subjective No further edema in LUE; still sore.  Will monitor and let physician know if it returns.  Ultrasound did not show a new DVT.  Walking  more with loftstrand crutches at home, was a good work out.  Sore in L bicep tendon when lifting or holding items for a long period of time.  No tenderness to palpation though.  Pain with resisted elbow flexion with elbow pronated.  [facility] called but pt has not called back.  Pt is hesitant to get second AFO.    Pertinent History multiple falls, vitamin D insufficiency, Type 1 IDDM, diabetic retinopathy, hypothyroidism, HTN, hyperlipidemia, R tibia fracture, L ankle fracture with ORIF    Patient Stated Goals Wants to get back to working on his walking and strength.    Currently in Pain? Yes                             [facility] Adult PT Treatment/Exercise - 10/04/20 1054      Ambulation/Gait   Ambulation/Gait Yes    Ambulation/Gait Assistance 4: Min guard    Ambulation/Gait Assistance Details with loftstrand crutches; before treatment pt demonstrated increased genu recurvatum on RLE and difficulty advancing LLE due to toe drag.  Following treatment in // bars cued pt to focus on weight shift forward and stance phase control with pt demonstrating decreased trunk flexion and genu recurvatum in stance phase and improved hip/knee flexion and LE clearance and advancement during swing phase bilaterally.  Recommended that  pt practice "conscious walking" at home in hallway on straight, flat area and not when walking to building due to increased focus needed for balance/safety.    Ambulation Distance (Feet) 115 Feet    Assistive device Lofstrands    Gait Pattern Step-to pattern;Step-through pattern;Decreased step length - left;Decreased stance time - right;Decreased stride length;Decreased hip/knee flexion - right;Decreased hip/knee flexion - left;Decreased dorsiflexion - left;Decreased dorsiflexion - right;Left hip hike;Right genu recurvatum;Poor foot clearance - left    Ambulation Surface Level;Indoor      Therapeutic Activites    Therapeutic Activities Other Therapeutic Activities       Neuro Re-ed    Neuro Re-ed Details  Following brief assessment of LUE educated pt on symptoms of tendonopathy; will continue to monitor in therapy and with activity.  Symptoms do not appear to be related to recent use of loftstrand crutches.               Balance Exercises - 10/04/20 1100      Balance Exercises: Standing   Rockerboard Anterior/posterior;Head turns;EO;5 reps;Intermittent UE support;Limitations    Rockerboard Limitations Standing with feet apart on rockerboard with decreased UE support and performing slow head movements R, L, up, down returning to center between each direction and cues to maintain balance in center.    Step Ups Forward;UE support 2;Limitations    Step Ups Limitations Performed step ups to rockboard ant/post.  Placed one foot on rockerboard and focused on shifting weight forwards to stance LE and activation of hamstring and ankle DF for stability in stance phase to allow contralateral LE to advance.  Therapist provided facilitation and assistance to RLE during stance phase for full anterior weight shift and then during swing phase to initiate hip and knee flexion.  Performed 10 reps each side; 2 sets standing on LLE.    Other Standing Exercises Standing on rockerboard ant/post performing small squats in center, off set to R and off set to L focusing on weight shift posteriorly and use of hip strategy for balance and decreasing compensation of knees forwards and trunk lean forwards.  Performed 10 reps each attempting to return to standing with LE only; intermittent assistance from therapist to maintain COG posteriorly during squat and to prevent LOB forwards.             PT Education - 10/04/20 1115    Education Details new appointment on Monday at 8:45; symptoms of tendonopathy - will continue to monitor, walking at home with lofstrands    Person(s) Educated Patient    Methods Explanation    Comprehension Verbalized understanding            PT Short  Term Goals - 09/02/20 0854      PT SHORT TERM GOAL #1   Title Patient will demonstrate independence with updated HEP for strengthening, balance and flexibility.    Baseline 09/02/20: pt reprots no issues and still challenging him    Status Achieved    Target Date --      PT SHORT TERM GOAL #2   Title Pt will increase speed from 0.74ms (0.639fsec) to >0.10m310mfor improved mobility.    Baseline 08/30/20 0.43m21m  Time --    Period --    Status Not Met    Target Date --      PT SHORT TERM GOAL #3   Title Pt will improve Berg from 25 to 31/56 or greater for improved balance.    Baseline 08/03/20 25/56  Time 4    Period Weeks    Status On-going    Target Date 09/02/20      PT SHORT TERM GOAL #4   Title Pt will ambulate > 200' with RW mod I over level surfaces inside for improved mobility.    Baseline 08/30/20 250' with RW mod I on level indoor surfaces.    Status Achieved    Target Date --      PT SHORT TERM GOAL #5   Title Pt will be further asssessed to determine if there is need for left AFO and/or toe cap.    Baseline 08/30/20 Pt is showing improved gait quality with left AFO. PT requesting order for one    Status Achieved    Target Date 09/02/20             PT Long Term Goals - 09/29/20 0857      PT LONG TERM GOAL #1   Title Pt will ambulate >300' with bilateral loftstrand crutches for improved mobility on level paved or grass surfaces to be able to get to his buidling out back safely CGA.    Baseline 09/29/20: min guard to min assist currently    Time --    Period --    Status Partially Met      PT LONG TERM GOAL #2   Title Pt will increase gait speed to >0.36ms  for improved gait safety.    Baseline 09/27/20: 0.14 m/s    Status Not Met      PT LONG TERM GOAL #3   Title Pt will improve BERG to >/= 37/56 to decrease falls risk    Baseline 09/27/20: 36/56 scored today, improved just not to goal level    Status Partially Met      PT LONG TERM GOAL #4   Title Pt will  ambulate up/down 6 steps with rails mod I to safetly enter/exit home.    Baseline 09/29/20: supervision to min guard assist for stair negotiation    Time --    Period --    Status Not Met                 Plan - 10/04/20 1117    Clinical Impression Statement Assessed location and mechanism of LUE pain to ensure recent increase in use of lofstrands is not contributing to pain.  Pt presents with pain at R bicep tendon and is strong but painful with resistance which may indicate a tendonopathy.  Pt mainly experiences pain with prolonged elbow flexion or lifting; no pain with gait with lofstrands.  Will continue to monitor.  Continued to perform specific gait and balance training focusing on postural control, weight shifting and timing of gait sequence.  Pt demonstrated improvements with gait sequence within the session with lofstrand crutches.  Will continue to address and progress towards LTG.    Personal Factors and Comorbidities Comorbidity 3+;Time since onset of injury/illness/exacerbation;Past/Current Experience    Comorbidities multiple falls, vitamin D insufficiency, Type 1 IDDM, diabetic retinopathy, hypothyroidism, HTN, hyperlipidemia, R tibia fracture, L ankle fracture with ORIF    Examination-Activity Limitations Carry;Locomotion Level;Stairs;Stand;Transfers;Squat    Examination-Participation Restrictions Community Activity;Driving;Yard Work    SMerchant navy officerEvolving/Moderate complexity    Rehab Potential Good    PT Frequency 2x / week   plus eval   PT Duration 8 weeks    PT Treatment/Interventions ADLs/Self Care Home Management;Aquatic Therapy;Electrical Stimulation;DME Instruction;Gait training;Stair training;Functional mobility training;Therapeutic activities;Therapeutic exercise;Balance training;Neuromuscular re-education;Patient/family education;Orthotic Fit/Training;Passive range of  motion;Energy conservation;Manual techniques    PT Next Visit Plan He has not  called [facility]-is hesitant to get second AFO.  Continue with use of left posterior Thuasane AFO. Gait with bilateral  loftstrand crutches.  Has he been practicing "conscious walking" in hallway at home?   Balance with rockerboard, weight shifting during gait and greater stance control.  Glute strengthening.    Consulted and Agree with Plan of Care Patient           Patient will benefit from skilled therapeutic intervention in order to improve the following deficits and impairments:  Abnormal gait,Decreased balance,Decreased endurance,Decreased coordination,Decreased range of motion,Decreased strength,Difficulty walking,Impaired tone,Postural dysfunction,Decreased activity tolerance,Decreased mobility,Impaired flexibility,Increased edema  Visit Diagnosis: Other abnormalities of gait and mobility  Muscle weakness (generalized)  Unsteadiness on feet  Repeated falls  Muscle weakness  Difficulty in walking, not elsewhere classified  Foot drop, left  Foot drop, right     Problem List Patient Active Problem List   Diagnosis Date Noted  . OAB (overactive bladder) 09/21/2020  . Conjunctival injection, right 09/21/2020  . Essential hypertension 09/21/2020  . Urinary dysfunction 09/21/2020  . Thoracic outlet syndrome 05/03/2020  . Controlled type 1 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 ([place]) 03/24/2020  . Right epiretinal membrane 03/24/2020  . Retinal exudates and deposits 03/24/2020  . 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 Nuclear sclerotic cataract of both eyes 03/24/2020  . Gait disorder 03/23/2020  . Benign prostatic hyperplasia without lower urinary tract symptoms 03/01/2020  . Diabetic gastroparesis associated with type 1 diabetes mellitus ([place]) 03/01/2020  . Uncontrolled type 1 diabetes mellitus with hyperglycemia ([place]) 03/01/2020  . Gait disturbance 08/19/2019  . Acquired diplegia ([place]) 08/19/2019  . Vitamin D insufficiency  11/29/2016  . Hypogonadism in male 11/01/2016  . 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[place])   . Hypothyroidism   . Hypertension   . Abnormal liver function tests 03/06/2013  . Type I (juvenile type) diabetes mellitus without mention of complication, uncontrolled 12/03/2007  . HYPERCHOLESTEROLEMIA 11/12/2007  . MULTIPLE SCLEROSIS 08/29/2007  . PROLIFERATIVE DIABETIC RETINOPATHY 08/29/2007    [name], PT, DPT 10/04/20    11:26 AM    [facility] [address] [place], [place], [address] Phone: [phone]   Fax:  [phone]  Name: Johnathan Ross MRN: [id number] Date of Birth: [birth year]

## 2020-10-05 NOTE — Addendum Note (Signed)
Addended by: Cherly Anderson A on: 10/05/2020 08:09 PM   Modules accepted: Orders

## 2020-10-06 ENCOUNTER — Ambulatory Visit: Payer: Medicare Other | Admitting: Physical Therapy

## 2020-10-06 ENCOUNTER — Other Ambulatory Visit: Payer: Self-pay

## 2020-10-06 ENCOUNTER — Encounter: Payer: Self-pay | Admitting: Physical Therapy

## 2020-10-06 DIAGNOSIS — R296 Repeated falls: Secondary | ICD-10-CM

## 2020-10-06 DIAGNOSIS — M6281 Muscle weakness (generalized): Secondary | ICD-10-CM | POA: Diagnosis not present

## 2020-10-06 DIAGNOSIS — R2689 Other abnormalities of gait and mobility: Secondary | ICD-10-CM

## 2020-10-06 DIAGNOSIS — R2681 Unsteadiness on feet: Secondary | ICD-10-CM | POA: Diagnosis not present

## 2020-10-06 DIAGNOSIS — M21372 Foot drop, left foot: Secondary | ICD-10-CM | POA: Diagnosis not present

## 2020-10-06 DIAGNOSIS — R262 Difficulty in walking, not elsewhere classified: Secondary | ICD-10-CM | POA: Diagnosis not present

## 2020-10-06 NOTE — Therapy (Signed)
[facility] [address] [place], [place], [address] Phone: [phone]   Fax:  [phone]  Physical Therapy Treatment  Patient Details  Name: Johnathan Ross MRN: [id number] Date of Birth: April 20, 1971 Referring Provider (PT): Britt Bottom, MD   Encounter Date: 10/06/2020   PT End of Session - 10/06/20 0851    Visit Number 15    Number of Visits 25    Date for PT Re-Evaluation 12/01/20   60 day POC, 90 day cert   Authorization Type Medicare Part A and B; 10th visit PN    Progress Note Due on Visit 20    PT Start Time 0846    PT Stop Time 0930    PT Time Calculation (min) 44 min    Equipment Utilized During Treatment Other (comment);Gait belt   lofstrands, pt's right AFO   Activity Tolerance Patient tolerated treatment well    Behavior During Therapy [facility] for tasks assessed/performed           Past Medical History:  Diagnosis Date  . Hyperlipidemia   . Hypertension   . Hypogonadism in male 11/01/2016  . Hypothyroidism   . Multiple sclerosis ([place])   . Proliferative diabetic retinopathy(362.02)   . Type 1 diabetes mellitus ([place]) dx'd 1994  . Vitamin D insufficiency 11/29/2016    Past Surgical History:  Procedure Laterality Date  . EYE SURGERY Bilateral    "laser for diabetic retinopathy"  . FRACTURE SURGERY    . IR VENO/EXT/UNI LEFT  03/24/2020  . OPEN REDUCTION INTERNAL FIXATION (ORIF) TIBIA/FIBULA FRACTURE Right 10/30/2016  . ORIF ANKLE FRACTURE Left 2015  . ORIF TIBIA FRACTURE Right 10/30/2016   Procedure: OPEN REDUCTION INTERNAL FIXATION (ORIF) TIBIA FIBULA FRACTURE;  Surgeon: Altamese Phillips, MD;  Location: [facility];  Service: Orthopedics;  Laterality: Right;  . RETINAL LASER PROCEDURE Bilateral    "for diabetic retinopathy"    There were no vitals filed for this visit.   Subjective Assessment - 10/06/20 0850    Subjective No new complaints. No falls or pain to report. "I worked up a sweat" when asked  how he felt after last session.    Pertinent History multiple falls, vitamin D insufficiency, Type 1 IDDM, diabetic retinopathy, hypothyroidism, HTN, hyperlipidemia, R tibia fracture, L ankle fracture with ORIF    Patient Stated Goals Wants to get back to working on his walking and strength.    Currently in Pain? No/denies    Pain Score 0-No pain                   [facility] Adult PT Treatment/Exercise - 10/06/20 0852      Transfers   Transfers Sit to Stand;Stand to Sit    Sit to Stand 6: Modified independent (Device/Increase time)    Stand to Sit 6: Modified independent (Device/Increase time)      Ambulation/Gait   Ambulation/Gait Yes    Ambulation/Gait Assistance 4: Min guard;4: Min assist    Ambulation/Gait Assistance Details use of forearm crutches with gait with 1-2 episodes of right LE extensor tone with 1st half, min guard assist for safety. multiple episodes of bil LE's locking out with second bout of gait after activities in parallel bars with up to min assist for balance x 3.    Ambulation Distance (Feet) 65 Feet   x1, 55 x1   Assistive device Lofstrands    Gait Pattern Step-to pattern;Step-through pattern;Decreased step length - left;Decreased stance time - right;Decreased stride length;Decreased hip/knee  flexion - right;Decreased hip/knee flexion - left;Decreased dorsiflexion - left;Decreased dorsiflexion - right;Left hip hike;Right genu recurvatum;Poor foot clearance - left    Ambulation Surface Level;Indoor      Neuro Re-ed    Neuro Re-ed Details  for balance/muscle re-ed: with rockerboard in ant/posterior directions with UE support on bars- with single leg stance on board pt performed contralateral march for 2 sets of 3 reps each side with assist needed right>left LE for knee flexion and assist/facilitation for decreased hip hike on right side; then with stance on board pt performed contralateral LE stepping over board to floor/then back over board to starting place with  emphasis on knee flexion and push off for 10 reps each side. assist/facilitation for weight shfitng with cues to keep '"soft" knees; with bil feet on board for alternating UE raises, progressign to bil UE raises with emphasis on tall posture/weight shifting for balance. then with arms at sides- head movements left<>right, up<>down with EO for 7-8 reps each/each way. min guard to min assist for balance.      Exercises   Exercises Other Exercises    Other Exercises  prone on mat table: passive quad stretch for 30 sec's x 1 rep each side before ex's performed. glut kick backs for 2 sets of 5 reps each side with left>right lifting off mat, assist to keep knee bent with movements needed.                    PT Short Term Goals - 10/05/20 1951      PT SHORT TERM GOAL #1   Title Pt will ambulate up/down 6 stairs with railing mod I to safely enter home.    Time 4    Period Weeks    Status New    Target Date 11/01/20      PT SHORT TERM GOAL #2   Title Pt will be instructed on proper wear time when new AFO is received.    Baseline --    Time 4    Period Weeks    Status On-going    Target Date 11/01/20      PT SHORT TERM GOAL #3   Title --    Time --    Period --    Status --    Target Date --             PT Long Term Goals - 10/05/20 1955      PT LONG TERM GOAL #1   Title Pt will ambulate >300' with bilateral loftstrand crutches for improved mobility on level paved or grass surfaces to be able to get to his buidling out back safely CGA. ( LTGs due 11/25/20)    Baseline 09/29/20: min guard to min assist currently    Time 8    Period Weeks    Status On-going    Target Date 11/25/20      PT LONG TERM GOAL #2   Title Pt will increase Berg from 36/56 to >38/56 for improved balance.    Time 8    Period Weeks    Status Revised    Target Date 11/25/20      PT LONG TERM GOAL #3   Title Pt will increase speed from 0.66m/s (0.84ft/sec) to >0.77m/s for improved mobility with  bilateral AFOs and walker.    Time 8    Period Weeks    Status On-going    Target Date 11/25/20  Plan - 10/06/20 0852    Clinical Impression Statement Today's skilled session continued to address gait with forearm crutches, LE strenghtening and balance training with LE fatigue noted with gait after balance board activites. No other issues noted or reported in session. The pt should benefit from continued PT to progress toward unmet goals.    Personal Factors and Comorbidities Comorbidity 3+;Time since onset of injury/illness/exacerbation;Past/Current Experience    Comorbidities multiple falls, vitamin D insufficiency, Type 1 IDDM, diabetic retinopathy, hypothyroidism, HTN, hyperlipidemia, R tibia fracture, L ankle fracture with ORIF    Examination-Activity Limitations Carry;Locomotion Level;Stairs;Stand;Transfers;Squat    Examination-Participation Restrictions Community Activity;Driving;Yard Work    Merchant navy officer Evolving/Moderate complexity    Rehab Potential Good    PT Frequency 2x / week   plus eval   PT Duration 8 weeks    PT Treatment/Interventions ADLs/Self Care Home Management;Aquatic Therapy;Electrical Stimulation;DME Instruction;Gait training;Stair training;Functional mobility training;Therapeutic activities;Therapeutic exercise;Balance training;Neuromuscular re-education;Patient/family education;Orthotic Fit/Training;Passive range of motion;Energy conservation;Manual techniques    PT Next Visit Plan He has not called [facility]-is hesitant to get second AFO.  Continue with use of left posterior [place] AFO. Gait with bilateral  loftstrand crutches.  Has he been practicing "conscious walking" in hallway at home?   Balance with rockerboard, weight shifting during gait and greater stance control.  Glute strengthening.    Consulted and Agree with Plan of Care Patient           Patient will benefit from skilled therapeutic intervention in order  to improve the following deficits and impairments:  Abnormal gait,Decreased balance,Decreased endurance,Decreased coordination,Decreased range of motion,Decreased strength,Difficulty walking,Impaired tone,Postural dysfunction,Decreased activity tolerance,Decreased mobility,Impaired flexibility,Increased edema  Visit Diagnosis: Other abnormalities of gait and mobility  Muscle weakness (generalized)  Unsteadiness on feet  Repeated falls     Problem List Patient Active Problem List   Diagnosis Date Noted  . OAB (overactive bladder) 09/21/2020  . Conjunctival injection, right 09/21/2020  . Essential hypertension 09/21/2020  . Urinary dysfunction 09/21/2020  . Thoracic outlet syndrome 05/03/2020  . Controlled type 1 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 ([place]) 03/24/2020  . Right epiretinal membrane 03/24/2020  . Retinal exudates and deposits 03/24/2020  . 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 Nuclear sclerotic cataract of both eyes 03/24/2020  . Gait disorder 03/23/2020  . Benign prostatic hyperplasia without lower urinary tract symptoms 03/01/2020  . Diabetic gastroparesis associated with type 1 diabetes mellitus ([place]) 03/01/2020  . Uncontrolled type 1 diabetes mellitus with hyperglycemia ([place]) 03/01/2020  . Gait disturbance 08/19/2019  . Acquired diplegia ([place]) 08/19/2019  . Vitamin D insufficiency 11/29/2016  . Hypogonadism in male 11/01/2016  . 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[place])   . Hypothyroidism   . Hypertension   . Abnormal liver function tests 03/06/2013  . Type I (juvenile type) diabetes mellitus without mention of complication, uncontrolled 12/03/2007  . HYPERCHOLESTEROLEMIA 11/12/2007  . MULTIPLE SCLEROSIS 08/29/2007  . PROLIFERATIVE DIABETIC RETINOPATHY 08/29/2007    Willow Ora, PTA, [facility] [address] [place], [place] [address] [phone] 10/06/20, 3:15 PM    Name: Johnathan Ross MRN: [id number] Date of Birth: [birth year]

## 2020-10-10 ENCOUNTER — Other Ambulatory Visit: Payer: Self-pay

## 2020-10-10 ENCOUNTER — Ambulatory Visit: Payer: Medicare Other | Admitting: Physical Therapy

## 2020-10-10 ENCOUNTER — Other Ambulatory Visit (INDEPENDENT_AMBULATORY_CARE_PROVIDER_SITE_OTHER): Payer: Medicare Other

## 2020-10-10 DIAGNOSIS — R2681 Unsteadiness on feet: Secondary | ICD-10-CM

## 2020-10-10 DIAGNOSIS — M21371 Foot drop, right foot: Secondary | ICD-10-CM

## 2020-10-10 DIAGNOSIS — E1065 Type 1 diabetes mellitus with hyperglycemia: Secondary | ICD-10-CM

## 2020-10-10 DIAGNOSIS — M21372 Foot drop, left foot: Secondary | ICD-10-CM

## 2020-10-10 DIAGNOSIS — R262 Difficulty in walking, not elsewhere classified: Secondary | ICD-10-CM

## 2020-10-10 DIAGNOSIS — E039 Hypothyroidism, unspecified: Secondary | ICD-10-CM | POA: Diagnosis not present

## 2020-10-10 DIAGNOSIS — R2689 Other abnormalities of gait and mobility: Secondary | ICD-10-CM | POA: Diagnosis not present

## 2020-10-10 DIAGNOSIS — M6281 Muscle weakness (generalized): Secondary | ICD-10-CM | POA: Diagnosis not present

## 2020-10-10 DIAGNOSIS — R296 Repeated falls: Secondary | ICD-10-CM

## 2020-10-10 DIAGNOSIS — E782 Mixed hyperlipidemia: Secondary | ICD-10-CM | POA: Diagnosis not present

## 2020-10-10 LAB — BASIC METABOLIC PANEL
BUN: 20 mg/dL (ref 6–23)
CO2: 29 mEq/L (ref 19–32)
Calcium: 9.2 mg/dL (ref 8.4–10.5)
Chloride: 102 mEq/L (ref 96–112)
Creatinine, Ser: 1.23 mg/dL (ref 0.40–1.50)
GFR: 69.58 mL/min (ref >60.00)
Glucose, Bld: 227 mg/dL — ABNORMAL HIGH (ref 70–99)
Potassium: 4.2 mEq/L (ref 3.5–5.1)
Sodium: 140 mEq/L (ref 135–145)

## 2020-10-10 LAB — LIPID PANEL
Cholesterol: 175 mg/dL (ref 0–200)
HDL: 48.1 mg/dL (ref >39.00)
LDL Cholesterol: 116 mg/dL — ABNORMAL HIGH (ref 0–99)
NonHDL: 126.61
Total CHOL/HDL Ratio: 4
Triglycerides: 52 mg/dL (ref 0.0–149.0)
VLDL: 10.4 mg/dL (ref 0.0–40.0)

## 2020-10-10 LAB — TSH: TSH: 1.05 u[IU]/mL (ref 0.35–4.50)

## 2020-10-10 LAB — HEMOGLOBIN A1C: Hgb A1c MFr Bld: 7.9 % — ABNORMAL HIGH (ref 4.6–6.5)

## 2020-10-10 NOTE — Therapy (Signed)
[facility] [address] [place], [place], [address] Phone: [phone]   Fax:  [phone]  Physical Therapy Treatment  Patient Details  Name: Johnathan Ross MRN: [id number] Date of Birth: [birth year] Referring Provider (PT): Felecia Shelling, Nanine Means, MD   Encounter Date: 10/10/2020   PT End of Session - 10/10/20 0857    Visit Number 16    Number of Visits 25    Date for PT Re-Evaluation 12/01/20   60 day POC, 90 day cert   Authorization Type Medicare Part A and B; 10th visit PN    Progress Note Due on Visit 20    PT Start Time 0852    PT Stop Time 0940    PT Time Calculation (min) 48 min    Equipment Utilized During Treatment Other (comment)   lofstrands, pt's right AFO   Activity Tolerance Patient tolerated treatment well    Behavior During Therapy [facility] for tasks assessed/performed           Past Medical History:  Diagnosis Date  . Hyperlipidemia   . Hypertension   . Hypogonadism in male 11/01/2016  . Hypothyroidism   . Multiple sclerosis ([place])   . Proliferative diabetic retinopathy(362.02)   . Type 1 diabetes mellitus ([place]) dx'd 1994  . Vitamin D insufficiency 11/29/2016    Past Surgical History:  Procedure Laterality Date  . EYE SURGERY Bilateral    "laser for diabetic retinopathy"  . FRACTURE SURGERY    . IR VENO/EXT/UNI LEFT  03/24/2020  . OPEN REDUCTION INTERNAL FIXATION (ORIF) TIBIA/FIBULA FRACTURE Right 10/30/2016  . ORIF ANKLE FRACTURE Left 2015  . ORIF TIBIA FRACTURE Right 10/30/2016   Procedure: OPEN REDUCTION INTERNAL FIXATION (ORIF) TIBIA FIBULA FRACTURE;  Surgeon: Altamese , MD;  Location: [facility];  Service: Orthopedics;  Laterality: Right;  . RETINAL LASER PROCEDURE Bilateral    "for diabetic retinopathy"    There were no vitals filed for this visit.   Subjective Assessment - 10/10/20 0858    Subjective Has been working on walking in the hallway slowly with lofstrands, takes a lot of  concentration.  Went to son's graduation at the [employer].    Pertinent History multiple falls, vitamin D insufficiency, Type 1 IDDM, diabetic retinopathy, hypothyroidism, HTN, hyperlipidemia, R tibia fracture, L ankle fracture with ORIF    Patient Stated Goals Wants to get back to working on his walking and strength.    Currently in Pain? No/denies                             [facility] Adult PT Treatment/Exercise - 10/10/20 0919      Ambulation/Gait   Ambulation/Gait Yes    Ambulation/Gait Assistance 4: Min guard    Ambulation/Gait Assistance Details continued use of forearm crutches on level surface focusing on carryover of weight shifting, hip activation and postural control performed on mat.  Pt demonstrates improved weight shift and stance control over RLE with more consistent step through and step lengh on LLE with decreased toe drag.  Continued to demonstrate intermittent R foot drag.  Required more standing rest breaks as he fatigued.    Ambulation Distance (Feet) 230 Feet    Assistive device Lofstrands    Gait Pattern Step-to pattern;Step-through pattern;Decreased hip/knee flexion - right;Decreased dorsiflexion - left;Decreased dorsiflexion - right;Left hip hike;Decreased step length - right;Poor foot clearance - right    Ambulation Surface Level;Indoor      Neuro Re-ed  Neuro Re-ed Details  NMR for hip strategy and postural control/righting training in tall kneeling with UE on physioball: squats with UE support x 10, squats without UE support x 10 focusing on use of hip extensors and decreased use of back and neck extension, lateral squats with reaching up and diagonal with gaze towards hand x 10 each side for lateral hip strengthening, weight shifting forwards<>back (closed chain knee extension<>flexion) keeping trunk upright and hands on physioball for hamstring activation, maintaining balance while performing 10 reps head turns and nods x 2 sets without UE support.   Required min A for balance when releasing UE support due to tendency to flex forwards and difficulty maintaining hip extension.                    PT Short Term Goals - 10/05/20 1951      PT SHORT TERM GOAL #1   Title Pt will ambulate up/down 6 stairs with railing mod I to safely enter home.    Time 4    Period Weeks    Status New    Target Date 11/01/20      PT SHORT TERM GOAL #2   Title Pt will be instructed on proper wear time when new AFO is received.    Baseline --    Time 4    Period Weeks    Status On-going    Target Date 11/01/20      PT SHORT TERM GOAL #3   Title --    Time --    Period --    Status --    Target Date --             PT Long Term Goals - 10/05/20 1955      PT LONG TERM GOAL #1   Title Pt will ambulate >300' with bilateral loftstrand crutches for improved mobility on level paved or grass surfaces to be able to get to his buidling out back safely CGA. ( LTGs due 11/25/20)    Baseline 09/29/20: min guard to min assist currently    Time 8    Period Weeks    Status On-going    Target Date 11/25/20      PT LONG TERM GOAL #2   Title Pt will increase Berg from 36/56 to >38/56 for improved balance.    Time 8    Period Weeks    Status Revised    Target Date 11/25/20      PT LONG TERM GOAL #3   Title Pt will increase speed from 0.67m/s (0.76ft/sec) to >0.38m/s for improved mobility with bilateral AFOs and walker.    Time 8    Period Weeks    Status On-going    Target Date 11/25/20                 Plan - 10/10/20 1027    Clinical Impression Statement Utilized NMR in tall kneeling to facilitate hip strengthening, postural control training and weight shifting with focus on carryover to gait with lofstrands.  With longer distances pt continues to require increased standing rest breaks and decreased ability to advance each LE without lifting through UE but overall pt continues to demonstrate improved upright posture, gait mechanics and  sequencing with decreased episodes of toe drag.  Will continue to address and progress towards LTG.    Personal Factors and Comorbidities Comorbidity 3+;Time since onset of injury/illness/exacerbation;Past/Current Experience    Comorbidities multiple falls, vitamin D insufficiency, Type 1 IDDM,  diabetic retinopathy, hypothyroidism, HTN, hyperlipidemia, R tibia fracture, L ankle fracture with ORIF    Examination-Activity Limitations Carry;Locomotion Level;Stairs;Stand;Transfers;Squat    Examination-Participation Restrictions Community Activity;Driving;Yard Work    [employer]chant [employer] officer Evolving/Moderate complexity    Rehab Potential Good    PT Frequency 2x / week   plus eval   PT Duration 8 weeks    PT Treatment/Interventions ADLs/Self Care Home Management;Aquatic Therapy;Electrical Stimulation;DME Instruction;Gait training;Stair training;Functional mobility training;Therapeutic activities;Therapeutic exercise;Balance training;Neuromuscular re-education;Patient/family education;Orthotic Fit/Training;Passive range of motion;Energy conservation;Manual techniques    PT Next Visit Plan He has not called [employer]-is hesitant to get second AFO.  Continue with use of left posterior [place] AFO. Gait with bilateral  loftstrand crutches. Balance with rockerboard, weight shifting during gait and greater stance control.  Glute strengthening.    Consulted and Agree with Plan of Care Patient           Patient will benefit from skilled therapeutic intervention in order to improve the following deficits and impairments:  Abnormal gait,Decreased balance,Decreased endurance,Decreased coordination,Decreased range of motion,Decreased strength,Difficulty walking,Impaired tone,Postural dysfunction,Decreased activity tolerance,Decreased mobility,Impaired flexibility,Increased edema  Visit Diagnosis: Other abnormalities of gait and mobility  Muscle weakness (generalized)  Unsteadiness on feet  Repeated  falls  Difficulty in walking, not elsewhere classified  Foot drop, left  Foot drop, right     Problem List Patient Active Problem List   Diagnosis Date Noted  . OAB (overactive bladder) 09/21/2020  . Conjunctival injection, right 09/21/2020  . Essential hypertension 09/21/2020  . Urinary dysfunction 09/21/2020  . Thoracic outlet syndrome 05/03/2020  . Controlled type 1 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 ([place]) 03/24/2020  . Right epiretinal membrane 03/24/2020  . Retinal exudates and deposits 03/24/2020  . 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 Nuclear sclerotic cataract of both eyes 03/24/2020  . Gait disorder 03/23/2020  . Benign prostatic hyperplasia without lower urinary tract symptoms 03/01/2020  . Diabetic gastroparesis associated with type 1 diabetes mellitus ([place]) 03/01/2020  . Uncontrolled type 1 diabetes mellitus with hyperglycemia ([place]) 03/01/2020  . Gait disturbance 08/19/2019  . Acquired diplegia ([place]) 08/19/2019  . Vitamin D insufficiency 11/29/2016  . Hypogonadism in male 11/01/2016  . 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[place])   . Hypothyroidism   . Hypertension   . Abnormal liver function tests 03/06/2013  . Type I (juvenile type) diabetes mellitus without mention of complication, uncontrolled 12/03/2007  . HYPERCHOLESTEROLEMIA 11/12/2007  . MULTIPLE SCLEROSIS 08/29/2007  . PROLIFERATIVE DIABETIC RETINOPATHY 08/29/2007    [name], PT, DPT 10/10/20    10:31 AM    [facility] [address] [place], [place], [address] Phone: [phone]   Fax:  [phone]  Name: JAHFARI AMBERS MRN: [id number] Date of Birth: 05-20-1971

## 2020-10-12 ENCOUNTER — Telehealth: Payer: Self-pay | Admitting: Endocrinology

## 2020-10-12 NOTE — Telephone Encounter (Signed)
Johnathan Ross,  Can you please call him and talk him through how to upload his pump so I can print it out for his virtual visit with Dr. Dwyane Dee tomorrow please?

## 2020-10-12 NOTE — Telephone Encounter (Signed)
Pt calling regarding his app 10/13/2020 and would like to ask Nurse Suanne Marker, if she is able to get all of pt's reading from pt's pump.  Pt states if needing the numbers, we can call him back.

## 2020-10-13 ENCOUNTER — Ambulatory Visit: Payer: Medicare Other | Admitting: Physical Therapy

## 2020-10-13 ENCOUNTER — Other Ambulatory Visit: Payer: Self-pay

## 2020-10-13 ENCOUNTER — Encounter: Payer: Self-pay | Admitting: Physical Therapy

## 2020-10-13 ENCOUNTER — Telehealth: Payer: Medicare Other | Admitting: Endocrinology

## 2020-10-13 DIAGNOSIS — R296 Repeated falls: Secondary | ICD-10-CM | POA: Diagnosis not present

## 2020-10-13 DIAGNOSIS — M21372 Foot drop, left foot: Secondary | ICD-10-CM | POA: Diagnosis not present

## 2020-10-13 DIAGNOSIS — M6281 Muscle weakness (generalized): Secondary | ICD-10-CM

## 2020-10-13 DIAGNOSIS — R2689 Other abnormalities of gait and mobility: Secondary | ICD-10-CM

## 2020-10-13 DIAGNOSIS — R2681 Unsteadiness on feet: Secondary | ICD-10-CM

## 2020-10-13 DIAGNOSIS — R262 Difficulty in walking, not elsewhere classified: Secondary | ICD-10-CM | POA: Diagnosis not present

## 2020-10-13 NOTE — Telephone Encounter (Signed)
Thanks [name],

## 2020-10-13 NOTE — Therapy (Signed)
[facility] [address] [place], [place], [address] Phone: [phone]   Fax:  [phone]  Physical Therapy Treatment  Patient Details  Name: Johnathan Ross MRN: [id number] Date of Birth: 10-03-1970 Referring Provider (PT): Britt Bottom, MD   Encounter Date: 10/13/2020   PT End of Session - 10/13/20 0852    Visit Number 17    Number of Visits 25    Date for PT Re-Evaluation 12/01/20   60 day POC, 90 day cert   Authorization Type Medicare Part A and B; 10th visit PN    Progress Note Due on Visit 20    PT Start Time 0849    PT Stop Time 0930    PT Time Calculation (min) 41 min    Equipment Utilized During Treatment Other (comment)   lofstrands, pt's right AFO   Activity Tolerance Patient tolerated treatment well    Behavior During Therapy [facility] for tasks assessed/performed           Past Medical History:  Diagnosis Date  . Hyperlipidemia   . Hypertension   . Hypogonadism in male 11/01/2016  . Hypothyroidism   . Multiple sclerosis ([place])   . Proliferative diabetic retinopathy(362.02)   . Type 1 diabetes mellitus ([place]) dx'd 1994  . Vitamin D insufficiency 11/29/2016    Past Surgical History:  Procedure Laterality Date  . EYE SURGERY Bilateral    "laser for diabetic retinopathy"  . FRACTURE SURGERY    . IR VENO/EXT/UNI LEFT  03/24/2020  . OPEN REDUCTION INTERNAL FIXATION (ORIF) TIBIA/FIBULA FRACTURE Right 10/30/2016  . ORIF ANKLE FRACTURE Left 2015  . ORIF TIBIA FRACTURE Right 10/30/2016   Procedure: OPEN REDUCTION INTERNAL FIXATION (ORIF) TIBIA FIBULA FRACTURE;  Surgeon: Altamese Guion, MD;  Location: [facility];  Service: Orthopedics;  Laterality: Right;  . RETINAL LASER PROCEDURE Bilateral    "for diabetic retinopathy"    There were no vitals filed for this visit.   Subjective Assessment - 10/13/20 0851    Subjective No new complaints. No falls or pain to report.    Pertinent History multiple falls,  vitamin D insufficiency, Type 1 IDDM, diabetic retinopathy, hypothyroidism, HTN, hyperlipidemia, R tibia fracture, L ankle fracture with ORIF    Patient Stated Goals Wants to get back to working on his walking and strength.    Currently in Pain? No/denies    Pain Score 0-No pain                [facility] Adult PT Treatment/Exercise - 10/13/20 0853      Transfers   Transfers Sit to Stand;Stand to Sit;Floor to Transfer    Sit to Stand 6: Modified independent (Device/Increase time)    Stand to Sit 6: Modified independent (Device/Increase time)    Floor to Transfer 6: Modified independent (Device/Increase time)    Floor to Transfer Details (indicate cue type and reason) from mat table<>red mat on floor with UE support on mat table      Ambulation/Gait   Ambulation/Gait Yes    Ambulation/Gait Assistance 5: Supervision;4: Min guard    Ambulation/Gait Assistance Details continued with use of forearm crutches with gait in session with several stop needed to "reset" due to LE extensor tone and/or toe scuffing. no significant balance loss noted.    Ambulation Distance (Feet) 230 Feet   x1 with crutches, in/out of clinic with RW   Assistive device Lofstrands;Rolling walker    Gait Pattern Step-to pattern;Step-through pattern;Decreased hip/knee flexion - right;Decreased dorsiflexion -  left;Decreased dorsiflexion - right;Left hip hike;Decreased step length - right;Poor foot clearance - right    Ambulation Surface Level;Indoor      Neuro Re-ed    Neuro Re-ed Details  for strengthening//NMR: tall kneeling on red mat next to blue mat table-mini squats for 10 reps with no UE support, progressing to mini squats with OH raise with 2# ball for 10 reps with cues for return to full tall kneeling and equal weight bearing on LE's; with blue band resistance- lateral stepping of knee out/back in for 10 reps on each side with emphasis on staying tall and weight shifting with intermittent touch to mat table; with blue  band resistance-centered mini squats, then lateral squats toward each side for 10 reps each with cues for full return to tall kneeling, controlled movements with intermittent UE support on mat table.                    PT Short Term Goals - 10/05/20 1951      PT SHORT TERM GOAL #1   Title Pt will ambulate up/down 6 stairs with railing mod I to safely enter home.    Time 4    Period Weeks    Status New    Target Date 11/01/20      PT SHORT TERM GOAL #2   Title Pt will be instructed on proper wear time when new AFO is received.    Baseline --    Time 4    Period Weeks    Status On-going    Target Date 11/01/20      PT SHORT TERM GOAL #3   Title --    Time --    Period --    Status --    Target Date --             PT Long Term Goals - 10/05/20 1955      PT LONG TERM GOAL #1   Title Pt will ambulate >300' with bilateral loftstrand crutches for improved mobility on level paved or grass surfaces to be able to get to his buidling out back safely CGA. ( LTGs due 11/25/20)    Baseline 09/29/20: min guard to min assist currently    Time 8    Period Weeks    Status On-going    Target Date 11/25/20      PT LONG TERM GOAL #2   Title Pt will increase Berg from 36/56 to >38/56 for improved balance.    Time 8    Period Weeks    Status Revised    Target Date 11/25/20      PT LONG TERM GOAL #3   Title Pt will increase speed from 0.14m/s (0.47ft/sec) to >0.8m/s for improved mobility with bilateral AFOs and walker.    Time 8    Period Weeks    Status On-going    Target Date 11/25/20                 Plan - 10/13/20 0852    Clinical Impression Statement Today's skilled session continued to focus on core/hip strengthening and gait with forearm crutches. Pt continues to have episodes of extensor tone in LE's with walking needing to stop and reset, however not significant balance loss noted. The pt is progressing and should benefit from continued PT to progress toward  unmet goals.    Personal Factors and Comorbidities Comorbidity 3+;Time since onset of injury/illness/exacerbation;Past/Current Experience    Comorbidities multiple falls, vitamin D  insufficiency, Type 1 IDDM, diabetic retinopathy, hypothyroidism, HTN, hyperlipidemia, R tibia fracture, L ankle fracture with ORIF    Examination-Activity Limitations Carry;Locomotion Level;Stairs;Stand;Transfers;Squat    Examination-Participation Restrictions Community Activity;Driving;Yard Work    [employer]chant [employer] officer Evolving/Moderate complexity    Rehab Potential Good    PT Frequency 2x / week   plus eval   PT Duration 8 weeks    PT Treatment/Interventions ADLs/Self Care Home Management;Aquatic Therapy;Electrical Stimulation;DME Instruction;Gait training;Stair training;Functional mobility training;Therapeutic activities;Therapeutic exercise;Balance training;Neuromuscular re-education;Patient/family education;Orthotic Fit/Training;Passive range of motion;Energy conservation;Manual techniques    PT Next Visit Plan He has not called [employer]-is hesitant to get second AFO.  Continue with use of left posterior [place] AFO. Gait with bilateral  loftstrand crutches. Balance with rockerboard, weight shifting during gait and greater stance control.  Glute strengthening.    Consulted and Agree with Plan of Care Patient           Patient will benefit from skilled therapeutic intervention in order to improve the following deficits and impairments:  Abnormal gait,Decreased balance,Decreased endurance,Decreased coordination,Decreased range of motion,Decreased strength,Difficulty walking,Impaired tone,Postural dysfunction,Decreased activity tolerance,Decreased mobility,Impaired flexibility,Increased edema  Visit Diagnosis: Other abnormalities of gait and mobility  Muscle weakness (generalized)  Unsteadiness on feet  Repeated falls     Problem List Patient Active Problem List   Diagnosis Date Noted  .  OAB (overactive bladder) 09/21/2020  . Conjunctival injection, right 09/21/2020  . Essential hypertension 09/21/2020  . Urinary dysfunction 09/21/2020  . Thoracic outlet syndrome 05/03/2020  . Controlled type 1 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 ([place]) 03/24/2020  . Right epiretinal membrane 03/24/2020  . Retinal exudates and deposits 03/24/2020  . 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 Nuclear sclerotic cataract of both eyes 03/24/2020  . Gait disorder 03/23/2020  . Benign prostatic hyperplasia without lower urinary tract symptoms 03/01/2020  . Diabetic gastroparesis associated with type 1 diabetes mellitus ([place]) 03/01/2020  . Uncontrolled type 1 diabetes mellitus with hyperglycemia ([place]) 03/01/2020  . Gait disturbance 08/19/2019  . Acquired diplegia ([place]) 08/19/2019  . Vitamin D insufficiency 11/29/2016  . Hypogonadism in male 11/01/2016  . 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[place])   . Hypothyroidism   . Hypertension   . Abnormal liver function tests 03/06/2013  . Type I (juvenile type) diabetes mellitus without mention of complication, uncontrolled 12/03/2007  . HYPERCHOLESTEROLEMIA 11/12/2007  . MULTIPLE SCLEROSIS 08/29/2007  . PROLIFERATIVE DIABETIC RETINOPATHY 08/29/2007    Willow Ora, PTA, [facility] [address], [place], [place] [address] [phone] 10/13/20, 11:58 AM   Name: Johnathan Ross MRN: [id number] Date of Birth: [birth year]

## 2020-10-17 ENCOUNTER — Other Ambulatory Visit: Payer: Self-pay

## 2020-10-17 ENCOUNTER — Telehealth (INDEPENDENT_AMBULATORY_CARE_PROVIDER_SITE_OTHER): Payer: Medicare Other | Admitting: Endocrinology

## 2020-10-17 DIAGNOSIS — E782 Mixed hyperlipidemia: Secondary | ICD-10-CM

## 2020-10-17 DIAGNOSIS — E1065 Type 1 diabetes mellitus with hyperglycemia: Secondary | ICD-10-CM

## 2020-10-17 DIAGNOSIS — E039 Hypothyroidism, unspecified: Secondary | ICD-10-CM | POA: Diagnosis not present

## 2020-10-17 NOTE — Progress Notes (Signed)
Subjective:              Patient ID: Johnathan Ross, male   DOB: Dec 16, 1970, 50 y.o.   MRN: [id number]  I connected with the above-named patient by video enabled telemedicine application and verified that I am speaking with the correct person. The patient was explained the limitations of evaluation and management by telemedicine and the availability of in person appointments.  Patient also understood that there may be a patient responsible charge related to this service . Location of the patient: Patient's home . Location of the provider: Physician office Only the patient and myself were participating in the encounter The patient understood the above statements and agreed to proceed.    Diabetes   Diagnosis: Type 1 diabetes mellitus, date of diagnosis:  1995.   Insulin Pump followup:   CURRENT brand:  Medtronic  630 G   HISTORY: An insulin pump has been in use since 03/13/11.   PUMP settings are basal rate:  12 AM-2 AM = 0.8, 2 AM- 4 AM = 0.8, 4 AM-6 AM = 1.15,, 6 a.m.-9 AM = 2.05, 9 AM = 1. 85, 6 PM = 1.8, 9 PM = 1.60.  Carbohydrate to insulin ratio: 8 AM = 1: 8, 12 noon = 1: 5, after 6 p.m. = 1:6, 10 PM = 1: 10 Hyperglycemia correction factor: 1: 35,and after 10 p.m. 1: 45, target 120, active insulin time 4 hours.   Recent history:  His A1c has increased to 7.9   Current blood sugar patterns Evaluated from-Dexcom and pump download, management and problems identified:  Current management, blood sugar patterns and problems identified     He did not make the changes in his pump as recommended on the last visit even though he was sent information on MyChart  Has been using the Dexcom sensor  Again he feels that he is afraid of hypoglycemia overnight and will periodically suspend his pump if his blood sugar is around 124 up to 4 hours  He was told to reduce his basal rate at 2 AM but this is still at 0.8  However despite his trying to bolus at mealtimes in the  evenings frequently he may have significant spikes in his blood sugars after dinner  He did not make changes in his carbohydrate ratio to 1: 5 also as directed  He thinks he may be getting some higher fat meals including in the evening  Has only minimal hypoglycemia documented with His Dexcom sensor   CGM Dexcom G6 download for the last 2 weeks is interpreted as follows    Blood sugars are showing moderate amount of variability with standard deviation 72  On an average blood sugars are running mostly around 170-180 at all times but ranging higher on an average after about 10 PM until 1 AM when they are the highest  HYPERGLYCEMIA is not consistent but frequently higher after evening meal  Test blood sugars on an average are usually around 5-7 PM  Overnight blood sugars show significant variability especially between 11 PM-2 AM but on an average are still mostly high around 160-190 and the highest average is about 209 from 11 PM-12 AM  HYPOGLYCEMIA has been minimal and only occasionally around midnight or 7 PM  Postprandial blood sugars are periodically going higher after lunch with more after dinner especially in the last week  Sensor reliability appears good but high alarm is set at 280  CGM use % of time  98  2-week  average/GV  172, CV 31  Time in range      57% was 55  % Time Above 180  43  % Time above 250  7.5  % Time Below 70  0.4   Previous readings:     CGM use % of time  97  2-week average standard deviation  176/60  Time in range     55   % was 52  % Time Above 180  44  % Time above 250  10  % Time Below 70  0.8     PRE-MEAL Fasting Lunch Dinner Bedtime Overall  Glucose range:       Averages:  165   148  138    POST-MEAL PC Breakfast PC Lunch PC Dinner  Glucose range:     Averages:    212      Wt Readings from Last 3 Encounters:  09/27/20 208 lb (94.3 kg)  09/21/20 208 lb 8 oz (94.6 kg)  09/21/20 208 lb (94.3 kg)     Lab Results  Component  Value Date   HGBA1C 7.9 (H) 10/10/2020   HGBA1C 7.3 (H) 07/12/2020   HGBA1C 8.2 (H) 03/14/2020   Lab Results  Component Value Date   MICROALBUR <0.7 03/01/2020   LDLCALC 116 (H) 10/10/2020   CREATININE 1.23 10/10/2020     OTHER active problems discussed today: See review of systems    Allergies as of 10/17/2020      Reactions   Lisinopril Cough      Medication List       Accurate as of Oct 17, 2020  9:07 AM. If you have any questions, ask your nurse or doctor.        baclofen 10 MG tablet Commonly known as: LIORESAL Take one or two pills three times a day (up to 6 pills/day)   Contour Next Test test strip Generic drug: glucose blood Use as instructed. USE AS DIRECTED 8 TIMES PER DAY. E10.65   dalfampridine 10 MG Tb12 Commonly known as: Ampyra Take 1 tablet (10 mg total) by mouth 2 (two) times daily.   Dexcom G5 Receiver Kit Devi by Does not apply route.   Dexcom G6 Sensor Misc Use as directed.   Dexcom G6 Transmitter Misc Use to monitor blood sugar   diazepam 5 MG tablet Commonly known as: Valium Take 1 tablet (5 mg total) by mouth every 6 (six) hours as needed for anxiety. 1/2 to 1 pill qHS   Gvoke HypoPen 2-Pack 1 MG/0.2ML Soaj Generic drug: Glucagon Inject 1 Act into the skin daily as needed.   insulin aspart 100 UNIT/ML injection Commonly known as: NovoLOG USE UP TO 80 UNITS IN INSULIN PUMP DAILY-DX CODE E10.8   losartan 50 MG tablet Commonly known as: COZAAR Take 1 tablet (50 mg total) by mouth daily.   simvastatin 20 MG tablet Commonly known as: ZOCOR TAKE 1 TABLET BY MOUTH EVERYDAY AT BEDTIME   solifenacin 5 MG tablet Commonly known as: VESICARE Take 1 tablet (5 mg total) by mouth daily.   Synthroid 125 MCG tablet Generic drug: levothyroxine TAKE 1 TABLET BY MOUTH 6 DAYS PER WEEK   tamsulosin 0.4 MG Caps capsule Commonly known as: FLOMAX Take 1 capsule (0.4 mg total) by mouth daily.       Allergies:  Allergies  Allergen  Reactions  . Lisinopril Cough    Past Medical History:  Diagnosis Date  . Hyperlipidemia   . Hypertension   . Hypogonadism in male  11/01/2016  . Hypothyroidism   . Multiple sclerosis ([place])   . Proliferative diabetic retinopathy(362.02)   . Type 1 diabetes mellitus ([place]) dx'd 1994  . Vitamin D insufficiency 11/29/2016    Past Surgical History:  Procedure Laterality Date  . EYE SURGERY Bilateral    "laser for diabetic retinopathy"  . FRACTURE SURGERY    . IR VENO/EXT/UNI LEFT  03/24/2020  . OPEN REDUCTION INTERNAL FIXATION (ORIF) TIBIA/FIBULA FRACTURE Right 10/30/2016  . ORIF ANKLE FRACTURE Left 2015  . ORIF TIBIA FRACTURE Right 10/30/2016   Procedure: OPEN REDUCTION INTERNAL FIXATION (ORIF) TIBIA FIBULA FRACTURE;  Surgeon: Altamese Lynch, MD;  Location: [facility];  Service: Orthopedics;  Laterality: Right;  . RETINAL LASER PROCEDURE Bilateral    "for diabetic retinopathy"    Family History  Problem Relation Age of Onset  . Diabetes Sister   . Colon cancer Maternal Grandmother   . Colon polyps Maternal Uncle     Social History:  reports that he has never smoked. He has never used smokeless tobacco. He reports that he does not drink alcohol and does not use drugs.    Review of Systems      HYPERCHOLESTEROLEMIA: He has been taking simvastatin 20 mg daily  LDL is below 100 previously but now higher possibly from inadequate diet  His liver function consistently normal  Lab Results  Component Value Date   CHOL 175 10/10/2020   HDL 48.10 10/10/2020   LDLCALC 116 (H) 10/10/2020   LDLDIRECT 139.7 04/09/2014   TRIG 52.0 10/10/2020   CHOLHDL 4 10/10/2020   Lab Results  Component Value Date   ALT 21 07/12/2020    HYPERTENSION: He had been on lisinopril with good control  of blood pressure He is now on losartan as he may have had cough with lisinopril   BP Readings from Last 3 Encounters:  09/27/20 (!) 142/82  09/21/20 124/70  09/21/20 138/78    He has MULTIPLE  sclerosis, is followed in [place] by neurologist and still not on treatment  HYPOTHYROIDISM: He has had longstanding primary 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 autoimmune  His TSH previously had been frequently variable and not clear why it fluctuates, more recently been requiring lower doses He also does not feel any different whether his thyroid level is high or low  His dose has been changed to 125 mcg, 6.5 days/week on the last visit  He is able to get brand-name Synthroid and is using 90-day prescriptions  He is regular with taking his supplement before breakfast daily TSH is consistently normal   Lab Results  Component Value Date   TSH 1.05 10/10/2020   TSH 1.07 07/12/2020   TSH 6.17 (H) 03/14/2020   FREET4 1.06 07/12/2020   FREET4 0.75 03/14/2020   FREET4 1.21 12/03/2019        Objective:   Physical Exam  There were no vitals taken for this visit.           Assessment:      DIABETES type I on insulin pump:   See history of present illness for detailed discussion of current diabetes management, blood sugar patterns and problems identified  A1c is back up to 7.9, previously 7.3  He is on 630 Medtronic pump with the Dexcom 6 sensor  Although his blood sugars are only 55% within the target range his A1c indicates better reading Improvement may be related to stable blood sugars during the daytime, possibly less rebounding from low sugars overnight and better compliance using Dexcom for constant  monitoring  Again most of his hyperglycemia is around suppertime, sometimes before and frequently after eating More recently appears to have postprandial spikes with his evening meal and has not changed his carbohydrate ratio or timing of insulin as directed on the last visit As before he suspends his pump for fear of hypoglycemia during the night which seems to level out his blood sugar However did not change his settings on the pump as directed No documented hypoglycemia  recently except transiently once  HYPOTHYROIDISM:  TSH normal again as above   Lipids: Previously has been well controlled with simvastatin 20 mg, I recommended 40 mg dose but he is wanting to change his diet first  HYPERTENSION: Blood pressure appears better controlled now and continues on losartan 50 mg    Plan:       Needs to try and bolus for every meal 10 to 15 minutes in the evening before dinner  He will be sent instructions to change basal rate changes as follows  12 AM = 0.70, 1:30 AM = 0.70, 4:30 AM = 1.15 Carb ratio at 6 PM to be 1: 5  Sensitivity 1: 30 between 8 PM- 10 PM Avoid suspending the pump for excessive periods of time overnight lowest blood sugar is trending down significantly He will look into the 630 sensor to help suspend the pump automatically and restart when needed  No change in Synthroid dosage  Improve diet with low saturated fat with meats and dairy products and recheck lipids, consider increasing simvastatin if LDL still high  Follow-up 3 months with same labs   There are no Patient Instructions on file for this visit.     Elayne Snare  Note: This office note was prepared with Estate agent. Any transcriptional errors that result from this process are unintentional.

## 2020-10-19 ENCOUNTER — Other Ambulatory Visit: Payer: Self-pay

## 2020-10-19 ENCOUNTER — Encounter: Payer: Self-pay | Admitting: Physical Therapy

## 2020-10-19 ENCOUNTER — Ambulatory Visit: Payer: Medicare Other | Admitting: Physical Therapy

## 2020-10-19 DIAGNOSIS — M6281 Muscle weakness (generalized): Secondary | ICD-10-CM | POA: Diagnosis not present

## 2020-10-19 DIAGNOSIS — R262 Difficulty in walking, not elsewhere classified: Secondary | ICD-10-CM | POA: Diagnosis not present

## 2020-10-19 DIAGNOSIS — R296 Repeated falls: Secondary | ICD-10-CM | POA: Diagnosis not present

## 2020-10-19 DIAGNOSIS — R2681 Unsteadiness on feet: Secondary | ICD-10-CM

## 2020-10-19 DIAGNOSIS — R2689 Other abnormalities of gait and mobility: Secondary | ICD-10-CM

## 2020-10-19 DIAGNOSIS — M21372 Foot drop, left foot: Secondary | ICD-10-CM | POA: Diagnosis not present

## 2020-10-19 NOTE — Therapy (Signed)
[facility] [address] [place], [place], [address] Phone: [phone]   Fax:  [phone]  Physical Therapy Treatment  Patient Details  Name: Johnathan Ross MRN: [id number] Date of Birth: 13-Jun-1970 Referring Provider (PT): Felecia Shelling, Nanine Means, MD   Encounter Date: 10/19/2020   PT End of Session - 10/19/20 0850    Visit Number 18    Number of Visits 25    Date for PT Re-Evaluation 12/01/20   60 day POC, 90 day cert   Authorization Type Medicare Part A and B; 10th visit PN    Progress Note Due on Visit 20    PT Start Time 0848    PT Stop Time 0930    PT Time Calculation (min) 42 min    Equipment Utilized During Treatment Other (comment)   lofstrands, pt's right AFO   Activity Tolerance Patient tolerated treatment well    Behavior During Therapy [facility] for tasks assessed/performed           Past Medical History:  Diagnosis Date  . Hyperlipidemia   . Hypertension   . Hypogonadism in male 11/01/2016  . Hypothyroidism   . Multiple sclerosis ([place])   . Proliferative diabetic retinopathy(362.02)   . Type 1 diabetes mellitus ([place]) dx'd 1994  . Vitamin D insufficiency 11/29/2016    Past Surgical History:  Procedure Laterality Date  . EYE SURGERY Bilateral    "laser for diabetic retinopathy"  . FRACTURE SURGERY    . IR VENO/EXT/UNI LEFT  03/24/2020  . OPEN REDUCTION INTERNAL FIXATION (ORIF) TIBIA/FIBULA FRACTURE Right 10/30/2016  . ORIF ANKLE FRACTURE Left 2015  . ORIF TIBIA FRACTURE Right 10/30/2016   Procedure: OPEN REDUCTION INTERNAL FIXATION (ORIF) TIBIA FIBULA FRACTURE;  Surgeon: Altamese Luna, MD;  Location: [facility];  Service: Orthopedics;  Laterality: Right;  . RETINAL LASER PROCEDURE Bilateral    "for diabetic retinopathy"    There were no vitals filed for this visit.   Subjective Assessment - 10/19/20 0849    Subjective No new complaints. No falls or pain to report. Had a good day yesterday,able to do more  ex's before needing to rest. Unsure why that was the case.    Pertinent History multiple falls, vitamin D insufficiency, Type 1 IDDM, diabetic retinopathy, hypothyroidism, HTN, hyperlipidemia, R tibia fracture, L ankle fracture with ORIF    Patient Stated Goals Wants to get back to working on his walking and strength.    Currently in Pain? No/denies    Pain Score 0-No pain                 [facility] Adult PT Treatment/Exercise - 10/19/20 0850      Transfers   Transfers Sit to Stand;Stand to Sit    Sit to Stand 6: Modified independent (Device/Increase time)    Stand to Sit 6: Modified independent (Device/Increase time)      Ambulation/Gait   Ambulation/Gait Yes    Ambulation/Gait Assistance 5: Supervision;4: Min guard    Ambulation/Gait Assistance Details with 1st lap pt had very little exensor tone with good step/stride length; on 2cd lap pt with increased left extensor tone needing to stop and "reset" every 2-3 steps with min assist at times for left knee flexion, hip flexion to clear floor with swing phase.    Ambulation Distance (Feet) 65 Feet   x2   Assistive device Lofstrands;Rolling walker    Gait Pattern Step-to pattern;Step-through pattern;Decreased hip/knee flexion - right;Decreased dorsiflexion - left;Decreased dorsiflexion - right;Left hip  hike;Decreased step length - right;Poor foot clearance - right    Ambulation Surface Level;Indoor      Neuro Re-ed    Neuro Re-ed Details  for strengthening/NMR: in parallel bars with bil UE support- forward/backward walking with blue band resistance while in crouched position for 4 laps each way, then sidestepping in crouched left<>right for 3 laps against blue band resistance (resistance applied with pull toward each side for 3 laps), bil UE support. increased difficulty with left LE stepping with all directions; with rockerboard in anterior/posterior direction- single leg stance on board with contralateral stepping over/then back for 5 reps  each side. assist needed with non stance LE for knee flexion to clear board due to extensor tone left>right LE. bil UE support on bars.               PT Short Term Goals - 10/05/20 1951      PT SHORT TERM GOAL #1   Title Pt will ambulate up/down 6 stairs with railing mod I to safely enter home.    Time 4    Period Weeks    Status New    Target Date 11/01/20      PT SHORT TERM GOAL #2   Title Pt will be instructed on proper wear time when new AFO is received.    Baseline --    Time 4    Period Weeks    Status On-going    Target Date 11/01/20      PT SHORT TERM GOAL #3   Title --    Time --    Period --    Status --    Target Date --             PT Long Term Goals - 10/05/20 1955      PT LONG TERM GOAL #1   Title Pt will ambulate >300' with bilateral loftstrand crutches for improved mobility on level paved or grass surfaces to be able to get to his buidling out back safely CGA. ( LTGs due 11/25/20)    Baseline 09/29/20: min guard to min assist currently    Time 8    Period Weeks    Status On-going    Target Date 11/25/20      PT LONG TERM GOAL #2   Title Pt will increase Berg from 36/56 to >38/56 for improved balance.    Time 8    Period Weeks    Status Revised    Target Date 11/25/20      PT LONG TERM GOAL #3   Title Pt will increase speed from 0.62m/s (0.35ft/sec) to >0.5m/s for improved mobility with bilateral AFOs and walker.    Time 8    Period Weeks    Status On-going    Target Date 11/25/20                 Plan - 10/19/20 0850    Clinical Impression Statement Today's skilled session continued to focus on LE strengthening, NMR with focus on knee flexion/"soft" knees and gait with forearm crutches. Pt with increased extensor tone in left LE as pt fatigued with session needing up to min assist for second gait rep. Reinforced to pt that use of the AFO on left LE could help with tone management as pt is still reluctance to call [employer] about the  brace. The pt was able to perform increased consecutive reps of step overs on balance board this session vs previous time this activity was  done. The pt should benefit from continued PT to progress toward unmet goals.    Personal Factors and Comorbidities Comorbidity 3+;Time since onset of injury/illness/exacerbation;Past/Current Experience    Comorbidities multiple falls, vitamin D insufficiency, Type 1 IDDM, diabetic retinopathy, hypothyroidism, HTN, hyperlipidemia, R tibia fracture, L ankle fracture with ORIF    Examination-Activity Limitations Carry;Locomotion Level;Stairs;Stand;Transfers;Squat    Examination-Participation Restrictions Community Activity;Driving;Yard Work    [employer]chant [employer] officer Evolving/Moderate complexity    Rehab Potential Good    PT Frequency 2x / week   plus eval   PT Duration 8 weeks    PT Treatment/Interventions ADLs/Self Care Home Management;Aquatic Therapy;Electrical Stimulation;DME Instruction;Gait training;Stair training;Functional mobility training;Therapeutic activities;Therapeutic exercise;Balance training;Neuromuscular re-education;Patient/family education;Orthotic Fit/Training;Passive range of motion;Energy conservation;Manual techniques    PT Next Visit Plan He has not called [employer]-is hesitant to get second AFO.  Continue with use of left posterior [place] AFO. Gait with bilateral  loftstrand crutches. Balance with rockerboard, weight shifting during gait and greater stance control.  Glute strengthening.    Consulted and Agree with Plan of Care Patient           Patient will benefit from skilled therapeutic intervention in order to improve the following deficits and impairments:  Abnormal gait,Decreased balance,Decreased endurance,Decreased coordination,Decreased range of motion,Decreased strength,Difficulty walking,Impaired tone,Postural dysfunction,Decreased activity tolerance,Decreased mobility,Impaired flexibility,Increased edema  Visit  Diagnosis: Other abnormalities of gait and mobility  Muscle weakness (generalized)  Unsteadiness on feet  Repeated falls     Problem List Patient Active Problem List   Diagnosis Date Noted  . OAB (overactive bladder) 09/21/2020  . Conjunctival injection, right 09/21/2020  . Essential hypertension 09/21/2020  . Urinary dysfunction 09/21/2020  . Thoracic outlet syndrome 05/03/2020  . Controlled type 1 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 ([place]) 03/24/2020  . Right epiretinal membrane 03/24/2020  . Retinal exudates and deposits 03/24/2020  . 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 Nuclear sclerotic cataract of both eyes 03/24/2020  . Gait disorder 03/23/2020  . Benign prostatic hyperplasia without lower urinary tract symptoms 03/01/2020  . Diabetic gastroparesis associated with type 1 diabetes mellitus ([place]) 03/01/2020  . Uncontrolled type 1 diabetes mellitus with hyperglycemia ([place]) 03/01/2020  . Gait disturbance 08/19/2019  . Acquired diplegia ([place]) 08/19/2019  . Vitamin D insufficiency 11/29/2016  . Hypogonadism in male 11/01/2016  . 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[place])   . Hypothyroidism   . Hypertension   . Abnormal liver function tests 03/06/2013  . Type I (juvenile type) diabetes mellitus without mention of complication, uncontrolled 12/03/2007  . HYPERCHOLESTEROLEMIA 11/12/2007  . MULTIPLE SCLEROSIS 08/29/2007  . PROLIFERATIVE DIABETIC RETINOPATHY 08/29/2007   Willow Ora, PTA, [facility] [address] [place], [place] [address] [phone] 10/19/20, 10:03 AM   Name: Johnathan Ross MRN: [id number] Date of Birth: 07-Feb-1971

## 2020-10-21 ENCOUNTER — Ambulatory Visit: Payer: Medicare Other

## 2020-10-21 ENCOUNTER — Other Ambulatory Visit: Payer: Self-pay

## 2020-10-21 DIAGNOSIS — M21372 Foot drop, left foot: Secondary | ICD-10-CM | POA: Diagnosis not present

## 2020-10-21 DIAGNOSIS — R296 Repeated falls: Secondary | ICD-10-CM | POA: Diagnosis not present

## 2020-10-21 DIAGNOSIS — M6281 Muscle weakness (generalized): Secondary | ICD-10-CM | POA: Diagnosis not present

## 2020-10-21 DIAGNOSIS — R2689 Other abnormalities of gait and mobility: Secondary | ICD-10-CM | POA: Diagnosis not present

## 2020-10-21 DIAGNOSIS — R2681 Unsteadiness on feet: Secondary | ICD-10-CM | POA: Diagnosis not present

## 2020-10-21 DIAGNOSIS — R262 Difficulty in walking, not elsewhere classified: Secondary | ICD-10-CM | POA: Diagnosis not present

## 2020-10-21 NOTE — Therapy (Signed)
[facility] [address] [place], [place], [address] Phone: [phone]   Fax:  [phone]  Physical Therapy Treatment  Patient Details  Name: Johnathan Ross MRN: [id number] Date of Birth: 1970/06/20 Referring Provider (PT): Britt Bottom, MD   Encounter Date: 10/21/2020   PT End of Session - 10/21/20 0843    Visit Number 19    Number of Visits 25    Date for PT Re-Evaluation 12/01/20   60 day POC, 90 day cert   Authorization Type Medicare Part A and B; 10th visit PN    Progress Note Due on Visit 20    PT Start Time 0842    PT Stop Time 0926    PT Time Calculation (min) 44 min    Equipment Utilized During Treatment Other (comment)   lofstrands, pt's right AFO   Activity Tolerance Patient tolerated treatment well    Behavior During Therapy [facility] for tasks assessed/performed           Past Medical History:  Diagnosis Date  . Hyperlipidemia   . Hypertension   . Hypogonadism in male 11/01/2016  . Hypothyroidism   . Multiple sclerosis ([place])   . Proliferative diabetic retinopathy(362.02)   . Type 1 diabetes mellitus ([place]) dx'd 1994  . Vitamin D insufficiency 11/29/2016    Past Surgical History:  Procedure Laterality Date  . EYE SURGERY Bilateral    "laser for diabetic retinopathy"  . FRACTURE SURGERY    . IR VENO/EXT/UNI LEFT  03/24/2020  . OPEN REDUCTION INTERNAL FIXATION (ORIF) TIBIA/FIBULA FRACTURE Right 10/30/2016  . ORIF ANKLE FRACTURE Left 2015  . ORIF TIBIA FRACTURE Right 10/30/2016   Procedure: OPEN REDUCTION INTERNAL FIXATION (ORIF) TIBIA FIBULA FRACTURE;  Surgeon: Altamese Garysburg, MD;  Location: [facility];  Service: Orthopedics;  Laterality: Right;  . RETINAL LASER PROCEDURE Bilateral    "for diabetic retinopathy"    There were no vitals filed for this visit.   Subjective Assessment - 10/21/20 0843    Subjective Pt reports he is doing well. Has not contacted [employer] about AFO yet. PT did explain that  since his days were variable it would help on the more difficult days.    Pertinent History multiple falls, vitamin D insufficiency, Type 1 IDDM, diabetic retinopathy, hypothyroidism, HTN, hyperlipidemia, R tibia fracture, L ankle fracture with ORIF    Patient Stated Goals Wants to get back to working on his walking and strength.    Currently in Pain? No/denies                             [facility] Adult PT Treatment/Exercise - 10/21/20 0844      Ambulation/Gait   Ambulation/Gait Yes    Ambulation/Gait Assistance 5: Supervision;4: Min guard    Ambulation/Gait Assistance Details 1st bout with only right AFO. 2nd lap donned left posterior [place] AFO as well. Pt able to advance RLE better with left AFO but still getting caught with left foot clearance. He has periods of 4-6 good steps and then pauses to regroup. Hip hikes on right to help clear    Ambulation Distance (Feet) 115 Feet   115' x 1   Assistive device Lofstrands    Gait Pattern Step-through pattern;Step-to pattern;Decreased dorsiflexion - right;Decreased hip/knee flexion - right;Decreased hip/knee flexion - left    Ambulation Surface Level;Indoor      Neuro Re-ed    Neuro Re-ed Details  Tall kneeling: hip hinge  moving blue physioball forward each time with verbal cues to engage core throughout and really fully extend at top, hands on blue physioball stepping forward and back with each leg with verbal and tactile cues to weight shift over support leg and not flex at trunk x 6 each leg. Half kneeling with blue physioball in front: Max assist to obtain position with 2nd therapist assisting. 1 therapist helped stabilize at trunk while the other helped position the forward leg. Maintaining position with LLE in front x 30 sec with tactile cues to weight shift and for upright posture. Then with raising left UE up x 5 when right leg support leg. With RLE in front pt needed min/mod assist to maintain position with UE support on  physioball and assist to extend left hip and stabilize at right leg. Pt was cued to shift weight over left and straighten shoulders to right some. Held for 1 min in this position.                    PT Short Term Goals - 10/05/20 1951      PT SHORT TERM GOAL #1   Title Pt will ambulate up/down 6 stairs with railing mod I to safely enter home.    Time 4    Period Weeks    Status New    Target Date 11/01/20      PT SHORT TERM GOAL #2   Title Pt will be instructed on proper wear time when new AFO is received.    Baseline --    Time 4    Period Weeks    Status On-going    Target Date 11/01/20      PT SHORT TERM GOAL #3   Title --    Time --    Period --    Status --    Target Date --             PT Long Term Goals - 10/05/20 1955      PT LONG TERM GOAL #1   Title Pt will ambulate >300' with bilateral loftstrand crutches for improved mobility on level paved or grass surfaces to be able to get to his buidling out back safely CGA. ( LTGs due 11/25/20)    Baseline 09/29/20: min guard to min assist currently    Time 8    Period Weeks    Status On-going    Target Date 11/25/20      PT LONG TERM GOAL #2   Title Pt will increase Berg from 36/56 to >38/56 for improved balance.    Time 8    Period Weeks    Status Revised    Target Date 11/25/20      PT LONG TERM GOAL #3   Title Pt will increase speed from 0.45m/s (0.2ft/sec) to >0.37m/s for improved mobility with bilateral AFOs and walker.    Time 8    Period Weeks    Status On-going    Target Date 11/25/20                 Plan - 10/21/20 1106    Clinical Impression Statement Pt continues to be reluctant to pursue left AFO. PT did explain benefits being more for stance limb control. He was able to advance RLE better with left AFO but left foot clearance still caught at times. Pt was very challenged with half kneel position needing assist of 2 PTs to perform especially with RLE in front. Fatigued with  half  kneel activity.    Personal Factors and Comorbidities Comorbidity 3+;Time since onset of injury/illness/exacerbation;Past/Current Experience    Comorbidities multiple falls, vitamin D insufficiency, Type 1 IDDM, diabetic retinopathy, hypothyroidism, HTN, hyperlipidemia, R tibia fracture, L ankle fracture with ORIF    Examination-Activity Limitations Carry;Locomotion Level;Stairs;Stand;Transfers;Squat    Examination-Participation Restrictions Community Activity;Driving;Yard Work    [employer]chant [employer] officer Evolving/Moderate complexity    Rehab Potential Good    PT Frequency 2x / week   plus eval   PT Duration 8 weeks    PT Treatment/Interventions ADLs/Self Care Home Management;Aquatic Therapy;Electrical Stimulation;DME Instruction;Gait training;Stair training;Functional mobility training;Therapeutic activities;Therapeutic exercise;Balance training;Neuromuscular re-education;Patient/family education;Orthotic Fit/Training;Passive range of motion;Energy conservation;Manual techniques    PT Next Visit Plan 10th visit progress next visit. He has not called [employer]-is hesitant to get second AFO.  Continue with use of left posterior Thu[place] AFO. Gait with bilateral  loftstrand crutches. Balance with rockerboard, weight shifting during gait and greater stance control.  Glute strengthening.    Consulted and Agree with Plan of Care Patient           Patient will benefit from skilled therapeutic intervention in order to improve the following deficits and impairments:  Abnormal gait,Decreased balance,Decreased endurance,Decreased coordination,Decreased range of motion,Decreased strength,Difficulty walking,Impaired tone,Postural dysfunction,Decreased activity tolerance,Decreased mobility,Impaired flexibility,Increased edema  Visit Diagnosis: Other abnormalities of gait and mobility  Muscle weakness (generalized)     Problem List Patient Active Problem List   Diagnosis Date Noted  . OAB  (overactive bladder) 09/21/2020  . Conjunctival injection, right 09/21/2020  . Essential hypertension 09/21/2020  . Urinary dysfunction 09/21/2020  . Thoracic outlet syndrome 05/03/2020  . Controlled type 1 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 ([place]) 03/24/2020  . Right epiretinal membrane 03/24/2020  . Retinal exudates and deposits 03/24/2020  . 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 Nuclear sclerotic cataract of both eyes 03/24/2020  . Gait disorder 03/23/2020  . Benign prostatic hyperplasia without lower urinary tract symptoms 03/01/2020  . Diabetic gastroparesis associated with type 1 diabetes mellitus ([place]) 03/01/2020  . Uncontrolled type 1 diabetes mellitus with hyperglycemia ([place]) 03/01/2020  . Gait disturbance 08/19/2019  . Acquired diplegia ([place]) 08/19/2019  . Vitamin D insufficiency 11/29/2016  . Hypogonadism in male 11/01/2016  . 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[place])   . Hypothyroidism   . Hypertension   . Abnormal liver function tests 03/06/2013  . Type I (juvenile type) diabetes mellitus without mention of complication, uncontrolled 12/03/2007  . HYPERCHOLESTEROLEMIA 11/12/2007  . MULTIPLE SCLEROSIS 08/29/2007  . PROLIFERATIVE DIABETIC RETINOPATHY 08/29/2007    Electa Sniff, PT, DPT, NCS 10/21/2020, 11:10 AM  [facility] [address] [place], [place], [address] Phone: [phone]   Fax:  [phone]  Name: Johnathan Ross MRN: [id number] Date of Birth: 1971-04-08

## 2020-10-25 ENCOUNTER — Other Ambulatory Visit: Payer: Self-pay

## 2020-10-25 ENCOUNTER — Ambulatory Visit: Payer: Medicare Other | Admitting: Physical Therapy

## 2020-10-25 ENCOUNTER — Encounter: Payer: Self-pay | Admitting: Physical Therapy

## 2020-10-25 DIAGNOSIS — M21371 Foot drop, right foot: Secondary | ICD-10-CM

## 2020-10-25 DIAGNOSIS — R2681 Unsteadiness on feet: Secondary | ICD-10-CM | POA: Diagnosis not present

## 2020-10-25 DIAGNOSIS — R2689 Other abnormalities of gait and mobility: Secondary | ICD-10-CM

## 2020-10-25 DIAGNOSIS — M21372 Foot drop, left foot: Secondary | ICD-10-CM | POA: Diagnosis not present

## 2020-10-25 DIAGNOSIS — R296 Repeated falls: Secondary | ICD-10-CM | POA: Diagnosis not present

## 2020-10-25 DIAGNOSIS — M6281 Muscle weakness (generalized): Secondary | ICD-10-CM

## 2020-10-25 DIAGNOSIS — R262 Difficulty in walking, not elsewhere classified: Secondary | ICD-10-CM

## 2020-10-25 NOTE — Therapy (Signed)
[facility] [address] [place], [place], [address] Phone: [phone]   Fax:  [phone]  Physical Therapy Treatment and 10th Visit Progress Note  Patient Details  Name: Johnathan Ross MRN: [id number] Date of Birth: [birth year] Referring Provider (PT): Felecia Shelling, Nanine Means, MD   Encounter Date: 10/25/2020  Progress Note  Reporting Period 09/08/2020 to 10/25/2020  See note below for Objective Data and Assessment of Progress/Goals.     PT End of Session - 10/25/20 1221    Visit Number 20    Number of Visits 25    Date for PT Re-Evaluation 12/01/20   60 day POC, 90 day cert   Authorization Type Medicare Part A and B; 10th visit PN - KX MODIFIER NEEDED NOW    Progress Note Due on Visit 30    PT Start Time 0845    PT Stop Time 0930    PT Time Calculation (min) 45 min    Equipment Utilized During Treatment Other (comment)   lofstrands, pt's right AFO   Activity Tolerance Patient tolerated treatment well    Behavior During Therapy WFL for tasks assessed/performed           Past Medical History:  Diagnosis Date  . Hyperlipidemia   . Hypertension   . Hypogonadism in male 11/01/2016  . Hypothyroidism   . Multiple sclerosis ([place])   . Proliferative diabetic retinopathy(362.02)   . Type 1 diabetes mellitus ([place]) dx'd 1994  . Vitamin D insufficiency 11/29/2016    Past Surgical History:  Procedure Laterality Date  . EYE SURGERY Bilateral    "laser for diabetic retinopathy"  . FRACTURE SURGERY    . IR VENO/EXT/UNI LEFT  03/24/2020  . OPEN REDUCTION INTERNAL FIXATION (ORIF) TIBIA/FIBULA FRACTURE Right 10/30/2016  . ORIF ANKLE FRACTURE Left 2015  . ORIF TIBIA FRACTURE Right 10/30/2016   Procedure: OPEN REDUCTION INTERNAL FIXATION (ORIF) TIBIA FIBULA FRACTURE;  Surgeon: Altamese [place], MD;  Location: [facility];  Service: Orthopedics;  Laterality: Right;  . RETINAL LASER PROCEDURE Bilateral    "for diabetic retinopathy"     There were no vitals filed for this visit.   Subjective Assessment - 10/25/20 0849    Subjective Pt reports washing his car yesterday morning and having increased tone in feet and LE; continued even when he sat and rested.  Not drinking much water when outside working.  Considering the AFO for difficult days.    Pertinent History multiple falls, vitamin D insufficiency, Type 1 IDDM, diabetic retinopathy, hypothyroidism, HTN, hyperlipidemia, R tibia fracture, L ankle fracture with ORIF    Patient Stated Goals Wants to get back to working on his walking and strength.    Currently in Pain? No/denies                             [facility] Adult PT Treatment/Exercise - 10/25/20 1217      Ambulation/Gait   Ambulation/Gait Yes    Ambulation/Gait Assistance 4: Min guard    Ambulation/Gait Assistance Details after NMR; focus on forward/diagonal weight shifting, full step length to allow for hip extension on stance LE to facilitate hip flexion activation during swing phase.  Improved step length R and LLE noted; only 2-3 episodes of toes catching and needing to stand and rest    Ambulation Distance (Feet) 115 Feet    Assistive device Lofstrands    Gait Pattern Step-through pattern;Step-to pattern;Decreased dorsiflexion - right;Decreased hip/knee flexion -  right;Decreased hip/knee flexion - left    Ambulation Surface Level;Indoor      Neuro Re-ed    Neuro Re-ed Details  Half kneeling on floor on mat with back leg elevated on blue foam to improve LE clearance when bringing other foot forwards.  Intermittent UE support on mat beside patient; therapist on contralateral side providing assistance for balance and safety, trunk control and cues for upright posture, weight shifting and muscle activation.  Performed on R and L side: shifting forwards and holding for hip flexor stretch x 3-4 reps each side x 30 seconds; active and controlled weight shift forwards and back with and then without UE  support; holding balance in midline with front LE pushing into knee extension and back leg pressing into hip extension to provide pt with increased stability x 10-20 seconds x 5 reps each side; weight shifting to back leg while activating hip flexion to lift front foot off floor x 5 reps each side with mod A from therapist.                  PT Education - 10/25/20 1223    Education Details continued to encourage pt to consider AFO for LLE for days when he is experiencing more difficulty ambulating; educated pt on water intake especially when working outside in heat and sweating due to electrolyte loss and need to replenish for muscles    Person(s) Educated Patient    Methods Explanation    Comprehension Verbalized understanding            PT Short Term Goals - 10/05/20 1951      PT SHORT TERM GOAL #1   Title Pt will ambulate up/down 6 stairs with railing mod I to safely enter home.    Time 4    Period Weeks    Status New    Target Date 11/01/20      PT SHORT TERM GOAL #2   Title Pt will be instructed on proper wear time when new AFO is received.    Baseline --    Time 4    Period Weeks    Status On-going    Target Date 11/01/20      PT SHORT TERM GOAL #3   Title --    Time --    Period --    Status --    Target Date --             PT Long Term Goals - 10/05/20 1955      PT LONG TERM GOAL #1   Title Pt will ambulate >300' with bilateral loftstrand crutches for improved mobility on level paved or grass surfaces to be able to get to his buidling out back safely CGA. ( LTGs due 11/25/20)    Baseline 09/29/20: min guard to min assist currently    Time 8    Period Weeks    Status On-going    Target Date 11/25/20      PT LONG TERM GOAL #2   Title Pt will increase Berg from 36/56 to >38/56 for improved balance.    Time 8    Period Weeks    Status Revised    Target Date 11/25/20      PT LONG TERM GOAL #3   Title Pt will increase speed from 0.19m/s (0.39ft/sec)  to >0.5m/s for improved mobility with bilateral AFOs and walker.    Time 8    Period Weeks    Status On-going  Target Date 11/25/20                 Plan - 10/25/20 2039    Clinical Impression Statement Continued to discuss and encourage pt to consider second AFO to improve stability, gait efficiency and energy conservation.  Pt able to return to half kneeling today and perform multiple exercises focusing on hip ROM, strengthening and stability with one UE support on more stable surface and one therapist assistance.  Pt continues to make progress with gait with lofstrand crutches and demonstrated improved LLE clearance and step length and required fewer rest breaks today; would still benefit from AFO for LLE to improve L stance phase stability and allow for increased RLE step length and clearance.  Will continue to practice and discuss with pt and will continue to progress towards LTG.    Personal Factors and Comorbidities Comorbidity 3+;Time since onset of injury/illness/exacerbation;Past/Current Experience    Comorbidities multiple falls, vitamin D insufficiency, Type 1 IDDM, diabetic retinopathy, hypothyroidism, HTN, hyperlipidemia, R tibia fracture, L ankle fracture with ORIF    Examination-Activity Limitations Carry;Locomotion Level;Stairs;Stand;Transfers;Squat    Examination-Participation Restrictions Community Activity;Driving;Yard Work    [employer]chant [employer] officer Evolving/Moderate complexity    Rehab Potential Good    PT Frequency 2x / week   plus eval   PT Duration 8 weeks    PT Treatment/Interventions ADLs/Self Care Home Management;Aquatic Therapy;Electrical Stimulation;DME Instruction;Gait training;Stair training;Functional mobility training;Therapeutic activities;Therapeutic exercise;Balance training;Neuromuscular re-education;Patient/family education;Orthotic Fit/Training;Passive range of motion;Energy conservation;Manual techniques    PT Next Visit Plan Ask again  about L AFO.  half kneeling on floor with back knee elevated slightly on blue foam.  Continue with use of left posterior [place] AFO. Gait with bilateral  loftstrand crutches. Balance with rockerboard, weight shifting during gait and greater stance control.  Glute strengthening.    Consulted and Agree with Plan of Care Patient           Patient will benefit from skilled therapeutic intervention in order to improve the following deficits and impairments:  Abnormal gait,Decreased balance,Decreased endurance,Decreased coordination,Decreased range of motion,Decreased strength,Difficulty walking,Impaired tone,Postural dysfunction,Decreased activity tolerance,Decreased mobility,Impaired flexibility,Increased edema  Visit Diagnosis: Other abnormalities of gait and mobility  Muscle weakness (generalized)  Unsteadiness on feet  Repeated falls  Difficulty in walking, not elsewhere classified  Foot drop, left  Foot drop, right     Problem List Patient Active Problem List   Diagnosis Date Noted  . OAB (overactive bladder) 09/21/2020  . Conjunctival injection, right 09/21/2020  . Essential hypertension 09/21/2020  . Urinary dysfunction 09/21/2020  . Thoracic outlet syndrome 05/03/2020  . Controlled type 1 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 ([place]) 03/24/2020  . Right epiretinal membrane 03/24/2020  . Retinal exudates and deposits 03/24/2020  . 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 Nuclear sclerotic cataract of both eyes 03/24/2020  . Gait disorder 03/23/2020  . Benign prostatic hyperplasia without lower urinary tract symptoms 03/01/2020  . Diabetic gastroparesis associated with type 1 diabetes mellitus ([place]) 03/01/2020  . Uncontrolled type 1 diabetes mellitus with hyperglycemia ([place]) 03/01/2020  . Gait disturbance 08/19/2019  . Acquired diplegia ([place]) 08/19/2019  . Vitamin D insufficiency 11/29/2016  . Hypogonadism in male 11/01/2016  .  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[place])   . Hypothyroidism   . Hypertension   . Abnormal liver function tests 03/06/2013  . Type I (juvenile type) diabetes mellitus without mention of complication, uncontrolled 12/03/2007  . HYPERCHOLESTEROLEMIA 11/12/2007  . MULTIPLE SCLEROSIS 08/29/2007  .  PROLIFERATIVE DIABETIC RETINOPATHY 08/29/2007    [name], PT, DPT 10/25/20    8:46 PM    [facility] [address] [place], [place], [address] Phone: [phone]   Fax:  [phone]  Name: Johnathan Ross MRN: [id number] Date of Birth: May 26, 1971

## 2020-10-27 ENCOUNTER — Other Ambulatory Visit: Payer: Self-pay

## 2020-10-27 ENCOUNTER — Ambulatory Visit: Payer: Medicare Other | Attending: Neurology

## 2020-10-27 DIAGNOSIS — R2689 Other abnormalities of gait and mobility: Secondary | ICD-10-CM | POA: Diagnosis not present

## 2020-10-27 DIAGNOSIS — R296 Repeated falls: Secondary | ICD-10-CM | POA: Diagnosis not present

## 2020-10-27 DIAGNOSIS — M21372 Foot drop, left foot: Secondary | ICD-10-CM | POA: Diagnosis not present

## 2020-10-27 DIAGNOSIS — R2681 Unsteadiness on feet: Secondary | ICD-10-CM | POA: Insufficient documentation

## 2020-10-27 DIAGNOSIS — R262 Difficulty in walking, not elsewhere classified: Secondary | ICD-10-CM | POA: Insufficient documentation

## 2020-10-27 DIAGNOSIS — M6281 Muscle weakness (generalized): Secondary | ICD-10-CM | POA: Insufficient documentation

## 2020-10-27 DIAGNOSIS — M21371 Foot drop, right foot: Secondary | ICD-10-CM | POA: Insufficient documentation

## 2020-10-27 NOTE — Therapy (Signed)
[facility] [address] [place], [place], [address] Phone: [phone]   Fax:  [phone]  Physical Therapy Treatment  Patient Details  Name: Johnathan Ross MRN: [id number] Date of Birth: 05-Feb-1971 Referring Provider (PT): Felecia Shelling, Nanine Means, MD   Encounter Date: 10/27/2020   PT End of Session - 10/27/20 0856    Visit Number 21    Number of Visits 25    Date for PT Re-Evaluation 12/01/20   60 day POC, 90 day cert   Authorization Type Medicare Part A and B; 10th visit PN - KX MODIFIER NEEDED NOW    Progress Note Due on Visit 30    PT Start Time 0850    PT Stop Time 0930    PT Time Calculation (min) 40 min    Equipment Utilized During Treatment Other (comment)   lofstrands, pt's right AFO   Activity Tolerance Patient tolerated treatment well    Behavior During Therapy [facility] for tasks assessed/performed           Past Medical History:  Diagnosis Date  . Hyperlipidemia   . Hypertension   . Hypogonadism in male 11/01/2016  . Hypothyroidism   . Multiple sclerosis ([place])   . Proliferative diabetic retinopathy(362.02)   . Type 1 diabetes mellitus ([place]) dx'd 1994  . Vitamin D insufficiency 11/29/2016    Past Surgical History:  Procedure Laterality Date  . EYE SURGERY Bilateral    "laser for diabetic retinopathy"  . FRACTURE SURGERY    . IR VENO/EXT/UNI LEFT  03/24/2020  . OPEN REDUCTION INTERNAL FIXATION (ORIF) TIBIA/FIBULA FRACTURE Right 10/30/2016  . ORIF ANKLE FRACTURE Left 2015  . ORIF TIBIA FRACTURE Right 10/30/2016   Procedure: OPEN REDUCTION INTERNAL FIXATION (ORIF) TIBIA FIBULA FRACTURE;  Surgeon: Altamese [place], MD;  Location: [facility];  Service: Orthopedics;  Laterality: Right;  . RETINAL LASER PROCEDURE Bilateral    "for diabetic retinopathy"    There were no vitals filed for this visit.   Subjective Assessment - 10/27/20 0857    Subjective Pt denies any new issues. Is using lofstrands when goes out to  shed but not using anything inside other than touching to things.    Pertinent History multiple falls, vitamin D insufficiency, Type 1 IDDM, diabetic retinopathy, hypothyroidism, HTN, hyperlipidemia, R tibia fracture, L ankle fracture with ORIF    Patient Stated Goals Wants to get back to working on his walking and strength.    Currently in Pain? No/denies                             [facility] Adult PT Treatment/Exercise - 10/27/20 0858      Ambulation/Gait   Ambulation/Gait Yes    Ambulation/Gait Assistance 4: Min guard    Ambulation/Gait Assistance Details Pt was given verbal and tactile cues to push through stance limb to get more hip extension to help with contralateral leg clearance. Pt did have more episodes of LLE catching with swing today especially as went on.    Ambulation Distance (Feet) 230 Feet    Assistive device Lofstrands    Gait Pattern Step-through pattern;Step-to pattern;Decreased hip/knee flexion - left;Decreased dorsiflexion - left    Ambulation Surface Level;Indoor      Neuro Re-ed    Neuro Re-ed Details  In // bars: stepping with 1 LE on rockerboard positioned ant/post bringing other foot up x 10 then switched sides. Pt was cued to shift weight onto stance  limb and try to stay up tall. PT assisted minimally a couple time to step LLE up trying to initiate some left knee flexion and improve toe clearance. At pt fatigued he started to compensate more to clear. Gait forwards and backwards in // bars with visual cues in mirror for erect posture both ways 8' x 4.                    PT Short Term Goals - 10/05/20 1951      PT SHORT TERM GOAL #1   Title Pt will ambulate up/down 6 stairs with railing mod I to safely enter home.    Time 4    Period Weeks    Status New    Target Date 11/01/20      PT SHORT TERM GOAL #2   Title Pt will be instructed on proper wear time when new AFO is received.    Baseline --    Time 4    Period Weeks    Status  On-going    Target Date 11/01/20      PT SHORT TERM GOAL #3   Title --    Time --    Period --    Status --    Target Date --             PT Long Term Goals - 10/05/20 1955      PT LONG TERM GOAL #1   Title Pt will ambulate >300' with bilateral loftstrand crutches for improved mobility on level paved or grass surfaces to be able to get to his buidling out back safely CGA. ( LTGs due 11/25/20)    Baseline 09/29/20: min guard to min assist currently    Time 8    Period Weeks    Status On-going    Target Date 11/25/20      PT LONG TERM GOAL #2   Title Pt will increase Berg from 36/56 to >38/56 for improved balance.    Time 8    Period Weeks    Status Revised    Target Date 11/25/20      PT LONG TERM GOAL #3   Title Pt will increase speed from 0.24m/s (0.41ft/sec) to >0.26m/s for improved mobility with bilateral AFOs and walker.    Time 8    Period Weeks    Status On-going    Target Date 11/25/20                 Plan - 10/27/20 1040    Clinical Impression Statement Pt able to increase gait distance but did have more difficulty clearing legs during swing. PT focused on weight shifting activities to try to initiate more hip extension to help with clearance. More weakness at right hip with pelvic drop noted during stance.    Personal Factors and Comorbidities Comorbidity 3+;Time since onset of injury/illness/exacerbation;Past/Current Experience    Comorbidities multiple falls, vitamin D insufficiency, Type 1 IDDM, diabetic retinopathy, hypothyroidism, HTN, hyperlipidemia, R tibia fracture, L ankle fracture with ORIF    Examination-Activity Limitations Carry;Locomotion Level;Stairs;Stand;Transfers;Squat    Examination-Participation Restrictions Community Activity;Driving;Yard Work    Merchant navy officer Evolving/Moderate complexity    Rehab Potential Good    PT Frequency 2x / week   plus eval   PT Duration 8 weeks    PT Treatment/Interventions ADLs/Self  Care Home Management;Aquatic Therapy;Electrical Stimulation;DME Instruction;Gait training;Stair training;Functional mobility training;Therapeutic activities;Therapeutic exercise;Balance training;Neuromuscular re-education;Patient/family education;Orthotic Fit/Training;Passive range of motion;Energy conservation;Manual techniques    PT  Next Visit Plan KX MODIFIER.  Check STGs next visit. Ask again about L AFO.  half kneeling on floor with back knee elevated slightly on blue foam.  Continue with use of left posterior [place] AFO. Gait with bilateral  loftstrand crutches. Balance with rockerboard, weight shifting during gait and greater stance control.  Glute strengthening.    Consulted and Agree with Plan of Care Patient           Patient will benefit from skilled therapeutic intervention in order to improve the following deficits and impairments:  Abnormal gait,Decreased balance,Decreased endurance,Decreased coordination,Decreased range of motion,Decreased strength,Difficulty walking,Impaired tone,Postural dysfunction,Decreased activity tolerance,Decreased mobility,Impaired flexibility,Increased edema  Visit Diagnosis: Other abnormalities of gait and mobility  Muscle weakness (generalized)     Problem List Patient Active Problem List   Diagnosis Date Noted  . OAB (overactive bladder) 09/21/2020  . Conjunctival injection, right 09/21/2020  . Essential hypertension 09/21/2020  . Urinary dysfunction 09/21/2020  . Thoracic outlet syndrome 05/03/2020  . Controlled type 1 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 ([place]) 03/24/2020  . Right epiretinal membrane 03/24/2020  . Retinal exudates and deposits 03/24/2020  . 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 Nuclear sclerotic cataract of both eyes 03/24/2020  . Gait disorder 03/23/2020  . Benign prostatic hyperplasia without lower urinary tract symptoms 03/01/2020  . Diabetic gastroparesis associated with type  1 diabetes mellitus ([place]) 03/01/2020  . Uncontrolled type 1 diabetes mellitus with hyperglycemia ([place]) 03/01/2020  . Gait disturbance 08/19/2019  . Acquired diplegia ([place]) 08/19/2019  . Vitamin D insufficiency 11/29/2016  . Hypogonadism in male 11/01/2016  . 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[place])   . Hypothyroidism   . Hypertension   . Abnormal liver function tests 03/06/2013  . Type I (juvenile type) diabetes mellitus without mention of complication, uncontrolled 12/03/2007  . HYPERCHOLESTEROLEMIA 11/12/2007  . MULTIPLE SCLEROSIS 08/29/2007  . PROLIFERATIVE DIABETIC RETINOPATHY 08/29/2007    Electa Sniff, PT, DPT, NCS 10/27/2020, 10:43 AM  [facility] [address] [place], [place], [address] Phone: [phone]   Fax:  [phone]  Name: Johnathan Ross MRN: [id number] Date of Birth: 08-16-1970

## 2020-11-01 ENCOUNTER — Other Ambulatory Visit: Payer: Self-pay

## 2020-11-01 ENCOUNTER — Ambulatory Visit: Payer: Medicare Other

## 2020-11-01 DIAGNOSIS — R296 Repeated falls: Secondary | ICD-10-CM | POA: Diagnosis not present

## 2020-11-01 DIAGNOSIS — R2681 Unsteadiness on feet: Secondary | ICD-10-CM | POA: Diagnosis not present

## 2020-11-01 DIAGNOSIS — M6281 Muscle weakness (generalized): Secondary | ICD-10-CM

## 2020-11-01 DIAGNOSIS — M21371 Foot drop, right foot: Secondary | ICD-10-CM | POA: Diagnosis not present

## 2020-11-01 DIAGNOSIS — R2689 Other abnormalities of gait and mobility: Secondary | ICD-10-CM | POA: Diagnosis not present

## 2020-11-01 DIAGNOSIS — R262 Difficulty in walking, not elsewhere classified: Secondary | ICD-10-CM | POA: Diagnosis not present

## 2020-11-01 NOTE — Therapy (Signed)
[facility] [address] [place], [place], [address] Phone: [phone]   Fax:  [phone]  Physical Therapy Treatment  Patient Details  Name: Johnathan Ross MRN: [id number] Date of Birth: Sep 09, 1970 Referring Provider (PT): Felecia Shelling, Nanine Means, MD   Encounter Date: 11/01/2020   PT End of Session - 11/01/20 0851    Visit Number 22    Number of Visits 25    Date for PT Re-Evaluation 12/01/20   60 day POC, 90 day cert   Authorization Type Medicare Part A and B; 10th visit PN - KX MODIFIER NEEDED NOW    Progress Note Due on Visit 30    PT Start Time 0846    PT Stop Time 0929    PT Time Calculation (min) 43 min    Equipment Utilized During Treatment Other (comment)   lofstrands, pt's right AFO   Activity Tolerance Patient tolerated treatment well    Behavior During Therapy [facility] for tasks assessed/performed           Past Medical History:  Diagnosis Date  . Hyperlipidemia   . Hypertension   . Hypogonadism in male 11/01/2016  . Hypothyroidism   . Multiple sclerosis ([place])   . Proliferative diabetic retinopathy(362.02)   . Type 1 diabetes mellitus ([place]) dx'd 1994  . Vitamin D insufficiency 11/29/2016    Past Surgical History:  Procedure Laterality Date  . EYE SURGERY Bilateral    "laser for diabetic retinopathy"  . FRACTURE SURGERY    . IR VENO/EXT/UNI LEFT  03/24/2020  . OPEN REDUCTION INTERNAL FIXATION (ORIF) TIBIA/FIBULA FRACTURE Right 10/30/2016  . ORIF ANKLE FRACTURE Left 2015  . ORIF TIBIA FRACTURE Right 10/30/2016   Procedure: OPEN REDUCTION INTERNAL FIXATION (ORIF) TIBIA FIBULA FRACTURE;  Surgeon: Altamese [place], MD;  Location: [facility];  Service: Orthopedics;  Laterality: Right;  . RETINAL LASER PROCEDURE Bilateral    "for diabetic retinopathy"    There were no vitals filed for this visit.   Subjective Assessment - 11/01/20 0851    Subjective Pt reports no new issues. He was outside a lot this weekend.     Pertinent History multiple falls, vitamin D insufficiency, Type 1 IDDM, diabetic retinopathy, hypothyroidism, HTN, hyperlipidemia, R tibia fracture, L ankle fracture with ORIF    Patient Stated Goals Wants to get back to working on his walking and strength.    Currently in Pain? No/denies                             [facility] Adult PT Treatment/Exercise - 11/01/20 3875      Ambulation/Gait   Ambulation/Gait Yes    Ambulation/Gait Assistance 6: Modified independent (Device/Increase time)    Ambulation/Gait Assistance Details in/out of clinic with FWW    Assistive device Rolling walker   right AFO   Gait Pattern Step-through pattern;Decreased hip/knee flexion - right;Decreased hip/knee flexion - left    Ambulation Surface Level;Indoor    Stairs Yes    Stairs Assistance 6: Modified independent (Device/Increase time)    Stairs Assistance Details (indicate cue type and reason) Pt performed step-to pattern up but was able to do 2 at a time when led with RLE first having to lean to clear right then getting LLE to next step. Reciprocal pattern down.    Stair Management Technique Two rails;Alternating pattern;Step to pattern    Number of Stairs 8    Height of Stairs 6  Neuro Re-ed    Neuro Re-ed Details  In // bars with red theraband around thights to try to encourage more hip abductor activation: gait 8' x 8 with verbal and visual cues to keep pelvis level with weight shift. Sidstepping 8' x 4. More challenged with going to left. Standing with 1 foot on 4" step trying to keep slight bend in posterior leg 30 sec x 2 each position without UE support. Needed assistance to get LLE on step. Staggered stance keeping slight bend in knees with alternating shoulder flexion with 5# weight x 10 each side. Less stable with RLE posterior with CGA                    PT Short Term Goals - 11/01/20 1932      PT SHORT TERM GOAL #1   Title Pt will ambulate up/down 6 stairs with  railing mod I to safely enter home.    Baseline 11/01/20 8 steps with railing mod I    Time 4    Period Weeks    Status Achieved    Target Date 11/01/20      PT SHORT TERM GOAL #2   Title Pt will be instructed on proper wear time when new AFO is received.    Baseline Pt has not yet pursued left AFO further. Has been given order from MD    Time 4    Period Weeks    Status Deferred    Target Date 11/01/20             PT Long Term Goals - 10/05/20 1955      PT LONG TERM GOAL #1   Title Pt will ambulate >300' with bilateral loftstrand crutches for improved mobility on level paved or grass surfaces to be able to get to his buidling out back safely CGA. ( LTGs due 11/25/20)    Baseline 09/29/20: min guard to min assist currently    Time 8    Period Weeks    Status On-going    Target Date 11/25/20      PT LONG TERM GOAL #2   Title Pt will increase Berg from 36/56 to >38/56 for improved balance.    Time 8    Period Weeks    Status Revised    Target Date 11/25/20      PT LONG TERM GOAL #3   Title Pt will increase speed from 0.81ms (0.690fsec) to >0.22m91mfor improved mobility with bilateral AFOs and walker.    Time 8    Period Weeks    Status On-going    Target Date 11/25/20                 Plan - 11/01/20 1933    Clinical Impression Statement Pt met stair goal with use of railing. He has not pursused getting left AFO further but had been given the MD order a few weeks back. PT focused rest of session on trying to get more hip abductor activation while keeping slight bend in knees with gait and balance activitites.    Personal Factors and Comorbidities Comorbidity 3+;Time since onset of injury/illness/exacerbation;Past/Current Experience    Comorbidities multiple falls, vitamin D insufficiency, Type 1 IDDM, diabetic retinopathy, hypothyroidism, HTN, hyperlipidemia, R tibia fracture, L ankle fracture with ORIF    Examination-Activity Limitations Carry;Locomotion  Level;Stairs;Stand;Transfers;Squat    Examination-Participation Restrictions Community Activity;Driving;Yard Work    StaMerchant navy officerolving/Moderate complexity    Rehab Potential Good  PT Frequency 2x / week   followed by 1x/week for 4 weeks   PT Duration 4 weeks    PT Treatment/Interventions ADLs/Self Care Home Management;Aquatic Therapy;Electrical Stimulation;DME Instruction;Gait training;Stair training;Functional mobility training;Therapeutic activities;Therapeutic exercise;Balance training;Neuromuscular re-education;Patient/family education;Orthotic Fit/Training;Passive range of motion;Energy conservation;Manual techniques    PT Next Visit Plan KX MODIFIER.  Pt has not pursued obtaining left posterior [place] AFO further.  half kneeling on floor with back knee elevated slightly on blue foam.  Gait with bilateral  loftstrand crutches. Balance with rockerboard, weight shifting during gait and greater stance control.  Glute strengthening.    Consulted and Agree with Plan of Care Patient           Patient will benefit from skilled therapeutic intervention in order to improve the following deficits and impairments:  Abnormal gait,Decreased balance,Decreased endurance,Decreased coordination,Decreased range of motion,Decreased strength,Difficulty walking,Impaired tone,Postural dysfunction,Decreased activity tolerance,Decreased mobility,Impaired flexibility,Increased edema  Visit Diagnosis: Other abnormalities of gait and mobility  Muscle weakness (generalized)     Problem List Patient Active Problem List   Diagnosis Date Noted  . OAB (overactive bladder) 09/21/2020  . Conjunctival injection, right 09/21/2020  . Essential hypertension 09/21/2020  . Urinary dysfunction 09/21/2020  . Thoracic outlet syndrome 05/03/2020  . Controlled type 1 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 ([place]) 03/24/2020  . Right epiretinal membrane 03/24/2020  .  Retinal exudates and deposits 03/24/2020  . 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 Nuclear sclerotic cataract of both eyes 03/24/2020  . Gait disorder 03/23/2020  . Benign prostatic hyperplasia without lower urinary tract symptoms 03/01/2020  . Diabetic gastroparesis associated with type 1 diabetes mellitus ([place]) 03/01/2020  . Uncontrolled type 1 diabetes mellitus with hyperglycemia ([place]) 03/01/2020  . Gait disturbance 08/19/2019  . Acquired diplegia ([place]) 08/19/2019  . Vitamin D insufficiency 11/29/2016  . Hypogonadism in male 11/01/2016  . 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[place])   . Hypothyroidism   . Hypertension   . Abnormal liver function tests 03/06/2013  . Type I (juvenile type) diabetes mellitus without mention of complication, uncontrolled 12/03/2007  . HYPERCHOLESTEROLEMIA 11/12/2007  . MULTIPLE SCLEROSIS 08/29/2007  . PROLIFERATIVE DIABETIC RETINOPATHY 08/29/2007    Hermelinda Dellen, DPT, NCS 11/01/2020, 7:43 PM  [facility] [address] [place], [place], [address] Phone: [phone]   Fax:  [phone]  Name: Johnathan Ross MRN: [id number] Date of Birth: [birth year]

## 2020-11-08 ENCOUNTER — Ambulatory Visit: Payer: Medicare Other | Admitting: Physical Therapy

## 2020-11-08 ENCOUNTER — Encounter: Payer: Self-pay | Admitting: Physical Therapy

## 2020-11-08 ENCOUNTER — Other Ambulatory Visit: Payer: Self-pay

## 2020-11-08 DIAGNOSIS — R2689 Other abnormalities of gait and mobility: Secondary | ICD-10-CM | POA: Diagnosis not present

## 2020-11-08 DIAGNOSIS — R296 Repeated falls: Secondary | ICD-10-CM

## 2020-11-08 DIAGNOSIS — R2681 Unsteadiness on feet: Secondary | ICD-10-CM

## 2020-11-08 DIAGNOSIS — M6281 Muscle weakness (generalized): Secondary | ICD-10-CM | POA: Diagnosis not present

## 2020-11-08 DIAGNOSIS — M21371 Foot drop, right foot: Secondary | ICD-10-CM | POA: Diagnosis not present

## 2020-11-08 DIAGNOSIS — R262 Difficulty in walking, not elsewhere classified: Secondary | ICD-10-CM | POA: Diagnosis not present

## 2020-11-08 NOTE — Therapy (Signed)
[facility] [address] [place], [place], [address] Phone: [phone]   Fax:  [phone]  Physical Therapy Treatment  Patient Details  Name: Johnathan Ross MRN: [id number] Date of Birth: 1971-01-22 Referring Provider (PT): Britt Bottom, MD   Encounter Date: 11/08/2020   PT End of Session - 11/08/20 0958     Visit Number 23    Number of Visits 30    Date for PT Re-Evaluation 12/01/20   60 day POC, 90 day cert   Authorization Type Medicare Part A and B; 10th visit PN - KX MODIFIER NEEDED NOW    Progress Note Due on Visit 63    PT Start Time 0853    PT Stop Time 0935    PT Time Calculation (min) 42 min    Activity Tolerance Patient tolerated treatment well    Behavior During Therapy [facility] for tasks assessed/performed             Past Medical History:  Diagnosis Date   Hyperlipidemia    Hypertension    Hypogonadism in male 11/01/2016   Hypothyroidism    Multiple sclerosis ([place])    Proliferative diabetic retinopathy(362.02)    Type 1 diabetes mellitus ([place]) dx'd 1994   Vitamin D insufficiency 11/29/2016    Past Surgical History:  Procedure Laterality Date   EYE SURGERY Bilateral    "laser for diabetic retinopathy"   FRACTURE SURGERY     IR VENO/EXT/UNI LEFT  03/24/2020   OPEN REDUCTION INTERNAL FIXATION (ORIF) TIBIA/FIBULA FRACTURE Right 10/30/2016   ORIF ANKLE FRACTURE Left 2015   ORIF TIBIA FRACTURE Right 10/30/2016   Procedure: OPEN REDUCTION INTERNAL FIXATION (ORIF) TIBIA FIBULA FRACTURE;  Surgeon: Altamese [place] Bridge, MD;  Location: [facility];  Service: Orthopedics;  Laterality: Right;   RETINAL LASER PROCEDURE Bilateral    "for diabetic retinopathy"    There were no vitals filed for this visit.   Subjective Assessment - 11/08/20 0857     Subjective No issues to report.  Outside a lot this weekend; allergies worse this year.    Pertinent History multiple falls, vitamin D insufficiency, Type 1 IDDM,  diabetic retinopathy, hypothyroidism, HTN, hyperlipidemia, R tibia fracture, L ankle fracture with ORIF    Patient Stated Goals Wants to get back to working on his walking and strength.    Currently in Pain? No/denies                [facility] PT Assessment - 11/08/20 1204       Assessment   Medical Diagnosis MS    Referring Provider (PT) Sater, Nanine Means, MD    Onset Date/Surgical Date 07/14/20    Hand Dominance Right                           [facility] Adult PT Treatment/Exercise - 11/08/20 1204       Ambulation/Gait   Ambulation/Gait Yes    Ambulation/Gait Assistance 6: Modified independent (Device/Increase time);4: Min guard    Ambulation/Gait Assistance Details in/out of clinic with RW with no episodes of genu recurvatum or toe drag.  Pt likely will not proceed with L AFO; placed heel wedge in L shoe to increase tip tibia more anterior while performing gait around gym with lofstrand crutches.  Pt demonstrating improved weight shifting and LE clearance with swing phase; increased episodes of toe drag when turning around track    Ambulation Distance (Feet) 230 Feet  Assistive device Rolling walker;Lofstrands    Gait Pattern Step-through pattern;Decreased dorsiflexion - right;Decreased dorsiflexion - left;Right genu recurvatum;Poor foot clearance - left;Poor foot clearance - right    Ambulation Surface Level;Indoor    Pre-Gait Activities Heel wedge placed and left in L shoe                 Balance Exercises - 11/08/20 1208       Balance Exercises: Standing   Turning Right;Left;10 reps;Limitations    Turning Limitations standing at counter, bilat UE support performing 90 deg turns out for stance phase ER x 5 reps and then releasing one UE support when turning x 5 reps each direction.  Cued pt to turn head with stepping to increase rotation.  Changed to stepping across midline to R and L x 5 reps with bilat UE support and then with one UE support x 5 reps for  stance LE internal rotation.  Greater difficulty with closed chain rotation when standing on RLE.               PT Education - 11/08/20 1012     Education Details purpose of heel wedge in place of AFO; will increase frequency to 2x/week for the next 3 weeks              PT Short Term Goals - 11/01/20 1932       PT SHORT TERM GOAL #1   Title Pt will ambulate up/down 6 stairs with railing mod I to safely enter home.    Baseline 11/01/20 8 steps with railing mod I    Time 4    Period Weeks    Status Achieved    Target Date 11/01/20      PT SHORT TERM GOAL #2   Title Pt will be instructed on proper wear time when new AFO is received.    Baseline Pt has not yet pursued left AFO further. Has been given order from MD    Time 4    Period Weeks    Status Deferred    Target Date 11/01/20               PT Long Term Goals - 10/05/20 1955       PT LONG TERM GOAL #1   Title Pt will ambulate >300' with bilateral loftstrand crutches for improved mobility on level paved or grass surfaces to be able to get to his buidling out back safely CGA. ( LTGs due 11/25/20)    Baseline 09/29/20: min guard to min assist currently    Time 8    Period Weeks    Status On-going    Target Date 11/25/20      PT LONG TERM GOAL #2   Title Pt will increase Berg from 36/56 to >38/56 for improved balance.    Time 8    Period Weeks    Status Revised    Target Date 11/25/20      PT LONG TERM GOAL #3   Title Pt will increase speed from 0.83m/s (0.13ft/sec) to >0.26m/s for improved mobility with bilateral AFOs and walker.    Time 8    Period Weeks    Status On-going    Target Date 11/25/20                   Plan - 11/08/20 0959     Clinical Impression Statement Pt is likely not going to pursue L AFO at this time due to improvement in  gait.  Pt continues to demonstrate decreased genu recurvatum and toe catching when ambulating with RW and with lofstrand crutches but continues to  demonstrate instability with turns.  Treatment session focused on trial of heel wedge in L shoe to increase tibial translation forwards with gait with lofstrands and stance phase hip rotation internal and external to improve stance phase stability with turns.  Pt demonstrated increased fatigue after rotation training.  Due to patient deciding to not pursue the L AFO, PT will increase frequency of PT sessions to 2x/week for further gait and balance training to ensure safety with gait with lofstrands and possibly heel wedge prior to D/C from therapy.  Addendum to POC sent to physician today.    Personal Factors and Comorbidities Comorbidity 3+;Time since onset of injury/illness/exacerbation;Past/Current Experience    Comorbidities multiple falls, vitamin D insufficiency, Type 1 IDDM, diabetic retinopathy, hypothyroidism, HTN, hyperlipidemia, R tibia fracture, L ankle fracture with ORIF    Examination-Activity Limitations Carry;Locomotion Level;Stairs;Stand;Transfers;Squat    Examination-Participation Restrictions Community Activity;Driving;Yard Work    [employer] Evolving/Moderate complexity    Rehab Potential Good    PT Frequency 2x / week    PT Duration 4 weeks    PT Treatment/Interventions ADLs/Self Care Home Management;Aquatic Therapy;Electrical Stimulation;DME Instruction;Gait training;Stair training;Functional mobility training;Therapeutic activities;Therapeutic exercise;Balance training;Neuromuscular re-education;Patient/family education;Orthotic Fit/Training;Passive range of motion;Energy conservation;Manual techniques    PT Next Visit Plan KX MODIFIER.  Pt is likely not going to get L AFO; I did trial a heel wedge in that shoe, how is it working at home?  Lofstrands outside when weather allows.  Working on stance phase rotation/turning/pivoting and translating to gait. half kneeling on floor with back knee elevated slightly on blue foam.  Gait with bilateral  loftstrand  crutches. Balance with rockerboard, weight shifting during gait and greater stance control.  Glute strengthening.    Consulted and Agree with Plan of Care Patient             Patient will benefit from skilled therapeutic intervention in order to improve the following deficits and impairments:  Abnormal gait, Decreased balance, Decreased endurance, Decreased coordination, Decreased range of motion, Decreased strength, Difficulty walking, Impaired tone, Postural dysfunction, Decreased activity tolerance, Decreased mobility, Impaired flexibility, Increased edema  Visit Diagnosis: Other abnormalities of gait and mobility  Muscle weakness (generalized)  Unsteadiness on feet  Repeated falls  Difficulty in walking, not elsewhere classified  Foot drop, right  Muscle weakness     Problem List Patient Active Problem List   Diagnosis Date Noted   OAB (overactive bladder) 09/21/2020   Conjunctival injection, right 09/21/2020   Essential hypertension 09/21/2020   Urinary dysfunction 09/21/2020   Thoracic outlet syndrome 05/03/2020   Controlled type 1 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 ([place]) 03/24/2020   Right epiretinal membrane 03/24/2020   Retinal exudates and deposits 03/24/2020   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)    Hypothyroidism    Hypertension    Abnormal liver function tests 03/06/2013   Type I (juvenile type)  diabetes mellitus without mention of complication, uncontrolled 12/03/2007   HYPERCHOLESTEROLEMIA 11/12/2007   MULTIPLE SCLEROSIS 08/29/2007   PROLIFERATIVE DIABETIC RETINOPATHY 08/29/2007    [name], PT, DPT 11/08/20    12:12 PM    [facility] [address] [place], [place], [address] Phone: [phone]   Fax:  [phone]  Name: Johnathan Ross MRN: [id number] Date of Birth: Jan 01, 1971

## 2020-11-09 ENCOUNTER — Ambulatory Visit: Payer: Medicare Other | Admitting: Physical Therapy

## 2020-11-09 ENCOUNTER — Encounter: Payer: Self-pay | Admitting: Physical Therapy

## 2020-11-09 DIAGNOSIS — R262 Difficulty in walking, not elsewhere classified: Secondary | ICD-10-CM | POA: Diagnosis not present

## 2020-11-09 DIAGNOSIS — M6281 Muscle weakness (generalized): Secondary | ICD-10-CM

## 2020-11-09 DIAGNOSIS — M21371 Foot drop, right foot: Secondary | ICD-10-CM | POA: Diagnosis not present

## 2020-11-09 DIAGNOSIS — R296 Repeated falls: Secondary | ICD-10-CM | POA: Diagnosis not present

## 2020-11-09 DIAGNOSIS — R2681 Unsteadiness on feet: Secondary | ICD-10-CM

## 2020-11-09 DIAGNOSIS — R2689 Other abnormalities of gait and mobility: Secondary | ICD-10-CM

## 2020-11-10 NOTE — Therapy (Signed)
[facility] [address] [place], [place], [address] Phone: [phone]   Fax:  [phone]  Physical Therapy Treatment  Patient Details  Name: Johnathan Ross MRN: [id number] Date of Birth: September 03, 1970 Referring Provider (PT): Britt Bottom, MD   Encounter Date: 11/09/2020    11/09/20 0851  PT Visits / Re-Eval  Visit Number 24  Number of Visits 30  Date for PT Re-Evaluation 12/01/20 (60 day POC, 90 day cert)  Authorization  Authorization Type Medicare Part A and B; 10th visit PN - KX MODIFIER NEEDED NOW  Progress Note Due on Visit 30  PT Time Calculation  PT Start Time 0300  PT Stop Time 0930  PT Time Calculation (min) 43 min  PT - End of Session  Equipment Utilized During Treatment Other (comment) (pt's right AFO)  Activity Tolerance Patient tolerated treatment well  Behavior During Therapy [facility] for tasks assessed/performed    Past Medical History:  Diagnosis Date   Hyperlipidemia    Hypertension    Hypogonadism in male 11/01/2016   Hypothyroidism    Multiple sclerosis ([place])    Proliferative diabetic retinopathy(362.02)    Type 1 diabetes mellitus ([place]) dx'd 1994   Vitamin D insufficiency 11/29/2016    Past Surgical History:  Procedure Laterality Date   EYE SURGERY Bilateral    "laser for diabetic retinopathy"   FRACTURE SURGERY     IR VENO/EXT/UNI LEFT  03/24/2020   OPEN REDUCTION INTERNAL FIXATION (ORIF) TIBIA/FIBULA FRACTURE Right 10/30/2016   ORIF ANKLE FRACTURE Left 2015   ORIF TIBIA FRACTURE Right 10/30/2016   Procedure: OPEN REDUCTION INTERNAL FIXATION (ORIF) TIBIA FIBULA FRACTURE;  Surgeon: Altamese Alba, MD;  Location: [facility];  Service: Orthopedics;  Laterality: Right;   RETINAL LASER PROCEDURE Bilateral    "for diabetic retinopathy"    There were no vitals filed for this visit.     11/09/20 0850  Symptoms/Limitations  Subjective No new complaints. No falls or pain to report. Has  heel wedge in today, has not noticed it in the shoe (states it's more comfortable than the brace). Has not noticed if it helps with walking or not.  Pertinent History multiple falls, vitamin D insufficiency, Type 1 IDDM, diabetic retinopathy, hypothyroidism, HTN, hyperlipidemia, R tibia fracture, L ankle fracture with ORIF  Patient Stated Goals Wants to get back to working on his walking and strength.  Pain Assessment  Currently in Pain? No/denies  Pain Score 0      11/09/20 0852  Transfers  Transfers Sit to Stand;Stand to Sit  Sit to Stand 6: Modified independent (Device/Increase time)  Stand to Sit 6: Modified independent (Device/Increase time)  Ambulation/Gait  Ambulation/Gait Yes  Ambulation/Gait Assistance 6: Modified independent (Device/Increase time);4: Min guard  Ambulation/Gait Assistance Details use of RW to enter/exit session. use of bil loftstrand crutches in session with several stops needed to "reset" due to extensor tone in LE's. no significant balance issues noted.  Ambulation Distance (Feet) 230 Feet (x1 with crutches)  Assistive device Rolling walker;Lofstrands  Gait Pattern Step-through pattern;Decreased dorsiflexion - right;Decreased dorsiflexion - left;Right genu recurvatum;Poor foot clearance - left;Poor foot clearance - right  Ambulation Surface Level;Indoor  Exercises  Exercises Other Exercises  Other Exercises  prone on mat table: passive quad stretch for 30 sec's x 3 rep's each side before ex's performed. HS curls  10 reps each side with up to min assist needed for control as pt fatigued; then glut kick backs for 10 reps each side with  assist to maintain knee position, right>left side.       PT Short Term Goals - 11/01/20 1932       PT SHORT TERM GOAL #1   Title Pt will ambulate up/down 6 stairs with railing mod I to safely enter home.    Baseline 11/01/20 8 steps with railing mod I    Time 4    Period Weeks    Status Achieved    Target Date 11/01/20       PT SHORT TERM GOAL #2   Title Pt will be instructed on proper wear time when new AFO is received.    Baseline Pt has not yet pursued left AFO further. Has been given order from MD    Time 4    Period Weeks    Status Deferred    Target Date 11/01/20               PT Long Term Goals - 10/05/20 1955       PT LONG TERM GOAL #1   Title Pt will ambulate >300' with bilateral loftstrand crutches for improved mobility on level paved or grass surfaces to be able to get to his buidling out back safely CGA. ( LTGs due 11/25/20)    Baseline 09/29/20: min guard to min assist currently    Time 8    Period Weeks    Status On-going    Target Date 11/25/20      PT LONG TERM GOAL #2   Title Pt will increase Berg from 36/56 to >38/56 for improved balance.    Time 8    Period Weeks    Status Revised    Target Date 11/25/20      PT LONG TERM GOAL #3   Title Pt will increase speed from 0.71m/s (0.14ft/sec) to >0.55m/s for improved mobility with bilateral AFOs and walker.    Time 8    Period Weeks    Status On-going    Target Date 11/25/20               11/09/20 0852  Plan  Clinical Impression Statement Today's skilled session continued to focus on gait with forearm crutches and LE stretching/strengthening to address spasticity and muscle weakness. No issues noted or reported in session. The pt is making steady progress toward goals and should benefit from continued PT to progress toward unmet goals.  Personal Factors and Comorbidities Comorbidity 3+;Time since onset of injury/illness/exacerbation;Past/Current Experience  Comorbidities multiple falls, vitamin D insufficiency, Type 1 IDDM, diabetic retinopathy, hypothyroidism, HTN, hyperlipidemia, R tibia fracture, L ankle fracture with ORIF  Examination-Activity Limitations Carry;Locomotion Level;Stairs;Stand;Transfers;Squat  Examination-Participation Restrictions Community Activity;Driving;Yard Work  Pt will benefit from skilled  therapeutic intervention in order to improve on the following deficits Abnormal gait;Decreased balance;Decreased endurance;Decreased coordination;Decreased range of motion;Decreased strength;Difficulty walking;Impaired tone;Postural dysfunction;Decreased activity tolerance;Decreased mobility;Impaired flexibility;Increased edema  Stability/Clinical Decision Making Evolving/Moderate complexity  Rehab Potential Good  PT Frequency 2x / week  PT Duration 4 weeks  PT Treatment/Interventions ADLs/Self Care Home Management;Aquatic Therapy;Electrical Stimulation;DME Instruction;Gait training;Stair training;Functional mobility training;Therapeutic activities;Therapeutic exercise;Balance training;Neuromuscular re-education;Patient/family education;Orthotic Fit/Training;Passive range of motion;Energy conservation;Manual techniques  PT Next Visit Plan KX MODIFIER.  Pt is likely not going to get L AFO; I did trial a heel wedge in that shoe, how is it working at home?  Lofstrands outside when weather allows.  Working on stance phase rotation/turning/pivoting and translating to gait. half kneeling on floor with back knee elevated slightly on blue foam.  Gait with bilateral  loftstrand crutches. Balance with rockerboard, weight shifting during gait and greater stance control.  Glute strengthening.  Consulted and Agree with Plan of Care Patient         Patient will benefit from skilled therapeutic intervention in order to improve the following deficits and impairments:  Abnormal gait, Decreased balance, Decreased endurance, Decreased coordination, Decreased range of motion, Decreased strength, Difficulty walking, Impaired tone, Postural dysfunction, Decreased activity tolerance, Decreased mobility, Impaired flexibility, Increased edema  Visit Diagnosis: Other abnormalities of gait and mobility  Muscle weakness (generalized)  Unsteadiness on feet     Problem List Patient Active Problem List   Diagnosis  Date Noted   OAB (overactive bladder) 09/21/2020   Conjunctival injection, right 09/21/2020   Essential hypertension 09/21/2020   Urinary dysfunction 09/21/2020   Thoracic outlet syndrome 05/03/2020   Controlled type 1 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 ([place]) 03/24/2020   Right epiretinal membrane 03/24/2020   Retinal exudates and deposits 03/24/2020   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   Nuclear sclerotic cataract of both eyes 03/24/2020   Gait disorder 03/23/2020   Benign prostatic hyperplasia without lower urinary tract symptoms 03/01/2020   Diabetic gastroparesis associated with type 1 diabetes mellitus ([place]) 03/01/2020   Uncontrolled type 1 diabetes mellitus with hyperglycemia ([place]) 03/01/2020   Gait disturbance 08/19/2019   Acquired diplegia ([place]) 08/19/2019   Vitamin D insufficiency 11/29/2016   Hypogonadism in male 11/01/2016   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[place])    Hypothyroidism    Hypertension    Abnormal liver function tests 03/06/2013   Type I (juvenile type) diabetes mellitus without mention of complication, uncontrolled 12/03/2007   HYPERCHOLESTEROLEMIA 11/12/2007   MULTIPLE SCLEROSIS 08/29/2007   PROLIFERATIVE DIABETIC RETINOPATHY 08/29/2007     Willow Ora, PTA, [facility] [address], [place], [place] [address] [phone] 11/10/20, 7:52 PM   Name: Johnathan Ross MRN: [id number] Date of Birth: 1971/03/25

## 2020-11-15 ENCOUNTER — Ambulatory Visit: Payer: Medicare Other | Admitting: Physical Therapy

## 2020-11-15 ENCOUNTER — Other Ambulatory Visit: Payer: Self-pay

## 2020-11-15 DIAGNOSIS — M6281 Muscle weakness (generalized): Secondary | ICD-10-CM

## 2020-11-15 DIAGNOSIS — M21371 Foot drop, right foot: Secondary | ICD-10-CM | POA: Diagnosis not present

## 2020-11-15 DIAGNOSIS — R262 Difficulty in walking, not elsewhere classified: Secondary | ICD-10-CM | POA: Diagnosis not present

## 2020-11-15 DIAGNOSIS — R296 Repeated falls: Secondary | ICD-10-CM

## 2020-11-15 DIAGNOSIS — R2681 Unsteadiness on feet: Secondary | ICD-10-CM

## 2020-11-15 DIAGNOSIS — R2689 Other abnormalities of gait and mobility: Secondary | ICD-10-CM

## 2020-11-15 NOTE — Therapy (Signed)
[facility] [address] [place], [place], [address] Phone: [phone]   Fax:  [phone]  Physical Therapy Treatment  Patient Details  Name: Johnathan Ross MRN: [id number] Date of Birth: 1970/07/10 Referring Provider (PT): Felecia Shelling, Nanine Means, MD   Encounter Date: 11/15/2020   PT End of Session - 11/15/20 1027     Visit Number 25    Number of Visits 30    Date for PT Re-Evaluation 12/01/20   60 day POC, 90 day cert   Authorization Type Medicare Part A and B; 10th visit PN - KX MODIFIER NEEDED NOW    Progress Note Due on Visit 27    PT Start Time 0850    PT Stop Time 0935    PT Time Calculation (min) 45 min    Activity Tolerance Patient tolerated treatment well    Behavior During Therapy [facility] for tasks assessed/performed             Past Medical History:  Diagnosis Date   Hyperlipidemia    Hypertension    Hypogonadism in male 11/01/2016   Hypothyroidism    Multiple sclerosis ([place])    Proliferative diabetic retinopathy(362.02)    Type 1 diabetes mellitus ([place]) dx'd 1994   Vitamin D insufficiency 11/29/2016    Past Surgical History:  Procedure Laterality Date   EYE SURGERY Bilateral    "laser for diabetic retinopathy"   FRACTURE SURGERY     IR VENO/EXT/UNI LEFT  03/24/2020   OPEN REDUCTION INTERNAL FIXATION (ORIF) TIBIA/FIBULA FRACTURE Right 10/30/2016   ORIF ANKLE FRACTURE Left 2015   ORIF TIBIA FRACTURE Right 10/30/2016   Procedure: OPEN REDUCTION INTERNAL FIXATION (ORIF) TIBIA FIBULA FRACTURE;  Surgeon: Altamese Buxton, MD;  Location: [facility];  Service: Orthopedics;  Laterality: Right;   RETINAL LASER PROCEDURE Bilateral    "for diabetic retinopathy"    There were no vitals filed for this visit.   Subjective Assessment - 11/15/20 0857     Subjective Tried hamstring lifts at home but tone kicked in and LE straightened out.  Not able to get foot in shoe with heel wedge today; took heel wedge out.   Noticing increased core strength, decreased UE use when transferring OOB.    Pertinent History multiple falls, vitamin D insufficiency, Type 1 IDDM, diabetic retinopathy, hypothyroidism, HTN, hyperlipidemia, R tibia fracture, L ankle fracture with ORIF    Patient Stated Goals Wants to get back to working on his walking and strength.                               [facility] PT Treatment/Exercise - 11/15/20 0909       Exercises   Exercises Other Exercises    Other Exercises  Prone on floor with feet supported at 90 deg resting on mat to simulate performing hamstring extension with couch supporting foot to prevent knee extension.  Performed 2 sets x 10 reps each LE.  Transitioned to Tall kneeling with 5lb weights performed alternating overhead press, arm swing backwards/tricep extension, shoulder ABD to 90, and backward row, 2 sets x 20 reps alternating.  Ended with R and L seated hamstring and gastroc stretch with belt, 2 sets x 30 seconds.                    PT Education - 11/15/20 1031     Education Details how to perform hamstring lift at home with  feet supported on edge of couch to maintain knee flexion at 90 deg    Person(s) Educated Patient    Methods Explanation;Demonstration    Comprehension Verbalized understanding;Returned demonstration              PT Short Term Goals - 11/01/20 1932       PT SHORT TERM GOAL #1   Title Pt will ambulate up/down 6 stairs with railing mod I to safely enter home.    Baseline 11/01/20 8 steps with railing mod I    Time 4    Period Weeks    Status Achieved    Target Date 11/01/20      PT SHORT TERM GOAL #2   Title Pt will be instructed on proper wear time when new AFO is received.    Baseline Pt has not yet pursued left AFO further. Has been given order from MD    Time 4    Period Weeks    Status Deferred    Target Date 11/01/20               PT Long Term Goals - 10/05/20 1955       PT LONG TERM  GOAL #1   Title Pt will ambulate >300' with bilateral loftstrand crutches for improved mobility on level paved or grass surfaces to be able to get to his buidling out back safely CGA. ( LTGs due 11/25/20)    Baseline 09/29/20: min guard to min assist currently    Time 8    Period Weeks    Status On-going    Target Date 11/25/20      PT LONG TERM GOAL #2   Title Pt will increase Berg from 36/56 to >38/56 for improved balance.    Time 8    Period Weeks    Status Revised    Target Date 11/25/20      PT LONG TERM GOAL #3   Title Pt will increase speed from 0.63m/s (0.32ft/sec) to >0.102m/s for improved mobility with bilateral AFOs and walker.    Time 8    Period Weeks    Status On-going    Target Date 11/25/20                   Plan - 11/15/20 1028     Clinical Impression Statement Continued to focus on hamstring strengthening and training - revised home exercise hamstring lifts using couch to maintain 90 deg knee flexion and prevent extensor tone.  Progressed core and balance training performing UE exercises with 5lb weight in tall kneeling for increased glute med and hamstring activation.  Pt tolerated well and will incorporate in exercises at home.    Personal Factors and Comorbidities Comorbidity 3+;Time since onset of injury/illness/exacerbation;Past/Current Experience    Comorbidities multiple falls, vitamin D insufficiency, Type 1 IDDM, diabetic retinopathy, hypothyroidism, HTN, hyperlipidemia, R tibia fracture, L ankle fracture with ORIF    Examination-Activity Limitations Carry;Locomotion Level;Stairs;Stand;Transfers;Squat    Examination-Participation Restrictions Community Activity;Driving;Yard Work    [employer] Evolving/Moderate complexity    Rehab Potential Good    PT Frequency 2x / week    PT Duration 4 weeks    PT Treatment/Interventions ADLs/Self Care Home Management;Aquatic Therapy;Electrical Stimulation;DME Instruction;Gait training;Stair  training;Functional mobility training;Therapeutic activities;Therapeutic exercise;Balance training;Neuromuscular re-education;Patient/family education;Orthotic Fit/Training;Passive range of motion;Energy conservation;Manual techniques    PT Next Visit Plan KX MODIFIER.  Pt is likely not going to get L AFO; I did trial a heel wedge in that shoe,  how is it working at home?  Tall kneeling with one knee forward (staggered tall kneeling) performing UE exercises for hamstring, hip and core training.  Lofstrands outside when weather allows.  Working on stance phase rotation/turning/pivoting and translating to gait. half kneeling on floor with back knee elevated slightly on blue foam.  Gait with bilateral  loftstrand crutches. Balance with rockerboard, weight shifting during gait and greater stance control.  Glute strengthening.    Consulted and Agree with Plan of Care Patient             Patient will benefit from skilled therapeutic intervention in order to improve the following deficits and impairments:  Abnormal gait, Decreased balance, Decreased endurance, Decreased coordination, Decreased range of motion, Decreased strength, Difficulty walking, Impaired tone, Postural dysfunction, Decreased activity tolerance, Decreased mobility, Impaired flexibility, Increased edema  Visit Diagnosis: Other abnormalities of gait and mobility  Muscle weakness (generalized)  Unsteadiness on feet  Repeated falls  Difficulty in walking, not elsewhere classified  Foot drop, right  Muscle weakness     Problem List Patient Active Problem List   Diagnosis Date Noted   OAB (overactive bladder) 09/21/2020   Conjunctival injection, right 09/21/2020   Essential hypertension 09/21/2020   Urinary dysfunction 09/21/2020   Thoracic outlet syndrome 05/03/2020   Controlled type 1 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 ([place]) 03/24/2020   Right epiretinal membrane 03/24/2020   Retinal  exudates and deposits 03/24/2020   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   Nuclear sclerotic cataract of both eyes 03/24/2020   Gait disorder 03/23/2020   Benign prostatic hyperplasia without lower urinary tract symptoms 03/01/2020   Diabetic gastroparesis associated with type 1 diabetes mellitus ([place]) 03/01/2020   Uncontrolled type 1 diabetes mellitus with hyperglycemia ([place]) 03/01/2020   Gait disturbance 08/19/2019   Acquired diplegia ([place]) 08/19/2019   Vitamin D insufficiency 11/29/2016   Hypogonadism in male 11/01/2016   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[place])    Hypothyroidism    Hypertension    Abnormal liver function tests 03/06/2013   Type I (juvenile type) diabetes mellitus without mention of complication, uncontrolled 12/03/2007   HYPERCHOLESTEROLEMIA 11/12/2007   MULTIPLE SCLEROSIS 08/29/2007   PROLIFERATIVE DIABETIC RETINOPATHY 08/29/2007    [name], PT, DPT 11/15/20    10:32 AM    [facility] [address] [place], [place], [address] Phone: [phone]   Fax:  [phone]  Name: Johnathan Ross MRN: [id number] Date of Birth: [birth year]

## 2020-11-18 ENCOUNTER — Other Ambulatory Visit: Payer: Self-pay

## 2020-11-18 ENCOUNTER — Encounter: Payer: Self-pay | Admitting: Physical Therapy

## 2020-11-18 ENCOUNTER — Ambulatory Visit: Payer: Medicare Other | Admitting: Physical Therapy

## 2020-11-18 DIAGNOSIS — R296 Repeated falls: Secondary | ICD-10-CM | POA: Diagnosis not present

## 2020-11-18 DIAGNOSIS — M6281 Muscle weakness (generalized): Secondary | ICD-10-CM | POA: Diagnosis not present

## 2020-11-18 DIAGNOSIS — R2681 Unsteadiness on feet: Secondary | ICD-10-CM

## 2020-11-18 DIAGNOSIS — M21371 Foot drop, right foot: Secondary | ICD-10-CM | POA: Diagnosis not present

## 2020-11-18 DIAGNOSIS — R2689 Other abnormalities of gait and mobility: Secondary | ICD-10-CM

## 2020-11-18 DIAGNOSIS — R262 Difficulty in walking, not elsewhere classified: Secondary | ICD-10-CM | POA: Diagnosis not present

## 2020-11-21 NOTE — Therapy (Signed)
[facility] [address] [place], [place], [address] Phone: [phone]   Fax:  [phone]  Physical Therapy Treatment  Patient Details  Name: Johnathan Ross MRN: [id number] Date of Birth: November 19, 1970 Referring Provider (PT): Britt Bottom, MD   Encounter Date: 11/18/2020    11/18/20 0854  PT Visits / Re-Eval  Visit Number 26  Number of Visits 30  Date for PT Re-Evaluation 12/01/20 (60 day POC, 90 day cert)  Authorization  Authorization Type Medicare Part A and B; 10th visit PN - KX MODIFIER NEEDED NOW  Progress Note Due on Visit 30  PT Time Calculation  PT Start Time 0848  PT Stop Time 0928  PT Time Calculation (min) 40 min  PT - End of Session  Activity Tolerance Patient tolerated treatment well  Behavior During Therapy [facility] for tasks assessed/performed    Past Medical History:  Diagnosis Date   Hyperlipidemia    Hypertension    Hypogonadism in male 11/01/2016   Hypothyroidism    Multiple sclerosis ([place])    Proliferative diabetic retinopathy(362.02)    Type 1 diabetes mellitus ([place]) dx'd 1994   Vitamin D insufficiency 11/29/2016    Past Surgical History:  Procedure Laterality Date   EYE SURGERY Bilateral    "laser for diabetic retinopathy"   FRACTURE SURGERY     IR VENO/EXT/UNI LEFT  03/24/2020   OPEN REDUCTION INTERNAL FIXATION (ORIF) TIBIA/FIBULA FRACTURE Right 10/30/2016   ORIF ANKLE FRACTURE Left 2015   ORIF TIBIA FRACTURE Right 10/30/2016   Procedure: OPEN REDUCTION INTERNAL FIXATION (ORIF) TIBIA FIBULA FRACTURE;  Surgeon: Altamese [place], MD;  Location: [facility];  Service: Orthopedics;  Laterality: Right;   RETINAL LASER PROCEDURE Bilateral    "for diabetic retinopathy"    There were no vitals filed for this visit.    11/18/20 0850  Symptoms/Limitations  Subjective No new complaints. No falls to report. Now starting to have pain in right arm, no change in pain in left arm. Not in the same  spot as on the left arm where he can pin point the pain, in the right arm it's more generalized. Advised to start with PCP to follow up causes of arm pain.  Pertinent History multiple falls, vitamin D insufficiency, Type 1 IDDM, diabetic retinopathy, hypothyroidism, HTN, hyperlipidemia, R tibia fracture, L ankle fracture with ORIF  Patient Stated Goals Wants to get back to working on his walking and strength.  Pain Assessment  Currently in Pain? Yes  Pain Score 7  Pain Location Arm  Pain Orientation Left  Pain Descriptors / Indicators Aching;Tightness  Pain Type Acute pain;Chronic pain  Pain Onset More than a month ago  Pain Frequency Intermittent  Aggravating Factors  movemen of left elbow  Pain Relieving Factors unsure      11/18/20 0854  Transfers  Transfers Sit to Stand;Stand to Sit  Ambulation/Gait  Ambulation/Gait Yes  Ambulation/Gait Assistance 6: Modified independent (Device/Increase time);5: Supervision;4: Min guard  Ambulation/Gait Assistance Details use of RW to enter/exit session. use of bil forearm crutches in session with several stops needed with gait to "reset" due to tone, min guard assist on outdoor surfaces.  Ambulation Distance (Feet) 350 Feet (x 1 in/outdoors with forearm crutches)  Assistive device Rolling walker;Lofstrands  Gait Pattern Step-through pattern;Decreased dorsiflexion - right;Decreased dorsiflexion - left;Right genu recurvatum;Poor foot clearance - left;Poor foot clearance - right  Ambulation Surface Level;Unlevel;Indoor;Outdoor;Paved  Curb 4: Min assist;3: Mod assist  Curb Details (indicate cue type and reason) outdoor  curb with forearm cruthes- min assist to descend, mod assist to ascend with left extensor tone preventing this on 1st attempt, able to get left foot up on 2cd attempt, leading with right foot both attempts.  Neuro Re-ed   Neuro Re-ed Details  for strengthening/NMR: tall kneeling on red mat table with green band resistance for mini  squats, then lateral mini squats for 10 reps each with UE support needed at times on mat table, cues for full return to tall kneeling each time.            PT Short Term Goals - 11/01/20 1932       PT SHORT TERM GOAL #1   Title Pt will ambulate up/down 6 stairs with railing mod I to safely enter home.    Baseline 11/01/20 8 steps with railing mod I    Time 4    Period Weeks    Status Achieved    Target Date 11/01/20      PT SHORT TERM GOAL #2   Title Pt will be instructed on proper wear time when new AFO is received.    Baseline Pt has not yet pursued left AFO further. Has been given order from MD    Time 4    Period Weeks    Status Deferred    Target Date 11/01/20               PT Long Term Goals - 10/05/20 1955       PT LONG TERM GOAL #1   Title Pt will ambulate >300' with bilateral loftstrand crutches for improved mobility on level paved or grass surfaces to be able to get to his buidling out back safely CGA. ( LTGs due 11/25/20)    Baseline 09/29/20: min guard to min assist currently    Time 8    Period Weeks    Status On-going    Target Date 11/25/20      PT LONG TERM GOAL #2   Title Pt will increase Berg from 36/56 to >38/56 for improved balance.    Time 8    Period Weeks    Status Revised    Target Date 11/25/20      PT LONG TERM GOAL #3   Title Pt will increase speed from 0.42m/s (0.62ft/sec) to >0.86m/s for improved mobility with bilateral AFOs and walker.    Time 8    Period Weeks    Status On-going    Target Date 11/25/20              11/18/20 0854  Plan  Clinical Impression Statement Today's skilled session continued to focus on gait with crutches and LE/core strengthening with no issues noted or reported. Did have pt perform curb with crutches for 1st time with increased assistance needed for safe ascending. Would benefit from additional practice on this. The pt is making progress and should benefit from continued PT to progress toward unmet  goals.  Personal Factors and Comorbidities Comorbidity 3+;Time since onset of injury/illness/exacerbation;Past/Current Experience  Comorbidities multiple falls, vitamin D insufficiency, Type 1 IDDM, diabetic retinopathy, hypothyroidism, HTN, hyperlipidemia, R tibia fracture, L ankle fracture with ORIF  Examination-Activity Limitations Carry;Locomotion Level;Stairs;Stand;Transfers;Squat  Examination-Participation Restrictions Community Activity;Driving;Yard Work  Pt will benefit from skilled therapeutic intervention in order to improve on the following deficits Abnormal gait;Decreased balance;Decreased endurance;Decreased coordination;Decreased range of motion;Decreased strength;Difficulty walking;Impaired tone;Postural dysfunction;Decreased activity tolerance;Decreased mobility;Impaired flexibility;Increased edema  Stability/Clinical Decision Making Evolving/Moderate complexity  Rehab Potential Good  PT Frequency 2x /  week  PT Duration 4 weeks  PT Treatment/Interventions ADLs/Self Care Home Management;Aquatic Therapy;Electrical Stimulation;DME Instruction;Gait training;Stair training;Functional mobility training;Therapeutic activities;Therapeutic exercise;Balance training;Neuromuscular re-education;Patient/family education;Orthotic Fit/Training;Passive range of motion;Energy conservation;Manual techniques  PT Next Visit Plan KX MODIFIER.  Pt is likely not going to get L AFO; I did trial a heel wedge in that shoe, how is it working at home?  Tall kneeling with one knee forward (staggered tall kneeling) performing UE exercises for hamstring, hip and core training.  Lofstrands outside when weather allows.  Working on stance phase rotation/turning/pivoting and translating to gait. half kneeling on floor with back knee elevated slightly on blue foam.  Gait with bilateral  loftstrand crutches. Balance with rockerboard, weight shifting during gait and greater stance control.  Glute strengthening.  Consulted and  Agree with Plan of Care Patient          Patient will benefit from skilled therapeutic intervention in order to improve the following deficits and impairments:  Abnormal gait, Decreased balance, Decreased endurance, Decreased coordination, Decreased range of motion, Decreased strength, Difficulty walking, Impaired tone, Postural dysfunction, Decreased activity tolerance, Decreased mobility, Impaired flexibility, Increased edema  Visit Diagnosis: Other abnormalities of gait and mobility  Muscle weakness (generalized)  Unsteadiness on feet     Problem List Patient Active Problem List   Diagnosis Date Noted   OAB (overactive bladder) 09/21/2020   Conjunctival injection, right 09/21/2020   Essential hypertension 09/21/2020   Urinary dysfunction 09/21/2020   Thoracic outlet syndrome 05/03/2020   Controlled type 1 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 ([place]) 03/24/2020   Right epiretinal membrane 03/24/2020   Retinal exudates and deposits 03/24/2020   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) 03/01/2020   Gait disturbance 08/19/2019   Acquired diplegia ([place]) 08/19/2019   Vitamin D insufficiency 11/29/2016   Hypogonadism in male 11/01/2016   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[place])    Hypothyroidism    Hypertension    Abnormal liver function tests 03/06/2013   Type I (juvenile type) diabetes mellitus without mention of complication, uncontrolled 12/03/2007   HYPERCHOLESTEROLEMIA 11/12/2007   MULTIPLE SCLEROSIS 08/29/2007   PROLIFERATIVE DIABETIC RETINOPATHY 08/29/2007    Willow Ora, PTA, [facility] [address] [place], [place] [address] [phone] 11/21/20, 12:48 PM   Name: SANTE BIEDERMANN MRN: [id number] Date of Birth: 12/15/1970

## 2020-11-22 ENCOUNTER — Other Ambulatory Visit: Payer: Self-pay

## 2020-11-22 ENCOUNTER — Encounter: Payer: Self-pay | Admitting: Physical Therapy

## 2020-11-22 ENCOUNTER — Ambulatory Visit: Payer: Medicare Other | Admitting: Physical Therapy

## 2020-11-22 DIAGNOSIS — R2681 Unsteadiness on feet: Secondary | ICD-10-CM | POA: Diagnosis not present

## 2020-11-22 DIAGNOSIS — R262 Difficulty in walking, not elsewhere classified: Secondary | ICD-10-CM | POA: Diagnosis not present

## 2020-11-22 DIAGNOSIS — R296 Repeated falls: Secondary | ICD-10-CM | POA: Diagnosis not present

## 2020-11-22 DIAGNOSIS — M6281 Muscle weakness (generalized): Secondary | ICD-10-CM

## 2020-11-22 DIAGNOSIS — R2689 Other abnormalities of gait and mobility: Secondary | ICD-10-CM

## 2020-11-22 DIAGNOSIS — M21371 Foot drop, right foot: Secondary | ICD-10-CM | POA: Diagnosis not present

## 2020-11-22 NOTE — Therapy (Signed)
[facility] [address] [place], [place], [address] Phone: [phone]   Fax:  [phone]  Physical Therapy Treatment  Patient Details  Name: Johnathan Ross MRN: [id number] Date of Birth: Jun 20, 1970 Referring Provider (PT): Felecia Shelling, Nanine Means, MD   Encounter Date: 11/22/2020   PT End of Session - 11/22/20 0854     Visit Number 27    Number of Visits 30    Date for PT Re-Evaluation 12/01/20   60 day POC, 90 day cert   Authorization Type Medicare Part A and B; 10th visit PN - KX MODIFIER NEEDED NOW    Progress Note Due on Visit 58    PT Start Time 0849    PT Stop Time 0930    PT Time Calculation (min) 41 min    Equipment Utilized During Treatment Gait belt    Activity Tolerance Patient tolerated treatment well    Behavior During Therapy [facility] for tasks assessed/performed             Past Medical History:  Diagnosis Date   Hyperlipidemia    Hypertension    Hypogonadism in male 11/01/2016   Hypothyroidism    Multiple sclerosis ([place])    Proliferative diabetic retinopathy(362.02)    Type 1 diabetes mellitus ([place]) dx'd 1994   Vitamin D insufficiency 11/29/2016    Past Surgical History:  Procedure Laterality Date   EYE SURGERY Bilateral    "laser for diabetic retinopathy"   FRACTURE SURGERY     IR VENO/EXT/UNI LEFT  03/24/2020   OPEN REDUCTION INTERNAL FIXATION (ORIF) TIBIA/FIBULA FRACTURE Right 10/30/2016   ORIF ANKLE FRACTURE Left 2015   ORIF TIBIA FRACTURE Right 10/30/2016   Procedure: OPEN REDUCTION INTERNAL FIXATION (ORIF) TIBIA FIBULA FRACTURE;  Surgeon: Altamese Waldron, MD;  Location: [facility];  Service: Orthopedics;  Laterality: Right;   RETINAL LASER PROCEDURE Bilateral    "for diabetic retinopathy"    There were no vitals filed for this visit.   Subjective Assessment - 11/22/20 0853     Subjective No new complaints. No falls to report.    Pertinent History multiple falls, vitamin D insufficiency,  Type 1 IDDM, diabetic retinopathy, hypothyroidism, HTN, hyperlipidemia, R tibia fracture, L ankle fracture with ORIF    Patient Stated Goals Wants to get back to working on his walking and strength.    Currently in Pain? Yes    Pain Score 7     Pain Location Arm    Pain Orientation Left    Pain Descriptors / Indicators Aching;Tightness    Pain Type Acute pain;Chronic pain    Pain Onset More than a month ago    Pain Frequency Intermittent    Aggravating Factors  movement of left elbow    Pain Relieving Factors unsure                [facility] PT Assessment - 11/22/20 0855       Berg Balance Test   Sit to Stand Able to stand  independently using hands   low mat   Standing Unsupported Able to stand safely 2 minutes    Sitting with Back Unsupported but Feet Supported on Floor or Stool Able to sit safely and securely 2 minutes    Stand to Sit Sits safely with minimal use of hands    Transfers Able to transfer safely, definite need of hands    Standing Unsupported with Eyes Closed Able to stand 10 seconds with supervision    Standing Unsupported  with Feet Together Able to place feet together independently and stand for 1 minute with supervision    From Standing, Reach Forward with Outstretched Arm Can reach confidently >25 cm (10")    From Standing Position, Pick up Object from [place] to pick up shoe safely and easily    From Standing Position, Turn to Look Behind Over each Shoulder Looks behind one side only/other side shows less weight shift   right>left   Turn 360 Degrees Needs assistance while turning   due to extensor tone in LE's   Standing Unsupported, Alternately Place Feet on Step/Stool Able to complete >2 steps/needs minimal assist   completed 5 reps before extensor tone in LE"s prevented more reps   Standing Unsupported, One Foot in Front Able to take small step independently and hold 30 seconds    Standing on One Leg Able to lift leg independently and hold equal to or more  than 3 seconds    Total Score 40    Berg comment: 40/56 significant risk for falls (>80%)                   [facility] Adult PT Treatment/Exercise - 11/22/20 0855       Transfers   Transfers Sit to Stand;Stand to Sit    Sit to Stand 6: Modified independent (Device/Increase time)    Stand to Sit 6: Modified independent (Device/Increase time)      Ambulation/Gait   Ambulation/Gait Yes    Ambulation/Gait Assistance 6: Modified independent (Device/Increase time);5: Supervision;4: Min guard    Ambulation/Gait Assistance Details Mod I with walker.  Mostly supervision with crutches. min guard at times for safety with LE extensor tone.    Ambulation Distance (Feet) 230 Feet   x1 with crutches, in/out of gym with RW   Assistive device Rolling walker;Lofstrands    Gait Pattern Step-through pattern;Decreased dorsiflexion - right;Decreased dorsiflexion - left;Right genu recurvatum;Poor foot clearance - left;Poor foot clearance - right    Ambulation Surface Level;Indoor                 PT Short Term Goals - 11/01/20 1932       PT SHORT TERM GOAL #1   Title Pt will ambulate up/down 6 stairs with railing mod I to safely enter home.    Baseline 11/01/20 8 steps with railing mod I    Time 4    Period Weeks    Status Achieved    Target Date 11/01/20      PT SHORT TERM GOAL #2   Title Pt will be instructed on proper wear time when new AFO is received.    Baseline Pt has not yet pursued left AFO further. Has been given order from MD    Time 4    Period Weeks    Status Deferred    Target Date 11/01/20               PT Long Term Goals - 11/22/20 0854       PT LONG TERM GOAL #1   Title Pt will ambulate >300' with bilateral loftstrand crutches for improved mobility on level paved or grass surfaces to be able to get to his buidling out back safely CGA. ( LTGs due 11/25/20)    Baseline 09/29/20: min guard to min assist currently    Time 8    Period Weeks    Status On-going       PT LONG TERM GOAL #2   Title Pt  will increase Berg from 36/56 to >38/56 for improved balance.    Baseline 11/22/20: 40/56 scored today    Time --    Period --    Status Achieved      PT LONG TERM GOAL #3   Title Pt will increase speed from 0.69ms (0.634fsec) to >0.7m51mfor improved mobility with bilateral AFOs and walker.    Time 8    Period Weeks    Status On-going                   Plan - 11/22/20 0854     Clinical Impression Statement Today's skilled session began to address progress toward LTGs for anticipated discharge at end of week. Pt met his Berg Balance Test goal with score of 40/56. Remainder of session continued to address gait with forearm crutches with min guard to supervision needed today due to extensor tone in LE's. Will plan to check remaining 2 goals at next session.    Personal Factors and Comorbidities Comorbidity 3+;Time since onset of injury/illness/exacerbation;Past/Current Experience    Comorbidities multiple falls, vitamin D insufficiency, Type 1 IDDM, diabetic retinopathy, hypothyroidism, HTN, hyperlipidemia, R tibia fracture, L ankle fracture with ORIF    Examination-Activity Limitations Carry;Locomotion Level;Stairs;Stand;Transfers;Squat    Examination-Participation Restrictions Community Activity;Driving;Yard Work    StaMerchant navy officerolving/Moderate complexity    Rehab Potential Good    PT Frequency 2x / week    PT Duration 4 weeks    PT Treatment/Interventions ADLs/Self Care Home Management;Aquatic Therapy;Electrical Stimulation;DME Instruction;Gait training;Stair training;Functional mobility training;Therapeutic activities;Therapeutic exercise;Balance training;Neuromuscular re-education;Patient/family education;Orthotic Fit/Training;Passive range of motion;Energy conservation;Manual techniques    PT Next Visit Plan KX MODIFIER.check remaining 2 goals for anticipated discharge.    Consulted and Agree with Plan of Care Patient              Patient will benefit from skilled therapeutic intervention in order to improve the following deficits and impairments:  Abnormal gait, Decreased balance, Decreased endurance, Decreased coordination, Decreased range of motion, Decreased strength, Difficulty walking, Impaired tone, Postural dysfunction, Decreased activity tolerance, Decreased mobility, Impaired flexibility, Increased edema  Visit Diagnosis: Other abnormalities of gait and mobility  Muscle weakness (generalized)  Unsteadiness on feet  Repeated falls  Difficulty in walking, not elsewhere classified     Problem List Patient Active Problem List   Diagnosis Date Noted   OAB (overactive bladder) 09/21/2020   Conjunctival injection, right 09/21/2020   Essential hypertension 09/21/2020   Urinary dysfunction 09/21/2020   Thoracic outlet syndrome 05/03/2020   Controlled type 1 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 ([place]0/28/2021   Right epiretinal membrane 03/24/2020   Retinal exudates and deposits 03/24/2020   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   Nuclear sclerotic cataract of both eyes 03/24/2020   Gait disorder 03/23/2020   Benign prostatic hyperplasia without lower urinary tract symptoms 03/01/2020   Diabetic gastroparesis associated with type 1 diabetes mellitus ([facility]0/09/2019   Uncontrolled type 1 diabetes mellitus with hyperglycemia ([facility]CC[facility]0/09/2019   Gait disturbance 08/19/2019   Acquired diplegia ([facility]3/24/2021   Vitamin D insufficiency 11/29/2016   Hypogonadism in male 11/01/2016   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[facility]  Hypothyroidism    Hypertension    Abnormal liver function tests 03/06/2013   Type I (juvenile type) diabetes mellitus without mention of complication, uncontrolled 12/03/2007   HYPERCHOLESTEROLEMIA 11/12/2007   MULTIPLE SCLEROSIS 08/29/2007   PROLIFERATIVE DIABETIC RETINOPATHY 08/29/2007    [facility],  [facility] [address] [place], [place] [address] [phone] 11/22/20, 4:02 PM   Name: Johnathan Ross MRN: [id number] Date of Birth: [birth year]

## 2020-11-24 ENCOUNTER — Encounter: Payer: Self-pay | Admitting: Physical Therapy

## 2020-11-24 ENCOUNTER — Ambulatory Visit: Payer: Medicare Other | Admitting: Physical Therapy

## 2020-11-24 ENCOUNTER — Other Ambulatory Visit: Payer: Self-pay

## 2020-11-24 DIAGNOSIS — M21372 Foot drop, left foot: Secondary | ICD-10-CM

## 2020-11-24 DIAGNOSIS — R2689 Other abnormalities of gait and mobility: Secondary | ICD-10-CM | POA: Diagnosis not present

## 2020-11-24 DIAGNOSIS — M6281 Muscle weakness (generalized): Secondary | ICD-10-CM | POA: Diagnosis not present

## 2020-11-24 DIAGNOSIS — M21371 Foot drop, right foot: Secondary | ICD-10-CM

## 2020-11-24 DIAGNOSIS — R2681 Unsteadiness on feet: Secondary | ICD-10-CM

## 2020-11-24 DIAGNOSIS — R262 Difficulty in walking, not elsewhere classified: Secondary | ICD-10-CM | POA: Diagnosis not present

## 2020-11-24 DIAGNOSIS — R296 Repeated falls: Secondary | ICD-10-CM

## 2020-11-24 NOTE — Therapy (Signed)
[facility] [address] [place], [place], [address] Phone: [phone]   Fax:  [phone]  Physical Therapy Treatment and D/C Summary  Patient Details  Name: Johnathan Ross MRN: [id number] Date of Birth: 25-Jun-1970 Referring Provider (PT): Britt Bottom, MD   Encounter Date: 11/24/2020   PT End of Session - 11/24/20 0855     Visit Number 28    Number of Visits 30    Date for PT Re-Evaluation 12/01/20   60 day POC, 90 day cert   Authorization Type Medicare Part A and B; 10th visit PN - KX MODIFIER NEEDED NOW    Progress Note Due on Visit 49    PT Start Time 0850    PT Stop Time 0936    PT Time Calculation (min) 46 min    Equipment Utilized During Treatment --    Activity Tolerance Patient tolerated treatment well    Behavior During Therapy [facility] for tasks assessed/performed             Past Medical History:  Diagnosis Date   Hyperlipidemia    Hypertension    Hypogonadism in male 11/01/2016   Hypothyroidism    Multiple sclerosis ([place])    Proliferative diabetic retinopathy(362.02)    Type 1 diabetes mellitus ([place]) dx'd 1994   Vitamin D insufficiency 11/29/2016    Past Surgical History:  Procedure Laterality Date   EYE SURGERY Bilateral    "laser for diabetic retinopathy"   FRACTURE SURGERY     IR VENO/EXT/UNI LEFT  03/24/2020   OPEN REDUCTION INTERNAL FIXATION (ORIF) TIBIA/FIBULA FRACTURE Right 10/30/2016   ORIF ANKLE FRACTURE Left 2015   ORIF TIBIA FRACTURE Right 10/30/2016   Procedure: OPEN REDUCTION INTERNAL FIXATION (ORIF) TIBIA FIBULA FRACTURE;  Surgeon: Altamese Goodyear, MD;  Location: [facility];  Service: Orthopedics;  Laterality: Right;   RETINAL LASER PROCEDURE Bilateral    "for diabetic retinopathy"    There were no vitals filed for this visit.   Subjective Assessment - 11/24/20 0856     Subjective Still having constant pain in LUE, intermittent pain in RUE.  No edema or redness.    Pertinent  History multiple falls, vitamin D insufficiency, Type 1 IDDM, diabetic retinopathy, hypothyroidism, HTN, hyperlipidemia, R tibia fracture, L ankle fracture with ORIF    Patient Stated Goals Wants to get back to working on his walking and strength.    Currently in Pain? Yes    Pain Onset More than a month ago                [facility] PT Assessment - 11/24/20 0901       Ambulation/Gait   Gait velocity 29.96 seconds - .33 m/sec      Standardized Balance Assessment   Standardized Balance Assessment 10 meter walk test    10 Meter Walk 29 seconds with bilat loftstrand crutches - 1.13 ft/sec            Reviewed the following exercises with patient to decrease tension in thoracic outlet - performed with half foam roll today but advised pt to perform with towel roll at home.  Access Code: [email or web address] URL: [email or web address] Date: 11/24/2020 Prepared by: Misty Stanley  Exercises Child's Pose Stretch - 1 x daily - 7 x weekly - 3 sets - 20 second hold - cued pt to perform active scapular depression prior to pushing hips back to decrease pressure in upper shoulder and to increase stretch in lats Supine Thoracic Mobilization Towel Roll  Vertical with Arm Stretch - 1 x daily - 5 x weekly - 1 sets - 2 minutes hold - Pt using towel roll at home Supine Lower Trunk Rotation - 1 x daily - 7 x weekly - 3 sets - 10 reps - While still on towel roll for thoracic mobilization Anterior Shoulder and Biceps Stretch - 1 x daily - 7 x weekly - 2 sets - 20-30 seconds hold - scooting forwards to edge of seat and lifting hips with UE extended Seated [place]ene Stretch with Towel - 1 x daily - 7 x weekly - 2 sets - 30 seconds hold - instead of using towel, hold edge of chair and lean away before performing head/neck movement for clavicle and 1st rip depression.    PT Education - 11/24/20 1300     Education Details thoracic, shoulder and neck stretches to decrease tension in thoracic outlet, D/C today  and will take a break from therapy at this time    Person(s) Educated Patient    Methods Explanation;Demonstration;Handout    Comprehension Verbalized understanding;Returned demonstration              PT Short Term Goals - 11/01/20 1932       PT SHORT TERM GOAL #1   Title Pt will ambulate up/down 6 stairs with railing mod I to safely enter home.    Baseline 11/01/20 8 steps with railing mod I    Time 4    Period Weeks    Status Achieved    Target Date 11/01/20      PT SHORT TERM GOAL #2   Title Pt will be instructed on proper wear time when new AFO is received.    Baseline Pt has not yet pursued left AFO further. Has been given order from MD    Time 4    Period Weeks    Status Deferred    Target Date 11/01/20               PT Long Term Goals - 11/24/20 0900       PT LONG TERM GOAL #1   Title Pt will ambulate >300' with bilateral loftstrand crutches for improved mobility on level paved or grass surfaces to be able to get to his buidling out back safely CGA. ( LTGs due 11/25/20)    Baseline Min guard with lofstrands    Time 8    Period Weeks    Status Achieved      PT LONG TERM GOAL #2   Title Pt will increase Berg from 36/56 to >38/56 for improved balance.    Baseline 11/22/20: 40/56 scored today    Status Achieved      PT LONG TERM GOAL #3   Title Pt will increase speed from 0.17ms (0.61fsec) to >0.51m15mfor improved mobility with bilateral AFOs and walker.    Baseline .351m40m with loftstrands    Time 8    Period Weeks    Status Achieved                   Plan - 11/24/20 1304     Clinical Impression Statement Completed assessment of LTG; pt has made good progress and has met all LTG with Loftstrand crutches which is what patient is primarily using at home when ambulating outside or in his workshop.  Pt demonstrates increased gait velocity but continues to require intermittent standing rest breaks when foot drags or experiences increased spasticity  in RLE.  Pt also demonstrates improved  core strength and standing balance but continues to be at increased risk for falls when standing without UE support.  Pt is not going to pursue an AFO for LLE at this time; pt is able to adjust gait sequence to prevent genu recurvatum and initiate hip and knee flexion to improve foot clearance.  Added thoracic, shoulder and neck stretches to HEP to decrease tension in thoracic outlet.  Pt return demonstrated all exercises.  Pt is ready for D/C at this time due to meeting all LTG.    Personal Factors and Comorbidities Comorbidity 3+;Time since onset of injury/illness/exacerbation;Past/Current Experience    Comorbidities multiple falls, vitamin D insufficiency, Type 1 IDDM, diabetic retinopathy, hypothyroidism, HTN, hyperlipidemia, R tibia fracture, L ankle fracture with ORIF    Examination-Activity Limitations Carry;Locomotion Level;Stairs;Stand;Transfers;Squat    Examination-Participation Restrictions Community Activity;Driving;Yard Work    Merchant navy officer Evolving/Moderate complexity    Rehab Potential Good    PT Frequency 2x / week    PT Duration 4 weeks    PT Treatment/Interventions ADLs/Self Care Home Management;Aquatic Therapy;Electrical Stimulation;DME Instruction;Gait training;Stair training;Functional mobility training;Therapeutic activities;Therapeutic exercise;Balance training;Neuromuscular re-education;Patient/family education;Orthotic Fit/Training;Passive range of motion;Energy conservation;Manual techniques    PT Next Visit Plan D/C    Consulted and Agree with Plan of Care Patient             Patient will benefit from skilled therapeutic intervention in order to improve the following deficits and impairments:  Abnormal gait, Decreased balance, Decreased endurance, Decreased coordination, Decreased range of motion, Decreased strength, Difficulty walking, Impaired tone, Postural dysfunction, Decreased activity tolerance,  Decreased mobility, Impaired flexibility, Increased edema  Visit Diagnosis: Other abnormalities of gait and mobility  Muscle weakness (generalized)  Unsteadiness on feet  Repeated falls  Difficulty in walking, not elsewhere classified  Foot drop, right  Muscle weakness  Foot drop, left     Problem List Patient Active Problem List   Diagnosis Date Noted   OAB (overactive bladder) 09/21/2020   Conjunctival injection, right 09/21/2020   Essential hypertension 09/21/2020   Urinary dysfunction 09/21/2020   Thoracic outlet syndrome 05/03/2020   Controlled type 1 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 ([place]) 03/24/2020   Right epiretinal membrane 03/24/2020   Retinal exudates and deposits 03/24/2020   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   Nuclear sclerotic cataract of both eyes 03/24/2020   Gait disorder 03/23/2020   Benign prostatic hyperplasia without lower urinary tract symptoms 03/01/2020   Diabetic gastroparesis associated with type 1 diabetes mellitus ([place]) 03/01/2020   Uncontrolled type 1 diabetes mellitus with hyperglycemia ([place]) 03/01/2020   Gait disturbance 08/19/2019   Acquired diplegia ([place]) 08/19/2019   Vitamin D insufficiency 11/29/2016   Hypogonadism in male 11/01/2016   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[place])    Hypothyroidism    Hypertension    Abnormal liver function tests 03/06/2013   Type I (juvenile type) diabetes mellitus without mention of complication, uncontrolled 12/03/2007   HYPERCHOLESTEROLEMIA 11/12/2007   MULTIPLE SCLEROSIS 08/29/2007   PROLIFERATIVE DIABETIC RETINOPATHY 08/29/2007   PHYSICAL THERAPY DISCHARGE SUMMARY  Visits from Start of Care: 28  Current functional level related to goals / functional outcomes: See LTG achievement and impression statement above   Remaining deficits: Impaired standing balance, impaired gait, LE weakness   Education / Equipment: HEP    Patient agrees to discharge. Patient goals were met. Patient is being discharged due to meeting the stated rehab goals.   [name], PT, DPT 11/24/20    1:16  PM    [facility] [address] [place], [place], [address] Phone: [phone]   Fax:  [phone]  Name: Johnathan Ross MRN: [id number] Date of Birth: [birth year]

## 2020-11-24 NOTE — Patient Instructions (Addendum)
Access Code: [email or web address] URL: [email or web address] Date: 11/24/2020 Prepared by: Misty Stanley  Exercises Child's Pose Stretch - 1 x daily - 7 x weekly - 3 sets - 20 second hold Supine Thoracic Mobilization Towel Roll Vertical with Arm Stretch - 1 x daily - 5 x weekly - 1 sets - 2 minutes hold Supine Lower Trunk Rotation - 1 x daily - 7 x weekly - 3 sets - 10 reps Anterior Shoulder and Biceps Stretch - 1 x daily - 7 x weekly - 2 sets - 20-30 seconds hold Seated Scalene Stretch with Towel - 1 x daily - 7 x weekly - 2 sets - 30 seconds hold

## 2020-12-03 ENCOUNTER — Other Ambulatory Visit: Payer: Self-pay | Admitting: Endocrinology

## 2020-12-03 DIAGNOSIS — E782 Mixed hyperlipidemia: Secondary | ICD-10-CM

## 2020-12-18 ENCOUNTER — Other Ambulatory Visit: Payer: Self-pay | Admitting: Internal Medicine

## 2020-12-18 DIAGNOSIS — N3281 Overactive bladder: Secondary | ICD-10-CM

## 2020-12-20 DIAGNOSIS — H18831 Recurrent erosion of cornea, right eye: Secondary | ICD-10-CM | POA: Diagnosis not present

## 2020-12-22 ENCOUNTER — Other Ambulatory Visit: Payer: Self-pay | Admitting: *Deleted

## 2020-12-22 DIAGNOSIS — R269 Unspecified abnormalities of gait and mobility: Secondary | ICD-10-CM

## 2020-12-22 DIAGNOSIS — G35 Multiple sclerosis: Secondary | ICD-10-CM

## 2020-12-22 MED ORDER — DALFAMPRIDINE ER 10 MG PO TB12: 10.0000 mg | ORAL_TABLET | Freq: Two times a day (BID) | ORAL | 3 refills | Status: DC

## 2020-12-23 DIAGNOSIS — H18831 Recurrent erosion of cornea, right eye: Secondary | ICD-10-CM | POA: Diagnosis not present

## 2020-12-26 ENCOUNTER — Encounter (INDEPENDENT_AMBULATORY_CARE_PROVIDER_SITE_OTHER): Payer: Medicare Other | Admitting: Ophthalmology

## 2020-12-28 ENCOUNTER — Telehealth: Payer: Self-pay | Admitting: Internal Medicine

## 2020-12-28 ENCOUNTER — Other Ambulatory Visit: Payer: Self-pay | Admitting: Internal Medicine

## 2020-12-28 DIAGNOSIS — U071 COVID-19: Secondary | ICD-10-CM

## 2020-12-28 MED ORDER — PAXLOVID 20 X 150 MG & 10 X 100MG PO TBPK: 3.0000 | ORAL_TABLET | Freq: Two times a day (BID) | ORAL | 0 refills | Status: AC

## 2020-12-28 NOTE — Telephone Encounter (Signed)
Patient is COVID+ 12/25/20 (at home test)   sore/scratchy throat, weak & fatigue  Sister would like to have patient prescribed Paxlovid  [facility]armacy: [id number] Phone:  [phone]:  [phone]

## 2020-12-28 NOTE — Progress Notes (Unsigned)
[email or web address]

## 2020-12-28 NOTE — Telephone Encounter (Signed)
Pt has been informed that Rx was sent and to hold Simvastatin for 10 days.

## 2021-01-03 DIAGNOSIS — H18831 Recurrent erosion of cornea, right eye: Secondary | ICD-10-CM | POA: Diagnosis not present

## 2021-01-05 ENCOUNTER — Other Ambulatory Visit: Payer: Self-pay | Admitting: Endocrinology

## 2021-01-26 ENCOUNTER — Telehealth (INDEPENDENT_AMBULATORY_CARE_PROVIDER_SITE_OTHER): Payer: Medicare Other | Admitting: Family Medicine

## 2021-01-26 ENCOUNTER — Telehealth: Payer: Self-pay | Admitting: Internal Medicine

## 2021-01-26 DIAGNOSIS — R0981 Nasal congestion: Secondary | ICD-10-CM

## 2021-01-26 DIAGNOSIS — R059 Cough, unspecified: Secondary | ICD-10-CM | POA: Diagnosis not present

## 2021-01-26 MED ORDER — AMOXICILLIN-POT CLAVULANATE 875-125 MG PO TABS: 1.0000 | ORAL_TABLET | Freq: Two times a day (BID) | ORAL | 0 refills | Status: DC

## 2021-01-26 NOTE — Telephone Encounter (Signed)
Called pt, LVM to call the office to schedule a VV per PCP.

## 2021-01-26 NOTE — Patient Instructions (Signed)
-  I sent the medication(s) we discussed to your pharmacy: Meds ordered this encounter  Medications   amoxicillin-clavulanate (AUGMENTIN) 875-125 MG tablet    Sig: Take 1 tablet by mouth 2 (two) times daily.    Dispense:  20 tablet    Refill:  0   Nasal saline twice daily  I hope you are feeling better soon!  Seek in person care promptly if your symptoms worsen, new concerns arise or you are not improving with treatment.  It was nice to meet you today. I help [facility] out with telemedicine visits on Tuesdays and Thursdays and am available for visits on those days. If you have any concerns or questions following this visit please schedule a follow up visit with your Primary Care doctor or seek care at a local urgent care clinic to avoid delays in care.

## 2021-01-26 NOTE — Telephone Encounter (Signed)
dry congestion.Marland Kitchenstuffy nose..had COVID a month ago & has not been able to get over these symptoms  Patient did not want to schedule a visit  Wants to know if provider can send something over to pharmacy to clear up the symptoms  [id number] [place] Phone:  [phone]:  [phone]   Please call [phone]

## 2021-01-26 NOTE — Progress Notes (Signed)
Virtual Visit via Video Note  I connected with Pacen  on 01/26/21 at  4:00 PM EDT by a video enabled telemedicine application and verified that I am speaking with the correct person using two identifiers.  Location patient: home, [place] Location provider:work or home office Persons participating in the virtual visit: patient, provider  I discussed the limitations of evaluation and management by telemedicine and the availability of in person appointments. The patient expressed understanding and agreed to proceed.   HPI:  Acute telemedicine visit for nasal congestion: -Onset: about a month ago when had covid -Symptoms include: sinus congestion, thick mucus, cough -Denies:fevers, CP, SOB, NVD, inability to eat/drink/get out of bed -Pertinent past medical history: see below -Pertinent medication allergies:  Allergies  Allergen Reactions   Lisinopril Cough  -COVID-19 vaccine status: vaccinated x 2   ROS: See pertinent positives and negatives per HPI.  Past Medical History:  Diagnosis Date   Hyperlipidemia    Hypertension    Hypogonadism in male 11/01/2016   Hypothyroidism    Multiple sclerosis ([place])    Proliferative diabetic retinopathy(362.02)    Type 1 diabetes mellitus ([place]) dx'd 1994   Vitamin D insufficiency 11/29/2016    Past Surgical History:  Procedure Laterality Date   EYE SURGERY Bilateral    "laser for diabetic retinopathy"   FRACTURE SURGERY     IR VENO/EXT/UNI LEFT  03/24/2020   OPEN REDUCTION INTERNAL FIXATION (ORIF) TIBIA/FIBULA FRACTURE Right 10/30/2016   ORIF ANKLE FRACTURE Left 2015   ORIF TIBIA FRACTURE Right 10/30/2016   Procedure: OPEN REDUCTION INTERNAL FIXATION (ORIF) TIBIA FIBULA FRACTURE;  Surgeon: Altamese Balfour, MD;  Location: [facility];  Service: Orthopedics;  Laterality: Right;   RETINAL LASER PROCEDURE Bilateral    "for diabetic retinopathy"     Current Outpatient Medications:    amoxicillin-clavulanate (AUGMENTIN) 875-125 MG tablet, Take 1 tablet by  mouth 2 (two) times daily., Disp: 20 tablet, Rfl: 0   baclofen (LIORESAL) 10 MG tablet, Take one or two pills three times a day (up to 6 pills/day), Disp: 180 each, Rfl: 11   Continuous Blood Gluc Receiver (DEXCOM G5 RECEIVER KIT) DEVI, by Does not apply route., Disp: , Rfl:    Continuous Blood Gluc Sensor (DEXCOM G6 SENSOR) MISC, Use as directed., Disp: 12 each, Rfl: 1   Continuous Blood Gluc Transmit (DEXCOM G6 TRANSMITTER) MISC, Use to monitor blood sugar, Disp: 1 each, Rfl: 0   dalfampridine (AMPYRA) 10 MG TB12, Take 1 tablet (10 mg total) by mouth 2 (two) times daily., Disp: 180 tablet, Rfl: 3   diazepam (VALIUM) 5 MG tablet, Take 1 tablet (5 mg total) by mouth every 6 (six) hours as needed for anxiety. 1/2 to 1 pill qHS, Disp: 30 tablet, Rfl: 3   Glucagon (GVOKE HYPOPEN 2-PACK) 1 MG/0.2ML SOAJ, Inject 1 Act into the skin daily as needed., Disp: 2 mL, Rfl: 5   glucose blood (CONTOUR NEXT TEST) test strip, Use as instructed. USE AS DIRECTED 8 TIMES PER DAY. E10.65, Disp: 300 strip, Rfl: 3   insulin aspart (NOVOLOG) 100 UNIT/ML injection, USE UP TO 80 UNITS IN INSULIN PUMP DAILY-DX CODE E10.8, Disp: 80 mL, Rfl: 2   losartan (COZAAR) 50 MG tablet, Take 1 tablet (50 mg total) by mouth daily., Disp: 90 tablet, Rfl: 3   simvastatin (ZOCOR) 20 MG tablet, TAKE 1 TABLET BY MOUTH EVERYDAY AT BEDTIME, Disp: 90 tablet, Rfl: 3   solifenacin (VESICARE) 5 MG tablet, TAKE 1 TABLET (5 MG TOTAL) BY MOUTH DAILY.,  Disp: 90 tablet, Rfl: 0   SYNTHROID 125 MCG tablet, TAKE 1 TABLET BY MOUTH 6 DAYS PER WEEK, Disp: 72 tablet, Rfl: 1   tamsulosin (FLOMAX) 0.4 MG CAPS capsule, Take 1 capsule (0.4 mg total) by mouth daily., Disp: 30 capsule, Rfl: 5  EXAM:  VITALS per patient if applicable:  GENERAL: alert, oriented, appears well and in no acute distress  HEENT: atraumatic, conjunttiva clear, no obvious abnormalities on inspection of external nose and ears  NECK: normal movements of the head and neck  LUNGS: on  inspection no signs of respiratory distress, breathing rate appears normal, no obvious gross SOB, gasping or wheezing  CV: no obvious cyanosis  MS: moves all visible extremities without noticeable abnormality  PSYCH/NEURO: pleasant and cooperative, no obvious depression or anxiety, speech and thought processing grossly intact  ASSESSMENT AND PLAN:  Discussed the following assessment and plan:  Cough  Nasal sinus congestion  -we discussed possible serious and likely etiologies, options for evaluation and workup, limitations of telemedicine visit vs in person visit, treatment, treatment risks and precautions. Pt is agreeable to treatment via telemedicine at this moment. Given duration of symptoms, underlying health issues, thick mucus query sinusitis vs post covid symptoms vs other. He opted to try nasal saline and Augmentin 875 bid x 7-10 days.  Advised to seek prompt in person care if worsening, new symptoms arise, or if is not improving with treatment. Discussed options for inperson care if PCP office not available. Did let this patient know that I only do telemedicine on Tuesdays and Thursdays for [facility]. Advised to schedule follow up visit with PCP or [facility] if any further questions or concerns to avoid delays in care.   I discussed the assessment and treatment plan with the patient. The patient was provided an opportunity to ask questions and all were answered. The patient agreed with the plan and demonstrated an understanding of the instructions.     Lucretia Kern, DO

## 2021-01-31 ENCOUNTER — Ambulatory Visit (INDEPENDENT_AMBULATORY_CARE_PROVIDER_SITE_OTHER): Payer: Medicare Other

## 2021-01-31 ENCOUNTER — Encounter (HOSPITAL_COMMUNITY): Payer: Self-pay | Admitting: Emergency Medicine

## 2021-01-31 ENCOUNTER — Other Ambulatory Visit: Payer: Self-pay

## 2021-01-31 ENCOUNTER — Ambulatory Visit (HOSPITAL_COMMUNITY)
Admission: EM | Admit: 2021-01-31 | Discharge: 2021-01-31 | Disposition: A | Discharge status: Home / Self Care | Payer: Medicare Other | Attending: Emergency Medicine | Admitting: Emergency Medicine

## 2021-01-31 DIAGNOSIS — W19XXXA Unspecified fall, initial encounter: Secondary | ICD-10-CM | POA: Diagnosis not present

## 2021-01-31 DIAGNOSIS — R0781 Pleurodynia: Secondary | ICD-10-CM | POA: Diagnosis not present

## 2021-01-31 DIAGNOSIS — S2232XA Fracture of one rib, left side, initial encounter for closed fracture: Secondary | ICD-10-CM

## 2021-01-31 NOTE — Discharge Instructions (Addendum)
You can take Tylenol and/or Ibuprofen as needed for pain relief.   You can apply ice as needed for comfort.  Brace your side when coughing or taking a deep breath to help with pain.  Do coughing and deep breathing exercises throughout the day.   Return or go to the Emergency Department if symptoms worsen or do not improve in the next few days.

## 2021-01-31 NOTE — ED Provider Notes (Signed)
[facility]    CSN: [id number] Arrival date & time: 01/31/21  0932      History   Chief Complaint Chief Complaint  Patient presents with   Fall    HPI Johnathan Ross is a 50 y.o. male.   Patient here for evaluation of left rib pain following a fall that occurred yesterday.  Patient reports that he was walking in his house and his foot dragged causing him to fall forward.  Patient has recently had surgery on both ankles.  Reports rib pain worse with movement and deep inhalation.  Has not taken any OTC medications or treatments.  Denies any fevers, chest pain, shortness of breath, N/V/D, numbness, tingling, weakness, abdominal pain, or headaches.    The history is provided by the patient.  Fall   Past Medical History:  Diagnosis Date   Hyperlipidemia    Hypertension    Hypogonadism in male 11/01/2016   Hypothyroidism    Multiple sclerosis ([place])    Proliferative diabetic retinopathy(362.02)    Type 1 diabetes mellitus ([place]) dx'd 1994   Vitamin D insufficiency 11/29/2016    Patient Active Problem List   Diagnosis Date Noted   Clinical diagnosis of COVID-19 12/28/2020   OAB (overactive bladder) 09/21/2020   Conjunctival injection, right 09/21/2020   Essential hypertension 09/21/2020   Urinary dysfunction 09/21/2020   Thoracic outlet syndrome 05/03/2020   Controlled type 1 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 ([place]) 03/24/2020   Right epiretinal membrane 03/24/2020   Retinal exudates and deposits 03/24/2020   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   Nuclear sclerotic cataract of both eyes 03/24/2020   Gait disorder 03/23/2020   Benign prostatic hyperplasia without lower urinary tract symptoms 03/01/2020   Diabetic gastroparesis associated with type 1 diabetes mellitus ([place]) 03/01/2020   Uncontrolled type 1 diabetes mellitus with hyperglycemia ([place]) 03/01/2020   Gait disturbance 08/19/2019   Acquired diplegia ([place]) 08/19/2019    Vitamin D insufficiency 11/29/2016   Hypogonadism in male 11/01/2016   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[place])    Hypothyroidism    Hypertension    Abnormal liver function tests 03/06/2013   Type I (juvenile type) diabetes mellitus without mention of complication, uncontrolled 12/03/2007   HYPERCHOLESTEROLEMIA 11/12/2007   MULTIPLE SCLEROSIS 08/29/2007   PROLIFERATIVE DIABETIC RETINOPATHY 08/29/2007    Past Surgical History:  Procedure Laterality Date   EYE SURGERY Bilateral    "laser for diabetic retinopathy"   FRACTURE SURGERY     IR VENO/EXT/UNI LEFT  03/24/2020   OPEN REDUCTION INTERNAL FIXATION (ORIF) TIBIA/FIBULA FRACTURE Right 10/30/2016   ORIF ANKLE FRACTURE Left 2015   ORIF TIBIA FRACTURE Right 10/30/2016   Procedure: OPEN REDUCTION INTERNAL FIXATION (ORIF) TIBIA FIBULA FRACTURE;  Surgeon: Altamese Hagaman, MD;  Location: [facility];  Service: Orthopedics;  Laterality: Right;   RETINAL LASER PROCEDURE Bilateral    "for diabetic retinopathy"       Home Medications    Prior to Admission medications   Medication Sig Start Date End Date Taking? Authorizing Provider  amoxicillin-clavulanate (AUGMENTIN) 875-125 MG tablet Take 1 tablet by mouth 2 (two) times daily. 01/26/21   Lucretia Kern, DO  baclofen (LIORESAL) 10 MG tablet Take one or two pills three times a day (up to 6 pills/day) 09/05/20   Sater, Nanine Means, MD  Continuous Blood Gluc Receiver ([place] KIT) DEVI by Does not apply route.    [provider]  Continuous Blood Gluc Sensor (DEXCOM G6  SENSOR) MISC Use as directed. 04/11/20   Elayne Snare, MD  Continuous Blood Gluc Transmit (DEXCOM G6 TRANSMITTER) MISC Use to monitor blood sugar 04/11/20   Elayne Snare, MD  dalfampridine (AMPYRA) 10 MG TB12 Take 1 tablet (10 mg total) by mouth 2 (two) times daily. 12/22/20   Sater, Nanine Means, MD  diazepam (VALIUM) 5 MG tablet Take 1 tablet (5 mg total) by mouth every 6 (six) hours as needed for  anxiety. 1/2 to 1 pill qHS Patient not taking: Reported on 01/31/2021 09/21/20   Sater, Nanine Means, MD  Glucagon (GVOKE HYPOPEN 2-PACK) 1 MG/0.2ML SOAJ Inject 1 Act into the skin daily as needed. 09/21/20   Janith Lima, MD  glucose blood (CONTOUR NEXT TEST) test strip Use as instructed. USE AS DIRECTED 8 TIMES PER DAY. E10.65 08/29/20   Elayne Snare, MD  insulin aspart (NOVOLOG) 100 UNIT/ML injection USE UP TO 80 UNITS IN INSULIN PUMP DAILY-DX CODE E10.8 06/10/20   Elayne Snare, MD  losartan (COZAAR) 50 MG tablet Take 1 tablet (50 mg total) by mouth daily. 07/14/20   Elayne Snare, MD  simvastatin (ZOCOR) 20 MG tablet TAKE 1 TABLET BY MOUTH EVERYDAY AT BEDTIME 12/03/20   Elayne Snare, MD  solifenacin (VESICARE) 5 MG tablet TAKE 1 TABLET (5 MG TOTAL) BY MOUTH DAILY. Patient not taking: Reported on 01/31/2021 12/18/20   Janith Lima, MD  SYNTHROID 125 MCG tablet TAKE 1 TABLET BY MOUTH 6 DAYS PER WEEK 01/05/21   Elayne Snare, MD  tamsulosin (FLOMAX) 0.4 MG CAPS capsule Take 1 capsule (0.4 mg total) by mouth daily. 09/21/20   Sater, Nanine Means, MD    Family History Family History  Problem Relation Age of Onset   Diabetes Sister    Colon cancer Maternal Grandmother    Colon polyps Maternal Uncle     Social History Social History   Tobacco Use   Smoking status: Never   Smokeless tobacco: Never  Vaping Use   Vaping Use: Never used  Substance Use Topics   Alcohol use: No   Drug use: No     Allergies   Lisinopril   Review of Systems Review of Systems  Musculoskeletal:  Positive for arthralgias.  All other systems reviewed and are negative.   Physical Exam Triage Vital Signs ED Triage Vitals  Enc Vitals Group     BP 01/31/21 1034 137/72     Pulse Rate 01/31/21 1034 86     Resp 01/31/21 1034 20     Temp 01/31/21 1034 98.5 F (36.9 C)     Temp Source 01/31/21 1034 Oral     SpO2 01/31/21 1034 100 %     Weight --      Height --      Head Circumference --      Peak Flow --      Pain  Score 01/31/21 1029 8     Pain Loc --      Pain Edu? --      Excl. in [place]? --    No data found.  Updated Vital Signs BP 137/72 (BP Location: Left Arm)   Pulse 86   Temp 98.5 F (36.9 C) (Oral)   Resp 20   SpO2 100%   Visual Acuity Right Eye Distance:   Left Eye Distance:   Bilateral Distance:    Right Eye Near:   Left Eye Near:    Bilateral Near:     Physical Exam Vitals and nursing note reviewed.  Constitutional:      General: He is not in acute distress.    Appearance: Normal appearance. He is not ill-appearing, toxic-appearing or diaphoretic.  HENT:     Head: Normocephalic and atraumatic.  Eyes:     Conjunctiva/sclera: Conjunctivae normal.  Cardiovascular:     Rate and Rhythm: Normal rate.     Pulses: Normal pulses.     Heart sounds: Normal heart sounds.  Pulmonary:     Effort: Pulmonary effort is normal.     Breath sounds: Normal breath sounds.  Chest:     Chest wall: Tenderness (left lower ribcage) present. No mass, swelling or crepitus.  Abdominal:     General: Abdomen is flat.  Musculoskeletal:        General: Normal range of motion.     Cervical back: Normal range of motion.  Skin:    General: Skin is warm and dry.  Neurological:     General: No focal deficit present.     Mental Status: He is alert and oriented to person, place, and time.  Psychiatric:        Mood and Affect: Mood normal.     UC Treatments / Results  Labs (all labs ordered are listed, but only abnormal results are displayed) Labs Reviewed - No data to display  EKG   Radiology DG Ribs Unilateral W/Chest Left  Result Date: 01/31/2021 CLINICAL DATA:  Fall with left-sided rib pain. EXAM: LEFT RIBS AND CHEST - 3+ VIEW COMPARISON:  05/09/2020 FINDINGS: Frontal view of the chest and two views of left-sided ribs. Midline trachea. Normal heart size and mediastinal contours. No pleural effusion or pneumothorax. Right apical pleuroparenchymal scarring. Diffuse peribronchial thickening.  Rib radiographs demonstrate deformity of the anterolateral left tenth rib, site of fracture on the prior exam. IMPRESSION: 1. No acute rib fracture, pleural fluid, or pneumothorax. 2. Peribronchial thickening which may relate to chronic bronchitis or smoking. 3. Remote fracture of the left tenth rib. Electronically Signed   By: Abigail Miyamoto M.D.   On: 01/31/2021 11:21    Procedures Procedures (including critical care time)  Medications Ordered in UC Medications - No data to display  Initial Impression / Assessment and Plan / UC Course  I have reviewed the triage vital signs and the nursing notes.  Pertinent labs & imaging results that were available during my care of the patient were reviewed by me and considered in my medical decision making (see chart for details).    Assessment negative for red flags or concerns.  Xray shows remote fracture of the 10th left rib but no pleural fluid or pneumothorax.  Recommend Tylenol and/or ibuprofen as needed.  May brace side when coughing or taking a deep breath.  Recommend cough and deep breathing exercises periodically throughout the day to help prevent infection.  Follow-up as needed Final Clinical Impressions(s) / UC Diagnoses   Final diagnoses:  Closed fracture of one rib of left side, initial encounter  Fall, initial encounter     Discharge Instructions      You can take Tylenol and/or Ibuprofen as needed for pain relief.   You can apply ice as needed for comfort.  Brace your side when coughing or taking a deep breath to help with pain.  Do coughing and deep breathing exercises throughout the day.   Return or go to the Emergency Department if symptoms worsen or do not improve in the next few days.      ED Prescriptions   None  PDMP not reviewed this encounter.   Pearson Forster, NP 01/31/21 1157

## 2021-01-31 NOTE — ED Triage Notes (Addendum)
Patient had a fall yesterday.  Reports pain in left ribcage.  Reports arm of chair caught him in left torso before he could catch self with his hands  Patient reports falling when walking with hands full.  Was inside the house.  Patient has had surgery on both ankles and reports one foot dragged when stepping forward , causing a fall.  Patient arrived in department using a walker

## 2021-02-01 ENCOUNTER — Encounter (INDEPENDENT_AMBULATORY_CARE_PROVIDER_SITE_OTHER): Payer: Medicare Other | Admitting: Ophthalmology

## 2021-02-01 ENCOUNTER — Other Ambulatory Visit: Payer: Self-pay

## 2021-02-01 ENCOUNTER — Encounter (INDEPENDENT_AMBULATORY_CARE_PROVIDER_SITE_OTHER): Payer: Self-pay | Admitting: Ophthalmology

## 2021-02-01 ENCOUNTER — Ambulatory Visit (INDEPENDENT_AMBULATORY_CARE_PROVIDER_SITE_OTHER): Payer: Medicare Other | Admitting: Ophthalmology

## 2021-02-01 DIAGNOSIS — E103553 Type 1 diabetes mellitus with stable proliferative diabetic retinopathy, bilateral: Secondary | ICD-10-CM | POA: Diagnosis not present

## 2021-02-01 DIAGNOSIS — H2513 Age-related nuclear cataract, bilateral: Secondary | ICD-10-CM

## 2021-02-01 NOTE — Assessment & Plan Note (Addendum)
Stable OU at this time, no symptoms.  History of follow-up in the past with [facility], Dr. Nathanial Rancher and sons

## 2021-02-01 NOTE — Assessment & Plan Note (Signed)

## 2021-02-01 NOTE — Progress Notes (Signed)
02/01/2021     CHIEF COMPLAINT Patient presents for  Chief Complaint  Patient presents with   Retina Follow Up      HISTORY OF PRESENT ILLNESS: Johnathan Ross is a 50 y.o. male who presents to the clinic today for:   HPI     Retina Follow Up   Patient presents with  Diabetic Retinopathy.  In both eyes.  This started 10 months ago.  Duration of 10 months.        Comments   10 month f/u OU with Fundus Photos OU  Pt states he had sustained an abrasion on the right eye, ~ 1 month ago, treated with antibiotic gtts and BCL x 7 days, resolved. Pt states vision is stable and unchanged since previous visit. Pt c/o seeing floaters in the right from time to time, started following laser treatments, very seldom. Pt denies any flashes of light in either eye. Pt does c/o seeing a darkness fade into his vision, "almost like a burning piece of paper, burning from the center outward, " this occurs initially after closing his eyes and does not return. Pt denies any pain in or around the eye.  Type 1 Diabetic. A1c: 7.3 (~ 3 mos ago)  Eye Meds: Muro TID OD      Last edited by Reather Littler, COA on 02/01/2021 11:09 AM.      Referring physician: Janith Lima, MD [place],  [place] [id number]  HISTORICAL INFORMATION:   Selected notes from the MEDICAL RECORD NUMBER    Lab Results  Component Value Date   HGBA1C 7.9 (H) 10/10/2020     CURRENT MEDICATIONS: No current outpatient medications on file. (Ophthalmic Drugs)   No current facility-administered medications for this visit. (Ophthalmic Drugs)   Current Outpatient Medications (Other)  Medication Sig   amoxicillin-clavulanate (AUGMENTIN) 875-125 MG tablet Take 1 tablet by mouth 2 (two) times daily.   baclofen (LIORESAL) 10 MG tablet Take one or two pills three times a day (up to 6 pills/day)   Continuous Blood Gluc Receiver (DEXCOM G5 RECEIVER KIT) DEVI by Does not apply route.   Continuous Blood Gluc Sensor  (DEXCOM G6 SENSOR) MISC Use as directed.   Continuous Blood Gluc Transmit (DEXCOM G6 TRANSMITTER) MISC Use to monitor blood sugar   dalfampridine (AMPYRA) 10 MG TB12 Take 1 tablet (10 mg total) by mouth 2 (two) times daily.   diazepam (VALIUM) 5 MG tablet Take 1 tablet (5 mg total) by mouth every 6 (six) hours as needed for anxiety. 1/2 to 1 pill qHS (Patient not taking: Reported on 01/31/2021)   Glucagon (GVOKE HYPOPEN 2-PACK) 1 MG/0.2ML SOAJ Inject 1 Act into the skin daily as needed.   glucose blood (CONTOUR NEXT TEST) test strip Use as instructed. USE AS DIRECTED 8 TIMES PER DAY. E10.65   insulin aspart (NOVOLOG) 100 UNIT/ML injection USE UP TO 80 UNITS IN INSULIN PUMP DAILY-DX CODE E10.8   losartan (COZAAR) 50 MG tablet Take 1 tablet (50 mg total) by mouth daily.   simvastatin (ZOCOR) 20 MG tablet TAKE 1 TABLET BY MOUTH EVERYDAY AT BEDTIME   solifenacin (VESICARE) 5 MG tablet TAKE 1 TABLET (5 MG TOTAL) BY MOUTH DAILY. (Patient not taking: Reported on 01/31/2021)   SYNTHROID 125 MCG tablet TAKE 1 TABLET BY MOUTH 6 DAYS PER WEEK   tamsulosin (FLOMAX) 0.4 MG CAPS capsule Take 1 capsule (0.4 mg total) by mouth daily.   No current facility-administered medications for this  visit. (Other)      REVIEW OF SYSTEMS:    ALLERGIES Allergies  Allergen Reactions   Lisinopril Cough    PAST MEDICAL HISTORY Past Medical History:  Diagnosis Date   Hyperlipidemia    Hypertension    Hypogonadism in male 11/01/2016   Hypothyroidism    Multiple sclerosis ([place])    Proliferative diabetic retinopathy(362.02)    Type 1 diabetes mellitus ([place]) dx'd 1994   Vitamin D insufficiency 11/29/2016   Past Surgical History:  Procedure Laterality Date   EYE SURGERY Bilateral    "laser for diabetic retinopathy"   FRACTURE SURGERY     IR VENO/EXT/UNI LEFT  03/24/2020   OPEN REDUCTION INTERNAL FIXATION (ORIF) TIBIA/FIBULA FRACTURE Right 10/30/2016   ORIF ANKLE FRACTURE Left 2015   ORIF TIBIA FRACTURE Right  10/30/2016   Procedure: OPEN REDUCTION INTERNAL FIXATION (ORIF) TIBIA FIBULA FRACTURE;  Surgeon: Altamese Wiscon, MD;  Location: [facility];  Service: Orthopedics;  Laterality: Right;   RETINAL LASER PROCEDURE Bilateral    "for diabetic retinopathy"    FAMILY HISTORY Family History  Problem Relation Age of Onset   Diabetes Sister    Colon cancer Maternal Grandmother    Colon polyps Maternal Uncle     SOCIAL HISTORY Social History   Tobacco Use   Smoking status: Never   Smokeless tobacco: Never  Vaping Use   Vaping Use: Never used  Substance Use Topics   Alcohol use: No   Drug use: No         OPHTHALMIC EXAM:  Base Eye Exam     Visual Acuity (ETDRS)       Right Left   Dist [place] 20/20 -2 20/25 +2         Tonometry (Tonopen, 11:14 AM)       Right Left   Pressure 10 12         Pupils       Pupils Dark Light Shape React APD   Right PERRL 5 4 Round Slow None   Left PERRL 5 4 Round Slow None         Visual Fields (Counting fingers)       Left Right    Full Full         Extraocular Movement       Right Left    Full, Ortho Full, Ortho         Neuro/Psych     Oriented x3: Yes   Mood/Affect: Normal         Dilation     Both eyes: 1.0% Mydriacyl, 2.5% Phenylephrine @ 11:14 AM           Slit Lamp and Fundus Exam     External Exam       Right Left   External Normal Normal         Slit Lamp Exam       Right Left   Lids/Lashes Normal Normal   Conjunctiva/Sclera White and quiet, with large eyelash surrounded and case by purulent debris in the cul-de-sac and in the nasal fornix White and quiet   Cornea Clear Clear   Anterior Chamber Deep and quiet Deep and quiet   Iris Round and reactive Round and reactive   Lens 2+ Nuclear sclerosis 2+ Nuclear sclerosis   Anterior Vitreous Normal Normal         Fundus Exam       Right Left   Posterior Vitreous Clear vitrectomized Clear vitrectomized   Disc Old fibrous  tissue superotemporal,  inactive, no traction, no vascularity Normal   C/D Ratio 0.45 0.55   Macula Good focal, microaneurysms, no CSME Good focal, microaneurysms, no CSME   Vessels PDR-quiet PDR-quiet   Periphery Good PRP 360 Good PRP 360            IMAGING AND PROCEDURES  Imaging and Procedures for 02/01/21  Color Fundus Photography Optos - OU - Both Eyes       Right Eye Progression has been stable. Disc findings include increased cup to disc ratio. Macula : microaneurysms.   Left Eye Progression has been stable. Disc findings include increased cup to disc ratio. Macula : microaneurysms.   Notes Good PRP, PDR quiescent, no active disease, with good PRP             ASSESSMENT/PLAN:  Controlled type 1 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 ([place]) The nature of regressed proliferative diabetic retinopathy was discussed with the patient. The patient was advised to maintain good glucose, blood pressure, monitor kidney function and serum lipid control as advised by personal physician. Rare risk for reactivation of progression exist with untreated severe anemia, untreated renal failure, untreated heart failure, and smoking. Complete avoidance of smoking was recommended. The chance of recurrent proliferative diabetic retinopathy was discussed as well as the chance of vitreous hemorrhage for which further treatments may be necessary.   Explained to the patient that the quiescent  proliferative diabetic retinopathy disease is unlikely to ever worsen.  Worsening factors would include however severe anemia, hypertension out-of-control or impending renal failure.  Nuclear sclerotic cataract of both eyes Stable OU at this time, no symptoms.  History of follow-up in the past with [facility], Dr. Nathanial Rancher and sons     ICD-10-CM   1. Controlled type 1 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 (HCC)  Y81.4481 Color Fundus Photography Optos - OU -  Both Eyes    2. Nuclear sclerotic cataract of both eyes  H25.13       1.  OU stable, quiescent PDR.  Old fibrous tissue along the superotemporal arcade OD is stable now for nearly a decade.  No active maculopathy.  We will continue to observe  2.  OU with mild nuclear sclerotic cataract, no progressive changes no impact on acuity observe  3.  Ophthalmic Meds Ordered this visit:  No orders of the defined types were placed in this encounter.      Return in about 9 months (around 11/01/2021) for DILATE OU, COLOR FP, OCT.  There are no Patient Instructions on file for this visit.   Explained the diagnoses, plan, and follow up with the patient and they expressed understanding.  Patient expressed understanding of the importance of proper follow up care.   Clent Demark Adisyn Ruscitti M.D. Diseases & Surgery of the Retina and Vitreous Retina [facility] Diabetic [place] 02/01/21     Abbreviations: M myopia (nearsighted); A astigmatism; H hyperopia (farsighted); P presbyopia; Mrx spectacle prescription;  CTL contact lenses; OD right eye; OS left eye; OU both eyes  XT exotropia; ET esotropia; PEK punctate epithelial keratitis; PEE punctate epithelial erosions; DES dry eye syndrome; MGD meibomian gland dysfunction; ATs artificial tears; PFAT's preservative free artificial tears; [place] nuclear sclerotic cataract; PSC posterior subcapsular cataract; ERM epi-retinal membrane; PVD posterior vitreous detachment; RD retinal detachment; DM diabetes mellitus; DR diabetic retinopathy; NPDR non-proliferative diabetic retinopathy; PDR proliferative diabetic retinopathy; CSME clinically significant macular edema; DME diabetic macular edema; dbh dot  blot hemorrhages; CWS cotton wool spot; POAG primary open angle glaucoma; C/D cup-to-disc ratio; HVF humphrey visual field; GVF goldmann visual field; OCT optical coherence tomography; IOP intraocular pressure; BRVO Branch retinal vein occlusion; CRVO central retinal vein occlusion;  CRAO central retinal artery occlusion; BRAO branch retinal artery occlusion; RT retinal tear; SB scleral buckle; PPV pars plana vitrectomy; VH Vitreous hemorrhage; PRP panretinal laser photocoagulation; IVK intravitreal kenalog; VMT vitreomacular traction; MH Macular hole;  NVD neovascularization of the disc; NVE neovascularization elsewhere; AREDS age related eye disease study; ARMD age related macular degeneration; POAG primary open angle glaucoma; EBMD epithelial/anterior basement membrane dystrophy; ACIOL anterior chamber intraocular lens; IOL intraocular lens; PCIOL posterior chamber intraocular lens; Phaco/IOL phacoemulsification with intraocular lens placement; [place] photorefractive keratectomy; LASIK laser assisted in situ keratomileusis; HTN hypertension; DM diabetes mellitus; COPD chronic obstructive pulmonary disease

## 2021-02-09 ENCOUNTER — Other Ambulatory Visit: Payer: Self-pay

## 2021-02-09 ENCOUNTER — Other Ambulatory Visit (INDEPENDENT_AMBULATORY_CARE_PROVIDER_SITE_OTHER): Payer: Medicare Other

## 2021-02-09 DIAGNOSIS — E782 Mixed hyperlipidemia: Secondary | ICD-10-CM | POA: Diagnosis not present

## 2021-02-09 DIAGNOSIS — E039 Hypothyroidism, unspecified: Secondary | ICD-10-CM

## 2021-02-09 DIAGNOSIS — E1065 Type 1 diabetes mellitus with hyperglycemia: Secondary | ICD-10-CM | POA: Diagnosis not present

## 2021-02-09 LAB — BASIC METABOLIC PANEL
BUN: 12 mg/dL (ref 6–23)
CO2: 31 mEq/L (ref 19–32)
Calcium: 9.3 mg/dL (ref 8.4–10.5)
Chloride: 101 mEq/L (ref 96–112)
Creatinine, Ser: 1.12 mg/dL (ref 0.40–1.50)
GFR: 77.68 mL/min (ref >60.00)
Glucose, Bld: 161 mg/dL — ABNORMAL HIGH (ref 70–99)
Potassium: 4.3 mEq/L (ref 3.5–5.1)
Sodium: 139 mEq/L (ref 135–145)

## 2021-02-09 LAB — LDL CHOLESTEROL, DIRECT: Direct LDL: 65 mg/dL

## 2021-02-09 LAB — TSH: TSH: 4.67 u[IU]/mL (ref 0.35–5.50)

## 2021-02-09 LAB — HEMOGLOBIN A1C: Hgb A1c MFr Bld: 7.6 % — ABNORMAL HIGH (ref 4.6–6.5)

## 2021-02-13 ENCOUNTER — Telehealth: Payer: Medicare Other | Admitting: Endocrinology

## 2021-02-13 ENCOUNTER — Other Ambulatory Visit: Payer: Self-pay

## 2021-02-17 ENCOUNTER — Other Ambulatory Visit: Payer: Self-pay

## 2021-02-17 ENCOUNTER — Telehealth (INDEPENDENT_AMBULATORY_CARE_PROVIDER_SITE_OTHER): Payer: Medicare Other | Admitting: Endocrinology

## 2021-02-17 DIAGNOSIS — E7800 Pure hypercholesterolemia, unspecified: Secondary | ICD-10-CM

## 2021-02-17 DIAGNOSIS — E1065 Type 1 diabetes mellitus with hyperglycemia: Secondary | ICD-10-CM | POA: Diagnosis not present

## 2021-02-17 DIAGNOSIS — E039 Hypothyroidism, unspecified: Secondary | ICD-10-CM | POA: Diagnosis not present

## 2021-02-17 NOTE — Progress Notes (Signed)
Subjective:              Patient ID: Johnathan Ross, male   DOB: 1970/10/09, 50 y.o.   MRN: [id number]  I connected with the above-named patient by video enabled telemedicine application and verified that I am speaking with the correct person. The patient was explained the limitations of evaluation and management by telemedicine and the availability of in person appointments.  Patient also understood that there may be a patient responsible charge related to this service  Location of the patient: Patient's home  Location of the provider: Physician office Only the patient and myself were participating in the encounter The patient understood the above statements and agreed to proceed.    Diabetes  Diagnosis: Type 1 diabetes mellitus, date of diagnosis:  1995.   Insulin Pump followup:   CURRENT brand:  Medtronic  630 G   HISTORY: An insulin pump has been in use since 03/13/11.   PUMP settings are basal rate:  12 AM- 4 AM = 1.0, 4 AM-6 AM = 1.15,, 6 a.m.-9 AM = 2.05, 9 AM = 1. 85, 6 PM = 1.8, 9 PM = 1.8 and 10 PM = 1.85  Carbohydrate to insulin ratio: 8 AM = 1: 8, 12 noon = 1: 5, 8 PM = 1: 30 and 10 PM = 1: 10 Hyperglycemia correction factor: 1: 45 12 AM-7 AM, 1: 35 7 AM-8 PM,and after 8 PM 1: 30, target 130, active insulin time 4 hours.   Recent history:  His A1c is about the same at 7.6    Management reviewed from [facility] sensor and pump download, current history and problems identified:    He again did not make the changes in his pump as recommended on the last visit through the MyChart message except for the carb ratio at 6 PM  More recently appears to be suspending his pump almost every night after 12 a.m. for at least 6 hours because of fear of hypoglycemia even though his blood sugars at midnight are mostly high  He has had only a couple of low normal sugars overnight  However his blood sugars are still generally at the lowest point around 3-4 AM  As a result his blood  sugars are usually fairly high in the morning around 6-8 AM as much as 315 He was advised to get the sensor for the 630 guardian system but he has not talked to [employer] about this or his supplier  He appears to be taking very few boluses at mealtimes for carbohydrate coverage and has only sporadic boluses midday and early evening Even though he is eating a biscuit in the morning around 7-8 AM he is only appearing to be taking correction boluses Also will periodically have high readings in the early afternoon from late or missed boluses or inadequate boluses, similarly blood sugars may be high at midnight from not bolusing  CGM Dexcom G6 download for the last 2 weeks is interpreted as follows   Blood sugars are showing less amount of variability with standard deviation 52 compared to 72 On an average blood sugars are in the upper normal range of the target most of the time except slightly lower around 2-4 AM and 8-9 PM  HYPERGLYCEMIA is occurring periodically midday and early afternoon and occasionally late evening  POSTPRANDIAL spikes appear to be occurring mostly at lunch but occasionally after breakfast and dinner also  Also he usually has excessive rebound after low normal readings  Hypoglycemia has been  minimal with only a couple of low normal readings overnight or 6 PM and once low at 1 AM; some of the low sugars are preceded by high readings  Time in range compared to the last visit is improved, previously 57%  CGM use % of time 100  2-week average/GV 165/52  Time in range     64   %  % Time Above 180 28  % Time above 250 7  % Time Below 70 <1   Previous data:  CGM use % of time  98  2-week average/GV  172, CV 31  Time in range      57% was 55  % Time Above 180  43  % Time above 250  7.5  % Time Below 70  0.4     Wt Readings from Last 3 Encounters:  09/27/20 208 lb (94.3 kg)  09/21/20 208 lb 8 oz (94.6 kg)  09/21/20 208 lb (94.3 kg)     Lab Results  Component Value  Date   HGBA1C 7.6 (H) 02/09/2021   HGBA1C 7.9 (H) 10/10/2020   HGBA1C 7.3 (H) 07/12/2020   Lab Results  Component Value Date   MICROALBUR <0.7 03/01/2020   LDLCALC 116 (H) 10/10/2020   CREATININE 1.12 02/09/2021     OTHER active problems discussed today: See review of systems    Allergies as of 02/17/2021       Reactions   Lisinopril Cough        Medication List        Accurate as of February 17, 2021 10:04 AM. If you have any questions, ask your nurse or doctor.          amoxicillin-clavulanate 875-125 MG tablet Commonly known as: Augmentin Take 1 tablet by mouth 2 (two) times daily.   baclofen 10 MG tablet Commonly known as: LIORESAL Take one or two pills three times a day (up to 6 pills/day)   Contour Next Test test strip Generic drug: glucose blood Use as instructed. USE AS DIRECTED 8 TIMES PER DAY. E10.65   dalfampridine 10 MG Tb12 Commonly known as: Ampyra Take 1 tablet (10 mg total) by mouth 2 (two) times daily.   Dexcom G5 Receiver Kit Devi by Does not apply route.   Dexcom G6 Sensor Misc Use as directed.   Dexcom G6 Transmitter Misc Use to monitor blood sugar   diazepam 5 MG tablet Commonly known as: Valium Take 1 tablet (5 mg total) by mouth every 6 (six) hours as needed for anxiety. 1/2 to 1 pill qHS   Gvoke HypoPen 2-Pack 1 MG/0.2ML Soaj Generic drug: Glucagon Inject 1 Act into the skin daily as needed.   insulin aspart 100 UNIT/ML injection Commonly known as: NovoLOG USE UP TO 80 UNITS IN INSULIN PUMP DAILY-DX CODE E10.8   losartan 50 MG tablet Commonly known as: COZAAR Take 1 tablet (50 mg total) by mouth daily.   simvastatin 20 MG tablet Commonly known as: ZOCOR TAKE 1 TABLET BY MOUTH EVERYDAY AT BEDTIME   solifenacin 5 MG tablet Commonly known as: VESICARE TAKE 1 TABLET (5 MG TOTAL) BY MOUTH DAILY.   Synthroid 125 MCG tablet Generic drug: levothyroxine TAKE 1 TABLET BY MOUTH 6 DAYS PER WEEK   tamsulosin 0.4 MG  Caps capsule Commonly known as: FLOMAX Take 1 capsule (0.4 mg total) by mouth daily.        Allergies:  Allergies  Allergen Reactions   Lisinopril Cough    Past Medical History:  Diagnosis Date   Hyperlipidemia    Hypertension    Hypogonadism in male 11/01/2016   Hypothyroidism    Multiple sclerosis ([place])    Proliferative diabetic retinopathy(362.02)    Type 1 diabetes mellitus ([place]) dx'd 1994   Vitamin D insufficiency 11/29/2016    Past Surgical History:  Procedure Laterality Date   EYE SURGERY Bilateral    "laser for diabetic retinopathy"   FRACTURE SURGERY     IR VENO/EXT/UNI LEFT  03/24/2020   OPEN REDUCTION INTERNAL FIXATION (ORIF) TIBIA/FIBULA FRACTURE Right 10/30/2016   ORIF ANKLE FRACTURE Left 2015   ORIF TIBIA FRACTURE Right 10/30/2016   Procedure: OPEN REDUCTION INTERNAL FIXATION (ORIF) TIBIA FIBULA FRACTURE;  Surgeon: Altamese [place], MD;  Location: [facility];  Service: Orthopedics;  Laterality: Right;   RETINAL LASER PROCEDURE Bilateral    "for diabetic retinopathy"    Family History  Problem Relation Age of Onset   Diabetes Sister    Colon cancer Maternal Grandmother    Colon polyps Maternal Uncle     Social History:  reports that he has never smoked. He has never used smokeless tobacco. He reports that he does not drink alcohol and does not use drugs.    Review of Systems      HYPERCHOLESTEROLEMIA: He has been taking simvastatin 20 mg daily  LDL on the last visit was relatively higher possibly from inadequate diet  His liver function consistently normal  Lab Results  Component Value Date   CHOL 175 10/10/2020   HDL 48.10 10/10/2020   LDLCALC 116 (H) 10/10/2020   LDLDIRECT 65.0 02/09/2021   TRIG 52.0 10/10/2020   CHOLHDL 4 10/10/2020   Lab Results  Component Value Date   ALT 21 07/12/2020    HYPERTENSION: He had been on lisinopril with good control  of blood pressure  He is on losartan as he may have had cough with lisinopril  Has not  had any regular visits with his PCP in office also   BP Readings from Last 3 Encounters:  01/31/21 137/72  09/27/20 (!) 142/82  09/21/20 124/70    He has MULTIPLE sclerosis, is followed in [place] by neurologist and still not on treatment  HYPOTHYROIDISM: He has had longstanding primary 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 autoimmune  His TSH previously had been frequently variable and not clear why it fluctuates, more recently been requiring lower doses He also does not feel any different whether his thyroid level is high or low  His dose has been 125 mcg, 6 days a week with skipping on Sundays  He is able to get brand-name Synthroid and is using 90-day prescriptions  He is regular with taking his supplement before breakfast daily TSH is currently high normal   Lab Results  Component Value Date   TSH 4.67 02/09/2021   TSH 1.05 10/10/2020   TSH 1.07 07/12/2020   FREET4 1.06 07/12/2020   FREET4 0.75 03/14/2020   FREET4 1.21 12/03/2019        Objective:   Physical Exam  There were no vitals taken for this visit.           Assessment:      DIABETES type I on insulin pump:   See history of present illness for detailed discussion of current diabetes management, blood sugar patterns and problems identified  A1c is 7.6 and in the same general range as before  He is on 630 Medtronic pump with the Dexcom 6 sensor  He has better time in range compared to last visit  and less variability The improvement may be related to less consistent hyperglycemia and also less fluctuation from high and low swings overnight   However he still has enough tendency to low sugars overnight to indicate need for much lower basal rates  Even though he was supposed to have a 0.7 basal rate overnight he is still using 1.0 basal rate between midnight-4 AM  Not clear if he knows how to change his basal rates now  Also did not check on the GUARDIAN sensor as recommended which would prevent him from  getting hypoglycemia   As before he is afraid to bolus enough for his meals and is frequently not bolusing enough for lunch and sometimes supper and also skipping boluses frequently at breakfast time  Overall hypoglycemia has been minimal also  HYPOTHYROIDISM:  TSH normal but trending higher with 6 tablets a week of the 125 mcg    Lipids: Needs follow-up labs   HYPERTENSION: Blood pressure appears better controlled but needs to be determined in the office  Currently on losartan 50 mg    Plan:       He needs to start bolusing for all meals especially breakfast based on his carbohydrate intake Also make sure he boluses at the start of each meal at dinner and lunchtime regardless of the [place] blood sugar Since his blood sugars are sometimes low normal after evening bolus he can reduce carbohydrate ratio to 1: 7 at that time Needs to stop suspending his pump overnight with reducing his basal rate by 50% between midnight-4 AM and also reduced between 4 AM-7 AM  He will be sent instructions to have new settings as follows  12 AM- 4 AM = 0.5, 4 AM-6 AM = 1.0,, 6 a.m.-9 AM = 2.05, 9 AM = 1. 85, 6 PM = 1.8, 9 PM = 1.8 and 10 PM = 1.75  Carbohydrate to insulin ratio: 8 AM = 1:12, 12 noon = 1:6, 6 PM = 1: 7, 8 PM = 1: 7 and 10 PM = 1: 10  Sensitivity settings: 12 AM = 1: 45, 7 AM until 8 PM = 1: 35, 8 PM-12 AM = 1: 45  Blood sugar target 140-140    Will need to stop suspending the pump overnight unless the blood sugars are below 100 and only stop for 2 hours He will get the 630 guardian sensor unless he is able to get the 770 pump  Synthroid dosage will be 6-1/2 tablets a week of the 125 mcg  Follow-up in 3 months   There are no Patient Instructions on file for this visit.     Elayne Snare  Note: This office note was prepared with Estate agent. Any transcriptional errors that result from this process are unintentional.

## 2021-02-22 ENCOUNTER — Telehealth: Payer: Self-pay | Admitting: Endocrinology

## 2021-02-22 NOTE — Telephone Encounter (Signed)
Vm left for patient to call back.

## 2021-02-22 NOTE — Telephone Encounter (Signed)
Pt calling in for advice on how to set his settings on his pump. Please call [phone]

## 2021-02-22 NOTE — Telephone Encounter (Signed)
Patient called [facility]2 - wanting a call back for pump settings - [phone]

## 2021-02-23 NOTE — Telephone Encounter (Signed)
Patient call back , states no one called him back - I did let him know that someone tried to call him yesterday at 1:52PM and left him a voicemail.  Placed patient on hold to see if anyone could assist him at that moment. Patient hung up.

## 2021-02-23 NOTE — Telephone Encounter (Signed)
Called and spoke with pt. Provided previous settings, MyChart message sent as well.

## 2021-03-05 ENCOUNTER — Other Ambulatory Visit: Payer: Self-pay | Admitting: Endocrinology

## 2021-03-13 ENCOUNTER — Other Ambulatory Visit: Payer: Self-pay | Admitting: Neurology

## 2021-03-17 ENCOUNTER — Telehealth: Payer: Self-pay | Admitting: Endocrinology

## 2021-03-17 ENCOUNTER — Other Ambulatory Visit: Payer: Self-pay | Admitting: Internal Medicine

## 2021-03-17 DIAGNOSIS — N3281 Overactive bladder: Secondary | ICD-10-CM

## 2021-03-17 NOTE — Telephone Encounter (Signed)
Received and completed. Faxed over paperwork today.

## 2021-03-17 NOTE — Telephone Encounter (Signed)
Per pt [facility] sending over paperwork for new insulin pump.  Pt contact [phone]

## 2021-03-27 ENCOUNTER — Encounter: Payer: Self-pay | Admitting: Neurology

## 2021-03-27 ENCOUNTER — Ambulatory Visit (INDEPENDENT_AMBULATORY_CARE_PROVIDER_SITE_OTHER): Payer: Medicare Other | Admitting: Neurology

## 2021-03-27 ENCOUNTER — Other Ambulatory Visit: Payer: Self-pay

## 2021-03-27 VITALS — BP 123/67 | HR 86 | Ht 73.0 in | Wt 208.5 lb

## 2021-03-27 DIAGNOSIS — R269 Unspecified abnormalities of gait and mobility: Secondary | ICD-10-CM | POA: Diagnosis not present

## 2021-03-27 DIAGNOSIS — G830 Diplegia of upper limbs: Secondary | ICD-10-CM

## 2021-03-27 DIAGNOSIS — R39198 Other difficulties with micturition: Secondary | ICD-10-CM

## 2021-03-27 DIAGNOSIS — E1042 Type 1 diabetes mellitus with diabetic polyneuropathy: Secondary | ICD-10-CM

## 2021-03-27 DIAGNOSIS — G35 Multiple sclerosis: Secondary | ICD-10-CM | POA: Diagnosis not present

## 2021-03-27 NOTE — Progress Notes (Signed)
[facility]  PATIENT: Johnathan Ross DOB: 1971-04-15  REFERRING DOCTOR OR PCP: Jodi Mourning, FNP SOURCE: Patient, notes from Dr. Bjorn Loser (neurology), multiple MRI reports and images reviewed, lab reports.  _________________________________   HISTORICAL  CHIEF COMPLAINT:  Chief Complaint  Patient presents with   Follow-up    Rm 2, w mother. Here for 6 month MS f/u, on Lem[place]. Pt reports being stable. No issues or concerns.     HISTORY OF PRESENT ILLNESS:  Johnathan Ross is a 50 year old man who was diagnosed with relapsing remitting MS around 2007.     Update 03/27/2021: He had his 2 years of Lemtrada therapy in 2016 and 2017.  He tolerated the infusions well but has had continued worsening of gait and spasticity.  He denies any exacerbation.   MRI of the brain 07/27/2019 ([facility]) did not show any new lesions.  He uses furniture/walls around the house and  uses a walker mostly outside or a wheelchair for longer distance.  He has a lot of spasticity and was taking baclofen up to 15 pills a day (30 mg up to 5 times a day).  He has backed off and now rarely takes as it was not helping much.      He feels a little sleepy on high dose.   In the past he took tizanidine.  Spasticity increases when he uses his walker and lifts up with his arms.  Spasticity is also present at night.   He had a single series of Botox injections in November 2020 but felt weaker without any benefit of his spasticity.      He also takes dalfampridine 10 mg po bid and feels that there is some benefit..   The right leg is mildly worse than the left leg. . Both legs spasm up, a little worse on the right than the left of note, the spasticity helps him support his weight when he walks.  Arms are strong.  He uses the walker to walk mostly by lifting with his arms.      He notes mild numbness in his feet.  There is no dysesthetic pain in the feet.  He also has Type 1 DM in 1995 and has been on insulin.   On the insulin pump his HgBA1c is usually 7's.  Before it was mostly 11-13.   He denies difficulties with his vision.  He has urinary urgency but also has some hesitancy and does not completely empty (usually has to go back one or two more times).  Flomax 0.4 mg helps slightly.  Vesicare has not helped much.   We discussed seeing urology.    He reports some fatigue.  He sleeps well some nights but other nights wakes up.  He denies much difficulties with cognition.  He denies mood disturbance.     MS history:  In 2007, he presented with spasms in his left leg.   He saw Dr. Bjorn Loser in [place] and was started Rebif and had some progression and also had difficulty tolerating it.    He went on [place] in 2016 and had his second year in 2017.        Imaging studies: MRIs of the brain and cervical spine from 07/27/2019:  The MRI of the spine shows several T2 hyperintense foci, located posterior laterally bilaterally, right larger than left adjacent to C2-C3, laterally to the left at C3-C4, centrally at C4-C5 also associated with spinal stenosis.  The MRI of the brain shows multiple  T2/FLAIR hyperintense foci in the periventricular and juxtacortical white matter and a couple punctate foci in the cerebellum.  None of the foci appear to be acute.  Other medical history: He has type 1 diabetes mellitus and is on insulin (pump).  He also has hypothyroidism and is on Synthroid.  He has well-controlled hypertension.  REVIEW OF SYSTEMS: Constitutional: No fevers, chills, sweats, or change in appetite.  He notes some fatigue. Eyes: No visual changes, double vision, eye pain.  He has had laser eye surgery Ear, nose and throat: No hearing loss, ear pain, nasal congestion, sore throat Cardiovascular: No chest pain, palpitations Respiratory:  No shortness of breath at rest or with exertion.   No wheezes GastrointestinaI: No nausea, vomiting, diarrhea, abdominal pain, fecal incontinence Genitourinary:  No  dysuria, urinary retention or frequency.  No nocturia. Musculoskeletal:  No neck pain, back pain Integumentary: No rash, pruritus, skin lesions Neurological: as above Psychiatric: No depression at this time.  No anxiety Endocrine: No palpitations, diaphoresis, change in appetite, change in weigh or increased thirst Hematologic/Lymphatic:  No anemia, purpura, petechiae. Allergic/Immunologic: No itchy/runny eyes, nasal congestion, recent allergic reactions, rashes  ALLERGIES: Allergies  Allergen Reactions   Lisinopril Cough    HOME MEDICATIONS:  Current Outpatient Medications:    amoxicillin-clavulanate (AUGMENTIN) 875-125 MG tablet, Take 1 tablet by mouth 2 (two) times daily., Disp: 20 tablet, Rfl: 0   baclofen (LIORESAL) 10 MG tablet, Take one or two pills three times a day (up to 6 pills/day), Disp: 180 each, Rfl: 11   Continuous Blood Gluc Receiver (DEXCOM G5 RECEIVER KIT) DEVI, by Does not apply route., Disp: , Rfl:    Continuous Blood Gluc Sensor (DEXCOM G6 SENSOR) MISC, Use as directed., Disp: 12 each, Rfl: 1   Continuous Blood Gluc Transmit (DEXCOM G6 TRANSMITTER) MISC, Use to monitor blood sugar, Disp: 1 each, Rfl: 0   dalfampridine (AMPYRA) 10 MG TB12, Take 1 tablet (10 mg total) by mouth 2 (two) times daily., Disp: 180 tablet, Rfl: 3   Glucagon (GVOKE HYPOPEN 2-PACK) 1 MG/0.2ML SOAJ, Inject 1 Act into the skin daily as needed., Disp: 2 mL, Rfl: 5   glucose blood (CONTOUR NEXT TEST) test strip, Use as instructed. USE AS DIRECTED 8 TIMES PER DAY. E10.65, Disp: 300 strip, Rfl: 3   losartan (COZAAR) 50 MG tablet, Take 1 tablet (50 mg total) by mouth daily., Disp: 90 tablet, Rfl: 3   NOVOLOG 100 UNIT/ML injection, USE UP TO 80 UNITS IN INSULIN PUMP DAILY-DX CODE E10.8, Disp: 70 mL, Rfl: 0   simvastatin (ZOCOR) 20 MG tablet, TAKE 1 TABLET BY MOUTH EVERYDAY AT BEDTIME, Disp: 90 tablet, Rfl: 3   solifenacin (VESICARE) 5 MG tablet, TAKE 1 TABLET (5 MG TOTAL) BY MOUTH DAILY., Disp: 90  tablet, Rfl: 0   SYNTHROID 125 MCG tablet, TAKE 1 TABLET BY MOUTH 6 DAYS PER WEEK, Disp: 72 tablet, Rfl: 1   tamsulosin (FLOMAX) 0.4 MG CAPS capsule, TAKE 1 CAPSULE BY MOUTH EVERY DAY, Disp: 90 capsule, Rfl: 1  PAST MEDICAL HISTORY: Past Medical History:  Diagnosis Date   Hyperlipidemia    Hypertension    Hypogonadism in male 11/01/2016   Hypothyroidism    Multiple sclerosis ([place])    Proliferative diabetic retinopathy(362.02)    Type 1 diabetes mellitus ([place]) dx'd 1994   Vitamin D insufficiency 11/29/2016    PAST SURGICAL HISTORY: Past Surgical History:  Procedure Laterality Date   EYE SURGERY Bilateral    "laser for diabetic retinopathy"  FRACTURE SURGERY     IR VENO/EXT/UNI LEFT  03/24/2020   OPEN REDUCTION INTERNAL FIXATION (ORIF) TIBIA/FIBULA FRACTURE Right 10/30/2016   ORIF ANKLE FRACTURE Left 2015   ORIF TIBIA FRACTURE Right 10/30/2016   Procedure: OPEN REDUCTION INTERNAL FIXATION (ORIF) TIBIA FIBULA FRACTURE;  Surgeon: Altamese [place], MD;  Location: [facility];  Service: Orthopedics;  Laterality: Right;   RETINAL LASER PROCEDURE Bilateral    "for diabetic retinopathy"    FAMILY HISTORY: Family History  Problem Relation Age of Onset   Diabetes Sister    Colon cancer Maternal Grandmother    Colon polyps Maternal Uncle     SOCIAL HISTORY:  Social History   Socioeconomic History   Marital status: Single    Spouse name: 12   Number of children: 1   Years of education: Not on file   Highest education level: Not on file  Occupational History   Occupation: unemployed  Tobacco Use   Smoking status: Never   Smokeless tobacco: Never  Vaping Use   Vaping Use: Never used  Substance and Sexual Activity   Alcohol use: No   Drug use: No   Sexual activity: Not on file  Other Topics Concern   Not on file  Social History Narrative   Right handed   No caffeine    Lives with girlfriend   Social Determinants of Health   Financial Resource Strain: Not on file  Food  Insecurity: Not on file  Transportation Needs: Not on file  Physical Activity: Not on file  Stress: Not on file  Social Connections: Not on file  Intimate Partner Violence: Not on file     PHYSICAL EXAM  Vitals:   03/27/21 1048  BP: 123/67  Pulse: 86  Weight: 208 lb 8 oz (94.6 kg)  Height: '6\' 1"'  (1.854 m)    Body mass index is 27.51 kg/m.  No results found.  General: The patient is well-developed and well-nourished and in no acute distress   Skin: Extremities are without rash or  edema.  Neurologic Exam  Mental status: The patient is alert and oriented x 3 at the time of the examination. The patient has apparent normal recent and remote memory, with an apparently normal attention span and concentration ability.   Speech is normal.  Cranial nerves: Extraocular movements are full   Color vision was symmetric.  Facial symmetry is present.  Facial strength was normal.  Trapezius strength was normal no obvious hearing deficits are noted.  Motor:  Muscle bulk is normal.   Muscle tone is increased in the legs.  Strength is 5/5 in the arms, 3 to 4 - in the hip flexors, 4+ in the leg extenders, 4 in the gastrocnemius muscles and 3 to 4 - with ankle extensors and toe extensors.  The right was slightly weaker than the left.  Sensory: Sensory testing is intact to pinprick, soft touch and vibration sensation in the arms but he has reduced vibration sensation and reduced touch sensation in the feet vibration sensation was just mildly reduced at the ankle.  Coordination: Cerebellar testing reveals good finger-nose-finger and unable to do  heel-to-shin bilaterally.  Gait and station: He can rise up from a chair using his arms to support..  With a walker, he moves by pushing up with his arms and dragging both legs, then supporting the weight with the legs while he repositions his arms and repeating  Reflexes: Deep tendon reflexes are normal in the arms and knees and absent at the  toes.   Babinski responses are extensor.    DIAGNOSTIC DATA (LABS, IMAGING, TESTING) - I reviewed patient records, labs, notes, testing and imaging myself where available.  Lab Results  Component Value Date   WBC 9.1 03/23/2020   HGB 15.5 03/23/2020   HCT 48.1 03/23/2020   MCV 90 03/23/2020   PLT 281 03/23/2020      Component Value Date/Time   NA 139 02/09/2021 0935   K 4.3 02/09/2021 0935   CL 101 02/09/2021 0935   CO2 31 02/09/2021 0935   GLUCOSE 161 (H) 02/09/2021 0935   GLUCOSE 147 (H) 05/24/2006 1022   BUN 12 02/09/2021 0935   CREATININE 1.12 02/09/2021 0935   CALCIUM 9.3 02/09/2021 0935   CALCIUM 8.7 10/31/2016 0415   PROT 7.0 07/12/2020 0830   ALBUMIN 4.1 07/12/2020 0830   AST 22 07/12/2020 0830   ALT 21 07/12/2020 0830   ALKPHOS 89 07/12/2020 0830   BILITOT 0.5 07/12/2020 0830   GFRNONAA 75 07/28/2019 0000   GFRAA 87 07/28/2019 0000   Lab Results  Component Value Date   CHOL 175 10/10/2020   HDL 48.10 10/10/2020   LDLCALC 116 (H) 10/10/2020   LDLDIRECT 65.0 02/09/2021   TRIG 52.0 10/10/2020   CHOLHDL 4 10/10/2020   Lab Results  Component Value Date   HGBA1C 7.6 (H) 02/09/2021   No results found for: VITAMINB12 Lab Results  Component Value Date   TSH 4.67 02/09/2021       ASSESSMENT AND PLAN  MULTIPLE SCLEROSIS - Plan: Ambulatory referral to Physical Therapy  Gait disturbance - Plan: Ambulatory referral to Physical Therapy  Acquired diplegia ([place])  Urinary dysfunction  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[place])  1.  Although the MS appears to be stable since doing 2 years of [place] in 2016 and 2017, he has had some progression especially with more spasticity in the legs.  His last MRI (07/27/2019) showed no new lesions.  I believe his mild worsening this year is mostly due to secondary progressive MS and unfortunately, no medications slow it down convincingly.     Check MRI around time of next visit.   2.   Continue baclofen but  go back to 5 to 6 pills a day.  I will add nighttime diazepam 5 mg.. 3.   Increase Flomax to 0.8 mg for bladder.  If no better, refer to urology to rule out structural issue and further recommendations 4.   Stay active and walk as tolerated with walker 5.   He will return to see [place] in 6 months or sooner for new or worsening neurologic symptoms.  Eriberto Felch A. Felecia Shelling, MD, [facility] 00/51/1021, 1:17 PM Certified in Neurology, Clinical Neurophysiology, Sleep Medicine and Neuroimaging  [facility] [address], [place], [place] [address] [phone]

## 2021-04-14 ENCOUNTER — Telehealth: Payer: Self-pay | Admitting: Neurology

## 2021-04-14 MED ORDER — TAMSULOSIN HCL 0.4 MG PO CAPS: 0.8000 mg | ORAL_CAPSULE | Freq: Every day | ORAL | 1 refills | Status: DC

## 2021-04-14 NOTE — Telephone Encounter (Signed)
Called the patient and he states that at the last visit that the flomax was increased to 2 daily. I have corrected the script to reflect that and will send the new script in

## 2021-04-14 NOTE — Telephone Encounter (Signed)
Pt requesting refill for tamsulosin (FLOMAX) 0.4 MG CAPS capsule. Pharmacy [id number] - [place], [place]

## 2021-05-04 ENCOUNTER — Telehealth: Payer: Self-pay | Admitting: Endocrinology

## 2021-05-04 NOTE — Telephone Encounter (Signed)
PT called needing medtronic papers signed faxed to [employer] ASAP. [phone]  Pt is asking for a confirmation that this has been completed at [phone].

## 2021-05-04 NOTE — Telephone Encounter (Signed)
[employer]tr[employer] paperwork has now been signed by provider and faxed over. Faxed to: [phone]. Fax number received from [employer]

## 2021-05-11 NOTE — Telephone Encounter (Signed)
LM that [employer] papers have been faxed to them.

## 2021-05-19 ENCOUNTER — Other Ambulatory Visit: Payer: Self-pay

## 2021-05-19 ENCOUNTER — Other Ambulatory Visit (INDEPENDENT_AMBULATORY_CARE_PROVIDER_SITE_OTHER): Payer: Medicare Other

## 2021-05-19 DIAGNOSIS — E1065 Type 1 diabetes mellitus with hyperglycemia: Secondary | ICD-10-CM

## 2021-05-19 DIAGNOSIS — E7800 Pure hypercholesterolemia, unspecified: Secondary | ICD-10-CM

## 2021-05-19 DIAGNOSIS — E039 Hypothyroidism, unspecified: Secondary | ICD-10-CM

## 2021-05-19 LAB — T4, FREE: Free T4: 0.95 ng/dL (ref 0.60–1.60)

## 2021-05-19 LAB — COMPREHENSIVE METABOLIC PANEL
ALT: 18 U/L (ref 0–53)
AST: 22 U/L (ref 0–37)
Albumin: 4.3 g/dL (ref 3.5–5.2)
Alkaline Phosphatase: 88 U/L (ref 39–117)
BUN: 16 mg/dL (ref 6–23)
CO2: 31 mEq/L (ref 19–32)
Calcium: 9 mg/dL (ref 8.4–10.5)
Chloride: 101 mEq/L (ref 96–112)
Creatinine, Ser: 1.18 mg/dL (ref 0.40–1.50)
GFR: 72.83 mL/min (ref >60.00)
Glucose, Bld: 234 mg/dL — ABNORMAL HIGH (ref 70–99)
Potassium: 4.3 mEq/L (ref 3.5–5.1)
Sodium: 138 mEq/L (ref 135–145)
Total Bilirubin: 0.8 mg/dL (ref 0.2–1.2)
Total Protein: 6.7 g/dL (ref 6.0–8.3)

## 2021-05-19 LAB — LIPID PANEL
Cholesterol: 114 mg/dL (ref 0–200)
HDL: 41.3 mg/dL (ref >39.00)
LDL Cholesterol: 63 mg/dL (ref 0–99)
NonHDL: 72.59
Total CHOL/HDL Ratio: 3
Triglycerides: 46 mg/dL (ref 0.0–149.0)
VLDL: 9.2 mg/dL (ref 0.0–40.0)

## 2021-05-19 LAB — TSH: TSH: 4.14 u[IU]/mL (ref 0.35–5.50)

## 2021-05-19 LAB — HEMOGLOBIN A1C: Hgb A1c MFr Bld: 7 % — ABNORMAL HIGH (ref 4.6–6.5)

## 2021-05-22 ENCOUNTER — Other Ambulatory Visit: Payer: Self-pay | Admitting: Endocrinology

## 2021-05-24 ENCOUNTER — Encounter: Payer: Self-pay | Admitting: Endocrinology

## 2021-05-24 ENCOUNTER — Other Ambulatory Visit: Payer: Self-pay

## 2021-05-24 ENCOUNTER — Ambulatory Visit (INDEPENDENT_AMBULATORY_CARE_PROVIDER_SITE_OTHER): Payer: Medicare Other | Admitting: Endocrinology

## 2021-05-24 VITALS — BP 104/70 | HR 84 | Ht 73.0 in | Wt 211.4 lb

## 2021-05-24 DIAGNOSIS — E1065 Type 1 diabetes mellitus with hyperglycemia: Secondary | ICD-10-CM | POA: Diagnosis not present

## 2021-05-24 DIAGNOSIS — I10 Essential (primary) hypertension: Secondary | ICD-10-CM | POA: Diagnosis not present

## 2021-05-24 DIAGNOSIS — E063 Autoimmune thyroiditis: Secondary | ICD-10-CM

## 2021-05-24 DIAGNOSIS — Z23 Encounter for immunization: Secondary | ICD-10-CM | POA: Diagnosis not present

## 2021-05-24 NOTE — Progress Notes (Addendum)
Subjective:              Patient ID: Rachelle Hora, male   DOB: 10/06/1970, 50 y.o.   MRN: [id number]    Diabetes  Diagnosis: Type 1 diabetes mellitus, date of diagnosis:  1995.   Insulin Pump followup:   CURRENT insulin pump:  Medtronic 770 G since 04/2021  HISTORY: An insulin pump has been in use since 03/13/11.   PUMP settings    Basal rate: 12 AM- 4 AM = 0.25, 4 AM- 10 PM = 1.5,10 PM = 0.25  Carbohydrate to insulin ratio: 8 AM = 1: 7, 12 noon = 1: 8.5, 10 PM = 1: 15  Sensitivity settings: 12 AM = 1: 45, 7 AM until 8 a.m. 8 AM = 1: 45  Blood sugar target 140-140    Recent history:  His A1c is better than usual at 7%   Management reviewed from [facility] sensor and pump download, current history and problems identified:   He has been started on the 770 insulin pump earlier this month and he has been in the auto mode since 05/16/2021 This was facilitated by the Medtronic diabetes educator  He has an atraumatic improvement in his blood sugar control and even with being in the manual mode the last 2 weeks time in range is now 82% with only 2% hypoglycemia  His carbohydrate coverage has been changed to 8.5 instead of 1: 7 by the diabetes educator at lunch and dinner  However he appears to have relatively lower blood sugars after dinner Blood sugars are generally stable in the morning but he has less carbohydrate intake at that time Occasionally will have high readings after lunch especially if he is eating out or getting fast food and on the holidays However he thinks that his new pump is not allowing him to give adequate boluses in the auto mode and reduces the calculated amount Occasionally however he is not taking any boluses if he has relatively small carbohydrate intake and this may sometimes cause higher readings  The guardian sensor download for the last 2 weeks is interpreted as follows   Blood sugars in the last 2 weeks are generally fairly stable although better  and less labile in the last 10 days or so  HYPERGLYCEMIC episodes have occurred 3 times in the afternoon at 3 PM, twice at 4 PM and twice at 11 AM  OVERNIGHT blood sugars are averaging about 160 at midnight and then decreasing to near 120 until about 9 AM on average Has had only 1 low normal reading at 3 AM  POSTPRANDIAL readings are mostly evenly controlled after lunch and levels are about 3 hours postprandial After SUPPERTIME blood sugars are tending to be low about 1 hour postprandial on most occasions and recovering by 3 hours, also appears to have some early rise in blood sugar before bolus is given HYPOGLYCEMIA in general is minimal with periodic low sugars around 6 PM or 8-9 PM, usually very transient Calibration done 3 times a day and blood sugars appear to be very accurate compared to fingersticks except once or twice  Time in range compared to the last visit is improved at 82% compared to 64  CGM use % of time 89  2-week average/GV 137/39  Time in range        82%  % Time Above 180 16  % Time above 250   % Time Below 70 2     PRE-MEAL Fasting Lunch Dinner Bedtime  Overall  Glucose range:       Averages: 134 144 159 162/139    POST-MEAL PC Breakfast PC Lunch PC Dinner  Glucose range:     Averages: 147 161 139     CGM use % of time 100  2-week average/GV 165/52  Time in range     64   %  % Time Above 180 28  % Time above 250 7  % Time Below 70 <1     Wt Readings from Last 3 Encounters:  05/24/21 211 lb 6.4 oz (95.9 kg)  03/27/21 208 lb 8 oz (94.6 kg)  09/27/20 208 lb (94.3 kg)     Lab Results  Component Value Date   HGBA1C 7.0 (H) 05/19/2021   HGBA1C 7.6 (H) 02/09/2021   HGBA1C 7.9 (H) 10/10/2020   Lab Results  Component Value Date   MICROALBUR <0.7 03/01/2020   LDLCALC 63 05/19/2021   CREATININE 1.18 05/19/2021     OTHER active problems discussed today: See review of systems    Allergies as of 05/24/2021       Reactions   Lisinopril Cough         Medication List        Accurate as of May 24, 2021 10:53 AM. If you have any questions, ask your nurse or doctor.          amoxicillin-clavulanate 875-125 MG tablet Commonly known as: Augmentin Take 1 tablet by mouth 2 (two) times daily.   baclofen 10 MG tablet Commonly known as: LIORESAL Take one or two pills three times a day (up to 6 pills/day)   Contour Next Test test strip Generic drug: glucose blood Use as instructed. USE AS DIRECTED 8 TIMES PER DAY. E10.65   dalfampridine 10 MG Tb12 Commonly known as: Ampyra Take 1 tablet (10 mg total) by mouth 2 (two) times daily.   Dexcom G5 Receiver Kit Devi by Does not apply route.   Dexcom G6 Sensor Misc Use as directed.   Dexcom G6 Transmitter Misc Use to monitor blood sugar   Gvoke HypoPen 2-Pack 1 MG/0.2ML Soaj Generic drug: Glucagon Inject 1 Act into the skin daily as needed.   losartan 50 MG tablet Commonly known as: COZAAR Take 1 tablet (50 mg total) by mouth daily.   NovoLOG 100 UNIT/ML injection Generic drug: insulin aspart USE UP TO 80 UNITS IN INSULIN PUMP DAILY-DX CODE E10.8   simvastatin 20 MG tablet Commonly known as: ZOCOR TAKE 1 TABLET BY MOUTH EVERYDAY AT BEDTIME   solifenacin 5 MG tablet Commonly known as: VESICARE TAKE 1 TABLET (5 MG TOTAL) BY MOUTH DAILY.   Synthroid 125 MCG tablet Generic drug: levothyroxine TAKE 1 TABLET BY MOUTH 6 DAYS PER WEEK   tamsulosin 0.4 MG Caps capsule Commonly known as: FLOMAX TAKE 1 CAPSULE BY MOUTH EVERY DAY   tamsulosin 0.4 MG Caps capsule Commonly known as: FLOMAX Take 2 capsules (0.8 mg total) by mouth daily.        Allergies:  Allergies  Allergen Reactions   Lisinopril Cough    Past Medical History:  Diagnosis Date   Hyperlipidemia    Hypertension    Hypogonadism in male 11/01/2016   Hypothyroidism    Multiple sclerosis ([place])    Proliferative diabetic retinopathy(362.02)    Type 1 diabetes mellitus ([place]) dx'd 1994   Vitamin  D insufficiency 11/29/2016    Past Surgical History:  Procedure Laterality Date   EYE SURGERY Bilateral    "laser for diabetic  retinopathy"   FRACTURE SURGERY     IR VENO/EXT/UNI LEFT  03/24/2020   OPEN REDUCTION INTERNAL FIXATION (ORIF) TIBIA/FIBULA FRACTURE Right 10/30/2016   ORIF ANKLE FRACTURE Left 2015   ORIF TIBIA FRACTURE Right 10/30/2016   Procedure: OPEN REDUCTION INTERNAL FIXATION (ORIF) TIBIA FIBULA FRACTURE;  Surgeon: Altamese [place], MD;  Location: [facility];  Service: Orthopedics;  Laterality: Right;   RETINAL LASER PROCEDURE Bilateral    "for diabetic retinopathy"    Family History  Problem Relation Age of Onset   Diabetes Sister    Colon cancer Maternal Grandmother    Colon polyps Maternal Uncle     Social History:  reports that he has never smoked. He has never used smokeless tobacco. He reports that he does not drink alcohol and does not use drugs.    Review of Systems      HYPERCHOLESTEROLEMIA: He has been taking simvastatin 20 mg daily   His liver function consistently normal  Lab Results  Component Value Date   CHOL 114 05/19/2021   HDL 41.30 05/19/2021   LDLCALC 63 05/19/2021   LDLDIRECT 65.0 02/09/2021   TRIG 46.0 05/19/2021   CHOLHDL 3 05/19/2021   Lab Results  Component Value Date   ALT 18 05/19/2021    HYPERTENSION: He had been on lisinopril with good control  of blood pressure  He is on losartan 50 mg daily    BP Readings from Last 3 Encounters:  05/24/21 104/70  03/27/21 123/67  01/31/21 137/72    He has MULTIPLE sclerosis, is followed regularly by neurologist  HYPOTHYROIDISM: He has had longstanding primary 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 autoimmune  His TSH previously had been frequently variable and not clear why it fluctuates, more recently been requiring lower doses He also does not feel any different whether his thyroid level is high or low He generally has a tendency of feeling sleepy during the day  His dose has been 125 mcg, 6 1/2  days a week  He is able to get brand-name Synthroid and is using 90-day prescriptions  He is regular with taking his supplement before breakfast daily TSH history as follows:   Lab Results  Component Value Date   TSH 4.14 05/19/2021   TSH 4.67 02/09/2021   TSH 1.05 10/10/2020   FREET4 0.95 05/19/2021   FREET4 1.06 07/12/2020   FREET4 0.75 03/14/2020        Objective:   Physical Exam  BP 104/70    Pulse 84    Ht 6' 1" (1.854 m)    Wt 211 lb 6.4 oz (95.9 kg)    BMI 27.89 kg/m            Assessment:      DIABETES type I on insulin pump:   See history of present illness for detailed discussion of current diabetes management, blood sugar patterns and problems identified  A1c is 7 now and better than usual  With using the Medtronic 770 pump and going in the auto mode his blood sugars are markedly improved with less variability Also with his tendency to previously disconnecting his pump overnight he had low sugars but now he is allowing the auto mode to regulate his basal rates Hypoglycemia has been minimal with only 2% readings below 70  He has had some adjustments in his settings done by nurse educator last week However still has a tendency to excessive bolus action with his dinner bolus using 1: 8.5 carbohydrate ratio  Also he thinks that he is not  getting enough correction for high readings and the pump is usually reducing his calculated bolus even with his active insulin duration of 3 hours   He has better time in range compared to last visit and less variability The improvement may be related to less consistent hyperglycemia and also less fluctuation from high and low swings overnight   However he still has enough tendency to low sugars overnight to indicate need for much lower basal rates  Even though he was supposed to have a 0.7 basal rate overnight he is still using 1.0 basal rate between midnight-4 AM  Not clear if he knows how to change his basal rates now   Also did not check on the GUARDIAN sensor as recommended which would prevent him from getting hypoglycemia   As before he is afraid to bolus enough for his meals and is frequently not bolusing enough for lunch and sometimes supper and also skipping boluses frequently at breakfast time  Overall hypoglycemia has been minimal also  HYPOTHYROIDISM:  TSH normal and he is likely asymptomatic with a TSH of about 4    Lipids: Well-controlled with LDL 63   HYPERTENSION:   Controlled on losartan 50 mg    Plan:       He needs to do his boluses a few minutes before eating especially if blood sugar is high Discussed that he needs to bolus regardless of [place] blood sugar Also needs to cover all carbohydrates with meals and snacks with the bolus at the time of eating  Continue NovoLog brand  Carbohydrate coverage at dinnertime will be 1: 12 Correction factor will be 1: 35 instead of 45   Synthroid dosage will be continued at 6-1/2 tablets a week of the 125 mcg  Follow-up in 3 months   There are no Patient Instructions on file for this visit.  Flu vaccine given  Total visit time including counseling = 30 minutes  Aliviyah Malanga  Note: This office note was prepared with Estate agent. Any transcriptional errors that result from this process are unintentional.

## 2021-06-15 ENCOUNTER — Other Ambulatory Visit: Payer: Self-pay | Admitting: Internal Medicine

## 2021-06-15 DIAGNOSIS — N3281 Overactive bladder: Secondary | ICD-10-CM

## 2021-06-21 ENCOUNTER — Other Ambulatory Visit: Payer: Self-pay | Admitting: Endocrinology

## 2021-07-07 ENCOUNTER — Other Ambulatory Visit: Payer: Self-pay | Admitting: Endocrinology

## 2021-07-20 ENCOUNTER — Other Ambulatory Visit: Payer: Self-pay

## 2021-07-20 ENCOUNTER — Telehealth (INDEPENDENT_AMBULATORY_CARE_PROVIDER_SITE_OTHER): Payer: Medicare Other | Admitting: Family Medicine

## 2021-07-20 ENCOUNTER — Encounter: Payer: Self-pay | Admitting: Family Medicine

## 2021-07-20 VITALS — Temp 97.3°F | Ht 73.0 in | Wt 211.4 lb

## 2021-07-20 DIAGNOSIS — U071 COVID-19: Secondary | ICD-10-CM

## 2021-07-20 MED ORDER — NIRMATRELVIR/RITONAVIR (PAXLOVID)TABLET: 3.0000 | ORAL_TABLET | Freq: Two times a day (BID) | ORAL | 0 refills | Status: AC

## 2021-07-20 NOTE — Progress Notes (Signed)
Virtual Visit via Video Note ° °I connected with Johnathan Ross ° on 07/20/21 at 11:40 AM EST by a video enabled telemedicine application and verified that I am speaking with the correct person using two identifiers. ° Location patient: [name] °Location provider:work or home office °Persons participating in the virtual visit: patient, provider ° °I discussed the limitations and requested verbal permission for telemedicine visit. The patient expressed understanding and agreed to proceed. ° ° °HPI: ° °Acute telemedicine visit for Covid19: °-Onset: 2 days ago; tested positive for covid today °-Symptoms include: sone diarrhea, some body aches, cough, sore throat, nasal congestion °-Denies: fevers, CP, SOB, vomiting °-drinking fluids °-Pertinent past medical history: see below, has had covid in the past about 1 year ago °-GFR was 72 in december °-Pertinent medication allergies: °Allergies  °Allergen Reactions  ° Lisinopril Cough  °-COVID-19 vaccine status: 2 doses of covid vaccine °Immunization History  °Administered Date(s) Administered  ° Influenza,inj,Quad PF,6+ Mos 06/22/2013, 04/14/2014, 02/01/2015, 02/15/2016, 03/21/2017, 04/02/2018, 02/11/2019, 03/01/2020, 05/24/2021  ° Pneumococcal Polysaccharide-23 03/01/2020  ° ° ° °ROS: See pertinent positives and negatives per HPI. ° °Past Medical History:  °Diagnosis Date  ° Hyperlipidemia   ° Hypertension   ° Hypogonadism in male 11/01/2016  ° Hypothyroidism   ° Multiple sclerosis (HCC)   ° Proliferative diabetic retinopathy(362.02)   ° Type 1 diabetes mellitus (HCC) dx'd 1994  ° Vitamin D insufficiency 11/29/2016  ° ° °Past Surgical History:  °Procedure Laterality Date  ° EYE SURGERY Bilateral   ° "laser for diabetic retinopathy"  ° FRACTURE SURGERY    ° IR VENO/EXT/UNI LEFT  03/24/2020  ° OPEN REDUCTION INTERNAL FIXATION (ORIF) TIBIA/FIBULA FRACTURE Right 10/30/2016  ° ORIF ANKLE FRACTURE Left 2015  ° ORIF TIBIA FRACTURE Right 10/30/2016  ° Procedure: OPEN REDUCTION INTERNAL FIXATION  (ORIF) TIBIA FIBULA FRACTURE;  Surgeon: Handy, Michael, MD;  Location: [facility] OR;  Service: Orthopedics;  Laterality: Right;  ° RETINAL LASER PROCEDURE Bilateral   ° "for diabetic retinopathy"  ° ° ° °Current Outpatient Medications:  °  amoxicillin-clavulanate (AUGMENTIN) 875-125 MG tablet, Take 1 tablet by mouth 2 (two) times daily., Disp: 20 tablet, Rfl: 0 °  baclofen (LIORESAL) 10 MG tablet, Take one or two pills three times a day (up to 6 pills/day), Disp: 180 each, Rfl: 11 °  Continuous Blood Gluc Receiver (DEXCOM G5 RECEIVER KIT) DEVI, by Does not apply route., Disp: , Rfl:  °  Continuous Blood Gluc Sensor (DEXCOM G6 SENSOR) MISC, Use as directed., Disp: 12 each, Rfl: 1 °  Continuous Blood Gluc Transmit (DEXCOM G6 TRANSMITTER) MISC, Use to monitor blood sugar, Disp: 1 each, Rfl: 0 °  dalfampridine (AMPYRA) 10 MG TB12, Take 1 tablet (10 mg total) by mouth 2 (two) times daily., Disp: 180 tablet, Rfl: 3 °  Glucagon (GVOKE HYPOPEN 2-PACK) 1 MG/0.2ML SOAJ, Inject 1 Act into the skin daily as needed., Disp: 2 mL, Rfl: 5 °  glucose blood (CONTOUR NEXT TEST) test strip, Use as instructed. USE AS DIRECTED 8 TIMES PER DAY. E10.65, Disp: 300 strip, Rfl: 3 °  losartan (COZAAR) 50 MG tablet, TAKE 1 TABLET BY MOUTH EVERY DAY, Disp: 90 tablet, Rfl: 3 °  nirmatrelvir/ritonavir EUA (PAXLOVID) 20 x 150 MG & 10 x 100MG TABS, Take 3 tablets by mouth 2 (two) times daily for 5 days. (Take nirmatrelvir 150 mg two tablets twice daily for 5 days and ritonavir 100 mg one tablet twice daily for 5 days) Patient GFR is > 60, Disp: 30 tablet, Rfl: 0 °    NOVOLOG 100 UNIT/ML injection, USE UP TO 80 UNITS IN INSULIN PUMP DAILY-DX CODE E10.8, Disp: 70 mL, Rfl: 0 °  simvastatin (ZOCOR) 20 MG tablet, TAKE 1 TABLET BY MOUTH EVERYDAY AT BEDTIME, Disp: 90 tablet, Rfl: 3 °  solifenacin (VESICARE) 5 MG tablet, TAKE 1 TABLET (5 MG TOTAL) BY MOUTH DAILY., Disp: 90 tablet, Rfl: 0 °  SYNTHROID 125 MCG tablet, TAKE 1 TABLET BY MOUTH 6 DAYS PER WEEK, Disp: 72  tablet, Rfl: 1 °Doesn't take his Ampyra when sick  °Reports not taking flomax °Agrees to hold his simvastatin for the Paxlovid x 8 days °EXAM: ° °VITALS per patient if applicable: ° °GENERAL: alert, oriented, appears well and in no acute distress ° °HEENT: atraumatic, conjunttiva clear, no obvious abnormalities on inspection of external nose and ears ° °NECK: normal movements of the head and neck ° °LUNGS: on inspection no signs of respiratory distress, breathing rate appears normal, no obvious gross SOB, gasping or wheezing ° °CV: no obvious cyanosis ° °MS: moves all visible extremities without noticeable abnormality ° °PSYCH/NEURO: pleasant and cooperative, no obvious depression or anxiety, speech and thought processing grossly intact ° °ASSESSMENT AND PLAN: ° °Discussed the following assessment and plan: ° °COVID-19 ° ° Discussed treatment options and risk of drug interactions, ideal treatment window, potential complications, isolation and precautions for COVID-19.  Checked for/reviewed any labs done in the last 90 days with GFR listed in HPI if available. After lengthy discussion, the patient opted for treatment with Paxlovid due to being higher risk for complications of covid or severe disease and other factors. Discussed EUA status of this drug and the fact that there is preliminary limited knowledge of risks/interactions/side effects per EUA document vs possible benefits and precautions. This information was shared with patient during the visit and also was provided in patient instructions. See above unde meds - all reviewed and he will hold his statin per recommendations.  Other symptomatic care measures summarized in patient instructions. °Work/School slipped offered: declined. °Advised to seek prompt virtual visit or in person care if worsening, new symptoms arise, or if is not improving with treatment as expected per our conversation of expected course. Discussed options for follow up care. Did let this  patient know that I do telemedicine on Tuesdays and Thursdays for [facility] and those are the days I am logged into the system. Advised to schedule follow up visit with PCP, [place] virtual visits or [facility] if any further questions or concerns to avoid delays in care. °  °I discussed the assessment and treatment plan with the patient. The patient was provided an opportunity to ask questions and all were answered. The patient agreed with the plan and demonstrated an understanding of the instructions. °  ° ° °Hannah R Kim, DO  ° °

## 2021-07-20 NOTE — Patient Instructions (Signed)
HOME CARE TIPS:   -I sent the medication(s) we discussed to your pharmacy: Meds ordered this encounter  Medications   nirmatrelvir/ritonavir EUA (PAXLOVID) 20 x 150 MG & 10 x 100MG TABS    Sig: Take 3 tablets by mouth 2 (two) times daily for 5 days. (Take nirmatrelvir 150 mg two tablets twice daily for 5 days and ritonavir 100 mg one tablet twice daily for 5 days) Patient GFR is > 60    Dispense:  30 tablet    Refill:  0     -I sent in the [facility] treatment or referral you requested per our discussion. Please see the information provided below and discuss further with the pharmacist/treatment team.  -If taking Paxlovid, please review all medications, supplement and over the counter drugs with your pharmacist and ask them to check for any interactions. Please make the following changes to your regular medications while taking Paxlovid: *HOLD you simvastatin during treatment and restart 3 days after finishing Paxlovid  -there is a chance of rebound illness after finishing your treatment. If you become sick again please isolate for an additional 5 days, plus 5 more days of masking.   -can use nasal saline a few times per day if you have nasal congestion -stay hydrated, drink plenty of fluids and eat small healthy meals - avoid dairy  -follow up with your doctor in 2-3 days unless improving and feeling better  -stay home while sick, except to seek medical care. If you have COVID19, you will likely be contagious for 7-10 days. Flu or Influenza is likely contagious for about 7 days. Other respiratory viral infections remain contagious for 5-10+ days depending on the virus and many other factors. Wear a good mask that fits snugly (such as N95 or KN95) if around others to reduce the risk of transmission.  It was nice to meet you today, and I really hope you are feeling better soon. I help [facility] out with telemedicine visits on Tuesdays and Thursdays and am happy to help if you need a follow up  virtual visit on those days. Otherwise, if you have any concerns or questions following this visit please schedule a follow up visit with your Primary Care doctor or seek care at a local urgent care clinic to avoid delays in care.    Seek in person care or schedule a follow up video visit promptly if your symptoms worsen, new concerns arise or you are not improving with treatment. Call 911 and/or seek emergency care if your symptoms are severe or life threatening.  FACT SHEET FOR PATIENTS, PARENTS, AND CAREGIVERS EMERGENCY USE AUTHORIZATION (EUA) OF PAXLOVID FOR CORONAVIRUS DISEASE 2019 (COVID-19) You are being given this Fact Sheet because your healthcare provider believes it is necessary to provide you with PAXLOVID for the treatment of mild-to-moderate coronavirus disease (COVID-19) caused by the SARS-CoV-2 virus. This Fact Sheet contains information to help you understand the risks and benefits of taking the PAXLOVID you have received or may receive. The U.S. Food and Drug Administration (FDA) has issued an Emergency Use Authorization (EUA) to make PAXLOVID available during the COVID-19 pandemic (for more details about an EUA please see What is an Emergency Use Authorization? at the end of this document). PAXLOVID is not an FDA-approved medicine in the [place]. Read this Fact Sheet for information about PAXLOVID. Talk to your healthcare provider about your options or if you have any questions. It is your choice to take PAXLOVID.  What is COVID-19? COVID-19 is caused  by a virus called a coronavirus. You can get COVID-19 through close contact with another person who has the virus. COVID-19 illnesses have ranged from very mild-to-severe, including illness resulting in death. While information so far suggests that most COVID-19 illness is mild, serious illness can happen and may cause some of your other medical conditions to become worse. Older people and people of all ages with  severe, long lasting (chronic) medical conditions like heart disease, lung disease, and diabetes, for example seem to be at higher risk of being hospitalized for COVID-19.  What is PAXLOVID? PAXLOVID is an investigational medicine used to treat mild-to-moderate COVID-19 in adults and children [68 years of age and older weighing at least 25 pounds (60 kg)] with positive results of direct SARS-CoV-2 viral testing, and who are at high risk for progression to severe COVID-19, including hospitalization or death. PAXLOVID is investigational because it is still being studied. There is limited information about the safety and effectiveness of using PAXLOVID to treat people with mild-to-moderate COVID-19.  The FDA has authorized the emergency use of PAXLOVID for the treatment of mild-tomoderate COVID-19 in adults and children [80 years of age and older weighing at least 28 pounds (4 kg)] with a positive test for the virus that causes COVID-19, and who are at high risk for progression to severe COVID-19, including hospitalization or death, under an EUA. 1 Revised: 12 August 2020   What should I tell my healthcare provider before I take PAXLOVID? Tell your healthcare provider if you: ? Have any allergies ? Have liver or kidney disease ? Are pregnant or plan to become pregnant ? Are breastfeeding a child ? Have any serious illnesses  Tell your healthcare provider about all the medicines you take, including prescription and over-the-counter medicines, vitamins, and herbal supplements. Some medicines may interact with PAXLOVID and may cause serious side effects. Keep a list of your medicines to show your healthcare provider and pharmacist when you get a new medicine.  You can ask your healthcare provider or pharmacist for a list of medicines that interact with PAXLOVID. Do not start taking a new medicine without telling your healthcare provider. Your healthcare provider can tell you if it is  safe to take PAXLOVID with other medicines.  Tell your healthcare provider if you are taking combined hormonal contraceptive. PAXLOVID may affect how your birth control pills work. Females who are able to become pregnant should use another effective alternative form of contraception or an additional barrier method of contraception. Talk to your healthcare provider if you have any questions about contraceptive methods that might be right for you.  How do I take PAXLOVID? ? PAXLOVID consists of 2 medicines: nirmatrelvir and ritonavir. o Take 2 pink tablets of nirmatrelvir with 1 white tablet of ritonavir by mouth 2 times each day (in the morning and in the evening) for 5 days. For each dose, take all 3 tablets at the same time. o If you have kidney disease, talk to your healthcare provider. You may need a different dose. ? Swallow the tablets whole. Do not chew, break, or crush the tablets. ? Take PAXLOVID with or without food. ? Do not stop taking PAXLOVID without talking to your healthcare provider, even if you feel better. ? If you miss a dose of PAXLOVID within 8 hours of the time it is usually taken, take it as soon as you remember. If you miss a dose by more than 8 hours, skip the missed dose and take the next  dose at your regular time. Do not take 2 doses of PAXLOVID at the same time. ? If you take too much PAXLOVID, call your healthcare provider or go to the nearest hospital emergency room right away. ? If you are taking a ritonavir- or cobicistat-containing medicine to treat hepatitis C or Human Immunodeficiency Virus (HIV), you should continue to take your medicine as prescribed by your healthcare provider. 2 Revised: 12 August 2020    Talk to your healthcare provider if you do not feel better or if you feel worse after 5 days.  Who should generally not take PAXLOVID? Do not take PAXLOVID if: ? You are allergic to nirmatrelvir, ritonavir, or any of the ingredients in  PAXLOVID. ? You are taking any of the following medicines: o Alfuzosin o Pethidine, propoxyphene o Ranolazine o Amiodarone, dronedarone, flecainide, propafenone, quinidine o Colchicine o Lurasidone, pimozide, clozapine o Dihydroergotamine, ergotamine, methylergonovine o Lovastatin, simvastatin o Sildenafil (Revatio) for pulmonary arterial hypertension (PAH) o Triazolam, oral midazolam o Apalutamide o Carbamazepine, phenobarbital, phenytoin o Rifampin o St. Johns Wort (hypericum perforatum) Taking PAXLOVID with these medicines may cause serious or life-threatening side effects or affect how PAXLOVID works.  These are not the only medicines that may cause serious side effects if taken with PAXLOVID. PAXLOVID may increase or decrease the levels of multiple other medicines. It is very important to tell your healthcare provider about all of the medicines you are taking because additional laboratory tests or changes in the dose of your other medicines may be necessary while you are taking PAXLOVID. Your healthcare provider may also tell you about specific symptoms to watch out for that may indicate that you need to stop or decrease the dose of some of your other medicines.  What are the important possible side effects of PAXLOVID? Possible side effects of PAXLOVID are: ? Allergic Reactions. Allergic reactions can happen in people taking PAXLOVID, even after only 1 dose. Stop taking PAXLOVID and call your healthcare provider right away if you get any of the following symptoms of an allergic reaction: o hives o trouble swallowing or breathing o swelling of the mouth, lips, or face o throat tightness o hoarseness 3 Revised: 12 August 2020  o skin rash ? Liver Problems. Tell your healthcare provider right away if you have any of these signs and symptoms of liver problems: loss of appetite, yellowing of your skin and the whites of eyes (jaundice), dark-colored urine, pale colored  stools and itchy skin, stomach area (abdominal) pain. ? Resistance to HIV Medicines. If you have untreated HIV infection, PAXLOVID may lead to some HIV medicines not working as well in the future. ? Other possible side effects include: o altered sense of taste o diarrhea o high blood pressure o muscle aches These are not all the possible side effects of PAXLOVID. Not many people have taken PAXLOVID. Serious and unexpected side effects may happen. PAXLOVID is still being studied, so it is possible that all of the risks are not known at this time.  What other treatment choices are there? Veklury (remdesivir) is FDA-approved for the treatment of mild-to-moderate [id number]-73 in certain adults and children. Talk with your doctor to see if Marijean Heath is appropriate for you. Like PAXLOVID, FDA may also allow for the emergency use of other medicines to treat people with COVID-19. Go to https://price.info/ for information on the emergency use of other medicines that are authorized by FDA to treat people with COVID-19. Your healthcare provider may talk with you about clinical  trials for which you may be eligible. It is your choice to be treated or not to be treated with PAXLOVID. Should you decide not to receive it or for your child not to receive it, it will not change your standard medical care.  What if I am pregnant or breastfeeding? There is no experience treating pregnant women or breastfeeding mothers with PAXLOVID. For a mother and unborn baby, the benefit of taking PAXLOVID may be greater than the risk from the treatment. If you are pregnant, discuss your options and specific situation with your healthcare provider. It is recommended that you use effective barrier contraception or do not have sexual activity while taking PAXLOVID. If you are breastfeeding, discuss your options and  specific situation with your healthcare provider. 4 Revised: 12 August 2020   How do I report side effects with PAXLOVID? Contact your healthcare provider if you have any side effects that bother you or do not go away. Report side effects to FDA MedWatch at SmoothHits.hu or call 1-800-FDA1088 or you can report side effects to [employer]. at the contact information provided below. Website Fax number Telephone number www.pfizersafetyreporting.com [phone] How should I store [place]? Store PAXLOVID tablets at room temperature, between 68?F to 77?F (20?C to 25?C). How can I learn more about COVID-19? ? Ask your healthcare provider. ? Visit https://jacobson-johnson.com/. ? Contact your local or state public health department. What is an Emergency Use Authorization (EUA)? The [place] FDA has made PAXLOVID available under an emergency access mechanism called an Emergency Use Authorization (EUA). The EUA is supported by a Education officer, museum and Human Service (HHS) declaration that circumstances exist to justify the emergency use of drugs and biological products during the COVID-19 pandemic. PAXLOVID for the treatment of mild-to-moderate COVID-19 in adults and children [46 years of age and older weighing at least 85 pounds (2 kg)] with positive results of direct SARS-CoV-2 viral testing, and who are at high risk for progression to severe COVID-19, including hospitalization or death, has not undergone the same type of review as an FDA-approved product. In issuing an EUA under the FWYOV-78 public health emergency, the FDA has determined, among other things, that based on the total amount of scientific evidence available including data from adequate and well-controlled clinical trials, if available, it is reasonable to believe that the product may be effective for diagnosing, treating, or preventing COVID-19, or a serious or life-threatening disease or condition  caused by COVID-19; that the known and potential benefits of the product, when used to diagnose, treat, or prevent such disease or condition, outweigh the known and potential risks of such product; and that there are no adequate, approved, and available alternatives. All of these criteria must be met to allow for the product to be used in the treatment of patients during the COVID-19 pandemic. The EUA for PAXLOVID is in effect for the duration of the COVID-19 declaration justifying emergency use of this product, unless terminated or revoked (after which the products may no longer be used under the EUA). 5 Revised: 12 August 2020     Additional Information For general questions, visit the website or call the telephone number provided below. Website Telephone number www.COVID19oralRx.com [phone] (1-877-C19-PACK) You can also go to www.pfizermedinfo.com or call [phone] for more information. [id number] Revised: 12 August 2020

## 2021-08-01 ENCOUNTER — Encounter: Payer: Self-pay | Admitting: Internal Medicine

## 2021-08-01 ENCOUNTER — Other Ambulatory Visit: Payer: Self-pay

## 2021-08-01 ENCOUNTER — Ambulatory Visit (INDEPENDENT_AMBULATORY_CARE_PROVIDER_SITE_OTHER): Payer: Medicare Other | Admitting: Internal Medicine

## 2021-08-01 VITALS — BP 118/76 | HR 86 | Temp 98.7°F | Ht 73.0 in | Wt 204.3 lb

## 2021-08-01 DIAGNOSIS — Z1211 Encounter for screening for malignant neoplasm of colon: Secondary | ICD-10-CM | POA: Diagnosis not present

## 2021-08-01 DIAGNOSIS — Z125 Encounter for screening for malignant neoplasm of prostate: Secondary | ICD-10-CM | POA: Insufficient documentation

## 2021-08-01 NOTE — Assessment & Plan Note (Signed)
Referral placed to GI for colonoscopy after discussion about options with patient and mother.  ?

## 2021-08-01 NOTE — Patient Instructions (Signed)
We will have them contact you about colon cancer screening. ?

## 2021-08-01 NOTE — Progress Notes (Signed)
? ?  Subjective:  ? ?Patient ID: Johnathan Ross, male    DOB: 1971/02/09, 51 y.o.   MRN: [id number] ? ?HPI ?The patient is a 51 YO man desires colon cancer screening options. ? ?Review of Systems  ?Constitutional: Negative.   ?Respiratory:  Negative for cough, chest tightness and shortness of breath.   ?Cardiovascular:  Negative for chest pain, palpitations and leg swelling.  ?Gastrointestinal:  Negative for abdominal distention, abdominal pain, constipation, diarrhea, nausea and vomiting.  ? ?Objective:  ?Physical Exam ?Constitutional:   ?   Appearance: He is well-developed.  ?HENT:  ?   Head: Normocephalic and atraumatic.  ?Cardiovascular:  ?   Rate and Rhythm: Normal rate and regular rhythm.  ?Pulmonary:  ?   Effort: Pulmonary effort is normal. No respiratory distress.  ?   Breath sounds: Normal breath sounds. No wheezing or rales.  ?Abdominal:  ?   General: Bowel sounds are normal. There is no distension.  ?   Palpations: Abdomen is soft.  ?   Tenderness: There is no abdominal tenderness. There is no rebound.  ?Musculoskeletal:  ?   Cervical back: Normal range of motion.  ?Skin: ?   General: Skin is warm and dry.  ?Neurological:  ?   Mental Status: He is alert and oriented to person, place, and time.  ?   Coordination: Coordination abnormal.  ?   Comments: wheelchair  ? ? ?Vitals:  ? 08/01/21 0842  ?BP: 118/76  ?Pulse: 86  ?Temp: 98.7 ?F (37.1 ?C)  ?TempSrc: Oral  ?SpO2: 96%  ?Weight: 204 lb 5 oz (92.7 kg)  ?Height: '6\' 1"'$  (1.854 m)  ? ? ?This visit occurred during the SARS-CoV-2 public health emergency.  Safety protocols were in place, including screening questions prior to the visit, additional usage of staff PPE, and extensive cleaning of exam room while observing appropriate contact time as indicated for disinfecting solutions.  ? ?Assessment & Plan:  ?Visit time 15 minutes in face to face communication with patient and coordination of care, additional 5 minutes spent in record review, coordination or care,  ordering tests, communicating/referring to other healthcare professionals, documenting in medical records all on the same day of the visit for total time 20 minutes spent on the visit.  ? ?

## 2021-08-02 ENCOUNTER — Other Ambulatory Visit: Payer: Self-pay | Admitting: Endocrinology

## 2021-08-08 ENCOUNTER — Telehealth: Payer: Self-pay | Admitting: Neurology

## 2021-08-08 DIAGNOSIS — G35 Multiple sclerosis: Secondary | ICD-10-CM

## 2021-08-08 DIAGNOSIS — R269 Unspecified abnormalities of gait and mobility: Secondary | ICD-10-CM

## 2021-08-08 NOTE — Telephone Encounter (Signed)
New referral placed. I called pt to let him know. He verbalized understanding. ?

## 2021-08-08 NOTE — Telephone Encounter (Signed)
Pt states [facility] has advised him a new order needs to be sent over from Dr Felecia Shelling in order for him to be seen there, please call. ?

## 2021-08-14 ENCOUNTER — Ambulatory Visit: Payer: Medicare Other | Attending: Neurology | Admitting: Physical Therapy

## 2021-08-14 ENCOUNTER — Encounter: Payer: Self-pay | Admitting: Physical Therapy

## 2021-08-14 ENCOUNTER — Other Ambulatory Visit: Payer: Self-pay

## 2021-08-14 DIAGNOSIS — R2689 Other abnormalities of gait and mobility: Secondary | ICD-10-CM | POA: Diagnosis not present

## 2021-08-14 DIAGNOSIS — M21371 Foot drop, right foot: Secondary | ICD-10-CM | POA: Insufficient documentation

## 2021-08-14 DIAGNOSIS — M21372 Foot drop, left foot: Secondary | ICD-10-CM | POA: Insufficient documentation

## 2021-08-14 DIAGNOSIS — R269 Unspecified abnormalities of gait and mobility: Secondary | ICD-10-CM | POA: Insufficient documentation

## 2021-08-14 DIAGNOSIS — M6281 Muscle weakness (generalized): Secondary | ICD-10-CM | POA: Insufficient documentation

## 2021-08-14 DIAGNOSIS — R2681 Unsteadiness on feet: Secondary | ICD-10-CM | POA: Diagnosis not present

## 2021-08-14 DIAGNOSIS — G35 Multiple sclerosis: Secondary | ICD-10-CM | POA: Diagnosis not present

## 2021-08-14 DIAGNOSIS — R296 Repeated falls: Secondary | ICD-10-CM | POA: Diagnosis not present

## 2021-08-14 NOTE — Therapy (Signed)
?OUTPATIENT PHYSICAL THERAPY NEURO EVALUATION ? ? ?Patient Name: Johnathan Ross ?MRN: [id number] ?DOB:12-13-1970, 51 y.o., male ?Today's Date: 08/14/2021 ? ?PCP: Janith Lima, MD ?REFERRING PROVIDER: Britt Bottom, MD ? ? PT End of Session - 08/14/21 1016   ? ? Visit Number 1   ? Number of Visits 17   16+eval  ? Date for PT Re-Evaluation 10/13/21   ? Authorization Type Medicare A&B  Patient pays 20% of visit cost  Follow medicare guidelines  VL: [place]   ? Progress Note Due on Visit 10   ? PT Start Time 1015   ? PT Stop Time 1105   ? PT Time Calculation (min) 50 min   ? Equipment Utilized During Treatment Gait belt   ? Activity Tolerance Patient tolerated treatment well   ? Behavior During Therapy [facility] for tasks assessed/performed   ? ?  ?  ? ?  ? ? ?Past Medical History:  ?Diagnosis Date  ? Hyperlipidemia   ? Hypertension   ? Hypogonadism in male 11/01/2016  ? Hypothyroidism   ? Multiple sclerosis ([place])   ? Proliferative diabetic retinopathy(362.02)   ? Type 1 diabetes mellitus ([place]) dx'd 1994  ? Vitamin D insufficiency 11/29/2016  ? ?Past Surgical History:  ?Procedure Laterality Date  ? EYE SURGERY Bilateral   ? "laser for diabetic retinopathy"  ? FRACTURE SURGERY    ? IR VENO/EXT/UNI LEFT  03/24/2020  ? OPEN REDUCTION INTERNAL FIXATION (ORIF) TIBIA/FIBULA FRACTURE Right 10/30/2016  ? ORIF ANKLE FRACTURE Left 2015  ? ORIF TIBIA FRACTURE Right 10/30/2016  ? Procedure: OPEN REDUCTION INTERNAL FIXATION (ORIF) TIBIA FIBULA FRACTURE;  Surgeon: Altamese [place], MD;  Location: [facility] [place];  Service: Orthopedics;  Laterality: Right;  ? RETINAL LASER PROCEDURE Bilateral   ? "for diabetic retinopathy"  ? ?Patient Active Problem List  ? Diagnosis Date Noted  ? Colon cancer screening 08/01/2021  ? Clinical diagnosis of COVID-19 12/28/2020  ? OAB (overactive bladder) 09/21/2020  ? Conjunctival injection, right 09/21/2020  ? Essential hypertension 09/21/2020  ? Urinary dysfunction 09/21/2020  ? Thoracic outlet syndrome 05/03/2020  ?  Controlled type 1 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 ([place]) 03/24/2020  ? Right epiretinal membrane 03/24/2020  ? Retinal exudates and deposits 03/24/2020  ? 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 Nuclear sclerotic cataract of both eyes 03/24/2020  ? Gait disorder 03/23/2020  ? Benign prostatic hyperplasia without lower urinary tract symptoms 03/01/2020  ? Diabetic gastroparesis associated with type 1 diabetes mellitus ([place]) 03/01/2020  ? Uncontrolled type 1 diabetes mellitus with hyperglycemia ([place]) 03/01/2020  ? Gait disturbance 08/19/2019  ? Acquired diplegia ([place]) 08/19/2019  ? Vitamin D insufficiency 11/29/2016  ? Hypogonadism in male 11/01/2016  ? 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HCC)   ? Hypothyroidism   ? Hypertension   ? Abnormal liver function tests 03/06/2013  ? Type I (juvenile type) diabetes mellitus without mention of complication, uncontrolled 12/03/2007  ? HYPERCHOLESTEROLEMIA 11/12/2007  ? MULTIPLE SCLEROSIS 08/29/2007  ? PROLIFERATIVE DIABETIC RETINOPATHY 08/29/2007  ? ? ?ONSET DATE: 08/08/2021  ? ?REFERRING DIAG: G35 (ICD-10-CM) - Multiple sclerosis (HCC) R26.9 (ICD-10-CM) - Gait disturbance  ? ?THERAPY DIAG:  ?Other abnormalities of gait and mobility ? ?Muscle weakness (generalized) ? ?Unsteadiness on feet ? ?Repeated falls ? ?SUBJECTIVE:  ?                                                                                                                                                                                           ? ?  SUBJECTIVE STATEMENT: ?He feels his knees are locking out into hyperextension worse than before, R>L.  He is doing exercises from last PT visit.  He is fatiguing from the hyperextending quicker than usual.  He does not want a brace for the LLE and he does not feel his baclofen is helping, but continues to take it because he is scared not to.  He does not feel tone/spasticity has become worse. ?Pt accompanied by:  self ? ?PERTINENT HISTORY: vitamin D insufficiency, Type 1 IDDM w/ polyneuropathy, diabetic retinopathy, hypothyroidism, HTN, hypercholestrolemia, R tibia fracture, L ankle fracture with ORIF, MS with acquired diplegia  ? ?PAIN:  ?Are you having pain? No ? ?PRECAUTIONS: Fall ? ?WEIGHT BEARING RESTRICTIONS No ? ?FALLS: Has patient fallen in last 6 months? Yes, Number of falls: yesterday; 1 fall ?He states he often does not stop what he is doing as he wants to go ahead and get things done which is what caused his fall yesterday. ? ?LIVING ENVIRONMENT: ?Lives with: lives with their partner-girlfriend ?Lives in: House/apartment ?Stairs: Yes; External: 6 steps; on right going up, on left going up, and can reach both ?Has following equipment at home: Gilford Rile - 2 wheeled, Wheelchair (manual), Shower bench, and loftstrand crutches-he has a shower chair that he states he will not use, only purchased at his mother's request ? ?PLOF: Independent and parents and live-in gf help with ADLs sometimes, but he is able to do everything for himself.  He does not like asking for help. ? ?PATIENT GOALS "I want to be able to move better and be stronger if that's even possible" ? ?OBJECTIVE:  ?BP at onset of session, LUE in sitting:  138/74 ?DIAGNOSTIC FINDINGS: No new relevant imaging.  Upcoming MRI of cervical spine and brain, date unavailable. ? ?COGNITION: ?Overall cognitive status: Within functional limits for tasks assessed ?  ?SENSATION: ?Light touch: WFL ?Hot/Cold: WFL-no difference in shower ?Proprioception: WFL-tested finger/ankle ? ?COORDINATION: ?Unable to formally assess due to weakness ? ?EDEMA:  ?None noted in bilateral LE/UE ? ?MUSCLE TONE: mild catch and release BLE midway through ROM into hip and knee extension. ? ? ?MUSCLE LENGTH: ?Hamstrings: Right 45 deg; Left 30 deg ? ?DTRs:  ?Patella 2+ = Normal-L normal, R trace (1) ? ?POSTURE: No Significant postural limitations ? ?LE ROM:    ? ?Passive  Right ?08/14/2021  Left ?08/14/2021  ?Hip flexion    ?Hip extension    ?Hip abduction    ?Hip adduction    ?Hip internal rotation    ?Hip external rotation    ?Knee flexion    ?Knee extension    ?Ankle dorsiflexion    ?Ankle plantarflexion    ?Ankle inversion    ?Ankle eversion    ? (Blank rows = not tested) ? ?MMT:   ? ?MMT Right ?08/14/2021 Left ?08/14/2021  ?Hip flexion    ?Hip extension    ?Hip abduction    ?Hip adduction    ?Hip internal rotation    ?Hip external rotation    ?Knee flexion    ?Knee extension    ?Ankle dorsiflexion    ?Ankle plantarflexion    ?Ankle inversion    ?Ankle eversion    ?(Blank rows = not tested) ? ?BED MOBILITY:  ?Sit to supine Complete Independence ?Supine to sit Complete Independence ?Pt rolls from prone to supine as he climbs onto mat in quadruped and lowers to stomach prior to rolling. ? ?TRANSFERS: ?Assistive device utilized: Environmental consultant - 2 wheeled  ?Sit to stand: Modified  independence, SBA, CGA, and Min A ?Stand to sit: Modified independence, SBA, and CGA ?Chair to chair: Modified independence, SBA, and CGA ? ?GAIT: ?Gait pattern: step to pattern, decreased step length- Right, decreased step length- Left, decreased stance time- Right, decreased stance time- Left, decreased stride length, decreased hip/knee flexion- Right, decreased hip/knee flexion- Left, decreased ankle dorsiflexion- Right, decreased ankle dorsiflexion- Left, Left hip hike, Right foot flat, genu recurvatum- Right, genu recurvatum- Left, narrow BOS, and poor foot clearance- Left ?Distance walked: 46' ?Assistive device utilized: Environmental consultant - 2 wheeled ?Level of assistance: SBA ?Comments: Pt requires several standing rests due to increased UE use on RW, pt intermittently hops using RW with touching LE to ground.  When LLE fatigues at end of 75' in gym on level surface pt demonstrates increased L foot drop with toe drag.  He has good awareness of need to stop ambulating to prevent fall due to tripping. ? ?FUNCTIONAL TESTs:  ?5 times sit to  stand: 27.57sec ?Timed up and go (TUG):  ?Berg Balance Scale:  ? ? ?PATIENT EDUCATION: ?Education details: PT POC, assessments used and to be used at next visit, and goals set. ?Person educated: Patient ?Education [employer]

## 2021-08-16 ENCOUNTER — Ambulatory Visit: Payer: Medicare Other | Admitting: Physical Therapy

## 2021-08-21 ENCOUNTER — Encounter: Payer: Self-pay | Admitting: Physical Therapy

## 2021-08-21 ENCOUNTER — Other Ambulatory Visit: Payer: Self-pay

## 2021-08-21 ENCOUNTER — Ambulatory Visit: Payer: Medicare Other | Admitting: Physical Therapy

## 2021-08-21 ENCOUNTER — Other Ambulatory Visit (INDEPENDENT_AMBULATORY_CARE_PROVIDER_SITE_OTHER): Payer: Medicare Other

## 2021-08-21 DIAGNOSIS — E039 Hypothyroidism, unspecified: Secondary | ICD-10-CM | POA: Diagnosis not present

## 2021-08-21 DIAGNOSIS — M21372 Foot drop, left foot: Secondary | ICD-10-CM | POA: Diagnosis not present

## 2021-08-21 DIAGNOSIS — E1065 Type 1 diabetes mellitus with hyperglycemia: Secondary | ICD-10-CM | POA: Diagnosis not present

## 2021-08-21 DIAGNOSIS — M21371 Foot drop, right foot: Secondary | ICD-10-CM

## 2021-08-21 DIAGNOSIS — M6281 Muscle weakness (generalized): Secondary | ICD-10-CM

## 2021-08-21 DIAGNOSIS — R296 Repeated falls: Secondary | ICD-10-CM | POA: Diagnosis not present

## 2021-08-21 DIAGNOSIS — R2681 Unsteadiness on feet: Secondary | ICD-10-CM

## 2021-08-21 DIAGNOSIS — R2689 Other abnormalities of gait and mobility: Secondary | ICD-10-CM

## 2021-08-21 LAB — BASIC METABOLIC PANEL
BUN: 21 mg/dL (ref 6–23)
CO2: 30 mEq/L (ref 19–32)
Calcium: 9.5 mg/dL (ref 8.4–10.5)
Chloride: 102 mEq/L (ref 96–112)
Creatinine, Ser: 1.19 mg/dL (ref 0.40–1.50)
GFR: 71.96 mL/min (ref >60.00)
Glucose, Bld: 159 mg/dL — ABNORMAL HIGH (ref 70–99)
Potassium: 4.5 mEq/L (ref 3.5–5.1)
Sodium: 140 mEq/L (ref 135–145)

## 2021-08-21 LAB — HEMOGLOBIN A1C: Hgb A1c MFr Bld: 7.6 % — ABNORMAL HIGH (ref 4.6–6.5)

## 2021-08-21 NOTE — Therapy (Signed)
?OUTPATIENT PHYSICAL THERAPY TREATMENT NOTE ? ? ?Patient Name: Johnathan Ross ?MRN: [id number] ?DOB:1971/02/10, 51 y.o., male ?Today's Date: 08/21/2021 ? ?PCP: Janith Lima, MD ?REFERRING PROVIDER: Britt Bottom, MD ? ? PT End of Session - 08/21/21 1203   ? ? Visit Number 2   ? Number of Visits 17   ? Date for PT Re-Evaluation 10/13/21   ? Authorization Type Medicare A&B  Patient pays 20% of visit cost  Follow medicare guidelines  VL: [place]   ? Progress Note Due on Visit 10   ? PT Start Time 1200   ? PT Stop Time 1236   ? PT Time Calculation (min) 36 min   ? Equipment Utilized During Treatment Gait belt   ? Activity Tolerance Patient tolerated treatment well   ? Behavior During Therapy [facility] for tasks assessed/performed   ? ?  ?  ? ?  ? ? ?Past Medical History:  ?Diagnosis Date  ? Hyperlipidemia   ? Hypertension   ? Hypogonadism in male 11/01/2016  ? Hypothyroidism   ? Multiple sclerosis ([place])   ? Proliferative diabetic retinopathy(362.02)   ? Type 1 diabetes mellitus ([place]) dx'd 1994  ? Vitamin D insufficiency 11/29/2016  ? ?Past Surgical History:  ?Procedure Laterality Date  ? EYE SURGERY Bilateral   ? "laser for diabetic retinopathy"  ? FRACTURE SURGERY    ? IR VENO/EXT/UNI LEFT  03/24/2020  ? OPEN REDUCTION INTERNAL FIXATION (ORIF) TIBIA/FIBULA FRACTURE Right 10/30/2016  ? ORIF ANKLE FRACTURE Left 2015  ? ORIF TIBIA FRACTURE Right 10/30/2016  ? Procedure: OPEN REDUCTION INTERNAL FIXATION (ORIF) TIBIA FIBULA FRACTURE;  Surgeon: Altamese [place], MD;  Location: [place] [facility];  Service: Orthopedics;  Laterality: Right;  ? RETINAL LASER PROCEDURE Bilateral   ? "for diabetic retinopathy"  ? ?Patient Active Problem List  ? Diagnosis Date Noted  ? Colon cancer screening 08/01/2021  ? Clinical diagnosis of COVID-19 12/28/2020  ? OAB (overactive bladder) 09/21/2020  ? Conjunctival injection, right 09/21/2020  ? Essential hypertension 09/21/2020  ? Urinary dysfunction 09/21/2020  ? Thoracic outlet syndrome 05/03/2020  ? Controlled type 1  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 ([place]) 03/24/2020  ? Right epiretinal membrane 03/24/2020  ? Retinal exudates and deposits 03/24/2020  ? 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 Nuclear sclerotic cataract of both eyes 03/24/2020  ? Gait disorder 03/23/2020  ? Benign prostatic hyperplasia without lower urinary tract symptoms 03/01/2020  ? Diabetic gastroparesis associated with type 1 diabetes mellitus ([place]) 03/01/2020  ? Uncontrolled type 1 diabetes mellitus with hyperglycemia ([place]) 03/01/2020  ? Gait disturbance 08/19/2019  ? Acquired diplegia ([place]) 08/19/2019  ? Vitamin D insufficiency 11/29/2016  ? Hypogonadism in male 11/01/2016  ? 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HCC)   ? Hypothyroidism   ? Hypertension   ? Abnormal liver function tests 03/06/2013  ? Type I (juvenile type) diabetes mellitus without mention of complication, uncontrolled 12/03/2007  ? HYPERCHOLESTEROLEMIA 11/12/2007  ? MULTIPLE SCLEROSIS 08/29/2007  ? PROLIFERATIVE DIABETIC RETINOPATHY 08/29/2007  ? ? ?REFERRING DIAG: G35 (ICD-10-CM) - Multiple sclerosis ([place]) R26.9 (ICD-10-CM) - Gait disturbance  ? ?THERAPY DIAG:  ?Other abnormalities of gait and mobility ? ?Muscle weakness (generalized) ? ?Unsteadiness on feet ? ?Repeated falls ? ?Foot drop, left ? ?Foot drop, right ? ?PERTINENT HISTORY: vitamin D insufficiency, Type 1 IDDM w/ polyneuropathy, diabetic retinopathy, hypothyroidism, HTN, hypercholestrolemia,, R tibia fracture, L ankle fracture with ORIF, MS with acquired diplegia  ? ?PRECAUTIONS: Fall ? ?SUBJECTIVE: No  falls since eval.  He is doing well. ? ?PAIN:  ?Are you having pain? No ?Assessed BERG and TUG: ? [facility] PT Assessment - 08/21/21 1205   ? ?  ? Balance  ? Balance Assessed Yes   ?  ? Standardized Balance Assessment  ? Standardized Balance Assessment Berg Balance Test;Timed Up and Go Test   ?  ? Berg Balance Test  ? Sit to Stand Able to stand  independently using hands   ?  Standing Unsupported Able to stand 2 minutes with supervision   ? Sitting with Back Unsupported but Feet Supported on Floor or Stool Able to sit safely and securely 2 minutes   ? Stand to Sit Sits safely with minimal use of hands   ? Transfers Able to transfer safely, definite need of hands   ? Standing Unsupported with Eyes Closed Able to stand 10 seconds with supervision   ? Standing Unsupported with Feet Together Able to place feet together independently and stand for 1 minute with supervision   ? From Standing, Reach Forward with Outstretched Arm Can reach confidently >25 cm (10")   ? From Standing Position, Pick up Object from Floor Able to pick up shoe, needs supervision   ? From Standing Position, Turn to Look Behind Over each Shoulder Looks behind from both sides and weight shifts well   ? Turn 360 Degrees Needs assistance while turning   ? Standing Unsupported, Alternately Place Feet on Step/Stool Needs assistance to keep from falling or unable to try   ? Standing Unsupported, One Foot in Front Needs help to step but can hold 15 seconds   ? Standing on One Leg Tries to lift leg/unable to hold 3 seconds but remains standing independently   ? Total Score 36   ?  ? Timed Up and Go Test  ? TUG Normal TUG   ? Normal TUG (seconds) 40.18   ? ?  ?  ? ?  ?Stair assessment and training:  Pt performs 4 6" stairs using inc UE support on bilateral rails, SBA-CGA, pt is cognizant of unlocking knees prior to moving.  He uses reciprocal stepping.  Cued to trial step-to w/ RLE leading ascending, but pt demo dec initiation from one step to another. ?Initiated HEP:  STS w/o UE support w/ emphasis on control at knees, cuing for unlocking then controlled sit w/ dec UE support to promote eccentric motor control; standing weight shifts at countertop w/ UE support to promote motor control of quads ? ?OBJECTIVE:  ?BP at onset of session, LUE in sitting:  138/74 ?DIAGNOSTIC FINDINGS: No new relevant imaging.  Upcoming MRI of  cervical spine and brain, date unavailable. ?  ?COGNITION: ?Overall cognitive status: Within functional limits for tasks assessed ?            ?SENSATION: ?Light touch: WFL ?Hot/Cold: WFL-no difference in shower ?Proprioception: WFL-tested finger/ankle ?  ?COORDINATION: ?Unable to formally assess due to weakness ?  ?EDEMA:  ?None noted in bilateral LE/UE ?  ?MUSCLE TONE: mild catch and release BLE midway through ROM into hip and knee extension. ?  ?  ?MUSCLE LENGTH: ?Hamstrings: Right 45 deg; Left 30 deg ?  ?DTRs:  ?Patella 2+ = Normal-L normal, R trace (1) ?  ?POSTURE: No Significant postural limitations ?  ?LE ROM:    ?  ?Passive  Right ?08/14/2021 Left ?08/14/2021  ?Hip flexion      ?Hip extension      ?Hip abduction      ?  Hip adduction      ?Hip internal rotation      ?Hip external rotation      ?Knee flexion      ?Knee extension      ?Ankle dorsiflexion      ?Ankle plantarflexion      ?Ankle inversion      ?Ankle eversion      ? (Blank rows = not tested) ?  ?MMT:   ?  ?MMT Right ?08/14/2021 Left ?08/14/2021  ?Hip flexion      ?Hip extension      ?Hip abduction      ?Hip adduction      ?Hip internal rotation      ?Hip external rotation      ?Knee flexion      ?Knee extension      ?Ankle dorsiflexion      ?Ankle plantarflexion      ?Ankle inversion      ?Ankle eversion      ?(Blank rows = not tested) ?  ?BED MOBILITY:  ?Sit to supine Complete Independence ?Supine to sit Complete Independence ?Pt rolls from prone to supine as he climbs onto mat in quadruped and lowers to stomach prior to rolling. ?  ?TRANSFERS: ?Assistive device utilized: Environmental consultant - 2 wheeled  ?Sit to stand: Modified independence, SBA, CGA, and Min A ?Stand to sit: Modified independence, SBA, and CGA ?Chair to chair: Modified independence, SBA, and CGA ?  ?GAIT: ?Gait pattern: step to pattern, decreased step length- Right, decreased step length- Left, decreased stance time- Right, decreased stance time- Left, decreased stride length, decreased hip/knee  flexion- Right, decreased hip/knee flexion- Left, decreased ankle dorsiflexion- Right, decreased ankle dorsiflexion- Left, Left hip hike, Right foot flat, genu recurvatum- Right, genu recurvatum- Left, narr

## 2021-08-22 ENCOUNTER — Other Ambulatory Visit: Payer: Self-pay

## 2021-08-22 ENCOUNTER — Other Ambulatory Visit: Payer: Self-pay | Admitting: Endocrinology

## 2021-08-22 DIAGNOSIS — E1065 Type 1 diabetes mellitus with hyperglycemia: Secondary | ICD-10-CM

## 2021-08-22 DIAGNOSIS — E039 Hypothyroidism, unspecified: Secondary | ICD-10-CM

## 2021-08-22 LAB — TSH: TSH: 1.31 u[IU]/mL (ref 0.35–5.50)

## 2021-08-22 MED ORDER — CONTOUR NEXT TEST VI STRP: ORAL_STRIP | 3 refills | Status: DC

## 2021-08-23 ENCOUNTER — Other Ambulatory Visit: Payer: Self-pay

## 2021-08-23 ENCOUNTER — Encounter: Payer: Self-pay | Admitting: Physical Therapy

## 2021-08-23 ENCOUNTER — Ambulatory Visit: Payer: Medicare Other | Admitting: Physical Therapy

## 2021-08-23 DIAGNOSIS — M6281 Muscle weakness (generalized): Secondary | ICD-10-CM | POA: Diagnosis not present

## 2021-08-23 DIAGNOSIS — M21371 Foot drop, right foot: Secondary | ICD-10-CM | POA: Diagnosis not present

## 2021-08-23 DIAGNOSIS — R2681 Unsteadiness on feet: Secondary | ICD-10-CM

## 2021-08-23 DIAGNOSIS — R296 Repeated falls: Secondary | ICD-10-CM | POA: Diagnosis not present

## 2021-08-23 DIAGNOSIS — R2689 Other abnormalities of gait and mobility: Secondary | ICD-10-CM

## 2021-08-23 DIAGNOSIS — M21372 Foot drop, left foot: Secondary | ICD-10-CM | POA: Diagnosis not present

## 2021-08-23 NOTE — Therapy (Signed)
?OUTPATIENT PHYSICAL THERAPY TREATMENT NOTE ? ? ?Patient Name: Johnathan Ross ?MRN: [id number] ?DOB:05-31-1970, 51 y.o., male ?Today's Date: 08/23/2021 ? ?PCP: Janith Lima, MD ?REFERRING PROVIDER: Janith Lima, MD ? ? PT End of Session - 08/23/21 1107   ? ? Visit Number 3   ? Number of Visits 17   ? Date for PT Re-Evaluation 10/13/21   ? Authorization Type Medicare A&B  Patient pays 20% of visit cost  Follow medicare guidelines  VL: [place]   ? Progress Note Due on Visit 10   ? PT Start Time 1100   ? PT Stop Time 1153   ? PT Time Calculation (min) 53 min   ? Equipment Utilized During Treatment Gait belt   ? Activity Tolerance Patient tolerated treatment well   ? Behavior During Therapy [facility] for tasks assessed/performed   ? ?  ?  ? ?  ? ? ?Past Medical History:  ?Diagnosis Date  ? Hyperlipidemia   ? Hypertension   ? Hypogonadism in male 11/01/2016  ? Hypothyroidism   ? Multiple sclerosis ([place])   ? Proliferative diabetic retinopathy(362.02)   ? Type 1 diabetes mellitus ([place]) dx'd 1994  ? Vitamin D insufficiency 11/29/2016  ? ?Past Surgical History:  ?Procedure Laterality Date  ? EYE SURGERY Bilateral   ? "laser for diabetic retinopathy"  ? FRACTURE SURGERY    ? IR VENO/EXT/UNI LEFT  03/24/2020  ? OPEN REDUCTION INTERNAL FIXATION (ORIF) TIBIA/FIBULA FRACTURE Right 10/30/2016  ? ORIF ANKLE FRACTURE Left 2015  ? ORIF TIBIA FRACTURE Right 10/30/2016  ? Procedure: OPEN REDUCTION INTERNAL FIXATION (ORIF) TIBIA FIBULA FRACTURE;  Surgeon: Altamese Bettendorf, MD;  Location: [place];  Service: Orthopedics;  Laterality: Right;  ? RETINAL LASER PROCEDURE Bilateral   ? "for diabetic retinopathy"  ? ?Patient Active Problem List  ? Diagnosis Date Noted  ? Colon cancer screening 08/01/2021  ? Clinical diagnosis of COVID-19 12/28/2020  ? OAB (overactive bladder) 09/21/2020  ? Conjunctival injection, right 09/21/2020  ? Essential hypertension 09/21/2020  ? Urinary dysfunction 09/21/2020  ? Thoracic outlet syndrome 05/03/2020  ? Controlled type 1  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 ([place]) 03/24/2020  ? Right epiretinal membrane 03/24/2020  ? Retinal exudates and deposits 03/24/2020  ? 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 Nuclear sclerotic cataract of both eyes 03/24/2020  ? Gait disorder 03/23/2020  ? Benign prostatic hyperplasia without lower urinary tract symptoms 03/01/2020  ? Diabetic gastroparesis associated with type 1 diabetes mellitus ([place]) 03/01/2020  ? Uncontrolled type 1 diabetes mellitus with hyperglycemia ([place]) 03/01/2020  ? Gait disturbance 08/19/2019  ? Acquired diplegia ([place]) 08/19/2019  ? Vitamin D insufficiency 11/29/2016  ? Hypogonadism in male 11/01/2016  ? 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[facility])   ? Hypothyroidism   ? Hypertension   ? Abnormal liver function tests 03/06/2013  ? Type I (juvenile type) diabetes mellitus without mention of complication, uncontrolled 12/03/2007  ? HYPERCHOLESTEROLEMIA 11/12/2007  ? MULTIPLE SCLEROSIS 08/29/2007  ? PROLIFERATIVE DIABETIC RETINOPATHY 08/29/2007  ? ? ?REFERRING DIAG: G35 (ICD-10-CM) - Multiple sclerosis ([place]) R26.9 (ICD-10-CM) - Gait disturbance  ? ?THERAPY DIAG:  ?Other abnormalities of gait and mobility ? ?Muscle weakness (generalized) ? ?Unsteadiness on feet ? ?PERTINENT HISTORY: vitamin D insufficiency, Type 1 IDDM w/ polyneuropathy, diabetic retinopathy, hypothyroidism, HTN, hypercholestrolemia, R tibia fracture, L ankle fracture with ORIF, MS with acquired diplegia  ? ?PRECAUTIONS: Fall ? ?SUBJECTIVE: No falls since eval.  He is doing well. ? ?PAIN:  ?  Are you having pain? No ?TODAY'S TREATMENT: ?  Initiated session with time spent donning L [place] AFO to trial, pt ambulates 345' w/ inc time and several standing breaks to allow pt to inc weight bearing through legs to prevent cramping and tone.  Pt demonstrates improved foot clearance and sequencing of L heel strike into stance.  Cued for improved quad control into terminal  stance to prevent quick hyperextension.  Pt demo dec fatigue and inc weight bearing through legs.  Continues to have intermediate toe scuff w/ L AFO donned. ?Pt in quadruped rising into tall kneel on mat table>pt performs sit onto heels with controlled lower returning to tall kneel x12 ?Pt in tall kneel using red weighted ball to perform D1/D2>repeatedly rising from heels into tall kneeling while performing D1/D2 ?Modified exercise pt is already doing at home from quadruped to side lying hip extension with knee bent for days when his fatigue is too much to perform modified LE birddogs w/ knee bent ? ? ?OBJECTIVE:  ?BP at onset of session, LUE in sitting:  138/74 ?DIAGNOSTIC FINDINGS: No new relevant imaging.  Upcoming MRI of cervical spine and brain, date unavailable. ?  ?COGNITION: ?Overall cognitive status: Within functional limits for tasks assessed ?            ?SENSATION: ?Light touch: WFL ?Hot/Cold: WFL-no difference in shower ?Proprioception: WFL-tested finger/ankle ?  ?COORDINATION: ?Unable to formally assess due to weakness ?  ?EDEMA:  ?None noted in bilateral LE/UE ?  ?MUSCLE TONE: mild catch and release BLE midway through ROM into hip and knee extension. ?  ?  ?MUSCLE LENGTH: ?Hamstrings: Right 45 deg; Left 30 deg ?  ?DTRs:  ?Patella 2+ = Normal-L normal, R trace (1) ?  ?POSTURE: No Significant postural limitations ?  ?LE ROM:    ?  ?Passive  Right ?08/14/2021 Left ?08/14/2021  ?Hip flexion      ?Hip extension      ?Hip abduction      ?Hip adduction      ?Hip internal rotation      ?Hip external rotation      ?Knee flexion      ?Knee extension      ?Ankle dorsiflexion      ?Ankle plantarflexion      ?Ankle inversion      ?Ankle eversion      ? (Blank rows = not tested) ?  ?MMT:   ?  ?MMT Right ?08/14/2021 Left ?08/14/2021  ?Hip flexion      ?Hip extension      ?Hip abduction      ?Hip adduction      ?Hip internal rotation      ?Hip external rotation      ?Knee flexion      ?Knee extension      ?Ankle  dorsiflexion      ?Ankle plantarflexion      ?Ankle inversion      ?Ankle eversion      ?(Blank rows = not tested) ?  ?BED MOBILITY:  ?Sit to supine Complete [place] ?Supine to sit Complete Independence ?Pt rolls from prone to supine as he climbs onto mat in quadruped and lowers to stomach prior to rolling. ?  ?TRANSFERS: ?Assistive device utilized: Environmental consultant - 2 wheeled  ?Sit to stand: Modified independence, SBA, CGA, and Min A ?Stand to sit: Modified independence, SBA, and CGA ?Chair to chair: Modified independence, SBA, and CGA ?  ?GAIT: ?Gait pattern: step to pattern, decreased step length-  Right, decreased step length- Left, decreased stance time- Right, decreased stance time- Left, decreased stride length, decreased hip/knee flexion- Right, decreased hip/knee flexion- Left, decreased ankle dorsiflexion- Right, decreased ankle dorsiflexion- Left, Left hip hike, Right foot flat, genu recurvatum- Right, genu recurvatum- Left, narrow BOS, and poor foot clearance- Left ?Distance walked: 10' ?Assistive device utilized: Environmental consultant - 2 wheeled ?Level of assistance: SBA ?Comments: Pt requires several standing rests due to increased UE use on RW, pt intermittently hops using RW with touching LE to ground.  When LLE fatigues at end of 75' in gym on level surface pt demonstrates increased L foot drop with toe drag.  He has good awareness of need to stop ambulating to prevent fall due to tripping. ?  ?FUNCTIONAL TESTs:  ?5 times sit to stand: 27.57sec ? ?  ?  ?PATIENT EDUCATION: ?Education details: PT POC, assessments used and to be used at next visit, and goals set. ?Person educated: Patient ?Education method: Explanation ?Education comprehension: verbalized understanding ?  ?  ?HOME EXERCISE PROGRAM: ?To be initiated next session. ?  ?  ?  ?GOALS: ?Goals reviewed with patient? Yes ?  ?SHORT TERM GOALS: Target date:  09/15/2021 ?  ?Pt will be independent with initial functional strength and balance HEP with supervision from  girlfriend. ?Baseline: HEP to be initiated at next visit ?Goal status: INITIAL ?  ?2.  Pt will decrease 5xSTS to <25 seconds in order to demonstrate decreased risk for falls and improved functional bilateral LE

## 2021-08-24 ENCOUNTER — Encounter: Payer: Self-pay | Admitting: Endocrinology

## 2021-08-24 ENCOUNTER — Telehealth (INDEPENDENT_AMBULATORY_CARE_PROVIDER_SITE_OTHER): Payer: Medicare Other | Admitting: Endocrinology

## 2021-08-24 VITALS — BP 146/86

## 2021-08-24 DIAGNOSIS — E063 Autoimmune thyroiditis: Secondary | ICD-10-CM

## 2021-08-24 DIAGNOSIS — E1065 Type 1 diabetes mellitus with hyperglycemia: Secondary | ICD-10-CM

## 2021-08-24 DIAGNOSIS — E7800 Pure hypercholesterolemia, unspecified: Secondary | ICD-10-CM

## 2021-08-24 NOTE — Progress Notes (Addendum)
Subjective:  ?  ?       ? ? ? Patient ID: Johnathan Ross, male   DOB: 08/26/1970, 51 y.o.   MRN: [id number] ? ?I connected with the above-named patient by video enabled telemedicine application and verified that I am speaking with the correct person. ?The patient was explained the limitations of evaluation and management by telemedicine and the availability of in person appointments.  ?Patient also understood that there may be a patient responsible charge related to this service ? Location of the patient: Patient's home ? Location of the provider: Physician office ?Only the patient and myself were participating in the encounter ?The patient understood the above statements and agreed to proceed. ? ? ?Diabetes ? ?Diagnosis: Type 1 diabetes mellitus, date of diagnosis:  1995.  ? ?Insulin Pump followup:  ? ?CURRENT insulin pump:  Medtronic 770 G since 04/2021 ? ?HISTORY: An insulin pump has been in use since 03/13/11.  ? ?PUMP settings  ?  ?Basal rate: 12 AM- 4 AM = 0.25, 4 AM- 10 PM = 1.5,10 PM = 0.25 ? ?Carbohydrate to insulin ratio: 8 AM = 1: 7, 12 noon = 1: 8.5, 10 PM = 0.25 ? ?Sensitivity settings: 12 AM = 1: 45, 7 AM until 8 a.m. = 1: 35 ?Blood sugar target 130 ?ACTIVE insulin time 3 hours ? ?Recent history: ? ?His A1c is slightly higher at 7.6 compared to 7 ? ? ?Management reviewed from Dexcom sensor and pump download, current history and problems identified: ? ? ?He has been having higher blood sugars postprandially with no consistent pattern ?However he is saying that he is bolusing only half the amount coverage at lunch and dinner before the meal and the rest of it 30 to 60 minutes later because of instructions from the previous educator ?Only at lunchtime he will sometimes have fast food but does not always have much high fat intake in the evenings ?His carbohydrate coverage has been changed to 9.3 instead of 8.5 and not clear why  ?Generally appears to have some high blood sugars late afternoon and late  evening ?Some of his high readings may be related to late boluses if he is not bolusing at the start of the meal ?He also says that he is not getting enough correction for high readings based on current settings ?However he has not changed his sensitivity to 35 as recommended on the last visit ?Hypoglycemia has been infrequent and only occasionally may be related to over bolusing or late boluses ? ?The guardian sensor download for the last 2 weeks is interpreted as follows ? ?GMI is 7.0 ? ?Has modest variability with standard deviation 50 but CV is higher than before at 32  ?HYPERGLYCEMIC episodes have occurred 3 times in the afternoon at 2-3 PM, also either at 8 or 9 PM more frequently ?OVERNIGHT blood sugars are generally fairly stable although may be occasionally significantly higher in the early part of the night and leveling off by 5-6 AM to close to target ?POSTPRANDIAL readings are on an average evenly controlled after lunch and dinner ?Blood sugars about 3 hours after the meal are labeled with the Premeal reading usually ?However response to meals is variable with occasionally significant rise in blood sugar but also low readings sporadically ?Some of the high readings appear to be related to the bolus being delivered late especially in the evening ?HYPOGLYCEMIA in occurring.DIAG ? either after lunch or dinner related to a preceding bolus ?Calibration done about 3 times  a day and blood sugars appear to be accurate compared to fingersticks ? ?Time in range compared to the last visit is 71 compared to 82% ? ?CGM use % of time 86, auto mode 94%  ?2-week average/GV 150/32  ?Time in range        71%  ?% Time Above 180 24  ?% Time above 250 3  ?% Time Below 70 2  ? ?  ?PRE-MEAL Fasting Lunch Dinner Bedtime Overall  ?Glucose range:       ?Averages:  170 165 190/186 154  ? ?POST-MEAL PC Breakfast PC Lunch PC Dinner  ?Glucose range:     ?Averages:  134 149  ? ?Previously: ? ?CGM use % of time 89  ?2-week average/GV  137/39  ?Time in range        82%  ?% Time Above 180 16  ?% Time above 250   ?% Time Below 70 2  ? ?  ?PRE-MEAL Fasting Lunch Dinner Bedtime Overall  ?Glucose range:       ?Averages: 134 144 159 162/139   ? ?POST-MEAL PC Breakfast PC Lunch PC Dinner  ?Glucose range:     ?Averages: 147 161 139  ? ? ? ?Wt Readings from Last 3 Encounters:  ?08/01/21 204 lb 5 oz (92.7 kg)  ?07/20/21 211 lb 6.4 oz (95.9 kg)  ?05/24/21 211 lb 6.4 oz (95.9 kg)  ? ? ? ?Lab Results  ?Component Value Date  ? HGBA1C 7.6 (H) 08/21/2021  ? HGBA1C 7.0 (H) 05/19/2021  ? HGBA1C 7.6 (H) 02/09/2021  ? ?Lab Results  ?Component Value Date  ? MICROALBUR <0.7 03/01/2020  ? [place] 63 05/19/2021  ? CREATININE 1.19 08/21/2021  ? ? ? ?OTHER active problems discussed today: See review of systems ? ? ? ?Allergies as of 08/24/2021   ? ?   Reactions  ? Lisinopril Cough  ? ?  ? ?  ?Medication List  ?  ? ?  ? Accurate as of August 24, 2021 10:45 AM. If you have any questions, ask your nurse or doctor.  ?  ?  ? ?  ? ?amoxicillin-clavulanate 875-125 MG tablet ?Commonly known as: Augmentin ?Take 1 tablet by mouth 2 (two) times daily. ?  ?baclofen 10 MG tablet ?Commonly known as: LIORESAL ?Take one or two pills three times a day (up to 6 pills/day) ?  ?Contour Next Test test strip ?Generic drug: glucose blood ?USE 2 TIMES PER DAY. E10.65 ?  ?dalfampridine 10 MG Tb12 ?Commonly known as: Ampyra ?Take 1 tablet (10 mg total) by mouth 2 (two) times daily. ?  ?Dexcom G5 Receiver [place] ?by Does not apply route. ?  ?Dexcom G6 Sensor Misc ?Use as directed. ?  ?Dexcom G6 Transmitter Misc ?Use to monitor blood sugar ?  ?Gvoke HypoPen 2-Pack 1 MG/0.2ML [place] ?Generic drug: Glucagon ?Inject 1 Act into the skin daily as needed. ?  ?losartan 50 MG tablet ?Commonly known as: COZAAR ?TAKE 1 TABLET BY MOUTH EVERY DAY ?  ?NovoLOG 100 UNIT/ML injection ?Generic drug: insulin aspart ?USE UP TO 80 UNITS IN INSULIN PUMP DAILY-DX CODE E10.8 ?  ?simvastatin 20 MG tablet ?Commonly known as:  ZOCOR ?TAKE 1 TABLET BY MOUTH EVERYDAY AT BEDTIME ?  ?solifenacin 5 MG tablet ?Commonly known as: VESICARE ?TAKE 1 TABLET (5 MG TOTAL) BY MOUTH DAILY. ?  ?Synthroid 125 MCG tablet ?Generic drug: levothyroxine ?TAKE 1 TABLET BY MOUTH 6 DAYS PER WEEK ?  ? ?  ? ? ?Allergies:  ?Allergies  ?  Allergen Reactions  ? Lisinopril Cough  ? ? ?Past Medical History:  ?Diagnosis Date  ? Hyperlipidemia   ? Hypertension   ? Hypogonadism in male 11/01/2016  ? Hypothyroidism   ? Multiple sclerosis ([place])   ? Proliferative diabetic retinopathy(362.02)   ? Type 1 diabetes mellitus ([place]) dx'd 1994  ? Vitamin D insufficiency 11/29/2016  ? ? ?Past Surgical History:  ?Procedure Laterality Date  ? EYE SURGERY Bilateral   ? "laser for diabetic retinopathy"  ? FRACTURE SURGERY    ? IR VENO/EXT/UNI LEFT  03/24/2020  ? OPEN REDUCTION INTERNAL FIXATION (ORIF) TIBIA/FIBULA FRACTURE Right 10/30/2016  ? ORIF ANKLE FRACTURE Left 2015  ? ORIF TIBIA FRACTURE Right 10/30/2016  ? Procedure: OPEN REDUCTION INTERNAL FIXATION (ORIF) TIBIA FIBULA FRACTURE;  Surgeon: Altamese Niverville, MD;  Location: [place];  Service: Orthopedics;  Laterality: Right;  ? RETINAL LASER PROCEDURE Bilateral   ? "for diabetic retinopathy"  ? ? ?Family History  ?Problem Relation Age of Onset  ? Diabetes Sister   ? Colon cancer Maternal Grandmother   ? Colon polyps Maternal Uncle   ? ? ?Social History:  reports that he has never smoked. He has never used smokeless tobacco. He reports that he does not drink alcohol and does not use drugs. ? ? ? ?Review of Systems ?  ? ?  HYPERCHOLESTEROLEMIA: He has been taking simvastatin 20 mg daily ? ?LDL at goal ? ?Lab Results  ?Component Value Date  ? CHOL 114 05/19/2021  ? HDL 41.30 05/19/2021  ? [place] 63 05/19/2021  ? LDLDIRECT 65.0 02/09/2021  ? TRIG 46.0 05/19/2021  ? CHOLHDL 3 05/19/2021  ? ?Lab Results  ?Component Value Date  ? ALT 18 05/19/2021  ? ? ?HYPERTENSION: He had been on lisinopril with usually good control  of blood pressure ? ?He is on  losartan 50 mg daily ?Blood pressure today was recorded at home ? ?  ?BP Readings from Last 3 Encounters:  ?08/24/21 (!) 146/86  ?08/01/21 118/76  ?05/24/21 104/70  ? ? ?He has MULTIPLE sclerosis, is followed

## 2021-08-28 ENCOUNTER — Ambulatory Visit: Payer: Medicare Other | Attending: Internal Medicine | Admitting: Physical Therapy

## 2021-08-28 ENCOUNTER — Encounter: Payer: Self-pay | Admitting: Physical Therapy

## 2021-08-28 ENCOUNTER — Other Ambulatory Visit: Payer: Medicare Other

## 2021-08-28 DIAGNOSIS — R296 Repeated falls: Secondary | ICD-10-CM | POA: Diagnosis present

## 2021-08-28 DIAGNOSIS — R2689 Other abnormalities of gait and mobility: Secondary | ICD-10-CM | POA: Insufficient documentation

## 2021-08-28 DIAGNOSIS — E1065 Type 1 diabetes mellitus with hyperglycemia: Secondary | ICD-10-CM | POA: Diagnosis not present

## 2021-08-28 DIAGNOSIS — M6281 Muscle weakness (generalized): Secondary | ICD-10-CM | POA: Diagnosis present

## 2021-08-28 DIAGNOSIS — R262 Difficulty in walking, not elsewhere classified: Secondary | ICD-10-CM | POA: Diagnosis present

## 2021-08-28 DIAGNOSIS — M21372 Foot drop, left foot: Secondary | ICD-10-CM | POA: Diagnosis present

## 2021-08-28 DIAGNOSIS — R2681 Unsteadiness on feet: Secondary | ICD-10-CM | POA: Diagnosis present

## 2021-08-28 LAB — MICROALBUMIN / CREATININE URINE RATIO
Creatinine,U: 150.8 mg/dL
Microalb Creat Ratio: 0.5 mg/g (ref 0.0–30.0)
Microalb, Ur: <0.7 mg/dL (ref 0.0–1.9)

## 2021-08-28 NOTE — Therapy (Signed)
?OUTPATIENT PHYSICAL THERAPY TREATMENT NOTE ? ? ?Patient Name: Johnathan Ross ?MRN: [id number] ?DOB:11-05-1970, 51 y.o., male ?Today's Date: 08/28/2021 ? ?PCP: Janith Lima, MD ?REFERRING PROVIDER: Janith Lima, MD ? ? PT End of Session - 08/28/21 1028   ? ? Visit Number 4   ? Number of Visits 17   ? Date for PT Re-Evaluation 10/13/21   ? Authorization Type Medicare A&B  Patient pays 20% of visit cost  Follow medicare guidelines  VL: [place]   ? Progress Note Due on Visit 10   ? PT Start Time 1015   ? PT Stop Time 1100   ? PT Time Calculation (min) 45 min   ? Equipment Utilized During Treatment Gait belt   ? Activity Tolerance Patient tolerated treatment well   ? Behavior During Therapy [facility] for tasks assessed/performed   ? ?  ?  ? ?  ? ? ?Past Medical History:  ?Diagnosis Date  ? Hyperlipidemia   ? Hypertension   ? Hypogonadism in male 11/01/2016  ? Hypothyroidism   ? Multiple sclerosis ([place])   ? Proliferative diabetic retinopathy(362.02)   ? Type 1 diabetes mellitus ([place]) dx'd 1994  ? Vitamin D insufficiency 11/29/2016  ? ?Past Surgical History:  ?Procedure Laterality Date  ? EYE SURGERY Bilateral   ? "laser for diabetic retinopathy"  ? FRACTURE SURGERY    ? IR VENO/EXT/UNI LEFT  03/24/2020  ? OPEN REDUCTION INTERNAL FIXATION (ORIF) TIBIA/FIBULA FRACTURE Right 10/30/2016  ? ORIF ANKLE FRACTURE Left 2015  ? ORIF TIBIA FRACTURE Right 10/30/2016  ? Procedure: OPEN REDUCTION INTERNAL FIXATION (ORIF) TIBIA FIBULA FRACTURE;  Surgeon: Altamese Welcome, MD;  Location: [place];  Service: Orthopedics;  Laterality: Right;  ? RETINAL LASER PROCEDURE Bilateral   ? "for diabetic retinopathy"  ? ?Patient Active Problem List  ? Diagnosis Date Noted  ? Colon cancer screening 08/01/2021  ? Clinical diagnosis of COVID-19 12/28/2020  ? OAB (overactive bladder) 09/21/2020  ? Conjunctival injection, right 09/21/2020  ? Essential hypertension 09/21/2020  ? Urinary dysfunction 09/21/2020  ? Thoracic outlet syndrome 05/03/2020  ? Controlled type 1  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 ([place]) 03/24/2020  ? Right epiretinal membrane 03/24/2020  ? Retinal exudates and deposits 03/24/2020  ? 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 Nuclear sclerotic cataract of both eyes 03/24/2020  ? Gait disorder 03/23/2020  ? Benign prostatic hyperplasia without lower urinary tract symptoms 03/01/2020  ? Diabetic gastroparesis associated with type 1 diabetes mellitus ([place]) 03/01/2020  ? Uncontrolled type 1 diabetes mellitus with hyperglycemia ([place]) 03/01/2020  ? Gait disturbance 08/19/2019  ? Acquired diplegia ([place]) 08/19/2019  ? Vitamin D insufficiency 11/29/2016  ? Hypogonadism in male 11/01/2016  ? 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[facility])   ? Hypothyroidism   ? Hypertension   ? Abnormal liver function tests 03/06/2013  ? Type I (juvenile type) diabetes mellitus without mention of complication, uncontrolled 12/03/2007  ? HYPERCHOLESTEROLEMIA 11/12/2007  ? MULTIPLE SCLEROSIS 08/29/2007  ? PROLIFERATIVE DIABETIC RETINOPATHY 08/29/2007  ? ? ?REFERRING DIAG: G35 (ICD-10-CM) - Multiple sclerosis ([place]) R26.9 (ICD-10-CM) - Gait disturbance  ? ?THERAPY DIAG:  ?Other abnormalities of gait and mobility ? ?Muscle weakness (generalized) ? ?Unsteadiness on feet ? ?Foot drop, left ? ?PERTINENT HISTORY: vitamin D insufficiency, Type 1 IDDM w/ polyneuropathy, diabetic retinopathy, hypothyroidism, HTN, hypercholestrolemia, R tibia fracture, L ankle fracture with ORIF, MS with acquired diplegia  ? ?PRECAUTIONS: Fall ? ?SUBJECTIVE: No falls since eval.  He is doing  well. ? ?PAIN:  ?Are you having pain? No ?TODAY'S TREATMENT: ?Hamstring curls in supine w/ peanut ball under lower leg, therapist stabilized ball and provided AAROM for first set and dec assistance for second set; 2x6 ?Supine bridging using peanut ball under knees to challenge glut and core 2x6 ?Sidelying hip extension using board between LE>added  knee flexion, 3x10, pt with  difficulty performing initial unlocking of knee, given inc rest to facilitate engagement, therapist tapping at hamstring w/ AAROM during fatigued reps using towel under foot ?Birddogs w/ knee bent 2x8, inc rest to facilitate improved hip extensor engagement w/ cuing to forward weight shift prior to lift ?Quadruped w/ arm walk outs and reach on wedge>added second set w/ reach and hold to further inc core challenge; 2x10 ?Reviewed quad stretch at pt request, safe to continue. ?Seated Hamstring stretch 2x30 bilaterally, only needed on L-instructed for LLE stretch for HEP ? ? ?OBJECTIVE:  ?BP at onset of session, LUE in sitting:  138/74 ?DIAGNOSTIC FINDINGS: No new relevant imaging.  Upcoming MRI of cervical spine and brain, date unavailable. ?  ?COGNITION: ?Overall cognitive status: Within functional limits for tasks assessed ?            ?SENSATION: ?Light touch: WFL ?Hot/Cold: WFL-no difference in shower ?Proprioception: WFL-tested finger/ankle ?  ?COORDINATION: ?Unable to formally assess due to weakness ?  ?EDEMA:  ?None noted in bilateral LE/UE ?  ?MUSCLE TONE: mild catch and release BLE midway through ROM into hip and knee extension. ?  ?  ?MUSCLE LENGTH: ?Hamstrings: Right 45 deg; Left 30 deg ?  ?DTRs:  ?Patella 2+ = Normal-L normal, R trace (1) ?  ?POSTURE: No Significant postural limitations ?  ?LE ROM:    ?  ?Passive  Right ?08/14/2021 Left ?08/14/2021  ?Hip flexion      ?Hip extension      ?Hip abduction      ?Hip adduction      ?Hip internal rotation      ?Hip external rotation      ?Knee flexion      ?Knee extension      ?Ankle dorsiflexion      ?Ankle plantarflexion      ?Ankle inversion      ?Ankle eversion      ? (Blank rows = not tested) ?  ?MMT:   ?  ?MMT Right ?08/14/2021 Left ?08/14/2021  ?Hip flexion      ?Hip extension      ?Hip abduction      ?Hip adduction      ?Hip internal rotation      ?Hip external rotation      ?Knee flexion      ?Knee extension      ?Ankle dorsiflexion      ?Ankle  plantarflexion      ?Ankle inversion      ?Ankle eversion      ?(Blank rows = not tested) ?  ?BED MOBILITY:  ?Sit to supine Complete Independence ?Supine to sit Complete Independence ?Pt rolls from prone to supine as he climbs onto mat in quadruped and lowers to stomach prior to rolling. ?  ?TRANSFERS: ?Assistive device utilized: Environmental consultant - 2 wheeled  ?Sit to stand: Modified independence, SBA, CGA, and Min A ?Stand to sit: Modified independence, SBA, and CGA ?Chair to chair: Modified independence, SBA, and CGA ?  ?GAIT: ?Gait pattern: step to pattern, decreased step length- Right, decreased step length- Left, decreased stance time- Right, decreased stance time- Left, decreased stride  length, decreased hip/knee flexion- Right, decreased hip/knee flexion- Left, decreased ankle dorsiflexion- Right, decreased ankle dorsiflexion- Left, Left hip hike, Right foot flat, genu recurvatum- Right, genu recurvatum- Left, narrow BOS, and poor foot clearance- Left ?Distance walked: 65' ?Assistive device utilized: Environmental consultant - 2 wheeled ?Level of assistance: SBA ?Comments: Pt requires several standing rests due to increased UE use on RW, pt intermittently hops using RW with touching LE to ground.  When LLE fatigues at end of 75' in gym on level surface pt demonstrates increased L foot drop with toe drag.  He has good awareness of need to stop ambulating to prevent fall due to tripping. ?  ?FUNCTIONAL TESTs:  ?5 times sit to stand: 27.57sec ? ?  ?  ?PATIENT EDUCATION: ?Education details: PT POC, assessments used and to be used at next visit, and goals set. ?Person educated: Patient ?Education method: Explanation ?Education comprehension: verbalized understanding ?  ?  ?HOME EXERCISE PROGRAM: ?Birddogs w/ modified knee bent position 4x4-cued to rest to facilitate improved ROM and to weight shift onto UE to place hips into slight extension prior to lift ?L hamstring stretch 2x30sec cued for stretch to comfort ?Bil quad stretch 2x30sec ?Tall  kneel holding 2 mins ?Walking program w/ inc weight bearing through legs ?Pt declined handout. ?  ?  ?  ?GOALS: ?Goals reviewed with patient? Yes ?  ?SHORT TERM GOALS: Target date:  09/15/2021 ?  ?Pt will be independ

## 2021-08-30 ENCOUNTER — Encounter: Payer: Self-pay | Admitting: Physical Therapy

## 2021-08-30 ENCOUNTER — Ambulatory Visit: Payer: Medicare Other | Admitting: Physical Therapy

## 2021-08-30 DIAGNOSIS — M21372 Foot drop, left foot: Secondary | ICD-10-CM

## 2021-08-30 DIAGNOSIS — R2681 Unsteadiness on feet: Secondary | ICD-10-CM

## 2021-08-30 DIAGNOSIS — M6281 Muscle weakness (generalized): Secondary | ICD-10-CM

## 2021-08-30 DIAGNOSIS — R2689 Other abnormalities of gait and mobility: Secondary | ICD-10-CM | POA: Diagnosis not present

## 2021-08-30 NOTE — Therapy (Signed)
?OUTPATIENT PHYSICAL THERAPY TREATMENT NOTE ? ? ?Patient Name: Johnathan Ross ?MRN: [id number] ?DOB:[birth year], 51 y.o., male ?Today's Date: 08/30/2021 ? ?PCP: Janith Lima, MD ?REFERRING PROVIDER: Janith Lima, MD ? ? PT End of Session - 08/30/21 1115   ? ? Visit Number 5   ? Number of Visits 17   ? Date for PT Re-Evaluation 10/13/21   ? Authorization Type Medicare A&B  Patient pays 20% of visit cost  Follow medicare guidelines  VL: [place]   ? Progress Note Due on Visit 10   ? PT Start Time 1106   ? PT Stop Time 0973   ? PT Time Calculation (min) 43 min   ? Equipment Utilized During Treatment Gait belt   ? Activity Tolerance Patient tolerated treatment well   ? Behavior During Therapy [facility] for tasks assessed/performed   ? ?  ?  ? ?  ? ? ?Past Medical History:  ?Diagnosis Date  ? Hyperlipidemia   ? Hypertension   ? Hypogonadism in male 11/01/2016  ? Hypothyroidism   ? Multiple sclerosis ([place])   ? Proliferative diabetic retinopathy(362.02)   ? Type 1 diabetes mellitus ([place]) dx'd 1994  ? Vitamin D insufficiency 11/29/2016  ? ?Past Surgical History:  ?Procedure Laterality Date  ? EYE SURGERY Bilateral   ? "laser for diabetic retinopathy"  ? FRACTURE SURGERY    ? IR VENO/EXT/UNI LEFT  03/24/2020  ? OPEN REDUCTION INTERNAL FIXATION (ORIF) TIBIA/FIBULA FRACTURE Right 10/30/2016  ? ORIF ANKLE FRACTURE Left 2015  ? ORIF TIBIA FRACTURE Right 10/30/2016  ? Procedure: OPEN REDUCTION INTERNAL FIXATION (ORIF) TIBIA FIBULA FRACTURE;  Surgeon: Altamese [place], MD;  Location: [facility];  Service: Orthopedics;  Laterality: Right;  ? RETINAL LASER PROCEDURE Bilateral   ? "for diabetic retinopathy"  ? ?Patient Active Problem List  ? Diagnosis Date Noted  ? Colon cancer screening 08/01/2021  ? Clinical diagnosis of COVID-19 12/28/2020  ? OAB (overactive bladder) 09/21/2020  ? Conjunctival injection, right 09/21/2020  ? Essential hypertension 09/21/2020  ? Urinary dysfunction 09/21/2020  ? Thoracic outlet syndrome 05/03/2020  ? Controlled type 1  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 ([place]) 03/24/2020  ? Right epiretinal membrane 03/24/2020  ? Retinal exudates and deposits 03/24/2020  ? 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 Nuclear sclerotic cataract of both eyes 03/24/2020  ? Gait disorder 03/23/2020  ? Benign prostatic hyperplasia without lower urinary tract symptoms 03/01/2020  ? Diabetic gastroparesis associated with type 1 diabetes mellitus ([place]) 03/01/2020  ? Uncontrolled type 1 diabetes mellitus with hyperglycemia ([place]) 03/01/2020  ? Gait disturbance 08/19/2019  ? Acquired diplegia ([place]) 08/19/2019  ? Vitamin D insufficiency 11/29/2016  ? Hypogonadism in male 11/01/2016  ? 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[facility])   ? Hypothyroidism   ? Hypertension   ? Abnormal liver function tests 03/06/2013  ? Type I (juvenile type) diabetes mellitus without mention of complication, uncontrolled 12/03/2007  ? HYPERCHOLESTEROLEMIA 11/12/2007  ? MULTIPLE SCLEROSIS 08/29/2007  ? PROLIFERATIVE DIABETIC RETINOPATHY 08/29/2007  ? ? ?REFERRING DIAG: G35 (ICD-10-CM) - Multiple sclerosis ([place]) R26.9 (ICD-10-CM) - Gait disturbance  ? ?THERAPY DIAG:  ?Other abnormalities of gait and mobility ? ?Muscle weakness (generalized) ? ?Unsteadiness on feet ? ?Foot drop, left ? ?PERTINENT HISTORY: vitamin D insufficiency, Type 1 IDDM w/ polyneuropathy, diabetic retinopathy, hypothyroidism, HTN, hypercholestrolemia, R tibia fracture, L ankle fracture with ORIF, MS with acquired diplegia  ? ?PRECAUTIONS: Fall ? ?SUBJECTIVE: Pt states he felt he was walking better  last session than today.  He feels his left leg is locking out more than the right today. ? ?PAIN:  ?Are you having pain? No ?TODAY'S TREATMENT: ?Supine Sahrmann progression, position 2, for core stability and LE coordination 1x12 w/ inc time to perform task, used tactile cues for TrA engagement just distal to PSIS -added to HEP ?Quadruped D1/D2 cued for forward weight  shift to promote glut and core engagement 3x8 each arm ?Pt stands at counter with BUE fingertip support, cued for relaxation at upper trap mm, performing wide semi-tandem weight shifting w/ emphasis on unlocking knee and controlled knee extension just to point prior to hyperextension, therapist providing posterior blocking and cuing at knee.   ?Pt standing at counter using BUE support to perform hamstring curls w/ RLE getting less ROM than LLE, therapist provides hamstring taping at insertion to generate mm force.  Progressed to modified standing mountain climber with cuing for hip flexor and hamstring engagement.  Pt able to clear heel and toe from ground for first 5 reps, last 5 reps cued to clear heel with taping to engage hamstrings further.  Extreme fatigue noted. ? ? ?OBJECTIVE:  ?BP at onset of session, LUE in sitting:  138/74 ?DIAGNOSTIC FINDINGS: No new relevant imaging.  Upcoming MRI of cervical spine and brain, date unavailable. ?  ?COGNITION: ?Overall cognitive status: Within functional limits for tasks assessed ?            ?SENSATION: ?Light touch: WFL ?Hot/Cold: WFL-no difference in shower ?Proprioception: WFL-tested finger/ankle ?  ?COORDINATION: ?Unable to formally assess due to weakness ?  ?EDEMA:  ?None noted in bilateral LE/UE ?  ?MUSCLE TONE: mild catch and release BLE midway through ROM into hip and knee extension. ?  ?  ?MUSCLE LENGTH: ?Hamstrings: Right 45 deg; Left 30 deg ?  ?DTRs:  ?Patella 2+ = Normal-L normal, R trace (1) ?  ?POSTURE: No Significant postural limitations ?  ?LE ROM:    ?  ?Passive  Right ?08/14/2021 Left ?08/14/2021  ?Hip flexion      ?Hip extension      ?Hip abduction      ?Hip adduction      ?Hip internal rotation      ?Hip external rotation      ?Knee flexion      ?Knee extension      ?Ankle dorsiflexion      ?Ankle plantarflexion      ?Ankle inversion      ?Ankle eversion      ? (Blank rows = not tested) ?  ?MMT:   ?  ?MMT Right ?08/14/2021 Left ?08/14/2021  ?Hip flexion       ?Hip extension      ?Hip abduction      ?Hip adduction      ?Hip internal rotation      ?Hip external rotation      ?Knee flexion      ?Knee extension      ?Ankle dorsiflexion      ?Ankle plantarflexion      ?Ankle inversion      ?Ankle eversion      ?(Blank rows = not tested) ?  ?BED MOBILITY:  ?Sit to supine Complete Independence ?Supine to sit Complete Independence ?Pt rolls from prone to supine as he climbs onto mat in quadruped and lowers to stomach prior to rolling. ?  ?TRANSFERS: ?Assistive device utilized: Environmental consultant - 2 wheeled  ?Sit to stand: Modified independence, SBA, CGA, and Min A ?Stand to  sit: Modified independence, SBA, and CGA ?Chair to chair: Modified independence, SBA, and CGA ?  ?GAIT: ?Gait pattern: step to pattern, decreased step length- Right, decreased step length- Left, decreased stance time- Right, decreased stance time- Left, decreased stride length, decreased hip/knee flexion- Right, decreased hip/knee flexion- Left, decreased ankle dorsiflexion- Right, decreased ankle dorsiflexion- Left, Left hip hike, Right foot flat, genu recurvatum- Right, genu recurvatum- Left, narrow BOS, and poor foot clearance- Left ?Distance walked: 20' ?Assistive device utilized: Environmental consultant - 2 wheeled ?Level of assistance: SBA ?Comments: Pt requires several standing rests due to increased UE use on RW, pt intermittently hops using RW with touching LE to ground.  When LLE fatigues at end of 75' in gym on level surface pt demonstrates increased L foot drop with toe drag.  He has good awareness of need to stop ambulating to prevent fall due to tripping. ?  ?FUNCTIONAL TESTs:  ?5 times sit to stand: 27.57sec ? ?  ?  ?PATIENT EDUCATION: ?Education details: Addition to HEP. ?Person educated: Patient ?Education method: Explanation ?Education comprehension: verbalized understanding ?  ?  ?HOME EXERCISE PROGRAM: ?Code from prior episode of care 11/24/2020 (pt still follows):  [email or web address] ?Access Code: [email or web address] ?URL:  [email or web address] ?Date: 08/30/2021 ?Prepared by: Elease Etienne ? ?Exercises ?- Child's Pose Stretch  - 1 x daily - 7 x weekly - 3 sets - 20 second hold ?- Quadruped Hip Extension Kicks  - 1 x

## 2021-09-05 ENCOUNTER — Ambulatory Visit: Payer: Medicare Other | Admitting: Physical Therapy

## 2021-09-05 ENCOUNTER — Encounter: Payer: Self-pay | Admitting: Physical Therapy

## 2021-09-05 DIAGNOSIS — R2689 Other abnormalities of gait and mobility: Secondary | ICD-10-CM | POA: Diagnosis not present

## 2021-09-05 DIAGNOSIS — M21372 Foot drop, left foot: Secondary | ICD-10-CM

## 2021-09-05 DIAGNOSIS — M6281 Muscle weakness (generalized): Secondary | ICD-10-CM

## 2021-09-05 DIAGNOSIS — R296 Repeated falls: Secondary | ICD-10-CM

## 2021-09-05 DIAGNOSIS — R2681 Unsteadiness on feet: Secondary | ICD-10-CM

## 2021-09-05 NOTE — Therapy (Signed)
?OUTPATIENT PHYSICAL THERAPY TREATMENT NOTE ? ? ?Patient Name: Johnathan Ross ?MRN: [id number] ?DOB:1970-06-23, 51 y.o., male ?Today's Date: 09/06/2021 ? ?PCP: Janith Lima, MD ?REFERRING PROVIDER: Janith Lima, MD ? ? PT End of Session - 09/05/21 1327   ? ? Visit Number 6   ? Number of Visits 17   ? Date for PT Re-Evaluation 10/13/21   ? Authorization Type Medicare A&B  Patient pays 20% of visit cost  Follow medicare guidelines  VL: [place]   ? Progress Note Due on Visit 10   ? PT Start Time 1025   ? PT Stop Time 1400   ? PT Time Calculation (min) 43 min   ? Equipment Utilized During Treatment Gait belt   ? Activity Tolerance Patient tolerated treatment well   ? Behavior During Therapy [facility] for tasks assessed/performed   ? ?  ?  ? ?  ? ? ? ?Past Medical History:  ?Diagnosis Date  ? Hyperlipidemia   ? Hypertension   ? Hypogonadism in male 11/01/2016  ? Hypothyroidism   ? Multiple sclerosis ([place])   ? Proliferative diabetic retinopathy(362.02)   ? Type 1 diabetes mellitus ([place]) dx'd 1994  ? Vitamin D insufficiency 11/29/2016  ? ?Past Surgical History:  ?Procedure Laterality Date  ? EYE SURGERY Bilateral   ? "laser for diabetic retinopathy"  ? FRACTURE SURGERY    ? IR VENO/EXT/UNI LEFT  03/24/2020  ? OPEN REDUCTION INTERNAL FIXATION (ORIF) TIBIA/FIBULA FRACTURE Right 10/30/2016  ? ORIF ANKLE FRACTURE Left 2015  ? ORIF TIBIA FRACTURE Right 10/30/2016  ? Procedure: OPEN REDUCTION INTERNAL FIXATION (ORIF) TIBIA FIBULA FRACTURE;  Surgeon: Altamese Wabeno, MD;  Location: [facility];  Service: Orthopedics;  Laterality: Right;  ? RETINAL LASER PROCEDURE Bilateral   ? "for diabetic retinopathy"  ? ?Patient Active Problem List  ? Diagnosis Date Noted  ? Colon cancer screening 08/01/2021  ? Clinical diagnosis of COVID-19 12/28/2020  ? OAB (overactive bladder) 09/21/2020  ? Conjunctival injection, right 09/21/2020  ? Essential hypertension 09/21/2020  ? Urinary dysfunction 09/21/2020  ? Thoracic outlet syndrome 05/03/2020  ? Controlled type  1 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 ([place]) 03/24/2020  ? Right epiretinal membrane 03/24/2020  ? Retinal exudates and deposits 03/24/2020  ? 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 Nuclear sclerotic cataract of both eyes 03/24/2020  ? Gait disorder 03/23/2020  ? Benign prostatic hyperplasia without lower urinary tract symptoms 03/01/2020  ? Diabetic gastroparesis associated with type 1 diabetes mellitus ([place]) 03/01/2020  ? Uncontrolled type 1 diabetes mellitus with hyperglycemia ([place]) 03/01/2020  ? Gait disturbance 08/19/2019  ? Acquired diplegia ([place]) 08/19/2019  ? Vitamin D insufficiency 11/29/2016  ? Hypogonadism in male 11/01/2016  ? 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[facility])   ? Hypothyroidism   ? Hypertension   ? Abnormal liver function tests 03/06/2013  ? Type I (juvenile type) diabetes mellitus without mention of complication, uncontrolled 12/03/2007  ? HYPERCHOLESTEROLEMIA 11/12/2007  ? MULTIPLE SCLEROSIS 08/29/2007  ? PROLIFERATIVE DIABETIC RETINOPATHY 08/29/2007  ? ? ?REFERRING DIAG: G35 (ICD-10-CM) - Multiple sclerosis ([place]) R26.9 (ICD-10-CM) - Gait disturbance  ? ?THERAPY DIAG:  ?Other abnormalities of gait and mobility ? ?Repeated falls ? ?Muscle weakness (generalized) ? ?Unsteadiness on feet ? ?Foot drop, left ? ?PERTINENT HISTORY: vitamin D insufficiency, Type 1 IDDM w/ polyneuropathy, diabetic retinopathy, hypothyroidism, HTN, hypercholestrolemia, R tibia fracture, L ankle fracture with ORIF, MS with acquired diplegia  ? ?PRECAUTIONS: Fall ? ?SUBJECTIVE: Pt continues to feel  like he cannot tell what effect increased activity the day prior has on his fatigue and physical performance. ? ?PAIN:  ?Are you having pain? No ? ?TODAY'S TREATMENT: ?SciFit L2.5 x13mns using BLE only w/ strap around thigh to maintain proper LE alignment as bil knees fall outward, cued for ROM into knee extension just prior to feeling of knee locking out to focus on quad  control and reciprocal movement of LE. ?L [place] AFO donned in sitting prior to gait training in gym.  Pt completes 258 w/ RW and supervision.  He is cued to weight shift onto LLE to unlock knee then shift weight to RLE to swing leg forward with improved maintenance on soft knee bend during weight bearing.  He ambulates with increased time due to cuing for decreased UE use and to promote reciprocal LE movement.  His L knee continues to hyperextend in midstance despite cuing and improved unlocking.  Left foot continues to intermittently scuff floor.  He requires less standing recovery today. ?In // bars:  pt performs lunges to LLE in modified ROM using BUE support gliding on bars w/ emphasis on quad control>adv/retreat to dot w/ inc UE use on bars>adv/retreat over 2" foam with inc difficulty lifting and clearing hurdle, PT provides minA to promote knee flexion to clear hurdle with pt demonstrating improved independence with placement. ?  ? ? ?OBJECTIVE:  ?BP at onset of session, LUE in sitting:  138/74 ?DIAGNOSTIC FINDINGS: No new relevant imaging.  Upcoming MRI of cervical spine and brain, date unavailable. ?  ?COGNITION: ?Overall cognitive status: Within functional limits for tasks assessed ?            ?SENSATION: ?Light touch: WFL ?Hot/Cold: WFL-no difference in shower ?Proprioception: WFL-tested finger/ankle ?  ?COORDINATION: ?Unable to formally assess due to weakness ?  ?EDEMA:  ?None noted in bilateral LE/UE ?  ?MUSCLE TONE: mild catch and release BLE midway through ROM into hip and knee extension. ?  ?  ?MUSCLE LENGTH: ?Hamstrings: Right 45 deg; Left 30 deg ?  ?DTRs:  ?Patella 2+ = Normal-L normal, R trace (1) ?  ?POSTURE: No Significant postural limitations ?  ?LE ROM:    ?  ?Passive  Right ?08/14/2021 Left ?08/14/2021  ?Hip flexion      ?Hip extension      ?Hip abduction      ?Hip adduction      ?Hip internal rotation      ?Hip external rotation      ?Knee flexion      ?Knee extension      ?Ankle  dorsiflexion      ?Ankle plantarflexion      ?Ankle inversion      ?Ankle eversion      ? (Blank rows = not tested) ?  ?MMT:   ?  ?MMT Right ?08/14/2021 Left ?08/14/2021  ?Hip flexion      ?Hip extension      ?Hip abduction      ?Hip adduction      ?Hip internal rotation      ?Hip external rotation      ?Knee flexion      ?Knee extension      ?Ankle dorsiflexion      ?Ankle plantarflexion      ?Ankle inversion      ?Ankle eversion      ?(Blank rows = not tested) ?  ?BED MOBILITY:  ?Sit to supine Complete Independence ?Supine to sit Complete Independence ?Pt rolls from prone to  supine as he climbs onto mat in quadruped and lowers to stomach prior to rolling. ?  ?TRANSFERS: ?Assistive device utilized: Environmental consultant - 2 wheeled  ?Sit to stand: Modified independence, SBA, CGA, and Min A ?Stand to sit: Modified independence, SBA, and CGA ?Chair to chair: Modified independence, SBA, and CGA ?  ?GAIT: ?Gait pattern: step to pattern, decreased step length- Right, decreased step length- Left, decreased stance time- Right, decreased stance time- Left, decreased stride length, decreased hip/knee flexion- Right, decreased hip/knee flexion- Left, decreased ankle dorsiflexion- Right, decreased ankle dorsiflexion- Left, Left hip hike, Right foot flat, genu recurvatum- Right, genu recurvatum- Left, narrow BOS, and poor foot clearance- Left ?Distance walked: 60' ?Assistive device utilized: Environmental consultant - 2 wheeled ?Level of assistance: SBA ?Comments: Pt requires several standing rests due to increased UE use on RW, pt intermittently hops using RW with touching LE to ground.  When LLE fatigues at end of 75' in gym on level surface pt demonstrates increased L foot drop with toe drag.  He has good awareness of need to stop ambulating to prevent fall due to tripping. ?  ?FUNCTIONAL TESTs:  ?5 times sit to stand: 27.57sec ? ?  ?  ?PATIENT EDUCATION: ?Education details: Addition to HEP. ?Person educated: Patient ?Education method: Explanation ?Education  comprehension: verbalized understanding ?  ?  ?HOME EXERCISE PROGRAM: ?Code from prior episode of care 11/24/2020 (pt still follows):  [email or web address] ?Access Code: [email or web address] ?URL: [email or web address]

## 2021-09-06 ENCOUNTER — Ambulatory Visit: Payer: Medicare Other | Admitting: Physical Therapy

## 2021-09-06 ENCOUNTER — Encounter: Payer: Self-pay | Admitting: Physical Therapy

## 2021-09-06 DIAGNOSIS — R2689 Other abnormalities of gait and mobility: Secondary | ICD-10-CM | POA: Diagnosis not present

## 2021-09-06 DIAGNOSIS — M6281 Muscle weakness (generalized): Secondary | ICD-10-CM

## 2021-09-06 DIAGNOSIS — R296 Repeated falls: Secondary | ICD-10-CM

## 2021-09-06 DIAGNOSIS — R2681 Unsteadiness on feet: Secondary | ICD-10-CM

## 2021-09-06 DIAGNOSIS — M21372 Foot drop, left foot: Secondary | ICD-10-CM

## 2021-09-06 NOTE — Therapy (Addendum)
?OUTPATIENT PHYSICAL THERAPY TREATMENT NOTE ? ? ?Patient Name: Johnathan Ross ?MRN: [id number] ?DOB:Apr 19, 1971, 51 y.o., male ?Today's Date: 09/06/2021 ? ?PCP: Janith Lima, MD ?REFERRING PROVIDER: Janith Lima, MD ? ? PT End of Session - 09/06/21 1154   ? ? Visit Number 7   ? Number of Visits 17   ? Date for PT Re-Evaluation 10/13/21   ? Authorization Type Medicare A&B  Patient pays 20% of visit cost  Follow medicare guidelines  VL: [place]   ? Progress Note Due on Visit 10   ? PT Start Time 1148   ? PT Stop Time 1230   ? PT Time Calculation (min) 42 min   ? Equipment Utilized During Treatment Gait belt   ? Activity Tolerance Patient tolerated treatment well   ? Behavior During Therapy [facility] for tasks assessed/performed   ? ?  ?  ? ?  ? ? ? ?Past Medical History:  ?Diagnosis Date  ? Hyperlipidemia   ? Hypertension   ? Hypogonadism in male 11/01/2016  ? Hypothyroidism   ? Multiple sclerosis ([place])   ? Proliferative diabetic retinopathy(362.02)   ? Type 1 diabetes mellitus ([place]) dx'd 1994  ? Vitamin D insufficiency 11/29/2016  ? ?Past Surgical History:  ?Procedure Laterality Date  ? EYE SURGERY Bilateral   ? "laser for diabetic retinopathy"  ? FRACTURE SURGERY    ? IR VENO/EXT/UNI LEFT  03/24/2020  ? OPEN REDUCTION INTERNAL FIXATION (ORIF) TIBIA/FIBULA FRACTURE Right 10/30/2016  ? ORIF ANKLE FRACTURE Left 2015  ? ORIF TIBIA FRACTURE Right 10/30/2016  ? Procedure: OPEN REDUCTION INTERNAL FIXATION (ORIF) TIBIA FIBULA FRACTURE;  Surgeon: Altamese [place], MD;  Location: [facility];  Service: Orthopedics;  Laterality: Right;  ? RETINAL LASER PROCEDURE Bilateral   ? "for diabetic retinopathy"  ? ?Patient Active Problem List  ? Diagnosis Date Noted  ? Colon cancer screening 08/01/2021  ? Clinical diagnosis of COVID-19 12/28/2020  ? OAB (overactive bladder) 09/21/2020  ? Conjunctival injection, right 09/21/2020  ? Essential hypertension 09/21/2020  ? Urinary dysfunction 09/21/2020  ? Thoracic outlet syndrome 05/03/2020  ? Controlled type  1 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 ([place]) 03/24/2020  ? Right epiretinal membrane 03/24/2020  ? Retinal exudates and deposits 03/24/2020  ? 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 Nuclear sclerotic cataract of both eyes 03/24/2020  ? Gait disorder 03/23/2020  ? Benign prostatic hyperplasia without lower urinary tract symptoms 03/01/2020  ? Diabetic gastroparesis associated with type 1 diabetes mellitus ([place]) 03/01/2020  ? Uncontrolled type 1 diabetes mellitus with hyperglycemia ([place]) 03/01/2020  ? Gait disturbance 08/19/2019  ? Acquired diplegia ([place]) 08/19/2019  ? Vitamin D insufficiency 11/29/2016  ? Hypogonadism in male 11/01/2016  ? 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[facility])   ? Hypothyroidism   ? Hypertension   ? Abnormal liver function tests 03/06/2013  ? Type I (juvenile type) diabetes mellitus without mention of complication, uncontrolled 12/03/2007  ? HYPERCHOLESTEROLEMIA 11/12/2007  ? MULTIPLE SCLEROSIS 08/29/2007  ? PROLIFERATIVE DIABETIC RETINOPATHY 08/29/2007  ? ? ?REFERRING DIAG: G35 (ICD-10-CM) - Multiple sclerosis ([place]) R26.9 (ICD-10-CM) - Gait disturbance  ? ?THERAPY DIAG:  ?Other abnormalities of gait and mobility ? ?Repeated falls ? ?Muscle weakness (generalized) ? ?Unsteadiness on feet ? ?Foot drop, left ? ?PERTINENT HISTORY: vitamin D insufficiency, Type 1 IDDM w/ polyneuropathy, diabetic retinopathy, hypothyroidism, HTN, hypercholestrolemia, R tibia fracture, L ankle fracture with ORIF, MS with acquired diplegia  ? ?PRECAUTIONS: Fall ? ?SUBJECTIVE: Pt states yesterday he  was tired after trying to work on [place] in shop following PT.   ? ?PAIN:  ?Are you having pain? No ? ?TODAY'S TREATMENT: ?Pt ambulates 120' into clinic w/ RW supervision level, inc time used to allow for dec UE reliance on AD with inc weight bearing through BLE. ?Supine IR/ER x15, second set x12, noted dec ROM w/ inc reps - added to HEP ?Rest x2 mins following to  promote inc muscle recruitment on following activity. ?Hamstring curls 4x4 using small bolster under ankle w/ therapist using belt under distal thigh to lift to promote knee unlocking on R greater than left ?STS from EOM using 2" step under alt LE 2x4>prolonged holds 3x26mn each LE elevated with tactile cues for even weight distribution maintain unlocked knee position w/o UE support, pt cued through weight shift to unlock knee, requiring intermittent minA at L knee to unlock prior to hold ?Cooldown with the SciFit at L2.0 BLE only x653ms no strap around thigh as pt better able to control medial rotation of knee during quad activation during initial 4 mins requiring some manual assist to maintain internal rotation to keep R foot flat with AFO. ? ? ?  ? ? ?OBJECTIVE:  ?BP at onset of session, LUE in sitting:  138/74 ?DIAGNOSTIC FINDINGS: No new relevant imaging.  Upcoming MRI of cervical spine and brain, date unavailable. ?  ?COGNITION: ?Overall cognitive status: Within functional limits for tasks assessed ?            ?SENSATION: ?Light touch: WFL ?Hot/Cold: WFL-no difference in shower ?Proprioception: WFL-tested finger/ankle ?  ?COORDINATION: ?Unable to formally assess due to weakness ?  ?EDEMA:  ?None noted in bilateral LE/UE ?  ?MUSCLE TONE: mild catch and release BLE midway through ROM into hip and knee extension. ?  ?  ?MUSCLE LENGTH: ?Hamstrings: Right 45 deg; Left 30 deg ?  ?DTRs:  ?Patella 2+ = Normal-L normal, R trace (1) ?  ?POSTURE: No Significant postural limitations ?  ?LE ROM:    ?  ?Passive  Right ?08/14/2021 Left ?08/14/2021  ?Hip flexion      ?Hip extension      ?Hip abduction      ?Hip adduction      ?Hip internal rotation      ?Hip external rotation      ?Knee flexion      ?Knee extension      ?Ankle dorsiflexion      ?Ankle plantarflexion      ?Ankle inversion      ?Ankle eversion      ? (Blank rows = not tested) ?  ?MMT:   ?  ?MMT Right ?08/14/2021 Left ?08/14/2021  ?Hip flexion      ?Hip extension       ?Hip abduction      ?Hip adduction      ?Hip internal rotation      ?Hip external rotation      ?Knee flexion      ?Knee extension      ?Ankle dorsiflexion      ?Ankle plantarflexion      ?Ankle inversion      ?Ankle eversion      ?(Blank rows = not tested) ?  ?BED MOBILITY:  ?Sit to supine Complete Independence ?Supine to sit Complete Independence ?Pt rolls from prone to supine as he climbs onto mat in quadruped and lowers to stomach prior to rolling. ?  ?TRANSFERS: ?Assistive device utilized: WaEnvironmental consultant 2 wheeled  ?Sit to stand: Modified  independence, SBA, CGA, and Min A ?Stand to sit: Modified independence, SBA, and CGA ?Chair to chair: Modified independence, SBA, and CGA ?  ?GAIT: ?Gait pattern: step to pattern, decreased step length- Right, decreased step length- Left, decreased stance time- Right, decreased stance time- Left, decreased stride length, decreased hip/knee flexion- Right, decreased hip/knee flexion- Left, decreased ankle dorsiflexion- Right, decreased ankle dorsiflexion- Left, Left hip hike, Right foot flat, genu recurvatum- Right, genu recurvatum- Left, narrow BOS, and poor foot clearance- Left ?Distance walked: 56' ?Assistive device utilized: Environmental consultant - 2 wheeled ?Level of assistance: SBA ?Comments: Pt requires several standing rests due to increased UE use on RW, pt intermittently hops using RW with touching LE to ground.  When LLE fatigues at end of 75' in gym on level surface pt demonstrates increased L foot drop with toe drag.  He has good awareness of need to stop ambulating to prevent fall due to tripping. ?  ?FUNCTIONAL TESTs:  ?5 times sit to stand: 27.57sec ? ?  ?  ?PATIENT EDUCATION: ?Education details: Addition to HEP. ?Person educated: Patient ?Education method: Explanation ?Education comprehension: verbalized understanding ?  ?  ?HOME EXERCISE PROGRAM: ?Code from prior episode of care 11/24/2020 (pt still follows):  [email or web address] ?Access Code: [email or web address] ?URL:  [email or web address] ?Date: 08/30/2021 ?Prepared by: Elease Etienne ? ?Exercises ?- Child's Pose Stretch  - 1 x daily - 7 x weekly - 3 sets - 20 second hold ?- Quadruped Hip Extension Kicks  - 1 x daily - 5 x weekl

## 2021-09-06 NOTE — Patient Instructions (Signed)
HEP Code:  [email or web address] ?- Supine Hip Internal and External Rotation  - 1 x daily - 7 x weekly - 2 sets - 12 reps ?

## 2021-09-07 ENCOUNTER — Ambulatory Visit: Payer: Medicare Other

## 2021-09-07 ENCOUNTER — Ambulatory Visit (INDEPENDENT_AMBULATORY_CARE_PROVIDER_SITE_OTHER): Payer: Medicare Other

## 2021-09-07 DIAGNOSIS — Z0000 Encounter for general adult medical examination without abnormal findings: Secondary | ICD-10-CM

## 2021-09-07 NOTE — Patient Instructions (Signed)
Johnathan Ross , ?Thank you for taking time to come for your Medicare Wellness Visit. I appreciate your ongoing commitment to your health goals. Please review the following plan we discussed and let me know if I can assist you in the future.  ? ?Screening recommendations/referrals: ?Colonoscopy: order placed 07/2021 ?Recommended yearly ophthalmology/optometry visit for glaucoma screening and checkup ?Recommended yearly dental visit for hygiene and checkup ? ?Vaccinations: ?Influenza vaccine: 05/24/2021 ?Pneumococcal vaccine: 03/01/2020 ?Tdap vaccine: declined ?Shingles vaccine: not of age   ?Covid-19:01/31/2020, 02/28/2020 ? ?Advanced directives: No ? ?Conditions/risks identified: Yes ? ?Next appointment: Please schedule your next Medicare Wellness Visit with your Nurse Health Advisor in 1 year by calling [phone]. ? ?Preventive Care 40-64 Years, Male ?Preventive care refers to lifestyle choices and visits with your health care provider that can promote health and wellness. ?What does preventive care include? ?A yearly physical exam. This is also called an annual well check. ?Dental exams once or twice a year. ?Routine eye exams. Ask your health care provider how often you should have your eyes checked. ?Personal lifestyle choices, including: ?Daily care of your teeth and gums. ?Regular physical activity. ?Eating a healthy diet. ?Avoiding tobacco and drug use. ?Limiting alcohol use. ?Practicing safe sex. ?Taking low-dose aspirin every day starting at age 77. ?What happens during an annual well check? ?The services and screenings done by your health care provider during your annual well check will depend on your age, overall health, lifestyle risk factors, and family history of disease. ?Counseling  ?Your health care provider may ask you questions about your: ?Alcohol use. ?Tobacco use. ?Drug use. ?Emotional well-being. ?Home and relationship well-being. ?Sexual activity. ?Eating habits. ?Work and work  Statistician. ?Screening  ?You may have the following tests or measurements: ?Height, weight, and BMI. ?Blood pressure. ?Lipid and cholesterol levels. These may be checked every 5 years, or more frequently if you are over 2 years old. ?Skin check. ?Lung cancer screening. You may have this screening every year starting at age 41 if you have a 30-pack-year history of smoking and currently smoke or have quit within the past 15 years. ?Fecal occult blood test (FOBT) of the stool. You may have this test every year starting at age 35. ?Flexible sigmoidoscopy or colonoscopy. You may have a sigmoidoscopy every 5 years or a colonoscopy every 10 years starting at age 5. ?Prostate cancer screening. Recommendations will vary depending on your family history and other risks. ?Hepatitis C blood test. ?Hepatitis B blood test. ?Sexually transmitted disease (STD) testing. ?Diabetes screening. This is done by checking your blood sugar (glucose) after you have not eaten for a while (fasting). You may have this done every 1-3 years. ?Discuss your test results, treatment options, and if necessary, the need for more tests with your health care provider. ?Vaccines  ?Your health care provider may recommend certain vaccines, such as: ?Influenza vaccine. This is recommended every year. ?Tetanus, diphtheria, and acellular pertussis (Tdap, Td) vaccine. You may need a Td booster every 10 years. ?Zoster vaccine. You may need this after age 7. ?Pneumococcal 13-valent conjugate (PCV13) vaccine. You may need this if you have certain conditions and have not been vaccinated. ?Pneumococcal polysaccharide (PPSV23) vaccine. You may need one or two doses if you smoke cigarettes or if you have certain conditions. ?Talk to your health care provider about which screenings and vaccines you need and how often you need them. ?This information is not intended to replace advice given to you by your health care provider. Make sure you  discuss any questions you  have with your health care provider. ?Document Released: 06/10/2015 Document Revised: 02/01/2016 Document Reviewed: 03/15/2015 ?Elsevier Interactive Patient Education ? 2017 [place]. ? ?Fall Prevention in the Home ?Falls can cause injuries. They can happen to people of all ages. There are many things you can do to make your home safe and to help prevent falls. ?What can I do on the outside of my home? ?Regularly fix the edges of walkways and driveways and fix any cracks. ?Remove anything that might make you trip as you walk through a door, such as a raised step or threshold. ?Trim any bushes or trees on the path to your home. ?Use bright outdoor lighting. ?Clear any walking paths of anything that might make someone trip, such as rocks or tools. ?Regularly check to see if handrails are loose or broken. Make sure that both sides of any steps have handrails. ?Any raised decks and porches should have guardrails on the edges. ?Have any leaves, snow, or ice cleared regularly. ?Use sand or salt on walking paths during winter. ?Clean up any spills in your garage right away. This includes oil or grease spills. ?What can I do in the bathroom? ?Use night lights. ?Install grab bars by the toilet and in the tub and shower. Do not use towel bars as grab bars. ?Use non-skid mats or decals in the tub or shower. ?If you need to sit down in the shower, use a plastic, non-slip stool. ?Keep the floor dry. Clean up any water that spills on the floor as soon as it happens. ?Remove soap buildup in the tub or shower regularly. ?Attach bath mats securely with double-sided non-slip rug tape. ?Do not have throw rugs and other things on the floor that can make you trip. ?What can I do in the bedroom? ?Use night lights. ?Make sure that you have a light by your bed that is easy to reach. ?Do not use any sheets or blankets that are too big for your bed. They should not hang down onto the floor. ?Have a firm chair that has side arms. You can  use this for support while you get dressed. ?Do not have throw rugs and other things on the floor that can make you trip. ?What can I do in the kitchen? ?Clean up any spills right away. ?Avoid walking on wet floors. ?Keep items that you use a lot in easy-to-reach places. ?If you need to reach something above you, use a strong step stool that has a grab bar. ?Keep electrical cords out of the way. ?Do not use floor polish or wax that makes floors slippery. If you must use wax, use non-skid floor wax. ?Do not have throw rugs and other things on the floor that can make you trip. ?What can I do with my stairs? ?Do not leave any items on the stairs. ?Make sure that there are handrails on both sides of the stairs and use them. Fix handrails that are broken or loose. Make sure that handrails are as long as the stairways. ?Check any carpeting to make sure that it is firmly attached to the stairs. Fix any carpet that is loose or worn. ?Avoid having throw rugs at the top or bottom of the stairs. If you do have throw rugs, attach them to the floor with carpet tape. ?Make sure that you have a light switch at the top of the stairs and the bottom of the stairs. If you do not have them, ask someone to  add them for you. ?What else can I do to help prevent falls? ?Wear shoes that: ?Do not have high heels. ?Have rubber bottoms. ?Are comfortable and fit you well. ?Are closed at the toe. Do not wear sandals. ?If you use a stepladder: ?Make sure that it is fully opened. Do not climb a closed stepladder. ?Make sure that both sides of the stepladder are locked into place. ?Ask someone to hold it for you, if possible. ?Clearly mark and make sure that you can see: ?Any grab bars or handrails. ?First and last steps. ?Where the edge of each step is. ?Use tools that help you move around (mobility aids) if they are needed. These include: ?Canes. ?Walkers. ?Scooters. ?Crutches. ?Turn on the lights when you go into a dark area. Replace any light  bulbs as soon as they burn out. ?Set up your furniture so you have a clear path. Avoid moving your furniture around. ?If any of your floors are uneven, fix them. ?If there are any pets around you, be aware of where they

## 2021-09-07 NOTE — Progress Notes (Signed)
?I connected with Johnathan Ross today by telephone and verified that I am speaking with the correct person using two identifiers. ?Location patient: home ?Location provider: work ?Persons participating in the virtual visit: patient, provider. ?  ?I discussed the limitations, risks, security and privacy concerns of performing an evaluation and management service by telephone and the availability of in person appointments. I also discussed with the patient that there may be a patient responsible charge related to this service. The patient expressed understanding and verbally consented to this telephonic visit.  ?  ?Interactive audio and video telecommunications were attempted between this provider and patient, however failed, due to patient having technical difficulties OR patient did not have access to video capability.  We continued and completed visit with audio only. ? ?Some vital signs may be absent or patient reported.  ? ?Time Spent with patient on telephone encounter: 30 minutes ? ?Subjective:  ? Johnathan Ross is a 51 y.o. male who presents for an Initial Medicare Annual Wellness Visit. ? ?Review of Systems    ? ?Cardiac Risk Factors include: diabetes mellitus;hypertension;dyslipidemia;male gender ? ?   ?Objective:  ?  ?There were no vitals filed for this visit. ?There is no height or weight on file to calculate BMI. ? ? ?  09/07/2021  ?  3:29 PM 08/14/2021  ? 10:40 AM 08/03/2020  ?  8:47 AM 04/06/2019  ? 10:23 AM 01/30/2019  ?  8:55 AM 03/28/2018  ?  8:52 AM 12/24/2016  ? 12:41 PM  ?Advanced Directives  ?Does Patient Have a Medical Advance Directive? No No No No No No No  ?Would patient like information on creating a medical advance directive? No - Patient declined No - Patient declined No - Patient declined No - Patient declined   No - Patient declined  ? ? ?Current Medications (verified) ?Outpatient Encounter Medications as of 09/07/2021  ?Medication Sig  ? amoxicillin-clavulanate (AUGMENTIN) 875-125 MG tablet  Take 1 tablet by mouth 2 (two) times daily. (Patient not taking: Reported on 08/14/2021)  ? baclofen (LIORESAL) 10 MG tablet Take one or two pills three times a day (up to 6 pills/day)  ? Continuous Blood Gluc Receiver (DEXCOM G5 RECEIVER KIT) DEVI by Does not apply route. (Patient not taking: Reported on 08/14/2021)  ? Continuous Blood Gluc Sensor (DEXCOM G6 SENSOR) MISC Use as directed. (Patient not taking: Reported on 08/14/2021)  ? Continuous Blood Gluc Transmit (DEXCOM G6 TRANSMITTER) MISC Use to monitor blood sugar (Patient not taking: Reported on 08/14/2021)  ? dalfampridine (AMPYRA) 10 MG TB12 Take 1 tablet (10 mg total) by mouth 2 (two) times daily. (Patient not taking: Reported on 08/24/2021)  ? Glucagon (GVOKE HYPOPEN 2-PACK) 1 MG/0.2ML SOAJ Inject 1 Act into the skin daily as needed.  ? glucose blood (CONTOUR NEXT TEST) test strip USE 2 TIMES PER DAY. E10.65  ? losartan (COZAAR) 50 MG tablet TAKE 1 TABLET BY MOUTH EVERY DAY  ? NOVOLOG 100 UNIT/ML injection USE UP TO 80 UNITS IN INSULIN PUMP DAILY-DX CODE E10.8  ? simvastatin (ZOCOR) 20 MG tablet TAKE 1 TABLET BY MOUTH EVERYDAY AT BEDTIME  ? solifenacin (VESICARE) 5 MG tablet TAKE 1 TABLET (5 MG TOTAL) BY MOUTH DAILY. (Patient not taking: Reported on 08/24/2021)  ? SYNTHROID 125 MCG tablet TAKE 1 TABLET BY MOUTH 6 DAYS PER WEEK  ? ?No facility-administered encounter medications on file as of 09/07/2021.  ? ? ?Allergies (verified) ?Lisinopril  ? ?History: ?Past Medical History:  ?Diagnosis Date  ? Hyperlipidemia   ?  Hypertension   ? Hypogonadism in male 11/01/2016  ? Hypothyroidism   ? Multiple sclerosis ([place])   ? Proliferative diabetic retinopathy(362.02)   ? Type 1 diabetes mellitus ([place]) dx'd 1994  ? Vitamin D insufficiency 11/29/2016  ? ?Past Surgical History:  ?Procedure Laterality Date  ? EYE SURGERY Bilateral   ? "laser for diabetic retinopathy"  ? FRACTURE SURGERY    ? IR VENO/EXT/UNI LEFT  03/24/2020  ? OPEN REDUCTION INTERNAL FIXATION (ORIF) TIBIA/FIBULA  FRACTURE Right 10/30/2016  ? ORIF ANKLE FRACTURE Left 2015  ? ORIF TIBIA FRACTURE Right 10/30/2016  ? Procedure: OPEN REDUCTION INTERNAL FIXATION (ORIF) TIBIA FIBULA FRACTURE;  Surgeon: Altamese Sil[place], MD;  Location: [facility];  Service: Orthopedics;  Laterality: Right;  ? RETINAL LASER PROCEDURE Bilateral   ? "for diabetic retinopathy"  ? ?Family History  ?Problem Relation Age of Onset  ? Diabetes Sister   ? Colon cancer Maternal Grandmother   ? Colon polyps Maternal Uncle   ? ?Social History  ? ?Socioeconomic History  ? Marital status: Single  ?  Spouse name: 12  ? Number of children: 1  ? Years of education: Not on file  ? Highest education level: Not on file  ?Occupational History  ? Occupation: unemployed  ?Tobacco Use  ? Smoking status: Never  ? Smokeless tobacco: Never  ?Vaping Use  ? Vaping Use: Never used  ?Substance and Sexual Activity  ? Alcohol use: No  ? Drug use: No  ? Sexual activity: Not on file  ?Other Topics Concern  ? Not on file  ?Social History Narrative  ? Right handed  ? No caffeine   ? Lives with girlfriend  ? ?Social Determinants of Health  ? ?Financial Resource Strain: Low Risk   ? Difficulty of Paying Living Expenses: Not hard at all  ?Food Insecurity: No Food Insecurity  ? Worried About Charity fundraiser in the Last Year: Never true  ? Ran Out of Food in the Last Year: Never true  ?Transportation Needs: No Transportation Needs  ? Lack of Transportation (Medical): No  ? Lack of Transportation (Non-Medical): No  ?Physical Activity: Sufficiently Active  ? Days of Exercise per Week: 5 days  ? Minutes of Exercise per Session: 30 min  ?Stress: No Stress Concern Present  ? Feeling of Stress : Not at all  ?Social Connections: Moderately Isolated  ? Frequency of Communication with Friends and Family: More than three times a week  ? Frequency of Social Gatherings with Friends and Family: More than three times a week  ? Attends Religious Services: Never  ? Active Member of Clubs or Organizations: No  ?  Attends Archivist Meetings: Never  ? Marital Status: Living with partner  ? ? ?Tobacco Counseling ?Counseling given: Not Answered ? ? ?Clinical Intake: ? ?Pre-visit preparation completed: Yes ? ?Pain : No/denies pain ? ?  ? ?Nutritional Risks: None ?Diabetes: Yes ?CBG done?: No ?Did pt. bring in CBG monitor from home?: No ? ?How often do you need to have someone help you when you read instructions, pamphlets, or other written materials from your doctor or pharmacy?: 1 - Never ?What is the last grade level you completed in school?: HS[employer] ? ?Diabetic? yrs ? ?Interpreter Needed?: No ? ?Information entered by :: Daneil Dan, LPN ? ? ?Activities of Daily Living ? ?  09/07/2021  ?  3:30 PM  ?In your present state of health, do you have any difficulty performing the following activities:  ?Hearing? 0  ?  Vision? 0  ?Difficulty concentrating or making decisions? 0  ?Walking or climbing stairs? 0  ?Dressing or bathing? 0  ?Doing errands, shopping? 0  ?Preparing Food and eating ? N  ?Using the Toilet? N  ?In the past six months, have you accidently leaked urine? N  ?Do you have problems with loss of bowel control? N  ?Managing your Medications? N  ?Managing your Finances? N  ?Housekeeping or managing your Housekeeping? N  ? ? ?Patient Care Team: ?Janith Lima, MD as PCP - General (Internal Medicine) ?Ellwood Dense., MD as Referring Physician (Neurology) ? ?Indicate any recent Medical Services you may have received from other than [facility] providers in the past year (date may be approximate). ? ?   ?Assessment:  ? This is a routine wellness examination for Alric. ? ?Hearing/Vision screen ?Hearing Screening - Comments:: Patient denied any hearing difficulty.   ?Vision Screening - Comments:: Patient does wear corrective lenses/contacts.  ?Eye exam done by: Deloria Lair, MD. ? ? ?Dietary issues and exercise activities discussed: ?Current Exercise Habits: Home exercise routine, Type of exercise: Other - see comments  (physical therapy exercises every day), Time (Minutes): 30, Frequency (Times/Week): 5, Weekly Exercise (Minutes/Week): 150, Intensity: Moderate, Exercise limited by: neurologic condition(s);orthopedic con

## 2021-09-11 ENCOUNTER — Other Ambulatory Visit: Payer: Self-pay | Admitting: Internal Medicine

## 2021-09-11 ENCOUNTER — Encounter: Payer: Self-pay | Admitting: Physical Therapy

## 2021-09-11 ENCOUNTER — Ambulatory Visit: Payer: Medicare Other | Admitting: Physical Therapy

## 2021-09-11 DIAGNOSIS — R296 Repeated falls: Secondary | ICD-10-CM

## 2021-09-11 DIAGNOSIS — R2689 Other abnormalities of gait and mobility: Secondary | ICD-10-CM

## 2021-09-11 DIAGNOSIS — N3281 Overactive bladder: Secondary | ICD-10-CM

## 2021-09-11 DIAGNOSIS — M6281 Muscle weakness (generalized): Secondary | ICD-10-CM

## 2021-09-11 DIAGNOSIS — R2681 Unsteadiness on feet: Secondary | ICD-10-CM

## 2021-09-11 DIAGNOSIS — M21372 Foot drop, left foot: Secondary | ICD-10-CM

## 2021-09-11 NOTE — Therapy (Signed)
?OUTPATIENT PHYSICAL THERAPY TREATMENT NOTE ? ? ?Patient Name: Johnathan Ross ?MRN: [id number] ?DOB:Sep 26, 1970, 51 y.o., male ?Today's Date: 09/11/2021 ? ?PCP: Janith Lima, MD ?REFERRING PROVIDER: Janith Lima, MD ? ? PT End of Session - 09/11/21 1146   ? ? Visit Number 8   ? Number of Visits 17   ? Date for PT Re-Evaluation 10/13/21   ? Authorization Type Medicare A&B  Patient pays 20% of visit cost  Follow medicare guidelines  VL: [place]   ? Progress Note Due on Visit 10   ? PT Start Time 1140   ? PT Stop Time 1230   ? PT Time Calculation (min) 50 min   ? Equipment Utilized During Treatment Gait belt   ? Activity Tolerance Patient tolerated treatment well   ? Behavior During Therapy [facility] for tasks assessed/performed   ? ?  ?  ? ?  ? ? ? ?Past Medical History:  ?Diagnosis Date  ? Hyperlipidemia   ? Hypertension   ? Hypogonadism in male 11/01/2016  ? Hypothyroidism   ? Multiple sclerosis ([place])   ? Proliferative diabetic retinopathy(362.02)   ? Type 1 diabetes mellitus ([place]) dx'd 1994  ? Vitamin D insufficiency 11/29/2016  ? ?Past Surgical History:  ?Procedure Laterality Date  ? EYE SURGERY Bilateral   ? "laser for diabetic retinopathy"  ? FRACTURE SURGERY    ? IR VENO/EXT/UNI LEFT  03/24/2020  ? OPEN REDUCTION INTERNAL FIXATION (ORIF) TIBIA/FIBULA FRACTURE Right 10/30/2016  ? ORIF ANKLE FRACTURE Left 2015  ? ORIF TIBIA FRACTURE Right 10/30/2016  ? Procedure: OPEN REDUCTION INTERNAL FIXATION (ORIF) TIBIA FIBULA FRACTURE;  Surgeon: Altamese Lely, MD;  Location: [place];  Service: Orthopedics;  Laterality: Right;  ? RETINAL LASER PROCEDURE Bilateral   ? "for diabetic retinopathy"  ? ?Patient Active Problem List  ? Diagnosis Date Noted  ? Colon cancer screening 08/01/2021  ? Clinical diagnosis of COVID-19 12/28/2020  ? OAB (overactive bladder) 09/21/2020  ? Conjunctival injection, right 09/21/2020  ? Essential hypertension 09/21/2020  ? Urinary dysfunction 09/21/2020  ? Thoracic outlet syndrome 05/03/2020  ? Controlled type  1 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 ([place]) 03/24/2020  ? Right epiretinal membrane 03/24/2020  ? Retinal exudates and deposits 03/24/2020  ? 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 Nuclear sclerotic cataract of both eyes 03/24/2020  ? Gait disorder 03/23/2020  ? Benign prostatic hyperplasia without lower urinary tract symptoms 03/01/2020  ? Diabetic gastroparesis associated with type 1 diabetes mellitus ([place]) 03/01/2020  ? Uncontrolled type 1 diabetes mellitus with hyperglycemia ([place]) 03/01/2020  ? Gait disturbance 08/19/2019  ? Acquired diplegia ([place]) 08/19/2019  ? Vitamin D insufficiency 11/29/2016  ? Hypogonadism in male 11/01/2016  ? 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H[facility])   ? Hypothyroidism   ? Hypertension   ? Abnormal liver function tests 03/06/2013  ? Type I (juvenile type) diabetes mellitus without mention of complication, uncontrolled 12/03/2007  ? HYPERCHOLESTEROLEMIA 11/12/2007  ? MULTIPLE SCLEROSIS 08/29/2007  ? PROLIFERATIVE DIABETIC RETINOPATHY 08/29/2007  ? ? ?REFERRING DIAG: G35 (ICD-10-CM) - Multiple sclerosis ([place]) R26.9 (ICD-10-CM) - Gait disturbance  ? ?THERAPY DIAG:  ?Other abnormalities of gait and mobility ? ?Repeated falls ? ?Muscle weakness (generalized) ? ?Unsteadiness on feet ? ?Foot drop, left ? ?PERTINENT HISTORY: vitamin D insufficiency, Type 1 IDDM w/ polyneuropathy, diabetic retinopathy, hypothyroidism, HTN, hypercholestrolemia, R tibia fracture, L ankle fracture with ORIF, MS with acquired diplegia  ? ?PRECAUTIONS: Fall ? ?SUBJECTIVE: Pt states yesterday he  was tired after trying to work on [place] in shop following PT.   ? ?PAIN:  ?Are you having pain? No ? ?TODAY'S TREATMENT: ?Pt ambulates 130' into clinic w/ RW supervision level using inc time w/ difficulty maintaining adequate LLE clearance w/ severe frequent bouts of foot drop w/o LOB. ?STS at [facility] w/ RUE support into standing, no UE support into sitting w/ cues for  forward lean to engage gluts and quads x12, pt cued to maintain soft knees and to use weight shifting and stepping as ways to achieve this position in standing, pt requires therapist to manually unlock knee x3 in standing during weight shift. ?Pt stands in front of chair facing wall w/ RW in front.  He tosses purple ball at wall while maintaining soft bend in knees, therapist blocks L knee for initial reps.  Continues same activity w/ alt LE elevated on 2" block for emphasis on unilateral quad control.  Completes x30 reps bil LE on floor, and L & R elevated. ?Pt stands in front of bench pushed up against wall w/ Rw in front.  He uses BUE to guide back to wall to perform wall squats w/ dec depth.  Modified RPE of 6/10.  Pt able to hold for 2x30sec, 1x52 sec w/ forward lean and reach to engage gluts.  He requires inc rest between bouts. ?  ?  ? ? ?OBJECTIVE:  ?BP at onset of session, LUE in sitting:  138/74 ?DIAGNOSTIC FINDINGS: No new relevant imaging.  Upcoming MRI of cervical spine and brain, date unavailable. ?  ?COGNITION: ?Overall cognitive status: Within functional limits for tasks assessed ?            ?SENSATION: ?Light touch: WFL ?Hot/Cold: WFL-no difference in shower ?Proprioception: WFL-tested finger/ankle ?  ?COORDINATION: ?Unable to formally assess due to weakness ?  ?EDEMA:  ?None noted in bilateral LE/UE ?  ?MUSCLE TONE: mild catch and release BLE midway through ROM into hip and knee extension. ?  ?  ?MUSCLE LENGTH: ?Hamstrings: Right 45 deg; Left 30 deg ?  ?DTRs:  ?Patella 2+ = Normal-L normal, R trace (1) ?  ?POSTURE: No Significant postural limitations ?  ?LE ROM:    ?  ?Passive  Right ?08/14/2021 Left ?08/14/2021  ?Hip flexion      ?Hip extension      ?Hip abduction      ?Hip adduction      ?Hip internal rotation      ?Hip external rotation      ?Knee flexion      ?Knee extension      ?Ankle dorsiflexion      ?Ankle plantarflexion      ?Ankle inversion      ?Ankle eversion      ? (Blank rows = not  tested) ?  ?MMT:   ?  ?MMT Right ?08/14/2021 Left ?08/14/2021  ?Hip flexion      ?Hip extension      ?Hip abduction      ?Hip adduction      ?Hip internal rotation      ?Hip external rotation      ?Knee flexion      ?Knee extension      ?Ankle dorsiflexion      ?Ankle plantarflexion      ?Ankle inversion      ?Ankle eversion      ?(Blank rows = not tested) ?  ?BED MOBILITY:  ?Sit to supine Complete Independence ?Supine to sit Complete Independence ?Pt rolls  from prone to supine as he climbs onto mat in quadruped and lowers to stomach prior to rolling. ?  ?TRANSFERS: ?Assistive device utilized: Environmental consultant - 2 wheeled  ?Sit to stand: Modified independence, SBA, CGA, and Min A ?Stand to sit: Modified independence, SBA, and CGA ?Chair to chair: Modified independence, SBA, and CGA ?  ?GAIT: ?Gait pattern: step to pattern, decreased step length- Right, decreased step length- Left, decreased stance time- Right, decreased stance time- Left, decreased stride length, decreased hip/knee flexion- Right, decreased hip/knee flexion- Left, decreased ankle dorsiflexion- Right, decreased ankle dorsiflexion- Left, Left hip hike, Right foot flat, genu recurvatum- Right, genu recurvatum- Left, narrow BOS, and poor foot clearance- Left ?Distance walked: 80' ?Assistive device utilized: Environmental consultant - 2 wheeled ?Level of assistance: SBA ?Comments: Pt requires several standing rests due to increased UE use on RW, pt intermittently hops using RW with touching LE to ground.  When LLE fatigues at end of 75' in gym on level surface pt demonstrates increased L foot drop with toe drag.  He has good awareness of need to stop ambulating to prevent fall due to tripping. ?  ?FUNCTIONAL TESTs:  ?5 times sit to stand: 27.57sec ? ?  ?  ?PATIENT EDUCATION: ?Education details: Addition to HEP. ?Person educated: Patient ?Education method: Explanation ?Education comprehension: verbalized understanding ?  ?  ?HOME EXERCISE PROGRAM: ?Code from prior episode of care  11/24/2020 (pt still follows):  [email or web address] ?Access Code: [email or web address] ?URL: [email or web address] ?Date: 08/30/2021 ?Prepared by: Elease Etienne ? ?Exercises ?- Child's Pose Stretch  - 1 x daily -

## 2021-09-13 ENCOUNTER — Encounter: Payer: Self-pay | Admitting: Physical Therapy

## 2021-09-13 ENCOUNTER — Ambulatory Visit: Payer: Medicare Other | Admitting: Physical Therapy

## 2021-09-13 DIAGNOSIS — R296 Repeated falls: Secondary | ICD-10-CM

## 2021-09-13 DIAGNOSIS — R2689 Other abnormalities of gait and mobility: Secondary | ICD-10-CM | POA: Diagnosis not present

## 2021-09-13 DIAGNOSIS — M6281 Muscle weakness (generalized): Secondary | ICD-10-CM

## 2021-09-13 DIAGNOSIS — R2681 Unsteadiness on feet: Secondary | ICD-10-CM

## 2021-09-13 NOTE — Therapy (Signed)
?OUTPATIENT PHYSICAL THERAPY TREATMENT NOTE ? ? ?Patient Name: Johnathan Ross ?MRN: [id number] ?DOB:05-02-1971, 51 y.o., male ?Today's Date: 09/13/2021 ? ?PCP: Janith Lima, MD ?REFERRING PROVIDER: Janith Lima, MD ? ? PT End of Session - 09/13/21 1240   ? ? Visit Number 9   ? Number of Visits 17   ? Date for PT Re-Evaluation 10/13/21   ? Authorization Type Medicare A&B  Patient pays 20% of visit cost  Follow medicare guidelines  VL: [place]   ? Progress Note Due on Visit 10   ? PT Start Time 1233   ? PT Stop Time 1315   ? PT Time Calculation (min) 42 min   ? Equipment Utilized During Treatment Gait belt   ? Activity Tolerance Patient tolerated treatment well   ? Behavior During Therapy [facility] for tasks assessed/performed   ? ?  ?  ? ?  ? ? ? ?Past Medical History:  ?Diagnosis Date  ? Hyperlipidemia   ? Hypertension   ? Hypogonadism in male 11/01/2016  ? Hypothyroidism   ? Multiple sclerosis ([place])   ? Proliferative diabetic retinopathy(362.02)   ? Type 1 diabetes mellitus ([place]) dx'd 1994  ? Vitamin D insufficiency 11/29/2016  ? ?Past Surgical History:  ?Procedure Laterality Date  ? EYE SURGERY Bilateral   ? "laser for diabetic retinopathy"  ? FRACTURE SURGERY    ? IR VENO/EXT/UNI LEFT  03/24/2020  ? OPEN REDUCTION INTERNAL FIXATION (ORIF) TIBIA/FIBULA FRACTURE Right 10/30/2016  ? ORIF ANKLE FRACTURE Left 2015  ? ORIF TIBIA FRACTURE Right 10/30/2016  ? Procedure: OPEN REDUCTION INTERNAL FIXATION (ORIF) TIBIA FIBULA FRACTURE;  Surgeon: Altamese Orient, MD;  Location: [place];  Service: Orthopedics;  Laterality: Right;  ? RETINAL LASER PROCEDURE Bilateral   ? "for diabetic retinopathy"  ? ?Patient Active Problem List  ? Diagnosis Date Noted  ? Colon cancer screening 08/01/2021  ? Clinical diagnosis of COVID-19 12/28/2020  ? OAB (overactive bladder) 09/21/2020  ? Conjunctival injection, right 09/21/2020  ? Essential hypertension 09/21/2020  ? Urinary dysfunction 09/21/2020  ? Thoracic outlet syndrome 05/03/2020  ? Controlled type  1 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 ([place]) 03/24/2020  ? Right epiretinal membrane 03/24/2020  ? Retinal exudates and deposits 03/24/2020  ? 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 Nuclear sclerotic cataract of both eyes 03/24/2020  ? Gait disorder 03/23/2020  ? Benign prostatic hyperplasia without lower urinary tract symptoms 03/01/2020  ? Diabetic gastroparesis associated with type 1 diabetes mellitus ([place]) 03/01/2020  ? Uncontrolled type 1 diabetes mellitus with hyperglycemia ([place]) 03/01/2020  ? Gait disturbance 08/19/2019  ? Acquired diplegia ([place]) 08/19/2019  ? Vitamin D insufficiency 11/29/2016  ? Hypogonadism in male 11/01/2016  ? 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[facility])   ? Hypothyroidism   ? Hypertension   ? Abnormal liver function tests 03/06/2013  ? Type I (juvenile type) diabetes mellitus without mention of complication, uncontrolled 12/03/2007  ? HYPERCHOLESTEROLEMIA 11/12/2007  ? MULTIPLE SCLEROSIS 08/29/2007  ? PROLIFERATIVE DIABETIC RETINOPATHY 08/29/2007  ? ? ?REFERRING DIAG: G35 (ICD-10-CM) - Multiple sclerosis ([place]) R26.9 (ICD-10-CM) - Gait disturbance  ? ?THERAPY DIAG:  ?Other abnormalities of gait and mobility ? ?Repeated falls ? ?Muscle weakness (generalized) ? ?Unsteadiness on feet ? ?PERTINENT HISTORY: vitamin D insufficiency, Type 1 IDDM w/ polyneuropathy, diabetic retinopathy, hypothyroidism, HTN, hypercholestrolemia, R tibia fracture, L ankle fracture with ORIF, MS with acquired diplegia  ? ?PRECAUTIONS: Fall ? ?SUBJECTIVE: No new complaints. No falls or pain to  report.    ? ?PAIN:  ?Are you having pain? No ? ?TODAY'S TREATMENT: ?09/13/2021 ? [facility] PT Assessment - 09/13/21 1245   ? ?  ? Standardized Balance Assessment  ? Standardized Balance Assessment Five Times Sit to Stand   ? Five times sit to stand comments  19.94 seconds with single UE support from surface at height equal to knees.   ? ?  ?  ? ?SELF CARE: ?Discussed HEP. Pt  reports doing the ex's at home. Reviewed pt's HEP in session verbally and noted pt to have 21 ex's on current HEP. Pt may benefit from consolidation of program at next session so not to have pt exacerbate or overdue it at home as he is active outdoors in yard and shed as well.  ? ?GAIT: ?Gait pattern: step through pattern, decreased stride length, decreased hip/knee flexion- Left, decreased ankle dorsiflexion- Right, decreased ankle dorsiflexion- Left, decreased trunk rotation, narrow BOS, and poor foot clearance- Left ?Distance walked: 115 x 1, plus around clinic with session ?Assistive device utilized: Walker - 2 wheeled and right AFO, simulated toe cap on left shoe ?Level of assistance:  min guard assist ?Comments: with use of simulated toe cap on left shoe pt's left foot did not get "stuck" when pt was unable to fully flex hip/knee with swing phase, pt then able to try again without extensor tone kicking in.   ? ?BALANCE/NMR: ?In parallel bars with BOSU blue side up: had pt working on reciprocally "pushing" BOSU away for 4 reps each side working on hip/knee flexion with swing phase. Increased difficulty with minimal movement with left LE pushing.  ?Bil UE support in parallel bars: alternating forward mini lunges for 6 reps each side, then alternating lateral side mini lunges for 6 reps each side. Cues on technique with min guard assist for safety.  ?  ? ? ?  ?  ?PATIENT EDUCATION: ?Education details: progress toward goals; continue with current HEP ?Person educated: Patient ?Education method: Explanation ?Education comprehension: verbalized understanding ?  ?  ?HOME EXERCISE PROGRAM: ?Code from prior episode of care 11/24/2020 (pt still follows):  [email or web address] ?Access Code: [email or web address] ?URL: [email or web address] ?Date: 08/30/2021 ?Prepared by: Elease Etienne ? ?Exercises ?- Child's Pose Stretch  - 1 x daily - 7 x weekly - 3 sets - 20 second hold ?- Quadruped Hip Extension Kicks  - 1 x daily - 5 x  weekly - 4 sets - 5 reps ?- Heel Sits  - 1 x daily - 5 x weekly - 1 sets - 12 reps ?- Tall Kneeling Diagonal Lift with Medicine Ball  - 1 x daily - 5 x weekly - 1 sets - 5-10 reps ?- Supine Thoracic Mobilization Towel Roll Vertical with Arm Stretch  - 1 x daily - 5 x weekly - 1 sets - 2 minutes hold ?- Supine Lower Trunk Rotation  - 1 x daily - 7 x weekly - 3 sets - 10 reps ?- Anterior Shoulder and Biceps Stretch  - 1 x daily - 7 x weekly - 2 sets - 20-30 seconds hold ?- Seated [place] Stretch with Towel  - 1 x daily - 7 x weekly - 2 sets - 30 seconds hold ?- Marching [place]  - 1 x daily - 7 x weekly - 1 sets - 5 reps ?- Clamshell  - 1 x daily - 7 x weekly - 1 sets - 10 reps ?- Seated Gastroc Stretch with Strap  - 1 x daily - 7 x weekly -  2 sets - 60 second hold ?- Seated [place] Stretch  - 1 x daily - 7 x weekly - 2 sets - 60 second  hold ?- Seated Ankle Plantarflexion with Resistance  - 1 x daily - 7 x weekly - 2 sets - 20 reps ?- Ankle Inversion Eversion Towel Slide  - 1 x daily - 7 x weekly - 2 sets - 20 reps ?- Seated Ankle Dorsiflexion AROM  - 1 x daily - 7 x weekly - 2 sets - 20 reps - 5-6 second hold ?- Supine Transversus Abdominis Bracing with Leg Extension  - 1 x daily - 7 x weekly - 3 sets - 10 reps ([place] 2) ?- Supine Hip Internal and External Rotation  - 1 x daily - 7 x weekly - 2 sets - 12 reps ?- Bird dogs w/ modified knee bent position 4x4-cued to rest to facilitate improved ROM and to weight shift onto UE to place hips into slight extension prior to lift ?- L hamstring stretch 2x30sec cued for stretch to comfort ?- Bil quad stretch 2x30sec ?- Tall kneel holding 2 mins ?- Walking program w/ inc weight bearing through legs ?  ?  ?  ?GOALS: ?Goals reviewed with patient? Yes ?  ?SHORT TERM GOALS: Target date:  09/15/2021 ?  ?Pt will be independent with initial functional strength and balance HEP with supervision from girlfriend. ?Baseline: 09/13/21: met with current program ?Goal status: MET ?  ?2.  Pt  will decrease 5xSTS to <25 seconds in order to demonstrate decreased risk for falls and improved functional bilateral LE strength and power. ?Baseline: 09/13/21: 19.94 sec's with single UE support ?Goal statu

## 2021-09-18 ENCOUNTER — Ambulatory Visit: Payer: Medicare Other

## 2021-09-18 DIAGNOSIS — R2689 Other abnormalities of gait and mobility: Secondary | ICD-10-CM | POA: Diagnosis not present

## 2021-09-18 DIAGNOSIS — M6281 Muscle weakness (generalized): Secondary | ICD-10-CM

## 2021-09-18 NOTE — Therapy (Signed)
?OUTPATIENT PHYSICAL THERAPY TREATMENT NOTE/progress note ? ? ?Patient Name: Johnathan Ross ?MRN: [id number] ?DOB:02-25-1971, 51 y.o., male ?Today's Date: 09/18/2021 ? ?PCP: Janith Lima, MD ?REFERRING PROVIDER: Janith Lima, MD ?  Progress Note ? ?Reporting period 08/14/21 to 09/18/21 ? ?See Note below for Objective Data and Assessment of Progress/Goals ? ? ? PT End of Session - 09/18/21 1045   ? ? Visit Number 10   ? Number of Visits 17   ? Date for PT Re-Evaluation 10/13/21   ? Authorization Type Medicare A&B  Patient pays 20% of visit cost  Follow medicare guidelines  VL: [place]   ? Progress Note Due on Visit 10   ? PT Start Time 1041   ? PT Stop Time 7262   ? PT Time Calculation (min) 53 min   ? Equipment Utilized During Treatment --   ? Activity Tolerance Patient tolerated treatment well   ? Behavior During Therapy [facility] for tasks assessed/performed   ? ?  ?  ? ?  ? ? ? ?Past Medical History:  ?Diagnosis Date  ? Hyperlipidemia   ? Hypertension   ? Hypogonadism in male 11/01/2016  ? Hypothyroidism   ? Multiple sclerosis ([place])   ? Proliferative diabetic retinopathy(362.02)   ? Type 1 diabetes mellitus ([place]) dx'd 1994  ? Vitamin D insufficiency 11/29/2016  ? ?Past Surgical History:  ?Procedure Laterality Date  ? EYE SURGERY Bilateral   ? "laser for diabetic retinopathy"  ? FRACTURE SURGERY    ? IR VENO/EXT/UNI LEFT  03/24/2020  ? OPEN REDUCTION INTERNAL FIXATION (ORIF) TIBIA/FIBULA FRACTURE Right 10/30/2016  ? ORIF ANKLE FRACTURE Left 2015  ? ORIF TIBIA FRACTURE Right 10/30/2016  ? Procedure: OPEN REDUCTION INTERNAL FIXATION (ORIF) TIBIA FIBULA FRACTURE;  Surgeon: Altamese Paradise, MD;  Location: [place];  Service: Orthopedics;  Laterality: Right;  ? RETINAL LASER PROCEDURE Bilateral   ? "for diabetic retinopathy"  ? ?Patient Active Problem List  ? Diagnosis Date Noted  ? Colon cancer screening 08/01/2021  ? Clinical diagnosis of COVID-19 12/28/2020  ? OAB (overactive bladder) 09/21/2020  ? Conjunctival injection, right  09/21/2020  ? Essential hypertension 09/21/2020  ? Urinary dysfunction 09/21/2020  ? Thoracic outlet syndrome 05/03/2020  ? Controlled type 1 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 ([place]) 03/24/2020  ? Right epiretinal membrane 03/24/2020  ? Retinal exudates and deposits 03/24/2020  ? 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 Nuclear sclerotic cataract of both eyes 03/24/2020  ? Gait disorder 03/23/2020  ? Benign prostatic hyperplasia without lower urinary tract symptoms 03/01/2020  ? Diabetic gastroparesis associated with type 1 diabetes mellitus ([place]) 03/01/2020  ? Uncontrolled type 1 diabetes mellitus with hyperglycemia ([place]) 03/01/2020  ? Gait disturbance 08/19/2019  ? Acquired diplegia ([place]) 08/19/2019  ? Vitamin D insufficiency 11/29/2016  ? Hypogonadism in male 11/01/2016  ? 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[facility])   ? Hypothyroidism   ? Hypertension   ? Abnormal liver function tests 03/06/2013  ? Type I (juvenile type) diabetes mellitus without mention of complication, uncontrolled 12/03/2007  ? HYPERCHOLESTEROLEMIA 11/12/2007  ? MULTIPLE SCLEROSIS 08/29/2007  ? PROLIFERATIVE DIABETIC RETINOPATHY 08/29/2007  ? ? ?REFERRING DIAG: G35 (ICD-10-CM) - Multiple sclerosis ([place]) R26.9 (ICD-10-CM) - Gait disturbance  ? ?THERAPY DIAG:  ?Other abnormalities of gait and mobility ? ?Muscle weakness (generalized) ? ?PERTINENT HISTORY: vitamin D insufficiency, Type 1 IDDM w/ polyneuropathy, diabetic retinopathy, hypothyroidism, HTN, hypercholestrolemia, R tibia fracture, L ankle fracture with ORIF, MS with acquired  diplegia  ? ?PRECAUTIONS: Fall ? ?SUBJECTIVE: Pt reports that he is doing ok. Does feel that legs lock out more than last year. Only really wears right AFO during therapy. Does use loftstrands some at home. ? ?PAIN:  ?Are you having pain? No ? ?TODAY'S TREATMENT: ?RAMP:  ?Level of Assistance: SBA and CGA ?Assistive device utilized:  bilateral loftstrands and  right AFO ?Ramp Comments:  ? ? ?STAIRS: ? Level of Assistance: SBA ? Stair Negotiation Technique: Step to Pattern ?Alternating Pattern  ?Forwards with Bilateral Rails ? Number of Stairs: 4  ? Height of Stairs: 6  ?Comments: Pt performed step-to/reciprocal pattern with ascent having to pause at times, reciprocal pattern with descent with more ease. ? ?GAIT: ?Gait pattern: step to pattern, step through pattern, decreased step length- Left, decreased stance time- Left, decreased ankle dorsiflexion- Left, and Left hip hike ?Distance walked: 230' ?Assistive device utilized:  bilateral loftstrands, right AFO ?Level of assistance: SBA and CGA ?Comments: Pt was cued to try to keep "soft" knees with gait. Noted most recurvatum was on left at terminal stance. Verbal cues to weight shift over stance leg to try to help more with clearance with PT facilitate weight shift at times. Pt will get a couple good steps on left then tone kicks in and has more trouble with advancing. ? ?Updated HEP and discussed with pt as noted below.  ?  ?PATIENT EDUCATION: ?Education details: Updated HEP removing some old exercises and rearranging so that stretches were listed first. Some exercises are combined together- seated DF stretch with resisted PF on way down, heel sit after child's pose with using weights with rising from heel sit for diagonal lifts. Added standing gastroc stretch. ?Person educated: Patient ?Education method: Explanation ?Education comprehension: verbalized understanding ?  ?  ?HOME EXERCISE PROGRAM: ?Access Code: [email or web address] ?URL: [email or web address] ?Date: 09/18/2021- updated ?Prepared by: Cherly Anderson ? ?Exercises ?- Child's Pose Stretch  - 1 x daily - 7 x weekly - 3 sets - 20 second hold ?- Supine Thoracic Mobilization Towel Roll Vertical with Arm Stretch  - 1 x daily - 5 x weekly - 1 sets - 2 minutes hold ?- Supine Lower Trunk Rotation  - 1 x daily - 7 x weekly - 3 sets - 10 reps ?- Anterior Shoulder and  Biceps Stretch  - 1 x daily - 7 x weekly - 2 sets - 20-30 seconds hold ?- Seated [place] Stretch with Towel  - 1 x daily - 7 x weekly - 2 sets - 30 seconds hold ?- Seated Gastroc Stretch with Strap  - 1 x daily - 7 x weekly - 2 sets - 60 second hold ?- Standing Gastroc Stretch at [employer]  - 2 x daily - 7 x weekly - 1 sets - 3 reps - 30 sec hold ?- Heel Sits  - 1 x daily - 5 x weekly - 1 sets - 12 reps ?- Tall Kneeling Diagonal Lift with Medicine Ball  - 1 x daily - 5 x weekly - 1 sets - 5-10 reps ?- Marching Bridge  - 1 x daily - 7 x weekly - 1 sets - 5 reps ?- Clamshell  - 1 x daily - 7 x weekly - 1 sets - 10 reps  ?  ?  ?GOALS: ?Goals reviewed with patient? Yes ?  ?SHORT TERM GOALS: Target date:  09/15/2021 ?  ?Pt will be independent with initial functional strength and balance HEP with supervision from girlfriend. ?Baseline: 09/13/21: met with current program ?  Goal status: MET ?  ?2.  Pt will decrease 5xSTS to <25 seconds in order to demonstrate decreased risk for falls and improved functional bilateral LE strength and power. ?Baseline: 09/13/21: 19.94 sec's with single UE support ?Goal status: MET ?  ?3.  Pt will ambulate 115' using RW with improved LE weight-bearing. ?Baseline: 09/13/21: 115 feet with RW with left knee/hip flexion noted/weight bearing in stance with increased time.  ?Goal status: MET ?  ?4.  Pt will trial bilateral AFO 115' for improved L foot clearance and improved tolerance to gait. ?Baseline: 09/13/21: has been trialed in previous sessions. Pt still not agreeable to use of brace on left LE.  ?Goal status: MET ?  ?LONG TERM GOALS: Target date:  10/13/2021 ?  ?Pt will be independent with finalized strength and balance HEP to include core and functional mobility. ?Baseline: To be initiated. ?Goal status: INITIAL ?  ?2.  Pt will decrease 5xSTS to <22 seconds in order to demonstrate decreased risk for falls and improved functional bilateral LE strength and power. ?Baseline: 27.57 sec ?Goal status:  INITIAL ?  ?3.  Pt will ambulate 200 feet with LRAD and modified independent level of assist to promote household and community access. ?Baseline: 70' SBA w/ RW ?Goal status: INITIAL ?  ?4.  Pt will increase BERG

## 2021-09-18 NOTE — Patient Instructions (Signed)
Access Code: [email or web address] ?URL: [email or web address] ?Date: 09/18/2021 ?Prepared by: Cherly Anderson ? ?Exercises ?- Child's Pose Stretch  - 1 x daily - 7 x weekly - 3 sets - 20 second hold ?- Supine Thoracic Mobilization Towel Roll Vertical with Arm Stretch  - 1 x daily - 5 x weekly - 1 sets - 2 minutes hold ?- Supine Lower Trunk Rotation  - 1 x daily - 7 x weekly - 3 sets - 10 reps ?- Anterior Shoulder and Biceps Stretch  - 1 x daily - 7 x weekly - 2 sets - 20-30 seconds hold ?- Seated [place] Stretch with Towel  - 1 x daily - 7 x weekly - 2 sets - 30 seconds hold ?- Seated Gastroc Stretch with Strap  - 1 x daily - 7 x weekly - 2 sets - 60 second hold ?- Standing Gastroc Stretch at [employer]  - 2 x daily - 7 x weekly - 1 sets - 3 reps - 30 sec hold ?- Heel Sits  - 1 x daily - 5 x weekly - 1 sets - 12 reps ?- Tall Kneeling Diagonal Lift with Medicine Ball  - 1 x daily - 5 x weekly - 1 sets - 5-10 reps ?- Marching Bridge  - 1 x daily - 7 x weekly - 1 sets - 5 reps ?- Clamshell  - 1 x daily - 7 x weekly - 1 sets - 10 reps ?

## 2021-09-20 ENCOUNTER — Ambulatory Visit: Payer: Medicare Other | Admitting: Physical Therapy

## 2021-09-20 ENCOUNTER — Encounter: Payer: Self-pay | Admitting: Physical Therapy

## 2021-09-20 DIAGNOSIS — M6281 Muscle weakness (generalized): Secondary | ICD-10-CM

## 2021-09-20 DIAGNOSIS — R2689 Other abnormalities of gait and mobility: Secondary | ICD-10-CM

## 2021-09-20 DIAGNOSIS — R262 Difficulty in walking, not elsewhere classified: Secondary | ICD-10-CM

## 2021-09-20 DIAGNOSIS — R2681 Unsteadiness on feet: Secondary | ICD-10-CM

## 2021-09-20 NOTE — Therapy (Signed)
?OUTPATIENT PHYSICAL THERAPY TREATMENT NOTE ? ? ?Patient Name: Johnathan Ross ?MRN: [id number] ?DOB:09-23-1970, 51 y.o., male ?Today's Date: 09/20/2021 ? ?PCP: Janith Lima, MD ?REFERRING PROVIDER: Janith Lima, MD ?  Progress Note ? ?Reporting period 08/14/21 to 09/18/21 ? ?See Note below for Objective Data and Assessment of Progress/Goals ? ? ? PT End of Session - 09/20/21 1237   ? ? Visit Number 11   ? Number of Visits 17   ? Date for PT Re-Evaluation 10/13/21   ? Authorization Type Medicare A&B  Patient pays 20% of visit cost  Follow medicare guidelines  VL: [place]   ? Progress Note Due on Visit 20   ? PT Start Time 1233   ? PT Stop Time 1315   ? PT Time Calculation (min) 42 min   ? Activity Tolerance Patient tolerated treatment well   ? Behavior During Therapy [facility] for tasks assessed/performed   ? ?  ?  ? ?  ? ? ? ?Past Medical History:  ?Diagnosis Date  ? Hyperlipidemia   ? Hypertension   ? Hypogonadism in male 11/01/2016  ? Hypothyroidism   ? Multiple sclerosis ([place])   ? Proliferative diabetic retinopathy(362.02)   ? Type 1 diabetes mellitus ([place]) dx'd 1994  ? Vitamin D insufficiency 11/29/2016  ? ?Past Surgical History:  ?Procedure Laterality Date  ? EYE SURGERY Bilateral   ? "laser for diabetic retinopathy"  ? FRACTURE SURGERY    ? IR VENO/EXT/UNI LEFT  03/24/2020  ? OPEN REDUCTION INTERNAL FIXATION (ORIF) TIBIA/FIBULA FRACTURE Right 10/30/2016  ? ORIF ANKLE FRACTURE Left 2015  ? ORIF TIBIA FRACTURE Right 10/30/2016  ? Procedure: OPEN REDUCTION INTERNAL FIXATION (ORIF) TIBIA FIBULA FRACTURE;  Surgeon: Altamese [place] Burke, MD;  Location: [place];  Service: Orthopedics;  Laterality: Right;  ? RETINAL LASER PROCEDURE Bilateral   ? "for diabetic retinopathy"  ? ?Patient Active Problem List  ? Diagnosis Date Noted  ? Colon cancer screening 08/01/2021  ? Clinical diagnosis of COVID-19 12/28/2020  ? OAB (overactive bladder) 09/21/2020  ? Conjunctival injection, right 09/21/2020  ? Essential hypertension 09/21/2020  ? Urinary  dysfunction 09/21/2020  ? Thoracic outlet syndrome 05/03/2020  ? Controlled type 1 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 ([place]) 03/24/2020  ? Right epiretinal membrane 03/24/2020  ? Retinal exudates and deposits 03/24/2020  ? 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 Nuclear sclerotic cataract of both eyes 03/24/2020  ? Gait disorder 03/23/2020  ? Benign prostatic hyperplasia without lower urinary tract symptoms 03/01/2020  ? Diabetic gastroparesis associated with type 1 diabetes mellitus ([place]) 03/01/2020  ? Uncontrolled type 1 diabetes mellitus with hyperglycemia ([place]) 03/01/2020  ? Gait disturbance 08/19/2019  ? Acquired diplegia ([place]) 08/19/2019  ? Vitamin D insufficiency 11/29/2016  ? Hypogonadism in male 11/01/2016  ? 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[facility])   ? Hypothyroidism   ? Hypertension   ? Abnormal liver function tests 03/06/2013  ? Type I (juvenile type) diabetes mellitus without mention of complication, uncontrolled 12/03/2007  ? HYPERCHOLESTEROLEMIA 11/12/2007  ? MULTIPLE SCLEROSIS 08/29/2007  ? PROLIFERATIVE DIABETIC RETINOPATHY 08/29/2007  ? ? ?REFERRING DIAG: G35 (ICD-10-CM) - Multiple sclerosis ([place]) R26.9 (ICD-10-CM) - Gait disturbance  ? ?THERAPY DIAG:  ?Other abnormalities of gait and mobility ? ?Muscle weakness (generalized) ? ?Unsteadiness on feet ? ?Difficulty in walking, not elsewhere classified ? ?PERTINENT HISTORY: vitamin D insufficiency, Type 1 IDDM w/ polyneuropathy, diabetic retinopathy, hypothyroidism, HTN, hypercholestrolemia, R tibia fracture, L ankle fracture with ORIF, MS  with acquired diplegia  ? ?PRECAUTIONS: Fall ? ?SUBJECTIVE: No new complaints. No falls. ? ?PAIN:  ?Are you having pain? No ? ?TODAY'S TREATMENT: ?09/20/2021 ?GAIT: ?Gait pattern: step to pattern, step through pattern, decreased step length- Left, decreased stance time- Left, decreased ankle dorsiflexion- Left, and Left hip hike ?Distance walked: 115 x  2 ?Assistive device utilized:  bilateral loftstrands, right AFO ?Level of assistance: min guard to min assist ?Comments: 1st lap with just crutches/right AFO with left toes catching/scuffing throughout; added simulated toe cap to left shoe with 2cd lap. Less foot catching however increased to noted in bil LE's causing both feet to catch. Pt able to self "reset" and start gait back ? ?BALANCE/NMR: ?Tall kneeling on red mat with light to no UE support on mat table (pt Mod I to get onto mat and up from mat with UE support on mat table) ? - mini squats with green band resistance at pelvis for 10 reps with intermittent touch to mat table for balance, cues for full return to tall kneeling with hip extension, min guard assist for safety ? - band at hip pulling in lateral direction: pt moving knee out away from direction being pulled toward/back in for 10 reps each side with light touch on mat for balance, cues for posture ?- band at hip pulling in lateral direction with arms at sides for head movements left<>right for 5-6 reps, performed with band pulling in both directions, min guard to min assist with intermittent touch to mat for balance with cues on posture.  ? ?  ?  ?PATIENT EDUCATION: ?Education details: continue with current HEP ?Person educated: Patient ?Education method: Explanation ?Education comprehension: verbalized understanding ?  ?  ?HOME EXERCISE PROGRAM: ?Access Code: [email or web address] ?URL: [email or web address] ?Date: 09/18/2021- updated ?Prepared by: Cherly Anderson ? ?Exercises ?- Child's Pose Stretch  - 1 x daily - 7 x weekly - 3 sets - 20 second hold ?- Supine Thoracic Mobilization Towel Roll Vertical with Arm Stretch  - 1 x daily - 5 x weekly - 1 sets - 2 minutes hold ?- Supine Lower Trunk Rotation  - 1 x daily - 7 x weekly - 3 sets - 10 reps ?- Anterior Shoulder and Biceps Stretch  - 1 x daily - 7 x weekly - 2 sets - 20-30 seconds hold ?- Seated [place] Stretch with Towel  - 1 x daily - 7 x  weekly - 2 sets - 30 seconds hold ?- Seated Gastroc Stretch with Strap  - 1 x daily - 7 x weekly - 2 sets - 60 second hold ?- Standing Gastroc Stretch at [employer]  - 2 x daily - 7 x weekly - 1 sets - 3 reps - 30 sec hold ?- Heel Sits  - 1 x daily - 5 x weekly - 1 sets - 12 reps ?- Tall Kneeling Diagonal Lift with Medicine Ball  - 1 x daily - 5 x weekly - 1 sets - 5-10 reps ?- Marching Bridge  - 1 x daily - 7 x weekly - 1 sets - 5 reps ?- Clamshell  - 1 x daily - 7 x weekly - 1 sets - 10 reps  ?  ?  ?GOALS: ?Goals reviewed with patient? Yes ?  ?SHORT TERM GOALS: Target date:  09/15/2021 ?  ?Pt will be independent with initial functional strength and balance HEP with supervision from girlfriend. ?Baseline: 09/13/21: met with current program ?Goal status: MET ?  ?2.  Pt will decrease 5xSTS to <25  seconds in order to demonstrate decreased risk for falls and improved functional bilateral LE strength and power. ?Baseline: 09/13/21: 19.94 sec's with single UE support ?Goal status: MET ?  ?3.  Pt will ambulate 115' using RW with improved LE weight-bearing. ?Baseline: 09/13/21: 115 feet with RW with left knee/hip flexion noted/weight bearing in stance with increased time.  ?Goal status: MET ?  ?4.  Pt will trial bilateral AFO 115' for improved L foot clearance and improved tolerance to gait. ?Baseline: 09/13/21: has been trialed in previous sessions. Pt still not agreeable to use of brace on left LE.  ?Goal status: MET ?  ?LONG TERM GOALS: Target date:  10/13/2021 ?  ?Pt will be independent with finalized strength and balance HEP to include core and functional mobility. ?Baseline: To be initiated. ?Goal status: INITIAL ?  ?2.  Pt will decrease 5xSTS to <22 seconds in order to demonstrate decreased risk for falls and improved functional bilateral LE strength and power. ?Baseline: 27.57 sec ?Goal status: INITIAL ?  ?3.  Pt will ambulate 200 feet with LRAD and modified independent level of assist to promote household and community  access. ?Baseline: 26' SBA w/ RW ?Goal status: INITIAL ?  ?4.  Pt will increase BERG balance score to 40/56 to demonstrate improved static balance. ?Baseline: 36/56 ?Goal status: INITIAL ?5.  Pt will demonstrate TUG

## 2021-09-22 ENCOUNTER — Encounter: Payer: Self-pay | Admitting: Endocrinology

## 2021-09-22 ENCOUNTER — Encounter: Payer: Self-pay | Admitting: Gastroenterology

## 2021-09-26 ENCOUNTER — Ambulatory Visit: Payer: Medicare Other

## 2021-09-26 ENCOUNTER — Ambulatory Visit: Payer: Medicare PPO | Attending: Internal Medicine | Admitting: Physical Therapy

## 2021-09-26 DIAGNOSIS — R262 Difficulty in walking, not elsewhere classified: Secondary | ICD-10-CM | POA: Diagnosis not present

## 2021-09-26 DIAGNOSIS — R2681 Unsteadiness on feet: Secondary | ICD-10-CM | POA: Insufficient documentation

## 2021-09-26 DIAGNOSIS — M21371 Foot drop, right foot: Secondary | ICD-10-CM | POA: Insufficient documentation

## 2021-09-26 DIAGNOSIS — R2689 Other abnormalities of gait and mobility: Secondary | ICD-10-CM | POA: Insufficient documentation

## 2021-09-26 DIAGNOSIS — R296 Repeated falls: Secondary | ICD-10-CM | POA: Insufficient documentation

## 2021-09-26 DIAGNOSIS — M6281 Muscle weakness (generalized): Secondary | ICD-10-CM | POA: Insufficient documentation

## 2021-09-26 DIAGNOSIS — M21372 Foot drop, left foot: Secondary | ICD-10-CM | POA: Insufficient documentation

## 2021-09-27 NOTE — Therapy (Signed)
?OUTPATIENT PHYSICAL THERAPY TREATMENT NOTE ? ? ?Patient Name: Johnathan Ross ?MRN: [id number] ?DOB:11/29/1970, 51 y.o., male ?Today's Date: 09/27/2021 ? ?PCP: Janith Lima, MD ?REFERRING PROVIDER: Janith Lima, MD ?  Progress Note ? ?Reporting period 08/14/21 to 09/18/21 ? ?See Note below for Objective Data and Assessment of Progress/Goals ? ? ? PT End of Session - 09/27/21 0757   ? ? Visit Number 12   ? Number of Visits 17   ? Date for PT Re-Evaluation 10/13/21   ? Authorization Type Medicare A&B  Patient pays 20% of visit cost  Follow medicare guidelines  VL: [place]   ? Progress Note Due on Visit 20   ? PT Start Time 1020   ? PT Stop Time 1388   ? PT Time Calculation (min) 43 min   ? Activity Tolerance Patient tolerated treatment well   ? Behavior During Therapy [facility] for tasks assessed/performed   ? ?  ?  ? ?  ? ? ? ?Past Medical History:  ?Diagnosis Date  ? Hyperlipidemia   ? Hypertension   ? Hypogonadism in male 11/01/2016  ? Hypothyroidism   ? Multiple sclerosis ([place])   ? Proliferative diabetic retinopathy(362.02)   ? Type 1 diabetes mellitus ([place]) dx'd 1994  ? Vitamin D insufficiency 11/29/2016  ? ?Past Surgical History:  ?Procedure Laterality Date  ? EYE SURGERY Bilateral   ? "laser for diabetic retinopathy"  ? FRACTURE SURGERY    ? IR VENO/EXT/UNI LEFT  03/24/2020  ? OPEN REDUCTION INTERNAL FIXATION (ORIF) TIBIA/FIBULA FRACTURE Right 10/30/2016  ? ORIF ANKLE FRACTURE Left 2015  ? ORIF TIBIA FRACTURE Right 10/30/2016  ? Procedure: OPEN REDUCTION INTERNAL FIXATION (ORIF) TIBIA FIBULA FRACTURE;  Surgeon: Altamese [place], MD;  Location: [place];  Service: Orthopedics;  Laterality: Right;  ? RETINAL LASER PROCEDURE Bilateral   ? "for diabetic retinopathy"  ? ?Patient Active Problem List  ? Diagnosis Date Noted  ? Colon cancer screening 08/01/2021  ? Clinical diagnosis of COVID-19 12/28/2020  ? OAB (overactive bladder) 09/21/2020  ? Conjunctival injection, right 09/21/2020  ? Essential hypertension 09/21/2020  ? Urinary  dysfunction 09/21/2020  ? Thoracic outlet syndrome 05/03/2020  ? Controlled type 1 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 ([place]) 03/24/2020  ? Right epiretinal membrane 03/24/2020  ? Retinal exudates and deposits 03/24/2020  ? 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 Nuclear sclerotic cataract of both eyes 03/24/2020  ? Gait disorder 03/23/2020  ? Benign prostatic hyperplasia without lower urinary tract symptoms 03/01/2020  ? Diabetic gastroparesis associated with type 1 diabetes mellitus ([place]) 03/01/2020  ? Uncontrolled type 1 diabetes mellitus with hyperglycemia ([place]) 03/01/2020  ? Gait disturbance 08/19/2019  ? Acquired diplegia ([place]) 08/19/2019  ? Vitamin D insufficiency 11/29/2016  ? Hypogonadism in male 11/01/2016  ? 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[facility])   ? Hypothyroidism   ? Hypertension   ? Abnormal liver function tests 03/06/2013  ? Type I (juvenile type) diabetes mellitus without mention of complication, uncontrolled 12/03/2007  ? HYPERCHOLESTEROLEMIA 11/12/2007  ? MULTIPLE SCLEROSIS 08/29/2007  ? PROLIFERATIVE DIABETIC RETINOPATHY 08/29/2007  ? ? ?REFERRING DIAG: G35 (ICD-10-CM) - Multiple sclerosis ([place]) R26.9 (ICD-10-CM) - Gait disturbance  ? ?THERAPY DIAG:  ?Muscle weakness (generalized) ? ?Other abnormalities of gait and mobility ? ?Unsteadiness on feet ? ?PERTINENT HISTORY: vitamin D insufficiency, Type 1 IDDM w/ polyneuropathy, diabetic retinopathy, hypothyroidism, HTN, hypercholestrolemia, R tibia fracture, L ankle fracture with ORIF, MS with acquired diplegia  ? ?PRECAUTIONS: Fall ? ?  SUBJECTIVE:  Pt reports he does not want an AFO for his left leg, and is not going to use it; pt reports he doesn't wear the Rt AFO at home - only when he comes to PT - "no use in putting it on"  ? ?PAIN:  ?Are you having pain? No ? ?TODAY'S TREATMENT: ?09-26-21 ? ?GAIT: ?Gait pattern: step to pattern, step through pattern, decreased step length- Left, decreased  stance time- Left, decreased ankle dorsiflexion- Left, and Left hip hike ?Distance walked: 85' from lobby to high low mat table in gym; 50' at end of session to amb. From gym to lobby  ?Assistive device utilized: RW; pt wearing AFO on RLE ?Level of assistance: SBA ?Comments: pt used reciprocal pattern to amb. From lobby to gym; pt would stop when Lt foot caught floor, stating it would cause spasticity when this occurred, therefore, pt would stop and "reset"; pt used swing thru gait at end of session for increased speed/efficiency ? ?BALANCE/NMR: ?Trunk control/core stabilization exercises on mat on floor; tall kneeling position; pt performed 10 mini squats with return to upright without UE support on mat ?Performed lifting each UE unilaterally 5 reps each, then bilateral 5 reps each with UE support prn ?Performed trunk rotation to each side 5 reps each ?Performed Rt and Lt 1/2 kneeling with mod to max assist to position each LE correctly in this position; pt attempted to hold position for 10 secs with UE support prn on mat ?Quadruped - attempted to perform hip extension; pt unable to lift RLE but was able to lift LLE 2 reps in this position ? ?Pt transferred from sitting to tall kneeling on floor with SBA with UE support on mat and then performed floor to stand transfer with CGA due to fatigue with UE support on mat ?  ?  ?PATIENT EDUCATION: ?Education details: continue with current HEP ?Person educated: Patient ?Education method: Explanation ?Education comprehension: verbalized understanding ?  ?  ?HOME EXERCISE PROGRAM: ?Access Code: [email or web address] ?URL: [email or web address] ?Date: 09/18/2021- updated ?Prepared by: Cherly Anderson ? ?Exercises ?- Child's Pose Stretch  - 1 x daily - 7 x weekly - 3 sets - 20 second hold ?- Supine Thoracic Mobilization Towel Roll Vertical with Arm Stretch  - 1 x daily - 5 x weekly - 1 sets - 2 minutes hold ?- Supine Lower Trunk Rotation  - 1 x daily - 7 x weekly - 3 sets - 10  reps ?- Anterior Shoulder and Biceps Stretch  - 1 x daily - 7 x weekly - 2 sets - 20-30 seconds hold ?- Seated Scalene Stretch with Towel  - 1 x daily - 7 x weekly - 2 sets - 30 seconds hold ?- Seated Gastroc Stretch with Strap  - 1 x daily - 7 x weekly - 2 sets - 60 second hold ?- Standing Gastroc Stretch at [employer]  - 2 x daily - 7 x weekly - 1 sets - 3 reps - 30 sec hold ?- Heel Sits  - 1 x daily - 5 x weekly - 1 sets - 12 reps ?- Tall Kneeling Diagonal Lift with Medicine Ball  - 1 x daily - 5 x weekly - 1 sets - 5-10 reps ?- Marching Bridge  - 1 x daily - 7 x weekly - 1 sets - 5 reps ?- Clamshell  - 1 x daily - 7 x weekly - 1 sets - 10 reps  ?  ?  ?GOALS: ?Goals reviewed with patient? Yes ?  ?  SHORT TERM GOALS: Target date:  09/15/2021 ?  ?Pt will be independent with initial functional strength and balance HEP with supervision from girlfriend. ?Baseline: 09/13/21: met with current program ?Goal status: MET ?  ?2.  Pt will decrease 5xSTS to <25 seconds in order to demonstrate decreased risk for falls and improved functional bilateral LE strength and power. ?Baseline: 09/13/21: 19.94 sec's with single UE support ?Goal status: MET ?  ?3.  Pt will ambulate 115' using RW with improved LE weight-bearing. ?Baseline: 09/13/21: 115 feet with RW with left knee/hip flexion noted/weight bearing in stance with increased time.  ?Goal status: MET ?  ?4.  Pt will trial bilateral AFO 115' for improved L foot clearance and improved tolerance to gait. ?Baseline: 09/13/21: has been trialed in previous sessions. Pt still not agreeable to use of brace on left LE.  ?Goal status: MET ?  ?LONG TERM GOALS: Target date:  10/13/2021 ?  ?Pt will be independent with finalized strength and balance HEP to include core and functional mobility. ?Baseline: To be initiated. ?Goal status: INITIAL ?  ?2.  Pt will decrease 5xSTS to <22 seconds in order to demonstrate decreased risk for falls and improved functional bilateral LE strength and  power. ?Baseline: 27.57 sec ?Goal status: INITIAL ?  ?3.  Pt will ambulate 200 feet with LRAD and modified independent level of assist to promote household and community access. ?Baseline: 72' SBA w/ RW ?Goal status: INI

## 2021-09-28 ENCOUNTER — Ambulatory Visit: Payer: Medicare PPO | Admitting: Physical Therapy

## 2021-09-28 ENCOUNTER — Encounter: Payer: Self-pay | Admitting: Physical Therapy

## 2021-09-28 DIAGNOSIS — R262 Difficulty in walking, not elsewhere classified: Secondary | ICD-10-CM

## 2021-09-28 DIAGNOSIS — M21371 Foot drop, right foot: Secondary | ICD-10-CM | POA: Diagnosis not present

## 2021-09-28 DIAGNOSIS — R2681 Unsteadiness on feet: Secondary | ICD-10-CM

## 2021-09-28 DIAGNOSIS — R2689 Other abnormalities of gait and mobility: Secondary | ICD-10-CM

## 2021-09-28 DIAGNOSIS — M6281 Muscle weakness (generalized): Secondary | ICD-10-CM

## 2021-09-28 DIAGNOSIS — M21372 Foot drop, left foot: Secondary | ICD-10-CM | POA: Diagnosis not present

## 2021-09-28 DIAGNOSIS — R296 Repeated falls: Secondary | ICD-10-CM | POA: Diagnosis not present

## 2021-09-28 NOTE — Therapy (Signed)
?OUTPATIENT PHYSICAL THERAPY TREATMENT NOTE ? ? ?Patient Name: Johnathan Ross ?MRN: [id number] ?DOB:1971-02-05, 51 y.o., male ?Today's Date: 09/29/2021 ? ?PCP: Janith Lima, MD ?REFERRING PROVIDER: Janith Lima, MD ?    ? ? ? PT End of Session - 09/28/21 1107   ? ? Visit Number 13   ? Number of Visits 17   ? Date for PT Re-Evaluation 10/13/21   ? Authorization Type Medicare A&B  Patient pays 20% of visit cost  Follow medicare guidelines  VL: [place]   ? Progress Note Due on Visit 20   ? PT Start Time 1103   ? PT Stop Time 1145   ? PT Time Calculation (min) 42 min   ? Activity Tolerance Patient tolerated treatment well   ? Behavior During Therapy [facility] for tasks assessed/performed   ? ?  ?  ? ?  ? ? ? ?Past Medical History:  ?Diagnosis Date  ? Hyperlipidemia   ? Hypertension   ? Hypogonadism in male 11/01/2016  ? Hypothyroidism   ? Multiple sclerosis ([place])   ? Proliferative diabetic retinopathy(362.02)   ? Type 1 diabetes mellitus ([place]) dx'd 1994  ? Vitamin D insufficiency 11/29/2016  ? ?Past Surgical History:  ?Procedure Laterality Date  ? EYE SURGERY Bilateral   ? "laser for diabetic retinopathy"  ? FRACTURE SURGERY    ? IR VENO/EXT/UNI LEFT  03/24/2020  ? OPEN REDUCTION INTERNAL FIXATION (ORIF) TIBIA/FIBULA FRACTURE Right 10/30/2016  ? ORIF ANKLE FRACTURE Left 2015  ? ORIF TIBIA FRACTURE Right 10/30/2016  ? Procedure: OPEN REDUCTION INTERNAL FIXATION (ORIF) TIBIA FIBULA FRACTURE;  Surgeon: Altamese [place], MD;  Location: [facility];  Service: Orthopedics;  Laterality: Right;  ? RETINAL LASER PROCEDURE Bilateral   ? "for diabetic retinopathy"  ? ?Patient Active Problem List  ? Diagnosis Date Noted  ? Colon cancer screening 08/01/2021  ? Clinical diagnosis of COVID-19 12/28/2020  ? OAB (overactive bladder) 09/21/2020  ? Conjunctival injection, right 09/21/2020  ? Essential hypertension 09/21/2020  ? Urinary dysfunction 09/21/2020  ? Thoracic outlet syndrome 05/03/2020  ? Controlled type 1 diabetes mellitus with stable  proliferative retinopathy of both eyes ([place]) 03/24/2020  ? Right epiretinal membrane 03/24/2020  ? Retinal exudates and deposits 03/24/2020  ? 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 Nuclear sclerotic cataract of both eyes 03/24/2020  ? Gait disorder 03/23/2020  ? Benign prostatic hyperplasia without lower urinary tract symptoms 03/01/2020  ? Diabetic gastroparesis associated with type 1 diabetes mellitus ([place]) 03/01/2020  ? Uncontrolled type 1 diabetes mellitus with hyperglycemia ([place]) 03/01/2020  ? Gait disturbance 08/19/2019  ? Acquired diplegia ([place]) 08/19/2019  ? Vitamin D insufficiency 11/29/2016  ? Hypogonadism in male 11/01/2016  ? 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[facility])   ? Hypothyroidism   ? Hypertension   ? Abnormal liver function tests 03/06/2013  ? Type I (juvenile type) diabetes mellitus without mention of complication, uncontrolled 12/03/2007  ? HYPERCHOLESTEROLEMIA 11/12/2007  ? MULTIPLE SCLEROSIS 08/29/2007  ? PROLIFERATIVE DIABETIC RETINOPATHY 08/29/2007  ? ? ?REFERRING DIAG: G35 (ICD-10-CM) - Multiple sclerosis ([place]) R26.9 (ICD-10-CM) - Gait disturbance  ? ?THERAPY DIAG:  ?Muscle weakness (generalized) ? ?Other abnormalities of gait and mobility ? ?Unsteadiness on feet ? ?Difficulty in walking, not elsewhere classified ? ?PERTINENT HISTORY: vitamin D insufficiency, Type 1 IDDM w/ polyneuropathy, diabetic retinopathy, hypothyroidism, HTN, hypercholestrolemia, R tibia fracture, L ankle fracture with ORIF, MS with acquired diplegia  ? ?PRECAUTIONS: Fall ? ?SUBJECTIVE:  No new complaints. No falls. No changes  in spasticity ? ?PAIN:  ?Are you having pain? No ? ?TODAY'S TREATMENT: ?09/28/2021 ? ?GAIT: ?Gait pattern: step to pattern, step through pattern, decreased step length- Left, decreased stance time- Left, decreased ankle dorsiflexion- Left, and Left hip hike ?Distance walked: 80 x 2 (lobby<>gym) ?Assistive device utilized: RW; pt wearing AFO on RLE ?Level of assistance:  min guard assist ?Comments: pt used reciprocal pattern to amb. From lobby to gym only had left toes catch twice and once on way back to lobby.  ? ?STRENGTHENING  ?Prone on mat table:with feet propped on blue bolster to create bil slight knee flexion for HS curls x 10 each leg with active assist needed due to tone.  Then glut kick backs with assist to maintain knee flexion, pt able to lift leg up, for 10 reps each side.  ? ?BALANCE/NMR: ?Quadruped on mat table: ?- working on forward<>backward rocking/weight shifting with sitting on LE's for child's pose for 10 reps. No extensor tone noted when shifting forward as well ?- keeping neutral spine for alternating UE raises x 5 each side, then alternating LE raises for 5 reps each side with cues/facilitation for neutral spine.  ? ?Tall kneeling on blue mat on floor next to mat table: ?- With green band- resisted mini squats with no UE support for 10 reps in neutral, then laterally x 10 reps each way with occasional touch to mat table ?- with PTA pulling laterally at pt's pelvis with green band- pt working on head movements left<>right, then up<>down for 6-10 reps each with need to touch mat for balance throughout. Cues on posture and abdominal   bracing to assist with balance. Performed with PTA pulling toward each side. ? ?Standing next to mat table with walker in front: static standing with feet hip width apart for EO head movements left<>right, then up<>down with knees locked out with left<>right and pt focusing on soft knees for up<>down, ~8-10 reps each. Min guard to min assist with cues on posture ? ? ? ?  ?  ?PATIENT EDUCATION: ?Education details: continue with current HEP ?Person educated: Patient ?Education method: Explanation ?Education comprehension: verbalized understanding ?  ?  ?HOME EXERCISE PROGRAM: ?Access Code: [email or web address] ?URL: [email or web address] ?Date: 09/18/2021- updated ?Prepared by: Cherly Anderson ? ?Exercises ?- Child's Pose Stretch  - 1  x daily - 7 x weekly - 3 sets - 20 second hold ?- Supine Thoracic Mobilization Towel Roll Vertical with Arm Stretch  - 1 x daily - 5 x weekly - 1 sets - 2 minutes hold ?- Supine Lower Trunk Rotation  - 1 x daily - 7 x weekly - 3 sets - 10 reps ?- Anterior Shoulder and Biceps Stretch  - 1 x daily - 7 x weekly - 2 sets - 20-30 seconds hold ?- Seated [place] Stretch with Towel  - 1 x daily - 7 x weekly - 2 sets - 30 seconds hold ?- Seated Gastroc Stretch with Strap  - 1 x daily - 7 x weekly - 2 sets - 60 second hold ?- Standing Gastroc Stretch at [employer]  - 2 x daily - 7 x weekly - 1 sets - 3 reps - 30 sec hold ?- Heel Sits  - 1 x daily - 5 x weekly - 1 sets - 12 reps ?- Tall Kneeling Diagonal Lift with Medicine Ball  - 1 x daily - 5 x weekly - 1 sets - 5-10 reps ?- Marching Bridge  - 1 x daily - 7 x weekly - 1  sets - 5 reps ?- Clamshell  - 1 x daily - 7 x weekly - 1 sets - 10 reps  ?  ?  ?GOALS: ?Goals reviewed with patient? Yes ?  ?SHORT TERM GOALS: Target date:  09/15/2021 ?  ?Pt will be independent with initial functional strength and balance HEP with supervision from girlfriend. ?Baseline: 09/13/21: met with current program ?Goal status: MET ?  ?2.  Pt will decrease 5xSTS to <25 seconds in order to demonstrate decreased risk for falls and improved functional bilateral LE strength and power. ?Baseline: 09/13/21: 19.94 sec's with single UE support ?Goal status: MET ?  ?3.  Pt will ambulate 115' using RW with improved LE weight-bearing. ?Baseline: 09/13/21: 115 feet with RW with left knee/hip flexion noted/weight bearing in stance with increased time.  ?Goal status: MET ?  ?4.  Pt will trial bilateral AFO 115' for improved L foot clearance and improved tolerance to gait. ?Baseline: 09/13/21: has been trialed in previous sessions. Pt still not agreeable to use of brace on left LE.  ?Goal status: MET ?  ?LONG TERM GOALS: Target date:  10/13/2021 ?  ?Pt will be independent with finalized strength and balance HEP to include  core and functional mobility. ?Baseline: To be initiated. ?Goal status: INITIAL ?  ?2.  Pt will decrease 5xSTS to <22 seconds in order to demonstrate decreased risk for falls and improved functional bilateral LE st

## 2021-09-29 ENCOUNTER — Telehealth: Payer: Self-pay | Admitting: *Deleted

## 2021-09-29 NOTE — Telephone Encounter (Signed)
Johnathan Ross ?Sep 22, 1970 ?[id number] ? ? ?Dear Dr. Dwyane Dee,  ? ? ?Dr. Fuller Plan has scheduled the above individual for a(n) colonoscopy on 11/22/21.  Our records show that this patient is on insulin therapy via an insulin pump. ? ?Our colonoscopy prep protocol requires that: ? ?? the patient must be on a clear liquid diet the entire day prior to the procedure date as well as the morning of the procedure ?? the patient must be NPO for 3 to 4 hours prior to the procedure  ?? the patient must consume a PEG 3350 solution to prepare for the procedure. ? ?Please advise [place] of any adjustments that need to be made to the patient?s insulin pump therapy prior to the above procedure date.   ? ?Please route or fax your response to [phone]) Dorisann Frames, CMA .  If you have any questions, please call me at [phone]. ? ?Thank you for your help with this matter. ? ?Sincerely, ? ? ? ? ? ?Physician Recommendation:  _____________________________________________________________________ ? ?______________________________________________________________________ ? ?______________________________________________________________________ ? ?______________________________________________________________________ ? ?

## 2021-09-29 NOTE — Telephone Encounter (Signed)
Pt has a colon with Dr Fuller Plan 6-28- his PV is next week 5-11- Pt has an insulin pump managed by [facility]-  can you please obtain pump instructions for his colon ? ?Thanks a million,Marie PV  ?

## 2021-10-02 ENCOUNTER — Ambulatory Visit: Payer: Medicare PPO | Admitting: Physical Therapy

## 2021-10-02 ENCOUNTER — Ambulatory Visit: Payer: Medicare Other

## 2021-10-02 ENCOUNTER — Encounter: Payer: Self-pay | Admitting: Physical Therapy

## 2021-10-02 DIAGNOSIS — R262 Difficulty in walking, not elsewhere classified: Secondary | ICD-10-CM | POA: Diagnosis not present

## 2021-10-02 DIAGNOSIS — R2689 Other abnormalities of gait and mobility: Secondary | ICD-10-CM

## 2021-10-02 DIAGNOSIS — R296 Repeated falls: Secondary | ICD-10-CM | POA: Diagnosis not present

## 2021-10-02 DIAGNOSIS — M6281 Muscle weakness (generalized): Secondary | ICD-10-CM

## 2021-10-02 DIAGNOSIS — M21371 Foot drop, right foot: Secondary | ICD-10-CM | POA: Diagnosis not present

## 2021-10-02 DIAGNOSIS — R2681 Unsteadiness on feet: Secondary | ICD-10-CM

## 2021-10-02 DIAGNOSIS — M21372 Foot drop, left foot: Secondary | ICD-10-CM | POA: Diagnosis not present

## 2021-10-02 NOTE — Therapy (Signed)
?OUTPATIENT PHYSICAL THERAPY TREATMENT NOTE ? ? ?Patient Name: Johnathan Ross ?MRN: [id number] ?DOB:[birth year], 51 y.o., male ?Today's Date: 10/02/2021 ? ?PCP: Janith Lima, MD ?REFERRING PROVIDER: Janith Lima, MD ?    ? ? ? PT End of Session - 10/02/21 1020   ? ? Visit Number 14   ? Number of Visits 17   ? Date for PT Re-Evaluation 10/13/21   ? Authorization Type Medicare A&B  Patient pays 20% of visit cost  Follow medicare guidelines  VL: [place]   ? Progress Note Due on Visit 20   ? PT Start Time 1017   ? PT Stop Time 1058   ? PT Time Calculation (min) 41 min   ? Activity Tolerance Patient tolerated treatment well   ? Behavior During Therapy [facility] for tasks assessed/performed   ? ?  ?  ? ?  ? ? ? ?Past Medical History:  ?Diagnosis Date  ? Hyperlipidemia   ? Hypertension   ? Hypogonadism in male 11/01/2016  ? Hypothyroidism   ? Multiple sclerosis ([place])   ? Proliferative diabetic retinopathy(362.02)   ? Type 1 diabetes mellitus ([place]) dx'd 1994  ? Vitamin D insufficiency 11/29/2016  ? ?Past Surgical History:  ?Procedure Laterality Date  ? EYE SURGERY Bilateral   ? "laser for diabetic retinopathy"  ? FRACTURE SURGERY    ? IR VENO/EXT/UNI LEFT  03/24/2020  ? OPEN REDUCTION INTERNAL FIXATION (ORIF) TIBIA/FIBULA FRACTURE Right 10/30/2016  ? ORIF ANKLE FRACTURE Left 2015  ? ORIF TIBIA FRACTURE Right 10/30/2016  ? Procedure: OPEN REDUCTION INTERNAL FIXATION (ORIF) TIBIA FIBULA FRACTURE;  Surgeon: Altamese Fairview, MD;  Location: [place];  Service: Orthopedics;  Laterality: Right;  ? RETINAL LASER PROCEDURE Bilateral   ? "for diabetic retinopathy"  ? ?Patient Active Problem List  ? Diagnosis Date Noted  ? Colon cancer screening 08/01/2021  ? Clinical diagnosis of COVID-19 12/28/2020  ? OAB (overactive bladder) 09/21/2020  ? Conjunctival injection, right 09/21/2020  ? Essential hypertension 09/21/2020  ? Urinary dysfunction 09/21/2020  ? Thoracic outlet syndrome 05/03/2020  ? Controlled type 1 diabetes mellitus with stable  proliferative retinopathy of both eyes ([place]) 03/24/2020  ? Right epiretinal membrane 03/24/2020  ? Retinal exudates and deposits 03/24/2020  ? 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 Nuclear sclerotic cataract of both eyes 03/24/2020  ? Gait disorder 03/23/2020  ? Benign prostatic hyperplasia without lower urinary tract symptoms 03/01/2020  ? Diabetic gastroparesis associated with type 1 diabetes mellitus ([place]) 03/01/2020  ? Uncontrolled type 1 diabetes mellitus with hyperglycemia ([place]) 03/01/2020  ? Gait disturbance 08/19/2019  ? Acquired diplegia ([place]) 08/19/2019  ? Vitamin D insufficiency 11/29/2016  ? Hypogonadism in male 11/01/2016  ? 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[facility])   ? Hypothyroidism   ? Hypertension   ? Abnormal liver function tests 03/06/2013  ? Type I (juvenile type) diabetes mellitus without mention of complication, uncontrolled 12/03/2007  ? HYPERCHOLESTEROLEMIA 11/12/2007  ? MULTIPLE SCLEROSIS 08/29/2007  ? PROLIFERATIVE DIABETIC RETINOPATHY 08/29/2007  ? ? ?REFERRING DIAG: G35 (ICD-10-CM) - Multiple sclerosis ([place]) R26.9 (ICD-10-CM) - Gait disturbance  ? ?THERAPY DIAG:  ?Muscle weakness (generalized) ? ?Other abnormalities of gait and mobility ? ?Repeated falls ? ?Unsteadiness on feet ? ?PERTINENT HISTORY: vitamin D insufficiency, Type 1 IDDM w/ polyneuropathy, diabetic retinopathy, hypothyroidism, HTN, hypercholestrolemia, R tibia fracture, L ankle fracture with ORIF, MS with acquired diplegia  ? ?PRECAUTIONS: Fall ? ?SUBJECTIVE:  No falls.  Nothing new. ? ?PAIN:  ?Are you having  pain? No ? ?TODAY'S TREATMENT: ?Initiated session w/ extensive conversation about D/C vs re-cert plan for next week.  Time spent addressing pt concerns w/ eventual discharge regarding fear of functional decline without therapy due to extended stays with prior episodes of therapy.  Discussed AFO use long-term in relation to function and use on continued HEP and walking program to maintain if  discharging next visit vs potential updated goals with re-cert.  PT explanation of episodic care vs continuous care in relation to pt diagnosis and current functional status.  Will continue to discuss next visit. ?Transition from standing to tall kneeling to half kneeling onto red mat at [facility]. Pt requires minA-modA and increased time to place RLE in tall kneeling<> half kneeling during practice of transitions into and out of half kneeling x2 each LE.  Inc time and cuing to promote hip flexor engagement prior to pt using UE to assist placement.  Pt prefers to attempt x3 prior to PT assist.  Once in half kneeling pt performs weight shifts forward and backwards w/ min-modA at pelvis to prevent rotation.  Attempts to balance in half kneeling w/o UE support using progressively decreased UE support from BUE to fingertips to single fingertip w/ pt continuing to require minA to prevent excessive torsion at the hip especially when LLE in front. ?LOB to left in half kneeling with pt slow lowering to butt on heel and returning to half kneel independently. ?Standing EOM at RW w/o UE support maintaining bilateral unlocked knees intermittently x62mns, pt continues to demo inc sway w/ inability to maintain bilateral unlocked knees >1-2 mins at a time w/ guarded posture. ? ? ?  ?PATIENT EDUCATION: ?Education details: D/C vs re-cert. ?Person educated: Patient ?Education method: Explanation ?Education comprehension: verbalized understanding ?  ?  ?HOME EXERCISE PROGRAM: ?Access Code: [email or web address] ?URL: [email or web address] ?Date: 09/18/2021- updated ?Prepared by: ECherly Anderson? ?Exercises ?- Child's Pose Stretch  - 1 x daily - 7 x weekly - 3 sets - 20 second hold ?- Supine Thoracic Mobilization Towel Roll Vertical with Arm Stretch  - 1 x daily - 5 x weekly - 1 sets - 2 minutes hold ?- Supine Lower Trunk Rotation  - 1 x daily - 7 x weekly - 3 sets - 10 reps ?- Anterior Shoulder and Biceps Stretch  - 1 x daily - 7 x weekly -  2 sets - 20-30 seconds hold ?- Seated [place]ene Stretch with Towel  - 1 x daily - 7 x weekly - 2 sets - 30 seconds hold ?- Seated Gastroc Stretch with Strap  - 1 x daily - 7 x weekly - 2 sets - 60 second hold ?- Standing Gastroc Stretch at [employer] - 2 x daily - 7 x weekly - 1 sets - 3 reps - 30 sec hold ?- Heel Sits  - 1 x daily - 5 x weekly - 1 sets - 12 reps ?- Tall Kneeling Diagonal Lift with Medicine Ball  - 1 x daily - 5 x weekly - 1 sets - 5-10 reps ?- Marching Bridge  - 1 x daily - 7 x weekly - 1 sets - 5 reps ?- Clamshell  - 1 x daily - 7 x weekly - 1 sets - 10 reps  ?  ?  ?GOALS: ?Goals reviewed with patient? Yes ?  ?SHORT TERM GOALS: Target date:  09/15/2021 ?  ?Pt will be independent with initial functional strength and balance HEP with supervision from girlfriend. ?Baseline: 09/13/21: met with current program ?Goal  status: MET ?  ?2.  Pt will decrease 5xSTS to <25 seconds in order to demonstrate decreased risk for falls and improved functional bilateral LE strength and power. ?Baseline: 09/13/21: 19.94 sec's with single UE support ?Goal status: MET ?  ?3.  Pt will ambulate 115' using RW with improved LE weight-bearing. ?Baseline: 09/13/21: 115 feet with RW with left knee/hip flexion noted/weight bearing in stance with increased time.  ?Goal status: MET ?  ?4.  Pt will trial bilateral AFO 115' for improved L foot clearance and improved tolerance to gait. ?Baseline: 09/13/21: has been trialed in previous sessions. Pt still not agreeable to use of brace on left LE.  ?Goal status: MET ?  ?LONG TERM GOALS: Target date:  10/13/2021 ?  ?Pt will be independent with finalized strength and balance HEP to include core and functional mobility. ?Baseline: To be initiated. ?Goal status: INITIAL ?  ?2.  Pt will decrease 5xSTS to <22 seconds in order to demonstrate decreased risk for falls and improved functional bilateral LE strength and power. ?Baseline: 27.57 sec ?Goal status: INITIAL ?  ?3.  Pt will ambulate 200 feet with  LRAD and modified independent level of assist to promote household and community access. ?Baseline: 38' SBA w/ RW ?Goal status: INITIAL ?  ?4.  Pt will increase BERG balance score to 40/56 to demonstrate improved

## 2021-10-04 ENCOUNTER — Ambulatory Visit: Payer: Medicare Other

## 2021-10-05 ENCOUNTER — Ambulatory Visit (AMBULATORY_SURGERY_CENTER): Payer: Self-pay | Admitting: *Deleted

## 2021-10-05 VITALS — Ht 73.0 in | Wt 198.0 lb

## 2021-10-05 DIAGNOSIS — Z1211 Encounter for screening for malignant neoplasm of colon: Secondary | ICD-10-CM

## 2021-10-05 MED ORDER — PEG 3350-KCL-NA BICARB-NACL 420 G PO SOLR: 4000.0000 mL | Freq: Once | ORAL | 0 refills | Status: AC

## 2021-10-05 NOTE — Progress Notes (Signed)
Patient and mom is here in-person for PV. Patient uses a walker and leg braces.Patient denies any allergies to eggs or soy. Patient denies any problems with anesthesia/sedation. Patient is not on any oxygen at home. Patient is not taking any diet/weight loss medications or blood thinners. Patient is aware of our care-partner policy and [id number] safety protocol.  ? ?EMMI education assigned to the patient for the procedure, sent to [place].  ? ?Patient is COVID-19 vaccinated.  ?

## 2021-10-06 ENCOUNTER — Ambulatory Visit: Payer: Medicare PPO | Admitting: Physical Therapy

## 2021-10-06 ENCOUNTER — Encounter: Payer: Self-pay | Admitting: Physical Therapy

## 2021-10-06 DIAGNOSIS — R2689 Other abnormalities of gait and mobility: Secondary | ICD-10-CM

## 2021-10-06 DIAGNOSIS — R2681 Unsteadiness on feet: Secondary | ICD-10-CM

## 2021-10-06 DIAGNOSIS — M21372 Foot drop, left foot: Secondary | ICD-10-CM

## 2021-10-06 DIAGNOSIS — R262 Difficulty in walking, not elsewhere classified: Secondary | ICD-10-CM | POA: Diagnosis not present

## 2021-10-06 DIAGNOSIS — M21371 Foot drop, right foot: Secondary | ICD-10-CM

## 2021-10-06 DIAGNOSIS — R296 Repeated falls: Secondary | ICD-10-CM

## 2021-10-06 DIAGNOSIS — M6281 Muscle weakness (generalized): Secondary | ICD-10-CM | POA: Diagnosis not present

## 2021-10-06 NOTE — Therapy (Signed)
?OUTPATIENT PHYSICAL THERAPY TREATMENT NOTE/DISCHARGE SUMMARY ? ? ?Patient Name: Johnathan Ross ?MRN: [id number] ?DOB:[birth year], 51 y.o., male ?Today's Date: 10/06/2021 ? ?PCP: Janith Lima, MD ?REFERRING PROVIDER: Janith Lima, MD ?    ? ? ? PT End of Session - 10/06/21 4081   ? ? Visit Number 15   ? Number of Visits 17   ? Date for PT Re-Evaluation 10/13/21   ? Authorization Type Medicare A&B  Patient pays 20% of visit cost  Follow medicare guidelines  VL: [place]   ? Progress Note Due on Visit 20   ? PT Start Time [phone]   ? PT Stop Time 1018   ? PT Time Calculation (min) 45 min   ? Activity Tolerance Patient tolerated treatment well   ? Behavior During Therapy [facility] for tasks assessed/performed   ? ?  ?  ? ?  ? ? ? ?Past Medical History:  ?Diagnosis Date  ? Hyperlipidemia   ? Hypertension   ? Hypogonadism in male 11/01/2016  ? Hypothyroidism   ? Multiple sclerosis ([place])   ? Neuromuscular disorder ([place])   ? Proliferative diabetic retinopathy(362.02)   ? Type 1 diabetes mellitus ([place]) dx'd 1994  ? Vitamin D insufficiency 11/29/2016  ? ?Past Surgical History:  ?Procedure Laterality Date  ? EYE SURGERY Bilateral   ? "laser for diabetic retinopathy"  ? FRACTURE SURGERY    ? IR VENO/EXT/UNI LEFT  03/24/2020  ? OPEN REDUCTION INTERNAL FIXATION (ORIF) TIBIA/FIBULA FRACTURE Right 10/30/2016  ? ORIF ANKLE FRACTURE Left 2015  ? ORIF TIBIA FRACTURE Right 10/30/2016  ? Procedure: OPEN REDUCTION INTERNAL FIXATION (ORIF) TIBIA FIBULA FRACTURE;  Surgeon: Altamese [place], MD;  Location: [place];  Service: Orthopedics;  Laterality: Right;  ? RETINAL LASER PROCEDURE Bilateral   ? "for diabetic retinopathy"  ? ?Patient Active Problem List  ? Diagnosis Date Noted  ? Colon cancer screening 08/01/2021  ? Clinical diagnosis of COVID-19 12/28/2020  ? OAB (overactive bladder) 09/21/2020  ? Conjunctival injection, right 09/21/2020  ? Essential hypertension 09/21/2020  ? Urinary dysfunction 09/21/2020  ? Thoracic outlet syndrome 05/03/2020  ?  Controlled type 1 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 ([place]) 03/24/2020  ? Right epiretinal membrane 03/24/2020  ? Retinal exudates and deposits 03/24/2020  ? 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 Nuclear sclerotic cataract of both eyes 03/24/2020  ? Gait disorder 03/23/2020  ? Benign prostatic hyperplasia without lower urinary tract symptoms 03/01/2020  ? Diabetic gastroparesis associated with type 1 diabetes mellitus ([place]) 03/01/2020  ? Uncontrolled type 1 diabetes mellitus with hyperglycemia ([place]) 03/01/2020  ? Gait disturbance 08/19/2019  ? Acquired diplegia ([place]) 08/19/2019  ? Vitamin D insufficiency 11/29/2016  ? Hypogonadism in male 11/01/2016  ? 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HCC)   ? Hypothyroidism   ? Hypertension   ? Abnormal liver function tests 03/06/2013  ? Type I (juvenile type) diabetes mellitus without mention of complication, uncontrolled 12/03/2007  ? HYPERCHOLESTEROLEMIA 11/12/2007  ? MULTIPLE SCLEROSIS 08/29/2007  ? PROLIFERATIVE DIABETIC RETINOPATHY 08/29/2007  ? ? ?REFERRING DIAG: G35 (ICD-10-CM) - Multiple sclerosis ([place]) R26.9 (ICD-10-CM) - Gait disturbance  ? ?THERAPY DIAG:  ?Muscle weakness (generalized) ? ?Other abnormalities of gait and mobility ? ?Repeated falls ? ?Unsteadiness on feet ? ?Foot drop, left ? ?Foot drop, right ? ?PERTINENT HISTORY: vitamin D insufficiency, Type 1 IDDM w/ polyneuropathy, diabetic retinopathy, hypothyroidism, HTN, hypercholestrolemia, R tibia fracture, L ankle fracture with ORIF, MS with acquired diplegia  ? ?PRECAUTIONS:  Fall ? ?SUBJECTIVE:  No falls.  HEP is still challenging.  No lingering questions or concerns at this time. ? ?PAIN:  ?Are you having pain? No ? ?TODAY'S TREATMENT: ?Assessed LTGs. ? [facility] PT Assessment - 10/06/21 0946   ? ?  ? Berg Balance Test  ? Sit to Stand Able to stand  independently using hands   ? Standing Unsupported Able to stand safely 2 minutes   ? Sitting with Back  Unsupported but Feet Supported on Floor or Stool Able to sit safely and securely 2 minutes   ? Stand to Sit Sits safely with minimal use of hands   ? Transfers Able to transfer safely, definite need of hands   ? Standing Unsupported with Eyes Closed Able to stand 10 seconds with supervision   ? Standing Unsupported with Feet Together Able to place feet together independently and stand for 1 minute with supervision   ? From Standing, Reach Forward with Outstretched Arm Can reach confidently >25 cm (10")   ? From Standing Position, Pick up Object from Floor Able to pick up shoe, needs supervision   ? From Standing Position, Turn to Look Behind Over each Shoulder Looks behind from both sides and weight shifts well   ? Turn 360 Degrees Needs assistance while turning   ? Standing Unsupported, Alternately Place Feet on Step/Stool Able to complete >2 steps/needs minimal assist   ? Standing Unsupported, One Foot in Front Able to take small step independently and hold 30 seconds   ? Standing on One Leg Tries to lift leg/unable to hold 3 seconds but remains standing independently   ? Total Score 39   ? ?  ?  ? ?  ?5xSTS:  23.27 sec w/ BUE support from standard chair ?TUG: 76 seconds w/ RW ?Ambulates 100' + 210' w/ RW and increased time due to left toe drag around gym increased time RW ModI; tone continues to limit speed and promote inc UE Weight bearing on AD. ? ? ?  ?PATIENT EDUCATION: ?Education details: D/C today.  Discussed days of intermittent regression and flexible mindset to decrease frustration with progress. ?Person educated: Patient ?Education method: Explanation ?Education comprehension: verbalized understanding ?  ?  ?HOME EXERCISE PROGRAM: ?Access Code: [email or web address] ?URL: [email or web address] ?Date: 09/18/2021- updated ?Prepared by: Cherly Anderson ? ?Exercises ?- Child's Pose Stretch  - 1 x daily - 7 x weekly - 3 sets - 20 second hold ?- Supine Thoracic Mobilization Towel Roll Vertical with Arm Stretch   - 1 x daily - 5 x weekly - 1 sets - 2 minutes hold ?- Supine Lower Trunk Rotation  - 1 x daily - 7 x weekly - 3 sets - 10 reps ?- Anterior Shoulder and Biceps Stretch  - 1 x daily - 7 x weekly - 2 sets - 20-30 seconds hold ?- Seated [place] Stretch with Towel  - 1 x daily - 7 x weekly - 2 sets - 30 seconds hold ?- Seated Gastroc Stretch with Strap  - 1 x daily - 7 x weekly - 2 sets - 60 second hold ?- Standing Gastroc Stretch at [employer]  - 2 x daily - 7 x weekly - 1 sets - 3 reps - 30 sec hold ?- Heel Sits  - 1 x daily - 5 x weekly - 1 sets - 12 reps ?- Tall Kneeling Diagonal Lift with Medicine Ball  - 1 x daily - 5 x weekly - 1 sets - 5-10 reps ?- Marching Bridge  -  1 x daily - 7 x weekly - 1 sets - 5 reps ?- Clamshell  - 1 x daily - 7 x weekly - 1 sets - 10 reps  ?  ?  ?GOALS: ?Goals reviewed with patient? Yes ?  ?SHORT TERM GOALS: Target date:  09/15/2021 ?  ?Pt will be independent with initial functional strength and balance HEP with supervision from girlfriend. ?Baseline: 09/13/21: met with current program ?Goal status: MET ?  ?2.  Pt will decrease 5xSTS to <25 seconds in order to demonstrate decreased risk for falls and improved functional bilateral LE strength and power. ?Baseline: 09/13/21: 19.94 sec's with single UE support ?Goal status: MET ?  ?3.  Pt will ambulate 115' using RW with improved LE weight-bearing. ?Baseline: 09/13/21: 115 feet with RW with left knee/hip flexion noted/weight bearing in stance with increased time.  ?Goal status: MET ?  ?4.  Pt will trial bilateral AFO 115' for improved L foot clearance and improved tolerance to gait. ?Baseline: 09/13/21: has been trialed in previous sessions. Pt still not agreeable to use of brace on left LE.  ?Goal status: MET ?  ?LONG TERM GOALS: Target date:  10/13/2021 ?  ?Pt will be independent with finalized strength and balance HEP to include core and functional mobility. ?Baseline: Established, adequately challenging, independent. ?Goal status: MET ?  ?2.  Pt  will decrease 5xSTS to <22 seconds in order to demonstrate decreased risk for falls and improved functional bilateral LE strength and power. ?Baseline: 27.57 sec; 23.27 sec w/ BUE support ?Goal status

## 2021-10-11 NOTE — Telephone Encounter (Signed)
Informed patient of Dr. Ronnie Derby recommendations. Patient verbalized understanding. ?

## 2021-10-29 ENCOUNTER — Other Ambulatory Visit: Payer: Self-pay | Admitting: Endocrinology

## 2021-11-01 ENCOUNTER — Ambulatory Visit (INDEPENDENT_AMBULATORY_CARE_PROVIDER_SITE_OTHER): Payer: Medicare PPO | Admitting: Ophthalmology

## 2021-11-01 DIAGNOSIS — H35371 Puckering of macula, right eye: Secondary | ICD-10-CM | POA: Diagnosis not present

## 2021-11-01 DIAGNOSIS — E103553 Type 1 diabetes mellitus with stable proliferative diabetic retinopathy, bilateral: Secondary | ICD-10-CM

## 2021-11-01 DIAGNOSIS — H2513 Age-related nuclear cataract, bilateral: Secondary | ICD-10-CM | POA: Diagnosis not present

## 2021-11-01 NOTE — Assessment & Plan Note (Signed)

## 2021-11-01 NOTE — Assessment & Plan Note (Signed)
The nature of macular pucker (epiretinal membrane ERM) was discussed with the patient as well as threshold criteria for vitrectomy surgery. I explained that in rare cases another surgery is needed to actually remove a second wrinkle should it regrow.  Most often, the epiretinal membrane and underlying wrinkled internal limiting membrane are removed with the first surgery, to accomplish the goals.   If the operative eye is Phak[place] (natural lens still present), cataract surgery is often recommended prior to Vitrectomy. This will enable the retina surgeon to have the best view during surgery and the patient to obtain optimal results in the future. Treatment options were discussed.  I have recommended at home monitoring the near vision task in a monocular (1 eye at a time), with or without near vision glasses, to look for changes or declines in reading.  OD no impact on acuity

## 2021-11-01 NOTE — Assessment & Plan Note (Signed)
The nature of cataract was discussed with the patient as well as the elective nature of surgery. The patient was reassured that surgery at a later date does not put the patient at risk for a worse outcome. It was emphasized that the need for surgery is dictated by the patient's quality of life as influenced by the cataract. Patient was instructed to maintain close follow up with their general eye care doctor.   

## 2021-11-01 NOTE — Progress Notes (Signed)
11/01/2021     CHIEF COMPLAINT Patient presents for  Chief Complaint  Patient presents with   Retina Follow Up      HISTORY OF PRESENT ILLNESS: Johnathan Ross is a 51 y.o. male who presents to the clinic today for:   HPI     Retina Follow Up           Diagnosis: Diabetic Retinopathy   Laterality: both eyes   Severity: moderate   Course: stable         Comments   9 MOS FU OCT FP. Pt states vision is stable however it is a little blurry. Pt stated, "it feels like something is in my cornea, it feels scratchy like something is in my eye. I noticed last night after I used "REFRESH" I wake up with pain in my eye. There are some blurry vision when I look at small print. It was better when I first started [facility] but my symptoms from before start reoccurring."  Pt denies FOL but sees floaters.       Last edited by Silvestre Moment on 11/01/2021 11:08 AM.      Referring physician: Janith Lima, MD [place],  [place] [address]  HISTORICAL INFORMATION:   Selected notes from the MEDICAL RECORD NUMBER    Lab Results  Component Value Date   HGBA1C 7.6 (H) 08/21/2021     CURRENT MEDICATIONS: No current outpatient medications on file. (Ophthalmic Drugs)   No current facility-administered medications for this visit. (Ophthalmic Drugs)   Current Outpatient Medications (Other)  Medication Sig   baclofen (LIORESAL) 10 MG tablet Take one or two pills three times a day (up to 6 pills/day)   Continuous Blood Gluc Receiver (DEXCOM G5 RECEIVER KIT) DEVI by Does not apply route.   Continuous Blood Gluc Sensor (DEXCOM G6 SENSOR) MISC Use as directed.   Continuous Blood Gluc Transmit (DEXCOM G6 TRANSMITTER) MISC Use to monitor blood sugar   dalfampridine (AMPYRA) 10 MG TB12 Take 1 tablet (10 mg total) by mouth 2 (two) times daily.   Glucagon (GVOKE HYPOPEN 2-PACK) 1 MG/0.2ML SOAJ Inject 1 Act into the skin daily as needed.   glucose blood (CONTOUR NEXT TEST) test strip  USE 2 TIMES PER DAY. E10.65   losartan (COZAAR) 50 MG tablet TAKE 1 TABLET BY MOUTH EVERY DAY   NOVOLOG 100 UNIT/ML injection USE UP TO 80 UNITS IN INSULIN PUMP DAILY-DX CODE E10.8   OVER THE COUNTER MEDICATION Sea moss supplement-vitamin and minerals daily   simvastatin (ZOCOR) 20 MG tablet TAKE 1 TABLET BY MOUTH EVERYDAY AT BEDTIME   solifenacin (VESICARE) 5 MG tablet TAKE 1 TABLET (5 MG TOTAL) BY MOUTH DAILY.   SYNTHROID 125 MCG tablet TAKE 1 TABLET BY MOUTH 6 DAYS PER WEEK   No current facility-administered medications for this visit. (Other)      REVIEW OF SYSTEMS: ROS   Negative for: Constitutional, Gastrointestinal, Neurological, Skin, Genitourinary, Musculoskeletal, HENT, Endocrine, Cardiovascular, Eyes, Respiratory, Psychiatric, Allergic/Imm, Heme/Lymph Last edited by Silvestre Moment on 11/01/2021 11:08 AM.       ALLERGIES Allergies  Allergen Reactions   Lisinopril Cough   Ivp Dye [Iodinated Contrast Media] Hives and Itching    PAST MEDICAL HISTORY Past Medical History:  Diagnosis Date   Hyperlipidemia    Hypertension    Hypogonadism in male 11/01/2016   Hypothyroidism    Multiple sclerosis ([place])    Neuromuscular disorder ([place])    Proliferative diabetic retinopathy(362.02)  Type 1 diabetes mellitus ([place]) dx'd 1994   Vitamin D insufficiency 11/29/2016   Past Surgical History:  Procedure Laterality Date   EYE SURGERY Bilateral    "laser for diabetic retinopathy"   FRACTURE SURGERY     IR VENO/EXT/UNI LEFT  03/24/2020   OPEN REDUCTION INTERNAL FIXATION (ORIF) TIBIA/FIBULA FRACTURE Right 10/30/2016   ORIF ANKLE FRACTURE Left 2015   ORIF TIBIA FRACTURE Right 10/30/2016   Procedure: OPEN REDUCTION INTERNAL FIXATION (ORIF) TIBIA FIBULA FRACTURE;  Surgeon: Altamese [place], MD;  Location: [facility];  Service: Orthopedics;  Laterality: Right;   RETINAL LASER PROCEDURE Bilateral    "for diabetic retinopathy"    FAMILY HISTORY Family History  Problem Relation Age of Onset    Diabetes Sister    Colon polyps Maternal Uncle    Colon cancer Maternal Grandmother    Stomach cancer Cousin    Esophageal cancer Neg Hx     SOCIAL HISTORY Social History   Tobacco Use   Smoking status: Never   Smokeless tobacco: Never  Vaping Use   Vaping Use: Never used  Substance Use Topics   Alcohol use: No   Drug use: No         OPHTHALMIC EXAM:  Base Eye Exam     Visual Acuity (ETDRS)       Right Left   Dist [place] 20/20 -1 20/20 -1         Tonometry (Tonopen, 11:15 AM)       Right Left   Pressure 13 16         Pupils       Pupils APD   Right PERRL None   Left PERRL None         Visual Fields       Left Right    Full Full         Neuro/Psych     Oriented x3: Yes   Mood/Affect: Normal         Dilation     Both eyes: 1.0% Mydriacyl, 2.5% Phenylephrine @ 11:15 AM           Slit Lamp and Fundus Exam     External Exam       Right Left   External Normal Normal         Slit Lamp Exam       Right Left   Lids/Lashes Normal Normal   Conjunctiva/Sclera White and quiet, with large eyelash surrounded and case by purulent debris in the cul-de-sac and in the nasal fornix White and quiet   Cornea Clear Clear   Anterior Chamber Deep and quiet Deep and quiet   Iris Round and reactive Round and reactive   Lens 2+ Nuclear sclerosis 2+ Nuclear sclerosis   Anterior Vitreous Normal Normal         Fundus Exam       Right Left   Posterior Vitreous Clear vitrectomized Clear vitrectomized   Disc Old fibrous tissue superotemporal, inactive, no traction, no vascularity Normal   C/D Ratio 0.45 0.55   Macula Good focal, microaneurysms, no CSME Good focal, microaneurysms, no CSME   Vessels PDR-quiet PDR-quiet   Periphery Good PRP 360 Good PRP 360            IMAGING AND PROCEDURES  Imaging and Procedures for 11/01/21  OCT, Retina - OU - Both Eyes       Right Eye Quality was good. Scan locations included subfoveal. Central  Foveal Thickness: 259. Progression has been  stable. Findings include abnormal foveal contour, epiretinal membrane.   Left Eye Quality was good. Scan locations included subfoveal. Central Foveal Thickness: 251. Findings include abnormal foveal contour, epiretinal membrane.   Notes No active maculopathy OU.     Color Fundus Photography Optos - OU - Both Eyes       Right Eye Progression has been stable. Disc findings include increased cup to disc ratio. Macula : microaneurysms. Vessels : normal observations. Periphery : normal observations.   Left Eye Progression has been stable. Disc findings include increased cup to disc ratio. Macula : microaneurysms. Vessels : normal observations. Periphery : normal observations.   Notes Good PRP, PDR quiescent, no active disease, with good PRP             ASSESSMENT/PLAN:  Nuclear sclerotic cataract of both eyes The nature of cataract was discussed with the patient as well as the elective nature of surgery. The patient was reassured that surgery at a later date does not put the patient at risk for a worse outcome. It was emphasized that the need for surgery is dictated by the patient's quality of life as influenced by the cataract. Patient was instructed to maintain close follow up with their general eye care doctor.  Controlled type 1 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 ([place]) The nature of regressed proliferative diabetic retinopathy was discussed with the patient. The patient was advised to maintain good glucose, blood pressure, monitor kidney function and serum lipid control as advised by personal physician. Rare risk for reactivation of progression exist with untreated severe anemia, untreated renal failure, untreated heart failure, and smoking. Complete avoidance of smoking was recommended. The chance of recurrent proliferative diabetic retinopathy was discussed as well as the chance of vitreous hemorrhage  for which further treatments may be necessary.   Explained to the patient that the quiescent  proliferative diabetic retinopathy disease is unlikely to ever worsen.  Worsening factors would include however severe anemia, hypertension out-of-control or impending renal failure.  Right epiretinal membrane The nature of macular pucker (epiretinal membrane ERM) was discussed with the patient as well as threshold criteria for vitrectomy surgery. I explained that in rare cases another surgery is needed to actually remove a second wrinkle should it regrow.  Most often, the epiretinal membrane and underlying wrinkled internal limiting membrane are removed with the first surgery, to accomplish the goals.   If the operative eye is [place] (natural lens still present), cataract surgery is often recommended prior to Vitrectomy. This will enable the retina surgeon to have the best view during surgery and the patient to obtain optimal results in the future. Treatment options were discussed.  I have recommended at home monitoring the near vision task in a monocular (1 eye at a time), with or without near vision glasses, to look for changes or declines in reading.  OD no impact on acuity     ICD-10-CM   1. Controlled type 1 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 ([place])  E10.3553 OCT, Retina - OU - Both Eyes    Color Fundus Photography Optos - OU - Both Eyes    2. Nuclear sclerotic cataract of both eyes  H25.13     3. Right epiretinal membrane  H35.371       1.  Quiescent PDR OU.  Stable condition.  Acuity is remained stable.  Patient has minimal to no symptoms from cataract  2.  cataract age-appropriate are slightly worse than age due  to diabetes mellitus longstanding  3.  Ophthalmic Meds Ordered this visit:  No orders of the defined types were placed in this encounter.      Return in about 9 months (around 08/02/2022) for DILATE OU, COLOR FP, OCT.  There are no Patient  Instructions on file for this visit.   Explained the diagnoses, plan, and follow up with the patient and they expressed understanding.  Patient expressed understanding of the importance of proper follow up care.   Clent Demark Aaran Enberg M.D. Diseases & Surgery of the Retina and Vitreous Retina & Diabetic [place] 11/01/21     Abbreviations: M myopia (nearsighted); A astigmatism; H hyperopia (farsighted); P presbyopia; Mrx spectacle prescription;  CTL contact lenses; OD right eye; OS left eye; OU both eyes  XT exotropia; ET esotropia; PEK punctate epithelial keratitis; PEE punctate epithelial erosions; DES dry eye syndrome; MGD meibomian gland dysfunction; ATs artificial tears; PFAT's preservative free artificial tears; [place] nuclear sclerotic cataract; PSC posterior subcapsular cataract; ERM epi-retinal membrane; PVD posterior vitreous detachment; RD retinal detachment; DM diabetes mellitus; DR diabetic retinopathy; NPDR non-proliferative diabetic retinopathy; PDR proliferative diabetic retinopathy; CSME clinically significant macular edema; DME diabetic macular edema; dbh dot blot hemorrhages; CWS cotton wool spot; POAG primary open angle glaucoma; C/D cup-to-disc ratio; HVF humphrey visual field; GVF goldmann visual field; OCT optical coherence tomography; IOP intraocular pressure; BRVO Branch retinal vein occlusion; CRVO central retinal vein occlusion; CRAO central retinal artery occlusion; BRAO branch retinal artery occlusion; RT retinal tear; SB scleral buckle; PPV pars plana vitrectomy; VH Vitreous hemorrhage; PRP panretinal laser photocoagulation; IVK intravitreal kenalog; VMT vitreomacular traction; MH Macular hole;  NVD neovascularization of the disc; NVE neovascularization elsewhere; AREDS age related eye disease study; ARMD age related macular degeneration; POAG primary open angle glaucoma; EBMD epithelial/anterior basement membrane dystrophy; ACIOL anterior chamber intraocular lens; IOL intraocular  lens; PCIOL posterior chamber intraocular lens; Phaco/IOL phacoemulsification with intraocular lens placement; [place] photorefractive keratectomy; LASIK laser assisted in situ keratomileusis; HTN hypertension; DM diabetes mellitus; COPD chronic obstructive pulmonary disease

## 2021-11-02 ENCOUNTER — Encounter: Payer: Medicare Other | Admitting: Gastroenterology

## 2021-11-05 ENCOUNTER — Other Ambulatory Visit: Payer: Self-pay | Admitting: Neurology

## 2021-11-06 ENCOUNTER — Other Ambulatory Visit (INDEPENDENT_AMBULATORY_CARE_PROVIDER_SITE_OTHER): Payer: Medicare PPO

## 2021-11-06 DIAGNOSIS — E1065 Type 1 diabetes mellitus with hyperglycemia: Secondary | ICD-10-CM | POA: Diagnosis not present

## 2021-11-06 DIAGNOSIS — E7800 Pure hypercholesterolemia, unspecified: Secondary | ICD-10-CM

## 2021-11-06 DIAGNOSIS — E063 Autoimmune thyroiditis: Secondary | ICD-10-CM | POA: Diagnosis not present

## 2021-11-06 LAB — COMPREHENSIVE METABOLIC PANEL
ALT: 20 U/L (ref 0–53)
AST: 20 U/L (ref 0–37)
Albumin: 4.1 g/dL (ref 3.5–5.2)
Alkaline Phosphatase: 94 U/L (ref 39–117)
BUN: 13 mg/dL (ref 6–23)
CO2: 30 mEq/L (ref 19–32)
Calcium: 9.4 mg/dL (ref 8.4–10.5)
Chloride: 103 mEq/L (ref 96–112)
Creatinine, Ser: 1.26 mg/dL (ref 0.40–1.50)
GFR: 67.09 mL/min (ref >60.00)
Glucose, Bld: 136 mg/dL — ABNORMAL HIGH (ref 70–99)
Potassium: 3.6 mEq/L (ref 3.5–5.1)
Sodium: 139 mEq/L (ref 135–145)
Total Bilirubin: 0.5 mg/dL (ref 0.2–1.2)
Total Protein: 6.6 g/dL (ref 6.0–8.3)

## 2021-11-06 LAB — LIPID PANEL
Cholesterol: 112 mg/dL (ref 0–200)
HDL: 43.9 mg/dL (ref >39.00)
LDL Cholesterol: 61 mg/dL (ref 0–99)
NonHDL: 68.42
Total CHOL/HDL Ratio: 3
Triglycerides: 39 mg/dL (ref 0.0–149.0)
VLDL: 7.8 mg/dL (ref 0.0–40.0)

## 2021-11-06 LAB — TSH: TSH: 2.45 u[IU]/mL (ref 0.35–5.50)

## 2021-11-06 LAB — HEMOGLOBIN A1C: Hgb A1c MFr Bld: 7.4 % — ABNORMAL HIGH (ref 4.6–6.5)

## 2021-11-08 ENCOUNTER — Telehealth (INDEPENDENT_AMBULATORY_CARE_PROVIDER_SITE_OTHER): Payer: Medicare PPO | Admitting: Endocrinology

## 2021-11-08 ENCOUNTER — Encounter: Payer: Self-pay | Admitting: Endocrinology

## 2021-11-08 VITALS — Ht 73.0 in | Wt 198.0 lb

## 2021-11-08 DIAGNOSIS — E063 Autoimmune thyroiditis: Secondary | ICD-10-CM | POA: Diagnosis not present

## 2021-11-08 DIAGNOSIS — E1065 Type 1 diabetes mellitus with hyperglycemia: Secondary | ICD-10-CM | POA: Diagnosis not present

## 2021-11-08 DIAGNOSIS — E7800 Pure hypercholesterolemia, unspecified: Secondary | ICD-10-CM | POA: Diagnosis not present

## 2021-11-08 NOTE — Progress Notes (Signed)
Subjective:              Patient ID: Johnathan Ross, male   DOB: 01-18-1971, 51 y.o.   MRN: [id number]  I connected with the above-named patient by video enabled telemedicine application and verified that I am speaking with the correct person. The patient was explained the limitations of evaluation and management by telemedicine and the availability of in person appointments.  Patient also understood that there may be a patient responsible charge related to this service  Location of the patient: Patient's home  Location of the provider: Physician office Only the patient and myself were participating in the encounter The patient understood the above statements and agreed to proceed.   Diabetes   Diagnosis: Type 1 diabetes mellitus, date of diagnosis:  1995.   Insulin Pump followup:   CURRENT insulin pump:  Medtronic 770 G since 04/2021  HISTORY: An insulin pump has been in use since 03/13/11.   PUMP settings    Basal rate: 12 AM- 4 AM = 0.25, 4 AM- 10 PM = 1.5,10 PM = 0.25  Carbohydrate to insulin ratio: 8 AM = 1: 7, 12 noon = 1: 8.5, 10 PM = 0.25  Sensitivity settings: 12 AM = 1: 45, 7 AM until 8 a.m. = 1: 35 Blood sugar target 130 ACTIVE insulin time 3 hours  Recent history:  His A1c is improving at 7.4   Management reviewed from guardian sensor and pump download, current history and problems identified:   Compared to his last visit he has better time in range at 77% recently Also generally postprandial readings are not consistently high  Some of his high readings after dinner may be related to late boluses with relatively quick increase in blood sugars  However he has been able to stay in the auto mode 100%  Appears to have some overcorrection when he does supplemental boluses for high sugars especially in the evenings  However POSTPRANDIAL blood sugars by 3 hours appear to be relatively low  Generally blood sugars tend to be the highest late in the evening    Hypoglycemia has been infrequent overall, rarely around 3-4 PM or 7 PM  The guardian sensor download for the last 2 weeks is interpreted as follows  GMI is 6.9 for the last 2 weeks  Blood sugars are somewhat less variable in the mornings but overall coefficient of variation is similar Highest blood sugars are mostly late evening and lowest around 9-10 AM HYPERGLYCEMIC episodes have occurred 4 times at 2 PM, twice at 5 than 10 AM  times in the afternoon at 2-3 PM, also either at 8 or 9 PM more frequently OVERNIGHT blood sugars are mildly increased at midnight nearly 180 but usually improving and stable between 3 AM-8 AM   POSTPRANDIAL readings are on an average not higher but periodically may be higher after DINNER Blood sugars about 3 hours after the meal are relatively lower compared to Premeal blood sugar with some low readings at 3 hours after dinner  Blood sugar appears to be rising before bolus is active at dinnertime  Some of the high readings appear to be related to the bolus being delivered late especially in the evening HYPOGLYCEMIA in occurring infrequently   Data: CGM use % of time   2-week average/GV 149/31  Time in range 77       %  % Time Above 180 19  % Time above 250 3  % Time Below 70 1  PRE-MEAL Fasting Lunch Dinner Bedtime Overall  Glucose range:       Averages: 154 148 188 209/162    POST-MEAL PC Breakfast PC Lunch PC Dinner  Glucose range:     Averages: 135 144 166   Previously   CGM use % of time 86, auto mode 94%  2-week average/GV 150/32  Time in range        71%  % Time Above 180 24  % Time above 250 3  % Time Below 70 2     PRE-MEAL Fasting Lunch Dinner Bedtime Overall  Glucose range:       Averages:  170 165 190/186 154   POST-MEAL PC Breakfast PC Lunch PC Dinner  Glucose range:     Averages:  134 149   Previously:  CGM use % of time 89  2-week average/GV 137/39  Time in range        82%  % Time Above 180 16  % Time above  250   % Time Below 70 2     PRE-MEAL Fasting Lunch Dinner Bedtime Overall  Glucose range:       Averages: 134 144 159 162/139    POST-MEAL PC Breakfast PC Lunch PC Dinner  Glucose range:     Averages: 147 161 139     Wt Readings from Last 3 Encounters:  11/08/21 198 lb (89.8 kg)  10/05/21 198 lb (89.8 kg)  08/01/21 204 lb 5 oz (92.7 kg)     Lab Results  Component Value Date   HGBA1C 7.4 (H) 11/06/2021   HGBA1C 7.6 (H) 08/21/2021   HGBA1C 7.0 (H) 05/19/2021   Lab Results  Component Value Date   MICROALBUR <0.7 08/28/2021   LDLCALC 61 11/06/2021   CREATININE 1.26 11/06/2021     OTHER active problems discussed today: See review of systems    Allergies as of 11/08/2021       Reactions   Lisinopril Cough   Ivp Dye [iodinated Contrast Media] Hives, Itching        Medication List        Accurate as of November 08, 2021 11:59 PM. If you have any questions, ask your nurse or doctor.          baclofen 10 MG tablet Commonly known as: LIORESAL TAKE ONE OR TWO PILLS THREE TIMES A DAY (UP TO 6 PILLS/DAY)   Contour Next Test test strip Generic drug: glucose blood USE 2 TIMES PER DAY. E10.65   dalfampridine 10 MG Tb12 Commonly known as: Ampyra Take 1 tablet (10 mg total) by mouth 2 (two) times daily.   Dexcom G5 Receiver Kit Devi by Does not apply route.   Dexcom G6 Sensor Misc Use as directed.   Dexcom G6 Transmitter Misc Use to monitor blood sugar   Gvoke HypoPen 2-Pack 1 MG/0.2ML Soaj Generic drug: Glucagon Inject 1 Act into the skin daily as needed.   losartan 50 MG tablet Commonly known as: COZAAR TAKE 1 TABLET BY MOUTH EVERY DAY   NovoLOG 100 UNIT/ML injection Generic drug: insulin aspart USE UP TO 80 UNITS IN INSULIN PUMP DAILY-DX CODE E10.8   OVER THE COUNTER MEDICATION Sea moss supplement-vitamin and minerals daily   simvastatin 20 MG tablet Commonly known as: ZOCOR TAKE 1 TABLET BY MOUTH EVERYDAY AT BEDTIME   solifenacin 5 MG  tablet Commonly known as: VESICARE TAKE 1 TABLET (5 MG TOTAL) BY MOUTH DAILY.   Synthroid 125 MCG tablet Generic drug: levothyroxine TAKE 1 TABLET  BY MOUTH 6 DAYS PER WEEK        Allergies:  Allergies  Allergen Reactions   Lisinopril Cough   Ivp Dye [Iodinated Contrast Media] Hives and Itching    Past Medical History:  Diagnosis Date   Hyperlipidemia    Hypertension    Hypogonadism in male 11/01/2016   Hypothyroidism    Multiple sclerosis ([place])    Neuromuscular disorder ([place])    Proliferative diabetic retinopathy(362.02)    Type 1 diabetes mellitus ([place]) dx'd 1994   Vitamin D insufficiency 11/29/2016    Past Surgical History:  Procedure Laterality Date   EYE SURGERY Bilateral    "laser for diabetic retinopathy"   FRACTURE SURGERY     IR VENO/EXT/UNI LEFT  03/24/2020   OPEN REDUCTION INTERNAL FIXATION (ORIF) TIBIA/FIBULA FRACTURE Right 10/30/2016   ORIF ANKLE FRACTURE Left 2015   ORIF TIBIA FRACTURE Right 10/30/2016   Procedure: OPEN REDUCTION INTERNAL FIXATION (ORIF) TIBIA FIBULA FRACTURE;  Surgeon: Altamese [place], MD;  Location: [facility];  Service: Orthopedics;  Laterality: Right;   RETINAL LASER PROCEDURE Bilateral    "for diabetic retinopathy"    Family History  Problem Relation Age of Onset   Diabetes Sister    Colon polyps Maternal Uncle    Colon cancer Maternal Grandmother    Stomach cancer Cousin    Esophageal cancer Neg Hx     Social History:  reports that he has never smoked. He has never used smokeless tobacco. He reports that he does not drink alcohol and does not use drugs.    Review of Systems      HYPERCHOLESTEROLEMIA: He has been taking simvastatin 20 mg daily  LDL at goal  Lab Results  Component Value Date   CHOL 112 11/06/2021   HDL 43.90 11/06/2021   LDLCALC 61 11/06/2021   LDLDIRECT 65.0 02/09/2021   TRIG 39.0 11/06/2021   CHOLHDL 3 11/06/2021   Lab Results  Component Value Date   ALT 20 11/06/2021    HYPERTENSION: He had  been on lisinopril with usually good control  of blood pressure  He is on losartan 50 mg daily No recent office BP readings    BP Readings from Last 3 Encounters:  08/24/21 (!) 146/86  08/01/21 118/76  05/24/21 104/70    He has MULTIPLE sclerosis, is followed regularly by neurologist  HYPOTHYROIDISM: He has had longstanding primary 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 autoimmune  His TSH previously had been frequently variable and not clear why it fluctuates, more recently been requiring lower doses He also does not feel any different whether his thyroid level is high or low He generally has a tendency of feeling sleepy during the day  His dose has been 125 mcg, 6 1/2 days a week  He is getting brand-name Synthroid consistently and is using 90-day prescriptions  He is regular with taking his supplement before breakfast daily TSH history as follows:   Lab Results  Component Value Date   TSH 2.45 11/06/2021   TSH 1.31 08/22/2021   TSH 4.14 05/19/2021   FREET4 0.95 05/19/2021   FREET4 1.06 07/12/2020   FREET4 0.75 03/14/2020        Objective:   Physical Exam  Ht 6' 1" (1.854 m)   Wt 198 lb (89.8 kg)   BMI 26.12 kg/m            Assessment:      DIABETES type I on insulin pump:   See history of present illness for detailed discussion of current diabetes  management, blood sugar patterns and problems identified  A1c is 7.4  Overall blood sugars are somewhat better but still not well controlled after meals especially dinner He will benefit from increasing the active insulin time to 3 and half hours Also try to bolus ahead of time especially at dinnertime Hypoglycemia has been minimal  HYPOTHYROIDISM:  TSH normal  Continue the same regimen  LIPIDS: Well-controlled  HYPERTENSION: Needs office reading      Plan:       He needs to take his bolus for his meals especially at dinnertime at least 10 minutes before eating unless blood sugar is low normal Discussed  that this will help reduce postprandial spikes in the evening which are occurring frequently Also he will then not need to take correction boluses  Also for now he will extend his active insulin time to 3 and half hours instead of 3 Reminded him that when he is able to get the 780 upgrade on his pump he will need to use a new sensor also Discussed benefits of the new pump  Synthroid dosage will be continued at 6-1/2 tablets a week of the 125 mcg  Follow-up in 3 months   There are no Patient Instructions on file for this visit.   Total visit time including counseling = 30 minutes  Johnathan Ross  Note: This office note was prepared with Estate agent. Any transcriptional errors that result from this process are unintentional.

## 2021-11-16 ENCOUNTER — Telehealth: Payer: Self-pay

## 2021-11-16 NOTE — Telephone Encounter (Signed)
I called patient on 11/14/21 because I received message that Johnathan Ross had not received paperwork that they need to send patient his pump supplies. Informed patient that the only thing we received was a request for last office visit which was sent twice. I called [facility] to see exactly what they needed and they stated they needed CMN completed and faxed. I asked rep to fax it to [phone] and I would get Dr Dwyane Dee to complete. Fax received and completed. I also faxed CMN to [phone] which was the fax number that was given to me and on the CMN fax. Received confirmation that it went through successfully.

## 2021-11-17 ENCOUNTER — Encounter: Payer: Self-pay | Admitting: Gastroenterology

## 2021-11-19 ENCOUNTER — Other Ambulatory Visit: Payer: Self-pay | Admitting: Endocrinology

## 2021-11-19 DIAGNOSIS — E782 Mixed hyperlipidemia: Secondary | ICD-10-CM

## 2021-11-20 ENCOUNTER — Telehealth: Payer: Self-pay | Admitting: Gastroenterology

## 2021-11-20 NOTE — Telephone Encounter (Signed)
Inbound call from patient stating that he is scheduled to have colonoscopy on  6/28 at 11:00. Patient stated that he was looking over his instructions last night and seen where he was supposed to not have nuts or popcorn after Friday. Patient stated he had both over the weekend. Patient is requesting a call back to discuss if he needs to reschedule or if he is okay to have procedure. Please advise.

## 2021-11-21 ENCOUNTER — Encounter: Payer: Self-pay | Admitting: Endocrinology

## 2021-11-22 ENCOUNTER — Encounter: Payer: Self-pay | Admitting: Gastroenterology

## 2021-11-22 ENCOUNTER — Ambulatory Visit (AMBULATORY_SURGERY_CENTER): Payer: Medicare PPO | Admitting: Gastroenterology

## 2021-11-22 VITALS — BP 114/59 | HR 75 | Temp 99.3°F | Resp 15 | Ht 73.0 in | Wt 198.0 lb

## 2021-11-22 DIAGNOSIS — Z1211 Encounter for screening for malignant neoplasm of colon: Secondary | ICD-10-CM | POA: Diagnosis not present

## 2021-11-22 MED ORDER — SODIUM CHLORIDE 0.9 % IV SOLN: 500.0000 mL | Freq: Once | INTRAVENOUS | Status: DC

## 2021-11-22 NOTE — Progress Notes (Signed)
See 11/08/2021 H&P, no changes

## 2021-11-22 NOTE — Progress Notes (Signed)
VS completed by DT.  Pt's states no medical or surgical changes since previsit or office visit.  

## 2021-11-22 NOTE — Patient Instructions (Signed)
YOU HAD AN ENDOSCOPIC PROCEDURE TODAY AT THE [facility]:   Refer to the procedure report that was given to you for any specific questions about what was found during the examination.  If the procedure report does not answer your questions, please call your gastroenterologist to clarify.  If you requested that your care partner not be given the details of your procedure findings, then the procedure report has been included in a sealed envelope for you to review at your convenience later.  YOU SHOULD EXPECT: Some feelings of bloating in the abdomen. Passage of more gas than usual.  Walking can help get rid of the air that was put into your GI tract during the procedure and reduce the bloating. If you had a lower endoscopy (such as a colonoscopy or flexible sigmoidoscopy) you may notice spotting of blood in your stool or on the toilet paper. If you underwent a bowel prep for your procedure, you may not have a normal bowel movement for a few days.  Please Note:  You might notice some irritation and congestion in your nose or some drainage.  This is from the oxygen used during your procedure.  There is no need for concern and it should clear up in a day or so.  SYMPTOMS TO REPORT IMMEDIATELY:  Following lower endoscopy (colonoscopy or flexible sigmoidoscopy):  Excessive amounts of blood in the stool  Significant tenderness or worsening of abdominal pains  Swelling of the abdomen that is new, acute  Fever of 100F or higher  Following upper endoscopy (EGD)  Vomiting of blood or coffee ground material  New chest pain or pain under the shoulder blades  Painful or persistently difficult swallowing  New shortness of breath  Fever of 100F or higher  Black, tarry-looking stools  For urgent or emergent issues, a gastroenterologist can be reached at any hour by calling [phone]. Do not use MyChart messaging for urgent concerns.    DIET:  We do recommend a small meal at first, but  then you may proceed to your regular diet.  Drink plenty of fluids but you should avoid alcoholic beverages for 24 hours.  ACTIVITY:  You should plan to take it easy for the rest of today and you should NOT DRIVE or use heavy machinery until tomorrow (because of the sedation medicines used during the test).    FOLLOW UP: Our staff will call the number listed on your records the next business day following your procedure.  We will call around 7:15- 8:00 am to check on you and address any questions or concerns that you may have regarding the information given to you following your procedure. If we do not reach you, we will leave a message.  If you develop any symptoms (ie: fever, flu-like symptoms, shortness of breath, cough etc.) before then, please call [phone].  If you test positive for Covid 19 in the 2 weeks post procedure, please call and report this information to us.    If any biopsies were taken you will be contacted by phone or by letter within the next 1-3 weeks.  Please call us at [phone] if you have not heard about the biopsies in 3 weeks.    SIGNATURES/CONFIDENTIALITY: You and/or your care partner have signed paperwork which will be entered into your electronic medical record.  These signatures attest to the fact that that the information above on your After Visit Summary has been reviewed and is understood.  Full responsibility of the confidentiality   of this discharge information lies with you and/or your care-partner.  

## 2021-11-22 NOTE — Op Note (Signed)
[facility] Patient Name: Johnathan Ross Procedure Date: 11/22/2021 10:48 AM MRN: [id number] Endoscopist: Ladene Artist , MD Age: 51 Referring MD:  Date of Birth: Jun 03, 1970 Gender: Male Account #: [id number] Procedure:                Colonoscopy Indications:              Screening for colorectal malignant neoplasm Medicines:                Monitored Anesthesia Care Procedure:                Pre-Anesthesia Assessment:                           - Prior to the procedure, a History and Physical                            was performed, and patient medications and                            allergies were reviewed. The patient's tolerance of                            previous anesthesia was also reviewed. The risks                            and benefits of the procedure and the sedation                            options and risks were discussed with the patient.                            All questions were answered, and informed consent                            was obtained. Prior Anticoagulants: The patient has                            taken no previous anticoagulant or antiplatelet                            agents. ASA Grade Assessment: II - A patient with                            mild systemic disease. After reviewing the risks                            and benefits, the patient was deemed in                            satisfactory condition to undergo the procedure.                           After obtaining informed consent, the colonoscope  was passed under direct vision. Throughout the                            procedure, the patient's blood pressure, pulse, and                            oxygen saturations were monitored continuously. The                            CF [id number] #[id number] was introduced through the anus                            and advanced to the the cecum, identified by                            appendiceal orifice  and ileocecal valve. The                            ileocecal valve, appendiceal orifice, and rectum                            were photographed. The quality of the bowel                            preparation was excellent. The colonoscopy was                            performed without difficulty. The patient tolerated                            the procedure well. Scope In: 11:05:07 AM Scope Out: 11:20:59 AM Scope Withdrawal Time: 0 hours 11 minutes 19 seconds  Total Procedure Duration: 0 hours 15 minutes 52 seconds  Findings:                 The perianal and digital rectal examinations were                            normal.                           The entire examined colon appeared normal on direct                            and retroflexion views. Complications:            No immediate complications. Estimated blood loss:                            None. Estimated Blood Loss:     Estimated blood loss: none. Impression:               - The entire examined colon is normal on direct and                            retroflexion views.                           -  No specimens collected. Recommendation:           - Repeat colonoscopy in 10 years for screening                            purposes.                           - Patient has a contact number available for                            emergencies. The signs and symptoms of potential                            delayed complications were discussed with the                            patient. Return to normal activities tomorrow.                            Written discharge instructions were provided to the                            patient.                           - Resume previous diet.                           - Continue present medications. Ladene Artist, MD 11/22/2021 11:23:20 AM This report has been signed electronically.

## 2021-11-22 NOTE — Progress Notes (Signed)
Report to pacu rn; vss. Care resumed by pacu rn.

## 2021-11-23 ENCOUNTER — Telehealth: Payer: Self-pay

## 2021-11-23 NOTE — Telephone Encounter (Signed)
  Follow up Call-     11/22/2021   10:32 AM  Call back number  Post procedure Call Back phone  # [phone]  Permission to leave phone message Yes     Patient questions:  Do you have a fever, pain , or abdominal swelling? No. Pain Score  0 *  Have you tolerated food without any problems? Yes.    Have you been able to return to your normal activities? Yes.    Do you have any questions about your discharge instructions: Diet   No. Medications  No. Follow up visit  No.  Do you have questions or concerns about your Care? No.  Actions: * If pain score is 4 or above: No action needed, pain <4.

## 2021-11-30 DIAGNOSIS — E1065 Type 1 diabetes mellitus with hyperglycemia: Secondary | ICD-10-CM | POA: Diagnosis not present

## 2021-12-06 ENCOUNTER — Other Ambulatory Visit: Payer: Self-pay | Admitting: Endocrinology

## 2021-12-11 ENCOUNTER — Other Ambulatory Visit: Payer: Self-pay | Admitting: Internal Medicine

## 2021-12-11 DIAGNOSIS — N3281 Overactive bladder: Secondary | ICD-10-CM

## 2022-01-18 ENCOUNTER — Ambulatory Visit: Payer: Medicare Other | Admitting: Neurology

## 2022-01-24 ENCOUNTER — Other Ambulatory Visit: Payer: Self-pay | Admitting: Endocrinology

## 2022-02-02 ENCOUNTER — Other Ambulatory Visit: Payer: Self-pay | Admitting: Neurology

## 2022-02-07 ENCOUNTER — Encounter: Payer: Self-pay | Admitting: Neurology

## 2022-02-07 ENCOUNTER — Other Ambulatory Visit (INDEPENDENT_AMBULATORY_CARE_PROVIDER_SITE_OTHER): Payer: Medicare PPO

## 2022-02-07 ENCOUNTER — Ambulatory Visit: Payer: Medicare PPO | Admitting: Neurology

## 2022-02-07 VITALS — BP 122/78 | HR 81 | Ht 73.0 in | Wt 198.0 lb

## 2022-02-07 DIAGNOSIS — R269 Unspecified abnormalities of gait and mobility: Secondary | ICD-10-CM

## 2022-02-07 DIAGNOSIS — G830 Diplegia of upper limbs: Secondary | ICD-10-CM

## 2022-02-07 DIAGNOSIS — E1065 Type 1 diabetes mellitus with hyperglycemia: Secondary | ICD-10-CM

## 2022-02-07 DIAGNOSIS — G35 Multiple sclerosis: Secondary | ICD-10-CM | POA: Diagnosis not present

## 2022-02-07 DIAGNOSIS — R39198 Other difficulties with micturition: Secondary | ICD-10-CM

## 2022-02-07 LAB — BASIC METABOLIC PANEL
BUN: 12 mg/dL (ref 6–23)
CO2: 28 mEq/L (ref 19–32)
Calcium: 8.9 mg/dL (ref 8.4–10.5)
Chloride: 104 mEq/L (ref 96–112)
Creatinine, Ser: 1.08 mg/dL (ref 0.40–1.50)
GFR: 80.58 mL/min (ref >60.00)
Glucose, Bld: 171 mg/dL — ABNORMAL HIGH (ref 70–99)
Potassium: 4.3 mEq/L (ref 3.5–5.1)
Sodium: 139 mEq/L (ref 135–145)

## 2022-02-07 LAB — HEMOGLOBIN A1C: Hgb A1c MFr Bld: 7.4 % — ABNORMAL HIGH (ref 4.6–6.5)

## 2022-02-07 MED ORDER — SILODOSIN 8 MG PO CAPS
8.0000 mg | ORAL_CAPSULE | Freq: Every day | ORAL | 11 refills | Status: DC
Start: 2022-02-07 — End: 2023-08-26

## 2022-02-07 NOTE — Progress Notes (Signed)
[facility]  PATIENT: Johnathan Ross DOB: [birth year]  REFERRING DOCTOR OR PCP: Jodi Mourning, FNP SOURCE: Patient, notes from Dr. Bjorn Loser (neurology), multiple MRI reports and images reviewed, lab reports.  _________________________________   HISTORICAL  CHIEF COMPLAINT:  Chief Complaint  Patient presents with   Follow-up    Pt alone, rm 2. Presents today for a MS follow up visit. DMT [place] Falling    HISTORY OF PRESENT ILLNESS:  Johnathan Ross is a 51 year old man who was diagnosed with relapsing remitting MS around 2007.     Update 02/07/2022 He had his 2 years of [place] therapy in 2016 and 2017.  He tolerated the infusions well but has had continued worsening of gait and spasticity.  He denies any exacerbation.  However, he does note mild worsening over the past few years with his gait and leg strength.   MRI of the brain 07/27/2019 ([facility]) did not show any new lesions.  He uses furniture/walls around the house and  uses a walker mostly outside or a wheelchair for longer distance. He can go > 100 feet with a walker.    He has a lot of spasticity and is taking baclofen up to 9 pills a day (30 mg bid most days and tid some days).       He feels a little sleepy on high dose.   In the past he took tizanidine.  Spasticity increases when he uses his walker and lifts up with his arms.  Spasticity is also present at night.   He had a single series of Botox injections in November 2020 but felt weaker without any benefit of his spasticity.      He also takes dalfampridine 10 mg po bid and feels that there is some benefit..   The right leg is mildly weaker than the left leg. . Both legs spasm up, a little worse on the right than the left of note, the spasticity helps him support his weight when he walks.  Arms are strong.  He uses the walker to walk mostly by lifting with his arms.      He notes mild numbness in his feet.  There is no dysesthetic pain in the feet.  He also has  Type 1 DM in 1995 and has been on insulin.  On the insulin pump his HgBA1c is usually 7's.  Before it was mostly 11-13.   He denies difficulties with his vision.  He has urinary urgency but also has some hesitancy and does not completely empty (usually has to go back one or two more times).  Flomax had not helped much so he stopped.  Vesicare has not helped much.   We discussed seeing urology.    He reports some fatigue.  He snores and walkes up some but has never had gasping, snorting or pauses noted.   He denies much difficulties with cognition.  He denies mood disturbance.     MS history:  In 2007, he presented with spasms in his left leg.   He saw Dr. Bjorn Loser in [place] and was started Rebif and had some progression and also had difficulty tolerating it.    He went on [place] in 2016 and had his second year in 2017.        Imaging studies: MRIs of the brain and cervical spine from 07/27/2019:  The MRI of the spine shows several T2 hyperintense foci, located posterior laterally bilaterally, right larger than left adjacent to C2-C3, laterally to the left  at C3-C4, centrally at C4-C5 also associated with spinal stenosis.  The MRI of the brain shows multiple T2/FLAIR hyperintense foci in the periventricular and juxtacortical white matter and a couple punctate foci in the cerebellum.  None of the foci appear to be acute.  Other medical history: He has type 1 diabetes mellitus and is on insulin (pump).  He also has hypothyroidism and is on Synthroid.  He has well-controlled hypertension.  REVIEW OF SYSTEMS: Constitutional: No fevers, chills, sweats, or change in appetite.  He notes some fatigue. Eyes: No visual changes, double vision, eye pain.  He has had laser eye surgery Ear, nose and throat: No hearing loss, ear pain, nasal congestion, sore throat Cardiovascular: No chest pain, palpitations Respiratory:  No shortness of breath at rest or with exertion.   No wheezes GastrointestinaI: No  nausea, vomiting, diarrhea, abdominal pain, fecal incontinence Genitourinary:  No dysuria, urinary retention or frequency.  No nocturia. Musculoskeletal:  No neck pain, back pain Integumentary: No rash, pruritus, skin lesions Neurological: as above Psychiatric: No depression at this time.  No anxiety Endocrine: No palpitations, diaphoresis, change in appetite, change in weigh or increased thirst Hematologic/Lymphatic:  No anemia, purpura, petechiae. Allergic/Immunologic: No itchy/runny eyes, nasal congestion, recent allergic reactions, rashes  ALLERGIES: Allergies  Allergen Reactions   Lisinopril Cough   Ivp Dye [Iodinated Contrast Media] Hives and Itching    HOME MEDICATIONS:  Current Outpatient Medications:    baclofen (LIORESAL) 10 MG tablet, TAKE 1 TO 2 TABLETS THREE TIMES A DAY (UP TO 6 PILLS/DAY), Disp: 540 tablet, Rfl: 0   Continuous Blood Gluc Receiver (DEXCOM G5 RECEIVER KIT) DEVI, by Does not apply route., Disp: , Rfl:    Continuous Blood Gluc Sensor (DEXCOM G6 SENSOR) MISC, Use as directed., Disp: 12 each, Rfl: 1   Continuous Blood Gluc Transmit (DEXCOM G6 TRANSMITTER) MISC, Use to monitor blood sugar, Disp: 1 each, Rfl: 0   dalfampridine (AMPYRA) 10 MG TB12, Take 1 tablet (10 mg total) by mouth 2 (two) times daily., Disp: 180 tablet, Rfl: 3   Glucagon (GVOKE HYPOPEN 2-PACK) 1 MG/0.2ML SOAJ, Inject 1 Act into the skin daily as needed., Disp: 2 mL, Rfl: 5   glucose blood (CONTOUR NEXT TEST) test strip, USE 2 TIMES PER DAY. E10.65, Disp: 200 strip, Rfl: 3   losartan (COZAAR) 50 MG tablet, TAKE 1 TABLET BY MOUTH EVERY DAY, Disp: 90 tablet, Rfl: 3   NOVOLOG 100 UNIT/ML injection, USE UP TO 80 UNITS IN INSULIN PUMP DAILY-DX CODE E10.8, Disp: 70 mL, Rfl: 0   OVER THE COUNTER MEDICATION, Sea moss supplement-vitamin and minerals daily, Disp: , Rfl:    simvastatin (ZOCOR) 20 MG tablet, TAKE 1 TABLET BY MOUTH EVERYDAY AT BEDTIME, Disp: 90 tablet, Rfl: 3   solifenacin (VESICARE) 5 MG  tablet, TAKE 1 TABLET (5 MG TOTAL) BY MOUTH DAILY., Disp: 90 tablet, Rfl: 0   SYNTHROID 125 MCG tablet, TAKE 1 TABLET BY MOUTH 6 DAYS PER WEEK, Disp: 72 tablet, Rfl: 1  PAST MEDICAL HISTORY: Past Medical History:  Diagnosis Date   Hyperlipidemia    Hypertension    Hypogonadism in male 11/01/2016   Hypothyroidism    Multiple sclerosis ([place])    Neuromuscular disorder ([place])    Proliferative diabetic retinopathy(362.02)    Type 1 diabetes mellitus ([place]) dx'd 1994   Vitamin D insufficiency 11/29/2016    PAST SURGICAL HISTORY: Past Surgical History:  Procedure Laterality Date   EYE SURGERY Bilateral    "laser for diabetic retinopathy"  FRACTURE SURGERY     IR VENO/EXT/UNI LEFT  03/24/2020   OPEN REDUCTION INTERNAL FIXATION (ORIF) TIBIA/FIBULA FRACTURE Right 10/30/2016   ORIF ANKLE FRACTURE Left 2015   ORIF TIBIA FRACTURE Right 10/30/2016   Procedure: OPEN REDUCTION INTERNAL FIXATION (ORIF) TIBIA FIBULA FRACTURE;  Surgeon: Altamese Mooresboro, MD;  Location: [facility];  Service: Orthopedics;  Laterality: Right;   RETINAL LASER PROCEDURE Bilateral    "for diabetic retinopathy"    FAMILY HISTORY: Family History  Problem Relation Age of Onset   Diabetes Sister    Colon polyps Maternal Uncle    Colon cancer Maternal Grandmother    Stomach cancer Cousin    Esophageal cancer Neg Hx     SOCIAL HISTORY:  Social History   Socioeconomic History   Marital status: Single    Spouse name: 12   Number of children: 1   Years of education: Not on file   Highest education level: Not on file  Occupational History   Occupation: unemployed  Tobacco Use   Smoking status: Never   Smokeless tobacco: Never  Vaping Use   Vaping Use: Never used  Substance and Sexual Activity   Alcohol use: No   Drug use: No   Sexual activity: Not on file  Other Topics Concern   Not on file  Social History Narrative   Right handed   No caffeine    Lives with girlfriend   Social Determinants of Health    Financial Resource Strain: Low Risk  (09/07/2021)   Overall Financial Resource Strain (CARDIA)    Difficulty of Paying Living Expenses: Not hard at all  Food Insecurity: No Food Insecurity (09/07/2021)   Hunger Vital Sign    Worried About Running Out of Food in the Last Year: Never true    Ran Out of Food in the Last Year: Never true  Transportation Needs: No Transportation Needs (09/07/2021)   PRAPARE - Hydrologist (Medical): No    Lack of Transportation (Non-Medical): No  Physical Activity: Sufficiently Active (09/07/2021)   Exercise Vital Sign    Days of Exercise per Week: 5 days    Minutes of Exercise per Session: 30 min  Stress: No Stress Concern Present (09/07/2021)   [place]    Feeling of Stress : Not at all  Social Connections: Moderately Isolated (09/07/2021)   Social Connection and Isolation Panel [NHANES]    Frequency of Communication with Friends and Family: More than three times a week    Frequency of Social Gatherings with Friends and Family: More than three times a week    Attends Religious Services: Never    Marine scientist or Organizations: No    Attends Archivist Meetings: Never    Marital Status: Living with partner  Intimate Partner Violence: Not At Risk (09/07/2021)   Humiliation, Afraid, Rape, and Kick questionnaire    Fear of Current or Ex-Partner: No    Emotionally Abused: No    Physically Abused: No    Sexually Abused: No     PHYSICAL EXAM  Vitals:   02/07/22 0937  BP: 122/78  Pulse: 81  Weight: 198 lb (89.8 kg)  Height: '6\' 1"'  (1.854 m)    Body mass index is 26.12 kg/m.  No results found.  General: The patient is well-developed and well-nourished and in no acute distress   Skin: Extremities are without rash or  edema.  Neurologic Exam  Mental status:  The patient is alert and oriented x 3 at the time of the examination. The  patient has apparent normal recent and remote memory, with an apparently normal attention span and concentration ability.   Speech is normal.  Cranial nerves: Extraocular movements are full   Color vision was symmetric.  Facial symmetry is present.  Facial strength was normal.  Trapezius strength was normal no obvious hearing deficits are noted.  Motor:  Muscle bulk is normal.   Muscle tone is increased in the legs.  Strength is 5/5 in the arms, 3 to 4 - in the hip flexors, 4 in the leg extenders, 4- in the gastrocnemius muscles and 3  with ankle extensors and toe extensors.  The right was slightly weaker than the left.  Sensory: Sensory testing is intact to pinprick, soft touch and vibration sensation in the arms but he has reduced vibration sensation and reduced touch sensation in the feet vibration sensation was just mildly reduced at the ankle.  Coordination: Cerebellar testing reveals good finger-nose-finger and unable to do  heel-to-shin bilaterally.  Gait and station: He can rise up from a chair using his arms to support..  Cannot walk without using strong bilateral support.  Reflexes: Deep tendon reflexes are normal in the arms and knees and absent at the toes.  Babinski responses are extensor.    DIAGNOSTIC DATA (LABS, IMAGING, TESTING) - I reviewed patient records, labs, notes, testing and imaging myself where available.  Lab Results  Component Value Date   WBC 9.1 03/23/2020   HGB 15.5 03/23/2020   HCT 48.1 03/23/2020   MCV 90 03/23/2020   PLT 281 03/23/2020      Component Value Date/Time   NA 139 11/06/2021 0854   K 3.6 11/06/2021 0854   CL 103 11/06/2021 0854   CO2 30 11/06/2021 0854   GLUCOSE 136 (H) 11/06/2021 0854   GLUCOSE 147 (H) 05/24/2006 1022   BUN 13 11/06/2021 0854   CREATININE 1.26 11/06/2021 0854   CALCIUM 9.4 11/06/2021 0854   CALCIUM 8.7 10/31/2016 0415   PROT 6.6 11/06/2021 0854   ALBUMIN 4.1 11/06/2021 0854   AST 20 11/06/2021 0854   ALT 20  11/06/2021 0854   ALKPHOS 94 11/06/2021 0854   BILITOT 0.5 11/06/2021 0854   GFRNONAA 75 07/28/2019 0000   GFRAA 87 07/28/2019 0000   Lab Results  Component Value Date   CHOL 112 11/06/2021   HDL 43.90 11/06/2021   LDLCALC 61 11/06/2021   LDLDIRECT 65.0 02/09/2021   TRIG 39.0 11/06/2021   CHOLHDL 3 11/06/2021   Lab Results  Component Value Date   HGBA1C 7.4 (H) 11/06/2021   No results found for: "VITAMINB12" Lab Results  Component Value Date   TSH 2.45 11/06/2021       ASSESSMENT AND PLAN  Multiple sclerosis ([place]) - Plan: MR BRAIN W WO CONTRAST, MR CERVICAL SPINE W [place], Ambulatory referral to Physical Therapy  Gait disturbance - Plan: MR BRAIN W WO CONTRAST, MR CERVICAL SPINE W WO CONTRAST, Ambulatory referral to Physical Therapy  Acquired diplegia ([place])  Urinary dysfunction   1.  Although the MS appears to be stable since doing 2 years of [place] in 2016 and 2017, he has had some progression especially with more spasticity in the legs.  His last MRI (07/27/2019) showed no new lesions.  Since he has had some increasing spasticity and bladder issues we will check MRI of the brain and cervical spine to determine if there is breakthrough activity.  If this is occurring we need to start a disease modifying therapy again.  2.   Continue baclofen but go back to 5 to 6 pills a day.  I will add nighttime diazepam 5 mg.. 3.   Trial of Rapaflo for urinary hesitancy.  We discussed that if it does not help that we can refer to urology to make sure that there is not a structural issue.   4.   Stay active and walk as tolerated with walker 5.   He will return to see [place] in 6 months or sooner for new or worsening neurologic symptoms.  Syndi Pua A. Felecia Shelling, MD, [facility] 1/44/8185, 63:14 AM Certified in Neurology, Clinical Neurophysiology, Sleep Medicine and Neuroimaging  [facility] [address] [place], [place] [address] [phone]

## 2022-02-09 ENCOUNTER — Other Ambulatory Visit: Payer: Medicare PPO

## 2022-02-13 ENCOUNTER — Telehealth: Payer: Self-pay | Admitting: Neurology

## 2022-02-13 ENCOUNTER — Telehealth (INDEPENDENT_AMBULATORY_CARE_PROVIDER_SITE_OTHER): Payer: Medicare PPO | Admitting: Endocrinology

## 2022-02-13 DIAGNOSIS — E063 Autoimmune thyroiditis: Secondary | ICD-10-CM

## 2022-02-13 DIAGNOSIS — E7800 Pure hypercholesterolemia, unspecified: Secondary | ICD-10-CM

## 2022-02-13 DIAGNOSIS — E1065 Type 1 diabetes mellitus with hyperglycemia: Secondary | ICD-10-CM | POA: Diagnosis not present

## 2022-02-13 NOTE — Telephone Encounter (Signed)
Mcarthur Rossetti Josem Kaufmann: [id number] exp. 02/13/22-03/15/22 sent to GI

## 2022-02-13 NOTE — Progress Notes (Addendum)
Subjective:              Patient ID: Johnathan Ross, male   DOB: February 27, 1971, 51 y.o.   MRN: [id number]  I connected with the above-named patient by video enabled telemedicine application and verified that I am speaking with the correct person. The patient was explained the limitations of evaluation and management by telemedicine and the availability of in person appointments.  Patient also understood that there may be a patient responsible charge related to this service  Location of the patient: Patient's home  Location of the provider: Physician office Only the patient and myself were participating in the encounter The patient understood the above statements and agreed to proceed.   Diabetes   Diagnosis: Type 1 diabetes mellitus, date of diagnosis:  1995.   Insulin Pump followup:   CURRENT insulin pump:  Medtronic 780 G  HISTORY: An insulin pump has been in use since 03/13/11.   PUMP settings    Basal rate: 12 AM- 4 AM = 0.25, 4 AM- 10 PM = 1.5,10 PM = 0.25  Carbohydrate to insulin ratio: 8 AM = 1: 7, 12 noon = 1: 8.5, 10 PM = 0.25  Sensitivity settings: 12 AM = 1: 45, 7 AM until 8 a.m. = 1: 35 Blood sugar target 130 ACTIVE insulin time 3 hours  Recent history:  His A1c is improving at 7.4   Management reviewed from guardian sensor and pump download, current history and problems identified:   He just started using the 780 setting For his pump 3 days ago Compared to his last visit he has about the same time in range at 79 % recently His pump was downloaded yesterday and the blood sugar patterns are reviewed as below Again periodically he has high postprandial readings mostly in the evenings after dinner However this is somewhat inconsistent and likely partly related to delayed boluses at the time of the meals Also may have some decline in blood sugars at 3 hours  He is still continuing his active insulin time of 3 and half hours on the 780 upgrade However he has  not received his sensors Also as before his blood sugars are not generally trending to be higher overall late evening and sometimes after his breakfast He thinks he is bolusing 15 minutes before eating but does not appear to have active insulin before blood sugars start rising at meals Hypoglycemia has been infrequent overall, rarely around 3-4 PM or 7 PM  The guardian sensor download for the last 2 weeks is interpreted as follows  GMI is 6.9 for the last 2 weeks  Blood sugars are generally trending higher late morning after 10 AM and also continue to be moderately increased after about 8 PM on an average  Meal boluses were analyzed mostly at lunch and dinner and some after 10 PM  With this his blood sugars appear to be significantly high at the time of the bolus indicating delayed action of boluses Otherwise postprandial rise in blood sugar is minimal Also blood sugars tend to be lower by about 3 hours including occasional low normal readings  Occasionally postprandial readings may go up very significantly especially at dinnertime but rarely late morning also  OVERNIGHT blood sugars are mildly increased at midnight around 180 but subsequently gradually improving and stable between 5 AM- 10 AM  Some of the high readings appear to be related to the bolus being delivered late especially in the evening HYPOGLYCEMIA not clearly documented except low  normal readings in the afternoon or evening   Data:  CGM use % of time 95  2-week average/GV 151+/-40  Time in range        79%  % Time Above 180 14  % Time above 250 6  % Time Below 70 1     PRE-MEAL Fasting Lunch Dinner Bedtime Overall  Glucose range:       Averages:  175 188 232/198    POST-MEAL PC Breakfast PC Lunch PC Dinner  Glucose range:     Averages:  184 182   Previously:  CGM use % of time   2-week average/GV 149/31  Time in range 77       %  % Time Above 180 19  % Time above 250 3  % Time Below 70 1     PRE-MEAL  Fasting Lunch Dinner Bedtime Overall  Glucose range:       Averages: 154 148 188 209/162    POST-MEAL PC Breakfast PC Lunch PC Dinner  Glucose range:     Averages: 135 144 166     Wt Readings from Last 3 Encounters:  02/07/22 198 lb (89.8 kg)  11/22/21 198 lb (89.8 kg)  11/08/21 198 lb (89.8 kg)     Lab Results  Component Value Date   HGBA1C 7.4 (H) 02/07/2022   HGBA1C 7.4 (H) 11/06/2021   HGBA1C 7.6 (H) 08/21/2021   Lab Results  Component Value Date   MICROALBUR <0.7 08/28/2021   LDLCALC 61 11/06/2021   CREATININE 1.08 02/07/2022     OTHER active problems discussed today: See review of systems    Allergies as of 02/13/2022       Reactions   Lisinopril Cough   Ivp Dye [iodinated Contrast Media] Hives, Itching        Medication List        Accurate as of February 13, 2022  8:10 AM. If you have any questions, ask your nurse or doctor.          baclofen 10 MG tablet Commonly known as: LIORESAL TAKE 1 TO 2 TABLETS THREE TIMES A DAY (UP TO 6 PILLS/DAY)   Contour Next Test test strip Generic drug: glucose blood USE 2 TIMES PER DAY. E10.65   dalfampridine 10 MG Tb12 Commonly known as: Ampyra Take 1 tablet (10 mg total) by mouth 2 (two) times daily.   Dexcom G5 Receiver Kit Devi by Does not apply route.   Dexcom G6 Sensor Misc Use as directed.   Dexcom G6 Transmitter Misc Use to monitor blood sugar   Gvoke HypoPen 2-Pack 1 MG/0.2ML Soaj Generic drug: Glucagon Inject 1 Act into the skin daily as needed.   losartan 50 MG tablet Commonly known as: COZAAR TAKE 1 TABLET BY MOUTH EVERY DAY   NovoLOG 100 UNIT/ML injection Generic drug: insulin aspart USE UP TO 80 UNITS IN INSULIN PUMP DAILY-DX CODE E10.8   OVER THE COUNTER MEDICATION Sea moss supplement-vitamin and minerals daily   silodosin 8 MG Caps capsule Commonly known as: RAPAFLO Take 1 capsule (8 mg total) by mouth daily with breakfast.   simvastatin 20 MG tablet Commonly known  as: ZOCOR TAKE 1 TABLET BY MOUTH EVERYDAY AT BEDTIME   solifenacin 5 MG tablet Commonly known as: VESICARE TAKE 1 TABLET (5 MG TOTAL) BY MOUTH DAILY.   Synthroid 125 MCG tablet Generic drug: levothyroxine TAKE 1 TABLET BY MOUTH 6 DAYS PER WEEK        Allergies:  Allergies  Allergen Reactions   Lisinopril Cough   Ivp Dye [Iodinated Contrast Media] Hives and Itching    Past Medical History:  Diagnosis Date   Hyperlipidemia    Hypertension    Hypogonadism in male 11/01/2016   Hypothyroidism    Multiple sclerosis ([place])    Neuromuscular disorder ([place])    Proliferative diabetic retinopathy(362.02)    Type 1 diabetes mellitus ([place]) dx'd 1994   Vitamin D insufficiency 11/29/2016    Past Surgical History:  Procedure Laterality Date   EYE SURGERY Bilateral    "laser for diabetic retinopathy"   FRACTURE SURGERY     IR VENO/EXT/UNI LEFT  03/24/2020   OPEN REDUCTION INTERNAL FIXATION (ORIF) TIBIA/FIBULA FRACTURE Right 10/30/2016   ORIF ANKLE FRACTURE Left 2015   ORIF TIBIA FRACTURE Right 10/30/2016   Procedure: OPEN REDUCTION INTERNAL FIXATION (ORIF) TIBIA FIBULA FRACTURE;  Surgeon: Altamese Marston, MD;  Location: [facility];  Service: Orthopedics;  Laterality: Right;   RETINAL LASER PROCEDURE Bilateral    "for diabetic retinopathy"    Family History  Problem Relation Age of Onset   Diabetes Sister    Colon polyps Maternal Uncle    Colon cancer Maternal Grandmother    Stomach cancer Cousin    Esophageal cancer Neg Hx     Social History:  reports that he has never smoked. He has never used smokeless tobacco. He reports that he does not drink alcohol and does not use drugs.    Review of Systems      HYPERCHOLESTEROLEMIA: He has been taking simvastatin 20 mg daily  LDL at goal  Lab Results  Component Value Date   CHOL 112 11/06/2021   HDL 43.90 11/06/2021   LDLCALC 61 11/06/2021   LDLDIRECT 65.0 02/09/2021   TRIG 39.0 11/06/2021   CHOLHDL 3 11/06/2021   Lab  Results  Component Value Date   ALT 20 11/06/2021    HYPERTENSION: He had been on lisinopril with usually good control  of blood pressure  He is on losartan 50 mg daily   BP Readings from Last 3 Encounters:  02/07/22 122/78  11/22/21 (!) 114/59  08/24/21 (!) 146/86    He has MULTIPLE sclerosis, is followed regularly by neurologist  HYPOTHYROIDISM: He has had longstanding primary 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 autoimmune  His TSH previously had been frequently variable and not clear why it fluctuates, more recently been requiring lower doses He also does not feel any different whether his thyroid level is high or low He generally has a tendency of feeling sleepy during the day  His dose has been 125 mcg, 6 1/2 days a week  He is getting brand-name Synthroid consistently and is using 90-day prescriptions  He is regular with taking his supplement before breakfast daily TSH history as follows:   Lab Results  Component Value Date   TSH 2.45 11/06/2021   TSH 1.31 08/22/2021   TSH 4.14 05/19/2021   FREET4 0.95 05/19/2021   FREET4 1.06 07/12/2020   FREET4 0.75 03/14/2020        Objective:   Physical Exam  There were no vitals taken for this visit.           Assessment:      DIABETES type I on insulin pump:   See history of present illness for detailed discussion of current diabetes management, blood sugar patterns and problems identified  A1c is 7.4  Overall blood sugars are about the same He is still having postprandial hyperglycemia and his bolus appears to be not  acting well enough at the time of meals with delayed action and unclear if this is related to late boluses or slow action of NovoLog  He has just started to use the 780 pump but does not have the sensor Also likely has variability in blood sugar response to various meals Hypoglycemia has been minimal  HYPOTHYROIDISM:  TSH normal on his last visit will recheck next time Compliance is good  LIPIDS:  Well-controlled as of last visit  HYPERTENSION: Recent blood pressure with neurologist was fairly good   Plan:       He needs to change his active insulin time to 2 hours as recommended for the 780 pump Trial of Fiasp insulin instead of NovoLog but he can wait till he finishes his current supply Again make sure he boluses ahead of time for his meals Add 20 to 30% more insulin for high fat meals Call if not having consistent control He will check to see if paperwork for the guardian 4 sensor has been faxed to [place] since has not received the sensor rate  Check thyroid levels on the next visit     There are no Patient Instructions on file for this visit.   Total visit time including counseling = 30 minutes  Glorya Bartley  Note: This office note was prepared with Estate agent. Any transcriptional errors that result from this process are unintentional.

## 2022-02-15 ENCOUNTER — Encounter: Payer: Self-pay | Admitting: Endocrinology

## 2022-02-19 ENCOUNTER — Telehealth: Payer: Self-pay | Admitting: Neurology

## 2022-02-19 NOTE — Telephone Encounter (Signed)
Pt is asking for a call to discuss the MRI order being sent to wrong facility, please call.

## 2022-02-20 NOTE — Telephone Encounter (Signed)
There is a note from GI that he wants to go to [facility] on [address]. I will change the location on the auth, get the order signed by Dr. Felecia Shelling and fax it then they will call him to schedule.

## 2022-02-22 ENCOUNTER — Telehealth: Payer: Self-pay | Admitting: Endocrinology

## 2022-02-22 NOTE — Telephone Encounter (Signed)
Sent to [facility] [phone] for open mri

## 2022-02-22 NOTE — Telephone Encounter (Signed)
Patient is calling to say that he has not gotten the sensor(s) for the new pump that he has gotten (NOT the Dexcom G6, but the new pump) and wants to know what he needs to do.

## 2022-02-26 NOTE — Telephone Encounter (Signed)
error 

## 2022-02-27 ENCOUNTER — Ambulatory Visit: Payer: Medicare PPO

## 2022-02-28 DIAGNOSIS — E1065 Type 1 diabetes mellitus with hyperglycemia: Secondary | ICD-10-CM | POA: Diagnosis not present

## 2022-03-02 ENCOUNTER — Other Ambulatory Visit: Payer: Self-pay

## 2022-03-02 ENCOUNTER — Emergency Department (HOSPITAL_COMMUNITY)
Admission: EM | Admit: 2022-03-02 | Discharge: 2022-03-03 | Discharge status: Against Medical Advice | Payer: Medicare PPO | Attending: Emergency Medicine | Admitting: Emergency Medicine

## 2022-03-02 ENCOUNTER — Encounter (HOSPITAL_COMMUNITY): Payer: Self-pay | Admitting: Emergency Medicine

## 2022-03-02 ENCOUNTER — Emergency Department (HOSPITAL_COMMUNITY): Payer: Medicare PPO

## 2022-03-02 ENCOUNTER — Emergency Department (HOSPITAL_BASED_OUTPATIENT_CLINIC_OR_DEPARTMENT_OTHER): Payer: Medicare PPO

## 2022-03-02 ENCOUNTER — Other Ambulatory Visit: Payer: Self-pay | Admitting: Internal Medicine

## 2022-03-02 DIAGNOSIS — R079 Chest pain, unspecified: Secondary | ICD-10-CM | POA: Diagnosis not present

## 2022-03-02 DIAGNOSIS — Z86718 Personal history of other venous thrombosis and embolism: Secondary | ICD-10-CM | POA: Diagnosis not present

## 2022-03-02 DIAGNOSIS — M79601 Pain in right arm: Secondary | ICD-10-CM | POA: Diagnosis not present

## 2022-03-02 DIAGNOSIS — R0789 Other chest pain: Secondary | ICD-10-CM | POA: Diagnosis not present

## 2022-03-02 DIAGNOSIS — N3281 Overactive bladder: Secondary | ICD-10-CM

## 2022-03-02 DIAGNOSIS — E119 Type 2 diabetes mellitus without complications: Secondary | ICD-10-CM | POA: Insufficient documentation

## 2022-03-02 DIAGNOSIS — Z5321 Procedure and treatment not carried out due to patient leaving prior to being seen by health care provider: Secondary | ICD-10-CM | POA: Insufficient documentation

## 2022-03-02 DIAGNOSIS — R202 Paresthesia of skin: Secondary | ICD-10-CM | POA: Diagnosis not present

## 2022-03-02 LAB — BASIC METABOLIC PANEL
Anion gap: 9 (ref 5–15)
BUN: 11 mg/dL (ref 6–20)
CO2: 25 mmol/L (ref 22–32)
Calcium: 9.1 mg/dL (ref 8.9–10.3)
Chloride: 104 mmol/L (ref 98–111)
Creatinine, Ser: 1.13 mg/dL (ref 0.61–1.24)
GFR, Estimated: >60 mL/min (ref >60)
Glucose, Bld: 165 mg/dL — ABNORMAL HIGH (ref 70–99)
Potassium: 4.8 mmol/L (ref 3.5–5.1)
Sodium: 138 mmol/L (ref 135–145)

## 2022-03-02 LAB — TROPONIN I (HIGH SENSITIVITY): Troponin I (High Sensitivity): 8 ng/L (ref <18)

## 2022-03-02 LAB — CBC
HCT: 44.3 % (ref 39.0–52.0)
Hemoglobin: 14.9 g/dL (ref 13.0–17.0)
MCH: 29.7 pg (ref 26.0–34.0)
MCHC: 33.6 g/dL (ref 30.0–36.0)
MCV: 88.2 fL (ref 80.0–100.0)
Platelets: 173 10*3/uL (ref 150–400)
RBC: 5.02 MIL/uL (ref 4.22–5.81)
RDW: 13.2 % (ref 11.5–15.5)
WBC: 7 10*3/uL (ref 4.0–10.5)
nRBC: 0 % (ref 0.0–0.2)

## 2022-03-02 LAB — D-DIMER, QUANTITATIVE: D-Dimer, Quant: <0.27 ug/mL-FEU (ref 0.00–0.50)

## 2022-03-02 MED ORDER — ASPIRIN 81 MG PO CHEW
324.0000 mg | CHEWABLE_TABLET | Freq: Once | ORAL | Status: DC
  Filled 2022-03-02: qty 4

## 2022-03-02 NOTE — ED Provider Triage Note (Signed)
Emergency Medicine Provider Triage Evaluation Note  Johnathan Ross , a 50 y.o. male  was evaluated in triage.  Pt complains of right upper extremity tingling and discomfort associated with chest pain.  Patient has history of prior DVT.  He is no longer on anticoagulation.  Patient started having discomfort in his right arm that concerns him for recurrent DVT.  He has not noticed a lot of swelling.  He also started having some intermittent chest discomfort.  No fevers or shortness of breath.  Patient does have history of diabetes  Review of Systems  Positive: Arm discomfort, chest discomfort Negative: No fever  Physical Exam  BP (!) 143/83   Pulse 92   Temp 99.2 F (37.3 C) (Oral)   Resp 14   Ht 1.854 m ('6\' 1"'$ )   Wt 89.8 kg   SpO2 100%   BMI 26.12 kg/m  Gen:   Awake, no distress   Resp:  Normal effort  MSK:   Moves extremities without difficulty no significant swelling noted Other:    Medical Decision Making  Medically screening exam initiated at 6:52 PM.  Appropriate orders placed.  Rachelle Hora was informed that the remainder of the evaluation will be completed by another provider, this initial triage assessment does not replace that evaluation, and the importance of remaining in the ED until their evaluation is complete.  We will proceed with evaluation for possible PE DVT.  Patient does have a contrast allergy so we will start with a Doppler study of his upper extremity and a D-dimer.   Dorie Rank, MD 03/02/22 [phone]

## 2022-03-02 NOTE — ED Triage Notes (Signed)
Patient arrives by POV c/o right arm pain and sensitivity over the past week. Patient reports hx of blood clots in left arm. Over the week started having lower chest pain.

## 2022-03-02 NOTE — Progress Notes (Signed)
Upper extremity venous right study completed.  Preliminary results relayed to Tomi Bamberger, MD.  See CV Proc for preliminary results report.   Darlin Coco, RDMS, RVT

## 2022-03-03 NOTE — ED Notes (Signed)
Pt's name called multiple times, pt not in lobby

## 2022-03-04 ENCOUNTER — Emergency Department (HOSPITAL_COMMUNITY)
Admission: EM | Admit: 2022-03-04 | Discharge: 2022-03-04 | Disposition: A | Discharge status: Home / Self Care | Payer: Medicare PPO | Attending: Emergency Medicine | Admitting: Emergency Medicine

## 2022-03-04 ENCOUNTER — Encounter (HOSPITAL_COMMUNITY): Payer: Self-pay

## 2022-03-04 ENCOUNTER — Other Ambulatory Visit: Payer: Self-pay

## 2022-03-04 DIAGNOSIS — R21 Rash and other nonspecific skin eruption: Secondary | ICD-10-CM | POA: Diagnosis not present

## 2022-03-04 DIAGNOSIS — B029 Zoster without complications: Secondary | ICD-10-CM | POA: Diagnosis not present

## 2022-03-04 DIAGNOSIS — Z794 Long term (current) use of insulin: Secondary | ICD-10-CM | POA: Insufficient documentation

## 2022-03-04 DIAGNOSIS — B084 Enteroviral vesicular stomatitis with exanthem: Secondary | ICD-10-CM | POA: Diagnosis not present

## 2022-03-04 DIAGNOSIS — E119 Type 2 diabetes mellitus without complications: Secondary | ICD-10-CM | POA: Insufficient documentation

## 2022-03-04 DIAGNOSIS — I10 Essential (primary) hypertension: Secondary | ICD-10-CM | POA: Insufficient documentation

## 2022-03-04 MED ORDER — VALACYCLOVIR HCL 1 G PO TABS: 1000.0000 mg | ORAL_TABLET | Freq: Three times a day (TID) | ORAL | 0 refills | Status: AC

## 2022-03-04 MED ORDER — VALACYCLOVIR HCL 500 MG PO TABS
1000.0000 mg | ORAL_TABLET | Freq: Once | ORAL | Status: AC
  Administered 2022-03-04: 1000 mg via ORAL
  Filled 2022-03-04: qty 2

## 2022-03-04 NOTE — ED Provider Notes (Signed)
[facility] EMERGENCY DEPARTMENT Provider Note   CSN: [id number] Arrival date & time: 03/04/22  8921     History  No chief complaint on file.   Johnathan Ross is a 51 y.o. male.  Patient is a 51 year old male with a past medical history of DVT no longer on anticoagulation and MS not currently on any home medications, DM and HTN presenting to the emergency department with pain and rash to his right upper extremity.  The patient states that 2 days ago he started to feel sensitivity discomfort to his right arm.  He denies any swelling.  He states his previous DVT was provoked from using crutches after a leg injury.  He denies any recent hospitalizations or surgeries or recent long travels in the car or plane.  He states that he was seen in the waiting room 2 nights ago and had labs and an ultrasound performed of his arm but left prior to being seen.  He states that since then he noticed a rash pop up on his arm and upper back.  He states it is not itchy.  He denies any fevers or chills.  He denies any known sick contacts or anyone with a similar rash.  He denies any recent changes to detergents, soaps and denies any recent travels or camping trips.  He states he did have chickenpox as a kid.  He states he checked his blood sugar this morning and it was 160.  The history is provided by the patient.       Home Medications Prior to Admission medications   Medication Sig Start Date End Date Taking? Authorizing Provider  valACYclovir (VALTREX) 1000 MG tablet Take 1 tablet (1,000 mg total) by mouth 3 (three) times daily for 7 days. 03/04/22 03/11/22 Yes Kingsley, [place] K, DO  baclofen (LIORESAL) 10 MG tablet TAKE 1 TO 2 TABLETS THREE TIMES A DAY (UP TO 6 PILLS/DAY) 02/02/22   Sater, Nanine Means, MD  Continuous Blood Gluc Receiver ([place] G5 RECEIVER KIT) DEVI by Does not apply route.    [provider]  Continuous Blood Gluc Sensor (DEXCOM G6 SENSOR) MISC Use as  directed. 04/11/20   Elayne Snare, MD  Continuous Blood Gluc Transmit (DEXCOM G6 TRANSMITTER) MISC Use to monitor blood sugar 04/11/20   Elayne Snare, MD  dalfampridine (AMPYRA) 10 MG TB12 Take 1 tablet (10 mg total) by mouth 2 (two) times daily. 12/22/20   Sater, Nanine Means, MD  Glucagon (GVOKE HYPOPEN 2-PACK) 1 MG/0.2ML SOAJ Inject 1 Act into the skin daily as needed. 09/21/20   Janith Lima, MD  glucose blood (CONTOUR NEXT TEST) test strip USE 2 TIMES PER DAY. E10.65 08/22/21   Elayne Snare, MD  losartan (COZAAR) 50 MG tablet TAKE 1 TABLET BY MOUTH EVERY DAY 07/07/21   Elayne Snare, MD  NOVOLOG 100 UNIT/ML injection USE UP TO 80 UNITS IN INSULIN PUMP DAILY-DX CODE E10.8 01/24/22   Elayne Snare, MD  OVER THE COUNTER MEDICATION Sea moss supplement-vitamin and minerals daily    [provider]  silodosin (RAPAFLO) 8 MG CAPS capsule Take 1 capsule (8 mg total) by mouth daily with breakfast. 02/07/22   Sater, Nanine Means, MD  simvastatin (ZOCOR) 20 MG tablet TAKE 1 TABLET BY MOUTH EVERYDAY AT BEDTIME 11/20/21   Elayne Snare, MD  solifenacin (VESICARE) 5 MG tablet TAKE 1 TABLET (5 MG TOTAL) BY MOUTH DAILY. 03/02/22   Janith Lima, MD  SYNTHROID 125 MCG tablet TAKE 1 TABLET  BY MOUTH 6 DAYS PER WEEK 12/06/21   Elayne Snare, MD      Allergies    Lisinopril and Ivp dye [iodinated contrast media]    Review of Systems   Review of Systems  Physical Exam Updated Vital Signs BP 135/83   Pulse 87   Temp (!) 97.5 F (36.4 C) (Oral)   Resp 15   Ht '6\' 1"'  (1.854 m)   Wt 89 kg   SpO2 100%   BMI 25.89 kg/m  Physical Exam Vitals and nursing note reviewed.  Constitutional:      General: He is not in acute distress.    Appearance: Normal appearance.  HENT:     Head: Normocephalic and atraumatic.     Nose: Nose normal.     Mouth/Throat:     Mouth: Mucous membranes are moist.     Pharynx: Oropharynx is clear.  Eyes:     Extraocular Movements: Extraocular movements intact.     Conjunctiva/sclera:  Conjunctivae normal.  Cardiovascular:     Rate and Rhythm: Normal rate and regular rhythm.     Pulses: Normal pulses.     Heart sounds: Normal heart sounds.  Pulmonary:     Effort: Pulmonary effort is normal.     Breath sounds: Normal breath sounds.  Abdominal:     General: Abdomen is flat.     Palpations: Abdomen is soft.     Tenderness: There is no abdominal tenderness.  Musculoskeletal:        General: Normal range of motion.     Cervical back: Normal range of motion and neck supple.  Skin:    General: Skin is warm and dry.     Comments: Pustular rash with erythematous base along right axilla, right chest and right upper back, does not cross midline  Neurological:     Mental Status: He is alert and oriented to person, place, and time. Mental status is at baseline.  Psychiatric:        Mood and Affect: Mood normal.        Behavior: Behavior normal.        ED Results / Procedures / Treatments   Labs (all labs ordered are listed, but only abnormal results are displayed) Labs Reviewed - No data to display  EKG None  Radiology UE Venous Duplex ([facility] and [facility] ONLY)  Result Date: 03/03/2022 UPPER VENOUS STUDY  Patient Name:  MICHA DOSANJH  Date of Exam:   03/02/2022 Medical Rec #: [id number]         Accession #:    [id number] Date of Birth: 20-Jan-1971         Patient Gender: M Patient Age:   72 years Exam Location:  [facility] Procedure:      VAS [place] UPPER EXTREMITY VENOUS DUPLEX Referring Phys: Wille Glaser KNAPP --------------------------------------------------------------------------------  Indications: Right arm pain, history of DVT left arm Comparison Study: No prior right upper extremity studies. Multiple left upper                   extremity studies. Performing Technologist: Darlin Coco RDMS, RVT  Examination Guidelines: A complete evaluation includes B-mode imaging, spectral Doppler, color Doppler, and power Doppler as needed of all accessible portions of each vessel.  Bilateral testing is considered an integral part of a complete examination. Limited examinations for reoccurring indications may be performed as noted.  Right Findings: +----------+------------+---------+-----------+----------+-------+ RIGHT     CompressiblePhasicitySpontaneousPropertiesSummary +----------+------------+---------+-----------+----------+-------+ IJV  Full       Yes       Yes                      +----------+------------+---------+-----------+----------+-------+ Subclavian               Yes       Yes                      +----------+------------+---------+-----------+----------+-------+ Axillary      Full       Yes       Yes                      +----------+------------+---------+-----------+----------+-------+ Brachial      Full                                          +----------+------------+---------+-----------+----------+-------+ Radial        Full                                          +----------+------------+---------+-----------+----------+-------+ Ulnar         Full                                          +----------+------------+---------+-----------+----------+-------+ Cephalic      Full                                          +----------+------------+---------+-----------+----------+-------+ Basilic       Full                                          +----------+------------+---------+-----------+----------+-------+  Left Findings: +----------+------------+---------+-----------+----------+-------+ LEFT      CompressiblePhasicitySpontaneousPropertiesSummary +----------+------------+---------+-----------+----------+-------+ Subclavian               Yes       Yes                      +----------+------------+---------+-----------+----------+-------+  Summary:  Right: No evidence of deep vein thrombosis in the upper extremity. No evidence of superficial vein thrombosis in the upper extremity.  Left: No  evidence of thrombosis in the subclavian.  *See table(s) above for measurements and observations.  Diagnosing physician: Orlie Pollen Electronically signed by Orlie Pollen on 03/03/2022 at 11:23:02 AM.    Final    DG Chest 2 View  Result Date: 03/02/2022 CLINICAL DATA:  Chest pain and left arm pain for 1 week EXAM: CHEST - 2 VIEW COMPARISON:  01/31/2021 FINDINGS: The heart size and mediastinal contours are within normal limits. Both lungs are clear. The visualized skeletal structures are unremarkable. IMPRESSION: No active cardiopulmonary disease. Electronically Signed   By: Lucienne Capers M.D.   On: 03/02/2022 19:44    Procedures Procedures    Medications Ordered in ED Medications  valACYclovir (VALTREX) tablet 1,000 mg (has no administration in time range)    ED Course/ Medical Decision Making/ A&P  Medical Decision Making This patient presents to the ED with chief complaint(s) of rash and arm pain with pertinent past medical history of DVT no longer on anticoagulation, MS, diabetes, hypertension which further complicates the presenting complaint. The complaint involves an extensive differential diagnosis and also carries with it a high risk of complications and morbidity.    The differential diagnosis includes patient has a pustular appearing rash in the T1 dermatome, consistent with shingles rash.  I reviewed his work-up performed in triage 2 days ago and right upper extremity ultrasound showed no evidence of DVT, his labs are within normal range without any evidence of DKA or complications from his shingles.  He has no new focal neurologic deficits, no surrounding erythema, warmth making cellulitis unlikely, no palpable fluctuance making abscess unlikely  Additional history obtained: Additional history obtained from family Records reviewed neurology records, ED triage records from 10/6  ED Course and Reassessment: Patient has a pustular appearing rash with  erythematous base along the T1 distribution on his right side, consistent with shingles.  I reviewed his labs and DVT study performed on 10/6 that were within normal range.  His pain is well controlled at this time.  He will be treated with antivirals and recommended pain control with primary care follow-up.  Independent labs interpretation:  The following labs were independently interpreted: Reviewed labs from 10/6, within normal range  Independent visualization of imaging: - I independently visualized the following imaging with scope of interpretation limited to determining acute life threatening conditions related to emergency care: Right upper extremity DVT study on 10/6, which revealed no DVT  Consultation: - Consulted or discussed management/test interpretation w/ external professional: N/A  Consideration for admission or further workup: Patient has no emergent conditions requiring admission at this time and is stable for discharge with primary care follow-up Social Determinants of health: N/A            Final Clinical Impression(s) / ED Diagnoses Final diagnoses:  Herpes zoster without complication    Rx / DC Orders ED Discharge Orders          Ordered    valACYclovir (VALTREX) 1000 MG tablet  3 times daily        03/04/22 1303              Leanord Asal K, DO 03/04/22 1305

## 2022-03-04 NOTE — Discharge Instructions (Signed)
You were seen in the emergency department for your rash.  This rash is consistent with shingles which can happen when you have had chickenpox as a kid.  I have given you an antiviral medication to treat the shingles.  It is common to have nerve pain with the rash and you can take Tylenol and Motrin as needed for pain.  The pain may get more severe or may persist after the rash has resolved so it is important that you follow-up with your primary doctor to have your symptoms rechecked to determine if you need any further treatments for pain control and to ensure that the rash is healing appropriately.  You should return to the emergency department if you notice streaking redness from your rash, you have fevers, you have repetitive vomiting, or if you have any other new or concerning symptoms.

## 2022-03-04 NOTE — ED Triage Notes (Addendum)
Patient with rash that appear pustules and red in inner upper right arm crossing to chest. Denis burning but complains of itching. Patient has hx of DVT and was here this past Friday for that. Patient had DVT study of this arm on Friday and was neg

## 2022-03-05 ENCOUNTER — Ambulatory Visit: Payer: Medicare PPO | Admitting: Physical Therapy

## 2022-03-09 ENCOUNTER — Telehealth: Payer: Self-pay

## 2022-03-09 NOTE — Telephone Encounter (Signed)
        Patient  visited [facility] on 10/8   Telephone encounter attempt :  1st  A HIPAA compliant voice message was left requesting a return call.  Instructed patient to call back     [facility]  [phone] [address], [place], [place] [address] Phone: [phone] Email: [email or web address]

## 2022-03-12 ENCOUNTER — Encounter: Payer: Self-pay | Admitting: Internal Medicine

## 2022-03-12 ENCOUNTER — Ambulatory Visit (INDEPENDENT_AMBULATORY_CARE_PROVIDER_SITE_OTHER): Payer: Medicare PPO | Admitting: Internal Medicine

## 2022-03-12 VITALS — BP 118/80 | HR 93 | Temp 98.0°F

## 2022-03-12 DIAGNOSIS — B0229 Other postherpetic nervous system involvement: Secondary | ICD-10-CM | POA: Diagnosis not present

## 2022-03-12 DIAGNOSIS — I10 Essential (primary) hypertension: Secondary | ICD-10-CM

## 2022-03-12 DIAGNOSIS — I82A12 Acute embolism and thrombosis of left axillary vein: Secondary | ICD-10-CM | POA: Insufficient documentation

## 2022-03-12 DIAGNOSIS — E559 Vitamin D deficiency, unspecified: Secondary | ICD-10-CM

## 2022-03-12 MED ORDER — GABAPENTIN 100 MG PO CAPS: 100.0000 mg | ORAL_CAPSULE | Freq: Three times a day (TID) | ORAL | 5 refills | Status: DC

## 2022-03-12 NOTE — Assessment & Plan Note (Signed)
Uncontrolled, pain, now for gabapentin 100 mg tid,  to f/u any worsening symptoms or concerns

## 2022-03-12 NOTE — Assessment & Plan Note (Signed)
BP Readings from Last 3 Encounters:  03/12/22 118/80  03/04/22 137/89  03/02/22 (!) 144/89   Stable, pt to continue medical treatment losartan 50 mg qd

## 2022-03-12 NOTE — Progress Notes (Signed)
Patient ID: Johnathan Ross, male   DOB: August 06, 1970, 51 y.o.   MRN: [id number]        Chief Complaint: follow up post ED visit oct 6 with shingles outbreak       HPI:  Johnathan Ross is a 51 y.o. male here with c/o persistent pain to the right upper back, side and upper chest now with scabbed over lesions, finished antiviral med tx.  Pain still about 7/10.  Pt denies other chest pain, increased sob or doe, wheezing, orthopnea, PND, increased LE swelling, palpitations, dizziness or syncope.  Pt denies polydipsia, polyuria, or new focal neuro s/s.    Pt denies fever, wt loss, night sweats, loss of appetite, or other constitutional symptoms    Wt Readings from Last 3 Encounters:  03/04/22 196 lb 3.4 oz (89 kg)  03/02/22 198 lb (89.8 kg)  02/07/22 198 lb (89.8 kg)   BP Readings from Last 3 Encounters:  03/12/22 118/80  03/04/22 137/89  03/02/22 (!) 144/89         Past Medical History:  Diagnosis Date   Hyperlipidemia    Hypertension    Hypogonadism in male 11/01/2016   Hypothyroidism    Multiple sclerosis ([place])    Neuromuscular disorder ([place])    Proliferative diabetic retinopathy(362.02)    Type 1 diabetes mellitus ([place]) dx'd 1994   Vitamin D insufficiency 11/29/2016   Past Surgical History:  Procedure Laterality Date   EYE SURGERY Bilateral    "laser for diabetic retinopathy"   FRACTURE SURGERY     IR VENO/EXT/UNI LEFT  03/24/2020   OPEN REDUCTION INTERNAL FIXATION (ORIF) TIBIA/FIBULA FRACTURE Right 10/30/2016   ORIF ANKLE FRACTURE Left 2015   ORIF TIBIA FRACTURE Right 10/30/2016   Procedure: OPEN REDUCTION INTERNAL FIXATION (ORIF) TIBIA FIBULA FRACTURE;  Surgeon: Altamese [place], MD;  Location: [facility];  Service: Orthopedics;  Laterality: Right;   RETINAL LASER PROCEDURE Bilateral    "for diabetic retinopathy"    reports that he has never smoked. He has never used smokeless tobacco. He reports that he does not drink alcohol and does not use drugs. family history includes Colon  cancer in his maternal grandmother; Colon polyps in his maternal uncle; Diabetes in his sister; Stomach cancer in his cousin. Allergies  Allergen Reactions   Lisinopril Cough   Ivp Dye [Iodinated Contrast Media] Hives and Itching   Current Outpatient Medications on File Prior to Visit  Medication Sig Dispense Refill   baclofen (LIORESAL) 10 MG tablet TAKE 1 TO 2 TABLETS THREE TIMES A DAY (UP TO 6 PILLS/DAY) 540 tablet 0   Continuous Blood Gluc Receiver (DEXCOM G5 RECEIVER KIT) DEVI by Does not apply route.     Continuous Blood Gluc Sensor (DEXCOM G6 SENSOR) MISC Use as directed. 12 each 1   Continuous Blood Gluc Transmit (DEXCOM G6 TRANSMITTER) MISC Use to monitor blood sugar 1 each 0   dalfampridine (AMPYRA) 10 MG TB12 Take 1 tablet (10 mg total) by mouth 2 (two) times daily. 180 tablet 3   Glucagon (GVOKE HYPOPEN 2-PACK) 1 MG/0.2ML SOAJ Inject 1 Act into the skin daily as needed. 2 mL 5   glucose blood (CONTOUR NEXT TEST) test strip USE 2 TIMES PER DAY. E10.65 200 strip 3   losartan (COZAAR) 50 MG tablet TAKE 1 TABLET BY MOUTH EVERY DAY 90 tablet 3   NOVOLOG 100 UNIT/ML injection USE UP TO 80 UNITS IN INSULIN PUMP DAILY-DX CODE E10.8 70 mL 0   OVER THE COUNTER MEDICATION  Sea moss supplement-vitamin and minerals daily     silodosin (RAPAFLO) 8 MG CAPS capsule Take 1 capsule (8 mg total) by mouth daily with breakfast. 30 capsule 11   simvastatin (ZOCOR) 20 MG tablet TAKE 1 TABLET BY MOUTH EVERYDAY AT BEDTIME 90 tablet 3   solifenacin (VESICARE) 5 MG tablet TAKE 1 TABLET (5 MG TOTAL) BY MOUTH DAILY. 90 tablet 0   SYNTHROID 125 MCG tablet TAKE 1 TABLET BY MOUTH 6 DAYS PER WEEK 72 tablet 1   No current facility-administered medications on file prior to visit.        ROS:  All others reviewed and negative.  Objective        PE:  BP 118/80 (BP Location: Right Arm, Patient Position: Sitting, Cuff Size: Normal)   Pulse 93   Temp 98 F (36.7 C) (Oral)                 Constitutional: Pt  appears in NAD               HENT: Head: NCAT.                Right Ear: External ear normal.                 Left Ear: External ear normal.                Eyes: . Pupils are equal, round, and reactive to light. Conjunctivae and EOM are normal               Nose: without d/c or deformity               Neck: Neck supple. Gross normal ROM               Cardiovascular: Normal rate and regular rhythm.                 Pulmonary/Chest: Effort normal and breath sounds without rales or wheezing.                Abd:  Soft, NT, ND, + BS, no organomegaly               Neurological: Pt is alert. At baseline orientation, motor grossly intact               Skin: LE edema - none, has rash right upper back now multiple lesions scabbed over               Psychiatric: Pt behavior is normal without agitation   Micro: none  Cardiac tracings I have personally interpreted today:  none  Pertinent Radiological findings (summarize): none   Lab Results  Component Value Date   WBC 7.0 03/02/2022   HGB 14.9 03/02/2022   HCT 44.3 03/02/2022   PLT 173 03/02/2022   GLUCOSE 165 (H) 03/02/2022   CHOL 112 11/06/2021   TRIG 39.0 11/06/2021   HDL 43.90 11/06/2021   LDLDIRECT 65.0 02/09/2021   LDLCALC 61 11/06/2021   ALT 20 11/06/2021   AST 20 11/06/2021   NA 138 03/02/2022   K 4.8 03/02/2022   CL 104 03/02/2022   CREATININE 1.13 03/02/2022   BUN 11 03/02/2022   CO2 25 03/02/2022   TSH 2.45 11/06/2021   PSA 1.03 03/01/2020   HGBA1C 7.4 (H) 02/07/2022   MICROALBUR <0.7 08/28/2021   Assessment/Plan:  Johnathan Ross is a 51 y.o. Black or African American [2] male with  has a past  medical history of Hyperlipidemia, Hypertension, Hypogonadism in male (11/01/2016), Hypothyroidism, Multiple sclerosis ([place]), Neuromuscular disorder ([place]), Proliferative diabetic retinopathy(362.02), Type 1 diabetes mellitus ([place]) (dx'd 1994), and Vitamin D insufficiency (11/29/2016).  PHN (postherpetic neuralgia) Uncontrolled,  pain, now for gabapentin 100 mg tid,  to f/u any worsening symptoms or concerns  Hypertension BP Readings from Last 3 Encounters:  03/12/22 118/80  03/04/22 137/89  03/02/22 (!) 144/89   Stable, pt to continue medical treatment losartan 50 mg qd   Vitamin D insufficiency Last vitamin D Lab Results  Component Value Date   VD25OH 34.4 03/23/2020   Low, reminded to take oral replacement  Followup: Return in about 3 months (around 06/12/2022).  Cathlean Cower, MD 03/12/2022 8:59 PM [facility]

## 2022-03-12 NOTE — Assessment & Plan Note (Signed)
Last vitamin D Lab Results  Component Value Date   VD25OH 34.4 03/23/2020   Low, reminded to take oral replacement

## 2022-03-12 NOTE — Patient Instructions (Signed)
Please take all new medication as prescribed - the gabapentin 100 mg three times per day  Please continue all other medications as before, and refills have been done if requested.  Please have the pharmacy call with any other refills you may need.  Please continue your efforts at being more active, low cholesterol diet, and weight control.  Please keep your appointments with your specialists as you may have planned  Please see Dr Ronnald Ramp in 3 months

## 2022-03-14 DIAGNOSIS — G35 Multiple sclerosis: Secondary | ICD-10-CM | POA: Diagnosis not present

## 2022-03-15 ENCOUNTER — Telehealth: Payer: Self-pay | Admitting: Neurology

## 2022-03-15 NOTE — Telephone Encounter (Signed)
Received the report/impression from the MRI cervical spine from [facility] that was completed. Both the impression for the MRI head and neck are in care everywhere. Will send this information to Dr Felecia Shelling for him to review and result for the pt.

## 2022-03-19 ENCOUNTER — Ambulatory Visit: Payer: Medicare PPO | Attending: Neurology | Admitting: Physical Therapy

## 2022-03-19 ENCOUNTER — Encounter: Payer: Self-pay | Admitting: Neurology

## 2022-03-19 ENCOUNTER — Encounter: Payer: Self-pay | Admitting: Physical Therapy

## 2022-03-19 DIAGNOSIS — R2681 Unsteadiness on feet: Secondary | ICD-10-CM | POA: Diagnosis not present

## 2022-03-19 DIAGNOSIS — G35 Multiple sclerosis: Secondary | ICD-10-CM | POA: Diagnosis not present

## 2022-03-19 DIAGNOSIS — R2689 Other abnormalities of gait and mobility: Secondary | ICD-10-CM | POA: Diagnosis not present

## 2022-03-19 DIAGNOSIS — R269 Unspecified abnormalities of gait and mobility: Secondary | ICD-10-CM | POA: Insufficient documentation

## 2022-03-19 DIAGNOSIS — R29818 Other symptoms and signs involving the nervous system: Secondary | ICD-10-CM | POA: Insufficient documentation

## 2022-03-19 DIAGNOSIS — M6281 Muscle weakness (generalized): Secondary | ICD-10-CM | POA: Insufficient documentation

## 2022-03-19 NOTE — Therapy (Signed)
OUTPATIENT PHYSICAL THERAPY NEURO EVALUATION   Patient Name: Johnathan Ross MRN: [id number] DOB:Aug 01, 1970, 51 y.o., male Today's Date: 03/19/2022   PCP: Janith Lima, MD REFERRING PROVIDER: Britt Bottom, MD    PT End of Session - 03/19/22 1853     Visit Number 1    Number of Visits 17    Date for PT Re-Evaluation 05/18/22    Authorization Type [employer]    Authorization Time Period 03-19-22 - 05-27-22    PT Start Time 0935    PT Stop Time 6294    PT Time Calculation (min) 48 min    Activity Tolerance Patient tolerated treatment well    Behavior During Therapy [facility] for tasks assessed/performed             Past Medical History:  Diagnosis Date   Hyperlipidemia    Hypertension    Hypogonadism in male 11/01/2016   Hypothyroidism    Multiple sclerosis ([place])    Neuromuscular disorder ([place])    Proliferative diabetic retinopathy(362.02)    Type 1 diabetes mellitus ([place]) dx'd 1994   Vitamin D insufficiency 11/29/2016   Past Surgical History:  Procedure Laterality Date   EYE SURGERY Bilateral    "laser for diabetic retinopathy"   FRACTURE SURGERY     IR VENO/EXT/UNI LEFT  03/24/2020   OPEN REDUCTION INTERNAL FIXATION (ORIF) TIBIA/FIBULA FRACTURE Right 10/30/2016   ORIF ANKLE FRACTURE Left 2015   ORIF TIBIA FRACTURE Right 10/30/2016   Procedure: OPEN REDUCTION INTERNAL FIXATION (ORIF) TIBIA FIBULA FRACTURE;  Surgeon: Altamese [place], MD;  Location: [facility];  Service: Orthopedics;  Laterality: Right;   RETINAL LASER PROCEDURE Bilateral    "for diabetic retinopathy"   Patient Active Problem List   Diagnosis Date Noted   PHN (postherpetic neuralgia) 03/12/2022   Acute deep vein thrombosis (DVT) of axillary vein of left upper extremity ([place]) 03/12/2022   Colon cancer screening 08/01/2021   Clinical diagnosis of COVID-19 12/28/2020   OAB (overactive bladder) 09/21/2020   Conjunctival injection, right 09/21/2020   Essential hypertension 09/21/2020   Urinary  dysfunction 09/21/2020   Thoracic outlet syndrome 05/03/2020   Controlled type 1 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 ([place]) 03/24/2020   Right epiretinal membrane 03/24/2020   Retinal exudates and deposits 03/24/2020   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   Nuclear sclerotic cataract of both eyes 03/24/2020   Gait disorder 03/23/2020   Benign prostatic hyperplasia without lower urinary tract symptoms 03/01/2020   Diabetic gastroparesis associated with type 1 diabetes mellitus ([place]) 03/01/2020   Uncontrolled type 1 diabetes mellitus with hyperglycemia ([place]) 03/01/2020   Gait disturbance 08/19/2019   Acquired diplegia ([place]) 08/19/2019   Vitamin D insufficiency 11/29/2016   Hypogonadism in male 11/01/2016   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[place])    Hypothyroidism    Hypertension    Abnormal liver function tests 03/06/2013   Type I (juvenile type) diabetes mellitus without mention of complication, uncontrolled 12/03/2007   HYPERCHOLESTEROLEMIA 11/12/2007   MULTIPLE SCLEROSIS 08/29/2007   PROLIFERATIVE DIABETIC RETINOPATHY 08/29/2007    ONSET DATE: Referral date 02-07-22  REFERRING DIAG: G35 (ICD-10-CM) - Multiple sclerosis ([place]) R26.9 (ICD-10-CM) - Gait disturbance   THERAPY DIAG:  Other abnormalities of gait and mobility  Muscle weakness (generalized)  Unsteadiness on feet  Other symptoms and signs involving the nervous system  Rationale for Evaluation and Treatment Rehabilitation  SUBJECTIVE:  SUBJECTIVE STATEMENT: Pt returns to PT after having had therapy in March - May 2023 at this facility - states she (primary PT at that time) "told me to take a break" Pt accompanied by: self  PERTINENT HISTORY: vitamin D insufficiency, Type 1  IDDM w/ polyneuropathy, diabetic retinopathy, hypothyroidism, HTN, hypercholestrolemia, h/o Rt tibia fracture, h/o Lt ankle fracture with ORIF, MS with acquired diplegia   PAIN: pain from shingles on Rt side - in posterior shoulder area  Are you having pain? Yes: NPRS scale: 6-7/10 Pain location: posterior Rt shoulder and thoracic region Pain description: burning, discomfort Aggravating factors: unknown Relieving factors: medication helps some  PRECAUTIONS: Fall  WEIGHT BEARING RESTRICTIONS: No  FALLS: Has patient fallen in last 6 months? No  LIVING ENVIRONMENT: Lives with:  lives with their partner (girlfriend) Lives in: House/apartment Stairs: Yes; External: 6 steps; on right going up, on left going up, and can reach both Has following equipment at home: Gilford Rile - 2 wheeled, Wheelchair (manual), and shower chair  PLOF: Independent with basic ADLs, Independent with household mobility with device, Independent with transfers, and Needs assistance with homemaking  PATIENT GOALS: try to increase strength in lifting each leg up from hip in seated position (hip flexors); also increase strength in lifting Lt toes off floor (dorsiflexors)   OBJECTIVE:   DIAGNOSTIC FINDINGS: MRI of the cervical spine 03/14/2022 shows T2 hyperintense foci posterolaterally to the right adjacent to C2-C3, laterally to the left adjacent to C3-C4 and anterior/central at C4-C5.  These were all present on the MRI from 07/01/2015, there were no new lesions.  Additional note is made of a disc osteophyte complex to the right at C3-C4 that could compress the C4 nerve root and a disc osteophyte complex to the left at the C4-C5 that could affect the left C5 nerve root.  There is spinal stenosis but no nerve root compression at C5-C6.  There is a left disc osteophyte complex at C6-C7 causing foraminal narrowing but no nerve root compression   MRI of the brain 03/14/2022 shows scattered T2/FLAIR hyperintense in the  periventricular, juxtacortical and deep white matter of both cerebral hemispheres.  A focus is also noted in the lateral left pons.  Paired to the MRI dated 08/20/2017, there are no new lesions.   COGNITION: Overall cognitive status: Within functional limits for tasks assessed   SENSATION: Not tested  COORDINATION: Decreased due to spasticity and weakness; pt has extensor tone/spasticity   POSTURE: No Significant postural limitations  LOWER EXTREMITY ROM:    Decreased AROM bil. LE's due to spasticity and weakness  LOWER EXTREMITY MMT:    MMT Right Eval Left Eval  Hip flexion 2- 1+  Hip extension    Hip abduction    Hip adduction    Hip internal rotation    Hip external rotation    Knee flexion    Knee extension 4 4  Ankle dorsiflexion 2+ 3-  Ankle plantarflexion    Ankle inversion    Ankle eversion    (Blank rows = not tested)  BED MOBILITY:  NT  TRANSFERS: Assistive device utilized: Environmental consultant - 2 wheeled  Sit to stand: Modified independence and UE support needed Stand to sit: Modified independence   RAMP: NT due to decr. Amb. status  CURB: Not tested due to decreased ambulation status  STAIRS:   Comments: NT due to decr. Amb. status  GAIT: Gait pattern:  pt uses vaulting gait pattern in which he lifts himself up and swings bil.LE's (paraplegic  gait pattern) for increased speed; when not in hurry attempts reciprocating gait pattern, decreased step length- Left, decreased hip/knee flexion- Right, decreased hip/knee flexion- Left, decreased ankle dorsiflexion- Left, and poor foot clearance- Left Distance walked: 64' furthest distance without rest period; 125' total distance with testing and clinic distances during eval Assistive device utilized: Environmental consultant - 2 wheeled; pt is wearing his AFO on his RLE Level of assistance: SBA Comments: see above; gait varies depending on fatigue, activity/# of people in environment; pt states he gets very anxious if he feels someone is  behind him waiting to get by or if someone is looking at him;  PERFORMANCE of GAIT VARIES depending on these factors  FUNCTIONAL TESTS:  5 times sit to stand: 16.97 secs from chair with bil. UE support to RW Timed up and go (TUG): 57.85 secs with RW 10 meter walk test: .35 ft/sec = 1"33.84 secs with RW using reciprocal gait pattern as able  PATIENT SURVEYS:  N/A - pt has MS  TODAY'S TREATMENT:                                                                                  DATE: 03-19-22 Pt was instructed in runner's stretch, gastroc stretch and hamstring stretch (says he is currently doing hamstring stretch in long sitting position on floor)   PATIENT EDUCATION: Education details: test scores in today's eval compared with those in previous admission in March 2023 Person educated: Patient Education method: Explanation Education comprehension: verbalized understanding  HOME EXERCISE PROGRAM: To be established   GOALS: Goals reviewed with patient? Yes  SHORT TERM GOALS: Target date: 04/20/2022  Improve 5x sit to stand to </= 14 secs from standard chair with bil. UE support. Baseline:  16.97 secs Goal status: INITIAL  2.  Pt will stand unsupported and reach 10" outside BOS with 1 UE support for increased independence with ADL's in standing. Baseline:  Goal status: INITIAL  3.  Improve gait speed to >/= .7 ft/sec with RW to demo improved gait efficiency. Baseline: .35 ft/sec with RW Goal status: INITIAL  4.  Improve TUG score to </= 50 secs with RW to demo increased functional mobility. Baseline: 57.85 secs with RW Goal status: INITIAL  5.  Amb. 115' with RW on flat, even surface without requiring seated rest period.  Baseline: 102' with RW Goal status: INITIAL  6.  Independent in HEP for stretching & strengthening. Baseline:  Goal status: INITIAL  LONG TERM GOALS: Target date: 05/18/2022  Improve 5x sit to stand to </= 12 secs from standard chair with bil. UE  support. Baseline:  16.97 secs Goal status: INITIAL  2.   Pt will stand unsupported and reach 6" outside BOS without UE support for increased independence with ADL's in standing. Baseline:  Goal status: INITIAL  3.  Improve gait speed to >/= 1.0 ft/sec with RW to demo improved gait efficiency. Baseline: .35 ft/sec with RW Goal status: INITIAL  4.   Improve TUG score to </= 44 secs with RW to demo increased functional mobility. Baseline: 57.85 secs with RW Goal status: INITIAL  5.  Amb. 200' with RW on flat, even surface without requiring seated  rest period.  Baseline: 26' with RW Goal status: INITIAL  6.  Independent in updated HEP for stretching & strengthening. Baseline:  Goal status: INITIAL ASSESSMENT:  CLINICAL IMPRESSION: Patient is a 51 y.o. gentleman who was seen today for physical therapy evaluation and treatment for gait abnormality and bil. LE weakness due to MS.  Pt presents with gait abnormality due to spasticity and weakness; pt has much difficulty ambulating with reciprocal gait pattern and uses paraplegic vaulting gait pattern if he is in a hurry.  Pt declines AFO for LLE at this time.  Pt is at high fall risk with TUG score 57.85 secs with RW and has significant reduced gait speed at .35 ft/sec with RW.  Pt will benefit from PT to address LE weakness, gait and balance deficits.   OBJECTIVE IMPAIRMENTS: Abnormal gait, decreased activity tolerance, decreased balance, decreased endurance, difficulty walking, decreased ROM, decreased strength, increased muscle spasms, and impaired tone.   ACTIVITY LIMITATIONS: carrying, bending, standing, squatting, stairs, transfers, and locomotion level  PARTICIPATION LIMITATIONS: meal prep, cleaning, laundry, driving, shopping, community activity, and yard work  PERSONAL FACTORS: Past/current experiences, Time since onset of injury/illness/exacerbation, and progressive nature of MS disease process  are also affecting patient's  functional outcome.   REHAB POTENTIAL: Fair due to chronicity of deficits and progressive nature of MS disease process  CLINICAL DECISION MAKING: Stable/uncomplicated  EVALUATION COMPLEXITY: Low  PLAN:  PT FREQUENCY: 2x/week  PT DURATION: 8 weeks  PLANNED INTERVENTIONS: Therapeutic exercises, Therapeutic activity, Neuromuscular re-education, Balance training, Gait training, Patient/Family education, Self Care, Stair training, Orthotic/Fit training, and DME instructions  PLAN FOR NEXT SESSION: check HEP issued on 03-19-22 (stretches - hamstring & gastroc in standing); work on hip flexor and Lt DF strengthening   [place], [place], PT 03/19/2022, 6:57 PM

## 2022-03-19 NOTE — Patient Instructions (Signed)
  Gastroc / Heel Cord Stretch - On Step    Stand with heels over edge of stair. Holding rail, lower heels until stretch is felt in calf of legs. Repeat ___ times. Do ___ times per day.  Copyright  VHI. All rights reserved.    Calf / Gastoc: Runners' Stretch I    One leg back and straight, other forward and bent supporting weight, lean forward, gently stretching calf of back leg. Hold __20-30__ seconds. Repeat with other leg. Repeat __1-2__ times. Do _2-3___ sessions per day.

## 2022-03-27 ENCOUNTER — Ambulatory Visit: Payer: Medicare PPO | Admitting: Physical Therapy

## 2022-03-29 ENCOUNTER — Encounter: Payer: Self-pay | Admitting: Physical Therapy

## 2022-03-29 ENCOUNTER — Ambulatory Visit: Payer: Medicare PPO | Attending: Neurology | Admitting: Physical Therapy

## 2022-03-29 DIAGNOSIS — M21372 Foot drop, left foot: Secondary | ICD-10-CM | POA: Diagnosis not present

## 2022-03-29 DIAGNOSIS — M21371 Foot drop, right foot: Secondary | ICD-10-CM | POA: Diagnosis not present

## 2022-03-29 DIAGNOSIS — R296 Repeated falls: Secondary | ICD-10-CM

## 2022-03-29 DIAGNOSIS — R29818 Other symptoms and signs involving the nervous system: Secondary | ICD-10-CM

## 2022-03-29 DIAGNOSIS — M6281 Muscle weakness (generalized): Secondary | ICD-10-CM

## 2022-03-29 DIAGNOSIS — R2689 Other abnormalities of gait and mobility: Secondary | ICD-10-CM | POA: Diagnosis not present

## 2022-03-29 DIAGNOSIS — R2681 Unsteadiness on feet: Secondary | ICD-10-CM | POA: Diagnosis not present

## 2022-03-29 NOTE — Therapy (Signed)
OUTPATIENT PHYSICAL THERAPY NEURO TREATMENT   Patient Name: Johnathan Ross MRN: [id number] DOB:Oct 10, 1970, 51 y.o., male Today's Date: 03/29/2022   PCP: Janith Lima, MD REFERRING PROVIDER: Britt Bottom, MD    PT End of Session - 03/29/22 0940     Visit Number 2    Number of Visits 17    Date for PT Re-Evaluation 05/18/22    Authorization Type [employer]    Authorization Time Period 03-19-22 - 05-27-22    PT Start Time 0932    PT Stop Time 1015    PT Time Calculation (min) 43 min    Activity Tolerance Patient tolerated treatment well    Behavior During Therapy [facility] for tasks assessed/performed             Past Medical History:  Diagnosis Date   Hyperlipidemia    Hypertension    Hypogonadism in male 11/01/2016   Hypothyroidism    Multiple sclerosis ([place])    Neuromuscular disorder ([place])    Proliferative diabetic retinopathy(362.02)    Type 1 diabetes mellitus ([place]) dx'd 1994   Vitamin D insufficiency 11/29/2016   Past Surgical History:  Procedure Laterality Date   EYE SURGERY Bilateral    "laser for diabetic retinopathy"   FRACTURE SURGERY     IR VENO/EXT/UNI LEFT  03/24/2020   OPEN REDUCTION INTERNAL FIXATION (ORIF) TIBIA/FIBULA FRACTURE Right 10/30/2016   ORIF ANKLE FRACTURE Left 2015   ORIF TIBIA FRACTURE Right 10/30/2016   Procedure: OPEN REDUCTION INTERNAL FIXATION (ORIF) TIBIA FIBULA FRACTURE;  Surgeon: Altamese Kukuihaele, MD;  Location: [place];  Service: Orthopedics;  Laterality: Right;   RETINAL LASER PROCEDURE Bilateral    "for diabetic retinopathy"   Patient Active Problem List   Diagnosis Date Noted   PHN (postherpetic neuralgia) 03/12/2022   Acute deep vein thrombosis (DVT) of axillary vein of left upper extremity ([place]) 03/12/2022   Colon cancer screening 08/01/2021   Clinical diagnosis of COVID-19 12/28/2020   OAB (overactive bladder) 09/21/2020   Conjunctival injection, right 09/21/2020   Essential hypertension 09/21/2020   Urinary  dysfunction 09/21/2020   Thoracic outlet syndrome 05/03/2020   Controlled type 1 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 ([place]) 03/24/2020   Right epiretinal membrane 03/24/2020   Retinal exudates and deposits 03/24/2020   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   Nuclear sclerotic cataract of both eyes 03/24/2020   Gait disorder 03/23/2020   Benign prostatic hyperplasia without lower urinary tract symptoms 03/01/2020   Diabetic gastroparesis associated with type 1 diabetes mellitus ([place]) 03/01/2020   Uncontrolled type 1 diabetes mellitus with hyperglycemia ([place]) 03/01/2020   Gait disturbance 08/19/2019   Acquired diplegia ([place]) 08/19/2019   Vitamin D insufficiency 11/29/2016   Hypogonadism in male 11/01/2016   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[place])    Hypothyroidism    Hypertension    Abnormal liver function tests 03/06/2013   Type I (juvenile type) diabetes mellitus without mention of complication, uncontrolled 12/03/2007   HYPERCHOLESTEROLEMIA 11/12/2007   MULTIPLE SCLEROSIS 08/29/2007   PROLIFERATIVE DIABETIC RETINOPATHY 08/29/2007    ONSET DATE: Referral date 02-07-22  REFERRING DIAG: G35 (ICD-10-CM) - Multiple sclerosis ([place]) R26.9 (ICD-10-CM) - Gait disturbance   THERAPY DIAG:  Other abnormalities of gait and mobility  Muscle weakness (generalized)  Unsteadiness on feet  Other symptoms and signs involving the nervous system  Repeated falls  Foot drop, left  Foot drop, right  Rationale for Evaluation and Treatment Rehabilitation  SUBJECTIVE:  SUBJECTIVE STATEMENT: Pt denies falls or acute changes since eval.  Pt accompanied by: self  PERTINENT HISTORY: vitamin D insufficiency, Type 1 IDDM w/ polyneuropathy, diabetic  retinopathy, hypothyroidism, HTN, hypercholestrolemia, h/o Rt tibia fracture, h/o Lt ankle fracture with ORIF, MS with acquired diplegia   PAIN: pain from shingles on Rt side - in posterior shoulder area  Are you having pain? Yes: NPRS scale: 3/10 Pain location: posterior Rt shoulder, right axilla, and thoracic region Pain description: burning, itching Aggravating factors: unknown Relieving factors: medication helps some  PRECAUTIONS: Fall  WEIGHT BEARING RESTRICTIONS: No  FALLS: Has patient fallen in last 6 months? No  LIVING ENVIRONMENT: Lives with:  lives with their partner (girlfriend) Lives in: House/apartment Stairs: Yes; External: 6 steps; on right going up, on left going up, and can reach both Has following equipment at home: Gilford Rile - 2 wheeled, Wheelchair (manual), and shower chair  PLOF: Independent with basic ADLs, Independent with household mobility with device, Independent with transfers, and Needs assistance with homemaking  PATIENT GOALS: try to increase strength in lifting each leg up from hip in seated position (hip flexors); also increase strength in lifting Lt toes off floor (dorsiflexors)   OBJECTIVE:    TODAY'S TREATMENT:                                                                                  DATE: 03-19-22  GAIT: Gait pattern: pt uses vaulting gait pattern in which he lifts himself up and swings bil.LE's (paraplegic gait pattern) for increased speed, decreased step length- Left, decreased hip/knee flexion- Right, decreased hip/knee flexion- Left, decreased ankle dorsiflexion- Left, and poor foot clearance- Left Distance walked: various clinic distances into/out of clinic and within gym Assistive device utilized: Walker - 2 wheeled; pt is wearing his AFO on his RLE Level of assistance: SBA Comments:  Pt ambulates into gym from lobby requiring increased time w/ frequent stops due to spasticity preventing limb advancement left worse than right this  visit, pt attempts reciprocal walking when no one is waiting behind him and to and from countertop for exercises.  Reviewed runner's stretch and gastroc stretch in standing Reviewed hamstring stretch in long-sitting on mat, PT positions incline behind pt to assist in supporting trunk during stretch as pt relies on BUE to maintain upright in long-sitting.  Modified stretch using strap to increase stretch on calf and hamstring simultaneously w/ pt able to use body mechanics to independently maintain upright.  Trialed stretch w/ rolled towel below knees to prevent hyperextension, but pt actually improved hyperextension using strap.  -Incline crunches from blue foam ramp using 10# slamball chest press into sitting x15 > added rotational component w/ chest press to left and right x10 each direction -Attempted seated march w/ pt able to achieve 2-4 reps x3 sets successfully alternating w/ rest due to spasticity; edu on attempting at home and discussion of performance variance due to ongoing fluctuation in spasticity  PATIENT EDUCATION: Education details:  Attempt seated marches and continue HEP especially tall kneeling position. Person educated: Patient Education method: Explanation Education comprehension: verbalized understanding  HOME EXERCISE PROGRAM: [id number] (code from episode prior)   GOALS: Goals reviewed with patient?  Yes  SHORT TERM GOALS: Target date: 04/20/2022  Improve 5x sit to stand to </= 14 secs from standard chair with bil. UE support. Baseline:  16.97 secs Goal status: INITIAL  2.  Pt will stand unsupported and reach 10" outside BOS with 1 UE support for increased independence with ADL's in standing. Baseline:  Goal status: INITIAL  3.  Improve gait speed to >/= .7 ft/sec with RW to demo improved gait efficiency. Baseline: .35 ft/sec with RW Goal status: INITIAL  4.  Improve TUG score to </= 50 secs with RW to demo increased functional mobility. Baseline: 57.85 secs  with RW Goal status: INITIAL  5.  Amb. 115' with RW on flat, even surface without requiring seated rest period.  Baseline: 14' with RW Goal status: INITIAL  6.  Independent in HEP for stretching & strengthening. Baseline:  Goal status: INITIAL  LONG TERM GOALS: Target date: 05/18/2022  Improve 5x sit to stand to </= 12 secs from standard chair with bil. UE support. Baseline:  16.97 secs Goal status: INITIAL  2.   Pt will stand unsupported and reach 6" outside BOS without UE support for increased independence with ADL's in standing. Baseline:  Goal status: INITIAL  3.  Improve gait speed to >/= 1.0 ft/sec with RW to demo improved gait efficiency. Baseline: .35 ft/sec with RW Goal status: INITIAL  4.   Improve TUG score to </= 44 secs with RW to demo increased functional mobility. Baseline: 57.85 secs with RW Goal status: INITIAL  5.  Amb. 200' with RW on flat, even surface without requiring seated rest period.  Baseline: 26' with RW Goal status: INITIAL  6.  Independent in updated HEP for stretching & strengthening. Baseline:  Goal status: INITIAL ASSESSMENT:  CLINICAL IMPRESSION: Reviewed HEP initiated on evaluation with continued focus on both core and hip flexor strength in seated position.  Pt continues to prefer vaulting ambulatory pattern.  He would benefit from further closed chain, posterior chain, and core activation tasks.  OBJECTIVE IMPAIRMENTS: Abnormal gait, decreased activity tolerance, decreased balance, decreased endurance, difficulty walking, decreased ROM, decreased strength, increased muscle spasms, and impaired tone.   ACTIVITY LIMITATIONS: carrying, bending, standing, squatting, stairs, transfers, and locomotion level  PARTICIPATION LIMITATIONS: meal prep, cleaning, laundry, driving, shopping, community activity, and yard work  PERSONAL FACTORS: Past/current experiences, Time since onset of injury/illness/exacerbation, and progressive nature of MS  disease process  are also affecting patient's functional outcome.   REHAB POTENTIAL: Fair due to chronicity of deficits and progressive nature of MS disease process  CLINICAL DECISION MAKING: Stable/uncomplicated  EVALUATION COMPLEXITY: Low  PLAN:  PT FREQUENCY: 2x/week  PT DURATION: 8 weeks  PLANNED INTERVENTIONS: Therapeutic exercises, Therapeutic activity, Neuromuscular re-education, Balance training, Gait training, Patient/Family education, Self Care, Stair training, Orthotic/Fit training, and DME instructions  PLAN FOR NEXT SESSION: core strength in long-sitting; work on hip flexor and Lt DF strengthening; SciFit, standing balance, 10# slam ball in tall kneeling   Bary Richard, PT, DPT 03/29/2022, 5:13 PM

## 2022-04-02 ENCOUNTER — Encounter: Payer: Self-pay | Admitting: Physical Therapy

## 2022-04-02 ENCOUNTER — Ambulatory Visit: Payer: Medicare PPO | Admitting: Physical Therapy

## 2022-04-02 DIAGNOSIS — R29818 Other symptoms and signs involving the nervous system: Secondary | ICD-10-CM | POA: Diagnosis not present

## 2022-04-02 DIAGNOSIS — M6281 Muscle weakness (generalized): Secondary | ICD-10-CM

## 2022-04-02 DIAGNOSIS — R2681 Unsteadiness on feet: Secondary | ICD-10-CM | POA: Diagnosis not present

## 2022-04-02 DIAGNOSIS — M21371 Foot drop, right foot: Secondary | ICD-10-CM | POA: Diagnosis not present

## 2022-04-02 DIAGNOSIS — R2689 Other abnormalities of gait and mobility: Secondary | ICD-10-CM | POA: Diagnosis not present

## 2022-04-02 DIAGNOSIS — R296 Repeated falls: Secondary | ICD-10-CM | POA: Diagnosis not present

## 2022-04-02 DIAGNOSIS — M21372 Foot drop, left foot: Secondary | ICD-10-CM | POA: Diagnosis not present

## 2022-04-02 NOTE — Therapy (Signed)
OUTPATIENT PHYSICAL THERAPY NEURO TREATMENT   Patient Name: Johnathan Ross MRN: [id number] DOB:[birth year], 51 y.o., male Today's Date: 04/02/2022   PCP: Janith Lima, MD REFERRING PROVIDER: Britt Bottom, MD    PT End of Session - 04/02/22 1928     Visit Number 3    Number of Visits 17    Date for PT Re-Evaluation 05/18/22    Authorization Type [employer]    Authorization Time Period 03-19-22 - 05-27-22    Authorization - Visit Number 3    Authorization - Number of Visits 16    PT Start Time 0737    PT Stop Time 0930    PT Time Calculation (min) 43 min    Equipment Utilized During Treatment Other (comment)   Sitfit, kay bench, Slamball   Activity Tolerance Patient tolerated treatment well    Behavior During Therapy [facility] for tasks assessed/performed              Past Medical History:  Diagnosis Date   Hyperlipidemia    Hypertension    Hypogonadism in male 11/01/2016   Hypothyroidism    Multiple sclerosis ([place])    Neuromuscular disorder ([place])    Proliferative diabetic retinopathy(362.02)    Type 1 diabetes mellitus ([place]) dx'd 1994   Vitamin D insufficiency 11/29/2016   Past Surgical History:  Procedure Laterality Date   EYE SURGERY Bilateral    "laser for diabetic retinopathy"   FRACTURE SURGERY     IR VENO/EXT/UNI LEFT  03/24/2020   OPEN REDUCTION INTERNAL FIXATION (ORIF) TIBIA/FIBULA FRACTURE Right 10/30/2016   ORIF ANKLE FRACTURE Left 2015   ORIF TIBIA FRACTURE Right 10/30/2016   Procedure: OPEN REDUCTION INTERNAL FIXATION (ORIF) TIBIA FIBULA FRACTURE;  Surgeon: Altamese Maywood, MD;  Location: [place];  Service: Orthopedics;  Laterality: Right;   RETINAL LASER PROCEDURE Bilateral    "for diabetic retinopathy"   Patient Active Problem List   Diagnosis Date Noted   PHN (postherpetic neuralgia) 03/12/2022   Acute deep vein thrombosis (DVT) of axillary vein of left upper extremity ([place]) 03/12/2022   Colon cancer screening 08/01/2021   Clinical  diagnosis of COVID-19 12/28/2020   OAB (overactive bladder) 09/21/2020   Conjunctival injection, right 09/21/2020   Essential hypertension 09/21/2020   Urinary dysfunction 09/21/2020   Thoracic outlet syndrome 05/03/2020   Controlled type 1 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 ([place]) 03/24/2020   Right epiretinal membrane 03/24/2020   Retinal exudates and deposits 03/24/2020   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   Nuclear sclerotic cataract of both eyes 03/24/2020   Gait disorder 03/23/2020   Benign prostatic hyperplasia without lower urinary tract symptoms 03/01/2020   Diabetic gastroparesis associated with type 1 diabetes mellitus ([place]) 03/01/2020   Uncontrolled type 1 diabetes mellitus with hyperglycemia ([place]) 03/01/2020   Gait disturbance 08/19/2019   Acquired diplegia ([place]) 08/19/2019   Vitamin D insufficiency 11/29/2016   Hypogonadism in male 11/01/2016   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[place])    Hypothyroidism    Hypertension    Abnormal liver function tests 03/06/2013   Type I (juvenile type) diabetes mellitus without mention of complication, uncontrolled 12/03/2007   HYPERCHOLESTEROLEMIA 11/12/2007   MULTIPLE SCLEROSIS 08/29/2007   PROLIFERATIVE DIABETIC RETINOPATHY 08/29/2007    ONSET DATE: Referral date 02-07-22  REFERRING DIAG: G35 (ICD-10-CM) - Multiple sclerosis ([place]) R26.9 (ICD-10-CM) - Gait disturbance   THERAPY DIAG:  Other abnormalities of gait and mobility  Muscle weakness (generalized)  Other symptoms and signs  involving the nervous system  Rationale for Evaluation and Treatment Rehabilitation  SUBJECTIVE:                                                                                                                                                                                             SUBJECTIVE STATEMENT: Pt states he liked the core exercise he did in previous session last week,  performed in partially reclined position on chair  Pt accompanied by: self  PERTINENT HISTORY: vitamin D insufficiency, Type 1 IDDM w/ polyneuropathy, diabetic retinopathy, hypothyroidism, HTN, hypercholestrolemia, h/o Rt tibia fracture, h/o Lt ankle fracture with ORIF, MS with acquired diplegia   PAIN: pain from shingles on Rt side - in posterior shoulder area  Are you having pain? Yes: NPRS scale: 2/10 Pain location: posterior Rt shoulder, right axilla, and thoracic region Pain description: burning, itching Aggravating factors: unknown Relieving factors: medication helps some  PRECAUTIONS: Fall  WEIGHT BEARING RESTRICTIONS: No  FALLS: Has patient fallen in last 6 months? No  LIVING ENVIRONMENT: Lives with:  lives with their partner (girlfriend) Lives in: House/apartment Stairs: Yes; External: 6 steps; on right going up, on left going up, and can reach both Has following equipment at home: Gilford Rile - 2 wheeled, Wheelchair (manual), and shower chair  PLOF: Independent with basic ADLs, Independent with household mobility with device, Independent with transfers, and Needs assistance with homemaking  PATIENT GOALS: try to increase strength in lifting each leg up from hip in seated position (hip flexors); also increase strength in lifting Lt toes off floor (dorsiflexors)   OBJECTIVE:    TODAY'S TREATMENT:   04-02-22  GAIT: Gait pattern: pt uses vaulting gait pattern in which he lifts himself up and swings bil.LE's (paraplegic gait pattern) for increased speed, decreased step length- Left, decreased hip/knee flexion- Right, decreased hip/knee flexion- Left, decreased ankle dorsiflexion- Left, and poor foot clearance- Left Distance walked: various clinic distances into/out of clinic and within gym Assistive device utilized: Walker - 2 wheeled; pt is wearing his AFO on his RLE Level of assistance: SBA Comments:  Pt ambulates into gym from lobby requiring increased time w/ frequent stops  due to spasticity; uses swing thru gait pattern for increased speed; uses reciprocal gait pattern when no one is behind him and when not in a hurry   NeuroRe-ed:    Reviewed hamstring stretch in long-sitting with pt seated in chair for back support; attempted anterior pelvic tilt to increase stretch but pt has very minimal to no pelvic mobility.   Modified stretch using gait belt to increase stretch on calf and hamstring simultaneously   -Incline crunches from  inverted chair - initially used 2 pillows, then progressed to use of 1 pillow only using 10# slamball chest press into sitting x15 > added rotational component w/ chest press to left and right x 15 each direction; 1 pillow used ; chair 30.5" back from edge of mat to increase difficulty   Sitting on SitFit; holding 10# slamball  - do Rt/Lt rotation 10 reps ; letter "V" 10 reps on SitFit  Seated on SitFit - knee extension Rt and Lt 10 reps each:  seated hip flexion 10 reps   Alternate knee extension with UE flexion - 5 reps - 3-5 sec hold  Tall kneeling - partial squats 10 reps with UE support on kay bench; hip abduction and hip extension 10 reps each leg in tall kneeling position   PATIENT EDUCATION: Education details:  Attempt seated marches and continue HEP especially tall kneeling position. Person educated: Patient Education method: Explanation Education comprehension: verbalized understanding  HOME EXERCISE PROGRAM: [id number] (code from episode prior)   GOALS: Goals reviewed with patient? Yes  SHORT TERM GOALS: Target date: 04/20/2022  Improve 5x sit to stand to </= 14 secs from standard chair with bil. UE support. Baseline:  16.97 secs Goal status: INITIAL  2.  Pt will stand unsupported and reach 10" outside BOS with 1 UE support for increased independence with ADL's in standing. Baseline:  Goal status: INITIAL  3.  Improve gait speed to >/= .7 ft/sec with RW to demo improved gait efficiency. Baseline: .35 ft/sec  with RW Goal status: INITIAL  4.  Improve TUG score to </= 50 secs with RW to demo increased functional mobility. Baseline: 57.85 secs with RW Goal status: INITIAL  5.  Amb. 115' with RW on flat, even surface without requiring seated rest period.  Baseline: 24' with RW Goal status: INITIAL  6.  Independent in HEP for stretching & strengthening. Baseline:  Goal status: INITIAL  LONG TERM GOALS: Target date: 05/18/2022  Improve 5x sit to stand to </= 12 secs from standard chair with bil. UE support. Baseline:  16.97 secs Goal status: INITIAL  2.   Pt will stand unsupported and reach 6" outside BOS without UE support for increased independence with ADL's in standing. Baseline:  Goal status: INITIAL  3.  Improve gait speed to >/= 1.0 ft/sec with RW to demo improved gait efficiency. Baseline: .35 ft/sec with RW Goal status: INITIAL  4.   Improve TUG score to </= 44 secs with RW to demo increased functional mobility. Baseline: 57.85 secs with RW Goal status: INITIAL  5.  Amb. 200' with RW on flat, even surface without requiring seated rest period.  Baseline: 19' with RW Goal status: INITIAL  6.  Independent in updated HEP for stretching & strengthening. Baseline:  Goal status: INITIAL ASSESSMENT:  CLINICAL IMPRESSION: PT session focused on core stabilization and abdominal strengthening exercises with use of 10# Slamball with trunk flexion from reclined position on inverted chair.  Pt able to perform tall kneeling exercise for trunk strengthening with UE support needed for balance.  Pt has very minimal pelvic mobility with inability to perform pelvic rocking and move pelvis into anterior pelvic tilt. Pt has increased thoracic kyphosis (rounded back) when performing hamstring stretching in long sitting position due to inability to perform anterior pelvic tilt.  Cont with POC.  OBJECTIVE IMPAIRMENTS: Abnormal gait, decreased activity tolerance, decreased balance, decreased  endurance, difficulty walking, decreased ROM, decreased strength, increased muscle spasms, and impaired tone.   ACTIVITY LIMITATIONS: carrying,  bending, standing, squatting, stairs, transfers, and locomotion level  PARTICIPATION LIMITATIONS: meal prep, cleaning, laundry, driving, shopping, community activity, and yard work  PERSONAL FACTORS: Past/current experiences, Time since onset of injury/illness/exacerbation, and progressive nature of MS disease process  are also affecting patient's functional outcome.   REHAB POTENTIAL: Fair due to chronicity of deficits and progressive nature of MS disease process  CLINICAL DECISION MAKING: Stable/uncomplicated  EVALUATION COMPLEXITY: Low  PLAN:  PT FREQUENCY: 2x/week  PT DURATION: 8 weeks  PLANNED INTERVENTIONS: Therapeutic exercises, Therapeutic activity, Neuromuscular re-education, Balance training, Gait training, Patient/Family education, Self Care, Stair training, Orthotic/Fit training, and DME instructions  PLAN FOR NEXT SESSION: core strength in long-sitting; work on hip flexor and Lt DF strengthening; SciFit, standing balance, 10# slam ball in tall kneeling   Olivianna Higley, [place], PT 04/02/2022, 7:33 PM

## 2022-04-05 ENCOUNTER — Ambulatory Visit: Payer: Medicare PPO | Admitting: Physical Therapy

## 2022-04-05 DIAGNOSIS — R2689 Other abnormalities of gait and mobility: Secondary | ICD-10-CM | POA: Diagnosis not present

## 2022-04-05 DIAGNOSIS — M21372 Foot drop, left foot: Secondary | ICD-10-CM | POA: Diagnosis not present

## 2022-04-05 DIAGNOSIS — R296 Repeated falls: Secondary | ICD-10-CM | POA: Diagnosis not present

## 2022-04-05 DIAGNOSIS — R29818 Other symptoms and signs involving the nervous system: Secondary | ICD-10-CM | POA: Diagnosis not present

## 2022-04-05 DIAGNOSIS — M21371 Foot drop, right foot: Secondary | ICD-10-CM | POA: Diagnosis not present

## 2022-04-05 DIAGNOSIS — R2681 Unsteadiness on feet: Secondary | ICD-10-CM | POA: Diagnosis not present

## 2022-04-05 DIAGNOSIS — M6281 Muscle weakness (generalized): Secondary | ICD-10-CM

## 2022-04-05 NOTE — Therapy (Signed)
OUTPATIENT PHYSICAL THERAPY NEURO TREATMENT   Patient Name: Johnathan Ross MRN: [id number] DOB:14-Jan-1971, 51 y.o., male Today's Date: 04/06/2022   PCP: Janith Lima, MD REFERRING PROVIDER: Britt Bottom, MD    PT End of Session - 04/05/22 1013     Visit Number 4    Number of Visits 17    Date for PT Re-Evaluation 05/18/22    Authorization Type [employer]    Authorization Time Period 03-19-22 - 05-27-22    Authorization - Visit Number 4    Authorization - Number of Visits 16    PT Start Time 0174    PT Stop Time 1016    PT Time Calculation (min) 41 min    Equipment Utilized During Treatment Other (comment)   Sitfit, kay bench, Slamball   Activity Tolerance Patient tolerated treatment well    Behavior During Therapy [facility] for tasks assessed/performed               Past Medical History:  Diagnosis Date   Hyperlipidemia    Hypertension    Hypogonadism in male 11/01/2016   Hypothyroidism    Multiple sclerosis ([place])    Neuromuscular disorder ([place])    Proliferative diabetic retinopathy(362.02)    Type 1 diabetes mellitus ([place]) dx'd 1994   Vitamin D insufficiency 11/29/2016   Past Surgical History:  Procedure Laterality Date   EYE SURGERY Bilateral    "laser for diabetic retinopathy"   FRACTURE SURGERY     IR VENO/EXT/UNI LEFT  03/24/2020   OPEN REDUCTION INTERNAL FIXATION (ORIF) TIBIA/FIBULA FRACTURE Right 10/30/2016   ORIF ANKLE FRACTURE Left 2015   ORIF TIBIA FRACTURE Right 10/30/2016   Procedure: OPEN REDUCTION INTERNAL FIXATION (ORIF) TIBIA FIBULA FRACTURE;  Surgeon: Altamese Bennington, MD;  Location: [place];  Service: Orthopedics;  Laterality: Right;   RETINAL LASER PROCEDURE Bilateral    "for diabetic retinopathy"   Patient Active Problem List   Diagnosis Date Noted   PHN (postherpetic neuralgia) 03/12/2022   Acute deep vein thrombosis (DVT) of axillary vein of left upper extremity ([place]) 03/12/2022   Colon cancer screening 08/01/2021   Clinical  diagnosis of COVID-19 12/28/2020   OAB (overactive bladder) 09/21/2020   Conjunctival injection, right 09/21/2020   Essential hypertension 09/21/2020   Urinary dysfunction 09/21/2020   Thoracic outlet syndrome 05/03/2020   Controlled type 1 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 ([place]) 03/24/2020   Right epiretinal membrane 03/24/2020   Retinal exudates and deposits 03/24/2020   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   Nuclear sclerotic cataract of both eyes 03/24/2020   Gait disorder 03/23/2020   Benign prostatic hyperplasia without lower urinary tract symptoms 03/01/2020   Diabetic gastroparesis associated with type 1 diabetes mellitus ([place]) 03/01/2020   Uncontrolled type 1 diabetes mellitus with hyperglycemia ([place]) 03/01/2020   Gait disturbance 08/19/2019   Acquired diplegia ([place]) 08/19/2019   Vitamin D insufficiency 11/29/2016   Hypogonadism in male 11/01/2016   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[place])    Hypothyroidism    Hypertension    Abnormal liver function tests 03/06/2013   Type I (juvenile type) diabetes mellitus without mention of complication, uncontrolled 12/03/2007   HYPERCHOLESTEROLEMIA 11/12/2007   MULTIPLE SCLEROSIS 08/29/2007   PROLIFERATIVE DIABETIC RETINOPATHY 08/29/2007    ONSET DATE: Referral date 02-07-22  REFERRING DIAG: G35 (ICD-10-CM) - Multiple sclerosis ([place]) R26.9 (ICD-10-CM) - Gait disturbance   THERAPY DIAG:  Other abnormalities of gait and mobility  Muscle weakness (generalized)  Other symptoms and  signs involving the nervous system  Rationale for Evaluation and Treatment Rehabilitation  SUBJECTIVE:                                                                                                                                                                                             SUBJECTIVE STATEMENT: Pt states he wants to see if he can perform forward exercise (trunk flexion) with  chair moved further back than it was in previous session;  states he looked at videos online to see how to do pelvic tilt but still has hard time performing the movement Pt accompanied by: self  PERTINENT HISTORY: vitamin D insufficiency, Type 1 IDDM w/ polyneuropathy, diabetic retinopathy, hypothyroidism, HTN, hypercholestrolemia, h/o Rt tibia fracture, h/o Lt ankle fracture with ORIF, MS with acquired diplegia   PAIN: pain from shingles on Rt side - in posterior shoulder area  Are you having pain? Yes: NPRS scale: 2/10 Pain location: posterior Rt shoulder, right axilla, and thoracic region Pain description: burning, itching Aggravating factors: unknown Relieving factors: medication helps some  PRECAUTIONS: Fall  WEIGHT BEARING RESTRICTIONS: No  FALLS: Has patient fallen in last 6 months? No  LIVING ENVIRONMENT: Lives with:  lives with their partner (girlfriend) Lives in: House/apartment Stairs: Yes; External: 6 steps; on right going up, on left going up, and can reach both Has following equipment at home: Gilford Rile - 2 wheeled, Wheelchair (manual), and shower chair  PLOF: Independent with basic ADLs, Independent with household mobility with device, Independent with transfers, and Needs assistance with homemaking  PATIENT GOALS: try to increase strength in lifting each leg up from hip in seated position (hip flexors); also increase strength in lifting Lt toes off floor (dorsiflexors)   OBJECTIVE:    TODAY'S TREATMENT:   04-05-22  GAIT: Gait pattern: pt uses vaulting gait pattern in which he lifts himself up and swings bil.LE's (paraplegic gait pattern) for increased speed, decreased step length- Left, decreased hip/knee flexion- Right, decreased hip/knee flexion- Left, decreased ankle dorsiflexion- Left, and poor foot clearance- Left Distance walked: various clinic distances into/out of clinic and within gym Assistive device utilized: Walker - 2 wheeled; pt is wearing his AFO on his  RLE Level of assistance: SBA Comments:  Pt ambulates into gym from lobby requiring increased time w/ frequent stops due to spasticity; uses swing thru gait pattern for increased speed; uses reciprocal gait pattern when no one is behind him and when not in a hurry   NeuroRe-ed:     -Incline crunches from inverted chair - 1 pillow only using 10# slamball chest press into sitting x15 > added rotational component w/  chest press to left and right x 15 each direction; 1 pillow used ; chair 31" back from edge of mat to increase difficulty (chair 1/2 inch further back from edge of mat than placement in previous PT session)   Pt sat on SitFit -performed pelvic rocking to increase pelvic mobility - pt able to perform anterior pelvic tilts on SitFit  Sitting on SitFit; holding 10# slamball  - do Rt/Lt rotation 10 reps ; letter "V" 10 reps on SitFit  Tall kneeling - partial squats 10 reps with UE support on kay bench; hip abduction and hip extension 10 reps each leg in tall kneeling position Pt performed unilateral UE flexion 5 reps each arm; performed 3 reps bil. UE lifts with moderate difficulty  TherEx:  Pt performed SciFit level 3.0 x 5" with LE's only for strengthening and reciprocal movement  PATIENT EDUCATION: Education details:  Attempt seated marches and continue HEP especially tall kneeling position. Person educated: Patient Education method: Explanation Education comprehension: verbalized understanding  HOME EXERCISE PROGRAM: [id number] (code from episode prior)   GOALS: Goals reviewed with patient? Yes  SHORT TERM GOALS: Target date: 04/20/2022  Improve 5x sit to stand to </= 14 secs from standard chair with bil. UE support. Baseline:  16.97 secs Goal status: INITIAL  2.  Pt will stand unsupported and reach 10" outside BOS with 1 UE support for increased independence with ADL's in standing. Baseline:  Goal status: INITIAL  3.  Improve gait speed to >/= .7 ft/sec with RW to  demo improved gait efficiency. Baseline: .35 ft/sec with RW Goal status: INITIAL  4.  Improve TUG score to </= 50 secs with RW to demo increased functional mobility. Baseline: 57.85 secs with RW Goal status: INITIAL  5.  Amb. 115' with RW on flat, even surface without requiring seated rest period.  Baseline: 30' with RW Goal status: INITIAL  6.  Independent in HEP for stretching & strengthening. Baseline:  Goal status: INITIAL  LONG TERM GOALS: Target date: 05/18/2022  Improve 5x sit to stand to </= 12 secs from standard chair with bil. UE support. Baseline:  16.97 secs Goal status: INITIAL  2.   Pt will stand unsupported and reach 6" outside BOS without UE support for increased independence with ADL's in standing. Baseline:  Goal status: INITIAL  3.  Improve gait speed to >/= 1.0 ft/sec with RW to demo improved gait efficiency. Baseline: .35 ft/sec with RW Goal status: INITIAL  4.   Improve TUG score to </= 44 secs with RW to demo increased functional mobility. Baseline: 57.85 secs with RW Goal status: INITIAL  5.  Amb. 200' with RW on flat, even surface without requiring seated rest period.  Baseline: 75' with RW Goal status: INITIAL  6.  Independent in updated HEP for stretching & strengthening. Baseline:  Goal status: INITIAL ASSESSMENT:  CLINICAL IMPRESSION: PT session focused on core stabilization and abdominal strengthening exercises with use of 10# Slamball with trunk flexion from reclined position with chair moved 1/2" back from edge of mat (to 31" away from edge) for increased difficulty per pt's request.  Pt stated he felt muscles in upper trunk working with this exercise.  Pt able to perform anterior pelvic tilt and pelvic rocking demonstrating increased pelvic mobility than was able to be performed in previous PT session when pt was sitting in standard chair.  Cont with POC.  OBJECTIVE IMPAIRMENTS: Abnormal gait, decreased activity tolerance, decreased  balance, decreased endurance, difficulty walking, decreased ROM, decreased  strength, increased muscle spasms, and impaired tone.   ACTIVITY LIMITATIONS: carrying, bending, standing, squatting, stairs, transfers, and locomotion level  PARTICIPATION LIMITATIONS: meal prep, cleaning, laundry, driving, shopping, community activity, and yard work  PERSONAL FACTORS: Past/current experiences, Time since onset of injury/illness/exacerbation, and progressive nature of MS disease process  are also affecting patient's functional outcome.   REHAB POTENTIAL: Fair due to chronicity of deficits and progressive nature of MS disease process  CLINICAL DECISION MAKING: Stable/uncomplicated  EVALUATION COMPLEXITY: Low  PLAN:  PT FREQUENCY: 2x/week  PT DURATION: 8 weeks  PLANNED INTERVENTIONS: Therapeutic exercises, Therapeutic activity, Neuromuscular re-education, Balance training, Gait training, Patient/Family education, Self Care, Stair training, Orthotic/Fit training, and DME instructions  PLAN FOR NEXT SESSION: core strength in long-sitting; work on hip flexor and Lt DF strengthening; SciFit, standing balance, 10# slam ball in tall kneeling   Ruffus Kamaka, [place], PT 04/06/2022, 4:25 PM

## 2022-04-06 ENCOUNTER — Encounter: Payer: Self-pay | Admitting: Physical Therapy

## 2022-04-09 ENCOUNTER — Ambulatory Visit: Payer: Medicare PPO | Admitting: Physical Therapy

## 2022-04-09 DIAGNOSIS — R2689 Other abnormalities of gait and mobility: Secondary | ICD-10-CM

## 2022-04-09 DIAGNOSIS — R29818 Other symptoms and signs involving the nervous system: Secondary | ICD-10-CM | POA: Diagnosis not present

## 2022-04-09 DIAGNOSIS — M6281 Muscle weakness (generalized): Secondary | ICD-10-CM

## 2022-04-09 DIAGNOSIS — R2681 Unsteadiness on feet: Secondary | ICD-10-CM | POA: Diagnosis not present

## 2022-04-09 DIAGNOSIS — M21372 Foot drop, left foot: Secondary | ICD-10-CM | POA: Diagnosis not present

## 2022-04-09 DIAGNOSIS — M21371 Foot drop, right foot: Secondary | ICD-10-CM | POA: Diagnosis not present

## 2022-04-09 DIAGNOSIS — R296 Repeated falls: Secondary | ICD-10-CM | POA: Diagnosis not present

## 2022-04-09 NOTE — Therapy (Signed)
OUTPATIENT PHYSICAL THERAPY NEURO TREATMENT   Patient Name: Johnathan Ross MRN: [id number] DOB:[birth year], 51 y.o., male Today's Date: 04/10/2022   PCP: Janith Lima, MD REFERRING PROVIDER: Britt Bottom, MD    PT End of Session - 04/10/22 1317     Visit Number 5    Number of Visits 17    Date for PT Re-Evaluation 05/18/22    Authorization Type [employer]    Authorization Time Period 03-19-22 - 05-27-22    Authorization - Visit Number 4    Authorization - Number of Visits 16    PT Start Time 2542    PT Stop Time 0930    PT Time Calculation (min) 40 min    Equipment Utilized During Treatment Other (comment)   Sitfit, kay bench, Slamball   Activity Tolerance Patient tolerated treatment well    Behavior During Therapy [facility] for tasks assessed/performed                Past Medical History:  Diagnosis Date   Hyperlipidemia    Hypertension    Hypogonadism in male 11/01/2016   Hypothyroidism    Multiple sclerosis ([place])    Neuromuscular disorder ([place])    Proliferative diabetic retinopathy(362.02)    Type 1 diabetes mellitus ([place]) dx'd 1994   Vitamin D insufficiency 11/29/2016   Past Surgical History:  Procedure Laterality Date   EYE SURGERY Bilateral    "laser for diabetic retinopathy"   FRACTURE SURGERY     IR VENO/EXT/UNI LEFT  03/24/2020   OPEN REDUCTION INTERNAL FIXATION (ORIF) TIBIA/FIBULA FRACTURE Right 10/30/2016   ORIF ANKLE FRACTURE Left 2015   ORIF TIBIA FRACTURE Right 10/30/2016   Procedure: OPEN REDUCTION INTERNAL FIXATION (ORIF) TIBIA FIBULA FRACTURE;  Surgeon: Altamese Raymond, MD;  Location: [place];  Service: Orthopedics;  Laterality: Right;   RETINAL LASER PROCEDURE Bilateral    "for diabetic retinopathy"   Patient Active Problem List   Diagnosis Date Noted   PHN (postherpetic neuralgia) 03/12/2022   Acute deep vein thrombosis (DVT) of axillary vein of left upper extremity ([place]) 03/12/2022   Colon cancer screening 08/01/2021   Clinical  diagnosis of COVID-19 12/28/2020   OAB (overactive bladder) 09/21/2020   Conjunctival injection, right 09/21/2020   Essential hypertension 09/21/2020   Urinary dysfunction 09/21/2020   Thoracic outlet syndrome 05/03/2020   Controlled type 1 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 ([place]) 03/24/2020   Right epiretinal membrane 03/24/2020   Retinal exudates and deposits 03/24/2020   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   Nuclear sclerotic cataract of both eyes 03/24/2020   Gait disorder 03/23/2020   Benign prostatic hyperplasia without lower urinary tract symptoms 03/01/2020   Diabetic gastroparesis associated with type 1 diabetes mellitus ([place]) 03/01/2020   Uncontrolled type 1 diabetes mellitus with hyperglycemia ([place]) 03/01/2020   Gait disturbance 08/19/2019   Acquired diplegia ([place]) 08/19/2019   Vitamin D insufficiency 11/29/2016   Hypogonadism in male 11/01/2016   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[place])    Hypothyroidism    Hypertension    Abnormal liver function tests 03/06/2013   Type I (juvenile type) diabetes mellitus without mention of complication, uncontrolled 12/03/2007   HYPERCHOLESTEROLEMIA 11/12/2007   MULTIPLE SCLEROSIS 08/29/2007   PROLIFERATIVE DIABETIC RETINOPATHY 08/29/2007    ONSET DATE: Referral date 02-07-22  REFERRING DIAG: G35 (ICD-10-CM) - Multiple sclerosis ([place]) R26.9 (ICD-10-CM) - Gait disturbance   THERAPY DIAG:  Other abnormalities of gait and mobility  Muscle weakness (generalized)  Rationale for  Evaluation and Treatment Rehabilitation  SUBJECTIVE:                                                                                                                                                                                             SUBJECTIVE STATEMENT: Pt reports no changes - says tone in left leg kicked in when he was walking from lobby to gym area; pt alternating between swing  thru/vaulting gait pattern and reciprocal pattern Pt accompanied by: self  PERTINENT HISTORY: vitamin D insufficiency, Type 1 IDDM w/ polyneuropathy, diabetic retinopathy, hypothyroidism, HTN, hypercholestrolemia, h/o Rt tibia fracture, h/o Lt ankle fracture with ORIF, MS with acquired diplegia   PAIN: pain from shingles on Rt side - in posterior shoulder area  Are you having pain? No  PRECAUTIONS: Fall  WEIGHT BEARING RESTRICTIONS: No  FALLS: Has patient fallen in last 6 months? No  LIVING ENVIRONMENT: Lives with:  lives with their partner (girlfriend) Lives in: House/apartment Stairs: Yes; External: 6 steps; on right going up, on left going up, and can reach both Has following equipment at home: Gilford Rile - 2 wheeled, Wheelchair (manual), and shower chair  PLOF: Independent with basic ADLs, Independent with household mobility with device, Independent with transfers, and Needs assistance with homemaking  PATIENT GOALS: try to increase strength in lifting each leg up from hip in seated position (hip flexors); also increase strength in lifting Lt toes off floor (dorsiflexors)   OBJECTIVE:    TODAY'S TREATMENT:   04-09-22  GAIT: Gait pattern: pt uses vaulting gait pattern in which he lifts himself up and swings bil.LE's (paraplegic gait pattern) for increased speed, decreased step length- Left, decreased hip/knee flexion- Right, decreased hip/knee flexion- Left, decreased ankle dorsiflexion- Left, and poor foot clearance- Left Distance walked: various clinic distances into/out of clinic and within gym Assistive device utilized: Walker - 2 wheeled; pt is wearing his AFO on his RLE Level of assistance: SBA Comments:  Pt ambulates into gym from lobby requiring increased time w/ frequent stops due to spasticity; uses swing thru gait pattern for increased speed; uses reciprocal gait pattern when no one is behind him and when not in a hurry   NeuroRe-ed:     -Incline crunches from  inverted chair - 1 pillow only using 10# slamball chest press into sitting x 20:  added rotational component w/ chest press to left and right x 15 each direction; 1 pillow used ; chair 30" back from edge of mat to increase difficulty (chair 1/2 inch further back from edge of mat than placement in previous PT session) ; attempted distance of 32" for chair from edge  of mat   Pt sat on SitFit -performed pelvic rocking to increase pelvic mobility - pt able to perform anterior pelvic tilts on SitFit  Sitting on SitFit  -  Rt/Lt rotation with arms flexed at 90 degrees 10 reps each;  lifting hands together to approx. 90 degrees 10 reps  Pt performed LAQ's 10 reps each leg; hip flexion each leg while sitting on mat 10 reps each leg  Tall kneeling - partial squats 10 reps with UE support on inverted chair; hip abduction and hip extension 10 reps each leg in tall kneeling position  Pt performed lifting each arm up 3 reps each; bil. UE's together 3 reps  TherEx:  Pt performed NuStep  level 3.0, seat 14 x 5" with LE's only for strengthening and reciprocal movement  PATIENT EDUCATION: Education details:  Attempt seated marches and continue HEP especially tall kneeling position. Person educated: Patient Education method: Explanation Education comprehension: verbalized understanding  HOME EXERCISE PROGRAM: [id number] (code from episode prior)   GOALS: Goals reviewed with patient? Yes  SHORT TERM GOALS: Target date: 04/20/2022  Improve 5x sit to stand to </= 14 secs from standard chair with bil. UE support. Baseline:  16.97 secs Goal status: INITIAL  2.  Pt will stand unsupported and reach 10" outside BOS with 1 UE support for increased independence with ADL's in standing. Baseline:  Goal status: INITIAL  3.  Improve gait speed to >/= .7 ft/sec with RW to demo improved gait efficiency. Baseline: .35 ft/sec with RW Goal status: INITIAL  4.  Improve TUG score to </= 50 secs with RW to demo  increased functional mobility. Baseline: 57.85 secs with RW Goal status: INITIAL  5.  Amb. 115' with RW on flat, even surface without requiring seated rest period.  Baseline: 22' with RW Goal status: INITIAL  6.  Independent in HEP for stretching & strengthening. Baseline:  Goal status: INITIAL  LONG TERM GOALS: Target date: 05/18/2022  Improve 5x sit to stand to </= 12 secs from standard chair with bil. UE support. Baseline:  16.97 secs Goal status: INITIAL  2.   Pt will stand unsupported and reach 6" outside BOS without UE support for increased independence with ADL's in standing. Baseline:  Goal status: INITIAL  3.  Improve gait speed to >/= 1.0 ft/sec with RW to demo improved gait efficiency. Baseline: .35 ft/sec with RW Goal status: INITIAL  4.   Improve TUG score to </= 44 secs with RW to demo increased functional mobility. Baseline: 57.85 secs with RW Goal status: INITIAL  5.  Amb. 200' with RW on flat, even surface without requiring seated rest period.  Baseline: 71' with RW Goal status: INITIAL  6.  Independent in updated HEP for stretching & strengthening. Baseline:  Goal status: INITIAL ASSESSMENT:  CLINICAL IMPRESSION: PT session focused on core stabilization and LE strengthening with pt sitting on SitFit for compliant surface for increased challenge with trunk stabilization.  Attempted trunk flexion exercise with chair placed at 32" away from edge of mat - with 1 pillow used; pt unable to flex trunk and transfer to upright sitting.  2 pillows used and pt still unable to flex trunk, so chair was moved up to 30" away from edge of mat and pt able to perform exercise.   Cont with POC.  OBJECTIVE IMPAIRMENTS: Abnormal gait, decreased activity tolerance, decreased balance, decreased endurance, difficulty walking, decreased ROM, decreased strength, increased muscle spasms, and impaired tone.   ACTIVITY LIMITATIONS: carrying, bending, standing,  squatting, stairs,  transfers, and locomotion level  PARTICIPATION LIMITATIONS: meal prep, cleaning, laundry, driving, shopping, community activity, and yard work  PERSONAL FACTORS: Past/current experiences, Time since onset of injury/illness/exacerbation, and progressive nature of MS disease process  are also affecting patient's functional outcome.   REHAB POTENTIAL: Fair due to chronicity of deficits and progressive nature of MS disease process  CLINICAL DECISION MAKING: Stable/uncomplicated  EVALUATION COMPLEXITY: Low  PLAN:  PT FREQUENCY: 2x/week  PT DURATION: 8 weeks  PLANNED INTERVENTIONS: Therapeutic exercises, Therapeutic activity, Neuromuscular re-education, Balance training, Gait training, Patient/Family education, Self Care, Stair training, Orthotic/Fit training, and DME instructions  PLAN FOR NEXT SESSION: core strength in long-sitting; work on hip flexor and Lt DF strengthening; SciFit, standing balance, 10# slam ball in tall kneeling   Raini Tiley, [place], PT 04/10/2022, 3:07 PM

## 2022-04-10 ENCOUNTER — Encounter: Payer: Self-pay | Admitting: Physical Therapy

## 2022-04-12 ENCOUNTER — Ambulatory Visit: Payer: Medicare PPO | Admitting: Physical Therapy

## 2022-04-12 ENCOUNTER — Encounter: Payer: Self-pay | Admitting: Physical Therapy

## 2022-04-12 DIAGNOSIS — M21372 Foot drop, left foot: Secondary | ICD-10-CM | POA: Diagnosis not present

## 2022-04-12 DIAGNOSIS — M6281 Muscle weakness (generalized): Secondary | ICD-10-CM | POA: Diagnosis not present

## 2022-04-12 DIAGNOSIS — R296 Repeated falls: Secondary | ICD-10-CM | POA: Diagnosis not present

## 2022-04-12 DIAGNOSIS — R2689 Other abnormalities of gait and mobility: Secondary | ICD-10-CM

## 2022-04-12 DIAGNOSIS — M21371 Foot drop, right foot: Secondary | ICD-10-CM | POA: Diagnosis not present

## 2022-04-12 DIAGNOSIS — R2681 Unsteadiness on feet: Secondary | ICD-10-CM | POA: Diagnosis not present

## 2022-04-12 DIAGNOSIS — R29818 Other symptoms and signs involving the nervous system: Secondary | ICD-10-CM

## 2022-04-12 NOTE — Therapy (Signed)
OUTPATIENT PHYSICAL THERAPY NEURO TREATMENT   Patient Name: Johnathan Ross MRN: [id number] DOB:02-May-1971, 51 y.o., male Today's Date: 04/12/2022   PCP: Janith Lima, MD REFERRING PROVIDER: Britt Bottom, MD    PT End of Session - 04/12/22 1935     Visit Number 6    Number of Visits 17    Date for PT Re-Evaluation 05/18/22    Authorization Type [employer]    Authorization Time Period 03-19-22 - 05-27-22    Authorization - Number of Visits 16    PT Start Time 0845    PT Stop Time 0930    PT Time Calculation (min) 45 min    Equipment Utilized During Treatment Other (comment)   Sitfit, kay bench, [place]all   Activity Tolerance Patient tolerated treatment well    Behavior During Therapy [facility] for tasks assessed/performed                Past Medical History:  Diagnosis Date   Hyperlipidemia    Hypertension    Hypogonadism in male 11/01/2016   Hypothyroidism    Multiple sclerosis ([place])    Neuromuscular disorder ([place])    Proliferative diabetic retinopathy(362.02)    Type 1 diabetes mellitus ([place]) dx'd 1994   Vitamin D insufficiency 11/29/2016   Past Surgical History:  Procedure Laterality Date   EYE SURGERY Bilateral    "laser for diabetic retinopathy"   FRACTURE SURGERY     IR VENO/EXT/UNI LEFT  03/24/2020   OPEN REDUCTION INTERNAL FIXATION (ORIF) TIBIA/FIBULA FRACTURE Right 10/30/2016   ORIF ANKLE FRACTURE Left 2015   ORIF TIBIA FRACTURE Right 10/30/2016   Procedure: OPEN REDUCTION INTERNAL FIXATION (ORIF) TIBIA FIBULA FRACTURE;  Surgeon: Altamese [place], MD;  Location: [place];  Service: Orthopedics;  Laterality: Right;   RETINAL LASER PROCEDURE Bilateral    "for diabetic retinopathy"   Patient Active Problem List   Diagnosis Date Noted   PHN (postherpetic neuralgia) 03/12/2022   Acute deep vein thrombosis (DVT) of axillary vein of left upper extremity ([place]) 03/12/2022   Colon cancer screening 08/01/2021   Clinical diagnosis of COVID-19 12/28/2020    OAB (overactive bladder) 09/21/2020   Conjunctival injection, right 09/21/2020   Essential hypertension 09/21/2020   Urinary dysfunction 09/21/2020   Thoracic outlet syndrome 05/03/2020   Controlled type 1 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 ([place]) 03/24/2020   Right epiretinal membrane 03/24/2020   Retinal exudates and deposits 03/24/2020   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   Nuclear sclerotic cataract of both eyes 03/24/2020   Gait disorder 03/23/2020   Benign prostatic hyperplasia without lower urinary tract symptoms 03/01/2020   Diabetic gastroparesis associated with type 1 diabetes mellitus ([place]) 03/01/2020   Uncontrolled type 1 diabetes mellitus with hyperglycemia ([place]) 03/01/2020   Gait disturbance 08/19/2019   Acquired diplegia ([place]) 08/19/2019   Vitamin D insufficiency 11/29/2016   Hypogonadism in male 11/01/2016   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[place])    Hypothyroidism    Hypertension    Abnormal liver function tests 03/06/2013   Type I (juvenile type) diabetes mellitus without mention of complication, uncontrolled 12/03/2007   HYPERCHOLESTEROLEMIA 11/12/2007   MULTIPLE SCLEROSIS 08/29/2007   PROLIFERATIVE DIABETIC RETINOPATHY 08/29/2007    ONSET DATE: Referral date 02-07-22  REFERRING DIAG: G35 (ICD-10-CM) - Multiple sclerosis ([place]) R26.9 (ICD-10-CM) - Gait disturbance   THERAPY DIAG:  Other abnormalities of gait and mobility  Muscle weakness (generalized)  Other symptoms and signs involving the nervous system  Rationale  for Evaluation and Treatment Rehabilitation  SUBJECTIVE:                                                                                                                                                                                             SUBJECTIVE STATEMENT: Pt reports he is able to advance his left leg easier when his Rt knee is flexed - wonders why is this the case? States  he thinks it would be harder because he could lift higher for more clearance when his Rt knee is extended.  Informed pt that when Rt knee is flexed the tone is not as strong or intense and this allows him to advance his left leg easier, when tone is reduced  Pt accompanied by: self  PERTINENT HISTORY: vitamin D insufficiency, Type 1 IDDM w/ polyneuropathy, diabetic retinopathy, hypothyroidism, HTN, hypercholestrolemia, h/o Rt tibia fracture, h/o Lt ankle fracture with ORIF, MS with acquired diplegia   PAIN: pain from shingles on Rt side - in posterior shoulder area  Are you having pain? No  PRECAUTIONS: Fall  WEIGHT BEARING RESTRICTIONS: No  FALLS: Has patient fallen in last 6 months? No  LIVING ENVIRONMENT: Lives with:  lives with their partner (girlfriend) Lives in: House/apartment Stairs: Yes; External: 6 steps; on right going up, on left going up, and can reach both Has following equipment at home: Gilford Rile - 2 wheeled, Wheelchair (manual), and shower chair  PLOF: Independent with basic ADLs, Independent with household mobility with device, Independent with transfers, and Needs assistance with homemaking  PATIENT GOALS: try to increase strength in lifting each leg up from hip in seated position (hip flexors); also increase strength in lifting Lt toes off floor (dorsiflexors)   OBJECTIVE:    TODAY'S TREATMENT:   04-12-22  GAIT: Gait pattern: pt uses vaulting gait pattern in which he lifts himself up and swings bil.LE's (paraplegic gait pattern) for increased speed, decreased step length- Left, decreased hip/knee flexion- Right, decreased hip/knee flexion- Left, decreased ankle dorsiflexion- Left, and poor foot clearance- Left Distance walked: various clinic distances into/out of clinic and within gym Assistive device utilized: Walker - 2 wheeled; pt is wearing his AFO on his RLE Level of assistance: SBA Comments:  Pt ambulates into gym from lobby requiring increased time w/  frequent stops due to spasticity; uses swing thru gait pattern for increased speed; uses reciprocal gait pattern when no one is behind him and when not in a hurry   NeuroRe-ed:     Pt performed quadruped position - lifting opposite UE/LE as able with attempted 3-5 sec hold; stretching low back by performing child's pose in quadruped  position  TherEx:   Performed bridging with LE's on red ball - initially with knees extended and then with knees flexed due to pt reporting increased spasticity with knees extended Bridging x 10 reps with knees flexed  Long sitting position for bil. Hamstring stretching 30 sec hold x 2 reps  Seated knee flexion with red theraband 10 reps each leg Seated hip adduction with red theraband 10 reps each leg Passive quad stretch bil. LE's with pt in prone position approx. 10 reps each leg  Access Code: [email or web address] URL: [email or web address] Date: 04/12/2022 Prepared by: Ethelene Browns  Exercises - Seated Hamstring Curl with Anchored Resistance  - 1 x daily - 7 x weekly - 1 sets - 10 reps - AND SEATED HIP ADDUCTION  - 1 x daily - 7 x weekly - 1 sets - 10 reps - 3-5 hold - Long Sitting Hamstring Stretch  - 1 x daily - 7 x weekly - 1 sets - 1-2 reps - 30-60 secs hold  PATIENT EDUCATION: Education details:  Medbridge [id number]; pt given red theraband for knee flexion and hip adduction Person educated: Patient Education method: Explanation Education comprehension: verbalized understanding  HOME EXERCISE PROGRAM: [id number] (code from episode prior)   GOALS: Goals reviewed with patient? Yes  SHORT TERM GOALS: Target date: 04/20/2022  Improve 5x sit to stand to </= 14 secs from standard chair with bil. UE support. Baseline:  16.97 secs Goal status: INITIAL  2.  Pt will stand unsupported and reach 10" outside BOS with 1 UE support for increased independence with ADL's in standing. Baseline:  Goal status: INITIAL  3.  Improve gait speed to >/= .7  ft/sec with RW to demo improved gait efficiency. Baseline: .35 ft/sec with RW Goal status: INITIAL  4.  Improve TUG score to </= 50 secs with RW to demo increased functional mobility. Baseline: 57.85 secs with RW Goal status: INITIAL  5.  Amb. 115' with RW on flat, even surface without requiring seated rest period.  Baseline: 53' with RW Goal status: INITIAL  6.  Independent in HEP for stretching & strengthening. Baseline:  Goal status: INITIAL  LONG TERM GOALS: Target date: 05/18/2022  Improve 5x sit to stand to </= 12 secs from standard chair with bil. UE support. Baseline:  16.97 secs Goal status: INITIAL  2.   Pt will stand unsupported and reach 6" outside BOS without UE support for increased independence with ADL's in standing. Baseline:  Goal status: INITIAL  3.  Improve gait speed to >/= 1.0 ft/sec with RW to demo improved gait efficiency. Baseline: .35 ft/sec with RW Goal status: INITIAL  4.   Improve TUG score to </= 44 secs with RW to demo increased functional mobility. Baseline: 57.85 secs with RW Goal status: INITIAL  5.  Amb. 200' with RW on flat, even surface without requiring seated rest period.  Baseline: 76' with RW Goal status: INITIAL  6.  Independent in updated HEP for stretching & strengthening. Baseline:  Goal status: INITIAL ASSESSMENT:  CLINICAL IMPRESSION: PT session focused on strengthening hip extensors, adductors and hamstrings and stretching exercises for quads and hamstrings.  Pt able to sit in long sitting position with bil. UE support needed to maintain balance and provide trunk support.  Pt able to perform bridge exercise with knees flexed to reduce tone with feet on red physioball.  Pt able to perform knee flexion and hip adduction with use of red theraband for resistance.  Cont with POC.  OBJECTIVE IMPAIRMENTS: Abnormal  gait, decreased activity tolerance, decreased balance, decreased endurance, difficulty walking, decreased ROM,  decreased strength, increased muscle spasms, and impaired tone.   ACTIVITY LIMITATIONS: carrying, bending, standing, squatting, stairs, transfers, and locomotion level  PARTICIPATION LIMITATIONS: meal prep, cleaning, laundry, driving, shopping, community activity, and yard work  PERSONAL FACTORS: Past/current experiences, Time since onset of injury/illness/exacerbation, and progressive nature of MS disease process  are also affecting patient's functional outcome.   REHAB POTENTIAL: Fair due to chronicity of deficits and progressive nature of MS disease process  CLINICAL DECISION MAKING: Stable/uncomplicated  EVALUATION COMPLEXITY: Low  PLAN:  PT FREQUENCY: 2x/week  PT DURATION: 8 weeks  PLANNED INTERVENTIONS: Therapeutic exercises, Therapeutic activity, Neuromuscular re-education, Balance training, Gait training, Patient/Family education, Self Care, Stair training, Orthotic/Fit training, and DME instructions  PLAN FOR NEXT SESSION: core strength in long-sitting; work on hip flexor and Lt DF strengthening; SciFit, standing balance, 10# slam ball in tall kneeling   Marlowe Cinquemani, [place], PT 04/12/2022, 7:44 PM

## 2022-04-17 ENCOUNTER — Ambulatory Visit: Payer: Medicare PPO | Admitting: Physical Therapy

## 2022-04-17 DIAGNOSIS — M21372 Foot drop, left foot: Secondary | ICD-10-CM | POA: Diagnosis not present

## 2022-04-17 DIAGNOSIS — R2689 Other abnormalities of gait and mobility: Secondary | ICD-10-CM | POA: Diagnosis not present

## 2022-04-17 DIAGNOSIS — R2681 Unsteadiness on feet: Secondary | ICD-10-CM

## 2022-04-17 DIAGNOSIS — M21371 Foot drop, right foot: Secondary | ICD-10-CM | POA: Diagnosis not present

## 2022-04-17 DIAGNOSIS — R29818 Other symptoms and signs involving the nervous system: Secondary | ICD-10-CM | POA: Diagnosis not present

## 2022-04-17 DIAGNOSIS — R296 Repeated falls: Secondary | ICD-10-CM | POA: Diagnosis not present

## 2022-04-17 DIAGNOSIS — M6281 Muscle weakness (generalized): Secondary | ICD-10-CM

## 2022-04-17 NOTE — Therapy (Signed)
OUTPATIENT PHYSICAL THERAPY NEURO TREATMENT   Patient Name: Johnathan Ross MRN: [id number] DOB:1970-10-03, 51 y.o., male Today's Date: 04/18/2022   PCP: Janith Lima, MD REFERRING PROVIDER: Britt Bottom, MD    PT End of Session - 04/18/22 1742     Visit Number 7    Number of Visits 17    Date for PT Re-Evaluation 05/18/22    Authorization Type [employer]    Authorization Time Period 03-19-22 - 05-27-22    Authorization - Visit Number 7    Authorization - Number of Visits 16    PT Start Time 0845    PT Stop Time 0930    PT Time Calculation (min) 45 min    Equipment Utilized During Treatment Other (comment)   10# weighted ball, SitFit   Activity Tolerance Patient tolerated treatment well    Behavior During Therapy [facility] for tasks assessed/performed                 Past Medical History:  Diagnosis Date   Hyperlipidemia    Hypertension    Hypogonadism in male 11/01/2016   Hypothyroidism    Multiple sclerosis ([place])    Neuromuscular disorder ([place])    Proliferative diabetic retinopathy(362.02)    Type 1 diabetes mellitus ([place]) dx'd 1994   Vitamin D insufficiency 11/29/2016   Past Surgical History:  Procedure Laterality Date   EYE SURGERY Bilateral    "laser for diabetic retinopathy"   FRACTURE SURGERY     IR VENO/EXT/UNI LEFT  03/24/2020   OPEN REDUCTION INTERNAL FIXATION (ORIF) TIBIA/FIBULA FRACTURE Right 10/30/2016   ORIF ANKLE FRACTURE Left 2015   ORIF TIBIA FRACTURE Right 10/30/2016   Procedure: OPEN REDUCTION INTERNAL FIXATION (ORIF) TIBIA FIBULA FRACTURE;  Surgeon: Altamese [place], MD;  Location: [place];  Service: Orthopedics;  Laterality: Right;   RETINAL LASER PROCEDURE Bilateral    "for diabetic retinopathy"   Patient Active Problem List   Diagnosis Date Noted   PHN (postherpetic neuralgia) 03/12/2022   Acute deep vein thrombosis (DVT) of axillary vein of left upper extremity ([place]) 03/12/2022   Colon cancer screening 08/01/2021   Clinical  diagnosis of COVID-19 12/28/2020   OAB (overactive bladder) 09/21/2020   Conjunctival injection, right 09/21/2020   Essential hypertension 09/21/2020   Urinary dysfunction 09/21/2020   Thoracic outlet syndrome 05/03/2020   Controlled type 1 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 ([place]) 03/24/2020   Right epiretinal membrane 03/24/2020   Retinal exudates and deposits 03/24/2020   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   Nuclear sclerotic cataract of both eyes 03/24/2020   Gait disorder 03/23/2020   Benign prostatic hyperplasia without lower urinary tract symptoms 03/01/2020   Diabetic gastroparesis associated with type 1 diabetes mellitus ([place]) 03/01/2020   Uncontrolled type 1 diabetes mellitus with hyperglycemia ([place]) 03/01/2020   Gait disturbance 08/19/2019   Acquired diplegia ([place]) 08/19/2019   Vitamin D insufficiency 11/29/2016   Hypogonadism in male 11/01/2016   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[place])    Hypothyroidism    Hypertension    Abnormal liver function tests 03/06/2013   Type I (juvenile type) diabetes mellitus without mention of complication, uncontrolled 12/03/2007   HYPERCHOLESTEROLEMIA 11/12/2007   MULTIPLE SCLEROSIS 08/29/2007   PROLIFERATIVE DIABETIC RETINOPATHY 08/29/2007    ONSET DATE: Referral date 02-07-22  REFERRING DIAG: G35 (ICD-10-CM) - Multiple sclerosis ([place]) R26.9 (ICD-10-CM) - Gait disturbance   THERAPY DIAG:  Other abnormalities of gait and mobility  Muscle weakness (generalized)  Unsteadiness  on feet  Rationale for Evaluation and Treatment Rehabilitation  SUBJECTIVE:                                                                                                                                                                                             SUBJECTIVE STATEMENT: Pt reports no changes or problems since last week Pt accompanied by: self  PERTINENT HISTORY: vitamin D  insufficiency, Type 1 IDDM w/ polyneuropathy, diabetic retinopathy, hypothyroidism, HTN, hypercholestrolemia, h/o Rt tibia fracture, h/o Lt ankle fracture with ORIF, MS with acquired diplegia   PAIN:   Are you having pain? No  PRECAUTIONS: Fall  WEIGHT BEARING RESTRICTIONS: No  FALLS: Has patient fallen in last 6 months? No  LIVING ENVIRONMENT: Lives with:  lives with their partner (girlfriend) Lives in: House/apartment Stairs: Yes; External: 6 steps; on right going up, on left going up, and can reach both Has following equipment at home: Gilford Rile - 2 wheeled, Wheelchair (manual), and shower chair  PLOF: Independent with basic ADLs, Independent with household mobility with device, Independent with transfers, and Needs assistance with homemaking 115' PATIENT GOALS: try to increase strength in lifting each leg up from hip in seated position (hip flexors); also increase strength in lifting Lt toes off floor (dorsiflexors)   OBJECTIVE:    TODAY'S TREATMENT:   04-17-22  GAIT: Gait pattern: pt uses vaulting gait pattern in which he lifts himself up and swings bil.LE's (paraplegic gait pattern) for increased speed, decreased step length- Left, decreased hip/knee flexion- Right, decreased hip/knee flexion- Left, decreased ankle dorsiflexion- Left, and poor foot clearance- Left Distance walked: 115' (for STG assessment) Assistive device utilized: Environmental consultant - 2 wheeled; pt is wearing his AFO on his RLE Level of assistance: SBA Comments: Several occurrences of Lt toes catching in swing phase of gait due to increased spasticity; pt using reciprocal gait pattern  Pt ambulates into gym from lobby requiring increased time w/ frequent stops due to spasticity; uses swing thru gait pattern for increased speed; uses reciprocal gait pattern when no one is behind him and when not in a hurry   NeuroRe-ed:     Pt stood without UE support on RW and reached 10" anteriorly without UE support on RW  TUG  score 1" 16 secs with RW (performed after amb. 115' ) so score may be impacted by fatigue  TherEx:    5 times sit to stand:  15.56 secs from standard chair with UE support  Pt performed quadruped position - lifting opposite UE/LE as able with attempted 3-5 sec hold, 3 reps each side:  stretching low back by performing child's pose  in quadruped position Pt sat on SitFIt - performed knee extension/flexion 5 reps each leg; then hip flexion 3 reps each leg with UE support on mat prn   Access Code: [email or web address] URL: [email or web address] Date: 04/12/2022 Prepared by: Ethelene Browns  Exercises - Seated Hamstring Curl with Anchored Resistance  - 1 x daily - 7 x weekly - 1 sets - 10 reps - AND SEATED HIP ADDUCTION  - 1 x daily - 7 x weekly - 1 sets - 10 reps - 3-5 hold - Long Sitting Hamstring Stretch  - 1 x daily - 7 x weekly - 1 sets - 1-2 reps - 30-60 secs hold  PATIENT EDUCATION: Education details:  Medbridge [id number]; pt given red theraband for knee flexion and hip adduction Person educated: Patient Education method: Explanation Education comprehension: verbalized understanding  HOME EXERCISE PROGRAM: [id number] (code from episode prior)   GOALS: Goals reviewed with patient? Yes  SHORT TERM GOALS: Target date: 04/20/2022  Improve 5x sit to stand to </= 14 secs from standard chair with bil. UE support. Baseline:  16.97 secs; 15.56 secs Goal status: Partially met - 04-17-22  2.  Pt will stand unsupported and reach 10" outside BOS with 1 UE support for increased independence with ADL's in standing. Baseline:  Goal status: Goal met - 04-17-22  3.  Improve gait speed to >/= .7 ft/sec with RW to demo improved gait efficiency. Baseline: .35 ft/sec with RW Goal status: INITIAL  4.  Improve TUG score to </= 50 secs with RW to demo increased functional mobility. Baseline: 57.85 secs with RW; 1'16".75 Goal status: Goal not met  5.  Amb. 115' with RW on flat, even surface  without requiring seated rest period.  Baseline: 48' with RW Goal status: Goal met 04-17-22  6.  Independent in HEP for stretching & strengthening. Baseline:  Goal status: Goal met 04-17-22  LONG TERM GOALS: Target date: 05/18/2022  Improve 5x sit to stand to </= 12 secs from standard chair with bil. UE support. Baseline:  16.97 secs Goal status: INITIAL  2.   Pt will stand unsupported and reach 6" outside BOS without UE support for increased independence with ADL's in standing. Baseline:  Goal status: INITIAL  3.  Improve gait speed to >/= 1.0 ft/sec with RW to demo improved gait efficiency. Baseline: .35 ft/sec with RW Goal status: INITIAL  4.   Improve TUG score to </= 44 secs with RW to demo increased functional mobility. Baseline: 57.85 secs with RW Goal status: INITIAL  5.  Amb. 200' with RW on flat, even surface without requiring seated rest period.  Baseline: 32' with RW Goal status: INITIAL  6.  Independent in updated HEP for stretching & strengthening. Baseline:  Goal status: INITIAL ASSESSMENT:  CLINICAL IMPRESSION: PT session focused on STG assessment:  pt has met STG's #2, 5 & 6:  STG #1 partially met as 5x sit to stand score has improved from 16.97 secs to 15.56 secs but not to stated goal.  STG #3 not assessed due to fatigue and also due to time constraint - will assess next session.  Pt continues to have moderate extensor tone/spasticity which impacts mobility and ease with movement.  Cont with POC.  OBJECTIVE IMPAIRMENTS: Abnormal gait, decreased activity tolerance, decreased balance, decreased endurance, difficulty walking, decreased ROM, decreased strength, increased muscle spasms, and impaired tone.   ACTIVITY LIMITATIONS: carrying, bending, standing, squatting, stairs, transfers, and locomotion level  PARTICIPATION LIMITATIONS: meal prep, cleaning, laundry, driving, shopping, community activity,  and yard work  PERSONAL FACTORS: Past/current  experiences, Time since onset of injury/illness/exacerbation, and progressive nature of MS disease process  are also affecting patient's functional outcome.   REHAB POTENTIAL: Fair due to chronicity of deficits and progressive nature of MS disease process  CLINICAL DECISION MAKING: Stable/uncomplicated  EVALUATION COMPLEXITY: Low  PLAN:  PT FREQUENCY: 2x/week  PT DURATION: 8 weeks  PLANNED INTERVENTIONS: Therapeutic exercises, Therapeutic activity, Neuromuscular re-education, Balance training, Gait training, Patient/Family education, Self Care, Stair training, Orthotic/Fit training, and DME instructions  PLAN FOR NEXT SESSION: core strength in long-sitting; work on hip flexor and Lt DF strengthening; SciFit, standing balance, 10# slam ball in tall kneeling   Vanellope [place], [place], PT 04/18/2022, 5:46 PM

## 2022-04-18 ENCOUNTER — Encounter: Payer: Self-pay | Admitting: Physical Therapy

## 2022-04-23 ENCOUNTER — Ambulatory Visit: Payer: Medicare PPO | Admitting: Physical Therapy

## 2022-04-23 DIAGNOSIS — R29818 Other symptoms and signs involving the nervous system: Secondary | ICD-10-CM

## 2022-04-23 DIAGNOSIS — R296 Repeated falls: Secondary | ICD-10-CM | POA: Diagnosis not present

## 2022-04-23 DIAGNOSIS — R2681 Unsteadiness on feet: Secondary | ICD-10-CM | POA: Diagnosis not present

## 2022-04-23 DIAGNOSIS — M21372 Foot drop, left foot: Secondary | ICD-10-CM | POA: Diagnosis not present

## 2022-04-23 DIAGNOSIS — R2689 Other abnormalities of gait and mobility: Secondary | ICD-10-CM | POA: Diagnosis not present

## 2022-04-23 DIAGNOSIS — M21371 Foot drop, right foot: Secondary | ICD-10-CM | POA: Diagnosis not present

## 2022-04-23 DIAGNOSIS — M6281 Muscle weakness (generalized): Secondary | ICD-10-CM

## 2022-04-23 NOTE — Therapy (Unsigned)
OUTPATIENT PHYSICAL THERAPY NEURO TREATMENT   Patient Name: Johnathan Ross MRN: [id number] DOB:1971-05-05, 51 y.o., male Today's Date: 04/24/2022   PCP: Janith Lima, MD REFERRING PROVIDER: Britt Bottom, MD    PT End of Session - 04/24/22 1034     Visit Number 8    Number of Visits 17    Date for PT Re-Evaluation 05/18/22    Authorization Type [employer]    Authorization Time Period 03-19-22 - 05-27-22    Authorization - Number of Visits 16    PT Start Time 0850    PT Stop Time 0930    PT Time Calculation (min) 40 min    Equipment Utilized During Treatment Other (comment)   10# weighted ball, SitFit   Activity Tolerance Patient tolerated treatment well    Behavior During Therapy [facility] for tasks assessed/performed                  Past Medical History:  Diagnosis Date   Hyperlipidemia    Hypertension    Hypogonadism in male 11/01/2016   Hypothyroidism    Multiple sclerosis ([place])    Neuromuscular disorder ([place])    Proliferative diabetic retinopathy(362.02)    Type 1 diabetes mellitus ([place]) dx'd 1994   Vitamin D insufficiency 11/29/2016   Past Surgical History:  Procedure Laterality Date   EYE SURGERY Bilateral    "laser for diabetic retinopathy"   FRACTURE SURGERY     IR VENO/EXT/UNI LEFT  03/24/2020   OPEN REDUCTION INTERNAL FIXATION (ORIF) TIBIA/FIBULA FRACTURE Right 10/30/2016   ORIF ANKLE FRACTURE Left 2015   ORIF TIBIA FRACTURE Right 10/30/2016   Procedure: OPEN REDUCTION INTERNAL FIXATION (ORIF) TIBIA FIBULA FRACTURE;  Surgeon: Altamese Needville, MD;  Location: [place];  Service: Orthopedics;  Laterality: Right;   RETINAL LASER PROCEDURE Bilateral    "for diabetic retinopathy"   Patient Active Problem List   Diagnosis Date Noted   PHN (postherpetic neuralgia) 03/12/2022   Acute deep vein thrombosis (DVT) of axillary vein of left upper extremity ([place]) 03/12/2022   Colon cancer screening 08/01/2021   Clinical diagnosis of COVID-19 12/28/2020    OAB (overactive bladder) 09/21/2020   Conjunctival injection, right 09/21/2020   Essential hypertension 09/21/2020   Urinary dysfunction 09/21/2020   Thoracic outlet syndrome 05/03/2020   Controlled type 1 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 ([place]) 03/24/2020   Right epiretinal membrane 03/24/2020   Retinal exudates and deposits 03/24/2020   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   Nuclear sclerotic cataract of both eyes 03/24/2020   Gait disorder 03/23/2020   Benign prostatic hyperplasia without lower urinary tract symptoms 03/01/2020   Diabetic gastroparesis associated with type 1 diabetes mellitus ([place]) 03/01/2020   Uncontrolled type 1 diabetes mellitus with hyperglycemia ([place]) 03/01/2020   Gait disturbance 08/19/2019   Acquired diplegia ([place]) 08/19/2019   Vitamin D insufficiency 11/29/2016   Hypogonadism in male 11/01/2016   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[place])    Hypothyroidism    Hypertension    Abnormal liver function tests 03/06/2013   Type I (juvenile type) diabetes mellitus without mention of complication, uncontrolled 12/03/2007   HYPERCHOLESTEROLEMIA 11/12/2007   MULTIPLE SCLEROSIS 08/29/2007   PROLIFERATIVE DIABETIC RETINOPATHY 08/29/2007    ONSET DATE: Referral date 02-07-22  REFERRING DIAG: G35 (ICD-10-CM) - Multiple sclerosis ([place]) R26.9 (ICD-10-CM) - Gait disturbance   THERAPY DIAG:  Other abnormalities of gait and mobility  Muscle weakness (generalized)  Other symptoms and signs involving the nervous system  Rationale for Evaluation and Treatment Rehabilitation  SUBJECTIVE:                                                                                                                                                                                             SUBJECTIVE STATEMENT: Pt reports he got leaves up on Saturday; doing ok today - no changes or issues Pt accompanied by: self  PERTINENT  HISTORY: vitamin D insufficiency, Type 1 IDDM w/ polyneuropathy, diabetic retinopathy, hypothyroidism, HTN, hypercholestrolemia, h/o Rt tibia fracture, h/o Lt ankle fracture with ORIF, MS with acquired diplegia   PAIN:   Are you having pain? No  PRECAUTIONS: Fall  WEIGHT BEARING RESTRICTIONS: No  FALLS: Has patient fallen in last 6 months? No  LIVING ENVIRONMENT: Lives with:  lives with their partner (girlfriend) Lives in: House/apartment Stairs: Yes; External: 6 steps; on right going up, on left going up, and can reach both Has following equipment at home: Gilford Rile - 2 wheeled, Wheelchair (manual), and shower chair  PLOF: Independent with basic ADLs, Independent with household mobility with device, Independent with transfers, and Needs assistance with homemaking 115' PATIENT GOALS: try to increase strength in lifting each leg up from hip in seated position (hip flexors); also increase strength in lifting Lt toes off floor (dorsiflexors)   OBJECTIVE:    TODAY'S TREATMENT:   04-23-22  GAIT: Gait pattern: pt uses vaulting gait pattern in which he lifts himself up and swings bil.LE's (paraplegic gait pattern) for increased speed, decreased step length- Left, decreased hip/knee flexion- Right, decreased hip/knee flexion- Left, decreased ankle dorsiflexion- Left, and poor foot clearance- Left Distance walked: 115' (for STG assessment) Assistive device utilized: Environmental consultant - 2 wheeled; pt is wearing his AFO on his RLE Level of assistance: SBA Comments: Several occurrences of Lt toes catching in swing phase of gait due to increased spasticity; pt using reciprocal gait pattern  Pt ambulates into gym from lobby requiring increased time w/ frequent stops due to spasticity; uses swing thru gait pattern for increased speed; uses reciprocal gait pattern when no one is behind him and when not in a hurry  Gait velocity:  51.97 secs = .63 ft/sec  NeuroRe-ed:     5x sit to stand score 17.06  secs  TUG score 49 secs with RW Pt performed sitting on SitFit - lifted each UE 3 reps unilaterally and then bilaterally 5 reps; held 10# weighted ball - moved laterally side to side 3 reps and then diagonal pattern 3 reps  TherEx:    5 times sit to stand with UE support from high low mat table Pt performed quadruped position -  child's pose for low back stretching Pt sat on SitFIt - performed knee extension/flexion 5 reps each leg; then hip flexion 5 reps each leg with UE support on mat prn   Access Code: [email or web address] URL: [email or web address] Date: 04/12/2022 Prepared by: Ethelene Browns  Exercises - Seated Hamstring Curl with Anchored Resistance  - 1 x daily - 7 x weekly - 1 sets - 10 reps - AND SEATED HIP ADDUCTION  - 1 x daily - 7 x weekly - 1 sets - 10 reps - 3-5 hold - Long Sitting Hamstring Stretch  - 1 x daily - 7 x weekly - 1 sets - 1-2 reps - 30-60 secs hold  PATIENT EDUCATION: Education details:  Medbridge [id number]; pt given red theraband for knee flexion and hip adduction Person educated: Patient Education method: Explanation Education comprehension: verbalized understanding  HOME EXERCISE PROGRAM: [id number] (code from episode prior)   GOALS: Goals reviewed with patient? Yes  SHORT TERM GOALS: Target date: 04/20/2022  Improve 5x sit to stand to </= 14 secs from standard chair with bil. UE support. Baseline:  16.97 secs; 15.56 secs Goal status: Partially met - 04-17-22  2.  Pt will stand unsupported and reach 10" outside BOS with 1 UE support for increased independence with ADL's in standing. Baseline:  Goal status: Goal met - 04-17-22  3.  Improve gait speed to >/= .7 ft/sec with RW to demo improved gait efficiency. Baseline: .35 ft/sec with RW; .63 ft/sec = 51.97 secs -- 04-23-22 Goal status: Goal not met 04-23-22  4.  Improve TUG score to </= 50 secs with RW to demo increased functional mobility. Baseline: 57.85 secs with RW; 1'16".75;    04-23-22:  49.32 secs Goal status: Goal not met 04-17-22  5.  Amb. 115' with RW on flat, even surface without requiring seated rest period.  Baseline: 60' with RW Goal status: Goal met 04-17-22  6.  Independent in HEP for stretching & strengthening. Baseline:  Goal status: Goal met 04-17-22  LONG TERM GOALS: Target date: 05/18/2022  Improve 5x sit to stand to </= 12 secs from standard chair with bil. UE support. Baseline:  16.97 secs Goal status: INITIAL  2.   Pt will stand unsupported and reach 6" outside BOS without UE support for increased independence with ADL's in standing. Baseline:  Goal status: INITIAL  3.  Improve gait speed to >/= 1.0 ft/sec with RW to demo improved gait efficiency. Baseline: .35 ft/sec with RW Goal status: INITIAL  4.   Improve TUG score to </= 44 secs with RW to demo increased functional mobility. Baseline: 57.85 secs with RW Goal status: INITIAL  5.  Amb. 200' with RW on flat, even surface without requiring seated rest period.  Baseline: 74' with RW Goal status: INITIAL  6.  Independent in updated HEP for stretching & strengthening. Baseline:  Goal status: INITIAL ASSESSMENT:  CLINICAL IMPRESSION: PT session focused on reassessment of STG's #3 and 4 due pt fatigued in previous session when TUG goal was assessed at end of session.  STG #4 (TUG score) was met as score 49.32 secs; STG #3 was closely approximated as gait velocity .63 ft/sec (goal .7 ft/sec).  Spasticity/extensor tone continues to significantly impact mobility and ease of movement.  Cont with POC.  OBJECTIVE IMPAIRMENTS: Abnormal gait, decreased activity tolerance, decreased balance, decreased endurance, difficulty walking, decreased ROM, decreased strength, increased muscle spasms, and impaired tone.   ACTIVITY LIMITATIONS: carrying, bending, standing, squatting, stairs, transfers, and locomotion level  PARTICIPATION LIMITATIONS:  meal prep, cleaning, laundry, driving, shopping,  community activity, and yard work  PERSONAL FACTORS: Past/current experiences, Time since onset of injury/illness/exacerbation, and progressive nature of MS disease process  are also affecting patient's functional outcome.   REHAB POTENTIAL: Fair due to chronicity of deficits and progressive nature of MS disease process  CLINICAL DECISION MAKING: Stable/uncomplicated  EVALUATION COMPLEXITY: Low  PLAN:  PT FREQUENCY: 2x/week  PT DURATION: 8 weeks  PLANNED INTERVENTIONS: Therapeutic exercises, Therapeutic activity, Neuromuscular re-education, Balance training, Gait training, Patient/Family education, Self Care, Stair training, Orthotic/Fit training, and DME instructions  PLAN FOR NEXT SESSION: core strength in long-sitting; work on hip flexor and Lt DF strengthening; SciFit, standing balance, 10# slam ball in tall kneeling   [place], [place], PT 04/24/2022, 10:38 AM

## 2022-04-24 ENCOUNTER — Encounter: Payer: Self-pay | Admitting: Physical Therapy

## 2022-04-26 ENCOUNTER — Ambulatory Visit: Payer: Medicare PPO | Admitting: Physical Therapy

## 2022-04-26 ENCOUNTER — Encounter: Payer: Self-pay | Admitting: Physical Therapy

## 2022-04-26 DIAGNOSIS — R29818 Other symptoms and signs involving the nervous system: Secondary | ICD-10-CM

## 2022-04-26 DIAGNOSIS — R2689 Other abnormalities of gait and mobility: Secondary | ICD-10-CM | POA: Diagnosis not present

## 2022-04-26 DIAGNOSIS — M21372 Foot drop, left foot: Secondary | ICD-10-CM

## 2022-04-26 DIAGNOSIS — M21371 Foot drop, right foot: Secondary | ICD-10-CM

## 2022-04-26 DIAGNOSIS — M6281 Muscle weakness (generalized): Secondary | ICD-10-CM

## 2022-04-26 DIAGNOSIS — R296 Repeated falls: Secondary | ICD-10-CM | POA: Diagnosis not present

## 2022-04-26 DIAGNOSIS — R2681 Unsteadiness on feet: Secondary | ICD-10-CM

## 2022-04-26 NOTE — Therapy (Signed)
OUTPATIENT PHYSICAL THERAPY NEURO TREATMENT   Patient Name: Johnathan Ross MRN: [id number] DOB:11-Feb-1971, 51 y.o., male Today's Date: 04/26/2022   PCP: Janith Lima, MD REFERRING PROVIDER: Britt Bottom, MD    PT End of Session - 04/26/22 0850     Visit Number 9    Number of Visits 17    Date for PT Re-Evaluation 05/18/22    Authorization Type [employer]    Authorization Time Period 03-19-22 - 05-27-22    Authorization - Number of Visits 16    PT Start Time 0845    PT Stop Time 0929    PT Time Calculation (min) 44 min    Equipment Utilized During Treatment Other (comment)   10# weighted ball, SitFit   Activity Tolerance Patient tolerated treatment well    Behavior During Therapy [facility] for tasks assessed/performed                  Past Medical History:  Diagnosis Date   Hyperlipidemia    Hypertension    Hypogonadism in male 11/01/2016   Hypothyroidism    Multiple sclerosis ([place])    Neuromuscular disorder ([place])    Proliferative diabetic retinopathy(362.02)    Type 1 diabetes mellitus ([place]) dx'd 1994   Vitamin D insufficiency 11/29/2016   Past Surgical History:  Procedure Laterality Date   EYE SURGERY Bilateral    "laser for diabetic retinopathy"   FRACTURE SURGERY     IR VENO/EXT/UNI LEFT  03/24/2020   OPEN REDUCTION INTERNAL FIXATION (ORIF) TIBIA/FIBULA FRACTURE Right 10/30/2016   ORIF ANKLE FRACTURE Left 2015   ORIF TIBIA FRACTURE Right 10/30/2016   Procedure: OPEN REDUCTION INTERNAL FIXATION (ORIF) TIBIA FIBULA FRACTURE;  Surgeon: Altamese Staves, MD;  Location: [place];  Service: Orthopedics;  Laterality: Right;   RETINAL LASER PROCEDURE Bilateral    "for diabetic retinopathy"   Patient Active Problem List   Diagnosis Date Noted   PHN (postherpetic neuralgia) 03/12/2022   Acute deep vein thrombosis (DVT) of axillary vein of left upper extremity ([place]) 03/12/2022   Colon cancer screening 08/01/2021   Clinical diagnosis of COVID-19 12/28/2020    OAB (overactive bladder) 09/21/2020   Conjunctival injection, right 09/21/2020   Essential hypertension 09/21/2020   Urinary dysfunction 09/21/2020   Thoracic outlet syndrome 05/03/2020   Controlled type 1 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 ([place]) 03/24/2020   Right epiretinal membrane 03/24/2020   Retinal exudates and deposits 03/24/2020   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   Nuclear sclerotic cataract of both eyes 03/24/2020   Gait disorder 03/23/2020   Benign prostatic hyperplasia without lower urinary tract symptoms 03/01/2020   Diabetic gastroparesis associated with type 1 diabetes mellitus ([place]) 03/01/2020   Uncontrolled type 1 diabetes mellitus with hyperglycemia ([place]) 03/01/2020   Gait disturbance 08/19/2019   Acquired diplegia ([place]) 08/19/2019   Vitamin D insufficiency 11/29/2016   Hypogonadism in male 11/01/2016   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[place])    Hypothyroidism    Hypertension    Abnormal liver function tests 03/06/2013   Type I (juvenile type) diabetes mellitus without mention of complication, uncontrolled 12/03/2007   HYPERCHOLESTEROLEMIA 11/12/2007   MULTIPLE SCLEROSIS 08/29/2007   PROLIFERATIVE DIABETIC RETINOPATHY 08/29/2007    ONSET DATE: Referral date 02-07-22  REFERRING DIAG: G35 (ICD-10-CM) - Multiple sclerosis ([place]) R26.9 (ICD-10-CM) - Gait disturbance   THERAPY DIAG:  Other abnormalities of gait and mobility  Muscle weakness (generalized)  Repeated falls  Unsteadiness on feet  Other  symptoms and signs involving the nervous system  Foot drop, left  Foot drop, right  Rationale for Evaluation and Treatment Rehabilitation  SUBJECTIVE:                                                                                                                                                                                             SUBJECTIVE STATEMENT: Pt states he has had no changes or  other issues since last session. Pt accompanied by: self  PERTINENT HISTORY: vitamin D insufficiency, Type 1 IDDM w/ polyneuropathy, diabetic retinopathy, hypothyroidism, HTN, hypercholestrolemia, h/o Rt tibia fracture, h/o Lt ankle fracture with ORIF, MS with acquired diplegia   PAIN:   Are you having pain? No  PRECAUTIONS: Fall  WEIGHT BEARING RESTRICTIONS: No  FALLS: Has patient fallen in last 6 months? No  LIVING ENVIRONMENT: Lives with:  lives with their partner (girlfriend) Lives in: House/apartment Stairs: Yes; External: 6 steps; on right going up, on left going up, and can reach both Has following equipment at home: Gilford Rile - 2 wheeled, Wheelchair (manual), and shower chair  PLOF: Independent with basic ADLs, Independent with household mobility with device, Independent with transfers, and Needs assistance with homemaking 115' PATIENT GOALS: try to increase strength in lifting each leg up from hip in seated position (hip flexors); also increase strength in lifting Lt toes off floor (dorsiflexors)   OBJECTIVE:    TODAY'S TREATMENT:   04-23-22  GAIT: Gait pattern: pt uses vaulting gait pattern in which he lifts himself up and swings bil.LE's (paraplegic gait pattern) for increased speed, decreased step length- Left, decreased hip/knee flexion- Right, decreased hip/knee flexion- Left, decreased ankle dorsiflexion- Left, and poor foot clearance- Left Distance walked: 115' (for STG assessment) Assistive device utilized: Environmental consultant - 2 wheeled; pt is wearing his AFO on his RLE Level of assistance: SBA Comments: Several occurrences of Lt toes catching in swing phase of gait due to increased spasticity; pt using reciprocal gait pattern  Pt ambulates into gym from lobby requiring increased time w/ frequent stops due to spasticity; uses swing thru gait pattern for increased speed; uses reciprocal gait pattern when no one is behind him and when not in a hurry  -Tall kneeling to  side-sitting w/ 3.3 lb ball x20 alt laterality -[place]ann level 2, focused on short sets w/ long rest to prevent worsening of spasticity -Attempted SLR to bias hip flexor vs quad using long lever, focused on short reps and long rest b/w sets, right LE fatigues quicker than left -90-90 holds w/ active assist from therapist, pt cued to prevent holding breath, visibly using core -Seated piriformis stretch x71mn  each side, pt reports good stretch sensation, no pain -Trialed 2" step ups w/ BUE support using yellow theraband for augmented feedback to primarily hip flexors on right and hamstrings on left LE  PATIENT EDUCATION: Education details:  Discussed using prolonged rest, weight-bearing through LEs, and trialing alternating LE focused on when exercising to break tone cycle vs determining true muscle fatigue.   Person educated: Patient Education method: Explanation Education comprehension: verbalized understanding  HOME EXERCISE PROGRAM: [email or web address] (code from episode prior)   Access Code: [email or web address] URL: [email or web address] Date: 04/12/2022 Prepared by: Ethelene Browns  Exercises - Seated Hamstring Curl with Anchored Resistance  - 1 x daily - 7 x weekly - 1 sets - 10 reps - AND SEATED HIP ADDUCTION  - 1 x daily - 7 x weekly - 1 sets - 10 reps - 3-5 hold - Long Sitting Hamstring Stretch  - 1 x daily - 7 x weekly - 1 sets - 1-2 reps - 30-60 secs hold  GOALS: Goals reviewed with patient? Yes  SHORT TERM GOALS: Target date: 04/20/2022  Improve 5x sit to stand to </= 14 secs from standard chair with bil. UE support. Baseline:  16.97 secs; 15.56 secs Goal status: Partially met - 04-17-22  2.  Pt will stand unsupported and reach 10" outside BOS with 1 UE support for increased independence with ADL's in standing. Baseline:  Goal status: Goal met - 04-17-22  3.  Improve gait speed to >/= .7 ft/sec with RW to demo improved gait efficiency. Baseline: .35 ft/sec with RW; .63 ft/sec  = 51.97 secs -- 04-23-22 Goal status: Goal not met 04-23-22  4.  Improve TUG score to </= 50 secs with RW to demo increased functional mobility. Baseline: 57.85 secs with RW; 1'16".75;   04-23-22:  49.32 secs Goal status: Goal not met 04-17-22  5.  Amb. 115' with RW on flat, even surface without requiring seated rest period.  Baseline: 83' with RW Goal status: Goal met 04-17-22  6.  Independent in HEP for stretching & strengthening. Baseline:  Goal status: Goal met 04-17-22  LONG TERM GOALS: Target date: 05/18/2022  Improve 5x sit to stand to </= 12 secs from standard chair with bil. UE support. Baseline:  16.97 secs Goal status: INITIAL  2.   Pt will stand unsupported and reach 6" outside BOS without UE support for increased independence with ADL's in standing. Baseline:  Goal status: INITIAL  3.  Improve gait speed to >/= 1.0 ft/sec with RW to demo improved gait efficiency. Baseline: .35 ft/sec with RW Goal status: INITIAL  4.   Improve TUG score to </= 44 secs with RW to demo increased functional mobility. Baseline: 57.85 secs with RW Goal status: INITIAL  5.  Amb. 200' with RW on flat, even surface without requiring seated rest period.  Baseline: 90' with RW Goal status: INITIAL  6.  Independent in updated HEP for stretching & strengthening. Baseline:  Goal status: INITIAL ASSESSMENT:  CLINICAL IMPRESSION: Focus of skilled session today on addressing hamstring and hip flexor weakness.  He continues to be impacted by his LE spasticity during all exercises and ambulation.  He continues to benefit from skilled PT to promote functional and safe mobility as well as education with ongoing symptoms.  OBJECTIVE IMPAIRMENTS: Abnormal gait, decreased activity tolerance, decreased balance, decreased endurance, difficulty walking, decreased ROM, decreased strength, increased muscle spasms, and impaired tone.   ACTIVITY LIMITATIONS: carrying, bending, standing, squatting,  stairs, transfers, and locomotion level  PARTICIPATION LIMITATIONS: meal  prep, cleaning, laundry, driving, shopping, community activity, and yard work  PERSONAL FACTORS: Past/current experiences, Time since onset of injury/illness/exacerbation, and progressive nature of MS disease process  are also affecting patient's functional outcome.   REHAB POTENTIAL: Fair due to chronicity of deficits and progressive nature of MS disease process  CLINICAL DECISION MAKING: Stable/uncomplicated  EVALUATION COMPLEXITY: Low  PLAN:  PT FREQUENCY: 2x/week  PT DURATION: 8 weeks  PLANNED INTERVENTIONS: Therapeutic exercises, Therapeutic activity, Neuromuscular re-education, Balance training, Gait training, Patient/Family education, Self Care, Stair training, Orthotic/Fit training, and DME instructions  PLAN FOR NEXT SESSION: core strength in long-sitting; work on hip flexor and Lt DF strengthening; SciFit, standing balance, piriformis stretching- was tight this session   Bary Richard, PT, DPT 04/26/2022, 9:58 AM

## 2022-04-28 ENCOUNTER — Other Ambulatory Visit: Payer: Self-pay | Admitting: Endocrinology

## 2022-04-30 ENCOUNTER — Ambulatory Visit: Payer: Medicare PPO | Attending: Neurology | Admitting: Physical Therapy

## 2022-04-30 DIAGNOSIS — R2681 Unsteadiness on feet: Secondary | ICD-10-CM | POA: Insufficient documentation

## 2022-04-30 DIAGNOSIS — R296 Repeated falls: Secondary | ICD-10-CM | POA: Insufficient documentation

## 2022-04-30 DIAGNOSIS — M21371 Foot drop, right foot: Secondary | ICD-10-CM | POA: Diagnosis not present

## 2022-04-30 DIAGNOSIS — M21372 Foot drop, left foot: Secondary | ICD-10-CM | POA: Diagnosis not present

## 2022-04-30 DIAGNOSIS — M6281 Muscle weakness (generalized): Secondary | ICD-10-CM | POA: Diagnosis not present

## 2022-04-30 DIAGNOSIS — R2689 Other abnormalities of gait and mobility: Secondary | ICD-10-CM | POA: Insufficient documentation

## 2022-04-30 DIAGNOSIS — R29818 Other symptoms and signs involving the nervous system: Secondary | ICD-10-CM | POA: Diagnosis not present

## 2022-05-01 ENCOUNTER — Encounter: Payer: Self-pay | Admitting: Physical Therapy

## 2022-05-01 NOTE — Therapy (Signed)
OUTPATIENT PHYSICAL THERAPY NEURO TREATMENT/ 10th Visit Progress Note   Progress Note Reporting Period 04-02-22 to 04-30-22  See note below for Objective Data and Assessment of Progress/Goals.       Patient Name: Johnathan Ross MRN: [id number] DOB:Jul 12, 1970, 51 y.o., male Today's Date: 05/01/2022   PCP: Janith Lima, MD REFERRING PROVIDER: Britt Bottom, MD    PT End of Session - 05/01/22 1901     Visit Number 10    Number of Visits 17    Date for PT Re-Evaluation 05/18/22    Authorization Type [employer]    Authorization Time Period 03-19-22 - 05-27-22    Authorization - Visit Number 10    Authorization - Number of Visits 16    PT Start Time 1007    PT Stop Time 0930    PT Time Calculation (min) 40 min    Activity Tolerance Patient tolerated treatment well    Behavior During Therapy [facility] for tasks assessed/performed                   Past Medical History:  Diagnosis Date   Hyperlipidemia    Hypertension    Hypogonadism in male 11/01/2016   Hypothyroidism    Multiple sclerosis ([place])    Neuromuscular disorder ([place])    Proliferative diabetic retinopathy(362.02)    Type 1 diabetes mellitus ([place]) dx'd 1994   Vitamin D insufficiency 11/29/2016   Past Surgical History:  Procedure Laterality Date   EYE SURGERY Bilateral    "laser for diabetic retinopathy"   FRACTURE SURGERY     IR VENO/EXT/UNI LEFT  03/24/2020   OPEN REDUCTION INTERNAL FIXATION (ORIF) TIBIA/FIBULA FRACTURE Right 10/30/2016   ORIF ANKLE FRACTURE Left 2015   ORIF TIBIA FRACTURE Right 10/30/2016   Procedure: OPEN REDUCTION INTERNAL FIXATION (ORIF) TIBIA FIBULA FRACTURE;  Surgeon: Altamese [place], MD;  Location: [place];  Service: Orthopedics;  Laterality: Right;   RETINAL LASER PROCEDURE Bilateral    "for diabetic retinopathy"   Patient Active Problem List   Diagnosis Date Noted   PHN (postherpetic neuralgia) 03/12/2022   Acute deep vein thrombosis (DVT) of axillary vein of left  upper extremity ([place]) 03/12/2022   Colon cancer screening 08/01/2021   Clinical diagnosis of COVID-19 12/28/2020   OAB (overactive bladder) 09/21/2020   Conjunctival injection, right 09/21/2020   Essential hypertension 09/21/2020   Urinary dysfunction 09/21/2020   Thoracic outlet syndrome 05/03/2020   Controlled type 1 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 ([place]) 03/24/2020   Right epiretinal membrane 03/24/2020   Retinal exudates and deposits 03/24/2020   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   Nuclear sclerotic cataract of both eyes 03/24/2020   Gait disorder 03/23/2020   Benign prostatic hyperplasia without lower urinary tract symptoms 03/01/2020   Diabetic gastroparesis associated with type 1 diabetes mellitus ([place]) 03/01/2020   Uncontrolled type 1 diabetes mellitus with hyperglycemia ([place]) 03/01/2020   Gait disturbance 08/19/2019   Acquired diplegia ([place]) 08/19/2019   Vitamin D insufficiency 11/29/2016   Hypogonadism in male 11/01/2016   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[place])    Hypothyroidism    Hypertension    Abnormal liver function tests 03/06/2013   Type I (juvenile type) diabetes mellitus without mention of complication, uncontrolled 12/03/2007   HYPERCHOLESTEROLEMIA 11/12/2007   MULTIPLE SCLEROSIS 08/29/2007   PROLIFERATIVE DIABETIC RETINOPATHY 08/29/2007    ONSET DATE: Referral date 02-07-22  REFERRING DIAG: G35 (ICD-10-CM) - Multiple sclerosis ([place]) R26.9 (ICD-10-CM) - Gait disturbance  THERAPY DIAG:  Other abnormalities of gait and mobility  Muscle weakness (generalized)  Unsteadiness on feet  Other symptoms and signs involving the nervous system  Rationale for Evaluation and Treatment Rehabilitation  SUBJECTIVE:                                                                                                                                                                                              SUBJECTIVE STATEMENT: Pt states he has had no changes or other issues since last session. Pt accompanied by: self  PERTINENT HISTORY: vitamin D insufficiency, Type 1 IDDM w/ polyneuropathy, diabetic retinopathy, hypothyroidism, HTN, hypercholestrolemia, h/o Rt tibia fracture, h/o Lt ankle fracture with ORIF, MS with acquired diplegia   PAIN:   Are you having pain? No  PRECAUTIONS: Fall  WEIGHT BEARING RESTRICTIONS: No  FALLS: Has patient fallen in last 6 months? No  LIVING ENVIRONMENT: Lives with:  lives with their partner (girlfriend) Lives in: House/apartment Stairs: Yes; External: 6 steps; on right going up, on left going up, and can reach both Has following equipment at home: Gilford Rile - 2 wheeled, Wheelchair (manual), and shower chair  PLOF: Independent with basic ADLs, Independent with household mobility with device, Independent with transfers, and Needs assistance with homemaking 115' PATIENT GOALS: try to increase strength in lifting each leg up from hip in seated position (hip flexors); also increase strength in lifting Lt toes off floor (dorsiflexors)   OBJECTIVE:    TODAY'S TREATMENT:   04-30-22  GAIT: Gait pattern: pt uses vaulting gait pattern in which he lifts himself up and swings bil.LE's (paraplegic gait pattern) for increased speed, decreased step length- Left, decreased hip/knee flexion- Right, decreased hip/knee flexion- Left, decreased ankle dorsiflexion- Left, and poor foot clearance- Left Distance walked: 115' (for STG assessment) Assistive device utilized: Environmental consultant - 2 wheeled; pt is wearing his AFO on his RLE Level of assistance: SBA Comments: Several occurrences of Lt toes catching in swing phase of gait due to increased spasticity; pt using reciprocal gait pattern  Pt ambulates into gym from lobby requiring increased time w/ frequent stops due to spasticity; uses swing thru gait pattern for increased speed; uses reciprocal gait pattern when no one is behind  him and when not in a hurry   THEREX:   Prone knee flexion/quad stretching AAROM 10 reps each leg; cues for eccentric hamstring control as able   Hip extension with knee flexed at 90 degrees - pt in prone position 10 reps each leg with min assist to maintain knee flexion at 90   Pt performed knee extension control exercise in standing with red theraband -  10 reps each position - bilateral stance, forward & backward stance 10 reps RLE & LLE    NEURORE-ED:  Pt performed quadruped position - child's pose for low back stretching and then performed contralateral UE flexion/LE extension with min assist 3 reps each side  Hip extension 5 reps each side in quadruped position with min to mod assist for increased ROM  Pt performed hip flexion in standing with use of 4" step placed on outside of // bars - pt used // bar and RW on other side for UE support; pt needed mod to min assist to lift LLE onto  Step    PATIENT EDUCATION: Education details:  Discussed using prolonged rest, weight-bearing through LEs, and trialing alternating LE focused on when exercising to break tone cycle vs determining true muscle fatigue.   Person educated: Patient Education method: Explanation Education comprehension: verbalized understanding  HOME EXERCISE PROGRAM: [email or web address] (code from episode prior)   Access Code: [email or web address] URL: [email or web address] Date: 04/12/2022 Prepared by: Ethelene Browns  Exercises - Seated Hamstring Curl with Anchored Resistance  - 1 x daily - 7 x weekly - 1 sets - 10 reps - AND SEATED HIP ADDUCTION  - 1 x daily - 7 x weekly - 1 sets - 10 reps - 3-5 hold - Long Sitting Hamstring Stretch  - 1 x daily - 7 x weekly - 1 sets - 1-2 reps - 30-60 secs hold  GOALS: Goals reviewed with patient? Yes  SHORT TERM GOALS: Target date: 04/20/2022  Improve 5x sit to stand to </= 14 secs from standard chair with bil. UE support. Baseline:  16.97 secs; 15.56 secs Goal status: Partially met -  04-17-22  2.  Pt will stand unsupported and reach 10" outside BOS with 1 UE support for increased independence with ADL's in standing. Baseline:  Goal status: Goal met - 04-17-22  3.  Improve gait speed to >/= .7 ft/sec with RW to demo improved gait efficiency. Baseline: .35 ft/sec with RW; .63 ft/sec = 51.97 secs -- 04-23-22 Goal status: Goal not met 04-23-22  4.  Improve TUG score to </= 50 secs with RW to demo increased functional mobility. Baseline: 57.85 secs with RW; 1'16".75;   04-23-22:  49.32 secs Goal status: Goal not met 04-17-22  5.  Amb. 115' with RW on flat, even surface without requiring seated rest period.  Baseline: 21' with RW Goal status: Goal met 04-17-22  6.  Independent in HEP for stretching & strengthening. Baseline:  Goal status: Goal met 04-17-22  LONG TERM GOALS: Target date: 05/18/2022  Improve 5x sit to stand to </= 12 secs from standard chair with bil. UE support. Baseline:  16.97 secs Goal status: INITIAL  2.   Pt will stand unsupported and reach 6" outside BOS without UE support for increased independence with ADL's in standing. Baseline:  Goal status: INITIAL  3.  Improve gait speed to >/= 1.0 ft/sec with RW to demo improved gait efficiency. Baseline: .35 ft/sec with RW Goal status: INITIAL  4.   Improve TUG score to </= 44 secs with RW to demo increased functional mobility. Baseline: 57.85 secs with RW Goal status: INITIAL  5.  Amb. 200' with RW on flat, even surface without requiring seated rest period.  Baseline: 39' with RW Goal status: INITIAL  6.  Independent in updated HEP for stretching & strengthening. Baseline:  Goal status: INITIAL ASSESSMENT:  CLINICAL IMPRESSION: Focus of skilled session today on addressing hamstring and hip flexor weakness.  He  continues to be impacted by his LE spasticity during all exercises and ambulation.  He continues to benefit from skilled PT to promote functional and safe mobility as well as  education with ongoing symptoms.  OBJECTIVE IMPAIRMENTS: Abnormal gait, decreased activity tolerance, decreased balance, decreased endurance, difficulty walking, decreased ROM, decreased strength, increased muscle spasms, and impaired tone.   ACTIVITY LIMITATIONS: carrying, bending, standing, squatting, stairs, transfers, and locomotion level  PARTICIPATION LIMITATIONS: meal prep, cleaning, laundry, driving, shopping, community activity, and yard work  PERSONAL FACTORS: Past/current experiences, Time since onset of injury/illness/exacerbation, and progressive nature of MS disease process  are also affecting patient's functional outcome.   REHAB POTENTIAL: Fair due to chronicity of deficits and progressive nature of MS disease process  CLINICAL DECISION MAKING: Stable/uncomplicated  EVALUATION COMPLEXITY: Low  PLAN:  PT FREQUENCY: 2x/week  PT DURATION: 8 weeks  PLANNED INTERVENTIONS: Therapeutic exercises, Therapeutic activity, Neuromuscular re-education, Balance training, Gait training, Patient/Family education, Self Care, Stair training, Orthotic/Fit training, and DME instructions  PLAN FOR NEXT SESSION: core strength in long-sitting; work on hip flexor and Lt DF strengthening; SciFit, standing balance   Tacarra Justo, [place], PT 05/01/2022, 7:11 PM

## 2022-05-02 DIAGNOSIS — E1065 Type 1 diabetes mellitus with hyperglycemia: Secondary | ICD-10-CM | POA: Diagnosis not present

## 2022-05-03 ENCOUNTER — Ambulatory Visit: Payer: Medicare PPO | Admitting: Physical Therapy

## 2022-05-03 ENCOUNTER — Encounter: Payer: Self-pay | Admitting: Physical Therapy

## 2022-05-03 DIAGNOSIS — M6281 Muscle weakness (generalized): Secondary | ICD-10-CM | POA: Diagnosis not present

## 2022-05-03 DIAGNOSIS — R2689 Other abnormalities of gait and mobility: Secondary | ICD-10-CM | POA: Diagnosis not present

## 2022-05-03 DIAGNOSIS — R2681 Unsteadiness on feet: Secondary | ICD-10-CM

## 2022-05-03 DIAGNOSIS — M21371 Foot drop, right foot: Secondary | ICD-10-CM | POA: Diagnosis not present

## 2022-05-03 DIAGNOSIS — R296 Repeated falls: Secondary | ICD-10-CM | POA: Diagnosis not present

## 2022-05-03 DIAGNOSIS — M21372 Foot drop, left foot: Secondary | ICD-10-CM | POA: Diagnosis not present

## 2022-05-03 DIAGNOSIS — R29818 Other symptoms and signs involving the nervous system: Secondary | ICD-10-CM | POA: Diagnosis not present

## 2022-05-03 NOTE — Therapy (Signed)
OUTPATIENT PHYSICAL THERAPY NEURO TREATMENT     Patient Name: Johnathan Ross MRN: [id number] DOB:[birth year], 51 y.o., male Today's Date: 05/03/2022   PCP: Janith Lima, MD REFERRING PROVIDER: Britt Bottom, MD    PT End of Session - 05/03/22 1240     Visit Number 11    Number of Visits 17    Date for PT Re-Evaluation 05/18/22    Authorization Type [employer]    Authorization Time Period 03-19-22 - 05-27-22    Authorization - Visit Number 11    Authorization - Number of Visits 16    PT Start Time 0850    PT Stop Time 0935    PT Time Calculation (min) 45 min    Equipment Utilized During Treatment Other (comment)   kay bench   Activity Tolerance Patient tolerated treatment well    Behavior During Therapy [facility] for tasks assessed/performed                    Past Medical History:  Diagnosis Date   Hyperlipidemia    Hypertension    Hypogonadism in male 11/01/2016   Hypothyroidism    Multiple sclerosis ([place])    Neuromuscular disorder ([place])    Proliferative diabetic retinopathy(362.02)    Type 1 diabetes mellitus ([place]) dx'd 1994   Vitamin D insufficiency 11/29/2016   Past Surgical History:  Procedure Laterality Date   EYE SURGERY Bilateral    "laser for diabetic retinopathy"   FRACTURE SURGERY     IR VENO/EXT/UNI LEFT  03/24/2020   OPEN REDUCTION INTERNAL FIXATION (ORIF) TIBIA/FIBULA FRACTURE Right 10/30/2016   ORIF ANKLE FRACTURE Left 2015   ORIF TIBIA FRACTURE Right 10/30/2016   Procedure: OPEN REDUCTION INTERNAL FIXATION (ORIF) TIBIA FIBULA FRACTURE;  Surgeon: Altamese [place], MD;  Location: [place];  Service: Orthopedics;  Laterality: Right;   RETINAL LASER PROCEDURE Bilateral    "for diabetic retinopathy"   Patient Active Problem List   Diagnosis Date Noted   PHN (postherpetic neuralgia) 03/12/2022   Acute deep vein thrombosis (DVT) of axillary vein of left upper extremity ([place]) 03/12/2022   Colon cancer screening 08/01/2021   Clinical  diagnosis of COVID-19 12/28/2020   OAB (overactive bladder) 09/21/2020   Conjunctival injection, right 09/21/2020   Essential hypertension 09/21/2020   Urinary dysfunction 09/21/2020   Thoracic outlet syndrome 05/03/2020   Controlled type 1 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 ([place]) 03/24/2020   Right epiretinal membrane 03/24/2020   Retinal exudates and deposits 03/24/2020   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   Nuclear sclerotic cataract of both eyes 03/24/2020   Gait disorder 03/23/2020   Benign prostatic hyperplasia without lower urinary tract symptoms 03/01/2020   Diabetic gastroparesis associated with type 1 diabetes mellitus ([place]) 03/01/2020   Uncontrolled type 1 diabetes mellitus with hyperglycemia ([place]) 03/01/2020   Gait disturbance 08/19/2019   Acquired diplegia ([place]) 08/19/2019   Vitamin D insufficiency 11/29/2016   Hypogonadism in male 11/01/2016   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[place])    Hypothyroidism    Hypertension    Abnormal liver function tests 03/06/2013   Type I (juvenile type) diabetes mellitus without mention of complication, uncontrolled 12/03/2007   HYPERCHOLESTEROLEMIA 11/12/2007   MULTIPLE SCLEROSIS 08/29/2007   PROLIFERATIVE DIABETIC RETINOPATHY 08/29/2007    ONSET DATE: Referral date 02-07-22  REFERRING DIAG: G35 (ICD-10-CM) - Multiple sclerosis ([place]) R26.9 (ICD-10-CM) - Gait disturbance   THERAPY DIAG:  Other abnormalities of gait and mobility  Muscle weakness (  generalized)  Unsteadiness on feet  Rationale for Evaluation and Treatment Rehabilitation  SUBJECTIVE:                                                                                                                                                                                             SUBJECTIVE STATEMENT: Pt states it was a little more difficult walking back to gym this morning than what it has been - says Lt leg was a  little harder to move Pt accompanied by: self  PERTINENT HISTORY: vitamin D insufficiency, Type 1 IDDM w/ polyneuropathy, diabetic retinopathy, hypothyroidism, HTN, hypercholestrolemia, h/o Rt tibia fracture, h/o Lt ankle fracture with ORIF, MS with acquired diplegia   PAIN:   Are you having pain? No  PRECAUTIONS: Fall  WEIGHT BEARING RESTRICTIONS: No  FALLS: Has patient fallen in last 6 months? No  LIVING ENVIRONMENT: Lives with:  lives with their partner (girlfriend) Lives in: House/apartment Stairs: Yes; External: 6 steps; on right going up, on left going up, and can reach both Has following equipment at home: Walker - 2 wheeled, Wheelchair (manual), and shower chair  PLOF: Independent with basic ADLs, Independent with household mobility with device, Independent with transfers, and Needs assistance with homemaking 115' PATIENT GOALS: try to increase strength in lifting each leg up from hip in seated position (hip flexors); also increase strength in lifting Lt toes off floor (dorsiflexors)   OBJECTIVE:    TODAY'S TREATMENT:   05-03-22  GAIT: Gait pattern: pt uses vaulting gait pattern in which he lifts himself up and swings bil.LE's (paraplegic gait pattern) for increased speed, decreased step length- Left, decreased hip/knee flexion- Right, decreased hip/knee flexion- Left, decreased ankle dorsiflexion- Left, and poor foot clearance- Left Distance walked: 115' (for STG assessment) Assistive device utilized: Walker - 2 wheeled; pt is wearing his AFO on his RLE Level of assistance: SBA Comments: Several occurrences of Lt toes catching in swing phase of gait due to increased spasticity; pt using reciprocal gait pattern  Pt ambulates into gym from lobby requiring increased time w/ frequent stops due to spasticity; uses swing thru gait pattern for increased speed; uses reciprocal gait pattern when no one is behind him and when not in a hurry   THEREX:   Prone knee flexion/quad  stretching AAROM 10 reps each leg; cues for eccentric hamstring control as able   Hip extension with knee flexed at 90 degrees - pt in prone position 10 reps each leg with min assist to maintain knee flexion at 90  Pt performed hip flexion LLE in Rt sidelying position with min assist to maintain Lt knee in   90 degrees of flexion - passive stretch for Lt hip flexors with resistance as tolerated for hip flexion; 2 sets 10 reps    NEURORE-ED:   Sidestepping inside // bars 10' x 2 reps with squats for strengthening and for facilitation of bil. Hamstrings and also to reduce extensor tone - bil. UE support used on // bars  Pt performed quadruped position - child's pose for low back stretching   Pt performed knee extension control exercise in standing with red theraband - 15 reps each position - bilateral stance, forward & backward stance - LLE with bil. UE support on // bars  Pt performed stepping over and back of black balance bolster - 10 reps each leg; mod assist needed for LLE to clear bolster due to decreased active Lt knee flexion in swing   phase/open chain position; pt able to clear bolster with RLE due to some vaulting to compensate for decreased Rt hip flexor strength   PATIENT EDUCATION: Education details:  Discussed using prolonged rest, weight-bearing through LEs, and trialing alternating LE focused on when exercising to break tone cycle vs determining true muscle fatigue.   Person educated: Patient Education method: Explanation Education comprehension: verbalized understanding  HOME EXERCISE PROGRAM: [email or web address] (code from episode prior)   Access Code: [email or web address] URL: [email or web address] Date: 04/12/2022 Prepared by: Ethelene Browns  Exercises - Seated Hamstring Curl with Anchored Resistance  - 1 x daily - 7 x weekly - 1 sets - 10 reps - AND SEATED HIP ADDUCTION  - 1 x daily - 7 x weekly - 1 sets - 10 reps - 3-5 hold - Long Sitting Hamstring Stretch  - 1 x daily - 7 x  weekly - 1 sets - 1-2 reps - 30-60 secs hold  GOALS: Goals reviewed with patient? Yes  SHORT TERM GOALS: Target date: 04/20/2022  Improve 5x sit to stand to </= 14 secs from standard chair with bil. UE support. Baseline:  16.97 secs; 15.56 secs Goal status: Partially met - 04-17-22  2.  Pt will stand unsupported and reach 10" outside BOS with 1 UE support for increased independence with ADL's in standing. Baseline:  Goal status: Goal met - 04-17-22  3.  Improve gait speed to >/= .7 ft/sec with RW to demo improved gait efficiency. Baseline: .35 ft/sec with RW; .63 ft/sec = 51.97 secs -- 04-23-22 Goal status: Goal not met 04-23-22  4.  Improve TUG score to </= 50 secs with RW to demo increased functional mobility. Baseline: 57.85 secs with RW; 1'16".75;   04-23-22:  49.32 secs Goal status: Goal not met 04-17-22  5.  Amb. 115' with RW on flat, even surface without requiring seated rest period.  Baseline: 65' with RW Goal status: Goal met 04-17-22  6.  Independent in HEP for stretching & strengthening. Baseline:  Goal status: Goal met 04-17-22  LONG TERM GOALS: Target date: 05/18/2022  Improve 5x sit to stand to </= 12 secs from standard chair with bil. UE support. Baseline:  16.97 secs Goal status: INITIAL  2.   Pt will stand unsupported and reach 6" outside BOS without UE support for increased independence with ADL's in standing. Baseline:  Goal status: INITIAL  3.  Improve gait speed to >/= 1.0 ft/sec with RW to demo improved gait efficiency. Baseline: .35 ft/sec with RW Goal status: INITIAL  4.   Improve TUG score to </= 44 secs with RW to demo increased functional mobility. Baseline: 57.85 secs with RW Goal status: INITIAL  5.  Amb. 200' with RW on flat, even surface without requiring seated rest period.  Baseline: 69' with RW Goal status: INITIAL  6.  Independent in updated HEP for stretching & strengthening. Baseline:  Goal status:  INITIAL ASSESSMENT:  CLINICAL IMPRESSION: PT session focused on strengthening hamstrings and hip flexors in both closed and open chain positions, with pt unable to actively flex Lt knee in open chain due to extensor tone.   Pt reported LLE weaker today with active movement difficult with gait - pt had several occurrences of Lt foot/toes catching floor during swing phase of gait.  Cont with POC.   OBJECTIVE IMPAIRMENTS: Abnormal gait, decreased activity tolerance, decreased balance, decreased endurance, difficulty walking, decreased ROM, decreased strength, increased muscle spasms, and impaired tone.   ACTIVITY LIMITATIONS: carrying, bending, standing, squatting, stairs, transfers, and locomotion level  PARTICIPATION LIMITATIONS: meal prep, cleaning, laundry, driving, shopping, community activity, and yard work  PERSONAL FACTORS: Past/current experiences, Time since onset of injury/illness/exacerbation, and progressive nature of MS disease process  are also affecting patient's functional outcome.   REHAB POTENTIAL: Fair due to chronicity of deficits and progressive nature of MS disease process  CLINICAL DECISION MAKING: Stable/uncomplicated  EVALUATION COMPLEXITY: Low  PLAN:  PT FREQUENCY: 2x/week  PT DURATION: 8 weeks  PLANNED INTERVENTIONS: Therapeutic exercises, Therapeutic activity, Neuromuscular re-education, Balance training, Gait training, Patient/Family education, Self Care, Stair training, Orthotic/Fit training, and DME instructions  PLAN FOR NEXT SESSION: Mar[place] - any strengthening you choose to do --core strength in long-sitting; work on hip flexor and Lt DF strengthening; SciFit, standing balance   Daneli Butkiewicz, [place], PT 05/03/2022, 12:44 PM

## 2022-05-07 ENCOUNTER — Ambulatory Visit: Payer: Medicare PPO | Admitting: Physical Therapy

## 2022-05-07 ENCOUNTER — Encounter: Payer: Self-pay | Admitting: Physical Therapy

## 2022-05-07 DIAGNOSIS — M21372 Foot drop, left foot: Secondary | ICD-10-CM

## 2022-05-07 DIAGNOSIS — M6281 Muscle weakness (generalized): Secondary | ICD-10-CM

## 2022-05-07 DIAGNOSIS — R29818 Other symptoms and signs involving the nervous system: Secondary | ICD-10-CM | POA: Diagnosis not present

## 2022-05-07 DIAGNOSIS — R296 Repeated falls: Secondary | ICD-10-CM | POA: Diagnosis not present

## 2022-05-07 DIAGNOSIS — M21371 Foot drop, right foot: Secondary | ICD-10-CM

## 2022-05-07 DIAGNOSIS — R2681 Unsteadiness on feet: Secondary | ICD-10-CM

## 2022-05-07 DIAGNOSIS — R2689 Other abnormalities of gait and mobility: Secondary | ICD-10-CM

## 2022-05-07 NOTE — Therapy (Signed)
OUTPATIENT PHYSICAL THERAPY NEURO TREATMENT     Patient Name: Johnathan Ross MRN: [id number] DOB:14-Nov-1970, 51 y.o., male Today's Date: 05/07/2022   PCP: Janith Lima, MD REFERRING PROVIDER: Britt Bottom, MD    PT End of Session - 05/07/22 0845     Visit Number 12    Number of Visits 17    Date for PT Re-Evaluation 05/18/22    Authorization Type [employer]    Authorization Time Period 03-19-22 - 05-27-22    Authorization - Number of Visits 16    PT Start Time [phone]   pt agreeable to starting early   PT Stop Time 0929    PT Time Calculation (min) 48 min    Equipment Utilized During Treatment --    Activity Tolerance Patient tolerated treatment well    Behavior During Therapy [facility] for tasks assessed/performed                    Past Medical History:  Diagnosis Date   Hyperlipidemia    Hypertension    Hypogonadism in male 11/01/2016   Hypothyroidism    Multiple sclerosis ([place])    Neuromuscular disorder ([place])    Proliferative diabetic retinopathy(362.02)    Type 1 diabetes mellitus ([place]) dx'd 1994   Vitamin D insufficiency 11/29/2016   Past Surgical History:  Procedure Laterality Date   EYE SURGERY Bilateral    "laser for diabetic retinopathy"   FRACTURE SURGERY     IR VENO/EXT/UNI LEFT  03/24/2020   OPEN REDUCTION INTERNAL FIXATION (ORIF) TIBIA/FIBULA FRACTURE Right 10/30/2016   ORIF ANKLE FRACTURE Left 2015   ORIF TIBIA FRACTURE Right 10/30/2016   Procedure: OPEN REDUCTION INTERNAL FIXATION (ORIF) TIBIA FIBULA FRACTURE;  Surgeon: Altamese Odenton, MD;  Location: [place];  Service: Orthopedics;  Laterality: Right;   RETINAL LASER PROCEDURE Bilateral    "for diabetic retinopathy"   Patient Active Problem List   Diagnosis Date Noted   PHN (postherpetic neuralgia) 03/12/2022   Acute deep vein thrombosis (DVT) of axillary vein of left upper extremity ([place]) 03/12/2022   Colon cancer screening 08/01/2021   Clinical diagnosis of COVID-19 12/28/2020    OAB (overactive bladder) 09/21/2020   Conjunctival injection, right 09/21/2020   Essential hypertension 09/21/2020   Urinary dysfunction 09/21/2020   Thoracic outlet syndrome 05/03/2020   Controlled type 1 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 ([place]) 03/24/2020   Right epiretinal membrane 03/24/2020   Retinal exudates and deposits 03/24/2020   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   Nuclear sclerotic cataract of both eyes 03/24/2020   Gait disorder 03/23/2020   Benign prostatic hyperplasia without lower urinary tract symptoms 03/01/2020   Diabetic gastroparesis associated with type 1 diabetes mellitus ([place]) 03/01/2020   Uncontrolled type 1 diabetes mellitus with hyperglycemia ([place]) 03/01/2020   Gait disturbance 08/19/2019   Acquired diplegia ([place]) 08/19/2019   Vitamin D insufficiency 11/29/2016   Hypogonadism in male 11/01/2016   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[place])    Hypothyroidism    Hypertension    Abnormal liver function tests 03/06/2013   Type I (juvenile type) diabetes mellitus without mention of complication, uncontrolled 12/03/2007   HYPERCHOLESTEROLEMIA 11/12/2007   MULTIPLE SCLEROSIS 08/29/2007   PROLIFERATIVE DIABETIC RETINOPATHY 08/29/2007    ONSET DATE: Referral date 02-07-22  REFERRING DIAG: G35 (ICD-10-CM) - Multiple sclerosis ([place]) R26.9 (ICD-10-CM) - Gait disturbance   THERAPY DIAG:  Other abnormalities of gait and mobility  Muscle weakness (generalized)  Unsteadiness on feet  Foot drop, right  Foot drop, left  Repeated falls  Other symptoms and signs involving the nervous system  Rationale for Evaluation and Treatment Rehabilitation  SUBJECTIVE:                                                                                                                                                                                             SUBJECTIVE STATEMENT: Left leg is doing better today, pt  states he has not put them to work yet today, but feels they are moving better today. Pt accompanied by: self  PERTINENT HISTORY: vitamin D insufficiency, Type 1 IDDM w/ polyneuropathy, diabetic retinopathy, hypothyroidism, HTN, hypercholestrolemia, h/o Rt tibia fracture, h/o Lt ankle fracture with ORIF, MS with acquired diplegia   PAIN:   Are you having pain? No  PRECAUTIONS: Fall  WEIGHT BEARING RESTRICTIONS: No  FALLS: Has patient fallen in last 6 months? No  LIVING ENVIRONMENT: Lives with:  lives with their partner (girlfriend) Lives in: House/apartment Stairs: Yes; External: 6 steps; on right going up, on left going up, and can reach both Has following equipment at home: Gilford Rile - 2 wheeled, Wheelchair (manual), and shower chair  PLOF: Independent with basic ADLs, Independent with household mobility with device, Independent with transfers, and Needs assistance with homemaking 115' PATIENT GOALS: try to increase strength in lifting each leg up from hip in seated position (hip flexors); also increase strength in lifting Lt toes off floor (dorsiflexors)   OBJECTIVE:    TODAY'S TREATMENT:   05-03-22  GAIT: Gait pattern: pt uses vaulting gait pattern in which he lifts himself up and swings bil.LE's (paraplegic gait pattern) for increased speed, decreased step length- Left, decreased hip/knee flexion- Right, decreased hip/knee flexion- Left, decreased ankle dorsiflexion- Left, and poor foot clearance- Left Distance walked: various clinic distances Assistive device utilized: Environmental consultant - 2 wheeled; pt is wearing his AFO on his RLE Level of assistance: ModI Comments:   Pt ambulates into gym from lobby requiring minimal increased time, pt only requires singular stop and rest; uses swing thru gait pattern for increased speed; uses reciprocal gait pattern when no one is behind him and when not in a hurry   THEREX: -Quadruped 3.3 lb ball D1/D2 w/ head and eye tracking to promote trunk  rotation strength -Quadruped hip flexion until fatigue x2 sets each side, cues and facilitation to prevent using forward trunk momentum to substitute for hip flexor   NEURORE-ED: -Side-stepping at countertop w/o UE support maintaining soft squat 2x10' each direction, pt has tendency to bring left LE into hip extension during stepping to the right due to tone. -Wide stance  lateral lunge and reach w/ toes forward > modified warrior at [employer] w/ therapist facilitating weight shift to promote increased quad engagement     PATIENT EDUCATION: Education details:  Continue HEP.  Ongoing edu about MS and tone impacts on activity performed.  Discussed modification of existing hip flexion activity at home as pt is having more trouble fighting tone coming into hip flexion so discussed possible setups of exercise performed on mat today vs tall kneeling in chair.  Person educated: Patient Education method: Explanation Education comprehension: verbalized understanding  HOME EXERCISE PROGRAM: [email or web address] (code from episode prior)   Access Code: [email or web address] URL: [email or web address] Date: 04/12/2022 Prepared by: Ethelene Browns  Exercises - Seated Hamstring Curl with Anchored Resistance  - 1 x daily - 7 x weekly - 1 sets - 10 reps - AND SEATED HIP ADDUCTION  - 1 x daily - 7 x weekly - 1 sets - 10 reps - 3-5 hold - Long Sitting Hamstring Stretch  - 1 x daily - 7 x weekly - 1 sets - 1-2 reps - 30-60 secs hold  GOALS: Goals reviewed with patient? Yes  SHORT TERM GOALS: Target date: 04/20/2022  Improve 5x sit to stand to </= 14 secs from standard chair with bil. UE support. Baseline:  16.97 secs; 15.56 secs Goal status: Partially met - 04-17-22  2.  Pt will stand unsupported and reach 10" outside BOS with 1 UE support for increased independence with ADL's in standing. Baseline:  Goal status: Goal met - 04-17-22  3.  Improve gait speed to >/= .7 ft/sec with RW to demo improved gait  efficiency. Baseline: .35 ft/sec with RW; .63 ft/sec = 51.97 secs -- 04-23-22 Goal status: Goal not met 04-23-22  4.  Improve TUG score to </= 50 secs with RW to demo increased functional mobility. Baseline: 57.85 secs with RW; 1'16".75;   04-23-22:  49.32 secs Goal status: Goal not met 04-17-22  5.  Amb. 115' with RW on flat, even surface without requiring seated rest period.  Baseline: 52' with RW Goal status: Goal met 04-17-22  6.  Independent in HEP for stretching & strengthening. Baseline:  Goal status: Goal met 04-17-22  LONG TERM GOALS: Target date: 05/18/2022  Improve 5x sit to stand to </= 12 secs from standard chair with bil. UE support. Baseline:  16.97 secs Goal status: INITIAL  2.   Pt will stand unsupported and reach 6" outside BOS without UE support for increased independence with ADL's in standing. Baseline:  Goal status: INITIAL  3.  Improve gait speed to >/= 1.0 ft/sec with RW to demo improved gait efficiency. Baseline: .35 ft/sec with RW Goal status: INITIAL  4.   Improve TUG score to </= 44 secs with RW to demo increased functional mobility. Baseline: 57.85 secs with RW Goal status: INITIAL  5.  Amb. 200' with RW on flat, even surface without requiring seated rest period.  Baseline: 71' with RW Goal status: INITIAL  6.  Independent in updated HEP for stretching & strengthening. Baseline:  Goal status: INITIAL ASSESSMENT:  CLINICAL IMPRESSION: Continued focus on core and pelvic engagement to encourage improved LE volitional mobility.  Worked on weight shifting and muscular engagement of the hip flexors, hamstrings, and gluts w/ tone impacting quality of movement.  He continues to fatigue quicker on the right hemibody than the left, but further benefits from skilled PT to address ongoing impacts of MS and answer questions related to functional mobility.  OBJECTIVE IMPAIRMENTS: Abnormal gait, decreased activity  tolerance, decreased balance, decreased  endurance, difficulty walking, decreased ROM, decreased strength, increased muscle spasms, and impaired tone.   ACTIVITY LIMITATIONS: carrying, bending, standing, squatting, stairs, transfers, and locomotion level  PARTICIPATION LIMITATIONS: meal prep, cleaning, laundry, driving, shopping, community activity, and yard work  PERSONAL FACTORS: Past/current experiences, Time since onset of injury/illness/exacerbation, and progressive nature of MS disease process  are also affecting patient's functional outcome.   REHAB POTENTIAL: Fair due to chronicity of deficits and progressive nature of MS disease process  CLINICAL DECISION MAKING: Stable/uncomplicated  EVALUATION COMPLEXITY: Low  PLAN:  PT FREQUENCY: 2x/week  PT DURATION: 8 weeks  PLANNED INTERVENTIONS: Therapeutic exercises, Therapeutic activity, Neuromuscular re-education, Balance training, Gait training, Patient/Family education, Self Care, Stair training, Orthotic/Fit training, and DME instructions  PLAN FOR NEXT SESSION: If planning to discharge discuss with pt-pt reports prior episode this was not discussed beforehand. -core strength in long-sitting; work on hip flexor and Lt DF strengthening; SciFit, standing balance   Bary Richard, PT, DPT 05/07/2022, 10:58 AM

## 2022-05-10 ENCOUNTER — Ambulatory Visit: Payer: Medicare PPO | Admitting: Physical Therapy

## 2022-05-10 ENCOUNTER — Encounter: Payer: Self-pay | Admitting: Physical Therapy

## 2022-05-10 DIAGNOSIS — R2681 Unsteadiness on feet: Secondary | ICD-10-CM

## 2022-05-10 DIAGNOSIS — R296 Repeated falls: Secondary | ICD-10-CM | POA: Diagnosis not present

## 2022-05-10 DIAGNOSIS — R29818 Other symptoms and signs involving the nervous system: Secondary | ICD-10-CM | POA: Diagnosis not present

## 2022-05-10 DIAGNOSIS — R2689 Other abnormalities of gait and mobility: Secondary | ICD-10-CM

## 2022-05-10 DIAGNOSIS — M6281 Muscle weakness (generalized): Secondary | ICD-10-CM

## 2022-05-10 DIAGNOSIS — M21371 Foot drop, right foot: Secondary | ICD-10-CM | POA: Diagnosis not present

## 2022-05-10 DIAGNOSIS — M21372 Foot drop, left foot: Secondary | ICD-10-CM | POA: Diagnosis not present

## 2022-05-10 NOTE — Therapy (Signed)
OUTPATIENT PHYSICAL THERAPY NEURO TREATMENT     Patient Name: Johnathan Ross MRN: [id number] DOB:1970/08/21, 51 y.o., male Today's Date: 05/10/2022   PCP: Janith Lima, MD REFERRING PROVIDER: Britt Bottom, MD    PT End of Session - 05/10/22 1637     Visit Number 13    Number of Visits 17    Date for PT Re-Evaluation 05/18/22    Authorization Type [employer]    Authorization Time Period 03-19-22 - 05-27-22    Authorization - Visit Number 2    Authorization - Number of Visits 16    PT Start Time 0850    PT Stop Time 0935    PT Time Calculation (min) 45 min    Equipment Utilized During Treatment Other (comment)   Kay bench, powder board   Activity Tolerance Patient tolerated treatment well    Behavior During Therapy [facility] for tasks assessed/performed                     Past Medical History:  Diagnosis Date   Hyperlipidemia    Hypertension    Hypogonadism in male 11/01/2016   Hypothyroidism    Multiple sclerosis ([place])    Neuromuscular disorder ([place])    Proliferative diabetic retinopathy(362.02)    Type 1 diabetes mellitus ([place]) dx'd 1994   Vitamin D insufficiency 11/29/2016   Past Surgical History:  Procedure Laterality Date   EYE SURGERY Bilateral    "laser for diabetic retinopathy"   FRACTURE SURGERY     IR VENO/EXT/UNI LEFT  03/24/2020   OPEN REDUCTION INTERNAL FIXATION (ORIF) TIBIA/FIBULA FRACTURE Right 10/30/2016   ORIF ANKLE FRACTURE Left 2015   ORIF TIBIA FRACTURE Right 10/30/2016   Procedure: OPEN REDUCTION INTERNAL FIXATION (ORIF) TIBIA FIBULA FRACTURE;  Surgeon: Altamese [place], MD;  Location: [place];  Service: Orthopedics;  Laterality: Right;   RETINAL LASER PROCEDURE Bilateral    "for diabetic retinopathy"   Patient Active Problem List   Diagnosis Date Noted   PHN (postherpetic neuralgia) 03/12/2022   Acute deep vein thrombosis (DVT) of axillary vein of left upper extremity ([place]) 03/12/2022   Colon cancer screening 08/01/2021    Clinical diagnosis of COVID-19 12/28/2020   OAB (overactive bladder) 09/21/2020   Conjunctival injection, right 09/21/2020   Essential hypertension 09/21/2020   Urinary dysfunction 09/21/2020   Thoracic outlet syndrome 05/03/2020   Controlled type 1 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 ([place]) 03/24/2020   Right epiretinal membrane 03/24/2020   Retinal exudates and deposits 03/24/2020   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   Nuclear sclerotic cataract of both eyes 03/24/2020   Gait disorder 03/23/2020   Benign prostatic hyperplasia without lower urinary tract symptoms 03/01/2020   Diabetic gastroparesis associated with type 1 diabetes mellitus ([place]) 03/01/2020   Uncontrolled type 1 diabetes mellitus with hyperglycemia ([place]) 03/01/2020   Gait disturbance 08/19/2019   Acquired diplegia ([place]) 08/19/2019   Vitamin D insufficiency 11/29/2016   Hypogonadism in male 11/01/2016   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[place])    Hypothyroidism    Hypertension    Abnormal liver function tests 03/06/2013   Type I (juvenile type) diabetes mellitus without mention of complication, uncontrolled 12/03/2007   HYPERCHOLESTEROLEMIA 11/12/2007   MULTIPLE SCLEROSIS 08/29/2007   PROLIFERATIVE DIABETIC RETINOPATHY 08/29/2007    ONSET DATE: Referral date 02-07-22  REFERRING DIAG: G35 (ICD-10-CM) - Multiple sclerosis ([place]) R26.9 (ICD-10-CM) - Gait disturbance   THERAPY DIAG:  Other abnormalities of gait and mobility  Muscle weakness (generalized)  Unsteadiness on feet  Rationale for Evaluation and Treatment Rehabilitation  SUBJECTIVE:                                                                                                                                                                                             SUBJECTIVE STATEMENT: Pt reports no changes - says left leg is doing pretty good today - caught a few times during walking  from lobby back to gym just now Pt accompanied by: self  PERTINENT HISTORY: vitamin D insufficiency, Type 1 IDDM w/ polyneuropathy, diabetic retinopathy, hypothyroidism, HTN, hypercholestrolemia, h/o Rt tibia fracture, h/o Lt ankle fracture with ORIF, MS with acquired diplegia   PAIN:   Are you having pain? No  PRECAUTIONS: Fall  WEIGHT BEARING RESTRICTIONS: No  FALLS: Has patient fallen in last 6 months? No  LIVING ENVIRONMENT: Lives with:  lives with their partner (girlfriend) Lives in: House/apartment Stairs: Yes; External: 6 steps; on right going up, on left going up, and can reach both Has following equipment at home: Gilford Rile - 2 wheeled, Wheelchair (manual), and shower chair  PLOF: Independent with basic ADLs, Independent with household mobility with device, Independent with transfers, and Needs assistance with homemaking 115' PATIENT GOALS: try to increase strength in lifting each leg up from hip in seated position (hip flexors); also increase strength in lifting Lt toes off floor (dorsiflexors)   OBJECTIVE:    TODAY'S TREATMENT:   05-10-22  GAIT: Gait pattern: pt uses vaulting gait pattern in which he lifts himself up and swings bil.LE's (paraplegic gait pattern) for increased speed, decreased step length- Left, decreased hip/knee flexion- Right, decreased hip/knee flexion- Left, decreased ankle dorsiflexion- Left, and poor foot clearance- Left Distance walked: 115' (for STG assessment) Assistive device utilized: Environmental consultant - 2 wheeled; pt is wearing his AFO on his RLE Level of assistance: SBA Comments: Several occurrences of Lt toes catching in swing phase of gait due to increased spasticity; pt using reciprocal gait pattern  Pt ambulates into gym from lobby requiring increased time w/ frequent stops due to spasticity; uses swing thru gait pattern for increased speed; uses reciprocal gait pattern when no one is behind him and when not in a hurry   THEREX:      Pt performed  knee flexion/extension with use of powderboard in sidelying position - pillow case on each lower leg - 2 sets 10 reps  Pt performed hip flexion/extension bil. LE's in each sidelying position with use of powder board - pillow case placed on each lower leg to decrease friction - min assist to maintain Lt knee in 90  degrees of flexion - 2 sets 10 reps     NEURORE-ED:   Pt performed tall kneeling position - kay bench in front for UE support prn      Pt lifted each UE to approx. 100 degrees flexion 3 reps each; bil. UE's together 5 reps each; trunk rotation with UE's held in 90 degree shoulder flexion 5 reps to each side with CGA to min assist Pt 10 mini squats - sitting back towards heels 10 reps Performed hip flexion/extension 10 reps each leg in tall kneeling position with PT holding foot off mat to reduce friction/increase ease with this exercise   PATIENT EDUCATION: Education details:  Discussed using prolonged rest, weight-bearing through LEs, and trialing alternating LE focused on when exercising to break tone cycle vs determining true muscle fatigue.   Person educated: Patient Education method: Explanation Education comprehension: verbalized understanding  HOME EXERCISE PROGRAM: [email or web address] (code from episode prior)   Access Code: [email or web address] URL: [email or web address] Date: 04/12/2022 Prepared by: Ethelene Browns  Exercises - Seated Hamstring Curl with Anchored Resistance  - 1 x daily - 7 x weekly - 1 sets - 10 reps - AND SEATED HIP ADDUCTION  - 1 x daily - 7 x weekly - 1 sets - 10 reps - 3-5 hold - Long Sitting Hamstring Stretch  - 1 x daily - 7 x weekly - 1 sets - 1-2 reps - 30-60 secs hold  GOALS: Goals reviewed with patient? Yes  SHORT TERM GOALS: Target date: 04/20/2022  Improve 5x sit to stand to </= 14 secs from standard chair with bil. UE support. Baseline:  16.97 secs; 15.56 secs Goal status: Partially met - 04-17-22  2.  Pt will stand unsupported and reach  10" outside BOS with 1 UE support for increased independence with ADL's in standing. Baseline:  Goal status: Goal met - 04-17-22  3.  Improve gait speed to >/= .7 ft/sec with RW to demo improved gait efficiency. Baseline: .35 ft/sec with RW; .63 ft/sec = 51.97 secs -- 04-23-22 Goal status: Goal not met 04-23-22  4.  Improve TUG score to </= 50 secs with RW to demo increased functional mobility. Baseline: 57.85 secs with RW; 1'16".75;   04-23-22:  49.32 secs Goal status: Goal not met 04-17-22  5.  Amb. 115' with RW on flat, even surface without requiring seated rest period.  Baseline: 62' with RW Goal status: Goal met 04-17-22  6.  Independent in HEP for stretching & strengthening. Baseline:  Goal status: Goal met 04-17-22  LONG TERM GOALS: Target date: 05/18/2022  Improve 5x sit to stand to </= 12 secs from standard chair with bil. UE support. Baseline:  16.97 secs Goal status: INITIAL  2.   Pt will stand unsupported and reach 6" outside BOS without UE support for increased independence with ADL's in standing. Baseline:  Goal status: INITIAL  3.  Improve gait speed to >/= 1.0 ft/sec with RW to demo improved gait efficiency. Baseline: .35 ft/sec with RW Goal status: INITIAL  4.   Improve TUG score to </= 44 secs with RW to demo increased functional mobility. Baseline: 57.85 secs with RW Goal status: INITIAL  5.  Amb. 200' with RW on flat, even surface without requiring seated rest period.  Baseline: 44' with RW Goal status: INITIAL  6.  Independent in updated HEP for stretching & strengthening. Baseline:  Goal status: INITIAL ASSESSMENT:  CLINICAL IMPRESSION: PT session focused on strengthening hamstrings and hip flexors in open chain position with use  of powder board for increased ease.  Extensor tone occurred in RLE with hip extension past neutral as pt strained to perform additional ROM for hip extension.  Pt able to maintain balance in tall kneeling with bil. UE  flexion.  Cont with POC.   OBJECTIVE IMPAIRMENTS: Abnormal gait, decreased activity tolerance, decreased balance, decreased endurance, difficulty walking, decreased ROM, decreased strength, increased muscle spasms, and impaired tone.   ACTIVITY LIMITATIONS: carrying, bending, standing, squatting, stairs, transfers, and locomotion level  PARTICIPATION LIMITATIONS: meal prep, cleaning, laundry, driving, shopping, community activity, and yard work  PERSONAL FACTORS: Past/current experiences, Time since onset of injury/illness/exacerbation, and progressive nature of MS disease process  are also affecting patient's functional outcome.   REHAB POTENTIAL: Fair due to chronicity of deficits and progressive nature of MS disease process  CLINICAL DECISION MAKING: Stable/uncomplicated  EVALUATION COMPLEXITY: Low  PLAN:  PT FREQUENCY: 2x/week  PT DURATION: 8 weeks  PLANNED INTERVENTIONS: Therapeutic exercises, Therapeutic activity, Neuromuscular re-education, Balance training, Gait training, Patient/Family education, Self Care, Stair training, Orthotic/Fit training, and DME instructions  PLAN FOR NEXT SESSION: begin checking LTG's for D/C next week;  core strength in long-sitting; work on hip flexor and Lt DF strengthening; SciFit, standing balance   Micca Matura, [place], PT 05/10/2022, 4:39 PM

## 2022-05-14 ENCOUNTER — Encounter: Payer: Self-pay | Admitting: Physical Therapy

## 2022-05-14 ENCOUNTER — Ambulatory Visit: Payer: Medicare PPO | Admitting: Physical Therapy

## 2022-05-14 DIAGNOSIS — M21372 Foot drop, left foot: Secondary | ICD-10-CM | POA: Diagnosis not present

## 2022-05-14 DIAGNOSIS — R2689 Other abnormalities of gait and mobility: Secondary | ICD-10-CM

## 2022-05-14 DIAGNOSIS — R2681 Unsteadiness on feet: Secondary | ICD-10-CM | POA: Diagnosis not present

## 2022-05-14 DIAGNOSIS — R296 Repeated falls: Secondary | ICD-10-CM | POA: Diagnosis not present

## 2022-05-14 DIAGNOSIS — M6281 Muscle weakness (generalized): Secondary | ICD-10-CM | POA: Diagnosis not present

## 2022-05-14 DIAGNOSIS — M21371 Foot drop, right foot: Secondary | ICD-10-CM | POA: Diagnosis not present

## 2022-05-14 DIAGNOSIS — R29818 Other symptoms and signs involving the nervous system: Secondary | ICD-10-CM | POA: Diagnosis not present

## 2022-05-14 NOTE — Therapy (Signed)
OUTPATIENT PHYSICAL THERAPY NEURO TREATMENT     Patient Name: Johnathan Ross MRN: [id number] DOB:10-30-1970, 51 y.o., male Today's Date: 05/14/2022   PCP: Janith Lima, MD REFERRING PROVIDER: Britt Bottom, MD    PT End of Session - 05/14/22 1022     Visit Number 14    Number of Visits 17    Date for PT Re-Evaluation 05/18/22    Authorization Type [employer]    Authorization Time Period 03-19-22 - 05-27-22    Authorization - Visit Number 54    Authorization - Number of Visits 16    PT Start Time 4287    PT Stop Time 0931    PT Time Calculation (min) 41 min    Activity Tolerance Patient tolerated treatment well    Behavior During Therapy [facility] for tasks assessed/performed                     Past Medical History:  Diagnosis Date   Hyperlipidemia    Hypertension    Hypogonadism in male 11/01/2016   Hypothyroidism    Multiple sclerosis ([place])    Neuromuscular disorder ([place])    Proliferative diabetic retinopathy(362.02)    Type 1 diabetes mellitus ([place]) dx'd 1994   Vitamin D insufficiency 11/29/2016   Past Surgical History:  Procedure Laterality Date   EYE SURGERY Bilateral    "laser for diabetic retinopathy"   FRACTURE SURGERY     IR VENO/EXT/UNI LEFT  03/24/2020   OPEN REDUCTION INTERNAL FIXATION (ORIF) TIBIA/FIBULA FRACTURE Right 10/30/2016   ORIF ANKLE FRACTURE Left 2015   ORIF TIBIA FRACTURE Right 10/30/2016   Procedure: OPEN REDUCTION INTERNAL FIXATION (ORIF) TIBIA FIBULA FRACTURE;  Surgeon: Altamese [place], MD;  Location: [place];  Service: Orthopedics;  Laterality: Right;   RETINAL LASER PROCEDURE Bilateral    "for diabetic retinopathy"   Patient Active Problem List   Diagnosis Date Noted   PHN (postherpetic neuralgia) 03/12/2022   Acute deep vein thrombosis (DVT) of axillary vein of left upper extremity ([place]) 03/12/2022   Colon cancer screening 08/01/2021   Clinical diagnosis of COVID-19 12/28/2020   OAB (overactive bladder)  09/21/2020   Conjunctival injection, right 09/21/2020   Essential hypertension 09/21/2020   Urinary dysfunction 09/21/2020   Thoracic outlet syndrome 05/03/2020   Controlled type 1 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 ([place]) 03/24/2020   Right epiretinal membrane 03/24/2020   Retinal exudates and deposits 03/24/2020   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   Nuclear sclerotic cataract of both eyes 03/24/2020   Gait disorder 03/23/2020   Benign prostatic hyperplasia without lower urinary tract symptoms 03/01/2020   Diabetic gastroparesis associated with type 1 diabetes mellitus ([place]) 03/01/2020   Uncontrolled type 1 diabetes mellitus with hyperglycemia ([place]) 03/01/2020   Gait disturbance 08/19/2019   Acquired diplegia ([place]) 08/19/2019   Vitamin D insufficiency 11/29/2016   Hypogonadism in male 11/01/2016   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[place])    Hypothyroidism    Hypertension    Abnormal liver function tests 03/06/2013   Type I (juvenile type) diabetes mellitus without mention of complication, uncontrolled 12/03/2007   HYPERCHOLESTEROLEMIA 11/12/2007   MULTIPLE SCLEROSIS 08/29/2007   PROLIFERATIVE DIABETIC RETINOPATHY 08/29/2007    ONSET DATE: Referral date 02-07-22  REFERRING DIAG: G35 (ICD-10-CM) - Multiple sclerosis ([place]) R26.9 (ICD-10-CM) - Gait disturbance   THERAPY DIAG:  Other abnormalities of gait and mobility  Muscle weakness (generalized)  Rationale for Evaluation and Treatment Rehabilitation  SUBJECTIVE:  SUBJECTIVE STATEMENT: Pt reports no changes - no falls Pt accompanied by: self  PERTINENT HISTORY: vitamin D insufficiency, Type 1 IDDM w/ polyneuropathy, diabetic retinopathy, hypothyroidism, HTN, hypercholestrolemia,  h/o Rt tibia fracture, h/o Lt ankle fracture with ORIF, MS with acquired diplegia   PAIN:   Are you having pain? No  PRECAUTIONS: Fall  WEIGHT BEARING RESTRICTIONS: No  FALLS: Has patient fallen in last 6 months? No  LIVING ENVIRONMENT: Lives with:  lives with their partner (girlfriend) Lives in: House/apartment Stairs: Yes; External: 6 steps; on right going up, on left going up, and can reach both Has following equipment at home: Gilford Rile - 2 wheeled, Wheelchair (manual), and shower chair  PLOF: Independent with basic ADLs, Independent with household mobility with device, Independent with transfers, and Needs assistance with homemaking  PATIENT GOALS: try to increase strength in lifting each leg up from hip in seated position (hip flexors); also increase strength in lifting Lt toes off floor (dorsiflexors)   OBJECTIVE:    TODAY'S TREATMENT:   05-14-22  GAIT: Gait pattern: pt uses vaulting gait pattern in which he lifts himself up and swings bil.LE's (paraplegic gait pattern) for increased speed, decreased step length- Left, decreased hip/knee flexion- Right, decreased hip/knee flexion- Left, decreased ankle dorsiflexion- Left, and poor foot clearance- Left Distance walked: 70' from lobby to gym Assistive device utilized: Environmental consultant - 2 wheeled; pt is wearing his AFO on his RLE Level of assistance: SBA Comments: Several occurrences of Lt toes catching in swing phase of gait due to increased spasticity; pt using reciprocal gait pattern  Pt ambulates into gym from lobby requiring increased time w/ frequent stops due to spasticity; uses swing thru gait pattern for increased speed; uses reciprocal gait pattern when no one is behind him and when not in a hurry   THEREX:    Pt performed standing hip flexion onto 4" block - 15 reps RLE with 1 standing rest period;  7 reps LLE with bil. UE support then mod assist needed to complete lifting LLE onto   4" step; pt able to lift Lt foot off block  and back to floor with UE support on // bars with knee extended due to tone/spasticity  Standing knee extension control - with green theraband - 15 reps each position (bil. Stance, forward, backward stance) RLE and LLE   Seated knee flexion with green theraband - 15 reps each leg Seated hip flexion 5 reps 2 sets RLE;  5 reps LLE due to difficulty:  passive Lt hamstring stretching with pt in seated position - 20 sec hold x 1 rep; then pt performed 5 additional reps Lt hip flexion in seated position        PATIENT EDUCATION: Education details:  Discussed using prolonged rest, weight-bearing through LEs, and trialing alternating LE focused on when exercising to break tone cycle vs determining true muscle fatigue.   Person educated: Patient Education method: Explanation Education comprehension: verbalized understanding  HOME EXERCISE PROGRAM: [email or web address] (code from episode prior)   Access Code: [email or web address] URL: [email or web address] Date: 04/12/2022 Prepared by: Ethelene Browns  Exercises - Seated Hamstring Curl with Anchored Resistance  - 1 x daily - 7 x weekly - 1 sets - 10 reps - AND SEATED HIP ADDUCTION  - 1 x daily - 7 x weekly - 1 sets - 10 reps - 3-5 hold - Long Sitting Hamstring Stretch  - 1 x daily - 7 x weekly - 1 sets - 1-2 reps - 30-60 secs hold  GOALS:  Goals reviewed with patient? Yes  SHORT TERM GOALS: Target date: 04/20/2022  Improve 5x sit to stand to </= 14 secs from standard chair with bil. UE support. Baseline:  16.97 secs; 15.56 secs Goal status: Partially met - 04-17-22  2.  Pt will stand unsupported and reach 10" outside BOS with 1 UE support for increased independence with ADL's in standing. Baseline:  Goal status: Goal met - 04-17-22  3.  Improve gait speed to >/= .7 ft/sec with RW to demo improved gait efficiency. Baseline: .35 ft/sec with RW; .63 ft/sec = 51.97 secs -- 04-23-22 Goal status: Goal not met 04-23-22  4.  Improve TUG score to </= 50  secs with RW to demo increased functional mobility. Baseline: 57.85 secs with RW; 1'16".75;   04-23-22:  49.32 secs Goal status: Goal not met 04-17-22  5.  Amb. 115' with RW on flat, even surface without requiring seated rest period.  Baseline: 60' with RW Goal status: Goal met 04-17-22  6.  Independent in HEP for stretching & strengthening. Baseline:  Goal status: Goal met 04-17-22  LONG TERM GOALS: Target date: 05/18/2022  Improve 5x sit to stand to </= 12 secs from standard chair with bil. UE support. Baseline:  16.97 secs Goal status: INITIAL  2.   Pt will stand unsupported and reach 6" outside BOS without UE support for increased independence with ADL's in standing. Baseline:  Goal status: INITIAL  3.  Improve gait speed to >/= 1.0 ft/sec with RW to demo improved gait efficiency. Baseline: .35 ft/sec with RW Goal status: INITIAL  4.   Improve TUG score to </= 44 secs with RW to demo increased functional mobility. Baseline: 57.85 secs with RW Goal status: INITIAL  5.  Amb. 200' with RW on flat, even surface without requiring seated rest period.  Baseline: 66' with RW Goal status: INITIAL  6.  Independent in updated HEP for stretching & strengthening. Baseline:  Goal status: INITIAL ASSESSMENT:  CLINICAL IMPRESSION: PT session focused on strengthening hamstrings and hip flexors in closed chain position with use of 4" step; mod assist needed after 7 reps LLE to lift Lt foot onto 4" step due to weakness in hip flexors and extensor tone in LLE.  Pt able to actively flex Lt knee in seated position with minimal difficulty.  Pt did have difficulty lifting LLE in seated position due to Lt hip flexor weakness.   Cont with POC.   OBJECTIVE IMPAIRMENTS: Abnormal gait, decreased activity tolerance, decreased balance, decreased endurance, difficulty walking, decreased ROM, decreased strength, increased muscle spasms, and impaired tone.   ACTIVITY LIMITATIONS: carrying, bending,  standing, squatting, stairs, transfers, and locomotion level  PARTICIPATION LIMITATIONS: meal prep, cleaning, laundry, driving, shopping, community activity, and yard work  PERSONAL FACTORS: Past/current experiences, Time since onset of injury/illness/exacerbation, and progressive nature of MS disease process  are also affecting patient's functional outcome.   REHAB POTENTIAL: Fair due to chronicity of deficits and progressive nature of MS disease process  CLINICAL DECISION MAKING: Stable/uncomplicated  EVALUATION COMPLEXITY: Low  PLAN:  PT FREQUENCY: 2x/week  PT DURATION: 8 weeks  PLANNED INTERVENTIONS: Therapeutic exercises, Therapeutic activity, Neuromuscular re-education, Balance training, Gait training, Patient/Family education, Self Care, Stair training, Orthotic/Fit training, and DME instructions  PLAN FOR NEXT SESSION: Check LTG's, D/C next session:  core strength in long-sitting; work on hip flexor and Lt DF strengthening; SciFit, standing balance   Adien Kimmel, [place], PT 05/14/2022, 10:25 AM

## 2022-05-17 ENCOUNTER — Ambulatory Visit: Payer: Medicare PPO | Admitting: Physical Therapy

## 2022-05-17 ENCOUNTER — Other Ambulatory Visit: Payer: Self-pay | Admitting: Endocrinology

## 2022-05-17 DIAGNOSIS — M21371 Foot drop, right foot: Secondary | ICD-10-CM | POA: Diagnosis not present

## 2022-05-17 DIAGNOSIS — R2689 Other abnormalities of gait and mobility: Secondary | ICD-10-CM

## 2022-05-17 DIAGNOSIS — M21372 Foot drop, left foot: Secondary | ICD-10-CM | POA: Diagnosis not present

## 2022-05-17 DIAGNOSIS — R2681 Unsteadiness on feet: Secondary | ICD-10-CM

## 2022-05-17 DIAGNOSIS — M6281 Muscle weakness (generalized): Secondary | ICD-10-CM

## 2022-05-17 DIAGNOSIS — R296 Repeated falls: Secondary | ICD-10-CM | POA: Diagnosis not present

## 2022-05-17 DIAGNOSIS — R29818 Other symptoms and signs involving the nervous system: Secondary | ICD-10-CM | POA: Diagnosis not present

## 2022-05-17 NOTE — Therapy (Signed)
OUTPATIENT PHYSICAL THERAPY NEURO TREATMENT     Patient Name: Johnathan Ross MRN: [id number] DOB:[birth year], 51 y.o., male Today's Date: 05/18/2022   PCP: Janith Lima, MD REFERRING PROVIDER: Britt Bottom, MD    PT End of Session - 05/18/22 1454     Visit Number 15    Number of Visits 17    Date for PT Re-Evaluation 05/18/22    Authorization Type [employer]    Authorization Time Period 03-19-22 - 05-27-22    Authorization - Visit Number 15    Authorization - Number of Visits 16    PT Start Time 0849    PT Stop Time 0935    PT Time Calculation (min) 46 min    Equipment Utilized During Treatment Other (comment)   kay bench   Activity Tolerance Patient tolerated treatment well    Behavior During Therapy [facility] for tasks assessed/performed                      Past Medical History:  Diagnosis Date   Hyperlipidemia    Hypertension    Hypogonadism in male 11/01/2016   Hypothyroidism    Multiple sclerosis ([place])    Neuromuscular disorder ([place])    Proliferative diabetic retinopathy(362.02)    Type 1 diabetes mellitus ([place]) dx'd 1994   Vitamin D insufficiency 11/29/2016   Past Surgical History:  Procedure Laterality Date   EYE SURGERY Bilateral    "laser for diabetic retinopathy"   FRACTURE SURGERY     IR VENO/EXT/UNI LEFT  03/24/2020   OPEN REDUCTION INTERNAL FIXATION (ORIF) TIBIA/FIBULA FRACTURE Right 10/30/2016   ORIF ANKLE FRACTURE Left 2015   ORIF TIBIA FRACTURE Right 10/30/2016   Procedure: OPEN REDUCTION INTERNAL FIXATION (ORIF) TIBIA FIBULA FRACTURE;  Surgeon: Altamese [place], MD;  Location: [place];  Service: Orthopedics;  Laterality: Right;   RETINAL LASER PROCEDURE Bilateral    "for diabetic retinopathy"   Patient Active Problem List   Diagnosis Date Noted   PHN (postherpetic neuralgia) 03/12/2022   Acute deep vein thrombosis (DVT) of axillary vein of left upper extremity ([place]) 03/12/2022   Colon cancer screening 08/01/2021   Clinical  diagnosis of COVID-19 12/28/2020   OAB (overactive bladder) 09/21/2020   Conjunctival injection, right 09/21/2020   Essential hypertension 09/21/2020   Urinary dysfunction 09/21/2020   Thoracic outlet syndrome 05/03/2020   Controlled type 1 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 ([place]) 03/24/2020   Right epiretinal membrane 03/24/2020   Retinal exudates and deposits 03/24/2020   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   Nuclear sclerotic cataract of both eyes 03/24/2020   Gait disorder 03/23/2020   Benign prostatic hyperplasia without lower urinary tract symptoms 03/01/2020   Diabetic gastroparesis associated with type 1 diabetes mellitus ([place]) 03/01/2020   Uncontrolled type 1 diabetes mellitus with hyperglycemia ([place]) 03/01/2020   Gait disturbance 08/19/2019   Acquired diplegia ([place]) 08/19/2019   Vitamin D insufficiency 11/29/2016   Hypogonadism in male 11/01/2016   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[place])    Hypothyroidism    Hypertension    Abnormal liver function tests 03/06/2013   Type I (juvenile type) diabetes mellitus without mention of complication, uncontrolled 12/03/2007   HYPERCHOLESTEROLEMIA 11/12/2007   MULTIPLE SCLEROSIS 08/29/2007   PROLIFERATIVE DIABETIC RETINOPATHY 08/29/2007    ONSET DATE: Referral date 02-07-22  REFERRING DIAG: G35 (ICD-10-CM) - Multiple sclerosis ([place]) R26.9 (ICD-10-CM) - Gait disturbance   THERAPY DIAG:  Other abnormalities of gait and mobility  Muscle weakness (generalized)  Unsteadiness on feet  Rationale for Evaluation and Treatment Rehabilitation  SUBJECTIVE:                                                                                                                                                                                             SUBJECTIVE STATEMENT: Pt reports no changes - no falls -doing OK - understands discharge is today Pt accompanied by: self  PERTINENT  HISTORY: vitamin D insufficiency, Type 1 IDDM w/ polyneuropathy, diabetic retinopathy, hypothyroidism, HTN, hypercholestrolemia, h/o Rt tibia fracture, h/o Lt ankle fracture with ORIF, MS with acquired diplegia   PAIN:   Are you having pain? No  PRECAUTIONS: Fall  WEIGHT BEARING RESTRICTIONS: No  FALLS: Has patient fallen in last 6 months? No  LIVING ENVIRONMENT: Lives with:  lives with their partner (girlfriend) Lives in: House/apartment Stairs: Yes; External: 6 steps; on right going up, on left going up, and can reach both Has following equipment at home: Gilford Rile - 2 wheeled, Wheelchair (manual), and shower chair  PLOF: Independent with basic ADLs, Independent with household mobility with device, Independent with transfers, and Needs assistance with homemaking  PATIENT GOALS: try to increase strength in lifting each leg up from hip in seated position (hip flexors); also increase strength in lifting Lt toes off floor (dorsiflexors)   OBJECTIVE:    TODAY'S TREATMENT:   05-17-22  GAIT: Gait pattern: pt uses vaulting gait pattern in which he lifts himself up and swings bil.LE's (paraplegic gait pattern) for increased speed, decreased step length- Left, decreased hip/knee flexion- Right, decreased hip/knee flexion- Left, decreased ankle dorsiflexion- Left, and poor foot clearance- Left Distance walked: 44' from lobby to gym Assistive device utilized: Environmental consultant - 2 wheeled; pt is wearing his AFO on his RLE Level of assistance: SBA Comments: Several occurrences of Lt toes catching in swing phase of gait due to increased spasticity; pt using reciprocal gait pattern  Pt ambulates into gym from lobby requiring increased time w/ frequent stops due to spasticity; uses swing thru gait pattern for increased speed; uses reciprocal gait pattern when no one is behind him and when not in a hurry  Gait velocity:  53.53 secs with RW = .61 ft/sec  TUG score 44.22 secs with RW   THEREX:    Pt  performed tall kneeling with Kay bench in front for UE support prn Used 5# kettlebell - lifted kettlebell straight up to 90 degrees with bil. Ue's 10 reps; then diagonal patterns "X" 2 sets 5 reps each diagonal for trunk rotation strengthening  Pt performed hip flexion RLE and LLE in tall kneeling with  3# weight on each leg 10 reps x 2 sets (PT lifted each foot off mat for increased ease with exercise) Hip abduction in tall kneeling with 3# 10 reps 2 sets RLE and LLE    5x sit to stand transfers from chair without UE support = 11.00 secs    Seated bil. Knee flexion with foot on towel on floor to decrease friction and to increase ease with flexing knee - 10 reps performed each LE    PATIENT EDUCATION: Education details:  Discussed using prolonged rest, weight-bearing through LEs, and trialing alternating LE focused on when exercising to break tone cycle vs determining true muscle fatigue.   Person educated: Patient Education method: Explanation Education comprehension: verbalized understanding  HOME EXERCISE PROGRAM: [email or web address] (code from episode prior)   Access Code: [email or web address] URL: [email or web address] Date: 04/12/2022 Prepared by: Ethelene Browns  Exercises - Seated Hamstring Curl with Anchored Resistance  - 1 x daily - 7 x weekly - 1 sets - 10 reps - AND SEATED HIP ADDUCTION  - 1 x daily - 7 x weekly - 1 sets - 10 reps - 3-5 hold - Long Sitting Hamstring Stretch  - 1 x daily - 7 x weekly - 1 sets - 1-2 reps - 30-60 secs hold  GOALS: Goals reviewed with patient? Yes  SHORT TERM GOALS: Target date: 04/20/2022  Improve 5x sit to stand to </= 14 secs from standard chair with bil. UE support. Baseline:  16.97 secs; 15.56 secs Goal status: Partially met - 04-17-22  2.  Pt will stand unsupported and reach 10" outside BOS with 1 UE support for increased independence with ADL's in standing. Baseline:  Goal status: Goal met - 04-17-22  3.  Improve gait speed to >/= .7  ft/sec with RW to demo improved gait efficiency. Baseline: .35 ft/sec with RW; .63 ft/sec = 51.97 secs -- 04-23-22 Goal status: Goal not met 04-23-22  4.  Improve TUG score to </= 50 secs with RW to demo increased functional mobility. Baseline: 57.85 secs with RW; 1'16".75;   04-23-22:  49.32 secs Goal status: Goal not met 04-17-22  5.  Amb. 115' with RW on flat, even surface without requiring seated rest period.  Baseline: 49' with RW Goal status: Goal met 04-17-22  6.  Independent in HEP for stretching & strengthening. Baseline:  Goal status: Goal met 04-17-22  LONG TERM GOALS: Target date: 05/18/2022  Improve 5x sit to stand to </= 12 secs from standard chair with bil. UE support. Baseline:  16.97 secs:  11.00 secs with bil. UE support from chair  Goal status:  MET 05-17-22  2.   Pt will stand unsupported and reach 6" outside BOS without UE support for increased independence with ADL's in standing. Baseline:  Goal status: Goal met  05-17-22  3.  Improve gait speed to >/= 1.0 ft/sec with RW to demo improved gait efficiency. Baseline: .35 ft/sec with RW;  1" 1 sec;    05-17-22:    53.53 secs with RW =.61 ft/sec  Goal status: Partially met  - tone increases impacting gait speed  4.   Improve TUG score to </= 44 secs with RW to demo increased functional mobility. Baseline: 57.85 secs with RW;  44.22 secs Goal status: Goal met 05-17-22  5.  Amb. 200' with RW on flat, even surface without requiring seated rest period.  Baseline: 42' with RW Goal status: Goal met   6.  Independent in updated HEP for stretching & strengthening. Baseline:  Goal status: Goal met 05-17-22   ASSESSMENT:  CLINICAL IMPRESSION: Pt has met LTG's #1,2,4,5 and 6;  LTG #3 partially met as gait speed has increased from .35 ft/sec to .61 ft/sec but not to stated goal of 1.0 ft/sec.  Extensor tone and spasticity continue to impact mobility and ease of movement.  Pt is discharged due to completion of  program - pt has plateaued in maximizing functional progress at this time.  D/C due to goals met - authorized visits completed.     OBJECTIVE IMPAIRMENTS: Abnormal gait, decreased activity tolerance, decreased balance, decreased endurance, difficulty walking, decreased ROM, decreased strength, increased muscle spasms, and impaired tone.   ACTIVITY LIMITATIONS: carrying, bending, standing, squatting, stairs, transfers, and locomotion level  PARTICIPATION LIMITATIONS: meal prep, cleaning, laundry, driving, shopping, community activity, and yard work  PERSONAL FACTORS: Past/current experiences, Time since onset of injury/illness/exacerbation, and progressive nature of MS disease process  are also affecting patient's functional outcome.   REHAB POTENTIAL: Fair due to chronicity of deficits and progressive nature of MS disease process  CLINICAL DECISION MAKING: Stable/uncomplicated  EVALUATION COMPLEXITY: Low  PLAN:  PT FREQUENCY: 2x/week  PT DURATION: 8 weeks  PLANNED INTERVENTIONS: Therapeutic exercises, Therapeutic activity, Neuromuscular re-education, Balance training, Gait training, Patient/Family education, Self Care, Stair training, Orthotic/Fit training, and DME instructions  PLAN FOR NEXT SESSION: N/A - D/C    PHYSICAL THERAPY DISCHARGE SUMMARY  Visits from Start of Care: 15  Current functional level related to goals / functional outcomes: See above for progress towards goals   Remaining deficits: Pt continues to have bil. LE weakness and extensor tone/spasticity which impacts mobility.  Continued dependent gait with pt using RW for assistance with ambulation. Continued decreased standing balance with need for UE support for safety.   Education / Equipment: Pt has been instructed in HEP for LE strengthening and stretching - reports compliance with HEP.   Patient agrees to discharge. Patient goals were met. Patient is being discharged due to meeting the stated rehab goals.  Pt has plateaued in maximizing functional progress at this time - no further needs identified. Authorized visits completed.    Alda Lea, PT 05/18/2022, 2:56 PM

## 2022-05-18 ENCOUNTER — Encounter: Payer: Self-pay | Admitting: Physical Therapy

## 2022-05-31 ENCOUNTER — Other Ambulatory Visit: Payer: Self-pay | Admitting: Internal Medicine

## 2022-05-31 DIAGNOSIS — N3281 Overactive bladder: Secondary | ICD-10-CM

## 2022-06-04 DIAGNOSIS — E1065 Type 1 diabetes mellitus with hyperglycemia: Secondary | ICD-10-CM | POA: Diagnosis not present

## 2022-06-21 ENCOUNTER — Other Ambulatory Visit (INDEPENDENT_AMBULATORY_CARE_PROVIDER_SITE_OTHER): Payer: Medicare PPO

## 2022-06-21 DIAGNOSIS — E1065 Type 1 diabetes mellitus with hyperglycemia: Secondary | ICD-10-CM

## 2022-06-21 DIAGNOSIS — E063 Autoimmune thyroiditis: Secondary | ICD-10-CM

## 2022-06-21 DIAGNOSIS — E7800 Pure hypercholesterolemia, unspecified: Secondary | ICD-10-CM

## 2022-06-21 LAB — LIPID PANEL
Cholesterol: 142 mg/dL (ref 0–200)
HDL: 50.9 mg/dL (ref >39.00)
LDL Cholesterol: 82 mg/dL (ref 0–99)
NonHDL: 90.64
Total CHOL/HDL Ratio: 3
Triglycerides: 44 mg/dL (ref 0.0–149.0)
VLDL: 8.8 mg/dL (ref 0.0–40.0)

## 2022-06-21 LAB — HEMOGLOBIN A1C: Hgb A1c MFr Bld: 7.4 % — ABNORMAL HIGH (ref 4.6–6.5)

## 2022-06-21 LAB — COMPREHENSIVE METABOLIC PANEL
ALT: 19 U/L (ref 0–53)
AST: 21 U/L (ref 0–37)
Albumin: 4.3 g/dL (ref 3.5–5.2)
Alkaline Phosphatase: 98 U/L (ref 39–117)
BUN: 14 mg/dL (ref 6–23)
CO2: 31 mEq/L (ref 19–32)
Calcium: 9.2 mg/dL (ref 8.4–10.5)
Chloride: 101 mEq/L (ref 96–112)
Creatinine, Ser: 1.03 mg/dL (ref 0.40–1.50)
GFR: 85.08 mL/min (ref >60.00)
Glucose, Bld: 165 mg/dL — ABNORMAL HIGH (ref 70–99)
Potassium: 4.4 mEq/L (ref 3.5–5.1)
Sodium: 138 mEq/L (ref 135–145)
Total Bilirubin: 0.6 mg/dL (ref 0.2–1.2)
Total Protein: 7.2 g/dL (ref 6.0–8.3)

## 2022-06-21 LAB — TSH: TSH: 2.88 u[IU]/mL (ref 0.35–5.50)

## 2022-06-26 ENCOUNTER — Other Ambulatory Visit: Payer: Self-pay | Admitting: Endocrinology

## 2022-06-26 ENCOUNTER — Encounter: Payer: Self-pay | Admitting: Endocrinology

## 2022-06-26 ENCOUNTER — Telehealth (INDEPENDENT_AMBULATORY_CARE_PROVIDER_SITE_OTHER): Payer: Medicare PPO | Admitting: Endocrinology

## 2022-06-26 DIAGNOSIS — E1065 Type 1 diabetes mellitus with hyperglycemia: Secondary | ICD-10-CM

## 2022-06-26 DIAGNOSIS — E7800 Pure hypercholesterolemia, unspecified: Secondary | ICD-10-CM

## 2022-06-26 DIAGNOSIS — E063 Autoimmune thyroiditis: Secondary | ICD-10-CM

## 2022-06-26 NOTE — Progress Notes (Signed)
Subjective:              Patient ID: Johnathan Ross, male   DOB: Jul 07, 1970, 52 y.o.   MRN: [id number]  I connected with the above-named patient by video enabled telemedicine application and verified that I am speaking with the correct person. The patient was explained the limitations of evaluation and management by telemedicine and the availability of in person appointments.  Patient also understood that there may be a patient responsible charge related to this service  Location of the patient: Patient's home  Location of the provider: Physician office Only the patient and myself were participating in the encounter The patient understood the above statements and agreed to proceed.   Diabetes  Diagnosis: Type 1 diabetes mellitus, date of diagnosis:  1995.   Insulin Pump followup:   CURRENT insulin pump:  Medtronic 780 G  HISTORY: An insulin pump has been in use since 03/13/11.   PUMP settings    Basal rate: 12 AM- 4 AM = 0.25, 4 AM- 10 PM = 1.5,10 PM = 0.25  Carbohydrate to insulin ratio: 8 AM = 1: 7, 12 noon = 1: 8.5, 10 PM = 0.25  Sensitivity settings: 12 AM = 1: 45, 7 AM until 8 a.m. = 1: 35 Blood sugar target 130 ACTIVE insulin time 3 hours  Recent history:  His A1c is improving at 7.4   Management reviewed from guardian sensor and pump download, current history and problems identified:   He has been on the 780 pump since 9/23 His pump has been connected with the [facility]   He says that he thinks he is needing more insulin than before but this may be related to using the smart card auto correction He is also concerned that his blood sugars unexpectedly go up when eating at times even when he is eating about the same meal This is frequently related to eating fast food which he is regularly He will enter as much is 130 g of carbohydrate for fast food but occasionally still has blood sugar spikes over 300 after eating Rarely he will have a low reading  postprandial Overall however due 2 hour average blood sugar is indicated as under control, detailed in the CGM analysis Also his blood sugars appear to be rising fairly quickly despite doing some higher fat meals, despite his feeling that his blood sugars are staying up a long time in the evenings after dinner  He was told to look into Fiasp but he did not call about replacing his NovoLog Also still not waiting after his bolus to start eating causing some higher readings No significant hypoglycemia   GMI is 6.9 for the last 2 weeks   Blood sugars are mildly increased until about 2 AM and subsequently in the low 100 range with some rise in the dawn phenomenon after 5 AM  Premeal blood sugars may be mildly increased especially at dinnertime  Although he has some variability in his postprandial readings especially after lunch on an average blood sugars are the same or slightly lower compared to Premeal readings at 2 hours Only rare mild hypoglycemia twice at 12 noon Time in range is similar to his last visit, previously 81%   Data:  CGM use % of time   2-week average/GV 140/30  Time in range      81%  % Time Above 180 16  % Time above 250 2  % Time Below 70 1     PRE-MEAL Fasting  Lunch Dinner Bedtime Overall  Glucose range:       Averages:  143 167     POST-MEAL PC Breakfast PC Lunch PC Dinner  Glucose range:     Averages:  131 155      CGM use % of time 95  2-week average/GV 151+/-40  Time in range        79%  % Time Above 180 14  % Time above 250 6  % Time Below 70 1     PRE-MEAL Fasting Lunch Dinner Bedtime Overall  Glucose range:       Averages:  175 188 232/198    POST-MEAL PC Breakfast PC Lunch PC Dinner  Glucose range:     Averages:  184 182      Wt Readings from Last 3 Encounters:  03/04/22 196 lb 3.4 oz (89 kg)  03/02/22 198 lb (89.8 kg)  02/07/22 198 lb (89.8 kg)     Lab Results  Component Value Date   HGBA1C 7.4 (H) 06/21/2022   HGBA1C 7.4  (H) 02/07/2022   HGBA1C 7.4 (H) 11/06/2021   Lab Results  Component Value Date   MICROALBUR <0.7 08/28/2021   LDLCALC 82 06/21/2022   CREATININE 1.03 06/21/2022     OTHER active problems discussed today: See review of systems    Allergies as of 06/26/2022       Reactions   Lisinopril Cough   Ivp Dye [iodinated Contrast Media] Hives, Itching        Medication List        Accurate as of June 26, 2022 11:35 AM. If you have any questions, ask your nurse or doctor.          baclofen 10 MG tablet Commonly known as: LIORESAL TAKE 1 TO 2 TABLETS THREE TIMES A DAY (UP TO 6 PILLS/DAY)   Contour Next Test test strip Generic drug: glucose blood USE 2 TIMES PER DAY. E10.65   dalfampridine 10 MG Tb12 Commonly known as: Ampyra Take 1 tablet (10 mg total) by mouth 2 (two) times daily.   Dexcom G5 Receiver Kit Devi by Does not apply route.   Dexcom G6 Sensor Misc Use as directed.   Dexcom G6 Transmitter Misc Use to monitor blood sugar   gabapentin 100 MG capsule Commonly known as: NEURONTIN Take 1 capsule (100 mg total) by mouth 3 (three) times daily.   Gvoke HypoPen 2-Pack 1 MG/0.2ML Soaj Generic drug: Glucagon Inject 1 Act into the skin daily as needed.   losartan 50 MG tablet Commonly known as: COZAAR TAKE 1 TABLET BY MOUTH EVERY DAY   NovoLOG 100 UNIT/ML injection Generic drug: insulin aspart USE UP TO 80 UNITS IN INSULIN PUMP DAILY-DX CODE E10.8   OVER THE COUNTER MEDICATION Sea moss supplement-vitamin and minerals daily   silodosin 8 MG Caps capsule Commonly known as: RAPAFLO Take 1 capsule (8 mg total) by mouth daily with breakfast.   simvastatin 20 MG tablet Commonly known as: ZOCOR TAKE 1 TABLET BY MOUTH EVERYDAY AT BEDTIME   solifenacin 5 MG tablet Commonly known as: VESICARE TAKE 1 TABLET (5 MG TOTAL) BY MOUTH DAILY.   Synthroid 125 MCG tablet Generic drug: levothyroxine TAKE 1 TABLET BY MOUTH 6 DAYS PER WEEK        Allergies:   Allergies  Allergen Reactions   Lisinopril Cough   Ivp Dye [Iodinated Contrast Media] Hives and Itching    Past Medical History:  Diagnosis Date   Hyperlipidemia    Hypertension  Hypogonadism in male 11/01/2016   Hypothyroidism    Multiple sclerosis ([place])    Neuromuscular disorder ([place])    Proliferative diabetic retinopathy(362.02)    Type 1 diabetes mellitus ([place]) dx'd 1994   Vitamin D insufficiency 11/29/2016    Past Surgical History:  Procedure Laterality Date   EYE SURGERY Bilateral    "laser for diabetic retinopathy"   FRACTURE SURGERY     IR VENO/EXT/UNI LEFT  03/24/2020   OPEN REDUCTION INTERNAL FIXATION (ORIF) TIBIA/FIBULA FRACTURE Right 10/30/2016   ORIF ANKLE FRACTURE Left 2015   ORIF TIBIA FRACTURE Right 10/30/2016   Procedure: OPEN REDUCTION INTERNAL FIXATION (ORIF) TIBIA FIBULA FRACTURE;  Surgeon: Altamese [place], MD;  Location: [facility];  Service: Orthopedics;  Laterality: Right;   RETINAL LASER PROCEDURE Bilateral    "for diabetic retinopathy"    Family History  Problem Relation Age of Onset   Diabetes Sister    Colon polyps Maternal Uncle    Colon cancer Maternal Grandmother    Stomach cancer Cousin    Esophageal cancer Neg Hx     Social History:  reports that he has never smoked. He has never used smokeless tobacco. He reports that he does not drink alcohol and does not use drugs.    Review of Systems      HYPERCHOLESTEROLEMIA: He has been taking simvastatin 20 mg daily  LDL at goal  Lab Results  Component Value Date   CHOL 142 06/21/2022   HDL 50.90 06/21/2022   LDLCALC 82 06/21/2022   LDLDIRECT 65.0 02/09/2021   TRIG 44.0 06/21/2022   CHOLHDL 3 06/21/2022   Lab Results  Component Value Date   ALT 19 06/21/2022    HYPERTENSION: He had been on lisinopril with usually good control  of blood pressure  He is on losartan 50 mg daily   BP Readings from Last 3 Encounters:  03/12/22 118/80  03/04/22 137/89  03/02/22 (!) 144/89    He  has MULTIPLE sclerosis, is followed regularly by neurologist  HYPOTHYROIDISM: He has had longstanding primary 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 autoimmune  His TSH previously had been frequently variable and not clear why it fluctuates, more recently been requiring lower doses He also does not feel any different whether his thyroid level is high or low He generally has a tendency of feeling sleepy during the day  His dose has been 125 mcg, 6 days a week  He is getting brand-name Synthroid consistently and is using 90-day prescriptions  He is regular with taking his supplement before breakfast daily TSH history as follows:   Lab Results  Component Value Date   TSH 2.88 06/21/2022   TSH 2.45 11/06/2021   TSH 1.31 08/22/2021   FREET4 0.95 05/19/2021   FREET4 1.06 07/12/2020   FREET4 0.75 03/14/2020        Objective:   Physical Exam  There were no vitals taken for this visit.           Assessment:      DIABETES type I on Medtronic 780 insulin pump:   See history of present illness for detailed discussion of current diabetes management, blood sugar patterns and problems identified  A1c is 7.4 as before  Blood sugars are excellent overall but somewhat variable after meals Some of the variability related to eating fast food and not getting consistent coverage for these kinds of meals Also, may be able to benefit from bolusing a little ahead of time instead of right before eating Currently able to use the smart  card fairly consistently Recent insulin usage is about 61 units, only slightly more than before  HYPOTHYROIDISM:  TSH normal on his current regimen of 6 days a week of levothyroxine  LIPIDS: Well-controlled  HYPERTENSION: To follow-up with EP for in-house blood pressure measurement   Plan:      He will need to make sure he is bolusing a few minutes at a time especially when eating out Try to cut back on high-fat options at the fast food restaurant and add more  salads or other higher fiber meals Also discussed that this will require overall less insulin by cutting back on mealtime coverage Trial of Fiasp insulin instead of NovoLog, will see if he can get the sample to try and discussed the details of how this works as well as the differences No change in pump settings   Continue thyroid medication unchanged     There are no Patient Instructions on file for this visit.   Total visit time including counseling = 30 minutes  Kierah Goatley  Note: This office note was prepared with Estate agent. Any transcriptional errors that result from this process are unintentional.

## 2022-06-28 ENCOUNTER — Encounter: Payer: Self-pay | Admitting: Endocrinology

## 2022-06-30 ENCOUNTER — Other Ambulatory Visit: Payer: Self-pay | Admitting: Neurology

## 2022-07-02 NOTE — Telephone Encounter (Signed)
Patient last seen 02/07/2022 No 6 month follow up scheduled

## 2022-07-12 DIAGNOSIS — E1065 Type 1 diabetes mellitus with hyperglycemia: Secondary | ICD-10-CM | POA: Diagnosis not present

## 2022-07-13 ENCOUNTER — Other Ambulatory Visit: Payer: Self-pay | Admitting: Neurology

## 2022-07-13 DIAGNOSIS — R269 Unspecified abnormalities of gait and mobility: Secondary | ICD-10-CM

## 2022-07-13 DIAGNOSIS — G35 Multiple sclerosis: Secondary | ICD-10-CM

## 2022-07-23 ENCOUNTER — Other Ambulatory Visit: Payer: Self-pay | Admitting: *Deleted

## 2022-07-23 ENCOUNTER — Telehealth: Payer: Self-pay | Admitting: Neurology

## 2022-07-23 DIAGNOSIS — R269 Unspecified abnormalities of gait and mobility: Secondary | ICD-10-CM

## 2022-07-23 DIAGNOSIS — G35 Multiple sclerosis: Secondary | ICD-10-CM

## 2022-07-23 MED ORDER — DALFAMPRIDINE ER 10 MG PO TB12: 10.0000 mg | ORAL_TABLET | Freq: Two times a day (BID) | ORAL | 1 refills | Status: DC

## 2022-07-23 NOTE — Telephone Encounter (Signed)
Please call patient back, he is not able to fill this medication at local pharmacy's. It must be a [facility]. If his insurance is no longer in network, he will need to contact his insurance company and ask them what "specialty pharmacy" where his dalfampridine 10 mg can be sent. Thanks!

## 2022-07-23 NOTE — Telephone Encounter (Signed)
Please call pt, [facility] local [facility] but what will happen is they will [facility]. Also, in order to complete PA, we will need him to upload front/back of insurance cards for this year to his mychart aaccount and then send message back once he has done this

## 2022-07-23 NOTE — Telephone Encounter (Signed)
Rx e-scribed to following pharmacy per pt request:  [id number] [place] [address] [facility] [address]  Waiting on pt to upload insurance cards.

## 2022-07-23 NOTE — Telephone Encounter (Signed)
Pt scheduled appt with Dr. Felecia Shelling  12/19/22. Would like a call back to discuss mail order pharmacy for dalfampridine 10 MG TB12 not in network anymore. Where can get medication from a local pharmacy.

## 2022-08-02 ENCOUNTER — Encounter (INDEPENDENT_AMBULATORY_CARE_PROVIDER_SITE_OTHER): Payer: Medicare Other | Admitting: Ophthalmology

## 2022-08-02 ENCOUNTER — Encounter: Payer: Self-pay | Admitting: Internal Medicine

## 2022-08-02 ENCOUNTER — Ambulatory Visit (INDEPENDENT_AMBULATORY_CARE_PROVIDER_SITE_OTHER): Payer: Medicare PPO | Admitting: Internal Medicine

## 2022-08-02 VITALS — BP 136/82 | HR 100 | Temp 98.9°F | Resp 16 | Ht 73.0 in | Wt 197.0 lb

## 2022-08-02 DIAGNOSIS — Z0001 Encounter for general adult medical examination with abnormal findings: Secondary | ICD-10-CM | POA: Diagnosis not present

## 2022-08-02 DIAGNOSIS — Z125 Encounter for screening for malignant neoplasm of prostate: Secondary | ICD-10-CM | POA: Diagnosis not present

## 2022-08-02 DIAGNOSIS — R2241 Localized swelling, mass and lump, right lower limb: Secondary | ICD-10-CM | POA: Diagnosis not present

## 2022-08-02 DIAGNOSIS — E1042 Type 1 diabetes mellitus with diabetic polyneuropathy: Secondary | ICD-10-CM

## 2022-08-02 DIAGNOSIS — R0981 Nasal congestion: Secondary | ICD-10-CM

## 2022-08-02 DIAGNOSIS — I10 Essential (primary) hypertension: Secondary | ICD-10-CM

## 2022-08-02 DIAGNOSIS — R39198 Other difficulties with micturition: Secondary | ICD-10-CM

## 2022-08-02 DIAGNOSIS — Z23 Encounter for immunization: Secondary | ICD-10-CM | POA: Diagnosis not present

## 2022-08-02 DIAGNOSIS — E039 Hypothyroidism, unspecified: Secondary | ICD-10-CM | POA: Diagnosis not present

## 2022-08-02 DIAGNOSIS — R1312 Dysphagia, oropharyngeal phase: Secondary | ICD-10-CM | POA: Diagnosis not present

## 2022-08-02 LAB — PSA: PSA: 1.11 ng/mL (ref 0.10–4.00)

## 2022-08-02 MED ORDER — LEVOCETIRIZINE DIHYDROCHLORIDE 5 MG PO TABS: 5.0000 mg | ORAL_TABLET | Freq: Every evening | ORAL | 1 refills | Status: DC

## 2022-08-02 NOTE — Patient Instructions (Signed)
Health Maintenance, Male Adopting a healthy lifestyle and getting preventive care are important in promoting health and wellness. Ask your health care provider about: The right schedule for you to have regular tests and exams. Things you can do on your own to prevent diseases and keep yourself healthy. What should I know about diet, weight, and exercise? Eat a healthy diet  Eat a diet that includes plenty of vegetables, fruits, low-fat dairy products, and lean protein. Do not eat a lot of foods that are high in solid fats, added sugars, or sodium. Maintain a healthy weight Body mass index (BMI) is a measurement that can be used to identify possible weight problems. It estimates body fat based on height and weight. Your health care provider can help determine your BMI and help you achieve or maintain a healthy weight. Get regular exercise Get regular exercise. This is one of the most important things you can do for your health. Most adults should: Exercise for at least 150 minutes each week. The exercise should increase your heart rate and make you sweat (moderate-intensity exercise). Do strengthening exercises at least twice a week. This is in addition to the moderate-intensity exercise. Spend less time sitting. Even light physical activity can be beneficial. Watch cholesterol and blood lipids Have your blood tested for lipids and cholesterol at 50 years of age, then have this test every 5 years. You may need to have your cholesterol levels checked more often if: Your lipid or cholesterol levels are high. You are older than 50 years of age. You are at high risk for heart disease. What should I know about cancer screening? Many types of cancers can be detected early and may often be prevented. Depending on your health history and family history, you may need to have cancer screening at various ages. This may include screening for: Colorectal cancer. Prostate cancer. Skin cancer. Lung  cancer. What should I know about heart disease, diabetes, and high blood pressure? Blood pressure and heart disease High blood pressure causes heart disease and increases the risk of stroke. This is more likely to develop in people who have high blood pressure readings or are overweight. Talk with your health care provider about your target blood pressure readings. Have your blood pressure checked: Every 3-5 years if you are 18-39 years of age. Every year if you are 40 years old or older. If you are between the ages of 65 and 75 and are a current or former smoker, ask your health care provider if you should have a one-time screening for abdominal aortic aneurysm (AAA). Diabetes Have regular diabetes screenings. This checks your fasting blood sugar level. Have the screening done: Once every three years after age 45 if you are at a normal weight and have a low risk for diabetes. More often and at a younger age if you are overweight or have a high risk for diabetes. What should I know about preventing infection? Hepatitis B If you have a higher risk for hepatitis B, you should be screened for this virus. Talk with your health care provider to find out if you are at risk for hepatitis B infection. Hepatitis C Blood testing is recommended for: Everyone born from 1945 through 1965. Anyone with known risk factors for hepatitis C. Sexually transmitted infections (STIs) You should be screened each year for STIs, including gonorrhea and chlamydia, if: You are sexually active and are younger than 50 years of age. You are older than 50 years of age and your   health care provider tells you that you are at risk for this type of infection. Your sexual activity has changed since you were last screened, and you are at increased risk for chlamydia or gonorrhea. Ask your health care provider if you are at risk. Ask your health care provider about whether you are at high risk for HIV. Your health care provider  may recommend a prescription medicine to help prevent HIV infection. If you choose to take medicine to prevent HIV, you should first get tested for HIV. You should then be tested every 3 months for as long as you are taking the medicine. Follow these instructions at home: Alcohol use Do not drink alcohol if your health care provider tells you not to drink. If you drink alcohol: Limit how much you have to 0-2 drinks a day. Know how much alcohol is in your drink. In the [place] one drink equals one 12 oz bottle of beer (355 mL), one 5 oz glass of wine (148 mL), or one 1 oz glass of hard liquor (44 mL). Lifestyle Do not use any products that contain nicotine or tobacco. These products include cigarettes, chewing tobacco, and vaping devices, such as e-cigarettes. If you need help quitting, ask your health care provider. Do not use street drugs. Do not share needles. Ask your health care provider for help if you need support or information about quitting drugs. General instructions Schedule regular health, dental, and eye exams. Stay current with your vaccines. Tell your health care provider if: You often feel depressed. You have ever been abused or do not feel safe at home. Summary Adopting a healthy lifestyle and getting preventive care are important in promoting health and wellness. Follow your health care provider's instructions about healthy diet, exercising, and getting tested or screened for diseases. Follow your health care provider's instructions on monitoring your cholesterol and blood pressure. This information is not intended to replace advice given to you by your health care provider. Make sure you discuss any questions you have with your health care provider. Document Revised: 10/03/2020 Document Reviewed: 10/03/2020 Elsevier Patient Education  2023 [employer]  

## 2022-08-02 NOTE — Progress Notes (Signed)
Subjective:  Patient ID: Johnathan Ross, male    DOB: [birth year]  Age: 52 y.o. MRN: [id number]  CC: Annual Exam, Hypertension, Hyperlipidemia, and Diabetes   HPI Johnathan Ross presents for a CPX and f/up ----  He complains of a 1 year history of enlarging mass under the skin over his right upper posterior thigh.  He also complains of dysphagia but denies odynophagia, weight loss, or loss of appetite.  He complains of arthralgias but has not noticed any joint swelling or redness.  Outpatient Medications Prior to Visit  Medication Sig Dispense Refill   baclofen (LIORESAL) 10 MG tablet TAKE 1 TO 2 TABLETS THREE TIMES A DAY (UP TO 6 PILLS/DAY) 540 tablet 0   Continuous Blood Gluc Receiver (DEXCOM G5 RECEIVER KIT) DEVI by Does not apply route.     Continuous Blood Gluc Sensor (DEXCOM G6 SENSOR) MISC Use as directed. 12 each 1   Continuous Blood Gluc Transmit (DEXCOM G6 TRANSMITTER) MISC Use to monitor blood sugar 1 each 0   dalfampridine 10 MG TB12 Take 1 tablet (10 mg total) by mouth 2 (two) times daily. 180 tablet 1   gabapentin (NEURONTIN) 100 MG capsule Take 1 capsule (100 mg total) by mouth 3 (three) times daily. 90 capsule 5   Glucagon (GVOKE HYPOPEN 2-PACK) 1 MG/0.2ML SOAJ Inject 1 Act into the skin daily as needed. 2 mL 5   glucose blood (CONTOUR NEXT TEST) test strip USE 2 TIMES PER DAY. E10.65 200 strip 3   insulin aspart (NOVOLOG) 100 UNIT/ML injection USE UP TO 80 UNITS IN INSULIN PUMP DAILY-DX CODE E10.8 70 mL 2   losartan (COZAAR) 50 MG tablet TAKE 1 TABLET BY MOUTH EVERY DAY 90 tablet 3   OVER THE COUNTER MEDICATION Sea moss supplement-vitamin and minerals daily     silodosin (RAPAFLO) 8 MG CAPS capsule Take 1 capsule (8 mg total) by mouth daily with breakfast. 30 capsule 11   simvastatin (ZOCOR) 20 MG tablet TAKE 1 TABLET BY MOUTH EVERYDAY AT BEDTIME 90 tablet 3   solifenacin (VESICARE) 5 MG tablet TAKE 1 TABLET (5 MG TOTAL) BY MOUTH DAILY. 90 tablet 0   SYNTHROID 125 MCG  tablet TAKE 1 TABLET BY MOUTH 6 DAYS PER WEEK 72 tablet 1   No facility-administered medications prior to visit.    ROS Review of Systems  Constitutional: Negative.  Negative for diaphoresis, fatigue and unexpected weight change.  HENT:  Positive for congestion and trouble swallowing. Negative for facial swelling, nosebleeds, postnasal drip, rhinorrhea, sinus pressure, sinus pain, sneezing, sore throat and voice change.   Eyes: Negative.   Respiratory:  Negative for chest tightness, shortness of breath and wheezing.   Cardiovascular:  Negative for chest pain, palpitations and leg swelling.  Gastrointestinal:  Negative for abdominal pain, constipation, diarrhea and vomiting.  Endocrine: Negative.   Genitourinary: Negative.  Negative for difficulty urinating.  Musculoskeletal:  Positive for arthralgias and gait problem. Negative for back pain and myalgias.  Skin: Negative.   Neurological:  Positive for weakness and numbness. Negative for dizziness and light-headedness.  Hematological:  Negative for adenopathy. Does not bruise/bleed easily.  Psychiatric/Behavioral: Negative.      Objective:  BP 136/82 (BP Location: Left Arm, Patient Position: Sitting, Cuff Size: Normal)   Pulse 100   Temp 98.9 F (37.2 C) (Oral)   Resp 16   Ht '6\' 1"'$  (1.854 m)   Wt 197 lb (89.4 kg)   SpO2 99%   BMI 25.99 kg/m  BP Readings from Last 3 Encounters:  08/02/22 136/82  03/12/22 118/80  03/04/22 137/89    Wt Readings from Last 3 Encounters:  08/02/22 197 lb (89.4 kg)  03/04/22 196 lb 3.4 oz (89 kg)  03/02/22 198 lb (89.8 kg)    Physical Exam Vitals reviewed.  HENT:     Nose: Mucosal edema and congestion present. No rhinorrhea.     Right Nostril: No epistaxis.     Left Nostril: No foreign body or epistaxis.  Eyes:     General: No scleral icterus.    Conjunctiva/sclera: Conjunctivae normal.  Cardiovascular:     Rate and Rhythm: Normal rate and regular rhythm.     Heart sounds: No murmur  heard. Pulmonary:     Effort: Pulmonary effort is normal.     Breath sounds: No stridor. No wheezing, rhonchi or rales.  Abdominal:     General: Abdomen is flat.     Palpations: There is no mass.     Tenderness: There is no abdominal tenderness. There is no guarding.     Hernia: No hernia is present.  Genitourinary:    Comments: He was not able to position himself for a DRE. Musculoskeletal:        General: Normal range of motion.     Cervical back: Neck supple.     Right lower leg: No edema.     Left lower leg: No edema.     Comments: Right proximal posterior mid thigh there is a 3 cm subcutaneous mass with areas of squishiness and areas that are hard.  The overlying skin is normal.  Lymphadenopathy:     Cervical: No cervical adenopathy.  Skin:    General: Skin is warm and dry.  Neurological:     Mental Status: He is alert. Mental status is at baseline.  Psychiatric:        Mood and Affect: Mood normal.        Behavior: Behavior normal.     Lab Results  Component Value Date   WBC 7.0 03/02/2022   HGB 14.9 03/02/2022   HCT 44.3 03/02/2022   PLT 173 03/02/2022   GLUCOSE 165 (H) 06/21/2022   CHOL 142 06/21/2022   TRIG 44.0 06/21/2022   HDL 50.90 06/21/2022   LDLDIRECT 65.0 02/09/2021   LDLCALC 82 06/21/2022   ALT 19 06/21/2022   AST 21 06/21/2022   NA 138 06/21/2022   K 4.4 06/21/2022   CL 101 06/21/2022   CREATININE 1.03 06/21/2022   BUN 14 06/21/2022   CO2 31 06/21/2022   TSH 2.88 06/21/2022   PSA 1.11 08/02/2022   HGBA1C 7.4 (H) 06/21/2022   MICROALBUR <0.7 08/28/2021    No results found.  Assessment & Plan:   Jaydeen was seen today for annual exam, hypertension, hyperlipidemia and diabetes.  Diagnoses and all orders for this visit:  Screening for prostate cancer -     PSA; Future -     PSA  Mass of skin of right lower extremity -     Ambulatory referral to Plastic Surgery  Oropharyngeal dysphagia -     Ambulatory referral to  Gastroenterology  Nasal congestion -     levocetirizine (XYZAL) 5 MG tablet; Take 1 tablet (5 mg total) by mouth every evening.  Encounter for general adult medical examination with abnormal findings- Exam completed, labs reviewed, vaccines reviewed and updated, cancer screenings are up-to-date, patient education was given.  Need for vaccination -     Flu Vaccine QUAD  28moIM (Fluarix, Fluzone & Alfiuria Quad PF)  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[place]- His blood sugar has been well-controlled. -     HM Diabetes Foot Exam  Primary hypertension- His blood pressure is well-controlled.  Hypothyroidism, unspecified type- He is euthyroid.  Urinary dysfunction   I am having Caelen E. GCarlota Raspberrystart on levocetirizine. I am also having him maintain his Dexcom G5 Receiver Kit, Dexcom G6 Sensor, Dexcom G6 Transmitter, Gvoke HypoPen 2-Pack, Contour Next Test, OVER THE COUNTER MEDICATION, simvastatin, silodosin, gabapentin, NovoLOG, Synthroid, solifenacin, losartan, baclofen, and dalfampridine.  Meds ordered this encounter  Medications   levocetirizine (XYZAL) 5 MG tablet    Sig: Take 1 tablet (5 mg total) by mouth every evening.    Dispense:  90 tablet    Refill:  1     Follow-up: Return in about 6 months (around 02/02/2023).  TScarlette Calico MD

## 2022-08-04 DIAGNOSIS — E1065 Type 1 diabetes mellitus with hyperglycemia: Secondary | ICD-10-CM | POA: Diagnosis not present

## 2022-08-07 ENCOUNTER — Encounter: Payer: Self-pay | Admitting: Endocrinology

## 2022-08-07 DIAGNOSIS — H2513 Age-related nuclear cataract, bilateral: Secondary | ICD-10-CM | POA: Diagnosis not present

## 2022-08-07 DIAGNOSIS — E103553 Type 1 diabetes mellitus with stable proliferative diabetic retinopathy, bilateral: Secondary | ICD-10-CM | POA: Diagnosis not present

## 2022-08-07 DIAGNOSIS — H35371 Puckering of macula, right eye: Secondary | ICD-10-CM | POA: Diagnosis not present

## 2022-08-07 LAB — HM DIABETES EYE EXAM

## 2022-08-13 DIAGNOSIS — E1065 Type 1 diabetes mellitus with hyperglycemia: Secondary | ICD-10-CM | POA: Diagnosis not present

## 2022-08-20 DIAGNOSIS — H18831 Recurrent erosion of cornea, right eye: Secondary | ICD-10-CM | POA: Diagnosis not present

## 2022-08-20 DIAGNOSIS — H0288A Meibomian gland dysfunction right eye, upper and lower eyelids: Secondary | ICD-10-CM | POA: Diagnosis not present

## 2022-08-20 DIAGNOSIS — S0500XA Injury of conjunctiva and corneal abrasion without foreign body, unspecified eye, initial encounter: Secondary | ICD-10-CM | POA: Diagnosis not present

## 2022-08-20 DIAGNOSIS — H0288B Meibomian gland dysfunction left eye, upper and lower eyelids: Secondary | ICD-10-CM | POA: Diagnosis not present

## 2022-08-23 DIAGNOSIS — S0500XA Injury of conjunctiva and corneal abrasion without foreign body, unspecified eye, initial encounter: Secondary | ICD-10-CM | POA: Diagnosis not present

## 2022-08-23 DIAGNOSIS — H0288B Meibomian gland dysfunction left eye, upper and lower eyelids: Secondary | ICD-10-CM | POA: Diagnosis not present

## 2022-08-23 DIAGNOSIS — H18831 Recurrent erosion of cornea, right eye: Secondary | ICD-10-CM | POA: Diagnosis not present

## 2022-08-23 DIAGNOSIS — H0288A Meibomian gland dysfunction right eye, upper and lower eyelids: Secondary | ICD-10-CM | POA: Diagnosis not present

## 2022-08-27 ENCOUNTER — Other Ambulatory Visit: Payer: Self-pay | Admitting: Internal Medicine

## 2022-08-27 DIAGNOSIS — N3281 Overactive bladder: Secondary | ICD-10-CM

## 2022-09-06 ENCOUNTER — Telehealth: Payer: Self-pay

## 2022-09-06 NOTE — Telephone Encounter (Signed)
Called patient to schedule AWV.  Patient states the he never received his lab results from his last visit. I advised patient I would send a note to clinical staff and someone would contact him. Please call patient with results.

## 2022-09-10 ENCOUNTER — Ambulatory Visit (INDEPENDENT_AMBULATORY_CARE_PROVIDER_SITE_OTHER): Payer: Medicare PPO

## 2022-09-10 VITALS — Ht 73.0 in | Wt 198.0 lb

## 2022-09-10 DIAGNOSIS — Z0000 Encounter for general adult medical examination without abnormal findings: Secondary | ICD-10-CM | POA: Diagnosis not present

## 2022-09-10 NOTE — Patient Instructions (Signed)
Mr. Johnathan Ross , Thank you for taking time to come for your Medicare Wellness Visit. I appreciate your ongoing commitment to your health goals. Please review the following plan we discussed and let me know if I can assist you in the future.   These are the goals we discussed:  Goals      Patient Stated     09/10/2022, wants to build strength in ankles     To get off this walker.        This is a list of the screening recommended for you and due dates:  Health Maintenance  Topic Date Due   HIV Screening  Never done   Zoster (Shingles) Vaccine (1 of 2) Never done   COVID-19 Vaccine (3 - Moderna risk series) 03/27/2020   Yearly kidney health urinalysis for diabetes  08/29/2022   Hemoglobin A1C  12/20/2022   Flu Shot  12/27/2022   Yearly kidney function blood test for diabetes  06/22/2023   Complete foot exam   08/02/2023   Eye exam for diabetics  08/07/2023   Medicare Annual Wellness Visit  09/10/2023   Colon Cancer Screening  11/23/2031   Hepatitis C Screening: USPSTF Recommendation to screen - Ages 18-79 yo.  Completed   HPV Vaccine  Aged Out   DTaP/Tdap/Td vaccine  Discontinued    Advanced directives: Advance directive discussed with you today.   Conditions/risks identified: none  Next appointment: Follow up in one year for your annual wellness visit   Preventive Care 40-64 Years, Male Preventive care refers to lifestyle choices and visits with your health care provider that can promote health and wellness. What does preventive care include? A yearly physical exam. This is also called an annual well check. Dental exams once or twice a year. Routine eye exams. Ask your health care provider how often you should have your eyes checked. Personal lifestyle choices, including: Daily care of your teeth and gums. Regular physical activity. Eating a healthy diet. Avoiding tobacco and drug use. Limiting alcohol use. Practicing safe sex. Taking low-dose aspirin every day starting  at age 103. What happens during an annual well check? The services and screenings done by your health care provider during your annual well check will depend on your age, overall health, lifestyle risk factors, and family history of disease. Counseling  Your health care provider may ask you questions about your: Alcohol use. Tobacco use. Drug use. Emotional well-being. Home and relationship well-being. Sexual activity. Eating habits. Work and work Astronomer. Screening  You may have the following tests or measurements: Height, weight, and BMI. Blood pressure. Lipid and cholesterol levels. These may be checked every 5 years, or more frequently if you are over 1 years old. Skin check. Lung cancer screening. You may have this screening every year starting at age 36 if you have a 30-pack-year history of smoking and currently smoke or have quit within the past 15 years. Fecal occult blood test (FOBT) of the stool. You may have this test every year starting at age 59. Flexible sigmoidoscopy or colonoscopy. You may have a sigmoidoscopy every 5 years or a colonoscopy every 10 years starting at age 38. Prostate cancer screening. Recommendations will vary depending on your family history and other risks. Hepatitis C blood test. Hepatitis B blood test. Sexually transmitted disease (STD) testing. Diabetes screening. This is done by checking your blood sugar (glucose) after you have not eaten for a while (fasting). You may have this done every 1-3 years. Discuss your test  results, treatment options, and if necessary, the need for more tests with your health care provider. Vaccines  Your health care provider may recommend certain vaccines, such as: Influenza vaccine. This is recommended every year. Tetanus, diphtheria, and acellular pertussis (Tdap, Td) vaccine. You may need a Td booster every 10 years. Zoster vaccine. You may need this after age 81. Pneumococcal 13-valent conjugate (PCV13)  vaccine. You may need this if you have certain conditions and have not been vaccinated. Pneumococcal polysaccharide (PPSV23) vaccine. You may need one or two doses if you smoke cigarettes or if you have certain conditions. Talk to your health care provider about which screenings and vaccines you need and how often you need them. This information is not intended to replace advice given to you by your health care provider. Make sure you discuss any questions you have with your health care provider. Document Released: 06/10/2015 Document Revised: 02/01/2016 Document Reviewed: 03/15/2015 Elsevier Interactive Patient Education  2017 [employer].  Fall Prevention in the Home Falls can cause injuries. They can happen to people of all ages. There are many things you can do to make your home safe and to help prevent falls. What can I do on the outside of my home? Regularly fix the edges of walkways and driveways and fix any cracks. Remove anything that might make you trip as you walk through a door, such as a raised step or threshold. Trim any bushes or trees on the path to your home. Use bright outdoor lighting. Clear any walking paths of anything that might make someone trip, such as rocks or tools. Regularly check to see if handrails are loose or broken. Make sure that both sides of any steps have handrails. Any raised decks and porches should have guardrails on the edges. Have any leaves, snow, or ice cleared regularly. Use sand or salt on walking paths during winter. Clean up any spills in your garage right away. This includes oil or grease spills. What can I do in the bathroom? Use night lights. Install grab bars by the toilet and in the tub and shower. Do not use towel bars as grab bars. Use non-skid mats or decals in the tub or shower. If you need to sit down in the shower, use a plastic, non-slip stool. Keep the floor dry. Clean up any water that spills on the floor as soon as it  happens. Remove soap buildup in the tub or shower regularly. Attach bath mats securely with double-sided non-slip rug tape. Do not have throw rugs and other things on the floor that can make you trip. What can I do in the bedroom? Use night lights. Make sure that you have a light by your bed that is easy to reach. Do not use any sheets or blankets that are too big for your bed. They should not hang down onto the floor. Have a firm chair that has side arms. You can use this for support while you get dressed. Do not have throw rugs and other things on the floor that can make you trip. What can I do in the kitchen? Clean up any spills right away. Avoid walking on wet floors. Keep items that you use a lot in easy-to-reach places. If you need to reach something above you, use a strong step stool that has a grab bar. Keep electrical cords out of the way. Do not use floor polish or wax that makes floors slippery. If you must use wax, use non-skid floor wax. Do  not have throw rugs and other things on the floor that can make you trip. What can I do with my stairs? Do not leave any items on the stairs. Make sure that there are handrails on both sides of the stairs and use them. Fix handrails that are broken or loose. Make sure that handrails are as long as the stairways. Check any carpeting to make sure that it is firmly attached to the stairs. Fix any carpet that is loose or worn. Avoid having throw rugs at the top or bottom of the stairs. If you do have throw rugs, attach them to the floor with carpet tape. Make sure that you have a light switch at the top of the stairs and the bottom of the stairs. If you do not have them, ask someone to add them for you. What else can I do to help prevent falls? Wear shoes that: Do not have high heels. Have rubber bottoms. Are comfortable and fit you well. Are closed at the toe. Do not wear sandals. If you use a stepladder: Make sure that it is fully opened.  Do not climb a closed stepladder. Make sure that both sides of the stepladder are locked into place. Ask someone to hold it for you, if possible. Clearly mark and make sure that you can see: Any grab bars or handrails. First and last steps. Where the edge of each step is. Use tools that help you move around (mobility aids) if they are needed. These include: Canes. Walkers. Scooters. Crutches. Turn on the lights when you go into a dark area. Replace any light bulbs as soon as they burn out. Set up your furniture so you have a clear path. Avoid moving your furniture around. If any of your floors are uneven, fix them. If there are any pets around you, be aware of where they are. Review your medicines with your doctor. Some medicines can make you feel dizzy. This can increase your chance of falling. Ask your doctor what other things that you can do to help prevent falls. This information is not intended to replace advice given to you by your health care provider. Make sure you discuss any questions you have with your health care provider. Document Released: 03/10/2009 Document Revised: 10/20/2015 Document Reviewed: 06/18/2014 Elsevier Interactive Patient Education  2017 [employer].

## 2022-09-10 NOTE — Progress Notes (Signed)
I connected with  [name] on 09/10/22 by a audio enabled telemedicine application and verified that I am speaking with the correct person using two identifiers.  Patient Location: Home  Provider Location: Office/Clinic  I discussed the limitations of evaluation and management by telemedicine. The patient expressed understanding and agreed to proceed.  Subjective:   Johnathan Ross is a 52 y.o. male who presents for Medicare Annual/Subsequent preventive examination.  Review of Systems     Cardiac Risk Factors include: advanced age (>74men, >52 women);diabetes mellitus;dyslipidemia;hypertension;male gender     Objective:    Today's Vitals   09/10/22 0920  Weight: 198 lb (89.8 kg)  Height: 6\' 1"  (1.854 m)   Body mass index is 26.12 kg/m.     09/10/2022    9:25 AM 03/19/2022    6:53 PM 03/04/2022   10:04 AM 03/02/2022    6:49 PM 09/07/2021    3:29 PM 08/14/2021   10:40 AM 08/03/2020    8:47 AM  Advanced Directives  Does Patient Have a Medical Advance Directive? No No No No No No No  Would patient like information on creating a medical advance directive?  No - Patient declined No - Patient declined  No - Patient declined No - Patient declined No - Patient declined    Current Medications (verified) Outpatient Encounter Medications as of 09/10/2022  Medication Sig   baclofen (LIORESAL) 10 MG tablet TAKE 1 TO 2 TABLETS THREE TIMES A DAY (UP TO 6 PILLS/DAY)   Continuous Blood Gluc Receiver (DEXCOM G5 RECEIVER KIT) DEVI by Does not apply route.   Continuous Blood Gluc Sensor (DEXCOM G6 SENSOR) MISC Use as directed.   Continuous Blood Gluc Transmit (DEXCOM G6 TRANSMITTER) MISC Use to monitor blood sugar   dalfampridine 10 MG TB12 Take 1 tablet (10 mg total) by mouth 2 (two) times daily.   gabapentin (NEURONTIN) 100 MG capsule Take 1 capsule (100 mg total) by mouth 3 (three) times daily.   Glucagon (GVOKE HYPOPEN 2-PACK) 1 MG/0.2ML SOAJ Inject 1 Act into the skin daily as  needed.   glucose blood (CONTOUR NEXT TEST) test strip USE 2 TIMES PER DAY. E10.65   insulin aspart (NOVOLOG) 100 UNIT/ML injection USE UP TO 80 UNITS IN INSULIN PUMP DAILY-DX CODE E10.8   levocetirizine (XYZAL) 5 MG tablet Take 1 tablet (5 mg total) by mouth every evening.   losartan (COZAAR) 50 MG tablet TAKE 1 TABLET BY MOUTH EVERY DAY   silodosin (RAPAFLO) 8 MG CAPS capsule Take 1 capsule (8 mg total) by mouth daily with breakfast.   simvastatin (ZOCOR) 20 MG tablet TAKE 1 TABLET BY MOUTH EVERYDAY AT BEDTIME   SYNTHROID 125 MCG tablet TAKE 1 TABLET BY MOUTH 6 DAYS PER WEEK   OVER THE COUNTER MEDICATION Sea moss supplement-vitamin and minerals daily (Patient not taking: Reported on 09/10/2022)   solifenacin (VESICARE) 5 MG tablet TAKE 1 TABLET (5 MG TOTAL) BY MOUTH DAILY. (Patient not taking: Reported on 09/10/2022)   No facility-administered encounter medications on file as of 09/10/2022.    Allergies (verified) Lisinopril and Ivp dye [iodinated contrast media]   History: Past Medical History:  Diagnosis Date   Hyperlipidemia    Hypertension    Hypogonadism in male 11/01/2016   Hypothyroidism    Multiple sclerosis    Neuromuscular disorder    Proliferative diabetic retinopathy(362.02)    Type 1 diabetes mellitus dx'd 1994   Vitamin D insufficiency 11/29/2016   Past Surgical History:  Procedure Laterality Date  EYE SURGERY Bilateral    "laser for diabetic retinopathy"   FRACTURE SURGERY     IR VENO/EXT/UNI LEFT  03/24/2020   OPEN REDUCTION INTERNAL FIXATION (ORIF) TIBIA/FIBULA FRACTURE Right 10/30/2016   ORIF ANKLE FRACTURE Left 2015   ORIF TIBIA FRACTURE Right 10/30/2016   Procedure: OPEN REDUCTION INTERNAL FIXATION (ORIF) TIBIA FIBULA FRACTURE;  Surgeon: Myrene Galas, MD;  Location: [facility] OR;  Service: Orthopedics;  Laterality: Right;   RETINAL LASER PROCEDURE Bilateral    "for diabetic retinopathy"   Family History  Problem Relation Age of Onset   Diabetes Sister     Colon polyps Maternal Uncle    Colon cancer Maternal Grandmother    Stomach cancer Cousin    Esophageal cancer Neg Hx    Social History   Socioeconomic History   Marital status: Single    Spouse name: 12   Number of children: 1   Years of education: Not on file   Highest education level: Not on file  Occupational History   Occupation: unemployed  Tobacco Use   Smoking status: Never   Smokeless tobacco: Never  Vaping Use   Vaping Use: Never used  Substance and Sexual Activity   Alcohol use: No   Drug use: No   Sexual activity: Not on file  Other Topics Concern   Not on file  Social History Narrative   Right handed   No caffeine    Lives with girlfriend   Social Determinants of Health   Financial Resource Strain: Low Risk  (09/10/2022)   Overall Financial Resource Strain (CARDIA)    Difficulty of Paying Living Expenses: Not hard at all  Food Insecurity: No Food Insecurity (09/10/2022)   Hunger Vital Sign    Worried About Running Out of Food in the Last Year: Never true    Ran Out of Food in the Last Year: Never true  Transportation Needs: No Transportation Needs (09/10/2022)   PRAPARE - Administrator, Civil Service (Medical): No    Lack of Transportation (Non-Medical): No  Physical Activity: Sufficiently Active (09/10/2022)   Exercise Vital Sign    Days of Exercise per Week: 5 days    Minutes of Exercise per Session: 60 min  Stress: No Stress Concern Present (09/10/2022)   [employer] of Occupational Health - Occupational Stress Questionnaire    Feeling of Stress : Not at all  Social Connections: Moderately Isolated (09/07/2021)   Social Connection and Isolation Panel [NHANES]    Frequency of Communication with Friends and Family: More than three times a week    Frequency of Social Gatherings with Friends and Family: More than three times a week    Attends Religious Services: Never    Database administrator or Organizations: No    Attends Museum/gallery exhibitions officer: Never    Marital Status: Living with partner    Tobacco Counseling Counseling given: Not Answered   Clinical Intake:  Pre-visit preparation completed: Yes  Pain : No/denies pain     Nutritional Status: BMI 25 -29 Overweight Nutritional Risks: None Diabetes: Yes  How often do you need to have someone help you when you read instructions, pamphlets, or other written materials from your doctor or pharmacy?: 1 - Never  Diabetic? Yes Nutrition Risk Assessment:  Has the patient had any N/V/D within the last 2 months?  No  Does the patient have any non-healing wounds?  No  Has the patient had any unintentional weight loss or weight gain?  No   Diabetes:  Is the patient diabetic?  Yes  If diabetic, was a CBG obtained today?  No  Did the patient bring in their glucometer from home?  No  How often do you monitor your CBG's? frequently.   Financial Strains and Diabetes Management:  Are you having any financial strains with the device, your supplies or your medication? No .  Does the patient want to be seen by Chronic Care Management for management of their diabetes?  No  Would the patient like to be referred to a Nutritionist or for Diabetic Management?  No   Diabetic Exams:  Diabetic Eye Exam: Completed 08/07/2022 Diabetic Foot Exam: Completed 08/02/2022   Interpreter Needed?: No  Information entered by :: NAllen LPN   Activities of Daily Living    09/10/2022    9:26 AM  In your present state of health, do you have any difficulty performing the following activities:  Hearing? 0  Vision? 0  Difficulty concentrating or making decisions? 0  Walking or climbing stairs? 0  Dressing or bathing? 0  Doing errands, shopping? 0  Preparing Food and eating ? N  Using the Toilet? N  In the past six months, have you accidently leaked urine? N  Do you have problems with loss of bowel control? N  Managing your Medications? N  Managing your Finances? N   Housekeeping or managing your Housekeeping? N    Patient Care Team: Etta Grandchild, MD as PCP - General (Internal Medicine) Adalberto Cole., MD as Referring Physician (Neurology)  Indicate any recent Medical Services you may have received from other than [facility] providers in the past year (date may be approximate).     Assessment:   This is a routine wellness examination for Andreus.  Hearing/Vision screen Vision Screening - Comments:: Regular eye exams, [facility]  Dietary issues and exercise activities discussed: Current Exercise Habits: Home exercise routine, Type of exercise: strength training/weights, Time (Minutes): 60, Frequency (Times/Week): 5, Weekly Exercise (Minutes/Week): 300   Goals Addressed             This Visit's Progress    Patient Stated       09/10/2022, wants to build strength in ankles       Depression Screen    09/10/2022    9:26 AM 08/02/2022    8:08 AM 08/01/2021    8:43 AM 09/21/2020    8:53 AM 02/11/2019    9:54 AM  PHQ 2/9 Scores  PHQ - 2 Score 0 0 0 0 0    Fall Risk    09/10/2022    9:26 AM 08/02/2022    8:08 AM 09/07/2021    3:30 PM 08/01/2021    8:43 AM  Fall Risk   Falls in the past year? 0 0 0 0  Number falls in past yr: 0 0 0   Injury with Fall? 0 0 0   Risk for fall due to : Medication side effect;Impaired mobility;Impaired balance/gait No Fall Risks No Fall Risks   Follow up Falls prevention discussed;Education provided;Falls evaluation completed Falls evaluation completed Falls evaluation completed     FALL RISK PREVENTION PERTAINING TO THE HOME:  Any stairs in or around the home? Yes  If so, are there any without handrails? No  Home free of loose throw rugs in walkways, pet beds, electrical cords, etc? Yes  Adequate lighting in your home to reduce risk of falls? Yes   ASSISTIVE DEVICES UTILIZED TO PREVENT FALLS:  Life alert? No  Use of a cane, walker or w/c? Yes  Grab bars in the bathroom? No  Shower chair or bench in  shower? No  Elevated toilet seat or a handicapped toilet? No   TIMED UP AND GO:  Was the test performed? No .      Cognitive Function:        09/10/2022    9:27 AM 09/07/2021    3:33 PM  6CIT Screen  What Year? 0 points 0 points  What month? 0 points 0 points  What time? 0 points 0 points  Count back from 20 0 points 0 points  Months in reverse 0 points 0 points  Repeat phrase 8 points 0 points  Total Score 8 points 0 points    Immunizations Immunization History  Administered Date(s) Administered   Influenza,inj,Quad PF,6+ Mos 06/22/2013, 04/14/2014, 02/01/2015, 02/15/2016, 03/21/2017, 04/02/2018, 02/11/2019, 03/01/2020, 05/24/2021, 08/02/2022   Influenza-Unspecified 06/22/2013, 04/14/2014, 02/01/2015, 02/15/2016, 03/21/2017, 04/02/2018, 02/11/2019   Moderna Sars-Covid-2 Vaccination 01/31/2020, 02/28/2020   Pneumococcal Polysaccharide-23 03/01/2020    TDAP status: Due, Education has been provided regarding the importance of this vaccine. Advised may receive this vaccine at local pharmacy or Health Dept. Aware to provide a copy of the vaccination record if obtained from local pharmacy or Health Dept. Verbalized acceptance and understanding.  Flu Vaccine status: Up to date  Pneumococcal vaccine status: Up to date  Covid-19 vaccine status: Completed vaccines  Qualifies for Shingles Vaccine? Yes   Zostavax completed No   Shingrix Completed?: No.    Education has been provided regarding the importance of this vaccine. Patient has been advised to call insurance company to determine out of pocket expense if they have not yet received this vaccine. Advised may also receive vaccine at local pharmacy or Health Dept. Verbalized acceptance and understanding.  Screening Tests Health Maintenance  Topic Date Due   HIV Screening  Never done   Zoster Vaccines- Shingrix (1 of 2) Never done   COVID-19 Vaccine (3 - Moderna risk series) 03/27/2020   Diabetic kidney evaluation - Urine  ACR  08/29/2022   Medicare Annual Wellness (AWV)  09/08/2022   HEMOGLOBIN A1C  12/20/2022   INFLUENZA VACCINE  12/27/2022   Diabetic kidney evaluation - eGFR measurement  06/22/2023   FOOT EXAM  08/02/2023   OPHTHALMOLOGY EXAM  08/07/2023   COLONOSCOPY (Pts 45-42yrs Insurance coverage will need to be confirmed)  11/23/2031   Hepatitis C Screening  Completed   HPV VACCINES  Aged Out   DTaP/Tdap/Td  Discontinued    Health Maintenance  Health Maintenance Due  Topic Date Due   HIV Screening  Never done   Zoster Vaccines- Shingrix (1 of 2) Never done   COVID-19 Vaccine (3 - Moderna risk series) 03/27/2020   Diabetic kidney evaluation - Urine ACR  08/29/2022   Medicare Annual Wellness (AWV)  09/08/2022    Colorectal cancer screening: Type of screening: Colonoscopy. Completed 11/22/2021. Repeat every 10 years  Lung Cancer Screening: (Low Dose CT Chest recommended if Age 74-80 years, 30 pack-year currently smoking OR have quit w/in 15years.) does not qualify.   Lung Cancer Screening Referral: no  Additional Screening:  Hepatitis C Screening: does qualify; Completed 07/31/2006  Vision Screening: Recommended annual ophthalmology exams for early detection of glaucoma and other disorders of the eye. Is the patient up to date with their annual eye exam?  Yes  Who is the provider or what is the name of the office in which the patient attends  annual eye exams? Dr. Dione Booze  If pt is not established with a provider, would they like to be referred to a provider to establish care? No .   Dental Screening: Recommended annual dental exams for proper oral hygiene  Community Resource Referral / Chronic Care Management: CRR required this visit?  No   CCM required this visit?  No      Plan:     I have personally reviewed and noted the following in the patient's chart:   Medical and social history Use of alcohol, tobacco or illicit drugs  Current medications and supplements including opioid  prescriptions. Patient is not currently taking opioid prescriptions. Functional ability and status Nutritional status Physical activity Advanced directives List of other physicians Hospitalizations, surgeries, and ER visits in previous 12 months Vitals Screenings to include cognitive, depression, and falls Referrals and appointments  In addition, I have reviewed and discussed with patient certain preventive protocols, quality metrics, and best practice recommendations. A written personalized care plan for preventive services as well as general preventive health recommendations were provided to patient.     Barb Merino, LPN   1/61/0960   Nurse Notes: none  Due to this being a virtual visit, the after visit summary with patients personalized plan was offered to patient via mail or my-chart. Patient would like to access on my-chart

## 2022-09-12 DIAGNOSIS — E1065 Type 1 diabetes mellitus with hyperglycemia: Secondary | ICD-10-CM | POA: Diagnosis not present

## 2022-09-29 ENCOUNTER — Other Ambulatory Visit: Payer: Self-pay | Admitting: Neurology

## 2022-10-01 NOTE — Telephone Encounter (Signed)
Last seen on 02/07/22 per note " Continue baclofen but go back to 5 to 6 pills a day. " Follow up scheduled on 12/19/22 Last filled on 07/02/22 #540 tablets (90 day supply)

## 2022-10-03 ENCOUNTER — Telehealth: Payer: Self-pay | Admitting: Internal Medicine

## 2022-10-03 DIAGNOSIS — G35 Multiple sclerosis: Secondary | ICD-10-CM

## 2022-10-03 NOTE — Telephone Encounter (Signed)
Jahswani from [facility] called and said the patient is requesting a rollator walker. They would like it to be sent to [facility] in [place]. Their fax number is [phone].  Best callback for patient is [phone].

## 2022-10-03 NOTE — Telephone Encounter (Signed)
Faxed DME to [facility] @ [phone]./lmb

## 2022-10-03 NOTE — Telephone Encounter (Signed)
MS 

## 2022-10-03 NOTE — Telephone Encounter (Signed)
ok 

## 2022-10-12 DIAGNOSIS — E1065 Type 1 diabetes mellitus with hyperglycemia: Secondary | ICD-10-CM | POA: Diagnosis not present

## 2022-10-19 ENCOUNTER — Telehealth: Payer: Self-pay | Admitting: Internal Medicine

## 2022-10-19 DIAGNOSIS — G35 Multiple sclerosis: Secondary | ICD-10-CM

## 2022-10-19 NOTE — Telephone Encounter (Signed)
Patient called and said that the walker we ordered is not the walker that he requested. Please call patient at :  [phone]

## 2022-10-23 NOTE — Telephone Encounter (Signed)
Pt called requesting a call back from nurse.

## 2022-10-24 DIAGNOSIS — H18831 Recurrent erosion of cornea, right eye: Secondary | ICD-10-CM | POA: Diagnosis not present

## 2022-10-24 DIAGNOSIS — H04123 Dry eye syndrome of bilateral lacrimal glands: Secondary | ICD-10-CM | POA: Diagnosis not present

## 2022-10-24 DIAGNOSIS — S0500XD Injury of conjunctiva and corneal abrasion without foreign body, unspecified eye, subsequent encounter: Secondary | ICD-10-CM | POA: Diagnosis not present

## 2022-10-24 NOTE — Telephone Encounter (Signed)
Pt call back wanting a called back from the nurse pt stated  he have called 3 times and just want a nurse to return his call.

## 2022-10-24 NOTE — Telephone Encounter (Signed)
Pt request a "Drive [id number] Walker"   DME order has been done again and faxed to [place]

## 2022-10-24 NOTE — Addendum Note (Signed)
Addended by: Darryll Capers on: 10/24/2022 11:42 AM   Modules accepted: Orders

## 2022-10-25 ENCOUNTER — Other Ambulatory Visit: Payer: Medicare PPO

## 2022-10-26 ENCOUNTER — Other Ambulatory Visit (INDEPENDENT_AMBULATORY_CARE_PROVIDER_SITE_OTHER): Payer: Medicare PPO

## 2022-10-26 DIAGNOSIS — E063 Autoimmune thyroiditis: Secondary | ICD-10-CM

## 2022-10-26 DIAGNOSIS — E1065 Type 1 diabetes mellitus with hyperglycemia: Secondary | ICD-10-CM | POA: Diagnosis not present

## 2022-10-26 LAB — COMPREHENSIVE METABOLIC PANEL
ALT: 18 U/L (ref 0–53)
AST: 23 U/L (ref 0–37)
Albumin: 4.1 g/dL (ref 3.5–5.2)
Alkaline Phosphatase: 79 U/L (ref 39–117)
BUN: 12 mg/dL (ref 6–23)
CO2: 30 mEq/L (ref 19–32)
Calcium: 9 mg/dL (ref 8.4–10.5)
Chloride: 103 mEq/L (ref 96–112)
Creatinine, Ser: 1.21 mg/dL (ref 0.40–1.50)
GFR: 69.96 mL/min (ref >60.00)
Glucose, Bld: 93 mg/dL (ref 70–99)
Potassium: 4.1 mEq/L (ref 3.5–5.1)
Sodium: 140 mEq/L (ref 135–145)
Total Bilirubin: 0.6 mg/dL (ref 0.2–1.2)
Total Protein: 6.8 g/dL (ref 6.0–8.3)

## 2022-10-26 LAB — HEMOGLOBIN A1C: Hgb A1c MFr Bld: 7.4 % — ABNORMAL HIGH (ref 4.6–6.5)

## 2022-10-26 LAB — TSH: TSH: 0.78 u[IU]/mL (ref 0.35–5.50)

## 2022-11-01 ENCOUNTER — Telehealth (INDEPENDENT_AMBULATORY_CARE_PROVIDER_SITE_OTHER): Payer: Medicare PPO | Admitting: Endocrinology

## 2022-11-01 ENCOUNTER — Encounter: Payer: Self-pay | Admitting: Endocrinology

## 2022-11-01 DIAGNOSIS — E063 Autoimmune thyroiditis: Secondary | ICD-10-CM | POA: Diagnosis not present

## 2022-11-01 DIAGNOSIS — E1065 Type 1 diabetes mellitus with hyperglycemia: Secondary | ICD-10-CM

## 2022-11-01 DIAGNOSIS — E7800 Pure hypercholesterolemia, unspecified: Secondary | ICD-10-CM | POA: Diagnosis not present

## 2022-11-01 NOTE — Progress Notes (Signed)
Subjective:              Patient ID: Johnathan Ross, male   DOB: May 07, 1971, 52 y.o.   MRN: [id number]  I connected with the above-named patient by video enabled telemedicine application and verified that I am speaking with the correct person. The patient was explained the limitations of evaluation and management by telemedicine and the availability of in person appointments.  Patient also understood that there may be a patient responsible charge related to this service  Location of the patient: Patient's home  Location of the provider: Physician office Only the patient and myself were participating in the encounter The patient understood the above statements and agreed to proceed.   Diabetes  Diagnosis: Type 1 diabetes mellitus, date of diagnosis:  1995.   Insulin Pump followup:   CURRENT insulin pump:  Medtronic 780 G  HISTORY: An insulin pump has been in use since 03/13/11.   PUMP settings    Basal rate: 12 AM- 4 AM = 0.25, 4 AM- 10 PM = 1.5,10 PM = 0.25  Carbohydrate to insulin ratio: 8 AM = 1: 8, 12 noon = 1: 9.3, 10 PM = 1: 20  Sensitivity settings: 12 AM = 1: 45, 7 AM until 8 a.m. = 1: 35 Blood sugar target 130 ACTIVE insulin time 2 hours  Recent history:  His A1c is stable at 7.4 Lowest A1c in the past has been 7%  Management reviewed from guardian sensor and pump download, current history and problems identified:   He has been on the 780 pump since 9/23 His pump has been connected with the [facility]   He has fairly stable blood sugar control and consistent since the new pump was started last year Although he has some variable response to his boluses at meals generally and no excessive rise in blood sugars after meals and control is excellent Appears however that his bolus may be a little slow in onset at dinnertime, likely from bolusing right before eating and not waiting He may have adjusted his carbohydrate ratio on his own but appears to be  effective Occasionally may have mild hypoglycemia with potentially an excessive bolus Also periodically has a high reading after meals then the target Last night after eating fast food his blood sugar came down relatively rapidly but then subsequently rebounded and stayed higher because of higher fat intake As usual he is not able to exercise   GMI is 6.7 for the last 2 weeks  Interpretation of his Guardian sensor for the last few weeks shows the following  Overnight blood sugars are averaging about 150 and then decrease generally to near 120; has a slight dawn phenomenon before 7 AM which is not consistent.  No hypoglycemia overnight  Premeal blood sugars are generally averaging higher at dinnertime based on the bolus timing but variable POSTPRANDIAL readings are on an average fairly well-controlled with usual variability Postprandial readings after lunch may be low normal by 3 hours but more even after dinner Last night had a low normal reading after dinner with subsequent rebound Occasional hypoglycemia during the day but may be related to previous bolus No increased hypoglycemia compared to last visit Time in range is similar to his last visit, previously 81%   Data:  CGM use % of time   2-week average/GV 143/29  Time in range       81%  % Time Above 180 16+1  % Time above 250   % Time Below 70  2     PRE-bolus Fasting Lunch Dinner Bedtime Overall  Glucose range:       Averages:  153 176     POST-MEAL PC Breakfast PC Lunch PC Dinner  Glucose range:     Averages:  145 170   Previous data:   CGM use % of time   2-week average/GV 140/30  Time in range      81%  % Time Above 180 16  % Time above 250 2  % Time Below 70 1     PRE-MEAL Fasting Lunch Dinner Bedtime Overall  Glucose range:       Averages:  143 167     POST-MEAL PC Breakfast PC Lunch PC Dinner  Glucose range:     Averages:  131 155     Wt Readings from Last 3 Encounters:  09/10/22 198 lb (89.8  kg)  08/02/22 197 lb (89.4 kg)  03/04/22 196 lb 3.4 oz (89 kg)     Lab Results  Component Value Date   HGBA1C 7.4 (H) 10/26/2022   HGBA1C 7.4 (H) 06/21/2022   HGBA1C 7.4 (H) 02/07/2022   Lab Results  Component Value Date   MICROALBUR <0.7 08/28/2021   LDLCALC 82 06/21/2022   CREATININE 1.21 10/26/2022     OTHER active problems discussed today: See review of systems    Allergies as of 11/01/2022       Reactions   Lisinopril Cough   Ivp Dye [iodinated Contrast Media] Hives, Itching        Medication List        Accurate as of November 01, 2022  9:49 AM. If you have any questions, ask your nurse or doctor.          baclofen 10 MG tablet Commonly known as: LIORESAL TAKE 1 TO 2 TABLETS THREE TIMES A DAY (UP TO 6 PILLS/DAY)   Contour Next Test test strip Generic drug: glucose blood USE 2 TIMES PER DAY. E10.65   dalfampridine 10 MG Tb12 Take 1 tablet (10 mg total) by mouth 2 (two) times daily.   Dexcom G5 Receiver Kit Devi by Does not apply route.   Dexcom G6 Sensor Misc Use as directed.   Dexcom G6 Transmitter Misc Use to monitor blood sugar   gabapentin 100 MG capsule Commonly known as: NEURONTIN Take 1 capsule (100 mg total) by mouth 3 (three) times daily.   Gvoke HypoPen 2-Pack 1 MG/0.2ML Soaj Generic drug: Glucagon Inject 1 Act into the skin daily as needed.   levocetirizine 5 MG tablet Commonly known as: XYZAL Take 1 tablet (5 mg total) by mouth every evening.   losartan 50 MG tablet Commonly known as: COZAAR TAKE 1 TABLET BY MOUTH EVERY DAY   NovoLOG 100 UNIT/ML injection Generic drug: insulin aspart USE UP TO 80 UNITS IN INSULIN PUMP DAILY-DX CODE E10.8   OVER THE COUNTER MEDICATION Sea moss supplement-vitamin and minerals daily   silodosin 8 MG Caps capsule Commonly known as: RAPAFLO Take 1 capsule (8 mg total) by mouth daily with breakfast.   simvastatin 20 MG tablet Commonly known as: ZOCOR TAKE 1 TABLET BY MOUTH EVERYDAY AT  BEDTIME   solifenacin 5 MG tablet Commonly known as: VESICARE TAKE 1 TABLET (5 MG TOTAL) BY MOUTH DAILY.   Synthroid 125 MCG tablet Generic drug: levothyroxine TAKE 1 TABLET BY MOUTH 6 DAYS PER WEEK        Allergies:  Allergies  Allergen Reactions   Lisinopril Cough   Ivp Dye [  Iodinated Contrast Media] Hives and Itching    Past Medical History:  Diagnosis Date   Hyperlipidemia    Hypertension    Hypogonadism in male 11/01/2016   Hypothyroidism    Multiple sclerosis (HCC)    Neuromuscular disorder (HCC)    Proliferative diabetic retinopathy(362.02)    Type 1 diabetes mellitus (HCC) dx'd 1994   Vitamin D insufficiency 11/29/2016    Past Surgical History:  Procedure Laterality Date   EYE SURGERY Bilateral    "laser for diabetic retinopathy"   FRACTURE SURGERY     IR VENO/EXT/UNI LEFT  03/24/2020   OPEN REDUCTION INTERNAL FIXATION (ORIF) TIBIA/FIBULA FRACTURE Right 10/30/2016   ORIF ANKLE FRACTURE Left 2015   ORIF TIBIA FRACTURE Right 10/30/2016   Procedure: OPEN REDUCTION INTERNAL FIXATION (ORIF) TIBIA FIBULA FRACTURE;  Surgeon: Myrene Galas, MD;  Location: [facility] OR;  Service: Orthopedics;  Laterality: Right;   RETINAL LASER PROCEDURE Bilateral    "for diabetic retinopathy"    Family History  Problem Relation Age of Onset   Diabetes Sister    Colon polyps Maternal Uncle    Colon cancer Maternal Grandmother    Stomach cancer Cousin    Esophageal cancer Neg Hx     Social History:  reports that he has never smoked. He has never used smokeless tobacco. He reports that he does not drink alcohol and does not use drugs.    Review of Systems      HYPERCHOLESTEROLEMIA: He has been controlled with simvastatin 20 mg daily  LDL at goal  Lab Results  Component Value Date   CHOL 142 06/21/2022   HDL 50.90 06/21/2022   LDLCALC 82 06/21/2022   LDLDIRECT 65.0 02/09/2021   TRIG 44.0 06/21/2022   CHOLHDL 3 06/21/2022   Lab Results  Component Value Date   ALT 18  10/26/2022    HYPERTENSION: He had been on lisinopril with usually good control  of blood pressure  He is on losartan 50 mg daily Home blood pressure about 130 systolic   BP Readings from Last 3 Encounters:  08/02/22 136/82  03/12/22 118/80  03/04/22 137/89    He has MULTIPLE sclerosis, is followed regularly by neurologist  HYPOTHYROIDISM: He has had longstanding primary 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 autoimmune  His TSH previously had been frequently variable and not clear why it fluctuates, more recently been requiring lower doses He also does not feel any different whether his thyroid level is high or low He generally has a tendency of feeling sleepy during the day  His dose has been 125 mcg, 6 days a week  He is getting brand-name Synthroid consistently and is using 90-day prescriptions  He takes his Synthroid daily before breakfast TSH has been quite stable lately   Lab Results  Component Value Date   TSH 0.78 10/26/2022   TSH 2.88 06/21/2022   TSH 2.45 11/06/2021   FREET4 0.95 05/19/2021   FREET4 1.06 07/12/2020   FREET4 0.75 03/14/2020        Objective:   Physical Exam  There were no vitals taken for this visit.           Assessment:      DIABETES type I on Medtronic 780 insulin pump:   See history of present illness for detailed discussion of current diabetes management, blood sugar patterns and problems identified  A1c is 7.4 as before Recent GMI 6.7 Likely his A1c is higher than expected for his actual blood sugars and explained this may be related to genetic factors  Blood sugars are stable and similar to the last visit He is able to use the smart guard feature 93% of the time which is helping him with control  As before he has some and consistently around mealtimes based on the type of meal he is eating or timing of the bolus  Overall but somewhat variable after meals Some of the variability related to eating fast food and not getting consistent  coverage for these kinds of meals Also, may be able to benefit from bolusing a little ahead of time instead of right before eating Currently able to use the smart card fairly consistently Recent insulin usage is about 61 units, only slightly more than before  HYPOTHYROIDISM:  TSH consistently in the normal range on his current regimen of 6 days a week of levothyroxine 125 mcg  LIPIDS: Well-controlled  HYPERTENSION: Appears well on losartan   Plan:      Reminded him to start bolusing 10 to 15 minutes before meals consistently including in the evening No change in carbohydrate ratio May consider using Fiasp instead of NovoLog to see if this works faster for his meals although unclear whether this is as good in the pump  Discussed adjustment of boluses based on diet and also occasionally if he is eating a high-fat meal may split the bolus into 2 separate boluses before eating and 1 hour later  No change in doses of levothyroxine or simvastatin    There are no Patient Instructions on file for this visit.   Total visit time including counseling = 30 minutes  Aaron Boeh  Note: This office note was prepared with Insurance underwriter. Any transcriptional errors that result from this process are unintentional.

## 2022-11-02 ENCOUNTER — Telehealth: Payer: Self-pay | Admitting: Internal Medicine

## 2022-11-02 NOTE — Telephone Encounter (Signed)
Patient dropped off document Verification of Chronic Condition, to be filled out by provider. Patient requested to send it via Call Patient to pick up within 7-days. Document is located in providers tray at front office.Please advise at Mobile [phone] (mobile)

## 2022-11-07 ENCOUNTER — Other Ambulatory Visit: Payer: Self-pay | Admitting: Internal Medicine

## 2022-11-07 DIAGNOSIS — E782 Mixed hyperlipidemia: Secondary | ICD-10-CM

## 2022-11-08 NOTE — Telephone Encounter (Signed)
Patient called to check on the status of his paperwork. He would like a call back when it's ready. Best callback is [phone].

## 2022-11-09 NOTE — Telephone Encounter (Signed)
Pt has been informed that form must have been misplaced. He stated that he will have another mailed to him.   I instructed pt to ask for her personally so I can come out to get it from him.

## 2022-11-12 DIAGNOSIS — E1065 Type 1 diabetes mellitus with hyperglycemia: Secondary | ICD-10-CM | POA: Diagnosis not present

## 2022-11-16 DIAGNOSIS — E1042 Type 1 diabetes mellitus with diabetic polyneuropathy: Secondary | ICD-10-CM | POA: Diagnosis not present

## 2022-11-16 DIAGNOSIS — E108 Type 1 diabetes mellitus with unspecified complications: Secondary | ICD-10-CM | POA: Diagnosis not present

## 2022-11-16 DIAGNOSIS — E1065 Type 1 diabetes mellitus with hyperglycemia: Secondary | ICD-10-CM | POA: Diagnosis not present

## 2022-11-25 ENCOUNTER — Other Ambulatory Visit: Payer: Self-pay | Admitting: Internal Medicine

## 2022-11-25 DIAGNOSIS — N3281 Overactive bladder: Secondary | ICD-10-CM

## 2022-12-12 DIAGNOSIS — E1065 Type 1 diabetes mellitus with hyperglycemia: Secondary | ICD-10-CM | POA: Diagnosis not present

## 2022-12-19 ENCOUNTER — Encounter: Payer: Self-pay | Admitting: Neurology

## 2022-12-19 ENCOUNTER — Telehealth: Payer: Self-pay | Admitting: Neurology

## 2022-12-19 ENCOUNTER — Ambulatory Visit: Payer: Medicare PPO | Admitting: Neurology

## 2022-12-19 ENCOUNTER — Other Ambulatory Visit: Payer: Self-pay | Admitting: Neurology

## 2022-12-19 VITALS — BP 122/71 | HR 77 | Ht 73.0 in | Wt 197.5 lb

## 2022-12-19 DIAGNOSIS — G5620 Lesion of ulnar nerve, unspecified upper limb: Secondary | ICD-10-CM

## 2022-12-19 DIAGNOSIS — R39198 Other difficulties with micturition: Secondary | ICD-10-CM

## 2022-12-19 DIAGNOSIS — G35 Multiple sclerosis: Secondary | ICD-10-CM | POA: Diagnosis not present

## 2022-12-19 DIAGNOSIS — G830 Diplegia of upper limbs: Secondary | ICD-10-CM

## 2022-12-19 DIAGNOSIS — E1042 Type 1 diabetes mellitus with diabetic polyneuropathy: Secondary | ICD-10-CM

## 2022-12-19 DIAGNOSIS — R269 Unspecified abnormalities of gait and mobility: Secondary | ICD-10-CM

## 2022-12-19 DIAGNOSIS — G801 Spastic diplegic cerebral palsy: Secondary | ICD-10-CM

## 2022-12-19 NOTE — Progress Notes (Addendum)
[facility]  PATIENT: Johnathan Ross DOB: 1970-06-21  REFERRING DOCTOR OR PCP: Ria Clock, FNP SOURCE: Patient, notes from Dr. Daphane Shepherd (neurology), multiple MRI reports and images reviewed, lab reports.  _________________________________   HISTORICAL  CHIEF COMPLAINT:  Chief Complaint  Patient presents with   Follow-up    Pt in room 11. Mother in room. Here for MS follow up. Pt report doing okay, no concerns.     HISTORY OF PRESENT ILLNESS:  Johnathan Ross is a 52 year old man who was diagnosed with relapsing remitting MS around 2007.     Update 12/19/2022 He had his 2 years of Lemtrada therapy in 2016 and 2017.  He tolerated the infusions well but has had continued worsening of gait and spasticity.  He denies any exacerbation.  However, he does note mild worsening over the past few years with his gait and leg strength.   MRI of the brain 03/14/2022 did not show any new lesions.  His main problem is ambulation.   Ampyra seemed o help at first.   Not sure how much helping now.  He uses furniture/walls around the house and  uses a walker or a wheelchair for longer distance.  He can go > 100 feet with a walker without a rest and 400 feet or more with breaks.    He has a lot of spasticity and is taking baclofen up to 9 pills a day (20-30 mg bid to tid so).       He feels a little sleepy on high dose.   In the past he took tizanidine.  Spasticity increases when he uses his walker and lifts up with his arms.  Spasticity is also present at night.   He had a single series of Botox injections in November 2020 but felt weaker without any benefit of his spasticity.      The right leg is mildly weaker than the left leg. . Both legs spasm up, a little worse on the right than the left of note, the spasticity helps him support his weight when he walks.  Arms are strong.  He uses the walker to walk mostly by lifting with his arms.      He notes mild numbness in his feet.  He has  some some numbness in hands, worse at night.   There is no dysesthetic pain in the feet.  He also has Type 1 DM in 1995 and has been on insulin.  On the insulin pump his HgBA1c is usually 7's.  Before it was mostly 11-13.   He denies difficulties with his vision.  He has urinary urgency but also has some hesitancy and does not completely empty (usually has to go back one or two more times).  Flomax had not helped much so he stopped.  Vesicare has not helped much.  We also tried Rapaflo and not sure if helpingany We discussed seeing urology.    He reports some fatigue.  He snores and walkes up some but has never had gasping, snorting or pauses noted.   He denies much difficulties with cognition.  He denies mood disturbance.     MS history:  In 2007, he presented with spasms in his left leg.   He saw Dr. Daphane Shepherd in [place] and was started Rebif and had some progression and also had difficulty tolerating it.    He went on [place] in 2016 and had his second year in 2017.        Imaging studies:  MRIs of the brain and cervical spine from 07/27/2019:  The MRI of the spine shows several T2 hyperintense foci, located posterior laterally bilaterally, right larger than left adjacent to C2-C3, laterally to the left at C3-C4, centrally at C4-C5 also associated with spinal stenosis.  The MRI of the brain shows multiple T2/FLAIR hyperintense foci in the periventricular and juxtacortical white matter and a couple punctate foci in the cerebellum.  None of the foci appear to be acute.  Other medical history: He has type 1 diabetes mellitus and is on insulin (pump).  He also has hypothyroidism and is on Synthroid.  He has well-controlled hypertension.  REVIEW OF SYSTEMS: Constitutional: No fevers, chills, sweats, or change in appetite.  He notes some fatigue. Eyes: No visual changes, double vision, eye pain.  He has had laser eye surgery Ear, nose and throat: No hearing loss, ear pain, nasal congestion, sore  throat Cardiovascular: No chest pain, palpitations Respiratory:  No shortness of breath at rest or with exertion.   No wheezes GastrointestinaI: No nausea, vomiting, diarrhea, abdominal pain, fecal incontinence Genitourinary:  No dysuria, urinary retention or frequency.  No nocturia. Musculoskeletal:  No neck pain, back pain Integumentary: No rash, pruritus, skin lesions Neurological: as above Psychiatric: No depression at this time.  No anxiety Endocrine: No palpitations, diaphoresis, change in appetite, change in weigh or increased thirst Hematologic/Lymphatic:  No anemia, purpura, petechiae. Allergic/Immunologic: No itchy/runny eyes, nasal congestion, recent allergic reactions, rashes  ALLERGIES: Allergies  Allergen Reactions   Lisinopril Cough   Ivp Dye [Iodinated Contrast Media] Hives and Itching    HOME MEDICATIONS:  Current Outpatient Medications:    baclofen (LIORESAL) 10 MG tablet, TAKE 1 TO 2 TABLETS THREE TIMES A DAY (UP TO 6 PILLS/DAY), Disp: 540 tablet, Rfl: 0   Continuous Blood Gluc Receiver (DEXCOM G5 RECEIVER KIT) DEVI, by Does not apply route., Disp: , Rfl:    Continuous Blood Gluc Sensor (DEXCOM G6 SENSOR) MISC, Use as directed., Disp: 12 each, Rfl: 1   Continuous Blood Gluc Transmit (DEXCOM G6 TRANSMITTER) MISC, Use to monitor blood sugar, Disp: 1 each, Rfl: 0   dalfampridine 10 MG TB12, Take 1 tablet (10 mg total) by mouth 2 (two) times daily., Disp: 180 tablet, Rfl: 1   gabapentin (NEURONTIN) 100 MG capsule, Take 1 capsule (100 mg total) by mouth 3 (three) times daily., Disp: 90 capsule, Rfl: 5   Glucagon (GVOKE HYPOPEN 2-PACK) 1 MG/0.2ML SOAJ, Inject 1 Act into the skin daily as needed., Disp: 2 mL, Rfl: 5   glucose blood (CONTOUR NEXT TEST) test strip, USE 2 TIMES PER DAY. E10.65, Disp: 200 strip, Rfl: 3   insulin aspart (NOVOLOG) 100 UNIT/ML injection, USE UP TO 80 UNITS IN INSULIN PUMP DAILY-DX CODE E10.8, Disp: 70 mL, Rfl: 2   levocetirizine (XYZAL) 5 MG  tablet, Take 1 tablet (5 mg total) by mouth every evening., Disp: 90 tablet, Rfl: 1   losartan (COZAAR) 50 MG tablet, TAKE 1 TABLET BY MOUTH EVERY DAY, Disp: 90 tablet, Rfl: 3   OVER THE COUNTER MEDICATION, Sea moss supplement-vitamin and minerals daily, Disp: , Rfl:    silodosin (RAPAFLO) 8 MG CAPS capsule, Take 1 capsule (8 mg total) by mouth daily with breakfast., Disp: 30 capsule, Rfl: 11   simvastatin (ZOCOR) 20 MG tablet, TAKE 1 TABLET BY MOUTH EVERYDAY AT BEDTIME, Disp: 90 tablet, Rfl: 0   solifenacin (VESICARE) 5 MG tablet, TAKE 1 TABLET (5 MG TOTAL) BY MOUTH DAILY., Disp: 90 tablet, Rfl: 0  SYNTHROID 125 MCG tablet, TAKE 1 TABLET BY MOUTH 6 DAYS PER WEEK, Disp: 72 tablet, Rfl: 1  PAST MEDICAL HISTORY: Past Medical History:  Diagnosis Date   Hyperlipidemia    Hypertension    Hypogonadism in male 11/01/2016   Hypothyroidism    Multiple sclerosis (HCC)    Neuromuscular disorder (HCC)    Proliferative diabetic retinopathy(362.02)    Type 1 diabetes mellitus (HCC) dx'd 1994   Vitamin D insufficiency 11/29/2016    PAST SURGICAL HISTORY: Past Surgical History:  Procedure Laterality Date   EYE SURGERY Bilateral    "laser for diabetic retinopathy"   FRACTURE SURGERY     IR VENO/EXT/UNI LEFT  03/24/2020   OPEN REDUCTION INTERNAL FIXATION (ORIF) TIBIA/FIBULA FRACTURE Right 10/30/2016   ORIF ANKLE FRACTURE Left 2015   ORIF TIBIA FRACTURE Right 10/30/2016   Procedure: OPEN REDUCTION INTERNAL FIXATION (ORIF) TIBIA FIBULA FRACTURE;  Surgeon: Myrene Galas, MD;  Location: [facility] OR;  Service: Orthopedics;  Laterality: Right;   RETINAL LASER PROCEDURE Bilateral    "for diabetic retinopathy"    FAMILY HISTORY: Family History  Problem Relation Age of Onset   Diabetes Sister    Colon polyps Maternal Uncle    Colon cancer Maternal Grandmother    Stomach cancer Cousin    Esophageal cancer Neg Hx     SOCIAL HISTORY:  Social History   Socioeconomic History   Marital status: Single     Spouse name: 12   Number of children: 1   Years of education: Not on file   Highest education level: Not on file  Occupational History   Occupation: unemployed  Tobacco Use   Smoking status: Never   Smokeless tobacco: Never  Vaping Use   Vaping status: Never Used  Substance and Sexual Activity   Alcohol use: No   Drug use: No   Sexual activity: Not on file  Other Topics Concern   Not on file  Social History Narrative   Right handed   No caffeine    Lives with girlfriend   Social Determinants of Health   Financial Resource Strain: Low Risk  (09/10/2022)   Overall Financial Resource Strain (CARDIA)    Difficulty of Paying Living Expenses: Not hard at all  Food Insecurity: No Food Insecurity (09/10/2022)   Hunger Vital Sign    Worried About Running Out of Food in the Last Year: Never true    Ran Out of Food in the Last Year: Never true  Transportation Needs: No Transportation Needs (09/10/2022)   PRAPARE - Administrator, Civil Service (Medical): No    Lack of Transportation (Non-Medical): No  Physical Activity: Sufficiently Active (09/10/2022)   Exercise Vital Sign    Days of Exercise per Week: 5 days    Minutes of Exercise per Session: 60 min  Stress: No Stress Concern Present (09/10/2022)   [employer] of Occupational Health - Occupational Stress Questionnaire    Feeling of Stress : Not at all  Social Connections: Unknown (10/06/2021)   [facility]   Social Network    Social Network: Not on file  Recent Concern: Social Connections - Moderately Isolated (09/07/2021)   Social Connection and Isolation Panel [NHANES]    Frequency of Communication with Friends and Family: More than three times a week    Frequency of Social Gatherings with Friends and Family: More than three times a week    Attends Religious Services: Never    Database administrator or  Organizations: No    Attends Banker Meetings: Never    Marital  Status: Living with partner  Intimate Partner Violence: Not At Risk (09/07/2021)   Humiliation, Afraid, Rape, and Kick questionnaire    Fear of Current or Ex-Partner: No    Emotionally Abused: No    Physically Abused: No    Sexually Abused: No     PHYSICAL EXAM  Vitals:   12/19/22 1310 12/19/22 1313  BP: (!) 145/83 122/71  Pulse: 78 77  Weight: 197 lb 8 oz (89.6 kg)   Height: 6\' 1"  (1.854 m)     Body mass index is 26.06 kg/m.  No results found.  General: The patient is well-developed and well-nourished and in no acute distress   Skin: Extremities are without rash or  edema.  Neurologic Exam  Mental status: The patient is alert and oriented x 3 at the time of the examination. The patient has apparent normal recent and remote memory, with an apparently normal attention span and concentration ability.   Speech is normal.  Cranial nerves: Extraocular movements are full   Color vision was symmetric.  Facial symmetry is present.  Facial strength was normal.  Trapezius strength was normal no obvious hearing deficits are noted.  Motor:  Muscle bulk is normal.   Muscle tone is increased in the legs.  Strength is 5/5 in the arms except 4+/5 ulnar hand muscles bilaterally.  , 3 to 4 - in the hip flexors, 4 in the leg extenders, 4- in the gastrocnemius muscles and 3  with ankle extensors and toe extensors.  The right was slightly weaker than the left.  Sensory: Sensory testing is intact to pinprick, soft touch and vibration sensation in the arms but he has reduced vibration sensation and reduced touch sensation in the feet vibration sensation was just mildly reduced at the ankle.  Other:  Tinels signs at both elbows.    Coordination: Cerebellar testing reveals good finger-nose-finger and unable to do  heel-to-shin bilaterally.  Gait and station: He can rise up from a chair using his arms to support..  Cannot walk without using strong bilateral support.  Reflexes: Deep tendon reflexes  are normal in the arms and knees and absent at the toes.  Babinski responses are extensor.    DIAGNOSTIC DATA (LABS, IMAGING, TESTING) - I reviewed patient records, labs, notes, testing and imaging myself where available.  Lab Results  Component Value Date   WBC 7.0 03/02/2022   HGB 14.9 03/02/2022   HCT 44.3 03/02/2022   MCV 88.2 03/02/2022   PLT 173 03/02/2022      Component Value Date/Time   NA 140 10/26/2022 0932   K 4.1 10/26/2022 0932   CL 103 10/26/2022 0932   CO2 30 10/26/2022 0932   GLUCOSE 93 10/26/2022 0932   GLUCOSE 147 (H) 05/24/2006 1022   BUN 12 10/26/2022 0932   CREATININE 1.21 10/26/2022 0932   CALCIUM 9.0 10/26/2022 0932   CALCIUM 8.7 10/31/2016 0415   PROT 6.8 10/26/2022 0932   ALBUMIN 4.1 10/26/2022 0932   AST 23 10/26/2022 0932   ALT 18 10/26/2022 0932   ALKPHOS 79 10/26/2022 0932   BILITOT 0.6 10/26/2022 0932   GFRNONAA >60 03/02/2022 1854   GFRAA 87 07/28/2019 0000   Lab Results  Component Value Date   CHOL 142 06/21/2022   HDL 50.90 06/21/2022   LDLCALC 82 06/21/2022   LDLDIRECT 65.0 02/09/2021   TRIG 44.0 06/21/2022   CHOLHDL 3 06/21/2022  Lab Results  Component Value Date   HGBA1C 7.4 (H) 10/26/2022   No results found for: "VITAMINB12" Lab Results  Component Value Date   TSH 0.78 10/26/2022       ASSESSMENT AND PLAN  Multiple sclerosis (HCC)  Gait disorder  Acquired diplegia (HCC)  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HCC)  Ulnar neuritis, unspecified laterality  Urinary dysfunction   1  .MS is stable since doing 2 years of Lemtrada in 2016 and 2017, with no exacerbation though mild progression especially with more spasticity in the legs.  His last MRI (02/2022)  showed no new lesions.    Around time of next visit, MRI of the brain and cervical spine to determine if there is breakthrough activity.  If this is occurring we need to start a disease modifying therapy again (consider Mavenclad) 2.     As  not much benefit using Rapaflo med's for urinary hesitancy, will refer to urology to make sure that there is not a structural issue.    Will try reducing the baclofen some as well (frm 60-80 mg to 30 mg/day) 3.     Stay active and walk as tolerated with walker 4.    Due to leg weakness/foot drop, he would benefit from a left AFO to prevent falls and improve gait. 5.   He will return to see [place] in 6 months or sooner for new or worsening neurologic symptoms.  Aviya Jarvie A. Epimenio Foot, MD, [facility] 12/19/2022, 2:12 PM Certified in Neurology, Clinical Neurophysiology, Sleep Medicine and Neuroimaging  [facility] [address] [place], [place] [address] [phone]

## 2022-12-19 NOTE — Telephone Encounter (Signed)
Pt was seen on 12/19/22 per note "Will try reducing the baclofen some as well (frm 60-80 mg to 30 mg/day) "

## 2022-12-19 NOTE — Telephone Encounter (Signed)
Referral for urology fax to [facility]. [phone], Fax: [phone]

## 2022-12-25 ENCOUNTER — Other Ambulatory Visit: Payer: Self-pay | Admitting: Endocrinology

## 2023-01-09 ENCOUNTER — Other Ambulatory Visit: Payer: Self-pay | Admitting: Endocrinology

## 2023-01-11 DIAGNOSIS — E1065 Type 1 diabetes mellitus with hyperglycemia: Secondary | ICD-10-CM | POA: Diagnosis not present

## 2023-01-14 DIAGNOSIS — E1065 Type 1 diabetes mellitus with hyperglycemia: Secondary | ICD-10-CM | POA: Diagnosis not present

## 2023-01-15 ENCOUNTER — Ambulatory Visit: Payer: Medicare PPO | Attending: Neurology | Admitting: Physical Therapy

## 2023-01-15 ENCOUNTER — Encounter: Payer: Self-pay | Admitting: Physical Therapy

## 2023-01-15 ENCOUNTER — Other Ambulatory Visit: Payer: Self-pay

## 2023-01-15 DIAGNOSIS — R296 Repeated falls: Secondary | ICD-10-CM | POA: Diagnosis not present

## 2023-01-15 DIAGNOSIS — R29818 Other symptoms and signs involving the nervous system: Secondary | ICD-10-CM | POA: Diagnosis not present

## 2023-01-15 DIAGNOSIS — M6281 Muscle weakness (generalized): Secondary | ICD-10-CM | POA: Insufficient documentation

## 2023-01-15 DIAGNOSIS — G35 Multiple sclerosis: Secondary | ICD-10-CM | POA: Insufficient documentation

## 2023-01-15 DIAGNOSIS — M21372 Foot drop, left foot: Secondary | ICD-10-CM | POA: Diagnosis not present

## 2023-01-15 DIAGNOSIS — G801 Spastic diplegic cerebral palsy: Secondary | ICD-10-CM | POA: Diagnosis not present

## 2023-01-15 DIAGNOSIS — M21371 Foot drop, right foot: Secondary | ICD-10-CM | POA: Diagnosis not present

## 2023-01-15 DIAGNOSIS — R2681 Unsteadiness on feet: Secondary | ICD-10-CM | POA: Insufficient documentation

## 2023-01-15 DIAGNOSIS — R2689 Other abnormalities of gait and mobility: Secondary | ICD-10-CM | POA: Diagnosis not present

## 2023-01-15 NOTE — Therapy (Signed)
OUTPATIENT PHYSICAL THERAPY NEURO EVALUATION   Patient Name: Johnathan Ross MRN: [id number] DOB:September 16, 1970, 52 y.o., male Today's Date: 01/15/2023   PCP: Etta Grandchild, MD REFERRING PROVIDER: Asa Lente, MD    01/15/23 1029  PT Visits / Re-Eval  Visit Number 1  Number of Visits 9 (8 + eval)  Date for PT Re-Evaluation 03/29/23  Authorization  Authorization Type [employer]  Progress Note Due on Visit 10  PT Time Calculation  PT Start Time 1019  PT Stop Time 1107  PT Time Calculation (min) 48 min  PT - End of Session  Equipment Utilized During Treatment Gait belt  Behavior During Therapy WFL for tasks assessed/performed    Past Medical History:  Diagnosis Date   Hyperlipidemia    Hypertension    Hypogonadism in male 11/01/2016   Hypothyroidism    Multiple sclerosis (HCC)    Neuromuscular disorder (HCC)    Proliferative diabetic retinopathy(362.02)    Type 1 diabetes mellitus (HCC) dx'd 1994   Vitamin D insufficiency 11/29/2016   Past Surgical History:  Procedure Laterality Date   EYE SURGERY Bilateral    "laser for diabetic retinopathy"   FRACTURE SURGERY     IR VENO/EXT/UNI LEFT  03/24/2020   OPEN REDUCTION INTERNAL FIXATION (ORIF) TIBIA/FIBULA FRACTURE Right 10/30/2016   ORIF ANKLE FRACTURE Left 2015   ORIF TIBIA FRACTURE Right 10/30/2016   Procedure: OPEN REDUCTION INTERNAL FIXATION (ORIF) TIBIA FIBULA FRACTURE;  Surgeon: Myrene Galas, MD;  Location: [facility] OR;  Service: Orthopedics;  Laterality: Right;   RETINAL LASER PROCEDURE Bilateral    "for diabetic retinopathy"   Patient Active Problem List   Diagnosis Date Noted   Mass of skin of right lower extremity 08/02/2022   Oropharyngeal dysphagia 08/02/2022   Nasal congestion 08/02/2022   Encounter for general adult medical examination with abnormal findings 08/02/2022   Screening for prostate cancer 08/01/2021   OAB (overactive bladder) 09/21/2020   Essential hypertension 09/21/2020   Urinary  dysfunction 09/21/2020   Thoracic outlet syndrome 05/03/2020   Controlled type 1 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 (HCC) 03/24/2020   Right epiretinal membrane 03/24/2020   Retinal exudates and deposits 03/24/2020   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   Nuclear sclerotic cataract of both eyes 03/24/2020   Gait disorder 03/23/2020   Benign prostatic hyperplasia without lower urinary tract symptoms 03/01/2020   Diabetic gastroparesis associated with type 1 diabetes mellitus (HCC) 03/01/2020   Uncontrolled type 1 diabetes mellitus with hyperglycemia (HCC) 03/01/2020   Gait disturbance 08/19/2019   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/19/2019   Vitamin D insufficiency 11/29/2016   Hypogonadism in male 11/01/2016   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HCC)    Hypothyroidism    Hypertension    Abnormal liver function tests 03/06/2013   HYPERCHOLESTEROLEMIA 11/12/2007   MULTIPLE SCLEROSIS 08/29/2007   PROLIFERATIVE DIABETIC RETINOPATHY 08/29/2007    ONSET DATE: 12/19/2022 (referral date)  REFERRING DIAG:  G35 (ICD-10-CM) - Multiple sclerosis (HCC) 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)  THERAPY DIAG:  Other abnormalities of gait and mobility  Muscle weakness (generalized)  Unsteadiness on feet  Foot drop, right  Foot drop, left  Repeated falls  Other symptoms and signs involving the nervous system  Rationale for Evaluation and Treatment Rehabilitation  SUBJECTIVE:  SUBJECTIVE STATEMENT: Pt discharged from last episode of care here in December 2021.  Returns for follow-up regarding bilateral knee hyperextension.  He wants to work on stamina, how far he can walk before needing to stop for a break. Pt accompanied by: self  PERTINENT  HISTORY: vitamin D insufficiency, Type 1 IDDM w/ polyneuropathy, diabetic retinopathy, hypothyroidism, HTN, hypercholestrolemia, h/o Rt tibia fracture, h/o Lt ankle fracture with ORIF, MS with acquired diplegia (MS diagnosis 2014/2015)  PAIN:  Are you having pain? No - shoulder pain has resolved.  He never saw an ortho MD for shoulder.  PRECAUTIONS: Fall  WEIGHT BEARING RESTRICTIONS: No  FALLS: Has patient fallen in last 6 months? Yes. Number of falls 2 at the most, he was in the house trying to pick something up off the floor  LIVING ENVIRONMENT: Lives with:  lives with their partner (girlfriend) Lives in: House/apartment Stairs: Yes; External: 6 steps; on right going up, on left going up, and can reach both Has following equipment at home: Dan Humphreys - 2 wheeled, Wheelchair (manual), and shower chair  PLOF: Independent with basic ADLs, Independent with household mobility with device, Independent with transfers, and Needs assistance with homemaking  PATIENT GOALS: Increased stamina and knee control  OBJECTIVE:   DIAGNOSTIC FINDINGS: MRI of the cervical spine 03/14/2022 shows T2 hyperintense foci posterolaterally to the right adjacent to C2-C3, laterally to the left adjacent to C3-C4 and anterior/central at C4-C5.  These were all present on the MRI from 07/01/2015, there were no new lesions.  Additional note is made of a disc osteophyte complex to the right at C3-C4 that could compress the C4 nerve root and a disc osteophyte complex to the left at the C4-C5 that could affect the left C5 nerve root.  There is spinal stenosis but no nerve root compression at C5-C6.  There is a left disc osteophyte complex at C6-C7 causing foraminal narrowing but no nerve root compression   MRI of the brain 03/14/2022 shows scattered T2/FLAIR hyperintense in the periventricular, juxtacortical and deep white matter of both cerebral hemispheres.  A focus is also noted in the lateral left pons.  Paired to the MRI dated  08/20/2017, there are no new lesions.   COGNITION: Overall cognitive status: Within functional limits for tasks assessed   SENSATION: Light touch: WFL  COORDINATION: Decreased due to spasticity and weakness; pt has extensor tone/spasticity  POSTURE: No Significant postural limitations  LOWER EXTREMITY ROM:    Decreased AROM bil. LE's due to spasticity and weakness  LOWER EXTREMITY MMT:    MMT Right Eval Left Eval  Hip flexion 2- 2-  Hip extension    Hip abduction    Hip adduction    Hip internal rotation    Hip external rotation    Knee flexion    Knee extension 4- Assessment limited by spasticity kicking in w/ active extension  Ankle dorsiflexion 1+ 2  Ankle plantarflexion    Ankle inversion    Ankle eversion    (Blank rows = not tested)  BED MOBILITY:  NT  - Patient reports he maintains independence with this and that getting legs into and out of bed is easier  TRANSFERS: Assistive device utilized: Environmental consultant - 2 wheeled  Sit to stand: Modified independence and UE support needed Stand to sit: Modified independence  RAMP: NT due to decreased ambulation status  CURB: NT due to decreased ambulation status  STAIRS:  NT due to decreased ambulation status  GAIT: Gait pattern:  pt uses vaulting  gait pattern in which he lifts himself up and swings bil.LE's (paraplegic gait pattern) for increased speed; when not in hurry attempts reciprocating gait pattern, decreased step length- Left, decreased hip/knee flexion- Right, decreased hip/knee flexion- Left, decreased ankle dorsiflexion- Left, and poor foot clearance- Left Distance walked: 50 ft into treatment room + various clinic distances Assistive device utilized: Walker - 2 wheeled; pt is wearing his AFO on his RLE Level of assistance: SBA Comments: see above; gait varies depending on fatigue, activity/# of people in environment  FUNCTIONAL TESTS:  5 times sit to stand: 19.53 secs from chair with bil. UE support to RW,  BLE go into extension pattern w/ patient's  Timed up and go (TUG): 1 minute 43 seconds w/ RW; spasticity impacting performance  PATIENT SURVEYS:  None completed as none relevant to chief complaint.  TODAY'S TREATMENT:                                                                                  DATE: 01/15/2023 N/A - eval only.  PATIENT EDUCATION: Education details: Discussed orthopedic follow-up if knees start to bother him/cause pain as he reports frequent popping (audible during session) and for the right shoulder if it begins to bother him again.  PT POC, assessments used today and comparison to prior, and goals to be set.  Removed due to triggering spasticity and discussed impact on TUG, but keeping one for comparison with bracing options.  Discussion of MS, spasticity, possible reasons why MD wants MRIs each year vs prior 3 years (did encourage pt to speak to Dr. Epimenio Foot about this for clarification as pt reports concern for progression of MS), AFO for left foot (pt is open to this).  Recommended continued use of RW for safety as pt has used cane in yard previously. Person educated: Patient Education method: Explanation Education comprehension: verbalized understanding  HOME EXERCISE PROGRAM: To be reviewed/modified.  [id number] (code from episode prior)             Access Code: [email or web address] URL: [email or web address] Date: 04/12/2022 Prepared by: Maebelle Munroe   Exercises - Seated Hamstring Curl with Anchored Resistance  - 1 x daily - 7 x weekly - 1 sets - 10 reps - AND SEATED HIP ADDUCTION  - 1 x daily - 7 x weekly - 1 sets - 10 reps - 3-5 hold - Long Sitting Hamstring Stretch  - 1 x daily - 7 x weekly - 1 sets - 1-2 reps - 30-60 secs hold  GOALS: Goals reviewed with patient? Yes  SHORT TERM GOALS: Target date: 02/15/2023  Pt will decrease 5xSTS to </=16.53 seconds in order to demonstrate decreased risk for falls and improved functional bilateral LE strength and  power. Baseline:  19.53 secs Goal status: INITIAL  2.  Pt will trial AFO options in clinic and compare to need for custom AFO option with order request placed as appropriate. Baseline: To be assessed. Goal status: INITIAL  3.  Pt will demonstrate TUG of </=83 seconds in order to decrease risk of falls and improve functional mobility using LRAD. Baseline: 103 secs with RW Goal status: INITIAL  4.  Pt will undergo review of prior  HEP for appropriateness to current functional level and to maintain functional progress and improve mobility.  Baseline: To be reviewed. Goal status: INITIAL  LONG TERM GOALS: Target date: 03/15/2023  Pt will decrease 5xSTS to </=13.53 seconds in order to demonstrate decreased risk for falls and improved functional bilateral LE strength and power. Baseline:  19.53 secs Goal status: INITIAL  2.   Pt will demonstrate floor retrieval of household object using single UE support with return to upright at modI level in order to improve management in home and decrease risk of falls with daily tasks. Baseline: Picking up objects has caused pt 2 recent falls Goal status: INITIAL  3.   Pt will demonstrate TUG of </=63 seconds in order to decrease risk of falls and improve functional mobility using LRAD. Baseline: 103 secs with RW Goal status: INITIAL  4.  Pt will ambulate >/=100 feet with RW and bilateral AFOs continuously at no more than modI level of assist to promote household and community access. Baseline: 56' using paralytic gait and RW Goal status: INITIAL  5.  Patient will be independent and compliant to updated and modified HEP for maintenance of functional progress. Baseline: To be reviewed/modified. Goal status: INITIAL  ASSESSMENT:  CLINICAL IMPRESSION: Patient is a 53 y.o. male who was seen today for physical therapy evaluation and treatment for chronic deficits related to MS.  Pt has a significant PMH of vitamin D insufficiency, Type 1 IDDM w/  polyneuropathy, diabetic retinopathy, hypothyroidism, HTN, hypercholestrolemia, h/o Rt tibia fracture, h/o Lt ankle fracture with ORIF, and MS with acquired diplegia (MS diagnosis 2014/2015).  Identified impairments include history of falls, severe bilateral extensor spasticity, bilateral lower extremity weakness, and ongoing paralytic gait pattern relying on RW.  Evaluation via the following assessment tools: 5xSTS and TUG indicate significant fall risk.  His TUG time this episode is significantly worse than at discharge from prior with patient almost doubling time needed at 103 seconds.  He does appear to have more hypertonicity this visit and is open to bracing options on the left LE.  PT to trial options in clinic to decide if custom is more appropriate.  He would benefit from skilled PT to address impairments as noted and progress towards long term goals.   OBJECTIVE IMPAIRMENTS: Abnormal gait, decreased activity tolerance, decreased balance, decreased coordination, decreased endurance, decreased knowledge of condition, decreased mobility, difficulty walking, decreased ROM, decreased strength, increased muscle spasms, impaired tone, and improper body mechanics.   ACTIVITY LIMITATIONS: carrying, bending, standing, squatting, stairs, transfers, and locomotion level  PARTICIPATION LIMITATIONS: meal prep, cleaning, laundry, driving, shopping, community activity, and yard work  PERSONAL FACTORS: Age, Past/current experiences, Time since onset of injury/illness/exacerbation, and 1-2 comorbidities: ongoing assessment of MS progression, polyneuropathy  are also affecting patient's functional outcome.   REHAB POTENTIAL: Fair due to chronicity of deficits and progressive nature of MS disease process  CLINICAL DECISION MAKING: Stable/uncomplicated  EVALUATION COMPLEXITY: Low  PLAN:  PT FREQUENCY: 1x/week  PT DURATION: 8 weeks  PLANNED INTERVENTIONS: Therapeutic exercises, Therapeutic activity,  Neuromuscular re-education, Balance training, Gait training, Patient/Family education, Self Care, Stair training, Orthotic/Fit training, DME instructions, Electrical stimulation, Taping, Manual therapy, and Re-evaluation  PLAN FOR NEXT SESSION: Review HEP - modify as needed.  Gait training with trial of in-clinic L AFO options to determine need for custom (pt may only want off-the-shelf as this is what his other AFO is, may warrant discussion of most benefit vs what patient is willing to wear).  Reciprocal mobility -  SciFit, stride stance tasks.  Reaching to the floor with unilateral support.  Closed chain strengthening.  Sadie Haber, PT, DPT 01/15/2023, 11:19 AM

## 2023-01-24 ENCOUNTER — Other Ambulatory Visit: Payer: Self-pay | Admitting: Internal Medicine

## 2023-01-24 DIAGNOSIS — R0981 Nasal congestion: Secondary | ICD-10-CM

## 2023-02-03 ENCOUNTER — Other Ambulatory Visit: Payer: Self-pay | Admitting: Internal Medicine

## 2023-02-03 DIAGNOSIS — E782 Mixed hyperlipidemia: Secondary | ICD-10-CM

## 2023-02-04 ENCOUNTER — Ambulatory Visit: Payer: Medicare PPO | Attending: Neurology | Admitting: Physical Therapy

## 2023-02-04 ENCOUNTER — Encounter: Payer: Self-pay | Admitting: Physical Therapy

## 2023-02-04 DIAGNOSIS — M21371 Foot drop, right foot: Secondary | ICD-10-CM | POA: Insufficient documentation

## 2023-02-04 DIAGNOSIS — R2681 Unsteadiness on feet: Secondary | ICD-10-CM | POA: Insufficient documentation

## 2023-02-04 DIAGNOSIS — M21372 Foot drop, left foot: Secondary | ICD-10-CM | POA: Diagnosis not present

## 2023-02-04 DIAGNOSIS — M6281 Muscle weakness (generalized): Secondary | ICD-10-CM | POA: Diagnosis not present

## 2023-02-04 DIAGNOSIS — R2689 Other abnormalities of gait and mobility: Secondary | ICD-10-CM | POA: Diagnosis not present

## 2023-02-04 DIAGNOSIS — R29818 Other symptoms and signs involving the nervous system: Secondary | ICD-10-CM | POA: Insufficient documentation

## 2023-02-04 DIAGNOSIS — R296 Repeated falls: Secondary | ICD-10-CM | POA: Insufficient documentation

## 2023-02-04 NOTE — Therapy (Signed)
OUTPATIENT PHYSICAL THERAPY NEURO TREATMENT NOTE   Patient Name: Johnathan Ross MRN: [id number] DOB:08/03/1970, 52 y.o., male Today's Date: 02/04/2023   PCP: Etta Grandchild, MD REFERRING PROVIDER: Asa Lente, MD    PT End of Session - 02/04/23 1919     Visit Number 2    Number of Visits 8   8 Visits approved   Date for PT Re-Evaluation 03/29/23    Authorization Type [employer]IC[employer]    Authorization Time Period 01-15-23 - 03-29-23    Progress Note Due on Visit 10    PT Start Time 0933    PT Stop Time 1020    PT Time Calculation (min) 47 min    Equipment Utilized During Treatment Other (comment)   Lt Thuasane AFO   Activity Tolerance Patient tolerated treatment well    Behavior During Therapy [facility] for tasks assessed/performed               Past Medical History:  Diagnosis Date   Hyperlipidemia    Hypertension    Hypogonadism in male 11/01/2016   Hypothyroidism    Multiple sclerosis (HCC)    Neuromuscular disorder (HCC)    Proliferative diabetic retinopathy(362.02)    Type 1 diabetes mellitus (HCC) dx'd 1994   Vitamin D insufficiency 11/29/2016   Past Surgical History:  Procedure Laterality Date   EYE SURGERY Bilateral    "laser for diabetic retinopathy"   FRACTURE SURGERY     IR VENO/EXT/UNI LEFT  03/24/2020   OPEN REDUCTION INTERNAL FIXATION (ORIF) TIBIA/FIBULA FRACTURE Right 10/30/2016   ORIF ANKLE FRACTURE Left 2015   ORIF TIBIA FRACTURE Right 10/30/2016   Procedure: OPEN REDUCTION INTERNAL FIXATION (ORIF) TIBIA FIBULA FRACTURE;  Surgeon: Myrene Galas, MD;  Location: [facility] OR;  Service: Orthopedics;  Laterality: Right;   RETINAL LASER PROCEDURE Bilateral    "for diabetic retinopathy"   Patient Active Problem List   Diagnosis Date Noted   Mass of skin of right lower extremity 08/02/2022   Oropharyngeal dysphagia 08/02/2022   Nasal congestion 08/02/2022   Encounter for general adult medical examination with abnormal findings 08/02/2022   Screening for  prostate cancer 08/01/2021   OAB (overactive bladder) 09/21/2020   Essential hypertension 09/21/2020   Urinary dysfunction 09/21/2020   Thoracic outlet syndrome 05/03/2020   Controlled type 1 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 (HCC) 03/24/2020   Right epiretinal membrane 03/24/2020   Retinal exudates and deposits 03/24/2020   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   Nuclear sclerotic cataract of both eyes 03/24/2020   Gait disorder 03/23/2020   Benign prostatic hyperplasia without lower urinary tract symptoms 03/01/2020   Diabetic gastroparesis associated with type 1 diabetes mellitus (HCC) 03/01/2020   Uncontrolled type 1 diabetes mellitus with hyperglycemia (HCC) 03/01/2020   Gait disturbance 08/19/2019   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/19/2019   Vitamin D insufficiency 11/29/2016   Hypogonadism in male 11/01/2016   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HCC)    Hypothyroidism    Hypertension    Abnormal liver function tests 03/06/2013   HYPERCHOLESTEROLEMIA 11/12/2007   MULTIPLE SCLEROSIS 08/29/2007   PROLIFERATIVE DIABETIC RETINOPATHY 08/29/2007    ONSET DATE: 12/19/2022 (referral date)  REFERRING DIAG:  G35 (ICD-10-CM) - Multiple sclerosis (HCC) 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)  THERAPY DIAG:  Other abnormalities of gait and mobility  Muscle weakness (generalized)  Rationale for Evaluation and Treatment Rehabilitation  SUBJECTIVE:  SUBJECTIVE STATEMENT: Pt reports he is doing OK today - says Lt knee is not hyperextending quite as much today.  Pt initially says he does not want to try an AFO on LLE due to his shoe being so hard to get on and off;  pt reconsiders and agrees to try AFO Pt accompanied by: girlfriend  PERTINENT  HISTORY: vitamin D insufficiency, Type 1 IDDM w/ polyneuropathy, diabetic retinopathy, hypothyroidism, HTN, hypercholestrolemia, h/o Rt tibia fracture, h/o Lt ankle fracture with ORIF, MS with acquired diplegia (MS diagnosis 2014/2015)  PAIN:  Are you having pain? No - shoulder pain has resolved.  He never saw an ortho MD for shoulder.  PRECAUTIONS: Fall  WEIGHT BEARING RESTRICTIONS: No  FALLS: Has patient fallen in last 6 months? Yes. Number of falls 2 at the most, he was in the house trying to pick something up off the floor  LIVING ENVIRONMENT: Lives with:  lives with their partner (girlfriend) Lives in: House/apartment Stairs: Yes; External: 6 steps; on right going up, on left going up, and can reach both Has following equipment at home: Dan Humphreys - 2 wheeled, Wheelchair (manual), and shower chair  PLOF: Independent with basic ADLs, Independent with household mobility with device, Independent with transfers, and Needs assistance with homemaking  PATIENT GOALS: Increased stamina and knee control  OBJECTIVE:   DIAGNOSTIC FINDINGS: MRI of the cervical spine 03/14/2022 shows T2 hyperintense foci posterolaterally to the right adjacent to C2-C3, laterally to the left adjacent to C3-C4 and anterior/central at C4-C5.  These were all present on the MRI from 07/01/2015, there were no new lesions.  Additional note is made of a disc osteophyte complex to the right at C3-C4 that could compress the C4 nerve root and a disc osteophyte complex to the left at the C4-C5 that could affect the left C5 nerve root.  There is spinal stenosis but no nerve root compression at C5-C6.  There is a left disc osteophyte complex at C6-C7 causing foraminal narrowing but no nerve root compression   MRI of the brain 03/14/2022 shows scattered T2/FLAIR hyperintense in the periventricular, juxtacortical and deep white matter of both cerebral hemispheres.  A focus is also noted in the lateral left pons.  Paired to the MRI dated  08/20/2017, there are no new lesions.   Today's Treatment:  02-04-23  GAIT: Gait pattern:  pt uses vaulting gait pattern in which he lifts himself up and swings bil.LE's (paraplegic gait pattern) for increased speed; when not in hurry attempts reciprocating gait pattern, decreased step length- Left, decreased hip/knee flexion- Right, decreased hip/knee flexion- Left, decreased ankle dorsiflexion- Left, and poor foot clearance- Left Distance walked: approx. 100' from lobby to mat table on far side of gym; 115' with RW with AFO on LLE  Assistive device utilized: Environmental consultant - 2 wheeled; pt is wearing his AFO on his RLE Level of assistance: SBA Comments: Lt Thuasane AFO trialed on LLE - 115'; AFO did not provide consistent Lt foot clearance in swing and did not prevent Lt knee hyperextension; did provide some increased Lt foot clearance in swing phase of gait  THEREX;  Pt performed bil. Hamstring stretch in seated position with feet on stool - 30 sec hold x 1 rep Pt performed bil. Knee flexion in seated position with red theraband for strengthening 10 reps each leg - 3 sec hold Hip abduction in seated position - unilaterally performed with use of red theraband for resistance 10 reps each Hip flexion RLE and LLE 10 reps -  no resistance used due to weakness with pt unable to lift full range against gravity   PATIENT EDUCATION: Education details: Reviewed HEP as previously established and issued; educated pt in type of AFO (custom plastic) to optimally reduce Lt knee hyperextension and to increase Lt foot clearance simultaneously Person educated: Patient Education method: Explanation Education comprehension: verbalized understanding  HOME EXERCISE PROGRAM: To be reviewed/modified.  [id number] (code from episode prior)             Access Code: [email or web address] URL: [email or web address] Date: 04/12/2022 Prepared by: Maebelle Munroe   Exercises - Seated Hamstring Curl with Anchored Resistance  -  1 x daily - 7 x weekly - 1 sets - 10 reps - AND SEATED HIP ADDUCTION  - 1 x daily - 7 x weekly - 1 sets - 10 reps - 3-5 hold - Long Sitting Hamstring Stretch  - 1 x daily - 7 x weekly - 1 sets - 1-2 reps - 30-60 secs hold  GOALS: Goals reviewed with patient? Yes  SHORT TERM GOALS: Target date: 02/15/2023  Pt will decrease 5xSTS to </=16.53 seconds in order to demonstrate decreased risk for falls and improved functional bilateral LE strength and power. Baseline:  19.53 secs Goal status: INITIAL  2.  Pt will trial AFO options in clinic and compare to need for custom AFO option with order request placed as appropriate. Baseline: To be assessed. Goal status: INITIAL  3.  Pt will demonstrate TUG of </=83 seconds in order to decrease risk of falls and improve functional mobility using LRAD. Baseline: 103 secs with RW Goal status: INITIAL  4.  Pt will undergo review of prior HEP for appropriateness to current functional level and to maintain functional progress and improve mobility.  Baseline: To be reviewed. Goal status: INITIAL  LONG TERM GOALS: Target date: 03/15/2023  Pt will decrease 5xSTS to </=13.53 seconds in order to demonstrate decreased risk for falls and improved functional bilateral LE strength and power. Baseline:  19.53 secs Goal status: INITIAL  2.   Pt will demonstrate floor retrieval of household object using single UE support with return to upright at modI level in order to improve management in home and decrease risk of falls with daily tasks. Baseline: Picking up objects has caused pt 2 recent falls Goal status: INITIAL  3.   Pt will demonstrate TUG of </=63 seconds in order to decrease risk of falls and improve functional mobility using LRAD. Baseline: 103 secs with RW Goal status: INITIAL  4.  Pt will ambulate >/=100 feet with RW and bilateral AFOs continuously at no more than modI level of assist to promote household and community access. Baseline: 50' using  paralytic gait and RW Goal status: INITIAL  5.  Patient will be independent and compliant to updated and modified HEP for maintenance of functional progress. Baseline: To be reviewed/modified. Goal status: INITIAL  ASSESSMENT:  CLINICAL IMPRESSION: PT session focused on trial of Lt AFO (Thuasane AFO trialed as this is clinic's only AFO with posterior strut (other AFO's have anterior guard); pt was informed that he would probably need a plastic custom AFO to control Lt genu recurvatum and to increase foot clearance in swing.  Pt's severe spasticity overpowers the carbon fiber AFO resulting in Lt foot scuffing floor and Lt knee hyperextension continuing to occur.  Cont with POC.   OBJECTIVE IMPAIRMENTS: Abnormal gait, decreased activity tolerance, decreased balance, decreased coordination, decreased endurance, decreased knowledge of condition, decreased mobility, difficulty walking, decreased ROM, decreased strength,  increased muscle spasms, impaired tone, and improper body mechanics.   ACTIVITY LIMITATIONS: carrying, bending, standing, squatting, stairs, transfers, and locomotion level  PARTICIPATION LIMITATIONS: meal prep, cleaning, laundry, driving, shopping, community activity, and yard work  PERSONAL FACTORS: Age, Past/current experiences, Time since onset of injury/illness/exacerbation, and 1-2 comorbidities: ongoing assessment of MS progression, polyneuropathy  are also affecting patient's functional outcome.   REHAB POTENTIAL: Fair due to chronicity of deficits and progressive nature of MS disease process  CLINICAL DECISION MAKING: Stable/uncomplicated  EVALUATION COMPLEXITY: Low  PLAN:  PT FREQUENCY: 1x/week  PT DURATION: 8 weeks  PLANNED INTERVENTIONS: Therapeutic exercises, Therapeutic activity, Neuromuscular re-education, Balance training, Gait training, Patient/Family education, Self Care, Stair training, Orthotic/Fit training, DME instructions, Electrical stimulation,  Taping, Manual therapy, and Re-evaluation  PLAN FOR NEXT SESSION: Reissue HEP if pt requests additional copy; cont strengthening and stretching  Harith Mccadden, Donavan Burnet, PT 02/04/2023, 7:49 PM

## 2023-02-06 ENCOUNTER — Telehealth: Payer: Self-pay | Admitting: Physical Therapy

## 2023-02-06 NOTE — Telephone Encounter (Signed)
Dr. Epimenio Foot, Johnathan Ross is receiving OP PT for spastic diplegia and gait abnormality due to MS.  He would benefit from a Lt AFO to increase safety and efficiency with gait.  If you agree would you please place order in Epic for a Lt AFO and we will assist him in obtaining this orthosis.  In addition to the order there will also have to be documentation in the clinical note from you stating the need for the brace.  You could either addend your last note or he can schedule a face to face visit for this orthosis - whichever you prefer.   Thank you so much, Johnathan Ross, PT

## 2023-02-11 ENCOUNTER — Ambulatory Visit: Payer: Medicare PPO | Admitting: Physical Therapy

## 2023-02-11 ENCOUNTER — Encounter: Payer: Self-pay | Admitting: Physical Therapy

## 2023-02-11 DIAGNOSIS — R29818 Other symptoms and signs involving the nervous system: Secondary | ICD-10-CM

## 2023-02-11 DIAGNOSIS — R2689 Other abnormalities of gait and mobility: Secondary | ICD-10-CM | POA: Diagnosis not present

## 2023-02-11 DIAGNOSIS — R2681 Unsteadiness on feet: Secondary | ICD-10-CM | POA: Diagnosis not present

## 2023-02-11 DIAGNOSIS — M6281 Muscle weakness (generalized): Secondary | ICD-10-CM

## 2023-02-11 DIAGNOSIS — M21371 Foot drop, right foot: Secondary | ICD-10-CM | POA: Diagnosis not present

## 2023-02-11 DIAGNOSIS — R296 Repeated falls: Secondary | ICD-10-CM | POA: Diagnosis not present

## 2023-02-11 DIAGNOSIS — M21372 Foot drop, left foot: Secondary | ICD-10-CM | POA: Diagnosis not present

## 2023-02-11 NOTE — Therapy (Signed)
OUTPATIENT PHYSICAL THERAPY NEURO TREATMENT NOTE   Patient Name: Johnathan Ross MRN: [id number] DOB:1970-06-17, 52 y.o., male Today's Date: 02/11/2023   PCP: Etta Grandchild, MD REFERRING PROVIDER: Asa Lente, MD    PT End of Session - 02/11/23 1142     Visit Number 3    Number of Visits 8   8 Visits approved   Date for PT Re-Evaluation 03/29/23    Authorization Type [employer]ICARE    Authorization Time Period 01-15-23 - 03-29-23    Progress Note Due on Visit 10    PT Start Time 0932    PT Stop Time 1019    PT Time Calculation (min) 47 min    Activity Tolerance Patient tolerated treatment well    Behavior During Therapy [facility] for tasks assessed/performed                Past Medical History:  Diagnosis Date   Hyperlipidemia    Hypertension    Hypogonadism in male 11/01/2016   Hypothyroidism    Multiple sclerosis (HCC)    Neuromuscular disorder (HCC)    Proliferative diabetic retinopathy(362.02)    Type 1 diabetes mellitus (HCC) dx'd 1994   Vitamin D insufficiency 11/29/2016   Past Surgical History:  Procedure Laterality Date   EYE SURGERY Bilateral    "laser for diabetic retinopathy"   FRACTURE SURGERY     IR VENO/EXT/UNI LEFT  03/24/2020   OPEN REDUCTION INTERNAL FIXATION (ORIF) TIBIA/FIBULA FRACTURE Right 10/30/2016   ORIF ANKLE FRACTURE Left 2015   ORIF TIBIA FRACTURE Right 10/30/2016   Procedure: OPEN REDUCTION INTERNAL FIXATION (ORIF) TIBIA FIBULA FRACTURE;  Surgeon: Myrene Galas, MD;  Location: [facility] OR;  Service: Orthopedics;  Laterality: Right;   RETINAL LASER PROCEDURE Bilateral    "for diabetic retinopathy"   Patient Active Problem List   Diagnosis Date Noted   Mass of skin of right lower extremity 08/02/2022   Oropharyngeal dysphagia 08/02/2022   Nasal congestion 08/02/2022   Encounter for general adult medical examination with abnormal findings 08/02/2022   Screening for prostate cancer 08/01/2021   OAB (overactive bladder) 09/21/2020    Essential hypertension 09/21/2020   Urinary dysfunction 09/21/2020   Thoracic outlet syndrome 05/03/2020   Controlled type 1 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 (HCC) 03/24/2020   Right epiretinal membrane 03/24/2020   Retinal exudates and deposits 03/24/2020   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   Nuclear sclerotic cataract of both eyes 03/24/2020   Gait disorder 03/23/2020   Benign prostatic hyperplasia without lower urinary tract symptoms 03/01/2020   Diabetic gastroparesis associated with type 1 diabetes mellitus (HCC) 03/01/2020   Uncontrolled type 1 diabetes mellitus with hyperglycemia (HCC) 03/01/2020   Gait disturbance 08/19/2019   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/19/2019   Vitamin D insufficiency 11/29/2016   Hypogonadism in male 11/01/2016   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HCC)    Hypothyroidism    Hypertension    Abnormal liver function tests 03/06/2013   HYPERCHOLESTEROLEMIA 11/12/2007   MULTIPLE SCLEROSIS 08/29/2007   PROLIFERATIVE DIABETIC RETINOPATHY 08/29/2007    ONSET DATE: 12/19/2022 (referral date)  REFERRING DIAG:  G35 (ICD-10-CM) - Multiple sclerosis (HCC) 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  Pt is having significant trouble advancing LLE in swing phase of gait - pt attempts to amb. From lobby to gym with reciprocal gait pattern - is unable to advance LLE - asks if he can  just pick himself up to walk in order to amb. To gym area Pt accompanied by: girlfriend  PERTINENT HISTORY: vitamin D insufficiency, Type 1 IDDM w/ polyneuropathy, diabetic retinopathy, hypothyroidism, HTN, hypercholestrolemia, h/o Rt tibia fracture, h/o Lt ankle fracture with ORIF, MS with acquired diplegia (MS diagnosis 2014/2015)  PAIN:  Are you having pain? No - shoulder pain has resolved.  He never saw an ortho MD for shoulder.  PRECAUTIONS: Fall  WEIGHT BEARING RESTRICTIONS: No  FALLS: Has patient fallen in last 6 months? Yes. Number of falls 2 at the most, he was in the house trying to pick something up off the floor  LIVING ENVIRONMENT: Lives with:  lives with their partner (girlfriend) Lives in: House/apartment Stairs: Yes; External: 6 steps; on right going up, on left going up, and can reach both Has following equipment at home: Dan Humphreys - 2 wheeled, Wheelchair (manual), and shower chair  PLOF: Independent with basic ADLs, Independent with household mobility with device, Independent with transfers, and Needs assistance with homemaking  PATIENT GOALS: Increased stamina and knee control  OBJECTIVE:   DIAGNOSTIC FINDINGS: MRI of the cervical spine 03/14/2022 shows T2 hyperintense foci posterolaterally to the right adjacent to C2-C3, laterally to the left adjacent to C3-C4 and anterior/central at C4-C5.  These were all present on the MRI from 07/01/2015, there were no new lesions.  Additional note is made of a disc osteophyte complex to the right at C3-C4 that could compress the C4 nerve root and a disc osteophyte complex to the left at the C4-C5 that could affect the left C5 nerve root.  There is spinal stenosis but no nerve root compression at C5-C6.  There is a left disc osteophyte complex at C6-C7 causing foraminal narrowing but no nerve root compression   MRI of the brain 03/14/2022 shows scattered T2/FLAIR hyperintense in the periventricular, juxtacortical and deep white matter of  both cerebral hemispheres.  A focus is also noted in the lateral left pons.  Paired to the MRI dated 08/20/2017, there are no new lesions.   Today's Treatment:  02-11-23  GAIT: Gait pattern:  pt uses vaulting gait pattern in which he lifts himself up and swings bil.LE's (paraplegic gait pattern) for increased speed; when not in hurry attempts reciprocating gait pattern, decreased step length- Left, decreased hip/knee flexion- Right, decreased hip/knee flexion- Left, decreased ankle dorsiflexion- Left, and poor foot clearance- Left Distance walked: approx. 74' from lobby to mat table on near side of gym (vaulting gait pattern):  approx. 56' from mat table to SciFit with RW with reciprocal gait pattern with intermittent assist to flex Lt knee to reduce hyperextension Assistive device utilized: Dan Humphreys - 2 wheeled; pt is wearing his AFO on his RLE Level of assistance: SBA Comments: pt declined use of AFO on LLE at end of session (02-11-23) - wanted to try to walk with reciprocal gait pattern from mat table to SciFit at end of session without use of orthosis  THEREX;  PT performed passive Lt hamstring stretch with pt in supine position - 30 sec hold x 3 reps Bridging 5 reps with min to mod assist to stabilize  LE's in hooklying position Bridging with hip abduction/adduction 5 reps Bridging with marching attempted - pt able to lift RLE but unable to lift LLE due to increased weakness  In Rt sidelying - pt performed active Lt hip flexion/extension with min to mod assist 10 reps x 2 sets Clam shell LLE 10 reps in Rt sidelying Pt performed Lt knee extension/flexion in seated position with min assist for full ROM 10 reps SciFit level 2.5 x 7" with bil. Ue's and LE's (no charge as pt performed unsupervised at end of session)  PATIENT EDUCATION: Education details: Reviewed HEP as previously established and issued; educated pt in type of AFO (custom plastic) to optimally reduce Lt knee hyperextension and to  increase Lt foot clearance simultaneously Person educated: Patient Education method: Explanation Education comprehension: verbalized understanding  HOME EXERCISE PROGRAM: To be reviewed/modified.  [id number] (code from episode prior)             Access Code: [email or web address] URL: [email or web address] Date: 04/12/2022 Prepared by: Maebelle Munroe   Exercises - Seated Hamstring Curl with Anchored Resistance  - 1 x daily - 7 x weekly - 1 sets - 10 reps - AND SEATED HIP ADDUCTION  - 1 x daily - 7 x weekly - 1 sets - 10 reps - 3-5 hold - Long Sitting Hamstring Stretch  - 1 x daily - 7 x weekly - 1 sets - 1-2 reps - 30-60 secs hold  GOALS: Goals reviewed with patient? Yes  SHORT TERM GOALS: Target date: 02/15/2023  Pt will decrease 5xSTS to </=16.53 seconds in order to demonstrate decreased risk for falls and improved functional bilateral LE strength and power. Baseline:  19.53 secs Goal status: INITIAL  2.  Pt will trial AFO options in clinic and compare to need for custom AFO option with order request placed as appropriate. Baseline: To be assessed. Goal status: INITIAL  3.  Pt will demonstrate TUG of </=83 seconds in order to decrease risk of falls and improve functional mobility using LRAD. Baseline: 103 secs with RW Goal status: INITIAL  4.  Pt will undergo review of prior HEP for appropriateness to current functional level and to maintain functional progress and improve mobility.  Baseline: To be reviewed. Goal status: INITIAL  LONG TERM GOALS: Target date: 03/15/2023  Pt will decrease 5xSTS to </=13.53 seconds in order to demonstrate decreased risk for falls and improved functional bilateral LE strength and power. Baseline:  19.53 secs Goal status: INITIAL  2.   Pt will demonstrate floor retrieval of household object using single UE support with return to upright at modI level in order to improve management in home and decrease risk of falls with daily  tasks. Baseline: Picking up objects has caused pt 2 recent falls Goal status: INITIAL  3.   Pt will demonstrate TUG of </=63 seconds in order to decrease risk of falls and improve functional mobility using LRAD. Baseline: 103 secs with RW Goal status: INITIAL  4.  Pt will ambulate >/=100 feet with RW and bilateral AFOs continuously at no more than modI level of assist to promote household and community access. Baseline: 14' using paralytic gait and RW Goal status: INITIAL  5.  Patient will be independent and compliant to updated and modified HEP for maintenance of functional progress. Baseline: To be reviewed/modified. Goal status: INITIAL  ASSESSMENT:  CLINICAL IMPRESSION: PT session focused on strengthening and stretching LLE as pt has significant increased weakness in LLE, especially Lt hip flexors, with pt unable to  ambulate with reciprocal gait pattern with RW from lobby to clinic gym.  Pt unable to advance LLE in swing phase of gait at start of session.   Pt amb. From mat table to SciFit at end of session, approx. 2' with significant energy expenditure required due to difficulty advancing LLE.  Pt required approx. 12" to ambulate this distance of 60'.  Cont with POC.   OBJECTIVE IMPAIRMENTS: Abnormal gait, decreased activity tolerance, decreased balance, decreased coordination, decreased endurance, decreased knowledge of condition, decreased mobility, difficulty walking, decreased ROM, decreased strength, increased muscle spasms, impaired tone, and improper body mechanics.   ACTIVITY LIMITATIONS: carrying, bending, standing, squatting, stairs, transfers, and locomotion level  PARTICIPATION LIMITATIONS: meal prep, cleaning, laundry, driving, shopping, community activity, and yard work  PERSONAL FACTORS: Age, Past/current experiences, Time since onset of injury/illness/exacerbation, and 1-2 comorbidities: ongoing assessment of MS progression, polyneuropathy  are also affecting  patient's functional outcome.   REHAB POTENTIAL: Fair due to chronicity of deficits and progressive nature of MS disease process  CLINICAL DECISION MAKING: Stable/uncomplicated  EVALUATION COMPLEXITY: Low  PLAN:  PT FREQUENCY: 1x/week  PT DURATION: 8 weeks  PLANNED INTERVENTIONS: Therapeutic exercises, Therapeutic activity, Neuromuscular re-education, Balance training, Gait training, Patient/Family education, Self Care, Stair training, Orthotic/Fit training, DME instructions, Electrical stimulation, Taping, Manual therapy, and Re-evaluation  PLAN FOR NEXT SESSION: Check STG's next session: did order for Lt AFO get placed?  cont strengthening and stretching   Choya Tornow, Donavan Burnet, PT 02/11/2023, 11:45 AM

## 2023-02-17 ENCOUNTER — Other Ambulatory Visit: Payer: Self-pay | Admitting: Neurology

## 2023-02-17 DIAGNOSIS — R269 Unspecified abnormalities of gait and mobility: Secondary | ICD-10-CM

## 2023-02-17 DIAGNOSIS — G35 Multiple sclerosis: Secondary | ICD-10-CM

## 2023-02-17 DIAGNOSIS — M21372 Foot drop, left foot: Secondary | ICD-10-CM

## 2023-02-18 ENCOUNTER — Ambulatory Visit: Payer: Medicare PPO | Admitting: Physical Therapy

## 2023-02-18 ENCOUNTER — Encounter: Payer: Self-pay | Admitting: Physical Therapy

## 2023-02-18 DIAGNOSIS — R2681 Unsteadiness on feet: Secondary | ICD-10-CM

## 2023-02-18 DIAGNOSIS — R296 Repeated falls: Secondary | ICD-10-CM

## 2023-02-18 DIAGNOSIS — R29818 Other symptoms and signs involving the nervous system: Secondary | ICD-10-CM

## 2023-02-18 DIAGNOSIS — M21372 Foot drop, left foot: Secondary | ICD-10-CM | POA: Diagnosis not present

## 2023-02-18 DIAGNOSIS — M6281 Muscle weakness (generalized): Secondary | ICD-10-CM | POA: Diagnosis not present

## 2023-02-18 DIAGNOSIS — M21371 Foot drop, right foot: Secondary | ICD-10-CM

## 2023-02-18 DIAGNOSIS — R2689 Other abnormalities of gait and mobility: Secondary | ICD-10-CM

## 2023-02-18 NOTE — Therapy (Unsigned)
OUTPATIENT PHYSICAL THERAPY NEURO TREATMENT NOTE   Patient Name: Johnathan Ross MRN: [id number] DOB:[birth year], 52 y.o., male Today's Date: 02/18/2023   PCP: Etta Grandchild, MD REFERRING PROVIDER: Asa Lente, MD    PT End of Session - 02/18/23 0947     Visit Number 4    Number of Visits 8   8 Visits approved   Date for PT Re-Evaluation 03/29/23    Authorization Type [employer] MEDICARE    Authorization Time Period 01-15-23 - 03-29-23    Progress Note Due on Visit 10    PT Start Time 0933    PT Stop Time 1015    PT Time Calculation (min) 42 min    Equipment Utilized During Treatment Gait belt    Activity Tolerance Patient tolerated treatment well    Behavior During Therapy [facility] for tasks assessed/performed                Past Medical History:  Diagnosis Date   Hyperlipidemia    Hypertension    Hypogonadism in male 11/01/2016   Hypothyroidism    Multiple sclerosis (HCC)    Neuromuscular disorder (HCC)    Proliferative diabetic retinopathy(362.02)    Type 1 diabetes mellitus (HCC) dx'd 1994   Vitamin D insufficiency 11/29/2016   Past Surgical History:  Procedure Laterality Date   EYE SURGERY Bilateral    "laser for diabetic retinopathy"   FRACTURE SURGERY     IR VENO/EXT/UNI LEFT  03/24/2020   OPEN REDUCTION INTERNAL FIXATION (ORIF) TIBIA/FIBULA FRACTURE Right 10/30/2016   ORIF ANKLE FRACTURE Left 2015   ORIF TIBIA FRACTURE Right 10/30/2016   Procedure: OPEN REDUCTION INTERNAL FIXATION (ORIF) TIBIA FIBULA FRACTURE;  Surgeon: Myrene Galas, MD;  Location: [facility] OR;  Service: Orthopedics;  Laterality: Right;   RETINAL LASER PROCEDURE Bilateral    "for diabetic retinopathy"   Patient Active Problem List   Diagnosis Date Noted   Mass of skin of right lower extremity 08/02/2022   Oropharyngeal dysphagia 08/02/2022   Nasal congestion 08/02/2022   Encounter for general adult medical examination with abnormal findings 08/02/2022   Screening for prostate cancer  08/01/2021   OAB (overactive bladder) 09/21/2020   Essential hypertension 09/21/2020   Urinary dysfunction 09/21/2020   Thoracic outlet syndrome 05/03/2020   Controlled type 1 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 (HCC) 03/24/2020   Right epiretinal membrane 03/24/2020   Retinal exudates and deposits 03/24/2020   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   Nuclear sclerotic cataract of both eyes 03/24/2020   Gait disorder 03/23/2020   Benign prostatic hyperplasia without lower urinary tract symptoms 03/01/2020   Diabetic gastroparesis associated with type 1 diabetes mellitus (HCC) 03/01/2020   Uncontrolled type 1 diabetes mellitus with hyperglycemia (HCC) 03/01/2020   Gait disturbance 08/19/2019   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/19/2019   Vitamin D insufficiency 11/29/2016   Hypogonadism in male 11/01/2016   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HCC)    Hypothyroidism    Hypertension    Abnormal liver function tests 03/06/2013   HYPERCHOLESTEROLEMIA 11/12/2007   MULTIPLE SCLEROSIS 08/29/2007   PROLIFERATIVE DIABETIC RETINOPATHY 08/29/2007    ONSET DATE: 12/19/2022 (referral date)  REFERRING DIAG:  G35 (ICD-10-CM) - Multiple sclerosis (HCC) 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)  THERAPY DIAG:  Other abnormalities of gait and mobility  Muscle weakness (generalized)  Other symptoms and signs involving the nervous system  Unsteadiness on feet  Foot drop, right  Foot drop, left  Repeated falls  Rationale  for Evaluation and Treatment Rehabilitation  SUBJECTIVE:                                                                                                                                                                                             SUBJECTIVE STATEMENT: Pt reports his left leg is a lot weaker today - he thought this would be better since last visit where it was also affecting him.  Pt is having significant  trouble advancing LLE in swing phase of gait - pt attempts to ambulate from lobby to gym with reciprocal gait pattern.  He requires several attempts to advance LLE in swing throughout task relying on upper body momentum to substitute for hip and knee flexor momentum.  He denies recent falls.   Pt accompanied by: self  PERTINENT HISTORY: vitamin D insufficiency, Type 1 IDDM w/ polyneuropathy, diabetic retinopathy, hypothyroidism, HTN, hypercholestrolemia, h/o Rt tibia fracture, h/o Lt ankle fracture with ORIF, MS with acquired diplegia (MS diagnosis 2014/2015)  PAIN:  Are you having pain? No - shoulder pain has resolved.  He never saw an ortho MD for shoulder.  PRECAUTIONS: Fall  WEIGHT BEARING RESTRICTIONS: No  FALLS: Has patient fallen in last 6 months? Yes. Number of falls 2 at the most, he was in the house trying to pick something up off the floor  LIVING ENVIRONMENT: Lives with:  lives with their partner (girlfriend) Lives in: House/apartment Stairs: Yes; External: 6 steps; on right going up, on left going up, and can reach both Has following equipment at home: Dan Humphreys - 2 wheeled, Wheelchair (manual), and shower chair  PLOF: Independent with basic ADLs, Independent with household mobility with device, Independent with transfers, and Needs assistance with homemaking  PATIENT GOALS: Increased stamina and knee control  OBJECTIVE:   DIAGNOSTIC FINDINGS: MRI of the cervical spine 03/14/2022 shows T2 hyperintense foci posterolaterally to the right adjacent to C2-C3, laterally to the left adjacent to C3-C4 and anterior/central at C4-C5.  These were all present on the MRI from 07/01/2015, there were no new lesions.  Additional note is made of a disc osteophyte complex to the right at C3-C4 that could compress the C4 nerve root and a disc osteophyte complex to the left at the C4-C5 that could affect the left C5 nerve root.  There is spinal stenosis but no nerve root compression at C5-C6.  There is  a left disc osteophyte complex at C6-C7 causing foraminal narrowing but no nerve root compression   MRI of the brain 03/14/2022 shows scattered T2/FLAIR hyperintense in the periventricular, juxtacortical and deep white matter of both cerebral hemispheres.  A focus is  also noted in the lateral left pons.  Paired to the MRI dated 08/20/2017, there are no new lesions.   Today's Treatment:  02-18-23 -During ambulation into gym PT discussed PWC referral with patient and he states he is not open to this at this time.  PT affirms treatment plan to pursue adaptive equipment with goal of preserving shoulder health and mobility vs overuse injury and AFO order received to be faxed to [facility] for fitting assessment.  Discussed benefits of PWC due to paralytic gait and efficiency, pt confirms that his "vanity" will not allow this.  -5xSTS:  19.91 seconds -TUG w/ RW:  248.96 seconds (reciprocal gait) -TUG w/ RW:  36.93 seconds (paralytic gait)  GAIT: Gait pattern:  pt uses vaulting gait pattern in which he lifts himself up and swings bil.LE's (paraplegic gait pattern) for increased speed; when not in hurry attempts reciprocating gait pattern, decreased step length- Left, decreased hip/knee flexion- Right, decreased hip/knee flexion- Left, decreased ankle dorsiflexion- Left, and poor foot clearance- Left Distance walked: ~100 ft from lobby and to lobby at beginning and end of session  PATIENT EDUCATION: Education details: Progress towards goals, goal of bracing vs PWC options. Person educated: Patient Education method: Explanation Education comprehension: verbalized understanding  HOME EXERCISE PROGRAM: To be reviewed/modified.  [id number] (code from episode prior)             Access Code: [email or web address] URL: [email or web address] Date: 04/12/2022 Prepared by: Maebelle Munroe   Exercises - Seated Hamstring Curl with Anchored Resistance  - 1 x daily - 7 x weekly - 1 sets - 10 reps - AND SEATED HIP  ADDUCTION  - 1 x daily - 7 x weekly - 1 sets - 10 reps - 3-5 hold - Long Sitting Hamstring Stretch  - 1 x daily - 7 x weekly - 1 sets - 1-2 reps - 30-60 secs hold  GOALS: Goals reviewed with patient? Yes  SHORT TERM GOALS: Target date: 02/15/2023  Pt will decrease 5xSTS to </=16.53 seconds in order to demonstrate decreased risk for falls and improved functional bilateral LE strength and power. Baseline:  19.53 secs; 19.91 seconds (9/23) Goal status: NOT MET  2.  Pt will trial AFO options in clinic and compare to need for custom AFO option with order request placed as appropriate. Baseline: Order received, to be faxed to [facility] (9/23) Goal status: MET  3.  Pt will demonstrate TUG of </=83 seconds in order to decrease risk of falls and improve functional mobility using LRAD. Baseline: 103 secs with RW; 248.96 seconds (reciprocal gait -02/18/2023) Goal status: NOT MET  4.  Pt will undergo review of prior HEP for appropriateness to current functional level and to maintain functional progress and improve mobility.  Baseline: Reviewed at session prior (9/23) Goal status: MET  LONG TERM GOALS: Target date: 03/15/2023  Pt will decrease 5xSTS to </=13.53 seconds in order to demonstrate decreased risk for falls and improved functional bilateral LE strength and power. Baseline:  19.53 secs Goal status: INITIAL  2.   Pt will demonstrate floor retrieval of household object using single UE support with return to upright at modI level in order to improve management in home and decrease risk of falls with daily tasks. Baseline: Picking up objects has caused pt 2 recent falls Goal status: INITIAL  3.   Pt will demonstrate TUG of </=63 seconds in order to decrease risk of falls and improve functional mobility using LRAD. Baseline: 103 secs with RW Goal status: INITIAL  4.  Pt will ambulate >/=100 feet with RW and bilateral AFOs continuously at no more than modI level of assist to promote household  and community access. Baseline: 53' using paralytic gait and RW Goal status: INITIAL  5.  Patient will be independent and compliant to updated and modified HEP for maintenance of functional progress. Baseline: To be reviewed/modified. Goal status: INITIAL  ASSESSMENT:  CLINICAL IMPRESSION: Assessed STGs this session with patient making modest progress.  His 5xSTS remains essentially unchanged at 19.91 seconds, but his TUG took more time this visit requiring 248.96 seconds when performed reciprocally.  His AFO order will be faxed in coming days for further assessment and fitting at [facility] clinic.  His HEP has been reviewed and PT will update as appropriate in future sessions.  OBJECTIVE IMPAIRMENTS: Abnormal gait, decreased activity tolerance, decreased balance, decreased coordination, decreased endurance, decreased knowledge of condition, decreased mobility, difficulty walking, decreased ROM, decreased strength, increased muscle spasms, impaired tone, and improper body mechanics.   ACTIVITY LIMITATIONS: carrying, bending, standing, squatting, stairs, transfers, and locomotion level  PARTICIPATION LIMITATIONS: meal prep, cleaning, laundry, driving, shopping, community activity, and yard work  PERSONAL FACTORS: Age, Past/current experiences, Time since onset of injury/illness/exacerbation, and 1-2 comorbidities: ongoing assessment of MS progression, polyneuropathy  are also affecting patient's functional outcome.   REHAB POTENTIAL: Fair due to chronicity of deficits and progressive nature of MS disease process  CLINICAL DECISION MAKING: Stable/uncomplicated  EVALUATION COMPLEXITY: Low  PLAN:  PT FREQUENCY: 1x/week  PT DURATION: 8 weeks  PLANNED INTERVENTIONS: Therapeutic exercises, Therapeutic activity, Neuromuscular re-education, Balance training, Gait training, Patient/Family education, Self Care, Stair training, Orthotic/Fit training, DME instructions, Electrical stimulation,  Taping, Manual therapy, and Re-evaluation  PLAN FOR NEXT SESSION: did order for Lt AFO get placed?  cont strengthening and stretching - shoulder and core strength, hip flexor/hamstring/DF mobility on left LE   Sadie Haber, PT, DPT 02/18/2023, 10:15 AM

## 2023-02-22 ENCOUNTER — Other Ambulatory Visit: Payer: Self-pay | Admitting: Internal Medicine

## 2023-02-22 DIAGNOSIS — N3281 Overactive bladder: Secondary | ICD-10-CM

## 2023-02-25 ENCOUNTER — Ambulatory Visit: Payer: Medicare PPO | Admitting: Physical Therapy

## 2023-02-25 ENCOUNTER — Encounter: Payer: Self-pay | Admitting: Physical Therapy

## 2023-02-25 DIAGNOSIS — M6281 Muscle weakness (generalized): Secondary | ICD-10-CM | POA: Diagnosis not present

## 2023-02-25 DIAGNOSIS — R29818 Other symptoms and signs involving the nervous system: Secondary | ICD-10-CM | POA: Diagnosis not present

## 2023-02-25 DIAGNOSIS — R296 Repeated falls: Secondary | ICD-10-CM | POA: Diagnosis not present

## 2023-02-25 DIAGNOSIS — R2681 Unsteadiness on feet: Secondary | ICD-10-CM | POA: Diagnosis not present

## 2023-02-25 DIAGNOSIS — M21372 Foot drop, left foot: Secondary | ICD-10-CM | POA: Diagnosis not present

## 2023-02-25 DIAGNOSIS — R2689 Other abnormalities of gait and mobility: Secondary | ICD-10-CM

## 2023-02-25 DIAGNOSIS — M21371 Foot drop, right foot: Secondary | ICD-10-CM | POA: Diagnosis not present

## 2023-02-25 NOTE — Therapy (Signed)
OUTPATIENT PHYSICAL THERAPY NEURO TREATMENT NOTE   Patient Name: Johnathan Ross MRN: [id number] DOB:07-12-1970, 52 y.o., male Today's Date: 02/25/2023   PCP: Etta Grandchild, MD REFERRING PROVIDER: Asa Lente, MD    PT End of Session - 02/25/23 1221     Visit Number 5    Number of Visits 8   8 Visits approved   Date for PT Re-Evaluation 03/29/23    Authorization Type [employer]ICARE    Authorization Time Period 01-15-23 - 03-29-23    Progress Note Due on Visit 10    PT Start Time 0932    PT Stop Time 1024    PT Time Calculation (min) 52 min    Equipment Utilized During Treatment Gait belt;Other (comment)   powder board   Activity Tolerance Patient tolerated treatment well    Behavior During Therapy [facility] for tasks assessed/performed                 Past Medical History:  Diagnosis Date   Hyperlipidemia    Hypertension    Hypogonadism in male 11/01/2016   Hypothyroidism    Multiple sclerosis (HCC)    Neuromuscular disorder (HCC)    Proliferative diabetic retinopathy(362.02)    Type 1 diabetes mellitus (HCC) dx'd 1994   Vitamin D insufficiency 11/29/2016   Past Surgical History:  Procedure Laterality Date   EYE SURGERY Bilateral    "laser for diabetic retinopathy"   FRACTURE SURGERY     IR VENO/EXT/UNI LEFT  03/24/2020   OPEN REDUCTION INTERNAL FIXATION (ORIF) TIBIA/FIBULA FRACTURE Right 10/30/2016   ORIF ANKLE FRACTURE Left 2015   ORIF TIBIA FRACTURE Right 10/30/2016   Procedure: OPEN REDUCTION INTERNAL FIXATION (ORIF) TIBIA FIBULA FRACTURE;  Surgeon: Myrene Galas, MD;  Location: [facility] OR;  Service: Orthopedics;  Laterality: Right;   RETINAL LASER PROCEDURE Bilateral    "for diabetic retinopathy"   Patient Active Problem List   Diagnosis Date Noted   Mass of skin of right lower extremity 08/02/2022   Oropharyngeal dysphagia 08/02/2022   Nasal congestion 08/02/2022   Encounter for general adult medical examination with abnormal findings 08/02/2022    Screening for prostate cancer 08/01/2021   OAB (overactive bladder) 09/21/2020   Essential hypertension 09/21/2020   Urinary dysfunction 09/21/2020   Thoracic outlet syndrome 05/03/2020   Controlled type 1 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 (HCC) 03/24/2020   Right epiretinal membrane 03/24/2020   Retinal exudates and deposits 03/24/2020   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   Nuclear sclerotic cataract of both eyes 03/24/2020   Gait disorder 03/23/2020   Benign prostatic hyperplasia without lower urinary tract symptoms 03/01/2020   Diabetic gastroparesis associated with type 1 diabetes mellitus (HCC) 03/01/2020   Uncontrolled type 1 diabetes mellitus with hyperglycemia (HCC) 03/01/2020   Gait disturbance 08/19/2019   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/19/2019   Vitamin D insufficiency 11/29/2016   Hypogonadism in male 11/01/2016   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HCC)    Hypothyroidism    Hypertension    Abnormal liver function tests 03/06/2013   HYPERCHOLESTEROLEMIA 11/12/2007   MULTIPLE SCLEROSIS 08/29/2007   PROLIFERATIVE DIABETIC RETINOPATHY 08/29/2007    ONSET DATE: 12/19/2022 (referral date)  REFERRING DIAG:  G35 (ICD-10-CM) - Multiple sclerosis (HCC) 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)  THERAPY DIAG:  Muscle weakness (generalized)  Other abnormalities of gait and mobility  Other symptoms and signs involving the nervous system  Rationale for Evaluation and Treatment Rehabilitation  SUBJECTIVE:  SUBJECTIVE STATEMENT: Pt reports his walking is better today than it was last week; able to ambulate from clinic lobby to 2nd mat table on near side of gym with reciprocal gait pattern for 1/2 the distance; pt picked himself up for  swing through gait for latter half of this distance (last 50' of approx. 100').  Has not heard from [facility] - instructed pt to call [facility] on Wed. This week if he has not heard from them.  Pt verbalized understanding.  Pt accompanied by: self  PERTINENT HISTORY: vitamin D insufficiency, Type 1 IDDM w/ polyneuropathy, diabetic retinopathy, hypothyroidism, HTN, hypercholestrolemia, h/o Rt tibia fracture, h/o Lt ankle fracture with ORIF, MS with acquired diplegia (MS diagnosis 2014/2015)  PAIN:  Are you having pain? No - shoulder pain has resolved.  He never saw an ortho MD for shoulder.  PRECAUTIONS: Fall  WEIGHT BEARING RESTRICTIONS: No  FALLS: Has patient fallen in last 6 months? Yes. Number of falls 2 at the most, he was in the house trying to pick something up off the floor  LIVING ENVIRONMENT: Lives with:  lives with their partner (girlfriend) Lives in: House/apartment Stairs: Yes; External: 6 steps; on right going up, on left going up, and can reach both Has following equipment at home: Dan Humphreys - 2 wheeled, Wheelchair (manual), and shower chair  PLOF: Independent with basic ADLs, Independent with household mobility with device, Independent with transfers, and Needs assistance with homemaking  PATIENT GOALS: Increased stamina and knee control  OBJECTIVE:   DIAGNOSTIC FINDINGS: MRI of the cervical spine 03/14/2022 shows T2 hyperintense foci posterolaterally to the right adjacent to C2-C3, laterally to the left adjacent to C3-C4 and anterior/central at C4-C5.  These were all present on the MRI from 07/01/2015, there were no new lesions.  Additional note is made of a disc osteophyte complex to the right at C3-C4 that could compress the C4 nerve root and a disc osteophyte complex to the left at the C4-C5 that could affect the left C5 nerve root.  There is spinal stenosis but no nerve root compression at C5-C6.  There is a left disc osteophyte complex at C6-C7 causing foraminal narrowing but no  nerve root compression   MRI of the brain 03/14/2022 shows scattered T2/FLAIR hyperintense in the periventricular, juxtacortical and deep white matter of both cerebral hemispheres.  A focus is also noted in the lateral left pons.  Paired to the MRI dated 08/20/2017, there are no new lesions.   Today's Treatment:  02-25-23   GAIT: Gait pattern:  pt uses vaulting gait pattern in which he lifts himself up and swings bil.LE's (paraplegic gait pattern) for increased speed; when not in hurry attempts reciprocating gait pattern, decreased step length- Left, decreased hip/knee flexion- Right, decreased hip/knee flexion- Left, decreased ankle dorsiflexion- Left, and poor foot clearance- Left Distance walked: ~100 ft from lobby and 2nd mat table on near side of gym:  approx. 150' from SciFit to lobby at end of session  THEREX:   Bridging with bil. LE's on red physioball with knees positioned in flexion due to extensor tone with knees extended; pt performed 10 reps x 2 sets 3-4 sec hold Bil. Knees to chest for hip flexor and hamstring strengthening - bil. LE's on red physioball with min to mod assist for full ROM - 10 reps x 1 set; then performed unilaterally with other leg positioned in hooklying position- 10 reps x 2 sets RLE and then LLE LAQ's 10 reps each leg; knees flexed with lower  leg on physioball  - PT's arm under pt's lower leg on ball for increased leverage for increased ease and AROM SciFit level 3.5 with bil. UE's and LE's for strengthening and ROM  NeuroRe-ed: Pt performed hamstring strengthening RLE and LLE in sidelying with use of powder board; pillowcase placed on each foot to reduce friction to increase ease of movement; pt performed 15 reps RLE and LLE with min to mod assist for full ROM    PATIENT EDUCATION: Education details: Progress towards goals, goal of bracing vs PWC options. Person educated: Patient Education method: Explanation Education comprehension: verbalized  understanding  HOME EXERCISE PROGRAM: To be reviewed/modified.  [id number] (code from episode prior)             Access Code: [email or web address] URL: [email or web address] Date: 04/12/2022 Prepared by: Maebelle Munroe   Exercises - Seated Hamstring Curl with Anchored Resistance  - 1 x daily - 7 x weekly - 1 sets - 10 reps - AND SEATED HIP ADDUCTION  - 1 x daily - 7 x weekly - 1 sets - 10 reps - 3-5 hold - Long Sitting Hamstring Stretch  - 1 x daily - 7 x weekly - 1 sets - 1-2 reps - 30-60 secs hold  GOALS: Goals reviewed with patient? Yes  SHORT TERM GOALS: Target date: 02/15/2023  Pt will decrease 5xSTS to </=16.53 seconds in order to demonstrate decreased risk for falls and improved functional bilateral LE strength and power. Baseline:  19.53 secs; 19.91 seconds (9/23) Goal status: NOT MET  2.  Pt will trial AFO options in clinic and compare to need for custom AFO option with order request placed as appropriate. Baseline: Order received, to be faxed to [facility] (9/23) Goal status: MET  3.  Pt will demonstrate TUG of </=83 seconds in order to decrease risk of falls and improve functional mobility using LRAD. Baseline: 103 secs with RW; 248.96 seconds (reciprocal gait -02/18/2023) Goal status: NOT MET  4.  Pt will undergo review of prior HEP for appropriateness to current functional level and to maintain functional progress and improve mobility.  Baseline: Reviewed at session prior (9/23) Goal status: MET  LONG TERM GOALS: Target date: 03/15/2023  Pt will decrease 5xSTS to </=13.53 seconds in order to demonstrate decreased risk for falls and improved functional bilateral LE strength and power. Baseline:  19.53 secs Goal status: INITIAL  2.   Pt will demonstrate floor retrieval of household object using single UE support with return to upright at modI level in order to improve management in home and decrease risk of falls with daily tasks. Baseline: Picking up objects has  caused pt 2 recent falls Goal status: INITIAL  3.   Pt will demonstrate TUG of </=63 seconds in order to decrease risk of falls and improve functional mobility using LRAD. Baseline: 103 secs with RW Goal status: INITIAL  4.  Pt will ambulate >/=100 feet with RW and bilateral AFOs continuously at no more than modI level of assist to promote household and community access. Baseline: 61' using paralytic gait and RW Goal status: INITIAL  5.  Patient will be independent and compliant to updated and modified HEP for maintenance of functional progress. Baseline: To be reviewed/modified. Goal status: INITIAL  ASSESSMENT:  CLINICAL IMPRESSION: PT session focused on bil. Hip flexor and extensor strengthening as well as bil. Quads and hamstring strengthening.  Pt able to actively flex Rt and Lt knee in sidelying (gravity eliminated) position on powder board but needed min to mod  assist intermittently for last 1/2 of ROM; Rt hamstrings fatigued after approx. 3 reps and Lt hamstrings fatigued after approx. 5 reps.  Pt unable to flex either knee in seated position and unable to initiate flexion in supine position with knees extended.  Cont with POC.   OBJECTIVE IMPAIRMENTS: Abnormal gait, decreased activity tolerance, decreased balance, decreased coordination, decreased endurance, decreased knowledge of condition, decreased mobility, difficulty walking, decreased ROM, decreased strength, increased muscle spasms, impaired tone, and improper body mechanics.   ACTIVITY LIMITATIONS: carrying, bending, standing, squatting, stairs, transfers, and locomotion level  PARTICIPATION LIMITATIONS: meal prep, cleaning, laundry, driving, shopping, community activity, and yard work  PERSONAL FACTORS: Age, Past/current experiences, Time since onset of injury/illness/exacerbation, and 1-2 comorbidities: ongoing assessment of MS progression, polyneuropathy  are also affecting patient's functional outcome.   REHAB  POTENTIAL: Fair due to chronicity of deficits and progressive nature of MS disease process  CLINICAL DECISION MAKING: Stable/uncomplicated  EVALUATION COMPLEXITY: Low  PLAN:  PT FREQUENCY: 1x/week  PT DURATION: 8 weeks  PLANNED INTERVENTIONS: Therapeutic exercises, Therapeutic activity, Neuromuscular re-education, Balance training, Gait training, Patient/Family education, Self Care, Stair training, Orthotic/Fit training, DME instructions, Electrical stimulation, Taping, Manual therapy, and Re-evaluation  PLAN FOR NEXT SESSION: Has pt heard from [facility]?   cont strengthening and stretching - shoulder and core strength, hip flexor/hamstring/DF mobility on left LE   Demontay Grantham, Donavan Burnet, PT 02/25/2023, 6:16 PM

## 2023-02-26 ENCOUNTER — Other Ambulatory Visit: Payer: Self-pay

## 2023-02-26 ENCOUNTER — Telehealth: Payer: Self-pay | Admitting: Internal Medicine

## 2023-02-26 DIAGNOSIS — G35 Multiple sclerosis: Secondary | ICD-10-CM

## 2023-02-26 DIAGNOSIS — R269 Unspecified abnormalities of gait and mobility: Secondary | ICD-10-CM

## 2023-02-26 NOTE — Telephone Encounter (Signed)
Prescription Request  02/26/2023  LOV: 08/02/2022  What is the name of the medication or equipment? solifenacin (VESICARE) 5 MG tablet [   Have you contacted your pharmacy to request a refill? No   Which pharmacy would you like this sent to?     [id number] - [place], [place] - [address] AT [facility] [address] [place] [address] Phone: [phone] Fax: [phone]  Patient notified that their request is being sent to the clinical staff for review and that they should receive a response within 2 business days.   Please advise at Mobile [phone] (mobile)

## 2023-03-01 ENCOUNTER — Other Ambulatory Visit: Payer: Self-pay | Admitting: Neurology

## 2023-03-01 NOTE — Telephone Encounter (Signed)
LVM to discuss

## 2023-03-04 NOTE — Telephone Encounter (Signed)
Pt want to discuss why he need to come in if he was seen in March explained to him pt wants to speak with a nurse.

## 2023-03-04 NOTE — Telephone Encounter (Signed)
Last seen on 12/19/22 No 6 month follow up scheduled

## 2023-03-05 ENCOUNTER — Encounter: Payer: Self-pay | Admitting: Physical Therapy

## 2023-03-05 ENCOUNTER — Other Ambulatory Visit: Payer: Self-pay | Admitting: Neurology

## 2023-03-05 ENCOUNTER — Ambulatory Visit: Payer: Medicare PPO | Attending: Neurology | Admitting: Physical Therapy

## 2023-03-05 ENCOUNTER — Telehealth: Payer: Self-pay | Admitting: Physical Therapy

## 2023-03-05 ENCOUNTER — Other Ambulatory Visit: Payer: Self-pay

## 2023-03-05 DIAGNOSIS — R262 Difficulty in walking, not elsewhere classified: Secondary | ICD-10-CM | POA: Insufficient documentation

## 2023-03-05 DIAGNOSIS — R2681 Unsteadiness on feet: Secondary | ICD-10-CM | POA: Insufficient documentation

## 2023-03-05 DIAGNOSIS — R29818 Other symptoms and signs involving the nervous system: Secondary | ICD-10-CM | POA: Insufficient documentation

## 2023-03-05 DIAGNOSIS — M6281 Muscle weakness (generalized): Secondary | ICD-10-CM | POA: Diagnosis not present

## 2023-03-05 DIAGNOSIS — M21371 Foot drop, right foot: Secondary | ICD-10-CM | POA: Insufficient documentation

## 2023-03-05 DIAGNOSIS — R269 Unspecified abnormalities of gait and mobility: Secondary | ICD-10-CM

## 2023-03-05 DIAGNOSIS — R296 Repeated falls: Secondary | ICD-10-CM | POA: Insufficient documentation

## 2023-03-05 DIAGNOSIS — R2689 Other abnormalities of gait and mobility: Secondary | ICD-10-CM | POA: Diagnosis not present

## 2023-03-05 DIAGNOSIS — G35 Multiple sclerosis: Secondary | ICD-10-CM

## 2023-03-05 DIAGNOSIS — M21372 Foot drop, left foot: Secondary | ICD-10-CM | POA: Insufficient documentation

## 2023-03-05 MED ORDER — DALFAMPRIDINE ER 10 MG PO TB12
10.0000 mg | ORAL_TABLET | Freq: Two times a day (BID) | ORAL | 1 refills | Status: DC
Start: 2023-03-05 — End: 2023-06-20

## 2023-03-05 NOTE — Therapy (Signed)
OUTPATIENT PHYSICAL THERAPY NEURO TREATMENT NOTE   Patient Name: Johnathan Ross MRN: [id number] DOB:12-17-1970, 52 y.o., male Today's Date: 03/05/2023   PCP: Etta Grandchild, MD REFERRING PROVIDER: Asa Lente, MD    PT End of Session - 03/05/23 [phone]     Visit Number 6    Number of Visits 8   8 Visits approved   Date for PT Re-Evaluation 03/29/23    Authorization Type [employer]IC[employer]    Authorization Time Period 01-15-23 - 03-29-23    Progress Note Due on Visit 10    PT Start Time 0930    PT Stop Time 1012    PT Time Calculation (min) 42 min    Equipment Utilized During Treatment Gait belt    Activity Tolerance Patient tolerated treatment well    Behavior During Therapy [facility] for tasks assessed/performed                 Past Medical History:  Diagnosis Date   Hyperlipidemia    Hypertension    Hypogonadism in male 11/01/2016   Hypothyroidism    Multiple sclerosis (HCC)    Neuromuscular disorder (HCC)    Proliferative diabetic retinopathy(362.02)    Type 1 diabetes mellitus (HCC) dx'd 1994   Vitamin D insufficiency 11/29/2016   Past Surgical History:  Procedure Laterality Date   EYE SURGERY Bilateral    "laser for diabetic retinopathy"   FRACTURE SURGERY     IR VENO/EXT/UNI LEFT  03/24/2020   OPEN REDUCTION INTERNAL FIXATION (ORIF) TIBIA/FIBULA FRACTURE Right 10/30/2016   ORIF ANKLE FRACTURE Left 2015   ORIF TIBIA FRACTURE Right 10/30/2016   Procedure: OPEN REDUCTION INTERNAL FIXATION (ORIF) TIBIA FIBULA FRACTURE;  Surgeon: Myrene Galas, MD;  Location: [facility] OR;  Service: Orthopedics;  Laterality: Right;   RETINAL LASER PROCEDURE Bilateral    "for diabetic retinopathy"   Patient Active Problem List   Diagnosis Date Noted   Mass of skin of right lower extremity 08/02/2022   Oropharyngeal dysphagia 08/02/2022   Nasal congestion 08/02/2022   Encounter for general adult medical examination with abnormal findings 08/02/2022   Screening for prostate cancer  08/01/2021   OAB (overactive bladder) 09/21/2020   Essential hypertension 09/21/2020   Urinary dysfunction 09/21/2020   Thoracic outlet syndrome 05/03/2020   Controlled type 1 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 (HCC) 03/24/2020   Right epiretinal membrane 03/24/2020   Retinal exudates and deposits 03/24/2020   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   Nuclear sclerotic cataract of both eyes 03/24/2020   Gait disorder 03/23/2020   Benign prostatic hyperplasia without lower urinary tract symptoms 03/01/2020   Diabetic gastroparesis associated with type 1 diabetes mellitus (HCC) 03/01/2020   Uncontrolled type 1 diabetes mellitus with hyperglycemia (HCC) 03/01/2020   Gait disturbance 08/19/2019   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/19/2019   Vitamin D insufficiency 11/29/2016   Hypogonadism in male 11/01/2016   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HCC)    Hypothyroidism    Hypertension    Abnormal liver function tests 03/06/2013   HYPERCHOLESTEROLEMIA 11/12/2007   MULTIPLE SCLEROSIS 08/29/2007   PROLIFERATIVE DIABETIC RETINOPATHY 08/29/2007    ONSET DATE: 12/19/2022 (referral date)  REFERRING DIAG:  G35 (ICD-10-CM) - Multiple sclerosis (HCC) 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)  THERAPY DIAG:  Muscle weakness (generalized)  Other abnormalities of gait and mobility  Other symptoms and signs involving the nervous system  Unsteadiness on feet  Foot drop, right  Foot drop, left  Rationale for Evaluation  and Treatment Rehabilitation  SUBJECTIVE:                                                                                                                                                                                             SUBJECTIVE STATEMENT: Pt reports his walking is better today than the recent days.  He had his baclofen refilled, but never ran out so he has not gone without it.  He is able to ambulate most of  distance into clinic today reciprocally w/ better pace and less difficulty turning.  He is interested in obtaining a PWC and inquires about process for this.  He denies pain or recent falls. Pt accompanied by: self  PERTINENT HISTORY: vitamin D insufficiency, Type 1 IDDM w/ polyneuropathy, diabetic retinopathy, hypothyroidism, HTN, hypercholestrolemia, h/o Rt tibia fracture, h/o Lt ankle fracture with ORIF, MS with acquired diplegia (MS diagnosis 2014/2015)  PAIN:  Are you having pain? No - shoulder pain has resolved.  He never saw an ortho MD for shoulder.  PRECAUTIONS: Fall  WEIGHT BEARING RESTRICTIONS: No  FALLS: Has patient fallen in last 6 months? Yes. Number of falls 2 at the most, he was in the house trying to pick something up off the floor  LIVING ENVIRONMENT: Lives with:  lives with their partner (girlfriend) Lives in: House/apartment Stairs: Yes; External: 6 steps; on right going up, on left going up, and can reach both Has following equipment at home: Dan Humphreys - 2 wheeled, Wheelchair (manual), and shower chair  PLOF: Independent with basic ADLs, Independent with household mobility with device, Independent with transfers, and Needs assistance with homemaking  PATIENT GOALS: Increased stamina and knee control  OBJECTIVE:   DIAGNOSTIC FINDINGS: MRI of the cervical spine 03/14/2022 shows T2 hyperintense foci posterolaterally to the right adjacent to C2-C3, laterally to the left adjacent to C3-C4 and anterior/central at C4-C5.  These were all present on the MRI from 07/01/2015, there were no new lesions.  Additional note is made of a disc osteophyte complex to the right at C3-C4 that could compress the C4 nerve root and a disc osteophyte complex to the left at the C4-C5 that could affect the left C5 nerve root.  There is spinal stenosis but no nerve root compression at C5-C6.  There is a left disc osteophyte complex at C6-C7 causing foraminal narrowing but no nerve root compression    MRI of the brain 03/14/2022 shows scattered T2/FLAIR hyperintense in the periventricular, juxtacortical and deep white matter of both cerebral hemispheres.  A focus is also noted in the lateral left pons.  Paired to the MRI dated 08/20/2017, there are no  new lesions.   Today's Treatment:  03-05-23 TherEx: -Supine bridges on airex w/ blue theraband above knees for increased hip activation and form assist x15 > repeated on BOSU firm side w/ PT holding left foot as pt did not want AFO removed and second person assist to stabilize left foot due to hamstring weakness making elevated hooklying difficult  NeuroRe-ed: -In tall kneeling 6lb ball raise x5 rounds varying reps, intermittent CGA to lower into squatted sitting EOM for safety  -In quadruped pt performs 3 rounds of varying reach blaze pod taps using 6 pods for core and isometric hamstring activation and LE weight bearing for tone management.  Each round 1 minute w/ 40, 36, and 43 hits using alt UE.  Pt reports tone kicking in w/ farthest reaches, no visible kick out of LE, SBA.  GAIT (TA): Gait pattern:  pt uses vaulting gait pattern in which he lifts himself up and swings bil.LE's (paraplegic gait pattern) for increased speed; when not in hurry attempts reciprocating gait pattern, decreased step length- Left, decreased hip/knee flexion- Right, decreased hip/knee flexion- Left, decreased ankle dorsiflexion- Left, and poor foot clearance- Left Distance walked: ~100 ft from lobby and 2nd mat table on near side of gym  PATIENT EDUCATION: Education details: Process for obtaining PWC referral.  Goal of interventions of strengthening and tone management today.  Ongoing education about fatigue and post-exercise response.  Discussed where we are in process of him obtaining left AFO - referral just re-faxed 03/05/2023.   Person educated: Patient Education method: Explanation Education comprehension: verbalized understanding  HOME EXERCISE PROGRAM: To  be reviewed/modified.  [id number] (code from episode prior)             Access Code: [email or web address] URL: [email or web address] Date: 04/12/2022 Prepared by: Maebelle Munroe   Exercises - Seated Hamstring Curl with Anchored Resistance  - 1 x daily - 7 x weekly - 1 sets - 10 reps - AND SEATED HIP ADDUCTION  - 1 x daily - 7 x weekly - 1 sets - 10 reps - 3-5 hold - Long Sitting Hamstring Stretch  - 1 x daily - 7 x weekly - 1 sets - 1-2 reps - 30-60 secs hold  GOALS: Goals reviewed with patient? Yes  SHORT TERM GOALS: Target date: 02/15/2023  Pt will decrease 5xSTS to </=16.53 seconds in order to demonstrate decreased risk for falls and improved functional bilateral LE strength and power. Baseline:  19.53 secs; 19.91 seconds (9/23) Goal status: NOT MET  2.  Pt will trial AFO options in clinic and compare to need for custom AFO option with order request placed as appropriate. Baseline: Order received, to be faxed to [facility] (9/23) Goal status: MET  3.  Pt will demonstrate TUG of </=83 seconds in order to decrease risk of falls and improve functional mobility using LRAD. Baseline: 103 secs with RW; 248.96 seconds (reciprocal gait -02/18/2023) Goal status: NOT MET  4.  Pt will undergo review of prior HEP for appropriateness to current functional level and to maintain functional progress and improve mobility.  Baseline: Reviewed at session prior (9/23) Goal status: MET  LONG TERM GOALS: Target date: 03/15/2023  Pt will decrease 5xSTS to </=13.53 seconds in order to demonstrate decreased risk for falls and improved functional bilateral LE strength and power. Baseline:  19.53 secs Goal status: INITIAL  2.   Pt will demonstrate floor retrieval of household object using single UE support with return to upright at modI level in order to improve management in  home and decrease risk of falls with daily tasks. Baseline: Picking up objects has caused pt 2 recent falls Goal status:  INITIAL  3.   Pt will demonstrate TUG of </=63 seconds in order to decrease risk of falls and improve functional mobility using LRAD. Baseline: 103 secs with RW Goal status: INITIAL  4.  Pt will ambulate >/=100 feet with RW and bilateral AFOs continuously at no more than modI level of assist to promote household and community access. Baseline: 76' using paralytic gait and RW Goal status: INITIAL  5.  Patient will be independent and compliant to updated and modified HEP for maintenance of functional progress. Baseline: To be reviewed/modified. Goal status: INITIAL  ASSESSMENT:  CLINICAL IMPRESSION: Skilled PT session completed today focused on posterolateral hip musculature activation, isometric hamstring work, and tone management w/ extension activities.  He has some tone during quadruped tasks induced by trunk and arm elongation.  He manages this well using compensatory strategies to lengthen upper body to complete task with adequate posterior weight shift to manage tone.  He expressed interest in obtaining a PWC referral for evaluation today so PT to request from managing neurologist.  OBJECTIVE IMPAIRMENTS: Abnormal gait, decreased activity tolerance, decreased balance, decreased coordination, decreased endurance, decreased knowledge of condition, decreased mobility, difficulty walking, decreased ROM, decreased strength, increased muscle spasms, impaired tone, and improper body mechanics.   ACTIVITY LIMITATIONS: carrying, bending, standing, squatting, stairs, transfers, and locomotion level  PARTICIPATION LIMITATIONS: meal prep, cleaning, laundry, driving, shopping, community activity, and yard work  PERSONAL FACTORS: Age, Past/current experiences, Time since onset of injury/illness/exacerbation, and 1-2 comorbidities: ongoing assessment of MS progression, polyneuropathy  are also affecting patient's functional outcome.   REHAB POTENTIAL: Fair due to chronicity of deficits and  progressive nature of MS disease process  CLINICAL DECISION MAKING: Stable/uncomplicated  EVALUATION COMPLEXITY: Low  PLAN:  PT FREQUENCY: 1x/week  PT DURATION: 8 weeks  PLANNED INTERVENTIONS: Therapeutic exercises, Therapeutic activity, Neuromuscular re-education, Balance training, Gait training, Patient/Family education, Self Care, Stair training, Orthotic/Fit training, DME instructions, Electrical stimulation, Taping, Manual therapy, and Re-evaluation  PLAN FOR NEXT SESSION: Has pt heard from [facility]?  ASK PREFERRED PWC VENDOR!   cont strengthening and stretching - shoulder and core strength, hip flexor/hamstring/DF mobility on left LE   Sadie Haber, PT, DPT 03/05/2023, 10:21 AM

## 2023-03-05 NOTE — Telephone Encounter (Signed)
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  The patient would benefit from a power wheelchair evaluation.   If you agree, please place an order in [facility] workque in [facility] or fax the order to [phone]. Thank you, Camille Bal, PT, DPT  [facility] [address] [place], [place]  [address] Phone:  [phone] Fax:  [phone]

## 2023-03-11 ENCOUNTER — Encounter: Payer: Self-pay | Admitting: Physical Therapy

## 2023-03-11 ENCOUNTER — Ambulatory Visit: Payer: Medicare PPO | Admitting: Physical Therapy

## 2023-03-11 DIAGNOSIS — M21371 Foot drop, right foot: Secondary | ICD-10-CM | POA: Diagnosis not present

## 2023-03-11 DIAGNOSIS — R262 Difficulty in walking, not elsewhere classified: Secondary | ICD-10-CM | POA: Diagnosis not present

## 2023-03-11 DIAGNOSIS — R29818 Other symptoms and signs involving the nervous system: Secondary | ICD-10-CM

## 2023-03-11 DIAGNOSIS — M6281 Muscle weakness (generalized): Secondary | ICD-10-CM | POA: Diagnosis not present

## 2023-03-11 DIAGNOSIS — R2689 Other abnormalities of gait and mobility: Secondary | ICD-10-CM | POA: Diagnosis not present

## 2023-03-11 DIAGNOSIS — M21372 Foot drop, left foot: Secondary | ICD-10-CM | POA: Diagnosis not present

## 2023-03-11 DIAGNOSIS — R2681 Unsteadiness on feet: Secondary | ICD-10-CM | POA: Diagnosis not present

## 2023-03-11 DIAGNOSIS — R296 Repeated falls: Secondary | ICD-10-CM | POA: Diagnosis not present

## 2023-03-11 NOTE — Therapy (Signed)
OUTPATIENT PHYSICAL THERAPY NEURO TREATMENT NOTE   Patient Name: Johnathan Ross MRN: [id number] DOB:06-04-1970, 52 y.o., male Today's Date: 03/11/2023   PCP: Etta Grandchild, MD REFERRING PROVIDER: Asa Lente, MD    PT End of Session - 03/11/23 0926     Visit Number 7    Number of Visits 8    Date for PT Re-Evaluation 03/29/23    Authorization Type [employer] MEDICARE    Authorization Time Period 01-15-23 - 03-29-23    PT Start Time 0930    PT Stop Time 1017    PT Time Calculation (min) 47 min    Activity Tolerance Patient tolerated treatment well    Behavior During Therapy [facility] for tasks assessed/performed                 Past Medical History:  Diagnosis Date   Hyperlipidemia    Hypertension    Hypogonadism in male 11/01/2016   Hypothyroidism    Multiple sclerosis (HCC)    Neuromuscular disorder (HCC)    Proliferative diabetic retinopathy(362.02)    Type 1 diabetes mellitus (HCC) dx'd 1994   Vitamin D insufficiency 11/29/2016   Past Surgical History:  Procedure Laterality Date   EYE SURGERY Bilateral    "laser for diabetic retinopathy"   FRACTURE SURGERY     IR VENO/EXT/UNI LEFT  03/24/2020   OPEN REDUCTION INTERNAL FIXATION (ORIF) TIBIA/FIBULA FRACTURE Right 10/30/2016   ORIF ANKLE FRACTURE Left 2015   ORIF TIBIA FRACTURE Right 10/30/2016   Procedure: OPEN REDUCTION INTERNAL FIXATION (ORIF) TIBIA FIBULA FRACTURE;  Surgeon: Myrene Galas, MD;  Location: [facility] OR;  Service: Orthopedics;  Laterality: Right;   RETINAL LASER PROCEDURE Bilateral    "for diabetic retinopathy"   Patient Active Problem List   Diagnosis Date Noted   Mass of skin of right lower extremity 08/02/2022   Oropharyngeal dysphagia 08/02/2022   Nasal congestion 08/02/2022   Encounter for general adult medical examination with abnormal findings 08/02/2022   Screening for prostate cancer 08/01/2021   OAB (overactive bladder) 09/21/2020   Essential hypertension 09/21/2020   Urinary  dysfunction 09/21/2020   Thoracic outlet syndrome 05/03/2020   Controlled type 1 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 (HCC) 03/24/2020   Right epiretinal membrane 03/24/2020   Retinal exudates and deposits 03/24/2020   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   Nuclear sclerotic cataract of both eyes 03/24/2020   Gait disorder 03/23/2020   Benign prostatic hyperplasia without lower urinary tract symptoms 03/01/2020   Diabetic gastroparesis associated with type 1 diabetes mellitus (HCC) 03/01/2020   Uncontrolled type 1 diabetes mellitus with hyperglycemia (HCC) 03/01/2020   Gait disturbance 08/19/2019   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/19/2019   Vitamin D insufficiency 11/29/2016   Hypogonadism in male 11/01/2016   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HCC)    Hypothyroidism    Hypertension    Abnormal liver function tests 03/06/2013   HYPERCHOLESTEROLEMIA 11/12/2007   MULTIPLE SCLEROSIS 08/29/2007   PROLIFERATIVE DIABETIC RETINOPATHY 08/29/2007    ONSET DATE: 12/19/2022 (referral date)  REFERRING DIAG:  G35 (ICD-10-CM) - Multiple sclerosis (HCC) 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)  THERAPY DIAG:  Other abnormalities of gait and mobility  Muscle weakness (generalized)  Other symptoms and signs involving the nervous system  Rationale for Evaluation and Treatment Rehabilitation  SUBJECTIVE:  SUBJECTIVE STATEMENT: Pt having more difficulty clearing LLE in swing phase of gait today, compared to performance in previous PT session last week; pt says "every day is different".  Has wheelchair evaluation scheduled on 03-22-23 with [employer].  Pt reports he still hasn't heard from [facility] about AFO (Primary PT, Camille Bal, stated she would call  [facility])   Pt accompanied by: self  PERTINENT HISTORY: vitamin D insufficiency, Type 1 IDDM w/ polyneuropathy, diabetic retinopathy, hypothyroidism, HTN, hypercholestrolemia, h/o Rt tibia fracture, h/o Lt ankle fracture with ORIF, MS with acquired diplegia (MS diagnosis 2014/2015)  PAIN:  Are you having pain? No - shoulder pain has resolved.  He never saw an ortho MD for shoulder.  PRECAUTIONS: Fall  WEIGHT BEARING RESTRICTIONS: No  FALLS: Has patient fallen in last 6 months? Yes. Number of falls 2 at the most, he was in the house trying to pick something up off the floor  LIVING ENVIRONMENT: Lives with:  lives with their partner (girlfriend) Lives in: House/apartment Stairs: Yes; External: 6 steps; on right going up, on left going up, and can reach both Has following equipment at home: Dan Humphreys - 2 wheeled, Wheelchair (manual), and shower chair  PLOF: Independent with basic ADLs, Independent with household mobility with device, Independent with transfers, and Needs assistance with homemaking  PATIENT GOALS: Increased stamina and knee control  OBJECTIVE:   DIAGNOSTIC FINDINGS: MRI of the cervical spine 03/14/2022 shows T2 hyperintense foci posterolaterally to the right adjacent to C2-C3, laterally to the left adjacent to C3-C4 and anterior/central at C4-C5.  These were all present on the MRI from 07/01/2015, there were no new lesions.  Additional note is made of a disc osteophyte complex to the right at C3-C4 that could compress the C4 nerve root and a disc osteophyte complex to the left at the C4-C5 that could affect the left C5 nerve root.  There is spinal stenosis but no nerve root compression at C5-C6.  There is a left disc osteophyte complex at C6-C7 causing foraminal narrowing but no nerve root compression   MRI of the brain 03/14/2022 shows scattered T2/FLAIR hyperintense in the periventricular, juxtacortical and deep white matter of both cerebral hemispheres.  A focus is also noted in  the lateral left pons.  Paired to the MRI dated 08/20/2017, there are no new lesions.   Today's Treatment:  03-11-23 TherAct: Pt performed 4 rounds of varying reach blaze pod taps with 2 pods placed on Kay bench and 4 pods placed on mat in front of kay bench. Pt in tall kneeling position - initially used UE support on Kay bench during round 1: progressed to no/minimal UE support with pt in tall kneeling/ partial mini squats to heels for balance with reaching pods on Kay bench and on mat table.  Pt reported more difficulty without UE support on bench:  number of taps average upper 40's - mid 50's; activity for core stabilization and bil. hamstring isometric contraction   TherEx: In sidelying - pt performed knee flexion/extension with min to mod to max assist for flexion of RLE and LLE, depending on fatigue;  10 reps each LE in sidelying with PT supporting leg.  Scifit level 2.5 x 6" with Ue's and LE's for strengthening and facilitation of hamstring activation for AROM  NeuroRe-ed: -In tall kneeling position - pt held 5# kettlebell in bil. Hands - performed mini squats sitting back toward heels and then lifted kettlebell out in front with elbows extended - 10 reps; performed Rt and Lt  hip flexion/extension and abdct/adduction with PT lifting leg up off mat table for decreased resistance of shoes on mat - 10 reps each for hip musc. strengthening  GAIT : Gait pattern:  pt uses vaulting gait pattern in which he lifts himself up and swings bil.LE's (paraplegic gait pattern) for increased speed; when not in hurry attempts reciprocating gait pattern, decreased step length- Left, decreased hip/knee flexion- Right, decreased hip/knee flexion- Left, decreased ankle dorsiflexion- Left, and poor foot clearance- Left Distance walked: ~120 ft from lobby and 2nd mat table on far side of gym  03-11-23:  pt had difficulty clearing Lt toes/foot in swing when amb. From mat table to SciFit, approx. 20' at end of  session (due to fatigue)  PATIENT EDUCATION: Education details: Process for obtaining [facility] referral.  Goal of interventions of strengthening and tone management today.  Ongoing education about fatigue and post-exercise response.  Discussed where we are in process of him obtaining left AFO - referral just re-faxed 03/05/2023.   Person educated: Patient Education method: Explanation Education comprehension: verbalized understanding  HOME EXERCISE PROGRAM: To be reviewed/modified.  [id number] (code from episode prior)             Access Code: [email or web address] URL: [email or web address] Date: 04/12/2022 Prepared by: Maebelle Munroe   Exercises - Seated Hamstring Curl with Anchored Resistance  - 1 x daily - 7 x weekly - 1 sets - 10 reps - AND SEATED HIP ADDUCTION  - 1 x daily - 7 x weekly - 1 sets - 10 reps - 3-5 hold - Long Sitting Hamstring Stretch  - 1 x daily - 7 x weekly - 1 sets - 1-2 reps - 30-60 secs hold  GOALS: Goals reviewed with patient? Yes  SHORT TERM GOALS: Target date: 02/15/2023  Pt will decrease 5xSTS to </=16.53 seconds in order to demonstrate decreased risk for falls and improved functional bilateral LE strength and power. Baseline:  19.53 secs; 19.91 seconds (9/23) Goal status: NOT MET  2.  Pt will trial AFO options in clinic and compare to need for custom AFO option with order request placed as appropriate. Baseline: Order received, to be faxed to [facility] (9/23) Goal status: MET  3.  Pt will demonstrate TUG of </=83 seconds in order to decrease risk of falls and improve functional mobility using LRAD. Baseline: 103 secs with RW; 248.96 seconds (reciprocal gait -02/18/2023) Goal status: NOT MET  4.  Pt will undergo review of prior HEP for appropriateness to current functional level and to maintain functional progress and improve mobility.  Baseline: Reviewed at session prior (9/23) Goal status: MET  LONG TERM GOALS: Target date: 03/15/2023  Pt will decrease  5xSTS to </=13.53 seconds in order to demonstrate decreased risk for falls and improved functional bilateral LE strength and power. Baseline:  19.53 secs Goal status: INITIAL  2.   Pt will demonstrate floor retrieval of household object using single UE support with return to upright at modI level in order to improve management in home and decrease risk of falls with daily tasks. Baseline: Picking up objects has caused pt 2 recent falls Goal status: INITIAL  3.   Pt will demonstrate TUG of </=63 seconds in order to decrease risk of falls and improve functional mobility using LRAD. Baseline: 103 secs with RW Goal status: INITIAL  4.  Pt will ambulate >/=100 feet with RW and bilateral AFOs continuously at no more than modI level of assist to promote household and community access. Baseline: 74' using paralytic  gait and RW Goal status: INITIAL  5.  Patient will be independent and compliant to updated and modified HEP for maintenance of functional progress. Baseline: To be reviewed/modified. Goal status: INITIAL  ASSESSMENT:  CLINICAL IMPRESSION: PT session focused on core strengthening, LE strengthening and hamstring activation in bil. LE's. Pt had mod. Difficulty maintaining tall kneeling position without UE support but able to maintain balance with intermittent UE support and perform small range mini squats, sitting back towards heels and then returning to tall kneeling position.  Pt able to actively flex Rt knee for 2 reps without assist and then required mod to max assist due to decreased strength and decreased muscle endurance Rt hamstrings.  Pt required mod to max assist for Lt knee flexion also due to Lt hamstring weakness.  Cont with POC.  OBJECTIVE IMPAIRMENTS: Abnormal gait, decreased activity tolerance, decreased balance, decreased coordination, decreased endurance, decreased knowledge of condition, decreased mobility, difficulty walking, decreased ROM, decreased strength, increased  muscle spasms, impaired tone, and improper body mechanics.   ACTIVITY LIMITATIONS: carrying, bending, standing, squatting, stairs, transfers, and locomotion level  PARTICIPATION LIMITATIONS: meal prep, cleaning, laundry, driving, shopping, community activity, and yard work  PERSONAL FACTORS: Age, Past/current experiences, Time since onset of injury/illness/exacerbation, and 1-2 comorbidities: ongoing assessment of MS progression, polyneuropathy  are also affecting patient's functional outcome.   REHAB POTENTIAL: Fair due to chronicity of deficits and progressive nature of MS disease process  CLINICAL DECISION MAKING: Stable/uncomplicated  EVALUATION COMPLEXITY: Low  PLAN:  PT FREQUENCY: 1x/week  PT DURATION: 8 weeks  PLANNED INTERVENTIONS: Therapeutic exercises, Therapeutic activity, Neuromuscular re-education, Balance training, Gait training, Patient/Family education, Self Care, Stair training, Orthotic/Fit training, DME instructions, Electrical stimulation, Taping, Manual therapy, and Re-evaluation  PLAN FOR NEXT SESSION:  Check LTG's next session as next visit is 8/8 authorized;  cont strengthening and stretching - shoulder and core strength, hip flexor/hamstring LLE   Dula Havlik, Donavan Burnet, PT 03/11/2023, 8:57 PM

## 2023-03-15 ENCOUNTER — Other Ambulatory Visit (INDEPENDENT_AMBULATORY_CARE_PROVIDER_SITE_OTHER): Payer: Medicare PPO

## 2023-03-15 DIAGNOSIS — E1065 Type 1 diabetes mellitus with hyperglycemia: Secondary | ICD-10-CM

## 2023-03-15 DIAGNOSIS — E063 Autoimmune thyroiditis: Secondary | ICD-10-CM | POA: Diagnosis not present

## 2023-03-15 LAB — BASIC METABOLIC PANEL
BUN: 14 mg/dL (ref 6–23)
CO2: 30 meq/L (ref 19–32)
Calcium: 9.4 mg/dL (ref 8.4–10.5)
Chloride: 100 meq/L (ref 96–112)
Creatinine, Ser: 1.08 mg/dL (ref 0.40–1.50)
GFR: 79.96 mL/min (ref >60.00)
Glucose, Bld: 191 mg/dL — ABNORMAL HIGH (ref 70–99)
Potassium: 4.2 meq/L (ref 3.5–5.1)
Sodium: 139 meq/L (ref 135–145)

## 2023-03-15 LAB — TSH: TSH: 2.09 u[IU]/mL (ref 0.35–5.50)

## 2023-03-15 LAB — HEMOGLOBIN A1C: Hgb A1c MFr Bld: 7.5 % — ABNORMAL HIGH (ref 4.6–6.5)

## 2023-03-18 ENCOUNTER — Ambulatory Visit: Payer: Medicare PPO | Admitting: Physical Therapy

## 2023-03-18 DIAGNOSIS — R296 Repeated falls: Secondary | ICD-10-CM | POA: Diagnosis not present

## 2023-03-18 DIAGNOSIS — M6281 Muscle weakness (generalized): Secondary | ICD-10-CM | POA: Diagnosis not present

## 2023-03-18 DIAGNOSIS — M21371 Foot drop, right foot: Secondary | ICD-10-CM | POA: Diagnosis not present

## 2023-03-18 DIAGNOSIS — R2681 Unsteadiness on feet: Secondary | ICD-10-CM | POA: Diagnosis not present

## 2023-03-18 DIAGNOSIS — R262 Difficulty in walking, not elsewhere classified: Secondary | ICD-10-CM | POA: Diagnosis not present

## 2023-03-18 DIAGNOSIS — M21372 Foot drop, left foot: Secondary | ICD-10-CM | POA: Diagnosis not present

## 2023-03-18 DIAGNOSIS — R2689 Other abnormalities of gait and mobility: Secondary | ICD-10-CM | POA: Diagnosis not present

## 2023-03-18 DIAGNOSIS — R29818 Other symptoms and signs involving the nervous system: Secondary | ICD-10-CM | POA: Diagnosis not present

## 2023-03-18 NOTE — Therapy (Unsigned)
OUTPATIENT PHYSICAL THERAPY NEURO TREATMENT NOTE   Patient Name: Johnathan Ross MRN: [id number] DOB:24-Jan-1971, 52 y.o., male Today's Date: 03/19/2023   PCP: Etta Grandchild, MD REFERRING PROVIDER: Asa Lente, MD    PT End of Session - 03/19/23 1923     Visit Number 8    Number of Visits 8    Date for PT Re-Evaluation 03/29/23    Authorization Type [employer] MEDICARE    Authorization Time Period 01-15-23 - 03-29-23    PT Start Time 0935    PT Stop Time 1020    PT Time Calculation (min) 45 min    Activity Tolerance Patient tolerated treatment well    Behavior During Therapy [facility] for tasks assessed/performed                  Past Medical History:  Diagnosis Date   Hyperlipidemia    Hypertension    Hypogonadism in male 11/01/2016   Hypothyroidism    Multiple sclerosis (HCC)    Neuromuscular disorder (HCC)    Proliferative diabetic retinopathy(362.02)    Type 1 diabetes mellitus (HCC) dx'd 1994   Vitamin D insufficiency 11/29/2016   Past Surgical History:  Procedure Laterality Date   EYE SURGERY Bilateral    "laser for diabetic retinopathy"   FRACTURE SURGERY     IR VENO/EXT/UNI LEFT  03/24/2020   OPEN REDUCTION INTERNAL FIXATION (ORIF) TIBIA/FIBULA FRACTURE Right 10/30/2016   ORIF ANKLE FRACTURE Left 2015   ORIF TIBIA FRACTURE Right 10/30/2016   Procedure: OPEN REDUCTION INTERNAL FIXATION (ORIF) TIBIA FIBULA FRACTURE;  Surgeon: Myrene Galas, MD;  Location: [facility] OR;  Service: Orthopedics;  Laterality: Right;   RETINAL LASER PROCEDURE Bilateral    "for diabetic retinopathy"   Patient Active Problem List   Diagnosis Date Noted   Mass of skin of right lower extremity 08/02/2022   Oropharyngeal dysphagia 08/02/2022   Nasal congestion 08/02/2022   Encounter for general adult medical examination with abnormal findings 08/02/2022   Screening for prostate cancer 08/01/2021   OAB (overactive bladder) 09/21/2020   Essential hypertension 09/21/2020   Urinary  dysfunction 09/21/2020   Thoracic outlet syndrome 05/03/2020   Controlled type 1 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 (HCC) 03/24/2020   Right epiretinal membrane 03/24/2020   Retinal exudates and deposits 03/24/2020   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   Nuclear sclerotic cataract of both eyes 03/24/2020   Gait disorder 03/23/2020   Benign prostatic hyperplasia without lower urinary tract symptoms 03/01/2020   Diabetic gastroparesis associated with type 1 diabetes mellitus (HCC) 03/01/2020   Uncontrolled type 1 diabetes mellitus with hyperglycemia (HCC) 03/01/2020   Gait disturbance 08/19/2019   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/19/2019   Vitamin D insufficiency 11/29/2016   Hypogonadism in male 11/01/2016   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HCC)    Hypothyroidism    Hypertension    Abnormal liver function tests 03/06/2013   HYPERCHOLESTEROLEMIA 11/12/2007   MULTIPLE SCLEROSIS 08/29/2007   PROLIFERATIVE DIABETIC RETINOPATHY 08/29/2007    ONSET DATE: 12/19/2022 (referral date)  REFERRING DIAG:  G35 (ICD-10-CM) - Multiple sclerosis (HCC) 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)  THERAPY DIAG:  Other abnormalities of gait and mobility  Muscle weakness (generalized)  Unsteadiness on feet  Rationale for Evaluation and Treatment Rehabilitation  SUBJECTIVE:  SUBJECTIVE STATEMENT: Pt reports no changes  - says he still has not heard from [facility] about appt for AFO;  has wheelchair eval scheduled on 03-22-23   Pt accompanied by: girlfriend  PERTINENT HISTORY: vitamin D insufficiency, Type 1 IDDM w/ polyneuropathy, diabetic retinopathy, hypothyroidism, HTN, hypercholestrolemia, h/o Rt tibia fracture, h/o Lt ankle fracture with ORIF, MS with  acquired diplegia (MS diagnosis 2014/2015)  PAIN:  Are you having pain? No - shoulder pain has resolved.  He never saw an ortho MD for shoulder.  PRECAUTIONS: Fall  WEIGHT BEARING RESTRICTIONS: No  FALLS: Has patient fallen in last 6 months? Yes. Number of falls 2 at the most, he was in the house trying to pick something up off the floor  LIVING ENVIRONMENT: Lives with:  lives with their partner (girlfriend) Lives in: House/apartment Stairs: Yes; External: 6 steps; on right going up, on left going up, and can reach both Has following equipment at home: Dan Humphreys - 2 wheeled, Wheelchair (manual), and shower chair  PLOF: Independent with basic ADLs, Independent with household mobility with device, Independent with transfers, and Needs assistance with homemaking  PATIENT GOALS: Increased stamina and knee control  OBJECTIVE:   DIAGNOSTIC FINDINGS: MRI of the cervical spine 03/14/2022 shows T2 hyperintense foci posterolaterally to the right adjacent to C2-C3, laterally to the left adjacent to C3-C4 and anterior/central at C4-C5.  These were all present on the MRI from 07/01/2015, there were no new lesions.  Additional note is made of a disc osteophyte complex to the right at C3-C4 that could compress the C4 nerve root and a disc osteophyte complex to the left at the C4-C5 that could affect the left C5 nerve root.  There is spinal stenosis but no nerve root compression at C5-C6.  There is a left disc osteophyte complex at C6-C7 causing foraminal narrowing but no nerve root compression   MRI of the brain 03/14/2022 shows scattered T2/FLAIR hyperintense in the periventricular, juxtacortical and deep white matter of both cerebral hemispheres.  A focus is also noted in the lateral left pons.  Paired to the MRI dated 08/20/2017, there are no new lesions.   Today's Treatment:  03-18-23  TherEx: 5x sit to stand transfers - 12.50 secs from high/low mat table without UE support (pt did not completely return  to seated position with each rep)  NeuroRe-ed: TUG score -  2:12.97 secs with reciprocal gait pattern with RW:   27.75 secs with swing through gait pattern with RW  Pt picked up object off floor with SBA - first picked up gel bottle, then tissue box placed on its side  Called [facility] for pt - reported they left message for pt on 03-07-23; pt was instructed to call for appt for Lt AFO  GAIT : Gait pattern:  pt uses vaulting gait pattern in which he lifts himself up and swings bil.LE's (paraplegic gait pattern) for increased speed; when not in hurry attempts reciprocating gait pattern, decreased step length- Left, decreased hip/knee flexion- Right, decreased hip/knee flexion- Left, decreased ankle dorsiflexion- Left, and poor foot clearance- Left Distance walked: 13' with RW for LTG assessment  03-11-23:  pt had difficulty clearing Lt toes/foot in swing when amb. From mat table to SciFit, approx. 20' at end of session (due to fatigue)  PATIENT EDUCATION: Education details: Process for obtaining PWC referral.  Goal of interventions of strengthening and tone management today.  Ongoing education about fatigue and post-exercise response.  Discussed where we are in process of him obtaining  left AFO - referral just re-faxed 03/05/2023.   Person educated: Patient Education method: Explanation Education comprehension: verbalized understanding  HOME EXERCISE PROGRAM: To be reviewed/modified.  [id number] (code from episode prior)             Access Code: [email or web address] URL: [email or web address] Date: 04/12/2022 Prepared by: Maebelle Munroe   Exercises - Seated Hamstring Curl with Anchored Resistance  - 1 x daily - 7 x weekly - 1 sets - 10 reps - AND SEATED HIP ADDUCTION  - 1 x daily - 7 x weekly - 1 sets - 10 reps - 3-5 hold - Long Sitting Hamstring Stretch  - 1 x daily - 7 x weekly - 1 sets - 1-2 reps - 30-60 secs hold  GOALS: Goals reviewed with patient? Yes  SHORT TERM GOALS: Target  date: 02/15/2023  Pt will decrease 5xSTS to </=16.53 seconds in order to demonstrate decreased risk for falls and improved functional bilateral LE strength and power. Baseline:  19.53 secs; 19.91 seconds (9/23) Goal status: NOT MET  2.  Pt will trial AFO options in clinic and compare to need for custom AFO option with order request placed as appropriate. Baseline: Order received, to be faxed to [facility] (9/23) Goal status: MET  3.  Pt will demonstrate TUG of </=83 seconds in order to decrease risk of falls and improve functional mobility using LRAD. Baseline: 103 secs with RW; 248.96 seconds (reciprocal gait -02/18/2023) Goal status: NOT MET  4.  Pt will undergo review of prior HEP for appropriateness to current functional level and to maintain functional progress and improve mobility.  Baseline: Reviewed at session prior (9/23) Goal status: MET  LONG TERM GOALS: Target date: 03/15/2023  Pt will decrease 5xSTS to </=13.53 seconds in order to demonstrate decreased risk for falls and improved functional bilateral LE strength and power. Baseline:  19.53 secs;   03-18-23 = 12.50 secs from mat Goal status: Goal met 03-18-23  2.   Pt will demonstrate floor retrieval of household object using single UE support with return to upright at modI level in order to improve management in home and decrease risk of falls with daily tasks. Baseline: Picking up objects has caused pt 2 recent falls Goal status: Goal met 03-18-23  3.   Pt will demonstrate TUG of </=63 seconds in order to decrease risk of falls and improve functional mobility using LRAD. Baseline: 103 secs with RW;   2:12.97 secs with reciprocal gait pattern,  27.75 secs with swing through gait pattern  Goal status: Partially met - with swing through gait pattern met; not met with reciprocal gait pattern  4.  Pt will ambulate >/=100 feet with RW and bilateral AFOs continuously at no more than modI level of assist to promote household and  community access. Baseline: 56' using paralytic gait and RW;  115' with RW on 03-18-23 Goal status: Partially met 03-18-23  5.  Patient will be independent and compliant to updated and modified HEP for maintenance of functional progress. Baseline: To be reviewed/modified. Goal status: Goal met 03-18-23  ASSESSMENT:  CLINICAL IMPRESSION:  Pt has met LTG's #1, 2 and 5; LTG's #3 and 4 partially met; pt has not had appt. At [facility] for Lt AFO as of today's date but has been instructed to call for appt.  Pt is placed on hold until Lt AFO has been obtained. Pt has received 8/8 authorized visits.  Pt will be re-evaluated after AFO obtained.   OBJECTIVE IMPAIRMENTS: Abnormal gait, decreased activity tolerance,  decreased balance, decreased coordination, decreased endurance, decreased knowledge of condition, decreased mobility, difficulty walking, decreased ROM, decreased strength, increased muscle spasms, impaired tone, and improper body mechanics.   ACTIVITY LIMITATIONS: carrying, bending, standing, squatting, stairs, transfers, and locomotion level  PARTICIPATION LIMITATIONS: meal prep, cleaning, laundry, driving, shopping, community activity, and yard work  PERSONAL FACTORS: Age, Past/current experiences, Time since onset of injury/illness/exacerbation, and 1-2 comorbidities: ongoing assessment of MS progression, polyneuropathy  are also affecting patient's functional outcome.   REHAB POTENTIAL: Fair due to chronicity of deficits and progressive nature of MS disease process  CLINICAL DECISION MAKING: Stable/uncomplicated  EVALUATION COMPLEXITY: Low  PLAN:  PT FREQUENCY: 1x/week  PT DURATION: 8 weeks  PLANNED INTERVENTIONS: Therapeutic exercises, Therapeutic activity, Neuromuscular re-education, Balance training, Gait training, Patient/Family education, Self Care, Stair training, Orthotic/Fit training, DME instructions, Electrical stimulation, Taping, Manual therapy, and  Re-evaluation  PLAN FOR NEXT SESSION:  pt is on hold until Lt AFO obtained   Roseana Rhine, Donavan Burnet, PT 03/19/2023, 7:25 PM

## 2023-03-19 ENCOUNTER — Encounter: Payer: Self-pay | Admitting: Endocrinology

## 2023-03-19 ENCOUNTER — Encounter: Payer: Self-pay | Admitting: Physical Therapy

## 2023-03-19 ENCOUNTER — Ambulatory Visit: Payer: Medicare PPO | Admitting: Endocrinology

## 2023-03-19 VITALS — BP 136/70 | HR 92 | Resp 20 | Ht 73.0 in | Wt 197.2 lb

## 2023-03-19 DIAGNOSIS — E063 Autoimmune thyroiditis: Secondary | ICD-10-CM

## 2023-03-19 DIAGNOSIS — E1065 Type 1 diabetes mellitus with hyperglycemia: Secondary | ICD-10-CM | POA: Diagnosis not present

## 2023-03-19 NOTE — Progress Notes (Unsigned)
Outpatient Endocrinology Note [place] Hades Mathew, MD  03/20/23  Patient's Name: Johnathan Ross    DOB: [birth year]    MRN: [id number]                                                    REASON OF VISIT: Follow up for type 1 diabetes mellitus  PCP: Etta Grandchild, MD  HISTORY OF PRESENT ILLNESS:   FRAZIER METZKER is a 52 y.o. old male with past medical history listed below, is here for follow up of type 1 diabetes mellitus.  Patient was last time seen by Dr. Lucianne Muss in June 2024.  Pertinent Diabetes History: _Diagnosed as diabetes melitis type 1 in 1995.  He has been on insulin pump therapy since 2012.  On Medtronic 780G since September 2023.  Chronic Diabetes Complications : Retinopathy: yes. Last ophthalmology exam was done on every 6 months  Nephropathy: no, on losartan Peripheral neuropathy: yes, from MS, on gabapentin Coronary artery disease: no Stroke: no  Relevant comorbidities and cardiovascular risk factors: Obesity: no Body mass index is 26.02 kg/m.  Hypertension: yes Hyperlipidemia. Yes, on statin.   Current / Home Diabetic regimen includes:  Medtronic 780G using NovoLog U100  Insulin Pump setting:  Basal MN- 0.20u/hour 4AM- 1.50  6AM- 1.50 9AM- 1.5 10PM-  0.150  Bolus CHO Ratio (1unit:CHO) MN- 1:15 8AM- 1:8 12PM- 1:9.3 6PM- 1:9.3 10PM-  1:20  Correction/Sensitivity: MN- 1:35  Target: 130  Active insulin time: 2 hours  Prior diabetic medications:   CONTINUOUS GLUCOSE MONITORING SYSTEM (CGMS) / INSULIN PUMP INTERPRETATION:                     Medtronic Pump & Sensor Download (Reviewed and summarized below.) Medtronic 780G   Trends:  Mostly acceptable blood sugar, related with mild hyperglycemia with meals and appropriately trending down, had hypoglycemia with blood sugar in 60s on October 21 related with meal bolus.  He ate significant amount of meals with average carb of 300 g/day.  No significant hypoglycemia.  There has been frequent suspicion  of basal rates in the afternoon.  GMI 6.9%.  Hypoglycemia: Patient has rare/ minor hypoglycemic episodes. Patient has hypoglycemia awareness.    Factors modifying glucose control: 1.  Diabetic diet assessment: 3 meals a day.  2.  Staying active or exercising: Not able to exercise.  3.  Medication compliance: compliant all of the time.  # Primary hypothyroidism  -Longstanding history of primary 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 autoimmune.  Currently taking levothyroxine 125 mcg 6 days a week.  # He has multiple sclerosis, following with neurology.  Interval history 03/19/2023 Pump and CGM data as reviewed above.  Recent thyroid lab normal.  No hypo and hyperthyroid symptoms.  No other complaints today.  REVIEW OF SYSTEMS As per history of present illness.   PAST MEDICAL HISTORY: Past Medical History:  Diagnosis Date   Hyperlipidemia    Hypertension    Hypogonadism in male 11/01/2016   Hypothyroidism    Multiple sclerosis (HCC)    Neuromuscular disorder (HCC)    Proliferative diabetic retinopathy(362.02)    Type 1 diabetes mellitus (HCC) dx'd 1994   Vitamin D insufficiency 11/29/2016    PAST SURGICAL HISTORY: Past Surgical History:  Procedure Laterality Date   EYE SURGERY Bilateral    "laser for diabetic retinopathy"   FRACTURE  SURGERY     IR VENO/EXT/UNI LEFT  03/24/2020   OPEN REDUCTION INTERNAL FIXATION (ORIF) TIBIA/FIBULA FRACTURE Right 10/30/2016   ORIF ANKLE FRACTURE Left 2015   ORIF TIBIA FRACTURE Right 10/30/2016   Procedure: OPEN REDUCTION INTERNAL FIXATION (ORIF) TIBIA FIBULA FRACTURE;  Surgeon: Myrene Galas, MD;  Location: [facility] OR;  Service: Orthopedics;  Laterality: Right;   RETINAL LASER PROCEDURE Bilateral    "for diabetic retinopathy"    ALLERGIES: Allergies  Allergen Reactions   Lisinopril Cough   Ivp Dye [Iodinated Contrast Media] Hives and Itching    FAMILY HISTORY:  Family History  Problem Relation Age of Onset   Diabetes Sister    Colon polyps  Maternal Uncle    Colon cancer Maternal Grandmother    Stomach cancer Cousin    Esophageal cancer Neg Hx     SOCIAL HISTORY: Social History   Socioeconomic History   Marital status: Single    Spouse name: 12   Number of children: 1   Years of education: Not on file   Highest education level: Not on file  Occupational History   Occupation: unemployed  Tobacco Use   Smoking status: Never   Smokeless tobacco: Never  Vaping Use   Vaping status: Never Used  Substance and Sexual Activity   Alcohol use: No   Drug use: No   Sexual activity: Not on file  Other Topics Concern   Not on file  Social History Narrative   Right handed   No caffeine    Lives with girlfriend   Social Determinants of Health   Financial Resource Strain: Low Risk  (09/10/2022)   Overall Financial Resource Strain (CARDIA)    Difficulty of Paying Living Expenses: Not hard at all  Food Insecurity: No Food Insecurity (09/10/2022)   Hunger Vital Sign    Worried About Running Out of Food in the Last Year: Never true    Ran Out of Food in the Last Year: Never true  Transportation Needs: No Transportation Needs (09/10/2022)   PRAPARE - Administrator, Civil Service (Medical): No    Lack of Transportation (Non-Medical): No  Physical Activity: Sufficiently Active (09/10/2022)   Exercise Vital Sign    Days of Exercise per Week: 5 days    Minutes of Exercise per Session: 60 min  Stress: No Stress Concern Present (09/10/2022)   [employer] of Occupational Health - Occupational Stress Questionnaire    Feeling of Stress : Not at all  Social Connections: Unknown (10/06/2021)   [facility] from [facility]   Social Network    Social Network: Not on file  Recent Concern: Social Connections - Moderately Isolated (09/07/2021)   Social Connection and Isolation Panel [NHANES]    Frequency of Communication with Friends and Family: More than three times a week    Frequency of Social Gatherings  with Friends and Family: More than three times a week    Attends Religious Services: Never    Database administrator or Organizations: No    Attends Engineer, structural: Never    Marital Status: Living with partner    MEDICATIONS:  Current Outpatient Medications  Medication Sig Dispense Refill   baclofen (LIORESAL) 10 MG tablet TAKE 1 TABLET BY MOUTH THREE TIMES A DAY AS NEEDED FOR MUSCLE SPASMS 270 tablet 0   dalfampridine 10 MG TB12 Take 1 tablet (10 mg total) by mouth 2 (two) times daily. 180 tablet 1   gabapentin (NEURONTIN) 100 MG capsule  Take 1 capsule (100 mg total) by mouth 3 (three) times daily. 90 capsule 5   Glucagon (GVOKE HYPOPEN 2-PACK) 1 MG/0.2ML SOAJ Inject 1 Act into the skin daily as needed. 2 mL 5   glucose blood (CONTOUR NEXT TEST) test strip USE 2 TIMES PER DAY. E10.65 200 strip 3   levocetirizine (XYZAL) 5 MG tablet TAKE 1 TABLET BY MOUTH EVERY DAY IN THE EVENING 90 tablet 1   losartan (COZAAR) 50 MG tablet TAKE 1 TABLET BY MOUTH EVERY DAY 90 tablet 3   NOVOLOG 100 UNIT/ML injection USE UP TO 80 UNITS IN INSULIN PUMP DAILY-DX CODE E10.8 70 mL 2   OVER THE COUNTER MEDICATION Sea moss supplement-vitamin and minerals daily     silodosin (RAPAFLO) 8 MG CAPS capsule Take 1 capsule (8 mg total) by mouth daily with breakfast. 30 capsule 11   simvastatin (ZOCOR) 20 MG tablet TAKE 1 TABLET BY MOUTH EVERYDAY AT BEDTIME 90 tablet 0   solifenacin (VESICARE) 5 MG tablet TAKE 1 TABLET (5 MG TOTAL) BY MOUTH DAILY. 90 tablet 0   SYNTHROID 125 MCG tablet TAKE 1 TABLET BY MOUTH 6 DAYS PER WEEK 72 tablet 1   Continuous Blood Gluc Receiver (DEXCOM G5 RECEIVER KIT) DEVI by Does not apply route. (Patient not taking: Reported on 03/19/2023)     Continuous Blood Gluc Sensor (DEXCOM G6 SENSOR) MISC Use as directed. (Patient not taking: Reported on 03/19/2023) 12 each 1   Continuous Blood Gluc Transmit (DEXCOM G6 TRANSMITTER) MISC Use to monitor blood sugar (Patient not taking:  Reported on 03/19/2023) 1 each 0   No current facility-administered medications for this visit.    PHYSICAL EXAM: Vitals:   03/19/23 0957  BP: 136/70  Pulse: 92  Resp: 20  SpO2: 92%  Weight: 197 lb 3.2 oz (89.4 kg)  Height: 6\' 1"  (1.854 m)   Body mass index is 26.02 kg/m.  Wt Readings from Last 3 Encounters:  03/19/23 197 lb 3.2 oz (89.4 kg)  12/19/22 197 lb 8 oz (89.6 kg)  09/10/22 198 lb (89.8 kg)    General: Well developed, well nourished male in no apparent distress.  HEENT: AT/[place], no external lesions.  Eyes: Conjunctiva clear and no icterus. Neck: Neck supple  Lungs: Respirations not labored Neurologic: Alert, oriented, normal speech Extremities / Skin: Dry.  Psychiatric: Does not appear depressed or anxious  Diabetic Foot Exam - Simple   No data filed    LABS Reviewed Lab Results  Component Value Date   HGBA1C 7.5 (H) 03/15/2023   HGBA1C 7.4 (H) 10/26/2022   HGBA1C 7.4 (H) 06/21/2022   Lab Results  Component Value Date   FRUCTOSAMINE 383 (H) 12/03/2019   FRUCTOSAMINE 355 (H) 08/27/2018   FRUCTOSAMINE 357 (H) 12/23/2017   Lab Results  Component Value Date   CHOL 142 06/21/2022   HDL 50.90 06/21/2022   LDLCALC 82 06/21/2022   LDLDIRECT 65.0 02/09/2021   TRIG 44.0 06/21/2022   CHOLHDL 3 06/21/2022   Lab Results  Component Value Date   MICRALBCREAT 0.5 08/28/2021   MICRALBCREAT 0.7 03/01/2020   Lab Results  Component Value Date   CREATININE 1.08 03/15/2023   Lab Results  Component Value Date   GFR 79.96 03/15/2023    ASSESSMENT / PLAN  1. Uncontrolled type 1 diabetes mellitus with hyperglycemia (HCC)   2. Acquired autoimmune hypothyroidism     Diabetes Mellitus type 1, complicated by diabetic retinopathy, possibly neuropathy. - Diabetic status / severity: Uncontrolled.  Lab Results  Component Value Date   HGBA1C 7.5 (H) 03/15/2023    - Hemoglobin A1c goal <7%   GMI on CGM is 6.9% is better than A1c. ?  Concern about false  elevation of hemoglobin A1c.  I would like to check fructosamine in the follow-up visit.  - Medications:  Insulin pump setting changed as follows: Change basal rates to match with his requirement.  Change the carb ratio for lunch time as follows. Medtronic 780G using NovoLog U100  Insulin Pump setting:  Basal MN- 0.20u/hour 4AM- 1.50 , changed to 1.3 6AM- 1.50, changed to 1.4 9AM- 1.5, changed to 1.4 10PM-  0.150  Bolus CHO Ratio (1unit:CHO) MN- 1:15 8AM- 1:8, changed time to 7 AM, 1:8 12PM- 1:9.3, changed to 1:9.5 6PM- 1:9.3 10PM-  1:20  Correction/Sensitivity: MN- 1:35  Target: 130  Active insulin time: 2 hours  - Home glucose testing: continue CGM and check blood glucose as needed.  - Discussed/ Gave Hypoglycemia treatment plan.  # Consult : not required at this time.   # Annual urine for microalbuminuria/ creatinine ratio, no microalbuminuria currently, continue ACE/ARB /losartan.  Will check in next visit. Last  Lab Results  Component Value Date   MICRALBCREAT 0.5 08/28/2021    # Foot check nightly / neuropathy, continue gabapentin.  Managed by neurology/PCP.  # He has proliferative diabetic retinopathy, regularly following with ophthalmology..   - Diet: Make healthy diabetic food choices.  Discussed about reducing carb content in the meals.  2. Blood pressure  -  BP Readings from Last 1 Encounters:  03/19/23 136/70    - Control is in target.  - No change in current plans.  3. Lipid status / Hyperlipidemia - Last  Lab Results  Component Value Date   LDLCALC 82 06/21/2022   - Continue simvastatin 20 mg daily. -Check lipid panel in next visit.  # Primary hypothyroidism -Continue Synthroid 125 mg 6 days a week.  Diagnoses and all orders for this visit:  Uncontrolled type 1 diabetes mellitus with hyperglycemia (HCC) -     Lipid panel; Future -     Microalbumin / creatinine urine ratio; Future -     Hemoglobin A1c; Future -     Basic metabolic  panel; Future -     Fructosamine; Future  Acquired autoimmune hypothyroidism    DISPOSITION Follow up in clinic in 3 months suggested.   All questions answered and patient verbalized understanding of the plan.  [place] Leovanni Bjorkman, MD [facility] Endocrinology [facility]: [facility] [address] [place], [address] [place], [place] [address] Phone # [phone]  At least part of this note was generated using voice recognition software. Inadvertent word errors may have occurred, which were not recognized during the proofreading process.

## 2023-03-20 ENCOUNTER — Encounter: Payer: Self-pay | Admitting: Endocrinology

## 2023-03-22 ENCOUNTER — Ambulatory Visit: Payer: Medicare PPO | Admitting: Physical Therapy

## 2023-03-22 DIAGNOSIS — R262 Difficulty in walking, not elsewhere classified: Secondary | ICD-10-CM | POA: Diagnosis not present

## 2023-03-22 DIAGNOSIS — R29818 Other symptoms and signs involving the nervous system: Secondary | ICD-10-CM | POA: Diagnosis not present

## 2023-03-22 DIAGNOSIS — M21372 Foot drop, left foot: Secondary | ICD-10-CM | POA: Diagnosis not present

## 2023-03-22 DIAGNOSIS — R296 Repeated falls: Secondary | ICD-10-CM | POA: Diagnosis not present

## 2023-03-22 DIAGNOSIS — R2681 Unsteadiness on feet: Secondary | ICD-10-CM | POA: Diagnosis not present

## 2023-03-22 DIAGNOSIS — M6281 Muscle weakness (generalized): Secondary | ICD-10-CM | POA: Diagnosis not present

## 2023-03-22 DIAGNOSIS — R2689 Other abnormalities of gait and mobility: Secondary | ICD-10-CM | POA: Diagnosis not present

## 2023-03-22 DIAGNOSIS — M21371 Foot drop, right foot: Secondary | ICD-10-CM | POA: Diagnosis not present

## 2023-03-22 NOTE — Therapy (Signed)
OUTPATIENT PHYSICAL THERAPY WHEELCHAIR EVALUATION   Patient Name: Johnathan Ross MRN: [id number] DOB:[birth year], 52 y.o., male Today's Date: 03/22/2023  END OF SESSION:  PT End of Session - 03/22/23 0939     Visit Number 1    Number of Visits 1    Date for PT Re-Evaluation 03/22/23    Authorization Type [employer] MEDICARE    PT Start Time [phone]    PT Stop Time 1035    PT Time Calculation (min) 57 min    Activity Tolerance Patient tolerated treatment well    Behavior During Therapy [facility] for tasks assessed/performed             Past Medical History:  Diagnosis Date   Hyperlipidemia    Hypertension    Hypogonadism in male 11/01/2016   Hypothyroidism    Multiple sclerosis (HCC)    Neuromuscular disorder (HCC)    Proliferative diabetic retinopathy(362.02)    Type 1 diabetes mellitus (HCC) dx'd 1994   Vitamin D insufficiency 11/29/2016   Past Surgical History:  Procedure Laterality Date   EYE SURGERY Bilateral    "laser for diabetic retinopathy"   FRACTURE SURGERY     IR VENO/EXT/UNI LEFT  03/24/2020   OPEN REDUCTION INTERNAL FIXATION (ORIF) TIBIA/FIBULA FRACTURE Right 10/30/2016   ORIF ANKLE FRACTURE Left 2015   ORIF TIBIA FRACTURE Right 10/30/2016   Procedure: OPEN REDUCTION INTERNAL FIXATION (ORIF) TIBIA FIBULA FRACTURE;  Surgeon: Myrene Galas, MD;  Location: [facility] OR;  Service: Orthopedics;  Laterality: Right;   RETINAL LASER PROCEDURE Bilateral    "for diabetic retinopathy"   Patient Active Problem List   Diagnosis Date Noted   Mass of skin of right lower extremity 08/02/2022   Oropharyngeal dysphagia 08/02/2022   Nasal congestion 08/02/2022   Encounter for general adult medical examination with abnormal findings 08/02/2022   Screening for prostate cancer 08/01/2021   OAB (overactive bladder) 09/21/2020   Essential hypertension 09/21/2020   Urinary dysfunction 09/21/2020   Thoracic outlet syndrome 05/03/2020   Controlled type 1 diabetes mellitus with stable  proliferative retinopathy of both eyes (HCC) 03/24/2020   Right epiretinal membrane 03/24/2020   Retinal exudates and deposits 03/24/2020   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   Nuclear sclerotic cataract of both eyes 03/24/2020   Gait disorder 03/23/2020   Benign prostatic hyperplasia without lower urinary tract symptoms 03/01/2020   Diabetic gastroparesis associated with type 1 diabetes mellitus (HCC) 03/01/2020   Uncontrolled type 1 diabetes mellitus with hyperglycemia (HCC) 03/01/2020   Gait disturbance 08/19/2019   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/19/2019   Vitamin D insufficiency 11/29/2016   Hypogonadism in male 11/01/2016   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HCC)    Hypothyroidism    Hypertension    Abnormal liver function tests 03/06/2013   HYPERCHOLESTEROLEMIA 11/12/2007   MULTIPLE SCLEROSIS 08/29/2007   PROLIFERATIVE DIABETIC RETINOPATHY 08/29/2007    PCP: Etta Grandchild, MD  REFERRING PROVIDER: Asa Lente, MD  THERAPY DIAG:  Other abnormalities of gait and mobility  Muscle weakness (generalized)  Unsteadiness on feet  Other symptoms and signs involving the nervous system  Repeated falls  Difficulty in walking, not elsewhere classified  Rationale for Evaluation and Treatment Habilitation  SUBJECTIVE:  SUBJECTIVE STATEMENT: Pt presents for wheelchair evaluation. Pt reports 1-2 falls in the last 6 months, no near falls. Pt reports he was diagnosed with RRMS in 2007, has been using a RW for mobility but due to LE weakness he is "swinging" his legs through with RW, putting a lot of strain on his shoulders. Pt interested in obtaining wheeled mobility for more efficient and pain-free mobility.  PRECAUTIONS: Fall  WEIGHT BEARING RESTRICTIONS No    OCCUPATION: disability  PLOF:  Independent with household mobility with device, Independent with gait, Independent with transfers, and Requires assistive device for independence  PATIENT GOALS: to obtain wheeled mobility for safer and more independent functional mobility        MEDICAL HISTORY:  Primary diagnosis onset: 08/29/2007     Medical Diagnosis with ICD-10 code: multiple sclerosis G35   [] Progressive disease  Relevant future surgeries: N/A    Height: 6'1" Weight: 197 lbs Explain recent changes or trends in weight:  N/A    History:  Past Medical History:  Diagnosis Date   Hyperlipidemia    Hypertension    Hypogonadism in male 11/01/2016   Hypothyroidism    Multiple sclerosis (HCC)    Neuromuscular disorder (HCC)    Proliferative diabetic retinopathy(362.02)    Type 1 diabetes mellitus (HCC) dx'd 1994   Vitamin D insufficiency 11/29/2016       Cardio Status:  Functional Limitations:   [x] Intact  []  Impaired      Respiratory Status:  Functional Limitations:   [x] Intact  [] Impaired   [] SOB [] COPD [] O2 Dependent ______LPM  [] Ventilator Dependent  Resp equip:                                                     Objective Measure(s):   Orthotics: AFO RLE, working on getting one for LLE  [] Amputee:                                                             [] Prosthesis:        HOME ENVIRONMENT:  [x] House [] Condo/town home [] Apartment [] Asst living [] LTCF         [] Own  [x] Rent   [] Lives alone [x] Lives with others -     girlfriend                        Hours without assistance: 10 hours per day  [x] Home is accessible to patient     (single story, a few steps to enter the home but can have a ramp installed)                           Storage of wheelchair:  [x] In home   [] Other Comments:        COMMUNITY :  TRANSPORTATION:  [x] Car [] Museum/gallery conservator [] Adapted w/c Lift []  Ambulance [] Other:                     [] Sits in wheelchair during transport    Where is w/c stored during transport? Trunk of car [] Jean Rosenthal  []  [employer]  r   [] Self-Driver       Drive while in  Wheelchair [] yes [x] no   Employment and/or school: N/A Specific requirements pertaining to mobility        Other:  COMMUNICATION:  Verbal Communication  [x] WFL [] receptive [] WFL [] expressive [] Understandable  [] Difficult to understand  [] non-communicative  Primary Language:___English_________ 2nd:_____________  Communication provided by:[x] Patient [] Family [] Caregiver [] Translator   [] Uses an augmentative communication device     Manufacturer/Model :                                                                MOBILITY/BALANCE:  Sitting Balance  Standing Balance  Transfers  Ambulation   [x] WFL      [] WFL  [x] Independent  []  Independent   [] Uses UE for balance in sitting Comments:  [x] Uses UE/device for stability Comments: needs UE support for standing balance (RW, countertop, etc) []  Min assist  []  Ambulates independently with       device:________________      []  Mod assist  []  Able to ambulate _____ feet        safely/functionally/independently   []  Min assist  []  Min assist  []  Max assist  [x]  Non-functional ambulator         History/High risk of falls   []  Mod assist  []  Mod assist  []  Dependent  []  Unable to ambulate   []  Max  assist  []  Max assist  Transfer method:[] 1 person [] 2 person [] sliding board [] squat pivot [] stand pivot [] mechanical patient lift  [] other:   []  Unable  []  Unable    Fall History: # of falls in the past 6 months? 2 falls # of "near" falls in the past 6 months? 0    CURRENT SEATING / MOBILITY:  Current Mobility Device: [] None [x] Cane/Walker [] Manual [] Dependent [] Dependent w/ Tilt rScooter  [] Power (type of control):   Manufacturer:  Model:  Serial #:   Size:  Color:  Age:   Purchased by whom: patient  Current condition of mobility base:  in good condition  Current seating system:                N/A                                                        Age of seating system:  N/A  Describe posture in present seating system: N/A   Is the current mobility meeting medical necessity?:  [] Yes [x] No Describe: Patient is currently using a RW for all of his mobility in the home and the community. Due to BLE weakness from his MS diagnosis he is currently "swinging through" his BLE during gait with his RW, putting significant strain on his shoulder joints. He would be safer and more independent with wheeled mobility to reduce strain on his shoulder joints.                                    Ability to complete Mobility-Related Activities of Daily Living (MRADL's) with Current Mobility Device:  Move room to room  [x] Independent  [] Min [] Mod [] Max assist  [] Unable  Comments:   Meal prep  [x] Independent  [] Min [] Mod [] Max assist  [] Unable    Feeding  [x] Independent  [] Min [] Mod [] Max assist  [] Unable    Bathing  [x] Independent  [] Min [] Mod [] Max assist  [] Unable    Grooming  [x] Independent  [] Min [] Mod [] Max assist  [] Unable    UE dressing  [x] Independent  [] Min [] Mod [] Max assist  [] Unable    LE dressing  [x] Independent   [] Min [] Mod [] Max assist  [] Unable    Toileting  [x] Independent  [] Min [] Mod [] Max assist  [] Unable    Bowel Mgt: [x]  Continent []  Incontinent []  Accidents []  Diapers []  Colostomy []  Bowel Program:  Bladder Mgt: [x]  Continent []  Incontinent []  Accidents []  Diapers []  Urinal []  Intermittent Cath []  Indwelling Cath []  Supra-pubic Cath     Current Mobility Equipment Trialed/ Ruled Out:    Does not meet mobility needs due to:    Mark all boxes that indicate inability to use the specific equipment listed     Meets needs for safe  independent functional  ambulation  / mobility    Risk of  Falling or History of Falls    Enviromental limitations      Cognition    Safety concerns with  physical ability    Decreased / limitations endurance  & strength     Decreased / limitations  motor skills  & coordination     Pain    Pace /  Speed    Cardiac and/or  respiratory condition    Contra - indicated by diagnosis   Cane/Crutches  []   [x]   []   []   [x]   [x]   []   [x]   [x]   []   []    Walker / Rollator  []  NA   []   [x]   []   []   [x]   [x]   []   [x]   [x]   []   []     Manual Wheelchair [id number]:  []  NA  []   []   []   []   [x]   [x]   []   [x]   [x]   []   []    Manual W/C (K0005) with power assist  []  NA  [x]   []   []   []   []   []   []   []   []   []   []    Scooter  [x]  NA  []   []   []   []   []   []   []   []   []   []   []    Power Wheelchair: standard joystick  [x]  NA  []   []   []   []   []   []   []   []   []   []   []    Power Wheelchair: alternative controls  [x]  NA  []   []   []   []   []   []   []   []   []   []   []    Summary:  The least costly alternative for independent functional mobility was found to be:    []  Crutch/Cane  []  Walker [x]  Manual w/c  []  Manual w/c with power assist   []  Scooter   []  Power w/c std joystick   []  Power w/c alternative control        []  Requires dependent care mobility device   Cabin crew for [employer] skills are adequate for safe mobility equipment operation  [x]   Yes []   No  Patient is willing and motivated to use recommended mobility equipment  [x]   Yes []   No       []   Patient is unable to safely operate mobility equipment independently and requires dependent care equipment Comments:           SENSATION and SKIN ISSUES:  Sensation [x]  Intact  []  Impaired []  Absent []  Hyposensate []  Hypersensate  []  Defensiveness  Location(s) of impairment:    Pressure Relief Method(s):  []  Lean side to side to offload (without risk of falling)  [x]   W/C push up (4+ times/hour for 15+ seconds) [x]  Stand up (without risk of falling)    []  Other: (Describe): Effective pressure relief method(s) above can be performed consistently throughout the day: [x] Yes  []  No If not, Why?:  Skin Integrity Risk:       [x]  Low risk           []  Moderate risk            []  High risk  If high  risk, explain:   Skin Issues/Skin Integrity  Current skin Issues  []  Yes [x]  No []  Intact  []   Red area   []   Open area  []  Scar tissue  []  At risk from prolonged sitting  Where: History of Skin Issues  []  Yes [x]  No Where : When: Stage: Hx of skin flap surgeries  []  Yes [x]  No Where:  When:  Pain: [x]  Yes []  No   Pain Location(s): R shoulder Intensity scale: (0-10) : 2 How does pain interfere with mobility and/or MRADLs? - pain caused by walking with RW (presses up on RW to swing through his LE), pain limits his ability to walk longer distances        MAT EVALUATION:  Neuro-Muscular Status: (Tone, Reflexive, Responses, etc.)     []   Intact   [x]  Spasticity: in BLE, taking Baclofen []  Hypotonicity  []  Fluctuating  []  Muscle Spasms  []  Poor Righting Reactions/Poor Equilibrium Reactions  []  Primal Reflex(s):    Comments:            COMMENTS:    POSTURE:     Comments:  Pelvis Anterior/Posterior:  []  Neutral   [x]  Posterior  []  Anterior  []  Fixed - No movement [x]  Tendency away from neutral []  Flexible []  Self-correction []  External correction Obliquity (viewed from front)  [x]  WFL []  R Obliquity []  L Obliquity  []  Fixed - No movement []  Tendency away from neutral []  Flexible []  Self-correction []  External correction Rotation  [x]  WFL []  R anterior []  L anterior  []  Fixed - No movement []  Tendency away from neutral []  Flexible []  Self-correction []  External correction Tonal Influence Pelvis:  [x]  Normal []  Flaccid []  Low tone []  Spasticity []  Dystonia []  Pelvis thrust []  Other:    Trunk Anterior/Posterior:  []  WFL [x]  Thoracic kyphosis []  Lumbar lordosis  []  Fixed - No movement [x]  Tendency away from neutral []  Flexible []  Self-correction []  External correction  [x]  WFL []  Convex to left  []  Convex to right []  S-curve   []  C-curve []  Multiple curves []  Tendency away from neutral []  Flexible []  Self-correction []  External  correction Rotation of shoulders and upper trunk:  []  Neutral []  Left-anterior [x]  Right- anterior []  Fixed- no movement []  Tendency away from neutral []  Flexible []  Self correction []  External correction Tonal influence Trunk:  [x]  Normal []  Flaccid []  Low tone []  Spasticity []  Dystonia []  Other:   Head & Neck  [x]  Functional []  Flexed    []  Extended []  Rotated right  []  Rotated left []  Laterally flexed right []  Laterally flexed  left []  Cervical hyperextension   [x]  Good head control []  Adequate head control []  Limited head control []  Absent head control Describe tone/movement of head and neck: [facility]     Lower Extremity Measurements: LE ROM:  Active ROM Right 03/22/2023 Left 03/22/2023  Hip flexion Limited due to spasticity and weakness Limited due to spasticity and weakness  Hip extension    Hip abduction    Hip adduction    Knee flexion    Knee extension    Ankle dorsiflexion    Ankle plantarflexion     (Blank rows = not tested)  LE MMT:  MMT Right 03/22/2023 Left 03/22/2023  Hip flexion 3- 3-  Hip extension    Hip abduction    Hip adduction    Knee flexion 2 2  Knee extension 3 3  Ankle dorsiflexion 0 2  Ankle plantarflexion     (Blank rows = not tested)  Hip positions:  []  Neutral   [x]  Abducted   []  Adducted  []  Subluxed   []  Dislocated   []  Fixed   []  Tendency away from neutral [x]  Flexible [x]  Self-correction []  External correction   Hip Windswept:[x]  Neutral  []  Right    []  Left  []  Subluxed   []  Dislocated   []  Fixed   []  Tendency away from neutral []  Flexible []  Self-correction []  External correction  LE Tone: []  Normal []  Low tone [x]  Spasticity []  Flaccid []  Dystonia []  Rocks/Extends at hip []  Thrust into knee extension []  Pushes legs downward into footrest  Foot positioning: ROM Concerns: Dorsiflexed: []  Right   []  Left Plantar flexed: []  Right    []  Left Inversion: []  Right    []  Left Eversion: []  Right    []   Left  LE Edema: []  1+ (Barely detectable impression when finger is pressed into skin) []  2+ (slight indentation. 15 seconds to rebound) []  3+ (deeper indentation. 30 seconds to rebound) []  4+ (>30 seconds to rebound)  UE Measurements:  UPPER EXTREMITY ROM:   Active ROM Right 03/22/2023 Left 03/22/2023  Shoulder flexion [facility]  Shoulder abduction [facility]  Shoulder adduction    Elbow flexion [facility] WFL  Elbow extension [facility] WFL  Wrist flexion    Wrist extension    (Blank rows = not tested)  UPPER EXTREMITY MMT:  MMT Right 03/22/2023 Left 03/22/2023  Shoulder flexion 5 5  Shoulder abduction 5 5  Shoulder adduction    Elbow flexion 5 5  Elbow extension 5 5  Wrist flexion    Wrist extension    Pinch strength    Grip strength WFL WFL  (Blank rows = not tested)  Shoulder Posture:  Right Tendency towards Left  []   Functional []    [x]   Elevation []    []   Depression [x]    [x]   Protraction [x]    []   Retraction []    []   Internal rotation []    []   External rotation []    []   Subluxed []     UE Tone: [x]  Normal []  Flaccid []  Low tone []  Spasticity  []  Dystonia []  Other:   UE Edema: []  1+ (Barely detectable impression when finger is pressed into skin) []  2+ (slight indentation. 15 seconds to rebound) []  3+ (deeper indentation. 30 seconds to rebound) []  4+ (>30 seconds to rebound)  Wrist/Hand: Handedness: [x]  Right   []  Left   []  NA: Comments:  Right  Left  [x]   WNL [x]    []   Limitations []    []   Contractures []    []   Fisting []    []   Tremors []    []   Weak grasp []    []   Poor dexterity []    []   Hand movement non functional []    []   Paralysis []         MOBILITY BASE RECOMMENDATIONS and JUSTIFICATION:  MOBILITY BASE  JUSTIFICATION   Manufacturer:   [employer] Model:                 A6             Color: black Seat Width: 17" Seat Depth: 20"   [x]  Manual mobility base (continue below)   []  Scooter/POV  []  Power mobility base   Number of  hours per day spent in above selected mobility base: 16 hours per day  Typical daily mobility base use Schedule: Patient will utilize his manual wheelchair to perform all of his functional mobility in the home. It will allow him to travel between rooms of his home safely and efficiently in order to complete all of his MRADLs. Pt is able to perform stand pivot transfers mod I with his RW . Due to impaired endurance, R shoulder pain, and diagnosis of MS he is unable to safely and efficiently propel himself in a K0004 manual wheelchair.    [x]  is not a safe, functional ambulator  []  limitation prevents from completing a MRADL(s) within a reasonable time frame    [x]  limitation places at high risk of morbidity or mortality secondary to  the attempts to perform a    MRADL(s)  []  limitation prevents accomplishing a MRADL(s) entirely  [x]  provide independent mobility  [x]  equipment is a lifetime medical need  [x]  walker or cane inadequate  [x]  any type manual wheelchair      inadequate  []  scooter/POV inadequate      []  requires dependent mobility             MANUAL MOBILITY      []  Standard manual wheelchair  K0001      Arm:    []  both []  right  []  left      Foot:   []  both []  right   []  left  []  self-propels wheelchair  []  will use on regular basis  []  chair fits throughout home  []  willing and motivated to use  []  propels with assistance     []  dependent use   []  Standard hemi-manual wheelchair  K0002      Arm:    []  both []  right  []  left      Foot:   []  both []  right   []  left  []  lower seat height required to foot propel  []  short stature  []  self-propels wheelchair  []  will use on regular basis  []  chair fits throughout home  []  willing and motivated to use   []  propels with assistance  []  dependent use   []  Lightweight manual wheelchair  K0003      Arm:    []  both []  right  []  left      Foot:   []  both  []  right  []  left                   []  hemi height required  []   medical condition and weight of  wheelchair affect ability to self      propel standard manual wheelchair in the residence  []  can and does self-propel (marginal propulsion  skills)  []  daily use _________hours  []  chair fits throughout home  []  willing and motivated to use  []  lower seat height required to foot propel  []  short stature   []  High strength lightweight manual  wheelchair (Breezy Ultra 4)  K0004     Arm:    []  both []  right  []  left     Foot:   []  both []  right   []  left                                                                  []  hemi height required []  medical condition and weight of wheelchair affect ability to self propel while engaging in frequent MRADL(s) that cannot be performed in a standard or lightweight manual wheelchair  []  daily use _________hours  []  chair fits throughout home  []  willing and motivated to use  []  prevent repetitive use injuries   []  lower seat height required to foot propel  []  short stature    [x]  Ultra-lightweight manual wheelchair  K0005     Arm:    [x]  both []  right  []  left     Foot:   []  both []  right  []  left       []  hemi height required  []  heavy duty    Front seat to floor __19___ inches      Rear seat to floor __17 3/4___ inches      Back height __20___ inches     Back angle ___90___ degrees      Front angle _____ degrees  [x]   full-time manual wheelchair user  [x]  Requires individualized fitting and optimal adjustments for multiple features that include adjustable axle configuration, fully adjustable center of gravity, wheel camber, seat and back angle, angle of seat slope, which cannot be accommodated by a K0001 through K0004 manual wheelchair  [x]  prevent repetitive use injuries  [x]  daily use__16______hours   []  user has high activity patterns that frequently require  them  to go out into the community for the purpose of independently accomplishing high level MRADL activities. Examples of these might include a  combination of; shopping, work, school, Photographer, childcare, independently loading and unloading from a vehicle etc.  []  lower seat height required to foot propel  []  short stature  []  heavy duty -  weight over 250lbs   []  Current chair is a K0005   manufacture:___________________  model:_________________  serial#____________________  age:_________    [x]  First time [id number] user  Due to patient's significant LE weakness he has been relying heavily on his BUE and shoulders to perform short-distance gait with his RW and has had onset of R shoulder pain. He requires a K0005 manual wheelchair that is custom fit for him in order to prevent further wear and tear on his shoulder joints and to allow for safer and more independent functional mobility.   What features of the K0005 w/c are needed as compared to the K0004 base? Why?:  [x]  adjustable seat and back angle changes the angle of seat slope of the frame to attain a gravity assisted position for efficient propulsion and proper weight distribution along the frame     [x]  the front of the wheelchair will be configured higher than the back  of the chair to allow gravity to assist the user with postural stability  [x]  the center of the wheel will be positioned for stability, safety and efficient propulsion  [x]  adjustable axle allows for vertical, horizontal, camber and overall width changes  throughout the wheels for adjustment of the client's exact needs and abilities.   [x]  adjustable axle increases the stability and function of the chair allowing for adjustment of the center of gravity.   [x]  accommodates the client's anatomical position in the chair maximizing independence in mobility and maneuverability in all environments.   []  create a minimal fixed tilt-in space to assist in positioning.   [x]  Describe users full-time manual wheelchair activity patterns:___  Mr. Ledonne will utilize a manual wheelchair for all of his mobility in his home and  to complete his ADLs. He is limited to walking very short distances and is able to only tolerate standing for short periods of time with his RW and therefore needs wheeled mobility to complete his ADLs and perform mobility in his home. He is able to stand up to perform pressure relief and/or perform wheelchair push-ups.    []  Power assist Comments:  []  prevent repetitive use injuries  []  repetitive strain injury present in    shoulder girdle    []  shoulder pain is (> or =) to 7/10     during manual propulsion       Current Pain _____/10  []  requires conservation of energy to participate in MRADL(s) runable to propel up ramps or curbs using manual wheelchair  []  been K0005 user greater than one year  []  user unwilling to use power      wheelchair (reason): []  less expensive option to power   wheelchair   []  rim activated power assist -      decreased strength   []  Heavy duty manual wheelchair       K0006     Arm:    []  both []  right  []  left     Foot:   []  both []  right  []  left     []  hemi height required    []  Dependent base  []  user exceeds 250lbs  []  non-functional ambulator    []  extreme spasticity  []  over active movement   []  broken frame/hx of repeated     repairs  []  able to self-propel in residence       []  lower seat to floor height required  []  unable to self-propel in residence   []  Extra heavy duty manual wheelchair  K0007     Arm:    []  both []  right  []  left     Foot:   []  both []  right  []  left     []  hemi height required  []  Dependent base  []  user exceeds 300lbs  []  non-functional ambulator    []  able to self-propel in residence   []  lower seat to floor height required  []  unable to self-propel in residence     []  Manual wheelchair with tilt [phone]      (Manual "Tilt-n-Space")  []  patient is dependent for transfers  []  patient requires frequent       positioning for pressure relief   []  patient requires frequent      positioning for poor/absent trunk control         []  Stroller Base  []  infant/child   []  unable to propel manual      wheelchair  []  allows for  growth  []  non-functional ambulator  []  non-functional UE  []  independent mobility is not a goal at this time    MANUAL FRAME OPTIONS      Push handles  []  extended   []  angle adjustable   [x]  standard  []  caregiver access  []  caregiver assist    []  allows "hooking" to enable      increased ability to perform ADLs or maintain balance   [x]  Angle Adjustable Back  [x]  postural control  [x]  control of tone/spasticity  []  accommodation of range of motion  []  UE functional control  [x]  accommodation for seating system    Rear wheel placement  []  std/fixed  [x] fully adjustableramputee   [x]  camber ___3____degree  [x]  removable rear wheel  []  non-removable rear wheel  Wheel size __24_____  Wheel style______spoke_________________  [x]  improved UE access to wheels  [x]  increase propulsion ability  [x]  improved stability  [x]  changing angle in space for      improvement of postural stability  [x]  remove for transport    [x]  allow for seating system to fit on  base  []  amputee placement  []  1-arm drive access   r R  r L  []  enable propulsion of manual       wheelchair with one arm    []  amputee placement   Wheel rims/ Hand rims  [x]  Standard    []  Specialized-____ [x]  provide ability to propel manual   []  increase self-propulsion with hand wheelchair weakness/decreased grasp     []  Spoke protector/guard   []  prevent hands from getting caught in spokes   Tires:  [x]  pneumatic  [x]  flat free inserts  []  solid  Style:  []  decrease roll resistance              [x]  prevent frequent flats  []  increase shock absorbency  [x]  decrease maintenance   []  decrease pain from road shock    []  decrease spasms from road shock    Wheel Locks:    []  push [x]  pull []  scissor  [x]  lock wheels for transfers  [x]  lock wheels from rolling   Brake/wheel lock extension:  []  R  []  L  []  allow user to  operate wheel locks due to decreased reach or strength   Caster housing: standard Caster size:     6 x 1 1/4                 Style:                  poly                        []  suspension fork  [x]  maneuverability   [x]  stability of wheelchair   [x]  durability  []  maintenance  []  angle adjustment for posture  []  allow for feet to come under        wheelchair base  []  allows change in seat to floor  height   []  increase shock absorbency  []  decrease pain from road shock  []  decrease spasms from road    shock   []  Side guards  []  prevent clothing getting caught in wheel or becoming soiled   [] provide hip and pelvic stability  []  eliminates contact between body and wheels  []  limit hand contact with wheels   [x]  Anti-tippers      [x]  prevent wheelchair from tipping    backward  []  assist caregiver  with curbs       CHAIR OPTIONS MANUAL & POWER      Armrests   [x]  adjustable height []  removable  []  swing away []  fixed  [x]  flip back  []  reclining  []  full length pads [x]  desk []  tube arms []  gel pads  [x]  provide support with elbow at 90    [x]  remove/flip back/swing away for  transfers  [x]  provide support and positioning of upper body    [x]  allow to come closer to table top  [x]  remove for access to tables  []  provide support for w/c tray  [x]  change of height/angles for variable activities   []  Elbow support / Elbow stop  []  keep elbow positioned on arm pad  []  keep arms from falling off arm pad  during tilt and/or recline   Upper Extremity Support  []  Arm trough  []   R  []   L  Style:  []  swivel mount []  fixed mount   []  posterior hand support  []   tray  []  full tray  []  joystick cut out  []   R  []   L  Style:  []  decrease gravitational pull on      shoulders  []  provide support to increase UE  function  []  provide hand support in natural    position  []  position flaccid UE  []  decrease subluxation    []  decrease edema       []  manage spasticity   []  provide  midline positioning  []  provide work surface  []  placement for AAC/ Computer/ EADL       Hangers/ Legrests   [x]  ___70___ degree  []  Elevating []  articulating  [x]  swing away []  fixed [x]  lift off  []  heavy duty  []  adjustable knee angle  []  adjustable calf panel   []  longer extension tube              [x]  provide LE support  []  maintain placement of feet on      footplate   [x]  accommodate lower leg length  []  accommodate to hamstring       tightness  [x]  enable transfers  [x]  provide change in position for LE's  []  elevate legs during recline    []  decrease edema  []  durability      Foot support   [x]  footplate [x]  R [x]  L [x]  flip up           []  Depth adjustable   [x]  angle adjustable  []  foot board/one piece    [x]  provide foot support  []  accommodate to ankle ROM  []  allow foot to go under wheelchair base  [x]  enable transfers     []  Shoe holders  []  position foot    []  decrease / manage spasticity  []  control position of LE  []  stability    []  safety     []  Ankle strap/heel      loops  []  support foot on foot support  []  decrease extraneous movement  []  provide input to heel   []  protect foot     []  Amputee adapter []  R  []  L     Style:                  Size:  []  Provide support for stump/residual extremity    []  Transportation tie-down  []  to provide crash tested tie-down brackets    []  Crutch/cane holder    []  O2 holder    []   IV hanger   []  Ventilator tray/mount    []  stabilize accessory on wheelchair       Component  Justification     [x]  Seat cushion     Axiom S []  accommodate impaired sensation  []  decubitus ulcers present or history  []  unable to shift weight  [x]  increase pressure distribution  []  prevent pelvic extension  [x]  custom required "off-the-shelf"    seat cushion will not accommodate deformity  []  stabilize/promote pelvis alignment  []  stabilize/promote femur alignment  []  accommodate obliquity  []  accommodate multiple deformity  []   incontinent/accidents  [x]  low maintenance     []  seat mounts                 []  fixed []  removable  []  attach seat platform/cushion to wheelchair frame    []  Seat wedge    []  provide increased aggressiveness of seat shape to decrease sliding  down in the seat  []  accommodate ROM        []  Cover replacement   []  protect back or seat cushion  []  incontinent/accidents    []  Solid seat / insert    []  support cushion to prevent      hammocking  []  allows attachment of cushion to mobility base    []  Lateral pelvic/thigh/hip     support (Guides)     []  decrease abduction  []  accommodate pelvis  []  position upper legs  []  accommodate spasticity  []  removable for transfers     []  Lateral pelvic/thigh      supports mounts  []  fixed   []  swing-away   []  removable  []  mounts lateral pelvic/thigh supports     []  mounts lateral pelvic/thigh supports swing-away or removable for transfers    []  Medial thigh support (Pommel)  [] decrease adduction  [] accommodate ROM  []  remove for transfers   []  alignment      []  Medial thigh   []  fixed      support mounts      []  swing-away   []  removable  []  mounts medial thigh supports   []  Mounts medial supports swing- away or removable for transfers       Component  Justification   [x]  Back    Tension adjustable   [x]  provide posterior trunk support []  facilitate tone  []  provide lumbar/sacral support []  accommodate deformity  []  support trunk in midline   [x]  custom required "off-the-shelf" back support will not accommodate deformity   []  provide lateral trunk support []  accommodate or decrease tone            []  Back mounts  []  fixed  []  removable  []  attach back rest/cushion to wheelchair frame   []  Lateral trunk      supports  []  R []  L  []  decrease lateral trunk leaning  []  accommodate asymmetry    []  contour for increased contact  []  safety    []  control of tone    []  Lateral trunk      supports mounts  []  fixed  []  swing-away   []  removable   []  mounts lateral trunk supports     []  Mounts lateral trunk supports swing-away or removable for transfers   []  Anterior chest      strap, vest     []  decrease forward movement of shoulder  []  decrease forward movement of trunk  []  safety/stability  []  added abdominal support  []  trunk alignment  []  assistance with  shoulder control   []  decrease shoulder elevation    []  Headrest      []  provide posterior head support  []  provide posterior neck support  []  provide lateral head support  []  provide anterior head support  []  support during tilt and recline  []  improve feeding     []  improve respiration  []  placement of switches  []  safety    []  accommodate ROM   []  accommodate tone  []  improve visual orientation   []  Headrest           []  fixed []  removable []  flip down      Mounting hardware   []  swing-away laterals/switches  []  mount headrest   []  mounts headrest flip down or  removable for transfers  []  mount headrest swing-away laterals   []  mount switches     []  Neck Support    []  decrease neck rotation  []  decrease forward neck flexion   Pelvic Positioner    []  std hip belt          []  padded hip belt  []  dual pull hip belt  []  four point hip belt  []  stabilize tone  []  decrease falling out of chair  []  prevent excessive extension  []  special pull angle to control      rotation  []  pad for protection over boney   prominence  []  promote comfort    []  Essential needs        bag/pouch   []  medicines []  special food rorthotics []  clothing changes  []  diapers  []  catheter/hygiene []  ostomy supplies   The above equipment has a life- long use expectancy.  Growth and changes in medical and/or functional conditions would be the exceptions.   SUMMARY:  Why mobility device was selected; include why a lower level device is not appropriate: Patient requires use of a manual wheelchair in order to perform safe and independent mobility in his home and in order to perform his MRADLs in  a timely manner. Due to his impaired endurance and history of MS he is unable to safely and efficiently propel himself in a K0004 manual wheelchair without putting significant strain on his shoulder joints and putting himself at risk for injury. Additionally, due to his history of MS leading to significant BLE weakness as noted above he is unable to ambulate further than short distances and has poor standing tolerance, therefore requiring a custom ultralightweight manual wheelchair (K0005) to allow for safe and independent functional mobility.  ASSESSMENT:  CLINICAL IMPRESSION: Patient is a 52 y.o. male who was seen today for physical therapy evaluation and treatment for a new ultra lightweight manual wheelchair.    OBJECTIVE IMPAIRMENTS Abnormal gait, decreased activity tolerance, decreased balance, decreased endurance, decreased mobility, difficulty walking, decreased ROM, decreased strength, increased muscle spasms, improper body mechanics, postural dysfunction, and pain.   ACTIVITY LIMITATIONS carrying, lifting, bending, standing, and stairs  PARTICIPATION LIMITATIONS: driving, shopping, and community activity   CLINICAL DECISION MAKING: Stable/uncomplicated  EVALUATION COMPLEXITY: High                                   GOALS: One time visit. No goals established.    PLAN: PT FREQUENCY: one time visit    Peter Congo, PT Peter Congo, PT, DPT, CSRS  03/22/2023, 10:46 AM    I concur with the above findings and recommendations of the therapist:  Physician name printed:         Physician's signature:      Date:

## 2023-03-25 ENCOUNTER — Ambulatory Visit: Payer: Medicare PPO | Admitting: Physical Therapy

## 2023-04-03 ENCOUNTER — Telehealth: Payer: Self-pay | Admitting: *Deleted

## 2023-04-03 NOTE — Telephone Encounter (Signed)
Faxed signed orders back to [employer] re: manual wheelchair at [phone]. Received fax confirmation.

## 2023-04-10 DIAGNOSIS — E1065 Type 1 diabetes mellitus with hyperglycemia: Secondary | ICD-10-CM | POA: Diagnosis not present

## 2023-04-17 ENCOUNTER — Telehealth: Payer: Self-pay | Admitting: *Deleted

## 2023-04-17 NOTE — Telephone Encounter (Signed)
Received [facility] orders for AFO for left foot drop. Order signed and faxed to [phone] confirmation received.

## 2023-04-22 ENCOUNTER — Telehealth: Payer: Self-pay | Admitting: Neurology

## 2023-04-22 ENCOUNTER — Other Ambulatory Visit: Payer: Self-pay

## 2023-04-22 MED ORDER — BACLOFEN 10 MG PO TABS: 10.0000 mg | ORAL_TABLET | Freq: Three times a day (TID) | ORAL | 0 refills | Status: DC

## 2023-04-22 NOTE — Telephone Encounter (Signed)
Pt request refill for baclofen (LIORESAL) 10 MG tablet send to  [facility] [phone]

## 2023-04-22 NOTE — Telephone Encounter (Signed)
Called pt's pharmacy to verify that pt picked up his Baclofen on 03/04/2023 and pharmacy had confirmed pt did pick up script on 03/04/2023 and pt got a 90 day supply of medication.   Called pt and let him know that I spoke with his pharmacy and he got a 90 day supply of his Baclofen and that he shouldn't need another refill until January 7th. Pt stated that he knows that, but he is looking at his bottle and he only has 13 pills left. Told pt that he shouldn't be out of medication and asked him if he was taking his medication right Take 1 tablet 3x a day "AS NEEDED" for muscle spasms. Pt stated that he was, but he is unsure about what happened and why he was about to be out of medication. Told pt I will let Dr. Epimenio Foot know and see what he recommends and we will let him know.

## 2023-04-22 NOTE — Addendum Note (Signed)
Addended by: Leandra Kern R on: 04/22/2023 01:11 PM   Modules accepted: Orders

## 2023-04-22 NOTE — Telephone Encounter (Signed)
Called pt and let him know per Dr. Epimenio Foot that Dr. Epimenio Foot approved his refill request and has sent to his pharmacy. Pt verbalized understanding.

## 2023-05-07 DIAGNOSIS — M21372 Foot drop, left foot: Secondary | ICD-10-CM | POA: Diagnosis not present

## 2023-05-16 DIAGNOSIS — G35 Multiple sclerosis: Secondary | ICD-10-CM | POA: Diagnosis not present

## 2023-05-16 DIAGNOSIS — R269 Unspecified abnormalities of gait and mobility: Secondary | ICD-10-CM | POA: Diagnosis not present

## 2023-05-16 DIAGNOSIS — E108 Type 1 diabetes mellitus with unspecified complications: Secondary | ICD-10-CM | POA: Diagnosis not present

## 2023-05-30 DIAGNOSIS — R3912 Poor urinary stream: Secondary | ICD-10-CM | POA: Diagnosis not present

## 2023-05-30 DIAGNOSIS — N130 Hydronephrosis with ureteropelvic junction obstruction: Secondary | ICD-10-CM | POA: Diagnosis not present

## 2023-05-30 DIAGNOSIS — N319 Neuromuscular dysfunction of bladder, unspecified: Secondary | ICD-10-CM | POA: Diagnosis not present

## 2023-05-30 DIAGNOSIS — R350 Frequency of micturition: Secondary | ICD-10-CM | POA: Diagnosis not present

## 2023-05-30 DIAGNOSIS — R3914 Feeling of incomplete bladder emptying: Secondary | ICD-10-CM | POA: Diagnosis not present

## 2023-05-31 DIAGNOSIS — H18831 Recurrent erosion of cornea, right eye: Secondary | ICD-10-CM | POA: Diagnosis not present

## 2023-05-31 DIAGNOSIS — S0500XD Injury of conjunctiva and corneal abrasion without foreign body, unspecified eye, subsequent encounter: Secondary | ICD-10-CM | POA: Diagnosis not present

## 2023-05-31 DIAGNOSIS — H04123 Dry eye syndrome of bilateral lacrimal glands: Secondary | ICD-10-CM | POA: Diagnosis not present

## 2023-06-03 DIAGNOSIS — H18831 Recurrent erosion of cornea, right eye: Secondary | ICD-10-CM | POA: Diagnosis not present

## 2023-06-03 DIAGNOSIS — S0500XD Injury of conjunctiva and corneal abrasion without foreign body, unspecified eye, subsequent encounter: Secondary | ICD-10-CM | POA: Diagnosis not present

## 2023-06-03 DIAGNOSIS — H04123 Dry eye syndrome of bilateral lacrimal glands: Secondary | ICD-10-CM | POA: Diagnosis not present

## 2023-06-07 DIAGNOSIS — H18831 Recurrent erosion of cornea, right eye: Secondary | ICD-10-CM | POA: Diagnosis not present

## 2023-06-07 DIAGNOSIS — S0500XD Injury of conjunctiva and corneal abrasion without foreign body, unspecified eye, subsequent encounter: Secondary | ICD-10-CM | POA: Diagnosis not present

## 2023-06-07 DIAGNOSIS — H04123 Dry eye syndrome of bilateral lacrimal glands: Secondary | ICD-10-CM | POA: Diagnosis not present

## 2023-06-11 ENCOUNTER — Other Ambulatory Visit: Payer: Self-pay | Admitting: Internal Medicine

## 2023-06-11 DIAGNOSIS — E782 Mixed hyperlipidemia: Secondary | ICD-10-CM

## 2023-06-11 MED ORDER — SIMVASTATIN 20 MG PO TABS
20.0000 mg | ORAL_TABLET | Freq: Every day | ORAL | 0 refills | Status: DC
Start: 2023-06-11 — End: 2023-09-10

## 2023-06-11 NOTE — Telephone Encounter (Signed)
 Copied from CRM [id number]. Topic: Clinical - Medication Refill >> Jun 11, 2023  1:51 PM Johnathan Ross wrote: Most Recent Primary Care Visit:  Provider: ALLEN, NICKEAH E  Department: [facility]  Visit Type: MEDICARE AWV, SEQUENTIAL  Date: 09/10/2022  Medication: simvastatin  (ZOCOR ) 20 MG tablet  Has the patient contacted their pharmacy? Yes (Agent: If no, request that the patient contact the pharmacy for the refill. If patient does not wish to contact the pharmacy document the reason why and proceed with request.) (Agent: If yes, when and what did the pharmacy advise?)  Is this the correct pharmacy for this prescription? Yes If no, delete pharmacy and type the correct one.  This is the patient's preferred pharmacy:  [id number] [place] [place] - [address] [facility] [address] [place] [address] Phone: [phone] Fax: [phone]     Has the prescription been filled recently? Yes  Is the patient out of the medication? Yes  Has the patient been seen for an appointment in the last year OR does the patient have an upcoming appointment? Yes  Can we respond through MyChart? Yes  Agent: Please be advised that Rx refills may take up to 3 business days. We ask that you follow-up with your pharmacy.

## 2023-06-17 ENCOUNTER — Ambulatory Visit: Payer: Medicare PPO | Admitting: Internal Medicine

## 2023-06-17 ENCOUNTER — Encounter: Payer: Self-pay | Admitting: Internal Medicine

## 2023-06-17 ENCOUNTER — Other Ambulatory Visit: Payer: Self-pay | Admitting: Internal Medicine

## 2023-06-17 VITALS — BP 122/70 | HR 87 | Ht 73.0 in | Wt 207.0 lb

## 2023-06-17 DIAGNOSIS — R0683 Snoring: Secondary | ICD-10-CM

## 2023-06-17 DIAGNOSIS — Z114 Encounter for screening for human immunodeficiency virus [HIV]: Secondary | ICD-10-CM | POA: Insufficient documentation

## 2023-06-17 DIAGNOSIS — E039 Hypothyroidism, unspecified: Secondary | ICD-10-CM | POA: Diagnosis not present

## 2023-06-17 DIAGNOSIS — E1042 Type 1 diabetes mellitus with diabetic polyneuropathy: Secondary | ICD-10-CM

## 2023-06-17 DIAGNOSIS — E785 Hyperlipidemia, unspecified: Secondary | ICD-10-CM | POA: Diagnosis not present

## 2023-06-17 DIAGNOSIS — I10 Essential (primary) hypertension: Secondary | ICD-10-CM

## 2023-06-17 DIAGNOSIS — Z23 Encounter for immunization: Secondary | ICD-10-CM

## 2023-06-17 LAB — CBC WITH DIFFERENTIAL/PLATELET
Basophils Absolute: 0 10*3/uL (ref 0.0–0.1)
Basophils Relative: 0.5 % (ref 0.0–3.0)
Eosinophils Absolute: 0.4 10*3/uL (ref 0.0–0.7)
Eosinophils Relative: 5 % (ref 0.0–5.0)
HCT: 45.5 % (ref 39.0–52.0)
Hemoglobin: 15 g/dL (ref 13.0–17.0)
Lymphocytes Relative: 15.5 % (ref 12.0–46.0)
Lymphs Abs: 1.3 10*3/uL (ref 0.7–4.0)
MCHC: 33 g/dL (ref 30.0–36.0)
MCV: 88 fL (ref 78.0–100.0)
Monocytes Absolute: 0.6 10*3/uL (ref 0.1–1.0)
Monocytes Relative: 6.5 % (ref 3.0–12.0)
Neutro Abs: 6.1 10*3/uL (ref 1.4–7.7)
Neutrophils Relative %: 72.5 % (ref 43.0–77.0)
Platelets: 225 10*3/uL (ref 150.0–400.0)
RBC: 5.17 Mil/uL (ref 4.22–5.81)
RDW: 14.2 % (ref 11.5–15.5)
WBC: 8.5 10*3/uL (ref 4.0–10.5)

## 2023-06-17 LAB — MICROALBUMIN / CREATININE URINE RATIO
Creatinine,U: 139.3 mg/dL
Microalb Creat Ratio: 0.5 mg/g (ref 0.0–30.0)
Microalb, Ur: <0.7 mg/dL (ref 0.0–1.9)

## 2023-06-17 LAB — LIPID PANEL
Cholesterol: 137 mg/dL (ref 0–200)
HDL: 48.1 mg/dL (ref >39.00)
LDL Cholesterol: 70 mg/dL (ref 0–99)
NonHDL: 88.54
Total CHOL/HDL Ratio: 3
Triglycerides: 93 mg/dL (ref 0.0–149.0)
VLDL: 18.6 mg/dL (ref 0.0–40.0)

## 2023-06-17 LAB — BASIC METABOLIC PANEL
BUN: 15 mg/dL (ref 6–23)
CO2: 32 meq/L (ref 19–32)
Calcium: 9.3 mg/dL (ref 8.4–10.5)
Chloride: 102 meq/L (ref 96–112)
Creatinine, Ser: 1.15 mg/dL (ref 0.40–1.50)
GFR: 74.02 mL/min (ref >60.00)
Glucose, Bld: 145 mg/dL — ABNORMAL HIGH (ref 70–99)
Potassium: 4.6 meq/L (ref 3.5–5.1)
Sodium: 141 meq/L (ref 135–145)

## 2023-06-17 LAB — HEPATIC FUNCTION PANEL
ALT: 17 U/L (ref 0–53)
AST: 15 U/L (ref 0–37)
Albumin: 4.3 g/dL (ref 3.5–5.2)
Alkaline Phosphatase: 79 U/L (ref 39–117)
Bilirubin, Direct: 0.1 mg/dL (ref 0.0–0.3)
Total Bilirubin: 0.4 mg/dL (ref 0.2–1.2)
Total Protein: 7 g/dL (ref 6.0–8.3)

## 2023-06-17 LAB — TSH: TSH: 0.62 u[IU]/mL (ref 0.35–5.50)

## 2023-06-17 MED ORDER — GVOKE HYPOPEN 2-PACK 1 MG/0.2ML ~~LOC~~ SOAJ: 1.0000 | Freq: Every day | SUBCUTANEOUS | 5 refills | Status: DC | PRN

## 2023-06-17 NOTE — Progress Notes (Unsigned)
Subjective:  Patient ID: Johnathan Ross, male    DOB: 11/18/1970  Age: 53 y.o. MRN: [id number]  CC: Hypertension, Hyperlipidemia, and Diabetes   HPI Johnathan Ross presents for f/up ----  Discussed the use of AI scribe software for clinical note transcription with the patient, who gave verbal consent to proceed.  History of Present Illness   The patient, with a history of diabetes, hypertension, and hyperlipidemia, reports no symptoms of heart disease, such as chest pain or shortness of breath. He has been checking his blood sugars daily with a pump and CGM. The last recorded A1c in October was 7.5%, but the patient believes it to be closer to 6.9%. He is currently taking cholesterol medication without any reported side effects, such as muscle aches.  The patient also reports issues with urinary function, with medications prescribed by a urologist not providing the desired relief. He denies any hematuria.  The patient's partner reports that the patient snores loudly during sleep, but there are no reports of apnea. The patient also reports nasal congestion, particularly at night, which clears up upon getting up and moving around.  He has a history of adverse reaction to combined flu and pneumonia vaccines. The patient also reports regular dental visits, with a recent examination prompting a discussion about potential sleep apnea.       Outpatient Medications Prior to Visit  Medication Sig Dispense Refill   gabapentin (NEURONTIN) 100 MG capsule Take 1 capsule (100 mg total) by mouth 3 (three) times daily. (Patient not taking: Reported on 06/20/2023) 90 capsule 5   glucose blood (CONTOUR NEXT TEST) test strip USE 2 TIMES PER DAY. E10.65 (Patient not taking: Reported on 06/20/2023) 200 strip 3   levocetirizine (XYZAL) 5 MG tablet TAKE 1 TABLET BY MOUTH EVERY DAY IN THE EVENING (Patient not taking: Reported on 06/20/2023) 90 tablet 1   losartan (COZAAR) 50 MG tablet TAKE 1 TABLET BY MOUTH  EVERY DAY 90 tablet 3   NOVOLOG 100 UNIT/ML injection USE UP TO 80 UNITS IN INSULIN PUMP DAILY-DX CODE E10.8 70 mL 2   OVER THE COUNTER MEDICATION Sea moss supplement-vitamin and minerals daily (Patient not taking: Reported on 06/20/2023)     silodosin (RAPAFLO) 8 MG CAPS capsule Take 1 capsule (8 mg total) by mouth daily with breakfast. (Patient not taking: Reported on 06/20/2023) 30 capsule 11   simvastatin (ZOCOR) 20 MG tablet Take 1 tablet (20 mg total) by mouth daily at 6 PM. 90 tablet 0   solifenacin (VESICARE) 5 MG tablet TAKE 1 TABLET (5 MG TOTAL) BY MOUTH DAILY. 90 tablet 0   baclofen (LIORESAL) 10 MG tablet Take 1 tablet (10 mg total) by mouth 3 (three) times daily. 270 tablet 0   Continuous Blood Gluc Receiver (DEXCOM G5 RECEIVER KIT) DEVI by Does not apply route. (Patient not taking: Reported on 03/19/2023)     Continuous Blood Gluc Sensor (DEXCOM G6 SENSOR) MISC Use as directed. (Patient not taking: Reported on 03/19/2023) 12 each 1   Continuous Blood Gluc Transmit (DEXCOM G6 TRANSMITTER) MISC Use to monitor blood sugar (Patient not taking: Reported on 03/19/2023) 1 each 0   dalfampridine 10 MG TB12 Take 1 tablet (10 mg total) by mouth 2 (two) times daily. 180 tablet 1   Glucagon (GVOKE HYPOPEN 2-PACK) 1 MG/0.2ML SOAJ Inject 1 Act into the skin daily as needed. 2 mL 5   SYNTHROID 125 MCG tablet TAKE 1 TABLET BY MOUTH 6 DAYS PER WEEK 72 tablet 1  No facility-administered medications prior to visit.    ROS Review of Systems  Constitutional:  Positive for unexpected weight change (wt gain). Negative for appetite change, chills, diaphoresis and fatigue.  HENT:  Positive for congestion.   Eyes:  Negative for visual disturbance.  Respiratory:  Negative for apnea, cough, shortness of breath and wheezing.   Cardiovascular:  Negative for chest pain, palpitations and leg swelling.  Gastrointestinal: Negative.  Negative for abdominal pain, constipation, diarrhea, nausea and vomiting.   Endocrine: Negative.   Genitourinary:  Positive for difficulty urinating. Negative for dysuria, hematuria, testicular pain and urgency.  Musculoskeletal:  Negative for arthralgias and myalgias.  Neurological:  Positive for weakness.  Hematological:  Negative for adenopathy. Does not bruise/bleed easily.  Psychiatric/Behavioral: Negative.      Objective:  BP 122/70 (BP Location: Right Arm, Patient Position: Sitting, Cuff Size: Normal)   Pulse 87   Ht 6\' 1"  (1.854 m)   Wt 207 lb (93.9 kg)   SpO2 98%   BMI 27.31 kg/m   BP Readings from Last 3 Encounters:  06/20/23 130/78  06/17/23 122/70  03/19/23 136/70    Wt Readings from Last 3 Encounters:  06/20/23 209 lb (94.8 kg)  06/17/23 207 lb (93.9 kg)  03/19/23 197 lb 3.2 oz (89.4 kg)    Physical Exam Vitals reviewed.  Constitutional:      Appearance: Normal appearance.  HENT:     Nose: Nose normal.     Mouth/Throat:     Mouth: Mucous membranes are moist.  Eyes:     General: No scleral icterus.    Conjunctiva/sclera: Conjunctivae normal.  Cardiovascular:     Rate and Rhythm: Normal rate and regular rhythm.     Heart sounds: No murmur heard. Pulmonary:     Effort: Pulmonary effort is normal.     Breath sounds: No stridor. No wheezing, rhonchi or rales.  Abdominal:     General: Abdomen is flat.     Palpations: There is no mass.     Tenderness: There is no abdominal tenderness. There is no guarding.     Hernia: No hernia is present.  Musculoskeletal:     Cervical back: Neck supple.  Lymphadenopathy:     Cervical: No cervical adenopathy.  Skin:    General: Skin is warm.     Findings: No lesion or rash.  Neurological:     Mental Status: He is alert. Mental status is at baseline.  Psychiatric:        Mood and Affect: Mood normal.        Behavior: Behavior normal.     Lab Results  Component Value Date   WBC 8.5 06/17/2023   HGB 15.0 06/17/2023   HCT 45.5 06/17/2023   PLT 225.0 06/17/2023   GLUCOSE 145 (H)  06/17/2023   CHOL 137 06/17/2023   TRIG 93.0 06/17/2023   HDL 48.10 06/17/2023   LDLDIRECT 65.0 02/09/2021   LDLCALC 70 06/17/2023   ALT 17 06/17/2023   AST 15 06/17/2023   NA 141 06/17/2023   K 4.6 06/17/2023   CL 102 06/17/2023   CREATININE 1.15 06/17/2023   BUN 15 06/17/2023   CO2 32 06/17/2023   TSH 0.62 06/17/2023   PSA 1.11 08/02/2022   HGBA1C 7.5 (H) 03/15/2023   MICROALBUR <0.7 06/17/2023    No results found.  Assessment & Plan:   Essential hypertension- His BP is well controlled. -     Basic metabolic panel; Future -     CBC  with Differential/Platelet; Future  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HCC) -     Microalbumin / creatinine urine ratio; Future -     [place]; Inject 1 Act into the skin daily as needed. (Patient not taking: Reported on 06/20/2023)  Dispense: 0.6 mL; Refill: 5  Encounter for screening for HIV -     HIV Antibody (routine testing w rflx); Future  Dyslipidemia, goal LDL below 130 -     Lipid panel; Future -     Hepatic function panel; Future  Acquired hypothyroidism- He is euthyroid. -     TSH; Future  Need for immunization against influenza -     Flu vaccine trivalent PF, 6mos and older(Flulaval,Afluria,Fluarix,Fluzone)  Loud snoring -     Ambulatory referral to Sleep Studies     Follow-up: Return in about 6 months (around 12/15/2023).  Sanda Linger, MD

## 2023-06-17 NOTE — Patient Instructions (Signed)

## 2023-06-18 ENCOUNTER — Other Ambulatory Visit: Payer: Self-pay | Admitting: Internal Medicine

## 2023-06-18 ENCOUNTER — Other Ambulatory Visit: Payer: Self-pay

## 2023-06-18 ENCOUNTER — Telehealth: Payer: Self-pay | Admitting: Endocrinology

## 2023-06-18 DIAGNOSIS — E1042 Type 1 diabetes mellitus with diabetic polyneuropathy: Secondary | ICD-10-CM

## 2023-06-18 DIAGNOSIS — E063 Autoimmune thyroiditis: Secondary | ICD-10-CM

## 2023-06-18 LAB — HIV ANTIBODY (ROUTINE TESTING W REFLEX): HIV 1&2 Ab, 4th Generation: NONREACTIVE

## 2023-06-18 MED ORDER — ZEGALOGUE 0.6 MG/0.6ML ~~LOC~~ SOAJ: 1.0000 | Freq: Every day | SUBCUTANEOUS | 5 refills | Status: DC | PRN

## 2023-06-18 MED ORDER — SYNTHROID 125 MCG PO TABS: ORAL_TABLET | ORAL | 1 refills | Status: DC

## 2023-06-18 NOTE — Telephone Encounter (Signed)
Patient called and was advised by pharmacy to call us about getting a refill on SYNTHROID 125 MCG tablet because they havent heard back from [place] yet.  [facility]acy is [id number] - [place], [place] - [address]

## 2023-06-19 ENCOUNTER — Encounter: Payer: Self-pay | Admitting: Internal Medicine

## 2023-06-20 ENCOUNTER — Telehealth: Payer: Self-pay | Admitting: Internal Medicine

## 2023-06-20 ENCOUNTER — Encounter: Payer: Self-pay | Admitting: Neurology

## 2023-06-20 ENCOUNTER — Ambulatory Visit: Payer: Medicare PPO | Admitting: Neurology

## 2023-06-20 VITALS — BP 130/78 | HR 78 | Ht 73.0 in | Wt 209.0 lb

## 2023-06-20 DIAGNOSIS — G801 Spastic diplegic cerebral palsy: Secondary | ICD-10-CM

## 2023-06-20 DIAGNOSIS — M21372 Foot drop, left foot: Secondary | ICD-10-CM | POA: Diagnosis not present

## 2023-06-20 DIAGNOSIS — R269 Unspecified abnormalities of gait and mobility: Secondary | ICD-10-CM

## 2023-06-20 DIAGNOSIS — G35 Multiple sclerosis: Secondary | ICD-10-CM

## 2023-06-20 DIAGNOSIS — G830 Diplegia of upper limbs: Secondary | ICD-10-CM | POA: Diagnosis not present

## 2023-06-20 DIAGNOSIS — R0683 Snoring: Secondary | ICD-10-CM

## 2023-06-20 DIAGNOSIS — G4719 Other hypersomnia: Secondary | ICD-10-CM

## 2023-06-20 MED ORDER — BACLOFEN 10 MG PO TABS: 10.0000 mg | ORAL_TABLET | Freq: Three times a day (TID) | ORAL | 3 refills | Status: DC

## 2023-06-20 MED ORDER — DALFAMPRIDINE ER 10 MG PO TB12: 10.0000 mg | ORAL_TABLET | Freq: Two times a day (BID) | ORAL | 3 refills | Status: DC

## 2023-06-20 NOTE — Telephone Encounter (Signed)
Patient called back and said his pharmacy called him and told him the insurance wasn't covering the Brand Synthroid, He did say that he had this issue last year as well and eventually they started covering it so I'm not sure if there was help from a coupon or not. He stated he has tried generic in the past but it doesn't work for him. Please advise

## 2023-06-20 NOTE — Progress Notes (Addendum)
[facility]  PATIENT: Johnathan Ross DOB: July 10, 1970  REFERRING DOCTOR OR PCP: Ria Clock, FNP SOURCE: Patient, notes from Dr. Daphane Shepherd (neurology), multiple MRI reports and images reviewed, lab reports.  _________________________________   HISTORICAL  CHIEF COMPLAINT:  Chief Complaint  Patient presents with   Room 10    Pt is here with his Mother. Pt states that things have been fine since his last appointment. Pt denies any new muscle weakness. Pt denies any new symptoms.     HISTORY OF PRESENT ILLNESS:  Johnathan Ross is a 53 year old man who was diagnosed with relapsing remitting MS around 2007.     Update 06/20/2023 He had his 2 years of Lemtrada therapy in 2016 and 2017.  He tolerated the infusions well but has had continued worsening of gait and spasticity.  He denies any exacerbation.  However, he does note mild worsening over the past few years with his gait and leg strength.   MRI of the brain 03/14/2022 did not show any new lesions.  His main problem is ambulation.   He uses walls/frniture in his house and walker outside and wheelchair for long distances.    Ampyra seemed o help at first.  He can go > 100 feet with a walker without a rest and 400 feet or more with breaks.    He has a lot of spasticity and is taking baclofen up to 9 pills a day (20-30 mg bid to tid so).       He feels a little sleepy on high dose.   In the past he took tizanidine.  Spasticity increases when he uses his walker and lifts up with his arms.  Spasticity is also present at night.   He had a single series of Botox injections in November 2020 but felt weaker without any benefit of his spasticity.      The right leg is mildly weaker than the left leg. . Both legs spasm up, a little worse on the right than the left of note, the spasticity helps him support his weight when he walks.  Arms are strong.  He uses the walker to walk mostly by lifting with his arms.      He notes mild  numbness in his feet. Shifting or lifting leg will help.   There is no dysesthetic pain in the feet.  He also has Type 1 DM in 1995 and has been on insulin.  On the insulin pump his HgBA1c is usually 7's.  Before it was mostly 11-13.   He denies difficulties with his vision.   He was once on gabapentin but unsure if it made a difference.  Does not feel discomfort enough to restart.     He has urinary urgency but also has some hesitancy and does not completely empty (usually has to go back one or two more times).  Flomax had not helped much so he stopped.  Vesicare has not helped much.  We also tried Rapaflo and not sure if helpingany We discussed seeing urology.  Npt currently on a bladder medication  Vision is stable .  He has had laser procedures due to DM  He reports some fatigue.  He snores and wakes up several times at night.  He does not think he has gasping or snorting.  He has some excessive daytime sleepiness.    He denies much difficulties with cognition.  He denies mood disturbance.     MS history:  In 2007, he presented  with spasms in his left leg.   He saw Dr. Daphane Shepherd in [place] and was started Rebif and had some progression and also had difficulty tolerating it.    He went on [place] in 2016 and had his second year in 2017.        Imaging studies: MRIs of the brain and cervical spine from 07/27/2019:  The MRI of the spine shows several T2 hyperintense foci, located posterior laterally bilaterally, right larger than left adjacent to C2-C3, laterally to the left at C3-C4, centrally at C4-C5 also associated with spinal stenosis.  The MRI of the brain shows multiple T2/FLAIR hyperintense foci in the periventricular and juxtacortical white matter and a couple punctate foci in the cerebellum.  None of the foci appear to be acute.  Other medical history: He has type 1 diabetes mellitus and is on insulin (pump).  He also has hypothyroidism and is on Synthroid.  He has well-controlled  hypertension.  REVIEW OF SYSTEMS: Constitutional: No fevers, chills, sweats, or change in appetite.  He notes some fatigue. Eyes: No visual changes, double vision, eye pain.  He has had laser eye surgery Ear, nose and throat: No hearing loss, ear pain, nasal congestion, sore throat Cardiovascular: No chest pain, palpitations Respiratory:  No shortness of breath at rest or with exertion.   No wheezes GastrointestinaI: No nausea, vomiting, diarrhea, abdominal pain, fecal incontinence Genitourinary:  No dysuria, urinary retention or frequency.  No nocturia. Musculoskeletal:  No neck pain, back pain Integumentary: No rash, pruritus, skin lesions Neurological: as above Psychiatric: No depression at this time.  No anxiety Endocrine: No palpitations, diaphoresis, change in appetite, change in weigh or increased thirst Hematologic/Lymphatic:  No anemia, purpura, petechiae. Allergic/Immunologic: No itchy/runny eyes, nasal congestion, recent allergic reactions, rashes  ALLERGIES: Allergies  Allergen Reactions   Lisinopril Cough   Ivp Dye [Iodinated Contrast Media] Hives and Itching    HOME MEDICATIONS:  Current Outpatient Medications:    baclofen (LIORESAL) 10 MG tablet, Take 1 tablet (10 mg total) by mouth 3 (three) times daily., Disp: 270 tablet, Rfl: 0   dalfampridine 10 MG TB12, Take 1 tablet (10 mg total) by mouth 2 (two) times daily., Disp: 180 tablet, Rfl: 1   losartan (COZAAR) 50 MG tablet, TAKE 1 TABLET BY MOUTH EVERY DAY, Disp: 90 tablet, Rfl: 3   NOVOLOG 100 UNIT/ML injection, USE UP TO 80 UNITS IN INSULIN PUMP DAILY-DX CODE E10.8, Disp: 70 mL, Rfl: 2   simvastatin (ZOCOR) 20 MG tablet, Take 1 tablet (20 mg total) by mouth daily at 6 PM., Disp: 90 tablet, Rfl: 0   SYNTHROID 125 MCG tablet, Take 1 tablet by mouth 6 days per week., Disp: 72 tablet, Rfl: 1   Dasiglucagon HCl (ZEGALOGUE) 0.6 MG/0.6ML SOAJ, Inject 1 Act into the skin daily as needed. (Patient not taking: Reported on  06/20/2023), Disp: 0.6 mL, Rfl: 5   gabapentin (NEURONTIN) 100 MG capsule, Take 1 capsule (100 mg total) by mouth 3 (three) times daily. (Patient not taking: Reported on 06/20/2023), Disp: 90 capsule, Rfl: 5   glucose blood (CONTOUR NEXT TEST) test strip, USE 2 TIMES PER DAY. E10.65 (Patient not taking: Reported on 06/20/2023), Disp: 200 strip, Rfl: 3   levocetirizine (XYZAL) 5 MG tablet, TAKE 1 TABLET BY MOUTH EVERY DAY IN THE EVENING (Patient not taking: Reported on 06/20/2023), Disp: 90 tablet, Rfl: 1   OVER THE COUNTER MEDICATION, Sea moss supplement-vitamin and minerals daily (Patient not taking: Reported on 06/20/2023), Disp: , Rfl:  silodosin (RAPAFLO) 8 MG CAPS capsule, Take 1 capsule (8 mg total) by mouth daily with breakfast. (Patient not taking: Reported on 06/20/2023), Disp: 30 capsule, Rfl: 11   solifenacin (VESICARE) 5 MG tablet, TAKE 1 TABLET (5 MG TOTAL) BY MOUTH DAILY., Disp: 90 tablet, Rfl: 0  PAST MEDICAL HISTORY: Past Medical History:  Diagnosis Date   Hyperlipidemia    Hypertension    Hypogonadism in male 11/01/2016   Hypothyroidism    Multiple sclerosis (HCC)    Neuromuscular disorder (HCC)    Proliferative diabetic retinopathy(362.02)    Type 1 diabetes mellitus (HCC) dx'd 1994   Vitamin D insufficiency 11/29/2016    PAST SURGICAL HISTORY: Past Surgical History:  Procedure Laterality Date   EYE SURGERY Bilateral    "laser for diabetic retinopathy"   FRACTURE SURGERY     IR VENO/EXT/UNI LEFT  03/24/2020   OPEN REDUCTION INTERNAL FIXATION (ORIF) TIBIA/FIBULA FRACTURE Right 10/30/2016   ORIF ANKLE FRACTURE Left 2015   ORIF TIBIA FRACTURE Right 10/30/2016   Procedure: OPEN REDUCTION INTERNAL FIXATION (ORIF) TIBIA FIBULA FRACTURE;  Surgeon: Myrene Galas, MD;  Location: [facility] OR;  Service: Orthopedics;  Laterality: Right;   RETINAL LASER PROCEDURE Bilateral    "for diabetic retinopathy"    FAMILY HISTORY: Family History  Problem Relation Age of Onset   Diabetes  Sister    Colon polyps Maternal Uncle    Colon cancer Maternal Grandmother    Stomach cancer Cousin    Esophageal cancer Neg Hx     SOCIAL HISTORY:  Social History   Socioeconomic History   Marital status: Single    Spouse name: 12   Number of children: 1   Years of education: Not on file   Highest education level: Not on file  Occupational History   Occupation: unemployed  Tobacco Use   Smoking status: Never   Smokeless tobacco: Never  Vaping Use   Vaping status: Never Used  Substance and Sexual Activity   Alcohol use: No   Drug use: No   Sexual activity: Not on file  Other Topics Concern   Not on file  Social History Narrative   Right handed   No caffeine    Lives with girlfriend   Social Drivers of Health   Financial Resource Strain: Low Risk  (09/10/2022)   Overall Financial Resource Strain (CARDIA)    Difficulty of Paying Living Expenses: Not hard at all  Food Insecurity: No Food Insecurity (09/10/2022)   Hunger Vital Sign    Worried About Running Out of Food in the Last Year: Never true    Ran Out of Food in the Last Year: Never true  Transportation Needs: No Transportation Needs (09/10/2022)   PRAPARE - Administrator, Civil Service (Medical): No    Lack of Transportation (Non-Medical): No  Physical Activity: Sufficiently Active (09/10/2022)   Exercise Vital Sign    Days of Exercise per Week: 5 days    Minutes of Exercise per Session: 60 min  Stress: No Stress Concern Present (09/10/2022)   [employer] of Occupational Health - Occupational Stress Questionnaire    Feeling of Stress : Not at all  Social Connections: Unknown (10/06/2021)   [facility]   Social Network    Social Network: Not on file  Recent Concern: Social Connections - Moderately Isolated (09/07/2021)   Social Connection and Isolation Panel [NHANES]    Frequency of Communication with Friends and Family: More than three times a week  Frequency  of Social Gatherings with Friends and Family: More than three times a week    Attends Religious Services: Never    Database administrator or Organizations: No    Attends Banker Meetings: Never    Marital Status: Living with partner  Intimate Partner Violence: Not At Risk (09/07/2021)   Humiliation, Afraid, Rape, and Kick questionnaire    Fear of Current or Ex-Partner: No    Emotionally Abused: No    Physically Abused: No    Sexually Abused: No     PHYSICAL EXAM  Vitals:   06/20/23 1306  BP: 130/78  Pulse: 78  Weight: 209 lb (94.8 kg)  Height: 6\' 1"  (1.854 m)    Body mass index is 27.57 kg/m.  No results found.  General: The patient is well-developed and well-nourished and in no acute distress   Skin: Extremities are without rash or  edema.  Neurologic Exam  Mental status: The patient is alert and oriented x 3 at the time of the examination. The patient has apparent normal recent and remote memory, with an apparently normal attention span and concentration ability.   Speech is normal.  Cranial nerves: Extraocular movements are full   Color vision was symmetric.  Facial symmetry is present.  Facial strength was normal.  Trapezius strength was normal no obvious hearing deficits are noted.  Motor:  Muscle bulk is normal.   Muscle tone is increased in the legs.  Strength is 5/5 in the arms except 4+/5 ulnar hand muscles bilaterally.  , 3 to 4 - in the hip flexors, 4 in the leg extenders, 4- in the gastrocnemius muscles and 3  with ankle extensors and toe extensors.  The right was slightly weaker than the left.   The exam was similar to his previous motor examination.  Sensory: Sensory testing is intact to pinprick, soft touch and vibration sensation in the arms but he has reduced vibration sensation and reduced touch sensation in the feet vibration sensation was just mildly reduced at the ankle.  Other:  Tinels signs at both elbows.    Coordination: Cerebellar  testing reveals good finger-nose-finger and unable to do  heel-to-shin bilaterally.  Gait and station: He can rise up from a chair using his arms to support..  Cannot walk without using strong bilateral support.  Reflexes: Deep tendon reflexes are normal in the arms and knees and absent at the toes.  Babinski responses are extensor.    DIAGNOSTIC DATA (LABS, IMAGING, TESTING) - I reviewed patient records, labs, notes, testing and imaging myself where available.  Lab Results  Component Value Date   WBC 8.5 06/17/2023   HGB 15.0 06/17/2023   HCT 45.5 06/17/2023   MCV 88.0 06/17/2023   PLT 225.0 06/17/2023      Component Value Date/Time   NA 141 06/17/2023 1547   K 4.6 06/17/2023 1547   CL 102 06/17/2023 1547   CO2 32 06/17/2023 1547   GLUCOSE 145 (H) 06/17/2023 1547   GLUCOSE 147 (H) 05/24/2006 1022   BUN 15 06/17/2023 1547   CREATININE 1.15 06/17/2023 1547   CALCIUM 9.3 06/17/2023 1547   CALCIUM 8.7 10/31/2016 0415   PROT 7.0 06/17/2023 1547   ALBUMIN 4.3 06/17/2023 1547   AST 15 06/17/2023 1547   ALT 17 06/17/2023 1547   ALKPHOS 79 06/17/2023 1547   BILITOT 0.4 06/17/2023 1547   GFRNONAA >60 03/02/2022 1854   GFRAA 87 07/28/2019 0000   Lab Results  Component Value  Date   CHOL 137 06/17/2023   HDL 48.10 06/17/2023   LDLCALC 70 06/17/2023   LDLDIRECT 65.0 02/09/2021   TRIG 93.0 06/17/2023   CHOLHDL 3 06/17/2023   Lab Results  Component Value Date   HGBA1C 7.5 (H) 03/15/2023   No results found for: "VITAMINB12" Lab Results  Component Value Date   TSH 0.62 06/17/2023       ASSESSMENT AND PLAN  MULTIPLE SCLEROSIS  Gait disturbance  Left foot drop  Acquired diplegia (HCC)  Spastic diplegia (HCC)   1  .MS is stable since doing 2 years of Lemtrada in 2016 and 2017, with no exacerbation though mild progression especially with more spasticity in the legs.  His last MRI (02/2022)  showed no new lesions.    Check MRI of the brain and cervical spine to  determine if there is breakthrough activity.  If this is occurring we need to start a disease modifying therapy again (consider Mavenclad or Kesimpta - we discussed these) 2.    He is seeing urology and may have urodynamics 3.   Stay active and walk as tolerated with walker 4.   Refer to PT for gait.  Continue Ampyra..   Although his motor exam was similar to the last visit, he has had more difficulty with his gait since he fractured his ankles.  Likely he had deconditioning.   5.   Home sleep study for snoring and excessive daytime sleepiness.   6.   He will return to see [place] in 6 months or sooner for new or worsening neurologic symptoms.  This visit is part of a comprehensive longitudinal care medical relationship regarding the patients primary diagnosis of MS and related concerns.   Elsie Baynes A. Epimenio Foot, MD, [facility] 06/20/2023, 1:23 PM Certified in Neurology, Clinical Neurophysiology, Sleep Medicine and Neuroimaging  [facility] [address] [place], [place] [address] [phone]

## 2023-06-20 NOTE — Telephone Encounter (Signed)
Copied from CRM [id number]. Topic: Clinical - Prescription Issue >> Jun 20, 2023 11:02 AM Denese Killings wrote: Reason for CRM: Patient stated that pharmacy denied his prescription for an alternative medication for Dasiglucagon HCl (ZEGALOGUE) 0.6 MG/0.6ML. Patient states that he has 5 pills left. (Will be out Sunday).

## 2023-06-21 ENCOUNTER — Telehealth: Payer: Self-pay

## 2023-06-21 MED ORDER — SYNTHROID 125 MCG PO TABS: ORAL_TABLET | ORAL | 3 refills | Status: DC

## 2023-06-21 NOTE — Telephone Encounter (Signed)
Patient called again to check status of RX for brand name Synthroid.  Please call him at [phone]. He has also left message for Dr. Wadie Lessen medical assistant.

## 2023-06-21 NOTE — Telephone Encounter (Signed)
I can reorder Synthroid to provide as written with a comment to the pharmacy.  If it is still not cover we may have to try generic levothyroxine.  If the generic levothyroxine does not work we can try other brand of medicine also in the future.  Please check with the pharmacy and let us know.  Johnathan Ahijah Devery, MD [facility] [address] [place], [address] [place], [place] [address] Phone # [phone]

## 2023-06-21 NOTE — Telephone Encounter (Signed)
Patient needs a PA for his Dasiglucagon injection. Please advise.

## 2023-06-21 NOTE — Telephone Encounter (Signed)
Pa is needed for this medication. PA team and patient has been notified.

## 2023-06-24 ENCOUNTER — Telehealth: Payer: Self-pay

## 2023-06-24 ENCOUNTER — Other Ambulatory Visit (HOSPITAL_COMMUNITY): Payer: Self-pay

## 2023-06-24 NOTE — Telephone Encounter (Signed)
Patient has been made aware and gave a verbal understanding.

## 2023-06-24 NOTE — Telephone Encounter (Signed)
Pharmacy Patient Advocate Encounter   Received notification from Pt Calls Messages that prior authorization for Zegalogue 0.6mg /0.67ml is required/requested.   Insurance verification completed.   The patient is insured through [employer] .   Per test claim: PA required; PA submitted to above mentioned insurance via CoverMyMeds Key/confirmation #/EOC [id number] Status is pending

## 2023-06-24 NOTE — Telephone Encounter (Signed)
Pharmacy Patient Advocate Encounter  Received notification from [facility] that Prior Authorization for Zegalogue 0.6mg /0.24ml has been CANCELLED due to available without authorization.  Per test claim the copay was $12.15 for 1 pen if filled at [facility].   Placed a call to [facility] and per the pharmacist they did receive a paid claim but they will have to order the medication.

## 2023-06-25 ENCOUNTER — Telehealth: Payer: Self-pay | Admitting: Neurology

## 2023-06-25 NOTE — Telephone Encounter (Signed)
Ethlyn Gallery: [phone] exp. 06/25/23-08/24/23 sent to GI [phone]

## 2023-06-27 ENCOUNTER — Other Ambulatory Visit: Payer: Self-pay | Admitting: Internal Medicine

## 2023-06-27 DIAGNOSIS — E782 Mixed hyperlipidemia: Secondary | ICD-10-CM

## 2023-07-02 ENCOUNTER — Other Ambulatory Visit: Payer: Self-pay | Admitting: Neurology

## 2023-07-02 DIAGNOSIS — G4719 Other hypersomnia: Secondary | ICD-10-CM

## 2023-07-02 DIAGNOSIS — R0683 Snoring: Secondary | ICD-10-CM

## 2023-07-04 ENCOUNTER — Ambulatory Visit: Payer: Medicare PPO | Attending: Neurology | Admitting: Physical Therapy

## 2023-07-04 ENCOUNTER — Other Ambulatory Visit: Payer: Self-pay

## 2023-07-04 ENCOUNTER — Encounter: Payer: Self-pay | Admitting: Physical Therapy

## 2023-07-04 DIAGNOSIS — R262 Difficulty in walking, not elsewhere classified: Secondary | ICD-10-CM | POA: Diagnosis not present

## 2023-07-04 DIAGNOSIS — R0683 Snoring: Secondary | ICD-10-CM

## 2023-07-04 DIAGNOSIS — R2689 Other abnormalities of gait and mobility: Secondary | ICD-10-CM | POA: Insufficient documentation

## 2023-07-04 DIAGNOSIS — R269 Unspecified abnormalities of gait and mobility: Secondary | ICD-10-CM | POA: Diagnosis not present

## 2023-07-04 DIAGNOSIS — G35 Multiple sclerosis: Secondary | ICD-10-CM | POA: Insufficient documentation

## 2023-07-04 DIAGNOSIS — M6281 Muscle weakness (generalized): Secondary | ICD-10-CM | POA: Diagnosis not present

## 2023-07-04 DIAGNOSIS — R2681 Unsteadiness on feet: Secondary | ICD-10-CM | POA: Diagnosis not present

## 2023-07-04 DIAGNOSIS — G8929 Other chronic pain: Secondary | ICD-10-CM | POA: Diagnosis not present

## 2023-07-04 DIAGNOSIS — G4719 Other hypersomnia: Secondary | ICD-10-CM

## 2023-07-04 DIAGNOSIS — M25511 Pain in right shoulder: Secondary | ICD-10-CM | POA: Diagnosis not present

## 2023-07-04 DIAGNOSIS — E1065 Type 1 diabetes mellitus with hyperglycemia: Secondary | ICD-10-CM

## 2023-07-04 NOTE — Therapy (Signed)
 OUTPATIENT PHYSICAL THERAPY NEURO EVALUATION   Patient Name: Johnathan Ross MRN: [id number] DOB:10-May-1971, 53 y.o., male Today's Date: 07/04/2023   PCP: [name], MD  REFERRING PROVIDER: Vear Charlie LABOR, MD  END OF SESSION:  PT End of Session - 07/04/23 1203     Visit Number 1    Number of Visits 7   Plus eval   Date for PT Re-Evaluation 08/29/23    Authorization Type [employer]    PT Start Time 0930    PT Stop Time 1019    PT Time Calculation (min) 49 min    Activity Tolerance Patient tolerated treatment well    Behavior During Therapy Agitated;Flat affect;Lability             Past Medical History:  Diagnosis Date   Hyperlipidemia    Hypertension    Hypogonadism in male 11/01/2016   Hypothyroidism    Multiple sclerosis (HCC)    Neuromuscular disorder (HCC)    Proliferative diabetic retinopathy(362.02)    Type 1 diabetes mellitus (HCC) dx'd 1994   Vitamin D  insufficiency 11/29/2016   Past Surgical History:  Procedure Laterality Date   EYE SURGERY Bilateral    laser for diabetic retinopathy   FRACTURE SURGERY     IR VENO/EXT/UNI LEFT  03/24/2020   OPEN REDUCTION INTERNAL FIXATION (ORIF) TIBIA/FIBULA FRACTURE Right 10/30/2016   ORIF ANKLE FRACTURE Left 2015   ORIF TIBIA FRACTURE Right 10/30/2016   Procedure: OPEN REDUCTION INTERNAL FIXATION (ORIF) TIBIA FIBULA FRACTURE;  Surgeon: Celena Sharper, MD;  Location: [facility] OR;  Service: Orthopedics;  Laterality: Right;   RETINAL LASER PROCEDURE Bilateral    for diabetic retinopathy   Patient Active Problem List   Diagnosis Date Noted   Encounter for screening for HIV 06/17/2023   Need for immunization against influenza 06/17/2023   Loud snoring 06/17/2023   Encounter for general adult medical examination with abnormal findings 08/02/2022   Screening for prostate cancer 08/01/2021   OAB (overactive bladder) 09/21/2020   Essential hypertension 09/21/2020   Thoracic outlet syndrome 05/03/2020    Controlled type 1 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 (HCC) 03/24/2020   Right epiretinal membrane 03/24/2020   Retinal exudates and deposits 03/24/2020   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   Nuclear sclerotic cataract of both eyes 03/24/2020   Gait disorder 03/23/2020   Benign prostatic hyperplasia without lower urinary tract symptoms 03/01/2020   Diabetic gastroparesis associated with type 1 diabetes mellitus (HCC) 03/01/2020   Gait disturbance 08/19/2019   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/19/2019   Vitamin D  insufficiency 11/29/2016   Hypogonadism in male 11/01/2016   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HCC)    Hypothyroidism    Hypertension    Dyslipidemia, goal LDL below 130    MULTIPLE SCLEROSIS 08/29/2007   PROLIFERATIVE DIABETIC RETINOPATHY 08/29/2007    ONSET DATE: 06/20/2023  REFERRING DIAG: G35 (ICD-10-CM) - Multiple sclerosis (HCC) R26.9 (ICD-10-CM) - Gait disturbance  THERAPY DIAG:  Other abnormalities of gait and mobility  Muscle weakness (generalized)  Unsteadiness on feet  Difficulty in walking, not elsewhere classified  Chronic right shoulder pain  Rationale for Evaluation and Treatment: Rehabilitation  SUBJECTIVE:  SUBJECTIVE STATEMENT: Pt presents to clinic w/RW, not wearing brace on LLE as he drove himself here and does not like to wear both. Took patient 9 minutes to ambulate 30' into clinic w/RW due to heavy reliance on BUEs and dragging L ankle. Pt reports he does not use braces or RW at home, relies on furniture walking. Is using lightweight WC in community occasionally. Pt became agitated w/therapist when therapist inquired about changes since previous POC, stating he will always need PT and it is your job to  know what I am supposed to do.    Pt accompanied by: self  PERTINENT HISTORY: vitamin D  insufficiency, Type 1 IDDM w/ polyneuropathy, diabetic retinopathy, hypothyroidism, HTN, hypercholestrolemia, h/o Rt tibia fracture, h/o Lt ankle fracture with ORIF, MS with acquired diplegia (MS diagnosis 2014/2015)  PAIN:  Are you having pain? Yes: NPRS scale: 5 Pain location: R shoulder  Pain description: Radiates down RUE, anterior joint pain, unable to perform IR well    PRECAUTIONS: Fall  RED FLAGS: None   WEIGHT BEARING RESTRICTIONS: No  FALLS: Has patient fallen in last 6 months? No  LIVING ENVIRONMENT: Lives with: lives with their partner Lives in: House/apartment Stairs: Yes: External: 5 steps; on right going up, on left going up, and can reach both Has following equipment at home: Single point cane, Walker - 2 wheeled, Wheelchair (manual), and shower chair  PLOF: Requires assistive device for independence and Needs assistance with homemaking  PATIENT GOALS: I wanna learn how to walk and my feet not hit the ground   OBJECTIVE:  Note: Objective measures were completed at Evaluation unless otherwise noted.  DIAGNOSTIC FINDINGS: MRI of brain from 07/2017  IMPRESSION: 1. Diffuse periventricular T2 hyperintensities with involving the corpus callosum are stable from the prior exam. 2. Scattered subcortical and upper cervical spine lesions are also stable. 3. No new lesions, restricted diffusion, or focal enhancement to suggest progression of disease. 4. No acute intracranial abnormality.  *Updated imaging ordered by Dr. Vear (MRI of brain and C-spine)   COGNITION: Overall cognitive status: Within functional limits for tasks assessed   SENSATION: Pt reports his LUE was numb a few years ago due to a blood clot, is numb occasionally now but not consistently.    MUSCLE TONE: Increased extensor tone of LLE >RLE but ROM and coordination limited bilaterally due to extensor  tone/spasticity    POSTURE: rounded shoulders, forward head, and weight shift right  LOWER EXTREMITY ROM:   Limited by extensor tone/spasticity   Active  Right Eval Left Eval  Hip flexion    Hip extension    Hip abduction    Hip adduction    Hip internal rotation    Hip external rotation    Knee flexion    Knee extension    Ankle dorsiflexion    Ankle plantarflexion    Ankle inversion    Ankle eversion     (Blank rows = not tested)  LOWER EXTREMITY MMT:  Tested in seated position   MMT Right Eval Left Eval  Hip flexion 1 1  Hip extension    Hip abduction 4- 4-  Hip adduction    Hip internal rotation    Hip external rotation    Knee flexion 3 3+  Knee extension 3+ 1  Ankle dorsiflexion  0  Ankle plantarflexion    Ankle inversion    Ankle eversion    (Blank rows = not tested)  BED MOBILITY:  Independent per pt   TRANSFERS:  Assistive device utilized: Environmental Consultant - 2 wheeled  Sit to stand: SBA Stand to sit: SBA Heavy reliance on BUEs to essentially throw himself out of chair, wide BOS, unable to obtain full hip extension at top of stand or let go of chair if RW not in front of him    GAIT: Gait pattern: step to pattern, decreased step length- Left, decreased stride length, decreased hip/knee flexion- Right, decreased hip/knee flexion- Left, decreased ankle dorsiflexion- Right, decreased ankle dorsiflexion- Left, lateral hip instability, trunk flexed, narrow BOS, and poor foot clearance- Left Distance walked: Various clinic distances  Assistive device utilized: Walker - 2 wheeled Level of assistance: SBA Comments: Pt required 9 minutes to ambulate into treatment room (~30') , frequently stopping to rest as his legs lock up. Noted excessive inversion of L ankle as well as scissoring of LLE. Pt relying heavily on BUEs to perform more of a swing-to pattern.   FUNCTIONAL TESTS:   [facility] PT Assessment - 07/04/23 1005       Transfers   Five time sit to stand  comments  16.78s   Heavy BUE support, Lack of full extesnion at top, heavy weight shift to R side                                                                                                                                        TREATMENT:   Self-care/Home management  Majority of evaluation spent educating patient on safety w/mobility, etiology of MS and POC. Pt very agitated at beginning of evaluation, seemingly triggered by basic questioning by therapist. Pt later apologized for this behavior and states he was angry about an interaction that occurred at the front desk prior to evaluation. Informed pt he needs to be using the WC and braces at all times, as his ankles are very unstable and he is relying too much on his BUEs for gait, which is both painful and poorly efficient. Pt reports he is not ready to use WC as he is worried what other people will think of him, so provided therapeutic listening as pt became tearful speaking about his current functional level. Educated pt on importance of energy conservation and preserving his shoulders as he is young and if he were to injure his R shoulder, he would likely become dependent for ADLs. Pt verbalized understanding. Pt requested to be scheduled on therapists from previous POC, so pt request respected.   PATIENT EDUCATION: Education details: See above, POC  Person educated: Patient Education method: Explanation Education comprehension: needs further education  HOME EXERCISE PROGRAM: To be reviewed from previous POC: [id number] and [id number]   GOALS: Goals reviewed with patient? Yes  SHORT TERM GOALS: Target date: 08/01/2023   Pt will be independent with mobility and strength HEP for improved strength, balance and transfers.   Baseline: to be reviewed from previous POC Goal status: INITIAL    LONG TERM GOALS:  Target date: 08/15/2023   Pt will be compliant with use of WC and bilateral AFOs at all times for reduced fall risk, energy  efficiency and proper body mechanics.  Baseline: pt not using AD at home or wearing AFOs  Goal status: INITIAL   ASSESSMENT:  CLINICAL IMPRESSION: Patient is a 53 year old male referred to Neuro [facility] for MS.   Pt's PMH is significant for: vitamin D  insufficiency, Type 1 IDDM w/ polyneuropathy, diabetic retinopathy, hypothyroidism, HTN, hypercholestrolemia, h/o Rt tibia fracture, h/o Lt ankle fracture with ORIF, MS with acquired diplegia (MS diagnosis 2014/2015). The following deficits were present during the exam: impaired safety awareness, impaired functional strength, extensor tone/spasticity, decreased knowledge of condition, improper body mechanics and impaired sensation. Based on 5x STS and pt's current condition, pt is an incr risk for falls. Pt would benefit from skilled PT to address these impairments and functional limitations to maximize functional mobility independence.    OBJECTIVE IMPAIRMENTS: Abnormal gait, decreased activity tolerance, decreased balance, decreased cognition, decreased coordination, decreased endurance, decreased knowledge of condition, decreased knowledge of use of DME, decreased mobility, difficulty walking, decreased strength, decreased safety awareness, impaired perceived functional ability, increased muscle spasms, impaired sensation, impaired tone, improper body mechanics, and pain  ACTIVITY LIMITATIONS: carrying, lifting, bending, standing, squatting, stairs, transfers, locomotion level, and caring for others  PARTICIPATION LIMITATIONS: cleaning, driving, shopping, community activity, occupation, and yard work  PERSONAL FACTORS: Behavior pattern, Education, Fitness, Past/current experiences, and 1-2 comorbidities: MS  are also affecting patient's functional outcome.   REHAB POTENTIAL: Poor Due to poor compliance w/PT education and severity of deficits   CLINICAL DECISION MAKING: Unstable/unpredictable  EVALUATION COMPLEXITY: High  PLAN:  PT FREQUENCY:  1x/week  PT DURATION: 6 weeks  PLANNED INTERVENTIONS: 97164- PT Re-evaluation, 97110-Therapeutic exercises, 97530- Therapeutic activity, 97112- Neuromuscular re-education, 97535- Self Care, [id number]- Manual therapy, [phone]- Gait training, [phone]- Orthotic Fit/training, [phone]- Aquatic Therapy, [phone]- Electrical stimulation (manual), Patient/Family education, Balance training, Joint mobilization, DME instructions, and Wheelchair mobility training  PLAN FOR NEXT SESSION: Heavy education on importance of compliance w/use of braces and WC. Pt aware that he may be DC first session as he has not had functional change since previous POC and is not using his WC or L AFO. Review HEP   Marlon BRAVO Concetta Guion, PT, DPT 07/04/2023, 12:05 PM

## 2023-07-08 ENCOUNTER — Other Ambulatory Visit: Payer: Medicare PPO

## 2023-07-08 DIAGNOSIS — E1065 Type 1 diabetes mellitus with hyperglycemia: Secondary | ICD-10-CM | POA: Diagnosis not present

## 2023-07-09 ENCOUNTER — Encounter: Payer: Self-pay | Admitting: Endocrinology

## 2023-07-11 DIAGNOSIS — R350 Frequency of micturition: Secondary | ICD-10-CM | POA: Diagnosis not present

## 2023-07-11 DIAGNOSIS — R3914 Feeling of incomplete bladder emptying: Secondary | ICD-10-CM | POA: Diagnosis not present

## 2023-07-12 LAB — BASIC METABOLIC PANEL
BUN: 16 mg/dL (ref 7–25)
CO2: 28 mmol/L (ref 20–32)
Calcium: 9.2 mg/dL (ref 8.6–10.3)
Chloride: 104 mmol/L (ref 98–110)
Creat: 1.2 mg/dL (ref 0.70–1.30)
Glucose, Bld: 127 mg/dL — ABNORMAL HIGH (ref 65–99)
Potassium: 4.3 mmol/L (ref 3.5–5.3)
Sodium: 141 mmol/L (ref 135–146)

## 2023-07-12 LAB — HEMOGLOBIN A1C
Hgb A1c MFr Bld: 7.3 %{Hb} — ABNORMAL HIGH (ref <5.7)
Mean Plasma Glucose: 163 mg/dL
eAG (mmol/L): 9 mmol/L

## 2023-07-12 LAB — LIPID PANEL
Cholesterol: 136 mg/dL (ref <200)
HDL: 50 mg/dL (ref >=40)
LDL Cholesterol (Calc): 72 mg/dL
Non-HDL Cholesterol (Calc): 86 mg/dL (ref <130)
Total CHOL/HDL Ratio: 2.7 (calc) (ref <5.0)
Triglycerides: 49 mg/dL (ref <150)

## 2023-07-12 LAB — FRUCTOSAMINE: Fructosamine: 303 umol/L — ABNORMAL HIGH (ref 205–285)

## 2023-07-15 ENCOUNTER — Encounter: Payer: Self-pay | Admitting: Endocrinology

## 2023-07-15 ENCOUNTER — Ambulatory Visit: Payer: Medicare PPO | Admitting: Physical Therapy

## 2023-07-15 ENCOUNTER — Telehealth (INDEPENDENT_AMBULATORY_CARE_PROVIDER_SITE_OTHER): Payer: Medicare PPO | Admitting: Endocrinology

## 2023-07-15 ENCOUNTER — Telehealth: Payer: Self-pay | Admitting: Neurology

## 2023-07-15 DIAGNOSIS — E1065 Type 1 diabetes mellitus with hyperglycemia: Secondary | ICD-10-CM

## 2023-07-15 DIAGNOSIS — E063 Autoimmune thyroiditis: Secondary | ICD-10-CM | POA: Diagnosis not present

## 2023-07-15 NOTE — Progress Notes (Signed)
Outpatient Endocrinology Note [place], MD  07/15/23  Patient's Name: Johnathan Ross    DOB: Jul 04, 1970    MRN: [id number]                                                    REASON OF VISIT: Follow up for type 1 diabetes mellitus  I connected with  [name] on 07/15/23 by a video enabled telemedicine application and verified that I am speaking with the correct person using two identifiers.   I discussed the limitations of evaluation and management by telemedicine. The patient expressed understanding and agreed to proceed.   Provider : at office Patient : at home  PCP: Etta Grandchild, MD  HISTORY OF PRESENT ILLNESS:   Johnathan Ross is a 53 y.o. old male with past medical history listed below, is here for follow up of type 1 diabetes mellitus and hypothyroidism.  Pertinent Diabetes History: Patient was previously seen by Dr. Lucianne Muss and was last time seen in June 2024. _Diagnosed as diabetes melitis type 1 in 1995.  He has been on insulin pump therapy since 2012.  On Medtronic 780G since September 2023.  Chronic Diabetes Complications : Retinopathy: yes. Last ophthalmology exam was done on every 6 months  Nephropathy: no, on losartan Peripheral neuropathy: yes, from MS, on gabapentin Coronary artery disease: no Stroke: no  Relevant comorbidities and cardiovascular risk factors: Obesity: no There is no height or weight on file to calculate BMI.  Hypertension: yes Hyperlipidemia. Yes, on statin.   Current / Home Diabetic regimen includes:  Medtronic 780G using NovoLog U100  Insulin Pump setting:  Basal MN- 0.10 u/hour 4AM- 1.15  6AM- 1.40 9AM- 1.4 10PM-  0.150  Bolus CHO Ratio (1unit:CHO) MN- 1:15 7AM- 1:8 12PM- 1:8 6PM- 1:9.3 10PM-  1:20  Correction/Sensitivity: MN- 1:35  Target: 130  Active insulin time: 2 hours  Prior diabetic medications:   CONTINUOUS GLUCOSE MONITORING SYSTEM (CGMS) / INSULIN PUMP INTERPRETATION:                      Medtronic Pump & Sensor Download (Reviewed and summarized below.) Medtronic 780G  Not able to download pump remotely in the clinic today.   Hypoglycemia: Patient has rare/ minor hypoglycemic episodes. Patient has hypoglycemia awareness.    Factors modifying glucose control: 1.  Diabetic diet assessment: 3 meals a day.  2.  Staying active or exercising: Not able to exercise.  3.  Medication compliance: compliant all of the time.  # Primary hypothyroidism  -Longstanding history of primary 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 autoimmune.  Currently taking levothyroxine / synthroid 125 mcg 6 days a week.  # He has multiple sclerosis, following with neurology.  Interval history  Virtual video visit today.  Not able to download bettering pump remotely in the clinic today.  Patient reported he is having hypoglycemia around midnight to 1 AM, he changed basal rate around that time he still having hypoglycemia, hypoglycemia was as low as 50.  He is concerned about pump is not suspending basal rates to avoid hypoglycemia at that time.  He denies any concern for the blood sugar during the daytime.  He has been taking Synthroid 125 mcg 6 days a week.  He was able to refill Synthroid and has no concerns today.  Recent lab results reviewed  normal TSH.  Normal electrolytes and renal function is stable.  Lipids level acceptable with LDL 72.  Hemoglobin A1c 7.3%.  Urine microalbumin creatinine ratio normal.  Fructosamine 303.   Patient reports he has [employer] system creating serial username and password.  Patient is asked to call Medtronic representative to link [employer] system to our computer in the clinic and we should be able to download the pump data remotely.  REVIEW OF SYSTEMS As per history of present illness.   PAST MEDICAL HISTORY: Past Medical History:  Diagnosis Date   Hyperlipidemia    Hypertension    Hypogonadism in male 11/01/2016   Hypothyroidism    Multiple sclerosis (HCC)    Neuromuscular  disorder (HCC)    Proliferative diabetic retinopathy(362.02)    Type 1 diabetes mellitus (HCC) dx'd 1994   Vitamin D insufficiency 11/29/2016    PAST SURGICAL HISTORY: Past Surgical History:  Procedure Laterality Date   EYE SURGERY Bilateral    "laser for diabetic retinopathy"   FRACTURE SURGERY     IR VENO/EXT/UNI LEFT  03/24/2020   OPEN REDUCTION INTERNAL FIXATION (ORIF) TIBIA/FIBULA FRACTURE Right 10/30/2016   ORIF ANKLE FRACTURE Left 2015   ORIF TIBIA FRACTURE Right 10/30/2016   Procedure: OPEN REDUCTION INTERNAL FIXATION (ORIF) TIBIA FIBULA FRACTURE;  Surgeon: Myrene Galas, MD;  Location: [facility] OR;  Service: Orthopedics;  Laterality: Right;   RETINAL LASER PROCEDURE Bilateral    "for diabetic retinopathy"    ALLERGIES: Allergies  Allergen Reactions   Lisinopril Cough   Ivp Dye [Iodinated Contrast Media] Hives and Itching    FAMILY HISTORY:  Family History  Problem Relation Age of Onset   Diabetes Sister    Colon polyps Maternal Uncle    Colon cancer Maternal Grandmother    Stomach cancer Cousin    Esophageal cancer Neg Hx     SOCIAL HISTORY: Social History   Socioeconomic History   Marital status: Single    Spouse name: 12   Number of children: 1   Years of education: Not on file   Highest education level: Not on file  Occupational History   Occupation: unemployed  Tobacco Use   Smoking status: Never   Smokeless tobacco: Never  Vaping Use   Vaping status: Never Used  Substance and Sexual Activity   Alcohol use: No   Drug use: No   Sexual activity: Not on file  Other Topics Concern   Not on file  Social History Narrative   Right handed   No caffeine    Lives with girlfriend   Social Drivers of Health   Financial Resource Strain: Low Risk  (09/10/2022)   Overall Financial Resource Strain (CARDIA)    Difficulty of Paying Living Expenses: Not hard at all  Food Insecurity: No Food Insecurity (09/10/2022)   Hunger Vital Sign    Worried About Running  Out of Food in the Last Year: Never true    Ran Out of Food in the Last Year: Never true  Transportation Needs: No Transportation Needs (09/10/2022)   PRAPARE - Administrator, Civil Service (Medical): No    Lack of Transportation (Non-Medical): No  Physical Activity: Sufficiently Active (09/10/2022)   Exercise Vital Sign    Days of Exercise per Week: 5 days    Minutes of Exercise per Session: 60 min  Stress: No Stress Concern Present (09/10/2022)   [employer] of Occupational Health - Occupational Stress Questionnaire    Feeling of Stress : Not at all  Social  Connections: Unknown (10/06/2021)   Received from [facility]   Social Network    Social Network: Not on file  Recent Concern: Social Connections - Moderately Isolated (09/07/2021)   Social Connection and Isolation Panel [NHANES]    Frequency of Communication with Friends and Family: More than three times a week    Frequency of Social Gatherings with Friends and Family: More than three times a week    Attends Religious Services: Never    Database administrator or Organizations: No    Attends Banker Meetings: Never    Marital Status: Living with partner    MEDICATIONS:  Current Outpatient Medications  Medication Sig Dispense Refill   baclofen (LIORESAL) 10 MG tablet Take 1 tablet (10 mg total) by mouth 3 (three) times daily. 270 tablet 3   dalfampridine 10 MG TB12 Take 1 tablet (10 mg total) by mouth 2 (two) times daily. 180 tablet 3   Dasiglucagon HCl (ZEGALOGUE) 0.6 MG/0.6ML SOAJ Inject 1 Act into the skin daily as needed. (Patient not taking: Reported on 06/20/2023) 0.6 mL 5   gabapentin (NEURONTIN) 100 MG capsule Take 1 capsule (100 mg total) by mouth 3 (three) times daily. (Patient not taking: Reported on 06/20/2023) 90 capsule 5   glucose blood (CONTOUR NEXT TEST) test strip USE 2 TIMES PER DAY. E10.65 (Patient not taking: Reported on 06/20/2023) 200 strip 3   levocetirizine  (XYZAL) 5 MG tablet TAKE 1 TABLET BY MOUTH EVERY DAY IN THE EVENING (Patient not taking: Reported on 06/20/2023) 90 tablet 1   losartan (COZAAR) 50 MG tablet TAKE 1 TABLET BY MOUTH EVERY DAY 90 tablet 3   NOVOLOG 100 UNIT/ML injection USE UP TO 80 UNITS IN INSULIN PUMP DAILY-DX CODE E10.8 70 mL 2   OVER THE COUNTER MEDICATION Sea moss supplement-vitamin and minerals daily (Patient not taking: Reported on 06/20/2023)     silodosin (RAPAFLO) 8 MG CAPS capsule Take 1 capsule (8 mg total) by mouth daily with breakfast. (Patient not taking: Reported on 06/20/2023) 30 capsule 11   simvastatin (ZOCOR) 20 MG tablet Take 1 tablet (20 mg total) by mouth daily at 6 PM. 90 tablet 0   solifenacin (VESICARE) 5 MG tablet TAKE 1 TABLET (5 MG TOTAL) BY MOUTH DAILY. 90 tablet 0   SYNTHROID 125 MCG tablet Take 1 tablet by mouth 6 days per week. 78 tablet 3   No current facility-administered medications for this visit.    PHYSICAL EXAM: There were no vitals filed for this visit.  There is no height or weight on file to calculate BMI.  Wt Readings from Last 3 Encounters:  06/20/23 209 lb (94.8 kg)  06/17/23 207 lb (93.9 kg)  03/19/23 197 lb 3.2 oz (89.4 kg)    General: Well developed, well nourished male in no apparent distress.  HEENT: AT/[place], no external lesions.  Eyes: Conjunctiva clear and no icterus. Neurologic: Alert, oriented, normal speech Psychiatric: Does not appear depressed or anxious  Diabetic Foot Exam - Simple   No data filed    LABS Reviewed Lab Results  Component Value Date   HGBA1C 7.3 (H) 07/08/2023   HGBA1C 7.5 (H) 03/15/2023   HGBA1C 7.4 (H) 10/26/2022   Lab Results  Component Value Date   FRUCTOSAMINE 303 (H) 07/08/2023   FRUCTOSAMINE 383 (H) 12/03/2019   FRUCTOSAMINE 355 (H) 08/27/2018   Lab Results  Component Value Date   CHOL 136 07/08/2023   HDL 50 07/08/2023   LDLCALC 72 07/08/2023  LDLDIRECT 65.0 02/09/2021   TRIG 49 07/08/2023   CHOLHDL 2.7 07/08/2023   Lab  Results  Component Value Date   MICRALBCREAT 0.5 06/17/2023   MICRALBCREAT 0.5 08/28/2021   Lab Results  Component Value Date   CREATININE 1.20 07/08/2023   Lab Results  Component Value Date   GFR 74.02 06/17/2023    ASSESSMENT / PLAN  1. Uncontrolled type 1 diabetes mellitus with hyperglycemia (HCC)   2. Acquired autoimmune hypothyroidism      Diabetes Mellitus type 1, complicated by diabetic retinopathy, possibly neuropathy. - Diabetic status / severity: Uncontrolled.  Lab Results  Component Value Date   HGBA1C 7.3 (H) 07/08/2023    - Hemoglobin A1c goal <7%   Not able to download pump and CGM data to review.  Patient reported he is having hypoglycemia around midnight and 1 AM.  This could be related to either related with higher meal bolus he eats supper around 8 PM, or it could be related to not suspending or decreasing basal rates on time from smart card mode.  - Medications:  Insulin pump setting changed as follows:   Medtronic 780G using NovoLog U100  Insulin Pump setting:  Basal MN- 0.10 u/hour 4AM- 1.15  6AM- 1.4, changed to 1.2 9AM- 1.4, changed to 1.2 10PM-       0.150, changed to 9 PM  Bolus CHO Ratio (1unit:CHO) MN- 1:15 7AM- 1:8 12PM- 1:8 6PM- 1:9.3, changed to 1:12.  If he is still having low around midnight asked to change to 1 : 15. 10PM-  1:20  Correction/Sensitivity: MN- 1:35  Target: 130  Active insulin time: 2 hours   - Home glucose testing: continue CGM and check blood glucose as needed.  - Discussed/ Gave Hypoglycemia treatment plan.  # Consult : not required at this time.   # Annual urine for microalbuminuria/ creatinine ratio, no microalbuminuria currently, continue ACE/ARB /losartan.  Will check in next visit. Last  Lab Results  Component Value Date   MICRALBCREAT 0.5 06/17/2023    # Foot check nightly / neuropathy, continue gabapentin.  Managed by neurology/PCP.  # He has proliferative diabetic retinopathy,  regularly following with ophthalmology..   - Diet: Make healthy diabetic food choices.  Discussed about reducing carb content in the meals.  2. Blood pressure  -  BP Readings from Last 1 Encounters:  06/20/23 130/78    - Control is in target.  - No change in current plans.  3. Lipid status / Hyperlipidemia - Last  Lab Results  Component Value Date   LDLCALC 72 07/08/2023   - Continue simvastatin 20 mg daily.  # Primary hypothyroidism -Continue Synthroid 125 mg 6 days a week.  Recent TSH normal.  Diagnoses and all orders for this visit:  Uncontrolled type 1 diabetes mellitus with hyperglycemia (HCC)  Acquired autoimmune hypothyroidism   DISPOSITION Follow up in clinic in 3 months suggested.   All questions answered and patient verbalized understanding of the plan.  [place] Menashe Kafer, MD [facility] Endocrinology [facility] [address] [place], [address] [place], [place] [address] Phone # [phone]  At least part of this note was generated using voice recognition software. Inadvertent word errors may have occurred, which were not recognized during the proofreading process.

## 2023-07-15 NOTE — Telephone Encounter (Signed)
Noted thank you.  HST [employer] no auth req.  Patient is scheduled to pick up on 07/23/23 at 10 AM.

## 2023-07-15 NOTE — Telephone Encounter (Signed)
Dr. [place] Thapa has order the patient a HST through [place]. Would you like the order the patient a HST? If you so please addend your last office note and place a HST order? Thank you.

## 2023-07-15 NOTE — Addendum Note (Signed)
Addended by: Despina Arias A on: 07/15/2023 12:50 PM   Modules accepted: Orders

## 2023-07-18 ENCOUNTER — Other Ambulatory Visit: Payer: Self-pay | Admitting: Internal Medicine

## 2023-07-18 DIAGNOSIS — R0981 Nasal congestion: Secondary | ICD-10-CM

## 2023-07-22 ENCOUNTER — Encounter: Payer: Self-pay | Admitting: Physical Therapy

## 2023-07-22 ENCOUNTER — Ambulatory Visit: Payer: Medicare PPO | Admitting: Physical Therapy

## 2023-07-22 DIAGNOSIS — M25511 Pain in right shoulder: Secondary | ICD-10-CM | POA: Diagnosis not present

## 2023-07-22 DIAGNOSIS — R2689 Other abnormalities of gait and mobility: Secondary | ICD-10-CM

## 2023-07-22 DIAGNOSIS — G35 Multiple sclerosis: Secondary | ICD-10-CM | POA: Diagnosis not present

## 2023-07-22 DIAGNOSIS — M6281 Muscle weakness (generalized): Secondary | ICD-10-CM

## 2023-07-22 DIAGNOSIS — G8929 Other chronic pain: Secondary | ICD-10-CM | POA: Diagnosis not present

## 2023-07-22 DIAGNOSIS — R269 Unspecified abnormalities of gait and mobility: Secondary | ICD-10-CM | POA: Diagnosis not present

## 2023-07-22 DIAGNOSIS — R2681 Unsteadiness on feet: Secondary | ICD-10-CM | POA: Diagnosis not present

## 2023-07-22 DIAGNOSIS — R262 Difficulty in walking, not elsewhere classified: Secondary | ICD-10-CM | POA: Diagnosis not present
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
## 2023-07-23 ENCOUNTER — Other Ambulatory Visit: Payer: Self-pay

## 2023-07-23 ENCOUNTER — Telehealth: Payer: Self-pay | Admitting: Endocrinology

## 2023-07-23 ENCOUNTER — Ambulatory Visit: Payer: Medicare PPO | Admitting: Neurology

## 2023-07-23 DIAGNOSIS — G4733 Obstructive sleep apnea (adult) (pediatric): Secondary | ICD-10-CM | POA: Diagnosis not present

## 2023-07-23 DIAGNOSIS — E1065 Type 1 diabetes mellitus with hyperglycemia: Secondary | ICD-10-CM

## 2023-07-23 DIAGNOSIS — R0683 Snoring: Secondary | ICD-10-CM

## 2023-07-23 DIAGNOSIS — G4719 Other hypersomnia: Secondary | ICD-10-CM

## 2023-07-23 MED ORDER — LOSARTAN POTASSIUM 50 MG PO TABS: 50.0000 mg | ORAL_TABLET | Freq: Every day | ORAL | 3 refills | Status: DC

## 2023-07-23 NOTE — Telephone Encounter (Signed)
 Patient called and said he needs a refill on losartan (COZAAR) 50 MG tablet and the pharmacy said they havent heard back from [place]. It was originally filled by Dr Lucianne Muss so its possibly being sent in wrong. He would like it sent to pharmacy below  [id number] - [place], [place] - [address] OF [address]

## 2023-07-24 NOTE — Progress Notes (Signed)
     [facility]  HOME SLEEP STUDY  STUDY DATE: 07/23/2023 PATIENT NAME: Johnathan Ross DOB: [birth year] MRN: [id number]  ORDERING CLINICIAN: Richard A. Epimenio Foot, MD, PhD INTERPRETING CLINICIAN: Richard A. Sater, MD. PhD   CLINICAL INFORMATION: 53 year old man with multiple sclerosis who has snoring and excessive daytime sleepiness   IMPRESSION:  Borderline obstructive sleep apnea with a pAHI 3% of 5.0/h. No nocturnal hypoxemia was noted. Mild reduced sleep efficiency of 73%.   RECOMMENDATION: The OSA is borderline and not severe enough to treat with CPAP. If the snoring is troublesome, consider an oral appliance. Follow-up with Dr. Epimenio Foot.   INTERPRETING PHYSICIAN:   Richard A. Epimenio Foot, MD, PhD, [facility] Certified in Neurology, Clinical Neurophysiology, Sleep Medicine, Pain Medicine and Neuroimaging  [facility] [address] [place], [place] [address] [phone]

## 2023-07-26 DIAGNOSIS — E1065 Type 1 diabetes mellitus with hyperglycemia: Secondary | ICD-10-CM | POA: Diagnosis not present

## 2023-07-29 ENCOUNTER — Ambulatory Visit: Payer: Medicare PPO | Attending: Neurology | Admitting: Physical Therapy

## 2023-07-29 ENCOUNTER — Encounter: Payer: Self-pay | Admitting: Physical Therapy

## 2023-07-29 DIAGNOSIS — M25511 Pain in right shoulder: Secondary | ICD-10-CM | POA: Insufficient documentation

## 2023-07-29 DIAGNOSIS — M21371 Foot drop, right foot: Secondary | ICD-10-CM | POA: Diagnosis not present

## 2023-07-29 DIAGNOSIS — M21372 Foot drop, left foot: Secondary | ICD-10-CM | POA: Insufficient documentation

## 2023-07-29 DIAGNOSIS — R29818 Other symptoms and signs involving the nervous system: Secondary | ICD-10-CM | POA: Insufficient documentation

## 2023-07-29 DIAGNOSIS — R262 Difficulty in walking, not elsewhere classified: Secondary | ICD-10-CM | POA: Insufficient documentation

## 2023-07-29 DIAGNOSIS — G8929 Other chronic pain: Secondary | ICD-10-CM | POA: Diagnosis not present

## 2023-07-29 DIAGNOSIS — R296 Repeated falls: Secondary | ICD-10-CM | POA: Insufficient documentation

## 2023-07-29 DIAGNOSIS — R2681 Unsteadiness on feet: Secondary | ICD-10-CM | POA: Diagnosis not present

## 2023-07-29 DIAGNOSIS — R2689 Other abnormalities of gait and mobility: Secondary | ICD-10-CM | POA: Diagnosis not present

## 2023-07-29 DIAGNOSIS — M6281 Muscle weakness (generalized): Secondary | ICD-10-CM | POA: Diagnosis not present

## 2023-07-29 NOTE — Therapy (Signed)
 OUTPATIENT PHYSICAL THERAPY NEURO TREATMENT NOTE   Patient Name: Johnathan Ross MRN: [id number] DOB:1971-02-09, 53 y.o., male Today's Date: 07/29/2023   PCP: Etta Grandchild, MD  REFERRING PROVIDER: Asa Lente, MD  END OF SESSION:  PT End of Session - 07/29/23 0944     Visit Number 3    Number of Visits 7   Plus eval   Date for PT Re-Evaluation 08/29/23    Authorization Type [employer]    PT Start Time 0930    PT Stop Time 1017    PT Time Calculation (min) 47 min    Activity Tolerance Patient tolerated treatment well    Behavior During Therapy [facility] for tasks assessed/performed              Past Medical History:  Diagnosis Date   Hyperlipidemia    Hypertension    Hypogonadism in male 11/01/2016   Hypothyroidism    Multiple sclerosis (HCC)    Neuromuscular disorder (HCC)    Proliferative diabetic retinopathy(362.02)    Type 1 diabetes mellitus (HCC) dx'd 1994   Vitamin D insufficiency 11/29/2016   Past Surgical History:  Procedure Laterality Date   EYE SURGERY Bilateral    "laser for diabetic retinopathy"   FRACTURE SURGERY     IR VENO/EXT/UNI LEFT  03/24/2020   OPEN REDUCTION INTERNAL FIXATION (ORIF) TIBIA/FIBULA FRACTURE Right 10/30/2016   ORIF ANKLE FRACTURE Left 2015   ORIF TIBIA FRACTURE Right 10/30/2016   Procedure: OPEN REDUCTION INTERNAL FIXATION (ORIF) TIBIA FIBULA FRACTURE;  Surgeon: Myrene Galas, MD;  Location: [facility] OR;  Service: Orthopedics;  Laterality: Right;   RETINAL LASER PROCEDURE Bilateral    "for diabetic retinopathy"   Patient Active Problem List   Diagnosis Date Noted   Encounter for screening for HIV 06/17/2023   Need for immunization against influenza 06/17/2023   Loud snoring 06/17/2023   Encounter for general adult medical examination with abnormal findings 08/02/2022   Screening for prostate cancer 08/01/2021   OAB (overactive bladder) 09/21/2020   Essential hypertension 09/21/2020   Thoracic outlet syndrome 05/03/2020    Controlled type 1 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 (HCC) 03/24/2020   Right epiretinal membrane 03/24/2020   Retinal exudates and deposits 03/24/2020   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   Nuclear sclerotic cataract of both eyes 03/24/2020   Gait disorder 03/23/2020   Benign prostatic hyperplasia without lower urinary tract symptoms 03/01/2020   Diabetic gastroparesis associated with type 1 diabetes mellitus (HCC) 03/01/2020   Gait disturbance 08/19/2019   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/19/2019   Vitamin D insufficiency 11/29/2016   Hypogonadism in male 11/01/2016   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HCC)    Hypothyroidism    Hypertension    Dyslipidemia, goal LDL below 130    MULTIPLE SCLEROSIS 08/29/2007   PROLIFERATIVE DIABETIC RETINOPATHY 08/29/2007    ONSET DATE: 06/20/2023  REFERRING DIAG: G35 (ICD-10-CM) - Multiple sclerosis (HCC) R26.9 (ICD-10-CM) - Gait disturbance  THERAPY DIAG:  Muscle weakness (generalized)  Other abnormalities of gait and mobility  Unsteadiness on feet  Difficulty in walking, not elsewhere classified  Chronic right shoulder pain  Rationale for Evaluation and Treatment: Rehabilitation  SUBJECTIVE:  SUBJECTIVE STATEMENT: Pt wearing [place] AFO on LLE. He continues reporting excessive spring in toe of L AFO. His right shoulder continues to bother him in an IR position.  He denies falls or acute changes.  Pt accompanied by: self  PERTINENT HISTORY: vitamin D insufficiency, Type 1 IDDM w/ polyneuropathy, diabetic retinopathy, hypothyroidism, HTN, hypercholestrolemia, h/o Rt tibia fracture, h/o Lt ankle fracture with ORIF, MS with acquired diplegia (MS diagnosis 2014/2015)  PAIN:  Are you having  pain? Yes: NPRS scale: 2-3 Pain location: R shoulder  Pain description: Radiates down RUE, anterior joint pain, unable to perform IR well   Typically just when IR or behind back PRECAUTIONS: Fall  RED FLAGS: None   WEIGHT BEARING RESTRICTIONS: No  FALLS: Has patient fallen in last 6 months? No  LIVING ENVIRONMENT: Lives with: lives with their partner Lives in: House/apartment Stairs: Yes: External: 5 steps; on right going up, on left going up, and can reach both Has following equipment at home: Single point cane, Walker - 2 wheeled, Wheelchair (manual), and shower chair  PLOF: Requires assistive device for independence and Needs assistance with homemaking  PATIENT GOALS: "I wanna learn how to walk and my feet not hit the ground"   OBJECTIVE:  Note: Objective measures were completed at Evaluation unless otherwise noted.  DIAGNOSTIC FINDINGS: MRI of brain from 07/2017  IMPRESSION: 1. Diffuse periventricular T2 hyperintensities with involving the corpus callosum are stable from the prior exam. 2. Scattered subcortical and upper cervical spine lesions are also stable. 3. No new lesions, restricted diffusion, or focal enhancement to suggest progression of disease. 4. No acute intracranial abnormality.  *Updated imaging ordered by Dr. Epimenio Foot (MRI of brain and C-spine)   COGNITION: Overall cognitive status: Within functional limits for tasks assessed   SENSATION: Pt reports his LUE was numb a few years ago due to a blood clot, is numb occasionally now but not consistently.    MUSCLE TONE: Increased extensor tone of LLE >RLE but ROM and coordination limited bilaterally due to extensor tone/spasticity    POSTURE: rounded shoulders, forward head, and weight shift right  LOWER EXTREMITY ROM:   Limited by extensor tone/spasticity   Active  Right Eval Left Eval  Hip flexion    Hip extension    Hip abduction    Hip adduction    Hip internal rotation    Hip external  rotation    Knee flexion    Knee extension    Ankle dorsiflexion    Ankle plantarflexion    Ankle inversion    Ankle eversion     (Blank rows = not tested)  LOWER EXTREMITY MMT:  Tested in seated position   MMT Right Eval Left Eval  Hip flexion 1 1  Hip extension    Hip abduction 4- 4-  Hip adduction    Hip internal rotation    Hip external rotation    Knee flexion 3 3+  Knee extension 3+ 1  Ankle dorsiflexion  0  Ankle plantarflexion    Ankle inversion    Ankle eversion    (Blank rows = not tested)  BED MOBILITY:  Independent per pt   TRANSFERS: Assistive device utilized: Environmental consultant - 2 wheeled  Sit to stand: SBA Stand to sit: SBA Heavy reliance on BUEs to essentially "throw" himself out of chair, wide BOS, unable to obtain full hip extension at top of stand or let go of chair if RW not in front of him    GAIT: Gait pattern:  step to pattern, decreased step length- Left, decreased stride length, decreased hip/knee flexion- Right, decreased hip/knee flexion- Left, decreased ankle dorsiflexion- Right, decreased ankle dorsiflexion- Left, lateral hip instability, trunk flexed, narrow BOS, and poor foot clearance- Left Distance walked: Various clinic distances  Assistive device utilized: Walker - 2 wheeled Level of assistance: SBA Comments: Pt required 9 minutes to ambulate into treatment room (~30') , frequently stopping to rest as his "legs lock up". Noted excessive inversion of L ankle as well as scissoring of LLE. Pt relying heavily on BUEs to perform more of a swing-to pattern.   FUNCTIONAL TESTS:                                                                                                                                 TREATMENT:  07-29-23  Gait:   Gait pattern: step to pattern, decreased step length- Left, decreased stride length, decreased hip/knee flexion- Right, decreased hip/knee flexion- Left, decreased ankle dorsiflexion- Right, decreased ankle dorsiflexion-  Left, lateral hip instability, trunk flexed, narrow BOS, and poor foot clearance- Left Distance walked: Various clinic distances  Assistive device utilized: Environmental consultant - 2 wheeled Level of assistance: SBA Comments: Pt wearing [place] AFO on LLE - inconsistent foot clearance achieved with brace due to Lt hip flexor and hamstring weakness and spasticity:  Pt used intermittent reciprocal gait pattern with swing through gait pattern with ambulation distance from lobby to clinic gym.  Increased time x10 minutes alloted to enter and exit gym using this pattern to practice heel strike with AFO, foot clearance, and knee control w/ mild success.  TherEx:  In prone: -Quad stretch x45 seconds each side -Isometric knee flexion holds (initiated prone with this) - improved following quad stretch -AA hip extension w/ bent knee  In side-lying: -Hip extension on powderboard x20 each LE -Knee flexion 4x3, pt begins to lose ROM at 3 reps; pt needs more facilitation on RLE than LLE w/ PT blocking anterior knee from allowing forward compensation with hip flexion as well as unlocking knee when pt overcorrects return to neutral knee extension, pt requests side-lying quad stretch following second bout - used strap to stretch quads and hip flexors (PT performed for pt to prevent stress on shoulder and IR of thigh) - mild improvement in hamstring engagement following stretch -Stretched quads in side-lying then sitting prior to exiting gym to assess carryover to gait training w/o initial success noted, some improvement in swing phase with distance on the LLE more than the RLE.  PATIENT EDUCATION: Education details: Neuromotor control related to MS - excessive secondary muscle contraction and difficulty relaxing following tasks, hip flexion/knee flexion needed for gait, relevance of isolated movements completed today.  Explained impingement vs RTC tear vs bursitis and possible ortho MD follow-up in future if this  progresses. Person educated: Patient Education method: Explanation Education comprehension: needs further education  HOME EXERCISE PROGRAM: To be reviewed from previous POC: [id number] and [id number]  HEP - Medbridge Access Code: [email or web address] URL: [email or web address] Date: 07/22/2023 Prepared by: Maebelle Munroe  Exercises - Seated Shoulder Abduction and External Rotation AROM  - 1 x daily - 7 x weekly - 1 sets - 2-3 reps - 10-15 hold - Seated Lifting Hands Behind Back  - 1 x daily - 7 x weekly - 1 sets - 2-3 reps - 10-15 hold - Doorway Pec Stretch at 90 Degrees Abduction  - 1 x daily - 7 x weekly - 1 sets - 2-3 reps - 10-15 secs hold - Standing Shoulder Flexion with Dumbbells  - 1 x daily - 7 x weekly - 2 sets - 10 reps - Seated Single Arm Shoulder Horizontal Abduction - Thumb Up  - 1 x daily - 7 x weekly - 2 sets - 10 reps - Seated Shoulder Extension  - 1 x daily - 7 x weekly - 2 sets - 10 reps  GOALS: Goals reviewed with patient? Yes  SHORT TERM GOALS: Target date: 08/01/2023   Pt will be independent with mobility and strength HEP for improved strength, balance and transfers.   Baseline: to be reviewed from previous POC Goal status: INITIAL    LONG TERM GOALS: Target date: 08/15/2023   Pt will be compliant with use of WC and bilateral AFOs at all times for reduced fall risk, energy efficiency and proper body mechanics.  Baseline: pt not using AD at home or wearing AFOs  Goal status: INITIAL   ASSESSMENT:  CLINICAL IMPRESSION: Focus of skilled PT session today on further working into flexion pattern to support gait training as able.  Pt continues to be limited by co-contraction and heavy muscular compensation in BLE.  He is limited in R more than L swing phase.  PT to continue working to support should stability and pain management as patient's ambulatory status heavily depends on upper body preservation.  Will continue per POC.  OBJECTIVE IMPAIRMENTS: Abnormal  gait, decreased activity tolerance, decreased balance, decreased cognition, decreased coordination, decreased endurance, decreased knowledge of condition, decreased knowledge of use of DME, decreased mobility, difficulty walking, decreased strength, decreased safety awareness, impaired perceived functional ability, increased muscle spasms, impaired sensation, impaired tone, improper body mechanics, and pain  ACTIVITY LIMITATIONS: carrying, lifting, bending, standing, squatting, stairs, transfers, locomotion level, and caring for others  PARTICIPATION LIMITATIONS: cleaning, driving, shopping, community activity, occupation, and yard work  PERSONAL FACTORS: Behavior pattern, Education, Fitness, Past/current experiences, and 1-2 comorbidities: MS  are also affecting patient's functional outcome.   REHAB POTENTIAL: Poor Due to poor compliance w/PT education and severity of deficits   CLINICAL DECISION MAKING: Unstable/unpredictable  EVALUATION COMPLEXITY: High  PLAN:  PT FREQUENCY: 1x/week  PT DURATION: 6 weeks  PLANNED INTERVENTIONS: 97164- PT Re-evaluation, 97110-Therapeutic exercises, 97530- Therapeutic activity, 97112- Neuromuscular re-education, [phone]- Self Care, 11914- Manual therapy, [phone]- Gait training, [phone]- Orthotic Fit/training, [phone]- Aquatic Therapy, [phone]- Electrical stimulation (manual), Patient/Family education, Balance training, Joint mobilization, DME instructions, and Wheelchair mobility training  PLAN FOR NEXT SESSION: Review new exercises added to HEP - try exercise off edge of mat - hip extension control for strengthening Lt hip flexors and hamstrings, LE stretching, shoulder isometrics, transitioning to quadruped   Sadie Haber, PT, DPT 07/29/2023, 11:00 AM

## 2023-07-30 ENCOUNTER — Encounter: Payer: Self-pay | Admitting: Neurology

## 2023-07-31 NOTE — Telephone Encounter (Signed)
 Copied from CRM [id number]. Topic: General - Other >> Jul 31, 2023 10:17 AM Elizebeth Brooking wrote: Reason for CRM: Patient called in stating he would like for someone to give him a callback regarding his sleep study test

## 2023-08-01 DIAGNOSIS — R3914 Feeling of incomplete bladder emptying: Secondary | ICD-10-CM | POA: Diagnosis not present

## 2023-08-01 DIAGNOSIS — R350 Frequency of micturition: Secondary | ICD-10-CM | POA: Diagnosis not present

## 2023-08-01 DIAGNOSIS — R3912 Poor urinary stream: Secondary | ICD-10-CM | POA: Diagnosis not present

## 2023-08-01 DIAGNOSIS — N130 Hydronephrosis with ureteropelvic junction obstruction: Secondary | ICD-10-CM | POA: Diagnosis not present

## 2023-08-05 ENCOUNTER — Encounter: Payer: Self-pay | Admitting: Physical Therapy

## 2023-08-05 ENCOUNTER — Ambulatory Visit: Payer: Medicare PPO | Admitting: Physical Therapy

## 2023-08-05 DIAGNOSIS — R2681 Unsteadiness on feet: Secondary | ICD-10-CM | POA: Diagnosis not present

## 2023-08-05 DIAGNOSIS — R262 Difficulty in walking, not elsewhere classified: Secondary | ICD-10-CM | POA: Diagnosis not present

## 2023-08-05 DIAGNOSIS — M6281 Muscle weakness (generalized): Secondary | ICD-10-CM

## 2023-08-05 DIAGNOSIS — R29818 Other symptoms and signs involving the nervous system: Secondary | ICD-10-CM | POA: Diagnosis not present

## 2023-08-05 DIAGNOSIS — R2689 Other abnormalities of gait and mobility: Secondary | ICD-10-CM

## 2023-08-05 DIAGNOSIS — R296 Repeated falls: Secondary | ICD-10-CM | POA: Diagnosis not present

## 2023-08-05 DIAGNOSIS — G8929 Other chronic pain: Secondary | ICD-10-CM | POA: Diagnosis not present

## 2023-08-05 DIAGNOSIS — M21371 Foot drop, right foot: Secondary | ICD-10-CM | POA: Diagnosis not present

## 2023-08-05 DIAGNOSIS — M25511 Pain in right shoulder: Secondary | ICD-10-CM | POA: Diagnosis not present

## 2023-08-05 NOTE — Therapy (Signed)
 OUTPATIENT PHYSICAL THERAPY NEURO TREATMENT NOTE   Patient Name: Johnathan Ross MRN: [id number] DOB:December 29, 1970, 53 y.o., male Today's Date: 08/05/2023   PCP: Etta Grandchild, MD  REFERRING PROVIDER: Asa Lente, MD  END OF SESSION:  PT End of Session - 08/05/23 1034     Visit Number 4    Number of Visits 7   Plus eval   Date for PT Re-Evaluation 08/29/23    Authorization Type [employer]    PT Start Time 0925    PT Stop Time 1018    PT Time Calculation (min) 53 min    Activity Tolerance Patient tolerated treatment well    Behavior During Therapy [facility] for tasks assessed/performed               Past Medical History:  Diagnosis Date   Hyperlipidemia    Hypertension    Hypogonadism in male 11/01/2016   Hypothyroidism    Multiple sclerosis (HCC)    Neuromuscular disorder (HCC)    Proliferative diabetic retinopathy(362.02)    Type 1 diabetes mellitus (HCC) dx'd 1994   Vitamin D insufficiency 11/29/2016   Past Surgical History:  Procedure Laterality Date   EYE SURGERY Bilateral    "laser for diabetic retinopathy"   FRACTURE SURGERY     IR VENO/EXT/UNI LEFT  03/24/2020   OPEN REDUCTION INTERNAL FIXATION (ORIF) TIBIA/FIBULA FRACTURE Right 10/30/2016   ORIF ANKLE FRACTURE Left 2015   ORIF TIBIA FRACTURE Right 10/30/2016   Procedure: OPEN REDUCTION INTERNAL FIXATION (ORIF) TIBIA FIBULA FRACTURE;  Surgeon: Myrene Galas, MD;  Location: [facility] OR;  Service: Orthopedics;  Laterality: Right;   RETINAL LASER PROCEDURE Bilateral    "for diabetic retinopathy"   Patient Active Problem List   Diagnosis Date Noted   Encounter for screening for HIV 06/17/2023   Need for immunization against influenza 06/17/2023   Loud snoring 06/17/2023   Encounter for general adult medical examination with abnormal findings 08/02/2022   Screening for prostate cancer 08/01/2021   OAB (overactive bladder) 09/21/2020   Essential hypertension 09/21/2020   Thoracic outlet syndrome  05/03/2020   Controlled type 1 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 (HCC) 03/24/2020   Right epiretinal membrane 03/24/2020   Retinal exudates and deposits 03/24/2020   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   Nuclear sclerotic cataract of both eyes 03/24/2020   Gait disorder 03/23/2020   Benign prostatic hyperplasia without lower urinary tract symptoms 03/01/2020   Diabetic gastroparesis associated with type 1 diabetes mellitus (HCC) 03/01/2020   Gait disturbance 08/19/2019   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/19/2019   Vitamin D insufficiency 11/29/2016   Hypogonadism in male 11/01/2016   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HCC)    Hypothyroidism    Hypertension    Dyslipidemia, goal LDL below 130    MULTIPLE SCLEROSIS 08/29/2007   PROLIFERATIVE DIABETIC RETINOPATHY 08/29/2007    ONSET DATE: 06/20/2023  REFERRING DIAG: G35 (ICD-10-CM) - Multiple sclerosis (HCC) R26.9 (ICD-10-CM) - Gait disturbance  THERAPY DIAG:  Muscle weakness (generalized)  Other abnormalities of gait and mobility  Other symptoms and signs involving the nervous system  Rationale for Evaluation and Treatment: Rehabilitation  SUBJECTIVE:  SUBJECTIVE STATEMENT: Pt reports no new problems or changes - continues to wear AFO on LLE; requests to work on stretching legs today.  Reports no shoulder pain - says they are feeling good  Pt accompanied by: self  PERTINENT HISTORY: vitamin D insufficiency, Type 1 IDDM w/ polyneuropathy, diabetic retinopathy, hypothyroidism, HTN, hypercholestrolemia, h/o Rt tibia fracture, h/o Lt ankle fracture with ORIF, MS with acquired diplegia (MS diagnosis 2014/2015)  PAIN:   No pain reported on 08-05-23  Are you having pain? No  PRECAUTIONS:  Fall  RED FLAGS: None   WEIGHT BEARING RESTRICTIONS: No  FALLS: Has patient fallen in last 6 months? No  LIVING ENVIRONMENT: Lives with: lives with their partner Lives in: House/apartment Stairs: Yes: External: 5 steps; on right going up, on left going up, and can reach both Has following equipment at home: Single point cane, Walker - 2 wheeled, Wheelchair (manual), and shower chair  PLOF: Requires assistive device for independence and Needs assistance with homemaking  PATIENT GOALS: "I wanna learn how to walk and my feet not hit the ground"   OBJECTIVE:  Note: Objective measures were completed at Evaluation unless otherwise noted.  DIAGNOSTIC FINDINGS: MRI of brain from 07/2017  IMPRESSION: 1. Diffuse periventricular T2 hyperintensities with involving the corpus callosum are stable from the prior exam. 2. Scattered subcortical and upper cervical spine lesions are also stable. 3. No new lesions, restricted diffusion, or focal enhancement to suggest progression of disease. 4. No acute intracranial abnormality.  *Updated imaging ordered by Dr. Epimenio Foot (MRI of brain and C-spine)   COGNITION: Overall cognitive status: Within functional limits for tasks assessed   SENSATION: Pt reports his LUE was numb a few years ago due to a blood clot, is numb occasionally now but not consistently.    MUSCLE TONE: Increased extensor tone of LLE >RLE but ROM and coordination limited bilaterally due to extensor tone/spasticity    POSTURE: rounded shoulders, forward head, and weight shift right  LOWER EXTREMITY ROM:   Limited by extensor tone/spasticity   Active  Right Eval Left Eval  Hip flexion    Hip extension    Hip abduction    Hip adduction    Hip internal rotation    Hip external rotation    Knee flexion    Knee extension    Ankle dorsiflexion    Ankle plantarflexion    Ankle inversion    Ankle eversion     (Blank rows = not tested)  LOWER EXTREMITY MMT:  Tested in  seated position   MMT Right Eval Left Eval  Hip flexion 1 1  Hip extension    Hip abduction 4- 4-  Hip adduction    Hip internal rotation    Hip external rotation    Knee flexion 3 3+  Knee extension 3+ 1  Ankle dorsiflexion  0  Ankle plantarflexion    Ankle inversion    Ankle eversion    (Blank rows = not tested)  BED MOBILITY:  Independent per pt   TRANSFERS: Assistive device utilized: Environmental consultant - 2 wheeled  Sit to stand: SBA Stand to sit: SBA Heavy reliance on BUEs to essentially "throw" himself out of chair, wide BOS, unable to obtain full hip extension at top of stand or let go of chair if RW not in front of him    GAIT: Gait pattern: step to pattern, decreased step length- Left, decreased stride length, decreased hip/knee flexion- Right, decreased hip/knee flexion- Left, decreased ankle dorsiflexion- Right, decreased ankle  dorsiflexion- Left, lateral hip instability, trunk flexed, narrow BOS, and poor foot clearance- Left Distance walked: Various clinic distances  Assistive device utilized: Walker - 2 wheeled Level of assistance: SBA Comments: Pt required 9 minutes to ambulate into treatment room (~30') , frequently stopping to rest as his "legs lock up". Noted excessive inversion of L ankle as well as scissoring of LLE. Pt relying heavily on BUEs to perform more of a swing-to pattern.   FUNCTIONAL TESTS:                                                                                                                                 TREATMENT:  08-05-23  TherAct: Pt transferred from sitting to long sitting on mat table with min to mod assist to transfer LE's onto mat  Maintained long sitting independently Transferred from sitting to Rt side sitting - on forearm and back to upright sitting with CGA; pt then performed sitting to Lt sidesitting with CGA, then back to upright sittin   Gait:  (at end of session) Gait pattern: step to pattern, decreased step length- Left,  decreased stride length, decreased hip/knee flexion- Right, decreased hip/knee flexion- Left, decreased ankle dorsiflexion- Right, decreased ankle dorsiflexion- Left, lateral hip instability, trunk flexed, narrow BOS, and poor foot clearance- Left Distance walked: 230' (2 laps) - with reciprocal gait pattern  Assistive device utilized: Environmental consultant - 2 wheeled Level of assistance: SBA Comments: Pt wearing [place] AFO on LLE - inconsistent foot clearance achieved with brace due to Lt hip flexor and hamstring weakness and spasticity:  Pt used intermittent reciprocal gait pattern with swing through gait pattern with ambulation distance from lobby to clinic gym.    TherEx:  Bridging 5 reps Bridging with RLE extension - 3 reps only due to fatigue Lt hip extension control exercise off edge of mat - knee held in 90 degree flexion - 10 reps with min assist for final end ROM prn to assist with clearing edge of mat   Passive Lt hamstring stretching - pt's Lt foot placed on therapist's shoulder to maintain Lt knee in extended position - 30 secs static stretch x 2 reps, then contract/relax with 5 sec hold x 5 reps  In supine - Lt unilateral bridge 10 reps Lt hip flexion in hooklying position with min to mod assist 10 reps In Rt sidelying - Lt hip flexion/extension with knee held in flexed position by PT - 10 reps Clam shell exercise 10 reps with 3 sec hold In seated position on side of mat - pt performed Lt knee flexion 10 reps (foot inside pillow case to reduce friction) ;  RLE 10 reps with foot inside pillowcase - pt had more difficulty performing active knee flexion with RLE than with LLE   HEP - Medbridge Access Code: [email or web address] URL: [email or web address] Date: 07/22/2023 Prepared by: Maebelle Munroe  Exercises - Seated Shoulder Abduction and External Rotation AROM  - 1 x daily -  7 x weekly - 1 sets - 2-3 reps - 10-15 hold - Seated Lifting Hands Behind Back  - 1 x daily - 7 x weekly - 1 sets  - 2-3 reps - 10-15 hold - Doorway Pec Stretch at 90 Degrees Abduction  - 1 x daily - 7 x weekly - 1 sets - 2-3 reps - 10-15 secs hold - Standing Shoulder Flexion with Dumbbells  - 1 x daily - 7 x weekly - 2 sets - 10 reps - Seated Single Arm Shoulder Horizontal Abduction - Thumb Up  - 1 x daily - 7 x weekly - 2 sets - 10 reps - Seated Shoulder Extension  - 1 x daily - 7 x weekly - 2 sets - 10 reps  PATIENT EDUCATION: Education details: Medbridge  Person educated: Patient Education method: Explanation Education comprehension: needs further education  HOME EXERCISE PROGRAM: To be reviewed from previous POC: [id number] and [id number]   GOALS: Goals reviewed with patient? Yes  SHORT TERM GOALS: Target date: 08/01/2023   Pt will be independent with mobility and strength HEP for improved strength, balance and transfers.   Baseline: to be reviewed from previous POC Goal status:  Goal met 08-05-23     LONG TERM GOALS: Target date: 08/15/2023   Pt will be compliant with use of WC and bilateral AFOs at all times for reduced fall risk, energy efficiency and proper body mechanics.  Baseline: pt not using AD at home or wearing AFOs  Goal status: INITIAL   ASSESSMENT:  CLINICAL IMPRESSION: PT session focused on LE stretching and strengthening with focus on LLE.  Pt continues to have intermittent difficulty clearing Lt foot in swing phase of gait due to extensor tone/spasticity but able to stop and rest during ambulation and achieve increased clearance with increased hip and knee flexion.  Pt noted to have reduced extensor tone after stretching and ROM exercises performed during session - pt gait trained 2 consecutive laps at end of session without seated rest period.  Pt requested to ambulate rather than do exercise on SciFit, due to increased ease with advancing LE's due to decreased extensor tone with less hyperextension occurring.  Cont with POC.    OBJECTIVE IMPAIRMENTS: Abnormal gait,  decreased activity tolerance, decreased balance, decreased cognition, decreased coordination, decreased endurance, decreased knowledge of condition, decreased knowledge of use of DME, decreased mobility, difficulty walking, decreased strength, decreased safety awareness, impaired perceived functional ability, increased muscle spasms, impaired sensation, impaired tone, improper body mechanics, and pain  ACTIVITY LIMITATIONS: carrying, lifting, bending, standing, squatting, stairs, transfers, locomotion level, and caring for others  PARTICIPATION LIMITATIONS: cleaning, driving, shopping, community activity, occupation, and yard work  PERSONAL FACTORS: Behavior pattern, Education, Fitness, Past/current experiences, and 1-2 comorbidities: MS  are also affecting patient's functional outcome.   REHAB POTENTIAL: Poor Due to poor compliance w/PT education and severity of deficits   CLINICAL DECISION MAKING: Unstable/unpredictable  EVALUATION COMPLEXITY: High  PLAN:  PT FREQUENCY: 1x/week  PT DURATION: 6 weeks  PLANNED INTERVENTIONS: 97164- PT Re-evaluation, 97110-Therapeutic exercises, 97530- Therapeutic activity, 97112- Neuromuscular re-education, 97535- Self Care, 16109- Manual therapy, [phone]- Gait training, [phone]- Orthotic Fit/training, [phone]- Aquatic Therapy, [phone]- Electrical stimulation (manual), Patient/Family education, Balance training, Joint mobilization, DME instructions, and Wheelchair mobility training  PLAN FOR NEXT SESSION:  Cont LE stretching/AROM, gait training, shoulder isometrics; quadruped activities   Kary Kos, PT 08/05/2023, 12:06 PM

## 2023-08-12 ENCOUNTER — Encounter: Payer: Self-pay | Admitting: Physical Therapy

## 2023-08-12 ENCOUNTER — Ambulatory Visit: Payer: Medicare PPO | Admitting: Physical Therapy

## 2023-08-12 DIAGNOSIS — M21371 Foot drop, right foot: Secondary | ICD-10-CM

## 2023-08-12 DIAGNOSIS — G8929 Other chronic pain: Secondary | ICD-10-CM | POA: Diagnosis not present

## 2023-08-12 DIAGNOSIS — R2689 Other abnormalities of gait and mobility: Secondary | ICD-10-CM

## 2023-08-12 DIAGNOSIS — M6281 Muscle weakness (generalized): Secondary | ICD-10-CM | POA: Diagnosis not present

## 2023-08-12 DIAGNOSIS — R29818 Other symptoms and signs involving the nervous system: Secondary | ICD-10-CM | POA: Diagnosis not present

## 2023-08-12 DIAGNOSIS — M25511 Pain in right shoulder: Secondary | ICD-10-CM | POA: Diagnosis not present

## 2023-08-12 DIAGNOSIS — R2681 Unsteadiness on feet: Secondary | ICD-10-CM | POA: Diagnosis not present

## 2023-08-12 DIAGNOSIS — R262 Difficulty in walking, not elsewhere classified: Secondary | ICD-10-CM | POA: Diagnosis not present

## 2023-08-12 DIAGNOSIS — R296 Repeated falls: Secondary | ICD-10-CM

## 2023-08-12 DIAGNOSIS — M21372 Foot drop, left foot: Secondary | ICD-10-CM

## 2023-08-12 NOTE — Therapy (Signed)
 OUTPATIENT PHYSICAL THERAPY NEURO TREATMENT NOTE   Patient Name: Johnathan Ross MRN: [id number] DOB:Oct 30, 1970, 53 y.o., male Today's Date: 08/12/2023   PCP: Etta Grandchild, MD  REFERRING PROVIDER: Asa Lente, MD  END OF SESSION:  PT End of Session - 08/12/23 0941     Visit Number 5    Number of Visits 7   Plus eval   Date for PT Re-Evaluation 08/29/23    Authorization Type [employer]    PT Start Time [phone]    PT Stop Time 1015    PT Time Calculation (min) 44 min    Equipment Utilized During Treatment Gait belt    Activity Tolerance Patient tolerated treatment well    Behavior During Therapy [facility] for tasks assessed/performed               Past Medical History:  Diagnosis Date   Hyperlipidemia    Hypertension    Hypogonadism in male 11/01/2016   Hypothyroidism    Multiple sclerosis (HCC)    Neuromuscular disorder (HCC)    Proliferative diabetic retinopathy(362.02)    Type 1 diabetes mellitus (HCC) dx'd 1994   Vitamin D insufficiency 11/29/2016   Past Surgical History:  Procedure Laterality Date   EYE SURGERY Bilateral    "laser for diabetic retinopathy"   FRACTURE SURGERY     IR VENO/EXT/UNI LEFT  03/24/2020   OPEN REDUCTION INTERNAL FIXATION (ORIF) TIBIA/FIBULA FRACTURE Right 10/30/2016   ORIF ANKLE FRACTURE Left 2015   ORIF TIBIA FRACTURE Right 10/30/2016   Procedure: OPEN REDUCTION INTERNAL FIXATION (ORIF) TIBIA FIBULA FRACTURE;  Surgeon: Myrene Galas, MD;  Location: [facility] OR;  Service: Orthopedics;  Laterality: Right;   RETINAL LASER PROCEDURE Bilateral    "for diabetic retinopathy"   Patient Active Problem List   Diagnosis Date Noted   Encounter for screening for HIV 06/17/2023   Need for immunization against influenza 06/17/2023   Loud snoring 06/17/2023   Encounter for general adult medical examination with abnormal findings 08/02/2022   Screening for prostate cancer 08/01/2021   OAB (overactive bladder) 09/21/2020   Essential  hypertension 09/21/2020   Thoracic outlet syndrome 05/03/2020   Controlled type 1 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 (HCC) 03/24/2020   Right epiretinal membrane 03/24/2020   Retinal exudates and deposits 03/24/2020   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   Nuclear sclerotic cataract of both eyes 03/24/2020   Gait disorder 03/23/2020   Benign prostatic hyperplasia without lower urinary tract symptoms 03/01/2020   Diabetic gastroparesis associated with type 1 diabetes mellitus (HCC) 03/01/2020   Gait disturbance 08/19/2019   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/19/2019   Vitamin D insufficiency 11/29/2016   Hypogonadism in male 11/01/2016   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HCC)    Hypothyroidism    Hypertension    Dyslipidemia, goal LDL below 130    MULTIPLE SCLEROSIS 08/29/2007   PROLIFERATIVE DIABETIC RETINOPATHY 08/29/2007    ONSET DATE: 06/20/2023  REFERRING DIAG: G35 (ICD-10-CM) - Multiple sclerosis (HCC) R26.9 (ICD-10-CM) - Gait disturbance  THERAPY DIAG:  Muscle weakness (generalized)  Other abnormalities of gait and mobility  Other symptoms and signs involving the nervous system  Unsteadiness on feet  Difficulty in walking, not elsewhere classified  Repeated falls  Foot drop, right  Foot drop, left  Chronic right shoulder pain  Rationale for Evaluation and Treatment: Rehabilitation  SUBJECTIVE:  SUBJECTIVE STATEMENT: Pt reports no new problems or changes - continues to wear AFO on LLE.  His right shoulder is a little sore when moving, but not bothersome.  No falls or near falls.  Pt accompanied by: self  PERTINENT HISTORY: vitamin D insufficiency, Type 1 IDDM w/ polyneuropathy, diabetic retinopathy, hypothyroidism, HTN,  hypercholestrolemia, h/o Rt tibia fracture, h/o Lt ankle fracture with ORIF, MS with acquired diplegia (MS diagnosis 2014/2015)  PAIN:   No pain reported on 08-05-23  Are you having pain? No - shoulders are "fine at rest"  PRECAUTIONS: Fall  RED FLAGS: None   WEIGHT BEARING RESTRICTIONS: No  FALLS: Has patient fallen in last 6 months? No  LIVING ENVIRONMENT: Lives with: lives with their partner Lives in: House/apartment Stairs: Yes: External: 5 steps; on right going up, on left going up, and can reach both Has following equipment at home: Single point cane, Walker - 2 wheeled, Wheelchair (manual), and shower chair  PLOF: Requires assistive device for independence and Needs assistance with homemaking  PATIENT GOALS: "I wanna learn how to walk and my feet not hit the ground"   OBJECTIVE:  Note: Objective measures were completed at Evaluation unless otherwise noted.  DIAGNOSTIC FINDINGS: MRI of brain from 07/2017  IMPRESSION: 1. Diffuse periventricular T2 hyperintensities with involving the corpus callosum are stable from the prior exam. 2. Scattered subcortical and upper cervical spine lesions are also stable. 3. No new lesions, restricted diffusion, or focal enhancement to suggest progression of disease. 4. No acute intracranial abnormality.  *Updated imaging ordered by Dr. Epimenio Foot (MRI of brain and C-spine)   COGNITION: Overall cognitive status: Within functional limits for tasks assessed   SENSATION: Pt reports his LUE was numb a few years ago due to a blood clot, is numb occasionally now but not consistently.    MUSCLE TONE: Increased extensor tone of LLE >RLE but ROM and coordination limited bilaterally due to extensor tone/spasticity    POSTURE: rounded shoulders, forward head, and weight shift right  LOWER EXTREMITY ROM:   Limited by extensor tone/spasticity   Active  Right Eval Left Eval  Hip flexion    Hip extension    Hip abduction    Hip adduction     Hip internal rotation    Hip external rotation    Knee flexion    Knee extension    Ankle dorsiflexion    Ankle plantarflexion    Ankle inversion    Ankle eversion     (Blank rows = not tested)  LOWER EXTREMITY MMT:  Tested in seated position   MMT Right Eval Left Eval  Hip flexion 1 1  Hip extension    Hip abduction 4- 4-  Hip adduction    Hip internal rotation    Hip external rotation    Knee flexion 3 3+  Knee extension 3+ 1  Ankle dorsiflexion  0  Ankle plantarflexion    Ankle inversion    Ankle eversion    (Blank rows = not tested)  BED MOBILITY:  Independent per pt   TRANSFERS: Assistive device utilized: Environmental consultant - 2 wheeled  Sit to stand: SBA Stand to sit: SBA Heavy reliance on BUEs to essentially "throw" himself out of chair, wide BOS, unable to obtain full hip extension at top of stand or let go of chair if RW not in front of him    GAIT: Gait pattern: step to pattern, decreased step length- Left, decreased stride length, decreased hip/knee flexion- Right, decreased hip/knee flexion-  Left, decreased ankle dorsiflexion- Right, decreased ankle dorsiflexion- Left, lateral hip instability, trunk flexed, narrow BOS, and poor foot clearance- Left Distance walked: Various clinic distances  Assistive device utilized: Walker - 2 wheeled Level of assistance: SBA Comments: Pt required 9 minutes to ambulate into treatment room (~30') , frequently stopping to rest as his "legs lock up". Noted excessive inversion of L ankle as well as scissoring of LLE. Pt relying heavily on BUEs to perform more of a swing-to pattern.   FUNCTIONAL TESTS:                                                                                                                                 TREATMENT:  08-12-23 -8lb bilateral arnold press x12 > repeated without weight for general shoulder mobility  -8lb elevated bicep curl x12 each UE -12lb bilateral shoulder flexion x15 > x15 w/ pt in  semi-weight bearing positioning elevated on mat table -8lb slam ball off edge of elevated mat table (27") x20 > 10lb slam ball push from elevated mat table  -Pt uses reciprocal gait to enter and exit gym using 2WW SBA, single right lateral LOB (mild) at end of distance into gym, pt self-recovers using BUE.  PT looked at walker to assess right rail screw adjustment, but PT unable to adjust as this is more of a rivet style fixture than a screw.  He advised PT he has a new 2WW that he can use already at home.   HEP - Medbridge Access Code: [email or web address] URL: [email or web address] Date: 07/22/2023 Prepared by: Maebelle Munroe  Exercises - Seated Shoulder Abduction and External Rotation AROM  - 1 x daily - 7 x weekly - 1 sets - 2-3 reps - 10-15 hold - Seated Lifting Hands Behind Back  - 1 x daily - 7 x weekly - 1 sets - 2-3 reps - 10-15 hold - Doorway Pec Stretch at 90 Degrees Abduction  - 1 x daily - 7 x weekly - 1 sets - 2-3 reps - 10-15 secs hold - Standing Shoulder Flexion with Dumbbells  - 1 x daily - 7 x weekly - 2 sets - 10 reps - Seated Single Arm Shoulder Horizontal Abduction - Thumb Up  - 1 x daily - 7 x weekly - 2 sets - 10 reps - Seated Shoulder Extension  - 1 x daily - 7 x weekly - 2 sets - 10 reps  PATIENT EDUCATION: Education details: Medbridge; discussed postural control and contributions to rounded posture in sitting - how to correct over time by making sure core and shoulder blades engaged correctly.  Discussed anatomy of shoulder and possible contributions to shoulder discomfort with certain motions - encouraged talking to PCP about imaging if pain worsens or function changes. Person educated: Patient Education method: Explanation Education comprehension: needs further education  HOME EXERCISE PROGRAM: To be reviewed from previous POC: [id number] and [id number]   GOALS: Goals reviewed with patient? Yes  SHORT TERM GOALS: Target date: 08/01/2023   Pt will be  independent with mobility and strength HEP for improved strength, balance and transfers.   Baseline: to be reviewed from previous POC Goal status:  Goal met 08-05-23     LONG TERM GOALS: Target date: 08/15/2023   Pt will be compliant with use of WC and bilateral AFOs at all times for reduced fall risk, energy efficiency and proper body mechanics.  Baseline: pt not using AD at home or wearing AFOs  Goal status: INITIAL   ASSESSMENT:  CLINICAL IMPRESSION: PT worked to address shoulder stability with progression to semi-weight bearing position in BLE using elevated mat surface.  His shoulder ROM is limited and he uses compensation to elevate overhead without pain.  He sometimes has discomfort with IR mobility when doing things like making the bed at home and PT encouraged him to express this to his PCP for ongoing monitoring due to heavy reliance on UE for general mobility.  No additions made to HEP today and pt is aware of goal assessment next session.    OBJECTIVE IMPAIRMENTS: Abnormal gait, decreased activity tolerance, decreased balance, decreased cognition, decreased coordination, decreased endurance, decreased knowledge of condition, decreased knowledge of use of DME, decreased mobility, difficulty walking, decreased strength, decreased safety awareness, impaired perceived functional ability, increased muscle spasms, impaired sensation, impaired tone, improper body mechanics, and pain  ACTIVITY LIMITATIONS: carrying, lifting, bending, standing, squatting, stairs, transfers, locomotion level, and caring for others  PARTICIPATION LIMITATIONS: cleaning, driving, shopping, community activity, occupation, and yard work  PERSONAL FACTORS: Behavior pattern, Education, Fitness, Past/current experiences, and 1-2 comorbidities: MS  are also affecting patient's functional outcome.   REHAB POTENTIAL: Poor Due to poor compliance w/PT education and severity of deficits   CLINICAL DECISION MAKING:  Unstable/unpredictable  EVALUATION COMPLEXITY: High  PLAN:  PT FREQUENCY: 1x/week  PT DURATION: 6 weeks  PLANNED INTERVENTIONS: 97164- PT Re-evaluation, 97110-Therapeutic exercises, 97530- Therapeutic activity, 97112- Neuromuscular re-education, 97535- Self Care, 44010- Manual therapy, [phone]- Gait training, [phone]- Orthotic Fit/training, [phone]- Aquatic Therapy, [phone]- Electrical stimulation (manual), Patient/Family education, Balance training, Joint mobilization, DME instructions, and Wheelchair mobility training  PLAN FOR NEXT SESSION:  Cont LE stretching/AROM, gait training, shoulder isometrics; quadruped activities, periscapular mobility and postural work; assess LTGs - discharge vs making up missed visit?   Sadie Haber, PT, DPT 08/12/2023, 10:19 AM

## 2023-08-19 ENCOUNTER — Ambulatory Visit: Payer: Medicare PPO | Admitting: Physical Therapy

## 2023-08-19 ENCOUNTER — Encounter: Payer: Self-pay | Admitting: Physical Therapy

## 2023-08-19 DIAGNOSIS — R29818 Other symptoms and signs involving the nervous system: Secondary | ICD-10-CM | POA: Diagnosis not present

## 2023-08-19 DIAGNOSIS — M25511 Pain in right shoulder: Secondary | ICD-10-CM | POA: Diagnosis not present

## 2023-08-19 DIAGNOSIS — M6281 Muscle weakness (generalized): Secondary | ICD-10-CM | POA: Diagnosis not present

## 2023-08-19 DIAGNOSIS — R296 Repeated falls: Secondary | ICD-10-CM | POA: Diagnosis not present

## 2023-08-19 DIAGNOSIS — G8929 Other chronic pain: Secondary | ICD-10-CM | POA: Diagnosis not present

## 2023-08-19 DIAGNOSIS — R262 Difficulty in walking, not elsewhere classified: Secondary | ICD-10-CM | POA: Diagnosis not present

## 2023-08-19 DIAGNOSIS — R2681 Unsteadiness on feet: Secondary | ICD-10-CM | POA: Diagnosis not present

## 2023-08-19 DIAGNOSIS — M21371 Foot drop, right foot: Secondary | ICD-10-CM | POA: Diagnosis not present

## 2023-08-19 DIAGNOSIS — R2689 Other abnormalities of gait and mobility: Secondary | ICD-10-CM | POA: Diagnosis not present

## 2023-08-19 NOTE — Therapy (Signed)
 OUTPATIENT PHYSICAL THERAPY NEURO TREATMENT NOTE/DISCHARGE SUMMARY   Patient Name: Johnathan Ross MRN: [id number] DOB:1970/07/22, 53 y.o., male Today's Date: 08/19/2023   PCP: Etta Grandchild, MD  REFERRING PROVIDER: Asa Lente, MD  END OF SESSION:  PT End of Session - 08/19/23 1406     Visit Number 6    Number of Visits 7   6/6 visits authorized   Date for PT Re-Evaluation 08/29/23    Authorization Type [employer]    PT Start Time 0930    PT Stop Time 1016    PT Time Calculation (min) 46 min    Activity Tolerance Patient tolerated treatment well    Behavior During Therapy [facility] for tasks assessed/performed                Past Medical History:  Diagnosis Date   Hyperlipidemia    Hypertension    Hypogonadism in male 11/01/2016   Hypothyroidism    Multiple sclerosis (HCC)    Neuromuscular disorder (HCC)    Proliferative diabetic retinopathy(362.02)    Type 1 diabetes mellitus (HCC) dx'd 1994   Vitamin D insufficiency 11/29/2016   Past Surgical History:  Procedure Laterality Date   EYE SURGERY Bilateral    "laser for diabetic retinopathy"   FRACTURE SURGERY     IR VENO/EXT/UNI LEFT  03/24/2020   OPEN REDUCTION INTERNAL FIXATION (ORIF) TIBIA/FIBULA FRACTURE Right 10/30/2016   ORIF ANKLE FRACTURE Left 2015   ORIF TIBIA FRACTURE Right 10/30/2016   Procedure: OPEN REDUCTION INTERNAL FIXATION (ORIF) TIBIA FIBULA FRACTURE;  Surgeon: Myrene Galas, MD;  Location: [facility] OR;  Service: Orthopedics;  Laterality: Right;   RETINAL LASER PROCEDURE Bilateral    "for diabetic retinopathy"   Patient Active Problem List   Diagnosis Date Noted   Encounter for screening for HIV 06/17/2023   Need for immunization against influenza 06/17/2023   Loud snoring 06/17/2023   Encounter for general adult medical examination with abnormal findings 08/02/2022   Screening for prostate cancer 08/01/2021   OAB (overactive bladder) 09/21/2020   Essential hypertension 09/21/2020    Thoracic outlet syndrome 05/03/2020   Controlled type 1 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 (HCC) 03/24/2020   Right epiretinal membrane 03/24/2020   Retinal exudates and deposits 03/24/2020   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   Nuclear sclerotic cataract of both eyes 03/24/2020   Gait disorder 03/23/2020   Benign prostatic hyperplasia without lower urinary tract symptoms 03/01/2020   Diabetic gastroparesis associated with type 1 diabetes mellitus (HCC) 03/01/2020   Gait disturbance 08/19/2019   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/19/2019   Vitamin D insufficiency 11/29/2016   Hypogonadism in male 11/01/2016   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HCC)    Hypothyroidism    Hypertension    Dyslipidemia, goal LDL below 130    MULTIPLE SCLEROSIS 08/29/2007   PROLIFERATIVE DIABETIC RETINOPATHY 08/29/2007    ONSET DATE: 06/20/2023  REFERRING DIAG: G35 (ICD-10-CM) - Multiple sclerosis (HCC) R26.9 (ICD-10-CM) - Gait disturbance  THERAPY DIAG:  Other abnormalities of gait and mobility  Muscle weakness (generalized)  Other symptoms and signs involving the nervous system  Rationale for Evaluation and Treatment: Rehabilitation  SUBJECTIVE:  SUBJECTIVE STATEMENT:   Pt reports he is feeling weak today - doesn't know why; states he is not wearing AFO on LLE because he had a hard time trying to get it on (even more difficulty today due to feeling weaker) - got frustrated trying to put it on so he just quit.  Pt verbalizes that today is planned discharge.  Pt asks about a Swedish knee cage - says when he had his appt at [facility] for his AFO the orthotist told him that a Swedish knee cage would prevent his knee from locking out.  Educated pt in this orthosis - pt  was shown clinic's Swedish knee cage but was too small for pt to trial.  Explained to pt that this orthosis would prevent his Lt knee from hyperextending but would not help his Lt foot to clear in swing phase of gait.  Pt verbalized understanding.    Pt accompanied by: self  PERTINENT HISTORY: vitamin D insufficiency, Type 1 IDDM w/ polyneuropathy, diabetic retinopathy, hypothyroidism, HTN, hypercholestrolemia, h/o Rt tibia fracture, h/o Lt ankle fracture with ORIF, MS with acquired diplegia (MS diagnosis 2014/2015)  PAIN:   No pain reported on 08-19-23  Are you having pain? No  PRECAUTIONS: Fall  RED FLAGS: None   WEIGHT BEARING RESTRICTIONS: No  FALLS: Has patient fallen in last 6 months? No  LIVING ENVIRONMENT: Lives with: lives with their partner Lives in: House/apartment Stairs: Yes: External: 5 steps; on right going up, on left going up, and can reach both Has following equipment at home: Single point cane, Walker - 2 wheeled, Wheelchair (manual), and shower chair  PLOF: Requires assistive device for independence and Needs assistance with homemaking  PATIENT GOALS: "I wanna learn how to walk and my feet not hit the ground"   OBJECTIVE:  Note: Objective measures were completed at Evaluation unless otherwise noted.  DIAGNOSTIC FINDINGS: MRI of brain from 07/2017  IMPRESSION: 1. Diffuse periventricular T2 hyperintensities with involving the corpus callosum are stable from the prior exam. 2. Scattered subcortical and upper cervical spine lesions are also stable. 3. No new lesions, restricted diffusion, or focal enhancement to suggest progression of disease. 4. No acute intracranial abnormality.  *Updated imaging ordered by Dr. Epimenio Foot (MRI of brain and C-spine)   COGNITION: Overall cognitive status: Within functional limits for tasks assessed   SENSATION: Pt reports his LUE was numb a few years ago due to a blood clot, is numb occasionally now but not consistently.     MUSCLE TONE: Increased extensor tone of LLE >RLE but ROM and coordination limited bilaterally due to extensor tone/spasticity    POSTURE: rounded shoulders, forward head, and weight shift right  LOWER EXTREMITY ROM:   Limited by extensor tone/spasticity   Active  Right Eval Left Eval  Hip flexion    Hip extension    Hip abduction    Hip adduction    Hip internal rotation    Hip external rotation    Knee flexion    Knee extension    Ankle dorsiflexion    Ankle plantarflexion    Ankle inversion    Ankle eversion     (Blank rows = not tested)  LOWER EXTREMITY MMT:  Tested in seated position   MMT Right Eval Left Eval  Hip flexion 1 1  Hip extension    Hip abduction 4- 4-  Hip adduction    Hip internal rotation    Hip external rotation    Knee flexion 3 3+  Knee  extension 3+ 1  Ankle dorsiflexion  0  Ankle plantarflexion    Ankle inversion    Ankle eversion    (Blank rows = not tested)  BED MOBILITY:  Independent per pt   TRANSFERS: Assistive device utilized: Environmental consultant - 2 wheeled  Sit to stand: SBA Stand to sit: SBA Heavy reliance on BUEs to essentially "throw" himself out of chair, wide BOS, unable to obtain full hip extension at top of stand or let go of chair if RW not in front of him    GAIT: Gait pattern: step to pattern, decreased step length- Left, decreased stride length, decreased hip/knee flexion- Right, decreased hip/knee flexion- Left, decreased ankle dorsiflexion- Right, decreased ankle dorsiflexion- Left, lateral hip instability, trunk flexed, narrow BOS, and poor foot clearance- Left Distance walked: Various clinic distances  Assistive device utilized: Walker - 2 wheeled Level of assistance: SBA Comments: Pt required 9 minutes to ambulate into treatment room (~30') , frequently stopping to rest as his "legs lock up". Noted excessive inversion of L ankle as well as scissoring of LLE. Pt relying heavily on BUEs to perform more of a swing-to  pattern.   FUNCTIONAL TESTS:                                                                                                                                 TREATMENT:  08-19-23   Gait: Gait from clinic lobby to gym (2nd mat table on left side) - approx. 70'- required 15" with pt amb. With reciprocal gait pattern with RW Gait pattern: step to pattern, decreased step length- Left, decreased stride length, decreased hip/knee flexion- Right, decreased hip/knee flexion- Left, decreased ankle dorsiflexion- Right, decreased ankle dorsiflexion- Left, lateral hip instability, trunk flexed, narrow BOS, and poor foot clearance- Left Distance walked: 54' with reciprocal gait pattern - frequent stops/rests needed due to extensor tone/spasticity in LLE resulting in difficulty advancing LLE in swing through Assistive device utilized: Walker - 2 wheeled Level of assistance: SBA Comments: Pt NOT wearing [place] AFO on LLE today due to difficulty/inability to don AFO at home prior to PT appt - inconsistent foot clearance achieved with brace due to Lt hip flexor and hamstring weakness and spasticity:  Pt used intermittent reciprocal gait pattern with swing through gait pattern with ambulation distance from lobby to clinic gym.    TherEx:  Bridging 5 reps Lt hip extension control exercise off edge of mat - knee held in 90 degree flexion - 10 reps with min assist for final end ROM prn to assist with clearing edge of mat Passive Lt hamstring stretching - pt's Lt foot placed on therapist's shoulder to maintain Lt knee in extended position - 30 secs static stretch x 2 reps Lt hip flexion in hooklying position with min to mod assist 10 reps Lt knee to chest - hip and knee flexion 10 reps with mod assist  Self Care: Educated pt in importance of Lt  shoulder ROM - especially internal and external rotation as pt has limited Rt shoulder active internal & external rotation  Reviewed wand exercises as previously  instructed - wand behind head and moving wand up behind back and also RUE held in 90 degree abduction- moving UE in internal and external rotation; pt stated he moved his insulin pump from the left side to the right side of his back and had some difficulty arranging it due to the limited Rt shoulder internal rotation  Reviewed LTG's and HEP - pt feels ready for D/C at this time; informed pt that he may return in future with new referral from MD   HEP - Medbridge Access Code: [email or web address] URL: [email or web address] Date: 07/22/2023 Prepared by: Maebelle Munroe  Exercises - Seated Shoulder Abduction and External Rotation AROM  - 1 x daily - 7 x weekly - 1 sets - 2-3 reps - 10-15 hold - Seated Lifting Hands Behind Back  - 1 x daily - 7 x weekly - 1 sets - 2-3 reps - 10-15 hold - Doorway Pec Stretch at 90 Degrees Abduction  - 1 x daily - 7 x weekly - 1 sets - 2-3 reps - 10-15 secs hold - Standing Shoulder Flexion with Dumbbells  - 1 x daily - 7 x weekly - 2 sets - 10 reps - Seated Single Arm Shoulder Horizontal Abduction - Thumb Up  - 1 x daily - 7 x weekly - 2 sets - 10 reps - Seated Shoulder Extension  - 1 x daily - 7 x weekly - 2 sets - 10 reps  PATIENT EDUCATION: Education details: Medbridge  Person educated: Patient Education method: Explanation Education comprehension: needs further education  HOME EXERCISE PROGRAM: To be reviewed from previous POC: [id number] and [id number]   GOALS: Goals reviewed with patient? Yes  SHORT TERM GOALS: Target date: 08/01/2023   Pt will be independent with mobility and strength HEP for improved strength, balance and transfers.   Baseline: to be reviewed from previous POC Goal status:  Goal met 08-05-23     LONG TERM GOALS: Target date: 08/15/2023   Pt will be compliant with use of WC and bilateral AFOs at all times for reduced fall risk, energy efficiency and proper body mechanics.  Baseline: pt not using AD at home or wearing AFOs  Goal  status: Partially met - pt not wearing AFO to today's session due to difficulty donning but states he has been wearing it almost daily at home; pt reports he uses wheelchair for outside mobility and usually with community mobility requiring prolonged amb. distances   ASSESSMENT:  CLINICAL IMPRESSION: Pt has partially met the 1 LTG as he is not wearing AFO on LLE to today's PT appt due to feeling increased weakness and having difficulty donning orthosis at home when getting dressed this morning.  Pt reports he usually wears the AFO at home everyday and reports he uses the wheelchair for prolonged ambulation distance.  Pt continues to have extensor tone/spasticity in bil. LE's; reciprocal gait pattern is not efficient, with pt performing vaulting paraplegic gait for increased gait speed and efficiency.  Pt is discharged due to plateau in maximizing functional progress at this time.  No further needs identified at this time.  OBJECTIVE IMPAIRMENTS: Abnormal gait, decreased activity tolerance, decreased balance, decreased cognition, decreased coordination, decreased endurance, decreased knowledge of condition, decreased knowledge of use of DME, decreased mobility, difficulty walking, decreased strength, decreased safety awareness, impaired perceived functional ability, increased muscle spasms, impaired sensation, impaired  tone, improper body mechanics, and pain  ACTIVITY LIMITATIONS: carrying, lifting, bending, standing, squatting, stairs, transfers, locomotion level, and caring for others  PARTICIPATION LIMITATIONS: cleaning, driving, shopping, community activity, occupation, and yard work  PERSONAL FACTORS: Behavior pattern, Education, Fitness, Past/current experiences, and 1-2 comorbidities: MS  are also affecting patient's functional outcome.   REHAB POTENTIAL: Poor Due to poor compliance w/PT education and severity of deficits   CLINICAL DECISION MAKING: Unstable/unpredictable  EVALUATION  COMPLEXITY: High  PLAN:  PT FREQUENCY: 1x/week  PT DURATION: 6 weeks  PLANNED INTERVENTIONS: 97164- PT Re-evaluation, 97110-Therapeutic exercises, 97530- Therapeutic activity, 97112- Neuromuscular re-education, 97535- Self Care, 47829- Manual therapy, [phone]- Gait training, [phone]- Orthotic Fit/training, [phone]- Aquatic Therapy, [phone]- Electrical stimulation (manual), Patient/Family education, Balance training, Joint mobilization, DME instructions, and Wheelchair mobility training  PLAN FOR NEXT SESSION:  D/C on 08-19-23   PHYSICAL THERAPY DISCHARGE SUMMARY  Visits from Start of Care: 6  Current functional level related to goals / functional outcomes: See above for progress towards LTG   Remaining deficits: Continued dependency with gait with pt utilizing vaulting gait pattern with swinging bil. LE's through simultaneously for increased efficiency.  Pt is able to amb. With reciprocal gait pattern with much difficulty due to extensor tone/spasticity in bil. LE's (this reciprocal gait pattern is not functional due to excessive time and energy expenditure required).    Pt has recently received AFO Leland Johns) with posterior strut but reports this AFO is very difficult to don with his shoe  Education / Equipment: Pt has been instructed in HEP for LE stretching and strengthening   Patient agrees to discharge. Patient goals were partially met. Patient is being discharged due to maximized rehab potential.       Kary Kos, PT 08/19/2023, 2:11 PM

## 2023-08-23 ENCOUNTER — Telehealth: Payer: Self-pay | Admitting: Endocrinology

## 2023-08-23 NOTE — Telephone Encounter (Signed)
 MEDICATION: Medtronic Transmitter  PHARMACY:  [facility]  HAS THE PATIENT CONTACTED THEIR PHARMACY?  Yes  IS THIS A 90 DAY SUPPLY : Yes  IS PATIENT OUT OF MEDICATION: Yes  IF NOT; HOW MUCH IS LEFT:   LAST APPOINTMENT DATE: @2 /17/2025  NEXT APPOINTMENT DATE:@5 /27/2025  DO WE HAVE YOUR PERMISSION TO LEAVE A DETAILED MESSAGE?:Yes  OTHER COMMENTS:    **Let patient know to contact pharmacy at the end of the day to make sure medication is ready. **  ** Please notify patient to allow 48-72 hours to process**  **Encourage patient to contact the pharmacy for refills or they can request refills through [facility]**

## 2023-08-23 NOTE — Telephone Encounter (Signed)
Order submitted via parachute

## 2023-08-25 ENCOUNTER — Encounter (HOSPITAL_COMMUNITY): Payer: Self-pay

## 2023-08-25 ENCOUNTER — Emergency Department (HOSPITAL_COMMUNITY)

## 2023-08-25 ENCOUNTER — Emergency Department (HOSPITAL_COMMUNITY): Admission: EM | Admit: 2023-08-25 | Discharge: 2023-08-25 | Disposition: A | Discharge status: Home / Self Care

## 2023-08-25 ENCOUNTER — Other Ambulatory Visit: Payer: Self-pay

## 2023-08-25 DIAGNOSIS — Z4682 Encounter for fitting and adjustment of non-vascular catheter: Secondary | ICD-10-CM | POA: Diagnosis not present

## 2023-08-25 DIAGNOSIS — R41840 Attention and concentration deficit: Secondary | ICD-10-CM | POA: Diagnosis not present

## 2023-08-25 DIAGNOSIS — J189 Pneumonia, unspecified organism: Secondary | ICD-10-CM | POA: Diagnosis present

## 2023-08-25 DIAGNOSIS — I4729 Other ventricular tachycardia: Secondary | ICD-10-CM | POA: Diagnosis present

## 2023-08-25 DIAGNOSIS — I130 Hypertensive heart and chronic kidney disease with heart failure and stage 1 through stage 4 chronic kidney disease, or unspecified chronic kidney disease: Secondary | ICD-10-CM | POA: Diagnosis not present

## 2023-08-25 DIAGNOSIS — K7200 Acute and subacute hepatic failure without coma: Secondary | ICD-10-CM | POA: Diagnosis not present

## 2023-08-25 DIAGNOSIS — E8721 Acute metabolic acidosis: Secondary | ICD-10-CM | POA: Diagnosis not present

## 2023-08-25 DIAGNOSIS — N179 Acute kidney failure, unspecified: Secondary | ICD-10-CM | POA: Diagnosis not present

## 2023-08-25 DIAGNOSIS — I951 Orthostatic hypotension: Secondary | ICD-10-CM | POA: Diagnosis not present

## 2023-08-25 DIAGNOSIS — Z794 Long term (current) use of insulin: Secondary | ICD-10-CM | POA: Diagnosis not present

## 2023-08-25 DIAGNOSIS — G9340 Encephalopathy, unspecified: Secondary | ICD-10-CM | POA: Diagnosis present

## 2023-08-25 DIAGNOSIS — J9600 Acute respiratory failure, unspecified whether with hypoxia or hypercapnia: Secondary | ICD-10-CM | POA: Diagnosis not present

## 2023-08-25 DIAGNOSIS — G35 Multiple sclerosis: Secondary | ICD-10-CM | POA: Diagnosis present

## 2023-08-25 DIAGNOSIS — R404 Transient alteration of awareness: Secondary | ICD-10-CM | POA: Diagnosis not present

## 2023-08-25 DIAGNOSIS — I10 Essential (primary) hypertension: Secondary | ICD-10-CM | POA: Insufficient documentation

## 2023-08-25 DIAGNOSIS — Z79899 Other long term (current) drug therapy: Secondary | ICD-10-CM | POA: Insufficient documentation

## 2023-08-25 DIAGNOSIS — E119 Type 2 diabetes mellitus without complications: Secondary | ICD-10-CM | POA: Diagnosis not present

## 2023-08-25 DIAGNOSIS — N189 Chronic kidney disease, unspecified: Secondary | ICD-10-CM | POA: Diagnosis not present

## 2023-08-25 DIAGNOSIS — J690 Pneumonitis due to inhalation of food and vomit: Secondary | ICD-10-CM | POA: Diagnosis not present

## 2023-08-25 DIAGNOSIS — R0602 Shortness of breath: Secondary | ICD-10-CM | POA: Diagnosis not present

## 2023-08-25 DIAGNOSIS — G8220 Paraplegia, unspecified: Secondary | ICD-10-CM | POA: Diagnosis not present

## 2023-08-25 DIAGNOSIS — E8809 Other disorders of plasma-protein metabolism, not elsewhere classified: Secondary | ICD-10-CM | POA: Diagnosis present

## 2023-08-25 DIAGNOSIS — R002 Palpitations: Secondary | ICD-10-CM | POA: Diagnosis not present

## 2023-08-25 DIAGNOSIS — I2510 Atherosclerotic heart disease of native coronary artery without angina pectoris: Secondary | ICD-10-CM | POA: Diagnosis not present

## 2023-08-25 DIAGNOSIS — I5021 Acute systolic (congestive) heart failure: Secondary | ICD-10-CM | POA: Diagnosis not present

## 2023-08-25 DIAGNOSIS — I428 Other cardiomyopathies: Secondary | ICD-10-CM | POA: Diagnosis present

## 2023-08-25 DIAGNOSIS — E039 Hypothyroidism, unspecified: Secondary | ICD-10-CM | POA: Diagnosis present

## 2023-08-25 DIAGNOSIS — E876 Hypokalemia: Secondary | ICD-10-CM | POA: Diagnosis not present

## 2023-08-25 DIAGNOSIS — J9690 Respiratory failure, unspecified, unspecified whether with hypoxia or hypercapnia: Secondary | ICD-10-CM | POA: Diagnosis not present

## 2023-08-25 DIAGNOSIS — G931 Anoxic brain damage, not elsewhere classified: Secondary | ICD-10-CM | POA: Diagnosis not present

## 2023-08-25 DIAGNOSIS — J9601 Acute respiratory failure with hypoxia: Secondary | ICD-10-CM | POA: Diagnosis not present

## 2023-08-25 DIAGNOSIS — R9082 White matter disease, unspecified: Secondary | ICD-10-CM | POA: Diagnosis not present

## 2023-08-25 DIAGNOSIS — E103599 Type 1 diabetes mellitus with proliferative diabetic retinopathy without macular edema, unspecified eye: Secondary | ICD-10-CM | POA: Diagnosis present

## 2023-08-25 DIAGNOSIS — R410 Disorientation, unspecified: Secondary | ICD-10-CM | POA: Diagnosis not present

## 2023-08-25 DIAGNOSIS — Z8674 Personal history of sudden cardiac arrest: Secondary | ICD-10-CM | POA: Diagnosis not present

## 2023-08-25 DIAGNOSIS — R6521 Severe sepsis with septic shock: Secondary | ICD-10-CM | POA: Diagnosis present

## 2023-08-25 DIAGNOSIS — E1069 Type 1 diabetes mellitus with other specified complication: Secondary | ICD-10-CM | POA: Diagnosis not present

## 2023-08-25 DIAGNOSIS — E872 Acidosis, unspecified: Secondary | ICD-10-CM | POA: Diagnosis present

## 2023-08-25 DIAGNOSIS — N183 Chronic kidney disease, stage 3 unspecified: Secondary | ICD-10-CM | POA: Diagnosis not present

## 2023-08-25 DIAGNOSIS — I5082 Biventricular heart failure: Secondary | ICD-10-CM | POA: Diagnosis present

## 2023-08-25 DIAGNOSIS — R579 Shock, unspecified: Secondary | ICD-10-CM | POA: Diagnosis not present

## 2023-08-25 DIAGNOSIS — R918 Other nonspecific abnormal finding of lung field: Secondary | ICD-10-CM | POA: Diagnosis not present

## 2023-08-25 DIAGNOSIS — R569 Unspecified convulsions: Secondary | ICD-10-CM | POA: Diagnosis not present

## 2023-08-25 DIAGNOSIS — R5381 Other malaise: Secondary | ICD-10-CM | POA: Diagnosis not present

## 2023-08-25 DIAGNOSIS — E109 Type 1 diabetes mellitus without complications: Secondary | ICD-10-CM | POA: Diagnosis not present

## 2023-08-25 DIAGNOSIS — I4901 Ventricular fibrillation: Secondary | ICD-10-CM | POA: Diagnosis not present

## 2023-08-25 DIAGNOSIS — E1065 Type 1 diabetes mellitus with hyperglycemia: Secondary | ICD-10-CM | POA: Diagnosis not present

## 2023-08-25 DIAGNOSIS — N170 Acute kidney failure with tubular necrosis: Secondary | ICD-10-CM | POA: Diagnosis present

## 2023-08-25 DIAGNOSIS — I213 ST elevation (STEMI) myocardial infarction of unspecified site: Secondary | ICD-10-CM | POA: Diagnosis not present

## 2023-08-25 DIAGNOSIS — R7401 Elevation of levels of liver transaminase levels: Secondary | ICD-10-CM | POA: Diagnosis not present

## 2023-08-25 DIAGNOSIS — Z95810 Presence of automatic (implantable) cardiac defibrillator: Secondary | ICD-10-CM | POA: Diagnosis not present

## 2023-08-25 DIAGNOSIS — Z452 Encounter for adjustment and management of vascular access device: Secondary | ICD-10-CM | POA: Diagnosis not present

## 2023-08-25 DIAGNOSIS — J811 Chronic pulmonary edema: Secondary | ICD-10-CM | POA: Diagnosis not present

## 2023-08-25 DIAGNOSIS — F05 Delirium due to known physiological condition: Secondary | ICD-10-CM | POA: Diagnosis present

## 2023-08-25 DIAGNOSIS — I469 Cardiac arrest, cause unspecified: Secondary | ICD-10-CM | POA: Diagnosis not present

## 2023-08-25 DIAGNOSIS — G2581 Restless legs syndrome: Secondary | ICD-10-CM | POA: Diagnosis present

## 2023-08-25 DIAGNOSIS — R079 Chest pain, unspecified: Secondary | ICD-10-CM | POA: Diagnosis not present

## 2023-08-25 DIAGNOSIS — I472 Ventricular tachycardia, unspecified: Secondary | ICD-10-CM | POA: Diagnosis not present

## 2023-08-25 DIAGNOSIS — A419 Sepsis, unspecified organism: Secondary | ICD-10-CM | POA: Diagnosis not present

## 2023-08-25 DIAGNOSIS — I462 Cardiac arrest due to underlying cardiac condition: Secondary | ICD-10-CM | POA: Diagnosis present

## 2023-08-25 DIAGNOSIS — E1022 Type 1 diabetes mellitus with diabetic chronic kidney disease: Secondary | ICD-10-CM | POA: Diagnosis present

## 2023-08-25 DIAGNOSIS — R570 Cardiogenic shock: Secondary | ICD-10-CM | POA: Diagnosis not present

## 2023-08-25 DIAGNOSIS — D72829 Elevated white blood cell count, unspecified: Secondary | ICD-10-CM | POA: Diagnosis not present

## 2023-08-25 LAB — CBC
HCT: 45.7 % (ref 39.0–52.0)
Hemoglobin: 15.1 g/dL (ref 13.0–17.0)
MCH: 28.6 pg (ref 26.0–34.0)
MCHC: 33 g/dL (ref 30.0–36.0)
MCV: 86.6 fL (ref 80.0–100.0)
Platelets: 236 10*3/uL (ref 150–400)
RBC: 5.28 MIL/uL (ref 4.22–5.81)
RDW: 13.4 % (ref 11.5–15.5)
WBC: 8.5 10*3/uL (ref 4.0–10.5)
nRBC: 0 % (ref 0.0–0.2)

## 2023-08-25 LAB — COMPREHENSIVE METABOLIC PANEL WITH GFR
ALT: 22 U/L (ref 0–44)
AST: 27 U/L (ref 15–41)
Albumin: 3.8 g/dL (ref 3.5–5.0)
Alkaline Phosphatase: 73 U/L (ref 38–126)
Anion gap: 7 (ref 5–15)
BUN: 10 mg/dL (ref 6–20)
CO2: 27 mmol/L (ref 22–32)
Calcium: 9.1 mg/dL (ref 8.9–10.3)
Chloride: 102 mmol/L (ref 98–111)
Creatinine, Ser: 1.19 mg/dL (ref 0.61–1.24)
GFR, Estimated: >60 mL/min (ref >60)
Glucose, Bld: 150 mg/dL — ABNORMAL HIGH (ref 70–99)
Potassium: 4.6 mmol/L (ref 3.5–5.1)
Sodium: 136 mmol/L (ref 135–145)
Total Bilirubin: 0.6 mg/dL (ref 0.0–1.2)
Total Protein: 6.6 g/dL (ref 6.5–8.1)

## 2023-08-25 LAB — TROPONIN I (HIGH SENSITIVITY): Troponin I (High Sensitivity): 9 ng/L (ref <18)

## 2023-08-25 LAB — RESP PANEL BY RT-PCR (RSV, FLU A&B, COVID)  RVPGX2
Influenza A by PCR: NEGATIVE
Influenza B by PCR: NEGATIVE
Resp Syncytial Virus by PCR: NEGATIVE
SARS Coronavirus 2 by RT PCR: NEGATIVE

## 2023-08-25 LAB — D-DIMER, QUANTITATIVE: D-Dimer, Quant: 0.34 ug{FEU}/mL (ref 0.00–0.50)

## 2023-08-25 MED ORDER — SODIUM CHLORIDE 0.9 % IV BOLUS: 1000.0000 mL | Freq: Once | INTRAVENOUS | Status: DC

## 2023-08-25 NOTE — Discharge Instructions (Addendum)
 Please follow-up with your primary doctor.  We are also giving you a referral to cardiology.  They should call you to schedule an appointment.  Return immediately if develop fevers, chills, sudden onset headache, facial droop, unilateral weakness, chest pain, shortness of breath, passout, feel your heart racing or he develop any new or worsening symptoms that are concerning to you.

## 2023-08-25 NOTE — ED Provider Notes (Signed)
  EMERGENCY DEPARTMENT AT [facility] Provider Note   CSN: [id number] Arrival date & time: 08/25/23  1258     History  Chief Complaint  Patient presents with   Shortness of Breath    Johnathan Ross is a 53 y.o. male.  This is a 53 year old male history of MS presenting the emergency department for near syncope and palpations.  Reports having several near syncope since Friday.  Did not fully pass out.  Symptoms seemingly occur with standing or exertion.  Notes palpitations, lightheadedness and shortness of breath, seemingly improved when he sits down.  Symptoms are short-lived less than 30 seconds. this happened twice Friday, 3 times yesterday and once today.  No chest pain during the episodes.  Today he did note that he did have tunnel vision/vision went black, but he did not lose consciousness.  Did not fall did not hit his head.  States he feels normal state of health in the bed.   Shortness of Breath      Home Medications Prior to Admission medications   Medication Sig Start Date End Date Taking? Authorizing Provider  baclofen (LIORESAL) 10 MG tablet Take 1 tablet (10 mg total) by mouth 3 (three) times daily. 06/20/23   Sater, Pearletha Furl, MD  dalfampridine 10 MG TB12 Take 1 tablet (10 mg total) by mouth 2 (two) times daily. 06/20/23   Sater, Pearletha Furl, MD  Dasiglucagon HCl (ZEGALOGUE) 0.6 MG/0.6ML SOAJ Inject 1 Act into the skin daily as needed. Patient not taking: Reported on 06/20/2023 06/18/23   Etta Grandchild, MD  gabapentin (NEURONTIN) 100 MG capsule Take 1 capsule (100 mg total) by mouth 3 (three) times daily. Patient not taking: Reported on 06/20/2023 03/12/22   Corwin Levins, MD  glucose blood (CONTOUR NEXT TEST) test strip USE 2 TIMES PER DAY. E10.65 Patient not taking: Reported on 06/20/2023 08/22/21   Reather Littler, MD  levocetirizine (XYZAL) 5 MG tablet TAKE 1 TABLET BY MOUTH EVERY DAY IN THE EVENING 07/18/23   Etta Grandchild, MD  losartan (COZAAR) 50  MG tablet Take 1 tablet (50 mg total) by mouth daily. 07/23/23   Thapa, [place], MD  NOVOLOG 100 UNIT/ML injection USE UP TO 80 UNITS IN INSULIN PUMP DAILY-DX CODE E10.8 01/09/23   Reather Littler, MD  OVER THE COUNTER MEDICATION Sea moss supplement-vitamin and minerals daily Patient not taking: Reported on 06/20/2023    [provider]  silodosin (RAPAFLO) 8 MG CAPS capsule Take 1 capsule (8 mg total) by mouth daily with breakfast. Patient not taking: Reported on 06/20/2023 02/07/22   Sater, Pearletha Furl, MD  simvastatin (ZOCOR) 20 MG tablet Take 1 tablet (20 mg total) by mouth daily at 6 PM. 06/11/23   Etta Grandchild, MD  solifenacin (VESICARE) 5 MG tablet TAKE 1 TABLET (5 MG TOTAL) BY MOUTH DAILY. 11/25/22   Etta Grandchild, MD  SYNTHROID 125 MCG tablet Take 1 tablet by mouth 6 days per week. 06/21/23   Thapa, [place], MD      Allergies    Lisinopril and Ivp dye [iodinated contrast media]    Review of Systems   Review of Systems  Respiratory:  Positive for shortness of breath.     Physical Exam Updated Vital Signs BP 138/79   Pulse 75   Temp 99 F (37.2 C)   Resp 17   Ht 6\' 1"  (1.854 m)   Wt 94.8 kg   SpO2 100%   BMI 27.57 kg/m  Physical Exam Vitals and nursing note reviewed.  Constitutional:      General: He is not in acute distress.    Appearance: He is not toxic-appearing.  HENT:     Head: Normocephalic and atraumatic.  Cardiovascular:     Rate and Rhythm: Normal rate.  Pulmonary:     Effort: Pulmonary effort is normal.     Breath sounds: Normal breath sounds. No wheezing, rhonchi or rales.  Musculoskeletal:     Cervical back: Normal range of motion and neck supple.     Right lower leg: No edema.     Left lower leg: No edema.  Neurological:     Mental Status: He is alert and oriented to person, place, and time.  Psychiatric:        Mood and Affect: Mood normal.        Behavior: Behavior normal.     ED Results / Procedures / Treatments   Labs (all labs ordered are  listed, but only abnormal results are displayed) Labs Reviewed  COMPREHENSIVE METABOLIC PANEL WITH GFR - Abnormal; Notable for the following components:      Result Value   Glucose, Bld 150 (*)    All other components within normal limits  RESP PANEL BY RT-PCR (RSV, FLU A&B, COVID)  [id number]  CBC  D-DIMER, QUANTITATIVE  TROPONIN I (HIGH SENSITIVITY)    EKG EKG Interpretation Date/Time:  Sunday August 25 2023 13:08:32 EDT Ventricular Rate:  99 PR Interval:  136 QRS Duration:  70 QT Interval:  286 QTC Calculation: 367 R Axis:   41  Text Interpretation: Normal sinus rhythm Low voltage QRS Nonspecific T wave abnormality Abnormal ECG When compared with ECG of 02-Mar-2022 18:40, PREVIOUS ECG IS PRESENT Confirmed by Estanislado Pandy [phone]) on 08/25/2023 3:07:31 PM  Radiology DG Chest Port 1 View Result Date: 08/25/2023 CLINICAL DATA:  Shortness of breath EXAM: PORTABLE CHEST 1 VIEW COMPARISON:  March 02, 2022 FINDINGS: Evaluation is limited by positioning. The cardiomediastinal silhouette is unchanged in contour. No pleural effusion. No pneumothorax. No acute pleuroparenchymal abnormality. IMPRESSION: No acute cardiopulmonary abnormality. Electronically Signed   By: Meda Klinefelter M.D.   On: 08/25/2023 14:10    Procedures Procedures    Medications Ordered in ED Medications  sodium chloride 0.9 % bolus 1,000 mL (has no administration in time range)    ED Course/ Medical Decision Making/ A&P Clinical Course as of 08/25/23 1537  Sun Aug 25, 2023  1412 Multiple near syncopal episodes over the past 2 to 3 days.  Typically with exertion.  Feels normal self currently. [TY]  1526 Shared decision making with patient regarding admission versus outpatient follow-up.  Will like to follow-up outpatient.  Offered IV fluids as I strongly suspect his symptoms are secondary to orthostasis.  However he declined he would try to increase his p.o. intake over the next several days.  Will discharge with  referral to cardiology for possible Holter monitor as he does note that he feels like his heart does flutter during these episodes.  No apparent arrhythmia here. [TY]    Clinical Course User Index [TY] Coral Spikes, DO                                 Medical Decision Making This is a 53 year old male presenting emergency department for near syncopal episodes.  Is afebrile nontachycardic, slightly hypertensive.  Maintaining oxygen saturation on room air.  He is  asymptomatic currently lying in the bed.  He denies history of cardiac disease.  No prior cardiac workup.  Does have hyperlipidemia, hypertension, hypothyroid, diabetes, and MS.  He also notes history of DVT and upper extremity in the past.  Not on anticoagulation.  Per chart review has no provocative cardiac testing.  EKG today appears to be normal sinus rhythm without ST segment changes to indicate ischemia.  He has no fever tachycardia leukocytosis to suggest systemic infection.  No significant metabolic derangements.  No AKI to suggest volume depletion.  No transaminitis to suggest hepatobiliary disease.  Given his shortness of breath concern for possible COVID.  Flu/COVID/RSV negative.  Chest x-ray without pneumonia pneumothorax my independent interpretation.  Does have a history of DVT, therefore concern for possible PE however D-dimer is negative.  Troponin in process, however low suspicion for acute coronary syndrome.  Symptoms seemingly sound like orthostatic as the symptoms are exacerbated when he gets up or exerts himself.  Amount and/or Complexity of Data Reviewed Independent Historian:     Details: Spouse notes 6 episodes total External Data Reviewed:     Details: See above Labs: ordered.    Details: See above Radiology: ordered and independent interpretation performed.    Details: See above ECG/medicine tests: independent interpretation performed.  Risk Decision regarding hospitalization.         Final Clinical  Impression(s) / ED Diagnoses Final diagnoses:  Orthostasis  Palpitations    Rx / DC Orders ED Discharge Orders          Ordered    Ambulatory referral to Cardiology       Comments: If you have not heard from the Cardiology office within the next 72 hours please call [phone].   08/25/23 1528              Coral Spikes, DO 08/25/23 1537

## 2023-08-25 NOTE — ED Triage Notes (Signed)
 Pt c/o SOB and near [id number].

## 2023-08-26 ENCOUNTER — Inpatient Hospital Stay (HOSPITAL_COMMUNITY)

## 2023-08-26 ENCOUNTER — Emergency Department (HOSPITAL_COMMUNITY)

## 2023-08-26 ENCOUNTER — Encounter (HOSPITAL_COMMUNITY)
Admission: EM | Disposition: A | Discharge status: Rehabilitation Facility | Payer: Self-pay | Source: Home / Self Care | Attending: Internal Medicine

## 2023-08-26 ENCOUNTER — Inpatient Hospital Stay (HOSPITAL_COMMUNITY)
Admission: EM | Admit: 2023-08-26 | Discharge: 2023-09-03 | DRG: 275 | Disposition: A | Discharge status: Rehabilitation Facility | Attending: Internal Medicine | Admitting: Internal Medicine

## 2023-08-26 DIAGNOSIS — G9340 Encephalopathy, unspecified: Secondary | ICD-10-CM | POA: Diagnosis present

## 2023-08-26 DIAGNOSIS — N182 Chronic kidney disease, stage 2 (mild): Secondary | ICD-10-CM | POA: Diagnosis present

## 2023-08-26 DIAGNOSIS — R0602 Shortness of breath: Secondary | ICD-10-CM | POA: Diagnosis not present

## 2023-08-26 DIAGNOSIS — R6521 Severe sepsis with septic shock: Secondary | ICD-10-CM | POA: Diagnosis present

## 2023-08-26 DIAGNOSIS — R41840 Attention and concentration deficit: Secondary | ICD-10-CM | POA: Diagnosis not present

## 2023-08-26 DIAGNOSIS — J811 Chronic pulmonary edema: Secondary | ICD-10-CM | POA: Diagnosis not present

## 2023-08-26 DIAGNOSIS — E876 Hypokalemia: Secondary | ICD-10-CM

## 2023-08-26 DIAGNOSIS — R579 Shock, unspecified: Secondary | ICD-10-CM | POA: Diagnosis not present

## 2023-08-26 DIAGNOSIS — E119 Type 2 diabetes mellitus without complications: Secondary | ICD-10-CM | POA: Diagnosis not present

## 2023-08-26 DIAGNOSIS — R404 Transient alteration of awareness: Secondary | ICD-10-CM | POA: Diagnosis not present

## 2023-08-26 DIAGNOSIS — I4729 Other ventricular tachycardia: Secondary | ICD-10-CM | POA: Diagnosis present

## 2023-08-26 DIAGNOSIS — E109 Type 1 diabetes mellitus without complications: Secondary | ICD-10-CM | POA: Diagnosis not present

## 2023-08-26 DIAGNOSIS — N189 Chronic kidney disease, unspecified: Secondary | ICD-10-CM | POA: Diagnosis not present

## 2023-08-26 DIAGNOSIS — F40240 Claustrophobia: Secondary | ICD-10-CM | POA: Diagnosis present

## 2023-08-26 DIAGNOSIS — R569 Unspecified convulsions: Secondary | ICD-10-CM | POA: Diagnosis not present

## 2023-08-26 DIAGNOSIS — Z8674 Personal history of sudden cardiac arrest: Secondary | ICD-10-CM | POA: Diagnosis not present

## 2023-08-26 DIAGNOSIS — E103553 Type 1 diabetes mellitus with stable proliferative diabetic retinopathy, bilateral: Secondary | ICD-10-CM

## 2023-08-26 DIAGNOSIS — I5021 Acute systolic (congestive) heart failure: Secondary | ICD-10-CM | POA: Diagnosis not present

## 2023-08-26 DIAGNOSIS — D72828 Other elevated white blood cell count: Secondary | ICD-10-CM | POA: Diagnosis not present

## 2023-08-26 DIAGNOSIS — R918 Other nonspecific abnormal finding of lung field: Secondary | ICD-10-CM | POA: Diagnosis not present

## 2023-08-26 DIAGNOSIS — G2581 Restless legs syndrome: Secondary | ICD-10-CM | POA: Diagnosis present

## 2023-08-26 DIAGNOSIS — I4901 Ventricular fibrillation: Secondary | ICD-10-CM | POA: Diagnosis present

## 2023-08-26 DIAGNOSIS — K7200 Acute and subacute hepatic failure without coma: Secondary | ICD-10-CM | POA: Diagnosis present

## 2023-08-26 DIAGNOSIS — G8220 Paraplegia, unspecified: Secondary | ICD-10-CM | POA: Diagnosis not present

## 2023-08-26 DIAGNOSIS — G931 Anoxic brain damage, not elsewhere classified: Secondary | ICD-10-CM | POA: Diagnosis not present

## 2023-08-26 DIAGNOSIS — Z79899 Other long term (current) drug therapy: Secondary | ICD-10-CM

## 2023-08-26 DIAGNOSIS — E8809 Other disorders of plasma-protein metabolism, not elsewhere classified: Secondary | ICD-10-CM | POA: Diagnosis present

## 2023-08-26 DIAGNOSIS — Z95810 Presence of automatic (implantable) cardiac defibrillator: Secondary | ICD-10-CM | POA: Diagnosis not present

## 2023-08-26 DIAGNOSIS — N170 Acute kidney failure with tubular necrosis: Secondary | ICD-10-CM | POA: Diagnosis present

## 2023-08-26 DIAGNOSIS — T380X5A Adverse effect of glucocorticoids and synthetic analogues, initial encounter: Secondary | ICD-10-CM | POA: Diagnosis not present

## 2023-08-26 DIAGNOSIS — E872 Acidosis, unspecified: Secondary | ICD-10-CM | POA: Diagnosis present

## 2023-08-26 DIAGNOSIS — E663 Overweight: Secondary | ICD-10-CM | POA: Diagnosis present

## 2023-08-26 DIAGNOSIS — J189 Pneumonia, unspecified organism: Secondary | ICD-10-CM | POA: Diagnosis present

## 2023-08-26 DIAGNOSIS — I469 Cardiac arrest, cause unspecified: Principal | ICD-10-CM

## 2023-08-26 DIAGNOSIS — Z794 Long term (current) use of insulin: Secondary | ICD-10-CM

## 2023-08-26 DIAGNOSIS — R570 Cardiogenic shock: Secondary | ICD-10-CM | POA: Diagnosis present

## 2023-08-26 DIAGNOSIS — I428 Other cardiomyopathies: Secondary | ICD-10-CM | POA: Diagnosis present

## 2023-08-26 DIAGNOSIS — J9601 Acute respiratory failure with hypoxia: Secondary | ICD-10-CM

## 2023-08-26 DIAGNOSIS — I472 Ventricular tachycardia, unspecified: Secondary | ICD-10-CM | POA: Diagnosis not present

## 2023-08-26 DIAGNOSIS — Z79620 Long term (current) use of immunosuppressive biologic: Secondary | ICD-10-CM

## 2023-08-26 DIAGNOSIS — E8721 Acute metabolic acidosis: Secondary | ICD-10-CM

## 2023-08-26 DIAGNOSIS — I493 Ventricular premature depolarization: Secondary | ICD-10-CM | POA: Diagnosis present

## 2023-08-26 DIAGNOSIS — Z9641 Presence of insulin pump (external) (internal): Secondary | ICD-10-CM | POA: Diagnosis present

## 2023-08-26 DIAGNOSIS — Z7409 Other reduced mobility: Secondary | ICD-10-CM | POA: Diagnosis present

## 2023-08-26 DIAGNOSIS — J9600 Acute respiratory failure, unspecified whether with hypoxia or hypercapnia: Secondary | ICD-10-CM | POA: Diagnosis not present

## 2023-08-26 DIAGNOSIS — R413 Other amnesia: Secondary | ICD-10-CM | POA: Diagnosis present

## 2023-08-26 DIAGNOSIS — Z91041 Radiographic dye allergy status: Secondary | ICD-10-CM

## 2023-08-26 DIAGNOSIS — N179 Acute kidney failure, unspecified: Secondary | ICD-10-CM

## 2023-08-26 DIAGNOSIS — J9690 Respiratory failure, unspecified, unspecified whether with hypoxia or hypercapnia: Secondary | ICD-10-CM | POA: Diagnosis not present

## 2023-08-26 DIAGNOSIS — Z833 Family history of diabetes mellitus: Secondary | ICD-10-CM

## 2023-08-26 DIAGNOSIS — R5381 Other malaise: Secondary | ICD-10-CM | POA: Diagnosis not present

## 2023-08-26 DIAGNOSIS — R7401 Elevation of levels of liver transaminase levels: Secondary | ICD-10-CM

## 2023-08-26 DIAGNOSIS — E1065 Type 1 diabetes mellitus with hyperglycemia: Secondary | ICD-10-CM | POA: Diagnosis not present

## 2023-08-26 DIAGNOSIS — I462 Cardiac arrest due to underlying cardiac condition: Secondary | ICD-10-CM | POA: Diagnosis present

## 2023-08-26 DIAGNOSIS — Z6829 Body mass index (BMI) 29.0-29.9, adult: Secondary | ICD-10-CM

## 2023-08-26 DIAGNOSIS — I5082 Biventricular heart failure: Secondary | ICD-10-CM | POA: Diagnosis present

## 2023-08-26 DIAGNOSIS — Z79890 Hormone replacement therapy (postmenopausal): Secondary | ICD-10-CM

## 2023-08-26 DIAGNOSIS — E039 Hypothyroidism, unspecified: Secondary | ICD-10-CM | POA: Diagnosis present

## 2023-08-26 DIAGNOSIS — Z4682 Encounter for fitting and adjustment of non-vascular catheter: Secondary | ICD-10-CM | POA: Diagnosis not present

## 2023-08-26 DIAGNOSIS — J690 Pneumonitis due to inhalation of food and vomit: Secondary | ICD-10-CM | POA: Diagnosis present

## 2023-08-26 DIAGNOSIS — I130 Hypertensive heart and chronic kidney disease with heart failure and stage 1 through stage 4 chronic kidney disease, or unspecified chronic kidney disease: Secondary | ICD-10-CM | POA: Diagnosis present

## 2023-08-26 DIAGNOSIS — I2510 Atherosclerotic heart disease of native coronary artery without angina pectoris: Secondary | ICD-10-CM | POA: Diagnosis not present

## 2023-08-26 DIAGNOSIS — E1069 Type 1 diabetes mellitus with other specified complication: Secondary | ICD-10-CM | POA: Diagnosis not present

## 2023-08-26 DIAGNOSIS — E1022 Type 1 diabetes mellitus with diabetic chronic kidney disease: Secondary | ICD-10-CM | POA: Diagnosis present

## 2023-08-26 DIAGNOSIS — E103599 Type 1 diabetes mellitus with proliferative diabetic retinopathy without macular edema, unspecified eye: Secondary | ICD-10-CM | POA: Diagnosis present

## 2023-08-26 DIAGNOSIS — D72829 Elevated white blood cell count, unspecified: Secondary | ICD-10-CM | POA: Diagnosis not present

## 2023-08-26 DIAGNOSIS — R079 Chest pain, unspecified: Secondary | ICD-10-CM | POA: Diagnosis not present

## 2023-08-26 DIAGNOSIS — N183 Chronic kidney disease, stage 3 unspecified: Secondary | ICD-10-CM | POA: Diagnosis not present

## 2023-08-26 DIAGNOSIS — G35 Multiple sclerosis: Secondary | ICD-10-CM | POA: Diagnosis present

## 2023-08-26 DIAGNOSIS — Z452 Encounter for adjustment and management of vascular access device: Secondary | ICD-10-CM | POA: Diagnosis not present

## 2023-08-26 DIAGNOSIS — F05 Delirium due to known physiological condition: Secondary | ICD-10-CM | POA: Diagnosis present

## 2023-08-26 DIAGNOSIS — E785 Hyperlipidemia, unspecified: Secondary | ICD-10-CM | POA: Diagnosis present

## 2023-08-26 DIAGNOSIS — R296 Repeated falls: Secondary | ICD-10-CM | POA: Diagnosis present

## 2023-08-26 DIAGNOSIS — Z888 Allergy status to other drugs, medicaments and biological substances status: Secondary | ICD-10-CM

## 2023-08-26 DIAGNOSIS — G4700 Insomnia, unspecified: Secondary | ICD-10-CM | POA: Diagnosis present

## 2023-08-26 DIAGNOSIS — A419 Sepsis, unspecified organism: Secondary | ICD-10-CM | POA: Diagnosis present

## 2023-08-26 DIAGNOSIS — I213 ST elevation (STEMI) myocardial infarction of unspecified site: Secondary | ICD-10-CM | POA: Diagnosis not present

## 2023-08-26 HISTORY — PX: RIGHT/LEFT HEART CATH AND CORONARY ANGIOGRAPHY: CATH118266

## 2023-08-26 LAB — GLUCOSE, CAPILLARY
Glucose-Capillary: 183 mg/dL — ABNORMAL HIGH (ref 70–99)
Glucose-Capillary: 316 mg/dL — ABNORMAL HIGH (ref 70–99)
Glucose-Capillary: 311 mg/dL — ABNORMAL HIGH (ref 70–99)
Glucose-Capillary: 318 mg/dL — ABNORMAL HIGH (ref 70–99)
Glucose-Capillary: 349 mg/dL — ABNORMAL HIGH (ref 70–99)
Glucose-Capillary: 314 mg/dL — ABNORMAL HIGH (ref 70–99)
Glucose-Capillary: 282 mg/dL — ABNORMAL HIGH (ref 70–99)
Glucose-Capillary: 217 mg/dL — ABNORMAL HIGH (ref 70–99)
Glucose-Capillary: 205 mg/dL — ABNORMAL HIGH (ref 70–99)
Glucose-Capillary: 192 mg/dL — ABNORMAL HIGH (ref 70–99)
Glucose-Capillary: 153 mg/dL — ABNORMAL HIGH (ref 70–99)
Glucose-Capillary: 191 mg/dL — ABNORMAL HIGH (ref 70–99)

## 2023-08-26 LAB — POCT I-STAT EG7
Acid-base deficit: 6 mmol/L — ABNORMAL HIGH (ref 0.0–2.0)
Acid-base deficit: 7 mmol/L — ABNORMAL HIGH (ref 0.0–2.0)
Bicarbonate: 19.5 mmol/L — ABNORMAL LOW (ref 20.0–28.0)
Bicarbonate: 19.2 mmol/L — ABNORMAL LOW (ref 20.0–28.0)
Calcium, Ion: 1.14 mmol/L — ABNORMAL LOW (ref 1.15–1.40)
Calcium, Ion: 1.14 mmol/L — ABNORMAL LOW (ref 1.15–1.40)
HCT: 46 % (ref 39.0–52.0)
HCT: 46 % (ref 39.0–52.0)
Hemoglobin: 15.6 g/dL (ref 13.0–17.0)
Hemoglobin: 15.6 g/dL (ref 13.0–17.0)
O2 Saturation: 68 %
O2 Saturation: 66 %
Potassium: 4.1 mmol/L (ref 3.5–5.1)
Potassium: 4.1 mmol/L (ref 3.5–5.1)
Sodium: 136 mmol/L (ref 135–145)
Sodium: 136 mmol/L (ref 135–145)
TCO2: 21 mmol/L — ABNORMAL LOW (ref 22–32)
TCO2: 20 mmol/L — ABNORMAL LOW (ref 22–32)
pCO2, Ven: 38.2 mmHg — ABNORMAL LOW (ref 44–60)
pCO2, Ven: 38.1 mmHg — ABNORMAL LOW (ref 44–60)
pH, Ven: 7.316 (ref 7.25–7.43)
pH, Ven: 7.31 (ref 7.25–7.43)
pO2, Ven: 38 mmHg (ref 32–45)
pO2, Ven: 37 mmHg (ref 32–45)

## 2023-08-26 LAB — POCT I-STAT 7, (LYTES, BLD GAS, ICA,H+H)
Acid-base deficit: 6 mmol/L — ABNORMAL HIGH (ref 0.0–2.0)
Acid-base deficit: 8 mmol/L — ABNORMAL HIGH (ref 0.0–2.0)
Acid-base deficit: 9 mmol/L — ABNORMAL HIGH (ref 0.0–2.0)
Acid-base deficit: 7 mmol/L — ABNORMAL HIGH (ref 0.0–2.0)
Bicarbonate: 20 mmol/L (ref 20.0–28.0)
Bicarbonate: 20 mmol/L (ref 20.0–28.0)
Bicarbonate: 17.1 mmol/L — ABNORMAL LOW (ref 20.0–28.0)
Bicarbonate: 17.3 mmol/L — ABNORMAL LOW (ref 20.0–28.0)
Calcium, Ion: 1.08 mmol/L — ABNORMAL LOW (ref 1.15–1.40)
Calcium, Ion: 1.1 mmol/L — ABNORMAL LOW (ref 1.15–1.40)
Calcium, Ion: 1.02 mmol/L — ABNORMAL LOW (ref 1.15–1.40)
Calcium, Ion: 0.95 mmol/L — ABNORMAL LOW (ref 1.15–1.40)
HCT: 46 % (ref 39.0–52.0)
HCT: 49 % (ref 39.0–52.0)
HCT: 48 % (ref 39.0–52.0)
HCT: 42 % (ref 39.0–52.0)
Hemoglobin: 15.6 g/dL (ref 13.0–17.0)
Hemoglobin: 16.7 g/dL (ref 13.0–17.0)
Hemoglobin: 16.3 g/dL (ref 13.0–17.0)
Hemoglobin: 14.3 g/dL (ref 13.0–17.0)
O2 Saturation: 98 %
O2 Saturation: 98 %
O2 Saturation: 99 %
O2 Saturation: 100 %
Patient temperature: 35.8
Patient temperature: 36.1
Patient temperature: 36.8
Potassium: 4.6 mmol/L (ref 3.5–5.1)
Potassium: 5.3 mmol/L — ABNORMAL HIGH (ref 3.5–5.1)
Potassium: 6.9 mmol/L (ref 3.5–5.1)
Potassium: 3.6 mmol/L (ref 3.5–5.1)
Sodium: 135 mmol/L (ref 135–145)
Sodium: 133 mmol/L — ABNORMAL LOW (ref 135–145)
Sodium: 129 mmol/L — ABNORMAL LOW (ref 135–145)
Sodium: 140 mmol/L (ref 135–145)
TCO2: 21 mmol/L — ABNORMAL LOW (ref 22–32)
TCO2: 21 mmol/L — ABNORMAL LOW (ref 22–32)
TCO2: 18 mmol/L — ABNORMAL LOW (ref 22–32)
TCO2: 18 mmol/L — ABNORMAL LOW (ref 22–32)
pCO2 arterial: 38.3 mmHg (ref 32–48)
pCO2 arterial: 46.8 mmHg (ref 32–48)
pCO2 arterial: 35.9 mmHg (ref 32–48)
pCO2 arterial: 29.8 mmHg — ABNORMAL LOW (ref 32–48)
pH, Arterial: 7.319 — ABNORMAL LOW (ref 7.35–7.45)
pH, Arterial: 7.233 — ABNORMAL LOW (ref 7.35–7.45)
pH, Arterial: 7.285 — ABNORMAL LOW (ref 7.35–7.45)
pH, Arterial: 7.371 (ref 7.35–7.45)
pO2, Arterial: 108 mmHg (ref 83–108)
pO2, Arterial: 132 mmHg — ABNORMAL HIGH (ref 83–108)
pO2, Arterial: 132 mmHg — ABNORMAL HIGH (ref 83–108)
pO2, Arterial: 326 mmHg — ABNORMAL HIGH (ref 83–108)

## 2023-08-26 LAB — BASIC METABOLIC PANEL WITH GFR
Anion gap: 12 (ref 5–15)
BUN: 11 mg/dL (ref 6–20)
CO2: 17 mmol/L — ABNORMAL LOW (ref 22–32)
Calcium: 7.6 mg/dL — ABNORMAL LOW (ref 8.9–10.3)
Chloride: 105 mmol/L (ref 98–111)
Creatinine, Ser: 1.16 mg/dL (ref 0.61–1.24)
GFR, Estimated: >60 mL/min (ref >60)
Glucose, Bld: 283 mg/dL — ABNORMAL HIGH (ref 70–99)
Potassium: 4.7 mmol/L (ref 3.5–5.1)
Sodium: 134 mmol/L — ABNORMAL LOW (ref 135–145)

## 2023-08-26 LAB — CBC
HCT: 52.4 % — ABNORMAL HIGH (ref 39.0–52.0)
HCT: 46.3 % (ref 39.0–52.0)
Hemoglobin: 16.9 g/dL (ref 13.0–17.0)
Hemoglobin: 15.3 g/dL (ref 13.0–17.0)
MCH: 28.5 pg (ref 26.0–34.0)
MCH: 28.2 pg (ref 26.0–34.0)
MCHC: 32.3 g/dL (ref 30.0–36.0)
MCHC: 33 g/dL (ref 30.0–36.0)
MCV: 88.4 fL (ref 80.0–100.0)
MCV: 85.4 fL (ref 80.0–100.0)
Platelets: 229 10*3/uL (ref 150–400)
Platelets: 254 10*3/uL (ref 150–400)
RBC: 5.93 MIL/uL — ABNORMAL HIGH (ref 4.22–5.81)
RBC: 5.42 MIL/uL (ref 4.22–5.81)
RDW: 13.3 % (ref 11.5–15.5)
RDW: 13.5 % (ref 11.5–15.5)
WBC: 14.1 10*3/uL — ABNORMAL HIGH (ref 4.0–10.5)
WBC: 16.2 10*3/uL — ABNORMAL HIGH (ref 4.0–10.5)
nRBC: 0 % (ref 0.0–0.2)
nRBC: 0 % (ref 0.0–0.2)

## 2023-08-26 LAB — MRSA NEXT GEN BY PCR, NASAL: MRSA by PCR Next Gen: NOT DETECTED

## 2023-08-26 LAB — I-STAT CHEM 8, ED
BUN: 11 mg/dL (ref 6–20)
Calcium, Ion: 0.96 mmol/L — ABNORMAL LOW (ref 1.15–1.40)
Chloride: 104 mmol/L (ref 98–111)
Creatinine, Ser: 1.4 mg/dL — ABNORMAL HIGH (ref 0.61–1.24)
Glucose, Bld: 187 mg/dL — ABNORMAL HIGH (ref 70–99)
HCT: 52 % (ref 39.0–52.0)
Hemoglobin: 17.7 g/dL — ABNORMAL HIGH (ref 13.0–17.0)
Potassium: 3.1 mmol/L — ABNORMAL LOW (ref 3.5–5.1)
Sodium: 138 mmol/L (ref 135–145)
TCO2: 20 mmol/L — ABNORMAL LOW (ref 22–32)

## 2023-08-26 LAB — COOXEMETRY PANEL
Carboxyhemoglobin: 1 % (ref 0.5–1.5)
Carboxyhemoglobin: 1.2 % (ref 0.5–1.5)
Carboxyhemoglobin: 0.9 % (ref 0.5–1.5)
Methemoglobin: <0.7 % (ref 0.0–1.5)
Methemoglobin: 0.7 % (ref 0.0–1.5)
Methemoglobin: <0.7 % (ref 0.0–1.5)
O2 Saturation: 74.1 %
O2 Saturation: 61.6 %
O2 Saturation: 84.4 %
Total hemoglobin: 15.9 g/dL (ref 12.0–16.0)
Total hemoglobin: 15.9 g/dL (ref 12.0–16.0)
Total hemoglobin: 15.2 g/dL (ref 12.0–16.0)

## 2023-08-26 LAB — URINALYSIS, ROUTINE W REFLEX MICROSCOPIC
Bilirubin Urine: NEGATIVE
Glucose, UA: >=500 mg/dL — AB
Ketones, ur: NEGATIVE mg/dL
Leukocytes,Ua: NEGATIVE
Nitrite: NEGATIVE
Protein, ur: NEGATIVE mg/dL
Specific Gravity, Urine: 1.017 (ref 1.005–1.030)
pH: 5 (ref 5.0–8.0)

## 2023-08-26 LAB — BRAIN NATRIURETIC PEPTIDE: B Natriuretic Peptide: 102.9 pg/mL — ABNORMAL HIGH (ref 0.0–100.0)

## 2023-08-26 LAB — COMPREHENSIVE METABOLIC PANEL WITH GFR
ALT: 242 U/L — ABNORMAL HIGH (ref 0–44)
AST: 290 U/L — ABNORMAL HIGH (ref 15–41)
Albumin: 3.3 g/dL — ABNORMAL LOW (ref 3.5–5.0)
Alkaline Phosphatase: 105 U/L (ref 38–126)
Anion gap: 16 — ABNORMAL HIGH (ref 5–15)
BUN: 11 mg/dL (ref 6–20)
CO2: 18 mmol/L — ABNORMAL LOW (ref 22–32)
Calcium: 8.4 mg/dL — ABNORMAL LOW (ref 8.9–10.3)
Chloride: 104 mmol/L (ref 98–111)
Creatinine, Ser: 1.47 mg/dL — ABNORMAL HIGH (ref 0.61–1.24)
GFR, Estimated: 57 mL/min — ABNORMAL LOW (ref >60)
Glucose, Bld: 196 mg/dL — ABNORMAL HIGH (ref 70–99)
Potassium: 3.3 mmol/L — ABNORMAL LOW (ref 3.5–5.1)
Sodium: 138 mmol/L (ref 135–145)
Total Bilirubin: 0.6 mg/dL (ref 0.0–1.2)
Total Protein: 6.1 g/dL — ABNORMAL LOW (ref 6.5–8.1)

## 2023-08-26 LAB — CG4 I-STAT (LACTIC ACID): Lactic Acid, Venous: 3.6 mmol/L (ref 0.5–1.9)

## 2023-08-26 LAB — HEPATIC FUNCTION PANEL
ALT: 200 U/L — ABNORMAL HIGH (ref 0–44)
AST: 228 U/L — ABNORMAL HIGH (ref 15–41)
Albumin: 3.2 g/dL — ABNORMAL LOW (ref 3.5–5.0)
Alkaline Phosphatase: 85 U/L (ref 38–126)
Bilirubin, Direct: 0.3 mg/dL — ABNORMAL HIGH (ref 0.0–0.2)
Indirect Bilirubin: 0.9 mg/dL (ref 0.3–0.9)
Total Bilirubin: 1.2 mg/dL (ref 0.0–1.2)
Total Protein: 5.9 g/dL — ABNORMAL LOW (ref 6.5–8.1)

## 2023-08-26 LAB — I-STAT ARTERIAL BLOOD GAS, ED
Acid-base deficit: 5 mmol/L — ABNORMAL HIGH (ref 0.0–2.0)
Bicarbonate: 23.1 mmol/L (ref 20.0–28.0)
Calcium, Ion: 1.17 mmol/L (ref 1.15–1.40)
HCT: 43 % (ref 39.0–52.0)
Hemoglobin: 14.6 g/dL (ref 13.0–17.0)
O2 Saturation: 93 %
Patient temperature: 98.6
Potassium: 3 mmol/L — ABNORMAL LOW (ref 3.5–5.1)
Sodium: 137 mmol/L (ref 135–145)
TCO2: 25 mmol/L (ref 22–32)
pCO2 arterial: 55.5 mmHg — ABNORMAL HIGH (ref 32–48)
pH, Arterial: 7.227 — ABNORMAL LOW (ref 7.35–7.45)
pO2, Arterial: 82 mmHg — ABNORMAL LOW (ref 83–108)

## 2023-08-26 LAB — I-STAT VENOUS BLOOD GAS, ED
Acid-base deficit: 6 mmol/L — ABNORMAL HIGH (ref 0.0–2.0)
Bicarbonate: 20.1 mmol/L (ref 20.0–28.0)
Calcium, Ion: 0.96 mmol/L — ABNORMAL LOW (ref 1.15–1.40)
HCT: 52 % (ref 39.0–52.0)
Hemoglobin: 17.7 g/dL — ABNORMAL HIGH (ref 13.0–17.0)
O2 Saturation: 76 %
Potassium: 3.3 mmol/L — ABNORMAL LOW (ref 3.5–5.1)
Sodium: 137 mmol/L (ref 135–145)
TCO2: 21 mmol/L — ABNORMAL LOW (ref 22–32)
pCO2, Ven: 40.6 mmHg — ABNORMAL LOW (ref 44–60)
pH, Ven: 7.302 (ref 7.25–7.43)
pO2, Ven: 45 mmHg (ref 32–45)

## 2023-08-26 LAB — RAPID URINE DRUG SCREEN, HOSP PERFORMED
Amphetamines: NOT DETECTED
Barbiturates: NOT DETECTED
Benzodiazepines: POSITIVE — AB
Cocaine: NOT DETECTED
Opiates: NOT DETECTED
Tetrahydrocannabinol: NOT DETECTED

## 2023-08-26 LAB — ECHOCARDIOGRAM COMPLETE
Area-P 1/2: 5.16 cm2
Calc EF: 21.3 %
S' Lateral: 4.8 cm
Single Plane A2C EF: 25.9 %
Single Plane A4C EF: 25.7 %
Weight: 3463.87 [oz_av]

## 2023-08-26 LAB — TROPONIN I (HIGH SENSITIVITY)
Troponin I (High Sensitivity): 290 ng/L (ref <18)
Troponin I (High Sensitivity): 16 ng/L (ref <18)
Troponin I (High Sensitivity): 150 ng/L (ref <18)
Troponin I (High Sensitivity): 233 ng/L (ref <18)
Troponin I (High Sensitivity): 323 ng/L (ref <18)

## 2023-08-26 LAB — PHOSPHORUS: Phosphorus: 3.6 mg/dL (ref 2.5–4.6)

## 2023-08-26 LAB — LACTIC ACID, PLASMA
Lactic Acid, Venous: 2.2 mmol/L (ref 0.5–1.9)
Lactic Acid, Venous: 4.1 mmol/L (ref 0.5–1.9)

## 2023-08-26 LAB — ETHANOL: Alcohol, Ethyl (B): <10 mg/dL (ref <10)

## 2023-08-26 LAB — I-STAT CG4 LACTIC ACID, ED: Lactic Acid, Venous: 5.5 mmol/L (ref 0.5–1.9)

## 2023-08-26 LAB — CBG MONITORING, ED: Glucose-Capillary: 183 mg/dL — ABNORMAL HIGH (ref 70–99)

## 2023-08-26 LAB — PROTIME-INR
INR: 1.1 (ref 0.8–1.2)
Prothrombin Time: 14.1 s (ref 11.4–15.2)

## 2023-08-26 LAB — TSH: TSH: 2.213 u[IU]/mL (ref 0.350–4.500)

## 2023-08-26 LAB — CK: Total CK: 1253 U/L — ABNORMAL HIGH (ref 49–397)

## 2023-08-26 LAB — PROCALCITONIN: Procalcitonin: 21.78 ng/mL

## 2023-08-26 LAB — MAGNESIUM: Magnesium: 2 mg/dL (ref 1.7–2.4)

## 2023-08-26 SURGERY — RIGHT/LEFT HEART CATH AND CORONARY ANGIOGRAPHY
Anesthesia: LOCAL

## 2023-08-26 MED ORDER — ACETAMINOPHEN 160 MG/5ML PO SOLN: 650.0000 mg | ORAL | Status: DC | PRN

## 2023-08-26 MED ORDER — SODIUM CHLORIDE 0.9 % IV SOLN
INTRAVENOUS | Status: DC
Start: 2023-08-27 — End: 2023-08-26

## 2023-08-26 MED ORDER — AMIODARONE HCL IN DEXTROSE 360-4.14 MG/200ML-% IV SOLN
30.0000 mg/h | INTRAVENOUS | Status: DC
  Administered 2023-08-26 – 2023-08-31 (×9): 30 mg/h via INTRAVENOUS
  Filled 2023-08-26 (×10): qty 200

## 2023-08-26 MED ORDER — BUSPIRONE HCL 10 MG PO TABS: 30.0000 mg | ORAL_TABLET | Freq: Three times a day (TID) | ORAL | Status: DC | PRN

## 2023-08-26 MED ORDER — AMIODARONE HCL IN DEXTROSE 360-4.14 MG/200ML-% IV SOLN
60.0000 mg/h | INTRAVENOUS | Status: DC
  Administered 2023-08-26 (×2): 60 mg/h via INTRAVENOUS
  Filled 2023-08-26 (×2): qty 200

## 2023-08-26 MED ORDER — MIDAZOLAM HCL 2 MG/2ML IJ SOLN
2.0000 mg | Freq: Once | INTRAMUSCULAR | Status: AC
  Administered 2023-08-26: 2 mg via INTRAVENOUS

## 2023-08-26 MED ORDER — POLYETHYLENE GLYCOL 3350 17 G PO PACK
17.0000 g | PACK | Freq: Every day | ORAL | Status: DC
  Administered 2023-08-26 – 2023-08-27 (×2): 17 g
  Filled 2023-08-26 (×2): qty 1

## 2023-08-26 MED ORDER — DOCUSATE SODIUM 50 MG/5ML PO LIQD: 100.0000 mg | Freq: Two times a day (BID) | ORAL | Status: DC | PRN

## 2023-08-26 MED ORDER — FENTANYL CITRATE PF 50 MCG/ML IJ SOSY: 50.0000 ug | PREFILLED_SYRINGE | Freq: Once | INTRAMUSCULAR | Status: DC

## 2023-08-26 MED ORDER — ORAL CARE MOUTH RINSE: 15.0000 mL | OROMUCOSAL | Status: DC | PRN

## 2023-08-26 MED ORDER — SODIUM CHLORIDE 0.9 % IV BOLUS
500.0000 mL | Freq: Once | INTRAVENOUS | Status: AC
  Administered 2023-08-26: 500 mL via INTRAVENOUS

## 2023-08-26 MED ORDER — SODIUM CHLORIDE 0.9% FLUSH
3.0000 mL | Freq: Two times a day (BID) | INTRAVENOUS | Status: DC
Start: 2023-08-26 — End: 2023-09-03
  Administered 2023-08-26: 3 mL via INTRAVENOUS
  Administered 2023-08-26 – 2023-08-27 (×2): 10 mL via INTRAVENOUS
  Administered 2023-08-27: 3 mL via INTRAVENOUS
  Administered 2023-08-28: 10 mL via INTRAVENOUS
  Administered 2023-08-28 – 2023-08-29 (×3): 3 mL via INTRAVENOUS
  Administered 2023-08-30: 10 mL via INTRAVENOUS
  Administered 2023-08-31: 5 mL via INTRAVENOUS
  Administered 2023-08-31 – 2023-09-01 (×2): 3 mL via INTRAVENOUS
  Administered 2023-09-01 – 2023-09-03 (×4): 10 mL via INTRAVENOUS

## 2023-08-26 MED ORDER — MIDAZOLAM HCL 2 MG/2ML IJ SOLN
INTRAMUSCULAR | Status: AC
  Filled 2023-08-26: qty 2

## 2023-08-26 MED ORDER — DEXTROSE 50 % IV SOLN: 0.0000 mL | INTRAVENOUS | Status: DC | PRN

## 2023-08-26 MED ORDER — ASPIRIN 81 MG PO CHEW: 81.0000 mg | CHEWABLE_TABLET | Freq: Once | ORAL | Status: DC

## 2023-08-26 MED ORDER — DOCUSATE SODIUM 50 MG/5ML PO LIQD
100.0000 mg | Freq: Two times a day (BID) | ORAL | Status: DC
  Administered 2023-08-26 (×2): 100 mg
  Filled 2023-08-26 (×2): qty 10

## 2023-08-26 MED ORDER — PROSOURCE TF20 ENFIT COMPATIBL EN LIQD
60.0000 mL | Freq: Three times a day (TID) | ENTERAL | Status: DC
  Administered 2023-08-26 – 2023-08-27 (×2): 60 mL
  Filled 2023-08-26 (×2): qty 60

## 2023-08-26 MED ORDER — ACETAMINOPHEN 325 MG PO TABS: 650.0000 mg | ORAL_TABLET | ORAL | Status: DC

## 2023-08-26 MED ORDER — DIPHENHYDRAMINE HCL 50 MG/ML IJ SOLN
INTRAMUSCULAR | Status: AC
  Filled 2023-08-26: qty 1

## 2023-08-26 MED ORDER — POTASSIUM CHLORIDE 20 MEQ PO PACK
20.0000 meq | PACK | Freq: Once | ORAL | Status: AC
  Administered 2023-08-26: 20 meq
  Filled 2023-08-26: qty 1

## 2023-08-26 MED ORDER — SODIUM CHLORIDE 0.9 % IV SOLN
2.0000 g | INTRAVENOUS | Status: AC
Start: 2023-08-26 — End: 2023-08-31
  Administered 2023-08-26 – 2023-08-30 (×5): 2 g via INTRAVENOUS
  Filled 2023-08-26 (×5): qty 20

## 2023-08-26 MED ORDER — DIPHENHYDRAMINE HCL 50 MG/ML IJ SOLN
25.0000 mg | Freq: Once | INTRAMUSCULAR | Status: AC
  Administered 2023-08-26: 25 mg via INTRAVENOUS

## 2023-08-26 MED ORDER — LACTATED RINGERS IV BOLUS
500.0000 mL | Freq: Once | INTRAVENOUS | Status: AC
Start: 2023-08-26 — End: 2023-08-26
  Administered 2023-08-26: 500 mL via INTRAVENOUS

## 2023-08-26 MED ORDER — PIPERACILLIN-TAZOBACTAM 3.375 G IVPB: 3.3750 g | Freq: Three times a day (TID) | INTRAVENOUS | Status: DC

## 2023-08-26 MED ORDER — FAMOTIDINE 20 MG PO TABS
20.0000 mg | ORAL_TABLET | Freq: Two times a day (BID) | ORAL | Status: DC
  Administered 2023-08-26 – 2023-08-27 (×3): 20 mg
  Filled 2023-08-26 (×3): qty 1

## 2023-08-26 MED ORDER — FENTANYL BOLUS VIA INFUSION
50.0000 ug | INTRAVENOUS | Status: DC | PRN
  Administered 2023-08-27: 100 ug via INTRAVENOUS

## 2023-08-26 MED ORDER — INSULIN REGULAR(HUMAN) IN NACL 100-0.9 UT/100ML-% IV SOLN
INTRAVENOUS | Status: DC
  Administered 2023-08-26: 13 [IU]/h via INTRAVENOUS
  Administered 2023-08-26: 28 [IU]/h via INTRAVENOUS
  Administered 2023-08-26: 17 [IU]/h via INTRAVENOUS
  Administered 2023-08-27: 12 [IU]/h via INTRAVENOUS
  Administered 2023-08-27: 11 [IU]/h via INTRAVENOUS
  Filled 2023-08-26 (×5): qty 100

## 2023-08-26 MED ORDER — SODIUM CHLORIDE 0.9% FLUSH: 3.0000 mL | INTRAVENOUS | Status: DC | PRN

## 2023-08-26 MED ORDER — METHYLPREDNISOLONE SODIUM SUCC 40 MG IJ SOLR
40.0000 mg | Freq: Once | INTRAMUSCULAR | Status: AC
  Administered 2023-08-26: 40 mg via INTRAVENOUS
  Filled 2023-08-26: qty 1

## 2023-08-26 MED ORDER — CHLORHEXIDINE GLUCONATE CLOTH 2 % EX PADS
6.0000 | MEDICATED_PAD | Freq: Every day | CUTANEOUS | Status: DC
  Administered 2023-08-26 – 2023-09-03 (×9): 6 via TOPICAL

## 2023-08-26 MED ORDER — DOCUSATE SODIUM 100 MG PO CAPS: 100.0000 mg | ORAL_CAPSULE | Freq: Two times a day (BID) | ORAL | Status: DC | PRN

## 2023-08-26 MED ORDER — PIPERACILLIN-TAZOBACTAM 3.375 G IVPB 30 MIN
3.3750 g | Freq: Once | INTRAVENOUS | Status: AC
  Administered 2023-08-26: 3.375 g via INTRAVENOUS
  Filled 2023-08-26: qty 50

## 2023-08-26 MED ORDER — SODIUM CHLORIDE 0.9 % IV SOLN: INTRAVENOUS | Status: DC

## 2023-08-26 MED ORDER — LIDOCAINE HCL (PF) 1 % IJ SOLN
INTRAMUSCULAR | Status: DC | PRN
  Administered 2023-08-26 (×2): 2 mL

## 2023-08-26 MED ORDER — PROPOFOL 1000 MG/100ML IV EMUL
5.0000 ug/kg/min | INTRAVENOUS | Status: DC
  Administered 2023-08-26: 5 ug/kg/min via INTRAVENOUS

## 2023-08-26 MED ORDER — METHYLPREDNISOLONE SODIUM SUCC 125 MG IJ SOLR
INTRAMUSCULAR | Status: AC
  Filled 2023-08-26: qty 2

## 2023-08-26 MED ORDER — DIPHENHYDRAMINE HCL 50 MG/ML IJ SOLN
50.0000 mg | Freq: Once | INTRAMUSCULAR | Status: AC
  Administered 2023-08-26: 50 mg via INTRAVENOUS
  Filled 2023-08-26: qty 1

## 2023-08-26 MED ORDER — FENTANYL 2500MCG IN NS 250ML (10MCG/ML) PREMIX INFUSION
0.0000 ug/h | INTRAVENOUS | Status: DC
  Administered 2023-08-26: 25 ug/h via INTRAVENOUS
  Filled 2023-08-26: qty 250

## 2023-08-26 MED ORDER — PROPOFOL 1000 MG/100ML IV EMUL
INTRAVENOUS | Status: AC
  Filled 2023-08-26: qty 100

## 2023-08-26 MED ORDER — VERAPAMIL HCL 2.5 MG/ML IV SOLN
INTRAVENOUS | Status: DC | PRN
  Administered 2023-08-26: 10 mL via INTRA_ARTERIAL

## 2023-08-26 MED ORDER — VITAL 1.5 CAL PO LIQD
1000.0000 mL | ORAL | Status: DC
  Administered 2023-08-26 – 2023-08-27 (×3): 1000 mL

## 2023-08-26 MED ORDER — ACETAMINOPHEN 650 MG RE SUPP: 650.0000 mg | RECTAL | Status: DC

## 2023-08-26 MED ORDER — HEPARIN SODIUM (PORCINE) 1000 UNIT/ML IJ SOLN
INTRAMUSCULAR | Status: AC
  Filled 2023-08-26: qty 10

## 2023-08-26 MED ORDER — HEPARIN SODIUM (PORCINE) 5000 UNIT/ML IJ SOLN
5000.0000 [IU] | Freq: Three times a day (TID) | INTRAMUSCULAR | Status: DC
  Administered 2023-08-26 – 2023-09-03 (×26): 5000 [IU] via SUBCUTANEOUS
  Filled 2023-08-26 (×24): qty 1

## 2023-08-26 MED ORDER — HEPARIN (PORCINE) IN NACL 1000-0.9 UT/500ML-% IV SOLN
INTRAVENOUS | Status: DC | PRN
  Administered 2023-08-26 (×3): 500 mL

## 2023-08-26 MED ORDER — FENTANYL 2500MCG IN NS 250ML (10MCG/ML) PREMIX INFUSION: 50.0000 ug/h | INTRAVENOUS | Status: DC

## 2023-08-26 MED ORDER — IOHEXOL 350 MG/ML SOLN
INTRAVENOUS | Status: DC | PRN
  Administered 2023-08-26: 45 mg

## 2023-08-26 MED ORDER — AMIODARONE LOAD VIA INFUSION
150.0000 mg | Freq: Once | INTRAVENOUS | Status: AC
  Administered 2023-08-26: 150 mg via INTRAVENOUS
  Filled 2023-08-26: qty 83.34

## 2023-08-26 MED ORDER — PROPOFOL 1000 MG/100ML IV EMUL
0.0000 ug/kg/min | INTRAVENOUS | Status: DC
  Administered 2023-08-26: 25 ug/kg/min via INTRAVENOUS
  Administered 2023-08-26: 15 ug/kg/min via INTRAVENOUS
  Administered 2023-08-27 (×2): 25 ug/kg/min via INTRAVENOUS
  Filled 2023-08-26 (×3): qty 100

## 2023-08-26 MED ORDER — IOHEXOL 350 MG/ML SOLN
75.0000 mL | Freq: Once | INTRAVENOUS | Status: AC | PRN
  Administered 2023-08-26: 75 mL via INTRAVENOUS

## 2023-08-26 MED ORDER — NOREPINEPHRINE 4 MG/250ML-% IV SOLN
0.0000 ug/min | INTRAVENOUS | Status: DC
Start: 2023-08-26 — End: 2023-08-28
  Administered 2023-08-26: 9 ug/min via INTRAVENOUS
  Administered 2023-08-26: 13 ug/min via INTRAVENOUS
  Administered 2023-08-26: 10 ug/min via INTRAVENOUS
  Administered 2023-08-26: 4 ug/min via INTRAVENOUS
  Administered 2023-08-26: 16 ug/min via INTRAVENOUS
  Administered 2023-08-27: 9 ug/min via INTRAVENOUS
  Filled 2023-08-26 (×5): qty 250

## 2023-08-26 MED ORDER — VERAPAMIL HCL 2.5 MG/ML IV SOLN
INTRAVENOUS | Status: AC
  Filled 2023-08-26: qty 2

## 2023-08-26 MED ORDER — MAGNESIUM SULFATE 2 GM/50ML IV SOLN: 2.0000 g | Freq: Once | INTRAVENOUS | Status: AC | PRN

## 2023-08-26 MED ORDER — ORAL CARE MOUTH RINSE
15.0000 mL | OROMUCOSAL | Status: DC
  Administered 2023-08-26 – 2023-08-28 (×24): 15 mL via OROMUCOSAL

## 2023-08-26 MED ORDER — ACETAMINOPHEN 650 MG RE SUPP: 650.0000 mg | RECTAL | Status: DC | PRN

## 2023-08-26 MED ORDER — METHYLPREDNISOLONE SODIUM SUCC 125 MG IJ SOLR
125.0000 mg | Freq: Once | INTRAMUSCULAR | Status: AC
  Administered 2023-08-26: 125 mg via INTRAVENOUS

## 2023-08-26 MED ORDER — ACETAMINOPHEN 160 MG/5ML PO SOLN
650.0000 mg | ORAL | Status: DC
  Administered 2023-08-26 – 2023-08-27 (×6): 650 mg
  Filled 2023-08-26 (×5): qty 20.3

## 2023-08-26 MED ORDER — MILRINONE LACTATE IN DEXTROSE 20-5 MG/100ML-% IV SOLN
0.1250 ug/kg/min | INTRAVENOUS | Status: DC
  Administered 2023-08-26 – 2023-08-27 (×3): 0.25 ug/kg/min via INTRAVENOUS
  Administered 2023-08-28: 0.125 ug/kg/min via INTRAVENOUS
  Filled 2023-08-26 (×4): qty 100

## 2023-08-26 MED ORDER — ONDANSETRON HCL 4 MG/2ML IJ SOLN
4.0000 mg | Freq: Four times a day (QID) | INTRAMUSCULAR | Status: DC | PRN
Start: 2023-08-26 — End: 2023-09-03

## 2023-08-26 MED ORDER — POLYETHYLENE GLYCOL 3350 17 G PO PACK: 17.0000 g | PACK | Freq: Every day | ORAL | Status: DC | PRN

## 2023-08-26 MED ORDER — PERFLUTREN LIPID MICROSPHERE
1.0000 mL | INTRAVENOUS | Status: AC | PRN
  Administered 2023-08-26: 2 mL via INTRAVENOUS

## 2023-08-26 MED ORDER — POTASSIUM CHLORIDE 10 MEQ/100ML IV SOLN
10.0000 meq | INTRAVENOUS | Status: AC
  Administered 2023-08-26 (×3): 10 meq via INTRAVENOUS
  Filled 2023-08-26 (×4): qty 100

## 2023-08-26 MED ORDER — ACETAMINOPHEN 325 MG PO TABS: 650.0000 mg | ORAL_TABLET | ORAL | Status: DC | PRN

## 2023-08-26 MED ORDER — MAGNESIUM SULFATE 2 GM/50ML IV SOLN
2.0000 g | Freq: Once | INTRAVENOUS | Status: AC
  Administered 2023-08-26: 2 g via INTRAVENOUS
  Filled 2023-08-26: qty 50

## 2023-08-26 MED ORDER — HEPARIN SODIUM (PORCINE) 1000 UNIT/ML IJ SOLN
INTRAMUSCULAR | Status: DC | PRN
  Administered 2023-08-26: 5000 [IU] via INTRAVENOUS

## 2023-08-26 SURGICAL SUPPLY — 15 items
ADAPTER UNIV SWAN GANZ BIP (ADAPTER) IMPLANT
CATH 5FR JL3.5 JR4 ANG PIG MP (CATHETERS) IMPLANT
CATH SWAN GANZ 7F STRAIGHT (CATHETERS) IMPLANT
DEVICE RAD COMP TR BAND LRG (VASCULAR PRODUCTS) IMPLANT
ELECT DEFIB PAD ADLT CADENCE (PAD) IMPLANT
GLIDESHEATH SLEND SS 6F .021 (SHEATH) IMPLANT
GUIDEWIRE INQWIRE 1.5J.035X260 (WIRE) IMPLANT
INQWIRE 1.5J .035X260CM (WIRE) ×1 IMPLANT
KIT MICROPUNCTURE NIT STIFF (SHEATH) IMPLANT
KIT SINGLE USE MANIFOLD (KITS) IMPLANT
MAT PREVALON FULL STRYKER (MISCELLANEOUS) IMPLANT
PACK CARDIAC CATHETERIZATION (CUSTOM PROCEDURE TRAY) ×1 IMPLANT
SET ATX-X65L (MISCELLANEOUS) IMPLANT
SHEATH PINNACLE 7F 10CM (SHEATH) IMPLANT
SHEATH PROBE COVER 6X72 (BAG) IMPLANT

## 2023-08-26 NOTE — ED Notes (Signed)
 Pt transported to CT ?

## 2023-08-26 NOTE — ED Triage Notes (Signed)
 Pt BIB [facility] after sudden collapse and bystander CPR initiated. PT was found to be in V-Fib by EMS and shocked a total of 8 times. EMS started Amiodarone drip. Epi x 2 given. Narcan 2mg  also given. Pulses regained and estimate CPR lasted for 20 minutes.

## 2023-08-26 NOTE — Progress Notes (Signed)
 EEG complete - results pending

## 2023-08-26 NOTE — Inpatient Diabetes Management (Addendum)
 Inpatient Diabetes Program Recommendations  AACE/ADA: New Consensus Statement on Inpatient Glycemic Control (2015)  Target Ranges:  Prepandial:   less than 140 mg/dL      Peak postprandial:   less than 180 mg/dL (1-2 hours)      Critically ill patients:  140 - 180 mg/dL   Lab Results  Component Value Date   GLUCAP 316 (H) 08/26/2023   HGBA1C 7.3 (H) 07/08/2023    Review of Glycemic Control  Latest Reference Range & Units 08/26/23 01:15 08/26/23 03:04 08/26/23 08:15  Glucose-Capillary 70 - 99 mg/dL 993 (H) 716 (H) 967 (H)  (H): Data is abnormally high Diabetes history: Type 1 DM Outpatient Diabetes medications: Medtronic 780G- Novolog U-100 Current orders for Inpatient glycemic control: IV insulin Solumedrol 40 mg x 1  Inpatient Diabetes Program Recommendations:    Noted new orders for IV insulin. In agreement. Insulin needs continue to remain; continue.   Addendum: Met with family at bedside to discuss outpatient diabetes. Spoke with patient regarding diabetes and home regimen for diabetes management.  Patient is followed by Dr Erroll Luna, outpatient endocrinology with last visit 2/17. Patient uses a Medtronic insulin pump with Novolog insulin as an outpatient.  Patient has insulin pump at bedside, unapplied.  Insulin pump settings are as follows:  Basal insulin  12A 0.1 units/hour 4A 1.15 units/hour 6A 1.20 units/hour 10P 0.15 units/hour  Total daily basal insulin: 22.2 units/24 hours  Carb Coverage [place] 1:15 1 unit for every 15 grams of carbohydrates 0700 1:8 1 unit for every 8 grams of carbohydrates 1200 1:9.5 1 unit for every 9.5 grams of carbohydrates 1800 1:12 1 unit for every 12 grams of carbohydrates 2200 1:20 1 unit for every 20 grams of carbohydrates Insulin Sensitivity 1:35 1 unit drops blood glucose 35 mg/dl  Follow.  Thanks, Lujean Rave, MSN, RNC-OB Diabetes Coordinator [phone] (8a-5p)

## 2023-08-26 NOTE — Procedures (Signed)
 Arterial Catheter Insertion Procedure Note  Johnathan Ross  [id number]  04-09-1971  Date:08/26/23  Time:4:37 AM    Provider Performing: Darcella Gasman Tenley Winward    Procedure: Insertion of Arterial Line (62130) with US guidance (86578)   Indication(s) Blood pressure monitoring and/or need for frequent ABGs  Consent Risks of the procedure as well as the alternatives and risks of each were explained to the patient and/or caregiver.  Consent for the procedure was obtained and is signed in the bedside chart  Anesthesia None   Time Out Verified patient identification, verified procedure, site/side was marked, verified correct patient position, special equipment/implants available, medications/allergies/relevant history reviewed, required imaging and test results available.   Sterile Technique Maximal sterile technique including full sterile barrier drape, hand hygiene, sterile gown, sterile gloves, mask, hair covering, sterile ultrasound probe cover (if used).   Procedure Description Area of catheter insertion was cleaned with chlorhexidine and draped in sterile fashion. With real-time ultrasound guidance an arterial catheter was placed into the left radial artery.  Appropriate arterial tracings confirmed on monitor.     Complications/Tolerance None; patient tolerated the procedure well.   EBL Minimal   Specimen(s) None   Darcella Gasman Windell Musson, PA-C

## 2023-08-26 NOTE — ED Notes (Signed)
 Critical care at bedside

## 2023-08-26 NOTE — ED Notes (Signed)
 Family at bedside,as well as, Development worker, international aid and DR Pilar Plate

## 2023-08-26 NOTE — Progress Notes (Addendum)
 Advanced Heart Failure Rounding Note  Cardiologist: None  Chief Complaint: Out of hospital VF Arrest   Subjective:    Cardiology fellow saw overnight in consultation. AHF team picking up w/ ICU Rounds.   Admitted for VF arrest. Shocked 8 times, given epinephrine x2 and amiodarone. Intubated.   Initial K 3.3, Mg 2.0   Hs trop 290>>150 LA 5.5>>2.2 >>4.1  Post arrest EKG NSR w/ low voltage and short P-R interval. No STE. QTc ok 367 ms.  UDS + Benzos   Echo EF 30-35%, no LVH. RV mildly reduced   Co-ox 74%, CVP 12-13. No diuretics given today, minimal UOP this shift per RN report.  PCT 21. On Rocephin. BCx pending.   Remains intubated. Awaiting neuro prognostication. Sedated on propofol. No seizures on EEG.    Objective:   Weight Range: 98.2 kg Body mass index is 28.56 kg/m.   Vital Signs:   Temp:  [92.6 F (33.7 C)-98.8 F (37.1 C)] 98.8 F (37.1 C) (03/31 1145) Pulse Rate:  [70-140] 70 (03/31 1145) Resp:  [0-32] 25 (03/31 1145) BP: (85-165)/(40-108) 107/60 (03/31 1145) SpO2:  [88 %-100 %] 99 % (03/31 1145) Arterial Line BP: (77-150)/(51-74) 93/64 (03/31 1145) FiO2 (%):  [90 %-100 %] 90 % (03/31 1000) Weight:  [98.2 kg] 98.2 kg (03/31 0800) Last BM Date :  (PTA)  Weight change: Filed Weights   08/26/23 0500 08/26/23 0800  Weight: 98.2 kg 98.2 kg    Intake/Output:   Intake/Output Summary (Last 24 hours) at 08/26/2023 1201 Last data filed at 08/26/2023 0900 Gross per 24 hour  Intake 1995.49 ml  Output 1245 ml  Net 750.49 ml      Physical Exam    General:  intubated and sedated  HEENT: Normal + ETT  Neck: Supple. JVP 12 cm . Carotids 2+ bilat; no bruits. No lymphadenopathy or thyromegaly appreciated. Cor: PMI nondisplaced. Regular rate & rhythm. No rubs, gallops or murmurs. Lungs: intubated and clear  Abdomen: Soft, nontender, nondistended. Extremities: No cyanosis, clubbing, rash, edema Neuro: intubated and sedated GU: + foley     Telemetry    ?NSR 70s, no further VT   EKG    Post arrest EKG NSR w/ low voltage and short P-R interval. No STE. QTc ok 367 ms  Labs    CBC Recent Labs    08/26/23 0120 08/26/23 0121 08/26/23 0434 08/26/23 0439 08/26/23 0615 08/26/23 1014  WBC 14.1*  --  16.2*  --   --   --   HGB 16.9   < > 15.3   < > 16.7 16.3  HCT 52.4*   < > 46.3   < > 49.0 48.0  MCV 88.4  --  85.4  --   --   --   PLT 229  --  254  --   --   --    < > = values in this interval not displayed.   Basic Metabolic Panel Recent Labs    16/10/96 0120 08/26/23 0121 08/26/23 0122 08/26/23 0434 08/26/23 0439 08/26/23 0615 08/26/23 1014  NA 138 138   < > 134*   < > 133* 129*  K 3.3* 3.1*   < > 4.7   < > 5.3* 6.9*  CL 104 104  --  105  --   --   --   CO2 18*  --   --  17*  --   --   --   GLUCOSE 196* 187*  --  283*  --   --   --  BUN 11 11  --  11  --   --   --   CREATININE 1.47* 1.40*  --  1.16  --   --   --   CALCIUM 8.4*  --   --  7.6*  --   --   --   MG  --   --   --  2.0  --   --   --   PHOS  --   --   --  3.6  --   --   --    < > = values in this interval not displayed.   Liver Function Tests Recent Labs    08/26/23 0120 08/26/23 0719  AST 290* 228*  ALT 242* 200*  ALKPHOS 105 85  BILITOT 0.6 1.2  PROT 6.1* 5.9*  ALBUMIN 3.3* 3.2*   No results for input(s): "LIPASE", "AMYLASE" in the last 72 hours. Cardiac Enzymes Recent Labs    08/26/23 0719  CKTOTAL 1,253*    BNP: BNP (last 3 results) Recent Labs    08/26/23 0645  BNP 102.9*    ProBNP (last 3 results) No results for input(s): "PROBNP" in the last 8760 hours.   D-Dimer Recent Labs    08/25/23 1433  DDIMER 0.34   Hemoglobin A1C No results for input(s): "HGBA1C" in the last 72 hours. Fasting Lipid Panel No results for input(s): "CHOL", "HDL", "LDLCALC", "TRIG", "CHOLHDL", "LDLDIRECT" in the last 72 hours. Thyroid Function Tests Recent Labs    08/26/23 0434  TSH 2.213    Other results:   Imaging    US  RENAL Result Date: 08/26/2023 CLINICAL DATA:  Acute kidney injury. EXAM: RENAL / URINARY TRACT ULTRASOUND COMPLETE COMPARISON:  None. FINDINGS: Right Kidney: Renal measurements: 9.7 x 3.7 x 5.1 cm = volume: 96 mL. Increased echogenicity. No hydronephrosis. Left Kidney: Renal measurements: 9.8 x 4.5 x 4.7 cm = volume: 108 mL. Increased echogenicity. No hydronephrosis. Bladder: Decompressed by Foley catheter. Other: None. IMPRESSION: No hydronephrosis. Increased renal parenchymal echogenicity suggests medical renal disease. Electronically Signed   By: Kennith Center M.D.   On: 08/26/2023 11:54   EEG adult Result Date: 08/26/2023 Charlsie Quest, MD     08/26/2023 10:37 AM Patient Name: ONESIMO LINGARD MRN: [id number] Epilepsy Attending: Charlsie Quest Referring Physician/Provider: Gleason, Darcella Gasman, PA-C Date: 08/26/2023 Duration: 22.34 mins Patient history: 53 year old male status post cardiac arrest.  EEG evaluate for seizure. Level of alertness:  comatose AEDs during EEG study: Propofol Technical aspects: This EEG study was done with scalp electrodes positioned according to the 10-20 International system of electrode placement. Electrical activity was reviewed with band pass filter of 1-70Hz , sensitivity of 7 uV/mm, display speed of 1mm/sec with a 60Hz  notched filter applied as appropriate. EEG data were recorded continuously and digitally stored.  Video monitoring was available and reviewed as appropriate. Description: EEG showed continuous generalized 3 to 6 Hz theta-delta slowing. Hyperventilation and photic stimulation were not performed.   ABNORMALITY - Continuous slow, generalized IMPRESSION: This study is suggestive of moderate to severe diffuse encephalopathy. No seizures or epileptiform discharges were seen throughout the recording. Charlsie Quest   DG CHEST PORT 1 VIEW Result Date: 08/26/2023 CLINICAL DATA:  Central line placement. EXAM: PORTABLE CHEST 1 VIEW COMPARISON:  Earlier same day  FINDINGS: Endotracheal tube tip is 6.4 cm above the base of the carina. The NG tube passes into the stomach although the distal tip position is not included on the film. Right IJ central  line is new in the interval with tip overlying the low SVC level near the RA junction. The central right greater than left airspace disease is similar to prior. No substantial pleural effusion although neither costophrenic angle has been included on the film. Telemetry leads overlie the chest. IMPRESSION: 1. Right IJ central line tip overlies the low SVC level near the RA junction. No pneumothorax. 2. No other significant interval change. Electronically Signed   By: Kennith Center M.D.   On: 08/26/2023 05:02   CT HEAD WO CONTRAST ( ) Result Date: 08/26/2023 CLINICAL DATA:  Delirium EXAM: CT HEAD WITHOUT CONTRAST TECHNIQUE: Contiguous axial images were obtained from the base of the skull through the vertex without intravenous contrast. RADIATION DOSE REDUCTION: This exam was performed according to the departmental dose-optimization program which includes automated exposure control, adjustment of the mA and/or kV according to patient size and/or use of iterative reconstruction technique. COMPARISON:  MRI head August 20, 2017. FINDINGS: Brain: No evidence of acute infarction, hemorrhage, hydrocephalus, extra-axial collection or mass lesion/mass effect. White matter lesions better characterized on prior MRI. Vascular: No hyperdense vessel. Skull: No acute fracture. Sinuses/Orbits: Mild paranasal sinus mucosal thickening. No acute orbital findings. Other: No mastoid effusions. IMPRESSION: 1. No evidence of acute intracranial abnormality. 2. White matter lesions better characterized on prior MRI. Electronically Signed   By: Feliberto Harts M.D.   On: 08/26/2023 02:27   CT Angio Chest Pulmonary Embolism (PE) W or WO Contrast Result Date: 08/26/2023 CLINICAL DATA:  High probability pulmonary embolism, cardiopulmonary arrest EXAM: CT  ANGIOGRAPHY CHEST WITH CONTRAST TECHNIQUE: Multidetector CT imaging of the chest was performed using the standard protocol during bolus administration of intravenous contrast. Multiplanar CT image reconstructions and MIPs were obtained to evaluate the vascular anatomy. RADIATION DOSE REDUCTION: This exam was performed according to the departmental dose-optimization program which includes automated exposure control, adjustment of the mA and/or kV according to patient size and/or use of iterative reconstruction technique. CONTRAST:  75mL OMNIPAQUE IOHEXOL 350 MG/ML SOLN COMPARISON:  None Available. FINDINGS: Cardiovascular: Adequate opacification of the pulmonary arterial tree. No intraluminal filling defect identified to suggest acute pulmonary embolism. Central pulmonary arteries are of normal caliber. Moderate left anterior descending coronary artery calcification. Cardiac size within normal limits. No pericardial effusion. No significant atherosclerotic calcification within the thoracic aorta. No aortic aneurysm. Mediastinum/Nodes: Endotracheal tube and nasogastric tube are in appropriate position. The thyroid gland is atrophic or absent. No definite pathologic adenopathy though imaging is limited by phase of contrast enhancement. The esophagus is unremarkable. Lungs/Pleura: There is extensive dependent pulmonary consolidation throughout the lungs which may relate to aspiration or multifocal infection. No pneumothorax or pleural effusion. Central airways are widely patent. Upper Abdomen: No acute abnormality. Musculoskeletal: No chest wall abnormality. No acute or significant osseous findings. Review of the MIP images confirms the above findings. IMPRESSION: 1. No pulmonary embolism. 2. Moderate left anterior descending coronary artery calcification. 3. Extensive dependent pulmonary consolidation throughout the lungs which may relate to aspiration or multifocal infection. Electronically Signed   By: Helyn Numbers  M.D.   On: 08/26/2023 02:14   DG Chest Port 1 View Result Date: 08/26/2023 CLINICAL DATA:  Cardiac arrest, post CPR.  ET tube placement EXAM: PORTABLE CHEST 1 VIEW COMPARISON:  08/25/2023 FINDINGS: Endotracheal tube is 5 cm above the carina. OG tube enters the stomach. Heart and mediastinal contours are within normal limits. Perihilar airspace opacities may reflect edema. No effusions or pneumothorax. No acute bony abnormality. IMPRESSION: Support devices  in expected position as above. Bilateral perihilar opacities may reflect edema. Electronically Signed   By: Charlett Nose M.D.   On: 08/26/2023 01:47   DG Abd Portable 1V Result Date: 08/26/2023 CLINICAL DATA:  OG tube placement EXAM: PORTABLE ABDOMEN - 1 VIEW COMPARISON:  None Available. FINDINGS: OG tube tip in the stomach.  Nonobstructive bowel gas pattern. IMPRESSION: OG tube in the stomach. Electronically Signed   By: Charlett Nose M.D.   On: 08/26/2023 01:46   DG Chest Port 1 View Result Date: 08/25/2023 CLINICAL DATA:  Shortness of breath EXAM: PORTABLE CHEST 1 VIEW COMPARISON:  March 02, 2022 FINDINGS: Evaluation is limited by positioning. The cardiomediastinal silhouette is unchanged in contour. No pleural effusion. No pneumothorax. No acute pleuroparenchymal abnormality. IMPRESSION: No acute cardiopulmonary abnormality. Electronically Signed   By: Meda Klinefelter M.D.   On: 08/25/2023 14:10     Medications:     Scheduled Medications:  acetaminophen  650 mg Oral Q4H   Or   acetaminophen (TYLENOL) oral liquid 160 mg/5 mL  650 mg Per Tube Q4H   Or   acetaminophen  650 mg Rectal Q4H   Chlorhexidine Gluconate Cloth  6 each Topical Daily   docusate  100 mg Per Tube BID   famotidine  20 mg Per Tube BID   fentaNYL (SUBLIMAZE) injection  50 mcg Intravenous Once   heparin injection (subcutaneous)  5,000 Units Subcutaneous Q8H   mouth rinse  15 mL Mouth Rinse Q2H   polyethylene glycol  17 g Per Tube Daily   sodium chloride flush  3-10  mL Intravenous Q12H    Infusions:  amiodarone 30 mg/hr (08/26/23 0900)   cefTRIAXone (ROCEPHIN)  IV     fentaNYL infusion INTRAVENOUS 75 mcg/hr (08/26/23 0900)   insulin 22 Units/hr (08/26/23 1158)   magnesium sulfate     norepinephrine (LEVOPHED) Adult infusion 16 mcg/min (08/26/23 1108)   propofol (DIPRIVAN) infusion 15 mcg/kg/min (08/26/23 1019)    PRN Medications: [START ON 08/28/2023] acetaminophen **OR** [START ON 08/28/2023] acetaminophen (TYLENOL) oral liquid 160 mg/5 mL **OR** [START ON 08/28/2023] acetaminophen, busPIRone **OR** busPIRone, dextrose, docusate, fentaNYL, magnesium sulfate, ondansetron (ZOFRAN) IV, mouth rinse, polyethylene glycol, sodium chloride flush    Patient Profile   AHMAAD NEIDHARDT is a 53 y.o. male with a hx of DM1, hypothyroidism, HTN, multiple sclerosis, hyperlipidemia admitted for OOH VT/VF arrest.   Assessment/Plan   1. OOH VT/VF Arrest - ~ 20 min bystander CPR. ROSC after multiple defibs, Epi + amio  - no STE on post arrest EKG. Noted short PR interval, low voltage. QTc 367 ms - initial K 3.0, Mg 2.0  - Hs trop 290>>150 - Echo EF 30-35%, no LVH. RV mildly reduced  - received K supp, Keep K > 4.0, Mg > 2.0  - no further VT/VF. Continue amio gtt - Etiology uncertain. Plan R/LHC today to r/o ischemia. If cath unremarkable, will need cMRI and ICD prior to d/c pending meaning neurological recovery - Waiting neuro prognostication once off sedation. Neurology following   2. Systolic Heart Failure - Post Arrest Echo EF 30-35%, no LVH. RV mildly reduced. No prior study for comparison  - uncertain if baseline CM that triggered VT/VF vs cardiac stunning post arrest  - Co-ox ok 74% but also in the setting of current NE support, currently at 15 mcg/min for suspected septic shock - follow LA for clearance  - Plan [facility] cMRI today per above - low level Hs trop make acute myocarditis less  likely  - if no reversible causes identified, will need ICD for  secondary prevention, if neurologically recovers  - no oral GDMT currently given need for NE for BP support  - CVP 12-13. Hold diuretics for now given septic shock component. Will determine need for diuretics based on RHC - wean NE as tolerated   3. Vasoplegic/ Septic Shock  - PCT 21 - ? Aspiration. On rocephin per CCM  - on NE, currently 15 mcg. Wean as tolerated. Keep MAP > 65 - follow PCT level and LA for clearance  - follow culture data, Bcx NGTD   4. Acute Hypoxic Respiratory Failure - intubated post arrest - vent management per CCM  - abx for suspected aspiration PNA   5. Oliguria - suspect ATN post arrest  - admit SCr 1.40. Repeat BMP  - support MAP w/ NE  - plan inotropic support if RHC suggest cardiogenic shock  - follow UOP/BMP     Length of Stay: 0  Brittainy Simmons, PA-C  08/26/2023, 12:01 PM  Advanced Heart Failure Team Pager [phone] (M-F; 7a - 5p)  Please contact [facility] for night-coverage after hours (5p -7a ) and weekends on [email or web address]  Patient seen with APP, plan extensively discussed.   Subjective: - intubated/sedated   Exam: Blood pressure 125/65, pulse 65, temperature 98.1 F (36.7 C), temperature source Bladder, resp. rate (!) 28, weight 98.2 kg, SpO2 99%.  GENERAL: intubated/sedated Lungs- mechanical lung sounds CARDIAC:  RRR ABDOMEN: Soft, non-tender, non-distended.  EXTREMITIES: Warm and well perfused.  NEUROLOGIC: intubated/sedated  A/P Mr. Keetch is a 53 YO AAM w/ MS (severe LE weakness,b/l ankle fractures) currently admitted with out of hospital VF arrest requiring 8 shocks w/ immediate CPR by his wife.   - This AM, mixed venous of 74%, lactic acid 4.1 and procalcitonin of 21 concerning for mixed cardiogenic & septic shock.  - RHC today consistent with pre-dominant RV failure possibly secondary to myocardial stunning from recent arrest.  - Currently following TTM protocol. Plan on MRI brain tomorrow. On my exam patient has  intact pupillary/gag reflexes.  - Start milrinone 0.105mcg/kg/min for RV support; continue levophed.   Vaudine Dutan Advanced Heart Failure   CRITICAL CARE Performed by: Dorthula Nettles   Total critical care time: 40 minutes  Critical care time was exclusive of separately billable procedures and treating other patients.  Critical care was necessary to treat or prevent imminent or life-threatening deterioration.  Critical care was time spent personally by me on the following activities: development of treatment plan with patient and/or surrogate as well as nursing, discussions with consultants, evaluation of patient's response to treatment, examination of patient, obtaining history from patient or surrogate, ordering and performing treatments and interventions, ordering and review of laboratory studies, ordering and review of radiographic studies, pulse oximetry and re-evaluation of patient's condition.

## 2023-08-26 NOTE — TOC Initial Note (Signed)
 Transition of Care [facility]) - Initial/Assessment Note    Patient Details  Name: Johnathan Ross MRN: [id number] Date of Birth: 01/02/1971  Transition of Care [facility]) CM/SW Contact:    Elliot Cousin, RN Phone Number: [phone] 08/26/2023, 12:58 PM  Clinical Narrative:                  TOC CM spoke to SO, [place] and mother, Lynden Ang at bedside. Pt lives at home with SO. States he has RW and wheelchair at home. Mother drives pt to appts.   Will continue to follow for dc needs.  Expected Discharge Plan: IP Rehab Facility Barriers to Discharge: Continued Medical Work up   Patient Goals and CMS Choice            Expected Discharge Plan and Services   Discharge Planning Services: CM Consult   Living arrangements for the past 2 months: Single Family Home                                      Prior Living Arrangements/Services Living arrangements for the past 2 months: Single Family Home Lives with:: Significant Other                   Activities of Daily Living      Permission Sought/Granted                  Emotional Assessment   Attitude/Demeanor/Rapport: Intubated (Following Commands or Not Following Commands)          Admission diagnosis:  Cardiac arrest [facility]) [I46.9] Patient Active Problem List   Diagnosis Date Noted   Cardiac arrest (HCC) 08/26/2023   Encounter for screening for HIV 06/17/2023   Need for immunization against influenza 06/17/2023   Loud snoring 06/17/2023   Encounter for general adult medical examination with abnormal findings 08/02/2022   Screening for prostate cancer 08/01/2021   OAB (overactive bladder) 09/21/2020   Essential hypertension 09/21/2020   Thoracic outlet syndrome 05/03/2020   Controlled type 1 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 (HCC) 03/24/2020   Right epiretinal membrane 03/24/2020   Retinal exudates and deposits 03/24/2020   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   Nuclear sclerotic cataract of both eyes 03/24/2020   Gait disorder 03/23/2020   Benign prostatic hyperplasia without lower urinary tract symptoms 03/01/2020   Diabetic gastroparesis associated with type 1 diabetes mellitus (HCC) 03/01/2020   Gait disturbance 08/19/2019   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/19/2019   Vitamin D insufficiency 11/29/2016   Hypogonadism in male 11/01/2016   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HCC)    Hypothyroidism    Hypertension    Dyslipidemia, goal LDL below 130    MULTIPLE SCLEROSIS 08/29/2007   PROLIFERATIVE DIABETIC RETINOPATHY 08/29/2007   PCP:  Etta Grandchild, MD Pharmacy:   [id number] - [place], [place] - [address] AT [facility] [address] [place] [address] Phone: [phone] Fax: [phone]  [facility] - [place], [place] - [id number] [place] [address] Phone: [phone] Fax: [phone]  [id number] [place], [place] - [address] AT [facility] [address] [facility] [address] [place] [address] Phone: [phone] Fax: [phone]  [facility] -  [address] [place], [place] - [id number] [place] [address] Phone: [phone] Fax: [phone]  [facility] [place], [place] - [address] [place] [address] Phone: [phone] Fax: [phone]     Social Drivers of Health (SDOH) Social History: SDOH Screenings   Food Insecurity: No Food Insecurity (09/10/2022)  Housing: Low Risk  (09/10/2022)  Transportation Needs: No Transportation Needs (09/10/2022)  Utilities: Not At Risk (09/10/2022)  Alcohol Screen: Low Risk  (09/07/2021)  Depression (PHQ2-9): Low Risk  (09/10/2022)  Financial Resource Strain: Low Risk  (09/10/2022)  Physical  Activity: Sufficiently Active (09/10/2022)  Social Connections: Unknown (10/06/2021)   Received from [facility]  Recent Concern: Social Connections - Moderately Isolated (09/07/2021)  Stress: No Stress Concern Present (09/10/2022)  Tobacco Use: Low Risk  (08/25/2023)   SDOH Interventions:     Readmission Risk Interventions     No data to display

## 2023-08-26 NOTE — Progress Notes (Signed)
 Pt is in echo and unavailable at this time

## 2023-08-26 NOTE — Procedures (Signed)
 Central Venous Catheter Insertion Procedure Note  BARD HAUPERT  [id number]  [birth year]  Date:08/26/23  Time:4:37 AM   Provider Performing:Bryton Romagnoli R Yassine Brunsman   Procedure: Insertion of Non-tunneled Central Venous [phone]) with US guidance ([id number])   Indication(s) Medication administration  Consent Risks of the procedure as well as the alternatives and risks of each were explained to the patient and/or caregiver.  Consent for the procedure was obtained and is signed in the bedside chart  Anesthesia Topical only with 1% lidocaine   Timeout Verified patient identification, verified procedure, site/side was marked, verified correct patient position, special equipment/implants available, medications/allergies/relevant history reviewed, required imaging and test results available.  Sterile Technique Maximal sterile technique including full sterile barrier drape, hand hygiene, sterile gown, sterile gloves, mask, hair covering, sterile ultrasound probe cover (if used).  Procedure Description Area of catheter insertion was cleaned with chlorhexidine and draped in sterile fashion.  With real-time ultrasound guidance a central venous catheter was placed into the right internal jugular vein. Nonpulsatile blood flow and easy flushing noted in all ports.  The catheter was sutured in place and sterile dressing applied.  Complications/Tolerance None; patient tolerated the procedure well. Chest X-ray is ordered to verify placement for internal jugular or subclavian cannulation.   Chest x-ray is not ordered for femoral cannulation.  EBL Minimal  Specimen(s) None  Darcella Gasman Katalina Magri, PA-C

## 2023-08-26 NOTE — Progress Notes (Signed)
 eLink Physician-Brief Progress Note Patient Name: Johnathan Ross DOB: [birth year] MRN: [id number]   Date of Service  08/26/2023  HPI/Events of Note  53 year old male brought to the ED by EMS after he passed out.  EMS found him to be in ventricular fibrillation.  CPR was done and ROSC was achieved after about 20 minutes.  He is intubated and sedated in the ICU.  eICU Interventions  Patient's chart reviewed.  Pertinent labs and imaging studies reviewed.  Video assessment of patient done. Impression: VT arrest status post CPR with successful ROSC Prolonged downtime Anoxic encephalopathy Aspiration pneumonia AKI Elevated troponin       Intervention Category Evaluation Type: New Patient Evaluation  Carilyn Goodpasture 08/26/2023, 4:43 AM

## 2023-08-26 NOTE — Progress Notes (Signed)
 PT was transported to cath lab and back to 2H20 without complications.

## 2023-08-26 NOTE — H&P (View-Only) (Signed)
 Advanced Heart Failure Rounding Note  Cardiologist: None  Chief Complaint: Out of hospital VF Arrest   Subjective:    Cardiology fellow saw overnight in consultation. AHF team picking up w/ ICU Rounds.   Admitted for VF arrest. Shocked 8 times, given epinephrine x2 and amiodarone. Intubated.   Initial K 3.3, Mg 2.0   Hs trop 290>>150 LA 5.5>>2.2 >>4.1  Post arrest EKG NSR w/ low voltage and short P-R interval. No STE. QTc ok 367 ms.  UDS + Benzos   Echo EF 30-35%, no LVH. RV mildly reduced   Co-ox 74%, CVP 12-13. No diuretics given today, minimal UOP this shift per RN report.  PCT 21. On Rocephin. BCx pending.   Remains intubated. Awaiting neuro prognostication. Sedated on propofol. No seizures on EEG.    Objective:   Weight Range: 98.2 kg Body mass index is 28.56 kg/m.   Vital Signs:   Temp:  [92.6 F (33.7 C)-98.8 F (37.1 C)] 98.8 F (37.1 C) (03/31 1145) Pulse Rate:  [70-140] 70 (03/31 1145) Resp:  [0-32] 25 (03/31 1145) BP: (85-165)/(40-108) 107/60 (03/31 1145) SpO2:  [88 %-100 %] 99 % (03/31 1145) Arterial Line BP: (77-150)/(51-74) 93/64 (03/31 1145) FiO2 (%):  [90 %-100 %] 90 % (03/31 1000) Weight:  [98.2 kg] 98.2 kg (03/31 0800) Last BM Date :  (PTA)  Weight change: Filed Weights   08/26/23 0500 08/26/23 0800  Weight: 98.2 kg 98.2 kg    Intake/Output:   Intake/Output Summary (Last 24 hours) at 08/26/2023 1201 Last data filed at 08/26/2023 0900 Gross per 24 hour  Intake 1995.49 ml  Output 1245 ml  Net 750.49 ml      Physical Exam    General:  intubated and sedated  HEENT: Normal + ETT  Neck: Supple. JVP 12 cm . Carotids 2+ bilat; no bruits. No lymphadenopathy or thyromegaly appreciated. Cor: PMI nondisplaced. Regular rate & rhythm. No rubs, gallops or murmurs. Lungs: intubated and clear  Abdomen: Soft, nontender, nondistended. Extremities: No cyanosis, clubbing, rash, edema Neuro: intubated and sedated GU: + foley     Telemetry    ?NSR 70s, no further VT   EKG    Post arrest EKG NSR w/ low voltage and short P-R interval. No STE. QTc ok 367 ms  Labs    CBC Recent Labs    08/26/23 0120 08/26/23 0121 08/26/23 0434 08/26/23 0439 08/26/23 0615 08/26/23 1014  WBC 14.1*  --  16.2*  --   --   --   HGB 16.9   < > 15.3   < > 16.7 16.3  HCT 52.4*   < > 46.3   < > 49.0 48.0  MCV 88.4  --  85.4  --   --   --   PLT 229  --  254  --   --   --    < > = values in this interval not displayed.   Basic Metabolic Panel Recent Labs    45/40/98 0120 08/26/23 0121 08/26/23 0122 08/26/23 0434 08/26/23 0439 08/26/23 0615 08/26/23 1014  NA 138 138   < > 134*   < > 133* 129*  K 3.3* 3.1*   < > 4.7   < > 5.3* 6.9*  CL 104 104  --  105  --   --   --   CO2 18*  --   --  17*  --   --   --   GLUCOSE 196* 187*  --  283*  --   --   --  BUN 11 11  --  11  --   --   --   CREATININE 1.47* 1.40*  --  1.16  --   --   --   CALCIUM 8.4*  --   --  7.6*  --   --   --   MG  --   --   --  2.0  --   --   --   PHOS  --   --   --  3.6  --   --   --    < > = values in this interval not displayed.   Liver Function Tests Recent Labs    08/26/23 0120 08/26/23 0719  AST 290* 228*  ALT 242* 200*  ALKPHOS 105 85  BILITOT 0.6 1.2  PROT 6.1* 5.9*  ALBUMIN 3.3* 3.2*   No results for input(s): "LIPASE", "AMYLASE" in the last 72 hours. Cardiac Enzymes Recent Labs    08/26/23 0719  CKTOTAL 1,253*    BNP: BNP (last 3 results) Recent Labs    08/26/23 0645  BNP 102.9*    ProBNP (last 3 results) No results for input(s): "PROBNP" in the last 8760 hours.   D-Dimer Recent Labs    08/25/23 1433  DDIMER 0.34   Hemoglobin A1C No results for input(s): "HGBA1C" in the last 72 hours. Fasting Lipid Panel No results for input(s): "CHOL", "HDL", "LDLCALC", "TRIG", "CHOLHDL", "LDLDIRECT" in the last 72 hours. Thyroid Function Tests Recent Labs    08/26/23 0434  TSH 2.213    Other results:   Imaging    US  RENAL Result Date: 08/26/2023 CLINICAL DATA:  Acute kidney injury. EXAM: RENAL / URINARY TRACT ULTRASOUND COMPLETE COMPARISON:  None. FINDINGS: Right Kidney: Renal measurements: 9.7 x 3.7 x 5.1 cm = volume: 96 mL. Increased echogenicity. No hydronephrosis. Left Kidney: Renal measurements: 9.8 x 4.5 x 4.7 cm = volume: 108 mL. Increased echogenicity. No hydronephrosis. Bladder: Decompressed by Foley catheter. Other: None. IMPRESSION: No hydronephrosis. Increased renal parenchymal echogenicity suggests medical renal disease. Electronically Signed   By: Kennith Center M.D.   On: 08/26/2023 11:54   EEG adult Result Date: 08/26/2023 Charlsie Quest, MD     08/26/2023 10:37 AM Patient Name: DIANE MOCHIZUKI MRN: [id number] Epilepsy Attending: Charlsie Quest Referring Physician/Provider: Gleason, Darcella Gasman, PA-C Date: 08/26/2023 Duration: 22.34 mins Patient history: 53 year old male status post cardiac arrest.  EEG evaluate for seizure. Level of alertness:  comatose AEDs during EEG study: Propofol Technical aspects: This EEG study was done with scalp electrodes positioned according to the 10-20 International system of electrode placement. Electrical activity was reviewed with band pass filter of 1-70Hz , sensitivity of 7 uV/mm, display speed of 63mm/sec with a 60Hz  notched filter applied as appropriate. EEG data were recorded continuously and digitally stored.  Video monitoring was available and reviewed as appropriate. Description: EEG showed continuous generalized 3 to 6 Hz theta-delta slowing. Hyperventilation and photic stimulation were not performed.   ABNORMALITY - Continuous slow, generalized IMPRESSION: This study is suggestive of moderate to severe diffuse encephalopathy. No seizures or epileptiform discharges were seen throughout the recording. Charlsie Quest   DG CHEST PORT 1 VIEW Result Date: 08/26/2023 CLINICAL DATA:  Central line placement. EXAM: PORTABLE CHEST 1 VIEW COMPARISON:  Earlier same day  FINDINGS: Endotracheal tube tip is 6.4 cm above the base of the carina. The NG tube passes into the stomach although the distal tip position is not included on the film. Right IJ central  line is new in the interval with tip overlying the low SVC level near the RA junction. The central right greater than left airspace disease is similar to prior. No substantial pleural effusion although neither costophrenic angle has been included on the film. Telemetry leads overlie the chest. IMPRESSION: 1. Right IJ central line tip overlies the low SVC level near the RA junction. No pneumothorax. 2. No other significant interval change. Electronically Signed   By: Kennith Center M.D.   On: 08/26/2023 05:02   CT HEAD WO CONTRAST ( ) Result Date: 08/26/2023 CLINICAL DATA:  Delirium EXAM: CT HEAD WITHOUT CONTRAST TECHNIQUE: Contiguous axial images were obtained from the base of the skull through the vertex without intravenous contrast. RADIATION DOSE REDUCTION: This exam was performed according to the departmental dose-optimization program which includes automated exposure control, adjustment of the mA and/or kV according to patient size and/or use of iterative reconstruction technique. COMPARISON:  MRI head August 20, 2017. FINDINGS: Brain: No evidence of acute infarction, hemorrhage, hydrocephalus, extra-axial collection or mass lesion/mass effect. White matter lesions better characterized on prior MRI. Vascular: No hyperdense vessel. Skull: No acute fracture. Sinuses/Orbits: Mild paranasal sinus mucosal thickening. No acute orbital findings. Other: No mastoid effusions. IMPRESSION: 1. No evidence of acute intracranial abnormality. 2. White matter lesions better characterized on prior MRI. Electronically Signed   By: Feliberto Harts M.D.   On: 08/26/2023 02:27   CT Angio Chest Pulmonary Embolism (PE) W or WO Contrast Result Date: 08/26/2023 CLINICAL DATA:  High probability pulmonary embolism, cardiopulmonary arrest EXAM: CT  ANGIOGRAPHY CHEST WITH CONTRAST TECHNIQUE: Multidetector CT imaging of the chest was performed using the standard protocol during bolus administration of intravenous contrast. Multiplanar CT image reconstructions and MIPs were obtained to evaluate the vascular anatomy. RADIATION DOSE REDUCTION: This exam was performed according to the departmental dose-optimization program which includes automated exposure control, adjustment of the mA and/or kV according to patient size and/or use of iterative reconstruction technique. CONTRAST:  75mL OMNIPAQUE IOHEXOL 350 MG/ML SOLN COMPARISON:  None Available. FINDINGS: Cardiovascular: Adequate opacification of the pulmonary arterial tree. No intraluminal filling defect identified to suggest acute pulmonary embolism. Central pulmonary arteries are of normal caliber. Moderate left anterior descending coronary artery calcification. Cardiac size within normal limits. No pericardial effusion. No significant atherosclerotic calcification within the thoracic aorta. No aortic aneurysm. Mediastinum/Nodes: Endotracheal tube and nasogastric tube are in appropriate position. The thyroid gland is atrophic or absent. No definite pathologic adenopathy though imaging is limited by phase of contrast enhancement. The esophagus is unremarkable. Lungs/Pleura: There is extensive dependent pulmonary consolidation throughout the lungs which may relate to aspiration or multifocal infection. No pneumothorax or pleural effusion. Central airways are widely patent. Upper Abdomen: No acute abnormality. Musculoskeletal: No chest wall abnormality. No acute or significant osseous findings. Review of the MIP images confirms the above findings. IMPRESSION: 1. No pulmonary embolism. 2. Moderate left anterior descending coronary artery calcification. 3. Extensive dependent pulmonary consolidation throughout the lungs which may relate to aspiration or multifocal infection. Electronically Signed   By: Helyn Numbers  M.D.   On: 08/26/2023 02:14   DG Chest Port 1 View Result Date: 08/26/2023 CLINICAL DATA:  Cardiac arrest, post CPR.  ET tube placement EXAM: PORTABLE CHEST 1 VIEW COMPARISON:  08/25/2023 FINDINGS: Endotracheal tube is 5 cm above the carina. OG tube enters the stomach. Heart and mediastinal contours are within normal limits. Perihilar airspace opacities may reflect edema. No effusions or pneumothorax. No acute bony abnormality. IMPRESSION: Support devices  in expected position as above. Bilateral perihilar opacities may reflect edema. Electronically Signed   By: Charlett Nose M.D.   On: 08/26/2023 01:47   DG Abd Portable 1V Result Date: 08/26/2023 CLINICAL DATA:  OG tube placement EXAM: PORTABLE ABDOMEN - 1 VIEW COMPARISON:  None Available. FINDINGS: OG tube tip in the stomach.  Nonobstructive bowel gas pattern. IMPRESSION: OG tube in the stomach. Electronically Signed   By: Charlett Nose M.D.   On: 08/26/2023 01:46   DG Chest Port 1 View Result Date: 08/25/2023 CLINICAL DATA:  Shortness of breath EXAM: PORTABLE CHEST 1 VIEW COMPARISON:  March 02, 2022 FINDINGS: Evaluation is limited by positioning. The cardiomediastinal silhouette is unchanged in contour. No pleural effusion. No pneumothorax. No acute pleuroparenchymal abnormality. IMPRESSION: No acute cardiopulmonary abnormality. Electronically Signed   By: Meda Klinefelter M.D.   On: 08/25/2023 14:10     Medications:     Scheduled Medications:  acetaminophen  650 mg Oral Q4H   Or   acetaminophen (TYLENOL) oral liquid 160 mg/5 mL  650 mg Per Tube Q4H   Or   acetaminophen  650 mg Rectal Q4H   Chlorhexidine Gluconate Cloth  6 each Topical Daily   docusate  100 mg Per Tube BID   famotidine  20 mg Per Tube BID   fentaNYL (SUBLIMAZE) injection  50 mcg Intravenous Once   heparin injection (subcutaneous)  5,000 Units Subcutaneous Q8H   mouth rinse  15 mL Mouth Rinse Q2H   polyethylene glycol  17 g Per Tube Daily   sodium chloride flush  3-10  mL Intravenous Q12H    Infusions:  amiodarone 30 mg/hr (08/26/23 0900)   cefTRIAXone (ROCEPHIN)  IV     fentaNYL infusion INTRAVENOUS 75 mcg/hr (08/26/23 0900)   insulin 22 Units/hr (08/26/23 1158)   magnesium sulfate     norepinephrine (LEVOPHED) Adult infusion 16 mcg/min (08/26/23 1108)   propofol (DIPRIVAN) infusion 15 mcg/kg/min (08/26/23 1019)    PRN Medications: [START ON 08/28/2023] acetaminophen **OR** [START ON 08/28/2023] acetaminophen (TYLENOL) oral liquid 160 mg/5 mL **OR** [START ON 08/28/2023] acetaminophen, busPIRone **OR** busPIRone, dextrose, docusate, fentaNYL, magnesium sulfate, ondansetron (ZOFRAN) IV, mouth rinse, polyethylene glycol, sodium chloride flush    Patient Profile   Johnathan Ross is a 53 y.o. male with a hx of DM1, hypothyroidism, HTN, multiple sclerosis, hyperlipidemia admitted for OOH VT/VF arrest.   Assessment/Plan   1. OOH VT/VF Arrest - ~ 20 min bystander CPR. ROSC after multiple defibs, Epi + amio  - no STE on post arrest EKG. Noted short PR interval, low voltage. QTc 367 ms - initial K 3.0, Mg 2.0  - Hs trop 290>>150 - Echo EF 30-35%, no LVH. RV mildly reduced  - received K supp, Keep K > 4.0, Mg > 2.0  - no further VT/VF. Continue amio gtt - Etiology uncertain. Plan R/LHC today to r/o ischemia. If cath unremarkable, will need cMRI and ICD prior to d/c pending meaning neurological recovery - Waiting neuro prognostication once off sedation. Neurology following   2. Systolic Heart Failure - Post Arrest Echo EF 30-35%, no LVH. RV mildly reduced. No prior study for comparison  - uncertain if baseline CM that triggered VT/VF vs cardiac stunning post arrest  - Co-ox ok 74% but also in the setting of current NE support, currently at 15 mcg/min for suspected septic shock - follow LA for clearance  - Plan [facility] +/- cMRI today per above - low level Hs trop make acute myocarditis less  likely  - if no reversible causes identified, will need ICD for  secondary prevention, if neurologically recovers  - no oral GDMT currently given need for NE for BP support  - CVP 12-13. Hold diuretics for now given septic shock component. Will determine need for diuretics based on RHC - wean NE as tolerated   3. Vasoplegic/ Septic Shock  - PCT 21 - ? Aspiration. On rocephin per CCM  - on NE, currently 15 mcg. Wean as tolerated. Keep MAP > 65 - follow PCT level and LA for clearance  - follow culture data, Bcx NGTD   4. Acute Hypoxic Respiratory Failure - intubated post arrest - vent management per CCM  - abx for suspected aspiration PNA   5. Oliguria - suspect ATN post arrest  - admit SCr 1.40. Repeat BMP  - support MAP w/ NE  - plan inotropic support if RHC suggest cardiogenic shock  - follow UOP/BMP     Length of Stay: 0  Michaiah Holsopple, PA-C  08/26/2023, 12:01 PM  Advanced Heart Failure Team Pager [phone] (M-F; 7a - 5p)  Please contact [facility] for night-coverage after hours (5p -7a ) and weekends on [email or web address]

## 2023-08-26 NOTE — Progress Notes (Signed)
 RT attempted to obtain sputum sample, not enough obtained to send at this time. Will attempt at next round.

## 2023-08-26 NOTE — Progress Notes (Signed)
 Pharmacy Antibiotic Note  Johnathan Ross is a 53 y.o. male admitted on 08/26/2023 s/p cardiac arrest >> concern for aspiration pneumonia.  Pharmacy has been consulted for Zosyn dosing.  Plan: Zosyn 3.375g IV q8h (4 hour infusion).   Temp (24hrs), Avg:95.8 F (35.4 C), Min:92.6 F (33.7 C), Max:99 F (37.2 C)  Recent Labs  Lab 08/25/23 1312 08/26/23 0120 08/26/23 0121 08/26/23 0122  WBC 8.5 14.1*  --   --   CREATININE 1.19 1.47* 1.40*  --   LATICACIDVEN  --   --   --  5.5*    Estimated Creatinine Clearance: 70.5 mL/min (A) (by C-G formula based on SCr of 1.4 mg/dL (H)).    Allergies  Allergen Reactions   Lisinopril Cough   Ivp Dye [Iodinated Contrast Media] Hives and Itching    Thank you for allowing pharmacy to be a part of this patient's care.  Vernard Gambles, PharmD, BCPS  08/26/2023 2:52 AM

## 2023-08-26 NOTE — Progress Notes (Signed)
 Initial Nutrition Assessment  DOCUMENTATION CODES:   Not applicable  INTERVENTION:   Initiate tube feeding via OG tube: Vital 1.5 at 20 ml/h, increase by 10 ml every 4 hours to goal rate of 60 ml/h (1440 ml per day). Prosource TF20 60 ml TID. Provides 2400 kcal, 157 gm protein, 1100 ml free water, daily. At goal rate, TF will provide 269 gm carbohydrates per 24 hours.  NUTRITION DIAGNOSIS:   Inadequate oral intake related to inability to eat as evidenced by NPO status.  GOAL:   Patient will meet greater than or equal to 90% of their needs  MONITOR:   TF tolerance  REASON FOR ASSESSMENT:   Ventilator, Consult Enteral/tube feeding initiation and management  ASSESSMENT:   53 yo male admitted S/P out of hospital V fib cardiac arrest. PMH includes HLD, HTN, hypothyroidism, proliferative diabetic retinopathy, multiple sclerosis, DM-1, vitamin D insufficiency.  Per discussion with patient's girlfriend and mother, he typically has a good appetite and eats well. He had been eating a little less for the past 3 weeks because he had gained some weight, so he was trying to lose weight. His usual weight is ~200-205 lbs.   Weight history reviewed. No significant weight changes noted over the past year. Current weight is above usual weight a little, likely d/t edema.  Discussed with MD, okay to begin TF. Currently on insulin drip.   Patient is currently intubated on ventilator support MV: 15.2 L/min Temp (24hrs), Avg:97.5 F (36.4 C), Min:92.6 F (33.7 C), Max:99.1 F (37.3 C)  Propofol: 8.5 ml/hr providing 224 kcal from lipid.  Labs reviewed. K 6.9, Na 129, A1C 7.3 (07/08/23) CBG: 016-010  Medications reviewed and include colace, pepcid, fentanyl, heparin, miralax, potassium chloride, insulin drip, mag sulfate, levophed, propofol.  NUTRITION - FOCUSED PHYSICAL EXAM:  Flowsheet Row Most Recent Value  Orbital Region No depletion  Upper Arm Region No depletion  Thoracic and  Lumbar Region No depletion  Buccal Region Unable to assess  [mild pitting edema per RN assessment]  Temple Region No depletion  Clavicle Bone Region No depletion  Clavicle and Acromion Bone Region No depletion  Scapular Bone Region No depletion  Dorsal Hand No depletion  Patellar Region No depletion  Anterior Thigh Region No depletion  Posterior Calf Region No depletion  Edema (RD Assessment) Unable to assess  Hair Reviewed  Eyes Unable to assess  Mouth Unable to assess  Skin Reviewed  Nails Reviewed       Diet Order:   Diet Order             Diet NPO time specified  Diet effective now                   EDUCATION NEEDS:   No education needs have been identified at this time  Skin:  Skin Assessment: Reviewed RN Assessment  Last BM:  no BM documented  Height:   Ht Readings from Last 1 Encounters:  08/25/23 6\' 1"  (1.854 m)    Weight:   Wt Readings from Last 1 Encounters:  08/26/23 98.2 kg    Ideal Body Weight:  83.6 kg  BMI:  Body mass index is 28.56 kg/m.  Estimated Nutritional Needs:   Kcal:  2200-2400  Protein:  140-160 gm  Fluid:  2.2-2.4 L   Gabriel Rainwater RD, LDN, CNSC Contact via secure chat. If unavailable, use group chat "RD Inpatient."

## 2023-08-26 NOTE — H&P (Signed)
 NAME:  Johnathan Ross, MRN:  [id number], DOB:  1970/07/02, LOS: 0 ADMISSION DATE:  08/26/2023, CONSULTATION DATE: 08/26/23 REFERRING MD:  Pilar Plate, CHIEF COMPLAINT:  cardiac arrest   History of Present Illness:  Johnathan Ross is a 53 y.o. M with PMH of relapsing remitting MS on Lemtrada, HL, HTN, hypothyroidism, type 1 DM who presented to the ED after apparently collapsing and requiring bystander CPR before EMS arrived and he was found to be in Vfib.  Arrest witnessed by his girlfriend. Pt was shocked eight times, given Epi x2 and Amiodarone. CPR lasted approximately 20 minutes.  He was intubated and brought to the ED where he was in sinus rhythm with prolonged Qtc.  He required peripheral norepi.  A CT head and CTA chest did not reveal any intracranial abnormalities and no PE though had extensive consolidation throughout the lungs, likely aspiration.  PCCM consulted for admission. 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   Significant Hospital Events: Including procedures, antibiotic start and stop dates in addition to other pertinent events   3/31 Vfib arrest, approx 20 mins ACLS, ETT and ICU admit  Interim History / Subjective:  Requiring norepi Currently unresponsive   Objective   Blood pressure (!) 124/92, pulse (!) 134, temperature (!) 92.6 F (33.7 C), resp. rate 20, SpO2 97%.    Vent Mode: PRVC FiO2 (%):  [100 %] 100 % Set Rate:  [20 bmp] 20 bmp Vt Set:  [630 mL] 630 mL PEEP:  [10 cmH20] 10 cmH20 Plateau Pressure:  [22 cmH20] 22 cmH20  No intake or output data in the 24  hours ending 08/26/23 0207 There were no vitals filed for this visit.  General:  well nourished M, critically ill intubated and sedated  HEENT: MM pink/moist Neuro: unresponsive on fentanyl , pupils equal 2-43mm and sluggishly responsive to light, does not withdraw to pain CV: s1s2 rrr, no m/r/g PULM:  clear bilaterally on PRVC GI: soft, bsx4 active  Extremities: warm/dry, no edema  Skin: no rashes or lesions   Resolved Hospital Problem list     Assessment & Plan:    Witnessed out of hospital Vfib arrest  Cardioverted 8x, approximately 20 mins down time  Shock, concern for cardiogenic  Acute Hypoxic Respiratory Failure -cardiology consulted, troponin 16, unable to get good cardiac windows for POCUS -CTA chest shows moderate left anterior descending coronary artery calcification, trop only 16, trend and check BNP -echo, EEG -continue amiodarone gtt -target normothermia -appreciate cardiology consult, trend troponin  -CTA chest without PE, though consistent with aspiration, start Zosyn and Vanc -blood and respiratory cultures -check UDS -trend lactic acid, continue norepi to maintain MAP >65, may need CVC -fentanyl for PAD protocol, monitor for signs of neurologic recovery --Maintain full vent support with SAT/SBT as tolerated -titrate Vent setting to maintain SpO2 greater than or equal to 90%. -HOB elevated 30 degrees. -Plateau pressures less than 30 cm H20.  -Follow chest x-ray, ABG prn.   -Bronchial hygiene and RT/bronchodilator protocol.   AKI Elevated transaminases  AGMA Hypokalemia Creatinine 1.4, AST 290, ALT 242, likely ATN and shock liver in the setting of cardiac arrest -trend creatinine, electrolytes and BMP -  received 500cc bolus, give additional 500cc -trend and repeat electrolytes prn   History of HTN, HL -hold home medications  Type 1 DM -appears he has an insulin pump with novolog -start SSI first and monitor glucose trends in the setting of  critical illness   Multiple Sclerosis  -on Lemtrada   Hypothyroidism -check TSH, continue Synthroid   Best Practice (right click and "Reselect all SmartList Selections" daily)   Diet/type: NPO DVT prophylaxis prophylactic heparin  Pressure ulcer(s): N/A GI prophylaxis: H2B Lines: N/A Foley:  Yes, and it is still needed Code Status:  full code Last date of multidisciplinary goals of care discussion [pending, unable to reach sister or mother listed in chart]  Labs   CBC: Recent Labs  Lab 08/25/23 1312 08/26/23 0120 08/26/23 0121 08/26/23 0122  WBC 8.5 14.1*  --   --   HGB 15.1 16.9 17.7* 17.7*  HCT 45.7 52.4* 52.0 52.0  MCV 86.6 88.4  --   --   PLT 236 229  --   --     Basic Metabolic Panel: Recent Labs  Lab 08/25/23 1312 08/26/23 0121 08/26/23 0122  NA 136 138 137  K 4.6 3.1* 3.3*  CL 102 104  --   CO2 27  --   --   GLUCOSE 150* 187*  --   BUN 10 11  --   CREATININE 1.19 1.40*  --   CALCIUM 9.1  --   --    GFR: Estimated Creatinine Clearance: 70.5 mL/min (A) (by C-G formula based on SCr of 1.4 mg/dL (H)). Recent Labs  Lab 08/25/23 1312 08/26/23 0120 08/26/23 0122  WBC 8.5 14.1*  --   LATICACIDVEN  --   --  5.5*    Liver Function Tests: Recent Labs  Lab 08/25/23 1312  AST 27  ALT 22  ALKPHOS 73  BILITOT 0.6  PROT 6.6  ALBUMIN 3.8   No results for input(s): "LIPASE", "AMYLASE" in the last 168 hours. No results for input(s): "AMMONIA" in the last 168 hours.  ABG    Component Value Date/Time   HCO3 20.1 08/26/2023 0122   TCO2 21 (L) 08/26/2023 0122   ACIDBASEDEF 6.0 (H) 08/26/2023 0122   O2SAT 76 08/26/2023 0122     Coagulation Profile: Recent Labs  Lab 08/26/23 0120  INR 1.1    Cardiac Enzymes: No results for input(s): "CKTOTAL", "CKMB", "CKMBINDEX", "TROPONINI" in the last 168 hours.  HbA1C: Hgb A1c MFr Bld  Date/Time Value Ref Range Status  07/08/2023 11:06 AM 7.3 (H) <5.7 % of total Hgb Final    Comment:    For someone  without known diabetes, a hemoglobin A1c value of 6.5% or greater indicates that they may have  diabetes and this should be confirmed with a follow-up  test. . For someone with known diabetes, a value <7% indicates  that their diabetes is well controlled and a value  greater than or equal to 7% indicates suboptimal  control. A1c targets should be individualized based on  duration of diabetes, age, comorbid conditions, and  other considerations. . Currently, no consensus exists regarding use of hemoglobin A1c for diagnosis of diabetes for children. .   03/15/2023 09:00 AM 7.5 (H) 4.6 - 6.5 % Final    Comment:    Glycemic Control Guidelines for People with Diabetes:Non Diabetic:  <6%Goal of Therapy: <7%Additional Action Suggested:  >8%     CBG: Recent Labs  Lab 08/26/23 0115  GLUCAP 183*    Review of Systems:  Unable to obtain   Past Medical History:  He,  has a past medical history of Hyperlipidemia, Hypertension, Hypogonadism in male (11/01/2016), Hypothyroidism, Multiple sclerosis (HCC), Neuromuscular disorder (HCC), Proliferative diabetic retinopathy(362.02), Type 1 diabetes mellitus (HCC) (dx'd 1994), and Vitamin D insufficiency (11/29/2016).   Surgical History:   Past Surgical History:  Procedure Laterality Date   EYE SURGERY Bilateral    "laser for diabetic retinopathy"   FRACTURE SURGERY     IR VENO/EXT/UNI LEFT  03/24/2020   OPEN REDUCTION INTERNAL FIXATION (ORIF) TIBIA/FIBULA FRACTURE Right 10/30/2016   ORIF ANKLE FRACTURE Left 2015   ORIF TIBIA FRACTURE Right 10/30/2016   Procedure: OPEN REDUCTION INTERNAL FIXATION (ORIF) TIBIA FIBULA FRACTURE;  Surgeon: Myrene Galas, MD;  Location: [facility] OR;  Service: Orthopedics;  Laterality: Right;   RETINAL LASER PROCEDURE Bilateral    "for diabetic retinopathy"     Social History:   reports that he has never smoked. He has never used smokeless tobacco. He reports that he does not drink alcohol and does not use drugs.    Family History:  His family history includes Colon cancer in his maternal grandmother; Colon polyps in his maternal uncle; Diabetes in his sister; Stomach cancer in his cousin. There is no history of Esophageal cancer.   Allergies Allergies  Allergen Reactions   Lisinopril Cough   Ivp Dye [Iodinated Contrast Media] Hives and Itching     Home Medications  Prior to Admission medications   Medication Sig Start Date End Date Taking? Authorizing Provider  baclofen (LIORESAL) 10 MG tablet Take 1 tablet (10 mg total) by mouth 3 (three) times daily. 06/20/23   Sater, Pearletha Furl, MD  dalfampridine 10 MG TB12 Take 1 tablet (10 mg total) by mouth 2 (two) times daily. 06/20/23   Sater, Pearletha Furl, MD  Dasiglucagon HCl (ZEGALOGUE) 0.6 MG/0.6ML SOAJ Inject 1 Act into the skin daily as needed. Patient not taking: Reported on 06/20/2023 06/18/23   Etta Grandchild, MD  gabapentin (NEURONTIN) 100 MG capsule Take 1 capsule (100 mg total) by mouth 3 (three) times daily. Patient not taking: Reported on 06/20/2023 03/12/22   Corwin Levins, MD  glucose blood (CONTOUR NEXT TEST) test strip USE 2 TIMES PER DAY. E10.65 Patient not taking: Reported on 06/20/2023 08/22/21   Reather Littler, MD  levocetirizine (XYZAL) 5 MG tablet TAKE 1 TABLET BY MOUTH EVERY DAY IN THE EVENING 07/18/23   Etta Grandchild, MD  losartan (COZAAR) 50 MG tablet Take 1 tablet (50 mg total) by mouth daily. 07/23/23   Thapa, [place], MD  NOVOLOG 100 UNIT/ML injection USE UP TO 80 UNITS IN INSULIN PUMP DAILY-DX CODE E10.8 01/09/23   Reather Littler, MD  OVER THE COUNTER MEDICATION Sea moss supplement-vitamin and minerals daily Patient not taking: Reported on 06/20/2023    [provider]  silodosin (RAPAFLO) 8 MG CAPS capsule Take 1 capsule (8 mg total) by mouth daily with breakfast. Patient not taking: Reported on 06/20/2023 02/07/22   Sater, Pearletha Furl, MD  simvastatin (ZOCOR) 20 MG tablet Take 1 tablet (20 mg total) by mouth daily at 6 PM. 06/11/23    Etta Grandchild, MD  solifenacin (VESICARE) 5 MG tablet TAKE 1 TABLET (5 MG TOTAL) BY MOUTH DAILY. 11/25/22   Etta Grandchild, MD  SYNTHROID 125 MCG tablet Take 1 tablet by mouth 6 days per week. 06/21/23   Thapa, [place], MD     Critical care time:  50 minutes       CRITICAL  CARE Performed by: Darcella Gasman Nickolis Diel   Total critical care time: 50 minutes  Critical care time was exclusive of separately billable procedures and treating other patients.  Critical care was necessary to treat or prevent imminent or life-threatening deterioration.  Critical care was time spent personally by me on the following activities: development of treatment plan with patient and/or surrogate as well as nursing, discussions with consultants, evaluation of patient's response to treatment, examination of patient, obtaining history from patient or surrogate, ordering and performing treatments and interventions, ordering and review of laboratory studies, ordering and review of radiographic studies, pulse oximetry and re-evaluation of patient's condition.  Darcella Gasman Levie Owensby, PA-C  Pulmonary & Critical care See Amion for pager If no response to pager , please call [phone] until 7pm After 7:00 pm call Elink  [phone]

## 2023-08-26 NOTE — Progress Notes (Signed)
 08/26/2023  Seen and examined Ext lukewarm EF definitely down globally, RV looks okay CVP up Coox reasonable, rechecking  DC all sedation, on PS to see if he can do better than vent in getting pH near normal  Recheck coox, lactate, trop, EKG  If does not wake up, throw on arctic sun and order TTM  Abx to ceftriaxone  Diabetes coordinator to work on insulin pump?  Low threshold to initiate endotool.  Overall looking more like a primary arrhythmia with underlying cardiomyopathy  Will need LHC etc if wakes up  Mother is POA  Girlfriend at bedside helps with decision making  UOP is dropping off but CVP super high and on pressors: may benefit from [facility] to help figure out what's going on  My cc time 45 mins

## 2023-08-26 NOTE — Consult Note (Signed)
 Cardiology Consultation:   Patient ID: Johnathan Ross MRN: [id number]; DOB: 18-Dec-1970  Admit date: 08/26/2023 Date of Consult: 08/26/2023  Primary Care Provider: Etta Grandchild, MD [facility] Cardiologist: None  [facility] Electrophysiologist:  None    Patient Profile:   Johnathan Ross is a 53 y.o. male with a hx of DM1, hypothyroidism, HTN, multiple sclerosis, hyperlipidemia who is being seen today for the evaluation of cardiac arrest at the request of emergency department.  History of Present Illness:   Johnathan Ross has had several episodes of near syncope and syncope in the past week.  For this, he came to the emergency department on 08/25/2023 for the symptoms, but was eventually discharged.  Per girlfriend at bedside, he syncopized on her yesterday.  Overnight, the patient collapsed, and received bystander CPR by his girlfriend.  Upon EMS arrival, he was found to be in ventricular fibrillation.  He was shocked 8 times, given epinephrine X2 and amiodarone.  He was intubated.  Reportedly with EMS, he also had runs of VT.  By time of arrival to the emergency department, he was having no further episodes of VT or VF, and only frequent PVCs on telemetry.  He had a cough and gag reflex, and thus was sedated with propofol and fentanyl.  Further history cannot be obtained from the patient due to intubation/sedation status.  Past Medical History:  Diagnosis Date   Hyperlipidemia    Hypertension    Hypogonadism in male 11/01/2016   Hypothyroidism    Multiple sclerosis (HCC)    Neuromuscular disorder (HCC)    Proliferative diabetic retinopathy(362.02)    Type 1 diabetes mellitus (HCC) dx'd 1994   Vitamin D insufficiency 11/29/2016    Past Surgical History:  Procedure Laterality Date   EYE SURGERY Bilateral    "laser for diabetic retinopathy"   FRACTURE SURGERY     IR VENO/EXT/UNI LEFT  03/24/2020   OPEN REDUCTION INTERNAL FIXATION (ORIF) TIBIA/FIBULA FRACTURE Right  10/30/2016   ORIF ANKLE FRACTURE Left 2015   ORIF TIBIA FRACTURE Right 10/30/2016   Procedure: OPEN REDUCTION INTERNAL FIXATION (ORIF) TIBIA FIBULA FRACTURE;  Surgeon: Myrene Galas, MD;  Location: [facility] OR;  Service: Orthopedics;  Laterality: Right;   RETINAL LASER PROCEDURE Bilateral    "for diabetic retinopathy"     Home Medications:  Prior to Admission medications   Medication Sig Start Date End Date Taking? Authorizing Provider  baclofen (LIORESAL) 10 MG tablet Take 1 tablet (10 mg total) by mouth 3 (three) times daily. 06/20/23   Sater, Pearletha Furl, MD  dalfampridine 10 MG TB12 Take 1 tablet (10 mg total) by mouth 2 (two) times daily. 06/20/23   Sater, Pearletha Furl, MD  Dasiglucagon HCl (ZEGALOGUE) 0.6 MG/0.6ML SOAJ Inject 1 Act into the skin daily as needed. Patient not taking: Reported on 06/20/2023 06/18/23   Etta Grandchild, MD  gabapentin (NEURONTIN) 100 MG capsule Take 1 capsule (100 mg total) by mouth 3 (three) times daily. Patient not taking: Reported on 06/20/2023 03/12/22   Corwin Levins, MD  glucose blood (CONTOUR NEXT TEST) test strip USE 2 TIMES PER DAY. E10.65 Patient not taking: Reported on 06/20/2023 08/22/21   Reather Littler, MD  levocetirizine (XYZAL) 5 MG tablet TAKE 1 TABLET BY MOUTH EVERY DAY IN THE EVENING 07/18/23   Etta Grandchild, MD  losartan (COZAAR) 50 MG tablet Take 1 tablet (50 mg total) by mouth daily. 07/23/23   Thapa, [place], MD  NOVOLOG 100 UNIT/ML injection USE UP TO  80 UNITS IN INSULIN PUMP DAILY-DX CODE E10.8 01/09/23   Reather Littler, MD  OVER THE COUNTER MEDICATION Sea moss supplement-vitamin and minerals daily Patient not taking: Reported on 06/20/2023    [provider]  silodosin (RAPAFLO) 8 MG CAPS capsule Take 1 capsule (8 mg total) by mouth daily with breakfast. Patient not taking: Reported on 06/20/2023 02/07/22   Sater, Pearletha Furl, MD  simvastatin (ZOCOR) 20 MG tablet Take 1 tablet (20 mg total) by mouth daily at 6 PM. 06/11/23   Etta Grandchild, MD   solifenacin (VESICARE) 5 MG tablet TAKE 1 TABLET (5 MG TOTAL) BY MOUTH DAILY. 11/25/22   Etta Grandchild, MD  SYNTHROID 125 MCG tablet Take 1 tablet by mouth 6 days per week. 06/21/23   Thapa, [place], MD    Inpatient Medications: Scheduled Meds:  Chlorhexidine Gluconate Cloth  6 each Topical Daily   famotidine  20 mg Per Tube BID   mouth rinse  15 mL Mouth Rinse Q2H   Continuous Infusions:  amiodarone 60 mg/hr (08/26/23 0229)   Followed by   amiodarone     fentaNYL infusion INTRAVENOUS 25 mcg/hr (08/26/23 0139)   magnesium sulfate bolus IVPB 2 g (08/26/23 0220)   norepinephrine (LEVOPHED) Adult infusion 5 mcg/min (08/26/23 0143)   piperacillin-tazobactam     piperacillin-tazobactam (ZOSYN)  IV     propofol (DIPRIVAN) infusion 5 mcg/kg/min (08/26/23 0133)   PRN Meds: docusate, mouth rinse, polyethylene glycol  Allergies:    Allergies  Allergen Reactions   Lisinopril Cough   Ivp Dye [Iodinated Contrast Media] Hives and Itching    Social History:   Social History   Socioeconomic History   Marital status: Single    Spouse name: 12   Number of children: 1   Years of education: Not on file   Highest education level: Not on file  Occupational History   Occupation: unemployed  Tobacco Use   Smoking status: Never   Smokeless tobacco: Never  Vaping Use   Vaping status: Never Used  Substance and Sexual Activity   Alcohol use: No   Drug use: No   Sexual activity: Not on file  Other Topics Concern   Not on file  Social History Narrative   Right handed   No caffeine    Lives with girlfriend   Social Drivers of Health   Financial Resource Strain: Low Risk  (09/10/2022)   Overall Financial Resource Strain (CARDIA)    Difficulty of Paying Living Expenses: Not hard at all  Food Insecurity: No Food Insecurity (09/10/2022)   Hunger Vital Sign    Worried About Running Out of Food in the Last Year: Never true    Ran Out of Food in the Last Year: Never true  Transportation  Needs: No Transportation Needs (09/10/2022)   PRAPARE - Administrator, Civil Service (Medical): No    Lack of Transportation (Non-Medical): No  Physical Activity: Sufficiently Active (09/10/2022)   Exercise Vital Sign    Days of Exercise per Week: 5 days    Minutes of Exercise per Session: 60 min  Stress: No Stress Concern Present (09/10/2022)   [employer] of Occupational Health - Occupational Stress Questionnaire    Feeling of Stress : Not at all  Social Connections: Unknown (10/06/2021)   Received from [facility], [facility]   Social Network    Social Network: Not on file  Recent Concern: Social Connections - Moderately Isolated (09/07/2021)   Social Connection and Isolation Panel [  NHANES]    Frequency of Communication with Friends and Family: More than three times a week    Frequency of Social Gatherings with Friends and Family: More than three times a week    Attends Religious Services: Never    Database administrator or Organizations: No    Attends Banker Meetings: Never    Marital Status: Living with partner  Intimate Partner Violence: Not At Risk (09/07/2021)   Humiliation, Afraid, Rape, and Kick questionnaire    Fear of Current or Ex-Partner: No    Emotionally Abused: No    Physically Abused: No    Sexually Abused: No    Family History:   Unable to be obtained due to intubation/sedation Family History  Problem Relation Age of Onset   Diabetes Sister    Colon polyps Maternal Uncle    Colon cancer Maternal Grandmother    Stomach cancer Cousin    Esophageal cancer Neg Hx      ROS:  Unable to be obtained due to intubation/sedation Review of Systems: [y] = yes, [ ]  = no      General: Weight gain [ ] ; Weight loss [ ] ; Anorexia [ ] ; Fatigue [ ] ; Fever [ ] ; Chills [ ] ; Weakness [ ]    Cardiac: Chest pain/pressure [ ] ; Resting SOB [ ] ; Exertional SOB [ ] ; Orthopnea [ ] ; Pedal Edema [ ] ; Palpitations [ ] ; Syncope [ ] ; Presyncope [ ] ;  Paroxysmal nocturnal dyspnea [ ]    Pulmonary: Cough [ ] ; Wheezing [ ] ; Hemoptysis [ ] ; Sputum [ ] ; Snoring [ ]    GI: Vomiting [ ] ; Dysphagia [ ] ; Melena [ ] ; Hematochezia [ ] ; Heartburn [ ] ; Abdominal pain [ ] ; Constipation [ ] ; Diarrhea [ ] ; BRBPR [ ]    GU: Hematuria [ ] ; Dysuria [ ] ; Nocturia [ ]  Vascular: Pain in legs with walking [ ] ; Pain in feet with lying flat [ ] ; Non-healing sores [ ] ; Stroke [ ] ; TIA [ ] ; Slurred speech [ ] ;   Neuro: Headaches [ ] ; Vertigo [ ] ; Seizures [ ] ; Paresthesias [ ] ;Blurred vision [ ] ; Diplopia [ ] ; Vision changes [ ]    Ortho/Skin: Arthritis [ ] ; Joint pain [ ] ; Muscle pain [ ] ; Joint swelling [ ] ; Back Pain [ ] ; Rash [ ]    Psych: Depression [ ] ; Anxiety [ ]    Heme: Bleeding problems [ ] ; Clotting disorders [ ] ; Anemia [ ]    Endocrine: Diabetes [ ] ; Thyroid dysfunction [ ]    Physical Exam/Data:   Vitals:   08/26/23 0130 08/26/23 0142 08/26/23 0145 08/26/23 0212  BP: 114/75 (!) 133/91 (!) 124/92 (!) 132/93  Pulse: (!) 132 (!) 133 (!) 134 (!) 107  Resp: 20 (!) 22 20 20   Temp: (!) 96.9 F (36.1 C) (!) 94.6 F (34.8 C) (!) 92.6 F (33.7 C)   SpO2: 91% 97% 97% 100%   No intake or output data in the 24 hours ending 08/26/23 0303    08/25/2023    1:09 PM 06/20/2023    1:06 PM 06/17/2023    2:48 PM  Last 3 Weights  Weight (lbs) 208 lb 15.9 oz 209 lb 207 lb  Weight (kg) 94.8 kg 94.802 kg 93.895 kg     There is no height or weight on file to calculate BMI.  General: Intubated/sedated HEENT: normal Lymph: no adenopathy Neck: no JVD Endocrine:  No thryomegaly Vascular: No carotid bruits; FA pulses 2+ bilaterally without bruits  Cardiac:  normal S1, S2;  RRR; no murmur; frequent premature beats auscultated Lungs: Ventilated breath sounds abd: soft Ext: no edema Musculoskeletal:  No deformities Skin: warm and dry  Neuro: On able to perform neuroexam  psych:  Normal affect   EKG:  The EKG was personally reviewed and demonstrates: Sinus tachycardia  with ventricular rate 110 bpm, 2 ventricular premature complexes, significant baseline wandering, but in light of that no ST elevations in any leads.  Possible global ST depressions but very mild Telemetry:  Telemetry was personally reviewed and demonstrates: Sinus tachycardia with frequent PVCs, PVC burden between 10-20%.  Relevant CV Studies: None  Laboratory Data:  High Sensitivity Troponin:   Recent Labs  Lab 08/25/23 1433 08/26/23 0120  TROPONINIHS 9 16     Chemistry Recent Labs  Lab 08/25/23 1312 08/26/23 0120 08/26/23 0121 08/26/23 0122 08/26/23 0214  NA 136 138 138 137 137  K 4.6 3.3* 3.1* 3.3* 3.0*  CL 102 104 104  --   --   CO2 27 18*  --   --   --   GLUCOSE 150* 196* 187*  --   --   BUN 10 11 11   --   --   CREATININE 1.19 1.47* 1.40*  --   --   CALCIUM 9.1 8.4*  --   --   --   GFRNONAA >60 57*  --   --   --   ANIONGAP 7 16*  --   --   --     Recent Labs  Lab 08/25/23 1312 08/26/23 0120  PROT 6.6 6.1*  ALBUMIN 3.8 3.3*  AST 27 290*  ALT 22 242*  ALKPHOS 73 105  BILITOT 0.6 0.6   Hematology Recent Labs  Lab 08/25/23 1312 08/26/23 0120 08/26/23 0121 08/26/23 0122 08/26/23 0214  WBC 8.5 14.1*  --   --   --   RBC 5.28 5.93*  --   --   --   HGB 15.1 16.9 17.7* 17.7* 14.6  HCT 45.7 52.4* 52.0 52.0 43.0  MCV 86.6 88.4  --   --   --   MCH 28.6 28.5  --   --   --   MCHC 33.0 32.3  --   --   --   RDW 13.4 13.3  --   --   --   PLT 236 229  --   --   --    BNPNo results for input(s): "BNP", "PROBNP" in the last 168 hours.  DDimer  Recent Labs  Lab 08/25/23 1433  DDIMER 0.34    Radiology/Studies:  CT HEAD WO CONTRAST ( ) Result Date: 08/26/2023 CLINICAL DATA:  Delirium EXAM: CT HEAD WITHOUT CONTRAST TECHNIQUE: Contiguous axial images were obtained from the base of the skull through the vertex without intravenous contrast. RADIATION DOSE REDUCTION: This exam was performed according to the departmental dose-optimization program which includes  automated exposure control, adjustment of the mA and/or kV according to patient size and/or use of iterative reconstruction technique. COMPARISON:  MRI head August 20, 2017. FINDINGS: Brain: No evidence of acute infarction, hemorrhage, hydrocephalus, extra-axial collection or mass lesion/mass effect. White matter lesions better characterized on prior MRI. Vascular: No hyperdense vessel. Skull: No acute fracture. Sinuses/Orbits: Mild paranasal sinus mucosal thickening. No acute orbital findings. Other: No mastoid effusions. IMPRESSION: 1. No evidence of acute intracranial abnormality. 2. White matter lesions better characterized on prior MRI. Electronically Signed   By: Feliberto Harts M.D.   On: 08/26/2023 02:27   CT Angio Chest Pulmonary Embolism (  PE) W or WO Contrast Result Date: 08/26/2023 CLINICAL DATA:  High probability pulmonary embolism, cardiopulmonary arrest EXAM: CT ANGIOGRAPHY CHEST WITH CONTRAST TECHNIQUE: Multidetector CT imaging of the chest was performed using the standard protocol during bolus administration of intravenous contrast. Multiplanar CT image reconstructions and MIPs were obtained to evaluate the vascular anatomy. RADIATION DOSE REDUCTION: This exam was performed according to the departmental dose-optimization program which includes automated exposure control, adjustment of the mA and/or kV according to patient size and/or use of iterative reconstruction technique. CONTRAST:  75mL OMNIPAQUE IOHEXOL 350 MG/ML SOLN COMPARISON:  None Available. FINDINGS: Cardiovascular: Adequate opacification of the pulmonary arterial tree. No intraluminal filling defect identified to suggest acute pulmonary embolism. Central pulmonary arteries are of normal caliber. Moderate left anterior descending coronary artery calcification. Cardiac size within normal limits. No pericardial effusion. No significant atherosclerotic calcification within the thoracic aorta. No aortic aneurysm. Mediastinum/Nodes:  Endotracheal tube and nasogastric tube are in appropriate position. The thyroid gland is atrophic or absent. No definite pathologic adenopathy though imaging is limited by phase of contrast enhancement. The esophagus is unremarkable. Lungs/Pleura: There is extensive dependent pulmonary consolidation throughout the lungs which may relate to aspiration or multifocal infection. No pneumothorax or pleural effusion. Central airways are widely patent. Upper Abdomen: No acute abnormality. Musculoskeletal: No chest wall abnormality. No acute or significant osseous findings. Review of the MIP images confirms the above findings. IMPRESSION: 1. No pulmonary embolism. 2. Moderate left anterior descending coronary artery calcification. 3. Extensive dependent pulmonary consolidation throughout the lungs which may relate to aspiration or multifocal infection. Electronically Signed   By: Helyn Numbers M.D.   On: 08/26/2023 02:14   DG Chest Port 1 View Result Date: 08/26/2023 CLINICAL DATA:  Cardiac arrest, post CPR.  ET tube placement EXAM: PORTABLE CHEST 1 VIEW COMPARISON:  08/25/2023 FINDINGS: Endotracheal tube is 5 cm above the carina. OG tube enters the stomach. Heart and mediastinal contours are within normal limits. Perihilar airspace opacities may reflect edema. No effusions or pneumothorax. No acute bony abnormality. IMPRESSION: Support devices in expected position as above. Bilateral perihilar opacities may reflect edema. Electronically Signed   By: Charlett Nose M.D.   On: 08/26/2023 01:47   DG Abd Portable 1V Result Date: 08/26/2023 CLINICAL DATA:  OG tube placement EXAM: PORTABLE ABDOMEN - 1 VIEW COMPARISON:  None Available. FINDINGS: OG tube tip in the stomach.  Nonobstructive bowel gas pattern. IMPRESSION: OG tube in the stomach. Electronically Signed   By: Charlett Nose M.D.   On: 08/26/2023 01:46   DG Chest Port 1 View Result Date: 08/25/2023 CLINICAL DATA:  Shortness of breath EXAM: PORTABLE CHEST 1 VIEW  COMPARISON:  March 02, 2022 FINDINGS: Evaluation is limited by positioning. The cardiomediastinal silhouette is unchanged in contour. No pleural effusion. No pneumothorax. No acute pleuroparenchymal abnormality. IMPRESSION: No acute cardiopulmonary abnormality. Electronically Signed   By: Meda Klinefelter M.D.   On: 08/25/2023 14:10     Assessment and Plan:   Johnathan Ross is a 53 y.o. male with a hx of DM1, hypothyroidism, HTN, multiple sclerosis, hyperlipidemia who is being seen today for the evaluation VT/VF arrest  VT/VF Cardiac Arrest Frequent Syncopal Episodes Post-Arrest Care Presenting after VF arrest at home with approximately 20 minutes of bystanders and EMS CPR prior to defibrillation.  With EMS he reportedly had multiple runs of VF and VT requiring additional defibrillations.  I do not have those strips at this time to personally review them.  Interestingly the patient has  had multiple episodes of presyncope and syncope in the prior week, and thus I am concerned he was having episodes of arrhythmias during those times.  Following his arrest, he is having frequent PVCs, with burden likely between 10-20% on telemetry.  Is unclear what his neurologic status is at this time.  Given that he has no ST elevations on his ECG, he does not warrant urgent catheterization overnight.  If he has neurologic recovery, coronary angiography and catheterization should be considered at that time.  In the meantime, the primary focus from a cardiology perspective should be avoiding further episodes of cardiac arrest and trying to avoid VT/VF.  He received a few boluses of amiodarone during the code, but has not yet received additional amiodarone.  We recommend first initiating Amio, and if he has further VT/VF next would initiate procainamide if available, or lidocaine if that is not available.  If he has further VT/VF, can also consider deep sedation. -Amiodarone bolus, followed by IV amiodarone  load -Agree with admission to ICU -No indications for Cath Lab activation and coronary angiography overnight -If further VF/VT, recommend procainamide as next antiarrhythmic, if that is not available lidocaine drip -Please maintain magnesium > 2.0, potassium > 3.7, iCal > 1.15, trend electrolytes daily -TTE today -No indications for cooling unless he fevers -If has neurologic recovery, would benefit from coronary evaluation at that time -Also if has neurologic recovery, will likely need secondary prevention ICD      For questions or updates, please contact [facility] Please consult [email or web address] for contact info under     Signed, Freddy Finner, MD  08/26/2023 3:03 AM

## 2023-08-26 NOTE — ED Provider Notes (Signed)
 [facility] DEPT [facility] Emergency Department Provider Note MRN:  [id number]  Arrival date & time: 08/26/23     Chief Complaint   Cardiac Arrest   History of Present Illness   MD SMOLA is a 53 y.o. year-old male with a history of diabetes, hypertension presenting to the ED with chief complaint of cardiac arrest.  Witnessed cardiac arrest with girlfriend this evening.  EMS was called, received bystander CPR.  ROSC obtained with EMS.  Review of Systems  I was unable to obtain a full/accurate HPI, PMH, or ROS due to the patient's cardiac arrest.  Patient's Health History    Past Medical History:  Diagnosis Date   Hyperlipidemia    Hypertension    Hypogonadism in male 11/01/2016   Hypothyroidism    Multiple sclerosis (HCC)    Neuromuscular disorder (HCC)    Proliferative diabetic retinopathy(362.02)    Type 1 diabetes mellitus (HCC) dx'd 1994   Vitamin D insufficiency 11/29/2016    Past Surgical History:  Procedure Laterality Date   EYE SURGERY Bilateral    "laser for diabetic retinopathy"   FRACTURE SURGERY     IR VENO/EXT/UNI LEFT  03/24/2020   OPEN REDUCTION INTERNAL FIXATION (ORIF) TIBIA/FIBULA FRACTURE Right 10/30/2016   ORIF ANKLE FRACTURE Left 2015   ORIF TIBIA FRACTURE Right 10/30/2016   Procedure: OPEN REDUCTION INTERNAL FIXATION (ORIF) TIBIA FIBULA FRACTURE;  Surgeon: Myrene Galas, MD;  Location: [facility] OR;  Service: Orthopedics;  Laterality: Right;   RETINAL LASER PROCEDURE Bilateral    "for diabetic retinopathy"    Family History  Problem Relation Age of Onset   Diabetes Sister    Colon polyps Maternal Uncle    Colon cancer Maternal Grandmother    Stomach cancer Cousin    Esophageal cancer Neg Hx     Social History   Socioeconomic History   Marital status: Single    Spouse name: 12   Number of children: 1   Years of education: Not on file   Highest education level: Not on file  Occupational History   Occupation: unemployed   Tobacco Use   Smoking status: Never   Smokeless tobacco: Never  Vaping Use   Vaping status: Never Used  Substance and Sexual Activity   Alcohol use: No   Drug use: No   Sexual activity: Not on file  Other Topics Concern   Not on file  Social History Narrative   Right handed   No caffeine    Lives with girlfriend   Social Drivers of Health   Financial Resource Strain: Low Risk  (09/10/2022)   Overall Financial Resource Strain (CARDIA)    Difficulty of Paying Living Expenses: Not hard at all  Food Insecurity: No Food Insecurity (09/10/2022)   Hunger Vital Sign    Worried About Running Out of Food in the Last Year: Never true    Ran Out of Food in the Last Year: Never true  Transportation Needs: No Transportation Needs (09/10/2022)   PRAPARE - Administrator, Civil Service (Medical): No    Lack of Transportation (Non-Medical): No  Physical Activity: Sufficiently Active (09/10/2022)   Exercise Vital Sign    Days of Exercise per Week: 5 days    Minutes of Exercise per Session: 60 min  Stress: No Stress Concern Present (09/10/2022)   [employer] of Occupational Health - Occupational Stress Questionnaire    Feeling of Stress : Not at all  Social Connections: Unknown (10/06/2021)   Received from [facility],  [facility]   Social Network    Social Network: Not on file  Recent Concern: Social Connections - Moderately Isolated (09/07/2021)   Social Connection and Isolation Panel [NHANES]    Frequency of Communication with Friends and Family: More than three times a week    Frequency of Social Gatherings with Friends and Family: More than three times a week    Attends Religious Services: Never    Database administrator or Organizations: No    Attends Banker Meetings: Never    Marital Status: Living with partner  Intimate Partner Violence: Not At Risk (09/07/2021)   Humiliation, Afraid, Rape, and Kick questionnaire    Fear of Current or Ex-Partner: No     Emotionally Abused: No    Physically Abused: No    Sexually Abused: No     Physical Exam   Vitals:   08/26/23 0142 08/26/23 0145  BP: (!) 133/91 (!) 124/92  Pulse: (!) 133 (!) 134  Resp: (!) 22 20  Temp: (!) 94.6 F (34.8 C) (!) 92.6 F (33.7 C)  SpO2: 97% 97%    CONSTITUTIONAL: Ill-appearing, NAD NEURO/PSYCH: Unresponsive, intact gag reflex EYES:  eyes equal and reactive ENT/NECK:  no LAD, no JVD CARDIO: Tachycardic rate, well-perfused, normal S1 and S2 PULM:  CTAB no wheezing or rhonchi GI/GU:  non-distended, non-tender MSK/SPINE:  No gross deformities, no edema SKIN:  no rash, atraumatic   *Additional and/or pertinent findings included in MDM below  Diagnostic and Interventional Summary    EKG Interpretation Date/Time:  Monday August 26 2023 01:13:03 EDT Ventricular Rate:  110 PR Interval:  125 QRS Duration:  94 QT Interval:  455 QTC Calculation: 610 R Axis:   -75  Text Interpretation: Sinus tachycardia Ventricular premature complex Biatrial enlargement LAD, consider left anterior fascicular block Low voltage, precordial leads Repol abnrm, severe global ischemia (LM/MVD) Prolonged QT interval Baseline wander in lead(s) III V2 Confirmed by Kennis Carina [phone]) on 08/26/2023 1:58:23 AM       Labs Reviewed  CBC - Abnormal; Notable for the following components:      Result Value   WBC 14.1 (*)    RBC 5.93 (*)    HCT 52.4 (*)    All other components within normal limits  CBG MONITORING, ED - Abnormal; Notable for the following components:   Glucose-Capillary 183 (*)    All other components within normal limits  I-STAT CHEM 8, ED - Abnormal; Notable for the following components:   Potassium 3.1 (*)    Creatinine, Ser 1.40 (*)    Glucose, Bld 187 (*)    Calcium, Ion 0.96 (*)    TCO2 20 (*)    Hemoglobin 17.7 (*)    All other components within normal limits  I-STAT CG4 LACTIC ACID, ED - Abnormal; Notable for the following components:   Lactic Acid,  Venous 5.5 (*)    All other components within normal limits  I-STAT VENOUS BLOOD GAS, ED - Abnormal; Notable for the following components:   pCO2, Ven 40.6 (*)    TCO2 21 (*)    Acid-base deficit 6.0 (*)    Potassium 3.3 (*)    Calcium, Ion 0.96 (*)    Hemoglobin 17.7 (*)    All other components within normal limits  PROTIME-INR  ETHANOL  COMPREHENSIVE METABOLIC PANEL WITH GFR  LACTIC ACID, PLASMA  TROPONIN I (HIGH SENSITIVITY)    DG Chest Port 1 View  Final Result    DG Abd Portable  1V  Final Result    CT HEAD WO CONTRAST ( )    (Results Pending)  CT Angio Chest Pulmonary Embolism (PE) W or WO Contrast    (Results Pending)    Medications  magnesium sulfate IVPB 2 g 50 mL (has no administration in time range)  propofol (DIPRIVAN) 1000 MG/100ML infusion (5 mcg/kg/min  94.8 kg Intravenous New Bag/Given 08/26/23 0133)  fentaNYL in NS (85mcg/ml) infusion-PREMIX (25 mcg/hr Intravenous New Bag/Given 08/26/23 0139)  norepinephrine (LEVOPHED) 4mg  in (0.016 mg/mL) premix infusion (5 mcg/min Intravenous Rate/Dose Change 08/26/23 0143)  sodium chloride 0.9 % bolus 500 mL (500 mLs Intravenous New Bag/Given 08/26/23 0129)  methylPREDNISolone sodium succinate (SOLU-MEDROL) 40 mg/mL injection 40 mg (40 mg Intravenous Given 08/26/23 0136)  diphenhydrAMINE (BENADRYL) injection 50 mg (50 mg Intravenous Given 08/26/23 0137)  midazolam (VERSED) injection 2 mg (2 mg Intravenous Given 08/26/23 0132)     Procedures  /  Critical Care .Critical Care  Performed by: Sabas Sous, MD Authorized by: Sabas Sous, MD   Critical care provider statement:    Critical care time (minutes):  60   Critical care was necessary to treat or prevent imminent or life-threatening deterioration of the following conditions: Cardiac arrest.   Critical care was time spent personally by me on the following activities:  Development of treatment plan with patient or surrogate, discussions with  consultants, evaluation of patient's response to treatment, examination of patient, ordering and review of laboratory studies, ordering and review of radiographic studies, ordering and performing treatments and interventions, pulse oximetry, re-evaluation of patient's condition and review of old charts Procedure Name: Intubation Date/Time: 08/26/2023 2:00 AM  Performed by: Sabas Sous, MDPre-anesthesia Checklist: Patient identified, Patient being monitored, Emergency Drugs available, Timeout performed and Suction available Oxygen Delivery Method: Non-rebreather mask Preoxygenation: Pre-oxygenation with 100% oxygen Induction Type: Rapid sequence Ventilation: Mask ventilation without difficulty Laryngoscope Size: Glidescope and 4 Grade View: Grade I Tube size: 7.5 mm Number of attempts: 1 Airway Equipment and Method: Rigid stylet Placement Confirmation: ETT inserted through vocal cords under direct vision, CO2 detector and Breath sounds checked- equal and bilateral Secured at: 24 cm      ED Course and Medical Decision Making  Initial Impression and Ddx Witnessed arrest, bystander CPR.  Total of 20 to 25 minutes of CPR.  Persistent V-fib arrest per EMS.  Received 8 defibrillations in total.  A few episodes of ROSC with V. tach with pulse, also some ROSC with tachyarrhythmia.  Started on amnio drip.  Received 2 doses of epinephrine.  Narcan was also given in the field in response to small pupils.  On arrival patient has some breathing on his own but is otherwise unresponsive to pain, some blood in the airway from biting his tongue after falling.  Intubated as described above.  Exhibiting some soft blood pressures, started on nor epi drip.  Per EMS and chart review patient has been having some near syncopal episodes for the past week, reassuring evaluation in the emergency department yesterday.  Differential diagnosis includes V-fib or V. tach episodes, PE, ACS, dissection.  Will obtain  CTA imaging of the chest for further clarification.  Patient has a listed contrast allergy.  Will pretreat but then go immediately to scanner when patient is stable enough to do so.  Already intubated and so the risk with this allergy is minimized.  Post ROSC EKG does not show STEMI.  There is evidence of global ischemia.  Will touch base  with cardiology.  Past medical/surgical history that increases complexity of ED encounter: Hypertension, diabetes  Interpretation of Diagnostics I personally reviewed the EKG and my interpretation is as follows: Sinus rhythm with global ischemia, frequent PVC  Elevated lactic acid, leukocytosis noted  Patient Reassessment and Ultimate Disposition/Management     Plan is for ICU admission, cardiology consultation.  Patient management required discussion with the following services or consulting groups:  Intensivist Service and Cardiology  Complexity of Problems Addressed Acute illness or injury that poses threat of life of bodily function  Additional Data Reviewed and Analyzed Further history obtained from: EMS on arrival  Additional Factors Impacting ED Encounter Risk Consideration of hospitalization  Elmer Sow. Pilar Plate, MD [facility] Emergency Medicine [facility] [email or web address]  Final Clinical Impressions(s) / ED Diagnoses     ICD-10-CM   1. Cardiac arrest [facility])  I46.9       ED Discharge Orders     None        Discharge Instructions Discussed with and Provided to Patient:   Discharge Instructions   None      Sabas Sous, MD 08/26/23 [phone]

## 2023-08-26 NOTE — Progress Notes (Signed)
  Echocardiogram 2D Echocardiogram has been performed.  Leda Roys RDCS 08/26/2023, 8:36 AM

## 2023-08-26 NOTE — Procedures (Signed)
 Patient Name: Johnathan Ross  MRN: [id number]  Epilepsy Attending: Charlsie Quest  Referring Physician/Provider: Gleason, Darcella Gasman, PA-C  Date: 08/26/2023 Duration: 22.34 mins  Patient history: 53 year old male status post cardiac arrest.  EEG evaluate for seizure.  Level of alertness:  comatose  AEDs during EEG study: Propofol  Technical aspects: This EEG study was done with scalp electrodes positioned according to the 10-20 International system of electrode placement. Electrical activity was reviewed with band pass filter of 1-70Hz , sensitivity of 7 uV/mm, display speed of 32mm/sec with a 60Hz  notched filter applied as appropriate. EEG data were recorded continuously and digitally stored.  Video monitoring was available and reviewed as appropriate.  Description: EEG showed continuous generalized 3 to 6 Hz theta-delta slowing. Hyperventilation and photic stimulation were not performed.     ABNORMALITY - Continuous slow, generalized  IMPRESSION: This study is suggestive of moderate to severe diffuse encephalopathy. No seizures or epileptiform discharges were seen throughout the recording.  [name] Annabelle Harman

## 2023-08-26 NOTE — Interval H&P Note (Signed)
 History and Physical Interval Note:  08/26/2023 4:26 PM  [name]  has presented today for surgery, with the diagnosis of heart failure.  The various methods of treatment have been discussed with the patient and family. After consideration of risks, benefits and other options for treatment, the patient has consented to  Procedure(s): RIGHT/LEFT HEART CATH AND CORONARY ANGIOGRAPHY (N/A) as a surgical intervention.  The patient's history has been reviewed, patient examined, no change in status, stable for surgery.  I have reviewed the patient's chart and labs.  Questions were answered to the patient's satisfaction.     Vannary Greening

## 2023-08-27 ENCOUNTER — Encounter (HOSPITAL_COMMUNITY): Payer: Self-pay | Admitting: Cardiology

## 2023-08-27 ENCOUNTER — Inpatient Hospital Stay (HOSPITAL_COMMUNITY)

## 2023-08-27 DIAGNOSIS — R579 Shock, unspecified: Secondary | ICD-10-CM | POA: Diagnosis not present

## 2023-08-27 DIAGNOSIS — I5021 Acute systolic (congestive) heart failure: Secondary | ICD-10-CM

## 2023-08-27 DIAGNOSIS — J9600 Acute respiratory failure, unspecified whether with hypoxia or hypercapnia: Secondary | ICD-10-CM

## 2023-08-27 DIAGNOSIS — I469 Cardiac arrest, cause unspecified: Secondary | ICD-10-CM | POA: Diagnosis not present

## 2023-08-27 DIAGNOSIS — N179 Acute kidney failure, unspecified: Secondary | ICD-10-CM | POA: Diagnosis not present

## 2023-08-27 DIAGNOSIS — I5082 Biventricular heart failure: Secondary | ICD-10-CM | POA: Diagnosis not present

## 2023-08-27 LAB — GLUCOSE, CAPILLARY
Glucose-Capillary: 130 mg/dL — ABNORMAL HIGH (ref 70–99)
Glucose-Capillary: 152 mg/dL — ABNORMAL HIGH (ref 70–99)
Glucose-Capillary: 157 mg/dL — ABNORMAL HIGH (ref 70–99)
Glucose-Capillary: 158 mg/dL — ABNORMAL HIGH (ref 70–99)
Glucose-Capillary: 162 mg/dL — ABNORMAL HIGH (ref 70–99)
Glucose-Capillary: 162 mg/dL — ABNORMAL HIGH (ref 70–99)
Glucose-Capillary: 173 mg/dL — ABNORMAL HIGH (ref 70–99)
Glucose-Capillary: 175 mg/dL — ABNORMAL HIGH (ref 70–99)
Glucose-Capillary: 177 mg/dL — ABNORMAL HIGH (ref 70–99)
Glucose-Capillary: 179 mg/dL — ABNORMAL HIGH (ref 70–99)
Glucose-Capillary: 194 mg/dL — ABNORMAL HIGH (ref 70–99)
Glucose-Capillary: 195 mg/dL — ABNORMAL HIGH (ref 70–99)
Glucose-Capillary: 203 mg/dL — ABNORMAL HIGH (ref 70–99)
Glucose-Capillary: 210 mg/dL — ABNORMAL HIGH (ref 70–99)
Glucose-Capillary: 215 mg/dL — ABNORMAL HIGH (ref 70–99)
Glucose-Capillary: 219 mg/dL — ABNORMAL HIGH (ref 70–99)
Glucose-Capillary: 225 mg/dL — ABNORMAL HIGH (ref 70–99)
Glucose-Capillary: 237 mg/dL — ABNORMAL HIGH (ref 70–99)
Glucose-Capillary: 246 mg/dL — ABNORMAL HIGH (ref 70–99)
Glucose-Capillary: 303 mg/dL — ABNORMAL HIGH (ref 70–99)

## 2023-08-27 LAB — TRIGLYCERIDES: Triglycerides: 55 mg/dL (ref <150)

## 2023-08-27 LAB — CBC
HCT: 40.1 % (ref 39.0–52.0)
Hemoglobin: 14.1 g/dL (ref 13.0–17.0)
MCH: 29 pg (ref 26.0–34.0)
MCHC: 35.2 g/dL (ref 30.0–36.0)
MCV: 82.5 fL (ref 80.0–100.0)
Platelets: 235 10*3/uL (ref 150–400)
RBC: 4.86 MIL/uL (ref 4.22–5.81)
RDW: 13.5 % (ref 11.5–15.5)
WBC: 27.1 10*3/uL — ABNORMAL HIGH (ref 4.0–10.5)
nRBC: 0 % (ref 0.0–0.2)

## 2023-08-27 LAB — HEPATIC FUNCTION PANEL
ALT: 133 U/L — ABNORMAL HIGH (ref 0–44)
AST: 95 U/L — ABNORMAL HIGH (ref 15–41)
Albumin: 2.9 g/dL — ABNORMAL LOW (ref 3.5–5.0)
Alkaline Phosphatase: 65 U/L (ref 38–126)
Bilirubin, Direct: 0.2 mg/dL (ref 0.0–0.2)
Indirect Bilirubin: 0.3 mg/dL (ref 0.3–0.9)
Total Bilirubin: 0.5 mg/dL (ref 0.0–1.2)
Total Protein: 5.4 g/dL — ABNORMAL LOW (ref 6.5–8.1)

## 2023-08-27 LAB — POCT I-STAT 7, (LYTES, BLD GAS, ICA,H+H)
Acid-base deficit: 2 mmol/L (ref 0.0–2.0)
Bicarbonate: 19.4 mmol/L — ABNORMAL LOW (ref 20.0–28.0)
Calcium, Ion: 1.13 mmol/L — ABNORMAL LOW (ref 1.15–1.40)
HCT: 42 % (ref 39.0–52.0)
Hemoglobin: 14.3 g/dL (ref 13.0–17.0)
O2 Saturation: 100 %
Patient temperature: 36.5
Potassium: 4 mmol/L (ref 3.5–5.1)
Sodium: 135 mmol/L (ref 135–145)
TCO2: 20 mmol/L — ABNORMAL LOW (ref 22–32)
pCO2 arterial: 25.4 mmHg — ABNORMAL LOW (ref 32–48)
pH, Arterial: 7.489 — ABNORMAL HIGH (ref 7.35–7.45)
pO2, Arterial: 268 mmHg — ABNORMAL HIGH (ref 83–108)

## 2023-08-27 LAB — BASIC METABOLIC PANEL WITH GFR
Anion gap: 10 (ref 5–15)
BUN: 16 mg/dL (ref 6–20)
CO2: 19 mmol/L — ABNORMAL LOW (ref 22–32)
Calcium: 8.4 mg/dL — ABNORMAL LOW (ref 8.9–10.3)
Chloride: 106 mmol/L (ref 98–111)
Creatinine, Ser: 1.3 mg/dL — ABNORMAL HIGH (ref 0.61–1.24)
GFR, Estimated: >60 mL/min (ref >60)
Glucose, Bld: 186 mg/dL — ABNORMAL HIGH (ref 70–99)
Potassium: 4.1 mmol/L (ref 3.5–5.1)
Sodium: 135 mmol/L (ref 135–145)

## 2023-08-27 LAB — COOXEMETRY PANEL
Carboxyhemoglobin: 1.2 % (ref 0.5–1.5)
Methemoglobin: <0.7 % (ref 0.0–1.5)
O2 Saturation: 80.5 %
Total hemoglobin: 14.2 g/dL (ref 12.0–16.0)

## 2023-08-27 LAB — MAGNESIUM: Magnesium: 1.8 mg/dL (ref 1.7–2.4)

## 2023-08-27 MED ORDER — LEVOTHYROXINE SODIUM 25 MCG PO TABS
125.0000 ug | ORAL_TABLET | Freq: Every day | ORAL | Status: DC
  Administered 2023-08-28 – 2023-09-03 (×7): 125 ug via ORAL
  Filled 2023-08-27 (×7): qty 1

## 2023-08-27 MED ORDER — DEXMEDETOMIDINE HCL IN NACL 400 MCG/100ML IV SOLN
0.0000 ug/kg/h | INTRAVENOUS | Status: DC
  Administered 2023-08-27: 0.4 ug/kg/h via INTRAVENOUS
  Filled 2023-08-27: qty 100

## 2023-08-27 MED ORDER — POLYETHYLENE GLYCOL 3350 17 G PO PACK: 17.0000 g | PACK | Freq: Every day | ORAL | Status: DC | PRN

## 2023-08-27 MED ORDER — SENNOSIDES-DOCUSATE SODIUM 8.6-50 MG PO TABS
2.0000 | ORAL_TABLET | Freq: Every day | ORAL | Status: DC
  Administered 2023-08-27: 2 via ORAL
  Filled 2023-08-27: qty 2

## 2023-08-27 MED ORDER — POLYETHYLENE GLYCOL 3350 17 G PO PACK: 17.0000 g | PACK | Freq: Every day | ORAL | Status: DC

## 2023-08-27 MED ORDER — FUROSEMIDE 10 MG/ML IJ SOLN
40.0000 mg | Freq: Once | INTRAMUSCULAR | Status: AC
Start: 2023-08-27 — End: 2023-08-27
  Administered 2023-08-27: 40 mg via INTRAVENOUS
  Filled 2023-08-27: qty 4

## 2023-08-27 MED ORDER — MAGNESIUM SULFATE 2 GM/50ML IV SOLN
2.0000 g | Freq: Once | INTRAVENOUS | Status: AC
  Administered 2023-08-27: 2 g via INTRAVENOUS
  Filled 2023-08-27: qty 50

## 2023-08-27 MED ORDER — SENNOSIDES-DOCUSATE SODIUM 8.6-50 MG PO TABS: 2.0000 | ORAL_TABLET | Freq: Every day | ORAL | Status: DC

## 2023-08-27 MED ORDER — DOCUSATE SODIUM 50 MG/5ML PO LIQD
100.0000 mg | Freq: Two times a day (BID) | ORAL | Status: DC | PRN
Start: 2023-08-27 — End: 2023-08-28

## 2023-08-27 MED ORDER — DALFAMPRIDINE ER 10 MG PO TB12
10.0000 mg | ORAL_TABLET | Freq: Two times a day (BID) | ORAL | Status: DC
  Administered 2023-08-27 – 2023-09-03 (×13): 10 mg via ORAL
  Filled 2023-08-27 (×16): qty 1

## 2023-08-27 MED ORDER — LEVOTHYROXINE SODIUM 25 MCG PO TABS
125.0000 ug | ORAL_TABLET | Freq: Every day | ORAL | Status: DC
  Administered 2023-08-27: 125 ug
  Filled 2023-08-27: qty 1

## 2023-08-27 NOTE — Evaluation (Signed)
 Physical Therapy Evaluation Patient Details Name: RHYSE LOUX MRN: [id number] DOB: 1971/04/22 Today's Date: 08/27/2023  History of Present Illness  53 yo male admitted 3/31 after collapse and CPR of grossly 20 min with Vfib shock x 8. Intubated 3/31-4/1. 3/31 RHC with Rt heart failure. PMhx: HTN, DM, diabetic retinopathy, multiple sclerosis, Rt tib/fib fx  Clinical Impression  Pt lethargic with decreased attention, recall and memory. Pt oriented to place and person and even though situation discussed grossly 6x during session pt unable to retain and recall information. Pt lives with girlfriend who works and pt is normally able to walk with assist of environment or RW with LB weaker than UB at baseline. Pt today still with swan ganz and cooling blanket as well as impaired cognition limiting safe mobility OOB. Pt with decreased strength, cognition, mobility and function who will benefit from acute therapy to maximize mobility and independence to decrease burden of care.   HR 75 98% on 4L        If plan is discharge home, recommend the following: A lot of help with walking and/or transfers;A lot of help with bathing/dressing/bathroom;Assist for transportation;Assistance with cooking/housework;Supervision due to cognitive status;Help with stairs or ramp for entrance   Can travel by private vehicle        Equipment Recommendations Other (comment) (TBD with progression)  Recommendations for Other Services  Rehab consult;OT consult;Speech consult    Functional Status Assessment Patient has had a recent decline in their functional status and demonstrates the ability to make significant improvements in function in a reasonable and predictable amount of time.     Precautions / Restrictions Precautions Precautions: Fall;Other (comment) Recall of Precautions/Restrictions: Impaired Precaution/Restrictions Comments: MS with LB weakness, swan ganz      Mobility  Bed Mobility Overal bed  mobility: Needs Assistance Bed Mobility: Supine to Sit           General bed mobility comments: supine to sit via bed features due to impaired cognition, swan, gross weakness. In chair position pt able to use bil rails and pull trunk fully off surface x 5 trials as well as lift arms over head. Pt unable to significantly move LB and coupled with maintained cooling blanket and swan ganz pt not yet appropriate to attempt pivot to EOB or standing without +2 assist    Transfers                   General transfer comment: unable to attempt this session    Ambulation/Gait                  Stairs            Wheelchair Mobility     Tilt Bed    Modified Rankin (Stroke Patients Only)       Balance                                             Pertinent Vitals/Pain Pain Assessment Pain Assessment: No/denies pain    Home Living Family/patient expects to be discharged to:: Private residence Living Arrangements: Spouse/significant other Available Help at Discharge: Family;Available 24 hours/day Type of Home: House Home Access: Stairs to enter Entrance Stairs-Rails: Left;Right;Can reach both Entrance Stairs-Number of Steps: 4   Home Layout: One level Home Equipment: Agricultural consultant (2 wheels);Cane - single point      Prior  Function Prior Level of Function : Independent/Modified Independent             Mobility Comments: walks with furniture or walls at home, RW for long distance ADLs Comments: completes ADLs without assist, enjoys drawing     Extremity/Trunk Assessment   Upper Extremity Assessment Upper Extremity Assessment: Generalized weakness (grossly 4/5)    Lower Extremity Assessment Lower Extremity Assessment: RLE deficits/detail;LLE deficits/detail RLE Deficits / Details: knee extension 2/5, knee flexion 2/5, hip flexion 1/5, dorsiflexion 1/5 LLE Deficits / Details: knee extension 2/5, knee flexion 2/5, hip flexion 1/5,  dorsiflexion 1/5    Cervical / Trunk Assessment Cervical / Trunk Assessment: Normal  Communication   Communication Communication: No apparent difficulties Factors Affecting Communication: Other (comment) (decreased vocal volume s/p extubation)    Cognition Arousal: Lethargic Behavior During Therapy: WFL for tasks assessed/performed   PT - Cognitive impairments: Orientation, Awareness, Memory, Attention, Problem solving   Orientation impairments: Time, Situation                     Following commands: Intact       Cueing Cueing Techniques: Verbal cues     General Comments      Exercises General Exercises - Lower Extremity Short Arc Quad: AROM, Both, 10 reps, Seated Straight Leg Raises: PROM, Both, 10 reps, Seated   Assessment/Plan    PT Assessment Patient needs continued PT services  PT Problem List Decreased strength;Decreased range of motion;Decreased activity tolerance;Decreased balance;Decreased mobility;Decreased safety awareness;Decreased knowledge of use of DME;Decreased cognition;Decreased coordination       PT Treatment Interventions DME instruction;Gait training;Functional mobility training;Therapeutic activities;Therapeutic exercise;Patient/family education;Stair training;Cognitive remediation;Neuromuscular re-education;Balance training    PT Goals (Current goals can be found in the Care Plan section)  Acute Rehab PT Goals Patient Stated Goal: return home, draw PT Goal Formulation: With patient/family Time For Goal Achievement: 09/10/23 Potential to Achieve Goals: Good    Frequency Min 3X/week     Co-evaluation               AM-PAC PT "6 Clicks" Mobility  Outcome Measure Help needed turning from your back to your side while in a flat bed without using bedrails?: A Lot Help needed moving from lying on your back to sitting on the side of a flat bed without using bedrails?: Total Help needed moving to and from a bed to a chair (including  a wheelchair)?: Total Help needed standing up from a chair using your arms (e.g., wheelchair or bedside chair)?: Total Help needed to walk in hospital room?: Total Help needed climbing 3-5 steps with a railing? : Total 6 Click Score: 7    End of Session   Activity Tolerance: Patient limited by fatigue Patient left: in bed;with call bell/phone within reach;with bed alarm set;with family/visitor present;with nursing/sitter in room Nurse Communication: Mobility status;Precautions PT Visit Diagnosis: Other abnormalities of gait and mobility (R26.89);Difficulty in walking, not elsewhere classified (R26.2);Muscle weakness (generalized) (M62.81)    Time: 4098-1191 PT Time Calculation (min) (ACUTE ONLY): 25 min   Charges:   PT Evaluation $PT Eval High Complexity: 1 High   PT General Charges $$ ACUTE PT VISIT: 1 Visit         Merryl Hacker, PT Acute Rehabilitation Services Office: [phone]   Dalaya Suppa B Makyna Niehoff 08/27/2023, 2:00 PM

## 2023-08-27 NOTE — Progress Notes (Signed)
 Advanced Heart Failure Rounding Note  Cardiologist: Dr. Gasper Lloyd (new)  Chief Complaint: Out of hospital VF Arrest   Patient Profile   Johnathan Ross is a 53 y.o. male with a hx of DM1, hypothyroidism, HTN, multiple sclerosis, hyperlipidemia admitted for OOH VT/VF arrest.   Pertinent Data:   Shocked 8 times, given epinephrine x2 and amiodarone. Intubated.   Initial K 3.3, Mg 2.0  Hs trop 290>>150 LA 5.5>>2.2 >>4.1 Post arrest EKG NSR w/ low voltage and short P-R interval. No STE. QTc ok 367 ms. UDS + Benzos  Echo EF 30-35%, no LVH. RV mildly reduced  R/LHC: Mild nonobstructive CAD, severely elevated pre capillary filling pressures. Hemodynamics (PAPi, CVP/PCWP ratio) consistent with suboptimal RV function >>started on Milrinone 0.25 mcg/kg/min Ost LAD to Prox LAD lesion is 30% stenosed.   HEMODYNAMICS:. RA:                  12 mmHg (mean) RV:                  35/10-14 mmHg PA:                  38/20 mmHg (26 mean) PCWP:            14 mmHg (mean)                                      Estimated Fick CO/CI: 4.3 L/min, 1.94 L/min/m2   Thermodilution CO/CI: 4.39 L/min, 1.98 L/min/m2                                           TPG                 12  mmHg                                            PVR                 2.8 Wood Units  PAPi                1.5     LVEDP: 18  EEG 3/31: moderate to severe diffuse encephalopathy. No seizures or epileptiform discharges   Subjective:    Remains intubated. Sedated on propofol. TTM protocol.   No further VT/VF. Amio gtt @ 30/hr. K 4.1, Mg 1.8   On Milrinone 0.25 + NE 8. UOP improved, 1.8L out yesterday.  Scr 1.3  Swan #s CVP 8 PAP 26/13 CO 3.89 TD CI 1.75 Co-ox 81%  FICK CI 3.45   Remains on Rocephin.    Objective:   Weight Range: 98.9 kg Body mass index is 28.77 kg/m.   Vital Signs:   Temp:  [96.6 F (35.9 C)-99.5 F (37.5 C)] 97.5 F (36.4 C) (04/01 0700) Pulse Rate:  [0-81] 64 (04/01 0700) Resp:  [0-30] 29  (04/01 0700) BP: (85-152)/(40-95) 115/63 (04/01 0600) SpO2:  [97 %-100 %] 99 % (04/01 0700) Arterial Line BP: (77-123)/(49-73) 119/56 (04/01 0700) FiO2 (%):  [60 %-90 %] 60 % (04/01 0810) Weight:  [98.2 kg-98.9 kg] 98.9 kg (04/01 0500) Last BM Date :  (PTA)  Weight  change: Filed Weights   08/26/23 0800 08/26/23 1451 08/27/23 0500  Weight: 98.2 kg 98.2 kg 98.9 kg    Intake/Output:   Intake/Output Summary (Last 24 hours) at 08/27/2023 0814 Last data filed at 08/27/2023 0700 Gross per 24 hour  Intake 3200.52 ml  Output 1840 ml  Net 1360.52 ml      Physical Exam    CVP 8  General:  intubated and sedated  HEENT: + ETT, normal Neck: supple. JVD 8-9cm. +RIJ Swan. Cor: PMI nondisplaced. Regular rate & rhythm. No rubs, gallops or murmurs. Lungs: clear Abdomen: soft, nontender, nondistended.  Extremities: no cyanosis, clubbing, rash, edema Neuro: intubated and sedated GU: + foely    Telemetry   NSR 60s, no further VT   EKG    No new EKG to review   Labs    CBC Recent Labs    08/26/23 0434 08/26/23 0439 08/27/23 0516 08/27/23 0520  WBC 16.2*  --  27.1*  --   HGB 15.3   < > 14.1 14.3  HCT 46.3   < > 40.1 42.0  MCV 85.4  --  82.5  --   PLT 254  --  235  --    < > = values in this interval not displayed.   Basic Metabolic Panel Recent Labs    04/54/09 0434 08/26/23 0439 08/27/23 0516 08/27/23 0520  NA 134*   < > 135 135  K 4.7   < > 4.1 4.0  CL 105  --  106  --   CO2 17*  --  19*  --   GLUCOSE 283*  --  186*  --   BUN 11  --  16  --   CREATININE 1.16  --  1.30*  --   CALCIUM 7.6*  --  8.4*  --   MG 2.0  --  1.8  --   PHOS 3.6  --   --   --    < > = values in this interval not displayed.   Liver Function Tests Recent Labs    08/26/23 0120 08/26/23 0719  AST 290* 228*  ALT 242* 200*  ALKPHOS 105 85  BILITOT 0.6 1.2  PROT 6.1* 5.9*  ALBUMIN 3.3* 3.2*   No results for input(s): "LIPASE", "AMYLASE" in the last 72 hours. Cardiac Enzymes Recent  Labs    08/26/23 0719  CKTOTAL 1,253*    BNP: BNP (last 3 results) Recent Labs    08/26/23 0645  BNP 102.9*    ProBNP (last 3 results) No results for input(s): "PROBNP" in the last 8760 hours.   D-Dimer Recent Labs    08/25/23 1433  DDIMER 0.34   Hemoglobin A1C No results for input(s): "HGBA1C" in the last 72 hours. Fasting Lipid Panel Recent Labs    08/27/23 0515  TRIG 55   Thyroid Function Tests Recent Labs    08/26/23 0434  TSH 2.213    Other results:   Imaging    CARDIAC CATHETERIZATION Result Date: 08/26/2023   Ost LAD to Prox LAD lesion is 30% stenosed. HEMODYNAMICS:. RA:   12 mmHg (mean) RV:   35/10-14 mmHg PA:   38/20 mmHg (26 mean) PCWP:  14 mmHg (mean)    Estimated Fick CO/CI: 4.3 L/min, 1.94 L/min/m2  Thermodilution CO/CI: 4.39 L/min, 1.98 L/min/m2    TPG    12  mmHg     PVR     2.8 Wood Units PAPi      1.5  LVEDP: 18  IMPRESSION: Severely elevated pre capillary filling pressures Hemodynamics (PAPi, CVP/PCWP ratio) consistent with suboptimal RV function. Mild nonobstructive coronary artery disease.   ECHOCARDIOGRAM COMPLETE Result Date: 08/26/2023    ECHOCARDIOGRAM REPORT   Patient Name:   Johnathan Ross Date of Exam: 08/26/2023 Medical Rec #:  [id number]        Height:       73.0 in Accession #:    [id number]       Weight:       216.5 lb Date of Birth:  1970-10-19        BSA:          2.225 m Patient Age:    53 years         BP:           110/65 mmHg Patient Gender: M                HR:           79 bpm. Exam Location:  Inpatient Procedure: 2D Echo, Color Doppler, Cardiac Doppler and Intracardiac            Opacification Agent (Both Spectral and Color Flow Doppler were            utilized during procedure). Indications:    Cardiac Arrest I46.9  History:        Patient has no prior history of Echocardiogram examinations.                 Risk Factors:Diabetes and Dyslipidemia.  Sonographer:    Harriette Bouillon RDCS Referring Phys: [phone] LAURA R GLEASON  IMPRESSIONS  1. No left ventricular thrombus is seen (Definity contrast used). Left ventricular ejection fraction, by estimation, is 30 to 35%. The left ventricle has moderately decreased function. The left ventricle demonstrates global hypokinesis. Left ventricular  diastolic parameters are indeterminate.  2. Right ventricular systolic function is mildly reduced. The right ventricular size is normal.  3. The mitral valve is normal in structure. No evidence of mitral valve regurgitation. No evidence of mitral stenosis.  4. The aortic valve is tricuspid. Aortic valve regurgitation is not visualized. No aortic stenosis is present. FINDINGS  Left Ventricle: No left ventricular thrombus is seen (Definity contrast used). Left ventricular ejection fraction, by estimation, is 30 to 35%. The left ventricle has moderately decreased function. The left ventricle demonstrates global hypokinesis. The  left ventricular internal cavity size was normal in size. There is no left ventricular hypertrophy. Left ventricular diastolic parameters are indeterminate. Right Ventricle: The right ventricular size is normal. No increase in right ventricular wall thickness. Right ventricular systolic function is mildly reduced. Left Atrium: Left atrial size was normal in size. Right Atrium: Right atrial size was normal in size. Pericardium: There is no evidence of pericardial effusion. Mitral Valve: The mitral valve is normal in structure. No evidence of mitral valve regurgitation. No evidence of mitral valve stenosis. Tricuspid Valve: The tricuspid valve is normal in structure. Tricuspid valve regurgitation is not demonstrated. Aortic Valve: The aortic valve is tricuspid. Aortic valve regurgitation is not visualized. No aortic stenosis is present. Pulmonic Valve: The pulmonic valve was normal in structure. Pulmonic valve regurgitation is not visualized. No evidence of pulmonic stenosis. Aorta: The aortic root and ascending aorta are  structurally normal, with no evidence of dilitation. IAS/Shunts: No atrial level shunt detected by color flow Doppler.  LEFT VENTRICLE PLAX 2D LVIDd:         5.20 cm  Diastology LVIDs:         4.80 cm      LV e' medial:    8.38 cm/s LV PW:         0.90 cm      LV E/e' medial:  6.6 LV IVS:        0.90 cm      LV e' lateral:   8.05 cm/s LVOT diam:     1.90 cm      LV E/e' lateral: 6.8 LV SV:         37 LV SV Index:   16 LVOT Area:     2.84 cm  LV Volumes (MOD) LV vol d, MOD A2C: 97.5 ml LV vol d, MOD A4C: 152.0 ml LV vol s, MOD A2C: 72.2 ml LV vol s, MOD A4C: 113.0 ml LV SV MOD A2C:     25.3 ml LV SV MOD A4C:     152.0 ml LV SV MOD BP:      27.2 ml RIGHT VENTRICLE             IVC RV S prime:     10.00 cm/s  IVC diam: 1.90 cm TAPSE (M-mode): 1.5 cm LEFT ATRIUM             Index        RIGHT ATRIUM          Index LA diam:        2.90 cm 1.30 cm/m   RA Area:     8.44 cm LA Vol (A2C):   48.8 ml 21.93 ml/m  RA Volume:   17.40 ml 7.82 ml/m LA Vol (A4C):   24.1 ml 10.83 ml/m LA Biplane Vol: 34.8 ml 15.64 ml/m  AORTIC VALVE LVOT Vmax:   84.60 cm/s LVOT Vmean:  62.000 cm/s LVOT VTI:    0.129 m  AORTA Ao Root diam: 2.90 cm Ao Asc diam:  2.80 cm MITRAL VALVE MV Area (PHT): 5.16 cm    SHUNTS MV Decel Time: 147 msec    Systemic VTI:  0.13 m MV E velocity: 55.00 cm/s  Systemic Diam: 1.90 cm MV A velocity: 62.90 cm/s MV E/A ratio:  0.87 Mihai Croitoru MD Electronically signed by Thurmon Fair MD Signature Date/Time: 08/26/2023/1:20:18 PM    Final    US RENAL Result Date: 08/26/2023 CLINICAL DATA:  Acute kidney injury. EXAM: RENAL / URINARY TRACT ULTRASOUND COMPLETE COMPARISON:  None. FINDINGS: Right Kidney: Renal measurements: 9.7 x 3.7 x 5.1 cm = volume: 96 mL. Increased echogenicity. No hydronephrosis. Left Kidney: Renal measurements: 9.8 x 4.5 x 4.7 cm = volume: 108 mL. Increased echogenicity. No hydronephrosis. Bladder: Decompressed by Foley catheter. Other: None. IMPRESSION: No hydronephrosis. Increased renal  parenchymal echogenicity suggests medical renal disease. Electronically Signed   By: Kennith Center M.D.   On: 08/26/2023 11:54   EEG adult Result Date: 08/26/2023 Charlsie Quest, MD     08/26/2023 10:37 AM Patient Name: Johnathan Ross MRN: [id number] Epilepsy Attending: Charlsie Quest Referring Physician/Provider: Gleason, Darcella Gasman, PA-C Date: 08/26/2023 Duration: 22.34 mins Patient history: 53 year old male status post cardiac arrest.  EEG evaluate for seizure. Level of alertness:  comatose AEDs during EEG study: Propofol Technical aspects: This EEG study was done with scalp electrodes positioned according to the 10-20 International system of electrode placement. Electrical activity was reviewed with band pass filter of 1-70Hz , sensitivity of 7 uV/mm, display speed of 55mm/sec with a 60Hz  notched filter applied as appropriate. EEG data  were recorded continuously and digitally stored.  Video monitoring was available and reviewed as appropriate. Description: EEG showed continuous generalized 3 to 6 Hz theta-delta slowing. Hyperventilation and photic stimulation were not performed.   ABNORMALITY - Continuous slow, generalized IMPRESSION: This study is suggestive of moderate to severe diffuse encephalopathy. No seizures or epileptiform discharges were seen throughout the recording. Priyanka Annabelle Harman     Medications:     Scheduled Medications:  acetaminophen  650 mg Oral Q4H   Or   acetaminophen (TYLENOL) oral liquid 160 mg/5 mL  650 mg Per Tube Q4H   Or   acetaminophen  650 mg Rectal Q4H   aspirin  81 mg Per Tube Once   Chlorhexidine Gluconate Cloth  6 each Topical Daily   docusate  100 mg Per Tube BID   famotidine  20 mg Per Tube BID   feeding supplement (PROSource TF20)  60 mL Per Tube TID   fentaNYL (SUBLIMAZE) injection  50 mcg Intravenous Once   heparin injection (subcutaneous)  5,000 Units Subcutaneous Q8H   mouth rinse  15 mL Mouth Rinse Q2H   polyethylene glycol  17 g Per Tube Daily    sodium chloride flush  3-10 mL Intravenous Q12H    Infusions:  amiodarone 30 mg/hr (08/27/23 0700)   cefTRIAXone (ROCEPHIN)  IV Stopped (08/26/23 1319)   feeding supplement (VITAL 1.5 CAL) 50 mL/hr at 08/27/23 0700   fentaNYL infusion INTRAVENOUS 75 mcg/hr (08/27/23 0700)   insulin 12 Units/hr (08/27/23 0800)   magnesium sulfate     milrinone 0.25 mcg/kg/min (08/27/23 0700)   norepinephrine (LEVOPHED) Adult infusion 9 mcg/min (08/27/23 0700)   propofol (DIPRIVAN) infusion 25 mcg/kg/min (08/27/23 0735)    PRN Medications: [START ON 08/28/2023] acetaminophen **OR** [START ON 08/28/2023] acetaminophen (TYLENOL) oral liquid 160 mg/5 mL **OR** [START ON 08/28/2023] acetaminophen, busPIRone **OR** busPIRone, dextrose, docusate, fentaNYL, magnesium sulfate, ondansetron (ZOFRAN) IV, mouth rinse, polyethylene glycol, sodium chloride flush     Assessment/Plan   1. OOH VT/VF Arrest - ~ 20 min bystander CPR. ROSC after multiple defibs, Epi + amio  - no STE on post arrest EKG. Noted short PR interval, low voltage. QTc 367 ms - initial K 3.0, Mg 2.0>>given K supp  - Hs trop 290>>150 - Echo EF 30-35%, no LVH. RV mildly reduced  - R/LHC nonobs CAD - no further VT/VF. Continue amio gtt - Keep K > 4.0, Mg > 2.0  - will need cMRI and ICD prior to d/c pending meaning neurological recovery - Waiting neuro prognostication once off sedation. Neurology following. On TTM protocol   - brain MRI per neuro   2. 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 RV mildly reduced. No prior study for comparison  - uncertain if baseline CM that triggered VT/VF vs cardiac stunning post arrest  - R/LHC nonobs CAD, suboptimal RV fx w/ low output (RA 12, PAP 38/20, PCW 14 CI 1.9) - now on Milrinone 0.25 + NE 8. Co-ox 81%, FICK CI 3.5. UOP improving, CVP 8   - Plan cMRI  - will need ICD for secondary prevention, if neurologically recovers  - no oral GDMT currently  given need for NE for BP support  - CVP 8. Hold diuretics for now given septic shock component.  - wean NE as tolerated   3. Vasoplegic/ Septic Shock  - PCT 21 - ? Aspiration. On rocephin per CCM  - continue NE for BP support.  Wean as tolerated. Keep MAP > 65 - follow PCT level and LA for clearance  - follow culture data, Bcx NGTD   4. Acute Hypoxic Respiratory Failure - intubated post arrest - vent management per CCM  - abx for suspected aspiration PNA      Length of Stay: 1  Vadim Centola, PA-C  08/27/2023, 8:14 AM  Advanced Heart Failure Team Pager [phone] (M-F; 7a - 5p)  Please contact [facility] for night-coverage after hours (5p -7a ) and weekends on [email or web address]

## 2023-08-27 NOTE — Plan of Care (Signed)
  Problem: Cardiac: Goal: Ability to achieve and maintain adequate cardiopulmonary perfusion will improve Outcome: Progressing   Problem: Clinical Measurements: Goal: Diagnostic test results will improve Outcome: Progressing Goal: Respiratory complications will improve Outcome: Progressing Goal: Cardiovascular complication will be avoided Outcome: Progressing   Problem: Neurologic: Goal: Promote progressive neurologic recovery Outcome: Progressing

## 2023-08-27 NOTE — Progress Notes (Signed)
 During RT assessment, ETT was 21 @ lip. RT advanced ETT by 3cm back to 24 @ lip.

## 2023-08-27 NOTE — Plan of Care (Signed)
  Problem: Cardiac: Goal: Ability to achieve and maintain adequate cardiopulmonary perfusion will improve Outcome: Progressing   Problem: Clinical Measurements: Goal: Diagnostic test results will improve 08/27/2023 1019 by Tarri Guilfoil, Weber Cooks, RN Outcome: Progressing 08/27/2023 1018 by Maxx Calaway, Weber Cooks, RN Outcome: Progressing Goal: Respiratory complications will improve Outcome: Progressing Goal: Cardiovascular complication will be avoided Outcome: Progressing   Problem: Cardiac: Goal: Ability to achieve and maintain adequate cardiopulmonary perfusion will improve Outcome: Progressing   Problem: Neurologic: Goal: Promote progressive neurologic recovery Outcome: Progressing

## 2023-08-27 NOTE — Progress Notes (Signed)
 NAME:  Johnathan Ross, MRN:  [id number], DOB:  03-18-1971, LOS: 1 ADMISSION DATE:  08/26/2023, CONSULTATION DATE: 08/27/23 REFERRING MD:  Pilar Plate, CHIEF COMPLAINT:  cardiac arrest   History of Present Illness:  Johnathan Ross is a 53 y.o. M with PMH of relapsing remitting MS on Lemtrada, HL, HTN, hypothyroidism, type 1 DM who presented to the ED after apparently collapsing and requiring bystander CPR before EMS arrived and he was found to be in Vfib.  Arrest witnessed by his girlfriend. Pt was shocked eight times, given Epi x2 and Amiodarone. CPR lasted approximately 20 minutes.  He was intubated and brought to the ED where he was in sinus rhythm with prolonged Qtc.  He required peripheral norepi.  A CT head and CTA chest did not reveal any intracranial abnormalities and no PE though had extensive consolidation throughout the lungs, likely aspiration.  PCCM consulted for admission. 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   Significant Hospital Events: Including procedures, antibiotic start and stop dates in addition to other pertinent events   3/31 Vfib arrest, approx 20 mins ACLS, ETT and ICU admit  Interim History / Subjective:  Waking up more, following commands.  Objective   Blood pressure 104/63, pulse 97, temperature (!) 97 F (36.1 C), resp. rate 16, weight 98.9 kg, SpO2 100%. PAP: (21-27)/(9-15) 21/11 CVP:  [6 mmHg-8 mmHg] 7 mmHg PCWP:  [11 mmHg-14 mmHg] 11 mmHg CO:  [3.5 L/min-4.1 L/min] 4.1 L/min CI:  [1.55 L/min/m2-1.85 L/min/m2] 1.85 L/min/m2  Vent Mode: PRVC FiO2 (%):  [60  %-90 %] 60 % Set Rate:  [28 bmp] 28 bmp Vt Set:  [630 mL] 630 mL PEEP:  [5 cmH20] 5 cmH20 Plateau Pressure:  [20 cmH20-23 cmH20] 20 cmH20   Intake/Output Summary (Last 24 hours) at 08/27/2023 1117 Last data filed at 08/27/2023 1100 Gross per 24 hour  Intake 3756.39 ml  Output 2060 ml  Net 1696.39 ml   Filed Weights   08/26/23 0800 08/26/23 1451 08/27/23 0500  Weight: 98.2 kg 98.2 kg 98.9 kg    No distress Cooling pads in place Lungs sound okay, triggers vent Ext warm Abd soft Unable to move LE, chronic Pupils pinpoint  Cr a little up CXR some aspiration vs. Ongoing edema WBC shot up for some reason  Assessment & Plan:    Witnessed out of hospital Vfib arrest x 20 min intermittent- trops flat, EF down, query primary arrythmia Post arrest shock, mixed- on inotropes, mostly RV driven failure at this point Post arrest AKI- stable Post arrest ALI- today's pending Post arrest encephalopathy- markedly improved today Post arrest resp failure with probable aspiration- improved History of HTN, HLD Type 1 DM- on home insulin pump, diabetes team managing Multiple Sclerosis -on Lemtrada Hypothyroidism- okay TSH, continue synthroid  - Avoid fevers, can probably take off cooling pads - Ceftriaxone x 5 days - Lasix x 1, see if we can work toward extubation - Inotropes per AHF - Wean sedation and see if we can work toward extubation - Eventual GDMT and ?LHC and ?cardiac MRI depending on AKI, encephalopathy, and vent liberation  Best Practice (right click and "Reselect all SmartList Selections" daily)  Diet/type: TF DVT prophylaxis prophylactic heparin  Pressure ulcer(s): N/A GI prophylaxis: H2B Lines: N/A Foley:  Yes, and it is still needed Code Status:  full code Last date of multidisciplinary goals of care discussion [family updated at bedside]  34 min cc time Myrla Halsted MD PCCM

## 2023-08-27 NOTE — Inpatient Diabetes Management (Signed)
 Inpatient Diabetes Program Recommendations  AACE/ADA: New Consensus Statement on Inpatient Glycemic Control (2015)  Target Ranges:  Prepandial:   less than 140 mg/dL      Peak postprandial:   less than 180 mg/dL (1-2 hours)      Critically ill patients:  140 - 180 mg/dL   Lab Results  Component Value Date   GLUCAP 130 (H) 08/27/2023   HGBA1C 7.3 (H) 07/08/2023    Review of Glycemic Control  Latest Reference Range & Units 08/27/23 07:33 08/27/23 08:32 08/27/23 09:29 08/27/23 10:39 08/27/23 11:31  Glucose-Capillary 70 - 99 mg/dL 161 (H) 096 (H) 045 (H) 152 (H) 130 (H)  (H): Data is abnormally high Diabetes history: Type 1 DM Outpatient Diabetes medications: Medtronic 780G- Novolog U-100 Current orders for Inpatient glycemic control: IV insulin   Inpatient Diabetes Program Recommendations:     When ready to transition patient, consider: -Lantus 16 units two hours prior to discontinuation, then every day to follow -Novolog 2-6 units Q4H until able to tolerate intake.   I anticipate that patient will require need for titration. Once appropriate, family plans to have needed supplies for insulin pump while inpatient.   Basal insulin  12A0.1 units/hour 4A        1.15 units/hour 6A        1.20 units/hour 10P0.15 units/hour   Total daily basal insulin: 22.2 units/24 hours   Carb [place]age [place]       1:15     1 unit for every 15 grams of carbohydrates 0700    1:8       1 unit for every 8 grams of carbohydrates 1200    1:9.5    1 unit for every 9.5 grams of carbohydrates 1800    1:121 unit for every 12 grams of carbohydrates 2200    1:20     1 unit for every 20 grams of carbohydrates Insulin Sensitivity 1:35 unit drops blood glucose 35 mg/dl  Thanks, Lujean Rave, MSN, RNC-OB Diabetes Coordinator [phone] (8a-5p)

## 2023-08-27 NOTE — Progress Notes (Signed)
 Inpatient Rehab Admissions Coordinator:   Per therapy recommendations, patient was screened for CIR candidacy by Megan Salon, MS, CCC-SLP. At this time, Pt. is not yet tolerating OOB and is not at a level to tolerate the intensity of CIR; however,   Pt. may have potential to progress to becoming a potential CIR candidate, so CIR admissions team will follow and monitor for progress and participation with therapies and place consult order if Pt. appears to be an appropriate candidate. Please contact me with any questions.   Megan Salon, MS, CCC-SLP Rehab Admissions Coordinator  [phone] (celll) [phone] (office)

## 2023-08-27 NOTE — Procedures (Signed)
 Extubation Procedure Note  Patient Details:   Name: Johnathan Ross DOB: Oct 19, 1970 MRN: [id number]   Airway Documentation:    Vent end date: 08/27/23 Vent end time: 1159   Evaluation  O2 sats: stable throughout Complications: No apparent complications Patient did tolerate procedure well. Bilateral Breath Sounds: Clear, Diminished   Yes  Pt extubated per order to 4L [place], sats currently 96%. Pt had a positive cuff leak, good cough, able to state name and no stridor noted.   Kerri Perches 08/27/2023, 11:59 AM

## 2023-08-28 ENCOUNTER — Inpatient Hospital Stay (HOSPITAL_COMMUNITY)

## 2023-08-28 DIAGNOSIS — I472 Ventricular tachycardia, unspecified: Secondary | ICD-10-CM

## 2023-08-28 DIAGNOSIS — J9601 Acute respiratory failure with hypoxia: Secondary | ICD-10-CM | POA: Diagnosis not present

## 2023-08-28 DIAGNOSIS — I5021 Acute systolic (congestive) heart failure: Secondary | ICD-10-CM | POA: Diagnosis not present

## 2023-08-28 DIAGNOSIS — E119 Type 2 diabetes mellitus without complications: Secondary | ICD-10-CM | POA: Diagnosis not present

## 2023-08-28 DIAGNOSIS — I469 Cardiac arrest, cause unspecified: Secondary | ICD-10-CM | POA: Diagnosis not present

## 2023-08-28 DIAGNOSIS — I5082 Biventricular heart failure: Secondary | ICD-10-CM | POA: Diagnosis not present

## 2023-08-28 LAB — BASIC METABOLIC PANEL WITH GFR
Anion gap: 10 (ref 5–15)
BUN: 26 mg/dL — ABNORMAL HIGH (ref 6–20)
CO2: 22 mmol/L (ref 22–32)
Calcium: 8.2 mg/dL — ABNORMAL LOW (ref 8.9–10.3)
Chloride: 102 mmol/L (ref 98–111)
Creatinine, Ser: 1.33 mg/dL — ABNORMAL HIGH (ref 0.61–1.24)
GFR, Estimated: >60 mL/min (ref >60)
Glucose, Bld: 252 mg/dL — ABNORMAL HIGH (ref 70–99)
Potassium: 4.1 mmol/L (ref 3.5–5.1)
Sodium: 134 mmol/L — ABNORMAL LOW (ref 135–145)

## 2023-08-28 LAB — CBC
HCT: 38.7 % — ABNORMAL LOW (ref 39.0–52.0)
Hemoglobin: 13.2 g/dL (ref 13.0–17.0)
MCH: 28.8 pg (ref 26.0–34.0)
MCHC: 34.1 g/dL (ref 30.0–36.0)
MCV: 84.3 fL (ref 80.0–100.0)
Platelets: 179 10*3/uL (ref 150–400)
RBC: 4.59 MIL/uL (ref 4.22–5.81)
RDW: 14 % (ref 11.5–15.5)
WBC: 23.9 10*3/uL — ABNORMAL HIGH (ref 4.0–10.5)
nRBC: 0 % (ref 0.0–0.2)

## 2023-08-28 LAB — GLUCOSE, CAPILLARY
Glucose-Capillary: 101 mg/dL — ABNORMAL HIGH (ref 70–99)
Glucose-Capillary: 107 mg/dL — ABNORMAL HIGH (ref 70–99)
Glucose-Capillary: 132 mg/dL — ABNORMAL HIGH (ref 70–99)
Glucose-Capillary: 138 mg/dL — ABNORMAL HIGH (ref 70–99)
Glucose-Capillary: 141 mg/dL — ABNORMAL HIGH (ref 70–99)
Glucose-Capillary: 159 mg/dL — ABNORMAL HIGH (ref 70–99)
Glucose-Capillary: 163 mg/dL — ABNORMAL HIGH (ref 70–99)
Glucose-Capillary: 183 mg/dL — ABNORMAL HIGH (ref 70–99)
Glucose-Capillary: 196 mg/dL — ABNORMAL HIGH (ref 70–99)
Glucose-Capillary: 215 mg/dL — ABNORMAL HIGH (ref 70–99)
Glucose-Capillary: 245 mg/dL — ABNORMAL HIGH (ref 70–99)
Glucose-Capillary: 269 mg/dL — ABNORMAL HIGH (ref 70–99)
Glucose-Capillary: 273 mg/dL — ABNORMAL HIGH (ref 70–99)

## 2023-08-28 LAB — COOXEMETRY PANEL
Carboxyhemoglobin: 1 % (ref 0.5–1.5)
Methemoglobin: <0.7 % (ref 0.0–1.5)
O2 Saturation: 63.2 %
Total hemoglobin: 13.4 g/dL (ref 12.0–16.0)

## 2023-08-28 LAB — MAGNESIUM: Magnesium: 2.2 mg/dL (ref 1.7–2.4)

## 2023-08-28 LAB — CULTURE, RESPIRATORY W GRAM STAIN

## 2023-08-28 MED ORDER — POLYETHYLENE GLYCOL 3350 17 G PO PACK
17.0000 g | PACK | Freq: Two times a day (BID) | ORAL | Status: DC
  Administered 2023-08-28 – 2023-08-31 (×4): 17 g via ORAL
  Filled 2023-08-28 (×4): qty 1

## 2023-08-28 MED ORDER — HALOPERIDOL LACTATE 5 MG/ML IJ SOLN
INTRAMUSCULAR | Status: AC
  Filled 2023-08-28: qty 1

## 2023-08-28 MED ORDER — DIAZEPAM 5 MG/ML IJ SOLN
5.0000 mg | Freq: Once | INTRAMUSCULAR | Status: AC
  Administered 2023-08-29: 5 mg via INTRAVENOUS
  Filled 2023-08-28 (×2): qty 2

## 2023-08-28 MED ORDER — LOSARTAN POTASSIUM 25 MG PO TABS
25.0000 mg | ORAL_TABLET | Freq: Every day | ORAL | Status: DC
Start: 2023-08-28 — End: 2023-08-29
  Administered 2023-08-28: 25 mg via ORAL
  Filled 2023-08-28: qty 1

## 2023-08-28 MED ORDER — HALOPERIDOL LACTATE 5 MG/ML IJ SOLN: 5.0000 mg | Freq: Once | INTRAMUSCULAR | Status: DC

## 2023-08-28 MED ORDER — HYDRALAZINE HCL 20 MG/ML IJ SOLN
10.0000 mg | INTRAMUSCULAR | Status: DC | PRN
  Administered 2023-08-28: 10 mg via INTRAVENOUS
  Filled 2023-08-28 (×2): qty 1

## 2023-08-28 MED ORDER — INSULIN GLARGINE-YFGN 100 UNIT/ML ~~LOC~~ SOLN
35.0000 [IU] | Freq: Two times a day (BID) | SUBCUTANEOUS | Status: DC
  Administered 2023-08-28 – 2023-09-01 (×8): 35 [IU] via SUBCUTANEOUS
  Filled 2023-08-28 (×11): qty 0.35

## 2023-08-28 MED ORDER — LORAZEPAM 2 MG/ML IJ SOLN: 0.5000 mg | Freq: Once | INTRAMUSCULAR | Status: DC

## 2023-08-28 MED ORDER — INSULIN ASPART 100 UNIT/ML IJ SOLN
0.0000 [IU] | INTRAMUSCULAR | Status: DC
  Administered 2023-08-28: 2 [IU] via SUBCUTANEOUS
  Administered 2023-08-28 (×2): 8 [IU] via SUBCUTANEOUS
  Administered 2023-08-29: 2 [IU] via SUBCUTANEOUS
  Administered 2023-08-29 (×2): 5 [IU] via SUBCUTANEOUS
  Administered 2023-08-29: 2 [IU] via SUBCUTANEOUS
  Administered 2023-08-29: 5 [IU] via SUBCUTANEOUS
  Administered 2023-08-29: 3 [IU] via SUBCUTANEOUS
  Administered 2023-08-30: 15 [IU] via SUBCUTANEOUS
  Administered 2023-08-30: 2 [IU] via SUBCUTANEOUS
  Administered 2023-08-31 (×2): 5 [IU] via SUBCUTANEOUS

## 2023-08-28 MED ORDER — SENNOSIDES-DOCUSATE SODIUM 8.6-50 MG PO TABS
2.0000 | ORAL_TABLET | Freq: Two times a day (BID) | ORAL | Status: DC
  Administered 2023-08-28 – 2023-09-01 (×4): 2 via ORAL
  Filled 2023-08-28 (×8): qty 2

## 2023-08-28 MED ORDER — DIAZEPAM 5 MG PO TABS
5.0000 mg | ORAL_TABLET | Freq: Once | ORAL | Status: DC | PRN
Start: 2023-08-28 — End: 2023-09-03

## 2023-08-28 MED ORDER — LABETALOL HCL 5 MG/ML IV SOLN: 10.0000 mg | INTRAVENOUS | Status: DC | PRN

## 2023-08-28 MED ORDER — POLYETHYLENE GLYCOL 3350 17 G PO PACK
17.0000 g | PACK | Freq: Every day | ORAL | Status: DC
  Filled 2023-08-28: qty 1

## 2023-08-28 MED ORDER — POTASSIUM CHLORIDE CRYS ER 20 MEQ PO TBCR
20.0000 meq | EXTENDED_RELEASE_TABLET | Freq: Once | ORAL | Status: AC
  Administered 2023-08-28: 20 meq via ORAL
  Filled 2023-08-28: qty 1

## 2023-08-28 MED ORDER — DIAZEPAM 5 MG/ML IJ SOLN
INTRAMUSCULAR | Status: AC
  Filled 2023-08-28: qty 2

## 2023-08-28 NOTE — TOC Progression Note (Signed)
 Transition of Care [facility]) - Progression Note    Patient Details  Name: Johnathan Ross MRN: [id number] Date of Birth: 24-Nov-1970  [facility] CM/SW Contact  Nicanor Bake Phone Number: [phone] 08/28/2023, 10:44 AM  Clinical Narrative:   HF CSW met with patient and patients mother at bedside. Patient and his mother stated that they do not have any questions at this time. Will continue to follow for dc.   TOC will continue following.     Expected Discharge Plan: IP Rehab Facility Barriers to Discharge: Continued Medical Work up  Expected Discharge Plan and Services   Discharge Planning Services: CM Consult   Living arrangements for the past 2 months: Single Family Home                                       Social Determinants of Health (SDOH) Interventions SDOH Screenings   Food Insecurity: No Food Insecurity (09/10/2022)  Housing: Low Risk  (09/10/2022)  Transportation Needs: No Transportation Needs (09/10/2022)  Utilities: Not At Risk (09/10/2022)  Alcohol Screen: Low Risk  (09/07/2021)  Depression (PHQ2-9): Low Risk  (09/10/2022)  Financial Resource Strain: Low Risk  (09/10/2022)  Physical Activity: Sufficiently Active (09/10/2022)  Social Connections: Unknown (10/06/2021)   Received from [facility], [facility]  Recent Concern: Social Connections - Moderately Isolated (09/07/2021)  Stress: No Stress Concern Present (09/10/2022)  Tobacco Use: Low Risk  (08/25/2023)    Readmission Risk Interventions     No data to display

## 2023-08-28 NOTE — Progress Notes (Addendum)
 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  Has been diuresed, extubated, and now is becoming hypertensive.  PA pressures within normal limits.  Plan to wean milrinone per cardiology  eICU Interventions  Wean milrinone to 0.125  Add hydralazine   0105 -patient had a run of nonsustained VT vs SVT with heart rates in the 170s.  Blood pressure maintained, cardiac output and remaining hemodynamics appear stable.  Will recheck electrolytes.  Maintain amiodarone 30 mg    0322 -stable.  Having restlessness with difficulty getting comfortable.  QTc within normal limits.  Will give Ativan x 1.  Intervention Category Intermediate Interventions: Hypertension - evaluation and management  Shadara Lopez 08/28/2023, 12:14 AM

## 2023-08-28 NOTE — Inpatient Diabetes Management (Signed)
 Inpatient Diabetes Program Recommendations  AACE/ADA: New Consensus Statement on Inpatient Glycemic Control (2015)  Target Ranges:  Prepandial:   less than 140 mg/dL      Peak postprandial:   less than 180 mg/dL (1-2 hours)      Critically ill patients:  140 - 180 mg/dL   Lab Results  Component Value Date   GLUCAP 141 (H) 08/28/2023   HGBA1C 7.3 (H) 07/08/2023    Review of Glycemic Control  Diabetes history: T1DM Outpatient Diabetes medications: Insulin pump Current orders for Inpatient glycemic control: Transitioned from IV insulin to Lantus 35 units BID and Novolog 0-15 Q4H  Basal insulin  12A0.1 units/hour 4A        1.15 units/hour 6A        1.20 units/hour 10P0.15 units/hour   Total daily basal insulin: 22.2 units/24 hours   Carb [place]       1:15     1 unit for every 15 grams of carbohydrates 0700    1:8       1 unit for every 8 grams of carbohydrates 1200    1:9.5    1 unit for every 9.5 grams of carbohydrates 1800    1:121 unit for every 12 grams of carbohydrates 2200    1:20     1 unit for every 20 grams of carbohydrates Insulin Sensitivity 1:35 unit drops blood glucose 35 mg/dl    Concern for hypoglycemia in Type 1 DM with insulin pump history.  Inpatient Diabetes Program Recommendations:    Consider Lantus 20 units Q24H  Will need Novolog meal coverage if eating > 50%.  Consider Novolog 6 units TID with meals if eating > 50%. Will need titration of Novolog meal coverage  Secure text to Pharm D regarding basal insulin orders of Lantus 35 BID and hypoglycemia risk in Type 1 pt. Both steroids and TFs have been discontinued.  Will continue to follow glucose trends.  Thank you. Ailene Ards, RD, LDN, CDCES Inpatient Diabetes Coordinator [phone]

## 2023-08-28 NOTE — Consult Note (Addendum)
 ELECTROPHYSIOLOGY CONSULT NOTE    Patient ID: Johnathan Ross MRN: [id number], DOB/AGE: [birth year] 53 y.o.  Admit date: 08/26/2023 Date of Consult: 08/28/2023  Primary Physician: Etta Grandchild, MD Primary Cardiologist: None  Electrophysiologist: Dr. Jimmey Ralph   Referring Provider: Dr. Gasper Lloyd   Patient Profile: Johnathan Ross is a 53 y.o. male with a history of HTN, HLD, relapsing-remitting MS on [place]tr[place] (walks with a walker at baseline), hypothyroidism, DM I who is being seen today for the evaluation of VT/VF OOH cardiac arrest at the request of Dr. Katrinka Blazing.  HPI:  Johnathan Ross is a 53 y.o. male who presented to [facility] after suffering an OOH cardiac arrest.    The patients sister reports on Fri 08/23/23 he had several episodes of dizziness / lightheadedness and one episode of falling but was able to get himself up.  He described a flutter in his chest. She saw him on Saturday 3/29 at a family birthday celebration and he was doing ok.  Wynelle Link 3/30 the patient was seen in the ER with shortness of breath and near syncope.  Work up was reassuring and he was discharged home. On 3/31 he had several episodes of dizziness, lightheadedness and one episode of falling backward in bed.  He woke up and remembered feeling heavy. They went to bed about 10 minutes later.    The patient was with his girlfriend on 08/26/23 at the time of arrest.  He reportedly collapsed and received bystander CPR before EMS arrival.  On EMS arrival, he was in VF.  He was shocked x8, received EPI x2 and amiodarone.  ACLS efforts lasted approximately 20 minutes.  He was intubated and transferred to ER.  On arrival, he was in SR with prolonged QTc. He required NE for hypotension. CT head / CTA were negative, CTA chest was negative for PE but showed extensive consolidation throughout concerning for aspiration. Initial EKG on arrival showed ST with PVC's (lots of baseline artifact), subsequent EKG one minute later showed  ST 110 with PVC's and prolonged QT (also with extensive artifact). Subsequent EKG shows NSR 76 bpm. He was admitted per PCCM to ICU on mechanical ventilation. He woke and was following commands.   The patient was subsequently extubated and is on 4L [place]. He is being treated with ceftriaxone for aspiration PNA.  Amiodarone for VT / arrhythmia.  Cardiac MRI is pending.   He denies chest pain, palpitations, dyspnea, PND, orthopnea, nausea, vomiting, dizziness, syncope, edema, weight gain, or early satiety.   Labs Potassium4.1 (04/02 0104) Magnesium  2.2 (04/02 0104) Creatinine, ser  1.33* (04/02 0104) PLT  179 (04/02 0104) HGB  13.2 (04/02 0104) WBC 23.9* (04/02 0104) Troponin I (High Sensitivity)323* (03/31 0919).    Past Medical History:  Diagnosis Date   Hyperlipidemia    Hypertension    Hypogonadism in male 11/01/2016   Hypothyroidism    Multiple sclerosis (HCC)    Neuromuscular disorder (HCC)    Proliferative diabetic retinopathy(362.02)    Type 1 diabetes mellitus (HCC) dx'd 1994   Vitamin D insufficiency 11/29/2016     Surgical History:  Past Surgical History:  Procedure Laterality Date   EYE SURGERY Bilateral    "laser for diabetic retinopathy"   FRACTURE SURGERY     IR VENO/EXT/UNI LEFT  03/24/2020   OPEN REDUCTION INTERNAL FIXATION (ORIF) TIBIA/FIBULA FRACTURE Right 10/30/2016   ORIF ANKLE FRACTURE Left 2015   ORIF TIBIA FRACTURE Right 10/30/2016   Procedure: OPEN REDUCTION INTERNAL FIXATION (  ORIF) TIBIA FIBULA FRACTURE;  Surgeon: Myrene Galas, MD;  Location[facility];  Service: Orthopedics;  Laterality: Right;   RETINAL LASER PROCEDURE Bilateral    "for diabetic retinopathy"   RIGHT/LEFT HEART CATH AND CORONARY ANGIOGRAPHY N/A 08/26/2023   Procedure: RIGHT/LEFT HEART CATH AND CORONARY ANGIOGRAPHY;  Surgeon: Dorthula Nettles, DO;  Location: [facility] INVASIVE CV LAB;  Service: Cardiovascular;  Laterality: N/A;     Medications Prior to Admission  Medication Sig Dispense Refill  Last Dose/Taking   baclofen (LIORESAL) 10 MG tablet Take 1 tablet (10 mg total) by mouth 3 (three) times daily. 270 tablet 3 08/25/2023   dalfampridine 10 MG TB12 Take 1 tablet (10 mg total) by mouth 2 (two) times daily. 180 tablet 3 08/25/2023   levocetirizine (XYZAL) 5 MG tablet TAKE 1 TABLET BY MOUTH EVERY DAY IN THE EVENING 90 tablet 1 08/24/2023   losartan (COZAAR) 50 MG tablet Take 1 tablet (50 mg total) by mouth daily. 90 tablet 3 08/25/2023   NOVOLOG 100 UNIT/ML injection USE UP TO 80 UNITS IN INSULIN PUMP DAILY-DX CODE E10.8 70 mL 2 08/25/2023   simvastatin (ZOCOR) 20 MG tablet Take 1 tablet (20 mg total) by mouth daily at 6 PM. 90 tablet 0 08/24/2023   SYNTHROID 125 MCG tablet Take 1 tablet by mouth 6 days per week. 78 tablet 3 08/25/2023    Inpatient Medications:   Chlorhexidine Gluconate Cloth  6 each Topical Daily   dalfampridine  10 mg Oral BID   heparin injection (subcutaneous)  5,000 Units Subcutaneous Q8H   insulin aspart  0-15 Units Subcutaneous Q4H   insulin glargine-yfgn  35 Units Subcutaneous BID   levothyroxine  125 mcg Oral Q0600   losartan  25 mg Oral Daily   mouth rinse  15 mL Mouth Rinse Q2H   polyethylene glycol  17 g Oral Daily   senna-docusate  2 tablet Oral QHS   sodium chloride flush  3-10 mL Intravenous Q12H    Allergies:  Allergies  Allergen Reactions   Zestril [Lisinopril] Cough   Ivp Dye [Iodinated Contrast Media] Hives and Itching    Family History  Problem Relation Age of Onset   Diabetes Sister    Colon polyps Maternal Uncle    Colon cancer Maternal Grandmother    Stomach cancer Cousin    Esophageal cancer Neg Hx      Physical Exam: Vitals:   08/28/23 0400 08/28/23 0500 08/28/23 0600 08/28/23 0700  BP:  139/64 (!) 145/82 (!) 156/79  Pulse: (!) 106 (!) 107 (!) 141 (!) 122  Resp: 15 19 (!) 32 20  Temp: 98.8 F (37.1 C) 99.1 F (37.3 C) 99.1 F (37.3 C) 99.3 F (37.4 C)  TempSrc:      SpO2: 100% 90% (!) 87% 90%  Weight:  101.1 kg       GEN- ill appearing adult male lying in bed in NAD, A&O x 3, normal affect   Neuro: awake / alert, oriented to self, family, short term memory loss noted HEENT: Normocephalic, atraumatic Lungs- diminished breath sounds, normal effort.  Heart- Regular rate and rhythm, No M/G/R.  GI- Soft, NT, ND.  Extremities- No clubbing, cyanosis, or edema   Radiology/Studies: DG CHEST PORT 1 VIEW Addendum Date: 08/27/2023 ADDENDUM REPORT: 08/27/2023 10:19 ADDENDUM: Critical Value/emergent results were called by telephone at the time of interpretation on 08/27/2023 at 10:18 am to nurse Alissa, who verbally acknowledged these results and will contact the caring service urgently. Electronically Signed   By: Scarlette Shorts  Chales Abrahams M.D.   On: 08/27/2023 10:19   Result Date: 08/27/2023 CLINICAL DATA:  Respiratory failure EXAM: PORTABLE CHEST 1 VIEW COMPARISON:  Chest x-ray 08/26/2023 and older FINDINGS: Stable enteric tube and right IJ line. New left IJ line with tip at the right hilum, Swan-Ganz catheter. ET tube in place but is somewhat high, above the clavicles and approximately 11 cm above the carina. Recommend this be advanced 5 cm and repeat x-ray. Increasing vascular congestion. Normal cardiopericardial silhouette. No pneumothorax or effusion. Overlapping cardiac leads. Critical Value/emergent results were called by telephone at the time of interpretation on 08/27/2023 at 10:13 am to provider [facility] E[facility] , who verbally acknowledged these results. IMPRESSION: High position of the ET tube with tip well above the clavicles. Recommend advancement by 5 cm and repeat x-ray. Increasing vascular congestion. New left IJ Swan-Ganz catheter with tip at the right lung hilum. No pneumothorax Electronically Signed: By: Karen Kays M.D. On: 08/27/2023 10:13   CARDIAC CATHETERIZATION Result Date: 08/26/2023   Ost LAD to Prox LAD lesion is 30% stenosed. HEMODYNAMICS:. RA:   12 mmHg (mean) RV:   35/10-14 mmHg PA:   38/20 mmHg (26 mean) PCWP:   14 mmHg (mean)    Estimated Fick CO/CI: 4.3 L/min, 1.94 L/min/m2  Thermodilution CO/CI: 4.39 L/min, 1.98 L/min/m2    TPG    12  mmHg     PVR     2.8 Wood Units PAPi      1.5  LVEDP: 18 IMPRESSION: Severely elevated pre capillary filling pressures Hemodynamics (PAPi, CVP/PCWP ratio) consistent with suboptimal RV function. Mild nonobstructive coronary artery disease.   ECHOCARDIOGRAM COMPLETE Result Date: 08/26/2023    ECHOCARDIOGRAM REPORT   Patient Name:   Johnathan Ross Date of Exam: 08/26/2023 Medical Rec #:  [id number]        Height:       73.0 in Accession #:    [id number]       Weight:       216.5 lb Date of Birth:  1970/12/02        BSA:          2.225 m Patient Age:    53 years         BP:           110/65 mmHg Patient Gender: M                HR:           79 bpm. Exam Location:  Inpatient Procedure: 2D Echo, Color Doppler, Cardiac Doppler and Intracardiac            Opacification Agent (Both Spectral and Color Flow Doppler were            utilized during procedure). Indications:    Cardiac Arrest I46.9  History:        Patient has no prior history of Echocardiogram examinations.                 Risk Factors:Diabetes and Dyslipidemia.  Sonographer:    Harriette Bouillon RDCS Referring Phys: [phone] LAURA R GLEASON IMPRESSIONS  1. No left ventricular thrombus is seen (Definity contrast used). Left ventricular ejection fraction, by estimation, is 30 to 35%. The left ventricle has moderately decreased function. The left ventricle demonstrates global hypokinesis. Left ventricular  diastolic parameters are indeterminate.  2. Right ventricular systolic function is mildly reduced. The right ventricular size is normal.  3. The mitral valve is normal in structure. No evidence  of mitral valve regurgitation. No evidence of mitral stenosis.  4. The aortic valve is tricuspid. Aortic valve regurgitation is not visualized. No aortic stenosis is present. FINDINGS  Left Ventricle: No left ventricular thrombus is seen (Definity  contrast used). Left ventricular ejection fraction, by estimation, is 30 to 35%. The left ventricle has moderately decreased function. The left ventricle demonstrates global hypokinesis. The  left ventricular internal cavity size was normal in size. There is no left ventricular hypertrophy. Left ventricular diastolic parameters are indeterminate. Right Ventricle: The right ventricular size is normal. No increase in right ventricular wall thickness. Right ventricular systolic function is mildly reduced. Left Atrium: Left atrial size was normal in size. Right Atrium: Right atrial size was normal in size. Pericardium: There is no evidence of pericardial effusion. Mitral Valve: The mitral valve is normal in structure. No evidence of mitral valve regurgitation. No evidence of mitral valve stenosis. Tricuspid Valve: The tricuspid valve is normal in structure. Tricuspid valve regurgitation is not demonstrated. Aortic Valve: The aortic valve is tricuspid. Aortic valve regurgitation is not visualized. No aortic stenosis is present. Pulmonic Valve: The pulmonic valve was normal in structure. Pulmonic valve regurgitation is not visualized. No evidence of pulmonic stenosis. Aorta: The aortic root and ascending aorta are structurally normal, with no evidence of dilitation. IAS/Shunts: No atrial level shunt detected by color flow Doppler.  LEFT VENTRICLE PLAX 2D LVIDd:         5.20 cm      Diastology LVIDs:         4.80 cm      LV e' medial:    8.38 cm/s LV PW:         0.90 cm      LV E/e' medial:  6.6 LV IVS:        0.90 cm      LV e' lateral:   8.05 cm/s LVOT diam:     1.90 cm      LV E/e' lateral: 6.8 LV SV:         37 LV SV Index:   16 LVOT Area:     2.84 cm  LV Volumes (MOD) LV vol d, MOD A2C: 97.5 ml LV vol d, MOD A4C: 152.0 ml LV vol s, MOD A2C: 72.2 ml LV vol s, MOD A4C: 113.0 ml LV SV MOD A2C:     25.3 ml LV SV MOD A4C:     152.0 ml LV SV MOD BP:      27.2 ml RIGHT VENTRICLE             IVC RV S prime:     10.00 cm/s   IVC diam: 1.90 cm TAPSE (M-mode): 1.5 cm LEFT ATRIUM             Index        RIGHT ATRIUM          Index LA diam:        2.90 cm 1.30 cm/m   RA Area:     8.44 cm LA Vol (A2C):   48.8 ml 21.93 ml/m  RA Volume:   17.40 ml 7.82 ml/m LA Vol (A4C):   24.1 ml 10.83 ml/m LA Biplane Vol: 34.8 ml 15.64 ml/m  AORTIC VALVE LVOT Vmax:   84.60 cm/s LVOT Vmean:  62.000 cm/s LVOT VTI:    0.129 m  AORTA Ao Root diam: 2.90 cm Ao Asc diam:  2.80 cm MITRAL VALVE MV Area (PHT): 5.16 cm    SHUNTS  MV Decel Time: 147 msec    Systemic VTI:  0.13 m MV E velocity: 55.00 cm/s  Systemic Diam: 1.90 cm MV A velocity: 62.90 cm/s MV E/A ratio:  0.87 Mihai Croitoru MD Electronically signed by Thurmon Fair MD Signature Date/Time: 08/26/2023/1:20:18 PM    Final    US RENAL Result Date: 08/26/2023 CLINICAL DATA:  Acute kidney injury. EXAM: RENAL / URINARY TRACT ULTRASOUND COMPLETE COMPARISON:  None. FINDINGS: Right Kidney: Renal measurements: 9.7 x 3.7 x 5.1 cm = volume: 96 mL. Increased echogenicity. No hydronephrosis. Left Kidney: Renal measurements: 9.8 x 4.5 x 4.7 cm = volume: 108 mL. Increased echogenicity. No hydronephrosis. Bladder: Decompressed by Foley catheter. Other: None. IMPRESSION: No hydronephrosis. Increased renal parenchymal echogenicity suggests medical renal disease. Electronically Signed   By: Kennith Center M.D.   On: 08/26/2023 11:54   EEG adult Result Date: 08/26/2023 Charlsie Quest, MD     08/26/2023 10:37 AM Patient Name: Johnathan Ross MRN: [id number] Epilepsy Attending: Charlsie Quest Referring Physician/Provider: Gleason, Darcella Gasman, PA-C Date: 08/26/2023 Duration: 22.34 mins Patient history: 53 year old male status post cardiac arrest.  EEG evaluate for seizure. Level of alertness:  comatose AEDs during EEG study: Propofol Technical aspects: This EEG study was done with scalp electrodes positioned according to the 10-20 International system of electrode placement. Electrical activity was reviewed with band  pass filter of 1-70Hz , sensitivity of 7 uV/mm, display speed of 80mm/sec with a 60Hz  notched filter applied as appropriate. EEG data were recorded continuously and digitally stored.  Video monitoring was available and reviewed as appropriate. Description: EEG showed continuous generalized 3 to 6 Hz theta-delta slowing. Hyperventilation and photic stimulation were not performed.   ABNORMALITY - Continuous slow, generalized IMPRESSION: This study is suggestive of moderate to severe diffuse encephalopathy. No seizures or epileptiform discharges were seen throughout the recording. Charlsie Quest   DG CHEST PORT 1 VIEW Result Date: 08/26/2023 CLINICAL DATA:  Central line placement. EXAM: PORTABLE CHEST 1 VIEW COMPARISON:  Earlier same day FINDINGS: Endotracheal tube tip is 6.4 cm above the base of the carina. The NG tube passes into the stomach although the distal tip position is not included on the film. Right IJ central line is new in the interval with tip overlying the low SVC level near the RA junction. The central right greater than left airspace disease is similar to prior. No substantial pleural effusion although neither costophrenic angle has been included on the film. Telemetry leads overlie the chest. IMPRESSION: 1. Right IJ central line tip overlies the low SVC level near the RA junction. No pneumothorax. 2. No other significant interval change. Electronically Signed   By: Kennith Center M.D.   On: 08/26/2023 05:02   CT HEAD WO CONTRAST ( ) Result Date: 08/26/2023 CLINICAL DATA:  Delirium EXAM: CT HEAD WITHOUT CONTRAST TECHNIQUE: Contiguous axial images were obtained from the base of the skull through the vertex without intravenous contrast. RADIATION DOSE REDUCTION: This exam was performed according to the departmental dose-optimization program which includes automated exposure control, adjustment of the mA and/or kV according to patient size and/or use of iterative reconstruction technique.  COMPARISON:  MRI head August 20, 2017. FINDINGS: Brain: No evidence of acute infarction, hemorrhage, hydrocephalus, extra-axial collection or mass lesion/mass effect. White matter lesions better characterized on prior MRI. Vascular: No hyperdense vessel. Skull: No acute fracture. Sinuses/Orbits: Mild paranasal sinus mucosal thickening. No acute orbital findings. Other: No mastoid effusions. IMPRESSION: 1. No evidence of acute intracranial abnormality. 2. [place] Asters  matter lesions better characterized on prior MRI. Electronically Signed   By: Feliberto Harts M.D.   On: 08/26/2023 02:27   CT Angio Chest Pulmonary Embolism (PE) W or WO Contrast Result Date: 08/26/2023 CLINICAL DATA:  High probability pulmonary embolism, cardiopulmonary arrest EXAM: CT ANGIOGRAPHY CHEST WITH CONTRAST TECHNIQUE: Multidetector CT imaging of the chest was performed using the standard protocol during bolus administration of intravenous contrast. Multiplanar CT image reconstructions and MIPs were obtained to evaluate the vascular anatomy. RADIATION DOSE REDUCTION: This exam was performed according to the departmental dose-optimization program which includes automated exposure control, adjustment of the mA and/or kV according to patient size and/or use of iterative reconstruction technique. CONTRAST:  75mL OMNIPAQUE IOHEXOL 350 MG/ML SOLN COMPARISON:  None Available. FINDINGS: Cardiovascular: Adequate opacification of the pulmonary arterial tree. No intraluminal filling defect identified to suggest acute pulmonary embolism. Central pulmonary arteries are of normal caliber. Moderate left anterior descending coronary artery calcification. Cardiac size within normal limits. No pericardial effusion. No significant atherosclerotic calcification within the thoracic aorta. No aortic aneurysm. Mediastinum/Nodes: Endotracheal tube and nasogastric tube are in appropriate position. The thyroid gland is atrophic or absent. No definite pathologic  adenopathy though imaging is limited by phase of contrast enhancement. The esophagus is unremarkable. Lungs/Pleura: There is extensive dependent pulmonary consolidation throughout the lungs which may relate to aspiration or multifocal infection. No pneumothorax or pleural effusion. Central airways are widely patent. Upper Abdomen: No acute abnormality. Musculoskeletal: No chest wall abnormality. No acute or significant osseous findings. Review of the MIP images confirms the above findings. IMPRESSION: 1. No pulmonary embolism. 2. Moderate left anterior descending coronary artery calcification. 3. Extensive dependent pulmonary consolidation throughout the lungs which may relate to aspiration or multifocal infection. Electronically Signed   By: Helyn Numbers M.D.   On: 08/26/2023 02:14   DG Chest Port 1 View Result Date: 08/26/2023 CLINICAL DATA:  Cardiac arrest, post CPR.  ET tube placement EXAM: PORTABLE CHEST 1 VIEW COMPARISON:  08/25/2023 FINDINGS: Endotracheal tube is 5 cm above the carina. OG tube enters the stomach. Heart and mediastinal contours are within normal limits. Perihilar airspace opacities may reflect edema. No effusions or pneumothorax. No acute bony abnormality. IMPRESSION: Support devices in expected position as above. Bilateral perihilar opacities may reflect edema. Electronically Signed   By: Charlett Nose M.D.   On: 08/26/2023 01:47   DG Abd Portable 1V Result Date: 08/26/2023 CLINICAL DATA:  OG tube placement EXAM: PORTABLE ABDOMEN - 1 VIEW COMPARISON:  None Available. FINDINGS: OG tube tip in the stomach.  Nonobstructive bowel gas pattern. IMPRESSION: OG tube in the stomach. Electronically Signed   By: Charlett Nose M.D.   On: 08/26/2023 01:46   DG Chest Port 1 View Result Date: 08/25/2023 CLINICAL DATA:  Shortness of breath EXAM: PORTABLE CHEST 1 VIEW COMPARISON:  March 02, 2022 FINDINGS: Evaluation is limited by positioning. The cardiomediastinal silhouette is unchanged in contour.  No pleural effusion. No pneumothorax. No acute pleuroparenchymal abnormality. IMPRESSION: No acute cardiopulmonary abnormality. Electronically Signed   By: Meda Klinefelter M.D.   On: 08/25/2023 14:10    EKG: (personally reviewed) 08/25/23 > NSR 99 bpm, low voltage QRS 08/26/23 > ST 120 bpm with PVC's, low voltage QRS, ? prolonged QT (artifact / post arrest / amiodarone) 08/26/23 > NSR 76 bpm, low voltage QRS,   TELEMETRY: SR 90-ST 120's, episodes of NSVT, VT early in admit on tele (personally reviewed)  DEVICE HISTORY: N/A   Studies:  ECHO 08/26/23 > LVEF  30-35%  R/LHC 08/26/23 > mild non-obs CAD, severely elevated pre-capillary filling pressures, hemodynamics consistent with sub-optimal RV function cMRI 08/28/23 >    Arrhythmia / AAD VT / VF 08/26/23 > s/p OOH arrest     Assessment/Plan:  Sudden Cardiac Death / OOH VT/VF Arrest  Questionable Transient Prolonged QTc   Non-obs CAD by LHC, LVEF 30-35% on ECHO, cMRI pending. No family hx of SCD.  No decrease in functional status prior to admit. Questionable prolonged QT on admit but was post arrest and amiodarone with significant artifact.  Subsequent EKG's with normal QT.  Medications reviewed for his relapsing/remitting MS & no association with VT  -await cMRI  -continue amiodarone  -avoid QT prolonging agents as able  -anticipate will need secondary prevention ICD before discharge -GDMT per Advanced HF Team, hopeful for EF recovery     For questions or updates, please contact [facility] Please consult [email or web address] for contact info under Cardiology/STEMI.  Signed, Canary Brim, NP-C, AGACNP-BC [facility] - Electrophysiology  08/28/2023, 10:28 AM  Out-of-hospital cardiac arrest polymorphic VT  Monomorphic VT 08/28/2023 2 minutes duration  Nonsustained ventricular tachycardia  Nonischemic cardiomyopathy  Multiple sclerosis on the [place]trada  Antecedent history of tachypalpitations and presyncope   The patient  presents with out-of-hospital cardiac arrest with 2 days of symptoms of tachypalpitations and presyncope.  Notably, he also has had at least 2-year history of similar events occurring about once a month This morning at oh dark 30 he had 2 and half minutes of monomorphic VT.  It is associated with her triggering being of the repeating morphology noted on telemetry strip just about 5 minutes before.  This suggest a reentrant arrhythmia.  He also has nonsustained ventricular tachycardia on a different morphology without triggering beat suggesting automatic arrhythmia  His ejection fraction is low.  Is not clear what is related to the cardiac arrest or antedates his event.  With his antecedent symptoms i.e. the ones that have persisted for couple of years prior now I would wonder and suspect that he does not have a cardiomyopathy.  To this end, cardiac MR and single average ECG can be helpful in elucidating structural heart disease.  Given the recurrence of nonsustained ventricular tachycardia particularly long nonsustained ventricular tachycardia I think amiodarone suppression of his ventricular arrhythmias in conjunction with an ICD will be appropriate.  Hopefully does not need to be amiodarone but would continue the amiodarone for now.  Other mechanisms or cardiac arrest should be excluded.  If his cMRI is normal, genetic testing would be appropriate as would flecainide challenge for Brugada syndrome.  With the sustained ventricular tachycardia I do not think exclusion of WPW is mandatory as it could certainly have degenerated into ventricular fibrillation.  Electrocardiograms are not consistent nor is the onset of his events that are recorded suggestive of long QT although the history is certainly concerning for that.  There is no family history as I can tell.  He will need to be weaned off of his milrinone.  He will need GDMT for his cardiomyopathy.  Prior to ICD he needs for secondary prevention patient  had a cMRI as noted.
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
## 2023-08-28 NOTE — Progress Notes (Signed)
 SLP Cancellation Note  Patient Details Name: NENG ALBEE MRN: [id number] DOB: 1970-07-12   Cancelled evaluation:  Pt passed Swallow Screen and is doing well with diet per RN. Per protocol, no formal SLP swallow eval warranted. Will sign off. Please feel free to re-consult if needed.  Sho Salguero L. Samson Frederic, MA CCC/SLP Clinical Specialist - Acute Care SLP Acute Rehabilitation Services Office number [phone]          Blenda Mounts Laurice 08/28/2023, 9:29 AM

## 2023-08-28 NOTE — Progress Notes (Signed)
 Advanced Heart Failure Rounding Note  Cardiologist: Dr. Gasper Lloyd (new)  Chief Complaint: Out of hospital VF Arrest   Patient Profile   Johnathan Ross is a 53 y.o. male with a hx of DM1, hypothyroidism, HTN, multiple sclerosis, hyperlipidemia admitted for OOH VT/VF arrest.   Pertinent Data:   Shocked 8 times, given epinephrine x2 and amiodarone. Intubated.   Initial K 3.3, Mg 2.0  Hs trop 290>>150 LA 5.5>>2.2 >>4.1 Post arrest EKG NSR w/ low voltage and short P-R interval. No STE. QTc ok 367 ms. UDS + Benzos  Echo EF 30-35%, no LVH. RV mildly reduced  R/LHC: Mild nonobstructive CAD, severely elevated pre capillary filling pressures. Hemodynamics (PAPi, CVP/PCWP ratio) consistent with suboptimal RV function >>started on Milrinone 0.25 mcg/kg/min Ost LAD to Prox LAD lesion is 30% stenosed.   HEMODYNAMICS:. RA:                  12 mmHg (mean) RV:                  35/10-14 mmHg PA:                  38/20 mmHg (26 mean) PCWP:            14 mmHg (mean)                                      Estimated Fick CO/CI: 4.3 L/min, 1.94 L/min/m2   Thermodilution CO/CI: 4.39 L/min, 1.98 L/min/m2                                           TPG                 12  mmHg                                            PVR                 2.8 Wood Units  PAPi                1.5     LVEDP: 18  EEG 3/31: moderate to severe diffuse encephalopathy. No seizures or epileptiform discharges   Subjective:    Extubated yesterday.  On 5L [place].  Had recurrent run of ? VT vs SVT ~1 am overnight. K was 4.1, Mg was 2.2. Milrinone was reduced down to 0.125.   Currently, he remains on amio gtt at 30/hr. Currently sinus tach 120s. On Milrinone 0.125.  CI 4.7. Co-ox pending.   No further VT/VF. Amio gtt @ 30/hr. K 4.1, Mg 1.8   Awake and alert. Has some short term memory loss. A&Ox 2 to person and place, not time. No other deficits.   [place]wan #s CVP 4-5 PAP 24/14 CO 10.4 TD 4.67 Co-ox  pending  Remains on  Rocephin.  WBCs down trending.    Objective:   Weight Range: (P) 101.1 kg Body mass index is 29.41 kg/m (pended).   Vital Signs:   Temp:  [97 F (36.1 C)-99.1 F (37.3 C)] 98.8 F (37.1 C) (04/02 0400) Pulse Rate:  [67-135] 106 (04/02 0400) Resp:  [  0-29] 15 (04/02 0400) BP: (79-186)/(51-92) 133/70 (04/02 0200) SpO2:  [94 %-100 %] 100 % (04/02 0400) Arterial Line BP: (85-148)/(50-115) 124/83 (04/02 0400) FiO2 (%):  [40 %-50 %] 40 % (04/01 1130) Weight:  [101.1 kg] (P) 101.1 kg (04/02 0500) Last BM Date :  (PTA)  Weight change: Filed Weights   08/26/23 1451 08/27/23 0500 08/28/23 0500  Weight: 98.2 kg 98.9 kg (P) 101.1 kg    Intake/Output:   Intake/Output Summary (Last 24 hours) at 08/28/2023 0813 Last data filed at 08/28/2023 0400 Gross per 24 hour  Intake 1704.17 ml  Output 1310 ml  Net 394.17 ml      Physical Exam    CVP 4-5  General:  Well appearing. No respiratory difficulty HEENT: normal + RIJ swan  Neck: supple. no JVD. Carotids 2+ bilat; no bruits. No lymphadenopathy or thyromegaly appreciated. Cor: PMI nondisplaced. Regular rate & rhythm. No rubs, gallops or murmurs. Lungs: clear Abdomen: soft, nontender, nondistended. No hepatosplenomegaly. No bruits or masses. Good bowel sounds. Extremities: no cyanosis, clubbing, rash, edema Neuro: alert & oriented x 2 person and place. Moving all 4 ext w/o difficulty.     Telemetry   Sinus tach 120s, run of NSVT vs SVT ~1 am, HR 170s, personally reviewed   EKG    No new EKG to review   Labs    CBC Recent Labs    08/27/23 0516 08/27/23 0520 08/28/23 0104  WBC 27.1*  --  23.9*  HGB 14.1 14.3 13.2  HCT 40.1 42.0 38.7*  MCV 82.5  --  84.3  PLT 235  --  179   Basic Metabolic Panel Recent Labs    16/10/96 0434 08/26/23 0439 08/27/23 0516 08/27/23 0520 08/28/23 0104  NA 134*   < > 135 135 134*  K 4.7   < > 4.1 4.0 4.1  CL 105  --  106  --  102  CO2 17*  --  19*  --  22  GLUCOSE 283*  --  186*  --   252*  BUN 11  --  16  --  26*  CREATININE 1.16  --  1.30*  --  1.33*  CALCIUM 7.6*  --  8.4*  --  8.2*  MG 2.0  --  1.8  --  2.2  PHOS 3.6  --   --   --   --    < > = values in this interval not displayed.   Liver Function Tests Recent Labs    08/26/23 0719 08/27/23 0516  AST 228* 95*  ALT 200* 133*  ALKPHOS 85 65  BILITOT 1.2 0.5  PROT 5.9* 5.4*  ALBUMIN 3.2* 2.9*   No results for input(s): "LIPASE", "AMYLASE" in the last 72 hours. Cardiac Enzymes Recent Labs    08/26/23 0719  CKTOTAL 1,253*    BNP: BNP (last 3 results) Recent Labs    08/26/23 0645  BNP 102.9*    ProBNP (last 3 results) No results for input(s): "PROBNP" in the last 8760 hours.   D-Dimer Recent Labs    08/25/23 1433  DDIMER 0.34   Hemoglobin A1C No results for input(s): "HGBA1C" in the last 72 hours. Fasting Lipid Panel Recent Labs    08/27/23 0515  TRIG 55   Thyroid Function Tests Recent Labs    08/26/23 0434  TSH 2.213    Other results:   Imaging    No results found.    Medications:     Scheduled Medications:  Chlorhexidine  Gluconate Cloth  6 each Topical Daily   dalfampridine  10 mg Oral BID   heparin injection (subcutaneous)  5,000 Units Subcutaneous Q8H   insulin glargine-yfgn  35 Units Subcutaneous BID   levothyroxine  125 mcg Oral Q0600   losartan  25 mg Oral Daily   mouth rinse  15 mL Mouth Rinse Q2H   polyethylene glycol  17 g Oral Daily   senna-docusate  2 tablet Oral QHS   sodium chloride flush  3-10 mL Intravenous Q12H    Infusions:  amiodarone 30 mg/hr (08/28/23 0541)   cefTRIAXone (ROCEPHIN)  IV Stopped (08/27/23 1209)   feeding supplement (VITAL 1.5 CAL) Stopped (08/27/23 1859)   insulin 0.8 Units/hr (08/28/23 0400)   milrinone 0.125 mcg/kg/min (08/28/23 0400)    PRN Medications: dextrose, docusate, fentaNYL, hydrALAZINE, ondansetron (ZOFRAN) IV, mouth rinse, polyethylene glycol, sodium chloride flush     Assessment/Plan   1. OOH  VT/VF Arrest - ~ 20 min bystander CPR. ROSC after multiple defibs, Epi + amio  - no STE on post arrest EKG. Noted short PR interval, low voltage. QTc 367 ms - initial K 3.0, Mg 2.0>>given K supp  - Hs trop 290>>150 - Echo EF 30-35%, no LVH. RV mildly reduced  - R/LHC nonobs CAD - had recurrent NSVT overnight. K/Mg WNL - Continue amio gtt - Keep K > 4.0, Mg > 2.0  - will need cMRI and ICD prior to d/c. EP has been consulted     2. 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 RV mildly reduced. No prior study for comparison  - uncertain if baseline CM that triggered VT/VF vs cardiac stunning post arrest  - R/LHC nonobs CAD, suboptimal RV fx w/ low output (RA 12, PAP 38/20, PCW 14 CI 1.9) - now on Milrinone 0.125. CI ok. CVP 4-5  - start PO GDMT, add losartan 25 mg daily  - will stop Milrinone once MAPs improve  - anticipate MRA tomorrow and SGLT2i prior to d/c  - hold loop diuretics for now - Plan cMRI today  - will need ICD prior to d/c     3. Vasoplegic/ Septic Shock - shock resolved. BP stable off pressors  - initial PCT 21 - ? Aspiration. On rocephin per CCM  - Bcx NGTD   4. Acute Hypoxic Respiratory Failure - intubated post arrest, now extubated>>5L [place]  - abx for suspected aspiration PNA      Length of Stay: 2  Robbie Lis, PA-C  08/28/2023, 8:13 AM  Advanced Heart Failure Team Pager [phone] (M-F; 7a - 5p)  Please contact [facility] for night-coverage after hours (5p -7a ) and weekends on [email or web address]

## 2023-08-28 NOTE — Progress Notes (Signed)
 NAME:  Johnathan Ross, MRN:  [id number], DOB:  05/26/1971, LOS: 2 ADMISSION DATE:  08/26/2023, CONSULTATION DATE: 08/28/23 REFERRING MD:  Pilar Plate, CHIEF COMPLAINT:  cardiac arrest   History of Present Illness:  Johnathan Ross is a 53 y.o. M with PMH of relapsing remitting MS on Lem[place], HL, HTN, hypothyroidism, type 1 DM who presented to the ED after apparently collapsing and requiring bystander CPR before EMS arrived and he was found to be in Vfib.  Arrest witnessed by his girlfriend. Pt was shocked eight times, given Epi x2 and Amiodarone. CPR lasted approximately 20 minutes.  He was intubated and brought to the ED where he was in sinus rhythm with prolonged Qtc.  He required peripheral norepi.  A CT head and CTA chest did not reveal any intracranial abnormalities and no PE though had extensive consolidation throughout the lungs, likely aspiration.  PCCM consulted for admission. 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   Significant Hospital Events: Including procedures, antibiotic start and stop dates in addition to other pertinent events   3/31 Vfib arrest, approx 20 mins ACLS, ETT and ICU admit 4/1 extubated  Interim History / Subjective:  Wide complex tachycardia overnight self-resolved, pads were placed On amio Back in sinus Confused but otherwise doing well  Objective   Blood pressure 133/70, pulse (!) 106, temperature 98.8 F (37.1 C), resp. rate 15, weight (P) 101.1 kg, SpO2 100%. PAP: (24-33)/(17-25) 24/17 CVP:  [5 mmHg-15 mmHg] 6 mmHg CO:  [9.4 L/min] 9.4 L/min CI:  [4.21  L/min/m2] 4.21 L/min/m2  Vent Mode: CPAP;PSV FiO2 (%):  [40 %-50 %] 40 % PEEP:  [5 cmH20] 5 cmH20 Pressure Support:  [8 cmH20] 8 cmH20   Intake/Output Summary (Last 24 hours) at 08/28/2023 0903 Last data filed at 08/28/2023 0400 Gross per 24 hour  Intake 1536.33 ml  Output 1280 ml  Net 256.33 ml   Filed Weights   08/26/23 1451 08/27/23 0500 08/28/23 0500  Weight: 98.2 kg 98.9 kg (P) 101.1 kg    No distress On 4LPM low sats Moves to command Aox3 but paranoid and short term memory loss Ext chronic changes Moves upper ext well  WBC better H/H, plts okay Cr stable No new chest imaging   amiodarone 30 mg/hr (08/28/23 0541)   cefTRIAXone (ROCEPHIN)  IV Stopped (08/27/23 1209)   feeding supplement (VITAL 1.5 CAL) Stopped (08/27/23 1859)   insulin 0.8 Units/hr (08/28/23 0400)   milrinone 0.125 mcg/kg/min (08/28/23 0400)     Assessment & Plan:    Witnessed out of hospital Vfib arrest x 20 min intermittent- trops flat, EF down, query primary arrythmia and new cardiomyopathy Post arrest shock- mix cardiogenic and septic (aspiration), improved Post arrest AKI on CKD- stable/improved Post arrest ALI- improved Post arrest encephalopathy- some lingering delirium but overall improved Post arrest resp failure with probable aspiration- improved, some lingering hypoxemia; doing well with IS History of HTN, HLD Type 1 DM- on insulin gtt, to switch to basal bolus today Multiple Sclerosis -on Lemtrada Hypothyroidism- okay TSH, continue synthroid  - Ceftriaxone x 5 days - Cardiac MRI today - Amio gtt - Diuresis and GDMT per AHF team - Re-orient, encourage  day/night cycles - PT/OT  Best Practice (right click and "Reselect all SmartList Selections" daily)   Diet/type: TF DVT prophylaxis prophylactic heparin  Pressure ulcer(s): N/A GI prophylaxis: H2B Lines: N/A Foley:  Yes, and it is still needed Code Status:  full code Last date of multidisciplinary goals of care discussion  [family updated at bedside]  37 min cc time Myrla Halsted MD PCCM

## 2023-08-28 NOTE — Progress Notes (Signed)
 RT x 2 transported ventilator and nitric machine to Cath lab and back to 2 H22. No complication noted with transport.

## 2023-08-28 NOTE — Progress Notes (Incomplete)
 Nutrition Follow-up  DOCUMENTATION CODES:   Not applicable  INTERVENTION:  ***   NUTRITION DIAGNOSIS:   Inadequate oral intake related to inability to eat as evidenced by NPO status.  ***  GOAL:   Patient will meet greater than or equal to 90% of their needs  ***  MONITOR:   TF tolerance  REASON FOR ASSESSMENT:   Ventilator, Consult Enteral/tube feeding initiation and management  ASSESSMENT:   53 yo male admitted S/P out of hospital V fib cardiac arrest. PMH includes HLD, HTN, hypothyroidism, proliferative diabetic retinopathy, multiple sclerosis, DM-1, vitamin D insufficiency.  3/31 Admitted, Vfib arrest, Intubated, TF initiated 4/01 Extubated, CL diet 4/02 Diet advanced  ***  Alert but confused  Labs: Meds:   NUTRITION - FOCUSED PHYSICAL EXAM:  {RD Focused Exam List:21252}  Diet Order:   Diet Order             Diet Carb Modified Fluid consistency: Thin; Room service appropriate? Yes; Fluid restriction: 1800 mL Fluid  Diet effective now                   EDUCATION NEEDS:   No education needs have been identified at this time  Skin:  Skin Assessment: Reviewed RN Assessment  Last BM:  no BM documented  Height:   Ht Readings from Last 1 Encounters:  08/25/23 6\' 1"  (1.854 m)    Weight:   Wt Readings from Last 1 Encounters:  08/28/23 101.1 kg    Ideal Body Weight:  83.6 kg  BMI:  Body mass index is 29.41 kg/m.  Estimated Nutritional Needs:   Kcal:  2200-2400  Protein:  140-160 gm  Fluid:  2.2-2.4 L    ***

## 2023-08-29 ENCOUNTER — Encounter (HOSPITAL_COMMUNITY): Payer: Self-pay

## 2023-08-29 ENCOUNTER — Inpatient Hospital Stay (HOSPITAL_COMMUNITY)

## 2023-08-29 ENCOUNTER — Other Ambulatory Visit (HOSPITAL_COMMUNITY): Payer: Self-pay

## 2023-08-29 DIAGNOSIS — I469 Cardiac arrest, cause unspecified: Secondary | ICD-10-CM | POA: Diagnosis not present

## 2023-08-29 DIAGNOSIS — E119 Type 2 diabetes mellitus without complications: Secondary | ICD-10-CM | POA: Diagnosis not present

## 2023-08-29 DIAGNOSIS — I5021 Acute systolic (congestive) heart failure: Secondary | ICD-10-CM | POA: Diagnosis not present

## 2023-08-29 DIAGNOSIS — J9601 Acute respiratory failure with hypoxia: Secondary | ICD-10-CM | POA: Diagnosis not present

## 2023-08-29 DIAGNOSIS — I5082 Biventricular heart failure: Secondary | ICD-10-CM | POA: Diagnosis not present

## 2023-08-29 LAB — BASIC METABOLIC PANEL WITH GFR
Anion gap: 11 (ref 5–15)
BUN: 18 mg/dL (ref 6–20)
CO2: 26 mmol/L (ref 22–32)
Calcium: 8.2 mg/dL — ABNORMAL LOW (ref 8.9–10.3)
Chloride: 97 mmol/L — ABNORMAL LOW (ref 98–111)
Creatinine, Ser: 0.93 mg/dL (ref 0.61–1.24)
GFR, Estimated: >60 mL/min (ref >60)
Glucose, Bld: 143 mg/dL — ABNORMAL HIGH (ref 70–99)
Potassium: 4.3 mmol/L (ref 3.5–5.1)
Sodium: 134 mmol/L — ABNORMAL LOW (ref 135–145)

## 2023-08-29 LAB — CBC
HCT: 39 % (ref 39.0–52.0)
Hemoglobin: 13.1 g/dL (ref 13.0–17.0)
MCH: 28.8 pg (ref 26.0–34.0)
MCHC: 33.6 g/dL (ref 30.0–36.0)
MCV: 85.7 fL (ref 80.0–100.0)
Platelets: 180 10*3/uL (ref 150–400)
RBC: 4.55 MIL/uL (ref 4.22–5.81)
RDW: 14 % (ref 11.5–15.5)
WBC: 18.4 10*3/uL — ABNORMAL HIGH (ref 4.0–10.5)
nRBC: 0 % (ref 0.0–0.2)

## 2023-08-29 LAB — MAGNESIUM: Magnesium: 1.9 mg/dL (ref 1.7–2.4)

## 2023-08-29 LAB — GLUCOSE, CAPILLARY
Glucose-Capillary: 134 mg/dL — ABNORMAL HIGH (ref 70–99)
Glucose-Capillary: 150 mg/dL — ABNORMAL HIGH (ref 70–99)
Glucose-Capillary: 223 mg/dL — ABNORMAL HIGH (ref 70–99)
Glucose-Capillary: 224 mg/dL — ABNORMAL HIGH (ref 70–99)
Glucose-Capillary: 229 mg/dL — ABNORMAL HIGH (ref 70–99)
Glucose-Capillary: 89 mg/dL (ref 70–99)

## 2023-08-29 LAB — COOXEMETRY PANEL
Carboxyhemoglobin: 0.9 % (ref 0.5–1.5)
Methemoglobin: <0.7 % (ref 0.0–1.5)
O2 Saturation: 61.6 %
Total hemoglobin: 12.8 g/dL (ref 12.0–16.0)

## 2023-08-29 MED ORDER — GADOBUTROL 1 MMOL/ML IV SOLN
10.0000 mL | Freq: Once | INTRAVENOUS | Status: AC | PRN
  Administered 2023-08-29: 10 mL via INTRAVENOUS

## 2023-08-29 MED ORDER — SACUBITRIL-VALSARTAN 24-26 MG PO TABS
1.0000 | ORAL_TABLET | Freq: Two times a day (BID) | ORAL | Status: AC
  Administered 2023-08-29: 1 via ORAL
  Filled 2023-08-29: qty 1

## 2023-08-29 MED ORDER — GADOBUTROL 1 MMOL/ML IV SOLN: 10.0000 mL | Freq: Once | INTRAVENOUS | Status: DC | PRN

## 2023-08-29 MED ORDER — SACUBITRIL-VALSARTAN 49-51 MG PO TABS
1.0000 | ORAL_TABLET | Freq: Two times a day (BID) | ORAL | Status: DC
  Administered 2023-08-29 – 2023-09-03 (×9): 1 via ORAL
  Filled 2023-08-29 (×11): qty 1

## 2023-08-29 MED ORDER — ROPINIROLE HCL 0.5 MG PO TABS
0.2500 mg | ORAL_TABLET | Freq: Every day | ORAL | Status: DC
  Administered 2023-08-29 – 2023-09-02 (×5): 0.25 mg via ORAL
  Filled 2023-08-29 (×6): qty 1

## 2023-08-29 MED ORDER — SPIRONOLACTONE 12.5 MG HALF TABLET
12.5000 mg | ORAL_TABLET | Freq: Every day | ORAL | Status: DC
  Administered 2023-08-29: 12.5 mg via ORAL
  Filled 2023-08-29 (×2): qty 1

## 2023-08-29 MED ORDER — MELATONIN 5 MG PO TABS
5.0000 mg | ORAL_TABLET | Freq: Every evening | ORAL | Status: DC | PRN
  Administered 2023-08-29 – 2023-08-31 (×3): 5 mg via ORAL
  Filled 2023-08-29 (×3): qty 1

## 2023-08-29 MED ORDER — SACUBITRIL-VALSARTAN 24-26 MG PO TABS
1.0000 | ORAL_TABLET | Freq: Two times a day (BID) | ORAL | Status: DC
  Administered 2023-08-29: 1 via ORAL
  Filled 2023-08-29 (×2): qty 1

## 2023-08-29 MED ORDER — MAGNESIUM SULFATE 2 GM/50ML IV SOLN
2.0000 g | Freq: Once | INTRAVENOUS | Status: AC
Start: 2023-08-29 — End: 2023-08-29
  Administered 2023-08-29: 2 g via INTRAVENOUS
  Filled 2023-08-29: qty 50

## 2023-08-29 NOTE — TOC Benefit Eligibility Note (Signed)
 Pharmacy Patient Advocate Encounter  Insurance verification completed.    The patient is insured through [employer]. Patient has Medicare and is not eligible for a copay card, but may be able to apply for patient assistance or Medicare RX Payment Plan (Patient Must reach out to their plan, if eligible for payment plan), if available.    Ran test claim for Entresto and the current 30 day co-pay is $12.15.   This test claim was processed through [facility]- copay amounts may vary at other pharmacies due to pharmacy/plan contracts, or as the patient moves through the different stages of their insurance plan.

## 2023-08-29 NOTE — Progress Notes (Signed)
 Received consult to assess current PIV and possibly place a 2nd PIV. Pt currently has a central line documented. Per primary RN, the primary team wants central line removed before procedure tomorrow. Recommended to keep central line at this time due to order for continuous IV amio. Spoke with IV team RN Renee & Kenney Houseman who agreed.

## 2023-08-29 NOTE — Progress Notes (Signed)
 Received order to discontinue CVC. This RN consulted IV team to ensure that patient has appropriate PIV access prior to removal of CVC, since IV amiodarone is currently infusing. Per Kara Mead, IV team RN, IV amiodarone should remain infusing through CVC due to it being a high irritant drug. Therefore, this RN did not remove CVC. Please see note from Doreene Nest, IV team RN.

## 2023-08-29 NOTE — Progress Notes (Signed)
 NAME:  Johnathan Ross, MRN:  [id number], DOB:  12/22/1970, LOS: 3 ADMISSION DATE:  08/26/2023, CONSULTATION DATE: 08/29/23 REFERRING MD:  Pilar Plate, CHIEF COMPLAINT:  cardiac arrest   History of Present Illness:  Johnathan Ross is a 53 y.o. M with PMH of relapsing remitting MS on Lem[place], HL, HTN, hypothyroidism, type 1 DM who presented to the ED after apparently collapsing and requiring bystander CPR before EMS arrived and he was found to be in Vfib.  Arrest witnessed by his girlfriend. Pt was shocked eight times, given Epi x2 and Amiodarone. CPR lasted approximately 20 minutes.  He was intubated and brought to the ED where he was in sinus rhythm with prolonged Qtc.  He required peripheral norepi.  A CT head and CTA chest did not reveal any intracranial abnormalities and no PE though had extensive consolidation throughout the lungs, likely aspiration.  PCCM consulted for admission. 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   Significant Hospital Events: Including procedures, antibiotic start and stop dates in addition to other pertinent events   3/31 Vfib arrest, approx 20 mins ACLS, ETT and ICU admit 4/1 extubated  Interim History / Subjective:  No events. Still having some intermittent paranoid delirium. Also RLS type symptoms keeping him awake  Objective   Blood pressure 129/75, pulse 93, temperature 98.7 F (37.1 C), temperature source Oral, resp. rate 15, weight 102.2 kg, SpO2 98%. PAP: (22-30)/(9-18) 23/12 CVP:  [6 mmHg-24 mmHg] 14 mmHg      Intake/Output Summary (Last 24 hours) at  08/29/2023 0925 Last data filed at 08/29/2023 0900 Gross per 24 hour  Intake 1307.13 ml  Output 2225 ml  Net -917.87 ml   Filed Weights   08/27/23 0500 08/28/23 0500 08/29/23 0500  Weight: 98.9 kg 101.1 kg 102.2 kg   No distress Moves to command Ext warm Aox3 but does not remember conversations from yesterday Lungs sound good Moves uppers okay  WBC better  Assessment & Plan:    Witnessed out of hospital Vfib arrest x 20 min intermittent- trops flat, EF down, query primary arrythmia and new cardiomyopathy Post arrest shock- mix cardiogenic and septic (aspiration), improved Post arrest AKI on CKD- stable/improved Post arrest ALI- improved Post arrest encephalopathy- some lingering delirium but overall improved Post arrest resp failure with probable aspiration- improved, some lingering hypoxemia; doing well with IS History of HTN, HLD Type 1 DM- on insulin gtt, to switch to basal bolus today Multiple Sclerosis -on Lemtrada Hypothyroidism- okay TSH, continue synthroid RLS type symptoms- related to MS, some breakthrough, will try requip  - Ceftriaxone x 5 days - Cardiac MRI today with haldol and valium - Amio/milrinone gtt per AHF - Diuresis and GDMT per AHF team - Re-orient, encourage day/night cycles - PT/OT - EP is following as well to see if needs device  Best Practice (right click and "Reselect all SmartList Selections" daily)   Diet/type: regular DVT prophylaxis prophylactic heparin  Pressure ulcer(s): N/A GI prophylaxis: H2B Lines: N/A Foley:  Yes, and it is still needed Code Status:  full code Last date of multidisciplinary goals of care discussion [family updated at bedside]  ?out of ICU?  Myrla Halsted MD  PCCM

## 2023-08-29 NOTE — Progress Notes (Signed)
 Physical Therapy Treatment Patient Details Name: Johnathan Ross MRN: [id number] DOB: 10-25-1970 Today's Date: 08/29/2023   History of Present Illness 53 yo male admitted 3/31 after collapse and CPR of grossly 20 min with Vfib shock x 8. Intubated 3/31-4/1. 3/31 RHC with Rt heart failure. PMhx: HTN, DM, diabetic retinopathy, multiple sclerosis, Rt tib/fib fx    PT Comments  Patient highly motivated and eager to participate and get OOB. Patient limited by bil LE and trunk weakness with tendency to use his upper body strength to compensate for these weaker areas. Cues to utilize trunk muscles instead of UE support. Able to progress to standing with RW from elevated surface with +2 mod assist. Able to stand from even higher surface (stedy seat) with min assist. Uncontrolled eccentric descent into sitting noted with lower surfaces. Patient will benefit from inpatient therapy >3 hrs/day.     If plan is discharge home, recommend the following: A lot of help with walking and/or transfers;A lot of help with bathing/dressing/bathroom;Assist for transportation;Assistance with cooking/housework;Supervision due to cognitive status;Help with stairs or ramp for entrance   Can travel by private vehicle        Equipment Recommendations  Other (comment) (TBD with progression)    Recommendations for Other Services       Precautions / Restrictions Precautions Precautions: Fall;Other (comment) Recall of Precautions/Restrictions: Impaired Precaution/Restrictions Comments: MS with LB weakness Restrictions Weight Bearing Restrictions Per Provider Order: No     Mobility  Bed Mobility Overal bed mobility: Needs Assistance Bed Mobility: Supine to Sit           General bed mobility comments: supine to sit via bed features to full upright sitting with feet on the floor    Transfers Overall transfer level: Needs assistance Equipment used: Rolling walker (2 wheels) (vs stedy) Transfers: Sit to/from  Stand, Bed to chair/wheelchair/BSC Sit to Stand: Mod assist, Min assist, From elevated surface           General transfer comment: stood from egress position with RW +2 mod assist with use of pad under pelvis and gait belt; x 2 reps; stood with stedy x 3 reps (min) Transfer via Lift Equipment: Stedy  Ambulation/Gait               General Gait Details: not ready due to bil LE weakness; girlfriend to bring in AFOs   Stairs             Wheelchair Mobility     Tilt Bed    Modified Rankin (Stroke Patients Only)       Balance Overall balance assessment: Needs assistance Sitting-balance support: No upper extremity supported, Feet supported Sitting balance-Leahy Scale: Poor Sitting balance - Comments: has narrow range where he can balance without UE support; if shoulders get posterior to hips, must use UEs to catch himself Postural control: Posterior lean Standing balance support: Bilateral upper extremity supported, During functional activity, Reliant on assistive device for balance Standing balance-Leahy Scale: Poor                              Communication Communication Communication: No apparent difficulties  Cognition Arousal: Alert Behavior During Therapy: WFL for tasks assessed/performed                             Following commands: Intact      Cueing Cueing Techniques: Verbal cues, Visual cues  Exercises General Exercises - Lower Extremity Long Arc Quad: AROM, Both, Other reps (comment) (3) Mini-Sqauts: AROM, 10 reps (in stedy) Other Exercises Other Exercises: bil heel cord stretches; PROM    General Comments General comments (skin integrity, edema, etc.): Girlfriend present throughout. Very supportive.      Pertinent Vitals/Pain Pain Assessment Pain Assessment: Faces Faces Pain Scale: Hurts little more Pain Location: chest Pain Descriptors / Indicators: Sore Pain Intervention(s): Limited activity within patient's  tolerance, Monitored during session    Home Living                          Prior Function            PT Goals (current goals can now be found in the care plan section) Acute Rehab PT Goals Patient Stated Goal: return home, draw Time For Goal Achievement: 09/10/23 Potential to Achieve Goals: Good Progress towards PT goals: Progressing toward goals    Frequency    Min 3X/week      PT Plan      Co-evaluation PT/OT/SLP Co-Evaluation/Treatment: Yes Reason for Co-Treatment: Complexity of the patient's impairments (multi-system involvement);For patient/therapist safety;To address functional/ADL transfers PT goals addressed during session: Mobility/safety with mobility;Balance;Proper use of DME;Strengthening/ROM        AM-PAC PT "6 Clicks" Mobility   Outcome Measure  Help needed turning from your back to your side while in a flat bed without using bedrails?: A Lot Help needed moving from lying on your back to sitting on the side of a flat bed without using bedrails?: Total Help needed moving to and from a bed to a chair (including a wheelchair)?: Total Help needed standing up from a chair using your arms (e.g., wheelchair or bedside chair)?: Total Help needed to walk in hospital room?: Total Help needed climbing 3-5 steps with a railing? : Total 6 Click Score: 7    End of Session Equipment Utilized During Treatment: Gait belt Activity Tolerance: Patient tolerated treatment well Patient left: with call bell/phone within reach;with family/visitor present;in chair;with chair alarm set Nurse Communication: Mobility status;Need for lift equipment PT Visit Diagnosis: Other abnormalities of gait and mobility (R26.89);Difficulty in walking, not elsewhere classified (R26.2);Muscle weakness (generalized) (M62.81)     Time: 1610-9604 PT Time Calculation (min) (ACUTE ONLY): 39 min  Charges:    $Gait Training: 8-22 mins $Therapeutic Exercise: 8-22 mins PT General  Charges $$ ACUTE PT VISIT: 1 Visit                      Johnathan Ross, PT Acute Rehabilitation Services  Office [phone]    Johnathan Ross 08/29/2023, 10:04 AM

## 2023-08-29 NOTE — Progress Notes (Signed)
  Inpatient Rehabilitation Admissions Coordinator   I will place rehab consult for full assessment of CIR candidacy. Noted progress with therapy.  Ottie Glazier, RN, MSN Rehab Admissions Coordinator [phone] 08/29/2023 12:22 PM

## 2023-08-29 NOTE — Progress Notes (Signed)
 Advanced Heart Failure Rounding Note  Cardiologist: Dr. Gasper Lloyd (new)  Chief Complaint: Out of hospital VF Arrest   Patient Profile   Johnathan Ross is a 53 y.o. male with a hx of DM1, hypothyroidism, HTN, multiple sclerosis, hyperlipidemia admitted for OOH VT/VF arrest.   Pertinent Data:   Shocked 8 times, given epinephrine x2 and amiodarone. Intubated.   Initial K 3.3, Mg 2.0  Hs trop 290>>150 LA 5.5>>2.2 >>4.1 Post arrest EKG NSR w/ low voltage and short P-R interval. No STE. QTc ok 367 ms. UDS + Benzos  Echo EF 30-35%, no LVH. RV mildly reduced  R/LHC: Mild nonobstructive CAD, severely elevated pre capillary filling pressures. Hemodynamics (PAPi, CVP/PCWP ratio) consistent with suboptimal RV function >>started on Milrinone 0.25 mcg/kg/min Ost LAD to Prox LAD lesion is 30% stenosed.   HEMODYNAMICS:. RA:                  12 mmHg (mean) RV:                  35/10-14 mmHg PA:                  38/20 mmHg (26 mean) PCWP:            14 mmHg (mean)                                      Estimated Fick CO/CI: 4.3 L/min, 1.94 L/min/m2   Thermodilution CO/CI: 4.39 L/min, 1.98 L/min/m2                                           TPG                 12  mmHg                                            PVR                 2.8 Wood Units  PAPi                1.5     LVEDP: 18  EEG 3/31: moderate to severe diffuse encephalopathy. No seizures or epileptiform discharges   Subjective:    CVP 7 Co-ox 62% on Milrinone 0.125.  Had brief NSVT overnight, 7 beats. Continues on amio gtt. K 4.3, Mg 1.9  Feels well today. Denies dyspnea and chest pain.    Objective:   Weight Range: 102.2 kg Body mass index is 29.73 kg/m.   Vital Signs:   Temp:  [98.5 F (36.9 C)-99.9 F (37.7 C)] 98.7 F (37.1 C) (04/03 0700) Pulse Rate:  [88-128] 93 (04/03 0800) Resp:  [0-28] 15 (04/03 0800) BP: (110-166)/(50-96) 129/75 (04/03 0800) SpO2:  [92 %-100 %] 98 % (04/03 0800) Arterial Line BP:  (92-123)/(73-90) 123/85 (04/02 1100) Weight:  [102.2 kg] 102.2 kg (04/03 0500) Last BM Date :  (PTA)  Weight change: Filed Weights   08/27/23 0500 08/28/23 0500 08/29/23 0500  Weight: 98.9 kg 101.1 kg 102.2 kg    Intake/Output:   Intake/Output Summary (Last 24 hours) at 08/29/2023 0837 Last data filed at 08/29/2023 0800  Gross per 24 hour  Intake 1314.92 ml  Output 2105 ml  Net -790.08 ml      Physical Exam    CVP 7  General:  Well appearing. No respiratory difficulty HEENT: normal Neck: supple. no JVD. Carotids 2+ bilat; no bruits. No lymphadenopathy or thyromegaly appreciated. Cor: PMI nondisplaced. Regular rate & rhythm. No rubs, gallops or murmurs. Lungs: clear Abdomen: soft, nontender, nondistended. No hepatosplenomegaly. No bruits or masses. Good bowel sounds. Extremities: no cyanosis, clubbing, rash, edema Neuro: alert & oriented x 3, cranial nerves grossly intact. moves all 4 extremities w/o difficulty. Affect pleasant.   Telemetry   NSR 90s, 1 7 beat run of NSVT overnight,  personally reviewed   EKG    No new EKG to review   Labs    CBC Recent Labs    08/28/23 0104 08/29/23 0544  WBC 23.9* 18.4*  HGB 13.2 13.1  HCT 38.7* 39.0  MCV 84.3 85.7  PLT 179 180   Basic Metabolic Panel Recent Labs    28/41/32 0104 08/29/23 0544  NA 134* 134*  K 4.1 4.3  CL 102 97*  CO2 22 26  GLUCOSE 252* 143*  BUN 26* 18  CREATININE 1.33* 0.93  CALCIUM 8.2* 8.2*  MG 2.2 1.9   Liver Function Tests Recent Labs    08/27/23 0516  AST 95*  ALT 133*  ALKPHOS 65  BILITOT 0.5  PROT 5.4*  ALBUMIN 2.9*   No results for input(s): "LIPASE", "AMYLASE" in the last 72 hours. Cardiac Enzymes No results for input(s): "CKTOTAL", "CKMB", "CKMBINDEX", "TROPONINI" in the last 72 hours.   BNP: BNP (last 3 results) Recent Labs    08/26/23 0645  BNP 102.9*    ProBNP (last 3 results) No results for input(s): "PROBNP" in the last 8760 hours.   D-Dimer No results  for input(s): "DDIMER" in the last 72 hours.  Hemoglobin A1C No results for input(s): "HGBA1C" in the last 72 hours. Fasting Lipid Panel Recent Labs    08/27/23 0515  TRIG 55   Thyroid Function Tests No results for input(s): "TSH", "T4TOTAL", "T3FREE", "THYROIDAB" in the last 72 hours.  Invalid input(s): "FREET3"   Other results:   Imaging    No results found.    Medications:     Scheduled Medications:  Chlorhexidine Gluconate Cloth  6 each Topical Daily   dalfampridine  10 mg Oral BID   diazepam  5 mg Intravenous Once   haloperidol lactate  5 mg Intravenous Once   heparin injection (subcutaneous)  5,000 Units Subcutaneous Q8H   insulin aspart  0-15 Units Subcutaneous Q4H   insulin glargine-yfgn  35 Units Subcutaneous BID   levothyroxine  125 mcg Oral Q0600   losartan  25 mg Oral Daily   polyethylene glycol  17 g Oral BID   rOPINIRole  0.25 mg Oral QHS   senna-docusate  2 tablet Oral BID   sodium chloride flush  3-10 mL Intravenous Q12H    Infusions:  amiodarone 30 mg/hr (08/29/23 0800)   cefTRIAXone (ROCEPHIN)  IV Stopped (08/28/23 1244)   insulin Stopped (08/28/23 1111)   milrinone 0.125 mcg/kg/min (08/29/23 0800)    PRN Medications: dextrose, diazepam, fentaNYL, hydrALAZINE, ondansetron (ZOFRAN) IV, mouth rinse, polyethylene glycol, sodium chloride flush     Assessment/Plan   1. OOH VT/VF Arrest - ~ 20 min bystander CPR. ROSC after multiple defibs, Epi + amio  - no STE on post arrest EKG. Noted short PR interval, low voltage. QTc 367 ms -  initial K 3.0, Mg 2.0>>given K supp  - Hs trop 290>>150 - Echo EF 30-35%, no LVH. RV mildly reduced  - R/LHC nonobs CAD - brief NSVT overnight, 9 beats  - Continue amio gtt - Keep K > 4.0, Mg > 2.0  - will need cMRI and ICD prior to d/c. EP has been consulted     2. 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 RV mildly reduced. No prior study for  comparison  - uncertain if baseline CM that triggered VT/VF vs cardiac stunning post arrest  - R/LHC nonobs CAD, suboptimal RV fx w/ low output (RA 12, PAP 38/20, PCW 14 CI 1.9) - now on Milrinone 0.125. C-ox 62%. CVP 7   - stop Losartan - start Entresto 24-26 mg bid - add spironolactone 12.5 mg daily  - stop Milrinone today if BPs improved  - hold loop diuretics for now - Plan cMRI today  - will need ICD prior to d/c   - plan SGLT2i prior to d/c    3. Vasoplegic/ Septic Shock - shock resolved. BP stable off pressors  - initial PCT 21, WBCs downtrending  - ? Aspiration. On rocephin per CCM  - Bcx NGTD   4. Acute Hypoxic Respiratory Failure - intubated post arrest, now extubated>>5L [place]  - abx for suspected aspiration PNA      Length of Stay: 3  Shailynn Fong, PA-C  08/29/2023, 8:37 AM  Advanced Heart Failure Team Pager [phone] (M-F; 7a - 5p)  Please contact [facility] for night-coverage after hours (5p -7a ) and weekends on [email or web address]

## 2023-08-29 NOTE — Progress Notes (Signed)
 Occupational Therapy Treatment Patient Details Name: Johnathan Ross MRN: [id number] DOB: 18-Feb-1971 Today's Date: 08/29/2023   History of present illness 53 yo male admitted 3/31 after collapse and CPR of grossly 20 min with Vfib shock x 8. Intubated 3/31-4/1. 3/31 RHC with Rt heart failure. PMhx: HTN, DM, diabetic retinopathy, multiple sclerosis, Rt tib/fib fx   OT comments  Using features of bed, pt able to pull trunk away from mattress in chair position with mod assist and demonstrate fair static sitting balance, unable to withstand challenge. Stood with +2 mod assist from bed and +2 min from platforms of [place]. Transferred OOB to chair with Stedy. Decreased control of descent to chair. HR to 125 with activity on 4L O2. Pt is highly motivated to regain strength and independence. Patient will benefit from intensive inpatient follow-up therapy, >3 hours/day.      If plan is discharge home, recommend the following:  Two people to help with walking and/or transfers;Two people to help with bathing/dressing/bathroom;Assistance with cooking/housework;Direct supervision/assist for medications management;Direct supervision/assist for financial management;Assist for transportation;Help with stairs or ramp for entrance   Equipment Recommendations  Other (comment) (defer to next venue)    Recommendations for Other Services Rehab consult    Precautions / Restrictions Precautions Precautions: Fall;Other (comment) Precaution/Restrictions Comments: MS with LB weakness Restrictions Weight Bearing Restrictions Per Provider Order: No Other Position/Activity Restrictions: girlfriend to bring pt's AFOs       Mobility Bed Mobility Overal bed mobility: Needs Assistance Bed Mobility: Supine to Sit     Supine to sit: Mod assist     General bed mobility comments: bed in chair position, able to pull trunk forward away from mattress with mod assist    Transfers Overall transfer level: Needs  assistance Equipment used: Rolling walker (2 wheels), Ambulation equipment used Transfers: Sit to/from Stand, Bed to chair/wheelchair/BSC Sit to Stand: Mod assist, Min assist, From elevated surface, +2 physical assistance           General transfer comment: stood from egress position with RW +2 mod assist with use of pad under pelvis and gait belt; x 2 reps; stood with stedy x 3 reps (min) Transfer via Lift Equipment: Stedy   Balance Overall balance assessment: Needs assistance Sitting-balance support: No upper extremity supported, Feet supported Sitting balance-Leahy Scale: Poor Sitting balance - Comments: posterior pelvic tilt with flexed spine, unable to withstand challenge Postural control: Posterior lean Standing balance support: Bilateral upper extremity supported, During functional activity, Reliant on assistive device for balance Standing balance-Leahy Scale: Poor                             ADL either performed or assessed with clinical judgement   ADL                   Upper Body Dressing : Maximal assistance;Sitting Upper Body Dressing Details (indicate cue type and reason): front opening gown                        Extremity/Trunk Assessment              Vision       Perception     Praxis     Communication Communication Communication: No apparent difficulties   Cognition Arousal: Alert Behavior During Therapy: Impulsive Cognition: Cognition impaired       Memory impairment (select all impairments): Declarative long-term memory, Working [employer] fast streamer  Executive functioning impairment (select all impairments): Sequencing, Problem solving                   Following commands: Impaired Following commands impaired: Follows one step commands with increased time      Cueing   Cueing Techniques: Verbal cues, Visual cues  Exercises      Shoulder Instructions       General Comments Girlfriend present throughout.  Very supportive.    Pertinent Vitals/ Pain       Pain Assessment Pain Assessment: Faces Faces Pain Scale: Hurts little more Pain Location: chest Pain Descriptors / Indicators: Sore Pain Intervention(s): Monitored during session, Repositioned  Home Living                                          Prior Functioning/Environment              Frequency  Min 2X/week        Progress Toward Goals  OT Goals(current goals can now be found in the care plan section)  Progress towards OT goals: Progressing toward goals  Acute Rehab OT Goals OT Goal Formulation: With patient/family Time For Goal Achievement: 09/11/23 Potential to Achieve Goals: Good  Plan      Co-evaluation    PT/OT/SLP Co-Evaluation/Treatment: Yes Reason for Co-Treatment: Complexity of the patient's impairments (multi-system involvement);For patient/therapist safety;To address functional/ADL transfers PT goals addressed during session: Mobility/safety with mobility;Balance;Proper use of DME;Strengthening/ROM OT goals addressed during session: Strengthening/ROM      AM-PAC OT "6 Clicks" Daily Activity     Outcome Measure   Help from another person eating meals?: A Little Help from another person taking care of personal grooming?: A Little Help from another person toileting, which includes using toliet, bedpan, or urinal?: Total Help from another person bathing (including washing, rinsing, drying)?: A Lot Help from another person to put on and taking off regular upper body clothing?: A Little Help from another person to put on and taking off regular lower body clothing?: Total 6 Click Score: 13    End of Session Equipment Utilized During Treatment: Gait belt;Oxygen  OT Visit Diagnosis: Unsteadiness on feet (R26.81);Muscle weakness (generalized) (M62.81);Other symptoms and signs involving cognitive function;Pain   Activity Tolerance Patient tolerated treatment well   Patient Left in  chair;with call bell/phone within reach;with chair alarm set;with family/visitor present   Nurse Communication Mobility status;Need for lift equipment        Time: [phone] OT Time Calculation (min): 39 min  Charges: OT General Charges $OT Visit: 1 Visit OT Treatments $Therapeutic Activity: 8-22 mins  Berna Spare, OTR/L Acute Rehabilitation Services Office: [phone]   Evern Bio 08/29/2023, 11:15 AM

## 2023-08-29 NOTE — Progress Notes (Addendum)
 Patient Name: BRYSAN MCEVOY Date of Encounter: 08/29/2023  Primary Cardiologist: None Electrophysiologist: None  Interval Summary   The patient is doing well today.  Got up and worked with PT, sat in chair, went to bathroom.  Pt reports he has claustrophobia with MRI  At this time, the patient denies chest pain, shortness of breath, or any new concerns.  Vital Signs    Vitals:   08/29/23 0400 08/29/23 0500 08/29/23 0700 08/29/23 0800  BP: (!) 135/91 (!) 147/88 (!) 155/96 129/75  Pulse: 92 93 (!) 105 93  Resp: 19 18 (!) 21 15  Temp:   98.7 F (37.1 C)   TempSrc:   Oral   SpO2: 98% 99% 95% 98%  Weight:  102.2 kg      Intake/Output Summary (Last 24 hours) at 08/29/2023 0950 Last data filed at 08/29/2023 0900 Gross per 24 hour  Intake 1297.13 ml  Output 2225 ml  Net -927.87 ml   Filed Weights   08/27/23 0500 08/28/23 0500 08/29/23 0500  Weight: 98.9 kg 101.1 kg 102.2 kg    Physical Exam    GEN- ill appearing adult male sitting in bed in NAD, alert / oriented to self/family. Has short term memory issues  Lungs- Clear to ausculation bilaterally, normal work of breathing Cardiac- Regular rate and rhythm, no murmurs, rubs or gallops GI- soft, NT, ND, + BS Extremities- no clubbing or cyanosis. No edema  Telemetry    SR 90-ST 120's bpm, multifocal PVC's, no further VT (personally reviewed)  Studies:  ECHO 08/26/23 > LVEF 30-35%  R/LHC 08/26/23 > mild non-obs CAD, severely elevated pre-capillary filling pressures, hemodynamics consistent with sub-optimal RV function cMRI 08/29/23 >    Arrhythmia / AAD VT / VF 08/26/23 > s/p OOH arrest      Hospital Course    CEVIN RUBINSTEIN is a 53 y.o. male with a PMH of HTN, HLD, relapsing-remitting MS on Lemtrada (walks with a walker at baseline), hypothyroidism, DM I admitted for OOH VF arrest requiring ~5min of ACLS before ROSC. Intubated / admitted to ICU. He required NE for hypotension. CT head / CTA were negative, CTA chest was  negative for PE but showed extensive consolidation throughout concerning for aspiration. Initial EKG on arrival showed ST with PVC's (lots of baseline artifact), subsequent EKG one minute later showed ST 110 with PVC's and prolonged QT (also with extensive artifact). Subsequent EKG shows NSR 76 bpm. He woke and was following commands, subsequently extubated and is on 4L [place]. He is being treated with ceftriaxone for aspiration PNA.  Amiodarone for VT / arrhythmia.  Cardiac MRI is pending.  He had further episode of VT on amio on 4/2 with trigger PVC and additional NSVT that appear automatic.   Assessment & Plan    Sudden Cardiac Death / OOH VF / Polymorphic VT Arrest  Monomorphic VT (08/28/23, ~2 min 33 sec sustained with pulse) NICM / LVEF 30-35% Questionable Transient Prolonged QTc   Hx of Tachypalpitations / Pre-Syncope before Admit Non-obs CAD by LHC, LVEF 30-35% on ECHO, cMRI pending. No family hx of SCD.  No decrease in functional status prior to admit. Questionable prolonged QT on admit but was post arrest and amiodarone with significant artifact on EKG.  Subsequent EKG's with normal QT.  Medications reviewed for his relapsing/remitting MS & no association with VT.    -await cMRI  -continue IV amiodarone for now  -GDMT per Advanced HF Team  -[place] need secondary prevention ICD before discharge  -  follow up single average EKG  -milrinone per AHF team   For questions or updates, please contact [facility] Please consult [email or web address] for contact info under Cardiology/STEMI.  Signed, Canary Brim, NP-C, AGACNP-BC [facility] - Electrophysiology  08/29/2023, 10:04 AM  I have seen and examined this patient with Canary Brim.  Agree with above, note added to reflect my findings.  Patient feeling well without acute complaints.  Remains on milrinone.  GEN: Well nourished, well developed, in no acute distress  HEENT: normal  Neck: no JVD, carotid bruits, or masses Cardiac: RRR; no  murmurs, rubs, or gallops,no edema  Respiratory:  clear to auscultation bilaterally, normal work of breathing GI: soft, nontender, nondistended, + BS MS: no deformity or atrophy  Skin: warm and dry, Neuro:  Strength and sensation are intact Psych: euthymic mood, full affect   Cardiac arrest due to ventricular fibrillation: Patient has had recovery.  He continues to have issues with short-term memory.  Due to his cardiac arrest, we Serah Nicoletti plan for ICD implant.  He has cardiac MRI pending.  Elzena Muston plan for ICD after his MRI has been performed.  If at all possible, would DC his right IJ catheter. Acute systolic heart failure: On medical therapy per heart failure cardiology  Kyndra Condron M. Silvano Garofano MD 08/29/2023 3:51 PM

## 2023-08-29 NOTE — Progress Notes (Signed)
 eLink Physician-Brief Progress Note Patient Name: Johnathan Ross DOB: 22-Jun-1970 MRN: [id number]   Date of Service  08/29/2023  HPI/Events of Note  Insomnia  eICU Interventions  Add melatonin as needed     Intervention Category Minor Interventions: Routine modifications to care plan (e.g. PRN medications for pain, fever)  Johnathan Ross 08/29/2023, 9:41 PM

## 2023-08-30 ENCOUNTER — Encounter (HOSPITAL_COMMUNITY)
Admission: EM | Disposition: A | Discharge status: Rehabilitation Facility | Payer: Self-pay | Source: Home / Self Care | Attending: Internal Medicine

## 2023-08-30 ENCOUNTER — Other Ambulatory Visit (HOSPITAL_COMMUNITY): Payer: Self-pay

## 2023-08-30 DIAGNOSIS — D72829 Elevated white blood cell count, unspecified: Secondary | ICD-10-CM

## 2023-08-30 DIAGNOSIS — R579 Shock, unspecified: Secondary | ICD-10-CM | POA: Diagnosis not present

## 2023-08-30 DIAGNOSIS — G35 Multiple sclerosis: Secondary | ICD-10-CM

## 2023-08-30 DIAGNOSIS — I5082 Biventricular heart failure: Secondary | ICD-10-CM | POA: Diagnosis not present

## 2023-08-30 DIAGNOSIS — I469 Cardiac arrest, cause unspecified: Secondary | ICD-10-CM | POA: Diagnosis not present

## 2023-08-30 DIAGNOSIS — I4901 Ventricular fibrillation: Principal | ICD-10-CM

## 2023-08-30 DIAGNOSIS — R5381 Other malaise: Secondary | ICD-10-CM | POA: Diagnosis not present

## 2023-08-30 HISTORY — PX: ICD IMPLANT: EP1208

## 2023-08-30 LAB — BASIC METABOLIC PANEL WITH GFR
Anion gap: 10 (ref 5–15)
BUN: 16 mg/dL (ref 6–20)
CO2: 28 mmol/L (ref 22–32)
Calcium: 8.4 mg/dL — ABNORMAL LOW (ref 8.9–10.3)
Chloride: 98 mmol/L (ref 98–111)
Creatinine, Ser: 1 mg/dL (ref 0.61–1.24)
GFR, Estimated: >60 mL/min (ref >60)
Glucose, Bld: 149 mg/dL — ABNORMAL HIGH (ref 70–99)
Potassium: 4.1 mmol/L (ref 3.5–5.1)
Sodium: 136 mmol/L (ref 135–145)

## 2023-08-30 LAB — SURGICAL PCR SCREEN
MRSA, PCR: NEGATIVE
Staphylococcus aureus: NEGATIVE

## 2023-08-30 LAB — GLUCOSE, CAPILLARY
Glucose-Capillary: 122 mg/dL — ABNORMAL HIGH (ref 70–99)
Glucose-Capillary: 105 mg/dL — ABNORMAL HIGH (ref 70–99)
Glucose-Capillary: 117 mg/dL — ABNORMAL HIGH (ref 70–99)
Glucose-Capillary: 201 mg/dL — ABNORMAL HIGH (ref 70–99)
Glucose-Capillary: 385 mg/dL — ABNORMAL HIGH (ref 70–99)

## 2023-08-30 LAB — CBC
HCT: 39.7 % (ref 39.0–52.0)
Hemoglobin: 13.5 g/dL (ref 13.0–17.0)
MCH: 28.8 pg (ref 26.0–34.0)
MCHC: 34 g/dL (ref 30.0–36.0)
MCV: 84.8 fL (ref 80.0–100.0)
Platelets: 173 10*3/uL (ref 150–400)
RBC: 4.68 MIL/uL (ref 4.22–5.81)
RDW: 13.6 % (ref 11.5–15.5)
WBC: 12.7 10*3/uL — ABNORMAL HIGH (ref 4.0–10.5)
nRBC: 0 % (ref 0.0–0.2)

## 2023-08-30 LAB — COOXEMETRY PANEL
Carboxyhemoglobin: 1.7 % — ABNORMAL HIGH (ref 0.5–1.5)
Methemoglobin: <0.7 % (ref 0.0–1.5)
O2 Saturation: 70.8 %
Total hemoglobin: 13.8 g/dL (ref 12.0–16.0)

## 2023-08-30 LAB — MAGNESIUM: Magnesium: 2 mg/dL (ref 1.7–2.4)

## 2023-08-30 SURGERY — ICD IMPLANT

## 2023-08-30 MED ORDER — SODIUM CHLORIDE 0.9 % IV SOLN
80.0000 mg | INTRAVENOUS | Status: AC
  Filled 2023-08-30: qty 2

## 2023-08-30 MED ORDER — ACETAMINOPHEN 325 MG PO TABS: 325.0000 mg | ORAL_TABLET | ORAL | Status: DC | PRN

## 2023-08-30 MED ORDER — METHYLPREDNISOLONE SODIUM SUCC 125 MG IJ SOLR
125.0000 mg | Freq: Once | INTRAMUSCULAR | Status: AC
  Administered 2023-08-30: 125 mg via INTRAVENOUS
  Filled 2023-08-30: qty 2

## 2023-08-30 MED ORDER — CEFAZOLIN SODIUM-DEXTROSE 2-4 GM/100ML-% IV SOLN
2.0000 g | INTRAVENOUS | Status: AC
  Administered 2023-08-30: 2 g via INTRAVENOUS
  Filled 2023-08-30: qty 100

## 2023-08-30 MED ORDER — SODIUM CHLORIDE 0.9 % IV SOLN
INTRAVENOUS | Status: AC
  Administered 2023-08-30: 80 mg
  Filled 2023-08-30: qty 2

## 2023-08-30 MED ORDER — IOHEXOL 350 MG/ML SOLN
INTRAVENOUS | Status: DC | PRN
  Administered 2023-08-30: 15 mL

## 2023-08-30 MED ORDER — LIDOCAINE HCL (PF) 1 % IJ SOLN
INTRAMUSCULAR | Status: AC
  Filled 2023-08-30: qty 30

## 2023-08-30 MED ORDER — LIDOCAINE HCL 1 % IJ SOLN
INTRAMUSCULAR | Status: AC
  Filled 2023-08-30: qty 40

## 2023-08-30 MED ORDER — CHLORHEXIDINE GLUCONATE 4 % EX SOLN
60.0000 mL | Freq: Once | CUTANEOUS | Status: AC
  Administered 2023-08-30: 4 via TOPICAL
  Filled 2023-08-30: qty 60

## 2023-08-30 MED ORDER — CEFAZOLIN SODIUM-DEXTROSE 2-4 GM/100ML-% IV SOLN
INTRAVENOUS | Status: AC
Start: 2023-08-30 — End: 2023-08-30
  Administered 2023-08-30: 2 g
  Filled 2023-08-30: qty 100

## 2023-08-30 MED ORDER — DIPHENHYDRAMINE HCL 50 MG/ML IJ SOLN: 25.0000 mg | Freq: Once | INTRAMUSCULAR | Status: DC

## 2023-08-30 MED ORDER — MIDAZOLAM HCL 2 MG/2ML IJ SOLN
INTRAMUSCULAR | Status: AC
  Filled 2023-08-30: qty 2

## 2023-08-30 MED ORDER — DIPHENHYDRAMINE HCL 50 MG/ML IJ SOLN
25.0000 mg | Freq: Once | INTRAMUSCULAR | Status: AC
  Administered 2023-08-30: 25 mg via INTRAVENOUS
  Filled 2023-08-30: qty 1

## 2023-08-30 MED ORDER — CEFAZOLIN SODIUM-DEXTROSE 1-4 GM/50ML-% IV SOLN
1.0000 g | Freq: Four times a day (QID) | INTRAVENOUS | Status: AC
  Administered 2023-08-30 – 2023-08-31 (×3): 1 g via INTRAVENOUS
  Filled 2023-08-30 (×3): qty 50

## 2023-08-30 MED ORDER — LIDOCAINE HCL (PF) 1 % IJ SOLN
INTRAMUSCULAR | Status: DC | PRN
  Administered 2023-08-30: 55 mL

## 2023-08-30 MED ORDER — HEPARIN (PORCINE) IN NACL 1000-0.9 UT/500ML-% IV SOLN
INTRAVENOUS | Status: DC | PRN
  Administered 2023-08-30: 500 mL

## 2023-08-30 MED ORDER — ENSURE ENLIVE PO LIQD
237.0000 mL | Freq: Three times a day (TID) | ORAL | Status: DC
  Administered 2023-08-31 – 2023-09-03 (×10): 237 mL via ORAL

## 2023-08-30 MED ORDER — SPIRONOLACTONE 25 MG PO TABS
25.0000 mg | ORAL_TABLET | Freq: Every day | ORAL | Status: DC
  Administered 2023-08-31 – 2023-09-03 (×4): 25 mg via ORAL
  Filled 2023-08-30 (×4): qty 1

## 2023-08-30 MED ORDER — FENTANYL CITRATE (PF) 100 MCG/2ML IJ SOLN
INTRAMUSCULAR | Status: AC
Start: 2023-08-30 — End: ?
  Filled 2023-08-30: qty 2

## 2023-08-30 MED ORDER — SODIUM CHLORIDE 0.9 % IV SOLN: INTRAVENOUS | Status: DC

## 2023-08-30 MED ORDER — CHLORHEXIDINE GLUCONATE 4 % EX SOLN
60.0000 mL | Freq: Once | CUTANEOUS | Status: DC
  Filled 2023-08-30: qty 60

## 2023-08-30 SURGICAL SUPPLY — 14 items
CABLE SURGICAL S-101-97-12 (CABLE) ×1 IMPLANT
GUIDEWIRE ANGLED .035X150CM (WIRE) IMPLANT
ICD GALLANT VR CDVRA500Q (ICD Generator) IMPLANT
KIT MICROPUNCTURE NIT STIFF (SHEATH) IMPLANT
LEAD DURATA 7122Q-65CM (Lead) IMPLANT
MAT PREVALON FULL STRYKER (MISCELLANEOUS) IMPLANT
PAD DEFIB RADIO PHYSIO CONN (PAD) ×1 IMPLANT
SHEATH 7FR PRELUDE SNAP 13 (SHEATH) IMPLANT
SHEATH 7FR PRELUDE SNAP 25 (SHEATH) IMPLANT
SHEATH 9FR PRELUDE SNAP 13 (SHEATH) IMPLANT
SHEATH 9FR PRELUDE SNAP 25 (SHEATH) IMPLANT
SHEATH PINNACLE 8F 10CM (SHEATH) IMPLANT
TRAY PACEMAKER INSERTION (PACKS) ×1 IMPLANT
WIRE HI TORQ VERSACORE-J 145CM (WIRE) IMPLANT

## 2023-08-30 NOTE — Progress Notes (Signed)
 Signal average ECG was read today.  Is normal in all 3 domains

## 2023-08-30 NOTE — Progress Notes (Signed)
 Central line removal: Arrived to room, pt up in chair. Discussed with Danne Harbor, RN: he reports he will remove central line.

## 2023-08-30 NOTE — Hospital Course (Addendum)
 The patient is a 53 year old overweight African-American male with a past medical history significant for relapsing remitting MS on [place], hyperlipidemia, hypertension, hypothyroidism, diabetes mellitus type 1 as well as other comorbidities who presented to the ED after collapsing requiring bystander CPR and is found to be in V-fib V. tach arrest.  CPR lasted about 20 minutes and he was received shocked 8 times and epi x 2 and amiodarone.  He is brought to the ED no is normal sinus rhythm and required peripheral neuropathy.  No PE was done and is admitted to the critical care service.  Cardiology evaluated and patient was found to have a mixed cardiogenic and septic shock on admission which is now improved.  Further workup also done and showed acute biventricular systolic CHF with right ventricular dominant shock.  EP and advanced heart failure team consulted.  He was transferred to [facility] service on 08/30/2023.  He underwent further workup and is s/p ICD placement and now being transitioned from IV Amiodarone to oral Amiodarone on 08/31/23. He is medically stable for D/C to CIR and will be transferred there today.   Assessment and Plan:  Witnessed out of hospital Vfib/VTach arrest x 20 min intermittent-ROSC after multiple defibrillations and bystander CPR" had received epi and amnio trops flat, EF down to 30-35%, query primary arrythmia and new cardiomyopathy; On Amio as on Milrinone gtt per AHF team along with Diuresis and GDMT. Cardiac MRI done and getting showed no evidence of LGE as below and also did show mild to moderate decrease in left ventricular systolic function of 40% and the mild decrease in right ventricular systolic function with RV of 45%. He is s/p ICD placement and being transitioned from IV Amiodarone to oral 400 mg po Daily  PT OT recommending acute inpatient rehabilitation and is medically stable and will D/C today   Mix cardiogenic and septic (aspiration), improved: Septic shock is resolved  and he is blood pressure stable off pressors.  WBC continue to trend and he has received his short course of antibiotics and completed with IV ceftriaxone.  Blood cultures showing no growth to date. PCT was 21.78 on last check last week. WBC trending down on last check.   Acute biventricular Systolic Heart Failure with Right Ventricular dominant shock Further care per Cardiology.  BNP was 102.9 on admission he is on now on Entresto 49/51 twice daily, spironolactone 12 5 mg p.o. daily, off of milrinone is Co-Ox stable.  Central line has been removed.  No evidence of late gadolinium enhancement in the left ventricular myocardium and had normal parametric mapping and tissue characterization on the cardiac MRI and the cardiologist feels that it is hopefully dysfunction postarrest stunning.  Received ICD 08/30/23. Deferring initiation of SGLT2 inhibitor to Cardiology. Stable from a Heart failure perspective and they have signed off and will obtain Genetic testing in the outpt setting  Post arrest Encephalopathy: Had Some lingering delirium but overall improved and appears baseline; has Haloperidol 5 mg as needed. Delirium Precautions  Post arrest resp failure with probable aspiration: improving, some lingering hypoxemia; doing well with IS. SpO2: 100 %; O2 Flow Rate (L/min): 2 L/min, FiO2 (%): 40 %. CXR the day before yesterday done and showed "The heart size and mediastinal contours are within normal limits. Left-sided defibrillator is unchanged. Mild central pulmonary vascular congestion may be present. No consolidative process is noted. The visualized skeletal structures are unremarkable."  History of HTN, HLD: Further care per Cardiology  Type 1 DM: Off of insulin  drip. Semglee 35 units twice daily changed to just daily.  Resistant NovoLog sliding scale insulin every 4 to sensitive NovoLog just AC and at bedtime and was being placed back on Insulin Pump but didn't have the equipment and CIR will manage with  Sq insulin Regimen now. DM Education Coordinator consulted and making adjustments and added Novolog 4 units TID   Multiple Sclerosis: C/w Dalfampridine TB12 10 mg po BID  Hypothyroidism: TSH was 2.213, continue Levothyroxine 125 mcg p.o. daily  RLS Type Symptoms- related to MS, some breakthrough, now on ropinirole 0.25 mg p.o. nightly  Leukocytosis: Improving. Had worsened in the setting of Steroid Demargination (received Solumedrol w/ Pacer placement). WBC went from 27.1 -> 23.9 -> 18.4 -> 12.7 -> 17.6 -> 22.1 -> 12.6. CTM and Trend and Repeat CBC in the AM   AKI on CKD Stage 2: BUN/Cr Improved and is now 19/1.03 on the last check. Avoid Nephrotoxic Medications, Contrast Dyes, Hypotension and Dehydration to Ensure Adequate Renal Perfusion and will need to Renally Adjust Meds -Continue to Monitor and Trend Renal Function carefully and repeat CMP in the AM   Abnormal LFTs and Acute Liver Injury in the setting Shock and Cardiac Arrest: LFTs resolved and normalized. AST peaked at 290 and ALT peaked at 242. Repeat LFTs intermittently.  Hypoalbuminemia: Patient's Albumin now 3.0. CTM and Trend and repeat CMP in the AM  Overweight: Complicates overall prognosis and care. Estimated body mass index is 28.82 kg/m as calculated from the following:   Height as of this encounter: 6\' 1"  (1.854 m).   Weight as of this encounter: 99.1 kg. Weight Loss and Dietary Counseling given

## 2023-08-30 NOTE — Consult Note (Signed)
 Physical Medicine and Rehabilitation Consult Reason for Consult: Impaired functional mobility Referring Physician: Marland Mcalpine   HPI: Johnathan Ross is a 53 y.o. male with a history of hypertension, relapsing remitting MS, and type 1 diabetes who was admitted on 08/26/2023 after collapsing in the community and receiving CPR before he was found by EMS.  Patient was found to have ventricular fibrillation.  He went through 20 minutes of ACLS protocol, was intubated and brought to the emergency room was found to be in sinus rhythm with prolonged QTc.  CT of the head and CTA of the chest did not reveal any acute abnormalities although there was some concern over aspiration in the lungs.  Patient with postarrest encephalopathy which is showing some signs of improvement.  He was placed on ceftriaxone for pneumonia.  He had a cardiac MRI performed yesterday and no scar or right ventricular abnormalities noted.  He continues on IV amiodarone and is n.p.o. for a permanent pacemaker to be placed today.  Patient was up with therapy yesterday and transferred sit to stand with min to mod assist.  They addressed standing with rolling walker and later with stedy.  Gait was not assessed due to lower extremity weakness.  Per therapy girlfriend is to bring in his AFOs.  Patient lives with family and 1 level house with 4 steps to enter.  Patient uses a rolling walker at times and also furniture walked at home.  He was driving and modified independent  Review of Systems  Constitutional:  Positive for malaise/fatigue.  HENT: Negative.    Eyes:  Negative for double vision.  Respiratory:  Positive for cough and shortness of breath.   Cardiovascular:  Positive for chest pain.  Gastrointestinal:  Positive for nausea.  Genitourinary:  Negative for dysuria.  Musculoskeletal:  Negative for myalgias.  Skin: Negative.   Neurological:  Positive for focal weakness and weakness.  Psychiatric/Behavioral:  Negative for  substance abuse.    Past Medical History:  Diagnosis Date   Hyperlipidemia    Hypertension    Hypogonadism in male 11/01/2016   Hypothyroidism    Multiple sclerosis (HCC)    Neuromuscular disorder (HCC)    Proliferative diabetic retinopathy(362.02)    Type 1 diabetes mellitus (HCC) dx'd 1994   Vitamin D insufficiency 11/29/2016   Past Surgical History:  Procedure Laterality Date   EYE SURGERY Bilateral    "laser for diabetic retinopathy"   FRACTURE SURGERY     IR VENO/EXT/UNI LEFT  03/24/2020   OPEN REDUCTION INTERNAL FIXATION (ORIF) TIBIA/FIBULA FRACTURE Right 10/30/2016   ORIF ANKLE FRACTURE Left 2015   ORIF TIBIA FRACTURE Right 10/30/2016   Procedure: OPEN REDUCTION INTERNAL FIXATION (ORIF) TIBIA FIBULA FRACTURE;  Surgeon: Myrene Galas, MD;  Location: [facility] OR;  Service: Orthopedics;  Laterality: Right;   RETINAL LASER PROCEDURE Bilateral    "for diabetic retinopathy"   RIGHT/LEFT HEART CATH AND CORONARY ANGIOGRAPHY N/A 08/26/2023   Procedure: RIGHT/LEFT HEART CATH AND CORONARY ANGIOGRAPHY;  Surgeon: Dorthula Nettles, DO;  Location: [facility] IN[facility]IVE [facility];  Service: Cardiovascular;  Laterality: N/A;   Family History  Problem Relation Age of Onset   Diabetes Sister    Colon polyps Maternal Uncle    Colon cancer Maternal Grandmother    Stomach cancer Cousin    Esophageal cancer Neg Hx    Social History:  reports that he has never smoked. He has never used smokeless tobacco. He reports that he does not drink alcohol and does not use  drugs. Allergies:  Allergies  Allergen Reactions   Zestril [Lisinopril] Cough   Ivp Dye [Iodinated Contrast Media] Hives and Itching   Medications Prior to Admission  Medication Sig Dispense Refill   baclofen (LIORESAL) 10 MG tablet Take 1 tablet (10 mg total) by mouth 3 (three) times daily. 270 tablet 3   dalfampridine 10 MG TB12 Take 1 tablet (10 mg total) by mouth 2 (two) times daily. 180 tablet 3   levocetirizine (XYZAL) 5 MG tablet TAKE 1  TABLET BY MOUTH EVERY DAY IN THE EVENING 90 tablet 1   losartan (COZAAR) 50 MG tablet Take 1 tablet (50 mg total) by mouth daily. 90 tablet 3   NOVOLOG 100 UNIT/ML injection USE UP TO 80 UNITS IN INSULIN PUMP DAILY-DX CODE E10.8 70 mL 2   simvastatin (ZOCOR) 20 MG tablet Take 1 tablet (20 mg total) by mouth daily at 6 PM. 90 tablet 0   SYNTHROID 125 MCG tablet Take 1 tablet by mouth 6 days per week. 78 tablet 3    Home: Home Living Family/patient expects to be discharged to:: Inpatient rehab Living Arrangements: Spouse/significant other Available Help at Discharge: Family, Available 24 hours/day (girlfriend works outside the home; additional family members also able to provide support) Type of Home: House Home Access: Stairs to enter [employer] of Steps: 4 Entrance Stairs-Rails: Left, Right, Can reach both Home Layout: One level Bathroom Shower/Tub: Engineer, manufacturing systems: Standard Home Equipment: Agricultural consultant (2 wheels), [employer] - single point  Functional History: Prior Function Prior Level of Function : Independent/Modified Independent, Driving Mobility Comments: walks with furniture or walls at home, RW for long distance; pt and family repot falls in the past, but not recently ADLs Comments: Independent to Mod I with ADls and IADLs; drives; enjoys drawing Functional Status:  Mobility: Bed Mobility Overal bed mobility: Needs Assistance Bed Mobility: Supine to Sit Supine to sit: Mod assist General bed mobility comments: bed in chair position, able to pull trunk forward away from mattress with mod assist Transfers Overall transfer level: Needs assistance Equipment used: Rolling walker (2 wheels), Ambulation equipment used Transfers: Sit to/from Stand, Bed to chair/wheelchair/BSC Sit to Stand: Mod assist, Min assist, From elevated surface, +2 physical assistance Bed to/from chair/wheelchair/BSC transfer type:: Via Lift equipment Transfer via Lift Equipment:  Stedy General transfer comment: stood from egress position with RW +2 mod assist with use of pad under pelvis and gait belt; x 2 reps; stood with stedy x 3 reps (min) Ambulation/Gait General Gait Details: not ready due to bil LE weakness; girlfriend to bring in AFOs    ADL: ADL Overall ADL's : Needs assistance/impaired Eating/Feeding: Set up, Bed level Grooming: Set up, Contact guard assist, Bed level Upper Body Bathing: Minimal assistance, Moderate assistance, Bed level, Cueing for compensatory techniques Upper Body Bathing Details (indicate cue type and reason): with assist for line management Lower Body Bathing: Maximal assistance, Bed level, Cueing for compensatory techniques, +2 for physical assistance, +2 for safety/equipment Upper Body Dressing : Maximal assistance, Sitting Upper Body Dressing Details (indicate cue type and reason): front opening gown Lower Body Dressing: Total assistance, Bed level, +2 for safety/equipment, +2 for physical assistance Toilet Transfer Details (indicate cue type and reason): not attempted this session for pt/therapist safety Toileting- Clothing Manipulation and Hygiene: Total assistance, +2 for physical assistance, +2 for safety/equipment, Bed level General ADL Comments: Pt with decreased activity tolerance, noted cognitive deficits, and limited active ROM in B LE affecting functional level.  Cognition: Cognition Orientation  Level: Oriented to person, Oriented to place, Disoriented to situation, Disoriented to time Cognition Arousal: Alert Behavior During Therapy: Impulsive  Blood pressure 123/78, pulse 84, temperature 98.7 F (37.1 C), temperature source Oral, resp. rate 16, height 6\' 1"  (1.854 m), weight 100.3 kg, SpO2 97%. Physical Exam Constitutional:      General: He is not in acute distress. HENT:     Head: Normocephalic.     Right Ear: External ear normal.     Left Ear: External ear normal.     Nose: Nose normal.     Mouth/Throat:      Mouth: Mucous membranes are moist.  Eyes:     Pupils: Pupils are equal, round, and reactive to light.  Cardiovascular:     Rate and Rhythm: Normal rate.  Pulmonary:     Effort: Pulmonary effort is normal.  Abdominal:     Palpations: Abdomen is soft.  Musculoskeletal:        General: No swelling or tenderness.     Cervical back: Normal range of motion.  Skin:    General: Skin is warm.  Neurological:     Mental Status: He is alert.     Comments: Pt is alert and oriented to person, place, reason, month/year. Normal language and speech.   STM and attention deficits noted. Fair insight and awareness. UE motor 4/5 prox to distal. LE: HF 2- to 2/5, KE 3-/5, ADF/PF 0-tr/5. Sensory exam normal for light touch and pain in all 4 limbs. No limb ataxia or cerebellar signs. No abnormal tone appreciated.  DTR's trace. Pt had significant difficulty advancing either leg. Uses core and UE to assist movement of each leg. Significant genu recurvatum bilaterally in stance.   Psychiatric:        Mood and Affect: Mood normal.     Results for orders placed or performed during the hospital encounter of 08/26/23 (from the past 24 hours)  Glucose, capillary     Status: Abnormal   Collection Time: 08/29/23 11:33 AM  Result Value Ref Range   Glucose-Capillary 224 (H) 70 - 99 mg/dL  Glucose, capillary     Status: Abnormal   Collection Time: 08/29/23  5:36 PM  Result Value Ref Range   Glucose-Capillary 229 (H) 70 - 99 mg/dL  Glucose, capillary     Status: Abnormal   Collection Time: 08/29/23  8:38 PM  Result Value Ref Range   Glucose-Capillary 134 (H) 70 - 99 mg/dL  Glucose, capillary     Status: None   Collection Time: 08/29/23 11:37 PM  Result Value Ref Range   Glucose-Capillary 89 70 - 99 mg/dL  Glucose, capillary     Status: Abnormal   Collection Time: 08/30/23  4:30 AM  Result Value Ref Range   Glucose-Capillary 122 (H) 70 - 99 mg/dL  Cooxemetry Panel (carboxy, met, total hgb, O2 sat)     Status:  Abnormal   Collection Time: 08/30/23  5:30 AM  Result Value Ref Range   Total hemoglobin 13.8 12.0 - 16.0 g/dL   O2 Saturation 13.0 %   Carboxyhemoglobin 1.7 (H) 0.5 - 1.5 %   Methemoglobin <0.7 0.0 - 1.5 %  CBC     Status: Abnormal   Collection Time: 08/30/23  5:49 AM  Result Value Ref Range   WBC 12.7 (H) 4.0 - 10.5 K/uL   RBC 4.68 4.22 - 5.81 MIL/uL   Hemoglobin 13.5 13.0 - 17.0 g/dL   HCT 86.5 78.4 - 69.6 %   MCV 84.8  80.0 - 100.0 fL   MCH 28.8 26.0 - 34.0 pg   MCHC 34.0 30.0 - 36.0 g/dL   RDW 16.1 09.6 - 04.5 %   Platelets 173 150 - 400 K/uL   nRBC 0.0 0.0 - 0.2 %  Basic metabolic panel with GFR     Status: Abnormal   Collection Time: 08/30/23  5:49 AM  Result Value Ref Range   Sodium 136 135 - 145 mmol/L   Potassium 4.1 3.5 - 5.1 mmol/L   Chloride 98 98 - 111 mmol/L   CO2 28 22 - 32 mmol/L   Glucose, Bld 149 (H) 70 - 99 mg/dL   BUN 16 6 - 20 mg/dL   Creatinine, Ser 4.09 0.61 - 1.24 mg/dL   Calcium 8.4 (L) 8.9 - 10.3 mg/dL   GFR, Estimated >81 >19 mL/min   Anion gap 10 5 - 15  Magnesium     Status: None   Collection Time: 08/30/23  5:49 AM  Result Value Ref Range   Magnesium 2.0 1.7 - 2.4 mg/dL  Glucose, capillary     Status: Abnormal   Collection Time: 08/30/23  8:07 AM  Result Value Ref Range   Glucose-Capillary 105 (H) 70 - 99 mg/dL   *Note: Due to a large number of results and/or encounters for the requested time period, some results have not been displayed. A complete set of results can be found in Results Review.   MR CARDIAC MORPHOLOGY W WO CONTRAST Result Date: 08/29/2023 CLINICAL DATA:  Clinical question of VF arrest Study assumes BSA of 2.29 m2. EXAM: CARDIAC MRI TECHNIQUE: The patient was scanned on a 1.5 Tesla GE magnet. A dedicated cardiac coil was used. Functional imaging was done using Fiesta sequences. 2,3, and 4 chamber views were done to assess for RWMA's. Modified Simpson's rule using a short axis stack was used to calculate an ejection fraction on  a dedicated work Research officer, trade [employer]. The patient received 10 cc of Gadavist. After 10 minutes inversion recovery sequences were used to assess for infiltration and scar tissue. Flow quantification was performed 2 times during this examination with flow quantification performed at the levels of the ascending aorta above the valve, pulmonary artery above the valve. CONTRAST:  10 cc  of Gadavist FINDINGS: 1. Normal left ventricular size, with LVEDD 52 mm, and LVEDVi 67 mL/m2. Normal left ventricular thickness. Mild to moderate decrease in left ventricular systolic function (LVEF =40%). There are no regional wall motion abnormalities, but global hypokinesis. Normal resting perfusion. Left ventricular parametric mapping notable for normal T2 and ECV. There is no late gadolinium enhancement in the left ventricular myocardium. There is no evidence of LV thrombus. 2. Normal right ventricular size with RVEDVI 69 mL/m2. Normal right ventricular thickness. Mild decrease in right ventricular systolic function (RVEF =45%). There is septal and apical hypokinesis with preserved planar excursion. 3.  Normal left and right atrial size. 4. Normal size of the aortic root, ascending aorta and pulmonary artery. 5. Valve assessment: Aortic Valve: Tri-leaflet aortic valve. Qualitatively, there is no significant regurgitation. Regurgitant fraction <1%. Pulmonic Valve: Qualitatively, there is no significant regurgitation. Regurgitant fraction < 1%. Tricuspid Valve: Qualitatively, there is no significant regurgitation. Regurgitant fraction 1%. Mitral Valve: Qualitatively, there is no significant regurgitation. Regurgitant fraction 1%. 6.  Normal pericardium.  No pericardial effusion. 7. Bilateral dependent ground glass opacities and consolidation. Recommended dedicated study if concerned for non-cardiac pathology. 8. Breath hold artifacts noted. Study was performed free breathing. This decreased the  sensitivity of volumetric  assessments. IMPRESSION: 1. Mild to moderate decrease in left ventricular systolic function (LVEF =40%). 2. There is no late gadolinium enhancement in the left ventricular myocardium. Normal parametric mapping and tissue characterization. 3. Mild decrease in right ventricular systolic function (RVEF =45%). Riley Lam MD Electronically Signed   By: Riley Lam M.D.   On: 08/29/2023 18:37   MR CARDIAC VELOCITY FLOW MAP Result Date: 08/29/2023 CLINICAL DATA:  Clinical question of VF arrest Study assumes BSA of 2.29 m2. EXAM: CARDIAC MRI TECHNIQUE: The patient was scanned on a 1.5 Tesla GE magnet. A dedicated cardiac coil was used. Functional imaging was done using Fiesta sequences. 2,3, and 4 chamber views were done to assess for RWMA's. Modified Simpson's rule using a short axis stack was used to calculate an ejection fraction on a dedicated work Research officer, trade union. The patient received 10 cc of Gadavist. After 10 minutes inversion recovery sequences were used to assess for infiltration and scar tissue. Flow quantification was performed 2 times during this examination with flow quantification performed at the levels of the ascending aorta above the valve, pulmonary artery above the valve. CONTRAST:  10 cc  of Gadavist FINDINGS: 1. Normal left ventricular size, with LVEDD 52 mm, and LVEDVi 67 mL/m2. Normal left ventricular thickness. Mild to moderate decrease in left ventricular systolic function (LVEF =40%). There are no regional wall motion abnormalities, but global hypokinesis. Normal resting perfusion. Left ventricular parametric mapping notable for normal T2 and ECV. There is no late gadolinium enhancement in the left ventricular myocardium. There is no evidence of LV thrombus. 2. Normal right ventricular size with RVEDVI 69 mL/m2. Normal right ventricular thickness. Mild decrease in right ventricular systolic function (RVEF =45%). There is septal and apical hypokinesis with  preserved planar excursion. 3.  Normal left and right atrial size. 4. Normal size of the aortic root, ascending aorta and pulmonary artery. 5. Valve assessment: Aortic Valve: Tri-leaflet aortic valve. Qualitatively, there is no significant regurgitation. Regurgitant fraction <1%. Pulmonic Valve: Qualitatively, there is no significant regurgitation. Regurgitant fraction < 1%. Tricuspid Valve: Qualitatively, there is no significant regurgitation. Regurgitant fraction 1%. Mitral Valve: Qualitatively, there is no significant regurgitation. Regurgitant fraction 1%. 6.  Normal pericardium.  No pericardial effusion. 7. Bilateral dependent ground glass opacities and consolidation. Recommended dedicated study if concerned for non-cardiac pathology. 8. Breath hold artifacts noted. Study was performed free breathing. This decreased the sensitivity of volumetric assessments. IMPRESSION: 1. Mild to moderate decrease in left ventricular systolic function (LVEF =40%). 2. There is no late gadolinium enhancement in the left ventricular myocardium. Normal parametric mapping and tissue characterization. 3. Mild decrease in right ventricular systolic function (RVEF =45%). Riley Lam MD Electronically Signed   By: Riley Lam M.D.   On: 08/29/2023 18:37   MR CARDIAC VELOCITY FLOW MAP Result Date: 08/29/2023 CLINICAL DATA:  Clinical question of VF arrest Study assumes BSA of 2.29 m2. EXAM: CARDIAC MRI TECHNIQUE: The patient was scanned on a 1.5 Tesla GE magnet. A dedicated cardiac coil was used. Functional imaging was done using Fiesta sequences. 2,3, and 4 chamber views were done to assess for RWMA's. Modified Simpson's rule using a short axis stack was used to calculate an ejection fraction on a dedicated work Research officer, trade union. The patient received 10 cc of Gadavist. After 10 minutes inversion recovery sequences were used to assess for infiltration and scar tissue. Flow quantification was performed 2  times during this examination with flow quantification  performed at the levels of the ascending aorta above the valve, pulmonary artery above the valve. CONTRAST:  10 cc  of Gadavist FINDINGS: 1. Normal left ventricular size, with LVEDD 52 mm, and LVEDVi 67 mL/m2. Normal left ventricular thickness. Mild to moderate decrease in left ventricular systolic function (LVEF =40%). There are no regional wall motion abnormalities, but global hypokinesis. Normal resting perfusion. Left ventricular parametric mapping notable for normal T2 and ECV. There is no late gadolinium enhancement in the left ventricular myocardium. There is no evidence of LV thrombus. 2. Normal right ventricular size with RVEDVI 69 mL/m2. Normal right ventricular thickness. Mild decrease in right ventricular systolic function (RVEF =45%). There is septal and apical hypokinesis with preserved planar excursion. 3.  Normal left and right atrial size. 4. Normal size of the aortic root, ascending aorta and pulmonary artery. 5. Valve assessment: Aortic Valve: Tri-leaflet aortic valve. Qualitatively, there is no significant regurgitation. Regurgitant fraction <1%. Pulmonic Valve: Qualitatively, there is no significant regurgitation. Regurgitant fraction < 1%. Tricuspid Valve: Qualitatively, there is no significant regurgitation. Regurgitant fraction 1%. Mitral Valve: Qualitatively, there is no significant regurgitation. Regurgitant fraction 1%. 6.  Normal pericardium.  No pericardial effusion. 7. Bilateral dependent ground glass opacities and consolidation. Recommended dedicated study if concerned for non-cardiac pathology. 8. Breath hold artifacts noted. Study was performed free breathing. This decreased the sensitivity of volumetric assessments. IMPRESSION: 1. Mild to moderate decrease in left ventricular systolic function (LVEF =40%). 2. There is no late gadolinium enhancement in the left ventricular myocardium. Normal parametric mapping and tissue  characterization. 3. Mild decrease in right ventricular systolic function (RVEF =45%). Riley Lam MD Electronically Signed   By: Riley Lam M.D.   On: 08/29/2023 18:37    Assessment/Plan: Diagnosis: 53 year old male with a history of multiple sclerosis with further debility and mild hypoxic encephalopathy related to V-fib cardiac arrest/aspiration pneumonia Does the need for close, 24 hr/day medical supervision in concert with the patient's rehab needs make it unreasonable for this patient to be served in a less intensive setting? Yes Co-Morbidities requiring supervision/potential complications:  -Hypertension -Type 1 diabetes -Infectious disease considerations including treatment of pneumonia Due to bladder management, bowel management, safety, skin/wound care, disease management, medication administration, pain management, and patient education, does the patient require 24 hr/day rehab nursing? Yes Does the patient require coordinated care of a physician, rehab nurse, therapy disciplines of PT, OT, ?SLP to address physical and functional deficits in the context of the above medical diagnosis(es)? Yes Addressing deficits in the following areas: balance, endurance, locomotion, strength, transferring, bowel/bladder control, bathing, dressing, feeding, grooming, toileting, cognition, and psychosocial support Can the patient actively participate in an intensive therapy program of at least 3 hrs of therapy per day at least 5 days per week? Yes The potential for patient to make measurable gains while on inpatient rehab is excellent Anticipated functional outcomes upon discharge from inpatient rehab are supervision and min assist  with PT, supervision and min assist with OT, modified independent and supervision with SLP. Estimated rehab length of stay to reach the above functional goals is: 13-20 days Anticipated discharge destination: Home Overall Rehab/Functional Prognosis:  excellent  POST ACUTE RECOMMENDATIONS: This patient's condition is appropriate for continued rehabilitative care in the following setting: CIR Patient has agreed to participate in recommended program. Yes Note that insurance prior authorization may be required for reimbursement for recommended care.  Comment: Pt is very motivated albeit a bit stubborn regarding his techniques and procedures. We looked at  his current AFO's ([employer]-- which he doesn't regularly use) and they are not substantial enough to stabilize his ankles and knees. He has been using his upper extremities quite a bit as well PTA.  Upper extremity use will be somewhat limited, however, initially due to his ICD/PPM placement      I have personally performed a face to face diagnostic evaluation of this patient. Additionally, I have examined the patient's medical record including any pertinent labs and radiographic images.    Thanks,  Ranelle Oyster, MD 08/30/2023

## 2023-08-30 NOTE — Progress Notes (Addendum)
 Patient Name: Johnathan Ross Date of Encounter: 08/30/2023  Primary Cardiologist: None Electrophysiologist: None  Interval Summary   The patient is doing well today.  At this time, the patient denies chest pain, shortness of breath, or any new concerns.  Girlfriend at bedside.   Vital Signs    Vitals:   08/29/23 2022 08/29/23 2333 08/30/23 0414 08/30/23 0753  BP: (!) 154/94 110/76 135/73 123/78  Pulse: 98 71 82 84  Resp: 18 16 16 16   Temp: 98.6 F (37 C) 98.3 F (36.8 C) 98.5 F (36.9 C) 98.7 F (37.1 C)  TempSrc: Oral Oral Oral Oral  SpO2: 97% 99% 91% 97%  Weight:   100.3 kg   Height:        Intake/Output Summary (Last 24 hours) at 08/30/2023 1005 Last data filed at 08/30/2023 0419 Gross per 24 hour  Intake 763.2 ml  Output 2825 ml  Net -2061.8 ml   Filed Weights   08/28/23 0500 08/29/23 0500 08/30/23 0414  Weight: 101.1 kg 102.2 kg 100.3 kg    Physical Exam    GEN- ill appearing, alert and oriented x 3 today.   Lungs- Clear to ausculation bilaterally, normal work of breathing Cardiac- Regular rate and rhythm, no murmurs, rubs or gallops GI- soft, NT, ND, + BS Extremities- no clubbing or cyanosis. No edema  Telemetry    SR 80- ST105, occ PVC (personally reviewed)  Studies:  ECHO 08/26/23 > LVEF 30-35%  R/LHC 08/26/23 > mild non-obs CAD, severely elevated pre-capillary filling pressures, hemodynamics consistent with sub-optimal RV function cMRI 08/29/23 >    Arrhythmia / AAD VT / VF 08/26/23 > s/p OOH arrest    Hospital Course    Johnathan Ross is a 53 y.o. male with a PMH of HTN, HLD, relapsing-remitting MS on [place] (walks with a walker at baseline), hypothyroidism, DM I admitted for OOH VF arrest requiring ~27min of ACLS before ROSC. Intubated / admitted to ICU. He required NE for hypotension. CT head / CTA were negative, CTA chest was negative for PE but showed extensive consolidation throughout concerning for aspiration. Initial EKG on arrival showed  ST with PVC's (lots of baseline artifact), subsequent EKG one minute later showed ST 110 with PVC's and prolonged QT (also with extensive artifact). Subsequent EKG shows NSR 76 bpm. He woke and was following commands, subsequently extubated and is on 4L [place]. He is being treated with ceftriaxone for aspiration PNA.  Amiodarone for VT / arrhythmia.  Cardiac MRI is pending.  He had further episode of VT on amio on 4/2 with trigger PVC and additional NSVT that appear automatic.   Assessment & Plan    Sudden Cardiac Death / OOH VF / Polymorphic VT Arrest  Monomorphic VT (08/28/23, ~2 min 33 sec sustained with pulse) NICM / LVEF 30-35% Questionable Transient Prolonged QTc   Hx of Tachypalpitations / Pre-Syncope before Admit Non-obs CAD by LHC, LVEF 30-35% on ECHO, cMRI pending. No family hx of SCD.  No decrease in functional status prior to admit. Questionable prolonged QT on admit but was post arrest and amiodarone with significant artifact on EKG.  Subsequent EKG's with normal QT.  Medications reviewed for his relapsing/remitting MS & no association with VT.    -no scar noted on cMRI  -continue IV amiodarone  -GDMT per Advanced HF Team  -plan for ICD 08/30/23 > Johnathan Ross need to consent patients mother for procedure as he has some short term memory issues post arrest  -NPO for PPM  08/30/23 -no RV abnormalities on MRI, single average EKG noted / MRI reassuring       For questions or updates, please contact [facility] Please consult [email or web address] for contact info under Cardiology/STEMI.  Signed, Canary Brim, NP-C, AGACNP-BC [facility] - Electrophysiology  08/30/2023, 10:10 AM  I have seen and examined this patient with Canary Brim.  Agree with above, note added to reflect my findings.  Patient doing well this morning.  No acute complaints.  Continues to have issues with short-term memory.  GEN: Well nourished, well developed, in no acute distress  HEENT: normal  Neck: no JVD, carotid  bruits, or masses Cardiac: RRR; no murmurs, rubs, or gallops,no edema  Respiratory:  clear to auscultation bilaterally, normal work of breathing GI: soft, nontender, nondistended, + BS MS: no deformity or atrophy  Skin: warm and dry Neuro:  Strength and sensation are intact Psych: euthymic mood, full affect   Out-of-hospital cardiac arrest due to polymorphic VT/VF: Patient has nonobstructive coronary artery disease and a reduced ejection fraction.  Cardiac MRI without scar and improved ejection fraction to 40%.  Due to his out-of-hospital arrest, he would benefit from ICD implant.  Risks and benefits were discussed.  The patient understands these risks and has agreed to the procedure. Acute systolic heart failure: Ejection fraction significantly reduced.  Likely related to his cardiac arrest.  Continue medical therapy per heart failure cardiology  Explained risks, benefits, and alternatives to ICD implantation, including but not limited to bleeding, infection, pneumothorax, pericardial effusion, lead dislodgement, heart attack, stroke, or death.  Pt verbalized understanding and agrees to proceed.   Johnathan Ross M. Johnathan Margraf MD 08/30/2023 12:15 PM

## 2023-08-30 NOTE — Progress Notes (Signed)
 Spoke with primary RN via securechat & at bedside. Plan to hopefully get pacemaker today and then be discharged shortly after. Placed PIV in R arm in anticipation of pacer procedure today. Primary rn at bedside and aware to keep amio running thru central line until procedure

## 2023-08-30 NOTE — Progress Notes (Signed)
 Physical Therapy Treatment Patient Details Name: Johnathan Ross MRN: [id number] DOB: 08/13/1970 Today's Date: 08/30/2023   History of Present Illness 53 yo male admitted 3/31 after collapse and CPR of grossly 20 min with Vfib shock x 8. Intubated 3/31-4/1. 3/31 RHC with Rt heart failure. PMhx: HTN, DM, diabetic retinopathy, multiple sclerosis, Rt tib/fib fx    PT Comments  Pt in bed upon arrival with sister present and agreeable to PT session. Pt continues to need ModA for bed mobility with assist to bring LE's off EOB and slightly raise trunk. Pt has decreased static/dynamic seated balance and required B UE support and MinA/CGA. Unable to use B AFOs during session due to pt not having sneakers available. Despite this, pt was able to stand with ModAx2 and use of RW. Pt was then able to ambulate ~6 ft with ModAx2 and use of RW. Pt has B knee hyperextension and needs physical assist to unlock knees and assist for swing phase, L>R. When lowering to the recliner, pt had poor eccentric control and needed assist to lower. Pt is progressing well towards goals. Pt would continue to benefit from >3hrs post acute rehab to work towards independence with mobility. Pt has strong motivation, good family support, and was active prior to hospital admission. Anticipate pt will continue to progress well. Acute PT to follow.      If plan is discharge home, recommend the following: A lot of help with walking and/or transfers;A lot of help with bathing/dressing/bathroom;Assist for transportation;Assistance with cooking/housework;Supervision due to cognitive status;Help with stairs or ramp for entrance   Can travel by private vehicle      No  Equipment Recommendations  Other (comment) (TBD with progression)       Precautions / Restrictions Precautions Precautions: Fall;Other (comment) Recall of Precautions/Restrictions: Impaired Precaution/Restrictions Comments: MS with LB weakness Restrictions Weight Bearing  Restrictions Per Provider Order: No     Mobility  Bed Mobility Overal bed mobility: Needs Assistance Bed Mobility: Supine to Sit    Supine to sit: Mod assist, HOB elevated    General bed mobility comments: ModA to move LE's off EOB and to slightly assist with raising trunk. Cues for hand placement as pt had difficulty maintaing his balance in sitting. Increased time to scoot forwards    Transfers Overall transfer level: Needs assistance Equipment used: Rolling walker (2 wheels) (vs stedy) Transfers: Sit to/from Stand Sit to Stand: Mod assist, +2 physical assistance, +2 safety/equipment    General transfer comment: ModAx2 with use of gait belt for boost-up and steadying assist. Poor eccentric lowering with return to sitting after gait training    Ambulation/Gait Ambulation/Gait assistance: Mod assist, +2 physical assistance, +2 safety/equipment Gait Distance (Feet): 6 Feet Assistive device: Rolling walker (2 wheels) Gait Pattern/deviations: Step-to pattern, Knee hyperextension - right, Knee hyperextension - left, Decreased dorsiflexion - right, Decreased dorsiflexion - left, Shuffle Gait velocity: decr     General Gait Details: ModAx2 with physical assist needed to support trunk with PT assisting to unlock knees and facilitate swing phase. Pt with increased difficulty with LLE movement. Unable to use AFOs due to not having sneakers available     Balance Overall balance assessment: Needs assistance Sitting-balance support: No upper extremity supported, Feet supported Sitting balance-Leahy Scale: Poor Sitting balance - Comments: MinA to CGA, posterior pelvic tilt with flexed spine. Not able to accept challenges to balance Postural control: Posterior lean Standing balance support: Bilateral upper extremity supported, During functional activity, Reliant on assistive device for balance  Standing balance-Leahy Scale: Poor Standing balance comment: reliant on RW and external support        Communication Communication Communication: No apparent difficulties  Cognition Arousal: Alert Behavior During Therapy: WFL for tasks assessed/performed   PT - Cognitive impairments: No apparent impairments  Following commands: Intact      Cueing Cueing Techniques: Verbal cues, Visual cues     General Comments General comments (skin integrity, edema, etc.): VSS on 3L      Pertinent Vitals/Pain Pain Assessment Pain Assessment: No/denies pain     PT Goals (current goals can now be found in the care plan section) Acute Rehab PT Goals Patient Stated Goal: return home, draw PT Goal Formulation: With patient/family Time For Goal Achievement: 09/10/23 Potential to Achieve Goals: Good Progress towards PT goals: Progressing toward goals    Frequency    Min 3X/week       AM-PAC PT "6 Clicks" Mobility   Outcome Measure  Help needed turning from your back to your side while in a flat bed without using bedrails?: A Lot Help needed moving from lying on your back to sitting on the side of a flat bed without using bedrails?: Total Help needed moving to and from a bed to a chair (including a wheelchair)?: Total Help needed standing up from a chair using your arms (e.g., wheelchair or bedside chair)?: Total Help needed to walk in hospital room?: Total Help needed climbing 3-5 steps with a railing? : Total 6 Click Score: 7    End of Session Equipment Utilized During Treatment: Gait belt;Oxygen Activity Tolerance: Patient tolerated treatment well Patient left: with call bell/phone within reach;with family/visitor present;in chair;Other (comment) (CIR MD) Nurse Communication: Mobility status PT Visit Diagnosis: Other abnormalities of gait and mobility (R26.89);Difficulty in walking, not elsewhere classified (R26.2);Muscle weakness (generalized) (M62.81)     Time: 4098-1191 PT Time Calculation (min) (ACUTE ONLY): 25 min  Charges:    $Gait Training: 8-22  mins $Therapeutic Activity: 8-22 mins PT General Charges $$ ACUTE PT VISIT: 1 Visit                     Hilton Cork, PT, DPT Secure Chat Preferred  Rehab Office [phone]   Arturo Morton Brion Aliment 08/30/2023, 1:00 PM

## 2023-08-30 NOTE — Discharge Instructions (Signed)
 After Your ICD (Implantable Cardiac Defibrillator)   You have a [employer] ICD  ACTIVITY Do not lift your arm above shoulder height for 1 week after your procedure. After 7 days, you may progress as below.  You should remove your sling 24 hours after your procedure, unless otherwise instructed by your provider.     Friday September 06, 2023  Saturday September 07, 2023 Sunday September 08, 2023 Monday September 09, 2023   Do not lift, push, pull, or carry anything over 10 pounds with the affected arm until 6 weeks (Friday Oct 11, 2023 ) after your procedure.   You may drive AFTER your wound check, unless you have been told otherwise by your provider.   Ask your healthcare provider when you can go back to work   INCISION/Dressing  If large square, outer bandage is left in place, this can be removed after 24 hours from your procedure. Do not remove steri-strips or glue as below.   Monitor your defibrillator site for redness, swelling, and drainage. Call the device clinic at [phone] if you experience these symptoms or fever/chills.  If your incision is sealed with Steri-strips or staples, you may shower 7 days after your procedure or when told by your provider. Do not remove the steri-strips or let the shower hit directly on your site. You may wash around your site with soap and water.    If you were discharged in a sling, please do not wear this during the day more than 48 hours after your surgery unless otherwise instructed. This may increase the risk of stiffness and soreness in your shoulder.   Avoid lotions, ointments, or perfumes over your incision until it is well-healed.  You may use a hot tub or a pool AFTER your wound check appointment if the incision is completely closed.  Your ICD is designed to protect you from life threatening heart rhythms. Because of this, you may receive a shock.   1 shock with no symptoms:  Call the office during business hours. 1 shock with symptoms (chest pain,  chest pressure, dizziness, lightheadedness, shortness of breath, overall feeling unwell):  Call 911. If you experience 2 or more shocks in 24 hours:  Call 911. If you receive a shock, you should not drive for 6 months per the [place] DMV IF you receive appropriate therapy from your ICD.   ICD Alerts:  Some alerts are vibratory and others beep. These are NOT emergencies. Please call our office to let us know. If this occurs at night or on weekends, it can wait until the next business day. Send a remote transmission.  If your device is capable of reading fluid status (for heart failure), you will be offered monthly monitoring to review this with you.   DEVICE MANAGEMENT Remote monitoring is used to monitor your ICD from home. This monitoring is scheduled every 91 days by our office. It allows [place] to keep an eye on the functioning of your device to ensure it is working properly. You will routinely see your Electrophysiologist annually (more often if necessary).   You should receive your ID card for your new device in 4-8 weeks. Keep this card with you at all times once received. Consider wearing a medical alert bracelet or necklace.  Your ICD  may be MRI compatible. This will be discussed at your next office visit/wound check.  You should avoid contact with strong electric or magnetic fields.   Do not use amateur (ham) radio equipment or electric (arc)  welding torches. MP3 player headphones with magnets should not be used. Some devices are safe to use if held at least 12 inches (30 cm) from your defibrillator. These include power tools, lawn mowers, and speakers. If you are unsure if something is safe to use, ask your health care provider.  When using your cell phone, hold it to the ear that is on the opposite side from the defibrillator. Do not leave your cell phone in a pocket over the defibrillator.  You may safely use electric blankets, heating pads, computers, and microwave ovens.  Call the office  right away if: You have chest pain. You feel more than one shock. You feel more short of breath than you have felt before. You feel more light-headed than you have felt before. Your incision starts to open up.  This information is not intended to replace advice given to you by your health care provider. Make sure you discuss any questions you have with your health care provider.

## 2023-08-30 NOTE — Plan of Care (Signed)

## 2023-08-30 NOTE — Progress Notes (Addendum)
  Inpatient Rehabilitation Admissions Coordinator   Met with patient, sister and Mom at bedside for rehab assessment. We discussed goals and expectations of a possible CIR admit. Family will work on arranging 24/7 assist at home after CIR as recommended when girlfriend works. They prefer CIR for rehab. I will begin insurance Auth with [facility] for possible CIR admit pending approval. Likely will be Monday before determination is made.Please call me with any questions.   Ottie Glazier, RN, MSN Rehab Admissions Coordinator [phone]  I will place SLP order for cognitive eval for family state his cognition is off from baseline.  Ottie Glazier, RN, MSN Rehab Admissions Coordinator [phone] 08/30/2023 3:26 PM

## 2023-08-30 NOTE — Care Management Important Message (Signed)
 Important Message  Patient Details  Name: Johnathan Ross MRN: [id number] Date of Birth: [birth year]   Important Message Given:  Yes - Medicare IM     [name] 08/30/2023, 10:19 AM

## 2023-08-30 NOTE — Progress Notes (Signed)
 PROGRESS NOTE    Johnathan Ross  ZOX:[id number] DOB: April 23, 1971 DOA: 08/26/2023 PCP: Etta Grandchild, MD   Brief Narrative:  The patient is a 53 year old overweight African-American male with a past medical history significant for relapsing remitting MS on [place], hyperlipidemia, hypertension, hypothyroidism, diabetes mellitus type 1 as well as other comorbidities who presented to the ED after collapsing requiring bystander CPR and is found to be in V-fib V. tach arrest.  CPR lasted about 20 minutes and he was received shocked 8 times and epi x 2 and amiodarone.  He is brought to the ED no is normal sinus rhythm and required peripheral neuropathy.  No PE was done and is admitted to the critical care service.  Cardiology evaluated and patient was found to have a mixed cardiogenic and septic shock on admission which is now improved.  Further workup also done and showed acute biventricular systolic CHF with right ventricular dominant shock.  EP and advanced heart failure team consulted.  He was transferred to [facility] service on 08/30/2023.  He underwent further workup and will be getting an ICD placement later today.  Assessment and Plan:  Witnessed out of hospital Vfib/VTach arrest x 20 min intermittent-ROSC after multiple defibrillations and bystander CPR" had received epi and amnio trops flat, EF down to 30-35%, query primary arrythmia and new cardiomyopathy; On Amio as on Milrinone gtt per AHF team along with Diuresis and GDMT. Cardiac MRI done and getting showed no evidence of LGE as below and also did show mild to moderate decrease in left ventricular systolic function of 40% and the mild decrease in right ventricular systolic function with RV of 45%..  Plan is for ICD placement and will defer transitioning amiodarone drip to cardiology.  PT OT recommending acute inpatient rehabilitation  Mix cardiogenic and septic (aspiration), improved: Septic shock is resolved and he is blood pressure stable off  pressors.  WBC continue to trend and he has received his short course of antibiotics and completed with IV ceftriaxone.  Blood cultures showing no growth to date PCT was 21.78 and repeat in the morning  Acute biventricular Systolic Heart Failure with Right Ventricular dominant shock Further care per cardiology.  BNP was 102.9 on admission he is on now on Entresto 49/51 twice daily, spironolactone 12 5 mg p.o. daily, off of milrinone is Choloxin stable.  Central line has been removed.  No evidence of late gadolinium enhancement in the left ventricular myocardium and had normal parametric mapping and tissue characterization on the cardiac MRI and the cardiologist feels that it is hopefully dysfunction postarrest stunning.  He is getting an ICD today and will be initiated on SGLT2 inhibitor after he undergoes ICD.  Post arrest Encephalopathy: Some lingering delirium but overall improved and appears baseline; has haloperidol 5 mg as needed  Post arrest resp failure with probable aspiration: improved, some lingering hypoxemia; doing well with IS. SpO2: 97 % O2 Flow Rate (L/min): 3 L/min; FiO2 (%): 40 %  History of HTN, HLD: Further care per Cardiology  Type 1 DM: Off of insulin drip and he is now on Semglee 35 units twice daily.  Continue with moderate NovoLog/scale insulin every 4  Multiple Sclerosis: C/w Dalfampridine TB12 10 mg po BID  Hypothyroidism: TSH was 2.213, continue Levothyroxine 125 mcg p.o. daily  RLS type symptoms- related to MS, some breakthrough, now on ropinirole 0.25 mg p.o. nightly Leukocytosis: Improving. WBC went from 27.1 -> 23.9 -> 18.4 -> 12.7. CTM and Trend and Repeat CBC in  the AM   AKI on CKD Stage 3a: BUN/Cr Trend: Recent Labs  Lab 08/26/23 0120 08/26/23 0121 08/26/23 0434 08/27/23 0516 08/28/23 0104 08/29/23 0544 08/30/23 0549  BUN 11 11 11 16  26* 18 16  CREATININE 1.47* 1.40* 1.16 1.30* 1.33* 0.93 1.00  -Avoid Nephrotoxic Medications, Contrast Dyes,  Hypotension and Dehydration to Ensure Adequate Renal Perfusion and will need to Renally Adjust Meds -Continue to Monitor and Trend Renal Function carefully and repeat CMP in the AM   Abnormal LFTs and Acute Liver Injury in the setting Shock and Cardiac Arrest: LFT Trend: Recent Labs  Lab 08/25/23 1312 08/26/23 0120 08/26/23 0719 08/27/23 0516  AST 27 290* 228* 95*  ALT 22 242* 200* 133*  -Repeat LFTs in the AM and continue to Monitor Hepatic Fxn Trend  Overweight: Complicates overall prognosis and care. Estimated body mass index is 29.17 kg/m as calculated from the following:   Height as of this encounter: 6\' 1"  (1.854 m).   Weight as of this encounter: 100.3 kg. Weight Loss and Dietary Counseling given   DVT prophylaxis: heparin injection 5,000 Units Start: 08/26/23 0815 SCDs Start: 08/26/23 0205    Code Status: Full Code Family Communication: Discussed with family at bedside  Disposition Plan:  Level of care: Progressive Status is: Inpatient Remains inpatient appropriate because: With further clinical improvement and clearance by specialists   Consultants:  PCCM transfer Cardiology EP cardiology Advanced heart failure team  Procedures:  As delineated as above  Antimicrobials:  Anti-infectives (From admission, onward)    Start     Dose/Rate Route Frequency Ordered Stop   08/30/23 1300  gentamicin (GARAMYCIN) 80 mg in sodium chloride 0.9 % 500 mL irrigation        80 mg Irrigation On call 08/30/23 0950 08/31/23 1300   08/30/23 1300  ceFAZolin (ANCEF) IVPB 2g/100 mL premix        2 g 200 mL/hr over 30 Minutes Intravenous On call 08/30/23 0949 08/30/23 1430   08/26/23 1200  cefTRIAXone (ROCEPHIN) 2 g in sodium chloride 0.9 % 100 mL IVPB        2 g 200 mL/hr over 30 Minutes Intravenous Every 24 hours 08/26/23 0738 08/30/23 1439   08/26/23 1000  piperacillin-tazobactam (ZOSYN) IVPB 3.375 g  Status:  Discontinued        3.375 g 12.5 mL/hr over 240 Minutes Intravenous  Every 8 hours 08/26/23 0252 08/26/23 0738   08/26/23 0300  piperacillin-tazobactam (ZOSYN) IVPB 3.375 g        3.375 g 100 mL/hr over 30 Minutes Intravenous  Once 08/26/23 0252 08/26/23 0447       Subjective: Seen and examined at bedside he is doing okay.  No nausea or vomiting.  Resting comfortably.  States his chest is a little sore.  No other concerns or complaints at this time.  Objective: Vitals:   08/29/23 2022 08/29/23 2333 08/30/23 0414 08/30/23 0753  BP: (!) 154/94 110/76 135/73 123/78  Pulse: 98 71 82 84  Resp: 18 16 16 16   Temp: 98.6 F (37 C) 98.3 F (36.8 C) 98.5 F (36.9 C) 98.7 F (37.1 C)  TempSrc: Oral Oral Oral Oral  SpO2: 97% 99% 91% 97%  Weight:   100.3 kg   Height:        Intake/Output Summary (Last 24 hours) at 08/30/2023 1549 Last data filed at 08/30/2023 0419 Gross per 24 hour  Intake 271.85 ml  Output 2275 ml  Net -2003.15 ml   [employer]  08/28/23 0500 08/29/23 0500 08/30/23 0414  Weight: 101.1 kg 102.2 kg 100.3 kg   Examination: Physical Exam:  Constitutional: WN/WD overweight chronically ill-appearing African-American male in no acute distress Respiratory: Diminished to auscultation bilaterally with some coarse breath sounds, no wheezing, rales, rhonchi or crackles. Normal respiratory effort and patient is not tachypenic. No accessory muscle use.  Unlabored breathing but is wearing supplemental oxygen via nasal cannula Cardiovascular: RRR, no murmurs / rubs / gallops. S1 and S2 auscultated.  Trace extremity edema Abdomen: Soft, non-tender, distended secondary to body habitus. Bowel sounds positive.  GU: Deferred. Musculoskeletal: No clubbing / cyanosis of digits/nails. No joint deformity upper and lower extremities.  Skin: No rashes, lesions, ulcers on limited skin evaluation. No induration; Warm and dry.  Neurologic: CN 2-12 grossly intact with no focal deficits.  Romberg sign and cerebellar reflexes not assessed.  Psychiatric: Normal  judgment and insight. Alert and oriented x 3. Normal mood and appropriate affect.   Data Reviewed: I have personally reviewed following labs and imaging studies  CBC: Recent Labs  Lab 08/26/23 0434 08/26/23 0439 08/27/23 0516 08/27/23 0520 08/28/23 0104 08/29/23 0544 08/30/23 0549  WBC 16.2*  --  27.1*  --  23.9* 18.4* 12.7*  HGB 15.3   < > 14.1 14.3 13.2 13.1 13.5  HCT 46.3   < > 40.1 42.0 38.7* 39.0 39.7  MCV 85.4  --  82.5  --  84.3 85.7 84.8  PLT 254  --  235  --  179 180 173   < > = values in this interval not displayed.   Basic Metabolic Panel: Recent Labs  Lab 08/26/23 0434 08/26/23 0439 08/27/23 0516 08/27/23 0520 08/28/23 0104 08/29/23 0544 08/30/23 0549  NA 134*   < > 135 135 134* 134* 136  K 4.7   < > 4.1 4.0 4.1 4.3 4.1  CL 105  --  106  --  102 97* 98  CO2 17*  --  19*  --  22 26 28   GLUCOSE 283*  --  186*  --  252* 143* 149*  BUN 11  --  16  --  26* 18 16  CREATININE 1.16  --  1.30*  --  1.33* 0.93 1.00  CALCIUM 7.6*  --  8.4*  --  8.2* 8.2* 8.4*  MG 2.0  --  1.8  --  2.2 1.9 2.0  PHOS 3.6  --   --   --   --   --   --    < > = values in this interval not displayed.   GFR: Estimated Creatinine Clearance: 108.9 mL/min (by C-G formula based on SCr of 1 mg/dL). Liver Function Tests: Recent Labs  Lab 08/25/23 1312 08/26/23 0120 08/26/23 0719 08/27/23 0516  AST 27 290* 228* 95*  ALT 22 242* 200* 133*  ALKPHOS 73 105 85 65  BILITOT 0.6 0.6 1.2 0.5  PROT 6.6 6.1* 5.9* 5.4*  ALBUMIN 3.8 3.3* 3.2* 2.9*   No results for input(s): "LIPASE", "AMYLASE" in the last 168 hours. No results for input(s): "AMMONIA" in the last 168 hours. Coagulation Profile: Recent Labs  Lab 08/26/23 0120  INR 1.1   Cardiac Enzymes: Recent Labs  Lab 08/26/23 0719  CKTOTAL 1,253*   BNP (last 3 results) No results for input(s): "PROBNP" in the last 8760 hours. HbA1C: No results for input(s): "HGBA1C" in the last 72 hours. CBG: Recent Labs  Lab 08/29/23 2038  08/29/23 2337 08/30/23 0430 08/30/23 6045 08/30/23 1207  GLUCAP 134* 89 122* 105* 117*   Lipid Profile: No results for input(s): "CHOL", "HDL", "LDLCALC", "TRIG", "CHOLHDL", "LDLDIRECT" in the last 72 hours. Thyroid Function Tests: No results for input(s): "TSH", "T4TOTAL", "FREET4", "T3FREE", "THYROIDAB" in the last 72 hours. Anemia Panel: No results for input(s): "VITAMINB12", "FOLATE", "FERRITIN", "TIBC", "IRON", "RETICCTPCT" in the last 72 hours. Sepsis Labs: Recent Labs  Lab 08/26/23 0122 08/26/23 0434 08/26/23 0719 08/26/23 0724 08/26/23 2146  PROCALCITON  --   --  21.78  --   --   LATICACIDVEN 5.5* 2.2*  --  4.1* 3.6*   Recent Results (from the past 240 hours)  Resp panel by RT-PCR (RSV, Flu A&B, Covid) Anterior Nasal Swab     Status: None   Collection Time: 08/25/23  1:12 PM   Specimen: Anterior Nasal Swab  Result Value Ref Range Status   SARS Coronavirus 2 by RT PCR NEGATIVE NEGATIVE Final   Influenza A by PCR NEGATIVE NEGATIVE Final   Influenza B by PCR NEGATIVE NEGATIVE Final    Comment: (NOTE) The Xpert Xpress SARS-CoV-2/FLU/RSV plus assay is intended as an aid in the diagnosis of influenza from Nasopharyngeal swab specimens and should not be used as a sole basis for treatment. Nasal washings and aspirates are unacceptable for Xpert Xpress SARS-CoV-2/FLU/RSV testing.  Fact Sheet for Patients: BloggerCourse.com  Fact Sheet for Healthcare Providers: SeriousBroker.it  This test is not yet approved or cleared by the [place] FDA and has been authorized for detection and/or diagnosis of SARS-CoV-2 by FDA under an Emergency Use Authorization (EUA). This EUA will remain in effect (meaning this test can be used) for the duration of the COVID-19 declaration under Section 564(b)(1) of the Act, 21 U.S.C. section 360bbb-3(b)(1), unless the authorization is terminated or revoked.     Resp Syncytial Virus by PCR  NEGATIVE NEGATIVE Final    Comment: (NOTE) Fact Sheet for Patients: BloggerCourse.com  Fact Sheet for Healthcare Providers: SeriousBroker.it  This test is not yet approved or cleared by the [place] FDA and has been authorized for detection and/or diagnosis of SARS-CoV-2 by FDA under an Emergency Use Authorization (EUA). This EUA will remain in effect (meaning this test can be used) for the duration of the COVID-19 declaration under Section 564(b)(1) of the Act, 21 U.S.C. section 360bbb-3(b)(1), unless the authorization is terminated or revoked.  Performed at [facility], [address] [place], [place] [address]   MRSA Next Gen by PCR, Nasal     Status: None   Collection Time: 08/26/23  3:18 AM   Specimen: Nasal Mucosa; Nasal Swab  Result Value Ref Range Status   MRSA by PCR Next Gen NOT DETECTED NOT DETECTED Final    Comment: (NOTE) The GeneXpert MRSA Assay (FDA approved for NASAL specimens only), is one component of a comprehensive MRSA colonization surveillance program. It is not intended to diagnose MRSA infection nor to guide or monitor treatment for MRSA infections. Test performance is not FDA approved in patients less than 20 years old. Performed at [facility], [address], [place], [place] [address]   Culture, Respiratory w Gram Stain     Status: None   Collection Time: 08/26/23  3:20 AM   Specimen: Tracheal Aspirate; Respiratory  Result Value Ref Range Status   Specimen Description TRACHEAL ASPIRATE  Final   Special Requests NONE  Final   Gram Stain   Final    ABUNDANT WBC PRESENT, PREDOMINANTLY PMN FEW GRAM POSITIVE COCCI IN PAIRS IN  CHAINS    Culture   Final    MODERATE Normal respiratory flora-no Staph aureus or Pseudomonas seen Performed at [facility], [address], [place], [place] [address]    Report Status 08/28/2023 FINAL  Final  Culture, blood (Routine X 2) w Reflex to  ID Panel     Status: None (Preliminary result)   Collection Time: 08/26/23  5:32 AM   Specimen: BLOOD RIGHT HAND  Result Value Ref Range Status   Specimen Description BLOOD RIGHT HAND  Final   Special Requests   Final    BOTTLES DRAWN AEROBIC AND ANAEROBIC Blood Culture results may not be optimal due to an inadequate volume of blood received in culture bottles   Culture   Final    NO GROWTH 4 DAYS Performed at [facility], [address], [place], [place] [address]    Report Status PENDING  Incomplete  Culture, blood (Routine X 2) w Reflex to ID Panel     Status: None (Preliminary result)   Collection Time: 08/26/23  5:35 AM   Specimen: BLOOD RIGHT ARM  Result Value Ref Range Status   Specimen Description BLOOD RIGHT ARM  Final   Special Requests   Final    BOTTLES DRAWN AEROBIC ONLY Blood Culture results may not be optimal due to an inadequate volume of blood received in culture bottles   Culture   Final    NO GROWTH 4 DAYS Performed at [facility], [address], [place], [place] [address]    Report Status PENDING  Incomplete    Radiology Studies: MR CARDIAC MORPHOLOGY W WO CONTRAST Result Date: 08/29/2023 CLINICAL DATA:  Clinical question of VF arrest Study assumes BSA of 2.29 m2. EXAM: CARDIAC MRI TECHNIQUE: The patient was scanned on a 1.5 Tesla GE magnet. A dedicated cardiac coil was used. Functional imaging was done using Fiesta sequences. 2,3, and 4 chamber views were done to assess for RWMA's. Modified Simpson's rule using a short axis stack was used to calculate an ejection fraction on a dedicated work [employer]. The patient received 10 cc of Gadavist. After 10 minutes inversion recovery sequences were used to assess for infiltration and scar tissue. Flow quantification was performed 2 times during this examination with flow quantification performed at the levels of the ascending aorta above the valve, pulmonary artery above the valve. CONTRAST:   10 cc  of Gadavist FINDINGS: 1. Normal left ventricular size, with LVEDD 52 mm, and LVEDVi 67 mL/m2. Normal left ventricular thickness. Mild to moderate decrease in left ventricular systolic function (LVEF =40%). There are no regional wall motion abnormalities, but global hypokinesis. Normal resting perfusion. Left ventricular parametric mapping notable for normal T2 and ECV. There is no late gadolinium enhancement in the left ventricular myocardium. There is no evidence of LV thrombus. 2. Normal right ventricular size with RVEDVI 69 mL/m2. Normal right ventricular thickness. Mild decrease in right ventricular systolic function (RVEF =45%). There is septal and apical hypokinesis with preserved planar excursion. 3.  Normal left and right atrial size. 4. Normal size of the aortic root, ascending aorta and pulmonary artery. 5. Valve assessment: Aortic Valve: Tri-leaflet aortic valve. Qualitatively, there is no significant regurgitation. Regurgitant fraction <1%. Pulmonic Valve: Qualitatively, there is no significant regurgitation. Regurgitant fraction < 1%. Tricuspid Valve: Qualitatively, there is no significant regurgitation. Regurgitant fraction 1%. Mitral Valve: Qualitatively, there is no significant regurgitation. Regurgitant fraction 1%. 6.  Normal pericardium.  No pericardial effusion.  7. Bilateral dependent ground glass opacities and consolidation. Recommended dedicated study if concerned for non-cardiac pathology. 8. Breath hold artifacts noted. Study was performed free breathing. This decreased the sensitivity of volumetric assessments. IMPRESSION: 1. Mild to moderate decrease in left ventricular systolic function (LVEF =40%). 2. There is no late gadolinium enhancement in the left ventricular myocardium. Normal parametric mapping and tissue characterization. 3. Mild decrease in right ventricular systolic function (RVEF =45%). Riley Lam MD Electronically Signed   By: Riley Lam M.D.   On:  08/29/2023 18:37   MR CARDIAC VELOCITY FLOW MAP Result Date: 08/29/2023 CLINICAL DATA:  Clinical question of VF arrest Study assumes BSA of 2.29 m2. EXAM: CARDIAC MRI TECHNIQUE: The patient was scanned on a 1.5 Tesla GE magnet. A dedicated cardiac coil was used. Functional imaging was done using Fiesta sequences. 2,3, and 4 chamber views were done to assess for RWMA's. Modified Simpson's rule using a short axis stack was used to calculate an ejection fraction on a dedicated work [employer], [employer]. The patient received 10 cc of Gadavist. After 10 minutes inversion recovery sequences were used to assess for infiltration and scar tissue. Flow quantification was performed 2 times during this examination with flow quantification performed at the levels of the ascending aorta above the valve, pulmonary artery above the valve. CONTRAST:  10 cc  of Gadavist FINDINGS: 1. Normal left ventricular size, with LVEDD 52 mm, and LVEDVi 67 mL/m2. Normal left ventricular thickness. Mild to moderate decrease in left ventricular systolic function (LVEF =40%). There are no regional wall motion abnormalities, but global hypokinesis. Normal resting perfusion. Left ventricular parametric mapping notable for normal T2 and ECV. There is no late gadolinium enhancement in the left ventricular myocardium. There is no evidence of LV thrombus. 2. Normal right ventricular size with RVEDVI 69 mL/m2. Normal right ventricular thickness. Mild decrease in right ventricular systolic function (RVEF =45%). There is septal and apical hypokinesis with preserved planar excursion. 3.  Normal left and right atrial size. 4. Normal size of the aortic root, ascending aorta and pulmonary artery. 5. Valve assessment: Aortic Valve: Tri-leaflet aortic valve. Qualitatively, there is no significant regurgitation. Regurgitant fraction <1%. Pulmonic Valve: Qualitatively, there is no significant regurgitation. Regurgitant fraction < 1%. Tricuspid Valve:  Qualitatively, there is no significant regurgitation. Regurgitant fraction 1%. Mitral Valve: Qualitatively, there is no significant regurgitation. Regurgitant fraction 1%. 6.  Normal pericardium.  No pericardial effusion. 7. Bilateral dependent ground glass opacities and consolidation. Recommended dedicated study if concerned for non-cardiac pathology. 8. Breath hold artifacts noted. Study was performed free breathing. This decreased the sensitivity of volumetric assessments. IMPRESSION: 1. Mild to moderate decrease in left ventricular systolic function (LVEF =40%). 2. There is no late gadolinium enhancement in the left ventricular myocardium. Normal parametric mapping and tissue characterization. 3. Mild decrease in right ventricular systolic function (RVEF =45%). Riley Lam MD Electronically Signed   By: Riley Lam M.D.   On: 08/29/2023 18:37   MR CARDIAC VELOCITY FLOW MAP Result Date: 08/29/2023 CLINICAL DATA:  Clinical question of VF arrest Study assumes BSA of 2.29 m2. EXAM: CARDIAC MRI TECHNIQUE: The patient was scanned on a 1.5 Tesla GE magnet. A dedicated cardiac coil was used. Functional imaging was done using Fiesta sequences. 2,3, and 4 chamber views were done to assess for RWMA's. Modified Simpson's rule using a short axis stack was used to calculate an ejection fraction on a dedicated work [employer], [employer]. The patient received 10 cc of  Gadavist. After 10 minutes inversion recovery sequences were used to assess for infiltration and scar tissue. Flow quantification was performed 2 times during this examination with flow quantification performed at the levels of the ascending aorta above the valve, pulmonary artery above the valve. CONTRAST:  10 cc  of Gadavist FINDINGS: 1. Normal left ventricular size, with LVEDD 52 mm, and LVEDVi 67 mL/m2. Normal left ventricular thickness. Mild to moderate decrease in left ventricular systolic function (LVEF =40%). There are no  regional wall motion abnormalities, but global hypokinesis. Normal resting perfusion. Left ventricular parametric mapping notable for normal T2 and ECV. There is no late gadolinium enhancement in the left ventricular myocardium. There is no evidence of LV thrombus. 2. Normal right ventricular size with RVEDVI 69 mL/m2. Normal right ventricular thickness. Mild decrease in right ventricular systolic function (RVEF =45%). There is septal and apical hypokinesis with preserved planar excursion. 3.  Normal left and right atrial size. 4. Normal size of the aortic root, ascending aorta and pulmonary artery. 5. Valve assessment: Aortic Valve: Tri-leaflet aortic valve. Qualitatively, there is no significant regurgitation. Regurgitant fraction <1%. Pulmonic Valve: Qualitatively, there is no significant regurgitation. Regurgitant fraction < 1%. Tricuspid Valve: Qualitatively, there is no significant regurgitation. Regurgitant fraction 1%. Mitral Valve: Qualitatively, there is no significant regurgitation. Regurgitant fraction 1%. 6.  Normal pericardium.  No pericardial effusion. 7. Bilateral dependent ground glass opacities and consolidation. Recommended dedicated study if concerned for non-cardiac pathology. 8. Breath hold artifacts noted. Study was performed free breathing. This decreased the sensitivity of volumetric assessments. IMPRESSION: 1. Mild to moderate decrease in left ventricular systolic function (LVEF =40%). 2. There is no late gadolinium enhancement in the left ventricular myocardium. Normal parametric mapping and tissue characterization. 3. Mild decrease in right ventricular systolic function (RVEF =45%). Riley Lam MD Electronically Signed   By: Riley Lam M.D.   On: 08/29/2023 18:37   Scheduled Meds:  chlorhexidine  60 mL Topical Once   Chlorhexidine Gluconate Cloth  6 each Topical Daily   dalfampridine  10 mg Oral BID   diphenhydrAMINE  25 mg Intravenous Once   gentamicin  (GARAMYCIN) 80 mg in sodium chloride 0.9 % 500 mL irrigation  80 mg Irrigation On Call   haloperidol lactate  5 mg Intravenous Once   heparin injection (subcutaneous)  5,000 Units Subcutaneous Q8H   insulin aspart  0-15 Units Subcutaneous Q4H   insulin glargine-yfgn  35 Units Subcutaneous BID   levothyroxine  125 mcg Oral Q0600   polyethylene glycol  17 g Oral BID   rOPINIRole  0.25 mg Oral QHS   sacubitril-valsartan  1 tablet Oral BID   senna-docusate  2 tablet Oral BID   sodium chloride flush  3-10 mL Intravenous Q12H   [START ON 08/31/2023] spironolactone  25 mg Oral Daily   Continuous Infusions:  sodium chloride 50 mL/hr at 08/30/23 1055   amiodarone 30 mg/hr (08/29/23 2315)    LOS: 4 days   Marguerita Merles, DO [facility] Available via Epic secure chat 7am-7pm After these hours, please refer to coverage provider listed on [email or web address] 08/30/2023, 3:49 PM
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
## 2023-08-30 NOTE — Progress Notes (Signed)
 Advanced Heart Failure Rounding Note  Cardiologist: Dr. Gasper Lloyd (new)  Chief Complaint: Out of hospital VF Arrest   Patient Profile   Johnathan Ross is a 53 y.o. male with a hx of DM1, hypothyroidism, HTN, multiple sclerosis, hyperlipidemia admitted for OOH VT/VF arrest.   Pertinent Data:   Shocked 8 times, given epinephrine x2 and amiodarone. Intubated.   Initial K 3.3, Mg 2.0  Hs trop 290>>150 LA 5.5>>2.2 >>4.1 Post arrest EKG NSR w/ low voltage and short P-R interval. No STE. QTc ok 367 ms. UDS + Benzos  Echo EF 30-35%, no LVH. RV mildly reduced  R/LHC: Mild nonobstructive CAD, severely elevated pre capillary filling pressures. Hemodynamics (PAPi, CVP/PCWP ratio) consistent with suboptimal RV function >>started on Milrinone 0.25 mcg/kg/min Ost LAD to Prox LAD lesion is 30% stenosed.   HEMODYNAMICS:. RA:                  12 mmHg (mean) RV:                  35/10-14 mmHg PA:                  38/20 mmHg (26 mean) PCWP:            14 mmHg (mean)                                      Estimated Fick CO/CI: 4.3 L/min, 1.94 L/min/m2   Thermodilution CO/CI: 4.39 L/min, 1.98 L/min/m2                                           TPG                 12  mmHg                                            PVR                 2.8 Wood Units  PAPi                1.5     LVEDP: 18  EEG 3/31: moderate to severe diffuse encephalopathy. No seizures or epileptiform discharges   Subjective:    CVP low this morning, coox 70 off of milrinone and on entresto. BP much better controlled this morning. Plan to go to ICD implant today, likely discharge to CIR following.    Objective:   Weight Range: 100.3 kg Body mass index is 29.17 kg/m.   Vital Signs:   Temp:  [98.1 F (36.7 C)-98.7 F (37.1 C)] 98.7 F (37.1 C) (04/04 0753) Pulse Rate:  [71-98] 84 (04/04 0753) Resp:  [16-19] 16 (04/04 0753) BP: (110-154)/(73-94) 123/78 (04/04 0753) SpO2:  [91 %-99 %] 97 % (04/04 0753) Weight:   [100.3 kg] 100.3 kg (04/04 0414) Last BM Date : 08/29/23  Weight change: Filed Weights   08/28/23 0500 08/29/23 0500 08/30/23 0414  Weight: 101.1 kg 102.2 kg 100.3 kg    Intake/Output:   Intake/Output Summary (Last 24 hours) at 08/30/2023 1351 Last data filed at 08/30/2023 0419 Gross per 24 hour  Intake 316.63 ml  Output 2275 ml  Net -1958.37 ml      Physical Exam    CVP 7  General:  Overall well appearing Cor: RRR, no m/g/r Lungs: clear Abdomen: soft nontender, nondistended Extremities: no cyanosis, clubbing, rash, edema Neuro: alert & oriented x 3, cranial nerves grossly intact. moves all 4 extremities w/o difficulty. Affect pleasant.   Telemetry   NSR   Labs    CBC Recent Labs    08/29/23 0544 08/30/23 0549  WBC 18.4* 12.7*  HGB 13.1 13.5  HCT 39.0 39.7  MCV 85.7 84.8  PLT 180 173   Basic Metabolic Panel Recent Labs    29/56/21 0544 08/30/23 0549  NA 134* 136  K 4.3 4.1  CL 97* 98  CO2 26 28  GLUCOSE 143* 149*  BUN 18 16  CREATININE 0.93 1.00  CALCIUM 8.2* 8.4*  MG 1.9 2.0   Liver Function Tests No results for input(s): "AST", "ALT", "ALKPHOS", "BILITOT", "PROT", "ALBUMIN" in the last 72 hours.  No results for input(s): "LIPASE", "AMYLASE" in the last 72 hours. Cardiac Enzymes No results for input(s): "CKTOTAL", "CKMB", "CKMBINDEX", "TROPONINI" in the last 72 hours.   BNP: BNP (last 3 results) Recent Labs    08/26/23 0645  BNP 102.9*    ProBNP (last 3 results) No results for input(s): "PROBNP" in the last 8760 hours.   D-Dimer No results for input(s): "DDIMER" in the last 72 hours.  Hemoglobin A1C No results for input(s): "HGBA1C" in the last 72 hours. Fasting Lipid Panel No results for input(s): "CHOL", "HDL", "LDLCALC", "TRIG", "CHOLHDL", "LDLDIRECT" in the last 72 hours.  Thyroid Function Tests No results for input(s): "TSH", "T4TOTAL", "T3FREE", "THYROIDAB" in the last 72 hours.  Invalid input(s): "FREET3"   Other  results:    Medications:     Scheduled Medications:  chlorhexidine  60 mL Topical Once   Chlorhexidine Gluconate Cloth  6 each Topical Daily   dalfampridine  10 mg Oral BID   diphenhydrAMINE  25 mg Intravenous Once   gentamicin (GARAMYCIN) 80 mg in sodium chloride 0.9 % 500 mL irrigation  80 mg Irrigation On Call   haloperidol lactate  5 mg Intravenous Once   heparin injection (subcutaneous)  5,000 Units Subcutaneous Q8H   insulin aspart  0-15 Units Subcutaneous Q4H   insulin glargine-yfgn  35 Units Subcutaneous BID   levothyroxine  125 mcg Oral Q0600   methylPREDNISolone sodium succinate  125 mg Intravenous Once   polyethylene glycol  17 g Oral BID   rOPINIRole  0.25 mg Oral QHS   sacubitril-valsartan  1 tablet Oral BID   senna-docusate  2 tablet Oral BID   sodium chloride flush  3-10 mL Intravenous Q12H   spironolactone  12.5 mg Oral Daily    Infusions:  sodium chloride 50 mL/hr at 08/30/23 1055   amiodarone 30 mg/hr (08/29/23 2315)   cefTRIAXone (ROCEPHIN)  IV Stopped (08/29/23 1301)    PRN Medications: diazepam, fentaNYL, gadobutrol, hydrALAZINE, melatonin, ondansetron (ZOFRAN) IV, mouth rinse, sodium chloride flush     Assessment/Plan   1. OOH VT/VF Arrest - ~ 20 min bystander CPR. ROSC after multiple defibs, Epi + amio  - no STE on post arrest EKG. Noted short PR interval, low voltage. QTc 367 ms - Echo EF 30-35%, no LVH. RV mildly reduced  - R/LHC nonobs CAD - Continue amio gtt, defer to EP but can likely transition to orals soon - Keep K > 4.0, Mg > 2.0  - MRI completed, no evidence  of LGE    2. 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 RV mildly reduced. No prior study for comparison  - uncertain if baseline CM that triggered VT/VF vs cardiac stunning post arrest  - R/LHC nonobs CAD, suboptimal RV fx w/ low output (RA 12, PAP 38/20, PCW 14 CI 1.9) - Entresto titrated up to 49/51mg  BID -  Increase spironolactone to 25mg  daily  - Off milrinone, coox stable - Euvolemic on exam, no further diuretics - CMR reviewed, no evidence of LGE, hopefully dysfunction post arrest stunning  - ICD prior to discharge - SGLT-2 after he undergoes ICD   3. Vasoplegic/ Septic Shock - shock resolved. BP stable off pressors  - initial PCT 21, WBCs downtrending  - Short course of abx completed - Bcx NGTD   4. Acute Hypoxic Respiratory Failure - Improved  After ICD implant likely stable for CIR. Will need amiodarone transition to orals    Length of Stay: 4  Romie Minus, MD  08/30/2023, 1:51 PM  Advanced Heart Failure Team Pager [phone] (M-F; 7a - 5p)  Please contact [facility] for night-coverage after hours (5p -7a ) and weekends on [email or web address]

## 2023-08-31 ENCOUNTER — Inpatient Hospital Stay (HOSPITAL_COMMUNITY)

## 2023-08-31 DIAGNOSIS — R579 Shock, unspecified: Secondary | ICD-10-CM | POA: Diagnosis not present

## 2023-08-31 DIAGNOSIS — I469 Cardiac arrest, cause unspecified: Secondary | ICD-10-CM | POA: Diagnosis not present

## 2023-08-31 DIAGNOSIS — D72829 Elevated white blood cell count, unspecified: Secondary | ICD-10-CM | POA: Diagnosis not present

## 2023-08-31 DIAGNOSIS — I5082 Biventricular heart failure: Secondary | ICD-10-CM | POA: Diagnosis not present

## 2023-08-31 LAB — CBC WITH DIFFERENTIAL/PLATELET
Abs Immature Granulocytes: 0.17 10*3/uL — ABNORMAL HIGH (ref 0.00–0.07)
Basophils Absolute: 0 10*3/uL (ref 0.0–0.1)
Basophils Relative: 0 %
Eosinophils Absolute: 0 10*3/uL (ref 0.0–0.5)
Eosinophils Relative: 0 %
HCT: 40 % (ref 39.0–52.0)
Hemoglobin: 13.7 g/dL (ref 13.0–17.0)
Immature Granulocytes: 1 %
Lymphocytes Relative: 4 %
Lymphs Abs: 0.7 10*3/uL (ref 0.7–4.0)
MCH: 28.7 pg (ref 26.0–34.0)
MCHC: 34.3 g/dL (ref 30.0–36.0)
MCV: 83.9 fL (ref 80.0–100.0)
Monocytes Absolute: 0.9 10*3/uL (ref 0.1–1.0)
Monocytes Relative: 5 %
Neutro Abs: 15.9 10*3/uL — ABNORMAL HIGH (ref 1.7–7.7)
Neutrophils Relative %: 90 %
Platelets: 219 10*3/uL (ref 150–400)
RBC: 4.77 MIL/uL (ref 4.22–5.81)
RDW: 13.3 % (ref 11.5–15.5)
WBC: 17.6 10*3/uL — ABNORMAL HIGH (ref 4.0–10.5)
nRBC: 0 % (ref 0.0–0.2)

## 2023-08-31 LAB — COMPREHENSIVE METABOLIC PANEL WITH GFR
ALT: 52 U/L — ABNORMAL HIGH (ref 0–44)
AST: 45 U/L — ABNORMAL HIGH (ref 15–41)
Albumin: 2.7 g/dL — ABNORMAL LOW (ref 3.5–5.0)
Alkaline Phosphatase: 84 U/L (ref 38–126)
Anion gap: 8 (ref 5–15)
BUN: 26 mg/dL — ABNORMAL HIGH (ref 6–20)
CO2: 27 mmol/L (ref 22–32)
Calcium: 8.8 mg/dL — ABNORMAL LOW (ref 8.9–10.3)
Chloride: 101 mmol/L (ref 98–111)
Creatinine, Ser: 1.32 mg/dL — ABNORMAL HIGH (ref 0.61–1.24)
GFR, Estimated: >60 mL/min (ref >60)
Glucose, Bld: 236 mg/dL — ABNORMAL HIGH (ref 70–99)
Potassium: 4.4 mmol/L (ref 3.5–5.1)
Sodium: 136 mmol/L (ref 135–145)
Total Bilirubin: 0.5 mg/dL (ref 0.0–1.2)
Total Protein: 5.9 g/dL — ABNORMAL LOW (ref 6.5–8.1)

## 2023-08-31 LAB — CULTURE, BLOOD (ROUTINE X 2)
Culture: NO GROWTH
Culture: NO GROWTH

## 2023-08-31 LAB — GLUCOSE, CAPILLARY
Glucose-Capillary: 119 mg/dL — ABNORMAL HIGH (ref 70–99)
Glucose-Capillary: 225 mg/dL — ABNORMAL HIGH (ref 70–99)
Glucose-Capillary: 231 mg/dL — ABNORMAL HIGH (ref 70–99)
Glucose-Capillary: 232 mg/dL — ABNORMAL HIGH (ref 70–99)
Glucose-Capillary: 287 mg/dL — ABNORMAL HIGH (ref 70–99)
Glucose-Capillary: 376 mg/dL — ABNORMAL HIGH (ref 70–99)
Glucose-Capillary: 416 mg/dL — ABNORMAL HIGH (ref 70–99)

## 2023-08-31 LAB — MAGNESIUM: Magnesium: 2.1 mg/dL (ref 1.7–2.4)

## 2023-08-31 LAB — PHOSPHORUS: Phosphorus: 3.3 mg/dL (ref 2.5–4.6)

## 2023-08-31 MED ORDER — INSULIN ASPART 100 UNIT/ML IJ SOLN
15.0000 [IU] | Freq: Once | INTRAMUSCULAR | Status: AC
  Administered 2023-08-31: 15 [IU] via SUBCUTANEOUS

## 2023-08-31 MED ORDER — AMIODARONE HCL 200 MG PO TABS
400.0000 mg | ORAL_TABLET | Freq: Every day | ORAL | Status: DC
  Administered 2023-08-31 – 2023-09-03 (×4): 400 mg via ORAL
  Filled 2023-08-31 (×4): qty 2

## 2023-08-31 MED ORDER — INSULIN ASPART 100 UNIT/ML IJ SOLN
0.0000 [IU] | INTRAMUSCULAR | Status: DC
  Administered 2023-08-31: 7 [IU] via SUBCUTANEOUS
  Administered 2023-08-31: 20 [IU] via SUBCUTANEOUS
  Administered 2023-08-31: 11 [IU] via SUBCUTANEOUS

## 2023-08-31 NOTE — Inpatient Diabetes Management (Signed)
 Inpatient Diabetes Program Recommendations  AACE/ADA: New Consensus Statement on Inpatient Glycemic Control (2015)  Target Ranges:  Prepandial:   less than 140 mg/dL      Peak postprandial:   less than 180 mg/dL (1-2 hours)      Critically ill patients:  140 - 180 mg/dL   Lab Results  Component Value Date   GLUCAP 376 (H) 08/31/2023   HGBA1C 7.3 (H) 07/08/2023    Latest Reference Range & Units 08/30/23 22:15 08/31/23 00:15 08/31/23 04:07 08/31/23 07:31 08/31/23 11:35  Glucose-Capillary 70 - 99 mg/dL 409 (H) 811 (H) 914 (H) 232 (H) 376 (H)  (H): Data is abnormally high  Inpatient Diabetes Program Recommendations:   Patient received solumedrol 125 mg yesterday and now CBGs elevated. Please consider: -Increase Novolog correction to 0-20 units q 4 hrs. Until CBGs improved <180.  Thank you, Billy Fischer. Nolah Krenzer, RN, MSN, CDCES  Diabetes Coordinator Inpatient Glycemic Control Team Team Pager [phone] (8am-5pm) 08/31/2023 11:52 AM

## 2023-08-31 NOTE — Progress Notes (Signed)
 PROGRESS NOTE    Johnathan Ross  ZOX:[id number] DOB: 1970/08/25 DOA: 08/26/2023 PCP: Etta Grandchild, MD   Brief Narrative:  The patient is a 53 year old overweight African-American male with a past medical history significant for relapsing remitting MS on [place], hyperlipidemia, hypertension, hypothyroidism, diabetes mellitus type 1 as well as other comorbidities who presented to the ED after collapsing requiring bystander CPR and is found to be in V-fib V. tach arrest.  CPR lasted about 20 minutes and he was received shocked 8 times and epi x 2 and amiodarone.  He is brought to the ED no is normal sinus rhythm and required peripheral neuropathy.  No PE was done and is admitted to the critical care service.  Cardiology evaluated and patient was found to have a mixed cardiogenic and septic shock on admission which is now improved.  Further workup also done and showed acute biventricular systolic CHF with right ventricular dominant shock.  EP and advanced heart failure team consulted.  He was transferred to [facility] service on 08/30/2023.  He underwent further workup and is s/p ICD placement and now being transitioned from IV Amiodarone to oral Amiodarone today.  Assessment and Plan:  Witnessed out of hospital Vfib/VTach arrest x 20 min intermittent-ROSC after multiple defibrillations and bystander CPR" had received epi and amnio trops flat, EF down to 30-35%, query primary arrythmia and new cardiomyopathy; On Amio as on Milrinone gtt per AHF team along with Diuresis and GDMT. Cardiac MRI done and getting showed no evidence of LGE as below and also did show mild to moderate decrease in left ventricular systolic function of 40% and the mild decrease in right ventricular systolic function with RV of 45%. He is s/p ICD placement and being transitioned from IV Amiodarone to oral 400 mg po Daily  PT OT recommending acute inpatient rehabilitation and pending placement  Mix cardiogenic and septic (aspiration),  improved: Septic shock is resolved and he is blood pressure stable off pressors.  WBC continue to trend and he has received his short course of antibiotics and completed with IV ceftriaxone.  Blood cultures showing no growth to date PCT was 21.78 and repeat in the morning  Acute biventricular Systolic Heart Failure with Right Ventricular dominant shock Further care per cardiology.  BNP was 102.9 on admission he is on now on Entresto 49/51 twice daily, spironolactone 12 5 mg p.o. daily, off of milrinone is Co-Ox stable.  Central line has been removed.  No evidence of late gadolinium enhancement in the left ventricular myocardium and had normal parametric mapping and tissue characterization on the cardiac MRI and the cardiologist feels that it is hopefully dysfunction postarrest stunning.  Received ICD 08/30/23. Will be initiated on SGLT2 inhibitor after he undergoes ICD.  Post arrest Encephalopathy: Some lingering delirium but overall improved and appears baseline; has haloperidol 5 mg as needed  Post arrest resp failure with probable aspiration: improving, some lingering hypoxemia; doing well with IS. SpO2: 94 % O2 Flow Rate (L/min): 2 L/min, FiO2 (%): 40 %. CXR this AM showed that the single lead AICD was in expected position and there was no pneumothorax visualized with decreased pulmonary interstitial edema. Repeat CXR in the AM  History of HTN, HLD: Further care per Cardiology  Type 1 DM: Off of insulin drip and he is now on Semglee 35 units twice daily.  Increased Moderate NovoLog/scale insulin every 4 to Resistant  Multiple Sclerosis: C/w Dalfampridine TB12 10 mg po BID  Hypothyroidism: TSH was 2.213, continue Levothyroxine  125 mcg p.o. daily  RLS type symptoms- related to MS, some breakthrough, now on ropinirole 0.25 mg p.o. nightly  Leukocytosis: Improving but worsened in the setting of Steroid Demargination (received Solumedrol w/ Pacer placement). WBC went from 27.1 -> 23.9 -> 18.4 ->  12.7 -> 17.6. CTM and Trend and Repeat CBC in the AM   AKI on CKD Stage 3a: BUN/Cr Trend: Recent Labs  Lab 08/26/23 0121 08/26/23 0434 08/27/23 0516 08/28/23 0104 08/29/23 0544 08/30/23 0549 08/31/23 0503  BUN 11 11 16  26* 18 16 26*  CREATININE 1.40* 1.16 1.30* 1.33* 0.93 1.00 1.32*  -Avoid Nephrotoxic Medications, Contrast Dyes, Hypotension and Dehydration to Ensure Adequate Renal Perfusion and will need to Renally Adjust Meds -Continue to Monitor and Trend Renal Function carefully and repeat CMP in the AM   Abnormal LFTs and Acute Liver Injury in the setting Shock and Cardiac Arrest: LFT Trend, improving: Recent Labs  Lab 08/25/23 1312 08/26/23 0120 08/26/23 0719 08/27/23 0516 08/31/23 0503  AST 27 290* 228* 95* 45*  ALT 22 242* 200* 133* 52*  -Repeat LFTs in the AM and continue to Monitor Hepatic Fxn Trend  Overweight: Complicates overall prognosis and care. Estimated body mass index is 29 kg/m as calculated from the following:   Height as of this encounter: 6\' 1"  (1.854 m).   Weight as of this encounter: 99.7 kg. Weight Loss and Dietary Counseling given   DVT prophylaxis: SCDs Start: 08/30/23 1940 heparin injection 5,000 Units Start: 08/26/23 0815 SCDs Start: 08/26/23 0205    Code Status: Full Code Family Communication: No family present at bedside   Disposition Plan:  Level of care: Progressive Status is: Inpatient Remains inpatient appropriate because: Pending CIR placement   Consultants:  PCCM transfer Cardiology EP cardiology Advanced heart failure team  Procedures:  As delineated as above  Antimicrobials:  Anti-infectives (From admission, onward)    Start     Dose/Rate Route Frequency Ordered Stop   08/30/23 2239  ceFAZolin (ANCEF) IVPB 1 g/50 mL premix        1 g 100 mL/hr over 30 Minutes Intravenous Every 6 hours 08/30/23 1939 08/31/23 1022   08/30/23 1623  ceFAZolin (ANCEF) 2-4 GM/100ML-% IVPB       Note to Pharmacy: Beverley Fiedler B:  cabinet override      08/30/23 1623 08/30/23 1650   08/30/23 1300  gentamicin (GARAMYCIN) 80 mg in sodium chloride 0.9 % 500 mL irrigation        80 mg Irrigation On call 08/30/23 0950 08/30/23 1852   08/30/23 1300  ceFAZolin (ANCEF) IVPB 2g/100 mL premix        2 g 200 mL/hr over 30 Minutes Intravenous On call 08/30/23 0949 08/30/23 1430   08/26/23 1200  cefTRIAXone (ROCEPHIN) 2 g in sodium chloride 0.9 % 100 mL IVPB        2 g 200 mL/hr over 30 Minutes Intravenous Every 24 hours 08/26/23 0738 08/31/23 1024   08/26/23 1000  piperacillin-tazobactam (ZOSYN) IVPB 3.375 g  Status:  Discontinued        3.375 g 12.5 mL/hr over 240 Minutes Intravenous Every 8 hours 08/26/23 0252 08/26/23 0738   08/26/23 0300  piperacillin-tazobactam (ZOSYN) IVPB 3.375 g        3.375 g 100 mL/hr over 30 Minutes Intravenous  Once 08/26/23 0252 08/26/23 0447       Subjective: Seen and examined at bedside in he is working with the nurse and walking back to the bed with a  walker.  No nausea vomiting.  Denies any soreness.  Feels okay.  Continues to feel weak though.  No other concerns or questions noted  Objective: Vitals:   08/31/23 0016 08/31/23 0409 08/31/23 0743 08/31/23 1650  BP: 121/76 (!) 146/89 130/82 119/74  Pulse: 90 90 89 79  Resp: 16 15 16 17   Temp: 98.4 F (36.9 C) 98.2 F (36.8 C) 98.5 F (36.9 C) 98 F (36.7 C)  TempSrc: Oral Oral Oral Oral  SpO2: 96% 94% 94% 94%  Weight:  99.7 kg    Height:        Intake/Output Summary (Last 24 hours) at 08/31/2023 1710 Last data filed at 08/31/2023 0800 Gross per 24 hour  Intake 778.24 ml  Output 2250 ml  Net -1471.76 ml   Filed Weights   08/29/23 0500 08/30/23 0414 08/31/23 0409  Weight: 102.2 kg 100.3 kg 99.7 kg   Examination: Physical Exam:  Constitutional: WN/WD overweight chronically ill-appearing African-American male in no acute distress Respiratory: Diminished to auscultation bilaterally, no wheezing, rales, rhonchi or crackles. Normal  respiratory effort and patient is not tachypenic. No accessory muscle use.  Unlabored breathing but is wearing supplemental oxygen nasal cannula Cardiovascular: RRR, no murmurs / rubs / gallops. S1 and S2 auscultated.  Mild lower extremity edema Abdomen: Soft, non-tender, distended secondary to body habitus. Bowel sounds positive.  GU: Deferred. Musculoskeletal: No clubbing / cyanosis of digits/nails. No joint deformity upper and lower extremities. Skin: No rashes, lesions, ulcers on limited skin evaluation. No induration; Warm and dry.  Neurologic: CN 2-12 grossly intact with no focal deficits. Romberg sign and cerebellar reflexes not assessed.  Psychiatric: Normal judgment and insight. Alert and oriented x 3. Normal mood and appropriate affect.   Data Reviewed: I have personally reviewed following labs and imaging studies  CBC: Recent Labs  Lab 08/27/23 0516 08/27/23 0520 08/28/23 0104 08/29/23 0544 08/30/23 0549 08/31/23 0503  WBC 27.1*  --  23.9* 18.4* 12.7* 17.6*  NEUTROABS  --   --   --   --   --  15.9*  HGB 14.1 14.3 13.2 13.1 13.5 13.7  HCT 40.1 42.0 38.7* 39.0 39.7 40.0  MCV 82.5  --  84.3 85.7 84.8 83.9  PLT 235  --  179 180 173 219   Basic Metabolic Panel: Recent Labs  Lab 08/26/23 0434 08/26/23 0439 08/27/23 0516 08/27/23 0520 08/28/23 0104 08/29/23 0544 08/30/23 0549 08/31/23 0503  NA 134*   < > 135 135 134* 134* 136 136  K 4.7   < > 4.1 4.0 4.1 4.3 4.1 4.4  CL 105  --  106  --  102 97* 98 101  CO2 17*  --  19*  --  22 26 28 27   GLUCOSE 283*  --  186*  --  252* 143* 149* 236*  BUN 11  --  16  --  26* 18 16 26*  CREATININE 1.16  --  1.30*  --  1.33* 0.93 1.00 1.32*  CALCIUM 7.6*  --  8.4*  --  8.2* 8.2* 8.4* 8.8*  MG 2.0  --  1.8  --  2.2 1.9 2.0 2.1  PHOS 3.6  --   --   --   --   --   --  3.3   < > = values in this interval not displayed.   GFR: Estimated Creatinine Clearance: 82.2 mL/min (A) (by C-G formula based on SCr of 1.32 mg/dL (H)). Liver  Function Tests: Recent Labs  Lab  08/25/23 1312 08/26/23 0120 08/26/23 0719 08/27/23 0516 08/31/23 0503  AST 27 290* 228* 95* 45*  ALT 22 242* 200* 133* 52*  ALKPHOS 73 105 85 65 84  BILITOT 0.6 0.6 1.2 0.5 0.5  PROT 6.6 6.1* 5.9* 5.4* 5.9*  ALBUMIN 3.8 3.3* 3.2* 2.9* 2.7*   No results for input(s): "LIPASE", "AMYLASE" in the last 168 hours. No results for input(s): "AMMONIA" in the last 168 hours. Coagulation Profile: Recent Labs  Lab 08/26/23 0120  INR 1.1   Cardiac Enzymes: Recent Labs  Lab 08/26/23 0719  CKTOTAL 1,253*   BNP (last 3 results) No results for input(s): "PROBNP" in the last 8760 hours. HbA1C: No results for input(s): "HGBA1C" in the last 72 hours. CBG: Recent Labs  Lab 08/31/23 0015 08/31/23 0407 08/31/23 0731 08/31/23 1135 08/31/23 1549  GLUCAP 416* 225* 232* 376* 287*   Lipid Profile: No results for input(s): "CHOL", "HDL", "LDLCALC", "TRIG", "CHOLHDL", "LDLDIRECT" in the last 72 hours. Thyroid Function Tests: No results for input(s): "TSH", "T4TOTAL", "FREET4", "T3FREE", "THYROIDAB" in the last 72 hours. Anemia Panel: No results for input(s): "VITAMINB12", "FOLATE", "FERRITIN", "TIBC", "IRON", "RETICCTPCT" in the last 72 hours. Sepsis Labs: Recent Labs  Lab 08/26/23 0122 08/26/23 0434 08/26/23 0719 08/26/23 0724 08/26/23 2146  PROCALCITON  --   --  21.78  --   --   LATICACIDVEN 5.5* 2.2*  --  4.1* 3.6*   Recent Results (from the past 240 hours)  Resp panel by RT-PCR (RSV, Flu A&B, Covid) Anterior Nasal Swab     Status: None   Collection Time: 08/25/23  1:12 PM   Specimen: Anterior Nasal Swab  Result Value Ref Range Status   SARS Coronavirus 2 by RT PCR NEGATIVE NEGATIVE Final   Influenza A by PCR NEGATIVE NEGATIVE Final   Influenza B by PCR NEGATIVE NEGATIVE Final    Comment: (NOTE) The Xpert Xpress SARS-CoV-2/FLU/RSV plus assay is intended as an aid in the diagnosis of influenza from Nasopharyngeal swab specimens and should  not be used as a sole basis for treatment. Nasal washings and aspirates are unacceptable for Xpert Xpress SARS-CoV-2/FLU/RSV testing.  Fact Sheet for Patients: BloggerCourse.com  Fact Sheet for Healthcare Providers: SeriousBroker.it  This test is not yet approved or cleared by the [place] FDA and has been authorized for detection and/or diagnosis of SARS-CoV-2 by FDA under an Emergency Use Authorization (EUA). This EUA will remain in effect (meaning this test can be used) for the duration of the COVID-19 declaration under Section 564(b)(1) of the Act, 21 U.S.C. section 360bbb-3(b)(1), unless the authorization is terminated or revoked.     Resp Syncytial Virus by PCR NEGATIVE NEGATIVE Final    Comment: (NOTE) Fact Sheet for Patients: BloggerCourse.com  Fact Sheet for Healthcare Providers: SeriousBroker.it  This test is not yet approved or cleared by the [place] FDA and has been authorized for detection and/or diagnosis of SARS-CoV-2 by FDA under an Emergency Use Authorization (EUA). This EUA will remain in effect (meaning this test can be used) for the duration of the COVID-19 declaration under Section 564(b)(1) of the Act, 21 U.S.C. section 360bbb-3(b)(1), unless the authorization is terminated or revoked.  Performed at [facility], [address], [place], [place] [address]   MRSA Next Gen by PCR, Nasal     Status: None   Collection Time: 08/26/23  3:18 AM   Specimen: Nasal Mucosa; Nasal Swab  Result Value Ref Range Status   MRSA by PCR Next Gen NOT  DETECTED NOT DETECTED Final    Comment: (NOTE) The GeneXpert MRSA Assay (FDA approved for NASAL specimens only), is one component of a comprehensive MRSA colonization surveillance program. It is not intended to diagnose MRSA infection nor to guide or monitor treatment for MRSA infections. Test performance is  not FDA approved in patients less than 16 years old. Performed at [facility], [address], [place], [place] [address]   Culture, Respiratory w Gram Stain     Status: None   Collection Time: 08/26/23  3:20 AM   Specimen: Tracheal Aspirate; Respiratory  Result Value Ref Range Status   Specimen Description TRACHEAL ASPIRATE  Final   Special Requests NONE  Final   Gram Stain   Final    ABUNDANT WBC PRESENT, PREDOMINANTLY PMN FEW GRAM POSITIVE COCCI IN PAIRS IN CHAINS    Culture   Final    MODERATE Normal respiratory flora-no Staph aureus or Pseudomonas seen Performed at [facility], [address] [place], [place] [address]    Report Status 08/28/2023 FINAL  Final  Culture, blood (Routine X 2) w Reflex to ID Panel     Status: None   Collection Time: 08/26/23  5:32 AM   Specimen: BLOOD RIGHT HAND  Result Value Ref Range Status   Specimen Description BLOOD RIGHT HAND  Final   Special Requests   Final    BOTTLES DRAWN AEROBIC AND ANAEROBIC Blood Culture results may not be optimal due to an inadequate volume of blood received in culture bottles   Culture   Final    NO GROWTH 5 DAYS Performed at [facility], [address], [place], [place] [address]    Report Status 08/31/2023 FINAL  Final  Culture, blood (Routine X 2) w Reflex to ID Panel     Status: None   Collection Time: 08/26/23  5:35 AM   Specimen: BLOOD RIGHT ARM  Result Value Ref Range Status   Specimen Description BLOOD RIGHT ARM  Final   Special Requests   Final    BOTTLES DRAWN AEROBIC ONLY Blood Culture results may not be optimal due to an inadequate volume of blood received in culture bottles   Culture   Final    NO GROWTH 5 DAYS Performed at [facility], [address], [place], [place] [address]    Report Status 08/31/2023 FINAL  Final  Surgical PCR screen     Status: None   Collection Time: 08/30/23  2:49 PM   Specimen: Nasal Mucosa; Nasal Swab  Result Value Ref Range Status    MRSA, PCR NEGATIVE NEGATIVE Final   Staphylococcus aureus NEGATIVE NEGATIVE Final    Comment: (NOTE) The Xpert SA Assay (FDA approved for NASAL specimens in patients 22 years of age and older), is one component of a comprehensive surveillance program. It is not intended to diagnose infection nor to guide or monitor treatment. Performed at [facility], [address], [place], [place] [address]     Radiology Studies: DG Chest 2 View Result Date: 08/31/2023 CLINICAL DATA:  AICD placement. EXAM: CHEST - 2 VIEW COMPARISON:  08/27/2023 FINDINGS: Heart size is normal. Single lead AICD is seen with tip overlying the right ventricle. No pneumothorax visualized. Decreased interstitial edema seen since previous study. No No evidence of focal consolidation or pleural effusion. IMPRESSION: Single lead AICD in expected position.  No pneumothorax visualized. Decreased pulmonary interstitial edema. Electronically Signed   By: Dietrich Pates.D.  On: 08/31/2023 10:26   EP PPM/ICD IMPLANT Result Date: 08/30/2023  SURGEON:  Loman Brooklyn, MD    PREPROCEDURE DIAGNOSES:  1. Cardiac arrest due to VT/VF    POSTPROCEDURE DIAGNOSES:  1. Cardiac arrest due to VT/VF    PROCEDURES:   1. ICD implantation.  2. Left upper extremity venography    INTRODUCTION: RAYMONT ANDREONI is a 53 y.o. male who presented to the hospital with cardiac arrest due to ventricular tachycardia/ventricular fibrillation.  Cardiac MRI shows an ejection fraction of 40% and no scar.  The patient therefore  presents today for ICD implantation.    DESCRIPTION OF PROCEDURE:  Informed written consent was obtained and the patient was brought to the electrophysiology lab in the fasting state. The patient was adequately sedated with intravenous Versed, and fentanyl as outlined in the nursing report.  The patient's left chest was prepped and draped in the usual sterile fashion by the EP lab staff.  The skin overlying the left deltopectoral region was  infiltrated with lidocaine for local analgesia.  A 5-cm incision was made over the left deltopectoral region.  A left subcutaneous defibrillator pocket was fashioned using a combination of sharp and blunt dissection.  Electrocautery was used to assure hemostasis. Left Upper extremity Venography:  A venogram of the left upper extremity was performed which revealed a moderate sized left axillary vein which emptied into a moderate sized left subclavian vein.  The axillary vein was subtotally occluded at the level of the first rib with collateral circulation around this area. RA/RV Lead Placement: The left axillary vein was cannulated with fluoroscopic visualization.  Using multiple sheaths and dilators, a 7 French peel-away sheath was advanced across the obstruction and into the superior vena cava.  Through the left axillary vein, a [employer] [place], model 8295A-21 (serial number [id number]) right ventricular defibrillator lead was advanced with fluoroscopic visualization into the right ventricular apex.  The right ventricular lead R-wave measured 5 mV with impedance of 701 ohms and a threshold of 1 volts at 0.5 milliseconds. The leads were secured to the pectoralis  fascia using #2 silk suture over the suture sleeves.  The pocket then  irrigated with copious gentamicin solution.  The leads were then  connected to an [employer] GallantVR (serial  Number [id number]) ICD.  The defibrillator was placed into the  pocket.  The pocket was then closed in 3 layers with 2.0 Vicryl suture  for the subcutaneous and 3.0 Vicryl suture subcuticular layers.  EBL<19ml. Steri-Strips and a  sterile dressing were then applied.    CONCLUSIONS:  1. Cardiac arrest due to VT/VF  2. Successful ICD implantation.  3. No early apparent complications.   MR CARDIAC MORPHOLOGY W WO CONTRAST Result Date: 08/29/2023 CLINICAL DATA:  Clinical question of VF arrest Study assumes BSA of 2.29 m2. EXAM: CARDIAC MRI TECHNIQUE: The patient was  scanned on a 1.5 Tesla GE magnet. A dedicated cardiac coil was used. Functional imaging was done using Fiesta sequences. 2,3, and 4 chamber views were done to assess for RWMA's. Modified Simpson's rule using a short axis stack was used to calculate an ejection fraction on a dedicated work [employer], [employer]. The patient received 10 cc of Gadavist. After 10 minutes inversion recovery sequences were used to assess for infiltration and scar tissue. Flow quantification was performed 2 times during this examination with flow quantification performed at the levels of the ascending aorta above the valve, pulmonary artery above the valve. CONTRAST:  10  cc  of Gadavist FINDINGS: 1. Normal left ventricular size, with LVEDD 52 mm, and LVEDVi 67 mL/m2. Normal left ventricular thickness. Mild to moderate decrease in left ventricular systolic function (LVEF =40%). There are no regional wall motion abnormalities, but global hypokinesis. Normal resting perfusion. Left ventricular parametric mapping notable for normal T2 and ECV. There is no late gadolinium enhancement in the left ventricular myocardium. There is no evidence of LV thrombus. 2. Normal right ventricular size with RVEDVI 69 mL/m2. Normal right ventricular thickness. Mild decrease in right ventricular systolic function (RVEF =45%). There is septal and apical hypokinesis with preserved planar excursion. 3.  Normal left and right atrial size. 4. Normal size of the aortic root, ascending aorta and pulmonary artery. 5. Valve assessment: Aortic Valve: Tri-leaflet aortic valve. Qualitatively, there is no significant regurgitation. Regurgitant fraction <1%. Pulmonic Valve: Qualitatively, there is no significant regurgitation. Regurgitant fraction < 1%. Tricuspid Valve: Qualitatively, there is no significant regurgitation. Regurgitant fraction 1%. Mitral Valve: Qualitatively, there is no significant regurgitation. Regurgitant fraction 1%. 6.  Normal pericardium.  No  pericardial effusion. 7. Bilateral dependent ground glass opacities and consolidation. Recommended dedicated study if concerned for non-cardiac pathology. 8. Breath hold artifacts noted. Study was performed free breathing. This decreased the sensitivity of volumetric assessments. IMPRESSION: 1. Mild to moderate decrease in left ventricular systolic function (LVEF =40%). 2. There is no late gadolinium enhancement in the left ventricular myocardium. Normal parametric mapping and tissue characterization. 3. Mild decrease in right ventricular systolic function (RVEF =45%). Riley Lam MD Electronically Signed   By: Riley Lam M.D.   On: 08/29/2023 18:37   MR CARDIAC VELOCITY FLOW MAP Result Date: 08/29/2023 CLINICAL DATA:  Clinical question of VF arrest Study assumes BSA of 2.29 m2. EXAM: CARDIAC MRI TECHNIQUE: The patient was scanned on a 1.5 Tesla GE magnet. A dedicated cardiac coil was used. Functional imaging was done using Fiesta sequences. 2,3, and 4 chamber views were done to assess for RWMA's. Modified Simpson's rule using a short axis stack was used to calculate an ejection fraction on a dedicated work [employer] officer, trade [employer]. The patient received 10 cc of Gadavist. After 10 minutes inversion recovery sequences were used to assess for infiltration and scar tissue. Flow quantification was performed 2 times during this examination with flow quantification performed at the levels of the ascending aorta above the valve, pulmonary artery above the valve. CONTRAST:  10 cc  of Gadavist FINDINGS: 1. Normal left ventricular size, with LVEDD 52 mm, and LVEDVi 67 mL/m2. Normal left ventricular thickness. Mild to moderate decrease in left ventricular systolic function (LVEF =40%). There are no regional wall motion abnormalities, but global hypokinesis. Normal resting perfusion. Left ventricular parametric mapping notable for normal T2 and ECV. There is no late gadolinium enhancement in the left  ventricular myocardium. There is no evidence of LV thrombus. 2. Normal right ventricular size with RVEDVI 69 mL/m2. Normal right ventricular thickness. Mild decrease in right ventricular systolic function (RVEF =45%). There is septal and apical hypokinesis with preserved planar excursion. 3.  Normal left and right atrial size. 4. Normal size of the aortic root, ascending aorta and pulmonary artery. 5. Valve assessment: Aortic Valve: Tri-leaflet aortic valve. Qualitatively, there is no significant regurgitation. Regurgitant fraction <1%. Pulmonic Valve: Qualitatively, there is no significant regurgitation. Regurgitant fraction < 1%. Tricuspid Valve: Qualitatively, there is no significant regurgitation. Regurgitant fraction 1%. Mitral Valve: Qualitatively, there is no significant regurgitation. Regurgitant fraction 1%. 6.  Normal pericardium.  No  pericardial effusion. 7. Bilateral dependent ground glass opacities and consolidation. Recommended dedicated study if concerned for non-cardiac pathology. 8. Breath hold artifacts noted. Study was performed free breathing. This decreased the sensitivity of volumetric assessments. IMPRESSION: 1. Mild to moderate decrease in left ventricular systolic function (LVEF =40%). 2. There is no late gadolinium enhancement in the left ventricular myocardium. Normal parametric mapping and tissue characterization. 3. Mild decrease in right ventricular systolic function (RVEF =45%). Riley Lam MD Electronically Signed   By: Riley Lam M.D.   On: 08/29/2023 18:37   MR CARDIAC VELOCITY FLOW MAP Result Date: 08/29/2023 CLINICAL DATA:  Clinical question of VF arrest Study assumes BSA of 2.29 m2. EXAM: CARDIAC MRI TECHNIQUE: The patient was scanned on a 1.5 Tesla GE magnet. A dedicated cardiac coil was used. Functional imaging was done using Fiesta sequences. 2,3, and 4 chamber views were done to assess for RWMA's. Modified Simpson's rule using a short axis stack was  used to calculate an ejection fraction on a dedicated work [employer]. The patient received 10 cc of Gadavist. After 10 minutes inversion recovery sequences were used to assess for infiltration and scar tissue. Flow quantification was performed 2 times during this examination with flow quantification performed at the levels of the ascending aorta above the valve, pulmonary artery above the valve. CONTRAST:  10 cc  of Gadavist FINDINGS: 1. Normal left ventricular size, with LVEDD 52 mm, and LVEDVi 67 mL/m2. Normal left ventricular thickness. Mild to moderate decrease in left ventricular systolic function (LVEF =40%). There are no regional wall motion abnormalities, but global hypokinesis. Normal resting perfusion. Left ventricular parametric mapping notable for normal T2 and ECV. There is no late gadolinium enhancement in the left ventricular myocardium. There is no evidence of LV thrombus. 2. Normal right ventricular size with RVEDVI 69 mL/m2. Normal right ventricular thickness. Mild decrease in right ventricular systolic function (RVEF =45%). There is septal and apical hypokinesis with preserved planar excursion. 3.  Normal left and right atrial size. 4. Normal size of the aortic root, ascending aorta and pulmonary artery. 5. Valve assessment: Aortic Valve: Tri-leaflet aortic valve. Qualitatively, there is no significant regurgitation. Regurgitant fraction <1%. Pulmonic Valve: Qualitatively, there is no significant regurgitation. Regurgitant fraction < 1%. Tricuspid Valve: Qualitatively, there is no significant regurgitation. Regurgitant fraction 1%. Mitral Valve: Qualitatively, there is no significant regurgitation. Regurgitant fraction 1%. 6.  Normal pericardium.  No pericardial effusion. 7. Bilateral dependent ground glass opacities and consolidation. Recommended dedicated study if concerned for non-cardiac pathology. 8. Breath hold artifacts noted. Study was performed free breathing. This  decreased the sensitivity of volumetric assessments. IMPRESSION: 1. Mild to moderate decrease in left ventricular systolic function (LVEF =40%). 2. There is no late gadolinium enhancement in the left ventricular myocardium. Normal parametric mapping and tissue characterization. 3. Mild decrease in right ventricular systolic function (RVEF =45%). Riley Lam MD Electronically Signed   By: Riley Lam M.D.   On: 08/29/2023 18:37   Scheduled Meds:  amiodarone  400 mg Oral Daily   Chlorhexidine Gluconate Cloth  6 each Topical Daily   dalfampridine  10 mg Oral BID   feeding supplement  237 mL Oral TID BM   haloperidol lactate  5 mg Intravenous Once   heparin injection (subcutaneous)  5,000 Units Subcutaneous Q8H   insulin aspart  0-20 Units Subcutaneous Q4H   insulin glargine-yfgn  35 Units Subcutaneous BID   levothyroxine  125 mcg Oral Q0600   polyethylene glycol  17 g Oral BID   rOPINIRole  0.25 mg Oral QHS   sacubitril-valsartan  1 tablet Oral BID   senna-docusate  2 tablet Oral BID   sodium chloride flush  3-10 mL Intravenous Q12H   spironolactone  25 mg Oral Daily   Continuous Infusions:   LOS: 5 days   Marguerita Merles, DO [facility] Available via Epic secure chat 7am-7pm After these hours, please refer to coverage provider listed on [email or web address] 08/31/2023, 5:10 PM

## 2023-08-31 NOTE — Progress Notes (Signed)
   Rounding Note    Patient Name: Johnathan Ross Date of Encounter: 08/31/2023  [facility] Cardiologist: None   Subjective   No acute events overnight.  Washing up this morning.  Wife at bedside.  Vital Signs    Vitals:   08/30/23 1932 08/31/23 0016 08/31/23 0409 08/31/23 0743  BP: (!) 143/85 121/76 (!) 146/89 130/82  Pulse:  90 90 89  Resp: 18 16 15 16   Temp: 98.7 F (37.1 C) 98.4 F (36.9 C) 98.2 F (36.8 C) 98.5 F (36.9 C)  TempSrc: Oral Oral Oral Oral  SpO2: 95% 96% 94% 94%  Weight:   99.7 kg   Height:        Intake/Output Summary (Last 24 hours) at 08/31/2023 0937 Last data filed at 08/31/2023 0411 Gross per 24 hour  Intake 658.24 ml  Output 2250 ml  Net -1591.76 ml      08/31/2023    4:09 AM 08/30/2023    4:14 AM 08/29/2023    5:00 AM  Last 3 Weights  Weight (lbs) 219 lb 12.8 oz 221 lb 1.9 oz 225 lb 5 oz  Weight (kg) 99.701 kg 100.3 kg 102.2 kg      Telemetry    Personally Reviewed  ECG    Sinus- Personally Reviewed  Physical Exam   GEN: No acute distress.   Cardiac: RRR, no murmurs, rubs, or gallops.  ICD pocket healing well.  No hematoma or significant pain. Respiratory: Clear to auscultation bilaterally. Psych: Normal affect        Device interrogation: Reviewed.  Stable battery and lead parameters.      Assessment & Plan    #History of cardiac arrest #Polymorphic VT/VF #ICD in situ Doing well after ICD implant.  Chest x-ray shows lead within the right ventricle.  Device interrogation shows stable lead parameters this morning. Transition IV amiodarone to oral.  400 mg by mouth once daily.  #Acute systolic heart failure Appears well compensated this morning. Continue spironolactone, Mosie Lukes to discharge from an EP perspective.  Sheria Lang T. Lalla Brothers, MD, [facility], [facility] Cardiac Electrophysiology

## 2023-09-01 ENCOUNTER — Inpatient Hospital Stay (HOSPITAL_COMMUNITY)

## 2023-09-01 DIAGNOSIS — I5082 Biventricular heart failure: Secondary | ICD-10-CM | POA: Diagnosis not present

## 2023-09-01 DIAGNOSIS — R579 Shock, unspecified: Secondary | ICD-10-CM | POA: Diagnosis not present

## 2023-09-01 DIAGNOSIS — D72829 Elevated white blood cell count, unspecified: Secondary | ICD-10-CM | POA: Diagnosis not present

## 2023-09-01 DIAGNOSIS — I469 Cardiac arrest, cause unspecified: Secondary | ICD-10-CM | POA: Diagnosis not present

## 2023-09-01 LAB — COMPREHENSIVE METABOLIC PANEL WITH GFR
ALT: 40 U/L (ref 0–44)
AST: 32 U/L (ref 15–41)
Albumin: 2.7 g/dL — ABNORMAL LOW (ref 3.5–5.0)
Alkaline Phosphatase: 76 U/L (ref 38–126)
Anion gap: 9 (ref 5–15)
BUN: 23 mg/dL — ABNORMAL HIGH (ref 6–20)
CO2: 28 mmol/L (ref 22–32)
Calcium: 8.6 mg/dL — ABNORMAL LOW (ref 8.9–10.3)
Chloride: 102 mmol/L (ref 98–111)
Creatinine, Ser: 1.21 mg/dL (ref 0.61–1.24)
GFR, Estimated: >60 mL/min (ref >60)
Glucose, Bld: 52 mg/dL — ABNORMAL LOW (ref 70–99)
Potassium: 3.9 mmol/L (ref 3.5–5.1)
Sodium: 139 mmol/L (ref 135–145)
Total Bilirubin: 0.7 mg/dL (ref 0.0–1.2)
Total Protein: 5.4 g/dL — ABNORMAL LOW (ref 6.5–8.1)

## 2023-09-01 LAB — CBC WITH DIFFERENTIAL/PLATELET
Abs Immature Granulocytes: 0.35 10*3/uL — ABNORMAL HIGH (ref 0.00–0.07)
Basophils Absolute: 0.1 10*3/uL (ref 0.0–0.1)
Basophils Relative: 0 %
Eosinophils Absolute: 0 10*3/uL (ref 0.0–0.5)
Eosinophils Relative: 0 %
HCT: 40.1 % (ref 39.0–52.0)
Hemoglobin: 13.4 g/dL (ref 13.0–17.0)
Immature Granulocytes: 2 %
Lymphocytes Relative: 9 %
Lymphs Abs: 2 10*3/uL (ref 0.7–4.0)
MCH: 28.7 pg (ref 26.0–34.0)
MCHC: 33.4 g/dL (ref 30.0–36.0)
MCV: 85.9 fL (ref 80.0–100.0)
Monocytes Absolute: 2 10*3/uL — ABNORMAL HIGH (ref 0.1–1.0)
Monocytes Relative: 9 %
Neutro Abs: 17.8 10*3/uL — ABNORMAL HIGH (ref 1.7–7.7)
Neutrophils Relative %: 80 %
Platelets: 256 10*3/uL (ref 150–400)
RBC: 4.67 MIL/uL (ref 4.22–5.81)
RDW: 13.7 % (ref 11.5–15.5)
WBC: 22.1 10*3/uL — ABNORMAL HIGH (ref 4.0–10.5)
nRBC: 0 % (ref 0.0–0.2)

## 2023-09-01 LAB — GLUCOSE, CAPILLARY
Glucose-Capillary: 145 mg/dL — ABNORMAL HIGH (ref 70–99)
Glucose-Capillary: 150 mg/dL — ABNORMAL HIGH (ref 70–99)
Glucose-Capillary: 190 mg/dL — ABNORMAL HIGH (ref 70–99)
Glucose-Capillary: 224 mg/dL — ABNORMAL HIGH (ref 70–99)
Glucose-Capillary: 291 mg/dL — ABNORMAL HIGH (ref 70–99)
Glucose-Capillary: 374 mg/dL — ABNORMAL HIGH (ref 70–99)
Glucose-Capillary: 40 mg/dL — CL (ref 70–99)
Glucose-Capillary: 49 mg/dL — ABNORMAL LOW (ref 70–99)
Glucose-Capillary: 90 mg/dL (ref 70–99)

## 2023-09-01 LAB — MAGNESIUM: Magnesium: 1.9 mg/dL (ref 1.7–2.4)

## 2023-09-01 LAB — PHOSPHORUS: Phosphorus: 4.5 mg/dL (ref 2.5–4.6)

## 2023-09-01 MED ORDER — INSULIN ASPART 100 UNIT/ML IJ SOLN
0.0000 [IU] | Freq: Every day | INTRAMUSCULAR | Status: DC
  Administered 2023-09-01: 3 [IU] via SUBCUTANEOUS
  Administered 2023-09-02: 5 [IU] via SUBCUTANEOUS

## 2023-09-01 MED ORDER — MAGIC MOUTHWASH W/LIDOCAINE
5.0000 mL | Freq: Four times a day (QID) | ORAL | Status: DC | PRN
  Administered 2023-09-02: 5 mL via ORAL
  Filled 2023-09-01 (×3): qty 5

## 2023-09-01 MED ORDER — INSULIN ASPART 100 UNIT/ML IJ SOLN
0.0000 [IU] | Freq: Three times a day (TID) | INTRAMUSCULAR | Status: DC
  Administered 2023-09-01: 9 [IU] via SUBCUTANEOUS
  Administered 2023-09-01: 2 [IU] via SUBCUTANEOUS
  Administered 2023-09-02: 7 [IU] via SUBCUTANEOUS
  Administered 2023-09-02: 2 [IU] via SUBCUTANEOUS
  Administered 2023-09-03: 7 [IU] via SUBCUTANEOUS
  Administered 2023-09-03: 2 [IU] via SUBCUTANEOUS

## 2023-09-01 MED ORDER — INSULIN GLARGINE-YFGN 100 UNIT/ML ~~LOC~~ SOLN
35.0000 [IU] | Freq: Every day | SUBCUTANEOUS | Status: DC
  Administered 2023-09-02 – 2023-09-03 (×2): 35 [IU] via SUBCUTANEOUS
  Filled 2023-09-01 (×2): qty 0.35

## 2023-09-01 NOTE — Progress Notes (Signed)
   Rounding Note    Patient Name: Johnathan Ross Date of Encounter: 09/01/2023  [facility] Cardiologist: None   Subjective   No acute events overnight.  Family at bedside.  Vital Signs    Vitals:   08/31/23 2020 09/01/23 0446 09/01/23 0500 09/01/23 0735  BP: 120/71 121/72  125/82  Pulse: 90 79  83  Resp: 16 19  18   Temp: 98.6 F (37 C) 98 F (36.7 C)  98.2 F (36.8 C)  TempSrc: Oral Oral  Oral  SpO2: 96% 100%  97%  Weight:   99.3 kg   Height:        Intake/Output Summary (Last 24 hours) at 09/01/2023 0937 Last data filed at 09/01/2023 0854 Gross per 24 hour  Intake 1320 ml  Output 1500 ml  Net -180 ml      09/01/2023    5:00 AM 08/31/2023    4:09 AM 08/30/2023    4:14 AM  Last 3 Weights  Weight (lbs) 218 lb 14.4 oz 219 lb 12.8 oz 221 lb 1.9 oz  Weight (kg) 99.292 kg 99.701 kg 100.3 kg      Telemetry    Personally Reviewed  ECG    Sinus- Personally Reviewed  Physical Exam   GEN: No acute distress.   Cardiac: RRR, no murmurs, rubs, or gallops.  ICD pocket healing well.  No hematoma or significant pain. Respiratory: Clear to auscultation bilaterally. Psych: Normal affect    Device interrogation: Reviewed.  Stable battery and lead parameters.      Assessment & Plan    #History of cardiac arrest #Polymorphic VT/VF #ICD in situ Doing well after ICD implant.  Chest x-ray shows lead within the right ventricle.  Device interrogation shows stable lead parameters. Continue amiodarone 400 mg by mouth once daily   #Acute systolic heart failure Appears well compensated this morning. Continue spironolactone, Mosie Lukes to discharge from an EP perspective.Marland Kitchen  Awaiting CIR placement.  Sheria Lang T. Lalla Brothers, MD, [facility], [facility] Cardiac Electrophysiology

## 2023-09-01 NOTE — Progress Notes (Signed)
 PROGRESS NOTE    Johnathan Ross  ZOX:[id number] DOB: Jan 23, 1971 DOA: 08/26/2023 PCP: Etta Grandchild, MD   Brief Narrative:  The patient is a 53 year old overweight African-American male with a past medical history significant for relapsing remitting MS on [place], hyperlipidemia, hypertension, hypothyroidism, diabetes mellitus type 1 as well as other comorbidities who presented to the ED after collapsing requiring bystander CPR and is found to be in V-fib V. tach arrest.  CPR lasted about 20 minutes and he was received shocked 8 times and epi x 2 and amiodarone.  He is brought to the ED no is normal sinus rhythm and required peripheral neuropathy.  No PE was done and is admitted to the critical care service.  Cardiology evaluated and patient was found to have a mixed cardiogenic and septic shock on admission which is now improved.  Further workup also done and showed acute biventricular systolic CHF with right ventricular dominant shock.  EP and advanced heart failure team consulted.  He was transferred to [facility] service on 08/30/2023.  He underwent further workup and is s/p ICD placement and now being transitioned from IV Amiodarone to oral Amiodarone on 08/31/23.   Assessment and Plan:  Witnessed out of hospital Vfib/VTach arrest x 20 min intermittent-ROSC after multiple defibrillations and bystander CPR" had received epi and amnio trops flat, EF down to 30-35%, query primary arrythmia and new cardiomyopathy; On Amio as on Milrinone gtt per AHF team along with Diuresis and GDMT. Cardiac MRI done and getting showed no evidence of LGE as below and also did show mild to moderate decrease in left ventricular systolic function of 40% and the mild decrease in right ventricular systolic function with RV of 45%. He is s/p ICD placement and being transitioned from IV Amiodarone to oral 400 mg po Daily  PT OT recommending acute inpatient rehabilitation and pending placement  Mix cardiogenic and septic  (aspiration), improved: Septic shock is resolved and he is blood pressure stable off pressors.  WBC continue to trend and he has received his short course of antibiotics and completed with IV ceftriaxone.  Blood cultures showing no growth to date PCT was 21.78 on last check last week  Acute biventricular Systolic Heart Failure with Right Ventricular dominant shock Further care per Cardiology.  BNP was 102.9 on admission he is on now on Entresto 49/51 twice daily, spironolactone 12 5 mg p.o. daily, off of milrinone is Co-Ox stable.  Central line has been removed.  No evidence of late gadolinium enhancement in the left ventricular myocardium and had normal parametric mapping and tissue characterization on the cardiac MRI and the cardiologist feels that it is hopefully dysfunction postarrest stunning.  Received ICD 08/30/23. Deferring initiation of SGLT2 inhibitor to Cardiology  Post arrest Encephalopathy: Had Some lingering delirium but overall improved and appears baseline; has Haloperidol 5 mg as needed  Post arrest resp failure with probable aspiration: improving, some lingering hypoxemia; doing well with IS. SpO2: 96 %; O2 Flow Rate (L/min): 2 L/min, FiO2 (%): 40 %. CXR yesterday AM showed that the single lead AICD was in expected position and there was no pneumothorax visualized with decreased pulmonary interstitial edema. Repeat CXR in the AM  History of HTN, HLD: Further care per Cardiology  Type 1 DM: Off of insulin drip. Semglee 35 units twice daily changed to just daily.  Resistant NovoLog sliding scale insulin every 4 to sensitive NovoLog just AC and at bedtime  Multiple Sclerosis: C/w Dalfampridine TB12 10 mg po BID  Hypothyroidism: TSH was 2.213, continue Levothyroxine 125 mcg p.o. daily  RLS Type Symptoms- related to MS, some breakthrough, now on ropinirole 0.25 mg p.o. nightly  Leukocytosis: Improving but worsened in the setting of Steroid Demargination (received Solumedrol w/ Pacer  placement). WBC went from 27.1 -> 23.9 -> 18.4 -> 12.7 -> 17.6 -> 22.1. CTM and Trend and Repeat CBC in the AM   AKI on CKD Stage 3a: BUN/Cr Trend: Recent Labs  Lab 08/26/23 0434 08/27/23 0516 08/28/23 0104 08/29/23 0544 08/30/23 0549 08/31/23 0503 09/01/23 0408  BUN 11 16 26* 18 16 26* 23*  CREATININE 1.16 1.30* 1.33* 0.93 1.00 1.32* 1.21  -Avoid Nephrotoxic Medications, Contrast Dyes, Hypotension and Dehydration to Ensure Adequate Renal Perfusion and will need to Renally Adjust Meds -Continue to Monitor and Trend Renal Function carefully and repeat CMP in the AM   Abnormal LFTs and Acute Liver Injury in the setting Shock and Cardiac Arrest: LFTs resolved. AST peaked at 290 and ALT peaked at 242. Repeat LFTs intermittently  Hypoalbuminemia: Patient's Albumin now 2.7. CTM and Trend and repeat CMP in the AM  Overweight: Complicates overall prognosis and care. Estimated body mass index is 28.88 kg/m as calculated from the following:   Height as of this encounter: 6\' 1"  (1.854 m).   Weight as of this encounter: 99.3 kg. Weight Loss and Dietary Counseling given   DVT prophylaxis: SCDs Start: 08/30/23 1940 heparin injection 5,000 Units Start: 08/26/23 0815 SCDs Start: 08/26/23 0205    Code Status: Full Code Family Communication: D/w Family present at bedside  Disposition Plan:  Level of care: Progressive Status is: Inpatient Remains inpatient appropriate because: Needs further clinical improvement and needs to go to CIR for strength training and awaiting placement   Consultants:  PCCM transfer Cardiology EP cardiology Advanced heart failure team  Procedures:  As delineated as above  Antimicrobials:  Anti-infectives (From admission, onward)    Start     Dose/Rate Route Frequency Ordered Stop   08/30/23 2239  ceFAZolin (ANCEF) IVPB 1 g/50 mL premix        1 g 100 mL/hr over 30 Minutes Intravenous Every 6 hours 08/30/23 1939 08/31/23 1022   08/30/23 1623  ceFAZolin  (ANCEF) 2-4 GM/100ML-% IVPB       Note to Pharmacy: Beverley Fiedler B: cabinet override      08/30/23 1623 08/30/23 1650   08/30/23 1300  gentamicin (GARAMYCIN) 80 mg in sodium chloride 0.9 % 500 mL irrigation        80 mg Irrigation On call 08/30/23 0950 08/30/23 1852   08/30/23 1300  ceFAZolin (ANCEF) IVPB 2g/100 mL premix        2 g 200 mL/hr over 30 Minutes Intravenous On call 08/30/23 0949 08/30/23 1430   08/26/23 1200  cefTRIAXone (ROCEPHIN) 2 g in sodium chloride 0.9 % 100 mL IVPB        2 g 200 mL/hr over 30 Minutes Intravenous Every 24 hours 08/26/23 0738 08/31/23 1024   08/26/23 1000  piperacillin-tazobactam (ZOSYN) IVPB 3.375 g  Status:  Discontinued        3.375 g 12.5 mL/hr over 240 Minutes Intravenous Every 8 hours 08/26/23 0252 08/26/23 0738   08/26/23 0300  piperacillin-tazobactam (ZOSYN) IVPB 3.375 g        3.375 g 100 mL/hr over 30 Minutes Intravenous  Once 08/26/23 0252 08/26/23 0447       Subjective: Seen and examined at bedside in he is doing fairly well today.  No complaints  of pain.  Blood sugars did drop overnight so insulin is being adjusted.  No nausea or vomiting.  No other concerns or complaints at this time.  Objective: Vitals:   09/01/23 0500 09/01/23 0735 09/01/23 1207 09/01/23 1652  BP:  125/82 114/73 126/77  Pulse:  83 82 82  Resp:  18 18 18   Temp:  98.2 F (36.8 C) 98.3 F (36.8 C) 98.6 F (37 C)  TempSrc:  Oral Oral Oral  SpO2:  97% 98% 96%  Weight: 99.3 kg     Height:        Intake/Output Summary (Last 24 hours) at 09/01/2023 1721 Last data filed at 09/01/2023 1653 Gross per 24 hour  Intake 1680 ml  Output 3575 ml  Net -1895 ml   Filed Weights   08/30/23 0414 08/31/23 0409 09/01/23 0500  Weight: 100.3 kg 99.7 kg 99.3 kg   Examination: Physical Exam:  Constitutional: WN/WD overweight chronically ill-appearing African-American male in no acute distress sitting in the chair bedside Respiratory: Diminished to auscultation bilaterally,  no wheezing, rales, rhonchi or crackles. Normal respiratory effort and patient is not tachypenic. No accessory muscle use.  Unlabored breathing Cardiovascular: RRR, no murmurs / rubs / gallops. S1 and S2 auscultated.  Has some 1+ lower extremity edema Abdomen: Soft, non-tender, distended secondary to body habitus.  Bowel sounds positive.  GU: Deferred. Musculoskeletal: No clubbing / cyanosis of digits/nails. No joint deformity upper and lower extremities.  Skin: No rashes, lesions, ulcers on limited skin evaluation. No induration; Warm and dry.  Neurologic: CN 2-12 grossly intact with no focal deficits. Romberg sign and cerebellar reflexes not assessed.  Psychiatric: Normal judgment and insight. Alert and oriented x 3. Normal mood and appropriate affect.   Data Reviewed: I have personally reviewed following labs and imaging studies  CBC: Recent Labs  Lab 08/28/23 0104 08/29/23 0544 08/30/23 0549 08/31/23 0503 09/01/23 0408  WBC 23.9* 18.4* 12.7* 17.6* 22.1*  NEUTROABS  --   --   --  15.9* 17.8*  HGB 13.2 13.1 13.5 13.7 13.4  HCT 38.7* 39.0 39.7 40.0 40.1  MCV 84.3 85.7 84.8 83.9 85.9  PLT 179 180 173 219 256   Basic Metabolic Panel: Recent Labs  Lab 08/26/23 0434 08/26/23 0439 08/28/23 0104 08/29/23 0544 08/30/23 0549 08/31/23 0503 09/01/23 0408  NA 134*   < > 134* 134* 136 136 139  K 4.7   < > 4.1 4.3 4.1 4.4 3.9  CL 105   < > 102 97* 98 101 102  CO2 17*   < > 22 26 28 27 28   GLUCOSE 283*   < > 252* 143* 149* 236* 52*  BUN 11   < > 26* 18 16 26* 23*  CREATININE 1.16   < > 1.33* 0.93 1.00 1.32* 1.21  CALCIUM 7.6*   < > 8.2* 8.2* 8.4* 8.8* 8.6*  MG 2.0   < > 2.2 1.9 2.0 2.1 1.9  PHOS 3.6  --   --   --   --  3.3 4.5   < > = values in this interval not displayed.   GFR: Estimated Creatinine Clearance: 89.6 mL/min (by C-G formula based on SCr of 1.21 mg/dL). Liver Function Tests: Recent Labs  Lab 08/26/23 0120 08/26/23 0719 08/27/23 0516 08/31/23 0503  09/01/23 0408  AST 290* 228* 95* 45* 32  ALT 242* 200* 133* 52* 40  ALKPHOS 105 85 65 84 76  BILITOT 0.6 1.2 0.5 0.5 0.7  PROT 6.1* 5.9* 5.4*  5.9* 5.4*  ALBUMIN 3.3* 3.2* 2.9* 2.7* 2.7*   No results for input(s): "LIPASE", "AMYLASE" in the last 168 hours. No results for input(s): "AMMONIA" in the last 168 hours. Coagulation Profile: Recent Labs  Lab 08/26/23 0120  INR 1.1   Cardiac Enzymes: Recent Labs  Lab 08/26/23 0719  CKTOTAL 1,253*   BNP (last 3 results) No results for input(s): "PROBNP" in the last 8760 hours. HbA1C: No results for input(s): "HGBA1C" in the last 72 hours. CBG: Recent Labs  Lab 09/01/23 0514 09/01/23 0733 09/01/23 1205 09/01/23 1343 09/01/23 1647  GLUCAP 150* 90 224* 190* 374*   Lipid Profile: No results for input(s): "CHOL", "HDL", "LDLCALC", "TRIG", "CHOLHDL", "LDLDIRECT" in the last 72 hours. Thyroid Function Tests: No results for input(s): "TSH", "T4TOTAL", "FREET4", "T3FREE", "THYROIDAB" in the last 72 hours. Anemia Panel: No results for input(s): "VITAMINB12", "FOLATE", "FERRITIN", "TIBC", "IRON", "RETICCTPCT" in the last 72 hours. Sepsis Labs: Recent Labs  Lab 08/26/23 0122 08/26/23 0434 08/26/23 0719 08/26/23 0724 08/26/23 2146  PROCALCITON  --   --  21.78  --   --   LATICACIDVEN 5.5* 2.2*  --  4.1* 3.6*    Recent Results (from the past 240 hours)  Resp panel by RT-PCR (RSV, Flu A&B, Covid) Anterior Nasal Swab     Status: None   Collection Time: 08/25/23  1:12 PM   Specimen: Anterior Nasal Swab  Result Value Ref Range Status   SARS Coronavirus 2 by RT PCR NEGATIVE NEGATIVE Final   Influenza A by PCR NEGATIVE NEGATIVE Final   Influenza B by PCR NEGATIVE NEGATIVE Final    Comment: (NOTE) The Xpert Xpress SARS-CoV-2/FLU/RSV plus assay is intended as an aid in the diagnosis of influenza from Nasopharyngeal swab specimens and should not be used as a sole basis for treatment. Nasal washings and aspirates are unacceptable for  Xpert Xpress SARS-CoV-2/FLU/RSV testing.  Fact Sheet for Patients: BloggerCourse.com  Fact Sheet for Healthcare Providers: SeriousBroker.it  This test is not yet approved or cleared by the [place] FDA and has been authorized for detection and/or diagnosis of SARS-CoV-2 by FDA under an Emergency Use Authorization (EUA). This EUA will remain in effect (meaning this test can be used) for the duration of the COVID-19 declaration under Section 564(b)(1) of the Act, 21 U.S.C. section 360bbb-3(b)(1), unless the authorization is terminated or revoked.     Resp Syncytial Virus by PCR NEGATIVE NEGATIVE Final    Comment: (NOTE) Fact Sheet for Patients: BloggerCourse.com  Fact Sheet for Healthcare Providers: SeriousBroker.it  This test is not yet approved or cleared by the [place] FDA and has been authorized for detection and/or diagnosis of SARS-CoV-2 by FDA under an Emergency Use Authorization (EUA). This EUA will remain in effect (meaning this test can be used) for the duration of the COVID-19 declaration under Section 564(b)(1) of the Act, 21 U.S.C. section 360bbb-3(b)(1), unless the authorization is terminated or revoked.  Performed at [facility], [address], [place], [place] [address]   MRSA Next Gen by PCR, Nasal     Status: None   Collection Time: 08/26/23  3:18 AM   Specimen: Nasal Mucosa; Nasal Swab  Result Value Ref Range Status   MRSA by PCR Next Gen NOT DETECTED NOT DETECTED Final    Comment: (NOTE) The GeneXpert MRSA Assay (FDA approved for NASAL specimens only), is one component of a comprehensive MRSA colonization surveillance program. It is not intended to diagnose MRSA infection nor to guide or monitor  treatment for MRSA infections. Test performance is not FDA approved in patients less than 34 years old. Performed at [facility],  [address] [place], [place] [address]   Culture, Respiratory w Gram Stain     Status: None   Collection Time: 08/26/23  3:20 AM   Specimen: Tracheal Aspirate; Respiratory  Result Value Ref Range Status   Specimen Description TRACHEAL ASPIRATE  Final   Special Requests NONE  Final   Gram Stain   Final    ABUNDANT WBC PRESENT, PREDOMINANTLY PMN FEW GRAM POSITIVE COCCI IN PAIRS IN CHAINS    Culture   Final    MODERATE Normal respiratory flora-no Staph aureus or Pseudomonas seen Performed at [facility], [address], [place], [place] [address]    Report Status 08/28/2023 FINAL  Final  Culture, blood (Routine X 2) w Reflex to ID Panel     Status: None   Collection Time: 08/26/23  5:32 AM   Specimen: BLOOD RIGHT HAND  Result Value Ref Range Status   Specimen Description BLOOD RIGHT HAND  Final   Special Requests   Final    BOTTLES DRAWN AEROBIC AND ANAEROBIC Blood Culture results may not be optimal due to an inadequate volume of blood received in culture bottles   Culture   Final    NO GROWTH 5 DAYS Performed at [facility], [address], [place], [place] [address]    Report Status 08/31/2023 FINAL  Final  Culture, blood (Routine X 2) w Reflex to ID Panel     Status: None   Collection Time: 08/26/23  5:35 AM   Specimen: BLOOD RIGHT ARM  Result Value Ref Range Status   Specimen Description BLOOD RIGHT ARM  Final   Special Requests   Final    BOTTLES DRAWN AEROBIC ONLY Blood Culture results may not be optimal due to an inadequate volume of blood received in culture bottles   Culture   Final    NO GROWTH 5 DAYS Performed at [facility], [address] [place], [place] [address]    Report Status 08/31/2023 FINAL  Final  Surgical PCR screen     Status: None   Collection Time: 08/30/23  2:49 PM   Specimen: Nasal Mucosa; Nasal Swab  Result Value Ref Range Status   MRSA, PCR NEGATIVE NEGATIVE Final   Staphylococcus aureus NEGATIVE NEGATIVE Final     Comment: (NOTE) The Xpert SA Assay (FDA approved for NASAL specimens in patients 26 years of age and older), is one component of a comprehensive surveillance program. It is not intended to diagnose infection nor to guide or monitor treatment. Performed at [facility], [address], [place], [place] [address]     Radiology Studies: DG CHEST PORT 1 VIEW Result Date: 09/01/2023 CLINICAL DATA:  Shortness of breath. EXAM: PORTABLE CHEST 1 VIEW COMPARISON:  August 31, 2023. FINDINGS: The heart size and mediastinal contours are within normal limits. Left-sided defibrillator is unchanged. Mild central pulmonary vascular congestion may be present. No consolidative process is noted. The visualized skeletal structures are unremarkable. IMPRESSION: Possible mild central pulmonary vascular congestion. Electronically Signed   By: Lupita Raider M.D.   On: 09/01/2023 08:16   DG Chest 2 View Result Date: 08/31/2023 CLINICAL DATA:  AICD placement. EXAM: CHEST - 2 VIEW COMPARISON:  08/27/2023 FINDINGS: Heart size is normal. Single lead AICD is seen with tip overlying the right ventricle. No pneumothorax visualized. Decreased  interstitial edema seen since previous study. No No evidence of focal consolidation or pleural effusion. IMPRESSION: Single lead AICD in expected position.  No pneumothorax visualized. Decreased pulmonary interstitial edema. Electronically Signed   By: Danae Orleans M.D.   On: 08/31/2023 10:26   Scheduled Meds:  amiodarone  400 mg Oral Daily   Chlorhexidine Gluconate Cloth  6 each Topical Daily   dalfampridine  10 mg Oral BID   feeding supplement  237 mL Oral TID BM   haloperidol lactate  5 mg Intravenous Once   heparin injection (subcutaneous)  5,000 Units Subcutaneous Q8H   insulin aspart  0-5 Units Subcutaneous QHS   insulin aspart  0-9 Units Subcutaneous TID WC   [START ON 09/02/2023] insulin glargine-yfgn  35 Units Subcutaneous Daily   levothyroxine  125 mcg Oral Q0600    polyethylene glycol  17 g Oral BID   rOPINIRole  0.25 mg Oral QHS   sacubitril-valsartan  1 tablet Oral BID   senna-docusate  2 tablet Oral BID   sodium chloride flush  3-10 mL Intravenous Q12H   spironolactone  25 mg Oral Daily   Continuous Infusions:   LOS: 6 days   Marguerita Merles, DO [facility] Available via Epic secure chat 7am-7pm After these hours, please refer to coverage provider listed on [email or web address] 09/01/2023, 5:21 PM

## 2023-09-01 NOTE — Inpatient Diabetes Management (Signed)
 Inpatient Diabetes Program Recommendations  AACE/ADA: New Consensus Statement on Inpatient Glycemic Control (2015)  Target Ranges:  Prepandial:   less than 140 mg/dL      Peak postprandial:   less than 180 mg/dL (1-2 hours)      Critically ill patients:  140 - 180 mg/dL   Lab Results  Component Value Date   GLUCAP 90 09/01/2023   HGBA1C 7.3 (H) 07/08/2023    Inpatient Diabetes Program Recommendations:   Noted hypoglycemia and CBGs decreasing post steroids. Please consider: -Decrease Semglee to 35 units daily -Add Novolog 4 units tid meal coverage if eats 50% -Decrease Novolog correction to 0-9 units tid, 0-5 units hs  Thank you, Darel Hong E. Mayeli Bornhorst, RN, MSN, CDCES  Diabetes Coordinator Inpatient Glycemic Control Team Team Pager [phone] (8am-5pm) 09/01/2023 12:09 PM

## 2023-09-01 NOTE — Evaluation (Addendum)
 Speech Language Pathology Evaluation Patient Details Name: Johnathan Ross MRN: [id number] DOB: 06/11/1970 Today's Date: 09/01/2023 Time: 1455-1510 SLP Time Calculation (min) (ACUTE ONLY): 15 min  Problem List:  Patient Active Problem List   Diagnosis Date Noted   Cardiac arrest (HCC) 08/26/2023   Encounter for screening for HIV 06/17/2023   Need for immunization against influenza 06/17/2023   Loud snoring 06/17/2023   Encounter for general adult medical examination with abnormal findings 08/02/2022   Screening for prostate cancer 08/01/2021   OAB (overactive bladder) 09/21/2020   Essential hypertension 09/21/2020   Thoracic outlet syndrome 05/03/2020   Controlled type 1 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 (HCC) 03/24/2020   Right epiretinal membrane 03/24/2020   Retinal exudates and deposits 03/24/2020   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   Nuclear sclerotic cataract of both eyes 03/24/2020   Gait disorder 03/23/2020   Benign prostatic hyperplasia without lower urinary tract symptoms 03/01/2020   Diabetic gastroparesis associated with type 1 diabetes mellitus (HCC) 03/01/2020   Gait disturbance 08/19/2019   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/19/2019   Vitamin D insufficiency 11/29/2016   Hypogonadism in male 11/01/2016   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HCC)    Hypothyroidism    Hypertension    Dyslipidemia, goal LDL below 130    MULTIPLE SCLEROSIS 08/29/2007   PROLIFERATIVE DIABETIC RETINOPATHY 08/29/2007   Past Medical History:  Past Medical History:  Diagnosis Date   Hyperlipidemia    Hypertension    Hypogonadism in male 11/01/2016   Hypothyroidism    Multiple sclerosis (HCC)    Neuromuscular disorder (HCC)    Proliferative diabetic retinopathy(362.02)    Type 1 diabetes mellitus (HCC) dx'd 1994   Vitamin D insufficiency 11/29/2016   Past Surgical History:  Past Surgical History:  Procedure Laterality Date    EYE SURGERY Bilateral    "laser for diabetic retinopathy"   FRACTURE SURGERY     IR VENO/EXT/UNI LEFT  03/24/2020   OPEN REDUCTION INTERNAL FIXATION (ORIF) TIBIA/FIBULA FRACTURE Right 10/30/2016   ORIF ANKLE FRACTURE Left 2015   ORIF TIBIA FRACTURE Right 10/30/2016   Procedure: OPEN REDUCTION INTERNAL FIXATION (ORIF) TIBIA FIBULA FRACTURE;  Surgeon: Myrene Galas, MD;  Location: [facility] OR;  Service: Orthopedics;  Laterality: Right;   RETINAL LASER PROCEDURE Bilateral    "for diabetic retinopathy"   RIGHT/LEFT HEART CATH AND CORONARY ANGIOGRAPHY N/A 08/26/2023   Procedure: RIGHT/LEFT HEART CATH AND CORONARY ANGIOGRAPHY;  Surgeon: Dorthula Nettles, DO;  Location: [facility] INVASIVE CV LAB;  Service: Cardiovascular;  Laterality: N/A;   HPI:  Patient is a 53 y.o. male with PMH: MS, HTN, DM-1, hypothyroidism, neuromuscular disorder, proliferative diabetic retinopathy. He presented to the hospital on 08/26/23 after apparently collapsing and requiring bystander CPR before EMS arrived. He was shocked eight times and given Epi x2 and Amiodarone, CPR lasted 20 minutes. He was intubated and brought to the ED. CT head and CTA chest did not reveal any intracranial abnormalities and no PE, but did show extensive consolidation throughout lungs. SLP evaluation order for cognition ordered on 08/30/23.   Assessment / Plan / Recommendation Clinical Impression  Patient presents with moderately impaired cognition as per this evaluation. His sister who was in the room told SLP that his memory has improved a lot since Friday. Patient was oriented x4 and able to recall recent therapeutic interventions from PT and OT with context cues. He participated in [place] evaluation and his score of 15 out of possible 30 places him significantly  below the normal range of 27-30. Specific impairments are in delayed recall, complex problem solving, selective attention, and awareness to deficits. When asked how he did on the testing, he didn't  indicate any perception to errors. He appeared to be more focused on his physical rehab as main impairment and goal for therapy. He was somewhat impulsive when responding to questions but did ask for repetition at times. SLP is recommending skilled intervention at the next venue to focus on memory and cognitive functioning.    SLP Assessment  SLP Recommendation/Assessment: Patient needs continued Speech Language Pathology Services SLP Visit Diagnosis: Cognitive communication deficit (R41.841)    Recommendations for follow up therapy are one component of a multi-disciplinary discharge planning process, led by the attending physician.  Recommendations may be updated based on patient status, additional functional criteria and insurance authorization.    Follow Up Recommendations  Acute inpatient rehab (3hours/day)    Assistance Recommended at Discharge  Intermittent Supervision/Assistance  Functional Status Assessment Patient has had a recent decline in their functional status and demonstrates the ability to make significant improvements in function in a reasonable and predictable amount of time.  Frequency and Duration     1x/week      SLP Evaluation Cognition  Overall Cognitive Status: Impaired/Different from baseline Arousal/Alertness: Awake/alert Orientation Level: Oriented X4 Year: 2025 Month: April Day of Week: Correct Attention: Sustained;Selective Sustained Attention: Impaired Sustained Attention Impairment: Verbal complex Selective Attention: Impaired Selective Attention Impairment: Verbal complex;Verbal basic Memory: Impaired Memory Impairment: Storage deficit;Retrieval deficit Awareness: Impaired Awareness Impairment: Emergent impairment Problem Solving: Impaired Problem Solving Impairment: Verbal complex Behaviors: Impulsive Safety/Judgment: Impaired       Comprehension  Auditory Comprehension Overall Auditory Comprehension: Appears within functional limits for  tasks assessed    Expression Expression Primary Mode of Expression: Verbal Verbal Expression Overall Verbal Expression: Impaired Naming: No impairment Pragmatics: Impairment Impairments: Abnormal affect;Monotone Non-Verbal Means of Communication: Not applicable   Oral / Motor  Oral Motor/Sensory Function Overall Oral Motor/Sensory Function: Within functional limits Motor Speech Overall Motor Speech: Appears within functional limits for tasks assessed           Angela Nevin, MA, CCC-SLP Speech Therapy

## 2023-09-02 ENCOUNTER — Telehealth: Payer: Self-pay | Admitting: Endocrinology

## 2023-09-02 ENCOUNTER — Encounter (HOSPITAL_COMMUNITY): Payer: Self-pay | Admitting: Cardiology

## 2023-09-02 DIAGNOSIS — I5082 Biventricular heart failure: Secondary | ICD-10-CM | POA: Diagnosis not present

## 2023-09-02 DIAGNOSIS — R579 Shock, unspecified: Secondary | ICD-10-CM | POA: Diagnosis not present

## 2023-09-02 DIAGNOSIS — D72829 Elevated white blood cell count, unspecified: Secondary | ICD-10-CM | POA: Diagnosis not present

## 2023-09-02 DIAGNOSIS — I469 Cardiac arrest, cause unspecified: Secondary | ICD-10-CM | POA: Diagnosis not present

## 2023-09-02 DIAGNOSIS — I5021 Acute systolic (congestive) heart failure: Secondary | ICD-10-CM | POA: Diagnosis not present

## 2023-09-02 LAB — CBC WITH DIFFERENTIAL/PLATELET
Abs Immature Granulocytes: 0.47 10*3/uL — ABNORMAL HIGH (ref 0.00–0.07)
Basophils Absolute: 0.1 10*3/uL (ref 0.0–0.1)
Basophils Relative: 0 %
Eosinophils Absolute: 0.3 10*3/uL (ref 0.0–0.5)
Eosinophils Relative: 2 %
HCT: 44.7 % (ref 39.0–52.0)
Hemoglobin: 14.7 g/dL (ref 13.0–17.0)
Immature Granulocytes: 4 %
Lymphocytes Relative: 15 %
Lymphs Abs: 1.9 10*3/uL (ref 0.7–4.0)
MCH: 28.8 pg (ref 26.0–34.0)
MCHC: 32.9 g/dL (ref 30.0–36.0)
MCV: 87.6 fL (ref 80.0–100.0)
Monocytes Absolute: 1.2 10*3/uL — ABNORMAL HIGH (ref 0.1–1.0)
Monocytes Relative: 10 %
Neutro Abs: 8.7 10*3/uL — ABNORMAL HIGH (ref 1.7–7.7)
Neutrophils Relative %: 69 %
Platelets: 249 10*3/uL (ref 150–400)
RBC: 5.1 MIL/uL (ref 4.22–5.81)
RDW: 13.7 % (ref 11.5–15.5)
WBC: 12.6 10*3/uL — ABNORMAL HIGH (ref 4.0–10.5)
nRBC: 0 % (ref 0.0–0.2)

## 2023-09-02 LAB — GLUCOSE, CAPILLARY
Glucose-Capillary: 63 mg/dL — ABNORMAL LOW (ref 70–99)
Glucose-Capillary: 87 mg/dL (ref 70–99)
Glucose-Capillary: 169 mg/dL — ABNORMAL HIGH (ref 70–99)
Glucose-Capillary: 313 mg/dL — ABNORMAL HIGH (ref 70–99)
Glucose-Capillary: 265 mg/dL — ABNORMAL HIGH (ref 70–99)
Glucose-Capillary: 314 mg/dL — ABNORMAL HIGH (ref 70–99)
Glucose-Capillary: 372 mg/dL — ABNORMAL HIGH (ref 70–99)

## 2023-09-02 LAB — COMPREHENSIVE METABOLIC PANEL WITH GFR
ALT: 44 U/L (ref 0–44)
AST: 35 U/L (ref 15–41)
Albumin: 3 g/dL — ABNORMAL LOW (ref 3.5–5.0)
Alkaline Phosphatase: 77 U/L (ref 38–126)
Anion gap: 14 (ref 5–15)
BUN: 19 mg/dL (ref 6–20)
CO2: 24 mmol/L (ref 22–32)
Calcium: 8.7 mg/dL — ABNORMAL LOW (ref 8.9–10.3)
Chloride: 99 mmol/L (ref 98–111)
Creatinine, Ser: 1.03 mg/dL (ref 0.61–1.24)
GFR, Estimated: >60 mL/min (ref >60)
Glucose, Bld: 114 mg/dL — ABNORMAL HIGH (ref 70–99)
Potassium: 4.1 mmol/L (ref 3.5–5.1)
Sodium: 137 mmol/L (ref 135–145)
Total Bilirubin: 0.6 mg/dL (ref 0.0–1.2)
Total Protein: 6 g/dL — ABNORMAL LOW (ref 6.5–8.1)

## 2023-09-02 LAB — PHOSPHORUS: Phosphorus: 3.8 mg/dL (ref 2.5–4.6)

## 2023-09-02 LAB — MAGNESIUM: Magnesium: 1.8 mg/dL (ref 1.7–2.4)

## 2023-09-02 MED ORDER — INSULIN PUMP
Freq: Three times a day (TID) | SUBCUTANEOUS | Status: DC
  Filled 2023-09-02: qty 1

## 2023-09-02 MED ORDER — METOPROLOL SUCCINATE ER 25 MG PO TB24
25.0000 mg | ORAL_TABLET | Freq: Every day | ORAL | Status: DC
  Administered 2023-09-02 – 2023-09-03 (×2): 25 mg via ORAL
  Filled 2023-09-02 (×2): qty 1

## 2023-09-02 MED ORDER — MAGNESIUM SULFATE 2 GM/50ML IV SOLN
2.0000 g | Freq: Once | INTRAVENOUS | Status: AC
  Administered 2023-09-02: 2 g via INTRAVENOUS
  Filled 2023-09-02: qty 50

## 2023-09-02 NOTE — Progress Notes (Signed)
 Physical Therapy Treatment Patient Details Name: Johnathan Ross MRN: [id number] DOB: [birth year] Today's Date: 09/02/2023   History of Present Illness 53 yo male admitted 3/31 after collapse and CPR of grossly 20 min with Vfib shock x 8. Intubated 3/31-4/1. 3/31 RHC with Rt heart failure. PMhx: HTN, DM, diabetic retinopathy, multiple sclerosis, Rt tib/fib fx    PT Comments  Patient seen in conjunction with OT for safety with ambulation efforts.  Patient able to ambulate in hallway with Johnathan Ross walker and +2 A For initiating stepping with R due to hip weakness, knee hyperextension and for compensatory techniques wearing shoe covers to allow decreased friction as initially pt unable to progress L LE with decreased R weight shift.  He is appropriate for inpatient rehab.  PT will continue to follow.     If plan is discharge home, recommend the following: A lot of help with walking and/or transfers;A lot of help with bathing/dressing/bathroom;Assist for transportation;Assistance with cooking/housework;Supervision due to cognitive status;Help with stairs or ramp for entrance   Can travel by private vehicle        Equipment Recommendations  Other (comment) (TBA)    Recommendations for Other Services       Precautions / Restrictions Precautions Precautions: Fall Recall of Precautions/Restrictions: Impaired Precaution/Restrictions Comments: MS with LB weakness Restrictions Weight Bearing Restrictions Per Provider Order: No     Mobility  Bed Mobility               General bed mobility comments: in recliner    Transfers Overall transfer level: Needs assistance Equipment used: Bilateral platform walker (eva walker) Transfers: Sit to/from Stand Sit to Stand: Mod assist, Max assist, Min assist, +2 safety/equipment           General transfer comment: heavy UE use for sit to stand and initially mod to max A for lifting help and balance while reaching up to EVA walker, then progressed  to mod A of 2 then min A.  Stand to sit pt with knees giving way and landing on edge of chair, some improvement with cues to push hips back on chair    Ambulation/Gait Ambulation/Gait assistance: Mod assist, +2 safety/equipment Gait Distance (Feet): 55 Feet Assistive device: Fara Boros Gait Pattern/deviations: Step-to pattern, Knee hyperextension - right, Knee hyperextension - left, Decreased dorsiflexion - right, Decreased dorsiflexion - left, Shuffle       General Gait Details: assist to manage Johnathan Ross walker, assist for initial step with R to unlock hyperextension and then initially for R weight shift to be able to move L.  Could not move feet initially then placed blue shoe covers to allow feet to slide   Stairs             Wheelchair Mobility     Tilt Bed    Modified Rankin (Stroke Patients Only)       Balance Overall balance assessment: Needs assistance Sitting-balance support: No upper extremity supported, Feet supported Sitting balance-Leahy Scale: Good Sitting balance - Comments: CGA when leaning forward for LB dressing   Standing balance support: Single extremity supported, No upper extremity supported Standing balance-Leahy Scale: Poor Standing balance comment: UE support in standing at sink initially, able to lift both arms up but ankles braced on chair in the back and sinking down on knees, when chair removed noted knee hyperextension and decreased stability  Communication Communication Communication: No apparent difficulties  Cognition Arousal: Alert Behavior During Therapy: WFL for tasks assessed/performed, Impulsive (mildly impulsive, cues for taking rest breaks)   PT - Cognitive impairments: Safety/Judgement                       PT - Cognition Comments: alert and seemingly oriented, needing cues for rest breaks Following commands: Intact      Cueing    Exercises Other Exercises Other Exercises:  lifting legs during moving chair to room and away from sink    General Comments General comments (skin integrity, edema, etc.): On RA, HR 90's with mobility; SpO2 98% after ambulation      Pertinent Vitals/Pain Pain Assessment Pain Assessment: Faces Faces Pain Scale: No hurt    Home Living                          Prior Function            PT Goals (current goals can now be found in the care plan section) Progress towards PT goals: Progressing toward goals    Frequency    Min 3X/week      PT Plan      Co-evaluation PT/OT/SLP Co-Evaluation/Treatment: Yes Reason for Co-Treatment: Complexity of the patient's impairments (multi-system involvement);For patient/therapist safety;To address functional/ADL transfers PT goals addressed during session: Mobility/safety with mobility;Balance;Proper use of DME;Strengthening/ROM OT goals addressed during session: ADL's and self-care;Strengthening/ROM      AM-PAC PT "6 Clicks" Mobility   Outcome Measure  Help needed turning from your back to your side while in a flat bed without using bedrails?: A Lot Help needed moving from lying on your back to sitting on the side of a flat bed without using bedrails?: A Lot Help needed moving to and from a bed to a chair (including a wheelchair)?: Total Help needed standing up from a chair using your arms (e.g., wheelchair or bedside chair)?: Total Help needed to walk in hospital room?: Total Help needed climbing 3-5 steps with a railing? : Total 6 Click Score: 8    End of Session Equipment Utilized During Treatment: Gait belt Activity Tolerance: Patient tolerated treatment well Patient left: in chair;with call bell/phone within reach;with family/visitor present   PT Visit Diagnosis: Other abnormalities of gait and mobility (R26.89);Other symptoms and signs involving the nervous system (R29.898);Muscle weakness (generalized) (M62.81)     Time: 4098-1191 PT Time Calculation  (min) (ACUTE ONLY): 43 min  Charges:    $Gait Training: 8-22 mins $Therapeutic Activity: 8-22 mins PT General Charges $$ ACUTE PT VISIT: 1 Visit                     Johnathan Ross, PT Acute Rehabilitation Services [phone] 09/02/2023    Johnathan Ross 09/02/2023, 12:54 PM

## 2023-09-02 NOTE — Telephone Encounter (Signed)
 patients sister is in the lobby about a Oncologist for the patient - patient is currently in the hospital and was advised by [employer] that they were Quarry manager for patient to this office.  [facility] confirmed that they shipped device.  Do we have one for the patient and if not can someone assist her?   Patient currently inpatient at [facility] due to Cardiac Arrest

## 2023-09-02 NOTE — Inpatient Diabetes Management (Addendum)
 Inpatient Diabetes Program Recommendations  AACE/ADA: New Consensus Statement on Inpatient Glycemic Control (2015)  Target Ranges:  Prepandial:   less than 140 mg/dL      Peak postprandial:   less than 180 mg/dL (1-2 hours)      Critically ill patients:  140 - 180 mg/dL   Lab Results  Component Value Date   GLUCAP 169 (H) 09/02/2023   HGBA1C 7.3 (H) 07/08/2023    Review of Glycemic Control  Latest Reference Range & Units 09/01/23 16:47 09/01/23 20:33 09/01/23 23:33 09/02/23 04:27 09/02/23 05:06 09/02/23 07:43  Glucose-Capillary 70 - 99 mg/dL 147 (H) 829 (H) 562 (H) 63 (L) 87 169 (H)  (H): Data is abnormally high (L): Data is abnormally low Diabetes history: Type 1 DM Outpatient Diabetes medications: Medtronic 780G- Novolog U-100 Current orders for Inpatient glycemic control: IV insulin Solumedrol 40 mg x 1   Inpatient Diabetes Program Recommendations:    Noted hypoglycemia this AM of 63 mg/dL. Insulin pump rates seem appropriate at this time. Consider insulin pump. Secure chat sent to RN to hold [facility] and MD for orders.     Insulin pump settings are as follows:   Basal insulin  12A0.1 units/hour 4A        1.15 units/hour 6A        1.20 units/hour 10P0.15 units/hour   Total daily basal insulin: 22.2 units/24 hours   Carb [place]       1:15     1 unit for every 15 grams of carbohydrates 0700    1:8       1 unit for every 8 grams of carbohydrates 1200    1:9.5    1 unit for every 9.5 grams of carbohydrates 1800    1:121 unit for every 12 grams of carbohydrates 2200    1:20     1 unit for every 20 grams of carbohydrates Insulin Sensitivity 1:35 1 unit drops blood glucose 35 mg/dl  Addendum @ 1308- Per RN Semglee administered. Secure chat sent to MD to notify that patient's received dose. Anticipate hypoglycemia; possible late afternoon. In this case, instead consider: Semglee 24 units every day and Novolog 3 units TID (Assuming patient is consuming >50% of meals).  Encouraged RN to review protocol for hypoglycemia.  @1330 : Spoke with patient briefly regarding outpatient diabetes. Patient has ordered an additional transmitter. Currently, does not have all the supplies for insulin pump restart. Reviewed glucose trends and patient states he felt he may would go low, so he consumed regular soda today. Additionally, discussed length of action of basal insulin and timing of when to place insulin pump back once family member brings supplies. Reviewed plan and he is in agreement.  Thanks, Lujean Rave, MSN, RNC-OB Diabetes Coordinator [phone] (8a-5p)

## 2023-09-02 NOTE — TOC Transition Note (Addendum)
 Transition of Care [facility] - Discharge Note   Patient Details  Name: Johnathan Ross MRN: [id number] Date of Birth: 1970/12/16  Transition of Care [facility]) CM/SW Contact:  Elliot Cousin, RN Phone Number: [phone] 09/02/2023, 7:27 PM   Clinical Narrative:    Spoke to pt and permission to speak to SO and mother. Patient scheduled dc to IP rehab pending insurance auth.    Final next level of care: IP Rehab Facility Barriers to Discharge: No Barriers Identified   Patient Goals and CMS Choice        Discharge Placement     Discharge Plan and Services Additional resources added to the After Visit Summary for     Discharge Planning Services: CM Consult Post Acute Care Choice: IP Rehab                               Social Drivers of Health (SDOH) Interventions SDOH Screenings   Food Insecurity: No Food Insecurity (08/28/2023)  Housing: Low Risk  (08/28/2023)  Transportation Needs: No Transportation Needs (08/28/2023)  Utilities: Not At Risk (08/28/2023)  Alcohol Screen: Low Risk  (09/07/2021)  Depression (PHQ2-9): Low Risk  (09/10/2022)  Financial Resource Strain: Low Risk  (09/10/2022)  Physical Activity: Sufficiently Active (09/10/2022)  Social Connections: Unknown (10/06/2021)   Received from [facility]  Recent Concern: Social Connections - Moderately Isolated (09/07/2021)  Stress: No Stress Concern Present (09/10/2022)  Tobacco Use: Low Risk  (08/29/2023)     Readmission Risk Interventions     No data to display

## 2023-09-02 NOTE — H&P (Incomplete)
 Physical Medicine and Rehabilitation Admission H&P    Chief Complaint  Patient presents with   Cardiac Arrest  : HPI: Johnathan Ross. Gallier is a 53 year old right-handed male with history of hyperlipidemia, hypertension, hypothyroidism, diabetes mellitus with proliferative diabetic retinopathy, multiple sclerosis followed by neurology service Dr. Epimenio Foot maintained on Dalfampridine.  Per chart review patient lives with girlfriend.  1 level home 4 steps to entry.  Girlfriend works outside the home.  Modified independent mobility.  Modified independent with ADLs.  Presented 08/26/2023 after collapsing in the community and receiving CPR before he was found by EMS.  Patient was found to have ventricular fibrillation.  He went through 20 minutes of ACLS protocol, was intubated and brought to the emergency room was found to be in sinus rhythm with prolonged QTc.  CT of the head and CTA of the chest did not reveal any acute abnormalities although there was some concern of aspiration in the lungs.  He remained intubated 08/26/2023-08/27/2023.  Echocardiogram showed ejection fraction of 30 to 35%.  The left ventricle demonstrating global hypokinesis.  EEG suggestive of moderate to severe diffuse encephalopathy no seizure.  Admission chemistries unremarkable except glucose 150, troponin 290-1 50, urine drug screen positive benzos.  Postarrest EKG normal sinus rhythm with low voltage in short P-RR interval.  No STE.  Cardiac MRI done showed no evidence of LGE but did show some mild to moderate decrease in left ventricular systolic function of 40% and a mild decrease in right ventricular systolic function.  Underwent cardiac catheterization showing mild nonobstructive CAD severely elevated precapillary filling pressures.  Hemodynamics consistent with suboptimal RV function.  He was started on milrinone.  Hospital course followed closely by cardiology services underwent ICD/PPM 08/30/2023.  Elevated leukocytosis 27,100 as well  as suggestion of possible aspiration placed on Rocephin per CCM.  It was felt that possibly elevated leukocytosis was due to to steroid demargination received during pacer placement.  He was cleared to begin subcutaneous heparin for DVT prophylaxis.  AKI on CKD stage III latest creatinine 1.21.  Abnormal LFTs in the setting of shock and cardiac arrest and monitored.  He is tolerating a regular diet.  Therapy evaluations completed due to patient decreased functional mobility was admitted for a comprehensive rehab program.  Review of Systems  Constitutional:  Positive for malaise/fatigue. Negative for fever.  HENT:  Negative for hearing loss.   Eyes:  Positive for blurred vision. Negative for double vision.  Respiratory:  Positive for cough and shortness of breath.   Cardiovascular:  Positive for chest pain. Negative for leg swelling.  Gastrointestinal:  Positive for constipation and nausea.  Genitourinary:  Negative for dysuria, flank pain and hematuria.  Musculoskeletal:  Positive for joint pain and myalgias.  Skin:  Negative for rash.  Neurological:  Positive for focal weakness and weakness.  All other systems reviewed and are negative.  Past Medical History:  Diagnosis Date   Hyperlipidemia    Hypertension    Hypogonadism in male 11/01/2016   Hypothyroidism    Multiple sclerosis (HCC)    Neuromuscular disorder (HCC)    Proliferative diabetic retinopathy(362.02)    Type 1 diabetes mellitus (HCC) dx'd 1994   Vitamin D insufficiency 11/29/2016   Past Surgical History:  Procedure Laterality Date   EYE SURGERY Bilateral    "laser for diabetic retinopathy"   FRACTURE SURGERY     ICD IMPLANT N/A 08/30/2023   Procedure: ICD IMPLANT;  Surgeon: Regan Lemming, MD;  Location: [facility] INVASIVE CV  LAB;  Service: Cardiovascular;  Laterality: N/A;   IR VENO/EXT/UNI LEFT  03/24/2020   OPEN REDUCTION INTERNAL FIXATION (ORIF) TIBIA/FIBULA FRACTURE Right 10/30/2016   ORIF ANKLE FRACTURE Left 2015    ORIF TIBIA FRACTURE Right 10/30/2016   Procedure: OPEN REDUCTION INTERNAL FIXATION (ORIF) TIBIA FIBULA FRACTURE;  Surgeon: Myrene Galas, MD;  Location: [facility] OR;  Service: Orthopedics;  Laterality: Right;   RETINAL LASER PROCEDURE Bilateral    "for diabetic retinopathy"   RIGHT/LEFT HEART CATH AND CORONARY ANGIOGRAPHY N/A 08/26/2023   Procedure: RIGHT/LEFT HEART CATH AND CORONARY ANGIOGRAPHY;  Surgeon: Dorthula Nettles, DO;  Location: [facility] INVASIVE CV LAB;  Service: Cardiovascular;  Laterality: N/A;   Family History  Problem Relation Age of Onset   Diabetes Sister    Colon polyps Maternal Uncle    Colon cancer Maternal Grandmother    Stomach cancer Cousin    Esophageal cancer Neg Hx    Social History:  reports that he has never smoked. He has never used smokeless tobacco. He reports that he does not drink alcohol and does not use drugs. Allergies:  Allergies  Allergen Reactions   Zestril [Lisinopril] Cough   Ivp Dye [Iodinated Contrast Media] Hives and Itching   Medications Prior to Admission  Medication Sig Dispense Refill   baclofen (LIORESAL) 10 MG tablet Take 1 tablet (10 mg total) by mouth 3 (three) times daily. 270 tablet 3   dalfampridine 10 MG TB12 Take 1 tablet (10 mg total) by mouth 2 (two) times daily. 180 tablet 3   levocetirizine (XYZAL) 5 MG tablet TAKE 1 TABLET BY MOUTH EVERY DAY IN THE EVENING 90 tablet 1   losartan (COZAAR) 50 MG tablet Take 1 tablet (50 mg total) by mouth daily. 90 tablet 3   NOVOLOG 100 UNIT/ML injection USE UP TO 80 UNITS IN INSULIN PUMP DAILY-DX CODE E10.8 70 mL 2   simvastatin (ZOCOR) 20 MG tablet Take 1 tablet (20 mg total) by mouth daily at 6 PM. 90 tablet 0   SYNTHROID 125 MCG tablet Take 1 tablet by mouth 6 days per week. 78 tablet 3      Home: Home Living Family/patient expects to be discharged to:: Inpatient rehab Living Arrangements:  (girlfriend of > 20 years) Available Help at Discharge: Family Type of Home: House Home Access:  Stairs to enter Secretary/administrator of Steps: 4 Entrance Stairs-Rails: Left, Right, Can reach both Home Layout: One level Bathroom Shower/Tub: Engineer, manufacturing systems: Standard Bathroom Accessibility: Yes Home Equipment: Agricultural consultant (2 wheels), [employer] - single point  Lives With: Significant other   Functional History: Prior Function Prior Level of Function : Independent/Modified Independent, Driving Mobility Comments: walks with furniture or walls at home, RW for long distance; pt and family repot falls in the past, but not recently ADLs Comments: Independent to Mod I with ADls and IADLs; drives; enjoys drawing  Functional Status:  Mobility: Bed Mobility Overal bed mobility: Needs Assistance Bed Mobility: Supine to Sit Supine to sit: Mod assist, HOB elevated General bed mobility comments: in recliner Transfers Overall transfer level: Needs assistance Equipment used: Bilateral platform walker (eva walker) Transfers: Sit to/from Stand Sit to Stand: Mod assist, Max assist, Min assist, +2 safety/equipment Bed to/from chair/wheelchair/BSC transfer type:: Via Lift equipment Transfer via Lift Equipment: Stedy General transfer comment: heavy UE use for sit to stand and initially mod to max A for lifting help and balance while reaching up to EVA walker, then progressed to mod A of 2 then min A.  Stand to sit pt with knees giving way and landing on edge of chair, some improvement with cues to push hips back on chair Ambulation/Gait Ambulation/Gait assistance: Mod assist, +2 safety/equipment Gait Distance (Feet): 55 Feet Assistive device: Fara Boros Gait Pattern/deviations: Step-to pattern, Knee hyperextension - right, Knee hyperextension - left, Decreased dorsiflexion - right, Decreased dorsiflexion - left, Shuffle General Gait Details: assist to manage [name] walker, assist for initial step with R to unlock hyperextension and then initially for R weight shift to be able to move L.   Could not move feet initially then placed blue shoe covers to allow feet to slide Gait velocity: decr    ADL: ADL Overall ADL's : Needs assistance/impaired Eating/Feeding: Independent, Sitting Grooming: Oral care, Set up, Sitting Upper Body Bathing: Minimal assistance, Moderate assistance, Bed level, Cueing for compensatory techniques Upper Body Bathing Details (indicate cue type and reason): with assist for line management Lower Body Bathing: Maximal assistance, Bed level, Cueing for compensatory techniques, +2 for physical assistance, +2 for safety/equipment Upper Body Dressing : Maximal assistance, Sitting Upper Body Dressing Details (indicate cue type and reason): front opening gown Lower Body Dressing: Contact guard assist, Sitting/lateral leans Lower Body Dressing Details (indicate cue type and reason): doffed shoe covers in sitting with increased time Toilet Transfer Details (indicate cue type and reason): not attempted this session for pt/therapist safety Toileting- Clothing Manipulation and Hygiene: Total assistance, +2 for physical assistance, +2 for safety/equipment, Bed level Functional mobility during ADLs: Moderate assistance, +2 for physical assistance [name] walker) General ADL Comments: Worked on sit to stand at sink, static standing balance at sink reaching with each UE singly. Unable to balance in static standing without at lease one hand support.  Cognition: Cognition Overall Cognitive Status: Impaired/Different from baseline Arousal/Alertness: Awake/alert Orientation Level: Oriented X4 Year: 2025 Month: April Day of Week: Correct Attention: Sustained, Selective Sustained Attention: Impaired Sustained Attention Impairment: Verbal complex Selective Attention: Impaired Selective Attention Impairment: Verbal complex, Verbal basic Memory: Impaired Memory Impairment: Storage deficit, Retrieval deficit Awareness: Impaired Awareness Impairment: Emergent  impairment Problem Solving: Impaired Problem Solving Impairment: Verbal complex Behaviors: Impulsive Safety/Judgment: Impaired Cognition Arousal: Alert Behavior During Therapy: Impulsive (mildly) Overall Cognitive Status: Impaired/Different from baseline  Physical Exam: Blood pressure 128/82, pulse 82, temperature 98.7 F (37.1 C), temperature source Oral, resp. rate 16, height 6\' 1"  (1.854 m), weight 100.9 kg, SpO2 96%. Physical Exam Neurological:     Comments: Patient is alert makes eye contact with examiner.  Provides name and age but some disorientation to time and place.  He did have some difficulty recalling his hospital course.     Results for orders placed or performed during the hospital encounter of 08/26/23 (from the past 48 hours)  Glucose, capillary     Status: Abnormal   Collection Time: 08/31/23  3:49 PM  Result Value Ref Range   Glucose-Capillary 287 (H) 70 - 99 mg/dL    Comment: Glucose reference range applies only to samples taken after fasting for at least 8 hours.  Glucose, capillary     Status: Abnormal   Collection Time: 08/31/23  8:15 PM  Result Value Ref Range   Glucose-Capillary 231 (H) 70 - 99 mg/dL    Comment: Glucose reference range applies only to samples taken after fasting for at least 8 hours.  Glucose, capillary     Status: Abnormal   Collection Time: 08/31/23 11:24 PM  Result Value Ref Range   Glucose-Capillary 119 (H) 70 - 99 mg/dL  Comment: Glucose reference range applies only to samples taken after fasting for at least 8 hours.  CBC with Differential/Platelet     Status: Abnormal   Collection Time: 09/01/23  4:08 AM  Result Value Ref Range   WBC 22.1 (H) 4.0 - 10.5 K/uL   RBC 4.67 4.22 - 5.81 MIL/uL   Hemoglobin 13.4 13.0 - 17.0 g/dL   HCT 16.1 09.6 - 04.5 %   MCV 85.9 80.0 - 100.0 fL   MCH 28.7 26.0 - 34.0 pg   MCHC 33.4 30.0 - 36.0 g/dL   RDW 40.9 81.1 - 91.4 %   Platelets 256 150 - 400 K/uL   nRBC 0.0 0.0 - 0.2 %   Neutrophils  Relative % 80 %   Neutro Abs 17.8 (H) 1.7 - 7.7 K/uL   Lymphocytes Relative 9 %   Lymphs Abs 2.0 0.7 - 4.0 K/uL   Monocytes Relative 9 %   Monocytes Absolute 2.0 (H) 0.1 - 1.0 K/uL   Eosinophils Relative 0 %   Eosinophils Absolute 0.0 0.0 - 0.5 K/uL   Basophils Relative 0 %   Basophils Absolute 0.1 0.0 - 0.1 K/uL   Immature Granulocytes 2 %   Abs Immature Granulocytes 0.35 (H) 0.00 - 0.07 K/uL    Comment: Performed at [facility], [address], [place], [place] [address]  Comprehensive metabolic panel with GFR     Status: Abnormal   Collection Time: 09/01/23  4:08 AM  Result Value Ref Range   Sodium 139 135 - 145 mmol/L   Potassium 3.9 3.5 - 5.1 mmol/L   Chloride 102 98 - 111 mmol/L   CO2 28 22 - 32 mmol/L   Glucose, Bld 52 (L) 70 - 99 mg/dL    Comment: Glucose reference range applies only to samples taken after fasting for at least 8 hours.   BUN 23 (H) 6 - 20 mg/dL   Creatinine, Ser 6.21 0.61 - 1.24 mg/dL   Calcium 8.6 (L) 8.9 - 10.3 mg/dL   Total Protein 5.4 (L) 6.5 - 8.1 g/dL   Albumin 2.7 (L) 3.5 - 5.0 g/dL   AST 32 15 - 41 U/L   ALT 40 0 - 44 U/L   Alkaline Phosphatase 76 38 - 126 U/L   Total Bilirubin 0.7 0.0 - 1.2 mg/dL   GFR, Estimated >30 >86 mL/min    Comment: (NOTE) Calculated using the CKD-EPI Creatinine Equation (2021)    Anion gap 9 5 - 15    Comment: Performed at [facility], [address] [place], [place] [address]  Phosphorus     Status: None   Collection Time: 09/01/23  4:08 AM  Result Value Ref Range   Phosphorus 4.5 2.5 - 4.6 mg/dL    Comment: Performed at [facility], [address], [place], [place] [address]  Magnesium     Status: None   Collection Time: 09/01/23  4:08 AM  Result Value Ref Range   Magnesium 1.9 1.7 - 2.4 mg/dL    Comment: Performed at [facility], [address], [place], [place] [address]  Glucose, capillary     Status: Abnormal   Collection Time: 09/01/23  4:34 AM  Result Value Ref Range    Glucose-Capillary 49 (L) 70 - 99 mg/dL    Comment: Glucose reference range applies only to samples taken after fasting for at least 8 hours.  Glucose, capillary     Status: Abnormal   Collection Time:  09/01/23  4:36 AM  Result Value Ref Range   Glucose-Capillary 40 (LL) 70 - 99 mg/dL    Comment: Glucose reference range applies only to samples taken after fasting for at least 8 hours.   Comment 1 Notify RN   Glucose, capillary     Status: Abnormal   Collection Time: 09/01/23  5:14 AM  Result Value Ref Range   Glucose-Capillary 150 (H) 70 - 99 mg/dL    Comment: Glucose reference range applies only to samples taken after fasting for at least 8 hours.  Glucose, capillary     Status: None   Collection Time: 09/01/23  7:33 AM  Result Value Ref Range   Glucose-Capillary 90 70 - 99 mg/dL    Comment: Glucose reference range applies only to samples taken after fasting for at least 8 hours.  Glucose, capillary     Status: Abnormal   Collection Time: 09/01/23 12:05 PM  Result Value Ref Range   Glucose-Capillary 224 (H) 70 - 99 mg/dL    Comment: Glucose reference range applies only to samples taken after fasting for at least 8 hours.  Glucose, capillary     Status: Abnormal   Collection Time: 09/01/23  1:43 PM  Result Value Ref Range   Glucose-Capillary 190 (H) 70 - 99 mg/dL    Comment: Glucose reference range applies only to samples taken after fasting for at least 8 hours.  Glucose, capillary     Status: Abnormal   Collection Time: 09/01/23  4:47 PM  Result Value Ref Range   Glucose-Capillary 374 (H) 70 - 99 mg/dL    Comment: Glucose reference range applies only to samples taken after fasting for at least 8 hours.  Glucose, capillary     Status: Abnormal   Collection Time: 09/01/23  8:33 PM  Result Value Ref Range   Glucose-Capillary 291 (H) 70 - 99 mg/dL    Comment: Glucose reference range applies only to samples taken after fasting for at least 8 hours.  Glucose, capillary     Status:  Abnormal   Collection Time: 09/01/23 11:33 PM  Result Value Ref Range   Glucose-Capillary 145 (H) 70 - 99 mg/dL    Comment: Glucose reference range applies only to samples taken after fasting for at least 8 hours.  Glucose, capillary     Status: Abnormal   Collection Time: 09/02/23  4:27 AM  Result Value Ref Range   Glucose-Capillary 63 (L) 70 - 99 mg/dL    Comment: Glucose reference range applies only to samples taken after fasting for at least 8 hours.  Glucose, capillary     Status: None   Collection Time: 09/02/23  5:06 AM  Result Value Ref Range   Glucose-Capillary 87 70 - 99 mg/dL    Comment: Glucose reference range applies only to samples taken after fasting for at least 8 hours.  CBC with Differential/Platelet     Status: Abnormal   Collection Time: 09/02/23  5:20 AM  Result Value Ref Range   WBC 12.6 (H) 4.0 - 10.5 K/uL   RBC 5.10 4.22 - 5.81 MIL/uL   Hemoglobin 14.7 13.0 - 17.0 g/dL   HCT 16.1 09.6 - 04.5 %   MCV 87.6 80.0 - 100.0 fL   MCH 28.8 26.0 - 34.0 pg   MCHC 32.9 30.0 - 36.0 g/dL   RDW 40.9 81.1 - 91.4 %   Platelets 249 150 - 400 K/uL   nRBC 0.0 0.0 - 0.2 %   Neutrophils Relative %  69 %   Neutro Abs 8.7 (H) 1.7 - 7.7 K/uL   Lymphocytes Relative 15 %   Lymphs Abs 1.9 0.7 - 4.0 K/uL   Monocytes Relative 10 %   Monocytes Absolute 1.2 (H) 0.1 - 1.0 K/uL   Eosinophils Relative 2 %   Eosinophils Absolute 0.3 0.0 - 0.5 K/uL   Basophils Relative 0 %   Basophils Absolute 0.1 0.0 - 0.1 K/uL   Immature Granulocytes 4 %   Abs Immature Granulocytes 0.47 (H) 0.00 - 0.07 K/uL    Comment: Performed at [facility], [address], [place], [place] [address]  Comprehensive metabolic panel with GFR     Status: Abnormal   Collection Time: 09/02/23  5:20 AM  Result Value Ref Range   Sodium 137 135 - 145 mmol/L   Potassium 4.1 3.5 - 5.1 mmol/L   Chloride 99 98 - 111 mmol/L   CO2 24 22 - 32 mmol/L   Glucose, Bld 114 (H) 70 - 99 mg/dL    Comment: Glucose reference  range applies only to samples taken after fasting for at least 8 hours.   BUN 19 6 - 20 mg/dL   Creatinine, Ser 1.32 0.61 - 1.24 mg/dL   Calcium 8.7 (L) 8.9 - 10.3 mg/dL   Total Protein 6.0 (L) 6.5 - 8.1 g/dL   Albumin 3.0 (L) 3.5 - 5.0 g/dL   AST 35 15 - 41 U/L   ALT 44 0 - 44 U/L   Alkaline Phosphatase 77 38 - 126 U/L   Total Bilirubin 0.6 0.0 - 1.2 mg/dL   GFR, Estimated >44 >01 mL/min    Comment: (NOTE) Calculated using the CKD-EPI Creatinine Equation (2021)    Anion gap 14 5 - 15    Comment: Performed at [facility], [address] [place], [place] [address]  Phosphorus     Status: None   Collection Time: 09/02/23  5:20 AM  Result Value Ref Range   Phosphorus 3.8 2.5 - 4.6 mg/dL    Comment: Performed at [facility], [address] [place], [place] [address]  Magnesium     Status: None   Collection Time: 09/02/23  5:20 AM  Result Value Ref Range   Magnesium 1.8 1.7 - 2.4 mg/dL    Comment: Performed at [facility], [address], [place], [place] [address]  Glucose, capillary     Status: Abnormal   Collection Time: 09/02/23  7:43 AM  Result Value Ref Range   Glucose-Capillary 169 (H) 70 - 99 mg/dL    Comment: Glucose reference range applies only to samples taken after fasting for at least 8 hours.  Glucose, capillary     Status: Abnormal   Collection Time: 09/02/23 10:01 AM  Result Value Ref Range   Glucose-Capillary 313 (H) 70 - 99 mg/dL    Comment: Glucose reference range applies only to samples taken after fasting for at least 8 hours.  Glucose, capillary     Status: Abnormal   Collection Time: 09/02/23 12:37 PM  Result Value Ref Range   Glucose-Capillary 265 (H) 70 - 99 mg/dL    Comment: Glucose reference range applies only to samples taken after fasting for at least 8 hours.   *Note: Due to a large number of results and/or encounters for the requested time period, some results have not been displayed. A complete set of results can be found in  Results Review.   DG CHEST PORT 1 VIEW  Result Date: 09/01/2023 CLINICAL DATA:  Shortness of breath. EXAM: PORTABLE CHEST 1 VIEW COMPARISON:  August 31, 2023. FINDINGS: The heart size and mediastinal contours are within normal limits. Left-sided defibrillator is unchanged. Mild central pulmonary vascular congestion may be present. No consolidative process is noted. The visualized skeletal structures are unremarkable. IMPRESSION: Possible mild central pulmonary vascular congestion. Electronically Signed   By: Lupita Raider M.D.   On: 09/01/2023 08:16      Blood pressure 128/82, pulse 82, temperature 98.7 F (37.1 C), temperature source Oral, resp. rate 16, height 6\' 1"  (1.854 m), weight 100.9 kg, SpO2 96%.  Medical Problem List and Plan: 1. Functional deficits secondary to cardiac arrest/V-fib/V. tach with post arrest encephalopathy  -patient may *** shower  -ELOS/Goals: *** 2.  Antithrombotics: -DVT/anticoagulation:  Pharmaceutical: Heparin  -antiplatelet therapy: N/A 3. Pain Management: Tylenol as needed 4. Mood/Behavior/Sleep: Provide emotional support  -antipsychotic agents: N/A 5. Neuropsych/cognition: This patient is not capable of making decisions on his own behalf. 6. Skin/Wound Care: Routine skin checks 7. Fluids/Electrolytes/Nutrition: Routine in and outs with follow-up chemistries 8.  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  Status post ICD 08/30/2023.  Amiodarone 400 mg daily, Toprol-XL 25 mg daily, Entresto 49-51 mg twice daily, Aldactone 25 mg daily 9.  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  Latest hemoglobin A1c 7.3.  Semglee 35 units daily. 10.  Hypothyroidism.  Synthroid 11.  History of multiple sclerosis.  Followed by neurology services Dr. Epimenio Foot..  Continue Dalfampridine 10 mg twice daily as well as Requip for restless legs 12.  AKI on CKD stage III.  Follow-up chemistries   Charlton Amor,  PA-C 09/02/2023

## 2023-09-02 NOTE — Progress Notes (Signed)
 Occupational Therapy Treatment Patient Details Name: Johnathan Ross MRN: [id number] DOB: 1971/04/26 Today's Date: 09/02/2023   History of present illness 53 yo male admitted 3/31 after collapse and CPR of grossly 20 min with Vfib shock x 8. Intubated 3/31-4/1. 3/31 RHC with Rt heart failure. PMhx: HTN, DM, diabetic retinopathy, multiple sclerosis, Rt tib/fib fx   OT comments  Pt with excellent effort, highly motivated to return independence and home. Parents in room and encouraging pt. Pt demonstrated improved sit<>stand, initially requiring +2 max assist and progressed to min assist by end of session. Ambulated in hall with +2 assist and Johnathan Ross walker with chair follow. Pt needing cues to monitor for fatigue and need for rest breaks. Pt able to stand statically at sink propping on one UE while reaching with the other. Doffed shoe covers with CGA in sitting, no LOB. VS stable and monitored throughout session. Pt continues to be an excellent rehab candidate. Patient will benefit from intensive inpatient follow-up therapy, >3 hours/day.      If plan is discharge home, recommend the following:  Two people to help with walking and/or transfers;Assistance with cooking/housework;Direct supervision/assist for medications management;Direct supervision/assist for financial management;Assist for transportation;Help with stairs or ramp for entrance;A lot of help with bathing/dressing/bathroom   Equipment Recommendations  Other (comment) (defer to next venue of care)    Recommendations for Other Services      Precautions / Restrictions Precautions Precautions: Fall Restrictions Weight Bearing Restrictions Per Provider Order: No Other Position/Activity Restrictions: AFOs in room, no shoes       Mobility Bed Mobility               General bed mobility comments: in recliner    Transfers Overall transfer level: Needs assistance Equipment used: Bilateral platform walker Johnathan Ross  walker) Transfers: Sit to/from Stand Sit to Stand: Mod assist, Max assist, Min assist, +2 safety/equipment           General transfer comment: Good technique with heavy reliance on UEs. Initially requring +2 max assist and progressed to min assist +2 for safety by end of session. Improved control of descent to sitting from evaluation.     Balance Overall balance assessment: Needs assistance   Sitting balance-Leahy Scale: Good Sitting balance - Comments: CGA when leaning forward for LB dressing     Standing balance-Leahy Scale: Poor Standing balance comment: reliant on RW and external support                           ADL either performed or assessed with clinical judgement   ADL Overall ADL's : Needs assistance/impaired Eating/Feeding: Independent;Sitting   Grooming: Oral care;Set up;Sitting               Lower Body Dressing: Contact guard assist;Sitting/lateral leans Lower Body Dressing Details (indicate cue type and reason): doffed shoe covers in sitting with increased time             Functional mobility during ADLs: Moderate assistance;+2 for physical assistance Johnathan Ross walker) General ADL Comments: Worked on sit to stand at sink, static standing balance at sink reaching with each UE singly. Unable to balance in static standing without at lease one hand support.    Extremity/Trunk Assessment              Vision       Perception     Praxis     Communication Communication Communication: No apparent difficulties   Cognition  Arousal: Alert Behavior During Therapy: Impulsive (mildly)       Awareness: Online awareness impaired       OT - Cognition Comments: pt needing cues to monitor for fatigue and need for rest breaks                 Following commands: Intact        Cueing   Cueing Techniques: Verbal cues  Exercises      Shoulder Instructions       General Comments      Pertinent Vitals/ Pain       Pain  Assessment Pain Assessment: Faces Faces Pain Scale: No hurt  Home Living                                          Prior Functioning/Environment              Frequency  Min 2X/week        Progress Toward Goals  OT Goals(current goals can now be found in the care plan section)  Progress towards OT goals: Progressing toward goals  Acute Rehab OT Goals OT Goal Formulation: With patient/family Time For Goal Achievement: 09/11/23 Potential to Achieve Goals: Good  Plan      Co-evaluation    PT/OT/SLP Co-Evaluation/Treatment: Yes Reason for Co-Treatment: Complexity of the patient's impairments (multi-system involvement);For patient/therapist safety;To address functional/ADL transfers PT goals addressed during session: Mobility/safety with mobility;Balance;Proper use of DME;Strengthening/ROM OT goals addressed during session: ADL's and self-care;Strengthening/ROM      AM-PAC OT "6 Clicks" Daily Activity     Outcome Measure   Help from another person eating meals?: None Help from another person taking care of personal grooming?: A Little Help from another person toileting, which includes using toliet, bedpan, or urinal?: Total Help from another person bathing (including washing, rinsing, drying)?: A Lot Help from another person to put on and taking off regular upper body clothing?: A Little Help from another person to put on and taking off regular lower body clothing?: A Lot 6 Click Score: 15    End of Session Equipment Utilized During Treatment: Gait belt;Other (comment) Johnathan Ross walker)  OT Visit Diagnosis: Unsteadiness on feet (R26.81);Muscle weakness (generalized) (M62.81);Other symptoms and signs involving cognitive function   Activity Tolerance Patient tolerated treatment well   Patient Left in chair;with call bell/phone within reach;with family/visitor present   Nurse Communication          Time: 4098-1191 OT Time Calculation (min): 31  min  Charges: OT General Charges $OT Visit: 1 Visit OT Treatments $Self Care/Home Management : 8-22 mins  Berna Spare, OTR/L Acute Rehabilitation Services Office: [phone]   Evern Bio 09/02/2023, 12:40 PM

## 2023-09-02 NOTE — Progress Notes (Signed)
 Advanced Heart Failure Rounding Note  Cardiologist: Dr. Gasper Lloyd (new)  Chief Complaint: Out of hospital VF Arrest   Patient Profile  Johnathan Ross is a 53 y.o. male with a hx of DM1, hypothyroidism, HTN, multiple sclerosis, hyperlipidemia admitted for OOH VT/VF arrest.  Pertinent Data:   Shocked 8 times, given epinephrine x2 and amiodarone. Intubated.   Initial K 3.3, Mg 2.0  Hs trop 290>>150 LA 5.5>>2.2 >>4.1 Post arrest EKG NSR w/ low voltage and short P-R interval. No STE. QTc ok 367 ms. UDS + Benzos  Echo EF 30-35%, no LVH. RV mildly reduced  R/LHC: Mild nonobstructive CAD, severely elevated pre capillary filling pressures. Hemodynamics (PAPi, CVP/PCWP ratio) consistent with suboptimal RV function >>started on Milrinone 0.25 mcg/kg/min Ost LAD to Prox LAD lesion is 30% stenosed.   HEMODYNAMICS:. RA:                  12 mmHg (mean) RV:                  35/10-14 mmHg PA:                  38/20 mmHg (26 mean) PCWP:            14 mmHg (mean)                         Estimated Fick CO/CI: 4.3 L/min, 1.94 L/min/m2   Thermodilution CO/CI: 4.39 L/min, 1.98 L/min/m2                             TPG                 12  mmHg                                            PVR                 2.8 Wood Units  PAPi                1.5   LVEDP: 18  EEG 3/31: moderate to severe diffuse encephalopathy. No seizures or epileptiform discharges   cMRI 4/3 with LVEF 40%, no RWMA, GHK, RVEF 45%, no significant regurgitation seen in all valves. No pericardial effusion  Subjective:    Family at bedside and daughter on the phone. Feels good this morning. Denies CP/SOB.   Objective:   Weight Range: 100.9 kg Body mass index is 29.35 kg/m.   Vital Signs:   Temp:  [98 F (36.7 C)-98.6 F (37 C)] 98.5 F (36.9 C) (04/07 0745) Pulse Rate:  [69-87] 87 (04/07 0745) Resp:  [16-18] 16 (04/07 0745) BP: (109-126)/(68-77) 117/77 (04/07 0745) SpO2:  [96 %-100 %] 97 % (04/07 0745) Weight:  [100.9  kg] 100.9 kg (04/07 0423) Last BM Date : 08/31/23 (pt states he does not feel constipated and does not want meds for it at this time)  Weight change: Filed Weights   08/31/23 0409 09/01/23 0500 09/02/23 0423  Weight: 99.7 kg 99.3 kg 100.9 kg   Intake/Output:   Intake/Output Summary (Last 24 hours) at 09/02/2023 1104 Last data filed at 09/02/2023 0952 Gross per 24 hour  Intake 1680 ml  Output 3225 ml  Net -1545 ml    Physical  Exam  General:  well appearing, sitting up in chair.  No respiratory difficulty Neck: supple. JVD flat.  Cor: PMI nondisplaced. Regular rate & rhythm. No rubs, gallops or murmurs. Lungs: clear Extremities: no cyanosis, clubbing, rash, edema  Neuro: alert & oriented x 3. Affect pleasant  Telemetry   NSR 70s + PVCs (Personally reviewed)    EKG    No new EKG to review   Labs    CBC Recent Labs    09/01/23 0408 09/02/23 0520  WBC 22.1* 12.6*  NEUTROABS 17.8* 8.7*  HGB 13.4 14.7  HCT 40.1 44.7  MCV 85.9 87.6  PLT 256 249   Basic Metabolic Panel Recent Labs    40/98/11 0408 09/02/23 0520  NA 139 137  K 3.9 4.1  CL 102 99  CO2 28 24  GLUCOSE 52* 114*  BUN 23* 19  CREATININE 1.21 1.03  CALCIUM 8.6* 8.7*  MG 1.9 1.8  PHOS 4.5 3.8   Liver Function Tests Recent Labs    09/01/23 0408 09/02/23 0520  AST 32 35  ALT 40 44  ALKPHOS 76 77  BILITOT 0.7 0.6  PROT 5.4* 6.0*  ALBUMIN 2.7* 3.0*   No results for input(s): "LIPASE", "AMYLASE" in the last 72 hours. Cardiac Enzymes No results for input(s): "CKTOTAL", "CKMB", "CKMBINDEX", "TROPONINI" in the last 72 hours.   BNP: BNP (last 3 results) Recent Labs    08/26/23 0645  BNP 102.9*    ProBNP (last 3 results) No results for input(s): "PROBNP" in the last 8760 hours.   D-Dimer No results for input(s): "DDIMER" in the last 72 hours.  Hemoglobin A1C No results for input(s): "HGBA1C" in the last 72 hours. Fasting Lipid Panel No results for input(s): "CHOL", "HDL", "LDLCALC",  "TRIG", "CHOLHDL", "LDLDIRECT" in the last 72 hours.  Thyroid Function Tests No results for input(s): "TSH", "T4TOTAL", "T3FREE", "THYROIDAB" in the last 72 hours.  Invalid input(s): "FREET3"   Other results:   Imaging    No results found.    Medications:     Scheduled Medications:  amiodarone  400 mg Oral Daily   Chlorhexidine Gluconate Cloth  6 each Topical Daily   dalfampridine  10 mg Oral BID   feeding supplement  237 mL Oral TID BM   haloperidol lactate  5 mg Intravenous Once   heparin injection (subcutaneous)  5,000 Units Subcutaneous Q8H   insulin aspart  0-5 Units Subcutaneous QHS   insulin aspart  0-9 Units Subcutaneous TID WC   insulin glargine-yfgn  35 Units Subcutaneous Daily   levothyroxine  125 mcg Oral Q0600   polyethylene glycol  17 g Oral BID   rOPINIRole  0.25 mg Oral QHS   sacubitril-valsartan  1 tablet Oral BID   senna-docusate  2 tablet Oral BID   sodium chloride flush  3-10 mL Intravenous Q12H   spironolactone  25 mg Oral Daily    Infusions:  magnesium sulfate bolus IVPB      PRN Medications: acetaminophen, diazepam, fentaNYL, gadobutrol, magic mouthwash w/lidocaine, ondansetron (ZOFRAN) IV, mouth rinse, sodium chloride flush  Assessment/Plan  1. OOH VT/VF Arrest - ~ 20 min bystander CPR. ROSC after multiple defibs, Epi + amio  - no STE on post arrest EKG. Noted short PR interval, low voltage. QTc 367 ms - initial K 3.0, Mg 2.0>>given K supp  - Hs trop 290>>150 - Echo EF 30-35%, no LVH. RV mildly reduced  - R/LHC nonobs CAD - Continue PO amio per EP - Keep K > 4.0,  Mg > 2.0  - Now s/p ICD placement 08/30/23  2. 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 RV mildly reduced. No prior study for comparison  - uncertain if baseline CM that triggered VT/VF vs cardiac stunning post arrest  - R/LHC nonobs CAD, suboptimal RV fx w/ low output (RA 12, PAP 38/20, PCW 14 CI 1.9) - cMRI 4/3  with LVEF 40%, no RWMA, GHK, RVEF 45%, no significant regurgitation seen in all valves. No pericardial effusion - Now s/p ICD placement - Stable off milrinone - Continue Entresto 49-51 mg bid - Continue spironolactone 25 mg daily  - Start Toprol-XL 25 mg daily - Appears euvolemic does not need additional diuretic at this time - Hold off on SGLT2i with limited mobility - Will need genetic testing when seen OP  3. Vasoplegic/ Septic Shock - shock resolved. BP stable off pressors  - initial PCT 21, WBCs downtrending  - ? Aspiration. On rocephin per CCM  - Bcx NGTD   4. Acute Hypoxic Respiratory Failure - intubated post arrest, now extubated>> RA - abx for suspected aspiration PNA    Plan for CIR. Stable from AHF standpoint.  Will arrange f/u in AHF clinic.   Continue current meds in CIR.   Length of Stay: 7  Alen Bleacher, NP  09/02/2023, 11:04 AM  Advanced Heart Failure Team Pager [phone] (M-F; 7a - 5p)  Please contact [facility] for night-coverage after hours (5p -7a ) and weekends on [email or web address]

## 2023-09-02 NOTE — Telephone Encounter (Signed)
 Package provided to pts sister.

## 2023-09-02 NOTE — Progress Notes (Signed)
 PROGRESS NOTE    Johnathan Ross  ZOX:[id number] DOB: 1971/03/29 DOA: 08/26/2023 PCP: Etta Grandchild, MD   Brief Narrative:  The patient is a 53 year old overweight African-American male with a past medical history significant for relapsing remitting MS on [place], hyperlipidemia, hypertension, hypothyroidism, diabetes mellitus type 1 as well as other comorbidities who presented to the ED after collapsing requiring bystander CPR and is found to be in V-fib V. tach arrest.  CPR lasted about 20 minutes and he was received shocked 8 times and epi x 2 and amiodarone.  He is brought to the ED no is normal sinus rhythm and required peripheral neuropathy.  No PE was done and is admitted to the critical care service.  Cardiology evaluated and patient was found to have a mixed cardiogenic and septic shock on admission which is now improved.  Further workup also done and showed acute biventricular systolic CHF with right ventricular dominant shock.  EP and advanced heart failure team consulted.  He was transferred to [facility] service on 08/30/2023.  He underwent further workup and is s/p ICD placement and now being transitioned from IV Amiodarone to oral Amiodarone on 08/31/23. He is medically stable for D/C to CIR but awaiting English as a second language teacher.  Assessment and Plan:  Witnessed out of hospital Vfib/VTach arrest x 20 min intermittent-ROSC after multiple defibrillations and bystander CPR" had received epi and amnio trops flat, EF down to 30-35%, query primary arrythmia and new cardiomyopathy; On Amio as on Milrinone gtt per AHF team along with Diuresis and GDMT. Cardiac MRI done and getting showed no evidence of LGE as below and also did show mild to moderate decrease in left ventricular systolic function of 40% and the mild decrease in right ventricular systolic function with RV of 45%. He is s/p ICD placement and being transitioned from IV Amiodarone to oral 400 mg po Daily  PT OT recommending acute inpatient  rehabilitation and is medically stable but awaiting English as a second language teacher.   Mix cardiogenic and septic (aspiration), improved: Septic shock is resolved and he is blood pressure stable off pressors.  WBC continue to trend and he has received his short course of antibiotics and completed with IV ceftriaxone.  Blood cultures showing no growth to date. PCT was 21.78 on last check last week. WBC trending down.   Acute biventricular Systolic Heart Failure with Right Ventricular dominant shock Further care per Cardiology.  BNP was 102.9 on admission he is on now on Entresto 49/51 twice daily, spironolactone 12 5 mg p.o. daily, off of milrinone is Co-Ox stable.  Central line has been removed.  No evidence of late gadolinium enhancement in the left ventricular myocardium and had normal parametric mapping and tissue characterization on the cardiac MRI and the cardiologist feels that it is hopefully dysfunction postarrest stunning.  Received ICD 08/30/23. Deferring initiation of SGLT2 inhibitor to Cardiology. Stable from a Heart failure perspective and they have signed off and will obtain Genetic testing in the outpt setting  Post arrest Encephalopathy: Had Some lingering delirium but overall improved and appears baseline; has Haloperidol 5 mg as needed. Delirium Precautions  Post arrest resp failure with probable aspiration: improving, some lingering hypoxemia; doing well with IS. SpO2: (P) 96 %; O2 Flow Rate (L/min): 2 L/min, FiO2 (%): 40 %. CXR yesterday done and showed "The heart size and mediastinal contours are within normal limits. Left-sided defibrillator is unchanged. Mild central pulmonary vascular congestion may be present. No consolidative process is noted. The visualized skeletal structures are unremarkable."  History of HTN, HLD: Further care per Cardiology  Type 1 DM: Off of insulin drip. Semglee 35 units twice daily changed to just daily.  Resistant NovoLog sliding scale insulin every 4 to  sensitive NovoLog just AC and at bedtime and now being placed back on Insulin Pump. DM Education Coordinator consulted and making adjustments  Multiple Sclerosis: C/w Dalfampridine TB12 10 mg po BID  Hypothyroidism: TSH was 2.213, continue Levothyroxine 125 mcg p.o. daily  RLS Type Symptoms- related to MS, some breakthrough, now on ropinirole 0.25 mg p.o. nightly  Leukocytosis: Improving. Had worsened in the setting of Steroid Demargination (received Solumedrol w/ Pacer placement). WBC went from 27.1 -> 23.9 -> 18.4 -> 12.7 -> 17.6 -> 22.1 -> 12.6. CTM and Trend and Repeat CBC in the AM   AKI on CKD Stage 3a: BUN/Cr Improved and is now 19/1.03. Avoid Nephrotoxic Medications, Contrast Dyes, Hypotension and Dehydration to Ensure Adequate Renal Perfusion and will need to Renally Adjust Meds -Continue to Monitor and Trend Renal Function carefully and repeat CMP in the AM   Abnormal LFTs and Acute Liver Injury in the setting Shock and Cardiac Arrest: LFTs resolved and normalized. AST peaked at 290 and ALT peaked at 242. Repeat LFTs intermittently.  Hypoalbuminemia: Patient's Albumin now 3.0. CTM and Trend and repeat CMP in the AM  Overweight: Complicates overall prognosis and care. Estimated body mass index is 29.35 kg/m as calculated from the following:   Height as of this encounter: 6\' 1"  (1.854 m).   Weight as of this encounter: 100.9 kg. Weight Loss and Dietary Counseling given   DVT prophylaxis: SCDs Start: 08/30/23 1940 heparin injection 5,000 Units Start: 08/26/23 0815 SCDs Start: 08/26/23 0205    Code Status: Full Code Family Communication: Family at bedside  Disposition Plan:  Level of care: Progressive Status is: Inpatient Remains inpatient appropriate because: Medically stable to D/C to CIR and awaiting Insurance Auth   Consultants:  PCCM transfer Cardiology EP cardiology Advanced heart failure team  Procedures:  As delineated as above  Antimicrobials:   Anti-infectives (From admission, onward)    Start     Dose/Rate Route Frequency Ordered Stop   08/30/23 2239  ceFAZolin (ANCEF) IVPB 1 g/50 mL premix        1 g 100 mL/hr over 30 Minutes Intravenous Every 6 hours 08/30/23 1939 08/31/23 1022   08/30/23 1623  ceFAZolin (ANCEF) 2-4 GM/100ML-% IVPB       Note to Pharmacy: Beverley Fiedler B: cabinet override      08/30/23 1623 08/30/23 1650   08/30/23 1300  gentamicin (GARAMYCIN) 80 mg in sodium chloride 0.9 % 500 mL irrigation        80 mg Irrigation On call 08/30/23 0950 08/30/23 1852   08/30/23 1300  ceFAZolin (ANCEF) IVPB 2g/100 mL premix        2 g 200 mL/hr over 30 Minutes Intravenous On call 08/30/23 0949 08/30/23 1430   08/26/23 1200  cefTRIAXone (ROCEPHIN) 2 g in sodium chloride 0.9 % 100 mL IVPB        2 g 200 mL/hr over 30 Minutes Intravenous Every 24 hours 08/26/23 0738 08/31/23 1024   08/26/23 1000  piperacillin-tazobactam (ZOSYN) IVPB 3.375 g  Status:  Discontinued        3.375 g 12.5 mL/hr over 240 Minutes Intravenous Every 8 hours 08/26/23 0252 08/26/23 0738   08/26/23 0300  piperacillin-tazobactam (ZOSYN) IVPB 3.375 g        3.375 g 100 mL/hr over 30  Minutes Intravenous  Once 08/26/23 0252 08/26/23 0447       Subjective: Seen and examined at bedside and doing well. No complaints sitting in the chair and changing back to his insulin pump today. Awaiting to go to CIR.   Objective: Vitals:   09/02/23 1200 09/02/23 1208 09/02/23 1701 09/02/23 1947  BP: 128/82 128/82 108/70 (P) 102/65  Pulse:  82 81 (P) 65  Resp:  16 16 (P) 16  Temp:  98.7 F (37.1 C) 98.1 F (36.7 C) (P) 98.4 F (36.9 C)  TempSrc:  Oral Oral (P) Oral  SpO2:  96% 100% (P) 96%  Weight:      Height:        Intake/Output Summary (Last 24 hours) at 09/02/2023 2008 Last data filed at 09/02/2023 1705 Gross per 24 hour  Intake 2050 ml  Output 3050 ml  Net -1000 ml   Filed Weights   08/31/23 0409 09/01/23 0500 09/02/23 0423  Weight: 99.7 kg 99.3 kg  100.9 kg   Examination: Physical Exam:  Constitutional: WN/WD overweight chronically ill-appearing AAM in NAD appears calm Respiratory: Diminished to auscultation bilaterally, no wheezing, rales, rhonchi or crackles. Normal respiratory effort and patient is not tachypenic. No accessory muscle use. Unlabored breathing Cardiovascular: RRR, no murmurs / rubs / gallops. S1 and S2 auscultated. 1+ LE Edema.  Abdomen: Soft, non-tender, distended 2/2 body habitus. Bowel sounds positive.  GU: Deferred. Musculoskeletal: No clubbing / cyanosis of digits/nails. No joint deformity upper and lower extremities.  Skin: No rashes, lesions, ulcers on a limited skin evaluation. No induration; Warm and dry.  Neurologic: CN 2-12 grossly intact with no focal deficits. Romberg sign and cerebellar reflexes not assessed.  Psychiatric: Normal judgment and insight. Alert and oriented x 3. Normal mood and appropriate affect.   Data Reviewed: I have personally reviewed following labs and imaging studies  CBC: Recent Labs  Lab 08/29/23 0544 08/30/23 0549 08/31/23 0503 09/01/23 0408 09/02/23 0520  WBC 18.4* 12.7* 17.6* 22.1* 12.6*  NEUTROABS  --   --  15.9* 17.8* 8.7*  HGB 13.1 13.5 13.7 13.4 14.7  HCT 39.0 39.7 40.0 40.1 44.7  MCV 85.7 84.8 83.9 85.9 87.6  PLT 180 173 219 256 249   Basic Metabolic Panel: Recent Labs  Lab 08/29/23 0544 08/30/23 0549 08/31/23 0503 09/01/23 0408 09/02/23 0520  NA 134* 136 136 139 137  K 4.3 4.1 4.4 3.9 4.1  CL 97* 98 101 102 99  CO2 26 28 27 28 24   GLUCOSE 143* 149* 236* 52* 114*  BUN 18 16 26* 23* 19  CREATININE 0.93 1.00 1.32* 1.21 1.03  CALCIUM 8.2* 8.4* 8.8* 8.6* 8.7*  MG 1.9 2.0 2.1 1.9 1.8  PHOS  --   --  3.3 4.5 3.8   GFR: Estimated Creatinine Clearance: 106 mL/min (by C-G formula based on SCr of 1.03 mg/dL). Liver Function Tests: Recent Labs  Lab 08/27/23 0516 08/31/23 0503 09/01/23 0408 09/02/23 0520  AST 95* 45* 32 35  ALT 133* 52* 40 44   ALKPHOS 65 84 76 77  BILITOT 0.5 0.5 0.7 0.6  PROT 5.4* 5.9* 5.4* 6.0*  ALBUMIN 2.9* 2.7* 2.7* 3.0*   No results for input(s): "LIPASE", "AMYLASE" in the last 168 hours. No results for input(s): "AMMONIA" in the last 168 hours. Coagulation Profile: No results for input(s): "INR", "PROTIME" in the last 168 hours. Cardiac Enzymes: No results for input(s): "CKTOTAL", "CKMB", "CKMBINDEX", "TROPONINI" in the last 168 hours. BNP (last 3 results) No results  for input(s): "PROBNP" in the last 8760 hours. HbA1C: No results for input(s): "HGBA1C" in the last 72 hours. CBG: Recent Labs  Lab 09/02/23 0743 09/02/23 1001 09/02/23 1237 09/02/23 1637 09/02/23 1951  GLUCAP 169* 313* 265* 314* 372*   Lipid Profile: No results for input(s): "CHOL", "HDL", "LDLCALC", "TRIG", "CHOLHDL", "LDLDIRECT" in the last 72 hours. Thyroid Function Tests: No results for input(s): "TSH", "T4TOTAL", "FREET4", "T3FREE", "THYROIDAB" in the last 72 hours. Anemia Panel: No results for input(s): "VITAMINB12", "FOLATE", "FERRITIN", "TIBC", "IRON", "RETICCTPCT" in the last 72 hours. Sepsis Labs: Recent Labs  Lab 08/26/23 2146  LATICACIDVEN 3.6*   Recent Results (from the past 240 hours)  Resp panel by RT-PCR (RSV, Flu A&B, Covid) Anterior Nasal Swab     Status: None   Collection Time: 08/25/23  1:12 PM   Specimen: Anterior Nasal Swab  Result Value Ref Range Status   SARS Coronavirus 2 by RT PCR NEGATIVE NEGATIVE Final   Influenza A by PCR NEGATIVE NEGATIVE Final   Influenza B by PCR NEGATIVE NEGATIVE Final    Comment: (NOTE) The Xpert Xpress SARS-CoV-2/FLU/RSV plus assay is intended as an aid in the diagnosis of influenza from Nasopharyngeal swab specimens and should not be used as a sole basis for treatment. Nasal washings and aspirates are unacceptable for Xpert Xpress SARS-CoV-2/FLU/RSV testing.  Fact Sheet for Patients: BloggerCourse.com  Fact Sheet for Healthcare  Providers: SeriousBroker.it  This test is not yet approved or cleared by the [place] FDA and has been authorized for detection and/or diagnosis of SARS-CoV-2 by FDA under an Emergency Use Authorization (EUA). This EUA will remain in effect (meaning this test can be used) for the duration of the COVID-19 declaration under Section 564(b)(1) of the Act, 21 U.S.C. section 360bbb-3(b)(1), unless the authorization is terminated or revoked.     Resp Syncytial Virus by PCR NEGATIVE NEGATIVE Final    Comment: (NOTE) Fact Sheet for Patients: BloggerCourse.com  Fact Sheet for Healthcare Providers: SeriousBroker.it  This test is not yet approved or cleared by the [place] FDA and has been authorized for detection and/or diagnosis of SARS-CoV-2 by FDA under an Emergency Use Authorization (EUA). This EUA will remain in effect (meaning this test can be used) for the duration of the COVID-19 declaration under Section 564(b)(1) of the Act, 21 U.S.C. section 360bbb-3(b)(1), unless the authorization is terminated or revoked.  Performed at [facility], [address], [place], [place] [address]   MRSA Next Gen by PCR, Nasal     Status: None   Collection Time: 08/26/23  3:18 AM   Specimen: Nasal Mucosa; Nasal Swab  Result Value Ref Range Status   MRSA by PCR Next Gen NOT DETECTED NOT DETECTED Final    Comment: (NOTE) The GeneXpert MRSA Assay (FDA approved for NASAL specimens only), is one component of a comprehensive MRSA colonization surveillance program. It is not intended to diagnose MRSA infection nor to guide or monitor treatment for MRSA infections. Test performance is not FDA approved in patients less than 73 years old. Performed at [facility], [address] [place], [place] [address]   Culture, Respiratory w Gram Stain     Status: None   Collection Time: 08/26/23  3:20 AM    Specimen: Tracheal Aspirate; Respiratory  Result Value Ref Range Status   Specimen Description TRACHEAL ASPIRATE  Final   Special Requests NONE  Final   Gram Stain   Final    ABUNDANT WBC PRESENT, PREDOMINANTLY PMN  FEW GRAM POSITIVE COCCI IN PAIRS IN CHAINS    Culture   Final    MODERATE Normal respiratory flora-no Staph aureus or Pseudomonas seen Performed at [facility], [address], [place], [place] [address]    Report Status 08/28/2023 FINAL  Final  Culture, blood (Routine X 2) w Reflex to ID Panel     Status: None   Collection Time: 08/26/23  5:32 AM   Specimen: BLOOD RIGHT HAND  Result Value Ref Range Status   Specimen Description BLOOD RIGHT HAND  Final   Special Requests   Final    BOTTLES DRAWN AEROBIC AND ANAEROBIC Blood Culture results may not be optimal due to an inadequate volume of blood received in culture bottles   Culture   Final    NO GROWTH 5 DAYS Performed at [facility], [address], [place], [place] [address]    Report Status 08/31/2023 FINAL  Final  Culture, blood (Routine X 2) w Reflex to ID Panel     Status: None   Collection Time: 08/26/23  5:35 AM   Specimen: BLOOD RIGHT ARM  Result Value Ref Range Status   Specimen Description BLOOD RIGHT ARM  Final   Special Requests   Final    BOTTLES DRAWN AEROBIC ONLY Blood Culture results may not be optimal due to an inadequate volume of blood received in culture bottles   Culture   Final    NO GROWTH 5 DAYS Performed at [facility], [address] [place], [place] [address]    Report Status 08/31/2023 FINAL  Final  Surgical PCR screen     Status: None   Collection Time: 08/30/23  2:49 PM   Specimen: Nasal Mucosa; Nasal Swab  Result Value Ref Range Status   MRSA, PCR NEGATIVE NEGATIVE Final   Staphylococcus aureus NEGATIVE NEGATIVE Final    Comment: (NOTE) The Xpert SA Assay (FDA approved for NASAL specimens in patients 1 years of age and older), is one component of a  comprehensive surveillance program. It is not intended to diagnose infection nor to guide or monitor treatment. Performed at [facility], [address] [place], [place] [address]     Radiology Studies: DG CHEST PORT 1 VIEW Result Date: 09/01/2023 CLINICAL DATA:  Shortness of breath. EXAM: PORTABLE CHEST 1 VIEW COMPARISON:  August 31, 2023. FINDINGS: The heart size and mediastinal contours are within normal limits. Left-sided defibrillator is unchanged. Mild central pulmonary vascular congestion may be present. No consolidative process is noted. The visualized skeletal structures are unremarkable. IMPRESSION: Possible mild central pulmonary vascular congestion. Electronically Signed   By: Lupita Raider M.D.   On: 09/01/2023 08:16   Scheduled Meds:  amiodarone  400 mg Oral Daily   Chlorhexidine Gluconate Cloth  6 each Topical Daily   dalfampridine  10 mg Oral BID   feeding supplement  237 mL Oral TID BM   haloperidol lactate  5 mg Intravenous Once   heparin injection (subcutaneous)  5,000 Units Subcutaneous Q8H   insulin aspart  0-5 Units Subcutaneous QHS   insulin aspart  0-9 Units Subcutaneous TID WC   insulin glargine-yfgn  35 Units Subcutaneous Daily   insulin pump   Subcutaneous TID WC, HS, 0200   levothyroxine  125 mcg Oral Q0600   metoprolol succinate  25 mg Oral Daily   polyethylene glycol  17 g Oral BID   rOPINIRole  0.25 mg Oral QHS   sacubitril-valsartan  1  tablet Oral BID   senna-docusate  2 tablet Oral BID   sodium chloride flush  3-10 mL Intravenous Q12H   spironolactone  25 mg Oral Daily   Continuous Infusions:   LOS: 7 days   Marguerita Merles, DO [facility] Available via Epic secure chat 7am-7pm After these hours, please refer to coverage provider listed on [email or web address] 09/02/2023, 8:08 PM

## 2023-09-03 ENCOUNTER — Other Ambulatory Visit: Payer: Self-pay

## 2023-09-03 ENCOUNTER — Encounter (HOSPITAL_COMMUNITY): Payer: Self-pay | Admitting: Physical Medicine & Rehabilitation

## 2023-09-03 ENCOUNTER — Inpatient Hospital Stay (HOSPITAL_COMMUNITY)
Admission: AD | Admit: 2023-09-03 | Discharge: 2023-09-11 | DRG: 945 | Disposition: A | Discharge status: Home / Self Care | Source: Intra-hospital | Attending: Physical Medicine & Rehabilitation | Admitting: Physical Medicine & Rehabilitation

## 2023-09-03 DIAGNOSIS — R7401 Elevation of levels of liver transaminase levels: Secondary | ICD-10-CM | POA: Diagnosis present

## 2023-09-03 DIAGNOSIS — E1065 Type 1 diabetes mellitus with hyperglycemia: Secondary | ICD-10-CM | POA: Diagnosis not present

## 2023-09-03 DIAGNOSIS — I428 Other cardiomyopathies: Secondary | ICD-10-CM | POA: Diagnosis not present

## 2023-09-03 DIAGNOSIS — Z79899 Other long term (current) drug therapy: Secondary | ICD-10-CM

## 2023-09-03 DIAGNOSIS — Z794 Long term (current) use of insulin: Secondary | ICD-10-CM

## 2023-09-03 DIAGNOSIS — Z91041 Radiographic dye allergy status: Secondary | ICD-10-CM

## 2023-09-03 DIAGNOSIS — E039 Hypothyroidism, unspecified: Secondary | ICD-10-CM | POA: Diagnosis present

## 2023-09-03 DIAGNOSIS — D72829 Elevated white blood cell count, unspecified: Secondary | ICD-10-CM | POA: Diagnosis not present

## 2023-09-03 DIAGNOSIS — E785 Hyperlipidemia, unspecified: Secondary | ICD-10-CM | POA: Diagnosis present

## 2023-09-03 DIAGNOSIS — I469 Cardiac arrest, cause unspecified: Secondary | ICD-10-CM | POA: Diagnosis not present

## 2023-09-03 DIAGNOSIS — Z888 Allergy status to other drugs, medicaments and biological substances status: Secondary | ICD-10-CM

## 2023-09-03 DIAGNOSIS — G9340 Encephalopathy, unspecified: Secondary | ICD-10-CM | POA: Diagnosis not present

## 2023-09-03 DIAGNOSIS — N179 Acute kidney failure, unspecified: Secondary | ICD-10-CM | POA: Diagnosis not present

## 2023-09-03 DIAGNOSIS — Z79890 Hormone replacement therapy (postmenopausal): Secondary | ICD-10-CM | POA: Diagnosis not present

## 2023-09-03 DIAGNOSIS — R5381 Other malaise: Secondary | ICD-10-CM | POA: Diagnosis not present

## 2023-09-03 DIAGNOSIS — G8220 Paraplegia, unspecified: Secondary | ICD-10-CM | POA: Diagnosis present

## 2023-09-03 DIAGNOSIS — I130 Hypertensive heart and chronic kidney disease with heart failure and stage 1 through stage 4 chronic kidney disease, or unspecified chronic kidney disease: Secondary | ICD-10-CM | POA: Diagnosis not present

## 2023-09-03 DIAGNOSIS — N183 Chronic kidney disease, stage 3 unspecified: Secondary | ICD-10-CM | POA: Diagnosis present

## 2023-09-03 DIAGNOSIS — Z833 Family history of diabetes mellitus: Secondary | ICD-10-CM | POA: Diagnosis not present

## 2023-09-03 DIAGNOSIS — R41840 Attention and concentration deficit: Secondary | ICD-10-CM

## 2023-09-03 DIAGNOSIS — I5021 Acute systolic (congestive) heart failure: Secondary | ICD-10-CM | POA: Diagnosis present

## 2023-09-03 DIAGNOSIS — I2510 Atherosclerotic heart disease of native coronary artery without angina pectoris: Secondary | ICD-10-CM | POA: Diagnosis present

## 2023-09-03 DIAGNOSIS — E1022 Type 1 diabetes mellitus with diabetic chronic kidney disease: Secondary | ICD-10-CM | POA: Diagnosis not present

## 2023-09-03 DIAGNOSIS — Z91199 Patient's noncompliance with other medical treatment and regimen due to unspecified reason: Secondary | ICD-10-CM

## 2023-09-03 DIAGNOSIS — G2581 Restless legs syndrome: Secondary | ICD-10-CM | POA: Diagnosis present

## 2023-09-03 DIAGNOSIS — G931 Anoxic brain damage, not elsewhere classified: Secondary | ICD-10-CM | POA: Diagnosis not present

## 2023-09-03 DIAGNOSIS — E103599 Type 1 diabetes mellitus with proliferative diabetic retinopathy without macular edema, unspecified eye: Secondary | ICD-10-CM | POA: Diagnosis present

## 2023-09-03 DIAGNOSIS — G35 Multiple sclerosis: Secondary | ICD-10-CM | POA: Diagnosis present

## 2023-09-03 DIAGNOSIS — Z95810 Presence of automatic (implantable) cardiac defibrillator: Secondary | ICD-10-CM

## 2023-09-03 DIAGNOSIS — Z8674 Personal history of sudden cardiac arrest: Secondary | ICD-10-CM

## 2023-09-03 DIAGNOSIS — R579 Shock, unspecified: Secondary | ICD-10-CM | POA: Diagnosis not present

## 2023-09-03 DIAGNOSIS — N189 Chronic kidney disease, unspecified: Secondary | ICD-10-CM

## 2023-09-03 DIAGNOSIS — Z9641 Presence of insulin pump (external) (internal): Secondary | ICD-10-CM | POA: Diagnosis present

## 2023-09-03 DIAGNOSIS — Z0000 Encounter for general adult medical examination without abnormal findings: Secondary | ICD-10-CM | POA: Diagnosis not present

## 2023-09-03 DIAGNOSIS — I872 Venous insufficiency (chronic) (peripheral): Secondary | ICD-10-CM | POA: Diagnosis present

## 2023-09-03 DIAGNOSIS — E875 Hyperkalemia: Secondary | ICD-10-CM | POA: Diagnosis present

## 2023-09-03 DIAGNOSIS — E1069 Type 1 diabetes mellitus with other specified complication: Secondary | ICD-10-CM | POA: Diagnosis not present

## 2023-09-03 DIAGNOSIS — E782 Mixed hyperlipidemia: Secondary | ICD-10-CM

## 2023-09-03 DIAGNOSIS — I4901 Ventricular fibrillation: Secondary | ICD-10-CM | POA: Diagnosis not present

## 2023-09-03 LAB — GLUCOSE, CAPILLARY
Glucose-Capillary: 149 mg/dL — ABNORMAL HIGH (ref 70–99)
Glucose-Capillary: 152 mg/dL — ABNORMAL HIGH (ref 70–99)
Glucose-Capillary: 220 mg/dL — ABNORMAL HIGH (ref 70–99)
Glucose-Capillary: 254 mg/dL — ABNORMAL HIGH (ref 70–99)
Glucose-Capillary: 281 mg/dL — ABNORMAL HIGH (ref 70–99)
Glucose-Capillary: 334 mg/dL — ABNORMAL HIGH (ref 70–99)
Glucose-Capillary: 414 mg/dL — ABNORMAL HIGH (ref 70–99)

## 2023-09-03 LAB — CREATININE, SERUM
Creatinine, Ser: 1.24 mg/dL (ref 0.61–1.24)
GFR, Estimated: >60 mL/min (ref >60)

## 2023-09-03 LAB — CBC
HCT: 44.8 % (ref 39.0–52.0)
Hemoglobin: 15 g/dL (ref 13.0–17.0)
MCH: 28.8 pg (ref 26.0–34.0)
MCHC: 33.5 g/dL (ref 30.0–36.0)
MCV: 86 fL (ref 80.0–100.0)
Platelets: 283 10*3/uL (ref 150–400)
RBC: 5.21 MIL/uL (ref 4.22–5.81)
RDW: 13.5 % (ref 11.5–15.5)
WBC: 15.5 10*3/uL — ABNORMAL HIGH (ref 4.0–10.5)
nRBC: 0 % (ref 0.0–0.2)

## 2023-09-03 MED ORDER — ROPINIROLE HCL 0.25 MG PO TABS
0.2500 mg | ORAL_TABLET | Freq: Every day | ORAL | Status: DC
  Administered 2023-09-03 – 2023-09-10 (×8): 0.25 mg via ORAL
  Filled 2023-09-03 (×8): qty 1

## 2023-09-03 MED ORDER — INSULIN ASPART 100 UNIT/ML IJ SOLN: 0.0000 [IU] | Freq: Every day | INTRAMUSCULAR | Status: DC

## 2023-09-03 MED ORDER — INSULIN PUMP: Freq: Three times a day (TID) | SUBCUTANEOUS | Status: DC

## 2023-09-03 MED ORDER — POLYETHYLENE GLYCOL 3350 17 G PO PACK
17.0000 g | PACK | Freq: Two times a day (BID) | ORAL | Status: DC
  Administered 2023-09-03 – 2023-09-10 (×14): 17 g via ORAL
  Filled 2023-09-03 (×16): qty 1

## 2023-09-03 MED ORDER — HEPARIN SODIUM (PORCINE) 5000 UNIT/ML IJ SOLN: 5000.0000 [IU] | Freq: Three times a day (TID) | INTRAMUSCULAR | Status: DC

## 2023-09-03 MED ORDER — MAGIC MOUTHWASH W/LIDOCAINE
5.0000 mL | Freq: Four times a day (QID) | ORAL | Status: DC | PRN
Start: 2023-09-03 — End: 2023-09-11

## 2023-09-03 MED ORDER — SACUBITRIL-VALSARTAN 49-51 MG PO TABS: 1.0000 | ORAL_TABLET | Freq: Two times a day (BID) | ORAL | Status: DC

## 2023-09-03 MED ORDER — AMIODARONE HCL 200 MG PO TABS
400.0000 mg | ORAL_TABLET | Freq: Every day | ORAL | Status: DC
  Administered 2023-09-04 – 2023-09-11 (×8): 400 mg via ORAL
  Filled 2023-09-03 (×8): qty 2

## 2023-09-03 MED ORDER — INSULIN ASPART 100 UNIT/ML IJ SOLN: 0.0000 [IU] | Freq: Three times a day (TID) | INTRAMUSCULAR | Status: DC

## 2023-09-03 MED ORDER — DALFAMPRIDINE ER 10 MG PO TB12
10.0000 mg | ORAL_TABLET | Freq: Two times a day (BID) | ORAL | Status: DC
  Administered 2023-09-03 – 2023-09-11 (×16): 10 mg via ORAL
  Filled 2023-09-03 (×19): qty 1

## 2023-09-03 MED ORDER — HEPARIN SODIUM (PORCINE) 5000 UNIT/ML IJ SOLN
5000.0000 [IU] | Freq: Three times a day (TID) | INTRAMUSCULAR | Status: DC
  Administered 2023-09-03 – 2023-09-10 (×20): 5000 [IU] via SUBCUTANEOUS
  Filled 2023-09-03 (×19): qty 1

## 2023-09-03 MED ORDER — METOPROLOL SUCCINATE ER 25 MG PO TB24
25.0000 mg | ORAL_TABLET | Freq: Every day | ORAL | Status: DC
  Administered 2023-09-04 – 2023-09-07 (×4): 25 mg via ORAL
  Filled 2023-09-03 (×4): qty 1

## 2023-09-03 MED ORDER — INSULIN GLARGINE-YFGN 100 UNIT/ML ~~LOC~~ SOLN
5.0000 [IU] | Freq: Every day | SUBCUTANEOUS | Status: DC
  Administered 2023-09-03: 5 [IU] via SUBCUTANEOUS
  Filled 2023-09-03 (×2): qty 0.05

## 2023-09-03 MED ORDER — ENSURE ENLIVE PO LIQD
237.0000 mL | Freq: Three times a day (TID) | ORAL | Status: DC
  Administered 2023-09-03 – 2023-09-10 (×19): 237 mL via ORAL

## 2023-09-03 MED ORDER — INSULIN GLARGINE-YFGN 100 UNIT/ML ~~LOC~~ SOLN: 35.0000 [IU] | Freq: Every day | SUBCUTANEOUS | Status: DC

## 2023-09-03 MED ORDER — SENNOSIDES-DOCUSATE SODIUM 8.6-50 MG PO TABS
2.0000 | ORAL_TABLET | Freq: Two times a day (BID) | ORAL | Status: DC
  Administered 2023-09-03 – 2023-09-11 (×15): 2 via ORAL
  Filled 2023-09-03 (×17): qty 2

## 2023-09-03 MED ORDER — SACUBITRIL-VALSARTAN 49-51 MG PO TABS
1.0000 | ORAL_TABLET | Freq: Two times a day (BID) | ORAL | Status: DC
  Administered 2023-09-03 – 2023-09-08 (×11): 1 via ORAL
  Filled 2023-09-03 (×13): qty 1

## 2023-09-03 MED ORDER — POLYETHYLENE GLYCOL 3350 17 G PO PACK: 17.0000 g | PACK | Freq: Two times a day (BID) | ORAL | 0 refills | Status: DC

## 2023-09-03 MED ORDER — INSULIN ASPART 100 UNIT/ML IJ SOLN
4.0000 [IU] | Freq: Three times a day (TID) | INTRAMUSCULAR | Status: DC
  Administered 2023-09-03: 4 [IU] via SUBCUTANEOUS

## 2023-09-03 MED ORDER — LEVOTHYROXINE SODIUM 75 MCG PO TABS
125.0000 ug | ORAL_TABLET | Freq: Every day | ORAL | Status: DC
  Administered 2023-09-04 – 2023-09-11 (×8): 125 ug via ORAL
  Filled 2023-09-03 (×8): qty 1

## 2023-09-03 MED ORDER — INSULIN ASPART 100 UNIT/ML IJ SOLN
0.0000 [IU] | Freq: Every day | INTRAMUSCULAR | Status: DC
  Administered 2023-09-03: 5 [IU] via SUBCUTANEOUS
  Administered 2023-09-04: 1 [IU] via SUBCUTANEOUS
  Administered 2023-09-04: 7 [IU] via SUBCUTANEOUS

## 2023-09-03 MED ORDER — INSULIN ASPART 100 UNIT/ML IJ SOLN: 4.0000 [IU] | Freq: Three times a day (TID) | INTRAMUSCULAR | Status: DC

## 2023-09-03 MED ORDER — MAGIC MOUTHWASH W/LIDOCAINE: 5.0000 mL | Freq: Four times a day (QID) | ORAL | Status: DC | PRN

## 2023-09-03 MED ORDER — SENNOSIDES-DOCUSATE SODIUM 8.6-50 MG PO TABS: 2.0000 | ORAL_TABLET | Freq: Two times a day (BID) | ORAL | Status: DC

## 2023-09-03 MED ORDER — SPIRONOLACTONE 25 MG PO TABS
25.0000 mg | ORAL_TABLET | Freq: Every day | ORAL | Status: DC
  Administered 2023-09-04 – 2023-09-08 (×5): 25 mg via ORAL
  Filled 2023-09-03 (×5): qty 1

## 2023-09-03 MED ORDER — INSULIN GLARGINE-YFGN 100 UNIT/ML ~~LOC~~ SOLN
35.0000 [IU] | Freq: Every day | SUBCUTANEOUS | Status: DC
  Administered 2023-09-04 – 2023-09-05 (×2): 35 [IU] via SUBCUTANEOUS
  Filled 2023-09-03 (×3): qty 0.35

## 2023-09-03 MED ORDER — INSULIN ASPART 100 UNIT/ML IJ SOLN
0.0000 [IU] | Freq: Every day | INTRAMUSCULAR | Status: DC
Start: 2023-09-03 — End: 2023-09-16

## 2023-09-03 MED ORDER — ROPINIROLE HCL 0.25 MG PO TABS: 0.2500 mg | ORAL_TABLET | Freq: Every day | ORAL | Status: DC

## 2023-09-03 MED ORDER — ORAL CARE MOUTH RINSE: 15.0000 mL | OROMUCOSAL | Status: DC | PRN

## 2023-09-03 MED ORDER — ACETAMINOPHEN 325 MG PO TABS: 325.0000 mg | ORAL_TABLET | ORAL | Status: DC | PRN

## 2023-09-03 MED ORDER — INSULIN ASPART 100 UNIT/ML IJ SOLN
0.0000 [IU] | Freq: Three times a day (TID) | INTRAMUSCULAR | Status: DC
  Administered 2023-09-03: 9 [IU] via SUBCUTANEOUS

## 2023-09-03 MED ORDER — SPIRONOLACTONE 25 MG PO TABS: 25.0000 mg | ORAL_TABLET | Freq: Every day | ORAL | Status: DC

## 2023-09-03 MED ORDER — AMIODARONE HCL 400 MG PO TABS: 400.0000 mg | ORAL_TABLET | Freq: Every day | ORAL | Status: DC

## 2023-09-03 MED ORDER — METOPROLOL SUCCINATE ER 25 MG PO TB24: 25.0000 mg | ORAL_TABLET | Freq: Every day | ORAL | Status: DC

## 2023-09-03 MED ORDER — ENSURE ENLIVE PO LIQD: 237.0000 mL | Freq: Three times a day (TID) | ORAL | 12 refills | Status: DC

## 2023-09-03 NOTE — Discharge Instructions (Addendum)
 Inpatient Rehab Discharge Instructions  Johnathan Ross Discharge date and time: No discharge date for patient encounter.   Activities/Precautions/ Functional Status: Activity: As tolerated Diet: Diabetic diet Wound Care: Routine skin checks Functional status:  ___ No restrictions     ___ Walk up steps independently ___ 24/7 supervision/assistance   ___ Walk up steps with assistance ___ Intermittent supervision/assistance  ___ Bathe/dress independently ___ Walk with walker     _x__ Bathe/dress with assistance ___ Walk Independently    ___ Shower independently ___ Walk with assistance    ___ Shower with assistance ___ No alcohol     ___ Return to work/school ________  Special Instructions: No driving smoking or alcohol  COMMUNITY REFERRALS UPON DISCHARGE:    Outpatient: PT              Agency: [facility]    Phone:  [phone]              Appointment Date/Time: *Please expect follow-up within 7-10 business days to schedule your appointment. If you have not received follow-up, be sure to contact the site directly.*     My questions have been answered and I understand these instructions. I will adhere to these goals and the provided educational materials after my discharge from the hospital.  Patient/Caregiver Signature _______________________________ Date __________  Clinician Signature _______________________________________ Date __________  Please bring this form and your medication list with you to all your follow-up doctor's appointments.

## 2023-09-03 NOTE — Discharge Summary (Signed)
 Physician Discharge Summary   Patient: Johnathan Ross MRN: [id number] DOB: 09-25-1970  Admit date:     08/26/2023  Discharge date: 09/03/23  Discharge Physician: Marguerita Merles, DO   PCP: Etta Grandchild, MD   Recommendations at discharge:   Follow up with PCP w/in 1-2 weeks and repeat CBC,CMP, Mag, Phos w/in 1 week Follow up with Medical Cardiology, EP and Advanced Heart Failure Team at D/C and have Genetic Testing in the outpatient setting  Discharge Diagnoses: Principal Problem:   Cardiac arrest [facility])  Resolved Problems:   * No resolved hospital problems. [facility] Course: The patient is a 53 year old overweight African-American male with a past medical history significant for relapsing remitting MS on [place], hyperlipidemia, hypertension, hypothyroidism, diabetes mellitus type 1 as well as other comorbidities who presented to the ED after collapsing requiring bystander CPR and is found to be in V-fib V. tach arrest.  CPR lasted about 20 minutes and he was received shocked 8 times and epi x 2 and amiodarone.  He is brought to the ED no is normal sinus rhythm and required peripheral neuropathy.  No PE was done and is admitted to the critical care service.  Cardiology evaluated and patient was found to have a mixed cardiogenic and septic shock on admission which is now improved.  Further workup also done and showed acute biventricular systolic CHF with right ventricular dominant shock.  EP and advanced heart failure team consulted.  He was transferred to [facility] service on 08/30/2023.  He underwent further workup and is s/p ICD placement and now being transitioned from IV Amiodarone to oral Amiodarone on 08/31/23. He is medically stable for D/C to CIR and will be transferred there today.   Assessment and Plan:  Witnessed out of hospital Vfib/VTach arrest x 20 min intermittent-ROSC after multiple defibrillations and bystander CPR" had received epi and amnio trops flat, EF down to 30-35%, query  primary arrythmia and new cardiomyopathy; On Amio as on Milrinone gtt per AHF team along with Diuresis and GDMT. Cardiac MRI done and getting showed no evidence of LGE as below and also did show mild to moderate decrease in left ventricular systolic function of 40% and the mild decrease in right ventricular systolic function with RV of 45%. He is s/p ICD placement and being transitioned from IV Amiodarone to oral 400 mg po Daily  PT OT recommending acute inpatient rehabilitation and is medically stable and will D/C today   Mix cardiogenic and septic (aspiration), improved: Septic shock is resolved and he is blood pressure stable off pressors.  WBC continue to trend and he has received his short course of antibiotics and completed with IV ceftriaxone.  Blood cultures showing no growth to date. PCT was 21.78 on last check last week. WBC trending down on last check.   Acute biventricular Systolic Heart Failure with Right Ventricular dominant shock Further care per Cardiology.  BNP was 102.9 on admission he is on now on Entresto 49/51 twice daily, spironolactone 12 5 mg p.o. daily, off of milrinone is Co-Ox stable.  Central line has been removed.  No evidence of late gadolinium enhancement in the left ventricular myocardium and had normal parametric mapping and tissue characterization on the cardiac MRI and the cardiologist feels that it is hopefully dysfunction postarrest stunning.  Received ICD 08/30/23. Deferring initiation of SGLT2 inhibitor to Cardiology. Stable from a Heart failure perspective and they have signed off and will obtain Genetic testing in the outpt setting  Post arrest  Encephalopathy: Had Some lingering delirium but overall improved and appears baseline; has Haloperidol 5 mg as needed. Delirium Precautions  Post arrest resp failure with probable aspiration: improving, some lingering hypoxemia; doing well with IS. SpO2: 100 %; O2 Flow Rate (L/min): 2 L/min, FiO2 (%): 40 %. CXR the day before  yesterday done and showed "The heart size and mediastinal contours are within normal limits. Left-sided defibrillator is unchanged. Mild central pulmonary vascular congestion may be present. No consolidative process is noted. The visualized skeletal structures are unremarkable."  History of HTN, HLD: Further care per Cardiology  Type 1 DM: Off of insulin drip. Semglee 35 units twice daily changed to just daily.  Resistant NovoLog sliding scale insulin every 4 to sensitive NovoLog just AC and at bedtime and was being placed back on Insulin Pump but didn't have the equipment and CIR will manage with Sq insulin Regimen now. DM Education Coordinator consulted and making adjustments and added Novolog 4 units TID   Multiple Sclerosis: C/w Dalfampridine TB12 10 mg po BID  Hypothyroidism: TSH was 2.213, continue Levothyroxine 125 mcg p.o. daily  RLS Type Symptoms- related to MS, some breakthrough, now on ropinirole 0.25 mg p.o. nightly  Leukocytosis: Improving. Had worsened in the setting of Steroid Demargination (received Solumedrol w/ Pacer placement). WBC went from 27.1 -> 23.9 -> 18.4 -> 12.7 -> 17.6 -> 22.1 -> 12.6. CTM and Trend and Repeat CBC in the AM   AKI on CKD Stage 3a: BUN/Cr Improved and is now 19/1.03 on the last check. Avoid Nephrotoxic Medications, Contrast Dyes, Hypotension and Dehydration to Ensure Adequate Renal Perfusion and will need to Renally Adjust Meds -Continue to Monitor and Trend Renal Function carefully and repeat CMP in the AM   Abnormal LFTs and Acute Liver Injury in the setting Shock and Cardiac Arrest: LFTs resolved and normalized. AST peaked at 290 and ALT peaked at 242. Repeat LFTs intermittently.  Hypoalbuminemia: Patient's Albumin now 3.0. CTM and Trend and repeat CMP in the AM  Overweight: Complicates overall prognosis and care. Estimated body mass index is 28.82 kg/m as calculated from the following:   Height as of this encounter: 6\' 1"  (1.854 m).   Weight as  of this encounter: 99.1 kg. Weight Loss and Dietary Counseling given  Nutrition Documentation    Flowsheet Row ED to Hosp-Admission (Current) from 08/26/2023 in [facility]  Nutrition Problem Inadequate oral intake  Etiology inability to eat  Nutrition Goal Patient will meet greater than or equal to 90% of their needs  Interventions Refer to RD note for recommendations, Ensure Enlive (each supplement provides 350kcal and 20 grams of protein)      Consultants: PCCM transfer Cardiology EP cardiology Advanced heart failure team  Procedures performed: As delineated as abover   Disposition:  CIR  Diet recommendation:  Cardiac and Carb modified diet DISCHARGE MEDICATION: Allergies as of 09/03/2023       Reactions   Zestril [lisinopril] Cough   Ivp Dye [iodinated Contrast Media] Hives, Itching        Medication List     PAUSE taking these medications    NovoLOG 100 UNIT/ML injection Wait to take this until your doctor or other care provider tells you to start again. Generic drug: insulin aspart USE UP TO 80 UNITS IN INSULIN PUMP DAILY-DX CODE E10.8 You also have another medication with the same name that you may need to continue taking.       STOP taking these medications  baclofen 10 MG tablet Commonly known as: LIORESAL   losartan 50 MG tablet Commonly known as: COZAAR       TAKE these medications    amiodarone 400 MG tablet Commonly known as: PACERONE Take 1 tablet (400 mg total) by mouth daily. Start taking on: September 04, 2023   dalfampridine 10 MG Tb12 Take 1 tablet (10 mg total) by mouth 2 (two) times daily.   feeding supplement Liqd Take 237 mLs by mouth 3 (three) times daily between meals.   insulin glargine-yfgn 100 UNIT/ML injection Commonly known as: SEMGLEE Inject 0.35 mLs (35 Units total) into the skin daily. Start taking on: September 04, 2023   levocetirizine 5 MG tablet Commonly known as: XYZAL TAKE 1 TABLET BY MOUTH  EVERY DAY IN THE EVENING   magic mouthwash w/lidocaine Soln Take 5 mLs by mouth 4 (four) times daily as needed for mouth pain. Suspension contains equal amounts of Maalox Extra Strength, nystatin, diphenhydramine and lidocaine.   metoprolol succinate 25 MG 24 hr tablet Commonly known as: TOPROL-XL Take 1 tablet (25 mg total) by mouth daily. Start taking on: September 04, 2023   insulin aspart 100 UNIT/ML injection Commonly known as: novoLOG Inject 0-5 Units into the skin at bedtime. What changed: Another medication with the same name was paused. Ask your nurse or doctor if you should take this medication.   insulin aspart 100 UNIT/ML injection Commonly known as: novoLOG Inject 0-9 Units into the skin 3 (three) times daily with meals. What changed: Another medication with the same name was paused. Ask your nurse or doctor if you should take this medication.   insulin aspart 100 UNIT/ML injection Commonly known as: novoLOG Inject 4 Units into the skin 3 (three) times daily with meals. What changed: Another medication with the same name was paused. Ask your nurse or doctor if you should take this medication.   polyethylene glycol 17 g packet Commonly known as: MIRALAX / GLYCOLAX Take 17 g by mouth 2 (two) times daily.   rOPINIRole 0.25 MG tablet Commonly known as: REQUIP Take 1 tablet (0.25 mg total) by mouth at bedtime.   sacubitril-valsartan 49-51 MG Commonly known as: ENTRESTO Take 1 tablet by mouth 2 (two) times daily.   senna-docusate 8.6-50 MG tablet Commonly known as: Senokot-S Take 2 tablets by mouth 2 (two) times daily.   simvastatin 20 MG tablet Commonly known as: ZOCOR Take 1 tablet (20 mg total) by mouth daily at 6 PM.   spironolactone 25 MG tablet Commonly known as: ALDACTONE Take 1 tablet (25 mg total) by mouth daily. Start taking on: September 04, 2023   Synthroid 125 MCG tablet Generic drug: levothyroxine Take 1 tablet by mouth 6 days per week.        Discharge Exam: Filed Weights   09/01/23 0500 09/02/23 0423 09/03/23 0409  Weight: 99.3 kg 100.9 kg 99.1 kg   Vitals:   09/03/23 0741 09/03/23 1159  BP: 102/70 96/69  Pulse: 71 70  Resp: 16 16  Temp: 98 F (36.7 C) 98.2 F (36.8 C)  SpO2: 97% 100%   Examination: Physical Exam:  Constitutional: WN/WD overweight chronically ill-appearing AAM in NAD Respiratory: Diminished to auscultation bilaterally, no wheezing, rales, rhonchi or crackles. Normal respiratory effort and patient is not tachypenic. No accessory muscle use. Unlabored breathing  Cardiovascular: RRR, no murmurs / rubs / gallops. S1 and S2 auscultated. 1+ LE Edema  Abdomen: Soft, non-tender, distended 2/2 body habitus. Bowel sounds positive.  GU: Deferred. Musculoskeletal: No  clubbing / cyanosis of digits/nails. No joint deformity upper and lower extremities.  Skin: No rashes, lesions, ulcers on a limited skin evaluation. No induration; Warm and dry.  Neurologic: CN 2-12 grossly intact with no focal deficits.  Romberg sign and cerebellar reflexes not assessed.  Psychiatric: Normal judgment and insight. Alert and oriented x 3. Normal mood and appropriate affect.   Condition at discharge: stable  The results of significant diagnostics from this hospitalization (including imaging, microbiology, ancillary and laboratory) are listed below for reference.   Imaging Studies: DG CHEST PORT 1 VIEW Result Date: 09/01/2023 CLINICAL DATA:  Shortness of breath. EXAM: PORTABLE CHEST 1 VIEW COMPARISON:  August 31, 2023. FINDINGS: The heart size and mediastinal contours are within normal limits. Left-sided defibrillator is unchanged. Mild central pulmonary vascular congestion may be present. No consolidative process is noted. The visualized skeletal structures are unremarkable. IMPRESSION: Possible mild central pulmonary vascular congestion. Electronically Signed   By: Lupita Raider M.D.   On: 09/01/2023 08:16   DG Chest 2 View Result  Date: 08/31/2023 CLINICAL DATA:  AICD placement. EXAM: CHEST - 2 VIEW COMPARISON:  08/27/2023 FINDINGS: Heart size is normal. Single lead AICD is seen with tip overlying the right ventricle. No pneumothorax visualized. Decreased interstitial edema seen since previous study. No No evidence of focal consolidation or pleural effusion. IMPRESSION: Single lead AICD in expected position.  No pneumothorax visualized. Decreased pulmonary interstitial edema. Electronically Signed   By: Danae Orleans M.D.   On: 08/31/2023 10:26   EP PPM/ICD IMPLANT Result Date: 08/30/2023  SURGEON:  Loman Brooklyn, MD    PREPROCEDURE DIAGNOSES:  1. Cardiac arrest due to VT/VF    POSTPROCEDURE DIAGNOSES:  1. Cardiac arrest due to VT/VF    PROCEDURES:   1. ICD implantation.  2. Left upper extremity venography    INTRODUCTION: Johnathan Ross is a 53 y.o. male who presented to the hospital with cardiac arrest due to ventricular tachycardia/ventricular fibrillation.  Cardiac MRI shows an ejection fraction of 40% and no scar.  The patient therefore  presents today for ICD implantation.    DESCRIPTION OF PROCEDURE:  Informed written consent was obtained and the patient was brought to the electrophysiology lab in the fasting state. The patient was adequately sedated with intravenous Versed, and fentanyl as outlined in the nursing report.  The patient's left chest was prepped and draped in the usual sterile fashion by the EP lab staff.  The skin overlying the left deltopectoral region was infiltrated with lidocaine for local analgesia.  A 5-cm incision was made over the left deltopectoral region.  A left subcutaneous defibrillator pocket was fashioned using a combination of sharp and blunt dissection.  Electrocautery was used to assure hemostasis. Left Upper extremity Venography:  A venogram of the left upper extremity was performed which revealed a moderate sized left axillary vein which emptied into a moderate sized left subclavian vein.  The  axillary vein was subtotally occluded at the level of the first rib with collateral circulation around this area. RA/RV Lead Placement: The left axillary vein was cannulated with fluoroscopic visualization.  Using multiple sheaths and dilators, a 7 French peel-away sheath was advanced across the obstruction and into the superior vena cava.  Through the left axillary vein, a [employer] [place], model 1610R-60 (serial number [id number]) right ventricular defibrillator lead was advanced with fluoroscopic visualization into the right ventricular apex.  The right ventricular lead R-wave measured 5 mV with impedance of 701 ohms and a  threshold of 1 volts at 0.5 milliseconds. The leads were secured to the pectoralis  fascia using #2 silk suture over the suture sleeves.  The pocket then  irrigated with copious gentamicin solution.  The leads were then  connected to an [employer] Gallant[employer] (serial  Number [id number]) ICD.  The defibrillator was placed into the  pocket.  The pocket was then closed in 3 layers with 2.0 Vicryl suture  for the subcutaneous and 3.0 Vicryl suture subcuticular layers.  EBL<43ml. Steri-Strips and a  sterile dressing were then applied.    CONCLUSIONS:  1. Cardiac arrest due to VT/VF  2. Successful ICD implantation.  3. No early apparent complications.   MR CARDIAC MORPHOLOGY W WO CONTRAST Result Date: 08/29/2023 CLINICAL DATA:  Clinical question of VF arrest Study assumes BSA of 2.29 m2. EXAM: CARDIAC MRI TECHNIQUE: The patient was scanned on a 1.5 Tesla GE magnet. A dedicated cardiac coil was used. Functional imaging was done using Fiesta sequences. 2,3, and 4 chamber views were done to assess for RWMA's. Modified Simpson's rule using a short axis stack was used to calculate an ejection fraction on a dedicated work [employer], [employer]. The patient received 10 cc of Gadavist. After 10 minutes inversion recovery sequences were used to assess for infiltration and scar tissue. Flow  quantification was performed 2 times during this examination with flow quantification performed at the levels of the ascending aorta above the valve, pulmonary artery above the valve. CONTRAST:  10 cc  of Gadavist FINDINGS: 1. Normal left ventricular size, with LVEDD 52 mm, and LVEDVi 67 mL/m2. Normal left ventricular thickness. Mild to moderate decrease in left ventricular systolic function (LVEF =40%). There are no regional wall motion abnormalities, but global hypokinesis. Normal resting perfusion. Left ventricular parametric mapping notable for normal T2 and ECV. There is no late gadolinium enhancement in the left ventricular myocardium. There is no evidence of LV thrombus. 2. Normal right ventricular size with RVEDVI 69 mL/m2. Normal right ventricular thickness. Mild decrease in right ventricular systolic function (RVEF =45%). There is septal and apical hypokinesis with preserved planar excursion. 3.  Normal left and right atrial size. 4. Normal size of the aortic root, ascending aorta and pulmonary artery. 5. Valve assessment: Aortic Valve: Tri-leaflet aortic valve. Qualitatively, there is no significant regurgitation. Regurgitant fraction <1%. Pulmonic Valve: Qualitatively, there is no significant regurgitation. Regurgitant fraction < 1%. Tricuspid Valve: Qualitatively, there is no significant regurgitation. Regurgitant fraction 1%. Mitral Valve: Qualitatively, there is no significant regurgitation. Regurgitant fraction 1%. 6.  Normal pericardium.  No pericardial effusion. 7. Bilateral dependent ground glass opacities and consolidation. Recommended dedicated study if concerned for non-cardiac pathology. 8. Breath hold artifacts noted. Study was performed free breathing. This decreased the sensitivity of volumetric assessments. IMPRESSION: 1. Mild to moderate decrease in left ventricular systolic function (LVEF =40%). 2. There is no late gadolinium enhancement in the left ventricular myocardium. Normal  parametric mapping and tissue characterization. 3. Mild decrease in right ventricular systolic function (RVEF =45%). Riley Lam MD Electronically Signed   By: Riley Lam M.D.   On: 08/29/2023 18:37   MR CARDIAC VELOCITY FLOW MAP Result Date: 08/29/2023 CLINICAL DATA:  Clinical question of VF arrest Study assumes BSA of 2.29 m2. EXAM: CARDIAC MRI TECHNIQUE: The patient was scanned on a 1.5 Tesla GE magnet. A dedicated cardiac coil was used. Functional imaging was done using Fiesta sequences. 2,3, and 4 chamber views were done to assess for RWMA's. Modified Simpson's rule using  a short axis stack was used to calculate an ejection fraction on a dedicated work [employer] officer, [employer]. The patient received 10 cc of Gadavist. After 10 minutes inversion recovery sequences were used to assess for infiltration and scar tissue. Flow quantification was performed 2 times during this examination with flow quantification performed at the levels of the ascending aorta above the valve, pulmonary artery above the valve. CONTRAST:  10 cc  of Gadavist FINDINGS: 1. Normal left ventricular size, with LVEDD 52 mm, and LVEDVi 67 mL/m2. Normal left ventricular thickness. Mild to moderate decrease in left ventricular systolic function (LVEF =40%). There are no regional wall motion abnormalities, but global hypokinesis. Normal resting perfusion. Left ventricular parametric mapping notable for normal T2 and ECV. There is no late gadolinium enhancement in the left ventricular myocardium. There is no evidence of LV thrombus. 2. Normal right ventricular size with RVEDVI 69 mL/m2. Normal right ventricular thickness. Mild decrease in right ventricular systolic function (RVEF =45%). There is septal and apical hypokinesis with preserved planar excursion. 3.  Normal left and right atrial size. 4. Normal size of the aortic root, ascending aorta and pulmonary artery. 5. Valve assessment: Aortic Valve: Tri-leaflet aortic  valve. Qualitatively, there is no significant regurgitation. Regurgitant fraction <1%. Pulmonic Valve: Qualitatively, there is no significant regurgitation. Regurgitant fraction < 1%. Tricuspid Valve: Qualitatively, there is no significant regurgitation. Regurgitant fraction 1%. Mitral Valve: Qualitatively, there is no significant regurgitation. Regurgitant fraction 1%. 6.  Normal pericardium.  No pericardial effusion. 7. Bilateral dependent ground glass opacities and consolidation. Recommended dedicated study if concerned for non-cardiac pathology. 8. Breath hold artifacts noted. Study was performed free breathing. This decreased the sensitivity of volumetric assessments. IMPRESSION: 1. Mild to moderate decrease in left ventricular systolic function (LVEF =40%). 2. There is no late gadolinium enhancement in the left ventricular myocardium. Normal parametric mapping and tissue characterization. 3. Mild decrease in right ventricular systolic function (RVEF =45%). Riley Lam MD Electronically Signed   By: Riley Lam M.D.   On: 08/29/2023 18:37   MR CARDIAC VELOCITY FLOW MAP Result Date: 08/29/2023 CLINICAL DATA:  Clinical question of VF arrest Study assumes BSA of 2.29 m2. EXAM: CARDIAC MRI TECHNIQUE: The patient was scanned on a 1.5 Tesla GE magnet. A dedicated cardiac coil was used. Functional imaging was done using Fiesta sequences. 2,3, and 4 chamber views were done to assess for RWMA's. Modified Simpson's rule using a short axis stack was used to calculate an ejection fraction on a dedicated work [employer] officer, [employer]. The patient received 10 cc of Gadavist. After 10 minutes inversion recovery sequences were used to assess for infiltration and scar tissue. Flow quantification was performed 2 times during this examination with flow quantification performed at the levels of the ascending aorta above the valve, pulmonary artery above the valve. CONTRAST:  10 cc  of Gadavist FINDINGS:  1. Normal left ventricular size, with LVEDD 52 mm, and LVEDVi 67 mL/m2. Normal left ventricular thickness. Mild to moderate decrease in left ventricular systolic function (LVEF =40%). There are no regional wall motion abnormalities, but global hypokinesis. Normal resting perfusion. Left ventricular parametric mapping notable for normal T2 and ECV. There is no late gadolinium enhancement in the left ventricular myocardium. There is no evidence of LV thrombus. 2. Normal right ventricular size with RVEDVI 69 mL/m2. Normal right ventricular thickness. Mild decrease in right ventricular systolic function (RVEF =45%). There is septal and apical hypokinesis with preserved planar excursion. 3.  Normal left and  right atrial size. 4. Normal size of the aortic root, ascending aorta and pulmonary artery. 5. Valve assessment: Aortic Valve: Tri-leaflet aortic valve. Qualitatively, there is no significant regurgitation. Regurgitant fraction <1%. Pulmonic Valve: Qualitatively, there is no significant regurgitation. Regurgitant fraction < 1%. Tricuspid Valve: Qualitatively, there is no significant regurgitation. Regurgitant fraction 1%. Mitral Valve: Qualitatively, there is no significant regurgitation. Regurgitant fraction 1%. 6.  Normal pericardium.  No pericardial effusion. 7. Bilateral dependent ground glass opacities and consolidation. Recommended dedicated study if concerned for non-cardiac pathology. 8. Breath hold artifacts noted. Study was performed free breathing. This decreased the sensitivity of volumetric assessments. IMPRESSION: 1. Mild to moderate decrease in left ventricular systolic function (LVEF =40%). 2. There is no late gadolinium enhancement in the left ventricular myocardium. Normal parametric mapping and tissue characterization. 3. Mild decrease in right ventricular systolic function (RVEF =45%). Riley Lam MD Electronically Signed   By: Riley Lam M.D.   On: 08/29/2023 18:37   DG  CHEST PORT 1 VIEW Addendum Date: 08/27/2023 ADDENDUM REPORT: 08/27/2023 10:19 ADDENDUM: Critical Value/emergent results were called by telephone at the time of interpretation on 08/27/2023 at 10:18 am to nurse Alissa, who verbally acknowledged these results and will contact the caring service urgently. Electronically Signed   By: Karen Kays M.D.   On: 08/27/2023 10:19   Result Date: 08/27/2023 CLINICAL DATA:  Respiratory failure EXAM: PORTABLE CHEST 1 VIEW COMPARISON:  Chest x-ray 08/26/2023 and older FINDINGS: Stable enteric tube and right IJ line. New left IJ line with tip at the right hilum, Swan-Ganz catheter. ET tube in place but is somewhat high, above the clavicles and approximately 11 cm above the carina. Recommend this be advanced 5 cm and repeat x-ray. Increasing vascular congestion. Normal cardiopericardial silhouette. No pneumothorax or effusion. Overlapping cardiac leads. Critical Value/emergent results were called by telephone at the time of interpretation on 08/27/2023 at 10:13 am to provider [facility] , who verbally acknowledged these results. IMPRESSION: High position of the ET tube with tip well above the clavicles. Recommend advancement by 5 cm and repeat x-ray. Increasing vascular congestion. New left IJ Swan-Ganz catheter with tip at the right lung hilum. No pneumothorax Electronically Signed: By: Karen Kays M.D. On: 08/27/2023 10:13   CARDIAC CATHETERIZATION Result Date: 08/26/2023   Ost LAD to Prox LAD lesion is 30% stenosed. HEMODYNAMICS:. RA:   12 mmHg (mean) RV:   35/10-14 mmHg PA:   38/20 mmHg (26 mean) PCWP:  14 mmHg (mean)    Estimated Fick CO/CI: 4.3 L/min, 1.94 L/min/m2  Thermodilution CO/CI: 4.39 L/min, 1.98 L/min/m2    TPG    12  mmHg     PVR     2.8 Wood Units PAPi      1.5  LVEDP: 18 IMPRESSION: Severely elevated pre capillary filling pressures Hemodynamics (PAPi, CVP/PCWP ratio) consistent with suboptimal RV function. Mild nonobstructive coronary artery disease.    ECHOCARDIOGRAM COMPLETE Result Date: 08/26/2023    ECHOCARDIOGRAM REPORT   Patient Name:   Johnathan Ross Date of Exam: 08/26/2023 Medical Rec #:  [id number]        Height:       73.0 in Accession #:    [id number]       Weight:       216.5 lb Date of Birth:  06-26-1970        BSA:          2.225 m Patient Age:    53 years  BP:           110/65 mmHg Patient Gender: M                HR:           79 bpm. Exam Location:  Inpatient Procedure: 2D Echo, Color Doppler, Cardiac Doppler and Intracardiac            Opacification Agent (Both Spectral and Color Flow Doppler were            utilized during procedure). Indications:    Cardiac Arrest I46.9  History:        Patient has no prior history of Echocardiogram examinations.                 Risk Factors:Diabetes and Dyslipidemia.  Sonographer:    Harriette Bouillon RDCS Referring Phys: [phone] LAURA R GLEASON IMPRESSIONS  1. No left ventricular thrombus is seen (Definity contrast used). Left ventricular ejection fraction, by estimation, is 30 to 35%. The left ventricle has moderately decreased function. The left ventricle demonstrates global hypokinesis. Left ventricular  diastolic parameters are indeterminate.  2. Right ventricular systolic function is mildly reduced. The right ventricular size is normal.  3. The mitral valve is normal in structure. No evidence of mitral valve regurgitation. No evidence of mitral stenosis.  4. The aortic valve is tricuspid. Aortic valve regurgitation is not visualized. No aortic stenosis is present. FINDINGS  Left Ventricle: No left ventricular thrombus is seen (Definity contrast used). Left ventricular ejection fraction, by estimation, is 30 to 35%. The left ventricle has moderately decreased function. The left ventricle demonstrates global hypokinesis. The  left ventricular internal cavity size was normal in size. There is no left ventricular hypertrophy. Left ventricular diastolic parameters are indeterminate. Right Ventricle:  The right ventricular size is normal. No increase in right ventricular wall thickness. Right ventricular systolic function is mildly reduced. Left Atrium: Left atrial size was normal in size. Right Atrium: Right atrial size was normal in size. Pericardium: There is no evidence of pericardial effusion. Mitral Valve: The mitral valve is normal in structure. No evidence of mitral valve regurgitation. No evidence of mitral valve stenosis. Tricuspid Valve: The tricuspid valve is normal in structure. Tricuspid valve regurgitation is not demonstrated. Aortic Valve: The aortic valve is tricuspid. Aortic valve regurgitation is not visualized. No aortic stenosis is present. Pulmonic Valve: The pulmonic valve was normal in structure. Pulmonic valve regurgitation is not visualized. No evidence of pulmonic stenosis. Aorta: The aortic root and ascending aorta are structurally normal, with no evidence of dilitation. IAS/Shunts: No atrial level shunt detected by color flow Doppler.  LEFT VENTRICLE PLAX 2D LVIDd:         5.20 cm      Diastology LVIDs:         4.80 cm      LV e' medial:    8.38 cm/s LV PW:         0.90 cm      LV E/e' medial:  6.6 LV IVS:        0.90 cm      LV e' lateral:   8.05 cm/s LVOT diam:     1.90 cm      LV E/e' lateral: 6.8 LV SV:         37 LV SV Index:   16 LVOT Area:     2.84 cm  LV Volumes (MOD) LV vol d, MOD A2C: 97.5 ml LV vol d, MOD A4C:  152.0 ml LV vol s, MOD A2C: 72.2 ml LV vol s, MOD A4C: 113.0 ml LV SV MOD A2C:     25.3 ml LV SV MOD A4C:     152.0 ml LV SV MOD BP:      27.2 ml RIGHT VENTRICLE             IVC RV S prime:     10.00 cm/s  IVC diam: 1.90 cm TAPSE (M-mode): 1.5 cm LEFT ATRIUM             Index        RIGHT ATRIUM          Index LA diam:        2.90 cm 1.30 cm/m   RA Area:     8.44 cm LA Vol (A2C):   48.8 ml 21.93 ml/m  RA Volume:   17.40 ml 7.82 ml/m LA Vol (A4C):   24.1 ml 10.83 ml/m LA Biplane Vol: 34.8 ml 15.64 ml/m  AORTIC VALVE LVOT Vmax:   84.60 cm/s LVOT Vmean:  62.000  cm/s LVOT VTI:    0.129 m  AORTA Ao Root diam: 2.90 cm Ao Asc diam:  2.80 cm MITRAL VALVE MV Area (PHT): 5.16 cm    SHUNTS MV Decel Time: 147 msec    Systemic VTI:  0.13 m MV E velocity: 55.00 cm/s  Systemic Diam: 1.90 cm MV A velocity: 62.90 cm/s MV E/A ratio:  0.87 Mihai Croitoru MD Electronically signed by Thurmon Fair MD Signature Date/Time: 08/26/2023/1:20:18 PM    Final    US RENAL Result Date: 08/26/2023 CLINICAL DATA:  Acute kidney injury. EXAM: RENAL / URINARY TRACT ULTRASOUND COMPLETE COMPARISON:  None. FINDINGS: Right Kidney: Renal measurements: 9.7 x 3.7 x 5.1 cm = volume: 96 mL. Increased echogenicity. No hydronephrosis. Left Kidney: Renal measurements: 9.8 x 4.5 x 4.7 cm = volume: 108 mL. Increased echogenicity. No hydronephrosis. Bladder: Decompressed by Foley catheter. Other: None. IMPRESSION: No hydronephrosis. Increased renal parenchymal echogenicity suggests medical renal disease. Electronically Signed   By: Kennith Center M.D.   On: 08/26/2023 11:54   EEG adult Result Date: 08/26/2023 Charlsie Quest, MD     08/26/2023 10:37 AM Patient Name: Johnathan Ross MRN: [id number] Epilepsy Attending: Charlsie Quest Referring Physician/Provider: Gleason, Darcella Gasman, PA-C Date: 08/26/2023 Duration: 22.34 mins Patient history: 53 year old male status post cardiac arrest.  EEG evaluate for seizure. Level of alertness:  comatose AEDs during EEG study: Propofol Technical aspects: This EEG study was done with scalp electrodes positioned according to the 10-20 International system of electrode placement. Electrical activity was reviewed with band pass filter of 1-70Hz , sensitivity of 7 uV/mm, display speed of 29mm/sec with a 60Hz  notched filter applied as appropriate. EEG data were recorded continuously and digitally stored.  Video monitoring was available and reviewed as appropriate. Description: EEG showed continuous generalized 3 to 6 Hz theta-delta slowing. Hyperventilation and photic stimulation were  not performed.   ABNORMALITY - Continuous slow, generalized IMPRESSION: This study is suggestive of moderate to severe diffuse encephalopathy. No seizures or epileptiform discharges were seen throughout the recording. Charlsie Quest   DG CHEST PORT 1 VIEW Result Date: 08/26/2023 CLINICAL DATA:  Central line placement. EXAM: PORTABLE CHEST 1 VIEW COMPARISON:  Earlier same day FINDINGS: Endotracheal tube tip is 6.4 cm above the base of the carina. The NG tube passes into the stomach although the distal tip position is not included on the film. Right IJ central line is new  in the interval with tip overlying the low SVC level near the RA junction. The central right greater than left airspace disease is similar to prior. No substantial pleural effusion although neither costophrenic angle has been included on the film. Telemetry leads overlie the chest. IMPRESSION: 1. Right IJ central line tip overlies the low SVC level near the RA junction. No pneumothorax. 2. No other significant interval change. Electronically Signed   By: Kennith Center M.D.   On: 08/26/2023 05:02   CT HEAD WO CONTRAST ( ) Result Date: 08/26/2023 CLINICAL DATA:  Delirium EXAM: CT HEAD WITHOUT CONTRAST TECHNIQUE: Contiguous axial images were obtained from the base of the skull through the vertex without intravenous contrast. RADIATION DOSE REDUCTION: This exam was performed according to the departmental dose-optimization program which includes automated exposure control, adjustment of the mA and/or kV according to patient size and/or use of iterative reconstruction technique. COMPARISON:  MRI head August 20, 2017. FINDINGS: Brain: No evidence of acute infarction, hemorrhage, hydrocephalus, extra-axial collection or mass lesion/mass effect. White matter lesions better characterized on prior MRI. Vascular: No hyperdense vessel. Skull: No acute fracture. Sinuses/Orbits: Mild paranasal sinus mucosal thickening. No acute orbital findings. Other: No  mastoid effusions. IMPRESSION: 1. No evidence of acute intracranial abnormality. 2. White matter lesions better characterized on prior MRI. Electronically Signed   By: Feliberto Harts M.D.   On: 08/26/2023 02:27   CT Angio Chest Pulmonary Embolism (PE) W or WO Contrast Result Date: 08/26/2023 CLINICAL DATA:  High probability pulmonary embolism, cardiopulmonary arrest EXAM: CT ANGIOGRAPHY CHEST WITH CONTRAST TECHNIQUE: Multidetector CT imaging of the chest was performed using the standard protocol during bolus administration of intravenous contrast. Multiplanar CT image reconstructions and MIPs were obtained to evaluate the vascular anatomy. RADIATION DOSE REDUCTION: This exam was performed according to the departmental dose-optimization program which includes automated exposure control, adjustment of the mA and/or kV according to patient size and/or use of iterative reconstruction technique. CONTRAST:  75mL OMNIPAQUE IOHEXOL 350 MG/ML SOLN COMPARISON:  None Available. FINDINGS: Cardiovascular: Adequate opacification of the pulmonary arterial tree. No intraluminal filling defect identified to suggest acute pulmonary embolism. Central pulmonary arteries are of normal caliber. Moderate left anterior descending coronary artery calcification. Cardiac size within normal limits. No pericardial effusion. No significant atherosclerotic calcification within the thoracic aorta. No aortic aneurysm. Mediastinum/Nodes: Endotracheal tube and nasogastric tube are in appropriate position. The thyroid gland is atrophic or absent. No definite pathologic adenopathy though imaging is limited by phase of contrast enhancement. The esophagus is unremarkable. Lungs/Pleura: There is extensive dependent pulmonary consolidation throughout the lungs which may relate to aspiration or multifocal infection. No pneumothorax or pleural effusion. Central airways are widely patent. Upper Abdomen: No acute abnormality. Musculoskeletal: No chest  wall abnormality. No acute or significant osseous findings. Review of the MIP images confirms the above findings. IMPRESSION: 1. No pulmonary embolism. 2. Moderate left anterior descending coronary artery calcification. 3. Extensive dependent pulmonary consolidation throughout the lungs which may relate to aspiration or multifocal infection. Electronically Signed   By: Helyn Numbers M.D.   On: 08/26/2023 02:14   DG Chest Port 1 View Result Date: 08/26/2023 CLINICAL DATA:  Cardiac arrest, post CPR.  ET tube placement EXAM: PORTABLE CHEST 1 VIEW COMPARISON:  08/25/2023 FINDINGS: Endotracheal tube is 5 cm above the carina. OG tube enters the stomach. Heart and mediastinal contours are within normal limits. Perihilar airspace opacities may reflect edema. No effusions or pneumothorax. No acute bony abnormality. IMPRESSION: Support devices in expected position  as above. Bilateral perihilar opacities may reflect edema. Electronically Signed   By: Charlett Nose M.D.   On: 08/26/2023 01:47   DG Abd Portable 1V Result Date: 08/26/2023 CLINICAL DATA:  OG tube placement EXAM: PORTABLE ABDOMEN - 1 VIEW COMPARISON:  None Available. FINDINGS: OG tube tip in the stomach.  Nonobstructive bowel gas pattern. IMPRESSION: OG tube in the stomach. Electronically Signed   By: Charlett Nose M.D.   On: 08/26/2023 01:46   DG Chest Port 1 View Result Date: 08/25/2023 CLINICAL DATA:  Shortness of breath EXAM: PORTABLE CHEST 1 VIEW COMPARISON:  March 02, 2022 FINDINGS: Evaluation is limited by positioning. The cardiomediastinal silhouette is unchanged in contour. No pleural effusion. No pneumothorax. No acute pleuroparenchymal abnormality. IMPRESSION: No acute cardiopulmonary abnormality. Electronically Signed   By: Meda Klinefelter M.D.   On: 08/25/2023 14:10   Microbiology: Results for orders placed or performed during the hospital encounter of 08/26/23  MRSA Next Gen by PCR, Nasal     Status: None   Collection Time: 08/26/23   3:18 AM   Specimen: Nasal Mucosa; Nasal Swab  Result Value Ref Range Status   MRSA by PCR Next Gen NOT DETECTED NOT DETECTED Final    Comment: (NOTE) The GeneXpert MRSA Assay (FDA approved for NASAL specimens only), is one component of a comprehensive MRSA colonization surveillance program. It is not intended to diagnose MRSA infection nor to guide or monitor treatment for MRSA infections. Test performance is not FDA approved in patients less than 109 years old. Performed at [facility], [address] [place], [place] [address]   Culture, Respiratory w Gram Stain     Status: None   Collection Time: 08/26/23  3:20 AM   Specimen: Tracheal Aspirate; Respiratory  Result Value Ref Range Status   Specimen Description TRACHEAL ASPIRATE  Final   Special Requests NONE  Final   Gram Stain   Final    ABUNDANT WBC PRESENT, PREDOMINANTLY PMN FEW GRAM POSITIVE COCCI IN PAIRS IN CHAINS    Culture   Final    MODERATE Normal respiratory flora-no Staph aureus or Pseudomonas seen Performed at [facility], [address] [place], [place] [address]    Report Status 08/28/2023 FINAL  Final  Culture, blood (Routine X 2) w Reflex to ID Panel     Status: None   Collection Time: 08/26/23  5:32 AM   Specimen: BLOOD RIGHT HAND  Result Value Ref Range Status   Specimen Description BLOOD RIGHT HAND  Final   Special Requests   Final    BOTTLES DRAWN AEROBIC AND ANAEROBIC Blood Culture results may not be optimal due to an inadequate volume of blood received in culture bottles   Culture   Final    NO GROWTH 5 DAYS Performed at [facility], [address], [place], [place] [address]    Report Status 08/31/2023 FINAL  Final  Culture, blood (Routine X 2) w Reflex to ID Panel     Status: None   Collection Time: 08/26/23  5:35 AM   Specimen: BLOOD RIGHT ARM  Result Value Ref Range Status   Specimen Description BLOOD RIGHT ARM  Final   Special Requests   Final    BOTTLES DRAWN AEROBIC  ONLY Blood Culture results may not be optimal due to an inadequate volume of blood received in culture bottles   Culture   Final    NO GROWTH 5 DAYS Performed at [facility],  [address], [place], [place] [address]    Report Status 08/31/2023 FINAL  Final  Surgical PCR screen     Status: None   Collection Time: 08/30/23  2:49 PM   Specimen: Nasal Mucosa; Nasal Swab  Result Value Ref Range Status   MRSA, PCR NEGATIVE NEGATIVE Final   Staphylococcus aureus NEGATIVE NEGATIVE Final    Comment: (NOTE) The Xpert SA Assay (FDA approved for NASAL specimens in patients 41 years of age and older), is one component of a comprehensive surveillance program. It is not intended to diagnose infection nor to guide or monitor treatment. Performed at [facility], [address] [place], [place] [address]    *Note: Due to a large number of results and/or encounters for the requested time period, some results have not been displayed. A complete set of results can be found in Results Review.   Labs: CBC: Recent Labs  Lab 08/29/23 0544 08/30/23 0549 08/31/23 0503 09/01/23 0408 09/02/23 0520  WBC 18.4* 12.7* 17.6* 22.1* 12.6*  NEUTROABS  --   --  15.9* 17.8* 8.7*  HGB 13.1 13.5 13.7 13.4 14.7  HCT 39.0 39.7 40.0 40.1 44.7  MCV 85.7 84.8 83.9 85.9 87.6  PLT 180 173 219 256 249   Basic Metabolic Panel: Recent Labs  Lab 08/29/23 0544 08/30/23 0549 08/31/23 0503 09/01/23 0408 09/02/23 0520  NA 134* 136 136 139 137  K 4.3 4.1 4.4 3.9 4.1  CL 97* 98 101 102 99  CO2 26 28 27 28 24   GLUCOSE 143* 149* 236* 52* 114*  BUN 18 16 26* 23* 19  CREATININE 0.93 1.00 1.32* 1.21 1.03  CALCIUM 8.2* 8.4* 8.8* 8.6* 8.7*  MG 1.9 2.0 2.1 1.9 1.8  PHOS  --   --  3.3 4.5 3.8   Liver Function Tests: Recent Labs  Lab 08/31/23 0503 09/01/23 0408 09/02/23 0520  AST 45* 32 35  ALT 52* 40 44  ALKPHOS 84 76 77  BILITOT 0.5 0.7 0.6  PROT 5.9* 5.4* 6.0*  ALBUMIN 2.7* 2.7* 3.0*    CBG: Recent Labs  Lab 09/02/23 1951 09/03/23 0003 09/03/23 0407 09/03/23 0740 09/03/23 1157  GLUCAP 372* 220* 149* 152* 334*   Discharge time spent: greater than 30 minutes.  Signed: Marguerita Merles, DO [facility] 09/03/2023

## 2023-09-03 NOTE — Plan of Care (Signed)
  Problem: Education: Goal: Knowledge of General Education information will improve Description: Including pain rating scale, medication(s)/side effects and non-pharmacologic comfort measures Outcome: Progressing   Problem: Health Behavior/Discharge Planning: Goal: Ability to manage health-related needs will improve Outcome: Progressing   Problem: Clinical Measurements: Goal: Ability to maintain clinical measurements within normal limits will improve Outcome: Progressing Goal: Will remain free from infection Outcome: Progressing Goal: Diagnostic test results will improve Outcome: Progressing Goal: Respiratory complications will improve Outcome: Progressing Goal: Cardiovascular complication will be avoided Outcome: Progressing   Problem: Activity: Goal: Risk for activity intolerance will decrease Outcome: Progressing   Problem: Nutrition: Goal: Adequate nutrition will be maintained Outcome: Progressing   Problem: Coping: Goal: Level of anxiety will decrease Outcome: Progressing   Problem: Elimination: Goal: Will not experience complications related to bowel motility Outcome: Progressing Goal: Will not experience complications related to urinary retention Outcome: Progressing   Problem: Pain Managment: Goal: General experience of comfort will improve and/or be controlled Outcome: Progressing   Problem: Safety: Goal: Ability to remain free from injury will improve Outcome: Progressing   Problem: Skin Integrity: Goal: Risk for impaired skin integrity will decrease Outcome: Progressing   Problem: Education: Goal: Ability to manage disease process will improve Outcome: Progressing   Problem: Cardiac: Goal: Ability to achieve and maintain adequate cardiopulmonary perfusion will improve Outcome: Progressing   Problem: Neurologic: Goal: Promote progressive neurologic recovery Outcome: Progressing   Problem: Skin Integrity: Goal: Risk for impaired skin integrity  will be minimized. Outcome: Progressing   Problem: Education: Goal: Understanding of CV disease, CV risk reduction, and recovery process will improve Outcome: Progressing Goal: Individualized Educational Video(s) Outcome: Progressing   Problem: Activity: Goal: Ability to return to baseline activity level will improve Outcome: Progressing   Problem: Cardiovascular: Goal: Ability to achieve and maintain adequate cardiovascular perfusion will improve Outcome: Progressing Goal: Vascular access site(s) Level 0-1 will be maintained Outcome: Progressing   Problem: Health Behavior/Discharge Planning: Goal: Ability to safely manage health-related needs after discharge will improve Outcome: Progressing   Problem: Education: Goal: Ability to describe self-care measures that may prevent or decrease complications (Diabetes Survival Skills Education) will improve Outcome: Progressing Goal: Individualized Educational Video(s) Outcome: Progressing   Problem: Coping: Goal: Ability to adjust to condition or change in health will improve Outcome: Progressing   Problem: Fluid Volume: Goal: Ability to maintain a balanced intake and output will improve Outcome: Progressing   Problem: Health Behavior/Discharge Planning: Goal: Ability to identify and utilize available resources and services will improve Outcome: Progressing Goal: Ability to manage health-related needs will improve Outcome: Progressing   Problem: Metabolic: Goal: Ability to maintain appropriate glucose levels will improve Outcome: Progressing   Problem: Nutritional: Goal: Maintenance of adequate nutrition will improve Outcome: Progressing Goal: Progress toward achieving an optimal weight will improve Outcome: Progressing   Problem: Skin Integrity: Goal: Risk for impaired skin integrity will decrease Outcome: Progressing   Problem: Tissue Perfusion: Goal: Adequacy of tissue perfusion will improve Outcome:  Progressing   Problem: Education: Goal: Knowledge of cardiac device and self-care will improve Outcome: Progressing Goal: Ability to safely manage health related needs after discharge will improve Outcome: Progressing Goal: Individualized Educational Video(s) Outcome: Progressing   Problem: Cardiac: Goal: Ability to achieve and maintain adequate cardiopulmonary perfusion will improve Outcome: Progressing

## 2023-09-03 NOTE — Progress Notes (Signed)
 Patient does not have an insulin pump on. He does not have all the pieces because some are at home. So patient needs to be given insulin like a regular DM patient until he gets his insulin pump on. Explained to patient to please let us know when he is going to start using pump and he needs to sign the release. Will pass on to day shift.

## 2023-09-03 NOTE — H&P (Signed)
 Physical Medicine and Rehabilitation Admission H&P        Chief Complaint  Patient presents with   Cardiac Arrest  : HPI: Johnathan Ross is a 53 year old right-handed male with history of hyperlipidemia, hypertension, hypothyroidism, diabetes mellitus with proliferative diabetic retinopathy and has a insulin pump, multiple sclerosis followed by neurology service Dr. Epimenio Foot maintained on Dalfampridine.  Per chart review patient lives with girlfriend.  1 level home 4 steps to entry.  Girlfriend works outside the home.  Modified independent mobility.  Modified independent with ADLs.  Presented 08/26/2023 after collapsing in the community and receiving CPR before he was found by EMS.  Patient was found to have ventricular fibrillation.  He went through 20 minutes of ACLS protocol, was intubated and brought to the emergency room was found to be in sinus rhythm with prolonged QTc.  CT of the head and CTA of the chest did not reveal any acute abnormalities although there was some concern of aspiration in the lungs.  He remained intubated 08/26/2023-08/27/2023.  Echocardiogram showed ejection fraction of 30 to 35%.  The left ventricle demonstrating global hypokinesis.  EEG suggestive of moderate to severe diffuse encephalopathy no seizure.  Admission chemistries unremarkable except glucose 150, troponin 290-1 50, urine drug screen positive benzos.  Postarrest EKG normal sinus rhythm with low voltage in short P-RR interval.  No STE.  Cardiac MRI done showed no evidence of LGE but did show some mild to moderate decrease in left ventricular systolic function of 40% and a mild decrease in right ventricular systolic function.  Underwent cardiac catheterization showing mild nonobstructive CAD severely elevated precapillary filling pressures.  Hemodynamics consistent with suboptimal RV function.  He was started on milrinone.  Hospital course followed closely by cardiology services underwent ICD/PPM 08/30/2023.  Elevated  leukocytosis 27,100 as well as suggestion of possible aspiration placed on Rocephin per CCM.  It was felt that possibly elevated leukocytosis was due to to steroid demargination received during pacer placement.  He was cleared to begin subcutaneous heparin for DVT prophylaxis.  AKI on CKD stage III latest creatinine 1.21.  Abnormal LFTs in the setting of shock and cardiac arrest and monitored.  He is tolerating a regular diet.  Therapy evaluations completed due to patient decreased functional mobility was admitted for a comprehensive rehab program.   Review of Systems  Constitutional:  Positive for malaise/fatigue. Negative for fever.  HENT:  Negative for hearing loss.   Eyes:  Positive for blurred vision. Negative for double vision.  Respiratory:  Positive for cough and shortness of breath.   Cardiovascular:  Positive for chest pain. Negative for leg swelling.  Gastrointestinal:  Positive for constipation and nausea.  Genitourinary:  Negative for dysuria, flank pain and hematuria.  Musculoskeletal:  Positive for joint pain and myalgias.  Skin:  Negative for rash.  Neurological:  Positive for focal weakness and weakness.  All other systems reviewed and are negative.       Past Medical History:  Diagnosis Date   Hyperlipidemia     Hypertension     Hypogonadism in male 11/01/2016   Hypothyroidism     Multiple sclerosis (HCC)     Neuromuscular disorder (HCC)     Proliferative diabetic retinopathy(362.02)     Type 1 diabetes mellitus (HCC) dx'd 1994   Vitamin D insufficiency 11/29/2016             Past Surgical History:  Procedure Laterality Date   EYE SURGERY Bilateral      "laser  for diabetic retinopathy"   FRACTURE SURGERY       ICD IMPLANT N/A 08/30/2023    Procedure: ICD IMPLANT;  Surgeon: Regan Lemming, MD;  Location: [facility] CV LAB;  Service: Cardiovascular;  Laterality: N/A;   IR VENO/EXT/UNI LEFT   03/24/2020   OPEN REDUCTION INTERNAL FIXATION (ORIF) TIBIA/FIBULA  FRACTURE Right 10/30/2016   ORIF ANKLE FRACTURE Left 2015   ORIF TIBIA FRACTURE Right 10/30/2016    Procedure: OPEN REDUCTION INTERNAL FIXATION (ORIF) TIBIA FIBULA FRACTURE;  Surgeon: Myrene Galas, MD;  Location: [facility] OR;  Service: Orthopedics;  Laterality: Right;   RETINAL LASER PROCEDURE Bilateral      "for diabetic retinopathy"   RIGHT/LEFT HEART CATH AND CORONARY ANGIOGRAPHY N/A 08/26/2023    Procedure: RIGHT/LEFT HEART CATH AND CORONARY ANGIOGRAPHY;  Surgeon: Dorthula Nettles, DO;  Location: [facility] INVASIVE CV LAB;  Service: Cardiovascular;  Laterality: N/A;             Family History  Problem Relation Age of Onset   Diabetes Sister     Colon polyps Maternal Uncle     Colon cancer Maternal Grandmother     Stomach cancer Cousin     Esophageal cancer Neg Hx          Social History:  reports that he has never smoked. He has never used smokeless tobacco. He reports that he does not drink alcohol and does not use drugs. Allergies:  Allergies      Allergies  Allergen Reactions   Zestril [Lisinopril] Cough   Ivp Dye [Iodinated Contrast Media] Hives and Itching            Medications Prior to Admission  Medication Sig Dispense Refill   baclofen (LIORESAL) 10 MG tablet Take 1 tablet (10 mg total) by mouth 3 (three) times daily. 270 tablet 3   dalfampridine 10 MG TB12 Take 1 tablet (10 mg total) by mouth 2 (two) times daily. 180 tablet 3   levocetirizine (XYZAL) 5 MG tablet TAKE 1 TABLET BY MOUTH EVERY DAY IN THE EVENING 90 tablet 1   losartan (COZAAR) 50 MG tablet Take 1 tablet (50 mg total) by mouth daily. 90 tablet 3   NOVOLOG 100 UNIT/ML injection USE UP TO 80 UNITS IN INSULIN PUMP DAILY-DX CODE E10.8 70 mL 2   simvastatin (ZOCOR) 20 MG tablet Take 1 tablet (20 mg total) by mouth daily at 6 PM. 90 tablet 0   SYNTHROID 125 MCG tablet Take 1 tablet by mouth 6 days per week. 78 tablet 3              Home: Home Living Family/patient expects to be discharged to:: Inpatient  rehab Living Arrangements:  (girlfriend of > 20 years) Available Help at Discharge: Family Type of Home: House Home Access: Stairs to enter Secretary/administrator of Steps: 4 Entrance Stairs-Rails: Left, Right, Can reach both Home Layout: One level Bathroom Shower/Tub: Engineer, manufacturing systems: Standard Bathroom Accessibility: Yes Home Equipment: Agricultural consultant (2 wheels), [employer] - single point  Lives With: Significant other   Functional History: Prior Function Prior Level of Function : Independent/Modified Independent, Driving Mobility Comments: walks with furniture or walls at home, RW for long distance; pt and family repot falls in the past, but not recently ADLs Comments: Independent to Mod I with ADls and IADLs; drives; enjoys drawing   Functional Status:  Mobility: Bed Mobility Overal bed mobility: Needs Assistance Bed Mobility: Supine to Sit Supine to sit: Mod assist, HOB elevated General bed  mobility comments: in recliner Transfers Overall transfer level: Needs assistance Equipment used: Bilateral platform walker (eva walker) Transfers: Sit to/from Stand Sit to Stand: Mod assist, Max assist, Min assist, +2 safety/equipment Bed to/from chair/wheelchair/BSC transfer type:: Via Lift equipment Transfer via Lift Equipment: Stedy General transfer comment: heavy UE use for sit to stand and initially mod to max A for lifting help and balance while reaching up to EVA walker, then progressed to mod A of 2 then min A.  Stand to sit pt with knees giving way and landing on edge of chair, some improvement with cues to push hips back on chair Ambulation/Gait Ambulation/Gait assistance: Mod assist, +2 safety/equipment Gait Distance (Feet): 55 Feet Assistive device: Fara Boros Gait Pattern/deviations: Step-to pattern, Knee hyperextension - right, Knee hyperextension - left, Decreased dorsiflexion - right, Decreased dorsiflexion - left, Shuffle General Gait Details: assist to  manage [name] walker, assist for initial step with R to unlock hyperextension and then initially for R weight shift to be able to move L.  Could not move feet initially then placed blue shoe covers to allow feet to slide Gait velocity: decr   ADL: ADL Overall ADL's : Needs assistance/impaired Eating/Feeding: Independent, Sitting Grooming: Oral care, Set up, Sitting Upper Body Bathing: Minimal assistance, Moderate assistance, Bed level, Cueing for compensatory techniques Upper Body Bathing Details (indicate cue type and reason): with assist for line management Lower Body Bathing: Maximal assistance, Bed level, Cueing for compensatory techniques, +2 for physical assistance, +2 for safety/equipment Upper Body Dressing : Maximal assistance, Sitting Upper Body Dressing Details (indicate cue type and reason): front opening gown Lower Body Dressing: Contact guard assist, Sitting/lateral leans Lower Body Dressing Details (indicate cue type and reason): doffed shoe covers in sitting with increased time Toilet Transfer Details (indicate cue type and reason): not attempted this session for pt/therapist safety Toileting- Clothing Manipulation and Hygiene: Total assistance, +2 for physical assistance, +2 for safety/equipment, Bed level Functional mobility during ADLs: Moderate assistance, +2 for physical assistance [name] walker) General ADL Comments: Worked on sit to stand at sink, static standing balance at sink reaching with each UE singly. Unable to balance in static standing without at lease one hand support.   Cognition: Cognition Overall Cognitive Status: Impaired/Different from baseline Arousal/Alertness: Awake/alert Orientation Level: Oriented X4 (intermitten confusion/ forgetfulness) Year: 2025 Month: April Day of Week: Correct Attention: Sustained, Selective Sustained Attention: Impaired Sustained Attention Impairment: Verbal complex Selective Attention: Impaired Selective Attention  Impairment: Verbal complex, Verbal basic Memory: Impaired Memory Impairment: Storage deficit, Retrieval deficit Awareness: Impaired Awareness Impairment: Emergent impairment Problem Solving: Impaired Problem Solving Impairment: Verbal complex Behaviors: Impulsive Safety/Judgment: Impaired Cognition Arousal: Alert Behavior During Therapy: Impulsive (mildly) Overall Cognitive Status: Impaired/Different from baseline   Physical Exam: Blood pressure 102/70, pulse 71, temperature 98 F (36.7 C), temperature source Oral, resp. rate 16, height 6\' 1"  (1.854 m), weight 99.1 kg, SpO2 97%. Physical Exam Constitutional:      General: He is not in acute distress. HENT:     Head: Normocephalic.     Right Ear: External ear normal.     Left Ear: External ear normal.     Nose: Nose normal.     Mouth/Throat:     Mouth: Mucous membranes are moist.  Eyes:     Extraocular Movements: Extraocular movements intact.     Conjunctiva/sclera: Conjunctivae normal.     Pupils: Pupils are equal, round, and reactive to light.  Cardiovascular:     Rate and Rhythm: Normal rate and regular  rhythm.     Heart sounds: No murmur heard.    No gallop.  Pulmonary:     Effort: Pulmonary effort is normal. No respiratory distress.     Breath sounds: No wheezing.  Abdominal:     General: Bowel sounds are normal. There is no distension.  Musculoskeletal:        General: No swelling or tenderness. Normal range of motion.     Cervical back: Normal range of motion.  Skin:    General: Skin is warm.     Comments: PPM/ICD site CDI  Neurological:     Mental Status: He is alert.     Comments: Pt is alert and oriented to name, age, month/year. Remembered me from yesterday but demonstrates ongoing STM deficits. Impaired to fair insight and awareness. CN exam non-focal. MMT: BUE 4/5 prox to distal. BLE 2/5 HF, 3- to 3/5 KE and 0-tr ADF/PF. Senses pain and light touch in all 4 limbs. DTR's brisk. No abnl resting tone.        Psychiatric:     Comments: Generally pleasant, can be a little labile.         Lab Results Last 48 Hours        Results for orders placed or performed during the hospital encounter of 08/26/23 (from the past 48 hours)  Glucose, capillary     Status: Abnormal    Collection Time: 09/01/23 12:05 PM  Result Value Ref Range    Glucose-Capillary 224 (H) 70 - 99 mg/dL      Comment: Glucose reference range applies only to samples taken after fasting for at least 8 hours.  Glucose, capillary     Status: Abnormal    Collection Time: 09/01/23  1:43 PM  Result Value Ref Range    Glucose-Capillary 190 (H) 70 - 99 mg/dL      Comment: Glucose reference range applies only to samples taken after fasting for at least 8 hours.  Glucose, capillary     Status: Abnormal    Collection Time: 09/01/23  4:47 PM  Result Value Ref Range    Glucose-Capillary 374 (H) 70 - 99 mg/dL      Comment: Glucose reference range applies only to samples taken after fasting for at least 8 hours.  Glucose, capillary     Status: Abnormal    Collection Time: 09/01/23  8:33 PM  Result Value Ref Range    Glucose-Capillary 291 (H) 70 - 99 mg/dL      Comment: Glucose reference range applies only to samples taken after fasting for at least 8 hours.  Glucose, capillary     Status: Abnormal    Collection Time: 09/01/23 11:33 PM  Result Value Ref Range    Glucose-Capillary 145 (H) 70 - 99 mg/dL      Comment: Glucose reference range applies only to samples taken after fasting for at least 8 hours.  Glucose, capillary     Status: Abnormal    Collection Time: 09/02/23  4:27 AM  Result Value Ref Range    Glucose-Capillary 63 (L) 70 - 99 mg/dL      Comment: Glucose reference range applies only to samples taken after fasting for at least 8 hours.  Glucose, capillary     Status: None    Collection Time: 09/02/23  5:06 AM  Result Value Ref Range    Glucose-Capillary 87 70 - 99 mg/dL      Comment: Glucose reference range applies only  to samples taken after fasting for  at least 8 hours.  CBC with Differential/Platelet     Status: Abnormal    Collection Time: 09/02/23  5:20 AM  Result Value Ref Range    WBC 12.6 (H) 4.0 - 10.5 K/uL    RBC 5.10 4.22 - 5.81 MIL/uL    Hemoglobin 14.7 13.0 - 17.0 g/dL    HCT 24.4 01.0 - 27.2 %    MCV 87.6 80.0 - 100.0 fL    MCH 28.8 26.0 - 34.0 pg    MCHC 32.9 30.0 - 36.0 g/dL    RDW 53.6 64.4 - 03.4 %    Platelets 249 150 - 400 K/uL    nRBC 0.0 0.0 - 0.2 %    Neutrophils Relative % 69 %    Neutro Abs 8.7 (H) 1.7 - 7.7 K/uL    Lymphocytes Relative 15 %    Lymphs Abs 1.9 0.7 - 4.0 K/uL    Monocytes Relative 10 %    Monocytes Absolute 1.2 (H) 0.1 - 1.0 K/uL    Eosinophils Relative 2 %    Eosinophils Absolute 0.3 0.0 - 0.5 K/uL    Basophils Relative 0 %    Basophils Absolute 0.1 0.0 - 0.1 K/uL    Immature Granulocytes 4 %    Abs Immature Granulocytes 0.47 (H) 0.00 - 0.07 K/uL      Comment: Performed at [facility], [address], [place], [place] [address]  Comprehensive metabolic panel with GFR     Status: Abnormal    Collection Time: 09/02/23  5:20 AM  Result Value Ref Range    Sodium 137 135 - 145 mmol/L    Potassium 4.1 3.5 - 5.1 mmol/L    Chloride 99 98 - 111 mmol/L    CO2 24 22 - 32 mmol/L    Glucose, Bld 114 (H) 70 - 99 mg/dL      Comment: Glucose reference range applies only to samples taken after fasting for at least 8 hours.    BUN 19 6 - 20 mg/dL    Creatinine, Ser 5.63 0.61 - 1.24 mg/dL    Calcium 8.7 (L) 8.9 - 10.3 mg/dL    Total Protein 6.0 (L) 6.5 - 8.1 g/dL    Albumin 3.0 (L) 3.5 - 5.0 g/dL    AST 35 15 - 41 U/L    ALT 44 0 - 44 U/L    Alkaline Phosphatase 77 38 - 126 U/L    Total Bilirubin 0.6 0.0 - 1.2 mg/dL    GFR, Estimated >87 >56 mL/min      Comment: (NOTE) Calculated using the CKD-EPI Creatinine Equation (2021)      Anion gap 14 5 - 15      Comment: Performed at [facility], [address], [place], [place] [address]  Phosphorus      Status: None    Collection Time: 09/02/23  5:20 AM  Result Value Ref Range    Phosphorus 3.8 2.5 - 4.6 mg/dL      Comment: Performed at [facility], [address] [place], [place] [address]  Magnesium     Status: None    Collection Time: 09/02/23  5:20 AM  Result Value Ref Range    Magnesium 1.8 1.7 - 2.4 mg/dL      Comment: Performed at [facility], [address] [place], [place] [address]  Glucose, capillary     Status: Abnormal    Collection Time: 09/02/23  7:43 AM  Result  Value Ref Range    Glucose-Capillary 169 (H) 70 - 99 mg/dL      Comment: Glucose reference range applies only to samples taken after fasting for at least 8 hours.  Glucose, capillary     Status: Abnormal    Collection Time: 09/02/23 10:01 AM  Result Value Ref Range    Glucose-Capillary 313 (H) 70 - 99 mg/dL      Comment: Glucose reference range applies only to samples taken after fasting for at least 8 hours.  Glucose, capillary     Status: Abnormal    Collection Time: 09/02/23 12:37 PM  Result Value Ref Range    Glucose-Capillary 265 (H) 70 - 99 mg/dL      Comment: Glucose reference range applies only to samples taken after fasting for at least 8 hours.  Glucose, capillary     Status: Abnormal    Collection Time: 09/02/23  4:37 PM  Result Value Ref Range    Glucose-Capillary 314 (H) 70 - 99 mg/dL      Comment: Glucose reference range applies only to samples taken after fasting for at least 8 hours.  Glucose, capillary     Status: Abnormal    Collection Time: 09/02/23  7:51 PM  Result Value Ref Range    Glucose-Capillary 372 (H) 70 - 99 mg/dL      Comment: Glucose reference range applies only to samples taken after fasting for at least 8 hours.  Glucose, capillary     Status: Abnormal    Collection Time: 09/03/23 12:03 AM  Result Value Ref Range    Glucose-Capillary 220 (H) 70 - 99 mg/dL      Comment: Glucose reference range applies only to samples taken after fasting for at least 8  hours.  Glucose, capillary     Status: Abnormal    Collection Time: 09/03/23  4:07 AM  Result Value Ref Range    Glucose-Capillary 149 (H) 70 - 99 mg/dL      Comment: Glucose reference range applies only to samples taken after fasting for at least 8 hours.  Glucose, capillary     Status: Abnormal    Collection Time: 09/03/23  7:40 AM  Result Value Ref Range    Glucose-Capillary 152 (H) 70 - 99 mg/dL      Comment: Glucose reference range applies only to samples taken after fasting for at least 8 hours.    *Note: Due to a large number of results and/or encounters for the requested time period, some results have not been displayed. A complete set of results can be found in Results Review.      Imaging Results (Last 48 hours)  No results found.           Blood pressure 102/70, pulse 71, temperature 98 F (36.7 C), temperature source Oral, resp. rate 16, height 6\' 1"  (1.854 m), weight 99.1 kg, SpO2 97%.   Medical Problem List and Plan: 1. Functional deficits secondary to cardiac arrest/V-fib/V. tach with anoxic encephalopathy             -patient may shower             -ELOS/Goals: 13-20 days, supervision to min assist goals with PT, OT, SLP             -pt has a pair of ipsilon AFO's. Given his profound ankle weakness in addition to his weakness proximally, he needs a more supportive AFO ([place]er?). PT and orthotist to assess.  2.  Antithrombotics: -DVT/anticoagulation:  Pharmaceutical: Heparin             -antiplatelet therapy: N/A 3. Pain Management: Tylenol as needed 4. Mood/Behavior/Sleep: Provide emotional support             -antipsychotic agents: N/A 5. Neuropsych/cognition: This patient is not capable of making decisions on his own behalf. 6. Skin/Wound Care: Routine skin checks 7. Fluids/Electrolytes/Nutrition: Routine in and outs with follow-up chemistries             -pt appears to have good appetite 8.  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   Status post ICD 08/30/2023.   -continue Amiodarone 400 mg daily, Toprol-XL 25 mg daily, Entresto 49-51 mg twice daily, Aldactone 25 mg daily 9.  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  Latest hemoglobin A1c 7.3.  Semglee 35 units daily.   -Presently not using insulin pump. Have advised pt/family to hold off on pump until his memory and overall cognition is improved.  10.  Hypothyroidism.  Synthroid 11.  History of multiple sclerosis.  Followed by neurology services Dr. Epimenio Foot..  Continue Dalfampridine 10 mg twice daily as well as Requip for restless legs             -pt states that restless legs have been better while in hospital 12.  AKI on CKD stage III.  Follow-up chemistries tomorrow     Charlton Amor, PA-C 09/03/2023  I have personally performed a face to face diagnostic evaluation of this patient and formulated the key components of the plan.  Additionally, I have personally reviewed laboratory data, imaging studies, as well as relevant notes and concur with the physician assistant's documentation above.  The patient's status has not changed from the original H&P.  Any changes in documentation from the acute care chart have been noted above.  Ranelle Oyster, MD, [place]

## 2023-09-03 NOTE — Progress Notes (Signed)
 Ranelle Oyster, MD  Physician Physical Medicine and Rehabilitation   Consult Note    Signed   Date of Service: 08/30/2023 11:11 AM  Related encounter: ED to Hosp-Admission (Current) from 08/26/2023 in [facility]   Signed     Expand All Collapse All  Show:Clear all [x] Written[x] Templated[] Copied  Added by: [x] Ranelle Oyster, MD  [] Hover for details          Physical Medicine and Rehabilitation Consult Reason for Consult: Impaired functional mobility Referring Physician: Marland Mcalpine     HPI: DEJUAN ELMAN is a 53 y.o. male with a history of hypertension, relapsing remitting MS, and type 1 diabetes who was admitted on 08/26/2023 after collapsing in the community and receiving CPR before he was found by EMS.  Patient was found to have ventricular fibrillation.  He went through 20 minutes of ACLS protocol, was intubated and brought to the emergency room was found to be in sinus rhythm with prolonged QTc.  CT of the head and CTA of the chest did not reveal any acute abnormalities although there was some concern over aspiration in the lungs.  Patient with postarrest encephalopathy which is showing some signs of improvement.  He was placed on ceftriaxone for pneumonia.  He had a cardiac MRI performed yesterday and no scar or right ventricular abnormalities noted.  He continues on IV amiodarone and is n.p.o. for a permanent pacemaker to be placed today.  Patient was up with therapy yesterday and transferred sit to stand with min to mod assist.  They addressed standing with rolling walker and later with stedy.  Gait was not assessed due to lower extremity weakness.  Per therapy girlfriend is to bring in his AFOs.  Patient lives with family and 1 level house with 4 steps to enter.  Patient uses a rolling walker at times and also furniture walked at home.  He was driving and modified independent   Review of Systems  Constitutional:  Positive for malaise/fatigue.   HENT: Negative.    Eyes:  Negative for double vision.  Respiratory:  Positive for cough and shortness of breath.   Cardiovascular:  Positive for chest pain.  Gastrointestinal:  Positive for nausea.  Genitourinary:  Negative for dysuria.  Musculoskeletal:  Negative for myalgias.  Skin: Negative.   Neurological:  Positive for focal weakness and weakness.  Psychiatric/Behavioral:  Negative for substance abuse.         Past Medical History:  Diagnosis Date   Hyperlipidemia     Hypertension     Hypogonadism in male 11/01/2016   Hypothyroidism     Multiple sclerosis (HCC)     Neuromuscular disorder (HCC)     Proliferative diabetic retinopathy(362.02)     Type 1 diabetes mellitus (HCC) dx'd 1994   Vitamin D insufficiency 11/29/2016             Past Surgical History:  Procedure Laterality Date   EYE SURGERY Bilateral      "laser for diabetic retinopathy"   FRACTURE SURGERY       IR VENO/EXT/UNI LEFT   03/24/2020   OPEN REDUCTION INTERNAL FIXATION (ORIF) TIBIA/FIBULA FRACTURE Right 10/30/2016   ORIF ANKLE FRACTURE Left 2015   ORIF TIBIA FRACTURE Right 10/30/2016    Procedure: OPEN REDUCTION INTERNAL FIXATION (ORIF) TIBIA FIBULA FRACTURE;  Surgeon: Myrene Galas, MD;  Location: [facility] OR;  Service: Orthopedics;  Laterality: Right;   RETINAL LASER PROCEDURE Bilateral      "for diabetic retinopathy"  RIGHT/LEFT HEART CATH AND CORONARY ANGIOGRAPHY N/A 08/26/2023    Procedure: RIGHT/LEFT HEART CATH AND CORONARY ANGIOGRAPHY;  Surgeon: Dorthula Nettles, DO;  Location: [facility] INVASIVE CV LAB;  Service: Cardiovascular;  Laterality: N/A;             Family History  Problem Relation Age of Onset   Diabetes Sister     Colon polyps Maternal Uncle     Colon cancer Maternal Grandmother     Stomach cancer Cousin     Esophageal cancer Neg Hx          Social History:  reports that he has never smoked. He has never used smokeless tobacco. He reports that he does not drink alcohol and does not  use drugs. Allergies:  Allergies      Allergies  Allergen Reactions   Zestril [Lisinopril] Cough   Ivp Dye [Iodinated Contrast Media] Hives and Itching            Medications Prior to Admission  Medication Sig Dispense Refill   baclofen (LIORESAL) 10 MG tablet Take 1 tablet (10 mg total) by mouth 3 (three) times daily. 270 tablet 3   dalfampridine 10 MG TB12 Take 1 tablet (10 mg total) by mouth 2 (two) times daily. 180 tablet 3   levocetirizine (XYZAL) 5 MG tablet TAKE 1 TABLET BY MOUTH EVERY DAY IN THE EVENING 90 tablet 1   losartan (COZAAR) 50 MG tablet Take 1 tablet (50 mg total) by mouth daily. 90 tablet 3   NOVOLOG 100 UNIT/ML injection USE UP TO 80 UNITS IN INSULIN PUMP DAILY-DX CODE E10.8 70 mL 2   simvastatin (ZOCOR) 20 MG tablet Take 1 tablet (20 mg total) by mouth daily at 6 PM. 90 tablet 0   SYNTHROID 125 MCG tablet Take 1 tablet by mouth 6 days per week. 78 tablet 3          Home: Home Living Family/patient expects to be discharged to:: Inpatient rehab Living Arrangements: Spouse/significant other Available Help at Discharge: Family, Available 24 hours/day (girlfriend works outside the home; additional family members also able to provide support) Type of Home: House Home Access: Stairs to enter [employer] of Steps: 4 Entrance Stairs-Rails: Left, Right, Can reach both Home Layout: One level Bathroom Shower/Tub: Engineer, manufacturing systems: Standard Home Equipment: Agricultural consultant (2 wheels), [employer] - single point  Functional History: Prior Function Prior Level of Function : Independent/Modified Independent, Driving Mobility Comments: walks with furniture or walls at home, RW for long distance; pt and family repot falls in the past, but not recently ADLs Comments: Independent to Mod I with ADls and IADLs; drives; enjoys drawing Functional Status:  Mobility: Bed Mobility Overal bed mobility: Needs Assistance Bed Mobility: Supine to Sit Supine to  sit: Mod assist General bed mobility comments: bed in chair position, able to pull trunk forward away from mattress with mod assist Transfers Overall transfer level: Needs assistance Equipment used: Rolling walker (2 wheels), Ambulation equipment used Transfers: Sit to/from Stand, Bed to chair/wheelchair/BSC Sit to Stand: Mod assist, Min assist, From elevated surface, +2 physical assistance Bed to/from chair/wheelchair/BSC transfer type:: Via Lift equipment Transfer via Lift Equipment: Stedy General transfer comment: stood from egress position with RW +2 mod assist with use of pad under pelvis and gait belt; x 2 reps; stood with stedy x 3 reps (min) Ambulation/Gait General Gait Details: not ready due to bil LE weakness; girlfriend to bring in AFOs   ADL: ADL Overall ADL's : Needs assistance/impaired Eating/Feeding:  Set up, Bed level Grooming: Set up, Contact guard assist, Bed level Upper Body Bathing: Minimal assistance, Moderate assistance, Bed level, Cueing for compensatory techniques Upper Body Bathing Details (indicate cue type and reason): with assist for line management Lower Body Bathing: Maximal assistance, Bed level, Cueing for compensatory techniques, +2 for physical assistance, +2 for safety/equipment Upper Body Dressing : Maximal assistance, Sitting Upper Body Dressing Details (indicate cue type and reason): front opening gown Lower Body Dressing: Total assistance, Bed level, +2 for safety/equipment, +2 for physical assistance Toilet Transfer Details (indicate cue type and reason): not attempted this session for pt/therapist safety Toileting- Clothing Manipulation and Hygiene: Total assistance, +2 for physical assistance, +2 for safety/equipment, Bed level General ADL Comments: Pt with decreased activity tolerance, noted cognitive deficits, and limited active ROM in B LE affecting functional level.   Cognition: Cognition Orientation Level: Oriented to person, Oriented to  place, Disoriented to situation, Disoriented to time Cognition Arousal: Alert Behavior During Therapy: Impulsive   Blood pressure 123/78, pulse 84, temperature 98.7 F (37.1 C), temperature source Oral, resp. rate 16, height 6\' 1"  (1.854 m), weight 100.3 kg, SpO2 97%. Physical Exam Constitutional:      General: He is not in acute distress. HENT:     Head: Normocephalic.     Right Ear: External ear normal.     Left Ear: External ear normal.     Nose: Nose normal.     Mouth/Throat:     Mouth: Mucous membranes are moist.  Eyes:     Pupils: Pupils are equal, round, and reactive to light.  Cardiovascular:     Rate and Rhythm: Normal rate.  Pulmonary:     Effort: Pulmonary effort is normal.  Abdominal:     Palpations: Abdomen is soft.  Musculoskeletal:        General: No swelling or tenderness.     Cervical back: Normal range of motion.  Skin:    General: Skin is warm.  Neurological:     Mental Status: He is alert.     Comments: Pt is alert and oriented to person, place, reason, month/year. Normal language and speech.   STM and attention deficits noted. Fair insight and awareness. UE motor 4/5 prox to distal. LE: HF 2- to 2/5, KE 3-/5, ADF/PF 0-tr/5. Sensory exam normal for light touch and pain in all 4 limbs. No limb ataxia or cerebellar signs. No abnormal tone appreciated.  DTR's trace. Pt had significant difficulty advancing either leg. Uses core and UE to assist movement of each leg. Significant genu recurvatum bilaterally in stance.   Psychiatric:        Mood and Affect: Mood normal.        Lab Results Last 24 Hours       Results for orders placed or performed during the hospital encounter of 08/26/23 (from the past 24 hours)  Glucose, capillary     Status: Abnormal    Collection Time: 08/29/23 11:33 AM  Result Value Ref Range    Glucose-Capillary 224 (H) 70 - 99 mg/dL  Glucose, capillary     Status: Abnormal    Collection Time: 08/29/23  5:36 PM  Result Value Ref Range     Glucose-Capillary 229 (H) 70 - 99 mg/dL  Glucose, capillary     Status: Abnormal    Collection Time: 08/29/23  8:38 PM  Result Value Ref Range    Glucose-Capillary 134 (H) 70 - 99 mg/dL  Glucose, capillary     Status: None  Collection Time: 08/29/23 11:37 PM  Result Value Ref Range    Glucose-Capillary 89 70 - 99 mg/dL  Glucose, capillary     Status: Abnormal    Collection Time: 08/30/23  4:30 AM  Result Value Ref Range    Glucose-Capillary 122 (H) 70 - 99 mg/dL  Cooxemetry Panel (carboxy, met, total hgb, O2 sat)     Status: Abnormal    Collection Time: 08/30/23  5:30 AM  Result Value Ref Range    Total hemoglobin 13.8 12.0 - 16.0 g/dL    O2 Saturation 40.9 %    Carboxyhemoglobin 1.7 (H) 0.5 - 1.5 %    Methemoglobin <0.7 0.0 - 1.5 %  CBC     Status: Abnormal    Collection Time: 08/30/23  5:49 AM  Result Value Ref Range    WBC 12.7 (H) 4.0 - 10.5 K/uL    RBC 4.68 4.22 - 5.81 MIL/uL    Hemoglobin 13.5 13.0 - 17.0 g/dL    HCT 81.1 91.4 - 78.2 %    MCV 84.8 80.0 - 100.0 fL    MCH 28.8 26.0 - 34.0 pg    MCHC 34.0 30.0 - 36.0 g/dL    RDW 95.6 21.3 - 08.6 %    Platelets 173 150 - 400 K/uL    nRBC 0.0 0.0 - 0.2 %  Basic metabolic panel with GFR     Status: Abnormal    Collection Time: 08/30/23  5:49 AM  Result Value Ref Range    Sodium 136 135 - 145 mmol/L    Potassium 4.1 3.5 - 5.1 mmol/L    Chloride 98 98 - 111 mmol/L    CO2 28 22 - 32 mmol/L    Glucose, Bld 149 (H) 70 - 99 mg/dL    BUN 16 6 - 20 mg/dL    Creatinine, Ser 5.78 0.61 - 1.24 mg/dL    Calcium 8.4 (L) 8.9 - 10.3 mg/dL    GFR, Estimated >46 >96 mL/min    Anion gap 10 5 - 15  Magnesium     Status: None    Collection Time: 08/30/23  5:49 AM  Result Value Ref Range    Magnesium 2.0 1.7 - 2.4 mg/dL  Glucose, capillary     Status: Abnormal    Collection Time: 08/30/23  8:07 AM  Result Value Ref Range    Glucose-Capillary 105 (H) 70 - 99 mg/dL    *Note: Due to a large number of results and/or encounters for  the requested time period, some results have not been displayed. A complete set of results can be found in Results Review.       Imaging Results (Last 48 hours)  MR CARDIAC MORPHOLOGY W WO CONTRAST Result Date: 08/29/2023 CLINICAL DATA:  Clinical question of VF arrest Study assumes BSA of 2.29 m2. EXAM: CARDIAC MRI TECHNIQUE: The patient was scanned on a 1.5 Tesla GE magnet. A dedicated cardiac coil was used. Functional imaging was done using Fiesta sequences. 2,3, and 4 chamber views were done to assess for RWMA's. Modified Simpson's rule using a short axis stack was used to calculate an ejection fraction on a dedicated work Research officer, trade union. The patient received 10 cc of Gadavist. After 10 minutes inversion recovery sequences were used to assess for infiltration and scar tissue. Flow quantification was performed 2 times during this examination with flow quantification performed at the levels of the ascending aorta above the valve, pulmonary artery above the valve. CONTRAST:  10 cc  of Gadavist FINDINGS: 1. Normal left ventricular size, with LVEDD 52 mm, and LVEDVi 67 mL/m2. Normal left ventricular thickness. Mild to moderate decrease in left ventricular systolic function (LVEF =40%). There are no regional wall motion abnormalities, but global hypokinesis. Normal resting perfusion. Left ventricular parametric mapping notable for normal T2 and ECV. There is no late gadolinium enhancement in the left ventricular myocardium. There is no evidence of LV thrombus. 2. Normal right ventricular size with RVEDVI 69 mL/m2. Normal right ventricular thickness. Mild decrease in right ventricular systolic function (RVEF =45%). There is septal and apical hypokinesis with preserved planar excursion. 3.  Normal left and right atrial size. 4. Normal size of the aortic root, ascending aorta and pulmonary artery. 5. Valve assessment: Aortic Valve: Tri-leaflet aortic valve. Qualitatively, there is no significant  regurgitation. Regurgitant fraction <1%. Pulmonic Valve: Qualitatively, there is no significant regurgitation. Regurgitant fraction < 1%. Tricuspid Valve: Qualitatively, there is no significant regurgitation. Regurgitant fraction 1%. Mitral Valve: Qualitatively, there is no significant regurgitation. Regurgitant fraction 1%. 6.  Normal pericardium.  No pericardial effusion. 7. Bilateral dependent ground glass opacities and consolidation. Recommended dedicated study if concerned for non-cardiac pathology. 8. Breath hold artifacts noted. Study was performed free breathing. This decreased the sensitivity of volumetric assessments. IMPRESSION: 1. Mild to moderate decrease in left ventricular systolic function (LVEF =40%). 2. There is no late gadolinium enhancement in the left ventricular myocardium. Normal parametric mapping and tissue characterization. 3. Mild decrease in right ventricular systolic function (RVEF =45%). Riley Lam MD Electronically Signed   By: Riley Lam M.D.   On: 08/29/2023 18:37    MR CARDIAC VELOCITY FLOW MAP Result Date: 08/29/2023 CLINICAL DATA:  Clinical question of VF arrest Study assumes BSA of 2.29 m2. EXAM: CARDIAC MRI TECHNIQUE: The patient was scanned on a 1.5 Tesla GE magnet. A dedicated cardiac coil was used. Functional imaging was done using Fiesta sequences. 2,3, and 4 chamber views were done to assess for RWMA's. Modified Simpson's rule using a short axis stack was used to calculate an ejection fraction on a dedicated work Research officer, trade union. The patient received 10 cc of Gadavist. After 10 minutes inversion recovery sequences were used to assess for infiltration and scar tissue. Flow quantification was performed 2 times during this examination with flow quantification performed at the levels of the ascending aorta above the valve, pulmonary artery above the valve. CONTRAST:  10 cc  of Gadavist FINDINGS: 1. Normal left ventricular size, with LVEDD  52 mm, and LVEDVi 67 mL/m2. Normal left ventricular thickness. Mild to moderate decrease in left ventricular systolic function (LVEF =40%). There are no regional wall motion abnormalities, but global hypokinesis. Normal resting perfusion. Left ventricular parametric mapping notable for normal T2 and ECV. There is no late gadolinium enhancement in the left ventricular myocardium. There is no evidence of LV thrombus. 2. Normal right ventricular size with RVEDVI 69 mL/m2. Normal right ventricular thickness. Mild decrease in right ventricular systolic function (RVEF =45%). There is septal and apical hypokinesis with preserved planar excursion. 3.  Normal left and right atrial size. 4. Normal size of the aortic root, ascending aorta and pulmonary artery. 5. Valve assessment: Aortic Valve: Tri-leaflet aortic valve. Qualitatively, there is no significant regurgitation. Regurgitant fraction <1%. Pulmonic Valve: Qualitatively, there is no significant regurgitation. Regurgitant fraction < 1%. Tricuspid Valve: Qualitatively, there is no significant regurgitation. Regurgitant fraction 1%. Mitral Valve: Qualitatively, there is no significant regurgitation. Regurgitant fraction 1%. 6.  Normal pericardium.  No pericardial  effusion. 7. Bilateral dependent ground glass opacities and consolidation. Recommended dedicated study if concerned for non-cardiac pathology. 8. Breath hold artifacts noted. Study was performed free breathing. This decreased the sensitivity of volumetric assessments. IMPRESSION: 1. Mild to moderate decrease in left ventricular systolic function (LVEF =40%). 2. There is no late gadolinium enhancement in the left ventricular myocardium. Normal parametric mapping and tissue characterization. 3. Mild decrease in right ventricular systolic function (RVEF =45%). Riley Lam MD Electronically Signed   By: Riley Lam M.D.   On: 08/29/2023 18:37    MR CARDIAC VELOCITY FLOW MAP Result Date:  08/29/2023 CLINICAL DATA:  Clinical question of VF arrest Study assumes BSA of 2.29 m2. EXAM: CARDIAC MRI TECHNIQUE: The patient was scanned on a 1.5 Tesla GE magnet. A dedicated cardiac coil was used. Functional imaging was done using Fiesta sequences. 2,3, and 4 chamber views were done to assess for RWMA's. Modified Simpson's rule using a short axis stack was used to calculate an ejection fraction on a dedicated work Research officer, trade union. The patient received 10 cc of Gadavist. After 10 minutes inversion recovery sequences were used to assess for infiltration and scar tissue. Flow quantification was performed 2 times during this examination with flow quantification performed at the levels of the ascending aorta above the valve, pulmonary artery above the valve. CONTRAST:  10 cc  of Gadavist FINDINGS: 1. Normal left ventricular size, with LVEDD 52 mm, and LVEDVi 67 mL/m2. Normal left ventricular thickness. Mild to moderate decrease in left ventricular systolic function (LVEF =40%). There are no regional wall motion abnormalities, but global hypokinesis. Normal resting perfusion. Left ventricular parametric mapping notable for normal T2 and ECV. There is no late gadolinium enhancement in the left ventricular myocardium. There is no evidence of LV thrombus. 2. Normal right ventricular size with RVEDVI 69 mL/m2. Normal right ventricular thickness. Mild decrease in right ventricular systolic function (RVEF =45%). There is septal and apical hypokinesis with preserved planar excursion. 3.  Normal left and right atrial size. 4. Normal size of the aortic root, ascending aorta and pulmonary artery. 5. Valve assessment: Aortic Valve: Tri-leaflet aortic valve. Qualitatively, there is no significant regurgitation. Regurgitant fraction <1%. Pulmonic Valve: Qualitatively, there is no significant regurgitation. Regurgitant fraction < 1%. Tricuspid Valve: Qualitatively, there is no significant regurgitation. Regurgitant  fraction 1%. Mitral Valve: Qualitatively, there is no significant regurgitation. Regurgitant fraction 1%. 6.  Normal pericardium.  No pericardial effusion. 7. Bilateral dependent ground glass opacities and consolidation. Recommended dedicated study if concerned for non-cardiac pathology. 8. Breath hold artifacts noted. Study was performed free breathing. This decreased the sensitivity of volumetric assessments. IMPRESSION: 1. Mild to moderate decrease in left ventricular systolic function (LVEF =40%). 2. There is no late gadolinium enhancement in the left ventricular myocardium. Normal parametric mapping and tissue characterization. 3. Mild decrease in right ventricular systolic function (RVEF =45%). Riley Lam MD Electronically Signed   By: Riley Lam M.D.   On: 08/29/2023 18:37       Assessment/Plan: Diagnosis: 53 year old male with a history of multiple sclerosis with further debility and mild hypoxic encephalopathy related to V-fib cardiac arrest/aspiration pneumonia Does the need for close, 24 hr/day medical supervision in concert with the patient's rehab needs make it unreasonable for this patient to be served in a less intensive setting? Yes Co-Morbidities requiring supervision/potential complications:  -Hypertension -Type 1 diabetes -Infectious disease considerations including treatment of pneumonia Due to bladder management, bowel management, safety, skin/wound care, disease management, medication administration,  pain management, and patient education, does the patient require 24 hr/day rehab nursing? Yes Does the patient require coordinated care of a physician, rehab nurse, therapy disciplines of PT, OT, ?SLP to address physical and functional deficits in the context of the above medical diagnosis(es)? Yes Addressing deficits in the following areas: balance, endurance, locomotion, strength, transferring, bowel/bladder control, bathing, dressing, feeding, grooming,  toileting, cognition, and psychosocial support Can the patient actively participate in an intensive therapy program of at least 3 hrs of therapy per day at least 5 days per week? Yes The potential for patient to make measurable gains while on inpatient rehab is excellent Anticipated functional outcomes upon discharge from inpatient rehab are supervision and min assist  with PT, supervision and min assist with OT, modified independent and supervision with SLP. Estimated rehab length of stay to reach the above functional goals is: 13-20 days Anticipated discharge destination: Home Overall Rehab/Functional Prognosis: excellent   POST ACUTE RECOMMENDATIONS: This patient's condition is appropriate for continued rehabilitative care in the following setting: CIR Patient has agreed to participate in recommended program. Yes Note that insurance prior authorization may be required for reimbursement for recommended care.   Comment: Pt is very motivated albeit a bit stubborn regarding his techniques and procedures. We looked at his current AFO's ([employer]-- which he doesn't regularly use) and they are not substantial enough to stabilize his ankles and knees. He has been using his upper extremities quite a bit as well PTA.  Upper extremity use will be somewhat limited, however, initially due to his ICD/PPM placement          I have personally performed a face to face diagnostic evaluation of this patient. Additionally, I have examined the patient's medical record including any pertinent labs and radiographic images.     Thanks,   Ranelle Oyster, MD 08/30/2023          Routing History

## 2023-09-03 NOTE — Care Management Important Message (Signed)
 Important Message  Patient Details  Name: Johnathan Ross MRN: [id number] Date of Birth: 1971-02-28   Important Message Given:  Yes - Medicare IM     [name] 09/03/2023, 10:28 AM

## 2023-09-03 NOTE — Plan of Care (Signed)
  Problem: Consults Goal: RH GENERAL PATIENT EDUCATION Description: See Patient Education module for education specifics. Outcome: Progressing   Problem: RH BOWEL ELIMINATION Goal: RH STG MANAGE BOWEL WITH ASSISTANCE Description: STG Manage Bowel with mod I Assistance. Outcome: Progressing Goal: RH STG MANAGE BOWEL W/MEDICATION W/ASSISTANCE Description: STG Manage Bowel with Medication with mod I Assistance. Outcome: Progressing   Problem: RH SAFETY Goal: RH STG ADHERE TO SAFETY PRECAUTIONS W/ASSISTANCE/DEVICE Description: STG Adhere to Safety Precautions With cues Assistance/Device. Outcome: Progressing   Problem: RH KNOWLEDGE DEFICIT GENERAL Goal: RH STG INCREASE KNOWLEDGE OF SELF CARE AFTER HOSPITALIZATION Description: Patient and family will be able to manage care at discharge using educational resources for medications and dietary modifications independently Outcome: Progressing   Problem: Education: Goal: Ability to describe self-care measures that may prevent or decrease complications (Diabetes Survival Skills Education) will improve Outcome: Progressing Goal: Individualized Educational Video(s) Outcome: Progressing   Problem: Coping: Goal: Ability to adjust to condition or change in health will improve Outcome: Progressing   Problem: Fluid Volume: Goal: Ability to maintain a balanced intake and output will improve Outcome: Progressing   Problem: Health Behavior/Discharge Planning: Goal: Ability to identify and utilize available resources and services will improve Outcome: Progressing Goal: Ability to manage health-related needs will improve Outcome: Progressing   Problem: Metabolic: Goal: Ability to maintain appropriate glucose levels will improve Outcome: Progressing   Problem: Nutritional: Goal: Maintenance of adequate nutrition will improve Outcome: Progressing Goal: Progress toward achieving an optimal weight will improve Outcome: Progressing    Problem: Skin Integrity: Goal: Risk for impaired skin integrity will decrease Outcome: Progressing   Problem: Tissue Perfusion: Goal: Adequacy of tissue perfusion will improve Outcome: Progressing

## 2023-09-03 NOTE — TOC Transition Note (Signed)
 Transition of Care [facility]) - Discharge Note   Patient Details  Name: Johnathan Ross MRN: [id number] Date of Birth: 04-Feb-1971  Transition of Care [facility] CM/SW Contact:  Gala Lewandowsky, RN Phone Number: 09/03/2023, 10:31 AM   Clinical Narrative: Inpatient Rehab Admissions Coordinator states patient has insurance approval for CIR. No further needs identified at this time.     Final next level of care: IP Rehab Facility Barriers to Discharge: No Barriers Identified  Discharge Plan and Services Additional resources added to the After Visit Summary for     Discharge Planning Services: CM Consult Post Acute Care Choice: IP Rehab           Social Drivers of Health (SDOH) Interventions SDOH Screenings   Food Insecurity: No Food Insecurity (08/28/2023)  Housing: Low Risk  (08/28/2023)  Transportation Needs: No Transportation Needs (08/28/2023)  Utilities: Not At Risk (08/28/2023)  Alcohol Screen: Low Risk  (09/07/2021)  Depression (PHQ2-9): Low Risk  (09/10/2022)  Financial Resource Strain: Low Risk  (09/10/2022)  Physical Activity: Sufficiently Active (09/10/2022)  Social Connections: Unknown (10/06/2021)   Received from [facility]  Recent Concern: Social Connections - Moderately Isolated (09/07/2021)  Stress: No Stress Concern Present (09/10/2022)  Tobacco Use: Low Risk  (08/29/2023)    Readmission Risk Interventions     No data to display
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
## 2023-09-03 NOTE — Progress Notes (Signed)
 Patient has insulin pump at home -->was on 35 units BID of basal insulin with resistant SSI till 04/05. BS were ranging 105- 416 range. Also getting Ensure TID between meals. He started dropping 04/05 therefore pm basal d/c and was changed to sensitive scale with BS now ranging 90-374--tends to drop at bedtime from 291 to 63 with 5 units novolog. Will likely need basal insulin twice a day for more consistent coverage as BS 334 at lunch and 414 before supper.

## 2023-09-03 NOTE — Progress Notes (Signed)
  Inpatient Rehabilitation Admissions Coordinator   I have insurance approval and CIR bed to admit to today. I spoke with patient and Mom at bedside. Spoke with sister by phone. We are not placing insulin pump today due to his cognition. I have made acute team and TOC aware. He will need his long acting insulin today. I will make the arrangements.  Ottie Glazier, RN, MSN Rehab Admissions Coordinator [phone] 09/03/2023 10:32 AM

## 2023-09-03 NOTE — Discharge Summary (Signed)
 Physician Discharge Summary  Patient ID: Johnathan Ross MRN: [id number] DOB/AGE: [birth year] 51 y.o.  Admit date: 09/03/2023 Discharge date: 09/11/2023  Discharge Diagnoses:  Principal Problem:   Acute encephalopathy Cardiac arrest/V-fib/V. Tach DVT prophylaxis Acute biventricular systolic heart failure with right ventricular dominant shock Status post ICD Diabetes mellitus with proliferative diabetic retinopathy Hypothyroidism History of multiple sclerosis AKI on CKD stage III Hyperlipidemia   Discharged Condition: Stable  Significant Diagnostic Studies: DG CHEST PORT 1 VIEW Result Date: 09/01/2023 CLINICAL DATA:  Shortness of breath. EXAM: PORTABLE CHEST 1 VIEW COMPARISON:  August 31, 2023. FINDINGS: The heart size and mediastinal contours are within normal limits. Left-sided defibrillator is unchanged. Mild central pulmonary vascular congestion may be present. No consolidative process is noted. The visualized skeletal structures are unremarkable. IMPRESSION: Possible mild central pulmonary vascular congestion. Electronically Signed   By: Rosalene Colon M.D.   On: 09/01/2023 08:16   DG Chest 2 View Result Date: 08/31/2023 CLINICAL DATA:  AICD placement. EXAM: CHEST - 2 VIEW COMPARISON:  08/27/2023 FINDINGS: Heart size is normal. Single lead AICD is seen with tip overlying the right ventricle. No pneumothorax visualized. Decreased interstitial edema seen since previous study. No No evidence of focal consolidation or pleural effusion. IMPRESSION: Single lead AICD in expected position.  No pneumothorax visualized. Decreased pulmonary interstitial edema. Electronically Signed   By: Marlyce Sine M.D.   On: 08/31/2023 10:26   EP PPM/ICD IMPLANT Result Date: 08/30/2023  SURGEON:  Agatha Horsfall, MD    PREPROCEDURE DIAGNOSES:  1. Cardiac arrest due to VT/VF    POSTPROCEDURE DIAGNOSES:  1. Cardiac arrest due to VT/VF    PROCEDURES:   1. ICD implantation.  2. Left upper extremity venography     INTRODUCTION: Johnathan Ross is a 53 y.o. male who presented to the hospital with cardiac arrest due to ventricular tachycardia/ventricular fibrillation.  Cardiac MRI shows an ejection fraction of 40% and no scar.  The patient therefore  presents today for ICD implantation.    DESCRIPTION OF PROCEDURE:  Informed written consent was obtained and the patient was brought to the electrophysiology lab in the fasting state. The patient was adequately sedated with intravenous Versed, and fentanyl as outlined in the nursing report.  The patient's left chest was prepped and draped in the usual sterile fashion by the EP lab staff.  The skin overlying the left deltopectoral region was infiltrated with lidocaine for local analgesia.  A 5-cm incision was made over the left deltopectoral region.  A left subcutaneous defibrillator pocket was fashioned using a combination of sharp and blunt dissection.  Electrocautery was used to assure hemostasis. Left Upper extremity Venography:  A venogram of the left upper extremity was performed which revealed a moderate sized left axillary vein which emptied into a moderate sized left subclavian vein.  The axillary vein was subtotally occluded at the level of the first rib with collateral circulation around this area. RA/RV Lead Placement: The left axillary vein was cannulated with fluoroscopic visualization.  Using multiple sheaths and dilators, a 7 French peel-away sheath was advanced across the obstruction and into the superior vena cava.  Through the left axillary vein, a [employer] [place], model 9562Z-30 (serial number [id number]) right ventricular defibrillator lead was advanced with fluoroscopic visualization into the right ventricular apex.  The right ventricular lead R-wave measured 5 mV with impedance of 701 ohms and a threshold of 1 volts at 0.5 milliseconds. The leads were secured to the pectoralis  fascia using #  2 silk suture over the suture sleeves.  The pocket then   irrigated with copious gentamicin solution.  The leads were then  connected to an [employer] Gallant[employer] (serial  Number [id number]) ICD.  The defibrillator was placed into the  pocket.  The pocket was then closed in 3 layers with 2.0 Vicryl suture  for the subcutaneous and 3.0 Vicryl suture subcuticular layers.  EBL<60ml. Steri-Strips and a  sterile dressing were then applied.    CONCLUSIONS:  1. Cardiac arrest due to VT/VF  2. Successful ICD implantation.  3. No early apparent complications.   MR CARDIAC MORPHOLOGY W WO CONTRAST Result Date: 08/29/2023 CLINICAL DATA:  Clinical question of VF arrest Study assumes BSA of 2.29 m2. EXAM: CARDIAC MRI TECHNIQUE: The patient was scanned on a 1.5 Tesla GE magnet. A dedicated cardiac coil was used. Functional imaging was done using Fiesta sequences. 2,3, and 4 chamber views were done to assess for RWMA's. Modified Simpson's rule using a short axis stack was used to calculate an ejection fraction on a dedicated work [employer]. The patient received 10 cc of Gadavist. After 10 minutes inversion recovery sequences were used to assess for infiltration and scar tissue. Flow quantification was performed 2 times during this examination with flow quantification performed at the levels of the ascending aorta above the valve, pulmonary artery above the valve. CONTRAST:  10 cc  of Gadavist FINDINGS: 1. Normal left ventricular size, with LVEDD 52 mm, and LVEDVi 67 mL/m2. Normal left ventricular thickness. Mild to moderate decrease in left ventricular systolic function (LVEF =40%). There are no regional wall motion abnormalities, but global hypokinesis. Normal resting perfusion. Left ventricular parametric mapping notable for normal T2 and ECV. There is no late gadolinium enhancement in the left ventricular myocardium. There is no evidence of LV thrombus. 2. Normal right ventricular size with RVEDVI 69 mL/m2. Normal right ventricular thickness. Mild decrease in  right ventricular systolic function (RVEF =45%). There is septal and apical hypokinesis with preserved planar excursion. 3.  Normal left and right atrial size. 4. Normal size of the aortic root, ascending aorta and pulmonary artery. 5. Valve assessment: Aortic Valve: Tri-leaflet aortic valve. Qualitatively, there is no significant regurgitation. Regurgitant fraction <1%. Pulmonic Valve: Qualitatively, there is no significant regurgitation. Regurgitant fraction < 1%. Tricuspid Valve: Qualitatively, there is no significant regurgitation. Regurgitant fraction 1%. Mitral Valve: Qualitatively, there is no significant regurgitation. Regurgitant fraction 1%. 6.  Normal pericardium.  No pericardial effusion. 7. Bilateral dependent ground glass opacities and consolidation. Recommended dedicated study if concerned for non-cardiac pathology. 8. Breath hold artifacts noted. Study was performed free breathing. This decreased the sensitivity of volumetric assessments. IMPRESSION: 1. Mild to moderate decrease in left ventricular systolic function (LVEF =40%). 2. There is no late gadolinium enhancement in the left ventricular myocardium. Normal parametric mapping and tissue characterization. 3. Mild decrease in right ventricular systolic function (RVEF =45%). Gloriann Larger MD Electronically Signed   By: Gloriann Larger M.D.   On: 08/29/2023 18:37   MR CARDIAC VELOCITY FLOW MAP Result Date: 08/29/2023 CLINICAL DATA:  Clinical question of VF arrest Study assumes BSA of 2.29 m2. EXAM: CARDIAC MRI TECHNIQUE: The patient was scanned on a 1.5 Tesla GE magnet. A dedicated cardiac coil was used. Functional imaging was done using Fiesta sequences. 2,3, and 4 chamber views were done to assess for RWMA's. Modified Simpson's rule using a short axis stack was used to calculate an ejection fraction on a dedicated work station using  [employer] software. The patient received 10 cc of Gadavist. After 10 minutes inversion recovery  sequences were used to assess for infiltration and scar tissue. Flow quantification was performed 2 times during this examination with flow quantification performed at the levels of the ascending aorta above the valve, pulmonary artery above the valve. CONTRAST:  10 cc  of Gadavist FINDINGS: 1. Normal left ventricular size, with LVEDD 52 mm, and LVEDVi 67 mL/m2. Normal left ventricular thickness. Mild to moderate decrease in left ventricular systolic function (LVEF =40%). There are no regional wall motion abnormalities, but global hypokinesis. Normal resting perfusion. Left ventricular parametric mapping notable for normal T2 and ECV. There is no late gadolinium enhancement in the left ventricular myocardium. There is no evidence of LV thrombus. 2. Normal right ventricular size with RVEDVI 69 mL/m2. Normal right ventricular thickness. Mild decrease in right ventricular systolic function (RVEF =45%). There is septal and apical hypokinesis with preserved planar excursion. 3.  Normal left and right atrial size. 4. Normal size of the aortic root, ascending aorta and pulmonary artery. 5. Valve assessment: Aortic Valve: Tri-leaflet aortic valve. Qualitatively, there is no significant regurgitation. Regurgitant fraction <1%. Pulmonic Valve: Qualitatively, there is no significant regurgitation. Regurgitant fraction < 1%. Tricuspid Valve: Qualitatively, there is no significant regurgitation. Regurgitant fraction 1%. Mitral Valve: Qualitatively, there is no significant regurgitation. Regurgitant fraction 1%. 6.  Normal pericardium.  No pericardial effusion. 7. Bilateral dependent ground glass opacities and consolidation. Recommended dedicated study if concerned for non-cardiac pathology. 8. Breath hold artifacts noted. Study was performed free breathing. This decreased the sensitivity of volumetric assessments. IMPRESSION: 1. Mild to moderate decrease in left ventricular systolic function (LVEF =40%). 2. There is no late  gadolinium enhancement in the left ventricular myocardium. Normal parametric mapping and tissue characterization. 3. Mild decrease in right ventricular systolic function (RVEF =45%). Gloriann Larger MD Electronically Signed   By: Gloriann Larger M.D.   On: 08/29/2023 18:37   MR CARDIAC VELOCITY FLOW MAP Result Date: 08/29/2023 CLINICAL DATA:  Clinical question of VF arrest Study assumes BSA of 2.29 m2. EXAM: CARDIAC MRI TECHNIQUE: The patient was scanned on a 1.5 Tesla GE magnet. A dedicated cardiac coil was used. Functional imaging was done using Fiesta sequences. 2,3, and 4 chamber views were done to assess for RWMA's. Modified Simpson's rule using a short axis stack was used to calculate an ejection fraction on a dedicated work [employer], [employer]. The patient received 10 cc of Gadavist. After 10 minutes inversion recovery sequences were used to assess for infiltration and scar tissue. Flow quantification was performed 2 times during this examination with flow quantification performed at the levels of the ascending aorta above the valve, pulmonary artery above the valve. CONTRAST:  10 cc  of Gadavist FINDINGS: 1. Normal left ventricular size, with LVEDD 52 mm, and LVEDVi 67 mL/m2. Normal left ventricular thickness. Mild to moderate decrease in left ventricular systolic function (LVEF =40%). There are no regional wall motion abnormalities, but global hypokinesis. Normal resting perfusion. Left ventricular parametric mapping notable for normal T2 and ECV. There is no late gadolinium enhancement in the left ventricular myocardium. There is no evidence of LV thrombus. 2. Normal right ventricular size with RVEDVI 69 mL/m2. Normal right ventricular thickness. Mild decrease in right ventricular systolic function (RVEF =45%). There is septal and apical hypokinesis with preserved planar excursion. 3.  Normal left and right atrial size. 4. Normal size of the aortic root, ascending aorta and  pulmonary artery. 5.  Valve assessment: Aortic Valve: Tri-leaflet aortic valve. Qualitatively, there is no significant regurgitation. Regurgitant fraction <1%. Pulmonic Valve: Qualitatively, there is no significant regurgitation. Regurgitant fraction < 1%. Tricuspid Valve: Qualitatively, there is no significant regurgitation. Regurgitant fraction 1%. Mitral Valve: Qualitatively, there is no significant regurgitation. Regurgitant fraction 1%. 6.  Normal pericardium.  No pericardial effusion. 7. Bilateral dependent ground glass opacities and consolidation. Recommended dedicated study if concerned for non-cardiac pathology. 8. Breath hold artifacts noted. Study was performed free breathing. This decreased the sensitivity of volumetric assessments. IMPRESSION: 1. Mild to moderate decrease in left ventricular systolic function (LVEF =40%). 2. There is no late gadolinium enhancement in the left ventricular myocardium. Normal parametric mapping and tissue characterization. 3. Mild decrease in right ventricular systolic function (RVEF =45%). Gloriann Larger MD Electronically Signed   By: Gloriann Larger M.D.   On: 08/29/2023 18:37   DG CHEST PORT 1 VIEW Addendum Date: 08/27/2023 ADDENDUM REPORT: 08/27/2023 10:19 ADDENDUM: Critical Value/emergent results were called by telephone at the time of interpretation on 08/27/2023 at 10:18 am to nurse Alissa, who verbally acknowledged these results and will contact the caring service urgently. Electronically Signed   By: Adrianna Horde M.D.   On: 08/27/2023 10:19   Result Date: 08/27/2023 CLINICAL DATA:  Respiratory failure EXAM: PORTABLE CHEST 1 VIEW COMPARISON:  Chest x-ray 08/26/2023 and older FINDINGS: Stable enteric tube and right IJ line. New left IJ line with tip at the right hilum, Swan-Ganz catheter. ET tube in place but is somewhat high, above the clavicles and approximately 11 cm above the carina. Recommend this be advanced 5 cm and repeat x-ray. Increasing  vascular congestion. Normal cardiopericardial silhouette. No pneumothorax or effusion. Overlapping cardiac leads. Critical Value/emergent results were called by telephone at the time of interpretation on 08/27/2023 at 10:13 am to provider [facility] ELLISON , who verbally acknowledged these results. IMPRESSION: High position of the ET tube with tip well above the clavicles. Recommend advancement by 5 cm and repeat x-ray. Increasing vascular congestion. New left IJ Swan-Ganz catheter with tip at the right lung hilum. No pneumothorax Electronically Signed: By: Adrianna Horde M.D. On: 08/27/2023 10:13   CARDIAC CATHETERIZATION Result Date: 08/26/2023   Ost LAD to Prox LAD lesion is 30% stenosed. HEMODYNAMICS:. RA:   12 mmHg (mean) RV:   35/10-14 mmHg PA:   38/20 mmHg (26 mean) PCWP:  14 mmHg (mean)    Estimated Fick CO/CI: 4.3 L/min, 1.94 L/min/m2  Thermodilution CO/CI: 4.39 L/min, 1.98 L/min/m2    TPG    12  mmHg     PVR     2.8 Wood Units PAPi      1.5  LVEDP: 18 IMPRESSION: Severely elevated pre capillary filling pressures Hemodynamics (PAPi, CVP/PCWP ratio) consistent with suboptimal RV function. Mild nonobstructive coronary artery disease.   ECHOCARDIOGRAM COMPLETE Result Date: 08/26/2023    ECHOCARDIOGRAM REPORT   Patient Name:   Johnathan Ross Date of Exam: 08/26/2023 Medical Rec #:  [id number]        Height:       73.0 in Accession #:    [id number]       Weight:       216.5 lb Date of Birth:  Aug 30, 1970        BSA:          2.225 m Patient Age:    53 years         BP:           110/65 mmHg  Patient Gender: M                HR:           79 bpm. Exam Location:  Inpatient Procedure: 2D Echo, Color Doppler, Cardiac Doppler and Intracardiac            Opacification Agent (Both Spectral and Color Flow Doppler were            utilized during procedure). Indications:    Cardiac Arrest I46.9  History:        Patient has no prior history of Echocardiogram examinations.                 Risk Factors:Diabetes and Dyslipidemia.   Sonographer:    Hersey Lorenzo RDCS Referring Phys: [phone] LAURA R GLEASON IMPRESSIONS  1. No left ventricular thrombus is seen (Definity contrast used). Left ventricular ejection fraction, by estimation, is 30 to 35%. The left ventricle has moderately decreased function. The left ventricle demonstrates global hypokinesis. Left ventricular  diastolic parameters are indeterminate.  2. Right ventricular systolic function is mildly reduced. The right ventricular size is normal.  3. The mitral valve is normal in structure. No evidence of mitral valve regurgitation. No evidence of mitral stenosis.  4. The aortic valve is tricuspid. Aortic valve regurgitation is not visualized. No aortic stenosis is present. FINDINGS  Left Ventricle: No left ventricular thrombus is seen (Definity contrast used). Left ventricular ejection fraction, by estimation, is 30 to 35%. The left ventricle has moderately decreased function. The left ventricle demonstrates global hypokinesis. The  left ventricular internal cavity size was normal in size. There is no left ventricular hypertrophy. Left ventricular diastolic parameters are indeterminate. Right Ventricle: The right ventricular size is normal. No increase in right ventricular wall thickness. Right ventricular systolic function is mildly reduced. Left Atrium: Left atrial size was normal in size. Right Atrium: Right atrial size was normal in size. Pericardium: There is no evidence of pericardial effusion. Mitral Valve: The mitral valve is normal in structure. No evidence of mitral valve regurgitation. No evidence of mitral valve stenosis. Tricuspid Valve: The tricuspid valve is normal in structure. Tricuspid valve regurgitation is not demonstrated. Aortic Valve: The aortic valve is tricuspid. Aortic valve regurgitation is not visualized. No aortic stenosis is present. Pulmonic Valve: The pulmonic valve was normal in structure. Pulmonic valve regurgitation is not visualized. No evidence of  pulmonic stenosis. Aorta: The aortic root and ascending aorta are structurally normal, with no evidence of dilitation. IAS/Shunts: No atrial level shunt detected by color flow Doppler.  LEFT VENTRICLE PLAX 2D LVIDd:         5.20 cm      Diastology LVIDs:         4.80 cm      LV e' medial:    8.38 cm/s LV PW:         0.90 cm      LV E/e' medial:  6.6 LV IVS:        0.90 cm      LV e' lateral:   8.05 cm/s LVOT diam:     1.90 cm      LV E/e' lateral: 6.8 LV SV:         37 LV SV Index:   16 LVOT Area:     2.84 cm  LV Volumes (MOD) LV vol d, MOD A2C: 97.5 ml LV vol d, MOD A4C: 152.0 ml LV vol s, MOD A2C: 72.2 ml LV vol s,  MOD A4C: 113.0 ml LV SV MOD A2C:     25.3 ml LV SV MOD A4C:     152.0 ml LV SV MOD BP:      27.2 ml RIGHT VENTRICLE             IVC RV S prime:     10.00 cm/s  IVC diam: 1.90 cm TAPSE (M-mode): 1.5 cm LEFT ATRIUM             Index        RIGHT ATRIUM          Index LA diam:        2.90 cm 1.30 cm/m   RA Area:     8.44 cm LA Vol (A2C):   48.8 ml 21.93 ml/m  RA Volume:   17.40 ml 7.82 ml/m LA Vol (A4C):   24.1 ml 10.83 ml/m LA Biplane Vol: 34.8 ml 15.64 ml/m  AORTIC VALVE LVOT Vmax:   84.60 cm/s LVOT Vmean:  62.000 cm/s LVOT VTI:    0.129 m  AORTA Ao Root diam: 2.90 cm Ao Asc diam:  2.80 cm MITRAL VALVE MV Area (PHT): 5.16 cm    SHUNTS MV Decel Time: 147 msec    Systemic VTI:  0.13 m MV E velocity: 55.00 cm/s  Systemic Diam: 1.90 cm MV A velocity: 62.90 cm/s MV E/A ratio:  0.87 Mihai Croitoru MD Electronically signed by Thurmon Fair MD Signature Date/Time: 08/26/2023/1:20:18 PM    Final    US RENAL Result Date: 08/26/2023 CLINICAL DATA:  Acute kidney injury. EXAM: RENAL / URINARY TRACT ULTRASOUND COMPLETE COMPARISON:  None. FINDINGS: Right Kidney: Renal measurements: 9.7 x 3.7 x 5.1 cm = volume: 96 mL. Increased echogenicity. No hydronephrosis. Left Kidney: Renal measurements: 9.8 x 4.5 x 4.7 cm = volume: 108 mL. Increased echogenicity. No hydronephrosis. Bladder: Decompressed by Foley  catheter. Other: None. IMPRESSION: No hydronephrosis. Increased renal parenchymal echogenicity suggests medical renal disease. Electronically Signed   By: Kennith Center M.D.   On: 08/26/2023 11:54   EEG adult Result Date: 08/26/2023 Charlsie Quest, MD     08/26/2023 10:37 AM Patient Name: Johnathan Ross MRN: [id number] Epilepsy Attending: Charlsie Quest Referring Physician/Provider: Gleason, Darcella Gasman, PA-C Date: 08/26/2023 Duration: 22.34 mins Patient history: 53 year old male status post cardiac arrest.  EEG evaluate for seizure. Level of alertness:  comatose AEDs during EEG study: Propofol Technical aspects: This EEG study was done with scalp electrodes positioned according to the 10-20 International system of electrode placement. Electrical activity was reviewed with band pass filter of 1-70Hz , sensitivity of 7 uV/mm, display speed of 37mm/sec with a 60Hz  notched filter applied as appropriate. EEG data were recorded continuously and digitally stored.  Video monitoring was available and reviewed as appropriate. Description: EEG showed continuous generalized 3 to 6 Hz theta-delta slowing. Hyperventilation and photic stimulation were not performed.   ABNORMALITY - Continuous slow, generalized IMPRESSION: This study is suggestive of moderate to severe diffuse encephalopathy. No seizures or epileptiform discharges were seen throughout the recording. Charlsie Quest   DG CHEST PORT 1 VIEW Result Date: 08/26/2023 CLINICAL DATA:  Central line placement. EXAM: PORTABLE CHEST 1 VIEW COMPARISON:  Earlier same day FINDINGS: Endotracheal tube tip is 6.4 cm above the base of the carina. The NG tube passes into the stomach although the distal tip position is not included on the film. Right IJ central line is new in the interval with tip overlying the low SVC level near the RA  junction. The central right greater than left airspace disease is similar to prior. No substantial pleural effusion although neither  costophrenic angle has been included on the film. Telemetry leads overlie the chest. IMPRESSION: 1. Right IJ central line tip overlies the low SVC level near the RA junction. No pneumothorax. 2. No other significant interval change. Electronically Signed   By: Donnal Fusi M.D.   On: 08/26/2023 05:02   CT HEAD WO CONTRAST ( ) Result Date: 08/26/2023 CLINICAL DATA:  Delirium EXAM: CT HEAD WITHOUT CONTRAST TECHNIQUE: Contiguous axial images were obtained from the base of the skull through the vertex without intravenous contrast. RADIATION DOSE REDUCTION: This exam was performed according to the departmental dose-optimization program which includes automated exposure control, adjustment of the mA and/or kV according to patient size and/or use of iterative reconstruction technique. COMPARISON:  MRI head August 20, 2017. FINDINGS: Brain: No evidence of acute infarction, hemorrhage, hydrocephalus, extra-axial collection or mass lesion/mass effect. White matter lesions better characterized on prior MRI. Vascular: No hyperdense vessel. Skull: No acute fracture. Sinuses/Orbits: Mild paranasal sinus mucosal thickening. No acute orbital findings. Other: No mastoid effusions. IMPRESSION: 1. No evidence of acute intracranial abnormality. 2. White matter lesions better characterized on prior MRI. Electronically Signed   By: Stevenson Elbe M.D.   On: 08/26/2023 02:27   CT Angio Chest Pulmonary Embolism (PE) W or WO Contrast Result Date: 08/26/2023 CLINICAL DATA:  High probability pulmonary embolism, cardiopulmonary arrest EXAM: CT ANGIOGRAPHY CHEST WITH CONTRAST TECHNIQUE: Multidetector CT imaging of the chest was performed using the standard protocol during bolus administration of intravenous contrast. Multiplanar CT image reconstructions and MIPs were obtained to evaluate the vascular anatomy. RADIATION DOSE REDUCTION: This exam was performed according to the departmental dose-optimization program which includes  automated exposure control, adjustment of the mA and/or kV according to patient size and/or use of iterative reconstruction technique. CONTRAST:  75mL OMNIPAQUE IOHEXOL 350 MG/ML SOLN COMPARISON:  None Available. FINDINGS: Cardiovascular: Adequate opacification of the pulmonary arterial tree. No intraluminal filling defect identified to suggest acute pulmonary embolism. Central pulmonary arteries are of normal caliber. Moderate left anterior descending coronary artery calcification. Cardiac size within normal limits. No pericardial effusion. No significant atherosclerotic calcification within the thoracic aorta. No aortic aneurysm. Mediastinum/Nodes: Endotracheal tube and nasogastric tube are in appropriate position. The thyroid gland is atrophic or absent. No definite pathologic adenopathy though imaging is limited by phase of contrast enhancement. The esophagus is unremarkable. Lungs/Pleura: There is extensive dependent pulmonary consolidation throughout the lungs which may relate to aspiration or multifocal infection. No pneumothorax or pleural effusion. Central airways are widely patent. Upper Abdomen: No acute abnormality. Musculoskeletal: No chest wall abnormality. No acute or significant osseous findings. Review of the MIP images confirms the above findings. IMPRESSION: 1. No pulmonary embolism. 2. Moderate left anterior descending coronary artery calcification. 3. Extensive dependent pulmonary consolidation throughout the lungs which may relate to aspiration or multifocal infection. Electronically Signed   By: Worthy Heads M.D.   On: 08/26/2023 02:14   DG Chest Port 1 View Result Date: 08/26/2023 CLINICAL DATA:  Cardiac arrest, post CPR.  ET tube placement EXAM: PORTABLE CHEST 1 VIEW COMPARISON:  08/25/2023 FINDINGS: Endotracheal tube is 5 cm above the carina. OG tube enters the stomach. Heart and mediastinal contours are within normal limits. Perihilar airspace opacities may reflect edema. No effusions  or pneumothorax. No acute bony abnormality. IMPRESSION: Support devices in expected position as above. Bilateral perihilar opacities may reflect edema. Electronically Signed  By: Janeece Mechanic M.D.   On: 08/26/2023 01:47   DG Abd Portable 1V Result Date: 08/26/2023 CLINICAL DATA:  OG tube placement EXAM: PORTABLE ABDOMEN - 1 VIEW COMPARISON:  None Available. FINDINGS: OG tube tip in the stomach.  Nonobstructive bowel gas pattern. IMPRESSION: OG tube in the stomach. Electronically Signed   By: Janeece Mechanic M.D.   On: 08/26/2023 01:46   DG Chest Port 1 View Result Date: 08/25/2023 CLINICAL DATA:  Shortness of breath EXAM: PORTABLE CHEST 1 VIEW COMPARISON:  March 02, 2022 FINDINGS: Evaluation is limited by positioning. The cardiomediastinal silhouette is unchanged in contour. No pleural effusion. No pneumothorax. No acute pleuroparenchymal abnormality. IMPRESSION: No acute cardiopulmonary abnormality. Electronically Signed   By: Clancy Crimes M.D.   On: 08/25/2023 14:10    Labs:  Basic Metabolic Panel: Recent Labs  Lab 08/28/23 0104 08/29/23 0544 08/30/23 0549 08/31/23 0503 09/01/23 0408 09/02/23 0520  NA 134* 134* 136 136 139 137  K 4.1 4.3 4.1 4.4 3.9 4.1  CL 102 97* 98 101 102 99  CO2 22 26 28 27 28 24   GLUCOSE 252* 143* 149* 236* 52* 114*  BUN 26* 18 16 26* 23* 19  CREATININE 1.33* 0.93 1.00 1.32* 1.21 1.03  CALCIUM 8.2* 8.2* 8.4* 8.8* 8.6* 8.7*  MG 2.2 1.9 2.0 2.1 1.9 1.8  PHOS  --   --   --  3.3 4.5 3.8    CBC: Recent Labs  Lab 08/31/23 0503 09/01/23 0408 09/02/23 0520  WBC 17.6* 22.1* 12.6*  NEUTROABS 15.9* 17.8* 8.7*  HGB 13.7 13.4 14.7  HCT 40.0 40.1 44.7  MCV 83.9 85.9 87.6  PLT 219 256 249    CBG: Recent Labs  Lab 09/03/23 0003 09/03/23 0407 09/03/23 0740 09/03/23 1157 09/03/23 1706  GLUCAP 220* 149* 152* 334* 414*   Family history.  Sister with diabetes.  Cousin with stomach cancer.  Negative for esophageal cancer or rectal cancer  Brief HPI:    ANTWAINE BOOMHOWER is a 53 y.o. right-handed male with history significant for hyperlipidemia, hypertension, hypothyroidism diabetes mellitus with proliferative diabetic retinopathy and has an insulin pump, multiple sclerosis followed by neurology services Dr. Godwin Lat maintained on dalfampridine.  Per chart review lives with girlfriend.  1 level home 4 steps to entry.  Girlfriend works outside the home.  Modified independent mobility.  Presented 08/26/2023 after collapsing in the community and receiving CPR before he was found by EMS.  Patient was found to have ventricular fibrillation.  He went through 20 minutes of ACLS protocol was intubated and brought to the emergency room found to be in sinus rhythm with prolonged QTc.  CT of the head and CTA of the chest did not reveal any acute abnormalities although there was some concern of aspiration in the lungs.  He remained intubated 08/26/2023 - 08/27/2023.  Echocardiogram showed ejection fraction of 30 to 35%.  The left ventricle demonstrating global hypokinesis.  EEG suggestive of moderate to severe diffuse encephalopathy no seizure.  Admission chemistries unremarkable except glucose 150 troponin 290-150, urine drug screen positive benzos.  Postarrest EKG normal sinus rhythm with low voltage and short P-RR interval.  No STE.  Cardiac MRI done showed no evidence of LGE but did show some mild to moderate decrease in left ventricular systolic function of 40% and a mild decrease in right ventricular systolic function.  Underwent cardiac catheterization showing mild nonobstructive CAD severely elevated precapillary filling pressures.  Hemodynamically consistent with suboptimal RV function.  He was  started on milrinone.  Hospital course followed closely by cardiology services underwent ICD/PPM 08/30/2023.  Elevated leukocytosis 27,100 as well as suggestion of possible aspiration placed on Rocephin per CCM.  It was felt to possibly elevated leukocytosis with due to steroid  demargination received during pacer placement.  He was cleared to begin subcutaneous heparin for DVT prophylaxis.  AKI on CKD stage III latest creatinine 1.21.  Abnormal LFTs in the setting of shock and cardiac arrest and monitored.  He was tolerating a regular diet.  Therapy evaluations completed due to patient decreased functional mobility was admitted for a comprehensive rehab program.   Hospital Course: Johnathan Ross was admitted to rehab 09/03/2023 for inpatient therapies to consist of PT, ST and OT at least three hours five days a week. Past admission physiatrist, therapy team and rehab RN have worked together to provide customized collaborative inpatient rehab.  Pertaining to patient's cardiac arrest/V-fib/V. tach with anoxic encephalopathy remained stable.  Cranial CT scan negative.  Followed by neurology services as well as cardiology services.  Subcutaneous heparin for DVT prophylaxis no bleeding episodes.  Acute biventricular systolic heart failure with right ventricular dominant shock status post ICD 08/30/2023 maintained on amiodarone as well as Toprol adjusted to 12.5 mg daily exhibiting no signs of fluid overload.  He was started on low-dose aspirin.  His Entresto and Aldactone were on hold due to CKD follow-up per cardiology services. Blood sugars maintained hemoglobin A1c 7.3 insulin therapy as directed patient did have an insulin pump and was cleared to resume use of insulin pump on discharge as his cognition had returned to baseline.  Synthroid ongoing for hypothyroidism.  He did have a history of multiple sclerosis followed by neurology services continued on Dalfampridine 10 mg twice daily as well as Requip for restless legs.  AKI on CKD stage III chemistries stable with latest creatinine 1.60.   Blood pressures were monitored on TID basis and remained controlled and monitored  Diabetes has been monitored with ac/hs CBG checks and SSI was use prn for tighter BS control.    Rehab course:  During patient's stay in rehab weekly team conferences were held to monitor patient's progress, set goals and discuss barriers to discharge. At admission, patient required moderate assist 55 feet with Marlea Silvers walker moderate assist sit to stand  Physical exam.  Blood pressure 102/70 pulse 71 temperature 98 respiration 16 oxygen saturation 97% room air Constitutional.  No acute distress HEENT Head.  Normocephalic and atraumatic Eyes.  Pupils round and reactive to light no discharge without nystagmus Neck.  Supple nontender no JVD without thyromegaly Cardiac regular rate and rhythm without any extra sounds or murmur heard Abdomen.  Soft nontender positive bowel sounds without rebound Respiratory effort normal no respiratory distress without wheeze Skin.  PPM site clean dry and intact Neurologic.  Patient alert oriented to name and age month and year.  Demonstrated ongoing short-term deficits.  Impaired to fair insight and awareness.  Cranial nerve exam nonfocal.  Manual muscle testing bilateral upper extremities 4/5 proximal to distal bilateral lower extremity 2/5 HF 3 - to 3/5 KE and 0 to trace ADF PF.  Sensation intact  He/She  has had improvement in activity tolerance, balance, postural control as well as ability to compensate for deficits. He/She has had improvement in functional use RUE/LUE  and RLE/LLE as well as improvement in awareness.  Patient perform sit to stand rolling walker contact-guard verbal cues.  Ambulates to the bathroom rolling walker contact-guard.  Perform ambulatory  transfer to wheelchair with contact-guard and verbal cues.  Ambulates to the main gym rolling walker contact-guard.  Navigates stairs bilateral handrails contact-guard to minimal assist.  He is able to transfer in and out of the tub with supervision.  In the kitchen demonstrated ability to reach into various  heights (close refrigator door all contact-guard assist level.  SLP follow-up for cognition discussed with family  patient much improved and back to baseline level of function.  Full family teaching completed plan discharge to home       Disposition:  There are no questions and answers to display.         Diet: Regular  Special Instructions: No driving smoking or alcohol  Medications at discharge. 1.  Tylenol as needed 2.  Amiodarone 400 mg p.o. daily 3.  Dalfampridine ER 10 mg p.o. twice daily 4.  Resume use of INSULIN pump 5.  Synthroid 125 mcg p.o. daily 6.  Toprol-XL 12.5 mg p.o. daily 7.  MiraLAX daily hold for loose stools 8.  Requip 0.25 mg p.o. nightly 9.  Aspirin 81 mg daily 10.  Senokot S2 tablets p.o. twice daily 11.  Xyzal 5 mg daily 12.  Zocor 20 mg daily 13.  Senokot S2 tablets twice daily.  Hold for loose stools 14.  Vitamin D 1000 units daily  30-35 minutes were spent completing discharge summary and discharge planning     Follow-up Information     Kirsteins, Cecilia Coe, MD Follow up.   Specialty: Physical Medicine and Rehabilitation Why: As directed Contact information: [address] [place] [address] [phone]         Verona Goodwill, MD Follow up.   Specialty: Cardiology Why: Call for appointment Contact information: [address] [place] [address] [phone]         Jorie Newness, MD Follow up.   Specialty: Neurology Why: Call for appointment Contact information: [address] [place] [address] [phone]                 Signed: Sterling Eisenmenger 09/03/2023, 5:20 PM

## 2023-09-03 NOTE — Progress Notes (Signed)
 SLP Cancellation Note  Patient Details Name: DRESHAUN STENE MRN: [id number] DOB: 1970/12/15   Cancelled treatment:       Reason Eval/Treat Not Completed: Other (comment) (Patient discharging to CIR today per notes)  Ferdinand Lango MA, CCC-SLP  Kema Santaella Meryl 09/03/2023, 11:08 AM

## 2023-09-04 DIAGNOSIS — G9340 Encephalopathy, unspecified: Secondary | ICD-10-CM | POA: Diagnosis not present

## 2023-09-04 DIAGNOSIS — G8220 Paraplegia, unspecified: Secondary | ICD-10-CM

## 2023-09-04 DIAGNOSIS — E1069 Type 1 diabetes mellitus with other specified complication: Secondary | ICD-10-CM

## 2023-09-04 DIAGNOSIS — G35 Multiple sclerosis: Secondary | ICD-10-CM | POA: Diagnosis not present

## 2023-09-04 LAB — CBC WITH DIFFERENTIAL/PLATELET
Abs Immature Granulocytes: 0.33 10*3/uL — ABNORMAL HIGH (ref 0.00–0.07)
Basophils Absolute: 0.1 10*3/uL (ref 0.0–0.1)
Basophils Relative: 0 %
Eosinophils Absolute: 0.4 10*3/uL (ref 0.0–0.5)
Eosinophils Relative: 3 %
HCT: 41.1 % (ref 39.0–52.0)
Hemoglobin: 13.8 g/dL (ref 13.0–17.0)
Immature Granulocytes: 2 %
Lymphocytes Relative: 14 %
Lymphs Abs: 1.8 10*3/uL (ref 0.7–4.0)
MCH: 28.7 pg (ref 26.0–34.0)
MCHC: 33.6 g/dL (ref 30.0–36.0)
MCV: 85.4 fL (ref 80.0–100.0)
Monocytes Absolute: 0.9 10*3/uL (ref 0.1–1.0)
Monocytes Relative: 7 %
Neutro Abs: 10.1 10*3/uL — ABNORMAL HIGH (ref 1.7–7.7)
Neutrophils Relative %: 74 %
Platelets: 253 10*3/uL (ref 150–400)
RBC: 4.81 MIL/uL (ref 4.22–5.81)
RDW: 13.5 % (ref 11.5–15.5)
WBC: 13.6 10*3/uL — ABNORMAL HIGH (ref 4.0–10.5)
nRBC: 0 % (ref 0.0–0.2)

## 2023-09-04 LAB — COMPREHENSIVE METABOLIC PANEL WITH GFR
ALT: 54 U/L — ABNORMAL HIGH (ref 0–44)
AST: 43 U/L — ABNORMAL HIGH (ref 15–41)
Albumin: 3 g/dL — ABNORMAL LOW (ref 3.5–5.0)
Alkaline Phosphatase: 73 U/L (ref 38–126)
Anion gap: 11 (ref 5–15)
BUN: 21 mg/dL — ABNORMAL HIGH (ref 6–20)
CO2: 24 mmol/L (ref 22–32)
Calcium: 8.8 mg/dL — ABNORMAL LOW (ref 8.9–10.3)
Chloride: 98 mmol/L (ref 98–111)
Creatinine, Ser: 1.14 mg/dL (ref 0.61–1.24)
GFR, Estimated: >60 mL/min (ref >60)
Glucose, Bld: 151 mg/dL — ABNORMAL HIGH (ref 70–99)
Potassium: 4.6 mmol/L (ref 3.5–5.1)
Sodium: 133 mmol/L — ABNORMAL LOW (ref 135–145)
Total Bilirubin: 0.6 mg/dL (ref 0.0–1.2)
Total Protein: 5.8 g/dL — ABNORMAL LOW (ref 6.5–8.1)

## 2023-09-04 LAB — GLUCOSE, CAPILLARY
Glucose-Capillary: 143 mg/dL — ABNORMAL HIGH (ref 70–99)
Glucose-Capillary: 153 mg/dL — ABNORMAL HIGH (ref 70–99)
Glucose-Capillary: 160 mg/dL — ABNORMAL HIGH (ref 70–99)
Glucose-Capillary: 238 mg/dL — ABNORMAL HIGH (ref 70–99)
Glucose-Capillary: 247 mg/dL — ABNORMAL HIGH (ref 70–99)
Glucose-Capillary: 306 mg/dL — ABNORMAL HIGH (ref 70–99)
Glucose-Capillary: 353 mg/dL — ABNORMAL HIGH (ref 70–99)
Glucose-Capillary: 92 mg/dL (ref 70–99)

## 2023-09-04 MED ORDER — INSULIN GLARGINE-YFGN 100 UNIT/ML ~~LOC~~ SOLN
5.0000 [IU] | Freq: Every day | SUBCUTANEOUS | Status: DC
  Administered 2023-09-04: 5 [IU] via SUBCUTANEOUS
  Filled 2023-09-04 (×2): qty 0.05

## 2023-09-04 MED ORDER — INSULIN GLARGINE-YFGN 100 UNIT/ML ~~LOC~~ SOLN
15.0000 [IU] | Freq: Every day | SUBCUTANEOUS | Status: DC
  Filled 2023-09-04: qty 0.15

## 2023-09-04 MED ORDER — INSULIN ASPART 100 UNIT/ML IJ SOLN: 0.0000 [IU] | Freq: Every day | INTRAMUSCULAR | Status: DC

## 2023-09-04 MED ORDER — INSULIN ASPART 100 UNIT/ML IJ SOLN
0.0000 [IU] | Freq: Three times a day (TID) | INTRAMUSCULAR | Status: DC
  Administered 2023-09-04: 9 [IU] via SUBCUTANEOUS
  Administered 2023-09-05: 3 [IU] via SUBCUTANEOUS
  Administered 2023-09-05: 2 [IU] via SUBCUTANEOUS
  Administered 2023-09-05: 1 [IU] via SUBCUTANEOUS

## 2023-09-04 MED ORDER — INSULIN ASPART 100 UNIT/ML IJ SOLN: 0.0000 [IU] | Freq: Three times a day (TID) | INTRAMUSCULAR | Status: DC

## 2023-09-04 NOTE — Progress Notes (Signed)
 Inpatient Rehabilitation Admission Medication Review by a Pharmacist  A complete drug regimen review was completed for this patient to identify any potential clinically significant medication issues.  High Risk Drug Classes Is patient taking? Indication by Medication  Antipsychotic No   Anticoagulant Yes Heparin- vte ppx  Antibiotic No   Opioid No   Antiplatelet No   Hypoglycemics/insulin Yes Insulin- T2DM  Vasoactive Medication Yes Toprol, Entresto, spironolactone, amiodarone- HF/BP  Chemotherapy No   Other Yes Requip- RLS Dalfapridine- MS Synthroid - hypothyroidism     Type of Medication Issue Identified Description of Issue Recommendation(s)  Drug Interaction(s) (clinically significant)     Duplicate Therapy     Allergy     No Medication Administration End Date     Incorrect Dose     Additional Drug Therapy Needed     Significant med changes from prior encounter (inform family/care partners about these prior to discharge).    Other  PTA meds: Zocor Xyzal Baclofen Restart PTA meds when and if necessary during CIR admission or at time of discharge, if warranted     Clinically significant medication issues were identified that warrant physician communication and completion of prescribed/recommended actions by midnight of the next day:  No    Time spent performing this drug regimen review (minutes):  30   Omero Kowal BS, PharmD, BCPS Clinical Pharmacist 09/04/2023 7:07 AM  Contact: [phone] after 3 PM  "Be curious, not judgmental..." -Debbora Dus

## 2023-09-04 NOTE — Progress Notes (Signed)
 Inpatient Rehabilitation  Patient information reviewed and entered into eRehab system by [facility]. Karen Kays., CCC/SLP, PPS Coordinator.  Information including medical coding, functional ability and quality indicators will be reviewed and updated through discharge.

## 2023-09-04 NOTE — Patient Care Conference (Signed)
 Inpatient RehabilitationTeam Conference and Plan of Care Update Date: 09/04/2023   Time: 10:09 AM    Patient Name: Johnathan Ross      Medical Record Number: [id number]  Date of Birth: Mar 10, 1971 Sex: Male         Room/Bed: 4M12C/4M12C-01 Payor Info: Payor: [employer] MEDICARE / Plan: [employer] MEDICARE CHOICE PPO / Product Type: *No Product type* /    Admit Date/Time:  09/03/2023  3:44 PM  Primary Diagnosis:  Acute encephalopathy  Hospital Problems: Principal Problem:   Acute encephalopathy    Expected Discharge Date: Expected Discharge Date:  (evals pending)  Team Members Present: Physician leading conference: Dr. Claudette Laws Social Worker Present: Cecile Sheerer, LCSWA Nurse Present: Chana Bode, RN PT Present: Ralph Leyden, PT OT Present: Mariann Barter, OT SLP Present: Other (comment) Alvera Novel SLP)     Current Status/Progress Goal Weekly Team Focus  Bowel/Bladder   LBM 4/8. Patient is continent and able to use urinal independently.   Patient continues to use urinal independently and pass stool.   Encourage voiding and verbalizing the need to have a bowel movement Qshift. Continue to give medications (eMAR) to assist with bowel management.    Swallow/Nutrition/ Hydration   eval pending           ADL's   eval pending   eval pending   eval pending    Mobility               Communication   eval pending            Safety/Cognition/ Behavioral Observations  eval pending            Pain   Patient currently doesn't report any pain.   Patient continues to be pain-free and verbalize any new pain onset.   Encourage patient to verbalize any new pain.    Skin   Patient has various bruises on the upper extremities (large bruise on inner R arm) and on bilateral lower legs. Has an incision from recent ICD implant.   Patient continues to have no complications r/t ICD incision and no new bruising.  Assess incision/bruising for changes Qshift.       Discharge Planning:  TBA. Per EMR, pt will d/c to home with support from mother and sister; girlfriend works during the day. SW will confirm there are no barriers to discharge.   Team Discussion: Patient post acute encephalopathy with history of retinopathy, T1 DM, bilateral lower extremity weakness and truncal instability with lower extremity edema.  Patient on target to meet rehab goals: yes, currently needs mod assist for bed mobility. Needs min - max assist for sit-stand and completes stand pivot transfers using a Stedy. Able to ambulate up to 34' using a Eva walker but limited by hyperextension of knees. Evaluations pending.  *See Care Plan and progress notes for long and short-term goals.   Revisions to Treatment Plan:  AFO (orthotic eval) Diabetic Coordinator referral   Teaching Needs: Insulin pump management, safety, medications, transfers, toileting, etc.   Current Barriers to Discharge: Decreased caregiver support and Home enviroment access/layout  Possible Resolutions to Barriers: Family education     Medical Summary Current Status: History of MS with chronic truncal weakness as well as lower extremity weakness.  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 Possible Resolutions to Barriers/Weekly Focus: Inpatient rehab eval, diabetes management, transition to insulin  pump once cognition improves   Continued Need for Acute Rehabilitation Level of Care: The patient requires daily medical management by a physician with specialized training in physical medicine and rehabilitation for the following reasons: Direction of a multidisciplinary physical rehabilitation program to maximize functional independence : Yes Medical management of patient stability for increased activity during participation in an intensive rehabilitation  regime.: Yes Analysis of laboratory values and/or radiology reports with any subsequent need for medication adjustment and/or medical intervention. : Yes   I attest that I was present, lead the team conference, and concur with the assessment and plan of the team.   Chana Bode B 09/04/2023, 3:53 PM

## 2023-09-04 NOTE — Plan of Care (Signed)
  Problem: Sit to Stand Goal: LTG:  Patient will perform sit to stand in prep for activites of daily living with assistance level (OT) Description: LTG:  Patient will perform sit to stand in prep for activites of daily living with assistance level (OT) Flowsheets (Taken 09/04/2023 1642) LTG: PT will perform sit to stand in prep for activites of daily living with assistance level: Independent with assistive device   Problem: RH Bathing Goal: LTG Patient will bathe all body parts with assist levels (OT) Description: LTG: Patient will bathe all body parts with assist levels (OT) Flowsheets (Taken 09/04/2023 1642) LTG: Pt will perform bathing with assistance level/cueing: Supervision/Verbal cueing   Problem: RH Dressing Goal: LTG Patient will perform lower body dressing w/assist (OT) Description: LTG: Patient will perform lower body dressing with assist, with/without cues in positioning using equipment (OT) Flowsheets (Taken 09/04/2023 1642) LTG: Pt will perform lower body dressing with assistance level of: Supervision/Verbal cueing   Problem: RH Toileting Goal: LTG Patient will perform toileting task (3/3 steps) with assistance level (OT) Description: LTG: Patient will perform toileting task (3/3 steps) with assistance level (OT)  Flowsheets (Taken 09/04/2023 1642) LTG: Pt will perform toileting task (3/3 steps) with assistance level: Supervision/Verbal cueing   Problem: RH Simple Meal Prep Goal: LTG Patient will perform simple meal prep w/assist (OT) Description: LTG: Patient will perform simple meal prep with assistance, with/without cues (OT). Flowsheets (Taken 09/04/2023 1642) LTG: Pt will perform simple meal prep with assistance level of: Supervision/Verbal cueing   Problem: RH Laundry Goal: LTG Patient will perform laundry w/assist, cues (OT) Description: LTG: Patient will perform laundry with assistance, with/without cues (OT). Flowsheets (Taken 09/04/2023 1642) LTG: Pt will perform laundry  with assistance level of: Supervision/Verbal cueing   Problem: RH Toilet Transfers Goal: LTG Patient will perform toilet transfers w/assist (OT) Description: LTG: Patient will perform toilet transfers with assist, with/without cues using equipment (OT) Flowsheets (Taken 09/04/2023 1642) LTG: Pt will perform toilet transfers with assistance level of: Supervision/Verbal cueing   Problem: RH Tub/Shower Transfers Goal: LTG Patient will perform tub/shower transfers w/assist (OT) Description: LTG: Patient will perform tub/shower transfers with assist, with/without cues using equipment (OT) Flowsheets (Taken 09/04/2023 1642) LTG: Pt will perform tub/shower stall transfers with assistance level of: Contact Guard/Touching assist   Problem: RH Memory Goal: LTG Patient will demonstrate ability for day to day recall/carry over during activities of daily living with assistance level (OT) Description: LTG:  Patient will demonstrate ability for day to day recall/carry over during activities of daily living with assistance level (OT). Flowsheets (Taken 09/04/2023 1642) LTG:  Patient will demonstrate ability for day to day recall/carry over during activities of daily living with assistance level (OT): Supervision   Problem: RH Awareness Goal: LTG: Patient will demonstrate awareness during functional activites type of (OT) Description: LTG: Patient will demonstrate awareness during functional activites type of (OT) Flowsheets (Taken 09/04/2023 1642) Patient will demonstrate awareness during functional activites type of: Anticipatory LTG: Patient will demonstrate awareness during functional activites type of (OT): Supervision

## 2023-09-04 NOTE — Evaluation (Signed)
 Speech Language Pathology Assessment and Plan  Patient Details  Name: Johnathan Ross MRN: [id number] Date of Birth: 05-10-1971  SLP Diagnosis: Cognitive Impairments  Rehab Potential: Good ELOS: ~2 weeks   Today's Date: 09/04/2023 SLP Individual Time: 7829-5621 SLP Individual Time Calculation (min): 53 min  Hospital Problem: Principal Problem:   Acute encephalopathy  Past Medical History:  Past Medical History:  Diagnosis Date   Hyperlipidemia    Hypertension    Hypogonadism in male 11/01/2016   Hypothyroidism    Multiple sclerosis (HCC)    Neuromuscular disorder (HCC)    Proliferative diabetic retinopathy(362.02)    Type 1 diabetes mellitus (HCC) dx'd 1994   Vitamin D insufficiency 11/29/2016   Past Surgical History:  Past Surgical History:  Procedure Laterality Date   EYE SURGERY Bilateral    "laser for diabetic retinopathy"   FRACTURE SURGERY     ICD IMPLANT N/A 08/30/2023   Procedure: ICD IMPLANT;  Surgeon: Regan Lemming, MD;  Location: [facility];  Service: Cardiovascular;  Laterality: N/A;   IR VENO/EXT/UNI LEFT  03/24/2020   OPEN REDUCTION INTERNAL FIXATION (ORIF) TIBIA/FIBULA FRACTURE Right 10/30/2016   ORIF ANKLE FRACTURE Left 2015   ORIF TIBIA FRACTURE Right 10/30/2016   Procedure: OPEN REDUCTION INTERNAL FIXATION (ORIF) TIBIA FIBULA FRACTURE;  Surgeon: Myrene Galas, MD;  Location: [facility] OR;  Service: Orthopedics;  Laterality: Right;   RETINAL LASER PROCEDURE Bilateral    "for diabetic retinopathy"   RIGHT/LEFT HEART CATH AND CORONARY ANGIOGRAPHY N/A 08/26/2023   Procedure: RIGHT/LEFT HEART CATH AND CORONARY ANGIOGRAPHY;  Surgeon: Dorthula Nettles, DO;  Location: [facility];  Service: Cardiovascular;  Laterality: N/A;    Assessment / Plan / Recommendation Clinical Impression  Patient is a 53 y.o. male with PMH: MS, HTN, DM-1, hypothyroidism, neuromuscular disorder, proliferative diabetic retinopathy. He presented to the hospital on 08/26/23  after apparently collapsing and requiring bystander CPR before EMS arrived. He was shocked eight times and given Epi x2 and Amiodarone, CPR lasted 20 minutes. He was intubated and brought to the ED. CT head and CTA chest did not reveal any intracranial abnormalities and no PE, but did show extensive consolidation throughout lungs. SLP evaluation order for cognition ordered on 08/30/23 in acute for recommendations for continued services.   Cognitive-Linguistic Evaluation: Patient completed patient interview and RBANS revealing severe deficits in the areas of visuospatial skills and delayed memory as well as borderling deficits in the areas of immediate memory, attention, and language. Patient demonstrates impaired awareness of deficits. Receptive/expressive language and motor speech appears WFL. Patient would benefit from continued ST services targeting memory and awareness to promote return to completion of iADLs and increase independence. Patient was left in lowered bed with call bell in reach and bed alarm set. SLP will continue to target goals per plan of care.     Skilled Therapeutic Interventions          SLP conducted skilled cognitive-linguistic evaluation utilizing RBANS and patient interview. Full results above.    SLP Assessment  Patient will need skilled Speech Lanaguage Pathology Services during CIR admission    Recommendations  Recommendations for Other Services: Neuropsych consult Patient destination: Home Follow up Recommendations: Outpatient SLP;24 hour supervision/assistance;Home Health SLP Equipment Recommended: None recommended by SLP    SLP Frequency 1 to 3 out of 7 days   SLP Duration  SLP Intensity  SLP Treatment/Interventions ~2 weeks  Minumum of 1-2 x/day, 30 to 90 minutes  Cognitive remediation/compensation;Environmental controls;Cueing hierarchy;Functional tasks;Therapeutic Activities;Internal/external  aids;Patient/family education    Pain Pain Assessment Pain  Scale: 0-10 Pain Score: 0-No pain  Prior Functioning Cognitive/Linguistic Baseline: Within functional limits Type of Home: House  Lives With: Significant other Available Help at Discharge: Family Education: one year college Vocation: On disability  SLP Evaluation Cognition Overall Cognitive Status: Impaired/Different from baseline Arousal/Alertness: Awake/alert Orientation Level: Oriented X4 Year: 2025 Month: April Day of Week: Correct Attention: Sustained;Selective Sustained Attention: Appears intact Sustained Attention Impairment: Functional complex Selective Attention: Impaired Selective Attention Impairment: Verbal complex;Verbal basic Memory: Impaired Memory Impairment: Storage deficit;Retrieval deficit;Decreased recall of new information;Decreased short term memory Decreased Short Term Memory: Verbal basic;Functional basic Awareness: Impaired Awareness Impairment: Emergent impairment Problem Solving: Impaired Problem Solving Impairment: Verbal complex Executive Function: Self Monitoring;Self Correcting;Reasoning Reasoning: Impaired Reasoning Impairment: Verbal complex Self Monitoring: Impaired Self Monitoring Impairment: Verbal complex Self Correcting: Impaired Self Correcting Impairment: Verbal complex Behaviors: Impulsive Safety/Judgment: Impaired  Comprehension Auditory Comprehension Overall Auditory Comprehension: Appears within functional limits for tasks assessed Expression Expression Primary Mode of Expression: Verbal Verbal Expression Overall Verbal Expression: Appears within functional limits for tasks assessed Written Expression Dominant Hand: Right Oral Motor Oral Motor/Sensory Function Overall Oral Motor/Sensory Function: Within functional limits Motor Speech Overall Motor Speech: Appears within functional limits for tasks assessed  Care Tool Care Tool Cognition Ability to hear (with hearing aid or hearing appliances if normally used Ability  to hear (with hearing aid or hearing appliances if normally used): 0. Adequate - no difficulty in normal conservation, social interaction, listening to TV   Expression of Ideas and Wants Expression of Ideas and Wants: 4. Without difficulty (complex and basic) - expresses complex messages without difficulty and with speech that is clear and easy to understand   Understanding Verbal and Non-Verbal Content Understanding Verbal and Non-Verbal Content: 4. Understands (complex and basic) - clear comprehension without cues or repetitions  Memory/Recall Ability Memory/Recall Ability : Current season;That he or she is in a hospital/hospital unit   Short Term Goals: Week 1: SLP Short Term Goal 1 (Week 1): Patient will utilize external/internal memory aids to recall new daily information with 80% accuracy given mod assist. SLP Short Term Goal 2 (Week 1): Patient will demonstrate awareness of cognitive deficits by stating 2 deficits and corresponding compensatory strategies given mod assist.  Refer to Care Plan for Long Term Goals  Recommendations for other services: Neuropsych  Discharge Criteria: Patient will be discharged from SLP if patient refuses treatment 3 consecutive times without medical reason, if treatment goals not met, if there is a change in medical status, if patient makes no progress towards goals or if patient is discharged from hospital.  The above assessment, treatment plan, treatment alternatives and goals were discussed and mutually agreed upon: by patient  Jeannie Done, M.A., CCC-SLP  Yetta Barre 09/04/2023, 12:18 PM
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
## 2023-09-04 NOTE — Plan of Care (Signed)
  Problem: RH Memory Goal: LTG Patient will demonstrate ability for day to day (SLP) Description: LTG:   Patient will demonstrate ability for day to day recall/carryover during cognitive/linguistic activities with assist  (SLP) Flowsheets (Taken 09/04/2023 1219) LTG: Patient will demonstrate ability for day to day recall: New information LTG: Patient will demonstrate ability for day to day recall/carryover during cognitive/linguistic activities with assist (SLP): Supervision Goal: LTG Patient will use memory compensatory aids to (SLP) Description: LTG:  Patient will use memory compensatory aids to recall biographical/new, daily complex information with cues (SLP) Flowsheets (Taken 09/04/2023 1219) LTG: Patient will use memory compensatory aids to (SLP): Supervision   Problem: RH Awareness Goal: LTG: Patient will demonstrate awareness during functional activites type of (SLP) Description: LTG: Patient will demonstrate awareness during functional activites type of (SLP) Flowsheets (Taken 09/04/2023 1219) Patient will demonstrate during cognitive/linguistic activities awareness type of: Intellectual LTG: Patient will demonstrate awareness during cognitive/linguistic activities with assistance of (SLP): Supervision

## 2023-09-04 NOTE — Progress Notes (Signed)
 PROGRESS NOTE   Subjective/Complaints:  Patient states he slept well last night.  We discussed his functional level at home.  He ambulated with a walker.  He has weakness in both legs and was supposed to be wearing AFOs but he was not very compliant with this.  Review of systems: Denies chest pains, shortness of breath, nausea vomiting diarrhea constipation   Objective:   No results found. Recent Labs    09/03/23 2113 09/04/23 0507  WBC 15.5* 13.6*  HGB 15.0 13.8  HCT 44.8 41.1  PLT 283 253   Recent Labs    09/02/23 0520 09/03/23 2113 09/04/23 0507  NA 137  --  133*  K 4.1  --  4.6  CL 99  --  98  CO2 24  --  24  GLUCOSE 114*  --  151*  BUN 19  --  21*  CREATININE 1.03 1.24 1.14  CALCIUM 8.7*  --  8.8*    Intake/Output Summary (Last 24 hours) at 09/04/2023 0710 Last data filed at 09/04/2023 0435 Gross per 24 hour  Intake 480 ml  Output 2425 ml  Net -1945 ml        Physical Exam: Vital Signs Blood pressure 123/63, pulse 67, temperature 97.8 F (36.6 C), resp. rate 18, height 6\' 1"  (1.854 m), weight 95.4 kg, SpO2 99%.  General: No acute distress Mood and affect are appropriate Heart: Regular rate and rhythm no rubs murmurs or extra sounds Lungs: Clear to auscultation, breathing unlabored, no rales or wheezes Abdomen: Positive bowel sounds, soft nontender to palpation, nondistended Extremities: No clubbing, cyanosis, or edema Skin: No evidence of breakdown, no evidence of rash Neurologic: Cranial nerves II through XII intact, motor strength is 5/5 in bilateral deltoid, bicep, tricep, grip, 2-hip flexor and abd/add , 3-knee extensors, traceankle dorsiflexor and plantar flexor Sensory exam normal sensation to light touch and proprioception in bilateral upper and lower extremities Cerebellar exam normal finger to nose to finger as well as heel to shin in bilateral upper and lower extremities Musculoskeletal:  Full range of motion in all 4 extremities. No joint swelling    Assessment/Plan: 1. Functional deficits which require 3+ hours per day of interdisciplinary therapy in a comprehensive inpatient rehab setting. Physiatrist is providing close team supervision and 24 hour management of active medical problems listed below. Physiatrist and rehab team continue to assess barriers to discharge/monitor patient progress toward functional and medical goals  Care Tool:  Bathing              Bathing assist       Upper Body Dressing/Undressing Upper body dressing        Upper body assist      Lower Body Dressing/Undressing Lower body dressing            Lower body assist       Toileting Toileting    Toileting assist       Transfers Chair/bed transfer  Transfers assist           Locomotion Ambulation   Ambulation assist              Walk 10 feet activity   Assist  Walk 50 feet activity   Assist           Walk 150 feet activity   Assist           Walk 10 feet on uneven surface  activity   Assist           Wheelchair     Assist               Wheelchair 50 feet with 2 turns activity    Assist            Wheelchair 150 feet activity     Assist          Blood pressure 123/63, pulse 67, temperature 97.8 F (36.6 C), resp. rate 18, height 6\' 1"  (1.854 m), weight 95.4 kg, SpO2 99%.  Medical Problem List and Plan: 1. Functional deficits secondary to cardiac arrest/V-fib/V. tach with anoxic encephalopathy             -patient may shower             -ELOS/Goals: 13-20 days, supervision to min assist goals with PT, OT, SLP             -pt has a pair of ipsilon AFO's. Given his profound ankle weakness in addition to his weakness proximally, he needs a more supportive AFO ([place]er?). PT and orthotist to assess.  2.  Antithrombotics: -DVT/anticoagulation:  Pharmaceutical: Heparin              -antiplatelet therapy: N/A 3. Pain Management: Tylenol as needed 4. Mood/Behavior/Sleep: Provide emotional support             -antipsychotic agents: N/A 5. Neuropsych/cognition: This patient is not capable of making decisions on his own behalf. 6. Skin/Wound Care: Routine skin checks 7. Fluids/Electrolytes/Nutrition: Routine in and outs with follow-up chemistries             -pt appears to have good appetite 8.  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   Status post ICD 08/30/2023.   -continue Amiodarone 400 mg daily, Toprol-XL 25 mg daily, Entresto 49-51 mg twice daily, Aldactone 25 mg daily 9.  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  Latest hemoglobin A1c 7.3.  Semglee 35 units daily.   CBG (last 3)  Recent Labs    09/04/23 0302 09/04/23 0432 09/04/23 0608  GLUCAP 92 153* 143*  Elevated blood sugar today improved control this a.m. will continue current management -Presently not using insulin pump. Have advised pt/family to hold off on pump until his memory and overall cognition is improved.  10.  Hypothyroidism.  Synthroid 11.  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  Followed by neurology services Dr. Epimenio Foot..  Continue Dalfampridine 10 mg twice daily as well as Requip for restless legs             -pt states that restless legs have been better while in hospital May need orthotic management while at the hospital.  However has been noncompliant with his current orthoses. 12.  AKI on CKD stage III.  Follow-up chemistries tomorrow      LOS: 1 days A FACE TO FACE EVALUATION WAS PERFORMED  Erick Colace 09/04/2023, 7:10 AM

## 2023-09-05 DIAGNOSIS — G9340 Encephalopathy, unspecified: Secondary | ICD-10-CM | POA: Diagnosis not present

## 2023-09-05 DIAGNOSIS — E1069 Type 1 diabetes mellitus with other specified complication: Secondary | ICD-10-CM | POA: Diagnosis not present

## 2023-09-05 DIAGNOSIS — G8220 Paraplegia, unspecified: Secondary | ICD-10-CM | POA: Diagnosis not present

## 2023-09-05 DIAGNOSIS — G35 Multiple sclerosis: Secondary | ICD-10-CM | POA: Diagnosis not present

## 2023-09-05 LAB — GLUCOSE, CAPILLARY
Glucose-Capillary: 208 mg/dL — ABNORMAL HIGH (ref 70–99)
Glucose-Capillary: 239 mg/dL — ABNORMAL HIGH (ref 70–99)
Glucose-Capillary: 417 mg/dL — ABNORMAL HIGH (ref 70–99)

## 2023-09-05 MED ORDER — INSULIN GLARGINE-YFGN 100 UNIT/ML ~~LOC~~ SOLN
10.0000 [IU] | Freq: Every day | SUBCUTANEOUS | Status: DC
  Filled 2023-09-05 (×2): qty 0.1

## 2023-09-05 NOTE — Inpatient Diabetes Management (Signed)
 Inpatient Diabetes Program Recommendations  AACE/ADA: New Consensus Statement on Inpatient Glycemic Control (2015)  Target Ranges:  Prepandial:   less than 140 mg/dL      Peak postprandial:   less than 180 mg/dL (1-2 hours)      Critically ill patients:  140 - 180 mg/dL   Lab Results  Component Value Date   GLUCAP 417 (H) 09/05/2023   HGBA1C 7.3 (H) 07/08/2023    Review of Glycemic Control Diabetes Type 1 Medtronic 780 G with guardian CGM ad Novolog insulin Insulin pump settings are as follows:   Basal insulin  12A0.1 units/hour 4A        1.15 units/hour 6A        1.20 units/hour 10P0.15 units/hour   Total daily basal insulin: 22.2 units/24 hours   Car[place]       1:15     1 unit for every 15 grams of carbohydrates 0700    1:8       1 unit for every 8 grams of carbohydrates 1200    1:9.5    1 unit for every 9.5 grams of carbohydrates 1800    1:121 unit for every 12 grams of carbohydrates 2200    1:20     1 unit for every 20 grams of carbohydrates Insulin Sensitivity 1:351 unit drops blood glucose 35 mg/dl  Insulin pump inserted on left lower abd started today 4/10 (change every 3 days), Guardian CGM applied to back of left arm started today 4/10 (change every 7 days).   -   RN use "insulin pump" flowsheet [facility] as a site assessment  -   Also use "CGM" flowsheet to document the blood sugar  Will follow and monitor glucose trends.  Thanks,  Christena Deem RN, MSN, BC-ADM Inpatient Diabetes Coordinator Team Pager [phone] (8a-5p)

## 2023-09-05 NOTE — Plan of Care (Signed)
  Problem: RH Memory Goal: LTG Patient will demonstrate ability for day to day (SLP) Description: LTG:   Patient will demonstrate ability for day to day recall/carryover during cognitive/linguistic activities with assist  (SLP) Outcome: Completed/Met Goal: LTG Patient will use memory compensatory aids to (SLP) Description: LTG:  Patient will use memory compensatory aids to recall biographical/new, daily complex information with cues (SLP) Outcome: Completed/Met   Problem: RH Awareness Goal: LTG: Patient will demonstrate awareness during functional activites type of (SLP) Description: LTG: Patient will demonstrate awareness during functional activites type of (SLP) Outcome: Completed/Met

## 2023-09-05 NOTE — Care Management (Signed)
 Inpatient Rehabilitation Center Individual Statement of Services  Patient Name:  Johnathan Ross  Date:  09/05/2023  Welcome to the Inpatient Rehabilitation Center.  Our goal is to provide you with an individualized program based on your diagnosis and situation, designed to meet your specific needs.  With this comprehensive rehabilitation program, you will be expected to participate in at least 3 hours of rehabilitation therapies Monday-Friday, with modified therapy programming on the weekends.  Your rehabilitation program will include the following services:  Physical Therapy (PT), Occupational Therapy (OT), Speech Therapy (ST), 24 hour per day rehabilitation nursing, Therapeutic Recreaction (TR), Psychology, Neuropsychology, Care Coordinator, Rehabilitation Medicine, Nutrition Services, Pharmacy Services, and Other  Weekly team conferences will be held on Wednesdays to discuss your progress.  Your Inpatient Rehabilitation Care Coordinator will talk with you frequently to get your input and to update you on team discussions.  Team conferences with you and your family in attendance may also be held.  Expected length of stay: 7-10 days    Overall anticipated outcome: Independent with Assistive Device  Depending on your progress and recovery, your program may change. Your Inpatient Rehabilitation Care Coordinator will coordinate services and will keep you informed of any changes. Your Inpatient Rehabilitation Care Coordinator's name and contact numbers are listed  below.  The following services may also be recommended but are not provided by the Inpatient Rehabilitation Center:  Driving Evaluations Home Health Rehabiltiation Services Outpatient Rehabilitation Services Vocational Rehabilitation   Arrangements will be made to provide these services after discharge if needed.  Arrangements include referral to agencies that provide these services.  Your insurance has been verified to be:  [employer]  Your primary doctor is:  Sanda Linger  Pertinent information will be shared with your doctor and your insurance company.  Inpatient Rehabilitation Care Coordinator:  Susie Cassette [phone] or ([phone]  Information discussed with and copy given to patient by: Gretchen Short, 09/05/2023, 9:30 AM

## 2023-09-05 NOTE — Progress Notes (Signed)
 Speech Language Pathology Discharge Summary  Patient Details  Name: Johnathan Ross MRN: [id number] Date of Birth: 1971-05-05  Date of Discharge from SLP service:September 05, 2023  Today's Date: 09/05/2023 SLP Individual Time: 1300-1346 SLP Individual Time Calculation (min): 46 min  Skilled Therapeutic Interventions:  SLP conducted skilled therapy session targeting cognitive retraining goals. SLP initially attempted to initiate medication management task. Throughout this portion of the session, patient exhibited defensive behaviors with it becoming evident patient was attempted to "prove" that his memory was intact despite assurance from SLP that medication portion of session was purely informative in nature. Patient reports that he would rather not listen to education re: medications because he is just going to "take what we give him" so it "doesn't matter". SLP thus switched to short term memory task. Patient recalled information from presented picture immediately with 85% accuracy with modI. SLP corrected mistakes to assist with accurate delayed recall; after 8 minute distracted delay, recalled all information with 100% accuracy, incorporating SLP corrections independently. Patient was left in room with call bell in reach and alarm set. Of note, patient performed exceedingly well on short term memory tasks this date compared to evaluation. Additionally, family reports patient has returned to baseline level of function, and all previously observed cognitive changes observed while patient was on acute care have since resolved. Given this improvement and information, plan to d/c from SLP caseload. See discharge summary below.   Patient has met 3 of 3 long term goals.  Patient to discharge at overall Modified Independent level.  Reasons goals not met: n/a   Clinical Impression/Discharge Summary:  Since SLP conducted cognitive evaluation, patient's cognitive deficits have cleared with additional family  reports stating patient is back to baseline level of function. Patient is modI for all cognitive tasks conducted during most recent SLP session showing improvement specifically in the area of delayed memory. SLP discussed patient progress with patient's family who indicated that all changes observed during acute stay have since resolved. No further SLP services indicated at next venue of care. SLP will sign off.   Care Partner:  Caregiver Able to Provide Assistance: Yes (PRN)  Type of Caregiver Assistance: Cognitive  Recommendation:  None      Equipment: n/a   Reasons for discharge: Treatment goals met   Patient/Family Agrees with Progress Made and Goals Achieved: Yes  Jeannie Done, M.A., CCC-SLP  Yetta Barre 09/05/2023, 2:30 PM

## 2023-09-05 NOTE — Progress Notes (Addendum)
 Patient ID: Johnathan Ross, male   DOB: [birth year], 53 y.o.   MRN: [id number]  0932- SW left message for pt mother Lynden Ang to introduce self, explaining role, discussed discharge process, confirm discharge plan, and schedule family edu next Tues or Wednesday. SW waiting on follow-up.   0958-SW spoke with pt s/o Rachandia to introduce self, explaining role, discussed discharge process, confirm discharge plan, and schedule family edu next Tues or Wednesday. Fam edu on Tuesday Michae Kava, MSW, LCSW Office: [phone] Cell: [phone] Fax: [phone]

## 2023-09-05 NOTE — Progress Notes (Signed)
 Inpatient Rehabilitation Care Coordinator Assessment and Plan Patient Details  Name: Johnathan Ross MRN: [id number] Date of Birth: 16-Jul-1970  Today's Date: 09/05/2023  Hospital Problems: Principal Problem:   Acute encephalopathy  Past Medical History:  Past Medical History:  Diagnosis Date   Hyperlipidemia    Hypertension    Hypogonadism in male 11/01/2016   Hypothyroidism    Multiple sclerosis (HCC)    Neuromuscular disorder (HCC)    Proliferative diabetic retinopathy(362.02)    Type 1 diabetes mellitus (HCC) dx'd 1994   Vitamin D insufficiency 11/29/2016   Past Surgical History:  Past Surgical History:  Procedure Laterality Date   EYE SURGERY Bilateral    "laser for diabetic retinopathy"   FRACTURE SURGERY     ICD IMPLANT N/A 08/30/2023   Procedure: ICD IMPLANT;  Surgeon: Regan Lemming, MD;  Location: [facility] INVASIVE CV LAB;  Service: Cardiovascular;  Laterality: N/A;   IR VENO/EXT/UNI LEFT  03/24/2020   OPEN REDUCTION INTERNAL FIXATION (ORIF) TIBIA/FIBULA FRACTURE Right 10/30/2016   ORIF ANKLE FRACTURE Left 2015   ORIF TIBIA FRACTURE Right 10/30/2016   Procedure: OPEN REDUCTION INTERNAL FIXATION (ORIF) TIBIA FIBULA FRACTURE;  Surgeon: Myrene Galas, MD;  Location: [facility] OR;  Service: Orthopedics;  Laterality: Right;   RETINAL LASER PROCEDURE Bilateral    "for diabetic retinopathy"   RIGHT/LEFT HEART CATH AND CORONARY ANGIOGRAPHY N/A 08/26/2023   Procedure: RIGHT/LEFT HEART CATH AND CORONARY ANGIOGRAPHY;  Surgeon: Dorthula Nettles, DO;  Location: [facility] INVASIVE CV LAB;  Service: Cardiovascular;  Laterality: N/A;   Social History:  reports that he has never smoked. He has never used smokeless tobacco. He reports that he does not drink alcohol and does not use drugs.  Family / Support Systems Marital Status: Single Patient Roles: Partner Spouse/Significant Other: Mitzie Na (girlfriend) Children: adult son (lives in his home) Other Supports: Mother and sister Anticipated  Caregiver: S/o, mother, and sister Ability/Limitations of Caregiver: S/O works during the day; pt mother and sister to assist while she is at work. Caregiver Availability: 24/7 Family Dynamics: Pt lives with his son and girlfriend.  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 Education: some Charity fundraiser - How often do you need to have someone help you when you read instructions, pamphlets, or other written material from your doctor or pharmacy?: Never Writes: Yes Employment Status: Disabled Date Retired/Disabled/Unemployed: 2015 Marine scientist Issues: Denies Guardian/Conservator: Denies   Abuse/Neglect Abuse/Neglect Assessment Can Be Completed: Yes Physical Abuse: Denies Verbal Abuse: Denies Sexual Abuse: Denies Exploitation of patient/patient's resources: Denies Self-Neglect: Denies  Patient response to: Social Isolation - How often do you feel lonely or isolated from those around you?: Never  Emotional Status Pt's affect, behavior and adjustment status: Pt in good spirits at time of visit. Pt is eager to go home and states he is doing alot of the things heis doing now at home. Recent Psychosocial Issues: Denies Psychiatric History: Denies Substance Abuse History: Denies  Patient / Family Perceptions, Expectations & Goals Pt/Family understanding of illness & functional limitations: Pt and family have a general understanding of care needs Premorbid pt/family roles/activities: Independent Anticipated changes in roles/activities/participation: Assistance with ADLs/IADLs Pt/family expectations/goals: Pt goal is to work on getting off his RW and he was already working on this before he came in the hospital; has been in outpatient PT.  Community [employer] Agencies: None Premorbid Home Care/DME Agencies: Other (Comment) ([facility] for  PT.) Transportation available at discharge: TBD Is the patient able to respond to transportation needs?: Yes In the past 12 months, has lack of transportation kept you from medical appointments or from getting medications?: No In the past 12 months, has lack of transportation kept you from meetings, work, or from getting things needed for daily living?: No Resource referrals recommended: Neuropsychology  Discharge Planning Living Arrangements: Alone Support Systems: Spouse/significant other, Parent, Other relatives Type of Residence: Private residence Insurance Resources: Media planner (specify) ([employer] Medicare) Financial Resources: SSD Financial Screen Referred: Yes Living Expenses: Mortgage Money Management: Patient, Spouse Does the patient have any problems obtaining your medications?: No Home Management: Pt girlfriend prepares all meals, and he does all housecleaning Patient/Family Preliminary Plans: TBD Care Coordinator Barriers to Discharge: Decreased caregiver support, Insurance for SNF coverage, Lack of/limited family support Care Coordinator Anticipated Follow Up Needs: HH/OP Expected length of stay: 7-10 days  Clinical Impression SW met with pt and pt mother in room. Pt is not a Cytogeneticist. No HCPOA. DME: RW, cane, and w/c. SW shared family edu scheduled for Tuesday.   Gretchen Short 09/05/2023, 3:34 PM

## 2023-09-05 NOTE — Progress Notes (Signed)
 Physical Therapy Session Note  Patient Details  Name: Johnathan Ross MRN: [id number] Date of Birth: Jul 28, 1970  Today's Date: 09/05/2023 PT Individual Time: 0808-0928 PT Individual Time Calculation (min): 80 min   Short Term Goals: Week 1:  PT Short Term Goal 1 (Week 1): STG = LTG d/t ELOS  Skilled Therapeutic Interventions/Progress Updates: Patient sitting EOB on entrance to room. Patient alert and agreeable to PT session.   Patient reported no pain during session. Beginning of session focused on building pt rapport. PTA encouraged pt to bring personal custom WC during inpatient stay to work on increasing independence to [employer] (pt reported only using a few times since received - PTA further emphasized that although pt is able to ambulate short distances now, that it is good to stay on top of WC propulsion due to MS progression with pt understanding). Pt also had personal B AFO's brought from home.  Therapeutic Activity: Transfers: Pt performed sit<>stand transfers throughout session with RW and with CGA (from EOB<WC). Provided VC to increase retro-step length to touch edge of sitting surface vs attempting to sit and an angle. Pt transported from room<>main gym in [facility] for energy conservation.  - Pt ambulated short distance in room in order to void urine. Pt required light minA to maintain standing balance while in RW in order to void bladder. Pt cued to remain upright to avoid anterior LOB. Pt performed anterior pericare without assistance, and ambulated to chair in room with minA and increased time/VC to shift weight laterally to increase step clearance on contralateral LE.  Gait Training:  Pt ambulated 125' using RW with CGA/light minA for safety and weight shifting. Pt demonstrated the following gait deviations with therapist providing the described cuing and facilitation for improvement: (B AFO donned with shoe caps) - Hip hike (R>L)  - several moments of hyperextending on B  knees - decreased B hip flexion  - slight supinated B in stance (lateral bottom of shoes more worn than medial - pt reported not really noticing it prior to admission). - 2 short standing rest breaks due to fatiuge  Wheelchair Mobility:  Pt propelled wheelchair from room<main gym with supervision  Neuromuscular Re-ed: NMR facilitated during session with focus on neuromuscular connection/control of B hip flexors. - B hip flexion with PTA passively moving LE with VC for pt to hold position (maxA at top hip flexion with min/CGA when closer to 90 * - performed sitting in WC). Pt also cued and educated on to exhale slowly through eccentric vs holding breath with noted increase in ability to control hold).  NMR performed for improvements in motor control and coordination, balance, sequencing, judgement, and self confidence/ efficacy in performing all aspects of mobility at highest level of independence.   Therapeutic Exercise: Pt performed the following exercises with therapist providing the described cuing and facilitation for improvement. - B hip flexion with PTA passively moving LE with VC for pt to hold position (maxA at top hip flexion with min/CGA when closer to 90 * - performed sitting in WC). Pt also cued and educated on to exhale slowly through eccentric vs holding breath with noted increase in ability to control hold).  Manual Therapy: Palpation of B upper trap musculature performed with trigger points noted. Education and rationale provided with pt agreeing to participate in intervention. - Trigger point release to stated area with soft tissue mobilization to follow throughout.   Patient sitting in chair at end of session with brakes locked, chair alarm  set, and all needs within reach.   Therapy Documentation Precautions:  Precautions Precautions: Fall Recall of Precautions/Restrictions: Impaired Precaution/Restrictions Comments: MS with LB weakness Required Braces or Orthoses:  Other Brace Other Brace: AFOs in room Restrictions Weight Bearing Restrictions Per Provider Order: No Other Position/Activity Restrictions: AFOs in room, no shoes  Therapy/Group: Individual Therapy  Garvis Downum PTA 09/05/2023, 11:05 AM

## 2023-09-05 NOTE — Progress Notes (Signed)
 Patient ID: Johnathan Ross, male   DOB: 04/09/1971, 53 y.o.   MRN: [id number] Met with the patient to review current situation, rehab process, team conference and plan of care.  Met briefly to review short LOS estimation but team will meet to set date and looking to set up family education.  Sister on phone at the time, reported family plans to stay with him for at least a week post discharge. Need to review medications with the patient and [facility] dietary modification recommendations. Family education to be set up with S.O. Tele # provided to SW.  Continue to follow along to address educational needs to facilitate preparation for discharge. Pamelia Hoit

## 2023-09-05 NOTE — Evaluation (Addendum)
 Physical Therapy Assessment and Plan  Patient Details  Name: Johnathan Ross MRN: [id number] Date of Birth: [birth year]  PT Diagnosis: Difficulty walking, Impaired sensation, and Muscle weakness Rehab Potential: Good ELOS: 7 days   Today's Date: 09/04/2023 PT Individual Time: 7829-5621  PT Individual Time Calculation (min): 85 min  Hospital Problem: Principal Problem:   Acute encephalopathy   Past Medical History:  Past Medical History:  Diagnosis Date   Hyperlipidemia    Hypertension    Hypogonadism in male 11/01/2016   Hypothyroidism    Multiple sclerosis (HCC)    Neuromuscular disorder (HCC)    Proliferative diabetic retinopathy(362.02)    Type 1 diabetes mellitus (HCC) dx'd 1994   Vitamin D insufficiency 11/29/2016   Past Surgical History:  Past Surgical History:  Procedure Laterality Date   EYE SURGERY Bilateral    "laser for diabetic retinopathy"   FRACTURE SURGERY     ICD IMPLANT N/A 08/30/2023   Procedure: ICD IMPLANT;  Surgeon: Regan Lemming, MD;  Location: [facility];  Service: Cardiovascular;  Laterality: N/A;   IR VENO/EXT/UNI LEFT  03/24/2020   OPEN REDUCTION INTERNAL FIXATION (ORIF) TIBIA/FIBULA FRACTURE Right 10/30/2016   ORIF ANKLE FRACTURE Left 2015   ORIF TIBIA FRACTURE Right 10/30/2016   Procedure: OPEN REDUCTION INTERNAL FIXATION (ORIF) TIBIA FIBULA FRACTURE;  Surgeon: Myrene Galas, MD;  Location: [facility] OR;  Service: Orthopedics;  Laterality: Right;   RETINAL LASER PROCEDURE Bilateral    "for diabetic retinopathy"   RIGHT/LEFT HEART CATH AND CORONARY ANGIOGRAPHY N/A 08/26/2023   Procedure: RIGHT/LEFT HEART CATH AND CORONARY ANGIOGRAPHY;  Surgeon: Dorthula Nettles, DO;  Location: [facility];  Service: Cardiovascular;  Laterality: N/A;    Assessment & Plan Clinical Impression: Patient is a 53 y.o. handed male with history of hyperlipidemia, hypertension, hypothyroidism, diabetes mellitus with proliferative diabetic retinopathy and  has a insulin pump, multiple sclerosis followed by neurology service Dr. Epimenio Foot maintained on Dalfampridine. Per chart review patient lives with girlfriend. 1 level home 4 steps to entry. Girlfriend works outside the home. Modified independent mobility. Modified independent with ADLs. Presented 08/26/2023 after collapsing in the community and receiving CPR before he was found by EMS. Patient was found to have ventricular fibrillation. He went through 20 minutes of ACLS protocol, was intubated and brought to the emergency room was found to be in sinus rhythm with prolonged QTc. CT of the head and CTA of the chest did not reveal any acute abnormalities although there was some concern of aspiration in the lungs. He remained intubated 08/26/2023-08/27/2023. Echocardiogram showed ejection fraction of 30 to 35%. The left ventricle demonstrating global hypokinesis. EEG suggestive of moderate to severe diffuse encephalopathy no seizure. Admission chemistries unremarkable except glucose 150, troponin 290-1 50, urine drug screen positive benzos. Postarrest EKG normal sinus rhythm with low voltage in short P-RR interval. No STE. Cardiac MRI done showed no evidence of LGE but did show some mild to moderate decrease in left ventricular systolic function of 40% and a mild decrease in right ventricular systolic function. Underwent cardiac catheterization showing mild nonobstructive CAD severely elevated precapillary filling pressures. Hemodynamics consistent with suboptimal RV function. He was started on milrinone. Hospital course followed closely by cardiology services underwent ICD/PPM 08/30/2023. Elevated leukocytosis 27,100 as well as suggestion of possible aspiration placed on Rocephin per CCM. It was felt that possibly elevated leukocytosis was due to to steroid demargination received during pacer placement. He was cleared to begin subcutaneous heparin for DVT prophylaxis. AKI on CKD  stage III latest creatinine 1.21. Abnormal LFTs  in the setting of shock and cardiac arrest and monitored. He is tolerating a regular diet. Therapy evaluations completed due to patient decreased functional mobility was admitted for a comprehensive rehab program.  Patient transferred to CIR on 09/03/2023 .   Patient currently requires min with mobility secondary to muscle weakness, decreased cardiorespiratoy endurance, unbalanced muscle activation, and decreased standing balance, decreased postural control, and decreased balance strategies.  Prior to hospitalization, patient was modified independent  with mobility and lived with Significant other, Son in a single level House .  Home access is 4Stairs to enter.  Patient will benefit from skilled PT intervention to maximize safe functional mobility, minimize fall risk, and decrease caregiver burden for planned discharge home with 24 hour assist.  Anticipate patient will benefit from follow up OP at discharge.  PT - End of Session Activity Tolerance: Tolerates 30+ min activity with multiple rests Endurance Deficit: Yes PT Assessment Rehab Potential (ACUTE/IP ONLY): Good PT Barriers to Discharge: Inaccessible home environment;Insurance for SNF coverage;Other (comments) (change in DM medication and monitoring) PT Patient demonstrates impairments in the following area(s): Balance;Edema;Endurance;Motor;Nutrition;Safety;Sensory PT Transfers Functional Problem(s): Bed Mobility;Bed to Chair;Car;Furniture PT Locomotion Functional Problem(s): Ambulation;Wheelchair Mobility;Stairs PT Plan PT Intensity: Minimum of 1-2 x/day ,45 to 90 minutes PT Frequency: 5 out of 7 days PT Duration Estimated Length of Stay: 7 days PT Treatment/Interventions: Ambulation/gait training;Cognitive remediation/compensation;Discharge planning;DME/adaptive equipment instruction;Functional mobility training;Pain management;Psychosocial support;Splinting/orthotics;Therapeutic Activities;UE/LE Strength taining/ROM;Balance/vestibular  training;Community reintegration;Disease management/prevention;Neuromuscular re-education;Patient/family education;Skin care/wound management;Stair training;Therapeutic Exercise;UE/LE Coordination activities;Wheelchair propulsion/positioning PT Transfers Anticipated Outcome(s): supervision PT Locomotion Anticipated Outcome(s): supervision PT Recommendation Recommendations for Other Services: Therapeutic Recreation consult Therapeutic Recreation Interventions: Stress management Follow Up Recommendations: Outpatient PT;24 hour supervision/assistance Patient destination: Home Equipment Recommended: To be determined   PT Evaluation Precautions/Restrictions Precautions Precautions: Fall Precaution/Restrictions Comments: MS with LB weakness Required Braces or Orthoses: Other Brace Other Brace: AFOs in room Restrictions Weight Bearing Restrictions Per Provider Order: No General   Vital SignsTherapy Vitals Temp: 98.2 F (36.8 C) Pulse Rate: 73 Resp: 18 BP: 108/70 Patient Position (if appropriate): Sitting Oxygen Therapy SpO2: 96 % O2 Device: Room Air Pain Pain Assessment Pain Scale: 0-10 Pain Score: 0-No pain Pain Interference Pain Interference Pain Effect on Sleep: 1. Rarely or not at all Pain Interference with Therapy Activities: 1. Rarely or not at all Pain Interference with Day-to-Day Activities: 1. Rarely or not at all Home Living/Prior Functioning Home Living Available Help at Discharge: Family Type of Home: House Home Access: Stairs to enter Secretary/administrator of Steps: 4 Entrance Stairs-Rails: Left;Right;Can reach both Home Layout: One level Bathroom Shower/Tub: Engineer, manufacturing systems: Standard Bathroom Accessibility: Yes  Lives With: Significant other;Son Prior Function Level of Independence: Requires assistive device for independence;Independent with transfers;Independent with basic ADLs  Able to Take Stairs?: Yes Driving: Yes Vocation: On  disability Leisure: Hobbies-yes (Comment) (works with wood in his shop behind house) Vision/Perception  Vision - History Ability to See in Adequate Light: 0 Adequate Perception Perception: Within Functional Limits Praxis Praxis: WFL  Cognition Overall Cognitive Status: Within Functional Limits for tasks assessed Arousal/Alertness: Awake/alert Orientation Level: Oriented X4 Attention: Sustained Sustained Attention: Appears intact Memory: Appears intact Awareness: Appears intact Problem Solving: Appears intact Safety/Judgment: Impaired (Knows his limitations but has always learned to compensate in some way, wants to be IND for as long as possible) Sensation Sensation Light Touch: Impaired Detail Light Touch Impaired Details: Impaired RLE;Impaired LUE Coordination Gross Motor Movements are Fluid and Coordinated:  No Fine Motor Movements are Fluid and Coordinated: Yes Coordination and Movement Description: decreased coordination d/t BLE weakness and increased edema Heel Shin Test: unable to perform Motor      Trunk/Postural Assessment  Cervical Assessment Cervical Assessment: Exceptions to [facility] (forward head and rounded shoudler 2/2 muscle tightness at traps) Thoracic Assessment Thoracic Assessment: Exceptions to [facility] (decreased trunk control in standing) Lumbar Assessment Lumbar Assessment: Exceptions to [facility] (mild post tilt in sitting) Postural Control Postural Control: Deficits on evaluation Trunk Control: decreased in standing, relies heavily on B UEs supported on RW in standing  Balance Balance Balance Assessed: Yes Static Sitting Balance Static Sitting - Balance Support: Feet supported Static Sitting - Level of Assistance: 5: Stand by assistance Dynamic Sitting Balance Dynamic Sitting - Balance Support: Feet supported;No upper extremity supported Dynamic Sitting - Level of Assistance: 5: Stand by assistance Static Standing Balance Static Standing - Balance Support:  During functional activity;Bilateral upper extremity supported Static Standing - Level of Assistance: Other (comment) (CGA) Dynamic Standing Balance Dynamic Standing - Balance Support: During functional activity;Bilateral upper extremity supported Dynamic Standing - Level of Assistance: 4: Min assist Extremity Assessment      RLE Assessment RLE Assessment: Exceptions to [facility] RLE Strength RLE Overall Strength: Deficits Right Hip Flexion: 3-/5 Right Hip Extension: 3/5 Right Hip ABduction: 3+/5 Right Hip ADduction: 3+/5 Right Knee Flexion: 3-/5 Right Knee Extension: 4-/5 Right Ankle Dorsiflexion: 1/5 Right Ankle Plantar Flexion: 2-/5 LLE Assessment LLE Assessment: Exceptions to WFL LLE Strength LLE Overall Strength: Deficits Left Hip Flexion: 3-/5 Left Hip Extension: 3/5 Left Hip ABduction: 3+/5 Left Hip ADduction: 3+/5 Left Knee Flexion: 3-/5 Left Knee Extension: 3+/5 Left Ankle Dorsiflexion: 1/5 Left Ankle Plantar Flexion: 2-/5  Care Tool Care Tool Bed Mobility Roll left and right activity   Roll left and right assist level: Contact Guard/Touching assist    Sit to lying activity   Sit to lying assist level: Contact Guard/Touching assist    Lying to sitting on side of bed activity   Lying to sitting on side of bed assist level: the ability to move from lying on the back to sitting on the side of the bed with no back support.: Contact Guard/Touching assist     Care Tool Transfers Sit to stand transfer   Sit to stand assist level: Contact Guard/Touching assist    Chair/bed transfer   Chair/bed transfer assist level: Minimal Assistance - Patient > 75%    Car transfer   Car transfer assist level: Minimal Assistance - Patient > 75%;Contact Guard/Touching assist      Care Tool Locomotion Ambulation   Assist level: Minimal Assistance - Patient > 75% Assistive device: Walker-rolling Max distance: 25 ft  Walk 10 feet activity   Assist level: Minimal Assistance -  Patient > 75% Assistive device: Walker-rolling   Walk 50 feet with 2 turns activity Walk 50 feet with 2 turns activity did not occur: Safety/medical concerns      Walk 150 feet activity Walk 150 feet activity did not occur: Safety/medical concerns      Walk 10 feet on uneven surfaces activity Walk 10 feet on uneven surfaces activity did not occur: Safety/medical concerns      Stairs   Assist level: Minimal Assistance - Patient > 75% Stairs assistive device: 2 hand rails Max number of stairs: 2  Walk up/down 1 step activity   Walk up/down 1 step (curb) assist level: Minimal Assistance - Patient > 75% Walk up/down 1 step or curb assistive  device: 2 hand rails  Walk up/down 4 steps activity Walk up/down 4 steps activity did not occur: Safety/medical concerns      Walk up/down 12 steps activity Walk up/down 12 steps activity did not occur: Safety/medical concerns      Pick up small objects from floor   Pick up small object from the floor assist level: Total Assistance - Patient < 25% Pick up small object from the floor assistive device: did not have reacher - only RW  Wheelchair Is the patient using a wheelchair?: Yes Type of Wheelchair: Manual   Wheelchair assist level: Supervision/Verbal cueing;Contact Guard/Touching assist Max wheelchair distance: 75 ft  Wheel 50 feet with 2 turns activity   Assist Level: Minimal Assistance - Patient > 75%;Contact Guard/Touching assist  Wheel 150 feet activity   Assist Level: Moderate Assistance - Patient 50 - 74%    Refer to Care Plan for Long Term Goals  SHORT TERM GOAL WEEK 1 PT Short Term Goal 1 (Week 1): STG = LTG d/t ELOS  Recommendations for other services: Therapeutic Recreation  Stress management  Skilled Therapeutic Intervention Mobility Bed Mobility Bed Mobility: Rolling Right;Rolling Left;Sit to Supine Rolling Right: Contact Guard/Touching assist Rolling Left: Contact Guard/Touching assist Sit to Supine: Contact  Guard/Touching assist Transfers Transfers: Stand to Sit;Sit to Stand Sit to Stand: Contact Guard/Touching assist Stand to Sit: Contact Guard/Touching assist Transfer (Assistive device): Rolling walker Locomotion  Gait Ambulation: Yes Gait Assistance: Contact Guard/Touching assist;Minimal Assistance - Patient > 75% Gait Distance (Feet): 25 Feet Assistive device: Rolling walker Gait Assistance Details: Verbal cues for safe use of DME/AE;Verbal cues for precautions/safety Gait Gait: Yes Gait Pattern: Impaired Gait Pattern: Step-through pattern;Decreased step length - left;Decreased step length - right;Decreased hip/knee flexion - right;Decreased hip/knee flexion - left;Decreased dorsiflexion - right;Decreased dorsiflexion - left;Right hip hike;Left hip hike;Right genu recurvatum;Left genu recurvatum;Poor foot clearance - right;Poor foot clearance - left Gait velocity: decr Stairs / Additional Locomotion Stairs: Yes Stairs Assistance: Minimal Assistance - Patient > 75% Stair Management Technique: Two rails;Step to pattern;Forwards Number of Stairs: 2 Height of Stairs: 6 Wheelchair Mobility Wheelchair Mobility: Yes Wheelchair Assistance: Doctor, general practice: Both upper extremities Wheelchair Parts Management: Supervision/cueing Distance: 75 ft  Skilled Intervention: PT Evaluation completed; see above for results. PT educated patient in roles of PT vs OT, PT POC, rehab potential, rehab goals, and discharge recommendations along with recommendation for follow-up rehabilitation services. Individual treatment initiated:  Patient seated upright in w/c upon PT arrival. Patient alert and agreeable to PT session.   No pain complaint during session.  Pt relates that w/c is uncomfortable. Acquired taller height w/c with roho cushion for improved positioning and comfort at end of session.   Therapeutic Activity: Pt and family relates pt's PLOF and quality of  movement about the house. Pt relates that he has been able to navigate steps to get in/ out of home using BHR. They question current AFOs that are in the room. Currently has [employer]. Family and pt relate concerns that pt does not have correct AFOs for his needs. Explained that MDs on previous floors recommended different AFOs ("blue rocker"). Related that pt will have assessment with PT and CPO while here on unit. Will need shoes in order to assess. Relates that shoes do have toe caps but does not think that they help.  Gait Training:  Patient ambulated 25 ft using RW with up to MinA for stability at hips. Demonstrated extreme weakness in BLE with difficulty producing  adequate hip flexion and no DF. Slides foot forward with significant hip hike. BUE is extremely strong and compensates  for LE weakness. Is able to produce reciprocal step pattern. No buckling noted but does reach uncontrolled TKE with each weight bear. Locks knee into TKE and then does not have strength to unlock extension, however does have strength to prevent buckle.   Navigates steps requiring RW to approach then change UE support to handrails. Requires this therapist to remove RW. Without AFOs, pt has difficulty stepping each foot singly to step with significant hip hike. On second step, pt uses BUE braced into handrails in order to bring both feet to second step. Is able to turn on step with BUE handholds throughout. He is then able use BUE support to lift each LE leading with LLE to step down. BUE assist RLE in lowering to step. MinA provided throughout. With RW is able to  stand pivot back to w/c.   Wheelchair Mobility:  Patient propelled wheelchair ~75 feet with CGA at most. Provided vc/tc for technique but is very strong with BUE.   Reaches car simulator and is able to transfer to car without use of AD but does grab onto car door to steady self. Educated not to use car door and states understanding. Requires BUE assist  to bring LEs into/ out of footwell of car.   Patient seated upright on EOB  at end of session with brakes locked, bed alarm set, and all needs within reach.   Discharge Criteria: Patient will be discharged from PT if patient refuses treatment 3 consecutive times without medical reason, if treatment goals not met, if there is a change in medical status, if patient makes no progress towards goals or if patient is discharged from hospital.  The above assessment, treatment plan, treatment alternatives and goals were discussed and mutually agreed upon: by patient and by family  Loel Dubonnet PT, DPT, CSRS 09/04/2023, 6:13 PM

## 2023-09-05 NOTE — Progress Notes (Addendum)
 PROGRESS NOTE   Subjective/Complaints:  CHronic LE edema since ankle fracture , was not using WC except in community PTA  Review of systems: Denies chest pains, shortness of breath, nausea vomiting diarrhea constipation   Objective:   No results found. Recent Labs    09/03/23 2113 09/04/23 0507  WBC 15.5* 13.6*  HGB 15.0 13.8  HCT 44.8 41.1  PLT 283 253   Recent Labs    09/03/23 2113 09/04/23 0507  NA  --  133*  K  --  4.6  CL  --  98  CO2  --  24  GLUCOSE  --  151*  BUN  --  21*  CREATININE 1.24 1.14  CALCIUM  --  8.8*    Intake/Output Summary (Last 24 hours) at 09/05/2023 0749 Last data filed at 09/05/2023 0500 Gross per 24 hour  Intake 720 ml  Output 2500 ml  Net -1780 ml        Physical Exam: Vital Signs Blood pressure 108/70, pulse 73, temperature 98.2 F (36.8 C), resp. rate 18, height 6\' 1"  (1.854 m), weight 94.5 kg, SpO2 96%.  General: No acute distress Mood and affect are appropriate Heart: Regular rate and rhythm no rubs murmurs or extra sounds Lungs: Clear to auscultation, breathing unlabored, no rales or wheezes Abdomen: Positive bowel sounds, soft nontender to palpation, nondistended Extremities: No clubbing, cyanosis, or edema Skin: Stasis dermatitis BLE Neurologic: Cranial nerves II through XII intact, motor strength is 5/5 in bilateral deltoid, bicep, tricep, grip, 2-hip flexor and abd/add , 3-knee extensors, traceankle dorsiflexor and plantar flexor Sensory exam normal sensation to light touch and proprioception in bilateral upper and lower extremities Cerebellar no dysmetria in UEs  Musculoskeletal: Full range of motion in all 4 extremities. No joint swelling    Assessment/Plan: 1. Functional deficits which require 3+ hours per day of interdisciplinary therapy in a comprehensive inpatient rehab setting. Physiatrist is providing close team supervision and 24 hour management of  active medical problems listed below. Physiatrist and rehab team continue to assess barriers to discharge/monitor patient progress toward functional and medical goals  Care Tool:  Bathing    Body parts bathed by patient: Right arm, Left arm, Abdomen, Chest, Front perineal area, Right upper leg, Left upper leg, Face, Left lower leg, Right lower leg   Body parts bathed by helper: Buttocks     Bathing assist Assist Level: Minimal Assistance - Patient > 75% (simulated EOB)     Upper Body Dressing/Undressing Upper body dressing   What is the patient wearing?: Pull over shirt    Upper body assist Assist Level: Set up assist    Lower Body Dressing/Undressing Lower body dressing      What is the patient wearing?: Pants     Lower body assist Assist for lower body dressing: Minimal Assistance - Patient > 75% (completing lateral leans)     Toileting Toileting    Toileting assist Assist for toileting: Moderate Assistance - Patient 50 - 74%     Transfers Chair/bed transfer  Transfers assist     Chair/bed transfer assist level: Minimal Assistance - Patient > 75%     Locomotion Ambulation   Ambulation  assist      Assist level: Minimal Assistance - Patient > 75% Assistive device: Walker-rolling Max distance: 25 ft   Walk 10 feet activity   Assist     Assist level: Minimal Assistance - Patient > 75% Assistive device: Walker-rolling   Walk 50 feet activity   Assist Walk 50 feet with 2 turns activity did not occur: Safety/medical concerns         Walk 150 feet activity   Assist Walk 150 feet activity did not occur: Safety/medical concerns         Walk 10 feet on uneven surface  activity   Assist Walk 10 feet on uneven surfaces activity did not occur: Safety/medical concerns         Wheelchair     Assist Is the patient using a wheelchair?: Yes Type of Wheelchair: Manual    Wheelchair assist level: Supervision/Verbal cueing, Contact  Guard/Touching assist Max wheelchair distance: 75 ft    Wheelchair 50 feet with 2 turns activity    Assist        Assist Level: Minimal Assistance - Patient > 75%, Contact Guard/Touching assist   Wheelchair 150 feet activity     Assist      Assist Level: Moderate Assistance - Patient 50 - 74%   Blood pressure 108/70, pulse 73, temperature 98.2 F (36.8 C), resp. rate 18, height 6\' 1"  (1.854 m), weight 94.5 kg, SpO2 96%.  Medical Problem List and Plan: 1. Functional deficits secondary to cardiac arrest/V-fib/V. tach with anoxic encephalopathy             -patient may shower             -ELOS/Goals: 13-20 days, supervision to min assist goals with PT, OT, SLP             -pt has a pair of ipsilon AFO's. Given his profound ankle weakness in addition to his weakness proximally, he needs a more supportive AFO ([place]?). PT and orthotist to assess.  2.  Antithrombotics: -DVT/anticoagulation:  Pharmaceutical: Heparin             -antiplatelet therapy: N/A 3. Pain Management: Tylenol as needed 4. Mood/Behavior/Sleep: Provide emotional support             -antipsychotic agents: N/A 5. Neuropsych/cognition: This patient is not capable of making decisions on his own behalf. 6. Skin/Wound Care: Routine skin checks 7. Fluids/Electrolytes/Nutrition: Routine in and outs with follow-up chemistries             -pt appears to have good appetite 8.  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 Chronic LE edema  Status post ICD 08/30/2023.   -continue Amiodarone 400 mg daily, Toprol-XL 25 mg daily, Entresto 49-51 mg twice daily, Aldactone 25 mg daily 9.  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  Latest hemoglobin A1c 7.3.  Semglee 35 units daily.   CBG (last 3)  Recent Labs    09/04/23 2058 09/05/23 0125 09/05/23 0627  GLUCAP 238* 208* 239*  Elevated blood sugar today improved control this a.m. increase PM lantus to  10U -Presently not using insulin pump. Have advised pt/family to hold off on pump until his memory and overall cognition is improved. Will ask nursing to help assess  10.  Hypothyroidism.  Synthroid 11.  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  Followed by neurology services Dr. Epimenio Foot..  Continue Dalfampridine 10 mg twice daily as well as  Requip for restless legs             -pt states that restless legs have been better while in hospital May need orthotic management while at the hospital.  However has been noncompliant with his current orthoses. 12.  AKI on CKD stage III.    Latest Ref Rng & Units 09/04/2023    5:07 AM 09/03/2023    9:13 PM 09/02/2023    5:20 AM  BMP  Glucose 70 - 99 mg/dL 962   952   BUN 6 - 20 mg/dL 21   19   Creatinine 8.41 - 1.24 mg/dL 3.24  4.01  0.27   Sodium 135 - 145 mmol/L 133   137   Potassium 3.5 - 5.1 mmol/L 4.6   4.1   Chloride 98 - 111 mmol/L 98   99   CO2 22 - 32 mmol/L 24   24   Calcium 8.9 - 10.3 mg/dL 8.8   8.7        LOS: 2 days A FACE TO FACE EVALUATION WAS PERFORMED  Erick Colace 09/05/2023, 7:49 AM

## 2023-09-05 NOTE — Progress Notes (Signed)
 Occupational Therapy Session Note  Patient Details  Name: Johnathan Ross MRN: [id number] Date of Birth: 1971-03-19  Today's Date: 09/05/2023 OT Individual Time: 0945-1100 OT Individual Time Calculation (min): 75 min    Short Term Goals: Week 1:  OT Short Term Goal 1 (Week 1): Pt will complete toilet transfers with min A consistently using LRAD OT Short Term Goal 2 (Week 1): Pt will complete 3/3 toileting tasks min A using LRAD and AE PRN OT Short Term Goal 3 (Week 1): Pt will maintain dynamic standing balance during BADLs and functional tasks min A using LRAD OT Short Term Goal 4 (Week 1): Pt will tolerate standing >2 minutes during BADLs or functional tasks using LRAD  Skilled Therapeutic Interventions/Progress Updates:     Pt received in recliner ready for therapy.  Pt stated he had already washed up and dressed at the sink this am. Pt reports he did not need assistance.      ADL Retraining: -ambulation in and out of bathroom with RW with pt moving into a B hop/swing through and completely relying on his arms as he said his legs were very fatigued from walking 125 ft with PT.  Pt stood at toilet with supervision to urinate.  Cues to use grab bars and not rely on towel shelf as that is not stable. -tub transfers from w/c to tub bench in tub room. Pt reports he has a tub bench but has never used it as he feels as though it is a "crutch".  He has been stepping over tub wall at home.  Strongly recommended he use the bench just to get in and out and to wash feet, he can stand for most of his shower.  Pt was able to practice the transfer with supervision.   Suggested he have a grab bar installed. Pt asking about suction bar and I explained it does work but due to humidity it can unstick easily so it would need to be checked before he actually uses it -pt stated therapy has recommended a toe cap for times his toe catches when walking, pt stated he does not want one as he feels it will be obvious  when out in public.  Showed pt the shoe brand [employer] that is designed for people with MS that may be an option instead of the toe cap.    Therapeutic Activity/ Exercise: -for cardiac conditioning, pt transferred to arm bike and complete 10 min following a paced track.  Pt completed exercise but was sweating at the end. HR 90 and O2 99%.  Transfers: -supervision with all squat or stand pivots today  Balance: -pt held balance in standing with close S and using 1 hand for support on bar or RW  Therapeutic Activity: -to assess reaction time and divided attention, had pt work on a visual scanning response activity on BITS.  On avg, pt had a reaction time of 1 second and accuracy of 90-92%.  Pt demonstrated good reaction time.     Pt is very anxious to go home.   He states he truly feels he is back at his baseline.  He said he has to modify work/rest with his MS.  His mother was in the room and she stated he will have plenty of support at home and she also feels like he is at his baseline.  Told them I need to consult with PT and SLP but maybe he could discharge in a few days vs a full week or  more depending on team and MD input.   Pt resting in w/c with all needs met. Call light in reach.    Therapy Documentation Precautions:  Precautions Precautions: Fall Recall of Precautions/Restrictions: Impaired Precaution/Restrictions Comments: MS with LB weakness Required Braces or Orthoses: Other Brace Other Brace: AFOs in room Restrictions Weight Bearing Restrictions Per Provider Order: No Other Position/Activity Restrictions: AFOs in room, no shoes    Vital Signs: Therapy Vitals Temp: 98.2 F (36.8 C) Pulse Rate: 73 Resp: 18 BP: 108/70 Patient Position (if appropriate): Sitting Oxygen Therapy SpO2: 96 % O2 Device: Room Air Pain: Pain Assessment Pain Scale: 0-10 Pain Score: 0-No pain   Therapy/Group: Individual Therapy  Konstantina Nachreiner 09/05/2023, 8:43 AM

## 2023-09-06 DIAGNOSIS — G9340 Encephalopathy, unspecified: Secondary | ICD-10-CM | POA: Diagnosis not present

## 2023-09-06 DIAGNOSIS — G8220 Paraplegia, unspecified: Secondary | ICD-10-CM | POA: Diagnosis not present

## 2023-09-06 DIAGNOSIS — G35 Multiple sclerosis: Secondary | ICD-10-CM | POA: Diagnosis not present

## 2023-09-06 DIAGNOSIS — E1069 Type 1 diabetes mellitus with other specified complication: Secondary | ICD-10-CM | POA: Diagnosis not present

## 2023-09-06 MED ORDER — INSULIN PUMP
Freq: Three times a day (TID) | SUBCUTANEOUS | Status: DC
  Administered 2023-09-10: 5 via SUBCUTANEOUS
  Administered 2023-09-10: 4 via SUBCUTANEOUS
  Administered 2023-09-11: 3 via SUBCUTANEOUS
  Filled 2023-09-06: qty 1

## 2023-09-06 NOTE — Progress Notes (Signed)
 Occupational Therapy Session Note  Patient Details  Name: Johnathan Ross MRN: [id number] Date of Birth: 1970/07/21  Today's Date: 09/06/2023 OT Individual Time: 1324-4010 OT Individual Time Calculation (min): 60 min    Short Term Goals: Week 1:  OT Short Term Goal 1 (Week 1): Pt will complete toilet transfers with min A consistently using LRAD OT Short Term Goal 2 (Week 1): Pt will complete 3/3 toileting tasks min A using LRAD and AE PRN OT Short Term Goal 3 (Week 1): Pt will maintain dynamic standing balance during BADLs and functional tasks min A using LRAD OT Short Term Goal 4 (Week 1): Pt will tolerate standing >2 minutes during BADLs or functional tasks using LRAD  Skilled Therapeutic Interventions/Progress Updates:   Pt received sitting EOB dressed and ready for the day. Pt stated he completed his self care well.   He transferred squat pivot bed to w/c with supervision.  Discussed spacing out meals at home as pt often does not eat until later in the day.  Suggested spacing out protein to help with sustained endurance.  Pt taken to gym and transferred to mat with S.      Pt described his HEP of bridges, single bridges, bird dogs on quadriped,  cross chops on knees hips bent to hips extended.    For UE back strength and postural strength.  Pt used heavy blue theraband for B arm pulls with cues for scapular retraction and depression, single arm rows (bow and arrow),  and chest expansion stretch with focus on scapular retraction.  B shoulder stretch with band behind back.  Resisted torso twists with band. Pt able to rotate arms to his left but when rotating to his right he lost his truncal balance 3 x.  Pt stated 'wow my core has not been this weak".  For tight upper back with limited shoulder extension:  Supine sunbathers stretch and overhead arm stretch with pulling blue band apart.   Tried to have pt hold B knees bent as he would when doing his bridges at home but pt unable to  maintain bent knees with hips in neutral.  Small abd curls with using band as an active assist.  Pt unable to engage core well, tried with one knee to chest and back but pt having difficulty controlling his legs.  Getting on and off mat supine to sit his left leg dragged on mat.  Pt is clearly weaker in LEs than his baseline.  Reminded pt this is normal to have muscle weakness and deconditioning post cardiac incident and hospital stay.   Pt is very eager to leave as soon as possible. MD stated he may be ready to discharge early next week but not sooner.  Educated pt that this time in rehab is important to build back some strength.    Pt self propelled back to his room. In room with all needs met.   Therapy Documentation Precautions:  Precautions Precautions: Fall Recall of Precautions/Restrictions: Impaired Precaution/Restrictions Comments: MS with LB weakness Required Braces or Orthoses: Other Brace Other Brace: AFOs in room Restrictions Weight Bearing Restrictions Per Provider Order: No Other Position/Activity Restrictions: AFOs in room, no shoes   Pain:  No c/o pain  ADL: ADL Eating: Independent, Set up Where Assessed-Eating: Chair Grooming: Setup Where Assessed-Grooming: Chair Upper Body Bathing: Supervision/safety Where Assessed-Upper Body Bathing: Edge of bed Lower Body Bathing: Minimal assistance Where Assessed-Lower Body Bathing: Edge of bed Upper Body Dressing: Setup Where Assessed-Upper Body Dressing: Edge of  bed Lower Body Dressing: Minimal assistance (completing lateral leans) Where Assessed-Lower Body Dressing: Edge of bed Toileting: Moderate assistance Where Assessed-Toileting: Teacher, adult education: Moderate assistance Toilet Transfer Method: Squat pivot, Stand pivot Toilet Transfer Equipment: Grab bars Tub/Shower Transfer: Not assessed Film/video editor: Not assessed   Therapy/Group: Individual Therapy  Joneric Streight 09/06/2023, 12:52 PM

## 2023-09-06 NOTE — Progress Notes (Signed)
 Occupational Therapy Session Note  Patient Details  Name: Johnathan Ross MRN: [id number] Date of Birth: 17-Feb-1971  Today's Date: 09/06/2023 OT Individual Time: 1191-4782 OT Individual Time Calculation (min): 43 min    Short Term Goals: Week 1:  OT Short Term Goal 1 (Week 1): Pt will complete toilet transfers with min A consistently using LRAD OT Short Term Goal 2 (Week 1): Pt will complete 3/3 toileting tasks min A using LRAD and AE PRN OT Short Term Goal 3 (Week 1): Pt will maintain dynamic standing balance during BADLs and functional tasks min A using LRAD OT Short Term Goal 4 (Week 1): Pt will tolerate standing >2 minutes during BADLs or functional tasks using LRAD  Skilled Therapeutic Interventions/Progress Updates:    Pt received sitting EOB with his sister present, no c/o pain. He c/o fatigue from sessions and requested to "not do anymore walking". Stand pivot to the w/c with CGA using the RW, already wearing B AFOs. He propelled the w/c to the tehrapt gym with (S). He completed standing level functional reaching at the BITS to challenge standing balance and tolerance with only unilateral support on the RW. His posture became gradually more kyphotic with fatigue. He then transferred to prone on the mat with min A. He completed glute bridges for posterior chain strengthening with adductor activation with block. 3x10 repetitions with core activation exercises super-set between to challenge endurance as well. He then completed 3x10 circuit of lat pull overs, chest press, and trunk rotation with a 7 lb dowel to challenge UE strengthening and stability for improved support on the RW. He returned to the w/c following and to his room. Pt left sitting up with sister present.   Therapy Documentation Precautions:  Precautions Precautions: Fall Recall of Precautions/Restrictions: Impaired Precaution/Restrictions Comments: MS with LB weakness Required Braces or Orthoses: Other Brace Other  Brace: AFOs in room Restrictions Weight Bearing Restrictions Per Provider Order: No Other Position/Activity Restrictions: AFOs in room, no shoes  Therapy/Group: Individual Therapy  Crissie Reese 09/06/2023, 6:24 AM

## 2023-09-06 NOTE — Progress Notes (Signed)
 Physical Therapy Session Note  Patient Details  Name: Johnathan Ross MRN: [id number] Date of Birth: May 29, 1970  Today's Date: 09/06/2023 PT Individual Time: 1020-1120; 1305 - 1350  PT Individual Time Calculation (min): 60 min; 45 min   Short Term Goals: Week 1:  PT Short Term Goal 1 (Week 1): STG = LTG d/t ELOS  SESSION 1 Skilled Therapeutic Interventions/Progress Updates: Patient sitting in WC on entrance to room. Patient alert and agreeable to PT session.   Patient reported unrated pain at pacemaker site on L chest (attending MD made aware). Pt further educated that using primarily B UE strength to advance on stairs or gait with support from railing and handles on RW increases pectoral contraction, and to decrease amount of compensation used (pt understanding).  Pt sister in room at end of session with PTA updating on what therapy has so far consisted of, pts deficits (specifically decreased hip flexor strength) and pt's combination of debility from hospital stay and MS.   Therapeutic Activity: Transfers: Pt performed sit<>stand pivot transfer from WC<EOB with no AD and CGA/light minA with VC to weight shift to pivot steps accordingly.   Stair Navigation: - 4 (6") steps ascending/descending with B UE support. On first two steps, pt cued to only use B LE musculature to advance (modA for safety) with added cuing to keep upright posture vs forward flexed, and to shift weight accordingly vs laterally flexing trunk. Pt then cued to perform method personally stated, which is to have one UE on railing, and the other to advance (contralateral UE) onto next step. Pt then with minA final 2 steps ascending. Pt pivoting at top minA and VC for safety awareness. Pt descended with minA and reciprocal step pattern with "spring-like" motion at hips to advance LE downward.  - 2nd round on same steps with pt advancing B LE to each step (step to ascending) with contralateral UE while ipsilateral UE stabilizes  on railing. Pt closer to min/light minA this round for safety due to pt having to forwardly flex. Pt descended with light minA in same pattern (reciprocal with anterior hip compensation).   Manual Therapy: Palpation of L upper trap musculature performed with trigger points noted. Education and rationale provided with pt agreeing to participate in intervention. - Trigger point release to stated area with soft tissue mobilization to follow throughout. Pt reported increase in comfortability following first time doing so previous day. Pt also reported being able to flex B shoulders a little more than baseline (observable).   SESSION 2 Skilled Therapeutic Interventions/Progress Updates: Patient sitting EOB with sister present on entrance to room. Patient alert and agreeable to PT session.   Patient with no complaints during session. [employer] [facility] representative arrived to assess pt's B AFO's (personally owned).   Therapeutic Activity: Transfers: Pt performed sit<>stand pivot transfers throughout session with no AD and with modA this  session with VC to avoid B feet crossing, and to pivot towards sitting surface instead. Pt requires WB to allow appropriate weight to advance contralateral LE.  Gait Training:  Pt ambulated roughly 31' x 2 using RW with CGA throughout. Pt with hip compensation to advance LE through swing (R>L). Pt also with tendency to lean to R side. Hyperextension noted to occur throughout. Pt with slight improvement in cadence vs yesterday's session with this PTA. [employer] rep present and provided education and encouragement for pt to obtain shoes without air component in back of heel as this increases pt's risk to hyperextend. Pt does  not need toe-cap at this time as pt has ability to swing LE without catching unsafely to floor. Pt stated he will obtain new shoes as encouraged. Pt required multiple standing rest breaks. Pt cued to weight shift appropriately, and to depress B shoulders to  avoid increase in WB on RW with noted increase in pt's ability to advance LE through swing with less hip compensation (pt reported this has been baseline for a while now).   Neuromuscular Re-ed: NMR facilitated during session with focus on neuromuscular connection/control of B quadriceps. - Passively moved B LE's into hip flexion (seated) with VC for pt to control eccentric. MaxA/totalA required to obtain peak hip flexion  with pt noting to start to control eccentric closer to 90*  NMR performed for improvements in motor control and coordination, balance, sequencing, judgement, and self confidence/ efficacy in performing all aspects of mobility at highest level of independence.   Patient sitting EOB at end of session with brakes locked, sister present, and all needs within reach.      Therapy Documentation Precautions:  Precautions Precautions: Fall Recall of Precautions/Restrictions: Impaired Precaution/Restrictions Comments: MS with LB weakness Required Braces or Orthoses: Other Brace Other Brace: AFOs in room Restrictions Weight Bearing Restrictions Per Provider Order: No Other Position/Activity Restrictions: AFOs in room, no shoes  Therapy/Group: Individual Therapy  Bunny Kleist PTA 09/06/2023, 3:25 PM

## 2023-09-06 NOTE — IPOC Note (Signed)
 Overall Plan of Care [facility]) Patient Details Name: Johnathan Ross MRN: [id number] DOB: 19-Sep-1970  Admitting Diagnosis: Anoxic encephalopathy  Hospital Problems: Principal Problem:   Acute encephalopathy     Functional Problem List: Nursing Bladder, Bowel, Endurance, Safety, Medication Management  PT Balance, Edema, Endurance, Motor, Nutrition, Safety, Sensory  OT Balance, Safety, Behavior, Cognition, Sensory, Edema, Skin Integrity, Endurance, Motor  SLP Cognition, Safety  TR         Basic ADL's: OT Grooming, Dressing, Toileting, Bathing     Advanced  ADL's: OT Simple Meal Preparation, Laundry, Light Housekeeping     Transfers: PT Bed Mobility, Bed to Chair, Car, Lobbyist, Technical brewer: PT Ambulation, Psychologist, prison and probation services, Stairs     Additional Impairments: OT    SLP Social Cognition   Problem Solving, Memory, Attention, Awareness  TR      Anticipated Outcomes Item Anticipated Outcome  Self Feeding Mod I  Swallowing      Basic self-care  CGA-min A  Toileting  CGA   Bathroom Transfers min A  Bowel/Bladder  manage bowel w mod I assist  Transfers  supervision  Locomotion  supervision  Communication     Cognition  supervision  Pain  n/a  Safety/Judgment  manage safety w cues   Therapy Plan: PT Intensity: Minimum of 1-2 x/day ,45 to 90 minutes PT Frequency: 5 out of 7 days PT Duration Estimated Length of Stay: 7 days OT Intensity: Minimum of 1-2 x/day, 45 to 90 minutes OT Frequency: 5 out of 7 days OT Duration/Estimated Length of Stay: 1-1.5 weeks SLP Intensity: Minumum of 1-2 x/day, 30 to 90 minutes SLP Frequency: 1 to 3 out of 7 days SLP Duration/Estimated Length of Stay: ~2 weeks   Team Interventions: Nursing Interventions Patient/Family Education, Bowel Management, Disease Management/Prevention, Medication Management, Discharge Planning  PT interventions Ambulation/gait training, Cognitive remediation/compensation,  Discharge planning, DME/adaptive equipment instruction, Functional mobility training, Pain management, Psychosocial support, Splinting/orthotics, Therapeutic Activities, UE/LE Strength taining/ROM, Warden/ranger, Community reintegration, Disease management/prevention, Neuromuscular re-education, Patient/family education, Skin care/wound management, Stair training, Therapeutic Exercise, UE/LE Coordination activities, Wheelchair propulsion/positioning  OT Interventions Warden/ranger, Discharge planning, Functional electrical stimulation, Pain management, Self Care/advanced ADL retraining, Therapeutic Activities, UE/LE Coordination activities, Cognitive remediation/compensation, Disease mangement/prevention, Functional mobility training, Patient/family education, Skin care/wound managment, Therapeutic Exercise, Wheelchair propulsion/positioning, UE/LE Strength taining/ROM, Splinting/orthotics, Psychosocial support, Neuromuscular re-education, DME/adaptive equipment instruction, Community reintegration  SLP Interventions Cognitive remediation/compensation, Environmental controls, Financial trader, Functional tasks, Therapeutic Activities, Internal/external aids, Patient/family education  TR Interventions    SW/CM Interventions Discharge Planning, Psychosocial Support, Patient/Family Education   Barriers to Discharge MD  Medical stability  Nursing Decreased caregiver support 1 level 4 ste bil rails w S.O. and mother; mother drives to appointments and will stay when S.O. is at work  PT [employer] home environment, Community education officer for SNF coverage, Other (comments) (change in DM medication and monitoring)    OT      SLP      SW Decreased caregiver support, Community education officer for SNF coverage, Lack of/limited family support     Team Discharge Planning: Destination: PT-Home ,OT- Home , SLP-Home Projected Follow-up: PT-Outpatient PT, 24 hour supervision/assistance, OT-  Outpatient OT,  SLP-None Projected Equipment Needs: PT-To be determined, OT- To be determined, SLP-None recommended by SLP Equipment Details: PT- , OT-  Patient/family involved in discharge planning: PT- Patient, Family member/caregiver,  OT-Patient, SLP-Patient  MD ELOS: 7-10d  Medical Rehab Prognosis:  Good Assessment: The patient has been admitted  for CIR therapies with the diagnosis of anoxic encephalopathy . The team will be addressing functional mobility, strength, stamina, balance, safety, adaptive techniques and equipment, self-care, bowel and bladder mgt, patient and caregiver education, MS with chronic paraplegia, type 1 DM. Goals have been set at Mod I. Anticipated discharge destination is Home .        See Team Conference Notes for weekly updates to the plan of care

## 2023-09-06 NOTE — Progress Notes (Addendum)
 PROGRESS NOTE   Subjective/Complaints:  Appreciate diabetes RN note , pt concerned about dressing Left chest .  Steffanie Rainwater' worried about pm nurse wanting to give additional short acting insulin OT stating pt progressing well but PT needs to eval  Also plan for AFO consult   Review of systems: Denies chest pains, shortness of breath, nausea vomiting diarrhea constipation   Objective:   No results found. Recent Labs    09/03/23 2113 09/04/23 0507  WBC 15.5* 13.6*  HGB 15.0 13.8  HCT 44.8 41.1  PLT 283 253   Recent Labs    09/03/23 2113 09/04/23 0507  NA  --  133*  K  --  4.6  CL  --  98  CO2  --  24  GLUCOSE  --  151*  BUN  --  21*  CREATININE 1.24 1.14  CALCIUM  --  8.8*    Intake/Output Summary (Last 24 hours) at 09/06/2023 0739 Last data filed at 09/06/2023 0558 Gross per 24 hour  Intake 720 ml  Output 1075 ml  Net -355 ml        Physical Exam: Vital Signs Blood pressure (!) 100/50, pulse 62, temperature 97.9 F (36.6 C), resp. rate 17, height 6\' 1"  (1.854 m), weight 93.8 kg, SpO2 100%.  General: No acute distress Mood and affect are appropriate Heart: Regular rate and rhythm no rubs murmurs or extra sounds Lungs: Clear to auscultation, breathing unlabored, no rales or wheezes Abdomen: Positive bowel sounds, soft nontender to palpation, nondistended Extremities: No clubbing, cyanosis, or edema Skin: Stasis dermatitis BLE,  steristrips over ICD site, no bleeding, no erythema around dressing  Neurologic: Cranial nerves II through XII intact, motor strength is 5/5 in bilateral deltoid, bicep, tricep, grip, 2-hip flexor and abd/add , 3-knee extensors, traceankle dorsiflexor and plantar flexor Sensory exam normal sensation to light touch and proprioception in bilateral upper and lower extremities Cerebellar no dysmetria in UEs  Musculoskeletal: Full range of motion in all 4 extremities. No joint swelling,  no tenderness Left chest no pain with shoulder flexion and abd    Assessment/Plan: 1. Functional deficits which require 3+ hours per day of interdisciplinary therapy in a comprehensive inpatient rehab setting. Physiatrist is providing close team supervision and 24 hour management of active medical problems listed below. Physiatrist and rehab team continue to assess barriers to discharge/monitor patient progress toward functional and medical goals  Care Tool:  Bathing    Body parts bathed by patient: Right arm, Left arm, Abdomen, Chest, Front perineal area, Right upper leg, Left upper leg, Face, Left lower leg, Right lower leg   Body parts bathed by helper: Buttocks     Bathing assist Assist Level: Minimal Assistance - Patient > 75% (simulated EOB)     Upper Body Dressing/Undressing Upper body dressing   What is the patient wearing?: Pull over shirt    Upper body assist Assist Level: Set up assist    Lower Body Dressing/Undressing Lower body dressing      What is the patient wearing?: Pants     Lower body assist Assist for lower body dressing: Minimal Assistance - Patient > 75% (completing lateral leans)  Toileting Toileting    Toileting assist Assist for toileting: Moderate Assistance - Patient 50 - 74%     Transfers Chair/bed transfer  Transfers assist     Chair/bed transfer assist level: Minimal Assistance - Patient > 75%     Locomotion Ambulation   Ambulation assist      Assist level: Minimal Assistance - Patient > 75% Assistive device: Walker-rolling Max distance: 25 ft   Walk 10 feet activity   Assist     Assist level: Minimal Assistance - Patient > 75% Assistive device: Walker-rolling   Walk 50 feet activity   Assist Walk 50 feet with 2 turns activity did not occur: Safety/medical concerns         Walk 150 feet activity   Assist Walk 150 feet activity did not occur: Safety/medical concerns         Walk 10 feet on  uneven surface  activity   Assist Walk 10 feet on uneven surfaces activity did not occur: Safety/medical concerns         Wheelchair     Assist Is the patient using a wheelchair?: Yes Type of Wheelchair: Manual    Wheelchair assist level: Supervision/Verbal cueing, Contact Guard/Touching assist Max wheelchair distance: 75 ft    Wheelchair 50 feet with 2 turns activity    Assist        Assist Level: Minimal Assistance - Patient > 75%, Contact Guard/Touching assist   Wheelchair 150 feet activity     Assist      Assist Level: Moderate Assistance - Patient 50 - 74%   Blood pressure (!) 100/50, pulse 62, temperature 97.9 F (36.6 C), resp. rate 17, height 6\' 1"  (1.854 m), weight 93.8 kg, SpO2 100%.  Medical Problem List and Plan: 1. Functional deficits secondary to cardiac arrest/V-fib/V. tach with anoxic encephalopathy             -patient may shower             -ELOS/Goals:should be ready 4/14 or 4/15 will need AFO consult , repeat labwork, transition from lantus /novalog to insulin pump  supervision to min assist goals with PT, OT, SLP             -pt has a pair of ipsilon AFO's. Given his profound ankle weakness in addition to his weakness proximally, he needs a more supportive AFO (Blue Rocker?). PT and orthotist to assess.  2.  Antithrombotics: -DVT/anticoagulation:  Pharmaceutical: Heparin             -antiplatelet therapy: N/A 3. Pain Management: Tylenol as needed 4. Mood/Behavior/Sleep: Provide emotional support             -antipsychotic agents: N/A 5. Neuropsych/cognition: This patient is not capable of making decisions on his own behalf. 6. Skin/Wound Care: Routine skin checks-ICD dressing removed , 2 layer steristrips remain to flake off in shower  7. Fluids/Electrolytes/Nutrition: Routine in and outs with follow-up chemistries             -pt appears to have good appetite 8.  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 Chronic LE edema  Status post ICD 08/30/2023.   -continue Amiodarone 400 mg daily, Toprol-XL 25 mg daily, Entresto 49-51 mg twice daily, Aldactone 25 mg daily 9.  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  Latest hemoglobin A1c 7.3.  Now on insulin pump  10.  Hypothyroidism.  Synthroid 11.  History of multiple sclerosis with paraparesis as well as  truncal weakness.  Followed by neurology services Dr. Epimenio Foot..  Continue Dalfampridine 10 mg twice daily as well as Requip for restless legs             -pt states that restless legs have been better while in hospital May need orthotic management while at the hospital.  However has been noncompliant with his current orthoses. 12.  AKI on CKD stage III.    Latest Ref Rng & Units 09/04/2023    5:07 AM 09/03/2023    9:13 PM 09/02/2023    5:20 AM  BMP  Glucose 70 - 99 mg/dL 191   478   BUN 6 - 20 mg/dL 21   19   Creatinine 2.95 - 1.24 mg/dL 6.21  3.08  6.57   Sodium 135 - 145 mmol/L 133   137   Potassium 3.5 - 5.1 mmol/L 4.6   4.1   Chloride 98 - 111 mmol/L 98   99   CO2 22 - 32 mmol/L 24   24   Calcium 8.9 - 10.3 mg/dL 8.8   8.7    13.  Elevated transaminase on 4/9 after normalizing 5 d ago repeat 4/14    LOS: 3 days A FACE TO FACE EVALUATION WAS PERFORMED  Erick Colace 09/06/2023, 7:39 AM

## 2023-09-06 NOTE — Progress Notes (Signed)
 Orthopedic Tech Progress Note Patient Details:  Johnathan Ross 1971/05/05 [id number] Called [facility] for BIL AFO consult Patient ID: Johnathan Ross, male   DOB: June 16, 1970, 53 y.o.   MRN: [id number]  Johnathan Ross 09/06/2023, 9:19 AM

## 2023-09-07 DIAGNOSIS — G9340 Encephalopathy, unspecified: Secondary | ICD-10-CM | POA: Diagnosis not present

## 2023-09-07 MED ORDER — METOPROLOL SUCCINATE ER 25 MG PO TB24
12.5000 mg | ORAL_TABLET | Freq: Every day | ORAL | Status: DC
  Administered 2023-09-08 – 2023-09-11 (×3): 12.5 mg via ORAL
  Filled 2023-09-07 (×4): qty 1

## 2023-09-07 MED ORDER — VITAMIN D 25 MCG (1000 UNIT) PO TABS
1000.0000 [IU] | ORAL_TABLET | Freq: Every day | ORAL | Status: DC
  Administered 2023-09-07 – 2023-09-11 (×5): 1000 [IU] via ORAL
  Filled 2023-09-07 (×5): qty 1

## 2023-09-07 NOTE — Progress Notes (Signed)
 Occupational Therapy Session Note  Patient Details  Name: HARSHAL SIRMON MRN: [id number] Date of Birth: 1970-12-29  Today's Date: 09/07/2023 OT Individual Time: 1050-1200 OT Individual Time Calculation (min): 70 min    Short Term Goals: Week 1:  OT Short Term Goal 1 (Week 1): Pt will complete toilet transfers with min A consistently using LRAD OT Short Term Goal 2 (Week 1): Pt will complete 3/3 toileting tasks min A using LRAD and AE PRN OT Short Term Goal 3 (Week 1): Pt will maintain dynamic standing balance during BADLs and functional tasks min A using LRAD OT Short Term Goal 4 (Week 1): Pt will tolerate standing >2 minutes during BADLs or functional tasks using LRAD  Skilled Therapeutic Interventions/Progress Updates:  Skilled OT intervention completed with focus on DC planning, DME education, functional mobility, BUE endurance. Pt received seated EOB, agreeable to session. No pain reported.  Pt declined self-care needs. Min A needed to donn R AFO and shoe with pt able to self lace without assist. CGA sit > stand and stand pivot with RW > w/c with heavy reliance on RW demonstrated to manage BLE. Pt self-propelled in w/c > ADL apartment.  Pt initially slightly resistant to OT suggestions with mobility and DC plans including DME recommendations but with gentle encouragement and efforts to give pt autonomy and self-efficacy, pt was slightly more receptive.   Pt reported he normally steps into tub shower, does not have grab bars, stands to shower and sits on edge of tub ledge for washing BLE. States he has chair (unsure if shower chair or bench and does not prefer to have girlfriend look in storage to see which it is because he "wont use it"). Initially had pt stand with CGA using RW, then ambulate with CGA using RW > tub ledge with plan for pt to trial stepping over, however pt with verbalized fatigue in BLE anticipating too much of a challenge. Discussed use of TTB for increasing safety when  legs are "too tired" and for avoidance of sitting on tub ledge, however anticipate this is safest method for pt at all times due to BLE GM coordination, balance and strength deficits especially without bilateral AFOs. Retrieved TTB, and pt was able to transfer in/out of tub with supervision though increased time and reliance on using AFO strap to lift leg on 1 occurrence. Ambulated with CGA back to w/c. Handout issued for TTB if pt not receptive to order one here.   In kitchen, pt demonstrated ability to reach into various cabinet heights, open/close fridge door, all at the CGA level with use of RW. Cues were needed for body positioning and fall prevention. Therapist retrieved walker tray for use with gathering items, with education provided about benefits, purpose and use of the device for increasing independence with IADLs at home. Handout issued to pt for purchase for safety with meals vs "tossing" items from fridge to counter/table.  Transported dependently in w/c > gym. Pt completed the following intervals while seated at the UE Nustep utilizing pace partner to maintain RPMs to promote global/BUE endurance needed for independence with BADLs and functional transfers: -12 min, level 2, forwards  Back in room pt stood and stand pivot with CGA using RW, doffed bilateral shoes with min A. Pt remained seated EOB, with bed alarm on/activated, and with all needs in reach at end of session.   Therapy Documentation Precautions:  Precautions Precautions: Fall Recall of Precautions/Restrictions: Impaired Precaution/Restrictions Comments: MS with LB weakness Required Braces or  Orthoses: Other Brace Other Brace: AFOs in room Restrictions Weight Bearing Restrictions Per Provider Order: No Other Position/Activity Restrictions: AFOs in room, no shoes    Therapy/Group: Individual Therapy  Ruthanna Covert, MS, OTR/L  09/07/2023, 12:10 PM

## 2023-09-07 NOTE — Progress Notes (Signed)
 PROGRESS NOTE   Subjective/Complaints: No new complaints this morning MEWs 1 for hypotension, will reduce lopressor temporarily to 12.5mg  until this improves  Review of systems: Denies chest pains, shortness of breath, nausea vomiting diarrhea constipation   Objective:   No results found. No results for input(s): "WBC", "HGB", "HCT", "PLT" in the last 72 hours.  No results for input(s): "NA", "K", "CL", "CO2", "GLUCOSE", "BUN", "CREATININE", "CALCIUM" in the last 72 hours.   Intake/Output Summary (Last 24 hours) at 09/07/2023 1446 Last data filed at 09/07/2023 1434 Gross per 24 hour  Intake 717 ml  Output 1275 ml  Net -558 ml        Physical Exam: Vital Signs Blood pressure (!) 94/59, pulse 69, temperature (!) 97.5 F (36.4 C), resp. rate 18, height 6\' 1"  (1.854 m), weight 94 kg, SpO2 99%.  General: No acute distress Mood and affect are appropriate Heart: Regular rate and rhythm no rubs murmurs or extra sounds Lungs: Clear to auscultation, breathing unlabored, no rales or wheezes Abdomen: Positive bowel sounds, soft nontender to palpation, nondistended Extremities: No clubbing, cyanosis, or edema Skin: Stasis dermatitis BLE,  steristrips over ICD site, no bleeding, no erythema around dressing  Neurologic: Cranial nerves II through XII intact, motor strength is 5/5 in bilateral deltoid, bicep, tricep, grip, 2-hip flexor and abd/add , 3-knee extensors, traceankle dorsiflexor and plantar flexor Sensory exam normal sensation to light touch and proprioception in bilateral upper and lower extremities Cerebellar no dysmetria in UEs  Musculoskeletal: Full range of motion in all 4 extremities. No joint swelling, no tenderness Left chest no pain with shoulder flexion and abd, stable 4/12    Assessment/Plan: 1. Functional deficits which require 3+ hours per day of interdisciplinary therapy in a comprehensive inpatient rehab  setting. Physiatrist is providing close team supervision and 24 hour management of active medical problems listed below. Physiatrist and rehab team continue to assess barriers to discharge/monitor patient progress toward functional and medical goals  Care Tool:  Bathing    Body parts bathed by patient: Right arm, Left arm, Abdomen, Chest, Front perineal area, Right upper leg, Left upper leg, Face, Left lower leg, Right lower leg   Body parts bathed by helper: Buttocks     Bathing assist Assist Level: Minimal Assistance - Patient > 75% (simulated EOB)     Upper Body Dressing/Undressing Upper body dressing   What is the patient wearing?: Pull over shirt    Upper body assist Assist Level: Set up assist    Lower Body Dressing/Undressing Lower body dressing      What is the patient wearing?: Pants     Lower body assist Assist for lower body dressing: Minimal Assistance - Patient > 75% (completing lateral leans)     Toileting Toileting    Toileting assist Assist for toileting: Moderate Assistance - Patient 50 - 74%     Transfers Chair/bed transfer  Transfers assist     Chair/bed transfer assist level: Minimal Assistance - Patient > 75%     Locomotion Ambulation   Ambulation assist      Assist level: Minimal Assistance - Patient > 75% Assistive device: Walker-rolling Max distance: 25 ft  Walk 10 feet activity   Assist     Assist level: Minimal Assistance - Patient > 75% Assistive device: Walker-rolling   Walk 50 feet activity   Assist Walk 50 feet with 2 turns activity did not occur: Safety/medical concerns         Walk 150 feet activity   Assist Walk 150 feet activity did not occur: Safety/medical concerns         Walk 10 feet on uneven surface  activity   Assist Walk 10 feet on uneven surfaces activity did not occur: Safety/medical concerns         Wheelchair     Assist Is the patient using a wheelchair?: Yes Type of  Wheelchair: Manual    Wheelchair assist level: Supervision/Verbal cueing, Contact Guard/Touching assist Max wheelchair distance: 75 ft    Wheelchair 50 feet with 2 turns activity    Assist        Assist Level: Minimal Assistance - Patient > 75%, Contact Guard/Touching assist   Wheelchair 150 feet activity     Assist      Assist Level: Moderate Assistance - Patient 50 - 74%   Blood pressure (!) 94/59, pulse 69, temperature (!) 97.5 F (36.4 C), resp. rate 18, height 6\' 1"  (1.854 m), weight 94 kg, SpO2 99%.  Medical Problem List and Plan: 1. Functional deficits secondary to cardiac arrest/V-fib/V. tach with anoxic encephalopathy             -patient may shower             -ELOS/Goals:should be ready 4/14 or 4/15 will need AFO consult , repeat labwork, transition from lantus /novalog to insulin pump  supervision to min assist goals with PT, OT, SLP             -pt has a pair of ipsilon AFO's. Given his profound ankle weakness in addition to his weakness proximally, he needs a more supportive AFO (Blue Rocker?). PT and orthotist to assess.  2.  Antithrombotics: -DVT/anticoagulation:  Pharmaceutical: Heparin             -antiplatelet therapy: N/A 3. Pain Management: Tylenol as needed 4. Mood/Behavior/Sleep: Provide emotional support             -antipsychotic agents: N/A 5. Neuropsych/cognition: This patient is not capable of making decisions on his own behalf. 6. Skin/Wound Care: Routine skin checks-ICD dressing removed , 2 layer steristrips remain to flake off in shower  7. Fluids/Electrolytes/Nutrition: Routine in and outs with follow-up chemistries             -pt appears to have good appetite 8.  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 Chronic LE edema  Status post ICD 08/30/2023.   -continue Amiodarone 400 mg daily, Toprol-XL 25 mg daily, Entresto 49-51 mg twice daily, Aldactone 25 mg daily 9.  Type 1 diabetes mellitus with  proliferative diabetic retinopathy.  Latest hemoglobin A1c 7.3.  Now on insulin pump   10.  Hypothyroidism.  Continue Synthroid  11.  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  Followed by neurology services Dr. Godwin Lat..  Continue Dalfampridine 10 mg twice daily as well as Requip for restless legs             -pt states that restless legs have been better while in hospital May need orthotic management while at the hospital.  However has been noncompliant with his current orthoses.  -D3 supplement started  12.  AKI on CKD stage III. Monitor creatinine weekly    Latest Ref Rng & Units 09/04/2023    5:07 AM 09/03/2023    9:13 PM 09/02/2023    5:20 AM  BMP  Glucose 70 - 99 mg/dL 161   096   BUN 6 - 20 mg/dL 21   19   Creatinine 0.45 - 1.24 mg/dL 4.09  8.11  9.14   Sodium 135 - 145 mmol/L 133   137   Potassium 3.5 - 5.1 mmol/L 4.6   4.1   Chloride 98 - 111 mmol/L 98   99   CO2 22 - 32 mmol/L 24   24   Calcium 8.9 - 10.3 mg/dL 8.8   8.7    13.  Elevated transaminase on 4/9 after normalizing 5 d ago repeat 4/14  14. Hypotension: MEWS 1, will decrease lopressor to 12.5mg  temporarily    LOS: 4 days A FACE TO FACE EVALUATION WAS PERFORMED  Johnathan Ross 09/07/2023, 2:46 PM

## 2023-09-07 NOTE — Progress Notes (Signed)
 Physical Therapy Session Note  Patient Details  Name: Johnathan Ross MRN: [id number] Date of Birth: 1970/09/02  Today's Date: 09/07/2023 PT Individual Time: 0805-0900; 1412 - 1510 PT Individual Time Calculation (min): 55 min; 58 min   Short Term Goals: Week 1:  PT Short Term Goal 1 (Week 1): STG = LTG d/t ELOS  SESSION 1 Skilled Therapeutic Interventions/Progress Updates: Patient sitting EOB with significant other on entrance to room. Patient alert and agreeable to PT session.   Patient reported unrated pain on pacemaker site when trying to cough (medical team aware). PTA conversed with pt's significant other briefly about pt's current goals, and potential medication and what therapy has consisted of lately. Pt reported that he got out of bed last night and ambulated to sink to brush teeth with no AD (reported using hand surfing method with railing at end of bed to reach wall by sink). PTA discouraged this and stated to call nursing to assist with getting OOB due to pt's fall risk. Pt also further educated that pt will have to use RW when at home vs hand surfing as pt stated he did prior. Pt informed that has decreased strength due to debility from hospital stay and MS, and that hand surfing increases risk of fall with pt understanding.   Therapeutic Activity: Transfers: Pt performed sit<>stand pivot transfer in RW multiple reps from WC<>mat with VC for weight shifting and increase step length. Pt close supervision throughout with B knees locking up in extension occasionally. Pt performed same transfer back in room at end of session WC<EOB with same assistance.   Gait Training:  Pt ambulated 75' using RW with close supervision. Pt hip hiking B LE (R>L) with cues to bring knees up to front of RW bar to decrease hip compensation, and increase hip flexor activation. Hyperextension noted with pt aware and making adjustments to maintain "soft bend" learned from previous PT prior to admission. Pt  with step to pattern at first, then stepped through with improved cadence and heel strike. Pt provided with added cue to weight shift accordingly.    Therapeutic Exercise: Pt performed the following exercises with therapist providing the described cuing and facilitation for improvement. - B hip flexion. Passively moved into peak flexion sitting in WC with cues for pt to control eccentric (noted increase in pt's ability to elevate B heels off of floor this session vs previous sessions with this PTA). 2 x 6 with added cues to breathe (performed prior to 1st ambulatory trial).  - Sitting edge of mat B knee flexion with yellow theraband (adjusted throughout to personalize resistance pt able to tolerate). Pt cued to bring knee past 90* of available AROM with PTA pulling into extension (3-5s isometric holds) with cues to control eccentric. 2 x 6 with rest break between sets.  Patient sitting EOB at end of session with brakes locked, sister present, bed alarm set, and all needs within reach.  SESSION 2 Skilled Therapeutic Interventions/Progress Updates: Patient sitting EOB with mother present on entrance to room. Patient alert and agreeable to PT session.   Patient reported no pain during session (only when raising L UE overhead due to pacemaker with pt acknowledging to avoid overhead reaching if it induces pain).  Therapeutic Activity: Transfers: Pt performed sit<>stand pivot transfers throughout session with CGA for safety. Provided VC for weight shift with pt presenting with improved pivot stepping this session.  - Pt void bladder end of session (stood from [facility]) with overall CGA for safety.  Gait Training:  Pt ambulated 101' using RW with CGA/close supervision. Pt cued to bring R knee up to front of bar to decrease hip hike (also cued to flex at knee during swing phase with noted improvement in decreased hip hike and pt reporting feeling like a normal walk). Pt made 4 90* with improved weight shift  and turning steps. At end of this trial, pt pivoted 360* with continued improvement in ability to do so without dragging feet on floor, and to instead shift weight accordingly to pivot. Pt ambulated roughly 32' in N hallway at end of session. Pt required increased time and effort to advance B LE's due to fatigue. Pt required seated rest break and was transported back to room in [facility].   Therapeutic Exercise: Pt performed the following exercises with therapist providing the described cuing and facilitation for improvement. - Attempted to step to 3" step with B UE support with pt cued to bring R LE towards theraband as external target to focus on using hip flexors vs hip hike. Pt unable to perform to that height.  - Pt in // bars stepping to sliding board B LE's with hip hiking still noted. Pt then ambulated anteriorly/posteriorly in bars with B UE support with pt decreasing hip compensation about 25% of time. Pt required VC to shift weight accordingly. Retro-steps, pt with step through pattern (also with anterior) and LE locked in extension.   Patient sitting in WC at end of session with brakes locked, family present (about to go outside with grounds pass) and all needs within reach.       Therapy Documentation Precautions:  Precautions Precautions: Fall Recall of Precautions/Restrictions: Impaired Precaution/Restrictions Comments: MS with LB weakness Required Braces or Orthoses: Other Brace Other Brace: AFOs in room Restrictions Weight Bearing Restrictions Per Provider Order: No Other Position/Activity Restrictions: AFOs in room, no shoes  Therapy/Group: Individual Therapy  Mahmood Boehringer PTA 09/07/2023, 9:04 AM

## 2023-09-08 DIAGNOSIS — G9340 Encephalopathy, unspecified: Secondary | ICD-10-CM | POA: Diagnosis not present

## 2023-09-08 NOTE — Progress Notes (Signed)
 PROGRESS NOTE   Subjective/Complaints: No new complaints this morning Bp still soft Patient's chart reviewed- No issues reported overnight  Review of systems: Denies chest pains, shortness of breath, nausea vomiting diarrhea constipation   Objective:   No results found. No results for input(s): "WBC", "HGB", "HCT", "PLT" in the last 72 hours.  No results for input(s): "NA", "K", "CL", "CO2", "GLUCOSE", "BUN", "CREATININE", "CALCIUM" in the last 72 hours.   Intake/Output Summary (Last 24 hours) at 09/08/2023 1559 Last data filed at 09/08/2023 1300 Gross per 24 hour  Intake 236 ml  Output 500 ml  Net -264 ml        Physical Exam: Vital Signs Blood pressure 99/62, pulse 73, temperature 98.4 F (36.9 C), temperature source Oral, resp. rate 17, height 6\' 1"  (1.854 m), weight 94 kg, SpO2 100%.  General: No acute distress Mood and affect are appropriate Heart: Regular rate and rhythm no rubs murmurs or extra sounds Lungs: Clear to auscultation, breathing unlabored, no rales or wheezes Abdomen: Positive bowel sounds, soft nontender to palpation, nondistended Extremities: No clubbing, cyanosis, or edema Skin: Stasis dermatitis BLE,  steristrips over ICD site, no bleeding, no erythema around dressing  Neurologic: Cranial nerves II through XII intact, motor strength is 5/5 in bilateral deltoid, bicep, tricep, grip, 2-hip flexor and abd/add , 3-knee extensors, traceankle dorsiflexor and plantar flexor Sensory exam normal sensation to light touch and proprioception in bilateral upper and lower extremities Cerebellar no dysmetria in UEs  Musculoskeletal: Full range of motion in all 4 extremities. No joint swelling, no tenderness Left chest no pain with shoulder flexion and abd, stable 4/13    Assessment/Plan: 1. Functional deficits which require 3+ hours per day of interdisciplinary therapy in a comprehensive inpatient rehab  setting. Physiatrist is providing close team supervision and 24 hour management of active medical problems listed below. Physiatrist and rehab team continue to assess barriers to discharge/monitor patient progress toward functional and medical goals  Care Tool:  Bathing    Body parts bathed by patient: Right arm, Left arm, Abdomen, Chest, Front perineal area, Right upper leg, Left upper leg, Face, Left lower leg, Right lower leg   Body parts bathed by helper: Buttocks     Bathing assist Assist Level: Minimal Assistance - Patient > 75% (simulated EOB)     Upper Body Dressing/Undressing Upper body dressing   What is the patient wearing?: Pull over shirt    Upper body assist Assist Level: Set up assist    Lower Body Dressing/Undressing Lower body dressing      What is the patient wearing?: Pants     Lower body assist Assist for lower body dressing: Minimal Assistance - Patient > 75% (completing lateral leans)     Toileting Toileting    Toileting assist Assist for toileting: Moderate Assistance - Patient 50 - 74%     Transfers Chair/bed transfer  Transfers assist     Chair/bed transfer assist level: Minimal Assistance - Patient > 75%     Locomotion Ambulation   Ambulation assist      Assist level: Minimal Assistance - Patient > 75% Assistive device: Walker-rolling Max distance: 25 ft   Walk 10  feet activity   Assist     Assist level: Minimal Assistance - Patient > 75% Assistive device: Walker-rolling   Walk 50 feet activity   Assist Walk 50 feet with 2 turns activity did not occur: Safety/medical concerns         Walk 150 feet activity   Assist Walk 150 feet activity did not occur: Safety/medical concerns         Walk 10 feet on uneven surface  activity   Assist Walk 10 feet on uneven surfaces activity did not occur: Safety/medical concerns         Wheelchair     Assist Is the patient using a wheelchair?: Yes Type of  Wheelchair: Manual    Wheelchair assist level: Supervision/Verbal cueing, Contact Guard/Touching assist Max wheelchair distance: 75 ft    Wheelchair 50 feet with 2 turns activity    Assist        Assist Level: Minimal Assistance - Patient > 75%, Contact Guard/Touching assist   Wheelchair 150 feet activity     Assist      Assist Level: Moderate Assistance - Patient 50 - 74%   Blood pressure 99/62, pulse 73, temperature 98.4 F (36.9 C), temperature source Oral, resp. rate 17, height 6\' 1"  (1.854 m), weight 94 kg, SpO2 100%.  Medical Problem List and Plan: 1. Functional deficits secondary to cardiac arrest/V-fib/V. tach with anoxic encephalopathy             -patient may shower             -ELOS/Goals:should be ready 4/14 or 4/15 will need AFO consult , repeat labwork, transition from lantus /novalog to insulin pump  supervision to min assist goals with PT, OT, SLP             -pt has a pair of ipsilon AFO's. Given his profound ankle weakness in addition to his weakness proximally, he needs a more supportive AFO ([place]?). PT and orthotist to assess.  2.  Antithrombotics: -DVT/anticoagulation:  Pharmaceutical: Heparin             -antiplatelet therapy: N/A 3. Pain Management: Tylenol as needed 4. Mood/Behavior/Sleep: Provide emotional support             -antipsychotic agents: N/A 5. Neuropsych/cognition: This patient is not capable of making decisions on his own behalf. 6. Skin/Wound Care: Routine skin checks-ICD dressing removed , 2 layer steristrips remain to flake off in shower  7. Fluids/Electrolytes/Nutrition: Routine in and outs with follow-up chemistries             -pt appears to have good appetite 8.  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 Chronic LE edema  Status post ICD 08/30/2023.   -continue Amiodarone 400 mg daily, Toprol-XL 25 mg daily, Entresto 49-51 mg twice daily, Aldactone 25 mg daily 9.  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  Latest hemoglobin A1c 7.3. continue insulin pump   10.  Hypothyroidism.  continue Synthroid  11.  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  Followed by neurology services Dr. Godwin Lat..  Continue Dalfampridine 10 mg twice daily as well as Requip for restless legs             -pt states that restless legs have been better while in hospital May need orthotic management while at the hospital.  However has been noncompliant with his current orthoses.  -D3 supplement started, continue  12.  AKI  on CKD stage III. Monitor creatinine weekly    Latest Ref Rng & Units 09/04/2023    5:07 AM 09/03/2023    9:13 PM 09/02/2023    5:20 AM  BMP  Glucose 70 - 99 mg/dL 161   096   BUN 6 - 20 mg/dL 21   19   Creatinine 0.45 - 1.24 mg/dL 4.09  8.11  9.14   Sodium 135 - 145 mmol/L 133   137   Potassium 3.5 - 5.1 mmol/L 4.6   4.1   Chloride 98 - 111 mmol/L 98   99   CO2 22 - 32 mmol/L 24   24   Calcium 8.9 - 10.3 mg/dL 8.8   8.7    13.  Elevated transaminase on 4/9 after normalizing 5 d ago repeat 4/14  14. Hypotension: decrease lopressor to 12.5mg  temporarily, improved, continue this dose    LOS: 5 days A FACE TO FACE EVALUATION WAS PERFORMED  Johnathan Ross 09/08/2023, 3:59 PM

## 2023-09-08 NOTE — Plan of Care (Signed)
  Problem: Consults Goal: RH GENERAL PATIENT EDUCATION Description: See Patient Education module for education specifics. Outcome: Progressing   Problem: RH BOWEL ELIMINATION Goal: RH STG MANAGE BOWEL WITH ASSISTANCE Description: STG Manage Bowel with mod I Assistance. Outcome: Progressing Goal: RH STG MANAGE BOWEL W/MEDICATION W/ASSISTANCE Description: STG Manage Bowel with Medication with mod I Assistance. Outcome: Progressing   Problem: RH SAFETY Goal: RH STG ADHERE TO SAFETY PRECAUTIONS W/ASSISTANCE/DEVICE Description: STG Adhere to Safety Precautions With cues Assistance/Device. Outcome: Progressing   Problem: RH KNOWLEDGE DEFICIT GENERAL Goal: RH STG INCREASE KNOWLEDGE OF SELF CARE AFTER HOSPITALIZATION Description: Patient and family will be able to manage care at discharge using educational resources for medications and dietary modifications independently Outcome: Progressing   Problem: Education: Goal: Ability to describe self-care measures that may prevent or decrease complications (Diabetes Survival Skills Education) will improve Outcome: Progressing Goal: Individualized Educational Video(s) Outcome: Progressing   Problem: Coping: Goal: Ability to adjust to condition or change in health will improve Outcome: Progressing   Problem: Fluid Volume: Goal: Ability to maintain a balanced intake and output will improve Outcome: Progressing   Problem: Health Behavior/Discharge Planning: Goal: Ability to identify and utilize available resources and services will improve Outcome: Progressing Goal: Ability to manage health-related needs will improve Outcome: Progressing   Problem: Metabolic: Goal: Ability to maintain appropriate glucose levels will improve Outcome: Progressing   Problem: Nutritional: Goal: Maintenance of adequate nutrition will improve Outcome: Progressing Goal: Progress toward achieving an optimal weight will improve Outcome: Progressing    Problem: Skin Integrity: Goal: Risk for impaired skin integrity will decrease Outcome: Progressing   Problem: Tissue Perfusion: Goal: Adequacy of tissue perfusion will improve Outcome: Progressing

## 2023-09-09 DIAGNOSIS — I5021 Acute systolic (congestive) heart failure: Secondary | ICD-10-CM | POA: Diagnosis not present

## 2023-09-09 DIAGNOSIS — G9340 Encephalopathy, unspecified: Secondary | ICD-10-CM | POA: Diagnosis not present

## 2023-09-09 DIAGNOSIS — R41840 Attention and concentration deficit: Secondary | ICD-10-CM | POA: Diagnosis not present

## 2023-09-09 LAB — BASIC METABOLIC PANEL WITH GFR
Anion gap: 8 (ref 5–15)
BUN: 29 mg/dL — ABNORMAL HIGH (ref 6–20)
CO2: 28 mmol/L (ref 22–32)
Calcium: 9.4 mg/dL (ref 8.9–10.3)
Chloride: 97 mmol/L — ABNORMAL LOW (ref 98–111)
Creatinine, Ser: 1.68 mg/dL — ABNORMAL HIGH (ref 0.61–1.24)
GFR, Estimated: 49 mL/min — ABNORMAL LOW (ref >60)
Glucose, Bld: 90 mg/dL (ref 70–99)
Potassium: 4.9 mmol/L (ref 3.5–5.1)
Sodium: 133 mmol/L — ABNORMAL LOW (ref 135–145)

## 2023-09-09 LAB — COMPREHENSIVE METABOLIC PANEL WITH GFR
ALT: 82 U/L — ABNORMAL HIGH (ref 0–44)
AST: 50 U/L — ABNORMAL HIGH (ref 15–41)
Albumin: 3.4 g/dL — ABNORMAL LOW (ref 3.5–5.0)
Alkaline Phosphatase: 93 U/L (ref 38–126)
Anion gap: 7 (ref 5–15)
BUN: 34 mg/dL — ABNORMAL HIGH (ref 6–20)
CO2: 27 mmol/L (ref 22–32)
Calcium: 9 mg/dL (ref 8.9–10.3)
Chloride: 98 mmol/L (ref 98–111)
Creatinine, Ser: 1.75 mg/dL — ABNORMAL HIGH (ref 0.61–1.24)
GFR, Estimated: 47 mL/min — ABNORMAL LOW (ref >60)
Glucose, Bld: 179 mg/dL — ABNORMAL HIGH (ref 70–99)
Potassium: 5.5 mmol/L — ABNORMAL HIGH (ref 3.5–5.1)
Sodium: 132 mmol/L — ABNORMAL LOW (ref 135–145)
Total Bilirubin: 0.8 mg/dL (ref 0.0–1.2)
Total Protein: 6.4 g/dL — ABNORMAL LOW (ref 6.5–8.1)

## 2023-09-09 LAB — MAGNESIUM: Magnesium: 2 mg/dL (ref 1.7–2.4)

## 2023-09-09 MED ORDER — ASPIRIN 81 MG PO TBEC
81.0000 mg | DELAYED_RELEASE_TABLET | Freq: Every day | ORAL | Status: DC
  Administered 2023-09-09 – 2023-09-11 (×3): 81 mg via ORAL
  Filled 2023-09-09 (×3): qty 1

## 2023-09-09 MED ORDER — SODIUM CHLORIDE 0.9 % IV BOLUS
250.0000 mL | Freq: Once | INTRAVENOUS | Status: AC
  Administered 2023-09-09: 250 mL via INTRAVENOUS

## 2023-09-09 NOTE — Progress Notes (Signed)
 Physical Therapy Session Note  Patient Details  Name: Johnathan Ross MRN: [id number] Date of Birth: 02-Jul-1970  Today's Date: 09/09/2023 PT Individual Time: 2952-8413 PT Individual Time Calculation (min): 43 min   Short Term Goals: Week 1:  PT Short Term Goal 1 (Week 1): STG = LTG d/t ELOS  Skilled Therapeutic Interventions/Progress Updates:      Pt seated in WC upon arrival. Pt agreeable to therapy. Pt denies any pain.   Pt requesting to use bathroom. Pt performed sit to stand with RW and CGA, verbal cues provided to lock brakes prior to standing Pt ambulated room to bathroom with RW and CGA, verbal cues provided for hip flexion versus hip hike. Pt stood with RW and continent of bladder with distant supervision. Pt performed ambuatory transfer to [facility] with CGA, with verbal cues for safety as pt left leg rests on WC, and stepping R LE outside of leg rests. Therapist recommended pt remove leg rests prior to standing to reduce fall risk/trip hazard for pt overall safety.   Pt ambulated room to main gym with RW and CGA, verbal cues provided for lateral weight shift and hip flexion versus hip hike.   Pt ascended/descended 2x4 6 inch steps with B HR and CGA/min A for safety, pt had difficulty recalling technique used for home. On first trial-pt ascended with R UE first using L UE to assist R foot onto step, and then using combination of L LE hip circumduction and hip hike for L LE advancement. Pt demos frequent L knee hyperextension and intermittent lateral LOB. On 2nd trial, pt attempted ascend with use of UE for advancement--how pt demos increased LOB with attempt to use UE to assist L LE onto step so discontinued. Pt  overall had difficulty recalling strategy he used for stair navigation at home prior to hospital stay and attempted lateral step technique-however discontinued for safety. Pt demos the most stability with the first technique; however therapist concenred about pt using this method at  home as pt reports he will not have someone consistently helping him with stair navigation at home.   Therapist introduced stair navigation with shower chair technique as a potential alternative 2/2 progressive nature of MS and with fluctuations in fatigue. Pt seemed open to the idea, but reports with increase in fatigue he sits on his bottom and bumps himself up/down the stairs. Did not trial either method during session 2/2 time constraints but would be beneficial to trail prior to D/C. Will defer to primary therapist.   Pt seated in [facility] with all needs within reach and seatbelt alarm on.       Therapy Documentation Precautions:  Precautions Precautions: Fall Recall of Precautions/Restrictions: Impaired Precaution/Restrictions Comments: MS with LB weakness Required Braces or Orthoses: Other Brace Other Brace: AFOs in room Restrictions Weight Bearing Restrictions Per Provider Order: No Other Position/Activity Restrictions: AFOs in room, no shoes  Therapy/Group: Individual Therapy  [facility] Jenney Modest, Door, DPT  09/09/2023, 7:46 AM

## 2023-09-09 NOTE — Progress Notes (Addendum)
 PROGRESS NOTE   Subjective/Complaints:  No CP, SOB, LE swelling at baseline, doesn't want to call for BR assist so not drinking much Review of systems: Denies chest pains, shortness of breath, nausea vomiting diarrhea constipation   Objective:   No results found. No results for input(s): "WBC", "HGB", "HCT", "PLT" in the last 72 hours.  Recent Labs    09/09/23 0534  NA 132*  K 5.5*  CL 98  CO2 27  GLUCOSE 179*  BUN 34*  CREATININE 1.75*  CALCIUM 9.0     Intake/Output Summary (Last 24 hours) at 09/09/2023 0754 Last data filed at 09/09/2023 3664 Gross per 24 hour  Intake 118 ml  Output 1025 ml  Net -907 ml        Physical Exam: Vital Signs Blood pressure (!) 103/57, pulse 70, temperature 97.9 F (36.6 C), resp. rate 18, height 6\' 1"  (1.854 m), weight 94 kg, SpO2 100%.  General: No acute distress Mood and affect are appropriate Heart: Regular rate and rhythm no rubs murmurs or extra sounds Lungs: Clear to auscultation, breathing unlabored, no rales or wheezes Abdomen: Positive bowel sounds, soft nontender to palpation, nondistended Extremities: 2+ pre tib edema  Skin: Stasis dermatitis BLE,  steristrips over ICD site, no bleeding, no erythema around dressing  Neurologic: Cranial nerves II through XII intact, motor strength is 5/5 in bilateral deltoid, bicep, tricep, grip, 2-hip flexor and abd/add , 3-knee extensors, traceankle dorsiflexor and plantar flexor Sensory exam normal sensation to light touch and proprioception in bilateral upper and lower extremities Cerebellar no dysmetria in UEs  Musculoskeletal: . No joint swelling, no tenderness Left chest no pain with shoulder flexion and abd,     Assessment/Plan: 1. Functional deficits which require 3+ hours per day of interdisciplinary therapy in a comprehensive inpatient rehab setting. Physiatrist is providing close team supervision and 24 hour management  of active medical problems listed below. Physiatrist and rehab team continue to assess barriers to discharge/monitor patient progress toward functional and medical goals  Care Tool:  Bathing    Body parts bathed by patient: Right arm, Left arm, Abdomen, Chest, Front perineal area, Right upper leg, Left upper leg, Face, Left lower leg, Right lower leg   Body parts bathed by helper: Buttocks     Bathing assist Assist Level: Minimal Assistance - Patient > 75% (simulated EOB)     Upper Body Dressing/Undressing Upper body dressing   What is the patient wearing?: Pull over shirt    Upper body assist Assist Level: Set up assist    Lower Body Dressing/Undressing Lower body dressing      What is the patient wearing?: Pants     Lower body assist Assist for lower body dressing: Minimal Assistance - Patient > 75% (completing lateral leans)     Toileting Toileting    Toileting assist Assist for toileting: Moderate Assistance - Patient 50 - 74%     Transfers Chair/bed transfer  Transfers assist     Chair/bed transfer assist level: Minimal Assistance - Patient > 75%     Locomotion Ambulation   Ambulation assist      Assist level: Minimal Assistance - Patient > 75% Assistive device:  Walker-rolling Max distance: 25 ft   Walk 10 feet activity   Assist     Assist level: Minimal Assistance - Patient > 75% Assistive device: Walker-rolling   Walk 50 feet activity   Assist Walk 50 feet with 2 turns activity did not occur: Safety/medical concerns         Walk 150 feet activity   Assist Walk 150 feet activity did not occur: Safety/medical concerns         Walk 10 feet on uneven surface  activity   Assist Walk 10 feet on uneven surfaces activity did not occur: Safety/medical concerns         Wheelchair     Assist Is the patient using a wheelchair?: Yes Type of Wheelchair: Manual    Wheelchair assist level: Supervision/Verbal cueing,  Contact Guard/Touching assist Max wheelchair distance: 75 ft    Wheelchair 50 feet with 2 turns activity    Assist        Assist Level: Minimal Assistance - Patient > 75%, Contact Guard/Touching assist   Wheelchair 150 feet activity     Assist      Assist Level: Moderate Assistance - Patient 50 - 74%   Blood pressure (!) 103/57, pulse 70, temperature 97.9 F (36.6 C), resp. rate 18, height 6\' 1"  (1.854 m), weight 94 kg, SpO2 100%.  Medical Problem List and Plan: 1. Functional deficits secondary to cardiac arrest/V-fib/V. tach with anoxic encephalopathy             -patient may shower             -ELOS/Goals:SHould be ready 4/16 would like K+, BUN and Creat to trend positively , ask for cards input  2.  Antithrombotics: -DVT/anticoagulation:  Pharmaceutical: Heparin             -antiplatelet therapy: N/A 3. Pain Management: Tylenol as needed 4. Mood/Behavior/Sleep: Provide emotional support             -antipsychotic agents: N/A 5. Neuropsych/cognition: This patient is not capable of making decisions on his own behalf. 6. Skin/Wound Care: Routine skin checks-ICD dressing removed , 2 layer steristrips remain to flake off in shower  7. Fluids/Electrolytes/Nutrition: Routine in and outs with follow-up chemistries             -pt appears to have good appetite 8.  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 Chronic LE edema  Status post ICD 08/30/2023.   -continue Amiodarone 400 mg daily, Toprol-XL 25 mg daily, Entresto 49-51 mg twice daily, Aldactone 25 mg daily Has chronic lower ext edema (paraplegia plus hx of bilateral ankle fx) so this may not be a good clinical indicator for CHF in this pt Have held aldactone, give , low vol bolus today , recheck labs in am 9.  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  Latest hemoglobin A1c 7.3. continue insulin pump   10.  Hypothyroidism.  continue Synthroid  11.   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  Followed by neurology services Dr. Godwin Lat..  Continue Dalfampridine 10 mg twice daily as well as Requip for restless legs             -pt states that restless legs have been better while in hospital May need orthotic management while at the hospital.  However has been noncompliant with his current orthoses.  -D3 supplement started, continue  12.  AKI on CKD stage III. Monitor creatinine weekly  Latest Ref Rng & Units 09/09/2023    5:34 AM 09/04/2023    5:07 AM 09/03/2023    9:13 PM  BMP  Glucose 70 - 99 mg/dL 161  096    BUN 6 - 20 mg/dL 34  21    Creatinine 0.45 - 1.24 mg/dL 4.09  8.11  9.14   Sodium 135 - 145 mmol/L 132  133    Potassium 3.5 - 5.1 mmol/L 5.5  4.6    Chloride 98 - 111 mmol/L 98  98    CO2 22 - 32 mmol/L 27  24    Calcium 8.9 - 10.3 mg/dL 9.0  8.8    BUN, Creat up, K+ up, likely poor intake (<566mL recorded in) plus aldactone, will stop aldactone and get cardiology input prior to discharge to home , ? Bolus if advised by cardiology  13.  Elevated transaminase on 4/9 after normalizing 5 d ago repeat 4/14  14. Hypotension: decrease lopressor to 12.5mg  temporarily, improved, continue this dose  May also need more volume   LOS: 6 days A FACE TO FACE EVALUATION WAS PERFORMED  Johnathan Ross 09/09/2023, 7:54 AM

## 2023-09-09 NOTE — Progress Notes (Addendum)
 Progress Note  Patient Name: Johnathan Ross Date of Encounter: 09/09/2023  Primary Cardiologist: Dorthula Nettles, DO  Subjective   Cardiology asked to re-sign on for AKI, hyperkalemia, med management. SBPs have been soft at times in the 90s. He denies any CP, SOB, dizziness, nausea, vomiting or any complaints. He has mild bilateral ankle edema which he states is chronic going back years before this hospitalization.  Dr. Wynn Banker estimates DC date of Wednesday.  Inpatient Medications    Scheduled Meds:  amiodarone  400 mg Oral Daily   cholecalciferol  1,000 Units Oral Daily   dalfampridine  10 mg Oral BID   feeding supplement  237 mL Oral TID BM   heparin  5,000 Units Subcutaneous Q8H   insulin pump   Subcutaneous TID WC, HS, 0200   levothyroxine  125 mcg Oral Q0600   metoprolol succinate  12.5 mg Oral Daily   polyethylene glycol  17 g Oral BID   rOPINIRole  0.25 mg Oral QHS   senna-docusate  2 tablet Oral BID   Continuous Infusions:  PRN Meds: acetaminophen, magic mouthwash w/lidocaine, mouth rinse   Vital Signs    Vitals:   09/08/23 0409 09/08/23 0844 09/08/23 1939 09/09/23 0450  BP: 96/60 99/62 106/70 (!) 103/57  Pulse: 66 73 72 70  Resp: 17 17 18 18   Temp: 98.4 F (36.9 C)  98.6 F (37 C) 97.9 F (36.6 C)  TempSrc: Oral     SpO2: 100%  100% 100%  Weight:      Height:        Intake/Output Summary (Last 24 hours) at 09/09/2023 0838 Last data filed at 09/09/2023 0825 Gross per 24 hour  Intake 358 ml  Output 1025 ml  Net -667 ml      09/07/2023    5:00 AM 09/06/2023    5:54 AM 09/05/2023    5:42 AM  Last 3 Weights  Weight (lbs) 207 lb 3.7 oz 206 lb 12.7 oz 208 lb 5.4 oz  Weight (kg) 94 kg 93.8 kg 94.5 kg     Telemetry    N/A - Personally Reviewed  ECG    Have ordered - Personally Reviewed  Physical Exam   GEN: No acute distress.  HEENT: Normocephalic, atraumatic, sclera non-icteric. Neck: No JVD or bruits. Cardiac: RRR no murmurs, rubs,  or gallops.  Respiratory: Clear to auscultation bilaterally. Breathing is unlabored. GI: Soft, nontender, non-distended, BS +x 4. MS: no deformity. Extremities: No clubbing or cyanosis. Mild puffy BLE ankle edema. Distal pedal pulses are 2+ and equal bilaterally. Neuro:  AAOx3. Follows commands. Psych:  Responds to questions appropriately with a normal affect.  Labs    High Sensitivity Troponin:   Recent Labs  Lab 08/25/23 1609 08/26/23 0120 08/26/23 0434 08/26/23 0719 08/26/23 0919  TROPONINIHS 290* 16 150* 233* 323*      Cardiac EnzymesNo results for input(s): "TROPONINI" in the last 168 hours. No results for input(s): "TROPIPOC" in the last 168 hours.   Chemistry Recent Labs  Lab 09/03/23 2113 09/04/23 0507 09/09/23 0534  NA  --  133* 132*  K  --  4.6 5.5*  CL  --  98 98  CO2  --  24 27  GLUCOSE  --  151* 179*  BUN  --  21* 34*  CREATININE 1.24 1.14 1.75*  CALCIUM  --  8.8* 9.0  PROT  --  5.8* 6.4*  ALBUMIN  --  3.0* 3.4*  AST  --  43* 50*  ALT  --  54* 82*  ALKPHOS  --  73 93  BILITOT  --  0.6 0.8  GFRNONAA >60 >60 47*  ANIONGAP  --  11 7     Hematology Recent Labs  Lab 09/03/23 2113 09/04/23 0507  WBC 15.5* 13.6*  RBC 5.21 4.81  HGB 15.0 13.8  HCT 44.8 41.1  MCV 86.0 85.4  MCH 28.8 28.7  MCHC 33.5 33.6  RDW 13.5 13.5  PLT 283 253    BNPNo results for input(s): "BNP", "PROBNP" in the last 168 hours.   DDimer No results for input(s): "DDIMER" in the last 168 hours.   Radiology    No results found.  Cardiac Studies   cMRI 08/29/23 IMPRESSION: 1. Mild to moderate decrease in left ventricular systolic function (LVEF =40%). 2. There is no late gadolinium enhancement in the left ventricular myocardium. Normal parametric mapping and tissue characterization. 3. Mild decrease in right ventricular systolic function (RVEF =45%). Riley Lam MD  [facility] 08/26/23   Ost LAD to Prox LAD lesion is 30% stenosed. HEMODYNAMICS:. RA:                   12 mmHg (mean) RV:                  35/10-14 mmHg PA:                  38/20 mmHg (26 mean) PCWP:            14 mmHg (mean)                  Estimated Fick CO/CI: 4.3 L/min, 1.94 L/min/m2   Thermodilution CO/CI: 4.39 L/min, 1.98 L/min/m2                              TPG                 12  mmHg                                            PVR                 2.8 Wood Units  PAPi                1.5   LVEDP: 18 IMPRESSION: Severely elevated pre capillary filling pressures Hemodynamics (PAPi, CVP/PCWP ratio) consistent with suboptimal RV function.  Mild nonobstructive coronary artery disease.    2D echo 08/26/23   1. No left ventricular thrombus is seen (Definity contrast used). Left ventricular ejection fraction, by estimation, is 30 to 35%. The left ventricle has moderately decreased function. The left ventricle  demonstrates global hypokinesis. Left ventricular  diastolic parameters are indeterminate.   2. Right ventricular systolic function is mildly reduced. The right ventricular size is normal.   3. The mitral valve is normal in structure. No evidence of mitral valve regurgitation. No evidence of mitral stenosis.   4. The aortic valve is tricuspid. Aortic valve regurgitation is not visualized. No aortic stenosis is present.   Patient Profile     53 y.o. male with DM1, hypothyroidism, HTN, multiple sclerosis, hyperlipidemia presented to ED 08/26/23 for OOH VT/VF arrest. Shocked 8 times, given epinephrine x2 and amiodarone. Intubated. UDS + benzos. Echo showed EF 30-35%, RV mildly reduced.  R/LHC showed mild nonobstructive CAD (30% ost-prox LAD), severely elevated pre capillary filling pressures, hemodynamics (PAPi, CVP/PCWP ratio) consistent with suboptimal RV function -> treated with milrinone. Felt to have acute biventricular systolic HF with RV dominant shock. cMRI 08/29/23 EF 40%, no LGE, mild decreased in RVSF. Underwent ICD implant by EP 08/30/23, also treated with amiodarone.  Discharged to CIR on 09/03/23. Cardiology asked to re-sign on for AKI and hyperkalemia.  Assessment & Plan    1. 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 with hyperkalemia superimposed on recent history of OOH VT/VF arrest treated with HF team with milrinone, amiodarone, and subsequent ICD implant, with soft BP - clinically he looks great without any complaints, reports the present edema is chronic going back many years s/p ankle injuries - baseline Cr prior to admission 1.1-1.3 so suspect some degree of CKD stage 2 at baseline - current cardiac meds: amio 400mg  daily, Toprol 12.5mg  daily (reduced 4/13),Entresto 49/51mg  BID, spironolactone 25mg  daily - will hold Entresto and spironolactone today and recheck labs in AM to help guide resumption (possibly Entresto) - check EKG to ensure stable with hyperkalemia before giving amiodarone this AM, d/w nurse - rehab suggested 250cc IVF, agree, have entered - f/u Mg also ordered - will discuss amiodarone dose plan with Dr. Lavonne Prairie (currently on 400mg  daily for now without specific down titration outlined) - addendum: EKG shows NSR 72bpm, low voltage QRS, nonspecific STTW changes, QRS duration normal, QTC normal, no peaked T waves. OK to continue with amiodarone given normal QTc interval.  2. Hypothyroidism - recent TSH 3/31 wnl  3. Transaminits - LFTs intermittently elevated though remain improved from presentation, follow  4. DM1 - per primary team  5. Mild CAD - consider statin therapy if LFTs stabilize - ?consider low dose ASA given DM  For questions or updates, please contact [facility] Please consult [email or web address] for contact info under Cardiology/STEMI.  Signed, Alexandria Angel, PA-C 09/09/2023, 8:38 AM    History and all data above reviewed.  Patient examined.  I agree with the findings as above.  The patient exam reveals COR:RRR  ,  Lungs:  Clear ,  Abd: Positive bowel sounds, no rebound no guarding, Ext No edema  .  All  available labs, radiology testing, previous records reviewed. Agree with documented assessment and plan. Hyperkalemia and increased creat:  I agree with gentle hydration and holding meds as above. I likely will not restart spironolactone.  Repeat Potassium later today.  CAD:  Non obstructive.  I agree with starting ASA.  Also with DM.  VT:  Continue amiodarone at current dose and we will decide on discharge dosing.   Royston Cornea Lilac Hoff  11:18 AM  09/09/2023

## 2023-09-09 NOTE — Progress Notes (Signed)
 Occupational Therapy Session Note  Patient Details  Name: Johnathan Ross MRN: [id number] Date of Birth: 05/07/1971  Today's Date: 09/09/2023 OT Individual Time: 1100-1200 and 7829-5621 OT Individual Time Calculation (min): 60 min and 75 min   Short Term Goals: Week 1:  OT Short Term Goal 1 (Week 1): Pt will complete toilet transfers with min A consistently using LRAD OT Short Term Goal 2 (Week 1): Pt will complete 3/3 toileting tasks min A using LRAD and AE PRN OT Short Term Goal 3 (Week 1): Pt will maintain dynamic standing balance during BADLs and functional tasks min A using LRAD OT Short Term Goal 4 (Week 1): Pt will tolerate standing >2 minutes during BADLs or functional tasks using LRAD  Skilled Therapeutic Interventions/Progress Updates:    Visit 1:  Pain: no pain, pt reports chest muscle pain at pacemaker site has resolved  Pt received in w/c fully dressed and ready for the day.  He reports he bathed himself this am.  Asked him how he and his family that have visited are feeling about his progress. He continues to say he and his family feel he is back at his baseline.  Pt agreeable to working on core and LE strength to help maximize his ability to climb stairs.  "My hip flexors are weak and I just dont see how they will get stronger" pt agreeable to trying various exercises.  Self propelled to gym and used Rw with min A to transfer 10 ft to mat.   Sitting: -body blade in each hand and B hands for oscillations for core and UE strength -weighted bar with resisted rows with blue theraband with rows with hands pronated then supinated -torso twists with bar and resistance  Supine: -single leg hip and flexion with calf resting on large physioball. Pt had to roll ball using hip.  If he extends knee to neutral then he feels muscles lock out so started at slight flexion of knee to full flexion -small soft ball under one knee for pt to push down through for quad/ hamstring activation   -ball squeeze between flexed knees for adductors and pelvic floor strength -ball squeeze with small knee sways side to side. He had difficulty maintaining squeeze on the ball -for chest expansion stretch had pt lay on small soft ball under thoracic spine for 1 minute. Pt tolerated stretch well.    Discussed the amount of walking he needs to do at home which is out of his house, down the steps and 30 ft to his trailer where he likes to work.  Pt states he has not ambulated in the community. He uses a w/c.    Pt then ambulated from mat to wc 12 ft with RW with min A with R leg scissoring toward L.  Pt states this is a normal gait pattern for him. Self propelled back to room. In room with all needs met.    Visit 2: no c/o pain Pt received in wc ready for session. To challenge his endurance and UE strength pt agreeable to self propelling w/c from room out to front entrance of the hospital.  Once outside pt navigated chair on concrete pathway around circular fountain.  Discussed his journey with MS, coping strategies.   Pt stayed outside for 10 min to get fresh air and sunshine.  Pt then self propelled chair back to gym.  Pt transferred to mat with stand pivot but had to use his hands to mobilize his legs.  Suggested  pt practice squat or swivel transfers.  Pt was able to do those with supervision. Recommended that be a method he use when he feels more fatigued. Pt sat on mat and moved to supine to complete bridges, hip adduction with ball, hip abduction with resistive band.   Seated dynamic reaching to engage core.   Transferred back to w/c and then use arm bike for 10 min for further conditioning.   Pt returned to room with all needs met.   Therapy Documentation Precautions:  Precautions Precautions: Fall Recall of Precautions/Restrictions: Impaired Precaution/Restrictions Comments: MS with LB weakness Required Braces or Orthoses: Other Brace Other Brace: AFOs in room Restrictions Weight  Bearing Restrictions Per Provider Order: No Other Position/Activity Restrictions: AFOs in room, no shoes   Pain: Pain Assessment Pain Scale: 0-10 Pain Score: 0-No pain ADL: ADL Eating: Independent, Set up Where Assessed-Eating: Chair Grooming: Setup Where Assessed-Grooming: Chair Upper Body Bathing: Supervision/safety Where Assessed-Upper Body Bathing: Edge of bed Lower Body Bathing: Minimal assistance Where Assessed-Lower Body Bathing: Edge of bed Upper Body Dressing: Setup Where Assessed-Upper Body Dressing: Edge of bed Lower Body Dressing: Minimal assistance (completing lateral leans) Where Assessed-Lower Body Dressing: Edge of bed Toileting: Moderate assistance Where Assessed-Toileting: Teacher, adult education: Moderate assistance Toilet Transfer Method: Squat pivot, Stand pivot Toilet Transfer Equipment: Grab bars Tub/Shower Transfer: Not assessed Film/video editor: Not assessed   Therapy/Group: Individual Therapy  Keiondre Colee 09/09/2023, 9:20 AM

## 2023-09-09 NOTE — Progress Notes (Signed)
 Repeat BMET stable/improving. Continue plan as outlined.

## 2023-09-10 ENCOUNTER — Other Ambulatory Visit (HOSPITAL_COMMUNITY): Payer: Self-pay

## 2023-09-10 ENCOUNTER — Inpatient Hospital Stay (HOSPITAL_COMMUNITY)

## 2023-09-10 DIAGNOSIS — G9340 Encephalopathy, unspecified: Secondary | ICD-10-CM | POA: Diagnosis not present

## 2023-09-10 DIAGNOSIS — R41840 Attention and concentration deficit: Secondary | ICD-10-CM

## 2023-09-10 DIAGNOSIS — G35 Multiple sclerosis: Secondary | ICD-10-CM | POA: Diagnosis not present

## 2023-09-10 DIAGNOSIS — E1069 Type 1 diabetes mellitus with other specified complication: Secondary | ICD-10-CM | POA: Diagnosis not present

## 2023-09-10 DIAGNOSIS — G8220 Paraplegia, unspecified: Secondary | ICD-10-CM | POA: Diagnosis not present

## 2023-09-10 LAB — CBC
HCT: 40 % (ref 39.0–52.0)
Hemoglobin: 13.4 g/dL (ref 13.0–17.0)
MCH: 29.3 pg (ref 26.0–34.0)
MCHC: 33.5 g/dL (ref 30.0–36.0)
MCV: 87.5 fL (ref 80.0–100.0)
Platelets: 293 10*3/uL (ref 150–400)
RBC: 4.57 MIL/uL (ref 4.22–5.81)
RDW: 14.4 % (ref 11.5–15.5)
WBC: 8.5 10*3/uL (ref 4.0–10.5)
nRBC: 0 % (ref 0.0–0.2)

## 2023-09-10 LAB — COMPREHENSIVE METABOLIC PANEL WITH GFR
ALT: 72 U/L — ABNORMAL HIGH (ref 0–44)
AST: 42 U/L — ABNORMAL HIGH (ref 15–41)
Albumin: 3.4 g/dL — ABNORMAL LOW (ref 3.5–5.0)
Alkaline Phosphatase: 87 U/L (ref 38–126)
Anion gap: 7 (ref 5–15)
BUN: 23 mg/dL — ABNORMAL HIGH (ref 6–20)
CO2: 26 mmol/L (ref 22–32)
Calcium: 8.6 mg/dL — ABNORMAL LOW (ref 8.9–10.3)
Chloride: 101 mmol/L (ref 98–111)
Creatinine, Ser: 1.6 mg/dL — ABNORMAL HIGH (ref 0.61–1.24)
GFR, Estimated: 52 mL/min — ABNORMAL LOW (ref >60)
Glucose, Bld: 159 mg/dL — ABNORMAL HIGH (ref 70–99)
Potassium: 4.9 mmol/L (ref 3.5–5.1)
Sodium: 134 mmol/L — ABNORMAL LOW (ref 135–145)
Total Bilirubin: 0.6 mg/dL (ref 0.0–1.2)
Total Protein: 6.1 g/dL — ABNORMAL LOW (ref 6.5–8.1)

## 2023-09-10 LAB — MAGNESIUM: Magnesium: 1.9 mg/dL (ref 1.7–2.4)

## 2023-09-10 MED ORDER — ROPINIROLE HCL 0.25 MG PO TABS
0.2500 mg | ORAL_TABLET | Freq: Every day | ORAL | 0 refills | Status: DC
  Filled 2023-09-10: qty 30, 30d supply, fill #0

## 2023-09-10 MED ORDER — LEVOTHYROXINE SODIUM 125 MCG PO TABS
125.0000 ug | ORAL_TABLET | Freq: Every day | ORAL | 0 refills | Status: DC
  Filled 2023-09-10: qty 30, 30d supply, fill #0

## 2023-09-10 MED ORDER — VITAMIN D3 25 MCG PO TABS
1000.0000 [IU] | ORAL_TABLET | Freq: Every day | ORAL | 0 refills | Status: DC
  Filled 2023-09-10: qty 30, 30d supply, fill #0

## 2023-09-10 MED ORDER — ACETAMINOPHEN 325 MG PO TABS
325.0000 mg | ORAL_TABLET | ORAL | Status: AC | PRN
Start: 2023-09-10 — End: ?

## 2023-09-10 MED ORDER — ASPIRIN 81 MG PO TBEC: 81.0000 mg | DELAYED_RELEASE_TABLET | Freq: Every day | ORAL | Status: AC

## 2023-09-10 MED ORDER — METOPROLOL SUCCINATE ER 25 MG PO TB24
12.5000 mg | ORAL_TABLET | Freq: Every day | ORAL | 0 refills | Status: DC
  Filled 2023-09-10: qty 30, 60d supply, fill #0

## 2023-09-10 MED ORDER — SIMVASTATIN 20 MG PO TABS
20.0000 mg | ORAL_TABLET | Freq: Every day | ORAL | 0 refills | Status: DC
  Filled 2023-09-10: qty 30, 30d supply, fill #0

## 2023-09-10 MED ORDER — ISOSORB DINITRATE-HYDRALAZINE 20-37.5 MG PO TABS
1.0000 | ORAL_TABLET | Freq: Two times a day (BID) | ORAL | Status: DC
  Administered 2023-09-10 – 2023-09-11 (×3): 1 via ORAL
  Filled 2023-09-10 (×3): qty 1

## 2023-09-10 MED ORDER — POLYETHYLENE GLYCOL 3350 17 G PO PACK: 17.0000 g | PACK | Freq: Every day | ORAL | Status: DC

## 2023-09-10 NOTE — Consult Note (Signed)
 Neuropsychological Consultation Comprehensive Inpatient Rehab   Patient:   Johnathan Ross   DOB:   1970/09/24  MR Number:  [id number]  Location:  [facility] [address] [place] [address] Dept: [phone] Loc: [phone]           Date of Service:   08/30/2023  Start Time:   9 AM End Time:   10 AM  Provider/Observer:  Arley Phenix, Psy.D.       Clinical Neuropsychologist       Billing Code/Service: [phone]  Reason for Service:    Johnathan Ross is a 53 year old male referred for neuropsychological consultation during his ongoing admission to the comprehensive inpatient rehabilitation unit.  Patient has a past medical history of hyperlipidemia, hypertension, hypothyroidism, diabetes with diabetic retinopathy and has an insulin pump.  Patient has been diagnosed for some time with multiple sclerosis being followed by Dr. Epimenio Foot with [facility].  Patient is noted to have been in remission for some years and doing well with regard to progression/remission of MS.  Patient has had mobility issues through the years and has modified independent mobility capacity.  Patient is modified independent with ADLs.  Patient presented on 08/26/2023 after collapsing outside of home and receiving CPR before he was found by EMS.  Patient with ventricular fibrillation noted by EMS.  Patient had 20 minutes of ACLS protocol and was intubated and brought to ED.  CT/CTA did not reveal any acute abnormalities.  Patient remained intubated until 08/27/2023.  Patient underwent cardiac catheterization with hospital course followed closely by cardiology.  Patient treated for possible aspiration pneumonia.  Once therapy evaluations were completed patient was admitted to CIR due to decreased functional mobility.  The patient was oriented but slow and information processing.  Patient was aware and was able to describe his  history of diagnosis and treatment for MS and notes no worsening or progression of symptoms over the past several years.  Patient notes effectiveness of neurology and stabilizing his MS.  Patient aware of what happened to him in general but specifics were not able to be described by him clearly.  Patient reported stable mood and denied negative/disruptive mood states interfering with therapeutic interventions.  Today we worked on coping and adjustment with current hospitalization.  HPI for the current admission:    HPI: Johnathan Ross is a 53 year old right-handed male with history of hyperlipidemia, hypertension, hypothyroidism, diabetes mellitus with proliferative diabetic retinopathy and has a insulin pump, multiple sclerosis followed by neurology service Dr. Epimenio Foot maintained on Dalfampridine. Per chart review patient lives with girlfriend. 1 level home 4 steps to entry. Girlfriend works outside the home. Modified independent mobility. Modified independent with ADLs. Presented 08/26/2023 after collapsing in the community and receiving CPR before he was found by EMS. Patient was found to have ventricular fibrillation. He went through 20 minutes of ACLS protocol, was intubated and brought to the emergency room was found to be in sinus rhythm with prolonged QTc. CT of the head and CTA of the chest did not reveal any acute abnormalities although there was some concern of aspiration in the lungs. He remained intubated 08/26/2023-08/27/2023. Echocardiogram showed ejection fraction of 30 to 35%. The left ventricle demonstrating global hypokinesis. EEG suggestive of moderate to severe diffuse encephalopathy no seizure. Admission chemistries unremarkable except glucose 150, troponin 290-1 50, urine drug screen positive benzos. Postarrest EKG normal sinus rhythm with low voltage in short P-RR  interval. No STE. Cardiac MRI done showed no evidence of LGE but did show some mild to moderate decrease in left ventricular  systolic function of 40% and a mild decrease in right ventricular systolic function. Underwent cardiac catheterization showing mild nonobstructive CAD severely elevated precapillary filling pressures. Hemodynamics consistent with suboptimal RV function. He was started on milrinone. Hospital course followed closely by cardiology services underwent ICD/PPM 08/30/2023. Elevated leukocytosis 27,100 as well as suggestion of possible aspiration placed on Rocephin per CCM. It was felt that possibly elevated leukocytosis was due to to steroid demargination received during pacer placement. He was cleared to begin subcutaneous heparin for DVT prophylaxis. AKI on CKD stage III latest creatinine 1.21. Abnormal LFTs in the setting of shock and cardiac arrest and monitored. He is tolerating a regular diet. Therapy evaluations completed due to patient decreased functional mobility was admitted for a comprehensive rehab program.   Medical History:   Past Medical History:  Diagnosis Date   Hyperlipidemia    Hypertension    Hypogonadism in male 11/01/2016   Hypothyroidism    Multiple sclerosis (HCC)    Neuromuscular disorder (HCC)    Proliferative diabetic retinopathy(362.02)    Type 1 diabetes mellitus (HCC) dx'd 1994   Vitamin D insufficiency 11/29/2016         Patient Active Problem List   Diagnosis Date Noted   Attention and concentration deficit 09/10/2023   Acute encephalopathy 09/03/2023   Cardiac arrest (HCC) 08/26/2023   Encounter for screening for HIV 06/17/2023   Need for immunization against influenza 06/17/2023   Loud snoring 06/17/2023   Encounter for general adult medical examination with abnormal findings 08/02/2022   Screening for prostate cancer 08/01/2021   OAB (overactive bladder) 09/21/2020   Essential hypertension 09/21/2020   Thoracic outlet syndrome 05/03/2020   Controlled type 1 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 (HCC) 03/24/2020   Right  epiretinal membrane 03/24/2020   Retinal exudates and deposits 03/24/2020   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   Nuclear sclerotic cataract of both eyes 03/24/2020   Gait disorder 03/23/2020   Benign prostatic hyperplasia without lower urinary tract symptoms 03/01/2020   Diabetic gastroparesis associated with type 1 diabetes mellitus (HCC) 03/01/2020   Gait disturbance 08/19/2019   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/19/2019   Vitamin D insufficiency 11/29/2016   Hypogonadism in male 11/01/2016   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HCC)    Hypothyroidism    Hypertension    Dyslipidemia, goal LDL below 130    MULTIPLE SCLEROSIS 08/29/2007   PROLIFERATIVE DIABETIC RETINOPATHY 08/29/2007    Behavioral Observation/Mental Status:   Johnathan Ross  presents as a 53 y.o.-year-old Right handed African American Male who appeared his stated age. his dress was Appropriate and he was Well Groomed and his manners were Appropriate to the situation.  his participation was indicative of Appropriate behaviors.  There were physical disabilities noted.  he displayed an appropriate level of cooperation and motivation.    Interactions:    Active Appropriate  Attention:   abnormal and attention span appeared shorter than expected for age  Memory:   within normal limits; recent and remote memory intact  Visuo-spatial:   not examined  Speech (Volume):  low  Speech:   normal; normal  Thought Process:  Coherent  Concrete and Coherent  Though Content:  WNL; not suicidal and not homicidal  Orientation:   person, place, time/date, and situation  Judgment:   Fair  Planning:   Fair  Affect:    Blunted and Lethargic  Mood:    Dysphoric  Insight:   Good  Intelligence:   normal  Family Med/Psych History:  Family History  Problem Relation Age of Onset   Diabetes Sister    Colon polyps Maternal Uncle    Colon cancer Maternal Grandmother    Stomach cancer Cousin     Esophageal cancer Neg Hx     Impression/DX:   Johnathan Ross is a 53 year old male referred for neuropsychological consultation during his ongoing admission to the comprehensive inpatient rehabilitation unit.  Patient has a past medical history of hyperlipidemia, hypertension, hypothyroidism, diabetes with diabetic retinopathy and has an insulin pump.  Patient has been diagnosed for some time with multiple sclerosis being followed by Dr. Godwin Lat with [facility].  Patient is noted to have been in remission for some years and doing well with regard to progression/remission of MS.  Patient has had mobility issues through the years and has modified independent mobility capacity.  Patient is modified independent with ADLs.  Patient presented on 08/26/2023 after collapsing outside of home and receiving CPR before he was found by EMS.  Patient with ventricular fibrillation noted by EMS.  Patient had 20 minutes of ACLS protocol and was intubated and brought to ED.  CT/CTA did not reveal any acute abnormalities.  Patient remained intubated until 08/27/2023.  Patient underwent cardiac catheterization with hospital course followed closely by cardiology.  Patient treated for possible aspiration pneumonia.  Once therapy evaluations were completed patient was admitted to CIR due to decreased functional mobility.  The patient was oriented but slow and information processing.  Patient was aware and was able to describe his history of diagnosis and treatment for MS and notes no worsening or progression of symptoms over the past several years.  Patient notes effectiveness of neurology and stabilizing his MS.  Patient aware of what happened to him in general but specifics were not able to be described by him clearly.  Patient reported stable mood and denied negative/disruptive mood states interfering with therapeutic interventions.  Today we worked on coping and adjustment with current hospitalization.           Electronically Signed   _______________________ Chapman Commodore, Psy.D. Clinical Neuropsychologist

## 2023-09-10 NOTE — Progress Notes (Signed)
 Physical Therapy Session Note  Patient Details  Name: Johnathan Ross MRN: [id number] Date of Birth: 02/11/1971  Today's Date: 09/10/2023 PT Individual Time: 1007-1103; 1410 - 1510  PT Individual Time Calculation (min): 56 min; 60 min   Short Term Goals: Week 1:  PT Short Term Goal 1 (Week 1): STG = LTG d/t ELOS  SESSION 1 Skilled Therapeutic Interventions/Progress Updates: Patient sitting in WC on entrance to room. Patient alert and agreeable to PT session.   Patient with no complaints of pain. Session focused on family education. Pt and family understood pt to get new shoes without airmax addition to avoid decreased stability in heel. Pt and family educated on pt's ambulatory tolerance (starts to fatigue after 150'+) with observable indicators (requiring more standing rest breaks, increased effort to advance B LE's, decrease in balance). Pt wife and family guided in assisting pt with seated hip flexion therex with family passively moving LE to hip flexion, and cuing pt to control eccentric (family understood to allow pt to control movement when pt is able to). Pt and family also educated on ensuring pt is breathing throughout (inhale when moved into hip flexion, then exhale slowly through mouth on eccentric). Pt self propelled in WC throughout session independently. Pt performed car transfers with supervision and VC/education to family to avoid using car door to stand as it is not stable. Pt demonstrated increase in step length during pivots with improved weight shifting during transfers and gait throughout session. Pt ambulated 142' from ortho gym<main gym with close supervision and 2 standing rest breaks. Pt required seated rest break prior to stair navigation. Pt ascended/descended 4 (6") steps with B UE support on railing with close supervision for safety. Pt advance B LE one at a time (to same step) with use of UE due to decreased hip flexor strength (anterior lean to do so with education  provided to pt and family to be close to pt if pt loses balance anteriorly). Pt descended with BHR and reciprocal step pattern. VC throughout to ensure pt places entire foot on step. Pt demonstrated method performed at home when too tired to advance LE with UE's. Pt sat to 2nd step and scooted up stairs while picking up LE's and use of railing to assist with pulling self up. Pt used BHR to bring self up to standing, and then pivoted on step to advance final 2 steps in same manner as performed on 1st trial. Pt descended with reciprocal step pattern. PTA highly encouraged pt to use shower chair method as this ensures pt to get to top of stairs without having to pivot on step. Pt denied wanting to perform. PTA provided demonstration to method, and stated that shower chair will be supplied so that pt can use in the future with pt agreeing. Pt and family with no further questions or concerns, and feels better about pt d/c home with pt also concurring.   Patient sitting in WC at end of session with brakes locked, family present, and all needs within reach.  SESSION 2 Skilled Therapeutic Interventions/Progress Updates: Patient sitting in [facility] with sister present on entrance to room. Patient alert and agreeable to PT session.   Patient with no complaints of pain during session. Pt updated on precautions to avoid stretching L chest musculature due to pacemaker. Today was pt's grad day today (see d/c for summary). HEP provided. Pt and sister with no future concerns of questions at end of session. Pt sister updated on precautions discussed during  session.   Therapeutic Activity: Transfers: Pt performed sit<>stand pivot transfers throughout session with RW modI. Provided VC to recall precaution to avoid stretch L UE back to reach to avoid straining pacemaker site.   Access Code: [email or web address] URL: [email or web address] Date: 09/10/2023 Prepared by: Seferino Dade  Exercises - Seated March Assisted  (Slowly Let Down)  - 1 x daily - 7 x weekly - 3 sets - 10 reps - 1-2 second hold - Seated March Assisted (Slowly Let Down)  - 1 x daily - 7 x weekly - 3 sets - 10 reps  Pt navigated 2 steps using scooting/bumping method (sitting on stair and bumping up step with B UE assistance). Pt cued to keep UE's close to body with back against next step to avoid stretching pectoralis musculature on L. Pt performed with good technique. When standing up, pt cued to use R UE to pull self up with L UE assisting with pushing next to hips on stair, then grabbing onto railing when standing to avoid pulling. Pt descended rest of steps with B UE support with reciprocal step pattern and supervision. PTA still encouraged pt to use shower chair method, but pt denied use as of this time, but open to having one provided to potentially use in the future if needed.  Pt self propelled WC during session independently with added cue to avoid extending L UE, and to instead perform smaller rotations to maintain precautions.  Patient sitting in WC at end of session with brakes locked, sister present, and all needs within reach.       Therapy Documentation Precautions:  Precautions Precautions: Fall Recall of Precautions/Restrictions: Impaired Precaution/Restrictions Comments: MS with LB weakness Required Braces or Orthoses: Other Brace Other Brace: AFOs in room Restrictions Weight Bearing Restrictions Per Provider Order: No Other Position/Activity Restrictions: AFOs in room, no shoes  Therapy/Group: Individual Therapy  Promiss Labarbera PTA 09/10/2023, 12:53 PM

## 2023-09-10 NOTE — Progress Notes (Signed)
 Physical Therapy Discharge Summary  Patient Details  Name: Johnathan Ross MRN: [id number] Date of Birth: 1970-12-22  Date of Discharge from PT service:September 10, 2023  Patient has met 8 of 8 long term goals due to improved activity tolerance, improved balance, improved postural control, increased strength, decreased pain, ability to compensate for deficits, improved attention, and improved awareness.  Patient to discharge at an ambulatory level Supervision.   Patient's care partner is independent to provide the necessary physical assistance at discharge.  Recommendation:  Patient will benefit from ongoing skilled PT services in outpatient setting to continue to advance safe functional mobility, address ongoing impairments in strength, balance, endurance, and minimize fall risk.  Equipment: Paediatric nurse to assist with stair navigation in the future  Reasons for discharge: treatment goals met  Patient/family agrees with progress made and goals achieved: Yes  PT Discharge Precautions/Restrictions Precautions Precautions: Fall Precaution/Restrictions Comments: MS with LB weakness; ICD place 08/30/23 - no LUE overhead lifting or pulling for 6 weeks Required Braces or Orthoses: Other Brace Other Brace: AFOs Restrictions Weight Bearing Restrictions Per Provider Order: No Pain Pain Assessment Pain Scale: 0-10 Pain Score: 0-No pain Pain Interference Pain Interference Pain Effect on Sleep: 1. Rarely or not at all Pain Interference with Therapy Activities: 1. Rarely or not at all Pain Interference with Day-to-Day Activities: 1. Rarely or not at all Vision/Perception  Vision - History Ability to See in Adequate Light: 0 Adequate Perception Perception: Within Functional Limits Praxis Praxis: WFL  Cognition Overall Cognitive Status: Within Functional Limits for tasks assessed Arousal/Alertness: Awake/alert Orientation Level: Oriented X4 Attention: Selective;Sustained Sustained  Attention: Appears intact Selective Attention: Appears intact Memory: Appears intact Awareness: Appears intact Problem Solving: Appears intact Reasoning: Appears intact Self Monitoring: Appears intact Self Correcting: Appears intact Safety/Judgment: Impaired Comments: Pt can overestimate physical abilities. Education provided to pt and family of CLOF and deficits. Sensation Sensation Light Touch: Impaired Detail Light Touch Impaired Details: Impaired RLE;Impaired LUE Hot/Cold: Appears Intact Coordination Gross Motor Movements are Fluid and Coordinated: No Fine Motor Movements are Fluid and Coordinated: Yes Coordination and Movement Description: decreased coordination d/t BLE weakness and increased edema Motor  Motor Motor: Motor impersistence Motor - Skilled Clinical Observations: decreased LE muscle activation/ motor control 2/2 MS with increased edema Motor - Discharge Observations: decreased LE muscle activation/ motor control 2/2 MS with increased edema  Mobility Bed Mobility Bed Mobility: Rolling Right;Rolling Left;Sit to Supine Rolling Right: Independent Rolling Left: Independent Sit to Supine: Independent Transfers Transfers: Stand to Sit;Sit to Stand;Stand Pivot Transfers Sit to Stand: Independent with assistive device Stand to Sit: Independent with assistive device Stand Pivot Transfers: Independent with assistive device Transfer (Assistive device): Rolling walker Locomotion  Gait Ambulation: Yes Gait Assistance: Supervision/Verbal cueing Gait Distance (Feet): 142 Feet Assistive device: Rolling walker Gait Gait: Yes Gait Pattern: Impaired Gait Pattern: Decreased hip/knee flexion - right;Decreased hip/knee flexion - left;Decreased dorsiflexion - left;Decreased dorsiflexion - right;Decreased weight shift to left;Decreased weight shift to right;Poor foot clearance - left;Poor foot clearance - right;Right hip hike;Left hip hike;Right genu recurvatum;Left genu  recurvatum Gait velocity: decreased Stairs / Additional Locomotion Stairs: Yes Stairs Assistance: Supervision/Verbal cueing Stair Management Technique: Two rails;Step to pattern Number of Stairs: 8 Height of Stairs: 6 Wheelchair Mobility Wheelchair Mobility: Yes Wheelchair Assistance: Set up Education officer, museum: Both upper extremities Wheelchair Parts Management: Supervision/cueing Distance: 100 ft  Trunk/Postural Assessment  Cervical Assessment Cervical Assessment: Exceptions to [facility] (Forward head and rounded shoulders 2/2 tight traps) Thoracic Assessment Thoracic Assessment:  Exceptions to [facility] (posterior pelvic tilt with decreased ability to maintain elongated trunk) Postural Control Postural Control: Deficits on evaluation Trunk Control: decreased in standing, relies heavily on B UEs supported on RW in standing  Balance Balance Balance Assessed: Yes Static Sitting Balance Static Sitting - Balance Support: Feet supported Static Sitting - Level of Assistance: 7: Independent Dynamic Sitting Balance Dynamic Sitting - Balance Support: Feet supported Dynamic Sitting - Level of Assistance: 7: Independent Dynamic Sitting - Balance Activities: Reaching for objects Static Standing Balance Static Standing - Balance Support: During functional activity;Bilateral upper extremity supported Static Standing - Level of Assistance: 6: Modified independent (Device/Increase time) Dynamic Standing Balance Dynamic Standing - Balance Support: During functional activity;Bilateral upper extremity supported Dynamic Standing - Level of Assistance: 5: Stand by assistance Extremity Assessment  RUE Assessment RUE Assessment: Within Functional Limits LUE Assessment LUE Assessment: Within Functional Limits RLE Assessment RLE Assessment: Exceptions to [facility] RLE Strength RLE Overall Strength: Deficits Right Hip Flexion: 3-/5 Right Hip Extension: 3/5 Right Hip ABduction: 3+/5 Right Hip  ADduction: 4-/5 Right Knee Flexion: 3-/5 Right Knee Extension: 4/5 Right Ankle Dorsiflexion: 1/5 Right Ankle Plantar Flexion: 2-/5 LLE Assessment LLE Assessment: Exceptions to [facility] LLE Strength LLE Overall Strength: Deficits Left Hip Flexion: 3-/5 Left Hip Extension: 3/5 Left Hip ABduction: 3+/5 Left Hip ADduction: 4-/5 Left Knee Flexion: 3-/5 Left Knee Extension: 4-/5 Left Ankle Dorsiflexion: 2-/5 Left Ankle Plantar Flexion: 3/5   Dominic Sandoval PTA Jenney Modest, PT, DPT   09/10/2023, 3:36 PM

## 2023-09-10 NOTE — Progress Notes (Addendum)
 Occupational Therapy Session Note  Patient Details  Name: Johnathan Ross MRN: [id number] Date of Birth: 1971/03/08  Today's Date: 09/10/2023 OT Individual Time: 4098-1191 and 1135-1200 OT Individual Time Calculation (min): 60 min and 25 min    Short Term Goals: Week 1:  OT Short Term Goal 1 (Week 1): Pt will complete toilet transfers with min A consistently using LRAD OT Short Term Goal 2 (Week 1): Pt will complete 3/3 toileting tasks min A using LRAD and AE PRN OT Short Term Goal 3 (Week 1): Pt will maintain dynamic standing balance during BADLs and functional tasks min A using LRAD OT Short Term Goal 4 (Week 1): Pt will tolerate standing >2 minutes during BADLs or functional tasks using LRAD  Skilled Therapeutic Interventions/Progress Updates:    Visit 1:  no c/o pain  Pt dressed and ready for the day and family present (significant other, mother, and sister) for family education.  Reviewed with family that pt has been completing his self care with nursing staff set up and has not wanted to do self care during his OT sessions. Pt is feeling confident about managing his self care.   Discussed transfer options for the bathroom.  Pt will need to ambulate into the bathroom due to the door width. Suggested as an alternative method when he is very fatigued at the end of the day that he use a transport chair to enter bathroom so he can complete  a squat pivot.   Pt and family taken to tub room to practice hands on tub bench transfers. Pt able to complete a squat pivot with supervision to the bench from a w/c and close S with RW when he has his AFOs on.  If pt removes AFOS, he requires a heavy min A. Recommended he have AFOS on then sitting on bench doff them, get into shower and then after drying carefully redon them to walk out of the bathroom.    Discussed possibility of suction grab bar also making family aware of concerns of suction not adhering well. Recommended an actual bar.   Pt then used  wc to move to gym.  Pt is usually using a RW but wanted family to see pt complete squat pivots so they know the safe method of transfer when his legs are getting weaker at the end of the day.    Demonstrated pt's HEP using a mini swiss ball that they can purchase an inexpensive playground ball to use.  HEP focused on LE strength, hip flexors and core.  Pt does have an exercise band for UE, but needs to limited L shoulder flexion and extension for 6 wks total due to ICD placement.   Worked on a safer and more efficient way of moving supine to sit. Pt tends to "throw" his legs off the bed. Had pt practice bending his knees and rolling to the side and then bringing torso to the side to push up. Pt able to do this independently.  Pt and family returned to room.   Visit 2: Pain: no c/o pain Pt and sister in room with cardiology PA checking ICD placement. PA clarified that he should not reach arm behind back or over head or do pulling actions with L arm until May 16.  Explained to her that pt has been having to use his arms a great deal with mobility and has done some band exercises. She is checking placement of ICD via xray later today. Pt reports no pain in  upper chest.    Used this session to find and print out exercises focusing on LEs and core that do not implement UEs.  Printed out HEP and reviewed exercises with pt.   Pt and family state that they feel he is at his baseline and ready for discharge tomorrow.    Therapy Documentation Precautions:  Precautions Precautions: Fall Recall of Precautions/Restrictions: Impaired Precaution/Restrictions Comments: MS with LB weakness Required Braces or Orthoses: Other Brace Other Brace: AFOs in room Restrictions Weight Bearing Restrictions Per Provider Order: No Other Position/Activity Restrictions: AFOs in room, no shoes    Vital Signs: Therapy Vitals Temp: 98.2 F (36.8 C) Temp Source: Oral Pulse Rate: 80 Resp: 17 BP: 98/65 Patient Position  (if appropriate): Sitting Oxygen Therapy SpO2: 100 % O2 Device: Room Air    ADL: ADL Eating: Independent Where Assessed-Eating: Chair Grooming: Modified independent Where Assessed-Grooming: Chair Upper Body Bathing: Modified independent Where Assessed-Upper Body Bathing: Shower Lower Body Bathing: Modified independent Where Assessed-Lower Body Bathing: Shower Upper Body Dressing: Modified independent (Device) Where Assessed-Upper Body Dressing: Wheelchair Lower Body Dressing: Modified independent Where Assessed-Lower Body Dressing: Wheelchair Toileting: Modified independent Where Assessed-Toileting: Teacher, adult education: Engineer, agricultural Method: Ambulance person: Engineer, technical sales: Close supervison Web designer Method: Conservation officer, historic buildings: Insurance underwriter: Close supervision Film/video editor Method: Administrator: Shower seat with back, Grab bars ADL Comments: per pt report as pt has always completed his self care early in AM prior to therapy sessions and has not wanted to practice with the therapists. He has demonstrated toilet, shower stall and tub bench transfers in a "dry run" manner.   Therapy/Group: Individual Therapy  Latishia Suitt 09/10/2023, 1:33 PM

## 2023-09-10 NOTE — Plan of Care (Signed)
  Problem: Sit to Stand Goal: LTG:  Patient will perform sit to stand in prep for activites of daily living with assistance level (OT) Description: LTG:  Patient will perform sit to stand in prep for activites of daily living with assistance level (OT) Outcome: Completed/Met   Problem: RH Bathing Goal: LTG Patient will bathe all body parts with assist levels (OT) Description: LTG: Patient will bathe all body parts with assist levels (OT) Outcome: Completed/Met   Problem: RH Dressing Goal: LTG Patient will perform lower body dressing w/assist (OT) Description: LTG: Patient will perform lower body dressing with assist, with/without cues in positioning using equipment (OT) Outcome: Completed/Met   Problem: RH Toileting Goal: LTG Patient will perform toileting task (3/3 steps) with assistance level (OT) Description: LTG: Patient will perform toileting task (3/3 steps) with assistance level (OT)  Outcome: Completed/Met   Problem: RH Simple Meal Prep Goal: LTG Patient will perform simple meal prep w/assist (OT) Description: LTG: Patient will perform simple meal prep with assistance, with/without cues (OT). Outcome: Completed/Met   Problem: RH Laundry Goal: LTG Patient will perform laundry w/assist, cues (OT) Description: LTG: Patient will perform laundry with assistance, with/without cues (OT). Outcome: Completed/Met   Problem: RH Toilet Transfers Goal: LTG Patient will perform toilet transfers w/assist (OT) Description: LTG: Patient will perform toilet transfers with assist, with/without cues using equipment (OT) Outcome: Completed/Met   Problem: RH Tub/Shower Transfers Goal: LTG Patient will perform tub/shower transfers w/assist (OT) Description: LTG: Patient will perform tub/shower transfers with assist, with/without cues using equipment (OT) Outcome: Completed/Met   Problem: RH Memory Goal: LTG Patient will demonstrate ability for day to day recall/carry over during activities  of daily living with assistance level (OT) Description: LTG:  Patient will demonstrate ability for day to day recall/carry over during activities of daily living with assistance level (OT). Outcome: Completed/Met   Problem: RH Awareness Goal: LTG: Patient will demonstrate awareness during functional activites type of (OT) Description: LTG: Patient will demonstrate awareness during functional activites type of (OT) Outcome: Completed/Met

## 2023-09-10 NOTE — Progress Notes (Signed)
 Occupational Therapy Discharge Summary  Patient Details  Name: Johnathan Ross MRN: [id number] Date of Birth: 1970/08/15  Date of Discharge from OT service:September 10, 2023     Patient has met 10 of 10 long term goals due to improved activity tolerance, improved balance, postural control, and ability to compensate for deficits.  Patient to discharge at overall Modified Independent level.  Patient's care partner is independent to provide the necessary physical and cognitive assistance at discharge.  Family education completed with significant other, sister, and mother.   Reasons goals not met: n/a  Recommendation:  No further OT services at this time.   Equipment: Drop arm BSC;  pt already has a transfer tub bench  Reasons for discharge: treatment goals met  Patient/family agrees with progress made and goals achieved: Yes  OT Discharge Precautions/Restrictions  Precautions Precautions: Fall Precaution/Restrictions Comments: MS with LB weakness; ICD place 08/30/23 - no LUE overhead lifting or pulling for 6 weeks Other Brace: AFOs Restrictions Weight Bearing Restrictions Per Provider Order: No  ADL ADL Eating: Independent Where Assessed-Eating: Chair Grooming: Modified independent Where Assessed-Grooming: Chair Upper Body Bathing: Modified independent Where Assessed-Upper Body Bathing: Shower Lower Body Bathing: Modified independent Where Assessed-Lower Body Bathing: Shower Upper Body Dressing: Modified independent (Device) Where Assessed-Upper Body Dressing: Wheelchair Lower Body Dressing: Modified independent Where Assessed-Lower Body Dressing: Wheelchair Toileting: Modified independent Where Assessed-Toileting: Teacher, adult education: Engineer, agricultural Method: Ambulance person: Engineer, technical sales: Close supervison Web designer Method: Conservation officer, historic buildings: Adult nurse: Close supervision Film/video editor Method: Administrator: Shower seat with back, Grab bars ADL Comments: per pt report as pt has always completed his self care early in AM prior to therapy sessions and has not wanted to practice with the therapists. He has demonstrated toilet, shower stall and tub bench transfers in a "dry run" manner. Vision Baseline Vision/History: 0 No visual deficits Patient Visual Report: No change from baseline Vision Assessment?: No apparent visual deficits Perception  Perception: Within Functional Limits Praxis Praxis: WFL Cognition Cognition Overall Cognitive Status: Within Functional Limits for tasks assessed Safety/Judgment: Impaired Comments: pt often overestimates his physical ability, spent time education pt and family on the importance of rest and monitoring over exertion Brief Interview for Mental Status (BIMS) Repetition of Three Words (First Attempt): 3 Temporal Orientation: Year: Correct Temporal Orientation: Month: Accurate within 5 days Temporal Orientation: Day: Correct Recall: "Sock": Yes, no cue required Recall: "Blue": Yes, after cueing ("a color") Recall: "Bed": Yes, after cueing ("a piece of furniture") BIMS Summary Score: 13 Sensation Sensation Light Touch Impaired Details: Impaired RLE;Impaired LUE Hot/Cold: Appears Intact Coordination Gross Motor Movements are Fluid and Coordinated: No Fine Motor Movements are Fluid and Coordinated: Yes Coordination and Movement Description: decreased coordination d/t BLE weakness and increased edema Motor  Motor Motor - Skilled Clinical Observations: decreased LE muscle activation/ motor control 2/2 MS with increased edema Motor - Discharge Observations: decreased LE muscle activation/ motor control 2/2 MS with increased edema Mobility    Mod ind with squat pivots, close S with RW transfers Trunk/Postural Assessment  Thoracic Assessment Thoracic  Assessment:  (kyphosis with limited active trunk elongation) Postural Control Trunk Control: decreased in standing, relies heavily on B UEs supported on RW in standing  Balance Static Sitting Balance Static Sitting - Level of Assistance: 7: Independent Dynamic Sitting Balance Dynamic Sitting - Level of Assistance: 6: Modified independent (Device/Increase time) Static Standing Balance Static  Standing - Level of Assistance: 6: Modified independent (Device/Increase time) Dynamic Standing Balance Dynamic Standing - Level of Assistance: 4: Min assist Extremity/Trunk Assessment RUE Assessment RUE Assessment: Within Functional Limits LUE Assessment LUE Assessment: Within Functional Limits   Hazael Olveda 09/10/2023, 1:57 PM

## 2023-09-10 NOTE — Progress Notes (Addendum)
 Patient underwent single chamber ICD implant 08/30/23 Seen today for wound check visit  Steri strips removed without difficulty ~85mm very superficial wound edge not completely approximated though healed well otherwise and is without open wound.  In review with patient/sister/OT at bedside has been participating in arm exercises including resistance band exercises using LUE as well. We reviewed LUE restrictions remain to 6 weeks post implant  Device check with stable impedance, sensing Threshold post implant day #1 was 0.5/0.5 >> TODAY is 1.25/0.5 Will get a pCXR done to ensure lead position stability  Mertha Abrahams, PA-C   ADDEND CXR reviewed with Dr. Lawana Pray Lead has moved, though remains in the RV in an acceptable position Given no pacing burden, thresholds also acceptable, no need for revision at this time Will plan to have him see me in a month recheck his numbers and repeat CXR  Mertha Abrahams, PA-C

## 2023-09-10 NOTE — Progress Notes (Addendum)
 PROGRESS NOTE   Subjective/Complaints:  Appreciate cardiology assist Review of systems: Denies chest pains, shortness of breath, nausea vomiting diarrhea constipation   Objective:   No results found. Recent Labs    09/10/23 0524  WBC 8.5  HGB 13.4  HCT 40.0  PLT 293    Recent Labs    09/09/23 1557 09/10/23 0524  NA 133* 134*  K 4.9 4.9  CL 97* 101  CO2 28 26  GLUCOSE 90 159*  BUN 29* 23*  CREATININE 1.68* 1.60*  CALCIUM 9.4 8.6*     Intake/Output Summary (Last 24 hours) at 09/10/2023 0704 Last data filed at 09/10/2023 0538 Gross per 24 hour  Intake 716 ml  Output 1750 ml  Net -1034 ml        Physical Exam: Vital Signs Blood pressure 133/73, pulse 83, temperature 98.5 F (36.9 C), temperature source Oral, resp. rate 18, height 6\' 1"  (1.854 m), weight 94.6 kg, SpO2 100%.  General: No acute distress Mood and affect are appropriate Heart: Regular rate and rhythm no rubs murmurs or extra sounds Lungs: Clear to auscultation, breathing unlabored, no rales or wheezes Abdomen: Positive bowel sounds, soft nontender to palpation, nondistended Extremities: 2+ pre tib edema  Skin: Stasis dermatitis BLE,  steristrips over ICD site, no bleeding, no erythema around dressing  Neurologic: Cranial nerves II through XII intact, motor strength is 5/5 in bilateral deltoid, bicep, tricep, grip, 2-hip flexor and abd/add , 3-knee extensors, traceankle dorsiflexor and plantar flexor Sensory exam normal sensation to light touch and proprioception in bilateral upper and lower extremities Cerebellar no dysmetria in UEs  Musculoskeletal: . No joint swelling, no tenderness Left chest no pain with shoulder flexion and abd,     Assessment/Plan: 1. Functional deficits which require 3+ hours per day of interdisciplinary therapy in a comprehensive inpatient rehab setting. Physiatrist is providing close team supervision and 24 hour  management of active medical problems listed below. Physiatrist and rehab team continue to assess barriers to discharge/monitor patient progress toward functional and medical goals  Care Tool:  Bathing    Body parts bathed by patient: Right arm, Left arm, Abdomen, Chest, Front perineal area, Right upper leg, Left upper leg, Face, Left lower leg, Right lower leg   Body parts bathed by helper: Buttocks     Bathing assist Assist Level: Minimal Assistance - Patient > 75% (simulated EOB)     Upper Body Dressing/Undressing Upper body dressing   What is the patient wearing?: Pull over shirt    Upper body assist Assist Level: Set up assist    Lower Body Dressing/Undressing Lower body dressing      What is the patient wearing?: Pants     Lower body assist Assist for lower body dressing: Minimal Assistance - Patient > 75% (completing lateral leans)     Toileting Toileting    Toileting assist Assist for toileting: Moderate Assistance - Patient 50 - 74%     Transfers Chair/bed transfer  Transfers assist     Chair/bed transfer assist level: Minimal Assistance - Patient > 75%     Locomotion Ambulation   Ambulation assist      Assist level: Minimal Assistance -  Patient > 75% Assistive device: Walker-rolling Max distance: 25 ft   Walk 10 feet activity   Assist     Assist level: Minimal Assistance - Patient > 75% Assistive device: Walker-rolling   Walk 50 feet activity   Assist Walk 50 feet with 2 turns activity did not occur: Safety/medical concerns         Walk 150 feet activity   Assist Walk 150 feet activity did not occur: Safety/medical concerns         Walk 10 feet on uneven surface  activity   Assist Walk 10 feet on uneven surfaces activity did not occur: Safety/medical concerns         Wheelchair     Assist Is the patient using a wheelchair?: Yes Type of Wheelchair: Manual    Wheelchair assist level: Supervision/Verbal  cueing, Contact Guard/Touching assist Max wheelchair distance: 75 ft    Wheelchair 50 feet with 2 turns activity    Assist        Assist Level: Minimal Assistance - Patient > 75%, Contact Guard/Touching assist   Wheelchair 150 feet activity     Assist      Assist Level: Moderate Assistance - Patient 50 - 74%   Blood pressure 133/73, pulse 83, temperature 98.5 F (36.9 C), temperature source Oral, resp. rate 18, height 6\' 1"  (1.854 m), weight 94.6 kg, SpO2 100%.  Medical Problem List and Plan: 1. Functional deficits secondary to cardiac arrest/V-fib/V. tach with anoxic encephalopathy             -patient may shower             -ELOS/Goals:SHould be ready 4/16 would like K+, BUN and Creat to trend positively , Cards to re eval meds today  2.  Antithrombotics: -DVT/anticoagulation:  Pharmaceutical: Heparin             -antiplatelet therapy: N/A 3. Pain Management: Tylenol as needed 4. Mood/Behavior/Sleep: Provide emotional support             -antipsychotic agents: N/A 5. Neuropsych/cognition: This patient is not capable of making decisions on his own behalf. 6. Skin/Wound Care: Routine skin checks-ICD dressing removed , 2 layer steristrips remain to flake off in shower  7. Fluids/Electrolytes/Nutrition: Routine in and outs with follow-up chemistries             -pt appears to have good appetite 8.  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 Chronic LE edema  Status post ICD 08/30/2023.   -continue Amiodarone 400 mg daily, Toprol-XL 25 mg daily, Entresto 49-51 mg twice daily (hold pending cards re eval) , Aldactone 25 mg daily (Hold pending cards re eval) Has chronic lower ext edema (paraplegia plus hx of bilateral ankle fx) so this may not be a good clinical indicator for CHF in this pt Have held aldactone, give , low vol bolus today , recheck labs in am 9.  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   Latest hemoglobin A1c 7.3. continue insulin pump   10.  Hypothyroidism.  continue Synthroid  11.  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  Followed by neurology services Dr. Epimenio Foot..  Continue Dalfampridine 10 mg twice daily as well as Requip for restless legs             -pt states that restless legs have been better while in hospital May need orthotic management while at the hospital.  However has been noncompliant  with his current orthoses.  -D3 supplement started, continue  12.  AKI on CKD stage III. Monitor creatinine weekly    Latest Ref Rng & Units 09/10/2023    5:24 AM 09/09/2023    3:57 PM 09/09/2023    5:34 AM  BMP  Glucose 70 - 99 mg/dL 161  90  096   BUN 6 - 20 mg/dL 23  29  34   Creatinine 0.61 - 1.24 mg/dL 0.45  4.09  8.11   Sodium 135 - 145 mmol/L 134  133  132   Potassium 3.5 - 5.1 mmol/L 4.9  4.9  5.5   Chloride 98 - 111 mmol/L 101  97  98   CO2 22 - 32 mmol/L 26  28  27    Calcium 8.9 - 10.3 mg/dL 8.6  9.4  9.0   BUN, Creat up, K+ up, likely poor intake (<564mL recorded in) plus aldactone, will stop aldactone cards to re eval today  13.  Elevated transaminase on 4/9 after normalizing 5 d ago repeat 4/14  14. Hypotension: decrease lopressor to 12.5mg  , (entresto and aldactone held x 1 d, IVF bolus 4/14) improved, continue this dose Vitals:   09/09/23 1815 09/10/23 0535  BP: 126/62 133/73  Pulse: 79 83  Resp: 16 18  Temp: 98.2 F (36.8 C) 98.5 F (36.9 C)  SpO2: 100% 100%      LOS: 7 days A FACE TO FACE EVALUATION WAS PERFORMED  Genetta Kenning 09/10/2023, 7:04 AM

## 2023-09-10 NOTE — Progress Notes (Signed)
 Patient ID: Johnathan Ross, male   DOB: [birth year], 53 y.o.   MRN: [id number]  Per attending, pt can discharge to home tomorrow.   SW met with pt and pt sister in room to review above. Pt aware of release tomorrow. SW will send outpatient therapy referral back to [facility] for PT. Family is encouraged to follow-up with insurance plan to discuss remaining sessions; they were also informed at return appointment with outpatient, they will be informed as well of remaining session. Pt reports he likely has two weeks remaining.   Norval Been, MSW, LCSW Office: [phone] Cell: [phone] Fax: [phone]

## 2023-09-10 NOTE — Progress Notes (Addendum)
 Progress Note  Patient Name: Johnathan Ross Date of Encounter: 09/10/2023  Primary Cardiologist: Dorthula Nettles, DO  Subjective   Feeling great, no complaints. No CP or SOB.  Inpatient Medications    Scheduled Meds:  amiodarone  400 mg Oral Daily   aspirin EC  81 mg Oral Daily   cholecalciferol  1,000 Units Oral Daily   dalfampridine  10 mg Oral BID   feeding supplement  237 mL Oral TID BM   insulin pump   Subcutaneous TID WC, HS, 0200   levothyroxine  125 mcg Oral Q0600   metoprolol succinate  12.5 mg Oral Daily   polyethylene glycol  17 g Oral BID   rOPINIRole  0.25 mg Oral QHS   senna-docusate  2 tablet Oral BID   Continuous Infusions:  PRN Meds: acetaminophen, magic mouthwash w/lidocaine, mouth rinse   Vital Signs    Vitals:   09/09/23 1315 09/09/23 1815 09/10/23 0500 09/10/23 0535  BP: 116/72 126/62  133/73  Pulse: 75 79  83  Resp: 18 16  18   Temp: 98.1 F (36.7 C) 98.2 F (36.8 C)  98.5 F (36.9 C)  TempSrc: Oral Oral  Oral  SpO2: 100% 100%  100%  Weight:   94.6 kg   Height:        Intake/Output Summary (Last 24 hours) at 09/10/2023 0749 Last data filed at 09/10/2023 0747 Gross per 24 hour  Intake 956 ml  Output 1750 ml  Net -794 ml      09/10/2023    5:00 AM 09/07/2023    5:00 AM 09/06/2023    5:54 AM  Last 3 Weights  Weight (lbs) 208 lb 8.9 oz 207 lb 3.7 oz 206 lb 12.7 oz  Weight (kg) 94.6 kg 94 kg 93.8 kg     Telemetry    N/A - Personally Reviewed  Physical Exam    GEN: No acute distress.  HEENT: Normocephalic, atraumatic, sclera non-icteric. Neck: No JVD or bruits. Cardiac: RRR no murmurs, rubs, or gallops.  Respiratory: Clear to auscultation bilaterally. Breathing is unlabored. GI: Soft, nontender, non-distended, BS +x 4. MS: no deformity. Extremities: No clubbing or cyanosis. Mild puffy BLE ankle edema. Distal pedal pulses are 2+ and equal bilaterally. Neuro:  AAOx3. Follows commands. Psych:  Responds to questions  appropriately with a normal affect.  Labs    High Sensitivity Troponin:   Recent Labs  Lab 08/25/23 1609 08/26/23 0120 08/26/23 0434 08/26/23 0719 08/26/23 0919  TROPONINIHS 290* 16 150* 233* 323*      Cardiac EnzymesNo results for input(s): "TROPONINI" in the last 168 hours. No results for input(s): "TROPIPOC" in the last 168 hours.   Chemistry Recent Labs  Lab 09/04/23 0507 09/09/23 0534 09/09/23 1557 09/10/23 0524  NA 133* 132* 133* 134*  K 4.6 5.5* 4.9 4.9  CL 98 98 97* 101  CO2 24 27 28 26   GLUCOSE 151* 179* 90 159*  BUN 21* 34* 29* 23*  CREATININE 1.14 1.75* 1.68* 1.60*  CALCIUM 8.8* 9.0 9.4 8.6*  PROT 5.8* 6.4*  --  6.1*  ALBUMIN 3.0* 3.4*  --  3.4*  AST 43* 50*  --  42*  ALT 54* 82*  --  72*  ALKPHOS 73 93  --  87  BILITOT 0.6 0.8  --  0.6  GFRNONAA >60 47* 49* 52*  ANIONGAP 11 7 8 7      Hematology Recent Labs  Lab 09/03/23 2113 09/04/23 0507 09/10/23 0524  WBC 15.5* 13.6* 8.5  RBC 5.21 4.81 4.57  HGB 15.0 13.8 13.4  HCT 44.8 41.1 40.0  MCV 86.0 85.4 87.5  MCH 28.8 28.7 29.3  MCHC 33.5 33.6 33.5  RDW 13.5 13.5 14.4  PLT 283 253 293    BNPNo results for input(s): "BNP", "PROBNP" in the last 168 hours.   DDimer No results for input(s): "DDIMER" in the last 168 hours.   Radiology    No results found.  Cardiac Studies    cMRI 08/29/23 IMPRESSION: 1. Mild to moderate decrease in left ventricular systolic function (LVEF =40%). 2. There is no late gadolinium enhancement in the left ventricular myocardium. Normal parametric mapping and tissue characterization. 3. Mild decrease in right ventricular systolic function (RVEF =45%). Gloriann Larger MD   [facility] 08/26/23   Ost LAD to Prox LAD lesion is 30% stenosed. HEMODYNAMICS:. RA:                  12 mmHg (mean) RV:                  35/10-14 mmHg PA:                  38/20 mmHg (26 mean) PCWP:            14 mmHg (mean)                  Estimated Fick CO/CI: 4.3 L/min, 1.94 L/min/m2    Thermodilution CO/CI: 4.39 L/min, 1.98 L/min/m2                              TPG                 12  mmHg                                            PVR                 2.8 Wood Units  PAPi                1.5   LVEDP: 18 IMPRESSION: Severely elevated pre capillary filling pressures Hemodynamics (PAPi, CVP/PCWP ratio) consistent with suboptimal RV function.  Mild nonobstructive coronary artery disease.    2D echo 08/26/23   1. No left ventricular thrombus is seen (Definity contrast used). Left ventricular ejection fraction, by estimation, is 30 to 35%. The left ventricle has moderately decreased function. The left ventricle  demonstrates global hypokinesis. Left ventricular  diastolic parameters are indeterminate.   2. Right ventricular systolic function is mildly reduced. The right ventricular size is normal.   3. The mitral valve is normal in structure. No evidence of mitral valve regurgitation. No evidence of mitral stenosis.   4. The aortic valve is tricuspid. Aortic valve regurgitation is not visualized. No aortic stenosis is present.  Patient Profile      53 y.o. male with DM1, hypothyroidism, HTN, multiple sclerosis, hyperlipidemia presented to ED 08/26/23 for OOH VT/VF arrest. Shocked 8 times, given epinephrine x2 and amiodarone. Intubated. UDS + benzos. Echo showed EF 30-35%, RV mildly reduced. R/LHC showed mild nonobstructive CAD (30% ost-prox LAD), severely elevated pre capillary filling pressures, hemodynamics (PAPi, CVP/PCWP ratio) consistent with suboptimal RV function -> treated with milrinone. Felt to have acute biventricular systolic HF with RV dominant shock. cMRI 08/29/23  EF 40%, no LGE, mild decreased in RVSF. Underwent ICD implant by EP 08/30/23, also treated with amiodarone. Discharged to CIR on 09/03/23. Cardiology asked to re-sign on for AKI and hyperkalemia.   Assessment & Plan    1. 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 with hyperkalemia superimposed on recent history  of OOH VT/VF arrest treated with HF team with milrinone, amiodarone, and subsequent ICD implant, with soft BP - clinically he looks great without any complaints, reported the present edema is chronic going back many years s/p ankle injuries - baseline Cr prior to admission 1.1-1.3 so suspect some degree of CKD stage 2 at baseline - Entresto and spironolactone held starting 09/09/23 due to AKI and hyperkalemia, given gentle IVF - Cr remains elevated above baseline but potassium improved, soft BP improved as well - will review med recs with MD, possible eventual resumption of Entresto without spironolactone  2. VF/VT - Dr. Lavonne Prairie plans to review amiodarone dosing at DC - MD to advise on driving restriction (suspect should not be allowed for at least 6 months) - I discussed wound check/EP f/u with Leighton Punches, EP APP - she will do his wound check while he is here in CIR and she will ensure he has EP f/u - also has HF TOC f/u scheduled 4/21   3. Hypothyroidism - recent TSH 3/31 wnl   4. Transaminits - LFTs intermittently elevated though remain improved from presentation, follow, will defer further eval to primary team   5. DM1 - per primary team   6. Mild CAD - ? consider statin therapy, also note mild transaminitis - started on low dose ASA given CAD, DM  For questions or updates, please contact [facility] Please consult [email or web address] for contact info under Cardiology/STEMI.  Signed, Alexandria Angel, PA-C 09/10/2023, 7:49 AM    History and all data above reviewed.  Patient examined.  I agree with the findings as above.  He is ready to go home.  No pain.  No SOB.  Working with PT on stairs.   The patient exam reveals COR:RRR  ,  Lungs: Clear  ,  Abd: Positive bowel sounds, no rebound no guarding, Ext mild edema at the ankles.   .  All available labs, radiology testing, previous records reviewed. Agree with documented assessment and plan.   Acute systolic HF:    Creat is improved off of  spironolactone and off of Entresto. Will follow.  For now I will start Bidil and we can consider retrialing Entresto as an out patient once his creat has returned to baseline.   Royston Cornea Heraclio Seidman  12:34 PM  09/10/2023

## 2023-09-11 ENCOUNTER — Other Ambulatory Visit (HOSPITAL_COMMUNITY): Payer: Self-pay

## 2023-09-11 ENCOUNTER — Ambulatory Visit (INDEPENDENT_AMBULATORY_CARE_PROVIDER_SITE_OTHER): Payer: Medicare PPO

## 2023-09-11 VITALS — Ht 73.0 in | Wt 208.0 lb

## 2023-09-11 DIAGNOSIS — R41840 Attention and concentration deficit: Secondary | ICD-10-CM | POA: Diagnosis not present

## 2023-09-11 DIAGNOSIS — Z0000 Encounter for general adult medical examination without abnormal findings: Secondary | ICD-10-CM | POA: Diagnosis not present

## 2023-09-11 DIAGNOSIS — G35 Multiple sclerosis: Secondary | ICD-10-CM | POA: Diagnosis not present

## 2023-09-11 DIAGNOSIS — G9340 Encephalopathy, unspecified: Secondary | ICD-10-CM | POA: Diagnosis not present

## 2023-09-11 DIAGNOSIS — G8220 Paraplegia, unspecified: Secondary | ICD-10-CM | POA: Diagnosis not present

## 2023-09-11 LAB — COMPREHENSIVE METABOLIC PANEL WITH GFR
ALT: 70 U/L — ABNORMAL HIGH (ref 0–44)
AST: 42 U/L — ABNORMAL HIGH (ref 15–41)
Albumin: 3.4 g/dL — ABNORMAL LOW (ref 3.5–5.0)
Alkaline Phosphatase: 89 U/L (ref 38–126)
Anion gap: 7 (ref 5–15)
BUN: 24 mg/dL — ABNORMAL HIGH (ref 6–20)
CO2: 28 mmol/L (ref 22–32)
Calcium: 9.1 mg/dL (ref 8.9–10.3)
Chloride: 99 mmol/L (ref 98–111)
Creatinine, Ser: 1.6 mg/dL — ABNORMAL HIGH (ref 0.61–1.24)
GFR, Estimated: 52 mL/min — ABNORMAL LOW (ref >60)
Glucose, Bld: 111 mg/dL — ABNORMAL HIGH (ref 70–99)
Potassium: 5.1 mmol/L (ref 3.5–5.1)
Sodium: 134 mmol/L — ABNORMAL LOW (ref 135–145)
Total Bilirubin: 0.5 mg/dL (ref 0.0–1.2)
Total Protein: 6.2 g/dL — ABNORMAL LOW (ref 6.5–8.1)

## 2023-09-11 MED ORDER — AMIODARONE HCL 200 MG PO TABS
400.0000 mg | ORAL_TABLET | Freq: Every day | ORAL | 0 refills | Status: DC
  Filled 2023-09-11: qty 60, 30d supply, fill #0

## 2023-09-11 MED ORDER — AMIODARONE HCL 200 MG PO TABS
200.0000 mg | ORAL_TABLET | Freq: Every day | ORAL | 0 refills | Status: DC
  Filled 2023-09-11: qty 30, 30d supply, fill #0

## 2023-09-11 MED ORDER — ISOSORB DINITRATE-HYDRALAZINE 20-37.5 MG PO TABS
1.0000 | ORAL_TABLET | Freq: Two times a day (BID) | ORAL | 0 refills | Status: DC
  Filled 2023-09-11: qty 60, 30d supply, fill #0

## 2023-09-11 MED ORDER — AMIODARONE HCL 200 MG PO TABS: 200.0000 mg | ORAL_TABLET | Freq: Every day | ORAL | Status: DC

## 2023-09-11 MED ORDER — INSULIN PUMP: 1.0000 | Freq: Three times a day (TID) | SUBCUTANEOUS | Status: AC

## 2023-09-11 NOTE — Progress Notes (Signed)
 PROGRESS NOTE   Subjective/Complaints:  Appreciate cardiology , EP and Neuropsych notes  Had a good night Reviewed multiple MD f/u with pt  Review of systems: Denies chest pains, shortness of breath, nausea vomiting diarrhea constipation   Objective:   DG CHEST PORT 1 VIEW Result Date: 09/10/2023 CLINICAL DATA:  [id number] Status post implantation of automatic cardioverter/defibrillator (AICD) 045409 EXAM: PORTABLE CHEST 1 VIEW COMPARISON:  09/01/2023. FINDINGS: Bilateral lung fields are clear. Bilateral costophrenic angles are clear. Left-sided ICD noted.  No pneumothorax. No acute osseous abnormalities. The soft tissues are within normal limits. IMPRESSION: No acute cardiopulmonary abnormality. Electronically Signed   By: Beula Brunswick M.D.   On: 09/10/2023 13:27   Recent Labs    09/10/23 0524  WBC 8.5  HGB 13.4  HCT 40.0  PLT 293    Recent Labs    09/10/23 0524 09/11/23 0515  NA 134* 134*  K 4.9 5.1  CL 101 99  CO2 26 28  GLUCOSE 159* 111*  BUN 23* 24*  CREATININE 1.60* 1.60*  CALCIUM 8.6* 9.1     Intake/Output Summary (Last 24 hours) at 09/11/2023 0827 Last data filed at 09/11/2023 0746 Gross per 24 hour  Intake 600 ml  Output 800 ml  Net -200 ml        Physical Exam: Vital Signs Blood pressure 127/69, pulse 74, temperature 98.5 F (36.9 C), temperature source Oral, resp. rate 18, height 6\' 1"  (1.854 m), weight 94.4 kg, SpO2 100%.  General: No acute distress Mood and affect are appropriate Heart: Regular rate and rhythm no rubs murmurs or extra sounds Lungs: Clear to auscultation, breathing unlabored, no rales or wheezes Abdomen: Positive bowel sounds, soft nontender to palpation, nondistended Extremities: 2+ pre tib edema  Skin: Stasis dermatitis BLE,  steristrips over ICD site, no bleeding, no erythema around dressing  Neurologic: Cranial nerves II through XII intact, motor strength is 5/5 in  bilateral deltoid, bicep, tricep, grip, 2-hip flexor and abd/add , 3-knee extensors, traceankle dorsiflexor and plantar flexor Sensory exam normal sensation to light touch and proprioception in bilateral upper and lower extremities Cerebellar no dysmetria in UEs  Musculoskeletal: . No joint swelling, no tenderness Left chest no pain with shoulder flexion and abd,     Assessment/Plan: 1. 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 Stable for D/C today F/u PCP in 2-3 weeks F/u Neuro Dr Godwin Lat 01/08/24 F/u EP 10/15/23 F/u Endo 10/22/23 F/u Cardiology  F/u PM&R 3-4 weeks See D/C summary See D/C instructions   Care Tool:  Bathing    Body parts bathed by patient: Right arm, Left arm, Abdomen, Chest, Front perineal area, Right upper leg, Left upper leg, Face, Left lower leg, Right lower leg, Buttocks   Body parts bathed by helper: Buttocks     Bathing assist Assist Level: Independent with assistive device     Upper Body Dressing/Undressing Upper body dressing   What is the patient wearing?: Pull over shirt    Upper body assist Assist Level: Independent    Lower Body Dressing/Undressing Lower body dressing      What is the patient wearing?: Underwear/pull up, Pants  Lower body assist Assist for lower body dressing: Independent with assitive device     Toileting Toileting    Toileting assist Assist for toileting: Independent with assistive device     Transfers Chair/bed transfer  Transfers assist     Chair/bed transfer assist level: Independent with assistive device     Locomotion Ambulation   Ambulation assist      Assist level: Supervision/Verbal cueing Assistive device: Walker-rolling Max distance: 142 ft   Walk 10 feet activity   Assist     Assist level: Supervision/Verbal cueing Assistive device: Walker-rolling   Walk 50 feet activity   Assist Walk 50 feet with 2 turns activity did not  occur: Safety/medical concerns  Assist level: Supervision/Verbal cueing Assistive device: Walker-rolling    Walk 150 feet activity   Assist Walk 150 feet activity did not occur: Safety/medical concerns  Assist level: Supervision/Verbal cueing Assistive device: Walker-rolling    Walk 10 feet on uneven surface  activity   Assist Walk 10 feet on uneven surfaces activity did not occur: Safety/medical concerns   Assist level: Supervision/Verbal cueing Assistive device: Walker-rolling   Wheelchair     Assist Is the patient using a wheelchair?: Yes Type of Wheelchair: Manual    Wheelchair assist level: Set up assist Max wheelchair distance: 75 ft    Wheelchair 50 feet with 2 turns activity    Assist        Assist Level: Independent   Wheelchair 150 feet activity     Assist      Assist Level: Independent   Blood pressure 127/69, pulse 74, temperature 98.5 F (36.9 C), temperature source Oral, resp. rate 18, height 6\' 1"  (1.854 m), weight 94.4 kg, SpO2 100%.  Medical Problem List and Plan: 1. Functional deficits secondary to cardiac arrest/V-fib/V. tach with anoxic encephalopathy             -patient may shower             -ELOS/Goals:SHould be ready 4/16 would like K+, BUN and Creat to trend positively , Cards to re eval meds today  2.  Antithrombotics: -DVT/anticoagulation:  Pharmaceutical: Heparin             -antiplatelet therapy: N/A 3. Pain Management: Tylenol as needed 4. Mood/Behavior/Sleep: Provide emotional support             -antipsychotic agents: N/A 5. Neuropsych/cognition: This patient is not capable of making decisions on his own behalf. 6. Skin/Wound Care: Routine skin checks-ICD dressing removed , 2 layer steristrips remain to flake off in shower  7. Fluids/Electrolytes/Nutrition: Routine in and outs with follow-up chemistries             -pt appears to have good appetite 8.  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 Chronic LE edema  Status post ICD 08/30/2023.   -continue Amiodarone 400 mg daily, Toprol-XL 25 mg daily, Entresto 49-51 mg twice daily (hold pending cards re eval) , Aldactone 25 mg daily (Hold pending cards re eval) BiDil started per cards  Has chronic lower ext edema (paraplegia plus hx of bilateral ankle fx) so this may not be a good clinical indicator for CHF in this pt Have held aldactone, give , low vol bolus today , recheck labs in am 9.  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  Latest hemoglobin A1c 7.3. continue insulin pump   10.  Hypothyroidism.  continue Synthroid  11.  History of multiple sclerosis with paraparesis  as well as truncal weakness.  Followed by neurology services Dr. Godwin Lat..  Continue Dalfampridine 10 mg twice daily as well as Requip for restless legs             -pt states that restless legs have been better while in hospital May need orthotic management while at the hospital.  However has been noncompliant with his current orthoses.  -D3 supplement started, continue  12.  AKI on CKD stage III. Monitor creatinine weekly    Latest Ref Rng & Units 09/11/2023    5:15 AM 09/10/2023    5:24 AM 09/09/2023    3:57 PM  BMP  Glucose 70 - 99 mg/dL 161  096  90   BUN 6 - 20 mg/dL 24  23  29    Creatinine 0.61 - 1.24 mg/dL 0.45  4.09  8.11   Sodium 135 - 145 mmol/L 134  134  133   Potassium 3.5 - 5.1 mmol/L 5.1  4.9  4.9   Chloride 98 - 111 mmol/L 99  101  97   CO2 22 - 32 mmol/L 28  26  28    Calcium 8.9 - 10.3 mg/dL 9.1  8.6  9.4   BUN, Creat up, K+ up, likely poor intake (<580mL recorded in) plus aldactone, will stop aldactone cards re eval appreciated  13.  Elevated transaminase on 4/9 after normalizing 5 d ago repeat 4/14  14. Hypotension: decrease lopressor to 12.5mg  , (entresto and aldactone held x 1 d, IVF bolus 4/14) improved, continue this dose Vitals:   09/10/23 1945 09/11/23 0522  BP: 115/65  127/69  Pulse: 81 74  Resp: 18 18  Temp: 98.5 F (36.9 C) 98.5 F (36.9 C)  SpO2: 98% 100%      LOS: 8 days A FACE TO FACE EVALUATION WAS PERFORMED  Genetta Kenning 09/11/2023, 8:27 AM

## 2023-09-11 NOTE — Progress Notes (Signed)
 Patient ID: Johnathan Ross, male   DOB: [birth year], 52 y.o.   MRN: [id number]   SW faxed outpatient PT referral to [facility].   Norval Been, MSW, LCSW Office: [phone] Cell: [phone] Fax: [phone]

## 2023-09-11 NOTE — Progress Notes (Signed)
 Subjective:   Johnathan Ross is a 53 y.o. who presents for a Medicare Wellness preventive visit.  Visit Complete: Virtual I connected with  [name] on 09/11/23 by a audio enabled telemedicine application and verified that I am speaking with the correct person using two identifiers.  Patient Location: Home  Provider Location: Office/Clinic  I discussed the limitations of evaluation and management by telemedicine. The patient expressed understanding and agreed to proceed.  Vital Signs: Because this visit was a virtual/telehealth visit, some criteria may be missing or patient reported. Any vitals not documented were not able to be obtained and vitals that have been documented are patient reported.  VideoDeclined- This patient declined Librarian, academic. Therefore the visit was completed with audio only.  Persons Participating in Visit: Patient.  AWV Questionnaire: No: Patient Medicare AWV questionnaire was not completed prior to this visit.  Cardiac Risk Factors include: advanced age (>20men, >26 women);hypertension;dyslipidemia;diabetes mellitus;male gender     Objective:    Today's Vitals   09/11/23 0917  Weight: 208 lb (94.3 kg)  Height: 6\' 1"  (1.854 m)   Body mass index is 27.44 kg/m.     09/11/2023    9:15 AM 09/03/2023    5:00 PM 08/29/2023    6:00 PM 07/04/2023   10:06 AM 03/22/2023    9:40 AM 01/15/2023   10:29 AM 09/10/2022    9:25 AM  Advanced Directives  Does Patient Have a Medical Advance Directive? No No No No No No No  Would patient like information on creating a medical advance directive? No - Patient declined No - Patient declined No - Patient declined No - Patient declined No - Patient declined No - Patient declined     Current Medications (verified) Facility-Administered Encounter Medications as of 09/11/2023  Medication   acetaminophen (TYLENOL) tablet 325-650 mg   amiodarone (PACERONE) tablet 400 mg   aspirin EC  tablet 81 mg   cholecalciferol (VITAMIN D3) 25 MCG (1000 UNIT) tablet 1,000 Units   Dalfampridine ER 10 mg tablet - patient's own supply   feeding supplement (ENSURE ENLIVE / ENSURE PLUS) liquid 237 mL   insulin pump   isosorbide-hydrALAZINE (BIDIL) 20-37.5 MG per tablet 1 tablet   levothyroxine (SYNTHROID) tablet 125 mcg   magic mouthwash w/lidocaine   metoprolol succinate (TOPROL-XL) 24 hr tablet 12.5 mg   Oral care mouth rinse   polyethylene glycol (MIRALAX / GLYCOLAX) packet 17 g   rOPINIRole (REQUIP) tablet 0.25 mg   senna-docusate (Senokot-S) tablet 2 tablet   Outpatient Encounter Medications as of 09/11/2023  Medication Sig   acetaminophen (TYLENOL) 325 MG tablet Take 1-2 tablets (325-650 mg total) by mouth every 4 (four) hours as needed for mild pain (pain score 1-3).   amiodarone (PACERONE) 200 MG tablet Take 2 tablets (400 mg total) by mouth daily.   aspirin EC 81 MG tablet Take 1 tablet (81 mg total) by mouth daily. Swallow whole.   dalfampridine 10 MG TB12 Take 1 tablet (10 mg total) by mouth 2 (two) times daily.   feeding supplement (ENSURE ENLIVE / ENSURE PLUS) LIQD Take 237 mLs by mouth 3 (three) times daily between meals.   insulin aspart (NOVOLOG) 100 UNIT/ML injection Inject 0-5 Units into the skin at bedtime.   insulin aspart (NOVOLOG) 100 UNIT/ML injection Inject 0-9 Units into the skin 3 (three) times daily with meals.   insulin aspart (NOVOLOG) 100 UNIT/ML injection Inject 4 Units into the skin 3 (three) times daily  with meals.   insulin glargine-yfgn (SEMGLEE) 100 UNIT/ML injection Inject 0.35 mLs (35 Units total) into the skin daily.   Insulin Human (INSULIN PUMP) SOLN Inject 1 each into the skin 3 times daily with meals, bedtime and 2 AM.   isosorbide-hydrALAZINE (BIDIL) 20-37.5 MG tablet Take 1 tablet by mouth 2 (two) times daily.   levocetirizine (XYZAL) 5 MG tablet TAKE 1 TABLET BY MOUTH EVERY DAY IN THE EVENING   levothyroxine (SYNTHROID) 125 MCG tablet Take 1  tablet (125 mcg total) by mouth daily at 6 (six) AM.   magic mouthwash w/lidocaine SOLN Take 5 mLs by mouth 4 (four) times daily as needed for mouth pain. Suspension contains equal amounts of Maalox Extra Strength, nystatin, diphenhydramine and lidocaine.   metoprolol succinate (TOPROL-XL) 25 MG 24 hr tablet Take 1 tablet (25 mg total) by mouth daily.   [Paused] NOVOLOG 100 UNIT/ML injection USE UP TO 80 UNITS IN INSULIN PUMP DAILY-DX CODE E10.8   polyethylene glycol (MIRALAX / GLYCOLAX) 17 g packet Take 17 g by mouth 2 (two) times daily.   polyethylene glycol (MIRALAX / GLYCOLAX) 17 g packet Take 17 g by mouth daily.   rOPINIRole (REQUIP) 0.25 MG tablet Take 1 tablet (0.25 mg total) by mouth at bedtime.   sacubitril-valsartan (ENTRESTO) 49-51 MG Take 1 tablet by mouth 2 (two) times daily.   senna-docusate (SENOKOT-S) 8.6-50 MG tablet Take 2 tablets by mouth 2 (two) times daily.   simvastatin (ZOCOR) 20 MG tablet Take 1 tablet (20 mg total) by mouth daily at 6 PM.   spironolactone (ALDACTONE) 25 MG tablet Take 1 tablet (25 mg total) by mouth daily.   vitamin D3 (CHOLECALCIFEROL) 25 MCG tablet Take 1 tablet (1,000 Units total) by mouth daily.   [DISCONTINUED] amiodarone (PACERONE) 400 MG tablet Take 1 tablet (400 mg total) by mouth daily.   [DISCONTINUED] metoprolol succinate (TOPROL-XL) 25 MG 24 hr tablet Take 0.5 tablets (12.5 mg total) by mouth daily.   [DISCONTINUED] SYNTHROID 125 MCG tablet Take 1 tablet by mouth 6 days per week.    Allergies (verified) Zestril [lisinopril] and Ivp dye [iodinated contrast media]   History: Past Medical History:  Diagnosis Date   Hyperlipidemia    Hypertension    Hypogonadism in male 11/01/2016   Hypothyroidism    Multiple sclerosis (HCC)    Neuromuscular disorder (HCC)    Proliferative diabetic retinopathy(362.02)    Type 1 diabetes mellitus (HCC) dx'd 1994   Vitamin D insufficiency 11/29/2016   Past Surgical History:  Procedure Laterality Date    EYE SURGERY Bilateral    "laser for diabetic retinopathy"   FRACTURE SURGERY     ICD IMPLANT N/A 08/30/2023   Procedure: ICD IMPLANT;  Surgeon: Regan Lemming, MD;  Location: [facility];  Service: Cardiovascular;  Laterality: N/A;   IR VENO/EXT/UNI LEFT  03/24/2020   OPEN REDUCTION INTERNAL FIXATION (ORIF) TIBIA/FIBULA FRACTURE Right 10/30/2016   ORIF ANKLE FRACTURE Left 2015   ORIF TIBIA FRACTURE Right 10/30/2016   Procedure: OPEN REDUCTION INTERNAL FIXATION (ORIF) TIBIA FIBULA FRACTURE;  Surgeon: Myrene Galas, MD;  Location: [facility] OR;  Service: Orthopedics;  Laterality: Right;   RETINAL LASER PROCEDURE Bilateral    "for diabetic retinopathy"   RIGHT/LEFT HEART CATH AND CORONARY ANGIOGRAPHY N/A 08/26/2023   Procedure: RIGHT/LEFT HEART CATH AND CORONARY ANGIOGRAPHY;  Surgeon: Dorthula Nettles, DO;  Location: [facility];  Service: Cardiovascular;  Laterality: N/A;   Family History  Problem Relation Age of Onset  Diabetes Sister    Colon polyps Maternal Uncle    Colon cancer Maternal Grandmother    Stomach cancer Cousin    Esophageal cancer Neg Hx    Social History   Socioeconomic History   Marital status: Single    Spouse name: 12   Number of children: 1   Years of education: Not on file   Highest education level: Not on file  Occupational History   Occupation: unemployed  Tobacco Use   Smoking status: Never    Passive exposure: Never   Smokeless tobacco: Never  Vaping Use   Vaping status: Never Used  Substance and Sexual Activity   Alcohol use: No   Drug use: No   Sexual activity: Not on file  Other Topics Concern   Not on file  Social History Narrative   Right handed   No caffeine    Lives with girlfriend   Social Drivers of Corporate investment banker Strain: Low Risk  (09/11/2023)   Overall Financial Resource Strain (CARDIA)    Difficulty of Paying Living Expenses: Not hard at all  Food Insecurity: No Food Insecurity (09/11/2023)   Hunger  Vital Sign    Worried About Running Out of Food in the Last Year: Never true    Ran Out of Food in the Last Year: Never true  Transportation Needs: No Transportation Needs (09/11/2023)   PRAPARE - Administrator, Civil Service (Medical): No    Lack of Transportation (Non-Medical): No  Physical Activity: Sufficiently Active (09/11/2023)   Exercise Vital Sign    Days of Exercise per Week: 5 days    Minutes of Exercise per Session: 30 min  Stress: No Stress Concern Present (09/11/2023)   [employer] of Occupational Health - Occupational Stress Questionnaire    Feeling of Stress : Not at all  Social Connections: Socially Isolated (09/11/2023)   Social Connection and Isolation Panel [NHANES]    Frequency of Communication with Friends and Family: More than three times a week    Frequency of Social Gatherings with Friends and Family: More than three times a week    Attends Religious Services: Never    Database administrator or Organizations: No    Attends Engineer, structural: Never    Marital Status: Never married    Tobacco Counseling Counseling given: No    Clinical Intake:  Pre-visit preparation completed: Yes  Pain : No/denies pain     BMI - recorded: 27.44 Nutritional Status: BMI 25 -29 Overweight Nutritional Risks: None Diabetes: Yes CBG done?: Yes CBG resulted in Enter/ Edit results?: Yes (fasting - 145) Did pt. bring in CBG monitor from home?: No  Lab Results  Component Value Date   HGBA1C 7.3 (H) 07/08/2023   HGBA1C 7.5 (H) 03/15/2023   HGBA1C 7.4 (H) 10/26/2022     How often do you need to have someone help you when you read instructions, pamphlets, or other written materials from your doctor or pharmacy?: 1 - Never  Interpreter Needed?: No  Information entered by :: Kandy Orris, CMA   Activities of Daily Living     09/11/2023    9:19 AM 09/03/2023    5:00 PM  In your present state of health, do you have any difficulty  performing the following activities:  Hearing? 0 0  Vision? 0 0  Difficulty concentrating or making decisions? 0 1  Walking or climbing stairs? 0   Dressing or bathing? 0   Doing errands,  shopping? 0   Preparing Food and eating ? N   Using the Toilet? N   In the past six months, have you accidently leaked urine? N   Do you have problems with loss of bowel control? N   Managing your Medications? N   Managing your Finances? N   Housekeeping or managing your Housekeeping? N     Patient Care Team: Etta Grandchild, MD as PCP - General (Internal Medicine) Dorthula Nettles, DO as PCP - Cardiology (Cardiology) Adalberto Cole., MD as Referring Physician (Neurology) Luciana Axe Alford Highland, MD as Consulting Physician (Ophthalmology)  Indicate any recent Medical Services you may have received from other than [facility] providers in the past year (date may be approximate).     Assessment:   This is a routine wellness examination for Ayush.  Hearing/Vision screen Hearing Screening - Comments:: Denies hearing difficulties   Vision Screening - Comments:: Declines vision concerns and up-to-date on eye exams with Dr Luciana Axe.   Goals Addressed               This Visit's Progress     Patient Stated (pt-stated)        Patient stated he'd liek to continue walking.       Depression Screen     09/11/2023    9:24 AM 09/10/2022    9:26 AM 08/02/2022    8:08 AM 08/01/2021    8:43 AM 09/21/2020    8:53 AM 02/11/2019    9:54 AM  PHQ 2/9 Scores  PHQ - 2 Score 0 0 0 0 0 0  PHQ- 9 Score 0         Fall Risk     09/11/2023    9:20 AM 09/10/2022    9:26 AM 08/02/2022    8:08 AM 09/07/2021    3:30 PM 08/01/2021    8:43 AM  Fall Risk   Falls in the past year? 0 0 0 0 0  Number falls in past yr: 0 0 0 0   Injury with Fall? 0 0 0 0   Risk for fall due to : No Fall Risks Medication side effect;Impaired mobility;Impaired balance/gait No Fall Risks No Fall Risks   Follow up Falls prevention discussed;Falls  evaluation completed Falls prevention discussed;Education provided;Falls evaluation completed Falls evaluation completed Falls evaluation completed     MEDICARE RISK AT HOME:  Medicare Risk at Home Any stairs in or around the home?: No If so, are there any without handrails?: No Home free of loose throw rugs in walkways, pet beds, electrical cords, etc?: Yes Adequate lighting in your home to reduce risk of falls?: Yes Life alert?: No Use of a cane, walker or w/c?: Yes (walker) Grab bars in the bathroom?: No Shower chair or bench in shower?: Yes Elevated toilet seat or a handicapped toilet?: No  TIMED UP AND GO:  Was the test performed?  No  Cognitive Function: 6CIT completed        09/11/2023    9:21 AM 09/10/2022    9:27 AM 09/07/2021    3:33 PM  6CIT Screen  What Year? 0 points 0 points 0 points  What month? 0 points 0 points 0 points  What time? 0 points 0 points 0 points  Count back from 20 0 points 0 points 0 points  Months in reverse 0 points 0 points 0 points  Repeat phrase 0 points 8 points 0 points  Total Score 0 points 8 points 0 points  Immunizations Immunization History  Administered Date(s) Administered   Influenza, Seasonal, Injecte, Preservative Fre 06/17/2023   Influenza,inj,Quad PF,6+ Mos 06/22/2013, 04/14/2014, 02/01/2015, 02/15/2016, 03/21/2017, 04/02/2018, 02/11/2019, 03/01/2020, 05/24/2021, 08/02/2022   Influenza-Unspecified 06/22/2013, 04/14/2014, 02/01/2015, 02/15/2016, 03/21/2017, 04/02/2018, 02/11/2019   Moderna Sars-Covid-2 Vaccination 01/31/2020, 02/28/2020   Pneumococcal Polysaccharide-23 03/01/2020    Screening Tests Health Maintenance  Topic Date Due   COVID-19 Vaccine (3 - 2024-25 season) 01/27/2023   FOOT EXAM  08/02/2023   OPHTHALMOLOGY EXAM  08/07/2023   Zoster Vaccines- Shingrix (1 of 2) 09/15/2023 (Originally 08/06/2022)   Pneumococcal Vaccine 36-23 Years old (2 of 2 - PCV) 06/16/2024 (Originally 03/01/2021)   INFLUENZA VACCINE   12/27/2023   HEMOGLOBIN A1C  01/05/2024   Diabetic kidney evaluation - Urine ACR  06/16/2024   Diabetic kidney evaluation - eGFR measurement  09/10/2024   Medicare Annual Wellness (AWV)  09/10/2024   Colonoscopy  11/23/2031   Hepatitis C Screening  Completed   HIV Screening  Completed   HPV VACCINES  Aged Out   Meningococcal B Vaccine  Aged Out   DTaP/Tdap/Td  Discontinued    Health Maintenance  Health Maintenance Due  Topic Date Due   COVID-19 Vaccine (3 - 2024-25 season) 01/27/2023   FOOT EXAM  08/02/2023   OPHTHALMOLOGY EXAM  08/07/2023   Health Maintenance Items Addressed: 09/11/2023   Additional Screening:  Vision Screening: Recommended annual ophthalmology exams for early detection of glaucoma and other disorders of the eye.  Dental Screening: Recommended annual dental exams for proper oral hygiene  Community Resource Referral / Chronic Care Management: CRR required this visit?  No   CCM required this visit?  No     Plan:     I have personally reviewed and noted the following in the patient's chart:   Medical and social history Use of alcohol, tobacco or illicit drugs  Current medications and supplements including opioid prescriptions. Patient is not currently taking opioid prescriptions. Functional ability and status Nutritional status Physical activity Advanced directives List of other physicians Hospitalizations, surgeries, and ER visits in previous 12 months Vitals Screenings to include cognitive, depression, and falls Referrals and appointments  In addition, I have reviewed and discussed with patient certain preventive protocols, quality metrics, and best practice recommendations. A written personalized care plan for preventive services as well as general preventive health recommendations were provided to patient.     Patria Bookbinder, CMA   09/11/2023   After Visit Summary: (MyChart) Due to this being a telephonic visit, the after visit summary  with patients personalized plan was offered to patient via MyChart   Notes: Nothing significant to report at this time.

## 2023-09-11 NOTE — Progress Notes (Signed)
 Inpatient Rehabilitation Care Coordinator Discharge Note   Patient Details  Name: AUBERT CHOYCE MRN: [id number] Date of Birth: 1970-12-07   Discharge location: D/c to home  Length of Stay: 7 days  Discharge activity level: Supervision  Home/community participation: Limited  Patient response WU:JWJXBJ Literacy - How often do you need to have someone help you when you read instructions, pamphlets, or other written material from your doctor or pharmacy?: Never  Patient response YN:WGNFAO Isolation - How often do you feel lonely or isolated from those around you?: Never  Services provided included: MD, RD, PT, OT, RN, TR, Pharmacy, Neuropsych, SW, CM  Financial Services:  Field seismologist Utilized: Private Insurance [employer]  Choices offered to/list presented to: Patient  Follow-up services arranged:  Outpatient    Outpatient Servicies: [facility] for PT only      Patient response to transportation need: Is the patient able to respond to transportation needs?: Yes In the past 12 months, has lack of transportation kept you from medical appointments or from getting medications?: No In the past 12 months, has lack of transportation kept you from meetings, work, or from getting things needed for daily living?: No   Patient/Family verbalized understanding of follow-up arrangements:  Yes  Individual responsible for coordination of the follow-up plan: contact pt  Confirmed correct DME delivered: Rennis Case 09/11/2023    Comments (or additional information):fam edu completed  Summary of Stay    Date/Time Discharge Planning CSW  09/10/23 1023 Pt will d/c to home with support from mother and sister; girlfriend works during the day. Fam edu on Tuesday 8am-11am. SW will confirm there are no barriers to discharge. AAC  09/04/23 0950 TBA. Per EMR, pt will d/c to home with support from mother and sister; girlfriend works during the day. SW will confirm there  are no barriers to discharge. AAC       Corlene Sabia A Brendolyn Callas

## 2023-09-11 NOTE — Progress Notes (Signed)
 Inpatient Rehabilitation Discharge Medication Review by a Pharmacist  A complete drug regimen review was completed for this patient to identify any potential clinically significant medication issues.  High Risk Drug Classes Is patient taking? Indication by Medication  Antipsychotic No   Anticoagulant No   Antibiotic No   Opioid No   Antiplatelet Yes bASA- CAD   Hypoglycemics/insulin Yes Insulin- T1DM  Vasoactive Medication Yes Toprol,  spironolactone, amiodarone, Bidil- HF/BP  Chemotherapy No   Other Yes Requip- RLS Dalfapridine- MS Synthroid - hypothyroidism APAP- pain  VitD- supplement  Miralax- bowel regimen  Xyzal- seasonal allergies  Zocor- HLD      Type of Medication Issue Identified Description of Issue Recommendation(s)  Drug Interaction(s) (clinically significant)     Duplicate Therapy     Allergy     No Medication Administration End Date     Incorrect Dose     Additional Drug Therapy Needed     Significant med changes from prior encounter (inform family/care partners about these prior to discharge). Entresto,spironolactone discontinued (AKI/hyperK)   Other  PTA meds: Baclofen Restart PTA meds when and if necessary during CIR admission or at time of discharge, if warranted     Clinically significant medication issues were identified that warrant physician communication and completion of prescribed/recommended actions by midnight of the next day:  No    Time spent performing this drug regimen review (minutes):  30   Chrystie Crass, PharmD Clinical Pharmacist  09/11/2023 8:20 AM

## 2023-09-11 NOTE — Progress Notes (Signed)
 Progress Note  Patient Name: Johnathan Ross Date of Encounter: 09/11/2023  Primary Cardiologist:   Dorthula Nettles, DO   Subjective   No pain.  Ready to go home.  No SOB.   Inpatient Medications    Scheduled Meds:  [START ON 09/12/2023] amiodarone  200 mg Oral Daily   aspirin EC  81 mg Oral Daily   cholecalciferol  1,000 Units Oral Daily   dalfampridine  10 mg Oral BID   feeding supplement  237 mL Oral TID BM   insulin pump   Subcutaneous TID WC, HS, 0200   isosorbide-hydrALAZINE  1 tablet Oral BID   levothyroxine  125 mcg Oral Q0600   metoprolol succinate  12.5 mg Oral Daily   polyethylene glycol  17 g Oral BID   rOPINIRole  0.25 mg Oral QHS   senna-docusate  2 tablet Oral BID   Continuous Infusions:  PRN Meds: acetaminophen, magic mouthwash w/lidocaine, mouth rinse   Vital Signs    Vitals:   09/10/23 1326 09/10/23 1945 09/11/23 0500 09/11/23 0522  BP: 98/65 115/65  127/69  Pulse: 80 81  74  Resp: 17 18  18   Temp: 98.2 F (36.8 C) 98.5 F (36.9 C)  98.5 F (36.9 C)  TempSrc: Oral Oral  Oral  SpO2: 100% 98%  100%  Weight:   94.4 kg   Height:        Intake/Output Summary (Last 24 hours) at 09/11/2023 1040 Last data filed at 09/11/2023 0746 Gross per 24 hour  Intake 600 ml  Output 800 ml  Net -200 ml   Filed Weights   09/07/23 0500 09/10/23 0500 09/11/23 0500  Weight: 94 kg 94.6 kg 94.4 kg    Telemetry    NA - Personally Reviewed  ECG    NA - Personally Reviewed  Physical Exam   GEN: No acute distress.   Neck: No  JVD Cardiac: RRR, no murmurs, rubs, or gallops.  Respiratory: Clear  to auscultation bilaterally. GI: Soft, nontender, non-distended  MS:     Trace ankle edema; No deformity. Neuro:  Nonfocal  Psych: Normal affect   Labs    Chemistry Recent Labs  Lab 09/09/23 0534 09/09/23 1557 09/10/23 0524 09/11/23 0515  NA 132* 133* 134* 134*  K 5.5* 4.9 4.9 5.1  CL 98 97* 101 99  CO2 27 28 26 28   GLUCOSE 179* 90 159* 111*   BUN 34* 29* 23* 24*  CREATININE 1.75* 1.68* 1.60* 1.60*  CALCIUM 9.0 9.4 8.6* 9.1  PROT 6.4*  --  6.1* 6.2*  ALBUMIN 3.4*  --  3.4* 3.4*  AST 50*  --  42* 42*  ALT 82*  --  72* 70*  ALKPHOS 93  --  87 89  BILITOT 0.8  --  0.6 0.5  GFRNONAA 47* 49* 52* 52*  ANIONGAP 7 8 7 7      Hematology Recent Labs  Lab 09/10/23 0524  WBC 8.5  RBC 4.57  HGB 13.4  HCT 40.0  MCV 87.5  MCH 29.3  MCHC 33.5  RDW 14.4  PLT 293    Cardiac EnzymesNo results for input(s): "TROPONINI" in the last 168 hours. No results for input(s): "TROPIPOC" in the last 168 hours.   BNPNo results for input(s): "BNP", "PROBNP" in the last 168 hours.   DDimer No results for input(s): "DDIMER" in the last 168 hours.   Radiology    DG CHEST PORT 1 VIEW Result Date: 09/10/2023 CLINICAL DATA:  [id number] Status  post implantation of automatic cardioverter/defibrillator (AICD) [id number] EXAM: PORTABLE CHEST 1 VIEW COMPARISON:  09/01/2023. FINDINGS: Bilateral lung fields are clear. Bilateral costophrenic angles are clear. Left-sided ICD noted.  No pneumothorax. No acute osseous abnormalities. The soft tissues are within normal limits. IMPRESSION: No acute cardiopulmonary abnormality. Electronically Signed   By: Beula Brunswick M.D.   On: 09/10/2023 13:27    Cardiac Studies   Echo   1. No left ventricular thrombus is seen (Definity contrast used). Left  ventricular ejection fraction, by estimation, is 30 to 35%. The left  ventricle has moderately decreased function. The left ventricle  demonstrates global hypokinesis. Left ventricular   diastolic parameters are indeterminate.   2. Right ventricular systolic function is mildly reduced. The right  ventricular size is normal.   3. The mitral valve is normal in structure. No evidence of mitral valve  regurgitation. No evidence of mitral stenosis.   4. The aortic valve is tricuspid. Aortic valve regurgitation is not  visualized. No aortic stenosis is present.   Patient  Profile     53 y.o. male with DM1, hypothyroidism, HTN, multiple sclerosis, hyperlipidemia presented to ED 08/26/23 for OOH VT/VF arrest. Shocked 8 times, given epinephrine x2 and amiodarone. Intubated. UDS + benzos. Echo showed EF 30-35%, RV mildly reduced. R/LHC showed mild nonobstructive CAD (30% ost-prox LAD), severely elevated pre capillary filling pressures, hemodynamics (PAPi, CVP/PCWP ratio) consistent with suboptimal RV function -> treated with milrinone. Felt to have acute biventricular systolic HF with RV dominant shock. cMRI 08/29/23 EF 40%, no LGE, mild decreased in RVSF. Underwent ICD implant by EP 08/30/23, also treated with amiodarone. Discharged to CIR on 09/03/23. Cardiology asked to re-sign on for AKI and hyperkalemia.   Assessment & Plan    AKI:    Creat still slightly elevated.  He will need repeat labs at discharge.  He has follow up next week.  Avoid ARB/ARNi/spiro/ACE for now.   ntual resumption of Entresto without spironolactone   VF/VT ICD follow will be arranged.  OK to decrease to 200 mg amio at discharge.    Hypothyroidism Follow closely on amiodarone.    Transaminits Enzymes are still mildly elevated.  I anticipate that we will discontinue the amio at upcoming visits provided the rhythm is stable and no NSVT/VT   Mild CAD Continue ASA.  No statin given elevated liver enyzmes  Nonischemic cardiomyopathy: EF 30 - 35%.  Started low dose BiDil yesterday.    For questions or updates, please contact [facility] Please consult [email or web address] for contact info under Cardiology/STEMI.   Signed, Eilleen Grates, MD  09/11/2023, 10:40 AM

## 2023-09-11 NOTE — Patient Instructions (Addendum)
 Johnathan Ross , Thank you for taking time to come for your Medicare Wellness Visit. I appreciate your ongoing commitment to your health goals. Please review the following plan we discussed and let me know if I can assist you in the future.   Referrals/Orders/Follow-Ups/Clinician Recommendations: Aim for 30 minutes of exercise or brisk walking, 6-8 glasses of water, and 5 servings of fruits and vegetables each day.   This is a list of the screening recommended for you and due dates:  Health Maintenance  Topic Date Due   COVID-19 Vaccine (3 - 2024-25 season) 01/27/2023   Complete foot exam   08/02/2023   Eye exam for diabetics  08/07/2023   Zoster (Shingles) Vaccine (1 of 2) 09/15/2023*   Pneumococcal Vaccination (2 of 2 - PCV) 06/16/2024*   Flu Shot  12/27/2023   Hemoglobin A1C  01/05/2024   Yearly kidney health urinalysis for diabetes  06/16/2024   Yearly kidney function blood test for diabetes  09/10/2024   Medicare Annual Wellness Visit  09/10/2024   Colon Cancer Screening  11/23/2031   Hepatitis C Screening  Completed   HIV Screening  Completed   HPV Vaccine  Aged Out   Meningitis B Vaccine  Aged Out   DTaP/Tdap/Td vaccine  Discontinued  *Topic was postponed. The date shown is not the original due date.    Advanced directives: (Declined) Advance directive discussed with you today. Even though you declined this today, please call our office should you change your mind, and we can give you the proper paperwork for you to fill out.  Next Medicare Annual Wellness Visit scheduled for next year: Yes

## 2023-09-13 ENCOUNTER — Telehealth (HOSPITAL_COMMUNITY): Payer: Self-pay

## 2023-09-13 NOTE — Telephone Encounter (Signed)
 Called to confirm/remind patient of their appointment at the [facility] on 09/16/23.   Appointment:   [] Confirmed  [x] Left mess   [] No answer/No voice mail  [] VM Full/unable to leave message  [] Phone not in service  And to bring in all medications and/or complete list.

## 2023-09-16 ENCOUNTER — Encounter (HOSPITAL_COMMUNITY): Payer: Self-pay

## 2023-09-16 ENCOUNTER — Ambulatory Visit (HOSPITAL_COMMUNITY)
Admission: RE | Admit: 2023-09-16 | Discharge: 2023-09-16 | Disposition: A | Discharge status: Home / Self Care | Source: Ambulatory Visit | Attending: Family Medicine

## 2023-09-16 VITALS — BP 110/68 | HR 75 | Wt 213.2 lb

## 2023-09-16 DIAGNOSIS — I5022 Chronic systolic (congestive) heart failure: Secondary | ICD-10-CM | POA: Diagnosis not present

## 2023-09-16 DIAGNOSIS — Z794 Long term (current) use of insulin: Secondary | ICD-10-CM | POA: Diagnosis not present

## 2023-09-16 DIAGNOSIS — R5381 Other malaise: Secondary | ICD-10-CM | POA: Diagnosis not present

## 2023-09-16 DIAGNOSIS — G4733 Obstructive sleep apnea (adult) (pediatric): Secondary | ICD-10-CM | POA: Insufficient documentation

## 2023-09-16 DIAGNOSIS — I2510 Atherosclerotic heart disease of native coronary artery without angina pectoris: Secondary | ICD-10-CM | POA: Diagnosis not present

## 2023-09-16 DIAGNOSIS — G35 Multiple sclerosis: Secondary | ICD-10-CM | POA: Insufficient documentation

## 2023-09-16 DIAGNOSIS — I4901 Ventricular fibrillation: Secondary | ICD-10-CM | POA: Insufficient documentation

## 2023-09-16 DIAGNOSIS — E109 Type 1 diabetes mellitus without complications: Secondary | ICD-10-CM | POA: Insufficient documentation

## 2023-09-16 DIAGNOSIS — Z8249 Family history of ischemic heart disease and other diseases of the circulatory system: Secondary | ICD-10-CM | POA: Insufficient documentation

## 2023-09-16 DIAGNOSIS — I472 Ventricular tachycardia, unspecified: Secondary | ICD-10-CM | POA: Diagnosis not present

## 2023-09-16 DIAGNOSIS — Z79899 Other long term (current) drug therapy: Secondary | ICD-10-CM | POA: Insufficient documentation

## 2023-09-16 DIAGNOSIS — I110 Hypertensive heart disease with heart failure: Secondary | ICD-10-CM | POA: Insufficient documentation

## 2023-09-16 DIAGNOSIS — I5082 Biventricular heart failure: Secondary | ICD-10-CM | POA: Diagnosis not present

## 2023-09-16 DIAGNOSIS — I469 Cardiac arrest, cause unspecified: Secondary | ICD-10-CM

## 2023-09-16 DIAGNOSIS — Z833 Family history of diabetes mellitus: Secondary | ICD-10-CM | POA: Diagnosis not present

## 2023-09-16 DIAGNOSIS — E785 Hyperlipidemia, unspecified: Secondary | ICD-10-CM | POA: Insufficient documentation

## 2023-09-16 DIAGNOSIS — Z8674 Personal history of sudden cardiac arrest: Secondary | ICD-10-CM | POA: Insufficient documentation

## 2023-09-16 DIAGNOSIS — E039 Hypothyroidism, unspecified: Secondary | ICD-10-CM | POA: Diagnosis not present

## 2023-09-16 DIAGNOSIS — Z95810 Presence of automatic (implantable) cardiac defibrillator: Secondary | ICD-10-CM | POA: Insufficient documentation

## 2023-09-16 LAB — BASIC METABOLIC PANEL WITH GFR
Anion gap: 9 (ref 5–15)
BUN: 19 mg/dL (ref 6–20)
CO2: 26 mmol/L (ref 22–32)
Calcium: 9 mg/dL (ref 8.9–10.3)
Chloride: 104 mmol/L (ref 98–111)
Creatinine, Ser: 1.25 mg/dL — ABNORMAL HIGH (ref 0.61–1.24)
GFR, Estimated: >60 mL/min (ref >60)
Glucose, Bld: 83 mg/dL (ref 70–99)
Potassium: 4.2 mmol/L (ref 3.5–5.1)
Sodium: 139 mmol/L (ref 135–145)

## 2023-09-16 LAB — BRAIN NATRIURETIC PEPTIDE: B Natriuretic Peptide: 164.1 pg/mL — ABNORMAL HIGH (ref 0.0–100.0)

## 2023-09-16 MED ORDER — LOSARTAN POTASSIUM 25 MG PO TABS: 12.5000 mg | ORAL_TABLET | Freq: Every day | ORAL | 3 refills | Status: DC

## 2023-09-16 MED ORDER — ISOSORB DINITRATE-HYDRALAZINE 20-37.5 MG PO TABS: 0.5000 | ORAL_TABLET | Freq: Three times a day (TID) | ORAL | 6 refills | Status: DC

## 2023-09-16 NOTE — Progress Notes (Signed)
 ADVANCED HF CLINIC CONSULT NOTE  Primary Care: Arcadio Knuckles, MD HF Cardiologist: Alwin Baars, DO  HPI: Johnathan Ross is a 53 y.o. male with a hx of DM1, hypothyroidism, HTN, multiple sclerosis, hyperlipidemia and a new diagnosis of systolic heart failure.   Admitted 07/2023 for VF arrest. Shocked 8 times, given epinephrine x 2 and amiodarone . Intubated. HsTroponin 290>>150. Post arrest ECG showed NSR, low voltage, no STE.  UDS + Benzos. Echo showed EF 30-35%, no LVH. RV mildly reduced. Underwent R/LHC showing mild nonobstructive CAD, severely elevated pre capillary filling pressures. Started on milrinone  gtts as hemodynamics consistent with RV failure. cMRI showed LVEF 40%, no RWMA, GHK, RVEF 45%, no significant regurgitation seen in all valves. No pericardial effusion. EEG showed moderate to severe diffuse encephalopathy. No seizures or epileptiform discharges. Underwent ICD implantation. Drips weaned and GDMT titrated, he was discharged to CIR for therapy, weight 222 lbs. CIR course complicated by AKI and hypotension.  Today he returns for post hospital HF follow up. Overall feeling fine. Motivated to get strength back. Walking with walker. Working with PT for ankle fractures. Denies increasing SOB, palpitations, abnormal bleeding, CP, dizziness, edema, or PND/Orthopnea. Appetite ok. No fever or chills. Weight at home 208 pounds. Taking all medications. Mild OSA, no tobacco/ETOH/drug use.  Family Hx: Father with MI & CAD    Cardiac Studies - cMRI (4/25): LVEF 40%, no RWMA, GHK, RVEF 45%, no significant regurgitation seen in all valves.  - R/LHC (3/25, on milrinone  0.25): RA 12, PA 38/20 (26), PCWP 14, CO/CI (Fick) 4.3/1.94, PVR 2.8 WU, PAPi 1.5 Mild, non-obstructive CAD.  - Echo (3/25): EF 30-35%, RV mildly reduced   Past Medical History:  Diagnosis Date   Hyperlipidemia    Hypertension    Hypogonadism in male 11/01/2016   Hypothyroidism    Multiple sclerosis (HCC)     Neuromuscular disorder (HCC)    Proliferative diabetic retinopathy(362.02)    Type 1 diabetes mellitus (HCC) dx'd 1994   Vitamin D  insufficiency 11/29/2016    Current Outpatient Medications  Medication Sig Dispense Refill   acetaminophen  (TYLENOL ) 325 MG tablet Take 1-2 tablets (325-650 mg total) by mouth every 4 (four) hours as needed for mild pain (pain score 1-3).     amiodarone  (PACERONE ) 200 MG tablet Take 1 tablet (200 mg total) by mouth daily. 30 tablet 0   aspirin  EC 81 MG tablet Take 1 tablet (81 mg total) by mouth daily. Swallow whole.     dalfampridine  10 MG TB12 Take 1 tablet (10 mg total) by mouth 2 (two) times daily. 180 tablet 3   Insulin  Human (INSULIN  PUMP) SOLN Inject 1 each into the skin 3 times daily with meals, bedtime and 2 AM.     isosorbide -hydrALAZINE  (BIDIL ) 20-37.5 MG tablet Take 1 tablet by mouth 2 (two) times daily. 60 tablet 0   levocetirizine (XYZAL ) 5 MG tablet TAKE 1 TABLET BY MOUTH EVERY DAY IN THE EVENING 90 tablet 1   levothyroxine  (SYNTHROID ) 125 MCG tablet Take 1 tablet (125 mcg total) by mouth daily at 6 (six) AM. 30 tablet 0   polyethylene glycol (MIRALAX  / GLYCOLAX ) 17 g packet Take 17 g by mouth daily.     rOPINIRole  (REQUIP ) 0.25 MG tablet Take 1 tablet (0.25 mg total) by mouth at bedtime. 30 tablet 0   senna-docusate (SENOKOT-S) 8.6-50 MG tablet Take 2 tablets by mouth 2 (two) times daily.     simvastatin  (ZOCOR ) 20 MG tablet Take 1 tablet (20 mg  total) by mouth daily at 6 PM. 30 tablet 0   vitamin D3 (CHOLECALCIFEROL ) 25 MCG tablet Take 1 tablet (1,000 Units total) by mouth daily. 30 tablet 0   No current facility-administered medications for this encounter.    Allergies  Allergen Reactions   Zestril  [Lisinopril ] Cough   Ivp Dye [Iodinated Contrast Media] Hives and Itching      Social History   Socioeconomic History   Marital status: Single    Spouse name: 12   Number of children: 1   Years of education: Not on file   Highest  education level: Not on file  Occupational History   Occupation: unemployed  Tobacco Use   Smoking status: Never    Passive exposure: Never   Smokeless tobacco: Never  Vaping Use   Vaping status: Never Used  Substance and Sexual Activity   Alcohol use: No   Drug use: No   Sexual activity: Not on file  Other Topics Concern   Not on file  Social History Narrative   Right handed   No caffeine    Lives with girlfriend   Social Drivers of Corporate investment banker Strain: Low Risk  (09/11/2023)   Overall Financial Resource Strain (CARDIA)    Difficulty of Paying Living Expenses: Not hard at all  Food Insecurity: No Food Insecurity (09/11/2023)   Hunger Vital Sign    Worried About Running Out of Food in the Last Year: Never true    Ran Out of Food in the Last Year: Never true  Transportation Needs: No Transportation Needs (09/11/2023)   PRAPARE - Administrator, Civil Service (Medical): No    Lack of Transportation (Non-Medical): No  Physical Activity: Sufficiently Active (09/11/2023)   Exercise Vital Sign    Days of Exercise per Week: 5 days    Minutes of Exercise per Session: 30 min  Stress: No Stress Concern Present (09/11/2023)   [employer] of Occupational Health - Occupational Stress Questionnaire    Feeling of Stress : Not at all  Social Connections: Socially Isolated (09/11/2023)   Social Connection and Isolation Panel [NHANES]    Frequency of Communication with Friends and Family: More than three times a week    Frequency of Social Gatherings with Friends and Family: More than three times a week    Attends Religious Services: Never    Database administrator or Organizations: No    Attends Banker Meetings: Never    Marital Status: Never married  Intimate Partner Violence: Not At Risk (09/11/2023)   Humiliation, Afraid, Rape, and Kick questionnaire    Fear of Current or Ex-Partner: No    Emotionally Abused: No    Physically Abused: No     Sexually Abused: No      Family History  Problem Relation Age of Onset   Diabetes Sister    Colon polyps Maternal Uncle    Colon cancer Maternal Grandmother    Stomach cancer Cousin    Esophageal cancer Neg Hx    Wt Readings from Last 3 Encounters:  09/16/23 96.7 kg (213 lb 3.2 oz)  09/11/23 94.4 kg (208 lb 1.8 oz)  09/11/23 94.3 kg (208 lb)   BP 110/68   Pulse 75   Wt 96.7 kg (213 lb 3.2 oz)   SpO2 98%   BMI 28.13 kg/m   PHYSICAL EXAM: General:  NAD. No resp difficulty, arrived in [facility] HEENT: Normal Neck: Supple. No JVD. Cor: Regular rate & rhythm.  No rubs, gallops or murmurs. Lungs: Clear Abdomen: Soft, nontender, nondistended.  Extremities: No cyanosis, clubbing, rash, edema Neuro: Alert & oriented x 3, moves all 4 extremities w/o difficulty. Affect pleasant.  ECG (personally reviewed): NSR 78 bpm, low voltage  Device interrogation (personally reviewed): no CorVue data yet, no VT, 0% VP  ASSESSMENT & PLAN: 1. OOH VT/VF Arrest - ~ 20 min bystander CPR. ROSC after multiple defibs, Epi + amio  - no STE on post arrest EKG. Noted short PR interval, low voltage.  - initial K 3.0, Mg 2.0. Hs trop 290>>150 - Echo showed EF 30-35%, no LVH. RV mildly reduced  - R/LHC (3/25): nonobs CAD - s/p ICD placement  - Continue amio per EP, hopefully can wean this as he gets further out from arrest. - BMET and Mag today   2. 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 RV mildly reduced. No prior study for comparison  - uncertain if baseline CM that triggered VT/VF vs cardiac stunning post arrest  - R/LHC (3/25): nonobs CAD, suboptimal RV fx w/ low output (RA 12, PAP 38/20, PCW 14 CI 1.9) - cMRI (4/25): LVEF 40%, no RWMA, GHK, RVEF 45%, no significant regurgitation seen in all valves.  - Now s/p ICD placement - NYHA II, limited by ankle fractures. Volume OK today.  - Decrease BiDil  to 0.5 tab tid to allow more BP  room for GDMT. - Add losartan  12.5 mg daily. - Continue Toprol  XL 25 mg daily. - No SGLT2i yet with limited mobility. - Will need genetic testing we discussed this today and he is agreeable. Will arrange. - Labs today, repeat BMET in 10-14 days. - Repeat echo in 2-3 months  3. Physical Deconditioning - Completed CIR - HH PT - Consider Cardiac Rehab when finished with PT.  Follow up in 3 weeks with PharmD (add spiro if able, may need to stop BiDil  to push GDMT), 6 weeks with APP (SGLT2i vs Entresto ), and 3 months with Dr. Margurette Shillings + echo.  Vernia Good, FNP-BC 09/16/23

## 2023-09-16 NOTE — Patient Instructions (Addendum)
 Thank you for coming in today  If you had labs drawn today, any labs that are abnormal the clinic will call you No news is good news Genetic testing was done today  Medications: Start Losartan  12.5 mg 1/2 tablet daily Decrease Bidil  to 1/2 tablet 3 times daily  Follow up appointments:  Your physician recommends that you schedule a follow-up appointment in:  3 weeks in clinic  3 months with echocardiogram With Dr. Bruce Caper  Please call our office to schedule the follow-up appointment in June 2025 for July/august 2025 appointment   Do the following things EVERYDAY: Weigh yourself in the morning before breakfast. Write it down and keep it in a log. Take your medicines as prescribed Eat low salt foods--Limit salt (sodium) to 2000 mg per day.  Stay as active as you can everyday Limit all fluids for the day to less than 2 liters   At the [facility], you and your health needs are our priority. As part of our continuing mission to provide you with exceptional heart care, we have created designated Provider Care Teams. These Care Teams include your primary Cardiologist (physician) and Advanced Practice Providers (APPs- Physician Assistants and Nurse Practitioners) who all work together to provide you with the care you need, when you need it.   You may see any of the following providers on your designated Care Team at your next follow up: Dr Jules Oar Dr Peder Bourdon Dr. Mimi Alt, NP Ruddy Corral, [place] [facility] [place], [place] Dennise Fitz, NP Luster Salters, PharmD   Please be sure to bring in all your medications bottles to every appointment.    Thank you for choosing [facility]  If you have any questions or concerns before your next appointment please send us  a message through [place] or call our office at [phone].    TO LEAVE A MESSAGE FOR THE NURSE SELECT OPTION 2, PLEASE  LEAVE A MESSAGE INCLUDING: YOUR NAME DATE OF BIRTH CALL BACK NUMBER REASON FOR CALL**this is important as we prioritize the call backs  YOU WILL RECEIVE A CALL BACK THE SAME DAY AS LONG AS YOU CALL BEFORE 4:00 PM

## 2023-09-18 ENCOUNTER — Other Ambulatory Visit (HOSPITAL_COMMUNITY): Payer: Self-pay

## 2023-09-18 ENCOUNTER — Telehealth: Payer: Self-pay | Admitting: Nutrition

## 2023-09-18 ENCOUNTER — Telehealth (HOSPITAL_COMMUNITY): Payer: Self-pay

## 2023-09-18 NOTE — Telephone Encounter (Signed)
 It is possible, beta blockers have been associated with vivid dreams and nightmares because they are lipophilic and can cross the blood brain barrier. With his reduced EF, I would be hesitant to take him off beta blockers completely as they provide a mortality benefit. Could try switching to either bisoprolol  or carvedilol if APP thinks clinically appropriate. He may tolerate bisoprolol  better as it has only moderate lipophilicity.

## 2023-09-18 NOTE — Telephone Encounter (Signed)
 Patient would like an HgbA1C before meeting with Dr. Aretha Kubas at his next virtual visit on 10/22/23.  Can he make a lab visit 1 week before his next visit for this, and would you like to add anything else? Please notify the CMA of your response, as I can not make lab appointments. Thank you

## 2023-09-18 NOTE — Telephone Encounter (Signed)
 Sister called stating patient having vivid dreams and nightmares. She looked up all his medications and found that it maybe his Toprol  XL as this is a rare side effect. Sister wondering is there an alternative he can take or does he need to keep taking it. Forwarded to provider and pharmacist for input

## 2023-09-19 ENCOUNTER — Encounter: Admitting: Physical Medicine & Rehabilitation

## 2023-09-19 MED ORDER — BISOPROLOL FUMARATE 5 MG PO TABS: 5.0000 mg | ORAL_TABLET | Freq: Every day | ORAL | 11 refills | Status: DC

## 2023-09-19 NOTE — Telephone Encounter (Signed)
 Sister aware and voiced understanding

## 2023-09-26 ENCOUNTER — Ambulatory Visit (HOSPITAL_COMMUNITY)
Admission: RE | Admit: 2023-09-26 | Discharge: 2023-09-26 | Disposition: A | Discharge status: Home / Self Care | Source: Ambulatory Visit | Attending: Cardiology | Admitting: Cardiology

## 2023-09-26 DIAGNOSIS — I5022 Chronic systolic (congestive) heart failure: Secondary | ICD-10-CM | POA: Diagnosis not present

## 2023-09-26 LAB — BASIC METABOLIC PANEL WITH GFR
Anion gap: 10 (ref 5–15)
BUN: 20 mg/dL (ref 6–20)
CO2: 27 mmol/L (ref 22–32)
Calcium: 9.4 mg/dL (ref 8.9–10.3)
Chloride: 101 mmol/L (ref 98–111)
Creatinine, Ser: 1.27 mg/dL — ABNORMAL HIGH (ref 0.61–1.24)
GFR, Estimated: >60 mL/min (ref >60)
Glucose, Bld: 162 mg/dL — ABNORMAL HIGH (ref 70–99)
Potassium: 4.4 mmol/L (ref 3.5–5.1)
Sodium: 138 mmol/L (ref 135–145)

## 2023-10-01 ENCOUNTER — Encounter: Payer: Self-pay | Admitting: Cardiology

## 2023-10-03 ENCOUNTER — Other Ambulatory Visit: Payer: Self-pay | Admitting: Internal Medicine

## 2023-10-03 ENCOUNTER — Other Ambulatory Visit: Payer: Self-pay

## 2023-10-03 DIAGNOSIS — E782 Mixed hyperlipidemia: Secondary | ICD-10-CM

## 2023-10-04 ENCOUNTER — Encounter: Attending: Physical Medicine & Rehabilitation | Admitting: Physical Medicine & Rehabilitation

## 2023-10-04 ENCOUNTER — Encounter: Payer: Self-pay | Admitting: Physical Medicine & Rehabilitation

## 2023-10-04 ENCOUNTER — Other Ambulatory Visit: Payer: Self-pay

## 2023-10-04 VITALS — BP 138/76 | HR 63 | Ht 73.0 in

## 2023-10-04 DIAGNOSIS — G830 Diplegia of upper limbs: Secondary | ICD-10-CM | POA: Diagnosis not present

## 2023-10-04 DIAGNOSIS — G35 Multiple sclerosis: Secondary | ICD-10-CM

## 2023-10-04 NOTE — Progress Notes (Signed)
 Subjective:    Patient ID: Johnathan Ross, male    DOB: 1970-10-11, 53 y.o.   MRN: [id number] Pertaining to patient's cardiac arrest/V-fib/V. tach with anoxic encephalopathy remained stable.  Cranial CT scan negative.  Followed by neurology services as well as cardiology services.  Subcutaneous heparin  for DVT prophylaxis no bleeding episodes.  Acute biventricular systolic heart failure with right ventricular dominant shock status post ICD 08/30/2023 maintained on amiodarone  decreased to 200 mg daily as well as Toprol  adjusted to 12.5 mg daily and the addition of Bidil  09/10/2023 exhibiting no signs of fluid overload.  He was started on low-dose aspirin .  His Entresto  and Aldactone  were on hold due to CKD follow-up per cardiology services. Blood sugars maintained hemoglobin A1c 7.3 insulin  therapy as directed patient did have an insulin  pump and was cleared to resume use of insulin  pump on discharge as his cognition had returned to baseline.  Synthroid  ongoing for hypothyroidism.  He did have a history of multiple sclerosis followed by neurology services continued on Dalfampridine  10 mg twice daily as well as Requip  for restless legs.  AKI on CKD stage III chemistries stable with latest creatinine 1.60.  53 y.o. right-handed male with history significant for hyperlipidemia, hypertension, hypothyroidism diabetes mellitus with proliferative diabetic retinopathy and has an insulin  pump, multiple sclerosis followed by neurology services Dr. Godwin Lat maintained on dalfampridine .  Per chart review lives with girlfriend.  1 level home 4 steps to entry.  Girlfriend works outside the home.  Modified independent mobility.  Presented 08/26/2023 after collapsing in the community and receiving CPR before he was found by EMS.  Patient was found to have ventricular fibrillation.  He went through 20 minutes of ACLS protocol was intubated and brought to the emergency room found to be in sinus rhythm with prolonged QTc.  CT of the  head and CTA of the chest did not reveal any acute abnormalities although there was some concern of aspiration in the lungs.  He remained intubated 08/26/2023 - 08/27/2023.  Echocardiogram showed ejection fraction of 30 to 35%.  The left ventricle demonstrating global hypokinesis.  EEG suggestive of moderate to severe diffuse encephalopathy no seizure.  Admission chemistries unremarkable except glucose 150 troponin 290-150, urine drug screen positive benzos.  Postarrest EKG normal sinus rhythm with low voltage and short P-RR interval.  No STE.  Cardiac MRI done showed no evidence of LGE but did show some mild to moderate decrease in left ventricular systolic function of 40% and a mild decrease in right ventricular systolic function.  Underwent cardiac catheterization showing mild nonobstructive CAD severely elevated precapillary filling pressures.  Hemodynamically consistent with suboptimal RV function.  He was started on milrinone .  Hospital course followed closely by cardiology services underwent ICD/PPM 08/30/2023.  Elevated leukocytosis 27,100 as well as suggestion of possible aspiration placed on Rocephin  per CCM.  It was felt to possibly elevated leukocytosis with due to steroid demargination received during pacer placement.  He was cleared to begin subcutaneous heparin  for DVT prophylaxis.  AKI on CKD stage III latest creatinine 1.21.  Abnormal LFTs in the setting of shock and cardiac arrest and monitored.  He was tolerating a regular diet.  Therapy evaluations completed due to patient decreased functional mobility was admitted for a comprehensive rehab program.  Admit date: 09/03/2023 Discharge date: 09/11/2023 HPI 53 year old male with history of multiple sclerosis causing spastic paraparesis.  He had cardiac arrest out of hospital on 08/30/2023 and was admitted to [facility].  He was  admitted to the rehab unit with states as above.  Premorbid functional status was modified independent for dressing and  bathing Mainly using walker around the house and wheelchair or transport chair in the community He is back to his premorbid functional status he is here with his mother who drove him today. He continues have some lower extremity spasticity he has been on baclofen  for this.  He has tried Botox which was injected into his calf muscles. He states that the spasticity involves his knees flexing. This keeps him up at night.  Pain Inventory Average Pain 0 Pain Right Now 0 My pain is No pain  LOCATION OF PAIN  NO PAIN  BOWEL Number of stools per week: 2-3 weekly Oral laxative use Yes  Type of laxative Senokot  BLADDER Normal   Mobility walk with assistance use a cane use a walker ability to climb steps?  yes do you drive?  no use a wheelchair transfers alone Do you have any goals in this area?  yes  Function disabled: date disabled about 10 years due to MS Do you have any goals in this area?  yes  Neuro/Psych weakness trouble walking  Prior Studies Any changes since last visit?  yes  Physicians involved in your care Any changes since last visit?  no   Family History  Problem Relation Age of Onset   Diabetes Sister    Colon polyps Maternal Uncle    Colon cancer Maternal Grandmother    Stomach cancer Cousin    Esophageal cancer Neg Hx    Social History   Socioeconomic History   Marital status: Single    Spouse name: 12   Number of children: 1   Years of education: Not on file   Highest education level: Not on file  Occupational History   Occupation: unemployed  Tobacco Use   Smoking status: Never    Passive exposure: Never   Smokeless tobacco: Never  Vaping Use   Vaping status: Never Used  Substance and Sexual Activity   Alcohol use: No   Drug use: No   Sexual activity: Not on file  Other Topics Concern   Not on file  Social History Narrative   Right handed   No caffeine    Lives with girlfriend   Social Drivers of Health   Financial Resource  Strain: Low Risk  (09/11/2023)   Overall Financial Resource Strain (CARDIA)    Difficulty of Paying Living Expenses: Not hard at all  Food Insecurity: No Food Insecurity (09/11/2023)   Hunger Vital Sign    Worried About Running Out of Food in the Last Year: Never true    Ran Out of Food in the Last Year: Never true  Transportation Needs: No Transportation Needs (09/11/2023)   PRAPARE - Administrator, Civil Service (Medical): No    Lack of Transportation (Non-Medical): No  Physical Activity: Sufficiently Active (09/11/2023)   Exercise Vital Sign    Days of Exercise per Week: 5 days    Minutes of Exercise per Session: 30 min  Stress: No Stress Concern Present (09/11/2023)   [employer] of Occupational Health - Occupational Stress Questionnaire    Feeling of Stress : Not at all  Social Connections: Socially Isolated (09/11/2023)   Social Connection and Isolation Panel [NHANES]    Frequency of Communication with Friends and Family: More than three times a week    Frequency of Social Gatherings with Friends and Family: More than three times a week  Attends Religious Services: Never    Active Member of Clubs or Organizations: No    Attends Banker Meetings: Never    Marital Status: Never married   Past Surgical History:  Procedure Laterality Date   EYE SURGERY Bilateral    "laser for diabetic retinopathy"   FRACTURE SURGERY     ICD IMPLANT N/A 08/30/2023   Procedure: ICD IMPLANT;  Surgeon: Lei Pump, MD;  Location: [facility];  Service: Cardiovascular;  Laterality: N/A;   IR VENO/EXT/UNI LEFT  03/24/2020   OPEN REDUCTION INTERNAL FIXATION (ORIF) TIBIA/FIBULA FRACTURE Right 10/30/2016   ORIF ANKLE FRACTURE Left 2015   ORIF TIBIA FRACTURE Right 10/30/2016   Procedure: OPEN REDUCTION INTERNAL FIXATION (ORIF) TIBIA FIBULA FRACTURE;  Surgeon: Hardy Lia, MD;  Location: [facility] OR;  Service: Orthopedics;  Laterality: Right;   RETINAL LASER PROCEDURE  Bilateral    "for diabetic retinopathy"   RIGHT/LEFT HEART CATH AND CORONARY ANGIOGRAPHY N/A 08/26/2023   Procedure: RIGHT/LEFT HEART CATH AND CORONARY ANGIOGRAPHY;  Surgeon: Alwin Baars, DO;  Location: [facility];  Service: Cardiovascular;  Laterality: N/A;   Past Medical History:  Diagnosis Date   Hyperlipidemia    Hypertension    Hypogonadism in male 11/01/2016   Hypothyroidism    Multiple sclerosis (HCC)    Neuromuscular disorder (HCC)    Proliferative diabetic retinopathy(362.02)    Type 1 diabetes mellitus (HCC) dx'd 1994   Vitamin D  insufficiency 11/29/2016   BP (!) 160/91   Pulse 63   Ht 6\' 1"  (1.854 m)   SpO2 98%   BMI 28.13 kg/m   Opioid Risk Score:   Fall Risk Score:  `1  Depression screen [facility] 2/9     10/04/2023    2:43 PM 09/11/2023    9:24 AM 09/10/2022    9:26 AM 08/02/2022    8:08 AM 08/01/2021    8:43 AM 09/21/2020    8:53 AM 02/11/2019    9:54 AM  Depression screen PHQ 2/9  Decreased Interest 0 0 0 0 0 0 0  Down, Depressed, Hopeless 0 0 0 0 0 0 0  PHQ - 2 Score 0 0 0 0 0 0 0  Altered sleeping 0 0       Tired, decreased energy 0 0       Change in appetite 0 0       Feeling bad or failure about yourself  0 0       Trouble concentrating 0 0       Moving slowly or fidgety/restless 0 0       Suicidal thoughts 0 0       PHQ-9 Score 0 0       Difficult doing work/chores  Not difficult at all         Review of Systems     Objective:   Physical Exam General No acute distress Mood and affect appropriate Lower extremity strength is 3 - at bilateral knee extensors to minus hip flexors 2 - at the ankle Tone no evidence of clonus at the ankles.  Knee DTRs are 1+ There is some flexor withdrawal with stroking of the foot with hamstring flexion.  There is no passive resistance with knee extension in the hamstring muscles. Did not bring his walker today ambulation not tested Mood affect appropriate Speech without dysarthria or aphasia Visual fields  intact Extraocular muscles intact Cognition is normal      Assessment & Plan:   1.  Multiple sclerosis with paraparesis and spasticity.  His spasticity is mainly flexor withdrawal spasms.  We discussed treatment options including medication management as well as Botox.  He had Botox injections to his gastroc muscles and states that it hindered his walking.  We discussed that this spasticity is actually more in the hamstring muscle and it does interfere with his sleep.  Injection would involve the hamstring rather than the calf muscles.  He nevertheless would rather not try this again given his prior experience. Will continue outpatient PT Physical medicine rehab follow-up on as-needed basis Follow-up with neurology Dr. Janetta Medina Follow-up with PCP Dr. Rochelle Chu Follow-up with [facility] for ICD check

## 2023-10-07 ENCOUNTER — Other Ambulatory Visit: Payer: Self-pay

## 2023-10-07 ENCOUNTER — Telehealth (HOSPITAL_COMMUNITY): Payer: Self-pay

## 2023-10-07 NOTE — Telephone Encounter (Signed)
 ERROR

## 2023-10-07 NOTE — Progress Notes (Signed)
 ADVANCED HF CLINIC NOTE  Primary Care: Arcadio Knuckles, MD HF Cardiologist: Alwin Baars, DO  HPI: Johnathan Ross is a 53 y.o. male with a hx of DM1, hypothyroidism, HTN, multiple sclerosis, hyperlipidemia and a new diagnosis of systolic heart failure.   Admitted 07/2023 for VF arrest. Shocked 8 times, given epinephrine x 2 and amiodarone . Intubated. HsTroponin 290>>150. Post arrest ECG showed NSR, low voltage, no STE.  UDS + Benzos. Echo showed EF 30-35%, no LVH. RV mildly reduced. Underwent R/LHC showing mild nonobstructive CAD, severely elevated pre capillary filling pressures. Started on milrinone  gtts as hemodynamics consistent with RV failure. cMRI showed LVEF 40%, no RWMA, GHK, RVEF 45%, no significant regurgitation seen in all valves. No pericardial effusion. EEG showed moderate to severe diffuse encephalopathy. No seizures or epileptiform discharges. Underwent ICD implantation. Drips weaned and GDMT titrated, he was discharged to CIR for therapy, weight 222 lbs. CIR course complicated by AKI and hypotension.  Today he returns for HF follow up with his mother and sister (via phone). Overall feeling fine. No SOB working with PT 2x/week. Chronic ankle swelling after breaking both ankles years ago. Has mild pain over ICD pocket when doing upper arm exercises. Denies increasing SOB, palpitations, abnormal bleeding, CP, dizziness, or PND/Orthopnea. Appetite ok. Weight at home 201 pounds. Taking all medications. Mild OSA, no tobacco, ETOH or drug use.  Family Hx: Father with MI & CAD   Cardiac Studies - cMRI (4/25): LVEF 40%, no RWMA, GHK, RVEF 45%, no significant regurgitation seen in all valves.  - R/LHC (3/25, on milrinone  0.25): RA 12, PA 38/20 (26), PCWP 14, CO/CI (Fick) 4.3/1.94, PVR 2.8 WU, PAPi 1.5 Mild, non-obstructive CAD.  - Echo (3/25): EF 30-35%, RV mildly reduced   Past Medical History:  Diagnosis Date   Hyperlipidemia    Hypertension    Hypogonadism in male  11/01/2016   Hypothyroidism    Multiple sclerosis (HCC)    Neuromuscular disorder (HCC)    Proliferative diabetic retinopathy(362.02)    Type 1 diabetes mellitus (HCC) dx'd 1994   Vitamin D  insufficiency 11/29/2016    Current Outpatient Medications  Medication Sig Dispense Refill   acetaminophen  (TYLENOL ) 325 MG tablet Take 1-2 tablets (325-650 mg total) by mouth every 4 (four) hours as needed for mild pain (pain score 1-3).     amiodarone  (PACERONE ) 200 MG tablet Take 1 tablet (200 mg total) by mouth daily. 30 tablet 0   aspirin  EC 81 MG tablet Take 1 tablet (81 mg total) by mouth daily. Swallow whole.     bisoprolol  (ZEBETA ) 5 MG tablet Take 1 tablet (5 mg total) by mouth daily. 30 tablet 11   dalfampridine  10 MG TB12 Take 1 tablet (10 mg total) by mouth 2 (two) times daily. 180 tablet 3   Insulin  Human (INSULIN  PUMP) SOLN Inject 1 each into the skin 3 times daily with meals, bedtime and 2 AM.     isosorbide -hydrALAZINE  (BIDIL ) 20-37.5 MG tablet Take 0.5 tablets by mouth 3 (three) times daily. 45 tablet 6   levocetirizine (XYZAL ) 5 MG tablet TAKE 1 TABLET BY MOUTH EVERY DAY IN THE EVENING 90 tablet 1   levothyroxine  (SYNTHROID ) 125 MCG tablet Take 1 tablet (125 mcg total) by mouth daily at 6 (six) AM. 90 tablet 0   losartan  (COZAAR ) 25 MG tablet Take 0.5 tablets (12.5 mg total) by mouth daily. 45 tablet 3   polyethylene glycol (MIRALAX  / GLYCOLAX ) 17 g packet Take 17 g by mouth daily.  rOPINIRole  (REQUIP ) 0.25 MG tablet Take 1 tablet (0.25 mg total) by mouth at bedtime. 90 tablet 0   senna-docusate (SENOKOT-S) 8.6-50 MG tablet Take 2 tablets by mouth 2 (two) times daily.     simvastatin  (ZOCOR ) 20 MG tablet Take 1 tablet (20 mg total) by mouth daily at 6 PM. 90 tablet 0   vitamin D3 (CHOLECALCIFEROL ) 25 MCG tablet Take 1 tablet (1,000 Units total) by mouth daily. 30 tablet 0   No current facility-administered medications for this encounter.    Allergies  Allergen Reactions    Zestril  [Lisinopril ] Cough   Ivp Dye [Iodinated Contrast Media] Hives and Itching      Social History   Socioeconomic History   Marital status: Single    Spouse name: 12   Number of children: 1   Years of education: Not on file   Highest education level: Not on file  Occupational History   Occupation: unemployed  Tobacco Use   Smoking status: Never    Passive exposure: Never   Smokeless tobacco: Never  Vaping Use   Vaping status: Never Used  Substance and Sexual Activity   Alcohol use: No   Drug use: No   Sexual activity: Not on file  Other Topics Concern   Not on file  Social History Narrative   Right handed   No caffeine    Lives with girlfriend   Social Drivers of Corporate investment banker Strain: Low Risk  (09/11/2023)   Overall Financial Resource Strain (CARDIA)    Difficulty of Paying Living Expenses: Not hard at all  Food Insecurity: No Food Insecurity (09/11/2023)   Hunger Vital Sign    Worried About Running Out of Food in the Last Year: Never true    Ran Out of Food in the Last Year: Never true  Transportation Needs: No Transportation Needs (09/11/2023)   PRAPARE - Administrator, Civil Service (Medical): No    Lack of Transportation (Non-Medical): No  Physical Activity: Sufficiently Active (09/11/2023)   Exercise Vital Sign    Days of Exercise per Week: 5 days    Minutes of Exercise per Session: 30 min  Stress: No Stress Concern Present (09/11/2023)   [employer] of Occupational Health - Occupational Stress Questionnaire    Feeling of Stress : Not at all  Social Connections: Socially Isolated (09/11/2023)   Social Connection and Isolation Panel [NHANES]    Frequency of Communication with Friends and Family: More than three times a week    Frequency of Social Gatherings with Friends and Family: More than three times a week    Attends Religious Services: Never    Database administrator or Organizations: No    Attends Banker  Meetings: Never    Marital Status: Never married  Intimate Partner Violence: Not At Risk (09/11/2023)   Humiliation, Afraid, Rape, and Kick questionnaire    Fear of Current or Ex-Partner: No    Emotionally Abused: No    Physically Abused: No    Sexually Abused: No   Family History  Problem Relation Age of Onset   Diabetes Sister    Colon polyps Maternal Uncle    Colon cancer Maternal Grandmother    Stomach cancer Cousin    Esophageal cancer Neg Hx    Wt Readings from Last 3 Encounters:  10/08/23 96.3 kg (212 lb 3.2 oz)  09/16/23 96.7 kg (213 lb 3.2 oz)  09/11/23 94.4 kg (208 lb 1.8 oz)   BP  134/82   Pulse 67   Wt 96.3 kg (212 lb 3.2 oz)   SpO2 100%   BMI 28.00 kg/m   PHYSICAL EXAM: General:  NAD. No resp difficulty, arrived in [facility] HEENT: Normal Neck: Supple. No JVD. Cor: Regular rate & rhythm. No rubs, gallops or murmurs. ICD pocked looks ok Lungs: Clear Abdomen: Soft, nontender, nondistended.  Extremities: No cyanosis, clubbing, rash, trace ankle edema Neuro: Alert & oriented x 3, moves all 4 extremities w/o difficulty. Affect pleasant.  Device interrogation (personally reviewed): no CorVue data yet, no VT, <1% VP  ASSESSMENT & PLAN: 1. OOH VT/VF Arrest - ~ 20 min bystander CPR. ROSC after multiple defibs, Epi + amio  - no STE on post arrest EKG. Noted short PR interval, low voltage.  - initial K 3.0, Mg 2.0. Hs trop 290>>150 - Echo showed EF 30-35%, no LVH. RV mildly reduced  - R/LHC (3/25): nonobs CAD - s/p ICD placement  - Continue amio per EP, hopefully can wean this as he gets further out from arrest. - Again, reminded of driving restrictions today -  has EP follow up next month. I asked him to confirm MRI compatibility of device with EP  2. 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 RV mildly reduced. No prior study for comparison  - uncertain if baseline CM that triggered VT/VF vs  cardiac stunning post arrest  - R/LHC (3/25): nonobs CAD, suboptimal RV fx w/ low output (RA 12, PAP 38/20, PCW 14 CI 1.9) - cMRI (4/25): LVEF 40%, no RWMA, GHK, RVEF 45%, no significant regurgitation seen in all valves.  - Now s/p ICD placement - NYHA II, limited deconditioning/MS. Volume OK today. - Genetic testing has been sent. - Start spironolactone  12.5 mg daily. - Continue losartan  12.5 mg daily. - Continue bisoprolol  5 mg daily. - Continue BiDil  0.5 tab tid  - No SGLT2i with limited mobility and Type 1 DM, on insulin  pump. - Repeat echo next visit - Labs today, repeat BMET in 1 week  3. Physical Deconditioning - Completed CIR - Continue HH PT - Consider Cardiac Rehab when finished with PT.  Follow up in 2 months with Dr. Margurette Shillings + echo.  Vernia Good, FNP-BC 10/08/23

## 2023-10-07 NOTE — Telephone Encounter (Signed)
 Called to confirm/remind patient of their appointment at the [facility] on 10/08/2023 1200pm.   Appointment:     [x] Left mess    Patient reminded to bring all medications and/or complete list.  Confirmed patient has transportation. Gave directions, instructed to utilize valet parking.

## 2023-10-08 ENCOUNTER — Ambulatory Visit (HOSPITAL_COMMUNITY)
Admission: RE | Admit: 2023-10-08 | Discharge: 2023-10-08 | Disposition: A | Discharge status: Home / Self Care | Source: Ambulatory Visit | Attending: Family Medicine | Admitting: Family Medicine

## 2023-10-08 ENCOUNTER — Ambulatory Visit: Attending: Neurology | Admitting: Physical Therapy

## 2023-10-08 ENCOUNTER — Other Ambulatory Visit: Payer: Self-pay

## 2023-10-08 ENCOUNTER — Ambulatory Visit (HOSPITAL_COMMUNITY): Payer: Self-pay | Admitting: Family Medicine

## 2023-10-08 ENCOUNTER — Encounter: Payer: Self-pay | Admitting: Physical Therapy

## 2023-10-08 ENCOUNTER — Encounter (HOSPITAL_COMMUNITY): Payer: Self-pay

## 2023-10-08 VITALS — BP 134/82 | HR 67 | Wt 212.2 lb

## 2023-10-08 VITALS — BP 125/61 | HR 67

## 2023-10-08 DIAGNOSIS — Z95810 Presence of automatic (implantable) cardiac defibrillator: Secondary | ICD-10-CM | POA: Diagnosis not present

## 2023-10-08 DIAGNOSIS — M6281 Muscle weakness (generalized): Secondary | ICD-10-CM | POA: Insufficient documentation

## 2023-10-08 DIAGNOSIS — I5022 Chronic systolic (congestive) heart failure: Secondary | ICD-10-CM | POA: Diagnosis not present

## 2023-10-08 DIAGNOSIS — I2510 Atherosclerotic heart disease of native coronary artery without angina pectoris: Secondary | ICD-10-CM | POA: Diagnosis not present

## 2023-10-08 DIAGNOSIS — I110 Hypertensive heart disease with heart failure: Secondary | ICD-10-CM | POA: Diagnosis not present

## 2023-10-08 DIAGNOSIS — R29818 Other symptoms and signs involving the nervous system: Secondary | ICD-10-CM | POA: Diagnosis not present

## 2023-10-08 DIAGNOSIS — I469 Cardiac arrest, cause unspecified: Secondary | ICD-10-CM | POA: Diagnosis not present

## 2023-10-08 DIAGNOSIS — R29898 Other symptoms and signs involving the musculoskeletal system: Secondary | ICD-10-CM | POA: Diagnosis not present

## 2023-10-08 DIAGNOSIS — I4901 Ventricular fibrillation: Secondary | ICD-10-CM | POA: Insufficient documentation

## 2023-10-08 DIAGNOSIS — I5082 Biventricular heart failure: Secondary | ICD-10-CM | POA: Insufficient documentation

## 2023-10-08 DIAGNOSIS — R262 Difficulty in walking, not elsewhere classified: Secondary | ICD-10-CM | POA: Insufficient documentation

## 2023-10-08 DIAGNOSIS — M25511 Pain in right shoulder: Secondary | ICD-10-CM | POA: Insufficient documentation

## 2023-10-08 DIAGNOSIS — Z8249 Family history of ischemic heart disease and other diseases of the circulatory system: Secondary | ICD-10-CM | POA: Insufficient documentation

## 2023-10-08 DIAGNOSIS — G4733 Obstructive sleep apnea (adult) (pediatric): Secondary | ICD-10-CM | POA: Insufficient documentation

## 2023-10-08 DIAGNOSIS — R5381 Other malaise: Secondary | ICD-10-CM | POA: Diagnosis not present

## 2023-10-08 DIAGNOSIS — E109 Type 1 diabetes mellitus without complications: Secondary | ICD-10-CM | POA: Insufficient documentation

## 2023-10-08 DIAGNOSIS — Z79899 Other long term (current) drug therapy: Secondary | ICD-10-CM | POA: Diagnosis not present

## 2023-10-08 DIAGNOSIS — E782 Mixed hyperlipidemia: Secondary | ICD-10-CM

## 2023-10-08 DIAGNOSIS — I472 Ventricular tachycardia, unspecified: Secondary | ICD-10-CM | POA: Diagnosis not present

## 2023-10-08 DIAGNOSIS — M21371 Foot drop, right foot: Secondary | ICD-10-CM | POA: Insufficient documentation

## 2023-10-08 DIAGNOSIS — M21372 Foot drop, left foot: Secondary | ICD-10-CM | POA: Diagnosis not present

## 2023-10-08 DIAGNOSIS — R2681 Unsteadiness on feet: Secondary | ICD-10-CM | POA: Insufficient documentation

## 2023-10-08 DIAGNOSIS — Z8674 Personal history of sudden cardiac arrest: Secondary | ICD-10-CM | POA: Insufficient documentation

## 2023-10-08 DIAGNOSIS — R2689 Other abnormalities of gait and mobility: Secondary | ICD-10-CM | POA: Insufficient documentation

## 2023-10-08 DIAGNOSIS — Z794 Long term (current) use of insulin: Secondary | ICD-10-CM | POA: Diagnosis not present

## 2023-10-08 DIAGNOSIS — G8929 Other chronic pain: Secondary | ICD-10-CM | POA: Insufficient documentation

## 2023-10-08 LAB — BRAIN NATRIURETIC PEPTIDE: B Natriuretic Peptide: 113 pg/mL — ABNORMAL HIGH (ref 0.0–100.0)

## 2023-10-08 LAB — BASIC METABOLIC PANEL WITH GFR
Anion gap: 10 (ref 5–15)
BUN: 15 mg/dL (ref 6–20)
CO2: 25 mmol/L (ref 22–32)
Calcium: 9.5 mg/dL (ref 8.9–10.3)
Chloride: 102 mmol/L (ref 98–111)
Creatinine, Ser: 1.27 mg/dL — ABNORMAL HIGH (ref 0.61–1.24)
GFR, Estimated: >60 mL/min (ref >60)
Glucose, Bld: 109 mg/dL — ABNORMAL HIGH (ref 70–99)
Potassium: 4.9 mmol/L (ref 3.5–5.1)
Sodium: 137 mmol/L (ref 135–145)

## 2023-10-08 MED ORDER — AMIODARONE HCL 200 MG PO TABS: 200.0000 mg | ORAL_TABLET | Freq: Every day | ORAL | 0 refills | Status: DC

## 2023-10-08 MED ORDER — SIMVASTATIN 20 MG PO TABS: 20.0000 mg | ORAL_TABLET | Freq: Every day | ORAL | 0 refills | Status: DC

## 2023-10-08 MED ORDER — SPIRONOLACTONE 25 MG PO TABS: 12.5000 mg | ORAL_TABLET | Freq: Every day | ORAL | 3 refills | Status: DC

## 2023-10-08 MED ORDER — LOSARTAN POTASSIUM 25 MG PO TABS
12.5000 mg | ORAL_TABLET | Freq: Every day | ORAL | 3 refills | Status: DC
Start: 2023-10-08 — End: 2023-12-03

## 2023-10-08 MED ORDER — BISOPROLOL FUMARATE 5 MG PO TABS: 5.0000 mg | ORAL_TABLET | Freq: Every day | ORAL | 6 refills | Status: DC

## 2023-10-08 MED FILL — Simvastatin Tab 20 MG: ORAL | 90 days supply | Qty: 90 | Fill #0 | Status: CN

## 2023-10-08 MED FILL — Ropinirole Hydrochloride Tab 0.25 MG: ORAL | 90 days supply | Qty: 90 | Fill #0 | Status: CN

## 2023-10-08 MED FILL — Levothyroxine Sodium Tab 125 MCG: ORAL | 30 days supply | Qty: 30 | Fill #0 | Status: CN

## 2023-10-08 NOTE — Patient Instructions (Addendum)
 Good to see you today!  Start spironolactone  12.5 mg (1/2 tablet) daily  Labs done today, your results will be available in MyChart, we will contact you for abnormal readings.  Repeat lab work  in 1 week  Your physician has requested that you have an echocardiogram. Echocardiography is a painless test that uses sound waves to create images of your heart. It provides your doctor with information about the size and shape of your heart and how well your heart's chambers and valves are working. This procedure takes approximately one hour. There are no restrictions for this procedure. Please do NOT wear cologne, perfume, aftershave, or lotions (deodorant is allowed). Please arrive 15 minutes prior to your appointment time.  Please note: We ask at that you not bring children with you during ultrasound (echo/ vascular) testing. Due to room size and safety concerns, children are not allowed in the ultrasound rooms during exams. Our front office staff cannot provide observation of children in our lobby area while testing is being conducted. An adult accompanying a patient to their appointment will only be allowed in the ultrasound room at the discretion of the ultrasound technician under special circumstances. We apologize for any inconvenience.  Your physician recommends that you schedule a follow-up appointment :2 months with echo as scheduled  If you have any questions or concerns before your next appointment please send us  a message through [place] or call our office at [phone].    TO LEAVE A MESSAGE FOR THE NURSE SELECT OPTION 2, PLEASE LEAVE A MESSAGE INCLUDING: YOUR NAME DATE OF BIRTH CALL BACK NUMBER REASON FOR CALL**this is important as we prioritize the call backs  YOU WILL RECEIVE A CALL BACK THE SAME DAY AS LONG AS YOU CALL BEFORE 4:00 PM At the [facility], you and your health needs are our priority. As part of our continuing mission to provide you with exceptional  heart care, we have created designated Provider Care Teams. These Care Teams include your primary Cardiologist (physician) and Advanced Practice Providers (APPs- Physician Assistants and Nurse Practitioners) who all work together to provide you with the care you need, when you need it.   You may see any of the following providers on your designated Care Team at your next follow up: Dr Jules Oar Dr Peder Bourdon Dr. Alwin Baars Dr. Arta Lark Amy Marijane Shoulders, NP Ruddy Corral, [place] [facility] [place], [place] Dennise Fitz, NP [place] Lee, NP Shawnee Dellen, NP Luster Salters, PharmD Bevely Brush, PharmD   Please be sure to bring in all your medications bottles to every appointment.    Thank you for choosing [facility]

## 2023-10-08 NOTE — Addendum Note (Signed)
 Encounter addended by: Moneka Mcquinn M, RN on: 10/08/2023 1:30 PM  Actions taken: Visit diagnoses modified, Diagnosis association updated, Order list changed

## 2023-10-08 NOTE — Therapy (Unsigned)
 OUTPATIENT PHYSICAL THERAPY NEURO EVALUATION   Patient Name: Johnathan Ross MRN: [id number] DOB:1970/06/28, 53 y.o., male Today's Date: 10/09/2023   PCP: Arcadio Knuckles, MD  REFERRING PROVIDER: Sterling Eisenmenger, PA-C  END OF SESSION:  PT End of Session - 10/08/23 1002     Visit Number 1    Number of Visits 13   12 + eval   Date for PT Re-Evaluation 12/20/23   pushed out due to scheduling delay   Authorization Type [employer]    PT Start Time 0930    PT Stop Time 1024    PT Time Calculation (min) 54 min    Equipment Utilized During Treatment Gait belt    Activity Tolerance Patient tolerated treatment well    Behavior During Therapy [facility] for tasks assessed/performed             Past Medical History:  Diagnosis Date   Hyperlipidemia    Hypertension    Hypogonadism in male 11/01/2016   Hypothyroidism    Multiple sclerosis (HCC)    Neuromuscular disorder (HCC)    Proliferative diabetic retinopathy(362.02)    Type 1 diabetes mellitus (HCC) dx'd 1994   Vitamin D  insufficiency 11/29/2016   Past Surgical History:  Procedure Laterality Date   EYE SURGERY Bilateral    "laser for diabetic retinopathy"   FRACTURE SURGERY     ICD IMPLANT N/A 08/30/2023   Procedure: ICD IMPLANT;  Surgeon: Lei Pump, MD;  Location: [facility] INVASIVE CV LAB;  Service: Cardiovascular;  Laterality: N/A;   IR VENO/EXT/UNI LEFT  03/24/2020   OPEN REDUCTION INTERNAL FIXATION (ORIF) TIBIA/FIBULA FRACTURE Right 10/30/2016   ORIF ANKLE FRACTURE Left 2015   ORIF TIBIA FRACTURE Right 10/30/2016   Procedure: OPEN REDUCTION INTERNAL FIXATION (ORIF) TIBIA FIBULA FRACTURE;  Surgeon: Hardy Lia, MD;  Location: [facility] OR;  Service: Orthopedics;  Laterality: Right;   RETINAL LASER PROCEDURE Bilateral    "for diabetic retinopathy"   RIGHT/LEFT HEART CATH AND CORONARY ANGIOGRAPHY N/A 08/26/2023   Procedure: RIGHT/LEFT HEART CATH AND CORONARY ANGIOGRAPHY;  Surgeon: Alwin Baars, DO;  Location: [facility]  INVASIVE CV LAB;  Service: Cardiovascular;  Laterality: N/A;   Patient Active Problem List   Diagnosis Date Noted   Attention and concentration deficit 09/10/2023   Acute encephalopathy 09/03/2023   Cardiac arrest (HCC) 08/26/2023   Encounter for screening for HIV 06/17/2023   Need for immunization against influenza 06/17/2023   Loud snoring 06/17/2023   Encounter for general adult medical examination with abnormal findings 08/02/2022   Screening for prostate cancer 08/01/2021   OAB (overactive bladder) 09/21/2020   Essential hypertension 09/21/2020   Thoracic outlet syndrome 05/03/2020   Controlled type 1 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 (HCC) 03/24/2020   Right epiretinal membrane 03/24/2020   Retinal exudates and deposits 03/24/2020   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   Nuclear sclerotic cataract of both eyes 03/24/2020   Gait disorder 03/23/2020   Benign prostatic hyperplasia without lower urinary tract symptoms 03/01/2020   Diabetic gastroparesis associated with type 1 diabetes mellitus (HCC) 03/01/2020   Gait disturbance 08/19/2019   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/19/2019   Vitamin D  insufficiency 11/29/2016   Hypogonadism in male 11/01/2016   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HCC)    Hypothyroidism    Hypertension    Dyslipidemia, goal LDL below 130    MULTIPLE SCLEROSIS 08/29/2007   PROLIFERATIVE DIABETIC RETINOPATHY 08/29/2007    ONSET DATE: 08/26/2023  REFERRING DIAG: G93.40 (ICD-10-CM) -  Acute encephalopathy  THERAPY DIAG:  Other abnormalities of gait and mobility  Muscle weakness (generalized)  Other symptoms and signs involving the nervous system  Other symptoms and signs involving the musculoskeletal system  Unsteadiness on feet  Difficulty in walking, not elsewhere classified  Foot drop, right  Foot drop, left  Chronic right shoulder pain  Rationale for Evaluation and Treatment:  Rehabilitation  SUBJECTIVE:                                                                                                                                                                                             SUBJECTIVE STATEMENT: Pt presents to clinic w/RW and bilateral AFOs donned.  He is with sister today and presents following recent hospitalization following cardiac arrest and encephalopathy.  He reports feeling back to his baseline in terms of walking.  He was unaware PT could see his recent hospitalization and reports he did not know that was why he was referred back.  Pt accompanied by: self and Sister  PERTINENT HISTORY: vitamin D  insufficiency, Type 1 IDDM w/ polyneuropathy, diabetic retinopathy, hypothyroidism, HTN, hypercholestrolemia, h/o Rt tibia fracture, h/o Lt ankle fracture with ORIF, MS with acquired diplegia (MS diagnosis 2014/2015)  In the week prior to his cardiac arrest he reported having some near syncopal episodes.  On 3/31 he went into witnessed cardiac arrest w/ bystander performing CPR for 20-25 minutes.  Per EMS notes he had persistent V-fib requiring 8 total defibrillations, 2 doses of epi and some ROSC w/ V tach.  He was also given Narcan in the field due to small pupils.  PAIN:  Are you having pain? Yes: NPRS scale: 3 Pain location: R shoulder  Pain description: Radiates down RUE, anterior joint pain, unable to perform IR well    PRECAUTIONS: Fall and ICD/Pacemaker (Can raise R shoulder as of May 9th - 6wks post placement of ICD; monitor BP - if systolic drops below 100 he is to follow-up with Cardiologist about medication management)  RED FLAGS: None   WEIGHT BEARING RESTRICTIONS: No  FALLS: Has patient fallen in last 6 months? No  LIVING ENVIRONMENT: Lives with: lives with their partner Lives in: House/apartment Stairs: Yes: External: 5 steps; on right going up, on left going up, and can reach both Has following equipment at home: Single point  cane, Walker - 2 wheeled, Wheelchair (manual), and shower chair  PLOF: Requires assistive device for independence and Needs assistance with homemaking  PATIENT GOALS: "fix my brace and if there's anything we can do for my shoulder"   OBJECTIVE:  Note: Objective measures were completed at Evaluation unless otherwise  noted.  DIAGNOSTIC FINDINGS:  MRI Cardiac 08/29/2023: IMPRESSION: 1. Mild to moderate decrease in left ventricular systolic function (LVEF =40%).   2. There is no late gadolinium enhancement in the left ventricular myocardium. Normal parametric mapping and tissue characterization.   3. Mild decrease in right ventricular systolic function (RVEF =45%).  CT Head 08/26/2023: IMPRESSION: 1. No evidence of acute intracranial abnormality. 2. White matter lesions better characterized on prior MRI.  CT Angio Chest PE 08/26/2023: IMPRESSION: 1. No pulmonary embolism. 2. Moderate left anterior descending coronary artery calcification. 3. Extensive dependent pulmonary consolidation throughout the lungs which may relate to aspiration or multifocal infection.  *Updated imaging ordered by Dr. Godwin Lat (MRI of brain and C-spine)   COGNITION: Overall cognitive status: Within functional limits for tasks assessed   SENSATION: Light touch: WFL   MUSCLE TONE: Increased extensor tone of LLE >RLE but ROM and coordination limited bilaterally due to extensor tone/spasticity    POSTURE: rounded shoulders, forward head, and weight shift right  LOWER EXTREMITY ROM:   Limited by extensor tone/spasticity   Active  Right Eval Left Eval  Hip flexion    Hip extension    Hip abduction    Hip adduction    Hip internal rotation    Hip external rotation    Knee flexion    Knee extension    Ankle dorsiflexion    Ankle plantarflexion    Ankle inversion    Ankle eversion     (Blank rows = not tested)  LOWER EXTREMITY MMT:  Tested in seated position   MMT Right Eval Left Eval  Hip  flexion 1 1  Hip extension    Hip abduction 4- 4-  Hip adduction    Hip internal rotation    Hip external rotation    Knee flexion 2 3-  Knee extension 3+ 1  Ankle dorsiflexion AFO donned AFO donned  Ankle plantarflexion    Ankle inversion    Ankle eversion    (Blank rows = not tested)  BED MOBILITY:  Independent per pt - still in a standard bed, needs increased time to roll L and R  TRANSFERS: Assistive device utilized: Environmental consultant - 2 wheeled  Sit to stand: SBA Stand to sit: SBA Heavy reliance on BUEs to essentially "throw" himself out of chair, wide BOS, better hip extension today when standing to 2WW, when he lets go he maintains anterior lean in attempt to prevent knees locking out, when knees lock out he loses his balance.   GAIT: Gait pattern: step to pattern, decreased step length- Left, decreased stride length, decreased hip/knee flexion- Right, decreased hip/knee flexion- Left, decreased ankle dorsiflexion- Right, decreased ankle dorsiflexion- Left, lateral hip instability, trunk flexed, narrow BOS, and poor foot clearance- Left Distance walked: Various clinic distances  Assistive device utilized: Walker - 2 wheeled Level of assistance: SBA Comments: Pt required 9 minutes to ambulate into treatment room (~30') , frequently stopping to rest as his "legs lock up". Noted excessive inversion of L ankle as well as scissoring of LLE. Pt relying heavily on BUEs to perform more of a swing-to pattern.   FUNCTIONAL TESTS:  TREATMENT:   Self-care/Home management  Extensive education throughout including:   L AFO fit/tone impacting seating in brace - will reach out to [employer] to see if a rep can visit clinic during pt slot to assist. Energy conservation and cardiac response to exercise - using modified BORG to assess personal response to work load as  medications will likely blunt response. Ongoing MS impacts to energy and heat impacting functionality/exacerbating symptoms - recommending cooling vest from MS society, sister makes note to help pt obtain one. Focus of current PT episode on slowly increasing cardiac demand to strengthen CV response appropriately > adjusting AFO fit as able > protecting particularly R shoulder mobility and addressing pain vs prior heavy focus on balance and gait deficits. Discussed likely wheelchair need changing to power assist or power option in the future due to cardiac changes and shoulder pain.  He is fine with his manual for now.  PATIENT EDUCATION: Education details: See above, PT POC, assessments used and to be used, and goals to be set.  Person educated: Patient and Sister Education method: Explanation Education comprehension: needs further education  HOME EXERCISE PROGRAM: To be reviewed from previous POC: [id number] and [id number] - Please condense into one HEP!   GOALS: Goals reviewed with patient? Yes  SHORT TERM GOALS: Target date: 11/08/2023  PT will review, condense and modify HEP to meet current functional level. Baseline: to be reviewed from previous POC Goal status: INITIAL   Patient will obtain cooling vest from MS society to improve ability to continue activity in warmer months. Baseline: PT will fax request to [employer] if pt unable to obtain on his own. Goal status: INITIAL   [employer] rep and PT to further assess and adjust left AFO if able to improve seating into brace for improved utilization. Baseline: PT to contact [employer]. Goal status: INITIAL  LONG TERM GOALS: Target date: 12/06/2023   Patient will tolerate low to moderate level cardio focused exercise for >/=10 minutes w/ RPE of no more than 5/10 in order to demonstrate tolerance to steady state activity. Baseline: To be introduced. Goal status: INITIAL  Patient will verbalize strategies for energy conservation to improve  compliance to safety recommendations. Baseline: To be reviewed. Goal status: INITIAL  Patient will report no right shoulder pain with light activity in order to improve functional use of UE. Baseline: 1-2/10 w/ repetition Goal status: INITIAL  4.  Patient will demonstrate unsupported standing x30 seconds without LOB in order to improve upright safety and functional participation in ADLs. Baseline:  BLE lockout w/ LOB  Goal status:  INITIAL  5.  Patient will demonstrate reach to floor from standing at no more than supervision level to improve functional upright mobility and independence. Baseline:  Cannot reach to floor without BLE collapsing  Goal status:  INITIAL  ASSESSMENT:  CLINICAL IMPRESSION: Patient is a 53 year old male referred to Neuro OPPT for recent hospitalization from encephalopathy secondary to cardiac arrest.   Pt's PMH is significant for: vitamin D  insufficiency, Type 1 IDDM w/ polyneuropathy, diabetic retinopathy, hypothyroidism, HTN, hypercholestrolemia, h/o Rt tibia fracture, h/o Lt ankle fracture with ORIF, MS with acquired diplegia (MS diagnosis 2014/2015). The following deficits were present during the exam: impaired safety awareness, impaired functional strength, extensor tone/spasticity, decreased knowledge of condition, improper body mechanics and impaired sensation. Functional testing held today as fall risk is known for this patient due to extensor tone, functional BLE weakness, and sensory changes now exacerbated by recent cardiac status change. Pt  would benefit from skilled PT to address these impairments and functional limitations to maximize functional mobility independence.    OBJECTIVE IMPAIRMENTS: Abnormal gait, cardiopulmonary status limiting activity, decreased activity tolerance, decreased balance, decreased cognition, decreased coordination, decreased endurance, decreased knowledge of condition, decreased knowledge of use of DME, decreased mobility,  difficulty walking, decreased ROM, decreased strength, decreased safety awareness, impaired perceived functional ability, increased muscle spasms, impaired sensation, impaired tone, improper body mechanics, and pain  ACTIVITY LIMITATIONS: carrying, lifting, bending, standing, squatting, stairs, transfers, reach over head, locomotion level, and caring for others  PARTICIPATION LIMITATIONS: cleaning, driving, shopping, community activity, occupation, and yard work  PERSONAL FACTORS: Behavior pattern, Education, Fitness, Past/current experiences, and 1-2 comorbidities: MS and significant cardiac involvement are also affecting patient's functional outcome.   REHAB POTENTIAL: Fair see personal factors and PMH  CLINICAL DECISION MAKING: Unstable/unpredictable  EVALUATION COMPLEXITY: High  PLAN:  PT FREQUENCY: 1-2x/week (2x/wk for initial 4 wks and 1x/wk for last 4 wks)  PT DURATION: 8 weeks  PLANNED INTERVENTIONS: 97164- PT Re-evaluation, 97750- Physical Performance Testing, 97110-Therapeutic exercises, 97530- Therapeutic activity, [id number]- Neuromuscular re-education, 97535- Self Care, 91478- Manual therapy, [id number]- Gait training, [phone]- Orthotic Initial, [id number]- Orthotic/Prosthetic subsequent, [id number]- Aquatic Therapy, [phone]- Electrical stimulation (manual), Patient/Family education, Balance training, Stair training, Taping, Dry Needling, Joint mobilization, DME instructions, Wheelchair mobility training, Cryotherapy, and Moist heat  PLAN FOR NEXT SESSION: Review HEP - condense/modify.  Reaching to floor level.  Standing balance, AFO fit - can someone from [employer] come assess left AFO?  Cardiac focus - modified BORG response and circuit style training.  Rotator cuff rehab - possible TPDN to right shoulder/upper traps.  Earlean Glaze, PT, DPT 10/09/2023, 1:13 PM

## 2023-10-09 MED ORDER — FUROSEMIDE 20 MG PO TABS: 20.0000 mg | ORAL_TABLET | ORAL | 1 refills | Status: DC | PRN

## 2023-10-09 NOTE — Telephone Encounter (Addendum)
 Pt aware, agreeable, and verbalized understanding  Med list up dated   ----- Message from Elmarie Hacking sent at 10/08/2023  3:23 PM EDT ----- K is on the higher end of normal, do not start spiro as discussed at visit (this will make K too high).  Please give Rx for Lasix  20 mg PRN for weight gain or swelling.

## 2023-10-13 NOTE — Progress Notes (Addendum)
 Cardiology Office Note:  .   Date:  10/13/2023  ID:  Johnathan Ross, DOB September 09, 1970, MRN [id number] PCP: Arcadio Knuckles, MD  [facility] Providers Cardiologist:  Alwin Baars, DO { EP: Dr. Lawana Pray  History of Present Illness: .   Johnathan Ross is a 53 y.o. male w/PMHx of  HTN, HLD, DM, hypothyroidism, Multiple Sclerosis  March 2025, VF arrest Shocked 8 times, given epinephrine x 2 and amiodarone   Echo showed EF 30-35%, no LVH. RV mildly reduced.  R/LHC showing mild nonobstructive CAD, severely elevated pre capillary filling pressures.  Started on milrinone  gtts as hemodynamics consistent with RV failure. cMRI showed LVEF 40%, no RWMA, GHK, RVEF 45%, no significant regurgitation seen in all valves Underwent ICD implantation. Drips weaned and GDMT titrated, he was discharged to [facility]   The patient was in CIR when his wound check was due. He was seen, site stable In d/w staff though they had been doing b/l UE resistance exercises/therapy. Device measurements were OK, CXR reviewed with Dr. Lawana Pray noting the lead had moved though remained in an acceptable position.  Given numbers were OK, not felt need for revision. Restrictions re-reviewed with patient/staff  Seeing HF team a couple times Last 10/08/23 Some degree of chronic edema Still working with PT Pending EP to decide amiodarone  and confirm MRI compatability Genetic testing sent Started on spiro   Today's visit is scheduled as a 1 mo visit to f/u on lead measurements  ROS:   He comes today accompanied by his girl freind. He has not had any syncope, near syncope, shocks He has ahad a few times a sense of anxiety when getting to/starting up the shower, seems the smell of the water, "triggered something"  though he says this has not happened in a while now     Device information Abbott single chamber ICD implanted 08/30/23  Arrhythmia/AAD hx Amiodarone  started March 2025  Studies Reviewed: Aaron Aas    EKG not  done today  DEVICE interrogation done today and reviewed by myself Battery is good No arrhythmias VP <1% Sensing/impedance are stable Threshold up slightly from 1.25/0.5 > 1.5/0.5 Acute output 3.5V programmed No changes made   Risk Assessment/Calculations:    Physical Exam:   VS:  There were no vitals taken for this visit.   Wt Readings from Last 3 Encounters:  10/08/23 212 lb 3.2 oz (96.3 kg)  09/16/23 213 lb 3.2 oz (96.7 kg)  09/11/23 208 lb 1.8 oz (94.4 kg)    GEN: Well nourished, well developed in no acute distress NECK: No JVD; No carotid bruits CARDIAC: RRR, no murmurs, rubs, gallops RESPIRATORY:  CTA b/l without rales, wheezing or rhonchi  ABDOMEN: Soft, non-tender, non-distended EXTREMITIES: No edema; No deformity   PPM site: is stable, well healed, no thinning, fluctuation, tethering  ASSESSMENT AND PLAN: .    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  No exam findings of volume OL C/w AHF team           Disposition: follow up in July as scheduled, sooner if needed  Signed, Debbie Fails, PA-C

## 2023-10-14 ENCOUNTER — Other Ambulatory Visit: Payer: Self-pay | Admitting: Family Medicine

## 2023-10-14 ENCOUNTER — Other Ambulatory Visit: Payer: Self-pay

## 2023-10-14 MED ORDER — VITAMIN D3 25 MCG PO TABS: 1000.0000 [IU] | ORAL_TABLET | Freq: Every day | ORAL | 0 refills | Status: AC

## 2023-10-14 MED ORDER — ROPINIROLE HCL 0.25 MG PO TABS: 0.2500 mg | ORAL_TABLET | Freq: Every day | ORAL | 0 refills | Status: DC

## 2023-10-14 NOTE — Telephone Encounter (Signed)
 Copied from CRM [id number]. Topic: Clinical - Medication Refill >> Oct 14, 2023 10:33 AM Emmet Harm C wrote: Medication: vitamin D3 (CHOLECALCIFEROL ) 25 MCG tablet   Has the patient contacted their pharmacy? Yes (Agent: If no, request that the patient contact the pharmacy for the refill. If patient does not wish to contact the pharmacy document the reason why and proceed with request.) (Agent: If yes, when and what did the pharmacy advise?)  This is the patient's preferred pharmacy:  [id number] - [place], [place] - [address] AT [facility] [address] [place] [address] Phone: [phone] Fax: [phone]  Is this the correct pharmacy for this prescription? Yes If no, delete pharmacy and type the correct one.   Has the prescription been filled recently? No  Is the patient out of the medication? Yes  Has the patient been seen for an appointment in the last year OR does the patient have an upcoming appointment? Yes  Can we respond through MyChart? No  Agent: Please be advised that Rx refills may take up to 3 business days. We ask that you follow-up with your pharmacy.

## 2023-10-14 NOTE — Telephone Encounter (Signed)
 Copied from CRM [id number]. Topic: Clinical - Medication Refill >> Oct 14, 2023  8:40 AM [name] wrote: Medication: rOPINIRole  (REQUIP ) 0.25 MG tablet  Has the patient contacted their pharmacy? No (Agent: If no, request that the patient contact the pharmacy for the refill. If patient does not wish to contact the pharmacy document the reason why and proceed with request.) (Agent: If yes, when and what did the pharmacy advise?) Out of refills  This is the patient's preferred pharmacy:  [id number] - [place], [place] - [address] AT [facility] [address] [place] [address] Phone: [phone] Fax: [phone]  Is this the correct pharmacy for this prescription? Yes If no, delete pharmacy and type the correct one.   Has the prescription been filled recently? No  Is the patient out of the medication? Yes  Has the patient been seen for an appointment in the last year OR does the patient have an upcoming appointment? Yes  Can we respond through MyChart? Yes  Agent: Please be advised that Rx refills may take up to 3 business days. We ask that you follow-up with your pharmacy.

## 2023-10-15 ENCOUNTER — Ambulatory Visit (INDEPENDENT_AMBULATORY_CARE_PROVIDER_SITE_OTHER): Admitting: Physician Assistant

## 2023-10-15 ENCOUNTER — Ambulatory Visit
Admission: RE | Admit: 2023-10-15 | Discharge: 2023-10-15 | Disposition: A | Discharge status: Home / Self Care | Source: Ambulatory Visit | Attending: Cardiology | Admitting: Cardiology

## 2023-10-15 ENCOUNTER — Encounter: Payer: Self-pay | Admitting: Physician Assistant

## 2023-10-15 VITALS — BP 136/78 | HR 64 | Ht 73.0 in | Wt 198.0 lb

## 2023-10-15 DIAGNOSIS — Z4502 Encounter for adjustment and management of automatic implantable cardiac defibrillator: Secondary | ICD-10-CM | POA: Insufficient documentation

## 2023-10-15 DIAGNOSIS — Z9641 Presence of insulin pump (external) (internal): Secondary | ICD-10-CM | POA: Diagnosis not present

## 2023-10-15 DIAGNOSIS — E039 Hypothyroidism, unspecified: Secondary | ICD-10-CM | POA: Diagnosis not present

## 2023-10-15 DIAGNOSIS — Z95810 Presence of automatic (implantable) cardiac defibrillator: Secondary | ICD-10-CM | POA: Insufficient documentation

## 2023-10-15 DIAGNOSIS — I110 Hypertensive heart disease with heart failure: Secondary | ICD-10-CM | POA: Diagnosis not present

## 2023-10-15 DIAGNOSIS — Z79899 Other long term (current) drug therapy: Secondary | ICD-10-CM

## 2023-10-15 DIAGNOSIS — I5022 Chronic systolic (congestive) heart failure: Secondary | ICD-10-CM

## 2023-10-15 DIAGNOSIS — E109 Type 1 diabetes mellitus without complications: Secondary | ICD-10-CM | POA: Diagnosis not present

## 2023-10-15 DIAGNOSIS — I428 Other cardiomyopathies: Secondary | ICD-10-CM | POA: Insufficient documentation

## 2023-10-15 DIAGNOSIS — G35 Multiple sclerosis: Secondary | ICD-10-CM | POA: Insufficient documentation

## 2023-10-15 DIAGNOSIS — Z794 Long term (current) use of insulin: Secondary | ICD-10-CM | POA: Insufficient documentation

## 2023-10-15 DIAGNOSIS — E785 Hyperlipidemia, unspecified: Secondary | ICD-10-CM | POA: Insufficient documentation

## 2023-10-15 DIAGNOSIS — I2510 Atherosclerotic heart disease of native coronary artery without angina pectoris: Secondary | ICD-10-CM | POA: Insufficient documentation

## 2023-10-15 LAB — CUP PACEART INCLINIC DEVICE CHECK
Battery Remaining Longevity: 121 mo
Brady Statistic RV Percent Paced: 0 %
Date Time Interrogation Session: 20250520095210
HighPow Impedance: 57.375
Implantable Lead Connection Status: 753985
Implantable Lead Implant Date: 20250404
Implantable Lead Location: 753860
Implantable Pulse Generator Implant Date: 20250404
Lead Channel Impedance Value: 375 Ohm
Lead Channel Pacing Threshold Amplitude: 1.5 V
Lead Channel Pacing Threshold Amplitude: 1.5 V
Lead Channel Pacing Threshold Pulse Width: 0.5 ms
Lead Channel Pacing Threshold Pulse Width: 0.5 ms
Lead Channel Sensing Intrinsic Amplitude: 7.3 mV
Lead Channel Setting Pacing Amplitude: 3.5 V
Lead Channel Setting Pacing Pulse Width: 0.5 ms
Lead Channel Setting Sensing Sensitivity: 0.5 mV
Pulse Gen Serial Number: 211022042
Zone Setting Status: 755011

## 2023-10-15 LAB — BASIC METABOLIC PANEL WITH GFR
Anion gap: 7 (ref 5–15)
BUN: 17 mg/dL (ref 6–20)
CO2: 27 mmol/L (ref 22–32)
Calcium: 9.2 mg/dL (ref 8.9–10.3)
Chloride: 104 mmol/L (ref 98–111)
Creatinine, Ser: 1.16 mg/dL (ref 0.61–1.24)
GFR, Estimated: >60 mL/min (ref >60)
Glucose, Bld: 238 mg/dL — ABNORMAL HIGH (ref 70–99)
Potassium: 4.7 mmol/L (ref 3.5–5.1)
Sodium: 138 mmol/L (ref 135–145)

## 2023-10-15 LAB — HEPATIC FUNCTION PANEL
ALT: 26 U/L (ref 0–44)
AST: 25 U/L (ref 15–41)
Albumin: 4 g/dL (ref 3.5–5.0)
Alkaline Phosphatase: 80 U/L (ref 38–126)
Bilirubin, Direct: <0.1 mg/dL (ref 0.0–0.2)
Total Bilirubin: 0.7 mg/dL (ref 0.0–1.2)
Total Protein: 6.7 g/dL (ref 6.5–8.1)

## 2023-10-15 LAB — TSH: TSH: 2.31 u[IU]/mL (ref 0.350–4.500)

## 2023-10-15 NOTE — Patient Instructions (Addendum)
 Medication Instructions:   Your physician recommends that you continue on your current medications as directed. Please refer to the Current Medication list given to you today.   *If you need a refill on your cardiac medications before your next appointment, please call your pharmacy*   Lab Work:  LABS TO BE DRAWN ALSO : LIPIDS AND TSH   If you have labs (blood work) drawn today and your tests are completely normal, you will receive your results only by: MyChart Message (if you have MyChart) OR A paper copy in the mail If you have any lab test that is abnormal or we need to change your treatment, we will call you to review the results.   Testing/Procedures: NONE ORDERED  TODAY     Follow-Up: At [facility], you and your health needs are our priority.  As part of our continuing mission to provide you with exceptional heart care, our providers are all part of one team.  This team includes your primary Cardiologist (physician) and Advanced Practice Providers or APPs (Physician Assistants and Nurse Practitioners) who all work together to provide you with the care you need, when you need it.  Your next appointment:   AS SCHEDULED    Provider:   Creighton Doffing, NP    We recommend signing up for the patient portal called "MyChart".  Sign up information is provided on this After Visit Summary.  MyChart is used to connect with patients for Virtual Visits (Telemedicine).  Patients are able to view lab/test results, encounter notes, upcoming appointments, etc.  Non-urgent messages can be sent to your provider as well.   To learn more about what you can do with MyChart, go to [email or web address].   Other Instructions

## 2023-10-15 NOTE — Addendum Note (Signed)
 Encounter addended by: Moyses Pavey B, RN on: 10/15/2023 9:44 AM  Actions taken: Order list changed, Diagnosis association updated

## 2023-10-15 NOTE — Telephone Encounter (Signed)
 Copied from CRM [id number]. Topic: Clinical - Medication Refill >> Oct 14, 2023 10:33 AM Emmet Harm C wrote: Medication: vitamin D3 (CHOLECALCIFEROL ) 25 MCG tablet   Has the patient contacted their pharmacy? Yes (Agent: If no, request that the patient contact the pharmacy for the refill. If patient does not wish to contact the pharmacy document the reason why and proceed with request.) (Agent: If yes, when and what did the pharmacy advise?)  This is the patient's preferred pharmacy:  [id number] - [place], [place] - [address] AT [facility] [address] [place] [address] Phone: [phone] Fax: [phone]  Is this the correct pharmacy for this prescription? Yes If no, delete pharmacy and type the correct one.   Has the prescription been filled recently? No  Is the patient out of the medication? Yes  Has the patient been seen for an appointment in the last year OR does the patient have an upcoming appointment? Yes  Can we respond through MyChart? No  Agent: Please be advised that Rx refills may take up to 3 business days. We ask that you follow-up with your pharmacy.

## 2023-10-16 ENCOUNTER — Ambulatory Visit (INDEPENDENT_AMBULATORY_CARE_PROVIDER_SITE_OTHER)

## 2023-10-16 DIAGNOSIS — I469 Cardiac arrest, cause unspecified: Secondary | ICD-10-CM | POA: Diagnosis not present

## 2023-10-16 LAB — CUP PACEART REMOTE DEVICE CHECK
Battery Remaining Longevity: 108 mo
Battery Remaining Percentage: 95 %
Battery Voltage: 3.01 V
Brady Statistic RV Percent Paced: 0 %
Date Time Interrogation Session: 20250521020359
HighPow Impedance: 57 Ohm
Implantable Lead Connection Status: 753985
Implantable Lead Implant Date: 20250404
Implantable Lead Location: 753860
Implantable Pulse Generator Implant Date: 20250404
Lead Channel Impedance Value: 380 Ohm
Lead Channel Pacing Threshold Amplitude: 1.5 V
Lead Channel Pacing Threshold Pulse Width: 0.5 ms
Lead Channel Sensing Intrinsic Amplitude: 7.3 mV
Lead Channel Setting Pacing Amplitude: 3.5 V
Lead Channel Setting Pacing Pulse Width: 0.5 ms
Lead Channel Setting Sensing Sensitivity: 0.5 mV
Pulse Gen Serial Number: 211022042
Zone Setting Status: 755011

## 2023-10-17 ENCOUNTER — Ambulatory Visit: Payer: Self-pay | Admitting: Cardiology

## 2023-10-18 DIAGNOSIS — N319 Neuromuscular dysfunction of bladder, unspecified: Secondary | ICD-10-CM | POA: Diagnosis not present

## 2023-10-18 DIAGNOSIS — R350 Frequency of micturition: Secondary | ICD-10-CM | POA: Diagnosis not present

## 2023-10-18 DIAGNOSIS — R3914 Feeling of incomplete bladder emptying: Secondary | ICD-10-CM | POA: Diagnosis not present

## 2023-10-22 ENCOUNTER — Encounter: Payer: Self-pay | Admitting: Endocrinology

## 2023-10-22 ENCOUNTER — Telehealth (INDEPENDENT_AMBULATORY_CARE_PROVIDER_SITE_OTHER): Payer: Medicare PPO | Admitting: Endocrinology

## 2023-10-22 DIAGNOSIS — E1065 Type 1 diabetes mellitus with hyperglycemia: Secondary | ICD-10-CM

## 2023-10-22 DIAGNOSIS — E063 Autoimmune thyroiditis: Secondary | ICD-10-CM

## 2023-10-22 NOTE — Progress Notes (Signed)
 Outpatient Endocrinology Note [place] Roben Schliep, MD  10/22/23  Patient's Name: Johnathan Ross    DOB: 08/16/1970    MRN: [id number]                                                    REASON OF VISIT: Follow up for type 1 diabetes mellitus  I connected with  Farid E Francisco on 10/22/23 by a video enabled telemedicine application and verified that I am speaking with the correct person using two identifiers.   I discussed the limitations of evaluation and management by telemedicine. The patient expressed understanding and agreed to proceed.   Provider : at office Patient : at home  PCP: Arcadio Knuckles, MD  HISTORY OF PRESENT ILLNESS:   MARQUON ALCALA is a 53 y.o. old male with past medical history listed below, is here for follow up of type 1 diabetes mellitus and hypothyroidism.  Pertinent Diabetes History: Patient was previously seen by Dr. Hubert Madden and was last time seen in June 2024. _Diagnosed as diabetes melitis type 1 in 1995.  He has been on insulin  pump therapy since 2012.  On Medtronic 780G since September 2023.  Chronic Diabetes Complications : Retinopathy: yes. Last ophthalmology exam was done on every 6 months  Nephropathy: no, on losartan  Peripheral neuropathy: yes, from MS, on gabapentin  Coronary artery disease: no Stroke: no  Relevant comorbidities and cardiovascular risk factors: Obesity: no There is no height or weight on file to calculate BMI.  Hypertension: yes Hyperlipidemia. Yes, on statin.   Current / Home Diabetic regimen includes:  Medtronic 780G using NovoLog  U100  Insulin  Pump setting:  Basal MN- 0.125 u/hour 4AM- 1.25  6AM- 1.05 9AM- 1.25 9 PM-   0.125 10PM-  0.100  Bolus CHO Ratio (1unit:CHO) MN- 1:15 7AM- 1:8 12PM- 1:8.5 6PM- 1:12 10PM-  1:20  Correction/Sensitivity: MN- 1:35  Target: 130  Active insulin  time: 2 hours  Prior diabetic medications:   CONTINUOUS GLUCOSE MONITORING SYSTEM (CGMS) / INSULIN  PUMP INTERPRETATION:                      Medtronic Pump & Sensor Download (Reviewed and summarized below.) Medtronic 780G Date: May 11 - Oct 19, 2023, 14 day GMI : 6.9% Average glucose : 151 Smart guard mode : 97% Sensor usage : 96% Total daily dose 65 units, basal 34%, bolus 66%    Interpretation: Mostly acceptable blood sugar.  Mild hyperglycemia postprandially especially with breakfast and sometime after supper related to late meal bolus.  Appropriate improvement on hyperglycemia with auto correction with the Smart guard and basal rates.  Some of the days mostly acceptable blood sugar, with no concerning hyperglycemia.  No concerning hypoglycemia, in between the meals and overnight.  Hypoglycemia: Patient has no concerning hypoglycemic episodes. Patient has hypoglycemia awareness.    Factors modifying glucose control: 1.  Diabetic diet assessment: 3 meals a day.  2.  Staying active or exercising: Not able to exercise.  3.  Medication compliance: compliant all of the time.  # Primary hypothyroidism  -Longstanding history of primary 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 autoimmune.  Currently taking levothyroxine  / synthroid  125 mcg 6 days a week.  # He has multiple sclerosis, following with neurology.  Interval history  Virtual video visit today.  Pump and CGM data as reviewed above.  Mostly  acceptable blood sugar.  Pump setting was changed during last visit.  GMI on CGM 6.9%.  He has been taking Synthroid /levothyroxine  125 mcg daily for 6 days a week.  1 hyperthyroid symptoms.  He had normal TSH recently 2.310.  No other complaints today.  REVIEW OF SYSTEMS As per history of present illness.   PAST MEDICAL HISTORY: Past Medical History:  Diagnosis Date   Hyperlipidemia    Hypertension    Hypogonadism in male 11/01/2016   Hypothyroidism    Multiple sclerosis (HCC)    Neuromuscular disorder (HCC)    Proliferative diabetic retinopathy(362.02)    Type 1 diabetes mellitus (HCC) dx'd 1994   Vitamin D   insufficiency 11/29/2016    PAST SURGICAL HISTORY: Past Surgical History:  Procedure Laterality Date   EYE SURGERY Bilateral    "laser for diabetic retinopathy"   FRACTURE SURGERY     ICD IMPLANT N/A 08/30/2023   Procedure: ICD IMPLANT;  Surgeon: Lei Pump, MD;  Location: [facility];  Service: Cardiovascular;  Laterality: N/A;   IR VENO/EXT/UNI LEFT  03/24/2020   OPEN REDUCTION INTERNAL FIXATION (ORIF) TIBIA/FIBULA FRACTURE Right 10/30/2016   ORIF ANKLE FRACTURE Left 2015   ORIF TIBIA FRACTURE Right 10/30/2016   Procedure: OPEN REDUCTION INTERNAL FIXATION (ORIF) TIBIA FIBULA FRACTURE;  Surgeon: Hardy Lia, MD;  Location: [facility] OR;  Service: Orthopedics;  Laterality: Right;   RETINAL LASER PROCEDURE Bilateral    "for diabetic retinopathy"   RIGHT/LEFT HEART CATH AND CORONARY ANGIOGRAPHY N/A 08/26/2023   Procedure: RIGHT/LEFT HEART CATH AND CORONARY ANGIOGRAPHY;  Surgeon: Alwin Baars, DO;  Location: [facility];  Service: Cardiovascular;  Laterality: N/A;    ALLERGIES: Allergies  Allergen Reactions   Zestril  [Lisinopril ] Cough   Ivp Dye [Iodinated Contrast Media] Hives and Itching    FAMILY HISTORY:  Family History  Problem Relation Age of Onset   Diabetes Sister    Colon polyps Maternal Uncle    Colon cancer Maternal Grandmother    Stomach cancer Cousin    Esophageal cancer Neg Hx     SOCIAL HISTORY: Social History   Socioeconomic History   Marital status: Single    Spouse name: 12   Number of children: 1   Years of education: Not on file   Highest education level: Not on file  Occupational History   Occupation: unemployed  Tobacco Use   Smoking status: Never    Passive exposure: Never   Smokeless tobacco: Never  Vaping Use   Vaping status: Never Used  Substance and Sexual Activity   Alcohol use: No   Drug use: No   Sexual activity: Not on file  Other Topics Concern   Not on file  Social History Narrative   Right handed   No  caffeine    Lives with girlfriend   Social Drivers of Health   Financial Resource Strain: Low Risk  (09/11/2023)   Overall Financial Resource Strain (CARDIA)    Difficulty of Paying Living Expenses: Not hard at all  Food Insecurity: No Food Insecurity (09/11/2023)   Hunger Vital Sign    Worried About Running Out of Food in the Last Year: Never true    Ran Out of Food in the Last Year: Never true  Transportation Needs: No Transportation Needs (09/11/2023)   PRAPARE - Administrator, Civil Service (Medical): No    Lack of Transportation (Non-Medical): No  Physical Activity: Sufficiently Active (09/11/2023)   Exercise Vital Sign    Days  of Exercise per Week: 5 days    Minutes of Exercise per Session: 30 min  Stress: No Stress Concern Present (09/11/2023)   [employer] of Occupational Health - Occupational Stress Questionnaire    Feeling of Stress : Not at all  Social Connections: Socially Isolated (09/11/2023)   Social Connection and Isolation Panel [NHANES]    Frequency of Communication with Friends and Family: More than three times a week    Frequency of Social Gatherings with Friends and Family: More than three times a week    Attends Religious Services: Never    Database administrator or Organizations: No    Attends Engineer, structural: Never    Marital Status: Never married    MEDICATIONS:  Current Outpatient Medications  Medication Sig Dispense Refill   acetaminophen  (TYLENOL ) 325 MG tablet Take 1-2 tablets (325-650 mg total) by mouth every 4 (four) hours as needed for mild pain (pain score 1-3).     amiodarone  (PACERONE ) 200 MG tablet Take 1 tablet (200 mg total) by mouth daily. 90 tablet 0   aspirin  EC 81 MG tablet Take 1 tablet (81 mg total) by mouth daily. Swallow whole.     bisoprolol  (ZEBETA ) 5 MG tablet Take 1 tablet (5 mg total) by mouth daily. 30 tablet 6   dalfampridine  10 MG TB12 Take 1 tablet (10 mg total) by mouth 2 (two) times daily. 180  tablet 3   furosemide  (LASIX ) 20 MG tablet Take 1 tablet (20 mg total) by mouth as needed for fluid or edema. For weight gain of 3lb in 24 hours or 5 lb in a week 30 tablet 1   Insulin  Human (INSULIN  PUMP) SOLN Inject 1 each into the skin 3 times daily with meals, bedtime and 2 AM.     isosorbide -hydrALAZINE  (BIDIL ) 20-37.5 MG tablet Take 0.5 tablets by mouth 3 (three) times daily. 45 tablet 6   levocetirizine (XYZAL ) 5 MG tablet TAKE 1 TABLET BY MOUTH EVERY DAY IN THE EVENING 90 tablet 1   levothyroxine  (SYNTHROID ) 125 MCG tablet Take 1 tablet (125 mcg total) by mouth daily at 6 (six) AM. 90 tablet 0   losartan  (COZAAR ) 25 MG tablet Take 0.5 tablets (12.5 mg total) by mouth daily. 45 tablet 3   polyethylene glycol (MIRALAX  / GLYCOLAX ) 17 g packet Take 17 g by mouth daily.     rOPINIRole  (REQUIP ) 0.25 MG tablet Take 1 tablet (0.25 mg total) by mouth at bedtime. 90 tablet 0   senna-docusate (SENOKOT-S) 8.6-50 MG tablet Take 2 tablets by mouth 2 (two) times daily.     simvastatin  (ZOCOR ) 20 MG tablet Take 1 tablet (20 mg total) by mouth daily at 6 PM. 90 tablet 0   vitamin D3 (CHOLECALCIFEROL ) 25 MCG tablet Take 1 tablet (1,000 Units total) by mouth daily. 30 tablet 0   No current facility-administered medications for this visit.    PHYSICAL EXAM: There were no vitals filed for this visit.  There is no height or weight on file to calculate BMI.  Wt Readings from Last 3 Encounters:  10/15/23 198 lb (89.8 kg)  10/08/23 212 lb 3.2 oz (96.3 kg)  09/16/23 213 lb 3.2 oz (96.7 kg)    General: Well developed, well nourished male in no apparent distress.  HEENT: AT/[place], no external lesions.  Eyes: Conjunctiva clear and no icterus. Neurologic: Alert, oriented, normal speech Psychiatric: Does not appear depressed or anxious  Diabetic Foot Exam - Simple   No data filed  LABS Reviewed Lab Results  Component Value Date   HGBA1C 7.3 (H) 07/08/2023   HGBA1C 7.5 (H) 03/15/2023   HGBA1C 7.4 (H)  10/26/2022   Lab Results  Component Value Date   FRUCTOSAMINE 303 (H) 07/08/2023   FRUCTOSAMINE 383 (H) 12/03/2019   FRUCTOSAMINE 355 (H) 08/27/2018   Lab Results  Component Value Date   CHOL 136 07/08/2023   HDL 50 07/08/2023   LDLCALC 72 07/08/2023   LDLDIRECT 65.0 02/09/2021   TRIG 55 08/27/2023   CHOLHDL 2.7 07/08/2023   Lab Results  Component Value Date   MICRALBCREAT 0.5 06/17/2023   MICRALBCREAT 0.5 08/28/2021   Lab Results  Component Value Date   CREATININE 1.16 10/15/2023   Lab Results  Component Value Date   GFR 74.02 06/17/2023    ASSESSMENT / PLAN  1. Uncontrolled type 1 diabetes mellitus with hyperglycemia (HCC)   2. Acquired autoimmune hypothyroidism       Diabetes Mellitus type 1, complicated by diabetic retinopathy, possibly neuropathy. - Diabetic status / severity: fair controlled.  Lab Results  Component Value Date   HGBA1C 7.3 (H) 07/08/2023    - Hemoglobin A1c goal <6.5%   GMI on CGM 6.9%.  No labs planned prior to this follow-up visit.  CGM and pump data reviewed and mostly acceptable.  - Medications:  Insulin  pump setting no change in the pump setting today.  Continue Medtronic 780G using NovoLog  U100  - Home glucose testing: continue CGM and check blood glucose as needed.  - Discussed/ Gave Hypoglycemia treatment plan.  # Consult : not required at this time.   # Annual urine for microalbuminuria/ creatinine ratio, no microalbuminuria currently, continue ACE/ARB /losartan .  Will check in next visit. Last  Lab Results  Component Value Date   MICRALBCREAT 0.5 06/17/2023    # Foot check nightly / neuropathy, continue gabapentin .  Managed by neurology/PCP.  # He has proliferative diabetic retinopathy, regularly following with ophthalmology..   - Diet: Make healthy diabetic food choices.  Discussed about reducing carb content in the meals.  2. Blood pressure  -  BP Readings from Last 1 Encounters:  10/15/23 136/78    -  Control is in target.  - No change in current plans.  3. Lipid status / Hyperlipidemia - Last  Lab Results  Component Value Date   LDLCALC 72 07/08/2023   - Continue simvastatin  20 mg daily.  # Primary hypothyroidism -Continue Synthroid  125 mg 6 days a week.  Recent TSH normal.  Diagnoses and all orders for this visit:  Uncontrolled type 1 diabetes mellitus with hyperglycemia (HCC) -     Basic Metabolic Panel Without GFR -     Hemoglobin A1c  Acquired autoimmune hypothyroidism   DISPOSITION Follow up in clinic in 3 months suggested.  He prefers to have video virtual visit.  Labs as order prior to follow-up visit.   All questions answered and patient verbalized understanding of the plan.  [place] Diondre Pulis, MD [facility] [address] [place], [address] [place], [place] [address] Phone # [phone]  At least part of this note was generated using voice recognition software. Inadvertent word errors may have occurred, which were not recognized during the proofreading process.

## 2023-10-24 ENCOUNTER — Ambulatory Visit: Payer: Medicare PPO | Admitting: Neurology

## 2023-10-28 ENCOUNTER — Ambulatory Visit: Attending: Neurology | Admitting: Physical Therapy

## 2023-10-28 ENCOUNTER — Encounter: Payer: Self-pay | Admitting: Physical Therapy

## 2023-10-28 DIAGNOSIS — R29898 Other symptoms and signs involving the musculoskeletal system: Secondary | ICD-10-CM | POA: Diagnosis not present

## 2023-10-28 DIAGNOSIS — R262 Difficulty in walking, not elsewhere classified: Secondary | ICD-10-CM | POA: Insufficient documentation

## 2023-10-28 DIAGNOSIS — R2681 Unsteadiness on feet: Secondary | ICD-10-CM | POA: Insufficient documentation

## 2023-10-28 DIAGNOSIS — M21371 Foot drop, right foot: Secondary | ICD-10-CM | POA: Diagnosis not present

## 2023-10-28 DIAGNOSIS — M25511 Pain in right shoulder: Secondary | ICD-10-CM | POA: Insufficient documentation

## 2023-10-28 DIAGNOSIS — R2689 Other abnormalities of gait and mobility: Secondary | ICD-10-CM | POA: Insufficient documentation

## 2023-10-28 DIAGNOSIS — M21372 Foot drop, left foot: Secondary | ICD-10-CM | POA: Diagnosis not present

## 2023-10-28 DIAGNOSIS — R29818 Other symptoms and signs involving the nervous system: Secondary | ICD-10-CM | POA: Diagnosis not present

## 2023-10-28 DIAGNOSIS — M6281 Muscle weakness (generalized): Secondary | ICD-10-CM | POA: Diagnosis not present

## 2023-10-28 DIAGNOSIS — G8929 Other chronic pain: Secondary | ICD-10-CM | POA: Diagnosis present

## 2023-10-28 NOTE — Patient Instructions (Addendum)
 Access Code: [email or web address] URL: [email or web address] Date: 10/28/2023 Prepared by: Marilou Showman  Exercises - Supine Thoracic Mobilization Towel Roll Vertical with Arm Stretch  - 1 x daily - 5 x weekly - 1 sets - 2 minutes hold - Supine Lower Trunk Rotation  - 1 x daily - 5 x weekly - 2 sets - 10 reps - Seated Gastroc Stretch with Strap  - 1 x daily - 7 x weekly - 2 sets - 60 second hold - Standing Gastroc Stretch at Counter  - 2 x daily - 7 x weekly - 1 sets - 3 reps - 30 sec hold - Heel Sits  - 1 x daily - 5 x weekly - 1 sets - 12 reps - Tall Kneeling Diagonal Lift with Medicine Ball  - 1 x daily - 5 x weekly - 1 sets - 5-10 reps - Marching Bridge  - 1 x daily - 5 x weekly - 1 sets - 6 reps - Clamshell  - 1 x daily - 5 x weekly - 1-2 sets - 10 reps - Long Sitting Hamstring Stretch  - 1 x daily - 5 x weekly - 1 sets - 2-3 reps - 30-45 seconds hold - Seated Knee Flexion  - 1 x daily - 5 x weekly - 1-2 sets - 4 reps - Seated Hip Abduction with Resistance  - 1 x daily - 5 x weekly - 2 sets - 10 reps

## 2023-10-28 NOTE — Therapy (Signed)
 OUTPATIENT PHYSICAL THERAPY NEURO TREATMENT   Patient Name: Johnathan Ross MRN: [id number] DOB:[birth year], 53 y.o., male Today's Date: 10/28/2023   PCP: Arcadio Knuckles, MD  REFERRING PROVIDER: Sterling Eisenmenger, PA-C  END OF SESSION:  PT End of Session - 10/28/23 0936     Visit Number 2    Number of Visits 13   12 + eval   Date for PT Re-Evaluation 12/20/23   pushed out due to scheduling delay   Authorization Type [employer]    PT Start Time 0930    PT Stop Time 1020    PT Time Calculation (min) 50 min    Equipment Utilized During Treatment Gait belt    Activity Tolerance Patient tolerated treatment well    Behavior During Therapy [facility] for tasks assessed/performed             Past Medical History:  Diagnosis Date   Hyperlipidemia    Hypertension    Hypogonadism in male 11/01/2016   Hypothyroidism    Multiple sclerosis (HCC)    Neuromuscular disorder (HCC)    Proliferative diabetic retinopathy(362.02)    Type 1 diabetes mellitus (HCC) dx'd 1994   Vitamin D  insufficiency 11/29/2016   Past Surgical History:  Procedure Laterality Date   EYE SURGERY Bilateral    "laser for diabetic retinopathy"   FRACTURE SURGERY     ICD IMPLANT N/A 08/30/2023   Procedure: ICD IMPLANT;  Surgeon: Lei Pump, MD;  Location: [facility] INVASIVE CV [facility];  Service: Cardiovascular;  Laterality: N/A;   IR VENO/EXT/UNI LEFT  03/24/2020   OPEN REDUCTION INTERNAL FIXATION (ORIF) TIBIA/FIBULA FRACTURE Right 10/30/2016   ORIF ANKLE FRACTURE Left 2015   ORIF TIBIA FRACTURE Right 10/30/2016   Procedure: OPEN REDUCTION INTERNAL FIXATION (ORIF) TIBIA FIBULA FRACTURE;  Surgeon: Hardy Lia, MD;  Location: [facility] OR;  Service: Orthopedics;  Laterality: Right;   RETINAL LASER PROCEDURE Bilateral    "for diabetic retinopathy"   RIGHT/LEFT HEART CATH AND CORONARY ANGIOGRAPHY N/A 08/26/2023   Procedure: RIGHT/LEFT HEART CATH AND CORONARY ANGIOGRAPHY;  Surgeon: Alwin Baars, DO;  Location: [facility]  INVASIVE CV LAB;  Service: Cardiovascular;  Laterality: N/A;   Patient Active Problem List   Diagnosis Date Noted   Attention and concentration deficit 09/10/2023   Acute encephalopathy 09/03/2023   Cardiac arrest (HCC) 08/26/2023   Encounter for screening for HIV 06/17/2023   Need for immunization against influenza 06/17/2023   Loud snoring 06/17/2023   Encounter for general adult medical examination with abnormal findings 08/02/2022   Screening for prostate cancer 08/01/2021   OAB (overactive bladder) 09/21/2020   Essential hypertension 09/21/2020   Thoracic outlet syndrome 05/03/2020   Controlled type 1 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 (HCC) 03/24/2020   Right epiretinal membrane 03/24/2020   Retinal exudates and deposits 03/24/2020   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   Nuclear sclerotic cataract of both eyes 03/24/2020   Gait disorder 03/23/2020   Benign prostatic hyperplasia without lower urinary tract symptoms 03/01/2020   Diabetic gastroparesis associated with type 1 diabetes mellitus (HCC) 03/01/2020   Gait disturbance 08/19/2019   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/19/2019   Vitamin D  insufficiency 11/29/2016   Hypogonadism in male 11/01/2016   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HCC)    Hypothyroidism    Hypertension    Dyslipidemia, goal LDL below 130    MULTIPLE SCLEROSIS 08/29/2007   PROLIFERATIVE DIABETIC RETINOPATHY 08/29/2007    ONSET DATE: 08/26/2023  REFERRING DIAG: G93.40 (ICD-10-CM) -  Acute encephalopathy  THERAPY DIAG:  Other abnormalities of gait and mobility  Muscle weakness (generalized)  Other symptoms and signs involving the nervous system  Other symptoms and signs involving the musculoskeletal system  Unsteadiness on feet  Difficulty in walking, not elsewhere classified  Foot drop, right  Foot drop, left  Chronic right shoulder pain  Rationale for Evaluation and Treatment:  Rehabilitation  SUBJECTIVE:                                                                                                                                                                                             SUBJECTIVE STATEMENT: Pt presents to clinic w/RW and bilateral AFOs donned.  He presents alone this visit and demonstrates better speed of gait using reciprocal pattern on entrance to gym.  Pt accompanied by: self and Sister  PERTINENT HISTORY: vitamin D  insufficiency, Type 1 IDDM w/ polyneuropathy, diabetic retinopathy, hypothyroidism, HTN, hypercholestrolemia, h/o Rt tibia fracture, h/o Lt ankle fracture with ORIF, MS with acquired diplegia (MS diagnosis 2014/2015)  In the week prior to his cardiac arrest he reported having some near syncopal episodes.  On 3/31 he went into witnessed cardiac arrest w/ bystander performing CPR for 20-25 minutes.  Per EMS notes he had persistent V-fib requiring 8 total defibrillations, 2 doses of epi and some ROSC w/ V tach.  He was also given Narcan in the field due to small pupils.  PAIN:  Are you having pain? No  PRECAUTIONS: Fall and ICD/Pacemaker (Can raise R shoulder as of May 9th - 6wks post placement of ICD; monitor BP - if systolic drops below 100 he is to follow-up with Cardiologist about medication management)  RED FLAGS: None   WEIGHT BEARING RESTRICTIONS: No  FALLS: Has patient fallen in last 6 months? No  LIVING ENVIRONMENT: Lives with: lives with their partner Lives in: House/apartment Stairs: Yes: External: 5 steps; on right going up, on left going up, and can reach both Has following equipment at home: Single point cane, Walker - 2 wheeled, Wheelchair (manual), and shower chair  PLOF: Requires assistive device for independence and Needs assistance with homemaking  PATIENT GOALS: "fix my brace and if there's anything we can do for my shoulder"   OBJECTIVE:  Note: Objective measures were completed at Evaluation unless  otherwise noted.  DIAGNOSTIC FINDINGS:  MRI Cardiac 08/29/2023: IMPRESSION: 1. Mild to moderate decrease in left ventricular systolic function (LVEF =40%).   2. There is no late gadolinium enhancement in the left ventricular myocardium. Normal parametric mapping and tissue characterization.   3. Mild decrease in right ventricular systolic function (RVEF =45%).  CT  Ross 08/26/2023: IMPRESSION: 1. No evidence of acute intracranial abnormality. 2. White matter lesions better characterized on prior MRI.  CT Angio Chest PE 08/26/2023: IMPRESSION: 1. No pulmonary embolism. 2. Moderate left anterior descending coronary artery calcification. 3. Extensive dependent pulmonary consolidation throughout the lungs which may relate to aspiration or multifocal infection.  *Updated imaging ordered by Dr. Godwin Lat (MRI of brain and C-spine)   COGNITION: Overall cognitive status: Within functional limits for tasks assessed   SENSATION: Light touch: WFL   MUSCLE TONE: Increased extensor tone of LLE >RLE but ROM and coordination limited bilaterally due to extensor tone/spasticity    POSTURE: rounded shoulders, forward Ross, and weight shift right  LOWER EXTREMITY ROM:   Limited by extensor tone/spasticity   Active  Right Eval Left Eval  Hip flexion    Hip extension    Hip abduction    Hip adduction    Hip internal rotation    Hip external rotation    Knee flexion    Knee extension    Ankle dorsiflexion    Ankle plantarflexion    Ankle inversion    Ankle eversion     (Blank rows = not tested)  LOWER EXTREMITY MMT:  Tested in seated position   MMT Right Eval Left Eval  Hip flexion 1 1  Hip extension    Hip abduction 4- 4-  Hip adduction    Hip internal rotation    Hip external rotation    Knee flexion 2 3-  Knee extension 3+ 1  Ankle dorsiflexion AFO donned AFO donned  Ankle plantarflexion    Ankle inversion    Ankle eversion    (Blank rows = not tested)  BED MOBILITY:   Independent per pt - still in a standard bed, needs increased time to roll L and R  TRANSFERS: Assistive device utilized: Environmental consultant - 2 wheeled  Sit to stand: SBA Stand to sit: SBA Heavy reliance on BUEs to essentially "throw" himself out of chair, wide BOS, better hip extension today when standing to 2WW, when he lets go he maintains anterior lean in attempt to prevent knees locking out, when knees lock out he loses his balance.   GAIT: Gait pattern: step to pattern, decreased step length- Left, decreased stride length, decreased hip/knee flexion- Right, decreased hip/knee flexion- Left, decreased ankle dorsiflexion- Right, decreased ankle dorsiflexion- Left, lateral hip instability, trunk flexed, narrow BOS, and poor foot clearance- Left Distance walked: Various clinic distances  Assistive device utilized: Walker - 2 wheeled Level of assistance: SBA Comments: Pt required 9 minutes to ambulate into treatment room (~30') , frequently stopping to rest as his "legs lock up". Noted excessive inversion of L ankle as well as scissoring of LLE. Pt relying heavily on BUEs to perform more of a swing-to pattern.   FUNCTIONAL TESTS:  TREATMENT:   -Child's pose - mild right shoulder discomfort so removed from HEP, edu on doing this for gentle stretch if not having pain in shoulders -Thoracic towel mob w/ chest stretch x2 minutes -Supine LTR x20, cued for slowed pace to engage core, pt reports low back and lateral core stretch -Removed anterior shoulder and bicep stretch due to severe anterior shoulder pain getting into position -Bridge march x4- pt needs rest b/w reps to improve motor control, discussed safety at home to prevent injury with higher level strengthening task -Verbally reviewed heel sit and tall kneeling chop - pt does this everyday w/o issue -Side-lying  clamshells x10 each, discussed ways to improve and maintain form at home (place feet against wall to prevent slipping) -Scalene stretch w/ towel x45 sec each side, pt has no tightness in correct form so removed from HEP -Reviewed setup for sitting vs standing gastroc stretch, discussed using at least one of these stretches for tone management and to prevent secondary contracture for maintained AFO use.  He is not doing this stretch at current, but is independent in setup. -Tried anchored hamstring curl w/o adequate ROM so regressed to towel slides w/ continued difficulty (moreso w/ knee extension) to fatigue each side -Seated hip abduction w/ red theraband at thighs x14  PATIENT EDUCATION: Education details: Positioning in bed for anterior shoulder comfort.  Condensed HEP - reprinted new copy.  TPDN at next appt if certified therapist feels it appropriate. Person educated: Patient and Sister Education method: Explanation Education comprehension: needs further education  HOME EXERCISE PROGRAM:  Access Code: [email or web address] URL: [email or web address] Date: 10/28/2023 Prepared by: Marilou Showman  Exercises - Supine Thoracic Mobilization Towel Roll Vertical with Arm Stretch  - 1 x daily - 5 x weekly - 1 sets - 2 minutes hold - Supine Lower Trunk Rotation  - 1 x daily - 5 x weekly - 2 sets - 10 reps - Seated Gastroc Stretch with Strap  - 1 x daily - 7 x weekly - 2 sets - 60 second hold - Standing Gastroc Stretch at Counter  - 2 x daily - 7 x weekly - 1 sets - 3 reps - 30 sec hold - Heel Sits  - 1 x daily - 5 x weekly - 1 sets - 12 reps - Tall Kneeling Diagonal Lift with Medicine Ball  - 1 x daily - 5 x weekly - 1 sets - 5-10 reps - Marching Bridge  - 1 x daily - 5 x weekly - 1 sets - 6 reps - Clamshell  - 1 x daily - 5 x weekly - 1-2 sets - 10 reps - Long Sitting Hamstring Stretch  - 1 x daily - 5 x weekly - 1 sets - 2-3 reps - 30-45 seconds hold - Seated Knee Flexion  - 1 x daily -  5 x weekly - 1-2 sets - 4 reps - Seated Hip Abduction with Resistance  - 1 x daily - 5 x weekly - 2 sets - 10 reps  GOALS: Goals reviewed with patient? Yes  SHORT TERM GOALS: Target date: 11/08/2023  PT will review, condense and modify HEP to meet current functional level. Baseline: to be reviewed from previous POC Goal status: INITIAL   Patient will obtain cooling vest from [employer] to improve ability to continue activity in warmer months. Baseline: PT will fax request to [employer] if pt unable to obtain on his own. Goal status: INITIAL   [employer] rep and PT to further assess  and adjust left AFO if able to improve seating into brace for improved utilization. Baseline: PT to contact [employer]. Goal status: INITIAL  LONG TERM GOALS: Target date: 12/06/2023   Patient will tolerate low to moderate level cardio focused exercise for >/=10 minutes w/ RPE of no more than 5/10 in order to demonstrate tolerance to steady state activity. Baseline: To be introduced. Goal status: INITIAL  Patient will verbalize strategies for energy conservation to improve compliance to safety recommendations. Baseline: To be reviewed. Goal status: INITIAL  Patient will report no right shoulder pain with light activity in order to improve functional use of UE. Baseline: 1-2/10 w/ repetition Goal status: INITIAL  4.  Patient will demonstrate unsupported standing x30 seconds without LOB in order to improve upright safety and functional participation in ADLs. Baseline:  BLE lockout w/ LOB  Goal status:  INITIAL  5.  Patient will demonstrate reach to floor from standing at no more than supervision level to improve functional upright mobility and independence. Baseline:  Cannot reach to floor without BLE collapsing  Goal status:  INITIAL  ASSESSMENT:  CLINICAL IMPRESSION: Emphasis of skilled session on reviewing and condensing HEP to meet patient's current functional level.  He continues to have positional  right shoulder pain mostly with internal rotation and extension.  He continues to benefit from skilled PT to improve pain management and preservation of shoulders as relevant to gait pattern as well as cardiac endurance relevant to recent hospitalization.  Will continue per POC.   OBJECTIVE IMPAIRMENTS: Abnormal gait, cardiopulmonary status limiting activity, decreased activity tolerance, decreased balance, decreased cognition, decreased coordination, decreased endurance, decreased knowledge of condition, decreased knowledge of use of DME, decreased mobility, difficulty walking, decreased ROM, decreased strength, decreased safety awareness, impaired perceived functional ability, increased muscle spasms, impaired sensation, impaired tone, improper body mechanics, and pain  ACTIVITY LIMITATIONS: carrying, lifting, bending, standing, squatting, stairs, transfers, reach over Ross, locomotion level, and caring for others  PARTICIPATION LIMITATIONS: cleaning, driving, shopping, community activity, occupation, and yard work  PERSONAL FACTORS: Behavior pattern, Education, Fitness, Past/current experiences, and 1-2 comorbidities: MS and significant cardiac involvement are also affecting patient's functional outcome.   REHAB POTENTIAL: Fair see personal factors and PMH  CLINICAL DECISION MAKING: Unstable/unpredictable  EVALUATION COMPLEXITY: High  PLAN:  PT FREQUENCY: 1-2x/week (2x/wk for initial 4 wks and 1x/wk for last 4 wks)  PT DURATION: 8 weeks  PLANNED INTERVENTIONS: 97164- PT Re-evaluation, 97750- Physical Performance Testing, 97110-Therapeutic exercises, 97530- Therapeutic activity, [id number]- Neuromuscular re-education, 97535- Self Care, 96045- Manual therapy, [id number]- Gait training, [phone]- Orthotic Initial, [id number]- Orthotic/Prosthetic subsequent, [id number]- Aquatic Therapy, [phone]- Electrical stimulation (manual), Patient/Family education, Balance training, Stair training, Taping, Dry Needling, Joint  mobilization, DME instructions, Wheelchair mobility training, Cryotherapy, and Moist heat  PLAN FOR NEXT SESSION:  Reaching to floor level.  Standing balance, AFO fit - can someone from [employer] come assess left AFO?  Moselle Rister contacted Tawny Fate about this, waiting for appt coordination!  Cardiac focus - modified BORG response and circuit style training.  Rotator cuff rehab - possible TPDN to right shoulder/upper traps.  Earlean Glaze, PT, DPT 10/28/2023, 2:23 PM

## 2023-10-31 ENCOUNTER — Ambulatory Visit: Admitting: Physical Therapy

## 2023-10-31 ENCOUNTER — Other Ambulatory Visit (HOSPITAL_COMMUNITY): Payer: Self-pay | Admitting: Family Medicine

## 2023-10-31 DIAGNOSIS — R2689 Other abnormalities of gait and mobility: Secondary | ICD-10-CM

## 2023-10-31 DIAGNOSIS — M21372 Foot drop, left foot: Secondary | ICD-10-CM

## 2023-10-31 DIAGNOSIS — R2681 Unsteadiness on feet: Secondary | ICD-10-CM

## 2023-10-31 DIAGNOSIS — M21371 Foot drop, right foot: Secondary | ICD-10-CM | POA: Diagnosis not present

## 2023-10-31 DIAGNOSIS — R29898 Other symptoms and signs involving the musculoskeletal system: Secondary | ICD-10-CM | POA: Diagnosis not present

## 2023-10-31 DIAGNOSIS — R29818 Other symptoms and signs involving the nervous system: Secondary | ICD-10-CM

## 2023-10-31 DIAGNOSIS — R262 Difficulty in walking, not elsewhere classified: Secondary | ICD-10-CM | POA: Diagnosis not present

## 2023-10-31 DIAGNOSIS — M6281 Muscle weakness (generalized): Secondary | ICD-10-CM | POA: Diagnosis not present

## 2023-10-31 DIAGNOSIS — M25511 Pain in right shoulder: Secondary | ICD-10-CM | POA: Diagnosis not present

## 2023-10-31 NOTE — Therapy (Signed)
 OUTPATIENT PHYSICAL THERAPY NEURO TREATMENT   Patient Name: Johnathan Ross MRN: [id number] DOB:11/04/1970, 53 y.o., male Today's Date: 10/31/2023   PCP: Arcadio Knuckles, MD  REFERRING PROVIDER: Sterling Eisenmenger, PA-C  END OF SESSION:  PT End of Session - 10/31/23 0934     Visit Number 3    Number of Visits 13   12 + eval   Date for PT Re-Evaluation 12/20/23   pushed out due to scheduling delay   Authorization Type [employer]    PT Start Time 0930    PT Stop Time 1012    PT Time Calculation (min) 42 min    Equipment Utilized During Treatment Gait belt    Activity Tolerance Patient tolerated treatment well    Behavior During Therapy [facility] for tasks assessed/performed              Past Medical History:  Diagnosis Date   Hyperlipidemia    Hypertension    Hypogonadism in male 11/01/2016   Hypothyroidism    Multiple sclerosis (HCC)    Neuromuscular disorder (HCC)    Proliferative diabetic retinopathy(362.02)    Type 1 diabetes mellitus (HCC) dx'd 1994   Vitamin D  insufficiency 11/29/2016   Past Surgical History:  Procedure Laterality Date   EYE SURGERY Bilateral    "laser for diabetic retinopathy"   FRACTURE SURGERY     ICD IMPLANT N/A 08/30/2023   Procedure: ICD IMPLANT;  Surgeon: Lei Pump, MD;  Location: [facility] IN[facility];  Service: Cardiovascular;  Laterality: N/A;   IR VENO/EXT/UNI LEFT  03/24/2020   OPEN REDUCTION INTERNAL FIXATION (ORIF) TIBIA/FIBULA FRACTURE Right 10/30/2016   ORIF ANKLE FRACTURE Left 2015   ORIF TIBIA FRACTURE Right 10/30/2016   Procedure: OPEN REDUCTION INTERNAL FIXATION (ORIF) TIBIA FIBULA FRACTURE;  Surgeon: Hardy Lia, MD;  Location: [facility] OR;  Service: Orthopedics;  Laterality: Right;   RETINAL LASER PROCEDURE Bilateral    "for diabetic retinopathy"   RIGHT/LEFT HEART CATH AND CORONARY ANGIOGRAPHY N/A 08/26/2023   Procedure: RIGHT/LEFT HEART CATH AND CORONARY ANGIOGRAPHY;  Surgeon: Alwin Baars, DO;  Location: [facility]  INVASIVE CV LAB;  Service: Cardiovascular;  Laterality: N/A;   Patient Active Problem List   Diagnosis Date Noted   Attention and concentration deficit 09/10/2023   Acute encephalopathy 09/03/2023   Cardiac arrest (HCC) 08/26/2023   Encounter for screening for HIV 06/17/2023   Need for immunization against influenza 06/17/2023   Loud snoring 06/17/2023   Encounter for general adult medical examination with abnormal findings 08/02/2022   Screening for prostate cancer 08/01/2021   OAB (overactive bladder) 09/21/2020   Essential hypertension 09/21/2020   Thoracic outlet syndrome 05/03/2020   Controlled type 1 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 (HCC) 03/24/2020   Right epiretinal membrane 03/24/2020   Retinal exudates and deposits 03/24/2020   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   Nuclear sclerotic cataract of both eyes 03/24/2020   Gait disorder 03/23/2020   Benign prostatic hyperplasia without lower urinary tract symptoms 03/01/2020   Diabetic gastroparesis associated with type 1 diabetes mellitus (HCC) 03/01/2020   Gait disturbance 08/19/2019   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/19/2019   Vitamin D  insufficiency 11/29/2016   Hypogonadism in male 11/01/2016   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HCC)    Hypothyroidism    Hypertension    Dyslipidemia, goal LDL below 130    MULTIPLE SCLEROSIS 08/29/2007   PROLIFERATIVE DIABETIC RETINOPATHY 08/29/2007    ONSET DATE: 08/26/2023  REFERRING DIAG: G93.40 (  ICD-10-CM) - Acute encephalopathy  THERAPY DIAG:  Other abnormalities of gait and mobility  Muscle weakness (generalized)  Other symptoms and signs involving the nervous system  Other symptoms and signs involving the musculoskeletal system  Unsteadiness on feet  Difficulty in walking, not elsewhere classified  Foot drop, right  Foot drop, left  Rationale for Evaluation and Treatment: Rehabilitation  SUBJECTIVE:                                                                                                                                                                                              SUBJECTIVE STATEMENT: Pt presents to clinic in clinic wheelchair, forgot his RW today.  Denies any acute changes since last visit, ongoing R shoulder pain when he reaches behind his back, otherwise does not have pain.  Pt accompanied by: self and Mom dropped him off  PERTINENT HISTORY: vitamin D  insufficiency, Type 1 IDDM w/ polyneuropathy, diabetic retinopathy, hypothyroidism, HTN, hypercholestrolemia, h/o Rt tibia fracture, h/o Lt ankle fracture with ORIF, MS with acquired diplegia (MS diagnosis 2014/2015)  In the week prior to his cardiac arrest he reported having some near syncopal episodes.  On 3/31 he went into witnessed cardiac arrest w/ bystander performing CPR for 20-25 minutes.  Per EMS notes he had persistent V-fib requiring 8 total defibrillations, 2 doses of epi and some ROSC w/ V tach.  He was also given Narcan in the field due to small pupils.  PAIN:  Are you having pain? No  PRECAUTIONS: Fall and ICD/Pacemaker (Can raise R shoulder as of May 9th - 6wks post placement of ICD; monitor BP - if systolic drops below 100 he is to follow-up with Cardiologist about medication management)  RED FLAGS: None   WEIGHT BEARING RESTRICTIONS: No  FALLS: Has patient fallen in last 6 months? No  LIVING ENVIRONMENT: Lives with: lives with their partner Lives in: House/apartment Stairs: Yes: External: 5 steps; on right going up, on left going up, and can reach both Has following equipment at home: Single point cane, Walker - 2 wheeled, Wheelchair (manual), and shower chair  PLOF: Requires assistive device for independence and Needs assistance with homemaking  PATIENT GOALS: "fix my brace and if there's anything we can do for my shoulder"   OBJECTIVE:  Note: Objective measures were completed at Evaluation  unless otherwise noted.  DIAGNOSTIC FINDINGS:  MRI Cardiac 08/29/2023: IMPRESSION: 1. Mild to moderate decrease in left ventricular systolic function (LVEF =40%).   2. There is no late gadolinium enhancement in the left ventricular myocardium. Normal parametric mapping and tissue characterization.   3. Mild decrease in right ventricular  systolic function (RVEF =45%).  CT Head 08/26/2023: IMPRESSION: 1. No evidence of acute intracranial abnormality. 2. White matter lesions better characterized on prior MRI.  CT Angio Chest PE 08/26/2023: IMPRESSION: 1. No pulmonary embolism. 2. Moderate left anterior descending coronary artery calcification. 3. Extensive dependent pulmonary consolidation throughout the lungs which may relate to aspiration or multifocal infection.  *Updated imaging ordered by Dr. Godwin Lat (MRI of brain and C-spine)   COGNITION: Overall cognitive status: Within functional limits for tasks assessed   SENSATION: Light touch: WFL   MUSCLE TONE: Increased extensor tone of LLE >RLE but ROM and coordination limited bilaterally due to extensor tone/spasticity    POSTURE: rounded shoulders, forward head, and weight shift right  LOWER EXTREMITY ROM:   Limited by extensor tone/spasticity   Active  Right Eval Left Eval  Hip flexion    Hip extension    Hip abduction    Hip adduction    Hip internal rotation    Hip external rotation    Knee flexion    Knee extension    Ankle dorsiflexion    Ankle plantarflexion    Ankle inversion    Ankle eversion     (Blank rows = not tested)  LOWER EXTREMITY MMT:  Tested in seated position   MMT Right Eval Left Eval  Hip flexion 1 1  Hip extension    Hip abduction 4- 4-  Hip adduction    Hip internal rotation    Hip external rotation    Knee flexion 2 3-  Knee extension 3+ 1  Ankle dorsiflexion AFO donned AFO donned  Ankle plantarflexion    Ankle inversion    Ankle eversion    (Blank rows = not tested)  BED  MOBILITY:  Independent per pt - still in a standard bed, needs increased time to roll L and R  TRANSFERS: Assistive device utilized: Environmental consultant - 2 wheeled  Sit to stand: SBA Stand to sit: SBA Heavy reliance on BUEs to essentially "throw" himself out of chair, wide BOS, better hip extension today when standing to 2WW, when he lets go he maintains anterior lean in attempt to prevent knees locking out, when knees lock out he loses his balance.   GAIT: Gait pattern: step to pattern, decreased step length- Left, decreased stride length, decreased hip/knee flexion- Right, decreased hip/knee flexion- Left, decreased ankle dorsiflexion- Right, decreased ankle dorsiflexion- Left, lateral hip instability, trunk flexed, narrow BOS, and poor foot clearance- Left Distance walked: Various clinic distances  Assistive device utilized: Walker - 2 wheeled Level of assistance: SBA Comments: Pt required 9 minutes to ambulate into treatment room (~30') , frequently stopping to rest as his "legs lock up". Noted excessive inversion of L ankle as well as scissoring of LLE. Pt relying heavily on BUEs to perform more of a swing-to pattern.   FUNCTIONAL TESTS:  TREATMENT:    TherAct Assessed patient to see if he could benefit from dry needling. He does have trigger points in his R UT, no detectable trigger points around his R scapula and no tenderness to palpation in any areas. He does have R shoulder pain with IR along the medial border of his scapula. Pt with very tight muscles around his R shoulder blade especially along medial border but again no trigger points and no pain or tenderness with palpation. Deferred dry needling this date due to additional cost and unsure if patient would benefit significantly from this treatment, can assess again next visit with this therapist.  Manual  Therapy Assessed PROM of R shoulder joint in supine: Flexion, gets pain in anterior joint at 90 degrees Abduction, gets pain in posterior joint at 90 degrees along rest of ROM, relieves with arm returned to neutral ER/IR - WFL and no pain  Grade 3 joint mobilizations to R shoulder: Posterior shoulder glides to increase flexion Inferior shoulder glides to increase abduction  Assessed PROM again, pain with flexion and abduction in posterior joint, no pain in anterior jopint  TherEx To address chest tightness and weakness of posterior shoulder and postural muscles: Standing doorway pec stretch 3 x 30 sec each B Seated scap squeezes x 10 reps Decreased ability to activate rhomboids as exercise progresses and with onset of muscle fatigue Added to HEP but patient can benefit from continued practice of this     PATIENT EDUCATION: Education details: TPDN (handout provided), added to HEP Person educated: Patient Education method: Explanation, Demonstration, Tactile cues, Verbal cues, and Handouts Education comprehension: verbalized understanding, returned demonstration, verbal cues required, tactile cues required, and needs further education  HOME EXERCISE PROGRAM:  Access Code: [email or web address] URL: [email or web address] Date: 10/28/2023 Prepared by: Marilou Showman  Exercises - Supine Thoracic Mobilization Towel Roll Vertical with Arm Stretch  - 1 x daily - 5 x weekly - 1 sets - 2 minutes hold - Supine Lower Trunk Rotation  - 1 x daily - 5 x weekly - 2 sets - 10 reps - Seated Gastroc Stretch with Strap  - 1 x daily - 7 x weekly - 2 sets - 60 second hold - Standing Gastroc Stretch at Counter  - 2 x daily - 7 x weekly - 1 sets - 3 reps - 30 sec hold - Heel Sits  - 1 x daily - 5 x weekly - 1 sets - 12 reps - Tall Kneeling Diagonal Lift with Medicine Ball  - 1 x daily - 5 x weekly - 1 sets - 5-10 reps - Marching Bridge  - 1 x daily - 5 x weekly - 1 sets - 6 reps - Clamshell  -  1 x daily - 5 x weekly - 1-2 sets - 10 reps - Long Sitting Hamstring Stretch  - 1 x daily - 5 x weekly - 1 sets - 2-3 reps - 30-45 seconds hold - Seated Knee Flexion  - 1 x daily - 5 x weekly - 1-2 sets - 4 reps - Seated Hip Abduction with Resistance  - 1 x daily - 5 x weekly - 2 sets - 10 reps - Seated Scapular Retraction  - 1 x daily - 7 x weekly - 3 sets - 10 reps - Single Arm Doorway Pec Stretch at 90 Degrees Abduction  - 1 x daily - 7 x weekly - 1 sets - 3-5 reps - 30 sec each hold  GOALS: Goals reviewed with patient? Yes  SHORT TERM GOALS: Target date: 11/08/2023  PT will review, condense and modify HEP to meet current functional level. Baseline: to be reviewed from previous POC Goal status: INITIAL   Patient will obtain cooling vest from [employer] to improve ability to continue activity in warmer months. Baseline: PT will fax request to [employer] if pt unable to obtain on his own. Goal status: INITIAL   [employer] rep and PT to further assess and adjust left AFO if able to improve seating into brace for improved utilization. Baseline: PT to contact [employer]. Goal status: INITIAL  LONG TERM GOALS: Target date: 12/06/2023   Patient will tolerate low to moderate level cardio focused exercise for >/=10 minutes w/ RPE of no more than 5/10 in order to demonstrate tolerance to steady state activity. Baseline: To be introduced. Goal status: INITIAL  Patient will verbalize strategies for energy conservation to improve compliance to safety recommendations. Baseline: To be reviewed. Goal status: INITIAL  Patient will report no right shoulder pain with light activity in order to improve functional use of UE. Baseline: 1-2/10 w/ repetition Goal status: INITIAL  4.  Patient will demonstrate unsupported standing x30 seconds without LOB in order to improve upright safety and functional participation in ADLs. Baseline:  BLE lockout w/ LOB  Goal status:  INITIAL  5.  Patient will  demonstrate reach to floor from standing at no more than supervision level to improve functional upright mobility and independence. Baseline:  Cannot reach to floor without BLE collapsing  Goal status:  INITIAL  ASSESSMENT:  CLINICAL IMPRESSION: Emphasis of skilled PT session on assessing R shoulder region to determine if patient would be appropriate for dry needling. Pt does have palpable trigger points in his R UT as well as tight muscles around his R scapula, however he has no tenderness with palpation. Pt could possibly benefit from TPDN in a future session but deferred treatment for now. Provided educational handouts regarding TPDN as well as additional cost as service is not covered by insurance. Pt noted to have onset of R anterior shoulder joint pain with PROM flexion and posterior joint pain with abduction, changes to just posterior joint pain with flexion and abduction following joint mobilizations. He also exhibits rounded and protracted shoulders and benefits from stretching of his anterior chest muscles and strengthening of his posterior shoulder and postural muscles. He also could benefit from a review of scap squeezes as he had difficulty activating his rhomboids. Continue POC.    OBJECTIVE IMPAIRMENTS: Abnormal gait, cardiopulmonary status limiting activity, decreased activity tolerance, decreased balance, decreased cognition, decreased coordination, decreased endurance, decreased knowledge of condition, decreased knowledge of use of DME, decreased mobility, difficulty walking, decreased ROM, decreased strength, decreased safety awareness, impaired perceived functional ability, increased muscle spasms, impaired sensation, impaired tone, improper body mechanics, and pain  ACTIVITY LIMITATIONS: carrying, lifting, bending, standing, squatting, stairs, transfers, reach over head, locomotion level, and caring for others  PARTICIPATION LIMITATIONS: cleaning, driving, shopping, community  activity, occupation, and yard work  PERSONAL FACTORS: Behavior pattern, Education, Fitness, Past/current experiences, and 1-2 comorbidities: MS and significant cardiac involvement are also affecting patient's functional outcome.   REHAB POTENTIAL: Fair see personal factors and PMH  CLINICAL DECISION MAKING: Unstable/unpredictable  EVALUATION COMPLEXITY: High  PLAN:  PT FREQUENCY: 1-2x/week (2x/wk for initial 4 wks and 1x/wk for last 4 wks)  PT DURATION: 8 weeks  PLANNED INTERVENTIONS: 97164- PT Re-evaluation, 97750- Physical Performance Testing, 97110-Therapeutic exercises, 97530- Therapeutic activity, [id number]- Neuromuscular re-education, 97535- Self Care, 11914-  Manual therapy, [id number]- Gait training, 16109- Orthotic Initial, [id number]- Orthotic/Prosthetic subsequent, [phone]- Aquatic Therapy, [phone]- Electrical stimulation (manual), Patient/Family education, Balance training, Stair training, Taping, Dry Needling, Joint mobilization, DME instructions, Wheelchair mobility training, Cryotherapy, and Moist heat  PLAN FOR NEXT SESSION:  Reaching to floor level.  Standing balance, AFO fit - can someone from [employer] come assess left AFO?  Marissa contacted Tawny Fate about this, waiting for appt coordination!  Cardiac focus - modified BORG response and circuit style training.  Rotator cuff rehab - possible TPDN to right shoulder/upper traps. Review scap squeezes with him and upgrade to resistance band if able  Lorita Rosa, PT Lorita Rosa, PT, DPT, CSRS  10/31/2023, 10:13 AM

## 2023-11-04 ENCOUNTER — Ambulatory Visit: Admitting: Physical Therapy

## 2023-11-04 DIAGNOSIS — M25511 Pain in right shoulder: Secondary | ICD-10-CM | POA: Diagnosis not present

## 2023-11-04 DIAGNOSIS — M6281 Muscle weakness (generalized): Secondary | ICD-10-CM

## 2023-11-04 DIAGNOSIS — R29898 Other symptoms and signs involving the musculoskeletal system: Secondary | ICD-10-CM | POA: Diagnosis not present

## 2023-11-04 DIAGNOSIS — R2689 Other abnormalities of gait and mobility: Secondary | ICD-10-CM | POA: Diagnosis not present

## 2023-11-04 DIAGNOSIS — R2681 Unsteadiness on feet: Secondary | ICD-10-CM

## 2023-11-04 DIAGNOSIS — R29818 Other symptoms and signs involving the nervous system: Secondary | ICD-10-CM | POA: Diagnosis not present

## 2023-11-04 DIAGNOSIS — M21372 Foot drop, left foot: Secondary | ICD-10-CM | POA: Diagnosis not present

## 2023-11-04 DIAGNOSIS — R262 Difficulty in walking, not elsewhere classified: Secondary | ICD-10-CM | POA: Diagnosis not present

## 2023-11-04 DIAGNOSIS — M21371 Foot drop, right foot: Secondary | ICD-10-CM | POA: Diagnosis not present

## 2023-11-04 NOTE — Therapy (Unsigned)
 OUTPATIENT PHYSICAL THERAPY NEURO TREATMENT   Patient Name: Johnathan Ross MRN: [id number] DOB:07-16-1970, 53 y.o., male Today's Date: 11/04/2023   PCP: Arcadio Knuckles, MD  REFERRING PROVIDER: Sterling Eisenmenger, PA-C  END OF SESSION:     Past Medical History:  Diagnosis Date   Hyperlipidemia    Hypertension    Hypogonadism in male 11/01/2016   Hypothyroidism    Multiple sclerosis (HCC)    Neuromuscular disorder (HCC)    Proliferative diabetic retinopathy(362.02)    Type 1 diabetes mellitus (HCC) dx'd 1994   Vitamin D  insufficiency 11/29/2016   Past Surgical History:  Procedure Laterality Date   EYE SURGERY Bilateral    "laser for diabetic retinopathy"   FRACTURE SURGERY     ICD IMPLANT N/A 08/30/2023   Procedure: ICD IMPLANT;  Surgeon: Lei Pump, MD;  Location: [facility];  Service: Cardiovascular;  Laterality: N/A;   IR VENO/EXT/UNI LEFT  03/24/2020   OPEN REDUCTION INTERNAL FIXATION (ORIF) TIBIA/FIBULA FRACTURE Right 10/30/2016   ORIF ANKLE FRACTURE Left 2015   ORIF TIBIA FRACTURE Right 10/30/2016   Procedure: OPEN REDUCTION INTERNAL FIXATION (ORIF) TIBIA FIBULA FRACTURE;  Surgeon: Hardy Lia, MD;  Location: [facility];  Service: Orthopedics;  Laterality: Right;   RETINAL LASER PROCEDURE Bilateral    "for diabetic retinopathy"   RIGHT/LEFT HEART CATH AND CORONARY ANGIOGRAPHY N/A 08/26/2023   Procedure: RIGHT/LEFT HEART CATH AND CORONARY ANGIOGRAPHY;  Surgeon: Alwin Baars, DO;  Location: [facility];  Service: Cardiovascular;  Laterality: N/A;   Patient Active Problem List   Diagnosis Date Noted   Attention and concentration deficit 09/10/2023   Acute encephalopathy 09/03/2023   Cardiac arrest (HCC) 08/26/2023   Encounter for screening for HIV 06/17/2023   Need for immunization against influenza 06/17/2023   Loud snoring 06/17/2023   Encounter for general adult medical examination with abnormal findings 08/02/2022   Screening for  prostate cancer 08/01/2021   OAB (overactive bladder) 09/21/2020   Essential hypertension 09/21/2020   Thoracic outlet syndrome 05/03/2020   Controlled type 1 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 (HCC) 03/24/2020   Right epiretinal membrane 03/24/2020   Retinal exudates and deposits 03/24/2020   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   Nuclear sclerotic cataract of both eyes 03/24/2020   Gait disorder 03/23/2020   Benign prostatic hyperplasia without lower urinary tract symptoms 03/01/2020   Diabetic gastroparesis associated with type 1 diabetes mellitus (HCC) 03/01/2020   Gait disturbance 08/19/2019   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/19/2019   Vitamin D  insufficiency 11/29/2016   Hypogonadism in male 11/01/2016   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HCC)    Hypothyroidism    Hypertension    Dyslipidemia, goal LDL below 130    MULTIPLE SCLEROSIS 08/29/2007   PROLIFERATIVE DIABETIC RETINOPATHY 08/29/2007    ONSET DATE: 08/26/2023  REFERRING DIAG: G93.40 (ICD-10-CM) - Acute encephalopathy  THERAPY DIAG:  No diagnosis found.  Rationale for Evaluation and Treatment: Rehabilitation  SUBJECTIVE:  SUBJECTIVE STATEMENT: Pt presents to clinic in clinic wheelchair, forgot his RW today.  Denies any acute changes since last visit, ongoing R shoulder pain when he reaches behind his back, otherwise does not have pain.  Pt accompanied by: self and Mom dropped him off  PERTINENT HISTORY: vitamin D  insufficiency, Type 1 IDDM w/ polyneuropathy, diabetic retinopathy, hypothyroidism, HTN, hypercholestrolemia, h/o Rt tibia fracture, h/o Lt ankle fracture with ORIF, MS with acquired diplegia (MS diagnosis 2014/2015)  In the week prior to his cardiac arrest he  reported having some near syncopal episodes.  On 3/31 he went into witnessed cardiac arrest w/ bystander performing CPR for 20-25 minutes.  Per EMS notes he had persistent V-fib requiring 8 total defibrillations, 2 doses of epi and some ROSC w/ V tach.  He was also given Narcan in the field due to small pupils.  PAIN:  Are you having pain? No  PRECAUTIONS: Fall and ICD/Pacemaker (Can raise R shoulder as of May 9th - 6wks post placement of ICD; monitor BP - if systolic drops below 100 he is to follow-up with Cardiologist about medication management)  RED FLAGS: None   WEIGHT BEARING RESTRICTIONS: No  FALLS: Has patient fallen in last 6 months? No  LIVING ENVIRONMENT: Lives with: lives with their partner Lives in: House/apartment Stairs: Yes: External: 5 steps; on right going up, on left going up, and can reach both Has following equipment at home: Single point cane, Walker - 2 wheeled, Wheelchair (manual), and shower chair  PLOF: Requires assistive device for independence and Needs assistance with homemaking  PATIENT GOALS: "fix my brace and if there's anything we can do for my shoulder"   OBJECTIVE:  Note: Objective measures were completed at Evaluation unless otherwise noted.  DIAGNOSTIC FINDINGS:  MRI Cardiac 08/29/2023: IMPRESSION: 1. Mild to moderate decrease in left ventricular systolic function (LVEF =40%).   2. There is no late gadolinium enhancement in the left ventricular myocardium. Normal parametric mapping and tissue characterization.   3. Mild decrease in right ventricular systolic function (RVEF =45%).  CT Head 08/26/2023: IMPRESSION: 1. No evidence of acute intracranial abnormality. 2. White matter lesions better characterized on prior MRI.  CT Angio Chest PE 08/26/2023: IMPRESSION: 1. No pulmonary embolism. 2. Moderate left anterior descending coronary artery calcification. 3. Extensive dependent pulmonary consolidation throughout the lungs which may  relate to aspiration or multifocal infection.  *Updated imaging ordered by Dr. Godwin Lat (MRI of brain and C-spine)   COGNITION: Overall cognitive status: Within functional limits for tasks assessed   SENSATION: Light touch: WFL   MUSCLE TONE: Increased extensor tone of LLE >RLE but ROM and coordination limited bilaterally due to extensor tone/spasticity    POSTURE: rounded shoulders, forward head, and weight shift right  LOWER EXTREMITY ROM:   Limited by extensor tone/spasticity   Active  Right Eval Left Eval  Hip flexion    Hip extension    Hip abduction    Hip adduction    Hip internal rotation    Hip external rotation    Knee flexion    Knee extension    Ankle dorsiflexion    Ankle plantarflexion    Ankle inversion    Ankle eversion     (Blank rows = not tested)  LOWER EXTREMITY MMT:  Tested in seated position   MMT Right Eval Left Eval  Hip flexion 1 1  Hip extension    Hip abduction 4- 4-  Hip adduction    Hip internal rotation  Hip external rotation    Knee flexion 2 3-  Knee extension 3+ 1  Ankle dorsiflexion AFO donned AFO donned  Ankle plantarflexion    Ankle inversion    Ankle eversion    (Blank rows = not tested)  BED MOBILITY:  Independent per pt - still in a standard bed, needs increased time to roll L and R  TRANSFERS: Assistive device utilized: Environmental consultant - 2 wheeled  Sit to stand: SBA Stand to sit: SBA Heavy reliance on BUEs to essentially "throw" himself out of chair, wide BOS, better hip extension today when standing to 2WW, when he lets go he maintains anterior lean in attempt to prevent knees locking out, when knees lock out he loses his balance.   GAIT: Gait pattern: step to pattern, decreased step length- Left, decreased stride length, decreased hip/knee flexion- Right, decreased hip/knee flexion- Left, decreased ankle dorsiflexion- Right, decreased ankle dorsiflexion- Left, lateral hip instability, trunk flexed, narrow BOS, and poor  foot clearance- Left Distance walked: Various clinic distances  Assistive device utilized: Walker - 2 wheeled Level of assistance: SBA Comments: Pt required 9 minutes to ambulate into treatment room (~30') , frequently stopping to rest as his "legs lock up". Noted excessive inversion of L ankle as well as scissoring of LLE. Pt relying heavily on BUEs to perform more of a swing-to pattern.   FUNCTIONAL TESTS:                                                                                                                                 TREATMENT:    TherAct Assessed patient to see if he could benefit from dry needling. He does have trigger points in his R UT, no detectable trigger points around his R scapula and no tenderness to palpation in any areas. He does have R shoulder pain with IR along the medial border of his scapula. Pt with very tight muscles around his R shoulder blade especially along medial border but again no trigger points and no pain or tenderness with palpation. Deferred dry needling this date due to additional cost and unsure if patient would benefit significantly from this treatment, can assess again next visit with this therapist.  Manual Therapy Assessed PROM of R shoulder joint in supine: Flexion, gets pain in anterior joint at 90 degrees Abduction, gets pain in posterior joint at 90 degrees along rest of ROM, relieves with arm returned to neutral ER/IR - WFL and no pain  Grade 3 joint mobilizations to R shoulder: Posterior shoulder glides to increase flexion Inferior shoulder glides to increase abduction  Assessed PROM again, pain with flexion and abduction in posterior joint, no pain in anterior jopint  TherEx To address chest tightness and weakness of posterior shoulder and postural muscles: Standing doorway pec stretch 3 x 30 sec each B Seated scap squeezes x 10 reps Decreased ability to activate rhomboids as exercise progresses and with onset of muscle  fatigue  Added to HEP but patient can benefit from continued practice of this  ---wand for shoulder internal rotation, extension, external rotation,    PATIENT EDUCATION: Education details: TPDN (handout provided), added to HEP Person educated: Patient Education method: Explanation, Demonstration, Tactile cues, Verbal cues, and Handouts Education comprehension: verbalized understanding, returned demonstration, verbal cues required, tactile cues required, and needs further education  HOME EXERCISE PROGRAM:  Access Code: [email or web address] URL: [email or web address] Date: 10/28/2023 Prepared by: Marilou Showman  Exercises - Supine Thoracic Mobilization Towel Roll Vertical with Arm Stretch  - 1 x daily - 5 x weekly - 1 sets - 2 minutes hold - Supine Lower Trunk Rotation  - 1 x daily - 5 x weekly - 2 sets - 10 reps - Seated Gastroc Stretch with Strap  - 1 x daily - 7 x weekly - 2 sets - 60 second hold - Standing Gastroc Stretch at Counter  - 2 x daily - 7 x weekly - 1 sets - 3 reps - 30 sec hold - Heel Sits  - 1 x daily - 5 x weekly - 1 sets - 12 reps - Tall Kneeling Diagonal Lift with Medicine Ball  - 1 x daily - 5 x weekly - 1 sets - 5-10 reps - Marching Bridge  - 1 x daily - 5 x weekly - 1 sets - 6 reps - Clamshell  - 1 x daily - 5 x weekly - 1-2 sets - 10 reps - Long Sitting Hamstring Stretch  - 1 x daily - 5 x weekly - 1 sets - 2-3 reps - 30-45 seconds hold - Seated Knee Flexion  - 1 x daily - 5 x weekly - 1-2 sets - 4 reps - Seated Hip Abduction with Resistance  - 1 x daily - 5 x weekly - 2 sets - 10 reps - Seated Scapular Retraction  - 1 x daily - 7 x weekly - 3 sets - 10 reps - Single Arm Doorway Pec Stretch at 90 Degrees Abduction  - 1 x daily - 7 x weekly - 1 sets - 3-5 reps - 30 sec each hold  GOALS: Goals reviewed with patient? Yes  SHORT TERM GOALS: Target date: 11/08/2023  PT will review, condense and modify HEP to meet current functional level. Baseline: to be  reviewed from previous POC Goal status: INITIAL   Patient will obtain cooling vest from [employer] to improve ability to continue activity in warmer months. Baseline: PT will fax request to [employer] if pt unable to obtain on his own. Goal status: INITIAL   [employer] rep and PT to further assess and adjust left AFO if able to improve seating into brace for improved utilization. Baseline: PT to contact [employer]. Goal status: INITIAL  LONG TERM GOALS: Target date: 12/06/2023   Patient will tolerate low to moderate level cardio focused exercise for >/=10 minutes w/ RPE of no more than 5/10 in order to demonstrate tolerance to steady state activity. Baseline: To be introduced. Goal status: INITIAL  Patient will verbalize strategies for energy conservation to improve compliance to safety recommendations. Baseline: To be reviewed. Goal status: INITIAL  Patient will report no right shoulder pain with light activity in order to improve functional use of UE. Baseline: 1-2/10 w/ repetition Goal status: INITIAL  4.  Patient will demonstrate unsupported standing x30 seconds without LOB in order to improve upright safety and functional participation in ADLs. Baseline:  BLE lockout w/ LOB  Goal status:  INITIAL  5.  Patient  will demonstrate reach to floor from standing at no more than supervision level to improve functional upright mobility and independence. Baseline:  Cannot reach to floor without BLE collapsing  Goal status:  INITIAL  ASSESSMENT:  CLINICAL IMPRESSION: Emphasis of skilled PT session on assessing R shoulder region to determine if patient would be appropriate for dry needling. Pt does have palpable trigger points in his R UT as well as tight muscles around his R scapula, however he has no tenderness with palpation. Pt could possibly benefit from TPDN in a future session but deferred treatment for now. Provided educational handouts regarding TPDN as well as additional cost as service  is not covered by insurance. Pt noted to have onset of R anterior shoulder joint pain with PROM flexion and posterior joint pain with abduction, changes to just posterior joint pain with flexion and abduction following joint mobilizations. He also exhibits rounded and protracted shoulders and benefits from stretching of his anterior chest muscles and strengthening of his posterior shoulder and postural muscles. He also could benefit from a review of scap squeezes as he had difficulty activating his rhomboids. Continue POC.    OBJECTIVE IMPAIRMENTS: Abnormal gait, cardiopulmonary status limiting activity, decreased activity tolerance, decreased balance, decreased cognition, decreased coordination, decreased endurance, decreased knowledge of condition, decreased knowledge of use of DME, decreased mobility, difficulty walking, decreased ROM, decreased strength, decreased safety awareness, impaired perceived functional ability, increased muscle spasms, impaired sensation, impaired tone, improper body mechanics, and pain  ACTIVITY LIMITATIONS: carrying, lifting, bending, standing, squatting, stairs, transfers, reach over head, locomotion level, and caring for others  PARTICIPATION LIMITATIONS: cleaning, driving, shopping, community activity, occupation, and yard work  PERSONAL FACTORS: Behavior pattern, Education, Fitness, Past/current experiences, and 1-2 comorbidities: MS and significant cardiac involvement are also affecting patient's functional outcome.   REHAB POTENTIAL: Fair see personal factors and PMH  CLINICAL DECISION MAKING: Unstable/unpredictable  EVALUATION COMPLEXITY: High  PLAN:  PT FREQUENCY: 1-2x/week (2x/wk for initial 4 wks and 1x/wk for last 4 wks)  PT DURATION: 8 weeks  PLANNED INTERVENTIONS: [id number]- PT Re-evaluation, 97750- Physical Performance Testing, 97110-Therapeutic exercises, 97530- Therapeutic activity, [id number]- Neuromuscular re-education, 97535- Self Care, [id number]- Manual  therapy, [id number]- Gait training, [phone]- Orthotic Initial, [id number]- Orthotic/Prosthetic subsequent, [id number]- Aquatic Therapy, [phone]- Electrical stimulation (manual), Patient/Family education, Balance training, Stair training, Taping, Dry Needling, Joint mobilization, DME instructions, Wheelchair mobility training, Cryotherapy, and Moist heat  PLAN FOR NEXT SESSION:  Reaching to floor level.  Standing balance, AFO fit - can someone from [employer] come assess left AFO?  Marissa contacted Tawny Fate about this, waiting for appt coordination!  Cardiac focus - modified BORG response and circuit style training.  Rotator cuff rehab - possible TPDN to right shoulder/upper traps. Review scap squeezes with him and upgrade to resistance band if able  Tricia Pledger, Celeste Cola, PT  11/04/2023, 9:39 AM

## 2023-11-06 ENCOUNTER — Encounter: Payer: Self-pay | Admitting: Physical Therapy

## 2023-11-07 ENCOUNTER — Ambulatory Visit: Admitting: Physical Therapy

## 2023-11-07 VITALS — HR 68

## 2023-11-07 DIAGNOSIS — R2689 Other abnormalities of gait and mobility: Secondary | ICD-10-CM | POA: Diagnosis not present

## 2023-11-07 DIAGNOSIS — R29898 Other symptoms and signs involving the musculoskeletal system: Secondary | ICD-10-CM | POA: Diagnosis not present

## 2023-11-07 DIAGNOSIS — M21371 Foot drop, right foot: Secondary | ICD-10-CM | POA: Diagnosis not present

## 2023-11-07 DIAGNOSIS — M6281 Muscle weakness (generalized): Secondary | ICD-10-CM | POA: Diagnosis not present

## 2023-11-07 DIAGNOSIS — E1065 Type 1 diabetes mellitus with hyperglycemia: Secondary | ICD-10-CM | POA: Diagnosis not present

## 2023-11-07 DIAGNOSIS — R29818 Other symptoms and signs involving the nervous system: Secondary | ICD-10-CM | POA: Diagnosis not present

## 2023-11-07 DIAGNOSIS — R2681 Unsteadiness on feet: Secondary | ICD-10-CM | POA: Diagnosis not present

## 2023-11-07 DIAGNOSIS — M25511 Pain in right shoulder: Secondary | ICD-10-CM | POA: Diagnosis not present

## 2023-11-07 DIAGNOSIS — R262 Difficulty in walking, not elsewhere classified: Secondary | ICD-10-CM | POA: Diagnosis not present

## 2023-11-07 DIAGNOSIS — M21372 Foot drop, left foot: Secondary | ICD-10-CM | POA: Diagnosis not present

## 2023-11-08 ENCOUNTER — Encounter: Payer: Self-pay | Admitting: Family Medicine

## 2023-11-08 ENCOUNTER — Telehealth: Payer: Self-pay | Admitting: Internal Medicine

## 2023-11-08 ENCOUNTER — Encounter: Payer: Self-pay | Admitting: Physical Therapy

## 2023-11-08 NOTE — Therapy (Signed)
 OUTPATIENT PHYSICAL THERAPY NEURO TREATMENT   Patient Name: Johnathan Ross MRN: [id number] DOB:1971-03-11, 53 y.o., male Today's Date: 11/08/2023   PCP: Arcadio Knuckles, MD  REFERRING PROVIDER: Sterling Eisenmenger, PA-C  END OF SESSION:  PT End of Session - 11/08/23 1545     Visit Number 5    Number of Visits 13   12 + eval   Date for PT Re-Evaluation 12/20/23   pushed out due to scheduling delay   Authorization Type [employer]    Authorization Time Period 10-08-23 - 12-20-23    Authorization - Visit Number 5    Authorization - Number of Visits 12    PT Start Time 0933    PT Stop Time 1015    PT Time Calculation (min) 42 min    Equipment Utilized During Treatment --    Activity Tolerance Patient tolerated treatment well    Behavior During Therapy [facility] for tasks assessed/performed            Past Medical History:  Diagnosis Date   Hyperlipidemia    Hypertension    Hypogonadism in male 11/01/2016   Hypothyroidism    Multiple sclerosis (HCC)    Neuromuscular disorder (HCC)    Proliferative diabetic retinopathy(362.02)    Type 1 diabetes mellitus (HCC) dx'd 1994   Vitamin D  insufficiency 11/29/2016   Past Surgical History:  Procedure Laterality Date   EYE SURGERY Bilateral    laser for diabetic retinopathy   FRACTURE SURGERY     ICD IMPLANT N/A 08/30/2023   Procedure: ICD IMPLANT;  Surgeon: Lei Pump, MD;  Location: [facility];  Service: Cardiovascular;  Laterality: N/A;   IR VENO/EXT/UNI LEFT  03/24/2020   OPEN REDUCTION INTERNAL FIXATION (ORIF) TIBIA/FIBULA FRACTURE Right 10/30/2016   ORIF ANKLE FRACTURE Left 2015   ORIF TIBIA FRACTURE Right 10/30/2016   Procedure: OPEN REDUCTION INTERNAL FIXATION (ORIF) TIBIA FIBULA FRACTURE;  Surgeon: Hardy Lia, MD;  Location: [facility] OR;  Service: Orthopedics;  Laterality: Right;   RETINAL LASER PROCEDURE Bilateral    for diabetic retinopathy   RIGHT/LEFT HEART CATH AND CORONARY ANGIOGRAPHY N/A  08/26/2023   Procedure: RIGHT/LEFT HEART CATH AND CORONARY ANGIOGRAPHY;  Surgeon: Alwin Baars, DO;  Location: [facility];  Service: Cardiovascular;  Laterality: N/A;   Patient Active Problem List   Diagnosis Date Noted   Attention and concentration deficit 09/10/2023   Acute encephalopathy 09/03/2023   Cardiac arrest (HCC) 08/26/2023   Encounter for screening for HIV 06/17/2023   Need for immunization against influenza 06/17/2023   Loud snoring 06/17/2023   Encounter for general adult medical examination with abnormal findings 08/02/2022   Screening for prostate cancer 08/01/2021   OAB (overactive bladder) 09/21/2020   Essential hypertension 09/21/2020   Thoracic outlet syndrome 05/03/2020   Controlled type 1 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 (HCC) 03/24/2020   Right epiretinal membrane 03/24/2020   Retinal exudates and deposits 03/24/2020   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   Nuclear sclerotic cataract of both eyes 03/24/2020   Gait disorder 03/23/2020   Benign prostatic hyperplasia without lower urinary tract symptoms 03/01/2020   Diabetic gastroparesis associated with type 1 diabetes mellitus (HCC) 03/01/2020   Gait disturbance 08/19/2019   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/19/2019   Vitamin D  insufficiency 11/29/2016   Hypogonadism in male 11/01/2016   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HCC)    Hypothyroidism    Hypertension    Dyslipidemia, goal LDL below  130    MULTIPLE SCLEROSIS 08/29/2007   PROLIFERATIVE DIABETIC RETINOPATHY 08/29/2007    ONSET DATE: 08/26/2023  REFERRING DIAG: G93.40 (ICD-10-CM) - Acute encephalopathy  THERAPY DIAG:  Other abnormalities of gait and mobility  Muscle weakness (generalized)  Unsteadiness on feet  Rationale for Evaluation and Treatment: Rehabilitation  SUBJECTIVE:                                                                                                                                                                                              SUBJECTIVE STATEMENT: Pt amb. To PT today with use of RW - using reciprocal gait pattern with standing rest breaks prn; pt reports the shoulder exercises given for HEP are going well at home.  Pt reports he doesn't have shoulder pain unless he reaches behind his back (internally rotates) to do something with his insulin  pump which is located on the left side of his low back  Denies any acute changes since last visit, ongoing R shoulder pain when he reaches behind his back, otherwise does not have pain.  Pt accompanied by: self and Mom dropped him off  PERTINENT HISTORY: vitamin D  insufficiency, Type 1 IDDM w/ polyneuropathy, diabetic retinopathy, hypothyroidism, HTN, hypercholestrolemia, h/o Rt tibia fracture, h/o Lt ankle fracture with ORIF, MS with acquired diplegia (MS diagnosis 2014/2015)  In the week prior to his cardiac arrest he reported having some near syncopal episodes.  On 3/31 he went into witnessed cardiac arrest w/ bystander performing CPR for 20-25 minutes.  Per EMS notes he had persistent V-fib requiring 8 total defibrillations, 2 doses of epi and some ROSC w/ V tach.  He was also given Narcan in the field due to small pupils.  PAIN:  Are you having pain? No  PRECAUTIONS: Fall and ICD/Pacemaker (Can raise R shoulder as of May 9th - 6wks post placement of ICD; monitor BP - if systolic drops below 100 he is to follow-up with Cardiologist about medication management)  RED FLAGS: None   WEIGHT BEARING RESTRICTIONS: No  FALLS: Has patient fallen in last 6 months? No  LIVING ENVIRONMENT: Lives with: lives with their partner Lives in: House/apartment Stairs: Yes: External: 5 steps; on right going up, on left going up, and can reach both Has following equipment at home: Single point cane, Walker - 2 wheeled, Wheelchair (manual), and shower chair  PLOF: Requires assistive device for  independence and Needs assistance with homemaking  PATIENT GOALS: fix my brace and if there's anything we can do for my shoulder   OBJECTIVE:  Note: Objective measures were completed at Evaluation unless otherwise noted.  DIAGNOSTIC FINDINGS:  MRI Cardiac 08/29/2023: IMPRESSION: 1. Mild to moderate decrease in left ventricular systolic function (LVEF =40%).   2. There is no late gadolinium enhancement in the left ventricular myocardium. Normal parametric mapping and tissue characterization.   3. Mild decrease in right ventricular systolic function (RVEF =45%).  CT Head 08/26/2023: IMPRESSION: 1. No evidence of acute intracranial abnormality. 2. White matter lesions better characterized on prior MRI.  CT Angio Chest PE 08/26/2023: IMPRESSION: 1. No pulmonary embolism. 2. Moderate left anterior descending coronary artery calcification. 3. Extensive dependent pulmonary consolidation throughout the lungs which may relate to aspiration or multifocal infection.  *Updated imaging ordered by Dr. Godwin Lat (MRI of brain and C-spine)   COGNITION: Overall cognitive status: Within functional limits for tasks assessed   SENSATION: Light touch: WFL   MUSCLE TONE: Increased extensor tone of LLE >RLE but ROM and coordination limited bilaterally due to extensor tone/spasticity    POSTURE: rounded shoulders, forward head, and weight shift right  LOWER EXTREMITY ROM:   Limited by extensor tone/spasticity   Active  Right Eval Left Eval  Hip flexion    Hip extension    Hip abduction    Hip adduction    Hip internal rotation    Hip external rotation    Knee flexion    Knee extension    Ankle dorsiflexion    Ankle plantarflexion    Ankle inversion    Ankle eversion     (Blank rows = not tested)  LOWER EXTREMITY MMT:  Tested in seated position   MMT Right Eval Left Eval  Hip flexion 1 1  Hip extension    Hip abduction 4- 4-  Hip adduction    Hip internal rotation    Hip  external rotation    Knee flexion 2 3-  Knee extension 3+ 1  Ankle dorsiflexion AFO donned AFO donned  Ankle plantarflexion    Ankle inversion    Ankle eversion    (Blank rows = not tested)  BED MOBILITY:  Independent per pt - still in a standard bed, needs increased time to roll L and R  TRANSFERS: Assistive device utilized: Environmental consultant - 2 wheeled  Sit to stand: SBA Stand to sit: SBA Heavy reliance on BUEs to essentially throw himself out of chair, wide BOS, better hip extension today when standing to 2WW, when he lets go he maintains anterior lean in attempt to prevent knees locking out, when knees lock out he loses his balance.   GAIT: Gait pattern: step to pattern, decreased step length- Left, decreased stride length, decreased hip/knee flexion- Right, decreased hip/knee flexion- Left, decreased ankle dorsiflexion- Right, decreased ankle dorsiflexion- Left, lateral hip instability, trunk flexed, narrow BOS, and poor foot clearance- Left Distance walked: Various clinic distances  Assistive device utilized: Walker - 2 wheeled Level of assistance: SBA Comments: Pt required 9 minutes to ambulate into treatment room (~30') , frequently stopping to rest as his legs lock up. Noted excessive inversion of L ankle as well as scissoring of LLE. Pt relying heavily on BUEs to perform more of a swing-to pattern.   FUNCTIONAL TESTS:  TREATMENT: 11-04-23   Gait:  pt amb. From clinic lobby to UBE machine with use of RW using a reciprocal gait pattern with standing rest breaks prn Resting heart rate 68 bpm;  02 saturation 96%  Pt amb. Approx. 39' from UBE to mat table on other side of gym using RW with reciprocal gait pattern - HR 70 bpm  TherAct:   Pt performed UBE exercise - forward movement - 3 at level 3:  HR 68, 02 sat. Rate 97% after 3 of this exercise Pt then  performed backward revolutions with Ue's on UBE for scapular retraction and for cardiovascular conditioning - 5 backward - HR 75 bpm, 02 rate 96%   Used 10# kettlebell - in seated position - pt lifted kettlebell from floor to chest height 10 reps; then lifted in diagonal patterns X - 10 reps each direction:  HR 81 bpm Pt held 10# kettlebell in front, close to his body and performed trunk rotation holding kettlebell 10 reps x 2 sets - rest period between sets; HR 80 bpm  Tall kneeling position with kay bench in front - pt lifted 5# kettle bell to approx. 100 degrees flexion - CGA for stability for balance 10 hip hinges in tall kneeling  Pt lifted bil. Ue's together in shoulder flexion 10 reps - no reps - with CGA for balance  Pt moved RLE forward/back and then out/in with min assist for lifting leg for forward/back motion 10 reps; performed same exercise with LLE in tall kneeling position HR 79 bpm, 02 rate 97% at end of session  Pt reported fatigue at end of session  HEP updated:  for shoulder strengthening and stretching Access Code: [email or web address] URL: [email or web address] Date: 11/06/2023 Prepared by: Johnnette Nakayama  Exercises - Standing Bilateral Shoulder Internal Rotation AAROM with Dowel  - 1 x daily - 7 x weekly - 3 sets - 10 reps - Standing Shoulder Extension with Dowel  - 1 x daily - 7 x weekly - 3 sets - 10 reps - Standing Overhead Shoulder External Rotation Stretch with Towel  - 1 x daily - 7 x weekly - 3 sets - 10 reps - Corner Pec Major Stretch  - 1 x daily - 7 x weekly - 1 sets - 2-3 reps - 15-20 hold - Doorway Pec Stretch at 120 Degrees Abduction  - 1 x daily - 7 x weekly - 1 sets - 2-3 reps - 15 hold   TherAct Tall kneeling on mat - pt performed 10 hip hinges with CGA; lifting bil. Ue's to 90 degrees for core stabilization 5 reps - pt c/o fatigue in low back musc. As this exercise was performed near end of session  Sit to stand from mat - pt required bil. UE  support  Stood without knees hyperextended by mat table, but not touching mat - lifted 5# kettlebell up to chest height 10 reps with CGA for stability    PATIENT EDUCATION: Education details: TPDN (handout provided), added to HEP Person educated: Patient Education method: Explanation, Demonstration, Tactile cues, Verbal cues, and Handouts Education comprehension: verbalized understanding, returned demonstration, verbal cues required, tactile cues required, and needs further education  HOME EXERCISE PROGRAM:  Access Code: [email or web address] URL: [email or web address] Date: 10/28/2023 Prepared by: Marilou Showman  Exercises - Supine Thoracic Mobilization Towel Roll Vertical with Arm Stretch  - 1 x daily - 5 x weekly - 1 sets - 2 minutes hold - Supine Lower Trunk Rotation  - 1 x daily - 5 x weekly -  2 sets - 10 reps - Seated Gastroc Stretch with Strap  - 1 x daily - 7 x weekly - 2 sets - 60 second hold - Standing Gastroc Stretch at Counter  - 2 x daily - 7 x weekly - 1 sets - 3 reps - 30 sec hold - Heel Sits  - 1 x daily - 5 x weekly - 1 sets - 12 reps - Tall Kneeling Diagonal Lift with Medicine Ball  - 1 x daily - 5 x weekly - 1 sets - 5-10 reps - Marching Bridge  - 1 x daily - 5 x weekly - 1 sets - 6 reps - Clamshell  - 1 x daily - 5 x weekly - 1-2 sets - 10 reps - Long Sitting Hamstring Stretch  - 1 x daily - 5 x weekly - 1 sets - 2-3 reps - 30-45 seconds hold - Seated Knee Flexion  - 1 x daily - 5 x weekly - 1-2 sets - 4 reps - Seated Hip Abduction with Resistance  - 1 x daily - 5 x weekly - 2 sets - 10 reps - Seated Scapular Retraction  - 1 x daily - 7 x weekly - 3 sets - 10 reps - Single Arm Doorway Pec Stretch at 90 Degrees Abduction  - 1 x daily - 7 x weekly - 1 sets - 3-5 reps - 30 sec each hold  GOALS: Goals reviewed with patient? Yes  SHORT TERM GOALS: Target date: 11/08/2023  PT will review, condense and modify HEP to meet current functional level. Baseline: to be  reviewed from previous POC Goal status: Goal met 11-07-23   Patient will obtain cooling vest from [employer] to improve ability to continue activity in warmer months. Baseline: PT will fax request to [employer] if pt unable to obtain on his own. Goal status: INITIAL   [employer] rep and PT to further assess and adjust left AFO if able to improve seating into brace for improved utilization. Baseline: PT to contact [employer]. Goal status: INITIAL  LONG TERM GOALS: Target date: 12/06/2023   Patient will tolerate low to moderate level cardio focused exercise for >/=10 minutes w/ RPE of no more than 5/10 in order to demonstrate tolerance to steady state activity. Baseline: To be introduced. Goal status: INITIAL  Patient will verbalize strategies for energy conservation to improve compliance to safety recommendations. Baseline: To be reviewed. Goal status: INITIAL  Patient will report no right shoulder pain with light activity in order to improve functional use of UE. Baseline: 1-2/10 w/ repetition Goal status: INITIAL  4.  Patient will demonstrate unsupported standing x30 seconds without LOB in order to improve upright safety and functional participation in ADLs. Baseline:  BLE lockout w/ LOB  Goal status:  INITIAL  5.  Patient will demonstrate reach to floor from standing at no more than supervision level to improve functional upright mobility and independence. Baseline:  Cannot reach to floor without BLE collapsing  Goal status:  INITIAL  ASSESSMENT:  CLINICAL IMPRESSION: PT session focused on cardiovascular conditioning exercises and activities; pt's HR after amb. From lobby to clinic gym with RW 68 bpm.  HR increased to 81 bpm with use of 10# kettlebell in seated position.  Pt reported moderate fatigue at end of session - declined performing SciFit exercise due to c/o fatigue.  Pt tolerated exercises well during session. Continue POC.    OBJECTIVE IMPAIRMENTS: Abnormal gait,  cardiopulmonary status limiting activity, decreased activity tolerance, decreased balance, decreased  cognition, decreased coordination, decreased endurance, decreased knowledge of condition, decreased knowledge of use of DME, decreased mobility, difficulty walking, decreased ROM, decreased strength, decreased safety awareness, impaired perceived functional ability, increased muscle spasms, impaired sensation, impaired tone, improper body mechanics, and pain  ACTIVITY LIMITATIONS: carrying, lifting, bending, standing, squatting, stairs, transfers, reach over head, locomotion level, and caring for others  PARTICIPATION LIMITATIONS: cleaning, driving, shopping, community activity, occupation, and yard work  PERSONAL FACTORS: Behavior pattern, Education, Fitness, Past/current experiences, and 1-2 comorbidities: MS and significant cardiac involvement are also affecting patient's functional outcome.   REHAB POTENTIAL: Fair see personal factors and PMH  CLINICAL DECISION MAKING: Unstable/unpredictable  EVALUATION COMPLEXITY: High  PLAN:  PT FREQUENCY: 1-2x/week (2x/wk for initial 4 wks and 1x/wk for last 4 wks)  PT DURATION: 8 weeks  PLANNED INTERVENTIONS: 97164- PT Re-evaluation, 97750- Physical Performance Testing, 97110-Therapeutic exercises, 97530- Therapeutic activity, [id number]- Neuromuscular re-education, 97535- Self Care, 14782- Manual therapy, [id number]- Gait training, [phone]- Orthotic Initial, [phone]- Orthotic/Prosthetic subsequent, [phone]- Aquatic Therapy, [phone]- Electrical stimulation (manual), Patient/Family education, Balance training, Stair training, Taping, Dry Needling, Joint mobilization, DME instructions, Wheelchair mobility training, Cryotherapy, and Moist heat  PLAN FOR NEXT SESSION:  Please check STG's #2 and 3:  Cont cardio exercises and activities - add SciFit as pt too fatigued for this ex. On 11-07-23  Reaching to floor level.  Standing balance, AFO fit - can someone from [employer] come  assess left AFO?  Marissa contacted Tawny Fate about this, waiting for appt coordination!  Cardiac focus - modified BORG response and circuit style training.   Dorsie Gaunt, PT  11/08/2023, 3:48 PM

## 2023-11-08 NOTE — Telephone Encounter (Signed)
 Copied from CRM [id number]. Topic: Referral - Question >> Nov 08, 2023 10:30 AM Alyse July wrote: Reason for CRM: Patient would like a call back to discuss surgical referral that was suppose to be placed for him pertaining to a growth on his leg CB#[id number]

## 2023-11-08 NOTE — Telephone Encounter (Signed)
 I don't see a referral for a growth on his leg. Please advise.

## 2023-11-11 ENCOUNTER — Encounter: Payer: Self-pay | Admitting: Physical Therapy

## 2023-11-11 ENCOUNTER — Ambulatory Visit: Admitting: Physical Therapy

## 2023-11-11 DIAGNOSIS — R29898 Other symptoms and signs involving the musculoskeletal system: Secondary | ICD-10-CM | POA: Diagnosis not present

## 2023-11-11 DIAGNOSIS — R262 Difficulty in walking, not elsewhere classified: Secondary | ICD-10-CM

## 2023-11-11 DIAGNOSIS — M21371 Foot drop, right foot: Secondary | ICD-10-CM

## 2023-11-11 DIAGNOSIS — R2681 Unsteadiness on feet: Secondary | ICD-10-CM | POA: Diagnosis not present

## 2023-11-11 DIAGNOSIS — M21372 Foot drop, left foot: Secondary | ICD-10-CM

## 2023-11-11 DIAGNOSIS — M6281 Muscle weakness (generalized): Secondary | ICD-10-CM | POA: Diagnosis not present

## 2023-11-11 DIAGNOSIS — R29818 Other symptoms and signs involving the nervous system: Secondary | ICD-10-CM

## 2023-11-11 DIAGNOSIS — M25511 Pain in right shoulder: Secondary | ICD-10-CM | POA: Diagnosis not present

## 2023-11-11 DIAGNOSIS — R2689 Other abnormalities of gait and mobility: Secondary | ICD-10-CM | POA: Diagnosis not present

## 2023-11-11 NOTE — Therapy (Signed)
 OUTPATIENT PHYSICAL THERAPY NEURO TREATMENT   Patient Name: Johnathan Ross MRN: [id number] DOB:1970-08-08, 53 y.o., male Today's Date: 11/11/2023   PCP: Arcadio Knuckles, MD  REFERRING PROVIDER: Sterling Eisenmenger, PA-C  END OF SESSION:  PT End of Session - 11/11/23 0941     Visit Number 6    Number of Visits 13   12 + eval   Date for PT Re-Evaluation 12/20/23   pushed out due to scheduling delay   Authorization Type [employer]    Authorization Time Period 10-08-23 - 12-20-23    Authorization - Visit Number 6    Authorization - Number of Visits 12    PT Start Time [phone]    PT Stop Time 1017    PT Time Calculation (min) 41 min    Equipment Utilized During Treatment Gait belt    Activity Tolerance Patient tolerated treatment well    Behavior During Therapy [facility] for tasks assessed/performed            Past Medical History:  Diagnosis Date   Hyperlipidemia    Hypertension    Hypogonadism in male 11/01/2016   Hypothyroidism    Multiple sclerosis (HCC)    Neuromuscular disorder (HCC)    Proliferative diabetic retinopathy(362.02)    Type 1 diabetes mellitus (HCC) dx'd 1994   Vitamin D  insufficiency 11/29/2016   Past Surgical History:  Procedure Laterality Date   EYE SURGERY Bilateral    laser for diabetic retinopathy   FRACTURE SURGERY     ICD IMPLANT N/A 08/30/2023   Procedure: ICD IMPLANT;  Surgeon: Lei Pump, MD;  Location: [facility] INVASIVE [facility];  Service: Cardiovascular;  Laterality: N/A;   IR VENO/EXT/UNI LEFT  03/24/2020   OPEN REDUCTION INTERNAL FIXATION (ORIF) TIBIA/FIBULA FRACTURE Right 10/30/2016   ORIF ANKLE FRACTURE Left 2015   ORIF TIBIA FRACTURE Right 10/30/2016   Procedure: OPEN REDUCTION INTERNAL FIXATION (ORIF) TIBIA FIBULA FRACTURE;  Surgeon: Hardy Lia, MD;  Location: [facility] OR;  Service: Orthopedics;  Laterality: Right;   RETINAL LASER PROCEDURE Bilateral    for diabetic retinopathy   RIGHT/LEFT HEART CATH AND CORONARY ANGIOGRAPHY N/A  08/26/2023   Procedure: RIGHT/LEFT HEART CATH AND CORONARY ANGIOGRAPHY;  Surgeon: Alwin Baars, DO;  Location: [facility];  Service: Cardiovascular;  Laterality: N/A;   Patient Active Problem List   Diagnosis Date Noted   Attention and concentration deficit 09/10/2023   Acute encephalopathy 09/03/2023   Cardiac arrest (HCC) 08/26/2023   Encounter for screening for HIV 06/17/2023   Need for immunization against influenza 06/17/2023   Loud snoring 06/17/2023   Encounter for general adult medical examination with abnormal findings 08/02/2022   Screening for prostate cancer 08/01/2021   OAB (overactive bladder) 09/21/2020   Essential hypertension 09/21/2020   Thoracic outlet syndrome 05/03/2020   Controlled type 1 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 (HCC) 03/24/2020   Right epiretinal membrane 03/24/2020   Retinal exudates and deposits 03/24/2020   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   Nuclear sclerotic cataract of both eyes 03/24/2020   Gait disorder 03/23/2020   Benign prostatic hyperplasia without lower urinary tract symptoms 03/01/2020   Diabetic gastroparesis associated with type 1 diabetes mellitus (HCC) 03/01/2020   Gait disturbance 08/19/2019   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/19/2019   Vitamin D  insufficiency 11/29/2016   Hypogonadism in male 11/01/2016   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HCC)    Hypothyroidism    Hypertension    Dyslipidemia, goal LDL  below 130    MULTIPLE SCLEROSIS 08/29/2007   PROLIFERATIVE DIABETIC RETINOPATHY 08/29/2007    ONSET DATE: 08/26/2023  REFERRING DIAG: G93.40 (ICD-10-CM) - Acute encephalopathy  THERAPY DIAG:  Other abnormalities of gait and mobility  Muscle weakness (generalized)  Unsteadiness on feet  Other symptoms and signs involving the nervous system  Other symptoms and signs involving the musculoskeletal system  Difficulty in walking, not elsewhere  classified  Foot drop, right  Foot drop, left  Rationale for Evaluation and Treatment: Rehabilitation  SUBJECTIVE:                                                                                                                                                                                             SUBJECTIVE STATEMENT: Pt ambulates into PT gym using 2WW mod I and attempting continuous reciprocal gait w/ increased UE use and some increased pace this visit.  Denies any acute changes since last visit, ongoing R shoulder pain when he reaches behind his back, otherwise does not have pain.  No falls.  Pt accompanied by: self and Mom dropped him off  PERTINENT HISTORY: vitamin D  insufficiency, Type 1 IDDM w/ polyneuropathy, diabetic retinopathy, hypothyroidism, HTN, hypercholestrolemia, h/o Rt tibia fracture, h/o Lt ankle fracture with ORIF, MS with acquired diplegia (MS diagnosis 2014/2015)  In the week prior to his cardiac arrest he reported having some near syncopal episodes.  On 3/31 he went into witnessed cardiac arrest w/ bystander performing CPR for 20-25 minutes.  Per EMS notes he had persistent V-fib requiring 8 total defibrillations, 2 doses of epi and some ROSC w/ V tach.  He was also given Narcan in the field due to small pupils.  PAIN:  Are you having pain? No  PRECAUTIONS: Fall and ICD/Pacemaker (Can raise R shoulder as of May 9th - 6wks post placement of ICD; monitor BP - if systolic drops below 100 he is to follow-up with Cardiologist about medication management)  RED FLAGS: None   WEIGHT BEARING RESTRICTIONS: No  FALLS: Has patient fallen in last 6 months? No  LIVING ENVIRONMENT: Lives with: lives with their partner Lives in: House/apartment Stairs: Yes: External: 5 steps; on right going up, on left going up, and can reach both Has following equipment at home: Single point cane, Walker - 2 wheeled, Wheelchair (manual), and shower chair  PLOF: Requires assistive  device for independence and Needs assistance with homemaking  PATIENT GOALS: fix my brace and if there's anything we can do for my shoulder   OBJECTIVE:  Note: Objective measures were completed at Evaluation unless otherwise noted.  DIAGNOSTIC FINDINGS:  MRI Cardiac 08/29/2023: IMPRESSION:  1. Mild to moderate decrease in left ventricular systolic function (LVEF =40%).   2. There is no late gadolinium enhancement in the left ventricular myocardium. Normal parametric mapping and tissue characterization.   3. Mild decrease in right ventricular systolic function (RVEF =45%).  CT Head 08/26/2023: IMPRESSION: 1. No evidence of acute intracranial abnormality. 2. White matter lesions better characterized on prior MRI.  CT Angio Chest PE 08/26/2023: IMPRESSION: 1. No pulmonary embolism. 2. Moderate left anterior descending coronary artery calcification. 3. Extensive dependent pulmonary consolidation throughout the lungs which may relate to aspiration or multifocal infection.  *Updated imaging ordered by Dr. Godwin Lat (MRI of brain and C-spine)   COGNITION: Overall cognitive status: Within functional limits for tasks assessed   SENSATION: Light touch: WFL   MUSCLE TONE: Increased extensor tone of LLE >RLE but ROM and coordination limited bilaterally due to extensor tone/spasticity    POSTURE: rounded shoulders, forward head, and weight shift right  LOWER EXTREMITY ROM:   Limited by extensor tone/spasticity   Active  Right Eval Left Eval  Hip flexion    Hip extension    Hip abduction    Hip adduction    Hip internal rotation    Hip external rotation    Knee flexion    Knee extension    Ankle dorsiflexion    Ankle plantarflexion    Ankle inversion    Ankle eversion     (Blank rows = not tested)  LOWER EXTREMITY MMT:  Tested in seated position   MMT Right Eval Left Eval  Hip flexion 1 1  Hip extension    Hip abduction 4- 4-  Hip adduction    Hip internal rotation     Hip external rotation    Knee flexion 2 3-  Knee extension 3+ 1  Ankle dorsiflexion AFO donned AFO donned  Ankle plantarflexion    Ankle inversion    Ankle eversion    (Blank rows = not tested)  BED MOBILITY:  Independent per pt - still in a standard bed, needs increased time to roll L and R  TRANSFERS: Assistive device utilized: Environmental consultant - 2 wheeled  Sit to stand: SBA Stand to sit: SBA Heavy reliance on BUEs to essentially throw himself out of chair, wide BOS, better hip extension today when standing to 2WW, when he lets go he maintains anterior lean in attempt to prevent knees locking out, when knees lock out he loses his balance.   GAIT: Gait pattern: step to pattern, decreased step length- Left, decreased stride length, decreased hip/knee flexion- Right, decreased hip/knee flexion- Left, decreased ankle dorsiflexion- Right, decreased ankle dorsiflexion- Left, lateral hip instability, trunk flexed, narrow BOS, and poor foot clearance- Left Distance walked: Various clinic distances  Assistive device utilized: Walker - 2 wheeled Level of assistance: SBA Comments: Pt required 9 minutes to ambulate into treatment room (~30') , frequently stopping to rest as his legs lock up. Noted excessive inversion of L ankle as well as scissoring of LLE. Pt relying heavily on BUEs to perform more of a swing-to pattern.   FUNCTIONAL TESTS:  TREATMENT: 11-11-23   TherAct:   SpO2:  98%, HR 69bpm, RPE at rest 0/10  3 rounds (superset style): -15 10lb slamball -10 (each side) twists w/ 10lb ball  Round 1:  98%, 78bpm, 2/10 RPE  Round 2:  98%, 92bpm, 5/10  Round 3:  98%, 91bpm, 5/10, reports core fatigue and posterior LOB on last 2 reps  3 rounds (superset style): -10 CW waist 6lb around the worlds > 10 CCW -10 6lb ball toss and catch   Round 1: 98%, 80bpm,  1-2/10 RPE  Round 2:  98%, 83bpm, 1-2/10, reports core fatigue and has posterior LOB in sitting on last rep  Round 3:  98%, 85bpm, 1/10  2 rounds (superset style): -12 tricep press w/ paddles x12, full posterior LOB onto mat at last rep due to shoulder and core fatigue -20 crossbody punches to external targets  Round 1:  98%, 84bpm, 2/10 RPE  Round 2:  98%, 82bpm, 2/10, repeated posterior LOB due to core fatigue, pt needs further work to prevent catching w/ shoulders in anterior positioning  PATIENT EDUCATION: Education details: RPE and HR response/expected recovery b/w sets.  Continue HEP.  Cooling vest follow-up. Person educated: Patient Education method: Explanation, Demonstration, Tactile cues, Verbal cues, and Handouts Education comprehension: verbalized understanding, returned demonstration, verbal cues required, tactile cues required, and needs further education  HOME EXERCISE PROGRAM:  Access Code: [email or web address] URL: [email or web address] Date: 10/28/2023 Prepared by: Marilou Showman  Exercises - Supine Thoracic Mobilization Towel Roll Vertical with Arm Stretch  - 1 x daily - 5 x weekly - 1 sets - 2 minutes hold - Supine Lower Trunk Rotation  - 1 x daily - 5 x weekly - 2 sets - 10 reps - Seated Gastroc Stretch with Strap  - 1 x daily - 7 x weekly - 2 sets - 60 second hold - Standing Gastroc Stretch at Counter  - 2 x daily - 7 x weekly - 1 sets - 3 reps - 30 sec hold - Heel Sits  - 1 x daily - 5 x weekly - 1 sets - 12 reps - Tall Kneeling Diagonal Lift with Medicine Ball  - 1 x daily - 5 x weekly - 1 sets - 5-10 reps - Marching Bridge  - 1 x daily - 5 x weekly - 1 sets - 6 reps - Clamshell  - 1 x daily - 5 x weekly - 1-2 sets - 10 reps - Long Sitting Hamstring Stretch  - 1 x daily - 5 x weekly - 1 sets - 2-3 reps - 30-45 seconds hold - Seated Knee Flexion  - 1 x daily - 5 x weekly - 1-2 sets - 4 reps - Seated Hip Abduction with Resistance  - 1 x daily - 5 x weekly  - 2 sets - 10 reps - Seated Scapular Retraction  - 1 x daily - 7 x weekly - 3 sets - 10 reps - Single Arm Doorway Pec Stretch at 90 Degrees Abduction  - 1 x daily - 7 x weekly - 1 sets - 3-5 reps - 30 sec each hold  HEP updated:  for shoulder strengthening and stretching Access Code: [email or web address] URL: [email or web address] Date: 11/06/2023 Prepared by: Johnnette Nakayama  Exercises - Standing Bilateral Shoulder Internal Rotation AAROM with Dowel  - 1 x daily - 7 x weekly - 3 sets - 10 reps - Standing Shoulder Extension with Dowel  - 1 x daily - 7 x weekly - 3 sets - 10 reps -  Standing Overhead Shoulder External Rotation Stretch with Towel  - 1 x daily - 7 x weekly - 3 sets - 10 reps - Corner Pec Major Stretch  - 1 x daily - 7 x weekly - 1 sets - 2-3 reps - 15-20 hold - Doorway Pec Stretch at 120 Degrees Abduction  - 1 x daily - 7 x weekly - 1 sets - 2-3 reps - 15 hold  GOALS: Goals reviewed with patient? Yes  SHORT TERM GOALS: Target date: 11/08/2023  PT will review, condense and modify HEP to meet current functional level. Baseline: to be reviewed from previous POC Goal status: Goal met 11-07-23   Patient will obtain cooling vest from [employer] to improve ability to continue activity in warmer months. Baseline: PT will fax request to [employer] if pt unable to obtain on his own; pt has not followed up and we will do this next visit (6/16) Goal status: IN PROGRESS   Hanger rep and PT to further assess and adjust left AFO if able to improve seating into brace for improved utilization. Baseline: PT contacted Olivia at [facility] and followed up, pt wants Orthotist to come to clinic so will likely be later appt (6/16) Goal status: IN PROGRESS  LONG TERM GOALS: Target date: 12/06/2023   Patient will tolerate low to moderate level cardio focused exercise for >/=10 minutes w/ RPE of no more than 5/10 in order to demonstrate tolerance to steady state activity. Baseline: To be  introduced. Goal status: INITIAL  Patient will verbalize strategies for energy conservation to improve compliance to safety recommendations. Baseline: To be reviewed. Goal status: INITIAL  Patient will report no right shoulder pain with light activity in order to improve functional use of UE. Baseline: 1-2/10 w/ repetition Goal status: INITIAL  4.  Patient will demonstrate unsupported standing x30 seconds without LOB in order to improve upright safety and functional participation in ADLs. Baseline:  BLE lockout w/ LOB  Goal status:  INITIAL  5.  Patient will demonstrate reach to floor from standing at no more than supervision level to improve functional upright mobility and independence. Baseline:  Cannot reach to floor without BLE collapsing  Goal status:  INITIAL  ASSESSMENT:  CLINICAL IMPRESSION: PT to follow-up regarding cooling vest next session if pt has not done this himself.  Orthotist to attend future session per pt preference, this PT is awaiting coordinated date.  He has low rate of perceived exertion throughout session despite repeated posterior LOB and shoulder fatigue which highlights need for further education addressing insight to functional signs/symptoms related to diagnosis.  He continues to need encouragement to rest or modify activity.  PT to continue per POC with focus on cardiac stability, shoulder pain management, and AFO modification as needed.    OBJECTIVE IMPAIRMENTS: Abnormal gait, cardiopulmonary status limiting activity, decreased activity tolerance, decreased balance, decreased cognition, decreased coordination, decreased endurance, decreased knowledge of condition, decreased knowledge of use of DME, decreased mobility, difficulty walking, decreased ROM, decreased strength, decreased safety awareness, impaired perceived functional ability, increased muscle spasms, impaired sensation, impaired tone, improper body mechanics, and pain  ACTIVITY LIMITATIONS:  carrying, lifting, bending, standing, squatting, stairs, transfers, reach over head, locomotion level, and caring for others  PARTICIPATION LIMITATIONS: cleaning, driving, shopping, community activity, occupation, and yard work  PERSONAL FACTORS: Behavior pattern, Education, Fitness, Past/current experiences, and 1-2 comorbidities: MS and significant cardiac involvement are also affecting patient's functional outcome.   REHAB POTENTIAL: Fair see personal factors and PMH  CLINICAL  DECISION MAKING: Unstable/unpredictable  EVALUATION COMPLEXITY: High  PLAN:  PT FREQUENCY: 1-2x/week (2x/wk for initial 4 wks and 1x/wk for last 4 wks)  PT DURATION: 8 weeks  PLANNED INTERVENTIONS: 97164- PT Re-evaluation, 97750- Physical Performance Testing, 97110-Therapeutic exercises, 97530- Therapeutic activity, [id number]- Neuromuscular re-education, 97535- Self Care, 16109- Manual therapy, [id number]- Gait training, [phone]- Orthotic Initial, [phone]- Orthotic/Prosthetic subsequent, [phone]- Aquatic Therapy, [phone]- Electrical stimulation (manual), Patient/Family education, Balance training, Stair training, Taping, Dry Needling, Joint mobilization, DME instructions, Wheelchair mobility training, Cryotherapy, and Moist heat  PLAN FOR NEXT SESSION:  Cont cardio exercises and activities - add SciFit as pt too fatigued for this ex. On 11-07-23, if he has not looked into cooling vest PT will likely have to locate applicate for financial assistance with cooling vest on [employer] website!  Reaching to floor level.  Standing balance, AFO fit - waiting for appt coordination!  Cardiac focus - modified BORG response and circuit style training.   Earlean Glaze, PT, DPT  11/11/2023, 10:22 AM

## 2023-11-12 NOTE — Telephone Encounter (Signed)
 Copied from CRM [id number]. Topic: Referral - Status >> Nov 12, 2023  2:41 PM Dewanda Foots wrote: Reason for CRM: Pt is calling back frustrated. He states he spoke to the PCP about this months ago and no referral was ever sent over for the growth on his leg. He called again Friday (6/13) and yesterday 6/16 and no one called him back. Called the CAL and spoke to someone who states there is no documentation on this but that she is going to request Jaz call the patient back about this to discuss.  Pt would like this to be situated so he can figure out what to do next steps.  Pt callback number is [phone]

## 2023-11-12 NOTE — Telephone Encounter (Signed)
 Pt has called back asking for a status update. Pt states he spoke with Dr. Rochelle Chu and asked for a referral 'a couple of months ago'.  Please call pt to discuss: [phone]

## 2023-11-13 ENCOUNTER — Other Ambulatory Visit: Payer: Self-pay | Admitting: Internal Medicine

## 2023-11-13 ENCOUNTER — Encounter: Payer: Self-pay | Admitting: Internal Medicine

## 2023-11-13 ENCOUNTER — Telehealth: Payer: Self-pay | Admitting: Nutrition

## 2023-11-13 DIAGNOSIS — R2241 Localized swelling, mass and lump, right lower limb: Secondary | ICD-10-CM | POA: Insufficient documentation

## 2023-11-13 NOTE — Telephone Encounter (Signed)
 Patient reports that in the last 2 weeks, his blood sugars have dropped low at 2AM 8 times.  He drinks 1 cup of apple juice and at 6AM, his blood sugar is around 75-80.  He verifies these readings with a fingerstick and they range anywhere from 59-75.  He denies any other lows during the day.   Current basal rate is MN: 0.125 4AM: 1.25,  6AM: 1.05, 9AM: 1.25, 9PM: 0.125, 10PM: 0.100. Plan:  decrease basal rate at MN to 0.075.  Call Monday with blood sugar readings.

## 2023-11-13 NOTE — Telephone Encounter (Signed)
 Spoke with patient, walked him through how to send [employer] over the phone. Awaiting message to route to PCP

## 2023-11-13 NOTE — Telephone Encounter (Signed)
 Copied from CRM [id number]. Topic: Referral - Question >> Nov 13, 2023  2:20 PM Baldo Levan wrote: Reason for CRM: Lenon Radar from [facility] calling in regarding the referral that was sent over, according to the picture it looks like it is on the patients knee. They do not do anything on the knee or joint. They would recommend orthopedics for the patient. If any questions please contact the office back at [phone].

## 2023-11-13 NOTE — Telephone Encounter (Signed)
 Can he send a photo of the mass?

## 2023-11-14 ENCOUNTER — Ambulatory Visit: Admitting: Physical Therapy

## 2023-11-18 ENCOUNTER — Ambulatory Visit: Admitting: Physical Therapy

## 2023-11-18 VITALS — HR 75

## 2023-11-18 DIAGNOSIS — R2689 Other abnormalities of gait and mobility: Secondary | ICD-10-CM | POA: Diagnosis not present

## 2023-11-18 DIAGNOSIS — R29898 Other symptoms and signs involving the musculoskeletal system: Secondary | ICD-10-CM | POA: Diagnosis not present

## 2023-11-18 DIAGNOSIS — M21372 Foot drop, left foot: Secondary | ICD-10-CM | POA: Diagnosis not present

## 2023-11-18 DIAGNOSIS — M6281 Muscle weakness (generalized): Secondary | ICD-10-CM | POA: Diagnosis not present

## 2023-11-18 DIAGNOSIS — R2681 Unsteadiness on feet: Secondary | ICD-10-CM | POA: Diagnosis not present

## 2023-11-18 DIAGNOSIS — M25511 Pain in right shoulder: Secondary | ICD-10-CM | POA: Diagnosis not present

## 2023-11-18 DIAGNOSIS — R262 Difficulty in walking, not elsewhere classified: Secondary | ICD-10-CM | POA: Diagnosis not present

## 2023-11-18 DIAGNOSIS — R29818 Other symptoms and signs involving the nervous system: Secondary | ICD-10-CM | POA: Diagnosis not present

## 2023-11-18 DIAGNOSIS — M21371 Foot drop, right foot: Secondary | ICD-10-CM | POA: Diagnosis not present

## 2023-11-18 NOTE — Therapy (Addendum)
 OUTPATIENT PHYSICAL THERAPY NEURO TREATMENT   Patient Name: Johnathan Ross MRN: [id number] DOB:03-29-1971, 53 y.o., male Today's Date: 11/19/2023   PCP: [name], MD  REFERRING PROVIDER: Pegge Toribio PARAS, PA-C  END OF SESSION:  PT End of Session - 11/19/23 2001     Visit Number 7    Number of Visits 12   12 + eval   Date for PT Re-Evaluation 12/20/23   pushed out due to scheduling delay   Authorization Type [employer]    Authorization Time Period 10-08-23 - 12-20-23    Authorization - Visit Number 7    Authorization - Number of Visits 12    PT Start Time 0932    PT Stop Time 1015    PT Time Calculation (min) 43 min    Equipment Utilized During Treatment Gait belt    Activity Tolerance Patient tolerated treatment well    Behavior During Therapy [facility] for tasks assessed/performed             Past Medical History:  Diagnosis Date   Hyperlipidemia    Hypertension    Hypogonadism in male 11/01/2016   Hypothyroidism    Multiple sclerosis (HCC)    Neuromuscular disorder (HCC)    Proliferative diabetic retinopathy(362.02)    Type 1 diabetes mellitus (HCC) dx'd 1994   Vitamin D  insufficiency 11/29/2016   Past Surgical History:  Procedure Laterality Date   EYE SURGERY Bilateral    laser for diabetic retinopathy   FRACTURE SURGERY     ICD IMPLANT N/A 08/30/2023   Procedure: ICD IMPLANT;  Surgeon: Inocencio Soyla Lunger, MD;  Location: [facility] IN[facility];  Service: Cardiovascular;  Laterality: N/A;   IR VENO/EXT/UNI LEFT  03/24/2020   OPEN REDUCTION INTERNAL FIXATION (ORIF) TIBIA/FIBULA FRACTURE Right 10/30/2016   ORIF ANKLE FRACTURE Left 2015   ORIF TIBIA FRACTURE Right 10/30/2016   Procedure: OPEN REDUCTION INTERNAL FIXATION (ORIF) TIBIA FIBULA FRACTURE;  Surgeon: Celena Sharper, MD;  Location: [facility] OR;  Service: Orthopedics;  Laterality: Right;   RETINAL LASER PROCEDURE Bilateral    for diabetic retinopathy   RIGHT/LEFT HEART CATH AND CORONARY ANGIOGRAPHY  N/A 08/26/2023   Procedure: RIGHT/LEFT HEART CATH AND CORONARY ANGIOGRAPHY;  Surgeon: Gardenia Led, DO;  Location: [facility] IN[facility];  Service: Cardiovascular;  Laterality: N/A;   Patient Active Problem List   Diagnosis Date Noted   Mass of right lower leg 11/13/2023   Attention and concentration deficit 09/10/2023   Acute encephalopathy 09/03/2023   Cardiac arrest (HCC) 08/26/2023   Encounter for screening for HIV 06/17/2023   Need for immunization against influenza 06/17/2023   Loud snoring 06/17/2023   Encounter for general adult medical examination with abnormal findings 08/02/2022   Screening for prostate cancer 08/01/2021   OAB (overactive bladder) 09/21/2020   Essential hypertension 09/21/2020   Thoracic outlet syndrome 05/03/2020   Controlled type 1 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 (HCC) 03/24/2020   Right epiretinal membrane 03/24/2020   Retinal exudates and deposits 03/24/2020   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   Nuclear sclerotic cataract of both eyes 03/24/2020   Gait disorder 03/23/2020   Benign prostatic hyperplasia without lower urinary tract symptoms 03/01/2020   Diabetic gastroparesis associated with type 1 diabetes mellitus (HCC) 03/01/2020   Gait disturbance 08/19/2019   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/19/2019   Vitamin D  insufficiency 11/29/2016   Hypogonadism in male 11/01/2016   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HCC)    Hypothyroidism  Hypertension    Dyslipidemia, goal LDL below 130    MULTIPLE SCLEROSIS 08/29/2007   PROLIFERATIVE DIABETIC RETINOPATHY 08/29/2007    ONSET DATE: 08/26/2023  REFERRING DIAG: G93.40 (ICD-10-CM) - Acute encephalopathy  THERAPY DIAG:  Other abnormalities of gait and mobility  Muscle weakness (generalized)  Unsteadiness on feet  Rationale for Evaluation and Treatment: Rehabilitation  SUBJECTIVE:                                                                                                                                                                                              SUBJECTIVE STATEMENT: Pt amb. To PT today with use of RW - using reciprocal gait pattern with standing rest breaks prn; pt reports the shoulder exercises given for HEP are going well at home.  Pt reports he doesn't have shoulder pain unless he reaches behind his back (internally rotates) to do something with his insulin  pump which is located on the left side of his low back  Denies any acute changes since last visit, ongoing R shoulder pain when he reaches behind his back, otherwise does not have pain.  Pt accompanied by: self and Mom dropped him off  PERTINENT HISTORY: vitamin D  insufficiency, Type 1 IDDM w/ polyneuropathy, diabetic retinopathy, hypothyroidism, HTN, hypercholestrolemia, h/o Rt tibia fracture, h/o Lt ankle fracture with ORIF, MS with acquired diplegia (MS diagnosis 2014/2015)  In the week prior to his cardiac arrest he reported having some near syncopal episodes.  On 3/31 he went into witnessed cardiac arrest w/ bystander performing CPR for 20-25 minutes.  Per EMS notes he had persistent V-fib requiring 8 total defibrillations, 2 doses of epi and some ROSC w/ V tach.  He was also given Narcan in the field due to small pupils.  PAIN:  Are you having pain? No  PRECAUTIONS: Fall and ICD/Pacemaker (Can raise R shoulder as of May 9th - 6wks post placement of ICD; monitor BP - if systolic drops below 100 he is to follow-up with Cardiologist about medication management)  RED FLAGS: None   WEIGHT BEARING RESTRICTIONS: No  FALLS: Has patient fallen in last 6 months? No  LIVING ENVIRONMENT: Lives with: lives with their partner Lives in: House/apartment Stairs: Yes: External: 5 steps; on right going up, on left going up, and can reach both Has following equipment at home: Single point cane, Walker - 2 wheeled, Wheelchair (manual), and shower  chair  PLOF: Requires assistive device for independence and Needs assistance with homemaking  PATIENT GOALS: fix my brace and if there's anything we can do for my shoulder   OBJECTIVE:  Note: Objective measures were completed  at Evaluation unless otherwise noted.  DIAGNOSTIC FINDINGS:  MRI Cardiac 08/29/2023: IMPRESSION: 1. Mild to moderate decrease in left ventricular systolic function (LVEF =40%).   2. There is no late gadolinium enhancement in the left ventricular myocardium. Normal parametric mapping and tissue characterization.   3. Mild decrease in right ventricular systolic function (RVEF =45%).  CT Head 08/26/2023: IMPRESSION: 1. No evidence of acute intracranial abnormality. 2. White matter lesions better characterized on prior MRI.  CT Angio Chest PE 08/26/2023: IMPRESSION: 1. No pulmonary embolism. 2. Moderate left anterior descending coronary artery calcification. 3. Extensive dependent pulmonary consolidation throughout the lungs which may relate to aspiration or multifocal infection.  *Updated imaging ordered by Dr. Vear (MRI of brain and C-spine)   COGNITION: Overall cognitive status: Within functional limits for tasks assessed   SENSATION: Light touch: WFL   MUSCLE TONE: Increased extensor tone of LLE >RLE but ROM and coordination limited bilaterally due to extensor tone/spasticity    POSTURE: rounded shoulders, forward head, and weight shift right  LOWER EXTREMITY ROM:   Limited by extensor tone/spasticity   Active  Right Eval Left Eval  Hip flexion    Hip extension    Hip abduction    Hip adduction    Hip internal rotation    Hip external rotation    Knee flexion    Knee extension    Ankle dorsiflexion    Ankle plantarflexion    Ankle inversion    Ankle eversion     (Blank rows = not tested)  LOWER EXTREMITY MMT:  Tested in seated position   MMT Right Eval Left Eval  Hip flexion 1 1  Hip extension    Hip abduction 4- 4-  Hip  adduction    Hip internal rotation    Hip external rotation    Knee flexion 2 3-  Knee extension 3+ 1  Ankle dorsiflexion AFO donned AFO donned  Ankle plantarflexion    Ankle inversion    Ankle eversion    (Blank rows = not tested)  BED MOBILITY:  Independent per pt - still in a standard bed, needs increased time to roll L and R  TRANSFERS: Assistive device utilized: Environmental consultant - 2 wheeled  Sit to stand: SBA Stand to sit: SBA Heavy reliance on BUEs to essentially throw himself out of chair, wide BOS, better hip extension today when standing to 2WW, when he lets go he maintains anterior lean in attempt to prevent knees locking out, when knees lock out he loses his balance.   GAIT: Gait pattern: step to pattern, decreased step length- Left, decreased stride length, decreased hip/knee flexion- Right, decreased hip/knee flexion- Left, decreased ankle dorsiflexion- Right, decreased ankle dorsiflexion- Left, lateral hip instability, trunk flexed, narrow BOS, and poor foot clearance- Left Distance walked: Various clinic distances  Assistive device utilized: Walker - 2 wheeled Level of assistance: SBA Comments: Pt required 9 minutes to ambulate into treatment room (~30') , frequently stopping to rest as his legs lock up. Noted excessive inversion of L ankle as well as scissoring of LLE. Pt relying heavily on BUEs to perform more of a swing-to pattern.   FUNCTIONAL TESTS:  TREATMENT: 11-18-23   Gait:  pt amb. From clinic lobby to UBE machine with use of RW using a reciprocal gait pattern with standing rest breaks prn heart rate 84 bpm;  02 saturation 98% after ambulating this distance  TherAct:   In seated position on mat table - pt performed 10# slam ball - throwing on floor and catching 10 reps x 2 sets;     Pt held 10# kettlebell in front, close to his  body and performed trunk rotation holding kettlebell 10 reps x 2 sets - rest period between sets; HR 80 bpm Pt performed trunk rotation holding 10# slam ball 5 reps:  02 98%, HR 83 In standing position - performed 5 reps slam ball (10#):  02 99%, HR 80  Performed circles CW in sitting with 5# ball 10 reps CW, 10x CCW with 2 kg medicine ball;  02 97%, HR 91  In seated position - lifted 10# kettlebell from floor from Rt side of RLE to place on Lt side, beside LLE - had to use 1 UE support for assist with balance with this activity 02: 97%, HR 71  NeuroRe-ed: Tall kneeling position with kay bench in front - pt lifted 10# kettle bell 5 reps to approx. 90 degrees flexion - CGA for stability for balance 10 hip hinges in tall kneeling 02 98%, HR 72  TherEx:  SciFit - level 3.0 x 6 with Ue's & LE's for cardiovascular conditioning  HR 81 bpm, 02 rate 98% at end of session   HEP updated:  for shoulder strengthening and stretching Access Code: [email or web address] URL: [email or web address] Date: 11/06/2023 Prepared by: Rock Kussmaul  Exercises - Standing Bilateral Shoulder Internal Rotation AAROM with Dowel  - 1 x daily - 7 x weekly - 3 sets - 10 reps - Standing Shoulder Extension with Dowel  - 1 x daily - 7 x weekly - 3 sets - 10 reps - Standing Overhead Shoulder External Rotation Stretch with Towel  - 1 x daily - 7 x weekly - 3 sets - 10 reps - Corner Pec Major Stretch  - 1 x daily - 7 x weekly - 1 sets - 2-3 reps - 15-20 hold - Doorway Pec Stretch at 120 Degrees Abduction  - 1 x daily - 7 x weekly - 1 sets - 2-3 reps - 15 hold   TherAct Tall kneeling on mat - pt performed 10 hip hinges with CGA; lifting bil. Ue's to 90 degrees for core stabilization 5 reps - pt c/o fatigue in low back musc. As this exercise was performed near end of session  Sit to stand from mat - pt required bil. UE support  Stood without knees hyperextended by mat table, but not touching mat - lifted 5# kettlebell  up to chest height 10 reps with CGA for stability    PATIENT EDUCATION: Education details: TPDN (handout provided), added to HEP Person educated: Patient Education method: Explanation, Demonstration, Tactile cues, Verbal cues, and Handouts Education comprehension: verbalized understanding, returned demonstration, verbal cues required, tactile cues required, and needs further education  HOME EXERCISE PROGRAM:  Access Code: [email or web address] URL: [email or web address] Date: 10/28/2023 Prepared by: Daved Bull  Exercises - Supine Thoracic Mobilization Towel Roll Vertical with Arm Stretch  - 1 x daily - 5 x weekly - 1 sets - 2 minutes hold - Supine Lower Trunk Rotation  - 1 x daily - 5 x weekly - 2 sets - 10 reps - Seated Gastroc Stretch with Strap  - 1 x  daily - 7 x weekly - 2 sets - 60 second hold - Standing Gastroc Stretch at Counter  - 2 x daily - 7 x weekly - 1 sets - 3 reps - 30 sec hold - Heel Sits  - 1 x daily - 5 x weekly - 1 sets - 12 reps - Tall Kneeling Diagonal Lift with Medicine Ball  - 1 x daily - 5 x weekly - 1 sets - 5-10 reps - Marching Bridge  - 1 x daily - 5 x weekly - 1 sets - 6 reps - Clamshell  - 1 x daily - 5 x weekly - 1-2 sets - 10 reps - Long Sitting Hamstring Stretch  - 1 x daily - 5 x weekly - 1 sets - 2-3 reps - 30-45 seconds hold - Seated Knee Flexion  - 1 x daily - 5 x weekly - 1-2 sets - 4 reps - Seated Hip Abduction with Resistance  - 1 x daily - 5 x weekly - 2 sets - 10 reps - Seated Scapular Retraction  - 1 x daily - 7 x weekly - 3 sets - 10 reps - Single Arm Doorway Pec Stretch at 90 Degrees Abduction  - 1 x daily - 7 x weekly - 1 sets - 3-5 reps - 30 sec each hold  GOALS: Goals reviewed with patient? Yes  SHORT TERM GOALS: Target date: 11/08/2023  PT will review, condense and modify HEP to meet current functional level. Baseline: to be reviewed from previous POC Goal status: Goal met 11-07-23   Patient will obtain cooling vest from  [employer] to improve ability to continue activity in warmer months. Baseline: PT will fax request to [employer] if pt unable to obtain on his own.  Pt has not followed up and we will do this next visit (6/16) Goal status: IN PROGRESS    Hanger rep and PT to further assess and adjust left AFO if able to improve seating into brace for improved utilization. Baseline: PT contacted Olivia at [facility] and followed up, pt wants Orthotist to come to clinic so will likely be later appt (6/16) Goal status: IN PROGRESS   LONG TERM GOALS: Target date: 12/06/2023   Patient will tolerate low to moderate level cardio focused exercise for >/=10 minutes w/ RPE of no more than 5/10 in order to demonstrate tolerance to steady state activity. Baseline: To be introduced. Goal status: INITIAL  Patient will verbalize strategies for energy conservation to improve compliance to safety recommendations. Baseline: To be reviewed. Goal status: INITIAL  Patient will report no right shoulder pain with light activity in order to improve functional use of UE. Baseline: 1-2/10 w/ repetition Goal status: INITIAL  4.  Patient will demonstrate unsupported standing x30 seconds without LOB in order to improve upright safety and functional participation in ADLs. Baseline:  BLE lockout w/ LOB  Goal status:  INITIAL  5.  Patient will demonstrate reach to floor from standing at no more than supervision level to improve functional upright mobility and independence. Baseline:  Cannot reach to floor without BLE collapsing  Goal status:  INITIAL  ASSESSMENT:  CLINICAL IMPRESSION: PT session focused on cardiovascular conditioning exercises and activities.  Pt reported fatigue in trunk musculature with performing trunk rotation in seated position holding 10# medicine ball, but able to take short rest break and resume exercises.  HR ranged from 72 bpm to 91 bpm in today's session.  Pt is limited in ability to perform activities in  standing without UE support due to decreased standing balance.  Pt tolerated exercises well overall with short rest breaks needed. Continue POC.    OBJECTIVE IMPAIRMENTS: Abnormal gait, cardiopulmonary status limiting activity, decreased activity tolerance, decreased balance, decreased cognition, decreased coordination, decreased endurance, decreased knowledge of condition, decreased knowledge of use of DME, decreased mobility, difficulty walking, decreased ROM, decreased strength, decreased safety awareness, impaired perceived functional ability, increased muscle spasms, impaired sensation, impaired tone, improper body mechanics, and pain  ACTIVITY LIMITATIONS: carrying, lifting, bending, standing, squatting, stairs, transfers, reach over head, locomotion level, and caring for others  PARTICIPATION LIMITATIONS: cleaning, driving, shopping, community activity, occupation, and yard work  PERSONAL FACTORS: Behavior pattern, Education, Fitness, Past/current experiences, and 1-2 comorbidities: MS and significant cardiac involvement are also affecting patient's functional outcome.   REHAB POTENTIAL: Fair see personal factors and PMH  CLINICAL DECISION MAKING: Unstable/unpredictable  EVALUATION COMPLEXITY: High  PLAN:  PT FREQUENCY: 1-2x/week (2x/wk for initial 4 wks and 1x/wk for last 4 wks)  PT DURATION: 8 weeks  PLANNED INTERVENTIONS: [id number]- PT Re-evaluation, 97750- Physical Performance Testing, 97110-Therapeutic exercises, 97530- Therapeutic activity, [id number]- Neuromuscular re-education, 97535- Self Care, [id number]- Manual therapy, [id number]- Gait training, [phone]- Orthotic Initial, [id number]- Orthotic/Prosthetic subsequent, [phone]- Aquatic Therapy, [phone]- Electrical stimulation (manual), Patient/Family education, Balance training, Stair training, Taping, Dry Needling, Joint mobilization, DME instructions, Wheelchair mobility training, Cryotherapy, and Moist heat  PLAN FOR NEXT SESSION:  Give paperwork for  cooling vest  Reaching to floor level.  Standing balance, AFO fit - Olivia from [place] scheduled to come on 12-09-23 for AFO;  Cardiac focus - modified BORG response and circuit style training.   Roxanna [place], PT  11/19/2023, 8:35 PM

## 2023-11-19 ENCOUNTER — Encounter: Payer: Self-pay | Admitting: Physical Therapy

## 2023-11-21 ENCOUNTER — Telehealth: Payer: Self-pay

## 2023-11-21 ENCOUNTER — Ambulatory Visit: Admitting: Physical Therapy

## 2023-11-21 DIAGNOSIS — R2689 Other abnormalities of gait and mobility: Secondary | ICD-10-CM | POA: Diagnosis not present

## 2023-11-21 DIAGNOSIS — M6281 Muscle weakness (generalized): Secondary | ICD-10-CM | POA: Diagnosis not present

## 2023-11-21 DIAGNOSIS — R29818 Other symptoms and signs involving the nervous system: Secondary | ICD-10-CM | POA: Diagnosis not present

## 2023-11-21 DIAGNOSIS — M21372 Foot drop, left foot: Secondary | ICD-10-CM | POA: Diagnosis not present

## 2023-11-21 DIAGNOSIS — R2681 Unsteadiness on feet: Secondary | ICD-10-CM | POA: Diagnosis not present

## 2023-11-21 DIAGNOSIS — R262 Difficulty in walking, not elsewhere classified: Secondary | ICD-10-CM | POA: Diagnosis not present

## 2023-11-21 DIAGNOSIS — M21371 Foot drop, right foot: Secondary | ICD-10-CM | POA: Diagnosis not present

## 2023-11-21 DIAGNOSIS — R29898 Other symptoms and signs involving the musculoskeletal system: Secondary | ICD-10-CM | POA: Diagnosis not present

## 2023-11-21 DIAGNOSIS — M25511 Pain in right shoulder: Secondary | ICD-10-CM | POA: Diagnosis not present

## 2023-11-21 NOTE — Therapy (Signed)
 OUTPATIENT PHYSICAL THERAPY NEURO TREATMENT   Patient Name: Johnathan Ross MRN: [id number] DOB:April 19, 1971, 53 y.o., male Today's Date: 11/22/2023   PCP: [name], MD  REFERRING PROVIDER: Pegge Toribio PARAS, PA-C  END OF SESSION:  PT End of Session - 11/22/23 1518     Visit Number 8    Number of Visits 12   12 + eval   Date for PT Re-Evaluation 12/20/23   pushed out due to scheduling delay   Authorization Type [employer]    Authorization Time Period 10-08-23 - 12-20-23    Authorization - Number of Visits 12    PT Start Time 0931    PT Stop Time 1017    PT Time Calculation (min) 46 min    Equipment Utilized During Treatment Gait belt    Activity Tolerance Patient tolerated treatment well    Behavior During Therapy [facility] for tasks assessed/performed              Past Medical History:  Diagnosis Date   Hyperlipidemia    Hypertension    Hypogonadism in male 11/01/2016   Hypothyroidism    Multiple sclerosis (HCC)    Neuromuscular disorder (HCC)    Proliferative diabetic retinopathy(362.02)    Type 1 diabetes mellitus (HCC) dx'd 1994   Vitamin D  insufficiency 11/29/2016   Past Surgical History:  Procedure Laterality Date   EYE SURGERY Bilateral    laser for diabetic retinopathy   FRACTURE SURGERY     ICD IMPLANT N/A 08/30/2023   Procedure: ICD IMPLANT;  Surgeon: Inocencio Soyla Lunger, MD;  Location: [facility] INVASIVE CV LAB;  Service: Cardiovascular;  Laterality: N/A;   IR VENO/EXT/UNI LEFT  03/24/2020   OPEN REDUCTION INTERNAL FIXATION (ORIF) TIBIA/FIBULA FRACTURE Right 10/30/2016   ORIF ANKLE FRACTURE Left 2015   ORIF TIBIA FRACTURE Right 10/30/2016   Procedure: OPEN REDUCTION INTERNAL FIXATION (ORIF) TIBIA FIBULA FRACTURE;  Surgeon: Celena Sharper, MD;  Location: [facility] OR;  Service: Orthopedics;  Laterality: Right;   RETINAL LASER PROCEDURE Bilateral    for diabetic retinopathy   RIGHT/LEFT HEART CATH AND CORONARY ANGIOGRAPHY N/A 08/26/2023   Procedure:  RIGHT/LEFT HEART CATH AND CORONARY ANGIOGRAPHY;  Surgeon: Gardenia Led, DO;  Location: [facility] INV[facility];  Service: Cardiovascular;  Laterality: N/A;   Patient Active Problem List   Diagnosis Date Noted   Mass of right lower leg 11/13/2023   Attention and concentration deficit 09/10/2023   Acute encephalopathy 09/03/2023   Cardiac arrest (HCC) 08/26/2023   Encounter for screening for HIV 06/17/2023   Need for immunization against influenza 06/17/2023   Loud snoring 06/17/2023   Encounter for general adult medical examination with abnormal findings 08/02/2022   Screening for prostate cancer 08/01/2021   OAB (overactive bladder) 09/21/2020   Essential hypertension 09/21/2020   Thoracic outlet syndrome 05/03/2020   Controlled type 1 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 (HCC) 03/24/2020   Right epiretinal membrane 03/24/2020   Retinal exudates and deposits 03/24/2020   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   Nuclear sclerotic cataract of both eyes 03/24/2020   Gait disorder 03/23/2020   Benign prostatic hyperplasia without lower urinary tract symptoms 03/01/2020   Diabetic gastroparesis associated with type 1 diabetes mellitus (HCC) 03/01/2020   Gait disturbance 08/19/2019   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/19/2019   Vitamin D  insufficiency 11/29/2016   Hypogonadism in male 11/01/2016   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HCC)    Hypothyroidism    Hypertension    Dyslipidemia,  goal LDL below 130    MULTIPLE SCLEROSIS 08/29/2007   PROLIFERATIVE DIABETIC RETINOPATHY 08/29/2007    ONSET DATE: 08/26/2023  REFERRING DIAG: G93.40 (ICD-10-CM) - Acute encephalopathy  THERAPY DIAG:  Other abnormalities of gait and mobility  Muscle weakness (generalized)  Other symptoms and signs involving the nervous system  Rationale for Evaluation and Treatment: Rehabilitation  SUBJECTIVE:                                                                                                                                                                                              SUBJECTIVE STATEMENT: Pt amb. To PT today with use of RW - using reciprocal gait pattern with standing rest breaks prn; pt reports no problems or changes since previous PT session on Tuesday. Pt was given paperwork packet for obtaining cooling vest through [employer] - informed pt to take it home and complete if he wanted to pursue obtaining this item, or he could bring it back to PT and therapist would help him to complete the application forms - pt verbalized understanding.  Denies any acute changes since last visit, ongoing R shoulder pain when he reaches behind his back, otherwise does not have pain.  Pt accompanied by: self and Mom dropped him off  PERTINENT HISTORY: vitamin D  insufficiency, Type 1 IDDM w/ polyneuropathy, diabetic retinopathy, hypothyroidism, HTN, hypercholestrolemia, h/o Rt tibia fracture, h/o Lt ankle fracture with ORIF, MS with acquired diplegia (MS diagnosis 2014/2015)  In the week prior to his cardiac arrest he reported having some near syncopal episodes.  On 3/31 he went into witnessed cardiac arrest w/ bystander performing CPR for 20-25 minutes.  Per EMS notes he had persistent V-fib requiring 8 total defibrillations, 2 doses of epi and some ROSC w/ V tach.  He was also given Narcan in the field due to small pupils.  PAIN:  Are you having pain? No  PRECAUTIONS: Fall and ICD/Pacemaker (Can raise R shoulder as of May 9th - 6wks post placement of ICD; monitor BP - if systolic drops below 100 he is to follow-up with Cardiologist about medication management)  RED FLAGS: None   WEIGHT BEARING RESTRICTIONS: No  FALLS: Has patient fallen in last 6 months? No  LIVING ENVIRONMENT: Lives with: lives with their partner Lives in: House/apartment Stairs: Yes: External: 5 steps; on right going up, on left going up, and can reach both Has  following equipment at home: Single point cane, Walker - 2 wheeled, Wheelchair (manual), and shower chair  PLOF: Requires assistive device for independence and Needs assistance with homemaking  PATIENT GOALS: fix my brace and if there's  anything we can do for my shoulder   OBJECTIVE:  Note: Objective measures were completed at Evaluation unless otherwise noted.  DIAGNOSTIC FINDINGS:  MRI Cardiac 08/29/2023: IMPRESSION: 1. Mild to moderate decrease in left ventricular systolic function (LVEF =40%).   2. There is no late gadolinium enhancement in the left ventricular myocardium. Normal parametric mapping and tissue characterization.   3. Mild decrease in right ventricular systolic function (RVEF =45%).  CT Head 08/26/2023: IMPRESSION: 1. No evidence of acute intracranial abnormality. 2. White matter lesions better characterized on prior MRI.  CT Angio Chest PE 08/26/2023: IMPRESSION: 1. No pulmonary embolism. 2. Moderate left anterior descending coronary artery calcification. 3. Extensive dependent pulmonary consolidation throughout the lungs which may relate to aspiration or multifocal infection.  *Updated imaging ordered by Dr. Vear (MRI of brain and C-spine)   COGNITION: Overall cognitive status: Within functional limits for tasks assessed   SENSATION: Light touch: WFL   MUSCLE TONE: Increased extensor tone of LLE >RLE but ROM and coordination limited bilaterally due to extensor tone/spasticity    POSTURE: rounded shoulders, forward head, and weight shift right  LOWER EXTREMITY ROM:   Limited by extensor tone/spasticity   Active  Right Eval Left Eval  Hip flexion    Hip extension    Hip abduction    Hip adduction    Hip internal rotation    Hip external rotation    Knee flexion    Knee extension    Ankle dorsiflexion    Ankle plantarflexion    Ankle inversion    Ankle eversion     (Blank rows = not tested)  LOWER EXTREMITY MMT:  Tested in seated  position   MMT Right Eval Left Eval  Hip flexion 1 1  Hip extension    Hip abduction 4- 4-  Hip adduction    Hip internal rotation    Hip external rotation    Knee flexion 2 3-  Knee extension 3+ 1  Ankle dorsiflexion AFO donned AFO donned  Ankle plantarflexion    Ankle inversion    Ankle eversion    (Blank rows = not tested)  BED MOBILITY:  Independent per pt - still in a standard bed, needs increased time to roll L and R  TRANSFERS: Assistive device utilized: Environmental consultant - 2 wheeled  Sit to stand: SBA Stand to sit: SBA Heavy reliance on BUEs to essentially throw himself out of chair, wide BOS, better hip extension today when standing to 2WW, when he lets go he maintains anterior lean in attempt to prevent knees locking out, when knees lock out he loses his balance.   GAIT: Gait pattern: step to pattern, decreased step length- Left, decreased stride length, decreased hip/knee flexion- Right, decreased hip/knee flexion- Left, decreased ankle dorsiflexion- Right, decreased ankle dorsiflexion- Left, lateral hip instability, trunk flexed, narrow BOS, and poor foot clearance- Left Distance walked: Various clinic distances  Assistive device utilized: Walker - 2 wheeled Level of assistance: SBA Comments: Pt required 9 minutes to ambulate into treatment room (~30') , frequently stopping to rest as his legs lock up. Noted excessive inversion of L ankle as well as scissoring of LLE. Pt relying heavily on BUEs to perform more of a swing-to pattern.   FUNCTIONAL TESTS:  TREATMENT: 11-21-23   Gait:  pt amb. From clinic lobby to 1st mat table in clinic gym with use of RW using a reciprocal gait pattern with standing rest breaks prn  At start of session - after ambulating from clinic lobby to gym - O2 = 98%, HR 62 bpm  TherAct: activities to improve  cardiovascular conditioning  Performed lifting 10# kettlebell from floor to overhead position in seated position;  performed trunk rotation with 10# kettlebell 10 reps;   02=98%,   HR 74  Diagonal lifting 10# kettlebell (kb) in pattern X - kb placed on stool --  10 reps each diagonal =  02 - 98%, HR 86 bpm  Around the world - 2kg medicine ball 10 reps; other direction 10 reps - pt in seated position   In standing -Slam ball 10# 10 reps; slam ball on Rt/lt sides 10 x each - no reading able to be obtained with pulse oximeter after this activity - pt leaned against mat table for assist with balance - with CGA to SBA from PT Sit to stand with bil. UE support from mat - 10x    02 - 97%,  HR 63 bpm Stand - slam ball 10# 10 reps  Pt then Amb. With RW with reciprocal gait pattern 115' x1 rep --  02 - 98%, HR  75 bpm  Tall kneeling position - pt performed 10 hip hinges Used 2kg medicince ball - made letter X pattern with ball with CGA to min assist for balance in tall kneeling with this activity In tall kneeling - performed Lifting 10# kb up from bench to head height  UBE  - level 3;  forward 3, backward 5  - 02 = 98%,  HR  72 bpm - after this exercise, at end of session     HEP updated:  for shoulder strengthening and stretching Access Code: [email or web address] URL: [email or web address] Date: 11/06/2023 Prepared by: Rock Kussmaul  Exercises - Standing Bilateral Shoulder Internal Rotation AAROM with Dowel  - 1 x daily - 7 x weekly - 3 sets - 10 reps - Standing Shoulder Extension with Dowel  - 1 x daily - 7 x weekly - 3 sets - 10 reps - Standing Overhead Shoulder External Rotation Stretch with Towel  - 1 x daily - 7 x weekly - 3 sets - 10 reps - Corner Pec Major Stretch  - 1 x daily - 7 x weekly - 1 sets - 2-3 reps - 15-20 hold - Doorway Pec Stretch at 120 Degrees Abduction  - 1 x daily - 7 x weekly - 1 sets - 2-3 reps - 15 hold    PATIENT EDUCATION: Education details: TPDN  (handout provided), added to HEP Person educated: Patient Education method: Explanation, Demonstration, Tactile cues, Verbal cues, and Handouts Education comprehension: verbalized understanding, returned demonstration, verbal cues required, tactile cues required, and needs further education  HOME EXERCISE PROGRAM:  Access Code: [email or web address] URL: [email or web address] Date: 10/28/2023 Prepared by: Daved Bull  Exercises - Supine Thoracic Mobilization Towel Roll Vertical with Arm Stretch  - 1 x daily - 5 x weekly - 1 sets - 2 minutes hold - Supine Lower Trunk Rotation  - 1 x daily - 5 x weekly - 2 sets - 10 reps - Seated Gastroc Stretch with Strap  - 1 x daily - 7 x weekly - 2 sets - 60 second hold - Standing Gastroc Stretch at Counter  - 2 x daily - 7 x weekly - 1 sets -  3 reps - 30 sec hold - Heel Sits  - 1 x daily - 5 x weekly - 1 sets - 12 reps - Tall Kneeling Diagonal Lift with Medicine Ball  - 1 x daily - 5 x weekly - 1 sets - 5-10 reps - [place]  - 1 x daily - 5 x weekly - 1 sets - 6 reps - Clamshell  - 1 x daily - 5 x weekly - 1-2 sets - 10 reps - Long Sitting Hamstring Stretch  - 1 x daily - 5 x weekly - 1 sets - 2-3 reps - 30-45 seconds hold - Seated Knee Flexion  - 1 x daily - 5 x weekly - 1-2 sets - 4 reps - Seated Hip Abduction with Resistance  - 1 x daily - 5 x weekly - 2 sets - 10 reps - Seated Scapular Retraction  - 1 x daily - 7 x weekly - 3 sets - 10 reps - Single Arm Doorway Pec Stretch at 90 Degrees Abduction  - 1 x daily - 7 x weekly - 1 sets - 3-5 reps - 30 sec each hold  GOALS: Goals reviewed with patient? Yes  SHORT TERM GOALS: Target date: 11/08/2023  PT will review, condense and modify HEP to meet current functional level. Baseline: to be reviewed from previous POC Goal status: Goal met 11-07-23   Patient will obtain cooling vest from [employer] to improve ability to continue activity in warmer months. Baseline: PT will fax request to  [employer] if pt unable to obtain on his own.  Pt has not followed up and we will do this next visit (6/16) Goal status: IN PROGRESS    Hanger rep and PT to further assess and adjust left AFO if able to improve seating into brace for improved utilization. Baseline: PT contacted Olivia at [facility] and followed up, pt wants Orthotist to come to clinic so will likely be later appt (6/16) Goal status: IN PROGRESS   LONG TERM GOALS: Target date: 12/06/2023   Patient will tolerate low to moderate level cardio focused exercise for >/=10 minutes w/ RPE of no more than 5/10 in order to demonstrate tolerance to steady state activity. Baseline: To be introduced. Goal status: INITIAL  Patient will verbalize strategies for energy conservation to improve compliance to safety recommendations. Baseline: To be reviewed. Goal status: INITIAL  Patient will report no right shoulder pain with light activity in order to improve functional use of UE. Baseline: 1-2/10 w/ repetition Goal status: INITIAL  4.  Patient will demonstrate unsupported standing x30 seconds without LOB in order to improve upright safety and functional participation in ADLs. Baseline:  BLE lockout w/ LOB  Goal status:  INITIAL  5.  Patient will demonstrate reach to floor from standing at no more than supervision level to improve functional upright mobility and independence. Baseline:  Cannot reach to floor without BLE collapsing  Goal status:  INITIAL  ASSESSMENT:  CLINICAL IMPRESSION: PT session focused on cardiovascular conditioning exercises and activities.  Pt reported mild fatigue in trunk musculature with performing trunk rotation in seated position with 10# slam ball and also in tall kneeling position holding 2kg medicine ball.  Pt able to take short rest break and continue with exercises.  HR ranged from 62 bpm to 86 bpm in today's session.  Pt is limited in ability to perform activities in standing without UE support due to  decreased standing balance.  Pt tolerated exercises well and reported no major  fatigue at end of session.  Continue POC.    OBJECTIVE IMPAIRMENTS: Abnormal gait, cardiopulmonary status limiting activity, decreased activity tolerance, decreased balance, decreased cognition, decreased coordination, decreased endurance, decreased knowledge of condition, decreased knowledge of use of DME, decreased mobility, difficulty walking, decreased ROM, decreased strength, decreased safety awareness, impaired perceived functional ability, increased muscle spasms, impaired sensation, impaired tone, improper body mechanics, and pain  ACTIVITY LIMITATIONS: carrying, lifting, bending, standing, squatting, stairs, transfers, reach over head, locomotion level, and caring for others  PARTICIPATION LIMITATIONS: cleaning, driving, shopping, community activity, occupation, and yard work  PERSONAL FACTORS: Behavior pattern, Education, Fitness, Past/current experiences, and 1-2 comorbidities: MS and significant cardiac involvement are also affecting patient's functional outcome.   REHAB POTENTIAL: Fair see personal factors and PMH  CLINICAL DECISION MAKING: Unstable/unpredictable  EVALUATION COMPLEXITY: High  PLAN:  PT FREQUENCY: 1-2x/week (2x/wk for initial 4 wks and 1x/wk for last 4 wks)  PT DURATION: 8 weeks  PLANNED INTERVENTIONS: [id number]- PT Re-evaluation, 97750- Physical Performance Testing, 97110-Therapeutic exercises, 97530- Therapeutic activity, [id number]- Neuromuscular re-education, 97535- Self Care, [id number]- Manual therapy, [id number]- Gait training, [phone]- Orthotic Initial, [phone]- Orthotic/Prosthetic subsequent, [phone]- Aquatic Therapy, [phone]- Electrical stimulation (manual), Patient/Family education, Balance training, Stair training, Taping, Dry Needling, Joint mobilization, DME instructions, Wheelchair mobility training, Cryotherapy, and Moist heat  PLAN FOR NEXT SESSION:  Paperwork for cooling vest given to pt on  11-21-23 (informed him that we would help to complete if needed);  cont. With cardio activities/exercises  Reaching to floor level.  Standing balance, AFO fit - Olivia from [place] scheduled to come on 12-09-23 for AFO;  Cardiac focus - modified BORG response and circuit style training.   Roxanna [place], PT  11/22/2023, 3:20 PM

## 2023-11-21 NOTE — Telephone Encounter (Signed)
 Copied from CRM [id number]. Topic: Referral - Question >> Nov 21, 2023  2:47 PM Maisie C wrote: Reason for CRM: Merlynn from [facility] called in to let Johnathan Ross know that they will not be able to see the patient for the issue on his knee. Please advise [phone]

## 2023-11-22 ENCOUNTER — Encounter: Payer: Self-pay | Admitting: Physical Therapy

## 2023-11-22 ENCOUNTER — Encounter: Payer: Self-pay | Admitting: Internal Medicine

## 2023-11-22 NOTE — Telephone Encounter (Signed)
 Unable to reach Joyce LMTRC

## 2023-11-25 ENCOUNTER — Encounter: Payer: Self-pay | Admitting: Physical Therapy

## 2023-11-25 ENCOUNTER — Ambulatory Visit: Admitting: Physical Therapy

## 2023-11-25 DIAGNOSIS — M21372 Foot drop, left foot: Secondary | ICD-10-CM | POA: Diagnosis not present

## 2023-11-25 DIAGNOSIS — R29898 Other symptoms and signs involving the musculoskeletal system: Secondary | ICD-10-CM | POA: Diagnosis not present

## 2023-11-25 DIAGNOSIS — R262 Difficulty in walking, not elsewhere classified: Secondary | ICD-10-CM

## 2023-11-25 DIAGNOSIS — M25511 Pain in right shoulder: Secondary | ICD-10-CM | POA: Diagnosis not present

## 2023-11-25 DIAGNOSIS — M21371 Foot drop, right foot: Secondary | ICD-10-CM

## 2023-11-25 DIAGNOSIS — R29818 Other symptoms and signs involving the nervous system: Secondary | ICD-10-CM | POA: Diagnosis not present

## 2023-11-25 DIAGNOSIS — M6281 Muscle weakness (generalized): Secondary | ICD-10-CM

## 2023-11-25 DIAGNOSIS — R2689 Other abnormalities of gait and mobility: Secondary | ICD-10-CM

## 2023-11-25 DIAGNOSIS — R2681 Unsteadiness on feet: Secondary | ICD-10-CM | POA: Diagnosis not present

## 2023-11-25 NOTE — Therapy (Signed)
 OUTPATIENT PHYSICAL THERAPY NEURO TREATMENT   Patient Name: Johnathan Ross MRN: [id number] DOB:[birth year], 53 y.o., male Today's Date: 11/25/2023   PCP: [name], MD  REFERRING PROVIDER: Pegge Toribio PARAS, PA-C  END OF SESSION:  PT End of Session - 11/25/23 1028     Visit Number 9    Number of Visits 12   12 + eval   Date for PT Re-Evaluation 12/20/23   pushed out due to scheduling delay   Authorization Type [employer]    Authorization Time Period 10-08-23 - 12-20-23    Authorization - Visit Number 8    Authorization - Number of Visits 12    PT Start Time 1020   pt arrived late   PT Stop Time 1100    PT Time Calculation (min) 40 min    Equipment Utilized During Treatment Gait belt    Activity Tolerance Patient tolerated treatment well    Behavior During Therapy [facility] for tasks assessed/performed              Past Medical History:  Diagnosis Date   Hyperlipidemia    Hypertension    Hypogonadism in male 11/01/2016   Hypothyroidism    Multiple sclerosis (HCC)    Neuromuscular disorder (HCC)    Proliferative diabetic retinopathy(362.02)    Type 1 diabetes mellitus (HCC) dx'd 1994   Vitamin D  insufficiency 11/29/2016   Past Surgical History:  Procedure Laterality Date   EYE SURGERY Bilateral    laser for diabetic retinopathy   FRACTURE SURGERY     ICD IMPLANT N/A 08/30/2023   Procedure: ICD IMPLANT;  Surgeon: Inocencio Soyla Lunger, MD;  Location: [facility];  Service: Cardiovascular;  Laterality: N/A;   IR VENO/EXT/UNI LEFT  03/24/2020   OPEN REDUCTION INTERNAL FIXATION (ORIF) TIBIA/FIBULA FRACTURE Right 10/30/2016   ORIF ANKLE FRACTURE Left 2015   ORIF TIBIA FRACTURE Right 10/30/2016   Procedure: OPEN REDUCTION INTERNAL FIXATION (ORIF) TIBIA FIBULA FRACTURE;  Surgeon: Celena Sharper, MD;  Location: [facility] OR;  Service: Orthopedics;  Laterality: Right;   RETINAL LASER PROCEDURE Bilateral    for diabetic retinopathy   RIGHT/LEFT HEART CATH AND  CORONARY ANGIOGRAPHY N/A 08/26/2023   Procedure: RIGHT/LEFT HEART CATH AND CORONARY ANGIOGRAPHY;  Surgeon: Gardenia Led, DO;  Location: [facility];  Service: Cardiovascular;  Laterality: N/A;   Patient Active Problem List   Diagnosis Date Noted   Mass of right lower leg 11/13/2023   Attention and concentration deficit 09/10/2023   Acute encephalopathy 09/03/2023   Cardiac arrest (HCC) 08/26/2023   Encounter for screening for HIV 06/17/2023   Need for immunization against influenza 06/17/2023   Loud snoring 06/17/2023   Encounter for general adult medical examination with abnormal findings 08/02/2022   Screening for prostate cancer 08/01/2021   OAB (overactive bladder) 09/21/2020   Essential hypertension 09/21/2020   Thoracic outlet syndrome 05/03/2020   Controlled type 1 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 (HCC) 03/24/2020   Right epiretinal membrane 03/24/2020   Retinal exudates and deposits 03/24/2020   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   Nuclear sclerotic cataract of both eyes 03/24/2020   Gait disorder 03/23/2020   Benign prostatic hyperplasia without lower urinary tract symptoms 03/01/2020   Diabetic gastroparesis associated with type 1 diabetes mellitus (HCC) 03/01/2020   Gait disturbance 08/19/2019   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/19/2019   Vitamin D  insufficiency 11/29/2016   Hypogonadism in male 11/01/2016   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HCC)  Hypothyroidism    Hypertension    Dyslipidemia, goal LDL below 130    MULTIPLE SCLEROSIS 08/29/2007   PROLIFERATIVE DIABETIC RETINOPATHY 08/29/2007    ONSET DATE: 08/26/2023  REFERRING DIAG: G93.40 (ICD-10-CM) - Acute encephalopathy  THERAPY DIAG:  Other abnormalities of gait and mobility  Muscle weakness (generalized)  Other symptoms and signs involving the nervous system  Unsteadiness on feet  Other symptoms and signs involving the  musculoskeletal system  Difficulty in walking, not elsewhere classified  Foot drop, right  Foot drop, left  Rationale for Evaluation and Treatment: Rehabilitation  SUBJECTIVE:                                                                                                                                                                                             SUBJECTIVE STATEMENT: Pt amb. To PT today with use of RW - using intermittent reciprocal pattern, struggling with this more today as he reports BLE feel heavier today, but he felt pretty good first thing this morning so he is unsure why this is. Pt is working on paperwork for cooling vest - at current needs no help with this.  Denies any acute changes since last visit, ongoing R shoulder pain when he reaches behind his back, otherwise does not have pain.  Pt accompanied by: self and Mom dropped him off  PERTINENT HISTORY: vitamin D  insufficiency, Type 1 IDDM w/ polyneuropathy, diabetic retinopathy, hypothyroidism, HTN, hypercholestrolemia, h/o Rt tibia fracture, h/o Lt ankle fracture with ORIF, MS with acquired diplegia (MS diagnosis 2014/2015)  In the week prior to his cardiac arrest he reported having some near syncopal episodes.  On 3/31 he went into witnessed cardiac arrest w/ bystander performing CPR for 20-25 minutes.  Per EMS notes he had persistent V-fib requiring 8 total defibrillations, 2 doses of epi and some ROSC w/ V tach.  He was also given Narcan in the field due to small pupils.  PAIN:  Are you having pain? No  PRECAUTIONS: Fall and ICD/Pacemaker (Can raise R shoulder as of May 9th - 6wks post placement of ICD; monitor BP - if systolic drops below 100 he is to follow-up with Cardiologist about medication management)  RED FLAGS: None   WEIGHT BEARING RESTRICTIONS: No  FALLS: Has patient fallen in last 6 months? No  LIVING ENVIRONMENT: Lives with: lives with their partner Lives in: House/apartment Stairs:  Yes: External: 5 steps; on right going up, on left going up, and can reach both Has following equipment at home: Single point cane, Walker - 2 wheeled, Wheelchair (manual), and shower chair  PLOF: Requires assistive device for independence and  Needs assistance with homemaking  PATIENT GOALS: fix my brace and if there's anything we can do for my shoulder   OBJECTIVE:  Note: Objective measures were completed at Evaluation unless otherwise noted.  DIAGNOSTIC FINDINGS:  MRI Cardiac 08/29/2023: IMPRESSION: 1. Mild to moderate decrease in left ventricular systolic function (LVEF =40%).   2. There is no late gadolinium enhancement in the left ventricular myocardium. Normal parametric mapping and tissue characterization.   3. Mild decrease in right ventricular systolic function (RVEF =45%).  CT Head 08/26/2023: IMPRESSION: 1. No evidence of acute intracranial abnormality. 2. White matter lesions better characterized on prior MRI.  CT Angio Chest PE 08/26/2023: IMPRESSION: 1. No pulmonary embolism. 2. Moderate left anterior descending coronary artery calcification. 3. Extensive dependent pulmonary consolidation throughout the lungs which may relate to aspiration or multifocal infection.  *Updated imaging ordered by Dr. Vear (MRI of brain and C-spine)   COGNITION: Overall cognitive status: Within functional limits for tasks assessed   SENSATION: Light touch: WFL   MUSCLE TONE: Increased extensor tone of LLE >RLE but ROM and coordination limited bilaterally due to extensor tone/spasticity    POSTURE: rounded shoulders, forward head, and weight shift right  LOWER EXTREMITY ROM:   Limited by extensor tone/spasticity   Active  Right Eval Left Eval  Hip flexion    Hip extension    Hip abduction    Hip adduction    Hip internal rotation    Hip external rotation    Knee flexion    Knee extension    Ankle dorsiflexion    Ankle plantarflexion    Ankle inversion    Ankle  eversion     (Blank rows = not tested)  LOWER EXTREMITY MMT:  Tested in seated position   MMT Right Eval Left Eval  Hip flexion 1 1  Hip extension    Hip abduction 4- 4-  Hip adduction    Hip internal rotation    Hip external rotation    Knee flexion 2 3-  Knee extension 3+ 1  Ankle dorsiflexion AFO donned AFO donned  Ankle plantarflexion    Ankle inversion    Ankle eversion    (Blank rows = not tested)  BED MOBILITY:  Independent per pt - still in a standard bed, needs increased time to roll L and R  TRANSFERS: Assistive device utilized: Environmental consultant - 2 wheeled  Sit to stand: SBA Stand to sit: SBA Heavy reliance on BUEs to essentially throw himself out of chair, wide BOS, better hip extension today when standing to 2WW, when he lets go he maintains anterior lean in attempt to prevent knees locking out, when knees lock out he loses his balance.   GAIT: Gait pattern: step to pattern, decreased step length- Left, decreased stride length, decreased hip/knee flexion- Right, decreased hip/knee flexion- Left, decreased ankle dorsiflexion- Right, decreased ankle dorsiflexion- Left, lateral hip instability, trunk flexed, narrow BOS, and poor foot clearance- Left Distance walked: Various clinic distances  Assistive device utilized: Walker - 2 wheeled Level of assistance: SBA Comments: Pt required 9 minutes to ambulate into treatment room (~30') , frequently stopping to rest as his legs lock up. Noted excessive inversion of L ankle as well as scissoring of LLE. Pt relying heavily on BUEs to perform more of a swing-to pattern.   FUNCTIONAL TESTS:  TREATMENT: 11-25-23   TA:  pt amb. From clinic lobby to 1st mat table in clinic gym with use of RW using a reciprocal gait pattern with standing rest breaks prn  At start of session - after ambulating from clinic  lobby to gym - O2 = 99%, 63 bpm  NMR w/ some focus on CV conditioning via upper body work  Tall kneeling (using sheet for posterior hip support as needed, practiced pt finding balance and allowing hip righting reactions at initial release of bench - used bench for rest and to transition in and out of tall kneel): -2x10 5lb ER  -2x9 lateral 5lb serves -x7 and x10 bilateral hammer curls -5lb tall kneeling deadlift for balance x10 non-continuously SpO2 98%, 66bpm following  Seated on green dynadisc w/ feet on airex: -5lb kettlebell seated deadlift x10 -5lb deadlift into overhead raise x10 -Seated 5lb Y raise for scapular engagement and core x15 SpO2 maintained from prior  PATIENT EDUCATION: Education details: Continue HEP.   Person educated: Patient Education method: Explanation, Demonstration, Tactile cues, Verbal cues, and Handouts Education comprehension: verbalized understanding, returned demonstration, verbal cues required, tactile cues required, and needs further education  HOME EXERCISE PROGRAM:  Access Code: [email or web address] URL: [email or web address] Date: 10/28/2023 Prepared by: Daved Bull  Exercises - Supine Thoracic Mobilization Towel Roll Vertical with Arm Stretch  - 1 x daily - 5 x weekly - 1 sets - 2 minutes hold - Supine Lower Trunk Rotation  - 1 x daily - 5 x weekly - 2 sets - 10 reps - Seated Gastroc Stretch with Strap  - 1 x daily - 7 x weekly - 2 sets - 60 second hold - Standing Gastroc Stretch at Counter  - 2 x daily - 7 x weekly - 1 sets - 3 reps - 30 sec hold - Heel Sits  - 1 x daily - 5 x weekly - 1 sets - 12 reps - Tall Kneeling Diagonal Lift with Medicine Ball  - 1 x daily - 5 x weekly - 1 sets - 5-10 reps - Marching Bridge  - 1 x daily - 5 x weekly - 1 sets - 6 reps - Clamshell  - 1 x daily - 5 x weekly - 1-2 sets - 10 reps - Long Sitting Hamstring Stretch  - 1 x daily - 5 x weekly - 1 sets - 2-3 reps - 30-45 seconds hold - Seated Knee  Flexion  - 1 x daily - 5 x weekly - 1-2 sets - 4 reps - Seated Hip Abduction with Resistance  - 1 x daily - 5 x weekly - 2 sets - 10 reps - Seated Scapular Retraction  - 1 x daily - 7 x weekly - 3 sets - 10 reps - Single Arm Doorway Pec Stretch at 90 Degrees Abduction  - 1 x daily - 7 x weekly - 1 sets - 3-5 reps - 30 sec each hold  HEP updated:  for shoulder strengthening and stretching Access Code: [email or web address] URL: [email or web address] Date: 11/06/2023 Prepared by: Rock Kussmaul  Exercises - Standing Bilateral Shoulder Internal Rotation AAROM with Dowel  - 1 x daily - 7 x weekly - 3 sets - 10 reps - Standing Shoulder Extension with Dowel  - 1 x daily - 7 x weekly - 3 sets - 10 reps - Standing Overhead Shoulder External Rotation Stretch with Towel  - 1 x daily - 7 x weekly - 3 sets - 10 reps - Corner Pec Major Stretch  -  1 x daily - 7 x weekly - 1 sets - 2-3 reps - 15-20 hold - Doorway Pec Stretch at 120 Degrees Abduction  - 1 x daily - 7 x weekly - 1 sets - 2-3 reps - 15 hold  GOALS: Goals reviewed with patient? Yes  SHORT TERM GOALS: Target date: 11/08/2023  PT will review, condense and modify HEP to meet current functional level. Baseline: to be reviewed from previous POC Goal status: Goal met 11-07-23   Patient will obtain cooling vest from [employer] to improve ability to continue activity in warmer months. Baseline: PT will fax request to [employer] if pt unable to obtain on his own.  Pt has not followed up and we will do this next visit (6/16) Goal status: IN PROGRESS    Hanger rep and PT to further assess and adjust left AFO if able to improve seating into brace for improved utilization. Baseline: PT contacted Olivia at [facility] and followed up, pt wants Orthotist to come to clinic so will likely be later appt (6/16) Goal status: IN PROGRESS   LONG TERM GOALS: Target date: 12/06/2023   Patient will tolerate low to moderate level cardio focused exercise for >/=10  minutes w/ RPE of no more than 5/10 in order to demonstrate tolerance to steady state activity. Baseline: To be introduced. Goal status: INITIAL  Patient will verbalize strategies for energy conservation to improve compliance to safety recommendations. Baseline: To be reviewed. Goal status: INITIAL  Patient will report no right shoulder pain with light activity in order to improve functional use of UE. Baseline: 1-2/10 w/ repetition Goal status: INITIAL  4.  Patient will demonstrate unsupported standing x30 seconds without LOB in order to improve upright safety and functional participation in ADLs. Baseline:  BLE lockout w/ LOB  Goal status:  INITIAL  5.  Patient will demonstrate reach to floor from standing at no more than supervision level to improve functional upright mobility and independence. Baseline:  Cannot reach to floor without BLE collapsing  Goal status:  INITIAL  ASSESSMENT:  CLINICAL IMPRESSION: PT session focused on upper body engagement and hip and core righting responses to challenge balance and shoulder protection.  He does well with supported tall kneel needing no more than minA at posterior hips to maintain position for upper body work.  Challenged core with sitting stability tasks today which presented unique challenge with posterior stability as core fatigues quickly with upper body and lifting work.  PT to continue per POC.    OBJECTIVE IMPAIRMENTS: Abnormal gait, cardiopulmonary status limiting activity, decreased activity tolerance, decreased balance, decreased cognition, decreased coordination, decreased endurance, decreased knowledge of condition, decreased knowledge of use of DME, decreased mobility, difficulty walking, decreased ROM, decreased strength, decreased safety awareness, impaired perceived functional ability, increased muscle spasms, impaired sensation, impaired tone, improper body mechanics, and pain  ACTIVITY LIMITATIONS: carrying, lifting,  bending, standing, squatting, stairs, transfers, reach over head, locomotion level, and caring for others  PARTICIPATION LIMITATIONS: cleaning, driving, shopping, community activity, occupation, and yard work  PERSONAL FACTORS: Behavior pattern, Education, Fitness, Past/current experiences, and 1-2 comorbidities: MS and significant cardiac involvement are also affecting patient's functional outcome.   REHAB POTENTIAL: Fair see personal factors and PMH  CLINICAL DECISION MAKING: Unstable/unpredictable  EVALUATION COMPLEXITY: High  PLAN:  PT FREQUENCY: 1-2x/week (2x/wk for initial 4 wks and 1x/wk for last 4 wks)  PT DURATION: 8 weeks  PLANNED INTERVENTIONS: 97164- PT Re-evaluation, 97750- Physical Performance Testing, 97110-Therapeutic exercises, 97530- Therapeutic activity, 97112-  Neuromuscular re-education, [phone]- Self Care, [id number]- Manual therapy, [id number]- Gait training, [phone]- Orthotic Initial, [phone]- Orthotic/Prosthetic subsequent, [phone]- Aquatic Therapy, [phone]- Electrical stimulation (manual), Patient/Family education, Balance training, Stair training, Taping, Dry Needling, Joint mobilization, DME instructions, Wheelchair mobility training, Cryotherapy, and Moist heat  PLAN FOR NEXT SESSION:  Paperwork for cooling vest given to pt on 11-21-23 (informed him that we would help to complete if needed);  cont. With cardio activities/exercises  Reaching to floor level.  Standing balance, AFO fit - Olivia from [facility] scheduled to come on 12-09-23 for AFO - padding on strap as pt reports discomfort;  Cardiac focus - modified BORG response and circuit style training.   Daved KATHEE Bull, PT, DPT  11/25/2023, 11:05 AM

## 2023-11-28 NOTE — Telephone Encounter (Signed)
 Unable to reach Johnathan Ross. LMTRC

## 2023-12-02 ENCOUNTER — Encounter: Payer: Self-pay | Admitting: Physical Therapy

## 2023-12-02 ENCOUNTER — Telehealth (HOSPITAL_COMMUNITY): Payer: Self-pay | Admitting: Cardiology

## 2023-12-02 ENCOUNTER — Other Ambulatory Visit (HOSPITAL_COMMUNITY): Payer: Self-pay | Admitting: General Surgery

## 2023-12-02 ENCOUNTER — Ambulatory Visit: Attending: Physician Assistant | Admitting: Physical Therapy

## 2023-12-02 DIAGNOSIS — R2681 Unsteadiness on feet: Secondary | ICD-10-CM | POA: Insufficient documentation

## 2023-12-02 DIAGNOSIS — M6281 Muscle weakness (generalized): Secondary | ICD-10-CM | POA: Insufficient documentation

## 2023-12-02 DIAGNOSIS — R2689 Other abnormalities of gait and mobility: Secondary | ICD-10-CM | POA: Insufficient documentation

## 2023-12-02 DIAGNOSIS — R262 Difficulty in walking, not elsewhere classified: Secondary | ICD-10-CM | POA: Insufficient documentation

## 2023-12-02 DIAGNOSIS — R29898 Other symptoms and signs involving the musculoskeletal system: Secondary | ICD-10-CM | POA: Insufficient documentation

## 2023-12-02 DIAGNOSIS — R29818 Other symptoms and signs involving the nervous system: Secondary | ICD-10-CM | POA: Diagnosis not present

## 2023-12-02 DIAGNOSIS — M21372 Foot drop, left foot: Secondary | ICD-10-CM | POA: Insufficient documentation

## 2023-12-02 DIAGNOSIS — R2241 Localized swelling, mass and lump, right lower limb: Secondary | ICD-10-CM

## 2023-12-02 DIAGNOSIS — M21371 Foot drop, right foot: Secondary | ICD-10-CM | POA: Diagnosis not present

## 2023-12-02 DIAGNOSIS — M25511 Pain in right shoulder: Secondary | ICD-10-CM | POA: Insufficient documentation

## 2023-12-02 DIAGNOSIS — G8929 Other chronic pain: Secondary | ICD-10-CM | POA: Diagnosis present

## 2023-12-02 NOTE — Progress Notes (Signed)
 [facility] NOTE  Referring Physician: [name], MD  Primary Care: Johnathan Debby CROME, MD Primary Cardiologist: Heart Failure: Johnathan Commander, DO  CC: Heart failure with reduced EF  HPI: Johnathan Ross is a 53 y.o. male with HFrEF, hx of VF arrest, T1DM, hypothyroidism, MS, HLD presenting today to establish care   Pertinent Family & social hx: N/A  Cardiac History:  - Admitted to [facility] in 3/25 VF arrest x 8 defib, TTE LVEF 30-35% - LHC 3/25 with mild non obstructive CAD.  - ICD implanted 3/25 for secondary prevention.    Interval hx:  - Since discharge from the hospital and addition of GDMT he now feels back at his baseline. No significant lightheadedness, LE edema, chest pain, SOB, palpitations. No ICD shocks.   PHYSICAL EXAM: Vitals:   12/03/23 1113  BP: 130/78  Pulse: 63  SpO2: 100%   Lungs- normal work of breathing CARDIAC:  JVP: 7 cm          Normal rate with regular rhythm. no murmur.  Pulses 2+. no edema.  ABDOMEN: soft EXTREMITIES: Warm and well perfused.   DATA REVIEW  ECG: 12/02/23: NSR  As per my personal interpretation  ECHO: 08/26/23: LVEF 30-35%, RV function mildly reduced  CATH: 08/26/23: RA:                  12 mmHg (mean) RV:                  35/10-14 mmHg PA:                  38/20 mmHg (26 mean) PCWP:            14 mmHg (mean)                                      Estimated Fick CO/CI: 4.3 L/min, 1.94 L/min/m2   Thermodilution CO/CI: 4.39 L/min, 1.98 L/min/m2                                           TPG                 12  mmHg                                            PVR                 2.8 Wood Units  PAPi                1.5     LVEDP: 18  CMR:  1. Mild to moderate decrease in left ventricular systolic function (LVEF =40%).  2. There is no late gadolinium enhancement in the left ventricular myocardium. Normal parametric mapping and tissue characterization.  3. Mild decrease in right ventricular systolic  function (RVEF =45%).  ASSESSMENT & PLAN:  Heart Failure with reduced fraction - Etiology & History: nonischemic cardiomyopathy - NYHA Class: NYHA II - Volume status: euvolemic - GDMT:  -  RAASi:Increase losartan  to 50mg  daily Beta-blocker: bisoprolol  5mg   -  Hydralazine /Nitrates: D/C bidil .  -  SGLT2i: contraindicated in the setting of T1DM -  ICD: secondary prevention ICD placed 3/25 - Advanced therapies: not indicated  2. VT/VF arrest - 3/25: 20 minutes of bystander CPR - Remains on driving restrictions.  - seen by EP on 10/15/23, plan on stopping amio in the near future.  - amio 200mg  daily  - LFTs, T4, TSH pending.   3. Multiple sclerosis - stable - followed by neurology  Johnathan Ross Advanced Heart Failure Mechanical Circulatory Support

## 2023-12-02 NOTE — Patient Instructions (Signed)
 Access Code: [email or web address] URL: [email or web address] Date: 12/02/2023 Prepared by: Daved Bull  Exercises - Supine Thoracic Mobilization Towel Roll Vertical with Arm Stretch  - 1 x daily - 5 x weekly - 1 sets - 2 minutes hold - Supine Lower Trunk Rotation  - 1 x daily - 5 x weekly - 2 sets - 10 reps - Seated Gastroc Stretch with Strap  - 1 x daily - 7 x weekly - 2 sets - 60 second hold - Standing Gastroc Stretch at Counter  - 2 x daily - 7 x weekly - 1 sets - 3 reps - 30 sec hold - Heel Sits  - 1 x daily - 5 x weekly - 1 sets - 12 reps - Tall Kneeling Diagonal Lift with Medicine Ball  - 1 x daily - 5 x weekly - 1 sets - 5-10 reps - Marching Bridge  - 1 x daily - 5 x weekly - 1 sets - 6 reps - Clamshell  - 1 x daily - 5 x weekly - 1-2 sets - 10 reps - Long Sitting Hamstring Stretch  - 1 x daily - 5 x weekly - 1 sets - 2-3 reps - 30-45 seconds hold - Seated Knee Flexion  - 1 x daily - 5 x weekly - 1-2 sets - 4 reps - Seated Hip Abduction with Resistance  - 1 x daily - 5 x weekly - 2 sets - 10 reps - Seated Scapular Retraction  - 1 x daily - 7 x weekly - 3 sets - 10 reps - Single Arm Doorway Pec Stretch at 90 Degrees Abduction  - 1 x daily - 7 x weekly - 1 sets - 3-5 reps - 30 sec each hold - Open Book Chest Stretch on Towel Roll  - 1 x daily - 7 x weekly - 1 sets - 10 reps - Supine Shoulder External Rotation Stretch  - 1 x daily - 7 x weekly - 1-2 sets - 10 reps - Supine Shoulder Internal Rotation Stretch  - 1 x daily - 7 x weekly - 1-2 sets - 10 reps

## 2023-12-02 NOTE — Telephone Encounter (Signed)
 Called to confirm/remind patient of their appointment at the [facility] on 12/02/2023.   Appointment:   [] Confirmed  [x] Left mess   [] No answer/No voice mail  [] VM Full/unable to leave message  [] Phone not in service  Patient reminded to bring all medications and/or complete list.  Confirmed patient has transportation. Gave directions, instructed to utilize valet parking.

## 2023-12-02 NOTE — Therapy (Signed)
 OUTPATIENT PHYSICAL THERAPY NEURO TREATMENT   Patient Name: Johnathan Ross MRN: [id number] DOB:08-Sep-1970, 53 y.o., male Today's Date: 12/02/2023   PCP: [name], MD  REFERRING PROVIDER: Pegge Toribio PARAS, PA-C  END OF SESSION:  PT End of Session - 12/02/23 0941     Visit Number 10    Number of Visits 12   12 + eval   Date for PT Re-Evaluation 12/20/23   pushed out due to scheduling delay   Authorization Type [employer]    Authorization Time Period 10-08-23 - 12-20-23    Authorization - Visit Number 9    Authorization - Number of Visits 12    PT Start Time 0934    PT Stop Time 1018    PT Time Calculation (min) 44 min    Equipment Utilized During Treatment Gait belt    Activity Tolerance Patient tolerated treatment well    Behavior During Therapy [facility] for tasks assessed/performed              Past Medical History:  Diagnosis Date   Hyperlipidemia    Hypertension    Hypogonadism in male 11/01/2016   Hypothyroidism    Multiple sclerosis (HCC)    Neuromuscular disorder (HCC)    Proliferative diabetic retinopathy(362.02)    Type 1 diabetes mellitus (HCC) dx'd 1994   Vitamin D  insufficiency 11/29/2016   Past Surgical History:  Procedure Laterality Date   EYE SURGERY Bilateral    laser for diabetic retinopathy   FRACTURE SURGERY     ICD IMPLANT N/A 08/30/2023   Procedure: ICD IMPLANT;  Surgeon: Inocencio Soyla Lunger, MD;  Location: [facility] INVASIVE CV [facility];  Service: Cardiovascular;  Laterality: N/A;   IR VENO/EXT/UNI LEFT  03/24/2020   OPEN REDUCTION INTERNAL FIXATION (ORIF) TIBIA/FIBULA FRACTURE Right 10/30/2016   ORIF ANKLE FRACTURE Left 2015   ORIF TIBIA FRACTURE Right 10/30/2016   Procedure: OPEN REDUCTION INTERNAL FIXATION (ORIF) TIBIA FIBULA FRACTURE;  Surgeon: Celena Sharper, MD;  Location: [facility] OR;  Service: Orthopedics;  Laterality: Right;   RETINAL LASER PROCEDURE Bilateral    for diabetic retinopathy   RIGHT/LEFT HEART CATH AND CORONARY ANGIOGRAPHY  N/A 08/26/2023   Procedure: RIGHT/LEFT HEART CATH AND CORONARY ANGIOGRAPHY;  Surgeon: Gardenia Led, DO;  Location: [facility] INVAS[facility] CV [facility];  Service: Cardiovascular;  Laterality: N/A;   Patient Active Problem List   Diagnosis Date Noted   Mass of right lower leg 11/13/2023   Attention and concentration deficit 09/10/2023   Acute encephalopathy 09/03/2023   Cardiac arrest (HCC) 08/26/2023   Encounter for screening for HIV 06/17/2023   Need for immunization against influenza 06/17/2023   Loud snoring 06/17/2023   Encounter for general adult medical examination with abnormal findings 08/02/2022   Screening for prostate cancer 08/01/2021   OAB (overactive bladder) 09/21/2020   Essential hypertension 09/21/2020   Thoracic outlet syndrome 05/03/2020   Controlled type 1 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 (HCC) 03/24/2020   Right epiretinal membrane 03/24/2020   Retinal exudates and deposits 03/24/2020   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   Nuclear sclerotic cataract of both eyes 03/24/2020   Gait disorder 03/23/2020   Benign prostatic hyperplasia without lower urinary tract symptoms 03/01/2020   Diabetic gastroparesis associated with type 1 diabetes mellitus (HCC) 03/01/2020   Gait disturbance 08/19/2019   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/19/2019   Vitamin D  insufficiency 11/29/2016   Hypogonadism in male 11/01/2016   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HCC)    Hypothyroidism  Hypertension    Dyslipidemia, goal LDL below 130    MULTIPLE SCLEROSIS 08/29/2007   PROLIFERATIVE DIABETIC RETINOPATHY 08/29/2007    ONSET DATE: 08/26/2023  REFERRING DIAG: G93.40 (ICD-10-CM) - Acute encephalopathy  THERAPY DIAG:  Other abnormalities of gait and mobility  Muscle weakness (generalized)  Other symptoms and signs involving the nervous system  Unsteadiness on feet  Other symptoms and signs involving the musculoskeletal  system  Difficulty in walking, not elsewhere classified  Foot drop, right  Foot drop, left  Chronic right shoulder pain  Rationale for Evaluation and Treatment: Rehabilitation  SUBJECTIVE:                                                                                                                                                                                             SUBJECTIVE STATEMENT: Pt amb. To PT today with use of RW - using intermittent reciprocal pattern, doing better with this today than last visit though less continuous than his best day.  Both shoulders have been more stiff and sore the last 2 days to the point he had to have his gf move his insulin  pump because he couldn't reach it.  He thought Orthotist was coming to visit today. Pt is still working on paperwork for cooling vest and plans to mail it off once he has everything.  Pt accompanied by: self and gf (present during session)  PERTINENT HISTORY: vitamin D  insufficiency, Type 1 IDDM w/ polyneuropathy, diabetic retinopathy, hypothyroidism, HTN, hypercholestrolemia, h/o Rt tibia fracture, h/o Lt ankle fracture with ORIF, MS with acquired diplegia (MS diagnosis 2014/2015)  In the week prior to his cardiac arrest he reported having some near syncopal episodes.  On 3/31 he went into witnessed cardiac arrest w/ bystander performing CPR for 20-25 minutes.  Per EMS notes he had persistent V-fib requiring 8 total defibrillations, 2 doses of epi and some ROSC w/ V tach.  He was also given Narcan in the field due to small pupils.  PAIN:  Are you having pain? No - pain and stiffness in bilateral shoulders w/ IR  PRECAUTIONS: Fall and ICD/Pacemaker (Can raise R shoulder as of May 9th - 6wks post placement of ICD; monitor BP - if systolic drops below 100 he is to follow-up with Cardiologist about medication management)  RED FLAGS: None   WEIGHT BEARING RESTRICTIONS: No  FALLS: Has patient fallen in last 6 months?  No  LIVING ENVIRONMENT: Lives with: lives with their partner Lives in: House/apartment Stairs: Yes: External: 5 steps; on right going up, on left going up, and can reach both Has following equipment at home: Single point cane,  Walker - 2 wheeled, Wheelchair (manual), and shower chair  PLOF: Requires assistive device for independence and Needs assistance with homemaking  PATIENT GOALS: fix my brace and if there's anything we can do for my shoulder   OBJECTIVE:  Note: Objective measures were completed at Evaluation unless otherwise noted.  DIAGNOSTIC FINDINGS:  MRI Cardiac 08/29/2023: IMPRESSION: 1. Mild to moderate decrease in left ventricular systolic function (LVEF =40%).   2. There is no late gadolinium enhancement in the left ventricular myocardium. Normal parametric mapping and tissue characterization.   3. Mild decrease in right ventricular systolic function (RVEF =45%).  CT Head 08/26/2023: IMPRESSION: 1. No evidence of acute intracranial abnormality. 2. White matter lesions better characterized on prior MRI.  CT Angio Chest PE 08/26/2023: IMPRESSION: 1. No pulmonary embolism. 2. Moderate left anterior descending coronary artery calcification. 3. Extensive dependent pulmonary consolidation throughout the lungs which may relate to aspiration or multifocal infection.  *Updated imaging ordered by Dr. Vear (MRI of brain and C-spine)   COGNITION: Overall cognitive status: Within functional limits for tasks assessed   SENSATION: Light touch: WFL   MUSCLE TONE: Increased extensor tone of LLE >RLE but ROM and coordination limited bilaterally due to extensor tone/spasticity    POSTURE: rounded shoulders, forward head, and weight shift right  LOWER EXTREMITY ROM:   Limited by extensor tone/spasticity   Active  Right Eval Left Eval  Hip flexion    Hip extension    Hip abduction    Hip adduction    Hip internal rotation    Hip external rotation    Knee flexion     Knee extension    Ankle dorsiflexion    Ankle plantarflexion    Ankle inversion    Ankle eversion     (Blank rows = not tested)  LOWER EXTREMITY MMT:  Tested in seated position   MMT Right Eval Left Eval  Hip flexion 1 1  Hip extension    Hip abduction 4- 4-  Hip adduction    Hip internal rotation    Hip external rotation    Knee flexion 2 3-  Knee extension 3+ 1  Ankle dorsiflexion AFO donned AFO donned  Ankle plantarflexion    Ankle inversion    Ankle eversion    (Blank rows = not tested)  BED MOBILITY:  Independent per pt - still in a standard bed, needs increased time to roll L and R  TRANSFERS: Assistive device utilized: Environmental consultant - 2 wheeled  Sit to stand: SBA Stand to sit: SBA Heavy reliance on BUEs to essentially throw himself out of chair, wide BOS, better hip extension today when standing to 2WW, when he lets go he maintains anterior lean in attempt to prevent knees locking out, when knees lock out he loses his balance.   GAIT: Gait pattern: step to pattern, decreased step length- Left, decreased stride length, decreased hip/knee flexion- Right, decreased hip/knee flexion- Left, decreased ankle dorsiflexion- Right, decreased ankle dorsiflexion- Left, lateral hip instability, trunk flexed, narrow BOS, and poor foot clearance- Left Distance walked: Various clinic distances  Assistive device utilized: Walker - 2 wheeled Level of assistance: SBA Comments: Pt required 9 minutes to ambulate into treatment room (~30') , frequently stopping to rest as his legs lock up. Noted excessive inversion of L ankle as well as scissoring of LLE. Pt relying heavily on BUEs to perform more of a swing-to pattern.   FUNCTIONAL TESTS:  TREATMENT: 12/02/23  TA:   pt amb. From clinic lobby to 2nd mat table in clinic gym with use of RW using a reciprocal  gait pattern with standing rest breaks prn  R shoulder flexion:  91 deg R shoulder abduction 79 deg; pain at end range L shoulder flexion:  94 deg; pain at end range L shoulder abduction:  82 deg  Arm bike at level 5.0 x4 minutes forwards then backwards w/ pinching in anterior right shoulder at top of motion regardless of direction   TE: -Pt laying over vertical towel for thoracic mobilization PT assessed right subacromial space w/o TTP > he was tender at inferior most pec insertion especially during overhead elevation attempt past 90 deg. -Supine pec stretch bilaterally x2 minutes each > discussed not pushing stretch beyond comfort and using bolster for correct shoulder positioning -90-90 supine ER/IR AROM several reps each side  PATIENT EDUCATION: Education details: Continue HEP.  Discussed concerns for shoulder overuse injuries and measuring ROM today to ensure he remains in functional ROM for 2WW use and other frequent daily tasks vs maybe needing to follow-up with PCP about shoulder MRI.  Discussed ongoing goal of protecting RTC as much as possible and how this may be contributing to his IR stiffness and pain from overuse.  Encouraged alternating ice/heat/not forcing stretching or strengthening when shoulders feeling irritated.  Wear loose clothing so orthotist can access orthotic next visit for best assessment. Person educated: Patient Education method: Explanation, Demonstration, Tactile cues, Verbal cues, and Handouts Education comprehension: verbalized understanding, returned demonstration, verbal cues required, tactile cues required, and needs further education  HOME EXERCISE PROGRAM:  Access Code: [email or web address] URL: [email or web address] Date: 10/28/2023 Prepared by: Daved Bull  Exercises - Supine Thoracic Mobilization Towel Roll Vertical with Arm Stretch  - 1 x daily - 5 x weekly - 1 sets - 2 minutes hold - Supine Lower Trunk Rotation  - 1 x daily - 5 x  weekly - 2 sets - 10 reps - Seated Gastroc Stretch with Strap  - 1 x daily - 7 x weekly - 2 sets - 60 second hold - Standing Gastroc Stretch at Counter  - 2 x daily - 7 x weekly - 1 sets - 3 reps - 30 sec hold - Heel Sits  - 1 x daily - 5 x weekly - 1 sets - 12 reps - Tall Kneeling Diagonal Lift with Medicine Ball  - 1 x daily - 5 x weekly - 1 sets - 5-10 reps - Marching Bridge  - 1 x daily - 5 x weekly - 1 sets - 6 reps - Clamshell  - 1 x daily - 5 x weekly - 1-2 sets - 10 reps - Long Sitting Hamstring Stretch  - 1 x daily - 5 x weekly - 1 sets - 2-3 reps - 30-45 seconds hold - Seated Knee Flexion  - 1 x daily - 5 x weekly - 1-2 sets - 4 reps - Seated Hip Abduction with Resistance  - 1 x daily - 5 x weekly - 2 sets - 10 reps - Seated Scapular Retraction  - 1 x daily - 7 x weekly - 3 sets - 10 reps - Single Arm Doorway Pec Stretch at 90 Degrees Abduction  - 1 x daily - 7 x weekly - 1 sets - 3-5 reps - 30 sec each hold - Open Book Chest Stretch on Towel Roll  - 1 x daily - 7 x weekly - 1 sets - 10 reps -  Supine Shoulder External Rotation Stretch  - 1 x daily - 7 x weekly - 1-2 sets - 10 reps - Supine Shoulder Internal Rotation Stretch  - 1 x daily - 7 x weekly - 1-2 sets - 10 reps  HEP updated:  for shoulder strengthening and stretching Access Code: [email or web address] URL: [email or web address] Date: 11/06/2023 Prepared by: Rock Kussmaul  Exercises - Standing Bilateral Shoulder Internal Rotation AAROM with Dowel  - 1 x daily - 7 x weekly - 3 sets - 10 reps - Standing Shoulder Extension with Dowel  - 1 x daily - 7 x weekly - 3 sets - 10 reps - Standing Overhead Shoulder External Rotation Stretch with Towel  - 1 x daily - 7 x weekly - 3 sets - 10 reps - Corner Pec Major Stretch  - 1 x daily - 7 x weekly - 1 sets - 2-3 reps - 15-20 hold - Doorway Pec Stretch at 120 Degrees Abduction  - 1 x daily - 7 x weekly - 1 sets - 2-3 reps - 15 hold  GOALS: Goals reviewed with patient? Yes  SHORT  TERM GOALS: Target date: 11/08/2023  PT will review, condense and modify HEP to meet current functional level. Baseline: to be reviewed from previous POC Goal status: Goal met 11-07-23   Patient will obtain cooling vest from [employer] to improve ability to continue activity in warmer months. Baseline: PT will fax request to [employer] if pt unable to obtain on his own.  Pt has not followed up and we will do this next visit (6/16) Goal status: IN PROGRESS    Hanger rep and PT to further assess and adjust left AFO if able to improve seating into brace for improved utilization. Baseline: PT contacted Olivia at [facility] and followed up, pt wants Orthotist to come to clinic so will likely be later appt (6/16) Goal status: IN PROGRESS   LONG TERM GOALS: Target date: 12/06/2023   Patient will tolerate low to moderate level cardio focused exercise for >/=10 minutes w/ RPE of no more than 5/10 in order to demonstrate tolerance to steady state activity. Baseline: To be introduced. Goal status: INITIAL  Patient will verbalize strategies for energy conservation to improve compliance to safety recommendations. Baseline: To be reviewed. Goal status: INITIAL  Patient will report no right shoulder pain with light activity in order to improve functional use of UE. Baseline: 1-2/10 w/ repetition Goal status: INITIAL  4.  Patient will demonstrate unsupported standing x30 seconds without LOB in order to improve upright safety and functional participation in ADLs. Baseline:  BLE lockout w/ LOB  Goal status:  INITIAL  5.  Patient will demonstrate reach to floor from standing at no more than supervision level to improve functional upright mobility and independence. Baseline:  Cannot reach to floor without BLE collapsing  Goal status:  INITIAL  ASSESSMENT:  CLINICAL IMPRESSION: PT session focused on assessing shoulder mobility in setting of recent bilateral acute increase in pain with mobility and  strengthening and stretching for shoulder protection.  He was only TTP at inferior pectoralis insertion when at max abduction range.  Made minor additions to his HEP to reflect stretching work completed this visit.  Will continue to address pain and mobility to maintain ability to rely on BUE for functional ambulation.  PT to continue per POC.  OBJECTIVE IMPAIRMENTS: Abnormal gait, cardiopulmonary status limiting activity, decreased activity tolerance, decreased balance, decreased cognition, decreased coordination, decreased endurance, decreased knowledge of condition, decreased knowledge of  use of DME, decreased mobility, difficulty walking, decreased ROM, decreased strength, decreased safety awareness, impaired perceived functional ability, increased muscle spasms, impaired sensation, impaired tone, improper body mechanics, and pain  ACTIVITY LIMITATIONS: carrying, lifting, bending, standing, squatting, stairs, transfers, reach over head, locomotion level, and caring for others  PARTICIPATION LIMITATIONS: cleaning, driving, shopping, community activity, occupation, and yard work  PERSONAL FACTORS: Behavior pattern, Education, Fitness, Past/current experiences, and 1-2 comorbidities: MS and significant cardiac involvement are also affecting patient's functional outcome.   REHAB POTENTIAL: Fair see personal factors and PMH  CLINICAL DECISION MAKING: Unstable/unpredictable  EVALUATION COMPLEXITY: High  PLAN:  PT FREQUENCY: 1-2x/week (2x/wk for initial 4 wks and 1x/wk for last 4 wks)  PT DURATION: 8 weeks  PLANNED INTERVENTIONS: [id number]- PT Re-evaluation, 97750- Physical Performance Testing, 97110-Therapeutic exercises, 97530- Therapeutic activity, [id number]- Neuromuscular re-education, 97535- Self Care, [id number]- Manual therapy, [id number]- Gait training, [phone]- Orthotic Initial, [phone]- Orthotic/Prosthetic subsequent, [phone]- Aquatic Therapy, [phone]- Electrical stimulation (manual), Patient/Family education,  Balance training, Stair training, Taping, Dry Needling, Joint mobilization, DME instructions, Wheelchair mobility training, Cryotherapy, and Moist heat  PLAN FOR NEXT SESSION:  Paperwork for cooling vest given to pt on 11-21-23 (informed him that we would help to complete if needed);  cont. With cardio activities/exercises  Reaching to floor level.  Standing balance, AFO fit - Olivia from [place] scheduled to come on 12-09-23 for AFO - padding on strap as pt reports discomfort;  Cardiac focus - modified BORG response and circuit style training.   Daved KATHEE Bull, PT, DPT  12/02/2023, 10:22 AM

## 2023-12-03 ENCOUNTER — Encounter (HOSPITAL_COMMUNITY): Payer: Self-pay | Admitting: Cardiology

## 2023-12-03 ENCOUNTER — Ambulatory Visit (HOSPITAL_BASED_OUTPATIENT_CLINIC_OR_DEPARTMENT_OTHER)
Admission: RE | Admit: 2023-12-03 | Discharge: 2023-12-03 | Disposition: A | Discharge status: Home / Self Care | Source: Ambulatory Visit | Attending: Cardiology | Admitting: Cardiology

## 2023-12-03 ENCOUNTER — Ambulatory Visit (HOSPITAL_COMMUNITY)
Admission: RE | Admit: 2023-12-03 | Discharge: 2023-12-03 | Disposition: A | Discharge status: Home / Self Care | Source: Ambulatory Visit | Attending: Cardiology | Admitting: Cardiology

## 2023-12-03 ENCOUNTER — Telehealth: Payer: Self-pay

## 2023-12-03 VITALS — BP 130/78 | HR 63 | Wt 212.0 lb

## 2023-12-03 DIAGNOSIS — Z79899 Other long term (current) drug therapy: Secondary | ICD-10-CM | POA: Insufficient documentation

## 2023-12-03 DIAGNOSIS — I472 Ventricular tachycardia, unspecified: Secondary | ICD-10-CM | POA: Diagnosis not present

## 2023-12-03 DIAGNOSIS — Z95810 Presence of automatic (implantable) cardiac defibrillator: Secondary | ICD-10-CM

## 2023-12-03 DIAGNOSIS — E039 Hypothyroidism, unspecified: Secondary | ICD-10-CM | POA: Diagnosis not present

## 2023-12-03 DIAGNOSIS — Z8674 Personal history of sudden cardiac arrest: Secondary | ICD-10-CM | POA: Insufficient documentation

## 2023-12-03 DIAGNOSIS — I5022 Chronic systolic (congestive) heart failure: Secondary | ICD-10-CM

## 2023-12-03 DIAGNOSIS — E109 Type 1 diabetes mellitus without complications: Secondary | ICD-10-CM | POA: Diagnosis not present

## 2023-12-03 DIAGNOSIS — Z006 Encounter for examination for normal comparison and control in clinical research program: Secondary | ICD-10-CM

## 2023-12-03 DIAGNOSIS — I4901 Ventricular fibrillation: Secondary | ICD-10-CM | POA: Diagnosis not present

## 2023-12-03 DIAGNOSIS — G35 Multiple sclerosis: Secondary | ICD-10-CM | POA: Diagnosis not present

## 2023-12-03 DIAGNOSIS — I428 Other cardiomyopathies: Secondary | ICD-10-CM | POA: Insufficient documentation

## 2023-12-03 DIAGNOSIS — R5381 Other malaise: Secondary | ICD-10-CM | POA: Diagnosis not present

## 2023-12-03 DIAGNOSIS — Z794 Long term (current) use of insulin: Secondary | ICD-10-CM | POA: Diagnosis not present

## 2023-12-03 DIAGNOSIS — I469 Cardiac arrest, cause unspecified: Secondary | ICD-10-CM | POA: Diagnosis not present

## 2023-12-03 DIAGNOSIS — E785 Hyperlipidemia, unspecified: Secondary | ICD-10-CM | POA: Insufficient documentation

## 2023-12-03 DIAGNOSIS — I2510 Atherosclerotic heart disease of native coronary artery without angina pectoris: Secondary | ICD-10-CM | POA: Diagnosis not present

## 2023-12-03 LAB — BASIC METABOLIC PANEL WITH GFR
Anion gap: 9 (ref 5–15)
BUN: 15 mg/dL (ref 6–20)
CO2: 27 mmol/L (ref 22–32)
Calcium: 9.6 mg/dL (ref 8.9–10.3)
Chloride: 105 mmol/L (ref 98–111)
Creatinine, Ser: 1.36 mg/dL — ABNORMAL HIGH (ref 0.61–1.24)
GFR, Estimated: >60 mL/min (ref >60)
Glucose, Bld: 122 mg/dL — ABNORMAL HIGH (ref 70–99)
Potassium: 5.8 mmol/L — ABNORMAL HIGH (ref 3.5–5.1)
Sodium: 141 mmol/L (ref 135–145)

## 2023-12-03 LAB — ECHOCARDIOGRAM COMPLETE
AR max vel: 2.87 cm2
AV Area VTI: 2.76 cm2
AV Area mean vel: 2.65 cm2
AV Mean grad: 2 mmHg
AV Peak grad: 3.9 mmHg
Ao pk vel: 0.99 m/s
Area-P 1/2: 3.91 cm2
Calc EF: 53.9 %
S' Lateral: 3.8 cm
Single Plane A2C EF: 52.6 %
Single Plane A4C EF: 54.3 %

## 2023-12-03 LAB — BRAIN NATRIURETIC PEPTIDE: B Natriuretic Peptide: 170.7 pg/mL — ABNORMAL HIGH (ref 0.0–100.0)

## 2023-12-03 MED ORDER — LOSARTAN POTASSIUM 50 MG PO TABS: 50.0000 mg | ORAL_TABLET | Freq: Every day | ORAL | 5 refills | Status: DC

## 2023-12-03 MED ORDER — PERFLUTREN LIPID MICROSPHERE
1.0000 mL | INTRAVENOUS | Status: DC | PRN
  Administered 2023-12-03: 5 mL via INTRAVENOUS

## 2023-12-03 NOTE — Telephone Encounter (Signed)
 Copied from CRM [id number]. Topic: General - Other >> Dec 03, 2023 10:43 AM Thersia BROCKS wrote: Reason for CRM: Merlynn from [facility] is calling to speak with [facility], would like a callback  [phone]

## 2023-12-03 NOTE — Patient Instructions (Signed)
 Medication Changes:  STOP BIDIL    INCREASE LOSARTAN  TO 50MG  ONCE DAILY   Lab Work:  Labs done today, your results will be available in MyChart, we will contact you for abnormal readings.  THEN LABS AGAIN IN 10 DAYS AS SCHEDULED   Follow-Up in: 2 MONTHS AS SCHEDULED WITH NP/PA  At the [facility], you and your health needs are our priority. We have a designated team specialized in the treatment of Heart Failure. This Care Team includes your primary Heart Failure Specialized Cardiologist (physician), Advanced Practice Providers (APPs- Physician Assistants and Nurse Practitioners), and Pharmacist who all work together to provide you with the care you need, when you need it.   You may see any of the following providers on your designated Care Team at your next follow up:  Dr. Toribio Fuel Dr. Ezra Shuck Dr. Ria Commander Dr. Odis Brownie Greig Mosses, NP Caffie Shed, [place] [facility] [place], [place] Beckey Coe, NP [place] Lee, NP Tinnie Redman, PharmD   Please be sure to bring in all your medications bottles to every appointment.   Need to Contact Us :  If you have any questions or concerns before your next appointment please send us  a message through [place] or call our office at [phone].    TO LEAVE A MESSAGE FOR THE NURSE SELECT OPTION 2, PLEASE LEAVE A MESSAGE INCLUDING: YOUR NAME DATE OF BIRTH CALL BACK NUMBER REASON FOR CALL**this is important as we prioritize the call backs  YOU WILL RECEIVE A CALL BACK THE SAME DAY AS LONG AS YOU CALL BEFORE 4:00 PM

## 2023-12-03 NOTE — Research (Signed)
 SITE: [id number]     Subject # [id number]   Subprotocol: A  Inclusion Criteria  Patients who meet all of the following criteria are eligible for enrollment as study participants:  Yes No  Age > 51 years old X   Eligible to wear Holter Study X    Exclusion Criteria  Patients who meet any of these criteria are not eligible for enrollment as study participants: Yes No  1. Receiving any mechanical (respiratory or circulatory) or renal support therapy at Screening or during Visit #1.  X  2.  Any other conditions that in the opinion of the investigators are likely to prevent compliance with the study protocol or pose a safety concern if the subject participates in the study.  X  3. Poor tolerance, namely susceptible to severe skin allergies from ECG adhesive patch application.  X   Protocol: REV H    60 minute start window         Cor device must be applied, and the study initiated, no later than 60 minutes of completing the Echocardiogram                             HH:MM  Echo completion time  10:52  2.   Cor Study start time  11:11   30-Minute execution window  Once Cor Monitoring begins, 3 QT Med ECGs and the 15-minute rest period must be completed within a 30 minute window     HH:MM  3. QT Med ECG Completion time  11:35  4. Start of 15-Min sitting rest period  11:16  5. End of 15-Min rest period  11:31  6. Time of device removal  11:41   *Continue to use the Mobile App Event feature to log the Rest period windows and follow instructions on the EF-ACT Clinical Trial  Patient Instruction Card.  Describe any anomalies in Protocol execution in the Protocol Deviation Log    Residential Zip code [address] (First 3 digits ONLY)                                           [facility] Informed Consent   Subject Name: Johnathan Ross  Subject met inclusion and exclusion criteria.  The informed consent form, study requirements and expectations were reviewed with the subject. Subject had opportunity to  read consent and questions and concerns were addressed prior to the signing of the consent form.  The subject verbalized understanding of the trial requirements.  The subject agreed to participate in the [facility] EF ACT trial and signed the informed consent at 10:55 on 03-Dec-2023.  The informed consent was obtained prior to performance of any protocol-specific procedures for the subject.  A copy of the signed informed consent was given to the subject and a copy was placed in the subject's medical record.   Johnathan Ross          Current Outpatient Medications:    acetaminophen  (TYLENOL ) 325 MG tablet, Take 1-2 tablets (325-650 mg total) by mouth every 4 (four) hours as needed for mild pain (pain score 1-3)., Disp: , Rfl:    amiodarone  (PACERONE ) 200 MG tablet, Take 1 tablet (200 mg total) by mouth daily., Disp: 90 tablet, Rfl: 0   aspirin  EC 81 MG tablet, Take 1 tablet (81 mg total) by mouth daily. Swallow whole., Disp: ,  Rfl:    bisoprolol  (ZEBETA ) 5 MG tablet, Take 1 tablet (5 mg total) by mouth daily., Disp: 30 tablet, Rfl: 6   dalfampridine  10 MG TB12, Take 1 tablet (10 mg total) by mouth 2 (two) times daily., Disp: 180 tablet, Rfl: 3   furosemide  (LASIX ) 20 MG tablet, TAKE 1 TABLET AS NEEDED FOR FLUID OR EDEMA. FOR WEIGHT GAIN OF 3LB IN 24 HOURS OR 5 LB IN A WEEK, Disp: 90 tablet, Rfl: 1   Insulin  Human (INSULIN  PUMP) SOLN, Inject 1 each into the skin 3 times daily with meals, bedtime and 2 AM., Disp: , Rfl:    levocetirizine (XYZAL ) 5 MG tablet, TAKE 1 TABLET BY MOUTH EVERY DAY IN THE EVENING, Disp: 90 tablet, Rfl: 1   levothyroxine  (SYNTHROID ) 125 MCG tablet, Take 1 tablet (125 mcg total) by mouth daily at 6 (six) AM., Disp: 90 tablet, Rfl: 0   losartan  (COZAAR ) 50 MG tablet, Take 1 tablet (50 mg total) by mouth daily., Disp: 30 tablet, Rfl: 5   rOPINIRole  (REQUIP ) 0.25 MG tablet, Take 1 tablet (0.25 mg total) by mouth at bedtime., Disp: 90 tablet, Rfl: 0   simvastatin  (ZOCOR ) 20 MG  tablet, Take 1 tablet (20 mg total) by mouth daily at 6 PM., Disp: 90 tablet, Rfl: 0   vitamin D3 (CHOLECALCIFEROL ) 25 MCG tablet, Take 1 tablet (1,000 Units total) by mouth daily. (Patient not taking: Reported on 12/03/2023), Disp: 30 tablet, Rfl: 0 No current facility-administered medications for this visit.  Facility-Administered Medications Ordered in Other Visits:    perflutren  lipid microspheres (DEFINITY ) IV suspension, 1-10 mL, Intravenous, PRN, Court Dorn PARAS, MD, 5 mL at 12/03/23 1052

## 2023-12-04 ENCOUNTER — Other Ambulatory Visit: Payer: Self-pay

## 2023-12-04 ENCOUNTER — Other Ambulatory Visit: Payer: Self-pay | Admitting: Endocrinology

## 2023-12-04 ENCOUNTER — Telehealth: Payer: Self-pay

## 2023-12-04 ENCOUNTER — Encounter: Payer: Self-pay | Admitting: Pulmonary Disease

## 2023-12-04 ENCOUNTER — Ambulatory Visit: Attending: Pulmonary Disease | Admitting: Pulmonary Disease

## 2023-12-04 ENCOUNTER — Ambulatory Visit (HOSPITAL_COMMUNITY): Payer: Self-pay | Admitting: Cardiology

## 2023-12-04 VITALS — BP 138/78 | HR 60 | Ht 73.0 in | Wt 212.0 lb

## 2023-12-04 DIAGNOSIS — I469 Cardiac arrest, cause unspecified: Secondary | ICD-10-CM | POA: Diagnosis not present

## 2023-12-04 DIAGNOSIS — Z95810 Presence of automatic (implantable) cardiac defibrillator: Secondary | ICD-10-CM | POA: Diagnosis not present

## 2023-12-04 DIAGNOSIS — E1065 Type 1 diabetes mellitus with hyperglycemia: Secondary | ICD-10-CM

## 2023-12-04 DIAGNOSIS — I5022 Chronic systolic (congestive) heart failure: Secondary | ICD-10-CM

## 2023-12-04 DIAGNOSIS — Z79899 Other long term (current) drug therapy: Secondary | ICD-10-CM

## 2023-12-04 LAB — CUP PACEART INCLINIC DEVICE CHECK
Battery Remaining Longevity: 121 mo
Brady Statistic RV Percent Paced: 0 %
Date Time Interrogation Session: 20250709174517
HighPow Impedance: 64.125
Implantable Lead Connection Status: 753985
Implantable Lead Implant Date: 20250404
Implantable Lead Location: 753860
Implantable Pulse Generator Implant Date: 20250404
Lead Channel Impedance Value: 387.5 Ohm
Lead Channel Pacing Threshold Amplitude: 1.25 V
Lead Channel Pacing Threshold Amplitude: 1.25 V
Lead Channel Pacing Threshold Pulse Width: 0.5 ms
Lead Channel Pacing Threshold Pulse Width: 0.5 ms
Lead Channel Sensing Intrinsic Amplitude: 10.6 mV
Lead Channel Setting Pacing Amplitude: 2.5 V
Lead Channel Setting Pacing Pulse Width: 0.5 ms
Lead Channel Setting Sensing Sensitivity: 0.5 mV
Pulse Gen Serial Number: 211022042
Zone Setting Status: 755011

## 2023-12-04 MED ORDER — INSULIN ASPART 100 UNIT/ML IJ SOLN: INTRAMUSCULAR | 1 refills | Status: DC

## 2023-12-04 NOTE — Telephone Encounter (Signed)
 Pt aware, agreeable, and verbalized understanding  Samples left at front desk for patient and labs ordered and scheduled   Medication Samples have been provided to the patient.  Drug name: Lokelma       Strength: 10g        Qty: 3  LOT: [id number]  Exp.Date: 07/27  Dosing instructions: take as directed by the heart failure clinic  The patient has been instructed regarding the correct time, dose, and frequency of taking this medication, including desired effects and most common side effects.   Shayra Anton M Sophiea Ueda 10:44 AM 12/04/2023

## 2023-12-04 NOTE — Telephone Encounter (Signed)
 Patient called requesting refill of Novolog  be sent to [facility] on [address]. Patient using Medtronic insulin  pump, however unable to determine max dose required based on MD's notes. Sent to MD on call

## 2023-12-04 NOTE — Patient Instructions (Signed)
 Medication Instructions:  Your physician recommends that you continue on your current medications as directed. Please refer to the Current Medication list given to you today.  *If you need a refill on your cardiac medications before your next appointment, please call your pharmacy*  Lab Work: FRIDAY-TSH FreeT4, LFT'S, CMET If you have labs (blood work) drawn today and your tests are completely normal, you will receive your results only by: MyChart Message (if you have MyChart) OR A paper copy in the mail If you have any lab test that is abnormal or we need to change your treatment, we will call you to review the results.  Follow-Up: At [facility], you and your health needs are our priority.  As part of our continuing mission to provide you with exceptional heart care, our providers are all part of one team.  This team includes your primary Cardiologist (physician) and Advanced Practice Providers or APPs (Physician Assistants and Nurse Practitioners) who all work together to provide you with the care you need, when you need it.  Your next appointment:   4 month(s)  Provider:   Daphne Barrack, NP    No driving until 89/12/7972

## 2023-12-04 NOTE — Progress Notes (Signed)
 Electrophysiology Office Note:   Date:  12/04/2023  ID:  FORREST JAROSZEWSKI, DOB April 21, 1971, MRN [id number]  Primary Cardiologist: Ria Commander, DO Primary Heart Failure: None Electrophysiologist: Will Gladis Norton, MD       History of Present Illness:   Johnathan Ross is a 53 y.o. male with h/o VT/VF OOH cardiac arrest s/p ICD, HTN, HLD, relapsing-remitting MS on [place]  (walks with a walker at baseline), hypothyroidism, DM I  seen today for routine electrophysiology follow-up s/p Defibrillator implant.  Since last being seen in our clinic the patient reports doing well since his hospitalization.  He reports he has been working with therapy and feels like he is largely back to his baseline (walks with a walker).  Reports no issues with his device healing.  States that he was seen by the advanced heart failure clinic yesterday with improved EF.  He denies chest pain, palpitations, dyspnea, PND, orthopnea, nausea, vomiting, dizziness, syncope, edema, weight gain, or early satiety.    Review of systems complete and found to be negative unless listed in HPI.   EP Information / Studies Reviewed:    EKG is ordered today. Personal review as below.  EKG Interpretation Date/Time:  Wednesday December 04 2023 14:26:58 EDT Ventricular Rate:  65 PR Interval:  160 QRS Duration:  74 QT Interval:  402 QTC Calculation: 418 R Axis:   106  Text Interpretation: Normal sinus rhythm Low voltage QRS Confirmed by Aniceto Jarvis ([id number]) on 12/04/2023 2:28:03 PM   ICD Interrogation-  reviewed in detail today,  See PACEART report.  Device History: Abbott Single Chamber ICD implanted 08/30/23 for VT/VF Arrest History of appropriate therapy: No History of AAD therapy: Yes; currently on amiodarone    Studies:  ECHO 08/26/23 > LVEF 30-35%  R/LHC 08/26/23 > mild non-obs CAD, severely elevated pre-capillary filling pressures, hemodynamics consistent with sub-optimal RV function cMRI 08/28/23 >    Arrhythmia /  AAD VT / VF 08/26/23 > s/p OOH arrest  Amiodarone  > initiated 07/2023           Physical Exam:   VS:  BP 138/78   Pulse 60   Ht 6' 1 (1.854 m)   Wt 212 lb (96.2 kg)   SpO2 99%   BMI 27.97 kg/m    Wt Readings from Last 3 Encounters:  12/04/23 212 lb (96.2 kg)  12/03/23 212 lb (96.2 kg)  10/15/23 198 lb (89.8 kg)     GEN: Well nourished, well developed in no acute distress NECK: No JVD; No carotid bruits CARDIAC: Regular rate and rhythm, no murmurs, rubs, gallops RESPIRATORY:  Clear to auscultation without rales, wheezing or rhonchi  ABDOMEN: Soft, non-tender, non-distended EXTREMITIES:  LE edema up to knees 1+ pitting; No deformity   ASSESSMENT AND PLAN:    Sudden Cardiac Death / 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  Chronic Systolic HF / NICM  High Risk Medication Monitoring: Amiodarone   -LE edema as above -Discussed Lasix  use, daily weights -Core View volume monitoring turned on -Stable on an appropriate medical regimen / GDMT per Advanced HF Team  -Normal ICD function -See Pace Art report -No changes today -continue amiodarone  200 mg daily for now > plan to see back in 4 months, if no further NSVT or VT episodes, consider reduction to 100 mg daily with brief period of monitoring and if no further episodes consider stopping amiodarone  -update CMP, TSH, free T4  -6 month driving restriction from 6/68/7974 - Oct 1st   Disposition:  Follow up with Dr. Inocencio or EP APP 4 months    Signed, Daphne Barrack, NP-C, AGACNP-BC [facility] - Electrophysiology  12/04/2023, 5:50 PM

## 2023-12-05 NOTE — Progress Notes (Signed)
 Remote ICD transmission.

## 2023-12-06 ENCOUNTER — Ambulatory Visit (HOSPITAL_COMMUNITY)
Admission: RE | Admit: 2023-12-06 | Discharge: 2023-12-06 | Disposition: A | Discharge status: Home / Self Care | Source: Ambulatory Visit | Attending: Cardiology | Admitting: Cardiology

## 2023-12-06 DIAGNOSIS — I5022 Chronic systolic (congestive) heart failure: Secondary | ICD-10-CM | POA: Insufficient documentation

## 2023-12-06 LAB — BASIC METABOLIC PANEL WITH GFR
Anion gap: 12 (ref 5–15)
BUN: 16 mg/dL (ref 6–20)
CO2: 24 mmol/L (ref 22–32)
Calcium: 9.4 mg/dL (ref 8.9–10.3)
Chloride: 101 mmol/L (ref 98–111)
Creatinine, Ser: 1.32 mg/dL — ABNORMAL HIGH (ref 0.61–1.24)
GFR, Estimated: >60 mL/min (ref >60)
Glucose, Bld: 174 mg/dL — ABNORMAL HIGH (ref 70–99)
Potassium: 4.8 mmol/L (ref 3.5–5.1)
Sodium: 137 mmol/L (ref 135–145)

## 2023-12-06 LAB — COMPREHENSIVE METABOLIC PANEL WITH GFR
ALT: 25 U/L (ref 0–44)
AST: 27 U/L (ref 15–41)
Albumin: 4 g/dL (ref 3.5–5.0)
Alkaline Phosphatase: 78 U/L (ref 38–126)
Anion gap: 11 (ref 5–15)
BUN: 16 mg/dL (ref 6–20)
CO2: 25 mmol/L (ref 22–32)
Calcium: 9.5 mg/dL (ref 8.9–10.3)
Chloride: 102 mmol/L (ref 98–111)
Creatinine, Ser: 1.37 mg/dL — ABNORMAL HIGH (ref 0.61–1.24)
GFR, Estimated: >60 mL/min (ref >60)
Glucose, Bld: 174 mg/dL — ABNORMAL HIGH (ref 70–99)
Potassium: 5.2 mmol/L — ABNORMAL HIGH (ref 3.5–5.1)
Sodium: 138 mmol/L (ref 135–145)
Total Bilirubin: 0.7 mg/dL (ref 0.0–1.2)
Total Protein: 6.7 g/dL (ref 6.5–8.1)

## 2023-12-06 LAB — T4, FREE: Free T4: 1.22 ng/dL — ABNORMAL HIGH (ref 0.61–1.12)

## 2023-12-06 LAB — TSH: TSH: 4.671 u[IU]/mL — ABNORMAL HIGH (ref 0.350–4.500)

## 2023-12-06 NOTE — Addendum Note (Signed)
 Encounter addended by: Alvan Fausto SAUNDERS, CMA on: 12/06/2023 12:41 PM  Actions taken: Order list changed, Diagnosis association updated

## 2023-12-06 NOTE — Telephone Encounter (Signed)
 [facility] called and advised us  that they can't see him. Looks like Dr. Joshua has already sent his referral to the hospital.

## 2023-12-06 NOTE — Telephone Encounter (Signed)
 Spoke with Joyce and she advised me that they would NOT be able to do the imagine for this patient. The referral has already been sent to the hospital.

## 2023-12-09 ENCOUNTER — Encounter: Payer: Self-pay | Admitting: Physical Therapy

## 2023-12-09 ENCOUNTER — Ambulatory Visit: Admitting: Physical Therapy

## 2023-12-09 DIAGNOSIS — M6281 Muscle weakness (generalized): Secondary | ICD-10-CM | POA: Diagnosis not present

## 2023-12-09 DIAGNOSIS — R29898 Other symptoms and signs involving the musculoskeletal system: Secondary | ICD-10-CM | POA: Diagnosis not present

## 2023-12-09 DIAGNOSIS — M21372 Foot drop, left foot: Secondary | ICD-10-CM

## 2023-12-09 DIAGNOSIS — R262 Difficulty in walking, not elsewhere classified: Secondary | ICD-10-CM | POA: Diagnosis not present

## 2023-12-09 DIAGNOSIS — R2681 Unsteadiness on feet: Secondary | ICD-10-CM | POA: Diagnosis not present

## 2023-12-09 DIAGNOSIS — R2689 Other abnormalities of gait and mobility: Secondary | ICD-10-CM | POA: Diagnosis not present

## 2023-12-09 DIAGNOSIS — M25511 Pain in right shoulder: Secondary | ICD-10-CM | POA: Diagnosis not present

## 2023-12-09 DIAGNOSIS — R29818 Other symptoms and signs involving the nervous system: Secondary | ICD-10-CM

## 2023-12-09 DIAGNOSIS — M21371 Foot drop, right foot: Secondary | ICD-10-CM

## 2023-12-09 NOTE — Therapy (Signed)
 OUTPATIENT PHYSICAL THERAPY NEURO TREATMENT   Patient Name: Johnathan Ross MRN: [id number] DOB:08-31-1970, 53 y.o., male Today's Date: 12/09/2023   PCP: [name], MD  REFERRING PROVIDER: Pegge Toribio PARAS, PA-C  END OF SESSION:  PT End of Session - 12/09/23 0945     Visit Number 11    Number of Visits 12   12 + eval   Date for PT Re-Evaluation 12/20/23   pushed out due to scheduling delay   Authorization Type [employer]    Authorization Time Period 10-08-23 - 12-20-23    Authorization - Visit Number 10    Authorization - Number of Visits 12    PT Start Time [phone]    PT Stop Time 1022    PT Time Calculation (min) 45 min    Equipment Utilized During Treatment Gait belt    Activity Tolerance Patient tolerated treatment well    Behavior During Therapy [facility] for tasks assessed/performed              Past Medical History:  Diagnosis Date   Hyperlipidemia    Hypertension    Hypogonadism in male 11/01/2016   Hypothyroidism    Multiple sclerosis (HCC)    Neuromuscular disorder (HCC)    Proliferative diabetic retinopathy(362.02)    Type 1 diabetes mellitus (HCC) dx'd 1994   Vitamin D  insufficiency 11/29/2016   Past Surgical History:  Procedure Laterality Date   EYE SURGERY Bilateral    laser for diabetic retinopathy   FRACTURE SURGERY     ICD IMPLANT N/A 08/30/2023   Procedure: ICD IMPLANT;  Surgeon: Inocencio Soyla Lunger, MD;  Location: [facility] INVASIVE CV LAB;  Service: Cardiovascular;  Laterality: N/A;   IR VENO/EXT/UNI LEFT  03/24/2020   OPEN REDUCTION INTERNAL FIXATION (ORIF) TIBIA/FIBULA FRACTURE Right 10/30/2016   ORIF ANKLE FRACTURE Left 2015   ORIF TIBIA FRACTURE Right 10/30/2016   Procedure: OPEN REDUCTION INTERNAL FIXATION (ORIF) TIBIA FIBULA FRACTURE;  Surgeon: Celena Sharper, MD;  Location: [facility] OR;  Service: Orthopedics;  Laterality: Right;   RETINAL LASER PROCEDURE Bilateral    for diabetic retinopathy   RIGHT/LEFT HEART CATH AND CORONARY  ANGIOGRAPHY N/A 08/26/2023   Procedure: RIGHT/LEFT HEART CATH AND CORONARY ANGIOGRAPHY;  Surgeon: Gardenia Led, DO;  Location: [facility] INVAS[facility] CV [facility];  Service: Cardiovascular;  Laterality: N/A;   Patient Active Problem List   Diagnosis Date Noted   Mass of right lower leg 11/13/2023   Attention and concentration deficit 09/10/2023   Acute encephalopathy 09/03/2023   Cardiac arrest (HCC) 08/26/2023   Encounter for screening for HIV 06/17/2023   Need for immunization against influenza 06/17/2023   Loud snoring 06/17/2023   Encounter for general adult medical examination with abnormal findings 08/02/2022   Screening for prostate cancer 08/01/2021   OAB (overactive bladder) 09/21/2020   Essential hypertension 09/21/2020   Thoracic outlet syndrome 05/03/2020   Controlled type 1 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 (HCC) 03/24/2020   Right epiretinal membrane 03/24/2020   Retinal exudates and deposits 03/24/2020   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   Nuclear sclerotic cataract of both eyes 03/24/2020   Gait disorder 03/23/2020   Benign prostatic hyperplasia without lower urinary tract symptoms 03/01/2020   Diabetic gastroparesis associated with type 1 diabetes mellitus (HCC) 03/01/2020   Gait disturbance 08/19/2019   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/19/2019   Vitamin D  insufficiency 11/29/2016   Hypogonadism in male 11/01/2016   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HCC)    Hypothyroidism  Hypertension    Dyslipidemia, goal LDL below 130    MULTIPLE SCLEROSIS 08/29/2007   PROLIFERATIVE DIABETIC RETINOPATHY 08/29/2007    ONSET DATE: 08/26/2023  REFERRING DIAG: G93.40 (ICD-10-CM) - Acute encephalopathy  THERAPY DIAG:  Other abnormalities of gait and mobility  Muscle weakness (generalized)  Other symptoms and signs involving the nervous system  Unsteadiness on feet  Other symptoms and signs involving the musculoskeletal  system  Difficulty in walking, not elsewhere classified  Foot drop, right  Foot drop, left  Rationale for Evaluation and Treatment: Rehabilitation  SUBJECTIVE:                                                                                                                                                                                             SUBJECTIVE STATEMENT: Pt amb. To PT today with use of RW - using intermittent reciprocal pattern, doing better with this today.  Olivia is here from [facility] for further assessment of orthotic needs.  Pt accompanied by: self and gf (present during session)  PERTINENT HISTORY: vitamin D  insufficiency, Type 1 IDDM w/ polyneuropathy, diabetic retinopathy, hypothyroidism, HTN, hypercholestrolemia, h/o Rt tibia fracture, h/o Lt ankle fracture with ORIF, MS with acquired diplegia (MS diagnosis 2014/2015)  In the week prior to his cardiac arrest he reported having some near syncopal episodes.  On 3/31 he went into witnessed cardiac arrest w/ bystander performing CPR for 20-25 minutes.  Per EMS notes he had persistent V-fib requiring 8 total defibrillations, 2 doses of epi and some ROSC w/ V tach.  He was also given Narcan in the field due to small pupils.  PAIN:  Are you having pain? No - pain and stiffness in bilateral shoulders w/ IR  PRECAUTIONS: Fall and ICD/Pacemaker (Can raise R shoulder as of May 9th - 6wks post placement of ICD; monitor BP - if systolic drops below 100 he is to follow-up with Cardiologist about medication management)  RED FLAGS: None   WEIGHT BEARING RESTRICTIONS: No  FALLS: Has patient fallen in last 6 months? No  LIVING ENVIRONMENT: Lives with: lives with their partner Lives in: House/apartment Stairs: Yes: External: 5 steps; on right going up, on left going up, and can reach both Has following equipment at home: Single point cane, Walker - 2 wheeled, Wheelchair (manual), and shower chair  PLOF: Requires assistive  device for independence and Needs assistance with homemaking  PATIENT GOALS: fix my brace and if there's anything we can do for my shoulder   OBJECTIVE:  Note: Objective measures were completed at Evaluation unless otherwise noted.  DIAGNOSTIC FINDINGS:  MRI Cardiac 08/29/2023: IMPRESSION: 1. Mild to  moderate decrease in left ventricular systolic function (LVEF =40%).   2. There is no late gadolinium enhancement in the left ventricular myocardium. Normal parametric mapping and tissue characterization.   3. Mild decrease in right ventricular systolic function (RVEF =45%).  CT Head 08/26/2023: IMPRESSION: 1. No evidence of acute intracranial abnormality. 2. White matter lesions better characterized on prior MRI.  CT Angio Chest PE 08/26/2023: IMPRESSION: 1. No pulmonary embolism. 2. Moderate left anterior descending coronary artery calcification. 3. Extensive dependent pulmonary consolidation throughout the lungs which may relate to aspiration or multifocal infection.  *Updated imaging ordered by Dr. Vear (MRI of brain and C-spine)   COGNITION: Overall cognitive status: Within functional limits for tasks assessed   SENSATION: Light touch: WFL   MUSCLE TONE: Increased extensor tone of LLE >RLE but ROM and coordination limited bilaterally due to extensor tone/spasticity    POSTURE: rounded shoulders, forward head, and weight shift right  LOWER EXTREMITY ROM:   Limited by extensor tone/spasticity   Active  Right Eval Left Eval  Hip flexion    Hip extension    Hip abduction    Hip adduction    Hip internal rotation    Hip external rotation    Knee flexion    Knee extension    Ankle dorsiflexion    Ankle plantarflexion    Ankle inversion    Ankle eversion     (Blank rows = not tested)  LOWER EXTREMITY MMT:  Tested in seated position   MMT Right Eval Left Eval  Hip flexion 1 1  Hip extension    Hip abduction 4- 4-  Hip adduction    Hip internal rotation     Hip external rotation    Knee flexion 2 3-  Knee extension 3+ 1  Ankle dorsiflexion AFO donned AFO donned  Ankle plantarflexion    Ankle inversion    Ankle eversion    (Blank rows = not tested)  BED MOBILITY:  Independent per pt - still in a standard bed, needs increased time to roll L and R  TRANSFERS: Assistive device utilized: Environmental consultant - 2 wheeled  Sit to stand: SBA Stand to sit: SBA Heavy reliance on BUEs to essentially throw himself out of chair, wide BOS, better hip extension today when standing to 2WW, when he lets go he maintains anterior lean in attempt to prevent knees locking out, when knees lock out he loses his balance.   GAIT: Gait pattern: step to pattern, decreased step length- Left, decreased stride length, decreased hip/knee flexion- Right, decreased hip/knee flexion- Left, decreased ankle dorsiflexion- Right, decreased ankle dorsiflexion- Left, lateral hip instability, trunk flexed, narrow BOS, and poor foot clearance- Left Distance walked: Various clinic distances  Assistive device utilized: Walker - 2 wheeled Level of assistance: SBA Comments: Pt required 9 minutes to ambulate into treatment room (~30') , frequently stopping to rest as his legs lock up. Noted excessive inversion of L ankle as well as scissoring of LLE. Pt relying heavily on BUEs to perform more of a swing-to pattern.   FUNCTIONAL TESTS:  TREATMENT: 12/09/23  TA:   Patient, orthotist, and therapist discuss following options for orthotic changes: -wedging left toe vs right heel to change toe catch and limit spring mechanism of L AFO vs toe cap (pt remains uninterested in this option) -padding to reshape bottom of left AFO to mimic how broken in the R AFO is as orthotist feels this may be contributing to differences he notes. -potentially heating the toe to change  angle of toe lift - orthotist is looking into capability of this -adding padding to anterior left strap for sturdier support to lean into during heel strike (Pt needs to make [facility] appt for trimming left AFO and potentially adding padding options - PT wrote note as reminder per pt request)  TE: -Arm bike x4 minutes forward > x4 minutes backwards at level 2.0 for cardiovascular endurance and BUE ROM, cuing him to improve upright posture helps with popping in R shoulder during task  NMR: -Standing at counter w/ varied uni vs bilateral UE support depending on task demand for reaching low to high bottom drawers at sink to place varying resistance clips > overhead placement w/ side step and reach outside BOS for placement and removal from high cabinet handles  PATIENT EDUCATION: Education details: Please call [facility] and schedule appt. Continue HEP.   Person educated: Patient Education method: Explanation, Demonstration, Tactile cues, Verbal cues, and Handouts Education comprehension: verbalized understanding, returned demonstration, verbal cues required, tactile cues required, and needs further education  HOME EXERCISE PROGRAM:  Access Code: [email or web address] URL: [email or web address] Date: 10/28/2023 Prepared by: Daved Bull  Exercises - Supine Thoracic Mobilization Towel Roll Vertical with Arm Stretch  - 1 x daily - 5 x weekly - 1 sets - 2 minutes hold - Supine Lower Trunk Rotation  - 1 x daily - 5 x weekly - 2 sets - 10 reps - Seated Gastroc Stretch with Strap  - 1 x daily - 7 x weekly - 2 sets - 60 second hold - Standing Gastroc Stretch at Counter  - 2 x daily - 7 x weekly - 1 sets - 3 reps - 30 sec hold - Heel Sits  - 1 x daily - 5 x weekly - 1 sets - 12 reps - Tall Kneeling Diagonal Lift with Medicine Ball  - 1 x daily - 5 x weekly - 1 sets - 5-10 reps - Marching Bridge  - 1 x daily - 5 x weekly - 1 sets - 6 reps - Clamshell  - 1 x daily - 5 x weekly - 1-2 sets - 10  reps - Long Sitting Hamstring Stretch  - 1 x daily - 5 x weekly - 1 sets - 2-3 reps - 30-45 seconds hold - Seated Knee Flexion  - 1 x daily - 5 x weekly - 1-2 sets - 4 reps - Seated Hip Abduction with Resistance  - 1 x daily - 5 x weekly - 2 sets - 10 reps - Seated Scapular Retraction  - 1 x daily - 7 x weekly - 3 sets - 10 reps - Single Arm Doorway Pec Stretch at 90 Degrees Abduction  - 1 x daily - 7 x weekly - 1 sets - 3-5 reps - 30 sec each hold - Open Book Chest Stretch on Towel Roll  - 1 x daily - 7 x weekly - 1 sets - 10 reps - Supine Shoulder External Rotation Stretch  - 1 x daily - 7 x weekly - 1-2 sets - 10 reps - Supine  Shoulder Internal Rotation Stretch  - 1 x daily - 7 x weekly - 1-2 sets - 10 reps  HEP updated:  for shoulder strengthening and stretching Access Code: [email or web address] URL: [email or web address] Date: 11/06/2023 Prepared by: Rock Kussmaul  Exercises - Standing Bilateral Shoulder Internal Rotation AAROM with Dowel  - 1 x daily - 7 x weekly - 3 sets - 10 reps - Standing Shoulder Extension with Dowel  - 1 x daily - 7 x weekly - 3 sets - 10 reps - Standing Overhead Shoulder External Rotation Stretch with Towel  - 1 x daily - 7 x weekly - 3 sets - 10 reps - Corner Pec Major Stretch  - 1 x daily - 7 x weekly - 1 sets - 2-3 reps - 15-20 hold - Doorway Pec Stretch at 120 Degrees Abduction  - 1 x daily - 7 x weekly - 1 sets - 2-3 reps - 15 hold  GOALS: Goals reviewed with patient? Yes  SHORT TERM GOALS: Target date: 11/08/2023  PT will review, condense and modify HEP to meet current functional level. Baseline: to be reviewed from previous POC Goal status: Goal met 11-07-23   Patient will obtain cooling vest from [employer] to improve ability to continue activity in warmer months. Baseline: PT will fax request to [employer] if pt unable to obtain on his own.  Pt has not followed up and we will do this next visit (6/16) Goal status: IN PROGRESS    [facility] rep  and PT to further assess and adjust left AFO if able to improve seating into brace for improved utilization. Baseline: PT contacted Olivia at [facility] and followed up, pt wants Orthotist to come to clinic so will likely be later appt (6/16) Goal status: IN PROGRESS   LONG TERM GOALS: Target date: 12/06/2023   Patient will tolerate low to moderate level cardio focused exercise for >/=10 minutes w/ RPE of no more than 5/10 in order to demonstrate tolerance to steady state activity. Baseline: To be introduced. Goal status: INITIAL  Patient will verbalize strategies for energy conservation to improve compliance to safety recommendations. Baseline: To be reviewed. Goal status: INITIAL  Patient will report no right shoulder pain with light activity in order to improve functional use of UE. Baseline: 1-2/10 w/ repetition Goal status: INITIAL  4.  Patient will demonstrate unsupported standing x30 seconds without LOB in order to improve upright safety and functional participation in ADLs. Baseline:  BLE lockout w/ LOB  Goal status:  INITIAL  5.  Patient will demonstrate reach to floor from standing at no more than supervision level to improve functional upright mobility and independence. Baseline:  Cannot reach to floor without BLE collapsing  Goal status:  INITIAL  ASSESSMENT:  CLINICAL IMPRESSION: Session focused varied today as pt had ongoing orthotic needs requiring orthotist consult.  Several options discussed to improve his left orthotic fit during ambulation as he reports the heel pops up too much during heel strike.  Pt is not agreeable to suggested toe cap to ease intermittent toe catch, but is open to padding options for foot portion and strap of AFO as well as possible internal wedging options.  He remains challenged during forward reaching tasks today due to difficulty utilizing hips to anterior weight shift into standing.  He continues to benefit from postural work in this setting to  preserve shoulders, improve fall risk associated with rounded posture, and improve cardiac efficiency by keeping anterior chest space open.  Will continue per  POC.  OBJECTIVE IMPAIRMENTS: Abnormal gait, cardiopulmonary status limiting activity, decreased activity tolerance, decreased balance, decreased cognition, decreased coordination, decreased endurance, decreased knowledge of condition, decreased knowledge of use of DME, decreased mobility, difficulty walking, decreased ROM, decreased strength, decreased safety awareness, impaired perceived functional ability, increased muscle spasms, impaired sensation, impaired tone, improper body mechanics, and pain  ACTIVITY LIMITATIONS: carrying, lifting, bending, standing, squatting, stairs, transfers, reach over head, locomotion level, and caring for others  PARTICIPATION LIMITATIONS: cleaning, driving, shopping, community activity, occupation, and yard work  PERSONAL FACTORS: Behavior pattern, Education, Fitness, Past/current experiences, and 1-2 comorbidities: MS and significant cardiac involvement are also affecting patient's functional outcome.   REHAB POTENTIAL: Fair see personal factors and PMH  CLINICAL DECISION MAKING: Unstable/unpredictable  EVALUATION COMPLEXITY: High  PLAN:  PT FREQUENCY: 1-2x/week (2x/wk for initial 4 wks and 1x/wk for last 4 wks)  PT DURATION: 8 weeks  PLANNED INTERVENTIONS: [id number]- PT Re-evaluation, 97750- Physical Performance Testing, 97110-Therapeutic exercises, 97530- Therapeutic activity, [id number]- Neuromuscular re-education, 97535- Self Care, [id number]- Manual therapy, [id number]- Gait training, [phone]- Orthotic Initial, [phone]- Orthotic/Prosthetic subsequent, [phone]- Aquatic Therapy, [phone]- Electrical stimulation (manual), Patient/Family education, Balance training, Stair training, Taping, Dry Needling, Joint mobilization, DME instructions, Wheelchair mobility training, Cryotherapy, and Moist heat  PLAN FOR NEXT SESSION:   Paperwork for cooling vest given to pt on 11-21-23 (informed him that we would help to complete if needed);  cont. With cardio activities/exercises  Reaching to floor level.  Standing balance. Did he schedule [facility] appt?  Cardiac focus - modified BORG response and circuit style training.   Daved KATHEE Bull, PT, DPT  12/09/2023, 10:32 AM

## 2023-12-10 NOTE — Telephone Encounter (Signed)
 Addressed with pt via phone encounter Pt aware of results via mychart Aware values for K 3.5-5.1 At this time provider has not made any changes to regimen,

## 2023-12-11 ENCOUNTER — Ambulatory Visit: Payer: Self-pay | Admitting: Cardiology

## 2023-12-12 ENCOUNTER — Ambulatory Visit (HOSPITAL_COMMUNITY)
Admission: RE | Admit: 2023-12-12 | Discharge: 2023-12-12 | Disposition: A | Discharge status: Home / Self Care | Source: Ambulatory Visit | Attending: General Surgery | Admitting: General Surgery

## 2023-12-12 DIAGNOSIS — R2241 Localized swelling, mass and lump, right lower limb: Secondary | ICD-10-CM | POA: Diagnosis not present

## 2023-12-12 DIAGNOSIS — R600 Localized edema: Secondary | ICD-10-CM | POA: Diagnosis not present

## 2023-12-16 ENCOUNTER — Ambulatory Visit: Payer: Self-pay | Admitting: Internal Medicine

## 2023-12-16 ENCOUNTER — Other Ambulatory Visit (HOSPITAL_COMMUNITY)

## 2023-12-16 ENCOUNTER — Ambulatory Visit (INDEPENDENT_AMBULATORY_CARE_PROVIDER_SITE_OTHER): Payer: Medicare PPO | Admitting: Internal Medicine

## 2023-12-16 ENCOUNTER — Encounter: Payer: Self-pay | Admitting: Internal Medicine

## 2023-12-16 VITALS — BP 132/76 | HR 61 | Temp 98.4°F | Resp 16 | Ht 73.0 in | Wt 213.4 lb

## 2023-12-16 DIAGNOSIS — Z0001 Encounter for general adult medical examination with abnormal findings: Secondary | ICD-10-CM

## 2023-12-16 DIAGNOSIS — Z125 Encounter for screening for malignant neoplasm of prostate: Secondary | ICD-10-CM

## 2023-12-16 DIAGNOSIS — Z0000 Encounter for general adult medical examination without abnormal findings: Secondary | ICD-10-CM | POA: Diagnosis not present

## 2023-12-16 DIAGNOSIS — Z23 Encounter for immunization: Secondary | ICD-10-CM

## 2023-12-16 DIAGNOSIS — I10 Essential (primary) hypertension: Secondary | ICD-10-CM

## 2023-12-16 DIAGNOSIS — E1042 Type 1 diabetes mellitus with diabetic polyneuropathy: Secondary | ICD-10-CM | POA: Diagnosis not present

## 2023-12-16 LAB — HEMOGLOBIN A1C: Hgb A1c MFr Bld: 7.9 % — ABNORMAL HIGH (ref 4.6–6.5)

## 2023-12-16 LAB — URINALYSIS, ROUTINE W REFLEX MICROSCOPIC
Bilirubin Urine: NEGATIVE
Hgb urine dipstick: NEGATIVE
Ketones, ur: NEGATIVE
Leukocytes,Ua: NEGATIVE
Nitrite: NEGATIVE
RBC / HPF: NONE SEEN (ref 0–?)
Specific Gravity, Urine: 1.015 (ref 1.000–1.030)
Total Protein, Urine: NEGATIVE
Urine Glucose: 250 — AB
Urobilinogen, UA: 1 (ref 0.0–1.0)
WBC, UA: NONE SEEN (ref 0–?)
pH: 6.5 (ref 5.0–8.0)

## 2023-12-16 LAB — MICROALBUMIN / CREATININE URINE RATIO
Creatinine,U: 131.5 mg/dL
Microalb Creat Ratio: UNDETERMINED mg/g (ref 0.0–30.0)
Microalb, Ur: <0.7 mg/dL

## 2023-12-16 LAB — PSA: PSA: 0.99 ng/mL (ref 0.10–4.00)

## 2023-12-16 LAB — BASIC METABOLIC PANEL WITH GFR
BUN: 20 mg/dL (ref 6–23)
CO2: 28 meq/L (ref 19–32)
Calcium: 9.2 mg/dL (ref 8.4–10.5)
Chloride: 102 meq/L (ref 96–112)
Creatinine, Ser: 1.31 mg/dL (ref 0.40–1.50)
GFR: 63.09 mL/min (ref >60.00)
Glucose, Bld: 174 mg/dL — ABNORMAL HIGH (ref 70–99)
Potassium: 4.4 meq/L (ref 3.5–5.1)
Sodium: 137 meq/L (ref 135–145)

## 2023-12-16 MED ORDER — SHINGRIX 50 MCG/0.5ML IM SUSR
0.5000 mL | Freq: Once | INTRAMUSCULAR | 1 refills | Status: AC
Start: 2023-12-16 — End: 2023-12-16

## 2023-12-16 MED ORDER — BOOSTRIX 5-2.5-18.5 LF-MCG/0.5 IM SUSP: 0.5000 mL | Freq: Once | INTRAMUSCULAR | 0 refills | Status: AC

## 2023-12-16 NOTE — Progress Notes (Unsigned)
 Subjective:  Patient ID: Johnathan Ross, male    DOB: Dec 29, 1970  Age: 53 y.o. MRN: [id number]  CC: Annual Exam, Hypertension, Hypothyroidism, Hyperlipidemia, and Diabetes   HPI Johnathan Ross presents for a CPX and f/up ----  Discussed the use of AI scribe software for clinical note transcription with the patient, who gave verbal consent to proceed.  History of Present Illness Johnathan Ross is a 53 year old male with a history of heart issues and edema who presents for follow-up and evaluation of leg concerns.  He is concerned about a lesion on his leg and had an ultrasound performed last Thursday. He is currently awaiting the results of this test.  He has a history of heart issues and has a defibrillator implanted. He regularly sees a cardiologist and is prescribed a diuretic to manage fluid retention, which provides some relief. No current chest pain, shortness of breath, dizziness, or lightheadedness.  He experiences edema in his legs, which he attributes to a past ankle injury. The edema fluctuates daily but has not resolved since his heart operation. Although not bothersome, he wishes it would resolve.  He has a history of high potassium levels and was given a sample packet to address this issue. He plans to follow up on the effectiveness of this treatment.      Outpatient Medications Prior to Visit  Medication Sig Dispense Refill   acetaminophen  (TYLENOL ) 325 MG tablet Take 1-2 tablets (325-650 mg total) by mouth every 4 (four) hours as needed for mild pain (pain score 1-3).     amiodarone  (PACERONE ) 200 MG tablet Take 1 tablet (200 mg total) by mouth daily. 90 tablet 0   aspirin  EC 81 MG tablet Take 1 tablet (81 mg total) by mouth daily. Swallow whole.     bisoprolol  (ZEBETA ) 5 MG tablet Take 1 tablet (5 mg total) by mouth daily. 30 tablet 6   dalfampridine  10 MG TB12 Take 1 tablet (10 mg total) by mouth 2 (two) times daily. 180 tablet 3   furosemide  (LASIX ) 20 MG  tablet TAKE 1 TABLET AS NEEDED FOR FLUID OR EDEMA. FOR WEIGHT GAIN OF 3LB IN 24 HOURS OR 5 LB IN A WEEK 90 tablet 1   insulin  aspart (NOVOLOG ) 100 UNIT/ML injection USE UP TO 100 UNITS max dose IN INSULIN  PUMP DAILY-DX CODE E10.8 90 mL 1   Insulin  Human (INSULIN  PUMP) SOLN Inject 1 each into the skin 3 times daily with meals, bedtime and 2 AM.     levocetirizine (XYZAL ) 5 MG tablet TAKE 1 TABLET BY MOUTH EVERY DAY IN THE EVENING 90 tablet 1   levothyroxine  (SYNTHROID ) 125 MCG tablet Take 1 tablet (125 mcg total) by mouth daily at 6 (six) AM. 90 tablet 0   losartan  (COZAAR ) 50 MG tablet Take 1 tablet (50 mg total) by mouth daily. 30 tablet 5   rOPINIRole  (REQUIP ) 0.25 MG tablet Take 1 tablet (0.25 mg total) by mouth at bedtime. 90 tablet 0   simvastatin  (ZOCOR ) 20 MG tablet Take 1 tablet (20 mg total) by mouth daily at 6 PM. 90 tablet 0   vitamin D3 (CHOLECALCIFEROL ) 25 MCG tablet Take 1 tablet (1,000 Units total) by mouth daily. 30 tablet 0   MYRBETRIQ 50 MG TB24 tablet Take 50 mg by mouth daily.     No facility-administered medications prior to visit.    ROS Review of Systems  Objective:  BP 132/76 (BP Location: Left Arm, Patient Position: Sitting, Cuff Size: Normal)  Pulse 61   Temp 98.4 F (36.9 C) (Oral)   Resp 16   Ht 6' 1 (1.854 m)   Wt 213 lb 6.4 oz (96.8 kg)   SpO2 98%   BMI 28.15 kg/m   BP Readings from Last 3 Encounters:  12/16/23 132/76  12/04/23 138/78  12/03/23 130/78    Wt Readings from Last 3 Encounters:  12/16/23 213 lb 6.4 oz (96.8 kg)  12/04/23 212 lb (96.2 kg)  12/03/23 212 lb (96.2 kg)    Physical Exam Musculoskeletal:     Right lower leg: 1+ Pitting Edema present.     Left lower leg: 1+ Pitting Edema present.     Lab Results  Component Value Date   WBC 8.5 09/10/2023   HGB 13.4 09/10/2023   HCT 40.0 09/10/2023   PLT 293 09/10/2023   GLUCOSE 174 (H) 12/06/2023   CHOL 136 07/08/2023   TRIG 55 08/27/2023   HDL 50 07/08/2023   LDLDIRECT  65.0 02/09/2021   LDLCALC 72 07/08/2023   ALT 25 12/06/2023   AST 27 12/06/2023   NA 138 12/06/2023   K 5.2 (H) 12/06/2023   CL 102 12/06/2023   CREATININE 1.37 (H) 12/06/2023   BUN 16 12/06/2023   CO2 25 12/06/2023   TSH 4.671 (H) 12/06/2023   PSA 1.11 08/02/2022   INR 1.1 08/26/2023   HGBA1C 7.3 (H) 07/08/2023   MICROALBUR 5.2 (H) 12/27/2008    No results found.  Assessment & Plan:  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HCC) -     Basic metabolic panel with GFR; Future -     Microalbumin / creatinine urine ratio; Future -     Urinalysis, Routine w reflex microscopic; Future -     Hemoglobin A1c; Future -     HM Diabetes Foot Exam -     Ambulatory referral to Ophthalmology  Essential hypertension -     Basic metabolic panel with GFR; Future -     Urinalysis, Routine w reflex microscopic; Future  Encounter for general adult medical examination with abnormal findings  Screening for prostate cancer -     PSA; Future  Need for prophylactic vaccination with combined diphtheria-tetanus-pertussis (DTP) vaccine -     Boostrix ; Inject 0.5 mLs into the muscle once for 1 dose.  Dispense: 0.5 mL; Refill: 0  Need for prophylactic vaccination and inoculation against varicella -     Shingrix ; Inject 0.5 mLs into the muscle once for 1 dose.  Dispense: 0.5 mL; Refill: 1     Follow-up: Return in about 6 months (around 06/17/2024).  Debby Molt, MD

## 2023-12-16 NOTE — Patient Instructions (Signed)
 Health Maintenance, Male  Adopting a healthy lifestyle and getting preventive care are important in promoting health and wellness. Ask your health care provider about:  The right schedule for you to have regular tests and exams.  Things you can do on your own to prevent diseases and keep yourself healthy.  What should I know about diet, weight, and exercise?  Eat a healthy diet    Eat a diet that includes plenty of vegetables, fruits, low-fat dairy products, and lean protein.  Do not eat a lot of foods that are high in solid fats, added sugars, or sodium.  Maintain a healthy weight  Body mass index (BMI) is a measurement that can be used to identify possible weight problems. It estimates body fat based on height and weight. Your health care provider can help determine your BMI and help you achieve or maintain a healthy weight.  Get regular exercise  Get regular exercise. This is one of the most important things you can do for your health. Most adults should:  Exercise for at least 150 minutes each week. The exercise should increase your heart rate and make you sweat (moderate-intensity exercise).  Do strengthening exercises at least twice a week. This is in addition to the moderate-intensity exercise.  Spend less time sitting. Even light physical activity can be beneficial.  Watch cholesterol and blood lipids  Have your blood tested for lipids and cholesterol at 51 years of age, then have this test every 5 years.  You may need to have your cholesterol levels checked more often if:  Your lipid or cholesterol levels are high.  You are older than 51 years of age.  You are at high risk for heart disease.  What should I know about cancer screening?  Many types of cancers can be detected early and may often be prevented. Depending on your health history and family history, you may need to have cancer screening at various ages. This may include screening for:  Colorectal cancer.  Prostate cancer.  Skin cancer.  Lung  cancer.  What should I know about heart disease, diabetes, and high blood pressure?  Blood pressure and heart disease  High blood pressure causes heart disease and increases the risk of stroke. This is more likely to develop in people who have high blood pressure readings or are overweight.  Talk with your health care provider about your target blood pressure readings.  Have your blood pressure checked:  Every 3-5 years if you are 9-95 years of age.  Every year if you are 85 years old or older.  If you are between the ages of 29 and 29 and are a current or former smoker, ask your health care provider if you should have a one-time screening for abdominal aortic aneurysm (AAA).  Diabetes  Have regular diabetes screenings. This checks your fasting blood sugar level. Have the screening done:  Once every three years after age 23 if you are at a normal weight and have a low risk for diabetes.  More often and at a younger age if you are overweight or have a high risk for diabetes.  What should I know about preventing infection?  Hepatitis B  If you have a higher risk for hepatitis B, you should be screened for this virus. Talk with your health care provider to find out if you are at risk for hepatitis B infection.  Hepatitis C  Blood testing is recommended for:  Everyone born from [birth year] through 1963.  Anyone  with known risk factors for hepatitis C.  Sexually transmitted infections (STIs)  You should be screened each year for STIs, including gonorrhea and chlamydia, if:  You are sexually active and are younger than 51 years of age.  You are older than 51 years of age and your health care provider tells you that you are at risk for this type of infection.  Your sexual activity has changed since you were last screened, and you are at increased risk for chlamydia or gonorrhea. Ask your health care provider if you are at risk.  Ask your health care provider about whether you are at high risk for HIV. Your health care provider  may recommend a prescription medicine to help prevent HIV infection. If you choose to take medicine to prevent HIV, you should first get tested for HIV. You should then be tested every 3 months for as long as you are taking the medicine.  Follow these instructions at home:  Alcohol use  Do not drink alcohol if your health care provider tells you not to drink.  If you drink alcohol:  Limit how much you have to 0-2 drinks a day.  Know how much alcohol is in your drink. In the [place] one drink equals one 12 oz bottle of beer (355 mL), one 5 oz glass of wine (148 mL), or one 1 oz glass of hard liquor (44 mL).  Lifestyle  Do not use any products that contain nicotine or tobacco. These products include cigarettes, chewing tobacco, and vaping devices, such as e-cigarettes. If you need help quitting, ask your health care provider.  Do not use street drugs.  Do not share needles.  Ask your health care provider for help if you need support or information about quitting drugs.  General instructions  Schedule regular health, dental, and eye exams.  Stay current with your vaccines.  Tell your health care provider if:  You often feel depressed.  You have ever been abused or do not feel safe at home.  Summary  Adopting a healthy lifestyle and getting preventive care are important in promoting health and wellness.  Follow your health care provider's instructions about healthy diet, exercising, and getting tested or screened for diseases.  Follow your health care provider's instructions on monitoring your cholesterol and blood pressure.  This information is not intended to replace advice given to you by your health care provider. Make sure you discuss any questions you have with your health care provider.  Document Revised: 10/03/2020 Document Reviewed: 10/03/2020  [employer]vier Patient Education  2024 [employer].

## 2023-12-19 ENCOUNTER — Ambulatory Visit: Admitting: Physical Therapy

## 2023-12-19 ENCOUNTER — Encounter: Payer: Self-pay | Admitting: Physical Therapy

## 2023-12-19 DIAGNOSIS — R29898 Other symptoms and signs involving the musculoskeletal system: Secondary | ICD-10-CM | POA: Diagnosis not present

## 2023-12-19 DIAGNOSIS — M21372 Foot drop, left foot: Secondary | ICD-10-CM

## 2023-12-19 DIAGNOSIS — M21371 Foot drop, right foot: Secondary | ICD-10-CM

## 2023-12-19 DIAGNOSIS — M6281 Muscle weakness (generalized): Secondary | ICD-10-CM

## 2023-12-19 DIAGNOSIS — R29818 Other symptoms and signs involving the nervous system: Secondary | ICD-10-CM

## 2023-12-19 DIAGNOSIS — M25511 Pain in right shoulder: Secondary | ICD-10-CM | POA: Diagnosis not present

## 2023-12-19 DIAGNOSIS — R262 Difficulty in walking, not elsewhere classified: Secondary | ICD-10-CM

## 2023-12-19 DIAGNOSIS — R2681 Unsteadiness on feet: Secondary | ICD-10-CM

## 2023-12-19 DIAGNOSIS — R2689 Other abnormalities of gait and mobility: Secondary | ICD-10-CM | POA: Diagnosis not present

## 2023-12-19 DIAGNOSIS — G8929 Other chronic pain: Secondary | ICD-10-CM

## 2023-12-19 NOTE — Therapy (Signed)
 OUTPATIENT PHYSICAL THERAPY NEURO TREATMENT - RECERT   Patient Name: Johnathan Ross MRN: [id number] DOB:August 31, 1970, 53 y.o., male Today's Date: 12/19/2023   PCP: [name], MD  REFERRING PROVIDER: Pegge Toribio PARAS, PA-C  END OF SESSION:  PT End of Session - 12/19/23 1503     Visit Number 12    Number of Visits 16   12 + 4   Date for PT Re-Evaluation 01/31/24   pushed out due to scheduling delay - pt preference   Authorization Type [employer]    Authorization Time Period 10-08-23 - 12-20-23    Authorization - Number of Visits 12    PT Start Time 1455   PT with pt prior   PT Stop Time 1533    PT Time Calculation (min) 38 min    Equipment Utilized During Treatment Gait belt    Activity Tolerance Patient tolerated treatment well    Behavior During Therapy [facility] for tasks assessed/performed              Past Medical History:  Diagnosis Date   Hyperlipidemia    Hypertension    Hypogonadism in male 11/01/2016   Hypothyroidism    Multiple sclerosis (HCC)    Neuromuscular disorder (HCC)    Proliferative diabetic retinopathy(362.02)    Type 1 diabetes mellitus (HCC) dx'd 1994   Vitamin D  insufficiency 11/29/2016   Past Surgical History:  Procedure Laterality Date   EYE SURGERY Bilateral    laser for diabetic retinopathy   FRACTURE SURGERY     ICD IMPLANT N/A 08/30/2023   Procedure: ICD IMPLANT;  Surgeon: Inocencio Soyla Lunger, MD;  Location: [facility] INVASIVE [facility];  Service: Cardiovascular;  Laterality: N/A;   IR VENO/EXT/UNI LEFT  03/24/2020   OPEN REDUCTION INTERNAL FIXATION (ORIF) TIBIA/FIBULA FRACTURE Right 10/30/2016   ORIF ANKLE FRACTURE Left 2015   ORIF TIBIA FRACTURE Right 10/30/2016   Procedure: OPEN REDUCTION INTERNAL FIXATION (ORIF) TIBIA FIBULA FRACTURE;  Surgeon: Celena Sharper, MD;  Location: [facility] OR;  Service: Orthopedics;  Laterality: Right;   RETINAL LASER PROCEDURE Bilateral    for diabetic retinopathy   RIGHT/LEFT HEART CATH AND CORONARY  ANGIOGRAPHY N/A 08/26/2023   Procedure: RIGHT/LEFT HEART CATH AND CORONARY ANGIOGRAPHY;  Surgeon: Gardenia Led, DO;  Location: [facility] IN[facility];  Service: Cardiovascular;  Laterality: N/A;   Patient Active Problem List   Diagnosis Date Noted   Mass of right lower leg 11/13/2023   Attention and concentration deficit 09/10/2023   Need for immunization against influenza 06/17/2023   Loud snoring 06/17/2023   Encounter for general adult medical examination with abnormal findings 08/02/2022   Screening for prostate cancer 08/01/2021   OAB (overactive bladder) 09/21/2020   Essential hypertension 09/21/2020   Thoracic outlet syndrome 05/03/2020   Controlled type 1 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 (HCC) 03/24/2020   Right epiretinal membrane 03/24/2020   Retinal exudates and deposits 03/24/2020   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   Nuclear sclerotic cataract of both eyes 03/24/2020   Gait disorder 03/23/2020   Benign prostatic hyperplasia without lower urinary tract symptoms 03/01/2020   Diabetic gastroparesis associated with type 1 diabetes mellitus (HCC) 03/01/2020   Gait disturbance 08/19/2019   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/19/2019   Vitamin D  insufficiency 11/29/2016   Hypogonadism in male 11/01/2016   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HCC)    Hypothyroidism    Hypertension    Dyslipidemia, goal LDL below 130    MULTIPLE SCLEROSIS  08/29/2007   PROLIFERATIVE DIABETIC RETINOPATHY 08/29/2007    ONSET DATE: 08/26/2023  REFERRING DIAG: G93.40 (ICD-10-CM) - Acute encephalopathy  THERAPY DIAG:  Other abnormalities of gait and mobility - Plan: PT plan of care cert/re-cert  Muscle weakness (generalized) - Plan: PT plan of care cert/re-cert  Other symptoms and signs involving the nervous system - Plan: PT plan of care cert/re-cert  Unsteadiness on feet - Plan: PT plan of care cert/re-cert  Other symptoms and signs  involving the musculoskeletal system - Plan: PT plan of care cert/re-cert  Difficulty in walking, not elsewhere classified - Plan: PT plan of care cert/re-cert  Foot drop, right - Plan: PT plan of care cert/re-cert  Foot drop, left - Plan: PT plan of care cert/re-cert  Chronic right shoulder pain - Plan: PT plan of care cert/re-cert  Rationale for Evaluation and Treatment: Rehabilitation  SUBJECTIVE:                                                                                                                                                                                             SUBJECTIVE STATEMENT: Pt amb. To PT today with use of RW - using intermittent reciprocal pattern, having more difficulty today.  He has not called [facility] to make appt for orthotic adjustments.  Pt accompanied by: self and gf (present during session)  PERTINENT HISTORY: vitamin D  insufficiency, Type 1 IDDM w/ polyneuropathy, diabetic retinopathy, hypothyroidism, HTN, hypercholestrolemia, h/o Rt tibia fracture, h/o Lt ankle fracture with ORIF, MS with acquired diplegia (MS diagnosis 2014/2015)  In the week prior to his cardiac arrest he reported having some near syncopal episodes.  On 3/31 he went into witnessed cardiac arrest w/ bystander performing CPR for 20-25 minutes.  Per EMS notes he had persistent V-fib requiring 8 total defibrillations, 2 doses of epi and some ROSC w/ V tach.  He was also given Narcan in the field due to small pupils.  PAIN:  Are you having pain? No - pain and stiffness in bilateral shoulders w/ IR  PRECAUTIONS: Fall and ICD/Pacemaker (Can raise R shoulder as of May 9th - 6wks post placement of ICD; monitor BP - if systolic drops below 100 he is to follow-up with Cardiologist about medication management)  RED FLAGS: None   WEIGHT BEARING RESTRICTIONS: No  FALLS: Has patient fallen in last 6 months? No  LIVING ENVIRONMENT: Lives with: lives with their partner Lives in:  House/apartment Stairs: Yes: External: 5 steps; on right going up, on left going up, and can reach both Has following equipment at home: Single point cane, Walker - 2 wheeled, Wheelchair (manual), and shower chair  PLOF: Requires assistive device for independence and Needs assistance with homemaking  PATIENT GOALS: fix my brace and if there's anything we can do for my shoulder   OBJECTIVE:  Note: Objective measures were completed at Evaluation unless otherwise noted.  DIAGNOSTIC FINDINGS:  MRI Cardiac 08/29/2023: IMPRESSION: 1. Mild to moderate decrease in left ventricular systolic function (LVEF =40%).   2. There is no late gadolinium enhancement in the left ventricular myocardium. Normal parametric mapping and tissue characterization.   3. Mild decrease in right ventricular systolic function (RVEF =45%).  CT Head 08/26/2023: IMPRESSION: 1. No evidence of acute intracranial abnormality. 2. White matter lesions better characterized on prior MRI.  CT Angio Chest PE 08/26/2023: IMPRESSION: 1. No pulmonary embolism. 2. Moderate left anterior descending coronary artery calcification. 3. Extensive dependent pulmonary consolidation throughout the lungs which may relate to aspiration or multifocal infection.  *Updated imaging ordered by Dr. Vear (MRI of brain and C-spine)   COGNITION: Overall cognitive status: Within functional limits for tasks assessed   SENSATION: Light touch: WFL   MUSCLE TONE: Increased extensor tone of LLE >RLE but ROM and coordination limited bilaterally due to extensor tone/spasticity    POSTURE: rounded shoulders, forward head, and weight shift right  LOWER EXTREMITY ROM:   Limited by extensor tone/spasticity   Active  Right Eval Left Eval  Hip flexion    Hip extension    Hip abduction    Hip adduction    Hip internal rotation    Hip external rotation    Knee flexion    Knee extension    Ankle dorsiflexion    Ankle plantarflexion     Ankle inversion    Ankle eversion     (Blank rows = not tested)  LOWER EXTREMITY MMT:  Tested in seated position   MMT Right Eval Left Eval  Hip flexion 1 1  Hip extension    Hip abduction 4- 4-  Hip adduction    Hip internal rotation    Hip external rotation    Knee flexion 2 3-  Knee extension 3+ 1  Ankle dorsiflexion AFO donned AFO donned  Ankle plantarflexion    Ankle inversion    Ankle eversion    (Blank rows = not tested)  BED MOBILITY:  Independent per pt - still in a standard bed, needs increased time to roll L and R  TRANSFERS: Assistive device utilized: Environmental consultant - 2 wheeled  Sit to stand: SBA Stand to sit: SBA Heavy reliance on BUEs to essentially throw himself out of chair, wide BOS, better hip extension today when standing to 2WW, when he lets go he maintains anterior lean in attempt to prevent knees locking out, when knees lock out he loses his balance.   GAIT: Gait pattern: step to pattern, decreased step length- Left, decreased stride length, decreased hip/knee flexion- Right, decreased hip/knee flexion- Left, decreased ankle dorsiflexion- Right, decreased ankle dorsiflexion- Left, lateral hip instability, trunk flexed, narrow BOS, and poor foot clearance- Left Distance walked: Various clinic distances  Assistive device utilized: Walker - 2 wheeled Level of assistance: SBA Comments: Pt required 9 minutes to ambulate into treatment room (~30') , frequently stopping to rest as his legs lock up. Noted excessive inversion of L ankle as well as scissoring of LLE. Pt relying heavily on BUEs to perform more of a swing-to pattern.   FUNCTIONAL TESTS:  TREATMENT: 12/19/23  -Pt unable to verbalize strategies, when reviewed he disagrees with strategies as he does tasks in whatever order because I don't see the point and I make sure  everything gets done that I want to get done that day.  He feels no energy advantage when using these strategies over his preferred. -Attempted standing unsupported:  6 seconds w/ flexed posture -x2 attempts to reach to floor from standing without support, pt unable to bend past 90 degrees due to feeling LE begin to waver and PF push forward, minA or BUE support needed for balance -Arm bike x10 minutes (3x2 minutes forward, 2x2 minutes backwards) on level 6.0, has some anterior shoulder clicking with posterior rolling - discussed that he may have some long head of the biceps irritation among other tendon irritation and should discuss this with doctor as PT has a major goal of maintaining functional shoulder use and strength due to gait pattern.  He verbalized he would mention this to his PCP.  PATIENT EDUCATION: Education details: Please call [facility] and schedule appt. Continue HEP.  Progress towards goals and process for re-cert today.  Discussed MS deficits and fluctuations he notes in his own performance.  Discussed using modifications and compensations like reacher as these are not going to make him weaker but instead help prevent secondary (fall) and overuse injuries. Person educated: Patient Education method: Explanation, Demonstration, Tactile cues, Verbal cues, and Handouts Education comprehension: verbalized understanding, returned demonstration, verbal cues required, tactile cues required, and needs further education  HOME EXERCISE PROGRAM:  Access Code: [email or web address] URL: [email or web address] Date: 10/28/2023 Prepared by: Daved Bull  Exercises - Supine Thoracic Mobilization Towel Roll Vertical with Arm Stretch  - 1 x daily - 5 x weekly - 1 sets - 2 minutes hold - Supine Lower Trunk Rotation  - 1 x daily - 5 x weekly - 2 sets - 10 reps - Seated Gastroc Stretch with Strap  - 1 x daily - 7 x weekly - 2 sets - 60 second hold - Standing Gastroc Stretch at Counter  - 2 x  daily - 7 x weekly - 1 sets - 3 reps - 30 sec hold - Heel Sits  - 1 x daily - 5 x weekly - 1 sets - 12 reps - Tall Kneeling Diagonal Lift with Medicine Ball  - 1 x daily - 5 x weekly - 1 sets - 5-10 reps - Marching Bridge  - 1 x daily - 5 x weekly - 1 sets - 6 reps - Clamshell  - 1 x daily - 5 x weekly - 1-2 sets - 10 reps - Long Sitting Hamstring Stretch  - 1 x daily - 5 x weekly - 1 sets - 2-3 reps - 30-45 seconds hold - Seated Knee Flexion  - 1 x daily - 5 x weekly - 1-2 sets - 4 reps - Seated Hip Abduction with Resistance  - 1 x daily - 5 x weekly - 2 sets - 10 reps - Seated Scapular Retraction  - 1 x daily - 7 x weekly - 3 sets - 10 reps - Single Arm Doorway Pec Stretch at 90 Degrees Abduction  - 1 x daily - 7 x weekly - 1 sets - 3-5 reps - 30 sec each hold - Open Book Chest Stretch on Towel Roll  - 1 x daily - 7 x weekly - 1 sets - 10 reps - Supine Shoulder External Rotation Stretch  - 1 x daily - 7 x weekly -  1-2 sets - 10 reps - Supine Shoulder Internal Rotation Stretch  - 1 x daily - 7 x weekly - 1-2 sets - 10 reps  HEP updated:  for shoulder strengthening and stretching Access Code: [email or web address] URL: [email or web address] Date: 11/06/2023 Prepared by: Rock Kussmaul  Exercises - Standing Bilateral Shoulder Internal Rotation AAROM with Dowel  - 1 x daily - 7 x weekly - 3 sets - 10 reps - Standing Shoulder Extension with Dowel  - 1 x daily - 7 x weekly - 3 sets - 10 reps - Standing Overhead Shoulder External Rotation Stretch with Towel  - 1 x daily - 7 x weekly - 3 sets - 10 reps - Corner Pec Major Stretch  - 1 x daily - 7 x weekly - 1 sets - 2-3 reps - 15-20 hold - Doorway Pec Stretch at 120 Degrees Abduction  - 1 x daily - 7 x weekly - 1 sets - 2-3 reps - 15 hold  GOALS: Goals reviewed with patient? Yes  SHORT TERM GOALS: Target date: 11/08/2023  PT will review, condense and modify HEP to meet current functional level. Baseline: to be reviewed from previous POC Goal  status: Goal met 11-07-23   Patient will obtain cooling vest from [employer] to improve ability to continue activity in warmer months. Baseline: PT will fax request to [employer] if pt unable to obtain on his own.  Pt has not followed up and we will do this next visit (6/16) Goal status: IN PROGRESS    [facility] rep and PT to further assess and adjust left AFO if able to improve seating into brace for improved utilization. Baseline: PT contacted [facility] at [facility] and followed up, pt wants Orthotist to come to clinic so will likely be later appt (6/16) Goal status: IN PROGRESS   LONG TERM GOALS: Target date: 12/06/2023   Patient will tolerate low to moderate level cardio focused exercise for >/=10 minutes w/ RPE of no more than 5/10 in order to demonstrate tolerance to steady state activity. Baseline: 10 minutes RPE 0/10 (7/24) Goal status: MET  Patient will verbalize strategies for energy conservation to improve compliance to safety recommendations. Baseline: Reviewed with teachback (7/24) Goal status: MET  Patient will report no right shoulder pain with light activity in order to improve functional use of UE. Baseline: 1-2/10 w/ repetition; continues to have IR discomfort with movement (7/24) Goal status: NOT MET  4.  Patient will demonstrate unsupported standing x30 seconds without LOB in order to improve upright safety and functional participation in ADLs. Baseline:  BLE lockout w/ LOB; 6 seconds w/ flexed posture (7/24)  Goal status:  IN PROGRESS  5.  Patient will demonstrate reach to floor from standing at no more than supervision level to improve functional upright mobility and independence. Baseline:  Cannot reach to floor without BLE collapsing; minA or BUE assistance for balance (7/24)  Goal status:  NOT MET  GOALS (at 2/75 re-cert): Goals reviewed with patient? Yes  SHORT TERM GOALS = LONG TERM GOALS: Target date: 01/17/2024  Patient will report no right shoulder pain with  light activity in order to improve functional use of UE. Baseline: 1-2/10 w/ repetition; continues to have IR discomfort with movement (7/24) Goal status: ONGOING  2.  Patient will demonstrate unsupported standing x30 seconds without LOB in order to improve upright safety and functional participation in ADLs. Baseline:  BLE lockout w/ LOB; 6 seconds w/ flexed posture (7/24)  Goal status:  IN PROGRESS  3.  Patient will demonstrate reach to floor using no more than single UE support from standing at no more than supervision level to improve functional upright mobility and independence. Baseline:  Cannot reach to floor without BLE collapsing; minA or BUE assistance for balance (7/24)  Goal status:  ONGOING  ASSESSMENT:  CLINICAL IMPRESSION: Session focused on assessing LTGs in preparation for re-cert.  Goal of re-cert to address functional mobility variations like reaching to the floor and shoulder preservation as he continues to have some pain with movement.  His endurance is back to baseline and he continues to have medical follow-up regarding lesion in his LE - PT to follow-up with pt regarding.  He continues to benefit from skilled PT interventions to prevent functional decline and optimize upright independence. Will continue per POC.  OBJECTIVE IMPAIRMENTS: Abnormal gait, cardiopulmonary status limiting activity, decreased activity tolerance, decreased balance, decreased cognition, decreased coordination, decreased endurance, decreased knowledge of condition, decreased knowledge of use of DME, decreased mobility, difficulty walking, decreased ROM, decreased strength, decreased safety awareness, impaired perceived functional ability, increased muscle spasms, impaired sensation, impaired tone, improper body mechanics, and pain  ACTIVITY LIMITATIONS: carrying, lifting, bending, standing, squatting, stairs, transfers, reach over head, locomotion level, and caring for others  PARTICIPATION  LIMITATIONS: cleaning, driving, shopping, community activity, occupation, and yard work  PERSONAL FACTORS: Behavior pattern, Education, Fitness, Past/current experiences, and 1-2 comorbidities: MS and significant cardiac involvement are also affecting patient's functional outcome.   REHAB POTENTIAL: Fair see personal factors and PMH  CLINICAL DECISION MAKING: Unstable/unpredictable  EVALUATION COMPLEXITY: High  PLAN:  PT FREQUENCY: 1-2x/week (2x/wk for initial 4 wks and 1x/wk for last 4 wks)  PT DURATION: 8 weeks  PLANNED INTERVENTIONS: [id number]- PT Re-evaluation, 97750- Physical Performance Testing, 97110-Therapeutic exercises, 97530- Therapeutic activity, [id number]- Neuromuscular re-education, 97535- Self Care, [id number]- Manual therapy, [id number]- Gait training, [phone]- Orthotic Initial, [phone]- Orthotic/Prosthetic subsequent, [phone]- Aquatic Therapy, [phone]- Electrical stimulation (manual), Patient/Family education, Balance training, Stair training, Taping, Dry Needling, Joint mobilization, DME instructions, Wheelchair mobility training, Cryotherapy, and Moist heat  PLAN FOR NEXT SESSION:  Paperwork for cooling vest given to pt on 11-21-23 (informed him that we would help to complete if needed);  cont. With cardio activities/exercises  Reaching to floor level.  Standing from low/compliant surfaces - this has recently become difficult for him.  Standing balance. Did he schedule [facility] appt?  Cardiac focus - modified BORG response and circuit style training.   Daved KATHEE Bull, PT, DPT  12/19/2023, 3:43 PM

## 2023-12-31 ENCOUNTER — Other Ambulatory Visit (HOSPITAL_COMMUNITY): Payer: Self-pay | Admitting: *Deleted

## 2023-12-31 MED ORDER — AMIODARONE HCL 200 MG PO TABS: 200.0000 mg | ORAL_TABLET | Freq: Every day | ORAL | 0 refills | Status: DC

## 2024-01-01 ENCOUNTER — Telehealth: Payer: Self-pay | Admitting: Student in an Organized Health Care Education/Training Program

## 2024-01-01 NOTE — Telephone Encounter (Signed)
 Spoke with patient regarding pain in his face that he was wondering if was related to his ICD. Has has had 3 episodes today and 1 last week. They are less than a second each time, unrelated to position/movement. He reports an feeling of electrical sensation in his left face but denies any muscle contraction/pain at the device site or down either arm. No focal deficits. He has an [employer] ICD implanted 08/30/23. Doesn't sound like a device therapy. I asked him to send a remote transmission and we can review tomorrow. He isn't having any further of the episodes currently.

## 2024-01-02 NOTE — Telephone Encounter (Signed)
 Please see below from Dr. Almetta. Will request device team to follow-up on device transmission and call patient.

## 2024-01-02 NOTE — Telephone Encounter (Signed)
 Reviewed remote transmission: Normal device function No episodes/no therapies VP <1%  Do not see anything on device to correlate with his symptoms.   He and I did discuss a recent shingles outbreak to left side of upper torso in past few months.  Wonder if this could be some residual neuropathic pain as it is primarily to left side of face.  I recommended he follow up with his PCP to evaluate further.   Patient thanks us  for the follow up and agrees to reach out to PCP.

## 2024-01-04 ENCOUNTER — Other Ambulatory Visit (HOSPITAL_COMMUNITY): Payer: Self-pay | Admitting: Family Medicine

## 2024-01-04 DIAGNOSIS — E782 Mixed hyperlipidemia: Secondary | ICD-10-CM

## 2024-01-07 ENCOUNTER — Ambulatory Visit: Attending: Physician Assistant | Admitting: Physical Therapy

## 2024-01-07 ENCOUNTER — Encounter: Payer: Self-pay | Admitting: Physical Therapy

## 2024-01-07 DIAGNOSIS — R29818 Other symptoms and signs involving the nervous system: Secondary | ICD-10-CM | POA: Insufficient documentation

## 2024-01-07 DIAGNOSIS — M25511 Pain in right shoulder: Secondary | ICD-10-CM | POA: Insufficient documentation

## 2024-01-07 DIAGNOSIS — R262 Difficulty in walking, not elsewhere classified: Secondary | ICD-10-CM | POA: Insufficient documentation

## 2024-01-07 DIAGNOSIS — R2681 Unsteadiness on feet: Secondary | ICD-10-CM | POA: Diagnosis not present

## 2024-01-07 DIAGNOSIS — G8929 Other chronic pain: Secondary | ICD-10-CM | POA: Insufficient documentation

## 2024-01-07 DIAGNOSIS — M6281 Muscle weakness (generalized): Secondary | ICD-10-CM | POA: Diagnosis not present

## 2024-01-07 DIAGNOSIS — M21372 Foot drop, left foot: Secondary | ICD-10-CM | POA: Diagnosis not present

## 2024-01-07 DIAGNOSIS — R2689 Other abnormalities of gait and mobility: Secondary | ICD-10-CM | POA: Diagnosis not present

## 2024-01-07 DIAGNOSIS — R29898 Other symptoms and signs involving the musculoskeletal system: Secondary | ICD-10-CM | POA: Diagnosis not present

## 2024-01-07 DIAGNOSIS — M21371 Foot drop, right foot: Secondary | ICD-10-CM | POA: Diagnosis not present

## 2024-01-07 NOTE — Therapy (Signed)
 OUTPATIENT PHYSICAL THERAPY NEURO TREATMENT   Patient Name: Johnathan Ross MRN: [id number] DOB:November 06, 1970, 53 y.o., male Today's Date: 01/07/2024   PCP: [name], MD  REFERRING PROVIDER: Pegge Toribio PARAS, PA-C  END OF SESSION:  PT End of Session - 01/07/24 1114     Visit Number 13    Number of Visits 16   12 + 4   Date for PT Re-Evaluation 01/31/24   pushed out due to scheduling delay - pt preference   Authorization Type [employer]    Authorization Time Period 10-08-23 - 12-20-23    Authorization - Number of Visits 12    PT Start Time 1105    PT Stop Time 1150    PT Time Calculation (min) 45 min    Equipment Utilized During Treatment Gait belt    Activity Tolerance Patient tolerated treatment well    Behavior During Therapy [facility] for tasks assessed/performed              Past Medical History:  Diagnosis Date   Hyperlipidemia    Hypertension    Hypogonadism in male 11/01/2016   Hypothyroidism    Multiple sclerosis (HCC)    Neuromuscular disorder (HCC)    Proliferative diabetic retinopathy(362.02)    Type 1 diabetes mellitus (HCC) dx'd 1994   Vitamin D  insufficiency 11/29/2016   Past Surgical History:  Procedure Laterality Date   EYE SURGERY Bilateral    laser for diabetic retinopathy   FRACTURE SURGERY     ICD IMPLANT N/A 08/30/2023   Procedure: ICD IMPLANT;  Surgeon: Inocencio Soyla Lunger, MD;  Location: [facility] IN[facility]IVE [facility];  Service: Cardiovascular;  Laterality: N/A;   IR VENO/EXT/UNI LEFT  03/24/2020   OPEN REDUCTION INTERNAL FIXATION (ORIF) TIBIA/FIBULA FRACTURE Right 10/30/2016   ORIF ANKLE FRACTURE Left 2015   ORIF TIBIA FRACTURE Right 10/30/2016   Procedure: OPEN REDUCTION INTERNAL FIXATION (ORIF) TIBIA FIBULA FRACTURE;  Surgeon: Celena Sharper, MD;  Location: [facility] OR;  Service: Orthopedics;  Laterality: Right;   RETINAL LASER PROCEDURE Bilateral    for diabetic retinopathy   RIGHT/LEFT HEART CATH AND CORONARY ANGIOGRAPHY N/A 08/26/2023    Procedure: RIGHT/LEFT HEART CATH AND CORONARY ANGIOGRAPHY;  Surgeon: Gardenia Led, DO;  Location: [facility];  Service: Cardiovascular;  Laterality: N/A;   Patient Active Problem List   Diagnosis Date Noted   Mass of right lower leg 11/13/2023   Attention and concentration deficit 09/10/2023   Need for immunization against influenza 06/17/2023   Loud snoring 06/17/2023   Encounter for general adult medical examination with abnormal findings 08/02/2022   Screening for prostate cancer 08/01/2021   OAB (overactive bladder) 09/21/2020   Essential hypertension 09/21/2020   Thoracic outlet syndrome 05/03/2020   Controlled type 1 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 (HCC) 03/24/2020   Right epiretinal membrane 03/24/2020   Retinal exudates and deposits 03/24/2020   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   Nuclear sclerotic cataract of both eyes 03/24/2020   Gait disorder 03/23/2020   Benign prostatic hyperplasia without lower urinary tract symptoms 03/01/2020   Diabetic gastroparesis associated with type 1 diabetes mellitus (HCC) 03/01/2020   Gait disturbance 08/19/2019   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/19/2019   Vitamin D  insufficiency 11/29/2016   Hypogonadism in male 11/01/2016   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HCC)    Hypothyroidism    Hypertension    Dyslipidemia, goal LDL below 130    MULTIPLE SCLEROSIS 08/29/2007   PROLIFERATIVE DIABETIC RETINOPATHY 08/29/2007  ONSET DATE: 08/26/2023  REFERRING DIAG: G93.40 (ICD-10-CM) - Acute encephalopathy  THERAPY DIAG:  Other abnormalities of gait and mobility  Muscle weakness (generalized)  Other symptoms and signs involving the nervous system  Unsteadiness on feet  Other symptoms and signs involving the musculoskeletal system  Difficulty in walking, not elsewhere classified  Foot drop, right  Foot drop, left  Chronic right shoulder pain  Rationale for  Evaluation and Treatment: Rehabilitation  SUBJECTIVE:                                                                                                                                                                                             SUBJECTIVE STATEMENT: Pt amb. To PT today with use of RW - using intermittent reciprocal pattern, having more difficulty today.  He has R knee pain today due to 2 recent falls going down the same stairs without his braces on.  He was using the left rail and on the first fall he stepped down with the LLE and fell onto a bent RLE (heel under bottom).  The second fall he swung his body backwards to prevent falling face first and slid down on his back.  He denies hitting his head either time, but did hit his right shoulder on second fall.  He is not wearing braces today as he notices no difference in his performance.  He has not made adjustment appt for R AFO fit with [facility].  Pt accompanied by: self and gf (present during session)  PERTINENT HISTORY: vitamin D  insufficiency, Type 1 IDDM w/ polyneuropathy, diabetic retinopathy, hypothyroidism, HTN, hypercholestrolemia, h/o Rt tibia fracture, h/o Lt ankle fracture with ORIF, MS with acquired diplegia (MS diagnosis 2014/2015)  In the week prior to his cardiac arrest he reported having some near syncopal episodes.  On 3/31 he went into witnessed cardiac arrest w/ bystander performing CPR for 20-25 minutes.  Per EMS notes he had persistent V-fib requiring 8 total defibrillations, 2 doses of epi and some ROSC w/ V tach.  He was also given Narcan in the field due to small pupils.  PAIN:  Are you having pain? Yes: NPRS scale: 3 Pain location: back of right knee Pain description: achy Aggravating factors: standing on RLE Relieving factors: getting off of RLE  PRECAUTIONS: Fall and ICD/Pacemaker (Can raise R shoulder as of May 9th - 6wks post placement of ICD; monitor BP - if systolic drops below 100 he is to  follow-up with Cardiologist about medication management)  RED FLAGS: None   WEIGHT BEARING RESTRICTIONS: No  FALLS: Has patient fallen in last 6 months? No  LIVING ENVIRONMENT:  Lives with: lives with their partner Lives in: House/apartment Stairs: Yes: External: 5 steps; on right going up, on left going up, and can reach both Has following equipment at home: Single point cane, Walker - 2 wheeled, Wheelchair (manual), and shower chair  PLOF: Requires assistive device for independence and Needs assistance with homemaking  PATIENT GOALS: fix my brace and if there's anything we can do for my shoulder   OBJECTIVE:  Note: Objective measures were completed at Evaluation unless otherwise noted.  DIAGNOSTIC FINDINGS:  MRI Cardiac 08/29/2023: IMPRESSION: 1. Mild to moderate decrease in left ventricular systolic function (LVEF =40%).   2. There is no late gadolinium enhancement in the left ventricular myocardium. Normal parametric mapping and tissue characterization.   3. Mild decrease in right ventricular systolic function (RVEF =45%).  CT Head 08/26/2023: IMPRESSION: 1. No evidence of acute intracranial abnormality. 2. White matter lesions better characterized on prior MRI.  CT Angio Chest PE 08/26/2023: IMPRESSION: 1. No pulmonary embolism. 2. Moderate left anterior descending coronary artery calcification. 3. Extensive dependent pulmonary consolidation throughout the lungs which may relate to aspiration or multifocal infection.  *Updated imaging ordered by Dr. Vear (MRI of brain and C-spine)   COGNITION: Overall cognitive status: Within functional limits for tasks assessed   SENSATION: Light touch: WFL   MUSCLE TONE: Increased extensor tone of LLE >RLE but ROM and coordination limited bilaterally due to extensor tone/spasticity    POSTURE: rounded shoulders, forward head, and weight shift right  LOWER EXTREMITY ROM:   Limited by extensor tone/spasticity   Active   Right Eval Left Eval  Hip flexion    Hip extension    Hip abduction    Hip adduction    Hip internal rotation    Hip external rotation    Knee flexion    Knee extension    Ankle dorsiflexion    Ankle plantarflexion    Ankle inversion    Ankle eversion     (Blank rows = not tested)  LOWER EXTREMITY MMT:  Tested in seated position   MMT Right Eval Left Eval  Hip flexion 1 1  Hip extension    Hip abduction 4- 4-  Hip adduction    Hip internal rotation    Hip external rotation    Knee flexion 2 3-  Knee extension 3+ 1  Ankle dorsiflexion AFO donned AFO donned  Ankle plantarflexion    Ankle inversion    Ankle eversion    (Blank rows = not tested)  BED MOBILITY:  Independent per pt - still in a standard bed, needs increased time to roll L and R  TRANSFERS: Assistive device utilized: Environmental consultant - 2 wheeled  Sit to stand: SBA Stand to sit: SBA Heavy reliance on BUEs to essentially throw himself out of chair, wide BOS, better hip extension today when standing to 2WW, when he lets go he maintains anterior lean in attempt to prevent knees locking out, when knees lock out he loses his balance.   GAIT: Gait pattern: step to pattern, decreased step length- Left, decreased stride length, decreased hip/knee flexion- Right, decreased hip/knee flexion- Left, decreased ankle dorsiflexion- Right, decreased ankle dorsiflexion- Left, lateral hip instability, trunk flexed, narrow BOS, and poor foot clearance- Left Distance walked: Various clinic distances  Assistive device utilized: Walker - 2 wheeled Level of assistance: SBA Comments: Pt required 9 minutes to ambulate into treatment room (~30') , frequently stopping to rest as his legs lock up. Noted excessive inversion of L ankle as  well as scissoring of LLE. Pt relying heavily on BUEs to perform more of a swing-to pattern.   FUNCTIONAL TESTS:                                                                                                                                  TREATMENT: 01/07/24  -Assessed R knee - no TTP around tibial landmarks, fibular head of knee joint line or patella/landmarks or around femoral condyles.  No redness or swelling noted.  Most painful in locked out or hyperextended positioning of knee. -Assessed pitting edema in bilateral ankles (2+) - recommended resuming compression stockings for management during daytime -Unsupported standing w/ PT guarding R knee in soft bend position for 17 bimanual reach > 15.25 > 12.75 > 11 x2 rounds; all rounds to fatigue or LOB into sitting on mat, cues for eccentric control (pt has no ability to control sit in mid-range of motion)  PATIENT EDUCATION: Education details: Please call [facility] and schedule appt. Continue HEP.  Progress towards goals and process for re-cert today.  Discussed anatomy as it correlates to tasks he struggles with and MS changes.  Recommending 2-hand support for stairs, reaching to floor, and walking - do not furniture walk!  If pain persists in right knee or acute symptoms occur please seek medical evaluation! Person educated: Patient Education method: Explanation, Demonstration, Tactile cues, Verbal cues, and Handouts Education comprehension: verbalized understanding, returned demonstration, verbal cues required, tactile cues required, and needs further education  HOME EXERCISE PROGRAM:  Access Code: [email or web address] URL: [email or web address] Date: 10/28/2023 Prepared by: Daved Bull  Exercises - Supine Thoracic Mobilization Towel Roll Vertical with Arm Stretch  - 1 x daily - 5 x weekly - 1 sets - 2 minutes hold - Supine Lower Trunk Rotation  - 1 x daily - 5 x weekly - 2 sets - 10 reps - Seated Gastroc Stretch with Strap  - 1 x daily - 7 x weekly - 2 sets - 60 second hold - Standing Gastroc Stretch at Counter  - 2 x daily - 7 x weekly - 1 sets - 3 reps - 30 sec hold - Heel Sits  - 1 x daily - 5 x weekly - 1 sets - 12 reps -  Tall Kneeling Diagonal Lift with Medicine Ball  - 1 x daily - 5 x weekly - 1 sets - 5-10 reps - Marching Bridge  - 1 x daily - 5 x weekly - 1 sets - 6 reps - Clamshell  - 1 x daily - 5 x weekly - 1-2 sets - 10 reps - Long Sitting Hamstring Stretch  - 1 x daily - 5 x weekly - 1 sets - 2-3 reps - 30-45 seconds hold - Seated Knee Flexion  - 1 x daily - 5 x weekly - 1-2 sets - 4 reps - Seated Hip Abduction with Resistance  - 1 x daily - 5 x weekly - 2 sets -  10 reps - Seated Scapular Retraction  - 1 x daily - 7 x weekly - 3 sets - 10 reps - Single Arm Doorway Pec Stretch at 90 Degrees Abduction  - 1 x daily - 7 x weekly - 1 sets - 3-5 reps - 30 sec each hold - Open Book Chest Stretch on Towel Roll  - 1 x daily - 7 x weekly - 1 sets - 10 reps - Supine Shoulder External Rotation Stretch  - 1 x daily - 7 x weekly - 1-2 sets - 10 reps - Supine Shoulder Internal Rotation Stretch  - 1 x daily - 7 x weekly - 1-2 sets - 10 reps  HEP updated:  for shoulder strengthening and stretching Access Code: [email or web address] URL: [email or web address] Date: 11/06/2023 Prepared by: Rock Kussmaul  Exercises - Standing Bilateral Shoulder Internal Rotation AAROM with Dowel  - 1 x daily - 7 x weekly - 3 sets - 10 reps - Standing Shoulder Extension with Dowel  - 1 x daily - 7 x weekly - 3 sets - 10 reps - Standing Overhead Shoulder External Rotation Stretch with Towel  - 1 x daily - 7 x weekly - 3 sets - 10 reps - Corner Pec Major Stretch  - 1 x daily - 7 x weekly - 1 sets - 2-3 reps - 15-20 hold - Doorway Pec Stretch at 120 Degrees Abduction  - 1 x daily - 7 x weekly - 1 sets - 2-3 reps - 15 hold  GOALS: Goals reviewed with patient? Yes  SHORT TERM GOALS: Target date: 11/08/2023  PT will review, condense and modify HEP to meet current functional level. Baseline: to be reviewed from previous POC Goal status: Goal met 11-07-23   Patient will obtain cooling vest from [employer] to improve ability to continue  activity in warmer months. Baseline: PT will fax request to [employer] if pt unable to obtain on his own.  Pt has not followed up and we will do this next visit (6/16) Goal status: IN PROGRESS    [facility] rep and PT to further assess and adjust left AFO if able to improve seating into brace for improved utilization. Baseline: PT contacted Olivia at [facility] and followed up, pt wants Orthotist to come to clinic so will likely be later appt (6/16) Goal status: IN PROGRESS   LONG TERM GOALS: Target date: 12/06/2023   Patient will tolerate low to moderate level cardio focused exercise for >/=10 minutes w/ RPE of no more than 5/10 in order to demonstrate tolerance to steady state activity. Baseline: 10 minutes RPE 0/10 (7/24) Goal status: MET  Patient will verbalize strategies for energy conservation to improve compliance to safety recommendations. Baseline: Reviewed with teachback (7/24) Goal status: MET  Patient will report no right shoulder pain with light activity in order to improve functional use of UE. Baseline: 1-2/10 w/ repetition; continues to have IR discomfort with movement (7/24) Goal status: NOT MET  4.  Patient will demonstrate unsupported standing x30 seconds without LOB in order to improve upright safety and functional participation in ADLs. Baseline:  BLE lockout w/ LOB; 6 seconds w/ flexed posture (7/24)  Goal status:  IN PROGRESS  5.  Patient will demonstrate reach to floor from standing at no more than supervision level to improve functional upright mobility and independence. Baseline:  Cannot reach to floor without BLE collapsing; minA or BUE assistance for balance (7/24)  Goal status:  NOT MET  GOALS (at 2/75 re-cert): Goals reviewed with  patient? Yes  SHORT TERM GOALS = LONG TERM GOALS: Target date: 01/17/2024  Patient will report no right shoulder pain with light activity in order to improve functional use of UE. Baseline: 1-2/10 w/ repetition; continues to have  IR discomfort with movement (7/24) Goal status: ONGOING  2.  Patient will demonstrate unsupported standing x30 seconds without LOB in order to improve upright safety and functional participation in ADLs. Baseline:  BLE lockout w/ LOB; 6 seconds w/ flexed posture (7/24)  Goal status:  IN PROGRESS  3.  Patient will demonstrate reach to floor using no more than single UE support from standing at no more than supervision level to improve functional upright mobility and independence. Baseline:  Cannot reach to floor without BLE collapsing; minA or BUE assistance for balance (7/24)  Goal status:  ONGOING  ASSESSMENT:  CLINICAL IMPRESSION: Emphasis of skilled session on assessing R knee post-fall.  No acute symptoms or immediate concern requiring emergency assessment but pt instructed to monitor condition and seek attention as needed.  Pt did discuss safety at length today.  He requires min-modA to block and facilitate R knee bend to improve unsupported standing and reach to 11 inch (lowest) height today.  He demonstrates variable muscle fatigue throughout requiring spontaneous seated recovery. Will continue per POC.  OBJECTIVE IMPAIRMENTS: Abnormal gait, cardiopulmonary status limiting activity, decreased activity tolerance, decreased balance, decreased cognition, decreased coordination, decreased endurance, decreased knowledge of condition, decreased knowledge of use of DME, decreased mobility, difficulty walking, decreased ROM, decreased strength, decreased safety awareness, impaired perceived functional ability, increased muscle spasms, impaired sensation, impaired tone, improper body mechanics, and pain  ACTIVITY LIMITATIONS: carrying, lifting, bending, standing, squatting, stairs, transfers, reach over head, locomotion level, and caring for others  PARTICIPATION LIMITATIONS: cleaning, driving, shopping, community activity, occupation, and yard work  PERSONAL FACTORS: Behavior pattern, Education,  Fitness, Past/current experiences, and 1-2 comorbidities: MS and significant cardiac involvement are also affecting patient's functional outcome.   REHAB POTENTIAL: Fair see personal factors and PMH  CLINICAL DECISION MAKING: Unstable/unpredictable  EVALUATION COMPLEXITY: High  PLAN:  PT FREQUENCY: 1-2x/week (2x/wk for initial 4 wks and 1x/wk for last 4 wks)  PT DURATION: 8 weeks  PLANNED INTERVENTIONS: [id number]- PT Re-evaluation, 97750- Physical Performance Testing, 97110-Therapeutic exercises, 97530- Therapeutic activity, [id number]- Neuromuscular re-education, 97535- Self Care, [id number]- Manual therapy, [id number]- Gait training, [phone]- Orthotic Initial, [phone]- Orthotic/Prosthetic subsequent, [phone]- Aquatic Therapy, [phone]- Electrical stimulation (manual), Patient/Family education, Balance training, Stair training, Taping, Dry Needling, Joint mobilization, DME instructions, Wheelchair mobility training, Cryotherapy, and Moist heat  PLAN FOR NEXT SESSION:  Paperwork for cooling vest given to pt on 11-21-23 (informed him that we would help to complete if needed);  cont. With cardio activities/exercises  Reaching to floor level.  Standing from low/compliant surfaces - this has recently become difficult for him.  Standing balance. Did he schedule [facility] appt?  Cardiac focus - modified BORG response and circuit style training.   Stairs w/ rail and loftstrand for home as he does not have 2 rails on stairs? - he has loftstrands; reaching to lower heights (<11)  Daved KATHEE Bull, PT, DPT  01/07/2024, 11:54 AM

## 2024-01-08 ENCOUNTER — Ambulatory Visit: Payer: Medicare PPO | Admitting: Neurology

## 2024-01-08 ENCOUNTER — Encounter: Payer: Self-pay | Admitting: Neurology

## 2024-01-08 VITALS — BP 122/81 | HR 60 | Ht 73.0 in | Wt 216.0 lb

## 2024-01-08 DIAGNOSIS — G35 Multiple sclerosis: Secondary | ICD-10-CM

## 2024-01-08 DIAGNOSIS — N3281 Overactive bladder: Secondary | ICD-10-CM | POA: Diagnosis not present

## 2024-01-08 DIAGNOSIS — R269 Unspecified abnormalities of gait and mobility: Secondary | ICD-10-CM | POA: Diagnosis not present

## 2024-01-08 DIAGNOSIS — R41840 Attention and concentration deficit: Secondary | ICD-10-CM

## 2024-01-08 NOTE — Progress Notes (Signed)
 [facility]  PATIENT: Johnathan Ross DOB: 05/21/1971  REFERRING DOCTOR OR PCP: Leita Elbe, FNP SOURCE: Patient, notes from Dr. Gretel (neurology), multiple MRI reports and images reviewed, lab reports.  _________________________________   HISTORICAL  CHIEF COMPLAINT:  Chief Complaint  Patient presents with   Follow-up    Pt in room 11. Mother in room. Here for MS follow up. Patient reports doing okay. No recent falls. Pt reports no worsening symptoms.    HISTORY OF PRESENT ILLNESS:  Johnathan Ross is a 53 year old man who was diagnosed with relapsing remitting MS around 2007.     Update 01/08/2024 He had his 2 years of Lemtrada  therapy in 2016 and 2017.  He tolerated the infusions well but has had continued worsening of gait and spasticity.  He denies any exacerbation.  However, he does note mild worsening over the past few years with his gait and leg strength.   MRI of the brain 03/14/2022 did not show any new lesions.  No definite exacerbation but he notes some facial dysesthesia.    He had a cardiac arrest.  He has a Statistician    Unit is  [facility] (serial Number [id number]) ICD.  Sees Dr. Inocencio.     His main problem is ambulation.   He uses walls/frniture in his house and walker outside and wheelchair for long distances.    Ampyra  seemed o help at first.  He can go > 100 feet with a walker without a rest and 400 feet or more with breaks.    He has a lot of spasticity and is taking baclofen  up to 9 pills a day (20-30 mg bid to tid so).       He feels a little sleepy on high dose.   In the past he took tizanidine.  Spasticity increases when he uses his walker and lifts up with his arms.  Spasticity is also present at night.   He had a single series of Botox injections in November 2020 but felt weaker without any benefit of his spasticity.      The right leg is mildly weaker than the left leg. . Both legs spasm up, a little worse on the  right than the left of note, the spasticity helps him support his weight when he walks.  Arms are strong.  He uses the walker to walk mostly by lifting with his arms.      He notes mild numbness in his feet. Shifting or lifting leg will help.   There is no dysesthetic pain in the feet.  He also has Type 1 DM in 1995 and has been on insulin .  On the insulin  pump his HgBA1c is usually 7's.  Before it was mostly 11-13.   He denies difficulties with his vision.   He was once on gabapentin  but unsure if it made a difference.  Does not feel discomfort enough to restart.     Bladder function is better since starting Myrbetriq - better than vesicare .  He has urinary urgency but also has some hesitancy and does not completely empty (usually has to go back one or two more times).  Flomax  had not helped much so he stopped.  We also tried Rapaflo  and not sure if helpingany  He sees urology now.   Vision is stable .  He has had laser procedures due to DM  He reports some fatigue.  He snores and wakes up several times at night.  He does not  think he has gasping or snorting.  He has some excessive daytime sleepiness.    He denies much difficulties with cognition.  He denies mood disturbance.    His IDDM is well controlled with HgbA1c=7.3      MS history:  In 2007, he presented with spasms in his left leg.   He saw Dr. Gretel in [place] and was started Rebif and had some progression and also had difficulty tolerating it.    He went on Lemtrada  in 2016 and had his second year in 2017.        Imaging studies: MRIs of the brain and cervical spine from 07/27/2019:  The MRI of the spine shows several T2 hyperintense foci, located posterior laterally bilaterally, right larger than left adjacent to C2-C3, laterally to the left at C3-C4, centrally at C4-C5 also associated with spinal stenosis.  The MRI of the brain shows multiple T2/FLAIR hyperintense foci in the periventricular and juxtacortical white matter and a  couple punctate foci in the cerebellum.  None of the foci appear to be acute.  Other medical history: He has type 1 diabetes mellitus and is on insulin  (pump).  He also has hypothyroidism and is on Synthroid .  He has well-controlled hypertension.  REVIEW OF SYSTEMS: Constitutional: No fevers, chills, sweats, or change in appetite.  He notes some fatigue. Eyes: No visual changes, double vision, eye pain.  He has had laser eye surgery Ear, nose and throat: No hearing loss, ear pain, nasal congestion, sore throat Cardiovascular: No chest pain, palpitations Respiratory:  No shortness of breath at rest or with exertion.   No wheezes GastrointestinaI: No nausea, vomiting, diarrhea, abdominal pain, fecal incontinence Genitourinary:  No dysuria, urinary retention or frequency.  No nocturia. Musculoskeletal:  No neck pain, back pain Integumentary: No rash, pruritus, skin lesions Neurological: as above Psychiatric: No depression at this time.  No anxiety Endocrine: No palpitations, diaphoresis, change in appetite, change in weigh or increased thirst Hematologic/Lymphatic:  No anemia, purpura, petechiae. Allergic/Immunologic: No itchy/runny eyes, nasal congestion, recent allergic reactions, rashes  ALLERGIES: Allergies  Allergen Reactions   Zestril  [Lisinopril ] Cough   Ivp Dye [Iodinated Contrast Media] Hives and Itching    HOME MEDICATIONS:  Current Outpatient Medications:    acetaminophen  (TYLENOL ) 325 MG tablet, Take 1-2 tablets (325-650 mg total) by mouth every 4 (four) hours as needed for mild pain (pain score 1-3)., Disp: , Rfl:    amiodarone  (PACERONE ) 200 MG tablet, Take 1 tablet (200 mg total) by mouth daily., Disp: 90 tablet, Rfl: 0   aspirin  EC 81 MG tablet, Take 1 tablet (81 mg total) by mouth daily. Swallow whole., Disp: , Rfl:    bisoprolol  (ZEBETA ) 5 MG tablet, Take 1 tablet (5 mg total) by mouth daily., Disp: 30 tablet, Rfl: 6   dalfampridine  10 MG TB12, Take 1 tablet (10 mg  total) by mouth 2 (two) times daily., Disp: 180 tablet, Rfl: 3   furosemide  (LASIX ) 20 MG tablet, TAKE 1 TABLET AS NEEDED FOR FLUID OR EDEMA. FOR WEIGHT GAIN OF 3LB IN 24 HOURS OR 5 LB IN A WEEK, Disp: 90 tablet, Rfl: 1   insulin  aspart (NOVOLOG ) 100 UNIT/ML injection, USE UP TO 100 UNITS max dose IN INSULIN  PUMP DAILY-DX CODE E10.8, Disp: 90 mL, Rfl: 1   Insulin  Human (INSULIN  PUMP) SOLN, Inject 1 each into the skin 3 times daily with meals, bedtime and 2 AM., Disp: , Rfl:    levocetirizine (XYZAL ) 5 MG tablet, TAKE 1 TABLET BY  MOUTH EVERY DAY IN THE EVENING, Disp: 90 tablet, Rfl: 1   levothyroxine  (SYNTHROID ) 125 MCG tablet, Take 1 tablet (125 mcg total) by mouth daily at 6 (six) AM., Disp: 90 tablet, Rfl: 0   losartan  (COZAAR ) 50 MG tablet, Take 1 tablet (50 mg total) by mouth daily., Disp: 30 tablet, Rfl: 5   rOPINIRole  (REQUIP ) 0.25 MG tablet, Take 1 tablet (0.25 mg total) by mouth at bedtime., Disp: 90 tablet, Rfl: 0   simvastatin  (ZOCOR ) 20 MG tablet, TAKE 1 TABLET BY MOUTH DAILY AT 6 PM., Disp: 90 tablet, Rfl: 0   vitamin D3 (CHOLECALCIFEROL ) 25 MCG tablet, Take 1 tablet (1,000 Units total) by mouth daily., Disp: 30 tablet, Rfl: 0   MYRBETRIQ 50 MG TB24 tablet, Take 50 mg by mouth daily. (Patient not taking: Reported on 01/08/2024), Disp: , Rfl:   PAST MEDICAL HISTORY: Past Medical History:  Diagnosis Date   Hyperlipidemia    Hypertension    Hypogonadism in male 11/01/2016   Hypothyroidism    Multiple sclerosis (HCC)    Neuromuscular disorder (HCC)    Proliferative diabetic retinopathy(362.02)    Type 1 diabetes mellitus (HCC) dx'd 1994   Vitamin D  insufficiency 11/29/2016    PAST SURGICAL HISTORY: Past Surgical History:  Procedure Laterality Date   EYE SURGERY Bilateral    laser for diabetic retinopathy   FRACTURE SURGERY     ICD IMPLANT N/A 08/30/2023   Procedure: ICD IMPLANT;  Surgeon: Inocencio Soyla Lunger, MD;  Location: [facility];  Service: Cardiovascular;   Laterality: N/A;   IR VENO/EXT/UNI LEFT  03/24/2020   OPEN REDUCTION INTERNAL FIXATION (ORIF) TIBIA/FIBULA FRACTURE Right 10/30/2016   ORIF ANKLE FRACTURE Left 2015   ORIF TIBIA FRACTURE Right 10/30/2016   Procedure: OPEN REDUCTION INTERNAL FIXATION (ORIF) TIBIA FIBULA FRACTURE;  Surgeon: Celena Sharper, MD;  Location: [facility] OR;  Service: Orthopedics;  Laterality: Right;   RETINAL LASER PROCEDURE Bilateral    for diabetic retinopathy   RIGHT/LEFT HEART CATH AND CORONARY ANGIOGRAPHY N/A 08/26/2023   Procedure: RIGHT/LEFT HEART CATH AND CORONARY ANGIOGRAPHY;  Surgeon: Gardenia Led, DO;  Location: [facility];  Service: Cardiovascular;  Laterality: N/A;    FAMILY HISTORY: Family History  Problem Relation Age of Onset   Diabetes Sister    Colon polyps Maternal Uncle    Colon cancer Maternal Grandmother    Stomach cancer Cousin    Esophageal cancer Neg Hx     SOCIAL HISTORY:  Social History   Socioeconomic History   Marital status: Single    Spouse name: 12   Number of children: 1   Years of education: Not on file   Highest education level: Not on file  Occupational History   Occupation: unemployed  Tobacco Use   Smoking status: Never    Passive exposure: Never   Smokeless tobacco: Never  Vaping Use   Vaping status: Never Used  Substance and Sexual Activity   Alcohol use: No   Drug use: No   Sexual activity: Not on file  Other Topics Concern   Not on file  Social History Narrative   Right handed   No caffeine    Lives with girlfriend   Social Drivers of Health   Financial Resource Strain: Low Risk  (09/11/2023)   Overall Financial Resource Strain (CARDIA)    Difficulty of Paying Living Expenses: Not hard at all  Food Insecurity: No Food Insecurity (09/11/2023)   Hunger Vital Sign    Worried About Running Out  of Food in the Last Year: Never true    Ran Out of Food in the Last Year: Never true  Transportation Needs: No Transportation Needs (09/11/2023)    PRAPARE - Administrator, Civil Service (Medical): No    Lack of Transportation (Non-Medical): No  Physical Activity: Sufficiently Active (09/11/2023)   Exercise Vital Sign    Days of Exercise per Week: 5 days    Minutes of Exercise per Session: 30 min  Stress: No Stress Concern Present (09/11/2023)   [employer] of Occupational Health - Occupational Stress Questionnaire    Feeling of Stress : Not at all  Social Connections: Socially Isolated (09/11/2023)   Social Connection and Isolation Panel    Frequency of Communication with Friends and Family: More than three times a week    Frequency of Social Gatherings with Friends and Family: More than three times a week    Attends Religious Services: Never    Database administrator or Organizations: No    Attends Banker Meetings: Never    Marital Status: Never married  Intimate Partner Violence: Not At Risk (09/11/2023)   Humiliation, Afraid, Rape, and Kick questionnaire    Fear of Current or Ex-Partner: No    Emotionally Abused: No    Physically Abused: No    Sexually Abused: No     PHYSICAL EXAM  Vitals:   01/08/24 0931  BP: 122/81  Pulse: 60  SpO2: 98%  Weight: 216 lb (98 kg)  Height: 6' 1 (1.854 m)    Body mass index is 28.5 kg/m.  No results found.  General: The patient is well-developed and well-nourished and in no acute distress   Skin: Extremities are without rash or  edema.  Neurologic Exam  Mental status: The patient is alert and oriented x 3 at the time of the examination. The patient has apparent normal recent and remote memory, with an apparently normal attention span and concentration ability.   Speech is normal.  Cranial nerves: Extraocular movements are full   Color vision was symmetric.  Facial symmetry is present.  Facial strength was normal.  Trapezius strength was normal no obvious hearing deficits are noted.  Motor:  Muscle bulk is normal.   Muscle tone is increased in the  legs.  Strength is 5/5 in the arms except 4+/5 ulnar hand muscles bilaterally.  , 3 to 4 - in the hip flexors, 4 in the leg extenders, 4- in the gastrocnemius muscles and 3  with ankle extensors and toe extensors.  The right was slightly weaker than the left.   Strength is unchanged compared to his last visit.  Sensory: Sensory testing is intact to pinprick, soft touch and vibration sensation in the arms but he has reduced vibration sensation and reduced touch sensation in the feet vibration sensation was just mildly reduced at the ankle.  Other:  Tinels signs at both elbows.    Coordination: Cerebellar testing reveals good finger-nose-finger and unable to do  heel-to-shin bilaterally.  Gait and station: He can rise up from a chair using his arms to support..  Cannot walk without using strong bilateral support.  Reflexes: Deep tendon reflexes are normal in the arms and knees and absent at the toes.  Babinski responses are extensor.    DIAGNOSTIC DATA (LABS, IMAGING, TESTING) - I reviewed patient records, labs, notes, testing and imaging myself where available.  Lab Results  Component Value Date   WBC 8.5 09/10/2023   HGB 13.4  09/10/2023   HCT 40.0 09/10/2023   MCV 87.5 09/10/2023   PLT 293 09/10/2023      Component Value Date/Time   NA 137 12/16/2023 0951   K 4.4 12/16/2023 0951   CL 102 12/16/2023 0951   CO2 28 12/16/2023 0951   GLUCOSE 174 (H) 12/16/2023 0951   GLUCOSE 147 (H) 05/24/2006 1022   BUN 20 12/16/2023 0951   CREATININE 1.31 12/16/2023 0951   CREATININE 1.20 07/08/2023 1106   CALCIUM 9.2 12/16/2023 0951   CALCIUM 8.7 10/31/2016 0415   PROT 6.7 12/06/2023 1241   ALBUMIN 4.0 12/06/2023 1241   AST 27 12/06/2023 1241   ALT 25 12/06/2023 1241   ALKPHOS 78 12/06/2023 1241   BILITOT 0.7 12/06/2023 1241   GFRNONAA >60 12/06/2023 1241   GFRAA 87 07/28/2019 0000   Lab Results  Component Value Date   CHOL 136 07/08/2023   HDL 50 07/08/2023   LDLCALC 72 07/08/2023    LDLDIRECT 65.0 02/09/2021   TRIG 55 08/27/2023   CHOLHDL 2.7 07/08/2023   Lab Results  Component Value Date   HGBA1C 7.9 (H) 12/16/2023   No results found for: VITAMINB12 Lab Results  Component Value Date   TSH 4.671 (H) 12/06/2023       ASSESSMENT AND PLAN  MULTIPLE SCLEROSIS - Plan: MR BRAIN W WO CONTRAST, MR CERVICAL SPINE W WO CONTRAST  Gait disorder - Plan: MR BRAIN W WO CONTRAST, MR CERVICAL SPINE W WO CONTRAST  Attention and concentration deficit  OAB (overactive bladder)   1  .MS is clinically stable since doing 2 years of Lemtrada  in 2016 and 2017, with no exacerbation he has had mild secondary progression with more spasticity in the legs.  His last MRI (02/2022)  showed no new lesions.    Check MRI of the brain and cervical spine to determine if there is breakthrough activity.  If this is occurring we need to start a disease modifying therapy again (consider Mavenclad or Kesimpta - we discussed these) 2.    He is seeing urology and feels bladder issues are stable. 3.   Stay active and walk as tolerated with walker 4.   Continue Ampyra ..   Although his motor exam was similar to the last visit, he has had more difficulty with his gait since he fractured his ankles.  Likely he had deconditioning.   5.   He will return to see us  in 6 months or sooner for new or worsening neurologic symptoms.  This visit is part of a comprehensive longitudinal care medical relationship regarding the patients primary diagnosis of MS and related concerns.   Jadelynn Boylan A. Vear, MD, [facility] 01/08/2024, 12:57 PM Certified in Neurology, Clinical Neurophysiology, Sleep Medicine and Neuroimaging  [facility] [address], [address] [place], [place] [address] [phone]

## 2024-01-10 ENCOUNTER — Other Ambulatory Visit: Payer: Self-pay | Admitting: Internal Medicine

## 2024-01-14 ENCOUNTER — Ambulatory Visit: Admitting: Physical Therapy

## 2024-01-15 ENCOUNTER — Ambulatory Visit (INDEPENDENT_AMBULATORY_CARE_PROVIDER_SITE_OTHER)

## 2024-01-15 DIAGNOSIS — I469 Cardiac arrest, cause unspecified: Secondary | ICD-10-CM

## 2024-01-16 ENCOUNTER — Other Ambulatory Visit

## 2024-01-16 ENCOUNTER — Ambulatory Visit: Payer: Self-pay | Admitting: Cardiology

## 2024-01-16 DIAGNOSIS — E1065 Type 1 diabetes mellitus with hyperglycemia: Secondary | ICD-10-CM | POA: Diagnosis not present

## 2024-01-16 LAB — CUP PACEART REMOTE DEVICE CHECK
Battery Remaining Longevity: 116 mo
Battery Remaining Percentage: 93 %
Battery Voltage: 2.99 V
Brady Statistic RV Percent Paced: 1 %
Date Time Interrogation Session: 20250820020724
HighPow Impedance: 61 Ohm
Implantable Lead Connection Status: 753985
Implantable Lead Implant Date: 20250404
Implantable Lead Location: 753860
Implantable Pulse Generator Implant Date: 20250404
Lead Channel Impedance Value: 360 Ohm
Lead Channel Pacing Threshold Amplitude: 1.25 V
Lead Channel Pacing Threshold Pulse Width: 0.5 ms
Lead Channel Sensing Intrinsic Amplitude: 7.2 mV
Lead Channel Setting Pacing Amplitude: 2.5 V
Lead Channel Setting Pacing Pulse Width: 0.5 ms
Lead Channel Setting Sensing Sensitivity: 0.5 mV
Pulse Gen Serial Number: 211022042
Zone Setting Status: 755011

## 2024-01-17 ENCOUNTER — Ambulatory Visit: Payer: Self-pay | Admitting: Endocrinology

## 2024-01-17 ENCOUNTER — Other Ambulatory Visit

## 2024-01-17 ENCOUNTER — Telehealth: Payer: Self-pay

## 2024-01-17 LAB — HEMOGLOBIN A1C
Hgb A1c MFr Bld: 8 % — ABNORMAL HIGH (ref <5.7)
Mean Plasma Glucose: 183 mg/dL
eAG (mmol/L): 10.1 mmol/L

## 2024-01-17 LAB — BASIC METABOLIC PANEL WITHOUT GFR
BUN/Creatinine Ratio: 14 (calc) (ref 6–22)
BUN: 21 mg/dL (ref 7–25)
CO2: 29 mmol/L (ref 20–32)
Calcium: 9.4 mg/dL (ref 8.6–10.3)
Chloride: 100 mmol/L (ref 98–110)
Creat: 1.45 mg/dL — ABNORMAL HIGH (ref 0.70–1.30)
Glucose, Bld: 199 mg/dL — ABNORMAL HIGH (ref 65–99)
Potassium: 4.3 mmol/L (ref 3.5–5.3)
Sodium: 136 mmol/L (ref 135–146)

## 2024-01-17 NOTE — Telephone Encounter (Signed)
 Copied from CRM [id number]. Topic: Referral - Request for Referral >> Jan 17, 2024  7:54 AM Logan F wrote: Did the patient discuss referral with their provider in the last year? No (If No - schedule appointment) (If Yes - send message)  Appointment offered? Yes- Pt declined appt. Stating it makes no sense to see Dr first when he cannot do anything for the knee  Type of order/referral and detailed reason for visit: Pt is looking for an orthopedic referral. He says his knee gives out sometimes and he twisted it.   Preference of office, provider, location: Does not have a preference   If referral order, have you been seen by this specialty before? No (If Yes, this issue or another issue? When? Where?  Can we respond through MyChart? No

## 2024-01-20 NOTE — Telephone Encounter (Signed)
 Patient states he no longer needs the referral.

## 2024-01-21 ENCOUNTER — Encounter: Payer: Self-pay | Admitting: Physical Therapy

## 2024-01-21 ENCOUNTER — Ambulatory Visit: Admitting: Physical Therapy

## 2024-01-21 DIAGNOSIS — M21372 Foot drop, left foot: Secondary | ICD-10-CM | POA: Diagnosis not present

## 2024-01-21 DIAGNOSIS — R29818 Other symptoms and signs involving the nervous system: Secondary | ICD-10-CM | POA: Diagnosis not present

## 2024-01-21 DIAGNOSIS — M25511 Pain in right shoulder: Secondary | ICD-10-CM | POA: Diagnosis not present

## 2024-01-21 DIAGNOSIS — R2681 Unsteadiness on feet: Secondary | ICD-10-CM

## 2024-01-21 DIAGNOSIS — R262 Difficulty in walking, not elsewhere classified: Secondary | ICD-10-CM

## 2024-01-21 DIAGNOSIS — R29898 Other symptoms and signs involving the musculoskeletal system: Secondary | ICD-10-CM

## 2024-01-21 DIAGNOSIS — R2689 Other abnormalities of gait and mobility: Secondary | ICD-10-CM | POA: Diagnosis not present

## 2024-01-21 DIAGNOSIS — M6281 Muscle weakness (generalized): Secondary | ICD-10-CM

## 2024-01-21 DIAGNOSIS — M21371 Foot drop, right foot: Secondary | ICD-10-CM

## 2024-01-21 DIAGNOSIS — G8929 Other chronic pain: Secondary | ICD-10-CM

## 2024-01-21 NOTE — Therapy (Signed)
 OUTPATIENT PHYSICAL THERAPY NEURO TREATMENT   Patient Name: Johnathan Ross MRN: [id number] DOB:03-12-1971, 53 y.o., male Today's Date: 01/21/2024   PCP: [name], MD  REFERRING PROVIDER: Pegge Toribio PARAS, PA-C  END OF SESSION:  PT End of Session - 01/21/24 1120     Visit Number 14    Number of Visits 16   12 + 4   Date for PT Re-Evaluation 01/31/24   pushed out due to scheduling delay - pt preference   Authorization Type [employer]    Authorization Time Period 10-08-23 - 12-20-23    Authorization - Visit Number 14    Authorization - Number of Visits 16    PT Start Time 1105    PT Stop Time 1151    PT Time Calculation (min) 46 min    Equipment Utilized During Treatment Gait belt    Activity Tolerance Patient tolerated treatment well    Behavior During Therapy [facility] for tasks assessed/performed              Past Medical History:  Diagnosis Date   Hyperlipidemia    Hypertension    Hypogonadism in male 11/01/2016   Hypothyroidism    Multiple sclerosis (HCC)    Neuromuscular disorder (HCC)    Proliferative diabetic retinopathy(362.02)    Type 1 diabetes mellitus (HCC) dx'd 1994   Vitamin D  insufficiency 11/29/2016   Past Surgical History:  Procedure Laterality Date   EYE SURGERY Bilateral    laser for diabetic retinopathy   FRACTURE SURGERY     ICD IMPLANT N/A 08/30/2023   Procedure: ICD IMPLANT;  Surgeon: Inocencio Soyla Lunger, MD;  Location: [facility] IN[facility];  Service: Cardiovascular;  Laterality: N/A;   IR VENO/EXT/UNI LEFT  03/24/2020   OPEN REDUCTION INTERNAL FIXATION (ORIF) TIBIA/FIBULA FRACTURE Right 10/30/2016   ORIF ANKLE FRACTURE Left 2015   ORIF TIBIA FRACTURE Right 10/30/2016   Procedure: OPEN REDUCTION INTERNAL FIXATION (ORIF) TIBIA FIBULA FRACTURE;  Surgeon: Celena Sharper, MD;  Location: [facility] OR;  Service: Orthopedics;  Laterality: Right;   RETINAL LASER PROCEDURE Bilateral    for diabetic retinopathy   RIGHT/LEFT HEART CATH AND  CORONARY ANGIOGRAPHY N/A 08/26/2023   Procedure: RIGHT/LEFT HEART CATH AND CORONARY ANGIOGRAPHY;  Surgeon: Gardenia Led, DO;  Location: [facility];  Service: Cardiovascular;  Laterality: N/A;   Patient Active Problem List   Diagnosis Date Noted   Mass of right lower leg 11/13/2023   Attention and concentration deficit 09/10/2023   Need for immunization against influenza 06/17/2023   Loud snoring 06/17/2023   Encounter for general adult medical examination with abnormal findings 08/02/2022   Screening for prostate cancer 08/01/2021   OAB (overactive bladder) 09/21/2020   Essential hypertension 09/21/2020   Thoracic outlet syndrome 05/03/2020   Controlled type 1 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 (HCC) 03/24/2020   Right epiretinal membrane 03/24/2020   Retinal exudates and deposits 03/24/2020   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   Nuclear sclerotic cataract of both eyes 03/24/2020   Gait disorder 03/23/2020   Benign prostatic hyperplasia without lower urinary tract symptoms 03/01/2020   Diabetic gastroparesis associated with type 1 diabetes mellitus (HCC) 03/01/2020   Gait disturbance 08/19/2019   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/19/2019   Vitamin D  insufficiency 11/29/2016   Hypogonadism in male 11/01/2016   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HCC)    Hypothyroidism    Hypertension    Dyslipidemia, goal LDL below 130    MULTIPLE  SCLEROSIS 08/29/2007   PROLIFERATIVE DIABETIC RETINOPATHY 08/29/2007    ONSET DATE: 08/26/2023  REFERRING DIAG: G93.40 (ICD-10-CM) - Acute encephalopathy  THERAPY DIAG:  Other abnormalities of gait and mobility  Muscle weakness (generalized)  Other symptoms and signs involving the nervous system  Unsteadiness on feet  Other symptoms and signs involving the musculoskeletal system  Difficulty in walking, not elsewhere classified  Foot drop, right  Foot drop, left  Chronic  right shoulder pain  Rationale for Evaluation and Treatment: Rehabilitation  SUBJECTIVE:                                                                                                                                                                                             SUBJECTIVE STATEMENT: Pt amb. To PT today with use of RW - using intermittent reciprocal pattern, having more difficulty today.  He continues to have R knee pain from prior fall and fell again onto both knees this morning getting ready.  He is not wearing AFOs today as some days he just needs a break.  The braces make his outdoor work difficult and he is not using them for this either.  He has not pursued cooling vest beyond filling out paperwork.  He has been wearing compression stockings and reports less pitting edema in ankle and calf (isolated to top of foot at nights).  He reports his walking has been worse the past couples of weeks because his R knee feels unstable like it wants to give out.  Pt accompanied by: self and gf (present during session)  PERTINENT HISTORY: vitamin D  insufficiency, Type 1 IDDM w/ polyneuropathy, diabetic retinopathy, hypothyroidism, HTN, hypercholestrolemia, h/o Rt tibia fracture, h/o Lt ankle fracture with ORIF, MS with acquired diplegia (MS diagnosis 2014/2015)  In the week prior to his cardiac arrest he reported having some near syncopal episodes.  On 3/31 he went into witnessed cardiac arrest w/ bystander performing CPR for 20-25 minutes.  Per EMS notes he had persistent V-fib requiring 8 total defibrillations, 2 doses of epi and some ROSC w/ V tach.  He was also given Narcan in the field due to small pupils.  PAIN:  Are you having pain? Yes: NPRS scale: 4 Pain location: back of right knee Pain description: achy Aggravating factors: standing on RLE Relieving factors: getting off of RLE  PRECAUTIONS: Fall and ICD/Pacemaker (Can raise R shoulder as of May 9th - 6wks post placement of  ICD; monitor BP - if systolic drops below 100 he is to follow-up with Cardiologist about medication management)  RED FLAGS: None   WEIGHT BEARING RESTRICTIONS: No  FALLS: Has patient  fallen in last 6 months? No  LIVING ENVIRONMENT: Lives with: lives with their partner Lives in: House/apartment Stairs: Yes: External: 5 steps; on right going up, on left going up, and can reach both Has following equipment at home: Single point cane, Walker - 2 wheeled, Wheelchair (manual), and shower chair  PLOF: Requires assistive device for independence and Needs assistance with homemaking  PATIENT GOALS: fix my brace and if there's anything we can do for my shoulder   OBJECTIVE:  Note: Objective measures were completed at Evaluation unless otherwise noted.  DIAGNOSTIC FINDINGS:  MRI Cardiac 08/29/2023: IMPRESSION: 1. Mild to moderate decrease in left ventricular systolic function (LVEF =40%).   2. There is no late gadolinium enhancement in the left ventricular myocardium. Normal parametric mapping and tissue characterization.   3. Mild decrease in right ventricular systolic function (RVEF =45%).  CT Head 08/26/2023: IMPRESSION: 1. No evidence of acute intracranial abnormality. 2. White matter lesions better characterized on prior MRI.  CT Angio Chest PE 08/26/2023: IMPRESSION: 1. No pulmonary embolism. 2. Moderate left anterior descending coronary artery calcification. 3. Extensive dependent pulmonary consolidation throughout the lungs which may relate to aspiration or multifocal infection.  *Updated imaging ordered by Dr. Vear (MRI of brain and C-spine)   COGNITION: Overall cognitive status: Within functional limits for tasks assessed   SENSATION: Light touch: WFL   MUSCLE TONE: Increased extensor tone of LLE >RLE but ROM and coordination limited bilaterally due to extensor tone/spasticity    POSTURE: rounded shoulders, forward head, and weight shift right  LOWER EXTREMITY  ROM:   Limited by extensor tone/spasticity   Active  Right Eval Left Eval  Hip flexion    Hip extension    Hip abduction    Hip adduction    Hip internal rotation    Hip external rotation    Knee flexion    Knee extension    Ankle dorsiflexion    Ankle plantarflexion    Ankle inversion    Ankle eversion     (Blank rows = not tested)  LOWER EXTREMITY MMT:  Tested in seated position   MMT Right Eval Left Eval  Hip flexion 1 1  Hip extension    Hip abduction 4- 4-  Hip adduction    Hip internal rotation    Hip external rotation    Knee flexion 2 3-  Knee extension 3+ 1  Ankle dorsiflexion AFO donned AFO donned  Ankle plantarflexion    Ankle inversion    Ankle eversion    (Blank rows = not tested)  BED MOBILITY:  Independent per pt - still in a standard bed, needs increased time to roll L and R  TRANSFERS: Assistive device utilized: Environmental consultant - 2 wheeled  Sit to stand: SBA Stand to sit: SBA Heavy reliance on BUEs to essentially throw himself out of chair, wide BOS, better hip extension today when standing to 2WW, when he lets go he maintains anterior lean in attempt to prevent knees locking out, when knees lock out he loses his balance.   GAIT: Gait pattern: step to pattern, decreased step length- Left, decreased stride length, decreased hip/knee flexion- Right, decreased hip/knee flexion- Left, decreased ankle dorsiflexion- Right, decreased ankle dorsiflexion- Left, lateral hip instability, trunk flexed, narrow BOS, and poor foot clearance- Left Distance walked: Various clinic distances  Assistive device utilized: Walker - 2 wheeled Level of assistance: SBA Comments: Pt required 9 minutes to ambulate into treatment room (~30') , frequently stopping to rest as his legs  lock up. Noted excessive inversion of L ankle as well as scissoring of LLE. Pt relying heavily on BUEs to perform more of a swing-to pattern.   FUNCTIONAL TESTS:                                                                                                                                  TREATMENT: 01/21/24  -Re-assessed R knee - same as prior:  no TTP around tibial landmarks, fibular head of knee joint line or patella/landmarks or around femoral condyles.  No redness or swelling noted.  Most painful in locked out or hyperextended positioning of knee.  New: Open but non-bleeding wound on lower anterior tibia pt reports is from fall this morning - recommending cleaning this regularly.  Ongoing mild lower limb swelling, non-pitting.  Knee appears to had been brushed across but not overtly scraped or open - recommending pt wash this area and moisturize skin for protection and healing.  No bruising noted anywhere on lower portion of limb knee to ankle.  -Discussed his ongoing knee pain and concern for cartilaginous involvement due to mechanism of injury and position of pain provocation - PT concern for meniscus involvement - encouraged pt to reconsider ortho and PCP follow-up though validating his frustration with process of having to see both doctors, but attempted to explain why this is a good process.  He states he will consider this.  PT discussed potential benefit of re-cert next week to incorporate more knee management for a few more visits to lend a hand to referral process and help with pt comfort and function.  He is in agreement with this plan. -PT tried ace wrap on R knee for comfort and to help w/ knee hyperextension control - edu on use at home vs neoprene sleeve for proprioceptive feedback and kinesthetic awareness in addition to general comfort from light compression.  Edu against tight wrapping.  He reports some improved comfort with this.  Discussed AFO roll in hyperextension management vs knee cage or alternative rigid knee bracing options and why PT prefers AFO and ace wrap or neoprene sleeve to these unless ortho MD recommends different management due to injury, etc. -Attempted stairs w/  R loftstrand but pt feeling too unsteady on RLE in stance to successfully push up.  Requires circumduction and heavy momentum to perform step to using forearms on bilateral rails to go up and down stairs facing forwards.  Discussed lateral management w/ pt reporting he has had to do this recently as the left LE has not wanted to bend well.  -Pt ambulates w/ 2WW SBA in and out of clinic today w/ variable gait pattern from paralytic hop-to vs slow reciprocal pattern - great difficulty due to rapid R knee hyperextension pushing hip farther back as well today.  PATIENT EDUCATION: Education details: Please call [facility] and schedule appt. Continue HEP.  Please mail cooling vest application.  Likely re-cert at future visit  to encapsulate work on R knee as this is impacting his gait and stability of late.  Recommending 2-hand support for stairs - possibly laterally, reaching to floor, and walking - do not furniture walk!  Reconsider ortho consult for unchanged R knee pain. Person educated: Patient Education method: Explanation, Demonstration, Tactile cues, Verbal cues, and Handouts Education comprehension: verbalized understanding, returned demonstration, verbal cues required, tactile cues required, and needs further education  HOME EXERCISE PROGRAM:  Access Code: [email or web address] URL: [email or web address] Date: 10/28/2023 Prepared by: Daved Bull  Exercises - Supine Thoracic Mobilization Towel Roll Vertical with Arm Stretch  - 1 x daily - 5 x weekly - 1 sets - 2 minutes hold - Supine Lower Trunk Rotation  - 1 x daily - 5 x weekly - 2 sets - 10 reps - Seated Gastroc Stretch with Strap  - 1 x daily - 7 x weekly - 2 sets - 60 second hold - Standing Gastroc Stretch at Counter  - 2 x daily - 7 x weekly - 1 sets - 3 reps - 30 sec hold - Heel Sits  - 1 x daily - 5 x weekly - 1 sets - 12 reps - Tall Kneeling Diagonal Lift with Medicine Ball  - 1 x daily - 5 x weekly - 1 sets - 5-10 reps -  Marching Bridge  - 1 x daily - 5 x weekly - 1 sets - 6 reps - Clamshell  - 1 x daily - 5 x weekly - 1-2 sets - 10 reps - Long Sitting Hamstring Stretch  - 1 x daily - 5 x weekly - 1 sets - 2-3 reps - 30-45 seconds hold - Seated Knee Flexion  - 1 x daily - 5 x weekly - 1-2 sets - 4 reps - Seated Hip Abduction with Resistance  - 1 x daily - 5 x weekly - 2 sets - 10 reps - Seated Scapular Retraction  - 1 x daily - 7 x weekly - 3 sets - 10 reps - Single Arm Doorway Pec Stretch at 90 Degrees Abduction  - 1 x daily - 7 x weekly - 1 sets - 3-5 reps - 30 sec each hold - Open Book Chest Stretch on Towel Roll  - 1 x daily - 7 x weekly - 1 sets - 10 reps - Supine Shoulder External Rotation Stretch  - 1 x daily - 7 x weekly - 1-2 sets - 10 reps - Supine Shoulder Internal Rotation Stretch  - 1 x daily - 7 x weekly - 1-2 sets - 10 reps  HEP updated:  for shoulder strengthening and stretching Access Code: [email or web address] URL: [email or web address] Date: 11/06/2023 Prepared by: Rock Kussmaul  Exercises - Standing Bilateral Shoulder Internal Rotation AAROM with Dowel  - 1 x daily - 7 x weekly - 3 sets - 10 reps - Standing Shoulder Extension with Dowel  - 1 x daily - 7 x weekly - 3 sets - 10 reps - Standing Overhead Shoulder External Rotation Stretch with Towel  - 1 x daily - 7 x weekly - 3 sets - 10 reps - Corner Pec Major Stretch  - 1 x daily - 7 x weekly - 1 sets - 2-3 reps - 15-20 hold - Doorway Pec Stretch at 120 Degrees Abduction  - 1 x daily - 7 x weekly - 1 sets - 2-3 reps - 15 hold  GOALS: Goals reviewed with patient? Yes  SHORT TERM GOALS: Target date: 11/08/2023  PT will review, condense and modify  HEP to meet current functional level. Baseline: to be reviewed from previous POC Goal status: Goal met 11-07-23   Patient will obtain cooling vest from [employer] to improve ability to continue activity in warmer months. Baseline: PT will fax request to [employer] if pt unable to obtain on  his own.  Pt has not followed up and we will do this next visit (6/16) Goal status: IN PROGRESS    [facility] rep and PT to further assess and adjust left AFO if able to improve seating into brace for improved utilization. Baseline: PT contacted Olivia at [facility] and followed up, pt wants Orthotist to come to clinic so will likely be later appt (6/16) Goal status: IN PROGRESS   LONG TERM GOALS: Target date: 12/06/2023   Patient will tolerate low to moderate level cardio focused exercise for >/=10 minutes w/ RPE of no more than 5/10 in order to demonstrate tolerance to steady state activity. Baseline: 10 minutes RPE 0/10 (7/24) Goal status: MET  Patient will verbalize strategies for energy conservation to improve compliance to safety recommendations. Baseline: Reviewed with teachback (7/24) Goal status: MET  Patient will report no right shoulder pain with light activity in order to improve functional use of UE. Baseline: 1-2/10 w/ repetition; continues to have IR discomfort with movement (7/24) Goal status: NOT MET  4.  Patient will demonstrate unsupported standing x30 seconds without LOB in order to improve upright safety and functional participation in ADLs. Baseline:  BLE lockout w/ LOB; 6 seconds w/ flexed posture (7/24)  Goal status:  IN PROGRESS  5.  Patient will demonstrate reach to floor from standing at no more than supervision level to improve functional upright mobility and independence. Baseline:  Cannot reach to floor without BLE collapsing; minA or BUE assistance for balance (7/24)  Goal status:  NOT MET  GOALS (at 2/75 re-cert): Goals reviewed with patient? Yes  SHORT TERM GOALS = LONG TERM GOALS: Target date: 01/17/2024  Patient will report no right shoulder pain with light activity in order to improve functional use of UE. Baseline: 1-2/10 w/ repetition; continues to have IR discomfort with movement (7/24) Goal status: ONGOING  2.  Patient will demonstrate  unsupported standing x30 seconds without LOB in order to improve upright safety and functional participation in ADLs. Baseline:  BLE lockout w/ LOB; 6 seconds w/ flexed posture (7/24)  Goal status:  IN PROGRESS  3.  Patient will demonstrate reach to floor using no more than single UE support from standing at no more than supervision level to improve functional upright mobility and independence. Baseline:  Cannot reach to floor without BLE collapsing; minA or BUE assistance for balance (7/24)  Goal status:  ONGOING  ASSESSMENT:  CLINICAL IMPRESSION: Emphasis of skilled session on continuing to address R knee pain as it is beginning to worsen his gait mechanics and tolerance to upright mobility.  This was prominent with attempts at reciprocal stepping and stair management today.  He continues to benefit from skilled PT to further address this to prevent regression of gait and stability and reduce excessive reliance on UE to ambulate.  Will continue per POC.  OBJECTIVE IMPAIRMENTS: Abnormal gait, cardiopulmonary status limiting activity, decreased activity tolerance, decreased balance, decreased cognition, decreased coordination, decreased endurance, decreased knowledge of condition, decreased knowledge of use of DME, decreased mobility, difficulty walking, decreased ROM, decreased strength, decreased safety awareness, impaired perceived functional ability, increased muscle spasms, impaired sensation, impaired tone, improper body mechanics, and pain  ACTIVITY LIMITATIONS: carrying,  lifting, bending, standing, squatting, stairs, transfers, reach over head, locomotion level, and caring for others  PARTICIPATION LIMITATIONS: cleaning, driving, shopping, community activity, occupation, and yard work  PERSONAL FACTORS: Behavior pattern, Education, Fitness, Past/current experiences, and 1-2 comorbidities: MS and significant cardiac involvement are also affecting patient's functional outcome.   REHAB  POTENTIAL: Fair see personal factors and PMH  CLINICAL DECISION MAKING: Unstable/unpredictable  EVALUATION COMPLEXITY: High  PLAN:  PT FREQUENCY: 1-2x/week (2x/wk for initial 4 wks and 1x/wk for last 4 wks)  PT DURATION: 8 weeks  PLANNED INTERVENTIONS: [id number]- PT Re-evaluation, 97750- Physical Performance Testing, 97110-Therapeutic exercises, 97530- Therapeutic activity, [id number]- Neuromuscular re-education, 97535- Self Care, [id number]- Manual therapy, [id number]- Gait training, [phone]- Orthotic Initial, [phone]- Orthotic/Prosthetic subsequent, [phone]- Aquatic Therapy, [phone]- Electrical stimulation (manual), Patient/Family education, Balance training, Stair training, Taping, Dry Needling, Joint mobilization, DME instructions, Wheelchair mobility training, Cryotherapy, and Moist heat  PLAN FOR NEXT SESSION:  Paperwork for cooling vest given to pt on 11-21-23 (informed him that we would help to complete if needed);  cont. With cardio activities/exercises  Reaching to floor level.  Standing from low/compliant surfaces - this has recently become difficult for him.  Standing balance. Did he schedule [facility] appt?  Cardiac focus - modified BORG response and circuit style training.   RE-CERT 9/2 for 1x/wk x4-6 wks to further address R knee pain and worsened instability since fall down stairs.  Stairs w/ rail and loftstrand for home as he does not have 2 rails on stairs? - he has loftstrands; reaching to lower heights (<11)  Daved KATHEE Bull, PT, DPT  01/21/2024, 2:51 PM

## 2024-01-23 NOTE — Telephone Encounter (Signed)
 He is scheduled at [facility] on 9/9. Mylene shara: [id number] exp. 01/23/24-03/23/24

## 2024-01-24 ENCOUNTER — Ambulatory Visit: Admitting: Endocrinology

## 2024-01-24 ENCOUNTER — Encounter: Payer: Self-pay | Admitting: Endocrinology

## 2024-01-24 VITALS — BP 118/60 | HR 63 | Resp 20 | Ht 73.0 in | Wt 210.0 lb

## 2024-01-24 DIAGNOSIS — R7989 Other specified abnormal findings of blood chemistry: Secondary | ICD-10-CM | POA: Diagnosis not present

## 2024-01-24 DIAGNOSIS — E063 Autoimmune thyroiditis: Secondary | ICD-10-CM | POA: Diagnosis not present

## 2024-01-24 DIAGNOSIS — E1065 Type 1 diabetes mellitus with hyperglycemia: Secondary | ICD-10-CM | POA: Diagnosis not present

## 2024-01-24 LAB — BASIC METABOLIC PANEL WITH GFR
BUN: 14 mg/dL (ref 7–25)
CO2: 29 mmol/L (ref 20–32)
Calcium: 9.2 mg/dL (ref 8.6–10.3)
Chloride: 103 mmol/L (ref 98–110)
Creat: 1.24 mg/dL (ref 0.70–1.30)
Glucose, Bld: 116 mg/dL — ABNORMAL HIGH (ref 65–99)
Potassium: 4.8 mmol/L (ref 3.5–5.3)
Sodium: 139 mmol/L (ref 135–146)
eGFR: 70 mL/min/1.73m2 (ref >=60)

## 2024-01-24 LAB — T4, FREE: Free T4: 1.5 ng/dL (ref 0.8–1.8)

## 2024-01-24 LAB — TSH: TSH: 5.29 m[IU]/L — ABNORMAL HIGH (ref 0.40–4.50)

## 2024-01-24 NOTE — Progress Notes (Signed)
 Outpatient Endocrinology Note [place] Joron Velis, MD  01/24/24  Patient's Name: Johnathan Ross    DOB: 10-26-1970    MRN: [id number]                                                    REASON OF VISIT: Follow up for type 1 diabetes mellitus  PCP: [name], MD  HISTORY OF PRESENT ILLNESS:   Johnathan Ross is a 53 y.o. old male with past medical history listed below, is here for follow up of type 1 diabetes mellitus and hypothyroidism.  Pertinent Diabetes History: Patient was previously seen by Dr. Von and was last time seen in June 2024. _Diagnosed as diabetes melitis type 1 in 1995.  He has been on insulin  pump therapy since 2012.  On Medtronic 780G since September 2023.  Chronic Diabetes Complications : Retinopathy: yes. Last ophthalmology exam was done on every 6 months  Nephropathy: no, on losartan  Peripheral neuropathy: yes, from MS, on gabapentin  Coronary artery disease: no Stroke: no  Relevant comorbidities and cardiovascular risk factors: Obesity: no Body mass index is 27.71 kg/m.  Hypertension: yes Hyperlipidemia. Yes, on statin.   Current / Home Diabetic regimen includes:  Medtronic 780G using NovoLog  U100  Insulin  Pump setting:  Basal ( 21.275 units/day) MN- 0.075 u/hour 4AM- 1.25  6AM- 1.05 9AM- 1.25 9 PM-   0.125 10PM-  0.100  Bolus CHO Ratio (1unit:CHO) MN- 1:15 7AM- 1:8 12PM- 1:8.5 6PM- 1:12 10PM-  1:20  Correction/Sensitivity: MN- 1:35  Target: 130  Active insulin  time: 2 hours  Prior diabetic medications:   CONTINUOUS GLUCOSE MONITORING SYSTEM (CGMS) / INSULIN  PUMP INTERPRETATION:                     Medtronic Pump & Sensor Download (Reviewed and summarized below.) Medtronic 780G Date: August 15 to August 29 , 2025, 14 day GMI : 6.7% Average glucose : 119 Smart guard mode : 98% Sensor usage : 95% Total daily dose 76 units, basal 41%, bolus 59%    Previous:    Interpretation: Mostly acceptable blood sugar with  occasional hyperglycemia postprandially with blood sugar up to 250 range especially with supper causing hyperglycemia overnight.  Will reorder mild hypoglycemia related to mealtime insulin  and correctional bolus insulin .  No concerning hypoglycemia.  Hypoglycemia: Patient has no concerning hypoglycemic episodes. Patient has hypoglycemia awareness.    Factors modifying glucose control: 1.  Diabetic diet assessment: 3 meals a day.  2.  Staying active or exercising: Not able to exercise.  3.  Medication compliance: compliant all of the time.  # Primary hypothyroidism  -Longstanding history of primary 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 autoimmune.  He was only taking levothyroxine  / synthroid  125 mcg 6 days a week.  # He has multiple sclerosis, following with neurology.  # Patient had V-fib cardiac arrest in March 2025, following with cardiology, on amiodarone  and beta-blocker.  Interval history  Insulin  pump and CGM data as reviewed above.  GMI on CGM 6.7%.  Recent hemoglobin A1c 8.0%.  Patient lab results reviewed electrolytes normal.  He has elevated creatinine of 1.45.  He takes furosemide  as needed, he thinks he may have taken furosemide  on that day when he had a lab work on August 21.  He had V-fib cardiac arrest in March, actively following with cardiology at this  time.  He is currently on amiodarone .  He has been taking Synthroid /levothyroxine  112 mcg daily.  He used to take 6 days a week in the past.  He reports after his hospitalization in March/April time he has been taking levothyroxine  daily.  In May he had normal TSH of 2.3.  However on December 06, 2023 he had abnormal thyroid  hormone levels including mildly elevated TSH and elevated free T4 as follows.   Latest Reference Range & Units 10/15/23 09:45 12/06/23 12:41  TSH 0.350 - 4.500 uIU/mL 2.310 4.671 (H)  T4,Free(Direct) 0.61 - 1.12 ng/dL  8.77 (H)  (H): Data is abnormally high  REVIEW OF SYSTEMS As per history of present illness.    PAST MEDICAL HISTORY: Past Medical History:  Diagnosis Date   Hyperlipidemia    Hypertension    Hypogonadism in male 11/01/2016   Hypothyroidism    Multiple sclerosis (HCC)    Neuromuscular disorder (HCC)    Proliferative diabetic retinopathy(362.02)    Type 1 diabetes mellitus (HCC) dx'd 1994   Vitamin D  insufficiency 11/29/2016    PAST SURGICAL HISTORY: Past Surgical History:  Procedure Laterality Date   EYE SURGERY Bilateral    laser for diabetic retinopathy   FRACTURE SURGERY     ICD IMPLANT N/A 08/30/2023   Procedure: ICD IMPLANT;  Surgeon: Inocencio Soyla Lunger, MD;  Location: [facility];  Service: Cardiovascular;  Laterality: N/A;   IR VENO/EXT/UNI LEFT  03/24/2020   OPEN REDUCTION INTERNAL FIXATION (ORIF) TIBIA/FIBULA FRACTURE Right 10/30/2016   ORIF ANKLE FRACTURE Left 2015   ORIF TIBIA FRACTURE Right 10/30/2016   Procedure: OPEN REDUCTION INTERNAL FIXATION (ORIF) TIBIA FIBULA FRACTURE;  Surgeon: Celena Sharper, MD;  Location: [facility] OR;  Service: Orthopedics;  Laterality: Right;   RETINAL LASER PROCEDURE Bilateral    for diabetic retinopathy   RIGHT/LEFT HEART CATH AND CORONARY ANGIOGRAPHY N/A 08/26/2023   Procedure: RIGHT/LEFT HEART CATH AND CORONARY ANGIOGRAPHY;  Surgeon: Gardenia Led, DO;  Location: [facility];  Service: Cardiovascular;  Laterality: N/A;    ALLERGIES: Allergies  Allergen Reactions   Zestril  [Lisinopril ] Cough   Ivp Dye [Iodinated Contrast Media] Hives and Itching    FAMILY HISTORY:  Family History  Problem Relation Age of Onset   Diabetes Sister    Colon polyps Maternal Uncle    Colon cancer Maternal Grandmother    Stomach cancer Cousin    Esophageal cancer Neg Hx     SOCIAL HISTORY: Social History   Socioeconomic History   Marital status: Single    Spouse name: 12   Number of children: 1   Years of education: Not on file   Highest education level: Not on file  Occupational History   Occupation: unemployed   Tobacco Use   Smoking status: Never    Passive exposure: Never   Smokeless tobacco: Never  Vaping Use   Vaping status: Never Used  Substance and Sexual Activity   Alcohol use: No   Drug use: No   Sexual activity: Not on file  Other Topics Concern   Not on file  Social History Narrative   Right handed   No caffeine    Lives with girlfriend   Social Drivers of Health   Financial Resource Strain: Low Risk  (09/11/2023)   Overall Financial Resource Strain (CARDIA)    Difficulty of Paying Living Expenses: Not hard at all  Food Insecurity: No Food Insecurity (09/11/2023)   Hunger Vital Sign    Worried About Running Out of  Food in the Last Year: Never true    Ran Out of Food in the Last Year: Never true  Transportation Needs: No Transportation Needs (09/11/2023)   PRAPARE - Administrator, Civil Service (Medical): No    Lack of Transportation (Non-Medical): No  Physical Activity: Sufficiently Active (09/11/2023)   Exercise Vital Sign    Days of Exercise per Week: 5 days    Minutes of Exercise per Session: 30 min  Stress: No Stress Concern Present (09/11/2023)   [employer] of Occupational Health - Occupational Stress Questionnaire    Feeling of Stress : Not at all  Social Connections: Socially Isolated (09/11/2023)   Social Connection and Isolation Panel    Frequency of Communication with Friends and Family: More than three times a week    Frequency of Social Gatherings with Friends and Family: More than three times a week    Attends Religious Services: Never    Database administrator or Organizations: No    Attends Engineer, structural: Never    Marital Status: Never married    MEDICATIONS:  Current Outpatient Medications  Medication Sig Dispense Refill   acetaminophen  (TYLENOL ) 325 MG tablet Take 1-2 tablets (325-650 mg total) by mouth every 4 (four) hours as needed for mild pain (pain score 1-3).     amiodarone  (PACERONE ) 200 MG tablet Take 1  tablet (200 mg total) by mouth daily. 90 tablet 0   aspirin  EC 81 MG tablet Take 1 tablet (81 mg total) by mouth daily. Swallow whole.     bisoprolol  (ZEBETA ) 5 MG tablet Take 1 tablet (5 mg total) by mouth daily. 30 tablet 6   dalfampridine  10 MG TB12 Take 1 tablet (10 mg total) by mouth 2 (two) times daily. 180 tablet 3   furosemide  (LASIX ) 20 MG tablet TAKE 1 TABLET AS NEEDED FOR FLUID OR EDEMA. FOR WEIGHT GAIN OF 3LB IN 24 HOURS OR 5 LB IN A WEEK 90 tablet 1   insulin  aspart (NOVOLOG ) 100 UNIT/ML injection USE UP TO 100 UNITS max dose IN INSULIN  PUMP DAILY-DX CODE E10.8 90 mL 1   Insulin  Human (INSULIN  PUMP) SOLN Inject 1 each into the skin 3 times daily with meals, bedtime and 2 AM.     levocetirizine (XYZAL ) 5 MG tablet TAKE 1 TABLET BY MOUTH EVERY DAY IN THE EVENING 90 tablet 1   levothyroxine  (SYNTHROID ) 125 MCG tablet Take 1 tablet (125 mcg total) by mouth daily at 6 (six) AM. 90 tablet 0   losartan  (COZAAR ) 50 MG tablet Take 1 tablet (50 mg total) by mouth daily. 30 tablet 5   MYRBETRIQ 50 MG TB24 tablet Take 50 mg by mouth daily.     rOPINIRole  (REQUIP ) 0.25 MG tablet TAKE 1 TABLET BY MOUTH AT BEDTIME. 90 tablet 1   simvastatin  (ZOCOR ) 20 MG tablet TAKE 1 TABLET BY MOUTH DAILY AT 6 PM. 90 tablet 0   vitamin D3 (CHOLECALCIFEROL ) 25 MCG tablet Take 1 tablet (1,000 Units total) by mouth daily. 30 tablet 0   No current facility-administered medications for this visit.    PHYSICAL EXAM: Vitals:   01/24/24 1104  BP: 118/60  Pulse: 63  Resp: 20  SpO2: 97%  Weight: 210 lb (95.3 kg)  Height: 6' 1 (1.854 m)    Body mass index is 27.71 kg/m.  Wt Readings from Last 3 Encounters:  01/24/24 210 lb (95.3 kg)  01/08/24 216 lb (98 kg)  12/16/23 213 lb 6.4 oz (96.8  kg)    General: Well developed, well nourished male in no apparent distress.  On wheelchair. HEENT: AT/[place], no external lesions.  Eyes: Conjunctiva clear and no icterus. Neurologic: Alert, oriented, normal  speech Psychiatric: Does not appear depressed or anxious  Diabetic Foot Exam - Simple   No data filed    LABS Reviewed Lab Results  Component Value Date   HGBA1C 8.0 (H) 01/16/2024   HGBA1C 7.9 (H) 12/16/2023   HGBA1C 7.3 (H) 07/08/2023   Lab Results  Component Value Date   FRUCTOSAMINE 303 (H) 07/08/2023   FRUCTOSAMINE 383 (H) 12/03/2019   FRUCTOSAMINE 355 (H) 08/27/2018   Lab Results  Component Value Date   CHOL 136 07/08/2023   HDL 50 07/08/2023   LDLCALC 72 07/08/2023   LDLDIRECT 65.0 02/09/2021   TRIG 55 08/27/2023   CHOLHDL 2.7 07/08/2023   Lab Results  Component Value Date   MICRALBCREAT Unable to calculate 12/16/2023   MICRALBCREAT 31.3 (H) 12/27/2008   Lab Results  Component Value Date   CREATININE 1.45 (H) 01/16/2024   Lab Results  Component Value Date   GFR 63.09 12/16/2023    ASSESSMENT / PLAN  1. Uncontrolled type 1 diabetes mellitus with hyperglycemia (HCC)   2. Acquired autoimmune hypothyroidism   3. Elevated serum creatinine     Diabetes Mellitus type 1, complicated by diabetic retinopathy, possibly neuropathy. - Diabetic status / severity: uncontrolled.  Lab Results  Component Value Date   HGBA1C 8.0 (H) 01/16/2024    - Hemoglobin A1c goal <6.5%   GMI on CGM 6.7%.  Hemoglobin A1c 8% however he is GMI on CGM is acceptable.  CGM data as reviewed above. . - Medications:  Insulin  pump setting changed as follows.  He has occasional overnight hyperglycemia postprandially, change carb ratio for supper and bedtime as follows.  Medtronic 780G using NovoLog  U100  Insulin  Pump setting:  Basal ( 21.275 units/day) MN- 0.075 u/hour 4AM- 1.25  6AM- 1.05 9AM- 1.25 9 PM-   0.125 10PM-  0.100  Bolus CHO Ratio (1unit:CHO) MN- 1:15 7AM- 1:8 12PM- 1:8.5 6PM- 1:12, changed to 1:10 10PM-  1:20, changed to 1:15  Correction/Sensitivity: MN- 1:35  Target: 130  Active insulin  time: 2 hours  - Home glucose testing: continue CGM and check  blood glucose as needed.  - Discussed/ Gave Hypoglycemia treatment plan.  # Consult : not required at this time.   # Annual urine for microalbuminuria/ creatinine ratio, no microalbuminuria currently, continue ACE/ARB /losartan .   Last  Lab Results  Component Value Date   MICRALBCREAT Unable to calculate 12/16/2023    # Foot check nightly / neuropathy, continue gabapentin .  Managed by neurology/PCP.  # He has proliferative diabetic retinopathy, regularly following with ophthalmology..   - Diet: Make healthy diabetic food choices.  Discussed about reducing carb content in the meals.  2. Blood pressure  -  BP Readings from Last 1 Encounters:  01/24/24 118/60    - Control is in target.  - No change in current plans.  3. Lipid status / Hyperlipidemia - Last  Lab Results  Component Value Date   LDLCALC 72 07/08/2023   - Continue simvastatin  20 mg daily.  # Primary hypothyroidism - Currently taking levothyroxine /Synthroid  125 mcg daily.  Thyroid  function test in July was abnormal with elevated TSH and elevated free T4.  He is currently on amiodarone  since March after he had V-fib cardiac arrest.  He is actively following with cardiology. - I would like to check thyroid   function test today.  # He had elevated creatinine on recent lab.  Will recheck BMP with eGFR today.  He has been taking furosemide  as needed could be related to taking diuretic as well. -Advised to discuss with cardiology on coming follow-up visit in September 8. -If the creatinine stays high, consider nephrology referral.  # He prefers to have lab visit prior to follow-up visit.  Diagnoses and all orders for this visit:  Uncontrolled type 1 diabetes mellitus with hyperglycemia (HCC) -     Basic metabolic panel with GFR -     Basic metabolic panel with GFR -     Hemoglobin A1c -     Lipid panel  Acquired autoimmune hypothyroidism -     T4, free -     TSH -     T4, free -     TSH  Elevated serum  creatinine   DISPOSITION Follow up in clinic in 3 months suggested.  He prefers to have video virtual visit.  Labs today and labs as order prior to follow-up visit.   All questions answered and patient verbalized understanding of the plan.  [place] Arshan Jabs, MD [facility] Endocrinology [facility] [address] [place], [address] [place], [place] [address] Phone # [phone]  At least part of this note was generated using voice recognition software. Inadvertent word errors may have occurred, which were not recognized during the proofreading process.

## 2024-01-28 ENCOUNTER — Encounter: Payer: Self-pay | Admitting: Physical Therapy

## 2024-01-28 ENCOUNTER — Ambulatory Visit: Payer: Self-pay | Admitting: Endocrinology

## 2024-01-28 ENCOUNTER — Ambulatory Visit: Attending: Physician Assistant | Admitting: Physical Therapy

## 2024-01-28 ENCOUNTER — Other Ambulatory Visit: Payer: Self-pay | Admitting: Endocrinology

## 2024-01-28 DIAGNOSIS — M25511 Pain in right shoulder: Secondary | ICD-10-CM | POA: Insufficient documentation

## 2024-01-28 DIAGNOSIS — R2689 Other abnormalities of gait and mobility: Secondary | ICD-10-CM | POA: Diagnosis not present

## 2024-01-28 DIAGNOSIS — G8929 Other chronic pain: Secondary | ICD-10-CM | POA: Insufficient documentation

## 2024-01-28 DIAGNOSIS — M21371 Foot drop, right foot: Secondary | ICD-10-CM | POA: Diagnosis not present

## 2024-01-28 DIAGNOSIS — M21372 Foot drop, left foot: Secondary | ICD-10-CM | POA: Insufficient documentation

## 2024-01-28 DIAGNOSIS — E063 Autoimmune thyroiditis: Secondary | ICD-10-CM

## 2024-01-28 DIAGNOSIS — R2681 Unsteadiness on feet: Secondary | ICD-10-CM | POA: Diagnosis not present

## 2024-01-28 DIAGNOSIS — R29898 Other symptoms and signs involving the musculoskeletal system: Secondary | ICD-10-CM | POA: Diagnosis not present

## 2024-01-28 DIAGNOSIS — M6281 Muscle weakness (generalized): Secondary | ICD-10-CM | POA: Diagnosis not present

## 2024-01-28 DIAGNOSIS — R262 Difficulty in walking, not elsewhere classified: Secondary | ICD-10-CM | POA: Diagnosis not present

## 2024-01-28 DIAGNOSIS — R29818 Other symptoms and signs involving the nervous system: Secondary | ICD-10-CM | POA: Insufficient documentation

## 2024-01-28 MED ORDER — LEVOTHYROXINE SODIUM 137 MCG PO TABS
137.0000 ug | ORAL_TABLET | Freq: Every day | ORAL | 3 refills | Status: AC
Start: 2024-01-28 — End: ?

## 2024-01-28 NOTE — Therapy (Signed)
 OUTPATIENT PHYSICAL THERAPY NEURO TREATMENT   Patient Name: Johnathan Ross MRN: [id number] DOB:04/13/1971, 53 y.o., male Today's Date: 01/28/2024   PCP: [name], MD  REFERRING PROVIDER: Pegge Toribio PARAS, PA-C  END OF SESSION:  PT End of Session - 01/28/24 1104     Visit Number 15    Number of Visits 22   16+6   Date for PT Re-Evaluation 03/27/24   pushed out due to scheduling delay at re-cert   Authorization Type [employer]    Authorization Time Period 10-08-23 - 12-20-23; 4 more visits 12/19/23 - 01/31/24    Authorization - Visit Number 15    Authorization - Number of Visits 16    Progress Note Due on Visit 20    PT Start Time 1101    PT Stop Time 1145    PT Time Calculation (min) 44 min    Equipment Utilized During Treatment Gait belt    Activity Tolerance Patient tolerated treatment well    Behavior During Therapy [facility] for tasks assessed/performed              Past Medical History:  Diagnosis Date   Hyperlipidemia    Hypertension    Hypogonadism in male 11/01/2016   Hypothyroidism    Multiple sclerosis (HCC)    Neuromuscular disorder (HCC)    Proliferative diabetic retinopathy(362.02)    Type 1 diabetes mellitus (HCC) dx'd 1994   Vitamin D  insufficiency 11/29/2016   Past Surgical History:  Procedure Laterality Date   EYE SURGERY Bilateral    laser for diabetic retinopathy   FRACTURE SURGERY     ICD IMPLANT N/A 08/30/2023   Procedure: ICD IMPLANT;  Surgeon: Inocencio Soyla Lunger, MD;  Location: [facility];  Service: Cardiovascular;  Laterality: N/A;   IR VENO/EXT/UNI LEFT  03/24/2020   OPEN REDUCTION INTERNAL FIXATION (ORIF) TIBIA/FIBULA FRACTURE Right 10/30/2016   ORIF ANKLE FRACTURE Left 2015   ORIF TIBIA FRACTURE Right 10/30/2016   Procedure: OPEN REDUCTION INTERNAL FIXATION (ORIF) TIBIA FIBULA FRACTURE;  Surgeon: Celena Sharper, MD;  Location: [facility] OR;  Service: Orthopedics;  Laterality: Right;   RETINAL LASER PROCEDURE Bilateral     for diabetic retinopathy   RIGHT/LEFT HEART CATH AND CORONARY ANGIOGRAPHY N/A 08/26/2023   Procedure: RIGHT/LEFT HEART CATH AND CORONARY ANGIOGRAPHY;  Surgeon: Gardenia Led, DO;  Location: [facility];  Service: Cardiovascular;  Laterality: N/A;   Patient Active Problem List   Diagnosis Date Noted   Mass of right lower leg 11/13/2023   Attention and concentration deficit 09/10/2023   Need for immunization against influenza 06/17/2023   Loud snoring 06/17/2023   Encounter for general adult medical examination with abnormal findings 08/02/2022   Screening for prostate cancer 08/01/2021   OAB (overactive bladder) 09/21/2020   Essential hypertension 09/21/2020   Thoracic outlet syndrome 05/03/2020   Controlled type 1 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 (HCC) 03/24/2020   Right epiretinal membrane 03/24/2020   Retinal exudates and deposits 03/24/2020   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   Nuclear sclerotic cataract of both eyes 03/24/2020   Gait disorder 03/23/2020   Benign prostatic hyperplasia without lower urinary tract symptoms 03/01/2020   Diabetic gastroparesis associated with type 1 diabetes mellitus (HCC) 03/01/2020   Gait disturbance 08/19/2019   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/19/2019   Vitamin D  insufficiency 11/29/2016   Hypogonadism in male 11/01/2016   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HCC)    Hypothyroidism    Hypertension  Dyslipidemia, goal LDL below 130    MULTIPLE SCLEROSIS 08/29/2007   PROLIFERATIVE DIABETIC RETINOPATHY 08/29/2007    ONSET DATE: 08/26/2023  REFERRING DIAG: G93.40 (ICD-10-CM) - Acute encephalopathy  THERAPY DIAG:  Other abnormalities of gait and mobility - Plan: PT plan of care cert/re-cert  Muscle weakness (generalized) - Plan: PT plan of care cert/re-cert  Other symptoms and signs involving the nervous system - Plan: PT plan of care cert/re-cert  Unsteadiness on feet  - Plan: PT plan of care cert/re-cert  Other symptoms and signs involving the musculoskeletal system - Plan: PT plan of care cert/re-cert  Difficulty in walking, not elsewhere classified - Plan: PT plan of care cert/re-cert  Foot drop, right - Plan: PT plan of care cert/re-cert  Foot drop, left - Plan: PT plan of care cert/re-cert  Chronic right shoulder pain - Plan: PT plan of care cert/re-cert  Rationale for Evaluation and Treatment: Rehabilitation  SUBJECTIVE:                                                                                                                                                                                             SUBJECTIVE STATEMENT: Pt ambulates into gym today with 2WW w/ improved reciprocal pattern intermittently using paralytic gait pattern to move out of way of traffic.  He reports he can feel it in his R knee with steps and he is continuing to be cautious because it seems like it wants to give out more.  He states noting increased sense of instability with furniture walking than he did before his fall down the steps.  He is wearing both AFOs today.  He had 2 falls since last visit due to R knee giving out - he landed on his R side with one of the falls and reports he hit his head on the wall with the other fall.  He did not follow-up with MD.  Pt accompanied by: self and gf (present during session)  PERTINENT HISTORY: vitamin D  insufficiency, Type 1 IDDM w/ polyneuropathy, diabetic retinopathy, hypothyroidism, HTN, hypercholestrolemia, h/o Rt tibia fracture, h/o Lt ankle fracture with ORIF, MS with acquired diplegia (MS diagnosis 2014/2015)  In the week prior to his cardiac arrest he reported having some near syncopal episodes.  On 3/31 he went into witnessed cardiac arrest w/ bystander performing CPR for 20-25 minutes.  Per EMS notes he had persistent V-fib requiring 8 total defibrillations, 2 doses of epi and some ROSC w/ V tach.  He was also given  Narcan in the field due to small pupils.  PAIN:  Are you having pain? Yes: NPRS scale: 2 Pain location:  back of right knee Pain description: achy Aggravating factors: standing on RLE Relieving factors: getting off of RLE  PRECAUTIONS: Fall and ICD/Pacemaker (Can raise R shoulder as of May 9th - 6wks post placement of ICD; monitor BP - if systolic drops below 100 he is to follow-up with Cardiologist about medication management)  RED FLAGS: None   WEIGHT BEARING RESTRICTIONS: No  FALLS: Has patient fallen in last 6 months? No  LIVING ENVIRONMENT: Lives with: lives with their partner Lives in: House/apartment Stairs: Yes: External: 5 steps; on right going up, on left going up, and can reach both Has following equipment at home: Single point cane, Walker - 2 wheeled, Wheelchair (manual), and shower chair  PLOF: Requires assistive device for independence and Needs assistance with homemaking  PATIENT GOALS: fix my brace and if there's anything we can do for my shoulder   OBJECTIVE:  Note: Objective measures were completed at Evaluation unless otherwise noted.  DIAGNOSTIC FINDINGS:  MRI Cardiac 08/29/2023: IMPRESSION: 1. Mild to moderate decrease in left ventricular systolic function (LVEF =40%).   2. There is no late gadolinium enhancement in the left ventricular myocardium. Normal parametric mapping and tissue characterization.   3. Mild decrease in right ventricular systolic function (RVEF =45%).  CT Head 08/26/2023: IMPRESSION: 1. No evidence of acute intracranial abnormality. 2. White matter lesions better characterized on prior MRI.  CT Angio Chest PE 08/26/2023: IMPRESSION: 1. No pulmonary embolism. 2. Moderate left anterior descending coronary artery calcification. 3. Extensive dependent pulmonary consolidation throughout the lungs which may relate to aspiration or multifocal infection.  *Updated imaging ordered by Dr. Vear (MRI of brain and C-spine)    COGNITION: Overall cognitive status: Within functional limits for tasks assessed   SENSATION: Light touch: WFL   MUSCLE TONE: Increased extensor tone of LLE >RLE but ROM and coordination limited bilaterally due to extensor tone/spasticity    POSTURE: rounded shoulders, forward head, and weight shift right  LOWER EXTREMITY ROM:   Limited by extensor tone/spasticity   Active  Right Eval Left Eval  Hip flexion    Hip extension    Hip abduction    Hip adduction    Hip internal rotation    Hip external rotation    Knee flexion    Knee extension    Ankle dorsiflexion    Ankle plantarflexion    Ankle inversion    Ankle eversion     (Blank rows = not tested)  LOWER EXTREMITY MMT:  Tested in seated position   MMT Right Eval Left Eval  Hip flexion 1 1  Hip extension    Hip abduction 4- 4-  Hip adduction    Hip internal rotation    Hip external rotation    Knee flexion 2 3-  Knee extension 3+ 1  Ankle dorsiflexion AFO donned AFO donned  Ankle plantarflexion    Ankle inversion    Ankle eversion    (Blank rows = not tested)  BED MOBILITY:  Independent per pt - still in a standard bed, needs increased time to roll L and R  TRANSFERS: Assistive device utilized: Environmental consultant - 2 wheeled  Sit to stand: SBA Stand to sit: SBA Heavy reliance on BUEs to essentially throw himself out of chair, wide BOS, better hip extension today when standing to 2WW, when he lets go he maintains anterior lean in attempt to prevent knees locking out, when knees lock out he loses his balance.   GAIT: Gait pattern: step to pattern, decreased step length-  Left, decreased stride length, decreased hip/knee flexion- Right, decreased hip/knee flexion- Left, decreased ankle dorsiflexion- Right, decreased ankle dorsiflexion- Left, lateral hip instability, trunk flexed, narrow BOS, and poor foot clearance- Left Distance walked: Various clinic distances  Assistive device utilized: Walker - 2  wheeled Level of assistance: SBA Comments: Pt required 9 minutes to ambulate into treatment room (~30') , frequently stopping to rest as his legs lock up. Noted excessive inversion of L ankle as well as scissoring of LLE. Pt relying heavily on BUEs to perform more of a swing-to pattern.   FUNCTIONAL TESTS:                                                                                                                                 TREATMENT: 01/28/24  Self-Care: -Discussed following up with orthopedic doctor regarding R knee instability causing falls -Discussed following up with PCP regarding referral to ortho and follow-up regarding hitting head as he is on blood thinners - emphasized importance of safety with respect to this.  PT offered to message PCP on behalf of pt and pt is requesting PT hold off on this as he is interested in trying to get in with ortho first. -Discussed cooling vest - he mailed papers in last Thursday  Assessed goals for re-cert as below.  In // bars: -Wide lateral weight shifts x2 minutes progressing to unsupported -R rear stride weight shifts x4 minutes working on maintaining upright posture, limiting RLE locking into extension and progression away from UE support -LLE on airex in slightly widened BOS:  unsupported forward and overhead (limited) UE lifts x2 minutes > lateral reach trying to shift LE weight x2-3 minutes w/ intermittent LOB using UE to catch himself/CGA  PATIENT EDUCATION: Education details: Please call [facility] and schedule appt. Continue HEP.  Re-cert to encapsulate work on R knee as this is impacting his gait and stability of late.  Recommending 2-hand support for stairs - possibly laterally, reaching to floor, and walking - do not furniture walk!  See above for further.  Progress towards goals. Person educated: Patient Education method: Explanation, Demonstration, Tactile cues, Verbal cues, and Handouts Education comprehension: verbalized  understanding, returned demonstration, verbal cues required, tactile cues required, and needs further education  HOME EXERCISE PROGRAM:  Access Code: [email or web address] URL: [email or web address] Date: 10/28/2023 Prepared by: Daved Bull  Exercises - Supine Thoracic Mobilization Towel Roll Vertical with Arm Stretch  - 1 x daily - 5 x weekly - 1 sets - 2 minutes hold - Supine Lower Trunk Rotation  - 1 x daily - 5 x weekly - 2 sets - 10 reps - Seated Gastroc Stretch with Strap  - 1 x daily - 7 x weekly - 2 sets - 60 second hold - Standing Gastroc Stretch at Counter  - 2 x daily - 7 x weekly - 1 sets - 3 reps - 30 sec hold - Heel Sits  - 1 x daily -  5 x weekly - 1 sets - 12 reps - Tall Kneeling Diagonal Lift with Medicine Ball  - 1 x daily - 5 x weekly - 1 sets - 5-10 reps - Marching Bridge  - 1 x daily - 5 x weekly - 1 sets - 6 reps - Clamshell  - 1 x daily - 5 x weekly - 1-2 sets - 10 reps - Long Sitting Hamstring Stretch  - 1 x daily - 5 x weekly - 1 sets - 2-3 reps - 30-45 seconds hold - Seated Knee Flexion  - 1 x daily - 5 x weekly - 1-2 sets - 4 reps - Seated Hip Abduction with Resistance  - 1 x daily - 5 x weekly - 2 sets - 10 reps - Seated Scapular Retraction  - 1 x daily - 7 x weekly - 3 sets - 10 reps - Single Arm Doorway Pec Stretch at 90 Degrees Abduction  - 1 x daily - 7 x weekly - 1 sets - 3-5 reps - 30 sec each hold - Open Book Chest Stretch on Towel Roll  - 1 x daily - 7 x weekly - 1 sets - 10 reps - Supine Shoulder External Rotation Stretch  - 1 x daily - 7 x weekly - 1-2 sets - 10 reps - Supine Shoulder Internal Rotation Stretch  - 1 x daily - 7 x weekly - 1-2 sets - 10 reps  HEP updated:  for shoulder strengthening and stretching Access Code: [email or web address] URL: [email or web address] Date: 11/06/2023 Prepared by: Rock Kussmaul  Exercises - Standing Bilateral Shoulder Internal Rotation AAROM with Dowel  - 1 x daily - 7 x weekly - 3 sets - 10 reps -  Standing Shoulder Extension with Dowel  - 1 x daily - 7 x weekly - 3 sets - 10 reps - Standing Overhead Shoulder External Rotation Stretch with Towel  - 1 x daily - 7 x weekly - 3 sets - 10 reps - Corner Pec Major Stretch  - 1 x daily - 7 x weekly - 1 sets - 2-3 reps - 15-20 hold - Doorway Pec Stretch at 120 Degrees Abduction  - 1 x daily - 7 x weekly - 1 sets - 2-3 reps - 15 hold  GOALS: Goals reviewed with patient? Yes  SHORT TERM GOALS: Target date: 11/08/2023  PT will review, condense and modify HEP to meet current functional level. Baseline: to be reviewed from previous POC Goal status: Goal met 11-07-23   Patient will obtain cooling vest from [employer] to improve ability to continue activity in warmer months. Baseline: PT will fax request to [employer] if pt unable to obtain on his own.  Pt has not followed up and we will do this next visit (6/16) Goal status: IN PROGRESS    [facility] rep and PT to further assess and adjust left AFO if able to improve seating into brace for improved utilization. Baseline: PT contacted Olivia at [facility] and followed up, pt wants Orthotist to come to clinic so will likely be later appt (6/16) Goal status: IN PROGRESS   LONG TERM GOALS: Target date: 12/06/2023   Patient will tolerate low to moderate level cardio focused exercise for >/=10 minutes w/ RPE of no more than 5/10 in order to demonstrate tolerance to steady state activity. Baseline: 10 minutes RPE 0/10 (7/24) Goal status: MET  Patient will verbalize strategies for energy conservation to improve compliance to safety recommendations. Baseline: Reviewed with teachback (7/24) Goal status: MET  Patient will report no right shoulder pain with light activity in order to improve functional use of UE. Baseline: 1-2/10 w/ repetition; continues to have IR discomfort with movement (7/24) Goal status: NOT MET  4.  Patient will demonstrate unsupported standing x30 seconds without LOB in order to  improve upright safety and functional participation in ADLs. Baseline:  BLE lockout w/ LOB; 6 seconds w/ flexed posture (7/24)  Goal status:  IN PROGRESS  5.  Patient will demonstrate reach to floor from standing at no more than supervision level to improve functional upright mobility and independence. Baseline:  Cannot reach to floor without BLE collapsing; minA or BUE assistance for balance (7/24)  Goal status:  NOT MET  GOALS (at 2/75 re-cert): Goals reviewed with patient? Yes  SHORT TERM GOALS = LONG TERM GOALS: Target date: 01/17/2024  Patient will report no right shoulder pain with light activity in order to improve functional use of UE. Baseline: 1-2/10 w/ repetition; continues to have IR discomfort with movement (7/24); 1/10 w/ light shoulder rolls and other typical elevation patterns (9/2) Goal status: NOT MET  2.  Patient will demonstrate unsupported standing x30 seconds without LOB in order to improve upright safety and functional participation in ADLs. Baseline:  BLE lockout w/ LOB; 6 seconds w/ flexed posture (7/24); 30 sec RLE hyperextended and trunk mildly flexed w/ L weight shift (9/2)  Goal status:  MET  3.  Patient will demonstrate reach to floor using no more than single UE support from standing at no more than supervision level to improve functional upright mobility and independence. Baseline:  Cannot reach to floor without BLE collapsing; minA or BUE assistance for balance (7/24); uses RUE on thigh for return to upright SBA (9/2)  Goal status:  MET  GOALS (at 9/2 re-cert): Goals reviewed with patient? Yes  SHORT TERM GOALS: Target date: 02/21/2024  Patient will report no falls in recent 3 weeks to demonstrate improved R knee stability. Baseline: 2 in recent week from R knee giving out (9/2) Goal status: INITIAL  LONG TERM GOALS: Target date: 03/13/2024  Patient will report scheduled follow-ups with PCP and ortho MD regarding hitting head and R knee instability  for improved safety and medical management. Baseline: Pt canceled ortho follow-up prior (9/2) Goal status: INITIAL  2.  Patient will demonstrate unsupported standing x60 seconds without LOB and improved symmetry of weight bearing in order to improve upright safety and functional participation in ADLs. Baseline:  BLE lockout w/ LOB; 6 seconds w/ flexed posture (7/24); 30 sec RLE hyperextended and trunk mildly flexed w/ L weight shift (9/2)  Goal status:  REVISED  3.  Patient will return to upright from floor reach without UE support to demonstrate improved BLE stability and righting reactions. Baseline:  Cannot reach to floor without BLE collapsing; minA or BUE assistance for balance (7/24); uses RUE on thigh for return to upright SBA (9/2)  Goal status:  REVISED  ASSESSMENT:  CLINICAL IMPRESSION: Patient has made progress towards goals as written but remains limited by R knee instability and discomfort since fall in recent weeks.  He would benefit from PCP and orthopedic follow-up regarding the knee and shoulder as well as general well-being as he hit his head during a recent fall due to knee instability.  PT to continue addressing R knee instability, shoulder pain, and static stability to improve upright mobility.  Will continue per POC.  OBJECTIVE IMPAIRMENTS: Abnormal gait, cardiopulmonary status limiting activity, decreased activity tolerance, decreased balance,  decreased cognition, decreased coordination, decreased endurance, decreased knowledge of condition, decreased knowledge of use of DME, decreased mobility, difficulty walking, decreased ROM, decreased strength, decreased safety awareness, impaired perceived functional ability, increased muscle spasms, impaired sensation, impaired tone, improper body mechanics, and pain  ACTIVITY LIMITATIONS: carrying, lifting, bending, standing, squatting, stairs, transfers, reach over head, locomotion level, and caring for others  PARTICIPATION  LIMITATIONS: cleaning, driving, shopping, community activity, occupation, and yard work  PERSONAL FACTORS: Behavior pattern, Education, Fitness, Past/current experiences, and 1-2 comorbidities: MS and significant cardiac involvement are also affecting patient's functional outcome.   REHAB POTENTIAL: Fair see personal factors and PMH  CLINICAL DECISION MAKING: Unstable/unpredictable  EVALUATION COMPLEXITY: High  PLAN:  PT FREQUENCY: 1-2x/week (2x/wk for initial 4 wks and 1x/wk for last 4 wks) + 1x/wk  PT DURATION: 8 weeks + 6 wks  PLANNED INTERVENTIONS: [id number]- PT Re-evaluation, 97750- Physical Performance Testing, 97110-Therapeutic exercises, 97530- Therapeutic activity, [id number]- Neuromuscular re-education, 97535- Self Care, [id number]- Manual therapy, [id number]- Gait training, [phone]- Orthotic Initial, [id number]- Orthotic/Prosthetic subsequent, [id number]- Aquatic Therapy, [id number]- Electrical stimulation (manual), Patient/Family education, Balance training, Stair training, Taping, Dry Needling, Joint mobilization, DME instructions, Wheelchair mobility training, Cryotherapy, and Moist heat  PLAN FOR NEXT SESSION:  cont. With cardio activities/exercises  Reaching to floor level.  Standing from low/compliant surfaces - this has recently become difficult for him.  Standing balance. Did he schedule [facility] appt?  Cardiac focus - modified BORG response and circuit style training.   He is not interested in aquatics.  R knee pain and worsened instability since fall down stairs  Stairs w/ rail and loftstrand for home as he does not have 2 rails on stairs? - he has loftstrands; reaching to lower heights (<11)  Daved KATHEE Bull, PT, DPT  01/28/2024, 2:25 PM

## 2024-01-31 ENCOUNTER — Encounter: Payer: Self-pay | Admitting: Endocrinology

## 2024-01-31 ENCOUNTER — Telehealth (HOSPITAL_COMMUNITY): Payer: Self-pay

## 2024-01-31 NOTE — CV Procedure (Signed)
  Device system confirmed to be MRI conditional, with implant date > 6 weeks ago, and no evidence of abandoned or epicardial leads in review of most recent CXR  Device last cleared by EP Provider: Suzann Riddle 01/31/24  Clearance is good through for 1 year as long as parameters remain stable at time of check. If pt undergoes a cardiac device procedure during that time, they should be re-cleared.   Tachy-therapies to be programmed off if applicable with device back to pre-MRI settings after completion of exam.  [employer] will be present for programming for the MRI.   Rocky Catalan, RT  01/31/2024 9:42 AM

## 2024-01-31 NOTE — Progress Notes (Signed)
 [facility] CLIN[facility] NOTE  Primary Care: [name], MD EP Cardiologist: Dr. Inocencio HF Cardiologist: Ria Commander, DO  HPI: Johnathan Ross is a 53 y.o. male with HFrEF, hx of VF arrest, T1DM, hypothyroidism, MS, and HLD.   Pertinent Family & social hx: N/A  Cardiac History:  - Admitted to [facility] in 3/25 VF arrest x 8 defib, TTE LVEF 30-35% - LHC 3/25 with mild non obstructive CAD.  - ICD implanted 3/25 for secondary prevention.  - Echo 7/25: EF 50-55%, normal RV   Interval hx:  Today he returns for HF follow up with his sister Overall feeling fine. Feet are swelling, he attributes this to previous ankle fractures. He walks around the house with his walker, no dyspnea with this. Denies palpitations, abnormal bleeding, CP, dizziness, edema, or PND/Orthopnea. Appetite ok. Weight at home 216 pounds. Taking all medications.  Asking to start reducing amiodarone .   Wt Readings from Last 3 Encounters:  01/24/24 95.3 kg (210 lb)  01/08/24 98 kg (216 lb)  12/16/23 96.8 kg (213 lb 6.4 oz)   BP 138/80   Pulse 63   Ht 6' 1 (1.854 m)   SpO2 99%   BMI 27.71 kg/m   ReDs reading: 36 %, abnormal  Device interrogation (personally reviewed): daily impedence reduced, no VT, <1% VP  PHYSICAL EXAM: General:  NAD. No resp difficulty, arrived in [facility] HEENT: Normal Neck: Supple. No JVD. Cor: Regular rate & rhythm. No rubs, gallops or murmurs. Lungs: Clear Abdomen: Soft, nontender, nondistended.  Extremities: No cyanosis, clubbing, rash, 2-3+ BLE pre-tibial edema Neuro: Alert & oriented x 3, moves all 4 extremities w/o difficulty. Affect pleasant.  DATA REVIEW  ECG: 12/02/23: NSR    ECHO: 08/26/23: LVEF 30-35%, RV function mildly reduced 12/03/23: LVEF 50-55%, normal RV  CATH: 08/26/23: RA:                  12 mmHg (mean) RV:                  35/10-14 mmHg PA:                  38/20 mmHg (26 mean) PCWP:            14 mmHg (mean)                                       Estimated Fick CO/CI: 4.3 L/min, 1.94 L/min/m2   Thermodilution CO/CI: 4.39 L/min, 1.98 L/min/m2                                           TPG                 12  mmHg                                            PVR                 2.8 Wood Units  PAPi                1.5     LVEDP: 18  CMR:  1. Mild to moderate  decrease in left ventricular systolic function (LVEF =40%).  2. There is no late gadolinium enhancement in the left ventricular myocardium. Normal parametric mapping and tissue characterization.  3. Mild decrease in right ventricular systolic function (RVEF =45%).  ASSESSMENT & PLAN:  Heart Failure with reduced fraction - Etiology & History: nonischemic cardiomyopathy - NYHA Class: NYHA II - Volume status: Volume up on exam & device, weight up. ReDs 36% - Change Lasix  to 20 mg on MWF - Place compression hose - GDMT:  -  RAASi: Continue losartan  50 mg daily Beta-blocker: bisoprolol  5 mg  daily -  Hydralazine /Nitrates: n/a.  -  SGLT2i: Contraindicated in the setting of T1DM -  MRA: start spiro 12.5 mg daily. - ICD: secondary prevention ICD placed 3/25 - Advanced therapies: not indicated - BMET today, repeat in 1 week  2. VT/VF arrest - 3/25: 20 minutes of bystander CPR - Remains on driving restrictions.  - seen by EP on 10/15/23, plan on stopping amio in the near future.  - Decrease amio to 100 mg daily. No events on ICD interrogation - Will notify EP patient and sister requesting to de-escalate AAD.  3. Multiple sclerosis - followed by neurology  Follow up in 4 months with Dr. Gardenia Harlene Gainer, FNP-BC 02/03/24 Advanced Heart Failure

## 2024-01-31 NOTE — Telephone Encounter (Signed)
 Called to confirm/remind patient of their appointment at the [facility] on 02/03/24.   Appointment:   [] Confirmed  [x] Left mess   [] No answer/No voice mail  [] VM Full/unable to leave message  [] Phone not in service  And to bring in all medications and/or complete list.

## 2024-02-03 ENCOUNTER — Ambulatory Visit (HOSPITAL_COMMUNITY): Payer: Self-pay | Admitting: Family Medicine

## 2024-02-03 ENCOUNTER — Ambulatory Visit (HOSPITAL_COMMUNITY)
Admission: RE | Admit: 2024-02-03 | Discharge: 2024-02-03 | Disposition: A | Discharge status: Home / Self Care | Source: Ambulatory Visit | Attending: Family Medicine | Admitting: Family Medicine

## 2024-02-03 ENCOUNTER — Encounter (HOSPITAL_COMMUNITY): Payer: Self-pay

## 2024-02-03 VITALS — BP 138/80 | HR 63 | Ht 73.0 in

## 2024-02-03 DIAGNOSIS — Z79899 Other long term (current) drug therapy: Secondary | ICD-10-CM | POA: Diagnosis not present

## 2024-02-03 DIAGNOSIS — Z8781 Personal history of (healed) traumatic fracture: Secondary | ICD-10-CM | POA: Diagnosis not present

## 2024-02-03 DIAGNOSIS — I5022 Chronic systolic (congestive) heart failure: Secondary | ICD-10-CM | POA: Diagnosis not present

## 2024-02-03 DIAGNOSIS — R269 Unspecified abnormalities of gait and mobility: Secondary | ICD-10-CM | POA: Diagnosis not present

## 2024-02-03 DIAGNOSIS — I472 Ventricular tachycardia, unspecified: Secondary | ICD-10-CM | POA: Diagnosis not present

## 2024-02-03 DIAGNOSIS — I428 Other cardiomyopathies: Secondary | ICD-10-CM | POA: Insufficient documentation

## 2024-02-03 DIAGNOSIS — E039 Hypothyroidism, unspecified: Secondary | ICD-10-CM | POA: Insufficient documentation

## 2024-02-03 DIAGNOSIS — M7989 Other specified soft tissue disorders: Secondary | ICD-10-CM | POA: Diagnosis not present

## 2024-02-03 DIAGNOSIS — E109 Type 1 diabetes mellitus without complications: Secondary | ICD-10-CM | POA: Insufficient documentation

## 2024-02-03 DIAGNOSIS — I469 Cardiac arrest, cause unspecified: Secondary | ICD-10-CM | POA: Diagnosis not present

## 2024-02-03 DIAGNOSIS — M25511 Pain in right shoulder: Secondary | ICD-10-CM | POA: Diagnosis not present

## 2024-02-03 DIAGNOSIS — Z794 Long term (current) use of insulin: Secondary | ICD-10-CM | POA: Insufficient documentation

## 2024-02-03 DIAGNOSIS — I2510 Atherosclerotic heart disease of native coronary artery without angina pectoris: Secondary | ICD-10-CM | POA: Insufficient documentation

## 2024-02-03 DIAGNOSIS — G35 Multiple sclerosis: Secondary | ICD-10-CM | POA: Diagnosis not present

## 2024-02-03 DIAGNOSIS — Z8674 Personal history of sudden cardiac arrest: Secondary | ICD-10-CM | POA: Diagnosis not present

## 2024-02-03 DIAGNOSIS — E785 Hyperlipidemia, unspecified: Secondary | ICD-10-CM | POA: Insufficient documentation

## 2024-02-03 DIAGNOSIS — M25561 Pain in right knee: Secondary | ICD-10-CM | POA: Diagnosis not present

## 2024-02-03 DIAGNOSIS — S86911A Strain of unspecified muscle(s) and tendon(s) at lower leg level, right leg, initial encounter: Secondary | ICD-10-CM | POA: Diagnosis not present

## 2024-02-03 DIAGNOSIS — M79671 Pain in right foot: Secondary | ICD-10-CM | POA: Diagnosis not present

## 2024-02-03 LAB — BASIC METABOLIC PANEL WITH GFR
Anion gap: 11 (ref 5–15)
BUN: 15 mg/dL (ref 6–20)
CO2: 26 mmol/L (ref 22–32)
Calcium: 9.5 mg/dL (ref 8.9–10.3)
Chloride: 100 mmol/L (ref 98–111)
Creatinine, Ser: 1.24 mg/dL (ref 0.61–1.24)
GFR, Estimated: >60 mL/min (ref >60)
Glucose, Bld: 230 mg/dL — ABNORMAL HIGH (ref 70–99)
Potassium: 5.2 mmol/L — ABNORMAL HIGH (ref 3.5–5.1)
Sodium: 137 mmol/L (ref 135–145)

## 2024-02-03 MED ORDER — AMIODARONE HCL 100 MG PO TABS: 100.0000 mg | ORAL_TABLET | Freq: Every day | ORAL | 3 refills | Status: DC

## 2024-02-03 MED ORDER — FUROSEMIDE 20 MG PO TABS: 20.0000 mg | ORAL_TABLET | ORAL | 3 refills | Status: DC

## 2024-02-03 MED ORDER — SPIRONOLACTONE 25 MG PO TABS: 12.5000 mg | ORAL_TABLET | Freq: Every day | ORAL | 3 refills | Status: DC

## 2024-02-03 NOTE — Patient Instructions (Addendum)
 Decrease Amiodarone  to 100 mg daily - updated Rx sent. Start Spiro 12.5 mg daily - Rx sent. Start Lasix  20 mg three times weekly on Monday/Wednesday/Friday - Rx sent. Labs today - will call you if abnormal. Repeat labs in one week - see below. Please wear your compression hose daily. Put on in am and may remove at bedtime. Return to see Dr. Gardenia in 4 months.  PLEASE CALL [phone] IN DECEMBER TO SCHEDULE THIS APPOINTMENT. Please call us  at [phone] option 2 if any questions or concerns prior to your next appointment.

## 2024-02-03 NOTE — Progress Notes (Signed)
 ReDS Vest / Clip - 02/03/24 1000       ReDS Vest / Clip   Station Marker D    Ruler Value 33    ReDS Value Range Moderate volume overload    ReDS Actual Value 36

## 2024-02-04 ENCOUNTER — Ambulatory Visit (HOSPITAL_COMMUNITY)
Admission: RE | Admit: 2024-02-04 | Discharge: 2024-02-04 | Disposition: A | Discharge status: Home / Self Care | Source: Ambulatory Visit | Attending: Neurology | Admitting: Neurology

## 2024-02-04 ENCOUNTER — Ambulatory Visit (HOSPITAL_BASED_OUTPATIENT_CLINIC_OR_DEPARTMENT_OTHER)
Admission: RE | Admit: 2024-02-04 | Discharge: 2024-02-04 | Disposition: A | Discharge status: Home / Self Care | Source: Ambulatory Visit | Attending: Neurology | Admitting: Neurology

## 2024-02-04 DIAGNOSIS — I472 Ventricular tachycardia, unspecified: Secondary | ICD-10-CM | POA: Diagnosis not present

## 2024-02-04 DIAGNOSIS — E109 Type 1 diabetes mellitus without complications: Secondary | ICD-10-CM | POA: Diagnosis not present

## 2024-02-04 DIAGNOSIS — E785 Hyperlipidemia, unspecified: Secondary | ICD-10-CM | POA: Diagnosis not present

## 2024-02-04 DIAGNOSIS — E039 Hypothyroidism, unspecified: Secondary | ICD-10-CM | POA: Diagnosis not present

## 2024-02-04 DIAGNOSIS — G35 Multiple sclerosis: Secondary | ICD-10-CM | POA: Diagnosis not present

## 2024-02-04 DIAGNOSIS — I5022 Chronic systolic (congestive) heart failure: Secondary | ICD-10-CM | POA: Diagnosis not present

## 2024-02-04 DIAGNOSIS — I2510 Atherosclerotic heart disease of native coronary artery without angina pectoris: Secondary | ICD-10-CM | POA: Diagnosis not present

## 2024-02-04 DIAGNOSIS — R269 Unspecified abnormalities of gait and mobility: Secondary | ICD-10-CM

## 2024-02-04 DIAGNOSIS — I428 Other cardiomyopathies: Secondary | ICD-10-CM | POA: Diagnosis not present

## 2024-02-04 DIAGNOSIS — M7989 Other specified soft tissue disorders: Secondary | ICD-10-CM | POA: Diagnosis not present

## 2024-02-04 MED ORDER — GADOBUTROL 1 MMOL/ML IV SOLN
9.0000 mL | Freq: Once | INTRAVENOUS | Status: AC | PRN
  Administered 2024-02-04: 9 mL via INTRAVENOUS

## 2024-02-04 NOTE — Progress Notes (Signed)
 Patient was monitored by this RN during MRI scan due to presence of a pacemaker. Cardiac rhythm was continuously monitored throughout the procedure. Prior to the start of the scan, the pacemaker was placed in MRI-safe mode by the MRI technician and/or pacemaker representative. Following the completion of the scan, the device was returned to its pre-MRI settings. Neurological status and orientation post-procedure were unchanged from baseline.   Pre-procedure Heart Rate (Prior to being placed in MRI safe mode): intrinsic HR 60, therapy turned off for MRI  Post-procedure Heart Rate (Once pacemaker is returned to baseline mode): intrinsic HR 63

## 2024-02-05 DIAGNOSIS — E1065 Type 1 diabetes mellitus with hyperglycemia: Secondary | ICD-10-CM | POA: Diagnosis not present

## 2024-02-07 ENCOUNTER — Ambulatory Visit: Payer: Self-pay | Admitting: Neurology

## 2024-02-11 ENCOUNTER — Encounter: Payer: Self-pay | Admitting: Physical Therapy

## 2024-02-11 ENCOUNTER — Ambulatory Visit: Admitting: Physical Therapy

## 2024-02-11 ENCOUNTER — Ambulatory Visit (HOSPITAL_COMMUNITY)
Admission: RE | Admit: 2024-02-11 | Discharge: 2024-02-11 | Disposition: A | Discharge status: Home / Self Care | Source: Ambulatory Visit | Attending: Internal Medicine | Admitting: Internal Medicine

## 2024-02-11 DIAGNOSIS — R29898 Other symptoms and signs involving the musculoskeletal system: Secondary | ICD-10-CM

## 2024-02-11 DIAGNOSIS — M25511 Pain in right shoulder: Secondary | ICD-10-CM | POA: Diagnosis not present

## 2024-02-11 DIAGNOSIS — I5022 Chronic systolic (congestive) heart failure: Secondary | ICD-10-CM

## 2024-02-11 DIAGNOSIS — R262 Difficulty in walking, not elsewhere classified: Secondary | ICD-10-CM

## 2024-02-11 DIAGNOSIS — R2689 Other abnormalities of gait and mobility: Secondary | ICD-10-CM | POA: Diagnosis not present

## 2024-02-11 DIAGNOSIS — R2681 Unsteadiness on feet: Secondary | ICD-10-CM

## 2024-02-11 DIAGNOSIS — M21372 Foot drop, left foot: Secondary | ICD-10-CM

## 2024-02-11 DIAGNOSIS — R29818 Other symptoms and signs involving the nervous system: Secondary | ICD-10-CM

## 2024-02-11 DIAGNOSIS — M6281 Muscle weakness (generalized): Secondary | ICD-10-CM

## 2024-02-11 DIAGNOSIS — G8929 Other chronic pain: Secondary | ICD-10-CM

## 2024-02-11 DIAGNOSIS — M21371 Foot drop, right foot: Secondary | ICD-10-CM | POA: Diagnosis not present

## 2024-02-11 NOTE — Therapy (Signed)
 OUTPATIENT PHYSICAL THERAPY NEURO TREATMENT   Patient Name: Johnathan Ross MRN: [id number] DOB:[birth year], 53 y.o., male Today's Date: 02/11/2024   PCP: [name], MD  REFERRING PROVIDER: Pegge Toribio PARAS, PA-C  END OF SESSION:  PT End of Session - 02/11/24 0945     Visit Number 16    Number of Visits 22   16+6   Date for PT Re-Evaluation 03/27/24   pushed out due to scheduling delay at re-cert   Authorization Type [employer]    Authorization Time Period 10-08-23 - 12-20-23; 4 more visits 12/19/23 - 01/31/24; Approved 6 PT visits 01/28/2024 - 03/27/2024    Authorization - Visit Number 16    Authorization - Number of Visits 22    Progress Note Due on Visit 20    PT Start Time 0935    PT Stop Time 1016    PT Time Calculation (min) 41 min    Equipment Utilized During Treatment Gait belt    Activity Tolerance Patient tolerated treatment well    Behavior During Therapy WFL for tasks assessed/performed              Past Medical History:  Diagnosis Date   Hyperlipidemia    Hypertension    Hypogonadism in male 11/01/2016   Hypothyroidism    Multiple sclerosis (HCC)    Neuromuscular disorder (HCC)    Proliferative diabetic retinopathy(362.02)    Type 1 diabetes mellitus (HCC) dx'd 1994   Vitamin D  insufficiency 11/29/2016   Past Surgical History:  Procedure Laterality Date   EYE SURGERY Bilateral    laser for diabetic retinopathy   FRACTURE SURGERY     ICD IMPLANT N/A 08/30/2023   Procedure: ICD IMPLANT;  Surgeon: Inocencio Soyla Lunger, MD;  Location: [facility];  Service: Cardiovascular;  Laterality: N/A;   IR VENO/EXT/UNI LEFT  03/24/2020   OPEN REDUCTION INTERNAL FIXATION (ORIF) TIBIA/FIBULA FRACTURE Right 10/30/2016   ORIF ANKLE FRACTURE Left 2015   ORIF TIBIA FRACTURE Right 10/30/2016   Procedure: OPEN REDUCTION INTERNAL FIXATION (ORIF) TIBIA FIBULA FRACTURE;  Surgeon: Celena Sharper, MD;  Location: [facility];  Service: Orthopedics;  Laterality:  Right;   RETINAL LASER PROCEDURE Bilateral    for diabetic retinopathy   RIGHT/LEFT HEART CATH AND CORONARY ANGIOGRAPHY N/A 08/26/2023   Procedure: RIGHT/LEFT HEART CATH AND CORONARY ANGIOGRAPHY;  Surgeon: Gardenia Led, DO;  Location: [facility];  Service: Cardiovascular;  Laterality: N/A;   Patient Active Problem List   Diagnosis Date Noted   Mass of right lower leg 11/13/2023   Attention and concentration deficit 09/10/2023   Need for immunization against influenza 06/17/2023   Loud snoring 06/17/2023   Encounter for general adult medical examination with abnormal findings 08/02/2022   Screening for prostate cancer 08/01/2021   OAB (overactive bladder) 09/21/2020   Essential hypertension 09/21/2020   Thoracic outlet syndrome 05/03/2020   Controlled type 1 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 (HCC) 03/24/2020   Right epiretinal membrane 03/24/2020   Retinal exudates and deposits 03/24/2020   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   Nuclear sclerotic cataract of both eyes 03/24/2020   Gait disorder 03/23/2020   Benign prostatic hyperplasia without lower urinary tract symptoms 03/01/2020   Diabetic gastroparesis associated with type 1 diabetes mellitus (HCC) 03/01/2020   Gait disturbance 08/19/2019   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/19/2019   Vitamin D  insufficiency 11/29/2016   Hypogonadism in male 11/01/2016   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HCC)  Hypothyroidism    Hypertension    Dyslipidemia, goal LDL below 130    MULTIPLE SCLEROSIS 08/29/2007   PROLIFERATIVE DIABETIC RETINOPATHY 08/29/2007    ONSET DATE: 08/26/2023  REFERRING DIAG: G93.40 (ICD-10-CM) - Acute encephalopathy  THERAPY DIAG:  Other abnormalities of gait and mobility  Muscle weakness (generalized)  Other symptoms and signs involving the nervous system  Unsteadiness on feet  Other symptoms and signs involving the musculoskeletal  system  Difficulty in walking, not elsewhere classified  Foot drop, right  Foot drop, left  Chronic right shoulder pain  Rationale for Evaluation and Treatment: Rehabilitation  SUBJECTIVE:                                                                                                                                                                                             SUBJECTIVE STATEMENT: Pt ambulates into gym today with 2WW using intermittent reciprocal pattern.  He states he switched to his new 2WW and it feels awkward and he is unsure why as the height is the same.  He denies recent falls or status changes.  He heard back about his MRI which he states was unchanged, but MD is considering medication addition should anything be approved for SPMS.  Pt states he is unsure what RRMS vs SPMS is.  He saw [facility] about his knee and they took x-rays that did not show anything abnormal.  He states they gave him a brace that he sent back as he ordered his own off [employer].  Pt accompanied by: self  PERTINENT HISTORY: vitamin D  insufficiency, Type 1 IDDM w/ polyneuropathy, diabetic retinopathy, hypothyroidism, HTN, hypercholestrolemia, h/o Rt tibia fracture, h/o Lt ankle fracture with ORIF, MS with acquired diplegia (MS diagnosis 2014/2015)  In the week prior to his cardiac arrest he reported having some near syncopal episodes.  On 3/31 he went into witnessed cardiac arrest w/ bystander performing CPR for 20-25 minutes.  Per EMS notes he had persistent V-fib requiring 8 total defibrillations, 2 doses of epi and some ROSC w/ V tach.  He was also given Narcan in the field due to small pupils.  PAIN:  Are you having pain? Yes: NPRS scale: 2 Pain location: back of right knee Pain description: achy Aggravating factors: standing on RLE Relieving factors: getting off of RLE  PRECAUTIONS: Fall and ICD/Pacemaker (Can raise R shoulder as of May 9th - 6wks post placement of ICD; monitor BP - if  systolic drops below 100 he is to follow-up with Cardiologist about medication management)  RED FLAGS: None   WEIGHT BEARING RESTRICTIONS: No  FALLS: Has patient fallen in last  6 months? No  LIVING ENVIRONMENT: Lives with: lives with their partner Lives in: House/apartment Stairs: Yes: External: 5 steps; on right going up, on left going up, and can reach both Has following equipment at home: Single point cane, Walker - 2 wheeled, Wheelchair (manual), and shower chair  PLOF: Requires assistive device for independence and Needs assistance with homemaking  PATIENT GOALS: fix my brace and if there's anything we can do for my shoulder   OBJECTIVE:  Note: Objective measures were completed at Evaluation unless otherwise noted.  DIAGNOSTIC FINDINGS:  MRI Cardiac 08/29/2023: IMPRESSION: 1. Mild to moderate decrease in left ventricular systolic function (LVEF =40%).   2. There is no late gadolinium enhancement in the left ventricular myocardium. Normal parametric mapping and tissue characterization.   3. Mild decrease in right ventricular systolic function (RVEF =45%).  CT Head 08/26/2023: IMPRESSION: 1. No evidence of acute intracranial abnormality. 2. White matter lesions better characterized on prior MRI.  CT Angio Chest PE 08/26/2023: IMPRESSION: 1. No pulmonary embolism. 2. Moderate left anterior descending coronary artery calcification. 3. Extensive dependent pulmonary consolidation throughout the lungs which may relate to aspiration or multifocal infection.  *Updated imaging ordered by Dr. Vear (MRI of brain and C-spine)   COGNITION: Overall cognitive status: Within functional limits for tasks assessed   SENSATION: Light touch: WFL   MUSCLE TONE: Increased extensor tone of LLE >RLE but ROM and coordination limited bilaterally due to extensor tone/spasticity    POSTURE: rounded shoulders, forward head, and weight shift right  LOWER EXTREMITY ROM:   Limited by  extensor tone/spasticity   Active  Right Eval Left Eval  Hip flexion    Hip extension    Hip abduction    Hip adduction    Hip internal rotation    Hip external rotation    Knee flexion    Knee extension    Ankle dorsiflexion    Ankle plantarflexion    Ankle inversion    Ankle eversion     (Blank rows = not tested)  LOWER EXTREMITY MMT:  Tested in seated position   MMT Right Eval Left Eval  Hip flexion 1 1  Hip extension    Hip abduction 4- 4-  Hip adduction    Hip internal rotation    Hip external rotation    Knee flexion 2 3-  Knee extension 3+ 1  Ankle dorsiflexion AFO donned AFO donned  Ankle plantarflexion    Ankle inversion    Ankle eversion    (Blank rows = not tested)  BED MOBILITY:  Independent per pt - still in a standard bed, needs increased time to roll L and R  TRANSFERS: Assistive device utilized: Environmental consultant - 2 wheeled  Sit to stand: SBA Stand to sit: SBA Heavy reliance on BUEs to essentially throw himself out of chair, wide BOS, better hip extension today when standing to 2WW, when he lets go he maintains anterior lean in attempt to prevent knees locking out, when knees lock out he loses his balance.   GAIT: Gait pattern: step to pattern, decreased step length- Left, decreased stride length, decreased hip/knee flexion- Right, decreased hip/knee flexion- Left, decreased ankle dorsiflexion- Right, decreased ankle dorsiflexion- Left, lateral hip instability, trunk flexed, narrow BOS, and poor foot clearance- Left Distance walked: Various clinic distances  Assistive device utilized: Walker - 2 wheeled Level of assistance: SBA Comments: Pt required 9 minutes to ambulate into treatment room (~30') , frequently stopping to rest as his legs lock up. Noted  excessive inversion of L ankle as well as scissoring of LLE. Pt relying heavily on BUEs to perform more of a swing-to pattern.   FUNCTIONAL TESTS:                                                                                                                                  TREATMENT: 02/11/24  Self-Care: -Provided basic definitions of RRMS vs SPMS and answered pt questions related to rehab -Discussed following up with MD if knee pain persists for MRI vs x-ray based on severity of MOI and persistence of pain and new instability. -Offered to adjust walker for pt to try slightly lower height w/ him politely declining, but did discuss maybe trying this at home to assess if he feels less posterior push and shoulder hike  TE/TA: -Arm bike x4 minutes forward > x4 minute backwards on level 5.0 for postural awareness and shoulder and periscapular stability. -Patellar glides for pain management and edema - verbal edu on home use provided for him > taped patella and hamstrings for stability and to limit hyperextension  PATIENT EDUCATION: Education details: Please call [facility] and schedule appt. Continue HEP.   Recommending 2-hand support for stairs - possibly laterally, reaching to floor, and walking - do not furniture walk!  Taping - how to remove tape if irritating skin and expected length of time and goal of proprioceptive/kinesthetic input.  Patellar glides for home. Person educated: Patient Education method: Explanation, Demonstration, Tactile cues, Verbal cues, and Handouts Education comprehension: verbalized understanding, returned demonstration, verbal cues required, tactile cues required, and needs further education  HOME EXERCISE PROGRAM:  Access Code: [email or web address] URL: [email or web address] Date: 10/28/2023 Prepared by: Daved Bull  Exercises - Supine Thoracic Mobilization Towel Roll Vertical with Arm Stretch  - 1 x daily - 5 x weekly - 1 sets - 2 minutes hold - Supine Lower Trunk Rotation  - 1 x daily - 5 x weekly - 2 sets - 10 reps - Seated Gastroc Stretch with Strap  - 1 x daily - 7 x weekly - 2 sets - 60 second hold - Standing Gastroc Stretch at Counter  - 2 x  daily - 7 x weekly - 1 sets - 3 reps - 30 sec hold - Heel Sits  - 1 x daily - 5 x weekly - 1 sets - 12 reps - Tall Kneeling Diagonal Lift with Medicine Ball  - 1 x daily - 5 x weekly - 1 sets - 5-10 reps - Marching Bridge  - 1 x daily - 5 x weekly - 1 sets - 6 reps - Clamshell  - 1 x daily - 5 x weekly - 1-2 sets - 10 reps - Long Sitting Hamstring Stretch  - 1 x daily - 5 x weekly - 1 sets - 2-3 reps - 30-45 seconds hold - Seated Knee Flexion  - 1 x daily - 5 x weekly - 1-2 sets - 4 reps -  Seated Hip Abduction with Resistance  - 1 x daily - 5 x weekly - 2 sets - 10 reps - Seated Scapular Retraction  - 1 x daily - 7 x weekly - 3 sets - 10 reps - Single Arm Doorway Pec Stretch at 90 Degrees Abduction  - 1 x daily - 7 x weekly - 1 sets - 3-5 reps - 30 sec each hold - Open Book Chest Stretch on Towel Roll  - 1 x daily - 7 x weekly - 1 sets - 10 reps - Supine Shoulder External Rotation Stretch  - 1 x daily - 7 x weekly - 1-2 sets - 10 reps - Supine Shoulder Internal Rotation Stretch  - 1 x daily - 7 x weekly - 1-2 sets - 10 reps  HEP updated:  for shoulder strengthening and stretching Access Code: [email or web address] URL: [email or web address] Date: 11/06/2023 Prepared by: Rock Kussmaul  Exercises - Standing Bilateral Shoulder Internal Rotation AAROM with Dowel  - 1 x daily - 7 x weekly - 3 sets - 10 reps - Standing Shoulder Extension with Dowel  - 1 x daily - 7 x weekly - 3 sets - 10 reps - Standing Overhead Shoulder External Rotation Stretch with Towel  - 1 x daily - 7 x weekly - 3 sets - 10 reps - Corner Pec Major Stretch  - 1 x daily - 7 x weekly - 1 sets - 2-3 reps - 15-20 hold - Doorway Pec Stretch at 120 Degrees Abduction  - 1 x daily - 7 x weekly - 1 sets - 2-3 reps - 15 hold  GOALS: Goals reviewed with patient? Yes  SHORT TERM GOALS: Target date: 11/08/2023  PT will review, condense and modify HEP to meet current functional level. Baseline: to be reviewed from previous POC Goal  status: Goal met 11-07-23   Patient will obtain cooling vest from [employer] to improve ability to continue activity in warmer months. Baseline: PT will fax request to [employer] if pt unable to obtain on his own.  Pt has not followed up and we will do this next visit (6/16) Goal status: IN PROGRESS    [facility] rep and PT to further assess and adjust left AFO if able to improve seating into brace for improved utilization. Baseline: PT contacted Olivia at [facility] and followed up, pt wants Orthotist to come to clinic so will likely be later appt (6/16) Goal status: IN PROGRESS   LONG TERM GOALS: Target date: 12/06/2023   Patient will tolerate low to moderate level cardio focused exercise for >/=10 minutes w/ RPE of no more than 5/10 in order to demonstrate tolerance to steady state activity. Baseline: 10 minutes RPE 0/10 (7/24) Goal status: MET  Patient will verbalize strategies for energy conservation to improve compliance to safety recommendations. Baseline: Reviewed with teachback (7/24) Goal status: MET  Patient will report no right shoulder pain with light activity in order to improve functional use of UE. Baseline: 1-2/10 w/ repetition; continues to have IR discomfort with movement (7/24) Goal status: NOT MET  4.  Patient will demonstrate unsupported standing x30 seconds without LOB in order to improve upright safety and functional participation in ADLs. Baseline:  BLE lockout w/ LOB; 6 seconds w/ flexed posture (7/24)  Goal status:  IN PROGRESS  5.  Patient will demonstrate reach to floor from standing at no more than supervision level to improve functional upright mobility and independence. Baseline:  Cannot reach to floor without BLE collapsing; minA or BUE  assistance for balance (7/24)  Goal status:  NOT MET  GOALS (at 2/75 re-cert): Goals reviewed with patient? Yes  SHORT TERM GOALS = LONG TERM GOALS: Target date: 01/17/2024  Patient will report no right shoulder pain with  light activity in order to improve functional use of UE. Baseline: 1-2/10 w/ repetition; continues to have IR discomfort with movement (7/24); 1/10 w/ light shoulder rolls and other typical elevation patterns (9/2) Goal status: NOT MET  2.  Patient will demonstrate unsupported standing x30 seconds without LOB in order to improve upright safety and functional participation in ADLs. Baseline:  BLE lockout w/ LOB; 6 seconds w/ flexed posture (7/24); 30 sec RLE hyperextended and trunk mildly flexed w/ L weight shift (9/2)  Goal status:  MET  3.  Patient will demonstrate reach to floor using no more than single UE support from standing at no more than supervision level to improve functional upright mobility and independence. Baseline:  Cannot reach to floor without BLE collapsing; minA or BUE assistance for balance (7/24); uses RUE on thigh for return to upright SBA (9/2)  Goal status:  MET  GOALS (at 9/2 re-cert): Goals reviewed with patient? Yes  SHORT TERM GOALS: Target date: 02/21/2024  Patient will report no falls in recent 3 weeks to demonstrate improved R knee stability. Baseline: 2 in recent week from R knee giving out (9/2) Goal status: INITIAL  LONG TERM GOALS: Target date: 03/13/2024  Patient will report scheduled follow-ups with PCP and ortho MD regarding hitting head and R knee instability for improved safety and medical management. Baseline: Pt canceled ortho follow-up prior (9/2) Goal status: INITIAL  2.  Patient will demonstrate unsupported standing x60 seconds without LOB and improved symmetry of weight bearing in order to improve upright safety and functional participation in ADLs. Baseline:  BLE lockout w/ LOB; 6 seconds w/ flexed posture (7/24); 30 sec RLE hyperextended and trunk mildly flexed w/ L weight shift (9/2)  Goal status:  REVISED  3.  Patient will return to upright from floor reach without UE support to demonstrate improved BLE stability and righting  reactions. Baseline:  Cannot reach to floor without BLE collapsing; minA or BUE assistance for balance (7/24); uses RUE on thigh for return to upright SBA (9/2)  Goal status:  REVISED  ASSESSMENT:  CLINICAL IMPRESSION: Emphasis of skilled session on addressing ongoing R knee pain.  He continues to be limited by hyperextension and general extension pain of knee so taped knee for sensory input to help relieve symptoms during acute/subacute healing period. Will progress to addressing knee stability and return to baseline mobility tolerance at next session.  Will continue per POC.  OBJECTIVE IMPAIRMENTS: Abnormal gait, cardiopulmonary status limiting activity, decreased activity tolerance, decreased balance, decreased cognition, decreased coordination, decreased endurance, decreased knowledge of condition, decreased knowledge of use of DME, decreased mobility, difficulty walking, decreased ROM, decreased strength, decreased safety awareness, impaired perceived functional ability, increased muscle spasms, impaired sensation, impaired tone, improper body mechanics, and pain  ACTIVITY LIMITATIONS: carrying, lifting, bending, standing, squatting, stairs, transfers, reach over head, locomotion level, and caring for others  PARTICIPATION LIMITATIONS: cleaning, driving, shopping, community activity, occupation, and yard work  PERSONAL FACTORS: Behavior pattern, Education, Fitness, Past/current experiences, and 1-2 comorbidities: MS and significant cardiac involvement are also affecting patient's functional outcome.   REHAB POTENTIAL: Fair see personal factors and PMH  CLINICAL DECISION MAKING: Unstable/unpredictable  EVALUATION COMPLEXITY: High  PLAN:  PT FREQUENCY: 1-2x/week (2x/wk for initial 4 wks and 1x/wk  for last 4 wks) + 1x/wk  PT DURATION: 8 weeks + 6 wks  PLANNED INTERVENTIONS: [id number]- PT Re-evaluation, 97750- Physical Performance Testing, 97110-Therapeutic exercises, 97530- Therapeutic  activity, [id number]- Neuromuscular re-education, 97535- Self Care, [id number]- Manual therapy, [id number]- Gait training, [phone]- Orthotic Initial, [phone]- Orthotic/Prosthetic subsequent, [phone]- Aquatic Therapy, [phone]- Electrical stimulation (manual), Patient/Family education, Balance training, Stair training, Taping, Dry Needling, Joint mobilization, DME instructions, Wheelchair mobility training, Cryotherapy, and Moist heat  PLAN FOR NEXT SESSION:  cont. With cardio activities/exercises  Reaching to floor level.  Standing from low/compliant surfaces - this has recently become difficult for him.  Standing balance. Did he schedule [facility] appt?  Cardiac focus - modified BORG response and circuit style training.   He is not interested in aquatics.  R knee pain and worsened instability since fall down stairs  Stairs w/ rail and loftstrand for home as he does not have 2 rails on stairs? - he has loftstrands; reaching to lower heights (<11)  Daved KATHEE Bull, PT, DPT  02/11/2024, 10:23 AM

## 2024-02-17 ENCOUNTER — Encounter: Payer: Self-pay | Admitting: Family Medicine

## 2024-02-18 ENCOUNTER — Encounter: Payer: Self-pay | Admitting: Physical Therapy

## 2024-02-18 ENCOUNTER — Ambulatory Visit: Admitting: Physical Therapy

## 2024-02-18 DIAGNOSIS — R29818 Other symptoms and signs involving the nervous system: Secondary | ICD-10-CM | POA: Diagnosis not present

## 2024-02-18 DIAGNOSIS — M21371 Foot drop, right foot: Secondary | ICD-10-CM | POA: Diagnosis not present

## 2024-02-18 DIAGNOSIS — M21372 Foot drop, left foot: Secondary | ICD-10-CM | POA: Diagnosis not present

## 2024-02-18 DIAGNOSIS — R2689 Other abnormalities of gait and mobility: Secondary | ICD-10-CM

## 2024-02-18 DIAGNOSIS — R2681 Unsteadiness on feet: Secondary | ICD-10-CM | POA: Diagnosis not present

## 2024-02-18 DIAGNOSIS — G8929 Other chronic pain: Secondary | ICD-10-CM

## 2024-02-18 DIAGNOSIS — R29898 Other symptoms and signs involving the musculoskeletal system: Secondary | ICD-10-CM

## 2024-02-18 DIAGNOSIS — R262 Difficulty in walking, not elsewhere classified: Secondary | ICD-10-CM

## 2024-02-18 DIAGNOSIS — M6281 Muscle weakness (generalized): Secondary | ICD-10-CM | POA: Diagnosis not present

## 2024-02-18 DIAGNOSIS — M25511 Pain in right shoulder: Secondary | ICD-10-CM | POA: Diagnosis not present

## 2024-02-18 NOTE — Therapy (Signed)
 OUTPATIENT PHYSICAL THERAPY NEURO TREATMENT   Patient Name: Johnathan Ross MRN: [id number] DOB:1971/04/02, 53 y.o., male Today's Date: 02/18/2024   PCP: [name], MD  REFERRING PROVIDER: Pegge Toribio PARAS, PA-C  END OF SESSION:  PT End of Session - 02/18/24 0949     Visit Number 17    Number of Visits 22   16+6   Date for Recertification  03/27/24   pushed out due to scheduling delay at re-cert   Authorization Type [employer]    Authorization Time Period 10-08-23 - 12-20-23; 4 more visits 12/19/23 - 01/31/24; Approved 6 PT visits 01/28/2024 - 03/27/2024    Authorization - Visit Number 17    Authorization - Number of Visits 22    Progress Note Due on Visit 20    PT Start Time 0936    PT Stop Time 1021    PT Time Calculation (min) 45 min    Equipment Utilized During Treatment Gait belt    Activity Tolerance Patient tolerated treatment well    Behavior During Therapy [facility] for tasks assessed/performed              Past Medical History:  Diagnosis Date   Hyperlipidemia    Hypertension    Hypogonadism in male 11/01/2016   Hypothyroidism    Multiple sclerosis    Neuromuscular disorder (HCC)    Proliferative diabetic retinopathy(362.02)    Type 1 diabetes mellitus (HCC) dx'd 1994   Vitamin D  insufficiency 11/29/2016   Past Surgical History:  Procedure Laterality Date   EYE SURGERY Bilateral    laser for diabetic retinopathy   FRACTURE SURGERY     ICD IMPLANT N/A 08/30/2023   Procedure: ICD IMPLANT;  Surgeon: Inocencio Soyla Lunger, MD;  Location: [facility];  Service: Cardiovascular;  Laterality: N/A;   IR VENO/EXT/UNI LEFT  03/24/2020   OPEN REDUCTION INTERNAL FIXATION (ORIF) TIBIA/FIBULA FRACTURE Right 10/30/2016   ORIF ANKLE FRACTURE Left 2015   ORIF TIBIA FRACTURE Right 10/30/2016   Procedure: OPEN REDUCTION INTERNAL FIXATION (ORIF) TIBIA FIBULA FRACTURE;  Surgeon: Celena Sharper, MD;  Location: [facility] OR;  Service: Orthopedics;  Laterality: Right;    RETINAL LASER PROCEDURE Bilateral    for diabetic retinopathy   RIGHT/LEFT HEART CATH AND CORONARY ANGIOGRAPHY N/A 08/26/2023   Procedure: RIGHT/LEFT HEART CATH AND CORONARY ANGIOGRAPHY;  Surgeon: Gardenia Led, DO;  Location: [facility];  Service: Cardiovascular;  Laterality: N/A;   Patient Active Problem List   Diagnosis Date Noted   Mass of right lower leg 11/13/2023   Attention and concentration deficit 09/10/2023   Need for immunization against influenza 06/17/2023   Loud snoring 06/17/2023   Encounter for general adult medical examination with abnormal findings 08/02/2022   Screening for prostate cancer 08/01/2021   OAB (overactive bladder) 09/21/2020   Essential hypertension 09/21/2020   Thoracic outlet syndrome 05/03/2020   Controlled type 1 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 (HCC) 03/24/2020   Right epiretinal membrane 03/24/2020   Retinal exudates and deposits 03/24/2020   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   Nuclear sclerotic cataract of both eyes 03/24/2020   Gait disorder 03/23/2020   Benign prostatic hyperplasia without lower urinary tract symptoms 03/01/2020   Diabetic gastroparesis associated with type 1 diabetes mellitus (HCC) 03/01/2020   Gait disturbance 08/19/2019   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/19/2019   Vitamin D  insufficiency 11/29/2016   Hypogonadism in male 11/01/2016   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HCC)  Hypothyroidism    Hypertension    Dyslipidemia, goal LDL below 130    MULTIPLE SCLEROSIS 08/29/2007   PROLIFERATIVE DIABETIC RETINOPATHY 08/29/2007    ONSET DATE: 08/26/2023  REFERRING DIAG: G93.40 (ICD-10-CM) - Acute encephalopathy  THERAPY DIAG:  Unsteadiness on feet  Other abnormalities of gait and mobility  Muscle weakness (generalized)  Other symptoms and signs involving the nervous system  Other symptoms and signs involving the musculoskeletal  system  Difficulty in walking, not elsewhere classified  Foot drop, right  Foot drop, left  Chronic right shoulder pain  Rationale for Evaluation and Treatment: Rehabilitation  SUBJECTIVE:                                                                                                                                                                                             SUBJECTIVE STATEMENT: Pt ambulates into gym today with 2WW using intermittent reciprocal pattern.  He recently increased height of 2WW back to what it was before stating it has helped him walk better.  He is having no shoulder irritation today, but knee continues to have low level pain though he feels it is slowly improving.  He didn't notice much change with taping of knee last session.  No falls or acute changes.  Pt accompanied by: self  PERTINENT HISTORY: vitamin D  insufficiency, Type 1 IDDM w/ polyneuropathy, diabetic retinopathy, hypothyroidism, HTN, hypercholestrolemia, h/o Rt tibia fracture, h/o Lt ankle fracture with ORIF, MS with acquired diplegia (MS diagnosis 2014/2015)  In the week prior to his cardiac arrest he reported having some near syncopal episodes.  On 3/31 he went into witnessed cardiac arrest w/ bystander performing CPR for 20-25 minutes.  Per EMS notes he had persistent V-fib requiring 8 total defibrillations, 2 doses of epi and some ROSC w/ V tach.  He was also given Narcan in the field due to small pupils.  PAIN:  Are you having pain? Yes: NPRS scale: 1 Pain location: back of right knee Pain description: achy Aggravating factors: standing on RLE Relieving factors: getting off of RLE  PRECAUTIONS: Fall and ICD/Pacemaker (Can raise R shoulder as of May 9th - 6wks post placement of ICD; monitor BP - if systolic drops below 100 he is to follow-up with Cardiologist about medication management)  RED FLAGS: None   WEIGHT BEARING RESTRICTIONS: No  FALLS: Has patient fallen in last 6 months?  No  LIVING ENVIRONMENT: Lives with: lives with their partner Lives in: House/apartment Stairs: Yes: External: 5 steps; on right going up, on left going up, and can reach both Has following equipment at home: Single point cane, Walker - 2  wheeled, Wheelchair (manual), and shower chair  PLOF: Requires assistive device for independence and Needs assistance with homemaking  PATIENT GOALS: fix my brace and if there's anything we can do for my shoulder   OBJECTIVE:  Note: Objective measures were completed at Evaluation unless otherwise noted.  DIAGNOSTIC FINDINGS:  MRI Cardiac 08/29/2023: IMPRESSION: 1. Mild to moderate decrease in left ventricular systolic function (LVEF =40%).   2. There is no late gadolinium enhancement in the left ventricular myocardium. Normal parametric mapping and tissue characterization.   3. Mild decrease in right ventricular systolic function (RVEF =45%).  CT Head 08/26/2023: IMPRESSION: 1. No evidence of acute intracranial abnormality. 2. White matter lesions better characterized on prior MRI.  CT Angio Chest PE 08/26/2023: IMPRESSION: 1. No pulmonary embolism. 2. Moderate left anterior descending coronary artery calcification. 3. Extensive dependent pulmonary consolidation throughout the lungs which may relate to aspiration or multifocal infection.  *Updated imaging ordered by Dr. Vear (MRI of brain and C-spine)   COGNITION: Overall cognitive status: Within functional limits for tasks assessed   SENSATION: Light touch: WFL   MUSCLE TONE: Increased extensor tone of LLE >RLE but ROM and coordination limited bilaterally due to extensor tone/spasticity    POSTURE: rounded shoulders, forward head, and weight shift right  LOWER EXTREMITY ROM:   Limited by extensor tone/spasticity   Active  Right Eval Left Eval  Hip flexion    Hip extension    Hip abduction    Hip adduction    Hip internal rotation    Hip external rotation    Knee flexion     Knee extension    Ankle dorsiflexion    Ankle plantarflexion    Ankle inversion    Ankle eversion     (Blank rows = not tested)  LOWER EXTREMITY MMT:  Tested in seated position   MMT Right Eval Left Eval  Hip flexion 1 1  Hip extension    Hip abduction 4- 4-  Hip adduction    Hip internal rotation    Hip external rotation    Knee flexion 2 3-  Knee extension 3+ 1  Ankle dorsiflexion AFO donned AFO donned  Ankle plantarflexion    Ankle inversion    Ankle eversion    (Blank rows = not tested)  BED MOBILITY:  Independent per pt - still in a standard bed, needs increased time to roll L and R  TRANSFERS: Assistive device utilized: Environmental consultant - 2 wheeled  Sit to stand: SBA Stand to sit: SBA Heavy reliance on BUEs to essentially throw himself out of chair, wide BOS, better hip extension today when standing to 2WW, when he lets go he maintains anterior lean in attempt to prevent knees locking out, when knees lock out he loses his balance.   GAIT: Gait pattern: step to pattern, decreased step length- Left, decreased stride length, decreased hip/knee flexion- Right, decreased hip/knee flexion- Left, decreased ankle dorsiflexion- Right, decreased ankle dorsiflexion- Left, lateral hip instability, trunk flexed, narrow BOS, and poor foot clearance- Left Distance walked: Various clinic distances  Assistive device utilized: Walker - 2 wheeled Level of assistance: SBA Comments: Pt required 9 minutes to ambulate into treatment room (~30') , frequently stopping to rest as his legs lock up. Noted excessive inversion of L ankle as well as scissoring of LLE. Pt relying heavily on BUEs to perform more of a swing-to pattern.   FUNCTIONAL TESTS:  TREATMENT: 02/18/24  TE/TA: -Arm bike x4 minutes forward > x4 minute backwards on level 5.0 for postural awareness and  shoulder and periscapular stability.  Cued to decrease back support for increased core challenge throughout task. -PROM to BLE w/ PNF extension pattern - gentle oscillations for tone management and to increase flexion pattern on return/ bilateral passive prolonged SKTC per pt request -Seated EOM 10lb slam balls x20 > added unlevel firm and compliant dynadisc for 10lb slam ball x20  NMR: -Standing on airex:  several attempts to unlock bilateral knees w/ assistance and maintain unsupported standing, when attempting upright trunk pt has posterior LOB often needing to sit rapidly, PT assists in positioning ankle between reps -Standing on firm:  repeated prior task w/ less LOB and improved ankle positioning, but pt requires crotched stance to remain w/ knees unlocked and posture unsupported  PATIENT EDUCATION: Education details:  Continue HEP.   Recommending 2-hand support for stairs - possibly laterally, reaching to floor, and walking - do not furniture walk!  Discussed rationale for why knee control is still hard.  Can do tall kneel twist w/ wt and lateral crunches w/ kettlebell in tall kneeling at home. Person educated: Patient Education method: Explanation, Demonstration, Tactile cues, Verbal cues, and Handouts Education comprehension: verbalized understanding, returned demonstration, verbal cues required, tactile cues required, and needs further education  HOME EXERCISE PROGRAM:  Access Code: [email or web address] URL: [email or web address] Date: 10/28/2023 Prepared by: Daved Bull  Exercises - Supine Thoracic Mobilization Towel Roll Vertical with Arm Stretch  - 1 x daily - 5 x weekly - 1 sets - 2 minutes hold - Supine Lower Trunk Rotation  - 1 x daily - 5 x weekly - 2 sets - 10 reps - Seated Gastroc Stretch with Strap  - 1 x daily - 7 x weekly - 2 sets - 60 second hold - Standing Gastroc Stretch at Counter  - 2 x daily - 7 x weekly - 1 sets - 3 reps - 30 sec hold - Heel Sits  - 1 x  daily - 5 x weekly - 1 sets - 12 reps - Tall Kneeling Diagonal Lift with Medicine Ball  - 1 x daily - 5 x weekly - 1 sets - 5-10 reps - Marching Bridge  - 1 x daily - 5 x weekly - 1 sets - 6 reps - Clamshell  - 1 x daily - 5 x weekly - 1-2 sets - 10 reps - Long Sitting Hamstring Stretch  - 1 x daily - 5 x weekly - 1 sets - 2-3 reps - 30-45 seconds hold - Seated Knee Flexion  - 1 x daily - 5 x weekly - 1-2 sets - 4 reps - Seated Hip Abduction with Resistance  - 1 x daily - 5 x weekly - 2 sets - 10 reps - Seated Scapular Retraction  - 1 x daily - 7 x weekly - 3 sets - 10 reps - Single Arm Doorway Pec Stretch at 90 Degrees Abduction  - 1 x daily - 7 x weekly - 1 sets - 3-5 reps - 30 sec each hold - Open Book Chest Stretch on Towel Roll  - 1 x daily - 7 x weekly - 1 sets - 10 reps - Supine Shoulder External Rotation Stretch  - 1 x daily - 7 x weekly - 1-2 sets - 10 reps - Supine Shoulder Internal Rotation Stretch  - 1 x daily - 7 x weekly - 1-2 sets - 10 reps  HEP  updated:  for shoulder strengthening and stretching Access Code: [email or web address] URL: [email or web address] Date: 11/06/2023 Prepared by: Rock Kussmaul  Exercises - Standing Bilateral Shoulder Internal Rotation AAROM with Dowel  - 1 x daily - 7 x weekly - 3 sets - 10 reps - Standing Shoulder Extension with Dowel  - 1 x daily - 7 x weekly - 3 sets - 10 reps - Standing Overhead Shoulder External Rotation Stretch with Towel  - 1 x daily - 7 x weekly - 3 sets - 10 reps - Corner Pec Major Stretch  - 1 x daily - 7 x weekly - 1 sets - 2-3 reps - 15-20 hold - Doorway Pec Stretch at 120 Degrees Abduction  - 1 x daily - 7 x weekly - 1 sets - 2-3 reps - 15 hold  GOALS: Goals reviewed with patient? Yes  SHORT TERM GOALS: Target date: 11/08/2023  PT will review, condense and modify HEP to meet current functional level. Baseline: to be reviewed from previous POC Goal status: Goal met 11-07-23   Patient will obtain cooling vest from [employer]  [employer] to improve ability to continue activity in warmer months. Baseline: PT will fax request to [employer] if pt unable to obtain on his own.  Pt has not followed up and we will do this next visit (6/16) Goal status: IN PROGRESS    [facility] rep and PT to further assess and adjust left AFO if able to improve seating into brace for improved utilization. Baseline: PT contacted [facility] at [facility] and followed up, pt wants Orthotist to come to clinic so will likely be later appt (6/16) Goal status: IN PROGRESS   LONG TERM GOALS: Target date: 12/06/2023   Patient will tolerate low to moderate level cardio focused exercise for >/=10 minutes w/ RPE of no more than 5/10 in order to demonstrate tolerance to steady state activity. Baseline: 10 minutes RPE 0/10 (7/24) Goal status: MET  Patient will verbalize strategies for energy conservation to improve compliance to safety recommendations. Baseline: Reviewed with teachback (7/24) Goal status: MET  Patient will report no right shoulder pain with light activity in order to improve functional use of UE. Baseline: 1-2/10 w/ repetition; continues to have IR discomfort with movement (7/24) Goal status: NOT MET  4.  Patient will demonstrate unsupported standing x30 seconds without LOB in order to improve upright safety and functional participation in ADLs. Baseline:  BLE lockout w/ LOB; 6 seconds w/ flexed posture (7/24)  Goal status:  IN PROGRESS  5.  Patient will demonstrate reach to floor from standing at no more than supervision level to improve functional upright mobility and independence. Baseline:  Cannot reach to floor without BLE collapsing; minA or BUE assistance for balance (7/24)  Goal status:  NOT MET  GOALS (at 2/75 re-cert): Goals reviewed with patient? Yes  SHORT TERM GOALS = LONG TERM GOALS: Target date: 01/17/2024  Patient will report no right shoulder pain with light activity in order to improve functional use of UE. Baseline:  1-2/10 w/ repetition; continues to have IR discomfort with movement (7/24); 1/10 w/ light shoulder rolls and other typical elevation patterns (9/2) Goal status: NOT MET  2.  Patient will demonstrate unsupported standing x30 seconds without LOB in order to improve upright safety and functional participation in ADLs. Baseline:  BLE lockout w/ LOB; 6 seconds w/ flexed posture (7/24); 30 sec RLE hyperextended and trunk mildly flexed w/ L weight shift (9/2)  Goal status:  MET  3.  Patient will  demonstrate reach to floor using no more than single UE support from standing at no more than supervision level to improve functional upright mobility and independence. Baseline:  Cannot reach to floor without BLE collapsing; minA or BUE assistance for balance (7/24); uses RUE on thigh for return to upright SBA (9/2)  Goal status:  MET  GOALS (at 9/2 re-cert): Goals reviewed with patient? Yes  SHORT TERM GOALS: Target date: 02/21/2024  Patient will report no falls in recent 3 weeks to demonstrate improved R knee stability. Baseline: 2 in recent week from R knee giving out (9/2) Goal status: INITIAL  LONG TERM GOALS: Target date: 03/13/2024  Patient will report scheduled follow-ups with PCP and ortho MD regarding hitting head and R knee instability for improved safety and medical management. Baseline: Pt canceled ortho follow-up prior (9/2) Goal status: INITIAL  2.  Patient will demonstrate unsupported standing x60 seconds without LOB and improved symmetry of weight bearing in order to improve upright safety and functional participation in ADLs. Baseline:  BLE lockout w/ LOB; 6 seconds w/ flexed posture (7/24); 30 sec RLE hyperextended and trunk mildly flexed w/ L weight shift (9/2)  Goal status:  REVISED  3.  Patient will return to upright from floor reach without UE support to demonstrate improved BLE stability and righting reactions. Baseline:  Cannot reach to floor without BLE collapsing; minA or  BUE assistance for balance (7/24); uses RUE on thigh for return to upright SBA (9/2)  Goal status:  REVISED  ASSESSMENT:  CLINICAL IMPRESSION: Pt continues to have ongoing knee pain with no orthopedic follow-up to his initial visit.  Did encourage him to speak with PCP regarding ongoing issues and importance of preserving weight bearing tolerance and stability.  Focus of skilled session variable today continuing to address shoulder and periscapular stability, core engagement, and standing balance.  He continues to be limited by poor knee control in unsupported standing.  Pt did not wear AFOs today per preference which he was educated on benefits to knee positioning when worn.  Will continue per POC.  OBJECTIVE IMPAIRMENTS: Abnormal gait, cardiopulmonary status limiting activity, decreased activity tolerance, decreased balance, decreased cognition, decreased coordination, decreased endurance, decreased knowledge of condition, decreased knowledge of use of DME, decreased mobility, difficulty walking, decreased ROM, decreased strength, decreased safety awareness, impaired perceived functional ability, increased muscle spasms, impaired sensation, impaired tone, improper body mechanics, and pain  ACTIVITY LIMITATIONS: carrying, lifting, bending, standing, squatting, stairs, transfers, reach over head, locomotion level, and caring for others  PARTICIPATION LIMITATIONS: cleaning, driving, shopping, community activity, occupation, and yard work  PERSONAL FACTORS: Behavior pattern, Education, Fitness, Past/current experiences, and 1-2 comorbidities: MS and significant cardiac involvement are also affecting patient's functional outcome.   REHAB POTENTIAL: Fair see personal factors and PMH  CLINICAL DECISION MAKING: Unstable/unpredictable  EVALUATION COMPLEXITY: High  PLAN:  PT FREQUENCY: 1-2x/week (2x/wk for initial 4 wks and 1x/wk for last 4 wks) + 1x/wk  PT DURATION: 8 weeks + 6 wks  PLANNED  INTERVENTIONS: [id number]- PT Re-evaluation, 97750- Physical Performance Testing, 97110-Therapeutic exercises, 97530- Therapeutic activity, [id number]- Neuromuscular re-education, 97535- Self Care, [id number]- Manual therapy, [id number]- Gait training, [phone]- Orthotic Initial, [id number]- Orthotic/Prosthetic subsequent, [id number]- Aquatic Therapy, [phone]- Electrical stimulation (manual), Patient/Family education, Balance training, Stair training, Taping, Dry Needling, Joint mobilization, DME instructions, Wheelchair mobility training, Cryotherapy, and Moist heat  PLAN FOR NEXT SESSION:  cont. With cardio activities/exercises  Reaching to floor level.  Standing from low/compliant surfaces - this has recently become difficult for  him.  Standing balance. Did he schedule [employer] appt?  Cardiac focus - modified BORG response and circuit style training.   He is not interested in aquatics.  R knee pain and worsened instability since fall down stairs  Stairs w/ rail and loftstrand for home as he does not have 2 rails on stairs? - he has loftstrands; reaching to lower heights (<11)  Daved KATHEE Bull, PT, DPT  02/18/2024, 10:49 AM

## 2024-02-19 NOTE — Progress Notes (Signed)
Remote ICD Transmission.

## 2024-02-22 ENCOUNTER — Other Ambulatory Visit (HOSPITAL_COMMUNITY): Payer: Self-pay | Admitting: Family Medicine

## 2024-02-22 DIAGNOSIS — I5022 Chronic systolic (congestive) heart failure: Secondary | ICD-10-CM

## 2024-02-24 NOTE — Addendum Note (Signed)
 Encounter addended by: Thayne Consuelo DEL on: 02/24/2024 9:26 AM  Actions taken: Imaging Exam ended

## 2024-02-26 ENCOUNTER — Ambulatory Visit: Admitting: Physical Therapy

## 2024-02-29 ENCOUNTER — Other Ambulatory Visit: Payer: Self-pay | Admitting: Internal Medicine

## 2024-02-29 DIAGNOSIS — R0981 Nasal congestion: Secondary | ICD-10-CM

## 2024-03-03 ENCOUNTER — Encounter: Payer: Self-pay | Admitting: Physical Therapy

## 2024-03-03 ENCOUNTER — Ambulatory Visit: Attending: Physician Assistant | Admitting: Physical Therapy

## 2024-03-03 DIAGNOSIS — M25511 Pain in right shoulder: Secondary | ICD-10-CM | POA: Insufficient documentation

## 2024-03-03 DIAGNOSIS — M6281 Muscle weakness (generalized): Secondary | ICD-10-CM | POA: Diagnosis not present

## 2024-03-03 DIAGNOSIS — G8929 Other chronic pain: Secondary | ICD-10-CM | POA: Insufficient documentation

## 2024-03-03 DIAGNOSIS — R2681 Unsteadiness on feet: Secondary | ICD-10-CM | POA: Diagnosis not present

## 2024-03-03 DIAGNOSIS — M21372 Foot drop, left foot: Secondary | ICD-10-CM | POA: Insufficient documentation

## 2024-03-03 DIAGNOSIS — M21371 Foot drop, right foot: Secondary | ICD-10-CM | POA: Diagnosis not present

## 2024-03-03 DIAGNOSIS — R2689 Other abnormalities of gait and mobility: Secondary | ICD-10-CM | POA: Diagnosis not present

## 2024-03-03 DIAGNOSIS — R29818 Other symptoms and signs involving the nervous system: Secondary | ICD-10-CM | POA: Insufficient documentation

## 2024-03-03 DIAGNOSIS — R29898 Other symptoms and signs involving the musculoskeletal system: Secondary | ICD-10-CM | POA: Insufficient documentation

## 2024-03-03 DIAGNOSIS — R262 Difficulty in walking, not elsewhere classified: Secondary | ICD-10-CM | POA: Insufficient documentation

## 2024-03-03 NOTE — Therapy (Signed)
 OUTPATIENT PHYSICAL THERAPY NEURO TREATMENT   Patient Name: Johnathan Ross MRN: [id number] DOB:18-Mar-1971, 53 y.o., male Today's Date: 03/03/2024   PCP: [name], MD  REFERRING PROVIDER: Pegge Toribio PARAS, PA-C  END OF SESSION:  PT End of Session - 03/03/24 0944     Visit Number 18    Number of Visits 22   16+6   Date for Recertification  03/27/24   pushed out due to scheduling delay at re-cert   Authorization Type [employer]    Authorization Time Period 10-08-23 - 12-20-23; 4 more visits 12/19/23 - 01/31/24; Approved 6 PT visits 01/28/2024 - 03/27/2024    Authorization - Number of Visits 22    Progress Note Due on Visit 20    PT Start Time 0935    PT Stop Time 1014    PT Time Calculation (min) 39 min    Equipment Utilized During Treatment Gait belt    Activity Tolerance Patient tolerated treatment well    Behavior During Therapy WFL for tasks assessed/performed              Past Medical History:  Diagnosis Date   Hyperlipidemia    Hypertension    Hypogonadism in male 11/01/2016   Hypothyroidism    Multiple sclerosis    Neuromuscular disorder (HCC)    Proliferative diabetic retinopathy(362.02)    Type 1 diabetes mellitus (HCC) dx'd 1994   Vitamin D  insufficiency 11/29/2016   Past Surgical History:  Procedure Laterality Date   EYE SURGERY Bilateral    laser for diabetic retinopathy   FRACTURE SURGERY     ICD IMPLANT N/A 08/30/2023   Procedure: ICD IMPLANT;  Surgeon: Inocencio Soyla Lunger, MD;  Location: [facility];  Service: Cardiovascular;  Laterality: N/A;   IR VENO/EXT/UNI LEFT  03/24/2020   OPEN REDUCTION INTERNAL FIXATION (ORIF) TIBIA/FIBULA FRACTURE Right 10/30/2016   ORIF ANKLE FRACTURE Left 2015   ORIF TIBIA FRACTURE Right 10/30/2016   Procedure: OPEN REDUCTION INTERNAL FIXATION (ORIF) TIBIA FIBULA FRACTURE;  Surgeon: Celena Sharper, MD;  Location: [facility];  Service: Orthopedics;  Laterality: Right;   RETINAL LASER PROCEDURE Bilateral     for diabetic retinopathy   RIGHT/LEFT HEART CATH AND CORONARY ANGIOGRAPHY N/A 08/26/2023   Procedure: RIGHT/LEFT HEART CATH AND CORONARY ANGIOGRAPHY;  Surgeon: Gardenia Led, DO;  Location: [facility];  Service: Cardiovascular;  Laterality: N/A;   Patient Active Problem List   Diagnosis Date Noted   Mass of right lower leg 11/13/2023   Attention and concentration deficit 09/10/2023   Need for immunization against influenza 06/17/2023   Loud snoring 06/17/2023   Encounter for general adult medical examination with abnormal findings 08/02/2022   Screening for prostate cancer 08/01/2021   OAB (overactive bladder) 09/21/2020   Essential hypertension 09/21/2020   Thoracic outlet syndrome 05/03/2020   Controlled type 1 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 (HCC) 03/24/2020   Right epiretinal membrane 03/24/2020   Retinal exudates and deposits 03/24/2020   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   Nuclear sclerotic cataract of both eyes 03/24/2020   Gait disorder 03/23/2020   Benign prostatic hyperplasia without lower urinary tract symptoms 03/01/2020   Diabetic gastroparesis associated with type 1 diabetes mellitus (HCC) 03/01/2020   Gait disturbance 08/19/2019   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/19/2019   Vitamin D  insufficiency 11/29/2016   Hypogonadism in male 11/01/2016   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HCC)    Hypothyroidism    Hypertension  Dyslipidemia, goal LDL below 130    MULTIPLE SCLEROSIS 08/29/2007   PROLIFERATIVE DIABETIC RETINOPATHY 08/29/2007    ONSET DATE: 08/26/2023  REFERRING DIAG: G93.40 (ICD-10-CM) - Acute encephalopathy  THERAPY DIAG:  Unsteadiness on feet  Other abnormalities of gait and mobility  Muscle weakness (generalized)  Other symptoms and signs involving the nervous system  Other symptoms and signs involving the musculoskeletal system  Difficulty in walking, not elsewhere  classified  Foot drop, right  Foot drop, left  Chronic right shoulder pain  Rationale for Evaluation and Treatment: Rehabilitation  SUBJECTIVE:                                                                                                                                                                                             SUBJECTIVE STATEMENT: Pt ambulates into gym today with 2WW using intermittent reciprocal pattern.  Knee pain is slowly resolving and he has spoken to PCP about follow-up without scheduling appt.  No falls or acute changes.  Pt accompanied by: self  PERTINENT HISTORY: vitamin D  insufficiency, Type 1 IDDM w/ polyneuropathy, diabetic retinopathy, hypothyroidism, HTN, hypercholestrolemia, h/o Rt tibia fracture, h/o Lt ankle fracture with ORIF, MS with acquired diplegia (MS diagnosis 2014/2015)  In the week prior to his cardiac arrest he reported having some near syncopal episodes.  On 3/31 he went into witnessed cardiac arrest w/ bystander performing CPR for 20-25 minutes.  Per EMS notes he had persistent V-fib requiring 8 total defibrillations, 2 doses of epi and some ROSC w/ V tach.  He was also given Narcan in the field due to small pupils.  PAIN:  Are you having pain? No  PRECAUTIONS: Fall and ICD/Pacemaker (Can raise R shoulder as of May 9th - 6wks post placement of ICD; monitor BP - if systolic drops below 100 he is to follow-up with Cardiologist about medication management)  RED FLAGS: None   WEIGHT BEARING RESTRICTIONS: No  FALLS: Has patient fallen in last 6 months? No  LIVING ENVIRONMENT: Lives with: lives with their partner Lives in: House/apartment Stairs: Yes: External: 5 steps; on right going up, on left going up, and can reach both Has following equipment at home: Single point cane, Walker - 2 wheeled, Wheelchair (manual), and shower chair  PLOF: Requires assistive device for independence and Needs assistance with homemaking  PATIENT  GOALS: fix my brace and if there's anything we can do for my shoulder   OBJECTIVE:  Note: Objective measures were completed at Evaluation unless otherwise noted.  DIAGNOSTIC FINDINGS:  MRI Cardiac 08/29/2023: IMPRESSION: 1. Mild to moderate decrease in left ventricular systolic function (LVEF =40%).  2. There is no late gadolinium enhancement in the left ventricular myocardium. Normal parametric mapping and tissue characterization.   3. Mild decrease in right ventricular systolic function (RVEF =45%).  CT Head 08/26/2023: IMPRESSION: 1. No evidence of acute intracranial abnormality. 2. White matter lesions better characterized on prior MRI.  CT Angio Chest PE 08/26/2023: IMPRESSION: 1. No pulmonary embolism. 2. Moderate left anterior descending coronary artery calcification. 3. Extensive dependent pulmonary consolidation throughout the lungs which may relate to aspiration or multifocal infection.  *Updated imaging ordered by Dr. Vear (MRI of brain and C-spine)   COGNITION: Overall cognitive status: Within functional limits for tasks assessed   SENSATION: Light touch: WFL   MUSCLE TONE: Increased extensor tone of LLE >RLE but ROM and coordination limited bilaterally due to extensor tone/spasticity    POSTURE: rounded shoulders, forward head, and weight shift right  LOWER EXTREMITY ROM:   Limited by extensor tone/spasticity   Active  Right Eval Left Eval  Hip flexion    Hip extension    Hip abduction    Hip adduction    Hip internal rotation    Hip external rotation    Knee flexion    Knee extension    Ankle dorsiflexion    Ankle plantarflexion    Ankle inversion    Ankle eversion     (Blank rows = not tested)  LOWER EXTREMITY MMT:  Tested in seated position   MMT Right Eval Left Eval  Hip flexion 1 1  Hip extension    Hip abduction 4- 4-  Hip adduction    Hip internal rotation    Hip external rotation    Knee flexion 2 3-  Knee extension 3+ 1   Ankle dorsiflexion AFO donned AFO donned  Ankle plantarflexion    Ankle inversion    Ankle eversion    (Blank rows = not tested)  BED MOBILITY:  Independent per pt - still in a standard bed, needs increased time to roll L and R  TRANSFERS: Assistive device utilized: Environmental consultant - 2 wheeled  Sit to stand: SBA Stand to sit: SBA Heavy reliance on BUEs to essentially throw himself out of chair, wide BOS, better hip extension today when standing to 2WW, when he lets go he maintains anterior lean in attempt to prevent knees locking out, when knees lock out he loses his balance.   GAIT: Gait pattern: step to pattern, decreased step length- Left, decreased stride length, decreased hip/knee flexion- Right, decreased hip/knee flexion- Left, decreased ankle dorsiflexion- Right, decreased ankle dorsiflexion- Left, lateral hip instability, trunk flexed, narrow BOS, and poor foot clearance- Left Distance walked: Various clinic distances  Assistive device utilized: Walker - 2 wheeled Level of assistance: SBA Comments: Pt required 9 minutes to ambulate into treatment room (~30') , frequently stopping to rest as his legs lock up. Noted excessive inversion of L ankle as well as scissoring of LLE. Pt relying heavily on BUEs to perform more of a swing-to pattern.   FUNCTIONAL TESTS:  TREATMENT: 03/03/24  TE: -Arm bike x4 minutes forward > x4 minute backwards on level 6.0 for postural awareness and shoulder and periscapular stability.  Cued to decrease back support for increased core challenge throughout task.  NMR: Dyke pulls in unsupported squatted stance into semi-upright for low to high in vertical and diagonals using RUE on R thigh (3 rounds total), intermittent CGA-minA to prevent forward LOB w/ noted posterior chain activation  Gait: STAIRS:  Level of Assistance: SBA  and CGA  Stair Negotiation Technique: Step to Pattern Sideways Forwards with Single Rail on Left Bilateral Rails  Number of Stairs: 2x4   Height of Stairs: 6  Comments: Pt has significant posterior lean when using RUE to advance RLE to next step during lateral facing ascent w/ L rail only.  No significant LOB, but edu on using forward lean to stabilize if using stairs w/ single rail, but would recommend using bilateral rails as able for added stability and improved foot positioning especially on descent.  PATIENT EDUCATION: Education details:  Continue HEP.   Recommending 2-hand support for stairs - possibly laterally, reaching to floor, and walking - do not furniture walk!  Ongoing gait mechanics to improve fluidity and unlocking L knee - shifting forward onto RLE/AFO and maintaining steady momentum of weight shifts forward. Person educated: Patient Education method: Explanation, Demonstration, Tactile cues, Verbal cues, and Handouts Education comprehension: verbalized understanding, returned demonstration, verbal cues required, tactile cues required, and needs further education  HOME EXERCISE PROGRAM:  Access Code: [email or web address] URL: [email or web address] Date: 10/28/2023 Prepared by: Daved Bull  Exercises - Supine Thoracic Mobilization Towel Roll Vertical with Arm Stretch  - 1 x daily - 5 x weekly - 1 sets - 2 minutes hold - Supine Lower Trunk Rotation  - 1 x daily - 5 x weekly - 2 sets - 10 reps - Seated Gastroc Stretch with Strap  - 1 x daily - 7 x weekly - 2 sets - 60 second hold - Standing Gastroc Stretch at Counter  - 2 x daily - 7 x weekly - 1 sets - 3 reps - 30 sec hold - Heel Sits  - 1 x daily - 5 x weekly - 1 sets - 12 reps - Tall Kneeling Diagonal Lift with Medicine Ball  - 1 x daily - 5 x weekly - 1 sets - 5-10 reps - Marching Bridge  - 1 x daily - 5 x weekly - 1 sets - 6 reps - Clamshell  - 1 x daily - 5 x weekly - 1-2 sets - 10 reps - Long Sitting  Hamstring Stretch  - 1 x daily - 5 x weekly - 1 sets - 2-3 reps - 30-45 seconds hold - Seated Knee Flexion  - 1 x daily - 5 x weekly - 1-2 sets - 4 reps - Seated Hip Abduction with Resistance  - 1 x daily - 5 x weekly - 2 sets - 10 reps - Seated Scapular Retraction  - 1 x daily - 7 x weekly - 3 sets - 10 reps - Single Arm Doorway Pec Stretch at 90 Degrees Abduction  - 1 x daily - 7 x weekly - 1 sets - 3-5 reps - 30 sec each hold - Open Book Chest Stretch on Towel Roll  - 1 x daily - 7 x weekly - 1 sets - 10 reps - Supine Shoulder External Rotation Stretch  - 1 x daily - 7 x weekly - 1-2 sets - 10 reps - Supine Shoulder  Internal Rotation Stretch  - 1 x daily - 7 x weekly - 1-2 sets - 10 reps  HEP updated:  for shoulder strengthening and stretching Access Code: [email or web address] URL: [email or web address] Date: 11/06/2023 Prepared by: Rock Kussmaul  Exercises - Standing Bilateral Shoulder Internal Rotation AAROM with Dowel  - 1 x daily - 7 x weekly - 3 sets - 10 reps - Standing Shoulder Extension with Dowel  - 1 x daily - 7 x weekly - 3 sets - 10 reps - Standing Overhead Shoulder External Rotation Stretch with Towel  - 1 x daily - 7 x weekly - 3 sets - 10 reps - Corner Pec Major Stretch  - 1 x daily - 7 x weekly - 1 sets - 2-3 reps - 15-20 hold - Doorway Pec Stretch at 120 Degrees Abduction  - 1 x daily - 7 x weekly - 1 sets - 2-3 reps - 15 hold  GOALS: Goals reviewed with patient? Yes  SHORT TERM GOALS: Target date: 11/08/2023  PT will review, condense and modify HEP to meet current functional level. Baseline: to be reviewed from previous POC Goal status: Goal met 11-07-23   Patient will obtain cooling vest from [employer] to improve ability to continue activity in warmer months. Baseline: PT will fax request to [employer] if pt unable to obtain on his own.  Pt has not followed up and we will do this next visit (6/16) Goal status: IN PROGRESS    [facility] rep and PT to further  assess and adjust left AFO if able to improve seating into brace for improved utilization. Baseline: PT contacted Olivia at [facility] and followed up, pt wants Orthotist to come to clinic so will likely be later appt (6/16) Goal status: IN PROGRESS   LONG TERM GOALS: Target date: 12/06/2023   Patient will tolerate low to moderate level cardio focused exercise for >/=10 minutes w/ RPE of no more than 5/10 in order to demonstrate tolerance to steady state activity. Baseline: 10 minutes RPE 0/10 (7/24) Goal status: MET  Patient will verbalize strategies for energy conservation to improve compliance to safety recommendations. Baseline: Reviewed with teachback (7/24) Goal status: MET  Patient will report no right shoulder pain with light activity in order to improve functional use of UE. Baseline: 1-2/10 w/ repetition; continues to have IR discomfort with movement (7/24) Goal status: NOT MET  4.  Patient will demonstrate unsupported standing x30 seconds without LOB in order to improve upright safety and functional participation in ADLs. Baseline:  BLE lockout w/ LOB; 6 seconds w/ flexed posture (7/24)  Goal status:  IN PROGRESS  5.  Patient will demonstrate reach to floor from standing at no more than supervision level to improve functional upright mobility and independence. Baseline:  Cannot reach to floor without BLE collapsing; minA or BUE assistance for balance (7/24)  Goal status:  NOT MET  GOALS (at 2/75 re-cert): Goals reviewed with patient? Yes  SHORT TERM GOALS = LONG TERM GOALS: Target date: 01/17/2024  Patient will report no right shoulder pain with light activity in order to improve functional use of UE. Baseline: 1-2/10 w/ repetition; continues to have IR discomfort with movement (7/24); 1/10 w/ light shoulder rolls and other typical elevation patterns (9/2) Goal status: NOT MET  2.  Patient will demonstrate unsupported standing x30 seconds without LOB in order to improve  upright safety and functional participation in ADLs. Baseline:  BLE lockout w/ LOB; 6 seconds w/ flexed posture (7/24); 30 sec RLE  hyperextended and trunk mildly flexed w/ L weight shift (9/2)  Goal status:  MET  3.  Patient will demonstrate reach to floor using no more than single UE support from standing at no more than supervision level to improve functional upright mobility and independence. Baseline:  Cannot reach to floor without BLE collapsing; minA or BUE assistance for balance (7/24); uses RUE on thigh for return to upright SBA (9/2)  Goal status:  MET  GOALS (at 9/2 re-cert): Goals reviewed with patient? Yes  SHORT TERM GOALS: Target date: 02/21/2024  Patient will report no falls in recent 3 weeks to demonstrate improved R knee stability. Baseline: 2 in recent week from R knee giving out (9/2) Goal status: INITIAL  LONG TERM GOALS: Target date: 03/13/2024  Patient will report scheduled follow-ups with PCP and ortho MD regarding hitting head and R knee instability for improved safety and medical management. Baseline: Pt canceled ortho follow-up prior (9/2) Goal status: INITIAL  2.  Patient will demonstrate unsupported standing x60 seconds without LOB and improved symmetry of weight bearing in order to improve upright safety and functional participation in ADLs. Baseline:  BLE lockout w/ LOB; 6 seconds w/ flexed posture (7/24); 30 sec RLE hyperextended and trunk mildly flexed w/ L weight shift (9/2)  Goal status:  REVISED  3.  Patient will return to upright from floor reach without UE support to demonstrate improved BLE stability and righting reactions. Baseline:  Cannot reach to floor without BLE collapsing; minA or BUE assistance for balance (7/24); uses RUE on thigh for return to upright SBA (9/2)  Goal status:  REVISED  ASSESSMENT:  CLINICAL IMPRESSION: Focus of skilled session today on addressing postural control, static balance, forward reach, and stair management as  these remain pt priority for functionality.  He demonstrates better attempts at reciprocal mobility today and ongoing education regarding normal fluctuation based on variable factors impacting MS.  His knee pain is improving slowly, but he does still feel more hyperextension and general instability than prior to his fall.  Did not attempt loftstrand crutch with stairs today as pt has access to stairs w/ 2 rails and is able to use single rail w/ increased upper trunk support which loftstrand will not provide.  Will continue per POC.  OBJECTIVE IMPAIRMENTS: Abnormal gait, cardiopulmonary status limiting activity, decreased activity tolerance, decreased balance, decreased cognition, decreased coordination, decreased endurance, decreased knowledge of condition, decreased knowledge of use of DME, decreased mobility, difficulty walking, decreased ROM, decreased strength, decreased safety awareness, impaired perceived functional ability, increased muscle spasms, impaired sensation, impaired tone, improper body mechanics, and pain  ACTIVITY LIMITATIONS: carrying, lifting, bending, standing, squatting, stairs, transfers, reach over head, locomotion level, and caring for others  PARTICIPATION LIMITATIONS: cleaning, driving, shopping, community activity, occupation, and yard work  PERSONAL FACTORS: Behavior pattern, Education, Fitness, Past/current experiences, and 1-2 comorbidities: MS and significant cardiac involvement are also affecting patient's functional outcome.   REHAB POTENTIAL: Fair see personal factors and PMH  CLINICAL DECISION MAKING: Unstable/unpredictable  EVALUATION COMPLEXITY: High  PLAN:  PT FREQUENCY: 1-2x/week (2x/wk for initial 4 wks and 1x/wk for last 4 wks) + 1x/wk  PT DURATION: 8 weeks + 6 wks  PLANNED INTERVENTIONS: [id number]- PT Re-evaluation, 97750- Physical Performance Testing, 97110-Therapeutic exercises, 97530- Therapeutic activity, [id number]- Neuromuscular re-education, 97535- Self  Care, [id number]- Manual therapy, [id number]- Gait training, [phone]- Orthotic Initial, [id number]- Orthotic/Prosthetic subsequent, [id number]- Aquatic Therapy, [phone]- Electrical stimulation (manual), Patient/Family education, Balance training, Stair training, Taping, Dry Needling, Joint mobilization, DME instructions,  Wheelchair mobility training, Cryotherapy, and Moist heat  PLAN FOR NEXT SESSION:  cont. With cardio activities/exercises  Reaching to floor level.  Standing from low/compliant surfaces - this has recently become difficult for him.  Standing balance. Did he schedule [employer] appt?  Cardiac focus - modified BORG response and circuit style training.   He is not interested in aquatics. reaching to lower heights (<11) - pt wants to repeat reaching from [facility]  Daved KATHEE Bull, PT, DPT  03/03/2024, 12:13 PM

## 2024-03-10 ENCOUNTER — Encounter: Payer: Self-pay | Admitting: Physical Therapy

## 2024-03-10 ENCOUNTER — Ambulatory Visit: Admitting: Physical Therapy

## 2024-03-10 DIAGNOSIS — M21371 Foot drop, right foot: Secondary | ICD-10-CM | POA: Diagnosis not present

## 2024-03-10 DIAGNOSIS — R29898 Other symptoms and signs involving the musculoskeletal system: Secondary | ICD-10-CM | POA: Diagnosis not present

## 2024-03-10 DIAGNOSIS — R262 Difficulty in walking, not elsewhere classified: Secondary | ICD-10-CM | POA: Diagnosis not present

## 2024-03-10 DIAGNOSIS — M6281 Muscle weakness (generalized): Secondary | ICD-10-CM | POA: Diagnosis not present

## 2024-03-10 DIAGNOSIS — G8929 Other chronic pain: Secondary | ICD-10-CM

## 2024-03-10 DIAGNOSIS — R2681 Unsteadiness on feet: Secondary | ICD-10-CM | POA: Diagnosis not present

## 2024-03-10 DIAGNOSIS — M21372 Foot drop, left foot: Secondary | ICD-10-CM

## 2024-03-10 DIAGNOSIS — R29818 Other symptoms and signs involving the nervous system: Secondary | ICD-10-CM

## 2024-03-10 DIAGNOSIS — M25511 Pain in right shoulder: Secondary | ICD-10-CM | POA: Diagnosis not present

## 2024-03-10 DIAGNOSIS — R2689 Other abnormalities of gait and mobility: Secondary | ICD-10-CM | POA: Diagnosis not present

## 2024-03-10 NOTE — Therapy (Signed)
 OUTPATIENT PHYSICAL THERAPY NEURO TREATMENT   Patient Name: Johnathan Ross MRN: [id number] DOB:1970-11-25, 53 y.o., male Today's Date: 03/10/2024   PCP: [name], MD  REFERRING PROVIDER: Pegge Toribio PARAS, PA-C  END OF SESSION:  PT End of Session - 03/10/24 0940     Visit Number 19    Number of Visits 22   16+6   Date for Recertification  03/27/24   pushed out due to scheduling delay at re-cert   Authorization Type [employer]    Authorization Time Period 10-08-23 - 12-20-23; 4 more visits 12/19/23 - 01/31/24; Approved 6 PT visits 01/28/2024 - 03/27/2024    Authorization - Number of Visits 22    Progress Note Due on Visit 20    PT Start Time 0934    PT Stop Time 1017    PT Time Calculation (min) 43 min    Equipment Utilized During Treatment Gait belt    Activity Tolerance Patient tolerated treatment well    Behavior During Therapy [facility] for tasks assessed/performed              Past Medical History:  Diagnosis Date   Hyperlipidemia    Hypertension    Hypogonadism in male 11/01/2016   Hypothyroidism    Multiple sclerosis    Neuromuscular disorder (HCC)    Proliferative diabetic retinopathy(362.02)    Type 1 diabetes mellitus (HCC) dx'd 1994   Vitamin D  insufficiency 11/29/2016   Past Surgical History:  Procedure Laterality Date   EYE SURGERY Bilateral    laser for diabetic retinopathy   FRACTURE SURGERY     ICD IMPLANT N/A 08/30/2023   Procedure: ICD IMPLANT;  Surgeon: Inocencio Soyla Lunger, MD;  Location: [facility] IN[facility];  Service: Cardiovascular;  Laterality: N/A;   IR VENO/EXT/UNI LEFT  03/24/2020   OPEN REDUCTION INTERNAL FIXATION (ORIF) TIBIA/FIBULA FRACTURE Right 10/30/2016   ORIF ANKLE FRACTURE Left 2015   ORIF TIBIA FRACTURE Right 10/30/2016   Procedure: OPEN REDUCTION INTERNAL FIXATION (ORIF) TIBIA FIBULA FRACTURE;  Surgeon: Celena Sharper, MD;  Location: [facility] OR;  Service: Orthopedics;  Laterality: Right;   RETINAL LASER PROCEDURE  Bilateral    for diabetic retinopathy   RIGHT/LEFT HEART CATH AND CORONARY ANGIOGRAPHY N/A 08/26/2023   Procedure: RIGHT/LEFT HEART CATH AND CORONARY ANGIOGRAPHY;  Surgeon: Gardenia Led, DO;  Location: [facility];  Service: Cardiovascular;  Laterality: N/A;   Patient Active Problem List   Diagnosis Date Noted   Mass of right lower leg 11/13/2023   Attention and concentration deficit 09/10/2023   Need for immunization against influenza 06/17/2023   Loud snoring 06/17/2023   Encounter for general adult medical examination with abnormal findings 08/02/2022   Screening for prostate cancer 08/01/2021   OAB (overactive bladder) 09/21/2020   Essential hypertension 09/21/2020   Thoracic outlet syndrome 05/03/2020   Controlled type 1 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 (HCC) 03/24/2020   Right epiretinal membrane 03/24/2020   Retinal exudates and deposits 03/24/2020   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   Nuclear sclerotic cataract of both eyes 03/24/2020   Gait disorder 03/23/2020   Benign prostatic hyperplasia without lower urinary tract symptoms 03/01/2020   Diabetic gastroparesis associated with type 1 diabetes mellitus (HCC) 03/01/2020   Gait disturbance 08/19/2019   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/19/2019   Vitamin D  insufficiency 11/29/2016   Hypogonadism in male 11/01/2016   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HCC)    Hypothyroidism    Hypertension  Dyslipidemia, goal LDL below 130    MULTIPLE SCLEROSIS 08/29/2007   PROLIFERATIVE DIABETIC RETINOPATHY 08/29/2007    ONSET DATE: 08/26/2023  REFERRING DIAG: G93.40 (ICD-10-CM) - Acute encephalopathy  THERAPY DIAG:  Unsteadiness on feet  Other abnormalities of gait and mobility  Muscle weakness (generalized)  Other symptoms and signs involving the nervous system  Other symptoms and signs involving the musculoskeletal system  Difficulty in walking, not  elsewhere classified  Foot drop, right  Foot drop, left  Chronic right shoulder pain  Rationale for Evaluation and Treatment: Rehabilitation  SUBJECTIVE:                                                                                                                                                                                             SUBJECTIVE STATEMENT: Pt ambulates into gym today with 2WW using intermittent reciprocal pattern.  No knee or shoulder pain at current.  No falls or acute changes.  Pt accompanied by: self  PERTINENT HISTORY: vitamin D  insufficiency, Type 1 IDDM w/ polyneuropathy, diabetic retinopathy, hypothyroidism, HTN, hypercholestrolemia, h/o Rt tibia fracture, h/o Lt ankle fracture with ORIF, MS with acquired diplegia (MS diagnosis 2014/2015)  In the week prior to his cardiac arrest he reported having some near syncopal episodes.  On 3/31 he went into witnessed cardiac arrest w/ bystander performing CPR for 20-25 minutes.  Per EMS notes he had persistent V-fib requiring 8 total defibrillations, 2 doses of epi and some ROSC w/ V tach.  He was also given Narcan in the field due to small pupils.  PAIN:  Are you having pain? No  PRECAUTIONS: Fall and ICD/Pacemaker (Can raise R shoulder as of May 9th - 6wks post placement of ICD; monitor BP - if systolic drops below 100 he is to follow-up with Cardiologist about medication management)  RED FLAGS: None   WEIGHT BEARING RESTRICTIONS: No  FALLS: Has patient fallen in last 6 months? No  LIVING ENVIRONMENT: Lives with: lives with their partner Lives in: House/apartment Stairs: Yes: External: 5 steps; on right going up, on left going up, and can reach both Has following equipment at home: Single point cane, Walker - 2 wheeled, Wheelchair (manual), and shower chair  PLOF: Requires assistive device for independence and Needs assistance with homemaking  PATIENT GOALS: fix my brace and if there's anything we can do  for my shoulder   OBJECTIVE:  Note: Objective measures were completed at Evaluation unless otherwise noted.  DIAGNOSTIC FINDINGS:  MRI Cardiac 08/29/2023: IMPRESSION: 1. Mild to moderate decrease in left ventricular systolic function (LVEF =40%).   2. There is no late gadolinium enhancement in  the left ventricular myocardium. Normal parametric mapping and tissue characterization.   3. Mild decrease in right ventricular systolic function (RVEF =45%).  CT Head 08/26/2023: IMPRESSION: 1. No evidence of acute intracranial abnormality. 2. White matter lesions better characterized on prior MRI.  CT Angio Chest PE 08/26/2023: IMPRESSION: 1. No pulmonary embolism. 2. Moderate left anterior descending coronary artery calcification. 3. Extensive dependent pulmonary consolidation throughout the lungs which may relate to aspiration or multifocal infection.  *Updated imaging ordered by Dr. Vear (MRI of brain and C-spine)   COGNITION: Overall cognitive status: Within functional limits for tasks assessed   SENSATION: Light touch: WFL   MUSCLE TONE: Increased extensor tone of LLE >RLE but ROM and coordination limited bilaterally due to extensor tone/spasticity    POSTURE: rounded shoulders, forward head, and weight shift right  LOWER EXTREMITY ROM:   Limited by extensor tone/spasticity   Active  Right Eval Left Eval  Hip flexion    Hip extension    Hip abduction    Hip adduction    Hip internal rotation    Hip external rotation    Knee flexion    Knee extension    Ankle dorsiflexion    Ankle plantarflexion    Ankle inversion    Ankle eversion     (Blank rows = not tested)  LOWER EXTREMITY MMT:  Tested in seated position   MMT Right Eval Left Eval  Hip flexion 1 1  Hip extension    Hip abduction 4- 4-  Hip adduction    Hip internal rotation    Hip external rotation    Knee flexion 2 3-  Knee extension 3+ 1  Ankle dorsiflexion AFO donned AFO donned  Ankle  plantarflexion    Ankle inversion    Ankle eversion    (Blank rows = not tested)  BED MOBILITY:  Independent per pt - still in a standard bed, needs increased time to roll L and R  TRANSFERS: Assistive device utilized: Environmental consultant - 2 wheeled  Sit to stand: SBA Stand to sit: SBA Heavy reliance on BUEs to essentially throw himself out of chair, wide BOS, better hip extension today when standing to 2WW, when he lets go he maintains anterior lean in attempt to prevent knees locking out, when knees lock out he loses his balance.   GAIT: Gait pattern: step to pattern, decreased step length- Left, decreased stride length, decreased hip/knee flexion- Right, decreased hip/knee flexion- Left, decreased ankle dorsiflexion- Right, decreased ankle dorsiflexion- Left, lateral hip instability, trunk flexed, narrow BOS, and poor foot clearance- Left Distance walked: Various clinic distances  Assistive device utilized: Walker - 2 wheeled Level of assistance: SBA Comments: Pt required 9 minutes to ambulate into treatment room (~30') , frequently stopping to rest as his legs lock up. Noted excessive inversion of L ankle as well as scissoring of LLE. Pt relying heavily on BUEs to perform more of a swing-to pattern.   FUNCTIONAL TESTS:  TREATMENT: 03/10/24  NMR: -Unsupported reaching to 10 > 6 > 4 > 2 > floor height varying reps each height alternating UE SBA-CGA, slow return to upright so pt can work on maintaining knee control vs hyperextension -24.5 STS unsupported x15, some attempts needing momentum and multiple attempts to clear bottom, SBA-CGA, no uncontrolled LOB, cued to work on reduced UE support for sitting w/ return demo of modifications  PATIENT EDUCATION: Education details:  Continue HEP.   Discussed safe ways to practice floor reaching mechanics and STS w/  decreased support at home for strengthening and functional transfers. Slow circular motion to unlock Bil knees in unsupported standing. Person educated: Patient Education method: Explanation, Demonstration, Tactile cues, Verbal cues, and Handouts Education comprehension: verbalized understanding, returned demonstration, verbal cues required, tactile cues required, and needs further education  HOME EXERCISE PROGRAM:  Access Code: [email or web address] URL: [email or web address] Date: 10/28/2023 Prepared by: Daved Bull  Exercises - Supine Thoracic Mobilization Towel Roll Vertical with Arm Stretch  - 1 x daily - 5 x weekly - 1 sets - 2 minutes hold - Supine Lower Trunk Rotation  - 1 x daily - 5 x weekly - 2 sets - 10 reps - Seated Gastroc Stretch with Strap  - 1 x daily - 7 x weekly - 2 sets - 60 second hold - Standing Gastroc Stretch at Counter  - 2 x daily - 7 x weekly - 1 sets - 3 reps - 30 sec hold - Heel Sits  - 1 x daily - 5 x weekly - 1 sets - 12 reps - Tall Kneeling Diagonal Lift with Medicine Ball  - 1 x daily - 5 x weekly - 1 sets - 5-10 reps - Marching Bridge  - 1 x daily - 5 x weekly - 1 sets - 6 reps - Clamshell  - 1 x daily - 5 x weekly - 1-2 sets - 10 reps - Long Sitting Hamstring Stretch  - 1 x daily - 5 x weekly - 1 sets - 2-3 reps - 30-45 seconds hold - Seated Knee Flexion  - 1 x daily - 5 x weekly - 1-2 sets - 4 reps - Seated Hip Abduction with Resistance  - 1 x daily - 5 x weekly - 2 sets - 10 reps - Seated Scapular Retraction  - 1 x daily - 7 x weekly - 3 sets - 10 reps - Single Arm Doorway Pec Stretch at 90 Degrees Abduction  - 1 x daily - 7 x weekly - 1 sets - 3-5 reps - 30 sec each hold - Open Book Chest Stretch on Towel Roll  - 1 x daily - 7 x weekly - 1 sets - 10 reps - Supine Shoulder External Rotation Stretch  - 1 x daily - 7 x weekly - 1-2 sets - 10 reps - Supine Shoulder Internal Rotation Stretch  - 1 x daily - 7 x weekly - 1-2 sets - 10 reps  HEP  updated:  for shoulder strengthening and stretching Access Code: [email or web address] URL: [email or web address] Date: 11/06/2023 Prepared by: Rock Kussmaul  Exercises - Standing Bilateral Shoulder Internal Rotation AAROM with Dowel  - 1 x daily - 7 x weekly - 3 sets - 10 reps - Standing Shoulder Extension with Dowel  - 1 x daily - 7 x weekly - 3 sets - 10 reps - Standing Overhead Shoulder External Rotation Stretch with Towel  - 1 x daily - 7 x weekly - 3 sets - 10 reps - Corner Pec  Major Stretch  - 1 x daily - 7 x weekly - 1 sets - 2-3 reps - 15-20 hold - Doorway Pec Stretch at 120 Degrees Abduction  - 1 x daily - 7 x weekly - 1 sets - 2-3 reps - 15 hold  GOALS: Goals reviewed with patient? Yes  SHORT TERM GOALS: Target date: 11/08/2023  PT will review, condense and modify HEP to meet current functional level. Baseline: to be reviewed from previous POC Goal status: Goal met 11-07-23   Patient will obtain cooling vest from [employer] to improve ability to continue activity in warmer months. Baseline: PT will fax request to [employer] if pt unable to obtain on his own.  Pt has not followed up and we will do this next visit (6/16) Goal status: IN PROGRESS    [facility] rep and PT to further assess and adjust left AFO if able to improve seating into brace for improved utilization. Baseline: PT contacted Olivia at [facility] and followed up, pt wants Orthotist to come to clinic so will likely be later appt (6/16) Goal status: IN PROGRESS   LONG TERM GOALS: Target date: 12/06/2023   Patient will tolerate low to moderate level cardio focused exercise for >/=10 minutes w/ RPE of no more than 5/10 in order to demonstrate tolerance to steady state activity. Baseline: 10 minutes RPE 0/10 (7/24) Goal status: MET  Patient will verbalize strategies for energy conservation to improve compliance to safety recommendations. Baseline: Reviewed with teachback (7/24) Goal status: MET  Patient will  report no right shoulder pain with light activity in order to improve functional use of UE. Baseline: 1-2/10 w/ repetition; continues to have IR discomfort with movement (7/24) Goal status: NOT MET  4.  Patient will demonstrate unsupported standing x30 seconds without LOB in order to improve upright safety and functional participation in ADLs. Baseline:  BLE lockout w/ LOB; 6 seconds w/ flexed posture (7/24)  Goal status:  IN PROGRESS  5.  Patient will demonstrate reach to floor from standing at no more than supervision level to improve functional upright mobility and independence. Baseline:  Cannot reach to floor without BLE collapsing; minA or BUE assistance for balance (7/24)  Goal status:  NOT MET  GOALS (at 2/75 re-cert): Goals reviewed with patient? Yes  SHORT TERM GOALS = LONG TERM GOALS: Target date: 01/17/2024  Patient will report no right shoulder pain with light activity in order to improve functional use of UE. Baseline: 1-2/10 w/ repetition; continues to have IR discomfort with movement (7/24); 1/10 w/ light shoulder rolls and other typical elevation patterns (9/2) Goal status: NOT MET  2.  Patient will demonstrate unsupported standing x30 seconds without LOB in order to improve upright safety and functional participation in ADLs. Baseline:  BLE lockout w/ LOB; 6 seconds w/ flexed posture (7/24); 30 sec RLE hyperextended and trunk mildly flexed w/ L weight shift (9/2)  Goal status:  MET  3.  Patient will demonstrate reach to floor using no more than single UE support from standing at no more than supervision level to improve functional upright mobility and independence. Baseline:  Cannot reach to floor without BLE collapsing; minA or BUE assistance for balance (7/24); uses RUE on thigh for return to upright SBA (9/2)  Goal status:  MET  GOALS (at 9/2 re-cert): Goals reviewed with patient? Yes  SHORT TERM GOALS: Target date: 02/21/2024  Patient will report no falls in  recent 3 weeks to demonstrate improved R knee stability. Baseline: 2  in recent week from R knee giving out (9/2) Goal status: INITIAL  LONG TERM GOALS: Target date: 03/13/2024  Patient will report scheduled follow-ups with PCP and ortho MD regarding hitting head and R knee instability for improved safety and medical management. Baseline: Pt canceled ortho follow-up prior (9/2) Goal status: INITIAL  2.  Patient will demonstrate unsupported standing x60 seconds without LOB and improved symmetry of weight bearing in order to improve upright safety and functional participation in ADLs. Baseline:  BLE lockout w/ LOB; 6 seconds w/ flexed posture (7/24); 30 sec RLE hyperextended and trunk mildly flexed w/ L weight shift (9/2)  Goal status:  REVISED  3.  Patient will return to upright from floor reach without UE support to demonstrate improved BLE stability and righting reactions. Baseline:  Cannot reach to floor without BLE collapsing; minA or BUE assistance for balance (7/24); uses RUE on thigh for return to upright SBA (9/2)  Goal status:  REVISED  ASSESSMENT:  CLINICAL IMPRESSION: Focus of skilled session today on working towards floor reaching with improved stability.  He is able to reach floor height today and return to upright unsupported but continues to have more difficulty maintaining control of R vs L knee.  He has difficulty standing unsupported from 24.5 inch height today, but with improved anterior weight shift and momentum he does well.  Will work on lowered height surfaces in future sessions to improve LE motor control.  Continue per POC.  OBJECTIVE IMPAIRMENTS: Abnormal gait, cardiopulmonary status limiting activity, decreased activity tolerance, decreased balance, decreased cognition, decreased coordination, decreased endurance, decreased knowledge of condition, decreased knowledge of use of DME, decreased mobility, difficulty walking, decreased ROM, decreased strength, decreased  safety awareness, impaired perceived functional ability, increased muscle spasms, impaired sensation, impaired tone, improper body mechanics, and pain  ACTIVITY LIMITATIONS: carrying, lifting, bending, standing, squatting, stairs, transfers, reach over head, locomotion level, and caring for others  PARTICIPATION LIMITATIONS: cleaning, driving, shopping, community activity, occupation, and yard work  PERSONAL FACTORS: Behavior pattern, Education, Fitness, Past/current experiences, and 1-2 comorbidities: MS and significant cardiac involvement are also affecting patient's functional outcome.   REHAB POTENTIAL: Fair see personal factors and PMH  CLINICAL DECISION MAKING: Unstable/unpredictable  EVALUATION COMPLEXITY: High  PLAN:  PT FREQUENCY: 1-2x/week (2x/wk for initial 4 wks and 1x/wk for last 4 wks) + 1x/wk  PT DURATION: 8 weeks + 6 wks  PLANNED INTERVENTIONS: [id number]- PT Re-evaluation, 97750- Physical Performance Testing, 97110-Therapeutic exercises, 97530- Therapeutic activity, [id number]- Neuromuscular re-education, 97535- Self Care, [id number]- Manual therapy, [id number]- Gait training, [id number]- Orthotic Initial, [id number]- Orthotic/Prosthetic subsequent, [id number]- Aquatic Therapy, [phone]- Electrical stimulation (manual), Patient/Family education, Balance training, Stair training, Taping, Dry Needling, Joint mobilization, DME instructions, Wheelchair mobility training, Cryotherapy, and Moist heat  PLAN FOR NEXT SESSION:  cont. With cardio activities/exercises  Picking up from floor.  Standing from low/compliant surfaces - this has recently become difficult for him.  Standing balance. Did he schedule [facility] appt?  Cardiac focus - modified BORG response and circuit style training.   He is not interested in aquatics.  Daved KATHEE Bull, PT, DPT  03/10/2024, 11:59 AM

## 2024-03-18 ENCOUNTER — Ambulatory Visit: Admitting: Physical Therapy

## 2024-03-18 ENCOUNTER — Encounter: Payer: Self-pay | Admitting: Physical Therapy

## 2024-03-18 DIAGNOSIS — R262 Difficulty in walking, not elsewhere classified: Secondary | ICD-10-CM | POA: Diagnosis not present

## 2024-03-18 DIAGNOSIS — R29898 Other symptoms and signs involving the musculoskeletal system: Secondary | ICD-10-CM

## 2024-03-18 DIAGNOSIS — R29818 Other symptoms and signs involving the nervous system: Secondary | ICD-10-CM | POA: Diagnosis not present

## 2024-03-18 DIAGNOSIS — R2681 Unsteadiness on feet: Secondary | ICD-10-CM | POA: Diagnosis not present

## 2024-03-18 DIAGNOSIS — M6281 Muscle weakness (generalized): Secondary | ICD-10-CM

## 2024-03-18 DIAGNOSIS — M21371 Foot drop, right foot: Secondary | ICD-10-CM | POA: Diagnosis not present

## 2024-03-18 DIAGNOSIS — M25511 Pain in right shoulder: Secondary | ICD-10-CM | POA: Diagnosis not present

## 2024-03-18 DIAGNOSIS — G8929 Other chronic pain: Secondary | ICD-10-CM

## 2024-03-18 DIAGNOSIS — M21372 Foot drop, left foot: Secondary | ICD-10-CM

## 2024-03-18 DIAGNOSIS — R2689 Other abnormalities of gait and mobility: Secondary | ICD-10-CM

## 2024-03-18 NOTE — Therapy (Signed)
 OUTPATIENT PHYSICAL THERAPY NEURO TREATMENT - 20th VISIT PROGRESS NOTE   Patient Name: Johnathan Ross MRN: [id number] DOB:1970-09-19, 53 y.o., male Today's Date: 03/18/2024  PT progress note for Johnathan Ross.  Reporting period 12/09/2023 to 03/18/2024  See Note below for Objective Data and Assessment of Progress/Goals  Thank you for the referral of this patient. Johnathan Ross, PT, DPT   PCP: Johnathan Debby CROME, MD  REFERRING PROVIDER: Pegge Toribio PARAS, PA-C  END OF SESSION:  PT End of Session - 03/18/24 9061     Visit Number 20    Number of Visits 22   16+6   Date for Recertification  03/27/24   pushed out due to scheduling delay at re-cert   Authorization Type [employer]    Authorization Time Period 10-08-23 - 12-20-23; 4 more visits 12/19/23 - 01/31/24; Approved 6 PT visits 01/28/2024 - 03/27/2024    Authorization - Visit Number 18    Authorization - Number of Visits 22    Progress Note Due on Visit 20    PT Start Time 0931    PT Stop Time 1011    PT Time Calculation (min) 40 min    Equipment Utilized During Treatment Gait belt    Activity Tolerance Patient tolerated treatment well    Behavior During Therapy [employer] for tasks assessed/performed              Past Medical History:  Diagnosis Date   Hyperlipidemia    Hypertension    Hypogonadism in male 11/01/2016   Hypothyroidism    Multiple sclerosis    Neuromuscular disorder (HCC)    Proliferative diabetic retinopathy(362.02)    Type 1 diabetes mellitus (HCC) dx'd 1994   Vitamin D  insufficiency 11/29/2016   Past Surgical History:  Procedure Laterality Date   EYE SURGERY Bilateral    laser for diabetic retinopathy   FRACTURE SURGERY     ICD IMPLANT N/A 08/30/2023   Procedure: ICD IMPLANT;  Surgeon: Inocencio Soyla Lunger, MD;  Location: [facility];  Service: Cardiovascular;  Laterality: N/A;   IR VENO/EXT/UNI LEFT  03/24/2020   OPEN REDUCTION INTERNAL FIXATION (ORIF) TIBIA/FIBULA FRACTURE  Right 10/30/2016   ORIF ANKLE FRACTURE Left 2015   ORIF TIBIA FRACTURE Right 10/30/2016   Procedure: OPEN REDUCTION INTERNAL FIXATION (ORIF) TIBIA FIBULA FRACTURE;  Surgeon: Celena Sharper, MD;  Location: [facility] OR;  Service: Orthopedics;  Laterality: Right;   RETINAL LASER PROCEDURE Bilateral    for diabetic retinopathy   RIGHT/LEFT HEART CATH AND CORONARY ANGIOGRAPHY N/A 08/26/2023   Procedure: RIGHT/LEFT HEART CATH AND CORONARY ANGIOGRAPHY;  Surgeon: Gardenia Led, DO;  Location: [facility];  Service: Cardiovascular;  Laterality: N/A;   Patient Active Problem List   Diagnosis Date Noted   Mass of right lower leg 11/13/2023   Attention and concentration deficit 09/10/2023   Need for immunization against influenza 06/17/2023   Loud snoring 06/17/2023   Encounter for general adult medical examination with abnormal findings 08/02/2022   Screening for prostate cancer 08/01/2021   OAB (overactive bladder) 09/21/2020   Essential hypertension 09/21/2020   Thoracic outlet syndrome 05/03/2020   Controlled type 1 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 (HCC) 03/24/2020   Right epiretinal membrane 03/24/2020   Retinal exudates and deposits 03/24/2020   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   Nuclear sclerotic cataract of both eyes 03/24/2020   Gait disorder 03/23/2020   Benign prostatic hyperplasia without lower urinary tract symptoms 03/01/2020   Diabetic gastroparesis associated  with type 1 diabetes mellitus (HCC) 03/01/2020   Gait disturbance 08/19/2019   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/19/2019   Vitamin D  insufficiency 11/29/2016   Hypogonadism in male 11/01/2016   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HCC)    Hypothyroidism    Hypertension    Dyslipidemia, goal LDL below 130    MULTIPLE SCLEROSIS 08/29/2007   PROLIFERATIVE DIABETIC RETINOPATHY 08/29/2007    ONSET DATE: 08/26/2023  REFERRING DIAG: G93.40 (ICD-10-CM) - Acute  encephalopathy  THERAPY DIAG:  Unsteadiness on feet  Other abnormalities of gait and mobility  Muscle weakness (generalized)  Other symptoms and signs involving the nervous system  Other symptoms and signs involving the musculoskeletal system  Difficulty in walking, not elsewhere classified  Foot drop, right  Foot drop, left  Chronic right shoulder pain  Rationale for Evaluation and Treatment: Rehabilitation  SUBJECTIVE:                                                                                                                                                                                             SUBJECTIVE STATEMENT: Pt ambulates into gym today with 2WW using intermittent reciprocal pattern stating significant difficulty w/ limb advancement today - unsure why.  No knee or shoulder pain at current though he does have difficulty when he turns onto the right shoulder in bed.  No falls or acute changes.  Pt accompanied by: self  PERTINENT HISTORY: vitamin D  insufficiency, Type 1 IDDM w/ polyneuropathy, diabetic retinopathy, hypothyroidism, HTN, hypercholestrolemia, h/o Rt tibia fracture, h/o Lt ankle fracture with ORIF, MS with acquired diplegia (MS diagnosis 2014/2015)  In the week prior to his cardiac arrest he reported having some near syncopal episodes.  On 3/31 he went into witnessed cardiac arrest w/ bystander performing CPR for 20-25 minutes.  Per EMS notes he had persistent V-fib requiring 8 total defibrillations, 2 doses of epi and some ROSC w/ V tach.  He was also given Narcan in the field due to small pupils.  PAIN:  Are you having pain? No  PRECAUTIONS: Fall and ICD/Pacemaker (Can raise R shoulder as of May 9th - 6wks post placement of ICD; monitor BP - if systolic drops below 100 he is to follow-up with Cardiologist about medication management)  RED FLAGS: None   WEIGHT BEARING RESTRICTIONS: No  FALLS: Has patient fallen in last 6 months? No  LIVING  ENVIRONMENT: Lives with: lives with their partner Lives in: House/apartment Stairs: Yes: External: 5 steps; on right going up, on left going up, and can reach both Has following equipment at home: Single point cane, Walker - 2 wheeled, Wheelchair (manual),  and shower chair  PLOF: Requires assistive device for independence and Needs assistance with homemaking  PATIENT GOALS: fix my brace and if there's anything we can do for my shoulder   OBJECTIVE:  Note: Objective measures were completed at Evaluation unless otherwise noted.  DIAGNOSTIC FINDINGS:  MRI Cardiac 08/29/2023: IMPRESSION: 1. Mild to moderate decrease in left ventricular systolic function (LVEF =40%).   2. There is no late gadolinium enhancement in the left ventricular myocardium. Normal parametric mapping and tissue characterization.   3. Mild decrease in right ventricular systolic function (RVEF =45%).  CT Head 08/26/2023: IMPRESSION: 1. No evidence of acute intracranial abnormality. 2. White matter lesions better characterized on prior MRI.  CT Angio Chest PE 08/26/2023: IMPRESSION: 1. No pulmonary embolism. 2. Moderate left anterior descending coronary artery calcification. 3. Extensive dependent pulmonary consolidation throughout the lungs which may relate to aspiration or multifocal infection.  *Updated imaging ordered by Dr. Vear (MRI of brain and C-spine)   COGNITION: Overall cognitive status: Within functional limits for tasks assessed   SENSATION: Light touch: WFL   MUSCLE TONE: Increased extensor tone of LLE >RLE but ROM and coordination limited bilaterally due to extensor tone/spasticity    POSTURE: rounded shoulders, forward head, and weight shift right  LOWER EXTREMITY ROM:   Limited by extensor tone/spasticity   Active  Right Eval Left Eval  Hip flexion    Hip extension    Hip abduction    Hip adduction    Hip internal rotation    Hip external rotation    Knee flexion    Knee  extension    Ankle dorsiflexion    Ankle plantarflexion    Ankle inversion    Ankle eversion     (Blank rows = not tested)  LOWER EXTREMITY MMT:  Tested in seated position   MMT Right Eval Left Eval  Hip flexion 1 1  Hip extension    Hip abduction 4- 4-  Hip adduction    Hip internal rotation    Hip external rotation    Knee flexion 2 3-  Knee extension 3+ 1  Ankle dorsiflexion AFO donned AFO donned  Ankle plantarflexion    Ankle inversion    Ankle eversion    (Blank rows = not tested)  BED MOBILITY:  Independent per pt - still in a standard bed, needs increased time to roll L and R  TRANSFERS: Assistive device utilized: Environmental consultant - 2 wheeled  Sit to stand: SBA Stand to sit: SBA Heavy reliance on BUEs to essentially throw himself out of chair, wide BOS, better hip extension today when standing to 2WW, when he lets go he maintains anterior lean in attempt to prevent knees locking out, when knees lock out he loses his balance.   GAIT: Gait pattern: step to pattern, decreased step length- Left, decreased stride length, decreased hip/knee flexion- Right, decreased hip/knee flexion- Left, decreased ankle dorsiflexion- Right, decreased ankle dorsiflexion- Left, lateral hip instability, trunk flexed, narrow BOS, and poor foot clearance- Left Distance walked: Various clinic distances  Assistive device utilized: Walker - 2 wheeled Level of assistance: SBA Comments: Pt required 9 minutes to ambulate into treatment room (~30') , frequently stopping to rest as his legs lock up. Noted excessive inversion of L ankle as well as scissoring of LLE. Pt relying heavily on BUEs to perform more of a swing-to pattern.   FUNCTIONAL TESTS:  TREATMENT: 03/18/24  NMR: -20 STS BUE supported x10 > RUE supported x7 (last 4 reps using low chair seat for increased  forward lean to create improved lever arm of movement), does not use momentum to power up today but has more difficulty slowing sit mechanics, SBA-CGA, has one instance of moderate posterior LOB when sitting uncontrolled -Arm bike level 5.0 alternating x2 minutes forwards then backwards (repeated for total of 8 minutes), cues to improve periscapular and core engagement  PATIENT EDUCATION: Education details:  Continue HEP.   Discussed possible OA changes in R shoulder and following up with MD.  Discussed possible impacts of cold weather on LE/general movement. Person educated: Patient Education method: Explanation, Demonstration, Tactile cues, Verbal cues, and Handouts Education comprehension: verbalized understanding, returned demonstration, verbal cues required, tactile cues required, and needs further education  HOME EXERCISE PROGRAM:  Access Code: [email or web address] URL: [email or web address] Date: 10/28/2023 Prepared by: Johnathan Ross  Exercises - Supine Thoracic Mobilization Towel Roll Vertical with Arm Stretch  - 1 x daily - 5 x weekly - 1 sets - 2 minutes hold - Supine Lower Trunk Rotation  - 1 x daily - 5 x weekly - 2 sets - 10 reps - Seated Gastroc Stretch with Strap  - 1 x daily - 7 x weekly - 2 sets - 60 second hold - Standing Gastroc Stretch at Counter  - 2 x daily - 7 x weekly - 1 sets - 3 reps - 30 sec hold - Heel Sits  - 1 x daily - 5 x weekly - 1 sets - 12 reps - Tall Kneeling Diagonal Lift with Medicine Ball  - 1 x daily - 5 x weekly - 1 sets - 5-10 reps - Marching Bridge  - 1 x daily - 5 x weekly - 1 sets - 6 reps - Clamshell  - 1 x daily - 5 x weekly - 1-2 sets - 10 reps - Long Sitting Hamstring Stretch  - 1 x daily - 5 x weekly - 1 sets - 2-3 reps - 30-45 seconds hold - Seated Knee Flexion  - 1 x daily - 5 x weekly - 1-2 sets - 4 reps - Seated Hip Abduction with Resistance  - 1 x daily - 5 x weekly - 2 sets - 10 reps - Seated Scapular Retraction  - 1 x daily -  7 x weekly - 3 sets - 10 reps - Single Arm Doorway Pec Stretch at 90 Degrees Abduction  - 1 x daily - 7 x weekly - 1 sets - 3-5 reps - 30 sec each hold - Open Book Chest Stretch on Towel Roll  - 1 x daily - 7 x weekly - 1 sets - 10 reps - Supine Shoulder External Rotation Stretch  - 1 x daily - 7 x weekly - 1-2 sets - 10 reps - Supine Shoulder Internal Rotation Stretch  - 1 x daily - 7 x weekly - 1-2 sets - 10 reps  HEP updated:  for shoulder strengthening and stretching Access Code: [email or web address] URL: [email or web address] Date: 11/06/2023 Prepared by: Rock Kussmaul  Exercises - Standing Bilateral Shoulder Internal Rotation AAROM with Dowel  - 1 x daily - 7 x weekly - 3 sets - 10 reps - Standing Shoulder Extension with Dowel  - 1 x daily - 7 x weekly - 3 sets - 10 reps - Standing Overhead Shoulder External Rotation Stretch with Towel  - 1 x daily - 7 x weekly - 3 sets - 10 reps -  Corner Pec Major Stretch  - 1 x daily - 7 x weekly - 1 sets - 2-3 reps - 15-20 hold - Doorway Pec Stretch at 120 Degrees Abduction  - 1 x daily - 7 x weekly - 1 sets - 2-3 reps - 15 hold  GOALS: Goals reviewed with patient? Yes  SHORT TERM GOALS: Target date: 11/08/2023  PT will review, condense and modify HEP to meet current functional level. Baseline: to be reviewed from previous POC Goal status: Goal met 11-07-23   Patient will obtain cooling vest from [employer] to improve ability to continue activity in warmer months. Baseline: PT will fax request to [employer] if pt unable to obtain on his own.  Pt has not followed up and we will do this next visit (6/16) Goal status: IN PROGRESS    [employer] rep and PT to further assess and adjust left AFO if able to improve seating into brace for improved utilization. Baseline: PT contacted [facility] at [facility] and followed up, pt wants Orthotist to come to clinic so will likely be later appt (6/16) Goal status: IN PROGRESS   LONG TERM GOALS: Target date:  12/06/2023   Patient will tolerate low to moderate level cardio focused exercise for >/=10 minutes w/ RPE of no more than 5/10 in order to demonstrate tolerance to steady state activity. Baseline: 10 minutes RPE 0/10 (7/24) Goal status: MET  Patient will verbalize strategies for energy conservation to improve compliance to safety recommendations. Baseline: Reviewed with teachback (7/24) Goal status: MET  Patient will report no right shoulder pain with light activity in order to improve functional use of UE. Baseline: 1-2/10 w/ repetition; continues to have IR discomfort with movement (7/24) Goal status: NOT MET  4.  Patient will demonstrate unsupported standing x30 seconds without LOB in order to improve upright safety and functional participation in ADLs. Baseline:  BLE lockout w/ LOB; 6 seconds w/ flexed posture (7/24)  Goal status:  IN PROGRESS  5.  Patient will demonstrate reach to floor from standing at no more than supervision level to improve functional upright mobility and independence. Baseline:  Cannot reach to floor without BLE collapsing; minA or BUE assistance for balance (7/24)  Goal status:  NOT MET  GOALS (at 2/75 re-cert): Goals reviewed with patient? Yes  SHORT TERM GOALS = LONG TERM GOALS: Target date: 01/17/2024  Patient will report no right shoulder pain with light activity in order to improve functional use of UE. Baseline: 1-2/10 w/ repetition; continues to have IR discomfort with movement (7/24); 1/10 w/ light shoulder rolls and other typical elevation patterns (9/2) Goal status: NOT MET  2.  Patient will demonstrate unsupported standing x30 seconds without LOB in order to improve upright safety and functional participation in ADLs. Baseline:  BLE lockout w/ LOB; 6 seconds w/ flexed posture (7/24); 30 sec RLE hyperextended and trunk mildly flexed w/ L weight shift (9/2)  Goal status:  MET  3.  Patient will demonstrate reach to floor using no more than single  UE support from standing at no more than supervision level to improve functional upright mobility and independence. Baseline:  Cannot reach to floor without BLE collapsing; minA or BUE assistance for balance (7/24); uses RUE on thigh for return to upright SBA (9/2)  Goal status:  MET  GOALS (at 9/2 re-cert): Goals reviewed with patient? Yes  SHORT TERM GOALS: Target date: 02/21/2024  Patient will report no falls in recent 3 weeks to demonstrate improved R knee stability.  Baseline: 2 in recent week from R knee giving out (9/2); none since Sept (10/22) Goal status: MET  LONG TERM GOALS: Target date: 03/13/2024  Patient will report scheduled follow-ups with PCP and ortho MD regarding hitting head and R knee instability for improved safety and medical management. Baseline: Pt canceled ortho follow-up prior (9/2) Goal status: INITIAL  2.  Patient will demonstrate unsupported standing x60 seconds without LOB and improved symmetry of weight bearing in order to improve upright safety and functional participation in ADLs. Baseline:  BLE lockout w/ LOB; 6 seconds w/ flexed posture (7/24); 30 sec RLE hyperextended and trunk mildly flexed w/ L weight shift (9/2)  Goal status:  REVISED  3.  Patient will return to upright from floor reach without UE support to demonstrate improved BLE stability and righting reactions. Baseline:  Cannot reach to floor without BLE collapsing; minA or BUE assistance for balance (7/24); uses RUE on thigh for return to upright SBA (9/2)  Goal status:  REVISED  ASSESSMENT:  CLINICAL IMPRESSION: Pt challenged by worsened vaulting today due to increased extensor tone.  He continues wearing AFOs intermittently, but prefers not to.  He has had no further falls since one concerning for knee injury which he is still planning to follow-up with MD if symptoms recur (they have improved for the most part).  Focus of skilled PT session today on further addressing eccentric control w/  functional transfers from lowered surfaces to meet pt personal as well as objective goals.  He does very well with compensatory technique creating increased forward lean to slow sitting.  PT to further work on low surfaces and motor control to optimize safe independence.  Continue per POC.  OBJECTIVE IMPAIRMENTS: Abnormal gait, cardiopulmonary status limiting activity, decreased activity tolerance, decreased balance, decreased cognition, decreased coordination, decreased endurance, decreased knowledge of condition, decreased knowledge of use of DME, decreased mobility, difficulty walking, decreased ROM, decreased strength, decreased safety awareness, impaired perceived functional ability, increased muscle spasms, impaired sensation, impaired tone, improper body mechanics, and pain  ACTIVITY LIMITATIONS: carrying, lifting, bending, standing, squatting, stairs, transfers, reach over head, locomotion level, and caring for others  PARTICIPATION LIMITATIONS: cleaning, driving, shopping, community activity, occupation, and yard work  PERSONAL FACTORS: Behavior pattern, Education, Fitness, Past/current experiences, and 1-2 comorbidities: MS and significant cardiac involvement are also affecting patient's functional outcome.   REHAB POTENTIAL: Fair see personal factors and PMH  CLINICAL DECISION MAKING: Unstable/unpredictable  EVALUATION COMPLEXITY: High  PLAN:  PT FREQUENCY: 1-2x/week (2x/wk for initial 4 wks and 1x/wk for last 4 wks) + 1x/wk  PT DURATION: 8 weeks + 6 wks  PLANNED INTERVENTIONS: [id number]- PT Re-evaluation, 97750- Physical Performance Testing, 97110-Therapeutic exercises, 97530- Therapeutic activity, [id number]- Neuromuscular re-education, 97535- Self Care, [id number]- Manual therapy, [id number]- Gait training, [phone]- Orthotic Initial, [id number]- Orthotic/Prosthetic subsequent, [id number]- Aquatic Therapy, [phone]- Electrical stimulation (manual), Patient/Family education, Balance training, Stair training, Taping,  Dry Needling, Joint mobilization, DME instructions, Wheelchair mobility training, Cryotherapy, and Moist heat  PLAN FOR NEXT SESSION:   ASSESS LTGs - D/C?  cont. With cardio activities/exercises  Picking up from floor.  Standing from low/compliant surfaces - this has recently become difficult for him.  Standing balance. Did he schedule [employer] appt?  Cardiac focus - modified BORG response and circuit style training.   He is not interested in aquatics.  Johnathan KATHEE Ross, PT, DPT  03/18/2024, 10:11 AM

## 2024-03-23 ENCOUNTER — Ambulatory Visit: Payer: Self-pay | Admitting: Physical Therapy

## 2024-03-23 ENCOUNTER — Encounter: Payer: Self-pay | Admitting: Physical Therapy

## 2024-03-23 DIAGNOSIS — M21372 Foot drop, left foot: Secondary | ICD-10-CM | POA: Diagnosis not present

## 2024-03-23 DIAGNOSIS — R2689 Other abnormalities of gait and mobility: Secondary | ICD-10-CM | POA: Diagnosis not present

## 2024-03-23 DIAGNOSIS — R262 Difficulty in walking, not elsewhere classified: Secondary | ICD-10-CM | POA: Diagnosis not present

## 2024-03-23 DIAGNOSIS — R29818 Other symptoms and signs involving the nervous system: Secondary | ICD-10-CM

## 2024-03-23 DIAGNOSIS — R29898 Other symptoms and signs involving the musculoskeletal system: Secondary | ICD-10-CM

## 2024-03-23 DIAGNOSIS — M6281 Muscle weakness (generalized): Secondary | ICD-10-CM | POA: Diagnosis not present

## 2024-03-23 DIAGNOSIS — M25511 Pain in right shoulder: Secondary | ICD-10-CM | POA: Diagnosis not present

## 2024-03-23 DIAGNOSIS — M21371 Foot drop, right foot: Secondary | ICD-10-CM | POA: Diagnosis not present

## 2024-03-23 DIAGNOSIS — R2681 Unsteadiness on feet: Secondary | ICD-10-CM

## 2024-03-23 DIAGNOSIS — G8929 Other chronic pain: Secondary | ICD-10-CM

## 2024-03-23 NOTE — Therapy (Signed)
 OUTPATIENT PHYSICAL THERAPY NEURO TREATMENT - DISCHARGE SUMMARY   Patient Name: Johnathan Ross MRN: [id number] DOB:Sep 08, 1970, 53 y.o., male Today's Date: 03/23/2024  PHYSICAL THERAPY DISCHARGE SUMMARY  Visits from Start of Care: 21  Current functional level related to goals / functional outcomes: See clinical impression statement.   Remaining deficits: Ongoing bilateral foot drop w/ AFOs, reliance on walker for intermittent paralytic gait, ongoing edu related to diagnosis and deficits.   Education / Equipment: Continue HEP.   Discussed following up with MD if ever hitting head w/ falls and ortho MD as needed for shoulder and knee pain.  Plan for discharge today and progress towards goals.  Ways to safely work on floor reach and unsupported standing at home.  Episodic care - may benefit from 2x a year to check in w/ PT and measure functional goals (likely return around [facility] February 2026 unless anything changes prior to this time).   Patient agrees to discharge. Patient goals were partially met. Patient is being discharged due to maximized rehab potential.   PCP: [name], MD  REFERRING PROVIDER: Pegge Toribio PARAS, PA-C  END OF SESSION:  PT End of Session - 03/23/24 0930     Visit Number 21    Number of Visits 22   16+6   Date for Recertification  03/27/24   pushed out due to scheduling delay at re-cert   Authorization Type [employer]    Authorization Time Period 10-08-23 - 12-20-23; 4 more visits 12/19/23 - 01/31/24; Approved 6 PT visits 01/28/2024 - 03/27/2024    Authorization - Visit Number 19    Authorization - Number of Visits 22    Progress Note Due on Visit 20    PT Start Time 0923    PT Stop Time 1008    PT Time Calculation (min) 45 min    Equipment Utilized During Treatment Gait belt    Activity Tolerance Patient tolerated treatment well    Behavior During Therapy [facility] for tasks assessed/performed              Past Medical History:  Diagnosis  Date   Hyperlipidemia    Hypertension    Hypogonadism in male 11/01/2016   Hypothyroidism    Multiple sclerosis    Neuromuscular disorder (HCC)    Proliferative diabetic retinopathy(362.02)    Type 1 diabetes mellitus (HCC) dx'd 1994   Vitamin D  insufficiency 11/29/2016   Past Surgical History:  Procedure Laterality Date   EYE SURGERY Bilateral    laser for diabetic retinopathy   FRACTURE SURGERY     ICD IMPLANT N/A 08/30/2023   Procedure: ICD IMPLANT;  Surgeon: Inocencio Soyla Lunger, MD;  Location: [facility] INVASIVE CV LAB;  Service: Cardiovascular;  Laterality: N/A;   IR VENO/EXT/UNI LEFT  03/24/2020   OPEN REDUCTION INTERNAL FIXATION (ORIF) TIBIA/FIBULA FRACTURE Right 10/30/2016   ORIF ANKLE FRACTURE Left 2015   ORIF TIBIA FRACTURE Right 10/30/2016   Procedure: OPEN REDUCTION INTERNAL FIXATION (ORIF) TIBIA FIBULA FRACTURE;  Surgeon: Celena Sharper, MD;  Location: [facility] OR;  Service: Orthopedics;  Laterality: Right;   RETINAL LASER PROCEDURE Bilateral    for diabetic retinopathy   RIGHT/LEFT HEART CATH AND CORONARY ANGIOGRAPHY N/A 08/26/2023   Procedure: RIGHT/LEFT HEART CATH AND CORONARY ANGIOGRAPHY;  Surgeon: Gardenia Led, DO;  Location: [facility] INVASIVE CV LAB;  Service: Cardiovascular;  Laterality: N/A;   Patient Active Problem List   Diagnosis Date Noted   Mass of right lower leg 11/13/2023   Attention and concentration deficit  09/10/2023   Need for immunization against influenza 06/17/2023   Loud snoring 06/17/2023   Encounter for general adult medical examination with abnormal findings 08/02/2022   Screening for prostate cancer 08/01/2021   OAB (overactive bladder) 09/21/2020   Essential hypertension 09/21/2020   Thoracic outlet syndrome 05/03/2020   Controlled type 1 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 (HCC) 03/24/2020   Right epiretinal membrane 03/24/2020   Retinal exudates and deposits 03/24/2020   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   Nuclear sclerotic cataract of both eyes 03/24/2020   Gait disorder 03/23/2020   Benign prostatic hyperplasia without lower urinary tract symptoms 03/01/2020   Diabetic gastroparesis associated with type 1 diabetes mellitus (HCC) 03/01/2020   Gait disturbance 08/19/2019   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/19/2019   Vitamin D  insufficiency 11/29/2016   Hypogonadism in male 11/01/2016   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HCC)    Hypothyroidism    Hypertension    Dyslipidemia, goal LDL below 130    MULTIPLE SCLEROSIS 08/29/2007   PROLIFERATIVE DIABETIC RETINOPATHY 08/29/2007    ONSET DATE: 08/26/2023  REFERRING DIAG: G93.40 (ICD-10-CM) - Acute encephalopathy  THERAPY DIAG:  Unsteadiness on feet  Other abnormalities of gait and mobility  Muscle weakness (generalized)  Other symptoms and signs involving the nervous system  Other symptoms and signs involving the musculoskeletal system  Difficulty in walking, not elsewhere classified  Foot drop, right  Foot drop, left  Chronic right shoulder pain  Rationale for Evaluation and Treatment: Rehabilitation  SUBJECTIVE:                                                                                                                                                                                             SUBJECTIVE STATEMENT: Pt ambulates into gym today with 2WW using intermittent reciprocal pattern.  He presents w/ significant other today.  He denies falls or pain.  He is wearing bilateral AFOs today. Pt accompanied by: self  PERTINENT HISTORY: vitamin D  insufficiency, Type 1 IDDM w/ polyneuropathy, diabetic retinopathy, hypothyroidism, HTN, hypercholestrolemia, h/o Rt tibia fracture, h/o Lt ankle fracture with ORIF, MS with acquired diplegia (MS diagnosis 2014/2015)  In the week prior to his cardiac arrest he reported having some near syncopal episodes.  On 3/31 he went into witnessed cardiac arrest w/  bystander performing CPR for 20-25 minutes.  Per EMS notes he had persistent V-fib requiring 8 total defibrillations, 2 doses of epi and some ROSC w/ V tach.  He was also given Narcan in the field due to small pupils.  PAIN:  Are you having pain? No  PRECAUTIONS: Fall  and ICD/Pacemaker (Can raise R shoulder as of May 9th - 6wks post placement of ICD; monitor BP - if systolic drops below 100 he is to follow-up with Cardiologist about medication management)  RED FLAGS: None   WEIGHT BEARING RESTRICTIONS: No  FALLS: Has patient fallen in last 6 months? No  LIVING ENVIRONMENT: Lives with: lives with their partner Lives in: House/apartment Stairs: Yes: External: 5 steps; on right going up, on left going up, and can reach both Has following equipment at home: Single point cane, Walker - 2 wheeled, Wheelchair (manual), and shower chair  PLOF: Requires assistive device for independence and Needs assistance with homemaking  PATIENT GOALS: fix my brace and if there's anything we can do for my shoulder   OBJECTIVE:  Note: Objective measures were completed at Evaluation unless otherwise noted.  DIAGNOSTIC FINDINGS:  MRI Cardiac 08/29/2023: IMPRESSION: 1. Mild to moderate decrease in left ventricular systolic function (LVEF =40%).   2. There is no late gadolinium enhancement in the left ventricular myocardium. Normal parametric mapping and tissue characterization.   3. Mild decrease in right ventricular systolic function (RVEF =45%).  CT Head 08/26/2023: IMPRESSION: 1. No evidence of acute intracranial abnormality. 2. White matter lesions better characterized on prior MRI.  CT Angio Chest PE 08/26/2023: IMPRESSION: 1. No pulmonary embolism. 2. Moderate left anterior descending coronary artery calcification. 3. Extensive dependent pulmonary consolidation throughout the lungs which may relate to aspiration or multifocal infection.  *Updated imaging ordered by Dr. Vear (MRI of brain  and C-spine)   COGNITION: Overall cognitive status: Within functional limits for tasks assessed   SENSATION: Light touch: WFL   MUSCLE TONE: Increased extensor tone of LLE >RLE but ROM and coordination limited bilaterally due to extensor tone/spasticity    POSTURE: rounded shoulders, forward head, and weight shift right  LOWER EXTREMITY ROM:   Limited by extensor tone/spasticity   Active  Right Eval Left Eval  Hip flexion    Hip extension    Hip abduction    Hip adduction    Hip internal rotation    Hip external rotation    Knee flexion    Knee extension    Ankle dorsiflexion    Ankle plantarflexion    Ankle inversion    Ankle eversion     (Blank rows = not tested)  LOWER EXTREMITY MMT:  Tested in seated position   MMT Right Eval Left Eval  Hip flexion 1 1  Hip extension    Hip abduction 4- 4-  Hip adduction    Hip internal rotation    Hip external rotation    Knee flexion 2 3-  Knee extension 3+ 1  Ankle dorsiflexion AFO donned AFO donned  Ankle plantarflexion    Ankle inversion    Ankle eversion    (Blank rows = not tested)  BED MOBILITY:  Independent per pt - still in a standard bed, needs increased time to roll L and R  TRANSFERS: Assistive device utilized: Environmental Consultant - 2 wheeled  Sit to stand: SBA Stand to sit: SBA Heavy reliance on BUEs to essentially throw himself out of chair, wide BOS, better hip extension today when standing to 2WW, when he lets go he maintains anterior lean in attempt to prevent knees locking out, when knees lock out he loses his balance.   GAIT: Gait pattern: step to pattern, decreased step length- Left, decreased stride length, decreased hip/knee flexion- Right, decreased hip/knee flexion- Left, decreased ankle dorsiflexion- Right, decreased ankle dorsiflexion- Left, lateral  hip instability, trunk flexed, narrow BOS, and poor foot clearance- Left Distance walked: Various clinic distances  Assistive device utilized: Walker -  2 wheeled Level of assistance: SBA Comments: Pt required 9 minutes to ambulate into treatment room (~30') , frequently stopping to rest as his legs lock up. Noted excessive inversion of L ankle as well as scissoring of LLE. Pt relying heavily on BUEs to perform more of a swing-to pattern.   FUNCTIONAL TESTS:                                                                                                                                 TREATMENT: 03/23/24  TA: -x8 attempts at unsupported standing up to 60 seconds, PT providing multimodal cues to improve posture away from mat support.  Maintains forward lean throughout reps w/ hands near knees in crouched stance - difficulty obtaining upright -x14 attempts to obtain pen from convenient position on floor from standing w/ LUE on left thigh -Arm bike on level 6.0 x4 minutes forwards > x4 minutes backwards for postural feedback and engagement as well as cardiovascular endurance.  PATIENT EDUCATION: Education details:  Continue HEP.   Discussed following up with MD if ever hitting head w/ falls and ortho MD as needed for shoulder and knee pain.  Plan for discharge today and progress towards goals.  Ways to safely work on floor reach and unsupported standing at home.  Episodic care - may benefit from 2x a year to check in w/ PT and measure functional goals (likely return around [facility] February 2026 unless anything changes prior to this time). Person educated: Patient Education method: Explanation, Demonstration, Tactile cues, Verbal cues, and Handouts Education comprehension: verbalized understanding, returned demonstration, verbal cues required, tactile cues required, and needs further education  HOME EXERCISE PROGRAM:  Access Code: [email or web address] URL: [email or web address] Date: 10/28/2023 Prepared by: Daved Bull  Exercises - Supine Thoracic Mobilization Towel Roll Vertical with Arm Stretch  - 1 x daily - 5 x weekly - 1 sets - 2  minutes hold - Supine Lower Trunk Rotation  - 1 x daily - 5 x weekly - 2 sets - 10 reps - Seated Gastroc Stretch with Strap  - 1 x daily - 7 x weekly - 2 sets - 60 second hold - Standing Gastroc Stretch at Counter  - 2 x daily - 7 x weekly - 1 sets - 3 reps - 30 sec hold - Heel Sits  - 1 x daily - 5 x weekly - 1 sets - 12 reps - Tall Kneeling Diagonal Lift with Medicine Ball  - 1 x daily - 5 x weekly - 1 sets - 5-10 reps - Marching Bridge  - 1 x daily - 5 x weekly - 1 sets - 6 reps - Clamshell  - 1 x daily - 5 x weekly - 1-2 sets - 10 reps - Long Sitting Hamstring Stretch  - 1 x daily - 5  x weekly - 1 sets - 2-3 reps - 30-45 seconds hold - Seated Knee Flexion  - 1 x daily - 5 x weekly - 1-2 sets - 4 reps - Seated Hip Abduction with Resistance  - 1 x daily - 5 x weekly - 2 sets - 10 reps - Seated Scapular Retraction  - 1 x daily - 7 x weekly - 3 sets - 10 reps - Single Arm Doorway Pec Stretch at 90 Degrees Abduction  - 1 x daily - 7 x weekly - 1 sets - 3-5 reps - 30 sec each hold - Open Book Chest Stretch on Towel Roll  - 1 x daily - 7 x weekly - 1 sets - 10 reps - Supine Shoulder External Rotation Stretch  - 1 x daily - 7 x weekly - 1-2 sets - 10 reps - Supine Shoulder Internal Rotation Stretch  - 1 x daily - 7 x weekly - 1-2 sets - 10 reps  HEP updated:  for shoulder strengthening and stretching Access Code: [email or web address] URL: [email or web address] Date: 11/06/2023 Prepared by: Rock Kussmaul  Exercises - Standing Bilateral Shoulder Internal Rotation AAROM with Dowel  - 1 x daily - 7 x weekly - 3 sets - 10 reps - Standing Shoulder Extension with Dowel  - 1 x daily - 7 x weekly - 3 sets - 10 reps - Standing Overhead Shoulder External Rotation Stretch with Towel  - 1 x daily - 7 x weekly - 3 sets - 10 reps - Corner Pec Major Stretch  - 1 x daily - 7 x weekly - 1 sets - 2-3 reps - 15-20 hold - Doorway Pec Stretch at 120 Degrees Abduction  - 1 x daily - 7 x weekly - 1 sets - 2-3 reps  - 15 hold  GOALS: Goals reviewed with patient? Yes  SHORT TERM GOALS: Target date: 11/08/2023  PT will review, condense and modify HEP to meet current functional level. Baseline: to be reviewed from previous POC Goal status: Goal met 11-07-23   Patient will obtain cooling vest from [employer] to improve ability to continue activity in warmer months. Baseline: PT will fax request to [employer] if pt unable to obtain on his own.  Pt has not followed up and we will do this next visit (6/16) Goal status: IN PROGRESS    Hanger rep and PT to further assess and adjust left AFO if able to improve seating into brace for improved utilization. Baseline: PT contacted Olivia at [facility] and followed up, pt wants Orthotist to come to clinic so will likely be later appt (6/16) Goal status: IN PROGRESS   LONG TERM GOALS: Target date: 12/06/2023   Patient will tolerate low to moderate level cardio focused exercise for >/=10 minutes w/ RPE of no more than 5/10 in order to demonstrate tolerance to steady state activity. Baseline: 10 minutes RPE 0/10 (7/24) Goal status: MET  Patient will verbalize strategies for energy conservation to improve compliance to safety recommendations. Baseline: Reviewed with teachback (7/24) Goal status: MET  Patient will report no right shoulder pain with light activity in order to improve functional use of UE. Baseline: 1-2/10 w/ repetition; continues to have IR discomfort with movement (7/24) Goal status: NOT MET  4.  Patient will demonstrate unsupported standing x30 seconds without LOB in order to improve upright safety and functional participation in ADLs. Baseline:  BLE lockout w/ LOB; 6 seconds w/ flexed posture (7/24)  Goal status:  IN PROGRESS  5.  Patient  will demonstrate reach to floor from standing at no more than supervision level to improve functional upright mobility and independence. Baseline:  Cannot reach to floor without BLE collapsing; minA or BUE  assistance for balance (7/24)  Goal status:  NOT MET  GOALS (at 2/75 re-cert): Goals reviewed with patient? Yes  SHORT TERM GOALS = LONG TERM GOALS: Target date: 01/17/2024  Patient will report no right shoulder pain with light activity in order to improve functional use of UE. Baseline: 1-2/10 w/ repetition; continues to have IR discomfort with movement (7/24); 1/10 w/ light shoulder rolls and other typical elevation patterns (9/2) Goal status: NOT MET  2.  Patient will demonstrate unsupported standing x30 seconds without LOB in order to improve upright safety and functional participation in ADLs. Baseline:  BLE lockout w/ LOB; 6 seconds w/ flexed posture (7/24); 30 sec RLE hyperextended and trunk mildly flexed w/ L weight shift (9/2)  Goal status:  MET  3.  Patient will demonstrate reach to floor using no more than single UE support from standing at no more than supervision level to improve functional upright mobility and independence. Baseline:  Cannot reach to floor without BLE collapsing; minA or BUE assistance for balance (7/24); uses RUE on thigh for return to upright SBA (9/2)  Goal status:  MET  GOALS (at 9/2 re-cert): Goals reviewed with patient? Yes  SHORT TERM GOALS: Target date: 02/21/2024  Patient will report no falls in recent 3 weeks to demonstrate improved R knee stability. Baseline: 2 in recent week from R knee giving out (9/2); none since Sept (10/22) Goal status: MET  LONG TERM GOALS: Target date: 03/13/2024  Patient will report scheduled follow-ups with PCP and ortho MD regarding hitting head and R knee instability for improved safety and medical management. Baseline: Pt canceled ortho follow-up prior (9/2); pt has not scheduled follow-ups (10/27) Goal status: NOT MET  2.  Patient will demonstrate unsupported standing x60 seconds without LOB and improved symmetry of weight bearing in order to improve upright safety and functional participation in ADLs. Baseline:   BLE lockout w/ LOB; 6 seconds w/ flexed posture (7/24); 30 sec RLE hyperextended and trunk mildly flexed w/ L weight shift (9/2); 60 sec w/ forward lean and hands near knees (10/27)  Goal status:  PARTIALLY MET  3.  Patient will return to upright from floor reach without UE support to demonstrate improved BLE stability and righting reactions. Baseline:  Cannot reach to floor without BLE collapsing; minA or BUE assistance for balance (7/24); uses RUE on thigh for return to upright SBA (9/2); obtains pen from convenient position on floor SBA w/ LUE on left thigh (10/27)  Goal status:  PARTIALLY MET  ASSESSMENT:  CLINICAL IMPRESSION: Pt seen for discharge visit today where goals are partially met.  His static stance unsupported has progressed in time prior to LOB, but his posture remains challenged as his LE control is impacted by erect spine.  He is able to reach a pen on the floor if it is conveniently placed.  Time spent discussing ways to continue working towards personal and objective goals at home and return for episodic care based on diagnosis and likely fluctuating PT needs.  He is in agreement to discharge at this time.  OBJECTIVE IMPAIRMENTS: Abnormal gait, cardiopulmonary status limiting activity, decreased activity tolerance, decreased balance, decreased cognition, decreased coordination, decreased endurance, decreased knowledge of condition, decreased knowledge of use of DME, decreased mobility, difficulty walking, decreased ROM, decreased strength, decreased safety awareness,  impaired perceived functional ability, increased muscle spasms, impaired sensation, impaired tone, improper body mechanics, and pain  ACTIVITY LIMITATIONS: carrying, lifting, bending, standing, squatting, stairs, transfers, reach over head, locomotion level, and caring for others  PARTICIPATION LIMITATIONS: cleaning, driving, shopping, community activity, occupation, and yard work  PERSONAL FACTORS: Behavior  pattern, Education, Fitness, Past/current experiences, and 1-2 comorbidities: MS and significant cardiac involvement are also affecting patient's functional outcome.   REHAB POTENTIAL: Fair see personal factors and PMH  CLINICAL DECISION MAKING: Unstable/unpredictable  EVALUATION COMPLEXITY: High  PLAN:  PT FREQUENCY: 1-2x/week (2x/wk for initial 4 wks and 1x/wk for last 4 wks) + 1x/wk  PT DURATION: 8 weeks + 6 wks  PLANNED INTERVENTIONS: [id number]- PT Re-evaluation, 97750- Physical Performance Testing, 97110-Therapeutic exercises, 97530- Therapeutic activity, [id number]- Neuromuscular re-education, 97535- Self Care, [id number]- Manual therapy, [id number]- Gait training, [phone]- Orthotic Initial, [phone]- Orthotic/Prosthetic subsequent, [phone]- Aquatic Therapy, [phone]- Electrical stimulation (manual), Patient/Family education, Balance training, Stair training, Taping, Dry Needling, Joint mobilization, DME instructions, Wheelchair mobility training, Cryotherapy, and Moist heat  PLAN FOR NEXT SESSION:   N/A  Daved KATHEE Bull, PT, DPT  03/23/2024, 10:09 AM

## 2024-04-02 ENCOUNTER — Other Ambulatory Visit (HOSPITAL_COMMUNITY): Payer: Self-pay | Admitting: Family Medicine

## 2024-04-02 DIAGNOSIS — E782 Mixed hyperlipidemia: Secondary | ICD-10-CM

## 2024-04-03 NOTE — Progress Notes (Addendum)
 Electrophysiology Office Note:   Date:  04/14/2024  ID:  Johnathan Ross, DOB 11/28/1970, MRN [id number]  Primary Cardiologist: Ria Commander, DO Primary Heart Failure: None Electrophysiologist: Will Gladis Norton, MD       History of Present Illness:   Johnathan Ross is a 53 y.o. male with h/o  VT/VF OOH cardiac arrest s/p ICD,NICM, no LGE on cMRI, HTN, HLD, relapsing-remitting MS on [place]  (walks with a walker at baseline), hypothyroidism, DM I seen today for routine electrophysiology followup.   Since last being seen in our clinic the patient reports doing well.  His sister accompanies him to today's visit.  He reports he finished cardiac rehab and is doing PT for strength & conditioning. No known arrhythmias.  He has been on amiodarone  100mg  since dose reduced in September.  No issues with medication.  He notes he has gained ~8lbs and is unable to pinpoint a reason for the weight gain.   He denies chest pain, palpitations, dyspnea, PND, orthopnea, nausea, vomiting, dizziness, syncope, edema, weight gain, or early satiety.   Review of systems complete and found to be negative unless listed in HPI.   EP Information / Studies Reviewed:    EKG is ordered today. Personal review as below.  EKG Interpretation Date/Time:  Tuesday April 14 2024 10:32:36 EST Ventricular Rate:  61 PR Interval:  152 QRS Duration:  74 QT Interval:  384 QTC Calculation: 386 R Axis:   12  Text Interpretation: Normal sinus rhythm Confirmed by Aniceto Jarvis ([id number]) on 04/14/2024 10:36:48 AM   ICD Interrogation-  limited device check to review for NSVT / VT, see below   Device History: Abbott Single Chamber ICD implanted 08/30/23 for VT/VF Arrest History of appropriate therapy: No History of AAD therapy: Yes; currently on amiodarone     Arrhythmia / AAD / Pertinent EP Studies VT / VF 08/26/23 > s/p OOH arrest  Amiodarone  initiated 07/2023 >  ECHO 08/26/23 > LVEF 30-35%  R/LHC 08/26/23 > mild  non-obs CAD, severely elevated pre-capillary filling pressures, hemodynamics consistent with sub-optimal RV function cMRI 08/28/23 > no LGE   Risk Assessment/Calculations:              Physical Exam:   VS:  BP 126/88 (BP Location: Right Arm, Patient Position: Sitting, Cuff Size: Normal)   Pulse 62   Ht 6' 1 (1.854 m)   Wt 217 lb 3.2 oz (98.5 kg)   SpO2 98%   BMI 28.66 kg/m    Wt Readings from Last 3 Encounters:  04/14/24 217 lb 3.2 oz (98.5 kg)  01/24/24 210 lb (95.3 kg)  01/08/24 216 lb (98 kg)     GEN: Well nourished, well developed in no acute distress NECK: No JVD; No carotid bruits CARDIAC: Regular rate and rhythm, no murmurs, rubs, gallops. 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 Discussed risk of recurrent  rhythm with pt/family and ICD role.  -shock plan reviewed  -driving restrictions up 89/8/74  -amiodarone  labs > TSH/T4 in 12/2023 stable   -will repeat TSH / LFT's with weight gain (does not appear to be fluid) -EKG with NSR, stable intervals on amiodarone   -pt and sister request to transfer to Dr. Zenaida if possible (will message HF NP)  Limited Device Check:  P: VS B: 91.-9.9 years  No arrhythmia episodes  CorVue euvolemic  MS  -per primary   Disposition:   Follow up with Dr. Inocencio in 12 months  or sooner if new needs arise.    Signed, Daphne Barrack, NP-C, AGACNP-BC [facility] - Electrophysiology  04/14/2024, 10:37 AM

## 2024-04-14 ENCOUNTER — Ambulatory Visit: Attending: Pulmonary Disease | Admitting: Pulmonary Disease

## 2024-04-14 ENCOUNTER — Encounter: Payer: Self-pay | Admitting: Pulmonary Disease

## 2024-04-14 VITALS — BP 126/88 | HR 62 | Ht 73.0 in | Wt 217.2 lb

## 2024-04-14 DIAGNOSIS — Z95810 Presence of automatic (implantable) cardiac defibrillator: Secondary | ICD-10-CM

## 2024-04-14 DIAGNOSIS — I472 Ventricular tachycardia, unspecified: Secondary | ICD-10-CM | POA: Diagnosis not present

## 2024-04-14 DIAGNOSIS — I5022 Chronic systolic (congestive) heart failure: Secondary | ICD-10-CM

## 2024-04-14 DIAGNOSIS — I469 Cardiac arrest, cause unspecified: Secondary | ICD-10-CM

## 2024-04-14 DIAGNOSIS — Z79899 Other long term (current) drug therapy: Secondary | ICD-10-CM

## 2024-04-14 NOTE — Patient Instructions (Signed)
 Medication Instructions:  STOP amiodarone    *If you need a refill on your cardiac medications before your next appointment, please call your pharmacy*  Lab Work: Liver function panel (LFT) and thyroid  levels (TSH, free T4) ordered today.  If you have labs (blood work) drawn today and your tests are completely normal, you will receive your results only by: MyChart Message (if you have MyChart) OR A paper copy in the mail If you have any lab test that is abnormal or we need to change your treatment, we will call you to review the results.  Testing/Procedures: No testing/procedures were scheduled today  Follow-Up: At [facility], you and your health needs are our priority.  As part of our continuing mission to provide you with exceptional heart care, our providers are all part of one team.  This team includes your primary Cardiologist (physician) and Advanced Practice Providers or APPs (Physician Assistants and Nurse Practitioners) who all work together to provide you with the care you need, when you need it.  Your next appointment:   1 year(s)  Provider:   You may see Will Gladis Norton, MD or one of the following Advanced Practice Providers on your designated Care Team:   Daphne Barrack, NP    We recommend signing up for the patient portal called MyChart.  Sign up information is provided on this After Visit Summary.  MyChart is used to connect with patients for Virtual Visits (Telemedicine).  Patients are able to view lab/test results, encounter notes, upcoming appointments, etc.  Non-urgent messages can be sent to your provider as well.   To learn more about what you can do with MyChart, go to [email or web address].

## 2024-04-15 ENCOUNTER — Ambulatory Visit

## 2024-04-15 ENCOUNTER — Ambulatory Visit: Payer: Self-pay | Admitting: Pulmonary Disease

## 2024-04-15 DIAGNOSIS — I472 Ventricular tachycardia, unspecified: Secondary | ICD-10-CM | POA: Diagnosis not present

## 2024-04-15 LAB — HEPATIC FUNCTION PANEL
ALT: 21 IU/L (ref 0–44)
AST: 24 IU/L (ref 0–40)
Albumin: 4.5 g/dL (ref 3.8–4.9)
Alkaline Phosphatase: 101 IU/L (ref 47–123)
Bilirubin Total: 0.5 mg/dL (ref 0.0–1.2)
Bilirubin, Direct: 0.14 mg/dL (ref 0.00–0.40)
Total Protein: 6.5 g/dL (ref 6.0–8.5)

## 2024-04-15 LAB — TSH: TSH: 0.705 u[IU]/mL (ref 0.450–4.500)

## 2024-04-15 LAB — T4, FREE: Free T4: 1.88 ng/dL — ABNORMAL HIGH (ref 0.82–1.77)

## 2024-04-16 LAB — CUP PACEART REMOTE DEVICE CHECK
Battery Remaining Longevity: 114 mo
Battery Remaining Percentage: 92 %
Battery Voltage: 2.99 V
Brady Statistic RV Percent Paced: 1 %
Date Time Interrogation Session: 20251119020851
HighPow Impedance: 64 Ohm
Implantable Lead Connection Status: 753985
Implantable Lead Implant Date: 20250404
Implantable Lead Location: 753860
Implantable Pulse Generator Implant Date: 20250404
Lead Channel Impedance Value: 360 Ohm
Lead Channel Pacing Threshold Amplitude: 1 V
Lead Channel Pacing Threshold Pulse Width: 0.5 ms
Lead Channel Sensing Intrinsic Amplitude: 7.2 mV
Lead Channel Setting Pacing Amplitude: 2.5 V
Lead Channel Setting Pacing Pulse Width: 0.5 ms
Lead Channel Setting Sensing Sensitivity: 0.5 mV
Pulse Gen Serial Number: 211022042
Zone Setting Status: 755011

## 2024-04-17 NOTE — Progress Notes (Signed)
 Remote ICD Transmission

## 2024-04-19 ENCOUNTER — Ambulatory Visit: Payer: Self-pay | Admitting: Cardiology

## 2024-04-27 ENCOUNTER — Other Ambulatory Visit

## 2024-04-27 DIAGNOSIS — E063 Autoimmune thyroiditis: Secondary | ICD-10-CM | POA: Diagnosis not present

## 2024-04-28 LAB — BASIC METABOLIC PANEL WITH GFR
BUN/Creatinine Ratio: 13 (calc) (ref 6–22)
BUN: 17 mg/dL (ref 7–25)
CO2: 33 mmol/L — ABNORMAL HIGH (ref 20–32)
Calcium: 9.6 mg/dL (ref 8.6–10.3)
Chloride: 102 mmol/L (ref 98–110)
Creat: 1.34 mg/dL — ABNORMAL HIGH (ref 0.70–1.30)
Glucose, Bld: 141 mg/dL — ABNORMAL HIGH (ref 65–99)
Potassium: 4.8 mmol/L (ref 3.5–5.3)
Sodium: 140 mmol/L (ref 135–146)
eGFR: 64 mL/min/{1.73_m2}

## 2024-04-28 LAB — HEMOGLOBIN A1C
Hgb A1c MFr Bld: 7 % — ABNORMAL HIGH (ref <5.7)
Mean Plasma Glucose: 154 mg/dL
eAG (mmol/L): 8.5 mmol/L

## 2024-04-28 LAB — TSH: TSH: 0.47 m[IU]/L (ref 0.40–4.50)

## 2024-04-28 LAB — LIPID PANEL
Cholesterol: 156 mg/dL
HDL: 55 mg/dL
LDL Cholesterol (Calc): 86 mg/dL
Non-HDL Cholesterol (Calc): 101 mg/dL
Total CHOL/HDL Ratio: 2.8 (calc)
Triglycerides: 61 mg/dL

## 2024-04-28 LAB — T4, FREE: Free T4: 1.6 ng/dL (ref 0.8–1.8)

## 2024-04-30 ENCOUNTER — Telehealth: Admitting: Endocrinology

## 2024-04-30 ENCOUNTER — Encounter: Payer: Self-pay | Admitting: Endocrinology

## 2024-04-30 DIAGNOSIS — E782 Mixed hyperlipidemia: Secondary | ICD-10-CM

## 2024-04-30 DIAGNOSIS — E063 Autoimmune thyroiditis: Secondary | ICD-10-CM

## 2024-04-30 DIAGNOSIS — E1065 Type 1 diabetes mellitus with hyperglycemia: Secondary | ICD-10-CM

## 2024-04-30 MED ORDER — INSULIN ASPART 100 UNIT/ML IJ SOLN: INTRAMUSCULAR | 3 refills | Status: AC

## 2024-04-30 NOTE — Progress Notes (Signed)
 Outpatient Endocrinology Note Johnathan Sivak, MD  04/30/24  Patient's Name: Johnathan Ross    DOB: 1970-06-28    MRN: [id number]                                                    REASON OF VISIT: Follow up for type 1 diabetes mellitus  PCP: [name], MD  I connected with  Jameer E Vawter on 04/30/24 by a video enabled telemedicine application and verified that I am speaking with the correct person using two identifiers.   I discussed the limitations of evaluation and management by telemedicine. The patient expressed understanding and agreed to proceed.   Provider location: office Patient location: Home  HISTORY OF PRESENT ILLNESS:   Johnathan Ross is a 53 y.o. old male with past medical history listed below, is here for follow up of type 1 diabetes mellitus and hypothyroidism.  Pertinent Diabetes History: Patient was previously seen by Dr. Von and was last time seen in June 2024. _Diagnosed as diabetes melitis type 1 in 1995.  He has been on insulin  pump therapy since 2012.  On Medtronic 780G since September 2023.  Chronic Diabetes Complications : Retinopathy: yes proliferative retinopathy of both eyes. Last ophthalmology exam was done on every 6 months  Nephropathy: no, on losartan  Peripheral neuropathy: yes, from MS, on gabapentin  Coronary artery disease: no Stroke: no  Relevant comorbidities and cardiovascular risk factors: Obesity: no There is no height or weight on file to calculate BMI.  Hypertension: yes Hyperlipidemia. Yes, on statin.   Current / Home Diabetic regimen includes:  Medtronic 780G using NovoLog  U100  Insulin  Pump setting:  Basal ( 21.275 units/day) MN- 0.00 u/hour 4AM- 1.25  6AM- 1.05 9AM- 1.25 9 PM-   0.125 10PM-  0.100  Bolus CHO Ratio (1unit:CHO) MN- 1:15 7AM- 1:8 12PM- 1:10 6PM- 1:10 10PM-  1:15  Correction/Sensitivity: MN- 1:35  Target: 94-136  Active insulin  time: 2 hours  Prior diabetic  medications:   CONTINUOUS GLUCOSE MONITORING SYSTEM (CGMS) / INSULIN  PUMP INTERPRETATION:                     Medtronic Pump & Sensor Download (Reviewed and summarized below.) Medtronic 780G Date: November 20 -April 29, 2024, 14 day GMI : 6.9% Smart guard mode : 93% Sensor usage : 92% Total daily dose 84 units, basal 26%, bolus   74%  Interpretation: Mostly acceptable blood sugar with rare transient hyperglycemia with blood sugar up to 300 postprandially mostly related to late meal bolus.  Blood sugar in between the meals are acceptable.  Most of the blood sugar postprandially are acceptable.  No concerning hypoglycemia.  Hypoglycemia: Patient has no concerning hypoglycemic episodes. Patient has hypoglycemia awareness.    Factors modifying glucose control: 1.  Diabetic diet assessment: 3 meals a day.  2.  Staying active or exercising: Not able to exercise.  3.  Medication compliance: compliant all of the time.  # Primary hypothyroidism  -Longstanding history of primary 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 autoimmune.  He was only taking levothyroxine  / synthroid  125 mcg 6 days a week.  Dose was increased to 137 mcg daily in August 2025 when TSH was elevated.  # He has multiple sclerosis, following with neurology.  # Patient had V-fib cardiac arrest in March 2025, following with cardiology, on amiodarone   and beta-blocker.  Interval history  Insulin  pump and CGM data as reviewed above.  Recent laboratory results reviewed hemoglobin A1c improved to 7%.  Normal thyroid  function test.  Normal electrolytes.  Mildly elevated creatinine of 1.34.  Acceptable cholesterol level with LDL 86.  He has been taking Lasix .  Following with cardiology, advised to discuss with cardiology and reports has follow-up visit in the near future, asked to monitor /repeat renal function.  He has been taking levothyroxine  137 mcg daily.  Denies hypo and hyperthyroid symptoms.  No other complaints today.  This is a  video MyChart visit requested by patient.  He is wheelchair-bound.  Was able to connect on video visit however had audio technical issue and also talked over the phone.  REVIEW OF SYSTEMS As per history of present illness.   PAST MEDICAL HISTORY: Past Medical History:  Diagnosis Date   Hyperlipidemia    Hypertension    Hypogonadism in male 11/01/2016   Hypothyroidism    Multiple sclerosis    Neuromuscular disorder (HCC)    Proliferative diabetic retinopathy(362.02)    Type 1 diabetes mellitus (HCC) dx'd 1994   Vitamin D  insufficiency 11/29/2016    PAST SURGICAL HISTORY: Past Surgical History:  Procedure Laterality Date   EYE SURGERY Bilateral    laser for diabetic retinopathy   FRACTURE SURGERY     ICD IMPLANT N/A 08/30/2023   Procedure: ICD IMPLANT;  Surgeon: Inocencio Soyla Lunger, MD;  Location: [facility];  Service: Cardiovascular;  Laterality: N/A;   IR VENO/EXT/UNI LEFT  03/24/2020   OPEN REDUCTION INTERNAL FIXATION (ORIF) TIBIA/FIBULA FRACTURE Right 10/30/2016   ORIF ANKLE FRACTURE Left 2015   ORIF TIBIA FRACTURE Right 10/30/2016   Procedure: OPEN REDUCTION INTERNAL FIXATION (ORIF) TIBIA FIBULA FRACTURE;  Surgeon: Celena Sharper, MD;  Location: [facility] OR;  Service: Orthopedics;  Laterality: Right;   RETINAL LASER PROCEDURE Bilateral    for diabetic retinopathy   RIGHT/LEFT HEART CATH AND CORONARY ANGIOGRAPHY N/A 08/26/2023   Procedure: RIGHT/LEFT HEART CATH AND CORONARY ANGIOGRAPHY;  Surgeon: Gardenia Led, DO;  Location: [facility] INVASIVE [facility];  Service: Cardiovascular;  Laterality: N/A;    ALLERGIES: Allergies  Allergen Reactions   Zestril  [Lisinopril ] Cough   Ivp Dye [Iodinated Contrast Media] Hives and Itching    FAMILY HISTORY:  Family History  Problem Relation Age of Onset   Diabetes Sister    Colon polyps Maternal Uncle    Colon cancer Maternal Grandmother    Stomach cancer Cousin    Esophageal cancer Neg Hx     SOCIAL HISTORY: Social History    Socioeconomic History   Marital status: Single    Spouse name: 12   Number of children: 1   Years of education: Not on file   Highest education level: Not on file  Occupational History   Occupation: unemployed  Tobacco Use   Smoking status: Never    Passive exposure: Never   Smokeless tobacco: Never  Vaping Use   Vaping status: Never Used  Substance and Sexual Activity   Alcohol use: No   Drug use: No   Sexual activity: Not on file  Other Topics Concern   Not on file  Social History Narrative   Right handed   No caffeine    Lives with girlfriend   Social Drivers of Health   Financial Resource Strain: Low Risk  (09/11/2023)   Overall Financial Resource Strain (CARDIA)    Difficulty of Paying Living Expenses: Not hard at all  Food  Insecurity: No Food Insecurity (09/11/2023)   Hunger Vital Sign    Worried About Running Out of Food in the Last Year: Never true    Ran Out of Food in the Last Year: Never true  Transportation Needs: No Transportation Needs (09/11/2023)   PRAPARE - Administrator, Civil Service (Medical): No    Lack of Transportation (Non-Medical): No  Physical Activity: Sufficiently Active (09/11/2023)   Exercise Vital Sign    Days of Exercise per Week: 5 days    Minutes of Exercise per Session: 30 min  Stress: No Stress Concern Present (09/11/2023)   [employer] of Occupational Health - Occupational Stress Questionnaire    Feeling of Stress : Not at all  Social Connections: Socially Isolated (09/11/2023)   Social Connection and Isolation Panel    Frequency of Communication with Friends and Family: More than three times a week    Frequency of Social Gatherings with Friends and Family: More than three times a week    Attends Religious Services: Never    Database Administrator or Organizations: No    Attends Engineer, Structural: Never    Marital Status: Never married    MEDICATIONS:  Current Outpatient Medications  Medication  Sig Dispense Refill   acetaminophen  (TYLENOL ) 325 MG tablet Take 1-2 tablets (325-650 mg total) by mouth every 4 (four) hours as needed for mild pain (pain score 1-3).     aspirin  EC 81 MG tablet Take 1 tablet (81 mg total) by mouth daily. Swallow whole.     bisoprolol  (ZEBETA ) 5 MG tablet Take 1 tablet (5 mg total) by mouth daily. 30 tablet 6   dalfampridine  10 MG TB12 Take 1 tablet (10 mg total) by mouth 2 (two) times daily. 180 tablet 3   furosemide  (LASIX ) 20 MG tablet Take 1 tablet (20 mg total) by mouth 3 (three) times a week. Take three times weekly on Mon/Wed/Fri 45 tablet 3   insulin  aspart (NOVOLOG ) 100 UNIT/ML injection USE UP TO 100 UNITS max dose IN INSULIN  PUMP DAILY-DX CODE E10.8 90 mL 3   Insulin  Human (INSULIN  PUMP) SOLN Inject 1 each into the skin 3 times daily with meals, bedtime and 2 AM.     levocetirizine (XYZAL ) 5 MG tablet TAKE 1 TABLET BY MOUTH EVERY DAY IN THE EVENING 90 tablet 1   levothyroxine  (SYNTHROID ) 137 MCG tablet Take 1 tablet (137 mcg total) by mouth daily at 6 (six) AM. 90 tablet 3   losartan  (COZAAR ) 50 MG tablet Take 1 tablet (50 mg total) by mouth daily. 30 tablet 5   rOPINIRole  (REQUIP ) 0.25 MG tablet TAKE 1 TABLET BY MOUTH AT BEDTIME. 90 tablet 1   simvastatin  (ZOCOR ) 20 MG tablet TAKE 1 TABLET BY MOUTH DAILY AT 6 PM. 90 tablet 1   vitamin D3 (CHOLECALCIFEROL ) 25 MCG tablet Take 1 tablet (1,000 Units total) by mouth daily. 30 tablet 0   No current facility-administered medications for this visit.    PHYSICAL EXAM: There were no vitals filed for this visit.   There is no height or weight on file to calculate BMI.  Wt Readings from Last 3 Encounters:  04/14/24 217 lb 3.2 oz (98.5 kg)  01/24/24 210 lb (95.3 kg)  01/08/24 216 lb (98 kg)    General: Well developed, well nourished male in no apparent distress.  HEENT: AT/[place], no external lesions.  Eyes: Conjunctiva clear and no icterus. Neurologic: Alert, oriented, normal speech Psychiatric: Does not  appear  depressed or anxious  Diabetic Foot Exam - Simple   No data filed    LABS Reviewed Lab Results  Component Value Date   HGBA1C 7.0 (H) 04/27/2024   HGBA1C 8.0 (H) 01/16/2024   HGBA1C 7.9 (H) 12/16/2023   Lab Results  Component Value Date   FRUCTOSAMINE 303 (H) 07/08/2023   FRUCTOSAMINE 383 (H) 12/03/2019   FRUCTOSAMINE 355 (H) 08/27/2018   Lab Results  Component Value Date   CHOL 156 04/27/2024   HDL 55 04/27/2024   LDLCALC 86 04/27/2024   LDLDIRECT 65.0 02/09/2021   TRIG 61 04/27/2024   CHOLHDL 2.8 04/27/2024   Lab Results  Component Value Date   MICRALBCREAT Unable to calculate 12/16/2023   MICRALBCREAT 31.3 (H) 12/27/2008   Lab Results  Component Value Date   CREATININE 1.34 (H) 04/27/2024   Lab Results  Component Value Date   GFR 63.09 12/16/2023    ASSESSMENT / PLAN  1. Uncontrolled type 1 diabetes mellitus with hyperglycemia (HCC)   2. Acquired autoimmune hypothyroidism   3. Mixed hyperlipidemia    Diabetes Mellitus type 1, complicated by diabetic retinopathy, possibly neuropathy. - Diabetic status / severity: uncontrolled.  Improving.  Lab Results  Component Value Date   HGBA1C 7.0 (H) 04/27/2024    - Hemoglobin A1c goal <6.5%   Mostly settable blood sugar.  - Medications:  Insulin  pump setting changed as follows.   Medtronic 780G using NovoLog  U100  Insulin  Pump setting:  Basal ( 21.275 units/day) MN- 0.00 , changed to 0.075 u/hour 4AM- 1.25  6AM- 1.05 9AM- 1.25 9 PM-   0.125 10PM-  0.100  Bolus CHO Ratio (1unit:CHO) MN- 1:15 7AM- 1:8 12PM- 1:10 6PM- 1:10 10PM-       1:15  Correction/Sensitivity: MN- 1:35  Target: 94-136  Active insulin  time: 2 hours  - Home glucose testing: continue CGM and check blood glucose as needed.  - Discussed/ Gave Hypoglycemia treatment plan.  # Consult : not required at this time.  # Annual urine for microalbuminuria/ creatinine ratio, no microalbuminuria currently, continue ACE/ARB  /losartan .   Last  Lab Results  Component Value Date   MICRALBCREAT Unable to calculate 12/16/2023    # Foot check nightly / neuropathy, continue gabapentin .  Managed by neurology/PCP.  # He has proliferative diabetic retinopathy, regularly following with ophthalmology..   - Diet: Make healthy diabetic food choices.  Discussed about reducing carb content in the meals.  2. Blood pressure  -  BP Readings from Last 1 Encounters:  04/14/24 126/88    - Control is in target.  This is a video visit, blood pressure not checked today.  - No change in current plans.  3. Lipid status / Hyperlipidemia - Last  Lab Results  Component Value Date   LDLCALC 86 04/27/2024   - Continue simvastatin  20 mg daily.  # Primary hypothyroidism -Continue current dose of levothyroxine  137 mcg daily.  Recent thyroid  function test normal.  # He prefers to have lab visit prior to follow-up visit.  Diagnoses and all orders for this visit:  Uncontrolled type 1 diabetes mellitus with hyperglycemia (HCC) -     Basic metabolic panel with GFR -     Hemoglobin A1c -     insulin  aspart (NOVOLOG ) 100 UNIT/ML injection; USE UP TO 100 UNITS max dose IN INSULIN  PUMP DAILY-DX CODE E10.8  Acquired autoimmune hypothyroidism -     T4, free -     TSH  Mixed hyperlipidemia    DISPOSITION Follow  up in clinic in 3 months suggested.  He prefers to have video virtual visit.  Labs prior to follow-up visi as ordered.   All questions answered and patient verbalized understanding of the plan.  Faelynn Wynder, MD [facility] Endocrinology [facility] [address] [place], [address] [place], [place] [address] Phone # [phone]  At least part of this note was generated using voice recognition software. Inadvertent word errors may have occurred, which were not recognized during the proofreading process.

## 2024-05-01 ENCOUNTER — Telehealth: Payer: Self-pay | Admitting: Pharmacist

## 2024-05-01 ENCOUNTER — Encounter: Payer: Self-pay | Admitting: Endocrinology

## 2024-05-01 NOTE — Progress Notes (Signed)
 Pharmacy Quality Measure Review  This patient is appearing on a report for being at risk of failing the adherence measure for cholesterol (statin) medications this calendar year.   Medication: Simvastatin  20 mg Last fill date: 01/06/24 for 90 day supply  Contacted patient. He notes he is taking daily and still has supply remaining. Refill was sent from cardio in November, will just need to contact his pharmacy to get medication refilled when needed.  Darrelyn Drum, PharmD, BCPS, CPP Clinical Pharmacist Practitioner [facility] [phone]

## 2024-05-09 ENCOUNTER — Ambulatory Visit (HOSPITAL_COMMUNITY)

## 2024-05-09 ENCOUNTER — Encounter (HOSPITAL_COMMUNITY): Payer: Self-pay | Admitting: Emergency Medicine

## 2024-05-09 ENCOUNTER — Ambulatory Visit (HOSPITAL_COMMUNITY): Payer: Self-pay

## 2024-05-09 ENCOUNTER — Ambulatory Visit (HOSPITAL_COMMUNITY)
Admission: EM | Admit: 2024-05-09 | Discharge: 2024-05-09 | Disposition: A | Discharge status: Home / Self Care | Attending: Emergency Medicine | Admitting: Emergency Medicine

## 2024-05-09 ENCOUNTER — Other Ambulatory Visit: Payer: Self-pay

## 2024-05-09 DIAGNOSIS — S2231XA Fracture of one rib, right side, initial encounter for closed fracture: Secondary | ICD-10-CM

## 2024-05-09 DIAGNOSIS — R0789 Other chest pain: Secondary | ICD-10-CM

## 2024-05-09 DIAGNOSIS — W19XXXA Unspecified fall, initial encounter: Secondary | ICD-10-CM | POA: Diagnosis not present

## 2024-05-09 MED ORDER — HYDROCODONE-ACETAMINOPHEN 5-325 MG PO TABS: 1.0000 | ORAL_TABLET | Freq: Four times a day (QID) | ORAL | 0 refills | Status: DC | PRN

## 2024-05-09 MED ORDER — BACLOFEN 10 MG PO TABS: 10.0000 mg | ORAL_TABLET | Freq: Three times a day (TID) | ORAL | 0 refills | Status: DC

## 2024-05-09 NOTE — Discharge Instructions (Addendum)
 Based on my interpretation your x-ray did reveal a single rib fracture.  This will heal on its own. I have prescribed a few tablets of Norco that you can take every 6 hours as needed for severe pain.  This can cause drowsiness so do not drive, work, or drink alcohol while taking this. Alternate between ice and heat as needed for pain. Follow-up with your primary care provider or return here as needed.

## 2024-05-09 NOTE — ED Provider Notes (Addendum)
 [facility]    CSN: [id number] Arrival date & time: 05/09/24  9162      History   Chief Complaint Chief Complaint  Patient presents with   Fall    HPI Johnathan Ross is a 53 y.o. male.   Patient reports that he tripped and fell on 12/10 hitting the right side of his rib cage on the arm of a chair.  Patient denies any other injuries from this fall.  Patient denies hitting his head or loss of consciousness.  Patient states that he felt a little short of breath shortly after the fall, but has not had continued shortness of breath since then.  Patient reports that the right side of his rib cage is tender to touch.  Patient states that he has been taking ibuprofen with some relief of pain.  Patient endorses increased pain with movement and states that he will have spasms of pain to this area as well.  Patient denies noticing any bruising.  The history is provided by the patient and medical records.  Fall    Past Medical History:  Diagnosis Date   Hyperlipidemia    Hypertension    Hypogonadism in male 11/01/2016   Hypothyroidism    Multiple sclerosis    Neuromuscular disorder (HCC)    Proliferative diabetic retinopathy(362.02)    Type 1 diabetes mellitus (HCC) dx'd 1994   Vitamin D  insufficiency 11/29/2016    Patient Active Problem List   Diagnosis Date Noted   Mass of right lower leg 11/13/2023   Attention and concentration deficit 09/10/2023   Need for immunization against influenza 06/17/2023   Loud snoring 06/17/2023   Encounter for general adult medical examination with abnormal findings 08/02/2022   Screening for prostate cancer 08/01/2021   OAB (overactive bladder) 09/21/2020   Essential hypertension 09/21/2020   Thoracic outlet syndrome 05/03/2020   Controlled type 1 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 (HCC) 03/24/2020   Right epiretinal membrane 03/24/2020   Retinal exudates and deposits 03/24/2020   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   Nuclear sclerotic cataract of both eyes 03/24/2020   Gait disorder 03/23/2020   Benign prostatic hyperplasia without lower urinary tract symptoms 03/01/2020   Diabetic gastroparesis associated with type 1 diabetes mellitus (HCC) 03/01/2020   Gait disturbance 08/19/2019   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/19/2019   Vitamin D  insufficiency 11/29/2016   Hypogonadism in male 11/01/2016   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HCC)    Hypothyroidism    Hypertension    Dyslipidemia, goal LDL below 130    MULTIPLE SCLEROSIS 08/29/2007   PROLIFERATIVE DIABETIC RETINOPATHY 08/29/2007    Past Surgical History:  Procedure Laterality Date   EYE SURGERY Bilateral    laser for diabetic retinopathy   FRACTURE SURGERY     ICD IMPLANT N/A 08/30/2023   Procedure: ICD IMPLANT;  Surgeon: Inocencio Soyla Lunger, MD;  Location: [facility];  Service: Cardiovascular;  Laterality: N/A;   IR VENO/EXT/UNI LEFT  03/24/2020   OPEN REDUCTION INTERNAL FIXATION (ORIF) TIBIA/FIBULA FRACTURE Right 10/30/2016   ORIF ANKLE FRACTURE Left 2015   ORIF TIBIA FRACTURE Right 10/30/2016   Procedure: OPEN REDUCTION INTERNAL FIXATION (ORIF) TIBIA FIBULA FRACTURE;  Surgeon: Celena Sharper, MD;  Location: [facility];  Service: Orthopedics;  Laterality: Right;   RETINAL LASER PROCEDURE Bilateral    for diabetic retinopathy   RIGHT/LEFT HEART CATH AND CORONARY ANGIOGRAPHY N/A 08/26/2023   Procedure: RIGHT/LEFT HEART CATH AND CORONARY ANGIOGRAPHY;  Surgeon: Gardenia Led, DO;  Location: [facility];  Service: Cardiovascular;  Laterality: N/A;       Home Medications    Prior to Admission medications  Medication Sig Start Date End Date Taking? Authorizing Provider  HYDROcodone -acetaminophen  (NORCO/VICODIN) 5-325 MG tablet Take 1 tablet by mouth every 6 (six) hours as needed for severe pain (pain score 7-10). 05/09/24  Yes Johnie Flaming A, NP  acetaminophen  (TYLENOL ) 325  MG tablet Take 1-2 tablets (325-650 mg total) by mouth every 4 (four) hours as needed for mild pain (pain score 1-3). 09/10/23   Angiulli, Toribio PARAS, PA-C  aspirin  EC 81 MG tablet Take 1 tablet (81 mg total) by mouth daily. Swallow whole. 09/11/23   Angiulli, Toribio PARAS, PA-C  bisoprolol  (ZEBETA ) 5 MG tablet Take 1 tablet (5 mg total) by mouth daily. 10/08/23   Milford, Harlene HERO, FNP  dalfampridine  10 MG TB12 Take 1 tablet (10 mg total) by mouth 2 (two) times daily. 06/20/23   Sater, Charlie LABOR, MD  furosemide  (LASIX ) 20 MG tablet Take 1 tablet (20 mg total) by mouth 3 (three) times a week. Take three times weekly on Mon/Wed/Fri 02/03/24   Glena Harlene HERO, FNP  insulin  aspart (NOVOLOG ) 100 UNIT/ML injection USE UP TO 100 UNITS max dose IN INSULIN  PUMP DAILY-DX CODE E10.8 04/30/24   Thapa, Sudan, MD  Insulin  Human (INSULIN  PUMP) SOLN Inject 1 each into the skin 3 times daily with meals, bedtime and 2 AM. 09/11/23   Angiulli, Toribio PARAS, PA-C  levocetirizine (XYZAL ) 5 MG tablet TAKE 1 TABLET BY MOUTH EVERY DAY IN THE EVENING 03/04/24   [name], MD  levothyroxine  (SYNTHROID ) 137 MCG tablet Take 1 tablet (137 mcg total) by mouth daily at 6 (six) AM. 01/28/24   Thapa, Sudan, MD  losartan  (COZAAR ) 50 MG tablet Take 1 tablet (50 mg total) by mouth daily. 12/03/23   Sabharwal, Aditya, DO  rOPINIRole  (REQUIP ) 0.25 MG tablet TAKE 1 TABLET BY MOUTH AT BEDTIME. 01/10/24   [name], MD  simvastatin  (ZOCOR ) 20 MG tablet TAKE 1 TABLET BY MOUTH DAILY AT 6 PM. 04/03/24   Milford, Harlene HERO, FNP  vitamin D3 (CHOLECALCIFEROL ) 25 MCG tablet Take 1 tablet (1,000 Units total) by mouth daily. 10/14/23   [name], MD    Family History Family History  Problem Relation Age of Onset   Diabetes Sister    Colon polyps Maternal Uncle    Colon cancer Maternal Grandmother    Stomach cancer Cousin    Esophageal cancer Neg Hx     Social History Social History[1]   Allergies   Zestril  [lisinopril ] and Ivp dye  [iodinated contrast media]   Review of Systems Review of Systems  Per HPI  Physical Exam Triage Vital Signs ED Triage Vitals  Encounter Vitals Group     BP 05/09/24 0925 126/71     Girls Systolic BP Percentile --      Girls Diastolic BP Percentile --      Boys Systolic BP Percentile --      Boys Diastolic BP Percentile --      Pulse Rate 05/09/24 0925 67     Resp 05/09/24 0925 20     Temp 05/09/24 0925 98.9 F (37.2 C)     Temp Source 05/09/24 0925 Oral     SpO2 05/09/24 0925 98 %     Weight --      Height --      Head Circumference --  Peak Flow --      Pain Score 05/09/24 0922 10     Pain Loc --      Pain Education --      Exclude from Growth Chart --    No data found.  Updated Vital Signs BP 126/71 (BP Location: Left Arm)   Pulse 67   Temp 98.9 F (37.2 C) (Oral)   Resp 20   SpO2 98%   Visual Acuity Right Eye Distance:   Left Eye Distance:   Bilateral Distance:    Right Eye Near:   Left Eye Near:    Bilateral Near:     Physical Exam Vitals and nursing note reviewed.  Constitutional:      General: He is awake. He is not in acute distress.    Appearance: Normal appearance. He is well-developed and well-groomed. He is not ill-appearing.  Cardiovascular:     Rate and Rhythm: Normal rate and regular rhythm.     Heart sounds: Normal heart sounds.  Pulmonary:     Effort: Pulmonary effort is normal.     Breath sounds: Normal breath sounds.  Chest:     Chest wall: Tenderness present. No mass, lacerations, deformity, swelling or edema.       Comments: Tenderness noted to right lateral chest wall over mid to lower rib cage Skin:    General: Skin is warm and dry.  Neurological:     Mental Status: He is alert.  Psychiatric:        Behavior: Behavior is cooperative.      UC Treatments / Results  Labs (all labs ordered are listed, but only abnormal results are displayed) Labs Reviewed - No data to display  EKG   Radiology DG Ribs  Unilateral W/Chest Right Result Date: 05/09/2024 EXAM: 3 VIEW(S) XRAY OF THE RIGHT RIBS AND CHEST 05/09/2024 09:55:04 AM COMPARISON: Chest radiographs 09/10/2023 and earlier. CLINICAL HISTORY: 53 year old male with right rib pain after fall. FINDINGS: BONES: Acute minimally displaced right 9th rib fracture. Mild displacement of the right lateral 8th rib suspicious for acute fracture there also. Chronic appearing anterior right 5th rib fracture. Normal thoracic segmentation. LUNGS AND PLEURA: No consolidation or pulmonary edema. No pleural effusion or pneumothorax. HEART AND MEDIASTINUM: Left pacemaker device. No acute abnormality of the cardiac and mediastinal silhouettes. IMPRESSION: 1. Acute minimally displaced right ninth rib fracture. And suspicious for right 8th rib fracture also, chronic right 5th rib fracture. 2. No acute cardiopulmonary abnormality. Electronically signed by: Helayne Hurst MD 05/09/2024 10:45 AM EST RP Workstation: [id number]    Procedures Procedures (including critical care time)  Medications Ordered in UC Medications - No data to display  Initial Impression / Assessment and Plan / UC Course  I have reviewed the triage vital signs and the nursing notes.  Pertinent labs & imaging results that were available during my care of the patient were reviewed by me and considered in my medical decision making (see chart for details).     Patient is overall well-appearing.  Vitals are stable.  X-ray ordered.  Based on my interpretation there does appear to be a single rib fracture present.  Radiology report confirms this and suggests suspicion of eighth rib fracture as well.  PDMP reviewed.  Prescribed a few tablets of Norco as needed for severe pain.  Recommended alternate between ice and heat as needed for pain.  Discussed follow-up and return precautions. Final Clinical Impressions(s) / UC Diagnoses   Final diagnoses:  Rib pain on right side  Closed fracture of one rib of  right side, initial encounter  Fall, initial encounter     Discharge Instructions      Based on my interpretation your x-ray did reveal a single rib fracture.  This will heal on its own. I have prescribed a few tablets of Norco that you can take every 6 hours as needed for severe pain.  This can cause drowsiness so do not drive, work, or drink alcohol while taking this. Alternate between ice and heat as needed for pain. Follow-up with your primary care provider or return here as needed.     ED Prescriptions     Medication Sig Dispense Auth. Provider   baclofen  (LIORESAL ) 10 MG tablet  (Status: Discontinued) Take 1 tablet (10 mg total) by mouth 3 (three) times daily. 30 each Johnie Flaming A, NP   HYDROcodone -acetaminophen  (NORCO/VICODIN) 5-325 MG tablet Take 1 tablet by mouth every 6 (six) hours as needed for severe pain (pain score 7-10). 10 tablet Johnie Flaming A, NP      I have reviewed the PDMP during this encounter.    Johnie Flaming A, NP 05/09/24 1049     [1]  Social History Tobacco Use   Smoking status: Never    Passive exposure: Never   Smokeless tobacco: Never  Vaping Use   Vaping status: Never Used  Substance Use Topics   Alcohol use: No   Drug use: No     Johnie Flaming A, NP 05/09/24 1050

## 2024-05-09 NOTE — ED Triage Notes (Signed)
 Patient fell on 05/06/2024.  Reports he was tired and fell hitting right ribcage on the arms of a chair.  Took ibuprofen this morning at 8:00 am, 400 mg  Pain with movement, and unpredictable spasms

## 2024-06-03 ENCOUNTER — Other Ambulatory Visit (HOSPITAL_COMMUNITY): Payer: Self-pay | Admitting: Family Medicine

## 2024-06-04 ENCOUNTER — Telehealth (HOSPITAL_COMMUNITY): Payer: Self-pay

## 2024-06-04 MED ORDER — LOSARTAN POTASSIUM 50 MG PO TABS: 50.0000 mg | ORAL_TABLET | Freq: Every day | ORAL | 5 refills | Status: AC

## 2024-06-04 NOTE — Telephone Encounter (Signed)
 Refill sent to pharmacy.

## 2024-06-09 ENCOUNTER — Telehealth (HOSPITAL_COMMUNITY): Payer: Self-pay | Admitting: Cardiology

## 2024-06-09 NOTE — Telephone Encounter (Signed)
 Called to confirm/remind patient of their appointment at the [facility] on 06/09/2024.   Appointment:   [] Confirmed  [x] Left mess   [] No answer/No voice mail  [] VM Full/unable to leave message  [] Phone not in service  Patient reminded to bring all medications and/or complete list.  Confirmed patient has transportation. Gave directions, instructed to utilize valet parking.

## 2024-06-10 ENCOUNTER — Ambulatory Visit (HOSPITAL_COMMUNITY)
Admission: RE | Admit: 2024-06-10 | Discharge: 2024-06-10 | Disposition: A | Discharge status: Home / Self Care | Source: Ambulatory Visit | Attending: Cardiology | Admitting: Cardiology

## 2024-06-10 ENCOUNTER — Other Ambulatory Visit (HOSPITAL_COMMUNITY): Payer: Self-pay | Admitting: Cardiology

## 2024-06-10 ENCOUNTER — Encounter (HOSPITAL_COMMUNITY): Payer: Self-pay | Admitting: Cardiology

## 2024-06-10 DIAGNOSIS — Z8674 Personal history of sudden cardiac arrest: Secondary | ICD-10-CM | POA: Insufficient documentation

## 2024-06-10 DIAGNOSIS — Z95810 Presence of automatic (implantable) cardiac defibrillator: Secondary | ICD-10-CM | POA: Diagnosis not present

## 2024-06-10 DIAGNOSIS — I428 Other cardiomyopathies: Secondary | ICD-10-CM | POA: Insufficient documentation

## 2024-06-10 DIAGNOSIS — G35D Multiple sclerosis, unspecified: Secondary | ICD-10-CM | POA: Diagnosis not present

## 2024-06-10 DIAGNOSIS — R600 Localized edema: Secondary | ICD-10-CM | POA: Insufficient documentation

## 2024-06-10 DIAGNOSIS — Z79899 Other long term (current) drug therapy: Secondary | ICD-10-CM | POA: Insufficient documentation

## 2024-06-10 DIAGNOSIS — I5022 Chronic systolic (congestive) heart failure: Secondary | ICD-10-CM

## 2024-06-10 MED ORDER — FUROSEMIDE 20 MG PO TABS: 20.0000 mg | ORAL_TABLET | ORAL | 3 refills | Status: DC

## 2024-06-10 MED ORDER — FUROSEMIDE 20 MG PO TABS: 40.0000 mg | ORAL_TABLET | ORAL | 3 refills | Status: DC

## 2024-06-10 NOTE — Patient Instructions (Addendum)
 CHANGE Lasix  to 40 mg twice a week.  Your physician has requested that you have an echocardiogram. Echocardiography is a painless test that uses sound waves to create images of your heart. It provides your doctor with information about the size and shape of your heart and how well your hearts chambers and valves are working. This procedure takes approximately one hour. There are no restrictions for this procedure. Please do NOT wear cologne, perfume, aftershave, or lotions (deodorant is allowed). Please arrive 15 minutes prior to your appointment time.  Please note: We ask at that you not bring children with you during ultrasound (echo/ vascular) testing. Due to room size and safety concerns, children are not allowed in the ultrasound rooms during exams. Our front office staff cannot provide observation of children in our lobby area while testing is being conducted. An adult accompanying a patient to their appointment will only be allowed in the ultrasound room at the discretion of the ultrasound technician under special circumstances. We apologize for any inconvenience.  Your physician recommends that you schedule a follow-up appointment in: 6 months ( July) ** PLEASE CALL THE OFFICE IN MAY TO ARRANGE YOUR FOLLOW UP APPOINTMENT.**  If you have any questions or concerns before your next appointment please send us  a message through [place] or call our office at [phone].    TO LEAVE A MESSAGE FOR THE NURSE SELECT OPTION 2, PLEASE LEAVE A MESSAGE INCLUDING: YOUR NAME DATE OF BIRTH CALL BACK NUMBER REASON FOR CALL**this is important as we prioritize the call backs  YOU WILL RECEIVE A CALL BACK THE SAME DAY AS LONG AS YOU CALL BEFORE 4:00 PM  At the [facility], you and your health needs are our priority. As part of our continuing mission to provide you with exceptional heart care, we have created designated Provider Care Teams. These Care Teams include your primary  Cardiologist (physician) and Advanced Practice Providers (APPs- Physician Assistants and Nurse Practitioners) who all work together to provide you with the care you need, when you need it.   You may see any of the following providers on your designated Care Team at your next follow up: Dr Toribio Fuel Dr Ezra Shuck Dr. Morene Brownie Greig Mosses, NP Caffie Shed, [place] [facility] [place], [place] Beckey Coe, NP Jordan Lee, NP Ellouise Class, NP Tinnie Redman, PharmD Jaun Bash, PharmD   Please be sure to bring in all your medications bottles to every appointment.    Thank you for choosing [facility]

## 2024-06-10 NOTE — Progress Notes (Signed)
" ° °  ADVANCED [facility] FAILURE FOLLOW UP CLINIC NOTE  Referring Physician: [name], MD  Primary Care: [name], MD Primary Cardiologist:  HPI: Johnathan Ross is a 54 y.o. male who presents for follow up of chronic systolic heart faliure.      Admitted March 2025 with V-fib arrest, moderately reduced ejection fraction, left heart catheterization at the time without obstructive CAD.  ICD was implanted at that admission.  Ejection fraction had recovered to 50 to 55% in July.     SUBJECTIVE:  Patient overall reports that he is doing fair, he continues to have lower extremity edema.  Denies any significant worsening shortness of breath, orthopnea.  Denies any active chest pain.  No recent arrhythmias on interrogation.  PMH, current medications, allergies, social history, and family history reviewed in epic.  PHYSICAL EXAM: Vitals:   06/10/24 1129  BP: (!) 144/80  Pulse: 65  SpO2: 99%   GENERAL: NAD, well appearing PULM:  Normal work of breathing, CTAB CARDIAC:  JVP: flat         Normal rate with regular rhythm. No murmurs, rubs or gallops.  1+ edema. Warm and well perfused extremities. ABDOMEN: Soft, non-tender, non-distended. NEUROLOGIC: Patient is oriented x3 with no focal or lateralizing neurologic deficits.     DATA REVIEW  ECG: 12/02/2023: Normal sinus rhythm  ECHO: 08/16/2023: LVEF 30 to 35%, mildly reduced RV 12/03/2023: LVEF 50 to 55%, normal RV  CATH: 07/2023: RA 12, PA 38/20 (26), PCWP 14, TD CO/CI 4.39/1.98  ASSESSMENT & PLAN:  Chronic systolic heart failure: Nonischemic cardiomyopathy, significant improvement in LVEF.  Has been taking Lasix  20 mg daily with persistent lower extremity edema, does not urinate more with the Lasix  dose. 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 -Taken off amiodarone  03/2024 by EP given no recurrent arrhythmia in 6 months - No arrhythmias noted on device interrogation today  Multiple sclerosis:  - Followed by neurology  Follow up in 6 months  Morene Brownie, MD Advanced [facility] Mechanical Circulatory Support 06/14/24 "

## 2024-06-17 LAB — OPHTHALMOLOGY REPORT-SCANNED

## 2024-06-18 ENCOUNTER — Ambulatory Visit: Payer: Self-pay | Admitting: Endocrinology

## 2024-06-19 ENCOUNTER — Other Ambulatory Visit: Payer: Self-pay | Admitting: Neurology

## 2024-06-19 DIAGNOSIS — R269 Unspecified abnormalities of gait and mobility: Secondary | ICD-10-CM

## 2024-06-19 DIAGNOSIS — G35D Multiple sclerosis, unspecified: Secondary | ICD-10-CM

## 2024-06-22 ENCOUNTER — Other Ambulatory Visit: Payer: Self-pay

## 2024-06-28 ENCOUNTER — Other Ambulatory Visit: Payer: Self-pay | Admitting: Internal Medicine

## 2024-07-15 ENCOUNTER — Encounter

## 2024-09-11 ENCOUNTER — Ambulatory Visit

## 2024-10-14 ENCOUNTER — Encounter

## 2025-01-13 ENCOUNTER — Encounter

## 2025-04-14 ENCOUNTER — Encounter

## 2025-07-14 ENCOUNTER — Encounter
